# Patient Record
Sex: Male | Born: 1990
Health system: Southern US, Community
[De-identification: ages and names within clinical notes are randomized; demographics above are authoritative.]

## PROBLEM LIST (undated history)

## (undated) ENCOUNTER — Emergency Department (HOSPITAL_COMMUNITY): Admission: EM | Payer: Medicare Other | Source: Home / Self Care

## (undated) ENCOUNTER — Ambulatory Visit (HOSPITAL_COMMUNITY): Discharge status: Absent without leave | Payer: Medicare Other

## (undated) ENCOUNTER — Emergency Department (HOSPITAL_COMMUNITY): Payer: Medicare Other | Source: Home / Self Care

## (undated) DIAGNOSIS — I2699 Other pulmonary embolism without acute cor pulmonale: Secondary | ICD-10-CM

## (undated) DIAGNOSIS — G35 Multiple sclerosis: Secondary | ICD-10-CM

## (undated) DIAGNOSIS — F909 Attention-deficit hyperactivity disorder, unspecified type: Secondary | ICD-10-CM

## (undated) DIAGNOSIS — G8929 Other chronic pain: Secondary | ICD-10-CM

## (undated) DIAGNOSIS — M542 Cervicalgia: Secondary | ICD-10-CM

## (undated) DIAGNOSIS — I639 Cerebral infarction, unspecified: Secondary | ICD-10-CM

## (undated) DIAGNOSIS — M549 Dorsalgia, unspecified: Secondary | ICD-10-CM

## (undated) DIAGNOSIS — K5909 Other constipation: Secondary | ICD-10-CM

## (undated) DIAGNOSIS — F319 Bipolar disorder, unspecified: Secondary | ICD-10-CM

## (undated) DIAGNOSIS — F209 Schizophrenia, unspecified: Secondary | ICD-10-CM

## (undated) DIAGNOSIS — I10 Essential (primary) hypertension: Secondary | ICD-10-CM

## (undated) DIAGNOSIS — Z91199 Patient's noncompliance with other medical treatment and regimen due to unspecified reason: Secondary | ICD-10-CM

## (undated) DIAGNOSIS — E669 Obesity, unspecified: Secondary | ICD-10-CM

## (undated) DIAGNOSIS — Z9119 Patient's noncompliance with other medical treatment and regimen: Secondary | ICD-10-CM

## (undated) HISTORY — DX: Multiple sclerosis: G35

## (undated) HISTORY — PX: NO PAST SURGERIES: SHX2092

## (undated) HISTORY — DX: Obesity, unspecified: E66.9

## (undated) HISTORY — PX: OTHER SURGICAL HISTORY: SHX169

---
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
## 2011-01-28 ENCOUNTER — Inpatient Hospital Stay (HOSPITAL_COMMUNITY)
Admission: EM | Admit: 2011-01-28 | Discharge: 2011-02-01 | DRG: 071 | Disposition: A | Discharge status: Home / Self Care | Payer: Medicaid Other | Attending: Internal Medicine | Admitting: Internal Medicine

## 2011-01-28 ENCOUNTER — Emergency Department (HOSPITAL_COMMUNITY): Payer: Medicaid Other

## 2011-01-28 DIAGNOSIS — F39 Unspecified mood [affective] disorder: Secondary | ICD-10-CM | POA: Diagnosis present

## 2011-01-28 DIAGNOSIS — F1210 Cannabis abuse, uncomplicated: Secondary | ICD-10-CM | POA: Diagnosis present

## 2011-01-28 DIAGNOSIS — R930 Abnormal findings on diagnostic imaging of skull and head, not elsewhere classified: Secondary | ICD-10-CM | POA: Diagnosis present

## 2011-01-28 DIAGNOSIS — M6282 Rhabdomyolysis: Secondary | ICD-10-CM | POA: Diagnosis present

## 2011-01-28 DIAGNOSIS — F29 Unspecified psychosis not due to a substance or known physiological condition: Secondary | ICD-10-CM | POA: Diagnosis present

## 2011-01-28 DIAGNOSIS — R82998 Other abnormal findings in urine: Secondary | ICD-10-CM | POA: Diagnosis present

## 2011-01-28 DIAGNOSIS — G9340 Encephalopathy, unspecified: Principal | ICD-10-CM | POA: Diagnosis present

## 2011-01-28 DIAGNOSIS — F988 Other specified behavioral and emotional disorders with onset usually occurring in childhood and adolescence: Secondary | ICD-10-CM | POA: Diagnosis present

## 2011-01-28 DIAGNOSIS — E860 Dehydration: Secondary | ICD-10-CM | POA: Diagnosis present

## 2011-01-28 DIAGNOSIS — F319 Bipolar disorder, unspecified: Secondary | ICD-10-CM | POA: Diagnosis present

## 2011-01-28 DIAGNOSIS — E876 Hypokalemia: Secondary | ICD-10-CM | POA: Diagnosis present

## 2011-01-28 LAB — URINALYSIS, ROUTINE W REFLEX MICROSCOPIC
Glucose, UA: NEGATIVE mg/dL
Ketones, ur: 15 mg/dL — AB
Nitrite: NEGATIVE
Protein, ur: 100 mg/dL — AB
Specific Gravity, Urine: 1.028 (ref 1.005–1.030)
Urobilinogen, UA: 1 mg/dL (ref 0.0–1.0)
pH: 6 (ref 5.0–8.0)

## 2011-01-28 LAB — HEMOGLOBIN A1C
Hgb A1c MFr Bld: 5.9 % — ABNORMAL HIGH (ref <5.7)
Mean Plasma Glucose: 123 mg/dL — ABNORMAL HIGH (ref <117)

## 2011-01-28 LAB — RAPID URINE DRUG SCREEN, HOSP PERFORMED
Amphetamines: NOT DETECTED
Barbiturates: NOT DETECTED
Benzodiazepines: NOT DETECTED
Cocaine: NOT DETECTED
Opiates: NOT DETECTED
Tetrahydrocannabinol: POSITIVE — AB

## 2011-01-28 LAB — DIFFERENTIAL
Basophils Absolute: 0 10*3/uL (ref 0.0–0.1)
Basophils Relative: 0 % (ref 0–1)
Eosinophils Absolute: 0 10*3/uL (ref 0.0–0.7)
Eosinophils Relative: 0 % (ref 0–5)
Lymphocytes Relative: 12 % (ref 12–46)
Lymphs Abs: 1.4 10*3/uL (ref 0.7–4.0)
Monocytes Absolute: 0.9 10*3/uL (ref 0.1–1.0)
Monocytes Relative: 7 % (ref 3–12)
Neutro Abs: 10.1 10*3/uL — ABNORMAL HIGH (ref 1.7–7.7)
Neutrophils Relative %: 81 % — ABNORMAL HIGH (ref 43–77)

## 2011-01-28 LAB — COMPREHENSIVE METABOLIC PANEL
ALT: 44 U/L (ref 0–53)
AST: 23 U/L (ref 0–37)
Albumin: 4.7 g/dL (ref 3.5–5.2)
Alkaline Phosphatase: 125 U/L — ABNORMAL HIGH (ref 39–117)
BUN: 9 mg/dL (ref 6–23)
CO2: 22 mEq/L (ref 19–32)
Calcium: 10.4 mg/dL (ref 8.4–10.5)
Chloride: 102 mEq/L (ref 96–112)
Creatinine, Ser: 0.88 mg/dL (ref 0.50–1.35)
GFR calc Af Amer: >60 mL/min (ref >60)
GFR calc non Af Amer: >60 mL/min (ref >60)
Glucose, Bld: 114 mg/dL — ABNORMAL HIGH (ref 70–99)
Potassium: 3.1 mEq/L — ABNORMAL LOW (ref 3.5–5.1)
Sodium: 139 mEq/L (ref 135–145)
Total Bilirubin: 0.9 mg/dL (ref 0.3–1.2)
Total Protein: 8.4 g/dL — ABNORMAL HIGH (ref 6.0–8.3)

## 2011-01-28 LAB — URINE MICROSCOPIC-ADD ON

## 2011-01-28 LAB — CBC
HCT: 41.7 % (ref 39.0–52.0)
Hemoglobin: 13.4 g/dL (ref 13.0–17.0)
MCH: 25.1 pg — ABNORMAL LOW (ref 26.0–34.0)
MCHC: 32.1 g/dL (ref 30.0–36.0)
MCV: 78.2 fL (ref 78.0–100.0)
Platelets: 224 10*3/uL (ref 150–400)
RBC: 5.33 MIL/uL (ref 4.22–5.81)
RDW: 14 % (ref 11.5–15.5)
WBC: 12.4 10*3/uL — ABNORMAL HIGH (ref 4.0–10.5)

## 2011-01-28 LAB — PROTIME-INR
INR: 1.16 (ref 0.00–1.49)
Prothrombin Time: 15 seconds (ref 11.6–15.2)

## 2011-01-28 LAB — APTT: aPTT: 29 seconds (ref 24–37)

## 2011-01-28 LAB — ETHANOL: Alcohol, Ethyl (B): <11 mg/dL (ref 0–11)

## 2011-01-28 LAB — MRSA PCR SCREENING: MRSA by PCR: NEGATIVE

## 2011-01-28 LAB — SALICYLATE LEVEL: Salicylate Lvl: <2 mg/dL — ABNORMAL LOW (ref 2.8–20.0)

## 2011-01-28 LAB — CK: Total CK: 1120 U/L — ABNORMAL HIGH (ref 7–232)

## 2011-01-28 LAB — ACETAMINOPHEN LEVEL: Acetaminophen (Tylenol), Serum: <15 ug/mL (ref 10–30)

## 2011-01-29 ENCOUNTER — Inpatient Hospital Stay (HOSPITAL_COMMUNITY): Payer: Medicaid Other

## 2011-01-29 DIAGNOSIS — G459 Transient cerebral ischemic attack, unspecified: Secondary | ICD-10-CM

## 2011-01-29 DIAGNOSIS — R000 Tachycardia, unspecified: Secondary | ICD-10-CM

## 2011-01-29 DIAGNOSIS — R4182 Altered mental status, unspecified: Secondary | ICD-10-CM

## 2011-01-29 LAB — COMPREHENSIVE METABOLIC PANEL
ALT: 39 U/L (ref 0–53)
AST: 23 U/L (ref 0–37)
Albumin: 3.7 g/dL (ref 3.5–5.2)
Alkaline Phosphatase: 109 U/L (ref 39–117)
BUN: 7 mg/dL (ref 6–23)
CO2: 24 mEq/L (ref 19–32)
Calcium: 9.4 mg/dL (ref 8.4–10.5)
Chloride: 100 mEq/L (ref 96–112)
Creatinine, Ser: 0.71 mg/dL (ref 0.50–1.35)
GFR calc Af Amer: >60 mL/min (ref >60)
GFR calc non Af Amer: >60 mL/min (ref >60)
Glucose, Bld: 93 mg/dL (ref 70–99)
Potassium: 3 mEq/L — ABNORMAL LOW (ref 3.5–5.1)
Sodium: 136 mEq/L (ref 135–145)
Total Bilirubin: 1.4 mg/dL — ABNORMAL HIGH (ref 0.3–1.2)
Total Protein: 7.4 g/dL (ref 6.0–8.3)

## 2011-01-29 LAB — CBC
HCT: 40.2 % (ref 39.0–52.0)
Hemoglobin: 12.8 g/dL — ABNORMAL LOW (ref 13.0–17.0)
MCH: 25 pg — ABNORMAL LOW (ref 26.0–34.0)
MCHC: 31.8 g/dL (ref 30.0–36.0)
MCV: 78.5 fL (ref 78.0–100.0)
Platelets: 195 10*3/uL (ref 150–400)
RBC: 5.12 MIL/uL (ref 4.22–5.81)
RDW: 14 % (ref 11.5–15.5)
WBC: 8.7 10*3/uL (ref 4.0–10.5)

## 2011-01-29 LAB — LIPID PANEL
Cholesterol: 113 mg/dL (ref 0–169)
HDL: 33 mg/dL — ABNORMAL LOW (ref >34)
LDL Cholesterol: 70 mg/dL (ref 0–109)
Total CHOL/HDL Ratio: 3.4 RATIO
Triglycerides: 52 mg/dL (ref <150)
VLDL: 10 mg/dL (ref 0–40)
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
## 2011-01-30 LAB — BASIC METABOLIC PANEL
BUN: 8 mg/dL (ref 6–23)
CO2: 26 mEq/L (ref 19–32)
Calcium: 9.1 mg/dL (ref 8.4–10.5)
Chloride: 106 mEq/L (ref 96–112)
Creatinine, Ser: 0.86 mg/dL (ref 0.50–1.35)
GFR calc Af Amer: >60 mL/min (ref >60)
GFR calc non Af Amer: >60 mL/min (ref >60)
Glucose, Bld: 93 mg/dL (ref 70–99)
Potassium: 3.4 mEq/L — ABNORMAL LOW (ref 3.5–5.1)
Sodium: 142 mEq/L (ref 135–145)

## 2011-01-30 LAB — CBC
HCT: 40.9 % (ref 39.0–52.0)
Hemoglobin: 12.6 g/dL — ABNORMAL LOW (ref 13.0–17.0)
MCH: 24.5 pg — ABNORMAL LOW (ref 26.0–34.0)
MCHC: 30.8 g/dL (ref 30.0–36.0)
MCV: 79.4 fL (ref 78.0–100.0)
Platelets: 198 10*3/uL (ref 150–400)
RBC: 5.15 MIL/uL (ref 4.22–5.81)
RDW: 14.2 % (ref 11.5–15.5)
WBC: 6.2 10*3/uL (ref 4.0–10.5)

## 2011-01-30 LAB — URINE CULTURE
Colony Count: 15000
Culture  Setup Time: 201207251054
Special Requests: NEGATIVE

## 2011-01-30 LAB — MAGNESIUM: Magnesium: 1.9 mg/dL (ref 1.5–2.5)
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
## 2011-02-01 DIAGNOSIS — F39 Unspecified mood [affective] disorder: Secondary | ICD-10-CM

## 2011-02-02 NOTE — Discharge Summary (Signed)
NAMEJORDIN, [name]              ACCOUNT NO.:  [id number]  MEDICAL RECORD NO.:  [id number]  LOCATION:  1514                         FACILITY:  [facility]  PHYSICIAN:  Baltazar Najjar, MD     DATE OF BIRTH:  February 19, 1991  DATE OF ADMISSION:  01/28/2011 DATE OF DISCHARGE:                              DISCHARGE SUMMARY   FINAL DISCHARGE DIAGNOSES: 1. 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 2. Mood disorder/encephalopathy. 3. History of bipolar disorder. 4. Pyuria with polymicrobial growth in urine cultures, asymptomatic. 5. Morbid obesity. 6. Abnormal CT findings, stroke was ruled out by MRI.  CONSULTANTS: 1. Neurology service, neuro hospital service. 2. Eulogio Ditch, MD from Psychiatry.  RADIOLOGY AND IMAGING STUDIES: 1. Head CT January 28, 2011 showed small low-attenuation focal     subcortically in both parietal regions, worrisome for possible     subcortical infarct. 2. MRI of brain showed diffusion weight imaging, demonstrated no     evidence for acute or subacute infarction.  Examination     discontinued prior to completion due to patient intolerance.  BRIEF ADMITTING HISTORY:  Please refer to H&P for more details.  In summary, [name] is an 20 year old morbidly obese African- American man who was brought into the ER after he was found wandering on the streets and confused and with altered mental status.  The patient was also paranoid and stated that somebody put something in his drink. The patient was also noted to have been smoking marijuana, another recreational drug which was nonspecified.  Please refer to H&P for more details.  HOSPITAL COURSE:  In the emergency room, the patient was very combative. He was requiring mechanical restraints.  Initial workup in the ED including CT scan of the head as above showed findings suggestive of possible subcortical infarct.  The patient was initially admitted to ICU and stabilized for his  agitation.  He was seen by neurology service as well as psychiatric service.  He was initially placed on aspirin and workup initiated to rule out stroke that was ruled out by MRI as above. The patient also on exam did not have any neurological findings to suggest stroke.  His urinalysis showed moderate leukocytes and 11 to 20 wbc's.  Urine culture was sent that showed polymicrobial organisms.  The patient, after stabilizing and less agitated, reported no symptoms to suggest UTI.  The patient was initially covered empirically with Rocephin.  However, that will be discontinued today.  Next, the patient was seen by Dr. Rogers Blocker who put him on Seroquel and the patient is currently being transferred to inpatient psych facility for further psychiatric management.  Next, hypokalemia, the patient's potassium is being repleted.  His potassium level today has improved to 3.4.  I will give him an extra dose of potassium with potassium chloride and before discharge, his magnesium level is within normal range.  DISCHARGE MEDICATIONS: 1. Haloperidol 5 mg tablets, 10 mg p.o. q.6h. p.r.n. 2. Seroquel 100 mg p.o. q.h.s. and 100 mg p.o. twice daily with meals.  DISCHARGE INSTRUCTIONS:  The patient will be discharged to inpatient psych facility.  The patient to follow with his PCP upon discharge from there.  DISCHARGE CONDITION:  Stable medically.          ______________________________ Baltazar Najjar, MD     SA/MEDQ  D:  01/30/2011  T:  01/30/2011  Job:  [id number]  Electronically Signed by Hannah Beat MD on 02/02/2011 01:21:41 PM

## 2011-02-03 NOTE — Discharge Summary (Signed)
  NAMEGREOGORY, CORNETTE              ACCOUNT NO.:  [id number]  MEDICAL RECORD NO.:  [id number]  LOCATION:  1514                         FACILITY:  [facility]  PHYSICIAN:  Baltazar Najjar, MD     DATE OF BIRTH:  1991-07-03  DATE OF ADMISSION:  01/28/2011 DATE OF DISCHARGE:                              DISCHARGE SUMMARY   ADDENDUM.  HOSPITAL COURSE:  The patient remained stable and calm , psychiatric service was reconsulted for the reevaluation.  The patient was seen by psychiatrist on-call today who recommended no acute psych hospitalization and to discontinue one-to-one observation and stated that the patient does not need to be involuntary committed and he is stable for discharge with followup as an outpatient. This plan was discussed with his mother and Child psychotherapist . The patient will be discharged today.   Voice message left for the mother for her to call me back to update her with the above.  As per psych note, they already discussed the plan with his mother.          ______________________________ Baltazar Najjar, MD     SA/MEDQ  D:  02/01/2011  T:  02/01/2011  Job:  [id number]  Electronically Signed by Hannah Beat MD on 02/03/2011 03:20:43 PM

## 2011-07-02 ENCOUNTER — Observation Stay (HOSPITAL_COMMUNITY)
Admission: EM | Admit: 2011-07-02 | Discharge: 2011-07-04 | Disposition: A | Discharge status: Home / Self Care | Payer: Medicaid Other | Attending: Internal Medicine | Admitting: Internal Medicine

## 2011-07-02 ENCOUNTER — Encounter: Payer: Self-pay | Admitting: Emergency Medicine

## 2011-07-02 ENCOUNTER — Emergency Department (HOSPITAL_COMMUNITY): Payer: Medicaid Other

## 2011-07-02 DIAGNOSIS — R279 Unspecified lack of coordination: Principal | ICD-10-CM | POA: Insufficient documentation

## 2011-07-02 DIAGNOSIS — R531 Weakness: Secondary | ICD-10-CM

## 2011-07-02 DIAGNOSIS — E669 Obesity, unspecified: Secondary | ICD-10-CM | POA: Insufficient documentation

## 2011-07-02 DIAGNOSIS — R270 Ataxia, unspecified: Secondary | ICD-10-CM

## 2011-07-02 DIAGNOSIS — R269 Unspecified abnormalities of gait and mobility: Secondary | ICD-10-CM

## 2011-07-02 DIAGNOSIS — F259 Schizoaffective disorder, unspecified: Secondary | ICD-10-CM | POA: Diagnosis present

## 2011-07-02 DIAGNOSIS — I10 Essential (primary) hypertension: Secondary | ICD-10-CM

## 2011-07-02 DIAGNOSIS — F209 Schizophrenia, unspecified: Secondary | ICD-10-CM | POA: Insufficient documentation

## 2011-07-02 DIAGNOSIS — R42 Dizziness and giddiness: Secondary | ICD-10-CM | POA: Insufficient documentation

## 2011-07-02 DIAGNOSIS — F909 Attention-deficit hyperactivity disorder, unspecified type: Secondary | ICD-10-CM | POA: Insufficient documentation

## 2011-07-02 DIAGNOSIS — F319 Bipolar disorder, unspecified: Secondary | ICD-10-CM | POA: Insufficient documentation

## 2011-07-02 HISTORY — DX: Bipolar disorder, unspecified: F31.9

## 2011-07-02 HISTORY — DX: Attention-deficit hyperactivity disorder, unspecified type: F90.9

## 2011-07-02 HISTORY — DX: Essential (primary) hypertension: I10

## 2011-07-02 HISTORY — DX: Schizophrenia, unspecified: F20.9

## 2011-07-02 LAB — URINALYSIS, ROUTINE W REFLEX MICROSCOPIC
Bilirubin Urine: NEGATIVE
Glucose, UA: NEGATIVE mg/dL
Hgb urine dipstick: NEGATIVE
Ketones, ur: NEGATIVE mg/dL
Nitrite: NEGATIVE
Protein, ur: NEGATIVE mg/dL
Specific Gravity, Urine: 1.027 (ref 1.005–1.030)
Urobilinogen, UA: 1 mg/dL (ref 0.0–1.0)
pH: 6.5 (ref 5.0–8.0)

## 2011-07-02 LAB — CBC
HCT: 42.4 % (ref 39.0–52.0)
Hemoglobin: 13.8 g/dL (ref 13.0–17.0)
MCH: 25.9 pg — ABNORMAL LOW (ref 26.0–34.0)
MCHC: 32.5 g/dL (ref 30.0–36.0)
MCV: 79.7 fL (ref 78.0–100.0)
Platelets: 203 10*3/uL (ref 150–400)
RBC: 5.32 MIL/uL (ref 4.22–5.81)
RDW: 13.2 % (ref 11.5–15.5)
WBC: 6.2 10*3/uL (ref 4.0–10.5)

## 2011-07-02 LAB — BASIC METABOLIC PANEL
BUN: 11 mg/dL (ref 6–23)
CO2: 27 mEq/L (ref 19–32)
Calcium: 9.8 mg/dL (ref 8.4–10.5)
Chloride: 102 mEq/L (ref 96–112)
Creatinine, Ser: 0.93 mg/dL (ref 0.50–1.35)
GFR calc Af Amer: >90 mL/min (ref >90)
GFR calc non Af Amer: >90 mL/min (ref >90)
Glucose, Bld: 85 mg/dL (ref 70–99)
Potassium: 3.7 mEq/L (ref 3.5–5.1)
Sodium: 139 mEq/L (ref 135–145)

## 2011-07-02 LAB — URINE MICROSCOPIC-ADD ON

## 2011-07-02 NOTE — ED Notes (Signed)
Pt ambulated and experienced weakness in L leg to the point where we needed to give him a seat.  Pt rested for 3 minutes and was able to ambulate again.

## 2011-07-02 NOTE — ED Notes (Signed)
Mother's # Brandon [name]) number [phone] H [phone] Dossie Arbour (grandmother/caregiver).

## 2011-07-02 NOTE — ED Notes (Signed)
Per ems- Pt coming from home since Saturday he has been feeling dizzy and today has been falling.

## 2011-07-02 NOTE — ED Notes (Signed)
Phlebotomy at bedside.  Notified pt we will ambulate once blood draw is finished.  Pt compliant and has no complaints at this time

## 2011-07-02 NOTE — ED Notes (Signed)
Dr Le at bedside.  

## 2011-07-02 NOTE — ED Provider Notes (Signed)
History     CSN: [id number]  Arrival date & time 07/02/11  1648   First MD Initiated Contact with Patient 07/02/11 1750      Chief Complaint  Patient presents with  . Dizziness    (Consider location/radiation/quality/duration/timing/severity/associated sxs/prior treatment) Patient is a 20 y.o. male presenting with weakness. The history is provided by the patient.  Weakness The primary symptoms include dizziness and focal weakness. Primary symptoms do not include headaches, syncope, paresthesias, loss of sensation, fever or nausea. The symptoms began 3 to 5 days ago.  Dizziness also occurs with weakness. Dizziness does not occur with nausea.   Additional symptoms include weakness.  Per family, pt with progressive weakness over last 4 days, today fell several times. Per family pt has hx of stroke, per previous chart review, unclear if Stroke has ever been confirmed, MRI was unremarkable. Pt denies any complaints other then, states, when he stands up, "my legs just collapse." Denies fever, chills, URI symptoms, back pain, abdominal pain.   Past Medical History  Diagnosis Date  . ADHD (attention deficit hyperactivity disorder)   . Hypertension   . Schizophrenia   . Bipolar 1 disorder     No past surgical history on file.  No family history on file.  History  Substance Use Topics  . Smoking status: Not on file  . Smokeless tobacco: Not on file  . Alcohol Use:       Review of Systems  Constitutional: Positive for activity change. Negative for fever and chills.  HENT: Negative for neck pain.   Eyes: Negative.   Respiratory: Negative.   Cardiovascular: Negative.  Negative for syncope.  Gastrointestinal: Negative for nausea.  Genitourinary: Negative.   Musculoskeletal: Negative.   Neurological: Positive for dizziness, focal weakness, weakness and light-headedness. Negative for headaches and paresthesias.  Psychiatric/Behavioral: Positive for confusion and decreased  concentration.    Allergies  Review of patient's allergies indicates no known allergies.  Home Medications   Current Outpatient Rx  Name Route Sig Dispense Refill  . HALOPERIDOL 5 MG PO TABS Oral Take 5 mg by mouth 2 (two) times daily.      . QUETIAPINE FUMARATE 300 MG PO TB24 Oral Take 600 mg by mouth at bedtime.        BP 143/101  Pulse 85  Temp(Src) 97.9 F (36.6 C) (Oral)  Resp 16  SpO2 100%  Physical Exam  Nursing note and vitals reviewed. Constitutional: He is oriented to person, place, and time. He appears well-developed and well-nourished. No distress.  HENT:  Head: Normocephalic and atraumatic.  Eyes: Pupils are equal, round, and reactive to light.  Neck: Neck supple.  Cardiovascular: Normal rate and regular rhythm.   Pulmonary/Chest: Effort normal and breath sounds normal. No respiratory distress.  Abdominal: Soft. Bowel sounds are normal. He exhibits no distension.  Musculoskeletal: Normal range of motion.  Neurological: He is alert and oriented to person, place, and time.       Gait is shuffled, 5/5 and equal bilateral LE strength. Poor coordination, pt unable to do finger to nose test. No pronator drift.   Skin: Skin is warm and dry. No erythema.  Psychiatric: He has a normal mood and affect.    ED Course  Procedures (including critical care time)  Labs Reviewed  CBC - Abnormal; Notable for the following:    MCH 25.9 (*)    All other components within normal limits  BASIC METABOLIC PANEL  URINALYSIS, ROUTINE W REFLEX MICROSCOPIC   Ct  Head Wo Contrast  07/02/2011  *RADIOLOGY REPORT*  Clinical Data: Dizziness.  Recurrent falls.  CT HEAD WITHOUT CONTRAST  Technique:  Contiguous axial images were obtained from the base of the skull through the vertex without contrast.  Comparison: 01/28/2011  Findings: There is no evidence of intracranial hemorrhage, brain edema or other signs of acute infarction.  There is no evidence of intracranial mass lesion or mass  effect.  No abnormal extra-axial fluid collections are identified.  No evidence of hydrocephalus.  Mild cerebral atrophy and chronic small vessel disease again noted.  No evidence of skull fracture or other significant bone abnormality.  IMPRESSION:  1.  No acute intracranial abnormality. 2.  Stable mild cerebral atrophy and chronic small vessel disease.  Original Report Authenticated By: Danae Orleans, M.D.     No diagnosis found.  Pt ambulated in hallway, very unsteady gait. Family uncomfortable taking him home. Spoke with [facility], will admit for further evaluation and possible palcement.   MDM          Lottie Mussel, PA 07/03/11 [phone]

## 2011-07-03 ENCOUNTER — Inpatient Hospital Stay (HOSPITAL_COMMUNITY): Payer: Medicaid Other

## 2011-07-03 ENCOUNTER — Encounter (HOSPITAL_COMMUNITY): Payer: Self-pay

## 2011-07-03 DIAGNOSIS — R269 Unspecified abnormalities of gait and mobility: Secondary | ICD-10-CM

## 2011-07-03 LAB — BASIC METABOLIC PANEL
BUN: 11 mg/dL (ref 6–23)
CO2: 26 mEq/L (ref 19–32)
Calcium: 9.7 mg/dL (ref 8.4–10.5)
Chloride: 101 mEq/L (ref 96–112)
Creatinine, Ser: 0.86 mg/dL (ref 0.50–1.35)
GFR calc Af Amer: >90 mL/min (ref >90)
GFR calc non Af Amer: >90 mL/min (ref >90)
Glucose, Bld: 147 mg/dL — ABNORMAL HIGH (ref 70–99)
Potassium: 3.6 mEq/L (ref 3.5–5.1)
Sodium: 139 mEq/L (ref 135–145)

## 2011-07-03 LAB — CBC
HCT: 42.3 % (ref 39.0–52.0)
HCT: 41.1 % (ref 39.0–52.0)
Hemoglobin: 14.2 g/dL (ref 13.0–17.0)
Hemoglobin: 13.2 g/dL (ref 13.0–17.0)
MCH: 26.7 pg (ref 26.0–34.0)
MCH: 25.7 pg — ABNORMAL LOW (ref 26.0–34.0)
MCHC: 33.6 g/dL (ref 30.0–36.0)
MCHC: 32.1 g/dL (ref 30.0–36.0)
MCV: 79.7 fL (ref 78.0–100.0)
MCV: 80.1 fL (ref 78.0–100.0)
Platelets: 215 10*3/uL (ref 150–400)
Platelets: 206 10*3/uL (ref 150–400)
RBC: 5.31 MIL/uL (ref 4.22–5.81)
RBC: 5.13 MIL/uL (ref 4.22–5.81)
RDW: 13.1 % (ref 11.5–15.5)
RDW: 13.3 % (ref 11.5–15.5)
WBC: 6.4 10*3/uL (ref 4.0–10.5)
WBC: 6.1 10*3/uL (ref 4.0–10.5)

## 2011-07-03 LAB — RAPID URINE DRUG SCREEN, HOSP PERFORMED
Amphetamines: NOT DETECTED
Barbiturates: NOT DETECTED
Benzodiazepines: NOT DETECTED
Cocaine: NOT DETECTED
Opiates: NOT DETECTED
Tetrahydrocannabinol: NOT DETECTED

## 2011-07-03 LAB — CREATININE, SERUM
Creatinine, Ser: 0.97 mg/dL (ref 0.50–1.35)
GFR calc Af Amer: >90 mL/min (ref >90)
GFR calc non Af Amer: >90 mL/min (ref >90)

## 2011-07-03 LAB — VITAMIN B12: Vitamin B-12: 696 pg/mL (ref 211–911)

## 2011-07-03 LAB — ETHANOL: Alcohol, Ethyl (B): <11 mg/dL (ref 0–11)

## 2011-07-03 LAB — RPR: RPR Ser Ql: NONREACTIVE

## 2011-07-03 LAB — HEMOGLOBIN A1C
Hgb A1c MFr Bld: 5.7 % — ABNORMAL HIGH (ref <5.7)
Mean Plasma Glucose: 117 mg/dL — ABNORMAL HIGH (ref <117)

## 2011-07-03 LAB — TSH: TSH: 1.854 u[IU]/mL (ref 0.350–4.500)

## 2011-07-03 MED ORDER — QUETIAPINE FUMARATE ER 300 MG PO TB24
600.0000 mg | ORAL_TABLET | Freq: Every day | ORAL | Status: DC
  Administered 2011-07-03: 600 mg via ORAL
  Filled 2011-07-03 (×2): qty 2

## 2011-07-03 MED ORDER — ENOXAPARIN SODIUM 40 MG/0.4ML ~~LOC~~ SOLN
40.0000 mg | SUBCUTANEOUS | Status: DC
  Administered 2011-07-03 – 2011-07-04 (×2): 40 mg via SUBCUTANEOUS
  Filled 2011-07-03 (×2): qty 0.4

## 2011-07-03 MED ORDER — HALOPERIDOL 5 MG PO TABS
5.0000 mg | ORAL_TABLET | Freq: Two times a day (BID) | ORAL | Status: DC
  Administered 2011-07-03 – 2011-07-04 (×4): 5 mg via ORAL
  Filled 2011-07-03 (×5): qty 1

## 2011-07-03 NOTE — ED Notes (Signed)
Pt found up and walking to bathroom.  Returned to bed and given urinal.

## 2011-07-03 NOTE — Progress Notes (Addendum)
Physical Therapy Evaluation Patient Details Name: Brandon [name] MRN: [id number] DOB: 02/02/1991 Today's Date: 07/03/2011  Problem List:  Patient Active Problem List  Diagnoses  . OBESITY  . ADHD  . Ataxia    Past Medical History:  Past Medical History  Diagnosis Date  . ADHD (attention deficit hyperactivity disorder)   . Hypertension   . Schizophrenia   . Bipolar 1 disorder    Past Surgical History: No past surgical history on file.  PT Assessment/Plan/Recommendation PT Assessment Clinical Impression Statement: Pt is a 20 y/o male admitted with ataxic gait along with the below PT problem list.  Pt would benefit from acute PT to maximize independence and facilitate d/c home with outpatient PT to follow up with pt. PT Recommendation/Assessment: Patient will need skilled PT in the acute care venue PT Problem List: Decreased activity tolerance;Decreased balance;Decreased mobility;Decreased cognition;Decreased safety awareness Barriers to Discharge: None PT Therapy Diagnosis : Difficulty walking PT Plan PT Frequency: Min 3X/week PT Treatment/Interventions: DME instruction;Gait training;Functional mobility training;Therapeutic activities;Balance training;Patient/family education;Stair training PT Recommendation Follow Up Recommendations: Outpatient PT ([facility] if not able to have transportation to Outpatient PT) Equipment Recommended: Rolling walker with 5" wheels PT Goals  Acute Rehab PT Goals PT Goal Formulation: With patient Time For Goal Achievement: 7 days Pt will go Supine/Side to Sit: with modified independence PT Goal: Supine/Side to Sit - Progress: Not met Pt will go Sit to Supine/Side: with modified independence PT Goal: Sit to Supine/Side - Progress: Not met Pt will go Sit to Stand: with modified independence PT Goal: Sit to Stand - Progress: Not met Pt will go Stand to Sit: with modified independence PT Goal: Stand to Sit - Progress: Not met Pt will Ambulate:  >150 feet;with modified independence;with least restrictive assistive device PT Goal: Ambulate - Progress: Not met Pt will Go Up / Down Stairs: 3-5 stairs;with modified independence;with least restrictive assistive device PT Goal: Up/Down Stairs - Progress: Not met  PT Evaluation Precautions/Restrictions  Precautions Precautions: Fall Required Braces or Orthoses: No Restrictions Weight Bearing Restrictions: No Prior Functioning  Home Living Lives With: Family (Mom and brother.  ) Brandon [name] Help From: Family Type of Home: House Home Layout: One level Home Access: Stairs to enter Entrance Stairs-Rails:  (Unsure) Entrance Stairs-Number of Steps:  (Unsure) Home Adaptive Equipment: None Prior Function Level of Independence: Independent with gait;Independent with transfers;Needs assistance with ADLs;Needs assistance with homemaking Able to Take Stairs?: Yes Driving: No Cognition Cognition Arousal/Alertness: Awake/alert Overall Cognitive Status: Impaired Orientation Level: Oriented X4 Cognition - Other Comments: Pt slow to process information with difficulty problem solving. Sensation/Coordination Sensation Light Touch: Appears Intact Stereognosis: Not tested Hot/Cold: Not tested Proprioception: Not tested Coordination Gross Motor Movements are Fluid and Coordinated: Yes Fine Motor Movements are Fluid and Coordinated: Not tested Heel Shin Test: No ataxia noted with testing right or left. Extremity Assessment RUE Assessment RUE Assessment: Within Functional Limits LUE Assessment LUE Assessment: Within Functional Limits RLE Assessment RLE Assessment: Within Functional Limits LLE Assessment LLE Assessment: Within Functional Limits Pain 0/10 with treatment. Mobility (including Balance) Bed Mobility Bed Mobility: Yes Supine to Sit: 5: Supervision;HOB flat Supine to Sit Details (indicate cue type and reason): Verbal cues for sequence.  Several trials secondary to pt would  not staying sitting EOB for a prolonged amount of time. Sitting - Scoot to Edge of Bed: 5: Supervision Sitting - Scoot to [place] of Bed Details (indicate cue type and reason): Verbal cues to keep trunk anterior over BOS with pt tending  to lean posterior. Sit to Supine - Right: 5: Supervision;HOB flat Sit to Supine - Right Details (indicate cue type and reason): Verbal cues for safety.  Attempting to remain sitting EOB, but pt continually laying back down even with cues not to lay down. Transfers Transfers: Yes Sit to Stand: 1: +2 Total assist;Patient percentage (comment);From bed ((pt=80%); 2 trials.) Sit to Stand Details (indicate cue type and reason): Assist for balance and safety.  Pt with impulsive behavior limiting safety.  Cues for hand placement and safety with standing. Stand to Sit: 1: +2 Total assist;Patient percentage (comment);To bed;To chair/3-in-1;With upper extremity assist ((pt=80%); 2 trials.) Stand to Sit Details: Assist to slow descent to chair with cues for hand placement and safety.  Impulsive behavior causes pt to sit without warning. Ambulation/Gait Ambulation/Gait: Yes Ambulation/Gait Assistance: 1: +2 Total assist;Patient percentage (comment) ((pt=85%)) Ambulation/Gait Assistance Details (indicate cue type and reason): Assist for safety secondary to impulsive behavior.  Required chair follow due to decreased safety.  Assist also for slight imbalance with gait. Ambulation Distance (Feet): 120 Feet Assistive device: Rolling walker Gait Pattern: Step-through pattern;Decreased step length - right;Decreased step length - left (Increased bilateral lateral excursion with gait.) Stairs: No Wheelchair Mobility Wheelchair Mobility: No  Posture/Postural Control Posture/Postural Control: No significant limitations Balance Balance Assessed: No End of Session PT - End of Session Equipment Utilized During Treatment: Gait belt Activity Tolerance: Patient tolerated treatment  well Patient left: in chair;with call bell in reach Nurse Communication: Mobility status for transfers;Mobility status for ambulation General Behavior During Session: Brandon [name] for tasks performed Cognition: Impaired Cognitive Impairment: Pt with impulsive behavior causing decreased safety and balance.  Brandon [name] M 07/03/2011, 1:31 PM  07/03/2011 Cephus Shelling, PT, DPT [phone]

## 2011-07-03 NOTE — Consult Note (Signed)
[facility] CONSULT NOTE     Reason for Consult: ataxia and AMS CC: ataxia and AMS   HPI:    Brandon [name] is an 20 y.o. male  has a past medical history of ADHD (attention deficit hyperactivity disorder); Hypertension; Schizophrenia; and Bipolar 1 disorder. Patient is minimally talkative but able to tell me noted at 1pm Saturday he felt his legs were giving out on him and felt unsteady.  He was brought to the hospital. CT head is negative.  Patient denies any numbness, tingling, recent falls or neck injury,  He denies any speech issues.    Past Medical History  Diagnosis Date  . ADHD (attention deficit hyperactivity disorder)   . Hypertension   . Schizophrenia   . Bipolar 1 disorder     No past surgical history on file.  No family history on file.  Social History:  reports that he has never smoked. He does not have any smokeless tobacco history on file. His alcohol and drug histories not on file.  No Known Allergies  Medications:    Prior to Admission:  Prescriptions prior to admission  Medication Sig Dispense Refill  . haloperidol (HALDOL) 5 MG tablet Take 5 mg by mouth 2 (two) times daily.        . QUEtiapine (SEROQUEL XR) 300 MG 24 hr tablet Take 600 mg by mouth at bedtime.         Scheduled:   . enoxaparin  40 mg Subcutaneous Q24H  . haloperidol  5 mg Oral BID  . QUEtiapine  600 mg Oral QHS    Review of Systems - General ROS: negative for - chills, fatigue, fever or hot flashes Hematological and Lymphatic ROS: negative for - bruising, fatigue, jaundice or pallor Endocrine ROS: negative for - hair pattern changes, hot flashes, mood swings or skin changes Respiratory ROS: negative for - cough, hemoptysis, orthopnea or wheezing Cardiovascular ROS: negative for - dyspnea on exertion, orthopnea, palpitations or shortness of breath Gastrointestinal ROS: negative for - abdominal pain, appetite loss, blood in stools, diarrhea or  hematemesis Musculoskeletal ROS: negative for - joint pain, joint stiffness, joint swelling or muscle pain Neurological ROS: Please see above Dermatological ROS: negative for dry skin, pruritus and rash   Blood pressure 119/73, pulse 69, temperature 97.8 F (36.6 C), temperature source Oral, resp. rate 20, height 6\' 3"  (1.905 m), weight 160.2 kg (353 lb 2.8 oz), SpO2 98.00%.   Neurologic Examination:   Mental Status: Alert, oriented, thought content appropriate.  Speech fluent without evidence of aphasia. Able to follow 3 step commands without difficulty. Cranial Nerves: II-Visual fields grossly intact. III/IV/VI-Extraocular movements intact.  Pupils reactive bilaterally. V/VII-Smile symmetric VIII-grossly intact IX/X-normal gag XI-bilateral shoulder shrug XII-midline tongue extension Motor: 5/5 bilaterally with normal tone and bulk Sensory: Pinprick and light touch intact throughout, bilaterally, NO sensory level on exam. Deep Tendon Reflexes: 2+ and symmetric throughout Plantars: Downgoing bilaterally Cerebellar: Normal finger-to-nose, normal rapid alternating movements and normal heel-to-shin test.  Normal gait and station.     Lab Results  Component Value Date/Time   CHOL 113 01/29/2011  3:10 AM    Results for orders placed during the hospital encounter of 07/02/11 (from the past 48 hour(s))  CBC     Status: Abnormal   Collection Time   07/02/11  8:03 PM      Component Value Range  Comment   WBC 6.2  4.0 - 10.5 (K/uL)    RBC 5.32  4.22 - 5.81 (MIL/uL)    Hemoglobin 13.8  13.0 - 17.0 (g/dL)    HCT 16.1  09.6 - 04.5 (%)    MCV 79.7  78.0 - 100.0 (fL)    MCH 25.9 (*) 26.0 - 34.0 (pg)    MCHC 32.5  30.0 - 36.0 (g/dL)    RDW 40.9  81.1 - 91.4 (%)    Platelets 203  150 - 400 (K/uL)   BASIC METABOLIC PANEL     Status: Normal   Collection Time   07/02/11  8:03 PM      Component Value Range Comment   Sodium 139  135 - 145 (mEq/L)    Potassium 3.7  3.5 - 5.1 (mEq/L)     Chloride 102  96 - 112 (mEq/L)    CO2 27  19 - 32 (mEq/L)    Glucose, Bld 85  70 - 99 (mg/dL)    BUN 11  6 - 23 (mg/dL)    Creatinine, Ser 7.82  0.50 - 1.35 (mg/dL)    Calcium 9.8  8.4 - 10.5 (mg/dL)    GFR calc non Af Amer >90  >90 (mL/min)    GFR calc Af Amer >90  >90 (mL/min)   URINALYSIS, ROUTINE W REFLEX MICROSCOPIC     Status: Abnormal   Collection Time   07/02/11  8:13 PM      Component Value Range Comment   Color, Urine YELLOW  YELLOW     APPearance CLEAR  CLEAR     Specific Gravity, Urine 1.027  1.005 - 1.030     pH 6.5  5.0 - 8.0     Glucose, UA NEGATIVE  NEGATIVE (mg/dL)    Hgb urine dipstick NEGATIVE  NEGATIVE     Bilirubin Urine NEGATIVE  NEGATIVE     Ketones, ur NEGATIVE  NEGATIVE (mg/dL)    Protein, ur NEGATIVE  NEGATIVE (mg/dL)    Urobilinogen, UA 1.0  0.0 - 1.0 (mg/dL)    Nitrite NEGATIVE  NEGATIVE     Leukocytes, UA TRACE (*) NEGATIVE    URINE MICROSCOPIC-ADD ON     Status: Normal   Collection Time   07/02/11  8:13 PM      Component Value Range Comment   Squamous Epithelial / LPF RARE  RARE     WBC, UA 3-6  <3 (WBC/hpf)   CBC     Status: Normal   Collection Time   07/03/11  2:27 AM      Component Value Range Comment   WBC 6.4  4.0 - 10.5 (K/uL)    RBC 5.31  4.22 - 5.81 (MIL/uL)    Hemoglobin 14.2  13.0 - 17.0 (g/dL)    HCT 95.6  21.3 - 08.6 (%)    MCV 79.7  78.0 - 100.0 (fL)    MCH 26.7  26.0 - 34.0 (pg)    MCHC 33.6  30.0 - 36.0 (g/dL)    RDW 57.8  46.9 - 62.9 (%)    Platelets 215  150 - 400 (K/uL)   CREATININE, SERUM     Status: Normal   Collection Time   07/03/11  2:27 AM      Component Value Range Comment   Creatinine, Ser 0.97  0.50 - 1.35 (mg/dL)    GFR calc non Af Amer >90  >90 (mL/min)    GFR calc Af Amer >90  >90 (mL/min)   CBC  Status: Abnormal   Collection Time   07/03/11  6:00 AM      Component Value Range Comment   WBC 6.1  4.0 - 10.5 (K/uL)    RBC 5.13  4.22 - 5.81 (MIL/uL)    Hemoglobin 13.2  13.0 - 17.0 (g/dL)    HCT 16.1   09.6 - 04.5 (%)    MCV 80.1  78.0 - 100.0 (fL)    MCH 25.7 (*) 26.0 - 34.0 (pg)    MCHC 32.1  30.0 - 36.0 (g/dL)    RDW 40.9  81.1 - 91.4 (%)    Platelets 206  150 - 400 (K/uL)   BASIC METABOLIC PANEL     Status: Abnormal   Collection Time   07/03/11  6:00 AM      Component Value Range Comment   Sodium 139  135 - 145 (mEq/L)    Potassium 3.6  3.5 - 5.1 (mEq/L)    Chloride 101  96 - 112 (mEq/L)    CO2 26  19 - 32 (mEq/L)    Glucose, Bld 147 (*) 70 - 99 (mg/dL)    BUN 11  6 - 23 (mg/dL)    Creatinine, Ser 7.82  0.50 - 1.35 (mg/dL)    Calcium 9.7  8.4 - 10.5 (mg/dL)    GFR calc non Af Amer >90  >90 (mL/min)    GFR calc Af Amer >90  >90 (mL/min)     Ct Head Wo Contrast  07/02/2011  *RADIOLOGY REPORT*  Clinical Data: Dizziness.  Recurrent falls.  CT HEAD WITHOUT CONTRAST  Technique:  Contiguous axial images were obtained from the base of the skull through the vertex without contrast.  Comparison: 01/28/2011  Findings: There is no evidence of intracranial hemorrhage, brain edema or other signs of acute infarction.  There is no evidence of intracranial mass lesion or mass effect.  No abnormal extra-axial fluid collections are identified.  No evidence of hydrocephalus.  Mild cerebral atrophy and chronic small vessel disease again noted.  No evidence of skull fracture or other significant bone abnormality.  IMPRESSION:  1.  No acute intracranial abnormality. 2.  Stable mild cerebral atrophy and chronic small vessel disease.  Original Report Authenticated By: Danae Orleans, M.D.     Assessment/Plan:   20 YO male with onset of bilateral leg weakness and sensation of unsteady gait since Saturday.  On exam he shows no weakness, no focality and able to ambulate with assistance well.  Most likely physicogenic however agree with obtaining MRI of head.    (1) Recommend he continue PT for gait (2) Recommend he be one person assist when out of bed (3) He is to heavy for MRI table, Recommend he have CT  neck and lumbar spine   Felicie Morn PA-C [facility] [phone]  07/03/2011, 9:50 AM
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
## 2011-07-03 NOTE — H&P (Signed)
PCP:   No primary provider on file.   Chief Complaint: Unsteadiness.   HPI: Brandon [name] is an 20 y.o. male with history of schizophrenia, bipolar disorder, hypertension, ADD, brought in by his grandparents complaining that he has been unsteady in his gait. He was recently admitted for the same, and reportedly had a workup including an MRI of his brain, which was only partially completed showing no abnormality. Family reports that he has  inappropriate emotion, always laughing and thinking everything is comical. I noted that he has been on Seroquel at 300 mg per day along with Haldol 5 mg each bedtime.  Workup in the emergency room included a CT of his head which was unremarkable. Because of his ataxia, and inability to try to ambulate, hospitalist was asked to admit the patient since family cannot care for him adequately.  Rewiew of Systems:  The patient denies anorexia, fever, weight loss,, vision loss, decreased hearing, hoarseness, chest pain, syncope, dyspnea on exertion, peripheral edema, balance deficits, hemoptysis, abdominal pain, melena, hematochezia, severe indigestion/heartburn, hematuria, incontinence, genital sores, muscle weakness, suspicious skin lesions, transient blindness, depression, unusual weight change, abnormal bleeding, enlarged lymph nodes, angioedema, and breast masses.   Past Medical History  Diagnosis Date  . ADHD (attention deficit hyperactivity disorder)   . Hypertension   . Schizophrenia   . Bipolar 1 disorder     No past surgical history on file.  Medications:  HOME MEDS: Prior to Admission medications   Medication Sig Start Date End Date Taking? Authorizing Provider  haloperidol (HALDOL) 5 MG tablet Take 5 mg by mouth 2 (two) times daily.     Yes Historical Provider, MD  QUEtiapine (SEROQUEL XR) 300 MG 24 hr tablet Take 600 mg by mouth at bedtime.     Yes Historical Provider, MD     Allergies:  No Known Allergies  Social History:   does not  have a smoking history on file. He does not have any smokeless tobacco history on file. His alcohol and drug histories not on file.  Family History: No family history on file.   Physical Exam: Filed Vitals:   07/02/11 1901 07/02/11 2000 07/02/11 2153 07/03/11 0047  BP: 143/101 148/119 139/86   Pulse: 85 75 72   Temp:    97.5 F (36.4 C)  TempSrc:    Oral  Resp: 16  18   SpO2: 100% 100% 100%    Blood pressure 139/86, pulse 72, temperature 97.5 F (36.4 C), temperature source Oral, resp. rate 18, SpO2 100.00%.  GEN:  Pleasant  person lying in the stretcher in no acute distress; cooperative with exam.   PSYCH:  alert and oriented x4; does not appear anxious does not appear depressed; but with clear inappropriate affect. He thinks everything is comical, and laugh at inappropriate things HEENT: Mucous membranes pink and anicteric; PERRLA; EOM intact; no cervical lymphadenopathy nor thyromegaly or carotid bruit; no JVD; Breasts:: Not examined CHEST WALL: No tenderness CHEST: Normal respiration, clear to auscultation bilaterally HEART: Regular rate and rhythm; no murmurs rubs or gallops BACK: No kyphosis or scoliosis; no CVA tenderness ABDOMEN: Obese, soft non-tender; no masses, no organomegaly, normal abdominal bowel sounds; no pannus; no intertriginous candida. Rectal Exam: Not done EXTREMITIES: No bone or joint deformity; age-appropriate arthropathy of the hands and knees; no edema; no ulcerations. Genitalia: not examined PULSES: 2+ and symmetric SKIN: Normal hydration no rash or ulceration CNS: Cranial nerves 2-12 grossly intact no focal neurologic deficit. He does have some gait  shuffling, and ataxia.   Labs & Imaging Results for orders placed during the hospital encounter of 07/02/11 (from the past 48 hour(s))  CBC     Status: Abnormal   Collection Time   07/02/11  8:03 PM      Component Value Range Comment   WBC 6.2  4.0 - 10.5 (K/uL)    RBC 5.32  4.22 - 5.81 (MIL/uL)     Hemoglobin 13.8  13.0 - 17.0 (g/dL)    HCT 16.1  09.6 - 04.5 (%)    MCV 79.7  78.0 - 100.0 (fL)    MCH 25.9 (*) 26.0 - 34.0 (pg)    MCHC 32.5  30.0 - 36.0 (g/dL)    RDW 40.9  81.1 - 91.4 (%)    Platelets 203  150 - 400 (K/uL)   BASIC METABOLIC PANEL     Status: Normal   Collection Time   07/02/11  8:03 PM      Component Value Range Comment   Sodium 139  135 - 145 (mEq/L)    Potassium 3.7  3.5 - 5.1 (mEq/L)    Chloride 102  96 - 112 (mEq/L)    CO2 27  19 - 32 (mEq/L)    Glucose, Bld 85  70 - 99 (mg/dL)    BUN 11  6 - 23 (mg/dL)    Creatinine, Ser 7.82  0.50 - 1.35 (mg/dL)    Calcium 9.8  8.4 - 10.5 (mg/dL)    GFR calc non Af Amer >90  >90 (mL/min)    GFR calc Af Amer >90  >90 (mL/min)   URINALYSIS, ROUTINE W REFLEX MICROSCOPIC     Status: Abnormal   Collection Time   07/02/11  8:13 PM      Component Value Range Comment   Color, Urine YELLOW  YELLOW     APPearance CLEAR  CLEAR     Specific Gravity, Urine 1.027  1.005 - 1.030     pH 6.5  5.0 - 8.0     Glucose, UA NEGATIVE  NEGATIVE (mg/dL)    Hgb urine dipstick NEGATIVE  NEGATIVE     Bilirubin Urine NEGATIVE  NEGATIVE     Ketones, ur NEGATIVE  NEGATIVE (mg/dL)    Protein, ur NEGATIVE  NEGATIVE (mg/dL)    Urobilinogen, UA 1.0  0.0 - 1.0 (mg/dL)    Nitrite NEGATIVE  NEGATIVE     Leukocytes, UA TRACE (*) NEGATIVE    URINE MICROSCOPIC-ADD ON     Status: Normal   Collection Time   07/02/11  8:13 PM      Component Value Range Comment   Squamous Epithelial / LPF RARE  RARE     WBC, UA 3-6  <3 (WBC/hpf)    Ct Head Wo Contrast  07/02/2011  *RADIOLOGY REPORT*  Clinical Data: Dizziness.  Recurrent falls.  CT HEAD WITHOUT CONTRAST  Technique:  Contiguous axial images were obtained from the base of the skull through the vertex without contrast.  Comparison: 01/28/2011  Findings: There is no evidence of intracranial hemorrhage, brain edema or other signs of acute infarction.  There is no evidence of intracranial mass lesion or mass  effect.  No abnormal extra-axial fluid collections are identified.  No evidence of hydrocephalus.  Mild cerebral atrophy and chronic small vessel disease again noted.  No evidence of skull fracture or other significant bone abnormality.  IMPRESSION:  1.  No acute intracranial abnormality. 2.  Stable mild cerebral atrophy and chronic small vessel disease.  Original  Report Authenticated By: Danae Orleans, M.D.      Assessment Present on Admission:  .Ataxia Schizophrenia ADD  PLAN:  I believe Mr. Supinski has psychiatric disorder that inhibits him to do his daily activities. 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  It is possible that he is experiencing anti-dopaminergic effect of his antipsychotics. I have elected to continue him on the same medications at the same dose for now, but a psychiatric re-evaluation to determine his pharmacological treatment may be helpful. Since the family cannot take care of him, I will admit him to the hospital with a goal of seeking short-term rehabilitation for him. I discussed this with his mother and his grandparents as well. Consider neurological consult for his ataxia. He is stable, full code, and will be admitted to triad hospitalist service.  Other plans as per orders.    Fayette Hamada 07/03/2011, 1:21 AM

## 2011-07-03 NOTE — Progress Notes (Signed)
Subjective: Denies any complaints, still complaining of gait weakness  Objective: Vital signs in last 24 hours: Filed Vitals:   07/03/11 0200 07/03/11 0500 07/03/11 0600 07/03/11 1045  BP: 129/78  119/73 141/99  Pulse: 90  69 91  Temp: 97.5 F (36.4 C)  97.8 F (36.6 C) 98.3 F (36.8 C)  TempSrc: Oral  Oral Oral  Resp: 19  20 22   Height: 6\' 3"  (1.905 m)     Weight: 159.9 kg (352 lb 8.3 oz) 160.2 kg (353 lb 2.8 oz)    SpO2: 97%  98% 99%    Intake/Output Summary (Last 24 hours) at 07/03/11 1228 Last data filed at 07/03/11 0300  Gross per 24 hour  Intake      0 ml  Output    500 ml  Net   -500 ml    Weight change:   General: Oriented to to self and place , in no acute distress. HEENT: No bruits, no goiter. Heart: Regular rate and rhythm, without murmurs, rubs, gallops. Lungs: Clear to auscultation bilaterally. Abdomen: Soft, nontender, nondistended, positive bowel sounds. Extremities: No clubbing cyanosis or edema with positive pedal pulses. Neuro: Grossly intact, nonfocal.    Lab Results: Results for orders placed during the hospital encounter of 07/02/11 (from the past 24 hour(s))  CBC     Status: Abnormal   Collection Time   07/02/11  8:03 PM      Component Value Range   WBC 6.2  4.0 - 10.5 (K/uL)   RBC 5.32  4.22 - 5.81 (MIL/uL)   Hemoglobin 13.8  13.0 - 17.0 (g/dL)   HCT 40.9  81.1 - 91.4 (%)   MCV 79.7  78.0 - 100.0 (fL)   MCH 25.9 (*) 26.0 - 34.0 (pg)   MCHC 32.5  30.0 - 36.0 (g/dL)   RDW 78.2  95.6 - 21.3 (%)   Platelets 203  150 - 400 (K/uL)  BASIC METABOLIC PANEL     Status: Normal   Collection Time   07/02/11  8:03 PM      Component Value Range   Sodium 139  135 - 145 (mEq/L)   Potassium 3.7  3.5 - 5.1 (mEq/L)   Chloride 102  96 - 112 (mEq/L)   CO2 27  19 - 32 (mEq/L)   Glucose, Bld 85  70 - 99 (mg/dL)   BUN 11  6 - 23 (mg/dL)   Creatinine, Ser 0.86  0.50 - 1.35 (mg/dL)   Calcium 9.8  8.4 - 57.8 (mg/dL)   GFR calc non Af Amer >90  >90  (mL/min)   GFR calc Af Amer >90  >90 (mL/min)  URINALYSIS, ROUTINE W REFLEX MICROSCOPIC     Status: Abnormal   Collection Time   07/02/11  8:13 PM      Component Value Range   Color, Urine YELLOW  YELLOW    APPearance CLEAR  CLEAR    Specific Gravity, Urine 1.027  1.005 - 1.030    pH 6.5  5.0 - 8.0    Glucose, UA NEGATIVE  NEGATIVE (mg/dL)   Hgb urine dipstick NEGATIVE  NEGATIVE    Bilirubin Urine NEGATIVE  NEGATIVE    Ketones, ur NEGATIVE  NEGATIVE (mg/dL)   Protein, ur NEGATIVE  NEGATIVE (mg/dL)   Urobilinogen, UA 1.0  0.0 - 1.0 (mg/dL)   Nitrite NEGATIVE  NEGATIVE    Leukocytes, UA TRACE (*) NEGATIVE   URINE MICROSCOPIC-ADD ON     Status: Normal   Collection Time  07/02/11  8:13 PM      Component Value Range   Squamous Epithelial / LPF RARE  RARE    WBC, UA 3-6  <3 (WBC/hpf)  CBC     Status: Normal   Collection Time   07/03/11  2:27 AM      Component Value Range   WBC 6.4  4.0 - 10.5 (K/uL)   RBC 5.31  4.22 - 5.81 (MIL/uL)   Hemoglobin 14.2  13.0 - 17.0 (g/dL)   HCT 09.8  11.9 - 14.7 (%)   MCV 79.7  78.0 - 100.0 (fL)   MCH 26.7  26.0 - 34.0 (pg)   MCHC 33.6  30.0 - 36.0 (g/dL)   RDW 82.9  56.2 - 13.0 (%)   Platelets 215  150 - 400 (K/uL)  CREATININE, SERUM     Status: Normal   Collection Time   07/03/11  2:27 AM      Component Value Range   Creatinine, Ser 0.97  0.50 - 1.35 (mg/dL)   GFR calc non Af Amer >90  >90 (mL/min)   GFR calc Af Amer >90  >90 (mL/min)  CBC     Status: Abnormal   Collection Time   07/03/11  6:00 AM      Component Value Range   WBC 6.1  4.0 - 10.5 (K/uL)   RBC 5.13  4.22 - 5.81 (MIL/uL)   Hemoglobin 13.2  13.0 - 17.0 (g/dL)   HCT 86.5  78.4 - 69.6 (%)   MCV 80.1  78.0 - 100.0 (fL)   MCH 25.7 (*) 26.0 - 34.0 (pg)   MCHC 32.1  30.0 - 36.0 (g/dL)   RDW 29.5  28.4 - 13.2 (%)   Platelets 206  150 - 400 (K/uL)  BASIC METABOLIC PANEL     Status: Abnormal   Collection Time   07/03/11  6:00 AM      Component Value Range   Sodium 139  135 -  145 (mEq/L)   Potassium 3.6  3.5 - 5.1 (mEq/L)   Chloride 101  96 - 112 (mEq/L)   CO2 26  19 - 32 (mEq/L)   Glucose, Bld 147 (*) 70 - 99 (mg/dL)   BUN 11  6 - 23 (mg/dL)   Creatinine, Ser 4.40  0.50 - 1.35 (mg/dL)   Calcium 9.7  8.4 - 10.2 (mg/dL)   GFR calc non Af Amer >90  >90 (mL/min)   GFR calc Af Amer >90  >90 (mL/min)  URINE RAPID DRUG SCREEN (HOSP PERFORMED)     Status: Normal   Collection Time   07/03/11 10:50 AM      Component Value Range   Opiates NONE DETECTED  NONE DETECTED    Cocaine NONE DETECTED  NONE DETECTED    Benzodiazepines NONE DETECTED  NONE DETECTED    Amphetamines NONE DETECTED  NONE DETECTED    Tetrahydrocannabinol NONE DETECTED  NONE DETECTED    Barbiturates NONE DETECTED  NONE DETECTED      Micro: No results found for this or any previous visit (from the past 240 hour(s)).  Studies/Results: Ct Head Wo Contrast  07/02/2011  *RADIOLOGY REPORT*  Clinical Data: Dizziness.  Recurrent falls.  CT HEAD WITHOUT CONTRAST  Technique:  Contiguous axial images were obtained from the base of the skull through the vertex without contrast.  Comparison: 01/28/2011  Findings: There is no evidence of intracranial hemorrhage, brain edema or other signs of acute infarction.  There is no evidence of intracranial mass lesion  or mass effect.  No abnormal extra-axial fluid collections are identified.  No evidence of hydrocephalus.  Mild cerebral atrophy and chronic small vessel disease again noted.  No evidence of skull fracture or other significant bone abnormality.  IMPRESSION:  1.  No acute intracranial abnormality. 2.  Stable mild cerebral atrophy and chronic small vessel disease.  Original Report Authenticated By: Danae Orleans, M.D.    Medications:  Scheduled Meds:   . enoxaparin  40 mg Subcutaneous Q24H  . haloperidol  5 mg Oral BID  . QUEtiapine  600 mg Oral QHS   Continuous Infusions:  PRN Meds:.   Assessment: Ataxia  Schizophrenia  ADD   Plan:  We will  obtain a neurology consultation, check a TSH, vitamin B 12, an RPR. We will do an MRI of the brain. An inpatient psychiatric consultation has been obtained to review his psychiatric medications. Continue PT OT evaluation.  LOS: 1 day   [facility] 07/03/2011, 12:28 PM

## 2011-07-03 NOTE — Consult Note (Signed)
Patient Identification:  [name] Date of Evaluation:  07/03/2011   History of Present Illness:  NATION CRADLE is an 20 y.o. male with history of schizophrenia, bipolar disorder, hypertension, ADD, brought in by his grandparents complaining that he has been unsteady in his gait. He was recently admitted for the same, and reportedly had a workup including an MRI of his brain, which was only partially completed showing no abnormality. Family reports that he has inappropriate emotion, always laughing and thinking everything is comical. workup in the emergency room included a CT of his head which was unremarkable.  Patient has a long history off ADHD since the age of 5 as per patient and after a few years he was diagnosed with a bipolar disorder. Patient denied any admission to the state hospital but was admitted number of times in acute crisis stabilization hospital. Most recently at [facility] in September 2012. He is following at [facility]. Patient denies any history of suicide attempt in the past denies history of illicit drug abuse or alcohol abuse. He reported sleep and appetite fair told me he is on Seroquel 600 mg at bedtime and Haldol 5 mg twice a day and both medications are helping him he denies hearing voices. Patient's mood is euthymic but his affect is blunted. Patient was not forthcoming in providing information giving concrete answers. He is alert awake oriented x3 attention and concentration poor abstraction ability fair memory fair to poor insight and judgment fair.  As per collateral information  Mom reports she has noticed patient has been more paranoid the last few weeks with regards to hearing voices. Patient will ask mom if she hears all the voices and mom redirects patient and explains there are no voices. Mom reports patient has been upset with the dogs talking about him. Patient has a dog and so does the next door neighbor. Patient becomes upset and agitated because he  thinks the dogs are laughing and calling out his name. Again mom re-directs. Reports no new stressors or problems. Reports mood has been stable for patient and no manic episodes or depression. Also reports no SI. Mom reports poor sleep, but appeitite is fair. Main concern is the falling and weakness and wanting neurology involved to rule out and neurological problems. Mom is also wanting a PTOT consult and discussion of home health aide and medical equipment.   Past Medical History:     Past Medical History  Diagnosis Date  . ADHD (attention deficit hyperactivity disorder)   . Hypertension   . Schizophrenia   . Bipolar 1 disorder       No past surgical history on file.  Filed Vitals:   07/03/11 1437  BP: 157/96  Pulse: 73  Temp: 98.2 F (36.8 C)  Resp: 22    Lab Results:   BMET    Component Value Date/Time   NA 139 07/03/2011 0600   K 3.6 07/03/2011 0600   CL 101 07/03/2011 0600   CO2 26 07/03/2011 0600   GLUCOSE 147* 07/03/2011 0600   BUN 11 07/03/2011 0600   CREATININE 0.86 07/03/2011 0600   CALCIUM 9.7 07/03/2011 0600   GFRNONAA >90 07/03/2011 0600   GFRAA >90 07/03/2011 0600    Allergies: No Known Allergies  Current Medications:  Prior to Admission medications   Medication Sig Start Date End Date Taking? Authorizing Provider  haloperidol (HALDOL) 5 MG tablet Take 5 mg by mouth 2 (two) times daily.     Yes Historical Provider, MD  QUEtiapine (SEROQUEL XR) 300 MG 24 hr tablet Take 600 mg by mouth at bedtime.     Yes Historical Provider, MD    Social History:    reports that he has never smoked. He does not have any smokeless tobacco history on file. His alcohol and drug histories not on file.   Family History:    No family history on file.   DIAGNOSIS:   AXIS I  schizoaffective disorder   AXIS II  Deffered  AXIS III See medical notes.  AXIS IV  chronic mental health issues   AXIS V 45     Recommendations:  Patient can be continued on Seroquel  and Haldol. CSW to get lost discharge summary from old [facility]. Will followup as needed    Eulogio Ditch, MD

## 2011-07-04 DIAGNOSIS — F259 Schizoaffective disorder, unspecified: Secondary | ICD-10-CM | POA: Diagnosis present

## 2011-07-04 DIAGNOSIS — I10 Essential (primary) hypertension: Secondary | ICD-10-CM | POA: Diagnosis present

## 2011-07-04 MED ORDER — WHITE PETROLATUM GEL
Status: AC
  Filled 2011-07-04: qty 5

## 2011-07-04 MED ORDER — LISINOPRIL 10 MG PO TABS
10.0000 mg | ORAL_TABLET | Freq: Every day | ORAL | Status: DC
  Filled 2011-07-04 (×2): qty 1

## 2011-07-04 MED ORDER — LISINOPRIL 10 MG PO TABS: 10.0000 mg | ORAL_TABLET | Freq: Every day | ORAL | Status: DC

## 2011-07-04 NOTE — Progress Notes (Addendum)
[facility] PROGRESS NOTE    SUBJECTIVE   States he feels stronger today, was able to walk without assistance 50 yards yesterday.   OBJECTIVE   Vital signs in last 24 hours: Temp:  [97.9 F (36.6 C)-98.3 F (36.8 C)] 97.9 F (36.6 C) (12/28 0511) Pulse Rate:  [73-102] 102  (12/28 0511) Resp:  [20-22] 22  (12/28 0511) BP: (126-157)/(82-99) 126/82 mmHg (12/28 0511) SpO2:  [94 %-100 %] 94 % (12/28 0511) Weight:  [160.8 kg (354 lb 8 oz)] 354 lb 8 oz (160.8 kg) (12/28 0511)  Intake/Output from previous day:   Intake/Output this shift:   Nutritional status: Cardiac  Past Medical History  Diagnosis Date  . ADHD (attention deficit hyperactivity disorder)   . Hypertension   . Schizophrenia   . Bipolar 1 disorder     Neurologic Exam:   Mental Status: Alert, oriented, thought content appropriate.  Speech fluent without evidence of aphasia. Able to follow 3 step commands without difficulty. Cranial Nerves: II-Visual fields grossly intact. III/IV/VI-Extraocular movements intact.  Pupils reactive bilaterally. V/VII-Smile symmetric VIII-grossly intact IX/X-normal gag XI-bilateral shoulder shrug XII-midline tongue extension Motor: 5/5 bilaterally with normal tone and bulk Sensory: Pinprick and light touch intact throughout, bilaterally Deep Tendon Reflexes: 2+ and symmetric throughout Plantars: Downgoing bilaterally Cerebellar: Normal finger-to-nose, normal rapid alternating movements and normal heel-to-shin test.  Normal gait with use of walker (patient places very little weight on walker when walking and supports his weight without difficulty).    Lab Results: Lab Results  Component Value Date/Time   CHOL 113 01/29/2011  3:10 AM   Lipid Panel No results found for this basename: CHOL,TRIG,HDL,CHOLHDL,VLDL,LDLCALC in the last 72 hours  Studies/Results: Dg Chest 2 View  07/03/2011  *RADIOLOGY REPORT*  Clinical Data:  Mass, pneumonia.  CHEST - 2 VIEW  Comparison: None.  Findings: Image quality is degraded by body habitus.  Trachea is midline.  Heart size is grossly within normal limits.  Lungs are low in volume but grossly clear.  No definite pleural fluid.  IMPRESSION:  1.  Image quality is degraded by body habitus. 2.  No definite acute findings.  Original Report Authenticated By: Reyes Ivan, M.D.   Ct Head Wo Contrast  07/02/2011  *RADIOLOGY REPORT*  Clinical Data: Dizziness.  Recurrent falls.  CT HEAD WITHOUT CONTRAST  Technique:  Contiguous axial images were obtained from the base of the skull through the vertex without contrast.  Comparison: 01/28/2011  Findings: There is no evidence of intracranial hemorrhage, brain edema or other signs of acute infarction.  There is no evidence of intracranial mass lesion or mass effect.  No abnormal extra-axial fluid collections are identified.  No evidence of hydrocephalus.  Mild cerebral atrophy and chronic small vessel disease again noted.  No evidence of skull fracture or other significant bone abnormality.    IMPRESSION:  1.  No acute intracranial abnormality. 2.  Stable mild cerebral atrophy and chronic small vessel disease.  Original Report Authenticated By: Danae Orleans, M.D.   Ct Cervical Spine Wo Contrast  07/03/2011  *RADIOLOGY REPORT*  Clinical Data: Unsteadiness.  Learning disability.  Fall.  CT CERVICAL SPINE WITHOUT CONTRAST  Technique:  Multidetector CT imaging of the cervical spine was performed. Multiplanar CT image reconstructions were also generated.  Comparison: Cervical spine radiographs dated 05/07/2010  Findings: Technical factors related to patient body habitus reduce diagnostic sensitivity and specificity.  Prominent adenoidal tissue noted, with mild swelling of the palatine tonsils and with swelling of the tonsillar pillars.  The piriform sinuses are moderately effaced.  Scattered cervical lymph nodes are likely reactive.  Streak artifact and  poor signal to noise ratio noted from the C4 level inferiorly, and this significantly reduces sensitivity for fracture in the lower cervical spine.  This is partially due to obesity and partially due to the shoulders being in a hunched position during imaging; I spoke with the technologist by telephone and he indicated that the patient was not amenable to other positions.  No definite prevertebral soft tissue swelling noted.  The epiglottis and aryepiglottic folds are of normal caliber.    IMPRESSION:  1.  Although I do not observe a fracture or acute subluxation, sensitivity is reduced due to indistinctness of the C5-T1 vertebral bodies related to streak artifact from the patient's obesity and shoulder positioning.  If there is a high degree of clinical concern of occult injury, supplemental imaging with conventional radiographs of the cervical spine would be recommended. 2.  Prominent tonsillar and adenoidal tissues.  Original Report Authenticated By: Dellia Cloud, M.D.   Ct Lumbar Spine Wo Contrast  07/03/2011  *RADIOLOGY REPORT*  Clinical Data: Unsteady gait  CT LUMBAR SPINE WITHOUT CONTRAST  Technique:  Multidetector CT imaging of the lumbar spine was performed without intravenous contrast administration. Multiplanar CT image reconstructions were also generated.  Comparison: None.  Findings: Alignment is normal.  There is no evidence of acquired degenerative disease.  No evidence of fracture or other focal lesion.  No suspicion of stenosis.    IMPRESSION: Negative CT scan of the lumbar spine without contrast.  Original Report Authenticated By: Thomasenia Sales, M.D.    ONG-2.9 [id number] RPR (-) Hba1c-5.7  Medications:     Scheduled:   . enoxaparin  40 mg Subcutaneous Q24H  . haloperidol  5 mg Oral BID  . QUEtiapine  600 mg Oral QHS    Assessment/Plan:    Patient Active Hospital Problem List: Ataxia (07/02/2011)   Assessment: 20 YO male with onset of bilateral weakness in  setting of significant psychiatric PMHx.  Neuro exam is nonfocal, CT head/ C-spine/lumbar spine all show no significant abnormality.  DTR present throughout. TSH, RPR, B12 all WNL. At this time do not see any neurological etiology for weakness and ataxia.     Recommend:  Continue PT/OT daily and continue to have patient see psychiatry while in hospital.  May benefit from out patient MRI (in open scanner) if weakness continues after discharge.   Neuro S/O  I have discussed patient with Dr. Lyman Speller and he agrees with the above mentioned.     Felicie Morn PA-C [facility] [phone]  07/04/2011, 8:24 AM

## 2011-07-04 NOTE — Progress Notes (Signed)
   CARE MANAGEMENT NOTE 07/04/2011  Patient:  Brandon [name], Brandon [name]   Account Number:  [id number]  Date Initiated:  07/03/2011  Documentation initiated by:  MAYO,HENRIETTA  Subjective/Objective Assessment:   20 yr-old male adm with ataxia; lives with mother and grandmother who assist with IADLs.     Action/Plan:   Anticipated DC Date:  07/07/2011   Anticipated DC Plan:  HOME/SELF CARE  In-house referral  Clinical Social Worker      DC Associate Professor  CM consult  Outpatient Services - Pt will follow up      Choice offered to / List presented to:     DME arranged  WALK[facility] - [facility]      DME agency  [facility]        Status of service:  Completed, signed off Medicare Important Message given?   (If response is "NO", the following Medicare IM given date fields will be blank) Date Medicare IM given:   Date Additional Medicare IM given:    Discharge Disposition:  HOME/SELF CARE  Per UR Regulation:    Comments:  PCP:  Dr. Drue Second @ [facility]  Contact:  Josefine Class, Mother  [phone]  07/04/11 1300 Henrietta Mayo RN MSN CCM PT recommends OP PT and rolling walker.  Provided pt with list of OP therapy centers and instructions re process.  TC to mother, with pt's permission, and VM message left. 1400 TC from mother who requests rollator vs walker, also requests assistance with enrolling pt in PCS through Medicaid.  Order for wide rollator faxed to [facility], left application for PCS, instructions re process in pt's room.  Explained OP therapy process, also left list of therapy centers and script for mother.  07/03/11 1150 Henrietta Mayo RN MSN CCM Per MD, family having difficulty caring for pt due to ataxia and size.  Await PT eval.

## 2011-07-04 NOTE — Discharge Summary (Signed)
Brandon [name] MRN: [id number] DOB/AGE: 24-Jun-1991 20 y.o.  Admit date: 07/02/2011 Discharge date: 07/04/2011  Primary Care Physician: [facility]   Discharge Diagnoses:   Patient Active Problem List  Diagnoses  . OBESITY  . ADHD  . Ataxia    DISCHARGE MEDICATION: Current Discharge Medication List    START taking these medications   Details  lisinopril (PRINIVIL,ZESTRIL) 10 MG tablet Take 1 tablet (10 mg total) by mouth daily. Qty: 30 tablet, Refills: 0      CONTINUE these medications which have NOT CHANGED   Details  haloperidol (HALDOL) 5 MG tablet Take 5 mg by mouth 2 (two) times daily.      QUEtiapine (SEROQUEL XR) 300 MG 24 hr tablet Take 600 mg by mouth at bedtime.             Consults: Treatment Team:  Houston Siren   SIGNIFICANT DIAGNOSTIC STUDIES:  Dg Chest 2 View  07/03/2011  *RADIOLOGY REPORT*  Clinical Data: Mass, pneumonia.  CHEST - 2 VIEW  Comparison: None.  Findings: Image quality is degraded by body habitus.  Trachea is midline.  Heart size is grossly within normal limits.  Lungs are low in volume but grossly clear.  No definite pleural fluid.  IMPRESSION:  1.  Image quality is degraded by body habitus. 2.  No definite acute findings.  Original Report Authenticated By: Reyes Ivan, M.D.   Ct Head Wo Contrast  07/02/2011  *RADIOLOGY REPORT*  Clinical Data: Dizziness.  Recurrent falls.  CT HEAD WITHOUT CONTRAST  Technique:  Contiguous axial images were obtained from the base of the skull through the vertex without contrast.  Comparison: 01/28/2011  Findings: There is no evidence of intracranial hemorrhage, brain edema or other signs of acute infarction.  There is no evidence of intracranial mass lesion or mass effect.  No abnormal extra-axial fluid collections are identified.  No evidence of hydrocephalus.  Mild cerebral atrophy and chronic small vessel disease again noted.  No evidence of skull fracture or other significant bone abnormality.   IMPRESSION:  1.  No acute intracranial abnormality. 2.  Stable mild cerebral atrophy and chronic small vessel disease.  Original Report Authenticated By: Danae Orleans, M.D.   Ct Cervical Spine Wo Contrast  07/03/2011  *RADIOLOGY REPORT*  Clinical Data: Unsteadiness.  Learning disability.  Fall.  CT CERVICAL SPINE WITHOUT CONTRAST  Technique:  Multidetector CT imaging of the cervical spine was performed. Multiplanar CT image reconstructions were also generated.  Comparison: Cervical spine radiographs dated 05/07/2010  Findings: Technical factors related to patient body habitus reduce diagnostic sensitivity and specificity.  Prominent adenoidal tissue noted, with mild swelling of the palatine tonsils and with swelling of the tonsillar pillars.  The piriform sinuses are moderately effaced.  Scattered cervical lymph nodes are likely reactive.  Streak artifact and poor signal to noise ratio noted from the C4 level inferiorly, and this significantly reduces sensitivity for fracture in the lower cervical spine.  This is partially due to obesity and partially due to the shoulders being in a hunched position during imaging; I spoke with the technologist by telephone and he indicated that the patient was not amenable to other positions.  No definite prevertebral soft tissue swelling noted.  The epiglottis and aryepiglottic folds are of normal caliber.  IMPRESSION:  1.  Although I do not observe a fracture or acute subluxation, sensitivity is reduced due to indistinctness of the C5-T1 vertebral bodies related to streak artifact from the patient's obesity and shoulder  positioning.  If there is a high degree of clinical concern of occult injury, supplemental imaging with conventional radiographs of the cervical spine would be recommended. 2.  Prominent tonsillar and adenoidal tissues.  Original Report Authenticated By: Dellia Cloud, M.D.   Ct Lumbar Spine Wo Contrast  07/03/2011  *RADIOLOGY REPORT*  Clinical  Data: Unsteady gait  CT LUMBAR SPINE WITHOUT CONTRAST  Technique:  Multidetector CT imaging of the lumbar spine was performed without intravenous contrast administration. Multiplanar CT image reconstructions were also generated.  Comparison: None.  Findings: Alignment is normal.  There is no evidence of acquired degenerative disease.  No evidence of fracture or other focal lesion.  No suspicion of stenosis.  IMPRESSION: Negative CT scan of the lumbar spine without contrast.  Original Report Authenticated By: Thomasenia Sales, M.D.            No results found for this or any previous visit (from the past 240 hour(s)).  BRIEF ADMITTING H & P: Brandon [name] is an 20 y.o. male with history of schizophrenia, bipolar disorder, hypertension, ADD, brought in by his grandparents complaining that he has been unsteady in his gait. He was recently admitted for the same, and reportedly had a workup including an MRI of his brain, which was only partially completed showing no abnormality. Family reports that he has inappropriate emotion, always laughing and thinking everything is comical. I noted that he has been on Seroquel at 300 mg per day along with Haldol 5 mg each bedtime. Workup in the emergency room included a CT of his head which was unremarkable. Because of his ataxia, and inability to try to ambulate, hospitalist was asked to admit the patient since family cannot care for him adequately.       Hospital Course:  Present on Admission:  .Ataxia: The patient was brought in with complaint of ataxia and weakness. However observation reveals the patient is at his baseline. The patient was seen by physical therapy who felt that the patient might benefit from some outpatient rehabilitation to improve his functional ability however there is no indication for any inpatient rehabilitation as had been hoped by the family. The patient had a CT of his head a performed on admission which showed no acute  intracranial abnormality and stable mild cerebral atrophy and chronic small vessel disease. The patient had no focal deficits warranting any further radiologic studies. The patient also had a CT of the lumbar spine which was negative. Patient has been given a prescription for outpatient physical therapy is to followup with arranging an outpatient physical therapy with Mr. Ida Rogue has been provided by case management. The patient also followup with his primary care physician in one week.  Hypertension: The patient's blood pressures are elevated. Per the patient's mother's report the patient had been on lisinopril and her reason that she cannot remember the patient was discontinued. The patient's blood pressures in the hospital heparin and the systolic of 160s and diastolic of 90s. The patient's been restarted on lisinopril 10 mg by mouth daily to continue as an outpatient. He is to followup with health administration further titration of his lisinopril.  Schizoaffective disorder: The patient was seen by Dr. Rogers Blocker in consult and recommendations were to continue his Seroquel and Haldol.  ADHD: Noted  Disposition and Follow-up:  The patient is to followup with his primary care physician at health services are week. The patient is also been given a prescription for outpatient physical therapy and a followup.  Discharge Orders    Future Orders Please Complete By Expires   Diet - low sodium heart healthy      Increase activity slowly         DISCHARGE EXAM:  General: Oriented to to self and place . Well-nourished well-developed and in no acute distress.  Blood pressure 126/82, pulse 102, temperature 97.9 F (36.6 C), temperature source Oral, resp. rate 22, height 6\' 3"  (1.905 m), weight 160.8 kg (354 lb 8 oz), SpO2 94.00%. HEENT: [place]/AT, PERRLA, EOMI.  Heart: Regular rate and rhythm, without murmurs, rubs, gallops.  Lungs: Clear to auscultation bilaterally. No wheezing or rhonchi  noted Abdomen: Soft, nontender, nondistended, positive bowel sounds, no masses no hepatosplenomegaly.  Extremities: No clubbing cyanosis or edema with positive pedal pulses.  Neuro: Cranial nerves II through XII grossly intact. No focal neurological deficits noted.    Basename 07/03/11 0600 07/03/11 0227 07/02/11 2003  NA 139 -- 139  K 3.6 -- 3.7  CL 101 -- 102  CO2 26 -- 27  GLUCOSE 147* -- 85  BUN 11 -- 11  CREATININE 0.86 0.97 --  CALCIUM 9.7 -- 9.8  MG -- -- --  PHOS -- -- --   No results found for this basename: AST:2,ALT:2,ALKPHOS:2,BILITOT:2,PROT:2,ALBUMIN:2 in the last 72 hours No results found for this basename: LIPASE:2,AMYLASE:2 in the last 72 hours  Basename 07/03/11 0600 07/03/11 0227  WBC 6.1 6.4  NEUTROABS -- --  HGB 13.2 14.2  HCT 41.1 42.3  MCV 80.1 79.7  PLT 206 215    Signed: Daisi Kentner A. 07/04/2011, 5:34 PM

## 2011-07-04 NOTE — Progress Notes (Addendum)
Occupational Therapy Evaluation Patient Details Name: Brandon [name] MRN: [id number] DOB: 08-26-1990 Today's Date: 07/04/2011  Problem List:  Patient Active Problem List  Diagnoses  . OBESITY  . ADHD  . Ataxia    Past Medical History:  Past Medical History  Diagnosis Date  . ADHD (attention deficit hyperactivity disorder)   . Hypertension   . Schizophrenia   . Bipolar 1 disorder    Past Surgical History: No past surgical history on file.  OT Assessment/Plan/Recommendation OT Assessment Clinical Impression Statement: Pt with ? Neuro changes or ? origin decreased ability to perform ADLs as did PTA requiring skilled intervention to cont to work on ADL OT Recommendation/Assessment: Patient will need skilled OT in the acute care venue OT Problem List: Decreased range of motion;Impaired balance (sitting and/or standing);Decreased cognition;Decreased safety awareness;Decreased activity tolerance;Decreased coordination;Decreased knowledge of use of DME or AE OT Therapy Diagnosis : Generalized weakness OT Plan OT Frequency: Min 2X/week OT Treatment/Interventions: Self-care/ADL training;DME and/or AE instruction;Patient/family education;Cognitive remediation/compensation;Balance training;Therapeutic exercise;Neuromuscular education;Therapeutic activities OT Recommendation Follow Up Recommendations: Home health OT;24 hour supervision/assistance Equipment Recommended: Tub/shower bench Individuals Consulted Consulted and Agree with Results and Recommendations: Patient OT Goals Acute Rehab OT Goals OT Goal Formulation: With patient Time For Goal Achievement: 5 days ADL Goals Pt Will Perform Upper Body Bathing: with supervision Pt Will Perform Lower Body Bathing: with min assist Pt Will Perform Upper Body Dressing: with supervision;Sit to stand from chair Pt Will Perform Lower Body Dressing: with min assist;Sit to stand from chair Pt Will Transfer to Toilet: with supervision;Stand  pivot transfer Pt Will Perform Toileting - Clothing Manipulation: with min assist Pt Will Perform Toileting - Hygiene: with min assist Pt Will Perform Tub/Shower Transfer: with min assist;Transfer tub bench  OT Evaluation Precautions/Restrictions  Precautions Precautions: Fall Required Braces or Orthoses: No Restrictions Weight Bearing Restrictions: No Prior Functioning Home Living Lives With: Family Receives Help From: Family Type of Home: House Home Layout: One level Bathroom Shower/Tub: Engineer, manufacturing systems: Standard Bathroom Accessibility: Yes How Accessible: Accessible via walker Home Adaptive Equipment: None   ADL ADL Grooming: Simulated;Wash/dry hands;Wash/dry face;Brushing hair;Minimal assistance Where Assessed - Grooming: Sitting, bed;Unsupported Upper Body Bathing: Simulated;Moderate assistance Where Assessed - Upper Body Bathing: Sitting, bed;Unsupported Lower Body Bathing: Simulated;+1 Total assistance Lower Body Bathing Details (indicate cue type and reason): difficulty with weight shift Where Assessed - Lower Body Bathing: Sitting, bed;Unsupported Upper Body Dressing: Moderate assistance;Simulated Lower Body Dressing: Simulated;+1 Total assistance (donning and doffing underwear and socks) Lower Body Dressing Details (indicate cue type and reason): difficulty with sitting balance and weight shifts  Where Assessed - Lower Body Dressing: Sitting, bed;Unsupported Toilet Transfer: Simulated;Moderate assistance Toilet Transfer Method: Stand pivot (with RW) Toileting - Clothing Manipulation: Moderate assistance Toileting - Hygiene: Simulated;Moderate assistance Vision/Perception  Vision - History Baseline Vision: Wears glasses all the time Patient Visual Report: No change from baseline Vision - Assessment Eye Alignment: Within Functional Limits Cognition Cognition Arousal/Alertness: Awake/alert Overall Cognitive Status: No family/caregiver present  to determine baseline cognitive functioning Attention: Impaired Current Attention Level: Selective Orientation Level: Oriented X4 Following Commands: Follows one step commands consistently;Follows multi-step commands inconsistently Safety/Judgement: Decreased awareness of safety precautions;Decreased safety judgement for tasks assessed Decreased Safety/Judgement: Impulsive Awareness of Deficits: Decreased awareness of deficits Cognition - Other Comments: pt slow to process information and difficulty problem solving Sensation/Coordination Sensation Light Touch: Appears Intact Stereognosis: Appears Intact Hot/Cold: Appears Intact Proprioception: Not tested Coordination Gross Motor Movements are Fluid and Coordinated: Yes Fine Motor Movements are  Fluid and Coordinated: No Finger Nose Finger Test: slow pace/speed, slightly uncoordinated  Extremity Assessment RUE Assessment RUE Assessment: Within Functional Limits LUE Assessment LUE Assessment: Within Functional Limits Mobility  Bed Mobility Supine to Sit: 4: Min assist Sitting - Scoot to Edge of Bed: 4: Min assist Transfers Sit to Stand: 3: Mod assist Stand to Sit: 3: Mod assist Stand to Sit Details: uncontrolled decent, impulsive throughout mobility and functional tasks End of Session OT - End of Session Activity Tolerance: Patient limited by fatigue Patient left: in bed;with bed alarm set;with call bell in reach General Behavior During Session: Flat affect Cognition: Impaired   Brandon [name] 07/04/2011, 2:50 PM

## 2011-07-06 NOTE — ED Provider Notes (Signed)
Medical screening examination/treatment/procedure(s) were performed by non-physician practitioner and as supervising physician I was immediately available for consultation/collaboration.  Hurman Horn, MD 07/06/11 2133

## 2011-07-11 ENCOUNTER — Ambulatory Visit: Payer: Medicaid Other | Attending: Internal Medicine | Admitting: Rehabilitative and Restorative Service Providers"

## 2011-07-11 DIAGNOSIS — R269 Unspecified abnormalities of gait and mobility: Secondary | ICD-10-CM | POA: Insufficient documentation

## 2011-07-11 DIAGNOSIS — IMO0001 Reserved for inherently not codable concepts without codable children: Secondary | ICD-10-CM | POA: Insufficient documentation

## 2011-07-14 ENCOUNTER — Ambulatory Visit: Payer: Medicaid Other | Admitting: Rehabilitative and Restorative Service Providers"

## 2011-07-18 ENCOUNTER — Ambulatory Visit: Payer: Medicaid Other | Admitting: Rehabilitative and Restorative Service Providers"

## 2011-07-23 ENCOUNTER — Ambulatory Visit: Payer: Medicaid Other | Admitting: Rehabilitative and Restorative Service Providers"

## 2011-07-25 ENCOUNTER — Ambulatory Visit: Payer: Medicaid Other | Admitting: Rehabilitative and Restorative Service Providers"

## 2011-09-11 ENCOUNTER — Emergency Department (HOSPITAL_COMMUNITY)
Admission: EM | Admit: 2011-09-11 | Discharge: 2011-09-14 | Disposition: A | Discharge status: Home / Self Care | Payer: Medicaid Other | Attending: Emergency Medicine | Admitting: Emergency Medicine

## 2011-09-11 ENCOUNTER — Encounter (HOSPITAL_COMMUNITY): Payer: Self-pay | Admitting: Emergency Medicine

## 2011-09-11 DIAGNOSIS — I10 Essential (primary) hypertension: Secondary | ICD-10-CM | POA: Insufficient documentation

## 2011-09-11 DIAGNOSIS — F209 Schizophrenia, unspecified: Secondary | ICD-10-CM | POA: Insufficient documentation

## 2011-09-11 DIAGNOSIS — F603 Borderline personality disorder: Secondary | ICD-10-CM | POA: Insufficient documentation

## 2011-09-11 DIAGNOSIS — R4689 Other symptoms and signs involving appearance and behavior: Secondary | ICD-10-CM

## 2011-09-11 DIAGNOSIS — Z79899 Other long term (current) drug therapy: Secondary | ICD-10-CM | POA: Insufficient documentation

## 2011-09-11 LAB — CBC
HCT: 42 % (ref 39.0–52.0)
Hemoglobin: 13.9 g/dL (ref 13.0–17.0)
MCH: 26.1 pg (ref 26.0–34.0)
MCHC: 33.1 g/dL (ref 30.0–36.0)
MCV: 78.9 fL (ref 78.0–100.0)
Platelets: 246 10*3/uL (ref 150–400)
RBC: 5.32 MIL/uL (ref 4.22–5.81)
RDW: 13.5 % (ref 11.5–15.5)
WBC: 7.8 10*3/uL (ref 4.0–10.5)

## 2011-09-11 LAB — URINALYSIS, ROUTINE W REFLEX MICROSCOPIC
Bilirubin Urine: NEGATIVE
Glucose, UA: NEGATIVE mg/dL
Hgb urine dipstick: NEGATIVE
Leukocytes, UA: NEGATIVE
Nitrite: NEGATIVE
Protein, ur: 30 mg/dL — AB
Specific Gravity, Urine: 1.031 — ABNORMAL HIGH (ref 1.005–1.030)
Urobilinogen, UA: 0.2 mg/dL (ref 0.0–1.0)
pH: 6 (ref 5.0–8.0)

## 2011-09-11 LAB — COMPREHENSIVE METABOLIC PANEL
ALT: 52 U/L (ref 0–53)
AST: 23 U/L (ref 0–37)
Albumin: 4.2 g/dL (ref 3.5–5.2)
Alkaline Phosphatase: 137 U/L — ABNORMAL HIGH (ref 39–117)
BUN: 14 mg/dL (ref 6–23)
CO2: 27 mEq/L (ref 19–32)
Calcium: 10.1 mg/dL (ref 8.4–10.5)
Chloride: 101 mEq/L (ref 96–112)
Creatinine, Ser: 0.9 mg/dL (ref 0.50–1.35)
GFR calc Af Amer: >90 mL/min (ref >90)
GFR calc non Af Amer: >90 mL/min (ref >90)
Glucose, Bld: 100 mg/dL — ABNORMAL HIGH (ref 70–99)
Potassium: 4 mEq/L (ref 3.5–5.1)
Sodium: 138 mEq/L (ref 135–145)
Total Bilirubin: 0.3 mg/dL (ref 0.3–1.2)
Total Protein: 8.3 g/dL (ref 6.0–8.3)

## 2011-09-11 LAB — URINE MICROSCOPIC-ADD ON

## 2011-09-11 LAB — RAPID URINE DRUG SCREEN, HOSP PERFORMED
Amphetamines: NOT DETECTED
Barbiturates: NOT DETECTED
Benzodiazepines: NOT DETECTED
Cocaine: NOT DETECTED
Opiates: NOT DETECTED
Tetrahydrocannabinol: NOT DETECTED

## 2011-09-11 LAB — ETHANOL: Alcohol, Ethyl (B): <11 mg/dL (ref 0–11)

## 2011-09-11 MED ORDER — HALOPERIDOL 5 MG PO TABS
5.0000 mg | ORAL_TABLET | Freq: Every day | ORAL | Status: DC
  Administered 2011-09-11 – 2011-09-14 (×4): 5 mg via ORAL
  Filled 2011-09-11 (×4): qty 1

## 2011-09-11 MED ORDER — ALUM & MAG HYDROXIDE-SIMETH 200-200-20 MG/5ML PO SUSP: 30.0000 mL | ORAL | Status: DC | PRN

## 2011-09-11 MED ORDER — ACETAMINOPHEN 325 MG PO TABS: 650.0000 mg | ORAL_TABLET | ORAL | Status: DC | PRN

## 2011-09-11 MED ORDER — NICOTINE 21 MG/24HR TD PT24
21.0000 mg | MEDICATED_PATCH | Freq: Every day | TRANSDERMAL | Status: DC
  Administered 2011-09-12: 21 mg via TRANSDERMAL
  Filled 2011-09-11 (×2): qty 1

## 2011-09-11 MED ORDER — IBUPROFEN 600 MG PO TABS: 600.0000 mg | ORAL_TABLET | Freq: Three times a day (TID) | ORAL | Status: DC | PRN

## 2011-09-11 MED ORDER — LISINOPRIL 10 MG PO TABS
10.0000 mg | ORAL_TABLET | Freq: Every day | ORAL | Status: DC
  Administered 2011-09-11 – 2011-09-14 (×4): 10 mg via ORAL
  Filled 2011-09-11 (×7): qty 1

## 2011-09-11 MED ORDER — ONDANSETRON HCL 4 MG PO TABS: 4.0000 mg | ORAL_TABLET | Freq: Three times a day (TID) | ORAL | Status: DC | PRN

## 2011-09-11 MED ORDER — ZOLPIDEM TARTRATE 5 MG PO TABS: 5.0000 mg | ORAL_TABLET | Freq: Every evening | ORAL | Status: DC | PRN

## 2011-09-11 MED ORDER — QUETIAPINE FUMARATE ER 300 MG PO TB24
600.0000 mg | ORAL_TABLET | Freq: Every day | ORAL | Status: DC
  Administered 2011-09-11 – 2011-09-13 (×3): 600 mg via ORAL
  Filled 2011-09-11 (×7): qty 2

## 2011-09-11 NOTE — ED Notes (Signed)
Patient placed in paper scrubs. Security checked patient and belongings. Belongings placed in cabinet 1 in triage.

## 2011-09-11 NOTE — ED Notes (Signed)
Per IVC papers, reports pt has had an argument with his mother and became aggressive towards her.  Pt's mother took IVC papers out on pt.  She felt threatened.  Reports states that pt is having auditory hallucinations, which pt denies.  Pt reports he asked his mom for money and when his mother said no, "i got angry."  Pt is calm and cooperative at this time.

## 2011-09-11 NOTE — ED Provider Notes (Signed)
History     CSN: [id number]  Arrival date & time 09/11/11  1737   First MD Initiated Contact with Patient 09/11/11 1837      Chief Complaint  Patient presents with  . Medical Clearance    (Consider location/radiation/quality/duration/timing/severity/associated sxs/prior treatment) HPI  Pt presents to the ED by [facility] with IVC papers for being aggressive towards his mother. The patient states that he asked her for money to cut his hair and she said no. When she said no he got very angry. Pt denies having hallucinations, problems with anger and depression. He denies HI, SI or wanting to hurt his mother. He states that he would never touch his mother but he just angry angry and "she just took it like that, where she got scared".  Upon my examination the patient has calmed down and his pulse is 98.  Past Medical History  Diagnosis Date  . ADHD (attention deficit hyperactivity disorder)   . Hypertension   . Schizophrenia   . Bipolar 1 disorder     History reviewed. No pertinent past surgical history.  No family history on file.  History  Substance Use Topics  . Smoking status: Never Smoker   . Smokeless tobacco: Not on file  . Alcohol Use: No      Review of Systems  All other systems reviewed and are negative.    Allergies  Review of patient's allergies indicates no known allergies.  Home Medications   Current Outpatient Rx  Name Route Sig Dispense Refill  . HALOPERIDOL 5 MG PO TABS Oral Take 5 mg by mouth daily.     Marland Kitchen LISINOPRIL 10 MG PO TABS Oral Take 1 tablet (10 mg total) by mouth daily. 30 tablet 0  . QUETIAPINE FUMARATE ER 300 MG PO TB24 Oral Take 600 mg by mouth at bedtime.       BP 158/84  Pulse 143  Temp(Src) 98.8 F (37.1 C) (Oral)  Resp 22  SpO2 96%  Physical Exam  Nursing note and vitals reviewed. Constitutional: He appears well-developed and well-nourished. No distress.  HENT:  Head: Normocephalic and atraumatic.  Eyes: Pupils are equal, round,  and reactive to light.  Neck: Normal range of motion. Neck supple.  Cardiovascular: Normal rate and regular rhythm.   Pulmonary/Chest: Effort normal.  Abdominal: Soft.  Neurological: He is alert.  Skin: Skin is warm and dry. He is not diaphoretic.  Psychiatric: He has a normal mood and affect. His speech is normal and behavior is normal. Thought content normal.    ED Course  Procedures (including critical care time)  Labs Reviewed  COMPREHENSIVE METABOLIC PANEL - Abnormal; Notable for the following:    Glucose, Bld 100 (*)    Alkaline Phosphatase 137 (*)    All other components within normal limits  URINE RAPID DRUG SCREEN (HOSP PERFORMED)  CBC  ETHANOL  URINALYSIS, ROUTINE W REFLEX MICROSCOPIC   No results found.   1. Aggressive behavior       MDM  ACT consulted and has agreed to see patient.        Dorthula Matas, PA 09/11/11 1913  Dorthula Matas, PA 09/11/11 1943

## 2011-09-11 NOTE — ED Notes (Signed)
Patient reports the reason for his visit tonight is that he asked his mother for money for a hair cut today and she told him no and "I got angry" when asked if he threatened his mother pt reports "no I would never threaten my mom" pt denies SI/HI/VH pt does admit to auditory hallucinations reports "that's why I take medications" "it works for the voices" Pt under IVC petition by mother reports that pt is hostile aggressive and assaultive towards mother pt threatened to get a gun and kill everyone in the home mother is paralyzed and fearful of the pt. Pt reports hearing voices. Pt is self mutilating by digging into his arms pt is a danger to himself and others. Pt calm and cooperative. Flat affect. Pt awaiting to see ACT Counselor. Will continue to monitor for safety.

## 2011-09-11 NOTE — ED Notes (Signed)
Pt belongings locked in room

## 2011-09-12 NOTE — ED Provider Notes (Signed)
Medical screening examination/treatment/procedure(s) were performed by non-physician practitioner and as supervising physician I was immediately available for consultation/collaboration.   Ashland Wiseman, MD 09/12/11 0955 

## 2011-09-12 NOTE — BH Assessment (Signed)
Assessment Note   Brandon [name] is a 21 y.o. male who presents with aggressive and assaultive behavior towards mother and family members.  Pt is involuntarily committed.  Pt denies any aggressive behavior towards family.  Pt states he asked mother for money for hair cut and she wouldn't give him money because she had already provided money for him on 09/10/11.  Pt says he became angry and told his mother he was going to leave "f--king" home, states mom may have been scared.  Pt reports that he does have AH and takes medications--"it helps with the voices.  The petition filed by grandmother states that pt threatened to get a gun and kill everyone in the home.  Pt.'s mom is paralyzed and afraid of the pt.  Pt does hear voices and the voices says--"I'm going to get you".  Pt also self mutilates by "digging into his arm".  Pt has past hx with [facility] and was d/c'd 03/2011.  Pt is cooperative, calm and has ben disruptive since being in the ed.  Pt will telepsych to determine final disposition.  Pt has flat, apathetic affect when being addressed and states--"I don't think I need to go the hospital".   Axis I: Schizoaffective Disorder Axis II: Deferred Axis III:  Past Medical History  Diagnosis Date  . ADHD (attention deficit hyperactivity disorder)   . Hypertension   . Schizophrenia   . Bipolar 1 disorder    Axis IV: other psychosocial or environmental problems, problems related to social environment and problems with primary support group Axis V: 31-40 impairment in reality testing  Past Medical History:  Past Medical History  Diagnosis Date  . ADHD (attention deficit hyperactivity disorder)   . Hypertension   . Schizophrenia   . Bipolar 1 disorder     History reviewed. No pertinent past surgical history.  Family History: No family history on file.  Social History:  reports that he has never smoked. He does not have any smokeless tobacco history on file. He reports that he does not  drink alcohol or use illicit drugs.  Additional Social History:  Alcohol / Drug Use Pain Medications: None  Prescriptions: None  Over the Counter: None  History of alcohol / drug use?: No history of alcohol / drug abuse Longest period of sobriety (when/how long): None  Allergies: No Known Allergies  Home Medications:  Medications Prior to Admission  Medication Dose Route Frequency Provider Last Rate Last Dose  . acetaminophen (TYLENOL) tablet 650 mg  650 mg Oral Q4H PRN Dorthula Matas, PA      . alum & mag hydroxide-simeth (MAALOX/MYLANTA) 200-200-20 MG/5ML suspension 30 mL  30 mL Oral PRN Dorthula Matas, PA      . haloperidol (HALDOL) tablet 5 mg  5 mg Oral Daily Dorthula Matas, PA   5 mg at 09/11/11 2043  . ibuprofen (ADVIL,MOTRIN) tablet 600 mg  600 mg Oral Q8H PRN Dorthula Matas, PA      . lisinopril (PRINIVIL,ZESTRIL) tablet 10 mg  10 mg Oral Daily Dorthula Matas, PA   10 mg at 09/11/11 2053  . nicotine (NICODERM CQ - dosed in mg/24 hours) patch 21 mg  21 mg Transdermal Daily Dorthula Matas, PA      . ondansetron (ZOFRAN) tablet 4 mg  4 mg Oral Q8H PRN Dorthula Matas, PA      . QUEtiapine (SEROQUEL XR) 24 hr tablet 600 mg  600 mg Oral QHS Tiffany G  Neva Seat, PA   600 mg at 09/11/11 2200  . zolpidem (AMBIEN) tablet 5 mg  5 mg Oral QHS PRN Dorthula Matas, PA       Medications Prior to Admission  Medication Sig Dispense Refill  . haloperidol (HALDOL) 5 MG tablet Take 5 mg by mouth daily.       Marland Kitchen lisinopril (PRINIVIL,ZESTRIL) 10 MG tablet Take 1 tablet (10 mg total) by mouth daily.  30 tablet  0  . QUEtiapine (SEROQUEL XR) 300 MG 24 hr tablet Take 600 mg by mouth at bedtime.         OB/GYN Status:  No LMP for male patient.  General Assessment Data Location of Assessment: [facility] ED Living Arrangements: Family members Can pt return to current living arrangement?: Yes Admission Status: Involuntary Is patient capable of signing voluntary admission?: No Transfer from:  Acute Hospital Referral Source: MD  Education Status Is patient currently in school?: No Current Grade: None  Highest grade of school patient has completed: Unk  Name of school: [employer]  Contact person: None   Risk to self Suicidal Ideation: No Suicidal Intent: No Is patient at risk for suicide?: No Suicidal Plan?: No Access to Means: No What has been your use of drugs/alcohol within the last 12 months?: Pt denies  Previous Attempts/Gestures: Yes How many times?: 1  (Gesture ) Other Self Harm Risks: None  Triggers for Past Attempts: Family contact Intentional Self Injurious Behavior: Cutting (Petition states pt has "digging" into his arms ) Comment - Self Injurious Behavior: Petition states pt has been "digging into his arms  Family Suicide History: Yes (Father--Unk issue ) Recent stressful life event(s): Conflict (Comment) (Argument with mother 09/11/11) Persecutory voices/beliefs?: No Depression: No Substance abuse history and/or treatment for substance abuse?: No Suicide prevention information given to non-admitted patients: Not applicable  Risk to Others Homicidal Ideation: No-Not Currently/Within Last 6 Months Thoughts of Harm to Others: No-Not Currently Present/Within Last 6 Months Comment - Thoughts of Harm to Others: Pt denies  Current Homicidal Intent: No-Not Currently/Within Last 6 Months Current Homicidal Plan: No-Not Currently/Within Last 6 Months Access to Homicidal Means: No Identified Victim: None History of harm to others?: Yes Assessment of Violence: In past 6-12 months Violent Behavior Description: Aggressive behavior towards mother  Does patient have access to weapons?: No Criminal Charges Pending?: No Does patient have a court date: No  Psychosis Hallucinations: Auditory Delusions: None noted  Mental Status Report Appear/Hygiene: Disheveled Eye Contact: Good Motor Activity: Unremarkable Speech: Logical/coherent Level of Consciousness: Alert Mood:  Apathetic Affect: Apathetic Anxiety Level: None Thought Processes: Relevant Judgement: Impaired Orientation: Person;Place;Time;Situation Obsessive Compulsive Thoughts/Behaviors: None  Cognitive Functioning Concentration: Normal Memory: Recent Intact;Remote Intact IQ: Average Insight: Poor Impulse Control: Poor Appetite: Good Weight Loss: 0  Weight Gain: 0  Sleep: No Change Total Hours of Sleep: 8  Vegetative Symptoms: None  Prior Inpatient Therapy Prior Inpatient Therapy: Yes Prior Therapy Dates: 2012 Prior Therapy Facilty/Provider(s): [facility]  Reason for Treatment: Mood D/O   Prior Outpatient Therapy Prior Outpatient Therapy: Yes Prior Therapy Dates: Current  Prior Therapy Facilty/Provider(s): [facility]  Reason for Treatment: Med Mgt; Therapy   ADL Screening (condition at time of admission) Patient's cognitive ability adequate to safely complete daily activities?: Yes Patient able to express need for assistance with ADLs?: Yes Independently performs ADLs?: Yes Weakness of Legs: None Weakness of Arms/Hands: None       Abuse/Neglect Assessment (Assessment to be complete while patient is alone) Physical Abuse: Denies Verbal  Abuse: Denies Sexual Abuse: Denies Exploitation of patient/patient's resources: Denies Self-Neglect: Denies Values / Beliefs Cultural Requests During Hospitalization: None Spiritual Requests During Hospitalization: None Consults Spiritual Care Consult Needed: No Social Work Consult Needed: No Merchant navy officer (For Healthcare) Advance Directive: Patient does not have advance directive;Patient would not like information Pre-existing out of facility DNR order (yellow form or pink MOST form): No Nutrition Screen Diet: Regular  Additional Information 1:1 In Past 12 Months?: No CIRT Risk: No Elopement Risk: No Does patient have medical clearance?: Yes     Disposition:  Disposition Disposition of Patient: Inpatient treatment  program;Referred to Patient referred to: Other (Comment) [facility] for review )  On Site Evaluation by:   Reviewed with Physician:     Murrell Redden 09/12/2011 5:51 AM

## 2011-09-12 NOTE — ED Notes (Signed)
Pt  Mother called wants it be known that she is his POA, no papers are on file.

## 2011-09-13 ENCOUNTER — Encounter (HOSPITAL_COMMUNITY): Payer: Self-pay | Admitting: *Deleted

## 2011-09-13 NOTE — BH Assessment (Signed)
Assessment Note   Brandon [name] is an 21 y.o. male who initially presented to [facility] Emergency Department with the chief complaint of AH and aggressive behavior towards mother and family members. Per previous assessment by ACT counselor, pt became aggressive and upset once his mother refused to provide him with money for a hair cut. Patient became verbally abusive towards his mother, stating that he was going to leave the "fucking" house while raising his voice in a threatening manner. During reassessment today patient verbalized to writer that he has never raised his voice at his mother before "I was really upset. I didn't want to hurt her. I just want to go home." When asked about his previous statements of having auditory hallucinations, pt replied "I'm not hearing any voices. Not right now." Pt has a noted history of inpatient treatment at [facility] for similar mood disturbances. Patient reported that he has been taking his psychiatric medications as prescribed although his presentation in regards to his recent behavior demonstrates otherwise. Patient denies having homicidal thoughts towards others at this time. Telepsych consult has been completed which recommends inpatient treatment under IVC; however pt may benefit from another evaluation due to recent change in psychiatric symptoms.   Axis I: Schizoaffective Disorder Axis II: Deferred Axis III:  Past Medical History  Diagnosis Date  . ADHD (attention deficit hyperactivity disorder)   . Hypertension   . Schizophrenia   . Bipolar 1 disorder    Axis IV: other psychosocial or environmental problems Axis V: 31-40 impairment in reality testing  Past Medical History:  Past Medical History  Diagnosis Date  . ADHD (attention deficit hyperactivity disorder)   . Hypertension   . Schizophrenia   . Bipolar 1 disorder     History reviewed. No pertinent past surgical history.  Family History: No family history on file.  Social  History:  reports that he has never smoked. He does not have any smokeless tobacco history on file. He reports that he does not drink alcohol or use illicit drugs.  Additional Social History:  Alcohol / Drug Use Pain Medications: None  Prescriptions: None  Over the Counter: None  History of alcohol / drug use?: No history of alcohol / drug abuse Longest period of sobriety (when/how long): None  Allergies: No Known Allergies  Home Medications:  Medications Prior to Admission  Medication Dose Route Frequency Provider Last Rate Last Dose  . acetaminophen (TYLENOL) tablet 650 mg  650 mg Oral Q4H PRN Dorthula Matas, PA      . alum & mag hydroxide-simeth (MAALOX/MYLANTA) 200-200-20 MG/5ML suspension 30 mL  30 mL Oral PRN Dorthula Matas, PA      . haloperidol (HALDOL) tablet 5 mg  5 mg Oral Daily Dorthula Matas, PA   5 mg at 09/13/11 1009  . ibuprofen (ADVIL,MOTRIN) tablet 600 mg  600 mg Oral Q8H PRN Dorthula Matas, PA      . lisinopril (PRINIVIL,ZESTRIL) tablet 10 mg  10 mg Oral Daily Dorthula Matas, PA   10 mg at 09/13/11 1010  . nicotine (NICODERM CQ - dosed in mg/24 hours) patch 21 mg  21 mg Transdermal Daily Dorthula Matas, PA   21 mg at 09/12/11 1024  . ondansetron (ZOFRAN) tablet 4 mg  4 mg Oral Q8H PRN Dorthula Matas, PA      . QUEtiapine (SEROQUEL XR) 24 hr tablet 600 mg  600 mg Oral QHS Dorthula Matas, PA   600 mg  at 09/12/11 2138  . zolpidem (AMBIEN) tablet 5 mg  5 mg Oral QHS PRN Dorthula Matas, PA       Medications Prior to Admission  Medication Sig Dispense Refill  . haloperidol (HALDOL) 5 MG tablet Take 5 mg by mouth daily.       Marland Kitchen lisinopril (PRINIVIL,ZESTRIL) 10 MG tablet Take 1 tablet (10 mg total) by mouth daily.  30 tablet  0  . QUEtiapine (SEROQUEL XR) 300 MG 24 hr tablet Take 600 mg by mouth at bedtime.         OB/GYN Status:  No LMP for male patient.  General Assessment Data Location of Assessment: [facility] ED Living Arrangements: Parent;Family  members Can pt return to current living arrangement?: Yes Admission Status: Involuntary Is patient capable of signing voluntary admission?: No Transfer from: Acute Hospital Referral Source: Self/Family/Friend  Education Status Is patient currently in school?: No Current Grade: None  Highest grade of school patient has completed: Unk  Name of school: [employer]  Contact person: None   Risk to self Suicidal Ideation: No Suicidal Intent: No Is patient at risk for suicide?: No Suicidal Plan?: No Access to Means: No What has been your use of drugs/alcohol within the last 12 months?: Pt denies SA use Previous Attempts/Gestures: Yes How many times?: 1  (Gesture) Other Self Harm Risks: None reported Triggers for Past Attempts: Family contact Intentional Self Injurious Behavior: Cutting (Per IVC) Comment - Self Injurious Behavior: Stated in IVC Family Suicide History: Yes (Father) Recent stressful life event(s): Conflict (Comment) (Dispute with mother ) Persecutory voices/beliefs?: No Depression: No Substance abuse history and/or treatment for substance abuse?: No Suicide prevention information given to non-admitted patients: Not applicable  Risk to Others Homicidal Ideation: No-Not Currently/Within Last 6 Months Thoughts of Harm to Others: No-Not Currently Present/Within Last 6 Months Comment - Thoughts of Harm to Others: Patient denies thoughts to harm others Current Homicidal Intent: No-Not Currently/Within Last 6 Months Current Homicidal Plan: No-Not Currently/Within Last 6 Months Access to Homicidal Means: No Identified Victim: None reported by pt History of harm to others?: Yes Assessment of Violence: In past 6-12 months Violent Behavior Description: Pt has been aggressive with mother in past Does patient have access to weapons?: No Criminal Charges Pending?: No Does patient have a court date: No  Psychosis Hallucinations: Auditory Delusions: None noted  Mental Status  Report Appear/Hygiene: Disheveled Eye Contact: Good Motor Activity: Unremarkable Speech: Logical/coherent Level of Consciousness: Alert Mood: Apathetic Affect: Apathetic;Blunted Anxiety Level: None Thought Processes: Relevant Judgement: Impaired Orientation: Person;Place;Time;Situation Obsessive Compulsive Thoughts/Behaviors: None  Cognitive Functioning Concentration: Normal Memory: Recent Intact;Remote Intact IQ: Average Insight: Poor Impulse Control: Poor Appetite: Good Weight Loss: 0  Weight Gain: 0  Sleep: No Change Total Hours of Sleep: 9  Vegetative Symptoms: None  Prior Inpatient Therapy Prior Inpatient Therapy: Yes Prior Therapy Dates: 2012 Prior Therapy Facilty/Provider(s): [facility] Reason for Treatment: Mood Disorder  Prior Outpatient Therapy Prior Outpatient Therapy: Yes Prior Therapy Dates: Current Prior Therapy Facilty/Provider(s): [facility]  Reason for Treatment: Psych, Med Mgmt  ADL Screening (condition at time of admission) Patient's cognitive ability adequate to safely complete daily activities?: Yes Patient able to express need for assistance with ADLs?: Yes Independently performs ADLs?: Yes Weakness of Legs: None Weakness of Arms/Hands: None  Home Assistive Devices/Equipment Home Assistive Devices/Equipment: None  Therapy Consults (therapy consults require a physician order) PT Evaluation Needed: No OT Evalulation Needed: No Abuse/Neglect Assessment (Assessment to be complete while patient is alone) Physical Abuse:  Denies Verbal Abuse: Denies Sexual Abuse: Denies Exploitation of patient/patient's resources: Denies Self-Neglect: Denies Values / Beliefs Cultural Requests During Hospitalization: None Spiritual Requests During Hospitalization: None Consults Spiritual Care Consult Needed: No Social Work Consult Needed: No Merchant navy officer (For Healthcare) Advance Directive: Patient does not have advance directive;Patient would not  like information Pre-existing out of facility DNR order (yellow form or pink MOST form): No Nutrition Screen Diet: Regular  Additional Information 1:1 In Past 12 Months?: No CIRT Risk: No Elopement Risk: No Does patient have medical clearance?: Yes     Disposition: Inpatient treatment recommended by telepsych.  Disposition Disposition of Patient: Inpatient treatment program Type of inpatient treatment program: Adult Patient referred to: Other (Comment) [facility] for review )  On Site Evaluation by:   Reviewed with Physician:     Janann Colonel C 09/13/2011 4:51 PM

## 2011-09-13 NOTE — ED Notes (Signed)
Tele pysch intitaed

## 2011-09-13 NOTE — ED Provider Notes (Signed)
Pt alert, content. Nad. telepsych rec inpt rx. Act team working on placement.   Suzi Roots, MD 09/13/11 1027

## 2011-09-14 NOTE — BH Assessment (Signed)
1st opinion completed by EDP and filed in pt's chart.

## 2011-09-14 NOTE — BH Assessment (Signed)
Assessment Note   Brandon [name] is an 21 y.o. male who presented to the Emergency Department due to experiencing auditory hallucinations and aggressive behavior towards his mother. During reassessment, pt was calm and oriented x4. Pt reported to writer "I'm not having thoughts to hurt anyone. I'm much better now. I was just upset then. " On initial visit pt reportedly became verbally abusive towards his mother and raised his voice at her, projecting a threatening environment due to his size. Patient expressed to writer that he is not having suicidal ideations nor auditory/visual hallucinations as well. Patient was observed to be engaging during reassessment and in a jovial mood. Pt continuously stated "I just miss my family a lot. I'm ready to go home and be with my mom." Writer recommends patient to be evaluated by Specialist On Call due to change in presentation and stabilization.   Axis I: Schizoaffective Disorder Axis II: Deferred Axis III:  Past Medical History  Diagnosis Date  . ADHD (attention deficit hyperactivity disorder)   . Hypertension   . Schizophrenia   . Bipolar 1 disorder    Axis IV: other psychosocial or environmental problems Axis V: 41-50 serious symptoms  Past Medical History:  Past Medical History  Diagnosis Date  . ADHD (attention deficit hyperactivity disorder)   . Hypertension   . Schizophrenia   . Bipolar 1 disorder     History reviewed. No pertinent past surgical history.  Family History: No family history on file.  Social History:  reports that he has never smoked. He does not have any smokeless tobacco history on file. He reports that he does not drink alcohol or use illicit drugs.  Additional Social History:  Alcohol / Drug Use Pain Medications: None Prescriptions: None Over the Counter: None History of alcohol / drug use?: No history of alcohol / drug abuse Longest period of sobriety (when/how long): N/A Allergies: No Known Allergies  Home  Medications:  Medications Prior to Admission  Medication Dose Route Frequency Provider Last Rate Last Dose  . acetaminophen (TYLENOL) tablet 650 mg  650 mg Oral Q4H PRN Dorthula Matas, PA      . alum & mag hydroxide-simeth (MAALOX/MYLANTA) 200-200-20 MG/5ML suspension 30 mL  30 mL Oral PRN Dorthula Matas, PA      . haloperidol (HALDOL) tablet 5 mg  5 mg Oral Daily Dorthula Matas, PA   5 mg at 09/14/11 0945  . ibuprofen (ADVIL,MOTRIN) tablet 600 mg  600 mg Oral Q8H PRN Dorthula Matas, PA      . lisinopril (PRINIVIL,ZESTRIL) tablet 10 mg  10 mg Oral Daily Dorthula Matas, PA   10 mg at 09/14/11 1122  . nicotine (NICODERM CQ - dosed in mg/24 hours) patch 21 mg  21 mg Transdermal Daily Dorthula Matas, PA   21 mg at 09/12/11 1024  . ondansetron (ZOFRAN) tablet 4 mg  4 mg Oral Q8H PRN Dorthula Matas, PA      . QUEtiapine (SEROQUEL XR) 24 hr tablet 600 mg  600 mg Oral QHS Dorthula Matas, PA   600 mg at 09/13/11 2209  . zolpidem (AMBIEN) tablet 5 mg  5 mg Oral QHS PRN Dorthula Matas, PA       Medications Prior to Admission  Medication Sig Dispense Refill  . haloperidol (HALDOL) 5 MG tablet Take 5 mg by mouth daily.       Marland Kitchen lisinopril (PRINIVIL,ZESTRIL) 10 MG tablet Take 1 tablet (10 mg total) by  mouth daily.  30 tablet  0  . QUEtiapine (SEROQUEL XR) 300 MG 24 hr tablet Take 600 mg by mouth at bedtime.         OB/GYN Status:  No LMP for male patient.  General Assessment Data Location of Assessment: [facility] Living Arrangements: Parent;Family members Can pt return to current living arrangement?: Yes Admission Status: Involuntary Is patient capable of signing voluntary admission?: No Transfer from: Acute Hospital Referral Source: Self/Family/Friend  Education Status Is patient currently in school?: No Current Grade: None  Highest grade of school patient has completed: Unk  Name of school: [employer]  Contact person: None   Risk to self Suicidal Ideation: No Suicidal Intent: No Is  patient at risk for suicide?: No Suicidal Plan?: No Access to Means: No What has been your use of drugs/alcohol within the last 12 months?: Pt denies Previous Attempts/Gestures: Yes How many times?: 1  Other Self Harm Risks: None reported Triggers for Past Attempts: Family contact Intentional Self Injurious Behavior: Cutting (Per IVC) Comment - Self Injurious Behavior: Behavior reported on IVC Family Suicide History: Yes Recent stressful life event(s): Conflict (Comment) (Family stressors with mother) Persecutory voices/beliefs?: No Depression: No Substance abuse history and/or treatment for substance abuse?: No Suicide prevention information given to non-admitted patients: Not applicable  Risk to Others Homicidal Ideation: No-Not Currently/Within Last 6 Months Thoughts of Harm to Others: No Comment - Thoughts of Harm to Others: Pt denies thoughts of harm  Current Homicidal Intent: No Current Homicidal Plan: No Access to Homicidal Means: No Identified Victim: None reported History of harm to others?: Yes Assessment of Violence: In past 6-12 months Violent Behavior Description: Pt was aggressive towards his mother  Does patient have access to weapons?: No Criminal Charges Pending?: No Does patient have a court date: No  Psychosis Hallucinations: None noted (Pt denies AH ) Delusions: None noted  Mental Status Report Appear/Hygiene: Other (Comment) (Appropriate) Eye Contact: Good Motor Activity: Freedom of movement Speech: Logical/coherent Level of Consciousness: Alert Mood: Other (Comment) (Hopeful) Affect: Appropriate to circumstance;Anxious Anxiety Level: None Thought Processes: Coherent;Relevant Judgement: Impaired Orientation: Person;Place;Time;Situation Obsessive Compulsive Thoughts/Behaviors: None  Cognitive Functioning Concentration: Normal Memory: Remote Intact;Recent Intact IQ: Average Insight: Good Impulse Control: Fair Appetite: Good Weight Loss: 0    Weight Gain: 0  Sleep: No Change Total Hours of Sleep: 8  Vegetative Symptoms: None  Prior Inpatient Therapy Prior Inpatient Therapy: Yes Prior Therapy Dates: 2012 Prior Therapy Facilty/Provider(s): [facility] Reason for Treatment: Mood Disturbance  Prior Outpatient Therapy Prior Outpatient Therapy: Yes Prior Therapy Dates: Present Prior Therapy Facilty/Provider(s): [facility]  Reason for Treatment: Psych, Med mgmt  ADL Screening (condition at time of admission) Patient's cognitive ability adequate to safely complete daily activities?: Yes Patient able to express need for assistance with ADLs?: Yes Independently performs ADLs?: Yes Weakness of Legs: None Weakness of Arms/Hands: None  Home Assistive Devices/Equipment Home Assistive Devices/Equipment: None  Therapy Consults (therapy consults require a physician order) PT Evaluation Needed: No OT Evalulation Needed: No Abuse/Neglect Assessment (Assessment to be complete while patient is alone) Physical Abuse: Denies Verbal Abuse: Denies Sexual Abuse: Denies Exploitation of patient/patient's resources: Denies Self-Neglect: Denies Values / Beliefs Cultural Requests During Hospitalization: None Spiritual Requests During Hospitalization: None Consults Spiritual Care Consult Needed: No Social Work Consult Needed: No Merchant navy officer (For Healthcare) Advance Directive: Patient does not have advance directive;Patient would not like information Pre-existing out of facility DNR order (yellow form or pink MOST form): No Nutrition Screen Diet: Regular  Additional Information 1:1 In Past 12 Months?: No CIRT Risk: No Elopement Risk: No Does patient have medical clearance?: Yes     Disposition: Recommendation for new telepsych consult due to pt exhibiting more stabilized behavior.  Disposition Disposition of Patient: Referred to Type of inpatient treatment program: Adult Patient referred to: Other (Comment) [facility] for  review )  On Site Evaluation by:   Reviewed with Physician:     Janann Colonel C 09/14/2011 4:54 PM

## 2011-09-14 NOTE — ED Provider Notes (Signed)
8:19 PM  The Telepsych evaluation has been completed. In their note they reported that he regrets the events which led to his involuntary commitment. He is compliant with his medications. No evidence of delusional thought process. Patient denied any plan or intent to harm himself or others. According to the psychiatrist. He did not meet IVC criteria. They recommended. Reversal of commitment, community mental health. Recommendations to continue Haldol 5 mg twice a day and Seroquel 600 mg each bedtime.  Plan: Will discuss with patient's family and likely discharge him home.   After a period of observation the patient's family was contacted, and they're willing to take him back home. He'll be discharged accordingly.  Brandon [name] A. Patrica Duel, MD 09/14/11 2148

## 2011-10-18 ENCOUNTER — Emergency Department (HOSPITAL_COMMUNITY)
Admission: EM | Admit: 2011-10-18 | Discharge: 2011-10-19 | Disposition: A | Discharge status: Home / Self Care | Payer: Medicaid Other | Attending: Emergency Medicine | Admitting: Emergency Medicine

## 2011-10-18 ENCOUNTER — Encounter (HOSPITAL_COMMUNITY): Payer: Self-pay | Admitting: Emergency Medicine

## 2011-10-18 DIAGNOSIS — R000 Tachycardia, unspecified: Secondary | ICD-10-CM | POA: Insufficient documentation

## 2011-10-18 DIAGNOSIS — I10 Essential (primary) hypertension: Secondary | ICD-10-CM | POA: Insufficient documentation

## 2011-10-18 DIAGNOSIS — R4689 Other symptoms and signs involving appearance and behavior: Secondary | ICD-10-CM

## 2011-10-18 DIAGNOSIS — F603 Borderline personality disorder: Secondary | ICD-10-CM | POA: Insufficient documentation

## 2011-10-18 LAB — COMPREHENSIVE METABOLIC PANEL WITH GFR
ALT: 38 U/L (ref 0–53)
AST: 21 U/L (ref 0–37)
Albumin: 4.1 g/dL (ref 3.5–5.2)
Alkaline Phosphatase: 133 U/L — ABNORMAL HIGH (ref 39–117)
BUN: 12 mg/dL (ref 6–23)
CO2: 25 meq/L (ref 19–32)
Calcium: 9.5 mg/dL (ref 8.4–10.5)
Chloride: 100 meq/L (ref 96–112)
Creatinine, Ser: 0.8 mg/dL (ref 0.50–1.35)
GFR calc Af Amer: >90 mL/min (ref >90)
GFR calc non Af Amer: >90 mL/min (ref >90)
Glucose, Bld: 151 mg/dL — ABNORMAL HIGH (ref 70–99)
Potassium: 3.3 meq/L — ABNORMAL LOW (ref 3.5–5.1)
Sodium: 137 meq/L (ref 135–145)
Total Bilirubin: 0.3 mg/dL (ref 0.3–1.2)
Total Protein: 7.9 g/dL (ref 6.0–8.3)

## 2011-10-18 LAB — CBC
HCT: 38.8 % — ABNORMAL LOW (ref 39.0–52.0)
Hemoglobin: 12.3 g/dL — ABNORMAL LOW (ref 13.0–17.0)
MCH: 25.2 pg — ABNORMAL LOW (ref 26.0–34.0)
MCHC: 31.7 g/dL (ref 30.0–36.0)
MCV: 79.5 fL (ref 78.0–100.0)
Platelets: 248 K/uL (ref 150–400)
RBC: 4.88 MIL/uL (ref 4.22–5.81)
RDW: 13.7 % (ref 11.5–15.5)
WBC: 7.5 K/uL (ref 4.0–10.5)

## 2011-10-18 LAB — DIFFERENTIAL
Basophils Absolute: 0 K/uL (ref 0.0–0.1)
Basophils Relative: 0 % (ref 0–1)
Eosinophils Absolute: 0.1 K/uL (ref 0.0–0.7)
Eosinophils Relative: 1 % (ref 0–5)
Lymphocytes Relative: 27 % (ref 12–46)
Lymphs Abs: 2 K/uL (ref 0.7–4.0)
Monocytes Absolute: 0.4 K/uL (ref 0.1–1.0)
Monocytes Relative: 5 % (ref 3–12)
Neutro Abs: 5 K/uL (ref 1.7–7.7)
Neutrophils Relative %: 66 % (ref 43–77)

## 2011-10-18 LAB — RAPID URINE DRUG SCREEN, HOSP PERFORMED
Amphetamines: NOT DETECTED
Barbiturates: NOT DETECTED
Benzodiazepines: NOT DETECTED
Cocaine: NOT DETECTED
Opiates: NOT DETECTED
Tetrahydrocannabinol: POSITIVE — AB

## 2011-10-18 LAB — ETHANOL: Alcohol, Ethyl (B): <11 mg/dL (ref 0–11)

## 2011-10-18 MED ORDER — HALOPERIDOL 5 MG PO TABS
5.0000 mg | ORAL_TABLET | Freq: Every day | ORAL | Status: DC
  Administered 2011-10-18: 5 mg via ORAL
  Filled 2011-10-18: qty 1

## 2011-10-18 MED ORDER — LORAZEPAM 1 MG PO TABS: 1.0000 mg | ORAL_TABLET | Freq: Three times a day (TID) | ORAL | Status: DC | PRN

## 2011-10-18 MED ORDER — LISINOPRIL 10 MG PO TABS
10.0000 mg | ORAL_TABLET | Freq: Once | ORAL | Status: AC
  Administered 2011-10-18: 10 mg via ORAL
  Filled 2011-10-18 (×2): qty 1

## 2011-10-18 MED ORDER — ALUM & MAG HYDROXIDE-SIMETH 200-200-20 MG/5ML PO SUSP: 30.0000 mL | ORAL | Status: DC | PRN

## 2011-10-18 MED ORDER — POTASSIUM CHLORIDE 20 MEQ/15ML (10%) PO LIQD
20.0000 meq | Freq: Once | ORAL | Status: AC
  Administered 2011-10-18: 20 meq via ORAL
  Filled 2011-10-18: qty 15

## 2011-10-18 MED ORDER — ZOLPIDEM TARTRATE 5 MG PO TABS: 5.0000 mg | ORAL_TABLET | Freq: Every evening | ORAL | Status: DC | PRN

## 2011-10-18 MED ORDER — IBUPROFEN 200 MG PO TABS: 600.0000 mg | ORAL_TABLET | Freq: Three times a day (TID) | ORAL | Status: DC | PRN

## 2011-10-18 MED ORDER — NICOTINE 21 MG/24HR TD PT24: 21.0000 mg | MEDICATED_PATCH | Freq: Every day | TRANSDERMAL | Status: DC

## 2011-10-18 MED ORDER — ONDANSETRON HCL 4 MG PO TABS: 4.0000 mg | ORAL_TABLET | Freq: Three times a day (TID) | ORAL | Status: DC | PRN

## 2011-10-18 MED ORDER — ACETAMINOPHEN 325 MG PO TABS: 650.0000 mg | ORAL_TABLET | ORAL | Status: DC | PRN
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
## 2011-10-18 NOTE — ED Notes (Signed)
Pt is not suicidal.  Anxiety meds given.
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
## 2011-10-18 NOTE — ED Notes (Signed)
Physiological scientist of need for sitter.  Pt is IVC.  Pt is agreeable and aware of need to stay.  Per Consulting civil engineer and [employer] no sitter available.  [employer] can be released.  Charge RN ok to release [employer].

## 2011-10-18 NOTE — ED Notes (Signed)
Per [employer] IVC papers enroute, pt denies SI,HI. [employer] states mother worried to due pt using K2
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
## 2011-10-19 NOTE — ED Notes (Signed)
Patient is resting comfortably. Sitter at bedside.  

## 2011-10-19 NOTE — ED Notes (Signed)
Faxed and called for telepsych

## 2011-10-19 NOTE — ED Notes (Signed)
Sitter at bedside. Environment secured. 

## 2011-10-19 NOTE — ED Notes (Signed)
Patient is resting comfortably. 

## 2011-10-19 NOTE — Discharge Instructions (Signed)
Aggression  Physically aggressive behavior is common among small children. When frustrated or angry, toddlers may act out. Often, they will push, bite, or hit. Most children show less physical aggression as they grow up. Their language and interpersonal skills improve, too. But continued aggressive behavior is a sign of a problem. This behavior can lead to aggression and delinquency in adolescence and adulthood.  Aggressive behavior can be psychological or physical. Forms of psychological aggression include threatening or bullying others. Forms of physical aggression include:   Pushing.   Hitting.   Slapping.   Kicking.   Stabbing.   Shooting.   Raping.  PREVENTION   Encouraging the following behaviors can help manage aggression:   Respecting others and valuing differences.   Participating in school and community functions, including sports, music, after-school programs, community groups, and volunteer work.   Talking with an adult when they are sad, depressed, fearful, anxious, or angry. Discussions with a parent or other family member, counselor, teacher, or coach can help.   Avoiding alcohol and drug use.   Dealing with disagreements without aggression, such as conflict resolution. To learn this, children need parents and caregivers to model respectful communication and problem solving.   Limiting exposure to aggression and violence, such as video games that are not age appropriate, violence in the media, or domestic violence.  Document Released: 04/20/2007 Document Revised: 06/12/2011 Document Reviewed: 08/29/2010  ExitCare Patient Information 2012 [employer].

## 2011-10-19 NOTE — ED Notes (Signed)
Report received from Night RN. First contact with patient. Pt is sleeping upon entering room, woke up upon introduction. Pt is oriented, denied SI/HI, or any hallucination. Pt IVC, sts wanting to go home.

## 2011-10-19 NOTE — ED Provider Notes (Signed)
10:20 AM Filed Vitals:   10/19/11 0610  BP: 143/83  Pulse: 93  Temp: 98.4 F (36.9 C)  Resp: 20   . [place] site psychiatrist rescinded IVC and felt that patient was safe to be discharged home. I reassessed the patient personally and he had no suicidal or homicidal ideation. He had no hallucinations. He was hemodynamically stable and oriented. Patient Brandon [name] discharged home in good condition.  Diagnosis: Behavior problem.  Cyndra Numbers, MD 10/19/11 1128

## 2011-10-19 NOTE — BHH Counselor (Signed)
Dr. Alto Denver evaluated pt after Tele Psych recommended discharge.  She contacted ACT and ACT provided follow up info to pt.  Pt will return to current provider at [facility] but also provided info on [facility] and the [facility].  Pt confirmed with ACT he was not a danger to self and others and he wished no harm to his mother which was the initial report upon arrival.  Pt contracted for safety.  Pt denied AVH current symptoms.

## 2011-10-31 ENCOUNTER — Encounter (HOSPITAL_COMMUNITY): Payer: Self-pay | Admitting: Emergency Medicine

## 2011-10-31 ENCOUNTER — Emergency Department (HOSPITAL_COMMUNITY)
Admission: EM | Admit: 2011-10-31 | Discharge: 2011-10-31 | Disposition: A | Discharge status: Home / Self Care | Payer: Medicaid Other | Attending: Emergency Medicine | Admitting: Emergency Medicine

## 2011-10-31 DIAGNOSIS — F909 Attention-deficit hyperactivity disorder, unspecified type: Secondary | ICD-10-CM | POA: Insufficient documentation

## 2011-10-31 DIAGNOSIS — F17200 Nicotine dependence, unspecified, uncomplicated: Secondary | ICD-10-CM | POA: Insufficient documentation

## 2011-10-31 DIAGNOSIS — F209 Schizophrenia, unspecified: Secondary | ICD-10-CM | POA: Insufficient documentation

## 2011-10-31 DIAGNOSIS — L298 Other pruritus: Secondary | ICD-10-CM | POA: Insufficient documentation

## 2011-10-31 DIAGNOSIS — M79609 Pain in unspecified limb: Secondary | ICD-10-CM | POA: Insufficient documentation

## 2011-10-31 DIAGNOSIS — I10 Essential (primary) hypertension: Secondary | ICD-10-CM | POA: Insufficient documentation

## 2011-10-31 DIAGNOSIS — L509 Urticaria, unspecified: Secondary | ICD-10-CM | POA: Insufficient documentation

## 2011-10-31 DIAGNOSIS — L2989 Other pruritus: Secondary | ICD-10-CM | POA: Insufficient documentation

## 2011-10-31 DIAGNOSIS — F319 Bipolar disorder, unspecified: Secondary | ICD-10-CM | POA: Insufficient documentation

## 2011-10-31 MED ORDER — DIPHENHYDRAMINE HCL 25 MG PO TABS: 25.0000 mg | ORAL_TABLET | Freq: Three times a day (TID) | ORAL | Status: DC | PRN

## 2011-10-31 NOTE — ED Notes (Signed)
Pt presented to the ER with c/o left arm pain and itching, states sx started about 50 min ago. Pt denies any similar sx in the past. Pt able to move upper extremities, CMS intact, pulses strong and eqaul bilaterally.

## 2011-10-31 NOTE — ED Notes (Addendum)
Pt is observed pulling/twisting at fingers and scratching at both arms.

## 2011-10-31 NOTE — Discharge Instructions (Signed)
Hives Hives (urticaria) are itchy, red, swollen patches on the skin. They may change size, shape, and location quickly and repeatedly. Hives that occur deeper in the skin can cause swelling of the hands, feet, and face. Hives may be an allergic reaction to something you ate, touched, or put on your skin. Hives can also be a reaction to cold, heat, viral infections, medication, insect bites, or emotional stress. Often the cause is hard to find. Hives can come and go for several days to several weeks. Hives are not contagious. HOME CARE INSTRUCTIONS   If the cause of the hives is known, avoid exposure to that source.   To relieve itching and rash:   Apply cold compresses to the skin or take cool water baths. Do not take hot baths or showers because the warmth will make the itching worse.   The best medicine for hives is an antihistamine. An antihistamine will not cure hives, but it will reduce their severity. You can use an antihistamine available over the counter. This medicine may make you sleepy. You should not drive while using this medicine.   Take or give an antihistamine as directed by your pharmacist or caregiver.   Other medications may be prescribed for itching. Use these as directed.   You should wear loose fitting clothing, including undergarments, as skin irritations may make hives worse.   Follow-up as directed by your caregiver.  SEEK MEDICAL CARE IF:   You still have considerable itching after taking the medication (prescribed or purchased over the counter).   Joint swelling or pain occurs.   You are not improving or are getting worse.  SEEK IMMEDIATE MEDICAL CARE IF:   You have a fever.   You notice any swelling of the lips, tongue, face or neck.   You have shortness of breath, difficulty breathing or swallowing, or tightness in the throat or chest.   Belly (abdominal) pain develops.   You are feeling very sick.   Your hives continue to spread.  These may be the  first signs of a life-threatening allergic reaction. THIS IS AN EMERGENCY. Call 911 for medical help. Document Released: 07/31/2004 Document Revised: 06/12/2011 Document Reviewed: 07/18/2008 Exit[employer] Patient Information 2012 [employer]. 

## 2011-11-01 NOTE — ED Provider Notes (Signed)
History     CSN: [id number]  Arrival date & time 10/31/11  0008   First MD Initiated Contact with Patient 10/31/11 [phone]      Chief Complaint  Patient presents with  . Arm Pain  . Pruritis    (Consider location/radiation/quality/duration/timing/severity/associated sxs/prior treatment) HPI 21 year old male presents to emergency department complaining of diffuse itchy rash to bilateral arms. Patient reports symptoms started about an hour before arrival to the emergency department. Here it denies any travel, no new soaps detergents, no new foods or other contact allergens. No prior history of same. patient has not taken any medication prior to arrival. Past Medical History  Diagnosis Date  . ADHD (attention deficit hyperactivity disorder)   . Hypertension   . Schizophrenia   . Bipolar 1 disorder     History reviewed. No pertinent past surgical history.  History reviewed. No pertinent family history.  History  Substance Use Topics  . Smoking status: Current Everyday Smoker    Types: Cigarettes  . Smokeless tobacco: Not on file  . Alcohol Use: No     Pt denies      Review of Systems  Unable to perform ROS: Psychiatric disorder    Allergies  Review of patient's allergies indicates no known allergies.  Home Medications   Current Outpatient Rx  Name Route Sig Dispense Refill  . ARIPIPRAZOLE 5 MG PO TABS Oral Take 5 mg by mouth daily.    . ASPIRIN 325 MG PO TABS Oral Take 325 mg by mouth daily.    Marland Kitchen LISINOPRIL 10 MG PO TABS Oral Take 1 tablet (10 mg total) by mouth daily. 30 tablet 0  . TRAZODONE HCL 150 MG PO TABS Oral Take 150 mg by mouth at bedtime.    Marland Kitchen DIPHENHYDRAMINE HCL 25 MG PO TABS Oral Take 1 tablet (25 mg total) by mouth every 8 (eight) hours as needed for itching. 20 tablet 0    BP 141/83  Pulse 86  Temp(Src) 97.5 F (36.4 C) (Oral)  Resp 20  SpO2 100%  Physical Exam  Nursing note and vitals reviewed. Constitutional: He appears well-developed and  well-nourished. No distress.  HENT:  Head: Normocephalic and atraumatic.  Cardiovascular: Normal rate, regular rhythm, normal heart sounds and intact distal pulses.  Exam reveals no gallop and no friction rub.   No murmur heard. Pulmonary/Chest: Effort normal and breath sounds normal. No respiratory distress. He has no wheezes. He has no rales. He exhibits no tenderness.  Musculoskeletal: Normal range of motion. He exhibits no edema and no tenderness.  Skin: Rash (diffuse urticaria to arms) noted.    ED Course  Procedures (including critical care time)  Labs Reviewed - No data to display No results found.   1. Urticaria       MDM  21 year old male with mild urticaria that is resolving in the emergency department without intervention. We'll place on Benadryl for any further itching.       Olivia Mackie, MD 11/01/11 0630

## 2012-08-14 ENCOUNTER — Emergency Department (HOSPITAL_COMMUNITY)
Admission: EM | Admit: 2012-08-14 | Discharge: 2012-08-15 | Disposition: A | Discharge status: Home / Self Care | Payer: Medicaid Other | Attending: Emergency Medicine | Admitting: Emergency Medicine

## 2012-08-14 ENCOUNTER — Encounter (HOSPITAL_COMMUNITY): Payer: Self-pay | Admitting: Adult Health

## 2012-08-14 DIAGNOSIS — F319 Bipolar disorder, unspecified: Secondary | ICD-10-CM | POA: Insufficient documentation

## 2012-08-14 DIAGNOSIS — R443 Hallucinations, unspecified: Secondary | ICD-10-CM | POA: Insufficient documentation

## 2012-08-14 DIAGNOSIS — F1210 Cannabis abuse, uncomplicated: Secondary | ICD-10-CM | POA: Insufficient documentation

## 2012-08-14 DIAGNOSIS — Z8659 Personal history of other mental and behavioral disorders: Secondary | ICD-10-CM | POA: Insufficient documentation

## 2012-08-14 DIAGNOSIS — Z79899 Other long term (current) drug therapy: Secondary | ICD-10-CM | POA: Insufficient documentation

## 2012-08-14 DIAGNOSIS — F209 Schizophrenia, unspecified: Secondary | ICD-10-CM | POA: Insufficient documentation

## 2012-08-14 DIAGNOSIS — F17200 Nicotine dependence, unspecified, uncomplicated: Secondary | ICD-10-CM | POA: Insufficient documentation

## 2012-08-14 DIAGNOSIS — I10 Essential (primary) hypertension: Secondary | ICD-10-CM | POA: Insufficient documentation

## 2012-08-14 DIAGNOSIS — H5316 Psychophysical visual disturbances: Secondary | ICD-10-CM | POA: Insufficient documentation

## 2012-08-14 LAB — COMPREHENSIVE METABOLIC PANEL
ALT: 29 U/L (ref 0–53)
AST: 16 U/L (ref 0–37)
Albumin: 4 g/dL (ref 3.5–5.2)
Alkaline Phosphatase: 86 U/L (ref 39–117)
BUN: 11 mg/dL (ref 6–23)
CO2: 28 mEq/L (ref 19–32)
Calcium: 9.3 mg/dL (ref 8.4–10.5)
Chloride: 103 mEq/L (ref 96–112)
Creatinine, Ser: 1.07 mg/dL (ref 0.50–1.35)
GFR calc Af Amer: >90 mL/min (ref >90)
GFR calc non Af Amer: >90 mL/min (ref >90)
Glucose, Bld: 106 mg/dL — ABNORMAL HIGH (ref 70–99)
Potassium: 3.6 mEq/L (ref 3.5–5.1)
Sodium: 140 mEq/L (ref 135–145)
Total Bilirubin: 0.2 mg/dL — ABNORMAL LOW (ref 0.3–1.2)
Total Protein: 7.3 g/dL (ref 6.0–8.3)

## 2012-08-14 LAB — CBC
HCT: 42 % (ref 39.0–52.0)
Hemoglobin: 13.7 g/dL (ref 13.0–17.0)
MCH: 26.4 pg (ref 26.0–34.0)
MCHC: 32.6 g/dL (ref 30.0–36.0)
MCV: 81.1 fL (ref 78.0–100.0)
Platelets: 172 10*3/uL (ref 150–400)
RBC: 5.18 MIL/uL (ref 4.22–5.81)
RDW: 14.4 % (ref 11.5–15.5)
WBC: 9.9 10*3/uL (ref 4.0–10.5)

## 2012-08-14 LAB — RAPID URINE DRUG SCREEN, HOSP PERFORMED
Amphetamines: NOT DETECTED
Barbiturates: NOT DETECTED
Benzodiazepines: NOT DETECTED
Cocaine: NOT DETECTED
Opiates: NOT DETECTED
Tetrahydrocannabinol: POSITIVE — AB

## 2012-08-14 LAB — ACETAMINOPHEN LEVEL: Acetaminophen (Tylenol), Serum: <15 ug/mL (ref 10–30)

## 2012-08-14 LAB — ETHANOL: Alcohol, Ethyl (B): <11 mg/dL (ref 0–11)

## 2012-08-14 LAB — SALICYLATE LEVEL: Salicylate Lvl: <2 mg/dL — ABNORMAL LOW (ref 2.8–20.0)

## 2012-08-14 MED ORDER — IBUPROFEN 400 MG PO TABS: 600.0000 mg | ORAL_TABLET | Freq: Three times a day (TID) | ORAL | Status: DC | PRN

## 2012-08-14 MED ORDER — TRAZODONE HCL 50 MG PO TABS: 150.0000 mg | ORAL_TABLET | Freq: Every day | ORAL | Status: DC

## 2012-08-14 MED ORDER — ACETAMINOPHEN 325 MG PO TABS: 650.0000 mg | ORAL_TABLET | ORAL | Status: DC | PRN

## 2012-08-14 MED ORDER — ALUM & MAG HYDROXIDE-SIMETH 200-200-20 MG/5ML PO SUSP: 30.0000 mL | ORAL | Status: DC | PRN

## 2012-08-14 MED ORDER — ZOLPIDEM TARTRATE 5 MG PO TABS: 5.0000 mg | ORAL_TABLET | Freq: Every evening | ORAL | Status: DC | PRN

## 2012-08-14 MED ORDER — LORAZEPAM 1 MG PO TABS: 1.0000 mg | ORAL_TABLET | Freq: Three times a day (TID) | ORAL | Status: DC | PRN

## 2012-08-14 MED ORDER — ASPIRIN 325 MG PO TABS: 325.0000 mg | ORAL_TABLET | Freq: Every day | ORAL | Status: DC

## 2012-08-14 MED ORDER — ONDANSETRON HCL 8 MG PO TABS: 4.0000 mg | ORAL_TABLET | Freq: Three times a day (TID) | ORAL | Status: DC | PRN

## 2012-08-14 MED ORDER — NICOTINE 21 MG/24HR TD PT24: 21.0000 mg | MEDICATED_PATCH | Freq: Every day | TRANSDERMAL | Status: DC

## 2012-08-14 MED ORDER — ARIPIPRAZOLE 5 MG PO TABS: 5.0000 mg | ORAL_TABLET | Freq: Every day | ORAL | Status: DC

## 2012-08-14 MED ORDER — DIPHENHYDRAMINE HCL 25 MG PO CAPS: 25.0000 mg | ORAL_CAPSULE | Freq: Three times a day (TID) | ORAL | Status: DC | PRN

## 2012-08-14 NOTE — ED Notes (Signed)
Pt reports he has been hearing voice but that is normal for him but today he was "shaking like he had pneumonia and thought he was going to die"??, pt denies it was an anxiety attack. Pt denies SI/HI, pt denies having a plan to kill self. Pt sts he has multiple psychiatric disorders and all of these things are normal for him. Pt has red eyes, pt calm and cooperative but gets up and wanders, needs redirection then he is fine. Pt has delayed responses.   Pt's father reports he is supposed to have one of his medications dosages go up next week and thinks that will fix the problem.

## 2012-08-14 NOTE — ED Notes (Signed)
While attempting to take pts vitals in triage pt stood up and tried to walk away.  Pt eyes are red and has a blank stare with no response.  Father pushed pt back in chair and stated to pt. "sit down man".  [employer] was called.

## 2012-08-14 NOTE — ED Notes (Signed)
Presenets with hallucinations auditory and visual, Schitzophrenia episode for at tleast 2 nights. Admits to smoking marijuana,  Pt is unable to answer questions, stares when asked a question. Flat affect.

## 2012-08-14 NOTE — ED Notes (Signed)
ACT team with patient at this time.

## 2012-08-14 NOTE — ED Provider Notes (Signed)
History     CSN: [id number]  Arrival date & time 08/14/12  2034   None     Chief Complaint  Patient presents with  . Medical Clearance    (Consider location/radiation/quality/duration/timing/severity/associated sxs/prior treatment) HPI  Brandon [name] is a 22 y.o. male with past medical history significant for schizophrenia and bipolar 1 disorder brought in by parents who believes he is having a schizophrenia episode for the last 2 days. He states that he is having auditory and visual hallucinations and disorganized behavior. Patient admits to smoking marijuana..Content disorganized intermittently communicative, level V caveat secondary to mental disorder.   Past Medical History  Diagnosis Date  . ADHD (attention deficit hyperactivity disorder)   . Hypertension   . Schizophrenia   . Bipolar 1 disorder     History reviewed. No pertinent past surgical history.  History reviewed. No pertinent family history.  History  Substance Use Topics  . Smoking status: Current Every Day Smoker    Types: Cigarettes  . Smokeless tobacco: Not on file  . Alcohol Use: No     Comment: Pt denies      Review of Systems  Unable to perform ROS: Psychiatric disorder    Allergies  Review of patient's allergies indicates no known allergies.  Home Medications   Current Outpatient Rx  Name  Route  Sig  Dispense  Refill  . ARIPiprazole (ABILIFY) 5 MG tablet   Oral   Take 5 mg by mouth daily.         Marland Kitchen aspirin 325 MG tablet   Oral   Take 325 mg by mouth daily.         . traZODone (DESYREL) 150 MG tablet   Oral   Take 150 mg by mouth at bedtime.         Marland Kitchen EXPIRED: diphenhydrAMINE (BENADRYL) 25 MG tablet   Oral   Take 1 tablet (25 mg total) by mouth every 8 (eight) hours as needed for itching.   20 tablet   0     BP 169/82  Pulse 92  Temp(Src) 98.8 F (37.1 C) (Oral)  Resp 18  SpO2 98%  Physical Exam  Nursing note and vitals reviewed. Constitutional: He is  oriented to person, place, and time. He appears well-developed and well-nourished. No distress.  HENT:  Head: Normocephalic and atraumatic.  Right Ear: External ear normal.  Left Ear: External ear normal.  Mouth/Throat: Oropharynx is clear and moist.  Eyes: EOM are normal. Pupils are equal, round, and reactive to light.  Bilateral bulbar conjunctiva are injected  Neck: Normal range of motion.  Cardiovascular: Normal rate, regular rhythm, normal heart sounds and intact distal pulses.   Pulmonary/Chest: Effort normal and breath sounds normal. No stridor. No respiratory distress. He has no wheezes. He has no rales. He exhibits no tenderness.  Abdominal: Soft. Bowel sounds are normal. He exhibits no distension and no mass. There is no tenderness. There is no rebound and no guarding.  Musculoskeletal: Normal range of motion.  Neurological: He is alert and oriented to person, place, and time.  Psychiatric: His affect is blunt. He is not slowed and not withdrawn. Cognition and memory are impaired. He is communicative. He is attentive.    ED Course  Procedures (including critical care time)  Labs Reviewed  COMPREHENSIVE METABOLIC PANEL - Abnormal; Notable for the following:    Glucose, Bld 106 (*)    Total Bilirubin 0.2 (*)    All other components within normal limits  SALICYLATE LEVEL - Abnormal; Notable for the following:    Salicylate Lvl <2.0 (*)    All other components within normal limits  URINE RAPID DRUG SCREEN (HOSP PERFORMED) - Abnormal; Notable for the following:    Tetrahydrocannabinol POSITIVE (*)    All other components within normal limits  ACETAMINOPHEN LEVEL  CBC  ETHANOL   No results found.   1. Marijuana abuse       MDM   Patient withdrawn, disorganized thoughts, nonresponsive, hallucinating having a psychotic episode.  Blood work is unremarkable, urine drug screen is positive for THC is anticipated, patient is medically cleared for psychiatric evaluation.   Holding orders placed, tele psych and act team consulted     Wynetta Emery, PA-C 08/15/12 1702

## 2012-08-15 NOTE — BH Assessment (Signed)
Assessment Note   Brandon [name] is an 22 y.o. male.  Patient was brought in by father to [facility].  Father reports that patient has been responding to internal stimuli.  Father said patient has been hearing voices and that they are command hallucinations.  Patient says that he can hear voices but they are not telling him to do things.  Patient denies visual hallucinations.  According to father patient will wake in the night and yell that people are coming to get him.  He will go through panic attacks where he thinks someone is going to do him some harm  Patient is unclear about whether he does this.  Patient gives poor eye contact and appears to have a blunted affect.  Patient moves slowly and takes a couple of times to be redirected to pay attention.  Patient does deny SI and HI.  Patient has medication monitoring through [facility] and has an appointment before the end of February.  Patient does keep appointments according to father.  This clinician talked about criteria for inpatient care.  Patient is not currently meeting this criteria and Dr. Ranae Palms is in agreement.  Patient felt, as did father, that he would be safe going back home.  Patient & father were given referral information for Mobile Crisis Management should the service be needed.  Patient is to be discharged home and to follow up with [facility] regarding on-going medicine needs. Axis I: Major Depression, Recurrent severe Axis II: Deferred Axis III:  Past Medical History  Diagnosis Date  . ADHD (attention deficit hyperactivity disorder)   . Hypertension   . Schizophrenia   . Bipolar 1 disorder    Axis IV: problems related to legal system/crime Axis V: 51-60 moderate symptoms  Past Medical History:  Past Medical History  Diagnosis Date  . ADHD (attention deficit hyperactivity disorder)   . Hypertension   . Schizophrenia   . Bipolar 1 disorder     History reviewed. No pertinent past surgical history.  Family History: History  reviewed. No pertinent family history.  Social History:  reports that he has been smoking Cigarettes.  He has been smoking about 0.00 packs per day. He does not have any smokeless tobacco history on file. He reports that he does not drink alcohol or use illicit drugs.  Additional Social History:  Alcohol / Drug Use Pain Medications: See mediication reconcilliation Prescriptions: Father did not have the list with him.  Says that patient has an psychiatry appt before end of week. Over the Counter: Unknown History of alcohol / drug use?: Yes Substance #1 Name of Substance 1: Marijuana 1 - Age of First Use: Teens 1 - Amount (size/oz): One blunt 1 - Frequency: 3-4 times per week 1 - Duration: On-going  1 - Last Use / Amount: Today, 02/08  CIWA: CIWA-Ar BP: 135/76 mmHg Pulse Rate: 74 COWS:    Allergies: No Known Allergies  Home Medications:  (Not in a hospital admission)  OB/GYN Status:  No LMP for male patient.  General Assessment Data Location of Assessment: [facility] ED ACT Assessment: Yes Living Arrangements: Parent (Parents and brother) Can pt return to current living arrangement?: Yes Admission Status: Voluntary Is patient capable of signing voluntary admission?: Yes Transfer from: Acute Hospital Referral Source: Self/Family/Friend     Risk to self Suicidal Ideation: No Suicidal Intent: No Is patient at risk for suicide?: No Suicidal Plan?: No Access to Means: No What has been your use of drugs/alcohol within the last 12 months?: Regular  use of THC Previous Attempts/Gestures: No How many times?: 0 Other Self Harm Risks: SA issues Triggers for Past Attempts: None known Intentional Self Injurious Behavior: None Family Suicide History: No Recent stressful life event(s):  (Pt denies any current stressors) Persecutory voices/beliefs?: Yes Depression: Yes Depression Symptoms: Despondent;Loss of interest in usual pleasures;Feeling worthless/self pity Substance abuse  history and/or treatment for substance abuse?: No Suicide prevention information given to non-admitted patients: Not applicable  Risk to Others Homicidal Ideation: No Thoughts of Harm to Others: No Current Homicidal Intent: No Current Homicidal Plan: No Access to Homicidal Means: No Identified Victim: No one History of harm to others?: No Assessment of Violence: None Noted Violent Behavior Description: None reported Does patient have access to weapons?: No Criminal Charges Pending?: Yes Describe Pending Criminal Charges: B&E and possess of stolen goods Does patient have a court date: Yes Court Date: 08/18/12  Psychosis Hallucinations: Auditory Delusions: Persecutory  Mental Status Report Appear/Hygiene:  (Casual) Eye Contact: Poor Motor Activity: Freedom of movement (Slow movements, restless at times) Speech: Soft;Slow;Slurred Level of Consciousness: Quiet/awake;Drowsy Mood: Depressed Affect: Blunted Anxiety Level: Severe Thought Processes: Relevant Judgement: Impaired Orientation: Person;Place Obsessive Compulsive Thoughts/Behaviors: None  Cognitive Functioning Concentration: Decreased Memory: Recent Impaired;Remote Intact IQ: Average Insight: Poor Impulse Control: Poor Appetite: Good Weight Loss: 0 Weight Gain: 0 Sleep: Decreased Total Hours of Sleep:  (<6H/D) Vegetative Symptoms: None  ADLScreening [facility] Assessment Services) Patient's cognitive ability adequate to safely complete daily activities?: Yes Patient able to express need for assistance with ADLs?: Yes Independently performs ADLs?: Yes (appropriate for developmental age)  Abuse/Neglect [facility]) Physical Abuse: Denies Verbal Abuse: Denies Sexual Abuse: Denies  Prior Inpatient Therapy Prior Inpatient Therapy: Yes Prior Therapy Dates: Parents was unsure Prior Therapy Facilty/Provider(s): [facility] Reason for Treatment: psychocis  Prior Outpatient Therapy Prior Outpatient Therapy: Yes Prior Therapy  Dates: Current Prior Therapy Facilty/Provider(s): [facility] Reason for Treatment: Med management  ADL Screening (condition at time of admission) Patient's cognitive ability adequate to safely complete daily activities?: Yes Patient able to express need for assistance with ADLs?: Yes Independently performs ADLs?: Yes (appropriate for developmental age) Weakness of Legs: None Weakness of Arms/Hands: None  Home Assistive Devices/Equipment Home Assistive Devices/Equipment: None    Abuse/Neglect Assessment (Assessment to be complete while patient is alone) Physical Abuse: Denies Verbal Abuse: Denies Sexual Abuse: Denies Exploitation of patient/patient's resources: Denies Self-Neglect: Denies Values / Beliefs Cultural Requests During Hospitalization: None Spiritual Requests During Hospitalization: None   Advance Directives (For Healthcare) Advance Directive: Patient does not have advance directive;Patient would not like information    Additional Information 1:1 In Past 12 Months?: No CIRT Risk: No Elopement Risk: No Does patient have medical clearance?: Yes     Disposition:  Disposition Disposition of Patient: Referred to Patient referred to: Other (Comment) Vesta Mixer is current provider)  On Site Evaluation by:   Reviewed with Physician:  Dr. Arty Baumgartner, Berna Spare Ray 08/15/2012 1:49 AM

## 2012-08-15 NOTE — ED Notes (Signed)
ACT Team staff in to see pt and case reviewed with EDP and d/c orders rec'd from EDP

## 2012-08-16 NOTE — ED Provider Notes (Signed)
Medical screening examination/treatment/procedure(s) were performed by non-physician practitioner and as supervising physician I was immediately available for consultation/collaboration.   Iliya Spivack, MD 08/16/12 1536 

## 2012-09-02 ENCOUNTER — Encounter (HOSPITAL_COMMUNITY): Payer: Self-pay

## 2012-09-02 ENCOUNTER — Emergency Department (HOSPITAL_COMMUNITY)
Admission: EM | Admit: 2012-09-02 | Discharge: 2012-09-02 | Disposition: A | Discharge status: Home / Self Care | Payer: Medicaid Other | Attending: Emergency Medicine | Admitting: Emergency Medicine

## 2012-09-02 DIAGNOSIS — Z79899 Other long term (current) drug therapy: Secondary | ICD-10-CM | POA: Insufficient documentation

## 2012-09-02 DIAGNOSIS — I10 Essential (primary) hypertension: Secondary | ICD-10-CM | POA: Insufficient documentation

## 2012-09-02 DIAGNOSIS — F1210 Cannabis abuse, uncomplicated: Secondary | ICD-10-CM

## 2012-09-02 DIAGNOSIS — F17200 Nicotine dependence, unspecified, uncomplicated: Secondary | ICD-10-CM | POA: Insufficient documentation

## 2012-09-02 DIAGNOSIS — R440 Auditory hallucinations: Secondary | ICD-10-CM

## 2012-09-02 DIAGNOSIS — F319 Bipolar disorder, unspecified: Secondary | ICD-10-CM | POA: Insufficient documentation

## 2012-09-02 DIAGNOSIS — Z7982 Long term (current) use of aspirin: Secondary | ICD-10-CM | POA: Insufficient documentation

## 2012-09-02 DIAGNOSIS — F911 Conduct disorder, childhood-onset type: Secondary | ICD-10-CM | POA: Insufficient documentation

## 2012-09-02 DIAGNOSIS — F29 Unspecified psychosis not due to a substance or known physiological condition: Secondary | ICD-10-CM

## 2012-09-02 DIAGNOSIS — Z8659 Personal history of other mental and behavioral disorders: Secondary | ICD-10-CM | POA: Insufficient documentation

## 2012-09-02 DIAGNOSIS — F209 Schizophrenia, unspecified: Secondary | ICD-10-CM | POA: Insufficient documentation

## 2012-09-02 DIAGNOSIS — R443 Hallucinations, unspecified: Secondary | ICD-10-CM | POA: Insufficient documentation

## 2012-09-02 LAB — RAPID URINE DRUG SCREEN, HOSP PERFORMED
Amphetamines: NOT DETECTED
Barbiturates: NOT DETECTED
Benzodiazepines: NOT DETECTED
Cocaine: NOT DETECTED
Opiates: NOT DETECTED
Tetrahydrocannabinol: POSITIVE — AB

## 2012-09-02 LAB — CBC
HCT: 42.3 % (ref 39.0–52.0)
Hemoglobin: 13.9 g/dL (ref 13.0–17.0)
MCH: 26.4 pg (ref 26.0–34.0)
MCHC: 32.9 g/dL (ref 30.0–36.0)
MCV: 80.3 fL (ref 78.0–100.0)
Platelets: 192 10*3/uL (ref 150–400)
RBC: 5.27 MIL/uL (ref 4.22–5.81)
RDW: 14 % (ref 11.5–15.5)
WBC: 9.5 10*3/uL (ref 4.0–10.5)

## 2012-09-02 LAB — COMPREHENSIVE METABOLIC PANEL
ALT: 27 U/L (ref 0–53)
AST: 16 U/L (ref 0–37)
Albumin: 3.7 g/dL (ref 3.5–5.2)
Alkaline Phosphatase: 82 U/L (ref 39–117)
BUN: 13 mg/dL (ref 6–23)
CO2: 25 mEq/L (ref 19–32)
Calcium: 9 mg/dL (ref 8.4–10.5)
Chloride: 105 mEq/L (ref 96–112)
Creatinine, Ser: 0.92 mg/dL (ref 0.50–1.35)
GFR calc Af Amer: >90 mL/min (ref >90)
GFR calc non Af Amer: >90 mL/min (ref >90)
Glucose, Bld: 91 mg/dL (ref 70–99)
Potassium: 3.7 mEq/L (ref 3.5–5.1)
Sodium: 139 mEq/L (ref 135–145)
Total Bilirubin: 0.3 mg/dL (ref 0.3–1.2)
Total Protein: 7.1 g/dL (ref 6.0–8.3)

## 2012-09-02 LAB — ETHANOL: Alcohol, Ethyl (B): <11 mg/dL (ref 0–11)

## 2012-09-02 LAB — ACETAMINOPHEN LEVEL: Acetaminophen (Tylenol), Serum: <15 ug/mL (ref 10–30)

## 2012-09-02 LAB — SALICYLATE LEVEL: Salicylate Lvl: <2 mg/dL — ABNORMAL LOW (ref 2.8–20.0)

## 2012-09-02 MED ORDER — TRAZODONE HCL 50 MG PO TABS: 150.0000 mg | ORAL_TABLET | Freq: Every day | ORAL | Status: DC

## 2012-09-02 MED ORDER — ALUM & MAG HYDROXIDE-SIMETH 200-200-20 MG/5ML PO SUSP: 30.0000 mL | ORAL | Status: DC | PRN

## 2012-09-02 MED ORDER — ACETAMINOPHEN 325 MG PO TABS: 650.0000 mg | ORAL_TABLET | ORAL | Status: DC | PRN

## 2012-09-02 MED ORDER — ONDANSETRON HCL 4 MG PO TABS: 4.0000 mg | ORAL_TABLET | Freq: Three times a day (TID) | ORAL | Status: DC | PRN

## 2012-09-02 MED ORDER — ZIPRASIDONE MESYLATE 20 MG IM SOLR: 20.0000 mg | Freq: Once | INTRAMUSCULAR | Status: DC | PRN

## 2012-09-02 MED ORDER — LORAZEPAM 1 MG PO TABS
1.0000 mg | ORAL_TABLET | Freq: Three times a day (TID) | ORAL | Status: DC | PRN
  Administered 2012-09-02: 1 mg via ORAL
  Filled 2012-09-02: qty 1

## 2012-09-02 MED ORDER — ARIPIPRAZOLE 5 MG PO TABS: 5.0000 mg | ORAL_TABLET | Freq: Every day | ORAL | Status: DC

## 2012-09-02 MED ORDER — ZIPRASIDONE MESYLATE 20 MG IM SOLR: 20.0000 mg | Freq: Once | INTRAMUSCULAR | Status: DC

## 2012-09-02 MED ORDER — TRAZODONE HCL 150 MG PO TABS: 150.0000 mg | ORAL_TABLET | Freq: Every day | ORAL | Status: DC

## 2012-09-02 MED ORDER — IBUPROFEN 600 MG PO TABS: 600.0000 mg | ORAL_TABLET | Freq: Three times a day (TID) | ORAL | Status: DC | PRN

## 2012-09-02 MED ORDER — NICOTINE 21 MG/24HR TD PT24: 21.0000 mg | MEDICATED_PATCH | Freq: Every day | TRANSDERMAL | Status: DC

## 2012-09-02 MED ORDER — LORAZEPAM 2 MG/ML IJ SOLN
2.0000 mg | Freq: Once | INTRAMUSCULAR | Status: AC
  Administered 2012-09-02: 2 mg via INTRAMUSCULAR
  Filled 2012-09-02: qty 1

## 2012-09-02 MED ORDER — ZOLPIDEM TARTRATE 5 MG PO TABS: 5.0000 mg | ORAL_TABLET | Freq: Every evening | ORAL | Status: DC | PRN

## 2012-09-02 MED ORDER — ZIPRASIDONE MESYLATE 20 MG IM SOLR
INTRAMUSCULAR | Status: AC
  Administered 2012-09-02: 20 mg via INTRAMUSCULAR
  Filled 2012-09-02: qty 20

## 2012-09-02 NOTE — ED Notes (Signed)
Pt arrives with [employer].  States called out to his residence by his parents.  [employer] states that pt is aggressive and violent-stating he has been yelling.  Pt stating he is hearing voices to kill himself.  Pt is currently here voluntary.

## 2012-09-02 NOTE — BH Assessment (Signed)
Assessment Note   Brandon [name] is an 22 y.o. male. Patient with history of schizophrenia and Bipolar presents to [facility] via [facility]. Patient is here for a psychiatric evaluation. [employer] were called out to patients home as he  was reportedly aggressive and violent in the home. Patient denies stating that he has never been violent at home or toward anyone in the home. He denies HI.  Patient denies SI. However, admits to self mutilation. He has several old scars from cutting which started as a young child. He last cut himself months ago.    Patient admits that he has daily auditory hallucinations onset was early childhood. He reports the voices are not command but tell him demeaning comments. Examples include: "You are worthless, pitiful, stupid.Marland KitchenMarland KitchenYou are nothing". Although patient denies during the assessment that his voices are not command EDP notes indicate otherwise. ED notes state that voices are command type hallucinations telling patient to harm himself. Patient says that the when the voices start he goes to sleep and when he wakes up they are gone. Patient is clearly responding to internal stimuli, and has a difficult time answering my questions. He is displaying thought blocking. He is also easily distracted during the assessment and when asked a question he seems despondent often stating, "Can you repeat that question again". Patient is also flirtatious with this writer during the assessment staring inappropriately and making sexual-like facial expressions.   Patient denies alcohol use. Patients UDS is positive for THC, however; he denies use and asked if this information could be removed from his chart. Patient says, "I'm trying to get a check and I can't have that information in my chart".   Axis I: Schizophrenia, Bipolar I Disorder, and ADHD Axis II: Deferred Axis III:  Past Medical History  Diagnosis Date  . ADHD (attention deficit hyperactivity disorder)   . Hypertension   . Schizophrenia    . Bipolar 1 disorder    Axis IV: problems related to social environment Axis V: 31-40 impairment in reality testing  Past Medical History:  Past Medical History  Diagnosis Date  . ADHD (attention deficit hyperactivity disorder)   . Hypertension   . Schizophrenia   . Bipolar 1 disorder     History reviewed. No pertinent past surgical history.  Family History: History reviewed. No pertinent family history.  Social History:  reports that he has been smoking Cigarettes.  He has been smoking about 0.00 packs per day. He does not have any smokeless tobacco history on file. He reports that he does not drink alcohol or use illicit drugs.  Additional Social History:  Alcohol / Drug Use Pain Medications: SEE MAR Prescriptions: SEE MAR Over the Counter: SEE MAR History of alcohol / drug use?: Yes Longest period of sobriety (when/how long): None Reported Substance #1 Name of Substance 1: Patients UDS is positive for THC, however; he denies use. Sts, "Can you erase that because I'm trying to get my social security check". 1 - Age of First Use: unk 1 - Amount (size/oz): unk 1 - Frequency: unk 1 - Duration: unk 1 - Last Use / Amount: unk  CIWA: CIWA-Ar BP: 135/78 mmHg Pulse Rate: 67 COWS:    Allergies: No Known Allergies  Home Medications:  (Not in a hospital admission)  OB/GYN Status:  No LMP for male patient.  General Assessment Data Location of Assessment: [facility] ACT Assessment: Yes Living Arrangements: Parent;Other relatives;Other (Comment) (Mother, Father, and Kateri Mc) Can pt return to current living arrangement?:  Yes Admission Status: Voluntary Is patient capable of signing voluntary admission?: Yes Transfer from: Acute Hospital Referral Source: Self/Family/Friend     Risk to self Suicidal Ideation: No Suicidal Intent: No Is patient at risk for suicide?: No Suicidal Plan?: No Access to Means: No Specify Access to Suicidal Means:  (n/a) What has been your use of  drugs/alcohol within the last 12 months?:  (UDS positive for THC; pt denies use) Previous Attempts/Gestures: No How many times?:  (0) Other Self Harm Risks:  (cutting) Triggers for Past Attempts: Other (Comment) (no previous suicide attempts and/or gestures) Intentional Self Injurious Behavior: None Family Suicide History: No Recent stressful life event(s): Other (Comment);Job Loss;Financial Problems ("I'm trying to get a disability check") Persecutory voices/beliefs?: Yes Depression: No Substance abuse history and/or treatment for substance abuse?: Yes Suicide prevention information given to non-admitted patients: Not applicable  Risk to Others Homicidal Ideation: No Thoughts of Harm to Others: No Current Homicidal Intent: No Current Homicidal Plan: No Access to Homicidal Means: No Identified Victim:  (n/a) History of harm to others?: Yes Assessment of Violence: None Noted Violent Behavior Description:  (Patient has remained calm and cooperative in the ED) Does patient have access to weapons?: No Criminal Charges Pending?: Yes Describe Pending Criminal Charges:  (Breaking and Entering; Possession of Stolen Goods) Does patient have a court date: Yes Court Date:  (September 18, 2012)  Psychosis Hallucinations: Auditory Delusions: Unspecified (Voices tell me, "Your nothing..never going to be anytihng")  Mental Status Report Appear/Hygiene: Other (Comment) (casual) Eye Contact: Poor Motor Activity: Freedom of movement Speech: Soft;Slow;Slurred Level of Consciousness: Quiet/awake;Drowsy Mood: Depressed Affect: Blunted Anxiety Level: Severe Thought Processes: Relevant Judgement: Impaired Orientation: Person;Place Obsessive Compulsive Thoughts/Behaviors: None  Cognitive Functioning Concentration: Decreased Memory: Recent Intact;Remote Intact IQ: Average Insight: Poor Impulse Control: Poor Appetite: Good Weight Loss:  (0) Weight Gain:  (0) Sleep: Decreased Total Hours of  Sleep:  (2-4 hours per night) Vegetative Symptoms: None  ADLScreening [facility] Assessment Services) Patient's cognitive ability adequate to safely complete daily activities?: Yes Patient able to express need for assistance with ADLs?: Yes Independently performs ADLs?: Yes (appropriate for developmental age)  Abuse/Neglect [facility]) Physical Abuse: Denies Verbal Abuse: Denies Sexual Abuse: Denies  Prior Inpatient Therapy Prior Inpatient Therapy: Yes Prior Therapy Dates: Parents was unsure Prior Therapy Facilty/Provider(s): [facility] Reason for Treatment: psychocis  Prior Outpatient Therapy Prior Outpatient Therapy: Yes Prior Therapy Dates: Current Prior Therapy Facilty/Provider(s): [facility] [employer] [facility]) Reason for Treatment: Med management  ADL Screening (condition at time of admission) Patient's cognitive ability adequate to safely complete daily activities?: Yes Patient able to express need for assistance with ADLs?: Yes Independently performs ADLs?: Yes (appropriate for developmental age) Weakness of Legs: None Weakness of Arms/Hands: None  Home Assistive Devices/Equipment Home Assistive Devices/Equipment: None    Abuse/Neglect Assessment (Assessment to be complete while patient is alone) Physical Abuse: Denies Verbal Abuse: Denies Sexual Abuse: Denies Exploitation of patient/patient's resources: Denies Self-Neglect: Denies Values / Beliefs Cultural Requests During Hospitalization: None Spiritual Requests During Hospitalization: None   Advance Directives (For Healthcare) Advance Directive: Patient does not have advance directive Nutrition Screen- [facility] Patient's home diet: Regular  Additional Information 1:1 In Past 12 Months?: No CIRT Risk: No Elopement Risk: No Does patient have medical clearance?: Yes     Disposition:  Disposition Initial Assessment Completed: Yes Disposition of Patient: Other dispositions (Disposition  pending telepsych or consult with pyshiatry)  On Site Evaluation by:   Reviewed with Physician:  Melynda Ripple [facility] 09/02/2012 12:03 PM

## 2012-09-02 NOTE — ED Notes (Addendum)
Dr jonnalagadda into see 

## 2012-09-02 NOTE — ED Notes (Signed)
Pt changed into blue scrubs.  Wanded by security.  Three bags of belongings, clothes, wanded by security.  [employer] at bedside.

## 2012-09-02 NOTE — ED Provider Notes (Signed)
History     CSN: [id number]  Arrival date & time 09/02/12  0137   First MD Initiated Contact with Patient 09/02/12 0151      Chief Complaint  Patient presents with  . Medical Clearance    (Consider location/radiation/quality/duration/timing/severity/associated sxs/prior treatment) HPI 22 year old male presents to emergency department via [employer] with need for psychiatric evaluation. Patient has history of schizophrenia and bipolar. Per [employer], patient has been aggressive and violent home. Patient reports he is having auditory hallucinations. He reports the voices are telling him to do "bad stuff". He reports they're telling him to hurt himself. Patient is clearly responding to internal stimuli, and has a difficult time answering my questions. Patient is threatening at times. He denies missing any doses of his medications.  Past Medical History  Diagnosis Date  . ADHD (attention deficit hyperactivity disorder)   . Hypertension   . Schizophrenia   . Bipolar 1 disorder     History reviewed. No pertinent past surgical history.  History reviewed. No pertinent family history.  History  Substance Use Topics  . Smoking status: Current Every Day Smoker    Types: Cigarettes  . Smokeless tobacco: Not on file  . Alcohol Use: No     Comment: Pt denies      Review of Systems  Unable to perform ROS: Psychiatric disorder    Allergies  Review of patient's allergies indicates no known allergies.  Home Medications   Current Outpatient Rx  Name  Route  Sig  Dispense  Refill  . ARIPiprazole (ABILIFY) 5 MG tablet   Oral   Take 5 mg by mouth daily.         Marland Kitchen aspirin 325 MG tablet   Oral   Take 325 mg by mouth daily.         Marland Kitchen EXPIRED: diphenhydrAMINE (BENADRYL) 25 MG tablet   Oral   Take 1 tablet (25 mg total) by mouth every 8 (eight) hours as needed for itching.   20 tablet   0   . traZODone (DESYREL) 150 MG tablet   Oral   Take 150 mg by mouth at bedtime.            BP 160/87  Pulse 69  Temp(Src) 97.9 F (36.6 C) (Oral)  Resp 19  SpO2 100%  Physical Exam  Nursing note and vitals reviewed. HENT:  Head: Normocephalic and atraumatic.  Nose: Nose normal.  Mouth/Throat: Oropharynx is clear and moist.  Eyes: Conjunctivae and EOM are normal. Pupils are equal, round, and reactive to light.  Neck: Normal range of motion. Neck supple. No JVD present. No tracheal deviation present. No thyromegaly present.  Cardiovascular: Normal rate, regular rhythm, normal heart sounds and intact distal pulses.  Exam reveals no gallop and no friction rub.   No murmur heard. Pulmonary/Chest: Effort normal and breath sounds normal. No stridor. No respiratory distress. He has no wheezes. He has no rales. He exhibits no tenderness.  Abdominal: Soft. Bowel sounds are normal. He exhibits no distension and no mass. There is no tenderness. There is no rebound and no guarding.  Musculoskeletal: Normal range of motion. He exhibits no edema and no tenderness.  Lymphadenopathy:    He has no cervical adenopathy.  Neurological: He is alert. He displays normal reflexes. He exhibits normal muscle tone. Coordination normal.  Skin: Skin is warm and dry. No rash noted. No erythema. No pallor.  Psychiatric:  Patient responding to internal stimuli, with auditory hallucinations. He appears to be acutely psychotic.  He is deemed to be a threat to himself and others    ED Course  Procedures (including critical care time)  Labs Reviewed  SALICYLATE LEVEL - Abnormal; Notable for the following:    Salicylate Lvl <2.0 (*)    All other components within normal limits  URINE RAPID DRUG SCREEN (HOSP PERFORMED) - Abnormal; Notable for the following:    Tetrahydrocannabinol POSITIVE (*)    All other components within normal limits  ACETAMINOPHEN LEVEL  CBC  COMPREHENSIVE METABOLIC PANEL  ETHANOL   No results found.   1. Auditory hallucination   2. Psychosis       MDM  22 year old  male with psychosis. Patient with auditory hallucinations, acutely psychotic. He has received Geodon and Ativan to help control his symptoms. He is to be placed in psychiatric unit. He will need admission to psychiatric facility. Discuss with Aurther Loft from [facility] team for help with placement.        Olivia Mackie, MD 09/02/12 [phone]

## 2012-09-02 NOTE — ED Notes (Signed)
Patient given belongings from locker. Patient denies SI/HI, denies AVH. Patient states "my medicine is working and thanks." Patient transported home by  Family.

## 2012-09-02 NOTE — Consult Note (Signed)
Reason for Consult: Auditory hallucinations and history of aggressive behavior at home Referring Physician: Dr. Henriette Combs Brandon [name] is an 22 y.o. male.  HPI: Patient was seen and chart reviewed. Patient reported he has been suffering with the psychosis, especially  Auditory hallucinations since he ran out of his medication 2 days ago. Patient requested medication until followup appointment with his primary provider at [facility] behavior health. Patient denies suicidal or homicidal ideations patient has received a prescription for his home medications at the time of discharge. Patient is following up with outpatient psychiatric services. Patient denies suicidal ideation, intention or plans. He has no evidence of psychotic symptoms. Patient using that screen was positive for tetrahydrocannabinol.  MSE: Patient was overweight calm and quite cooperative. Patient has fine mood with appropriate affect is normal rate rhythm and volume of speech his thought process linear and goal-directed he denies recent onset ideation has no evidence of psychotic symptoms.  Past Medical History  Diagnosis Date  . ADHD (attention deficit hyperactivity disorder)   . Hypertension   . Schizophrenia   . Bipolar 1 disorder     History reviewed. No pertinent past surgical history.  History reviewed. No pertinent family history.  Social History:  reports that he has been smoking Cigarettes.  He has been smoking about 0.00 packs per day. He does not have any smokeless tobacco history on file. He reports that he does not drink alcohol or use illicit drugs.  Allergies: No Known Allergies  Medications: I have reviewed the patient's current medications.  Results for orders placed during the hospital encounter of 09/02/12 (from the past 48 hour(s))  URINE RAPID DRUG SCREEN (HOSP PERFORMED)     Status: Abnormal   Collection Time    09/02/12  2:04 AM      Result Value Range   Opiates NONE DETECTED  NONE DETECTED    Cocaine NONE DETECTED  NONE DETECTED   Benzodiazepines NONE DETECTED  NONE DETECTED   Amphetamines NONE DETECTED  NONE DETECTED   Tetrahydrocannabinol POSITIVE (*) NONE DETECTED   Barbiturates NONE DETECTED  NONE DETECTED   Comment:            DRUG SCREEN FOR MEDICAL PURPOSES     ONLY.  IF CONFIRMATION IS NEEDED     FOR ANY PURPOSE, NOTIFY LAB     WITHIN 5 DAYS.                LOWEST DETECTABLE LIMITS     FOR URINE DRUG SCREEN     Drug Class       Cutoff (ng/mL)     Amphetamine      1000     Barbiturate      200     Benzodiazepine   200     Tricyclics       300     Opiates          300     Cocaine          300     THC              50  ACETAMINOPHEN LEVEL     Status: None   Collection Time    09/02/12  2:08 AM      Result Value Range   Acetaminophen (Tylenol), Serum <15.0  10 - 30 ug/mL   Comment:            THERAPEUTIC CONCENTRATIONS VARY     SIGNIFICANTLY. A RANGE OF  10-30     ug/mL MAY BE AN EFFECTIVE     CONCENTRATION FOR MANY PATIENTS.     HOWEVER, SOME ARE BEST TREATED     AT CONCENTRATIONS OUTSIDE THIS     RANGE.     ACETAMINOPHEN CONCENTRATIONS     >150 ug/mL AT 4 HOURS AFTER     INGESTION AND >50 ug/mL AT 12     HOURS AFTER INGESTION ARE     OFTEN ASSOCIATED WITH TOXIC     REACTIONS.  CBC     Status: None   Collection Time    09/02/12  2:08 AM      Result Value Range   WBC 9.5  4.0 - 10.5 K/uL   RBC 5.27  4.22 - 5.81 MIL/uL   Hemoglobin 13.9  13.0 - 17.0 g/dL   HCT 14.7  82.9 - 56.2 %   MCV 80.3  78.0 - 100.0 fL   MCH 26.4  26.0 - 34.0 pg   MCHC 32.9  30.0 - 36.0 g/dL   RDW 13.0  86.5 - 78.4 %   Platelets 192  150 - 400 K/uL  COMPREHENSIVE METABOLIC PANEL     Status: None   Collection Time    09/02/12  2:08 AM      Result Value Range   Sodium 139  135 - 145 mEq/L   Potassium 3.7  3.5 - 5.1 mEq/L   Chloride 105  96 - 112 mEq/L   CO2 25  19 - 32 mEq/L   Glucose, Bld 91  70 - 99 mg/dL   BUN 13  6 - 23 mg/dL   Creatinine, Ser 6.96  0.50 - 1.35 mg/dL    Calcium 9.0  8.4 - 29.5 mg/dL   Total Protein 7.1  6.0 - 8.3 g/dL   Albumin 3.7  3.5 - 5.2 g/dL   AST 16  0 - 37 U/L   ALT 27  0 - 53 U/L   Alkaline Phosphatase 82  39 - 117 U/L   Total Bilirubin 0.3  0.3 - 1.2 mg/dL   GFR calc non Af Amer >90  >90 mL/min   GFR calc Af Amer >90  >90 mL/min   Comment:            The eGFR has been calculated     using the CKD EPI equation.     This calculation has not been     validated in all clinical     situations.     eGFR's persistently     <90 mL/min signify     possible Chronic Kidney Disease.  ETHANOL     Status: None   Collection Time    09/02/12  2:08 AM      Result Value Range   Alcohol, Ethyl (B) <11  0 - 11 mg/dL   Comment:            LOWEST DETECTABLE LIMIT FOR     SERUM ALCOHOL IS 11 mg/dL     FOR MEDICAL PURPOSES ONLY  SALICYLATE LEVEL     Status: Abnormal   Collection Time    09/02/12  2:08 AM      Result Value Range   Salicylate Lvl <2.0 (*) 2.8 - 20.0 mg/dL    No results found.  Positive for aggressive behavior, bad mood, behavior problems, illegal drug usage, mood swings and sleep disturbance Blood pressure 140/88, pulse 76, temperature 97.6 F (36.4 C), temperature source Oral, resp. rate 18, SpO2 100.00%.  Assessment/Plan: Cannabis abuse  Psychosis not otherwise specified Rule out schizoaffective disorder  Recommendation: Patient does not meet criteria for acute psychiatric hospitalization. Patient will be referred with outpatient psychiatry services with a 15 day supply of home medications Abilify and trazodone.  Hopelynn Gartland,JANARDHAHA R. 09/02/2012, 5:24 PM

## 2012-09-02 NOTE — ED Provider Notes (Signed)
  08:15- he is too sleepy to awaken. He received high-dose Geodon, and Ativan 2:30 AM.  16:10- he has been able to talk to a ACT, but was evasive and inappropriate. At this point, we are waiting for a psychiatrist to see the patient; to help with diagnosis, medication adjustment and suggest a disposition.    Brandon Melter, MD 09/02/12 [phone]

## 2012-09-02 NOTE — [facility] Suicide Risk Assessment (Signed)
Suicide Risk Assessment  Discharge Assessment     Demographic Factors:  Male, Adolescent or young adult and Low socioeconomic status  Mental Status Per Nursing Assessment::   On Admission:     Current Mental Status by Physician: NA  Loss Factors: Financial problems/change in socioeconomic status  Historical Factors: NA  Risk Reduction Factors:   Sense of responsibility to family, Religious beliefs about death, Living with another person, especially a relative, Positive social support and Positive therapeutic relationship  Continued Clinical Symptoms:  Schizophrenia:   Paranoid or undifferentiated type Previous Psychiatric Diagnoses and Treatments Medical Diagnoses and Treatments/Surgeries  Cognitive Features That Contribute To Risk:  Polarized thinking    Suicide Risk:  Minimal: No identifiable suicidal ideation.  Patients presenting with no risk factors but with morbid ruminations; may be classified as minimal risk based on the severity of the depressive symptoms  Discharge Diagnoses:   AXIS I:  Schizoaffective Disorder AXIS II:  Deferred AXIS III:   Past Medical History  Diagnosis Date  . ADHD (attention deficit hyperactivity disorder)   . Hypertension   . Schizophrenia   . Bipolar 1 disorder    AXIS IV:  economic problems, educational problems, other psychosocial or environmental problems and problems related to social environment AXIS V:  41-50 serious symptoms  Plan Of Care/Follow-up recommendations:  Activity:  as tolerated Diet:  regular  Is patient on multiple antipsychotic therapies at discharge:  No   Has Patient had three or more failed trials of antipsychotic monotherapy by history:  No  Recommended Plan for Multiple Antipsychotic Therapies: Not applicable  Brandon [name],JANARDHAHA R. 09/02/2012, 5:36 PM

## 2012-09-06 ENCOUNTER — Encounter (HOSPITAL_COMMUNITY): Payer: Self-pay | Admitting: Emergency Medicine

## 2012-09-06 ENCOUNTER — Emergency Department (HOSPITAL_COMMUNITY)
Admission: EM | Admit: 2012-09-06 | Discharge: 2012-09-06 | Disposition: A | Discharge status: Home / Self Care | Payer: Medicaid Other | Attending: Emergency Medicine | Admitting: Emergency Medicine

## 2012-09-06 DIAGNOSIS — I10 Essential (primary) hypertension: Secondary | ICD-10-CM | POA: Insufficient documentation

## 2012-09-06 DIAGNOSIS — F319 Bipolar disorder, unspecified: Secondary | ICD-10-CM | POA: Insufficient documentation

## 2012-09-06 DIAGNOSIS — F17200 Nicotine dependence, unspecified, uncomplicated: Secondary | ICD-10-CM | POA: Insufficient documentation

## 2012-09-06 DIAGNOSIS — F259 Schizoaffective disorder, unspecified: Secondary | ICD-10-CM | POA: Insufficient documentation

## 2012-09-06 DIAGNOSIS — Z79899 Other long term (current) drug therapy: Secondary | ICD-10-CM | POA: Insufficient documentation

## 2012-09-06 DIAGNOSIS — Z8659 Personal history of other mental and behavioral disorders: Secondary | ICD-10-CM | POA: Insufficient documentation

## 2012-09-06 NOTE — ED Provider Notes (Signed)
History     CSN: [id number]  Arrival date & time 09/06/12  0052   First MD Initiated Contact with Patient 09/06/12 0056      Chief Complaint  Patient presents with  . Hallucinations    (Consider location/radiation/quality/duration/timing/severity/associated sxs/prior treatment) HPI.... patient with known psychiatric problems presents with auditory hallucinations. He was recently evaluated by her behavioral health team and sent home. He has been hitting himself in the head but not tonight.  No suicidal or homicidal ideation. He has behavioral health followup. Nothing makes symptoms better or worse. Severity is mild.  Past Medical History  Diagnosis Date  . ADHD (attention deficit hyperactivity disorder)   . Hypertension   . Schizophrenia   . Bipolar 1 disorder     History reviewed. No pertinent past surgical history.  No family history on file.  History  Substance Use Topics  . Smoking status: Current Every Day Smoker    Types: Cigarettes  . Smokeless tobacco: Not on file  . Alcohol Use: No     Comment: Pt denies      Review of Systems  All other systems reviewed and are negative.    Allergies  Review of patient's allergies indicates no known allergies.  Home Medications   Current Outpatient Rx  Name  Route  Sig  Dispense  Refill  . ARIPiprazole (ABILIFY) 5 MG tablet   Oral   Take 1 tablet (5 mg total) by mouth daily.   15 tablet   0   . doxepin (SINEQUAN) 10 MG capsule   Oral   Take 10 mg by mouth at bedtime.         . traZODone (DESYREL) 150 MG tablet   Oral   Take 1 tablet (150 mg total) by mouth at bedtime.   15 tablet   0     BP 136/76  Pulse 62  Temp(Src) 98 F (36.7 C) (Oral)  SpO2 100%  Physical Exam  Nursing note and vitals reviewed. Constitutional: He is oriented to person, place, and time. He appears well-developed and well-nourished.  HENT:  Head: Normocephalic and atraumatic.  Eyes: Conjunctivae and EOM are normal. Pupils  are equal, round, and reactive to light.  Neck: Normal range of motion. Neck supple.  Cardiovascular: Normal rate, regular rhythm and normal heart sounds.   Pulmonary/Chest: Effort normal and breath sounds normal.  Abdominal: Soft. Bowel sounds are normal.  Musculoskeletal: Normal range of motion.  Neurological: He is alert and oriented to person, place, and time.  Skin: Skin is warm and dry.  Psychiatric:  Flat affect, does not appear psychotic    ED Course  Procedures (including critical care time)  Labs Reviewed - No data to display No results found.   1. Schizoaffective disorder       MDM  Behavioral health consult obtained. Patient can do outpatient therapy. no suicidal or homicidal        Donnetta Hutching, MD 09/06/12 [phone]

## 2012-09-06 NOTE — BH Assessment (Signed)
Assessment Note   Brandon [name] is an 22 y.o. male. Pt presents voluntarily to [facility] via EMS. He says that he called EMS b/c he is tired of hearing voices. Pt displaying thought blocking. He endorses AH but denies "the voices" tell him to harm himself. He says voices tell him he is fat. Per chart review, Pt was d/c from [facility] 09/02/12 on same day w/ 15 day supply of Abilify and Trazodone. Pt reports he is taking meds as prescribed.  Pt denies substance use. Pt has hx of schizophrenia and bipolar d/o. Pt describes mood as "so so". Affect is blunted. Pt is cooperative and pleasant. Pt denies SI and HI. Pt appears to be responding to internal stimuli and asks writer to repeat each question. He denies hearing difficulties. Pt lives with his parents. Patient doesn't meet inpatient criteria. He will be d/c and encouraged to keep his upcoming appointment at [facility].  Axis I: Schizophrenia, Paranoid Type Axis II: Deferred Axis III:  Past Medical History  Diagnosis Date  . ADHD (attention deficit hyperactivity disorder)   . Hypertension   . Schizophrenia   . Bipolar 1 disorder    Axis IV: other psychosocial or environmental problems and problems related to social environment Axis V: 51-60 moderate symptoms  Past Medical History:  Past Medical History  Diagnosis Date  . ADHD (attention deficit hyperactivity disorder)   . Hypertension   . Schizophrenia   . Bipolar 1 disorder     History reviewed. No pertinent past surgical history.  Family History: No family history on file.  Social History:  reports that he has been smoking Cigarettes.  He has been smoking about 0.00 packs per day. He does not have any smokeless tobacco history on file. He reports that he does not drink alcohol or use illicit drugs.  Additional Social History:  Alcohol / Drug Use Pain Medications: none - pt denies abuse Prescriptions: pt states compliant w/ meds Over the Counter: none History of alcohol / drug use?:  (per  chart review, pt has hx of thc use but pt denies use)  CIWA: CIWA-Ar BP: 136/76 mmHg Pulse Rate: 62 COWS:    Allergies: No Known Allergies  Home Medications:  (Not in a hospital admission)  OB/GYN Status:  No LMP for male patient.  General Assessment Data Location of Assessment: [facility] Living Arrangements: Parent Can pt return to current living arrangement?: Yes Admission Status: Voluntary Is patient capable of signing voluntary admission?: Yes Transfer from: Home Referral Source: Self/Family/Friend  Education Status Is patient currently in school?: No Current Grade: na Highest grade of school patient has completed: 12 Name of school: [employer] in [place]  Risk to self Suicidal Ideation: No Suicidal Intent: No Is patient at risk for suicide?: No Suicidal Plan?: No Access to Means: No Specify Access to Suicidal Means: none What has been your use of drugs/alcohol within the last 12 months?: pt denies use - no labs taken Previous Attempts/Gestures: No How many times?: 0 Other Self Harm Risks: none Triggers for Past Attempts:  (none) Intentional Self Injurious Behavior: None Family Suicide History: No Recent stressful life event(s): Other (Comment) (pt denies stressors) Persecutory voices/beliefs?: Yes Depression: No Substance abuse history and/or treatment for substance abuse?: No Suicide prevention information given to non-admitted patients: Not applicable  Risk to Others Homicidal Ideation: No Thoughts of Harm to Others: No Current Homicidal Intent: No Current Homicidal Plan: No Access to Homicidal Means: No Identified Victim: none History of harm to others?:  No Assessment of Violence: None Noted Violent Behavior Description: pt calm and cooperative Does patient have access to weapons?: No Criminal Charges Pending?: Yes Describe Pending Criminal Charges: breaking & entering, posession of stolen goods Does patient have a court date: Yes Court Date:  09/17/12  Psychosis Hallucinations: Auditory Delusions: None noted  Mental Status Report Appear/Hygiene: Other (Comment);Body odor (appropriate clothing) Eye Contact: Good Motor Activity: Freedom of movement Speech: Soft;Slow Level of Consciousness: Quiet/awake Mood: Other (Comment) (euthymic) Affect: Blunted Anxiety Level: None Thought Processes: Coherent;Relevant Judgement: Impaired Orientation: Person;Place Obsessive Compulsive Thoughts/Behaviors: None  Cognitive Functioning Concentration: Normal Memory: Recent Intact;Remote Intact IQ: Average Insight: Poor Impulse Control: Poor Appetite: Fair Weight Loss: 0 Weight Gain: 0 Sleep: No Change Total Hours of Sleep:  (pt unable to give amount of hrs slept) Vegetative Symptoms: None  ADLScreening [facility]) Patient's cognitive ability adequate to safely complete daily activities?: Yes Patient able to express need for assistance with ADLs?: Yes Independently performs ADLs?: Yes (appropriate for developmental age)  Abuse/Neglect [facility]) Physical Abuse: Denies Verbal Abuse: Denies Sexual Abuse: Denies  Prior Inpatient Therapy Prior Inpatient Therapy: Yes Prior Therapy Dates: unsure of dates Prior Therapy Facilty/Provider(s): [facility] Reason for Treatment: psychosis  Prior Outpatient Therapy Prior Outpatient Therapy: Yes Prior Therapy Dates: currently Prior Therapy Facilty/Provider(s): [facility] Reason for Treatment: Med management  ADL Screening (condition at time of admission) Patient's cognitive ability adequate to safely complete daily activities?: Yes Patient able to express need for assistance with ADLs?: Yes Independently performs ADLs?: Yes (appropriate for developmental age) Weakness of Legs: None Weakness of Arms/Hands: None       Abuse/Neglect Assessment (Assessment to be complete while patient is alone) Physical Abuse: Denies Verbal Abuse: Denies Sexual Abuse:  Denies Exploitation of patient/patient's resources: Denies Self-Neglect: Denies Values / Beliefs Cultural Requests During Hospitalization: None Spiritual Requests During Hospitalization: None   Advance Directives (For Healthcare) Advance Directive: Patient does not have advance directive;Patient would not like information    Additional Information 1:1 In Past 12 Months?: No CIRT Risk: No Elopement Risk: No Does patient have medical clearance?: Yes     Disposition:  Disposition Initial Assessment Completed: Yes Disposition of Patient: Other dispositions Other disposition(s): To current provider Vesta Mixer)  On Site Evaluation by:   Reviewed with Physician:     Donnamarie Rossetti P 09/06/2012 3:13 AM

## 2012-09-06 NOTE — ED Notes (Signed)
Bed:WA17<BR> Expected date:<BR> Expected time:<BR> Means of arrival:<BR> Comments:<BR> EMS

## 2012-09-06 NOTE — ED Notes (Signed)
Per EMS, pt has been repeated been hitting himself in the head and been hearing auditory hallucinations. Pt was seen here at [facility] 09/02/12 and was IVC. Pt stated the voices are telling him he is fat. Denies SI/HI.

## 2012-09-27 ENCOUNTER — Encounter (HOSPITAL_COMMUNITY): Payer: Self-pay

## 2012-09-27 ENCOUNTER — Emergency Department (HOSPITAL_COMMUNITY)
Admission: EM | Admit: 2012-09-27 | Discharge: 2012-09-27 | Disposition: A | Discharge status: Home / Self Care | Payer: Medicaid Other | Attending: Emergency Medicine | Admitting: Emergency Medicine

## 2012-09-27 DIAGNOSIS — F319 Bipolar disorder, unspecified: Secondary | ICD-10-CM | POA: Insufficient documentation

## 2012-09-27 DIAGNOSIS — F209 Schizophrenia, unspecified: Secondary | ICD-10-CM | POA: Insufficient documentation

## 2012-09-27 DIAGNOSIS — J3489 Other specified disorders of nose and nasal sinuses: Secondary | ICD-10-CM | POA: Insufficient documentation

## 2012-09-27 DIAGNOSIS — Z79899 Other long term (current) drug therapy: Secondary | ICD-10-CM | POA: Insufficient documentation

## 2012-09-27 DIAGNOSIS — I10 Essential (primary) hypertension: Secondary | ICD-10-CM | POA: Insufficient documentation

## 2012-09-27 DIAGNOSIS — K5900 Constipation, unspecified: Secondary | ICD-10-CM | POA: Insufficient documentation

## 2012-09-27 DIAGNOSIS — F909 Attention-deficit hyperactivity disorder, unspecified type: Secondary | ICD-10-CM | POA: Insufficient documentation

## 2012-09-27 MED ORDER — POLYETHYLENE GLYCOL 3350 17 GM/SCOOP PO POWD: 17.0000 g | Freq: Every day | ORAL | Status: DC

## 2012-09-27 NOTE — ED Notes (Addendum)
Patient states his last BM was yesterday and normal. Patient denies taking any laxatives or stool softeners. Patient states he was having hallucinations while on the ambulance, Patient would not elaborate. Patient also states that he hears voices and the voices ar telling him that he is crazy. Patient states that he is not here for psychiatric reasons.

## 2012-09-27 NOTE — ED Notes (Signed)
Per EMS- patient c/o constipation and when questioned, patient had inappropriate laughter. Patient hallucinating while en route to the ED.

## 2012-09-27 NOTE — ED Provider Notes (Signed)
History     CSN: [id number]  Arrival date & time 09/27/12  1753   First MD Initiated Contact with Patient 09/27/12 2010      Chief Complaint  Patient presents with  . Constipation   HPI  History provided by the patient. Patient is 22 year old male with known psychiatric disorders of schizophrenia and bipolar disorder who presents with complaints of constipation. Patient states that he has felt constipated since yesterday. He does report taking any over-the-counter liquid laxative but is unsure of the name. He states this may have helped this he has since had 2 bowel movements today and feels improved. He states stool was hard but denies any rectal pain with bowel movement. Denies any rectal bleeding. He denies any abdominal pains. No nausea or vomiting. Normal appetite during the day. Patient denies any other complaints to me. He denies feeling increased depression and he SI or HI. He currently lives with his mother and grandmother and feels safe at home. Symptoms are mild. No other aggravating or alleviating factors. No other associated symptoms.    Past Medical History  Diagnosis Date  . ADHD (attention deficit hyperactivity disorder)   . Hypertension   . Schizophrenia   . Bipolar 1 disorder     History reviewed. No pertinent past surgical history.  Family History  Problem Relation Age of Onset  . Diabetes Mother     History  Substance Use Topics  . Smoking status: Current Every Day Smoker -- 0.25 packs/day    Types: Cigarettes  . Smokeless tobacco: Never Used  . Alcohol Use: No     Comment: Pt denies      Review of Systems  Constitutional: Negative for fever, chills and appetite change.  HENT: Positive for rhinorrhea. Negative for congestion.   Respiratory: Negative for cough.   Cardiovascular: Negative for chest pain.  Gastrointestinal: Positive for constipation. Negative for nausea, vomiting, abdominal pain, diarrhea and blood in stool.  Genitourinary: Negative  for dysuria and flank pain.  All other systems reviewed and are negative.    Allergies  Review of patient's allergies indicates no known allergies.  Home Medications   Current Outpatient Rx  Name  Route  Sig  Dispense  Refill  . ARIPiprazole (ABILIFY) 9.75 MG/1.3ML injection   Intramuscular   Inject 9.75 mg into the muscle every 30 (thirty) days.         Marland Kitchen doxepin (SINEQUAN) 10 MG capsule   Oral   Take 10 mg by mouth at bedtime.         . traZODone (DESYREL) 150 MG tablet   Oral   Take 1 tablet (150 mg total) by mouth at bedtime.   15 tablet   0   . polyethylene glycol powder (GLYCOLAX/MIRALAX) powder   Oral   Take 17 g by mouth daily.   255 g   0     BP 116/90  Pulse 69  Temp(Src) 97.6 F (36.4 C) (Oral)  Resp 16  SpO2 100%  Physical Exam  Nursing note and vitals reviewed. Constitutional: He is oriented to person, place, and time. He appears well-developed and well-nourished. No distress.  HENT:  Head: Normocephalic and atraumatic.  Cardiovascular: Normal rate and regular rhythm.   Pulmonary/Chest: Effort normal and breath sounds normal. No respiratory distress.  Abdominal: Soft. Bowel sounds are normal. He exhibits no distension. There is no tenderness. There is no rebound and no guarding.  Musculoskeletal: Normal range of motion.  Neurological: He is alert and oriented to  person, place, and time.  Skin: Skin is warm.  Psychiatric: His behavior is normal. His mood appears not anxious. He expresses no homicidal and no suicidal ideation.    ED Course  Procedures      1. Constipation       MDM  8:15 PM patient seen and evaluated. Patient sitting calmly and comfortably in the bed in no apparent distress. Abdomen is soft with no pain and no peritoneal signs.  Patient only presenting complaint is constipation however he reports a bowel movement several hours ago with improvement of symptoms. Denies any other complaints. Denies any depression.  Denies any SI or HI. Patient lives with family and states he feels safe at home. At this time we'll discharge with recommendations for stool softener.        Angus Seller, PA-C 09/27/12 2042

## 2012-09-28 NOTE — ED Provider Notes (Signed)
Medical screening examination/treatment/procedure(s) were performed by non-physician practitioner and as supervising physician I was immediately available for consultation/collaboration.  Dlisa Barnwell T Doyt Castellana, MD 09/28/12 1544 

## 2012-11-23 ENCOUNTER — Encounter: Payer: Self-pay | Admitting: Neurology

## 2012-11-23 ENCOUNTER — Ambulatory Visit (INDEPENDENT_AMBULATORY_CARE_PROVIDER_SITE_OTHER): Payer: Medicaid Other | Admitting: Neurology

## 2012-11-23 VITALS — BP 125/76 | HR 76 | Ht 71.5 in | Wt 322.0 lb

## 2012-11-23 DIAGNOSIS — R269 Unspecified abnormalities of gait and mobility: Secondary | ICD-10-CM

## 2012-11-23 DIAGNOSIS — R930 Abnormal findings on diagnostic imaging of skull and head, not elsewhere classified: Secondary | ICD-10-CM

## 2012-11-23 DIAGNOSIS — R9082 White matter disease, unspecified: Secondary | ICD-10-CM | POA: Insufficient documentation

## 2012-11-23 DIAGNOSIS — R2681 Unsteadiness on feet: Secondary | ICD-10-CM | POA: Insufficient documentation

## 2012-11-23 NOTE — Progress Notes (Signed)
Reason for visit: Abnormal MRI brain  Brandon [name] is a 22 y.o. male  History of present illness:  Brandon [name] is a 22 year old right-handed black male with a history of schizoaffective disorder. The patient has had episodes in the past of alteration in his ability to ambulate. The patient was in the hospital in 2010, and he was noted to have an abnormal MRI the brain at that time. The patient has recently been in a psychiatric hospital earlier this year in April 2014 in [place], [place]. The patient once again underwent MRI evaluation of the brain showing multiple white matter abnormalities involving the supra-and infratentorial aspects of the brain. This is felt potentially to be consistent with multiple sclerosis. MRA of the head was otherwise normal. The patient has had an alteration in walking once again. The patient has had some problems with headaches, and he denies any numbness of the extremities. The patient continues to have balance issues, but he does not use a cane. The patient has had enuresis. The patient reports some blurring of vision. The patient is sent to this office for further evaluation of the MRI abnormalities.  Past Medical History  Diagnosis Date  . ADHD (attention deficit hyperactivity disorder)   . Hypertension   . Schizophrenia   . Bipolar 1 disorder   . 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 Obesity     History reviewed. No pertinent past surgical history.  Family History  Problem Relation Age of Onset  . Diabetes Mother   . ADD / ADHD Brother     Social history:  reports that he has been smoking Cigarettes.  He has been smoking about 0.25 packs per day. He has never used smokeless tobacco. He reports that he uses illicit drugs (Marijuana). He reports that he does not drink alcohol.  Medications:  Current Outpatient Prescriptions on File Prior to Visit  Medication Sig Dispense Refill  . ARIPiprazole (ABILIFY) 9.75 MG/1.3ML injection Inject  9.75 mg into the muscle every 30 (thirty) days.       No current facility-administered medications on file prior to visit.    Allergies: No Known Allergies  ROS:  Out of a complete 14 system review of symptoms, the patient complains only of the following symptoms, and all other reviewed systems are negative.  Weight gain Blurred vision Snoring Increased thirst Memory loss, confusion, headache, slurred speech Depression, anxiety, insomnia, hallucinations  Blood pressure 125/76, pulse 76, height 5' 11.5" (1.816 m), weight 322 lb (146.058 kg).  Physical Exam  General: The patient is alert and cooperative at the time of the examination. The patient has a flat affect, markedly obese.  Head: Pupils are equal, round, and reactive to light. Discs are flat bilaterally.  Neck: The neck is supple, no carotid bruits are noted.  Respiratory: The respiratory examination is clear.  Cardiovascular: The cardiovascular examination reveals a regular rate and rhythm, no obvious murmurs or rubs are noted.  Skin: Extremities are without significant edema.  Neurologic Exam  Mental status:  Cranial nerves: Facial symmetry is present. There is good sensation of the face to pinprick and soft touch bilaterally. The strength of the facial muscles and the muscles to head turning and shoulder shrug are normal bilaterally. Speech is dysarthric. Extraocular movements are full. Visual fields are full.  Motor: The motor testing reveals 5 over 5 strength of all 4 extremities. Good symmetric motor tone is noted throughout.  Sensory: Sensory testing is intact to pinprick, soft  touch, vibration sensation, and position sense on all 4 extremities, with the exception that position sense is slightly decreased in the feet.. No evidence of extinction is noted.  Coordination: Cerebellar testing reveals good finger-nose-finger and heel-to-shin bilaterally.  Gait and station: Gait is normal. Tandem gait is slightly  unsteady. Romberg is negative. No drift is seen.  Reflexes: Deep tendon reflexes are symmetric, but are slightly depressed bilaterally. Toes are downgoing bilaterally.   Assessment/Plan:  1. Abnormal MRI the brain, possible multiple sclerosis  2. Schizoaffective disorder  3. Hypertension  4. Obesity  The patient has developed abnormalities of the brain that may represent multiple sclerosis. The patient will be set up for MRI evaluation of the cervical spine and brain. If spinal cord lesions are noted, the possibility of multiple sclerosis is much greater. The patient will be sent for blood work looking for other etiologies of brain abnormalities such as hypercoagulable states or vasculitis. An HIV panel has already been sent, and this is negative. The patient followup in 3 months. A lumbar puncture may be required in the future depending upon the results of the above.  Brandon Palau MD 11/23/2012 1:34 PM  [facility] [address] [place], [place] [address]  Phone [phone] Fax [phone]

## 2012-11-23 NOTE — Addendum Note (Signed)
Addended by: Stephanie Acre on: 11/23/2012 02:11 PM   Modules accepted: Orders

## 2012-11-25 LAB — PAN-ANCA
ANCA Proteinase 3: <3.5 U/mL (ref 0.0–3.5)
Atypical pANCA: <1:20 {titer}
C-ANCA: <1:20 {titer}
Myeloperoxidase Ab: <9 U/mL (ref 0.0–9.0)
P-ANCA: <1:20 {titer}

## 2012-11-25 LAB — RPR: RPR: NONREACTIVE

## 2012-11-26 LAB — CARDIOLIPIN ANTIBODY
Anticardiolipin IgA: <9 APL U/mL (ref 0–11)
Anticardiolipin IgG: <9 GPL U/mL (ref 0–14)
Anticardiolipin IgM: <9 MPL U/mL (ref 0–12)

## 2012-11-26 LAB — LUPUS ANTICOAGULANT
Dilute Viper Venom Time: 45.3 s (ref 0.0–55.1)
PTT Lupus Anticoagulant: 35.1 s (ref 0.0–50.0)
Thrombin Time: 17.3 s (ref 0.0–20.0)
dPT Confirm Ratio: 1.05 Ratio (ref 0.00–1.20)
dPT: 40.8 s (ref 0.0–55.0)

## 2012-11-26 LAB — PROTEIN S PANEL
Protein S Activity: 99 % (ref 60–145)
Protein S Ag, Free: 110 % (ref 56–124)
Protein S Ag, Total: 97 % (ref 58–150)

## 2012-11-26 LAB — ANGIOTENSIN CONVERTING ENZYME: Angio Convert Enzyme: 44 U/L (ref 14–82)

## 2012-11-26 LAB — FACTOR 5 LEIDEN

## 2012-11-26 LAB — PROTEIN C ACTIVITY: Protein C Activity: 119 % (ref 74–151)

## 2012-11-26 LAB — NMO IGG AUTOANTIBODIES: NMO-IgG: <1.6 U/mL (ref 0.0–3.0)

## 2012-11-26 LAB — LYME, TOTAL AB TEST/REFLEX: Lyme Ab: <0.91 index (ref 0.00–0.90)

## 2012-11-26 LAB — SEDIMENTATION RATE: Sed Rate: 14 mm/hr (ref 0–15)

## 2012-11-26 LAB — ANA W/REFLEX: Anti Nuclear Antibody(ANA): NEGATIVE

## 2012-11-26 LAB — RHEUMATOID FACTOR: Rhuematoid fact SerPl-aCnc: <7 IU/mL (ref 0.0–13.9)

## 2012-12-14 ENCOUNTER — Telehealth: Payer: Self-pay | Admitting: Neurology

## 2012-12-14 NOTE — Telephone Encounter (Signed)
[name] tells me Dr. Anne Hahn had given the patient some type of anti-anxiety medication before his MRI.  Mom tells me she dropped his medication on the bus and couldn't find it.  They are asking for more.  Please call Mom at [phone].

## 2012-12-15 ENCOUNTER — Telehealth: Payer: Self-pay | Admitting: Neurology

## 2012-12-15 NOTE — Telephone Encounter (Signed)
I called the mother I left a message. Okay for them to come by the office and get another pack of Xanax prior to the MRI.

## 2012-12-15 NOTE — Telephone Encounter (Signed)
Spoke to patient. He dropped his Xanax on the bus and is requesting more before MRI. Advised would inform Dr. Anne Hahn.

## 2012-12-16 NOTE — Telephone Encounter (Signed)
Dispensed Alprazolam 0.5mg  Tabs #3 Lot [id number] Exp 11/2013.

## 2012-12-17 ENCOUNTER — Telehealth: Payer: Self-pay | Admitting: Neurology

## 2012-12-17 ENCOUNTER — Inpatient Hospital Stay: Admission: RE | Admit: 2012-12-17 | Payer: Medicaid Other | Source: Ambulatory Visit

## 2012-12-17 ENCOUNTER — Other Ambulatory Visit: Payer: Medicaid Other

## 2012-12-17 NOTE — Telephone Encounter (Signed)
Per previous phone note:  York Spaniel, MD at 12/15/2012 5:39 PM   Status: Signed            I called the mother I left a message. Okay for them to come by the office and get another pack of Xanax prior to the MRI.  I called back.  Spoke with mom.  She claims she did not get a call from anyone here, and therefore cancelled the appt for MRI.  She said she always has her phone with her and she has it set up so she can hear the message when it's being left.  States at that time she will choose wether or not to pick up the phone and speak to whomever is calling.  She said she was told to call us to schedule another MRI.  She would like to get the Rx called in to the pharmacy rather than picking it up, but does not want it prescribed until the week of the rescheduled appt because she is afraid it may get misplaced.  Told her I will forward a message to [facility] to see if she can call her back regarding reschedule.

## 2012-12-17 NOTE — Telephone Encounter (Signed)
Patient's Mom is calling to tell us the sedative for her son was not confirmed for the pharmacy.  She told me she spoke with Weston Brass, at their preferred pharmacy and was told the med needs to be confirmed.  Mom's phone number is:  [phone].  MRI's were scheduled for today but had to be cancelled because the patient did not receive his sedative.

## 2013-01-10 ENCOUNTER — Telehealth: Payer: Self-pay | Admitting: Neurology

## 2013-01-10 MED ORDER — ALPRAZOLAM 0.5 MG PO TABS: ORAL_TABLET | ORAL | Status: DC

## 2013-01-10 NOTE — Telephone Encounter (Signed)
I will call in a prescription for Xanax. This was called into  the pharmacy

## 2013-01-10 NOTE — Telephone Encounter (Signed)
Mother would like Xanax called into the pharmacy rather than coming here to pick up meds.  Previous phone note says it's okay to give meds to patient.  Since they do not want to pick up packet here, will need a printed Rx to fax to the pharmacy.  Thank you.

## 2013-01-14 ENCOUNTER — Ambulatory Visit
Admission: RE | Admit: 2013-01-14 | Discharge: 2013-01-14 | Disposition: A | Discharge status: Home / Self Care | Payer: Medicaid Other | Source: Ambulatory Visit | Attending: Neurology | Admitting: Neurology

## 2013-01-14 ENCOUNTER — Telehealth: Payer: Self-pay | Admitting: *Deleted

## 2013-01-14 DIAGNOSIS — R269 Unspecified abnormalities of gait and mobility: Secondary | ICD-10-CM

## 2013-01-14 DIAGNOSIS — R9082 White matter disease, unspecified: Secondary | ICD-10-CM

## 2013-01-14 NOTE — Telephone Encounter (Signed)
Brandon [name] called from [facility], [phone], MRI tech, stating pt could not be still for MRI.  (felt like he was too medicated).  He would fall asleep and then wake up in scanner and move (forgetting about what was going on).  Having to be instructed multiple times.  Hard time being still for IV placement.  Mother with pt.   Did not able to get MRI done.

## 2013-01-14 NOTE — Telephone Encounter (Signed)
The patient has a schizoaffective disorder, and he may not be a will to cooperate well for the MRI. I will try to get this done in the hospital, we will give IV Ativan prior to the MRI.

## 2013-01-14 NOTE — Telephone Encounter (Signed)
Mother calling pt needing to go to [facility] hospital for imaging.  I called and LMVM that received message from [facility] about not being able to do scan today and her request for it to be done at hospital .

## 2013-01-25 ENCOUNTER — Telehealth: Payer: Self-pay | Admitting: Neurology

## 2013-01-25 NOTE — Telephone Encounter (Signed)
I called and spoke with patient's mother and she wants to know if the physician can use the MRI & MRA results from [facility] to start her son on some type of medication because he is fallen a lot.  [name] also stated that her son has had a lumbar puncture also at [facility].

## 2013-01-25 NOTE — Telephone Encounter (Signed)
I called the mother. The patient is unable to get MRI evaluation easily. I will go ahead and start Tecfidera, as the MRI the brain done previously was fairly abnormal. The blood work was unremarkable. Tecfidera is relatively safe. The patient has had a CBC and a conference of metabolic profile that were unremarkable within the last 4 months. The mother is to come in to sign the consent form.

## 2013-02-09 ENCOUNTER — Telehealth: Payer: Self-pay | Admitting: Neurology

## 2013-02-10 NOTE — Telephone Encounter (Signed)
Formes were refaxed with POA.

## 2013-03-02 ENCOUNTER — Telehealth: Payer: Self-pay | Admitting: Neurology

## 2013-03-04 NOTE — Telephone Encounter (Signed)
Forms were received and faxed back on 08/27.  I spoke with Brandon [name].  She is aware we are pending insurance response.

## 2013-05-19 ENCOUNTER — Telehealth: Payer: Self-pay | Admitting: Neurology

## 2013-05-19 NOTE — Telephone Encounter (Signed)
LM for patient to schedule VER per Dr. Anne Hahn.

## 2013-05-20 ENCOUNTER — Ambulatory Visit: Payer: Medicaid Other | Admitting: Neurology

## 2013-05-20 ENCOUNTER — Encounter: Payer: Self-pay | Admitting: Neurology

## 2013-05-20 ENCOUNTER — Ambulatory Visit (INDEPENDENT_AMBULATORY_CARE_PROVIDER_SITE_OTHER): Payer: Medicaid Other | Admitting: Neurology

## 2013-05-20 ENCOUNTER — Encounter (INDEPENDENT_AMBULATORY_CARE_PROVIDER_SITE_OTHER): Payer: Self-pay

## 2013-05-20 VITALS — BP 120/98 | HR 62 | Wt 356.0 lb

## 2013-05-20 DIAGNOSIS — R269 Unspecified abnormalities of gait and mobility: Secondary | ICD-10-CM

## 2013-05-20 DIAGNOSIS — G35 Multiple sclerosis: Secondary | ICD-10-CM

## 2013-05-20 HISTORY — DX: Multiple sclerosis: G35

## 2013-05-20 NOTE — Patient Instructions (Signed)
Multiple Sclerosis Multiple sclerosis (MS) is a disease of the central nervous system. Its cause is unknown. It is more common in the northern states than in the southern states. There is a higher incidence of MS in women. There is a wide variation in the symptoms (problems) of MS. This is because of the many different ways it affects the central nervous system. It often comes on in episodes or attacks. These attacks may last weeks to months. There may be long periods of nearly no problems between attacks. The main symptoms include visual problems (associated with eye pain), numbness, weakness, and paralysis in extremities (arms/hands and legs/feet). There may also be tremors and problems with balance and walking. The age when MS starts is variable. Advances in medicine continue to improve the treatment of this illness. There is no known cure for MS but there are medications that help. MS is not an inherited illness, although your risk of getting this disease is higher if you have a relative with MS. The best radiologic (x-ray) study for MS is an MRI (magnetic resonance imaging). There are medications available to decrease the number and frequency of attacks. SYMPTOMS  The symptoms of MS are caused by loss of insulation (myelin) of the nerves of the brain. When this happens, brain signals do not get transmitted properly or may not get transmitted at all. Some of the problems caused by this include:   Numbness.  Weakness.  Paralysis in extremities.  Visual problems, eye pain.  Balance problems.  Tremors. DIAGNOSIS  Your caregiver can do studies on you to make this diagnosis. This may include specialized X-rays and spinal fluid studies. HOME CARE INSTRUCTIONS   Take medications as directed by your caregiver. Baclofen is a drug commonly used to reduce muscle spasticity. Steroids are often used for short term relief.  Exercise as directed.  Use physical and occupational therapy as directed by  your caregiver. Careful attention to this medical care can help avoid depression.  See your caregiver if you begin to have problems with depression. This is a common problem in MS. Patients often continue to work many years after the diagnosis of MS. Document Released: 06/20/2000 Document Revised: 09/15/2011 Document Reviewed: 01/27/2007 [employer] Patient Information 2014 [employer].  

## 2013-05-20 NOTE — Progress Notes (Signed)
Reason for visit: Multiple sclerosis  Brandon [name] is an 22 y.o. male  History of present illness:  Brandon [name] is a 22 year old right-handed black male with a history of a schizoaffective disorder. The patient has evidence of an abnormal MRI of the brain, and he is felt to have multiple sclerosis. The patient could not tolerate a repeat MRI of the brain or cervical spinal cord. The patient was set up for a visual evoked response test, but he never had this study done. The patient been placed on Tecfidera, and he is tolerating this relatively well. The patient will have occasional flushing episodes, and some occasional diarrhea. The patient reports no significant changes in his balance, bowel or bladder function, visual acuity, or any new numbness or weakness of the face, arms, or legs. The patient returns for an evaluation. The patient indicates that he just had routine blood work done through his primary care physician.  Past Medical History  Diagnosis Date  . ADHD (attention deficit hyperactivity disorder)   . Hypertension   . Schizophrenia   . Bipolar 1 disorder   . 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 Obesity   . Multiple sclerosis 05/20/2013    History reviewed. No pertinent past surgical history.  Family History  Problem Relation Age of Onset  . Diabetes Mother   . ADD / ADHD Brother     Social history:  reports that he has been smoking Cigarettes.  He has been smoking about 0.25 packs per day. He has never used smokeless tobacco. He reports that he uses illicit drugs (Marijuana). He reports that he does not drink alcohol.   No Known Allergies  Medications:  Current Outpatient Prescriptions on File Prior to Visit  Medication Sig Dispense Refill  . amLODipine (NORVASC) 5 MG tablet Take 5 mg by mouth 2 (two) times daily.      . benztropine (COGENTIN) 1 MG tablet Take 1 mg by mouth at bedtime.        No current facility-administered medications on  file prior to visit.    ROS:  Out of a complete 14 system review of symptoms, the patient complains only of the following symptoms, and all other reviewed systems are negative.  Fevers, weight gain, fatigue Swelling in the legs  Blurred vision, loss of vision Snoring Constipation Increased thirst Joint pain, joint swelling, achy muscles Memory loss, confusion Depression, insomnia, hallucinations, racing thoughts, restless legs  Blood pressure 120/98, pulse 62, weight 356 lb (161.481 kg).  Physical Exam  General: The patient is alert and cooperative at the time of the examination. The patient is markedly obese.  Skin: 1+ edema ankles is noted bilaterally.   Neurologic Exam  Mental status: The patient is oriented x 3.  Cranial nerves: Facial symmetry is present. Speech is normal, no aphasia or dysarthria is noted. Extraocular movements are full. Visual fields are full.  Motor: The patient has good strength in all 4 extremities.  Sensory examination: Soft touch sensation on the face, arms, and legs he is symmetric.  Coordination: The patient has good finger-nose-finger and heel-to-shin bilaterally.  Gait and station: The patient has a normal gait. Tandem gait is slightly unsteady. Romberg is negative. No drift is seen.  Reflexes: Deep tendon reflexes are symmetric.   Assessment/Plan:  1. Multiple sclerosis  2. Obesity  3. Schizoaffective disorder  The patient is on Tecfidera currently, tolerating medication well. The patient has had blood work done recently. The patient will  followup through this office in 6 months. The visual evoked response test will be reordered.  Marlan Palau MD 05/20/2020 8:13 PM  [facility] [address] [place], [place] [address]  Phone [phone] Fax [phone]

## 2013-06-06 ENCOUNTER — Ambulatory Visit (INDEPENDENT_AMBULATORY_CARE_PROVIDER_SITE_OTHER): Payer: Medicaid Other

## 2013-06-06 ENCOUNTER — Telehealth: Payer: Self-pay | Admitting: Neurology

## 2013-06-06 DIAGNOSIS — G35 Multiple sclerosis: Secondary | ICD-10-CM

## 2013-06-06 DIAGNOSIS — R269 Unspecified abnormalities of gait and mobility: Secondary | ICD-10-CM

## 2013-06-06 DIAGNOSIS — R9082 White matter disease, unspecified: Secondary | ICD-10-CM

## 2013-06-06 DIAGNOSIS — R930 Abnormal findings on diagnostic imaging of skull and head, not elsewhere classified: Secondary | ICD-10-CM

## 2013-06-06 NOTE — Telephone Encounter (Signed)
I called the patient. The visual evoked response test was normal. 

## 2013-06-06 NOTE — Procedures (Signed)
    History:   Brandon [name] is a 22 year old gentleman with a history of an abnormal MRI the brain, felt to have multiple sclerosis. The patient is being evaluated for optic nerve dysfunction.  Description: The visual evoked response test was performed today using 32 x 32 check sizes. The absolute latencies for the N1 and the P100 wave forms were within normal limits bilaterally. The amplitudes for the P100 wave forms were also within normal limits bilaterally. The visual acuity was not documented.  Impression:  The visual evoked response test above was within normal limits bilaterally. No evidence of conduction slowing was seen within the anterior visual pathways on either side on today's evaluation.

## 2013-11-18 ENCOUNTER — Encounter (INDEPENDENT_AMBULATORY_CARE_PROVIDER_SITE_OTHER): Payer: Self-pay

## 2013-11-18 ENCOUNTER — Encounter: Payer: Self-pay | Admitting: Nurse Practitioner

## 2013-11-18 ENCOUNTER — Encounter (INDEPENDENT_AMBULATORY_CARE_PROVIDER_SITE_OTHER): Payer: Medicaid Other | Admitting: Nurse Practitioner

## 2013-11-18 NOTE — Progress Notes (Signed)
This encounter was created in error - please disregard.

## 2013-12-12 ENCOUNTER — Encounter: Payer: Self-pay | Admitting: Adult Health

## 2013-12-12 ENCOUNTER — Ambulatory Visit: Payer: Self-pay | Admitting: Adult Health

## 2013-12-12 ENCOUNTER — Ambulatory Visit (INDEPENDENT_AMBULATORY_CARE_PROVIDER_SITE_OTHER): Payer: Medicaid Other | Admitting: Adult Health

## 2013-12-12 VITALS — BP 147/97 | HR 82 | Ht 72.0 in | Wt 390.0 lb

## 2013-12-12 DIAGNOSIS — G35 Multiple sclerosis: Secondary | ICD-10-CM

## 2013-12-12 NOTE — Patient Instructions (Signed)
Multiple Sclerosis  Multiple sclerosis (MS) is a disease of the central nervous system. It leads to loss of the insulating covering of the nerves (myelin sheath) of your brain. When this happens, brain signals do not get transmitted properly or may not get transmitted at all. The symptoms of MS occur in episodes or attacks. These attacks may last weeks to months. There may be long periods of nearly no problems between attacks. The age of onset of MS varies.   CAUSES  The cause of MS is unknown. However, it is more common in the northern [place] than in the southern [place].  RISK FACTORS  There is a higher incidence of MS in women than in men. MS is not an inherited illness, although your risk of MS is higher if you have a relative with MS.  SIGNS AND SYMPTOMS   The symptoms of MS occur in episodes or attacks. These attacks may last weeks to months. There may be long periods of almost no symptoms between attacks.  The symptoms of MS vary. This is because of the many different ways it affects the central nervous system. The main symptoms of MS include:   Vision problems and eye pain.   Numbness.   Weakness.   Paralysis in your arms, hands, feet, and legs (extremities).   Balance problems.   Tremors.  DIAGNOSIS   Your health care provider can diagnose MS with the help of imaging exams and lab tests. These may include specialized X-ray exams and spinal fluid tests. The best imaging exam to confirm a diagnosis of MS is MRI.  TREATMENT   There is no known cure for MS, but there are medicines that can decrease the number and frequency of attacks. Steroids are often used for short-term relief. Physical and occupational therapy may also help.  HOME CARE INSTRUCTIONS    Take medicines as directed by your health care provider.   Exercise as directed by your health care provider.  SEEK MEDICAL CARE IF:  You begin to feel depressed.  SEEK IMMEDIATE MEDICAL CARE IF:   You develop paralysis.   You develop  problems with bladder, bowel, or sexual function.   You develop mental changes, such as forgetfulness or mood swings.   You have a seizure.  Document Released: 06/20/2000 Document Revised: 04/13/2013 Document Reviewed: 02/28/2013  [employer] Patient Information 2014 [employer].

## 2013-12-12 NOTE — Progress Notes (Signed)
I have read the note, and I agree with the clinical assessment and plan.  Charles K Willis   

## 2013-12-12 NOTE — Progress Notes (Signed)
PATIENT: Brandon [name] DOB: 1991-06-04  REASON FOR VISIT: follow up HISTORY FROM: patient  HISTORY OF PRESENT ILLNESS: Brandon [name] is a 23 year old right-handed black male with a history of a schizoaffective disorder and possible multiple sclerosis. Patient had an abnormal MRI that was consistent with multiple sclerosis. Patient was unable to tolerate further MRIs. A visual evoked response test was normal. The patient is currently taking Tecfidera and is tolerating it well. Patient denies fatigue, numbness or weakness. Denies trouble with ambulating. Patient states he does have some problems with his balance but denies falls. Denies trouble with the bowels and bladder. Denies any changes with his vision. Patient was recently diagnosed with pseudobulbar affect and was placed on nuedexta. Patient and his mom states that this medication has helped. Patient has an appointment in [place] next week to speak with a nutritionist regarding his weight in the hopes of preventing diabetes.   REVIEW OF SYSTEMS: Full 14 system review of systems performed and notable only for:  Constitutional: N/A  Eyes: N/A Ear/Nose/Throat: N/A  Skin: N/A  Cardiovascular: N/A  Respiratory: N/A  Gastrointestinal: N/A  Genitourinary: N/A Hematology/Lymphatic: N/A  Endocrine: N/A Musculoskeletal:N/A  Allergy/Immunology: N/A  Neurological: N/A Psychiatric: N/A Sleep: N/A   ALLERGIES: No Known Allergies  HOME MEDICATIONS: Outpatient Prescriptions Prior to Visit  Medication Sig Dispense Refill  . amLODipine (NORVASC) 5 MG tablet Take 5 mg by mouth 2 (two) times daily.      . benztropine (COGENTIN) 1 MG tablet Take 1 mg by mouth at bedtime.       Marland Kitchen buPROPion (WELLBUTRIN XL) 150 MG 24 hr tablet Take 300 mg by mouth daily.       . cloNIDine HCl (KAPVAY) 0.1 MG TB12 ER tablet Take 0.1 mg by mouth at bedtime.      Marland Kitchen Dextromethorphan-Quinidine (NUEDEXTA) 20-10 MG CAPS Take 10 mg by mouth 2 (two) times daily.       . Dimethyl Fumarate (TECFIDERA) 240 MG CPDR Take 240 mg by mouth 2 (two) times daily.      Marland Kitchen lithium carbonate (ESKALITH) 450 MG CR tablet Take 450 mg by mouth 2 (two) times daily.      Marland Kitchen OLANZapine Pamoate (ZYPREXA RELPREVV) 300 MG SUSR Inject 300 mg into the muscle every 14 (fourteen) days.      Marland Kitchen perphenazine (TRILAFON) 4 MG tablet Take 4 mg by mouth 2 (two) times daily.      . QUEtiapine (SEROQUEL XR) 400 MG 24 hr tablet Take 400 mg by mouth at bedtime.       No facility-administered medications prior to visit.    PAST MEDICAL HISTORY: Past Medical History  Diagnosis Date  . ADHD (attention deficit hyperactivity disorder)   . Hypertension   . Schizophrenia   . Bipolar 1 disorder   . 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 Obesity   . Multiple sclerosis 05/20/2013    PAST SURGICAL HISTORY: No past surgical history on file.  FAMILY HISTORY: Family History  Problem Relation Age of Onset  . Diabetes Mother   . ADD / ADHD Brother     SOCIAL HISTORY: History   Social History  . Marital Status: Single    Spouse Name: N/A    Number of Children: N/A  . Years of Education: N/A   Occupational History  . Not on file.   Social History Main Topics  . Smoking status: Current Every Day Smoker -- 0.25 packs/day  Types: Cigarettes  . Smokeless tobacco: Never Used  . Alcohol Use: No     Comment: Pt denies  . Drug Use: Yes    Special: Marijuana     Comment: Pt denies  . Sexual Activity: Not on file   Other Topics Concern  . Not on file   Social History Narrative  . No narrative on file      PHYSICAL EXAM  Filed Vitals:   12/12/13 1556  BP: 147/97  Pulse: 82  Height: 6' (1.829 m)  Weight: 390 lb (176.903 kg)   Body mass index is 52.88 kg/(m^2).  Generalized: Well developed, in no acute distress   Neurological examination  Mentation: Alert oriented to time, place, history taking. Follows all commands speech and language fluent Cranial  nerve II-XII:  Extraocular movements were full, visual field were full on confrontational test. Motor: The motor testing reveals 5 over 5 strength of all 4 extremities. Good symmetric motor tone is noted throughout.  Sensory: Sensory testing is intact to soft touch on all 4 extremities. No evidence of extinction is noted.  Coordination: Cerebellar testing reveals good finger-nose-finger and heel-to-shin bilaterally.  Gait and station: Gait is normal. Tandem gait is normal. Romberg is negative. No drift is seen.  Reflexes: Deep tendon reflexes are symmetric and normal bilaterally.    DIAGNOSTIC DATA (LABS, IMAGING, TESTING) - I reviewed patient records, labs, notes, testing and imaging myself where available.  Lab Results  Component Value Date   WBC 9.5 09/02/2012   HGB 13.9 09/02/2012   HCT 42.3 09/02/2012   MCV 80.3 09/02/2012   PLT 192 09/02/2012      Component Value Date/Time   NA 139 09/02/2012 0208   K 3.7 09/02/2012 0208   CL 105 09/02/2012 0208   CO2 25 09/02/2012 0208   GLUCOSE 91 09/02/2012 0208   BUN 13 09/02/2012 0208   CREATININE 0.92 09/02/2012 0208   CALCIUM 9.0 09/02/2012 0208   PROT 7.1 09/02/2012 0208   ALBUMIN 3.7 09/02/2012 0208   AST 16 09/02/2012 0208   ALT 27 09/02/2012 0208   ALKPHOS 82 09/02/2012 0208   BILITOT 0.3 09/02/2012 0208   GFRNONAA >90 09/02/2012 0208   GFRAA >90 09/02/2012 0208   Lab Results  Component Value Date   CHOL 113 01/29/2011   HDL 33* 01/29/2011   LDLCALC 70 01/29/2011   TRIG 52 01/29/2011   CHOLHDL 3.4 01/29/2011   Lab Results  Component Value Date   HGBA1C 5.7* 07/03/2011   Lab Results  Component Value Date   VITAMINB12 696 07/03/2011   Lab Results  Component Value Date   TSH 1.854 07/03/2011      ASSESSMENT AND PLAN 23 y.o. year old male  has a past medical history of ADHD (attention deficit hyperactivity disorder); Hypertension; Schizophrenia; Bipolar 1 disorder; White matter abnormality on MRI of brain (11/23/2012); Obesity; and  Multiple sclerosis (05/20/2013). here with:   1. Multiple sclerosis   Patient has remained stable. Continues to take Tecfidera and is tolerating it well.  Patient has an appointment this month with a nutritionist to help him manage his weight.  Recently had blood work at his PCP office. He will get them to fax over results.  Patient should follow-up in 6 months or sooner if needed   Butch PennyMegan Daniella Dewberry, MSN, NP-C 12/12/2013, 3:57 PM [facility] [address], [address] [place], [place] [address] [phone]  Note: This document was prepared with digital dictation and possible smart phrase technology.  Any transcriptional errors that result from this process are unintentional.

## 2014-01-10 ENCOUNTER — Other Ambulatory Visit: Payer: Self-pay

## 2014-01-10 MED ORDER — DIMETHYL FUMARATE 240 MG PO CPDR: 240.0000 mg | DELAYED_RELEASE_CAPSULE | Freq: Two times a day (BID) | ORAL | Status: DC

## 2014-01-18 ENCOUNTER — Inpatient Hospital Stay (HOSPITAL_COMMUNITY): Payer: Medicaid Other

## 2014-01-18 ENCOUNTER — Inpatient Hospital Stay (HOSPITAL_COMMUNITY)
Admission: EM | Admit: 2014-01-18 | Discharge: 2014-01-23 | DRG: 059 | Disposition: A | Discharge status: Home Health | Payer: Medicaid Other | Attending: Internal Medicine | Admitting: Internal Medicine

## 2014-01-18 ENCOUNTER — Emergency Department (HOSPITAL_COMMUNITY): Payer: Medicaid Other

## 2014-01-18 ENCOUNTER — Telehealth: Payer: Self-pay | Admitting: Adult Health

## 2014-01-18 DIAGNOSIS — F319 Bipolar disorder, unspecified: Secondary | ICD-10-CM | POA: Diagnosis present

## 2014-01-18 DIAGNOSIS — D72829 Elevated white blood cell count, unspecified: Secondary | ICD-10-CM | POA: Diagnosis present

## 2014-01-18 DIAGNOSIS — R9082 White matter disease, unspecified: Secondary | ICD-10-CM

## 2014-01-18 DIAGNOSIS — F1210 Cannabis abuse, uncomplicated: Secondary | ICD-10-CM | POA: Diagnosis present

## 2014-01-18 DIAGNOSIS — R29898 Other symptoms and signs involving the musculoskeletal system: Secondary | ICD-10-CM | POA: Diagnosis not present

## 2014-01-18 DIAGNOSIS — Z79899 Other long term (current) drug therapy: Secondary | ICD-10-CM | POA: Diagnosis not present

## 2014-01-18 DIAGNOSIS — F17200 Nicotine dependence, unspecified, uncomplicated: Secondary | ICD-10-CM | POA: Diagnosis present

## 2014-01-18 DIAGNOSIS — R471 Dysarthria and anarthria: Secondary | ICD-10-CM | POA: Diagnosis present

## 2014-01-18 DIAGNOSIS — Z833 Family history of diabetes mellitus: Secondary | ICD-10-CM | POA: Diagnosis not present

## 2014-01-18 DIAGNOSIS — Z9181 History of falling: Secondary | ICD-10-CM | POA: Diagnosis not present

## 2014-01-18 DIAGNOSIS — E669 Obesity, unspecified: Secondary | ICD-10-CM

## 2014-01-18 DIAGNOSIS — Z6841 Body Mass Index (BMI) 40.0 and over, adult: Secondary | ICD-10-CM | POA: Diagnosis not present

## 2014-01-18 DIAGNOSIS — R1310 Dysphagia, unspecified: Secondary | ICD-10-CM | POA: Diagnosis present

## 2014-01-18 DIAGNOSIS — R29810 Facial weakness: Secondary | ICD-10-CM | POA: Diagnosis present

## 2014-01-18 DIAGNOSIS — F909 Attention-deficit hyperactivity disorder, unspecified type: Secondary | ICD-10-CM | POA: Diagnosis present

## 2014-01-18 DIAGNOSIS — F259 Schizoaffective disorder, unspecified: Secondary | ICD-10-CM | POA: Diagnosis present

## 2014-01-18 DIAGNOSIS — G8190 Hemiplegia, unspecified affecting unspecified side: Secondary | ICD-10-CM | POA: Diagnosis present

## 2014-01-18 DIAGNOSIS — M6281 Muscle weakness (generalized): Secondary | ICD-10-CM

## 2014-01-18 DIAGNOSIS — Z818 Family history of other mental and behavioral disorders: Secondary | ICD-10-CM | POA: Diagnosis not present

## 2014-01-18 DIAGNOSIS — R209 Unspecified disturbances of skin sensation: Secondary | ICD-10-CM | POA: Diagnosis present

## 2014-01-18 DIAGNOSIS — G35 Multiple sclerosis: Principal | ICD-10-CM | POA: Diagnosis present

## 2014-01-18 DIAGNOSIS — I10 Essential (primary) hypertension: Secondary | ICD-10-CM | POA: Diagnosis present

## 2014-01-18 DIAGNOSIS — T380X5A Adverse effect of glucocorticoids and synthetic analogues, initial encounter: Secondary | ICD-10-CM | POA: Diagnosis present

## 2014-01-18 DIAGNOSIS — R531 Weakness: Secondary | ICD-10-CM | POA: Diagnosis present

## 2014-01-18 DIAGNOSIS — W19XXXA Unspecified fall, initial encounter: Secondary | ICD-10-CM

## 2014-01-18 DIAGNOSIS — R2681 Unsteadiness on feet: Secondary | ICD-10-CM | POA: Diagnosis present

## 2014-01-18 DIAGNOSIS — R270 Ataxia, unspecified: Secondary | ICD-10-CM

## 2014-01-18 DIAGNOSIS — R269 Unspecified abnormalities of gait and mobility: Secondary | ICD-10-CM | POA: Diagnosis present

## 2014-01-18 LAB — URINALYSIS, ROUTINE W REFLEX MICROSCOPIC
Bilirubin Urine: NEGATIVE
Glucose, UA: NEGATIVE mg/dL
Hgb urine dipstick: NEGATIVE
Ketones, ur: NEGATIVE mg/dL
Leukocytes, UA: NEGATIVE
Nitrite: NEGATIVE
Protein, ur: NEGATIVE mg/dL
Specific Gravity, Urine: 1.02 (ref 1.005–1.030)
Urobilinogen, UA: 1 mg/dL (ref 0.0–1.0)
pH: 6 (ref 5.0–8.0)

## 2014-01-18 LAB — COMPREHENSIVE METABOLIC PANEL
ALT: 36 U/L (ref 0–53)
AST: 18 U/L (ref 0–37)
Albumin: 4 g/dL (ref 3.5–5.2)
Alkaline Phosphatase: 96 U/L (ref 39–117)
Anion gap: 12 (ref 5–15)
BUN: 12 mg/dL (ref 6–23)
CO2: 27 mEq/L (ref 19–32)
Calcium: 9.4 mg/dL (ref 8.4–10.5)
Chloride: 103 mEq/L (ref 96–112)
Creatinine, Ser: 0.88 mg/dL (ref 0.50–1.35)
GFR calc Af Amer: >90 mL/min (ref >90)
GFR calc non Af Amer: >90 mL/min (ref >90)
Glucose, Bld: 87 mg/dL (ref 70–99)
Potassium: 4.2 mEq/L (ref 3.7–5.3)
Sodium: 142 mEq/L (ref 137–147)
Total Bilirubin: 0.4 mg/dL (ref 0.3–1.2)
Total Protein: 7.5 g/dL (ref 6.0–8.3)

## 2014-01-18 LAB — CBC WITH DIFFERENTIAL/PLATELET
Basophils Absolute: 0 10*3/uL (ref 0.0–0.1)
Basophils Relative: 0 % (ref 0–1)
Eosinophils Absolute: 0.3 10*3/uL (ref 0.0–0.7)
Eosinophils Relative: 4 % (ref 0–5)
HCT: 42.2 % (ref 39.0–52.0)
Hemoglobin: 13.5 g/dL (ref 13.0–17.0)
Lymphocytes Relative: 25 % (ref 12–46)
Lymphs Abs: 1.7 10*3/uL (ref 0.7–4.0)
MCH: 25.4 pg — ABNORMAL LOW (ref 26.0–34.0)
MCHC: 32 g/dL (ref 30.0–36.0)
MCV: 79.3 fL (ref 78.0–100.0)
Monocytes Absolute: 0.4 10*3/uL (ref 0.1–1.0)
Monocytes Relative: 6 % (ref 3–12)
Neutro Abs: 4.4 10*3/uL (ref 1.7–7.7)
Neutrophils Relative %: 65 % (ref 43–77)
Platelets: 194 10*3/uL (ref 150–400)
RBC: 5.32 MIL/uL (ref 4.22–5.81)
RDW: 14.5 % (ref 11.5–15.5)
WBC: 6.9 10*3/uL (ref 4.0–10.5)

## 2014-01-18 LAB — LITHIUM LEVEL: Lithium Lvl: 0.41 mEq/L — ABNORMAL LOW (ref 0.80–1.40)

## 2014-01-18 LAB — TSH: TSH: 0.848 u[IU]/mL (ref 0.350–4.500)

## 2014-01-18 MED ORDER — PERPHENAZINE 4 MG PO TABS
4.0000 mg | ORAL_TABLET | Freq: Two times a day (BID) | ORAL | Status: DC
  Administered 2014-01-18 – 2014-01-23 (×10): 4 mg via ORAL
  Filled 2014-01-18 (×11): qty 1

## 2014-01-18 MED ORDER — ENOXAPARIN SODIUM 100 MG/ML ~~LOC~~ SOLN
90.0000 mg | SUBCUTANEOUS | Status: DC
  Administered 2014-01-18 – 2014-01-22 (×5): 90 mg via SUBCUTANEOUS
  Filled 2014-01-18 (×5): qty 1

## 2014-01-18 MED ORDER — ACETAMINOPHEN 650 MG RE SUPP: 650.0000 mg | Freq: Four times a day (QID) | RECTAL | Status: DC | PRN

## 2014-01-18 MED ORDER — QUETIAPINE FUMARATE ER 400 MG PO TB24
400.0000 mg | ORAL_TABLET | Freq: Every day | ORAL | Status: DC
  Administered 2014-01-18 – 2014-01-22 (×5): 400 mg via ORAL
  Filled 2014-01-18 (×6): qty 1

## 2014-01-18 MED ORDER — DEXTROMETHORPHAN-QUINIDINE 20-10 MG PO CAPS
1.0000 | ORAL_CAPSULE | Freq: Two times a day (BID) | ORAL | Status: DC
  Administered 2014-01-19 – 2014-01-20 (×2): 1 via ORAL
  Filled 2014-01-18 (×5): qty 1

## 2014-01-18 MED ORDER — ACETAMINOPHEN 325 MG PO TABS: 650.0000 mg | ORAL_TABLET | Freq: Four times a day (QID) | ORAL | Status: DC | PRN

## 2014-01-18 MED ORDER — SODIUM CHLORIDE 0.9 % IV SOLN
1000.0000 mg | Freq: Every day | INTRAVENOUS | Status: AC
  Administered 2014-01-18 – 2014-01-20 (×3): 1000 mg via INTRAVENOUS
  Filled 2014-01-18 (×4): qty 8

## 2014-01-18 MED ORDER — LITHIUM CARBONATE ER 450 MG PO TBCR
450.0000 mg | EXTENDED_RELEASE_TABLET | Freq: Two times a day (BID) | ORAL | Status: DC
  Administered 2014-01-18 – 2014-01-23 (×10): 450 mg via ORAL
  Filled 2014-01-18 (×11): qty 1

## 2014-01-18 MED ORDER — BUPROPION HCL ER (XL) 150 MG PO TB24
300.0000 mg | ORAL_TABLET | Freq: Every day | ORAL | Status: DC
  Administered 2014-01-19 – 2014-01-23 (×5): 300 mg via ORAL
  Filled 2014-01-18 (×4): qty 2
  Filled 2014-01-18: qty 1
  Filled 2014-01-18: qty 2

## 2014-01-18 MED ORDER — ENOXAPARIN SODIUM 40 MG/0.4ML ~~LOC~~ SOLN: 40.0000 mg | SUBCUTANEOUS | Status: DC

## 2014-01-18 MED ORDER — OLANZAPINE PAMOATE 300 MG IM SUSR: 300.0000 mg | INTRAMUSCULAR | Status: DC

## 2014-01-18 MED ORDER — CLONIDINE HCL ER 0.1 MG PO TB12
0.1000 mg | ORAL_TABLET | Freq: Every day | ORAL | Status: DC
  Administered 2014-01-18 – 2014-01-22 (×5): 0.1 mg via ORAL
  Filled 2014-01-18 (×6): qty 1

## 2014-01-18 MED ORDER — BENZTROPINE MESYLATE 1 MG PO TABS
1.0000 mg | ORAL_TABLET | Freq: Every day | ORAL | Status: DC
  Administered 2014-01-18 – 2014-01-22 (×5): 1 mg via ORAL
  Filled 2014-01-18 (×6): qty 1

## 2014-01-18 MED ORDER — ONDANSETRON HCL 4 MG/2ML IJ SOLN: 4.0000 mg | Freq: Three times a day (TID) | INTRAMUSCULAR | Status: AC | PRN

## 2014-01-18 MED ORDER — DIMETHYL FUMARATE 240 MG PO CPDR
240.0000 mg | DELAYED_RELEASE_CAPSULE | Freq: Two times a day (BID) | ORAL | Status: DC
  Administered 2014-01-19 – 2014-01-23 (×8): 240 mg via ORAL
  Filled 2014-01-18 (×2): qty 1

## 2014-01-18 NOTE — Progress Notes (Signed)
ANTICOAGULATION CONSULT NOTE - Initial Consult  Pharmacy Consult for Lovenox Indication: VTE prophylaxis  No Known Allergies  Patient Measurements: Height: 6\' 1"  (185.4 cm) Weight: 390 lb 14 oz (177.3 kg) IBW/kg (Calculated) : 79.9  Vital Signs: Temp: 97.9 F (36.6 C) (07/15 1921) Temp src: Oral (07/15 1921) BP: 164/88 mmHg (07/15 1921) Pulse Rate: 77 (07/15 1921)  Labs:  Recent Labs  01/18/14 1025  HGB 13.5  HCT 42.2  PLT 194  CREATININE 0.88    Estimated Creatinine Clearance: 223.3 ml/min (by C-G formula based on Cr of 0.88).   Medical History: Past Medical History  Diagnosis Date  . ADHD (attention deficit hyperactivity disorder)   . Hypertension   . Schizophrenia   . Bipolar 1 disorder   . 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 Obesity   . Multiple sclerosis 05/20/2013    Medications:  See med rec  Assessment: Patient is a 75 you male with MS. Presented to ED s/p fall. Head CT negative for bleed. CBC and SCr WNL. No bleeding documented. With BMI >30, will adjust VTE prophylaxis lovenox dose to 0.5 mg/kg/day.  Goal of Therapy:  Anti-Xa level 0.3-0.6 units/ml 4hrs after LMWH dose given Monitor platelets by anticoagulation protocol: Yes   Plan:  Lovenox 90 mg (~0.5 mg/kg/day) Butternut qd  CBC q72h  Jenette Rayson P 01/18/2014,7:23 PM

## 2014-01-18 NOTE — ED Notes (Signed)
Pt returned from MRI, tech sts she was unable to finish the scan because the pt was unable to stay still and kept saying " I need to use the restroom". Pt offered urinal and bedpan but pt continued to refuse.

## 2014-01-18 NOTE — ED Provider Notes (Signed)
CSN: [id number]     Arrival date & time 01/18/14  8280 History   First MD Initiated Contact with Patient 01/18/14 [phone]     Chief Complaint  Patient presents with  . Fall  . Multiple Sclerosis     (Consider location/radiation/quality/duration/timing/severity/associated sxs/prior Treatment) HPI Brandon [name] is a 23 y.o. male, with history of possible MS, schizophrenia, bipolar disorder, morbid obesity, presents to emergency department with complaint of left sided weakness. Patient states he was walking yesterday when his left leg gave out and he fell down to the ground. He was unable to get up on his own because of "left side not working." Family helped him up and put him to bed and he has not gotten up until this morning. He states when he got up this morning and tried to walk he fell down again. He states his left side is still weak. This happened twice today. Family states similar episode happened 2 years ago and he was diagnosed with MS flare at that time. Patient is followed by Dr. Anne Hahn, based on chart review the diagnosis of MS is not definitive. Patient at this time denies any pain, denies any injuries during the fall, he denies any visual changes. Patient continues to have left-sided weakness. No other complaints   Past Medical History  Diagnosis Date  . ADHD (attention deficit hyperactivity disorder)   . Hypertension   . Schizophrenia   . Bipolar 1 disorder   . 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 Obesity   . Multiple sclerosis 05/20/2013   Past Surgical History  Procedure Laterality Date  . None     Family History  Problem Relation Age of Onset  . Diabetes Mother   . ADD / ADHD Brother    History  Substance Use Topics  . Smoking status: Current Every Day Smoker -- 0.25 packs/day    Types: Cigarettes  . Smokeless tobacco: Never Used  . Alcohol Use: No     Comment: Pt denies    Review of Systems  Constitutional: Negative for fever, chills and  fatigue.  Eyes: Negative for pain and visual disturbance.  Respiratory: Negative for cough, chest tightness and shortness of breath.   Cardiovascular: Negative for chest pain, palpitations and leg swelling.  Gastrointestinal: Negative for nausea, vomiting, abdominal pain, diarrhea and abdominal distention.  Genitourinary: Negative for dysuria, urgency, frequency and hematuria.  Musculoskeletal: Positive for gait problem. Negative for arthralgias, back pain, myalgias, neck pain and neck stiffness.  Skin: Negative for rash.  Neurological: Positive for weakness and numbness. Negative for dizziness, light-headedness and headaches.  All other systems reviewed and are negative.     Allergies  Review of patient's allergies indicates no known allergies.  Home Medications   Prior to Admission medications   Medication Sig Start Date End Date Taking? Authorizing Provider  amLODipine (NORVASC) 5 MG tablet Take 5 mg by mouth 2 (two) times daily.   Yes Historical Provider, MD  benztropine (COGENTIN) 1 MG tablet Take 1 mg by mouth at bedtime.    Yes Historical Provider, MD  buPROPion (WELLBUTRIN XL) 150 MG 24 hr tablet Take 300 mg by mouth daily.    Yes Historical Provider, MD  cloNIDine HCl (KAPVAY) 0.1 MG TB12 ER tablet Take 0.1 mg by mouth at bedtime.   Yes Historical Provider, MD  Dextromethorphan-Quinidine (NUEDEXTA) 20-10 MG CAPS Take 1 capsule by mouth 2 (two) times daily.    Yes Historical Provider, MD  Dimethyl Fumarate 240  MG CPDR Take 240 mg by mouth 2 (two) times daily. 01/10/14  Yes York Spaniel, MD  lithium carbonate (ESKALITH) 450 MG CR tablet Take 450 mg by mouth 2 (two) times daily.   Yes Historical Provider, MD  OLANZapine Pamoate (ZYPREXA RELPREVV) 300 MG SUSR Inject 300 mg into the muscle every 14 (fourteen) days.   Yes Historical Provider, MD  perphenazine (TRILAFON) 4 MG tablet Take 4 mg by mouth 2 (two) times daily.   Yes Historical Provider, MD  QUEtiapine (SEROQUEL XR) 400  MG 24 hr tablet Take 400 mg by mouth at bedtime.   Yes Historical Provider, MD   BP 124/104  Temp(Src) 97.9 F (36.6 C) (Oral)  Resp 12  SpO2 96% Physical Exam  Nursing note and vitals reviewed. Constitutional: He is oriented to person, place, and time. He appears well-developed and well-nourished. No distress.  Morbidly obese  HENT:  Head: Normocephalic and atraumatic.  Eyes: Conjunctivae and EOM are normal. Pupils are equal, round, and reactive to light.  Neck: Normal range of motion. Neck supple.  Cardiovascular: Normal rate, regular rhythm and normal heart sounds.   Pulmonary/Chest: Effort normal. No respiratory distress. He has no wheezes. He has no rales.  Abdominal: Soft. Bowel sounds are normal. He exhibits no distension. There is no tenderness. There is no rebound.  Musculoskeletal: He exhibits no edema.  Neurological: He is alert and oriented to person, place, and time.  Left facial drooping, includes forehead and eye lid/eye brow. Left pronator drift. Left grip weakness compared to right, 4/5. Left leg weakness compared to right 4/5 with hip flexion, foot planter and dorsiflexion. Sensation intact over the face bilaterally, over left upper and lower extremities.   Skin: Skin is warm and dry.    ED Course  Procedures (including critical care time) Labs Review Labs Reviewed  CBC WITH DIFFERENTIAL - Abnormal; Notable for the following:    MCH 25.4 (*)    All other components within normal limits  LITHIUM LEVEL - Abnormal; Notable for the following:    Lithium Lvl 0.41 (*)    All other components within normal limits  COMPREHENSIVE METABOLIC PANEL  URINALYSIS, ROUTINE W REFLEX MICROSCOPIC    Imaging Review Ct Head Wo Contrast  01/18/2014   CLINICAL DATA:  Multiple falls. Non ambulatory with left frontal headaches. History of multiple sclerosis.  EXAM: CT HEAD WITHOUT CONTRAST  TECHNIQUE: Contiguous axial images were obtained from the base of the skull through the vertex  without intravenous contrast.  COMPARISON:  Head CT 07/02/2011.  FINDINGS: The patient's head is tilted on the CT gantry.  There is no evidence of acute intracranial hemorrhage, mass lesion, brain edema or extra-axial fluid collection. The ventricles and subarachnoid spaces are prominent for age but stable. There is no CT evidence of acute cortical infarction. There is stable mild nonspecific periventricular and subcortical white matter disease.  The visualized paranasal sinuses, mastoid air cells and middle ears are clear. The calvarium is intact.  IMPRESSION: Stable atrophy and periventricular/subcortical white matter disease. No acute intracranial findings identified.   Electronically Signed   By: Roxy Horseman M.D.   On: 01/18/2014 11:32     EKG Interpretation   Date/Time:  Wednesday January 18 2014 10:01:48 EDT Ventricular Rate:  62 PR Interval:  128 QRS Duration: 97 QT Interval:  402 QTC Calculation: 408 R Axis:   34 Text Interpretation:  Sinus rhythm `no acute change compared w ecg 01/29/11  Confirmed by Denton Lank  MD, Caryn Bee ([id number])  on 01/18/2014 10:25:47 AM      MDM   Final diagnoses:  Left-sided weakness  Falls, initial encounter    Patient with history of MS here with left-sided weakness that started yesterday. On exam patient does have labs facial droop, left arm and leg weakness. Sensation is intact. Concerning for MS exacerbation. CT head ordered, labs ordered.   CT is negative for acute changes. Labs unremarkable, urine negative. I discussed with Dr. Cyril Mourningamillo with neurology, hewill come and see patient. Asked for medicine to admit. I I did tell have not started him on any medications and asked if he would want me to start IV steroids, he did state that he will see the patient and order them.   I spoke with [facility] they will admit patient.   Filed Vitals:   01/18/14 1324 01/18/14 1415 01/18/14 1430 01/18/14 1445  BP: 127/76  119/84 101/71  Pulse: 57 61 94   Temp:       TempSrc:      Resp: 17 14    SpO2: 100% 95% 100%      Kadar Chance A Burtis Imhoff, PA-C 01/18/14 1625

## 2014-01-18 NOTE — ED Notes (Signed)
MRI called. Patient noncompliant with scans at this time. Will continue to try

## 2014-01-18 NOTE — H&P (Signed)
[facility] History and Physical  Brandon [name] YQM:[id number] DOB: 24-Feb-1991 DOA: 01/18/2014  Referring physician: er PCP: Jackie PlumSEI-BONSU,GEORGE, MD   Chief Complaint: falls  HPI: Brandon [name] is a 23 y.o. male  With suspected MS. Per family at bedside and mother on the phone, he comes in with increasing left leg weakness and multiple falls.  Patient usually uses a walker but it has been broken.   Mother states that her son has fallen several times last night and today. Family originally was able to get him up and back to bed but the last time he fell they were unable to get him up. They called for an ambulance. The patient fell again after EMS got him up so they took him to the emergency room No fever, no chills, no SOB, no CP   He follows with Dr. Anne HahnWillis (neurology) last seen on 12/12/2013.    In the ER, his head CT was unremarkable.  Patient was seen by neurology and MRI was ordered along with steroids for possible MS exacerbation.  Labs were remarkably normal   Review of Systems:  All systems reviewed, negative unless stated above   Past Medical History  Diagnosis Date  . ADHD (attention deficit hyperactivity disorder)   . Hypertension   . Schizophrenia   . Bipolar 1 disorder   . 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 Obesity   . Multiple sclerosis 05/20/2013   Past Surgical History  Procedure Laterality Date  . None     Social History:  reports that he has been smoking Cigarettes.  He has been smoking about 0.25 packs per day. He has never used smokeless tobacco. He reports that he uses illicit drugs (Marijuana). He reports that he does not drink alcohol.  No Known Allergies  Family History  Problem Relation Age of Onset  . Diabetes Mother   . ADD / ADHD Brother      Prior to Admission medications   Medication Sig Start Date End Date Taking? Authorizing Provider  amLODipine (NORVASC) 5 MG tablet Take 5 mg by mouth 2 (two) times daily.   Yes  Historical Provider, MD  benztropine (COGENTIN) 1 MG tablet Take 1 mg by mouth at bedtime.    Yes Historical Provider, MD  buPROPion (WELLBUTRIN XL) 150 MG 24 hr tablet Take 300 mg by mouth daily.    Yes Historical Provider, MD  cloNIDine HCl (KAPVAY) 0.1 MG TB12 ER tablet Take 0.1 mg by mouth at bedtime.   Yes Historical Provider, MD  Dextromethorphan-Quinidine (NUEDEXTA) 20-10 MG CAPS Take 1 capsule by mouth 2 (two) times daily.    Yes Historical Provider, MD  Dimethyl Fumarate 240 MG CPDR Take 240 mg by mouth 2 (two) times daily. 01/10/14  Yes York Spanielharles K Willis, MD  lithium carbonate (ESKALITH) 450 MG CR tablet Take 450 mg by mouth 2 (two) times daily.   Yes Historical Provider, MD  OLANZapine Pamoate (ZYPREXA RELPREVV) 300 MG SUSR Inject 300 mg into the muscle every 14 (fourteen) days.   Yes Historical Provider, MD  perphenazine (TRILAFON) 4 MG tablet Take 4 mg by mouth 2 (two) times daily.   Yes Historical Provider, MD  QUEtiapine (SEROQUEL XR) 400 MG 24 hr tablet Take 400 mg by mouth at bedtime.   Yes Historical Provider, MD   Physical Exam: Filed Vitals:   01/18/14 1200  BP: 111/98  Pulse: 55  Temp:   Resp: 13    BP 111/98  Pulse 55  Temp(Src) 97.9 F (36.6 C) (Oral)  Resp 13  SpO2 100%  General:  Appears calm and comfortable Eyes: PERRL, normal lids, irises & conjunctiva ENT: grossly normal hearing, lips & tongue Neck: no LAD, masses or thyromegaly Cardiovascular: RRR, no m/r/g. No LE edema. Telemetry: SR, no arrhythmias  Respiratory: CTA bilaterally, no w/r/r. Normal respiratory effort. Abdomen: soft, ntnd Skin: no rash or induration seen on limited exam Musculoskeletal: left leg not able to be lifted as high as right Psychiatric: slow to respond Neurologic: grossly non-focal.          Labs on Admission:  Basic Metabolic Panel:  Recent Labs Lab 01/18/14 1025  NA 142  K 4.2  CL 103  CO2 27  GLUCOSE 87  BUN 12  CREATININE 0.88  CALCIUM 9.4   Liver Function  Tests:  Recent Labs Lab 01/18/14 1025  AST 18  ALT 36  ALKPHOS 96  BILITOT 0.4  PROT 7.5  ALBUMIN 4.0   No results found for this basename: LIPASE, AMYLASE,  in the last 168 hours No results found for this basename: AMMONIA,  in the last 168 hours CBC:  Recent Labs Lab 01/18/14 1025  WBC 6.9  NEUTROABS 4.4  HGB 13.5  HCT 42.2  MCV 79.3  PLT 194   Cardiac Enzymes: No results found for this basename: CKTOTAL, CKMB, CKMBINDEX, TROPONINI,  in the last 168 hours  BNP (last 3 results) No results found for this basename: PROBNP,  in the last 8760 hours CBG: No results found for this basename: GLUCAP,  in the last 168 hours  Radiological Exams on Admission: Ct Head Wo Contrast  01/18/2014   CLINICAL DATA:  Multiple falls. Non ambulatory with left frontal headaches. History of multiple sclerosis.  EXAM: CT HEAD WITHOUT CONTRAST  TECHNIQUE: Contiguous axial images were obtained from the base of the skull through the vertex without intravenous contrast.  COMPARISON:  Head CT 07/02/2011.  FINDINGS: The patient's head is tilted on the CT gantry.  There is no evidence of acute intracranial hemorrhage, mass lesion, brain edema or extra-axial fluid collection. The ventricles and subarachnoid spaces are prominent for age but stable. There is no CT evidence of acute cortical infarction. There is stable mild nonspecific periventricular and subcortical white matter disease.  The visualized paranasal sinuses, mastoid air cells and middle ears are clear. The calvarium is intact.  IMPRESSION: Stable atrophy and periventricular/subcortical white matter disease. No acute intracranial findings identified.   Electronically Signed   By: Roxy Horseman M.D.   On: 01/18/2014 11:32    EKG: Independently reviewed. Sinus QTc ok  Assessment/Plan Active Problems:   OBESITY   ADHD   Schizoaffective disorder   Abnormality of gait   Left-sided weakness   Left sided weakness with h/o MS- IV steroids, MRI,  PT eval- needs new walker as his is broken  ADHD/Schizo/psudobular affect- cont home meds  Obesity- encourage weight loss    neurology  Code Status: full Family Communication: mother on phone Disposition Plan: admit  Time spent: 65 min  Marlin Canary [facility] Pager [phone] **Disclaimer: This note may have been dictated with voice recognition software. Similar sounding words can inadvertently be transcribed and this note may contain transcription errors which may not have been corrected upon publication of note.**

## 2014-01-18 NOTE — ED Notes (Signed)
Service response called to send meal to new room.

## 2014-01-18 NOTE — Telephone Encounter (Signed)
[name] called and stated patient has started falling last evening and unable to walk this am.  [facility]  Please call and advise.

## 2014-01-18 NOTE — Telephone Encounter (Signed)
Please advise previous note. Thanks  °

## 2014-01-18 NOTE — ED Notes (Signed)
Pt stated that he is ready to go back to MRI. MRI contacted.

## 2014-01-18 NOTE — ED Notes (Signed)
Per EMS: patient is larger male (390 lbs) from home, history of MS for 3 years, fell yesterday in the tub with reported left sided weakness/difficulty walking and was helped by his father, normally able to walk around at home without any assistance or devices. Today fell again to his knees in bathroom and when EMS arrived, patient stood again with assistance and attempted to urinate in toilet, in doing so he fell again.  Patient states he hit his head on toilet, although it was not mentioned by EMS.  Denies pain, alert and oriented, asking for cookies  History of HTN, schizophrenia, and questionable diabetes.

## 2014-01-18 NOTE — Progress Notes (Signed)
Received report from ED RN. RN notified that pt is coming to 4N02 instead of 4N12.

## 2014-01-18 NOTE — ED Notes (Signed)
Dr. Cyril Mourningamillo- neurology at bedside.

## 2014-01-18 NOTE — ED Notes (Signed)
Hospitalist at bedside 

## 2014-01-18 NOTE — ED Notes (Signed)
Flow called about bed assignment. States patient is fourth on the list to get a bed on [address] [place]

## 2014-01-18 NOTE — ED Notes (Signed)
Meal ordered

## 2014-01-18 NOTE — Consult Note (Addendum)
Referring Physician: ED    Chief Complaint: left hemiparesis, left sided numbness, left face weakness.  HPI:                                                                                                                                         Brandon [name] is an 23 y.o. male, right handed, with a past medical history that is relevant for HTN, obesity, schizoaffective disorder, and abnormal MRI brain felt to be consistent with MS, brought in by family for further evaluation of the above stated symptoms. Patient seems to be a poor historian and thus I contacted family over the phone who indicated that on 6/30 they started noticing " some changes " characterized by a tendency to lean to the left, imbalance, and droopiness of the left side of the face. These changes had gotten progressively worse to the point that he can not ambulate safely and had sustained couple of falls since yesterday. He has been doing well on Tecfidera,which he seem to tolerate nicely. No recent fever or infection and he denies HA, vertigo, double vision, vision loss, confusion, difficulty swallowing, slurred speech, or language impairment. Brandon [name] was last seen by his neurologist Dr. Jannifer Franklin on 12/12/13. Recent visual evoked potential was unrevealing. Unenhanced CT brain today showed no acute abnormality. Date last known well: 01/03/14 Time last known well: uncertain tPA Given: no, late presentation   Past Medical History  Diagnosis Date  . ADHD (attention deficit hyperactivity disorder)   . Hypertension   . Schizophrenia   . Bipolar 1 disorder   . 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 Obesity   . Multiple sclerosis 05/20/2013    Past Surgical History  Procedure Laterality Date  . None      Family History  Problem Relation Age of Onset  . Diabetes Mother   . ADD / ADHD Brother    Social History:  reports that he has been smoking Cigarettes.  He has been smoking about 0.25 packs per day.  He has never used smokeless tobacco. He reports that he uses illicit drugs (Marijuana). He reports that he does not drink alcohol.  Allergies: No Known Allergies  Medications:  Scheduled:  ROS:                                                                                                                                       History obtained from the patient and family  General ROS: negative for - chills, fatigue, fever, night sweats, or weight loss Psychological ROS: negative for - memory difficulties, mood swings or suicidal ideation Ophthalmic ROS: negative for - blurry vision, double vision, eye pain or loss of vision ENT ROS: negative for - epistaxis, nasal discharge, oral lesions, sore throat, tinnitus or vertigo Allergy and Immunology ROS: negative for - hives or itchy/watery eyes Hematological and Lymphatic ROS: negative for - bleeding problems, bruising or swollen lymph nodes Endocrine ROS: negative for - galactorrhea, hair pattern changes, polydipsia/polyuria or temperature intolerance Respiratory ROS: negative for - cough, hemoptysis, shortness of breath or wheezing Cardiovascular ROS: negative for - chest pain, dyspnea on exertion, edema or irregular heartbeat Gastrointestinal ROS: negative for - abdominal pain, diarrhea, hematemesis, nausea/vomiting or stool incontinence Genito-Urinary ROS: negative for - dysuria, hematuria, incontinence or urinary frequency/urgency Musculoskeletal ROS: negative for - joint swelling Neurological ROS: as noted in HPI Dermatological ROS: negative for rash and skin lesion changes  Physical exam: pleasant male in no apparent distress.Blood pressure 111/98, pulse 55, temperature 97.9 F (36.6 C), temperature source Oral, resp. rate 13, SpO2 100.00%.  Head: normocephalic. Neck: supple, no bruits, no JVD. Cardiac: no  murmurs. Lungs: clear. Abdomen: soft, no tender, no mass. Extremities: no edema. Neurologic Examination:                                                                                                      General: Mental Status: Alert, oriented, thought content appropriate.  Speech fluent without evidence of aphasia.  Able to follow 3 step commands without difficulty. Cranial Nerves: II: Discs flat bilaterally; Visual fields grossly normal, pupils equal, round, reactive to light and accommodation III,IV, VI: ptosis not present, extra-ocular motions intact bilaterally V,VII: smile symmetric, facial light touch sensation normal bilaterally VIII: hearing normal bilaterally IX,X: gag reflex present XI: bilateral shoulder shrug XII: midline tongue extension without atrophy or fasciculations Motor: Significant for left hemiparesis. Tone diminished in the left side. Sensory: Pinprick and light touch intact throughout, bilaterally Deep Tendon Reflexes:  1 all over Plantars: Right: downgoing   Left: downgoing Cerebellar: normal finger-to-nose and  normal heel-to-shin test in the right. Can not perform in the left side due to weakness. Gait: unable to  test.  Results for orders placed during the hospital encounter of 01/18/14 (from the past 48 hour(s))  URINALYSIS, ROUTINE W REFLEX MICROSCOPIC     Status: None   Collection Time    01/18/14 10:24 AM      Result Value Ref Range   Color, Urine YELLOW  YELLOW   APPearance CLEAR  CLEAR   Specific Gravity, Urine 1.020  1.005 - 1.030   pH 6.0  5.0 - 8.0   Glucose, UA NEGATIVE  NEGATIVE mg/dL   Hgb urine dipstick NEGATIVE  NEGATIVE   Bilirubin Urine NEGATIVE  NEGATIVE   Ketones, ur NEGATIVE  NEGATIVE mg/dL   Protein, ur NEGATIVE  NEGATIVE mg/dL   Urobilinogen, UA 1.0  0.0 - 1.0 mg/dL   Nitrite NEGATIVE  NEGATIVE   Leukocytes, UA NEGATIVE  NEGATIVE   Comment: MICROSCOPIC NOT DONE ON URINES WITH NEGATIVE PROTEIN, BLOOD, LEUKOCYTES, NITRITE,  OR GLUCOSE <1000 mg/dL.  CBC WITH DIFFERENTIAL     Status: Abnormal   Collection Time    01/18/14 10:25 AM      Result Value Ref Range   WBC 6.9  4.0 - 10.5 K/uL   RBC 5.32  4.22 - 5.81 MIL/uL   Hemoglobin 13.5  13.0 - 17.0 g/dL   HCT 42.2  39.0 - 52.0 %   MCV 79.3  78.0 - 100.0 fL   MCH 25.4 (*) 26.0 - 34.0 pg   MCHC 32.0  30.0 - 36.0 g/dL   RDW 14.5  11.5 - 15.5 %   Platelets 194  150 - 400 K/uL   Neutrophils Relative % 65  43 - 77 %   Neutro Abs 4.4  1.7 - 7.7 K/uL   Lymphocytes Relative 25  12 - 46 %   Lymphs Abs 1.7  0.7 - 4.0 K/uL   Monocytes Relative 6  3 - 12 %   Monocytes Absolute 0.4  0.1 - 1.0 K/uL   Eosinophils Relative 4  0 - 5 %   Eosinophils Absolute 0.3  0.0 - 0.7 K/uL   Basophils Relative 0  0 - 1 %   Basophils Absolute 0.0  0.0 - 0.1 K/uL  COMPREHENSIVE METABOLIC PANEL     Status: None   Collection Time    01/18/14 10:25 AM      Result Value Ref Range   Sodium 142  137 - 147 mEq/L   Potassium 4.2  3.7 - 5.3 mEq/L   Chloride 103  96 - 112 mEq/L   CO2 27  19 - 32 mEq/L   Glucose, Bld 87  70 - 99 mg/dL   BUN 12  6 - 23 mg/dL   Creatinine, Ser 0.88  0.50 - 1.35 mg/dL   Calcium 9.4  8.4 - 10.5 mg/dL   Total Protein 7.5  6.0 - 8.3 g/dL   Albumin 4.0  3.5 - 5.2 g/dL   AST 18  0 - 37 U/L   ALT 36  0 - 53 U/L   Alkaline Phosphatase 96  39 - 117 U/L   Total Bilirubin 0.4  0.3 - 1.2 mg/dL   GFR calc non Af Amer >90  >90 mL/min   GFR calc Af Amer >90  >90 mL/min   Comment: (NOTE)     The eGFR has been calculated using the CKD EPI equation.     This calculation has not been validated in all clinical situations.     eGFR's persistently <90 mL/min signify possible Chronic Kidney  Disease.   Anion gap 12  5 - 15  LITHIUM LEVEL     Status: Abnormal   Collection Time    01/18/14 10:25 AM      Result Value Ref Range   Lithium Lvl 0.41 (*) 0.80 - 1.40 mEq/L   Ct Head Wo Contrast  01/18/2014   CLINICAL DATA:  Multiple falls. Non ambulatory with left frontal  headaches. History of multiple sclerosis.  EXAM: CT HEAD WITHOUT CONTRAST  TECHNIQUE: Contiguous axial images were obtained from the base of the skull through the vertex without intravenous contrast.  COMPARISON:  Head CT 07/02/2011.  FINDINGS: The patient's head is tilted on the CT gantry.  There is no evidence of acute intracranial hemorrhage, mass lesion, brain edema or extra-axial fluid collection. The ventricles and subarachnoid spaces are prominent for age but stable. There is no CT evidence of acute cortical infarction. There is stable mild nonspecific periventricular and subcortical white matter disease.  The visualized paranasal sinuses, mastoid air cells and middle ears are clear. The calvarium is intact.  IMPRESSION: Stable atrophy and periventricular/subcortical white matter disease. No acute intracranial findings identified.   Electronically Signed   By: Camie Patience M.D.   On: 01/18/2014 11:32    Assessment: 23 y.o. male who carries a diagnosis of RR-MS since 2012, comes in with left hemiparesis, left sided numbness and left face weakness since approximately 01/03/14. Has been doing well on Dimethyl fumarate (Tecfidera) until this presentation. Serologies including WBC and lymphocyte count are unremarkable, no recent fever or infection. Most likely MS relapse, but other etiologies including stroke must be excluded.  Dimethyl fumarate associated PML with and without associated severe lymphopenia have been recently described and therefore must be ruled out.  Recommend: 1) MRI brain and cervical spine with and without contrast (will attempt with IV sedation, although he is 380 pounds and had difficulty tolerating MRI before). 2) IV solumedrol 1 gram daily x 3 days. 3) Continue Tecfidera. 4) PT Will follow up.    Dorian Pod, MD [facility] [phone]  01/18/2014, 12:09 PM

## 2014-01-18 NOTE — Telephone Encounter (Signed)
I called the mother. She states that her son has fallen several times last night and today. Family originally was able to get him up and back to bed but the last time he fell they were unable to get him up. They called for an ambulance. The patient fell again after EMS got him up so they took him to the emergency room. This could be an exacerbation of his MS. At his last office visit the patient had no difficulty with ambulation. He is currently taking tecfidera. [name]

## 2014-01-18 NOTE — ED Notes (Signed)
Pt's mother called while he was in MRI. Will informed pt when he gets back.

## 2014-01-18 NOTE — ED Notes (Signed)
Neurologist at bedside. 

## 2014-01-19 ENCOUNTER — Ambulatory Visit: Payer: Medicaid Other | Admitting: Dietician

## 2014-01-19 ENCOUNTER — Encounter (HOSPITAL_COMMUNITY): Payer: Self-pay | Admitting: *Deleted

## 2014-01-19 DIAGNOSIS — G35 Multiple sclerosis: Principal | ICD-10-CM

## 2014-01-19 DIAGNOSIS — F259 Schizoaffective disorder, unspecified: Secondary | ICD-10-CM

## 2014-01-19 LAB — CBC
HCT: 43.1 % (ref 39.0–52.0)
Hemoglobin: 14 g/dL (ref 13.0–17.0)
MCH: 25.5 pg — ABNORMAL LOW (ref 26.0–34.0)
MCHC: 32.5 g/dL (ref 30.0–36.0)
MCV: 78.4 fL (ref 78.0–100.0)
Platelets: 237 10*3/uL (ref 150–400)
RBC: 5.5 MIL/uL (ref 4.22–5.81)
RDW: 14.1 % (ref 11.5–15.5)
WBC: 15 10*3/uL — ABNORMAL HIGH (ref 4.0–10.5)

## 2014-01-19 LAB — BASIC METABOLIC PANEL
Anion gap: 19 — ABNORMAL HIGH (ref 5–15)
BUN: 14 mg/dL (ref 6–23)
CO2: 18 mEq/L — ABNORMAL LOW (ref 19–32)
Calcium: 9.7 mg/dL (ref 8.4–10.5)
Chloride: 100 mEq/L (ref 96–112)
Creatinine, Ser: 0.78 mg/dL (ref 0.50–1.35)
GFR calc Af Amer: >90 mL/min (ref >90)
GFR calc non Af Amer: >90 mL/min (ref >90)
Glucose, Bld: 237 mg/dL — ABNORMAL HIGH (ref 70–99)
Potassium: 4.3 mEq/L (ref 3.7–5.3)
Sodium: 137 mEq/L (ref 137–147)

## 2014-01-19 NOTE — Progress Notes (Signed)
UR complete.  Ahonesty Woodfin RN, MSN 

## 2014-01-19 NOTE — Progress Notes (Signed)
ANTICOAGULATION CONSULT NOTE - Initial Consult  Pharmacy Consult for Lovenox Indication: VTE prophylaxis  No Known Allergies  Patient Measurements: Height: 6\' 1"  (185.4 cm) Weight: 390 lb 14 oz (177.3 kg) IBW/kg (Calculated) : 79.9  Vital Signs: Temp: 98.2 F (36.8 C) (07/16 0513) Temp src: Oral (07/16 0513) BP: 120/70 mmHg (07/16 0513) Pulse Rate: 93 (07/16 0513)  Labs:  Recent Labs  01/18/14 1025 01/19/14 0452  HGB 13.5 14.0  HCT 42.2 43.1  PLT 194 237  CREATININE 0.88 0.78    Estimated Creatinine Clearance: 245.6 ml/min (by C-G formula based on Cr of 0.78).   Medical History: Past Medical History  Diagnosis Date  . ADHD (attention deficit hyperactivity disorder)   . Hypertension   . Schizophrenia   . Bipolar 1 disorder   . 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 Obesity   . Multiple sclerosis 05/20/2013    Medications:  See med rec  Assessment: Patient is a 23 y/o male with MS presented to ED s/p fall. Head CT negative for bleed. H/H/PLTC and SCr WNL. No bleeding documented. VTE prophylaxis Lovenox dose adjusted for BMI >30. No s/sx of bleeding reported.    Goal of Therapy:  Anti-Xa level 0.3-0.6 units/ml 4hrs after LMWH dose given Monitor platelets by anticoagulation protocol: Yes   Plan:  - Continue Lovenox 90 mg (~0.5 mg/kg/day) [place] qd  - CBC q72h - Pharmacy signing off because dose adjustment is likely unnecessary given patient's age and stable status. Will follow peripherally.  Thank you for allowing pharmacy to be part of this patients care team  Kyarra Vancamp M. Tatsuya Okray, Pharm.D Clinical Pharmacy Resident Pager: [phone] 01/19/2014 .10:02 AM

## 2014-01-19 NOTE — Progress Notes (Signed)
[facility]                                                                              Patient Demographics  Brandon [name], is a 23 y.o. male, DOB - Sep 06, 1990, ZOX:[id number]  Admit date - 01/18/2014   Admitting Physician Joseph Art, DO  Outpatient Primary MD for the patient is OSEI-BONSU,GEORGE, MD  LOS - 1   Chief Complaint  Patient presents with  . Fall  . Multiple Sclerosis      HPI 01/18/2014 Brandon [name] is a 23 y.o. Male with suspected MS. Per family at bedside and mother on the phone, he came in with increasing left leg weakness and multiple falls. Patient usually uses a walker but it has been broken. Mother stated that her son has fallen several times.  Family originally was able to get him up and back to bed, but the last time he fell they were unable to get him up. They called for an ambulance. The patient fell again after EMS got him up so they took him to the emergency room.  Upon admission, patient denied fever, chills, shortness of breath, chest pain.  He follows with Dr. Anne Hahn (neurology) and was last seen on 12/12/2013. In the ER, his head CT was unremarkable. Patient was seen by neurology and MRI was ordered along with steroids for possible MS exacerbation. Labs were remarkably normal   Assessment & Plan   Left-sided weakness/possible multiple sclerosis exacerbation -Continue Tecfidera, possibly noncompliant.  He was on Tysabri.   -MRI: Probable hyperacute right frontoparietal demyelination a pattern suggesting tumefactive multiple sclerosis -Neurology consulted, following, and appreciated- recommended Solumedrol 1g for 3 days, Day 2 -PT consulted for evaluation and treatment  Obesity, BMI 51.7 -Encourage weight loss, diet modifications -Patient should discuss this with his primary care physician  Leukocytosis -Likely secondary to steroids -UA negative for infection, patient does not have cough or shortness of breath, and is afebrile  Bipolar  disorder, schizophrenia, ADHD -Continue Cogentin, Wellbutrin, lithium, loxapine, Seroquel, Trilafon, Dimethyl fumarate  Code Status: Full  Family Communication: None at bedside  Disposition Plan: Admitted.  Likely discharge 01/20/2014  Time Spent in minutes   30 minutes  Procedures  None  Consults   Neurology  DVT Prophylaxis  Heparin   Lab Results  Component Value Date   PLT 237 01/19/2014    Medications  Scheduled Meds: . benztropine  1 mg Oral QHS  . buPROPion  300 mg Oral Daily  . cloNIDine HCl  0.1 mg Oral QHS  . Dextromethorphan-Quinidine  1 capsule Oral BID  . Dimethyl Fumarate  240 mg Oral BID  . enoxaparin (LOVENOX) injection  90 mg Subcutaneous Q24H  . lithium carbonate  450 mg Oral BID  . methylPREDNISolone (SOLU-MEDROL) injection  1,000 mg Intravenous Daily  . OLANZapine Pamoate  300 mg Intramuscular Q14 Days  . perphenazine  4 mg Oral BID  . QUEtiapine  400 mg Oral QHS   Continuous Infusions:  PRN Meds:.acetaminophen, acetaminophen  Antibiotics    Anti-infectives   None      Subjective:   Brandon [name] seen and examined today.  Patient continues to complain of weakness, but states  he feels improved since coming to the hospital.  He denies chest pain, dizziness, headache, shortness of breath, abdominal pain.  Objective:   Filed Vitals:   01/18/14 1921 01/19/14 0008 01/19/14 0157 01/19/14 0513  BP: 164/88 142/84 141/72 120/70  Pulse: 77 86 91 93  Temp: 97.9 F (36.6 C) 97.7 F (36.5 C) 98 F (36.7 C) 98.2 F (36.8 C)  TempSrc: Oral Oral Oral Oral  Resp: 16 16 18 19   Height:      Weight:      SpO2: 99% 98% 100% 94%    Wt Readings from Last 3 Encounters:  01/18/14 177.3 kg (390 lb 14 oz)  12/12/13 176.903 kg (390 lb)  11/18/13 176.449 kg (389 lb)     Intake/Output Summary (Last 24 hours) at 01/19/14 1102 Last data filed at 01/19/14 0319  Gross per 24 hour  Intake      0 ml  Output    375 ml  Net   -375 ml     Exam  General: Well developed, well nourished, NAD, appears stated age  HEENT: NCAT, mucous membranes moist.   Neck: Supple, no JVD, no masses  Cardiovascular: S1 S2 auscultated, no rubs, murmurs or gallops. Regular rate and rhythm.  Respiratory: Clear to auscultation bilaterally with equal chest rise  Abdomen: Soft, Obese, nontender, nondistended, + bowel sounds  Extremities: warm dry without cyanosis clubbing or edema  Neuro: AAOx3, cranial nerves grossly intact. Strength equal and bilateral in the upper/lower ext.   Skin: Without rashes exudates or nodules  Data Review   Micro Results No results found for this or any previous visit (from the past 240 hour(s)).  Radiology Reports Ct Head Wo Contrast  01/18/2014   CLINICAL DATA:  Multiple falls. Non ambulatory with left frontal headaches. History of multiple sclerosis.  EXAM: CT HEAD WITHOUT CONTRAST  TECHNIQUE: Contiguous axial images were obtained from the base of the skull through the vertex without intravenous contrast.  COMPARISON:  Head CT 07/02/2011.  FINDINGS: The patient's head is tilted on the CT gantry.  There is no evidence of acute intracranial hemorrhage, mass lesion, brain edema or extra-axial fluid collection. The ventricles and subarachnoid spaces are prominent for age but stable. There is no CT evidence of acute cortical infarction. There is stable mild nonspecific periventricular and subcortical white matter disease.  The visualized paranasal sinuses, mastoid air cells and middle ears are clear. The calvarium is intact.  IMPRESSION: Stable atrophy and periventricular/subcortical white matter disease. No acute intracranial findings identified.   Electronically Signed   By: Roxy Horseman M.D.   On: 01/18/2014 11:32   Mr Laqueta Jean ZO Contrast  01/19/2014   CLINICAL DATA:  Multiple scleroses versus PML.  EXAM: MRI HEAD WITHOUT AND WITH CONTRAST  MRI CERVICAL SPINE WITHOUT AND WITH CONTRAST  TECHNIQUE: Multiplanar,  multiecho pulse sequences of the brain and surrounding structures, and cervical spine, to include the craniocervical junction and cervicothoracic junction, were obtained without and with intravenous contrast.  CONTRAST:  20 cc MultiHance  COMPARISON:  CT of the head January 18, 2014  FINDINGS: MRI HEAD FINDINGS  Large body habitus results in decreased overall signal to noise ratio.  Patchy reduced diffusion with corresponding low ADC values in the right posterior frontal and anterior parietal white matter. Confluent reduced diffusion with T2 shine through in right frontal, left temporal periventricular white matter. Lesion show corresponding bright T2 signal. There are additional far greater than 10 supratentorial and at least 1 at left  inferior cerebellar white matter lesion, somewhat confluent in the supratentorial brain, many of which radiate from the periventricular margin. Mild enhancement associated the right frontal subacute changes. Lesion show low T1 signal suggesting black holes of demyelination. No midline shift or mass effect. No susceptibility artifact to suggest hemorrhage.  Moderate ventriculomegaly, likely on the basis of global parenchymal brain volume loss as there is commensurate enlargement of cerebral sulci and cerebellar folia. No abnormal extra-axial fluid collections. Normal major intracranial vascular flow voids seen at the skull base. No abnormal leptomeningeal nor extra-axial masses.  Ocular globes and orbital contents are nonsuspicious though not tailored for evaluation and, optic neuritis could not be excluded on the basis of this examination. No paranasal sinus air-fluid levels. Mastoid air cells appear well-aerated. No abnormal sellar expansion. No cerebellar tonsillar ectopia. No suspicious calvarial bone marrow signal.  MRI CERVICAL SPINE FINDINGS  Large body habitus resultant noisy image quality, in addition, this is a mild to moderately motion degraded examination. Multiple sequences  re-attempted without image quality improvement.  Cervical vertebral bodies and posterior elements are intact and aligned with straightened cervical lordosis. Intervertebral discs demonstrate normal morphology and signal characteristics. No abnormal osseous or intradiscal enhancement.  Cervical spinal cord appears normal morphology and signal characteristics, motion degrades sensitivity for subtle parenchymal signal abnormality. No abnormal cord, leptomeningeal nor epidural enhancement. Included prevertebral and paraspinal soft tissues are nonsuspicious.  No significant disc bulge, canal stenosis or neural foraminal narrowing at any cervical level.  IMPRESSION: MRI of the brain: Probable hyperacute right frontoparietal demyelination a pattern suggesting tumefactive multiple sclerosis, in a background of acute to subacute supra and infratentorial demyelination. Minimal enhancement associated with the subacute changes in the right frontal lobe. Moderate parenchymal brain volume loss for age.  No definite findings of PML though, recommend correlation with immune status and history of Tsyabri medication.  MRI of the cervical spine: Habitus limited examination. No convincing evidence of demyelination within the cervical spinal cord though habitus and motion degrades sensitivity.  Straightened cervical lordosis without acute fracture, malalignment or neural compressive changes.   Electronically Signed   By: Awilda Metroourtnay  Bloomer   On: 01/19/2014 01:57   Mr Cervical Spine W Wo Contrast  01/19/2014   CLINICAL DATA:  Multiple scleroses versus PML.  EXAM: MRI HEAD WITHOUT AND WITH CONTRAST  MRI CERVICAL SPINE WITHOUT AND WITH CONTRAST  TECHNIQUE: Multiplanar, multiecho pulse sequences of the brain and surrounding structures, and cervical spine, to include the craniocervical junction and cervicothoracic junction, were obtained without and with intravenous contrast.  CONTRAST:  20 cc MultiHance  COMPARISON:  CT of the head January 18, 2014  FINDINGS: MRI HEAD FINDINGS  Large body habitus results in decreased overall signal to noise ratio.  Patchy reduced diffusion with corresponding low ADC values in the right posterior frontal and anterior parietal white matter. Confluent reduced diffusion with T2 shine through in right frontal, left temporal periventricular white matter. Lesion show corresponding bright T2 signal. There are additional far greater than 10 supratentorial and at least 1 at left inferior cerebellar white matter lesion, somewhat confluent in the supratentorial brain, many of which radiate from the periventricular margin. Mild enhancement associated the right frontal subacute changes. Lesion show low T1 signal suggesting black holes of demyelination. No midline shift or mass effect. No susceptibility artifact to suggest hemorrhage.  Moderate ventriculomegaly, likely on the basis of global parenchymal brain volume loss as there is commensurate enlargement of cerebral sulci and cerebellar folia. No abnormal extra-axial fluid  collections. Normal major intracranial vascular flow voids seen at the skull base. No abnormal leptomeningeal nor extra-axial masses.  Ocular globes and orbital contents are nonsuspicious though not tailored for evaluation and, optic neuritis could not be excluded on the basis of this examination. No paranasal sinus air-fluid levels. Mastoid air cells appear well-aerated. No abnormal sellar expansion. No cerebellar tonsillar ectopia. No suspicious calvarial bone marrow signal.  MRI CERVICAL SPINE FINDINGS  Large body habitus resultant noisy image quality, in addition, this is a mild to moderately motion degraded examination. Multiple sequences re-attempted without image quality improvement.  Cervical vertebral bodies and posterior elements are intact and aligned with straightened cervical lordosis. Intervertebral discs demonstrate normal morphology and signal characteristics. No abnormal osseous or intradiscal  enhancement.  Cervical spinal cord appears normal morphology and signal characteristics, motion degrades sensitivity for subtle parenchymal signal abnormality. No abnormal cord, leptomeningeal nor epidural enhancement. Included prevertebral and paraspinal soft tissues are nonsuspicious.  No significant disc bulge, canal stenosis or neural foraminal narrowing at any cervical level.  IMPRESSION: MRI of the brain: Probable hyperacute right frontoparietal demyelination a pattern suggesting tumefactive multiple sclerosis, in a background of acute to subacute supra and infratentorial demyelination. Minimal enhancement associated with the subacute changes in the right frontal lobe. Moderate parenchymal brain volume loss for age.  No definite findings of PML though, recommend correlation with immune status and history of Tsyabri medication.  MRI of the cervical spine: Habitus limited examination. No convincing evidence of demyelination within the cervical spinal cord though habitus and motion degrades sensitivity.  Straightened cervical lordosis without acute fracture, malalignment or neural compressive changes.   Electronically Signed   By: Awilda Metro   On: 01/19/2014 01:57    CBC  Recent Labs Lab 01/18/14 1025 01/19/14 0452  WBC 6.9 15.0*  HGB 13.5 14.0  HCT 42.2 43.1  PLT 194 237  MCV 79.3 78.4  MCH 25.4* 25.5*  MCHC 32.0 32.5  RDW 14.5 14.1  LYMPHSABS 1.7  --   MONOABS 0.4  --   EOSABS 0.3  --   BASOSABS 0.0  --     Chemistries   Recent Labs Lab 01/18/14 1025 01/19/14 0452  NA 142 137  K 4.2 4.3  CL 103 100  CO2 27 18*  GLUCOSE 87 237*  BUN 12 14  CREATININE 0.88 0.78  CALCIUM 9.4 9.7  AST 18  --   ALT 36  --   ALKPHOS 96  --   BILITOT 0.4  --    ------------------------------------------------------------------------------------------------------------------ estimated creatinine clearance is 245.6 ml/min (by C-G formula based on Cr of  0.78). ------------------------------------------------------------------------------------------------------------------ No results found for this basename: HGBA1C,  in the last 72 hours ------------------------------------------------------------------------------------------------------------------ No results found for this basename: CHOL, HDL, LDLCALC, TRIG, CHOLHDL, LDLDIRECT,  in the last 72 hours ------------------------------------------------------------------------------------------------------------------  Recent Labs  01/18/14 2015  TSH 0.848   ------------------------------------------------------------------------------------------------------------------ No results found for this basename: VITAMINB12, FOLATE, FERRITIN, TIBC, IRON, RETICCTPCT,  in the last 72 hours  Coagulation profile No results found for this basename: INR, PROTIME,  in the last 168 hours  No results found for this basename: DDIMER,  in the last 72 hours  Cardiac Enzymes No results found for this basename: CK, CKMB, TROPONINI, MYOGLOBIN,  in the last 168 hours ------------------------------------------------------------------------------------------------------------------ No components found with this basename: POCBNP,     Khylee Algeo D.O. on 01/19/2014 at 11:02 AM  Between 7am to 7pm - Pager - [phone]  After 7pm go to [email or web address] - password [facility]  And look for the night coverage person covering for me after hours  [facility] Office  [phone]

## 2014-01-19 NOTE — Progress Notes (Signed)
Rehab Admissions Coordinator Note:  Patient was screened by Clois Dupes for appropriateness for an Inpatient Acute Rehab Consult per PT recommendation.   At this time, we are recommending Inpatient Rehab consult. I will contact Dr. Catha Gosselin.  Clois Dupes 01/19/2014, 4:52 PM  I can be reached at [phone].

## 2014-01-19 NOTE — Evaluation (Signed)
Physical Therapy Evaluation Patient Details Name: Brandon [name] MRN: [id number] DOB: Jul 30, 1990 Today's Date: 01/19/2014   History of Present Illness  23 y.o. male admitted to [facility] on 01/18/14 with left sided weakness and multiple falls at home.  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 Pt with significant PMHx of ADHD, HTN, schizophrenia, bipolar disorder, obesity, and MS (looks like he was being followed by Dr. Anne Hahn as an OP).    Clinical Impression  Pt with significant cognitive and physical deficits.  Presents with left sided weakness.  Pt was unable to stand with two person assist today, and had significant difficulty with bed mobility and rolling.  He is very impaired and very young.  Pt would benefit from CIR level therapies before d/c.   PT to follow acutely for deficits listed below.       Follow Up Recommendations CIR    Equipment Recommendations  Wheelchair (measurements PT);Wheelchair cushion (measurements PT);Hospital bed;Other (comment) (hoyer lift)    Recommendations for Other Services Speech consult;OT consult     Precautions / Restrictions Precautions Precautions: Fall Precaution Comments: pt presenting with left hemipresis, h/o multiple falls at home PTA      Mobility  Bed Mobility Overal bed mobility: +2 for physical assistance;Needs Assistance Bed Mobility: Supine to Sit;Sit to Supine     Supine to sit: +2 for physical assistance;Mod assist Sit to supine: +2 for physical assistance;Max assist   General bed mobility comments: Two person mod assist to get to the right side of the bed.  Pt using momentum to throw his weak side over the side of the bed and was able to with assistance to get bil legs all the way over and some assistance of his trunk come to sitting easier than anticipated.  Mod assis to support trunk and legs during transition overall.  There was less help from pt and more falling back with transition back  to bed, so max assist of both trunk and bil legs to get back to supine from sitting.   Transfers Overall transfer level: Needs assistance Equipment used: None Transfers: Sit to/from Stand Sit to Stand: +2 physical assistance;Total assist         General transfer comment: Pt was unable with hand on bed rail and elevated bed to come to standing.  He was unable with two person assist and left knee blocked to even clear or lift his buttocks off of the bed.  He tried x2, but was not strong enough.   Ambulation/Gait             General Gait Details: Unable at this time.          Balance Overall balance assessment: Needs assistance Sitting-balance support: Feet supported;Single extremity supported Sitting balance-Leahy Scale: Poor Sitting balance - Comments: Min assist EOB.  Pt with posterior lean at times Postural control: Posterior lean Standing balance support: Single extremity supported Standing balance-Leahy Scale: Zero Standing balance comment: Unable to stand even with two person assist.                              Pertinent Vitals/Pain See vitals flow sheet.     Home Living Family/patient expects to be discharged to:: Private residence Living Arrangements: Parent (and brother (17 years), mother is present in an electric WC) Available Help at Discharge: Available 24 hours/day Type of Home: House Home Access: Stairs to enter Entrance Stairs-Rails:  None Entrance Stairs-Number of Steps: 1 Home Layout: One level Home Equipment: Walker - 2 wheels Aeronautical engineer(Rollator)      Prior Function Level of Independence: Independent with assistive device(s)         Comments: Used rollator before     Hand Dominance   Dominant Hand: Right    Extremity/Trunk Assessment   Upper Extremity Assessment: Defer to OT evaluation           Lower Extremity Assessment: RLE deficits/detail;LLE deficits/detail RLE Deficits / Details: WNL LLE Deficits / Details: Left leg  difficult to assess either due to poor processing, poor command following and/or weakness in this leg.  Pt demonstrated some active quad if therapist initiated the movement to lift his leg.  Functionally he move his knee into flexion in the bed in sidelying (gravity eliminated position). I never saw any active movement of his left foot.    Cervical / Trunk Assessment: Normal  Communication   Communication: No difficulties  Cognition Arousal/Alertness: Lethargic Behavior During Therapy: Flat affect (Very lethargic) Overall Cognitive Status: Impaired/Different from baseline Area of Impairment: Attention;Following commands;Awareness;Problem solving   Current Attention Level: Focused Memory: Decreased short-term memory Following Commands: Follows one step commands inconsistently;Follows one step commands with increased time   Awareness: Intellectual Problem Solving: Slow processing;Decreased initiation;Difficulty sequencing;Requires tactile cues;Requires verbal cues General Comments: Pt with interesting cognitive presentation today.  He seems to have decreased attention, at time responding right away to questions, at other times seeming to lose his focus on what was aske or said vs slow processing.               Assessment/Plan    PT Assessment Patient needs continued PT services  PT Diagnosis Difficulty walking;Abnormality of gait;Generalized weakness;Hemiplegia non-dominant side;Altered mental status   PT Problem List Decreased strength;Decreased activity tolerance;Decreased balance;Decreased cognition;Decreased coordination;Decreased mobility;Decreased knowledge of use of DME;Decreased safety awareness;Decreased knowledge of precautions;Obesity;Impaired sensation  PT Treatment Interventions DME instruction;Gait training;Stair training;Functional mobility training;Therapeutic activities;Therapeutic exercise;Balance training;Neuromuscular re-education;Cognitive remediation;Patient/family  education   PT Goals (Current goals can be found in the Care Plan section) Acute Rehab PT Goals Patient Stated Goal: none stated PT Goal Formulation: Patient unable to participate in goal setting Time For Goal Achievement: 02/02/14 Potential to Achieve Goals: Fair    Frequency Min 3X/week   Barriers to discharge Decreased caregiver support Mother is in an Art gallery managerelectric scooter       End of Session Equipment Utilized During Treatment: Gait belt Activity Tolerance: Patient limited by fatigue Patient left: in bed;with call bell/phone within reach;with bed alarm set Nurse Communication: Mobility status         Time: 1123-1219 PT Time Calculation (min): 56 min   Charges:   PT Evaluation $Initial PT Evaluation Tier I: 1 Procedure PT Treatments $Therapeutic Activity: 38-52 mins        Kamaree Berkel B. Kirstein Baxley, PT, DPT [phone]    01/19/2014, 4:46 PM

## 2014-01-19 NOTE — Evaluation (Signed)
Clinical/Bedside Swallow Evaluation Patient Details  Name: Brandon LawrenceRaymond S Mandarino MRN: [id number] Date of Birth: 1991/02/25  Today's Date: 01/19/2014 Time: 1333-1400 SLP Time Calculation (min): 27 min  Past Medical History:  Past Medical History  Diagnosis Date  . ADHD (attention deficit hyperactivity disorder)   . Hypertension   . Schizophrenia   . Bipolar 1 disorder   . 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 Obesity   . Multiple sclerosis 05/20/2013   Past Surgical History:  Past Surgical History  Procedure Laterality Date  . None     HPI:  23 y/o male with PMH of RR-MS since 2012, comes in with left hemiparesis, left sided numbness and left face weakness since approximately 01/03/14. 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    Assessment / Plan / Recommendation Clinical Impression  Pt has left-sided labial and lingual weakness with reduced sensation resulting left-sided anterior loss and buccal pocketing with regular textures. SLP provided Min cues for self-monitoring and management. Recommend to initiate Dys 3 textures and thin liquids with intermittent supervision and set-up assist as needed. SLP to follow for tolerance and readiness for advancement; MD may wish to consider SLP cognitive-linguistic evaluation to assess speech changes as well.    Aspiration Risk  Mild    Diet Recommendation Dysphagia 3 (Mechanical Soft);Thin liquid   Liquid Administration via: Cup;Straw Medication Administration: Whole meds with puree Supervision: Patient able to self feed;Intermittent supervision to cue for compensatory strategies Compensations: Slow rate;Small sips/bites;Check for pocketing;Check for anterior loss Postural Changes and/or Swallow Maneuvers: Seated upright 90  degrees;Upright 30-60 min after meal    Other  Recommendations Oral Care Recommendations: Oral care BID   Follow Up Recommendations  Outpatient SLP    Frequency and Duration min 2x/week  1 week   Pertinent Vitals/Pain n/a    SLP Swallow Goals     Swallow Study Prior Functional Status       General Date of Onset: 01/18/14 HPI: 23 y/o male with PMH of RR-MS since 2012, comes in with left hemiparesis, left sided numbness and left face weakness since approximately 01/03/14. 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  Type of Study: Bedside swallow evaluation Previous Swallow Assessment: none in chart Diet Prior to this Study: NPO Temperature Spikes Noted: No Respiratory Status: Room air History of Recent Intubation: No Behavior/Cognition: Alert;Cooperative;Other (comment) (slow to respond) Oral Cavity - Dentition: Adequate natural dentition Self-Feeding Abilities: Able to feed self Patient Positioning: Upright in bed Baseline Vocal Quality: Low vocal intensity Volitional Cough: Strong Volitional Swallow: Able to elicit    Oral/Motor/Sensory Function Overall Oral Motor/Sensory Function: Impaired Labial ROM: Reduced left Labial Symmetry: Abnormal symmetry left Labial Strength: Reduced Labial Sensation: Reduced Lingual ROM: Reduced left Lingual Symmetry: Abnormal symmetry left (deviates to left) Lingual Strength: Reduced Lingual Sensation: Reduced Facial ROM: Reduced left Facial Symmetry: Left droop Facial Strength: Reduced Velum:  (unable to view) Mandible: Within Functional Limits   Ice Chips Ice chips: Not tested   Thin Liquid Thin Liquid: Within functional limits Presentation: Self Fed;Straw    Nectar Thick Nectar Thick Liquid: Not tested   Honey Thick Honey Thick Liquid:  Not  tested   Puree Puree: Within functional limits Presentation: Self Fed;Spoon   Solid   GO    Solid: Impaired Presentation: Self Fed Oral Phase Impairments: Reduced lingual movement/coordination;Impaired anterior to posterior transit Oral Phase Functional Implications: Left lateral sulci pocketing        Maxcine Ham, M.A. CCC-SLP [phone]  Maxcine Ham 01/19/2014,3:22 PM

## 2014-01-19 NOTE — Progress Notes (Signed)
Subjective: No overnight events. Complains of no new symptoms, has persistent left side hemiparesis. His mother at this bedside reports that his speech has been slightly slurred since his presentation but his mentation is at his baseline.   Objective: Current vital signs: BP 120/70  Pulse 93  Temp(Src) 98.2 F (36.8 C) (Oral)  Resp 19  Ht 6\' 1"  (1.854 m)  Wt 390 lb 14 oz (177.3 kg)  BMI 51.58 kg/m2  SpO2 94% Vital signs in last 24 hours: Temp:  [97.7 F (36.5 C)-98.2 F (36.8 C)] 98.2 F (36.8 C) (07/16 0513) Pulse Rate:  [55-94] 93 (07/16 0513) Resp:  [13-19] 19 (07/16 0513) BP: (101-164)/(70-99) 120/70 mmHg (07/16 0513) SpO2:  [94 %-100 %] 94 % (07/16 0513) Weight:  [390 lb 14 oz (177.3 kg)] 390 lb 14 oz (177.3 kg) (07/15 1834)  Intake/Output from previous day: 07/15 0701 - 07/16 0700 In: -  Out: 375 [Urine:375] Intake/Output this shift: Vitals reviewed. General: resting in bed, in NAD. Morbidly obese HEENT: PERRL, EOMI, no scleral icterus. Left facial droopiness  Cardiac: RRR, no rubs, murmurs or gallops Pulm: clear to auscultation bilaterally, no wheezes, rales, or rhonchi Abd: Obese, soft, nontender, nondistended, BS present Ext: warm and well perfused, no pedal edema. SCDs in place bilaterally  Neurologic Examination:  General: Pleasant. Somnolent but easily arousable Mental Status:  Alert, oriented, thought content appropriate. Speech fluent without evidence of aphasia. Mild dysarthria. Able to follow 3 step commands without difficulty.  Cranial Nerves:  II: Discs flat bilaterally; Visual fields grossly normal, pupils equal, round, reactive to light and accommodation  III,IV, VI: ptosis not present, extra-ocular motions intact bilaterally  V,VII: smile asymmetric with left facial droopiness, facial light touch sensation normal bilaterally  VIII: hearing normal bilaterally  IX,X: gag reflex present  XI: weak left shoulder shrug, normal right shoulder shrug XII:  midline tongue extension without atrophy or fasciculations  Motor:  Significant for left hemiparesis.  Tone diminished in the left side.  Sensory: Pinprick and light touch intact throughout, bilaterally  Deep Tendon Reflexes:  1 all over  Plantars:  Right: downgoing Left: downgoing  Cerebellar:  normal finger-to-nose and normal heel-to-shin test in the right. Can not perform in the left side due to weakness.  Gait: unable to test.    Lab Results: Basic Metabolic Panel:  Recent Labs Lab 01/18/14 1025 01/19/14 0452  NA 142 137  K 4.2 4.3  CL 103 100  CO2 27 18*  GLUCOSE 87 237*  BUN 12 14  CREATININE 0.88 0.78  CALCIUM 9.4 9.7    Liver Function Tests:  Recent Labs Lab 01/18/14 1025  AST 18  ALT 36  ALKPHOS 96  BILITOT 0.4  PROT 7.5  ALBUMIN 4.0   CBC:  Recent Labs Lab 01/18/14 1025 01/19/14 0452  WBC 6.9 15.0*  NEUTROABS 4.4  --   HGB 13.5 14.0  HCT 42.2 43.1  MCV 79.3 78.4  PLT 194 237     Microbiology: Results for orders placed during the hospital encounter of 01/28/11  URINE CULTURE     Status: None   Collection Time    01/28/11  8:43 AM      Result Value Ref Range Status   Specimen Description URINE, RANDOM   Final   Special Requests NONE IMMUNE:NORM UT SYMPT:NEG   Final   Culture  Setup Time [id number]   Final   Colony Count 15,000 COLONIES/ML   Final   Culture     Final  Value: Multiple bacterial morphotypes present, none predominant. Suggest appropriate recollection if clinically indicated.   Report Status 01/30/2011 FINAL   Final  MRSA PCR SCREENING     Status: None   Collection Time    01/28/11  7:24 PM      Result Value Ref Range Status   MRSA by PCR NEGATIVE  NEGATIVE Final   Comment:            The GeneXpert MRSA Assay (FDA     approved for NASAL specimens     only), is one component of a     comprehensive MRSA colonization     surveillance program. It is not     intended to diagnose MRSA     infection nor to guide or      monitor treatment for     MRSA infections.    Imaging: Ct Head Wo Contrast  01/18/2014   CLINICAL DATA:  Multiple falls. Non ambulatory with left frontal headaches. History of multiple sclerosis.  EXAM: CT HEAD WITHOUT CONTRAST  TECHNIQUE: Contiguous axial images were obtained from the base of the skull through the vertex without intravenous contrast.  COMPARISON:  Head CT 07/02/2011.  FINDINGS: The patient's head is tilted on the CT gantry.  There is no evidence of acute intracranial hemorrhage, mass lesion, brain edema or extra-axial fluid collection. The ventricles and subarachnoid spaces are prominent for age but stable. There is no CT evidence of acute cortical infarction. There is stable mild nonspecific periventricular and subcortical white matter disease.  The visualized paranasal sinuses, mastoid air cells and middle ears are clear. The calvarium is intact.  IMPRESSION: Stable atrophy and periventricular/subcortical white matter disease. No acute intracranial findings identified.   Electronically Signed   By: Roxy Horseman M.D.   On: 01/18/2014 11:32   Mr Laqueta Jean WU Contrast  01/19/2014   CLINICAL DATA:  Multiple scleroses versus PML.  EXAM: MRI HEAD WITHOUT AND WITH CONTRAST  MRI CERVICAL SPINE WITHOUT AND WITH CONTRAST  TECHNIQUE: Multiplanar, multiecho pulse sequences of the brain and surrounding structures, and cervical spine, to include the craniocervical junction and cervicothoracic junction, were obtained without and with intravenous contrast.  CONTRAST:  20 cc MultiHance  COMPARISON:  CT of the head January 18, 2014  FINDINGS: MRI HEAD FINDINGS  Large body habitus results in decreased overall signal to noise ratio.  Patchy reduced diffusion with corresponding low ADC values in the right posterior frontal and anterior parietal white matter. Confluent reduced diffusion with T2 shine through in right frontal, left temporal periventricular white matter. Lesion show corresponding bright T2 signal.  There are additional far greater than 10 supratentorial and at least 1 at left inferior cerebellar white matter lesion, somewhat confluent in the supratentorial brain, many of which radiate from the periventricular margin. Mild enhancement associated the right frontal subacute changes. Lesion show low T1 signal suggesting black holes of demyelination. No midline shift or mass effect. No susceptibility artifact to suggest hemorrhage.  Moderate ventriculomegaly, likely on the basis of global parenchymal brain volume loss as there is commensurate enlargement of cerebral sulci and cerebellar folia. No abnormal extra-axial fluid collections. Normal major intracranial vascular flow voids seen at the skull base. No abnormal leptomeningeal nor extra-axial masses.  Ocular globes and orbital contents are nonsuspicious though not tailored for evaluation and, optic neuritis could not be excluded on the basis of this examination. No paranasal sinus air-fluid levels. Mastoid air cells appear well-aerated. No abnormal sellar expansion. No cerebellar tonsillar  ectopia. No suspicious calvarial bone marrow signal.  MRI CERVICAL SPINE FINDINGS  Large body habitus resultant noisy image quality, in addition, this is a mild to moderately motion degraded examination. Multiple sequences re-attempted without image quality improvement.  Cervical vertebral bodies and posterior elements are intact and aligned with straightened cervical lordosis. Intervertebral discs demonstrate normal morphology and signal characteristics. No abnormal osseous or intradiscal enhancement.  Cervical spinal cord appears normal morphology and signal characteristics, motion degrades sensitivity for subtle parenchymal signal abnormality. No abnormal cord, leptomeningeal nor epidural enhancement. Included prevertebral and paraspinal soft tissues are nonsuspicious.  No significant disc bulge, canal stenosis or neural foraminal narrowing at any cervical level.   IMPRESSION: MRI of the brain: Probable hyperacute right frontoparietal demyelination a pattern suggesting tumefactive multiple sclerosis, in a background of acute to subacute supra and infratentorial demyelination. Minimal enhancement associated with the subacute changes in the right frontal lobe. Moderate parenchymal brain volume loss for age.  No definite findings of PML though, recommend correlation with immune status and history of Tsyabri medication.  MRI of the cervical spine: Habitus limited examination. No convincing evidence of demyelination within the cervical spinal cord though habitus and motion degrades sensitivity.  Straightened cervical lordosis without acute fracture, malalignment or neural compressive changes.   Electronically Signed   By: Awilda Metro   On: 01/19/2014 01:57   Mr Cervical Spine W Wo Contrast  01/19/2014   CLINICAL DATA:  Multiple scleroses versus PML.  EXAM: MRI HEAD WITHOUT AND WITH CONTRAST  MRI CERVICAL SPINE WITHOUT AND WITH CONTRAST  TECHNIQUE: Multiplanar, multiecho pulse sequences of the brain and surrounding structures, and cervical spine, to include the craniocervical junction and cervicothoracic junction, were obtained without and with intravenous contrast.  CONTRAST:  20 cc MultiHance  COMPARISON:  CT of the head January 18, 2014  FINDINGS: MRI HEAD FINDINGS  Large body habitus results in decreased overall signal to noise ratio.  Patchy reduced diffusion with corresponding low ADC values in the right posterior frontal and anterior parietal white matter. Confluent reduced diffusion with T2 shine through in right frontal, left temporal periventricular white matter. Lesion show corresponding bright T2 signal. There are additional far greater than 10 supratentorial and at least 1 at left inferior cerebellar white matter lesion, somewhat confluent in the supratentorial brain, many of which radiate from the periventricular margin. Mild enhancement associated the right  frontal subacute changes. Lesion show low T1 signal suggesting black holes of demyelination. No midline shift or mass effect. No susceptibility artifact to suggest hemorrhage.  Moderate ventriculomegaly, likely on the basis of global parenchymal brain volume loss as there is commensurate enlargement of cerebral sulci and cerebellar folia. No abnormal extra-axial fluid collections. Normal major intracranial vascular flow voids seen at the skull base. No abnormal leptomeningeal nor extra-axial masses.  Ocular globes and orbital contents are nonsuspicious though not tailored for evaluation and, optic neuritis could not be excluded on the basis of this examination. No paranasal sinus air-fluid levels. Mastoid air cells appear well-aerated. No abnormal sellar expansion. No cerebellar tonsillar ectopia. No suspicious calvarial bone marrow signal.  MRI CERVICAL SPINE FINDINGS  Large body habitus resultant noisy image quality, in addition, this is a mild to moderately motion degraded examination. Multiple sequences re-attempted without image quality improvement.  Cervical vertebral bodies and posterior elements are intact and aligned with straightened cervical lordosis. Intervertebral discs demonstrate normal morphology and signal characteristics. No abnormal osseous or intradiscal enhancement.  Cervical spinal cord appears normal morphology and signal characteristics,  motion degrades sensitivity for subtle parenchymal signal abnormality. No abnormal cord, leptomeningeal nor epidural enhancement. Included prevertebral and paraspinal soft tissues are nonsuspicious.  No significant disc bulge, canal stenosis or neural foraminal narrowing at any cervical level.  IMPRESSION: MRI of the brain: Probable hyperacute right frontoparietal demyelination a pattern suggesting tumefactive multiple sclerosis, in a background of acute to subacute supra and infratentorial demyelination. Minimal enhancement associated with the subacute  changes in the right frontal lobe. Moderate parenchymal brain volume loss for age.  No definite findings of PML though, recommend correlation with immune status and history of Tsyabri medication.  MRI of the cervical spine: Habitus limited examination. No convincing evidence of demyelination within the cervical spinal cord though habitus and motion degrades sensitivity.  Straightened cervical lordosis without acute fracture, malalignment or neural compressive changes.   Electronically Signed   By: Awilda Metro   On: 01/19/2014 01:57    Medications: I have reviewed the patient's current medications.  Assessment/Plan: 30 year man with PMH of RR-MS since 2012, comes in with left hemiparesis, left sided numbness and left face weakness since approximately 01/03/14. Has been doing well on Dimethyl fumarate (Tecfidera) until this presentation.  Serologies including WBC and lymphocyte count initially unremarkable with no recent fever or infection. He has a leucocytosis today which is likely secondary to steroid therapy. Remains afebrile with no cough/diarrhea/urinary symptoms.  Most likely MS relapse. Dimethyl fumarate associated PML with and without associated severe lymphopenia have been recently described and therefore must be ruled out.  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   Recommend:  1)  IV solumedrol 1 gram daily 3-5 days depending on symptom improvement.  2) Continue Tecfidera.  3) PT  4) Speech eval, NPO for now Will continue following. Dr. Leroy Kennedy to see  Leonia Reeves, MD PGY-3 IMTS Working with [facility] [phone]  01/19/2014, 11:53 AM

## 2014-01-19 NOTE — Progress Notes (Signed)
Noted that lab blood glucose was 237 mg/dl after taking steroids.  No history of DM noted.  Recommend checking CBGs TID & HS to see how blood sugars range.  May need to start Novolog MODERATE correction scale TID & HS if CBGs greater than 180 mg/dl.  Will follow while in hospital.  Smith Mince RN BSN CDE

## 2014-01-20 DIAGNOSIS — R269 Unspecified abnormalities of gait and mobility: Secondary | ICD-10-CM

## 2014-01-20 DIAGNOSIS — G35 Multiple sclerosis: Secondary | ICD-10-CM

## 2014-01-20 LAB — HEMOGLOBIN A1C
Hgb A1c MFr Bld: 6.1 % — ABNORMAL HIGH (ref <5.7)
Mean Plasma Glucose: 128 mg/dL — ABNORMAL HIGH (ref <117)

## 2014-01-20 LAB — CBC
HCT: 44.1 % (ref 39.0–52.0)
Hemoglobin: 13.8 g/dL (ref 13.0–17.0)
MCH: 25.5 pg — ABNORMAL LOW (ref 26.0–34.0)
MCHC: 31.3 g/dL (ref 30.0–36.0)
MCV: 81.5 fL (ref 78.0–100.0)
Platelets: 199 10*3/uL (ref 150–400)
RBC: 5.41 MIL/uL (ref 4.22–5.81)
RDW: 14.8 % (ref 11.5–15.5)
WBC: 16.5 10*3/uL — ABNORMAL HIGH (ref 4.0–10.5)

## 2014-01-20 LAB — GLUCOSE, CAPILLARY
Glucose-Capillary: 174 mg/dL — ABNORMAL HIGH (ref 70–99)
Glucose-Capillary: 253 mg/dL — ABNORMAL HIGH (ref 70–99)
Glucose-Capillary: 278 mg/dL — ABNORMAL HIGH (ref 70–99)
Glucose-Capillary: 266 mg/dL — ABNORMAL HIGH (ref 70–99)

## 2014-01-20 MED ORDER — INSULIN ASPART 100 UNIT/ML ~~LOC~~ SOLN
0.0000 [IU] | Freq: Three times a day (TID) | SUBCUTANEOUS | Status: DC
  Administered 2014-01-20 (×2): 8 [IU] via SUBCUTANEOUS
  Administered 2014-01-20: 3 [IU] via SUBCUTANEOUS
  Administered 2014-01-21: 11 [IU] via SUBCUTANEOUS
  Administered 2014-01-21 – 2014-01-22 (×3): 5 [IU] via SUBCUTANEOUS
  Administered 2014-01-22: 8 [IU] via SUBCUTANEOUS
  Administered 2014-01-22: 5 [IU] via SUBCUTANEOUS
  Administered 2014-01-23 (×2): 3 [IU] via SUBCUTANEOUS
  Administered 2014-01-23: 8 [IU] via SUBCUTANEOUS

## 2014-01-20 MED ORDER — DEXTROMETHORPHAN-QUINIDINE 20-10 MG PO CAPS
1.0000 | ORAL_CAPSULE | Freq: Two times a day (BID) | ORAL | Status: DC
  Administered 2014-01-20 – 2014-01-22 (×4): 1 via ORAL
  Administered 2014-01-22 – 2014-01-23 (×2): via ORAL
  Filled 2014-01-20: qty 1

## 2014-01-20 MED ORDER — FLEET ENEMA 7-19 GM/118ML RE ENEM
1.0000 | ENEMA | Freq: Once | RECTAL | Status: AC
  Administered 2014-01-20: 1 via RECTAL

## 2014-01-20 NOTE — Progress Notes (Signed)
[facility]                                                                              Patient Demographics  Brandon [name], is a 23 y.o. male, DOB - 22-Nov-1990, RUE:[id number]  Admit date - 01/18/2014   Admitting Physician Joseph Art, DO  Outpatient Primary MD for the patient is OSEI-BONSU,GEORGE, MD  LOS - 2   Chief Complaint  Patient presents with  . Fall  . Multiple Sclerosis      HPI 01/18/2014 Brandon [name] is a 23 y.o. Male with suspected MS. Per family at bedside and mother on the phone, he came in with increasing left leg weakness and multiple falls. Patient usually uses a walker but it has been broken. Mother stated that her son has fallen several times.  Family originally was able to get him up and back to bed, but the last time he fell they were unable to get him up. They called for an ambulance. The patient fell again after EMS got him up so they took him to the emergency room.  Upon admission, patient denied fever, chills, shortness of breath, chest pain.  He follows with Dr. Anne Hahn (neurology) and was last seen on 12/12/2013. In the ER, his head CT was unremarkable. Patient was seen by neurology and MRI was ordered along with steroids for possible MS exacerbation. Labs were remarkably normal   Assessment & Plan   Left-sided weakness/possible multiple sclerosis exacerbation -Continue Tecfidera, possibly noncompliant.  He was on Tysabri.   -MRI: Probable hyperacute right frontoparietal demyelination a pattern suggesting tumefactive multiple sclerosis -Neurology consulted, following, and appreciated- recommended Solumedrol 1g for 5 days, Day 3 -PT consulted and recommended CIR.  Also recommended wheelchair/cushion and hospital bed and hoyer lift.  -OT and speech consulted -CIR consulted  Obesity, BMI 51.7 -Encourage weight loss, diet modifications -Patient should discuss this with his primary care physician  Leukocytosis -Likely secondary to  steroids -UA negative for infection, patient does not have cough or shortness of breath, and is afebrile  Bipolar disorder, schizophrenia, ADHD -Continue Cogentin, Wellbutrin, lithium, loxapine, Seroquel, Trilafon, Dimethyl fumarate  Code Status: Full  Family Communication: None at bedside  Disposition Plan: Admitted.  Giving an additional 2 doses of solumedrol, ?discharge on 01/23/2014. CIR consult pending.  Time Spent in minutes   25 minutes  Procedures  None  Consults   Neurology CIR   DVT Prophylaxis  Heparin   Lab Results  Component Value Date   PLT 199 01/20/2014    Medications  Scheduled Meds: . benztropine  1 mg Oral QHS  . buPROPion  300 mg Oral Daily  . cloNIDine HCl  0.1 mg Oral QHS  . Dextromethorphan-Quinidine  1 capsule Oral BID  . Dimethyl Fumarate  240 mg Oral BID  . enoxaparin (LOVENOX) injection  90 mg Subcutaneous Q24H  . insulin aspart  0-15 Units Subcutaneous TID WC  . lithium carbonate  450 mg Oral BID  . methylPREDNISolone (SOLU-MEDROL) injection  1,000 mg Intravenous Daily  . OLANZapine Pamoate  300 mg Intramuscular Q14 Days  . perphenazine  4 mg Oral BID  . QUEtiapine  400 mg Oral QHS  Continuous Infusions:  PRN Meds:.acetaminophen, acetaminophen  Antibiotics    Anti-infectives   None      Subjective:   Brandon [name] seen and examined today.  Patient very sleepy this morning and difficult to arouse.  He mumbles.  He continues to have weakness, however cannot explain or elaborate.   Objective:   Filed Vitals:   01/19/14 2121 01/20/14 0249 01/20/14 0550 01/20/14 0934  BP: 154/96 140/88 153/86 133/70  Pulse: 76 62 68 77  Temp: 97.9 F (36.6 C) 97.3 F (36.3 C) 98.2 F (36.8 C) 97.5 F (36.4 C)  TempSrc: Oral Oral Oral Oral  Resp: 18 16 18 18   Height:      Weight:      SpO2: 100% 97% 100% 100%    Wt Readings from Last 3 Encounters:  01/18/14 177.3 kg (390 lb 14 oz)  12/12/13 176.903 kg (390 lb)  11/18/13 176.449 kg  (389 lb)     Intake/Output Summary (Last 24 hours) at 01/20/14 0949 Last data filed at 01/20/14 0500  Gross per 24 hour  Intake      0 ml  Output    850 ml  Net   -850 ml    Exam  General: Well developed, well nourished, NAD  HEENT: NCAT, mucous membranes moist.   Neck: Supple, no JVD, no masses  Cardiovascular: S1 S2 auscultated, no rubs, murmurs or gallops. Regular rate and rhythm.  Respiratory: Clear to auscultation bilaterally with equal chest rise  Abdomen: Soft, Obese, nontender, nondistended, + bowel sounds  Extremities: warm dry without cyanosis clubbing or edema  Neuro: Awake, not really alert this morning. Very sleepy.   Skin: Without rashes exudates or nodules  Data Review   Micro Results No results found for this or any previous visit (from the past 240 hour(s)).  Radiology Reports Ct Head Wo Contrast  01/18/2014   CLINICAL DATA:  Multiple falls. Non ambulatory with left frontal headaches. History of multiple sclerosis.  EXAM: CT HEAD WITHOUT CONTRAST  TECHNIQUE: Contiguous axial images were obtained from the base of the skull through the vertex without intravenous contrast.  COMPARISON:  Head CT 07/02/2011.  FINDINGS: The patient's head is tilted on the CT gantry.  There is no evidence of acute intracranial hemorrhage, mass lesion, brain edema or extra-axial fluid collection. The ventricles and subarachnoid spaces are prominent for age but stable. There is no CT evidence of acute cortical infarction. There is stable mild nonspecific periventricular and subcortical white matter disease.  The visualized paranasal sinuses, mastoid air cells and middle ears are clear. The calvarium is intact.  IMPRESSION: Stable atrophy and periventricular/subcortical white matter disease. No acute intracranial findings identified.   Electronically Signed   By: Roxy HorsemanBill  Veazey M.D.   On: 01/18/2014 11:32   Mr Laqueta JeanBrain W ZOWo Contrast  01/19/2014   CLINICAL DATA:  Multiple scleroses versus  PML.  EXAM: MRI HEAD WITHOUT AND WITH CONTRAST  MRI CERVICAL SPINE WITHOUT AND WITH CONTRAST  TECHNIQUE: Multiplanar, multiecho pulse sequences of the brain and surrounding structures, and cervical spine, to include the craniocervical junction and cervicothoracic junction, were obtained without and with intravenous contrast.  CONTRAST:  20 cc MultiHance  COMPARISON:  CT of the head January 18, 2014  FINDINGS: MRI HEAD FINDINGS  Large body habitus results in decreased overall signal to noise ratio.  Patchy reduced diffusion with corresponding low ADC values in the right posterior frontal and anterior parietal white matter. Confluent reduced diffusion with T2 shine through in right  frontal, left temporal periventricular white matter. Lesion show corresponding bright T2 signal. There are additional far greater than 10 supratentorial and at least 1 at left inferior cerebellar white matter lesion, somewhat confluent in the supratentorial brain, many of which radiate from the periventricular margin. Mild enhancement associated the right frontal subacute changes. Lesion show low T1 signal suggesting black holes of demyelination. No midline shift or mass effect. No susceptibility artifact to suggest hemorrhage.  Moderate ventriculomegaly, likely on the basis of global parenchymal brain volume loss as there is commensurate enlargement of cerebral sulci and cerebellar folia. No abnormal extra-axial fluid collections. Normal major intracranial vascular flow voids seen at the skull base. No abnormal leptomeningeal nor extra-axial masses.  Ocular globes and orbital contents are nonsuspicious though not tailored for evaluation and, optic neuritis could not be excluded on the basis of this examination. No paranasal sinus air-fluid levels. Mastoid air cells appear well-aerated. No abnormal sellar expansion. No cerebellar tonsillar ectopia. No suspicious calvarial bone marrow signal.  MRI CERVICAL SPINE FINDINGS  Large body habitus  resultant noisy image quality, in addition, this is a mild to moderately motion degraded examination. Multiple sequences re-attempted without image quality improvement.  Cervical vertebral bodies and posterior elements are intact and aligned with straightened cervical lordosis. Intervertebral discs demonstrate normal morphology and signal characteristics. No abnormal osseous or intradiscal enhancement.  Cervical spinal cord appears normal morphology and signal characteristics, motion degrades sensitivity for subtle parenchymal signal abnormality. No abnormal cord, leptomeningeal nor epidural enhancement. Included prevertebral and paraspinal soft tissues are nonsuspicious.  No significant disc bulge, canal stenosis or neural foraminal narrowing at any cervical level.  IMPRESSION: MRI of the brain: Probable hyperacute right frontoparietal demyelination a pattern suggesting tumefactive multiple sclerosis, in a background of acute to subacute supra and infratentorial demyelination. Minimal enhancement associated with the subacute changes in the right frontal lobe. Moderate parenchymal brain volume loss for age.  No definite findings of PML though, recommend correlation with immune status and history of Tsyabri medication.  MRI of the cervical spine: Habitus limited examination. No convincing evidence of demyelination within the cervical spinal cord though habitus and motion degrades sensitivity.  Straightened cervical lordosis without acute fracture, malalignment or neural compressive changes.   Electronically Signed   By: Awilda Metro   On: 01/19/2014 01:57   Mr Cervical Spine W Wo Contrast  01/19/2014   CLINICAL DATA:  Multiple scleroses versus PML.  EXAM: MRI HEAD WITHOUT AND WITH CONTRAST  MRI CERVICAL SPINE WITHOUT AND WITH CONTRAST  TECHNIQUE: Multiplanar, multiecho pulse sequences of the brain and surrounding structures, and cervical spine, to include the craniocervical junction and cervicothoracic  junction, were obtained without and with intravenous contrast.  CONTRAST:  20 cc MultiHance  COMPARISON:  CT of the head January 18, 2014  FINDINGS: MRI HEAD FINDINGS  Large body habitus results in decreased overall signal to noise ratio.  Patchy reduced diffusion with corresponding low ADC values in the right posterior frontal and anterior parietal white matter. Confluent reduced diffusion with T2 shine through in right frontal, left temporal periventricular white matter. Lesion show corresponding bright T2 signal. There are additional far greater than 10 supratentorial and at least 1 at left inferior cerebellar white matter lesion, somewhat confluent in the supratentorial brain, many of which radiate from the periventricular margin. Mild enhancement associated the right frontal subacute changes. Lesion show low T1 signal suggesting black holes of demyelination. No midline shift or mass effect. No susceptibility artifact to suggest hemorrhage.  Moderate  ventriculomegaly, likely on the basis of global parenchymal brain volume loss as there is commensurate enlargement of cerebral sulci and cerebellar folia. No abnormal extra-axial fluid collections. Normal major intracranial vascular flow voids seen at the skull base. No abnormal leptomeningeal nor extra-axial masses.  Ocular globes and orbital contents are nonsuspicious though not tailored for evaluation and, optic neuritis could not be excluded on the basis of this examination. No paranasal sinus air-fluid levels. Mastoid air cells appear well-aerated. No abnormal sellar expansion. No cerebellar tonsillar ectopia. No suspicious calvarial bone marrow signal.  MRI CERVICAL SPINE FINDINGS  Large body habitus resultant noisy image quality, in addition, this is a mild to moderately motion degraded examination. Multiple sequences re-attempted without image quality improvement.  Cervical vertebral bodies and posterior elements are intact and aligned with straightened  cervical lordosis. Intervertebral discs demonstrate normal morphology and signal characteristics. No abnormal osseous or intradiscal enhancement.  Cervical spinal cord appears normal morphology and signal characteristics, motion degrades sensitivity for subtle parenchymal signal abnormality. No abnormal cord, leptomeningeal nor epidural enhancement. Included prevertebral and paraspinal soft tissues are nonsuspicious.  No significant disc bulge, canal stenosis or neural foraminal narrowing at any cervical level.  IMPRESSION: MRI of the brain: Probable hyperacute right frontoparietal demyelination a pattern suggesting tumefactive multiple sclerosis, in a background of acute to subacute supra and infratentorial demyelination. Minimal enhancement associated with the subacute changes in the right frontal lobe. Moderate parenchymal brain volume loss for age.  No definite findings of PML though, recommend correlation with immune status and history of Tsyabri medication.  MRI of the cervical spine: Habitus limited examination. No convincing evidence of demyelination within the cervical spinal cord though habitus and motion degrades sensitivity.  Straightened cervical lordosis without acute fracture, malalignment or neural compressive changes.   Electronically Signed   By: Awilda Metro   On: 01/19/2014 01:57    CBC  Recent Labs Lab 01/18/14 1025 01/19/14 0452 01/20/14 0615  WBC 6.9 15.0* 16.5*  HGB 13.5 14.0 13.8  HCT 42.2 43.1 44.1  PLT 194 237 199  MCV 79.3 78.4 81.5  MCH 25.4* 25.5* 25.5*  MCHC 32.0 32.5 31.3  RDW 14.5 14.1 14.8  LYMPHSABS 1.7  --   --   MONOABS 0.4  --   --   EOSABS 0.3  --   --   BASOSABS 0.0  --   --     Chemistries   Recent Labs Lab 01/18/14 1025 01/19/14 0452  NA 142 137  K 4.2 4.3  CL 103 100  CO2 27 18*  GLUCOSE 87 237*  BUN 12 14  CREATININE 0.88 0.78  CALCIUM 9.4 9.7  AST 18  --   ALT 36  --   ALKPHOS 96  --   BILITOT 0.4  --     ------------------------------------------------------------------------------------------------------------------ estimated creatinine clearance is 245.6 ml/min (by C-G formula based on Cr of 0.78). ------------------------------------------------------------------------------------------------------------------ No results found for this basename: HGBA1C,  in the last 72 hours ------------------------------------------------------------------------------------------------------------------ No results found for this basename: CHOL, HDL, LDLCALC, TRIG, CHOLHDL, LDLDIRECT,  in the last 72 hours ------------------------------------------------------------------------------------------------------------------  Recent Labs  01/18/14 2015  TSH 0.848   ------------------------------------------------------------------------------------------------------------------ No results found for this basename: VITAMINB12, FOLATE, FERRITIN, TIBC, IRON, RETICCTPCT,  in the last 72 hours  Coagulation profile No results found for this basename: INR, PROTIME,  in the last 168 hours  No results found for this basename: DDIMER,  in the last 72 hours  Cardiac Enzymes No results found for this basename:  CK, CKMB, TROPONINI, MYOGLOBIN,  in the last 168 hours ------------------------------------------------------------------------------------------------------------------ No components found with this basename: POCBNP,     Lamin Chandley D.O. on 01/20/2014 at 9:49 AM  Between 7am to 7pm - Pager - [phone]  After 7pm go to [email or web address] - password [email or web address]  And look for the night coverage person covering for me after hours  [facility] Office  [phone]

## 2014-01-20 NOTE — Consult Note (Signed)
Physical Medicine and Rehabilitation Consult  Reason for Consult: MS exacerbation with left sided weakness and falls Referring Physician: Dr. Catha Gosselin   HPI: Brandon [name] is a 23 y.o. male with history of morbid obesity, Schizophrenia, MS, who was admitted on 01/18/14 with falls and inability to walk. Family reported problems with gait disorder with left lean, left sided weakness and left facial droop for past couple of weeks as well as walker that was broken. Neurology consulted and recommended IV solumedrol for likely MS exacerbation as well as work up to rule out other causes. MRI brain with hyperacute right frontoparietal  demyelination a pattern suggesting tumefactive multiple sclerosis, in a background of acute to subacute supra and infratentorial demyelination as well as moderate parenchymal brain volume loss for age. PT evaluation initiated and patient limited by Left sided weakness, lethargy as well as cognitive deficits. MD, PT recommending CIR    Review of Systems  Unable to perform ROS: other    Past Medical History  Diagnosis Date  . ADHD (attention deficit hyperactivity disorder)   . Hypertension   . Schizophrenia   . Bipolar 1 disorder   . 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 Obesity   . Multiple sclerosis 05/20/2013   Past Surgical History  Procedure Laterality Date  . None     Family History  Problem Relation Age of Onset  . Diabetes Mother   . ADD / ADHD Brother    Social History:   Lives with mother (uses electric WC  and 25 year old brother). Per  reports that he has been smoking Cigarettes.  He has been smoking about 0.25 packs per day. He has never used smokeless tobacco. Per reports that he uses illicit drugs (Marijuana). Per reports that he does not drink alcohol.  Allergies: No Known Allergies  Medications Prior to Admission  Medication Sig Dispense Refill  . amLODipine (NORVASC) 5 MG tablet Take 5 mg by mouth 2 (two) times  daily.      . benztropine (COGENTIN) 1 MG tablet Take 1 mg by mouth at bedtime.       Marland Kitchen buPROPion (WELLBUTRIN XL) 150 MG 24 hr tablet Take 300 mg by mouth daily.       . cloNIDine HCl (KAPVAY) 0.1 MG TB12 ER tablet Take 0.1 mg by mouth at bedtime.      Marland Kitchen Dextromethorphan-Quinidine (NUEDEXTA) 20-10 MG CAPS Take 1 capsule by mouth 2 (two) times daily. 8am and 8pm      . Dimethyl Fumarate 240 MG CPDR Take 240 mg by mouth 2 (two) times daily. 8am and 8pm      . lithium carbonate (ESKALITH) 450 MG CR tablet Take 450 mg by mouth 2 (two) times daily.      Marland Kitchen OLANZapine Pamoate (ZYPREXA RELPREVV) 300 MG SUSR Inject 300 mg into the muscle every 14 (fourteen) days.      Marland Kitchen perphenazine (TRILAFON) 4 MG tablet Take 4 mg by mouth 2 (two) times daily.      . QUEtiapine (SEROQUEL XR) 400 MG 24 hr tablet Take 400 mg by mouth at bedtime.        Home: Home Living Family/patient expects to be discharged to:: Private residence Living Arrangements: Parent (and brother (17 years), mother is present in an Mining engineer WC) Available Help at Discharge: Available 24 hours/day Type of Home: House Home Access: Stairs to enter [employer] of Steps: 1 Entrance Stairs-Rails: None Home Layout: One level Home  Equipment: Dan HumphreysWalker - 2 wheels (Rollator)  Functional History: Prior Function Level of Independence: Independent with assistive device(s) Comments: Used rollator before Functional Status:  Mobility: Bed Mobility Overal bed mobility: +2 for physical assistance;Needs Assistance Bed Mobility: Supine to Sit;Sit to Supine Supine to sit: +2 for physical assistance;Mod assist Sit to supine: +2 for physical assistance;Max assist General bed mobility comments: Two person mod assist to get to the right side of the bed.  Pt using momentum to throw his weak side over the side of the bed and was able to with assistance to get bil legs all the way over and some assistance of his trunk come to sitting easier than  anticipated.  Mod assis to support trunk and legs during transition overall.  There was less help from pt and more falling back with transition back to bed, so max assist of both trunk and bil legs to get back to supine from sitting.  Transfers Overall transfer level: Needs assistance Equipment used: None Transfers: Sit to/from Stand Sit to Stand: +2 physical assistance;Total assist General transfer comment: Pt was unable with hand on bed rail and elevated bed to come to standing.  He was unable with two person assist and left knee blocked to even clear or lift his buttocks off of the bed.  He tried x2, but was not strong enough.  Ambulation/Gait General Gait Details: Unable at this time.     ADL:    Cognition: Cognition Overall Cognitive Status: Impaired/Different from baseline Orientation Level: Oriented to person Cognition Arousal/Alertness: Lethargic Behavior During Therapy: Flat affect (Very lethargic) Overall Cognitive Status: Impaired/Different from baseline Area of Impairment: Attention;Following commands;Awareness;Problem solving Current Attention Level: Focused Memory: Decreased short-term memory Following Commands: Follows one step commands inconsistently;Follows one step commands with increased time Awareness: Intellectual Problem Solving: Slow processing;Decreased initiation;Difficulty sequencing;Requires tactile cues;Requires verbal cues General Comments: Pt with interesting cognitive presentation today.  He seems to have decreased attention, at time responding right away to questions, at other times seeming to lose his focus on what was aske or said vs slow processing.    Blood pressure 153/86, pulse 68, temperature 98.2 F (36.8 C), temperature source Oral, resp. rate 18, height 6\' 1"  (1.854 m), weight 177.3 kg (390 lb 14 oz), SpO2 100.00%. Physical Exam  Nursing note and vitals reviewed. Constitutional: Vital signs are normal. He appears well-developed.  Morbidly  obese male. Sleeping soundly and barely aroused with sternal rubs.   HENT:  Head: Normocephalic and atraumatic.  Cardiovascular: Normal rate and regular rhythm.   No murmur heard. Respiratory: Effort normal and breath sounds normal. No respiratory distress. He has no wheezes.  GI: Soft. Bowel sounds are normal. He exhibits no distension. There is no tenderness.  Musculoskeletal: He exhibits no edema.  Neurological:  Barely opened eyes to sternal rubs. Was able to state name, place and situation as "weakenss". Mumbling with slurred speech. Left sided weakness noted but exam limited by lethargy. Was attempting to feed himself using right hand. Does initiate movement more with the right side. Was able to move both the left arm and leg slightly for me today. Pt brightened up a bit the more we talked but never was completely engaged.   Skin: Skin is warm and dry.    Results for orders placed during the hospital encounter of 01/18/14 (from the past 24 hour(s))  CBC     Status: Abnormal   Collection Time    01/20/14  6:15 AM      Result  Value Ref Range   WBC 16.5 (*) 4.0 - 10.5 K/uL   RBC 5.41  4.22 - 5.81 MIL/uL   Hemoglobin 13.8  13.0 - 17.0 g/dL   HCT 81.1  91.4 - 78.2 %   MCV 81.5  78.0 - 100.0 fL   MCH 25.5 (*) 26.0 - 34.0 pg   MCHC 31.3  30.0 - 36.0 g/dL   RDW 95.6  21.3 - 08.6 %   Platelets 199  150 - 400 K/uL  GLUCOSE, CAPILLARY     Status: Abnormal   Collection Time    01/20/14  7:44 AM      Result Value Ref Range   Glucose-Capillary 174 (*) 70 - 99 mg/dL   Ct Head Wo Contrast  01/18/2014   CLINICAL DATA:  Multiple falls. Non ambulatory with left frontal headaches. History of multiple sclerosis.  EXAM: CT HEAD WITHOUT CONTRAST  TECHNIQUE: Contiguous axial images were obtained from the base of the skull through the vertex without intravenous contrast.  COMPARISON:  Head CT 07/02/2011.  FINDINGS: The patient's head is tilted on the CT gantry.  There is no evidence of acute  intracranial hemorrhage, mass lesion, brain edema or extra-axial fluid collection. The ventricles and subarachnoid spaces are prominent for age but stable. There is no CT evidence of acute cortical infarction. There is stable mild nonspecific periventricular and subcortical white matter disease.  The visualized paranasal sinuses, mastoid air cells and middle ears are clear. The calvarium is intact.  IMPRESSION: Stable atrophy and periventricular/subcortical white matter disease. No acute intracranial findings identified.   Electronically Signed   By: Roxy Horseman M.D.   On: 01/18/2014 11:32   Mr Laqueta Jean VH Contrast  01/19/2014   CLINICAL DATA:  Multiple scleroses versus PML.  EXAM: MRI HEAD WITHOUT AND WITH CONTRAST  MRI CERVICAL SPINE WITHOUT AND WITH CONTRAST  TECHNIQUE: Multiplanar, multiecho pulse sequences of the brain and surrounding structures, and cervical spine, to include the craniocervical junction and cervicothoracic junction, were obtained without and with intravenous contrast.  CONTRAST:  20 cc MultiHance  COMPARISON:  CT of the head January 18, 2014  FINDINGS: MRI HEAD FINDINGS  Large body habitus results in decreased overall signal to noise ratio.  Patchy reduced diffusion with corresponding low ADC values in the right posterior frontal and anterior parietal white matter. Confluent reduced diffusion with T2 shine through in right frontal, left temporal periventricular white matter. Lesion show corresponding bright T2 signal. There are additional far greater than 10 supratentorial and at least 1 at left inferior cerebellar white matter lesion, somewhat confluent in the supratentorial brain, many of which radiate from the periventricular margin. Mild enhancement associated the right frontal subacute changes. Lesion show low T1 signal suggesting black holes of demyelination. No midline shift or mass effect. No susceptibility artifact to suggest hemorrhage.  Moderate ventriculomegaly, likely on the basis  of global parenchymal brain volume loss as there is commensurate enlargement of cerebral sulci and cerebellar folia. No abnormal extra-axial fluid collections. Normal major intracranial vascular flow voids seen at the skull base. No abnormal leptomeningeal nor extra-axial masses.  Ocular globes and orbital contents are nonsuspicious though not tailored for evaluation and, optic neuritis could not be excluded on the basis of this examination. No paranasal sinus air-fluid levels. Mastoid air cells appear well-aerated. No abnormal sellar expansion. No cerebellar tonsillar ectopia. No suspicious calvarial bone marrow signal.  MRI CERVICAL SPINE FINDINGS  Large body habitus resultant noisy image quality, in addition, this is a  mild to moderately motion degraded examination. Multiple sequences re-attempted without image quality improvement.  Cervical vertebral bodies and posterior elements are intact and aligned with straightened cervical lordosis. Intervertebral discs demonstrate normal morphology and signal characteristics. No abnormal osseous or intradiscal enhancement.  Cervical spinal cord appears normal morphology and signal characteristics, motion degrades sensitivity for subtle parenchymal signal abnormality. No abnormal cord, leptomeningeal nor epidural enhancement. Included prevertebral and paraspinal soft tissues are nonsuspicious.  No significant disc bulge, canal stenosis or neural foraminal narrowing at any cervical level.  IMPRESSION: MRI of the brain: Probable hyperacute right frontoparietal demyelination a pattern suggesting tumefactive multiple sclerosis, in a background of acute to subacute supra and infratentorial demyelination. Minimal enhancement associated with the subacute changes in the right frontal lobe. Moderate parenchymal brain volume loss for age.  No definite findings of PML though, recommend correlation with immune status and history of Tsyabri medication.  MRI of the cervical spine:  Habitus limited examination. No convincing evidence of demyelination within the cervical spinal cord though habitus and motion degrades sensitivity.  Straightened cervical lordosis without acute fracture, malalignment or neural compressive changes.   Electronically Signed   By: Awilda Metro   On: 01/19/2014 01:57   Mr Cervical Spine W Wo Contrast  01/19/2014   CLINICAL DATA:  Multiple scleroses versus PML.  EXAM: MRI HEAD WITHOUT AND WITH CONTRAST  MRI CERVICAL SPINE WITHOUT AND WITH CONTRAST  TECHNIQUE: Multiplanar, multiecho pulse sequences of the brain and surrounding structures, and cervical spine, to include the craniocervical junction and cervicothoracic junction, were obtained without and with intravenous contrast.  CONTRAST:  20 cc MultiHance  COMPARISON:  CT of the head January 18, 2014  FINDINGS: MRI HEAD FINDINGS  Large body habitus results in decreased overall signal to noise ratio.  Patchy reduced diffusion with corresponding low ADC values in the right posterior frontal and anterior parietal white matter. Confluent reduced diffusion with T2 shine through in right frontal, left temporal periventricular white matter. Lesion show corresponding bright T2 signal. There are additional far greater than 10 supratentorial and at least 1 at left inferior cerebellar white matter lesion, somewhat confluent in the supratentorial brain, many of which radiate from the periventricular margin. Mild enhancement associated the right frontal subacute changes. Lesion show low T1 signal suggesting black holes of demyelination. No midline shift or mass effect. No susceptibility artifact to suggest hemorrhage.  Moderate ventriculomegaly, likely on the basis of global parenchymal brain volume loss as there is commensurate enlargement of cerebral sulci and cerebellar folia. No abnormal extra-axial fluid collections. Normal major intracranial vascular flow voids seen at the skull base. No abnormal leptomeningeal nor  extra-axial masses.  Ocular globes and orbital contents are nonsuspicious though not tailored for evaluation and, optic neuritis could not be excluded on the basis of this examination. No paranasal sinus air-fluid levels. Mastoid air cells appear well-aerated. No abnormal sellar expansion. No cerebellar tonsillar ectopia. No suspicious calvarial bone marrow signal.  MRI CERVICAL SPINE FINDINGS  Large body habitus resultant noisy image quality, in addition, this is a mild to moderately motion degraded examination. Multiple sequences re-attempted without image quality improvement.  Cervical vertebral bodies and posterior elements are intact and aligned with straightened cervical lordosis. Intervertebral discs demonstrate normal morphology and signal characteristics. No abnormal osseous or intradiscal enhancement.  Cervical spinal cord appears normal morphology and signal characteristics, motion degrades sensitivity for subtle parenchymal signal abnormality. No abnormal cord, leptomeningeal nor epidural enhancement. Included prevertebral and paraspinal soft tissues are nonsuspicious.  No  significant disc bulge, canal stenosis or neural foraminal narrowing at any cervical level.  IMPRESSION: MRI of the brain: Probable hyperacute right frontoparietal demyelination a pattern suggesting tumefactive multiple sclerosis, in a background of acute to subacute supra and infratentorial demyelination. Minimal enhancement associated with the subacute changes in the right frontal lobe. Moderate parenchymal brain volume loss for age.  No definite findings of PML though, recommend correlation with immune status and history of Tsyabri medication.  MRI of the cervical spine: Habitus limited examination. No convincing evidence of demyelination within the cervical spinal cord though habitus and motion degrades sensitivity.  Straightened cervical lordosis without acute fracture, malalignment or neural compressive changes.   Electronically  Signed   By: Awilda Metro   On: 01/19/2014 01:57    Assessment/Plan: Diagnosis: MS exacerbation 1. Does the need for close, 24 hr/day medical supervision in concert with the patient's rehab needs make it unreasonable for this patient to be served in a less intensive setting? Yes and Potentially 2. Co-Morbidities requiring supervision/potential complications: morbid obesity, schizoaffective disorder, htn 3. Due to bladder management, bowel management, safety, skin/wound care, disease management, medication administration, pain management and patient education, does the patient require 24 hr/day rehab nursing? Yes 4. Does the patient require coordinated care of a physician, rehab nurse, PT (1-2 hrs/day, 5 days/week), OT (1-2 hrs/day, 5 days/week) and SLP (1-2 hrs/day, 5 days/week) to address physical and functional deficits in the context of the above medical diagnosis(es)? Yes Addressing deficits in the following areas: balance, endurance, locomotion, strength, transferring, bowel/bladder control, bathing, dressing, feeding, grooming, toileting, cognition, speech, language and psychosocial support 5. Can the patient actively participate in an intensive therapy program of at least 3 hrs of therapy per day at least 5 days per week? Yes and Potentially 6. The potential for patient to make measurable gains while on inpatient rehab is good 7. Anticipated functional outcomes upon discharge from inpatient rehab are supervision and min assist  with PT, supervision and min assist with OT, supervision and min assist with SLP. 8. Estimated rehab length of stay to reach the above functional goals is: 20-25 days 9. Does the patient have adequate social supports to accommodate these discharge functional goals? Potentially 10. Anticipated D/C setting: Home 11. Anticipated post D/C treatments: HH therapy and Outpatient therapy 12. Overall Rehab/Functional Prognosis: good  RECOMMENDATIONS: This patient's  condition is appropriate for continued rehabilitative care in the following setting: CIR Patient has agreed to participate in recommended program. Potentially Note that insurance prior authorization may be required for reimbursement for recommended care.  Comment: Rehab Admissions Coordinator to follow up. Await better activity tolerance.  Thanks,  Ranelle Oyster, MD, [place] [facility]     01/20/2014

## 2014-01-20 NOTE — Progress Notes (Signed)
Subjective: No events overnight. States that he is "feeling better". He reports being stronger in his left leg and left arm.   Objective: Current vital signs: BP 133/70  Pulse 77  Temp(Src) 97.5 F (36.4 C) (Oral)  Resp 18  Ht 6\' 1"  (1.854 m)  Wt 390 lb 14 oz (177.3 kg)  BMI 51.58 kg/m2  SpO2 100% Vital signs in last 24 hours: Temp:  [97.3 F (36.3 C)-98.3 F (36.8 C)] 97.5 F (36.4 C) (07/17 0934) Pulse Rate:  [62-77] 77 (07/17 0934) Resp:  [16-18] 18 (07/17 0934) BP: (133-154)/(65-96) 133/70 mmHg (07/17 0934) SpO2:  [95 %-100 %] 100 % (07/17 0934)  Intake/Output from previous day: 07/16 0701 - 07/17 0700 In: -  Out: 850 [Urine:850] Intake/Output this shift:   Nutritional status: Dysphagia  Neurologic Exam: General: Pleasant. Sitting up in bed, finishing up his breakfast. No as somnolent as yesterday.   Mental Status:  Alert, oriented, thought content appropriate. Speech fluent without evidence of aphasia. Mild dysarthria. Able to follow 3 step commands without difficulty.  Cranial Nerves:  II: Discs flat bilaterally; Visual fields grossly normal, pupils equal, round, reactive to light and accommodation  III,IV, VI: ptosis not present, extra-ocular motions intact bilaterally  V,VII: smile asymmetric with left facial droopiness, facial light touch sensation normal bilaterally  VIII: hearing normal bilaterally  IX,X: gag reflex present  XI: weak left shoulder shrug, normal right shoulder shrug  XII: midline tongue extension without atrophy or fasciculations  Motor:  Significant for left hemiparesis that is improving with UE strength 2/5 today (was 0/5 yesterday), LE 3/5 today (2/5 yesterday) Tone diminished in the left side.  Sensory: Pinprick and light touch intact throughout, bilaterally  Deep Tendon Reflexes:  1 all over  Plantars:  Right: downgoing Left: downgoing  Cerebellar:  normal finger-to-nose and normal heel-to-shin test in the right. Can not perform in  the left side due to weakness.  Gait: unable to test.   Lab Results: Basic Metabolic Panel:  Recent Labs Lab 01/18/14 1025 01/19/14 0452  NA 142 137  K 4.2 4.3  CL 103 100  CO2 27 18*  GLUCOSE 87 237*  BUN 12 14  CREATININE 0.88 0.78  CALCIUM 9.4 9.7    Liver Function Tests:  Recent Labs Lab 01/18/14 1025  AST 18  ALT 36  ALKPHOS 96  BILITOT 0.4  PROT 7.5  ALBUMIN 4.0    CBC:  Recent Labs Lab 01/18/14 1025 01/19/14 0452 01/20/14 0615  WBC 6.9 15.0* 16.5*  NEUTROABS 4.4  --   --   HGB 13.5 14.0 13.8  HCT 42.2 43.1 44.1  MCV 79.3 78.4 81.5  PLT 194 237 199   CBG:  Recent Labs Lab 01/20/14 0744  GLUCAP 174*    Microbiology: Results for orders placed during the hospital encounter of 01/28/11  URINE CULTURE     Status: None   Collection Time    01/28/11  8:43 AM      Result Value Ref Range Status   Specimen Description URINE, RANDOM   Final   Special Requests NONE IMMUNE:NORM UT SYMPT:NEG   Final   Culture  Setup Time [id number]   Final   Colony Count 15,000 COLONIES/ML   Final   Culture     Final   Value: Multiple bacterial morphotypes present, none predominant. Suggest appropriate recollection if clinically indicated.   Report Status 01/30/2011 FINAL   Final  MRSA PCR SCREENING     Status: None   Collection Time  01/28/11  7:24 PM      Result Value Ref Range Status   MRSA by PCR NEGATIVE  NEGATIVE Final   Comment:            The GeneXpert MRSA Assay (FDA     approved for NASAL specimens     only), is one component of a     comprehensive MRSA colonization     surveillance program. It is not     intended to diagnose MRSA     infection nor to guide or     monitor treatment for     MRSA infections.    Coagulation Studies: No results found for this basename: LABPROT, INR,  in the last 72 hours  Imaging: Ct Head Wo Contrast  01/18/2014   CLINICAL DATA:  Multiple falls. Non ambulatory with left frontal headaches. History of multiple  sclerosis.  EXAM: CT HEAD WITHOUT CONTRAST  TECHNIQUE: Contiguous axial images were obtained from the base of the skull through the vertex without intravenous contrast.  COMPARISON:  Head CT 07/02/2011.  FINDINGS: The patient's head is tilted on the CT gantry.  There is no evidence of acute intracranial hemorrhage, mass lesion, brain edema or extra-axial fluid collection. The ventricles and subarachnoid spaces are prominent for age but stable. There is no CT evidence of acute cortical infarction. There is stable mild nonspecific periventricular and subcortical white matter disease.  The visualized paranasal sinuses, mastoid air cells and middle ears are clear. The calvarium is intact.  IMPRESSION: Stable atrophy and periventricular/subcortical white matter disease. No acute intracranial findings identified.   Electronically Signed   By: Roxy Horseman M.D.   On: 01/18/2014 11:32   Mr Laqueta Jean ZO Contrast  01/19/2014   CLINICAL DATA:  Multiple scleroses versus PML.  EXAM: MRI HEAD WITHOUT AND WITH CONTRAST  MRI CERVICAL SPINE WITHOUT AND WITH CONTRAST  TECHNIQUE: Multiplanar, multiecho pulse sequences of the brain and surrounding structures, and cervical spine, to include the craniocervical junction and cervicothoracic junction, were obtained without and with intravenous contrast.  CONTRAST:  20 cc MultiHance  COMPARISON:  CT of the head January 18, 2014  FINDINGS: MRI HEAD FINDINGS  Large body habitus results in decreased overall signal to noise ratio.  Patchy reduced diffusion with corresponding low ADC values in the right posterior frontal and anterior parietal white matter. Confluent reduced diffusion with T2 shine through in right frontal, left temporal periventricular white matter. Lesion show corresponding bright T2 signal. There are additional far greater than 10 supratentorial and at least 1 at left inferior cerebellar white matter lesion, somewhat confluent in the supratentorial brain, many of which radiate  from the periventricular margin. Mild enhancement associated the right frontal subacute changes. Lesion show low T1 signal suggesting black holes of demyelination. No midline shift or mass effect. No susceptibility artifact to suggest hemorrhage.  Moderate ventriculomegaly, likely on the basis of global parenchymal brain volume loss as there is commensurate enlargement of cerebral sulci and cerebellar folia. No abnormal extra-axial fluid collections. Normal major intracranial vascular flow voids seen at the skull base. No abnormal leptomeningeal nor extra-axial masses.  Ocular globes and orbital contents are nonsuspicious though not tailored for evaluation and, optic neuritis could not be excluded on the basis of this examination. No paranasal sinus air-fluid levels. Mastoid air cells appear well-aerated. No abnormal sellar expansion. No cerebellar tonsillar ectopia. No suspicious calvarial bone marrow signal.  MRI CERVICAL SPINE FINDINGS  Large body habitus resultant noisy image quality, in addition,  this is a mild to moderately motion degraded examination. Multiple sequences re-attempted without image quality improvement.  Cervical vertebral bodies and posterior elements are intact and aligned with straightened cervical lordosis. Intervertebral discs demonstrate normal morphology and signal characteristics. No abnormal osseous or intradiscal enhancement.  Cervical spinal cord appears normal morphology and signal characteristics, motion degrades sensitivity for subtle parenchymal signal abnormality. No abnormal cord, leptomeningeal nor epidural enhancement. Included prevertebral and paraspinal soft tissues are nonsuspicious.  No significant disc bulge, canal stenosis or neural foraminal narrowing at any cervical level.  IMPRESSION: MRI of the brain: Probable hyperacute right frontoparietal demyelination a pattern suggesting tumefactive multiple sclerosis, in a background of acute to subacute supra and  infratentorial demyelination. Minimal enhancement associated with the subacute changes in the right frontal lobe. Moderate parenchymal brain volume loss for age.  No definite findings of PML though, recommend correlation with immune status and history of Tsyabri medication.  MRI of the cervical spine: Habitus limited examination. No convincing evidence of demyelination within the cervical spinal cord though habitus and motion degrades sensitivity.  Straightened cervical lordosis without acute fracture, malalignment or neural compressive changes.   Electronically Signed   By: Awilda Metroourtnay  Bloomer   On: 01/19/2014 01:57   Mr Cervical Spine W Wo Contrast  01/19/2014   CLINICAL DATA:  Multiple scleroses versus PML.  EXAM: MRI HEAD WITHOUT AND WITH CONTRAST  MRI CERVICAL SPINE WITHOUT AND WITH CONTRAST  TECHNIQUE: Multiplanar, multiecho pulse sequences of the brain and surrounding structures, and cervical spine, to include the craniocervical junction and cervicothoracic junction, were obtained without and with intravenous contrast.  CONTRAST:  20 cc MultiHance  COMPARISON:  CT of the head January 18, 2014  FINDINGS: MRI HEAD FINDINGS  Large body habitus results in decreased overall signal to noise ratio.  Patchy reduced diffusion with corresponding low ADC values in the right posterior frontal and anterior parietal white matter. Confluent reduced diffusion with T2 shine through in right frontal, left temporal periventricular white matter. Lesion show corresponding bright T2 signal. There are additional far greater than 10 supratentorial and at least 1 at left inferior cerebellar white matter lesion, somewhat confluent in the supratentorial brain, many of which radiate from the periventricular margin. Mild enhancement associated the right frontal subacute changes. Lesion show low T1 signal suggesting black holes of demyelination. No midline shift or mass effect. No susceptibility artifact to suggest hemorrhage.  Moderate  ventriculomegaly, likely on the basis of global parenchymal brain volume loss as there is commensurate enlargement of cerebral sulci and cerebellar folia. No abnormal extra-axial fluid collections. Normal major intracranial vascular flow voids seen at the skull base. No abnormal leptomeningeal nor extra-axial masses.  Ocular globes and orbital contents are nonsuspicious though not tailored for evaluation and, optic neuritis could not be excluded on the basis of this examination. No paranasal sinus air-fluid levels. Mastoid air cells appear well-aerated. No abnormal sellar expansion. No cerebellar tonsillar ectopia. No suspicious calvarial bone marrow signal.  MRI CERVICAL SPINE FINDINGS  Large body habitus resultant noisy image quality, in addition, this is a mild to moderately motion degraded examination. Multiple sequences re-attempted without image quality improvement.  Cervical vertebral bodies and posterior elements are intact and aligned with straightened cervical lordosis. Intervertebral discs demonstrate normal morphology and signal characteristics. No abnormal osseous or intradiscal enhancement.  Cervical spinal cord appears normal morphology and signal characteristics, motion degrades sensitivity for subtle parenchymal signal abnormality. No abnormal cord, leptomeningeal nor epidural enhancement. Included prevertebral and paraspinal soft tissues are  nonsuspicious.  No significant disc bulge, canal stenosis or neural foraminal narrowing at any cervical level.  IMPRESSION: MRI of the brain: Probable hyperacute right frontoparietal demyelination a pattern suggesting tumefactive multiple sclerosis, in a background of acute to subacute supra and infratentorial demyelination. Minimal enhancement associated with the subacute changes in the right frontal lobe. Moderate parenchymal brain volume loss for age.  No definite findings of PML though, recommend correlation with immune status and history of Tsyabri  medication.  MRI of the cervical spine: Habitus limited examination. No convincing evidence of demyelination within the cervical spinal cord though habitus and motion degrades sensitivity.  Straightened cervical lordosis without acute fracture, malalignment or neural compressive changes.   Electronically Signed   By: Awilda Metro   On: 01/19/2014 01:57    Medications: I have reviewed the patient's current medications.  Assessment/Plan: 28 year man with PMH of RR-MS since 2012, comes in with left hemiparesis, left sided numbness and left face weakness since approximately 01/03/14. Has been doing well on Dimethyl fumarate (Tecfidera) until this presentation. Serologies including WBC and lymphocyte count initially unremarkable with no recent fever or infection. He has a leucocytosis that is trending up which is likely secondary to steroid therapy. Remains afebrile with no cough/diarrhea/urinary symptoms.  Most likely MS relapse. Dimethyl fumarate associated PML with and without associated severe lymphopenia have been recently described but this has been ruled out per MRI. 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   Recommend:  1) IV solumedrol 1 gram daily  5 days (day 3/5).   2) Continue Tecfidera.  3) PT  4) Speech Therapist for cognitive-linguistic evaluation  Will continue following  Leonia Reeves, MD PGY-3 IMTS  Working with [facility]  [phone]  01/20/2014, 10:45 AM  Patient seen and discussed with medicine resident Dr. Garald Braver and I agree with above assessment and plan.  Wyatt Portela, MD [facility]

## 2014-01-20 NOTE — Progress Notes (Signed)
Rehab admissions - I met with pt in follow up to rehab MD consult earlier today. OT was in room with pt and pt was very lethargic.   Pt's parents then came to pt's room and I explained the possibility of inpatient rehab. They are interested in pursuing this for pt. I explained that rehab MD consult is awaiting better activity tolerance and that pt's participation with therapies will be very important.  Further questions were answered and informational brochures were given. I will follow up with pt's case on Monday to see how he is progressing with therapies.  We will consider possible inpatient admit pending his increased activity tolerance, medical clearance and our bed availability.  Please call me with any questions. Thanks.  Nanetta Batty, PT Rehabilitation Admissions Coordinator [phone]

## 2014-01-20 NOTE — Evaluation (Signed)
Speech Language Pathology Evaluation Patient Details Name: Brandon [name] MRN: [id number] DOB: 07-19-1990 Today's Date: 01/20/2014 Time: [id number] SLP Time Calculation (min): 23 min  Problem List:  Patient Active Problem List   Diagnosis Date Noted  . Left-sided weakness 01/18/2014  . Multiple sclerosis 05/20/2013  . 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 Abnormality of gait 11/23/2012  . HTN (hypertension) 07/04/2011  . Schizoaffective disorder 07/04/2011  . 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 OBESITY 01/14/2010  . ADHD 01/14/2010   Past Medical History:  Past Medical History  Diagnosis Date  . ADHD (attention deficit hyperactivity disorder)   . Hypertension   . Schizophrenia   . Bipolar 1 disorder   . 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 Obesity   . Multiple sclerosis 05/20/2013   Past Surgical History:  Past Surgical History  Procedure Laterality Date  . None     HPI:  23 y/o male with PMH of RR-MS since 2012, comes in with left hemiparesis, left sided numbness and left face weakness since approximately 01/03/14. 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    Assessment / Plan / Recommendation Clinical Impression  Pt exhibits a moderate dysarthria characterized by reduced breath support, low vocal intensity, and imprecise articulation. Pt increased intelligibility at the phrase-sentence level with Mod cues for utilization of strategies. He also demonstrates impairments with recall of new information, emergent and anticipatory awareness, and basic problem solving, with overall slowed processing and decreased mental flexibility. Pt's baseline level of function is unknown, and he was lethargic throughout evaluation which likely also impacts function. Pt will  benefit from skilled SLP services to maximize functional communication and cognitive function.    SLP Assessment  Patient needs continued Speech Lanaguage Pathology Services    Follow Up Recommendations  Inpatient Rehab;24 hour supervision/assistance    Frequency and Duration min 2x/week  2 weeks   Pertinent Vitals/Pain n/a   SLP Goals  SLP Goals Potential to Achieve Goals: Good Potential Considerations: Ability to learn/carryover information;Previous level of function  SLP Evaluation Prior Functioning  Cognitive/Linguistic Baseline: Information not available   Cognition  Overall Cognitive Status: No family/caregiver present to determine baseline cognitive functioning Arousal/Alertness: Lethargic Orientation Level: Oriented X4 Attention: Sustained Sustained Attention: Impaired Sustained Attention Impairment: Verbal basic;Functional basic Memory: Impaired Memory Impairment: Retrieval deficit;Decreased recall of new information;Decreased short term memory Decreased Short Term Memory: Verbal basic Awareness: Impaired Awareness Impairment: Emergent impairment;Anticipatory impairment Problem Solving: Impaired Problem Solving Impairment: Functional basic;Verbal basic Safety/Judgment: Impaired    Comprehension  Auditory Comprehension Overall Auditory Comprehension: Appears within functional limits for tasks assessed Visual Recognition/Discrimination Discrimination: Not tested Reading Comprehension Reading Status: Not tested    Expression Expression Primary Mode of Expression: Verbal Verbal Expression Overall Verbal Expression: Appears within functional limits for tasks assessed Written Expression Dominant Hand: Right Written Expression: Not tested   Oral / Motor Oral Motor/Sensory Function Overall Oral Motor/Sensory Function: Impaired Labial ROM: Reduced left Labial Symmetry: Abnormal symmetry left Labial Strength: Reduced Labial Sensation: Reduced Lingual ROM:  Reduced left Lingual Symmetry: Abnormal symmetry left Lingual Strength: Reduced Lingual Sensation: Reduced Facial ROM: Reduced left Facial Symmetry: Left droop Facial Strength: Reduced Mandible: Within Functional Limits Motor Speech Overall Motor Speech: Impaired Respiration: Impaired Level of Impairment: Sentence Phonation: Low vocal intensity Resonance: Within functional limits Articulation:  Impaired Level of Impairment: Sentence Intelligibility: Intelligibility reduced Sentence: 50-74% accurate Conversation: 50-74% accurate Motor Planning: Witnin functional limits Motor Speech Errors: Not applicable Effective Techniques: Pause;Over-articulate;Increased vocal intensity   GO      Brandon [name], M.A. CCC-SLP [phone]  Brandon [name] 01/20/2014, 11:31 AM

## 2014-01-20 NOTE — Progress Notes (Signed)
Speech Language Pathology Treatment: Dysphagia  Patient Details Name: Brandon [name] MRN: [id number] DOB: 05-08-1991 Today's Date: 01/20/2014 Time: [id number] SLP Time Calculation (min): 10 min  Assessment / Plan / Recommendation Clinical Impression  Pt consumed thin liquids via straw with Max verbal cues to slow rate of intake. Pt consumed Dys 1 textures, declining more advanced textures because he preferred to eat his ice cream. Of note, the remainder of pt's breakfast tray consisting of Dys 3 textures had been cleared, and pt denied any difficulty with intake. Please refer to cognitive-linguistic evaluation for further details.   HPI HPI: 23 y/o male with PMH of RR-MS since 2012, comes in with left hemiparesis, left sided numbness and left face weakness since approximately 01/03/14. 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    Pertinent Vitals n/a  SLP Plan  Continue with current plan of care    Recommendations Diet recommendations: Dysphagia 3 (mechanical soft);Thin liquid Liquids provided via: Cup;Straw Medication Administration: Whole meds with puree Supervision: Patient able to self feed;Intermittent supervision to cue for compensatory strategies Compensations: Slow rate;Small sips/bites;Check for pocketing;Check for anterior loss Postural Changes and/or Swallow Maneuvers: Seated upright 90 degrees;Upright 30-60 min after meal              General recommendations: Rehab consult Oral Care Recommendations: Oral care BID Follow up Recommendations: Inpatient Rehab Plan: Continue with current plan of care    GO      Brandon [name], M.A. CCC-SLP [phone]  Brandon Hamaiewonsky, Brandon [name] 01/20/2014, 11:21 AM

## 2014-01-20 NOTE — Progress Notes (Signed)
Occupational Therapy Evaluation Patient Details Name: Brandon LawrenceRaymond S [name] MRN: [id number] DOB: [birth year] Today's Date: 01/20/2014    History of Present Illness 23 y.o. male admitted to [facility] on 01/18/14 with left sided weakness and multiple falls at home.  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 Pt with significant PMHx of ADHD, HTN, schizophrenia, bipolar disorder, obesity, and MS (looks like he was being followed by Dr. Anne HahnWillis as an OP).     Clinical Impression   PTA pt lived at home with his family and was independent with use of assistive devices (rollator). Pt severely limited by lethargy today, however was able to rouse pt and develop rapport to do bedside evaluation. Due to lethargy, it was not safe to get pt OOB at this time. Pt requires VC's to remain alert and attentive to task. Pt would benefit from skilled OT and CIR to increase independence prior to d/c as pt is able to tolerate more therapy.     Follow Up Recommendations  CIR;Supervision/Assistance - 24 hour    Equipment Recommendations  Other (comment) (Defer to next venue)       Precautions / Restrictions Precautions Precautions: Fall Precaution Comments: pt presenting with left hemipresis, h/o multiple falls at home PTA Restrictions Weight Bearing Restrictions: No      Mobility Bed Mobility Overal bed mobility: Needs Assistance;+2 for physical assistance Bed Mobility: Rolling Rolling: Mod assist;+2 for physical assistance         General bed mobility comments: Pt able to roll to his Lt side well but required assist and VC's to stay on his side for duration of enema.   Transfers                 General transfer comment: Pt unable to attempt transfers due to lethargy this date. May need to consider attempting Huntley DecSara Plus or other lift assist equipment to promote WB through legs.         ADL Overall ADL's : Needs assistance/impaired Eating/Feeding: Minimal  assistance;Sitting Eating/Feeding Details (indicate cue type and reason): hand over hand (A) to hold container of ice cream with LUE while he used spoon with RUE.  Grooming: Therapist, nutritionalWash/dry face;Set up;Sitting   Upper Body Bathing: Moderate assistance;Sitting   Lower Body Bathing: Total assistance;Bed level   Upper Body Dressing : Maximal assistance;Sitting   Lower Body Dressing: Total assistance;Bed level   Toilet Transfer: Total assistance   Toileting- Clothing Manipulation and Hygiene: Total assistance   Tub/ Shower Transfer: Total assistance   Functional mobility during ADLs:  (Pt not able to ambulate at this time) General ADL Comments: Pt is lethargic and has difficulty remaining awake throughout OT session. RN reports that pt has been this way all day. Pt minimally responsive at beginning of session but as he gradually woke up and developed rapport with OT, he became more conversant. Pt was unable to describe the typical progression of his MS exacerbations. He performed rolling in bed with assist for RN to apply enema.      Vision  Pt reports no change from baseline.  Pt wears glasses. In his room he has Rx sunglasses and reports he has regular glasses at home.              Additional Comments: Vision to be further assessed   Perception Perception Perception Tested?: No   Praxis Praxis Praxis tested?: Within functional limits    Pertinent Vitals/Pain NAD; pt likely too lethargic to answer  Hand Dominance Right   Extremity/Trunk Assessment Upper Extremity Assessment Upper Extremity Assessment: LUE deficits/detail;Generalized weakness LUE Deficits / Details: Pt shows trace movement throughout LUE and reports he can move it "a little bit."  LUE Coordination: decreased fine motor;decreased gross motor   Lower Extremity Assessment Lower Extremity Assessment: Defer to PT evaluation       Communication Communication Communication: Expressive difficulties (slurred  speech; possibly due to lethargy)   Cognition Arousal/Alertness: Lethargic Behavior During Therapy: WFL for tasks assessed/performed Overall Cognitive Status: No family/caregiver present to determine baseline cognitive functioning Area of Impairment: Attention;Following commands;Awareness;Problem solving   Current Attention Level: Focused Memory: Decreased short-term memory Following Commands: Follows one step commands inconsistently;Follows one step commands with increased time   Awareness: Intellectual Problem Solving: Slow processing;Decreased initiation;Difficulty sequencing;Requires tactile cues;Requires verbal cues General Comments: Pt with slow processing. Difficult to determine cognitive impairment vs. lethargy vs. behavioral. Pt initially slow to answer yes/no and would wave his Rt hand to mean "sort of." As pt became more alert and warmed up to therapist, he became more conversant however speech was slurred and slow, difficult to understand at times.       Exercises Exercises: General Upper Extremity     01/20/14 1600  General Exercises - Upper Extremity  Shoulder Flexion PROM;Left;10 reps;Supine  Shoulder ABduction PROM;Left;10 reps;Supine  Elbow Flexion PROM;Left;10 reps;Supine  Elbow Extension PROM;Left;10 reps;Supine  Wrist Flexion PROM;Left;10 reps;Supine  Wrist Extension PROM;Left;10 reps;Supine  Digit Composite Flexion AAROM;Left;10 reps;Supine  Composite Extension AAROM;Left;10 reps;Supine           Home Living Family/patient expects to be discharged to:: Private residence Living Arrangements: Parent (and brother (17y.o.); mother is in power w/c) Available Help at Discharge: Available 24 hours/day;Family Type of Home: House Home Access: Stairs to enter Secretary/administrator of Steps: 1 Entrance Stairs-Rails: None Home Layout: One level     Bathroom Shower/Tub: Chief Strategy Officer: Standard Bathroom Accessibility: Yes How Accessible:  Accessible via walker Home Equipment: Walker - 2 wheels (rollator)          Prior Functioning/Environment Level of Independence: Independent with assistive device(s)        Comments: Used rollator before    OT Diagnosis: Generalized weakness;Cognitive deficits;Acute pain;Paresis   OT Problem List: Decreased strength;Decreased range of motion;Decreased activity tolerance;Impaired balance (sitting and/or standing);Decreased cognition;Decreased safety awareness;Decreased knowledge of use of DME or AE;Decreased knowledge of precautions;Impaired sensation;Impaired UE functional use;Pain   OT Treatment/Interventions: Self-care/ADL training;Therapeutic exercise;Energy conservation;DME and/or AE instruction;Therapeutic activities;Patient/family education;Cognitive remediation/compensation;Balance training    OT Goals(Current goals can be found in the care plan section) Acute Rehab OT Goals Patient Stated Goal: to finish school  OT Goal Formulation: With patient Time For Goal Achievement: 02/03/14 Potential to Achieve Goals: Good ADL Goals Pt Will Perform Eating: with modified independence;sitting (EOB) Pt Will Perform Grooming: with modified independence;sitting (EOB) Pt Will Transfer to Toilet: with min assist;stand pivot transfer;bedside commode  OT Frequency: Min 2X/week   Barriers to D/C:   Pt requires higher level of care at present than family can provide.               End of Session Nurse Communication: Mobility status  Activity Tolerance: Patient limited by lethargy Patient left: in bed;with call bell/phone within reach;with bed alarm set   Time: 1424-1505 OT Time Calculation (min): 41 min Charges:  OT General Charges $OT Visit: 1 Procedure OT Evaluation $Initial OT Evaluation Tier I: 1 Procedure OT Treatments $Self Care/Home Management : 23-37  mins  Rae Lips [phone] 01/20/2014, 4:57 PM

## 2014-01-21 DIAGNOSIS — R279 Unspecified lack of coordination: Secondary | ICD-10-CM

## 2014-01-21 LAB — GLUCOSE, CAPILLARY
Glucose-Capillary: 329 mg/dL — ABNORMAL HIGH (ref 70–99)
Glucose-Capillary: 205 mg/dL — ABNORMAL HIGH (ref 70–99)
Glucose-Capillary: 237 mg/dL — ABNORMAL HIGH (ref 70–99)
Glucose-Capillary: 281 mg/dL — ABNORMAL HIGH (ref 70–99)

## 2014-01-21 MED ORDER — SODIUM CHLORIDE 0.9 % IV SOLN
1000.0000 mg | Freq: Every day | INTRAVENOUS | Status: AC
  Administered 2014-01-21 – 2014-01-22 (×2): 1000 mg via INTRAVENOUS
  Filled 2014-01-21 (×3): qty 8

## 2014-01-21 NOTE — Progress Notes (Addendum)
[facility]                                                                              Patient Demographics  Brandon [name], is a 23 y.o. male, DOB - [birth year], ZOX:[id number]  Admit date - 01/18/2014   Admitting Physician Joseph Art, DO  Outpatient Primary MD for the patient is OSEI-BONSU,GEORGE, MD  LOS - 3   Chief Complaint  Patient presents with  . Fall  . Multiple Sclerosis      HPI 01/18/2014 Brandon [name] is a 23 y.o. Male with suspected MS. Per family at bedside and mother on the phone, he came in with increasing left leg weakness and multiple falls. Patient usually uses a walker but it has been broken. Mother stated that her son has fallen several times.  Family originally was able to get him up and back to bed, but the last time he fell they were unable to get him up. They called for an ambulance. The patient fell again after EMS got him up so they took him to the emergency room.  Upon admission, patient denied fever, chills, shortness of breath, chest pain.  He follows with Dr. Anne Hahn (neurology) and was last seen on 12/12/2013. In the ER, his head CT was unremarkable. Patient was seen by neurology and MRI was ordered along with steroids for possible MS exacerbation. Labs were remarkably normal  Interim history Patient has been receiving solumedrol Ig IV daily, will receive 5 doses, to end on 01/22/2014.  PT recommend CIR. CIR will evaluate patient on Monday, January 23, 2014.  Patient does not wish to attend rehab at a SNF.  Assessment & Plan   Left-sided weakness/possible multiple sclerosis exacerbation -Continue Tecfidera, possibly noncompliant.  He was on Tysabri.   -MRI: Probable hyperacute right frontoparietal demyelination a pattern suggesting tumefactive multiple sclerosis -Neurology consulted, following, and appreciated- recommended Solumedrol 1g for 5 days, Day 4 -PT consulted and recommended CIR.  Also recommended wheelchair/cushion and hospital bed and  hoyer lift.  -OT and speech consulted -CIR consulted, pending evaluation.   Obesity, BMI 51.7 -Encourage weight loss, diet modifications -Patient should discuss this with his primary care physician  Leukocytosis -Likely secondary to steroids -UA negative for infection, patient does not have cough or shortness of breath, and is afebrile  Bipolar disorder, schizophrenia, ADHD -Continue Cogentin, Wellbutrin, lithium, loxapine, Seroquel, Trilafon, Dimethyl fumarate  Hypertension -Continue clonidine.  Code Status: Full  Family Communication: None at bedside  Disposition Plan: Admitted.  Giving an additional 2 doses of solumedrol, ?discharge on 01/23/2014. CIR consult pending.  Time Spent in minutes   25 minutes  Procedures  None  Consults   Neurology CIR   DVT Prophylaxis  Heparin   Lab Results  Component Value Date   PLT 199 01/20/2014    Medications  Scheduled Meds: . benztropine  1 mg Oral QHS  . buPROPion  300 mg Oral Daily  . cloNIDine HCl  0.1 mg Oral QHS  . Dextromethorphan-Quinidine  1 capsule Oral BID  . Dimethyl Fumarate  240 mg Oral BID  . enoxaparin (LOVENOX) injection  90 mg Subcutaneous Q24H  . insulin aspart  0-15 Units Subcutaneous  TID WC  . lithium carbonate  450 mg Oral BID  . methylPREDNISolone (SOLU-MEDROL) injection  1,000 mg Intravenous Daily  . OLANZapine Pamoate  300 mg Intramuscular Q14 Days  . perphenazine  4 mg Oral BID  . QUEtiapine  400 mg Oral QHS   Continuous Infusions:  PRN Meds:.acetaminophen, acetaminophen  Antibiotics    Anti-infectives   None      Subjective:   Brandon [name] seen and examined today.  Patient more arousable and talkative today.  He states he is feeling better and denies any weakness at this time.  He denies dizziness, headache, chest pain, shortness of breath, abdominal pain.    Objective:   Filed Vitals:   01/20/14 1903 01/20/14 2111 01/21/14 0142 01/21/14 0542  BP: 159/84 150/80 172/87 147/84    Pulse: 72 65 74 87  Temp: 98.3 F (36.8 C) 98.2 F (36.8 C) 97.2 F (36.2 C) 98 F (36.7 C)  TempSrc: Oral Oral Axillary Axillary  Resp: 18 18 18 18   Height:      Weight:      SpO2: 93% 98% 99% 96%    Wt Readings from Last 3 Encounters:  01/18/14 177.3 kg (390 lb 14 oz)  12/12/13 176.903 kg (390 lb)  11/18/13 176.449 kg (389 lb)    No intake or output data in the 24 hours ending 01/21/14 0950  Exam  General: Well developed, well nourished, NAD  HEENT: NCAT, mucous membranes moist.   Cardiovascular: S1 S2 auscultated, regular rate and rhythm.  Respiratory: Clear to auscultation bilaterally with equal chest rise  Abdomen: Soft, Obese, nontender, nondistended, + bowel sounds  Extremities: warm dry without cyanosis clubbing or edema  Neuro: AAO x 3, no focal deficits  Skin: Without rashes exudates or nodules  Data Review   Micro Results No results found for this or any previous visit (from the past 240 hour(s)).  Radiology Reports Ct Head Wo Contrast  01/18/2014   CLINICAL DATA:  Multiple falls. Non ambulatory with left frontal headaches. History of multiple sclerosis.  EXAM: CT HEAD WITHOUT CONTRAST  TECHNIQUE: Contiguous axial images were obtained from the base of the skull through the vertex without intravenous contrast.  COMPARISON:  Head CT 07/02/2011.  FINDINGS: The patient's head is tilted on the CT gantry.  There is no evidence of acute intracranial hemorrhage, mass lesion, brain edema or extra-axial fluid collection. The ventricles and subarachnoid spaces are prominent for age but stable. There is no CT evidence of acute cortical infarction. There is stable mild nonspecific periventricular and subcortical white matter disease.  The visualized paranasal sinuses, mastoid air cells and middle ears are clear. The calvarium is intact.  IMPRESSION: Stable atrophy and periventricular/subcortical white matter disease. No acute intracranial findings identified.    Electronically Signed   By: Roxy Horseman M.D.   On: 01/18/2014 11:32   Mr Laqueta Jean WR Contrast  01/19/2014   CLINICAL DATA:  Multiple scleroses versus PML.  EXAM: MRI HEAD WITHOUT AND WITH CONTRAST  MRI CERVICAL SPINE WITHOUT AND WITH CONTRAST  TECHNIQUE: Multiplanar, multiecho pulse sequences of the brain and surrounding structures, and cervical spine, to include the craniocervical junction and cervicothoracic junction, were obtained without and with intravenous contrast.  CONTRAST:  20 cc MultiHance  COMPARISON:  CT of the head January 18, 2014  FINDINGS: MRI HEAD FINDINGS  Large body habitus results in decreased overall signal to noise ratio.  Patchy reduced diffusion with corresponding low ADC values in the right posterior frontal and  anterior parietal white matter. Confluent reduced diffusion with T2 shine through in right frontal, left temporal periventricular white matter. Lesion show corresponding bright T2 signal. There are additional far greater than 10 supratentorial and at least 1 at left inferior cerebellar white matter lesion, somewhat confluent in the supratentorial brain, many of which radiate from the periventricular margin. Mild enhancement associated the right frontal subacute changes. Lesion show low T1 signal suggesting black holes of demyelination. No midline shift or mass effect. No susceptibility artifact to suggest hemorrhage.  Moderate ventriculomegaly, likely on the basis of global parenchymal brain volume loss as there is commensurate enlargement of cerebral sulci and cerebellar folia. No abnormal extra-axial fluid collections. Normal major intracranial vascular flow voids seen at the skull base. No abnormal leptomeningeal nor extra-axial masses.  Ocular globes and orbital contents are nonsuspicious though not tailored for evaluation and, optic neuritis could not be excluded on the basis of this examination. No paranasal sinus air-fluid levels. Mastoid air cells appear well-aerated. No  abnormal sellar expansion. No cerebellar tonsillar ectopia. No suspicious calvarial bone marrow signal.  MRI CERVICAL SPINE FINDINGS  Large body habitus resultant noisy image quality, in addition, this is a mild to moderately motion degraded examination. Multiple sequences re-attempted without image quality improvement.  Cervical vertebral bodies and posterior elements are intact and aligned with straightened cervical lordosis. Intervertebral discs demonstrate normal morphology and signal characteristics. No abnormal osseous or intradiscal enhancement.  Cervical spinal cord appears normal morphology and signal characteristics, motion degrades sensitivity for subtle parenchymal signal abnormality. No abnormal cord, leptomeningeal nor epidural enhancement. Included prevertebral and paraspinal soft tissues are nonsuspicious.  No significant disc bulge, canal stenosis or neural foraminal narrowing at any cervical level.  IMPRESSION: MRI of the brain: Probable hyperacute right frontoparietal demyelination a pattern suggesting tumefactive multiple sclerosis, in a background of acute to subacute supra and infratentorial demyelination. Minimal enhancement associated with the subacute changes in the right frontal lobe. Moderate parenchymal brain volume loss for age.  No definite findings of PML though, recommend correlation with immune status and history of Tsyabri medication.  MRI of the cervical spine: Habitus limited examination. No convincing evidence of demyelination within the cervical spinal cord though habitus and motion degrades sensitivity.  Straightened cervical lordosis without acute fracture, malalignment or neural compressive changes.   Electronically Signed   By: Awilda Metro   On: 01/19/2014 01:57   Mr Cervical Spine W Wo Contrast  01/19/2014   CLINICAL DATA:  Multiple scleroses versus PML.  EXAM: MRI HEAD WITHOUT AND WITH CONTRAST  MRI CERVICAL SPINE WITHOUT AND WITH CONTRAST  TECHNIQUE: Multiplanar,  multiecho pulse sequences of the brain and surrounding structures, and cervical spine, to include the craniocervical junction and cervicothoracic junction, were obtained without and with intravenous contrast.  CONTRAST:  20 cc MultiHance  COMPARISON:  CT of the head January 18, 2014  FINDINGS: MRI HEAD FINDINGS  Large body habitus results in decreased overall signal to noise ratio.  Patchy reduced diffusion with corresponding low ADC values in the right posterior frontal and anterior parietal white matter. Confluent reduced diffusion with T2 shine through in right frontal, left temporal periventricular white matter. Lesion show corresponding bright T2 signal. There are additional far greater than 10 supratentorial and at least 1 at left inferior cerebellar white matter lesion, somewhat confluent in the supratentorial brain, many of which radiate from the periventricular margin. Mild enhancement associated the right frontal subacute changes. Lesion show low T1 signal suggesting black holes of demyelination. No  midline shift or mass effect. No susceptibility artifact to suggest hemorrhage.  Moderate ventriculomegaly, likely on the basis of global parenchymal brain volume loss as there is commensurate enlargement of cerebral sulci and cerebellar folia. No abnormal extra-axial fluid collections. Normal major intracranial vascular flow voids seen at the skull base. No abnormal leptomeningeal nor extra-axial masses.  Ocular globes and orbital contents are nonsuspicious though not tailored for evaluation and, optic neuritis could not be excluded on the basis of this examination. No paranasal sinus air-fluid levels. Mastoid air cells appear well-aerated. No abnormal sellar expansion. No cerebellar tonsillar ectopia. No suspicious calvarial bone marrow signal.  MRI CERVICAL SPINE FINDINGS  Large body habitus resultant noisy image quality, in addition, this is a mild to moderately motion degraded examination. Multiple sequences  re-attempted without image quality improvement.  Cervical vertebral bodies and posterior elements are intact and aligned with straightened cervical lordosis. Intervertebral discs demonstrate normal morphology and signal characteristics. No abnormal osseous or intradiscal enhancement.  Cervical spinal cord appears normal morphology and signal characteristics, motion degrades sensitivity for subtle parenchymal signal abnormality. No abnormal cord, leptomeningeal nor epidural enhancement. Included prevertebral and paraspinal soft tissues are nonsuspicious.  No significant disc bulge, canal stenosis or neural foraminal narrowing at any cervical level.  IMPRESSION: MRI of the brain: Probable hyperacute right frontoparietal demyelination a pattern suggesting tumefactive multiple sclerosis, in a background of acute to subacute supra and infratentorial demyelination. Minimal enhancement associated with the subacute changes in the right frontal lobe. Moderate parenchymal brain volume loss for age.  No definite findings of PML though, recommend correlation with immune status and history of Tsyabri medication.  MRI of the cervical spine: Habitus limited examination. No convincing evidence of demyelination within the cervical spinal cord though habitus and motion degrades sensitivity.  Straightened cervical lordosis without acute fracture, malalignment or neural compressive changes.   Electronically Signed   By: Awilda Metroourtnay  Bloomer   On: 01/19/2014 01:57    CBC  Recent Labs Lab 01/18/14 1025 01/19/14 0452 01/20/14 0615  WBC 6.9 15.0* 16.5*  HGB 13.5 14.0 13.8  HCT 42.2 43.1 44.1  PLT 194 237 199  MCV 79.3 78.4 81.5  MCH 25.4* 25.5* 25.5*  MCHC 32.0 32.5 31.3  RDW 14.5 14.1 14.8  LYMPHSABS 1.7  --   --   MONOABS 0.4  --   --   EOSABS 0.3  --   --   BASOSABS 0.0  --   --     Chemistries   Recent Labs Lab 01/18/14 1025 01/19/14 0452  NA 142 137  K 4.2 4.3  CL 103 100  CO2 27 18*  GLUCOSE 87 237*    BUN 12 14  CREATININE 0.88 0.78  CALCIUM 9.4 9.7  AST 18  --   ALT 36  --   ALKPHOS 96  --   BILITOT 0.4  --    ------------------------------------------------------------------------------------------------------------------ estimated creatinine clearance is 245.6 ml/min (by C-G formula based on Cr of 0.78). ------------------------------------------------------------------------------------------------------------------  Recent Labs  01/20/14 0615  HGBA1C 6.1*   ------------------------------------------------------------------------------------------------------------------ No results found for this basename: CHOL, HDL, LDLCALC, TRIG, CHOLHDL, LDLDIRECT,  in the last 72 hours ------------------------------------------------------------------------------------------------------------------  Recent Labs  01/18/14 2015  TSH 0.848   ------------------------------------------------------------------------------------------------------------------ No results found for this basename: VITAMINB12, FOLATE, FERRITIN, TIBC, IRON, RETICCTPCT,  in the last 72 hours  Coagulation profile No results found for this basename: INR, PROTIME,  in the last 168 hours  No results found for this basename: DDIMER,  in the  last 72 hours  Cardiac Enzymes No results found for this basename: CK, CKMB, TROPONINI, MYOGLOBIN,  in the last 168 hours ------------------------------------------------------------------------------------------------------------------ No components found with this basename: POCBNP,     Brieann Osinski D.O. on 01/21/2014 at 9:50 AM  Between 7am to 7pm - Pager - [phone]  After 7pm go to [email or web address] - password [email or web address]  And look for the night coverage person covering for me after hours  [facility] Office  [phone]

## 2014-01-22 LAB — CBC
HCT: 41.8 % (ref 39.0–52.0)
Hemoglobin: 13.7 g/dL (ref 13.0–17.0)
MCH: 26.2 pg (ref 26.0–34.0)
MCHC: 32.8 g/dL (ref 30.0–36.0)
MCV: 79.9 fL (ref 78.0–100.0)
Platelets: 223 10*3/uL (ref 150–400)
RBC: 5.23 MIL/uL (ref 4.22–5.81)
RDW: 14.8 % (ref 11.5–15.5)
WBC: 17 10*3/uL — ABNORMAL HIGH (ref 4.0–10.5)

## 2014-01-22 LAB — GLUCOSE, CAPILLARY
Glucose-Capillary: 240 mg/dL — ABNORMAL HIGH (ref 70–99)
Glucose-Capillary: 221 mg/dL — ABNORMAL HIGH (ref 70–99)
Glucose-Capillary: 286 mg/dL — ABNORMAL HIGH (ref 70–99)
Glucose-Capillary: 405 mg/dL — ABNORMAL HIGH (ref 70–99)

## 2014-01-22 NOTE — Progress Notes (Signed)
Clinical Social Worker (CSW) attempted to meet with patient however he was sleeping. CSW contacted patient's mother Johny BlamerRunnett Ziemann to discuss D/C plan. Mother reported that she wants patient to go to CIR or she will take him home. Mother refused SNF and did not give CSW permission to send out referral. CSW will continue to follow and assist as needed.   Jetta LoutBailey Morgan, LCSWA Weekend CSW [phone]

## 2014-01-22 NOTE — Progress Notes (Signed)
NEURO HOSPITALIST PROGRESS NOTE   SUBJECTIVE:                                                                                                                        States that he is still weak in the left side but somewhat better and family concurs. Completed 5 days IV solumedrol. PT help much appreciated.  OBJECTIVE:                                                                                                                           Vital signs in last 24 hours: Temp:  [97.9 F (36.6 C)-98.4 F (36.9 C)] 98.1 F (36.7 C) (07/19 1026) Pulse Rate:  [63-89] 63 (07/19 1026) Resp:  [18-20] 18 (07/19 1026) BP: (146-160)/(2-87) 152/80 mmHg (07/19 1026) SpO2:  [92 %-98 %] 98 % (07/19 1026)  Intake/Output from previous day:   Intake/Output this shift:   Nutritional status: Dysphagia  Past Medical History  Diagnosis Date  . ADHD (attention deficit hyperactivity disorder)   . Hypertension   . Schizophrenia   . Bipolar 1 disorder   . 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 Obesity   . Multiple sclerosis 05/20/2013    Neurologic Exam:  Mental Status:  Alert, oriented, thought content appropriate. Speech fluent without evidence of aphasia. Mild dysarthria. Able to follow 3 step commands without difficulty.  Cranial Nerves:  II: Discs flat bilaterally; Visual fields grossly normal, pupils equal, round, reactive to light and accommodation  III,IV, VI: ptosis not present, extra-ocular motions intact bilaterally  V,VII: smile asymmetric with left facial droopiness, facial light touch sensation normal bilaterally  VIII: hearing normal bilaterally  IX,X: gag reflex present  XI: weak left shoulder shrug, normal right shoulder shrug  XII: midline tongue extension without atrophy or fasciculations  Motor:  Significant for left hemiparesis that is improving with UE strength 2/5 today (was 0/5 yesterday), LE 3/5 today (2/5 yesterday)   Tone diminished in the left side.  Sensory: Pinprick and light touch intact throughout, bilaterally  Deep Tendon Reflexes:  Mildly brisk all over  Plantars:  Right: downgoing Left: downgoing  Cerebellar:  normal finger-to-nose and normal heel-to-shin test in the right. Can not perform in the  left side due to weakness.  Gait: unable to test.   Lab Results: Lab Results  Component Value Date/Time   CHOL 113 01/29/2011  3:10 AM   Lipid Panel No results found for this basename: CHOL, TRIG, HDL, CHOLHDL, VLDL, LDLCALC,  in the last 72 hours  Studies/Results: No results found.  MEDICATIONS                                                                                                                        Scheduled: . benztropine  1 mg Oral QHS  . buPROPion  300 mg Oral Daily  . cloNIDine HCl  0.1 mg Oral QHS  . Dextromethorphan-Quinidine  1 capsule Oral BID  . Dimethyl Fumarate  240 mg Oral BID  . enoxaparin (LOVENOX) injection  90 mg Subcutaneous Q24H  . insulin aspart  0-15 Units Subcutaneous TID WC  . lithium carbonate  450 mg Oral BID  . OLANZapine Pamoate  300 mg Intramuscular Q14 Days  . perphenazine  4 mg Oral BID  . QUEtiapine  400 mg Oral QHS    ASSESSMENT/PLAN:                                                                                                            [age] year man with PMH of RR-MS since 2012, comes in with left hemiparesis, left sided numbness and left face weakness since approximately 01/03/14. Has been doing well on Dimethyl fumarate (Tecfidera) until this presentation. 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 No demyelinating lesion visualized on MRI cervical spine. Patient will benefit from CIR. Needs to follow up with his neurologist who will determine what the next step should be in terms of management of tumefactive MS. Neurology will sign off.  Brandon Portelasvaldo Prinston Kynard, MD [facility] [phone]  01/22/2014, 2:48  PM

## 2014-01-22 NOTE — Progress Notes (Signed)
[facility]                                                                              Patient Demographics  Brandon [name], is a 23 y.o. male, DOB - [birth year], ZOX:[id number]  Admit date - 01/18/2014   Admitting Physician Joseph Art, DO  Outpatient Primary MD for the patient is OSEI-BONSU,GEORGE, MD  LOS - 4   Chief Complaint  Patient presents with  . Fall  . Multiple Sclerosis      HPI 01/18/2014 Brandon [name] is a 23 y.o. Male with suspected MS. Per family at bedside and mother on the phone, he came in with increasing left leg weakness and multiple falls. Patient usually uses a walker but it has been broken. Mother stated that her son has fallen several times.  Family originally was able to get him up and back to bed, but the last time he fell they were unable to get him up. They called for an ambulance. The patient fell again after EMS got him up so they took him to the emergency room.  Upon admission, patient denied fever, chills, shortness of breath, chest pain.  He follows with Dr. Anne Hahn (neurology) and was last seen on 12/12/2013. In the ER, his head CT was unremarkable. Patient was seen by neurology and MRI was ordered along with steroids for possible MS exacerbation. Labs were remarkably normal  Interim history Patient has been receiving solumedrol Ig IV daily, will receive 5 doses, to end on 01/22/2014.  PT recommend CIR. CIR will evaluate patient on Monday, January 23, 2014.  Patient does not wish to attend rehab at a SNF.  Assessment & Plan   Left-sided weakness/possible multiple sclerosis exacerbation -Continue Tecfidera, possibly noncompliant.  He was on Tysabri.   -MRI: Probable hyperacute right frontoparietal demyelination a pattern suggesting tumefactive multiple sclerosis -Neurology consulted, following, and appreciated- recommended Solumedrol 1g for 5 days, Day 5 -PT consulted and recommended CIR.  Also recommended wheelchair/cushion and hospital bed and  hoyer lift.  -OT and speech consulted -CIR consulted, pending evaluation.   Obesity, BMI 51.7 -Encourage weight loss, diet modifications -Patient should discuss this with his primary care physician  Leukocytosis -Likely secondary to steroids -UA negative for infection, patient does not have cough or shortness of breath, and is afebrile  Bipolar disorder, schizophrenia, ADHD -Continue Cogentin, Wellbutrin, lithium, loxapine, Seroquel, Trilafon, Dimethyl fumarate  Hypertension -Continue clonidine.  Code Status: Full  Family Communication: None at bedside  Disposition Plan: Admitted.  Discharge on 01/23/2014. CIR consult pending.  Time Spent in minutes   25 minutes  Procedures  None  Consults   Neurology CIR   DVT Prophylaxis  Heparin   Lab Results  Component Value Date   PLT 223 01/22/2014    Medications  Scheduled Meds: . benztropine  1 mg Oral QHS  . buPROPion  300 mg Oral Daily  . cloNIDine HCl  0.1 mg Oral QHS  . Dextromethorphan-Quinidine  1 capsule Oral BID  . Dimethyl Fumarate  240 mg Oral BID  . enoxaparin (LOVENOX) injection  90 mg Subcutaneous Q24H  . insulin aspart  0-15 Units Subcutaneous TID WC  . lithium carbonate  450 mg Oral BID  . methylPREDNISolone (SOLU-MEDROL) injection  1,000 mg Intravenous Daily  . OLANZapine Pamoate  300 mg Intramuscular Q14 Days  . perphenazine  4 mg Oral BID  . QUEtiapine  400 mg Oral QHS   Continuous Infusions:  PRN Meds:.acetaminophen, acetaminophen  Antibiotics    Anti-infectives   None      Subjective:   Brandon [name] seen and examined today.  Patient currently not complaining of pain.  States he is feeling ok.  He is very sleepy.  Denies shortness of breath, chest pain.  Still has weakness in the left upper extremity.   Objective:   Filed Vitals:   01/21/14 1827 01/21/14 2104 01/22/14 0135 01/22/14 0633  BP: 146/86 157/2 160/87 156/75  Pulse: 78 81 89 75  Temp: 98.4 F (36.9 C) 98.2 F (36.8 C)  98.1 F (36.7 C) 97.9 F (36.6 C)  TempSrc: Oral Oral Oral Oral  Resp: 18 20 20 18   Height:      Weight:      SpO2: 98% 92% 98% 98%    Wt Readings from Last 3 Encounters:  01/18/14 177.3 kg (390 lb 14 oz)  12/12/13 176.903 kg (390 lb)  11/18/13 176.449 kg (389 lb)    No intake or output data in the 24 hours ending 01/22/14 0954  Exam  General: Well developed, well nourished, NAD  HEENT: NCAT, mucous membranes moist.   Cardiovascular: S1 S2 auscultated, regular rate and rhythm.  Respiratory: Clear to auscultation bilaterally with equal chest rise  Abdomen: Soft, Obese, nontender, nondistended, + bowel sounds  Extremities: warm dry without cyanosis clubbing or edema  Neuro: AAO x 3, no new focal deficits  Skin: Without rashes exudates or nodules  Data Review   Micro Results No results found for this or any previous visit (from the past 240 hour(s)).  Radiology Reports Ct Head Wo Contrast  01/18/2014   CLINICAL DATA:  Multiple falls. Non ambulatory with left frontal headaches. History of multiple sclerosis.  EXAM: CT HEAD WITHOUT CONTRAST  TECHNIQUE: Contiguous axial images were obtained from the base of the skull through the vertex without intravenous contrast.  COMPARISON:  Head CT 07/02/2011.  FINDINGS: The patient's head is tilted on the CT gantry.  There is no evidence of acute intracranial hemorrhage, mass lesion, brain edema or extra-axial fluid collection. The ventricles and subarachnoid spaces are prominent for age but stable. There is no CT evidence of acute cortical infarction. There is stable mild nonspecific periventricular and subcortical white matter disease.  The visualized paranasal sinuses, mastoid air cells and middle ears are clear. The calvarium is intact.  IMPRESSION: Stable atrophy and periventricular/subcortical white matter disease. No acute intracranial findings identified.   Electronically Signed   By: Roxy Horseman M.D.   On: 01/18/2014 11:32   Mr  Laqueta Jean ZO Contrast  01/19/2014   CLINICAL DATA:  Multiple scleroses versus PML.  EXAM: MRI HEAD WITHOUT AND WITH CONTRAST  MRI CERVICAL SPINE WITHOUT AND WITH CONTRAST  TECHNIQUE: Multiplanar, multiecho pulse sequences of the brain and surrounding structures, and cervical spine, to include the craniocervical junction and cervicothoracic junction, were obtained without and with intravenous contrast.  CONTRAST:  20 cc MultiHance  COMPARISON:  CT of the head January 18, 2014  FINDINGS: MRI HEAD FINDINGS  Large body habitus results in decreased overall signal to noise ratio.  Patchy reduced diffusion with corresponding low ADC values in the right posterior frontal and anterior parietal white matter. Confluent reduced diffusion  with T2 shine through in right frontal, left temporal periventricular white matter. Lesion show corresponding bright T2 signal. There are additional far greater than 10 supratentorial and at least 1 at left inferior cerebellar white matter lesion, somewhat confluent in the supratentorial brain, many of which radiate from the periventricular margin. Mild enhancement associated the right frontal subacute changes. Lesion show low T1 signal suggesting black holes of demyelination. No midline shift or mass effect. No susceptibility artifact to suggest hemorrhage.  Moderate ventriculomegaly, likely on the basis of global parenchymal brain volume loss as there is commensurate enlargement of cerebral sulci and cerebellar folia. No abnormal extra-axial fluid collections. Normal major intracranial vascular flow voids seen at the skull base. No abnormal leptomeningeal nor extra-axial masses.  Ocular globes and orbital contents are nonsuspicious though not tailored for evaluation and, optic neuritis could not be excluded on the basis of this examination. No paranasal sinus air-fluid levels. Mastoid air cells appear well-aerated. No abnormal sellar expansion. No cerebellar tonsillar ectopia. No suspicious  calvarial bone marrow signal.  MRI CERVICAL SPINE FINDINGS  Large body habitus resultant noisy image quality, in addition, this is a mild to moderately motion degraded examination. Multiple sequences re-attempted without image quality improvement.  Cervical vertebral bodies and posterior elements are intact and aligned with straightened cervical lordosis. Intervertebral discs demonstrate normal morphology and signal characteristics. No abnormal osseous or intradiscal enhancement.  Cervical spinal cord appears normal morphology and signal characteristics, motion degrades sensitivity for subtle parenchymal signal abnormality. No abnormal cord, leptomeningeal nor epidural enhancement. Included prevertebral and paraspinal soft tissues are nonsuspicious.  No significant disc bulge, canal stenosis or neural foraminal narrowing at any cervical level.  IMPRESSION: MRI of the brain: Probable hyperacute right frontoparietal demyelination a pattern suggesting tumefactive multiple sclerosis, in a background of acute to subacute supra and infratentorial demyelination. Minimal enhancement associated with the subacute changes in the right frontal lobe. Moderate parenchymal brain volume loss for age.  No definite findings of PML though, recommend correlation with immune status and history of Tsyabri medication.  MRI of the cervical spine: Habitus limited examination. No convincing evidence of demyelination within the cervical spinal cord though habitus and motion degrades sensitivity.  Straightened cervical lordosis without acute fracture, malalignment or neural compressive changes.   Electronically Signed   By: Awilda Metroourtnay  Bloomer   On: 01/19/2014 01:57   Mr Cervical Spine W Wo Contrast  01/19/2014   CLINICAL DATA:  Multiple scleroses versus PML.  EXAM: MRI HEAD WITHOUT AND WITH CONTRAST  MRI CERVICAL SPINE WITHOUT AND WITH CONTRAST  TECHNIQUE: Multiplanar, multiecho pulse sequences of the brain and surrounding structures, and  cervical spine, to include the craniocervical junction and cervicothoracic junction, were obtained without and with intravenous contrast.  CONTRAST:  20 cc MultiHance  COMPARISON:  CT of the head January 18, 2014  FINDINGS: MRI HEAD FINDINGS  Large body habitus results in decreased overall signal to noise ratio.  Patchy reduced diffusion with corresponding low ADC values in the right posterior frontal and anterior parietal white matter. Confluent reduced diffusion with T2 shine through in right frontal, left temporal periventricular white matter. Lesion show corresponding bright T2 signal. There are additional far greater than 10 supratentorial and at least 1 at left inferior cerebellar white matter lesion, somewhat confluent in the supratentorial brain, many of which radiate from the periventricular margin. Mild enhancement associated the right frontal subacute changes. Lesion show low T1 signal suggesting black holes of demyelination. No midline shift or mass effect. No susceptibility  artifact to suggest hemorrhage.  Moderate ventriculomegaly, likely on the basis of global parenchymal brain volume loss as there is commensurate enlargement of cerebral sulci and cerebellar folia. No abnormal extra-axial fluid collections. Normal major intracranial vascular flow voids seen at the skull base. No abnormal leptomeningeal nor extra-axial masses.  Ocular globes and orbital contents are nonsuspicious though not tailored for evaluation and, optic neuritis could not be excluded on the basis of this examination. No paranasal sinus air-fluid levels. Mastoid air cells appear well-aerated. No abnormal sellar expansion. No cerebellar tonsillar ectopia. No suspicious calvarial bone marrow signal.  MRI CERVICAL SPINE FINDINGS  Large body habitus resultant noisy image quality, in addition, this is a mild to moderately motion degraded examination. Multiple sequences re-attempted without image quality improvement.  Cervical vertebral  bodies and posterior elements are intact and aligned with straightened cervical lordosis. Intervertebral discs demonstrate normal morphology and signal characteristics. No abnormal osseous or intradiscal enhancement.  Cervical spinal cord appears normal morphology and signal characteristics, motion degrades sensitivity for subtle parenchymal signal abnormality. No abnormal cord, leptomeningeal nor epidural enhancement. Included prevertebral and paraspinal soft tissues are nonsuspicious.  No significant disc bulge, canal stenosis or neural foraminal narrowing at any cervical level.  IMPRESSION: MRI of the brain: Probable hyperacute right frontoparietal demyelination a pattern suggesting tumefactive multiple sclerosis, in a background of acute to subacute supra and infratentorial demyelination. Minimal enhancement associated with the subacute changes in the right frontal lobe. Moderate parenchymal brain volume loss for age.  No definite findings of PML though, recommend correlation with immune status and history of Tsyabri medication.  MRI of the cervical spine: Habitus limited examination. No convincing evidence of demyelination within the cervical spinal cord though habitus and motion degrades sensitivity.  Straightened cervical lordosis without acute fracture, malalignment or neural compressive changes.   Electronically Signed   By: Awilda Metro   On: 01/19/2014 01:57    CBC  Recent Labs Lab 01/18/14 1025 01/19/14 0452 01/20/14 0615 01/22/14 0316  WBC 6.9 15.0* 16.5* 17.0*  HGB 13.5 14.0 13.8 13.7  HCT 42.2 43.1 44.1 41.8  PLT 194 237 199 223  MCV 79.3 78.4 81.5 79.9  MCH 25.4* 25.5* 25.5* 26.2  MCHC 32.0 32.5 31.3 32.8  RDW 14.5 14.1 14.8 14.8  LYMPHSABS 1.7  --   --   --   MONOABS 0.4  --   --   --   EOSABS 0.3  --   --   --   BASOSABS 0.0  --   --   --     Chemistries   Recent Labs Lab 01/18/14 1025 01/19/14 0452  NA 142 137  K 4.2 4.3  CL 103 100  CO2 27 18*  GLUCOSE 87  237*  BUN 12 14  CREATININE 0.88 0.78  CALCIUM 9.4 9.7  AST 18  --   ALT 36  --   ALKPHOS 96  --   BILITOT 0.4  --    ------------------------------------------------------------------------------------------------------------------ estimated creatinine clearance is 245.6 ml/min (by C-G formula based on Cr of 0.78). ------------------------------------------------------------------------------------------------------------------  Recent Labs  01/20/14 0615  HGBA1C 6.1*   ------------------------------------------------------------------------------------------------------------------ No results found for this basename: CHOL, HDL, LDLCALC, TRIG, CHOLHDL, LDLDIRECT,  in the last 72 hours ------------------------------------------------------------------------------------------------------------------ No results found for this basename: TSH, T4TOTAL, FREET3, T3FREE, THYROIDAB,  in the last 72 hours ------------------------------------------------------------------------------------------------------------------ No results found for this basename: VITAMINB12, FOLATE, FERRITIN, TIBC, IRON, RETICCTPCT,  in the last 72 hours  Coagulation profile No results found for this  basename: INR, PROTIME,  in the last 168 hours  No results found for this basename: DDIMER,  in the last 72 hours  Cardiac Enzymes No results found for this basename: CK, CKMB, TROPONINI, MYOGLOBIN,  in the last 168 hours ------------------------------------------------------------------------------------------------------------------ No components found with this basename: POCBNP,     Toby Breithaupt D.O. on 01/22/2014 at 9:54 AM  Between 7am to 7pm - Pager - [phone]  After 7pm go to [email or web address] - password [email or web address]  And look for the night coverage person covering for me after hours  [facility] Office  [phone]

## 2014-01-23 ENCOUNTER — Telehealth: Payer: Self-pay | Admitting: Neurology

## 2014-01-23 LAB — GLUCOSE, CAPILLARY
Glucose-Capillary: 219 mg/dL — ABNORMAL HIGH (ref 70–99)
Glucose-Capillary: 268 mg/dL — ABNORMAL HIGH (ref 70–99)
Glucose-Capillary: 165 mg/dL — ABNORMAL HIGH (ref 70–99)

## 2014-01-23 NOTE — Progress Notes (Signed)
UR complete.  Diem Dicocco RN, MSN 

## 2014-01-23 NOTE — Progress Notes (Signed)
CARE MANAGEMENT NOTE 01/23/2014  Patient:  Brandon [name], Brandon [name]   Account Number:  [id number]  Date Initiated:  01/19/2014  Documentation initiated by:  Lorne Skeens  Subjective/Objective Assessment:   Patient admitted with MS exacerbation . Receiving IV Solumedrol this admit.  Lives at home with his mother.     Action/Plan:   Will follow for discharge needs pending PT eval and physician orders.   Anticipated DC Date:  01/21/2014   Anticipated DC Plan:  HOME/SELF CARE         Choice offered to / List presented to:     DME arranged  OTHER - SEE COMMENT      DME agency  [facility].        Status of service:  In process, will continue to follow Medicare Important Message given?   (If response is "NO", the following Medicare IM given date fields will be blank) Date Medicare IM given:   Medicare IM given by:   Date Additional Medicare IM given:   Additional Medicare IM given by:    Discharge Disposition:  [place]  Per UR Regulation:  Reviewed for med. necessity/level of care/duration of stay  If discussed at [place] of Stay Meetings, dates discussed:    Comments:  01/23/14 [place], MSN, CM- Received a call from Dr Ree Kida stating that patient was denied by CIR and is currently refusing SNF placement for rehab. Patient is medically cleared for discharge home today.  CM met with patient, who was vague with information but states that his plan is to go home.  Patient states that he has plenty of help from his younger brother, but states that his mother is not physically able to assist. With patient's permission, CM called patient's mother [place] Alkire to verify that plan is for discharge home.  CM placed order for hoyer lift, wheelchair, hospital bed and bedside commode.  [facility] liason was notified of orders and plan to discharge home today.  Mother's contact number was attached to the hospital bed order for delivery arrangements.   CSW was notified of need for transportation home.  Mary with [facility] was notified of orders for HHPT and is willing to take the patient. Joelene Millin with [facility] [employer] [facility]) met with patient and spoke with patient's mother via phone to discuss how patient will be followed by [facility] Case Management services after discharge.  Private duty agency list and [facility] contact information was provided to patient to pass along to his mother for discharge.

## 2014-01-23 NOTE — Progress Notes (Signed)
  Pt to be d/c today to HOME [place]DRESS: [address] [place] [id number]  Pt and family agreeable. Plan transfer via EMS.    Leron Croakassandra Elis Sauber [facility]  [facility]  [address];  [phone] Phone: [phone]

## 2014-01-23 NOTE — Progress Notes (Signed)
Physical Therapy Treatment Patient Details Name: Agnes LawrenceRaymond S Calligan MRN: [id number] DOB: 1990-12-31 Today's Date: 01/23/2014    History of Present Illness 23 y.o. male [facility] with left sided weakness and multiple falls at home.  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 Pt with significant PMHx of ADHD, HTN, schizophrenia, bipolar disorder, obesity, and MS (looks like he was being followed by Dr. Anne HahnWillis as an OP).      PT Comments    Pt pleasant and awake on arrival. On arrival pt with glasses only on right eye with them crooked across face and pt unaware. When cued to correct glasses onto ears pt required cues x 3 with increased time to correctly position glasses. Pt with inattention and decreased awareness of Left side as well as no active motion of LLE today with total assist to position limb at all times. Did not attempt standing today secondary to poor sitting balance, body habitus and inattention. Pt with maximove to chair and nurse tech present and educated end of session. Pt tolerating increased activity and would benefit from further therapy given pt size and significant Left sided hemiplegia. Will follow.   Follow Up Recommendations  CIR;Supervision/Assistance - 24 hour     Equipment Recommendations  Wheelchair (measurements PT);Wheelchair cushion (measurements PT);Hospital bed;Other (comment)    Recommendations for Other Services       Precautions / Restrictions Precautions Precautions: Fall Precaution Comments: pt presenting with left hemiplegia, h/o multiple falls at home PTA, inattention    Mobility  Bed Mobility Overal bed mobility: Needs Assistance;+2 for physical assistance Bed Mobility: Rolling;Sidelying to Sit;Sit to Sidelying Rolling: +2 for physical assistance;Mod assist;Max assist (mod roll left, max right) Sidelying to sit: Max assist;+2 for physical assistance   Sit to supine: +2 for physical  assistance;Max assist   General bed mobility comments: max cues for sequence with use of hooking legs to bring LLE off bed. pt with decreased attention and awareness with assist of 2 people for all transitions. Sat EOB then returned to supine to position lift pad for transfer to chair  Transfers                 General transfer comment: bed to chair transfer performed via maxi move with 2 person assist and max cueing for hand placement, pt attempting to reach with RUE for parts of lift and not attending to LUE when lift began to pinch hand   Ambulation/Gait                 Stairs            Wheelchair Mobility    Modified Rankin (Stroke Patients Only) Modified Rankin (Stroke Patients Only) Pre-Morbid Rankin Score: Slight disability Modified Rankin: Severe disability     Balance Overall balance assessment: Needs assistance;History of Falls Sitting-balance support: Feet supported;Single extremity supported Sitting balance-Leahy Scale: Poor Sitting balance - Comments: EOB grossly 9 min with pt with tendency for left posterior lean particularly when RUE on rail, with RUE in lap able to not push to left but keeps head in left lateral flexion with left rotation toward floor. With max assist to position head and trunk in midline pt unable to maintain even seconds without assist and could not state his body/spatial relationship Postural control: Posterior lean;Left lateral lean  Cognition Arousal/Alertness: Awake/alert Behavior During Therapy: Flat affect Overall Cognitive Status: No family/caregiver present to determine baseline cognitive functioning Area of Impairment: Attention;Following commands;Awareness;Problem solving   Current Attention Level: Focused Memory: Decreased short-term memory Following Commands: Follows one step commands inconsistently;Follows one step commands with increased time     Problem Solving: Slow  processing;Decreased initiation;Difficulty sequencing;Requires tactile cues;Requires verbal cues General Comments: slow processing and when asking about body/space relationship pt just stated his "location" as "in the hospital" no matter how question phrased    Exercises      General Comments        Pertinent Vitals/Pain No pain    Home Living                      Prior Function            PT Goals (current goals can now be found in the care plan section) Progress towards PT goals: Progressing toward goals (slowly')    Frequency       PT Plan Current plan remains appropriate    Co-evaluation             End of Session Equipment Utilized During Treatment: Gait belt Activity Tolerance: Patient tolerated treatment well Patient left: in chair;with call bell/phone within reach;with chair alarm set;with nursing/sitter in room     Time: 1610-9604 PT Time Calculation (min): 32 min  Charges:  $Therapeutic Activity: 8-22 mins $Neuromuscular Re-education: 8-22 mins                    G Codes:      Delorse Lek 30-Jan-2014, 11:05 AM Delaney Meigs, PT [phone]

## 2014-01-23 NOTE — Progress Notes (Signed)
Speech Language Pathology Treatment: Dysphagia;Cognitive-Linquistic  Patient Details Name: Brandon [name] MRN: [id number] DOB: 1990/11/27 Today's Date: 01/23/2014 Time: 0950-1005 SLP Time Calculation (min): 15 min  Assessment / Plan / Recommendation Clinical Impression  Skilled treatment today focused on swallowing and cognition/ dysarthria. Pt demonstrated no s/s of aspiration with thin liquids or regular foods; however he did have some anterior loss on the left side- continued reduced L side labial strength/ movement but appears that sensation is intact. Max verbal cues provided to encourage small bites/ sips. Spoke with RN who reported he had one choking incident over the weekend on a piece of meat. Given these findings he may be at risk of aspiration during meals especially given current cognitive status with slowed processing and reduced sustained attention. Recommend continuing dysphagia 3 diet and providing full supervision during meals to provide cues for small bites/ sips and to aide in feeding if needed. Cognitive/ dysarthria treatment- provided compensatory strategy training for dysarthria. During this he demonstrated reduced sustained attention to conversation, at times not responding; repetition of prompts and shortening length of verbal prompts were helpful in aiding his understanding. He was able to repeat sentences with max cues from therapist to demonstrate speech intelligibility strategies- slowed speech, speak louder, over-enunciate. Baseline cognition still unclear. ST will continue to follow for swallow, speech, and cognitive tx.       HPI HPI: 23 y/o male with PMH of RR-MS since 2012, comes in with left hemiparesis, left sided numbness and left face weakness since approximately 01/03/14. MRI brain and cervical spine with and without contrast with no signs of PML but concerning for probable hyperacute right frontoparietal demyelination a pattern suggesting tumefactive multiple  sclerosis which is a very aggressive form of MS.    Pertinent Vitals n/a  SLP Plan  Continue with current plan of care    Recommendations Diet recommendations: Dysphagia 3 (mechanical soft);Thin liquid Liquids provided via: Cup;Straw Medication Administration: Whole meds with puree Supervision: Patient able to self feed;Full supervision/cueing for compensatory strategies Compensations: Slow rate;Small sips/bites;Check for pocketing;Check for anterior loss Postural Changes and/or Swallow Maneuvers: Seated upright 90 degrees;Upright 30-60 min after meal              Oral Care Recommendations: Oral care BID Follow up Recommendations: Inpatient Rehab;24 hour supervision/assistance Plan: Continue with current plan of care    GO     Metro Kung, MA, CCC-SLP 01/23/2014, 10:20 AM

## 2014-01-23 NOTE — Telephone Encounter (Signed)
Events noted, I agree that the patient is be seen through this office as soon as possible following the hospitalization. He clearly is not responding adequately to Tecfidera, likely need to move on to Tysabri if possible.

## 2014-01-23 NOTE — Telephone Encounter (Signed)
[name] stated that patient  is still at [facility] 4th floor rm 2 and not able to walk,has lost complete control of left side, slurring and possible dysplasia. The hospitalist  said that patietn  has advanced stages of MS and the Tecfidera is not working, will be needing injections(not sure of name per mother) and will need to start right awayThey did r/o stroke.

## 2014-01-23 NOTE — Progress Notes (Signed)
Occupational Therapy Treatment Patient Details Name: Brandon [name] MRN: [id number] DOB: [birth year] Today's Date: 01/23/2014    History of present illness 23 y.o. male admitted to [facility] on 01/18/14 with left sided weakness and multiple falls at home.  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 Pt with significant PMHx of ADHD, HTN, schizophrenia, bipolar disorder, obesity, and MS (looks like he was being followed by Dr. Anne Hahn as an OP).     OT comments  Pt not progressing toward goals. Do not think pt is agreeable to go to SNF, so plan is home. Recommending HHOT upon d/c.   Follow Up Recommendations  SNF;Supervision/Assistance - 24 hour    Equipment Recommendations  Hospital bed;Wheelchair (measurements OT)    Recommendations for Other Services      Precautions / Restrictions Precautions Precautions: Fall Precaution Comments: pt presenting with left hemiplegia, h/o multiple falls at home PTA, inattention Restrictions Weight Bearing Restrictions: No       Mobility Bed Mobility Overal bed mobility: Needs Assistance;+2 for physical assistance Bed Mobility: Rolling;Sidelying to Sit;Sit to Supine Rolling: Min assist;+2 for physical assistance;Mod assist Sidelying to sit: Max assist;+2 for physical assistance   Sit to supine: +2 for physical assistance;Max assist   General bed mobility comments: More assistance for rolling at beginning of session. Min A for rolling with only one person towards end of session. Assistance with left leg to return to supine position. Assistance with hooking legs and for trunk when trying to go from supine/sidelying to sitting position.  Transfers                 General transfer comment: not assessed    Balance Overall balance assessment: Needs assistance Sitting-balance support:  (LUE in pt's lap) Sitting balance-Leahy Scale: Poor Sitting balance - Comments: Pt sat EOB to perform ADLs. Pt varying  with needs of assist for balance from Min guard-Max A. Pt requiring more assistance for balance when participating in task. Postural control: Left lateral lean                         ADL Overall ADL's : Needs assistance/impaired     Grooming: Wash/dry face;Oral care;Brushing hair;Sitting;Maximal assistance (heavy assist for balance EOB)   Upper Body Bathing: Sitting;Maximal assistance (washed armpits)-cues/assistance to move gown to wash under arms and assist for balance.                             General ADL Comments: Pt sat EOB and performed ADLs. Pt with Min guard-Max A for balance sitting EOB-pt with much better balance when not doing task.      Vision                     Perception     Praxis      Cognition   Behavior During Therapy: Flat affect Overall Cognitive Status: No family/caregiver present to determine baseline cognitive functioning Area of Impairment: Attention;Problem solving;Safety/judgement   Current Attention Level: Focused Memory: Decreased short-term memory  Following Commands: Follows one step commands inconsistently;Follows one step commands with increased time Safety/Judgement: Decreased awareness of safety   Problem Solving: Slow processing     Extremity/Trunk Assessment               Exercises     Shoulder Instructions       General Comments  Pertinent Vitals/ Pain       No apparent distress.  Home Living                                          Prior Functioning/Environment              Frequency Min 2X/week     Progress Toward Goals  OT Goals(current goals can now be found in the care plan section)  Progress towards OT goals: Not progressing toward goals - comment  Acute Rehab OT Goals Patient Stated Goal: not stated OT Goal Formulation: With patient Time For Goal Achievement: 02/03/14 Potential to Achieve Goals: Good  Plan Discharge plan needs to be  updated    Co-evaluation                 End of Session     Activity Tolerance Patient tolerated treatment well   Patient Left in bed;with call bell/phone within reach;with bed alarm set   Nurse Communication          Time: 1354-1410 OT Time Calculation (min): 16 min  Charges: OT General Charges $OT Visit: 1 Procedure OT Treatments $Self Care/Home Management : 8-22 mins  Earlie RavelingStraub, Lanetra Hartley L  OTR/L [phone]  01/23/2014, 2:26 PM

## 2014-01-23 NOTE — Progress Notes (Signed)
Pt picked up for discharge by [facility]. No distress noted. Family has belongings with them, along with discharge instructions and medication. Herma Ard RN BSN

## 2014-01-23 NOTE — Progress Notes (Signed)
ED CM received a call from [facility] on [address] concerning patient delayed discharge. Awaiting on hospital bed to be delivered to home for [facility] transport. [facility] contacted, delivery time is 6-10pm as per [place] [phone] ext [phone] at [facility]. Provided the nurses station phone number to notify once bed is delivered.

## 2014-01-23 NOTE — Discharge Summary (Signed)
Physician Discharge Summary  Brandon [name] ZOX:[id number] DOB: May 24, 1991 DOA: 01/18/2014  PCP: Jackie Plum, MD  Admit date: 01/18/2014 Discharge date: 01/23/2014  Time spent: 45 minutes  Recommendations for Outpatient Follow-up:  Patient will be discharged home with home health. Patient as well as his mother did not wish to go to a nursing facility for rehabilitation. They were strongly advised to go to nursing home facility versus home with home health. He will need to follow up with his neurologist within one to 2 weeks of discharge. Patient should also see his primary care physician within one week of discharge. He should continue his medications as prescribed.  Discharge Diagnoses:  Side weakness/possible multiple sclerosis exacerbation Obesity Leukocytosis Bipolar disorder/schizophrenia/ADHD Hypertension  Discharge Condition: Stable  Diet recommendation: Dysphagia 3/ heart healthy diet   Filed Weights   01/18/14 1834  Weight: 177.3 kg (390 lb 14 oz)    History of present illness:  01/18/2014  Brandon [name] is a 23 y.o. Male with suspected MS. Per family at bedside and mother on the phone, he came in with increasing left leg weakness and multiple falls. Patient usually uses a walker but it has been broken. Mother stated that her son has fallen several times. Family originally was able to get him up and back to bed, but the last time he fell they were unable to get him up. They called for an ambulance. The patient fell again after EMS got him up so they took him to the emergency room. Upon admission, patient denied fever, chills, shortness of breath, chest pain.  He follows with Dr. Anne Hahn (neurology) and was last seen on 12/12/2013. In the ER, his head CT was unremarkable. Patient was seen by neurology and MRI was ordered along with steroids for possible MS exacerbation. Labs were remarkably normal  Hospital Course:  Left-sided weakness/possible multiple sclerosis  exacerbation  -Continue Tecfidera, possibly noncompliant. He was on Tysabri.  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 -Neurology consulted, following, and appreciated- recommended Solumedrol 1g for 5 days -PT consulted and recommended CIR. Also recommended wheelchair/cushion and hospital bed and hoyer lift.   -OT and speech consulted  -CIR consulted and recommended SNF.  Patient and mother do not want SNF placement.   -Will discharge patient home with home health.  Obesity, BMI 51.7  -Encourage weight loss, diet modifications  -Patient should discuss this with his primary care physician   Leukocytosis  -Likely secondary to steroids  -UA negative for infection, patient does not have cough or shortness of breath, and is afebrile   Bipolar disorder, schizophrenia, ADHD  -Continue Cogentin, Wellbutrin, lithium, loxapine, Seroquel, Trilafon, Dimethyl fumarate   Hypertension  -Continue clonidine.   Procedures: None  Consultations: Neurology CIR  Discharge Exam: Filed Vitals:   01/23/14 1320  BP: 132/78  Pulse: 81  Temp: 98.5 F (36.9 C)  Resp: 19   Exam  General: Well developed, well nourished, NAD  HEENT: NCAT, mucous membranes moist.  Cardiovascular: S1 S2 auscultated, regular rate and rhythm.  Respiratory: Clear to auscultation bilaterally with equal chest rise  Abdomen: Soft, Obese, nontender, nondistended, + bowel sounds  Extremities: warm dry without cyanosis clubbing or edema  Neuro: AAO x 3, no new focal deficits, patient is slow to respond, but responds appropriately.   Skin: Without rashes exudates or nodules  Discharge Instructions     Medication List         amLODipine 5 MG tablet  Commonly known as:  NORVASC  Take 5 mg by mouth 2 (two) times daily.     benztropine 1 MG tablet  Commonly known as:  COGENTIN  Take 1 mg by mouth at bedtime.     buPROPion 150 MG 24 hr tablet    Commonly known as:  WELLBUTRIN XL  Take 300 mg by mouth daily.     cloNIDine HCl 0.1 MG Tb12 ER tablet  Commonly known as:  KAPVAY  Take 0.1 mg by mouth at bedtime.     Dimethyl Fumarate 240 MG Cpdr  Take 240 mg by mouth 2 (two) times daily. 8am and 8pm     lithium carbonate 450 MG CR tablet  Commonly known as:  ESKALITH  Take 450 mg by mouth 2 (two) times daily.     NUEDEXTA 20-10 MG Caps  Generic drug:  Dextromethorphan-Quinidine  Take 1 capsule by mouth 2 (two) times daily. 8am and 8pm     perphenazine 4 MG tablet  Commonly known as:  TRILAFON  Take 4 mg by mouth 2 (two) times daily.     SEROQUEL XR 400 MG 24 hr tablet  Generic drug:  QUEtiapine  Take 400 mg by mouth at bedtime.     ZYPREXA RELPREVV 300 MG Susr  Generic drug:  OLANZapine Pamoate  Inject 300 mg into the muscle every 14 (fourteen) days.       No Known Allergies Follow-up Information   Follow up with OSEI-BONSU,GEORGE, MD. Schedule an appointment as soon as possible for a visit in 1 week. [facility] followup)    Specialty:  Internal Medicine   Contact information:   [address] [place] [address] [phone]       Follow up with [facility]. Schedule an appointment as soon as possible for a visit in 1 month. (Multiple Sclerosis)    Specialty:  Neurology   Contact information:   [address] [place] [address] [phone]       The results of significant diagnostics from this hospitalization (including imaging, microbiology, ancillary and laboratory) are listed below for reference.    Significant Diagnostic Studies: Ct Head Wo Contrast  01/18/2014   CLINICAL DATA:  Multiple falls. Non ambulatory with left frontal headaches. History of multiple sclerosis.  EXAM: CT HEAD WITHOUT CONTRAST  TECHNIQUE: Contiguous axial images were obtained from the base of the skull through the vertex without intravenous contrast.  COMPARISON:  Head CT  07/02/2011.  FINDINGS: The patient's head is tilted on the CT gantry.  There is no evidence of acute intracranial hemorrhage, mass lesion, brain edema or extra-axial fluid collection. The ventricles and subarachnoid spaces are prominent for age but stable. There is no CT evidence of acute cortical infarction. There is stable mild nonspecific periventricular and subcortical white matter disease.  The visualized paranasal sinuses, mastoid air cells and middle ears are clear. The calvarium is intact.  IMPRESSION: Stable atrophy and periventricular/subcortical white matter disease. No acute intracranial findings identified.   Electronically Signed   By: Roxy Horseman M.D.   On: 01/18/2014 11:32   Mr Laqueta Jean IO Contrast  01/19/2014   CLINICAL DATA:  Multiple scleroses versus PML.  EXAM: MRI HEAD WITHOUT AND WITH CONTRAST  MRI CERVICAL SPINE WITHOUT AND WITH CONTRAST  TECHNIQUE: Multiplanar, multiecho pulse sequences of the brain and surrounding structures, and cervical spine, to include the craniocervical junction and cervicothoracic junction, were obtained without and with intravenous contrast.  CONTRAST:  20 cc MultiHance  COMPARISON:  CT of the head January 18, 2014  FINDINGS: MRI HEAD FINDINGS  Large body habitus results in decreased overall signal to noise ratio.  Patchy reduced diffusion with corresponding low ADC values in the right posterior frontal and anterior parietal white matter. Confluent reduced diffusion with T2 shine through in right frontal, left temporal periventricular white matter. Lesion show corresponding bright T2 signal. There are additional far greater than 10 supratentorial and at least 1 at left inferior cerebellar white matter lesion, somewhat confluent in the supratentorial brain, many of which radiate from the periventricular margin. Mild enhancement associated the right frontal subacute changes. Lesion show low T1 signal suggesting black holes of demyelination. No midline shift or mass  effect. No susceptibility artifact to suggest hemorrhage.  Moderate ventriculomegaly, likely on the basis of global parenchymal brain volume loss as there is commensurate enlargement of cerebral sulci and cerebellar folia. No abnormal extra-axial fluid collections. Normal major intracranial vascular flow voids seen at the skull base. No abnormal leptomeningeal nor extra-axial masses.  Ocular globes and orbital contents are nonsuspicious though not tailored for evaluation and, optic neuritis could not be excluded on the basis of this examination. No paranasal sinus air-fluid levels. Mastoid air cells appear well-aerated. No abnormal sellar expansion. No cerebellar tonsillar ectopia. No suspicious calvarial bone marrow signal.  MRI CERVICAL SPINE FINDINGS  Large body habitus resultant noisy image quality, in addition, this is a mild to moderately motion degraded examination. Multiple sequences re-attempted without image quality improvement.  Cervical vertebral bodies and posterior elements are intact and aligned with straightened cervical lordosis. Intervertebral discs demonstrate normal morphology and signal characteristics. No abnormal osseous or intradiscal enhancement.  Cervical spinal cord appears normal morphology and signal characteristics, motion degrades sensitivity for subtle parenchymal signal abnormality. No abnormal cord, leptomeningeal nor epidural enhancement. Included prevertebral and paraspinal soft tissues are nonsuspicious.  No significant disc bulge, canal stenosis or neural foraminal narrowing at any cervical level.  IMPRESSION: MRI of the brain: Probable hyperacute right frontoparietal demyelination a pattern suggesting tumefactive multiple sclerosis, in a background of acute to subacute supra and infratentorial demyelination. Minimal enhancement associated with the subacute changes in the right frontal lobe. Moderate parenchymal brain volume loss for age.  No definite findings of PML though,  recommend correlation with immune status and history of Tsyabri medication.  MRI of the cervical spine: Habitus limited examination. No convincing evidence of demyelination within the cervical spinal cord though habitus and motion degrades sensitivity.  Straightened cervical lordosis without acute fracture, malalignment or neural compressive changes.   Electronically Signed   By: Awilda Metro   On: 01/19/2014 01:57   Mr Cervical Spine W Wo Contrast  01/19/2014   CLINICAL DATA:  Multiple scleroses versus PML.  EXAM: MRI HEAD WITHOUT AND WITH CONTRAST  MRI CERVICAL SPINE WITHOUT AND WITH CONTRAST  TECHNIQUE: Multiplanar, multiecho pulse sequences of the brain and surrounding structures, and cervical spine, to include the craniocervical junction and cervicothoracic junction, were obtained without and with intravenous contrast.  CONTRAST:  20 cc MultiHance  COMPARISON:  CT of the head January 18, 2014  FINDINGS: MRI HEAD FINDINGS  Large body habitus results in decreased overall signal to noise ratio.  Patchy reduced diffusion with corresponding low ADC values in the right posterior frontal and anterior parietal white matter. Confluent reduced diffusion with T2 shine through in right frontal, left temporal periventricular white matter. Lesion show corresponding bright T2 signal. There are additional far greater than 10 supratentorial and at least 1 at  left inferior cerebellar white matter lesion, somewhat confluent in the supratentorial brain, many of which radiate from the periventricular margin. Mild enhancement associated the right frontal subacute changes. Lesion show low T1 signal suggesting black holes of demyelination. No midline shift or mass effect. No susceptibility artifact to suggest hemorrhage.  Moderate ventriculomegaly, likely on the basis of global parenchymal brain volume loss as there is commensurate enlargement of cerebral sulci and cerebellar folia. No abnormal extra-axial fluid collections.  Normal major intracranial vascular flow voids seen at the skull base. No abnormal leptomeningeal nor extra-axial masses.  Ocular globes and orbital contents are nonsuspicious though not tailored for evaluation and, optic neuritis could not be excluded on the basis of this examination. No paranasal sinus air-fluid levels. Mastoid air cells appear well-aerated. No abnormal sellar expansion. No cerebellar tonsillar ectopia. No suspicious calvarial bone marrow signal.  MRI CERVICAL SPINE FINDINGS  Large body habitus resultant noisy image quality, in addition, this is a mild to moderately motion degraded examination. Multiple sequences re-attempted without image quality improvement.  Cervical vertebral bodies and posterior elements are intact and aligned with straightened cervical lordosis. Intervertebral discs demonstrate normal morphology and signal characteristics. No abnormal osseous or intradiscal enhancement.  Cervical spinal cord appears normal morphology and signal characteristics, motion degrades sensitivity for subtle parenchymal signal abnormality. No abnormal cord, leptomeningeal nor epidural enhancement. Included prevertebral and paraspinal soft tissues are nonsuspicious.  No significant disc bulge, canal stenosis or neural foraminal narrowing at any cervical level.  IMPRESSION: MRI of the brain: Probable hyperacute right frontoparietal demyelination a pattern suggesting tumefactive multiple sclerosis, in a background of acute to subacute supra and infratentorial demyelination. Minimal enhancement associated with the subacute changes in the right frontal lobe. Moderate parenchymal brain volume loss for age.  No definite findings of PML though, recommend correlation with immune status and history of Tsyabri medication.  MRI of the cervical spine: Habitus limited examination. No convincing evidence of demyelination within the cervical spinal cord though habitus and motion degrades sensitivity.  Straightened  cervical lordosis without acute fracture, malalignment or neural compressive changes.   Electronically Signed   By: Awilda Metroourtnay  Bloomer   On: 01/19/2014 01:57    Microbiology: No results found for this or any previous visit (from the past 240 hour(s)).   Labs: Basic Metabolic Panel:  Recent Labs Lab 01/18/14 1025 01/19/14 0452  NA 142 137  K 4.2 4.3  CL 103 100  CO2 27 18*  GLUCOSE 87 237*  BUN 12 14  CREATININE 0.88 0.78  CALCIUM 9.4 9.7   Liver Function Tests:  Recent Labs Lab 01/18/14 1025  AST 18  ALT 36  ALKPHOS 96  BILITOT 0.4  PROT 7.5  ALBUMIN 4.0   No results found for this basename: LIPASE, AMYLASE,  in the last 168 hours No results found for this basename: AMMONIA,  in the last 168 hours CBC:  Recent Labs Lab 01/18/14 1025 01/19/14 0452 01/20/14 0615 01/22/14 0316  WBC 6.9 15.0* 16.5* 17.0*  NEUTROABS 4.4  --   --   --   HGB 13.5 14.0 13.8 13.7  HCT 42.2 43.1 44.1 41.8  MCV 79.3 78.4 81.5 79.9  PLT 194 237 199 223   Cardiac Enzymes: No results found for this basename: CKTOTAL, CKMB, CKMBINDEX, TROPONINI,  in the last 168 hours BNP: BNP (last 3 results) No results found for this basename: PROBNP,  in the last 8760 hours CBG:  Recent Labs Lab 01/22/14 1141 01/22/14 1656 01/22/14 2159 01/23/14 [id number]  01/23/14 1158  GLUCAP 221* 286* 405* 219* 268*    Signed:  Edsel Petrin  [facility] 01/23/2014, 2:12 PM

## 2014-01-23 NOTE — Progress Notes (Signed)
Rehab admissions -  We have been following pt's case and I spoke with both PT and OT earlier this am. Pt was able to get out of bed dependently with hoyer lift this am with PT and per RN, was requesting to return to bed after sitting up for 10 minutes. See PT note for further details.  I met with pt later in am who was still sitting up in chair and he was not able to wake up to my interactions.   I then spoke with rehab team who stated that pt is not demonstrating adequate activity tolerance for 3 hours of intensive acute rehab. I spoke with Dr. Ree Kida about pt's case and our recommendation for skilled nursing facility as pt needs heavy assistance with all mobility and self care tasks (with cognitive issues as well).  I called pt's mother to share our recommendation for SNF in light of pt's poor activity tolerance and mother stated they will bring the pt home. I told pt's mother that social worker would now follow up with pt/family for discharge needs.  I then met with pt at 1320 who was now alert in the chair. I explained that our rehab team feels he doesn't meet the criteria for acute inpatient rehab based on his current activity tolerance. He is agreeable to returning home with family support.  I updated PT, OT and RN as well. I will now sign off pt's case.  Please call me with any questions. Thanks.  Nanetta Batty, PT Rehabilitation Admissions Coordinator [phone]

## 2014-01-23 NOTE — Progress Notes (Signed)
Bed delivered to the home; ambulance transport called for discharge to home tonight.

## 2014-01-23 NOTE — Progress Notes (Signed)
CSW assited with EMS transportation arrangements per Pt/ Family request.  No further CSW needs identified.   Naythan Douthit LCSWA  [facility]  4N 1-16;  6N1-16 Phone: [phone]    

## 2014-01-23 NOTE — Telephone Encounter (Signed)
Patient will need to schedule an appointment with [place] as soon as he is out of the hospital. Thanks

## 2014-01-23 NOTE — Discharge Instructions (Signed)

## 2014-01-23 NOTE — Progress Notes (Signed)
Ambulance transport arrived; would not take patient unless hospital bed delivered to patient's home; after hours casemanagement contacted; [facility] will have the equip, bed delivered tonight between 6-9pm to the home;then ambulance dispatch can be called to transport patient for discharge later this evening.

## 2014-01-23 NOTE — Telephone Encounter (Signed)
[name] calling to state that patient is currently in hospital since he lost control of his left side, upon speaking with neurosurgeon patient's mother states that Tecfidera isn't helping and that injections may be needed, but a follow up appointment must be scheduled first. Please return call and advise.

## 2014-01-23 NOTE — Telephone Encounter (Signed)
Informed mother per Ms Dutch Quint note below and she verbalized understanding

## 2014-01-24 NOTE — Telephone Encounter (Signed)
  I called the patient, talked with the mother. The patient has been in the hospital with a severe MS exacerbation while on Tecfidera. The patient has prominent left hemiparesis, now unable to walk. We will get him in the office for revisit, and try to convert him to Tysabri at that point. I'll get a revisit.  MRI brain 01/19/2014:  IMPRESSION:  MRI of the brain: Probable hyperacute right frontoparietal  demyelination a pattern suggesting tumefactive multiple sclerosis,  in a background of acute to subacute supra and infratentorial  demyelination. Minimal enhancement associated with the subacute  changes in the right frontal lobe. Moderate parenchymal brain volume  loss for age.  No definite findings of PML though, recommend correlation with  immune status and history of Tsyabri medication.

## 2014-01-24 NOTE — Telephone Encounter (Signed)
[name] called stating the Mcdaniel was discharged on yesterday (01-23-2014) but was unable to walk, so EMS bought the patient home.  [name] stated since Coel is unable to walk, he is unable to come into office. Patient wants to know if Denise could receive Tysabri at home.

## 2014-01-24 NOTE — Telephone Encounter (Signed)
Spoke to the mother at length and tried to explain that tysabri cannot be administered at home.  Also tried to relay the process involved in getting approval for the drug, she wanted know why the hospital didn't administer it.   The patient is not able to ambulate at this point, but has been given steroids for exacerbation. She also said patient had imaging done at hospital.   I relayed that the doctor would call to explain in more detail about changing to Tysabri from [place].

## 2014-01-25 NOTE — ED Provider Notes (Signed)
Medical screening examination/treatment/procedure(s) were conducted as a shared visit with non-physician practitioner(s) and myself.  I personally evaluated the patient during the encounter.   EKG Interpretation   Date/Time:  Wednesday January 18 2014 10:01:48 EDT Ventricular Rate:  62 PR Interval:  128 QRS Duration: 97 QT Interval:  402 QTC Calculation: 408 R Axis:   34 Text Interpretation:  Sinus rhythm `no acute change compared w ecg 01/29/11  Confirmed by Denton Lank  MD, Caryn Bee ([id number]) on 01/18/2014 10:25:47 AM      Pt with hx psychiatric illness, and ?hx MS, presents w increased left sided weakness, and inability to walk in past 24 hrs. No neck/back pain. No fevers. Chest cta. abd soft, obese nt. Spine nt. Mild left sided weakness.   Suzi Roots, MD 01/25/14 [phone]

## 2014-02-01 ENCOUNTER — Encounter: Payer: Self-pay | Admitting: Neurology

## 2014-02-01 ENCOUNTER — Ambulatory Visit (INDEPENDENT_AMBULATORY_CARE_PROVIDER_SITE_OTHER): Payer: Medicaid Other | Admitting: Neurology

## 2014-02-01 VITALS — BP 118/80 | HR 84

## 2014-02-01 DIAGNOSIS — G35 Multiple sclerosis: Secondary | ICD-10-CM

## 2014-02-01 DIAGNOSIS — R269 Unspecified abnormalities of gait and mobility: Secondary | ICD-10-CM

## 2014-02-01 DIAGNOSIS — Z5181 Encounter for therapeutic drug level monitoring: Secondary | ICD-10-CM

## 2014-02-01 NOTE — Patient Instructions (Signed)

## 2014-02-01 NOTE — Progress Notes (Signed)
Reason for visit: Multiple sclerosis, mobility assessment  Brandon [name] is an 23 y.o. male  History of present illness:  Brandon [name] is a 23 year old right-handed black male who comes to this office today for a mobility assessment for a power wheelchair. This patient had been treated with Tecfidera, and he was tolerating medication well. The patient was last seen on 12/12/2013, and he was doing well at that time. He was admitted to the hospital on the 15th of July 2015 with severe weakness involving a left hemiparesis. The family began noting that he was leaning to the left on or around 01/03/2014, but the deficit significantly worsened around the time of admission. He has undergone MRI evaluation of the brain and cervical spinal cord which showed changes consistent with multiple sclerosis. The patient has Medicaid, and an extended care facility was not covered. The patient returned home, but he only has 3 visits of physical and occupational therapy allow. He has a Marketing executive, and a hospital bed with a trapeze bar. The patient has a standard wheelchair that is too wide to get out of his room. The patient is unable to use the wheelchair secondary to severe weakness of the left side, almost plegic on the arm and leg. The patient reports no numbness, visual changes, or cognitive changes following the recent MS attack. The patient is completely nonambulatory, and again he requires a lift to get him out of bed into a chair. The patient remains on Tecfidera. Today, he is using his mother's motorized wheelchair, and he has the cognitive ability and physical ability to use the chair safely. His mother had to rent a wheelchair today so that she too can come into the office.  Past Medical History  Diagnosis Date  . ADHD (attention deficit hyperactivity disorder)   . Hypertension   . Schizophrenia   . Bipolar 1 disorder   . 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 Obesity   . Multiple  sclerosis 05/20/2013    Past Surgical History  Procedure Laterality Date  . None      Family History  Problem Relation Age of Onset  . Diabetes Mother   . ADD / ADHD Brother     Social history:  reports that he has been smoking Cigarettes.  He has been smoking about 0.25 packs per day. He has never used smokeless tobacco. He reports that he uses illicit drugs (Marijuana). He reports that he does not drink alcohol.   No Known Allergies  Medications:  Current Outpatient Prescriptions on File Prior to Visit  Medication Sig Dispense Refill  . amLODipine (NORVASC) 5 MG tablet Take 5 mg by mouth 2 (two) times daily.      . benztropine (COGENTIN) 1 MG tablet Take 1 mg by mouth at bedtime.       Marland Kitchen buPROPion (WELLBUTRIN XL) 150 MG 24 hr tablet Take 300 mg by mouth daily.       . cloNIDine HCl (KAPVAY) 0.1 MG TB12 ER tablet Take 0.1 mg by mouth at bedtime.      Marland Kitchen Dextromethorphan-Quinidine (NUEDEXTA) 20-10 MG CAPS Take 1 capsule by mouth 2 (two) times daily. 8am and 8pm      . Dimethyl Fumarate 240 MG CPDR Take 240 mg by mouth 2 (two) times daily. 8am and 8pm      . lithium carbonate (ESKALITH) 450 MG CR tablet Take 450 mg by mouth 2 (two) times daily.      Marland Kitchen  OLANZapine Pamoate (ZYPREXA RELPREVV) 300 MG SUSR Inject 300 mg into the muscle every 14 (fourteen) days.      Marland Kitchen. perphenazine (TRILAFON) 4 MG tablet Take 4 mg by mouth 2 (two) times daily.       No current facility-administered medications on file prior to visit.    ROS:  Out of a complete 14 system review of symptoms, the patient complains only of the following symptoms, and all other reviewed systems are negative.  Activity change, fatigue Eye discharge Choking Constipation Difficulty urinating, incontinence of bladder Speech difficulty, facial drooping Depression  Blood pressure 118/80, pulse 84, weight 0 lb (0 kg).  Physical Exam  General: The patient is alert and cooperative at the time of the examination. This  patient is markedly obese.  Skin: No significant peripheral edema is noted.   Neurologic Exam  Mental status: The patient is oriented x 3.  Cranial nerves: Facial symmetry is not present. The patient has an asymmetric smile, decreased left nasolabial fold. Speech is normal, no aphasia or dysarthria is noted. Extraocular movements are full. Visual fields are full. Pupils are equal, round, and reactive to light. Discs are flat bilaterally.  Motor: The patient has good strength in the right extremities. The left arm is new plegic, the patient may have slight ability to grip with the left hand, no voluntary movement proximally and arm. The left leg can be extended slightly at the knees, the patient is unable to lift the leg against gravity. Severe distal weakness is seen.  Sensory examination: Soft touch sensation is symmetric on the face, arms, or legs.  Coordination: The patient has good finger-nose-finger and heel-to-shin on the right, unable to perform on the left..  Gait and station: The patient could not be ambulated, wheelchair-bound.  Reflexes: Deep tendon reflexes are symmetric, depressed throughout.   MRI brain and cervical spine 01/19/14:  IMPRESSION:  MRI of the brain: Probable hyperacute right frontoparietal  demyelination a pattern suggesting tumefactive multiple sclerosis,  in a background of acute to subacute supra and infratentorial  demyelination. Minimal enhancement associated with the subacute  changes in the right frontal lobe. Moderate parenchymal brain volume  loss for age.  No definite findings of PML though, recommend correlation with  immune status and history of Tsyabri medication.  MRI of the cervical spine: Habitus limited examination. No  convincing evidence of demyelination within the cervical spinal cord  though habitus and motion degrades sensitivity.  Straightened cervical lordosis without acute fracture, malalignment  or neural compressive  changes.    Assessment/Plan:  One. Multiple sclerosis  2. Gait disorder  The patient has had a recent sudden and significant change in his underlying functional ability. He has a severe left hemiparesis with near plegia on the left arm and leg. He is nonambulatory, and he will require a power wheelchair for mobilization. The patient has the cognitive abilities and his physical abilities to use the chair in a safe and effective manner. He is unable to use a manual wheelchair secondary to a near plegic left upper extremity and left lower extremity. Significant improvement in this left-sided weakness is not expected in the future. The patient is quite obese and quite large, and mobilization is going to be virtually impossible without a motorized wheelchair. The patient will return to this office in about 3-4 months. He will have blood work done today to evaluate for the JC virus antibody. Plans will be made for Tysabri therapy. The patient will go off the Tecfidera.  Marlan Palau MD 02/01/2014 6:54 PM  [facility] [address] [place], [place] [address]  Phone [phone] Fax [phone]

## 2014-02-07 ENCOUNTER — Encounter: Payer: Self-pay | Admitting: Neurology

## 2014-02-07 ENCOUNTER — Telehealth: Payer: Self-pay | Admitting: Neurology

## 2014-02-07 NOTE — Telephone Encounter (Signed)
[name] calling to state that patient has a court appearance coming up and his MS has flared up and he cannot walk, patient's mother wants a letter excusing him from this court appearance due to his condition. Please return call and advise.

## 2014-02-07 NOTE — Telephone Encounter (Signed)
[name] calling to state that patient has a court appearance coming up and his MS has flared up and he cannot walk, patient's mother wants a letter excusing him from this court appearance due to his condition. Please return call and advise.  °

## 2014-02-07 NOTE — Telephone Encounter (Signed)
I will write a letter.

## 2014-02-09 ENCOUNTER — Telehealth: Payer: Self-pay | Admitting: Neurology

## 2014-02-09 NOTE — Telephone Encounter (Signed)
I called back.  Spoke with Marisue IvanLiz.  They wanted to verify the patient will be discontinuing Tecfidera.  Per last OV note, this med will be discontinued.

## 2014-02-09 NOTE — Telephone Encounter (Signed)
Bonita Quin with [facility] @ [phone], questioning if Almyra Brace has been discontinued due to MS progression.  Please call and select RN option and advise.Marland KitchenMarland KitchenThanks

## 2014-02-20 ENCOUNTER — Telehealth: Payer: Self-pay | Admitting: Neurology

## 2014-02-20 NOTE — Telephone Encounter (Signed)
[name] calling with questions about patient's medication, an IV drip, please return call and advise.

## 2014-02-21 NOTE — Telephone Encounter (Signed)
Left message for patient's mother to return my call with further clarification on her son's IV medication.

## 2014-02-23 ENCOUNTER — Telehealth: Payer: Self-pay | Admitting: Neurology

## 2014-02-23 ENCOUNTER — Encounter: Payer: Self-pay | Admitting: Neurology

## 2014-02-23 NOTE — Telephone Encounter (Signed)
JCV antibody titer was negative, okay to proceed with Tysabri.

## 2014-02-23 NOTE — Telephone Encounter (Signed)
I did speak to mother who is POA.  She will call and speak to [facility] re: location for pts inufsions (tysabri).  Brandon [name] will require transportation from EMS transport, since Brandon [name] does not have wheelchair.  Brandon [name] is in the process for eval for power WC via [facility]/evaluation in 04/2014.  Also the letter written by Dr. Anne Hahn, was not needed for jury duty but pt needed for delaying court proceedings due to (trespassing).  Due in court 03/08/14.  Due to condition, not able to attend, (mother asked date not be placed).

## 2014-02-23 NOTE — Telephone Encounter (Signed)
I will write a letter for this purpose. 

## 2014-02-23 NOTE — Telephone Encounter (Signed)
I called and LMVM for home and cell to return call for results (JCV) negative.  Also pt needs to call

## 2014-02-24 ENCOUNTER — Telehealth: Payer: Self-pay | Admitting: *Deleted

## 2014-02-24 NOTE — Telephone Encounter (Signed)
Mother called back and pt has Medicare [id number] since 01/2013.   This is her SS# with C1 on end.

## 2014-02-24 NOTE — Telephone Encounter (Signed)
I spoke with mother.   She was on the phone for Medicare , trying to get the medicare #.  She will call me back.   I gave her the MS Society  #, (they may have foundation to help assist pts).

## 2014-02-24 NOTE — Telephone Encounter (Signed)
I called and LMVM for pt or mother to call me back about medicaid.  (needing pcp/ on medicaid card )[facility] has not seen pt since 11-2013 and NPI was not given to me).

## 2014-02-24 NOTE — Telephone Encounter (Signed)
[name] calling back to state that she spoke with [employer] yesterday and she is ready to move forward with scheduling patient's Tysabri, prefers Wednesdays or Fridays. Please return her call at [phone].

## 2014-02-27 NOTE — Telephone Encounter (Signed)
I called and spoke to [facility] at [facility] [phone],  NPI [id number].  I called and spoke to Angie at [facility] to place pt on cancellation list.  Pt weight could be barrier (hoyer lift limits).  LMVM for angie at [employer] [phone]  concerning insurance (of both medicaid and medicare).  I called and LMVM for Zenia Resides, [facility] about wheelchair sooner eval at other location that can accomodate weight.  [phone]

## 2014-02-27 NOTE — Telephone Encounter (Signed)
Mother called and stated Andrey Campanile, RN could call [facility] and speak with Office Manager De [place] Sink to get NPI #.

## 2014-02-28 ENCOUNTER — Other Ambulatory Visit: Payer: Self-pay | Admitting: Neurology

## 2014-02-28 DIAGNOSIS — G35 Multiple sclerosis: Secondary | ICD-10-CM

## 2014-02-28 NOTE — Telephone Encounter (Signed)
Zenia Resides with [facility] calling back.  I asked about getting pt in for earlier appt relating to wheelchair eval.  Pt is now scheduled with [facility] 04-27-14 at neuro rehab.  She will try to see about this, since pt now has medicare and medicaid.   This may help in getting this done sooner.  (HP Reg).  She will call back.

## 2014-02-28 NOTE — Telephone Encounter (Signed)
I will put orders in for Tysabri.

## 2014-02-28 NOTE — Telephone Encounter (Signed)
I spoke to Angie and gave her the new information on pts insurance Medicare/Medicaid.    Need orders for tysabri.

## 2014-03-01 ENCOUNTER — Telehealth: Payer: Self-pay | Admitting: *Deleted

## 2014-03-01 NOTE — Telephone Encounter (Signed)
Relayed that spoke with mother yesterday about moving up appt for power wheelchair (could get in first week of October at [facility].  Mother would keep appt with [facility] for 04-27-14 and if cancellation to call.  Pt does have hoyer lift at home.

## 2014-03-02 ENCOUNTER — Encounter: Payer: Medicaid Other | Admitting: Dietician

## 2014-03-02 NOTE — Telephone Encounter (Signed)
I called and made appt for pt for his first Tysabri infusion.  03-24-14 at 1200.  They will call and speak to mother as it gets closer.  (? of Hoyer lift pad used in transport).   LM with father of pt, to have mother call me back.

## 2014-03-03 ENCOUNTER — Telehealth: Payer: Self-pay | Admitting: Neurology

## 2014-03-03 NOTE — Telephone Encounter (Signed)
I called Myrene Buddy concerning this patient, I have given verbal orders for physical therapy and for a Presenter, broadcasting.

## 2014-03-03 NOTE — Telephone Encounter (Signed)
Spoke to [place] at [facility] and she relayed that the patient now has medicare, and not just medicaid.  He will now qualify for more PT, she wanted to know if Dr. Anne Hahn will put in an order, or give a verbal order for more PT and also social work, to help him with community resources.  Her number is [phone] and fax [phone].

## 2014-03-03 NOTE — Telephone Encounter (Signed)
I spoke to mother of pt.  Relayed appt  03-24-14 1200 at [facility], I gave her their number.  She will speak to them re: hoyer lift when pt comes to appt.  She will speak with admitting relating to pts insurance/MCR/Medicaid.  She verbalized understanding.   She did get letter from Dr. Anne Hahn about court.

## 2014-03-06 ENCOUNTER — Telehealth: Payer: Self-pay | Admitting: Neurology

## 2014-03-06 NOTE — Telephone Encounter (Signed)
I called Brandon [name], gave verbal orders for physical, occupational, and speech therapy.

## 2014-03-06 NOTE — Telephone Encounter (Signed)
Mark Bias with [employer] @ [phone], requesting verbal order for PT for bed mobility transfer, home exercise, pt and family education.  2 week 2, 3 week 2, starting 8/29.  Also verbal orders for OT and Speech Therapy to eval and treat.  Please call Lesli Albee @ [phone] with orders.

## 2014-03-10 ENCOUNTER — Telehealth: Payer: Self-pay | Admitting: Neurology

## 2014-03-10 NOTE — Telephone Encounter (Signed)
Faxed medication issue communication/order to [facility] on 03/10/14.

## 2014-03-14 ENCOUNTER — Telehealth: Payer: Self-pay | Admitting: Neurology

## 2014-03-14 NOTE — Telephone Encounter (Signed)
Mark PT with [facility] @ [phone], requesting order faxed to obtain longer length hospital bed to [facility] fax # [phone].

## 2014-03-14 NOTE — Telephone Encounter (Signed)
Brandon [name] calling for a prescription for a longer hospital bed for the patient, please advise.

## 2014-03-14 NOTE — Telephone Encounter (Signed)
I will write a prescription for the extended length hospital bed.

## 2014-03-15 ENCOUNTER — Telehealth: Payer: Self-pay | Admitting: Neurology

## 2014-03-15 NOTE — Telephone Encounter (Signed)
I spoke to [place] at [facility].  Asking about date of pt scheduled for tysabri.  Told her 03-24-14 at 1200.  She verbalized understanding.

## 2014-03-15 NOTE — Telephone Encounter (Signed)
LMVM for Angie with [employer]. That I returned call.

## 2014-03-15 NOTE — Telephone Encounter (Signed)
April with [employer] @ [phone] calling Andrey Campanile, RN regarding Tysabri.  Please return call.

## 2014-03-21 ENCOUNTER — Telehealth: Payer: Self-pay | Admitting: Neurology

## 2014-03-21 DIAGNOSIS — R269 Unspecified abnormalities of gait and mobility: Secondary | ICD-10-CM

## 2014-03-21 DIAGNOSIS — G35 Multiple sclerosis: Secondary | ICD-10-CM

## 2014-03-21 NOTE — Telephone Encounter (Signed)
Mark Physical Therapist,  with [facility] @ [phone], requesting order for Bariatric Rolling Walker.  Patient stood yesterday for about 2 minutes, showing improvement.

## 2014-03-21 NOTE — Telephone Encounter (Signed)
I will write a prescription. It will be faxed to [phone].

## 2014-03-21 NOTE — Addendum Note (Signed)
Addended by: Stephanie Acre on: 03/21/2014 05:18 PM   Modules accepted: Orders

## 2014-03-24 ENCOUNTER — Encounter (HOSPITAL_COMMUNITY)
Admission: RE | Admit: 2014-03-24 | Discharge: 2014-03-24 | Disposition: A | Discharge status: Home / Self Care | Payer: Medicare Other | Source: Ambulatory Visit | Attending: Neurology | Admitting: Neurology

## 2014-03-24 ENCOUNTER — Encounter (HOSPITAL_COMMUNITY): Payer: Self-pay

## 2014-03-24 VITALS — BP 123/67 | HR 57 | Temp 97.6°F | Resp 18 | Ht 73.0 in | Wt 291.0 lb

## 2014-03-24 DIAGNOSIS — G35 Multiple sclerosis: Secondary | ICD-10-CM | POA: Diagnosis present

## 2014-03-24 MED ORDER — SODIUM CHLORIDE 0.9 % IV SOLN
INTRAVENOUS | Status: DC
  Administered 2014-03-24: 13:00:00 via INTRAVENOUS

## 2014-03-24 MED ORDER — LORATADINE 10 MG PO TABS
10.0000 mg | ORAL_TABLET | ORAL | Status: DC
  Administered 2014-03-24: 10 mg via ORAL
  Filled 2014-03-24: qty 1

## 2014-03-24 MED ORDER — SODIUM CHLORIDE 0.9 % IV SOLN
300.0000 mg | INTRAVENOUS | Status: DC
  Administered 2014-03-24: 300 mg via INTRAVENOUS
  Filled 2014-03-24: qty 15

## 2014-03-24 MED ORDER — ACETAMINOPHEN 325 MG PO TABS
650.0000 mg | ORAL_TABLET | ORAL | Status: DC
  Administered 2014-03-24: 650 mg via ORAL
  Filled 2014-03-24: qty 2

## 2014-03-24 NOTE — Progress Notes (Signed)
Pt. Tolerated 1st Tysabri Infusion, no problems noted, VSS, pt. Stayed 1 hour post- infusion, pt. To follow-up with doctor with any problems.

## 2014-03-24 NOTE — Discharge Instructions (Signed)
Natalizumab injection °What is this medicine? °NATALIZUMAB (na ta LIZ you mab) is used to treat relapsing multiple sclerosis. This drug is not a cure. It is also used to treat Crohn's disease. °This medicine may be used for other purposes; ask your health care provider or pharmacist if you have questions. °COMMON BRAND NAME(S): Tysabri °What should I tell my health care provider before I take this medicine? °They need to know if you have any of these conditions: °-immune system problems °-progressive multifocal leukoencephalopathy (PML) °-an unusual or allergic reaction to natalizumab, other medicines, foods, dyes, or preservatives °-pregnant or trying to get pregnant °-breast-feeding °How should I use this medicine? °This medicine is for infusion into a vein. It is given by a health care professional in a hospital or clinic setting. °A special MedGuide will be given to you by the pharmacist with each prescription and refill. Be sure to read this information carefully each time. °Talk to your pediatrician regarding the use of this medicine in children. This medicine is not approved for use in children. °Overdosage: If you think you have taken too much of this medicine contact a poison control center or emergency room at once. °NOTE: This medicine is only for you. Do not share this medicine with others. °What if I miss a dose? °It is important not to miss your dose. Call your doctor or health care professional if you are unable to keep an appointment. °What may interact with this medicine? °-azathioprine °-cyclosporine °-interferon °-6-mercaptopurine °-methotrexate °-steroid medicines like prednisone or cortisone °-TNF-alpha inhibitors like adalimumab, etanercept, and infliximab °-vaccines °This list may not describe all possible interactions. Give your health care provider a list of all the medicines, herbs, non-prescription drugs, or dietary supplements you use. Also tell them if you smoke, drink alcohol, or use  illegal drugs. Some items may interact with your medicine. °What should I watch for while using this medicine? °Your condition will be monitored carefully while you are receiving this medicine. Visit your doctor for regular check ups. Tell your doctor or healthcare professional if your symptoms do not start to get better or if they get worse. °Stay away from people who are sick. Call your doctor or health care professional for advice if you get a fever, chills or sore throat, or other symptoms of a cold or flu. Do not treat yourself. °In some patients, this medicine may cause a serious brain infection that may cause death. If you have any problems seeing, thinking, speaking, walking, or standing, tell your doctor right away. If you cannot reach your doctor, get urgent medical care. °What side effects may I notice from receiving this medicine? °Side effects that you should report to your doctor or health care professional as soon as possible: °-allergic reactions like skin rash, itching or hives, swelling of the face, lips, or tongue °-breathing problems °-changes in vision °-chest pain °-dark urine °-depression, feelings of sadness °-dizziness °-general ill feeling or flu-like symptoms °-irregular, missed, or painful menstrual periods °-light-colored stools °-loss of appetite, nausea °-muscle weakness °-problems with balance, talking, or walking °-right upper belly pain °-unusually weak or tired °-yellowing of the eyes or skin °Side effects that usually do not require medical attention (report to your doctor or health care professional if they continue or are bothersome): °-aches, pains °-headache °-stomach upset °-tiredness °This list may not describe all possible side effects. Call your doctor for medical advice about side effects. You may report side effects to FDA at 1-800-FDA-1088. °Where should I keep   my medicine? °This drug is given in a hospital or clinic and will not be stored at home. °NOTE: This sheet is  a summary. It may not cover all possible information. If you have questions about this medicine, talk to your doctor, pharmacist, or health care provider. °© 2015, Elsevier/Gold Standard. (2008-08-12 13:33:21) ° °

## 2014-03-29 ENCOUNTER — Telehealth: Payer: Self-pay | Admitting: Neurology

## 2014-03-29 DIAGNOSIS — R269 Unspecified abnormalities of gait and mobility: Secondary | ICD-10-CM

## 2014-03-29 DIAGNOSIS — G35 Multiple sclerosis: Secondary | ICD-10-CM

## 2014-03-29 NOTE — Telephone Encounter (Signed)
Ron Parker, Occupational therapist with [facility] @ [phone] requesting verbal orders for OT.  3 weeks 2, 2 weeks 2, 1 week 1 and PT 3 week 4, 1 week 1.  Patient has had fantastic gains.

## 2014-03-29 NOTE — Telephone Encounter (Signed)
Brandon [name] from [facility] calling to state that they need additional verbage on the order for patient's rolling walker, please return call and advise.

## 2014-03-29 NOTE — Telephone Encounter (Signed)
I called Brandon [name], office is closed, could not talk with Joyce Gross, asked that she call back tomorrow and let me know exactly what verbage she needs to go on the order.

## 2014-03-29 NOTE — Telephone Encounter (Signed)
See phone note: I tried to call but Joyce Gross with [facility] was in a meeting.

## 2014-03-30 NOTE — Telephone Encounter (Signed)
I will rewrite the prescription for the walker, and fax it to the number listed.

## 2014-03-30 NOTE — Telephone Encounter (Signed)
See phone note

## 2014-03-30 NOTE — Telephone Encounter (Signed)
Brandon [name] from [facility] calling again to state that the order needs to say bariatric walker "with wheels" or else the company won't send it with wheels. It can be faxed to [phone] and if you need to contact Brandon [name] you can call [phone].

## 2014-03-30 NOTE — Telephone Encounter (Signed)
I called the therapist, Rosanne Ashing. The patient is making good gains with physical and outpatient therapy, okay to continue.

## 2014-04-05 ENCOUNTER — Telehealth: Payer: Self-pay | Admitting: Neurology

## 2014-04-05 DIAGNOSIS — G8194 Hemiplegia, unspecified affecting left nondominant side: Secondary | ICD-10-CM

## 2014-04-05 DIAGNOSIS — G35 Multiple sclerosis: Secondary | ICD-10-CM

## 2014-04-05 NOTE — Telephone Encounter (Signed)
SEE PHONE NOTE

## 2014-04-05 NOTE — Telephone Encounter (Signed)
I called Rosanne Ashing the therapist, he has requested half bed rails for the bed at home. The patient is progressing nicely, he has ambulated today.

## 2014-04-05 NOTE — Telephone Encounter (Signed)
Rosanne Ashing from [facility] calling to request that an order be sent to [facility] for patient's half rails for the hospital bed, please return call and advise.

## 2014-04-21 ENCOUNTER — Encounter (HOSPITAL_COMMUNITY): Payer: Self-pay

## 2014-04-21 ENCOUNTER — Encounter (HOSPITAL_COMMUNITY)
Admission: RE | Admit: 2014-04-21 | Discharge: 2014-04-21 | Disposition: A | Discharge status: Home / Self Care | Payer: Medicare Other | Source: Ambulatory Visit | Attending: Neurology | Admitting: Neurology

## 2014-04-21 VITALS — BP 136/88 | HR 66 | Temp 97.3°F | Resp 20

## 2014-04-21 DIAGNOSIS — G35 Multiple sclerosis: Secondary | ICD-10-CM | POA: Diagnosis not present

## 2014-04-21 MED ORDER — LORATADINE 10 MG PO TABS
10.0000 mg | ORAL_TABLET | ORAL | Status: DC
  Administered 2014-04-21: 10 mg via ORAL
  Filled 2014-04-21: qty 1

## 2014-04-21 MED ORDER — SODIUM CHLORIDE 0.9 % IV SOLN
300.0000 mg | INTRAVENOUS | Status: DC
  Administered 2014-04-21: 300 mg via INTRAVENOUS
  Filled 2014-04-21: qty 15

## 2014-04-21 MED ORDER — SODIUM CHLORIDE 0.9 % IV SOLN
INTRAVENOUS | Status: DC
  Administered 2014-04-21: 13:00:00 via INTRAVENOUS

## 2014-04-21 MED ORDER — ACETAMINOPHEN 325 MG PO TABS
650.0000 mg | ORAL_TABLET | ORAL | Status: DC
  Administered 2014-04-21: 650 mg via ORAL
  Filled 2014-04-21: qty 2

## 2014-04-21 NOTE — Progress Notes (Signed)
2nd Tysabri infusion is complete.  Pt stayed his 1 hour post infusion observation.  Pt tolerated well.

## 2014-04-25 ENCOUNTER — Telehealth: Payer: Self-pay | Admitting: Neurology

## 2014-04-25 ENCOUNTER — Encounter (HOSPITAL_COMMUNITY): Payer: Medicare Other

## 2014-04-25 NOTE — Telephone Encounter (Signed)
I called patient. The patient is apparently walking fairly well, may not need the wheelchair assessment. I do need to see him back in office at some point.

## 2014-04-25 NOTE — Telephone Encounter (Signed)
Brandon ParkerJim Bell, Physical Therapist with [facility] @ [phone], calling to inform Dr. Anne HahnWillis patient will be discharged next week.  Patient has made fantastic progress and may benefit from out patient therapy in future.

## 2014-04-25 NOTE — Telephone Encounter (Signed)
Dr. Anne Hahn the patient has an appointment with [facility] neuro rehab for wheelchair assessment on 04-28-15 at 0930.  Do you need to see him in an office visit before or after that appointment?

## 2014-04-25 NOTE — Telephone Encounter (Signed)
[name] calling to ask whether patient still needs to come for the wheelchair evaluation on 10/22 since she states that patient is no longer non-ambulatory, please return call and advise.

## 2014-04-25 NOTE — Telephone Encounter (Signed)
I called the patient, we will assess the patient on October 22, if he is doing very well, we may not need to pursue the wheelchair, but I need to see him at any rate. They are to keep the appointment.

## 2014-04-25 NOTE — Telephone Encounter (Signed)
Noted  

## 2014-04-27 ENCOUNTER — Ambulatory Visit: Payer: Medicaid Other | Admitting: Physical Therapy

## 2014-05-22 ENCOUNTER — Encounter (HOSPITAL_COMMUNITY): Admission: RE | Admit: 2014-05-22 | Payer: Medicare Other | Source: Ambulatory Visit

## 2014-05-29 ENCOUNTER — Encounter (HOSPITAL_COMMUNITY): Payer: Medicare Other

## 2014-05-30 ENCOUNTER — Encounter (HOSPITAL_COMMUNITY): Payer: Medicare Other

## 2014-05-31 ENCOUNTER — Encounter (HOSPITAL_COMMUNITY)
Admission: RE | Admit: 2014-05-31 | Discharge: 2014-05-31 | Disposition: A | Discharge status: Home / Self Care | Payer: Medicare Other | Source: Ambulatory Visit | Attending: Neurology | Admitting: Neurology

## 2014-05-31 ENCOUNTER — Encounter (HOSPITAL_COMMUNITY): Payer: Self-pay

## 2014-05-31 VITALS — BP 129/95 | HR 65 | Temp 98.0°F | Resp 18 | Ht 73.0 in | Wt 291.0 lb

## 2014-05-31 DIAGNOSIS — G35 Multiple sclerosis: Secondary | ICD-10-CM | POA: Diagnosis not present

## 2014-05-31 MED ORDER — LORATADINE 10 MG PO TABS
10.0000 mg | ORAL_TABLET | ORAL | Status: AC
  Administered 2014-05-31: 10 mg via ORAL
  Filled 2014-05-31: qty 1

## 2014-05-31 MED ORDER — SODIUM CHLORIDE 0.9 % IV SOLN
INTRAVENOUS | Status: AC
  Administered 2014-05-31: 250 mL via INTRAVENOUS

## 2014-05-31 MED ORDER — SODIUM CHLORIDE 0.9 % IV SOLN
300.0000 mg | INTRAVENOUS | Status: DC
  Administered 2014-05-31: 300 mg via INTRAVENOUS
  Filled 2014-05-31: qty 15

## 2014-05-31 MED ORDER — ACETAMINOPHEN 325 MG PO TABS
650.0000 mg | ORAL_TABLET | ORAL | Status: AC
  Administered 2014-05-31: 650 mg via ORAL
  Filled 2014-05-31: qty 2

## 2014-05-31 NOTE — Progress Notes (Signed)
Uneventful infusion of TYSABRI today. Pt arrived ambulatory accompanied by Mom. Pt discharged accompanied by Mom (in her motor wheelchair) Pt ambulated to hallway to go to [address] entrance to get transportation home

## 2014-06-14 ENCOUNTER — Ambulatory Visit (INDEPENDENT_AMBULATORY_CARE_PROVIDER_SITE_OTHER): Payer: Medicare Other | Admitting: Adult Health

## 2014-06-14 ENCOUNTER — Encounter: Payer: Self-pay | Admitting: Adult Health

## 2014-06-14 VITALS — BP 135/76 | HR 83 | Temp 98.0°F | Ht 73.0 in | Wt 337.0 lb

## 2014-06-14 DIAGNOSIS — R350 Frequency of micturition: Secondary | ICD-10-CM

## 2014-06-14 DIAGNOSIS — Z5181 Encounter for therapeutic drug level monitoring: Secondary | ICD-10-CM

## 2014-06-14 DIAGNOSIS — G35 Multiple sclerosis: Secondary | ICD-10-CM

## 2014-06-14 NOTE — Patient Instructions (Addendum)
Continue Tysabri infusions.  I will check blood work today. I will call you with the results.  If your symptoms worsen or you develop new symptoms please let us know.    Multiple Sclerosis Multiple sclerosis (MS) is a disease of the central nervous system. It leads to the loss of the insulating covering of the nerves (myelin sheath) of your brain. When this happens, brain signals do not get sent properly or may not get sent at all. The age of onset of MS varies.  CAUSES The cause of MS is unknown. However, it is more common in the [place] than in the [place]. RISK FACTORS There is a higher number of women with MS than men. MS is not an illness that is passed down to you from your family members (inherited). However, your risk of MS is higher if you have a relative with MS. SIGNS AND SYMPTOMS  The symptoms of MS occur in episodes or attacks. These attacks may last weeks to months. There may be long periods of almost no symptoms between attacks. The symptoms of MS vary. This is because of the many different ways it affects the central nervous system. The main symptoms of MS include:  Vision problems and eye pain.  Numbness.  Weakness.  Inability to move your arms, hands, feet, or legs (paralysis).  Balance problems.  Tremors. DIAGNOSIS  Your health care provider can diagnose MS with the help of imaging exams and lab tests. These may include specialized X-ray exams and spinal fluid tests. The best imaging exam to confirm a diagnosis of MS is an MRI. TREATMENT  There is no known cure for MS, but there are medicines that can decrease the number and frequency of attacks. Steroids are often used for short-term relief. Physical and occupational therapy may also help. There are also many new alternative or complementary treatments available to help control the symptoms of MS. Ask your health care provider if any of these other options are right for you. HOME CARE  INSTRUCTIONS   Take medicines as directed by your health care provider.  Exercise as directed by your health care provider. SEEK MEDICAL CARE IF: You begin to feel depressed. SEEK IMMEDIATE MEDICAL CARE IF:  You develop paralysis.  You have problems with bladder, bowel, or sexual function.  You develop mental changes, such as forgetfulness or mood swings.  You have a period of uncontrolled movements (seizure). Document Released: 06/20/2000 Document Revised: 06/28/2013 Document Reviewed: 02/28/2013 [facility] Patient Information 2015 [place], [place]. This information is not intended to replace advice given to you by your health care provider. Make sure you discuss any questions you have with your health care provider.

## 2014-06-14 NOTE — Progress Notes (Signed)
I have read the note, and I agree with the clinical assessment and plan.  Brandon [name]   

## 2014-06-14 NOTE — Progress Notes (Signed)
PATIENT: Brandon LawrenceRaymond S [name] DOB: 1990-07-14  REASON FOR VISIT: follow up HISTORY FROM: patient  HISTORY OF PRESENT ILLNESS: Brandon [name] is a 23 year old male with a history of multiple sclerosis. He returns today for follow-up. The patient is currently taking tysabri and tolerating it well. The patient completed physical therapy with good benefit. He is currently ambulating independently. The patient did have a fall on Monday but states it was because he was trying to run. Denies any new numbness or weakness. No changes in the bowels or bladder. His mom however states that he tends to where the accident. She is unsure if this is due to his behavior problems (schizophrenia). She has been trying to encourage the patient to go to the bathroom as soon as he has the urge. She stated the patient awake during the night but she is unsure if due to him being "lazy" and not getting up. He will return to the adult depends at night occasionally. Denies any changes with his vision. His last operative treatment was November 24.  HISTORY 02/01/14 Anne Hahn(WILLIS): Brandon [name] is a 23 year old right-handed black male who comes to this office today for a mobility assessment for a power wheelchair. This patient had been treated with Tecfidera, and he was tolerating medication well. The patient was last seen on 12/12/2013, and he was doing well at that time. He was admitted to the hospital on the 15th of July 2015 with severe weakness involving a left hemiparesis. The family began noting that he was leaning to the left on or around 01/03/2014, but the deficit significantly worsened around the time of admission. He has undergone MRI evaluation of the brain and cervical spinal cord which showed changes consistent with multiple sclerosis. The patient has Medicaid, and an extended care facility was not covered. The patient returned home, but he only has 3 visits of physical and occupational therapy allow. He has a [employer] lift, and a  hospital bed with a trapeze bar. The patient has a standard wheelchair that is too wide to get out of his room. The patient is unable to use the wheelchair secondary to severe weakness of the left side, almost plegic on the arm and leg. The patient reports no numbness, visual changes, or cognitive changes following the recent MS attack. The patient is completely nonambulatory, and again he requires a lift to get him out of bed into a chair. The patient remains on Tecfidera. Today, he is using his mother's motorized wheelchair, and he has the cognitive ability and physical ability to use the chair safely. His mother had to rent a wheelchair today so that she too can come into the office.  REVIEW OF SYSTEMS: Out of a complete 14 system review of symptoms, the patient complains only of the following symptoms, and all other reviewed systems are negative.  Fatigue Eye redness Excessive eating Constipation Insomnia, frequent waking, snoring, sleep talking Joint pain, walking difficulty Agitation, behavior problems, depression, nervous/anxious, hallucinations, hyperactive  ALLERGIES: No Known Allergies  HOME MEDICATIONS: Outpatient Prescriptions Prior to Visit  Medication Sig Dispense Refill  . amLODipine (NORVASC) 5 MG tablet Take 5 mg by mouth 2 (two) times daily.    . benztropine (COGENTIN) 1 MG tablet Take 1 mg by mouth at bedtime.     Marland Kitchen. buPROPion (WELLBUTRIN XL) 150 MG 24 hr tablet Take 300 mg by mouth daily.     . cloNIDine HCl (KAPVAY) 0.1 MG TB12 ER tablet Take 0.1 mg by mouth at bedtime.    .Marland Kitchen  Dextromethorphan-Quinidine (NUEDEXTA) 20-10 MG CAPS Take 1 capsule by mouth 2 (two) times daily. 8am and 8pm    . Dimethyl Fumarate 240 MG CPDR Take 240 mg by mouth 2 (two) times daily. 8am and 8pm    . lithium carbonate (ESKALITH) 450 MG CR tablet Take 450 mg by mouth 2 (two) times daily.    Marland Kitchen perphenazine (TRILAFON) 4 MG tablet Take 4 mg by mouth 2 (two) times daily.    . traZODone (DESYREL) 100 MG  tablet Take 100 mg by mouth at bedtime.     No facility-administered medications prior to visit.    PAST MEDICAL HISTORY: Past Medical History  Diagnosis Date  . ADHD (attention deficit hyperactivity disorder)   . Hypertension   . Schizophrenia   . Bipolar 1 disorder   . 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 Obesity   . Multiple sclerosis 05/20/2013    PAST SURGICAL HISTORY: Past Surgical History  Procedure Laterality Date  . None      FAMILY HISTORY: Family History  Problem Relation Age of Onset  . Diabetes Mother   . ADD / ADHD Brother     SOCIAL HISTORY: History   Social History  . Marital Status: Single    Spouse Name: N/A    Number of Children: 0  . Years of Education: 11th   Occupational History  .      Disbaled   Social History Main Topics  . Smoking status: Current Every Day Smoker -- 0.25 packs/day    Types: Cigarettes  . Smokeless tobacco: Never Used  . Alcohol Use: No     Comment: Pt denies  . Drug Use: Yes    Special: Marijuana     Comment: Pt denies  . Sexual Activity: Not on file   Other Topics Concern  . Not on file   Social History Narrative   Patient lives at home with his mother.   Disabled.   Education 11 th grade .   Right handed.   Caffeine - one cup daily soda.      PHYSICAL EXAM  Filed Vitals:   06/14/14 1000  BP: 135/76  Pulse: 83  Temp: 98 F (36.7 C)  TempSrc: Oral  Height: 6\' 1"  (1.854 m)  Weight: 337 lb (152.862 kg)   Body mass index is 44.47 kg/(m^2).  Generalized: Well developed, in no acute distress   Neurological examination  Mentation: Alert oriented to time, place, history taking. Follows all commands speech and language fluent Cranial nerve II-XII: Pupils were equal round reactive to light. Extraocular movements were full, visual field were full on confrontational test. Facial sensation and strength were normal. Uvula tongue midline. Head turning and shoulder shrug  were normal and  symmetric. Motor: The motor testing reveals 5 over 5 strength of all 4 extremities. Good symmetric motor tone is noted throughout.  Sensory: Sensory testing is intact to soft touch on all 4 extremities. No evidence of extinction is noted.  Coordination: Cerebellar testing reveals good finger-nose-finger and heel-to-shin bilaterally.  Gait and station: Gait is normal.  Romberg is negative. No drift is seen.  Reflexes: Deep tendon reflexes are symmetric and normal bilaterally.    DIAGNOSTIC DATA (LABS, IMAGING, TESTING) - I reviewed patient records, labs, notes, testing and imaging myself where available.  Lab Results  Component Value Date   WBC 17.0* 01/22/2014   HGB 13.7 01/22/2014   HCT 41.8 01/22/2014   MCV 79.9 01/22/2014   PLT 223 01/22/2014  Component Value Date/Time   NA 137 01/19/2014 0452   K 4.3 01/19/2014 0452   CL 100 01/19/2014 0452   CO2 18* 01/19/2014 0452   GLUCOSE 237* 01/19/2014 0452   BUN 14 01/19/2014 0452   CREATININE 0.78 01/19/2014 0452   CALCIUM 9.7 01/19/2014 0452   PROT 7.5 01/18/2014 1025   ALBUMIN 4.0 01/18/2014 1025   AST 18 01/18/2014 1025   ALT 36 01/18/2014 1025   ALKPHOS 96 01/18/2014 1025   BILITOT 0.4 01/18/2014 1025   GFRNONAA >90 01/19/2014 0452   GFRAA >90 01/19/2014 0452   Lab Results  Component Value Date   CHOL 113 01/29/2011   HDL 33* 01/29/2011   LDLCALC 70 01/29/2011   TRIG 52 01/29/2011   CHOLHDL 3.4 01/29/2011   Lab Results  Component Value Date   HGBA1C 6.1* 01/20/2014   Lab Results  Component Value Date   VITAMINB12 696 07/03/2011   Lab Results  Component Value Date   TSH 0.848 01/18/2014      ASSESSMENT AND PLAN 23 y.o. year old male  has a past medical history of ADHD (attention deficit hyperactivity disorder); Hypertension; Schizophrenia; Bipolar 1 disorder; White matter abnormality on MRI of brain (11/23/2012); Obesity; and Multiple sclerosis (05/20/2013). here with:  1. Multiple sclerosis  Overall  patient is doing very well. He will continue taking the Tysabri infusions. I will check blood work today. I have encouraged the patient to try to go to the bathroom every 2-3 hours to urinate even if he doesn't have the urge. I also encouraged the patient to use the adult depends at night if he is not going to get up when he has to go the bathroom. In the future if urinary frequency continues to be an issue, we can try medication. I have advised the patient that if his symptoms worsen or he develops new symptoms he should let us know. Otherwise he will follow up in 3 months.  Butch Penny, MSN, NP-C 06/14/2014, 10:20 AM [facility] [address] [place], [place] [address] [phone]  Note: This document was prepared with digital dictation and possible smart phrase technology. Any transcriptional errors that result from this process are unintentional.

## 2014-06-15 LAB — CBC WITH DIFFERENTIAL
Basophils Absolute: 0.1 10*3/uL (ref 0.0–0.2)
Basos: 1 %
Eos: 3 %
Eosinophils Absolute: 0.3 10*3/uL (ref 0.0–0.4)
HCT: 40.9 % (ref 37.5–51.0)
Hemoglobin: 12.7 g/dL (ref 12.6–17.7)
Immature Grans (Abs): 0 10*3/uL (ref 0.0–0.1)
Immature Granulocytes: 0 %
Lymphocytes Absolute: 3.3 10*3/uL — ABNORMAL HIGH (ref 0.7–3.1)
Lymphs: 31 %
MCH: 25.3 pg — ABNORMAL LOW (ref 26.6–33.0)
MCHC: 31.1 g/dL — ABNORMAL LOW (ref 31.5–35.7)
MCV: 82 fL (ref 79–97)
Monocytes Absolute: 0.8 10*3/uL (ref 0.1–0.9)
Monocytes: 8 %
Neutrophils Absolute: 6 10*3/uL (ref 1.4–7.0)
Neutrophils Relative %: 57 %
Platelets: 221 10*3/uL (ref 150–379)
RBC: 5.01 x10E6/uL (ref 4.14–5.80)
RDW: 16 % — ABNORMAL HIGH (ref 12.3–15.4)
WBC: 10.7 10*3/uL (ref 3.4–10.8)

## 2014-06-15 LAB — COMPREHENSIVE METABOLIC PANEL
ALT: 22 IU/L (ref 0–44)
AST: 13 IU/L (ref 0–40)
Albumin/Globulin Ratio: 2.1 (ref 1.1–2.5)
Albumin: 4.4 g/dL (ref 3.5–5.5)
Alkaline Phosphatase: 100 IU/L (ref 39–117)
BUN/Creatinine Ratio: 8 (ref 8–19)
BUN: 7 mg/dL (ref 6–20)
CO2: 25 mmol/L (ref 18–29)
Calcium: 9.3 mg/dL (ref 8.7–10.2)
Chloride: 103 mmol/L (ref 97–108)
Creatinine, Ser: 0.91 mg/dL (ref 0.76–1.27)
GFR calc Af Amer: 138 mL/min/{1.73_m2} (ref >59)
GFR calc non Af Amer: 119 mL/min/{1.73_m2} (ref >59)
Globulin, Total: 2.1 g/dL (ref 1.5–4.5)
Glucose: 75 mg/dL (ref 65–99)
Potassium: 4.1 mmol/L (ref 3.5–5.2)
Sodium: 142 mmol/L (ref 134–144)
Total Bilirubin: 0.4 mg/dL (ref 0.0–1.2)
Total Protein: 6.5 g/dL (ref 6.0–8.5)

## 2014-06-20 ENCOUNTER — Other Ambulatory Visit: Payer: Self-pay | Admitting: Neurology

## 2014-06-20 DIAGNOSIS — G35 Multiple sclerosis: Secondary | ICD-10-CM

## 2014-06-21 ENCOUNTER — Encounter (HOSPITAL_COMMUNITY): Payer: Medicare Other

## 2014-06-22 NOTE — Progress Notes (Signed)
Called and LM of [place] labs and negative JC antibody.

## 2014-06-28 ENCOUNTER — Encounter (HOSPITAL_COMMUNITY): Payer: Medicare Other

## 2014-06-28 ENCOUNTER — Other Ambulatory Visit (HOSPITAL_COMMUNITY): Payer: Self-pay | Admitting: Neurology

## 2014-06-28 ENCOUNTER — Encounter (HOSPITAL_COMMUNITY): Admission: RE | Admit: 2014-06-28 | Payer: Medicare Other | Source: Ambulatory Visit

## 2014-07-19 ENCOUNTER — Encounter (HOSPITAL_COMMUNITY): Payer: Medicare Other

## 2014-07-19 ENCOUNTER — Emergency Department (HOSPITAL_COMMUNITY)
Admission: EM | Admit: 2014-07-19 | Discharge: 2014-07-20 | Disposition: A | Discharge status: Home / Self Care | Payer: Medicare Other | Attending: Emergency Medicine | Admitting: Emergency Medicine

## 2014-07-19 ENCOUNTER — Encounter (HOSPITAL_COMMUNITY): Payer: Self-pay | Admitting: Emergency Medicine

## 2014-07-19 DIAGNOSIS — F419 Anxiety disorder, unspecified: Secondary | ICD-10-CM | POA: Insufficient documentation

## 2014-07-19 DIAGNOSIS — F209 Schizophrenia, unspecified: Secondary | ICD-10-CM | POA: Diagnosis not present

## 2014-07-19 DIAGNOSIS — R45850 Homicidal ideations: Secondary | ICD-10-CM

## 2014-07-19 DIAGNOSIS — E669 Obesity, unspecified: Secondary | ICD-10-CM | POA: Insufficient documentation

## 2014-07-19 DIAGNOSIS — F319 Bipolar disorder, unspecified: Secondary | ICD-10-CM | POA: Insufficient documentation

## 2014-07-19 DIAGNOSIS — I10 Essential (primary) hypertension: Secondary | ICD-10-CM | POA: Diagnosis not present

## 2014-07-19 DIAGNOSIS — Z8669 Personal history of other diseases of the nervous system and sense organs: Secondary | ICD-10-CM | POA: Insufficient documentation

## 2014-07-19 DIAGNOSIS — Z720 Tobacco use: Secondary | ICD-10-CM | POA: Insufficient documentation

## 2014-07-19 DIAGNOSIS — R440 Auditory hallucinations: Secondary | ICD-10-CM

## 2014-07-19 DIAGNOSIS — Z79899 Other long term (current) drug therapy: Secondary | ICD-10-CM | POA: Insufficient documentation

## 2014-07-19 LAB — COMPREHENSIVE METABOLIC PANEL
ALT: 22 U/L (ref 0–53)
AST: 16 U/L (ref 0–37)
Albumin: 4.2 g/dL (ref 3.5–5.2)
Alkaline Phosphatase: 90 U/L (ref 39–117)
Anion gap: 6 (ref 5–15)
BUN: 15 mg/dL (ref 6–23)
CO2: 27 mmol/L (ref 19–32)
Calcium: 9.1 mg/dL (ref 8.4–10.5)
Chloride: 104 mEq/L (ref 96–112)
Creatinine, Ser: 0.9 mg/dL (ref 0.50–1.35)
GFR calc Af Amer: >90 mL/min (ref >90)
GFR calc non Af Amer: >90 mL/min (ref >90)
Glucose, Bld: 84 mg/dL (ref 70–99)
Potassium: 3.6 mmol/L (ref 3.5–5.1)
Sodium: 137 mmol/L (ref 135–145)
Total Bilirubin: 0.4 mg/dL (ref 0.3–1.2)
Total Protein: 7.1 g/dL (ref 6.0–8.3)

## 2014-07-19 LAB — CBC
HCT: 40.3 % (ref 39.0–52.0)
Hemoglobin: 12.7 g/dL — ABNORMAL LOW (ref 13.0–17.0)
MCH: 25.7 pg — ABNORMAL LOW (ref 26.0–34.0)
MCHC: 31.5 g/dL (ref 30.0–36.0)
MCV: 81.4 fL (ref 78.0–100.0)
Platelets: 257 10*3/uL (ref 150–400)
RBC: 4.95 MIL/uL (ref 4.22–5.81)
RDW: 16.1 % — ABNORMAL HIGH (ref 11.5–15.5)
WBC: 12.4 10*3/uL — ABNORMAL HIGH (ref 4.0–10.5)

## 2014-07-19 MED ORDER — ZOLPIDEM TARTRATE 5 MG PO TABS: 5.0000 mg | ORAL_TABLET | Freq: Every evening | ORAL | Status: DC | PRN

## 2014-07-19 MED ORDER — CLONIDINE HCL 0.1 MG PO TABS: 0.1000 mg | ORAL_TABLET | Freq: Two times a day (BID) | ORAL | Status: DC

## 2014-07-19 MED ORDER — ACETAMINOPHEN 325 MG PO TABS: 650.0000 mg | ORAL_TABLET | ORAL | Status: DC | PRN

## 2014-07-19 MED ORDER — ONDANSETRON HCL 4 MG PO TABS: 4.0000 mg | ORAL_TABLET | Freq: Three times a day (TID) | ORAL | Status: DC | PRN

## 2014-07-19 MED ORDER — LORAZEPAM 1 MG PO TABS: 1.0000 mg | ORAL_TABLET | Freq: Three times a day (TID) | ORAL | Status: DC | PRN

## 2014-07-19 MED ORDER — TRAZODONE HCL 100 MG PO TABS
100.0000 mg | ORAL_TABLET | Freq: Every day | ORAL | Status: DC
  Administered 2014-07-20: 100 mg via ORAL
  Filled 2014-07-19: qty 1

## 2014-07-19 MED ORDER — LITHIUM CARBONATE ER 450 MG PO TBCR
450.0000 mg | EXTENDED_RELEASE_TABLET | Freq: Two times a day (BID) | ORAL | Status: DC
  Administered 2014-07-20 (×2): 450 mg via ORAL
  Filled 2014-07-19 (×3): qty 1

## 2014-07-19 MED ORDER — CLONIDINE HCL 0.1 MG PO TABS
0.1000 mg | ORAL_TABLET | Freq: Every day | ORAL | Status: DC
  Administered 2014-07-20: 0.1 mg via ORAL
  Filled 2014-07-19: qty 1

## 2014-07-19 MED ORDER — ALUM & MAG HYDROXIDE-SIMETH 200-200-20 MG/5ML PO SUSP: 30.0000 mL | ORAL | Status: DC | PRN

## 2014-07-19 MED ORDER — BUPROPION HCL ER (XL) 300 MG PO TB24
300.0000 mg | ORAL_TABLET | Freq: Every day | ORAL | Status: DC
  Administered 2014-07-20: 300 mg via ORAL
  Filled 2014-07-19: qty 1

## 2014-07-19 MED ORDER — IBUPROFEN 200 MG PO TABS: 600.0000 mg | ORAL_TABLET | Freq: Three times a day (TID) | ORAL | Status: DC | PRN
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
## 2014-07-19 NOTE — ED Notes (Signed)
Pt belonging sheet filled out 

## 2014-07-19 NOTE — ED Notes (Signed)
I tried to collect labs and was unsuccessful.
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
## 2014-07-20 DIAGNOSIS — F209 Schizophrenia, unspecified: Secondary | ICD-10-CM | POA: Diagnosis not present

## 2014-07-20 DIAGNOSIS — R440 Auditory hallucinations: Secondary | ICD-10-CM | POA: Insufficient documentation

## 2014-07-20 DIAGNOSIS — R45850 Homicidal ideations: Secondary | ICD-10-CM | POA: Insufficient documentation

## 2014-07-20 DIAGNOSIS — F259 Schizoaffective disorder, unspecified: Secondary | ICD-10-CM | POA: Insufficient documentation

## 2014-07-20 LAB — SALICYLATE LEVEL: Salicylate Lvl: <4 mg/dL (ref 2.8–20.0)

## 2014-07-20 LAB — ACETAMINOPHEN LEVEL: Acetaminophen (Tylenol), Serum: <10 ug/mL — ABNORMAL LOW (ref 10–30)

## 2014-07-20 LAB — ETHANOL: Alcohol, Ethyl (B): <5 mg/dL (ref 0–9)

## 2014-07-20 NOTE — [facility] Suicide Risk Assessment (Cosign Needed)
Suicide Risk Assessment  Discharge Assessment     Demographic Factors:  Male and Low socioeconomic status  Total Time spent with patient: 30 minutes  Psychiatric Specialty Exam:     Blood pressure 108/63, pulse 72, temperature 97.8 F (36.6 C), temperature source Oral, resp. rate 20, SpO2 97 %.There is no weight on file to calculate BMI.  General Appearance: Casual  Eye Contact::  Good  Speech:  Clear and Coherent and Normal Rate  Volume:  Normal  Mood:  Euthymic  Affect:  Congruent  Thought Process:  Coherent, Goal Directed and Intact  Orientation:  Full (Time, Place, and Person)  Thought Content:  NA  Suicidal Thoughts:  No  Homicidal Thoughts:  No  Memory:  Immediate;   Good Recent;   Good Remote;   Good  Judgement:  Good  Insight:  Good  Psychomotor Activity:  Normal  Concentration:  Good  Recall:  NA  Fund of Knowledge:Good  Language: Good  Akathisia:  NA  Handed:  Right  AIMS (if indicated):     Assets:  Desire for Improvement  Sleep:       Musculoskeletal: Strength & Muscle Tone: within normal limits Gait & Station: normal Patient leans: N/A   Mental Status Per Nursing Assessment::   On Admission:     Current Mental Status by Physician: NA  Loss Factors: NA  Historical Factors: NA  Risk Reduction Factors:   Religious beliefs about death, Living with another person, especially a relative and Positive social support  Continued Clinical Symptoms:  Depression:   Anhedonia Schizophrenia:   Depressive state  Cognitive Features That Contribute To Risk:  Polarized thinking    Suicide Risk:  Minimal: No identifiable suicidal ideation.  Patients presenting with no risk factors but with morbid ruminations; may be classified as minimal risk based on the severity of the depressive symptoms  Discharge Diagnoses: Schizoaffective disorder Past Medical History  Diagnosis Date  . ADHD (attention deficit hyperactivity disorder)   . Hypertension   .  Schizophrenia   . Bipolar 1 disorder   . 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 Obesity   . Multiple sclerosis 05/20/2013     Plan Of Care/Follow-up recommendations:  Activity:  AS tolerated Diet:  Regular  Is patient on multiple antipsychotic therapies at discharge:  No   Has Patient had three or more failed trials of antipsychotic monotherapy by history:  No  Recommended Plan for Multiple Antipsychotic Therapies: NA    Dahlia Byes, C   PMHNP-BC 07/20/2014, 4:42 PM

## 2014-07-20 NOTE — ED Notes (Signed)
Up to the bathroom 

## 2014-07-20 NOTE — ED Notes (Signed)
Patient arrives to unit via ambulatory with a steady gait. Patient states " I hear voices telling me to knock people out and to hurt other people".  Patient calm and cooperative at this time. Patient admits to HI, AH and denies SI and VH. Patient given a meal and soda. Encouragement and support provided and safety maintain.

## 2014-07-20 NOTE — ED Notes (Signed)
Josephine NP into see 

## 2014-07-20 NOTE — ED Notes (Signed)
TTS into see 

## 2014-07-20 NOTE — ED Notes (Signed)
Pt ambulatory to dc window, belongings returned after leaving the unit.  Dc instructions reviewed w/ pt's aunt w/ pt's permission.

## 2014-07-20 NOTE — ED Notes (Signed)
In the bathroom
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
## 2014-07-20 NOTE — ED Notes (Addendum)
Written dc instructions reviewed w/ patient.  Pt encouraged to take his medications as directed and follow up w/ his psychiatrist.  Pt encouraged to return/seek treatment for return of suicidal or homicidal thoughts or concerns.  Pt verbalized understanding.

## 2014-07-20 NOTE — BHH Counselor (Signed)
Per Donell Sievert, PA, pt meets inpt criteria. No beds available at [facility] TTS will seek placement.    Cyndie Mull, [facility] Triage Specialist
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
## 2014-07-20 NOTE — ED Notes (Signed)
Dr Mervyn Skeeters and Julieanne Cotton np into see

## 2014-07-20 NOTE — BH Assessment (Signed)
Dr. Jannifer Franklin and Julieanne Cotton, NP recommend discharge home after receiving collateral information from parents. Writer contacted patient's mother Dillen, [place] [phone] and left a voicemail.

## 2014-07-20 NOTE — ED Notes (Signed)
Up on the phone 
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
## 2014-07-26 ENCOUNTER — Encounter (HOSPITAL_COMMUNITY): Payer: Medicare Other

## 2014-07-27 ENCOUNTER — Encounter (HOSPITAL_COMMUNITY): Payer: Self-pay

## 2014-07-27 ENCOUNTER — Encounter (HOSPITAL_COMMUNITY)
Admission: RE | Admit: 2014-07-27 | Discharge: 2014-07-27 | Disposition: A | Discharge status: Home / Self Care | Payer: Medicare Other | Source: Ambulatory Visit | Attending: Neurology | Admitting: Neurology

## 2014-07-27 VITALS — BP 149/98 | HR 81 | Temp 97.9°F | Resp 18

## 2014-07-27 DIAGNOSIS — G35 Multiple sclerosis: Secondary | ICD-10-CM

## 2014-07-27 MED ORDER — ACETAMINOPHEN 325 MG PO TABS
650.0000 mg | ORAL_TABLET | ORAL | Status: DC
  Administered 2014-07-27: 650 mg via ORAL
  Filled 2014-07-27: qty 2

## 2014-07-27 MED ORDER — SODIUM CHLORIDE 0.9 % IV SOLN
300.0000 mg | INTRAVENOUS | Status: DC
  Administered 2014-07-27: 300 mg via INTRAVENOUS
  Filled 2014-07-27: qty 15

## 2014-07-27 MED ORDER — SODIUM CHLORIDE 0.9 % IV SOLN
INTRAVENOUS | Status: DC
  Administered 2014-07-27: 09:00:00 via INTRAVENOUS

## 2014-07-27 MED ORDER — LORATADINE 10 MG PO TABS
10.0000 mg | ORAL_TABLET | ORAL | Status: DC
  Administered 2014-07-27: 10 mg via ORAL
  Filled 2014-07-27: qty 1

## 2014-07-27 NOTE — Progress Notes (Signed)
Pt's mother came by desk and states: "I'm gong to get the car, Erioluwa is in there pulling that IV out because he is tired of waiting on you".  Informed the pt's mother it wasn't time for pt to leave and that is why I wasn't in there.  Had told pt he could leave at 1100 and I would take his IV out then.  Pt was completing the recommended 1 hour post infusion observation.  Arrived in room to find pt had already pulled the IV site out and he was bleeding from the IV insertion site.  Applied gauze and pressure to site and the bleeding stopped quickly.  Applied pressure dressing to site.  Reminded pt he should not have took the IV out like that.  Pt did not say anything.  Pt left ambulatory to lobby area.  Pt completed his 1 hour post infusion observation.

## 2014-08-04 ENCOUNTER — Emergency Department (HOSPITAL_COMMUNITY)
Admission: EM | Admit: 2014-08-04 | Discharge: 2014-08-05 | Disposition: A | Discharge status: Other Acute Inpatient Hospital | Payer: Medicare Other | Attending: Emergency Medicine | Admitting: Emergency Medicine

## 2014-08-04 DIAGNOSIS — F258 Other schizoaffective disorders: Secondary | ICD-10-CM

## 2014-08-04 DIAGNOSIS — F2089 Other schizophrenia: Secondary | ICD-10-CM | POA: Insufficient documentation

## 2014-08-04 DIAGNOSIS — I10 Essential (primary) hypertension: Secondary | ICD-10-CM | POA: Diagnosis not present

## 2014-08-04 DIAGNOSIS — R443 Hallucinations, unspecified: Secondary | ICD-10-CM | POA: Diagnosis present

## 2014-08-04 DIAGNOSIS — F259 Schizoaffective disorder, unspecified: Secondary | ICD-10-CM

## 2014-08-04 LAB — COMPREHENSIVE METABOLIC PANEL
ALT: 25 U/L (ref 0–53)
AST: 17 U/L (ref 0–37)
Albumin: 4.1 g/dL (ref 3.5–5.2)
Alkaline Phosphatase: 82 U/L (ref 39–117)
Anion gap: 6 (ref 5–15)
BUN: 9 mg/dL (ref 6–23)
CO2: 29 mmol/L (ref 19–32)
Calcium: 9.4 mg/dL (ref 8.4–10.5)
Chloride: 104 mmol/L (ref 96–112)
Creatinine, Ser: 0.97 mg/dL (ref 0.50–1.35)
GFR calc Af Amer: >90 mL/min (ref >90)
GFR calc non Af Amer: >90 mL/min (ref >90)
Glucose, Bld: 94 mg/dL (ref 70–99)
Potassium: 3.3 mmol/L — ABNORMAL LOW (ref 3.5–5.1)
Sodium: 139 mmol/L (ref 135–145)
Total Bilirubin: 0.6 mg/dL (ref 0.3–1.2)
Total Protein: 7 g/dL (ref 6.0–8.3)

## 2014-08-04 LAB — CBC
HCT: 39.4 % (ref 39.0–52.0)
Hemoglobin: 12.6 g/dL — ABNORMAL LOW (ref 13.0–17.0)
MCH: 25 pg — ABNORMAL LOW (ref 26.0–34.0)
MCHC: 32 g/dL (ref 30.0–36.0)
MCV: 78 fL (ref 78.0–100.0)
Platelets: 245 10*3/uL (ref 150–400)
RBC: 5.05 MIL/uL (ref 4.22–5.81)
RDW: 15.2 % (ref 11.5–15.5)
WBC: 11.6 10*3/uL — ABNORMAL HIGH (ref 4.0–10.5)

## 2014-08-04 LAB — ETHANOL: Alcohol, Ethyl (B): <5 mg/dL (ref 0–9)

## 2014-08-04 LAB — SALICYLATE LEVEL: Salicylate Lvl: <4 mg/dL (ref 2.8–20.0)

## 2014-08-04 LAB — ACETAMINOPHEN LEVEL: Acetaminophen (Tylenol), Serum: <10 ug/mL — ABNORMAL LOW (ref 10–30)

## 2014-08-04 MED ORDER — ACETAMINOPHEN 325 MG PO TABS
650.0000 mg | ORAL_TABLET | ORAL | Status: DC | PRN
  Administered 2014-08-05: 650 mg via ORAL
  Filled 2014-08-04: qty 2

## 2014-08-04 MED ORDER — ONDANSETRON HCL 4 MG PO TABS: 4.0000 mg | ORAL_TABLET | Freq: Three times a day (TID) | ORAL | Status: DC | PRN

## 2014-08-04 NOTE — BH Assessment (Signed)
Notified of assessment. Spoke with Brandon Norman, NP who Pt has history of schizophrenia and is off medications. He believes he is God. Family petitioned for IVC. Tele-assessment will be initiated.  Harlin Rain Ria Comment, [facility] Triage Specialist [phone]

## 2014-08-04 NOTE — Progress Notes (Signed)
CSW faxed patient referral to: [facility]  Will continue to seek placement.  Melbourne Abts, LCSWA Disposition staff 08/04/2014 11:25 PM

## 2014-08-04 NOTE — ED Provider Notes (Signed)
CSN: [id number]     Arrival date & time 08/04/14  1757 History   First MD Initiated Contact with Patient 08/04/14 1816     Chief Complaint  Patient presents with  . Paranoid     (Consider location/radiation/quality/duration/timing/severity/associated sxs/prior Treatment) HPI Comments: Patient presents with [facility] PD with involuntary commitment paperwork obtained today.  Patient has a history of schizophrenia.  Has not been taking his medication.  He thinks he is "god" and that no one can kill him.  He is frequently belligerent, strikes at the walls at home, and is threatening towards his family members.  Patient is a 24 y.o. male presenting with mental health disorder. The history is provided by the police. No language interpreter was used.  Mental Health Problem Presenting symptoms: paranoid behavior   Patient accompanied by:  Law enforcement Degree of incapacity (severity):  Moderate Chronicity:  Recurrent Context: noncompliance   Treatment compliance:  Unable to specify Risk factors: hx of mental illness     Past Medical History  Diagnosis Date  . ADHD (attention deficit hyperactivity disorder)   . Hypertension   . Schizophrenia   . Bipolar 1 disorder   . 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 Obesity   . Multiple sclerosis 05/20/2013   Past Surgical History  Procedure Laterality Date  . None     Family History  Problem Relation Age of Onset  . Diabetes Mother   . ADD / ADHD Brother    History  Substance Use Topics  . Smoking status: Current Every Day Smoker -- 0.25 packs/day    Types: Cigarettes  . Smokeless tobacco: Never Used  . Alcohol Use: Yes     Comment: sometimes    Review of Systems  Psychiatric/Behavioral: Positive for behavioral problems, dysphoric mood and paranoia.  All other systems reviewed and are negative.     Allergies  Review of patient's allergies indicates no known allergies.  Home Medications   Prior to  Admission medications   Medication Sig Start Date End Date Taking? Authorizing Provider  buPROPion (WELLBUTRIN XL) 150 MG 24 hr tablet Take 300 mg by mouth daily.     Historical Provider, MD  cloNIDine (CATAPRES) 0.1 MG tablet  06/09/14   Historical Provider, MD  lithium carbonate (ESKALITH) 450 MG CR tablet Take 450 mg by mouth 2 (two) times daily.    Historical Provider, MD  traZODone (DESYREL) 100 MG tablet Take 100 mg by mouth at bedtime.    Historical Provider, MD   BP 136/77 mmHg  Pulse 95  Temp(Src) 97.9 F (36.6 C) (Oral)  Resp 16  SpO2 100% Physical Exam  Constitutional: He is oriented to person, place, and time. He appears well-developed and well-nourished.  HENT:  Head: Normocephalic and atraumatic.  Eyes: EOM are normal. Pupils are equal, round, and reactive to light.  Neck: Neck supple.  Cardiovascular: Normal rate and regular rhythm.   Pulmonary/Chest: Effort normal and breath sounds normal.  Abdominal: Soft.  Musculoskeletal: He exhibits no edema or tenderness.  Lymphadenopathy:    He has no cervical adenopathy.  Neurological: He is alert and oriented to person, place, and time.  Skin: Skin is warm.  Psychiatric: His affect is angry. He expresses impulsivity. He is inattentive.  Nursing note and vitals reviewed.   ED Course  Procedures (including critical care time) Labs Review Labs Reviewed  ACETAMINOPHEN LEVEL  CBC  COMPREHENSIVE METABOLIC PANEL  ETHANOL  SALICYLATE LEVEL  URINE RAPID DRUG SCREEN (HOSP PERFORMED)  Imaging Review No results found.   EKG Interpretation None      MDM   Final diagnoses:  None    Schizophrenia. Paranoia.   Patient brought in to ED by [facility] PD with paperwork from magistrate for involuntary commitment.  Medically screened and cleared. Patient has been evaluated by TTS. Patient awaiting inpatient placement. Patient placed in psych holding with orders.    Jimmye Norman, NP 08/05/14 0001  Richardean Canal,  MD 08/05/14 2218

## 2014-08-04 NOTE — BH Assessment (Signed)
Assessment complete. Binnie Rail, [facility], confirms adult unit is currently at capacity. Gave clinical report to Janann August, NP who said Pt meets criteria for inpatient psychiatric treatment. TTS will contact other facilities for placement. Notified Dr. Thurnell Lose and Wille Celeste, RN of recommendation.  Harlin Rain Ria Comment, [facility] Triage Specialist [phone]

## 2014-08-04 NOTE — ED Notes (Signed)
Pt in from home via [employer] with IVC papers upon arrival to ED, per [employer] officers Benotti & King-Hyatt, the pts  Mother went to have IVC papers taken out today d/t pt not taking schizophrenic meds, reports drinking 40 oz of ETOH daily with marijuana use x 1 mth ago, per IVC papers the pt can become belligerent & hit walls & calls himself God, pt calm & cooperative, pt has poor hygeine & is accompanied by [employer] upon arrival to ED, pt aware of IVC policy at this ED
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
## 2014-08-05 DIAGNOSIS — R443 Hallucinations, unspecified: Secondary | ICD-10-CM | POA: Diagnosis present

## 2014-08-05 DIAGNOSIS — F2089 Other schizophrenia: Secondary | ICD-10-CM | POA: Diagnosis not present

## 2014-08-05 LAB — RAPID URINE DRUG SCREEN, HOSP PERFORMED
Amphetamines: NOT DETECTED
Barbiturates: NOT DETECTED
Benzodiazepines: NOT DETECTED
Cocaine: NOT DETECTED
Opiates: NOT DETECTED
Tetrahydrocannabinol: POSITIVE — AB

## 2014-08-05 MED ORDER — LISINOPRIL 10 MG PO TABS
10.0000 mg | ORAL_TABLET | Freq: Every day | ORAL | Status: DC
  Administered 2014-08-05: 10 mg via ORAL
  Filled 2014-08-05 (×3): qty 1

## 2014-08-05 MED ORDER — LITHIUM CARBONATE ER 450 MG PO TBCR
450.0000 mg | EXTENDED_RELEASE_TABLET | Freq: Two times a day (BID) | ORAL | Status: DC
  Administered 2014-08-05: 450 mg via ORAL
  Filled 2014-08-05 (×2): qty 1

## 2014-08-05 MED ORDER — BUPROPION HCL ER (XL) 300 MG PO TB24
300.0000 mg | ORAL_TABLET | Freq: Every day | ORAL | Status: DC
  Administered 2014-08-05: 300 mg via ORAL
  Filled 2014-08-05: qty 1

## 2014-08-05 MED ORDER — TRAZODONE HCL 100 MG PO TABS
100.0000 mg | ORAL_TABLET | Freq: Every day | ORAL | Status: DC
  Administered 2014-08-05: 100 mg via ORAL
  Filled 2014-08-05: qty 1

## 2014-08-05 MED ORDER — POTASSIUM CHLORIDE CRYS ER 20 MEQ PO TBCR
40.0000 meq | EXTENDED_RELEASE_TABLET | Freq: Once | ORAL | Status: AC
  Administered 2014-08-05: 40 meq via ORAL
  Filled 2014-08-05: qty 2

## 2014-08-05 NOTE — BH Assessment (Signed)
BHH Assessment Progress Note  Received call from [facility] from [place] stating pt declined there due to a hx of aggression at 1327.  Casimer Lanius, [facility]

## 2014-08-05 NOTE — ED Notes (Signed)
PATIENT STATES HE [facility]ES TO [facility]AL [facility]ALTH CARE. STATES THAT THE ZYPREXA WORKED Mining engineer FOR HIM THAN THE ABILIFY DID. STATES THE DOCTOR THERE STOPPED HIS INJECTIONS AND PUT HIM ON PILLS. STATES HE DID TAKE THE PILLS

## 2014-08-05 NOTE — ED Notes (Signed)
Pt up to the desk to notify his mother of transfer

## 2014-08-05 NOTE — ED Notes (Signed)
Pt ambulatory to [facility] w/ sheriff.  Belongings/EMTELA/IVC papers/MAR report/face sheet/transfer report/origional guardianship papers sent w/ sheriff.

## 2014-08-05 NOTE — ED Notes (Signed)
Pt is aware that he will be transfering to [facility] today

## 2014-08-05 NOTE — ED Provider Notes (Addendum)
Patient awake, alert, calm, conversing w sitter.   Filed Vitals:   08/05/14 0609  BP: 134/80  Pulse: 58  Temp: 97.5 F (36.4 C)  Resp: 20   Will restart pts home meds.   Psych team working on placement - disposition remains pending.     Suzi Roots, MD 08/05/14 [phone]  Psych team requests transfer to [facility] for their assessment and placement. Nursing has discussed w charge rn at [facility] and [facility] staff who indicate they can accept pt.  I spoke w Dr Ethelda Chick who accepts pt to go to [facility].  Pt remains alert, conversant, content, vitals normal, and appears stable for transfer.     Suzi Roots, MD 08/05/14 [phone]

## 2014-08-05 NOTE — BH Assessment (Addendum)
BHH Assessment Progress Note   Update:  The following facilities were contacted in an attempt to place the pt by this clinician:  Referral Faxed for [facility] [place]-beds per [facility] per Anne [facility] [place]-No answer at [phone], but referral faxed for possible wait list [facility]-fax referral per [facility] referrals per [facility] [place]-beds per [facility]-possible bed per Roger Kill stated to fax referral [place]-No answer at [phone], but referral faxed for [facility] [place]-beds per [facility]-beds per [facility] [place]-beds per Davonna Belling [place]-beds per Arline [facility] [place]-beds per [facility]-beds per Selena Batten  At [facility], per [place] at [facility] per Zella Ball at [address] Christell Constant per Diane at [phone] [facility] per [place] at [employer] [facility] per [place] at [employer] Leonette Monarch per [place] at [phone] Turner Daniels at 1120  No Answer/Left Message [facility]   TTS will continue to seek placement for the pt.  Casimer Lanius, MS, [facility] Licensed Professional Counselor Therapeutic Triage Specialist [facility] Phone: [phone] Fax: [phone]

## 2014-08-05 NOTE — Progress Notes (Signed)
LCSW spoke with patient's mother Ellery Plunk at [phone] to inform her that the patient would be transferring to [facility]. LCSW verified the patient's number and provided her with the telephone number and address to [facility]. LCSW informed the patient that his mother would like for him to contact her before leaving.

## 2014-08-05 NOTE — Discharge Instructions (Signed)
Transfer to [facility].
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
## 2014-08-05 NOTE — ED Notes (Signed)
[facility] here to transport pt

## 2014-08-05 NOTE — Progress Notes (Signed)
CSW talked to the patient's mother, who has a petition for adjudication of incompetence signed on 08/02/14 by the clerk of courts in [place].  [name] did not understand the IVC rules, these were explained. CSW discussed that the patient was deemed in need of psych inpatient treatment and would not leave the ED until an appropriate facility had been found and then the patient would be transported there.  [name] stated that patient was doing well on his 70 day anti-psychotic injection till July 2015 when he relapsed on MS.  After this they took him off the injection due to him having to come to the clinic and wait three hours before leaving and this was not possible his mother explained.  Patient was then changed to oral medications, that were not effective for patient and the mother was dubious about the patient actually taking them as prescribed.  [name] would like him to restarted on his injectable and will try to call with the name of the medication.  [name] also stated that his ACTT team was [facility], but "they lost their license or something."  Patient has been transferred to [facility] and he has his intake scheduled for 2 Feb 16.  [name] would prefer that patient be hospitalized in [place] or [place].  CSW let the patient's mother know we would attempt this, but the patient would have to go to the first available hospital placement and staff would keep her informed about his care.  [facility] CSW [phone]

## 2014-08-05 NOTE — Progress Notes (Signed)
CSW following up with TTS to assist with disposition.  Referrals faxed to the following that have available beds:  Sharyne Richters per [facility] per [facility] per [place]  Facilities that were at capacity:  Quenton Fetter per [facility] per [facility] per [facility] per [employer]

## 2014-08-05 NOTE — ED Notes (Signed)
Sheriff is here to transport pt, belongings and meds sent w/ pt.

## 2014-08-05 NOTE — ED Notes (Signed)
[employer] CALLED TO TRANSPORT 

## 2014-08-05 NOTE — ED Notes (Signed)
Message left for sheriff to transport 

## 2014-08-05 NOTE — ED Notes (Signed)
Pt ambulatory to room 38 from [facility] ED , transported by [facility] under IVC.  Aram Beecham at [facility] reports that the patient has been accepted for admission to Dr Ellin Mayhew and can transport.  Room to be assigned on arrival.

## 2014-08-16 ENCOUNTER — Encounter (HOSPITAL_COMMUNITY): Payer: Medicare Other

## 2014-08-24 ENCOUNTER — Encounter (HOSPITAL_COMMUNITY): Admission: RE | Admit: 2014-08-24 | Payer: Medicare Other | Source: Ambulatory Visit

## 2014-09-08 ENCOUNTER — Telehealth: Payer: Self-pay | Admitting: *Deleted

## 2014-09-08 ENCOUNTER — Telehealth: Payer: Self-pay | Admitting: Neurology

## 2014-09-08 NOTE — Telephone Encounter (Signed)
Spoke to mom .   Brandon [name] feels a little clumsier in left arm.   Missed 2/18 infusion    -- genally ok to be a couple weeks late now and then. We can try to call and get him in sooner but may not be able to get in before 3/17 for infusion.  He has appt with Aundra Millet on Wednesday.   Advised to call back Monday if he is doing worse

## 2014-09-08 NOTE — Telephone Encounter (Signed)
Aundra Millet will speak with Dr. Epimenio Foot about this/fim

## 2014-09-08 NOTE — Telephone Encounter (Signed)
Spoke to Winnett I will Send this to work in Dr. Epimenio Foot and I will let RN Faith know message was sent to see what we can do for patient.

## 2014-09-08 NOTE — Telephone Encounter (Signed)
Megan Please advise me on this please.

## 2014-09-08 NOTE — Telephone Encounter (Signed)
Called Dee at [facility] at [phone] and spoke to Ms. Francena Hanly. Dee Was at lunch. Patient has already been rescheduled for 09-21-14 arrive at nine am . Patient and his mother are aware. Per Ms. Francena Hanly schedule is full and to call back on Monday to see if any CX. Per Ms. Francena Hanly states [name] has called there x2 to get him a sooner apt. Already.

## 2014-09-08 NOTE — Telephone Encounter (Signed)
I called the patient and spoke to his mother. She states that he missed is Tysabri dose on feb 18th.  She states that she has been trying to reschedule this infusion however there has been no openings. She states that the patient is now drawing up the the left hand and dragging the left leg. She feels that he is having a relapse. I have advised the mother that if she feels that he is having a relapse she will have to take him to the emergency room since our office is now closed. She verbalized understanding. I will have my assistant get in touch with [facility] with the infusion center to see why the to tysabri has not been rescheduled.

## 2014-09-11 ENCOUNTER — Telehealth: Payer: Self-pay | Admitting: Neurology

## 2014-09-11 NOTE — Telephone Encounter (Signed)
Brandon [name] from [employer] is calling stating the form for the touch program needs to be signed and completed the one on file expires on 09/22/14.  Please fax to [phone] if you have questions please call Brandon [name] @ [phone] ext *[phone] for dept and ext. [phone].

## 2014-09-11 NOTE — Telephone Encounter (Signed)
Spoke to Morea at [employer] and relayed that we didn't get the reauthorization form.  She will fax it over today and it is due back on 09-21-14.

## 2014-09-11 NOTE — Telephone Encounter (Signed)
I will see the patient in the office this week. 

## 2014-09-13 ENCOUNTER — Encounter: Payer: Self-pay | Admitting: Adult Health

## 2014-09-13 ENCOUNTER — Ambulatory Visit (INDEPENDENT_AMBULATORY_CARE_PROVIDER_SITE_OTHER): Payer: Medicare Other | Admitting: Adult Health

## 2014-09-13 VITALS — BP 140/77 | HR 62 | Ht 73.0 in | Wt 324.0 lb

## 2014-09-13 DIAGNOSIS — G35 Multiple sclerosis: Secondary | ICD-10-CM

## 2014-09-13 DIAGNOSIS — Z5181 Encounter for therapeutic drug level monitoring: Secondary | ICD-10-CM | POA: Diagnosis not present

## 2014-09-13 DIAGNOSIS — F259 Schizoaffective disorder, unspecified: Secondary | ICD-10-CM | POA: Diagnosis not present

## 2014-09-13 NOTE — Progress Notes (Signed)
PATIENT: Brandon [name] DOB: 29-Mar-1991  REASON FOR VISIT: follow up- multiple Sclerosis  HISTORY FROM: patient  HISTORY OF PRESENT ILLNESS:Brandon [name] is a 24 year old male with a history of multiple sclerosis. He returns today for follow-up. The patient is currently taking tysabri and tolerating it well. The patient missed his February dose due to a court date. His mom felt that he was having a relapse this past Friday. States that he was drawing up the left arm and dragging the left leg. On Friday it was recommended that she take him to the emergency room for IV steroids if she felt that he was having a relapse. Mom states today that she did not take him to the emergency room she decide to just watch him over the weekend. She has not noticed any worsening of his symptoms. The patient reports that he has had no changes in his bowels or bladder. He does tend have urgency at bedtime. The mom contributes this to him drinking fluids all throughout the night. Patient states that this does not happen much during the day. He denies any new numbness or weakness. No changes in his vision. He does have a appointment with his ophthalmologist next month. According to the mom the patient has had some falls. She is unsure what they were related to. The patient does smoke marijuana and drinks beer. His next Tysabri treatment is March 17. The patient is still getting in trouble with the police. Apparently he keeps trespassing on the same property. He has a another court date in March. advised the mom that it is imperative that he not missed his next to Tysabri treatment.  Brandon [name] is a 24 year old male withhistory of multiple sclerosis. He returns today for follow-up. The patient is currently taking tysabri and tolerating it well. The patient completed physical therapy with good benefit. He is currently ambulating independently. The patient did have a fall on Monday but states it was because he was trying to run.  Denies any new numbness or weakness. No changes in the bowels or bladder. His mom however states that he tends to where the accident. She is unsure if this is due to his behavior problems (schizophrenia). She has been trying to encourage the patient to go to the bathroom as soon as he has the urge. She stated the patient awake during the night but she is unsure if due to him being "lazy" and not getting up. He will return to the adult depends at night occasionally. Denies any changes with his vision. His last operative treatment was November 24.  HISTORY 02/01/14 Anne Hahn): Brandon [name] is a 24 year old right-handed black male who comes to this office today for a mobility assessment for a power wheelchair. This patient had been treated with Tecfidera, and he was tolerating medication well. The patient was last seen on 12/12/2013, and he was doing well at that time. He was admitted to the hospital on the 15th of July 2015 with severe weakness involving a left hemiparesis. The family began noting that he was leaning to the left on or around 01/03/2014, but the deficit significantly worsened around the time of admission. He has undergone MRI evaluation of the brain and cervical spinal cord which showed changes consistent with multiple sclerosis. The patient has Medicaid, and an extended care facility was not covered. The patient returned home, but he only has 3 visits of physical and occupational therapy allow. He has a Marketing executive, and a hospital bed with a  trapeze bar. The patient has a standard wheelchair that is too wide to get out of his room. The patient is unable to use the wheelchair secondary to severe weakness of the left side, almost plegic on the arm and leg. The patient reports no numbness, visual changes, or cognitive changes following the recent MS attack. The patient is completely nonambulatory, and again he requires a lift to get him out of bed into a chair. The patient remains on Tecfidera. Today, he is  using his mother's motorized wheelchair, and he has the cognitive ability and physical ability to use the chair safely. His mother had to rent a wheelchair today so that she too can come into the office.  REVIEW OF SYSTEMS: Out of a complete 14 system review of symptoms, the patient complains only of the following symptoms, and all other reviewed systems are negative.  Blurred vision, constipation, agitation, snoring, sleep talking, insomnia  ALLERGIES: No Known Allergies  HOME MEDICATIONS: Outpatient Prescriptions Prior to Visit  Medication Sig Dispense Refill  . buPROPion (WELLBUTRIN XL) 150 MG 24 hr tablet Take 300 mg by mouth daily.     Marland Kitchen lisinopril (PRINIVIL,ZESTRIL) 10 MG tablet Take 10 mg by mouth daily.    Marland Kitchen lithium carbonate (ESKALITH) 450 MG CR tablet Take 450 mg by mouth 2 (two) times daily.    . traZODone (DESYREL) 100 MG tablet Take 100 mg by mouth at bedtime.     No facility-administered medications prior to visit.    PAST MEDICAL HISTORY: Past Medical History  Diagnosis Date  . ADHD (attention deficit hyperactivity disorder)   . Hypertension   . Schizophrenia   . Bipolar 1 disorder   . 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 Obesity   . Multiple sclerosis 05/20/2013    PAST SURGICAL HISTORY: Past Surgical History  Procedure Laterality Date  . None      FAMILY HISTORY: Family History  Problem Relation Age of Onset  . Diabetes Mother   . ADD / ADHD Brother     SOCIAL HISTORY: History   Social History  . Marital Status: Single    Spouse Name: N/A  . Number of Children: 0  . Years of Education: 11th   Occupational History  .      Disbaled   Social History Main Topics  . Smoking status: Current Every Day Smoker -- 0.25 packs/day    Types: Cigarettes  . Smokeless tobacco: Never Used  . Alcohol Use: 0.0 oz/week    0 Standard drinks or equivalent per week     Comment: sometimes  . Drug Use: Yes    Special: Marijuana     Comment:  Pt denies  . Sexual Activity: Not on file   Other Topics Concern  . Not on file   Social History Narrative   Patient lives at home with his mother.   Disabled.   Education 11 th grade .   Right handed.   Caffeine - one cup daily soda.      PHYSICAL EXAM  Filed Vitals:   09/13/14 1131  BP: 140/77  Pulse: 62  Height:  (1.854 m)  Weight: 324 lb (146.965 kg)   Body mass index is 42.76 kg/(m^2).  Generalized: Well developed, in no acute distress   Neurological examination  Mentation: Alert oriented to time, place, history taking. Follows all commands speech and language fluent Cranial nerve II-XII: Pupils were equal round reactive to light. Extraocular movements were full, visual field  were full on confrontational test. Facial sensation and strength were normal. Uvula tongue midline. Head turning and shoulder shrug  were normal and symmetric. Motor: The motor testing reveals 5 over 5 strength of all 4 extremities. Good symmetric motor tone is noted throughout.  Sensory: Sensory testing is intact to soft touch on all 4 extremities. No evidence of extinction is noted.  Coordination: Cerebellar testing reveals good finger-nose-finger and heel-to-shin bilaterally.  Gait and station: Gait is normal. Tandem gait is normal. Romberg is negative. No drift is seen.  Reflexes: Deep tendon reflexes are symmetric and normal bilaterally.   DIAGNOSTIC DATA (LABS, IMAGING, TESTING) - I reviewed patient records, labs, notes, testing and imaging myself where available.  Lab Results  Component Value Date   WBC 11.6* 08/04/2014   HGB 12.6* 08/04/2014   HCT 39.4 08/04/2014   MCV 78.0 08/04/2014   PLT 245 08/04/2014      Component Value Date/Time   NA 139 08/04/2014 1837   NA 142 06/14/2014 1057   K 3.3* 08/04/2014 1837   CL 104 08/04/2014 1837   CO2 29 08/04/2014 1837   GLUCOSE 94 08/04/2014 1837   GLUCOSE 75 06/14/2014 1057   BUN 9 08/04/2014 1837   BUN 7 06/14/2014 1057    CREATININE 0.97 08/04/2014 1837   CALCIUM 9.4 08/04/2014 1837   PROT 7.0 08/04/2014 1837   PROT 6.5 06/14/2014 1057   ALBUMIN 4.1 08/04/2014 1837   AST 17 08/04/2014 1837   ALT 25 08/04/2014 1837   ALKPHOS 82 08/04/2014 1837   BILITOT 0.6 08/04/2014 1837   GFRNONAA >90 08/04/2014 1837   GFRAA >90 08/04/2014 1837   Lab Results  Component Value Date   CHOL 113 01/29/2011   HDL 33* 01/29/2011   LDLCALC 70 01/29/2011   TRIG 52 01/29/2011   CHOLHDL 3.4 01/29/2011   Lab Results  Component Value Date   HGBA1C 6.1* 01/20/2014    Lab Results  Component Value Date   TSH 0.848 01/18/2014      ASSESSMENT AND PLAN 24 y.o. year old male  has a past medical history of ADHD (attention deficit hyperactivity disorder); Hypertension; Schizophrenia; Bipolar 1 disorder; White matter abnormality on MRI of brain (11/23/2012); Obesity; and Multiple sclerosis (05/20/2013). here with:  1. Multiple sclerosis 2. Bipolar/schizoaffective disorder  The patient's exam today was unremarkable. There was no evidence of the left hand drawing up or the left foot dragging when he was ambulating. I advised the patient and his mom is imperative that he did not miss his next dose of tysabri. They verbalized understanding. I will check blood work today. He will have a repeat MRI at the next visit. I have also advised the patient that he should avoid marijuana and alcohol as he also has a diagnosis of bipolar and schizoaffective disorder. He continues to follow with a psychiatrist. He will follow-up in 3 months with Dr. Anne Hahn or sooner if needed.  I spent 25 minutes with this patient 50% of this time was spent counseling the patient on the signs and symptoms of multiple sclerosis as well as the treatment of Tysabri.   Butch Penny, MSN, NP-C 09/13/2014, 11:45 AM [facility] [address], [address] [place], [place] [address] [phone]  Note: This document was prepared with digital dictation  and possible smart phrase technology. Any transcriptional errors that result from this process are unintentional.

## 2014-09-13 NOTE — Progress Notes (Signed)
I have read the note, and I agree with the clinical assessment and plan.  Brandon [name]   

## 2014-09-13 NOTE — Patient Instructions (Signed)
Do not miss Tysabri treatment this month I will check blood work today.  Recheck MRI in one year.

## 2014-09-14 ENCOUNTER — Telehealth: Payer: Self-pay | Admitting: Adult Health

## 2014-09-14 ENCOUNTER — Other Ambulatory Visit: Payer: Self-pay | Admitting: Neurology

## 2014-09-14 DIAGNOSIS — G35 Multiple sclerosis: Secondary | ICD-10-CM

## 2014-09-14 DIAGNOSIS — G35D Multiple sclerosis, unspecified: Secondary | ICD-10-CM

## 2014-09-14 LAB — CBC WITH DIFFERENTIAL/PLATELET
Basophils Absolute: 0 10*3/uL (ref 0.0–0.2)
Basos: 1 %
Eos: 4 %
Eosinophils Absolute: 0.3 10*3/uL (ref 0.0–0.4)
HCT: 43.6 % (ref 37.5–51.0)
Hemoglobin: 13.6 g/dL (ref 12.6–17.7)
Immature Grans (Abs): 0 10*3/uL (ref 0.0–0.1)
Immature Granulocytes: 0 %
Lymphocytes Absolute: 3.1 10*3/uL (ref 0.7–3.1)
Lymphs: 36 %
MCH: 25 pg — ABNORMAL LOW (ref 26.6–33.0)
MCHC: 31.2 g/dL — ABNORMAL LOW (ref 31.5–35.7)
MCV: 80 fL (ref 79–97)
Monocytes Absolute: 0.5 10*3/uL (ref 0.1–0.9)
Monocytes: 6 %
Neutrophils Absolute: 4.6 10*3/uL (ref 1.4–7.0)
Neutrophils Relative %: 53 %
Platelets: 213 10*3/uL (ref 150–379)
RBC: 5.45 x10E6/uL (ref 4.14–5.80)
RDW: 15.2 % (ref 12.3–15.4)
WBC: 8.6 10*3/uL (ref 3.4–10.8)

## 2014-09-14 LAB — COMPREHENSIVE METABOLIC PANEL
ALT: 21 IU/L (ref 0–44)
AST: 13 IU/L (ref 0–40)
Albumin/Globulin Ratio: 1.8 (ref 1.1–2.5)
Albumin: 4.4 g/dL (ref 3.5–5.5)
Alkaline Phosphatase: 91 IU/L (ref 39–117)
BUN/Creatinine Ratio: 14 (ref 8–19)
BUN: 13 mg/dL (ref 6–20)
Bilirubin Total: 0.4 mg/dL (ref 0.0–1.2)
CO2: 24 mmol/L (ref 18–29)
Calcium: 9.4 mg/dL (ref 8.7–10.2)
Chloride: 101 mmol/L (ref 97–108)
Creatinine, Ser: 0.9 mg/dL (ref 0.76–1.27)
GFR calc Af Amer: 140 mL/min/{1.73_m2} (ref >59)
GFR calc non Af Amer: 121 mL/min/{1.73_m2} (ref >59)
Globulin, Total: 2.4 g/dL (ref 1.5–4.5)
Glucose: 84 mg/dL (ref 65–99)
Potassium: 4.3 mmol/L (ref 3.5–5.2)
Sodium: 139 mmol/L (ref 134–144)
Total Protein: 6.8 g/dL (ref 6.0–8.5)

## 2014-09-14 NOTE — Telephone Encounter (Signed)
error 

## 2014-09-14 NOTE — Progress Notes (Signed)
Quick Note:  Called and left message relayed labs were ok waiting on JC antivirus to come back. ______

## 2014-09-18 ENCOUNTER — Encounter (HOSPITAL_COMMUNITY): Payer: Self-pay | Admitting: Emergency Medicine

## 2014-09-18 ENCOUNTER — Emergency Department (HOSPITAL_COMMUNITY): Payer: Medicare Other

## 2014-09-18 ENCOUNTER — Emergency Department (HOSPITAL_COMMUNITY)
Admission: EM | Admit: 2014-09-18 | Discharge: 2014-09-18 | Disposition: A | Discharge status: Home / Self Care | Payer: Medicare Other | Attending: Emergency Medicine | Admitting: Emergency Medicine

## 2014-09-18 DIAGNOSIS — R52 Pain, unspecified: Secondary | ICD-10-CM

## 2014-09-18 DIAGNOSIS — E669 Obesity, unspecified: Secondary | ICD-10-CM | POA: Diagnosis not present

## 2014-09-18 DIAGNOSIS — F319 Bipolar disorder, unspecified: Secondary | ICD-10-CM | POA: Insufficient documentation

## 2014-09-18 DIAGNOSIS — M25572 Pain in left ankle and joints of left foot: Secondary | ICD-10-CM | POA: Diagnosis present

## 2014-09-18 DIAGNOSIS — I10 Essential (primary) hypertension: Secondary | ICD-10-CM | POA: Diagnosis not present

## 2014-09-18 DIAGNOSIS — Y9289 Other specified places as the place of occurrence of the external cause: Secondary | ICD-10-CM | POA: Diagnosis not present

## 2014-09-18 DIAGNOSIS — X58XXXA Exposure to other specified factors, initial encounter: Secondary | ICD-10-CM | POA: Diagnosis not present

## 2014-09-18 DIAGNOSIS — M25552 Pain in left hip: Secondary | ICD-10-CM | POA: Diagnosis not present

## 2014-09-18 DIAGNOSIS — Y998 Other external cause status: Secondary | ICD-10-CM | POA: Insufficient documentation

## 2014-09-18 DIAGNOSIS — Z720 Tobacco use: Secondary | ICD-10-CM | POA: Diagnosis not present

## 2014-09-18 DIAGNOSIS — Z8669 Personal history of other diseases of the nervous system and sense organs: Secondary | ICD-10-CM | POA: Diagnosis not present

## 2014-09-18 DIAGNOSIS — Z79899 Other long term (current) drug therapy: Secondary | ICD-10-CM | POA: Insufficient documentation

## 2014-09-18 DIAGNOSIS — Y9301 Activity, walking, marching and hiking: Secondary | ICD-10-CM | POA: Diagnosis not present

## 2014-09-18 DIAGNOSIS — S93402A Sprain of unspecified ligament of left ankle, initial encounter: Secondary | ICD-10-CM | POA: Insufficient documentation

## 2014-09-18 MED ORDER — IBUPROFEN 200 MG PO TABS
400.0000 mg | ORAL_TABLET | Freq: Once | ORAL | Status: AC
  Administered 2014-09-18: 400 mg via ORAL
  Filled 2014-09-18: qty 2

## 2014-09-18 MED ORDER — CYCLOBENZAPRINE HCL 10 MG PO TABS
10.0000 mg | ORAL_TABLET | Freq: Once | ORAL | Status: AC
  Administered 2014-09-18: 10 mg via ORAL
  Filled 2014-09-18: qty 1

## 2014-09-18 MED ORDER — CYCLOBENZAPRINE HCL 10 MG PO TABS: 10.0000 mg | ORAL_TABLET | Freq: Two times a day (BID) | ORAL | Status: DC | PRN

## 2014-09-18 NOTE — Discharge Instructions (Signed)
For pain control you may take up to 800mg of ibuprofen (that is usually 4 over the counter pills)  3 times a day (take with food) and acetaminophen 975mg (this is 3 over the counter pills) four times a day. Do not drink alcohol or combine with other medications that have acetaminophen as an ingredient (Read the labels!).  ° ° For breakthrough pain you may take Flexeril. Do not drink alcohol, drive or operate heavy machinery when taking Flexeril. ° °Please follow with your primary care doctor in the next 2 days for a check-up. They must obtain records for further management.  ° °Do not hesitate to return to the Emergency Department for any new, worsening or concerning symptoms.  ° ° °

## 2014-09-18 NOTE — ED Provider Notes (Signed)
CSN: [id number]     Arrival date & time 09/18/14  2101 History   First MD Initiated Contact with Patient 09/18/14 2114     Chief Complaint  Patient presents with  . Ankle Pain  . Hip Pain     (Consider location/radiation/quality/duration/timing/severity/associated sxs/prior Treatment) HPI  ZYHEIR EASTER is a 24 y.o. male complaining of left ankle and left hip pain status post twisting ankle while walking earlier in the day. Rates his pain at 10 out of 10, states is exacerbated by movement and palpation. Described as sharp. Patient is ambulatory. He has no prior history of trauma or surgeries to the affected joint. No pain medication taken prior to arrival.  Past Medical History  Diagnosis Date  . ADHD (attention deficit hyperactivity disorder)   . Hypertension   . Schizophrenia   . Bipolar 1 disorder   . 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 Obesity   . Multiple sclerosis 05/20/2013   Past Surgical History  Procedure Laterality Date  . None     Family History  Problem Relation Age of Onset  . Diabetes Mother   . ADD / ADHD Brother    History  Substance Use Topics  . Smoking status: Current Every Day Smoker -- 0.25 packs/day    Types: Cigarettes  . Smokeless tobacco: Never Used  . Alcohol Use: 0.0 oz/week    0 Standard drinks or equivalent per week     Comment: sometimes    Review of Systems  10 systems reviewed and found to be negative, except as noted in the HPI.   Allergies  Review of patient's allergies indicates no known allergies.  Home Medications   Prior to Admission medications   Medication Sig Start Date End Date Taking? Authorizing Provider  amLODipine (NORVASC) 10 MG tablet Take 10 mg by mouth daily. 09/04/14  Yes Historical Provider, MD  buPROPion (WELLBUTRIN XL) 150 MG 24 hr tablet Take 300 mg by mouth daily.    Yes Historical Provider, MD  cloNIDine (CATAPRES) 0.1 MG tablet Take 0.1 mg by mouth daily. 09/04/14  Yes Historical  Provider, MD  fluPHENAZine (PROLIXIN) 10 MG tablet Take 10 mg by mouth daily.  08/17/14  Yes Historical Provider, MD  lisinopril (PRINIVIL,ZESTRIL) 10 MG tablet Take 10 mg by mouth daily.   Yes Historical Provider, MD  lithium carbonate (ESKALITH) 450 MG CR tablet Take 450 mg by mouth 2 (two) times daily.   Yes Historical Provider, MD  traZODone (DESYREL) 100 MG tablet Take 100 mg by mouth at bedtime.   Yes Historical Provider, MD  cyclobenzaprine (FLEXERIL) 10 MG tablet Take 1 tablet (10 mg total) by mouth 2 (two) times daily as needed for muscle spasms. 09/18/14   Sherece Gambrill, PA-C   BP 108/84 mmHg  Pulse 77  Temp(Src) 98.7 F (37.1 C) (Oral)  Resp 16  SpO2 95% Physical Exam  Constitutional: He is oriented to person, place, and time. He appears well-developed and well-nourished. No distress.  Obese  HENT:  Head: Normocephalic.  Eyes: Conjunctivae and EOM are normal.  Cardiovascular: Normal rate.   Pulmonary/Chest: Effort normal. No stridor.  Musculoskeletal: Normal range of motion.  Mildly diffusely tender to palpation along the left foot and ankle. No focal tenderness along the malleoli. He is distally neurovascularly intact, excellent range of motion to left hip. Ambulates with a coordinated in nonantalgic gait.  Neurological: He is alert and oriented to person, place, and time.  Psychiatric: He has a normal  mood and affect.  Nursing note and vitals reviewed.   ED Course  Procedures (including critical care time) Labs Review Labs Reviewed - No data to display  Imaging Review Dg Ankle Complete Left  09/18/2014   CLINICAL DATA:  Acute onset left ankle pain while walking this evening. Initial encounter.  EXAM: LEFT ANKLE COMPLETE - 3+ VIEW  COMPARISON:  None.  FINDINGS: No acute bony or joint abnormality is identified. Tiny bone island in the distal tibia is incidentally noted. Soft tissues are unremarkable.  IMPRESSION: Negative exam   Electronically Signed   By: Drusilla Kanner M.D.   On: 09/18/2014 21:47   Dg Hip Unilat With Pelvis 2-3 Views Left  09/18/2014   CLINICAL DATA:  Posterior left hip pain while walking  EXAM: LEFT HIP (WITH PELVIS) 2-3 VIEWS  COMPARISON:  None.   Electronically Signed   By: Ellery Plunk M.D.   On: 09/18/2014 21:46     EKG Interpretation None      MDM   Final diagnoses:  Pain  Ankle sprain, left, initial encounter    Filed Vitals:   09/18/14 2104 09/18/14 2109  BP:  108/84  Pulse:  77  Temp:  98.7 F (37.1 C)  TempSrc:  Oral  Resp:  16  SpO2: 98% 95%    Medications  ibuprofen (ADVIL,MOTRIN) tablet 400 mg (400 mg Oral Given 09/18/14 2139)  cyclobenzaprine (FLEXERIL) tablet 10 mg (10 mg Oral Given 09/18/14 2140)    [name] is a pleasant 24 y.o. male presenting with left ankle and hip pain after rolling the ankle just prior to arrival. Patient is ambulatory without issue. Neurovascularly intact with mild tenderness to palpation. X-rays negative. Will treat with rest, ice, compression elevation, NSAIDS. Patient given crutches and an orthopedic follow-up.    Evaluation does not show pathology that would require ongoing emergent intervention or inpatient treatment. Pt is hemodynamically stable and mentating appropriately. Discussed findings and plan with patient/guardian, who agrees with care plan. All questions answered. Return precautions discussed and outpatient follow up given.   Discharge Medication List as of 09/18/2014  9:59 PM    START taking these medications   Details  cyclobenzaprine (FLEXERIL) 10 MG tablet Take 1 tablet (10 mg total) by mouth 2 (two) times daily as needed for muscle spasms., Starting 09/18/2014, Until Discontinued, Print             Wynetta Emery, PA-C 09/18/14 9604  Eber Hong, MD 09/19/14 1145

## 2014-09-18 NOTE — ED Notes (Signed)
Per EMS, Pt was walking in the grass and twisted his L ankle in a hole. Pt has some abrasion to L ankle area. Pt also c/o L hip pain. Pt ambulatory on scene, ambulatory in ED. No obvious deformity. C/o 10/10 pain in hip and 6/10 pain in ankle. A&Ox4.

## 2014-09-21 ENCOUNTER — Encounter (HOSPITAL_COMMUNITY)
Admission: RE | Admit: 2014-09-21 | Discharge: 2014-09-21 | Disposition: A | Discharge status: Home / Self Care | Payer: Medicare Other | Source: Ambulatory Visit | Attending: Neurology | Admitting: Neurology

## 2014-09-21 VITALS — BP 146/94 | HR 76 | Temp 97.6°F | Resp 20 | Ht 73.0 in | Wt 324.0 lb

## 2014-09-21 DIAGNOSIS — G35 Multiple sclerosis: Secondary | ICD-10-CM | POA: Insufficient documentation

## 2014-09-21 MED ORDER — ACETAMINOPHEN 325 MG PO TABS
650.0000 mg | ORAL_TABLET | ORAL | Status: DC
  Administered 2014-09-21: 650 mg via ORAL
  Filled 2014-09-21: qty 2

## 2014-09-21 MED ORDER — SODIUM CHLORIDE 0.9 % IV SOLN
300.0000 mg | INTRAVENOUS | Status: DC
  Administered 2014-09-21: 300 mg via INTRAVENOUS
  Filled 2014-09-21: qty 15

## 2014-09-21 MED ORDER — SODIUM CHLORIDE 0.9 % IV SOLN
250.0000 mL | INTRAVENOUS | Status: DC
  Administered 2014-09-21: 250 mL via INTRAVENOUS

## 2014-09-21 MED ORDER — LORATADINE 10 MG PO TABS
10.0000 mg | ORAL_TABLET | ORAL | Status: DC
  Administered 2014-09-21: 10 mg via ORAL
  Filled 2014-09-21: qty 1

## 2014-09-21 NOTE — Progress Notes (Signed)
Uneventful infusion of Tysabri. Pt slept soundly during infusion and for the recommended 1 hour post infusion observation. Mom in room with patient. Pt ambulated to BR unassisted and Mom stated" after discharge she wanted to take him to get a sandwich downstairs". Pt was discharged ambulatory accompanied by [facility]

## 2014-09-25 ENCOUNTER — Telehealth: Payer: Self-pay | Admitting: Adult Health

## 2014-09-25 NOTE — Progress Notes (Signed)
Quick Note:  Called and relayed on message JC anti body was negative. ______

## 2014-09-25 NOTE — Telephone Encounter (Signed)
JC antibody negative. Please call the patient.

## 2014-09-25 NOTE — Telephone Encounter (Signed)
Called patient and relayed on message JC antibody was negative.

## 2014-10-19 ENCOUNTER — Encounter (HOSPITAL_COMMUNITY): Payer: Self-pay

## 2014-10-19 ENCOUNTER — Encounter (HOSPITAL_COMMUNITY)
Admission: RE | Admit: 2014-10-19 | Discharge: 2014-10-19 | Disposition: A | Discharge status: Home / Self Care | Payer: Medicare Other | Source: Ambulatory Visit | Attending: Neurology | Admitting: Neurology

## 2014-10-19 VITALS — BP 156/83 | HR 64 | Temp 97.8°F | Resp 18 | Ht 73.0 in | Wt 324.0 lb

## 2014-10-19 DIAGNOSIS — G35 Multiple sclerosis: Secondary | ICD-10-CM | POA: Diagnosis present

## 2014-10-19 MED ORDER — SODIUM CHLORIDE 0.9 % IV SOLN
250.0000 mL | INTRAVENOUS | Status: DC
  Administered 2014-10-19: 250 mL via INTRAVENOUS

## 2014-10-19 MED ORDER — LORATADINE 10 MG PO TABS
10.0000 mg | ORAL_TABLET | ORAL | Status: DC
  Administered 2014-10-19: 10 mg via ORAL
  Filled 2014-10-19: qty 1

## 2014-10-19 MED ORDER — SODIUM CHLORIDE 0.9 % IV SOLN
300.0000 mg | INTRAVENOUS | Status: DC
  Administered 2014-10-19: 300 mg via INTRAVENOUS
  Filled 2014-10-19: qty 15

## 2014-10-19 MED ORDER — ACETAMINOPHEN 325 MG PO TABS
650.0000 mg | ORAL_TABLET | ORAL | Status: DC
  Administered 2014-10-19: 650 mg via ORAL
  Filled 2014-10-19: qty 2

## 2014-10-19 NOTE — Progress Notes (Signed)
Patient's iv started with NS. Taped down very well as patient has history of removing ivs in past.  Premeds given. RN went to hang Tysabri and noticed area wet around iv. Unwrapped tape and iv catheter resting on top of the skin, intact. Patient stated "I didn't take it out". Mother at bedside. Short Stay RN attempted one other time and patient moving arm so unable to start. IV team consult made. Patient and mom made aware of importance of leaving iv in to be able to receive medicine.

## 2014-10-19 NOTE — Progress Notes (Signed)
Patient completed his Tysabri treatment. Flushed with NS per protocol. Patient and mother stated they had to leave after out of the hour observation period as their transportation would be arriving. Patient and mother made aware to call Dr. Anne Hahn if any problems or questions. Verbalized understanding. Left at 1155am.

## 2014-10-19 NOTE — Discharge Instructions (Signed)
IF YOU ARE GOING TO BE 15 OR MINUTES LATE FOR YOUR APPOINTMENT, PLEASE CALL [phone] TO MAKE OTHER ARRANGEMENTS FOR YOUR TREATMENT  IF YOU ARRIVE EARLY FOR YOUR SCHEDULED APPOINTMENT , YOU MAY HAVE TO WAIT UNTIL YOUR SCHEDULED TIME.IF YOU ARE GOING TO BE 15 OR MINUTES LATE FOR YOUR APPOINTMENT, PLEASE CALL [phone] TO MAKE OTHER ARRANGEMENTS FOR YOUR TREATMENT  IF YOU ARRIVE EARLY FOR YOUR SCHEDULED APPOINTMENT , YOU MAY HAVE TO WAIT UNTIL YOUR SCHEDULED TIME    Tysabri  Natalizumab injection What is this medicine? NATALIZUMAB (na ta LIZ you mab) is used to treat relapsing multiple sclerosis. This drug is not a cure. It is also used to treat Crohn's disease. This medicine may be used for other purposes; ask your health care provider or pharmacist if you have questions. COMMON BRAND NAME(S): Tysabri What should I tell my health care provider before I take this medicine? They need to know if you have any of these conditions: -immune system problems -progressive multifocal leukoencephalopathy (PML) -an unusual or allergic reaction to natalizumab, other medicines, foods, dyes, or preservatives -pregnant or trying to get pregnant -breast-feeding How should I use this medicine? This medicine is for infusion into a vein. It is given by a health care professional in a hospital or clinic setting. A special MedGuide will be given to you by the pharmacist with each prescription and refill. Be sure to read this information carefully each time. Talk to your pediatrician regarding the use of this medicine in children. This medicine is not approved for use in children. Overdosage: If you think you have taken too much of this medicine contact a poison control center or emergency room at once. NOTE: This medicine is only for you. Do not share this medicine with others. What if I miss a dose? It is important not to miss your dose. Call your doctor or health care professional if you are unable to keep an  appointment. What may interact with this medicine? -azathioprine -cyclosporine -interferon -6-mercaptopurine -methotrexate -steroid medicines like prednisone or cortisone -TNF-alpha inhibitors like adalimumab, etanercept, and infliximab -vaccines This list may not describe all possible interactions. Give your health care provider a list of all the medicines, herbs, non-prescription drugs, or dietary supplements you use. Also tell them if you smoke, drink alcohol, or use illegal drugs. Some items may interact with your medicine. What should I watch for while using this medicine? Your condition will be monitored carefully while you are receiving this medicine. Visit your doctor for regular check ups. Tell your doctor or healthcare professional if your symptoms do not start to get better or if they get worse. Stay away from people who are sick. Call your doctor or health care professional for advice if you get a fever, chills or sore throat, or other symptoms of a cold or flu. Do not treat yourself. In some patients, this medicine may cause a serious brain infection that may cause death. If you have any problems seeing, thinking, speaking, walking, or standing, tell your doctor right away. If you cannot reach your doctor, get urgent medical care. What side effects may I notice from receiving this medicine? Side effects that you should report to your doctor or health care professional as soon as possible: -allergic reactions like skin rash, itching or hives, swelling of the face, lips, or tongue -breathing problems -changes in vision -chest pain -dark urine -depression, feelings of sadness -dizziness -general ill feeling or flu-like symptoms -irregular, missed, or painful menstrual periods -  light-colored stools -loss of appetite, nausea -muscle weakness -problems with balance, talking, or walking -right upper belly pain -unusually weak or tired -yellowing of the eyes or skin Side effects  that usually do not require medical attention (report to your doctor or health care professional if they continue or are bothersome): -aches, pains -headache -stomach upset -tiredness This list may not describe all possible side effects. Call your doctor for medical advice about side effects. You may report side effects to FDA at 1-800-FDA-1088. Where should I keep my medicine? This drug is given in a hospital or clinic and will not be stored at home. NOTE: This sheet is a summary. It may not cover all possible information. If you have questions about this medicine, talk to your doctor, pharmacist, or health care provider. . 2015, [employer]vier/Gold Standard. (2008-08-12 13:33:21)

## 2014-10-29 ENCOUNTER — Emergency Department (HOSPITAL_COMMUNITY)
Admission: EM | Admit: 2014-10-29 | Discharge: 2014-10-29 | Disposition: A | Discharge status: Home / Self Care | Payer: Medicare Other | Attending: Emergency Medicine | Admitting: Emergency Medicine

## 2014-10-29 ENCOUNTER — Encounter (HOSPITAL_COMMUNITY): Payer: Self-pay | Admitting: Emergency Medicine

## 2014-10-29 ENCOUNTER — Emergency Department (HOSPITAL_COMMUNITY): Payer: Medicare Other

## 2014-10-29 DIAGNOSIS — Z79899 Other long term (current) drug therapy: Secondary | ICD-10-CM | POA: Diagnosis not present

## 2014-10-29 DIAGNOSIS — W2209XA Striking against other stationary object, initial encounter: Secondary | ICD-10-CM | POA: Diagnosis not present

## 2014-10-29 DIAGNOSIS — S24109A Unspecified injury at unspecified level of thoracic spinal cord, initial encounter: Secondary | ICD-10-CM | POA: Insufficient documentation

## 2014-10-29 DIAGNOSIS — F209 Schizophrenia, unspecified: Secondary | ICD-10-CM | POA: Diagnosis not present

## 2014-10-29 DIAGNOSIS — F319 Bipolar disorder, unspecified: Secondary | ICD-10-CM | POA: Diagnosis not present

## 2014-10-29 DIAGNOSIS — Y999 Unspecified external cause status: Secondary | ICD-10-CM | POA: Insufficient documentation

## 2014-10-29 DIAGNOSIS — S79912A Unspecified injury of left hip, initial encounter: Secondary | ICD-10-CM | POA: Insufficient documentation

## 2014-10-29 DIAGNOSIS — Z720 Tobacco use: Secondary | ICD-10-CM | POA: Insufficient documentation

## 2014-10-29 DIAGNOSIS — Y929 Unspecified place or not applicable: Secondary | ICD-10-CM | POA: Diagnosis not present

## 2014-10-29 DIAGNOSIS — F909 Attention-deficit hyperactivity disorder, unspecified type: Secondary | ICD-10-CM | POA: Diagnosis not present

## 2014-10-29 DIAGNOSIS — Y9389 Activity, other specified: Secondary | ICD-10-CM | POA: Diagnosis not present

## 2014-10-29 DIAGNOSIS — W19XXXA Unspecified fall, initial encounter: Secondary | ICD-10-CM

## 2014-10-29 DIAGNOSIS — E669 Obesity, unspecified: Secondary | ICD-10-CM | POA: Diagnosis not present

## 2014-10-29 DIAGNOSIS — Z8669 Personal history of other diseases of the nervous system and sense organs: Secondary | ICD-10-CM | POA: Insufficient documentation

## 2014-10-29 DIAGNOSIS — I10 Essential (primary) hypertension: Secondary | ICD-10-CM | POA: Insufficient documentation

## 2014-10-29 MED ORDER — IBUPROFEN 800 MG PO TABS
800.0000 mg | ORAL_TABLET | Freq: Once | ORAL | Status: AC
  Administered 2014-10-29: 800 mg via ORAL
  Filled 2014-10-29: qty 1

## 2014-10-29 NOTE — Discharge Instructions (Signed)
Hip Pain Brandon [name], Continue to take motrin or tylenol as needed for pain.  See a primary physician within 3 days for close follow up.  If symptoms worsen, come back to the ED immediately.  Thank you. Your hip is the joint between your upper legs and your lower pelvis. The bones, cartilage, tendons, and muscles of your hip joint perform a lot of work each day supporting your body weight and allowing you to move around. Hip pain can range from a minor ache to severe pain in one or both of your hips. Pain may be felt on the inside of the hip joint near the groin, or the outside near the buttocks and upper thigh. You may have swelling or stiffness as well.  HOME CARE INSTRUCTIONS   Take medicines only as directed by your health care provider.  Apply ice to the injured area:  Put ice in a plastic bag.  Place a towel between your skin and the bag.  Leave the ice on for 15-20 minutes at a time, 3-4 times a day.  Keep your leg raised (elevated) when possible to lessen swelling.  Avoid activities that cause pain.  Follow specific exercises as directed by your health care provider.  Sleep with a pillow between your legs on your most comfortable side.  Record how often you have hip pain, the location of the pain, and what it feels like. SEEK MEDICAL CARE IF:   You are unable to put weight on your leg.  Your hip is red or swollen or very tender to touch.  Your pain or swelling continues or worsens after 1 week.  You have increasing difficulty walking.  You have a fever. SEEK IMMEDIATE MEDICAL CARE IF:   You have fallen.  You have a sudden increase in pain and swelling in your hip. MAKE SURE YOU:   Understand these instructions.  Will watch your condition.  Will get help right away if you are not doing well or get worse. Document Released: 12/11/2009 Document Revised: 11/07/2013 Document Reviewed: 02/17/2013 [facility] Patient Information 2015 [place], [place]. This information is  not intended to replace advice given to you by your health care provider. Make sure you discuss any questions you have with your health care provider. Back Injury Prevention The following tips can help you to prevent a back injury. PHYSICAL FITNESS  Exercise often. Try to develop strong stomach (abdominal) muscles.  Do aerobic exercises often. This includes walking, jogging, biking, swimming.  Do exercises that help with balance and strength often. This includes tai chi and yoga.  Stretch before and after you exercise.  Keep a healthy weight. DIET   Ask your doctor how much calcium and vitamin D you need every day.  Include calcium in your diet. Foods high in calcium include dairy products; green, leafy vegetables; and products with calcium added (fortified).  Include vitamin D in your diet. Foods high in vitamin D include milk and products with vitamin D added.  Think about taking a multivitamin or other nutritional products called " supplements."  Stop smoking if you smoke. POSTURE   Sit and stand up straight. Avoid leaning forward or hunching over.  Choose chairs that support your lower back.  If you work at a desk:  Sit close to your work so you do not lean over.  Keep your chin tucked in.  Keep your neck drawn back.  Keep your elbows bent at a right angle. Your arms should look like the letter "L."  Sit high and close to the steering wheel when you drive. Add low back support to your car seat if needed.  Avoid sitting or standing in one position for too long. Get up and move around every hour. Take breaks if you are driving for a long time.  Sleep on your side with your knees slightly bent. You can also sleep on your back with a pillow under your knees. Do not sleep on your stomach. LIFTING, TWISTING, AND REACHING  Avoid heavy lifting, especially lifting over and over again. If you must do heavy lifting:  Stretch before lifting.  Work slowly.  Rest between  lifts.  Use carts and dollies to move objects when possible.  Make several small trips instead of carrying 1 heavy load.  Ask for help when you need it.  Ask for help when moving big, awkward objects.  Follow these steps when lifting:  Stand with your feet shoulder-width apart.  Get as close to the object as you can. Do not pick up heavy objects that are far from your body.  Use handles or lifting straps when possible.  Bend at your knees. Squat down, but keep your heels off the floor.  Keep your shoulders back, your chin tucked in, and your back straight.  Lift the object slowly. Tighten the muscles in your legs, stomach, and butt. Keep the object as close to the center of your body as possible.  Reverse these directions when you put a load down.  Do not:  Lift the object above your waist.  Twist at the waist while lifting or carrying a load. Move your feet if you need to turn, not your waist.  Bend over without bending at your knees.  Avoid reaching over your head, across a table, or for an object on a high surface. OTHER TIPS  Avoid wet floors and keep sidewalks clear of ice.  Do not sleep on a mattress that is too soft or too hard.  Keep items that you use often within easy reach.  Put heavier objects on shelves at waist level. Put lighter objects on lower or higher shelves.  Find ways to lessen your stress. You can try exercise, massage, or relaxation.  Get help for depression or anxiety if needed. GET HELP IF:  You injure your back.  You have questions about diet, exercise, or other ways to prevent back injuries. MAKE SURE YOU:  Understand these instructions.  Will watch your condition.  Will get help right away if you are not doing well or get worse. Document Released: 12/10/2007 Document Revised: 09/15/2011 Document Reviewed: 08/04/2011 [facility] Patient Information 2015 [place], [place]. This information is not intended to replace advice given to  you by your health care provider. Make sure you discuss any questions you have with your health care provider.
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
## 2014-10-29 NOTE — ED Notes (Addendum)
Pt arrives via EMS with complaints of back pain ongoing for four years. Pt very poor historian. States he's "never felt pain like this before but I've had it for four years." Sharp, throbbing pain in lower left back. Admits to multiple blunts today, speech mildly slurred. Ambulatory with a steady gait, denies loss of control of B&B.

## 2014-10-29 NOTE — ED Notes (Signed)
Pt falling asleep during triage and talking to self.

## 2014-10-29 NOTE — ED Notes (Signed)
Pt verbalized understanding of d/c instructions and has no further questions.  

## 2014-11-16 ENCOUNTER — Encounter (HOSPITAL_COMMUNITY)
Admission: RE | Admit: 2014-11-16 | Discharge: 2014-11-16 | Disposition: A | Discharge status: Home / Self Care | Payer: Medicare Other | Source: Ambulatory Visit | Attending: Neurology | Admitting: Neurology

## 2014-11-16 ENCOUNTER — Encounter (HOSPITAL_COMMUNITY): Payer: Self-pay

## 2014-11-16 VITALS — BP 129/104 | HR 65 | Temp 98.3°F | Resp 18 | Ht 73.0 in | Wt 297.0 lb

## 2014-11-16 DIAGNOSIS — G35 Multiple sclerosis: Secondary | ICD-10-CM | POA: Insufficient documentation

## 2014-11-16 MED ORDER — LORATADINE 10 MG PO TABS
10.0000 mg | ORAL_TABLET | ORAL | Status: AC
  Administered 2014-11-16: 10 mg via ORAL
  Filled 2014-11-16: qty 1

## 2014-11-16 MED ORDER — SODIUM CHLORIDE 0.9 % IV SOLN
250.0000 mL | INTRAVENOUS | Status: DC
  Administered 2014-11-16: 250 mL via INTRAVENOUS

## 2014-11-16 MED ORDER — SODIUM CHLORIDE 0.9 % IV SOLN
300.0000 mg | INTRAVENOUS | Status: DC
  Administered 2014-11-16: 300 mg via INTRAVENOUS
  Filled 2014-11-16: qty 15

## 2014-11-16 MED ORDER — ACETAMINOPHEN 325 MG PO TABS
650.0000 mg | ORAL_TABLET | ORAL | Status: AC
  Administered 2014-11-16: 650 mg via ORAL
  Filled 2014-11-16: qty 2

## 2014-12-08 ENCOUNTER — Emergency Department (HOSPITAL_COMMUNITY)
Admission: EM | Admit: 2014-12-08 | Discharge: 2014-12-11 | Disposition: A | Discharge status: Home / Self Care | Payer: Medicare Other | Attending: Emergency Medicine | Admitting: Emergency Medicine

## 2014-12-08 ENCOUNTER — Encounter (HOSPITAL_COMMUNITY): Payer: Self-pay | Admitting: Emergency Medicine

## 2014-12-08 DIAGNOSIS — Z8669 Personal history of other diseases of the nervous system and sense organs: Secondary | ICD-10-CM | POA: Diagnosis not present

## 2014-12-08 DIAGNOSIS — Z79899 Other long term (current) drug therapy: Secondary | ICD-10-CM | POA: Insufficient documentation

## 2014-12-08 DIAGNOSIS — R45850 Homicidal ideations: Secondary | ICD-10-CM

## 2014-12-08 DIAGNOSIS — F29 Unspecified psychosis not due to a substance or known physiological condition: Secondary | ICD-10-CM

## 2014-12-08 DIAGNOSIS — F1210 Cannabis abuse, uncomplicated: Secondary | ICD-10-CM | POA: Insufficient documentation

## 2014-12-08 DIAGNOSIS — R45851 Suicidal ideations: Secondary | ICD-10-CM | POA: Diagnosis not present

## 2014-12-08 DIAGNOSIS — I10 Essential (primary) hypertension: Secondary | ICD-10-CM | POA: Diagnosis not present

## 2014-12-08 DIAGNOSIS — F259 Schizoaffective disorder, unspecified: Secondary | ICD-10-CM | POA: Diagnosis not present

## 2014-12-08 DIAGNOSIS — Z046 Encounter for general psychiatric examination, requested by authority: Secondary | ICD-10-CM | POA: Diagnosis present

## 2014-12-08 DIAGNOSIS — Z720 Tobacco use: Secondary | ICD-10-CM | POA: Insufficient documentation

## 2014-12-08 NOTE — ED Notes (Signed)
Bed: WTR9 Expected date:  Expected time:  Means of arrival:  Comments: 
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
## 2014-12-09 DIAGNOSIS — F259 Schizoaffective disorder, unspecified: Secondary | ICD-10-CM

## 2014-12-09 DIAGNOSIS — F29 Unspecified psychosis not due to a substance or known physiological condition: Secondary | ICD-10-CM | POA: Diagnosis not present

## 2014-12-09 DIAGNOSIS — R45851 Suicidal ideations: Secondary | ICD-10-CM | POA: Diagnosis not present

## 2014-12-09 LAB — COMPREHENSIVE METABOLIC PANEL
ALT: 29 U/L (ref 17–63)
AST: 17 U/L (ref 15–41)
Albumin: 4.1 g/dL (ref 3.5–5.0)
Alkaline Phosphatase: 78 U/L (ref 38–126)
Anion gap: 9 (ref 5–15)
BUN: 8 mg/dL (ref 6–20)
CO2: 25 mmol/L (ref 22–32)
Calcium: 9.4 mg/dL (ref 8.9–10.3)
Chloride: 107 mmol/L (ref 101–111)
Creatinine, Ser: 0.89 mg/dL (ref 0.61–1.24)
GFR calc Af Amer: >60 mL/min (ref >60)
GFR calc non Af Amer: >60 mL/min (ref >60)
Glucose, Bld: 118 mg/dL — ABNORMAL HIGH (ref 65–99)
Potassium: 3 mmol/L — ABNORMAL LOW (ref 3.5–5.1)
Sodium: 141 mmol/L (ref 135–145)
Total Bilirubin: 0.9 mg/dL (ref 0.3–1.2)
Total Protein: 7 g/dL (ref 6.5–8.1)

## 2014-12-09 LAB — CBC
HCT: 40.2 % (ref 39.0–52.0)
Hemoglobin: 12.9 g/dL — ABNORMAL LOW (ref 13.0–17.0)
MCH: 26.1 pg (ref 26.0–34.0)
MCHC: 32.1 g/dL (ref 30.0–36.0)
MCV: 81.2 fL (ref 78.0–100.0)
Platelets: 194 10*3/uL (ref 150–400)
RBC: 4.95 MIL/uL (ref 4.22–5.81)
RDW: 15.8 % — ABNORMAL HIGH (ref 11.5–15.5)
WBC: 10.9 10*3/uL — ABNORMAL HIGH (ref 4.0–10.5)

## 2014-12-09 LAB — ACETAMINOPHEN LEVEL: Acetaminophen (Tylenol), Serum: <10 ug/mL — ABNORMAL LOW (ref 10–30)

## 2014-12-09 LAB — RAPID URINE DRUG SCREEN, HOSP PERFORMED
Amphetamines: NOT DETECTED
Barbiturates: NOT DETECTED
Benzodiazepines: NOT DETECTED
Cocaine: NOT DETECTED
Opiates: NOT DETECTED
Tetrahydrocannabinol: POSITIVE — AB

## 2014-12-09 LAB — SALICYLATE LEVEL: Salicylate Lvl: <4 mg/dL (ref 2.8–30.0)

## 2014-12-09 LAB — ETHANOL: Alcohol, Ethyl (B): <5 mg/dL (ref <5)

## 2014-12-09 MED ORDER — BENZTROPINE MESYLATE 1 MG PO TABS
0.5000 mg | ORAL_TABLET | Freq: Two times a day (BID) | ORAL | Status: DC
  Administered 2014-12-09 – 2014-12-11 (×5): 0.5 mg via ORAL
  Filled 2014-12-09 (×5): qty 1

## 2014-12-09 MED ORDER — ONDANSETRON HCL 4 MG PO TABS: 4.0000 mg | ORAL_TABLET | Freq: Three times a day (TID) | ORAL | Status: DC | PRN

## 2014-12-09 MED ORDER — TRAZODONE HCL 100 MG PO TABS
100.0000 mg | ORAL_TABLET | Freq: Every evening | ORAL | Status: DC | PRN
  Administered 2014-12-10: 100 mg via ORAL
  Filled 2014-12-09: qty 1

## 2014-12-09 MED ORDER — ACETAMINOPHEN 325 MG PO TABS: 650.0000 mg | ORAL_TABLET | ORAL | Status: DC | PRN

## 2014-12-09 MED ORDER — LORAZEPAM 1 MG PO TABS
1.0000 mg | ORAL_TABLET | Freq: Four times a day (QID) | ORAL | Status: DC | PRN
  Administered 2014-12-09 – 2014-12-10 (×3): 1 mg via ORAL
  Filled 2014-12-09 (×3): qty 1

## 2014-12-09 MED ORDER — LITHIUM CARBONATE ER 450 MG PO TBCR
450.0000 mg | EXTENDED_RELEASE_TABLET | Freq: Two times a day (BID) | ORAL | Status: DC
  Administered 2014-12-09 – 2014-12-11 (×5): 450 mg via ORAL
  Filled 2014-12-09 (×6): qty 1

## 2014-12-09 MED ORDER — POTASSIUM CHLORIDE CRYS ER 20 MEQ PO TBCR
40.0000 meq | EXTENDED_RELEASE_TABLET | Freq: Once | ORAL | Status: AC
  Administered 2014-12-09: 40 meq via ORAL
  Filled 2014-12-09: qty 2

## 2014-12-09 MED ORDER — ARIPIPRAZOLE 5 MG PO TABS
5.0000 mg | ORAL_TABLET | Freq: Two times a day (BID) | ORAL | Status: DC
  Administered 2014-12-09 – 2014-12-11 (×4): 5 mg via ORAL
  Filled 2014-12-09 (×6): qty 1

## 2014-12-09 MED ORDER — DIPHENHYDRAMINE HCL 50 MG/ML IJ SOLN
50.0000 mg | Freq: Once | INTRAMUSCULAR | Status: AC
  Administered 2014-12-09: 50 mg via INTRAMUSCULAR
  Filled 2014-12-09: qty 1

## 2014-12-09 MED ORDER — LORAZEPAM 2 MG/ML IJ SOLN
2.0000 mg | Freq: Once | INTRAMUSCULAR | Status: AC
  Administered 2014-12-09: 2 mg via INTRAMUSCULAR
  Filled 2014-12-09: qty 1

## 2014-12-09 MED ORDER — POTASSIUM CHLORIDE CRYS ER 10 MEQ PO TBCR
10.0000 meq | EXTENDED_RELEASE_TABLET | Freq: Every day | ORAL | Status: DC
  Administered 2014-12-09 – 2014-12-11 (×3): 10 meq via ORAL
  Filled 2014-12-09 (×3): qty 1

## 2014-12-09 MED ORDER — IBUPROFEN 200 MG PO TABS: 600.0000 mg | ORAL_TABLET | Freq: Three times a day (TID) | ORAL | Status: DC | PRN

## 2014-12-09 MED ORDER — ALUM & MAG HYDROXIDE-SIMETH 200-200-20 MG/5ML PO SUSP
30.0000 mL | ORAL | Status: DC | PRN
Start: 2014-12-09 — End: 2014-12-11

## 2014-12-09 MED ORDER — ZIPRASIDONE MESYLATE 20 MG IM SOLR
20.0000 mg | Freq: Once | INTRAMUSCULAR | Status: AC
  Administered 2014-12-09: 20 mg via INTRAMUSCULAR
  Filled 2014-12-09: qty 20

## 2014-12-09 NOTE — Progress Notes (Signed)
Disposition CSW completed referrals to the following inpatient psych placements:  [facility]   CSW will continue to assist with patient's disposition needs.  Seward Spe[facility] Disposition CSW [phone]

## 2014-12-09 NOTE — ED Notes (Signed)
Report received from Lizbeth Bark RN. Pt. Sleeping, respirations regular. Will continue to monitor for safety via security cameras and Q 15 minute checks.

## 2014-12-09 NOTE — ED Notes (Signed)
Pt. Noted sleeping in room. No complaints or concerns voiced. No distress or abnormal behavior noted. Will continue to monitor with security cameras. Q 15 minute rounds continue. 

## 2014-12-09 NOTE — ED Notes (Signed)
Pt. Noted in room. No complaints or concerns voiced. No distress or abnormal behavior noted. Will continue to monitor with security cameras. Q 15 minute rounds continue. 

## 2014-12-09 NOTE — ED Notes (Signed)
Pt. Awakened for his medications. Pts. Bed changed and assisted to change his scrubs after washing areas exposed to urine.

## 2014-12-09 NOTE — ED Notes (Signed)
Pt up in room, agreed to take ativan, pt pacing, staring at the ceiling, and declines avh, pt also asking  For a shot.  Catha Nottingham DNP updated.

## 2014-12-09 NOTE — ED Notes (Signed)
Pt up in the hall talking loudly, unable to understand what he is saying, redirected back to his room.

## 2014-12-09 NOTE — ED Notes (Signed)
Pt up in hall walked to door.  Pt redirected back to  His room w/o difficulty.  Pt appears more anxious/apprehensive and agreed to take PO ativan.

## 2014-12-09 NOTE — ED Notes (Signed)
Pt not able to urinate at this time
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
## 2014-12-09 NOTE — BH Assessment (Signed)
Assessment completed. Consulted Ijeoma Nwaeze, NP who recommended inpatient treatment. Dr. Otter has been informed of the recommendation.  

## 2014-12-09 NOTE — ED Notes (Signed)
Pt. Up to shower after urinating in his pants. Pt. Declined medications for anxiety and insomnia.
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
## 2014-12-09 NOTE — ED Notes (Signed)
Pt. Ran out of room and toward double doors. Pt. Forcefully pushed on door and then sat on the floor next to door and leaned against wall. Pt. Stated he had a"bad dream" and "got scared". Pt. Denies injury.
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
## 2014-12-09 NOTE — ED Notes (Signed)
After taking the ativan, pt came out of the room running and charging at the doors. Security/[employer] present.  Additional meds as ordered.

## 2014-12-09 NOTE — ED Notes (Signed)
Up to the bathroom 

## 2014-12-09 NOTE — ED Notes (Signed)
Pt. To [facility] from ED ambulatory without difficulty, to room 35 . Report from [facility]. Pt. Is alert, warm and dry in no distress. Pt. Denies SI, HI, and AVH. Pt. Calm and cooperative. Pt. Made aware of security cameras and Q15 minute rounds. Pt. Encouraged to let Nursing staff know of any concerns or needs.

## 2014-12-10 DIAGNOSIS — R45851 Suicidal ideations: Secondary | ICD-10-CM | POA: Diagnosis not present

## 2014-12-10 DIAGNOSIS — F259 Schizoaffective disorder, unspecified: Secondary | ICD-10-CM | POA: Diagnosis not present

## 2014-12-10 DIAGNOSIS — F29 Unspecified psychosis not due to a substance or known physiological condition: Secondary | ICD-10-CM | POA: Diagnosis not present

## 2014-12-10 MED ORDER — NICOTINE 7 MG/24HR TD PT24
7.0000 mg | MEDICATED_PATCH | Freq: Every day | TRANSDERMAL | Status: DC
  Administered 2014-12-10 – 2014-12-11 (×2): 7 mg via TRANSDERMAL
  Filled 2014-12-10 (×2): qty 1

## 2014-12-10 NOTE — ED Notes (Signed)
Pt. Noted sleeping in room. No complaints or concerns voiced. No distress or abnormal behavior noted. Will continue to monitor with security cameras. Q 15 minute rounds continue. 

## 2014-12-10 NOTE — ED Notes (Signed)
Pt  Up in room smiling, pleasant.  Pt report that he slept well last night and is feeling much better.

## 2014-12-10 NOTE — ED Notes (Signed)
Up to the bathroom 

## 2014-12-10 NOTE — ED Notes (Signed)
Pt. Noted in room occasionally talking to unseen individuals and yelling out. No complaints or concerns voiced. No acute distress noted. Will continue to monitor with security cameras. Q 15 minute rounds continue.

## 2014-12-10 NOTE — ED Notes (Signed)
Pt up in hall, standing clentching his fists appears apprehensive, redirected, medicated

## 2014-12-10 NOTE — ED Notes (Signed)
Brandon [name] at [facility] notified that the pt will not transport until approx 0800AM

## 2014-12-10 NOTE — ED Notes (Signed)
Sheriff called and will not transport untill the AM-approx 0800

## 2014-12-10 NOTE — ED Notes (Signed)
In room eating snack

## 2014-12-10 NOTE — ED Notes (Signed)
Report received from Janie Rambo RN. Pt. Alert in no distress denies SI, HI, AVH and pain.  Pt. Instructed to come to me with problems or concerns.Will continue to monitor for safety via security cameras and Q 15 minute checks. 

## 2014-12-10 NOTE — Progress Notes (Addendum)
12:06pm. CSW faxed paperwork to [facility], so that they can consider him for inpatient.  York Spaniel LCSWA Clinical Social Worker [facility] phone: [phone]   5:28PM. CSW confirmed from [facility] at [place] that they have all the information needed to consider patient for admission, pending Attending Psych approval.  Seward Speck [facility] Disposition CSW [phone]

## 2014-12-10 NOTE — ED Notes (Signed)
Dr A and Jamison DNP into see 

## 2014-12-10 NOTE — BHH Counselor (Signed)
LCSW notified the patients guardian Frizell,[name] at  [phone] to inform her that the patient has been accepted to [facility]) and would be transported by [facility]. LCSW provided the patients guardian with the phone number and address to the hospital for her convenience.   Per RN, patient will be transported until tomorrow and arrive around lunch time. LCSW contacted the patients guardian to relay this information at 6:31PM.

## 2014-12-10 NOTE — BHH Counselor (Signed)
Patient has been accepted to [facility] per [facility].  Dr. Dortha Schwalbe attending.     Contact [phone] for Nurse report at about 6:00PM.

## 2014-12-10 NOTE — ED Notes (Signed)
Pt. Pacing around unit trying the mag lock doors intermittently. Pt. Walking onto opposite side of unit. Pt. Redirectable to his side.

## 2014-12-10 NOTE — Consult Note (Signed)
[name] Consult   Reason for Consult:  Psychosis Referring Physician:  EDP Patient Identification: Brandon [name] MRN:  [id number] Principal Diagnosis: Schizoaffective disorder, unspecified type Diagnosis:   Patient Active Problem List   Diagnosis Date Noted  . Hallucinations [R44.3] 08/05/2014    Priority: High  . Homicidal ideation [R45.850]     Priority: High  . Schizoaffective disorder, unspecified type [F25.9]     Priority: High  . Auditory hallucination [R44.0]   . Left-sided weakness [M62.89] 01/18/2014  . Multiple sclerosis [G35] 05/20/2013  . White matter abnormality on MRI of brain [R93.0] 11/23/2012  . Abnormality of gait [R26.9] 11/23/2012  . HTN (hypertension) [I10] 07/04/2011  . Ataxia [R27.0] 07/02/2011  . OBESITY [E66.9] 01/14/2010  . ADHD [F90.9] 01/14/2010    Total Time spent with patient: 30 minutes  Subjective:   Brandon [name] is a 24 y.o. male patient admitted with psychosis.  HPI:  The patient reports feeling better today and getting a good night's sleep.  He states he was cured of his "schizophrenia" but does admit to depression and anxiety.  Brandon [name] denies hallucinations but obviously responding to internal stimuli.  He was talking to people that were not there on assessment and abruptly asked this provider to "Please don't spit in my face."  After assessment, his anxiety and restlessness increased and PRN medications needed.  He is agreeable to medications and cooperative. HPI Elements:   Location:  generalized. Quality:  acute. Severity:  severe. Timing:  constant. Duration:  5 days. Context:  stressors.  Past Medical History:  Past Medical History  Diagnosis Date  . ADHD (attention deficit hyperactivity disorder)   . Hypertension   . Schizophrenia   . Bipolar 1 disorder   . 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 Obesity   . Multiple sclerosis 05/20/2013    Past Surgical History  Procedure  Laterality Date  . None     Family History:  Family History  Problem Relation Age of Onset  . Diabetes Mother   . ADD / ADHD Brother    Social History:  History  Alcohol Use  . 0.0 oz/week  . 0 Standard drinks or equivalent per week    Comment: sometimes     History  Drug Use  . Yes  . Special: Marijuana    Comment: Pt denies    History   Social History  . Marital Status: Single    Spouse Name: N/A  . Number of Children: 0  . Years of Education: 11th   Occupational History  .      Disbaled   Social History Main Topics  . Smoking status: Current Every Day Smoker -- 0.25 packs/day    Types: Cigarettes  . Smokeless tobacco: Never Used  . Alcohol Use: 0.0 oz/week    0 Standard drinks or equivalent per week     Comment: sometimes  . Drug Use: Yes    Special: Marijuana     Comment: Pt denies  . Sexual Activity: Not on file   Other Topics Concern  . None   Social History Narrative   Patient lives at home with his mother.   Disabled.   Education 11 th grade .   Right handed.   Caffeine - one cup daily soda.   Additional Social History:    History of alcohol / drug use?: Yes Name of Substance 1: Alcohol  1 - Age of First Use: 19  1 - Amount (  size/oz): 40 oz 1 - Frequency: unknown  1 - Duration: unknown  1 - Last Use / Amount: 12-08-14 Name of Substance 2: THC  2 - Age of First Use: 18 2 - Amount (size/oz): unknown  2 - Frequency: unknown  2 - Duration: unknown  2 - Last Use / Amount: unknown                  Allergies:  No Known Allergies  Labs:  Results for orders placed or performed during the hospital encounter of 12/08/14 (from the past 48 hour(s))  Urine Drug Screen     Status: Abnormal   Collection Time: 12/08/14 11:56 PM  Result Value Ref Range   Opiates NONE DETECTED NONE DETECTED   Cocaine NONE DETECTED NONE DETECTED   Benzodiazepines NONE DETECTED NONE DETECTED   Amphetamines NONE DETECTED NONE DETECTED   Tetrahydrocannabinol  POSITIVE (A) NONE DETECTED   Barbiturates NONE DETECTED NONE DETECTED    Comment:        DRUG SCREEN FOR MEDICAL PURPOSES ONLY.  IF CONFIRMATION IS NEEDED FOR ANY PURPOSE, NOTIFY LAB WITHIN 5 DAYS.        LOWEST DETECTABLE LIMITS FOR URINE DRUG SCREEN Drug Class       Cutoff (ng/mL) Amphetamine      1000 Barbiturate      200 Benzodiazepine   494 Tricyclics       496 Opiates          300 Cocaine          300 THC              50   Acetaminophen level     Status: Abnormal   Collection Time: 12/09/14 12:03 AM  Result Value Ref Range   Acetaminophen (Tylenol), Serum <10 (L) 10 - 30 ug/mL    Comment:        THERAPEUTIC CONCENTRATIONS VARY SIGNIFICANTLY. A RANGE OF 10-30 ug/mL MAY BE AN EFFECTIVE CONCENTRATION FOR MANY PATIENTS. HOWEVER, SOME ARE BEST TREATED AT CONCENTRATIONS OUTSIDE THIS RANGE. ACETAMINOPHEN CONCENTRATIONS >150 ug/mL AT 4 HOURS AFTER INGESTION AND >50 ug/mL AT 12 HOURS AFTER INGESTION ARE OFTEN ASSOCIATED WITH TOXIC REACTIONS.   CBC     Status: Abnormal   Collection Time: 12/09/14 12:03 AM  Result Value Ref Range   WBC 10.9 (H) 4.0 - 10.5 K/uL   RBC 4.95 4.22 - 5.81 MIL/uL   Hemoglobin 12.9 (L) 13.0 - 17.0 g/dL   HCT 40.2 39.0 - 52.0 %   MCV 81.2 78.0 - 100.0 fL   MCH 26.1 26.0 - 34.0 pg   MCHC 32.1 30.0 - 36.0 g/dL   RDW 15.8 (H) 11.5 - 15.5 %   Platelets 194 150 - 400 K/uL  Comprehensive metabolic panel     Status: Abnormal   Collection Time: 12/09/14 12:03 AM  Result Value Ref Range   Sodium 141 135 - 145 mmol/L   Potassium 3.0 (L) 3.5 - 5.1 mmol/L   Chloride 107 101 - 111 mmol/L   CO2 25 22 - 32 mmol/L   Glucose, Bld 118 (H) 65 - 99 mg/dL   BUN 8 6 - 20 mg/dL   Creatinine, Ser 0.89 0.61 - 1.24 mg/dL   Calcium 9.4 8.9 - 10.3 mg/dL   Total Protein 7.0 6.5 - 8.1 g/dL   Albumin 4.1 3.5 - 5.0 g/dL   AST 17 15 - 41 U/L   ALT 29 17 - 63 U/L   Alkaline Phosphatase 78 38 -  126 U/L   Total Bilirubin 0.9 0.3 - 1.2 mg/dL   GFR calc non Af Amer  >60 >60 mL/min   GFR calc Af Amer >60 >60 mL/min    Comment: (NOTE) The eGFR has been calculated using the CKD EPI equation. This calculation has not been validated in all clinical situations. eGFR's persistently <60 mL/min signify possible Chronic Kidney Disease.    Anion gap 9 5 - 15  Ethanol (ETOH)     Status: None   Collection Time: 12/09/14 12:03 AM  Result Value Ref Range   Alcohol, Ethyl (B) <5 <5 mg/dL    Comment:        LOWEST DETECTABLE LIMIT FOR SERUM ALCOHOL IS 11 mg/dL FOR MEDICAL PURPOSES ONLY   Salicylate level     Status: None   Collection Time: 12/09/14 12:03 AM  Result Value Ref Range   Salicylate Lvl <8.2 2.8 - 30.0 mg/dL    Vitals: Blood pressure 141/62, pulse 97, temperature 97.8 F (36.6 C), temperature source Oral, resp. rate 16, SpO2 100 %.  Risk to Self: Suicidal Ideation: No Suicidal Intent: No Is patient at risk for suicide?: No Suicidal Plan?: No Access to Means: No What has been your use of drugs/alcohol within the last 12 months?: Pt reported that he smokes THC and drinks alcohol.  How many times?: 0 Other Self Harm Risks: Walking into traffic Triggers for Past Attempts: None known Intentional Self Injurious Behavior: None Risk to Others: Homicidal Ideation: No Thoughts of Harm to Others: No Current Homicidal Intent: No Current Homicidal Plan: No Access to Homicidal Means: No Identified Victim: NA History of harm to others?: No Assessment of Violence: None Noted Violent Behavior Description: No violent behaviors observed at this time.  Does patient have access to weapons?: No Criminal Charges Pending?: No Does patient have a court date: No Prior Inpatient Therapy: Prior Inpatient Therapy: Yes Prior Therapy Dates: Unknown  Prior Therapy Facilty/Provider(s): Unknown  Reason for Treatment: Schizoaffective  Prior Outpatient Therapy: Prior Outpatient Therapy: Yes Prior Therapy Dates: Current  Prior Therapy Facilty/Provider(s):  [facility] Reason for Treatment: Schizoaffective  Does patient have an ACCT team?: Unknown Does patient have Intensive In-House Services?  : No Does patient have [facility] services? : No Does patient have P4[facility] services?: No  Current Facility-Administered Medications  Medication Dose Route Frequency Provider Last Rate Last Dose  . acetaminophen (TYLENOL) tablet 650 mg  650 mg Oral Q4H PRN Linton Flemings, MD      . alum & mag hydroxide-simeth (MAALOX/MYLANTA) 200-200-20 MG/5ML suspension 30 mL  30 mL Oral PRN Linton Flemings, MD      . ARIPiprazole (ABILIFY) tablet 5 mg  5 mg Oral BID PC Laszlo Ellerby   5 mg at 12/10/14 0941  . benztropine (COGENTIN) tablet 0.5 mg  0.5 mg Oral BID Adaiah Jaskot   0.5 mg at 12/10/14 0940  . ibuprofen (ADVIL,MOTRIN) tablet 600 mg  600 mg Oral Q8H PRN Linton Flemings, MD      . lithium carbonate (ESKALITH) CR tablet 450 mg  450 mg Oral Q12H Shelsy Seng   450 mg at 12/10/14 0941  . LORazepam (ATIVAN) tablet 1 mg  1 mg Oral Q6H PRN Patrecia Pour, NP   1 mg at 12/10/14 1121  . nicotine (NICODERM CQ - dosed in mg/24 hr) patch 7 mg  7 mg Transdermal Daily Patrecia Pour, NP   7 mg at 12/10/14 1222  . ondansetron (ZOFRAN) tablet 4 mg  4 mg Oral Q8H  PRN Linton Flemings, MD      . potassium chloride (K-DUR,KLOR-CON) CR tablet 10 mEq  10 mEq Oral Daily Patrecia Pour, NP   10 mEq at 12/10/14 0941  . traZODone (DESYREL) tablet 100 mg  100 mg Oral QHS PRN Rayen Palen       Current Outpatient Prescriptions  Medication Sig Dispense Refill  . amLODipine (NORVASC) 10 MG tablet Take 10 mg by mouth daily.     . ARIPiprazole 400 MG SUSR Inject 400 mg into the muscle every 28 (twenty-eight) days.    . benztropine (COGENTIN) 1 MG tablet Take 1 mg by mouth.    Marland Kitchen buPROPion (WELLBUTRIN XL) 150 MG 24 hr tablet Take 300 mg by mouth.    . cloNIDine (CATAPRES) 0.1 MG tablet Take 0.1 mg by mouth daily.    Marland Kitchen lithium carbonate (ESKALITH) 450 MG CR tablet Take 900 mg by mouth daily.     . traZODone (DESYREL) 100 MG tablet Take 100 mg by mouth at bedtime.     . cyclobenzaprine (FLEXERIL) 10 MG tablet Take 1 tablet (10 mg total) by mouth 2 (two) times daily as needed for muscle spasms. (Patient not taking: Reported on 10/29/2014) 7 tablet 0    Musculoskeletal: Strength & Muscle Tone: within normal limits Gait & Station: normal Patient leans: N/A  Psychiatric Specialty Exam: Physical Exam  Review of Systems  Constitutional: Negative.   HENT: Negative.   Eyes: Negative.   Respiratory: Negative.   Cardiovascular: Negative.   Gastrointestinal: Negative.   Genitourinary: Negative.   Musculoskeletal: Negative.   Skin: Negative.   Neurological: Negative.   Endo/Heme/Allergies: Negative.   Psychiatric/Behavioral: Positive for hallucinations. The patient is nervous/anxious.     Blood pressure 141/62, pulse 97, temperature 97.8 F (36.6 C), temperature source Oral, resp. rate 16, SpO2 100 %.There is no weight on file to calculate BMI.  General Appearance: Disheveled  Eye Sport and exercise psychologist::  Fair  Speech:  Normal Rate  Volume:  Normal  Mood:  Depressed  Affect:  Flat  Thought Process:  thought blocking  Orientation:  Full (Time, Place, and Person)  Thought Content:  Delusions, auditory and visual hallucinations  Suicidal Thoughts:  Yes.  with intent/plan  Homicidal Thoughts:  No  Memory:  Immediate;   Fair Recent;   Fair Remote;   Fair  Judgement:  Impaired  Insight:  Fair  Psychomotor Activity:  Decreased  Concentration:  Fair  Recall:  [employer] of Knowledge:Fair  Language: Fair  Akathisia:  No  Handed:  Right  AIMS (if indicated):     Assets:  Housing Leisure Time Physical Health Resilience Social Support  ADL's:  Intact  Cognition: WNL  Sleep:      Medical Decision Making: Review of Psycho-Social Stressors (1), Review or order clinical lab tests (1) and Review of Medication Regimen & Side Effects (2)  Treatment Plan Summary: Daily contact with  patient to assess and evaluate symptoms and progress in treatment, Medication management and Plan admit to inpatient psychiatry for stabilization  Plan:  Recommend psychiatric Inpatient admission when medically cleared. Disposition: Johny Sax, Reubens 12/10/2014 12:56 PM Patient seen face-to-face for psychiatric evaluation, chart reviewed and case discussed with the physician extender and developed treatment plan. Reviewed the information documented and agree with the treatment plan. Corena Pilgrim, MD

## 2014-12-10 NOTE — ED Notes (Signed)
Pt. Noted in room on stretcher. No complaints or concerns voiced. No distress or abnormal behavior noted. Will continue to monitor with security cameras. Q 15 minute rounds continue.

## 2014-12-10 NOTE — ED Notes (Signed)
Pt up in the hall walking fast appeared to be going to charge the door.  Pt able to be redirected and stopped.

## 2014-12-10 NOTE — ED Notes (Signed)
Pt. Ran to mag lock doors and tried to open same. Pt. Stated he was ready to "go". Pt. Redirected to his room with assurances that he is safe and that this nurse will be here all night if he has concerns.

## 2014-12-10 NOTE — ED Notes (Signed)
In the shower.  Pt incont. Of urine lenins changed

## 2014-12-10 NOTE — ED Notes (Signed)
In the bathroom

## 2014-12-11 DIAGNOSIS — F29 Unspecified psychosis not due to a substance or known physiological condition: Secondary | ICD-10-CM | POA: Diagnosis not present

## 2014-12-11 NOTE — ED Notes (Signed)
Pt. Noted sleeping in room. No complaints or concerns voiced. No distress or abnormal behavior noted. Will continue to monitor with security cameras. Q 15 minute rounds continue. 

## 2014-12-11 NOTE — [facility] Counselor (Signed)
Writer contacted patient's mother Sandeep, [place] [phone] or [phone] to inform her of patient's disposition.

## 2014-12-11 NOTE — ED Notes (Signed)
Report given to [facility] at [facility] scheduled meds early for transfer of pt.  Pt. Noted responding to internal stimuli .  Also pt began to expose himself.  Pt. Easily redirected and took medication.

## 2014-12-11 NOTE — ED Notes (Signed)
Difficult to assess pt this AM.  Pt states he is tired and not talkative.  No obvious AVH observed.  No bizarre behavior noted at this time

## 2014-12-14 ENCOUNTER — Encounter (HOSPITAL_COMMUNITY): Admission: RE | Admit: 2014-12-14 | Payer: Medicare Other | Source: Ambulatory Visit

## 2014-12-14 NOTE — Telephone Encounter (Signed)
Error

## 2014-12-19 ENCOUNTER — Ambulatory Visit: Payer: Medicare Other | Admitting: Neurology

## 2014-12-27 ENCOUNTER — Telehealth: Payer: Self-pay | Admitting: Neurology

## 2014-12-27 NOTE — Telephone Encounter (Signed)
I called Dr. Clydell Hakim. The patient has been admitted in [place], [place]. He has had an exacerbation of his schizophrenia. I do not believe this is related directly to the MS, but he has missed his Overdose on June 9, and He Is Not Expected to Be Discharged from the [facility] Anytime Soon. I Indicated That If He Misses a Second Dose, He Could Be at Significant Risk for an Exacerbation from His Multiple Sclerosis. As soon as he is discharged, he should get his Tysabri dose as it is possible. Tysabri is not covered as an inpatient.

## 2015-01-11 ENCOUNTER — Encounter (HOSPITAL_COMMUNITY): Payer: Self-pay

## 2015-01-11 ENCOUNTER — Encounter (HOSPITAL_COMMUNITY)
Admission: RE | Admit: 2015-01-11 | Discharge: 2015-01-11 | Disposition: A | Discharge status: Home / Self Care | Payer: Medicare Other | Source: Ambulatory Visit | Attending: Neurology | Admitting: Neurology

## 2015-01-11 VITALS — BP 129/76 | HR 75 | Temp 97.4°F | Resp 18

## 2015-01-11 DIAGNOSIS — G35 Multiple sclerosis: Secondary | ICD-10-CM | POA: Diagnosis not present

## 2015-01-11 MED ORDER — ACETAMINOPHEN 325 MG PO TABS
650.0000 mg | ORAL_TABLET | ORAL | Status: AC
  Administered 2015-01-11: 650 mg via ORAL
  Filled 2015-01-11: qty 2

## 2015-01-11 MED ORDER — SODIUM CHLORIDE 0.9 % IV SOLN
250.0000 mL | INTRAVENOUS | Status: DC
  Administered 2015-01-11: 250 mL via INTRAVENOUS

## 2015-01-11 MED ORDER — LORATADINE 10 MG PO TABS
10.0000 mg | ORAL_TABLET | ORAL | Status: AC
  Administered 2015-01-11: 10 mg via ORAL
  Filled 2015-01-11: qty 1

## 2015-01-11 MED ORDER — SODIUM CHLORIDE 0.9 % IV SOLN
300.0000 mg | INTRAVENOUS | Status: DC
  Administered 2015-01-11: 300 mg via INTRAVENOUS
  Filled 2015-01-11: qty 15

## 2015-01-11 NOTE — Progress Notes (Signed)
Pt received his Tysabri infusion today 01/11/15.  Iv team had to start his iv today. Mother states he was d/c from [facility] on January 04, 2015.  Pt talked to voices today thoughout his infusion.  Pt also soiled himself with urine and stool during his infusion.  Mother was at bedside.  It was unclear if pt had incontinence or he just didn't make it to the restroom in time.  Pt had a restroom in his room and pt usually just takes his iv pump with him to the restroom at previous infusions.  We gave pt some disposable scrubs to change into because he soiled his clothing.  After the restroom episode, pt was very sleepy and hard to arouse.  Mother states he didn't go to bed until 2am today because he was up talking to the voices.  Mother states he is probably still exhausted from being up late and the episode of stooling and urinating on himself and getting cleaned up just took a lot of energy from him.  At D/c mother was able to wake him up enough and pt came running down the hall to leave.  We calmed pt and got a wheelchair for him and took him out side to meet his transportation.

## 2015-01-18 ENCOUNTER — Emergency Department (HOSPITAL_COMMUNITY)
Admission: EM | Admit: 2015-01-18 | Discharge: 2015-01-18 | Discharge status: Against Medical Advice | Payer: Medicare Other | Attending: Emergency Medicine | Admitting: Emergency Medicine

## 2015-01-18 ENCOUNTER — Encounter (HOSPITAL_COMMUNITY): Payer: Self-pay

## 2015-01-18 DIAGNOSIS — R369 Urethral discharge, unspecified: Secondary | ICD-10-CM | POA: Diagnosis present

## 2015-01-18 DIAGNOSIS — I10 Essential (primary) hypertension: Secondary | ICD-10-CM | POA: Insufficient documentation

## 2015-01-18 DIAGNOSIS — Z720 Tobacco use: Secondary | ICD-10-CM | POA: Diagnosis not present

## 2015-01-18 DIAGNOSIS — E669 Obesity, unspecified: Secondary | ICD-10-CM | POA: Diagnosis not present

## 2015-01-18 NOTE — ED Notes (Addendum)
Pt presents with 2 week h/o "boil" to penis, reports clear drainage from penis that just began;  Pt denies any dysuria.  Admits to unprotected sex but unsure if partner is symptomatic.pt denies any abdominal pain, denies any testicular pain or swelling.

## 2015-01-18 NOTE — ED Notes (Addendum)
Pt called to go back to fast track and unable to find pt.

## 2015-01-18 NOTE — ED Notes (Signed)
Called pt multiple times with no answer.  

## 2015-01-31 ENCOUNTER — Ambulatory Visit (INDEPENDENT_AMBULATORY_CARE_PROVIDER_SITE_OTHER): Payer: Medicare Other | Admitting: Neurology

## 2015-01-31 ENCOUNTER — Encounter: Payer: Self-pay | Admitting: Neurology

## 2015-01-31 VITALS — BP 112/72 | HR 56 | Ht 73.0 in | Wt 295.8 lb

## 2015-01-31 DIAGNOSIS — R930 Abnormal findings on diagnostic imaging of skull and head, not elsewhere classified: Secondary | ICD-10-CM

## 2015-01-31 DIAGNOSIS — Z5181 Encounter for therapeutic drug level monitoring: Secondary | ICD-10-CM | POA: Diagnosis not present

## 2015-01-31 DIAGNOSIS — G35 Multiple sclerosis: Secondary | ICD-10-CM

## 2015-01-31 DIAGNOSIS — R9082 White matter disease, unspecified: Secondary | ICD-10-CM

## 2015-01-31 DIAGNOSIS — R269 Unspecified abnormalities of gait and mobility: Secondary | ICD-10-CM

## 2015-01-31 NOTE — Progress Notes (Addendum)
Reason for visit: Multiple sclerosis  Brandon [name] is an 24 y.o. male  History of present illness:  [name] is a 24 year old right-handed black male with a history of schizophrenia. He lives at home with his parents. He has severe aggressive multiple sclerosis, he has been treated with Tysabri. At one point, the patient was not ambulatory, with a severe left hemiparesis. He made a remarkable recovery with physical therapy. Unfortunately, he has been medically noncompliant. He got his Tysabri in November 2015, he missed doses in December, January, February, and March. He got his Tysabri in April 2016, but he missed doses in May and June 2016. He did get a dose of Tysabri on 01/11/2015. Shortly after this infusion, the patient began having some difficulty with standing up, he has had some alteration in his balance, slight worsening of weakness on the left side. He has chronic issues with dysarthria, this has not changed. He denies any dysphagia. He has had some nocturia over the last 3-4 weeks. He denies any falls. He has had an admission on 12/09/2014 to [facility]. The patient has had some legal issues, requiring court dates. His psychiatric illness has not been stable. He returns to the office today for an evaluation. JC viral antibody titers previously were negative. The last screen was on 09/13/2014.  Past Medical History  Diagnosis Date  . ADHD (attention deficit hyperactivity disorder)   . Hypertension   . Schizophrenia   . Bipolar 1 disorder   . 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 Obesity   . Multiple sclerosis 05/20/2013    Past Surgical History  Procedure Laterality Date  . None      Family History  Problem Relation Age of Onset  . Diabetes Mother   . ADD / ADHD Brother     Social history:  reports that he has been smoking Cigarettes.  He has been smoking about 0.25 packs per day. He has never used smokeless tobacco. He reports that he does  not drink alcohol or use illicit drugs.   No Known Allergies  Medications:  Prior to Admission medications   Medication Sig Start Date End Date Taking? Authorizing Provider  amLODipine (NORVASC) 10 MG tablet Take 10 mg by mouth daily.  11/30/14  Yes Historical Provider, MD  ARIPiprazole 400 MG SUSR Inject 400 mg into the muscle every 28 (twenty-eight) days.   Yes Historical Provider, MD  benztropine (COGENTIN) 1 MG tablet Take 1 mg by mouth. 08/17/14  Yes Historical Provider, MD  DOCUSATE SODIUM PO Take 100 mg by mouth.   Yes Historical Provider, MD  fluPHENAZine (PROLIXIN) 10 MG tablet Take 10 mg by mouth at bedtime.   Yes Historical Provider, MD  folic acid (FOLVITE) 1 MG tablet Take 1 mg by mouth daily.   Yes Historical Provider, MD  lithium 300 MG tablet Take 300 mg by mouth at bedtime. Take 2 capsules   Yes Historical Provider, MD  metFORMIN (GLUCOPHAGE) 500 MG tablet Take by mouth 2 (two) times daily with a meal.   Yes Historical Provider, MD  traZODone (DESYREL) 100 MG tablet Take 150 mg by mouth at bedtime.  12/05/14  Yes Historical Provider, MD  Vitamin D, Ergocalciferol, (DRISDOL) 50000 UNITS CAPS capsule Take 50,000 Units by mouth every 7 (seven) days.   Yes Historical Provider, MD    ROS:  Out of a complete 14 system review of symptoms, the patient complains only of the following symptoms, and all  other reviewed systems are negative.  Drooling Excessive eating Constipation Insomnia, frequent waking, snoring, sleep talking Incontinence of the bladder, frequency of urination Joint pain, joint swelling, back pain, walking difficulty Speech difficulty Agitation, behavior problems, self injury  Blood pressure 112/72, pulse 56, height 6\' 1"  (1.854 m), weight 295 lb 12.8 oz (134.174 kg).  Physical Exam  General: The patient is alert and cooperative at the time of the examination. The patient is markedly obese.  Skin: No significant peripheral edema is noted.   Neurologic  Exam  Mental status: The patient is alert, will cooperate with the examination. The patient appears to be cognitively slow.   Cranial nerves: Facial symmetry is present. Speech is dysarthric. Extraocular movements are full. Visual fields are full. Pupils are equal, round, and reactive to light. Discs are flat bilaterally.  Motor: The patient has good strength in all 4 extremities.  Sensory examination: Soft touch sensation is symmetric on the face, arms, and legs.  Coordination: The patient has good finger-nose-finger and heel-to-shin bilaterally.  Gait and station: The patient has a slightly wide-based gait. The patient is able to perform tandem gait. Romberg is negative.  Reflexes: Deep tendon reflexes are symmetric.   Assessment/Plan:  1. Multiple sclerosis  2. Schizophrenia  3. Medical noncompliance  The patient is not compliant with his Tysabri treatments. The patient lives with his parents, I have indicated that it is very important for him to continue to get his Tysabri treatments on a monthly basis, and that the patient has a very aggressive form of multiple sclerosis that may be fatal without adequate treatment. The father at that point got up and left the room. The patient himself is not competent to maintain a proper schedule to get his Tysabri treatments. The parents are responsible for his medical care. The patient will have blood work done today to include a JC virus antibody level. The patient will have MRI evaluation of the brain with and without gadolinium enhancement given the new symptoms. He will follow-up in 4 months.  Marlan Palau MD 02/01/2015 7:54 AM  [facility] [address] [place], [place] [address]  Phone [phone] Fax [phone]

## 2015-01-31 NOTE — Patient Instructions (Signed)

## 2015-02-01 ENCOUNTER — Telehealth: Payer: Self-pay | Admitting: *Deleted

## 2015-02-01 LAB — CBC WITH DIFFERENTIAL/PLATELET
Basophils Absolute: 0 10*3/uL (ref 0.0–0.2)
Basos: 0 %
EOS (ABSOLUTE): 0.4 10*3/uL (ref 0.0–0.4)
Eos: 5 %
Hematocrit: 43.1 % (ref 37.5–51.0)
Hemoglobin: 13.2 g/dL (ref 12.6–17.7)
Immature Grans (Abs): 0 10*3/uL (ref 0.0–0.1)
Immature Granulocytes: 0 %
Lymphocytes Absolute: 3.1 10*3/uL (ref 0.7–3.1)
Lymphs: 36 %
MCH: 25.1 pg — ABNORMAL LOW (ref 26.6–33.0)
MCHC: 30.6 g/dL — ABNORMAL LOW (ref 31.5–35.7)
MCV: 82 fL (ref 79–97)
Monocytes Absolute: 0.5 10*3/uL (ref 0.1–0.9)
Monocytes: 6 %
Neutrophils Absolute: 4.6 10*3/uL (ref 1.4–7.0)
Neutrophils: 53 %
Platelets: 180 10*3/uL (ref 150–379)
RBC: 5.26 x10E6/uL (ref 4.14–5.80)
RDW: 15 % (ref 12.3–15.4)
WBC: 8.6 10*3/uL (ref 3.4–10.8)

## 2015-02-01 LAB — COMPREHENSIVE METABOLIC PANEL
ALT: 21 IU/L (ref 0–44)
AST: 11 IU/L (ref 0–40)
Albumin/Globulin Ratio: 2.2 (ref 1.1–2.5)
Albumin: 4.4 g/dL (ref 3.5–5.5)
Alkaline Phosphatase: 76 IU/L (ref 39–117)
BUN/Creatinine Ratio: 10 (ref 8–19)
BUN: 10 mg/dL (ref 6–20)
Bilirubin Total: 0.4 mg/dL (ref 0.0–1.2)
CO2: 22 mmol/L (ref 18–29)
Calcium: 9.2 mg/dL (ref 8.7–10.2)
Chloride: 100 mmol/L (ref 97–108)
Creatinine, Ser: 0.97 mg/dL (ref 0.76–1.27)
GFR calc Af Amer: 128 mL/min/{1.73_m2} (ref >59)
GFR calc non Af Amer: 110 mL/min/{1.73_m2} (ref >59)
Globulin, Total: 2 g/dL (ref 1.5–4.5)
Glucose: 76 mg/dL (ref 65–99)
Potassium: 4.2 mmol/L (ref 3.5–5.2)
Sodium: 140 mmol/L (ref 134–144)
Total Protein: 6.4 g/dL (ref 6.0–8.5)

## 2015-02-01 NOTE — Telephone Encounter (Signed)
Mother of pt, stated that pt is scheduled for MRI of brain 02-14-15 at [facility].  Pt needs xanax for MRI.  If ok, will call her to pick up.

## 2015-02-01 NOTE — Progress Notes (Signed)
Quick Note:  I called and gave results to mother that lab work unremarkable. JCV result still pending. She verbalized understanding. ______

## 2015-02-01 NOTE — Telephone Encounter (Signed)
Okay for patient have alprazolam for the MRI.

## 2015-02-02 ENCOUNTER — Other Ambulatory Visit: Payer: Self-pay | Admitting: Neurology

## 2015-02-02 DIAGNOSIS — G35 Multiple sclerosis: Secondary | ICD-10-CM

## 2015-02-05 ENCOUNTER — Telehealth: Payer: Self-pay | Admitting: Neurology

## 2015-02-05 NOTE — Telephone Encounter (Signed)
I called and spoke to mother of pt.   Relayed that a xanax packet will be placed up at check in for pick up (with instructions on how to take).  She verbalized understanding.

## 2015-02-05 NOTE — Telephone Encounter (Signed)
JC virus antibody titer was negative.

## 2015-02-08 ENCOUNTER — Encounter (HOSPITAL_COMMUNITY): Payer: Self-pay

## 2015-02-08 ENCOUNTER — Encounter (HOSPITAL_COMMUNITY)
Admission: RE | Admit: 2015-02-08 | Discharge: 2015-02-08 | Disposition: A | Discharge status: Home / Self Care | Payer: Medicare Other | Source: Ambulatory Visit | Attending: Neurology | Admitting: Neurology

## 2015-02-08 VITALS — BP 123/72 | HR 68 | Temp 97.8°F | Resp 18 | Ht 73.0 in | Wt 293.0 lb

## 2015-02-08 DIAGNOSIS — G35 Multiple sclerosis: Secondary | ICD-10-CM | POA: Diagnosis not present

## 2015-02-08 MED ORDER — SODIUM CHLORIDE 0.9 % IV SOLN
INTRAVENOUS | Status: DC
  Administered 2015-02-08: 250 mL via INTRAVENOUS

## 2015-02-08 MED ORDER — LORATADINE 10 MG PO TABS
10.0000 mg | ORAL_TABLET | ORAL | Status: DC
  Administered 2015-02-08: 10 mg via ORAL
  Filled 2015-02-08: qty 1

## 2015-02-08 MED ORDER — NATALIZUMAB 300 MG/15ML IV CONC
300.0000 mg | INTRAVENOUS | Status: DC
  Administered 2015-02-08: 300 mg via INTRAVENOUS
  Filled 2015-02-08: qty 15

## 2015-02-08 MED ORDER — ACETAMINOPHEN 325 MG PO TABS
650.0000 mg | ORAL_TABLET | ORAL | Status: DC
  Administered 2015-02-08: 650 mg via ORAL
  Filled 2015-02-08: qty 2

## 2015-02-08 NOTE — Progress Notes (Signed)
Pt arrived today for his Tysabri, accompanied by Mom. Pt was ambulatory but slight dragging of left leg. Pt was pleasant and cooperative. Slept for most of his infusionand,observation period .When awakened for VS would answer Mom's questions but also mumbling ,and talking and laughing on occasion then back to sleep. Uneventful infusion of TYSABRI. Mom states he took his AM medication just before arrival to Short Stay and he was more sedated at the end of the infusion. Pt was discharged via w/c accompanied by staff and Mom to go to main entrance exit.She had called transportation and she and Whalen wanted to sit on bench to await transportation home

## 2015-02-13 ENCOUNTER — Telehealth: Payer: Self-pay | Admitting: Neurology

## 2015-02-13 DIAGNOSIS — G35 Multiple sclerosis: Secondary | ICD-10-CM

## 2015-02-13 NOTE — Telephone Encounter (Signed)
I called the mother. The patient is having gradually increasing weakness on the left side. This started 2 weeks ago. I will get home health physical therapy out of the house, get him started on a three-day course of Solu-Medrol. MRI the brain will be done tomorrow. The mother says that he is only missed 2 doses of Tysabri over the last 6 months, not as much as I had thought originally.

## 2015-02-13 NOTE — Telephone Encounter (Signed)
I called the patient's mother. She stated that starting yesterday, the patient has began having weakness on the left side again. He had severe hemiparesis on the left side, but worked with PT and had improved. The patient's mother states that he is having trouble standing again. He also c/o pain in his back. He has fallen several times while trying to get dressed. He is scheduled for a MRI of the brain tomorrow. His mother would like to know if we could also get a MRI of his back.

## 2015-02-13 NOTE — Telephone Encounter (Signed)
Mom would like to speak with nurse regarding patient's back. He is having a lot of trouble with his back. He's having trouble standing. He's having shooting pain in his back. He fell a couple of times yesterday and a couple of times today.

## 2015-02-14 ENCOUNTER — Other Ambulatory Visit: Payer: Self-pay | Admitting: Neurology

## 2015-02-14 ENCOUNTER — Ambulatory Visit
Admission: RE | Admit: 2015-02-14 | Discharge: 2015-02-14 | Disposition: A | Discharge status: Home / Self Care | Payer: Medicare Other | Source: Ambulatory Visit | Attending: Neurology | Admitting: Neurology

## 2015-02-14 DIAGNOSIS — R9082 White matter disease, unspecified: Secondary | ICD-10-CM

## 2015-02-14 DIAGNOSIS — Z5181 Encounter for therapeutic drug level monitoring: Secondary | ICD-10-CM

## 2015-02-14 DIAGNOSIS — G35 Multiple sclerosis: Secondary | ICD-10-CM

## 2015-02-14 DIAGNOSIS — R269 Unspecified abnormalities of gait and mobility: Secondary | ICD-10-CM

## 2015-02-15 ENCOUNTER — Telehealth: Payer: Self-pay | Admitting: Neurology

## 2015-02-15 NOTE — Telephone Encounter (Signed)
I called the mother. The MRI was not done with contrast, they could not get IV access. No definite new lesions noted, nothing that looks like PML. They have not heard from [facility] nursing for the PT and steroids. I will need to find out what is going on with this.   MRI brain results 02/15/15:  IMPRESSION:  Abnormal MRI brain (without) demonstrating: 1. Moderate right greater than left subcortical and periventricular and peri-callosal chronic demyelinating plaques. 2. No acute findings. 3. Compared to MRI on 01/18/14, the acute right frontal lesions now appear chronic. No definite new lesions noted.

## 2015-02-16 NOTE — Telephone Encounter (Signed)
I called Brandon [name] with [facility]. She stated that she would work on sending PT and nursing to the patient's home and should have someone there tomorrow. I called the patient's mother and explained this to her.

## 2015-02-19 ENCOUNTER — Other Ambulatory Visit: Payer: Self-pay | Admitting: Neurology

## 2015-02-19 DIAGNOSIS — G35 Multiple sclerosis: Secondary | ICD-10-CM

## 2015-02-19 NOTE — Telephone Encounter (Signed)
Dr. Anne Hahn approved the solumedrol IM. I called Mary back. Per Corrie Dandy, they need a new order for this. Dr. Anne Hahn will place the order.

## 2015-02-19 NOTE — Telephone Encounter (Signed)
I called Brandon [name]. The home health nurse was not able to start an IV on the patient. Per the patient's mother, it is very difficult to start an IV on him due to his thick skin. The home health nurse would like to know if there is another way to give the patient his steroid, such as IM. I told Brandon [name] I would check with Dr. Anne Hahn and let her know.

## 2015-02-19 NOTE — Telephone Encounter (Signed)
Phone [phone] [facility]

## 2015-02-19 NOTE — Telephone Encounter (Signed)
Brandon [name] with [facility] is calling back, would like to change Solumedrol to IM

## 2015-02-20 NOTE — Telephone Encounter (Signed)
Mom is calling back. States no one has called her regarding order for steroids IM vs IV. Please call mom Runette Hutcherson [phone].

## 2015-02-20 NOTE — Telephone Encounter (Signed)
I called the patient's mother. I explained that Dr. Anne Hahn placed the order late in the day yesterday for IM solumedrol. She should hear from the home health company soon to schedule another visit. She verbalized understanding.

## 2015-02-20 NOTE — Telephone Encounter (Signed)
Order has been placed.

## 2015-02-21 ENCOUNTER — Telehealth: Payer: Self-pay | Admitting: Neurology

## 2015-02-21 ENCOUNTER — Other Ambulatory Visit: Payer: Self-pay | Admitting: Neurology

## 2015-02-21 DIAGNOSIS — R269 Unspecified abnormalities of gait and mobility: Secondary | ICD-10-CM

## 2015-02-21 DIAGNOSIS — G35 Multiple sclerosis: Secondary | ICD-10-CM

## 2015-02-21 NOTE — Telephone Encounter (Signed)
Mom is calling regarding steroid injections. The agency was supposed to come out and do it but no one has called or shown up.

## 2015-02-21 NOTE — Telephone Encounter (Signed)
The order for physical therapy has been placed.

## 2015-02-21 NOTE — Telephone Encounter (Signed)
I called Corrie Dandy to follow up on the order and make sure they had everything they needed to be able to start the IM injections for solumedrol. I also called the patient's mother and explained that Corrie Dandy has all the information she needs and will follow up with the home health nurse who was coming to the home to make sure she puts him back on her schedule.

## 2015-02-21 NOTE — Telephone Encounter (Signed)
Brandon [name] [facility] needs an order for home health pt for strength and balance training, Once a week for 1 week,  Twice a week 4 weeks. [phone] Brandon [name].

## 2015-02-22 NOTE — Telephone Encounter (Signed)
Noma/ Pharmacist with [facility] [employer] [phone] called regarding Rx for Solumedrol 500 mg. She would like to know how it's to be given ie: 500mg  all at once or in divided does ie: 2-250mg  or 3-166mg 

## 2015-02-22 NOTE — Telephone Encounter (Signed)
I called N[place]. Dr. Anne Hahn would like for the 500 mg of Solumedrol to all be given at once. She stated they have to divide it into 2 doses of 250 mg each because it would be too much volume to inject at one time. I told her I would let Dr. Anne Hahn know.

## 2015-02-23 ENCOUNTER — Telehealth: Payer: Self-pay | Admitting: Neurology

## 2015-02-23 DIAGNOSIS — R269 Unspecified abnormalities of gait and mobility: Secondary | ICD-10-CM

## 2015-02-23 DIAGNOSIS — G35 Multiple sclerosis: Secondary | ICD-10-CM

## 2015-02-23 NOTE — Telephone Encounter (Signed)
Pt's mother called and would like to know if an order for a shower bench was put in. She has not heard from anyone regarding it and it has been over a week since it was talked about. Would also like to know if a bench to sit on can be added to his walker that he ( the pt) has now. Please call and advise [phone]

## 2015-02-23 NOTE — Telephone Encounter (Signed)
I called Mary with [facility]. She is not aware of needing an order for a shower chair. She is going to reach out to the PT working with the patient to find out if Dr. Anne Hahn needs to write an order. I have called the patient's mother and relayed this.

## 2015-02-27 NOTE — Telephone Encounter (Signed)
Brandon [name] with [facility] is requesting order for Tub transfer bench and rolllator walker. She can be reached at [phone] and fax# [phone]

## 2015-02-27 NOTE — Telephone Encounter (Signed)
I will write the prescriptions.

## 2015-02-27 NOTE — Telephone Encounter (Signed)
Ginger with [facility] is calling and wants to add a hospital bed and a 3 in one commode to requested order.  Thanks!

## 2015-03-05 NOTE — Telephone Encounter (Signed)
I called, and I talked with the mother. The patient is on a multitude of psychoactive medications, they have talked with the psychiatrist, they have taken him off of the lithium, Cogentin, and Prolixin, plans are to switching to Abilify. He needs a hospital bed, they do not plan on finding the bed, and only ranting. I will [facility] to get [facility] to send a bed out to him.

## 2015-03-05 NOTE — Telephone Encounter (Signed)
I tried to call the patient's mother. Unable to reach her or leave a voicemail. The order for the hospital bed and 3 in 1 commode has been placed. I called Mary with [facility] and she is checking on the status of these orders.

## 2015-03-05 NOTE — Addendum Note (Signed)
Addended by: Stephanie Acre on: 03/05/2015 02:07 PM   Modules accepted: Orders, Medications

## 2015-03-05 NOTE — Telephone Encounter (Signed)
I tried to call the mother. Left a message, I will call back later.

## 2015-03-05 NOTE — Telephone Encounter (Signed)
Mom called checking the status of hospital bed. Mom is also concerned about the way he is behaving with medication lithium 300 MG tablet (drooling, very lethargic, slurring speech, Mom can't understand him, off balance-fell on [place] this morning. Mom didn't know he had left the house until contacted by [place] [employer] (patient wasn't using his walker) Mom has pulled him off of this medication. Not resting well. Hasn't gotten sleep in 2 nights, would like to know if they could switch out traZODone (DESYREL) 100 MG tablet. Dr. Ocie Doyne at [facility] stopped fluPHENAZine (PROLIXIN) 10 MG tablet on 8/26, will be starting Abilify (will start with pills then go to injections). Mom feels like he is on too much medication.

## 2015-03-06 ENCOUNTER — Emergency Department (HOSPITAL_COMMUNITY): Payer: Medicare Other

## 2015-03-06 ENCOUNTER — Encounter (HOSPITAL_COMMUNITY): Payer: Self-pay | Admitting: *Deleted

## 2015-03-06 ENCOUNTER — Emergency Department (HOSPITAL_COMMUNITY)
Admission: EM | Admit: 2015-03-06 | Discharge: 2015-03-06 | Disposition: A | Discharge status: Home / Self Care | Payer: Medicare Other | Attending: Emergency Medicine | Admitting: Emergency Medicine

## 2015-03-06 ENCOUNTER — Telehealth: Payer: Self-pay | Admitting: Neurology

## 2015-03-06 DIAGNOSIS — Z8659 Personal history of other mental and behavioral disorders: Secondary | ICD-10-CM | POA: Diagnosis not present

## 2015-03-06 DIAGNOSIS — S199XXA Unspecified injury of neck, initial encounter: Secondary | ICD-10-CM | POA: Diagnosis present

## 2015-03-06 DIAGNOSIS — G35 Multiple sclerosis: Secondary | ICD-10-CM

## 2015-03-06 DIAGNOSIS — S161XXA Strain of muscle, fascia and tendon at neck level, initial encounter: Secondary | ICD-10-CM | POA: Diagnosis not present

## 2015-03-06 DIAGNOSIS — W19XXXA Unspecified fall, initial encounter: Secondary | ICD-10-CM

## 2015-03-06 DIAGNOSIS — Y9389 Activity, other specified: Secondary | ICD-10-CM | POA: Diagnosis not present

## 2015-03-06 DIAGNOSIS — W1839XA Other fall on same level, initial encounter: Secondary | ICD-10-CM | POA: Insufficient documentation

## 2015-03-06 DIAGNOSIS — S0990XA Unspecified injury of head, initial encounter: Secondary | ICD-10-CM | POA: Insufficient documentation

## 2015-03-06 DIAGNOSIS — Z720 Tobacco use: Secondary | ICD-10-CM | POA: Diagnosis not present

## 2015-03-06 DIAGNOSIS — Z79899 Other long term (current) drug therapy: Secondary | ICD-10-CM | POA: Diagnosis not present

## 2015-03-06 DIAGNOSIS — I10 Essential (primary) hypertension: Secondary | ICD-10-CM | POA: Diagnosis not present

## 2015-03-06 DIAGNOSIS — Y998 Other external cause status: Secondary | ICD-10-CM | POA: Insufficient documentation

## 2015-03-06 DIAGNOSIS — Y9289 Other specified places as the place of occurrence of the external cause: Secondary | ICD-10-CM | POA: Diagnosis not present

## 2015-03-06 DIAGNOSIS — E669 Obesity, unspecified: Secondary | ICD-10-CM | POA: Insufficient documentation

## 2015-03-06 DIAGNOSIS — R269 Unspecified abnormalities of gait and mobility: Secondary | ICD-10-CM

## 2015-03-06 DIAGNOSIS — G8194 Hemiplegia, unspecified affecting left nondominant side: Secondary | ICD-10-CM

## 2015-03-06 LAB — URINALYSIS, ROUTINE W REFLEX MICROSCOPIC
Glucose, UA: NEGATIVE mg/dL
Hgb urine dipstick: NEGATIVE
Ketones, ur: NEGATIVE mg/dL
Leukocytes, UA: NEGATIVE
Nitrite: NEGATIVE
Protein, ur: NEGATIVE mg/dL
Specific Gravity, Urine: 1.029 (ref 1.005–1.030)
Urobilinogen, UA: 4 mg/dL — ABNORMAL HIGH (ref 0.0–1.0)
pH: 6 (ref 5.0–8.0)

## 2015-03-06 LAB — COMPREHENSIVE METABOLIC PANEL
ALT: 26 U/L (ref 17–63)
AST: 15 U/L (ref 15–41)
Albumin: 4.1 g/dL (ref 3.5–5.0)
Alkaline Phosphatase: 65 U/L (ref 38–126)
Anion gap: 7 (ref 5–15)
BUN: 11 mg/dL (ref 6–20)
CO2: 27 mmol/L (ref 22–32)
Calcium: 9.4 mg/dL (ref 8.9–10.3)
Chloride: 107 mmol/L (ref 101–111)
Creatinine, Ser: 1.11 mg/dL (ref 0.61–1.24)
GFR calc Af Amer: >60 mL/min (ref >60)
GFR calc non Af Amer: >60 mL/min (ref >60)
Glucose, Bld: 93 mg/dL (ref 65–99)
Potassium: 3.4 mmol/L — ABNORMAL LOW (ref 3.5–5.1)
Sodium: 141 mmol/L (ref 135–145)
Total Bilirubin: 0.5 mg/dL (ref 0.3–1.2)
Total Protein: 6.9 g/dL (ref 6.5–8.1)

## 2015-03-06 LAB — CBC WITH DIFFERENTIAL/PLATELET
Basophils Absolute: 0 10*3/uL (ref 0.0–0.1)
Basophils Relative: 0 % (ref 0–1)
Eosinophils Absolute: 0.1 10*3/uL (ref 0.0–0.7)
Eosinophils Relative: 1 % (ref 0–5)
HCT: 39.4 % (ref 39.0–52.0)
Hemoglobin: 12.6 g/dL — ABNORMAL LOW (ref 13.0–17.0)
Lymphocytes Relative: 30 % (ref 12–46)
Lymphs Abs: 3.1 10*3/uL (ref 0.7–4.0)
MCH: 25.6 pg — ABNORMAL LOW (ref 26.0–34.0)
MCHC: 32 g/dL (ref 30.0–36.0)
MCV: 79.9 fL (ref 78.0–100.0)
Monocytes Absolute: 1 10*3/uL (ref 0.1–1.0)
Monocytes Relative: 9 % (ref 3–12)
Neutro Abs: 6.1 10*3/uL (ref 1.7–7.7)
Neutrophils Relative %: 60 % (ref 43–77)
Platelets: 152 10*3/uL (ref 150–400)
RBC: 4.93 MIL/uL (ref 4.22–5.81)
RDW: 14.9 % (ref 11.5–15.5)
WBC: 10.2 10*3/uL (ref 4.0–10.5)

## 2015-03-06 MED ORDER — SODIUM CHLORIDE 0.9 % IV BOLUS (SEPSIS)
1000.0000 mL | Freq: Once | INTRAVENOUS | Status: AC
  Administered 2015-03-06: 1000 mL via INTRAVENOUS

## 2015-03-06 NOTE — ED Notes (Signed)
Pt reports striking head during fall. He c/o pain in head,neck and left side near ribs.

## 2015-03-06 NOTE — ED Notes (Signed)
Dr. Criss Alvine made aware of pt's ability to walk with a walker.

## 2015-03-06 NOTE — ED Notes (Addendum)
Per patients mother Jamerius Reavis [phone]) he recently received solumedrol several injections d/t MS exacerbation. Pt has been participating with HHPT. Pt has hx of "behavioral issues". Pt recently diagnosed with possible "dysphagia"Mother stated that she called because she did not want him to get more doses of the steroid.

## 2015-03-06 NOTE — ED Notes (Signed)
Pt walked to the doorway and back without assistance. Pt was steady on his feet.

## 2015-03-06 NOTE — Telephone Encounter (Signed)
I will order a hospital bed for the home environment. The patient has multiple sclerosis, with a gait disorder, left hemiparesis. He requires a hospital bed for proper positioning, ease with bed to chair transfers, increased safety. I will order a semi-electric hospital bed.

## 2015-03-06 NOTE — ED Notes (Signed)
pts mother contacted, father umair rosiles will be coming

## 2015-03-06 NOTE — ED Notes (Signed)
Bed: GE36 Expected date: 03/06/15 Expected time:  Means of arrival:  Comments: EMS/ 33yr fall

## 2015-03-06 NOTE — Discharge Instructions (Signed)
Cervical Sprain °A cervical sprain is an injury in the neck in which the strong, fibrous tissues (ligaments) that connect your neck bones stretch or tear. Cervical sprains can range from mild to severe. Severe cervical sprains can cause the neck vertebrae to be unstable. This can lead to damage of the spinal cord and can result in serious nervous system problems. The amount of time it takes for a cervical sprain to get better depends on the cause and extent of the injury. Most cervical sprains heal in 1 to 3 weeks. °CAUSES  °Severe cervical sprains may be caused by:  °· Contact sport injuries (such as from football, rugby, wrestling, hockey, auto racing, gymnastics, diving, martial arts, or boxing).   °· Motor vehicle collisions.   °· Whiplash injuries. This is an injury from a sudden forward and backward whipping movement of the head and neck.  °· Falls.   °Mild cervical sprains may be caused by:  °· Being in an awkward position, such as while cradling a telephone between your ear and shoulder.   °· Sitting in a chair that does not offer proper support.   °· Working at a poorly designed computer station.   °· Looking up or down for long periods of time.   °SYMPTOMS  °· Pain, soreness, stiffness, or a burning sensation in the front, back, or sides of the neck. This discomfort may develop immediately after the injury or slowly, 24 hours or more after the injury.   °· Pain or tenderness directly in the middle of the back of the neck.   °· Shoulder or upper back pain.   °· Limited ability to move the neck.   °· Headache.   °· Dizziness.   °· Weakness, numbness, or tingling in the hands or arms.   °· Muscle spasms.   °· Difficulty swallowing or chewing.   °· Tenderness and swelling of the neck.   °DIAGNOSIS  °Most of the time your health care provider can diagnose a cervical sprain by taking your history and doing a physical exam. Your health care provider will ask about previous neck injuries and any known neck  problems, such as arthritis in the neck. X-rays may be taken to find out if there are any other problems, such as with the bones of the neck. Other tests, such as a CT scan or MRI, may also be needed.  °TREATMENT  °Treatment depends on the severity of the cervical sprain. Mild sprains can be treated with rest, keeping the neck in place (immobilization), and pain medicines. Severe cervical sprains are immediately immobilized. Further treatment is done to help with pain, muscle spasms, and other symptoms and may include: °· Medicines, such as pain relievers, numbing medicines, or muscle relaxants.   °· Physical therapy. This may involve stretching exercises, strengthening exercises, and posture training. Exercises and improved posture can help stabilize the neck, strengthen muscles, and help stop symptoms from returning.   °HOME CARE INSTRUCTIONS  °· Put ice on the injured area.   °¨ Put ice in a plastic bag.   °¨ Place a towel between your skin and the bag.   °¨ Leave the ice on for 15-20 minutes, 3-4 times a day.   °· If your injury was severe, you may have been given a cervical collar to wear. A cervical collar is a two-piece collar designed to keep your neck from moving while it heals. °¨ Do not remove the collar unless instructed by your health care provider. °¨ If you have long hair, keep it outside of the collar. °¨ Ask your health care provider before making any adjustments to your collar. Minor   adjustments may be required over time to improve comfort and reduce pressure on your chin or on the back of your head.  Ifyou are allowed to remove the collar for cleaning or bathing, follow your health care provider's instructions on how to do so safely.  Keep your collar clean by wiping it with mild soap and water and drying it completely. If the collar you have been given includes removable pads, remove them every 1-2 days and hand wash them with soap and water. Allow them to air dry. They should be completely  dry before you wear them in the collar.  If you are allowed to remove the collar for cleaning and bathing, wash and dry the skin of your neck. Check your skin for irritation or sores. If you see any, tell your health care provider.  Do not drive while wearing the collar.   Only take over-the-counter or prescription medicines for pain, discomfort, or fever as directed by your health care provider.   Keep all follow-up appointments as directed by your health care provider.   Keep all physical therapy appointments as directed by your health care provider.   Make any needed adjustments to your workstation to promote good posture.   Avoid positions and activities that make your symptoms worse.   Warm up and stretch before being active to help prevent problems.  SEEK MEDICAL CARE IF:   Your pain is not controlled with medicine.   You are unable to decrease your pain medicine over time as planned.   Your activity level is not improving as expected.  SEEK IMMEDIATE MEDICAL CARE IF:   You develop any bleeding.  You develop stomach upset.  You have signs of an allergic reaction to your medicine.   Your symptoms get worse.   You develop new, unexplained symptoms.   You have numbness, tingling, weakness, or paralysis in any part of your body.  MAKE SURE YOU:   Understand these instructions.  Will watch your condition.  Will get help right away if you are not doing well or get worse. Document Released: 04/20/2007 Document Revised: 06/28/2013 Document Reviewed: 12/29/2012 [facility] Patient Information 2015 [place], [place]. This information is not intended to replace advice given to you by your health care provider. Make sure you discuss any questions you have with your health care provider.    Multiple Sclerosis Multiple sclerosis (MS) is a disease of the central nervous system. It leads to the loss of the insulating covering of the nerves (myelin sheath) of your  brain. When this happens, brain signals do not get sent properly or may not get sent at all. The age of onset of MS varies.  CAUSES The cause of MS is unknown. However, it is more common in the [place] than in the [place]. RISK FACTORS There is a higher number of women with MS than men. MS is not an illness that is passed down to you from your family members (inherited). However, your risk of MS is higher if you have a relative with MS. SIGNS AND SYMPTOMS  The symptoms of MS occur in episodes or attacks. These attacks may last weeks to months. There may be long periods of almost no symptoms between attacks. The symptoms of MS vary. This is because of the many different ways it affects the central nervous system. The main symptoms of MS include:  Vision problems and eye pain.  Numbness.  Weakness.  Inability to move your arms, hands, feet, or  legs (paralysis).  Balance problems.  Tremors. DIAGNOSIS  Your health care provider can diagnose MS with the help of imaging exams and lab tests. These may include specialized X-ray exams and spinal fluid tests. The best imaging exam to confirm a diagnosis of MS is an MRI. TREATMENT  There is no known cure for MS, but there are medicines that can decrease the number and frequency of attacks. Steroids are often used for short-term relief. Physical and occupational therapy may also help. There are also many new alternative or complementary treatments available to help control the symptoms of MS. Ask your health care provider if any of these other options are right for you. HOME CARE INSTRUCTIONS   Take medicines as directed by your health care provider.  Exercise as directed by your health care provider. SEEK MEDICAL CARE IF: You begin to feel depressed. SEEK IMMEDIATE MEDICAL CARE IF:  You develop paralysis.  You have problems with bladder, bowel, or sexual function.  You develop mental changes, such as  forgetfulness or mood swings.  You have a period of uncontrolled movements (seizure). Document Released: 06/20/2000 Document Revised: 06/28/2013 Document Reviewed: 02/28/2013 [facility] Patient Information 2015 [place], [place]. This information is not intended to replace advice given to you by your health care provider. Make sure you discuss any questions you have with your health care provider.

## 2015-03-06 NOTE — ED Notes (Signed)
EMS contacted by a Gas station attendant. Pt was found behind [employer] station, Pt reports falling over plastic crates. Pt is ambulatory but when he becomes tired he will fall over. Pt has hx of MS and is A&Ox4. Pt is able to answer questions appropriately but is slow to respond.

## 2015-03-06 NOTE — ED Notes (Signed)
Patient transported to CT 

## 2015-03-06 NOTE — ED Provider Notes (Signed)
CSN: [id number]     Arrival date & time 03/06/15  2016 History   First MD Initiated Contact with Patient 03/06/15 2044     Chief Complaint  Patient presents with  . Fall     (Consider location/radiation/quality/duration/timing/severity/associated sxs/prior Treatment) HPI  24 year old male with a history of multiple sclerosis presents with a fall at a gas station. He states that he was out smoking and fell over a crate. The patient has chronic left lower extremity weakness and states that his leg gave out on him. This is not an atypical occurrence. States he hit the back of his head and his neck in the middle and left side are hurting. Feels diffusely weak more than typical and has felt this way for the past 2 weeks. Typically ambulates with a walker. No abdominal pain, chest pain, shortness of breath, or back pain. No urinary symptoms. No fevers.  Past Medical History  Diagnosis Date  . ADHD (attention deficit hyperactivity disorder)   . Hypertension   . Schizophrenia   . Bipolar 1 disorder   . 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 Obesity   . Multiple sclerosis 05/20/2013   Past Surgical History  Procedure Laterality Date  . None     Family History  Problem Relation Age of Onset  . Diabetes Mother   . ADD / ADHD Brother    Social History  Substance Use Topics  . Smoking status: Current Every Day Smoker -- 0.25 packs/day    Types: Cigarettes  . Smokeless tobacco: Never Used  . Alcohol Use: No     Comment: sometimes    Review of Systems  Constitutional: Negative for fever.  Respiratory: Negative for shortness of breath.   Cardiovascular: Negative for chest pain.  Gastrointestinal: Negative for vomiting, abdominal pain and diarrhea.  Genitourinary: Negative for dysuria.  Musculoskeletal: Positive for neck pain.  Neurological: Positive for weakness and headaches.  All other systems reviewed and are negative.     Allergies  Review of patient's  allergies indicates no known allergies.  Home Medications   Prior to Admission medications   Medication Sig Start Date End Date Taking? Authorizing Provider  amLODipine (NORVASC) 10 MG tablet Take 10 mg by mouth daily.  11/30/14  Yes Historical Provider, MD  ARIPiprazole 400 MG SUSR Inject 400 mg into the muscle every 14 (fourteen) days.    Yes Historical Provider, MD  folic acid (FOLVITE) 1 MG tablet Take 1 mg by mouth daily.   Yes Historical Provider, MD  traZODone (DESYREL) 100 MG tablet Take 200 mg by mouth at bedtime.  12/05/14  Yes Historical Provider, MD  benztropine (COGENTIN) 1 MG tablet Take 1 mg by mouth 2 (two) times daily. 02/26/15   Historical Provider, MD  DOCUSATE SODIUM PO Take 100 mg by mouth.    Historical Provider, MD  metFORMIN (GLUCOPHAGE) 500 MG tablet Take by mouth 2 (two) times daily with a meal.    Historical Provider, MD  Vitamin D, Ergocalciferol, (DRISDOL) 50000 UNITS CAPS capsule Take 50,000 Units by mouth every 7 (seven) days.    Historical Provider, MD   BP 150/71 mmHg  Pulse 62  Temp(Src) 98 F (36.7 C)  Resp 18  SpO2 100% Physical Exam  Constitutional: He is oriented to person, place, and time. He appears well-developed and well-nourished.  HENT:  Head: Normocephalic and atraumatic.  Right Ear: External ear normal.  Left Ear: External ear normal.  Nose: Nose normal.  Eyes: EOM are  normal. Pupils are equal, round, and reactive to light. Right eye exhibits no discharge. Left eye exhibits no discharge.  Neck: Neck supple.  Cardiovascular: Normal rate, regular rhythm, normal heart sounds and intact distal pulses.   Pulmonary/Chest: Effort normal and breath sounds normal.  Abdominal: Soft. He exhibits no distension. There is no tenderness.  Musculoskeletal: He exhibits no edema.  Neurological: He is alert and oriented to person, place, and time.  CN 2-12 grossly intact. 5/5 strength in all 4 extremities but LLE seems a little weaker than right but  technically 5/5  Skin: Skin is warm and dry.  Nursing note and vitals reviewed.   ED Course  Procedures (including critical care time) Labs Review Labs Reviewed  COMPREHENSIVE METABOLIC PANEL - Abnormal; Notable for the following:    Potassium 3.4 (*)    All other components within normal limits  CBC WITH DIFFERENTIAL/PLATELET - Abnormal; Notable for the following:    Hemoglobin 12.6 (*)    MCH 25.6 (*)    All other components within normal limits  URINALYSIS, ROUTINE W REFLEX MICROSCOPIC (NOT AT [facility]) - Abnormal; Notable for the following:    Color, Urine AMBER (*)    Bilirubin Urine MODERATE (*)    Urobilinogen, UA 4.0 (*)    All other components within normal limits    Imaging Review Ct Head Wo Contrast  03/06/2015   CLINICAL DATA:  Fall, head trauma  EXAM: CT HEAD WITHOUT CONTRAST  CT CERVICAL SPINE WITHOUT CONTRAST  TECHNIQUE: Multidetector CT imaging of the head and cervical spine was performed following the standard protocol without intravenous contrast. Multiplanar CT image reconstructions of the cervical spine were also generated.  COMPARISON:  Head CT 01/18/2014, cervical spine CT 07/03/2011  FINDINGS: CT HEAD FINDINGS  Cavum vergae incidentally noted. No acute hemorrhage, infarct, or mass lesion is identified. Periventricular white matter hypodensity reidentified. Nasal bone deformity reidentified. No acute skull fracture. Paranasal sinuses are clear.  CT CERVICAL SPINE FINDINGS  Extensive streak artifact/noise from patient body habitus. Loss of the normal cervical lordosis may be positional. C1 through the cervicothoracic junction is visualized in its entirety. No precervical soft tissue widening. No fracture or dislocation.  IMPRESSION: No acute intracranial finding.  Chronic findings as above.  No acute cervical spine fracture.   Electronically Signed   By: Christiana Pellant M.D.   On: 03/06/2015 22:56   Ct Cervical Spine Wo Contrast  03/06/2015   CLINICAL DATA:  Fall, head  trauma  EXAM: CT HEAD WITHOUT CONTRAST  CT CERVICAL SPINE WITHOUT CONTRAST  TECHNIQUE: Multidetector CT imaging of the head and cervical spine was performed following the standard protocol without intravenous contrast. Multiplanar CT image reconstructions of the cervical spine were also generated.  COMPARISON:  Head CT 01/18/2014, cervical spine CT 07/03/2011  FINDINGS: CT HEAD FINDINGS  Cavum vergae incidentally noted. No acute hemorrhage, infarct, or mass lesion is identified. Periventricular white matter hypodensity reidentified. Nasal bone deformity reidentified. No acute skull fracture. Paranasal sinuses are clear.  CT CERVICAL SPINE FINDINGS  Extensive streak artifact/noise from patient body habitus. Loss of the normal cervical lordosis may be positional. C1 through the cervicothoracic junction is visualized in its entirety. No precervical soft tissue widening. No fracture or dislocation.  IMPRESSION: No acute intracranial finding.  Chronic findings as above.  No acute cervical spine fracture.   Electronically Signed   By: Christiana Pellant M.D.   On: 03/06/2015 22:56   I have personally reviewed and evaluated these images and lab  results as part of my medical decision-making.   EKG Interpretation None      MDM   Final diagnoses:  Fall, initial encounter  Cervical strain, acute, initial encounter  Multiple sclerosis    Patient with a fall and subsequent head injury. No laceration or significant trauma noted. CT head and C-spine are both negative. I do not feel further testing is indicated at this time. Patient does have mild left lower extremity weakness that does not appear new based on chart review and patient report. Patient is able to get up and ambulate with a walker and ambulate quite well. I doubt this is an MS flare that would require steroids. Stable for discharge home to follow-up with PCP and neurologist.    Pricilla Loveless, MD 03/06/15 [phone]

## 2015-03-06 NOTE — Telephone Encounter (Signed)
I called Brandon [name] with [facility]. She has been in contact with [facility]. I advised her that Dr. Anne Hahn had placed the orders for the hospital bed. She is following up with [facility] to see that the hospital bed is sent to the home today or tomorrow.

## 2015-03-08 ENCOUNTER — Encounter (HOSPITAL_COMMUNITY)
Admission: RE | Admit: 2015-03-08 | Discharge: 2015-03-08 | Disposition: A | Discharge status: Home / Self Care | Payer: Medicare Other | Source: Ambulatory Visit | Attending: Neurology | Admitting: Neurology

## 2015-03-08 VITALS — BP 123/53 | HR 57 | Temp 97.8°F | Resp 18 | Ht 73.0 in | Wt 293.0 lb

## 2015-03-08 DIAGNOSIS — G35 Multiple sclerosis: Secondary | ICD-10-CM | POA: Diagnosis not present

## 2015-03-08 MED ORDER — LORATADINE 10 MG PO TABS
10.0000 mg | ORAL_TABLET | ORAL | Status: DC
  Administered 2015-03-08: 10 mg via ORAL
  Filled 2015-03-08: qty 1

## 2015-03-08 MED ORDER — SODIUM CHLORIDE 0.9 % IV SOLN
INTRAVENOUS | Status: AC
  Administered 2015-03-08: 09:00:00 via INTRAVENOUS

## 2015-03-08 MED ORDER — ACETAMINOPHEN 325 MG PO TABS
650.0000 mg | ORAL_TABLET | ORAL | Status: DC
  Administered 2015-03-08: 650 mg via ORAL
  Filled 2015-03-08: qty 2

## 2015-03-08 MED ORDER — SODIUM CHLORIDE 0.9 % IV SOLN
300.0000 mg | INTRAVENOUS | Status: DC
  Administered 2015-03-08: 300 mg via INTRAVENOUS
  Filled 2015-03-08: qty 15

## 2015-03-08 NOTE — Telephone Encounter (Signed)
I spoke to [place]. [facility] is set to deliver the hospital bed today between 2 and 6 PM.

## 2015-03-12 ENCOUNTER — Emergency Department (HOSPITAL_COMMUNITY): Payer: Medicare Other

## 2015-03-12 ENCOUNTER — Emergency Department (HOSPITAL_COMMUNITY)
Admission: EM | Admit: 2015-03-12 | Discharge: 2015-03-13 | Disposition: A | Discharge status: Home / Self Care | Payer: Medicare Other | Attending: Emergency Medicine | Admitting: Emergency Medicine

## 2015-03-12 ENCOUNTER — Encounter (HOSPITAL_COMMUNITY): Payer: Self-pay

## 2015-03-12 DIAGNOSIS — Y9302 Activity, running: Secondary | ICD-10-CM | POA: Insufficient documentation

## 2015-03-12 DIAGNOSIS — Y999 Unspecified external cause status: Secondary | ICD-10-CM | POA: Diagnosis not present

## 2015-03-12 DIAGNOSIS — W19XXXA Unspecified fall, initial encounter: Secondary | ICD-10-CM

## 2015-03-12 DIAGNOSIS — S199XXA Unspecified injury of neck, initial encounter: Secondary | ICD-10-CM | POA: Insufficient documentation

## 2015-03-12 DIAGNOSIS — Y929 Unspecified place or not applicable: Secondary | ICD-10-CM | POA: Diagnosis not present

## 2015-03-12 DIAGNOSIS — E669 Obesity, unspecified: Secondary | ICD-10-CM | POA: Insufficient documentation

## 2015-03-12 DIAGNOSIS — G8929 Other chronic pain: Secondary | ICD-10-CM | POA: Diagnosis not present

## 2015-03-12 DIAGNOSIS — I10 Essential (primary) hypertension: Secondary | ICD-10-CM | POA: Insufficient documentation

## 2015-03-12 DIAGNOSIS — R4182 Altered mental status, unspecified: Secondary | ICD-10-CM

## 2015-03-12 DIAGNOSIS — Z720 Tobacco use: Secondary | ICD-10-CM | POA: Insufficient documentation

## 2015-03-12 DIAGNOSIS — D649 Anemia, unspecified: Secondary | ICD-10-CM | POA: Insufficient documentation

## 2015-03-12 DIAGNOSIS — W01198A Fall on same level from slipping, tripping and stumbling with subsequent striking against other object, initial encounter: Secondary | ICD-10-CM | POA: Diagnosis not present

## 2015-03-12 DIAGNOSIS — Z79899 Other long term (current) drug therapy: Secondary | ICD-10-CM | POA: Diagnosis not present

## 2015-03-12 LAB — COMPREHENSIVE METABOLIC PANEL
ALT: 26 U/L (ref 17–63)
AST: 15 U/L (ref 15–41)
Albumin: 3.5 g/dL (ref 3.5–5.0)
Alkaline Phosphatase: 59 U/L (ref 38–126)
Anion gap: 9 (ref 5–15)
BUN: 10 mg/dL (ref 6–20)
CO2: 25 mmol/L (ref 22–32)
Calcium: 8.9 mg/dL (ref 8.9–10.3)
Chloride: 104 mmol/L (ref 101–111)
Creatinine, Ser: 1.1 mg/dL (ref 0.61–1.24)
GFR calc Af Amer: >60 mL/min (ref >60)
GFR calc non Af Amer: >60 mL/min (ref >60)
Glucose, Bld: 103 mg/dL — ABNORMAL HIGH (ref 65–99)
Potassium: 3.1 mmol/L — ABNORMAL LOW (ref 3.5–5.1)
Sodium: 138 mmol/L (ref 135–145)
Total Bilirubin: 0.4 mg/dL (ref 0.3–1.2)
Total Protein: 5.8 g/dL — ABNORMAL LOW (ref 6.5–8.1)

## 2015-03-12 LAB — CBC WITH DIFFERENTIAL/PLATELET
Basophils Absolute: 0 10*3/uL (ref 0.0–0.1)
Basophils Relative: 1 % (ref 0–1)
Eosinophils Absolute: 0.2 10*3/uL (ref 0.0–0.7)
Eosinophils Relative: 3 % (ref 0–5)
HCT: 38.8 % — ABNORMAL LOW (ref 39.0–52.0)
Hemoglobin: 12.2 g/dL — ABNORMAL LOW (ref 13.0–17.0)
Lymphocytes Relative: 39 % (ref 12–46)
Lymphs Abs: 2.9 10*3/uL (ref 0.7–4.0)
MCH: 25.1 pg — ABNORMAL LOW (ref 26.0–34.0)
MCHC: 31.4 g/dL (ref 30.0–36.0)
MCV: 79.7 fL (ref 78.0–100.0)
Monocytes Absolute: 0.5 10*3/uL (ref 0.1–1.0)
Monocytes Relative: 7 % (ref 3–12)
Neutro Abs: 3.7 10*3/uL (ref 1.7–7.7)
Neutrophils Relative %: 50 % (ref 43–77)
Platelets: 133 10*3/uL — ABNORMAL LOW (ref 150–400)
RBC: 4.87 MIL/uL (ref 4.22–5.81)
RDW: 15 % (ref 11.5–15.5)
WBC: 7.4 10*3/uL (ref 4.0–10.5)

## 2015-03-12 LAB — ETHANOL: Alcohol, Ethyl (B): <5 mg/dL (ref <5)

## 2015-03-12 NOTE — ED Notes (Signed)
C-collar placed by Amy, EMT

## 2015-03-12 NOTE — ED Provider Notes (Signed)
CSN: [id number]     Arrival date & time 03/12/15  2153 History   First MD Initiated Contact with Patient 03/12/15 2213     Chief Complaint  Patient presents with  . Fall     HPI   Brandon [name] is a 24 y.o. male with a PMH of HTN, MS, obesity, bipolar, schizophrenia who presents to the ED s/p fall. He reports he was running outside of his house when he fell, and states he hit his head on a car. Also reports using ecstasy today. Patient is a poor historian, however reports neck pain in the ED. Denies chest pain, shortness of breath, abdominal pain, extremity pain, numbness, paresthesia.    Past Medical History  Diagnosis Date  . ADHD (attention deficit hyperactivity disorder)   . Hypertension   . Schizophrenia   . Bipolar 1 disorder   . 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 Obesity   . Multiple sclerosis 05/20/2013   Past Surgical History  Procedure Laterality Date  . None     Family History  Problem Relation Age of Onset  . Diabetes Mother   . ADD / ADHD Brother    Social History  Substance Use Topics  . Smoking status: Current Every Day Smoker -- 0.25 packs/day    Types: Cigarettes  . Smokeless tobacco: Never Used  . Alcohol Use: No     Comment: sometimes     Review of Systems  Constitutional: Negative for fever, chills, activity change, appetite change and fatigue.  Eyes: Negative for visual disturbance.  Respiratory: Negative for shortness of breath.   Cardiovascular: Negative for chest pain.  Gastrointestinal: Negative for nausea, vomiting, abdominal pain, diarrhea and constipation.  Musculoskeletal: Positive for neck pain. Negative for myalgias, back pain, arthralgias, gait problem and neck stiffness.  Skin: Negative for color change, pallor, rash and wound.  Neurological: Negative for dizziness, weakness, light-headedness, numbness and headaches.  All other systems reviewed and are negative.     Allergies  Review of patient's allergies  indicates no known allergies.  Home Medications   Prior to Admission medications   Medication Sig Start Date End Date Taking? Authorizing Provider  amLODipine (NORVASC) 10 MG tablet Take 10 mg by mouth daily.  11/30/14   Historical Provider, MD  ARIPiprazole 400 MG SUSR Inject 400 mg into the muscle every 14 (fourteen) days.     Historical Provider, MD  benztropine (COGENTIN) 1 MG tablet Take 1 mg by mouth 2 (two) times daily. 02/26/15   Historical Provider, MD  DOCUSATE SODIUM PO Take 100 mg by mouth.    Historical Provider, MD  folic acid (FOLVITE) 1 MG tablet Take 1 mg by mouth daily.    Historical Provider, MD  Melatonin 5 MG TABS Take 1 tablet by mouth at bedtime.    Historical Provider, MD  metFORMIN (GLUCOPHAGE) 500 MG tablet Take by mouth 2 (two) times daily with a meal.    Historical Provider, MD  traZODone (DESYREL) 100 MG tablet Take 200 mg by mouth at bedtime.  12/05/14   Historical Provider, MD  Vitamin D, Ergocalciferol, (DRISDOL) 50000 UNITS CAPS capsule Take 50,000 Units by mouth every 7 (seven) days.    Historical Provider, MD    BP 111/90 mmHg  Pulse 57  Resp 11  SpO2 97% Physical Exam  Constitutional: He is oriented to person, place, and time. No distress.  Obese male, appears drowsy.  HENT:  Head: Normocephalic and atraumatic.  Right Ear: External  ear normal.  Left Ear: External ear normal.  Nose: Nose normal.  Mouth/Throat: Uvula is midline, oropharynx is clear and moist and mucous membranes are normal.  Eyes: Conjunctivae, EOM and lids are normal. Pupils are equal, round, and reactive to light. Right eye exhibits no discharge. Left eye exhibits no discharge. No scleral icterus.  Neck: Normal range of motion. Neck supple.  Cardiovascular: Normal rate, regular rhythm, normal heart sounds, intact distal pulses and normal pulses.   Pulmonary/Chest: Effort normal and breath sounds normal. No respiratory distress. He has no wheezes. He has no rales. He exhibits no  tenderness.  Abdominal: Soft. Normal appearance and bowel sounds are normal. He exhibits no distension and no mass. There is no tenderness. There is no rigidity, no rebound and no guarding.  Musculoskeletal: Normal range of motion. He exhibits tenderness. He exhibits no edema.  TTP of cervical spine at midline. No step-off or deformity.  Neurological: He is oriented to person, place, and time. He has normal strength. No cranial nerve deficit or sensory deficit. GCS eye subscore is 4. GCS verbal subscore is 5. GCS motor subscore is 6.  Appears drowsy, falls asleep during conversation.  Skin: Skin is warm, dry and intact. No rash noted. He is not diaphoretic. No erythema. No pallor.  Psychiatric: He has a normal mood and affect. His speech is normal and behavior is normal. Judgment and thought content normal.  Nursing note and vitals reviewed.   ED Course  Procedures (including critical care time)  Labs Review Labs Reviewed  CBC WITH DIFFERENTIAL/PLATELET - Abnormal; Notable for the following:    Hemoglobin 12.2 (*)    HCT 38.8 (*)    MCH 25.1 (*)    Platelets 133 (*)    All other components within normal limits  COMPREHENSIVE METABOLIC PANEL - Abnormal; Notable for the following:    Potassium 3.1 (*)    Glucose, Bld 103 (*)    Total Protein 5.8 (*)    All other components within normal limits  ETHANOL  URINALYSIS, ROUTINE W REFLEX MICROSCOPIC (NOT AT [facility])  URINE RAPID DRUG SCREEN, HOSP PERFORMED  CBG MONITORING, ED    Imaging Review Ct Head Wo Contrast  03/13/2015   CLINICAL DATA:  24 year old male with altered mental status.  Stop  EXAM: CT HEAD WITHOUT CONTRAST  CT CERVICAL SPINE WITHOUT CONTRAST  TECHNIQUE: Multidetector CT imaging of the head and cervical spine was performed following the standard protocol without intravenous contrast. Multiplanar CT image reconstructions of the cervical spine were also generated.  COMPARISON:  CT dated 03/06/2015  FINDINGS: CT HEAD FINDINGS   Stable mild diffuse brain atrophy and white matter hypodensities again noted which are advanced for the patient's age. A cavum vergae is incidentally seen. There is no intracranial hemorrhage. No mass effect or midline shift identified.  The visualized paranasal sinuses and mastoid air cells are well aerated. The calvarium is intact.  CT CERVICAL SPINE FINDINGS  There is no acute fracture or subluxation of the cervical spine.The intervertebral disc spaces are preserved.The odontoid and spinous processes are intact.There is normal anatomic alignment of the C1-C2 lateral masses. The visualized soft tissues appear unremarkable.  IMPRESSION: No acute intracranial pathology. Stable mild atrophy and chronic white matter disease advanced for the patient's age.  No acute cervical spine pathology.   Electronically Signed   By: Elgie Collard M.D.   On: 03/13/2015 01:28   Ct Cervical Spine Wo Contrast  03/13/2015   CLINICAL DATA:  24 year old male with  altered mental status.  Stop  EXAM: CT HEAD WITHOUT CONTRAST  CT CERVICAL SPINE WITHOUT CONTRAST  TECHNIQUE: Multidetector CT imaging of the head and cervical spine was performed following the standard protocol without intravenous contrast. Multiplanar CT image reconstructions of the cervical spine were also generated.  COMPARISON:  CT dated 03/06/2015  FINDINGS: CT HEAD FINDINGS  Stable mild diffuse brain atrophy and white matter hypodensities again noted which are advanced for the patient's age. A cavum vergae is incidentally seen. There is no intracranial hemorrhage. No mass effect or midline shift identified.  The visualized paranasal sinuses and mastoid air cells are well aerated. The calvarium is intact.  CT CERVICAL SPINE FINDINGS  There is no acute fracture or subluxation of the cervical spine.The intervertebral disc spaces are preserved.The odontoid and spinous processes are intact.There is normal anatomic alignment of the C1-C2 lateral masses. The visualized soft  tissues appear unremarkable.  IMPRESSION: No acute intracranial pathology. Stable mild atrophy and chronic white matter disease advanced for the patient's age.  No acute cervical spine pathology.   Electronically Signed   By: Elgie Collard M.D.   On: 03/13/2015 01:28     I have personally reviewed and evaluated these images and lab results as part of my medical decision-making.   EKG Interpretation   Date/Time:  Monday March 12 2015 21:58:05 EDT Ventricular Rate:  65 PR Interval:  124 QRS Duration: 102 QT Interval:  419 QTC Calculation: 436 R Axis:   53 Text Interpretation:  Sinus arrhythmia ST elev, probable normal early  repol pattern No significant change since last tracing Confirmed by WARD,   DO, KRISTEN ([id number]) on 03/13/2015 1:48:53 AM      MDM   Final diagnoses:  Altered mental status    24 year old male presents s/p fall. Reports using ecstasy today. Patient is a poor historian, though reports neck pain in the ED. Denies chest pain, shortness of breath, abdominal pain, extremity pain, numbness, paresthesia.  C-collar placed. Will obtain head CT and c-spine imaging.  Patient is afebrile. Mild TTP of midline cervical spine. No step-off or deformity. Normal neuro exam with no focal neurological deficit. Strength and sensation intact. Patient is drowsy, though oriented x 4.  CBC with mild anemia, appears stable. Potassium 3.1 on CMP, repleted in the ED. Ethanol negative. UA, UDS pending. CT head negative for acute intracranial pathology. CT c-spine negative for acute fracture or subluxation of the cervical spine. C-collar removed.   Patient signed out to [place], PA-C. Plan is for patient to sober up in ED prior to discharge.   BP 122/79 mmHg  Pulse 52  Resp 11  SpO2 100%       Mady Gemma, PA-C 03/13/15 6045  Rolland Porter, MD 03/20/15 1919

## 2015-03-12 NOTE — ED Notes (Signed)
PER EMS: pt admits to taking ecstasy today and ran over to neighbors to call EMS. Pt leaned on side of a car, then kind of slid down to the ground unassisted but was witnessed by bystanders. No head injury. Pt arrives to ED, drowsy and lethargic. Pt able to follow commands. BP-110/70, HR-74, O2-99% RA. CBG-86.

## 2015-03-13 ENCOUNTER — Encounter (HOSPITAL_COMMUNITY): Payer: Self-pay | Admitting: Radiology

## 2015-03-13 DIAGNOSIS — S199XXA Unspecified injury of neck, initial encounter: Secondary | ICD-10-CM | POA: Diagnosis not present

## 2015-03-13 LAB — URINALYSIS, ROUTINE W REFLEX MICROSCOPIC
Glucose, UA: NEGATIVE mg/dL
Hgb urine dipstick: NEGATIVE
Ketones, ur: NEGATIVE mg/dL
Leukocytes, UA: NEGATIVE
Nitrite: NEGATIVE
Protein, ur: NEGATIVE mg/dL
Specific Gravity, Urine: 1.023 (ref 1.005–1.030)
Urobilinogen, UA: 1 mg/dL (ref 0.0–1.0)
pH: 6 (ref 5.0–8.0)

## 2015-03-13 LAB — RAPID URINE DRUG SCREEN, HOSP PERFORMED
Amphetamines: NOT DETECTED
Barbiturates: NOT DETECTED
Benzodiazepines: NOT DETECTED
Cocaine: NOT DETECTED
Opiates: NOT DETECTED
Tetrahydrocannabinol: NOT DETECTED

## 2015-03-13 LAB — CBG MONITORING, ED: Glucose-Capillary: 76 mg/dL (ref 65–99)

## 2015-03-13 MED ORDER — POTASSIUM CHLORIDE 20 MEQ/15ML (10%) PO SOLN
40.0000 meq | Freq: Once | ORAL | Status: AC
  Administered 2015-03-13: 40 meq via ORAL
  Filled 2015-03-13: qty 30

## 2015-03-13 MED ORDER — POTASSIUM CHLORIDE CRYS ER 20 MEQ PO TBCR
40.0000 meq | EXTENDED_RELEASE_TABLET | Freq: Once | ORAL | Status: DC
  Filled 2015-03-13: qty 2

## 2015-03-13 NOTE — Discharge Instructions (Signed)
Please follow up with your primary care physician in 1-2 days. If you do not have one please call the [facility] and wellness Center number listed above. Please read all discharge instructions and return precautions.   Ecstasy Abuse Ecstasy has been around for more than 80 years but recently achieved popularity as a "recreational drug". Ecstasy is taken by oral ingestion. Although teen use of most drugs has declined in the past 10 years, the use of ecstasy or "e" has gone up. The use of ecstasy affects the brain by increasing the release of serotonin in the brain. This is the "feel good" neurotransmitter between nerves. Studies done on people who have used ecstasy show poorer performance on memory tests, and computer images show that there are fewer serotonin receptors in ecstasy users, which may have irreversible consequences. Much of the ecstasy on the market is impure. A number of people taking this medication die of adulterants (contaminants) purposely added which raise the body temperature to a fatal level (hyperthermia). The use of ecstasy is also followed by a "crash". The crash is associated with depressed feelings, which cause a craving for the drug to regain the high or feeling of normalcy. WHEN IS DRUG USE A PROBLEM? Anytime drug use is interfering with normal living activities it has become abuse. This includes problems with family and friends. Psychological dependence has developed when your mind tells you that the drug is needed. Street drugs are filled with impurities which can cause serious problems or even death. If you have signs of chemical dependency or addiction, you should seek help immediately. There are many resources available to help combat alcoholism and chemical dependency. SOME OF THE SIGNS OF CHEMICAL DEPENDENCY ARE LISTED BELOW:  You have been told by friends or family that drugs have become a problem.  Changes in mood or behavior when using drugs.  Having blackouts (not  remembering what you do while using).  Lying about use or amounts of drugs (chemicals) used.  You feel that you need the drug to "get going" or function normally.  You suffer in work performance or school because of drug use.  Using drugs makes you ill but you continue to use anyway.  You feel you need drugs to relate to people or feel comfortable in social situations.  You use drugs to escape from problems. If you answered "yes" to any of the above signs of chemical dependency it indicates you have a problem. The longer the use of drugs continues, the greater the problems will become. If there is a family history of drug or alcohol abuse it is even more important not to experiment with drugs. Drug dependence definitely follows patterns of inheritance. HOW TO STAY DRUG FREE ONCE YOU HAVE QUIT USING  Develop healthy activities and form friends who do not use drugs.  Stay away from the drug scene.  Continue to utilize any and all outside resources to help you avoid continued or future drug [place] and dependency.  Develop a strong support system of friends or family who will help you avoid future drug use. Document Released: 06/20/2000 Document Revised: 09/15/2011 Document Reviewed: 09/09/2013 [facility] Patient Information 2015 [place], [place]. This information is not intended to replace advice given to you by your health care provider. Make sure you discuss any questions you have with your health care provider.

## 2015-03-13 NOTE — ED Provider Notes (Signed)
Patient care acquired from Glean Hess, PA-C pending re-evaluation.  Results for orders placed or performed during the hospital encounter of 03/12/15  CBC with Differential  Result Value Ref Range   WBC 7.4 4.0 - 10.5 K/uL   RBC 4.87 4.22 - 5.81 MIL/uL   Hemoglobin 12.2 (L) 13.0 - 17.0 g/dL   HCT 16.1 (L) 09.6 - 04.5 %   MCV 79.7 78.0 - 100.0 fL   MCH 25.1 (L) 26.0 - 34.0 pg   MCHC 31.4 30.0 - 36.0 g/dL   RDW 40.9 81.1 - 91.4 %   Platelets 133 (L) 150 - 400 K/uL   Neutrophils Relative % 50 43 - 77 %   Neutro Abs 3.7 1.7 - 7.7 K/uL   Lymphocytes Relative 39 12 - 46 %   Lymphs Abs 2.9 0.7 - 4.0 K/uL   Monocytes Relative 7 3 - 12 %   Monocytes Absolute 0.5 0.1 - 1.0 K/uL   Eosinophils Relative 3 0 - 5 %   Eosinophils Absolute 0.2 0.0 - 0.7 K/uL   Basophils Relative 1 0 - 1 %   Basophils Absolute 0.0 0.0 - 0.1 K/uL  Urinalysis, Routine w reflex microscopic (not at [facility])  Result Value Ref Range   Color, Urine YELLOW YELLOW   APPearance CLEAR CLEAR   Specific Gravity, Urine 1.023 1.005 - 1.030   pH 6.0 5.0 - 8.0   Glucose, UA NEGATIVE NEGATIVE mg/dL   Hgb urine dipstick NEGATIVE NEGATIVE   Bilirubin Urine SMALL (A) NEGATIVE   Ketones, ur NEGATIVE NEGATIVE mg/dL   Protein, ur NEGATIVE NEGATIVE mg/dL   Urobilinogen, UA 1.0 0.0 - 1.0 mg/dL   Nitrite NEGATIVE NEGATIVE   Leukocytes, UA NEGATIVE NEGATIVE  Urine rapid drug screen (hosp performed)  Result Value Ref Range   Opiates NONE DETECTED NONE DETECTED   Cocaine NONE DETECTED NONE DETECTED   Benzodiazepines NONE DETECTED NONE DETECTED   Amphetamines NONE DETECTED NONE DETECTED   Tetrahydrocannabinol NONE DETECTED NONE DETECTED   Barbiturates NONE DETECTED NONE DETECTED  Ethanol  Result Value Ref Range   Alcohol, Ethyl (B) <5 <5 mg/dL  Comprehensive metabolic panel  Result Value Ref Range   Sodium 138 135 - 145 mmol/L   Potassium 3.1 (L) 3.5 - 5.1 mmol/L   Chloride 104 101 - 111 mmol/L   CO2 25 22 - 32 mmol/L    Glucose, Bld 103 (H) 65 - 99 mg/dL   BUN 10 6 - 20 mg/dL   Creatinine, Ser 7.82 0.61 - 1.24 mg/dL   Calcium 8.9 8.9 - 95.6 mg/dL   Total Protein 5.8 (L) 6.5 - 8.1 g/dL   Albumin 3.5 3.5 - 5.0 g/dL   AST 15 15 - 41 U/L   ALT 26 17 - 63 U/L   Alkaline Phosphatase 59 38 - 126 U/L   Total Bilirubin 0.4 0.3 - 1.2 mg/dL   GFR calc non Af Amer >60 >60 mL/min   GFR calc Af Amer >60 >60 mL/min   Anion gap 9 5 - 15  POC CBG, ED  Result Value Ref Range   Glucose-Capillary 76 65 - 99 mg/dL   Ct Head Wo Contrast  03/13/2015   CLINICAL DATA:  24 year old male with altered mental status.  Stop  EXAM: CT HEAD WITHOUT CONTRAST  CT CERVICAL SPINE WITHOUT CONTRAST  TECHNIQUE: Multidetector CT imaging of the head and cervical spine was performed following the standard protocol without intravenous contrast. Multiplanar CT image reconstructions of the cervical spine were also  generated.  COMPARISON:  CT dated 03/06/2015  FINDINGS: CT HEAD FINDINGS  Stable mild diffuse brain atrophy and white matter hypodensities again noted which are advanced for the patient's age. A cavum vergae is incidentally seen. There is no intracranial hemorrhage. No mass effect or midline shift identified.  The visualized paranasal sinuses and mastoid air cells are well aerated. The calvarium is intact.  CT CERVICAL SPINE FINDINGS  There is no acute fracture or subluxation of the cervical spine.The intervertebral disc spaces are preserved.The odontoid and spinous processes are intact.There is normal anatomic alignment of the C1-C2 lateral masses. The visualized soft tissues appear unremarkable.  IMPRESSION: No acute intracranial pathology. Stable mild atrophy and chronic white matter disease advanced for the patient's age.  No acute cervical spine pathology.   Electronically Signed   By: Elgie Collard M.D.   On: 03/13/2015 01:28   Ct Head Wo Contrast  03/06/2015   CLINICAL DATA:  Fall, head trauma  EXAM: CT HEAD WITHOUT CONTRAST  CT  CERVICAL SPINE WITHOUT CONTRAST  TECHNIQUE: Multidetector CT imaging of the head and cervical spine was performed following the standard protocol without intravenous contrast. Multiplanar CT image reconstructions of the cervical spine were also generated.  COMPARISON:  Head CT 01/18/2014, cervical spine CT 07/03/2011  FINDINGS: CT HEAD FINDINGS  Cavum vergae incidentally noted. No acute hemorrhage, infarct, or mass lesion is identified. Periventricular white matter hypodensity reidentified. Nasal bone deformity reidentified. No acute skull fracture. Paranasal sinuses are clear.  CT CERVICAL SPINE FINDINGS  Extensive streak artifact/noise from patient body habitus. Loss of the normal cervical lordosis may be positional. C1 through the cervicothoracic junction is visualized in its entirety. No precervical soft tissue widening. No fracture or dislocation.  IMPRESSION: No acute intracranial finding.  Chronic findings as above.  No acute cervical spine fracture.   Electronically Signed   By: Christiana Pellant M.D.   On: 03/06/2015 22:56   Ct Cervical Spine Wo Contrast  03/13/2015   CLINICAL DATA:  24 year old male with altered mental status.  Stop  EXAM: CT HEAD WITHOUT CONTRAST  CT CERVICAL SPINE WITHOUT CONTRAST  TECHNIQUE: Multidetector CT imaging of the head and cervical spine was performed following the standard protocol without intravenous contrast. Multiplanar CT image reconstructions of the cervical spine were also generated.  COMPARISON:  CT dated 03/06/2015  FINDINGS: CT HEAD FINDINGS  Stable mild diffuse brain atrophy and white matter hypodensities again noted which are advanced for the patient's age. A cavum vergae is incidentally seen. There is no intracranial hemorrhage. No mass effect or midline shift identified.  The visualized paranasal sinuses and mastoid air cells are well aerated. The calvarium is intact.  CT CERVICAL SPINE FINDINGS  There is no acute fracture or subluxation of the cervical spine.The  intervertebral disc spaces are preserved.The odontoid and spinous processes are intact.There is normal anatomic alignment of the C1-C2 lateral masses. The visualized soft tissues appear unremarkable.  IMPRESSION: No acute intracranial pathology. Stable mild atrophy and chronic white matter disease advanced for the patient's age.  No acute cervical spine pathology.   Electronically Signed   By: Elgie Collard M.D.   On: 03/13/2015 01:28   Ct Cervical Spine Wo Contrast  03/06/2015   CLINICAL DATA:  Fall, head trauma  EXAM: CT HEAD WITHOUT CONTRAST  CT CERVICAL SPINE WITHOUT CONTRAST  TECHNIQUE: Multidetector CT imaging of the head and cervical spine was performed following the standard protocol without intravenous contrast. Multiplanar CT image reconstructions of the cervical spine  were also generated.  COMPARISON:  Head CT 01/18/2014, cervical spine CT 07/03/2011  FINDINGS: CT HEAD FINDINGS  Cavum vergae incidentally noted. No acute hemorrhage, infarct, or mass lesion is identified. Periventricular white matter hypodensity reidentified. Nasal bone deformity reidentified. No acute skull fracture. Paranasal sinuses are clear.  CT CERVICAL SPINE FINDINGS  Extensive streak artifact/noise from patient body habitus. Loss of the normal cervical lordosis may be positional. C1 through the cervicothoracic junction is visualized in its entirety. No precervical soft tissue widening. No fracture or dislocation.  IMPRESSION: No acute intracranial finding.  Chronic findings as above.  No acute cervical spine fracture.   Electronically Signed   By: Christiana Pellant M.D.   On: 03/06/2015 22:56   Mr Brain Wo Contrast  02/15/2015   [facility]  NEUROIMAGING REPORT   STUDY DATE: 02/14/15 PATIENT NAME: Brandon [name] DOB: May 03, 1991 MRN: [id number]  ORDERING CLINICIAN: York Spaniel, MD  CLINICAL HISTORY: 24 year old male with multiple sclerosis on tysabri.  EXAM: MRI brain (without)  TECHNIQUE: MRI of the  brain with and without contrast was obtained  utilizing 5 mm axial slices with T1, T2, T2 flair, SWI and diffusion  weighted views.  T1 sagittal, T2 coronal and postcontrast views in the  axial and coronal plane were obtained. CONTRAST: no (IV access could not be obtained) IMAGING SITE: [facility] [address] (1.5 Tesla MRI)    FINDINGS:  Patient motion artifact noted.  No abnormal lesions are seen on diffusion-weighted views to suggest acute  ischemia. The cortical sulci, fissures and cisterns are normal in size and  appearance. Lateral, third and fourth ventricle are normal in size and  appearance. No extra-axial fluid collections are seen. No evidence of mass  effect or midline shift.    Moderate right greater than left subcortical and periventricular and  peri-callosal T2 hyperintensities.  On sagittal views the posterior fossa, pituitary gland and corpus callosum  are notable for moderate to severe atrophy of corpus callosum. No evidence  of intracranial hemorrhage on SWI views. The orbits and their contents,  paranasal sinuses and calvarium are unremarkable.  Intracranial flow voids  are present.    02/15/2015   Abnormal MRI brain (without) demonstrating: 1. Moderate right greater than left subcortical and periventricular and  peri-callosal chronic demyelinating plaques. 2. No acute findings. 3. Compared to MRI on 01/18/14, the acute right frontal lesions now appear  chronic. No definite new lesions noted.    INTERPRETING PHYSICIAN:  Suanne Marker, MD Certified in Neurology, Neurophysiology and Neuroimaging  [facility] [address] [place], [place] [address] [phone]     Patient monitored in ED until 6AM. Patient able to ambulate with assistance (normally requires walker at home). Patient conversing appropriately. Will discharge home, patient also has a safe ride home. Return precautions discussed. Patient is agreeable to plan. Patient is stable at  time of discharge.  1. Fall, initial encounter   2. Altered mental status      Francee Piccolo, PA-C 03/13/15 1942  Layla Maw Ward, DO 03/13/15 2308

## 2015-03-13 NOTE — ED Notes (Signed)
Upon entering the room to do a round the pt was observed with the torso  at the foot of the bed and feet touching the floor. Pt repositioned back in bed with assistance from other staff.

## 2015-03-13 NOTE — ED Notes (Signed)
Pt CBG is 76. Nurse notified

## 2015-03-15 ENCOUNTER — Emergency Department (HOSPITAL_COMMUNITY)
Admission: EM | Admit: 2015-03-15 | Discharge: 2015-03-15 | Discharge status: Against Medical Advice | Payer: Medicare Other | Attending: Emergency Medicine | Admitting: Emergency Medicine

## 2015-03-15 ENCOUNTER — Encounter (HOSPITAL_COMMUNITY): Payer: Self-pay

## 2015-03-15 DIAGNOSIS — Z720 Tobacco use: Secondary | ICD-10-CM | POA: Diagnosis not present

## 2015-03-15 DIAGNOSIS — F319 Bipolar disorder, unspecified: Secondary | ICD-10-CM | POA: Diagnosis not present

## 2015-03-15 DIAGNOSIS — I10 Essential (primary) hypertension: Secondary | ICD-10-CM | POA: Insufficient documentation

## 2015-03-15 DIAGNOSIS — G8929 Other chronic pain: Secondary | ICD-10-CM

## 2015-03-15 DIAGNOSIS — E669 Obesity, unspecified: Secondary | ICD-10-CM | POA: Insufficient documentation

## 2015-03-15 DIAGNOSIS — Z79899 Other long term (current) drug therapy: Secondary | ICD-10-CM | POA: Diagnosis not present

## 2015-03-15 DIAGNOSIS — F209 Schizophrenia, unspecified: Secondary | ICD-10-CM | POA: Diagnosis not present

## 2015-03-15 DIAGNOSIS — M545 Low back pain: Secondary | ICD-10-CM | POA: Diagnosis present

## 2015-03-15 DIAGNOSIS — M542 Cervicalgia: Secondary | ICD-10-CM | POA: Insufficient documentation

## 2015-03-15 HISTORY — DX: Other chronic pain: M54.9

## 2015-03-15 HISTORY — DX: Patient's noncompliance with other medical treatment and regimen due to unspecified reason: Z91.199

## 2015-03-15 HISTORY — DX: Cervicalgia: M54.2

## 2015-03-15 HISTORY — DX: Patient's noncompliance with other medical treatment and regimen: Z91.19

## 2015-03-15 HISTORY — DX: Other chronic pain: G89.29

## 2015-03-15 LAB — CBC WITH DIFFERENTIAL/PLATELET
Basophils Absolute: 0 10*3/uL (ref 0.0–0.1)
Basophils Relative: 1 % (ref 0–1)
Eosinophils Absolute: 0.3 10*3/uL (ref 0.0–0.7)
Eosinophils Relative: 3 % (ref 0–5)
HCT: 41.8 % (ref 39.0–52.0)
Hemoglobin: 13.3 g/dL (ref 13.0–17.0)
Lymphocytes Relative: 43 % (ref 12–46)
Lymphs Abs: 3.4 10*3/uL (ref 0.7–4.0)
MCH: 25.8 pg — ABNORMAL LOW (ref 26.0–34.0)
MCHC: 31.8 g/dL (ref 30.0–36.0)
MCV: 81 fL (ref 78.0–100.0)
Monocytes Absolute: 0.5 10*3/uL (ref 0.1–1.0)
Monocytes Relative: 6 % (ref 3–12)
Neutro Abs: 3.7 10*3/uL (ref 1.7–7.7)
Neutrophils Relative %: 47 % (ref 43–77)
Platelets: 161 10*3/uL (ref 150–400)
RBC: 5.16 MIL/uL (ref 4.22–5.81)
RDW: 15.4 % (ref 11.5–15.5)
WBC: 7.9 10*3/uL (ref 4.0–10.5)

## 2015-03-15 LAB — COMPREHENSIVE METABOLIC PANEL
ALT: 29 U/L (ref 17–63)
AST: 18 U/L (ref 15–41)
Albumin: 4.5 g/dL (ref 3.5–5.0)
Alkaline Phosphatase: 69 U/L (ref 38–126)
Anion gap: 7 (ref 5–15)
BUN: 11 mg/dL (ref 6–20)
CO2: 27 mmol/L (ref 22–32)
Calcium: 9.4 mg/dL (ref 8.9–10.3)
Chloride: 108 mmol/L (ref 101–111)
Creatinine, Ser: 1.12 mg/dL (ref 0.61–1.24)
GFR calc Af Amer: >60 mL/min (ref >60)
GFR calc non Af Amer: >60 mL/min (ref >60)
Glucose, Bld: 86 mg/dL (ref 65–99)
Potassium: 3.7 mmol/L (ref 3.5–5.1)
Sodium: 142 mmol/L (ref 135–145)
Total Bilirubin: 0.5 mg/dL (ref 0.3–1.2)
Total Protein: 7.4 g/dL (ref 6.5–8.1)

## 2015-03-15 LAB — ETHANOL: Alcohol, Ethyl (B): <5 mg/dL (ref <5)

## 2015-03-15 NOTE — ED Notes (Signed)
Bib ems c/o chronic back pain. Pt does not answer questions readily, PD called ems.

## 2015-03-15 NOTE — ED Notes (Signed)
Delay in lab draw, pt not in room 

## 2015-03-15 NOTE — ED Provider Notes (Signed)
CSN: [id number]     Arrival date & time 03/15/15  1544 History   First MD Initiated Contact with Patient 03/15/15 1626     Chief Complaint  Patient presents with  . Back Pain      HPI Pt was seen at 1645. Per pt, c/o gradual onset and persistence of constant acute flair of his chronic neck and low back "pain" for the past weeks.  Denies any change in his usual chronic pain pattern.  Pain worsens with palpation of the area and body position changes. Denies incont/retention of bowel or bladder, no saddle anesthesia, no focal motor weakness, no tingling/numbness in extremities, no fevers, no new injury, no abd pain.  The symptoms have been associated with no other complaints. The patient has a significant history of similar symptoms previously, recently being evaluated for this complaint and multiple prior evals for same.    Past Medical History  Diagnosis Date  . ADHD (attention deficit hyperactivity disorder)   . Hypertension   . Schizophrenia   . Bipolar 1 disorder   . 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 Obesity   . Multiple sclerosis 05/20/2013    left sided weakness, dysarthria  . Chronic back pain   . Non-compliance   . Chronic neck pain    Past Surgical History  Procedure Laterality Date  . None     Family History  Problem Relation Age of Onset  . Diabetes Mother   . ADD / ADHD Brother    Social History  Substance Use Topics  . Smoking status: Current Every Day Smoker -- 0.25 packs/day    Types: Cigarettes  . Smokeless tobacco: Never Used  . Alcohol Use: No     Comment: sometimes    Review of Systems ROS: Statement: All systems negative except as marked or noted in the HPI; Constitutional: Negative for fever and chills. ; ; Eyes: Negative for eye pain, redness and discharge. ; ; ENMT: Negative for ear pain, hoarseness, nasal congestion, sinus pressure and sore throat. ; ; Cardiovascular: Negative for chest pain, palpitations, diaphoresis, dyspnea and  peripheral edema. ; ; Respiratory: Negative for cough, wheezing and stridor. ; ; Gastrointestinal: Negative for nausea, vomiting, diarrhea, abdominal pain, blood in stool, hematemesis, jaundice and rectal bleeding. . ; ; Genitourinary: Negative for dysuria, flank pain and hematuria. ; ; Musculoskeletal: +back pain and neck pain. Negative for swelling and trauma.; ; Skin: Negative for pruritus, rash, abrasions, blisters, bruising and skin lesion.; ; Neuro: Negative for headache, lightheadedness and neck stiffness. Negative for weakness, altered level of consciousness , altered mental status, extremity weakness, paresthesias, involuntary movement, seizure and syncope.      Allergies  Review of patient's allergies indicates no known allergies.  Home Medications   Prior to Admission medications   Medication Sig Start Date End Date Taking? Authorizing Provider  amLODipine (NORVASC) 10 MG tablet Take 10 mg by mouth daily.  11/30/14  Yes Historical Provider, MD  ARIPiprazole 400 MG SUSR Inject 400 mg into the muscle every 28 (twenty-eight) days.    Yes Historical Provider, MD  DOCUSATE SODIUM PO Take 100 mg by mouth at bedtime.    Yes Historical Provider, MD  Melatonin 5 MG TABS Take 2 tablets by mouth at bedtime.    Yes Historical Provider, MD  traZODone (DESYREL) 100 MG tablet Take 200 mg by mouth at bedtime.  12/05/14  Yes Historical Provider, MD  Vitamin D, Ergocalciferol, (DRISDOL) 50000 UNITS CAPS capsule Take 50,000  Units by mouth every 7 (seven) days.    Historical Provider, MD   BP 142/62 mmHg  Pulse 67  Temp(Src) 98.6 F (37 C) (Oral)  Resp 20  Wt 293 lb (132.904 kg)  SpO2 100% Physical Exam  1650: Physical examination:  Nursing notes reviewed; Vital signs and O2 SAT reviewed;  Constitutional: Well developed, Well nourished, Well hydrated, In no acute distress; Head:  Normocephalic, atraumatic; Eyes: EOMI, PERRL, No scleral icterus; ENMT: Mouth and pharynx normal, Mucous membranes moist;  Neck: Supple, Full range of motion, No lymphadenopathy; Cardiovascular: Regular rate and rhythm, No gallop; Respiratory: Breath sounds clear & equal bilaterally, No wheezes.  Speaking full sentences with ease, Normal respiratory effort/excursion; Chest: Nontender, Movement normal; Abdomen: Soft, Nontender, Nondistended, Normal bowel sounds; Genitourinary: No CVA tenderness; Spine:  No midline CS, TS, LS tenderness. +mild TTP bilat cervical and lumbar paraspinal muscles.;; Extremities: Pulses normal, No tenderness, No edema, No calf edema or asymmetry.; Neuro: Awake, eyes open, slowed speech. Will fall asleep while talking.  Major CN grossly intact. Left sided weakness per hx, otherwise no new gross focal motor deficits.; Skin: Color normal, Warm, Dry.   ED Course  Procedures (including critical care time) Labs Review   Imaging Review  I have personally reviewed and evaluated these images and lab results as part of my medical decision-making.   EKG Interpretation None      MDM  MDM Reviewed: previous chart, nursing note and vitals Reviewed previous: labs     1655:  Pt c/o chronic pain, pt not readily answering questions and will fall asleep while talking. Pt was here 3 days ago for same, admitted to taking ecstacy at that time. Will not admit to any drug usage to my questioning. Will not dose narcotics or meds which may further exacerbate AMS. Workup ordered.   1735:  Pt found in bathroom masturbating. Now states he wants to leave, his ride is here. Pt left the ED stable.   Samuel Jester, DO 03/18/15 1057

## 2015-03-15 NOTE — ED Notes (Signed)
Pt was ask to give a urine sample twice but when staff checked on him in the bathroom he was masturbating Rn notified

## 2015-03-20 ENCOUNTER — Telehealth: Payer: Self-pay | Admitting: Neurology

## 2015-03-20 NOTE — Telephone Encounter (Signed)
Regina from [employer] called about pt Tysabri. She needs the Transport planner. Just needs to validate status of the form . Rene Kocher is re-faxing form. [phone], ext [phone]

## 2015-03-22 NOTE — Telephone Encounter (Signed)
I called Brandon [name]. I have not received the reauthorization form and wanted to ask her to resend it. She stated that she received it from Dr. Anne Hahn yesterday and will send the renewal to me for my records. She does not need anything else from our office at this time.

## 2015-03-23 ENCOUNTER — Emergency Department (HOSPITAL_COMMUNITY)
Admission: EM | Admit: 2015-03-23 | Discharge: 2015-03-23 | Disposition: A | Discharge status: Home / Self Care | Payer: Medicare Other | Attending: Emergency Medicine | Admitting: Emergency Medicine

## 2015-03-23 ENCOUNTER — Emergency Department (HOSPITAL_COMMUNITY): Payer: Medicare Other

## 2015-03-23 ENCOUNTER — Encounter (HOSPITAL_COMMUNITY): Payer: Self-pay

## 2015-03-23 DIAGNOSIS — G8929 Other chronic pain: Secondary | ICD-10-CM | POA: Insufficient documentation

## 2015-03-23 DIAGNOSIS — Z720 Tobacco use: Secondary | ICD-10-CM | POA: Insufficient documentation

## 2015-03-23 DIAGNOSIS — F209 Schizophrenia, unspecified: Secondary | ICD-10-CM | POA: Diagnosis not present

## 2015-03-23 DIAGNOSIS — I10 Essential (primary) hypertension: Secondary | ICD-10-CM | POA: Insufficient documentation

## 2015-03-23 DIAGNOSIS — T40905A Adverse effect of unspecified psychodysleptics [hallucinogens], initial encounter: Secondary | ICD-10-CM | POA: Diagnosis present

## 2015-03-23 DIAGNOSIS — G35 Multiple sclerosis: Secondary | ICD-10-CM

## 2015-03-23 DIAGNOSIS — Z9119 Patient's noncompliance with other medical treatment and regimen: Secondary | ICD-10-CM | POA: Insufficient documentation

## 2015-03-23 DIAGNOSIS — F319 Bipolar disorder, unspecified: Secondary | ICD-10-CM | POA: Diagnosis not present

## 2015-03-23 DIAGNOSIS — E669 Obesity, unspecified: Secondary | ICD-10-CM | POA: Diagnosis not present

## 2015-03-23 DIAGNOSIS — F1910 Other psychoactive substance abuse, uncomplicated: Secondary | ICD-10-CM

## 2015-03-23 DIAGNOSIS — Z79899 Other long term (current) drug therapy: Secondary | ICD-10-CM | POA: Insufficient documentation

## 2015-03-23 DIAGNOSIS — R4182 Altered mental status, unspecified: Secondary | ICD-10-CM

## 2015-03-23 LAB — RAPID URINE DRUG SCREEN, HOSP PERFORMED
Amphetamines: NOT DETECTED
Barbiturates: NOT DETECTED
Benzodiazepines: NOT DETECTED
Cocaine: NOT DETECTED
Opiates: NOT DETECTED
Tetrahydrocannabinol: NOT DETECTED

## 2015-03-23 LAB — COMPREHENSIVE METABOLIC PANEL
ALT: 23 U/L (ref 17–63)
AST: 14 U/L — ABNORMAL LOW (ref 15–41)
Albumin: 3.9 g/dL (ref 3.5–5.0)
Alkaline Phosphatase: 61 U/L (ref 38–126)
Anion gap: 9 (ref 5–15)
BUN: 12 mg/dL (ref 6–20)
CO2: 24 mmol/L (ref 22–32)
Calcium: 9.3 mg/dL (ref 8.9–10.3)
Chloride: 106 mmol/L (ref 101–111)
Creatinine, Ser: 1.09 mg/dL (ref 0.61–1.24)
GFR calc Af Amer: >60 mL/min (ref >60)
GFR calc non Af Amer: >60 mL/min (ref >60)
Glucose, Bld: 91 mg/dL (ref 65–99)
Potassium: 3.5 mmol/L (ref 3.5–5.1)
Sodium: 139 mmol/L (ref 135–145)
Total Bilirubin: 0.6 mg/dL (ref 0.3–1.2)
Total Protein: 6.3 g/dL — ABNORMAL LOW (ref 6.5–8.1)

## 2015-03-23 LAB — CBC WITH DIFFERENTIAL/PLATELET
Basophils Absolute: 0.1 10*3/uL (ref 0.0–0.1)
Basophils Relative: 1 %
Eosinophils Absolute: 0.3 10*3/uL (ref 0.0–0.7)
Eosinophils Relative: 4 %
HCT: 39.9 % (ref 39.0–52.0)
Hemoglobin: 13 g/dL (ref 13.0–17.0)
Lymphocytes Relative: 36 %
Lymphs Abs: 2.4 10*3/uL (ref 0.7–4.0)
MCH: 25.9 pg — ABNORMAL LOW (ref 26.0–34.0)
MCHC: 32.6 g/dL (ref 30.0–36.0)
MCV: 79.5 fL (ref 78.0–100.0)
Monocytes Absolute: 0.5 10*3/uL (ref 0.1–1.0)
Monocytes Relative: 8 %
Neutro Abs: 3.3 10*3/uL (ref 1.7–7.7)
Neutrophils Relative %: 51 %
Platelets: 178 10*3/uL (ref 150–400)
RBC: 5.02 MIL/uL (ref 4.22–5.81)
RDW: 15.6 % — ABNORMAL HIGH (ref 11.5–15.5)
WBC: 6.6 10*3/uL (ref 4.0–10.5)

## 2015-03-23 LAB — SALICYLATE LEVEL: Salicylate Lvl: <4 mg/dL (ref 2.8–30.0)

## 2015-03-23 LAB — ACETAMINOPHEN LEVEL: Acetaminophen (Tylenol), Serum: <10 ug/mL — ABNORMAL LOW (ref 10–30)

## 2015-03-23 LAB — ETHANOL: Alcohol, Ethyl (B): <5 mg/dL (ref <5)

## 2015-03-23 MED ORDER — ZIPRASIDONE MESYLATE 20 MG IM SOLR
15.0000 mg | Freq: Once | INTRAMUSCULAR | Status: AC
  Administered 2015-03-23: 15 mg via INTRAMUSCULAR
  Filled 2015-03-23: qty 20

## 2015-03-23 MED ORDER — LORAZEPAM 2 MG/ML IJ SOLN
2.0000 mg | Freq: Once | INTRAMUSCULAR | Status: AC
  Administered 2015-03-23: 2 mg via INTRAVENOUS
  Filled 2015-03-23: qty 1

## 2015-03-23 MED ORDER — STERILE WATER FOR INJECTION IJ SOLN
INTRAMUSCULAR | Status: AC
  Filled 2015-03-23: qty 10

## 2015-03-23 MED ORDER — GADOBENATE DIMEGLUMINE 529 MG/ML IV SOLN
20.0000 mL | Freq: Once | INTRAVENOUS | Status: AC | PRN
  Administered 2015-03-23: 20 mL via INTRAVENOUS

## 2015-03-23 NOTE — ED Notes (Signed)
Pt states that he took ecstasy of an unknown amount, he only smiles when he ask him if he has drank.

## 2015-03-23 NOTE — ED Notes (Signed)
Reported bp to Dr. Ranae Palms, [phone]. md acknowledgs, no new orders. Patient attempting to void for urine collection.

## 2015-03-23 NOTE — ED Notes (Signed)
Pt comes from [facility] lot [facility] [employer] via [facility], pt was falling over and unable to stand, questionable overdose, pt overdosed last week on ecstasy.

## 2015-03-23 NOTE — ED Notes (Signed)
Called to get update of MRI for pt, estimated time 45 mins. Notified EDP.

## 2015-03-23 NOTE — ED Provider Notes (Signed)
Patient was accepted at signout from Dr. Hipolito Bayley. He had been seen earlier by Dr. Ranae Palms with a suspected ecstasy overdose. He had been found having significantly bizarre behavior. During the course of Dr. Denny Levy supervision the patient, neurology was consults it and an MRI was ordered to assess for changes or worsening of underlying MS. Plan was for review of MRI results and if negative for acute changes and the patient back to normal mental status, he would be appropriate for discharge.  At this point the MRI does not show any acute findings related to the patient's MS. The patient is now awake and requesting food. He has also been masturbating in the room. His mental status is now awake and alert. He answers questions appropriately for me. At this time he is stable for discharge. Patient has comorbid problems of multiple sclerosis, schizophrenia and drug abuse.  Arby Barrette, MD 03/23/15 [phone]

## 2015-03-23 NOTE — Discharge Instructions (Signed)
Chemical Dependency Chemical dependency is an addiction to drugs or alcohol. It is characterized by the repeated behavior of seeking out and using drugs and alcohol despite harmful consequences to the health and safety of ones self and others.  RISK FACTORS There are certain situations or behaviors that increase a person's risk for chemical dependency. These include:  A family history of chemical dependency.  A history of mental health issues, including depression and anxiety.  A home environment where drugs and alcohol are easily available to you.  Drug or alcohol use at a young age. SYMPTOMS  The following symptoms can indicate chemical dependency:  Inability to limit the use of drugs or alcohol.  Nausea, sweating, shakiness, and anxiety that occurs when alcohol or drugs are not being used.  An increase in amount of drugs or alcohol that is necessary to get drunk or high. People who experience these symptoms can assess their use of drugs and alcohol by asking themselves the following questions:  Have you been told by friends or family that they are worried about your use of alcohol or drugs?  Do friends and family ever tell you about things you did while drinking alcohol or using drugs that you do not remember?  Do you lie about using alcohol or drugs or about the amounts you use?  Do you have difficulty completing daily tasks unless you use alcohol or drugs?  Is the level of your work or school performance lower because of your drug or alcohol use?  Do you get sick from using drugs or alcohol but keep using anyway?  Do you feel uncomfortable in social situations unless you use alcohol or drugs?  Do you use drugs or alcohol to help forget problems? An answer of yes to any of these questions may indicate chemical dependency. Professional evaluation is suggested. Document Released: 06/17/2001 Document Revised: 09/15/2011 Document Reviewed: 08/29/2010 [facility] Patient  Information 2015 [place], [place]. This information is not intended to replace advice given to you by your health care provider. Make sure you discuss any questions you have with your health care provider. Schizophrenia Schizophrenia is a mental illness. It may cause disturbed or disorganized thinking, speech, or behavior. People with schizophrenia have problems functioning in one or more areas of life: work, school, home, or relationships. People with schizophrenia are at increased risk for suicide, certain chronic physical illnesses, and unhealthy behaviors, such as smoking and drug use. People who have family members with schizophrenia are at higher risk of developing the illness. Schizophrenia affects men and women equally but usually appears at an earlier age (teenage or early adult years) in men.  SYMPTOMS The earliest symptoms are often subtle (prodrome) and may go unnoticed until the illness becomes more severe (first-break psychosis). Symptoms of schizophrenia may be continuous or may come and go in severity. Episodes often are triggered by major life events, such as family stress, college, [employer], marriage, pregnancy or child birth, divorce, or loss of a loved one. People with schizophrenia may see, hear, or feel things that do not exist (hallucinations). They may have false beliefs in spite of obvious proof to the contrary (delusions). Sometimes speech is incoherent or behavior is odd or withdrawn.  DIAGNOSIS Schizophrenia is diagnosed through an assessment by your caregiver. Your caregiver will ask questions about your thoughts, behavior, mood, and ability to function in daily life. Your caregiver may ask questions about your medical history and use of alcohol or drugs, including prescription medication. Your caregiver may also  order blood tests and imaging exams. Certain medical conditions and substances can cause symptoms that resemble schizophrenia. Your caregiver may refer you to a  mental health specialist for evaluation. There are three major criterion for a diagnosis of schizophrenia:  Two or more of the following five symptoms are present for a month or longer:  Delusions. Often the delusions are that you are being attacked, harassed, cheated, persecuted or conspired against (persecutory delusions).  Hallucinations.   Disorganized speech that does not make sense to others.  Grossly 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confused or unfocused) behavior or extremely overactive or underactive motor activity (catatonia).  Negative symptoms such as bland or blunted emotions (flat affect), loss of will power (avolition), and withdrawal from social contacts (social isolation).  Level of functioning in one or more major areas of life (work, school, relationships, or self-care) is markedly below the level of functioning before the onset of illness.   There are continuous signs of illness (either mild symptoms or decreased level of functioning) for at least 6 months or longer. TREATMENT  Schizophrenia is a long-term illness. It is best controlled with continuous treatment rather than treatment only when symptoms occur. The following treatments are used to manage schizophrenia:  Medication--Medication is the most effective and important form of treatment for schizophrenia. Antipsychotic medications are usually prescribed to help manage schizophrenia. Other types of medication may be added to relieve any symptoms that may occur despite the use of antipsychotic medications.  Counseling or talk therapy--Individual, group, or family counseling may be helpful in providing education, support, and guidance. Many people with schizophrenia also benefit from social skills and job skills (vocational) training. A combination of medication and counseling is best for managing the disorder over time. A procedure in which electricity is applied to the brain through the scalp (electroconvulsive therapy) may be used to  treat catatonic schizophrenia or schizophrenia in people who cannot take or do not respond to medication and counseling. Document Released: 06/20/2000 Document Revised: 02/23/2013 Document Reviewed: 09/15/2012 [facility] Patient Information 2015 [place], [place]. This information is not intended to replace advice given to you by your health care provider. Make sure you discuss any questions you have with your health care provider. Multiple Sclerosis Multiple sclerosis (MS) is a disease of the central nervous system. It leads to the loss of the insulating covering of the nerves (myelin sheath) of your brain. When this happens, brain signals do not get sent properly or may not get sent at all. The age of onset of MS varies.  CAUSES The cause of MS is unknown. However, it is more common in the [place] than in the [place]. RISK FACTORS There is a higher number of women with MS than men. MS is not an illness that is passed down to you from your family members (inherited). However, your risk of MS is higher if you have a relative with MS. SIGNS AND SYMPTOMS  The symptoms of MS occur in episodes or attacks. These attacks may last weeks to months. There may be long periods of almost no symptoms between attacks. The symptoms of MS vary. This is because of the many different ways it affects the central nervous system. The main symptoms of MS include:  Vision problems and eye pain.  Numbness.  Weakness.  Inability to move your arms, hands, feet, or legs (paralysis).  Balance problems.  Tremors. DIAGNOSIS  Your health care provider can diagnose MS with the help of imaging exams and lab tests. These may  include specialized X-ray exams and spinal fluid tests. The best imaging exam to confirm a diagnosis of MS is an MRI. TREATMENT  There is no known cure for MS, but there are medicines that can decrease the number and frequency of attacks. Steroids are often used for short-term  relief. Physical and occupational therapy may also help. There are also many new alternative or complementary treatments available to help control the symptoms of MS. Ask your health care provider if any of these other options are right for you. HOME CARE INSTRUCTIONS   Take medicines as directed by your health care provider.  Exercise as directed by your health care provider. SEEK MEDICAL CARE IF: You begin to feel depressed. SEEK IMMEDIATE MEDICAL CARE IF:  You develop paralysis.  You have problems with bladder, bowel, or sexual function.  You develop mental changes, such as forgetfulness or mood swings.  You have a period of uncontrolled movements (seizure). Document Released: 06/20/2000 Document Revised: 06/28/2013 Document Reviewed: 02/28/2013 [facility] Patient Information 2015 [place], [place]. This information is not intended to replace advice given to you by your health care provider. Make sure you discuss any questions you have with your health care provider.

## 2015-03-23 NOTE — ED Provider Notes (Signed)
CSN: [id number]     Arrival date & time 03/23/15  0434 History   First MD Initiated Contact with Patient 03/23/15 [phone]     Chief Complaint  Patient presents with  . Drug Overdose     (Consider location/radiation/quality/duration/timing/severity/associated sxs/prior Treatment) HPI Low 5 caveat applies. Presents by EMS for suspected drug intoxication. Patient reportedly using ecstasy last week. Having difficulty ambulating in the parking lot of a convenient store. EMS called. Vital signs stable in route. Past Medical History  Diagnosis Date  . ADHD (attention deficit hyperactivity disorder)   . Hypertension   . Schizophrenia   . Bipolar 1 disorder   . 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 Obesity   . Multiple sclerosis 05/20/2013    left sided weakness, dysarthria  . Chronic back pain   . Non-compliance   . Chronic neck pain    Past Surgical History  Procedure Laterality Date  . None     Family History  Problem Relation Age of Onset  . Diabetes Mother   . ADD / ADHD Brother    Social History  Substance Use Topics  . Smoking status: Current Every Day Smoker -- 0.25 packs/day    Types: Cigarettes  . Smokeless tobacco: Never Used  . Alcohol Use: No     Comment: sometimes    Review of Systems  Unable to perform ROS     Allergies  Review of patient's allergies indicates no known allergies.  Home Medications   Prior to Admission medications   Medication Sig Start Date End Date Taking? Authorizing Meleny Tregoning  amLODipine (NORVASC) 10 MG tablet Take 10 mg by mouth daily.  11/30/14   Historical Johney Perotti, MD  ARIPiprazole 400 MG SUSR Inject 400 mg into the muscle every 28 (twenty-eight) days.     Historical Annalie Wenner, MD  DOCUSATE SODIUM PO Take 100 mg by mouth at bedtime.     Historical Sharicka Pogorzelski, MD  Melatonin 5 MG TABS Take 2 tablets by mouth at bedtime.     Historical Jameia Makris, MD  traZODone (DESYREL) 100 MG tablet Take 200 mg by mouth at bedtime.   12/05/14   Historical Bawi Lakins, MD  Vitamin D, Ergocalciferol, (DRISDOL) 50000 UNITS CAPS capsule Take 50,000 Units by mouth every 7 (seven) days.    Historical Rhianna Raulerson, MD   BP 126/57 mmHg  Pulse 55  Temp(Src) 98.4 F (36.9 C) (Oral)  Resp 18  Ht 5\' 10"  (1.778 m)  Wt 293 lb (132.904 kg)  BMI 42.04 kg/m2  SpO2 100% Physical Exam  Constitutional: He appears well-developed and well-nourished. No distress.  HENT:  Head: Normocephalic and atraumatic.  Mouth/Throat: Oropharynx is clear and moist.  Eyes: EOM are normal. Pupils are equal, round, and reactive to light.  Neck: Normal range of motion. Neck supple.  Cardiovascular: Normal rate and regular rhythm.   Pulmonary/Chest: Effort normal and breath sounds normal. No respiratory distress. He has no wheezes. He has no rales. He exhibits no tenderness.  Abdominal: Soft. Bowel sounds are normal.  Musculoskeletal: Normal range of motion. He exhibits no edema or tenderness.  Neurological: He is alert.  Patient following simple commands. Moving all extremities.  Skin: Skin is warm and dry. No rash noted. No erythema.  Psychiatric:  Patient is awake and smiling.  Nursing note and vitals reviewed.   ED Course  Procedures (including critical care time) Labs Review Labs Reviewed  CBC WITH DIFFERENTIAL/PLATELET - Abnormal; Notable for the following:    MCH 25.9 (*)  RDW 15.6 (*)    All other components within normal limits  COMPREHENSIVE METABOLIC PANEL - Abnormal; Notable for the following:    Total Protein 6.3 (*)    AST 14 (*)    All other components within normal limits  ACETAMINOPHEN LEVEL - Abnormal; Notable for the following:    Acetaminophen (Tylenol), Serum <10 (*)    All other components within normal limits  ETHANOL  URINE RAPID DRUG SCREEN, HOSP PERFORMED  SALICYLATE LEVEL    Imaging Review Mr Lodema Pilot Contrast  03/23/2015   CLINICAL DATA:  Multiple sclerosis and psychiatric/drug history found wandering and  confused.  EXAM: MRI HEAD WITHOUT AND WITH CONTRAST  TECHNIQUE: Multiplanar, multiecho pulse sequences of the brain and surrounding structures were obtained without and with intravenous contrast.  CONTRAST:  20mL MULTIHANCE GADOBENATE DIMEGLUMINE 529 MG/ML IV SOLN  COMPARISON:  02/14/2015  FINDINGS: Calvarium and upper cervical spine: No focal marrow signal abnormality.  Marked enlargement of the adenoid tonsil which is chronic and with multiple retention cysts, most consistent with adenoidal hypertrophy.  Orbits: No significant findings.  Sinuses and Mastoids: Clear.  Brain: Typical multiple sclerosis pattern with infratentorial, periventricular, callosal septal, juxtacortical white matter plaques and corpus callosum atrophy. When allowing for motion on the comparison study there has been no demonstrable change. No indication of active demyelination. No acute infarct, hemorrhage, hydrocephalus, mass lesion, or major vessel occlusion. MS treatment is uncertain, no evidence of superimposed opportunistic infection.  IMPRESSION: 1. No acute finding. 2. Multiple sclerosis with no evidence of active demyelination.   Electronically Signed   By: Marnee Spring M.D.   On: 03/23/2015 16:23   I have personally reviewed and evaluated these images and lab results as part of my medical decision-making.   EKG Interpretation None      MDM   Final diagnoses:  Altered mental status, unspecified altered mental status type  Drug abuse  Schizophrenia, unspecified type  Multiple sclerosis   Signed out to oncoming emergency physician pending sobriety.     Loren Racer, MD 03/25/15 [phone]

## 2015-03-23 NOTE — ED Provider Notes (Signed)
Patient received from Dr. Ranae Palms and check out this a.m. at 07 15. He reports that patient was thought to have overdosed likely on ecstasy. He has been resting in the room with occasional outbursts. He was found in a parking lot and was behaving in the bizarre manner. Patient has recently been seen here for similar symptoms at that time it was reported that he had used ecstasy.  Patient is staring intermittently and occasionally yells out and appears to be somewhat aggressive. Nursing reported that he was masturbating in his room.   Review of his records reveal that he has multiple sclerosis and reportedly uses a hospital bed at home. He reports that he lives with his mother and brother. We will attempt to contact family. Plan Ativan and Geodon and will continue to collect urine.  Patient's urine drug screen today and on 9/5 when he was last seen for similar behavior are negative. Review of his records from before reveals that he has severe progressive multiple sclerosis. However, despite noncompliance with medical regimen, this has apparently greatly improved.  Given patient's history of schizophrenia, drug use, and multiple sclerosis, plan neuro consult. If neurology feels that he is stable and this is not an acute exacerbation of his multiple sclerosis, patient will be transferred to psych ED for further evaluation.  1:58 PM   Discussed with Dr. Roseanne Reno on call for neurology. He advises MRI and this is negative for patient to be treated for psychiatric abnormality. Patient sleeping opens eyes responding as he has previously. Awaiting MRI.  2:59 PM    3:42 PM Patient signed out to Dr. Clarice Pole.  Margarita Grizzle, MD 03/23/15 [phone]

## 2015-03-23 NOTE — ED Notes (Signed)
Pt. Laughing uncontrollably for no reason and no one in the room. When entering the room to ask pt questions, pt doesn't respond and just stares in a gaze with a smile.

## 2015-03-23 NOTE — ED Notes (Signed)
NAD at this time. Pt is stable and going home.  

## 2015-03-23 NOTE — ED Notes (Signed)
Patient was given a [place] sandwich and sprite.

## 2015-03-23 NOTE — ED Notes (Signed)
Pt returned from MRI. No pain, and went back to sleep

## 2015-03-23 NOTE — ED Notes (Signed)
Ordered pt carb modified tray for pt.

## 2015-03-23 NOTE — ED Notes (Signed)
Spoke with pt. Mother on phone. Mother states she will come to hospital. Pt. Mother states pt. Has outbursts at baseline and has hx of behavioral problems. Pt. Mother denies aggression at home. Mother denies pt. Hx of substance abuse problems, but states pt. Does get out of home frequently. Pt. Uses walker and has PT in the home for MS.

## 2015-03-23 NOTE — ED Notes (Signed)
RN and NT to pt. Room. RN using therapeutic communication with pt when he became violent and began swinging his arms at [employer]. [employer] officer to bedside. Pt. Calm and laying in bed at present. MD at bedside to evaluate and speak with pt.

## 2015-03-23 NOTE — ED Notes (Signed)
As I was making rounds patient had woke up from sleeping trying to pull off Blood pressure Cuff off, he had stated he had to use bathroom. I ask him if he could walk he stated yes. So I assisted him, as we was walking out room patient started walking fast the other way, like he was confused, so I called him and told him we had to come the other way. So once we got in bathroom, the patient was very confused like he didn't know what to do so I kept talking to him and had to tell him to sit on toilet he then turned around and sit down and security was standing outside door with me. When it was time for him to get off toilet I had to keep asking him to get up but he never did until Nurse Brett Canales assisted him. Brett Canales and I walked Beside Patient to his room while Security was walking with [place].

## 2015-03-26 ENCOUNTER — Encounter (HOSPITAL_COMMUNITY): Payer: Self-pay

## 2015-03-26 ENCOUNTER — Emergency Department (HOSPITAL_COMMUNITY)
Admission: EM | Admit: 2015-03-26 | Discharge: 2015-03-26 | Discharge status: Against Medical Advice | Payer: Medicare Other | Attending: Emergency Medicine | Admitting: Emergency Medicine

## 2015-03-26 DIAGNOSIS — G8929 Other chronic pain: Secondary | ICD-10-CM | POA: Insufficient documentation

## 2015-03-26 DIAGNOSIS — I10 Essential (primary) hypertension: Secondary | ICD-10-CM | POA: Diagnosis not present

## 2015-03-26 DIAGNOSIS — E669 Obesity, unspecified: Secondary | ICD-10-CM | POA: Diagnosis not present

## 2015-03-26 DIAGNOSIS — Z720 Tobacco use: Secondary | ICD-10-CM | POA: Diagnosis not present

## 2015-03-26 DIAGNOSIS — Z046 Encounter for general psychiatric examination, requested by authority: Secondary | ICD-10-CM | POA: Diagnosis not present

## 2015-03-26 NOTE — ED Notes (Signed)
[employer] to take pt home. Pt never verbalized SI or HI, [employer] simply states pts mother wanted pt here, however pt states he is ready to go home.

## 2015-03-26 NOTE — ED Notes (Signed)
Pt here voluntarily with [employer], they were called by pts mom to come get him and bring him here, pt isn't saying much about what he needs.

## 2015-03-27 ENCOUNTER — Emergency Department (HOSPITAL_COMMUNITY)
Admission: EM | Admit: 2015-03-27 | Discharge: 2015-04-02 | Disposition: A | Discharge status: Home / Self Care | Payer: Medicare Other | Attending: Emergency Medicine | Admitting: Emergency Medicine

## 2015-03-27 ENCOUNTER — Telehealth: Payer: Self-pay | Admitting: Neurology

## 2015-03-27 ENCOUNTER — Encounter (HOSPITAL_COMMUNITY): Payer: Self-pay | Admitting: Emergency Medicine

## 2015-03-27 DIAGNOSIS — F259 Schizoaffective disorder, unspecified: Secondary | ICD-10-CM | POA: Diagnosis present

## 2015-03-27 DIAGNOSIS — F1310 Sedative, hypnotic or anxiolytic abuse, uncomplicated: Secondary | ICD-10-CM | POA: Insufficient documentation

## 2015-03-27 DIAGNOSIS — G8929 Other chronic pain: Secondary | ICD-10-CM | POA: Insufficient documentation

## 2015-03-27 DIAGNOSIS — G35 Multiple sclerosis: Secondary | ICD-10-CM | POA: Diagnosis present

## 2015-03-27 DIAGNOSIS — R45850 Homicidal ideations: Secondary | ICD-10-CM

## 2015-03-27 DIAGNOSIS — Z9119 Patient's noncompliance with other medical treatment and regimen: Secondary | ICD-10-CM | POA: Diagnosis not present

## 2015-03-27 DIAGNOSIS — R443 Hallucinations, unspecified: Secondary | ICD-10-CM | POA: Diagnosis present

## 2015-03-27 DIAGNOSIS — Z720 Tobacco use: Secondary | ICD-10-CM | POA: Insufficient documentation

## 2015-03-27 DIAGNOSIS — I10 Essential (primary) hypertension: Secondary | ICD-10-CM | POA: Insufficient documentation

## 2015-03-27 DIAGNOSIS — E669 Obesity, unspecified: Secondary | ICD-10-CM | POA: Diagnosis not present

## 2015-03-27 LAB — CBC
HCT: 42.7 % (ref 39.0–52.0)
Hemoglobin: 13.2 g/dL (ref 13.0–17.0)
MCH: 25.2 pg — ABNORMAL LOW (ref 26.0–34.0)
MCHC: 30.9 g/dL (ref 30.0–36.0)
MCV: 81.5 fL (ref 78.0–100.0)
Platelets: 186 10*3/uL (ref 150–400)
RBC: 5.24 MIL/uL (ref 4.22–5.81)
RDW: 15.3 % (ref 11.5–15.5)
WBC: 7 10*3/uL (ref 4.0–10.5)

## 2015-03-27 LAB — COMPREHENSIVE METABOLIC PANEL
ALT: 32 U/L (ref 17–63)
AST: 17 U/L (ref 15–41)
Albumin: 4.5 g/dL (ref 3.5–5.0)
Alkaline Phosphatase: 73 U/L (ref 38–126)
Anion gap: 8 (ref 5–15)
BUN: 18 mg/dL (ref 6–20)
CO2: 28 mmol/L (ref 22–32)
Calcium: 9.3 mg/dL (ref 8.9–10.3)
Chloride: 104 mmol/L (ref 101–111)
Creatinine, Ser: 1.25 mg/dL — ABNORMAL HIGH (ref 0.61–1.24)
GFR calc Af Amer: >60 mL/min (ref >60)
GFR calc non Af Amer: >60 mL/min (ref >60)
Glucose, Bld: 84 mg/dL (ref 65–99)
Potassium: 4.6 mmol/L (ref 3.5–5.1)
Sodium: 140 mmol/L (ref 135–145)
Total Bilirubin: 0.6 mg/dL (ref 0.3–1.2)
Total Protein: 7.4 g/dL (ref 6.5–8.1)

## 2015-03-27 LAB — SALICYLATE LEVEL: Salicylate Lvl: <4 mg/dL (ref 2.8–30.0)

## 2015-03-27 LAB — ACETAMINOPHEN LEVEL: Acetaminophen (Tylenol), Serum: <10 ug/mL — ABNORMAL LOW (ref 10–30)

## 2015-03-27 LAB — ETHANOL: Alcohol, Ethyl (B): <5 mg/dL (ref <5)

## 2015-03-27 MED ORDER — IBUPROFEN 200 MG PO TABS: 600.0000 mg | ORAL_TABLET | Freq: Three times a day (TID) | ORAL | Status: DC | PRN

## 2015-03-27 MED ORDER — TRAZODONE HCL 100 MG PO TABS
200.0000 mg | ORAL_TABLET | Freq: Every day | ORAL | Status: DC
  Administered 2015-03-27: 200 mg via ORAL
  Filled 2015-03-27: qty 2

## 2015-03-27 MED ORDER — LORAZEPAM 2 MG/ML IJ SOLN
2.0000 mg | Freq: Once | INTRAMUSCULAR | Status: AC
Start: 2015-03-27 — End: 2015-03-27
  Administered 2015-03-27: 2 mg via INTRAMUSCULAR
  Filled 2015-03-27: qty 1

## 2015-03-27 MED ORDER — ZIPRASIDONE MESYLATE 20 MG IM SOLR
20.0000 mg | Freq: Once | INTRAMUSCULAR | Status: AC
  Administered 2015-03-27: 20 mg via INTRAMUSCULAR
  Filled 2015-03-27: qty 20

## 2015-03-27 MED ORDER — DIPHENHYDRAMINE HCL 50 MG/ML IJ SOLN
50.0000 mg | Freq: Once | INTRAMUSCULAR | Status: AC
Start: 2015-03-27 — End: 2015-03-27
  Administered 2015-03-27: 50 mg via INTRAMUSCULAR
  Filled 2015-03-27: qty 1

## 2015-03-27 MED ORDER — ACETAMINOPHEN 325 MG PO TABS: 650.0000 mg | ORAL_TABLET | ORAL | Status: DC | PRN

## 2015-03-27 MED ORDER — LORAZEPAM 1 MG PO TABS: 1.0000 mg | ORAL_TABLET | Freq: Three times a day (TID) | ORAL | Status: DC | PRN

## 2015-03-27 NOTE — ED Provider Notes (Signed)
[id number]     Arrival date & time 03/27/15  1910 History   First MD Initiated Contact with Patient 03/27/15 2006     Chief Complaint  Patient presents with  . IVC   . Hallucinations     (Consider location/radiation/quality/duration/timing/severity/associated sxs/prior Treatment) The history is provided by the patient.  Brandon [name] is a 24 y.o. male DHD, schizophrenia, bipolar, multiple sclerosis here presenting with agitation, suicidal ideation and hallucinations. Patient has not been taking his medications because "I am no longer schizophrenic". Patient has been hearing voices telling him to kill his mother. He has been agitated at home and has been throwing the TV. IVC by police. Patient also has some vague suicidal ideations as well. Of note patient was recently in the ER for possible ectasy overdose. He has she had an MRI that showed that there is no active demyelination.     Past Medical History  Diagnosis Date  . ADHD (attention deficit hyperactivity disorder)   . Hypertension   . Schizophrenia   . Bipolar 1 disorder   . 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 Obesity   . Multiple sclerosis 05/20/2013    left sided weakness, dysarthria  . Chronic back pain   . Non-compliance   . Chronic neck pain    Past Surgical History  Procedure Laterality Date  . None     Family History  Problem Relation Age of Onset  . Diabetes Mother   . ADD / ADHD Brother    Social History  Substance Use Topics  . Smoking status: Current Every Day Smoker -- 0.25 packs/day    Types: Cigarettes  . Smokeless tobacco: Never Used  . Alcohol Use: No     Comment: sometimes    Review of Systems  Psychiatric/Behavioral: Positive for hallucinations, behavioral problems and agitation.  All other systems reviewed and are negative.     Allergies  Review of patient's allergies indicates no known allergies.  Home Medications   Prior to Admission medications    Medication Sig Start Date End Date Taking? Authorizing Provider  ARIPiprazole 400 MG SUSR Inject 1 each into the muscle every 28 (twenty-eight) days.   Yes Historical Provider, MD  Melatonin 5 MG TABS Take 2 tablets by mouth at bedtime.    Yes Historical Provider, MD  traZODone (DESYREL) 100 MG tablet Take 200 mg by mouth at bedtime.   Yes Historical Provider, MD  Vitamin D, Ergocalciferol, (DRISDOL) 50000 UNITS CAPS capsule Take 50,000 Units by mouth every 7 (seven) days.   Yes Historical Provider, MD   BP 137/67 mmHg  Pulse 87  Temp(Src) 97.9 F (36.6 C) (Oral)  Resp 20  SpO2 96% Physical Exam  Constitutional: He is oriented to person, place, and time.  Refused to answer questions   HENT:  Head: Normocephalic.  Eyes: Conjunctivae are normal. Pupils are equal, round, and reactive to light.  Neck: Normal range of motion.  Cardiovascular: Normal rate, regular rhythm and normal heart sounds.   Pulmonary/Chest: Effort normal and breath sounds normal. No respiratory distress. He has no wheezes. He has no rales.  Abdominal: Soft. Bowel sounds are normal. He exhibits no distension. There is no tenderness.  Musculoskeletal: Normal range of motion.  Neurological: He is alert and oriented to person, place, and time.  Skin: Skin is warm and dry.  Psychiatric:  Depressed, agitated   Nursing note and vitals reviewed.   ED Course  Procedures (including critical care time) Labs  Review Labs Reviewed  COMPREHENSIVE METABOLIC PANEL - Abnormal; Notable for the following:    Creatinine, Ser 1.25 (*)    All other components within normal limits  ACETAMINOPHEN LEVEL - Abnormal; Notable for the following:    Acetaminophen (Tylenol), Serum <10 (*)    All other components within normal limits  CBC - Abnormal; Notable for the following:    MCH 25.2 (*)    All other components within normal limits  ETHANOL  SALICYLATE LEVEL  URINE RAPID DRUG SCREEN, HOSP PERFORMED    Imaging Review No  results found. I have personally reviewed and evaluated these images and lab results as part of my medical decision-making.   EKG Interpretation None      MDM   Final diagnoses:  None    Brandon [name] is a 24 y.o. male here with suicidal ideation, hallucinations. Likely psychotic. Labs at baseline. Medically cleared. Will consult TTS.    Richardean Canal, MD 03/27/15 [id number]

## 2015-03-27 NOTE — ED Notes (Signed)
Patient arrive to unit via ambulatory with a steady gait. NAD.

## 2015-03-27 NOTE — ED Notes (Signed)
IM injection given without resistance. Respirations equal and unlabored, skin warm and dry. NAD. Q 15 min safety checks remain in place.

## 2015-03-27 NOTE — ED Notes (Signed)
Pt will not answer questions for this writer saying, "Ain't much going on today. I would never hurt my mother." Goes from hysterically laughing to having a flat affect. A&Ox4. Pt is poor historian.

## 2015-03-27 NOTE — Telephone Encounter (Signed)
I called Kendra. Dr. Anne Hahn approved PT twice a week for 1 week and 3 times a week for 3 weeks. He also approved a Child psychotherapist consult. Enrique Sack stated the social worker would take care of the [facility] and send it over to be signed. I advised that if they need anything from our office or need [place] to fill out the FL2 to please let us know.

## 2015-03-27 NOTE — Telephone Encounter (Signed)
Kendra/[facility] [phone] called to request verbal orders for Home Health PT twice a week x 1 week, 3 times a week x 3 weeks. Enrique Sack states patient keeps falling. Also a verbal order for Child psychotherapist consult. [name] would like to see about placing patient in assisted living. It's too much for her to handle. Would like FL2 for this. If verbal order for Social Worker then Enrique Sack thinks the Child psychotherapist may be able to take care of FL2 if Dr. Anne Hahn is in agreement with this.

## 2015-03-27 NOTE — ED Notes (Signed)
Patient denies SI, HI and AVH at this time. Patient reports that he don't know why his mother had him brought back here. IVC paper states "He is danger to harm himself and or others, he is schizophrenia, hearing voices telling him to harm Shannan Harper) his mother. He is punching holes in the wall, throwing things, 2TV sets. He is not taking his meds. He has been committed in the past". Patient oriented to unit. Patient voices no complaints or concerns at this time. Encouragement and support provided and safety maintain. Q 15 min safety checks in place.

## 2015-03-27 NOTE — ED Notes (Signed)
Pt brought in by police-has IVC papers d/t the following reasons: "He is a danger to harm himself or others. He is schizophrenic, hearing voices telling him to harm (kill) his mother. He is punching holes in the wall, throwing things (has thrown 2 TV sets). He is not taking his medications. He has been committed in the past." [employer] at bedside. Pt has a very labile affect. No other c/c.

## 2015-03-27 NOTE — ED Notes (Signed)
Patient came out of restroom begin cursing and walked to double doors and started punching doors. Patient is unable to be re-directed at this time. Security, GP, [place], [facility] and [place], [place] informed of patient's behavior. New orders received and read back and verified. Encouragement and support provided and safety maintain. Q 15 min safety checks remain in place.

## 2015-03-28 DIAGNOSIS — F259 Schizoaffective disorder, unspecified: Secondary | ICD-10-CM | POA: Diagnosis not present

## 2015-03-28 MED ORDER — ARIPIPRAZOLE ER 400 MG IM SUSR
1.0000 | INTRAMUSCULAR | Status: DC
  Filled 2015-03-28 (×2): qty 400

## 2015-03-28 MED ORDER — LORAZEPAM 1 MG PO TABS
2.0000 mg | ORAL_TABLET | Freq: Once | ORAL | Status: AC
  Administered 2015-03-28: 2 mg via ORAL
  Filled 2015-03-28: qty 2

## 2015-03-28 MED ORDER — CHLORPROMAZINE HCL 25 MG/ML IJ SOLN
50.0000 mg | Freq: Once | INTRAMUSCULAR | Status: AC
  Administered 2015-03-28: 50 mg via INTRAMUSCULAR
  Filled 2015-03-28: qty 2

## 2015-03-28 MED ORDER — TRAZODONE HCL 100 MG PO TABS
200.0000 mg | ORAL_TABLET | Freq: Every evening | ORAL | Status: DC | PRN
  Administered 2015-03-28 – 2015-03-29 (×2): 200 mg via ORAL
  Filled 2015-03-28 (×2): qty 2

## 2015-03-28 MED ORDER — MAGNESIUM HYDROXIDE 400 MG/5ML PO SUSP: 30.0000 mL | Freq: Every day | ORAL | Status: DC | PRN

## 2015-03-28 MED ORDER — MELATONIN 5 MG PO TABS: 2.0000 | ORAL_TABLET | Freq: Every day | ORAL | Status: DC

## 2015-03-28 MED ORDER — DIPHENHYDRAMINE HCL 50 MG/ML IJ SOLN
50.0000 mg | Freq: Once | INTRAMUSCULAR | Status: AC
  Administered 2015-03-28: 50 mg via INTRAMUSCULAR
  Filled 2015-03-28: qty 1

## 2015-03-28 MED ORDER — VITAMIN D (ERGOCALCIFEROL) 1.25 MG (50000 UNIT) PO CAPS
50000.0000 [IU] | ORAL_CAPSULE | ORAL | Status: DC
  Administered 2015-03-28: 50000 [IU] via ORAL
  Filled 2015-03-28: qty 1

## 2015-03-28 MED ORDER — QUETIAPINE FUMARATE 100 MG PO TABS
100.0000 mg | ORAL_TABLET | Freq: Every day | ORAL | Status: DC
  Administered 2015-03-28 – 2015-03-29 (×2): 100 mg via ORAL
  Filled 2015-03-28 (×2): qty 1

## 2015-03-28 MED ORDER — OXCARBAZEPINE 300 MG PO TABS
600.0000 mg | ORAL_TABLET | Freq: Two times a day (BID) | ORAL | Status: DC
  Administered 2015-03-28 – 2015-04-02 (×9): 600 mg via ORAL
  Filled 2015-03-28 (×13): qty 2

## 2015-03-28 MED ORDER — ARIPIPRAZOLE ER 400 MG IM SUSR: 1.0000 | INTRAMUSCULAR | Status: DC

## 2015-03-28 NOTE — ED Notes (Signed)
Patient ate all of breakfast

## 2015-03-28 NOTE — [facility] Counselor (Signed)
Per Clint Bolder, [facility] and [place], [place], pt became agitated earlier and began to try to destroy property in the SAP[place].  As a result he was given a "cocktail" to help him calm down and sleep.  Assessment not possible at this time. Called and talked to Dr. Devoria Albe.  She agreed to remove TTS Consult until pt is awake and can be assessed.  Beryle Flock, MS, CRC, [facility] Triage Specialist [facility]

## 2015-03-28 NOTE — Progress Notes (Signed)
Patient referred for inpatient treatment at: [facility] - per Misty Stanley, 1 male bed, fax referral. [facility] - per Rodney Booze, either male or male bed open, fax it. [facility] - per intake, fax referral for review. [facility] - per Misty Stanley, fax referral for review.  At capacity: [facility] - per Parks Neptune  [facility]   [place], [place] Disposition staff 03/28/2015 9:55 PM

## 2015-03-28 NOTE — ED Notes (Addendum)
Pt is a 1:1 for safety.He is unsteady on his feet . He is easily directable. Pt continues put his hands in his pants and has to be instructed that that is not appropriate behavior. Pt does appear very lethargic. 11am-pt laughs inapropriately. He requested string cheese and gatorade. 11:30a-Pt is taking a shower. He continues to be incontinent of urine and smiles when he voids on the floor. Pt at times can be aggressive and jumps up stating,"I want to go home."pt stands at the mirror and pretends like he is boxing . Pt tried to run off the unit at 12:30p and security was made aware.pt was brought back onto the unit after being offered cheese sticks and sprite. 2:45p-Pt became agitated and stated he was going to leave. NP made aware and pt has had an additional 2 mg of ativan for extreme agitation. Pt is in his bed with both siderails up. Pt remains a 1:1 for safety. 6p-Pt had a large well formed BM . Presently he is back in the bed talking to himself. Pt did tell the writer that,'I only have ADHD and bipolar. I do not have schizophrenia anymore." Pt did have a short phone conversation with his mother.

## 2015-03-28 NOTE — ED Notes (Signed)
Patient had another episode urine incontinence while awoke. Complete linen change and scrub with assistance form Clinical research associate.

## 2015-03-28 NOTE — ED Notes (Signed)
Pt transferred to TCU room 36 with ED charge nurse and [facility] Tresa Endo, RN) approval due to urinary incontinence X 3 as reported from previous shift as well as unsteady gait and confusion. Pt observed leaning against walls in hall, hanging on door knobs when ambulating. However, no fall event noted thus far this shift. Pt is alert but confused, requires verbal redirections at intervals. Denies SI, HI, AVh and pain when assessed. Support, availability and encouragement offered to this pt.  Q 15 minutes checks maintained for safety.

## 2015-03-28 NOTE — Consult Note (Signed)
[facility] Face-to-Face Psychiatry Consult   Reason for Consult:  Schizoaffective disorder, unspecified type.,  Referring Physician:  EDP Patient Identification: Brandon [name] MRN:  [id number] Principal Diagnosis: Schizoaffective disorder, unspecified type Diagnosis:   Patient Active Problem List   Diagnosis Date Noted  . Schizoaffective disorder, unspecified type [F25.9]     Priority: High  . Hallucinations [R44.3] 08/05/2014  . Auditory hallucination [R44.0]   . Homicidal ideation [R45.850]   . Left-sided weakness [M62.89] 01/18/2014  . Multiple sclerosis [G35] 05/20/2013  . White matter abnormality on MRI of brain [R93.0] 11/23/2012  . Abnormality of gait [R26.9] 11/23/2012  . HTN (hypertension) [I10] 07/04/2011  . Ataxia [R27.0] 07/02/2011  . OBESITY [E66.9] 01/14/2010  . ADHD [F90.9] 01/14/2010    Total Time spent with patient: 1 hour  Subjective:   Brandon [name] is a 24 y.o. male patient admitted with Schizoaffective disorder, unspecified type.  HPI:  [name], 24 years old was evaluated for agitation, suicide ideation and hallucinations.    Patient stopped taking his Argenta medication stating that he no longer suffers from Schizophrenia or Schizoaffective disorder.  He informed his family members that he is hearing voices telling him to kill his mother.  Patient has been agitated  at home throwing the TV around.  Today patient admitted to using Ectasy and was brought to the ER for Ectasy  OD.  He did not disclose frequency and quantity.  He received his Abilify injection last week.  Patient lives with his family members who are supportive of him.   He was laughing inappropriately during the assessment and stated"  I am no longer Schizophrenic or Schizoaffective, it comes and goes and at this time I do not have it"   He reported poor sleep and stated that Trazodone is not enough for sleep and requested Seroquel.   He has been agitated and have been wanting to leave.  He was given  emergency medication per protocol this afternoon.  Marland Kitchen  He denies SI/HI today, he has been accepted for admission and we will be seeking placement at any facility with available inpatient  Psychiatric unit.   HPI Elements:   Location:  Schizoaffective disorder, unspecified type. Quality:  severe. Severity:  severe. Timing:  Acute. Duration:  Chronic mental illness. Context:  IVC for suicde ideation, Agitation and hallucinations..  Past Medical History:  Past Medical History  Diagnosis Date  . ADHD (attention deficit hyperactivity disorder)   . Hypertension   . Schizophrenia   . Bipolar 1 disorder   . 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 Obesity   . Multiple sclerosis 05/20/2013    left sided weakness, dysarthria  . Chronic back pain   . Non-compliance   . Chronic neck pain     Past Surgical History  Procedure Laterality Date  . None     Family History:  Family History  Problem Relation Age of Onset  . Diabetes Mother   . ADD / ADHD Brother    Social History:  History  Alcohol Use No    Comment: sometimes     History  Drug Use  . Yes  . Special: Marijuana    Comment: Pt denies    Social History   Social History  . Marital Status: Single    Spouse Name: N/A  . Number of Children: 0  . Years of Education: 11th   Occupational History  .      Disbaled   Social History  Main Topics  . Smoking status: Current Every Day Smoker -- 0.25 packs/day    Types: Cigarettes  . Smokeless tobacco: Never Used  . Alcohol Use: No     Comment: sometimes  . Drug Use: Yes    Special: Marijuana     Comment: Pt denies  . Sexual Activity: Not Asked   Other Topics Concern  . None   Social History Narrative   Patient lives at home with his mother.   Disabled.   Education 11 th grade .   Right handed.   Caffeine - one cup daily soda.   Additional Social History:    Pain Medications: See MAR Prescriptions: See MAR Over the Counter: See MAR History of  alcohol / drug use?: No history of alcohol / drug abuse                     Allergies:  No Known Allergies  Labs:  Results for orders placed or performed during the hospital encounter of 03/27/15 (from the past 48 hour(s))  Comprehensive metabolic panel     Status: Abnormal   Collection Time: 03/27/15  8:32 PM  Result Value Ref Range   Sodium 140 135 - 145 mmol/L   Potassium 4.6 3.5 - 5.1 mmol/L   Chloride 104 101 - 111 mmol/L   CO2 28 22 - 32 mmol/L   Glucose, Bld 84 65 - 99 mg/dL   BUN 18 6 - 20 mg/dL   Creatinine, Ser 1.25 (H) 0.61 - 1.24 mg/dL   Calcium 9.3 8.9 - 10.3 mg/dL   Total Protein 7.4 6.5 - 8.1 g/dL   Albumin 4.5 3.5 - 5.0 g/dL   AST 17 15 - 41 U/L   ALT 32 17 - 63 U/L   Alkaline Phosphatase 73 38 - 126 U/L   Total Bilirubin 0.6 0.3 - 1.2 mg/dL   GFR calc non Af Amer >60 >60 mL/min   GFR calc Af Amer >60 >60 mL/min    Comment: (NOTE) The eGFR has been calculated using the CKD EPI equation. This calculation has not been validated in all clinical situations. eGFR's persistently <60 mL/min signify possible Chronic Kidney Disease.    Anion gap 8 5 - 15  CBC     Status: Abnormal   Collection Time: 03/27/15  8:32 PM  Result Value Ref Range   WBC 7.0 4.0 - 10.5 K/uL   RBC 5.24 4.22 - 5.81 MIL/uL   Hemoglobin 13.2 13.0 - 17.0 g/dL   HCT 42.7 39.0 - 52.0 %   MCV 81.5 78.0 - 100.0 fL   MCH 25.2 (L) 26.0 - 34.0 pg   MCHC 30.9 30.0 - 36.0 g/dL   RDW 15.3 11.5 - 15.5 %   Platelets 186 150 - 400 K/uL  Ethanol (ETOH)     Status: None   Collection Time: 03/27/15  8:33 PM  Result Value Ref Range   Alcohol, Ethyl (B) <5 <5 mg/dL    Comment:        LOWEST DETECTABLE LIMIT FOR SERUM ALCOHOL IS 5 mg/dL FOR MEDICAL PURPOSES ONLY   Salicylate level     Status: None   Collection Time: 03/27/15  8:33 PM  Result Value Ref Range   Salicylate Lvl <9.5 2.8 - 30.0 mg/dL  Acetaminophen level     Status: Abnormal   Collection Time: 03/27/15  8:33 PM  Result Value Ref  Range   Acetaminophen (Tylenol), Serum <10 (L) 10 - 30 ug/mL  Comment:        THERAPEUTIC CONCENTRATIONS VARY SIGNIFICANTLY. A RANGE OF 10-30 ug/mL MAY BE AN EFFECTIVE CONCENTRATION FOR MANY PATIENTS. HOWEVER, SOME ARE BEST TREATED AT CONCENTRATIONS OUTSIDE THIS RANGE. ACETAMINOPHEN CONCENTRATIONS >150 ug/mL AT 4 HOURS AFTER INGESTION AND >50 ug/mL AT 12 HOURS AFTER INGESTION ARE OFTEN ASSOCIATED WITH TOXIC REACTIONS.     Vitals: Blood pressure 110/88, pulse 68, temperature 98 F (36.7 C), temperature source Oral, resp. rate 18, SpO2 100 %.  Risk to Self: Suicidal Ideation: Yes-Currently Present Suicidal Intent: Yes-Currently Present Is patient at risk for suicide?: No Suicidal Plan?: No Access to Means: No What has been your use of drugs/alcohol within the last 12 months?: Unknown How many times?: 2 Other Self Harm Risks: Fall Risk Triggers for Past Attempts: Family contact Intentional Self Injurious Behavior: None Risk to Others: Homicidal Ideation: Yes-Currently Present Thoughts of Harm to Others: Yes-Currently Present Comment - Thoughts of Harm to Others: Hearing voices to harm his mother Current Homicidal Intent: Yes-Currently Present Current Homicidal Plan: Yes-Currently Present Describe Current Homicidal Plan: Patient would not respond to assessment questions due to being psychotic. Access to Homicidal Means: Yes Describe Access to Homicidal Means: Patient was throwing a T.V. Identified Victim: Mother History of harm to others?: No Assessment of Violence: On admission Violent Behavior Description: Throwing property at his residence. Does patient have access to weapons?: No Criminal Charges Pending?: No Does patient have a court date: No Prior Inpatient Therapy: Prior Inpatient Therapy: Yes Prior Therapy Dates: Unknown Prior Therapy Facilty/Provider(s): Unknown Reason for Treatment: Psychosis Prior Outpatient Therapy: Prior Outpatient Therapy: No Prior  Therapy Dates: Unknown Prior Therapy Facilty/Provider(s): Unknown Reason for Treatment: MH issues Does patient have an ACCT team?: No Does patient have Intensive In-House Services?  : No Does patient have [facility] services? : No Does patient have P4CC services?: No  Current Facility-Administered Medications  Medication Dose Route Frequency Provider Last Rate Last Dose  . acetaminophen (TYLENOL) tablet 650 mg  650 mg Oral Q4H PRN Wandra Arthurs, MD      . Derrill Memo ON 03/29/2015] ARIPiprazole SUSR 400 mg  1 each Intramuscular Q28 days Mojeed Akintayo      . ibuprofen (ADVIL,MOTRIN) tablet 600 mg  600 mg Oral Q8H PRN Wandra Arthurs, MD      . magnesium hydroxide (MILK OF MAGNESIA) suspension 30 mL  30 mL Oral Daily PRN Mojeed Akintayo      . Oxcarbazepine (TRILEPTAL) tablet 600 mg  600 mg Oral BID Mojeed Akintayo   600 mg at 03/28/15 1311  . QUEtiapine (SEROQUEL) tablet 100 mg  100 mg Oral QHS Mojeed Akintayo      . traZODone (DESYREL) tablet 200 mg  200 mg Oral QHS PRN Mojeed Akintayo      . Vitamin D (Ergocalciferol) (DRISDOL) capsule 50,000 Units  50,000 Units Oral Q7 days Mojeed Akintayo   50,000 Units at 03/28/15 1311   Current Outpatient Prescriptions  Medication Sig Dispense Refill  . ARIPiprazole 400 MG SUSR Inject 1 each into the muscle every 28 (twenty-eight) days.    . Melatonin 5 MG TABS Take 2 tablets by mouth at bedtime.     . traZODone (DESYREL) 100 MG tablet Take 200 mg by mouth at bedtime.    . Vitamin D, Ergocalciferol, (DRISDOL) 50000 UNITS CAPS capsule Take 50,000 Units by mouth every 7 (seven) days.      Musculoskeletal: Strength & Muscle Tone: within normal limits Gait & Station: normal Patient leans: N/A  Psychiatric  Specialty Exam: Physical Exam  Review of Systems  Constitutional: Negative.   HENT: Negative.   Eyes: Negative.   Respiratory: Negative.   Cardiovascular: Negative.   Gastrointestinal: Negative.   Genitourinary: Negative.   Musculoskeletal: Negative.    Skin: Negative.   Neurological: Negative.     Blood pressure 110/88, pulse 68, temperature 98 F (36.7 C), temperature source Oral, resp. rate 18, SpO2 100 %.There is no weight on file to calculate BMI.  General Appearance: Casual  Eye Contact::  Good  Speech:  Clear and Coherent and MINIMAL, Inappropriate laughter  Volume:  Normal  Mood:  Irritable  Affect:  Congruent  Thought Process:  Disorganized, Loose and Tangential  Orientation:  Full (Time, Place, and Person)  Thought Content:  Hallucinations: Auditory  Suicidal Thoughts:  No  Homicidal Thoughts:  No  Memory:  Immediate;   Fair Recent;   Fair Remote;   Fair  Judgement:  Poor  Insight:  Shallow  Psychomotor Activity:  Psychomotor Retardation  Concentration:  Poor  Recall:  NA  Fund of Knowledge:Poor  Language: Poor  Akathisia:  NA  Handed:  Right  AIMS (if indicated):     Assets:  Desire for Improvement  ADL's:  Intact  Cognition: WNL  Sleep:      Medical Decision Making: Established Problem, Worsening (2), Review of Medication Regimen & Side Effects (2) and Review of New Medication or Change in Dosage (2)  Treatment Plan Summary: Daily contact with patient to assess and evaluate symptoms and progress in treatment and Medication management  Plan:  Resume home medications,   Disposition:  Admit and seek placement  Delfin Gant   PMHNP-BC 03/28/2015 4:46 PM Patient seen face-to-face for psychiatric evaluation, chart reviewed and case discussed with the physician extender and developed treatment plan. Reviewed the information documented and agree with the treatment plan. Corena Pilgrim, MD
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
## 2015-03-28 NOTE — ED Notes (Signed)
Patient awoke. Patient incontinent of urine. Patient had complete linen change and scrub changed. Encouragement and support provided and safety maintain. Q 15 min safety checks remain in place.

## 2015-03-29 DIAGNOSIS — F259 Schizoaffective disorder, unspecified: Secondary | ICD-10-CM | POA: Diagnosis not present

## 2015-03-29 LAB — RAPID URINE DRUG SCREEN, HOSP PERFORMED
Amphetamines: NOT DETECTED
Barbiturates: NOT DETECTED
Benzodiazepines: POSITIVE — AB
Cocaine: NOT DETECTED
Opiates: NOT DETECTED
Tetrahydrocannabinol: NOT DETECTED

## 2015-03-29 MED ORDER — DIPHENHYDRAMINE HCL 50 MG/ML IJ SOLN
50.0000 mg | Freq: Once | INTRAMUSCULAR | Status: AC
  Administered 2015-03-29: 50 mg via INTRAMUSCULAR
  Filled 2015-03-29: qty 1

## 2015-03-29 MED ORDER — LORAZEPAM 1 MG PO TABS
ORAL_TABLET | ORAL | Status: AC
Start: 2015-03-29 — End: 2015-03-29
  Administered 2015-03-29: 12:00:00
  Filled 2015-03-29: qty 2

## 2015-03-29 MED ORDER — LORAZEPAM 1 MG PO TABS
2.0000 mg | ORAL_TABLET | Freq: Once | ORAL | Status: AC
  Administered 2015-03-29: 2 mg via ORAL
  Filled 2015-03-29: qty 2

## 2015-03-29 MED ORDER — BENZTROPINE MESYLATE 1 MG PO TABS
1.0000 mg | ORAL_TABLET | Freq: Every day | ORAL | Status: DC
  Administered 2015-03-29 – 2015-03-31 (×3): 1 mg via ORAL
  Filled 2015-03-29 (×3): qty 1

## 2015-03-29 MED ORDER — ZIPRASIDONE MESYLATE 20 MG IM SOLR
20.0000 mg | Freq: Once | INTRAMUSCULAR | Status: DC
  Filled 2015-03-29: qty 20

## 2015-03-29 MED ORDER — HYDROXYZINE HCL 25 MG PO TABS
25.0000 mg | ORAL_TABLET | Freq: Two times a day (BID) | ORAL | Status: DC
  Administered 2015-03-29: 25 mg via ORAL
  Filled 2015-03-29: qty 1

## 2015-03-29 MED ORDER — LORAZEPAM 2 MG/ML IJ SOLN
2.0000 mg | Freq: Once | INTRAMUSCULAR | Status: AC
  Filled 2015-03-29: qty 1

## 2015-03-29 MED ORDER — PALIPERIDONE ER 6 MG PO TB24
6.0000 mg | ORAL_TABLET | Freq: Every day | ORAL | Status: DC
  Administered 2015-03-29 – 2015-04-02 (×5): 6 mg via ORAL
  Filled 2015-03-29 (×5): qty 1

## 2015-03-29 NOTE — ED Notes (Signed)
Pt requesting new pair of paper scrubs. RN noticed bed sheet was wet. Pt asked why he did not get up to use the restroom and pt stated, "its because I'm incontinent." Pt made aware that behavior was unacceptable and that he was not incontinent as evidenced by him getting up to urinate 3 time already during this shift. Pt agreed. Linens changed and pt changed into new scrubs. Pt also instructed to use urinal to obtain urine specimen at as soon as possible.

## 2015-03-29 NOTE — ED Notes (Signed)
Geodon, Benadryl and Ativan held due to pt quiet and in bed after getting the medication prepared. Will hold till pt needs meds.

## 2015-03-29 NOTE — ED Notes (Signed)
Pt is awake and alert, responding to internal stimuli, pt is having outburst to the internal voices. Sitter at bedside. Will continue to monitor for safety. tlewis RN

## 2015-03-29 NOTE — BHH Counselor (Addendum)
Pt was asked questions about his current state. Pt could not answer questions. Pt was responding to internal stimuli. Pt was cursing and arguing to himself.  Per Dr. Jannifer Franklin and Julieanne Cotton, NP Pt still meets inpatient criteria. TTS continues to look for placement.  Wolfgang Phoenix, [facility] Triage Specialist

## 2015-03-29 NOTE — Progress Notes (Signed)
CSW referred patient to the following hospitals in order to try and obtain inpatient placement:  [facility] [address] Esmond Camper [phone] ED CSW 03/29/2015 8:02 PM

## 2015-03-29 NOTE — Progress Notes (Signed)
Pt noted to be on one knee after getting out of a chair sitting in front of nursing station desk earlier in am. Pt was asked by security to get back in his chair and pt noted to remain steady in knee position until assisted back in chair. Pt later noted walking from rm 26 area toward nursing station Pt redirected back to his room by security and nursing.

## 2015-03-29 NOTE — ED Notes (Signed)
Pt requesting to use the phone. Pt made aware phone calls can be made after 9am. Pt standing at the door saying. "I wanna go home."

## 2015-03-30 DIAGNOSIS — F259 Schizoaffective disorder, unspecified: Secondary | ICD-10-CM | POA: Diagnosis not present

## 2015-03-30 MED ORDER — DIPHENHYDRAMINE HCL 50 MG/ML IJ SOLN
50.0000 mg | Freq: Once | INTRAMUSCULAR | Status: DC
  Filled 2015-03-30: qty 1

## 2015-03-30 MED ORDER — LORAZEPAM 2 MG/ML IJ SOLN
2.0000 mg | Freq: Once | INTRAMUSCULAR | Status: DC
  Filled 2015-03-30: qty 1

## 2015-03-30 MED ORDER — HYDROXYZINE HCL 25 MG PO TABS
25.0000 mg | ORAL_TABLET | Freq: Three times a day (TID) | ORAL | Status: DC | PRN
  Administered 2015-03-31: 25 mg via ORAL
  Filled 2015-03-30: qty 1

## 2015-03-30 MED ORDER — STERILE WATER FOR INJECTION IJ SOLN
INTRAMUSCULAR | Status: AC
  Filled 2015-03-30: qty 10

## 2015-03-30 MED ORDER — NICOTINE 21 MG/24HR TD PT24
21.0000 mg | MEDICATED_PATCH | Freq: Every day | TRANSDERMAL | Status: DC
  Administered 2015-04-01 – 2015-04-02 (×2): 21 mg via TRANSDERMAL
  Filled 2015-03-30 (×2): qty 1

## 2015-03-30 MED ORDER — NICOTINE 21 MG/24HR TD PT24
MEDICATED_PATCH | TRANSDERMAL | Status: AC
  Filled 2015-03-30: qty 1

## 2015-03-30 MED ORDER — ZIPRASIDONE MESYLATE 20 MG IM SOLR
INTRAMUSCULAR | Status: AC
  Filled 2015-03-30: qty 20

## 2015-03-30 MED ORDER — TRAZODONE HCL 50 MG PO TABS
50.0000 mg | ORAL_TABLET | Freq: Every evening | ORAL | Status: DC | PRN
  Administered 2015-03-31 – 2015-04-01 (×2): 50 mg via ORAL
  Filled 2015-03-30 (×3): qty 1

## 2015-03-30 MED ORDER — ZIPRASIDONE MESYLATE 20 MG IM SOLR: 20.0000 mg | Freq: Once | INTRAMUSCULAR | Status: DC

## 2015-03-30 MED ORDER — QUETIAPINE FUMARATE 50 MG PO TABS
50.0000 mg | ORAL_TABLET | Freq: Every day | ORAL | Status: DC
  Administered 2015-03-31 – 2015-04-01 (×2): 50 mg via ORAL
  Filled 2015-03-30 (×3): qty 1

## 2015-03-30 NOTE — BH Assessment (Addendum)
BHH Assessment Progress Note  Pt seen for reassessment this day.  Pt was lying in bed, mumbling.  Pt did state that he was in the ED for "acting out."  Clinician was unable to understand pt as he was mumbling.  Pt cooperative and pleasant.  Pt pending several inpatient facilities.  Casimer Lanius, MS, [facility] Therapeutic Triage Specialist [facility]

## 2015-03-30 NOTE — ED Notes (Signed)
Pt is awake and alert, with random loud outburst. Assisted pt the restroom. Safety monitored and maintained. tlewis
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
## 2015-03-31 DIAGNOSIS — F259 Schizoaffective disorder, unspecified: Secondary | ICD-10-CM | POA: Diagnosis not present

## 2015-03-31 MED ORDER — BENZTROPINE MESYLATE 1 MG PO TABS
1.0000 mg | ORAL_TABLET | Freq: Two times a day (BID) | ORAL | Status: DC
  Administered 2015-03-31 – 2015-04-02 (×4): 1 mg via ORAL
  Filled 2015-03-31 (×4): qty 1

## 2015-03-31 MED ORDER — LORAZEPAM 1 MG PO TABS
2.0000 mg | ORAL_TABLET | Freq: Once | ORAL | Status: AC
  Administered 2015-03-31: 2 mg via ORAL
  Filled 2015-03-31: qty 2
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
## 2015-03-31 NOTE — ED Notes (Signed)
Pt threw his tray onto the floor and when reported asked why he did it he said that the woman voice told him to do so. He kept repeatedly saying that he apologize and that he hates doing stuff like this.

## 2015-03-31 NOTE — ED Notes (Signed)
Pt seen by CSW and psych, NP and Dr-team.

## 2015-03-31 NOTE — ED Notes (Signed)
Pt is awake, sitting up in bed.  Rn observed pt singing, mumbling, and randomly shouting "ill break your got d*mn neck for real". No one is in the room with pt.  Sitter sitting outside of door.  Pt not physically aggressive and appears to be talking to himself at this time.

## 2015-03-31 NOTE — ED Notes (Signed)
Patient is resting comfortably, awake, watching television

## 2015-03-31 NOTE — Progress Notes (Signed)
Re-assessment Note:  This Clinical research associate made multiple attempts to wake the patient with little success.  Patient was able to wake enough to mumble some words.  According to the nursing staff the patient had been sleeping excessively to point the staff needing to complete is ADLs and rotate him while sleeping.    Nursing staff later called saying the patient finally woke now alert and oriented x4.  Consulted with Dr. Jannifer Franklin it is still recommended to refer for inpatient treatment and medication adjustments.     Maryelizabeth Rowan, MSW, [facility] Triage Specialist [phone]

## 2015-03-31 NOTE — Progress Notes (Signed)
12:36pm. CSW faxed [facility] application Shelly Coss auth #: [id number] thru 04/06/15) and completed demographics interview with intake. Awaiting approval to waiting list. CSW to continue to follow.  York Spaniel [facility] Clinical Social Worker [facility] phone: [phone]

## 2015-03-31 NOTE — ED Notes (Signed)
Placed patient bed alarm on the bed and pulled curtian back to eliminate view for nurse and sitter. Pt was asked multiple times to stop masturbating.

## 2015-03-31 NOTE — ED Notes (Signed)
Called pharmacy to fix mar.

## 2015-03-31 NOTE — ED Notes (Signed)
Pt alert and oriented x4.  Denies SI/HI, auditory or visual hallucinations.  Movement is slow, but pt is eating breakfast at this time. Responding to commands and answering questions appropriately. RN observed pt touching genitals, redirected pt to clean hands with sanitary wipes and eat breakfast.

## 2015-04-01 DIAGNOSIS — F259 Schizoaffective disorder, unspecified: Secondary | ICD-10-CM | POA: Diagnosis not present

## 2015-04-01 MED ORDER — ZIPRASIDONE MESYLATE 20 MG IM SOLR
20.0000 mg | Freq: Once | INTRAMUSCULAR | Status: AC
  Administered 2015-04-01: 20 mg via INTRAMUSCULAR
  Filled 2015-04-01: qty 20

## 2015-04-01 NOTE — ED Notes (Signed)
Tammy Sours, [facility] counselor at bedside.

## 2015-04-01 NOTE — ED Notes (Signed)
Patient is resting comfortably. 

## 2015-04-01 NOTE — ED Notes (Signed)
Awake. Verbally responsive. Watching TV and interacting with staff. Resp even and unlabored. ABC's intact. Cooperative calm behavior. Pt does have outburst of talking/laughing. NAD noted. Sitter at bedside.

## 2015-04-01 NOTE — ED Notes (Signed)
Pt awake. Verbally responsive. Resp even and unlabored. No audible adventitious breath sounds noted. ABC's intact. Pt having multiple episodes of self masturbating with strong encouragement to cover himself up. Pt eaten lunch.

## 2015-04-01 NOTE — ED Notes (Signed)
Pt was attempting to leave dept when tech was able to easily re-direct him back to his room. Pt cooperative and listens to staff but pt continues to have audible hallucinations. Mother at bedside.

## 2015-04-01 NOTE — Progress Notes (Signed)
10:09am. Per Willa Rough at [facility], pt not yet on waitlist--medical team is reviewing pt due to pt's medical needs. Pt does not meet priority criteria at this time.  York Spaniel [facility] Clinical Social Worker [facility] phone: [phone]

## 2015-04-01 NOTE — ED Notes (Signed)
Awake. Verbally responsive. Resp even and unlabored. ABC's intact. Pt continues to yell out at times. Pt speaks to other people but denies audible/visual hallucinations. Sitter at bedside.

## 2015-04-01 NOTE — BH Assessment (Signed)
[facility] Assessment Progress Note  Clinician met with patient 1:1 to complete reassessment. Patient was observed to be lying down in bed and provided limited eye contact. Patient states that he was admitted due to anger issues and punching holes in the wall because he was upset. Patient reports that currently he does not want to hurt himself or others, stating "my mama gave me life. She had me when she was in a wheelchair. I would never hurt her". Patient denies hearing voices today but does state that they come and go. Patient states that he is unable to control his anger at times but is hoping to "do better". Insight remains limited at this time as patient minimizes his behaviors that led to his ED admission.     Boyce Medici. MSW, LCSW Therapeutic Triage Services-Triage Specialist   Phone: [phone]

## 2015-04-01 NOTE — ED Notes (Signed)
Pt saw PT and immediately jumped from bed and started talking to self. Tech at bedside and pt calmed down.

## 2015-04-01 NOTE — ED Notes (Signed)
Awake. Verbally responsive. Resp even and unlabored. ABC's intact. No behavior problems noted. Watching TV. NAD noted. Father at bedside.

## 2015-04-01 NOTE — ED Notes (Signed)
Resting quietly with eye closed. Easily arousable. Verbally responsive. Resp even and unlabored. ABC's intact. No behavior problems noted. NAD noted. Sitter at bedside.  

## 2015-04-01 NOTE — Evaluation (Signed)
Physical Therapy Evaluation Patient Details Name: Brandon [name] MRN: [id number] DOB: 23-May-1991 Today's Date: 04/01/2015   History of Present Illness  Pt admitted with schizoaffective disorder - holding for longer term placement and adjustment of meds; Hx of MS  Clinical Impression  Pt admitted as above and presenting as mobilizing at Mod I to min guard assist for safety.  Will follow up with more formal testing of pt balance.    Follow Up Recommendations  (Plans for placement for adjustment of meds)    Equipment Recommendations  None recommended by PT    Recommendations for Other Services       Precautions / Restrictions Precautions Precautions: Fall Restrictions Weight Bearing Restrictions: No      Mobility  Bed Mobility Overal bed mobility: Modified Independent             General bed mobility comments: Pt in/out bed unassisted  Transfers Overall transfer level: Modified independent               General transfer comment: Pt sit<>stand unassisted  Ambulation/Gait Ambulation/Gait assistance: Min guard Ambulation Distance (Feet): 300 Feet Assistive device: Rolling walker (2 wheeled);None Gait Pattern/deviations: Step-through pattern;Shuffle;Wide base of support Gait velocity: mod pace   General Gait Details: Pt ambulated 150' with RW and min guard.  Pt ambulated additional 150' sans AD with slightly wider BOS noted and min guard.  Pt with no loss of balance including stepping over call light stretched between chair and bed  Stairs            Wheelchair Mobility    Modified Rankin (Stroke Patients Only)       Balance Overall balance assessment: Needs assistance Sitting-balance support: Feet supported Sitting balance-Leahy Scale: Normal     Standing balance support: No upper extremity supported Standing balance-Leahy Scale: Good                               Pertinent Vitals/Pain Pain Assessment: No/denies pain     Home Living   Living Arrangements: Parent               Additional Comments: Per chart, pt for longer term placement for adjustment of meds    Prior Function           Comments: unclear from pt - states he used a crutch or a walker but did not state why     Hand Dominance        Extremity/Trunk Assessment   Upper Extremity Assessment: Overall WFL for tasks assessed           Lower Extremity Assessment: Overall WFL for tasks assessed      Cervical / Trunk Assessment: Normal  Communication   Communication: No difficulties  Cognition Arousal/Alertness: Awake/alert Behavior During Therapy: Flat affect;Impulsive Overall Cognitive Status: History of cognitive impairments - at baseline                      General Comments      Exercises        Assessment/Plan    PT Assessment    PT Diagnosis Difficulty walking   PT Problem List    PT Treatment Interventions     PT Goals (Current goals can be found in the Care Plan section) Acute Rehab PT Goals Patient Stated Goal: No goals expressed by pt PT Goal Formulation: Patient unable to participate in goal setting Time For Goal  Achievement: 04/11/15 Potential to Achieve Goals: Good    Frequency     Barriers to discharge        Co-evaluation               End of Session   Activity Tolerance: Patient tolerated treatment well Patient left: in bed;with call bell/phone within reach Nurse Communication: Mobility status    Functional Assessment Tool Used: Clinical judgement Functional Limitation: Mobility: Walking and moving around Mobility: Walking and Moving Around Current Status (W1191): At least 1 percent but less than 20 percent impaired, limited or restricted Mobility: Walking and Moving Around Goal Status [phone]): At least 1 percent but less than 20 percent impaired, limited or restricted    Time: 1355-1411 PT Time Calculation (min) (ACUTE ONLY): 16 min   Charges:   PT  Evaluation $Initial PT Evaluation Tier I: 1 Procedure     PT G Codes:   PT G-Codes **NOT FOR INPATIENT CLASS** Functional Assessment Tool Used: Clinical judgement Functional Limitation: Mobility: Walking and moving around Mobility: Walking and Moving Around Current Status (F6213): At least 1 percent but less than 20 percent impaired, limited or restricted Mobility: Walking and Moving Around Goal Status [phone]): At least 1 percent but less than 20 percent impaired, limited or restricted    Brandon [name],Brandon [name] 04/01/2015, 3:55 PM

## 2015-04-01 NOTE — ED Notes (Signed)
PT at bedside for evaluation with recommendation that pt may ambulate with x1 assist and no need for walker/cane devices at this time.

## 2015-04-01 NOTE — ED Notes (Addendum)
Pt is awake and alert, responding to internal stimuli. Pt hearing voices reports they're yelling and screaming at me. Pt is not redirectable. Pt is threw his bedside table and punched the wall. Pt is agaitated and aggressive. Safety monitored and maintained. Tlewis RN

## 2015-04-01 NOTE — ED Notes (Signed)
Awake. Verbally responsive. Resp even and unlabored. ABC's intact. Pt attempting to self ambulate with unsteady gait. Pt encourage to ask for assistance to prevent falls. Verbalized understanding. Pt continues to talk to self. Sitter at bedside.

## 2015-04-01 NOTE — ED Notes (Signed)
Awake. Verbally responsive. Resp even and unlabored. ABC's intact. Pt yells out at times. Pt requesting "blue pill" for sexual activity. NAD noted. Sitter at bedside.

## 2015-04-01 NOTE — ED Notes (Signed)
Awake. Verbally responsive. Watching TV. Continues to have episodes of yelling out. Resp even and unlabored. ABC's intact. Pleasant and cooperative behavior. NAD noted. Sitter at bedside.

## 2015-04-02 DIAGNOSIS — F259 Schizoaffective disorder, unspecified: Secondary | ICD-10-CM | POA: Diagnosis not present

## 2015-04-02 MED ORDER — TRAZODONE HCL 50 MG PO TABS: 50.0000 mg | ORAL_TABLET | Freq: Every evening | ORAL | Status: DC | PRN

## 2015-04-02 MED ORDER — BENZTROPINE MESYLATE 1 MG PO TABS: 1.0000 mg | ORAL_TABLET | Freq: Two times a day (BID) | ORAL | Status: DC

## 2015-04-02 MED ORDER — HYDROXYZINE HCL 25 MG PO TABS: 25.0000 mg | ORAL_TABLET | Freq: Three times a day (TID) | ORAL | Status: DC | PRN

## 2015-04-02 MED ORDER — OXCARBAZEPINE 600 MG PO TABS: 600.0000 mg | ORAL_TABLET | Freq: Two times a day (BID) | ORAL | Status: DC

## 2015-04-02 MED ORDER — PALIPERIDONE ER 6 MG PO TB24
6.0000 mg | ORAL_TABLET | Freq: Every day | ORAL | Status: DC
Start: 2015-04-02 — End: 2015-05-03

## 2015-04-02 MED ORDER — QUETIAPINE FUMARATE 50 MG PO TABS: 50.0000 mg | ORAL_TABLET | Freq: Every day | ORAL | Status: DC

## 2015-04-02 NOTE — [facility] Suicide Risk Assessment (Signed)
Suicide Risk Assessment  Discharge Assessment   [facility] Discharge Suicide Risk Assessment   Demographic Factors:  Male  Total Time spent with patient: 30 minutes  Musculoskeletal: Strength & Muscle Tone: decreased Gait & Station: unsteady Patient leans: N/A  Psychiatric Specialty Exam: Physical Exam  Review of Systems  Constitutional: Negative.   HENT: Negative.   Eyes: Negative.   Respiratory: Negative.   Cardiovascular: Negative.   Gastrointestinal: Negative.   Genitourinary: Negative.   Musculoskeletal: Negative.   Skin: Negative.   Neurological: Negative.   Endo/Heme/Allergies: Negative.   Psychiatric/Behavioral:       Negative    Blood pressure 102/82, pulse 85, temperature 98.2 F (36.8 C), temperature source Oral, resp. rate 20, SpO2 100 %.There is no weight on file to calculate BMI.  General Appearance: Casual  Eye Contact::  Good  Speech:  Normal Rate  Volume:  Normal  Mood:  Euthymic  Affect:  Congruent  Thought Process:  Coherent  Orientation:  Full (Time, Place, and Person)  Thought Content:  WDL  Suicidal Thoughts:  No  Homicidal Thoughts:  No  Memory:  Immediate;   Good Recent;   Good Remote;   Good  Judgement:  Fair  Insight:  Fair  Psychomotor Activity:  Normal  Concentration:  Good  Recall:  Good  Fund of Knowledge:Good  Language: Good  Akathisia:  No  Handed:  Right  AIMS (if indicated):     Assets:  Housing Leisure Time Resilience Social Support  ADL's:  Intact  Cognition: WNL  Sleep:      Has this patient used any form of tobacco in the last 30 days? (Cigarettes, Smokeless Tobacco, Cigars, and/or Pipes) Yes, A prescription for an FDA-approved tobacco cessation medication was offered at discharge and the patient refused  Mental Status Per Nursing Assessment::   On Admission:   Hallucinations and suicidal ideations  Current Mental Status by Physician: NA  Loss Factors: NA  Historical Factors: NA  Risk Reduction Factors:    Sense of responsibility to family, Living with another person, especially a relative, Positive social support and Positive therapeutic relationship  Continued Clinical Symptoms:  None   Cognitive Features That Contribute To Risk:  None    Suicide Risk:  Minimal: No identifiable suicidal ideation.  Patients presenting with no risk factors but with morbid ruminations; may be classified as minimal risk based on the severity of the depressive symptoms  Principal Problem: Schizoaffective disorder, unspecified type Discharge Diagnoses:  Patient Active Problem List   Diagnosis Date Noted  . Hallucinations [R44.3] 08/05/2014    Priority: High  . Homicidal ideation [R45.850]     Priority: High  . Schizoaffective disorder, unspecified type [F25.9]     Priority: High  . Multiple sclerosis [G35] 05/20/2013    Priority: High  . Auditory hallucination [R44.0]   . Left-sided weakness [M62.89] 01/18/2014  . White matter abnormality on MRI of brain [R93.0] 11/23/2012  . Abnormality of gait [R26.9] 11/23/2012  . HTN (hypertension) [I10] 07/04/2011  . Ataxia [R27.0] 07/02/2011  . OBESITY [E66.9] 01/14/2010  . ADHD [F90.9] 01/14/2010      Plan Of Care/Follow-up recommendations:  Activity:  as tolerated Diet:  heart healthy diet  Is patient on multiple antipsychotic therapies at discharge:  No   Has Patient had three or more failed trials of antipsychotic monotherapy by history:  No  Recommended Plan for Multiple Antipsychotic Therapies: NA    LORD, JAMISON, PMH-NP 04/02/2015, 12:15 PM
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
## 2015-04-02 NOTE — Discharge Instructions (Signed)
Schizoaffective Disorder Schizoaffective disorder (ScAD) is a mental illness. It causes symptoms that are a mixture of schizophrenia (a psychotic disorder) and an affective (mood) disorder. The schizophrenic symptoms may include delusions, hallucinations, or odd behavior. The mood symptoms may be similar to major depression or bipolar disorder. ScAD may interfere with personal relationships or normal daily activities. People with ScAD are at increased risk for job loss, social isolation,physical health problems, anxiety and substance use disorders, and suicide. ScAD usually occurs in cycles. Periods of severe symptoms are followed by periods of less severe symptoms or improvement. The illness affects men and women equally but usually appears at an earlier age (teenage or early adult years) in men. People who have family members with schizophrenia, bipolar disorder, or ScAD are at higher risk of developing ScAD. SYMPTOMS  At any one time, people with ScAD may have psychotic symptoms only or both psychotic and mood symptoms. The psychotic symptoms include one or more of the following:  Hearing, seeing, or feeling things that are not there (hallucinations).   Having fixed, false beliefs (delusions). The delusions usually are of being attacked, harassed, cheated, persecuted, or conspired against (paranoid delusions).  Speaking in a way that makes no sense to others (disorganized speech). The psychotic symptoms of ScAD may also include confusing or odd behavior or any of the negative symptoms of schizophrenia. These include loss of motivation for normal daily activities, such as bathing or grooming, withdrawal from other people, and lack of emotions.  The mood symptoms of ScAD occur more often than not. They resemble major depressive disorder or bipolar mania. Symptoms of major depression include depressed mood and four or more of the following:  Loss of interest in usually pleasurable activities  (anhedonia).  Sleeping more or less than normal.  Feeling worthless or excessively guilty.  Lack of energy or motivation.  Trouble concentrating.  Eating more or less than usual.  Thinking a lot about death or suicide. Symptoms of bipolar mania include abnormally elevated or irritable mood and increased energy or activity, plus three or more of the following:   More confidence than normal or feeling that you are able to do anything (grandiosity).  Feeling rested with less sleep than normal.   Being easily distracted.   Talking more than usual or feeling pressured to keep talking.   Feeling that your thoughts are racing.  Engaging in high-risk activities such as buying sprees or foolish business decisions. DIAGNOSIS  ScAD is diagnosed through an assessment by your health care provider. Your health care provider will observe and ask questions about your thoughts, behavior, mood, and ability to function in daily life. Your health care provider may also ask questions about your medical history and use of drugs, including prescription medicines. Your health care provider may also order blood tests and imaging exams. Certain medical conditions and substances can cause symptoms that resemble ScAD. Your health care provider may refer you to a mental health specialist for evaluation.  ScAD is divided into two types. The depressive type is diagnosed if your mood symptoms are limited to major depression. The bipolar type is diagnosed if your mood symptoms are manic or a mixture of manic and depressive symptoms TREATMENT  ScAD is usually a lifelong illness. Long-term treatment is necessary. The following treatments are available:  Medicine. Different types of medicine are used to treat ScAD. The exact combination depends on the type and severity of your symptoms. Antipsychotic medicine is used to control psychotic symptomssuch as delusions, paranoia,   and hallucinations. Mood stabilizers can  even the highs and lows of bipolar manic mood swings. Antidepressant medicines are used to treat major depressive symptoms.  Counseling or talk therapy. Individual, group, or family counseling may be helpful in providing education, support, and guidance. Many people with ScAD also benefit from social skills and job skills (vocational) training. A combination of medicine and counseling is usually best for managing the disorder over time. A procedure in which electricity is applied to the brain through the scalp (electroconvulsive therapy) may be used to treat people with severe manic symptoms that do not respond to medicine and counseling. HOME CARE INSTRUCTIONS   Take all your medicine as prescribed.  Check with your health care provider before starting new prescription or over-the-counter medicines.  Keep all follow up appointments with your health care provider. SEEK MEDICAL CARE IF:   If you are not able to take your medicines as prescribed.  If your symptoms get worse. SEEK IMMEDIATE MEDICAL CARE IF:   You have serious thoughts about hurting yourself or others. Document Released: 11/03/2006 Document Revised: 11/07/2013 Document Reviewed: 02/04/2013 [employer] Patient Information 2015 [employer]. This information is not intended to replace advice given to you by your health care provider. Make sure you discuss any questions you have with your health care provider.  

## 2015-04-03 LAB — HEMOGLOBIN A1C
Hgb A1c MFr Bld: 5.9 % — ABNORMAL HIGH (ref 4.8–5.6)
Mean Plasma Glucose: 123 mg/dL

## 2015-04-03 LAB — VITAMIN D 25 HYDROXY (VIT D DEFICIENCY, FRACTURES): Vit D, 25-Hydroxy: 50 ng/mL (ref 30.0–100.0)

## 2015-04-07 LAB — VITAMIN D 1,25 DIHYDROXY
Vitamin D 1, 25 (OH)2 Total: 18 pg/mL
Vitamin D2 1, 25 (OH)2: 14 pg/mL
Vitamin D3 1, 25 (OH)2: <10 pg/mL

## 2015-04-09 ENCOUNTER — Encounter (HOSPITAL_COMMUNITY): Payer: Self-pay

## 2015-04-09 ENCOUNTER — Encounter (HOSPITAL_COMMUNITY)
Admission: RE | Admit: 2015-04-09 | Discharge: 2015-04-09 | Disposition: A | Discharge status: Home / Self Care | Payer: Medicare Other | Source: Ambulatory Visit | Attending: Neurology | Admitting: Neurology

## 2015-04-09 VITALS — BP 111/76 | HR 65 | Temp 97.6°F | Resp 18 | Ht 70.0 in | Wt 293.0 lb

## 2015-04-09 DIAGNOSIS — G35 Multiple sclerosis: Secondary | ICD-10-CM | POA: Insufficient documentation

## 2015-04-09 MED ORDER — SODIUM CHLORIDE 0.9 % IV SOLN
300.0000 mg | INTRAVENOUS | Status: DC
  Administered 2015-04-09: 300 mg via INTRAVENOUS
  Filled 2015-04-09: qty 15

## 2015-04-09 MED ORDER — LORATADINE 10 MG PO TABS
10.0000 mg | ORAL_TABLET | ORAL | Status: AC
  Administered 2015-04-09: 10 mg via ORAL
  Filled 2015-04-09: qty 1

## 2015-04-09 MED ORDER — SODIUM CHLORIDE 0.9 % IV SOLN
INTRAVENOUS | Status: DC
  Administered 2015-04-09: 09:00:00 via INTRAVENOUS

## 2015-04-09 MED ORDER — ACETAMINOPHEN 325 MG PO TABS
650.0000 mg | ORAL_TABLET | ORAL | Status: AC
  Administered 2015-04-09: 650 mg via ORAL
  Filled 2015-04-09: qty 2

## 2015-04-19 ENCOUNTER — Encounter (HOSPITAL_COMMUNITY): Payer: Self-pay | Admitting: Emergency Medicine

## 2015-04-19 ENCOUNTER — Emergency Department (HOSPITAL_COMMUNITY)
Admission: EM | Admit: 2015-04-19 | Discharge: 2015-04-19 | Disposition: A | Discharge status: Home / Self Care | Payer: Medicare Other | Attending: Emergency Medicine | Admitting: Emergency Medicine

## 2015-04-19 DIAGNOSIS — F319 Bipolar disorder, unspecified: Secondary | ICD-10-CM | POA: Insufficient documentation

## 2015-04-19 DIAGNOSIS — F259 Schizoaffective disorder, unspecified: Secondary | ICD-10-CM | POA: Diagnosis not present

## 2015-04-19 DIAGNOSIS — I10 Essential (primary) hypertension: Secondary | ICD-10-CM | POA: Diagnosis not present

## 2015-04-19 DIAGNOSIS — Z79899 Other long term (current) drug therapy: Secondary | ICD-10-CM | POA: Insufficient documentation

## 2015-04-19 DIAGNOSIS — G8929 Other chronic pain: Secondary | ICD-10-CM | POA: Insufficient documentation

## 2015-04-19 DIAGNOSIS — Z720 Tobacco use: Secondary | ICD-10-CM | POA: Insufficient documentation

## 2015-04-19 DIAGNOSIS — F911 Conduct disorder, childhood-onset type: Secondary | ICD-10-CM | POA: Diagnosis present

## 2015-04-19 DIAGNOSIS — E669 Obesity, unspecified: Secondary | ICD-10-CM | POA: Insufficient documentation

## 2015-04-19 NOTE — ED Notes (Signed)
AVS explained in detail. No other c/c. Ambulatory. With police-to return to private residence.

## 2015-04-19 NOTE — Discharge Instructions (Signed)
Schizoaffective Disorder Schizoaffective disorder (ScAD) is a mental illness. It causes symptoms that are a mixture of schizophrenia (a psychotic disorder) and an affective (mood) disorder. The schizophrenic symptoms may include delusions, hallucinations, or odd behavior. The mood symptoms may be similar to major depression or bipolar disorder. ScAD may interfere with personal relationships or normal daily activities. People with ScAD are at increased risk for job loss, social isolation,physical health problems, anxiety and substance use disorders, and suicide. ScAD usually occurs in cycles. Periods of severe symptoms are followed by periods of less severe symptoms or improvement. The illness affects men and women equally but usually appears at an earlier age (teenage or early adult years) in men. People who have family members with schizophrenia, bipolar disorder, or ScAD are at higher risk of developing ScAD. SYMPTOMS  At any one time, people with ScAD may have psychotic symptoms only or both psychotic and mood symptoms. The psychotic symptoms include one or more of the following:  Hearing, seeing, or feeling things that are not there (hallucinations).   Having fixed, false beliefs (delusions). The delusions usually are of being attacked, harassed, cheated, persecuted, or conspired against (paranoid delusions).  Speaking in a way that makes no sense to others (disorganized speech). The psychotic symptoms of ScAD may also include confusing or odd behavior or any of the negative symptoms of schizophrenia. These include loss of motivation for normal daily activities, such as bathing or grooming, withdrawal from other people, and lack of emotions.  The mood symptoms of ScAD occur more often than not. They resemble major depressive disorder or bipolar mania. Symptoms of major depression include depressed mood and four or more of the following:  Loss of interest in usually pleasurable activities  (anhedonia).  Sleeping more or less than normal.  Feeling worthless or excessively guilty.  Lack of energy or motivation.  Trouble concentrating.  Eating more or less than usual.  Thinking a lot about death or suicide. Symptoms of bipolar mania include abnormally elevated or irritable mood and increased energy or activity, plus three or more of the following:   More confidence than normal or feeling that you are able to do anything (grandiosity).  Feeling rested with less sleep than normal.   Being easily distracted.   Talking more than usual or feeling pressured to keep talking.   Feeling that your thoughts are racing.  Engaging in high-risk activities such as buying sprees or foolish business decisions. DIAGNOSIS  ScAD is diagnosed through an assessment by your health care provider. Your health care provider will observe and ask questions about your thoughts, behavior, mood, and ability to function in daily life. Your health care provider may also ask questions about your medical history and use of drugs, including prescription medicines. Your health care provider may also order blood tests and imaging exams. Certain medical conditions and substances can cause symptoms that resemble ScAD. Your health care provider may refer you to a mental health specialist for evaluation.  ScAD is divided into two types. The depressive type is diagnosed if your mood symptoms are limited to major depression. The bipolar type is diagnosed if your mood symptoms are manic or a mixture of manic and depressive symptoms TREATMENT  ScAD is usually a lifelong illness. Long-term treatment is necessary. The following treatments are available:  Medicine. Different types of medicine are used to treat ScAD. The exact combination depends on the type and severity of your symptoms. Antipsychotic medicine is used to control psychotic symptomssuch as delusions, paranoia,  and hallucinations. Mood stabilizers can  even the highs and lows of bipolar manic mood swings. Antidepressant medicines are used to treat major depressive symptoms.  Counseling or talk therapy. Individual, group, or family counseling may be helpful in providing education, support, and guidance. Many people with ScAD also benefit from social skills and job skills (vocational) training. A combination of medicine and counseling is usually best for managing the disorder over time. A procedure in which electricity is applied to the brain through the scalp (electroconvulsive therapy) may be used to treat people with severe manic symptoms that do not respond to medicine and counseling. HOME CARE INSTRUCTIONS   Take all your medicine as prescribed.  Check with your health care provider before starting new prescription or over-the-counter medicines.  Keep all follow up appointments with your health care provider. SEEK MEDICAL CARE IF:   If you are not able to take your medicines as prescribed.  If your symptoms get worse. SEEK IMMEDIATE MEDICAL CARE IF:   You have serious thoughts about hurting yourself or others.   This information is not intended to replace advice given to you by your health care provider. Make sure you discuss any questions you have with your health care provider.   Document Released: 11/03/2006 Document Revised: 07/14/2014 Document Reviewed: 02/04/2013 Elsevier Interactive Patient Education [employer] Please make sure that you attend her group sessions if you're again feeling frustrated, please do not punch a wall or window.  Try counting to Tanner taking some deep breaths

## 2015-04-19 NOTE — ED Notes (Signed)
Pt brought in by [employer] voluntarily. Pt not talking to staff. Per [employer] parents called them to bring pt to ED due to behaviors of punching holes in walls and windows.

## 2015-04-19 NOTE — ED Provider Notes (Addendum)
CSN: [id number]     Arrival date & time 04/19/15  2206 History  By signing my name below, I, Phillis Haggis, attest that this documentation has been prepared under the direction and in the presence of Earley Favor, NP-C. Electronically Signed: Phillis Haggis, ED Scribe. 04/19/2015. 10:54 PM.  No chief complaint on file.  HPI Comments: Increased aggitation   The history is provided by the patient. No language interpreter was used.   HPI Comments: Brandon [name] is a 24 y.o. male with a hx of ADHD, HTN, schizophrenia, and Bipolar 1 disorder brought in by [facility] who presents to the Emergency Department complaining of aggressive behavior. Pt states that he got frustrated earlier today and hit a wall. He denies hurting himself or hurting anyone else in his frustration. Per triage note, [employer] reports that parents called them due to the pt's behaviors of punching holes in walls and windows. Pt states that he has been taking his medication as he is directed. Pt states that he went to group therapy yesterday and will be going again tomorrow. Pt responds to questions after repeated questioning.   Past Medical History  Diagnosis Date  . ADHD (attention deficit hyperactivity disorder)   . Hypertension   . Schizophrenia (HCC)   . Bipolar 1 disorder (HCC)   . 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 Obesity   . Multiple sclerosis (HCC) 05/20/2013    left sided weakness, dysarthria  . Chronic back pain   . Non-compliance   . Chronic neck pain    Past Surgical History  Procedure Laterality Date  . None     Family History  Problem Relation Age of Onset  . Diabetes Mother   . ADD / ADHD Brother    Social History  Substance Use Topics  . Smoking status: Current Every Day Smoker -- 0.25 packs/day    Types: Cigarettes  . Smokeless tobacco: Never Used  . Alcohol Use: No     Comment: sometimes    Review of Systems  Psychiatric/Behavioral: Positive for agitation.  All other systems  reviewed and are negative.     Allergies  Review of patient's allergies indicates no known allergies.  Home Medications   Prior to Admission medications   Medication Sig Start Date End Date Taking? Authorizing Provider  benztropine (COGENTIN) 1 MG tablet Take 1 tablet (1 mg total) by mouth 2 (two) times daily. 04/02/15   Charm Rings, NP  hydrOXYzine (ATARAX/VISTARIL) 25 MG tablet Take 1 tablet (25 mg total) by mouth 3 (three) times daily as needed for anxiety. 04/02/15   Charm Rings, NP  Melatonin 5 MG TABS Take 2 tablets by mouth at bedtime.     Historical Provider, MD  Oxcarbazepine (TRILEPTAL) 600 MG tablet Take 1 tablet (600 mg total) by mouth 2 (two) times daily. 04/02/15   Charm Rings, NP  paliperidone (INVEGA) 6 MG 24 hr tablet Take 1 tablet (6 mg total) by mouth daily. 04/02/15   Charm Rings, NP  QUEtiapine (SEROQUEL) 50 MG tablet Take 1 tablet (50 mg total) by mouth at bedtime. 04/02/15   Charm Rings, NP  traZODone (DESYREL) 50 MG tablet Take 1 tablet (50 mg total) by mouth at bedtime as needed for sleep. 04/02/15   Charm Rings, NP  Vitamin D, Ergocalciferol, (DRISDOL) 50000 UNITS CAPS capsule Take 50,000 Units by mouth every 7 (seven) days.    Historical Provider, MD   BP 126/75 mmHg  Pulse  72  Temp(Src) 98.2 F (36.8 C) (Oral)  Resp 15  SpO2 100% Physical Exam  Constitutional: He appears well-developed and well-nourished.  Eyes: Pupils are equal, round, and reactive to light.  Neck: Normal range of motion.  Cardiovascular: Normal rate and regular rhythm.   Pulmonary/Chest: Effort normal.  Musculoskeletal: Normal range of motion.  Neurological: He is alert.  Skin: Skin is warm.  Psychiatric: He has a normal mood and affect.  Nursing note and vitals reviewed.   ED Course  Procedures (including critical care time) DIAGNOSTIC STUDIES: Oxygen Saturation is 100% on RA, normal by my interpretation.    COORDINATION OF CARE: 10:51 PM-Discussed treatment  plan with pt at bedside and pt agreed to plan.    Labs Review Labs Reviewed - No data to display  Imaging Review No results found. I have personally reviewed and evaluated these images and lab results as part of my medical decision-making.   EKG Interpretation None     Patient is, and cooperative.  Answers questions appropriately, although he is slightly slow in his response.  He is not aggressive.  He is not suicidal or homicidal. [employer] contacted mother who does not want to take out IVC papers.  He will be returned to his home MDM   Final diagnoses:  Schizoaffective disorder, unspecified type (HCC)    I personally performed the services described in this documentation, which was scribed in my presence. The recorded information has been reviewed and is accurate.   Earley Favor, NP 04/19/15 1610  Laurence Spates, MD 04/20/15 1621  Earley Favor, NP 05/26/15 [id number]  Laurence Spates, MD 05/28/15 [phone]

## 2015-04-20 ENCOUNTER — Encounter (HOSPITAL_COMMUNITY): Payer: Self-pay

## 2015-04-20 ENCOUNTER — Emergency Department (HOSPITAL_COMMUNITY)
Admission: EM | Admit: 2015-04-20 | Discharge: 2015-05-03 | Disposition: A | Discharge status: Home / Self Care | Payer: Medicare Other | Attending: Emergency Medicine | Admitting: Emergency Medicine

## 2015-04-20 DIAGNOSIS — I10 Essential (primary) hypertension: Secondary | ICD-10-CM | POA: Insufficient documentation

## 2015-04-20 DIAGNOSIS — F209 Schizophrenia, unspecified: Secondary | ICD-10-CM | POA: Insufficient documentation

## 2015-04-20 DIAGNOSIS — Z79899 Other long term (current) drug therapy: Secondary | ICD-10-CM | POA: Diagnosis not present

## 2015-04-20 DIAGNOSIS — F319 Bipolar disorder, unspecified: Secondary | ICD-10-CM | POA: Insufficient documentation

## 2015-04-20 DIAGNOSIS — G8929 Other chronic pain: Secondary | ICD-10-CM | POA: Diagnosis not present

## 2015-04-20 DIAGNOSIS — Z9119 Patient's noncompliance with other medical treatment and regimen: Secondary | ICD-10-CM | POA: Insufficient documentation

## 2015-04-20 DIAGNOSIS — F259 Schizoaffective disorder, unspecified: Secondary | ICD-10-CM | POA: Diagnosis not present

## 2015-04-20 DIAGNOSIS — F911 Conduct disorder, childhood-onset type: Secondary | ICD-10-CM | POA: Diagnosis present

## 2015-04-20 DIAGNOSIS — Z8669 Personal history of other diseases of the nervous system and sense organs: Secondary | ICD-10-CM | POA: Insufficient documentation

## 2015-04-20 DIAGNOSIS — E669 Obesity, unspecified: Secondary | ICD-10-CM | POA: Diagnosis not present

## 2015-04-20 DIAGNOSIS — Z720 Tobacco use: Secondary | ICD-10-CM | POA: Diagnosis not present

## 2015-04-20 LAB — CBC WITH DIFFERENTIAL/PLATELET
Basophils Absolute: 0.1 10*3/uL (ref 0.0–0.1)
Basophils Relative: 1 %
Eosinophils Absolute: 0.4 10*3/uL (ref 0.0–0.7)
Eosinophils Relative: 6 %
HCT: 40 % (ref 39.0–52.0)
Hemoglobin: 12.4 g/dL — ABNORMAL LOW (ref 13.0–17.0)
Lymphocytes Relative: 36 %
Lymphs Abs: 2.1 10*3/uL (ref 0.7–4.0)
MCH: 25.2 pg — ABNORMAL LOW (ref 26.0–34.0)
MCHC: 31 g/dL (ref 30.0–36.0)
MCV: 81.1 fL (ref 78.0–100.0)
Monocytes Absolute: 0.5 10*3/uL (ref 0.1–1.0)
Monocytes Relative: 8 %
Neutro Abs: 2.9 10*3/uL (ref 1.7–7.7)
Neutrophils Relative %: 49 %
Platelets: 163 10*3/uL (ref 150–400)
RBC: 4.93 MIL/uL (ref 4.22–5.81)
RDW: 15.2 % (ref 11.5–15.5)
WBC: 5.9 10*3/uL (ref 4.0–10.5)

## 2015-04-20 LAB — COMPREHENSIVE METABOLIC PANEL
ALT: 33 U/L (ref 17–63)
AST: 17 U/L (ref 15–41)
Albumin: 4 g/dL (ref 3.5–5.0)
Alkaline Phosphatase: 87 U/L (ref 38–126)
Anion gap: 9 (ref 5–15)
BUN: 15 mg/dL (ref 6–20)
CO2: 25 mmol/L (ref 22–32)
Calcium: 9.1 mg/dL (ref 8.9–10.3)
Chloride: 107 mmol/L (ref 101–111)
Creatinine, Ser: 0.88 mg/dL (ref 0.61–1.24)
GFR calc Af Amer: >60 mL/min (ref >60)
GFR calc non Af Amer: >60 mL/min (ref >60)
Glucose, Bld: 88 mg/dL (ref 65–99)
Potassium: 4 mmol/L (ref 3.5–5.1)
Sodium: 141 mmol/L (ref 135–145)
Total Bilirubin: 0.5 mg/dL (ref 0.3–1.2)
Total Protein: 6.6 g/dL (ref 6.5–8.1)

## 2015-04-20 LAB — ETHANOL: Alcohol, Ethyl (B): <5 mg/dL (ref <5)

## 2015-04-20 LAB — LITHIUM LEVEL: Lithium Lvl: <0.06 mmol/L — ABNORMAL LOW (ref 0.60–1.20)

## 2015-04-20 MED ORDER — TRAZODONE HCL 100 MG PO TABS: 200.0000 mg | ORAL_TABLET | Freq: Every day | ORAL | Status: DC

## 2015-04-20 MED ORDER — QUETIAPINE FUMARATE 50 MG PO TABS
50.0000 mg | ORAL_TABLET | Freq: Every day | ORAL | Status: DC
  Administered 2015-04-20 – 2015-04-21 (×2): 50 mg via ORAL
  Filled 2015-04-20 (×2): qty 1

## 2015-04-20 MED ORDER — BENZTROPINE MESYLATE 1 MG PO TABS
1.0000 mg | ORAL_TABLET | Freq: Two times a day (BID) | ORAL | Status: DC
  Administered 2015-04-20 – 2015-05-03 (×27): 1 mg via ORAL
  Filled 2015-04-20 (×29): qty 1

## 2015-04-20 MED ORDER — PALIPERIDONE ER 6 MG PO TB24
6.0000 mg | ORAL_TABLET | Freq: Every day | ORAL | Status: DC
  Administered 2015-04-20 – 2015-04-26 (×7): 6 mg via ORAL
  Filled 2015-04-20 (×8): qty 1

## 2015-04-20 MED ORDER — AMLODIPINE BESYLATE 10 MG PO TABS
10.0000 mg | ORAL_TABLET | Freq: Every day | ORAL | Status: DC
  Administered 2015-04-20 – 2015-05-03 (×13): 10 mg via ORAL
  Filled 2015-04-20 (×15): qty 1

## 2015-04-20 MED ORDER — HYDROXYZINE HCL 25 MG PO TABS
25.0000 mg | ORAL_TABLET | Freq: Three times a day (TID) | ORAL | Status: DC | PRN
  Administered 2015-04-20 – 2015-05-02 (×9): 25 mg via ORAL
  Filled 2015-04-20 (×11): qty 1

## 2015-04-20 MED ORDER — LORAZEPAM 2 MG/ML IJ SOLN
1.0000 mg | Freq: Once | INTRAMUSCULAR | Status: AC
  Administered 2015-04-20: 1 mg via INTRAMUSCULAR
  Filled 2015-04-20: qty 1

## 2015-04-20 MED ORDER — ZIPRASIDONE MESYLATE 20 MG IM SOLR
20.0000 mg | Freq: Once | INTRAMUSCULAR | Status: AC
  Administered 2015-04-20: 20 mg via INTRAMUSCULAR
  Filled 2015-04-20: qty 20

## 2015-04-20 MED ORDER — OXCARBAZEPINE 300 MG PO TABS
600.0000 mg | ORAL_TABLET | Freq: Two times a day (BID) | ORAL | Status: DC
  Administered 2015-04-20 – 2015-05-03 (×27): 600 mg via ORAL
  Filled 2015-04-20 (×31): qty 2

## 2015-04-20 MED ORDER — NICOTINE 21 MG/24HR TD PT24
21.0000 mg | MEDICATED_PATCH | Freq: Once | TRANSDERMAL | Status: AC
  Administered 2015-04-20: 21 mg via TRANSDERMAL
  Filled 2015-04-20: qty 1

## 2015-04-20 MED ORDER — TRAZODONE HCL 50 MG PO TABS
50.0000 mg | ORAL_TABLET | Freq: Every day | ORAL | Status: DC
  Administered 2015-04-20 – 2015-04-21 (×2): 50 mg via ORAL
  Filled 2015-04-20 (×2): qty 1

## 2015-04-20 NOTE — ED Notes (Signed)
TRANSFERRED WITH Chrissie Noa NT AND SECURITY

## 2015-04-20 NOTE — BH Assessment (Signed)
Writer informed TTS (Regina) of the consult.  

## 2015-04-20 NOTE — ED Notes (Signed)
[employer] SEARCH SELF BEFORE ARRIVAL-PT CLEARED FOR SAFETY

## 2015-04-20 NOTE — ED Notes (Signed)
Delay in lab draw, pt not cooperative at this time. [facility] and security in room.

## 2015-04-20 NOTE — ED Notes (Addendum)
Pt present with IVC papers placed by Father. Respondent has not taken medications. Aggressive behavior (great harm) towards mother and father. Pt resides at home. Pt also has delusions of having spiritual powers. Pt has been found naked with inappropriate touching of himself in public. Pt denies SI at present. Pt presents with [employer] with metal cuffs placed by [employer]. At present metal cuffs appear without causing any restriction.

## 2015-04-20 NOTE — Progress Notes (Signed)
Pt presented to [facility] with flat affect, soft and slow speech with depressed mood. Appears guarded but cooperative with initial assessment. Denied SI, HI, AVH and pain when assessed. Per report, pt was brought in by [employer] in handcuffs after his father called as he was physically aggressive at home. Pt was reportedly kicking and punching holes in walls at home with verbal threats to snap mother's neck, naked in public and masturbating. Pt does have a h/o Schizophrenia. VSS. All medications administered as per MD's orders and pt has been compliant thus far. Verbal redirections provided at intervals for sexual inappropriateness (masturbating).  Safety maintained on Q 15 minutes checks as ordered without outburst or self injurious behavior to report at this time.

## 2015-04-20 NOTE — ED Notes (Signed)
MD EDP POLLINA PRESENT AT BS

## 2015-04-20 NOTE — ED Notes (Signed)
Bed: WU98 Expected date:  Expected time:  Means of arrival:  Comments: Pt w/ [employer]

## 2015-04-20 NOTE — ED Notes (Signed)
REMOVAL OF METAL CUFF BY [employer]. PT COOPERATIVE WITH CARE. PT RESTING WITH EYES CLOSED

## 2015-04-20 NOTE — ED Provider Notes (Signed)
CSN: [id number]     Arrival date & time 04/20/15  1117 History   First MD Initiated Contact with Patient 04/20/15 1114     Chief Complaint  Patient presents with  . Aggressive Behavior  . Medical Clearance  . Homicidal     (Consider location/radiation/quality/duration/timing/severity/associated sxs/prior Treatment) HPI Comments: Patient brought to the emergency department in custody of police. Patient is brought from home where he has allegedly been aggressive. He lives with his parents. He has a history of schizophrenia. He has reportedly been kicking holes in the walls at home and aggressive towards his family. He reportedly feels that he has "powers", is a giant and is going to snap his mother's neck. He is agitated, aggressive at arrival, yelling and will not answer questions appropriately. Level V Caveat due to pyschiatric disorder.   Past Medical History  Diagnosis Date  . ADHD (attention deficit hyperactivity disorder)   . Hypertension   . Schizophrenia (HCC)   . Bipolar 1 disorder (HCC)   . 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 Obesity   . Multiple sclerosis (HCC) 05/20/2013    left sided weakness, dysarthria  . Chronic back pain   . Non-compliance   . Chronic neck pain    Past Surgical History  Procedure Laterality Date  . None     Family History  Problem Relation Age of Onset  . Diabetes Mother   . ADD / ADHD Brother    Social History  Substance Use Topics  . Smoking status: Current Every Day Smoker -- 0.25 packs/day    Types: Cigarettes  . Smokeless tobacco: Never Used  . Alcohol Use: No     Comment: sometimes    Review of Systems  Unable to perform ROS: Psychiatric disorder      Allergies  Review of patient's allergies indicates no known allergies.  Home Medications   Prior to Admission medications   Medication Sig Start Date End Date Taking? Authorizing Provider  benztropine (COGENTIN) 1 MG tablet Take 1 tablet (1 mg total) by  mouth 2 (two) times daily. 04/02/15   Charm Rings, NP  hydrOXYzine (ATARAX/VISTARIL) 25 MG tablet Take 1 tablet (25 mg total) by mouth 3 (three) times daily as needed for anxiety. 04/02/15   Charm Rings, NP  Melatonin 5 MG TABS Take 2 tablets by mouth at bedtime.     Historical Provider, MD  Oxcarbazepine (TRILEPTAL) 600 MG tablet Take 1 tablet (600 mg total) by mouth 2 (two) times daily. 04/02/15   Charm Rings, NP  paliperidone (INVEGA) 6 MG 24 hr tablet Take 1 tablet (6 mg total) by mouth daily. 04/02/15   Charm Rings, NP  QUEtiapine (SEROQUEL) 50 MG tablet Take 1 tablet (50 mg total) by mouth at bedtime. 04/02/15   Charm Rings, NP  traZODone (DESYREL) 50 MG tablet Take 1 tablet (50 mg total) by mouth at bedtime as needed for sleep. 04/02/15   Charm Rings, NP  Vitamin D, Ergocalciferol, (DRISDOL) 50000 UNITS CAPS capsule Take 50,000 Units by mouth every 7 (seven) days.    Historical Provider, MD   BP 152/70 mmHg  Pulse 70  Temp(Src) 98.5 F (36.9 C) (Oral)  Resp 18  SpO2 96% Physical Exam  Constitutional: He is oriented to person, place, and time. He appears well-developed and well-nourished. No distress.  HENT:  Head: Normocephalic and atraumatic.  Right Ear: Hearing normal.  Left Ear: Hearing normal.  Nose: Nose normal.  Mouth/Throat: Oropharynx is clear and moist and mucous membranes are normal.  Eyes: Conjunctivae and EOM are normal. Pupils are equal, round, and reactive to light.  Neck: Normal range of motion. Neck supple.  Cardiovascular: Regular rhythm, S1 normal and S2 normal.  Exam reveals no gallop and no friction rub.   No murmur heard. Pulmonary/Chest: Effort normal and breath sounds normal. No respiratory distress. He exhibits no tenderness.  Abdominal: Soft. Normal appearance and bowel sounds are normal. There is no hepatosplenomegaly. There is no tenderness. There is no rebound, no guarding, no tenderness at McBurney's point and negative Murphy's sign. No  hernia.  Musculoskeletal: Normal range of motion.  Neurological: He is alert and oriented to person, place, and time. He has normal strength. No cranial nerve deficit or sensory deficit. Coordination normal. GCS eye subscore is 4. GCS verbal subscore is 5. GCS motor subscore is 6.  Skin: Skin is warm, dry and intact. No rash noted. No cyanosis.  Psychiatric: His affect is angry and labile. His speech is rapid and/or pressured. He is agitated, aggressive and combative. Thought content is paranoid and delusional.  Nursing note and vitals reviewed.   ED Course  Procedures (including critical care time) Labs Review Labs Reviewed  CBC WITH DIFFERENTIAL/PLATELET - Abnormal; Notable for the following:    Hemoglobin 12.4 (*)    MCH 25.2 (*)    All other components within normal limits  COMPREHENSIVE METABOLIC PANEL  ETHANOL  URINE RAPID DRUG SCREEN, HOSP PERFORMED    Imaging Review No results found. I have personally reviewed and evaluated these images and lab results as part of my medical decision-making.   EKG Interpretation None      MDM   Final diagnoses:  None   schizophrenia  Patient with previous diagnosis of schizophrenia presents to the ER severely agitated, aggressive and combative. He required Geodon and Ativan at arrival because of his combative behavior which was requiring police officers to hold him down. Lab work unremarkable, patient medically clear for psychiatric evaluation. Currently, however, he has sedated after medication, will need to wait until he is more alert before consultation can be performed.    [name], MD 04/20/15 1324

## 2015-04-20 NOTE — BH Assessment (Addendum)
Assessment Note  Brandon [name] is an 24 y.o. male who was transported to [facility] by [employer] after his father took out IVC papers on him.   Patient has a history of psychosis and inappropriate sexual behaviors.  He was last at [facility] in September with active psychosis and voices telling him to kill his mother.  He had been violent in the home and was IVC at that time.    Per IVC paperwork patient "does not take his medication every day.. continually knocks holes in the walls of the residence and breaks other items..walks outside naked, exposing himself...has also walked in front of his mother while allegedly playing with himself.  He also has daily hallucinations...believes that he has spiritual power. He also thinks that he is a strong giant that can snap his mother's neck and anyone else.Marland Kitchenalso smokes marijuana and uses other drugs. Appears to be a danger to himself and other."  According to patient medical record when he was brought to the ED by [employer] he was agitated and aggressive and was given Geodon.  When this examiner met with patient he was not able to answer many questions and would trail off while speaking. Some of the information provided in this assessment was taken from patient's previous assessment in September.   Consulted with NP Manus Gunning who recommended in patient treatment  Diagnosis: 295.90 Schizophrenia                     Deferred                     See below                     Problems with primary support group, educational problems, occupational problems,                     21-30 Behavior is considerable influenced by delusions or hallucinations   Past Medical History:  Past Medical History  Diagnosis Date  . ADHD (attention deficit hyperactivity disorder)   . Hypertension   . Schizophrenia ([place])   . Bipolar 1 disorder ([place])   . 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 Obesity   . Multiple sclerosis ([place]) 05/20/2013    left sided weakness, dysarthria  .  Chronic back pain   . Non-compliance   . Chronic neck pain     Past Surgical History  Procedure Laterality Date  . None      Family History:  Family History  Problem Relation Age of Onset  . Diabetes Mother   . ADD / ADHD Brother     Social History:  reports that he has been smoking Cigarettes.  He has been smoking about 0.25 packs per day. He has never used smokeless tobacco. He reports that he uses illicit drugs (Marijuana). He reports that he does not drink alcohol.  Additional Social History:  Alcohol / Drug Use Pain Medications:  (see medical record) Prescriptions:  (see medical record) Over the Counter:  (see medical record) History of alcohol / drug use?: No history of alcohol / drug abuse  CIWA: CIWA-Ar BP: 131/81 mmHg Pulse Rate: 64 COWS:    Allergies: No Known Allergies  Home Medications:  (Not in a hospital admission)  OB/GYN Status:  No LMP for male patient.  General Assessment Data Location of Assessment: [facility] TTS Assessment: In system Is this a Tele or Face-to-Face Assessment?: Face-to-Face Is this  an Initial Assessment or a Re-assessment for this encounter?: Initial Assessment Marital status: Single Maiden name:  (n/a) Is patient pregnant?: No Pregnancy Status: No Living Arrangements: Parent Can pt return to current living arrangement?: Yes Admission Status: Involuntary Is patient capable of signing voluntary admission?: No Referral Source: Self/Family/Friend Insurance type:  Passenger transport manager)  Medical Screening Exam ([place]) Medical Exam completed: Yes  Crisis Care Plan Living Arrangements: Parent Name of Psychiatrist:  (unknown) Name of Therapist:  (unknown)  Education Status Is patient currently in school?: No Current Grade:  (n/a) Highest grade of school patient has completed: unknown (n/a) Name of school:  (unknown) Contact person:  (n/a)  Risk to self with the past 6 months Suicidal Ideation:  (states no) Has patient been  a risk to self within the past 6 months prior to admission? : Yes Suicidal Intent:  (unknown) Has patient had any suicidal intent within the past 6 months prior to admission? : No Is patient at risk for suicide?: No, but patient needs Medical Clearance Suicidal Plan?: No Has patient had any suicidal plan within the past 6 months prior to admission? : No Access to Means: No What has been your use of drugs/alcohol within the last 12 months?:  (unknown) Previous Attempts/Gestures: Yes How many times?:  (2) Other Self Harm Risks:  (unknown) Triggers for Past Attempts: Unknown Intentional Self Injurious Behavior: None Family Suicide History: No Recent stressful life event(s): Conflict (Comment) Persecutory voices/beliefs?: No Depression: No Depression Symptoms: Despondent Substance abuse history and/or treatment for substance abuse?: No Suicide prevention information given to non-admitted patients: Not applicable  Risk to Others within the past 6 months Homicidal Ideation: Yes-Currently Present Does patient have any lifetime risk of violence toward others beyond the six months prior to admission? : Yes (comment) Thoughts of Harm to Others: Yes-Currently Present Comment - Thoughts of Harm to Others:  (stated no) Current Homicidal Intent: Yes-Currently Present Current Homicidal Plan: Yes-Currently Present Describe Current Homicidal Plan:  (wants to snap his mothers neck) Access to Homicidal Means: Yes Describe Access to Homicidal Means:  (himself) Identified Victim:  (mother) History of harm to others?: No Assessment of Violence: On admission Violent Behavior Description:  (aggressive at arrival and yelling) Does patient have access to weapons?: No Criminal Charges Pending?:  (unknown) Does patient have a court date:  (unknown) Is patient on probation?:  (unknown)  Psychosis Hallucinations: None noted Delusions: None noted  Mental Status Report Appearance/Hygiene: In scrubs Eye  Contact: Poor Motor Activity: Psychomotor retardation Speech: Incoherent Level of Consciousness: Drowsy Mood: Empty Affect: Flat Anxiety Level: Minimal Thought Processes: Irrelevant Judgement: Impaired Orientation: Person, Place, Time, Situation Obsessive Compulsive Thoughts/Behaviors: None  Cognitive Functioning Concentration: Decreased Memory: Recent Impaired, Remote Impaired IQ: Below Average Level of Function:  (below average) Insight: Poor Impulse Control: Poor Appetite: Good Weight Loss:  (o0) Weight Gain:  (0) Sleep: Unable to Assess Total Hours of Sleep:  (unable to assess) Vegetative Symptoms: Unable to Assess  ADLScreening [facility] Assessment Services) Patient's cognitive ability adequate to safely complete daily activities?: No Patient able to express need for assistance with ADLs?: Yes Independently performs ADLs?: Yes (appropriate for developmental age)  Prior Inpatient Therapy Prior Inpatient Therapy: Yes Prior Therapy Dates:  (September 2016 June 2016 January 2016) Prior Therapy Facilty/Provider(s):  [facility]) Reason for Treatment:  (Psychosis)  Prior Outpatient Therapy Prior Outpatient Therapy: No Prior Therapy Dates:  (n/a) Prior Therapy Facilty/Provider(s):  (n/a) Reason for Treatment:  (n/a) Does patient have an ACCT team?: Yes  Does patient have Intensive In-House Services?  : No Does patient have [facility] services? : No Does patient have P4CC services?: No  ADL Screening (condition at time of admission) Patient's cognitive ability adequate to safely complete daily activities?: No Is the patient deaf or have difficulty hearing?: No Does the patient have difficulty seeing, even when wearing glasses/contacts?: No Does the patient have difficulty concentrating, remembering, or making decisions?: Yes Patient able to express need for assistance with ADLs?: Yes Does the patient have difficulty dressing or bathing?: No Independently performs ADLs?: Yes  (appropriate for developmental age) Does the patient have difficulty walking or climbing stairs?: No Weakness of Legs: None Weakness of Arms/Hands: None  Home Assistive Devices/Equipment Home Assistive Devices/Equipment: None  Therapy Consults (therapy consults require a physician order) PT Evaluation Needed: No OT Evalulation Needed: No SLP Evaluation Needed: No Abuse/Neglect Assessment (Assessment to be complete while patient is alone) Physical Abuse: Denies Verbal Abuse: Denies Sexual Abuse: Denies Exploitation of patient/patient's resources: Denies Self-Neglect: Denies Values / Beliefs Cultural Requests During Hospitalization: None Spiritual Requests During Hospitalization: None Consults Spiritual Care Consult Needed: No Social Work Consult Needed: No Regulatory affairs officer (For Healthcare) Does patient have an advance directive?: No Would patient like information on creating an advanced directive?: No - patient declined information    Additional Information 1:1 In Past 12 Months?:  (unknown) CIRT Risk: Yes Elopement Risk: Yes Does patient have medical clearance?: Yes     Disposition:  Disposition Initial Assessment Completed for this Encounter: Yes Disposition of Patient: Inpatient treatment program Type of inpatient treatment program: Adult  On Site Evaluation by:   Reviewed with Physician:    Carlean Jews 04/20/2015 4:01 PM

## 2015-04-20 NOTE — ED Notes (Signed)
Pt accepted medication with needing aggressive assistance. Pt calm and cooperative with medication delivery. [employer] present and metal cuffs remain to ensure safety however pt verbalized agreement. Pt continues to remains passive

## 2015-04-20 NOTE — ED Notes (Signed)
Patient and room 41 got in a verbal altercation. Patient anxious and agitated at this time. Patient redirected to his room will medicate patient with bedtime medication and prn medication for anxiety. Safety maintain and encouragement and support provided. Q 15 min safety check remain in place.

## 2015-04-20 NOTE — Progress Notes (Signed)
Patient was referred at:  [facility] - per intake, fax referral for review. [facility] - per Julio Alm, fax referral, beds open. [facility] - per [place], 1 bed, fax referral. [facility] - per Wandalee Ferdinand, accepting referrals. [facility] - per intake, fax referral for the waitlist. [facility] - left voicemail. [facility] - per [place], 1 male geriatric bed, fax referral for the waitlist. [facility] - per intake, fax referral.  At capacity: [facility] - per [employer]. [facility] - per [facility] - per Graciela Husbands, LCSWA Disposition staff 04/20/2015 7:52 PM

## 2015-04-20 NOTE — ED Notes (Signed)
Pt aggressive and acting out towards staff and [employer]. EDP Pollina witnessing pt's behavior. Medications ordered for pt.

## 2015-04-21 DIAGNOSIS — F201 Disorganized schizophrenia: Secondary | ICD-10-CM | POA: Insufficient documentation

## 2015-04-21 DIAGNOSIS — F209 Schizophrenia, unspecified: Secondary | ICD-10-CM | POA: Insufficient documentation

## 2015-04-21 DIAGNOSIS — F259 Schizoaffective disorder, unspecified: Secondary | ICD-10-CM | POA: Diagnosis not present

## 2015-04-21 LAB — RAPID URINE DRUG SCREEN, HOSP PERFORMED
Amphetamines: NOT DETECTED
Barbiturates: NOT DETECTED
Benzodiazepines: NOT DETECTED
Cocaine: NOT DETECTED
Opiates: NOT DETECTED
Tetrahydrocannabinol: NOT DETECTED

## 2015-04-21 MED ORDER — LORAZEPAM 1 MG PO TABS: 2.0000 mg | ORAL_TABLET | Freq: Once | ORAL | Status: AC

## 2015-04-21 MED ORDER — LORAZEPAM 2 MG/ML IJ SOLN
2.0000 mg | Freq: Once | INTRAMUSCULAR | Status: AC
  Administered 2015-04-21: 2 mg via INTRAMUSCULAR
  Filled 2015-04-21: qty 1

## 2015-04-21 MED ORDER — HALOPERIDOL LACTATE 5 MG/ML IJ SOLN
5.0000 mg | Freq: Four times a day (QID) | INTRAMUSCULAR | Status: DC | PRN
  Administered 2015-04-21: 5 mg via INTRAMUSCULAR
  Filled 2015-04-21: qty 1
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
## 2015-04-21 NOTE — Progress Notes (Signed)
10:49am. Dr. Jama Flavors upholds pt's IVC. CSW faxed and filed paperwork for [employer]  [facility] Clinical Social Worker [facility] phone: [phone]
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
## 2015-04-21 NOTE — ED Notes (Signed)
Patient denies SI, HI and AVH at this time. Plan of care discussed with patient. Patient voices no complaints or concerns at this time. Q 15 min safety checks remain in place.

## 2015-04-21 NOTE — ED Notes (Signed)
Patient appears flat. States depression is 8/10. Reports breaking up with a girlfriend a year ago. Denies any changes or issues with appetite or sleep habits. Denies SI, HI, reports seeing "an angel". Reports pain left hip.   Encouragement offered. Given heating pad, snack. Reminded of fall safety precautions.  Q 15 safety checks continue.

## 2015-04-22 DIAGNOSIS — F209 Schizophrenia, unspecified: Secondary | ICD-10-CM | POA: Diagnosis not present

## 2015-04-22 MED ORDER — TRAZODONE HCL 100 MG PO TABS
100.0000 mg | ORAL_TABLET | Freq: Every day | ORAL | Status: DC
  Administered 2015-04-22 – 2015-04-27 (×6): 100 mg via ORAL
  Filled 2015-04-22 (×6): qty 1

## 2015-04-22 MED ORDER — LORAZEPAM 1 MG PO TABS
2.0000 mg | ORAL_TABLET | Freq: Three times a day (TID) | ORAL | Status: DC | PRN
  Administered 2015-04-22 – 2015-05-02 (×15): 2 mg via ORAL
  Filled 2015-04-22 (×15): qty 2

## 2015-04-22 MED ORDER — NICOTINE POLACRILEX 2 MG MT GUM
2.0000 mg | CHEWING_GUM | OROMUCOSAL | Status: DC | PRN
  Administered 2015-04-22 (×2): 2 mg via ORAL
  Filled 2015-04-22 (×2): qty 1

## 2015-04-22 NOTE — ED Notes (Signed)
Pt AAO x 3, no distress noted, masturbating at present, refusing to stop.  Door closed, pt covered. Monitoring for safety, Q 15 min checks in effect.

## 2015-04-22 NOTE — Progress Notes (Signed)
1:36pm. Per Junious Dresser, pt accepted to [facility] waitlist.  York Spaniel [facility] Clinical Social Worker [facility] phone: [phone]

## 2015-04-22 NOTE — Progress Notes (Signed)
12:18pm. [facility] application and demographics interview completed. Auth number: [id number]. Awaiting approval to waitlist. CSW to continue to follow.  York Spaniel [facility] Clinical Social Worker [facility] phone: [phone]

## 2015-04-23 DIAGNOSIS — F209 Schizophrenia, unspecified: Secondary | ICD-10-CM | POA: Diagnosis not present

## 2015-04-23 MED ORDER — ZIPRASIDONE MESYLATE 20 MG IM SOLR
INTRAMUSCULAR | Status: AC
  Administered 2015-04-23: 20 mg
  Filled 2015-04-23: qty 20

## 2015-04-23 MED ORDER — ZIPRASIDONE MESYLATE 20 MG IM SOLR: 20.0000 mg | Freq: Once | INTRAMUSCULAR | Status: AC

## 2015-04-23 MED ORDER — LORAZEPAM 2 MG/ML IJ SOLN
2.0000 mg | Freq: Once | INTRAMUSCULAR | Status: AC
  Administered 2015-04-23: 2 mg via INTRAMUSCULAR
  Filled 2015-04-23: qty 1

## 2015-04-23 MED ORDER — STERILE WATER FOR INJECTION IJ SOLN
INTRAMUSCULAR | Status: AC
  Administered 2015-04-23: 10 mL
  Filled 2015-04-23: qty 10

## 2015-04-23 NOTE — ED Notes (Addendum)
Patient has been up and pacing the halls much of the day.  He has been loud and frequently seen responding to internal stimuli.  He has also been hitting on walls and windows.  He has been masturbating in his room and in the dayroom.  He has been asked to please not put his hands into his pants while in the dayroom.  Patient was unable to calm down on his own and his behaviors continued to escalate  Spoke with Celso Amy, DNP.  Orders received for ziprasidone 20 and lorazepam 2 mg IM.  Both medications were given and patient is currently resting quietly.

## 2015-04-23 NOTE — Progress Notes (Signed)
ED CM noted pt running from near room 38 into [facility] double doors near [place] nursing station.  Pt redirected by [facility] staff [employer] present

## 2015-04-23 NOTE — ED Notes (Signed)
Pt incontinent of urine x 1, comfort measures given.    

## 2015-04-23 NOTE — BH Assessment (Signed)
Reassessment 04/23/2015:  Clinician met with patient 1:1 to complete reassessment. Patient was observed to be lying down in bed and provided limited eye contact. Patient states that he was admitted due to anger issues and punching holes in the wall because he was upset. Patient reports that currently he does not want to hurt himself or others. Patient denies hearing voices today but does state that they come and go. Patient states that he is unable to control his anger at times but is hoping to "do better". Insight remains limited at this time as patient minimizes his behaviors that led to his ED admission.   Writer discussed the above information with Dr. [name] and Waylan Boga, DNP. The psychiatry team continues to recommend inpatient placement. Patient also on the [facility] wait list.

## 2015-04-23 NOTE — BH Assessment (Signed)
[facility] Assessment Progress Note  At 09:20 Sonya at [facility] reports that this pt remains on their wait list.  Adis Sturgill, MA Triage Specialist [phone]     

## 2015-04-23 NOTE — ED Notes (Signed)
Patient observed sitting in dayroom watching television. He is calm and cooperative. He denies SI Hi and AVH. He expressed no concerns at this time. When asked why he was here he stated that " the police came to my door and brought me here" Patient is currently watching television and is comfortable. Q 15 minute checks maintained and in progress and monitoring continues.

## 2015-04-24 DIAGNOSIS — F259 Schizoaffective disorder, unspecified: Secondary | ICD-10-CM | POA: Diagnosis not present

## 2015-04-24 DIAGNOSIS — F209 Schizophrenia, unspecified: Secondary | ICD-10-CM | POA: Diagnosis not present

## 2015-04-24 MED ORDER — HALOPERIDOL 5 MG PO TABS
5.0000 mg | ORAL_TABLET | Freq: Three times a day (TID) | ORAL | Status: DC | PRN
  Administered 2015-04-24 – 2015-05-02 (×9): 5 mg via ORAL
  Filled 2015-04-24 (×9): qty 1

## 2015-04-24 NOTE — Progress Notes (Signed)
Pt alert, oriented to self and place. Denied SI, HI, AVH and pain when assessed. Pt observed actively responding to internal stimuli--inappropriate laughter and talking to self while looking at walls as if talking with someone; in his room and in halls. Spontaneous running and yelling at intervals. All scheduled medications administered as per MD's order including PRN Haldol 5 mg PO and PRN Ativan 2 mg PO for agitation with minimal effect. Requires frequent verbal redirections to stop masturbating in hall and to wear scrub pants appropriately. Pt encouraged to do his ADLs but to no avail. Pt tolerates meals and fluids well. Gait is slow and unsteady at times but without falls event so far this shift. Q 15 minutes checks maintained for safety.

## 2015-04-24 NOTE — Consult Note (Signed)
  Psychiatric Specialty Exam: Physical Exam  ROS  Blood pressure 133/76, pulse 70, temperature 98.1 F (36.7 C), temperature source Oral, resp. rate 18, SpO2 99 %.There is no weight on file to calculate BMI. General Appearance: Casual  Eye Contact:: Good  Speech: Normal Rate  Volume: Normal  Mood: Irritable  Affect: Congruent, Labile and punched wall today, yelled out once for no reason.  Thought Process: Disorganized and Tangential  Orientation: Full (Time, Place, and Person)  Thought Content: WDL  Suicidal Thoughts: No  Homicidal Thoughts: No  Memory: Immediate; Fair Recent; Fair Remote; Fair  Judgement: Poor  Insight: Shallow  Psychomotor Activity: Restlessness  Concentration: Poor  Recall: NA  Fund of Knowledge:Poor  Language: Fair  Akathisia: No  Handed: Right  AIMS (if indicated):    Assets: Desire for Improvement  ADL's: Intact  Cognition: WNL         Patient was seen in his room masturbating.  He is constantly doing so even in the hallway.  He is allowing his pants fall off him and needed directing to pull pants up.  Patient is responding to internal stimuli talking and laughing.  Patient is not taking care of his ADL but is eating and drinking.  He is not speaking to staff unless he need food.  He paces from his room to the hall way and back to his room.  We will continue to seek placement at any facility with available beds.  Patient is in the waiting list for [facility] admission.  Schizoaffective disorder, unspecified type (HCC)   Plan: [facility] Placement  Dahlia Byes   PMHNP-BC Patient seen face-to-face for psychiatric evaluation, chart reviewed and case discussed with the physician extender and developed treatment plan. Reviewed the information documented and agree with the treatment plan. Thedore Mins, MD

## 2015-04-24 NOTE — BH Assessment (Signed)
[facility] Assessment Progress Note  At 08:29 Robinette at [facility] reports that this pt remains on their wait list.  Doylene Canning, MA Triage Specialist [phone]

## 2015-04-24 NOTE — ED Notes (Signed)
Patient observed in room on stretcher. He was talking to himself when approached. He states he was not talking to anyone when asked who he was talking with. Patient denies AVH; however he has been observed to be preoccupied internally. He sometimes laughs and curses to himself as well. Patient denies SI and HI. He was offered to take a shower due to poor hygiene but declined stated he had taken one already will offer patient the opportunity again to shower at a later time. Patient is currently resting in room on stretcher with eyes opened. Q 15 minute checks in progress and maintained and monitoring continues.

## 2015-04-25 DIAGNOSIS — F209 Schizophrenia, unspecified: Secondary | ICD-10-CM | POA: Diagnosis not present

## 2015-04-25 MED ORDER — MAGNESIUM HYDROXIDE 400 MG/5ML PO SUSP
30.0000 mL | Freq: Every day | ORAL | Status: DC | PRN
  Filled 2015-04-25: qty 30

## 2015-04-25 MED ORDER — DOCUSATE SODIUM 100 MG PO CAPS
100.0000 mg | ORAL_CAPSULE | Freq: Every day | ORAL | Status: DC
  Administered 2015-04-25 – 2015-05-03 (×9): 100 mg via ORAL
  Filled 2015-04-25 (×11): qty 1
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
## 2015-04-25 NOTE — BH Assessment (Signed)
[facility] Assessment Progress Note  At 09:21 Robinette at [facility] reports that this pt remains on their wait list.  Doylene Canning, MA Triage Specialist [phone]

## 2015-04-26 DIAGNOSIS — F209 Schizophrenia, unspecified: Secondary | ICD-10-CM | POA: Diagnosis not present

## 2015-04-26 MED ORDER — PALIPERIDONE ER 6 MG PO TB24
9.0000 mg | ORAL_TABLET | Freq: Every day | ORAL | Status: DC
  Administered 2015-04-27 – 2015-05-03 (×7): 9 mg via ORAL
  Filled 2015-04-26 (×8): qty 1

## 2015-04-26 MED ORDER — ZIPRASIDONE MESYLATE 20 MG IM SOLR
20.0000 mg | Freq: Once | INTRAMUSCULAR | Status: AC
  Administered 2015-04-26: 20 mg via INTRAMUSCULAR
  Filled 2015-04-26: qty 20

## 2015-04-26 MED ORDER — DIPHENHYDRAMINE HCL 50 MG/ML IJ SOLN
50.0000 mg | Freq: Once | INTRAMUSCULAR | Status: AC
  Administered 2015-04-26: 50 mg via INTRAMUSCULAR
  Filled 2015-04-26: qty 1

## 2015-04-26 MED ORDER — LORAZEPAM 2 MG/ML IJ SOLN
2.0000 mg | Freq: Once | INTRAMUSCULAR | Status: AC
  Administered 2015-04-26: 2 mg via INTRAMUSCULAR
  Filled 2015-04-26: qty 1

## 2015-04-26 MED ORDER — DIPHENHYDRAMINE HCL 50 MG/ML IJ SOLN
50.0000 mg | Freq: Once | INTRAMUSCULAR | Status: AC
  Administered 2015-04-26: 50 mg via INTRAVENOUS
  Filled 2015-04-26: qty 1

## 2015-04-26 NOTE — BH Assessment (Signed)
Patient was reassessed by TTS.   Patient met with this Probation officer in the hallway. Patient states that "this is a great facility but i want to get out of here." patient denies SI and HI at this time. Patient states that he does not "hear voices in my head, I just think that people on the TV are talking to me." Patient states "it's tv's everywhere" and states that when he is home he has went outside several times because he thought that the person on the television was speaking to him. Patient states that he has realized that the television was not talking to him once he was outside. Patient states that he is on "Haldol and I sleep good." Patient states that he is eating his meals as well. Patient states that he does not have any questions or concerns at this time.   Patient continues to meet inpatient criteria per Dr. [name]. Patient remains on the [facility] waiting list.

## 2015-04-26 NOTE — ED Notes (Signed)
Patient denies SI, HI and AVH at this time. Plan of care discussed with patient. Patient voices no complaints or concerns at this time. Encouragement and support provided and safety maintain. Q 15 min safety checks remain in place.  

## 2015-04-26 NOTE — ED Notes (Signed)
Patient has slept most of the day.  He has been incontinent of urine twice and needed assistance to shower after the first incident.  He was calm and cooperative on awakening.  He was given a snack and a drink and is currently in the dayroom with peers.

## 2015-04-26 NOTE — BH Assessment (Signed)
[facility] Assessment Progress Note  At 08:44 Sonya at [facility] reports that pt remains on their wait list.  Doylene Canning, MA Triage Specialist [phone]

## 2015-04-26 NOTE — ED Notes (Signed)
Patient has been pacing, banging into and punching doors, punching windows and making threatening gestures.  He has been actively responding to internal stimuli by laughing inappropriately and talking to himself.  He has been argumentative with staff.  I had two conversations with the patient about his behavior and informed him that his negative behavior would have consequences if he did not get them under control.  He continued yelling and banging on windows and walls.  I spoke with Dr. Jannifer Franklin and received orders for medications.  Security was called and injections were given as ordered.  Patient was resistant at first, but did finally comply.  He is currently resting quietly on his bed.

## 2015-04-27 DIAGNOSIS — F209 Schizophrenia, unspecified: Secondary | ICD-10-CM | POA: Diagnosis not present

## 2015-04-27 MED ORDER — NICOTINE 14 MG/24HR TD PT24
14.0000 mg | MEDICATED_PATCH | Freq: Once | TRANSDERMAL | Status: AC
  Administered 2015-04-27: 14 mg via TRANSDERMAL
  Filled 2015-04-27 (×2): qty 1

## 2015-04-27 NOTE — BH Assessment (Signed)
[facility] Assessment Progress Note  At 09:35 Junious Dresser at [facility] reports that pt remains on their wait list.  Doylene Canning, MA Triage Specialist [phone]

## 2015-04-27 NOTE — ED Notes (Signed)
Patient is denying SI/HI, A/V hallucinations, however appears to be responding to internal stimuli at times and has started banging his head with his fists. Patient redirected to his room, encouraged to calm himself down, and took prn medication.  Virdie Penning, Wyman Songster, RN

## 2015-04-27 NOTE — ED Notes (Signed)
Pt. Noted in room. No complaints or concerns voiced. No acute distress noted. Will continue to monitor with security cameras. Q 15 minute rounds continue. 

## 2015-04-27 NOTE — ED Notes (Signed)
Patient moved to room 36 due to conflicts with another patient on the hall.  Yena Tisby, Wyman Songster, RN

## 2015-04-27 NOTE — ED Notes (Signed)
Pt. Noted in day room. No complaints or concerns voiced. No distress or abnormal behavior noted. Will continue to monitor with security cameras. Q 15 minute rounds continue. 

## 2015-04-27 NOTE — ED Notes (Signed)
Pt. Noted in day room. No complaints or concerns voiced. No acute distress noted. Will continue to monitor with security cameras. Q 15 minute rounds continue.

## 2015-04-27 NOTE — ED Notes (Signed)
Patient was found in the dayroom with his hands down his pants, was asked 3 times to remove his hands from his pants in the past. Patient was told he needed to stay out of the dayroom from now on. Patient stated he was adjusting himself but was agreeable to stay out of the dayroom.  Ailani Governale, Wyman Songster, RN

## 2015-04-27 NOTE — [facility] Counselor (Signed)
Pt calm and cooperative in his room watching TV. He states "it's boring in here". He denies SI, HI, A/VH at this time. No complaints and states he does not need anything from staff. Pt oriented and alert. Counselor offered support.   [name], M.S., LPCA, [place], [facility] Licensed Professional Counselor Associate  Triage Specialist  [facility]  Therapeutic Triage Services Phone: [phone] Fax: [phone]

## 2015-04-27 NOTE — ED Notes (Signed)
Pt. C/o anxiety and insomnia. 

## 2015-04-27 NOTE — BH Assessment (Signed)
[facility] Assessment Progress Note  Thedore Mins, MD has renewed IVC for this pt today.  Documents have been faxed to the [facility] [place], and at [employer] confirmed receipt.  Doylene Canning, MA Triage Specialist [phone]

## 2015-04-27 NOTE — ED Notes (Signed)
Report received from [facility]. Pt. Alert in no acute distress denies SI, HI, AVH and pain.  Pt. Instructed to come to me with problems or concerns.Will continue to monitor for safety via security cameras and Q 15 minute checks.

## 2015-04-28 DIAGNOSIS — F209 Schizophrenia, unspecified: Secondary | ICD-10-CM | POA: Diagnosis not present

## 2015-04-28 LAB — BASIC METABOLIC PANEL
Anion gap: 5 (ref 5–15)
BUN: 20 mg/dL (ref 6–20)
CO2: 27 mmol/L (ref 22–32)
Calcium: 9.2 mg/dL (ref 8.9–10.3)
Chloride: 104 mmol/L (ref 101–111)
Creatinine, Ser: 1.02 mg/dL (ref 0.61–1.24)
GFR calc Af Amer: >60 mL/min (ref >60)
GFR calc non Af Amer: >60 mL/min (ref >60)
Glucose, Bld: 99 mg/dL (ref 65–99)
Potassium: 4 mmol/L (ref 3.5–5.1)
Sodium: 136 mmol/L (ref 135–145)

## 2015-04-28 MED ORDER — NICOTINE 14 MG/24HR TD PT24
14.0000 mg | MEDICATED_PATCH | Freq: Every day | TRANSDERMAL | Status: DC
  Administered 2015-04-28: 14 mg via TRANSDERMAL

## 2015-04-28 MED ORDER — TRAZODONE HCL 50 MG PO TABS
50.0000 mg | ORAL_TABLET | Freq: Every day | ORAL | Status: DC
  Administered 2015-04-28 – 2015-04-29 (×2): 50 mg via ORAL
  Filled 2015-04-28 (×2): qty 1

## 2015-04-28 NOTE — ED Notes (Signed)
Pt. Noted sleeping in room. No complaints or concerns voiced. No distress or abnormal behavior noted. Will continue to monitor with security cameras. Q 15 minute rounds continue. 

## 2015-04-28 NOTE — ED Notes (Signed)
Pt. Noted in hall. No complaints or concerns voiced. No acute distress noted. Will continue to monitor with security cameras. Q 15 minute rounds continue. 

## 2015-04-28 NOTE — ED Notes (Addendum)
Pt sitting in day room intermittantly yelling and talking to unknown person.  Pt instructed not to yell.  Pt verbalized understanding

## 2015-04-28 NOTE — Progress Notes (Signed)
3:37pm. Per Robinette, pt remains on [facility] waitlist.  York Spaniel [facility] Clinical Social Worker [facility] phone: [phone]

## 2015-04-28 NOTE — ED Notes (Signed)
Snack of graham crackers, cheese and beverage given.

## 2015-04-28 NOTE — ED Notes (Signed)
Pt. Noted in room. No complaints or concerns voiced. No distress or abnormal behavior noted. Will continue to monitor with security cameras. Q 15 minute rounds continue. 

## 2015-04-28 NOTE — ED Notes (Signed)
Pt more animated,talking/laughing more and louder, banged on exit doors, redirectable but becoming more agitated.

## 2015-04-28 NOTE — ED Notes (Signed)
Report received from Janie Rambo RN. Pt. Alert and oriented in no distress denies SI, HI, AVH and pain.  Pt. Instructed to come to me with problems or concerns.Will continue to monitor for safety via security cameras and Q 15 minute checks. 

## 2015-04-28 NOTE — ED Notes (Signed)
Patient observed staggering at advanced pace to restroom. Had episode of urinary incontinence. Patient appeared very weak and drowsy.Patient had trouble moving to and sitting on shower seat.  Assisted patient in removing soiled clothing.   Provided with shower supplies. Encouraged to shower and call for assistance.

## 2015-04-28 NOTE — ED Notes (Signed)
Lab into draw, pt tolerated well 

## 2015-04-28 NOTE — ED Notes (Signed)
Pt ate 100% of his lunch

## 2015-04-28 NOTE — ED Notes (Signed)
Pt. Had difficulty making it to the restroom before he experienced urinary incontinence. Pt. To shower with assistance. Pt. Stated he "wished he could just die".

## 2015-04-28 NOTE — BH Assessment (Signed)
Patient was reassessed by TTS in the day room. Patient denies SI/HI and AVH. Patient states "whatever medicine y'all put me on stopped all of that, I'm fine now." Patient was laughing throughout the assessment and appeared to be responding to internal stimuli. Patient was informed that his mother called earlier and informed that he could use the phone if he would like to call her back. Patient was informed that if he could not remember the phone number he could ask the nurse for the phone number.  Patient meets inpatient criteria per Dr. Jannifer Franklin. Patient has been referred to [facility].

## 2015-04-28 NOTE — ED Notes (Signed)
Sitting quielty in [employer]

## 2015-04-28 NOTE — ED Notes (Signed)
Sitting quietly in dayroom watching tv

## 2015-04-28 NOTE — ED Notes (Addendum)
Dr Donnald Garre here to eval

## 2015-04-28 NOTE — ED Notes (Signed)
.  .  .        xx

## 2015-04-28 NOTE — ED Notes (Signed)
PT up to the bathroom, pt incont of urine, linens/scrubs changed.

## 2015-04-28 NOTE — ED Notes (Signed)
Pt. Having difficulty dressing himself after shower. Weakness of Left arm and leg noted. Assisted patient to dress and ambulate back to his room. Pt. Instructed to call for assistance as needed.

## 2015-04-28 NOTE — ED Notes (Signed)
Pt's mother called concerned that she had not been contacted by anyone.  She reports that she is the patients court appointed guardian and that the papers were brought in during his last admission.  CSW spoke with her.

## 2015-04-28 NOTE — ED Provider Notes (Addendum)
I was asked by nursing to evaluate the patient for increasing left sided weakness and urinary incontinence. They felt that the patient was having difficulty with ADLs such as dressing and self bathing. This reportedly has been developing for the past 2-1/2 days. He has not had vital sign instability.  I reviewed the patient's medical chart and identify that he has had a diagnosis of MS for several years. The predominant symptom is left-sided weakness and dysfunction with falling. MRI had been repeated last month and did not show apparent active demyelinating disease. The patient reports he gets a monthly injection for his MS and he already had that this month.  The patient was hospitalized in a highly agitated and aggressive state requiring significant sedation in antipsychotic medications. When I went back to evaluate the patient he was up walking back and forth in the hallways. He appears medically sedated but greeted me in the hall and answered my questions appropriately. He does not note that he is specifically more weak than he has been in the past. He performed bilateral grip strength for me to which I could only note minimal decrease on the left from the right. He also did upper extremity push and pull with which he was very strong. He shifted weight from the right to the left leg in a standing position for me. He denied significant sensory difference in light touch from left to right. He performed cranial nerve exam without apparent anomaly. The patient does appear to be a fall risk. As he stands and walks, he has a somewhat shuffling gait. He performed a stretching maneuver as I was talking to him leaning back somewhat. Upon arrival of the meal tray, he walked off to his room to get breakfast.  It appears likely that the unsteady gait and difficulty with self bathing and dressing is likely the additive effects of antipsychotics with the patient's underlying MS. Psychiatry is  to be rounding on the  patient today. It would be appropriate for them to reevaluate the patient's antipsychotics to determine if he could be transitioned to a lower level of sedation. I will consult neurology to discuss the case and see if they have suggestions for medical management. At this time my impression is that the patient is not having an acute MS exacerbation above baseline but that this is the cumulative effects of antipsychotics sedation with underlying MS. Will check a basic chemistry panel just for monitoring electrolytes. Patient otherwise does not appear to be acutely ill.  Arby Barrette, MD 04/28/15 [phone]  I had discussed the case with Dr. Lu Duffel. At this time he does not advise for repeat MRI or high-dose steroids.  Arby Barrette, MD 04/28/15 0900

## 2015-04-28 NOTE — ED Notes (Signed)
After being made aware of the changes in pt, writer asked Dr Lowella Bandy to see pt medically for evaluation. Also, due to weakness and incontinence of urine pt will be moved to TCU shortly with a sitter until stable again. Pt does have history of MS, symptoms may be an exacerbation. Janie RN updated.

## 2015-04-28 NOTE — ED Notes (Signed)
Resting quietly in room.

## 2015-04-28 NOTE — ED Notes (Signed)
Pt ambulatory to room 37 w/o difficulty. On arrival to room pt angry about transfer and flipped his bedside table.  Pt righted table w/o prompting and apologized.

## 2015-04-28 NOTE — ED Notes (Signed)
AC and NP made aware of pts. Condition.

## 2015-04-28 NOTE — ED Notes (Signed)
Sitting in room watching tv talking to self
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
## 2015-04-29 DIAGNOSIS — F259 Schizoaffective disorder, unspecified: Secondary | ICD-10-CM | POA: Diagnosis not present

## 2015-04-29 DIAGNOSIS — F209 Schizophrenia, unspecified: Secondary | ICD-10-CM | POA: Diagnosis not present

## 2015-04-29 MED ORDER — OLANZAPINE 10 MG PO TBDP
10.0000 mg | ORAL_TABLET | Freq: Once | ORAL | Status: AC
  Administered 2015-04-29: 10 mg via ORAL
  Filled 2015-04-29: qty 1

## 2015-04-29 NOTE — ED Notes (Signed)
Pt. Noted sleeping in room. No complaints or concerns voiced. No distress or abnormal behavior noted. Will continue to monitor with security cameras. Q 15 minute rounds continue. 

## 2015-04-29 NOTE — ED Notes (Signed)
Pt eating supper and came out of his room and charged the exit doors.  Pt then walked to the hall yelling about "that bitc...Marland Kitchen"

## 2015-04-29 NOTE — ED Notes (Signed)
Patient is in the day room watching TV
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
## 2015-04-29 NOTE — ED Notes (Addendum)
Patient was given a dose of p.r.n. Vistaril after displaying increasing agitation on the unit.  Patient was cooperative with medication administration.

## 2015-04-29 NOTE — ED Notes (Signed)
Report received from [facility]. Pt. Alert in no acute distress denies SI, HI, AVH and pain.  Pt. Instructed to come to me with problems or concerns.Will continue to monitor for safety via security cameras and Q 15 minute checks.

## 2015-04-29 NOTE — ED Notes (Signed)
Pt. Beating repeatedly on nurses station wall/window. Redirection attempted with moderate success by [employer] and [employer].

## 2015-04-29 NOTE — ED Notes (Signed)
Dr a and jamison DNP into see 

## 2015-04-29 NOTE — ED Notes (Signed)
Pt. Noted in room. No complaints or concerns voiced. No distress or abnormal behavior noted. Will continue to monitor with security cameras. Q 15 minute rounds continue. 

## 2015-04-29 NOTE — ED Notes (Addendum)
Patient became irritable, pacing and punching walls.  Distraction and redirection were attempted by staff.  Patient was cooperative with medication administration.

## 2015-04-29 NOTE — ED Notes (Signed)
On the phone 

## 2015-04-29 NOTE — ED Notes (Signed)
In the bathroom

## 2015-04-29 NOTE — ED Notes (Signed)
Note-patch removal was from 04/28/15-not 04/29/15

## 2015-04-29 NOTE — ED Notes (Addendum)
Pt up to the bathroom and reports he "had a accident."  Urine noted on bathroom floor. Pt back to his room to change scrubs.

## 2015-04-29 NOTE — ED Notes (Signed)
Pt up in the hall, more animated/agitated talking louder.  Pt cursing and hit the glass w/ his fist talking about "the bit....".  Pt redirectable

## 2015-04-30 ENCOUNTER — Emergency Department (HOSPITAL_COMMUNITY): Payer: Medicare Other

## 2015-04-30 DIAGNOSIS — F209 Schizophrenia, unspecified: Secondary | ICD-10-CM | POA: Diagnosis not present

## 2015-04-30 MED ORDER — PROPRANOLOL HCL 10 MG PO TABS
10.0000 mg | ORAL_TABLET | Freq: Two times a day (BID) | ORAL | Status: DC
  Administered 2015-04-30 – 2015-05-03 (×7): 10 mg via ORAL
  Filled 2015-04-30 (×10): qty 1

## 2015-04-30 NOTE — ED Notes (Addendum)
Patient becoming increasingly agitated.  Patient pushed tray table across room to hit the wall and was yelling at mirror.  Patient lightly punched mirror several times.  No injuries noted to patient's hands.  Security was called and patient was given prn dose of Ativan and Haldol.  Patient sitting on side of bed clenching fists.  Security and [employer] at bedside.

## 2015-04-30 NOTE — ED Notes (Signed)
   04/30/15 0224  What Happened  Was fall witnessed? Yes  Who witnessed fall? (unwitnessed)  Patients activity before fall other (comment) (sleeping)  Point of contact (floor)  Was patient injured? (left lower leg)  Follow Up  MD notified (Dr. Wilkie Aye)  Time MD notified [phone])  Time family notified (Pt. declined.)  Additional tests (xray)  Simple treatment (exam/ROM)  Adult Fall Risk Assessment  Risk Factor Category (scoring not indicated) Fall has occurred during this admission (document High fall risk);High fall risk per protocol (document High fall risk)  Age 24 (74)  Fall History: Fall within 6 months prior to admission 0 (UNK)  Elimination; Bowel and/or Urine Incontinence 2  Elimination; Bowel and/or Urine Urgency/Frequency 0  Medications: includes PCA/Opiates, Anti-convulsants, Anti-hypertensives, Diuretics, Hypnotics, Laxatives, Sedatives, and Psychotropics 5  Patient Care Equipment 0  Mobility-Assistance 2  Mobility-Gait 2  Mobility-Sensory Deficit 0  Cognition-Awareness 0  Cognition-Impulsiveness 2  Cognition-Limitations 4  Total Score 17  Patient's Fall Risk High Fall Risk (>13 points)  Adult Fall Risk Interventions  Required Bundle Interventions *See Row Information* High fall risk - low, moderate, and high requirements implemented  Additional Interventions Room near nurses station;24 hour supervision/sitter

## 2015-04-30 NOTE — ED Notes (Signed)
Pt. Noted sleeping in room. No complaints or concerns voiced. No distress or abnormal behavior noted. Will continue to monitor with security cameras. Q 15 minute rounds continue. 

## 2015-04-30 NOTE — ED Notes (Addendum)
Bedside report received from previous RN, Juliette Alcide. Pt is sleeping.

## 2015-04-30 NOTE — Progress Notes (Signed)
CSW spoke with patient's mom/ [phone] who states that she went to court house to obtain copies of her guardian ship papers. Mom states that patient's dad Kregg Fallert. Should bring copies of the official guardianship paperwork today.  Trish Mage [phone] ED CSW 04/30/2015 1:00 PM

## 2015-04-30 NOTE — ED Notes (Signed)
Pt occasionally yells out obscenities and has to be redirected to calm down and remain in room.

## 2015-04-30 NOTE — Progress Notes (Signed)
Pt arrived on this unit after 3am and has been asleep since other than when we have awakened him for VS and post fall check.  No visible area of injury noted on pt leg.  Brandon [name] P

## 2015-04-30 NOTE — ED Notes (Signed)
Pt appears to be resting on stretcher, respirations even and unlabored, skin warm and dry, denies further needs, will continue to monitor.

## 2015-04-30 NOTE — BH Assessment (Signed)
[facility] Assessment Progress Note  At 08:30 Robinette at [facility] reports that pt remains on their wait list.  Doylene Canning, MA Triage Specialist [phone]

## 2015-04-30 NOTE — ED Provider Notes (Signed)
2:35 AM Received a call from nursing. Per nursing, patient fell out of bed Low.  Described as a low-impact fall. Patient was not observed to hit his head. Did not lose consciousness. Is complaining of leg pain. No obvious deformities per nursing.  Report generated. Plain films ordered.   6:16 AM Plain films negative.  Shon Baton, MD 04/30/15 [phone]

## 2015-04-30 NOTE — [facility] Counselor (Signed)
Reassessment note:  Pt was calm and cooperative with Clinical research associate with no complaints or distress noted. He denies SI, HI or A/V hallucinations this morning. He states that everything has been "going well" this weekend and he is feeling better. Eye contact was good and speech was coherent. No issues at this time.   [name], M.S., LPCA, [place], [facility] Licensed Professional Counselor Associate  Triage Specialist  [facility]  Therapeutic Triage Services Phone: [phone] Fax: [phone]

## 2015-05-01 DIAGNOSIS — F209 Schizophrenia, unspecified: Secondary | ICD-10-CM | POA: Diagnosis not present

## 2015-05-01 NOTE — ED Notes (Signed)
Patient rolled in between bed and chair while sleeping.  Patient did not not fall - patient slid down very slowly and blankets were bunched under him on floor.  Patient stood up and was repositioned in bed without difficulty.

## 2015-05-01 NOTE — ED Notes (Signed)
Patient sleeping, respirations even and non labored, skin warm and dry.   

## 2015-05-01 NOTE — BH Assessment (Signed)
[facility] Assessment Progress Note  At 08:48 Nina at [facility] reports that pt remains on their wait list.  Ramin Zoll, MA Triage Specialist [phone]     

## 2015-05-01 NOTE — Progress Notes (Signed)
Pt transfered from main ED to [facility] earlier this shift post recent fall and urinary incontinence. Pt presents with animated affect but labile mood. Observed banging on walls with closed fists in his room and in hallway at intervals during shift. Verbal redirections was ineffective at the time. PRN Haldol 5 mg PO and PRN Ativan 2mg  PO both administered at 1432 for agitation as ordered. Pt calm with intermittent pacing in halls, inappropriatate laughter and without further banging. Pt has been compliant with ordered medications. Vitals WNL.  Pt has been continent of urine thus far. Gait is unsteady at times but he ambulates independently without fall event thus far this shift. Q 15 minutes checks maintained for safety.

## 2015-05-01 NOTE — Progress Notes (Signed)
Grandmother of patient provided documentation on behalf of Renae/mom verifying that she is the guardian of patient.  Trish Mage [phone] ED CSW 05/01/2015 12:20 PM

## 2015-05-01 NOTE — Progress Notes (Signed)
Mom reached out to CSW and states that patient has a trespassing charge against him. Mom states that lawyer asked if patient could have a forensic test done.   CSW explained to mom that the emergency room is considered outpatient and that she would have to arrange that separately.   Trish Mage [phone] ED CSW 05/01/2015 12:22 PM

## 2015-05-01 NOTE — BH Assessment (Signed)
Reassessment 05/01/2015:  Patient continues to remain at [facility] for placement at [facility]. Patient denies SI, HI, and AVH's. Patient seems distracted during the assessment. Patient had to be redirected with answering questions. Patient was however calm and cooperative. Patient reports sleeping and eating well. He asked this Clinical research associate when he would be able to return home. Writer explained to patient that he is currently awaiting placement and would be evaluated daily for level of care. Patient was agreeable to this plan and had no further questions.   Per Dr. Jannifer Franklin and May, NP patient to remain in the ED for placement.

## 2015-05-02 DIAGNOSIS — F209 Schizophrenia, unspecified: Secondary | ICD-10-CM | POA: Diagnosis not present

## 2015-05-02 DIAGNOSIS — F259 Schizoaffective disorder, unspecified: Secondary | ICD-10-CM | POA: Diagnosis not present
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
## 2015-05-02 NOTE — Progress Notes (Signed)
Pt A & O to self and place at this time. Cooperative with care. Pt observed responding to internal stimuli at intervals during shift aeb intermittent laughter, talking to self spontaneous running and shaking bed rails. Verbal redirections ineffective as pt's behavior continued to escalate with intentional lying on floor and masturbating with intermittent agitation when redirections attempted. PRN Haldol 5 mg PO and PRN Ativan 2 mg PO administered as ordered for agitation and was effective. Pt showered, changed scrubs and bed linens with staff assistance post incontinent episode X 1 this AM. Writer attempted to transfer pt back to TCU due to unsteady gait and incontinent episode earlier this shift but to no avail related to staff to pt ratio in TCU at the time. Pt remains medication compliant. Continue to require frequent redirections throughout this shift without falls. Safety maintained on Q 15 minutes checks as ordered.

## 2015-05-02 NOTE — ED Notes (Signed)
Patient in room resting at the beginning of the shift. Mood and affects flat and depressed. He endorsed a goo day. Patient thought process remains disorganized. He is labile, bizarre and has poor judgement. Staff patient patient snacks. Q 15 minute checks continues for safety.

## 2015-05-02 NOTE — Progress Notes (Signed)
CSW was notified by TTS disposition that it is a possibility that patient will stabilize tonight and will be discharged tomorrow. CSW reached out to mom and informed her that it is likely patient will be discharged home tomorrow.  Trish Mage [phone] ED CSW 05/02/2015 4:35 PM

## 2015-05-02 NOTE — ED Notes (Signed)
Patient woke up within the past hour and requested for a pitcher of water. He was unable to sleep after that. He appeared rested and was pacing the hallway aimlessly. Staff asked patient if he needed anything. Patient stated he wants his medication to him to sleep and rest. He reported that he was feeling anxious and restless. PRN Ativan given as ordered for anxiety.

## 2015-05-02 NOTE — BH Assessment (Signed)
[facility] Assessment Progress Note  At 09:20 Sonya at [facility] reports that pt remains on their wait list.  Doylene Canning, MA Triage Specialist [phone]

## 2015-05-03 DIAGNOSIS — F209 Schizophrenia, unspecified: Secondary | ICD-10-CM | POA: Diagnosis not present

## 2015-05-03 DIAGNOSIS — F259 Schizoaffective disorder, unspecified: Secondary | ICD-10-CM | POA: Diagnosis not present

## 2015-05-03 MED ORDER — PALIPERIDONE ER 9 MG PO TB24: 9.0000 mg | ORAL_TABLET | Freq: Every day | ORAL | Status: DC

## 2015-05-03 MED ORDER — HALOPERIDOL 5 MG PO TABS: 5.0000 mg | ORAL_TABLET | Freq: Three times a day (TID) | ORAL | Status: DC | PRN

## 2015-05-03 MED ORDER — QUETIAPINE FUMARATE 50 MG PO TABS: 50.0000 mg | ORAL_TABLET | Freq: Every day | ORAL | Status: DC

## 2015-05-03 MED ORDER — PROPRANOLOL HCL 10 MG PO TABS: 10.0000 mg | ORAL_TABLET | Freq: Two times a day (BID) | ORAL | Status: DC

## 2015-05-03 NOTE — Telephone Encounter (Signed)
Error

## 2015-05-03 NOTE — BH Assessment (Signed)
[facility] Assessment Progress Note  At 09:48 Robinette at [facility] reports that pt remains on their wait list.  Doylene Canning, MA Triage Specialist [phone]

## 2015-05-03 NOTE — ED Notes (Signed)
Patient discharged to home.  Denies thoughts of harm to self or others.  Does exhibit evidence of responding to internal stimuli.  Behavior has been appropriate today.  Left the unit ambulatory.  Escorted to the front lobby where his aunt was waiting to pick him up.
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
## 2015-05-03 NOTE — ED Notes (Signed)
Patient am b/p lying was low. Writer gave patient a cup of Gatorade. Will report to incoming RN.

## 2015-05-03 NOTE — [facility] Suicide Risk Assessment (Addendum)
Suicide Risk Assessment  Discharge Assessment   [facility] Discharge Suicide Risk Assessment   Demographic Factors:  Male and Adolescent or young adult  Total Time spent with patient: 30 minutes  Musculoskeletal: Strength & Muscle Tone: within normal limits Gait & Station: normal Patient leans: N/A  Psychiatric Specialty Exam: Review of Systems  Constitutional: Negative.   HENT: Negative.   Eyes: Negative.   Respiratory: Negative.   Cardiovascular: Negative.   Gastrointestinal: Negative.   Genitourinary: Negative.   Musculoskeletal: Negative.   Skin: Negative.   Neurological: Negative.   Endo/Heme/Allergies: Negative.   Psychiatric/Behavioral:       Negative    Blood pressure 95/52, pulse 76, temperature 98 F (36.7 C), temperature source Oral, resp. rate 16, SpO2 99 %.There is no weight on file to calculate BMI.  General Appearance: Casual  Eye Contact::  Good  Speech:  Normal Rate  Volume:  Normal  Mood:  Euthymic  Affect:  Congruent  Thought Process:  Coherent  Orientation:  Full (Time, Place, and Person)  Thought Content:  WDL  Suicidal Thoughts:  No  Homicidal Thoughts:  No  Memory:  Immediate;   Good Recent;   Fair Remote;   Fair  Judgement:  Fair  Insight:  Fair  Psychomotor Activity:  Normal  Concentration:  Good  Recall:  [employer] of Knowledge:Fair  Language: Fair  Akathisia:  No  Handed:  Right  AIMS (if indicated):     Assets:  Housing Leisure Time Physical Health Resilience Social Support  ADL's:  Intact  Cognition: Impaired,  Mild  Sleep:          Has this patient used any form of tobacco in the last 30 days? (Cigarettes, Smokeless Tobacco, Cigars, and/or Pipes) No  Mental Status Per Nursing Assessment::   On Admission:   Psychosis  Current Mental Status by Physician: NA  Loss Factors: NA  Historical Factors: Impulsivity  Risk Reduction Factors:   Sense of responsibility to family, Living with another person, especially a  relative, Positive social support and Positive therapeutic relationship  Continued Clinical Symptoms:  None   Cognitive Features That Contribute To Risk:  None    Suicide Risk:  Minimal: No identifiable suicidal ideation.  Patients presenting with no risk factors but with morbid ruminations; may be classified as minimal risk based on the severity of the depressive symptoms  Principal Problem: Schizoaffective disorder, unspecified type [facility]) Discharge Diagnoses:  Patient Active Problem List   Diagnosis Date Noted  . Hallucinations [R44.3] 08/05/2014    Priority: High  . Homicidal ideation [R45.850]     Priority: High  . Schizoaffective disorder, unspecified type (HCC) [F25.9]     Priority: High  . Multiple sclerosis (HCC) [G35] 05/20/2013    Priority: High  . Auditory hallucination [R44.0]   . Left-sided weakness [M62.89] 01/18/2014  . White matter abnormality on MRI of brain [R93.0] 11/23/2012  . Abnormality of gait [R26.9] 11/23/2012  . HTN (hypertension) [I10] 07/04/2011  . Ataxia [R27.0] 07/02/2011  . OBESITY [E66.9] 01/14/2010  . ADHD [F90.9] 01/14/2010      Plan Of Care/Follow-up recommendations:  Activity:  as tolerated  Diet:  heart healthy diet  Is patient on multiple antipsychotic therapies at discharge:  Yes   Has Patient had three or more failed trials of antipsychotic monotherapy by history:  Yes; haldol,   Recommended Plan for Multiple Antipsychotic Therapies:  Continue medications as prescribed to maintain mood stabilization, follow-up with his out-patient provider after discharge  Nanine Means, PMH-NP 05/03/2015, 10:55 AM

## 2015-05-03 NOTE — ED Notes (Signed)
Patient resting quietly with eyes closed. Respirations even and unlabored. No distress noted. Q 15 minute checks continues as ordered for safety.

## 2015-05-09 ENCOUNTER — Other Ambulatory Visit: Payer: Self-pay | Admitting: Neurology

## 2015-05-09 DIAGNOSIS — G35 Multiple sclerosis: Secondary | ICD-10-CM

## 2015-05-09 MED ORDER — ACETAMINOPHEN 325 MG PO TABS: 650.0000 mg | ORAL_TABLET | ORAL | Status: AC

## 2015-05-09 MED ORDER — SODIUM CHLORIDE 0.9 % IV SOLN: 500.0000 mg | INTRAVENOUS | Status: AC

## 2015-05-09 MED ORDER — SODIUM CHLORIDE 0.9 % IV SOLN: 300.0000 mg | INTRAVENOUS | Status: DC

## 2015-05-09 MED ORDER — EPINEPHRINE 0.3 MG/0.3ML IJ SOAJ: 0.3000 mg | INTRAMUSCULAR | Status: DC

## 2015-05-09 MED ORDER — LORATADINE 10 MG PO TABS: 10.0000 mg | ORAL_TABLET | ORAL | Status: AC

## 2015-05-11 ENCOUNTER — Encounter (HOSPITAL_COMMUNITY)
Admission: RE | Admit: 2015-05-11 | Discharge: 2015-05-11 | Disposition: A | Discharge status: Home / Self Care | Payer: Medicare Other | Source: Ambulatory Visit | Attending: Neurology | Admitting: Neurology

## 2015-05-11 ENCOUNTER — Telehealth: Payer: Self-pay | Admitting: Neurology

## 2015-05-11 VITALS — BP 120/78 | HR 59 | Temp 97.7°F | Resp 16

## 2015-05-11 DIAGNOSIS — G35 Multiple sclerosis: Secondary | ICD-10-CM

## 2015-05-11 MED ORDER — SODIUM CHLORIDE 0.9 % IV SOLN
INTRAVENOUS | Status: DC
  Administered 2015-05-11: 13:00:00 via INTRAVENOUS

## 2015-05-11 MED ORDER — LORATADINE 10 MG PO TABS
10.0000 mg | ORAL_TABLET | ORAL | Status: DC
  Administered 2015-05-11: 10 mg via ORAL
  Filled 2015-05-11: qty 1

## 2015-05-11 MED ORDER — ACETAMINOPHEN 325 MG PO TABS
650.0000 mg | ORAL_TABLET | ORAL | Status: DC
  Administered 2015-05-11: 650 mg via ORAL
  Filled 2015-05-11: qty 2

## 2015-05-11 MED ORDER — SODIUM CHLORIDE 0.9 % IV SOLN
500.0000 mg | INTRAVENOUS | Status: DC
  Filled 2015-05-11: qty 4

## 2015-05-11 MED ORDER — SODIUM CHLORIDE 0.9 % IV SOLN
300.0000 mg | INTRAVENOUS | Status: DC
  Administered 2015-05-11: 300 mg via INTRAVENOUS
  Filled 2015-05-11: qty 15

## 2015-05-11 NOTE — Progress Notes (Signed)
Pt observed 1 hour post transfusion, no adverse reaction. Transportation called at 1445.

## 2015-05-11 NOTE — Telephone Encounter (Signed)
The patient comes in today for Tysabri treatments, the patient does report some mild changes in balance, there has been no significant alterations in function, the patient just had MRI evaluation 6 weeks ago. Okay to give Tysabri.

## 2015-05-11 NOTE — Discharge Instructions (Signed)

## 2015-05-11 NOTE — Progress Notes (Signed)
Pt in 1318 with his mother awaiting infusion and starts using profanity and yelling. Reenetered room and assisted Pt back to chair and applied coban to IV in left arm. He had pulled at IV tubing and removed part of tape. Emotional support provided.

## 2015-05-15 ENCOUNTER — Encounter (HOSPITAL_COMMUNITY): Payer: Self-pay | Admitting: Emergency Medicine

## 2015-05-15 ENCOUNTER — Emergency Department (HOSPITAL_COMMUNITY)
Admission: EM | Admit: 2015-05-15 | Discharge: 2015-05-16 | Disposition: A | Discharge status: Home / Self Care | Payer: Medicare Other | Attending: Emergency Medicine | Admitting: Emergency Medicine

## 2015-05-15 DIAGNOSIS — F319 Bipolar disorder, unspecified: Secondary | ICD-10-CM | POA: Insufficient documentation

## 2015-05-15 DIAGNOSIS — T1491 Suicide attempt: Secondary | ICD-10-CM | POA: Diagnosis present

## 2015-05-15 DIAGNOSIS — F209 Schizophrenia, unspecified: Secondary | ICD-10-CM | POA: Insufficient documentation

## 2015-05-15 DIAGNOSIS — Z720 Tobacco use: Secondary | ICD-10-CM | POA: Diagnosis not present

## 2015-05-15 DIAGNOSIS — F250 Schizoaffective disorder, bipolar type: Secondary | ICD-10-CM | POA: Insufficient documentation

## 2015-05-15 DIAGNOSIS — Z9119 Patient's noncompliance with other medical treatment and regimen: Secondary | ICD-10-CM | POA: Insufficient documentation

## 2015-05-15 DIAGNOSIS — Z79899 Other long term (current) drug therapy: Secondary | ICD-10-CM | POA: Diagnosis not present

## 2015-05-15 DIAGNOSIS — G8929 Other chronic pain: Secondary | ICD-10-CM | POA: Diagnosis not present

## 2015-05-15 DIAGNOSIS — I10 Essential (primary) hypertension: Secondary | ICD-10-CM | POA: Insufficient documentation

## 2015-05-15 DIAGNOSIS — F911 Conduct disorder, childhood-onset type: Secondary | ICD-10-CM | POA: Diagnosis not present

## 2015-05-15 DIAGNOSIS — F259 Schizoaffective disorder, unspecified: Secondary | ICD-10-CM

## 2015-05-15 DIAGNOSIS — E669 Obesity, unspecified: Secondary | ICD-10-CM | POA: Diagnosis not present

## 2015-05-15 DIAGNOSIS — G35 Multiple sclerosis: Secondary | ICD-10-CM

## 2015-05-15 DIAGNOSIS — R4689 Other symptoms and signs involving appearance and behavior: Secondary | ICD-10-CM

## 2015-05-15 LAB — CBC WITH DIFFERENTIAL/PLATELET
Basophils Absolute: 0 10*3/uL (ref 0.0–0.1)
Basophils Relative: 0 %
Eosinophils Absolute: 0.2 10*3/uL (ref 0.0–0.7)
Eosinophils Relative: 2 %
HCT: 42.5 % (ref 39.0–52.0)
Hemoglobin: 13.3 g/dL (ref 13.0–17.0)
Lymphocytes Relative: 44 %
Lymphs Abs: 3 10*3/uL (ref 0.7–4.0)
MCH: 25.1 pg — ABNORMAL LOW (ref 26.0–34.0)
MCHC: 31.3 g/dL (ref 30.0–36.0)
MCV: 80.2 fL (ref 78.0–100.0)
Monocytes Absolute: 0.5 10*3/uL (ref 0.1–1.0)
Monocytes Relative: 7 %
Neutro Abs: 3.1 10*3/uL (ref 1.7–7.7)
Neutrophils Relative %: 47 %
Platelets: 162 10*3/uL (ref 150–400)
RBC: 5.3 MIL/uL (ref 4.22–5.81)
RDW: 14.7 % (ref 11.5–15.5)
WBC: 6.8 10*3/uL (ref 4.0–10.5)

## 2015-05-15 LAB — COMPREHENSIVE METABOLIC PANEL
ALT: 42 U/L (ref 17–63)
AST: 21 U/L (ref 15–41)
Albumin: 4.3 g/dL (ref 3.5–5.0)
Alkaline Phosphatase: 102 U/L (ref 38–126)
Anion gap: 7 (ref 5–15)
BUN: 17 mg/dL (ref 6–20)
CO2: 26 mmol/L (ref 22–32)
Calcium: 9.6 mg/dL (ref 8.9–10.3)
Chloride: 107 mmol/L (ref 101–111)
Creatinine, Ser: 0.81 mg/dL (ref 0.61–1.24)
GFR calc Af Amer: >60 mL/min (ref >60)
GFR calc non Af Amer: >60 mL/min (ref >60)
Glucose, Bld: 101 mg/dL — ABNORMAL HIGH (ref 65–99)
Potassium: 3.6 mmol/L (ref 3.5–5.1)
Sodium: 140 mmol/L (ref 135–145)
Total Bilirubin: 0.7 mg/dL (ref 0.3–1.2)
Total Protein: 6.9 g/dL (ref 6.5–8.1)

## 2015-05-15 LAB — ETHANOL: Alcohol, Ethyl (B): <5 mg/dL (ref <5)

## 2015-05-15 MED ORDER — LORAZEPAM 1 MG PO TABS
1.0000 mg | ORAL_TABLET | Freq: Three times a day (TID) | ORAL | Status: DC | PRN
  Administered 2015-05-16: 1 mg via ORAL
  Filled 2015-05-15: qty 1

## 2015-05-15 MED ORDER — BENZTROPINE MESYLATE 1 MG PO TABS
1.0000 mg | ORAL_TABLET | Freq: Two times a day (BID) | ORAL | Status: DC
  Administered 2015-05-15 – 2015-05-16 (×3): 1 mg via ORAL
  Filled 2015-05-15 (×3): qty 1

## 2015-05-15 MED ORDER — ONDANSETRON HCL 4 MG PO TABS: 4.0000 mg | ORAL_TABLET | Freq: Three times a day (TID) | ORAL | Status: DC | PRN

## 2015-05-15 MED ORDER — ZIPRASIDONE MESYLATE 20 MG IM SOLR: 20.0000 mg | INTRAMUSCULAR | Status: DC | PRN

## 2015-05-15 MED ORDER — ZOLPIDEM TARTRATE 5 MG PO TABS: 5.0000 mg | ORAL_TABLET | Freq: Every evening | ORAL | Status: DC | PRN

## 2015-05-15 MED ORDER — IBUPROFEN 200 MG PO TABS: 600.0000 mg | ORAL_TABLET | Freq: Three times a day (TID) | ORAL | Status: DC | PRN

## 2015-05-15 MED ORDER — TRAZODONE HCL 50 MG PO TABS: 50.0000 mg | ORAL_TABLET | Freq: Every evening | ORAL | Status: DC | PRN

## 2015-05-15 MED ORDER — LORATADINE 10 MG PO TABS
10.0000 mg | ORAL_TABLET | ORAL | Status: DC
  Administered 2015-05-15: 10 mg via ORAL
  Filled 2015-05-15: qty 1

## 2015-05-15 MED ORDER — OXCARBAZEPINE 300 MG PO TABS
600.0000 mg | ORAL_TABLET | Freq: Two times a day (BID) | ORAL | Status: DC
  Administered 2015-05-15 – 2015-05-16 (×3): 600 mg via ORAL
  Filled 2015-05-15 (×5): qty 2

## 2015-05-15 MED ORDER — PALIPERIDONE PALMITATE 234 MG/1.5ML IM SUSP
234.0000 mg | Freq: Once | INTRAMUSCULAR | Status: DC
Start: 2015-05-15 — End: 2015-05-15

## 2015-05-15 MED ORDER — AMLODIPINE BESYLATE 10 MG PO TABS
10.0000 mg | ORAL_TABLET | Freq: Every day | ORAL | Status: DC
  Administered 2015-05-15 – 2015-05-16 (×2): 10 mg via ORAL
  Filled 2015-05-15 (×2): qty 1

## 2015-05-15 MED ORDER — ACETAMINOPHEN 325 MG PO TABS: 650.0000 mg | ORAL_TABLET | ORAL | Status: DC | PRN

## 2015-05-15 MED ORDER — PALIPERIDONE ER 6 MG PO TB24: 9.0000 mg | ORAL_TABLET | Freq: Every day | ORAL | Status: DC

## 2015-05-15 MED ORDER — PROPRANOLOL HCL 10 MG PO TABS
10.0000 mg | ORAL_TABLET | Freq: Two times a day (BID) | ORAL | Status: DC
  Administered 2015-05-15 – 2015-05-16 (×3): 10 mg via ORAL
  Filled 2015-05-15 (×5): qty 1

## 2015-05-15 NOTE — ED Notes (Signed)
Patient refusing to feed himself. Asking sitter to fed him. Patient then fell back into bed and stated that he's too weak to sit up. RN and staff go patient to stand and sit back in bed without staff assistance. Patient now not responding to questions, staring off in space.
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
## 2015-05-15 NOTE — ED Notes (Signed)
Pt has in belonging bag:  Red plaid pj, red plaid t-shirt, red shoes,

## 2015-05-15 NOTE — ED Notes (Signed)
Per [employer], patient for SI.  Patient is from home.
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
## 2015-05-15 NOTE — BH Assessment (Signed)
[facility] Assessment Progress Note  Pt referred to [facility] [address], [place], and [place].  Decision pending as of this writing.  Doylene Canning, MA Triage Specialist [phone]

## 2015-05-15 NOTE — ED Notes (Signed)
Notified by Guadalupe Dawn, that the pt's mother called and was concerned about the pt not having a bowel movement. Pt's mother requesting for the pt to have a suppository.
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
## 2015-05-15 NOTE — ED Notes (Signed)
Patient ambulatory with 1 assist

## 2015-05-15 NOTE — ED Notes (Signed)
Sitter at bedside.

## 2015-05-16 DIAGNOSIS — F259 Schizoaffective disorder, unspecified: Secondary | ICD-10-CM | POA: Diagnosis not present

## 2015-05-16 DIAGNOSIS — F911 Conduct disorder, childhood-onset type: Secondary | ICD-10-CM | POA: Diagnosis not present

## 2015-05-16 NOTE — Progress Notes (Signed)
Mom reached out to CSW and states that patient's dad will be to [place] to pick up the patient shortly.   CSW made NP aware. CSW made nurse aware.  Trish Mage [phone] ED CSW 05/16/2015 6:09 PM

## 2015-05-16 NOTE — BHH Suicide Risk Assessment (Cosign Needed)
Suicide Risk Assessment  Discharge Assessment   [facility] Discharge Suicide Risk Assessment   Demographic Factors:  Male and Unemployed  Total Time spent with patient: 20 minutes  Musculoskeletal: Strength & Muscle Tone: within normal limits Gait & Station: normal Patient leans: N/A  Psychiatric Specialty Exam:     Blood pressure 139/89, pulse 99, temperature 98.7 F (37.1 C), temperature source Oral, resp. rate 20, SpO2 99 %.There is no weight on file to calculate BMI.  General Appearance: Fairly Groomed  Patent attorney:: good  Speech: Slow  Volume: normal  Mood: Depressed  Affect: Congruent  Thought Process: Linea  Orientation: Full (Time, Place, and Person)  Thought Content: WDL  Suicidal Thoughts: No  Homicidal Thoughts: No  Memory: Immediate; Fair Recent; Fair Remote; Fair  Judgement: fair  Insight: Lacking and Shallow  Psychomotor Activity: normal  Concentration: fair  Recall: Poor  Fund of Knowledge:Poor  Language: Fair  Akathisia: No  Handed: Right  AIMS (if indicated):    Assets: Desire for Improvement Housing Resilience  ADL's: Intact  Cognition: WNL        Has this patient used any form of tobacco in the last 30 days? (Cigarettes, Smokeless Tobacco, Cigars, and/or Pipes) No  Mental Status Per Nursing Assessment::   On Admission:     Current Mental Status by Physician: NA  Loss Factors: NA  Historical Factors: Impulsivity  Risk Reduction Factors:   Living with another person, especially a relative and Positive social support  Continued Clinical Symptoms:  Previous Psychiatric Diagnoses and Treatments  Cognitive Features That Contribute To Risk:  Polarized thinking    Suicide Risk:  Minimal: No identifiable suicidal ideation.  Patients presenting with no risk factors but with morbid ruminations; may be classified as minimal risk based on the severity of the depressive symptoms  Principal  Problem: Schizoaffective disorder, unspecified type [facility]) Discharge Diagnoses:  Patient Active Problem List   Diagnosis Date Noted  . Schizoaffective disorder, unspecified type (HCC) [F25.9]     Priority: High  . Schizoaffective disorder (HCC) [F25.9]   . Hallucinations [R44.3] 08/05/2014  . Auditory hallucination [R44.0]   . Homicidal ideation [R45.850]   . Left-sided weakness [M62.89] 01/18/2014  . Multiple sclerosis (HCC) [G35] 05/20/2013  . White matter abnormality on MRI of brain [R93.0] 11/23/2012  . Abnormality of gait [R26.9] 11/23/2012  . HTN (hypertension) [I10] 07/04/2011  . Ataxia [R27.0] 07/02/2011  . OBESITY [E66.9] 01/14/2010  . ADHD [F90.9] 01/14/2010      Plan Of Care/Follow-up recommendations:  Activity:  AS TOLERATED  Is patient on multiple antipsychotic therapies at discharge:  Yes,   Do you recommend tapering to monotherapy for antipsychotics?  No   Has Patient had three or more failed trials of antipsychotic monotherapy by history:  No  Recommended Plan for Multiple Antipsychotic Therapies: NA    Dahlia Byes C    PMHNP-BC 05/16/2015, 6:48 PM

## 2015-05-16 NOTE — ED Notes (Signed)
Patient sleeping, respirations even and non labored, skin warm and dry.

## 2015-05-16 NOTE — Consult Note (Signed)
  Psychiatric Specialty Exam: Physical Exam  ROS  Blood pressure 139/89, pulse 99, temperature 98.7 F (37.1 C), temperature source Oral, resp. rate 20, SpO2 99 %.There is no weight on file to calculate BMI.   General Appearance: Fairly Groomed  Patent attorney:: good  Speech: Slow  Volume: normal  Mood: Depressed  Affect: Congruent  Thought Process: Linea  Orientation: Full (Time, Place, and Person)  Thought Content: WDL  Suicidal Thoughts: No  Homicidal Thoughts: No  Memory: Immediate; Fair Recent; Fair Remote; Fair  Judgement: fair  Insight: Lacking and Shallow  Psychomotor Activity: normal  Concentration: fair  Recall: Poor  Fund of Knowledge:Poor  Language: Fair  Akathisia: No  Handed: Right  AIMS (if indicated):    Assets: Desire for Improvement Housing Resilience  ADL's: Intact  Cognition: WNL       Patient was calm and stayed in his room most of the time.  He is eating and drinking without problems. He has been calm and cooperative.  No agitation or irritability noted.  Patient however is slow in answering questions.  Patient denies SI/HI/AVH.  Patient has been compliant with medications while here.  Patient is cleared from Psychiatry at this time.  He is discharged home to follow up with his outpatient provider.  Patient is discharged.  Schizoaffective disorder, unspecified type [facility])   Plan: Discharged home,   Dahlia Byes   PMHNP-BC Patient seen face-to-face for psychiatric evaluation, chart reviewed and case discussed with the physician extender and developed treatment plan. Reviewed the information documented and agree with the treatment plan. Thedore Mins, MD

## 2015-05-16 NOTE — ED Notes (Signed)
ACTT team member in and requests verbal update on disposition.  [facility] number given and instructed on daily contact to CSW/TTS.Marland Kitchen

## 2015-05-16 NOTE — BH Assessment (Addendum)
Patient was reassessed by TTS.   Patient denies SI/HI an d AVH but appears to be responding to internal stimuli. Patient states that his ACT Team representative just left and "they want to put me in a group home." Patient states that he "want to be with family." Patient asked "what Iif I do go home?" I referred him back to his ACTT and asked if his mother would be okay if he went back home and he shrugged his shoulders. Patient states that he prefers to be at home with family.   Patient was sleeping when this writer arrived and woke up when his name was called. He sat upright in the chair and answered questions appropriately. Patient denies any questions or concerns at this time. Patient does not meet inpatient criteria per psychiatry. CSW will contact patients mother to see if patient can return home and if so, patient can be discharged tomorrow 05/16/2015.  Davina Poke, LCSW Therapeutic Triage Specialist [facility] 05/16/2015 10:18 AM

## 2015-05-16 NOTE — Progress Notes (Signed)
CSW provided mobile crisis, group home, and Outpatient psych resources for patient. CSW pul all information in the chart to leave with the patient upon discharge. CSW made nurse aware.  Trish Mage [phone] ED CSW 05/16/2015 6:26 PM

## 2015-05-16 NOTE — ED Notes (Addendum)
Pt requested to use the phone. He came to the nurses station and he was dribbling a little. MD made aware. Pt is calm for now. Pt has been wondering in the hallway . Pt was given supper at 6:15pm. He broke his glasses and asked the nurse to try to fix them. Glasses were fixed and pt appeared appreciative. Pt informed he will be going back home. He does contract for safety. At times he does appear to be responding to internal stimuli. He smiles inappropriatley .6:40p-Pts father came to pick the pt up. He was given all his belongings back

## 2015-05-16 NOTE — Progress Notes (Signed)
CSW reached out to patient's mom to inform her that the patient is ready for discharge. However, she did not answer the phone.   CSW reached out to patient's grandmother who states patient's mo may be at an appointment. She stats she will try to  Get in touch with mom and tell her to give CSW a call back.  Trish Mage [phone] ED CSW 05/16/2015 5:29 PM

## 2015-05-16 NOTE — Progress Notes (Signed)
CSW spoke with mom. She states that she is interested in group home placement for patient and that she would like patient to stay in [facility]. CSW informed mom that [facility] is not an appropriate setting for the patient to stay and thrive. CSW informed mom that she will provide resource information about group homes and outpatient therapy.  Mom informed CSW to call patient's grandmother to ask her to come pick up patient. CSW called grandmother. However, she stated she just took a shower and does not want to come back out to the house. She states that she will call patient's mom again to arrange another way.  Trish Mage [phone] ED CSW 05/16/2015 6:07 PM

## 2015-05-24 ENCOUNTER — Telehealth: Payer: Self-pay | Admitting: Neurology

## 2015-05-24 NOTE — Telephone Encounter (Signed)
I called the patient's mother. Appointment scheduled 11/21.

## 2015-05-24 NOTE — Telephone Encounter (Signed)
I called the patient's mother. The patient had been somewhat off balance 05/11/15 when he went for his Tysabri treatment, but his mother states his balance is much worse now. She states that yesterday she noticed him leaning backwards and this morning he fell backwards.

## 2015-05-24 NOTE — Telephone Encounter (Signed)
I called the mother. The patient began having some changes in walking yesterday, tendency to lean backwards. The patient has a chronic gait issue, he also has been placed on Haldol within the last month, and he is on Trileptal. I will need to get him into the office and see what is going on with his walking. He last got Tysabri on November 7. He has had a recent MRI the brain done in September 2016.

## 2015-05-24 NOTE — Telephone Encounter (Signed)
[name] is calling. She states the patient is tilting back more, walking backwards and falling backwards. Please call to discuss. Thank you.

## 2015-05-28 ENCOUNTER — Ambulatory Visit (INDEPENDENT_AMBULATORY_CARE_PROVIDER_SITE_OTHER): Payer: Medicare Other | Admitting: Neurology

## 2015-05-28 ENCOUNTER — Encounter: Payer: Self-pay | Admitting: Neurology

## 2015-05-28 VITALS — BP 131/81 | HR 61 | Ht 73.0 in | Wt 299.6 lb

## 2015-05-28 DIAGNOSIS — G35 Multiple sclerosis: Secondary | ICD-10-CM

## 2015-05-28 DIAGNOSIS — Z5181 Encounter for therapeutic drug level monitoring: Secondary | ICD-10-CM

## 2015-05-28 DIAGNOSIS — R269 Unspecified abnormalities of gait and mobility: Secondary | ICD-10-CM

## 2015-05-28 DIAGNOSIS — F259 Schizoaffective disorder, unspecified: Secondary | ICD-10-CM

## 2015-05-28 NOTE — Patient Instructions (Signed)
Fall Prevention in the Home  Falls can cause injuries and can affect people from all age groups. There are many simple things that you can do to make your home safe and to help prevent falls. WHAT CAN I DO ON THE OUTSIDE OF MY HOME?  Regularly repair the edges of walkways and driveways and fix any cracks.  Remove high doorway thresholds.  Trim any shrubbery on the main path into your home.  Use bright outdoor lighting.  Clear walkways of debris and clutter, including tools and rocks.  Regularly check that handrails are securely fastened and in good repair. Both sides of any steps should have handrails.  Install guardrails along the edges of any raised decks or porches.  Have leaves, snow, and ice cleared regularly.  Use sand or salt on walkways during winter months.  In the garage, clean up any spills right away, including grease or oil spills. WHAT CAN I DO IN THE BATHROOM?  Use night lights.  Install grab bars by the toilet and in the tub and shower. Do not use towel bars as grab bars.  Use non-skid mats or decals on the floor of the tub or shower.  If you need to sit down while you are in the shower, use a plastic, non-slip stool..  Keep the floor dry. Immediately clean up any water that spills on the floor.  Remove soap buildup in the tub or shower on a regular basis.  Attach bath mats securely with double-sided non-slip rug tape.  Remove throw rugs and other tripping hazards from the floor. WHAT CAN I DO IN THE BEDROOM?  Use night lights.  Make sure that a bedside light is easy to reach.  Do not use oversized bedding that drapes onto the floor.  Have a firm chair that has side arms to use for getting dressed.  Remove throw rugs and other tripping hazards from the floor. WHAT CAN I DO IN THE KITCHEN?   Clean up any spills right away.  Avoid walking on wet floors.  Place frequently used items in easy-to-reach places.  If you need to reach for something  above you, use a sturdy step stool that has a grab bar.  Keep electrical cables out of the way.  Do not use floor polish or wax that makes floors slippery. If you have to use wax, make sure that it is non-skid floor wax.  Remove throw rugs and other tripping hazards from the floor. WHAT CAN I DO IN THE STAIRWAYS?  Do not leave any items on the stairs.  Make sure that there are handrails on both sides of the stairs. Fix handrails that are broken or loose. Make sure that handrails are as long as the stairways.  Check any carpeting to make sure that it is firmly attached to the stairs. Fix any carpet that is loose or worn.  Avoid having throw rugs at the top or bottom of stairways, or secure the rugs with carpet tape to prevent them from moving.  Make sure that you have a light switch at the top of the stairs and the bottom of the stairs. If you do not have them, have them installed. WHAT ARE SOME OTHER FALL PREVENTION TIPS?  Wear closed-toe shoes that fit well and support your feet. Wear shoes that have rubber soles or low heels.  When you use a stepladder, make sure that it is completely opened and that the sides are firmly locked. Have someone hold the ladder while you   are using it. Do not climb a closed stepladder.  Add color or contrast paint or tape to grab bars and handrails in your home. Place contrasting color strips on the first and last steps.  Use mobility aids as needed, such as canes, walkers, scooters, and crutches.  Turn on lights if it is dark. Replace any light bulbs that burn out.  Set up furniture so that there are clear paths. Keep the furniture in the same spot.  Fix any uneven floor surfaces.  Choose a carpet design that does not hide the edge of steps of a stairway.  Be aware of any and all pets.  Review your medicines with your healthcare provider. Some medicines can cause dizziness or changes in blood pressure, which increase your risk of falling. Talk  with your health care provider about other ways that you can decrease your risk of falls. This may include working with a physical therapist or trainer to improve your strength, balance, and endurance.   This information is not intended to replace advice given to you by your health care provider. Make sure you discuss any questions you have with your health care provider.   Document Released: 06/13/2002 Document Revised: 11/07/2014 Document Reviewed: 07/28/2014 Elsevier Interactive Patient Education 2016 [employer]  

## 2015-05-28 NOTE — Progress Notes (Addendum)
Reason for visit: Multiple sclerosis  Brandon [name] is an 24 y.o. male  History of present illness:  Brandon [name] is a 24 year old right-handed black male with a history of multiple sclerosis and schizoaffective disorder. The patient is on Haldol, and recently was placed on Trileptal. The patient apparently had some change in his ability to walk last week. The patient himself comes to the office by himself, he denies any significant change in walking, but he is a poor historian. He denies any new numbness or weakness of extremities, he does report some numbness in the legs at times. He denies any recent falls. He is not having any new changes with vision, bowel bladder control problems, or confusion. The patient is on Tysabri, he is tolerating the medication well.  Past Medical History  Diagnosis Date  . ADHD (attention deficit hyperactivity disorder)   . Hypertension   . Schizophrenia (HCC)   . Bipolar 1 disorder (HCC)   . 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 Obesity   . Multiple sclerosis (HCC) 05/20/2013    left sided weakness, dysarthria  . Chronic back pain   . Non-compliance   . Chronic neck pain     Past Surgical History  Procedure Laterality Date  . None      Family History  Problem Relation Age of Onset  . Diabetes Mother   . ADD / ADHD Brother     Social history:  reports that he has been smoking Cigarettes.  He has been smoking about 0.25 packs per day. He has never used smokeless tobacco. He reports that he does not drink alcohol or use illicit drugs.   No Known Allergies  Medications:  Prior to Admission medications   Medication Sig Start Date End Date Taking? Authorizing Provider  amLODipine (NORVASC) 10 MG tablet Take 10 mg by mouth daily.   Yes Historical Provider, MD  benztropine (COGENTIN) 1 MG tablet Take 1 tablet (1 mg total) by mouth 2 (two) times daily. 04/02/15  Yes Charm Rings, NP  docusate sodium (COLACE) 100 MG capsule  Take 100 mg by mouth at bedtime.   Yes Historical Provider, MD  haloperidol (HALDOL) 5 MG tablet Take 1 tablet (5 mg total) by mouth every 8 (eight) hours as needed for agitation (Please give with PRN Ativan 2 mg PO  for agitation). 05/03/15  Yes Charm Rings, NP  hydrOXYzine (ATARAX/VISTARIL) 25 MG tablet Take 1 tablet (25 mg total) by mouth 3 (three) times daily as needed for anxiety. 04/02/15  Yes Charm Rings, NP  INVEGA SUSTENNA 156 MG/ML SUSP injection Inject 156 mg as directed every 30 (thirty) days. 04/25/15  Yes Historical Provider, MD  Melatonin 5 MG TABS Take 10 mg by mouth at bedtime as needed (sleep).    Yes Historical Provider, MD  Oxcarbazepine (TRILEPTAL) 600 MG tablet Take 1 tablet (600 mg total) by mouth 2 (two) times daily. 04/02/15  Yes Charm Rings, NP  propranolol (INDERAL) 10 MG tablet Take 1 tablet (10 mg total) by mouth 2 (two) times daily. 05/03/15  Yes Charm Rings, NP  Vitamin D, Ergocalciferol, (DRISDOL) 50000 UNITS CAPS capsule Take 50,000 Units by mouth every 7 (seven) days.   Yes Historical Provider, MD  traZODone (DESYREL) 100 MG tablet Take 200 mg by mouth at bedtime as needed. 05/14/15   Historical Provider, MD    ROS:  Out of a complete 14 system review of symptoms, the patient complains  only of the following symptoms, and all other reviewed systems are negative.  Drooling Constipation Insomnia, frequent waking, snoring, sleep talking Incontinence of the bladder, frequency of urination, testicular pain Back pain, walking difficulty Speech difficulty Behavior problem, confusion, depression, anxiety, hallucinations, self injury  Blood pressure 131/81, pulse 61, height $RemoveBe 54 m), weight 299 lb 9.6 oz (135.898 kg).  Physical Exam  General: The patient is alert and cooperative at the time of the examination. The patient is markedly obese. He has a flat affect.  Skin: No significant peripheral edema is noted.   Neurologic Exam  Mental status:  The patient is alert and oriented x 3 at the time of the examination. The patient has apparent normal recent and remote memory, with an apparently normal attention span and concentration ability.   Cranial nerves: Facial symmetry is present. Speech is normal, no aphasia or dysarthria is noted. Extraocular movements are full. Visual fields are full. Some masking of the face is seen.  Motor: The patient has good strength in all 4 extremities.  Sensory examination: Soft touch sensation is symmetric on the face, arms, and legs.  Coordination: The patient has good finger-nose-finger and heel-to-shin bilaterally.  Gait and station: The patient has a circumduction gait with the left leg. Tandem gait is slightly unsteady. Romberg is negative. No drift is seen.  Reflexes: Deep tendon reflexes are symmetric.   Assessment/Plan:  1. Multiple sclerosis  2. Schizoaffective disorder  3. Gait disturbance  The patient recently has been placed on Haldol and Trileptal. The patient may be developing some degree of secondary parkinsonism. Trileptal may also result in some gait instability. We will check blood work today. The patient appears to be walking at or near his baseline. He does have a tendency to lean backwards when he is sitting, this may be related to the use of Haldol. He will follow-up in 4 months. The patient will continue Tysabri for now.  Marlan Palau MD 05/28/2015 7:51 PM  [facility] [address] [place], [place] [address]  Phone [phone] Fax [phone]

## 2015-05-29 ENCOUNTER — Telehealth: Payer: Self-pay

## 2015-05-29 LAB — COMPREHENSIVE METABOLIC PANEL
ALT: 32 IU/L (ref 0–44)
AST: 16 IU/L (ref 0–40)
Albumin/Globulin Ratio: 2.2 (ref 1.1–2.5)
Albumin: 4.6 g/dL (ref 3.5–5.5)
Alkaline Phosphatase: 113 IU/L (ref 39–117)
BUN/Creatinine Ratio: 14 (ref 8–19)
BUN: 14 mg/dL (ref 6–20)
Bilirubin Total: 0.5 mg/dL (ref 0.0–1.2)
CO2: 26 mmol/L (ref 18–29)
Calcium: 9.3 mg/dL (ref 8.7–10.2)
Chloride: 101 mmol/L (ref 97–106)
Creatinine, Ser: 0.97 mg/dL (ref 0.76–1.27)
GFR calc Af Amer: 127 mL/min/{1.73_m2} (ref >59)
GFR calc non Af Amer: 110 mL/min/{1.73_m2} (ref >59)
Globulin, Total: 2.1 g/dL (ref 1.5–4.5)
Glucose: 77 mg/dL (ref 65–99)
Potassium: 4.6 mmol/L (ref 3.5–5.2)
Sodium: 142 mmol/L (ref 136–144)
Total Protein: 6.7 g/dL (ref 6.0–8.5)

## 2015-05-29 LAB — 10-HYDROXYCARBAZEPINE: Oxcarbazepine SerPl-Mcnc: 16 ug/mL (ref 10–35)

## 2015-05-29 NOTE — Telephone Encounter (Signed)
-----   Message from York Spaniel, MD sent at 05/29/2015  1:43 PM EST -----  The blood work results are unremarkable. Please call the patient.  ----- Message -----    From: [facility] Lab Results In Interface    Sent: 05/29/2015   7:44 AM      To: York Spaniel, MD

## 2015-05-29 NOTE — Telephone Encounter (Signed)
Called patient.  No answer.

## 2015-05-30 ENCOUNTER — Telehealth: Payer: Self-pay

## 2015-05-30 NOTE — Telephone Encounter (Signed)
I spoke to patient's mother and relayed results.

## 2015-05-30 NOTE — Telephone Encounter (Signed)
-----   Message from Charles K Willis, MD sent at 05/29/2015  1:43 PM EST -----  The blood work results are unremarkable. Please call the patient.  ----- Message -----    From: [facility] Lab Results In Interface    Sent: 05/29/2015   7:44 AM      To: Charles K Willis, MD    

## 2015-05-30 NOTE — Telephone Encounter (Signed)
Please see phone note from 05/30/15.

## 2015-06-12 ENCOUNTER — Encounter (HOSPITAL_COMMUNITY): Payer: Medicare Other

## 2015-06-13 ENCOUNTER — Encounter (HOSPITAL_COMMUNITY): Payer: Medicare Other

## 2015-06-13 ENCOUNTER — Ambulatory Visit: Payer: Medicare Other | Admitting: Neurology

## 2015-06-14 ENCOUNTER — Encounter (HOSPITAL_COMMUNITY): Payer: Self-pay | Admitting: Emergency Medicine

## 2015-06-14 ENCOUNTER — Emergency Department (HOSPITAL_COMMUNITY)
Admission: EM | Admit: 2015-06-14 | Discharge: 2015-06-14 | Disposition: A | Discharge status: Home / Self Care | Payer: Medicare Other | Attending: Physician Assistant | Admitting: Physician Assistant

## 2015-06-14 DIAGNOSIS — F17210 Nicotine dependence, cigarettes, uncomplicated: Secondary | ICD-10-CM | POA: Diagnosis not present

## 2015-06-14 DIAGNOSIS — E669 Obesity, unspecified: Secondary | ICD-10-CM | POA: Insufficient documentation

## 2015-06-14 DIAGNOSIS — Z9119 Patient's noncompliance with other medical treatment and regimen: Secondary | ICD-10-CM | POA: Insufficient documentation

## 2015-06-14 DIAGNOSIS — Z79899 Other long term (current) drug therapy: Secondary | ICD-10-CM | POA: Diagnosis not present

## 2015-06-14 DIAGNOSIS — Y92410 Unspecified street and highway as the place of occurrence of the external cause: Secondary | ICD-10-CM | POA: Insufficient documentation

## 2015-06-14 DIAGNOSIS — Y9389 Activity, other specified: Secondary | ICD-10-CM | POA: Insufficient documentation

## 2015-06-14 DIAGNOSIS — F319 Bipolar disorder, unspecified: Secondary | ICD-10-CM | POA: Diagnosis not present

## 2015-06-14 DIAGNOSIS — G8929 Other chronic pain: Secondary | ICD-10-CM | POA: Diagnosis not present

## 2015-06-14 DIAGNOSIS — I10 Essential (primary) hypertension: Secondary | ICD-10-CM | POA: Diagnosis not present

## 2015-06-14 DIAGNOSIS — W1839XA Other fall on same level, initial encounter: Secondary | ICD-10-CM | POA: Insufficient documentation

## 2015-06-14 DIAGNOSIS — Y998 Other external cause status: Secondary | ICD-10-CM | POA: Diagnosis not present

## 2015-06-14 DIAGNOSIS — W19XXXA Unspecified fall, initial encounter: Secondary | ICD-10-CM

## 2015-06-14 DIAGNOSIS — Z043 Encounter for examination and observation following other accident: Secondary | ICD-10-CM | POA: Diagnosis present
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
## 2015-06-14 NOTE — Discharge Instructions (Signed)
Return here as needed.  Follow-up with your primary care doctor °
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
## 2015-06-18 ENCOUNTER — Encounter (HOSPITAL_COMMUNITY): Payer: Self-pay

## 2015-06-18 ENCOUNTER — Encounter (HOSPITAL_COMMUNITY)
Admission: RE | Admit: 2015-06-18 | Discharge: 2015-06-18 | Disposition: A | Discharge status: Home / Self Care | Payer: Medicare Other | Source: Ambulatory Visit | Attending: Neurology | Admitting: Neurology

## 2015-06-18 VITALS — BP 130/70 | HR 64 | Temp 98.3°F | Resp 20 | Ht 73.0 in | Wt 292.4 lb

## 2015-06-18 DIAGNOSIS — G35 Multiple sclerosis: Secondary | ICD-10-CM | POA: Insufficient documentation

## 2015-06-18 MED ORDER — ACETAMINOPHEN 325 MG PO TABS
650.0000 mg | ORAL_TABLET | ORAL | Status: DC
  Administered 2015-06-18: 650 mg via ORAL
  Filled 2015-06-18: qty 2

## 2015-06-18 MED ORDER — LORATADINE 10 MG PO TABS
10.0000 mg | ORAL_TABLET | ORAL | Status: DC
  Administered 2015-06-18: 10 mg via ORAL
  Filled 2015-06-18: qty 1

## 2015-06-18 MED ORDER — SODIUM CHLORIDE 0.9 % IV SOLN
INTRAVENOUS | Status: AC
  Administered 2015-06-18: 250 mL via INTRAVENOUS

## 2015-06-18 MED ORDER — SODIUM CHLORIDE 0.9 % IV SOLN
500.0000 mg | INTRAVENOUS | Status: DC
  Filled 2015-06-18: qty 4

## 2015-06-18 MED ORDER — SODIUM CHLORIDE 0.9 % IV SOLN
300.0000 mg | INTRAVENOUS | Status: DC
  Administered 2015-06-18: 300 mg via INTRAVENOUS
  Filled 2015-06-18: qty 15
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
## 2015-06-18 NOTE — Progress Notes (Signed)
Uneventful Tysabri infusion.  Pt stayed 55 minutes post infusion.  Pt and his mother have to catch the [employer] transportation and they will arrive at 1600.  Pt d/c ambulatory with his mother.

## 2015-06-26 ENCOUNTER — Telehealth: Payer: Self-pay | Admitting: Neurology

## 2015-06-26 DIAGNOSIS — R269 Unspecified abnormalities of gait and mobility: Secondary | ICD-10-CM

## 2015-06-26 DIAGNOSIS — G35 Multiple sclerosis: Secondary | ICD-10-CM

## 2015-06-26 NOTE — Telephone Encounter (Signed)
I called the patient's mother. She wanted to let Dr. Anne Hahn know that the patient fell on their front porch today. No one saw the fall. EMS had to come to the home to help the patient up. The patient is in the bed now. The patient's mother stated that he has been drooling and having slurred speech for over a week. She states this has happened before and it is typically an issue with his psychiatry medications. She has been trying to get in touch with Jacki Cones from the psychiatry office and has not been successful in getting a response. She would like to know if Dr. Anne Hahn can order home PT for the patient as he is weak again. It is getting to the point that the patient's mother and father are having a hard time caring for him the way they used to be able to.

## 2015-06-26 NOTE — Telephone Encounter (Signed)
I called the mother. The patient remains on Haldol, this has resulted in some changes in walking and balance, increased drooling. This may be tapered off in the future. I will get physical therapy out to the house for gait training.

## 2015-06-26 NOTE — Telephone Encounter (Signed)
Pt's mother called and says her son ( pt) is in the bed now. She is wondering what her next steps should be. She says that he is slurring and drooling.She has been trying to get him off of some his psychological medications. Mother wants to know if in home health pt can be brought in. Please call and advise

## 2015-06-26 NOTE — Telephone Encounter (Signed)
Mom Runette called to advise patient fell, EMS is on the way, wants to know if Dr. Anne Hahn wants to consider another plan of treatment for patient.

## 2015-07-11 ENCOUNTER — Encounter (HOSPITAL_COMMUNITY): Payer: Medicare Other

## 2015-07-16 ENCOUNTER — Encounter (HOSPITAL_COMMUNITY)
Admission: RE | Admit: 2015-07-16 | Discharge: 2015-07-16 | Disposition: A | Discharge status: Home / Self Care | Payer: Medicare Other | Source: Ambulatory Visit | Attending: Neurology | Admitting: Neurology

## 2015-07-16 VITALS — BP 149/92 | HR 61 | Temp 97.7°F | Resp 20 | Ht 73.0 in

## 2015-07-16 DIAGNOSIS — G35 Multiple sclerosis: Secondary | ICD-10-CM | POA: Insufficient documentation

## 2015-07-16 MED ORDER — LORATADINE 10 MG PO TABS
10.0000 mg | ORAL_TABLET | ORAL | Status: AC
  Administered 2015-07-16: 10 mg via ORAL
  Filled 2015-07-16: qty 1

## 2015-07-16 MED ORDER — SODIUM CHLORIDE 0.9 % IV SOLN
INTRAVENOUS | Status: AC
  Administered 2015-07-16: 13:00:00 via INTRAVENOUS

## 2015-07-16 MED ORDER — SODIUM CHLORIDE 0.9 % IV SOLN
300.0000 mg | INTRAVENOUS | Status: DC
  Administered 2015-07-16: 300 mg via INTRAVENOUS
  Filled 2015-07-16: qty 15

## 2015-07-16 MED ORDER — ACETAMINOPHEN 325 MG PO TABS
650.0000 mg | ORAL_TABLET | ORAL | Status: AC
  Administered 2015-07-16: 650 mg via ORAL
  Filled 2015-07-16: qty 2

## 2015-07-16 NOTE — Progress Notes (Signed)
Uneventful infusion of TYSABRI. Pt was more communicative with Dad and Friend and at the end of the infusion followed my instructions to get into the w/c, Pt was discharged via w/c accompanied staff  to meet Dad and Friend at car

## 2015-07-16 NOTE — Progress Notes (Signed)
Pt arrived today for his Tysabri ambulatory accompanied by Dad and Friend. Pt is recliner but will not answer questions unless Dad is asking him to. Pt looking around and speaking in a whisper and occasionally smiling. Iv started without problem and patient was given his usual Sprite/cheese and crackers. He took his po premeds with some coaxing.Pt is watching TV then appears to drop off to sleep.

## 2015-08-09 ENCOUNTER — Other Ambulatory Visit: Payer: Self-pay | Admitting: Neurology

## 2015-08-09 DIAGNOSIS — G35 Multiple sclerosis: Secondary | ICD-10-CM

## 2015-08-13 ENCOUNTER — Encounter (HOSPITAL_COMMUNITY): Payer: Medicare Other

## 2015-08-15 ENCOUNTER — Encounter (HOSPITAL_COMMUNITY)
Admission: RE | Admit: 2015-08-15 | Discharge: 2015-08-15 | Disposition: A | Discharge status: Home / Self Care | Payer: Medicare Other | Source: Ambulatory Visit | Attending: Neurology | Admitting: Neurology

## 2015-08-15 ENCOUNTER — Encounter (HOSPITAL_COMMUNITY): Payer: Self-pay

## 2015-08-15 VITALS — BP 137/82 | HR 76 | Temp 97.6°F | Resp 18 | Ht 73.0 in | Wt 300.5 lb

## 2015-08-15 DIAGNOSIS — G35 Multiple sclerosis: Secondary | ICD-10-CM

## 2015-08-15 MED ORDER — NATALIZUMAB 300 MG/15ML IV CONC
300.0000 mg | INTRAVENOUS | Status: DC
  Administered 2015-08-15: 300 mg via INTRAVENOUS
  Filled 2015-08-15: qty 15

## 2015-08-15 MED ORDER — SODIUM CHLORIDE 0.9 % IV SOLN
INTRAVENOUS | Status: DC
Start: 2015-08-15 — End: 2015-08-16
  Administered 2015-08-15: 13:00:00 via INTRAVENOUS

## 2015-08-15 MED ORDER — LORATADINE 10 MG PO TABS
10.0000 mg | ORAL_TABLET | ORAL | Status: DC
  Administered 2015-08-15: 10 mg via ORAL
  Filled 2015-08-15: qty 1

## 2015-08-15 MED ORDER — ACETAMINOPHEN 325 MG PO TABS
650.0000 mg | ORAL_TABLET | ORAL | Status: DC
  Administered 2015-08-15: 650 mg via ORAL
  Filled 2015-08-15: qty 2

## 2015-08-15 NOTE — Discharge Instructions (Signed)

## 2015-08-15 NOTE — Progress Notes (Signed)
Post-infusion Tysabri, pt. Stayed 50 minutes with father, pt. Yelling out during infusion, talking under his breath, father at side most of infusion, pt. Just smiles at nurse, father answers most of questions. Pt. Ambulated to car with father.

## 2015-08-16 ENCOUNTER — Encounter (HOSPITAL_COMMUNITY): Payer: Self-pay | Admitting: *Deleted

## 2015-08-16 ENCOUNTER — Emergency Department (HOSPITAL_COMMUNITY)
Admission: EM | Admit: 2015-08-16 | Discharge: 2015-08-16 | Discharge status: Against Medical Advice | Payer: Medicare Other | Attending: Emergency Medicine | Admitting: Emergency Medicine

## 2015-08-16 DIAGNOSIS — Z9119 Patient's noncompliance with other medical treatment and regimen: Secondary | ICD-10-CM | POA: Diagnosis not present

## 2015-08-16 DIAGNOSIS — E669 Obesity, unspecified: Secondary | ICD-10-CM | POA: Insufficient documentation

## 2015-08-16 DIAGNOSIS — F17210 Nicotine dependence, cigarettes, uncomplicated: Secondary | ICD-10-CM | POA: Insufficient documentation

## 2015-08-16 DIAGNOSIS — I10 Essential (primary) hypertension: Secondary | ICD-10-CM | POA: Diagnosis not present

## 2015-08-16 DIAGNOSIS — Z79899 Other long term (current) drug therapy: Secondary | ICD-10-CM | POA: Insufficient documentation

## 2015-08-16 DIAGNOSIS — G8929 Other chronic pain: Secondary | ICD-10-CM | POA: Diagnosis not present

## 2015-08-16 DIAGNOSIS — R471 Dysarthria and anarthria: Secondary | ICD-10-CM | POA: Diagnosis not present

## 2015-08-16 DIAGNOSIS — F319 Bipolar disorder, unspecified: Secondary | ICD-10-CM | POA: Diagnosis not present

## 2015-08-16 DIAGNOSIS — R4182 Altered mental status, unspecified: Secondary | ICD-10-CM | POA: Diagnosis not present

## 2015-08-16 LAB — CBC
HCT: 38.7 % — ABNORMAL LOW (ref 39.0–52.0)
Hemoglobin: 11.9 g/dL — ABNORMAL LOW (ref 13.0–17.0)
MCH: 25.1 pg — ABNORMAL LOW (ref 26.0–34.0)
MCHC: 30.7 g/dL (ref 30.0–36.0)
MCV: 81.5 fL (ref 78.0–100.0)
Platelets: 134 10*3/uL — ABNORMAL LOW (ref 150–400)
RBC: 4.75 MIL/uL (ref 4.22–5.81)
RDW: 15.5 % (ref 11.5–15.5)
WBC: 6.5 10*3/uL (ref 4.0–10.5)

## 2015-08-16 LAB — COMPREHENSIVE METABOLIC PANEL
ALT: 42 U/L (ref 17–63)
AST: 18 U/L (ref 15–41)
Albumin: 4.2 g/dL (ref 3.5–5.0)
Alkaline Phosphatase: 84 U/L (ref 38–126)
Anion gap: 8 (ref 5–15)
BUN: 16 mg/dL (ref 6–20)
CO2: 25 mmol/L (ref 22–32)
Calcium: 9.2 mg/dL (ref 8.9–10.3)
Chloride: 107 mmol/L (ref 101–111)
Creatinine, Ser: 0.83 mg/dL (ref 0.61–1.24)
GFR calc Af Amer: >60 mL/min (ref >60)
GFR calc non Af Amer: >60 mL/min (ref >60)
Glucose, Bld: 84 mg/dL (ref 65–99)
Potassium: 4 mmol/L (ref 3.5–5.1)
Sodium: 140 mmol/L (ref 135–145)
Total Bilirubin: 0.7 mg/dL (ref 0.3–1.2)
Total Protein: 6.9 g/dL (ref 6.5–8.1)

## 2015-08-16 LAB — ACETAMINOPHEN LEVEL: Acetaminophen (Tylenol), Serum: <10 ug/mL — ABNORMAL LOW (ref 10–30)

## 2015-08-16 LAB — ETHANOL: Alcohol, Ethyl (B): <5 mg/dL (ref <5)

## 2015-08-16 LAB — SALICYLATE LEVEL: Salicylate Lvl: <4 mg/dL (ref 2.8–30.0)
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
## 2015-08-16 NOTE — ED Notes (Addendum)
Patient not found in room or lobby.  Monitor laying on table.
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
## 2015-08-21 ENCOUNTER — Encounter (HOSPITAL_COMMUNITY): Payer: Self-pay | Admitting: Family Medicine

## 2015-08-21 ENCOUNTER — Emergency Department (HOSPITAL_COMMUNITY)
Admission: EM | Admit: 2015-08-21 | Discharge: 2015-08-22 | Disposition: A | Discharge status: Home / Self Care | Payer: Medicare Other | Attending: Emergency Medicine | Admitting: Emergency Medicine

## 2015-08-21 DIAGNOSIS — F17210 Nicotine dependence, cigarettes, uncomplicated: Secondary | ICD-10-CM | POA: Diagnosis not present

## 2015-08-21 DIAGNOSIS — I10 Essential (primary) hypertension: Secondary | ICD-10-CM | POA: Diagnosis not present

## 2015-08-21 DIAGNOSIS — F319 Bipolar disorder, unspecified: Secondary | ICD-10-CM | POA: Insufficient documentation

## 2015-08-21 DIAGNOSIS — Z79899 Other long term (current) drug therapy: Secondary | ICD-10-CM | POA: Insufficient documentation

## 2015-08-21 DIAGNOSIS — F250 Schizoaffective disorder, bipolar type: Secondary | ICD-10-CM | POA: Diagnosis present

## 2015-08-21 DIAGNOSIS — F911 Conduct disorder, childhood-onset type: Secondary | ICD-10-CM | POA: Diagnosis present

## 2015-08-21 DIAGNOSIS — F209 Schizophrenia, unspecified: Secondary | ICD-10-CM | POA: Diagnosis not present

## 2015-08-21 DIAGNOSIS — F919 Conduct disorder, unspecified: Secondary | ICD-10-CM | POA: Insufficient documentation

## 2015-08-21 DIAGNOSIS — F259 Schizoaffective disorder, unspecified: Secondary | ICD-10-CM | POA: Diagnosis not present

## 2015-08-21 DIAGNOSIS — E669 Obesity, unspecified: Secondary | ICD-10-CM | POA: Insufficient documentation

## 2015-08-21 DIAGNOSIS — G8929 Other chronic pain: Secondary | ICD-10-CM | POA: Insufficient documentation

## 2015-08-21 DIAGNOSIS — F1210 Cannabis abuse, uncomplicated: Secondary | ICD-10-CM | POA: Diagnosis not present

## 2015-08-21 DIAGNOSIS — Z9119 Patient's noncompliance with other medical treatment and regimen: Secondary | ICD-10-CM | POA: Diagnosis not present

## 2015-08-21 DIAGNOSIS — IMO0002 Reserved for concepts with insufficient information to code with codable children: Secondary | ICD-10-CM

## 2015-08-21 LAB — CBC
HCT: 38.8 % — ABNORMAL LOW (ref 39.0–52.0)
Hemoglobin: 11.9 g/dL — ABNORMAL LOW (ref 13.0–17.0)
MCH: 25.2 pg — ABNORMAL LOW (ref 26.0–34.0)
MCHC: 30.7 g/dL (ref 30.0–36.0)
MCV: 82 fL (ref 78.0–100.0)
Platelets: 144 10*3/uL — ABNORMAL LOW (ref 150–400)
RBC: 4.73 MIL/uL (ref 4.22–5.81)
RDW: 15.4 % (ref 11.5–15.5)
WBC: 6.4 10*3/uL (ref 4.0–10.5)

## 2015-08-21 LAB — COMPREHENSIVE METABOLIC PANEL
ALT: 37 U/L (ref 17–63)
AST: 19 U/L (ref 15–41)
Albumin: 4.3 g/dL (ref 3.5–5.0)
Alkaline Phosphatase: 88 U/L (ref 38–126)
Anion gap: 10 (ref 5–15)
BUN: 21 mg/dL — ABNORMAL HIGH (ref 6–20)
CO2: 25 mmol/L (ref 22–32)
Calcium: 9.4 mg/dL (ref 8.9–10.3)
Chloride: 107 mmol/L (ref 101–111)
Creatinine, Ser: 0.92 mg/dL (ref 0.61–1.24)
GFR calc Af Amer: >60 mL/min (ref >60)
GFR calc non Af Amer: >60 mL/min (ref >60)
Glucose, Bld: 106 mg/dL — ABNORMAL HIGH (ref 65–99)
Potassium: 3.4 mmol/L — ABNORMAL LOW (ref 3.5–5.1)
Sodium: 142 mmol/L (ref 135–145)
Total Bilirubin: 0.7 mg/dL (ref 0.3–1.2)
Total Protein: 7.3 g/dL (ref 6.5–8.1)

## 2015-08-21 LAB — ETHANOL: Alcohol, Ethyl (B): <5 mg/dL (ref <5)

## 2015-08-21 LAB — ACETAMINOPHEN LEVEL: Acetaminophen (Tylenol), Serum: <10 ug/mL — ABNORMAL LOW (ref 10–30)

## 2015-08-21 LAB — SALICYLATE LEVEL: Salicylate Lvl: <4 mg/dL (ref 2.8–30.0)

## 2015-08-21 NOTE — ED Notes (Signed)
Pt has in belonging bag:  Red t-shrt, black t-shirt, grey shoes, black pants, light blue socks.

## 2015-08-21 NOTE — ED Notes (Signed)
Patient has attempted to urinate x 2 but unable to urinate.
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
## 2015-08-21 NOTE — ED Notes (Signed)
Mother wants to be called before patient is discharged so he is not discharged and wandering the streets. Name and number in demographics.

## 2015-08-22 ENCOUNTER — Encounter (HOSPITAL_COMMUNITY): Payer: Self-pay | Admitting: Emergency Medicine

## 2015-08-22 ENCOUNTER — Emergency Department (EMERGENCY_DEPARTMENT_HOSPITAL)
Admission: EM | Admit: 2015-08-22 | Discharge: 2015-08-25 | Disposition: A | Discharge status: Home / Self Care | Payer: Medicare Other | Source: Home / Self Care | Attending: Physician Assistant | Admitting: Physician Assistant

## 2015-08-22 DIAGNOSIS — F919 Conduct disorder, unspecified: Secondary | ICD-10-CM | POA: Diagnosis not present

## 2015-08-22 DIAGNOSIS — E669 Obesity, unspecified: Secondary | ICD-10-CM | POA: Insufficient documentation

## 2015-08-22 DIAGNOSIS — I10 Essential (primary) hypertension: Secondary | ICD-10-CM | POA: Insufficient documentation

## 2015-08-22 DIAGNOSIS — F259 Schizoaffective disorder, unspecified: Secondary | ICD-10-CM

## 2015-08-22 DIAGNOSIS — Z79899 Other long term (current) drug therapy: Secondary | ICD-10-CM | POA: Insufficient documentation

## 2015-08-22 DIAGNOSIS — Z9119 Patient's noncompliance with other medical treatment and regimen: Secondary | ICD-10-CM | POA: Insufficient documentation

## 2015-08-22 DIAGNOSIS — F319 Bipolar disorder, unspecified: Secondary | ICD-10-CM | POA: Insufficient documentation

## 2015-08-22 DIAGNOSIS — F17210 Nicotine dependence, cigarettes, uncomplicated: Secondary | ICD-10-CM | POA: Insufficient documentation

## 2015-08-22 LAB — COMPREHENSIVE METABOLIC PANEL
ALT: 34 U/L (ref 17–63)
AST: 18 U/L (ref 15–41)
Albumin: 4.4 g/dL (ref 3.5–5.0)
Alkaline Phosphatase: 90 U/L (ref 38–126)
Anion gap: 8 (ref 5–15)
BUN: 18 mg/dL (ref 6–20)
CO2: 28 mmol/L (ref 22–32)
Calcium: 9.2 mg/dL (ref 8.9–10.3)
Chloride: 104 mmol/L (ref 101–111)
Creatinine, Ser: 1 mg/dL (ref 0.61–1.24)
GFR calc Af Amer: >60 mL/min (ref >60)
GFR calc non Af Amer: >60 mL/min (ref >60)
Glucose, Bld: 95 mg/dL (ref 65–99)
Potassium: 3.8 mmol/L (ref 3.5–5.1)
Sodium: 140 mmol/L (ref 135–145)
Total Bilirubin: 0.5 mg/dL (ref 0.3–1.2)
Total Protein: 7 g/dL (ref 6.5–8.1)

## 2015-08-22 LAB — CBC WITH DIFFERENTIAL/PLATELET
Basophils Absolute: 0 10*3/uL (ref 0.0–0.1)
Basophils Relative: 1 %
Eosinophils Absolute: 0.4 10*3/uL (ref 0.0–0.7)
Eosinophils Relative: 5 %
HCT: 39.4 % (ref 39.0–52.0)
Hemoglobin: 12.7 g/dL — ABNORMAL LOW (ref 13.0–17.0)
Lymphocytes Relative: 41 %
Lymphs Abs: 3.1 10*3/uL (ref 0.7–4.0)
MCH: 26 pg (ref 26.0–34.0)
MCHC: 32.2 g/dL (ref 30.0–36.0)
MCV: 80.7 fL (ref 78.0–100.0)
Monocytes Absolute: 0.6 10*3/uL (ref 0.1–1.0)
Monocytes Relative: 8 %
Neutro Abs: 3.4 10*3/uL (ref 1.7–7.7)
Neutrophils Relative %: 45 %
Platelets: 148 10*3/uL — ABNORMAL LOW (ref 150–400)
RBC: 4.88 MIL/uL (ref 4.22–5.81)
RDW: 15 % (ref 11.5–15.5)
WBC: 7.5 10*3/uL (ref 4.0–10.5)

## 2015-08-22 LAB — ACETAMINOPHEN LEVEL: Acetaminophen (Tylenol), Serum: <10 ug/mL — ABNORMAL LOW (ref 10–30)

## 2015-08-22 LAB — ETHANOL: Alcohol, Ethyl (B): <5 mg/dL (ref <5)

## 2015-08-22 LAB — RAPID URINE DRUG SCREEN, HOSP PERFORMED
Amphetamines: NOT DETECTED
Barbiturates: NOT DETECTED
Benzodiazepines: NOT DETECTED
Cocaine: NOT DETECTED
Opiates: NOT DETECTED
Tetrahydrocannabinol: POSITIVE — AB

## 2015-08-22 LAB — SALICYLATE LEVEL: Salicylate Lvl: <4 mg/dL (ref 2.8–30.0)

## 2015-08-22 MED ORDER — ACETAMINOPHEN 325 MG PO TABS
650.0000 mg | ORAL_TABLET | ORAL | Status: DC | PRN
  Administered 2015-08-22: 650 mg via ORAL
  Filled 2015-08-22: qty 2

## 2015-08-22 MED ORDER — MELATONIN 5 MG PO TABS: 10.0000 mg | ORAL_TABLET | Freq: Every day | ORAL | Status: DC

## 2015-08-22 MED ORDER — IBUPROFEN 200 MG PO TABS: 600.0000 mg | ORAL_TABLET | Freq: Three times a day (TID) | ORAL | Status: DC | PRN

## 2015-08-22 MED ORDER — ZOLPIDEM TARTRATE 5 MG PO TABS: 5.0000 mg | ORAL_TABLET | Freq: Every evening | ORAL | Status: DC | PRN

## 2015-08-22 MED ORDER — DOCUSATE SODIUM 100 MG PO CAPS
100.0000 mg | ORAL_CAPSULE | Freq: Two times a day (BID) | ORAL | Status: DC
  Administered 2015-08-22 (×2): 100 mg via ORAL
  Filled 2015-08-22 (×2): qty 1

## 2015-08-22 MED ORDER — BENZTROPINE MESYLATE 1 MG PO TABS
1.0000 mg | ORAL_TABLET | Freq: Two times a day (BID) | ORAL | Status: DC
  Administered 2015-08-22 (×2): 1 mg via ORAL
  Filled 2015-08-22 (×2): qty 1

## 2015-08-22 MED ORDER — AMLODIPINE BESYLATE 10 MG PO TABS
10.0000 mg | ORAL_TABLET | Freq: Every day | ORAL | Status: DC
  Administered 2015-08-22: 10 mg via ORAL
  Filled 2015-08-22: qty 1

## 2015-08-22 MED ORDER — VITAMIN D (ERGOCALCIFEROL) 1.25 MG (50000 UNIT) PO CAPS: 50000.0000 [IU] | ORAL_CAPSULE | ORAL | Status: DC

## 2015-08-22 MED ORDER — LORAZEPAM 1 MG PO TABS
1.0000 mg | ORAL_TABLET | Freq: Three times a day (TID) | ORAL | Status: DC | PRN
Start: 2015-08-22 — End: 2015-08-25
  Administered 2015-08-22 – 2015-08-24 (×4): 1 mg via ORAL
  Filled 2015-08-22 (×4): qty 1

## 2015-08-22 MED ORDER — HALOPERIDOL 5 MG PO TABS: 5.0000 mg | ORAL_TABLET | Freq: Three times a day (TID) | ORAL | Status: DC | PRN

## 2015-08-22 MED ORDER — TRAZODONE HCL 100 MG PO TABS
200.0000 mg | ORAL_TABLET | Freq: Every day | ORAL | Status: DC
  Administered 2015-08-22: 200 mg via ORAL
  Filled 2015-08-22: qty 2

## 2015-08-22 MED ORDER — IBUPROFEN 200 MG PO TABS
600.0000 mg | ORAL_TABLET | Freq: Three times a day (TID) | ORAL | Status: DC | PRN
  Filled 2015-08-22: qty 3

## 2015-08-22 MED ORDER — ACETAMINOPHEN 325 MG PO TABS: 650.0000 mg | ORAL_TABLET | ORAL | Status: DC | PRN

## 2015-08-22 MED ORDER — LORAZEPAM 1 MG PO TABS: 1.0000 mg | ORAL_TABLET | Freq: Three times a day (TID) | ORAL | Status: DC | PRN

## 2015-08-22 MED ORDER — OLANZAPINE 10 MG PO TBDP
10.0000 mg | ORAL_TABLET | Freq: Every day | ORAL | Status: DC
  Administered 2015-08-22: 10 mg via ORAL
  Filled 2015-08-22: qty 1

## 2015-08-22 MED ORDER — OXCARBAZEPINE 300 MG PO TABS
600.0000 mg | ORAL_TABLET | Freq: Two times a day (BID) | ORAL | Status: DC
  Administered 2015-08-22 (×2): 600 mg via ORAL
  Filled 2015-08-22 (×2): qty 2

## 2015-08-22 MED ORDER — ONDANSETRON HCL 4 MG PO TABS: 4.0000 mg | ORAL_TABLET | Freq: Three times a day (TID) | ORAL | Status: DC | PRN

## 2015-08-22 MED ORDER — HYDROXYZINE HCL 25 MG PO TABS: 25.0000 mg | ORAL_TABLET | Freq: Three times a day (TID) | ORAL | Status: DC | PRN

## 2015-08-22 MED ORDER — PSYLLIUM 95 % PO PACK
1.0000 | PACK | Freq: Every day | ORAL | Status: DC
  Administered 2015-08-22: 1 via ORAL
  Filled 2015-08-22: qty 1

## 2015-08-22 MED ORDER — ALUM & MAG HYDROXIDE-SIMETH 200-200-20 MG/5ML PO SUSP: 30.0000 mL | ORAL | Status: DC | PRN

## 2015-08-22 MED ORDER — PROPRANOLOL HCL 10 MG PO TABS
10.0000 mg | ORAL_TABLET | Freq: Two times a day (BID) | ORAL | Status: DC
  Administered 2015-08-22 (×2): 10 mg via ORAL
  Filled 2015-08-22 (×3): qty 1

## 2015-08-22 NOTE — Progress Notes (Signed)
PHARMACIST - PHYSICIAN ORDER COMMUNICATION  CONCERNING: P&T Medication Policy on Herbal Medications  DESCRIPTION:  This patient's order for:  Melatonin  has been noted.  This product(s) is classified as an "herbal" or natural product. Due to a lack of definitive safety studies or FDA approval, nonstandard manufacturing practices, plus the potential risk of unknown drug-drug interactions while on inpatient medications, the Pharmacy and Therapeutics Committee does not permit the use of "herbal" or natural products of this type within [facility].   ACTION TAKEN: The pharmacy department is unable to verify this order at this time and your patient has been informed of this safety policy. Please reevaluate patient's clinical condition at discharge and address if the herbal or natural product(s) should be resumed at that time.   Thanks Lorenza Evangelist 08/22/2015 12:29 AM

## 2015-08-22 NOTE — ED Notes (Signed)
Ray is in bed sleeping.  Family member called while Clinical research associate was on break.  Instructed to call back but did not.

## 2015-08-22 NOTE — ED Notes (Signed)
Pt comes to Ed , with behavioral issues, and hearing voices, and has a hx of mental disorders. No pain, pt  May have not been taking his medications properly. Vs on arrival CbG 143, bp 134/84, pulse 80, rr 18.
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
## 2015-08-22 NOTE — ED Notes (Signed)
Pt discharged home per MD order. Discharge summary reviewed with pt. Pt denies SI/HI. Denies AVH, denies physical pain at discharge. Pt forwards little with this nurse. Pt signed for personal belongings. ED lobby called and informed nurse Pt family member in ED lobby for discharge. Pt ambulatory off of unit with nursing staff to lobby.
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
## 2015-08-22 NOTE — ED Notes (Addendum)
Pt Brandon [name] admitted to room 43 at 00:50.  Pt responds to questions if redirected several times while calling his name.  Pt states he screwed up bad today but will not elaborate on circumstances.  Pt will stare at this writer and not respond.  Patient unable to verbalize why he is here other than "screwing up bad".  Pt is able to state that he knows he is in the hospital.  Pt hungry and wants to watch TV.  Turning lights on causes loud negative response from patient.  Report from "Abby" states patient is ADHD.   Pt given sandwich, crackers and juice and is in bed watching TV.  Meds. given per order.  Pt encouraged to verbalize and pain, SI, HI or AV/H.
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
## 2015-08-22 NOTE — ED Notes (Signed)
Mrs. Banik (Pts Mom) [phone] - asking for an update.

## 2015-08-23 DIAGNOSIS — F259 Schizoaffective disorder, unspecified: Secondary | ICD-10-CM | POA: Diagnosis not present

## 2015-08-23 DIAGNOSIS — F919 Conduct disorder, unspecified: Secondary | ICD-10-CM | POA: Diagnosis not present

## 2015-08-23 MED ORDER — PSYLLIUM 95 % PO PACK
1.0000 | PACK | Freq: Every day | ORAL | Status: DC
  Administered 2015-08-23 – 2015-08-25 (×3): 1 via ORAL
  Filled 2015-08-23 (×3): qty 1

## 2015-08-23 MED ORDER — PROPRANOLOL HCL 10 MG PO TABS
10.0000 mg | ORAL_TABLET | Freq: Two times a day (BID) | ORAL | Status: DC
  Administered 2015-08-23 – 2015-08-25 (×4): 10 mg via ORAL
  Filled 2015-08-23 (×7): qty 1

## 2015-08-23 MED ORDER — OLANZAPINE 10 MG PO TBDP
10.0000 mg | ORAL_TABLET | Freq: Every day | ORAL | Status: DC
  Administered 2015-08-23 – 2015-08-24 (×2): 10 mg via ORAL
  Filled 2015-08-23 (×2): qty 1

## 2015-08-23 MED ORDER — DOCUSATE SODIUM 100 MG PO CAPS
100.0000 mg | ORAL_CAPSULE | Freq: Two times a day (BID) | ORAL | Status: DC
  Administered 2015-08-23 – 2015-08-25 (×5): 100 mg via ORAL
  Filled 2015-08-23 (×5): qty 1

## 2015-08-23 MED ORDER — AMLODIPINE BESYLATE 10 MG PO TABS
10.0000 mg | ORAL_TABLET | Freq: Every day | ORAL | Status: DC
  Administered 2015-08-23 – 2015-08-25 (×3): 10 mg via ORAL
  Filled 2015-08-23 (×3): qty 1

## 2015-08-23 MED ORDER — TRAZODONE HCL 100 MG PO TABS
200.0000 mg | ORAL_TABLET | Freq: Every day | ORAL | Status: DC
  Administered 2015-08-23 – 2015-08-24 (×2): 200 mg via ORAL
  Filled 2015-08-23 (×2): qty 2

## 2015-08-23 MED ORDER — BENZTROPINE MESYLATE 1 MG PO TABS
1.0000 mg | ORAL_TABLET | Freq: Two times a day (BID) | ORAL | Status: DC
  Administered 2015-08-23 – 2015-08-25 (×5): 1 mg via ORAL
  Filled 2015-08-23 (×5): qty 1

## 2015-08-23 MED ORDER — MELATONIN 5 MG PO TABS: 10.0000 mg | ORAL_TABLET | Freq: Every day | ORAL | Status: DC

## 2015-08-23 NOTE — ED Notes (Addendum)
Pt is asleep on his right side and does not appear to be in any distress. 8am - Phoned pharmacy for a med reconcilation. Report to [place]. (12:45pm)

## 2015-08-23 NOTE — BH Assessment (Signed)
[facility] Assessment Progress Note  The following facilities have been contacted to seek placement for this pt, with results as noted:   Beds available, information sent, decision pending:   [place]  [facility] [place]   At capacity:   [place]  Carin [place], [place]  Triage Specialist  [phone]

## 2015-08-23 NOTE — BH Assessment (Signed)
[facility] Assessment Progress Note  At 14:48 this Clinical research associate called Lysle Rubens with the [facility], pt's outpatient provider, to notify him that pt is in ED and that placement is being sought for him.  Doylene Canning, MA Triage Specialist [phone]

## 2015-08-23 NOTE — ED Notes (Signed)
Placed pt belongings in locker 33.

## 2015-08-23 NOTE — Consult Note (Signed)
[facility] [place] [facility] Consult   Reason for Consult:  Agitation, Anger  Referring Physician:  EDP Patient Identification: Brandon [name] MRN:  [id number] Principal Diagnosis: Schizoaffective disorder, unspecified type [facility]) Diagnosis:   Patient Active Problem List   Diagnosis Date Noted  . Schizoaffective disorder, unspecified type ([place]) [F25.9]     Priority: High  . Schizoaffective disorder ([place]) [F25.9]   . Hallucinations [R44.3] 08/05/2014  . Auditory hallucination [R44.0]   . Homicidal ideation [R45.850]   . Left-sided weakness [M62.89] 01/18/2014  . Multiple sclerosis ([place]) [G35] 05/20/2013  . White matter abnormality on MRI of brain [R93.0] 11/23/2012  . Abnormality of gait [R26.9] 11/23/2012  . HTN (hypertension) [I10] 07/04/2011  . Ataxia [R27.0] 07/02/2011  . OBESITY [E66.9] 01/14/2010  . ADHD [F90.9] 01/14/2010    Total Time spent with patient: 45 minutes  Subjective:   Brandon [name] is a 25 y.o. male patient admitted with  Agitation, Anger  HPI:  [name], 24 years old was evaluated for agitation and anger.  Patient was discharged from here, er yesterday for behavior issues.  Patient stays with his mother and  has an ACT team.  Patient states his brother annoys him and that he became aggressive towards him.  Patient stated that he is not able to stay with his brother in the same house.  Patient admits to taking his medications and states his mother offers them to him.  He denies SI/HI/AVH. Collateral from his mother, MS [name] is that patient is not happy his brother came to the house.  He was doing well until his brother came in.  Mother states she is now working with his ACT team staff to seek assisted living facility placement for him.  We have accepted patient for admission and we will be seeking placement at any facility with available bed.  Past Psychiatric History: Schizoaffective disorder,   Risk to Self: Suicidal Ideation: No-Not Currently/Within Last  6 Months Suicidal Intent: No-Not Currently/Within Last 6 Months Is patient at risk for suicide?: Yes Suicidal Plan?: No-Not Currently/Within Last 6 Months Access to Means: No What has been your use of drugs/alcohol within the last 12 months?: Marijuana How many times?: 0 Other Self Harm Risks: UTA Triggers for Past Attempts: None known Intentional Self Injurious Behavior: None Risk to Others: Homicidal Ideation: No-Not Currently/Within Last 6 Months Thoughts of Harm to Others: No-Not Currently Present/Within Last 6 Months Current Homicidal Intent: No Current Homicidal Plan: No Access to Homicidal Means: No Identified Victim: No one History of harm to others?:  (Unknown) Assessment of Violence: None Noted Violent Behavior Description: Unknown Does patient have access to weapons?: No Criminal Charges Pending?: No Does patient have a court date: No Prior Inpatient Therapy: Prior Inpatient Therapy: Yes Prior Therapy Dates: Unknown Prior Therapy Facilty/Provider(s): Unknown Reason for Treatment: Psychosis Prior Outpatient Therapy: Prior Outpatient Therapy: Yes Prior Therapy Dates: Ongoing Prior Therapy Facilty[facility]Provider[facility]): [facility] [phone] Reason for Treatment: MH issues Does patient have an ACCT team?: Yes Does patient have Intensive In-House Services?  : No Does patient have [facility] services? : No Does patient have P4CC services?: No  Past Medical History:  Past Medical History  Diagnosis Date  . ADHD (attention deficit hyperactivity disorder)   . Hypertension   . Schizophrenia ([place])   . Bipolar 1 disorder ([place])   . 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 Obesity   . Multiple sclerosis ([place]) 05/20/2013    left sided weakness, dysarthria  .  Chronic back pain   . Non-compliance   . Chronic neck pain     Past Surgical History  Procedure Laterality Date  . None     Family History:  Family History  Problem Relation Age of Onset   . Diabetes Mother   . ADD / ADHD Brother    Family Psychiatric  History:  Denies. Social History:  History  Alcohol Use  . 0.0 oz/week  . 0 Standard drinks or equivalent per week    Comment: "A little bit"      History  Drug Use  . Yes  . Special: Marijuana    Comment: Last used: unknown     Social History   Social History  . Marital Status: Single    Spouse Name: N/A  . Number of Children: 0  . Years of Education: 11th   Occupational History  .      Disbaled   Social History Main Topics  . Smoking status: Current Every Day Smoker -- 0.25 packs/day    Types: Cigarettes  . Smokeless tobacco: Never Used  . Alcohol Use: 0.0 oz/week    0 Standard drinks or equivalent per week     Comment: "A little bit"   . Drug Use: Yes    Special: Marijuana     Comment: Last used: unknown   . Sexual Activity: Not Asked   Other Topics Concern  . None   Social History Narrative   Patient lives at home with his mother.   Disabled.   Education 11 th grade .   Right handed.   Caffeine - one cup daily soda.   Additional Social History:    Allergies:  No Known Allergies  Labs:  Results for orders placed or performed during the hospital encounter of 08/22/15 (from the past 48 hour(s))  CBC with Differential/Platelet     Status: Abnormal   Collection Time: 08/22/15 10:35 PM  Result Value Ref Range   WBC 7.5 4.0 - 10.5 K/uL   RBC 4.88 4.22 - 5.81 MIL/uL   Hemoglobin 12.7 (L) 13.0 - 17.0 g/dL   HCT 39.4 39.0 - 52.0 %   MCV 80.7 78.0 - 100.0 fL   MCH 26.0 26.0 - 34.0 pg   MCHC 32.2 30.0 - 36.0 g/dL   RDW 15.0 11.5 - 15.5 %   Platelets 148 (L) 150 - 400 K/uL   Neutrophils Relative % 45 %   Neutro Abs 3.4 1.7 - 7.7 K/uL   Lymphocytes Relative 41 %   Lymphs Abs 3.1 0.7 - 4.0 K/uL   Monocytes Relative 8 %   Monocytes Absolute 0.6 0.1 - 1.0 K/uL   Eosinophils Relative 5 %   Eosinophils Absolute 0.4 0.0 - 0.7 K/uL   Basophils Relative 1 %   Basophils Absolute 0.0 0.0 - 0.1  K/uL  Comprehensive metabolic panel     Status: None   Collection Time: 08/22/15 10:35 PM  Result Value Ref Range   Sodium 140 135 - 145 mmol/L   Potassium 3.8 3.5 - 5.1 mmol/L   Chloride 104 101 - 111 mmol/L   CO2 28 22 - 32 mmol/L   Glucose, Bld 95 65 - 99 mg/dL   BUN 18 6 - 20 mg/dL   Creatinine, Ser 1.00 0.61 - 1.24 mg/dL   Calcium 9.2 8.9 - 10.3 mg/dL   Total Protein 7.0 6.5 - 8.1 g/dL   Albumin 4.4 3.5 - 5.0 g/dL   AST 18 15 - 41 U/L  ALT 34 17 - 63 U/L   Alkaline Phosphatase 90 38 - 126 U/L   Total Bilirubin 0.5 0.3 - 1.2 mg/dL   GFR calc non Af Amer >60 >60 mL/min   GFR calc Af Amer >60 >60 mL/min    Comment: (NOTE) The eGFR has been calculated using the CKD EPI equation. This calculation has not been validated in all clinical situations. eGFR's persistently <60 mL/min signify possible Chronic Kidney Disease.    Anion gap 8 5 - 15  Acetaminophen level     Status: Abnormal   Collection Time: 08/22/15 10:35 PM  Result Value Ref Range   Acetaminophen (Tylenol), Serum <10 (L) 10 - 30 ug/mL    Comment:        THERAPEUTIC CONCENTRATIONS VARY SIGNIFICANTLY. A RANGE OF 10-30 ug/mL MAY BE AN EFFECTIVE CONCENTRATION FOR MANY PATIENTS. HOWEVER, SOME ARE BEST TREATED AT CONCENTRATIONS OUTSIDE THIS RANGE. ACETAMINOPHEN CONCENTRATIONS >150 ug/mL AT 4 HOURS AFTER INGESTION AND >50 ug/mL AT 12 HOURS AFTER INGESTION ARE OFTEN ASSOCIATED WITH TOXIC REACTIONS.   Ethanol     Status: None   Collection Time: 08/22/15 10:35 PM  Result Value Ref Range   Alcohol, Ethyl (B) <5 <5 mg/dL    Comment:        LOWEST DETECTABLE LIMIT FOR SERUM ALCOHOL IS 5 mg/dL FOR MEDICAL PURPOSES ONLY   Salicylate level     Status: None   Collection Time: 08/22/15 10:35 PM  Result Value Ref Range   Salicylate Lvl <6.5 2.8 - 30.0 mg/dL    Current Facility-Administered Medications  Medication Dose Route Frequency Provider Last Rate Last Dose  . acetaminophen (TYLENOL) tablet 650 mg  650 mg  Oral Q4H PRN Courteney Lyn Mackuen, MD   650 mg at 08/22/15 2359  . amLODipine (NORVASC) tablet 10 mg  10 mg Oral Daily Davonna Belling, MD   10 mg at 08/23/15 1135  . benztropine (COGENTIN) tablet 1 mg  1 mg Oral BID Davonna Belling, MD   1 mg at 08/23/15 1136  . docusate sodium (COLACE) capsule 100 mg  100 mg Oral BID Davonna Belling, MD   100 mg at 08/23/15 1136  . ibuprofen (ADVIL,MOTRIN) tablet 600 mg  600 mg Oral Q8H PRN Courteney Lyn Mackuen, MD      . LORazepam (ATIVAN) tablet 1 mg  1 mg Oral Q8H PRN Courteney Lyn Mackuen, MD   1 mg at 08/22/15 2359  . natalizumab (TYSABRI) 300 mg in sodium chloride 0.9 % 100 mL IVPB  300 mg Intravenous Q28 days Kathrynn Ducking, MD      . OLANZapine zydis (ZYPREXA) disintegrating tablet 10 mg  10 mg Oral QHS Davonna Belling, MD      . propranolol (INDERAL) tablet 10 mg  10 mg Oral BID Davonna Belling, MD   10 mg at 08/23/15 1241  . psyllium (HYDROCIL/METAMUCIL) packet 1 packet  1 packet Oral Daily Davonna Belling, MD   1 packet at 08/23/15 1241  . traZODone (DESYREL) tablet 200 mg  200 mg Oral QHS Davonna Belling, MD       Current Outpatient Prescriptions  Medication Sig Dispense Refill  . amLODipine (NORVASC) 10 MG tablet Take 10 mg by mouth daily.    . benztropine (COGENTIN) 1 MG tablet Take 1 tablet (1 mg total) by mouth 2 (two) times daily. 60 tablet 0  . docusate sodium (COLACE) 100 MG capsule Take 100 mg by mouth 2 (two) times daily.     Lorayne Bender SUSTENNA 234  MG/1.5ML SUSP injection Inject 234 mg as directed every 21 ( twenty-one) days.    . Melatonin 5 MG TABS Take 10 mg by mouth at bedtime.     Marland Kitchen OLANZapine zydis (ZYPREXA) 10 MG disintegrating tablet Take 10 mg by mouth at bedtime.    . propranolol (INDERAL) 10 MG tablet Take 1 tablet (10 mg total) by mouth 2 (two) times daily. 60 tablet 0  . psyllium (METAMUCIL) 58.6 % packet Take 1 packet by mouth daily.    . traZODone (DESYREL) 100 MG tablet Take 200 mg by mouth at bedtime.     .  Vitamin D, Ergocalciferol, (DRISDOL) 50000 UNITS CAPS capsule Take 50,000 Units by mouth every 7 (seven) days.      Musculoskeletal: Strength & Muscle Tone: within normal limits Gait & Station: normal Patient leans: N/A  Psychiatric Specialty Exam: Review of Systems  Constitutional: Negative.   HENT: Negative.   Eyes: Negative.   Respiratory: Negative.   Cardiovascular: Negative.   Gastrointestinal: Negative.   Genitourinary: Negative.   Musculoskeletal: Negative.   Skin: Negative.   Neurological: Negative.   Endo/Heme/Allergies: Negative.   see PMH,  As documented.  Blood pressure 106/72, pulse 72, temperature 97.6 F (36.4 C), temperature source Axillary, resp. rate 18, SpO2 99 %.There is no weight on file to calculate BMI.  General Appearance: Casual and Disheveled  Eye Contact::  Fair  Speech:  Clear and Coherent and Slow  Volume:  Normal  Mood:  Angry, Anxious and Depressed  Affect:  Congruent  Thought Process:  Coherent  Orientation:  Full (Time, Place, and Person)  Thought Content:  WDL  Suicidal Thoughts:  No  Homicidal Thoughts:  No  Memory:  Immediate;   Fair Recent;   Fair Remote;   Fair  Judgement:  Poor  Insight:  Shallow  Psychomotor Activity:  Psychomotor Retardation  Concentration:  Fair  Recall:  [place]: Fair  Akathisia:  NA  Handed:  Right  AIMS (if indicated):     Assets:  Desire for Improvement Housing  ADL's:  Impaired  Cognition: WNL  Sleep:      Treatment Plan Summary: Daily contact with patient to assess and evaluate symptoms and progress in treatment and Medication management  Disposition: Accepted for admission and we will be seeking placement at any facility with available bed.  We have resumed his home medications.  Delfin Gant, NP   PMHNP-BC 08/23/2015 2:13 PM Patient seen face-to-face for psychiatric evaluation, chart reviewed and case discussed with the physician extender and developed  treatment plan. Reviewed the information documented and agree with the treatment plan. Corena Pilgrim, MD

## 2015-08-23 NOTE — BH Assessment (Signed)
Tele Assessment Note   Brandon [name] is an 25 y.o. male.  -Clinician reviewed note by Dr. Corlis Leak.  Patient was brought in by police after a family disturbance. Patient was seen less than 24 hours ago by TTS.  Patient had been told to contact ACTT team and returned home.    Patient was very sleepy at time of assessment.  He was unable to give much information.  He did say he had no HI, SI.  Patient is unclear about psychosis.     [name] is his guardian of person.  Her number is [phone] and was not able to be contacted earlier.  Patient does have a history of trying to leave the home.  He has MS and uses a walker.  [name] has worried in the past (see assessment done on 05-15-15) that patient would leave the house and fall and injure himself.  Paitent has received [facility]T team services from [facility] [phone].  He has had  Them for the last year or more.    -Clinician discussed patient care with Donell Sievert, PA who recommended that he be seen again by psychiatry in the AM.    Diagnosis: Schizophrenia; THC use d/o   Past Medical History:  Past Medical History  Diagnosis Date  . ADHD (attention deficit hyperactivity disorder)   . Hypertension   . Schizophrenia ([facility])   . Bipolar 1 disorder ([facility])   . 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 Obesity   . Multiple sclerosis ([facility]) 05/20/2013    left sided weakness, dysarthria  . Chronic back pain   . Non-compliance   . Chronic neck pain     Past Surgical History  Procedure Laterality Date  . None      Family History:  Family History  Problem Relation Age of Onset  . Diabetes Mother   . ADD / ADHD Brother     Social History:  reports that he has been smoking Cigarettes.  He has been smoking about 0.25 packs per day. He has never used smokeless tobacco. He reports that he drinks alcohol. He reports that he uses illicit drugs (Marijuana).  Additional Social History:   Alcohol / Drug Use Pain Medications: See PTA medication list Prescriptions: See PTA medication list Over the Counter: See PTA medication list History of alcohol / drug use?: Yes Substance #1 Name of Substance 1: Marijuana 1 - Age of First Use: Teens 1 - Amount (size/oz): Varies 1 - Frequency: Daily 1 - Duration: on-going 1 - Last Use / Amount: 08/21/15  CIWA: CIWA-Ar BP: 128/82 mmHg Pulse Rate: 72 COWS:    PATIENT STRENGTHS: (choose at least two) Capable of independent living Communication skills Supportive family/friends  Allergies: No Known Allergies  Home Medications:  (Not in a hospital admission)  OB/GYN Status:  No LMP for male patient.  General Assessment Data Location of Assessment: [facility] TTS Assessment: In system Is this a Tele or Face-to-Face Assessment?: Face-to-Face Is this an Initial Assessment or a Re-assessment for this encounter?: Initial Assessment Marital status: Single Is patient pregnant?: No Pregnancy Status: No Living Arrangements: Parent Can pt return to current living arrangement?: Yes Admission Status: Involuntary Is patient capable of signing voluntary admission?: No Referral Source: Self/Family/Friend Insurance type: [facility] [place]     Crisis Care Plan Living Arrangements: Parent Name of Psychiatrist: UTA Name of Therapist: UTA  Education Status Is patient currently in school?: No  Risk to self with the past 6  months Suicidal Ideation: No-Not Currently/Within Last 6 Months Has patient been a risk to self within the past 6 months prior to admission? : Yes Suicidal Intent: No-Not Currently/Within Last 6 Months Has patient had any suicidal intent within the past 6 months prior to admission? : Yes Is patient at risk for suicide?: Yes Suicidal Plan?: No-Not Currently/Within Last 6 Months Has patient had any suicidal plan within the past 6 months prior to admission? : Yes Access to Means: No What has been your use of drugs/alcohol within the  last 12 months?: Marijuana Previous Attempts/Gestures: No How many times?: 0 Other Self Harm Risks: UTA Triggers for Past Attempts: None known Intentional Self Injurious Behavior: None Family Suicide History: No Recent stressful life event(s): Conflict (Comment) (Arguing with family.) Persecutory voices/beliefs?: Yes Depression: Yes Depression Symptoms: Despondent, Fatigue, Feeling worthless/self pity, Feeling angry/irritable Substance abuse history and/or treatment for substance abuse?: Yes Suicide prevention information given to non-admitted patients: Not applicable  Risk to Others within the past 6 months Homicidal Ideation: No-Not Currently/Within Last 6 Months Does patient have any lifetime risk of violence toward others beyond the six months prior to admission? : Unknown Thoughts of Harm to Others: No-Not Currently Present/Within Last 6 Months Current Homicidal Intent: No Current Homicidal Plan: No Access to Homicidal Means: No Identified Victim: No one History of harm to others?:  (Unknown) Assessment of Violence: None Noted Violent Behavior Description: Unknown Does patient have access to weapons?: No Criminal Charges Pending?: No Does patient have a court date: No Is patient on probation?: No  Psychosis Hallucinations: Auditory (Unable to assess) Delusions: None noted  Mental Status Report Appearance/Hygiene: Unremarkable, In scrubs Eye Contact: Poor Motor Activity: Freedom of movement, Unremarkable Speech: Soft, Slow Level of Consciousness: Drowsy Mood: Depressed, Empty, Sad Affect: Blunted, Depressed Anxiety Level:  (UTA) Thought Processes: Unable to Assess Judgement: Unable to Assess Orientation: Unable to assess Obsessive Compulsive Thoughts/Behaviors: None  Cognitive Functioning Concentration: Poor Memory: Recent Impaired, Remote Impaired IQ: Average Insight: Poor Impulse Control: Poor Appetite: Good Weight Loss: 0 Weight Gain: 0 Sleep: No  Change Total Hours of Sleep:  (<5H/D) Vegetative Symptoms: Unable to Assess  ADLScreening [facility] Assessment Services) Patient's cognitive ability adequate to safely complete daily activities?: Yes Patient able to express need for assistance with ADLs?: Yes Independently performs ADLs?: Yes (appropriate for developmental age)  Prior Inpatient Therapy Prior Inpatient Therapy: Yes Prior Therapy Dates: Unknown Prior Therapy Facilty/Provider(s): Unknown Reason for Treatment: Psychosis  Prior Outpatient Therapy Prior Outpatient Therapy: Yes Prior Therapy Dates: Ongoing Prior Therapy Facilty/Provider(s): [facility] [phone] Reason for Treatment: MH issues Does patient have an ACCT team?: Yes Does patient have Intensive In-House Services?  : No Does patient have [facility] services? : No Does patient have P4CC services?: No  ADL Screening (condition at time of admission) Patient's cognitive ability adequate to safely complete daily activities?: Yes Is the patient deaf or have difficulty hearing?: No Does the patient have difficulty seeing, even when wearing glasses/contacts?: No Does the patient have difficulty concentrating, remembering, or making decisions?: Yes Patient able to express need for assistance with ADLs?: Yes Does the patient have difficulty dressing or bathing?: No Independently performs ADLs?: Yes (appropriate for developmental age) Communication: Independent Dressing (OT): Independent Is this a change from baseline?: Pre-admission baseline Grooming: Independent Is this a change from baseline?: Pre-admission baseline Feeding: Independent Is this a change from baseline?: Pre-admission baseline Bathing: Independent Is this a change from baseline?: Pre-admission baseline Toileting: Independent Is this a change from  baseline?: Pre-admission baseline In/Out Bed: Independent Is this a change from baseline?: Pre-admission baseline Walks in Home:  Independent Is this a change from baseline?: Pre-admission baseline Does the patient have difficulty walking or climbing stairs?: No Weakness of Legs: None Weakness of Arms/Hands: None       Abuse/Neglect Assessment (Assessment to be complete while patient is alone) Physical Abuse: Denies Verbal Abuse: Denies Sexual Abuse: Denies Exploitation of patient/patient's resources: Denies Self-Neglect: Denies     Merchant navy officer (For Healthcare) Does patient have an advance directive?: No Would patient like information on creating an advanced directive?: No - patient declined information    Additional Information 1:1 In Past 12 Months?: No CIRT Risk: No Elopement Risk: No Does patient have medical clearance?: Yes     Disposition:  Disposition Initial Assessment Completed for this Encounter: Yes Disposition of Patient: Inpatient treatment program Type of inpatient treatment program: Adult (Pt to be reviewed by PA)  Beatriz Stallion Ray 08/23/2015 1:50 AM

## 2015-08-23 NOTE — ED Notes (Signed)
Pt belongings moved from locker 33 to locker 31

## 2015-08-24 DIAGNOSIS — F919 Conduct disorder, unspecified: Secondary | ICD-10-CM | POA: Diagnosis not present

## 2015-08-24 NOTE — ED Notes (Signed)
Patient masturbating. Refused to stop.

## 2015-08-24 NOTE — Progress Notes (Signed)
CSW faxed patient to the following facilities in order to try and obtain bed placement:  [facility]  [place], [place] [phone] ED CSW 08/24/2015 10:11 PM

## 2015-08-24 NOTE — Progress Notes (Signed)
Entered in d/c instructions  Devra Dopp This is your assigned Medicaid [place] access doctor If you prefer another contact DSS [phone] DSS assigned your doctor *You may receive a bill if you go to any family Dr not assigned to you [address] [place] [address] [phone] Medicaid [place] Access Covered Patient [place]: [phone] [address] [place], [place] [address] [email or web address] Use this website to assist with understanding your coverage & to renew application As a Medicaid client you MUST contact DSS/SSI each time you change address, move to another [place] county or another state to keep your address updated  [place] Medicaid Transportation to Dr appts if you are have full Medicaid: [phone], [phone]

## 2015-08-24 NOTE — BH Assessment (Signed)
Patient was reassessed by TTS.   Patient states that he has been in several arguments with his brother and states that he does not initiate the arguments. Patient denies SI/HI and AVH. Patient appears to be responding to internal stimuli. Patient states that he has an active ACT Team at this time and last saw them on Wednesday.   Dr. Jannifer Franklin continues to recommend inpatient treatment at this time.   Davina Poke, LCSW Therapeutic Triage Specialist [facility] 08/24/2015 4:41 PM

## 2015-08-24 NOTE — ED Notes (Signed)
Patient cleaning his ears with sharpened end of broken fork. Patient gave up willingly. Given a washcloth to clean his ears.

## 2015-08-24 NOTE — BH Assessment (Signed)
[facility] Assessment Progress Note  The following facilities have been contacted to seek placement for this pt, with results as noted:  Beds available, information sent, decision pending:  [facility] (for med psych unit)   On wait list:  [facility]   At capacity:  [facility], [place] Triage Specialist [phone]

## 2015-08-25 DIAGNOSIS — F919 Conduct disorder, unspecified: Secondary | ICD-10-CM | POA: Diagnosis not present

## 2015-08-25 DIAGNOSIS — F259 Schizoaffective disorder, unspecified: Secondary | ICD-10-CM | POA: Diagnosis not present

## 2015-08-25 NOTE — BH Assessment (Signed)
Dr. Elsie Saas and Julieanne Cotton, NP recommend discharge home. Guardian and ACTT ([facility]) made aware. Patient's father will pick patient up around 1pm.

## 2015-08-25 NOTE — Consult Note (Signed)
Psychiatric Specialty Exam: Physical Exam  ROS  Blood pressure 148/76, pulse 67, temperature 98.3 F (36.8 C), temperature source Oral, resp. rate 13, SpO2 98 %.There is no weight on file to calculate BMI.  General Appearance: Casual  Eye Contact::  Good  Speech:  Clear and Coherent and Normal Rate  Volume:  Normal  Mood:  Euthymic  Affect:  Congruent  Thought Process:  Coherent, Goal Directed and Intact  Orientation:  Full (Time, Place, and Person)  Thought Content:  WDL  Suicidal Thoughts:  No  Homicidal Thoughts:  No  Memory:  Immediate;   Good Recent;   Good Remote;   Good  Judgement:  Good  Insight:  Good  Psychomotor Activity:  Psychomotor Retardation  Concentration:  Good  Recall:  Fair  Fund of Knowledge:  Fair  Language:  Good  Akathisia:  No  Handed:  Right  AIMS (if indicated):     Assets:  Desire for Improvement  ADL's:  Intact  Cognition:  WNL  Sleep:      Patient was seen this morning awake alert and oriented x 3.  Patient denies SI/HI/AVH stating I see the doctors and Nurses.  Patient states that he will avoid his brother who is a trigger for him.  Patient states he is compliant with his medications which his mother gives to him.  Patient has been calm and cooperative since his arrival.  He has not exhibited any aggression or agitation.  His ACT team plans to see him as soon as possible.  Patient is discharged.  Schizoaffective disorder, unspecified type [facility])   Plan: Discharge home  Brandon [name]   PMHNP-BC.  Patient seen for face-to-face psychiatry evaluation, case discussed with the treatment team and formulated treatment plan. Reviewed the information documented and agree with the treatment plan.   Brandon [name],Brandon R. 08/25/2015 2:17 PM

## 2015-08-25 NOTE — ED Notes (Signed)
[facility] representative called to say that their physician (Dr. Lovey Newcomer) has declined admission because of primary behavioral issues.  Will contact [facility].

## 2015-08-25 NOTE — BHH Suicide Risk Assessment (Cosign Needed)
Suicide Risk Assessment  Discharge Assessment   [facility] Discharge Suicide Risk Assessment   Principal Problem: Schizoaffective disorder, unspecified type [facility]) Discharge Diagnoses:  Patient Active Problem List   Diagnosis Date Noted  . Schizoaffective disorder, unspecified type (HCC) [F25.9]     Priority: High  . Schizoaffective disorder (HCC) [F25.9]   . Hallucinations [R44.3] 08/05/2014  . Auditory hallucination [R44.0]   . Homicidal ideation [R45.850]   . Left-sided weakness [M62.89] 01/18/2014  . Multiple sclerosis (HCC) [G35] 05/20/2013  . White matter abnormality on MRI of brain [R93.0] 11/23/2012  . Abnormality of gait [R26.9] 11/23/2012  . HTN (hypertension) [I10] 07/04/2011  . Ataxia [R27.0] 07/02/2011  . OBESITY [E66.9] 01/14/2010  . ADHD [F90.9] 01/14/2010    Total Time spent with patient: 20 minutes  Musculoskeletal: Strength & Muscle Tone: within normal limits Gait & Station: normal Patient leans: N/A  Psychiatric Specialty Exam:   Blood pressure 148/76, pulse 67, temperature 98.3 F (36.8 C), temperature source Oral, resp. rate 13, SpO2 98 %.There is no weight on file to calculate BMI.  General Appearance: Casual  Eye Contact:: Good  Speech: Clear and Coherent and Normal Rate  Volume: Normal  Mood: Euthymic  Affect: Congruent  Thought Process: Coherent, Goal Directed and Intact  Orientation: Full (Time, Place, and Person)  Thought Content: WDL  Suicidal Thoughts: No  Homicidal Thoughts: No  Memory: Immediate; Good Recent; Good Remote; Good  Judgement: Good  Insight: Good  Psychomotor Activity: Psychomotor Retardation  Concentration: Good  Recall: Fair  Fund of Knowledge: Fair  Language: Good  Akathisia: No  Handed: Right  AIMS (if indicated):    Assets: Desire for Improvement  ADL's: Intact  Cognition: WNL         Mental Status Per Nursing Assessment::   On Admission:     Demographic  Factors:  Male, Adolescent or young adult, Low socioeconomic status and Unemployed  Loss Factors: NA  Historical Factors: NA  Risk Reduction Factors:   Living with another person, especially a relative  Continued Clinical Symptoms:  Alcohol/Substance Abuse/Dependencies More than one psychiatric diagnosis Previous Psychiatric Diagnoses and Treatments  Cognitive Features That Contribute To Risk:  Polarized thinking    Suicide Risk:  Minimal: No identifiable suicidal ideation.  Patients presenting with no risk factors but with morbid ruminations; may be classified as minimal risk based on the severity of the depressive symptoms  Follow-up Information    Follow up with Devra Dopp, MD.   Specialty:  Family Medicine   Why:  This is your assigned Medicaid [place] access doctor If you prefer another contact DSS [phone] DSS assigned your doctor *You may receive a bill if you go to any family Dr not assigned to you   Contact information:   [address] [place] [address] [phone]       Follow up with Medicaid [place] Access Covered Patient .   [employer] [address] [phone] [place], [place] [address] [email or web address] Use this website to assist with understanding your coverage & to renew application   Contact information:   As a Medicaid client you MUST contact DSS/SSI each time you change address, move to another [place] county or another state to keep your address updated  [place] Medicaid Transportation to Dr appts if you are have full Medicaid: [phone], [phone]      Plan Of Care/Follow-up recommendations:  Activity:  as tolerated Diet:  Regular  Earney Navy, NP  PMHNP-BC 08/25/2015, 12:16 PM

## 2015-08-25 NOTE — ED Notes (Addendum)
Pt is cooperative./ He denies Si and HI and contracts for safety. Pt took a shower as he did have a body odor. pts father will pick him up at 1pm, today. Pt does appear glad to be going home. He is pleasant and does not appear agitated at all. Pt ate 100 % of his lunch. Pt is waiting for his dad to pick him up. 1:25pm. Pt does laugh inapproprialety as if he responding to some internal stimuli but he denies this. Social  aware the pt has not left yet. (1;30p)Mom phoned to state they are on their way. Pt is dancing and appears excited he is leaving soon.

## 2015-08-25 NOTE — ED Notes (Addendum)
Pt asleep. No distress noted.  Will continue to monitor.

## 2015-09-12 ENCOUNTER — Encounter (HOSPITAL_COMMUNITY)
Admission: RE | Admit: 2015-09-12 | Discharge: 2015-09-12 | Disposition: A | Discharge status: Home / Self Care | Payer: Medicare Other | Source: Ambulatory Visit | Attending: Neurology | Admitting: Neurology

## 2015-09-12 ENCOUNTER — Encounter (HOSPITAL_COMMUNITY): Payer: Self-pay

## 2015-09-12 VITALS — BP 145/95 | HR 85 | Temp 97.9°F | Resp 18 | Ht 73.0 in | Wt 307.5 lb

## 2015-09-12 DIAGNOSIS — G35 Multiple sclerosis: Secondary | ICD-10-CM | POA: Insufficient documentation

## 2015-09-12 MED ORDER — SODIUM CHLORIDE 0.9 % IV SOLN
300.0000 mg | INTRAVENOUS | Status: DC
  Administered 2015-09-12: 300 mg via INTRAVENOUS
  Filled 2015-09-12: qty 15

## 2015-09-12 MED ORDER — SODIUM CHLORIDE 0.9 % IV SOLN
INTRAVENOUS | Status: DC
Start: 2015-09-12 — End: 2015-09-13
  Administered 2015-09-12: 13:00:00 via INTRAVENOUS

## 2015-09-12 MED ORDER — LORATADINE 10 MG PO TABS
10.0000 mg | ORAL_TABLET | ORAL | Status: DC
  Administered 2015-09-12: 10 mg via ORAL
  Filled 2015-09-12: qty 1

## 2015-09-12 MED ORDER — ACETAMINOPHEN 325 MG PO TABS
650.0000 mg | ORAL_TABLET | ORAL | Status: DC
  Administered 2015-09-12: 650 mg via ORAL
  Filled 2015-09-12: qty 2

## 2015-09-12 NOTE — Discharge Instructions (Signed)

## 2015-09-12 NOTE — Progress Notes (Signed)
Pt. Arrived to floor with father, pt. Gave RN name and DOB, pt's father answered rest of questions for infusion. During procedure pt. Talking with himself and smiles occasionally at nurse. Pt. Stayed 15 minutes infusion, pt.'s father instructed to follow-up with doctor with any problems.

## 2015-09-27 ENCOUNTER — Encounter: Payer: Self-pay | Admitting: Adult Health

## 2015-09-27 ENCOUNTER — Telehealth: Payer: Self-pay | Admitting: Adult Health

## 2015-09-27 ENCOUNTER — Ambulatory Visit (INDEPENDENT_AMBULATORY_CARE_PROVIDER_SITE_OTHER): Payer: Medicare Other | Admitting: Adult Health

## 2015-09-27 VITALS — BP 140/76 | HR 75 | Ht 73.0 in | Wt 300.0 lb

## 2015-09-27 DIAGNOSIS — R269 Unspecified abnormalities of gait and mobility: Secondary | ICD-10-CM | POA: Diagnosis not present

## 2015-09-27 DIAGNOSIS — G35 Multiple sclerosis: Secondary | ICD-10-CM | POA: Diagnosis not present

## 2015-09-27 DIAGNOSIS — Z5181 Encounter for therapeutic drug level monitoring: Secondary | ICD-10-CM

## 2015-09-27 NOTE — Patient Instructions (Signed)
Continue Tysabri If your symptoms worsen or you develop new symptoms please let us know.

## 2015-09-27 NOTE — Progress Notes (Signed)
I have read the note, and I agree with the clinical assessment and plan.  WILLIS,CHARLES KEITH   

## 2015-09-27 NOTE — Telephone Encounter (Signed)
I called the patient and spoke to his mother. He needs to have a JCV antibody checked. He can come in and have  lab work at his convenience within the next 1-2 weeks. Mother voiced understanding. She states she will bring him in next week.

## 2015-09-27 NOTE — Progress Notes (Signed)
PATIENT: Brandon [name] DOB: Jan 23, 1991  REASON FOR VISIT: follow up- multiple sclerosis HISTORY FROM: patient  HISTORY OF PRESENT ILLNESS: Brandon [name] is a 25 year old male with a history of multiple sclerosis and schizoaffective disorder. He returns today for follow-up. The patient was recently in the hospital for behavior issues related to schizoaffective disorder. His medication was adjusted. He was taken off of Haldol and started on Zyprexa. His mom states that the drooling has since resolved and his balance has improved. She has noticed that occasionally he will limp on that left leg. He continues to have issues with urinary urgency. He will wet the bed at night. The patient has to take stool softeners for constipation. He denies any changes in his vision. He recently saw Dr. Dione Booze and reported that his exam was normal. Mom states that he has gained some weight. Patient denies any new numbness or weakness. He returns today for an evaluation.  HISTORY 05/28/2015 Brandon [name]): Brandon [name] is a 25 year old right-handed black male with a history of multiple sclerosis and schizoaffective disorder. The patient is on Haldol, and recently was placed on Trileptal. The patient apparently had some change in his ability to walk last week. The patient himself comes to the office by himself, he denies any significant change in walking, but he is a poor historian. He denies any new numbness or weakness of extremities, he does report some numbness in the legs at times. He denies any recent falls. He is not having any new changes with vision, bowel bladder control problems, or confusion. The patient is on Tysabri, he is tolerating the medication well.  REVIEW OF SYSTEMS: Out of a complete 14 system review of symptoms, the patient complains only of the following symptoms, and all other reviewed systems are negative.  Fatigue, hearing loss, ear pain, leg swelling, constipation, rectal pain, excessive eating,  restless leg, insomnia, frequent waking, snoring, sleep talking, neck stiffness, walking difficulty, back pain, joint pain, urgency, frequency of urination, weakness, agitation, behavior problem, depression, hallucinations  ALLERGIES: No Known Allergies  HOME MEDICATIONS: Outpatient Prescriptions Prior to Visit  Medication Sig Dispense Refill  . amLODipine (NORVASC) 10 MG tablet Take 10 mg by mouth daily.    . benztropine (COGENTIN) 1 MG tablet Take 1 tablet (1 mg total) by mouth 2 (two) times daily. 60 tablet 0  . docusate sodium (COLACE) 100 MG capsule Take 100 mg by mouth 2 (two) times daily.     Hinda Glatter SUSTENNA 234 MG/1.5ML SUSP injection Inject 234 mg as directed every 21 ( twenty-one) days.    . Melatonin 5 MG TABS Take 10 mg by mouth at bedtime.     Marland Kitchen OLANZapine zydis (ZYPREXA) 10 MG disintegrating tablet Take 10 mg by mouth at bedtime.    . propranolol (INDERAL) 10 MG tablet Take 1 tablet (10 mg total) by mouth 2 (two) times daily. 60 tablet 0  . psyllium (METAMUCIL) 58.6 % packet Take 1 packet by mouth daily.    . traZODone (DESYREL) 100 MG tablet Take 200 mg by mouth at bedtime.     . Vitamin D, Ergocalciferol, (DRISDOL) 50000 UNITS CAPS capsule Take 50,000 Units by mouth every 7 (seven) days.     Facility-Administered Medications Prior to Visit  Medication Dose Route Frequency Provider Last Rate Last Dose  . natalizumab (TYSABRI) 300 mg in sodium chloride 0.9 % 100 mL IVPB  300 mg Intravenous Q28 days York Spaniel, MD        PAST  MEDICAL HISTORY: Past Medical History  Diagnosis Date  . ADHD (attention deficit hyperactivity disorder)   . Hypertension   . Schizophrenia (HCC)   . Bipolar 1 disorder (HCC)   . 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 Obesity   . Multiple sclerosis (HCC) 05/20/2013    left sided weakness, dysarthria  . Chronic back pain   . Non-compliance   . Chronic neck pain     PAST SURGICAL HISTORY: Past Surgical History    Procedure Laterality Date  . None      FAMILY HISTORY: Family History  Problem Relation Age of Onset  . Diabetes Mother   . ADD / ADHD Brother     SOCIAL HISTORY: Social History   Social History  . Marital Status: Single    Spouse Name: N/A  . Number of Children: 0  . Years of Education: 11th   Occupational History  .      Disbaled   Social History Main Topics  . Smoking status: Current Every Day Smoker -- 0.25 packs/day    Types: Cigarettes  . Smokeless tobacco: Never Used  . Alcohol Use: 0.0 oz/week    0 Standard drinks or equivalent per week     Comment: "A little bit"   . Drug Use: Yes    Special: Marijuana     Comment: Last used: unknown   . Sexual Activity: Not on file   Other Topics Concern  . Not on file   Social History Narrative   Patient lives at home with his mother.   Disabled.   Education 11 th grade .   Right handed.   Caffeine - one cup daily soda.      PHYSICAL EXAM  Filed Vitals:   09/27/15 1126  BP: 140/76  Pulse: 75  Height:  (1.854 m)  Weight: 300 lb (136.079 kg)   Body mass index is 39.59 kg/(m^2).  Generalized: Well developed, in no acute distress   Neurological examination  Mentation: Alert oriented to time, place, history taking. Follows all commands speech and language fluent. Flat affect Cranial nerve II-XII: Pupils were equal round reactive to light. Extraocular movements were full, visual field were full on confrontational test. Facial sensation and strength were normal. Uvula tongue midline. Head turning and shoulder shrug  were normal and symmetric. Motor: The motor testing reveals 5 over 5 strength of all 4 extremities. Good symmetric motor tone is noted throughout.  Sensory: Sensory testing is intact to soft touch on all 4 extremities. No evidence of extinction is noted.  Coordination: Cerebellar testing reveals good finger-nose-finger and heel-to-shin bilaterally.  Gait and station: The patient has a  circumduction type gait on the left leg. Tandem gait is slightly unsteady. Romberg is negative. Reflexes: Deep tendon reflexes are symmetric and normal bilaterally.   DIAGNOSTIC DATA (LABS, IMAGING, TESTING) - I reviewed patient records, labs, notes, testing and imaging myself where available.  Lab Results  Component Value Date   WBC 7.5 08/22/2015   HGB 12.7* 08/22/2015   HCT 39.4 08/22/2015   MCV 80.7 08/22/2015   PLT 148* 08/22/2015      Component Value Date/Time   NA 140 08/22/2015 2235   NA 142 05/28/2015 1331   K 3.8 08/22/2015 2235   CL 104 08/22/2015 2235   CO2 28 08/22/2015 2235   GLUCOSE 95 08/22/2015 2235   GLUCOSE 77 05/28/2015 1331   BUN 18 08/22/2015 2235   BUN 14 05/28/2015 1331   CREATININE  1.00 08/22/2015 2235   CALCIUM 9.2 08/22/2015 2235   PROT 7.0 08/22/2015 2235   PROT 6.7 05/28/2015 1331   ALBUMIN 4.4 08/22/2015 2235   ALBUMIN 4.6 05/28/2015 1331   AST 18 08/22/2015 2235   ALT 34 08/22/2015 2235   ALKPHOS 90 08/22/2015 2235   BILITOT 0.5 08/22/2015 2235   BILITOT 0.5 05/28/2015 1331   GFRNONAA >60 08/22/2015 2235   GFRAA >60 08/22/2015 2235   Lab Results  Component Value Date   CHOL 113 01/29/2011   HDL 33* 01/29/2011   LDLCALC 70 01/29/2011   TRIG 52 01/29/2011   CHOLHDL 3.4 01/29/2011       ASSESSMENT AND PLAN 25 y.o. year old male  has a past medical history of ADHD (attention deficit hyperactivity disorder); Hypertension; Schizophrenia (HCC); Bipolar 1 disorder (HCC); White matter abnormality on MRI of brain (11/23/2012); Obesity; Multiple sclerosis (HCC) (05/20/2013); Chronic back pain; Non-compliance; and Chronic neck pain. here with:  1. Multiple sclerosis 2. Abnormality of gait 3. Schizoaffective disorder  Overall the patient has remained stable. The patient had recent blood work in the hospital-it was unremarkable. We do need to check a JCV. He will continue on tysabri. If his symptoms worsen or he develops any new symptoms he  should let us know. He will follow-up in 3 months or sooner if needed.  Butch Penny, MSN, NP-C 09/27/2015, 11:42 AM [facility] [address], [address] [place], [place] [address] [phone]

## 2015-10-10 ENCOUNTER — Encounter (HOSPITAL_COMMUNITY)
Admission: RE | Admit: 2015-10-10 | Discharge: 2015-10-10 | Disposition: A | Discharge status: Home / Self Care | Payer: Medicare Other | Source: Ambulatory Visit | Attending: Neurology | Admitting: Neurology

## 2015-10-10 ENCOUNTER — Encounter (HOSPITAL_COMMUNITY): Payer: Self-pay

## 2015-10-10 VITALS — BP 144/99 | HR 102 | Temp 98.4°F | Resp 20 | Ht 73.0 in | Wt 312.2 lb

## 2015-10-10 DIAGNOSIS — G35 Multiple sclerosis: Secondary | ICD-10-CM | POA: Insufficient documentation

## 2015-10-10 MED ORDER — SODIUM CHLORIDE 0.9 % IV SOLN
INTRAVENOUS | Status: DC
Start: 2015-10-10 — End: 2015-10-19
  Administered 2015-10-10: 13:00:00 via INTRAVENOUS

## 2015-10-10 MED ORDER — ACETAMINOPHEN 325 MG PO TABS
650.0000 mg | ORAL_TABLET | ORAL | Status: AC
  Administered 2015-10-10: 650 mg via ORAL
  Filled 2015-10-10: qty 2

## 2015-10-10 MED ORDER — LORATADINE 10 MG PO TABS
10.0000 mg | ORAL_TABLET | ORAL | Status: AC
  Administered 2015-10-10: 10 mg via ORAL
  Filled 2015-10-10: qty 1

## 2015-10-10 MED ORDER — SODIUM CHLORIDE 0.9 % IV SOLN
300.0000 mg | INTRAVENOUS | Status: DC
  Administered 2015-10-10: 300 mg via INTRAVENOUS
  Filled 2015-10-10: qty 15

## 2015-10-10 NOTE — Progress Notes (Signed)
Post- infusion Tysabri, Pt. Talking to himself during infusion, laughing. Pt.'s  Father did not stay in the room, pt's father instructed that he must stay in the room with pt., pt's father just laughed and stated" he is ok".

## 2015-10-10 NOTE — Discharge Instructions (Signed)

## 2015-10-12 ENCOUNTER — Other Ambulatory Visit (INDEPENDENT_AMBULATORY_CARE_PROVIDER_SITE_OTHER): Payer: Self-pay

## 2015-10-12 DIAGNOSIS — Z0289 Encounter for other administrative examinations: Secondary | ICD-10-CM

## 2015-10-12 DIAGNOSIS — Z5181 Encounter for therapeutic drug level monitoring: Secondary | ICD-10-CM

## 2015-10-17 ENCOUNTER — Telehealth: Payer: Self-pay | Admitting: *Deleted

## 2015-10-17 NOTE — Telephone Encounter (Signed)
Received JCV test results fron [facility].  10-13-15  Index value 0.06 and Negative JCV antibody.  Copy to initial.

## 2015-10-17 NOTE — Telephone Encounter (Signed)
Spoke to pt and relayed the JCV results negative.  Verbalized understanding.

## 2015-10-17 NOTE — Telephone Encounter (Signed)
JCV is negative. Please call patient.

## 2015-10-21 ENCOUNTER — Other Ambulatory Visit: Payer: Self-pay

## 2015-10-21 ENCOUNTER — Emergency Department (HOSPITAL_COMMUNITY)
Admission: EM | Admit: 2015-10-21 | Discharge: 2015-10-21 | Disposition: A | Discharge status: Home / Self Care | Payer: Medicare Other | Attending: Emergency Medicine | Admitting: Emergency Medicine

## 2015-10-21 ENCOUNTER — Encounter (HOSPITAL_COMMUNITY): Payer: Self-pay | Admitting: Emergency Medicine

## 2015-10-21 DIAGNOSIS — Z79899 Other long term (current) drug therapy: Secondary | ICD-10-CM | POA: Diagnosis not present

## 2015-10-21 DIAGNOSIS — F319 Bipolar disorder, unspecified: Secondary | ICD-10-CM | POA: Insufficient documentation

## 2015-10-21 DIAGNOSIS — R0789 Other chest pain: Secondary | ICD-10-CM | POA: Diagnosis not present

## 2015-10-21 DIAGNOSIS — R079 Chest pain, unspecified: Secondary | ICD-10-CM | POA: Diagnosis present

## 2015-10-21 DIAGNOSIS — I10 Essential (primary) hypertension: Secondary | ICD-10-CM | POA: Insufficient documentation

## 2015-10-21 DIAGNOSIS — G8929 Other chronic pain: Secondary | ICD-10-CM | POA: Diagnosis not present

## 2015-10-21 DIAGNOSIS — Z9119 Patient's noncompliance with other medical treatment and regimen: Secondary | ICD-10-CM | POA: Insufficient documentation

## 2015-10-21 DIAGNOSIS — F17210 Nicotine dependence, cigarettes, uncomplicated: Secondary | ICD-10-CM | POA: Insufficient documentation

## 2015-10-21 NOTE — ED Provider Notes (Signed)
CSN: [id number]     Arrival date & time 10/21/15  1143 History   First MD Initiated Contact with Patient 10/21/15 1215     Chief Complaint  Patient presents with  . Chest Pain  . Back Pain     (Consider location/radiation/quality/duration/timing/severity/associated sxs/prior Treatment) HPI   25 year old male schizoaffective disorder presents today stating that he has had chest pain for the past 2 months. He states it has been constant in nature. It does worsen with certain movements. He denies exertional pain, dyspnea, fever, cough. Pain has continued today and he decided to come to the ED for this. He does state that he has taken ibuprofen and Tylenol with some intermittent relief.  Past Medical History  Diagnosis Date  . ADHD (attention deficit hyperactivity disorder)   . Hypertension   . Schizophrenia (HCC)   . Bipolar 1 disorder (HCC)   . 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 Obesity   . Multiple sclerosis (HCC) 05/20/2013    left sided weakness, dysarthria  . Chronic back pain   . Non-compliance   . Chronic neck pain    Past Surgical History  Procedure Laterality Date  . None     Family History  Problem Relation Age of Onset  . Diabetes Mother   . ADD / ADHD Brother    Social History  Substance Use Topics  . Smoking status: Current Every Day Smoker -- 0.25 packs/day    Types: Cigarettes  . Smokeless tobacco: Never Used  . Alcohol Use: 0.0 oz/week    0 Standard drinks or equivalent per week     Comment: "A little bit"     Review of Systems  All other systems reviewed and are negative.     Allergies  Review of patient's allergies indicates no known allergies.  Home Medications   Prior to Admission medications   Medication Sig Start Date End Date Taking? Authorizing Provider  amLODipine (NORVASC) 10 MG tablet Take 10 mg by mouth daily.    Historical Provider, MD  benztropine (COGENTIN) 1 MG tablet Take 1 tablet (1 mg total) by mouth 2  (two) times daily. 04/02/15   Charm Rings, NP  cloZAPine (CLOZARIL) 100 MG tablet Take 100 mg by mouth 2 (two) times daily.    Historical Provider, MD  docusate sodium (COLACE) 100 MG capsule Take 100 mg by mouth 2 (two) times daily.     Historical Provider, MD  INVEGA SUSTENNA 234 MG/1.5ML SUSP injection Inject 234 mg as directed every 21 ( twenty-one) days. 05/16/15   Historical Provider, MD  Melatonin 5 MG TABS Take 10 mg by mouth at bedtime.     Historical Provider, MD  OLANZapine zydis (ZYPREXA) 10 MG disintegrating tablet Take 10 mg by mouth at bedtime.    Historical Provider, MD  propranolol (INDERAL) 10 MG tablet Take 1 tablet (10 mg total) by mouth 2 (two) times daily. 05/03/15   Charm Rings, NP  psyllium (METAMUCIL) 58.6 % packet Take 1 packet by mouth daily.    Historical Provider, MD  traZODone (DESYREL) 100 MG tablet Take 200 mg by mouth at bedtime.  05/14/15   Historical Provider, MD  Vitamin D, Ergocalciferol, (DRISDOL) 50000 UNITS CAPS capsule Take 50,000 Units by mouth every 7 (seven) days.    Historical Provider, MD   BP 135/87 mmHg  Pulse 93  Temp(Src) 98.1 F (36.7 C) (Oral)  Resp 18  Ht 6' (1.829 m)  Wt 136.079 kg  BMI  40.68 kg/m2  SpO2 100% Physical Exam  Constitutional: He is oriented to person, place, and time. He appears well-developed. No distress.  Morbidly obese  HENT:  Head: Normocephalic and atraumatic.  Right Ear: External ear normal.  Left Ear: External ear normal.  Nose: Nose normal.  Mouth/Throat: Oropharynx is clear and moist.  Eyes: Pupils are equal, round, and reactive to light.  Neck: Normal range of motion. Neck supple.  Cardiovascular: Normal rate and regular rhythm.   Pulmonary/Chest: Effort normal and breath sounds normal. He exhibits tenderness.    Abdominal: Soft. Bowel sounds are normal.  Musculoskeletal: Normal range of motion.  Neurological: He is alert and oriented to person, place, and time.  Skin: Skin is warm and dry.    Psychiatric: He has a normal mood and affect. His behavior is normal.  Nursing note and vitals reviewed.   ED Course  Procedures (including critical care time) Labs Review Labs Reviewed - No data to display  Imaging Review No results found. I have personally reviewed and evaluated these images and lab results as part of my medical decision-making.   EKG Interpretation   Date/Time:  Sunday October 21 2015 11:52:31 EDT Ventricular Rate:  94 PR Interval:  134 QRS Duration: 88 QT Interval:  342 QTC Calculation: 427 R Axis:   6 Text Interpretation:  Normal sinus rhythm Normal ECG Confirmed by Cici Rodriges MD,  Duwayne Heck [phone]) on 10/21/2015 12:25:59 PM      MDM   Final diagnoses:  Chest wall pain    25 year old male presents today with ongoing chest pain consistent with chest wall pain on exam. EKG is normal. Have a low index of suspicion for cardiac or pulmonary source of pain. Patient advised regarding return precautions and voices understanding. He is also counseled regarding smoking cessation.    Margarita Grizzle, MD 10/23/15 [phone]

## 2015-10-21 NOTE — Discharge Instructions (Signed)

## 2015-10-21 NOTE — ED Notes (Signed)
Pt c/o constant chest and back pain x 2 months. Pt states the pain is in the center of his chest and is tender to palpation. Pt also states the pain is both upper and lower back. Pt reports it could be caused by the way he sleeps.

## 2015-11-01 ENCOUNTER — Telehealth: Payer: Self-pay | Admitting: Neurology

## 2015-11-01 NOTE — Telephone Encounter (Signed)
Physician order form completed, awaiting signature.

## 2015-11-01 NOTE — Telephone Encounter (Signed)
[facility] [phone] called back, order received without being dated or timed.

## 2015-11-01 NOTE — Telephone Encounter (Signed)
[facility] [phone] called to request orders for Tysabri. Patient is scheduled for Wednesday May 3rd.

## 2015-11-01 NOTE — Telephone Encounter (Signed)
Order form dated, signed and re-faxed.

## 2015-11-01 NOTE — Telephone Encounter (Signed)
Orders faxed to [facility] [phone]

## 2015-11-07 ENCOUNTER — Other Ambulatory Visit (HOSPITAL_COMMUNITY): Payer: Self-pay | Admitting: Neurology

## 2015-11-07 ENCOUNTER — Encounter (HOSPITAL_COMMUNITY): Admission: RE | Admit: 2015-11-07 | Payer: Medicare Other | Source: Ambulatory Visit

## 2015-11-12 ENCOUNTER — Telehealth: Payer: Self-pay | Admitting: *Deleted

## 2015-11-12 NOTE — Telephone Encounter (Addendum)
Returned call and spoke to pt's mother. She reports that pt is having an exacerbation w/ decreased mobility on the left side. Pt was originally scheduled for Tysabri infusion at [facility] on 11/07/15. However, documentation reports that pt no-showed his appt. Mother said that he didn't no-show his appt but that it was moved to the end of the month and he needs sooner infusion. Called [facility] in attempt to schedule earlier appt and scheduler [facility]) is gone for the day. Will try to reach her again in the morning. Notified pt's mother via TC. Understanding and appreciation voiced.

## 2015-11-12 NOTE — Telephone Encounter (Signed)
Pt appt push out to May the 31th need this appt asap the first week of this month. Please call to advised [phone]

## 2015-11-13 ENCOUNTER — Telehealth: Payer: Self-pay | Admitting: Neurology

## 2015-11-13 NOTE — Telephone Encounter (Signed)
Called and spoke w/ [place] @ [facility] re: Tysabri infusion. Says that schedule is completely full through the end of this month. Pt/mother may call to check on cancellations. Will also need to check on transportation ahead of time, if needed.  Spoke to pt's mother who normally transports pt to infusion site. She agreed to call over the next couple of weeks to check on cancellations for sooner appt. Also would like to look into having infusions done @ [facility]. Reports that [facility] site was not covered by pt's insurance in the past. Will research and call her back.

## 2015-11-13 NOTE — Telephone Encounter (Signed)
Brandon [name]/[facility] ACT Team [phone] called, patient was started on CLOZARIL approximately (1) month ago, requiring weekly labs, she will send copy of MAR, there is a delay in treatment with TYSABRI, wanted to make our office aware of MS exacerbation symptoms.

## 2015-11-13 NOTE — Telephone Encounter (Signed)
Talked to [employer] in [place]. She tried to run infusion through [employer] again as he has Medicaid and Medicare. Medicare will cover 80%. Recommended that she apply for grant through [employer] to help w/ co-pays and non-covered infusion costs. This was initiated by [place]. Pt/ mom encouraged to speak w/ [employer] rep when they call. Verbalized understanding and appreciation for call.

## 2015-11-13 NOTE — Telephone Encounter (Signed)
Should initiate Tysabri as soon as possible, a significant delay in treatment may put this patient at high risk for recurrence of MS symptoms.

## 2015-11-15 ENCOUNTER — Encounter: Payer: Self-pay | Admitting: *Deleted

## 2015-11-20 ENCOUNTER — Telehealth: Payer: Self-pay | Admitting: Adult Health

## 2015-11-20 NOTE — Telephone Encounter (Signed)
I received the patient's lab work. 3 BC 7.1, RBC 5.17, hemoglobin 13.2, hematocrit 41.7\  Glucose 101, BUN 16, creatinine 1.01 sodium 141, potassium 4.5

## 2015-12-05 ENCOUNTER — Telehealth: Payer: Self-pay | Admitting: Neurology

## 2015-12-05 ENCOUNTER — Encounter (HOSPITAL_COMMUNITY)
Admission: RE | Admit: 2015-12-05 | Discharge: 2015-12-05 | Disposition: A | Discharge status: Home / Self Care | Payer: Medicare Other | Source: Ambulatory Visit | Attending: Neurology | Admitting: Neurology

## 2015-12-05 DIAGNOSIS — G35 Multiple sclerosis: Secondary | ICD-10-CM | POA: Insufficient documentation

## 2015-12-05 MED ORDER — SODIUM CHLORIDE 0.9 % IV SOLN
INTRAVENOUS | Status: DC
  Administered 2015-12-05: 10:00:00 via INTRAVENOUS

## 2015-12-05 MED ORDER — LORATADINE 10 MG PO TABS
10.0000 mg | ORAL_TABLET | ORAL | Status: DC
  Administered 2015-12-05: 10 mg via ORAL
  Filled 2015-12-05: qty 1

## 2015-12-05 MED ORDER — ACETAMINOPHEN 500 MG PO TABS
1000.0000 mg | ORAL_TABLET | ORAL | Status: DC
  Administered 2015-12-05: 1000 mg via ORAL
  Filled 2015-12-05: qty 2

## 2015-12-05 MED ORDER — SODIUM CHLORIDE 0.9 % IV SOLN
300.0000 mg | INTRAVENOUS | Status: DC
  Administered 2015-12-05: 300 mg via INTRAVENOUS
  Filled 2015-12-05: qty 15

## 2015-12-05 NOTE — Telephone Encounter (Signed)
I got a call from [facility] outpatient. The patient is therefore Brandon [name]. He has complained of chest pain, it appears that he had very visit to the emergency room in mid April 2017 with a two-month history of chest pain, evaluation was unremarkable, it was not felt to be cardiac in nature. Okay to get Brandon [name].  The patient has some ongoing issues with the left side weakness, dragging the left leg. The patient is due for a repeat MRI the brain the summer. He will be seen in September.

## 2015-12-05 NOTE — Telephone Encounter (Signed)
Message For: O/C                  Taken 31-MAY-17 at  9:02AM by DPT ------------------------------------------------------------  Caller  [facility]    CID  [id number]   Patient  Brandon [name]        Pt's Dr  Anne Hahn        Area Code  336  Phone#  [phone] *  DOB  [birth year]      RE  PT HAVING CHEST DISCOMFORT/REQ A C/B FROM THE     DOCTOR                                                Disp:Y/N  Y  If Y = C/B If No Response In  ============================================================  Physician was skyped to pick up call. He accepted and is speaking with the nurse.

## 2015-12-05 NOTE — Telephone Encounter (Signed)
See telephone note by Dr. Anne Hahn.

## 2015-12-05 NOTE — Progress Notes (Signed)
Pt presented preinfusion with chest discomfort 2/10.  Discomfort resolved after Pt in room for 10 min and waiting to eat and be infused pending consult with MD. Dr Anne Hahn notified of:   chest discomfort, left leg and arm weakness, and father's unwillingness to remain in room with patient while he is being treated. Spoke with his father and asked him to stay, then he left room / floor several times for more than 15 minutes each time. Sat with Pt during second half of infusion to insure that he left IV intact since parents were not present. Pt tolerating infusion.

## 2015-12-09 ENCOUNTER — Encounter (HOSPITAL_COMMUNITY): Payer: Self-pay

## 2015-12-09 ENCOUNTER — Emergency Department (HOSPITAL_COMMUNITY): Payer: Medicare Other

## 2015-12-09 ENCOUNTER — Emergency Department (HOSPITAL_COMMUNITY)
Admission: EM | Admit: 2015-12-09 | Discharge: 2015-12-09 | Disposition: A | Discharge status: Home / Self Care | Payer: Medicare Other | Attending: Emergency Medicine | Admitting: Emergency Medicine

## 2015-12-09 DIAGNOSIS — I10 Essential (primary) hypertension: Secondary | ICD-10-CM | POA: Insufficient documentation

## 2015-12-09 DIAGNOSIS — Z79899 Other long term (current) drug therapy: Secondary | ICD-10-CM | POA: Insufficient documentation

## 2015-12-09 DIAGNOSIS — R002 Palpitations: Secondary | ICD-10-CM | POA: Insufficient documentation

## 2015-12-09 DIAGNOSIS — F17210 Nicotine dependence, cigarettes, uncomplicated: Secondary | ICD-10-CM | POA: Insufficient documentation

## 2015-12-09 LAB — BASIC METABOLIC PANEL
Anion gap: 5 (ref 5–15)
BUN: 11 mg/dL (ref 6–20)
CO2: 27 mmol/L (ref 22–32)
Calcium: 9.6 mg/dL (ref 8.9–10.3)
Chloride: 107 mmol/L (ref 101–111)
Creatinine, Ser: 1.12 mg/dL (ref 0.61–1.24)
GFR calc Af Amer: >60 mL/min (ref >60)
GFR calc non Af Amer: >60 mL/min (ref >60)
Glucose, Bld: 90 mg/dL (ref 65–99)
Potassium: 4.2 mmol/L (ref 3.5–5.1)
Sodium: 139 mmol/L (ref 135–145)

## 2015-12-09 LAB — CBC
HCT: 43.6 % (ref 39.0–52.0)
Hemoglobin: 13.4 g/dL (ref 13.0–17.0)
MCH: 25 pg — ABNORMAL LOW (ref 26.0–34.0)
MCHC: 30.7 g/dL (ref 30.0–36.0)
MCV: 81.3 fL (ref 78.0–100.0)
Platelets: 171 10*3/uL (ref 150–400)
RBC: 5.36 MIL/uL (ref 4.22–5.81)
RDW: 14 % (ref 11.5–15.5)
WBC: 9 10*3/uL (ref 4.0–10.5)

## 2015-12-09 LAB — I-STAT TROPONIN, ED: Troponin i, poc: 0 ng/mL (ref 0.00–0.08)

## 2015-12-09 NOTE — ED Notes (Signed)
Pt returns from xray and states that he feels hius heart fluttering. Monitor indicates rate of 72 with NSR. Pt states chest pain is 10/10. Resp even and non labored with NAD.

## 2015-12-09 NOTE — ED Provider Notes (Signed)
CSN: [id number]     Arrival date & time 12/09/15  1043 History   First MD Initiated Contact with Patient 12/09/15 1104     Chief Complaint  Patient presents with  . Palpitations     (Consider location/radiation/quality/duration/timing/severity/associated sxs/prior Treatment) HPI Brandon [name] is a 25 y.o. male with a history of MS, seborrheic, bipolar 1, here for evaluation of palpitations. Patient reports he has felt his heart "fluttering" over the past one month. He also reports associated Sharp central chest discomfort that is also been constant over the past one month. He denies any fevers, chills, shortness of breath, cough, leg swelling. Nothing seems to make the problem better or worse. The discomfort is not exertional. He has not followed up with his PCP for the problem. Denies cigarette smoking. No other modifying factors or medical complaints  Past Medical History  Diagnosis Date  . ADHD (attention deficit hyperactivity disorder)   . Hypertension   . Schizophrenia (HCC)   . Bipolar 1 disorder (HCC)   . 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 Obesity   . Multiple sclerosis (HCC) 05/20/2013    left sided weakness, dysarthria  . Chronic back pain   . Non-compliance   . Chronic neck pain    Past Surgical History  Procedure Laterality Date  . None     Family History  Problem Relation Age of Onset  . Diabetes Mother   . ADD / ADHD Brother    Social History  Substance Use Topics  . Smoking status: Current Every Day Smoker -- 0.25 packs/day    Types: Cigarettes  . Smokeless tobacco: Never Used  . Alcohol Use: 0.0 oz/week    0 Standard drinks or equivalent per week     Comment: "A little bit"     Review of Systems A 10 point review of systems was completed and was negative except for pertinent positives and negatives as mentioned in the history of present illness     Allergies  Review of patient's allergies indicates no known allergies.  Home  Medications   Prior to Admission medications   Medication Sig Start Date End Date Taking? Authorizing Provider  amLODipine (NORVASC) 10 MG tablet Take 10 mg by mouth daily.   Yes Historical Provider, MD  benztropine (COGENTIN) 1 MG tablet Take 1 tablet (1 mg total) by mouth 2 (two) times daily. 04/02/15  Yes Charm Rings, NP  cloZAPine (CLOZARIL) 100 MG tablet Take 100 mg by mouth. Taking 1 tablet in AM and 1 1/2 tablet po QHS.   Yes Historical Provider, MD  docusate sodium (COLACE) 100 MG capsule Take 100 mg by mouth 2 (two) times daily.    Yes Historical Provider, MD  INVEGA SUSTENNA 234 MG/1.5ML SUSP injection Inject 234 mg as directed every 21 ( twenty-one) days. 05/16/15  Yes Historical Provider, MD  Melatonin 5 MG TABS Take 10 mg by mouth at bedtime.    Yes Historical Provider, MD  OLANZapine zydis (ZYPREXA) 10 MG disintegrating tablet Take 10 mg by mouth at bedtime.   Yes Historical Provider, MD  propranolol (INDERAL) 10 MG tablet Take 1 tablet (10 mg total) by mouth 2 (two) times daily. 05/03/15  Yes Charm Rings, NP  traZODone (DESYREL) 100 MG tablet Take 200 mg by mouth at bedtime.  05/14/15  Yes Historical Provider, MD   BP 157/107 mmHg  Pulse 72  Temp(Src) 98.7 F (37.1 C) (Oral)  Resp 18  Ht  (1.854  m)  Wt 136.079 kg  BMI 39.59 kg/m2  SpO2 100% Physical Exam  Constitutional: He is oriented to person, place, and time. He appears well-developed and well-nourished.  Obese African-American male  HENT:  Head: Normocephalic and atraumatic.  Mouth/Throat: Oropharynx is clear and moist.  Eyes: Conjunctivae are normal. Pupils are equal, round, and reactive to light. Right eye exhibits no discharge. Left eye exhibits no discharge. No scleral icterus.  Neck: Neck supple.  Cardiovascular: Normal rate, regular rhythm and normal heart sounds.   Pulmonary/Chest: Effort normal and breath sounds normal. No respiratory distress. He has no wheezes. He has no rales.  Abdominal: Soft.  There is no tenderness.  Musculoskeletal: He exhibits no tenderness.  Neurological: He is alert and oriented to person, place, and time.  Cranial Nerves II-XII grossly intact  Skin: Skin is warm and dry. No rash noted.  Psychiatric: He has a normal mood and affect.  Nursing note and vitals reviewed.   ED Course  Procedures (including critical care time) Labs Review Labs Reviewed  CBC - Abnormal; Notable for the following:    MCH 25.0 (*)    All other components within normal limits  BASIC METABOLIC PANEL  I-STAT TROPOININ, ED    Imaging Review Dg Chest 2 View  12/09/2015  CLINICAL DATA:  One month of heart fluttering, bilateral pain across the chest for 2 days. EXAM: CHEST  2 VIEW COMPARISON:  07/03/2011 FINDINGS: Heart is borderline in size. Lungs are clear. No effusions. No acute bony abnormality. IMPRESSION: Borderline heart size.  No active disease. Electronically Signed   By: Charlett Nose M.D.   On: 12/09/2015 11:58   I have personally reviewed and evaluated these images and lab results as part of my medical decision-making.   EKG Interpretation None     ED ECG REPORT   Date: 12/09/2015  Rate: 95  Rhythm: normal sinus rhythm  QRS Axis: normal  Intervals: normal  ST/T Wave abnormalities: normal  Conduction Disutrbances:none  Narrative Interpretation:   Old EKG Reviewed: unchanged  I have personally reviewed the EKG tracing and agree with the computerized printout as noted.  Filed Vitals:   12/09/15 1209 12/09/15 1215 12/09/15 1230 12/09/15 1300  BP: 123/72 132/89 123/95 157/107  Pulse: 79 82 77 72  Temp:      TempSrc:      Resp: 16  18 18   Height:      Weight:      SpO2: 100% 98% 100% 100%      MDM  Patient presents with palpitations and nonspecific chest pain ongoing and constant over the past one month. On arrival, he appears very well, nontoxic, hemodynamically stable and afebrile. Symptoms not concerning for emergent cardiac or pulmonary etiology.  Heart score 1, PERC negative. Screening labs are unremarkable, EKG is reassuring, troponin negative, chest x-ray shows no acute cardiopulmonary pathology. Discussed follow-up with PCP next week for reevaluation. Discussed strict return precautions. Verbalizes understanding and agrees to this plan as well as subsequent discharge. Overall, he appears well, nontoxic and appropriate for outpatient follow-up. Final diagnoses:  Palpitations       Joycie Peek, PA-C 12/09/15 1327  Arby Barrette, MD 12/23/15 2332

## 2015-12-09 NOTE — ED Notes (Signed)
Pt given meal and tolerates well.

## 2015-12-09 NOTE — ED Notes (Signed)
Pt states he understands instructions. Home stable with father.

## 2015-12-09 NOTE — ED Notes (Signed)
Patient here with 1 month of heart fluttering. Denies any associated symptoms. States he has had same before and has MS. No shortness of breath complains of chest tightness.

## 2015-12-09 NOTE — Discharge Instructions (Signed)
There does not appear to be an emergent cause for your symptoms at this time. Your exam, labs, EKG and chest x-ray are all reassuring. Please follow-up with your doctor for further evaluation and management of your symptoms. Return to ED for new or worsening symptoms as we discussed.  Palpitations A palpitation is the feeling that your heartbeat is irregular or is faster than normal. It may feel like your heart is fluttering or skipping a beat. Palpitations are usually not a serious problem. However, in some cases, you may need further medical evaluation. CAUSES  Palpitations can be caused by:  Smoking.  Caffeine or other stimulants, such as diet pills or energy drinks.  Alcohol.  Stress and anxiety.  Strenuous physical activity.  Fatigue.  Certain medicines.  Heart disease, especially if you have a history of irregular heart rhythms (arrhythmias), such as atrial fibrillation, atrial flutter, or supraventricular tachycardia.  An improperly working pacemaker or defibrillator. DIAGNOSIS  To find the cause of your palpitations, your health care provider will take your medical history and perform a physical exam. Your health care provider may also have you take a test called an ambulatory electrocardiogram (ECG). An ECG records your heartbeat patterns over a 24-hour period. You may also have other tests, such as:  Transthoracic echocardiogram (TTE). During echocardiography, sound waves are used to evaluate how blood flows through your heart.  Transesophageal echocardiogram (TEE).  Cardiac monitoring. This allows your health care provider to monitor your heart rate and rhythm in real time.  Holter monitor. This is a portable device that records your heartbeat and can help diagnose heart arrhythmias. It allows your health care provider to track your heart activity for several days, if needed.  Stress tests by exercise or by giving medicine that makes the heart beat faster. TREATMENT    Treatment of palpitations depends on the cause of your symptoms and can vary greatly. Most cases of palpitations do not require any treatment other than time, relaxation, and monitoring your symptoms. Other causes, such as atrial fibrillation, atrial flutter, or supraventricular tachycardia, usually require further treatment. HOME CARE INSTRUCTIONS   Avoid:  Caffeinated coffee, tea, soft drinks, diet pills, and energy drinks.  Chocolate.  Alcohol.  Stop smoking if you smoke.  Reduce your stress and anxiety. Things that can help you relax include:  A method of controlling things in your body, such as your heartbeats, with your mind (biofeedback).  Yoga.  Meditation.  Physical activity such as swimming, jogging, or walking.  Get plenty of rest and sleep. SEEK MEDICAL CARE IF:   You continue to have a fast or irregular heartbeat beyond 24 hours.  Your palpitations occur more often. SEEK IMMEDIATE MEDICAL CARE IF:  You have chest pain or shortness of breath.  You have a severe headache.  You feel dizzy or you faint. MAKE SURE YOU:  Understand these instructions.  Will watch your condition.  Will get help right away if you are not doing well or get worse.   This information is not intended to replace advice given to you by your health care provider. Make sure you discuss any questions you have with your health care provider.   Document Released: 06/20/2000 Document Revised: 06/28/2013 Document Reviewed: 08/22/2011 Elsevier Interactive Patient Education [employer]

## 2015-12-09 NOTE — ED Notes (Signed)
Patient transported to X-ray 

## 2016-01-03 ENCOUNTER — Encounter (HOSPITAL_COMMUNITY): Payer: Self-pay | Admitting: Emergency Medicine

## 2016-01-03 ENCOUNTER — Emergency Department (HOSPITAL_COMMUNITY)
Admission: EM | Admit: 2016-01-03 | Discharge: 2016-01-04 | Disposition: A | Discharge status: Home / Self Care | Payer: Medicare Other | Attending: Emergency Medicine | Admitting: Emergency Medicine

## 2016-01-03 ENCOUNTER — Emergency Department (HOSPITAL_COMMUNITY): Payer: Medicare Other

## 2016-01-03 DIAGNOSIS — F17210 Nicotine dependence, cigarettes, uncomplicated: Secondary | ICD-10-CM | POA: Diagnosis not present

## 2016-01-03 DIAGNOSIS — Z5321 Procedure and treatment not carried out due to patient leaving prior to being seen by health care provider: Secondary | ICD-10-CM | POA: Diagnosis not present

## 2016-01-03 DIAGNOSIS — I10 Essential (primary) hypertension: Secondary | ICD-10-CM | POA: Diagnosis not present

## 2016-01-03 DIAGNOSIS — R079 Chest pain, unspecified: Secondary | ICD-10-CM | POA: Insufficient documentation

## 2016-01-03 LAB — BASIC METABOLIC PANEL
Anion gap: 5 (ref 5–15)
BUN: 9 mg/dL (ref 6–20)
CO2: 29 mmol/L (ref 22–32)
Calcium: 9.4 mg/dL (ref 8.9–10.3)
Chloride: 108 mmol/L (ref 101–111)
Creatinine, Ser: 1.02 mg/dL (ref 0.61–1.24)
GFR calc Af Amer: >60 mL/min (ref >60)
GFR calc non Af Amer: >60 mL/min (ref >60)
Glucose, Bld: 104 mg/dL — ABNORMAL HIGH (ref 65–99)
Potassium: 4.1 mmol/L (ref 3.5–5.1)
Sodium: 142 mmol/L (ref 135–145)

## 2016-01-03 LAB — CBC
HCT: 41.6 % (ref 39.0–52.0)
Hemoglobin: 13 g/dL (ref 13.0–17.0)
MCH: 25.4 pg — ABNORMAL LOW (ref 26.0–34.0)
MCHC: 31.3 g/dL (ref 30.0–36.0)
MCV: 81.4 fL (ref 78.0–100.0)
Platelets: 150 10*3/uL (ref 150–400)
RBC: 5.11 MIL/uL (ref 4.22–5.81)
RDW: 14.1 % (ref 11.5–15.5)
WBC: 5.9 10*3/uL (ref 4.0–10.5)

## 2016-01-03 LAB — I-STAT TROPONIN, ED: Troponin i, poc: 0 ng/mL (ref 0.00–0.08)

## 2016-01-03 NOTE — ED Notes (Signed)
Pt sts left sided CP x months worse with palpation and movement

## 2016-01-04 ENCOUNTER — Encounter (HOSPITAL_COMMUNITY): Admission: RE | Admit: 2016-01-04 | Payer: Medicare Other | Source: Ambulatory Visit

## 2016-01-04 ENCOUNTER — Telehealth: Payer: Self-pay

## 2016-01-04 NOTE — Telephone Encounter (Addendum)
On Call Doc received phone mssg from pt's mother stating that [facility] turned him away from the facility today and he was not able to get his Tysabri infusion. Called [facility] and spoke to [place] who reported that they received Notice of Patient Discontinuation of Tysabri Therapy saying that patient is currently not authorized to receive Tysabri at their facility. Called [facility] @ [phone] and was told that pt was approved for therapy through 03/24/16. However, Intra[facility] nurse had pulled patient to [facility] facility on 12/10/15. [facility] rep said that [facility] could pull pt back if needing to administer medication today. Called Dee back @ [facility] but she said that they would be unable to re-schedule pt's infusion today and that they do not have access to pull pt back to their facility. Discussed w/ both Dr. Frances Furbish (on-call) and Dr. Anne Hahn, received ok to wait until Monday to re-schedule infusion. Returned call to pt's mother and let her know what happened and she was very understanding. Said that she never was contacted by [employer] to discuss possibiltiy of grant to help w/ co-pays and non-covered infusion costs. Pt/mom would like to have infusions done at [facility] if possible. Will discuss scheduling w/ infusion RN on Monday.

## 2016-01-07 NOTE — Telephone Encounter (Signed)
Spoke to Doran in [place]. She called and had facility transferred from [facility] back to [facility] site. Talked to [employer] about possible financial assistance and was told that transportation programs were available. However, pt would have to use [facility] as they accept both Medicare and Medicaid. Called Dee @ [facility] and she reported that 1st availabe appt was at 8 am the week after next. If patient needs later appt time d/t use of public transportation, 02/29/16 @ 11 am is available. Notified pt's mother that she would need to call [employer] [phone]) to give permission to transfer facility back to [facility] Called Dee back and appt scheduled Wed 01/23/16 @ 8 a.m. Pt/family made aware of appt day/time via TC.

## 2016-01-11 ENCOUNTER — Telehealth: Payer: Self-pay | Admitting: *Deleted

## 2016-01-11 NOTE — Telephone Encounter (Signed)
I LMVM for pt that am calling for information relating to SCAT form that we received.

## 2016-01-14 NOTE — Telephone Encounter (Signed)
LMVM for pt that trying to finish SCAT form.  Needed to ask him some questions.  Please return call.

## 2016-01-18 NOTE — Telephone Encounter (Signed)
Spoke with mother of pt since pt has not called back about the SCAT application.   I completed and sent to MM/NP for review and signature.  Once signed can be faxed to SC[facility].

## 2016-01-21 NOTE — Telephone Encounter (Signed)
Signed and given to Sandy RN 

## 2016-01-22 ENCOUNTER — Telehealth: Payer: Self-pay | Admitting: *Deleted

## 2016-01-22 NOTE — Telephone Encounter (Signed)
To MR. 

## 2016-01-22 NOTE — Telephone Encounter (Signed)
Pt scat application faxed on 01/22/2016 to [phone]

## 2016-01-23 ENCOUNTER — Encounter (HOSPITAL_COMMUNITY)
Admission: RE | Admit: 2016-01-23 | Discharge: 2016-01-23 | Disposition: A | Discharge status: Home / Self Care | Payer: Medicare Other | Source: Ambulatory Visit | Attending: Neurology | Admitting: Neurology

## 2016-01-23 ENCOUNTER — Encounter (HOSPITAL_COMMUNITY): Payer: Self-pay

## 2016-01-23 DIAGNOSIS — G35 Multiple sclerosis: Secondary | ICD-10-CM | POA: Diagnosis present

## 2016-01-23 MED ORDER — ACETAMINOPHEN 500 MG PO TABS
1000.0000 mg | ORAL_TABLET | ORAL | Status: DC
  Administered 2016-01-23: 1000 mg via ORAL
  Filled 2016-01-23: qty 2

## 2016-01-23 MED ORDER — SODIUM CHLORIDE 0.9 % IV SOLN
INTRAVENOUS | Status: DC
  Administered 2016-01-23: 09:00:00 via INTRAVENOUS

## 2016-01-23 MED ORDER — SODIUM CHLORIDE 0.9 % IV SOLN
300.0000 mg | INTRAVENOUS | Status: DC
  Administered 2016-01-23: 300 mg via INTRAVENOUS
  Filled 2016-01-23: qty 15

## 2016-01-23 MED ORDER — LORATADINE 10 MG PO TABS
10.0000 mg | ORAL_TABLET | ORAL | Status: DC
  Administered 2016-01-23: 10 mg via ORAL
  Filled 2016-01-23: qty 1

## 2016-01-23 NOTE — Progress Notes (Signed)
Uneventful Tysabri infusion.  Pt calm and quiet today.  Answered questions appropriately.  Pt stayed 15 min.post infusion for flush and then left ambulatory with father.  Pt knows to call MD with questions and concerns.

## 2016-01-29 ENCOUNTER — Encounter (HOSPITAL_COMMUNITY): Payer: Medicare Other

## 2016-02-06 ENCOUNTER — Encounter (HOSPITAL_COMMUNITY): Payer: Medicare Other

## 2016-02-15 ENCOUNTER — Telehealth: Payer: Self-pay | Admitting: Neurology

## 2016-02-15 NOTE — Telephone Encounter (Signed)
Dee @ [facility] is calling to get an order for the patient to have Tysabri early next week.

## 2016-02-15 NOTE — Telephone Encounter (Signed)
Order form completed, signed and faxed to [facility].

## 2016-02-20 ENCOUNTER — Encounter (HOSPITAL_COMMUNITY): Payer: Self-pay

## 2016-02-20 ENCOUNTER — Encounter (HOSPITAL_COMMUNITY)
Admission: RE | Admit: 2016-02-20 | Discharge: 2016-02-20 | Disposition: A | Discharge status: Home / Self Care | Payer: Medicare Other | Source: Ambulatory Visit | Attending: Neurology | Admitting: Neurology

## 2016-02-20 DIAGNOSIS — G35 Multiple sclerosis: Secondary | ICD-10-CM | POA: Insufficient documentation

## 2016-02-20 MED ORDER — SODIUM CHLORIDE 0.9 % IV SOLN
300.0000 mg | INTRAVENOUS | Status: DC
  Administered 2016-02-20: 300 mg via INTRAVENOUS
  Filled 2016-02-20: qty 15

## 2016-02-20 MED ORDER — SODIUM CHLORIDE 0.9 % IV SOLN
INTRAVENOUS | Status: DC
  Administered 2016-02-20: 08:00:00 via INTRAVENOUS

## 2016-02-20 NOTE — Discharge Instructions (Signed)

## 2016-03-05 ENCOUNTER — Encounter (HOSPITAL_COMMUNITY): Payer: Medicare Other

## 2016-03-25 ENCOUNTER — Encounter (HOSPITAL_COMMUNITY): Payer: Self-pay

## 2016-03-25 ENCOUNTER — Encounter (HOSPITAL_COMMUNITY)
Admission: RE | Admit: 2016-03-25 | Discharge: 2016-03-25 | Disposition: A | Discharge status: Home / Self Care | Payer: Medicare Other | Source: Ambulatory Visit | Attending: Neurology | Admitting: Neurology

## 2016-03-25 DIAGNOSIS — G35 Multiple sclerosis: Secondary | ICD-10-CM | POA: Diagnosis not present

## 2016-03-25 MED ORDER — SODIUM CHLORIDE 0.9 % IV SOLN
INTRAVENOUS | Status: DC
  Administered 2016-03-25: 11:00:00 via INTRAVENOUS

## 2016-03-25 MED ORDER — SODIUM CHLORIDE 0.9 % IV SOLN
300.0000 mg | INTRAVENOUS | Status: DC
  Administered 2016-03-25: 300 mg via INTRAVENOUS
  Filled 2016-03-25: qty 15

## 2016-03-25 NOTE — Discharge Instructions (Signed)

## 2016-03-28 ENCOUNTER — Encounter: Payer: Self-pay | Admitting: Neurology

## 2016-03-28 ENCOUNTER — Ambulatory Visit (INDEPENDENT_AMBULATORY_CARE_PROVIDER_SITE_OTHER): Payer: Medicare Other | Admitting: Neurology

## 2016-03-28 VITALS — BP 118/82 | HR 90 | Ht 73.0 in | Wt 322.0 lb

## 2016-03-28 DIAGNOSIS — F259 Schizoaffective disorder, unspecified: Secondary | ICD-10-CM

## 2016-03-28 DIAGNOSIS — G35 Multiple sclerosis: Secondary | ICD-10-CM | POA: Diagnosis not present

## 2016-03-28 DIAGNOSIS — R269 Unspecified abnormalities of gait and mobility: Secondary | ICD-10-CM

## 2016-03-28 DIAGNOSIS — Z5181 Encounter for therapeutic drug level monitoring: Secondary | ICD-10-CM

## 2016-03-28 MED ORDER — ALPRAZOLAM 0.5 MG PO TABS: ORAL_TABLET | ORAL | 0 refills | Status: DC

## 2016-03-28 NOTE — Progress Notes (Signed)
Reason for visit: Multiple sclerosis  Brandon [name] is an 25 y.o. male  History of present illness:  Brandon [name] is a 25 year old right-handed black male with a history of a schizoaffective disorder and bipolar disorder on antipsychotic medications. The patient has multiple sclerosis, he has had a severe hemiparesis in the past that gradually improved. The patient gets Tysabri taking this IV preparation once a month. If he does not get the medication on schedule, he will develop some increased weakness on the left arm and leg, this improves after the Tysabri treatment. The patient has not had any new symptoms of numbness, weakness, balance changes, vision changes or swallowing problems. The patient is drooling some because of the medication effects. The patient returns for an evaluation.  Past Medical History:  Diagnosis Date  . ADHD (attention deficit hyperactivity disorder)   . Bipolar 1 disorder (HCC)   . Chronic back pain   . Chronic neck pain   . Hypertension   . Multiple sclerosis (HCC) 05/20/2013   left sided weakness, dysarthria  . Non-compliance   . Obesity   . Schizophrenia (HCC)   . White matter abnormality on MRI of brain 11/23/2012    Past Surgical History:  Procedure Laterality Date  . None      Family History  Problem Relation Age of Onset  . Diabetes Mother   . ADD / ADHD Brother     Social history:  reports that he has been smoking Cigarettes.  He has been smoking about 0.25 packs per day. He has never used smokeless tobacco. He reports that he drinks alcohol. He reports that he uses drugs, including Marijuana.   No Known Allergies  Medications:  Prior to Admission medications   Medication Sig Start Date End Date Taking? Authorizing Provider  amLODipine (NORVASC) 10 MG tablet Take 10 mg by mouth daily.   Yes Historical Provider, MD  benztropine (COGENTIN) 1 MG tablet Take 1 tablet (1 mg total) by mouth 2 (two) times daily. 04/02/15  Yes Charm RingsJamison Y Lord,  NP  cloZAPine (CLOZARIL) 100 MG tablet Take 100 mg by mouth. Taking 1 tablet in AM and 1 1/2 tablet po QHS.   Yes Historical Provider, MD  docusate sodium (COLACE) 100 MG capsule Take 100 mg by mouth 2 (two) times daily.    Yes Historical Provider, MD  INVEGA SUSTENNA 234 MG/1.5ML SUSP injection Inject 234 mg as directed every 21 ( twenty-one) days. 05/16/15  Yes Historical Provider, MD  Melatonin 5 MG TABS Take 10 mg by mouth at bedtime.    Yes Historical Provider, MD  OLANZapine zydis (ZYPREXA) 10 MG disintegrating tablet Take 10 mg by mouth at bedtime.   Yes Historical Provider, MD  propranolol (INDERAL) 10 MG tablet Take 1 tablet (10 mg total) by mouth 2 (two) times daily. 05/03/15  Yes Charm RingsJamison Y Lord, NP  traZODone (DESYREL) 100 MG tablet Take 200 mg by mouth at bedtime as needed for sleep.  05/14/15  Yes Historical Provider, MD    ROS:  Out of a complete 14 system review of symptoms, the patient complains only of the following symptoms, and all other reviewed systems are negative.  Drooling Blurred vision Excessive eating Constipation, rectal pain, incontinence of bowels Difficulty urinating Muscle cramps, walking difficulty Hallucinations  Blood pressure 118/82, pulse 90, height 6\' 1"  (1.854 m), weight (!) 322 lb (146.1 kg).  Physical Exam  General: The patient is alert and cooperative at the time of the examination. The patient  is markedly obese.  Skin: No significant peripheral edema is noted.   Neurologic Exam  Mental status: The patient is alert and oriented x 3 at the time of the examination. The patient has apparent normal recent and remote memory, with an apparently normal attention span and concentration ability.   Cranial nerves: Facial symmetry is present. Speech is normal, no aphasia or dysarthria is noted. Extraocular movements are full. Visual fields are full. Pupils are equal, round, and reactive to light. Discs are flat bilaterally.  Motor: The patient has  good strength in all 4 extremities, with exception of slight weakness with the left deltoid and left hip flexion.  Sensory examination: Soft touch sensation is symmetric on the face, arms, and legs.  Coordination: The patient has good finger-nose-finger and heel-to-shin bilaterally.  Gait and station: The patient has a slightly wide-based, unsteady gait. Tandem gait is unsteady. Romberg is negative.  Reflexes: Deep tendon reflexes are symmetric.   Assessment/Plan:  1. Multiple sclerosis  2. Gait disorder  3. Mild left hemiparesis  The patient is on Tysabri, he will have blood work today, he will have a repeat a MRI of the brain. A prescription was given for alprazolam for the MRI scan. He will follow-up in 6 months, sooner if needed. He will continue the Tysabri.  Marlan Palau MD 03/28/2016 9:10 AM  [facility] [address] [place], [place] [address]  Phone [phone] Fax [phone]

## 2016-03-29 LAB — COMPREHENSIVE METABOLIC PANEL
ALT: 45 IU/L — ABNORMAL HIGH (ref 0–44)
AST: 13 IU/L (ref 0–40)
Albumin/Globulin Ratio: 1.5 (ref 1.2–2.2)
Albumin: 4.2 g/dL (ref 3.5–5.5)
Alkaline Phosphatase: 116 IU/L (ref 39–117)
BUN/Creatinine Ratio: 12 (ref 9–20)
BUN: 11 mg/dL (ref 6–20)
Bilirubin Total: 0.3 mg/dL (ref 0.0–1.2)
CO2: 23 mmol/L (ref 18–29)
Calcium: 9.3 mg/dL (ref 8.7–10.2)
Chloride: 103 mmol/L (ref 96–106)
Creatinine, Ser: 0.91 mg/dL (ref 0.76–1.27)
GFR calc Af Amer: 136 mL/min/{1.73_m2} (ref >59)
GFR calc non Af Amer: 118 mL/min/{1.73_m2} (ref >59)
Globulin, Total: 2.8 g/dL (ref 1.5–4.5)
Glucose: 98 mg/dL (ref 65–99)
Potassium: 4.3 mmol/L (ref 3.5–5.2)
Sodium: 143 mmol/L (ref 134–144)
Total Protein: 7 g/dL (ref 6.0–8.5)

## 2016-03-29 LAB — CBC WITH DIFFERENTIAL/PLATELET
Basophils Absolute: 0.1 10*3/uL (ref 0.0–0.2)
Basos: 1 %
EOS (ABSOLUTE): 0.4 10*3/uL (ref 0.0–0.4)
Eos: 5 %
Hematocrit: 41.3 % (ref 37.5–51.0)
Hemoglobin: 13.1 g/dL (ref 12.6–17.7)
Immature Grans (Abs): 0.1 10*3/uL (ref 0.0–0.1)
Immature Granulocytes: 1 %
Lymphocytes Absolute: 3.4 10*3/uL — ABNORMAL HIGH (ref 0.7–3.1)
Lymphs: 45 %
MCH: 25.3 pg — ABNORMAL LOW (ref 26.6–33.0)
MCHC: 31.7 g/dL (ref 31.5–35.7)
MCV: 80 fL (ref 79–97)
Monocytes Absolute: 0.5 10*3/uL (ref 0.1–0.9)
Monocytes: 7 %
Neutrophils Absolute: 3 10*3/uL (ref 1.4–7.0)
Neutrophils: 41 %
Platelets: 191 10*3/uL (ref 150–379)
RBC: 5.17 x10E6/uL (ref 4.14–5.80)
RDW: 15.2 % (ref 12.3–15.4)
WBC: 7.3 10*3/uL (ref 3.4–10.8)

## 2016-03-31 ENCOUNTER — Telehealth: Payer: Self-pay

## 2016-03-31 NOTE — Telephone Encounter (Signed)
Called pt w/ unremarkable lab results other than minimally elevated ALT. May call back w/ additional questions/concerns.

## 2016-03-31 NOTE — Telephone Encounter (Signed)
-----   Message from York Spaniel, MD sent at 03/30/2016  4:38 PM EDT ----- Blood work is unremarkable, minimal non-significant ALT elevation. Please call the patient. ----- Message ----- From: Nell Range Lab Results In Sent: 03/29/2016   5:40 AM To: York Spaniel, MD

## 2016-04-03 ENCOUNTER — Telehealth: Payer: Self-pay | Admitting: Neurology

## 2016-04-03 NOTE — Telephone Encounter (Signed)
Mother called to advise, SCAT form was never received by [facility]. Please re-fax to [facility] [phone].

## 2016-04-07 ENCOUNTER — Telehealth: Payer: Self-pay | Admitting: Neurology

## 2016-04-07 NOTE — Telephone Encounter (Signed)
Form was not received by our office. However, letter was written stating that [facility] and transported to/from by [facility]. May call if additional info is required.

## 2016-04-07 NOTE — Telephone Encounter (Signed)
The JC virus antibody level is negative.

## 2016-04-19 ENCOUNTER — Emergency Department (HOSPITAL_COMMUNITY): Payer: Medicare Other

## 2016-04-19 ENCOUNTER — Emergency Department (HOSPITAL_COMMUNITY)
Admission: EM | Admit: 2016-04-19 | Discharge: 2016-04-20 | Disposition: A | Discharge status: Home / Self Care | Payer: Medicare Other | Attending: Emergency Medicine | Admitting: Emergency Medicine

## 2016-04-19 ENCOUNTER — Encounter (HOSPITAL_COMMUNITY): Payer: Self-pay | Admitting: Emergency Medicine

## 2016-04-19 DIAGNOSIS — Z5321 Procedure and treatment not carried out due to patient leaving prior to being seen by health care provider: Secondary | ICD-10-CM | POA: Insufficient documentation

## 2016-04-19 DIAGNOSIS — M25552 Pain in left hip: Secondary | ICD-10-CM | POA: Diagnosis not present

## 2016-04-19 NOTE — ED Notes (Signed)
Unable to locate pt in lobby x 1

## 2016-04-19 NOTE — ED Triage Notes (Signed)
Pt. slipped and fell today , denies LOC / ambulatory , reports pain at left hip and left calf.

## 2016-04-20 NOTE — ED Notes (Signed)
Pt called with no answer x2.  

## 2016-04-20 NOTE — ED Notes (Signed)
No answer when called 

## 2016-04-22 ENCOUNTER — Encounter (HOSPITAL_COMMUNITY)
Admission: RE | Admit: 2016-04-22 | Discharge: 2016-04-22 | Disposition: A | Discharge status: Home / Self Care | Payer: Medicare Other | Source: Ambulatory Visit | Attending: Neurology | Admitting: Neurology

## 2016-04-22 ENCOUNTER — Telehealth: Payer: Self-pay | Admitting: Neurology

## 2016-04-22 DIAGNOSIS — G35 Multiple sclerosis: Secondary | ICD-10-CM | POA: Insufficient documentation

## 2016-04-22 MED ORDER — SODIUM CHLORIDE 0.9 % IV SOLN
INTRAVENOUS | Status: DC
  Administered 2016-04-22: 10:00:00 via INTRAVENOUS

## 2016-04-22 MED ORDER — SODIUM CHLORIDE 0.9 % IV SOLN
300.0000 mg | INTRAVENOUS | Status: DC
  Administered 2016-04-22: 300 mg via INTRAVENOUS
  Filled 2016-04-22: qty 15

## 2016-04-22 NOTE — Progress Notes (Signed)
Uneventful infusion of TYSABRI . Pt calm today and answered questions appropriately. Slept during infusion. Stayed on 15 minutes post infusion and left ambulatory with Mom.

## 2016-04-22 NOTE — Telephone Encounter (Signed)
Mom called regarding scheduling of MRI/CAT appointment. Phone number given to [facility].

## 2016-04-25 NOTE — Telephone Encounter (Signed)
Noted  

## 2016-04-29 NOTE — Telephone Encounter (Signed)
Patient has MR Brain w/wo contrast scheduled at [facility] on 05/12/16.

## 2016-05-06 ENCOUNTER — Other Ambulatory Visit: Payer: Self-pay

## 2016-05-12 ENCOUNTER — Telehealth: Payer: Self-pay | Admitting: Neurology

## 2016-05-12 ENCOUNTER — Ambulatory Visit
Admission: RE | Admit: 2016-05-12 | Discharge: 2016-05-12 | Disposition: A | Discharge status: Home / Self Care | Payer: Medicare Other | Source: Ambulatory Visit | Attending: Neurology | Admitting: Neurology

## 2016-05-12 DIAGNOSIS — G35 Multiple sclerosis: Secondary | ICD-10-CM | POA: Diagnosis not present

## 2016-05-12 MED ORDER — GADOBENATE DIMEGLUMINE 529 MG/ML IV SOLN
20.0000 mL | Freq: Once | INTRAVENOUS | Status: AC | PRN
  Administered 2016-05-12: 20 mL via INTRAVENOUS

## 2016-05-12 NOTE — Telephone Encounter (Signed)
  I called the mother. MRI the brain appears to be stable from 1 year ago. Continue on with Tysabri treatments.  MRI brain 05/12/16:  IMPRESSION:  This MRI of the brain with and without contrast shows the following: 1.   Multiple supratentorial and a couple infratentorial T2/FLAIR hyperintense foci consistent with chronic demyelinating plaque associated with multiple sclerosis. None of the foci appears to be acute.  2.   When compared to the prior MRI dated 03/23/2015, there is no interval change. 3.   There is mild cortical atrophy. 4.    There are no acute findings. There is a normal enhancement pattern.

## 2016-05-14 ENCOUNTER — Other Ambulatory Visit: Payer: Self-pay | Admitting: Neurology

## 2016-05-14 DIAGNOSIS — G35 Multiple sclerosis: Secondary | ICD-10-CM

## 2016-05-21 ENCOUNTER — Encounter (HOSPITAL_COMMUNITY): Payer: Medicare Other

## 2016-05-23 ENCOUNTER — Encounter (HOSPITAL_COMMUNITY): Payer: Self-pay

## 2016-05-23 ENCOUNTER — Encounter (HOSPITAL_COMMUNITY)
Admission: RE | Admit: 2016-05-23 | Discharge: 2016-05-23 | Disposition: A | Discharge status: Home / Self Care | Payer: Medicare Other | Source: Ambulatory Visit | Attending: Neurology | Admitting: Neurology

## 2016-05-23 DIAGNOSIS — G35 Multiple sclerosis: Secondary | ICD-10-CM | POA: Insufficient documentation

## 2016-05-23 MED ORDER — SODIUM CHLORIDE 0.9 % IV SOLN
Freq: Once | INTRAVENOUS | Status: AC
  Administered 2016-05-23: 11:00:00 via INTRAVENOUS

## 2016-05-23 MED ORDER — SODIUM CHLORIDE 0.9 % IV SOLN
500.0000 mg | Freq: Once | INTRAVENOUS | Status: DC | PRN
  Filled 2016-05-23: qty 4

## 2016-05-23 MED ORDER — SODIUM CHLORIDE 0.9 % IV SOLN
300.0000 mg | INTRAVENOUS | Status: DC
  Administered 2016-05-23: 300 mg via INTRAVENOUS
  Filled 2016-05-23: qty 15

## 2016-05-23 MED ORDER — ACETAMINOPHEN 325 MG PO TABS
650.0000 mg | ORAL_TABLET | ORAL | Status: DC
  Administered 2016-05-23: 650 mg via ORAL
  Filled 2016-05-23: qty 2

## 2016-05-23 MED ORDER — LORATADINE 10 MG PO TABS
10.0000 mg | ORAL_TABLET | ORAL | Status: DC
  Administered 2016-05-23: 10 mg via ORAL
  Filled 2016-05-23: qty 1

## 2016-05-23 MED ORDER — EPINEPHRINE 0.3 MG/0.3ML IJ SOAJ: 0.3000 mg | Freq: Once | INTRAMUSCULAR | Status: DC | PRN

## 2016-05-23 NOTE — Progress Notes (Signed)
Post-infusion Tysabri, pt. Stayed 15 minutes, pt. To follow-up with doctor with any problems. 

## 2016-06-20 ENCOUNTER — Encounter (HOSPITAL_COMMUNITY): Payer: Medicare Other

## 2016-06-20 ENCOUNTER — Encounter (HOSPITAL_COMMUNITY)
Admission: RE | Admit: 2016-06-20 | Discharge: 2016-06-20 | Disposition: A | Discharge status: Home / Self Care | Payer: Medicare Other | Source: Ambulatory Visit | Attending: Neurology | Admitting: Neurology

## 2016-06-20 ENCOUNTER — Encounter (HOSPITAL_COMMUNITY): Payer: Self-pay

## 2016-06-20 DIAGNOSIS — G35 Multiple sclerosis: Secondary | ICD-10-CM | POA: Insufficient documentation

## 2016-06-20 MED ORDER — SODIUM CHLORIDE 0.9 % IV SOLN
Freq: Once | INTRAVENOUS | Status: AC
  Administered 2016-06-20: 11:00:00 via INTRAVENOUS

## 2016-06-20 MED ORDER — ACETAMINOPHEN 325 MG PO TABS
650.0000 mg | ORAL_TABLET | ORAL | Status: DC
  Administered 2016-06-20: 650 mg via ORAL
  Filled 2016-06-20: qty 2

## 2016-06-20 MED ORDER — LORATADINE 10 MG PO TABS
10.0000 mg | ORAL_TABLET | ORAL | Status: DC
  Administered 2016-06-20: 10 mg via ORAL
  Filled 2016-06-20: qty 1

## 2016-06-20 MED ORDER — SODIUM CHLORIDE 0.9 % IV SOLN
300.0000 mg | INTRAVENOUS | Status: DC
  Administered 2016-06-20: 300 mg via INTRAVENOUS
  Filled 2016-06-20: qty 15

## 2016-06-20 NOTE — Progress Notes (Signed)
Pt. Stayed 20 minutes post infusion Tysabri. Pt. To follow-up with doctor with any problems.

## 2016-07-18 ENCOUNTER — Encounter (HOSPITAL_COMMUNITY)
Admission: RE | Admit: 2016-07-18 | Discharge: 2016-07-18 | Disposition: A | Discharge status: Home / Self Care | Payer: Medicare Other | Source: Ambulatory Visit | Attending: Neurology | Admitting: Neurology

## 2016-07-18 ENCOUNTER — Other Ambulatory Visit: Payer: Self-pay | Admitting: Neurology

## 2016-07-18 DIAGNOSIS — G35 Multiple sclerosis: Secondary | ICD-10-CM | POA: Diagnosis present

## 2016-07-18 MED ORDER — SODIUM CHLORIDE 0.9 % IV SOLN
300.0000 mg | INTRAVENOUS | Status: DC
  Administered 2016-07-18: 300 mg via INTRAVENOUS
  Filled 2016-07-18: qty 15

## 2016-07-18 MED ORDER — ACETAMINOPHEN 325 MG PO TABS
650.0000 mg | ORAL_TABLET | ORAL | Status: AC
  Administered 2016-07-18: 650 mg via ORAL
  Filled 2016-07-18: qty 2

## 2016-07-18 MED ORDER — SODIUM CHLORIDE 0.9 % IV SOLN
Freq: Once | INTRAVENOUS | Status: AC
  Administered 2016-07-18: 11:00:00 via INTRAVENOUS

## 2016-07-18 MED ORDER — LORATADINE 10 MG PO TABS
10.0000 mg | ORAL_TABLET | ORAL | Status: AC
  Administered 2016-07-18: 10 mg via ORAL
  Filled 2016-07-18: qty 1

## 2016-07-18 NOTE — Progress Notes (Signed)
Uneventful infusion of Tysabri. Pt ambulated into short stay for his infusion but immediately fell asleep and difficult to arouse. Mom states he took all his morning medications just before arrival. Pt and Mom stayed only 20 minutes of the suggested TOUCH 1 hour post observation and will contact Dr for any questions or concerns. Pt was able to pivot to w/c but still very drowsy and was discharged to lobby accompanied by staff and Mom to get on [facility] for transport home.

## 2016-07-21 ENCOUNTER — Telehealth: Payer: Self-pay

## 2016-07-21 NOTE — Telephone Encounter (Signed)
-----   Message from Katherene Ponto, [place] sent at 07/18/2016 12:37 PM EST ----- Regarding: Request for New Tysabri Orders Contact: [phone] Good afternoon. This message is being sent to request new Tysabri orders for Brandon [name] next infusion please. Thank you in advance.  [employer]

## 2016-07-21 NOTE — Telephone Encounter (Signed)
Faxed as requested

## 2016-08-15 ENCOUNTER — Encounter (HOSPITAL_COMMUNITY)
Admission: RE | Admit: 2016-08-15 | Discharge: 2016-08-15 | Disposition: A | Discharge status: Home / Self Care | Payer: Medicare Other | Source: Ambulatory Visit | Attending: Neurology | Admitting: Neurology

## 2016-08-15 ENCOUNTER — Encounter (HOSPITAL_COMMUNITY): Payer: Self-pay

## 2016-08-15 DIAGNOSIS — G35 Multiple sclerosis: Secondary | ICD-10-CM | POA: Insufficient documentation

## 2016-08-15 MED ORDER — SODIUM CHLORIDE 0.9 % IV SOLN
300.0000 mg | INTRAVENOUS | Status: DC
  Administered 2016-08-15: 300 mg via INTRAVENOUS
  Filled 2016-08-15: qty 15

## 2016-08-15 MED ORDER — ACETAMINOPHEN 325 MG PO TABS
650.0000 mg | ORAL_TABLET | ORAL | Status: DC
  Administered 2016-08-15: 650 mg via ORAL
  Filled 2016-08-15: qty 2

## 2016-08-15 MED ORDER — LORATADINE 10 MG PO TABS
10.0000 mg | ORAL_TABLET | ORAL | Status: DC
  Administered 2016-08-15: 10 mg via ORAL
  Filled 2016-08-15: qty 1

## 2016-08-15 MED ORDER — SODIUM CHLORIDE 0.9 % IV SOLN
Freq: Once | INTRAVENOUS | Status: AC
  Administered 2016-08-15: 11:00:00 via INTRAVENOUS

## 2016-09-01 ENCOUNTER — Other Ambulatory Visit: Payer: Self-pay | Admitting: Neurology

## 2016-09-01 ENCOUNTER — Encounter: Payer: Self-pay | Admitting: *Deleted

## 2016-09-01 DIAGNOSIS — G35D Multiple sclerosis, unspecified: Secondary | ICD-10-CM

## 2016-09-01 DIAGNOSIS — G35 Multiple sclerosis: Secondary | ICD-10-CM

## 2016-09-01 NOTE — Progress Notes (Signed)
Faxed completed/signed [facility] paperwork re rx tysabri. Fax: [phone]. Received confirmation.

## 2016-09-01 NOTE — Progress Notes (Signed)
Received fax notification that pt approved via touch prescribing program from 09/24/2016-03/25/2017.  Patient enrollement number: [id number]. Site auth number: [id number].  Pt is to receive infusions from [facility].

## 2016-09-12 ENCOUNTER — Encounter (HOSPITAL_COMMUNITY): Payer: Self-pay

## 2016-09-12 ENCOUNTER — Encounter (HOSPITAL_COMMUNITY)
Admission: RE | Admit: 2016-09-12 | Discharge: 2016-09-12 | Disposition: A | Discharge status: Home / Self Care | Payer: Medicare Other | Source: Ambulatory Visit | Attending: Neurology | Admitting: Neurology

## 2016-09-12 DIAGNOSIS — G35 Multiple sclerosis: Secondary | ICD-10-CM | POA: Diagnosis not present

## 2016-09-12 MED ORDER — SODIUM CHLORIDE 0.9 % IV SOLN
300.0000 mg | INTRAVENOUS | Status: DC
  Administered 2016-09-12: 300 mg via INTRAVENOUS
  Filled 2016-09-12: qty 15

## 2016-09-12 MED ORDER — ACETAMINOPHEN 325 MG PO TABS
650.0000 mg | ORAL_TABLET | ORAL | Status: DC
Start: 2016-09-12 — End: 2016-09-13
  Administered 2016-09-12: 650 mg via ORAL
  Filled 2016-09-12: qty 2

## 2016-09-12 MED ORDER — LORATADINE 10 MG PO TABS
10.0000 mg | ORAL_TABLET | ORAL | Status: DC
  Administered 2016-09-12: 10 mg via ORAL
  Filled 2016-09-12: qty 1

## 2016-09-12 MED ORDER — SODIUM CHLORIDE 0.9 % IV SOLN
Freq: Once | INTRAVENOUS | Status: AC
  Administered 2016-09-12: 12:00:00 via INTRAVENOUS

## 2016-09-29 ENCOUNTER — Encounter: Payer: Self-pay | Admitting: Adult Health

## 2016-09-29 ENCOUNTER — Ambulatory Visit (INDEPENDENT_AMBULATORY_CARE_PROVIDER_SITE_OTHER): Payer: Medicare Other | Admitting: Adult Health

## 2016-09-29 VITALS — BP 124/81 | HR 90 | Ht 73.0 in | Wt 344.4 lb

## 2016-09-29 DIAGNOSIS — R269 Unspecified abnormalities of gait and mobility: Secondary | ICD-10-CM | POA: Diagnosis not present

## 2016-09-29 DIAGNOSIS — Z5181 Encounter for therapeutic drug level monitoring: Secondary | ICD-10-CM | POA: Diagnosis not present

## 2016-09-29 DIAGNOSIS — G35 Multiple sclerosis: Secondary | ICD-10-CM | POA: Diagnosis not present

## 2016-09-29 NOTE — Progress Notes (Signed)
I have read the note, and I agree with the clinical assessment and plan.  Brandon [name],Brandon [name]   

## 2016-09-29 NOTE — Progress Notes (Signed)
PATIENT: Brandon [name] DOB: 1990-07-15  REASON FOR VISIT: follow up multiple sclerosis, abnormality of gait HISTORY FROM: patient and mom  HISTORY OF PRESENT ILLNESS: Brandon [name] is a 26 year old male with a history of schizoaffective disorder, bipolar disorder and multiple sclerosis. He returns today for follow-up. The patient denies any new numbness or weakness. He does occasionally have urinary incontinence mainly due to urgency. This typically happens at bedtime. Denies any changes with his vision. He recently received new glasses. The patient's mother reports that his balance has been off for the last 2 days. She states that she has noticed him dragging the left leg more often. Denies any falls. She states that his balance and gait seems to worsen when he gets close to his next tysabri infusion. The patient had a repeat MRI in November that did not show any new changes. He returns today for an evaluation.   HISTORY 03/28/16: Brandon [name] is a 26 year old right-handed black male with a history of a schizoaffective disorder and bipolar disorder on antipsychotic medications. The patient has multiple sclerosis, he has had a severe hemiparesis in the past that gradually improved. The patient gets Tysabri taking this IV preparation once a month. If he does not get the medication on schedule, he will develop some increased weakness on the left arm and leg, this improves after the Tysabri treatment. The patient has not had any new symptoms of numbness, weakness, balance changes, vision changes or swallowing problems. The patient is drooling some because of the medication effects. The patient returns for an evaluation.   REVIEW OF SYSTEMS: Out of a complete 14 system review of symptoms, the patient complains only of the following symptoms, and all other reviewed systems are negative.  Drooling, cough, choking, insomnia, frequent waking, snoring, sleep talking, constipation, excessive eating, frequency  of urination, urgency, walking difficulty, decreased concentration, hallucinations, memory loss  ALLERGIES: No Known Allergies  HOME MEDICATIONS: Outpatient Medications Prior to Visit  Medication Sig Dispense Refill  . ALPRAZolam (XANAX) 0.5 MG tablet Take 2 tablets approximately 45 minutes prior to the MRI study, take a third tablet if needed. 3 tablet 0  . amLODipine (NORVASC) 10 MG tablet Take 10 mg by mouth daily.    . benztropine (COGENTIN) 1 MG tablet Take 1 tablet (1 mg total) by mouth 2 (two) times daily. 60 tablet 0  . cloZAPine (CLOZARIL) 100 MG tablet Take 100 mg by mouth. Currently taking 1/2 tablet at night    . docusate sodium (COLACE) 100 MG capsule Take 100 mg by mouth 2 (two) times daily.     Hinda Glatter SUSTENNA 234 MG/1.5ML SUSP injection Inject 234 mg as directed every 21 ( twenty-one) days.    . Melatonin 5 MG TABS Take 10 mg by mouth at bedtime.     Marland Kitchen OLANZapine zydis (ZYPREXA) 10 MG disintegrating tablet Take 10 mg by mouth at bedtime.    . paliperidone (INVEGA) 6 MG 24 hr tablet Take 6 mg by mouth daily.    . propranolol (INDERAL) 10 MG tablet Take 1 tablet (10 mg total) by mouth 2 (two) times daily. 60 tablet 0  . traZODone (DESYREL) 100 MG tablet Take 200 mg by mouth at bedtime as needed for sleep.      Facility-Administered Medications Prior to Visit  Medication Dose Route Frequency Provider Last Rate Last Dose  . natalizumab (TYSABRI) 300 mg in sodium chloride 0.9 % 100 mL IVPB  300 mg Intravenous Q28 days York Spaniel,  MD        PAST MEDICAL HISTORY: Past Medical History:  Diagnosis Date  . ADHD (attention deficit hyperactivity disorder)   . Bipolar 1 disorder (HCC)   . Chronic back pain   . Chronic neck pain   . Hypertension   . Multiple sclerosis (HCC) 05/20/2013   left sided weakness, dysarthria  . Non-compliance   . Obesity   . Schizophrenia (HCC)   . White matter abnormality on MRI of brain 11/23/2012    PAST SURGICAL HISTORY: Past Surgical  History:  Procedure Laterality Date  . None      FAMILY HISTORY: Family History  Problem Relation Age of Onset  . Diabetes Mother   . ADD / ADHD Brother     SOCIAL HISTORY: Social History   Social History  . Marital status: Single    Spouse name: N/A  . Number of children: 0  . Years of education: 11th   Occupational History  .  [employer]    Disbaled   Social History Main Topics  . Smoking status: Current Every Day Smoker    Packs/day: 0.25    Types: Cigarettes  . Smokeless tobacco: Never Used  . Alcohol use 0.0 oz/week     Comment: "A little bit"   . Drug use: Yes    Types: Marijuana     Comment: Last used: unknown   . Sexual activity: Not on file   Other Topics Concern  . Not on file   Social History Narrative   Patient lives at home with his mother.   Disabled.   Education 11 th grade .   Right handed.   Caffeine - one cup daily soda.      PHYSICAL EXAM  Vitals:   09/29/16 1249  BP: 124/81  Pulse: 90  Weight: (!) 344 lb 6.4 oz (156.2 kg)  Height: 6\' 1"  (1.854 m)   Body mass index is 45.44 kg/m.  Generalized: Well developed, in no acute distress   Neurological examination  Mentation: Alert oriented to time, place, history taking. Follows all commands speech and language fluent Cranial nerve II-XII: Pupils were equal round reactive to light. Extraocular movements were full, visual field were full on confrontational test. Facial sensation and strength were normal. Uvula tongue midline. Head turning and shoulder shrug  were normal and symmetric. Motor: The motor testing reveals 5 over 5 strength of all 4 extremities. Good symmetric motor tone is noted throughout.  Sensory: Sensory testing is intact to soft touch on all 4 extremities. No evidence of extinction is noted.  Coordination: Cerebellar testing reveals good finger-nose-finger and heel-to-shin bilaterally.  Gait and station: Patient does require assistance with standing. His gait is  wide-based. After he is walking for approximately 10-15 seconds he begins to drag his left leg. Often catching his toe on the floor. Reflexes: Deep tendon reflexes are symmetric and normal bilaterally.   DIAGNOSTIC DATA (LABS, IMAGING, TESTING) - I reviewed patient records, labs, notes, testing and imaging myself where available.  Lab Results  Component Value Date   WBC 7.3 03/28/2016   HGB 13.0 01/03/2016   HCT 41.3 03/28/2016   MCV 80 03/28/2016   PLT 191 03/28/2016      Component Value Date/Time   NA 143 03/28/2016 0943   K 4.3 03/28/2016 0943   CL 103 03/28/2016 0943   CO2 23 03/28/2016 0943   GLUCOSE 98 03/28/2016 0943   GLUCOSE 104 (H) 01/03/2016 1354   BUN 11 03/28/2016 0943  CREATININE 0.91 03/28/2016 0943   CALCIUM 9.3 03/28/2016 0943   PROT 7.0 03/28/2016 0943   ALBUMIN 4.2 03/28/2016 0943   AST 13 03/28/2016 0943   ALT 45 (H) 03/28/2016 0943   ALKPHOS 116 03/28/2016 0943   BILITOT 0.3 03/28/2016 0943   GFRNONAA 118 03/28/2016 0943   GFRAA 136 03/28/2016 0943   Lab Results  Component Value Date   CHOL 113 01/29/2011   HDL 33 (L) 01/29/2011   LDLCALC 70 01/29/2011   TRIG 52 01/29/2011   CHOLHDL 3.4 01/29/2011   Lab Results  Component Value Date   HGBA1C 5.9 (H) 04/02/2015   Lab Results  Component Value Date   VITAMINB12 696 07/03/2011   Lab Results  Component Value Date   TSH 0.848 01/18/2014      ASSESSMENT AND PLAN 25 y.o. year old male  has a past medical history of ADHD (attention deficit hyperactivity disorder); Bipolar 1 disorder (HCC); Chronic back pain; Chronic neck pain; Hypertension; Multiple sclerosis (HCC) (05/20/2013); Non-compliance; Obesity; Schizophrenia (HCC); and White matter abnormality on MRI of brain (11/23/2012). here with:  1. Multiple sclerosis 2. Abnormality of gait  The patient will continue on Tysabri. I will check blood work today. I have referred the patient for home health physical therapy due to abnormality of gait  and balance. The patient may benefit from an AFO brace on the left. We will consider repeating MRI of brain at the next visit. Advised that if his symptoms worsen or he develops new symptoms he should let us know. He will follow-up in 6 months or sooner if needed.     Butch Penny, MSN, NP-C 09/29/2016, 1:00 PM [facility] [address], [address] [place], [place] [address] [phone]

## 2016-09-29 NOTE — Patient Instructions (Signed)
Continue Tysabri Blood work today If your symptoms worsen or you develop new symptoms please let us know.   

## 2016-09-30 LAB — CBC WITH DIFFERENTIAL/PLATELET
Basophils Absolute: 0 10*3/uL (ref 0.0–0.2)
Basos: 0 %
EOS (ABSOLUTE): 0.3 10*3/uL (ref 0.0–0.4)
Eos: 6 %
Hematocrit: 41.6 % (ref 37.5–51.0)
Hemoglobin: 13.1 g/dL (ref 13.0–17.7)
Immature Grans (Abs): 0 10*3/uL (ref 0.0–0.1)
Immature Granulocytes: 0 %
Lymphocytes Absolute: 3.3 10*3/uL — ABNORMAL HIGH (ref 0.7–3.1)
Lymphs: 53 %
MCH: 24.9 pg — ABNORMAL LOW (ref 26.6–33.0)
MCHC: 31.5 g/dL (ref 31.5–35.7)
MCV: 79 fL (ref 79–97)
Monocytes Absolute: 0.3 10*3/uL (ref 0.1–0.9)
Monocytes: 5 %
Neutrophils Absolute: 2.2 10*3/uL (ref 1.4–7.0)
Neutrophils: 36 %
Platelets: 195 10*3/uL (ref 150–379)
RBC: 5.26 x10E6/uL (ref 4.14–5.80)
RDW: 15.4 % (ref 12.3–15.4)
WBC: 6.2 10*3/uL (ref 3.4–10.8)

## 2016-09-30 LAB — COMPREHENSIVE METABOLIC PANEL
ALT: 43 IU/L (ref 0–44)
AST: 18 IU/L (ref 0–40)
Albumin/Globulin Ratio: 1.7 (ref 1.2–2.2)
Albumin: 4.6 g/dL (ref 3.5–5.5)
Alkaline Phosphatase: 122 IU/L — ABNORMAL HIGH (ref 39–117)
BUN/Creatinine Ratio: 14 (ref 9–20)
BUN: 15 mg/dL (ref 6–20)
Bilirubin Total: 0.3 mg/dL (ref 0.0–1.2)
CO2: 23 mmol/L (ref 18–29)
Calcium: 9.3 mg/dL (ref 8.7–10.2)
Chloride: 104 mmol/L (ref 96–106)
Creatinine, Ser: 1.1 mg/dL (ref 0.76–1.27)
GFR calc Af Amer: 108 mL/min/{1.73_m2} (ref >59)
GFR calc non Af Amer: 93 mL/min/{1.73_m2} (ref >59)
Globulin, Total: 2.7 g/dL (ref 1.5–4.5)
Glucose: 88 mg/dL (ref 65–99)
Potassium: 4.6 mmol/L (ref 3.5–5.2)
Sodium: 144 mmol/L (ref 134–144)
Total Protein: 7.3 g/dL (ref 6.0–8.5)

## 2016-10-02 ENCOUNTER — Telehealth: Payer: Self-pay | Admitting: Neurology

## 2016-10-02 ENCOUNTER — Telehealth: Payer: Self-pay | Admitting: *Deleted

## 2016-10-02 NOTE — Telephone Encounter (Signed)
Called and gave VO for PT and OT as requested. She verbalized understanding. Nothing further needed at this time.

## 2016-10-02 NOTE — Telephone Encounter (Signed)
Betsy/[facility] [phone] request verbal order for PT: 1 x 1, 2 x 5 , 1 x 1. Also needs verbal order for OT

## 2016-10-02 NOTE — Telephone Encounter (Signed)
LMVM for mother or pt to return call for lab results.  (no DPR) not seen in media for FYI.

## 2016-10-02 NOTE — Telephone Encounter (Signed)
-----   Message from Butch Penny, NP sent at 10/01/2016  7:58 AM EDT ----- Lab work is relatively unremarkable. We are still waiting on JCV. Please call patient with results

## 2016-10-03 NOTE — Telephone Encounter (Signed)
LMVM for mother (VM) that pts labs unremarkable.  JCV pending.  She is to call back if questions.

## 2016-10-06 ENCOUNTER — Telehealth: Payer: Self-pay | Admitting: *Deleted

## 2016-10-06 NOTE — Telephone Encounter (Signed)
Received fax that JCV negative Index 0.07.

## 2016-10-06 NOTE — Telephone Encounter (Signed)
I called occupational therapist, gave verbal orders for occupational therapy.

## 2016-10-06 NOTE — Telephone Encounter (Signed)
Ok for times listed below?

## 2016-10-06 NOTE — Telephone Encounter (Signed)
Will with [facility] is calling to  get a verbal order for OT 1 time the first week and 2 times for 3 weeks.

## 2016-10-07 NOTE — Telephone Encounter (Signed)
Noted.  Please advise patient.

## 2016-10-08 NOTE — Telephone Encounter (Signed)
Spoke to pts mother and let her know that the JCV antibody test was negative.  (good).  She verbalized understanding.

## 2016-10-14 ENCOUNTER — Encounter: Payer: Self-pay | Admitting: *Deleted

## 2016-10-14 NOTE — Progress Notes (Signed)
Faxed signed orders by CW,MD back to [facility]. [id number], [id number], 1W1 OT effective 10/05/16 2RJ1 Fax: [phone]. Received confirmation.

## 2016-10-15 ENCOUNTER — Encounter (HOSPITAL_COMMUNITY): Payer: Self-pay

## 2016-10-15 ENCOUNTER — Encounter (HOSPITAL_COMMUNITY)
Admission: RE | Admit: 2016-10-15 | Discharge: 2016-10-15 | Disposition: A | Discharge status: Home / Self Care | Payer: Medicare Other | Source: Ambulatory Visit | Attending: Neurology | Admitting: Neurology

## 2016-10-15 DIAGNOSIS — G35 Multiple sclerosis: Secondary | ICD-10-CM | POA: Diagnosis not present

## 2016-10-15 MED ORDER — SODIUM CHLORIDE 0.9 % IV SOLN
Freq: Once | INTRAVENOUS | Status: AC
  Administered 2016-10-15: 13:00:00 via INTRAVENOUS

## 2016-10-15 MED ORDER — SODIUM CHLORIDE 0.9 % IV SOLN
300.0000 mg | INTRAVENOUS | Status: DC
  Administered 2016-10-15: 300 mg via INTRAVENOUS
  Filled 2016-10-15: qty 15

## 2016-10-15 MED ORDER — ACETAMINOPHEN 325 MG PO TABS
650.0000 mg | ORAL_TABLET | ORAL | Status: DC
  Administered 2016-10-15: 650 mg via ORAL
  Filled 2016-10-15: qty 2

## 2016-10-15 MED ORDER — LORATADINE 10 MG PO TABS
10.0000 mg | ORAL_TABLET | ORAL | Status: DC
  Administered 2016-10-15: 10 mg via ORAL
  Filled 2016-10-15: qty 1

## 2016-10-27 ENCOUNTER — Telehealth: Payer: Self-pay | Admitting: Neurology

## 2016-10-27 NOTE — Telephone Encounter (Signed)
Called and gave VO for speech evaluation. Betsy verbalized understanding and appreciation for call. Nothing further needed at this time.

## 2016-10-27 NOTE — Telephone Encounter (Signed)
Betsy/[facility] [phone] request VO speech therapy eval.

## 2016-11-05 ENCOUNTER — Telehealth: Payer: Self-pay | Admitting: *Deleted

## 2016-11-05 NOTE — Telephone Encounter (Signed)
Faxed signed orders by CW,MD back to [facility] re: speech therapy evaluation.  PT [id number], [id number] ST 4VQ2 Fax: [phone]. Received confirmation.

## 2016-11-07 ENCOUNTER — Telehealth: Payer: Self-pay | Admitting: *Deleted

## 2016-11-07 NOTE — Telephone Encounter (Signed)
Faxed signed orders by CW,MD to [facility] for speech therapy eval to address swallowing. Fax: [phone]. Received confirmation.

## 2016-11-12 ENCOUNTER — Encounter (HOSPITAL_COMMUNITY)
Admission: RE | Admit: 2016-11-12 | Discharge: 2016-11-12 | Disposition: A | Discharge status: Home / Self Care | Payer: Medicare Other | Source: Ambulatory Visit | Attending: Neurology | Admitting: Neurology

## 2016-11-12 ENCOUNTER — Encounter (HOSPITAL_COMMUNITY): Payer: Self-pay

## 2016-11-12 DIAGNOSIS — G35 Multiple sclerosis: Secondary | ICD-10-CM | POA: Diagnosis present

## 2016-11-12 MED ORDER — ACETAMINOPHEN 325 MG PO TABS
650.0000 mg | ORAL_TABLET | ORAL | Status: AC
  Administered 2016-11-12: 650 mg via ORAL
  Filled 2016-11-12: qty 2

## 2016-11-12 MED ORDER — SODIUM CHLORIDE 0.9 % IV SOLN
Freq: Once | INTRAVENOUS | Status: AC
  Administered 2016-11-12: 12:00:00 via INTRAVENOUS

## 2016-11-12 MED ORDER — LORATADINE 10 MG PO TABS
10.0000 mg | ORAL_TABLET | ORAL | Status: AC
  Administered 2016-11-12: 10 mg via ORAL
  Filled 2016-11-12: qty 1

## 2016-11-12 MED ORDER — SODIUM CHLORIDE 0.9 % IV SOLN
300.0000 mg | INTRAVENOUS | Status: DC
  Administered 2016-11-12: 300 mg via INTRAVENOUS
  Filled 2016-11-12: qty 15

## 2016-11-12 NOTE — Discharge Instructions (Signed)

## 2016-11-12 NOTE — Telephone Encounter (Signed)
Sandy with [facility] called stating patient was unable to do services today due to having an out patient procedure.  FYI

## 2016-11-12 NOTE — Telephone Encounter (Signed)
Noted, thank you  Dr. Willis- FYI 

## 2016-12-04 ENCOUNTER — Other Ambulatory Visit: Payer: Self-pay | Admitting: Neurology

## 2016-12-04 DIAGNOSIS — G35 Multiple sclerosis: Secondary | ICD-10-CM

## 2016-12-12 ENCOUNTER — Encounter (HOSPITAL_COMMUNITY): Payer: Self-pay

## 2016-12-12 ENCOUNTER — Encounter (HOSPITAL_COMMUNITY)
Admission: RE | Admit: 2016-12-12 | Discharge: 2016-12-12 | Disposition: A | Discharge status: Home / Self Care | Payer: Medicare Other | Source: Ambulatory Visit | Attending: Neurology | Admitting: Neurology

## 2016-12-12 DIAGNOSIS — G35 Multiple sclerosis: Secondary | ICD-10-CM | POA: Diagnosis not present

## 2016-12-12 MED ORDER — SODIUM CHLORIDE 0.9 % IV SOLN
INTRAVENOUS | Status: DC
  Administered 2016-12-12: 12:00:00 via INTRAVENOUS

## 2016-12-12 MED ORDER — ACETAMINOPHEN 325 MG PO TABS
650.0000 mg | ORAL_TABLET | ORAL | Status: DC
  Administered 2016-12-12: 650 mg via ORAL
  Filled 2016-12-12: qty 2

## 2016-12-12 MED ORDER — LORATADINE 10 MG PO TABS
10.0000 mg | ORAL_TABLET | ORAL | Status: DC
  Administered 2016-12-12: 10 mg via ORAL
  Filled 2016-12-12: qty 1

## 2016-12-12 MED ORDER — SODIUM CHLORIDE 0.9 % IV SOLN
300.0000 mg | INTRAVENOUS | Status: DC
  Administered 2016-12-12: 300 mg via INTRAVENOUS
  Filled 2016-12-12: qty 15

## 2016-12-12 NOTE — Progress Notes (Signed)
Pt had uneventful Tysabri infusion today. Only stayed 10 minutes post infusion (did have required NS flush after Tysabri completed) out of recommended hour observation. [name] said they had to leave at that time. Due to patient's family vacation schedule his next appointment will be five days later than the typical 28 day schedule.

## 2017-01-09 ENCOUNTER — Encounter (HOSPITAL_COMMUNITY): Payer: Medicare Other

## 2017-01-14 ENCOUNTER — Encounter (HOSPITAL_COMMUNITY): Admission: RE | Admit: 2017-01-14 | Payer: Medicare Other | Source: Ambulatory Visit

## 2017-02-06 ENCOUNTER — Encounter (HOSPITAL_COMMUNITY): Payer: Medicare Other

## 2017-02-10 ENCOUNTER — Encounter (HOSPITAL_COMMUNITY)
Admission: RE | Admit: 2017-02-10 | Discharge: 2017-02-10 | Disposition: A | Discharge status: Home / Self Care | Payer: Medicare Other | Source: Ambulatory Visit | Attending: Neurology | Admitting: Neurology

## 2017-02-10 ENCOUNTER — Encounter (HOSPITAL_COMMUNITY): Payer: Self-pay

## 2017-02-10 DIAGNOSIS — G35 Multiple sclerosis: Secondary | ICD-10-CM | POA: Insufficient documentation

## 2017-02-10 MED ORDER — LORATADINE 10 MG PO TABS
10.0000 mg | ORAL_TABLET | ORAL | Status: DC
  Administered 2017-02-10: 10 mg via ORAL
  Filled 2017-02-10: qty 1

## 2017-02-10 MED ORDER — SODIUM CHLORIDE 0.9 % IV SOLN
INTRAVENOUS | Status: DC
  Administered 2017-02-10: 13:00:00 via INTRAVENOUS

## 2017-02-10 MED ORDER — ACETAMINOPHEN 325 MG PO TABS
650.0000 mg | ORAL_TABLET | ORAL | Status: DC
  Administered 2017-02-10: 650 mg via ORAL
  Filled 2017-02-10: qty 2

## 2017-02-10 MED ORDER — SODIUM CHLORIDE 0.9 % IV SOLN
300.0000 mg | INTRAVENOUS | Status: DC
  Administered 2017-02-10: 300 mg via INTRAVENOUS
  Filled 2017-02-10: qty 15

## 2017-02-10 NOTE — Discharge Instructions (Signed)

## 2017-02-13 ENCOUNTER — Encounter (HOSPITAL_COMMUNITY): Admission: RE | Admit: 2017-02-13 | Payer: Medicare Other | Source: Ambulatory Visit

## 2017-03-10 ENCOUNTER — Telehealth: Payer: Self-pay | Admitting: *Deleted

## 2017-03-10 NOTE — Telephone Encounter (Signed)
Faxed completed/signed Tysabri pt status report and reauth questionnaire to [facility] at [phone].

## 2017-03-12 ENCOUNTER — Other Ambulatory Visit: Payer: Self-pay | Admitting: Neurology

## 2017-03-12 DIAGNOSIS — G35 Multiple sclerosis: Secondary | ICD-10-CM

## 2017-03-13 ENCOUNTER — Encounter (HOSPITAL_COMMUNITY): Payer: Medicare Other

## 2017-03-18 ENCOUNTER — Encounter (HOSPITAL_COMMUNITY)
Admission: RE | Admit: 2017-03-18 | Discharge: 2017-03-18 | Disposition: A | Discharge status: Home / Self Care | Payer: Medicare Other | Source: Ambulatory Visit | Attending: Neurology | Admitting: Neurology

## 2017-03-18 DIAGNOSIS — G35 Multiple sclerosis: Secondary | ICD-10-CM | POA: Insufficient documentation

## 2017-03-29 ENCOUNTER — Encounter (HOSPITAL_COMMUNITY): Payer: Self-pay

## 2017-03-29 DIAGNOSIS — I10 Essential (primary) hypertension: Secondary | ICD-10-CM | POA: Diagnosis not present

## 2017-03-29 DIAGNOSIS — F909 Attention-deficit hyperactivity disorder, unspecified type: Secondary | ICD-10-CM | POA: Insufficient documentation

## 2017-03-29 DIAGNOSIS — Z79899 Other long term (current) drug therapy: Secondary | ICD-10-CM | POA: Diagnosis not present

## 2017-03-29 DIAGNOSIS — G4489 Other headache syndrome: Secondary | ICD-10-CM | POA: Insufficient documentation

## 2017-03-29 DIAGNOSIS — F17210 Nicotine dependence, cigarettes, uncomplicated: Secondary | ICD-10-CM | POA: Diagnosis not present

## 2017-03-29 DIAGNOSIS — R072 Precordial pain: Secondary | ICD-10-CM | POA: Insufficient documentation

## 2017-03-29 DIAGNOSIS — R51 Headache: Secondary | ICD-10-CM | POA: Diagnosis present

## 2017-03-29 NOTE — ED Triage Notes (Signed)
To triage via EMS.  Onset 2 hours PTA headache.  Pt thinks headache started after walking long way.  Pt took Advil.

## 2017-03-30 ENCOUNTER — Emergency Department (HOSPITAL_COMMUNITY)
Admission: EM | Admit: 2017-03-30 | Discharge: 2017-03-30 | Disposition: A | Discharge status: Home / Self Care | Payer: Medicare Other | Attending: Emergency Medicine | Admitting: Emergency Medicine

## 2017-03-30 ENCOUNTER — Emergency Department (HOSPITAL_COMMUNITY): Payer: Medicare Other

## 2017-03-30 DIAGNOSIS — G4489 Other headache syndrome: Secondary | ICD-10-CM | POA: Diagnosis not present

## 2017-03-30 DIAGNOSIS — R072 Precordial pain: Secondary | ICD-10-CM

## 2017-03-30 MED ORDER — IBUPROFEN 800 MG PO TABS
800.0000 mg | ORAL_TABLET | Freq: Once | ORAL | Status: AC
  Administered 2017-03-30: 800 mg via ORAL
  Filled 2017-03-30: qty 1

## 2017-03-30 MED ORDER — HYDROCODONE-ACETAMINOPHEN 5-325 MG PO TABS
1.0000 | ORAL_TABLET | Freq: Once | ORAL | Status: AC
  Administered 2017-03-30: 1 via ORAL
  Filled 2017-03-30: qty 1

## 2017-03-30 NOTE — ED Notes (Signed)
Pt ambulated to BR with steady gait. Pt denies pain at this time. Pt provided sandwich and beverage

## 2017-03-30 NOTE — Discharge Instructions (Signed)
You are having a headache. No specific cause was found today for your headache. It may have been a migraine or other cause of headache. Stress, anxiety, fatigue, and depression are common triggers for headaches. Your headache today does not appear to be life-threatening or require hospitalization, but often the exact cause of headaches is not determined in the emergency department. Therefore, follow-up with your doctor is very important to find out what may have caused your headache, and whether or not you need any further diagnostic testing or treatment. Sometimes headaches can appear benign (not harmful), but then more serious symptoms can develop which should prompt an immediate re-evaluation by your doctor or the emergency department.  SEEK MEDICAL ATTENTION IF:  You develop possible problems with medications prescribed.  The medications don't resolve your headache, if it recurs , or if you have multiple episodes of vomiting or can't take fluids. You have a change from the usual headache.  RETURN IMMEDIATELY IF you develop a sudden, severe headache or confusion, become poorly responsive or faint, develop a fever above 100.57F or problem breathing, have a change in speech, vision, swallowing, or understanding, or develop new weakness, numbness, tingling, incoordination, or have a seizure.   Your caregiver has diagnosed you as having chest pain that is not specific for one problem, but does not require admission.  Chest pain comes from many different causes.  SEEK IMMEDIATE MEDICAL ATTENTION IF: You have severe chest pain, especially if the pain is crushing or pressure-like and spreads to the arms, back, neck, or jaw, or if you have sweating, nausea (feeling sick to your stomach), or shortness of breath. THIS IS AN EMERGENCY. Don't wait to see if the pain will go away. Get medical help at once. Call 911 or 0 (operator). DO NOT drive yourself to the hospital.  Your chest pain gets worse and does not go  away with rest.  You have an attack of chest pain lasting longer than usual, despite rest and treatment with the medications your caregiver has prescribed.  You wake from sleep with chest pain or shortness of breath.  You feel dizzy or faint.  You have chest pain not typical of your usual pain for which you originally saw your caregiver.

## 2017-03-30 NOTE — ED Notes (Signed)
Repeat EKG done, given to Dr. Wickline 

## 2017-03-30 NOTE — ED Provider Notes (Signed)
[facility]-EMERGENCY DEPT Provider Note   CSN: [id number] Arrival date & time: 03/29/17  2318     History   Chief Complaint Chief Complaint  Patient presents with  . Headache    HPI Brandon [name] is a 26 y.o. male.  The history is provided by the patient.  Headache   This is a new problem. The current episode started 3 to 5 hours ago. The problem occurs constantly. The problem has not changed since onset.Associated with: watching TV. The pain is located in the left unilateral region. The pain is moderate. The pain does not radiate. Pertinent negatives include no fever, no syncope, no shortness of breath and no vomiting. He has tried nothing for the symptoms.  Chest Pain   This is a new problem. The current episode started 1 to 2 hours ago. The problem occurs constantly. The problem has not changed since onset.The pain is mild. Associated symptoms include headaches. Pertinent negatives include no fever, no shortness of breath, no syncope, no vomiting and no weakness. He has tried nothing for the symptoms. Risk factors include male gender and obesity.  His past medical history is significant for hypertension.  Pertinent negatives for past medical history include no CAD and no PE.  Patient presents with HA and Chest pain He reports onset of HA about 4 hrs PTA while watching TV Left sided HA, gradual in onset Denies trauma No focal weakness No visual changes No tick bites/rashes  He also reports onset of chest pain soon after the HA It is mild, no associated SOB He reports "moving around" in bed worsens the pain   Past Medical History:  Diagnosis Date  . ADHD (attention deficit hyperactivity disorder)   . Bipolar 1 disorder (HCC)   . Chronic back pain   . Chronic neck pain   . Hypertension   . Multiple sclerosis (HCC) 05/20/2013   left sided weakness, dysarthria  . Non-compliance   . Obesity   . Schizophrenia (HCC)   . White matter abnormality on MRI of brain 11/23/2012     Patient Active Problem List   Diagnosis Date Noted  . Schizoaffective disorder (HCC)   . Hallucinations 08/05/2014  . Auditory hallucination   . Homicidal ideation   . Schizoaffective disorder, unspecified type (HCC)   . Left-sided weakness 01/18/2014  . Multiple sclerosis (HCC) 05/20/2013  . 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 Abnormality of gait 11/23/2012  . HTN (hypertension) 07/04/2011  . 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 OBESITY 01/14/2010  . ADHD 01/14/2010    Past Surgical History:  Procedure Laterality Date  . None         Home Medications    Prior to Admission medications   Medication Sig Start Date End Date Taking? Authorizing Provider  ALPRAZolam Prudy Feeler) 0.5 MG tablet Take 2 tablets approximately 45 minutes prior to the MRI study, take a third tablet if needed. 03/28/16   York Spaniel, MD  amLODipine (NORVASC) 10 MG tablet Take 10 mg by mouth daily.    [provider]  benztropine (COGENTIN) 1 MG tablet Take 1 tablet (1 mg total) by mouth 2 (two) times daily. Patient taking differently: Take 1 mg by mouth daily.  04/02/15   Charm Rings, NP  cloZAPine (CLOZARIL) 100 MG tablet Take 100 mg by mouth. 1 tablet in the morning, Currently taking 1 & 1/2 tablet at night    [provider]  docusate sodium (COLACE) 100 MG capsule Take 100 mg  by mouth 2 (two) times daily.     [provider]  INVEGA SUSTENNA 234 MG/1.5ML SUSP injection Inject 234 mg as directed every 21 ( twenty-one) days. 05/16/15   [provider]  Melatonin 5 MG TABS Take 10 mg by mouth at bedtime.     [provider]  OLANZapine zydis (ZYPREXA) 10 MG disintegrating tablet Take 10 mg by mouth at bedtime.    [provider]  paliperidone (INVEGA) 6 MG 24 hr tablet Take 6 mg by mouth daily.    [provider]  propranolol (INDERAL) 10 MG tablet Take 1 tablet (10 mg total) by mouth 2 (two) times daily. 05/03/15   Charm Rings,  NP  traZODone (DESYREL) 100 MG tablet Take 200 mg by mouth at bedtime as needed for sleep.  05/14/15   [provider]    Family History Family History  Problem Relation Age of Onset  . Diabetes Mother   . ADD / ADHD Brother     Social History Social History  Substance Use Topics  . Smoking status: Current Every Day Smoker    Packs/day: 0.25    Types: Cigarettes  . Smokeless tobacco: Never Used  . Alcohol use 0.0 oz/week     Comment: "A little bit"      Allergies   Patient has no known allergies.   Review of Systems Review of Systems  Constitutional: Negative for fever.  Respiratory: Negative for shortness of breath.   Cardiovascular: Positive for chest pain. Negative for syncope.  Gastrointestinal: Negative for vomiting.  Neurological: Positive for headaches. Negative for weakness.  All other systems reviewed and are negative.    Physical Exam Updated Vital Signs BP (!) 158/96   Pulse 92   Temp 97.9 F (36.6 C) (Oral)   Resp 20   SpO2 98%   Physical Exam CONSTITUTIONAL: Well developed/well nourished, listening to music HEAD: Normocephalic/atraumatic EYES: EOMI/PERRL, no nystagmus, no ptosis ENMT: Mucous membranes moist NECK: supple no meningeal signs, no bruits SPINE/BACK:entire spine nontender CV: S1/S2 noted, no murmurs/rubs/gallops noted LUNGS: Lungs are clear to auscultation bilaterally, no apparent distress ABDOMEN: soft, nontender, no rebound or guarding GU:no cva tenderness NEURO:Awake/alert, face symmetric, no arm or leg drift is noted Equal 5/5 strength with shoulder abduction, elbow flex/extension, wrist flex/extension in upper extremities and equal hand grips bilaterally Equal 5/5 strength with hip flexion,knee flex/extension, foot dorsi/plantar flexion Cranial nerves 3/4/5/6/01/12/09/11/12 tested and intact Gait normal without ataxia No past pointing Sensation to light touch intact in all extremities EXTREMITIES: pulses normal, full  ROM SKIN: warm, color normal PSYCH: flat affect  ED Treatments / Results  Labs (all labs ordered are listed, but only abnormal results are displayed) Labs Reviewed - No data to display  EKG  EKG Interpretation  Date/Time:  Monday March 30 2017 05:02:45 EDT Ventricular Rate:  71 PR Interval:    QRS Duration: 100 QT Interval:  373 QTC Calculation: 406 R Axis:   23 Text Interpretation:  Sinus rhythm Low voltage, precordial leads Confirmed by Zadie Rhine ([id number]) on 03/30/2017 5:32:09 AM       Radiology Dg Chest 2 View  Result Date: 03/30/2017 CLINICAL DATA:  Left-sided chest pain tonight. EXAM: CHEST  2 VIEW COMPARISON:  Radiographs 01/03/2016 FINDINGS: The cardiomediastinal contours are normal. The lungs are clear. Pulmonary vasculature is normal. No consolidation, pleural effusion, or pneumothorax. No acute osseous abnormalities are seen. IMPRESSION: No acute abnormality. Electronically Signed   By: Rubye Oaks M.D.   On:  03/30/2017 04:57    Procedures Procedures (including critical care time)  Medications Ordered in ED Medications  ibuprofen (ADVIL,MOTRIN) tablet 800 mg (800 mg Oral Given 03/30/17 0435)  HYDROcodone-acetaminophen (NORCO/VICODIN) 5-325 MG per tablet 1 tablet (1 tablet Oral Given 03/30/17 0435)     Initial Impression / Assessment and Plan / ED Course  I have reviewed the triage vital signs and the nursing notes.  Pertinent imaging results that were available during my care of the patient were reviewed by me and considered in my medical decision making (see chart for details).     4:25 AM Pt well appearing Listening to music, no distress Will treat HA with meds As for CP - will check CXR/EKG He has h/o MS but "reports is going away" No known h/o CAD/PE or stroke Pt with h/o schizophrenia, but this appears stable    Pt stable Resting comfortably He had been asking for sandwiches from nursing I doubt acute neurologic emergency at this  time  Final Clinical Impressions(s) / ED Diagnoses   Final diagnoses:  Other headache syndrome  Precordial pain    New Prescriptions New Prescriptions   No medications on file     Zadie Rhine, MD 03/30/17 [phone]

## 2017-03-30 NOTE — ED Notes (Signed)
Pt to xray via stretcher

## 2017-04-03 ENCOUNTER — Ambulatory Visit (INDEPENDENT_AMBULATORY_CARE_PROVIDER_SITE_OTHER): Payer: Medicare Other | Admitting: Neurology

## 2017-04-03 ENCOUNTER — Encounter: Payer: Self-pay | Admitting: Neurology

## 2017-04-03 VITALS — BP 138/89 | HR 75 | Wt 360.5 lb

## 2017-04-03 DIAGNOSIS — Z5181 Encounter for therapeutic drug level monitoring: Secondary | ICD-10-CM

## 2017-04-03 DIAGNOSIS — G35 Multiple sclerosis: Secondary | ICD-10-CM

## 2017-04-03 MED ORDER — ALPRAZOLAM 0.5 MG PO TABS: ORAL_TABLET | ORAL | 0 refills | Status: DC

## 2017-04-03 NOTE — Patient Instructions (Signed)
   We will repeat MRI of the brain.

## 2017-04-03 NOTE — Progress Notes (Signed)
Reason for visit: Multiple sclerosis  Brandon [name] is an 26 y.o. male  History of present illness:  [name] is a 26 year old right-handed white male with a history of multiple sclerosis. The patient has schizophrenia, he is on antipsychotic medications including Clozaril. The patient missed his Tysabri dose on 03/17/2017 as he refused to come in to get the infusion. The patient has had some slight increase in dragging of the left leg with walking. Otherwise, he has not had any significant change in his functional level. He denies any falls, he denies any vision changes or difficulty controlling the bowels or the bladder. He does have a left hemiparesis that is chronic. He does not use a cane or a walker for ambulation. He finds that he is sleeping well. He returns for an evaluation.   Past Medical History:  Diagnosis Date  . ADHD (attention deficit hyperactivity disorder)   . Bipolar 1 disorder (HCC)   . Chronic back pain   . Chronic neck pain   . Hypertension   . Multiple sclerosis (HCC) 05/20/2013   left sided weakness, dysarthria  . Non-compliance   . Obesity   . Schizophrenia (HCC)   . White matter abnormality on MRI of brain 11/23/2012    Past Surgical History:  Procedure Laterality Date  . None      Family History  Problem Relation Age of Onset  . Diabetes Mother   . ADD / ADHD Brother     Social history:  reports that he has been smoking Cigarettes.  He has been smoking about 0.25 packs per day. He has never used smokeless tobacco. He reports that he drinks alcohol. He reports that he uses drugs, including Marijuana.   No Known Allergies  Medications:  Prior to Admission medications   Medication Sig Start Date End Date Taking? Authorizing Provider  ALPRAZolam Prudy Feeler) 0.5 MG tablet Take 2 tablets approximately 45 minutes prior to the MRI study, take a third tablet if needed. 03/28/16  Yes York Spaniel, MD  amLODipine (NORVASC) 10 MG tablet Take 10 mg by  mouth daily.   Yes [provider]  benztropine (COGENTIN) 1 MG tablet Take 1 tablet (1 mg total) by mouth 2 (two) times daily. Patient taking differently: Take 1 mg by mouth daily.  04/02/15  Yes Charm Rings, NP  cloZAPine (CLOZARIL) 100 MG tablet Take 100 mg by mouth. 1 tablet in the morning, Currently taking 1 & 1/2 tablet at night   Yes [provider]  docusate sodium (COLACE) 100 MG capsule Take 100 mg by mouth 2 (two) times daily.    Yes [provider]  INVEGA SUSTENNA 234 MG/1.5ML SUSP injection Inject 234 mg as directed every 21 ( twenty-one) days. 05/16/15  Yes [provider]  Melatonin 5 MG TABS Take 10 mg by mouth at bedtime.    Yes [provider]  OLANZapine zydis (ZYPREXA) 10 MG disintegrating tablet Take 10 mg by mouth at bedtime.   Yes [provider]  paliperidone (INVEGA) 6 MG 24 hr tablet Take 6 mg by mouth daily.   Yes [provider]  propranolol (INDERAL) 10 MG tablet Take 1 tablet (10 mg total) by mouth 2 (two) times daily. 05/03/15  Yes Charm Rings, NP  traZODone (DESYREL) 100 MG tablet Take 200 mg by mouth at bedtime as needed for sleep.  05/14/15  Yes [provider]    ROS:  Out of a complete 14 system review  of symptoms, the patient complains only of the following symptoms, and all other reviewed systems are negative.  Increased appetite Snoring, acting out dreams Behavior problem, hallucinations  Blood pressure 138/89, pulse 75, weight (!) 360 lb 8 oz (163.5 kg).  Physical Exam  General: The patient is alert and cooperative at the time of the examination. The patient is markedly obese.  Skin: No significant peripheral edema is noted.   Neurologic Exam  Mental status: The patient is alert and oriented x 3 at the time of the examination. The patient has apparent normal recent and remote memory, with an apparently normal attention span and concentration ability.   Cranial  nerves: Facial symmetry is present. Speech is normal, no aphasia or dysarthria is noted. Extraocular movements are full. Visual fields are full. Pupils are equal, round, and reactive to light. Discs are flat bilaterally.  Motor: The patient has good strength in the right arm and leg. On the left side there is 4/5 strength with the left arm, the patient has significant weakness with hip flexion on the left with 3/5 strength and has a left footdrop.  Sensory examination: Soft touch sensation is symmetric on the face, arms, and legs.  Coordination: The patient has good finger-nose-finger and heel-to-shin bilaterally.  Gait and station: The patient has a wide-based, unsteady gait. The patient has a circumduction type gait with the left leg. Tandem gait was not attempted. Romberg is negative.  Reflexes: Deep tendon reflexes are symmetric.   MRI brain 05/12/16:  IMPRESSION:  This MRI of the brain with and without contrast shows the following: 1.   Multiple supratentorial and a couple infratentorial T2/FLAIR hyperintense foci consistent with chronic demyelinating plaque associated with multiple sclerosis. None of the foci appears to be acute.  2.   When compared to the prior MRI dated 03/23/2015, there is no interval change. 3.   There is mild cortical atrophy. 4.    There are no acute findings. There is a normal enhancement pattern.  * MRI scan images were reviewed online. I agree with the written report.    Assessment/Plan:  1. Multiple sclerosis  2. Gait disorder  3. Schizophrenia  I indicated that the patient should not miss doses of Tysabri as he will be at risk for a significant MS exacerbation. The patient will undergo blood work today, he will get MRI evaluation of the brain. The patient gets weekly CBC evaluations as he is on Clozaril. He will follow-up in 6 months. A prescription was given for alprazolam for the MRI evaluation.   Marlan Palau MD 04/03/2017 10:31  AM  [facility] [address] [place], [place] [address]  Phone [phone] Fax [phone]

## 2017-04-04 LAB — CBC WITH DIFFERENTIAL/PLATELET
Basophils Absolute: 0 10*3/uL (ref 0.0–0.2)
Basos: 0 %
EOS (ABSOLUTE): 0.5 10*3/uL — ABNORMAL HIGH (ref 0.0–0.4)
Eos: 7 %
Hematocrit: 39.2 % (ref 37.5–51.0)
Hemoglobin: 12.5 g/dL — ABNORMAL LOW (ref 13.0–17.7)
Immature Grans (Abs): 0 10*3/uL (ref 0.0–0.1)
Immature Granulocytes: 0 %
Lymphocytes Absolute: 2.1 10*3/uL (ref 0.7–3.1)
Lymphs: 28 %
MCH: 25 pg — ABNORMAL LOW (ref 26.6–33.0)
MCHC: 31.9 g/dL (ref 31.5–35.7)
MCV: 78 fL — ABNORMAL LOW (ref 79–97)
Monocytes Absolute: 0.6 10*3/uL (ref 0.1–0.9)
Monocytes: 7 %
Neutrophils Absolute: 4.3 10*3/uL (ref 1.4–7.0)
Neutrophils: 58 %
Platelets: 189 10*3/uL (ref 150–379)
RBC: 5.01 x10E6/uL (ref 4.14–5.80)
RDW: 15.5 % — ABNORMAL HIGH (ref 12.3–15.4)
WBC: 7.4 10*3/uL (ref 3.4–10.8)

## 2017-04-04 LAB — COMPREHENSIVE METABOLIC PANEL
ALT: 33 IU/L (ref 0–44)
AST: 17 IU/L (ref 0–40)
Albumin/Globulin Ratio: 1.6 (ref 1.2–2.2)
Albumin: 4.3 g/dL (ref 3.5–5.5)
Alkaline Phosphatase: 109 IU/L (ref 39–117)
BUN/Creatinine Ratio: 13 (ref 9–20)
BUN: 13 mg/dL (ref 6–20)
Bilirubin Total: <0.2 mg/dL (ref 0.0–1.2)
CO2: 22 mmol/L (ref 20–29)
Calcium: 9.2 mg/dL (ref 8.7–10.2)
Chloride: 103 mmol/L (ref 96–106)
Creatinine, Ser: 0.97 mg/dL (ref 0.76–1.27)
GFR calc Af Amer: 125 mL/min/{1.73_m2} (ref >59)
GFR calc non Af Amer: 108 mL/min/{1.73_m2} (ref >59)
Globulin, Total: 2.7 g/dL (ref 1.5–4.5)
Glucose: 92 mg/dL (ref 65–99)
Potassium: 4.6 mmol/L (ref 3.5–5.2)
Sodium: 140 mmol/L (ref 134–144)
Total Protein: 7 g/dL (ref 6.0–8.5)

## 2017-04-09 ENCOUNTER — Telehealth: Payer: Self-pay | Admitting: Neurology

## 2017-04-09 NOTE — Telephone Encounter (Signed)
JC viral antibody was negative, index of 0.07

## 2017-04-10 ENCOUNTER — Encounter (HOSPITAL_COMMUNITY): Payer: Medicare Other

## 2017-04-17 ENCOUNTER — Encounter (HOSPITAL_COMMUNITY)
Admission: RE | Admit: 2017-04-17 | Discharge: 2017-04-17 | Disposition: A | Discharge status: Home / Self Care | Payer: Medicare Other | Source: Ambulatory Visit | Attending: Neurology | Admitting: Neurology

## 2017-04-17 ENCOUNTER — Encounter (HOSPITAL_COMMUNITY): Payer: Self-pay

## 2017-04-17 DIAGNOSIS — G35 Multiple sclerosis: Secondary | ICD-10-CM | POA: Diagnosis not present

## 2017-04-17 MED ORDER — SODIUM CHLORIDE 0.9 % IV SOLN
300.0000 mg | INTRAVENOUS | Status: DC
  Administered 2017-04-17: 300 mg via INTRAVENOUS
  Filled 2017-04-17: qty 15

## 2017-04-17 MED ORDER — ACETAMINOPHEN 325 MG PO TABS
650.0000 mg | ORAL_TABLET | ORAL | Status: DC
  Administered 2017-04-17: 650 mg via ORAL
  Filled 2017-04-17: qty 2

## 2017-04-17 MED ORDER — LORATADINE 10 MG PO TABS
10.0000 mg | ORAL_TABLET | ORAL | Status: DC
Start: 2017-04-17 — End: 2017-04-18
  Administered 2017-04-17: 10 mg via ORAL
  Filled 2017-04-17: qty 1

## 2017-04-17 MED ORDER — SODIUM CHLORIDE 0.9 % IV SOLN
INTRAVENOUS | Status: DC
Start: 2017-04-17 — End: 2017-04-18
  Administered 2017-04-17: 12:00:00 via INTRAVENOUS

## 2017-04-17 NOTE — Progress Notes (Signed)
Patient here for Tysabri infusion. Tolerated well. Only stayed for 15 minutes of NS flush post infusion out of recommended time. Mom stated they had to catch the bus and needed to go. BP high today (166/100) and patient's mother stated he was refusing to take his bp meds at home. Aware of next appointment.

## 2017-04-24 ENCOUNTER — Other Ambulatory Visit: Payer: Self-pay

## 2017-05-11 ENCOUNTER — Telehealth: Payer: Self-pay | Admitting: Neurology

## 2017-05-11 ENCOUNTER — Ambulatory Visit
Admission: RE | Admit: 2017-05-11 | Discharge: 2017-05-11 | Disposition: A | Discharge status: Home / Self Care | Payer: Medicare Other | Source: Ambulatory Visit | Attending: Neurology | Admitting: Neurology

## 2017-05-11 DIAGNOSIS — G35 Multiple sclerosis: Secondary | ICD-10-CM

## 2017-05-11 MED ORDER — GADOBENATE DIMEGLUMINE 529 MG/ML IV SOLN
20.0000 mL | Freq: Once | INTRAVENOUS | Status: AC | PRN
  Administered 2017-05-11: 20 mL via INTRAVENOUS

## 2017-05-11 NOTE — Telephone Encounter (Signed)
I called the patient.  I left a message for the mother.  The MRI of the brain does show some disease activity, changes from November 2017.  Patient on no missed a dose in September, he sometimes refused to get the medication.  If he has missed several doses throughout the year, this may explain some of the disease activity.  If not, we may need to consider other therapy such as Lemtrada or Ocrevus.  The mother is to contact our office.   MRI brain 05/11/17:  IMPRESSION:  This MRI of the brain with and without contrast shows the following: 1.     Multiple T2/FLAIR hyperintense foci in the upper spinal cord, cerebellum, brainstem and in the periventricular, juxtacortical and deep white matter of both hemispheres in a pattern and configuration consistent with demyelinating plaques associated with multiple sclerosis. Some foci appear to be acute to subacute with enhancement and/or hyperintensity on diffusion-weighted images.  2.    Compared to the MRI dated 05/12/2016, some of the foci appeared to have developed during the interim including a large left parietal plaque.

## 2017-05-15 ENCOUNTER — Encounter (HOSPITAL_COMMUNITY): Payer: Self-pay

## 2017-05-15 ENCOUNTER — Encounter (HOSPITAL_COMMUNITY)
Admission: RE | Admit: 2017-05-15 | Discharge: 2017-05-15 | Disposition: A | Discharge status: Home / Self Care | Payer: Medicare Other | Source: Ambulatory Visit | Attending: Neurology | Admitting: Neurology

## 2017-05-15 ENCOUNTER — Telehealth: Payer: Self-pay | Admitting: Neurology

## 2017-05-15 DIAGNOSIS — G35 Multiple sclerosis: Secondary | ICD-10-CM | POA: Insufficient documentation

## 2017-05-15 MED ORDER — SODIUM CHLORIDE 0.9 % IV SOLN
INTRAVENOUS | Status: DC
  Administered 2017-05-15: 12:00:00 via INTRAVENOUS

## 2017-05-15 MED ORDER — SODIUM CHLORIDE 0.9 % IV SOLN
300.0000 mg | INTRAVENOUS | Status: DC
  Administered 2017-05-15: 300 mg via INTRAVENOUS
  Filled 2017-05-15: qty 15

## 2017-05-15 MED ORDER — LORATADINE 10 MG PO TABS
10.0000 mg | ORAL_TABLET | ORAL | Status: DC
  Administered 2017-05-15: 10 mg via ORAL
  Filled 2017-05-15: qty 1

## 2017-05-15 MED ORDER — ACETAMINOPHEN 325 MG PO TABS
650.0000 mg | ORAL_TABLET | ORAL | Status: DC
  Administered 2017-05-15: 650 mg via ORAL
  Filled 2017-05-15: qty 2

## 2017-05-15 NOTE — Discharge Instructions (Signed)
Natalizumab injection/Tysabri Infusion  °What is this medicine? °NATALIZUMAB (na ta LIZ you mab) is used to treat relapsing multiple sclerosis. This drug is not a cure. It is also used to treat Crohn's disease. °This medicine may be used for other purposes; ask your health care provider or pharmacist if you have questions. °COMMON BRAND NAME(S): Tysabri °What should I tell my health care provider before I take this medicine? °They need to know if you have any of these conditions: °-immune system problems °-progressive multifocal leukoencephalopathy (PML) °-an unusual or allergic reaction to natalizumab, other medicines, foods, dyes, or preservatives °-pregnant or trying to get pregnant °-breast-feeding °How should I use this medicine? °This medicine is for infusion into a vein. It is given by a health care professional in a hospital or clinic setting. °A special MedGuide will be given to you by the pharmacist with each prescription and refill. Be sure to read this information carefully each time. °Talk to your pediatrician regarding the use of this medicine in children. This medicine is not approved for use in children. °Overdosage: If you think you have taken too much of this medicine contact a poison control center or emergency room at once. °NOTE: This medicine is only for you. Do not share this medicine with others. °What if I miss a dose? °It is important not to miss your dose. Call your doctor or health care professional if you are unable to keep an appointment. °What may interact with this medicine? °-azathioprine °-cyclosporine °-interferon °-6-mercaptopurine °-methotrexate °-steroid medicines like prednisone or cortisone °-TNF-alpha inhibitors like adalimumab, etanercept, and infliximab °-vaccines °This list may not describe all possible interactions. Give your health care provider a list of all the medicines, herbs, non-prescription drugs, or dietary supplements you use. Also tell them if you smoke, drink  alcohol, or use illegal drugs. Some items may interact with your medicine. °What should I watch for while using this medicine? °Your condition will be monitored carefully while you are receiving this medicine. Visit your doctor for regular check ups. Tell your doctor or healthcare professional if your symptoms do not start to get better or if they get worse. °Stay away from people who are sick. Call your doctor or health care professional for advice if you get a fever, chills or sore throat, or other symptoms of a cold or flu. Do not treat yourself. °In some patients, this medicine may cause a serious brain infection that may cause death. If you have any problems seeing, thinking, speaking, walking, or standing, tell your doctor right away. If you cannot reach your doctor, get urgent medical care. °What side effects may I notice from receiving this medicine? °Side effects that you should report to your doctor or health care professional as soon as possible: °-allergic reactions like skin rash, itching or hives, swelling of the face, lips, or tongue °-breathing problems °-changes in vision °-chest pain °-dark urine °-depression, feelings of sadness °-dizziness °-general ill feeling or flu-like symptoms °-irregular, missed, or painful menstrual periods °-light-colored stools °-loss of appetite, nausea °-muscle weakness °-problems with balance, talking, or walking °-right upper belly pain °-unusually weak or tired °-yellowing of the eyes or skin °Side effects that usually do not require medical attention (report to your doctor or health care professional if they continue or are bothersome): °-aches, pains °-headache °-stomach upset °-tiredness °This list may not describe all possible side effects. Call your doctor for medical advice about side effects. You may report side effects to FDA at 1-800-FDA-1088. °Where should   I keep my medicine? °This drug is given in a hospital or clinic and will not be stored at  home. °NOTE: This sheet is a summary. It may not cover all possible information. If you have questions about this medicine, talk to your doctor, pharmacist, or health care provider. °© 2018 Elsevier/Gold Standard (2008-08-12 13:33:21) ° °

## 2017-05-15 NOTE — Telephone Encounter (Signed)
I called the mother again, left a message again, she is to call our office to review his treatment with Tysabri.  Need to figure out how many doses of Tysabri and he has missed.  If he has been fairly compliant, we may need to consider another medication such as Ocrevus for treatment for MS.

## 2017-05-15 NOTE — Progress Notes (Addendum)
Uneventful Tysabri infusion. Pt stayed approx 10 mins of the TOUCH recommended 1 hour post infusion observation time.  Pt and his mother aware to contact MD of any difficulties.

## 2017-05-22 ENCOUNTER — Telehealth: Payer: Self-pay | Admitting: Neurology

## 2017-05-22 NOTE — Telephone Encounter (Signed)
I called the mother, finally got to talk to her.  The MRI of the brain shows progression of the multiple sclerosis on Tysabri.  Patient apparently missed 2 recent doses of Tysabri back to back, he did not get Tysabri in August because he was in jail, he did Tysabri in September of her feet when for the infusion.  The patient went from July until October without any medication.  It may be reasonable to consider a switch to Ocrevus as the frequency of the infusions will be much less, twice a year.  I will get a repeat set him.

## 2017-05-22 NOTE — Telephone Encounter (Signed)
Called and scheduled appt for 06/05/17 at 1130am, check in 1100am.

## 2017-06-05 ENCOUNTER — Ambulatory Visit (INDEPENDENT_AMBULATORY_CARE_PROVIDER_SITE_OTHER): Payer: Medicare Other | Admitting: Neurology

## 2017-06-05 ENCOUNTER — Encounter: Payer: Self-pay | Admitting: Neurology

## 2017-06-05 VITALS — BP 154/84 | HR 94 | Ht 73.0 in | Wt 332.0 lb

## 2017-06-05 DIAGNOSIS — R269 Unspecified abnormalities of gait and mobility: Secondary | ICD-10-CM | POA: Diagnosis not present

## 2017-06-05 DIAGNOSIS — Z5181 Encounter for therapeutic drug level monitoring: Secondary | ICD-10-CM

## 2017-06-05 DIAGNOSIS — G35 Multiple sclerosis: Secondary | ICD-10-CM

## 2017-06-05 NOTE — Patient Instructions (Signed)
   We will stop the Tysabri and start Ocrevus for treatment of the MS.

## 2017-06-05 NOTE — Progress Notes (Signed)
Reason for visit: Multiple sclerosis  Brandon [name] is an 26 y.o. male  History of present illness:  Brandon [name] is a 26 year old right-handed white male with a history of multiple sclerosis.  The patient has schizophrenia and he has an associated gait disorder with the MS.  The patient will fall on occasion.  He has weakness primarily the left leg.  The patient has been on Tysabri, but he has missed at least 2 doses recently in August and September, he missed 1 dose because he was in jail and the other dose because he refused to come in to get the infusion.  The patient recently had MRI evaluation of the brain that shows progression of his multiple sclerosis with a fairly large new left parietal lesion.  The patient comes in as a work in today to discuss changing therapies for multiple sclerosis.  The reports no numbness of the extremities, he denies issues controlling the bowels of the bladder.  He has not had any new vision changes.  Past Medical History:  Diagnosis Date  . ADHD (attention deficit hyperactivity disorder)   . Bipolar 1 disorder (HCC)   . Chronic back pain   . Chronic neck pain   . Hypertension   . Multiple sclerosis (HCC) 05/20/2013   left sided weakness, dysarthria  . Non-compliance   . Obesity   . Schizophrenia (HCC)   . White matter abnormality on MRI of brain 11/23/2012    Past Surgical History:  Procedure Laterality Date  . None      Family History  Problem Relation Age of Onset  . Diabetes Mother   . ADD / ADHD Brother     Social history:  reports that he has been smoking cigarettes.  He has been smoking about 0.25 packs per day. he has never used smokeless tobacco. He reports that he drinks alcohol. He reports that he uses drugs. Drug: Marijuana.   No Known Allergies  Medications:  Prior to Admission medications   Medication Sig Start Date End Date Taking? Authorizing Provider  amLODipine (NORVASC) 10 MG tablet Take 10 mg by mouth daily.   Yes  [provider]  benztropine (COGENTIN) 1 MG tablet Take 1 tablet (1 mg total) by mouth 2 (two) times daily. Patient taking differently: Take 1 mg by mouth daily.  04/02/15  Yes Charm Rings, NP  cloZAPine (CLOZARIL) 100 MG tablet Take 100 mg by mouth. 1 tablet in the morning, Currently taking 1 & 1/2 tablet at night   Yes [provider]  docusate sodium (COLACE) 100 MG capsule Take 100 mg by mouth 2 (two) times daily.    Yes [provider]  INVEGA SUSTENNA 234 MG/1.5ML SUSP injection Inject 234 mg into the muscle every 30 (thirty) days.  05/15/17  Yes [provider]  Melatonin 5 MG TABS Take 10 mg by mouth at bedtime.    Yes [provider]  OLANZapine zydis (ZYPREXA) 10 MG disintegrating tablet Take 10 mg by mouth at bedtime.   Yes [provider]  propranolol (INDERAL) 10 MG tablet Take 1 tablet (10 mg total) by mouth 2 (two) times daily. 05/03/15  Yes Charm Rings, NP  traZODone (DESYREL) 100 MG tablet Take 200 mg by mouth at bedtime as needed for sleep.  05/14/15  Yes [provider]    ROS:  Out of a complete 14 system review of symptoms, the patient complains only of the following symptoms, and all other reviewed  systems are negative.  Gait disorder, falls  Blood pressure (!) 154/84, pulse 94, height 6\' 1"  (1.854 m), weight (!) 332 lb (150.6 kg).  Physical Exam  General: The patient is alert and cooperative at the time of the examination.  The patient is markedly obese.  Skin: 2-3+ edema below the knees is seen bilaterally.   Neurologic Exam  Mental status: The patient is alert and oriented x 3 at the time of the examination. The patient has apparent normal recent and remote memory, with an apparently normal attention span and concentration ability.   Cranial nerves: Facial symmetry is present. Speech is slurred, not aphasic. Extraocular movements are full. Visual fields are full.  Pupils are equal, round, and  reactive to light.  Discs are flat bilaterally.  Optic atrophy is seen bilaterally.  Motor: The patient has good strength in all 4 extremities, with exception of some slight weakness with hip flexion on the left.  Sensory examination: Soft touch sensation is symmetric on the face, arms, and legs.  Coordination: The patient has good finger-nose-finger and heel-to-shin bilaterally.  Gait and station: The patient has a slightly wide-based gait, the patient is able to walk independently.  Tandem gait was not attempted.  Romberg is negative.  Reflexes: Deep tendon reflexes are symmetric.   MRI brain 05/11/17:  IMPRESSION:  This MRI of the brain with and without contrast shows the following: 1.     Multiple T2/FLAIR hyperintense foci in the upper spinal cord, cerebellum, brainstem and in the periventricular, juxtacortical and deep white matter of both hemispheres in a pattern and configuration consistent with demyelinating plaques associated with multiple sclerosis. Some foci appear to be acute to subacute with enhancement and/or hyperintensity on diffusion-weighted images.  2.    Compared to the MRI dated 05/12/2016, some of the foci appeared to have developed during the interim including a large left parietal plaque.  * MRI scan images were reviewed online. I agree with the written report.    Assessment/Plan:  1.  Multiple sclerosis  2.  Gait disorder  3.  History of schizophrenia  The patient has been missing doses of the Tysabri.  To help compliance, we will convert to  Ocrevus.  The patient has evidence of progression of the multiple sclerosis by MRI.  The patient will have blood work done today, we will try to switch him over to Ocrevus, he will stop Tysabri therapy.  Brandon [name]. Keith Willis MD 06/05/2017 11:53 AM  [facility] [address] [place], [place] [id number]  Phone [phone] Fax [phone]

## 2017-06-06 LAB — CBC WITH DIFFERENTIAL/PLATELET
Basophils Absolute: 0 10*3/uL (ref 0.0–0.2)
Basos: 0 %
EOS (ABSOLUTE): 0.3 10*3/uL (ref 0.0–0.4)
Eos: 4 %
Hematocrit: 39.4 % (ref 37.5–51.0)
Hemoglobin: 12.5 g/dL — ABNORMAL LOW (ref 13.0–17.7)
Immature Grans (Abs): 0 10*3/uL (ref 0.0–0.1)
Immature Granulocytes: 0 %
Lymphocytes Absolute: 3.5 10*3/uL — ABNORMAL HIGH (ref 0.7–3.1)
Lymphs: 39 %
MCH: 25.4 pg — ABNORMAL LOW (ref 26.6–33.0)
MCHC: 31.7 g/dL (ref 31.5–35.7)
MCV: 80 fL (ref 79–97)
Monocytes Absolute: 0.7 10*3/uL (ref 0.1–0.9)
Monocytes: 8 %
Neutrophils Absolute: 4.4 10*3/uL (ref 1.4–7.0)
Neutrophils: 49 %
Platelets: 223 10*3/uL (ref 150–379)
RBC: 4.93 x10E6/uL (ref 4.14–5.80)
RDW: 15.4 % (ref 12.3–15.4)
WBC: 9 10*3/uL (ref 3.4–10.8)

## 2017-06-06 LAB — COMPREHENSIVE METABOLIC PANEL
ALT: 39 IU/L (ref 0–44)
AST: 21 IU/L (ref 0–40)
Albumin/Globulin Ratio: 1.7 (ref 1.2–2.2)
Albumin: 4.3 g/dL (ref 3.5–5.5)
Alkaline Phosphatase: 93 IU/L (ref 39–117)
BUN/Creatinine Ratio: 8 — ABNORMAL LOW (ref 9–20)
BUN: 9 mg/dL (ref 6–20)
Bilirubin Total: 0.4 mg/dL (ref 0.0–1.2)
CO2: 25 mmol/L (ref 20–29)
Calcium: 9.9 mg/dL (ref 8.7–10.2)
Chloride: 103 mmol/L (ref 96–106)
Creatinine, Ser: 1.07 mg/dL (ref 0.76–1.27)
GFR calc Af Amer: 111 mL/min/{1.73_m2} (ref >59)
GFR calc non Af Amer: 96 mL/min/{1.73_m2} (ref >59)
Globulin, Total: 2.6 g/dL (ref 1.5–4.5)
Glucose: 100 mg/dL — ABNORMAL HIGH (ref 65–99)
Potassium: 4.2 mmol/L (ref 3.5–5.2)
Sodium: 141 mmol/L (ref 134–144)
Total Protein: 6.9 g/dL (ref 6.0–8.5)

## 2017-06-06 LAB — HEPATITIS B CORE ANTIBODY, TOTAL: Hep B Core Total Ab: NEGATIVE

## 2017-06-06 LAB — HEPATITIS C ANTIBODY: Hep C Virus Ab: <0.1 s/co ratio (ref 0.0–0.9)

## 2017-06-06 LAB — VARICELLA ZOSTER ANTIBODY, IGG: Varicella zoster IgG: 1672 index (ref >165)

## 2017-06-08 ENCOUNTER — Telehealth: Payer: Self-pay | Admitting: *Deleted

## 2017-06-08 NOTE — Telephone Encounter (Signed)
Faxed completed/signed ocrevus start form to [place] [facility] at [phone]. Received fax confirmation.

## 2017-06-08 NOTE — Telephone Encounter (Signed)
Received fax notification from [place] [facility] that they received ocrevus start form. They are processing for patient.

## 2017-06-12 ENCOUNTER — Encounter (HOSPITAL_COMMUNITY): Payer: Medicare Other

## 2017-06-19 ENCOUNTER — Telehealth: Payer: Self-pay | Admitting: Neurology

## 2017-06-19 NOTE — Telephone Encounter (Signed)
Pt mother is asking for a call to know if the new medication has been approved through [employer] and if so has an appointment already been scheduled for pt next month.

## 2017-07-01 NOTE — Telephone Encounter (Signed)
Gave pt start form to [facility]. They are going to look into this and resend for pt.

## 2017-07-01 NOTE — Telephone Encounter (Signed)
Called and LVM for pt to call office back. Spoke with Inetta Fermoina.  If he calls, please let him know we are working on Therapist, occupationalinsurance approval. [facility] (Tina/Mindy/Tasha) will call him as soon as we hear something from his insurance.

## 2017-07-01 NOTE — Telephone Encounter (Signed)
Pt mother called(no DPR found) she put pt on and he explained that he is looking for an update on when he will start Ocrevus, he is asking for a call with an update

## 2017-07-02 ENCOUNTER — Telehealth: Payer: Self-pay | Admitting: Neurology

## 2017-07-02 MED ORDER — PREDNISONE 10 MG PO TABS: ORAL_TABLET | ORAL | 0 refills | Status: DC

## 2017-07-02 NOTE — Telephone Encounter (Signed)
I called the mother.  The patient has had trouble walking over the last 10 days or 2 weeks.  He is having difficulty with the left leg.  Will call in a prescription for prednisone, we will try to get him on Ocrevus in the near future.  He has had progression of disease on Tysabri.

## 2017-07-02 NOTE — Telephone Encounter (Signed)
Lori/[facility] [phone] called said the pt is having a flare up, the left foot is dragging more over the past 2 weeks. Please call pt's mother to advise.

## 2017-07-02 NOTE — Telephone Encounter (Signed)
Pt called back Lori/[facility] and was advised of the message below. He was appreciative  [employer]

## 2017-07-13 ENCOUNTER — Telehealth: Payer: Self-pay | Admitting: Neurology

## 2017-07-13 NOTE — Telephone Encounter (Signed)
I called back and spoke with Darl Pikes. I made her aware that patient has discontinued Tysabri.

## 2017-07-13 NOTE — Telephone Encounter (Signed)
Brandon [name] with [employer] is calling to find out if the patient has discontinued taking Tysabri.

## 2017-07-14 ENCOUNTER — Telehealth: Payer: Self-pay | Admitting: *Deleted

## 2017-07-14 ENCOUNTER — Encounter (HOSPITAL_COMMUNITY): Payer: Medicare Other

## 2017-07-14 NOTE — Telephone Encounter (Signed)
Faxed completed/signed Tysabri discontinuation questionnaire back to [place] [facility] at [phone]. Received fax confirmation.  Also received notification from [facility] that [facility] notified that pt no longer authorized to receive tysabri infusions.

## 2017-07-16 ENCOUNTER — Telehealth: Payer: Self-pay | Admitting: Neurology

## 2017-07-16 NOTE — Telephone Encounter (Signed)
Dr. Anne Hahn- how do you want to proceed? No DPR on file for patient. Cannot authorize for them to speak with mother

## 2017-07-16 NOTE — Telephone Encounter (Signed)
I called the representative for Ocrevus.  They will need to call the mother regarding treatment management.  The mother is the power of attorney for the patient.  Patient is mentally incompetent, he has a history of schizophrenia.  I called and left a message.  If they need to contact me I gave them my cell phone number.

## 2017-07-16 NOTE — Telephone Encounter (Signed)
Pt mother has called stating that she has spoken with Glorianne Manchester and they need a call from RN or Dr Anne Hahn advising them that she(being the mother) it is okay for them to speak with her.  Pt mother is stating the call needs to go directly to ReAnn @ [phone] xt [phone].  Pt mother states this needs to be resolved as quickly as possible, this has to do with pt's medication being paid for.  Pt has lalready missed his December shot.  Please call Ocrevus and pt mother

## 2017-07-24 ENCOUNTER — Emergency Department (HOSPITAL_COMMUNITY)
Admission: EM | Admit: 2017-07-24 | Discharge: 2017-07-27 | Disposition: A | Discharge status: Home / Self Care | Payer: Medicare Other | Attending: Emergency Medicine | Admitting: Emergency Medicine

## 2017-07-24 ENCOUNTER — Encounter (HOSPITAL_COMMUNITY): Payer: Self-pay | Admitting: Nurse Practitioner

## 2017-07-24 DIAGNOSIS — F319 Bipolar disorder, unspecified: Secondary | ICD-10-CM | POA: Diagnosis not present

## 2017-07-24 DIAGNOSIS — R4689 Other symptoms and signs involving appearance and behavior: Secondary | ICD-10-CM | POA: Diagnosis present

## 2017-07-24 DIAGNOSIS — Z818 Family history of other mental and behavioral disorders: Secondary | ICD-10-CM | POA: Diagnosis not present

## 2017-07-24 DIAGNOSIS — I10 Essential (primary) hypertension: Secondary | ICD-10-CM | POA: Diagnosis not present

## 2017-07-24 DIAGNOSIS — F1290 Cannabis use, unspecified, uncomplicated: Secondary | ICD-10-CM | POA: Diagnosis not present

## 2017-07-24 DIAGNOSIS — G35 Multiple sclerosis: Secondary | ICD-10-CM | POA: Diagnosis not present

## 2017-07-24 DIAGNOSIS — F17210 Nicotine dependence, cigarettes, uncomplicated: Secondary | ICD-10-CM | POA: Insufficient documentation

## 2017-07-24 DIAGNOSIS — F209 Schizophrenia, unspecified: Secondary | ICD-10-CM | POA: Diagnosis not present

## 2017-07-24 DIAGNOSIS — F419 Anxiety disorder, unspecified: Secondary | ICD-10-CM | POA: Diagnosis not present

## 2017-07-24 DIAGNOSIS — F201 Disorganized schizophrenia: Secondary | ICD-10-CM | POA: Diagnosis present

## 2017-07-24 DIAGNOSIS — F200 Paranoid schizophrenia: Secondary | ICD-10-CM | POA: Diagnosis not present

## 2017-07-24 LAB — CBC WITH DIFFERENTIAL/PLATELET
Basophils Absolute: 0 10*3/uL (ref 0.0–0.1)
Basophils Relative: 0 %
Eosinophils Absolute: 0.5 10*3/uL (ref 0.0–0.7)
Eosinophils Relative: 6 %
HCT: 40.1 % (ref 39.0–52.0)
Hemoglobin: 12.5 g/dL — ABNORMAL LOW (ref 13.0–17.0)
Lymphocytes Relative: 29 %
Lymphs Abs: 2.3 10*3/uL (ref 0.7–4.0)
MCH: 25.2 pg — ABNORMAL LOW (ref 26.0–34.0)
MCHC: 31.2 g/dL (ref 30.0–36.0)
MCV: 80.7 fL (ref 78.0–100.0)
Monocytes Absolute: 1.1 10*3/uL — ABNORMAL HIGH (ref 0.1–1.0)
Monocytes Relative: 13 %
Neutro Abs: 4.1 10*3/uL (ref 1.7–7.7)
Neutrophils Relative %: 52 %
Platelets: 189 10*3/uL (ref 150–400)
RBC: 4.97 MIL/uL (ref 4.22–5.81)
RDW: 15.9 % — ABNORMAL HIGH (ref 11.5–15.5)
WBC: 8 10*3/uL (ref 4.0–10.5)

## 2017-07-24 LAB — URINALYSIS, ROUTINE W REFLEX MICROSCOPIC
Bilirubin Urine: NEGATIVE
Glucose, UA: NEGATIVE mg/dL
Hgb urine dipstick: NEGATIVE
Ketones, ur: NEGATIVE mg/dL
Leukocytes, UA: NEGATIVE
Nitrite: NEGATIVE
Protein, ur: NEGATIVE mg/dL
Specific Gravity, Urine: 1.021 (ref 1.005–1.030)
pH: 7 (ref 5.0–8.0)

## 2017-07-24 LAB — RAPID URINE DRUG SCREEN, HOSP PERFORMED
Amphetamines: NOT DETECTED
Barbiturates: NOT DETECTED
Benzodiazepines: NOT DETECTED
Cocaine: NOT DETECTED
Opiates: NOT DETECTED
Tetrahydrocannabinol: POSITIVE — AB

## 2017-07-24 LAB — COMPREHENSIVE METABOLIC PANEL
ALT: 20 U/L (ref 17–63)
AST: 15 U/L (ref 15–41)
Albumin: 3.9 g/dL (ref 3.5–5.0)
Alkaline Phosphatase: 73 U/L (ref 38–126)
Anion gap: 6 (ref 5–15)
BUN: 13 mg/dL (ref 6–20)
CO2: 29 mmol/L (ref 22–32)
Calcium: 9.1 mg/dL (ref 8.9–10.3)
Chloride: 103 mmol/L (ref 101–111)
Creatinine, Ser: 0.93 mg/dL (ref 0.61–1.24)
GFR calc Af Amer: >60 mL/min (ref >60)
GFR calc non Af Amer: >60 mL/min (ref >60)
Glucose, Bld: 88 mg/dL (ref 65–99)
Potassium: 3.8 mmol/L (ref 3.5–5.1)
Sodium: 138 mmol/L (ref 135–145)
Total Bilirubin: 0.3 mg/dL (ref 0.3–1.2)
Total Protein: 7.1 g/dL (ref 6.5–8.1)

## 2017-07-24 LAB — ETHANOL: Alcohol, Ethyl (B): <10 mg/dL (ref <10)

## 2017-07-24 LAB — ACETAMINOPHEN LEVEL: Acetaminophen (Tylenol), Serum: <10 ug/mL — ABNORMAL LOW (ref 10–30)

## 2017-07-24 LAB — VALPROIC ACID LEVEL: Valproic Acid Lvl: 31 ug/mL — ABNORMAL LOW (ref 50.0–100.0)

## 2017-07-24 MED ORDER — STERILE WATER FOR INJECTION IJ SOLN
INTRAMUSCULAR | Status: AC
  Administered 2017-07-24: 10 mL
  Filled 2017-07-24: qty 10

## 2017-07-24 MED ORDER — ACETAMINOPHEN 325 MG PO TABS
650.0000 mg | ORAL_TABLET | ORAL | Status: DC | PRN
  Administered 2017-07-26: 650 mg via ORAL
  Filled 2017-07-24: qty 2

## 2017-07-24 MED ORDER — CLOZAPINE 25 MG PO TABS
25.0000 mg | ORAL_TABLET | Freq: Every day | ORAL | Status: DC
  Administered 2017-07-25 – 2017-07-27 (×3): 25 mg via ORAL
  Filled 2017-07-24 (×4): qty 1

## 2017-07-24 MED ORDER — BENZTROPINE MESYLATE 1 MG PO TABS
1.0000 mg | ORAL_TABLET | Freq: Every day | ORAL | Status: DC
  Administered 2017-07-24 – 2017-07-27 (×4): 1 mg via ORAL
  Filled 2017-07-24 (×4): qty 1

## 2017-07-24 MED ORDER — AMLODIPINE BESYLATE 5 MG PO TABS
10.0000 mg | ORAL_TABLET | Freq: Every day | ORAL | Status: DC
  Administered 2017-07-24 – 2017-07-27 (×4): 10 mg via ORAL
  Filled 2017-07-24 (×4): qty 2

## 2017-07-24 MED ORDER — PROPRANOLOL HCL 10 MG PO TABS
10.0000 mg | ORAL_TABLET | Freq: Two times a day (BID) | ORAL | Status: DC
  Administered 2017-07-24 – 2017-07-27 (×6): 10 mg via ORAL
  Filled 2017-07-24 (×6): qty 1

## 2017-07-24 MED ORDER — ZIPRASIDONE MESYLATE 20 MG IM SOLR
20.0000 mg | Freq: Once | INTRAMUSCULAR | Status: AC
  Administered 2017-07-24: 20 mg via INTRAMUSCULAR
  Filled 2017-07-24: qty 20

## 2017-07-24 MED ORDER — DIVALPROEX SODIUM 500 MG PO DR TAB: 500.0000 mg | DELAYED_RELEASE_TABLET | Freq: Two times a day (BID) | ORAL | Status: DC

## 2017-07-24 MED ORDER — TRAZODONE HCL 100 MG PO TABS
200.0000 mg | ORAL_TABLET | Freq: Every evening | ORAL | Status: DC | PRN
  Administered 2017-07-24 – 2017-07-26 (×3): 200 mg via ORAL
  Filled 2017-07-24 (×3): qty 2

## 2017-07-24 MED ORDER — CLOZAPINE 25 MG PO TABS
37.5000 mg | ORAL_TABLET | Freq: Every day | ORAL | Status: DC
  Administered 2017-07-24 – 2017-07-26 (×3): 37.5 mg via ORAL
  Filled 2017-07-24 (×4): qty 2

## 2017-07-24 MED ORDER — CLOZAPINE 25 MG PO TABS
25.0000 mg | ORAL_TABLET | Freq: Two times a day (BID) | ORAL | Status: DC
Start: 2017-07-24 — End: 2017-07-24
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
## 2017-07-24 NOTE — BHH Counselor (Signed)
Brandon Conn, NP recommends inpatient treatment. At this time there are not any beds for the pt at [facility].  The pt will be referred out.

## 2017-07-24 NOTE — ED Notes (Signed)
SBAR Report received from previous nurse. Pt received calm and visible on unit. Pt denies current SI/ HI, depression, anxiety, or pain at this time, and appears otherwise stable and free of distress. Pt endorses that he sees shadows and they talk to him but would not elaborate further at this time, Pt reminded of camera surveillance, q 15 min rounds, and rules of the milieu. Will continue to assess.

## 2017-07-24 NOTE — ED Notes (Signed)
Bed: DYJ09 Expected date:  Expected time:  Means of arrival:  Comments: [place]
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
## 2017-07-25 DIAGNOSIS — Z818 Family history of other mental and behavioral disorders: Secondary | ICD-10-CM | POA: Diagnosis not present

## 2017-07-25 DIAGNOSIS — F209 Schizophrenia, unspecified: Secondary | ICD-10-CM | POA: Diagnosis not present

## 2017-07-25 DIAGNOSIS — G4700 Insomnia, unspecified: Secondary | ICD-10-CM

## 2017-07-25 DIAGNOSIS — F17210 Nicotine dependence, cigarettes, uncomplicated: Secondary | ICD-10-CM | POA: Diagnosis not present

## 2017-07-25 DIAGNOSIS — F200 Paranoid schizophrenia: Secondary | ICD-10-CM | POA: Diagnosis not present

## 2017-07-25 DIAGNOSIS — F1290 Cannabis use, unspecified, uncomplicated: Secondary | ICD-10-CM

## 2017-07-25 MED ORDER — DIVALPROEX SODIUM 500 MG PO DR TAB
500.0000 mg | DELAYED_RELEASE_TABLET | Freq: Two times a day (BID) | ORAL | Status: DC
  Administered 2017-07-25 – 2017-07-27 (×5): 500 mg via ORAL
  Filled 2017-07-25 (×5): qty 1

## 2017-07-25 MED ORDER — HYDROXYZINE HCL 25 MG PO TABS
25.0000 mg | ORAL_TABLET | Freq: Two times a day (BID) | ORAL | Status: DC
  Administered 2017-07-25 – 2017-07-27 (×5): 25 mg via ORAL
  Filled 2017-07-25 (×5): qty 1
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
## 2017-07-25 NOTE — ED Notes (Signed)
Pt complaint with medication regimen. Pt not endorsing SI/HI. Pt appears to be responding to internal stimuli at times. Pt restless and anxious at times. Encouragement and support provided. Special checks q 15 mins in place for safety, Video monitoring in place. Will continue to monitor.

## 2017-07-25 NOTE — Progress Notes (Signed)
EDP signed 1st exam. CSW placed a copy in the pt's chart and a copy in the IVC logbook.  Please reconsult if future social work needs arise.  CSW signing off, as social work intervention is no longer needed.  Brandon [name]. Mahlon Gabrielle, LCSW, LCAS, CSI Clinical Social Worker Ph: [phone]

## 2017-07-25 NOTE — ED Notes (Signed)
Pt taking a shower 
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
## 2017-07-25 NOTE — ED Notes (Addendum)
SBAR Report received from previous nurse. Pt received calm and visible on unit. Pt denies current SI/ HI,  depression, anxiety, or pain at this time, and appears otherwise stable and free of distress. Pt continues to endorse seeing shadows that talk to him. Pt reminded of camera surveillance, q 15 min rounds, and rules of the milieu. Will continue to assess.
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
## 2017-07-26 DIAGNOSIS — Z653 Problems related to other legal circumstances: Secondary | ICD-10-CM | POA: Diagnosis not present

## 2017-07-26 DIAGNOSIS — F209 Schizophrenia, unspecified: Secondary | ICD-10-CM | POA: Diagnosis not present

## 2017-07-26 DIAGNOSIS — Z818 Family history of other mental and behavioral disorders: Secondary | ICD-10-CM | POA: Diagnosis not present

## 2017-07-26 DIAGNOSIS — F17210 Nicotine dependence, cigarettes, uncomplicated: Secondary | ICD-10-CM | POA: Diagnosis not present

## 2017-07-26 DIAGNOSIS — F419 Anxiety disorder, unspecified: Secondary | ICD-10-CM | POA: Diagnosis not present

## 2017-07-26 DIAGNOSIS — F200 Paranoid schizophrenia: Secondary | ICD-10-CM | POA: Diagnosis not present

## 2017-07-26 DIAGNOSIS — F1290 Cannabis use, unspecified, uncomplicated: Secondary | ICD-10-CM | POA: Diagnosis not present

## 2017-07-26 MED ORDER — DOCUSATE SODIUM 100 MG PO CAPS
100.0000 mg | ORAL_CAPSULE | Freq: Once | ORAL | Status: AC
  Administered 2017-07-26: 100 mg via ORAL
  Filled 2017-07-26: qty 1
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
## 2017-07-26 NOTE — ED Notes (Signed)
Pt compliant with medication regimen. Pt disorganized on approach. Noted talking to self and laughing inappropriately at times. Pt appears to be responding to internal stimuli. Pt not endorsing SI/HI. Encouragement and support provided. Special checks q 15 mins in place for safety, Video monitoring in place. Will continue to monitor.

## 2017-07-27 DIAGNOSIS — F1290 Cannabis use, unspecified, uncomplicated: Secondary | ICD-10-CM | POA: Diagnosis not present

## 2017-07-27 DIAGNOSIS — R441 Visual hallucinations: Secondary | ICD-10-CM | POA: Diagnosis not present

## 2017-07-27 DIAGNOSIS — R440 Auditory hallucinations: Secondary | ICD-10-CM | POA: Diagnosis not present

## 2017-07-27 DIAGNOSIS — F200 Paranoid schizophrenia: Secondary | ICD-10-CM | POA: Diagnosis not present

## 2017-07-27 DIAGNOSIS — Z818 Family history of other mental and behavioral disorders: Secondary | ICD-10-CM | POA: Diagnosis not present

## 2017-07-27 DIAGNOSIS — G35 Multiple sclerosis: Secondary | ICD-10-CM | POA: Diagnosis not present

## 2017-07-27 DIAGNOSIS — F17210 Nicotine dependence, cigarettes, uncomplicated: Secondary | ICD-10-CM | POA: Diagnosis not present

## 2017-07-27 NOTE — Consult Note (Signed)
[facility] Face-to-Face Psychiatry Consult   Reason for Consult:  Agressive behavior Referring Physician:  EDP Patient Identification: Brandon [name] MRN:  [id number] Principal Diagnosis: Paranoid schizophrenia [facility]) Diagnosis:   Patient Active Problem List   Diagnosis Date Noted  . Paranoid schizophrenia ([facility]) [F20.0]   . Hallucinations [R44.3] 08/05/2014  . Auditory hallucination [R44.0]   . Left-sided weakness [R53.1] 01/18/2014  . Multiple sclerosis ([facility]) [G35] 05/20/2013  . White matter abnormality on MRI of brain [R90.82] 11/23/2012  . Abnormality of gait [R26.9] 11/23/2012  . HTN (hypertension) [I10] 07/04/2011  . Ataxia [R27.0] 07/02/2011  . OBESITY [E66.9] 01/14/2010  . ADHD [F90.9] 01/14/2010    Total Time spent with patient: 45 minutes  Subjective:   Brandon [name] is a 27 y.o. male patient admitted with agitation and aggressive behavior due to auditory and visual hallucinations.  HPI:  Pt was seen and chart reviewed with treatment team and Dr Sharma Covert. Pt stated he lives with his mother. Pt admits to smoking marijuana and stated it is the only thing that calms him down. Pt's UDS positive for THC, BAL negative. Pt stated he sees shadows and hear voices but they are good voices and he is not scared by them.  Pt denies suicidal/homicidal ideation but does appear to be responding to internal stimuli. Pt has a [facility] team. Pt is a poor historian and did not appear to know why he is at the hospital. Pt stated he has been hospitalized before but does not know when. Pt appears to have limited intelligence and poor recall. Pt has a history of Multiple Sclerosis and has been receiving Tysabri injections. Pt was unsure when his next injection is. Pt is psychiatrically clear for discharge and [facility] will contacted.    Past Psychiatric History: As above  Risk to Self: None Risk to Others: None Prior Inpatient Therapy: Prior Inpatient Therapy: Yes Prior Therapy Dates: unable to  assess Prior Therapy Facilty/Provider(s): [facility] and unable to assess others Reason for Treatment: psychosis Prior Outpatient Therapy: Prior Outpatient Therapy: Yes Prior Therapy Dates: current Prior Therapy Facilty/Provider(s): [facility] Reason for Treatment: psychosis Does patient have an ACCT team?: Yes Does patient have Intensive In-House Services?  : No Does patient have [facility] services? : No Does patient have P[facility] services?: No  Past Medical History:  Past Medical History:  Diagnosis Date  . ADHD (attention deficit hyperactivity disorder)   . Bipolar 1 disorder ([facility])   . Chronic back pain   . Chronic neck pain   . Hypertension   . Multiple sclerosis ([facility]) 05/20/2013   left sided weakness, dysarthria  . Non-compliance   . Obesity   . Schizophrenia ([facility])   . White matter abnormality on MRI of brain 11/23/2012    Past Surgical History:  Procedure Laterality Date  . None     Family History:  Family History  Problem Relation Age of Onset  . Diabetes Mother   . ADD / ADHD Brother    Family Psychiatric  History: Unknown Social History:  Social History   Substance and Sexual Activity  Alcohol Use Yes  . Alcohol/week: 0.0 oz   Comment: "A little bit"      Social History   Substance and Sexual Activity  Drug Use Yes  . Types: Marijuana   Comment: Last used: unknown     Social History   Socioeconomic History  . Marital status: Single    Spouse name: None  . Number of children: 0  . Years of  education: 11th  . Highest education level: None  Social Needs  . Financial resource strain: None  . Food insecurity - worry: None  . Food insecurity - inability: None  . Transportation needs - medical: None  . Transportation needs - non-medical: None  Occupational History    Employer: Teacher, [employer] as a foreign language    Comment: Disbaled  Tobacco Use  . Smoking status: Current Every Day Smoker    Packs/day: 0.25    Types: Cigarettes  . Smokeless tobacco: Never Used  . Tobacco  comment: 4 cigarettes a day  Substance and Sexual Activity  . Alcohol use: Yes    Alcohol/week: 0.0 oz    Comment: "A little bit"   . Drug use: Yes    Types: Marijuana    Comment: Last used: unknown   . Sexual activity: None  Other Topics Concern  . None  Social History Narrative   Patient lives at home with his mother.   Disabled.   Education 11 th grade .   Right handed.   Caffeine - one cup daily soda.   Additional Social History: N/A    Allergies:  No Known Allergies  Labs: No results found for this or any previous visit (from the past 48 hour(s)).  Current Facility-Administered Medications  Medication Dose Route Frequency Provider Last Rate Last Dose  . acetaminophen (TYLENOL) tablet 650 mg  650 mg Oral Q4H PRN Arby Barrette, MD   650 mg at 07/26/17 0951  . amLODipine (NORVASC) tablet 10 mg  10 mg Oral Daily Arby Barrette, MD   10 mg at 07/27/17 1047  . benztropine (COGENTIN) tablet 1 mg  1 mg Oral Daily Arby Barrette, MD   1 mg at 07/27/17 1047  . cloZAPine (CLOZARIL) tablet 25 mg  25 mg Oral Daily Arby Barrette, MD   25 mg at 07/27/17 1048   And  . cloZAPine (CLOZARIL) tablet 37.5 mg  37.5 mg Oral QHS Arby Barrette, MD   37.5 mg at 07/26/17 2107  . divalproex (DEPAKOTE) DR tablet 500 mg  500 mg Oral Q12H Charm Rings, NP   500 mg at 07/27/17 1047  . hydrOXYzine (ATARAX/VISTARIL) tablet 25 mg  25 mg Oral BID Akintayo, Mojeed, MD   25 mg at 07/27/17 1048  . natalizumab (TYSABRI) 300 mg in sodium chloride 0.9 % 100 mL IVPB  300 mg Intravenous Q28 days York Spaniel, MD      . propranolol (INDERAL) tablet 10 mg  10 mg Oral BID Arby Barrette, MD   10 mg at 07/27/17 1048  . traZODone (DESYREL) tablet 200 mg  200 mg Oral QHS PRN Arby Barrette, MD   200 mg at 07/26/17 2107   Current Outpatient Medications  Medication Sig Dispense Refill  . amLODipine (NORVASC) 10 MG tablet Take 10 mg by mouth daily.    . benztropine (COGENTIN) 1 MG tablet Take 1 tablet (1  mg total) by mouth 2 (two) times daily. (Patient taking differently: Take 1 mg by mouth daily. ) 60 tablet 0  . cloZAPine (CLOZARIL) 25 MG tablet Take 25-37.5 mg by mouth 2 (two) times daily. 25 mg every morning, 37.5 mg every night    . divalproex (DEPAKOTE) 500 MG DR tablet Take 500 mg by mouth 2 (two) times daily.    Hinda Glatter SUSTENNA 234 MG/1.5ML SUSP injection Inject 234 mg into the muscle every 30 (thirty) days.     . Melatonin 5 MG TABS Take 10 mg by mouth at bedtime.     Marland Kitchen  propranolol (INDERAL) 10 MG tablet Take 1 tablet (10 mg total) by mouth 2 (two) times daily. 60 tablet 0  . traZODone (DESYREL) 100 MG tablet Take 200 mg by mouth at bedtime as needed for sleep.     . predniSONE (DELTASONE) 10 MG tablet Begin taking 6 tablets daily, taper by one tablet every other day until off the medication. (Patient not taking: Reported on 07/24/2017) 42 tablet 0    Musculoskeletal: Strength & Muscle Tone: within normal limits Gait & Station: normal Patient leans: N/A  Psychiatric Specialty Exam: Physical Exam  Nursing note and vitals reviewed. Constitutional: He appears well-developed and well-nourished.  HENT:  Head: Normocephalic and atraumatic.  Neck: Normal range of motion.  Respiratory: Effort normal.  Musculoskeletal: Normal range of motion.  Neurological: He is alert.  Psychiatric: Thought content normal. His speech is delayed. He is slowed. Cognition and memory are impaired. He expresses impulsivity. He exhibits a depressed mood.    Review of Systems  Psychiatric/Behavioral: Positive for depression, hallucinations and substance abuse. Negative for memory loss and suicidal ideas. The patient is not nervous/anxious and does not have insomnia.   All other systems reviewed and are negative.   Blood pressure 135/87, pulse 87, temperature 98.6 F (37 C), resp. rate 20, SpO2 100 %.There is no height or weight on file to calculate BMI.  General Appearance: Disheveled  Eye Contact:  Fair   Speech:  Slow  Volume:  Decreased  Mood:  Depressed  Affect:  Congruent and Depressed  Thought Process:  Coherent  Orientation:  Full (Time, Place, and Person)  Thought Content:  Hallucinations: Auditory Visual  Suicidal Thoughts:  No  Homicidal Thoughts:  No  Memory:  Immediate;   Good Recent;   Good Remote;   Fair  Judgement:  Impaired  Insight:  Lacking  Psychomotor Activity:  Decreased  Concentration:  Concentration: Fair and Attention Span: Fair  Recall:  Poor  Fund of Knowledge:  Fair  Language:  Good  Akathisia:  No  Handed:  Right  AIMS (if indicated):    N/A  Assets:  Architect Housing  ADL's:  Intact  Cognition:  WNL  Sleep:   Poor     Treatment Plan Summary: Plan Paranoid schizophrenia ([facility])  Discharge Home Follow up with [facility] ACTT team Take all medications as prescribed.  Avoid the use of alcohol and illicit drugs  Disposition: No evidence of imminent risk to self or others at present.   Patient does not meet criteria for psychiatric inpatient admission. Supportive therapy provided about ongoing stressors.  Laveda Abbe, NP 07/27/2017 11:27 AM   Patient seen face-to-face for psychiatric evaluation, chart reviewed and case discussed with the physician extender and developed treatment plan. Reviewed the information documented and agree with the treatment plan.  Juanetta Beets, DO

## 2017-07-27 NOTE — [facility] Suicide Risk Assessment (Signed)
Suicide Risk Assessment  Discharge Assessment   [facility] Discharge Suicide Risk Assessment   Principal Problem: Paranoid schizophrenia [facility]) Discharge Diagnoses:  Patient Active Problem List   Diagnosis Date Noted  . Paranoid schizophrenia (HCC) [F20.0]   . Hallucinations [R44.3] 08/05/2014  . Auditory hallucination [R44.0]   . Left-sided weakness [R53.1] 01/18/2014  . Multiple sclerosis (HCC) [G35] 05/20/2013  . White matter abnormality on MRI of brain [R90.82] 11/23/2012  . Abnormality of gait [R26.9] 11/23/2012  . HTN (hypertension) [I10] 07/04/2011  . Ataxia [R27.0] 07/02/2011  . OBESITY [E66.9] 01/14/2010  . ADHD [F90.9] 01/14/2010    Total Time spent with patient: 45 minutes  Musculoskeletal: Strength & Muscle Tone: within normal limits Gait & Station: normal Patient leans: N/A  Psychiatric Specialty Exam: Physical Exam  Constitutional: He appears well-developed and well-nourished.  Respiratory: Effort normal.  Musculoskeletal: Normal range of motion.  Neurological: He is alert.  Psychiatric: Thought content normal. His speech is delayed. He is slowed. Cognition and memory are impaired. He expresses impulsivity. He exhibits a depressed mood.   Review of Systems  Psychiatric/Behavioral: Positive for depression, hallucinations and substance abuse. Negative for memory loss and suicidal ideas. The patient is not nervous/anxious and does not have insomnia.   All other systems reviewed and are negative.  Blood pressure 135/87, pulse 87, temperature 98.6 F (37 C), resp. rate 20, SpO2 100 %.There is no height or weight on file to calculate BMI. General Appearance: Disheveled Eye Contact:  Fair Speech:  Slow Volume:  Decreased Mood:  Depressed Affect:  Congruent and Depressed Thought Process:  Coherent Orientation:  Full (Time, Place, and Person) Thought Content:  Hallucinations: Auditory Visual Suicidal Thoughts:  No Homicidal Thoughts:  No Memory:  Immediate;    Good Recent;   Good Remote;   Fair Judgement:  Impaired Insight:  Lacking Psychomotor Activity:  Decreased Concentration:  Concentration: Fair and Attention Span: Fair Recall:  Poor Fund of Knowledge:  Fair Language:  Good Akathisia:  No Handed:  Right AIMS (if indicated):    Assets:  Architect Housing ADL's:  Intact Cognition:  WNL Sleep:   Poor   Mental Status Per Nursing Assessment::   On Admission:   aggressive behavior according to IVC paperwork  Demographic Factors:  Male, Adolescent or young adult, Low socioeconomic status and Unemployed  Loss Factors: Financial problems/change in socioeconomic status  Historical Factors: Impulsivity  Risk Reduction Factors:   Sense of responsibility to family and Living with another person, especially a relative  Continued Clinical Symptoms:  Alcohol/Substance Abuse/Dependencies Schizophrenia:   Paranoid or undifferentiated type Previous Psychiatric Diagnoses and Treatments Medical Diagnoses and Treatments/Surgeries  Cognitive Features That Contribute To Risk:  Closed-mindedness    Suicide Risk:  Minimal: No identifiable suicidal ideation.  Patients presenting with no risk factors but with morbid ruminations; may be classified as minimal risk based on the severity of the depressive symptoms    Plan Of Care/Follow-up recommendations:  Activity:  as tolerated Diet:  Heart Healthy  Laveda Abbe, NP 07/27/2017, 11:42 AM

## 2017-07-27 NOTE — BH Assessment (Signed)
[facility] Assessment Progress Note  Per Juanetta Beets, DO, this pt does not require psychiatric hospitalization at this time.  Pt presents under IVC initiated by [facility] staff, which Dr Sharma Covert has rescinded.  Pt is to be discharged from [facility] with recommendation to continue treatment with the [facility].  This has been included in pt's discharge instructions.  Pt's nurse, Aram Beecham, has been notified.  Doylene Canning, MA Triage Specialist [phone]

## 2017-07-27 NOTE — Discharge Instructions (Signed)
[facility] °     [facility], [address] °     [address] °     [place], [place]  [address] °     [phone] °     Crisis number: [phone] °

## 2017-07-27 NOTE — ED Notes (Signed)
Pt urinated in the bed 

## 2017-07-30 ENCOUNTER — Emergency Department (HOSPITAL_COMMUNITY): Payer: Medicare Other

## 2017-07-30 ENCOUNTER — Inpatient Hospital Stay (HOSPITAL_COMMUNITY)
Admission: EM | Admit: 2017-07-30 | Discharge: 2017-08-05 | DRG: 058 | Disposition: A | Discharge status: Home Health | Payer: Medicare Other | Attending: Internal Medicine | Admitting: Internal Medicine

## 2017-07-30 ENCOUNTER — Other Ambulatory Visit: Payer: Self-pay

## 2017-07-30 ENCOUNTER — Encounter (HOSPITAL_COMMUNITY): Payer: Self-pay

## 2017-07-30 DIAGNOSIS — Z560 Unemployment, unspecified: Secondary | ICD-10-CM | POA: Diagnosis not present

## 2017-07-30 DIAGNOSIS — G9340 Encephalopathy, unspecified: Secondary | ICD-10-CM | POA: Diagnosis not present

## 2017-07-30 DIAGNOSIS — R401 Stupor: Secondary | ICD-10-CM | POA: Diagnosis not present

## 2017-07-30 DIAGNOSIS — G92 Toxic encephalopathy: Secondary | ICD-10-CM | POA: Diagnosis present

## 2017-07-30 DIAGNOSIS — G4700 Insomnia, unspecified: Secondary | ICD-10-CM | POA: Diagnosis not present

## 2017-07-30 DIAGNOSIS — Z79899 Other long term (current) drug therapy: Secondary | ICD-10-CM

## 2017-07-30 DIAGNOSIS — E6609 Other obesity due to excess calories: Secondary | ICD-10-CM

## 2017-07-30 DIAGNOSIS — Z818 Family history of other mental and behavioral disorders: Secondary | ICD-10-CM | POA: Diagnosis not present

## 2017-07-30 DIAGNOSIS — Z6841 Body Mass Index (BMI) 40.0 and over, adult: Secondary | ICD-10-CM

## 2017-07-30 DIAGNOSIS — F909 Attention-deficit hyperactivity disorder, unspecified type: Secondary | ICD-10-CM | POA: Diagnosis present

## 2017-07-30 DIAGNOSIS — E669 Obesity, unspecified: Secondary | ICD-10-CM | POA: Diagnosis not present

## 2017-07-30 DIAGNOSIS — R739 Hyperglycemia, unspecified: Secondary | ICD-10-CM | POA: Diagnosis not present

## 2017-07-30 DIAGNOSIS — E119 Type 2 diabetes mellitus without complications: Secondary | ICD-10-CM | POA: Diagnosis not present

## 2017-07-30 DIAGNOSIS — F17210 Nicotine dependence, cigarettes, uncomplicated: Secondary | ICD-10-CM | POA: Diagnosis present

## 2017-07-30 DIAGNOSIS — R109 Unspecified abdominal pain: Secondary | ICD-10-CM | POA: Diagnosis not present

## 2017-07-30 DIAGNOSIS — F1290 Cannabis use, unspecified, uncomplicated: Secondary | ICD-10-CM | POA: Diagnosis not present

## 2017-07-30 DIAGNOSIS — I10 Essential (primary) hypertension: Secondary | ICD-10-CM | POA: Diagnosis present

## 2017-07-30 DIAGNOSIS — F201 Disorganized schizophrenia: Secondary | ICD-10-CM | POA: Diagnosis present

## 2017-07-30 DIAGNOSIS — F209 Schizophrenia, unspecified: Secondary | ICD-10-CM | POA: Diagnosis present

## 2017-07-30 DIAGNOSIS — F200 Paranoid schizophrenia: Secondary | ICD-10-CM | POA: Diagnosis present

## 2017-07-30 DIAGNOSIS — G35 Multiple sclerosis: Secondary | ICD-10-CM | POA: Diagnosis present

## 2017-07-30 DIAGNOSIS — F319 Bipolar disorder, unspecified: Secondary | ICD-10-CM | POA: Diagnosis present

## 2017-07-30 DIAGNOSIS — E66813 Obesity, class 3: Secondary | ICD-10-CM | POA: Diagnosis present

## 2017-07-30 DIAGNOSIS — R262 Difficulty in walking, not elsewhere classified: Secondary | ICD-10-CM | POA: Diagnosis not present

## 2017-07-30 DIAGNOSIS — I050 Rheumatic mitral stenosis: Secondary | ICD-10-CM | POA: Diagnosis not present

## 2017-07-30 DIAGNOSIS — R29898 Other symptoms and signs involving the musculoskeletal system: Secondary | ICD-10-CM | POA: Diagnosis not present

## 2017-07-30 LAB — I-STAT ARTERIAL BLOOD GAS, ED
Acid-Base Excess: 1 mmol/L (ref 0.0–2.0)
Bicarbonate: 25.8 mmol/L (ref 20.0–28.0)
O2 Saturation: 94 %
TCO2: 27 mmol/L (ref 22–32)
pCO2 arterial: 41.2 mmHg (ref 32.0–48.0)
pH, Arterial: 7.404 (ref 7.350–7.450)
pO2, Arterial: 73 mmHg — ABNORMAL LOW (ref 83.0–108.0)

## 2017-07-30 LAB — URINALYSIS, COMPLETE (UACMP) WITH MICROSCOPIC
Bacteria, UA: NONE SEEN
Bilirubin Urine: NEGATIVE
Glucose, UA: NEGATIVE mg/dL
Hgb urine dipstick: NEGATIVE
Ketones, ur: NEGATIVE mg/dL
Leukocytes, UA: NEGATIVE
Nitrite: NEGATIVE
Protein, ur: NEGATIVE mg/dL
RBC / HPF: NONE SEEN RBC/hpf (ref 0–5)
Specific Gravity, Urine: 1.009 (ref 1.005–1.030)
Squamous Epithelial / LPF: NONE SEEN
pH: 7 (ref 5.0–8.0)

## 2017-07-30 LAB — COMPREHENSIVE METABOLIC PANEL
ALT: 34 U/L (ref 17–63)
AST: 17 U/L (ref 15–41)
Albumin: 3.7 g/dL (ref 3.5–5.0)
Alkaline Phosphatase: 69 U/L (ref 38–126)
Anion gap: 11 (ref 5–15)
BUN: 17 mg/dL (ref 6–20)
CO2: 27 mmol/L (ref 22–32)
Calcium: 9.3 mg/dL (ref 8.9–10.3)
Chloride: 101 mmol/L (ref 101–111)
Creatinine, Ser: 1.2 mg/dL (ref 0.61–1.24)
GFR calc Af Amer: >60 mL/min (ref >60)
GFR calc non Af Amer: >60 mL/min (ref >60)
Glucose, Bld: 103 mg/dL — ABNORMAL HIGH (ref 65–99)
Potassium: 4.1 mmol/L (ref 3.5–5.1)
Sodium: 139 mmol/L (ref 135–145)
Total Bilirubin: 0.5 mg/dL (ref 0.3–1.2)
Total Protein: 6.5 g/dL (ref 6.5–8.1)

## 2017-07-30 LAB — CBC WITH DIFFERENTIAL/PLATELET
Basophils Absolute: 0 10*3/uL (ref 0.0–0.1)
Basophils Relative: 0 %
Eosinophils Absolute: 0.4 10*3/uL (ref 0.0–0.7)
Eosinophils Relative: 5 %
HCT: 42 % (ref 39.0–52.0)
Hemoglobin: 13.2 g/dL (ref 13.0–17.0)
Lymphocytes Relative: 35 %
Lymphs Abs: 2.9 10*3/uL (ref 0.7–4.0)
MCH: 25.2 pg — ABNORMAL LOW (ref 26.0–34.0)
MCHC: 31.4 g/dL (ref 30.0–36.0)
MCV: 80.3 fL (ref 78.0–100.0)
Monocytes Absolute: 0.7 10*3/uL (ref 0.1–1.0)
Monocytes Relative: 8 %
Neutro Abs: 4.2 10*3/uL (ref 1.7–7.7)
Neutrophils Relative %: 52 %
Platelets: 214 10*3/uL (ref 150–400)
RBC: 5.23 MIL/uL (ref 4.22–5.81)
RDW: 15.7 % — ABNORMAL HIGH (ref 11.5–15.5)
WBC: 8.3 10*3/uL (ref 4.0–10.5)

## 2017-07-30 LAB — CK: Total CK: 155 U/L (ref 49–397)

## 2017-07-30 LAB — VALPROIC ACID LEVEL: Valproic Acid Lvl: <10 ug/mL — ABNORMAL LOW (ref 50.0–100.0)

## 2017-07-30 LAB — HIV ANTIBODY (ROUTINE TESTING W REFLEX): HIV Screen 4th Generation wRfx: NONREACTIVE

## 2017-07-30 MED ORDER — ONDANSETRON HCL 4 MG PO TABS: 4.0000 mg | ORAL_TABLET | Freq: Four times a day (QID) | ORAL | Status: DC | PRN

## 2017-07-30 MED ORDER — NICOTINE 7 MG/24HR TD PT24
7.0000 mg | MEDICATED_PATCH | Freq: Every day | TRANSDERMAL | Status: DC
  Administered 2017-07-30 – 2017-08-05 (×7): 7 mg via TRANSDERMAL
  Filled 2017-07-30 (×8): qty 1

## 2017-07-30 MED ORDER — HYDRALAZINE HCL 20 MG/ML IJ SOLN: 10.0000 mg | INTRAMUSCULAR | Status: DC | PRN

## 2017-07-30 MED ORDER — ONDANSETRON HCL 4 MG/2ML IJ SOLN: 4.0000 mg | Freq: Four times a day (QID) | INTRAMUSCULAR | Status: DC | PRN

## 2017-07-30 MED ORDER — VALPROATE SODIUM 500 MG/5ML IV SOLN
1000.0000 mg | Freq: Once | INTRAVENOUS | Status: AC
  Administered 2017-07-30: 1000 mg via INTRAVENOUS
  Filled 2017-07-30: qty 10

## 2017-07-30 MED ORDER — ACETAMINOPHEN 650 MG RE SUPP: 650.0000 mg | Freq: Four times a day (QID) | RECTAL | Status: DC | PRN

## 2017-07-30 MED ORDER — BENZTROPINE MESYLATE 1 MG PO TABS
1.0000 mg | ORAL_TABLET | Freq: Two times a day (BID) | ORAL | Status: DC
  Administered 2017-07-30 – 2017-08-05 (×13): 1 mg via ORAL
  Filled 2017-07-30 (×13): qty 1

## 2017-07-30 MED ORDER — GADOBENATE DIMEGLUMINE 529 MG/ML IV SOLN
20.0000 mL | Freq: Once | INTRAVENOUS | Status: AC | PRN
  Administered 2017-07-30: 20 mL via INTRAVENOUS

## 2017-07-30 MED ORDER — VALPROATE SODIUM 500 MG/5ML IV SOLN
500.0000 mg | Freq: Two times a day (BID) | INTRAVENOUS | Status: DC
  Administered 2017-07-30 – 2017-08-03 (×8): 500 mg via INTRAVENOUS
  Filled 2017-07-30 (×8): qty 5

## 2017-07-30 MED ORDER — LACTATED RINGERS IV SOLN: INTRAVENOUS | Status: DC

## 2017-07-30 MED ORDER — SODIUM CHLORIDE 0.9 % IV SOLN
500.0000 mg | Freq: Once | INTRAVENOUS | Status: AC
  Administered 2017-07-30: 500 mg via INTRAVENOUS
  Filled 2017-07-30: qty 4

## 2017-07-30 MED ORDER — CLOZAPINE 25 MG PO TABS
25.0000 mg | ORAL_TABLET | Freq: Two times a day (BID) | ORAL | Status: DC
  Administered 2017-07-30 – 2017-08-01 (×5): 25 mg via ORAL
  Filled 2017-07-30 (×5): qty 1

## 2017-07-30 MED ORDER — PROPRANOLOL HCL 10 MG PO TABS
10.0000 mg | ORAL_TABLET | Freq: Two times a day (BID) | ORAL | Status: DC
  Administered 2017-07-30 – 2017-08-05 (×13): 10 mg via ORAL
  Filled 2017-07-30 (×14): qty 1

## 2017-07-30 MED ORDER — AMLODIPINE BESYLATE 10 MG PO TABS
10.0000 mg | ORAL_TABLET | Freq: Every day | ORAL | Status: DC
  Administered 2017-07-30 – 2017-08-05 (×7): 10 mg via ORAL
  Filled 2017-07-30 (×7): qty 1

## 2017-07-30 MED ORDER — SODIUM CHLORIDE 0.9 % IV SOLN
INTRAVENOUS | Status: DC
  Administered 2017-07-30: 06:00:00 via INTRAVENOUS

## 2017-07-30 MED ORDER — ACETAMINOPHEN 325 MG PO TABS: 650.0000 mg | ORAL_TABLET | Freq: Four times a day (QID) | ORAL | Status: DC | PRN

## 2017-07-30 MED ORDER — ENOXAPARIN SODIUM 40 MG/0.4ML ~~LOC~~ SOLN
40.0000 mg | SUBCUTANEOUS | Status: DC
  Administered 2017-07-30 – 2017-08-04 (×6): 40 mg via SUBCUTANEOUS
  Filled 2017-07-30 (×6): qty 0.4

## 2017-07-30 MED ORDER — SODIUM CHLORIDE 0.9 % IV SOLN
500.0000 mg | Freq: Two times a day (BID) | INTRAVENOUS | Status: DC
  Administered 2017-07-30 – 2017-08-05 (×12): 500 mg via INTRAVENOUS
  Filled 2017-07-30 (×13): qty 4

## 2017-07-30 NOTE — ED Triage Notes (Signed)
Patient arrives via EMS with neuro deficits due to flare up of MS according to patient's family; Pt is from home with family who all are not able to assist patient ; Pt fell today and was lying on the floor for at least an hour prior to EMS arrival; EMS states patient has garbaled speech with intermit left sided flaccidness but A&Ox 4; EMS states Mother states patient's last MS flare up left him paralyzed for 6 months; Pt is currently non-ambulatory on arrival-Monique,RN

## 2017-07-30 NOTE — ED Notes (Signed)
Report given to 5W RN-Melanie

## 2017-07-30 NOTE — Progress Notes (Signed)
   Follow Up Note  HPI: 27 year old male with past medical history of schizophrenia, bipolar disorder and multiple sclerosis admitted for acute encephalopathy as well as weakness felt to be secondary to MS exacerbation area confusion felt to be secondary to extra trazodone patient had taken for sleep. Received 1 dose of IV steroids in the emergency room. Pt admitted earlier this morning.  Seen in the emergency room. He is much more awake and alert now and is feeling a lot stronger following IV steroids. Vital signs stable  Exam: CV: Regular rate and rhythm, S1-S2 Lungs: Clear to auscultation bilaterally Abd: Soft, nontender, nondistended, positive bowel sounds Ext: No clubbing or cyanosis or edema  Present on Admission: . Multiple sclerosis exacerbation (HCC): Continue IV steroids for total of 5 days. Patient already doing much better. Await follow-up eval by PT, neurology following . Paranoid schizophrenia (HCC) and bipolar disorder: Procedure patient had reportedly been having auditory hallucinations. He tells me those have stopped. Tells me he still sees occasionally ghosts. We'll discuss with psychiatry . HTN (hypertension) . Acute encephalopathy: Looks of resolved. Secondary to trazodone   Disposition: Downgrade status to MedSurg bed. Allow food.

## 2017-07-30 NOTE — Consult Note (Signed)
Neurology Consultation Reason for Consult: MS flare Referring Physician: Freida Busman  CC: "I need my injection"  History is obtained from: Patient  HPI: Brandon [name] is a 27 y.o. male with a history of schizophrenia and multiple sclerosis who presents with worsening balance for a couple of days as well as difficulty speaking.  When I asked him what he means by "I need my injection" he states that he sometimes hears   things.  He is not very cooperative with further history taking than this.  He does not answer me when I asked him if he has been feeling more unsteady than typical.   ROS:  Unable to obtain due to cooperation  Past Medical History:  Diagnosis Date  . ADHD (attention deficit hyperactivity disorder)   . Bipolar 1 disorder (HCC)   . Chronic back pain   . Chronic neck pain   . Hypertension   . Multiple sclerosis (HCC) 05/20/2013   left sided weakness, dysarthria  . Non-compliance   . Obesity   . Schizophrenia (HCC)   . White matter abnormality on MRI of brain 11/23/2012     Family History  Problem Relation Age of Onset  . Diabetes Mother   . ADD / ADHD Brother      Social History:  reports that he has been smoking cigarettes.  He has been smoking about 0.25 packs per day. he has never used smokeless tobacco. He reports that he drinks alcohol. He reports that he uses drugs. Drug: Marijuana.   Exam: Current vital signs: BP 138/81 (BP Location: Right Arm)   Pulse 65   Temp 97.6 F (36.4 C) (Oral)   Resp 18   SpO2 100%  Vital signs in last 24 hours: Temp:  [97.4 F (36.3 C)-97.6 F (36.4 C)] 97.6 F (36.4 C) (01/24 0535) Pulse Rate:  [65-75] 65 (01/24 0535) Resp:  [0-18] 18 (01/24 0535) BP: (129-158)/(80-126) 138/81 (01/24 0535) SpO2:  [100 %] 100 % (01/24 0535)   Physical Exam  Constitutional: Appears well-developed and well-nourished.  Psych: Affect appropriate to situation Eyes: No scleral injection HENT: No OP obstrucion Head: Normocephalic.   Cardiovascular: Normal rate and regular rhythm.  Respiratory: Effort normal, non-labored breathing GI: Soft.  No distension. There is no tenderness.  Skin: WDI  Neuro: Mental Status: Patient is drowsy but arousable, he is oriented to person place, month and year.  He is not very participatory with exam due to drowsiness. Cranial Nerves: II: Visual Fields are full. Pupils are equal, round, and reactive to light.   III,IV, VI: He has an exotropia.  Actively resists formal testing. V: Facial sensation is symmetric to temperature VII: Facial movement is symmetric.  VIII: hearing is intact to voice X: Uvula elevates symmetrically XI: Shoulder shrug is symmetric. XII: tongue is midline without atrophy or fasciculations.  Motor: Tone is normal. Bulk is normal. 5/5 strength was present in all four extremities.  Sensory: Sensation is symmetric to light touch and temperature in the arms and legs. Cerebellar: Finger-nose-finger is slower on the right with some ataxia.   I have reviewed labs in epic and the results pertinent to this consultation are: CMP-unremarkable VPA level-undetectable  I have reviewed the images obtained: Brain-2 areas consistent with active demyelination with enhancement and restricted diffusion  Impression: 27 year old male with active demyelination by imaging in the setting of worsening gait and decreased speech.  I suspect his drowsiness is due to a combination of time of night as well as the extra  trazodone that he took.  He is being admitted for therapy and IV Solu-Medrol.  Recommendations: 1) Solu-Medrol 500 mg twice daily x 5 days 2) physical therapy 3) if the patient really is having more hallucinations than typical, may need to consult psychiatry as well.  Ritta Slot, MD [facility] [phone]  If 7pm- 7am, please page neurology on call as listed in AMION.

## 2017-07-30 NOTE — H&P (Addendum)
History and Physical    Brandon [name]:[id number] DOB: March 26, 1991 DOA: 07/30/2017  PCP: Devra Dopp, MD  Patient coming from: Home  I have personally briefly reviewed patient's old medical records in [facility]  Chief Complaint: Weakness  HPI: Brandon [name] is a 27 y.o. male with medical history significant of MS, schizophrenia.  [name] provides history due to AMS of patient.  Mother (Brandon [name]) reports that when she woke up this AM he had increased balance issues. Pt however was able to hold conversation. Around 10 pm she noted that patient was having increased difficulty to move around, and at 11 pm patient couldn't get up at all and was urinating on himself.  Pt has hx of schizophrenia and takes trazodone, he was given 3 trazadone tablets prior to he went to sleep.  (Normally supposed to take 2 tablets apparently).  Family reports that patient continues to have auditory hallucinations due to schizophrenia.  Pt's neurologist had stopped tysabari q monthly 3 months ago as it was not working and they are considering starting patient on new meds. Pt's last flair up that required admission was in 2015.   ED Course: In the ED patient not providing meaningful history.  Very sleepy right now, wakes up and talks to noxious stimuli.  Falls back asleep easily.  MAE.    Review of Systems: As per HPI otherwise 10 point review of systems negative.   Past Medical History:  Diagnosis Date  . ADHD (attention deficit hyperactivity disorder)   . Bipolar 1 disorder (HCC)   . Chronic back pain   . Chronic neck pain   . Hypertension   . Multiple sclerosis (HCC) 05/20/2013   left sided weakness, dysarthria  . Non-compliance   . Obesity   . Schizophrenia (HCC)   . White matter abnormality on MRI of brain 11/23/2012    Past Surgical History:  Procedure Laterality Date  . None       reports that he has been smoking cigarettes.  He has been smoking about  0.25 packs per day. he has never used smokeless tobacco. He reports that he drinks alcohol. He reports that he uses drugs. Drug: Marijuana.  No Known Allergies  Family History  Problem Relation Age of Onset  . Diabetes Mother   . ADD / ADHD Brother      Prior to Admission medications   Medication Sig Start Date End Date Taking? Authorizing Provider  amLODipine (NORVASC) 10 MG tablet Take 10 mg by mouth daily.    [provider]  benztropine (COGENTIN) 1 MG tablet Take 1 tablet (1 mg total) by mouth 2 (two) times daily. Patient taking differently: Take 1 mg by mouth daily.  04/02/15   Charm Rings, NP  cloZAPine (CLOZARIL) 25 MG tablet Take 25-37.5 mg by mouth 2 (two) times daily. 25 mg every morning, 37.5 mg every night    [provider]  divalproex (DEPAKOTE) 500 MG DR tablet Take 500 mg by mouth 2 (two) times daily.    [provider]  INVEGA SUSTENNA 234 MG/1.5ML SUSP injection Inject 234 mg into the muscle every 30 (thirty) days.  05/15/17   [provider]  Melatonin 5 MG TABS Take 10 mg by mouth at bedtime.     [provider]  predniSONE (DELTASONE) 10 MG tablet Begin taking 6 tablets daily, taper by one tablet every other day until off the medication. Patient not taking: Reported on 07/24/2017 07/02/17  York Spaniel, MD  propranolol (INDERAL) 10 MG tablet Take 1 tablet (10 mg total) by mouth 2 (two) times daily. 05/03/15   Charm Rings, NP  traZODone (DESYREL) 100 MG tablet Take 200 mg by mouth at bedtime as needed for sleep.  05/14/15   [provider]    Physical Exam: Vitals:   07/30/17 0057 07/30/17 0117  BP: (!) 158/126 129/80  Pulse: 75 70  Resp: 10 (!) 0  Temp: (!) 97.4 F (36.3 C)   TempSrc: Oral   SpO2: 100% 100%    Constitutional: Lethargic, wakes up and mumbles to stimuli. Eyes: PERRL, lids and conjunctivae normal ENMT: Mucous membranes are moist. Posterior pharynx clear of any exudate or  lesions.Normal dentition.  Neck: normal, supple, no masses, no thyromegaly Respiratory: clear to auscultation bilaterally, no wheezing, no crackles. Normal respiratory effort. No accessory muscle use.  Cardiovascular: Regular rate and rhythm, no murmurs / rubs / gallops. No extremity edema. 2+ pedal pulses. No carotid bruits.  Abdomen: no tenderness, no masses palpated. No hepatosplenomegaly. Bowel sounds positive.  Musculoskeletal: no clubbing / cyanosis. No joint deformity upper and lower extremities. Good ROM, no contractures. Normal muscle tone.  Skin: no rashes, lesions, ulcers. No induration Neurologic: MAE Psychiatric: Mumbling when he wakes up.  Not able to get much coherent out of patient.   Labs on Admission: I have personally reviewed following labs and imaging studies  CBC: Recent Labs  Lab 07/24/17 1801 07/30/17 0142  WBC 8.0 8.3  NEUTROABS 4.1 4.2  HGB 12.5* 13.2  HCT 40.1 42.0  MCV 80.7 80.3  PLT 189 214   Basic Metabolic Panel: Recent Labs  Lab 07/24/17 1801 07/30/17 0142  NA 138 139  K 3.8 4.1  CL 103 101  CO2 29 27  GLUCOSE 88 103*  BUN 13 17  CREATININE 0.93 1.20  CALCIUM 9.1 9.3   GFR: CrCl cannot be calculated (Unknown ideal weight.). Liver Function Tests: Recent Labs  Lab 07/24/17 1801 07/30/17 0142  AST 15 17  ALT 20 34  ALKPHOS 73 69  BILITOT 0.3 0.5  PROT 7.1 6.5  ALBUMIN 3.9 3.7   No results for input(s): LIPASE, AMYLASE in the last 168 hours. No results for input(s): AMMONIA in the last 168 hours. Coagulation Profile: No results for input(s): INR, PROTIME in the last 168 hours. Cardiac Enzymes: Recent Labs  Lab 07/30/17 0142  CKTOTAL 155   BNP (last 3 results) No results for input(s): PROBNP in the last 8760 hours. HbA1C: No results for input(s): HGBA1C in the last 72 hours. CBG: No results for input(s): GLUCAP in the last 168 hours. Lipid Profile: No results for input(s): CHOL, HDL, LDLCALC, TRIG, CHOLHDL, LDLDIRECT in  the last 72 hours. Thyroid Function Tests: No results for input(s): TSH, T4TOTAL, FREET4, T3FREE, THYROIDAB in the last 72 hours. Anemia Panel: No results for input(s): VITAMINB12, FOLATE, FERRITIN, TIBC, IRON, RETICCTPCT in the last 72 hours. Urine analysis:    Component Value Date/Time   COLORURINE YELLOW 07/24/2017 1801   APPEARANCEUR CLEAR 07/24/2017 1801   LABSPEC 1.021 07/24/2017 1801   PHURINE 7.0 07/24/2017 1801   GLUCOSEU NEGATIVE 07/24/2017 1801   HGBUR NEGATIVE 07/24/2017 1801   HGBUR negative 01/14/2010 1451   BILIRUBINUR NEGATIVE 07/24/2017 1801   KETONESUR NEGATIVE 07/24/2017 1801   PROTEINUR NEGATIVE 07/24/2017 1801   UROBILINOGEN 1.0 03/13/2015 0223   NITRITE NEGATIVE 07/24/2017 1801   LEUKOCYTESUR NEGATIVE 07/24/2017 1801    Radiological Exams on Admission: Mr Brain  W And Wo Contrast  Result Date: 07/30/2017 CLINICAL DATA:  Initial evaluation for possible multiple sclerosis flare. EXAM: MRI HEAD WITHOUT AND WITH CONTRAST TECHNIQUE: Multiplanar, multiecho pulse sequences of the brain and surrounding structures were obtained without and with intravenous contrast. CONTRAST:  20mL MULTIHANCE GADOBENATE DIMEGLUMINE 529 MG/ML IV SOLN COMPARISON:  Prior MRI from 05/11/2017. FINDINGS: Brain: Advanced cerebral atrophy for age, similar relative to previous exam. Extensive patchy and confluent T2/FLAIR signal seen throughout the periventricular, deep, and subcortical white matter both cerebral hemispheres, consistent with known history of multiple sclerosis. Overall, degree of white matter disease is stable the slightly progressed relative to previous. There are a few scattered patchy areas of restricted diffusion involving the periventricular white matter, most notable at the right aspect of the splenium, extending into the adjacent right occipital white matter (series 4, image 27). Additional mild restricted diffusion about a lesion at the superior left thalamus/periventricular  white matter (series 4, image 32). Faint enhancement seen about these lesions as well (series 15, image 28). Findings consistent with active demyelination. No other evidence for active demyelination. No evidence for acute infarct. Gray-white matter differentiation otherwise maintained. No mass lesion, mass effect, or midline shift. No hydrocephalus. No extra-axial fluid collection. No other abnormal enhancement. Pituitary gland suprasellar region normal. Midline structures intact. Vascular: Major intracranial vascular flow voids are maintained. Skull and upper cervical spine: Craniocervical junction within normal limits. Upper cervical spine normal. Bone marrow signal intensity within normal limits. No scalp soft tissue abnormality. Sinuses/Orbits: Globes and orbital soft tissues within normal limits. Moderate to advanced mucosal thickening throughout the ethmoidal air cells and maxillary sinuses, likely allergic/inflammatory in nature. No air-fluid level to suggest acute sinusitis. No mastoid effusion. Inner ear structures normal. Other: None. IMPRESSION: 1. Extensive cerebral white matter changes as above, consistent with history of demyelinating disease/multiple sclerosis. Restricted diffusion about a few lesions at the right splenium/occipital lobe and left thalamus/periventricular white matter, consistent with active demyelination. 2. Moderate allergic/inflammatory paranasal sinus disease. Electronically Signed   By: Rise Mu M.D.   On: 07/30/2017 03:40    EKG: Independently reviewed.  Assessment/Plan Principal Problem:   Multiple sclerosis exacerbation (HCC) Active Problems:   HTN (hypertension)   Paranoid schizophrenia (HCC)   Acute encephalopathy    1. MS flare - 1. Neurology consulted 1. Solumedrol 500mg  Q12H IV 2. Acute encephalopathy - 1. Heavy dose of trazodone vs MS flare 2. ABG pending to r/o hypercapnic respiratory failure 3. Not awake enough at the moment to try POs,  especially with the MRI lesions, though he does wake up enough to talk some with painful stimuli. 1. May improve as morning progresses as the trazodone wears off. 4. Will put on NS 150 cc/hr to prevent dehydration for now 5. SLP eval in AM 3. Paranoid schizophrenia - 1. Convert depacon to IV for now.  Looks like he already got 1gm load in ED. 2. SLP eval pending to see if he can take the rest of his PO meds 4. HTN - 1. Putting on PRN hydralazine if needed for BPs due to NPO status.  DVT prophylaxis: Lovenox Code Status: Full Family Communication: No family in room Disposition Plan: TBD Consults called: Neurology Admission status: Admit to inpatient - inpatient status for non-ambulatory patient due to severe MS flare, failed outpatient tysabari, currently NPO status due to needing swallow eval, etc.   Yovany Clock M. DO [facility] Pager [phone]  If 7AM-7PM, please contact day team taking care of patient [email or web address]  Password [email or web address]  07/30/2017, 5:23 AM

## 2017-07-30 NOTE — ED Provider Notes (Signed)
[name] Provider Note   CSN: [id number] Arrival date & time: 07/30/17  0024     History   Chief Complaint Chief Complaint  Patient presents with  . Multiple Sclerosis  . Fall    HPI [name] is a 27 y.o. male.  HPI  27 year old male comes in with chief complaint of fall / balance issues / altered mental status.  Pt lives with his family. LEVEL 5 CAVEAT FOR ALTERED MENTAL STATUS.  Mother ([name]) reports that when she woke up this AM he had increased balance issues. Pt however was able to hold conversation. Around 10 pm she noted that patient was having increased difficulty to move around, and at 11 pm patient couldn't get up at all and was urinating on himself.  Pt has hx of psych hx and takes trazadone - and he was given 3 trazadone tablets prior to he went to sleep. Family reports that patient continues to have verbal hallucinations due to schizophrenia.  Pt's neurologist had stopped tysabari q monthly 3 months ago as it was not working and they are considering starting patient on new meds. Pt's last flair up that required admission was in 2015.   Past Medical History:  Diagnosis Date  . ADHD (attention deficit hyperactivity disorder)   . Bipolar 1 disorder (HCC)   . Chronic back pain   . Chronic neck pain   . Hypertension   . Multiple sclerosis (HCC) 05/20/2013   left sided weakness, dysarthria  . Non-compliance   . Obesity   . Schizophrenia (HCC)   . 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 Paranoid schizophrenia (HCC)   . Hallucinations 08/05/2014  . Auditory hallucination   . Left-sided weakness 01/18/2014  . Multiple sclerosis (HCC) 05/20/2013  . 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 Abnormality of gait 11/23/2012  . HTN (hypertension) 07/04/2011  . 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 OBESITY 01/14/2010  . ADHD  01/14/2010    Past Surgical History:  Procedure Laterality Date  . None         Home Medications    Prior to Admission medications   Medication Sig Start Date End Date Taking? Authorizing Provider  amLODipine (NORVASC) 10 MG tablet Take 10 mg by mouth daily.    [provider]  benztropine (COGENTIN) 1 MG tablet Take 1 tablet (1 mg total) by mouth 2 (two) times daily. Patient taking differently: Take 1 mg by mouth daily.  04/02/15   Charm Rings, NP  cloZAPine (CLOZARIL) 25 MG tablet Take 25-37.5 mg by mouth 2 (two) times daily. 25 mg every morning, 37.5 mg every night    [provider]  divalproex (DEPAKOTE) 500 MG DR tablet Take 500 mg by mouth 2 (two) times daily.    [provider]  INVEGA SUSTENNA 234 MG/1.5ML SUSP injection Inject 234 mg into the muscle every 30 (thirty) days.  05/15/17   [provider]  Melatonin 5 MG TABS Take 10 mg by mouth at bedtime.     [provider]  predniSONE (DELTASONE) 10 MG tablet Begin taking 6 tablets daily, taper by one tablet every other day until off the medication. Patient not taking: Reported on 07/24/2017 07/02/17   York Spaniel, MD  propranolol (INDERAL) 10 MG tablet Take 1 tablet (10 mg total) by mouth 2 (two) times daily. 05/03/15  Charm Rings, NP  traZODone (DESYREL) 100 MG tablet Take 200 mg by mouth at bedtime as needed for sleep.  05/14/15   [provider]    Family History Family History  Problem Relation Age of Onset  . Diabetes Mother   . ADD / ADHD Brother     Social History Social History   Tobacco Use  . Smoking status: Current Every Day Smoker    Packs/day: 0.25    Types: Cigarettes  . Smokeless tobacco: Never Used  . Tobacco comment: 4 cigarettes a day  Substance Use Topics  . Alcohol use: Yes    Alcohol/week: 0.0 oz    Comment: "A little bit"   . Drug use: Yes    Types: Marijuana    Comment: Last used: unknown      Allergies   Patient  has no known allergies.   Review of Systems Review of Systems  Unable to perform ROS: Mental status change     Physical Exam Updated Vital Signs BP 129/80   Pulse 70   Temp (!) 97.4 F (36.3 C) (Oral)   Resp (!) 0   SpO2 100%   Physical Exam  Constitutional: He appears well-developed.  HENT:  Head: Atraumatic.  Eyes: Pupils are equal, round, and reactive to light.  Pupils are 1 mm and equal  Nursing note and vitals reviewed.    ED Treatments / Results  Labs (all labs ordered are listed, but only abnormal results are displayed) Labs Reviewed  COMPREHENSIVE METABOLIC PANEL - Abnormal; Notable for the following components:      Result Value   Glucose, Bld 103 (*)    All other components within normal limits  CBC WITH DIFFERENTIAL/PLATELET - Abnormal; Notable for the following components:   MCH 25.2 (*)    RDW 15.7 (*)    All other components within normal limits  VALPROIC ACID LEVEL - Abnormal; Notable for the following components:   Valproic Acid Lvl <10 (*)    All other components within normal limits  CK  URINALYSIS, COMPLETE (UACMP) WITH MICROSCOPIC    EKG  EKG Interpretation None       Radiology Mr Laqueta Jean And Wo Contrast  Result Date: 07/30/2017 CLINICAL DATA:  Initial evaluation for possible multiple sclerosis flare. EXAM: MRI HEAD WITHOUT AND WITH CONTRAST TECHNIQUE: Multiplanar, multiecho pulse sequences of the brain and surrounding structures were obtained without and with intravenous contrast. CONTRAST:  20mL MULTIHANCE GADOBENATE DIMEGLUMINE 529 MG/ML IV SOLN COMPARISON:  Prior MRI from 05/11/2017. FINDINGS: Brain: Advanced cerebral atrophy for age, similar relative to previous exam. Extensive patchy and confluent T2/FLAIR signal seen throughout the periventricular, deep, and subcortical white matter both cerebral hemispheres, consistent with known history of multiple sclerosis. Overall, degree of white matter disease is stable the slightly progressed  relative to previous. There are a few scattered patchy areas of restricted diffusion involving the periventricular white matter, most notable at the right aspect of the splenium, extending into the adjacent right occipital white matter (series 4, image 27). Additional mild restricted diffusion about a lesion at the superior left thalamus/periventricular white matter (series 4, image 32). Faint enhancement seen about these lesions as well (series 15, image 28). Findings consistent with active demyelination. No other evidence for active demyelination. No evidence for acute infarct. Gray-white matter differentiation otherwise maintained. No mass lesion, mass effect, or midline shift. No hydrocephalus. No extra-axial fluid collection. No other abnormal enhancement. Pituitary gland suprasellar region normal. Midline structures intact. Vascular: Major  intracranial vascular flow voids are maintained. Skull and upper cervical spine: Craniocervical junction within normal limits. Upper cervical spine normal. Bone marrow signal intensity within normal limits. No scalp soft tissue abnormality. Sinuses/Orbits: Globes and orbital soft tissues within normal limits. Moderate to advanced mucosal thickening throughout the ethmoidal air cells and maxillary sinuses, likely allergic/inflammatory in nature. No air-fluid level to suggest acute sinusitis. No mastoid effusion. Inner ear structures normal. Other: None. IMPRESSION: 1. Extensive cerebral white matter changes as above, consistent with history of demyelinating disease/multiple sclerosis. Restricted diffusion about a few lesions at the right splenium/occipital lobe and left thalamus/periventricular white matter, consistent with active demyelination. 2. Moderate allergic/inflammatory paranasal sinus disease. Electronically Signed   By: Rise Mu M.D.   On: 07/30/2017 03:40    Procedures Procedures (including critical care time)  CRITICAL CARE Performed by: Anthonny Schiller   Total critical care time: 43 minutes for MS flareup requiring IV steroids, and severe mental status change.  Critical care time was exclusive of separately billable procedures and treating other patients.  Critical care was necessary to treat or prevent imminent or life-threatening deterioration.  Critical care was time spent personally by me on the following activities: development of treatment plan with patient and/or surrogate as well as nursing, discussions with consultants, evaluation of patient's response to treatment, examination of patient, obtaining history from patient or surrogate, ordering and performing treatments and interventions, ordering and review of laboratory studies, ordering and review of radiographic studies, pulse oximetry and re-evaluation of patient's condition.   Medications Ordered in ED Medications  lactated ringers infusion (not administered)  methylPREDNISolone sodium succinate (SOLU-MEDROL) 500 mg in sodium chloride 0.9 % 50 mL IVPB (not administered)  gadobenate dimeglumine (MULTIHANCE) injection 20 mL (20 mLs Intravenous Contrast Given 07/30/17 0316)     Initial Impression / Assessment and Plan / ED Course  I have reviewed the triage vital signs and the nursing notes.  Pertinent labs & imaging results that were available during my care of the patient were reviewed by me and considered in my medical decision making (see chart for details).  Clinical Course as of Jul 30 449  Thu Jul 30, 2017  1610 MRI shows active demyelination.  Spoke with Dr. Onalee Hua, recommends 500 twice daily IV Solu-Medrol.  [facility] to [facility] to [name].  Repeat reassessment does not show any significant change in mental status.  Depakote level is low, therefore seizure is also possible.  We will give IV Depakote right now. MR Brain W and Wo Contrast [AN]    Clinical Course User Index [AN] Derwood Kaplan, MD    27 year old with history of  schizophrenia, MS comes in via EMS with chief complaint of altered mental status and fall. Patient is noted to be moving all 4 extremities, otherwise he is not providing any meaningful history.  I spoke with the mother who reports that patient started having some ataxia earlier today followed by progressive worsening weakness, to the point where patient was unable to walk.  Patient was given trazodone prior to ED arrival, and he is noted to be very sleepy right now.  Suspicion is high for MS flareup, medication side effect, status epilepticus.  Brain bleed is low in the differential diagnosis.  MRI ordered.  Final Clinical Impressions(s) / ED Diagnoses   Final diagnoses:  Multiple sclerosis [name])  Stupor    ED Discharge Orders    None       Derwood Kaplan, MD 07/30/17 [phone]

## 2017-07-31 DIAGNOSIS — I050 Rheumatic mitral stenosis: Secondary | ICD-10-CM

## 2017-07-31 DIAGNOSIS — G4700 Insomnia, unspecified: Secondary | ICD-10-CM

## 2017-07-31 DIAGNOSIS — Z818 Family history of other mental and behavioral disorders: Secondary | ICD-10-CM

## 2017-07-31 DIAGNOSIS — F17210 Nicotine dependence, cigarettes, uncomplicated: Secondary | ICD-10-CM

## 2017-07-31 DIAGNOSIS — F1290 Cannabis use, unspecified, uncomplicated: Secondary | ICD-10-CM

## 2017-07-31 DIAGNOSIS — R109 Unspecified abdominal pain: Secondary | ICD-10-CM

## 2017-07-31 DIAGNOSIS — Z560 Unemployment, unspecified: Secondary | ICD-10-CM

## 2017-07-31 DIAGNOSIS — R739 Hyperglycemia, unspecified: Secondary | ICD-10-CM | POA: Diagnosis not present

## 2017-07-31 LAB — BASIC METABOLIC PANEL
Anion gap: 12 (ref 5–15)
BUN: 14 mg/dL (ref 6–20)
CO2: 23 mmol/L (ref 22–32)
Calcium: 9 mg/dL (ref 8.9–10.3)
Chloride: 102 mmol/L (ref 101–111)
Creatinine, Ser: 0.89 mg/dL (ref 0.61–1.24)
GFR calc Af Amer: >60 mL/min (ref >60)
GFR calc non Af Amer: >60 mL/min (ref >60)
Glucose, Bld: 188 mg/dL — ABNORMAL HIGH (ref 65–99)
Potassium: 4.5 mmol/L (ref 3.5–5.1)
Sodium: 137 mmol/L (ref 135–145)

## 2017-07-31 LAB — GLUCOSE, CAPILLARY
Glucose-Capillary: 290 mg/dL — ABNORMAL HIGH (ref 65–99)
Glucose-Capillary: 370 mg/dL — ABNORMAL HIGH (ref 65–99)

## 2017-07-31 LAB — HEMOGLOBIN A1C
Hgb A1c MFr Bld: 5.9 % — ABNORMAL HIGH (ref 4.8–5.6)
Mean Plasma Glucose: 122.63 mg/dL

## 2017-07-31 MED ORDER — INSULIN ASPART 100 UNIT/ML ~~LOC~~ SOLN
6.0000 [IU] | Freq: Once | SUBCUTANEOUS | Status: AC
  Administered 2017-08-01: 6 [IU] via SUBCUTANEOUS

## 2017-07-31 MED ORDER — TRAZODONE HCL 100 MG PO TABS
300.0000 mg | ORAL_TABLET | Freq: Every evening | ORAL | Status: DC | PRN
  Administered 2017-08-01 – 2017-08-04 (×3): 300 mg via ORAL
  Filled 2017-07-31 (×3): qty 3

## 2017-07-31 MED ORDER — INSULIN ASPART 100 UNIT/ML ~~LOC~~ SOLN
0.0000 [IU] | Freq: Three times a day (TID) | SUBCUTANEOUS | Status: DC
  Administered 2017-07-31: 5 [IU] via SUBCUTANEOUS
  Administered 2017-07-31: 9 [IU] via SUBCUTANEOUS
  Administered 2017-08-01: 2 [IU] via SUBCUTANEOUS
  Administered 2017-08-01 (×2): 3 [IU] via SUBCUTANEOUS
  Administered 2017-08-02: 5 [IU] via SUBCUTANEOUS
  Administered 2017-08-02: 7 [IU] via SUBCUTANEOUS
  Administered 2017-08-02 – 2017-08-03 (×2): 3 [IU] via SUBCUTANEOUS
  Administered 2017-08-03: 5 [IU] via SUBCUTANEOUS
  Administered 2017-08-03: 2 [IU] via SUBCUTANEOUS
  Administered 2017-08-04: 7 [IU] via SUBCUTANEOUS
  Administered 2017-08-04: 2 [IU] via SUBCUTANEOUS
  Administered 2017-08-04: 5 [IU] via SUBCUTANEOUS
  Administered 2017-08-05: 2 [IU] via SUBCUTANEOUS
  Administered 2017-08-05: 5 [IU] via SUBCUTANEOUS

## 2017-07-31 NOTE — Consult Note (Addendum)
[facility] Consult   Reason for Consult:  Brandon [name]  Referring Physician:  Dr. [place] Pink Patient Identification: Brandon [name] MRN:  [id number] Principal Diagnosis: Paranoid schizophrenia [facility]) Diagnosis:   Patient Active Problem List   Diagnosis Date Noted  . Hyperglycemia [R73.9] 07/31/2017  . Multiple sclerosis exacerbation ([place]) [G35] 07/30/2017  . Acute encephalopathy [G93.40] 07/30/2017  . Paranoid schizophrenia ([place]) [F20.0]   . Hallucinations [R44.3] 08/05/2014  . Auditory hallucination [R44.0]   . Left-sided weakness [R53.1] 01/18/2014  . Multiple sclerosis ([place]) [G35] 05/20/2013  . White matter abnormality on MRI of brain [R90.82] 11/23/2012  . Abnormality of gait [R26.9] 11/23/2012  . HTN (hypertension) [I10] 07/04/2011  . Ataxia [R27.0] 07/02/2011  . OBESITY [E66.9] 01/14/2010  . ADHD [F90.9] 01/14/2010    Total Time spent with patient: 1 hour  Subjective:   Brandon [name] is a 27 y.o. male patient admitted with acute encephalopathy as well as weakness in the setting of MS exacerbation.  HPI:   Per chart review, patient has a history of MS and was admitted for an exacerbation. He is receiving IV steroids. He has a history of paranoid schizophrenia. He continues to have AH. He was last seen in the [facility] on 1/21 for AH and shadows. He reported that these voices were egosyntonic at this time. He was psychiatrically cleared and discharged home. He is currently prescribed Cogentin 1 mg BID, Clozaril 25 mg BID and Trazodone 300 mg qhs PRN. Encephalopathy on admission was attributed to taking an extra tablet of Trazodone due to inability to sleep. UDS positive for THC.   On interview, Brandon [name] seeing shadows right before he sleeps at night. He also [name] hearing voices that tell him to do "positive" things. He [name] ignoring them at times. He denies feeling distressed by them. He would like for his Clozaril to be increased though. He [name]  compliance with his medications and denies side effects. He denies SI or HI. He denies problems with sleep or appetite.   Past Psychiatric History: ADHD. He denies a history of suicide attempts.   Risk to Self: Is patient at risk for suicide?: No Risk to Others:  None. Denies HI.  Prior Inpatient Therapy:  He was hospitalized at [facility] years ago for psychosis.  Prior Outpatient Therapy:  He is followed at [facility] and has a PSI team.   Past Medical History:  Past Medical History:  Diagnosis Date  . ADHD (attention deficit hyperactivity disorder)   . Bipolar 1 disorder ([place])   . Chronic back pain   . Chronic neck pain   . Hypertension   . Multiple sclerosis ([place]) 05/20/2013   left sided weakness, dysarthria  . Non-compliance   . Obesity   . Schizophrenia ([place])   . White matter abnormality on MRI of brain 11/23/2012    Past Surgical History:  Procedure Laterality Date  . None     Family History:  Family History  Problem Relation Age of Onset  . Diabetes Mother   . ADD / ADHD Brother    Family Psychiatric  History: Fraternal grandfather-schizophrenia and brother-ADHD.   Social History:  Social History   Substance and Sexual Activity  Alcohol Use Yes  . Alcohol/week: 0.0 oz   Comment: "A little bit"      Social History   Substance and Sexual Activity  Drug Use Yes  . Types: Marijuana   Comment: Last used: unknown     Social History   Socioeconomic  History  . Marital status: Single    Spouse name: None  . Number of children: 0  . Years of education: 11th  . Highest education level: None  Social Needs  . Financial resource strain: None  . Food insecurity - worry: None  . Food insecurity - inability: None  . Transportation needs - medical: None  . Transportation needs - non-medical: None  Occupational History    Employer: Chief of Staff    Comment: Disbaled  Tobacco Use  . Smoking status: Current Every Day Smoker    Packs/day: 0.25    Types:  Cigarettes  . Smokeless tobacco: Never Used  . Tobacco comment: 4 cigarettes a day  Substance and Sexual Activity  . Alcohol use: Yes    Alcohol/week: 0.0 oz    Comment: "A little bit"   . Drug use: Yes    Types: Marijuana    Comment: Last used: unknown   . Sexual activity: None  Other Topics Concern  . None  Social History Narrative   Patient lives at home with his mother.   Disabled.   Education 11 th grade .   Right handed.   Caffeine - one cup daily soda.   Additional Social History: He lives at home with his mother. He receives SSDI for medical conditions. He is unemployed. He [name] occasionally smokes his uncle's "medical marijuana." He [name] social alcohol use.     Allergies:  No Known Allergies  Labs:  Results for orders placed or performed during the hospital encounter of 07/30/17 (from the past 48 hour(s))  Comprehensive metabolic panel     Status: Abnormal   Collection Time: 07/30/17  1:42 AM  Result Value Ref Range   Sodium 139 135 - 145 mmol/L   Potassium 4.1 3.5 - 5.1 mmol/L   Chloride 101 101 - 111 mmol/L   CO2 27 22 - 32 mmol/L   Glucose, Bld 103 (H) 65 - 99 mg/dL   BUN 17 6 - 20 mg/dL   Creatinine, Ser 1.20 0.61 - 1.24 mg/dL   Calcium 9.3 8.9 - 10.3 mg/dL   Total Protein 6.5 6.5 - 8.1 g/dL   Albumin 3.7 3.5 - 5.0 g/dL   AST 17 15 - 41 U/L   ALT 34 17 - 63 U/L   Alkaline Phosphatase 69 38 - 126 U/L   Total Bilirubin 0.5 0.3 - 1.2 mg/dL   GFR calc non Af Amer >60 >60 mL/min   GFR calc Af Amer >60 >60 mL/min    Comment: (NOTE) The eGFR has been calculated using the CKD EPI equation. This calculation has not been validated in all clinical situations. eGFR's persistently <60 mL/min signify possible Chronic Kidney Disease.    Anion gap 11 5 - 15  CBC WITH DIFFERENTIAL     Status: Abnormal   Collection Time: 07/30/17  1:42 AM  Result Value Ref Range   WBC 8.3 4.0 - 10.5 K/uL   RBC 5.23 4.22 - 5.81 MIL/uL   Hemoglobin 13.2 13.0 - 17.0 g/dL   HCT  42.0 39.0 - 52.0 %   MCV 80.3 78.0 - 100.0 fL   MCH 25.2 (L) 26.0 - 34.0 pg   MCHC 31.4 30.0 - 36.0 g/dL   RDW 15.7 (H) 11.5 - 15.5 %   Platelets 214 150 - 400 K/uL   Neutrophils Relative % 52 %   Neutro Abs 4.2 1.7 - 7.7 K/uL   Lymphocytes Relative 35 %   Lymphs Abs 2.9 0.7 -  4.0 K/uL   Monocytes Relative 8 %   Monocytes Absolute 0.7 0.1 - 1.0 K/uL   Eosinophils Relative 5 %   Eosinophils Absolute 0.4 0.0 - 0.7 K/uL   Basophils Relative 0 %   Basophils Absolute 0.0 0.0 - 0.1 K/uL  Valproic acid level     Status: Abnormal   Collection Time: 07/30/17  1:42 AM  Result Value Ref Range   Valproic Acid Lvl <10 (L) 50.0 - 100.0 ug/mL    Comment: RESULTS CONFIRMED BY MANUAL DILUTION  CK     Status: None   Collection Time: 07/30/17  1:42 AM  Result Value Ref Range   Total CK 155 49 - 397 U/L  Urinalysis, Complete w Microscopic     Status: Abnormal   Collection Time: 07/30/17  5:30 AM  Result Value Ref Range   Color, Urine STRAW (A) YELLOW   APPearance CLEAR CLEAR   Specific Gravity, Urine 1.009 1.005 - 1.030   pH 7.0 5.0 - 8.0   Glucose, UA NEGATIVE NEGATIVE mg/dL   Hgb urine dipstick NEGATIVE NEGATIVE   Bilirubin Urine NEGATIVE NEGATIVE   Ketones, ur NEGATIVE NEGATIVE mg/dL   Protein, ur NEGATIVE NEGATIVE mg/dL   Nitrite NEGATIVE NEGATIVE   Leukocytes, UA NEGATIVE NEGATIVE   RBC / HPF NONE SEEN 0 - 5 RBC/hpf   WBC, UA 0-5 0 - 5 WBC/hpf   Bacteria, UA NONE SEEN NONE SEEN   Squamous Epithelial / LPF NONE SEEN NONE SEEN  HIV antibody (Routine Testing)     Status: None   Collection Time: 07/30/17  5:50 AM  Result Value Ref Range   HIV Screen 4th Generation wRfx Non Reactive Non Reactive    Comment: (NOTE) Performed At: [facility] [place], [place] [id number] Rush Farmer MD [id number]   I-Stat arterial blood gas, ED     Status: Abnormal   Collection Time: 07/30/17  8:03 AM  Result Value Ref Range   pH, Arterial 7.404 7.350 - 7.450   pCO2  arterial 41.2 32.0 - 48.0 mmHg   pO2, Arterial 73.0 (L) 83.0 - 108.0 mmHg   Bicarbonate 25.8 20.0 - 28.0 mmol/L   TCO2 27 22 - 32 mmol/L   O2 Saturation 94.0 %   Acid-Base Excess 1.0 0.0 - 2.0 mmol/L   Patient temperature HIDE    Collection site RADIAL, ALLEN'S TEST ACCEPTABLE    Drawn by RT    Sample type ARTERIAL   Basic metabolic panel     Status: Abnormal   Collection Time: 07/31/17  4:31 AM  Result Value Ref Range   Sodium 137 135 - 145 mmol/L   Potassium 4.5 3.5 - 5.1 mmol/L   Chloride 102 101 - 111 mmol/L   CO2 23 22 - 32 mmol/L   Glucose, Bld 188 (H) 65 - 99 mg/dL   BUN 14 6 - 20 mg/dL   Creatinine, Ser 0.89 0.61 - 1.24 mg/dL   Calcium 9.0 8.9 - 10.3 mg/dL   GFR calc non Af Amer >60 >60 mL/min   GFR calc Af Amer >60 >60 mL/min    Comment: (NOTE) The eGFR has been calculated using the CKD EPI equation. This calculation has not been validated in all clinical situations. eGFR's persistently <60 mL/min signify possible Chronic Kidney Disease.    Anion gap 12 5 - 15  Hemoglobin A1c     Status: Abnormal   Collection Time: 07/31/17  9:00 AM  Result Value Ref Range   Hgb A1c  MFr Bld 5.9 (H) 4.8 - 5.6 %    Comment: (NOTE) Pre diabetes:          5.7%-6.4% Diabetes:              >6.4% Glycemic control for   <7.0% adults with diabetes    Mean Plasma Glucose 122.63 mg/dL  Glucose, capillary     Status: Abnormal   Collection Time: 07/31/17 12:21 PM  Result Value Ref Range   Glucose-Capillary 290 (H) 65 - 99 mg/dL    Current Facility-Administered Medications  Medication Dose Route Frequency Provider Last Rate Last Dose  . acetaminophen (TYLENOL) tablet 650 mg  650 mg Oral Q6H PRN Etta Quill, DO       Or  . acetaminophen (TYLENOL) suppository 650 mg  650 mg Rectal Q6H PRN Etta Quill, DO      . amLODipine (NORVASC) tablet 10 mg  10 mg Oral Daily Annita Brod, MD   10 mg at 07/31/17 0930  . benztropine (COGENTIN) tablet 1 mg  1 mg Oral BID Annita Brod, MD   1 mg at 07/31/17 0930  . cloZAPine (CLOZARIL) tablet 25 mg  25 mg Oral BID Annita Brod, MD   25 mg at 07/31/17 0930  . enoxaparin (LOVENOX) injection 40 mg  40 mg Subcutaneous Q24H Jennette Kettle M, DO   40 mg at 07/30/17 1736  . hydrALAZINE (APRESOLINE) injection 10 mg  10 mg Intravenous Q4H PRN Etta Quill, DO      . insulin aspart (novoLOG) injection 0-9 Units  0-9 Units Subcutaneous TID WC Annita Brod, MD   5 Units at 07/31/17 1238  . methylPREDNISolone sodium succinate (SOLU-MEDROL) 500 mg in sodium chloride 0.9 % 50 mL IVPB  500 mg Intravenous Q12H Etta Quill, DO   Stopped at 07/31/17 [phone]  . nicotine (NICODERM CQ - dosed in mg/24 hr) patch 7 mg  7 mg Transdermal Daily Annita Brod, MD   7 mg at 07/31/17 0930  . ondansetron (ZOFRAN) tablet 4 mg  4 mg Oral Q6H PRN Etta Quill, DO       Or  . ondansetron [facility]) injection 4 mg  4 mg Intravenous Q6H PRN Etta Quill, DO      . propranolol (INDERAL) tablet 10 mg  10 mg Oral BID Annita Brod, MD   10 mg at 07/31/17 0930  . traZODone (DESYREL) tablet 300 mg  300 mg Oral QHS PRN Annita Brod, MD      . valproate (DEPACON) 500 mg in dextrose 5 % 50 mL IVPB  500 mg Intravenous Q12H Etta Quill, DO   Stopped at 07/31/17 0728    Musculoskeletal: Strength & Muscle Tone: within normal limits Gait & Station: UTA since patient was sitting in a chair.  Patient leans: N/A  Psychiatric Specialty Exam: Physical Exam  Nursing note and vitals reviewed. Constitutional: He is oriented to person, place, and time. He appears well-developed and well-nourished.  HENT:  Head: Normocephalic and atraumatic.  Neck: Normal range of motion.  Respiratory: Effort normal.  Musculoskeletal: Normal range of motion.  Neurological: He is alert and oriented to person, place, and time.  Skin: No rash noted.  Psychiatric: His behavior is normal. Judgment normal. His affect is blunt. His speech is slurred.  Thought content is delusional. Cognition and memory are normal.    Review of Systems  Constitutional: Negative for chills and fever.  Gastrointestinal: Positive for abdominal  pain. Negative for constipation, diarrhea, nausea and vomiting.  Psychiatric/Behavioral: Positive for hallucinations (AVH) and substance abuse. Negative for depression and suicidal ideas. The patient is not nervous/anxious and does not have insomnia.   All other systems reviewed and are negative.   Blood pressure (!) 147/112, pulse 83, temperature (!) 97.5 F (36.4 C), temperature source Oral, resp. rate 20, height _0  (1.854 m), weight (!) 142.1 kg (313 lb 4.4 oz), SpO2 100 %.Body mass index is 41.33 kg/m.  General Appearance: Fairly Groomed, overweight, African American male, wearing a hospital gown with food in his beard. NAD.   Eye Contact:  Good  Speech:  Normal Rate and Slurred  Volume:  Normal  Mood:  "Okay"  Affect:  Blunt  Thought Process:  Linear  Orientation:  Full (Time, Place, and Person)  Thought Content:  Delusions and Hallucinations: Auditory Visual  Suicidal Thoughts:  No  Homicidal Thoughts:  No  Memory:  Immediate;   Good Recent;   Good Remote;   Good  Judgement:  Fair  Insight:  Fair  Psychomotor Activity:  Normal  Concentration:  Concentration: Good and Attention Span: Good  Recall:  Good  Fund of Knowledge:  Fair  Language:  Fair  Akathisia:  No  Handed:  Right  AIMS (if indicated):   N/A  Assets:  Housing Social Support  ADL's:  Intact  Cognition:  WNL  Sleep:   Fair   Assessment:  HASKELL RIHN is a 27 y.o. male who was admitted with acute encephalopathy as well as weakness in the setting of MS exacerbation. He [name] AH that are egosyntonic as well as VH of shadows although he is agreeable to increasing Clozaril. He denies SI or HI. He does not warrant inpatient psychiatric hospitalization at this time.    Treatment Plan Summary: -Continue home Cogentin 1 mg BID and  Trazodone 300 qhs PRN.  -Increase home Clozaril 25 mg BID to 25 mg q am and 50 mg qhs.  -Patient should follow up with his outpatient mental health provider upon discharge. -Patient is psychiatrically cleared. Psychiatry will sign off on patient at this time. Please consult psychiatry again as needed.   Disposition: No evidence of imminent risk to self or others at present.   Patient does not meet criteria for psychiatric inpatient admission.  Faythe Dingwall, DO 07/31/2017 1:02 PM

## 2017-07-31 NOTE — Progress Notes (Signed)
Subjective: No complaints at this time  Exam: Vitals:   07/30/17 2105 07/31/17 0623  BP: (!) 153/82 134/60  Pulse: 78 83  Resp: (!) 21 20  Temp: (!) 97.3 F (36.3 C) (!) 97.5 F (36.4 C)  SpO2: 97% 95%    Physical Exam   HEENT-  Normocephalic, no lesions, without obvious abnormality.  Normal external eye and conjunctiva.   Cardiovascular- S1-S2 audible, pulses palpable throughout   Lungs-no rhonchi or wheezing noted, no excessive working breathing.  Saturations within normal limits Abdomen- All 4 quadrants palpated and nontender Extremities- Warm, dry and intact Musculoskeletal-no joint tenderness, deformity or swelling Skin-warm and dry, no hyperpigmentation, vitiligo, or suspicious lesions    Neuro:  Mental Status: Alert, oriented, thought content appropriate.  Speech dysarthric without evidence of aphasia.  Able to follow 3 step commands without difficulty. Cranial Nerves: II: Discs flat bilaterally; Visual fields grossly normal,  III,IV, VI: ptosis not present, extra-ocular motions intact bilaterally pupils equal, round, reactive to light and accommodation V,VII: smile symmetric, facial light touch sensation normal bilaterally VIII: hearing normal bilaterally IX,X: uvula rises symmetrically XI: bilateral shoulder shrug XII: midline tongue extension Motor: Right : Upper extremity   5/5    Left:     Upper extremity   5/5  Lower extremity   5/5     Lower extremity   5/5 Tone and bulk:normal tone throughout; no atrophy noted Sensory: Pinprick and light touch intact throughout, bilaterally Deep Tendon Reflexes: Depressed throughout Plantars: Right: downgoing   Left: downgoing      Medications:  Scheduled: . amLODipine  10 mg Oral Daily  . benztropine  1 mg Oral BID  . cloZAPine  25 mg Oral BID  . enoxaparin (LOVENOX) injection  40 mg Subcutaneous Q24H  . insulin aspart  0-9 Units Subcutaneous TID WC  . nicotine  7 mg Transdermal Daily  . propranolol  10 mg  Oral BID   Continuous: . methylPREDNISolone (SOLU-MEDROL) injection Stopped (07/31/17 1610)  . valproate sodium Stopped (07/31/17 0728)   RUE:[id number] **OR** acetaminophen, hydrALAZINE, ondansetron **OR** ondansetron (ZOFRAN) IV  Pertinent Labs/Diagnostics:   Mr Laqueta Jean And Wo Contrast  Result Date: 07/30/2017 CLINICAL DATA:  Initial evaluation for possible multiple sclerosis flare. EXAM: MRI HEAD WITHOUT AND WITH CONTRAST TECHNIQUE: Multiplanar, multiecho pulse sequences of the brain and surrounding structures were obtained without and with intravenous contrast. CONTRAST:  20mL MULTIHANCE GADOBENATE DIMEGLUMINE 529 MG/ML IV SOLN COMPARISON:  Prior MRI from 05/11/2017. FINDINGS: Brain: Advanced cerebral atrophy for age, similar relative to previous exam. Extensive patchy and confluent T2/FLAIR signal seen throughout the periventricular, deep, and subcortical white matter both cerebral hemispheres, consistent with known history of multiple sclerosis. Overall, degree of white matter disease is stable the slightly progressed relative to previous. There are a few scattered patchy areas of restricted diffusion involving the periventricular white matter, most notable at the right aspect of the splenium, extending into the adjacent right occipital white matter (series 4, image 27). Additional mild restricted diffusion about a lesion at the superior left thalamus/periventricular white matter (series 4, image 32). Faint enhancement seen about these lesions as well (series 15, image 28). Findings consistent with active demyelination. No other evidence for active demyelination. No evidence for acute infarct. Gray-white matter differentiation otherwise maintained. No mass lesion, mass effect, or midline shift. No hydrocephalus. No extra-axial fluid collection. No other abnormal enhancement. Pituitary gland suprasellar region normal. Midline structures intact. Vascular: Major intracranial vascular flow voids  are maintained. Skull and upper  cervical spine: Craniocervical junction within normal limits. Upper cervical spine normal. Bone marrow signal intensity within normal limits. No scalp soft tissue abnormality. Sinuses/Orbits: Globes and orbital soft tissues within normal limits. Moderate to advanced mucosal thickening throughout the ethmoidal air cells and maxillary sinuses, likely allergic/inflammatory in nature. No air-fluid level to suggest acute sinusitis. No mastoid effusion. Inner ear structures normal. Other: None. IMPRESSION: 1. Extensive cerebral white matter changes as above, consistent with history of demyelinating disease/multiple sclerosis. Restricted diffusion about a few lesions at the right splenium/occipital lobe and left thalamus/periventricular white matter, consistent with active demyelination. 2. Moderate allergic/inflammatory paranasal sinus disease. Electronically Signed   By: Rise Mu M.D.   On: 07/30/2017 03:40   Felicie Morn PA-C [facility] [phone]  A: 27 year old male with active demyelination noted by imaging on MRI with symptoms of worsening speech and worsening gait.  On exam today patient had full strength in all extremities but still some dysarthria.  IMP: MS Exacerbation  Recommendations: -Continue Solu-Medrol 500mg  BID x5 days total.  Today is day 2 -PT/OT -Follow-up with Dr. Anne Hahn at [facility] neurology, who according to the patient already has plans to start him on disease modifying medication Ocrevus, which will be done as an outpatient. -No hallucinations reported today.  If continues to have hallucinations, consider psychiatry consult.  Neurology service will be available as needed.  Please call with questions.  07/31/2017, 9:15 AM  Attending Neurohospitalist Addendum Patient seen and examined with APP/Resident. Agree with the history and physical as documented above. Agree with the plan as documented, which I helped  formulate. I have independently reviewed the chart, obtained history, review of systems and examined the patient.I have personally reviewed pertinent head/neck/spine imaging (CT/MRI). Please feel free to call with any questions. --- Milon Dikes, MD [facility] Pager: [phone]  If 7pm to 7am, please call on call as listed on AMION.

## 2017-07-31 NOTE — Evaluation (Signed)
Clinical/Bedside Swallow Evaluation Patient Details  Name: Brandon [name] MRN: [id number] Date of Birth: Sep 16, 1990  Today's Date: 07/31/2017 Time: SLP Start Time (ACUTE ONLY): 1009 SLP Stop Time (ACUTE ONLY): 1025 SLP Time Calculation (min) (ACUTE ONLY): 16 min  Past Medical History:  Past Medical History:  Diagnosis Date  . ADHD (attention deficit hyperactivity disorder)   . Bipolar 1 disorder (HCC)   . Chronic back pain   . Chronic neck pain   . Hypertension   . Multiple sclerosis (HCC) 05/20/2013   left sided weakness, dysarthria  . Non-compliance   . Obesity   . Schizophrenia (HCC)   . White matter abnormality on MRI of brain 11/23/2012   Past Surgical History:  Past Surgical History:  Procedure Laterality Date  . None     HPI:  Brandon [name] a 26 y.o.malewith medical history significant ofMS, schizophrenia. [name] provides history due to AMS of patient.  Mother (Ms. Brandon [name]) reports that when she woke up this AM he had increased balance issues. Pt however was able to hold conversation. Around 10 pm she noted that patient was having increased difficulty to move around, and at 11 pm patient couldn't get up at all and was urinating on himself.  Pt has hx ofschizophreniaand takestrazodone,he was given 3 trazadone tablets prior to he went to sleep.(Normally supposed to take 2 tablets apparently).Family reports that patient continues to have auditoryhallucinations due to schizophrenia.  MRI of the head is showing extensive cerebral white matter changes c/w known MS.  Chest xray showing clear lungs.     Assessment / Plan / Recommendation Clinical Impression  Clinical swallowing evaluation was completed in setting of MS flare.  The patient reported no trouble swallowing at home prior to admission nor any respiratory issues.  Oral mechanism exam was completed and remarkable for decreased jaw opening on the left side with good strength, mildly decreased  lingual strength and decreased movement of the left side of his face only above the eyebrow.  The patient was given thin liquids via spoon, cup and straw, pureed material and dry solids.  He presented with a functional oral and pharyngeal swallow.  Swallow trigger appeared to be timely and hyo-laryngeal excursion was appreciated to palpation.  Mastication of dry solids appeared to be functional.  No overt s/s of aspiration was seen.  In addition, the patient passed the [facility] Swallowing Protocol suggesting the low likelihood of aspiration.  Recommend a regular diet with thin liquids.  The patient was educated about the possibility of swallowing issues in the setting of progressive MS.  ST follow up is not indicated at this time.      Aspiration Risk    Mild   Diet Recommendation   Regular with thin liquids  Medication Administration: Whole meds with liquid    Other  Recommendations Oral Care Recommendations: Oral care BID   Follow up Recommendations None        Swallow Study   General Date of Onset: 07/30/17 HPI: Brandon [name] a 26 y.o.malewith medical history significant ofMS, schizophrenia. [name] provides history due to AMS of patient.  Mother (Ms. Brandon [name]) reports that when she woke up this AM he had increased balance issues. Pt however was able to hold conversation. Around 10 pm she noted that patient was having increased difficulty to move around, and at 11 pm patient couldn't get up at all and was urinating on himself.  Pt has hx ofschizophreniaand takestrazodone,he was given 3 trazadone  tablets prior to he went to sleep.(Normally supposed to take 2 tablets apparently).Family reports that patient continues to have auditoryhallucinations due to schizophrenia.  MRI of the head is showing extensive cerebral white matter changes c/w known MS.  Chest xray showing clear lungs.   Type of Study: Bedside Swallow Evaluation Previous Swallow Assessment: None noted as  MCH. Diet Prior to this Study: Regular;Thin liquids Temperature Spikes Noted: No Respiratory Status: Room air History of Recent Intubation: No Behavior/Cognition: Alert;Cooperative;Pleasant mood Oral Cavity Assessment: Within Functional Limits Oral Care Completed by SLP: No Oral Cavity - Dentition: Adequate natural dentition Vision: Functional for self-feeding Self-Feeding Abilities: Able to feed self Patient Positioning: Upright in bed Baseline Vocal Quality: Normal Volitional Cough: Strong Volitional Swallow: Able to elicit    Oral/Motor/Sensory Function Overall Oral Motor/Sensory Function: Mild impairment Facial ROM: Within Functional Limits Facial Symmetry: Abnormal symmetry left Facial Strength: Within Functional Limits Lingual ROM: Within Functional Limits Lingual Symmetry: Within Functional Limits Lingual Strength: Reduced Mandible: Within Functional Limits   Ice Chips Ice chips: Not tested   Thin Liquid Thin Liquid: Within functional limits Presentation: Cup;Spoon;Straw;Self Fed    Nectar Thick Nectar Thick Liquid: Not tested   Honey Thick Honey Thick Liquid: Not tested   Puree Puree: Within functional limits Presentation: Spoon   Solid   GO   Solid: Within functional limits Presentation: Self Fed        Brandon Aguas, MA, CCC-SLP Acute Rehab SLP [phone] Fleet Contras 07/31/2017,10:32 AM

## 2017-07-31 NOTE — Progress Notes (Addendum)
PROGRESS NOTE  Brandon [name] WRU:[id number] DOB: 1991-06-24 DOA: 07/30/2017 PCP: Devra Dopp, MD  HPI/Recap of past 45 hours: 27 year old male with past medical history of schizophrenia, bipolar disorder and multiple sclerosis admitted for acute encephalopathy as well as weakness felt to be secondary to MS exacerbation area confusion felt to be secondary to extra trazodone patient had taken for sleep. Started on IV steroids and admitted to the hospitalist service.  Encephalopathy resolved soon after admission, felt to be secondary to excessive trazodone.   No events overnight.patient sleepy this morning, no complaints  Assessment/Plan: Principal Problem:   Multiple sclerosis exacerbation (HCC): Continue IV steroids, day 2/5. Patient continues to do well. PT will follow. Neurology following. Seen by speech therapy and cleared swallow eval Active Problems:   OBESITY: Patient meets criteria with BMI greater than 30   HTN (hypertension)   Paranoid schizophrenia (HCC)/History of bipolar disorder: Continues to have auditory hallucinations. Recently discharged from behavioral health. Psychiatry has been consulted   Acute encephalopathy: Resolved. Secondary toextra trazodone dose he took. He'll trazodone altogether the patient had restless night last night and slightly sleepy this morning. We'll restart trazodone Hyperglycemia: CBGs at 180. A1c however 5.9. This is secondary steroids only, no actual diabetes. Cover with sliding scale.   Code Status: full code   Family Communication: mom at the bedside   Disposition Plan: Home on Monday    Consultants:  Psychiatry   Neuro   Procedures:  None   Antimicrobials:   none   DVT prophylaxis:  Lovenox   Objective: Vitals:   07/30/17 1351 07/30/17 2105 07/31/17 0623 07/31/17 0929  BP: 136/76 (!) 153/82 134/60 (!) 147/112  Pulse: 91 78 83   Resp:  (!) 21 20 20   Temp: 98 F (36.7 C) (!) 97.3 F (36.3 C) (!) 97.5 F (36.4 C)  (!) 97.5 F (36.4 C)  TempSrc: Oral Oral Oral Oral  SpO2: 100% 97% 95% 100%  Weight: (!) 142.1 kg (313 lb 4.4 oz)     Height: 6\' 1"  (1.854 m)       Intake/Output Summary (Last 24 hours) at 07/31/2017 1230 Last data filed at 07/31/2017 1100 Gross per 24 hour  Intake 4683.08 ml  Output 2855 ml  Net 1828.08 ml   Filed Weights   07/30/17 1351  Weight: (!) 142.1 kg (313 lb 4.4 oz)    Exam:   General:  Somnolent, able to be awoken, no acute distress   Cardiovascular: Regular rate and rhythm, S1-S2   Respiratory: clear to auscultation bilaterally   Abdomen: Soft, obese, nontender, positive bowel sounds   Musculoskeletal: No clubbing or cyanosis, trace pitting edema   Skin: No skin breaks, tears or lesions  Psychiatry: Appropriate, does have occasional auditory hallucinations    Data Reviewed: CBC: Recent Labs  Lab 07/24/17 1801 07/30/17 0142  WBC 8.0 8.3  NEUTROABS 4.1 4.2  HGB 12.5* 13.2  HCT 40.1 42.0  MCV 80.7 80.3  PLT 189 214   Basic Metabolic Panel: Recent Labs  Lab 07/24/17 1801 07/30/17 0142 07/31/17 0431  NA 138 139 137  K 3.8 4.1 4.5  CL 103 101 102  CO2 29 27 23   GLUCOSE 88 103* 188*  BUN 13 17 14   CREATININE 0.93 1.20 0.89  CALCIUM 9.1 9.3 9.0   GFR: Estimated Creatinine Clearance: 188.1 mL/min (by C-G formula based on SCr of 0.89 mg/dL). Liver Function Tests: Recent Labs  Lab 07/24/17 1801 07/30/17 0142  AST 15 17  ALT 20 34  ALKPHOS 73 69  BILITOT 0.3 0.5  PROT 7.1 6.5  ALBUMIN 3.9 3.7   No results for input(s): LIPASE, AMYLASE in the last 168 hours. No results for input(s): AMMONIA in the last 168 hours. Coagulation Profile: No results for input(s): INR, PROTIME in the last 168 hours. Cardiac Enzymes: Recent Labs  Lab 07/30/17 0142  CKTOTAL 155   BNP (last 3 results) No results for input(s): PROBNP in the last 8760 hours. HbA1C: Recent Labs    07/31/17 0900  HGBA1C 5.9*   CBG: Recent Labs  Lab 07/31/17 1221    GLUCAP 290*   Lipid Profile: No results for input(s): CHOL, HDL, LDLCALC, TRIG, CHOLHDL, LDLDIRECT in the last 72 hours. Thyroid Function Tests: No results for input(s): TSH, T4TOTAL, FREET4, T3FREE, THYROIDAB in the last 72 hours. Anemia Panel: No results for input(s): VITAMINB12, FOLATE, FERRITIN, TIBC, IRON, RETICCTPCT in the last 72 hours. Urine analysis:    Component Value Date/Time   COLORURINE STRAW (A) 07/30/2017 0530   APPEARANCEUR CLEAR 07/30/2017 0530   LABSPEC 1.009 07/30/2017 0530   PHURINE 7.0 07/30/2017 0530   GLUCOSEU NEGATIVE 07/30/2017 0530   HGBUR NEGATIVE 07/30/2017 0530   HGBUR negative 01/14/2010 1451   BILIRUBINUR NEGATIVE 07/30/2017 0530   KETONESUR NEGATIVE 07/30/2017 0530   PROTEINUR NEGATIVE 07/30/2017 0530   UROBILINOGEN 1.0 03/13/2015 0223   NITRITE NEGATIVE 07/30/2017 0530   LEUKOCYTESUR NEGATIVE 07/30/2017 0530   Sepsis Labs: @LABRCNTIP (procalcitonin:4,lacticidven:4)  )No results found for this or any previous visit (from the past 240 hour(s)).    Studies: No results found.  Scheduled Meds: . amLODipine  10 mg Oral Daily  . benztropine  1 mg Oral BID  . cloZAPine  25 mg Oral BID  . enoxaparin (LOVENOX) injection  40 mg Subcutaneous Q24H  . insulin aspart  0-9 Units Subcutaneous TID WC  . nicotine  7 mg Transdermal Daily  . propranolol  10 mg Oral BID    Continuous Infusions: . methylPREDNISolone (SOLU-MEDROL) injection Stopped (07/31/17 8115)  . valproate sodium Stopped (07/31/17 0728)     LOS: 1 day     Hollice Espy, MD [facility]  To reach me or the doctor on call, go to: [email or web address] Password [facility]  07/31/2017, 12:30 PM

## 2017-08-01 DIAGNOSIS — R739 Hyperglycemia, unspecified: Secondary | ICD-10-CM

## 2017-08-01 DIAGNOSIS — R401 Stupor: Secondary | ICD-10-CM

## 2017-08-01 LAB — GLUCOSE, CAPILLARY
Glucose-Capillary: 350 mg/dL — ABNORMAL HIGH (ref 65–99)
Glucose-Capillary: 197 mg/dL — ABNORMAL HIGH (ref 65–99)
Glucose-Capillary: 247 mg/dL — ABNORMAL HIGH (ref 65–99)
Glucose-Capillary: 223 mg/dL — ABNORMAL HIGH (ref 65–99)
Glucose-Capillary: 332 mg/dL — ABNORMAL HIGH (ref 65–99)

## 2017-08-01 MED ORDER — BISACODYL 10 MG RE SUPP
10.0000 mg | Freq: Once | RECTAL | Status: AC
  Administered 2017-08-01: 10 mg via RECTAL
  Filled 2017-08-01 (×2): qty 1

## 2017-08-01 MED ORDER — CLOZAPINE 25 MG PO TABS
25.0000 mg | ORAL_TABLET | Freq: Every day | ORAL | Status: DC
Start: 2017-08-02 — End: 2017-08-05
  Administered 2017-08-02 – 2017-08-05 (×4): 25 mg via ORAL
  Filled 2017-08-01 (×4): qty 1

## 2017-08-01 MED ORDER — CLOZAPINE 25 MG PO TABS
50.0000 mg | ORAL_TABLET | Freq: Every day | ORAL | Status: DC
  Administered 2017-08-01 – 2017-08-04 (×4): 50 mg via ORAL
  Filled 2017-08-01 (×4): qty 2

## 2017-08-01 MED ORDER — INSULIN ASPART 100 UNIT/ML ~~LOC~~ SOLN
5.0000 [IU] | Freq: Once | SUBCUTANEOUS | Status: AC
  Administered 2017-08-01: 5 [IU] via SUBCUTANEOUS

## 2017-08-01 NOTE — Plan of Care (Addendum)
  Progressing Elimination: Will not experience complications related to bowel motility 08/01/2017 0503 - Progressing by Burtis Junes, RN Complains of constipation after attempting to have bm. New orders for suppository will administer in am.  Patient wants to wait until later for suppository due to being sleepy

## 2017-08-01 NOTE — Progress Notes (Signed)
PT Cancellation Note  Patient Details Name: Brandon [name] MRN: [id number] DOB: January 14, 1991   Cancelled Treatment:    Reason Eval/Treat Not Completed: Other (comment).  Is waiting for a bath and his meal, then will consider doing therapy.  Per pt's mother, he is not expecting to leave until Monday.  Will try again tomorrow.   Ivar Drape 08/01/2017, 1:43 PM   [name], PT MS Acute Rehab Dept. Number: [facility] [id number] and [facility] [phone]

## 2017-08-01 NOTE — Progress Notes (Signed)
PROGRESS NOTE  Brandon [name] QMG:[id number] DOB: [birth year] DOA: 07/30/2017 PCP: Devra Dopp, MD  HPI/Recap of past 45 hours: 27 year old male with past medical history of schizophrenia, bipolar disorder and multiple sclerosis admitted for acute encephalopathy as well as weakness felt to be secondary to MS exacerbation area confusion felt to be secondary to extra trazodone patient had taken for sleep. Started on IV steroids and admitted to the hospitalist service.  Encephalopathy resolved soon after admission, felt to be secondary to excessive trazodone.  08/01/17: pt seen and examined with no family members at his bedside. He states he feels better although he still has some generalized weakness. He is alert and oriented x 3.   Assessment/Plan: Principal Problem:   Paranoid schizophrenia (HCC) Active Problems:   OBESITY   HTN (hypertension)   Multiple sclerosis exacerbation (HCC)   Acute encephalopathy   Hyperglycemia  Multiple sclerosis exacerbation (HCC):  -Continue IV steroids, day 3/5. -Neurology following.  -speech therapy and cleared swallow eval   HTN (hypertension) -BP is stable   Paranoid schizophrenia (HCC)/History of bipolar disorder:  -improving -Recently discharged from behavioral health -Psychiatry following   Acute encephalopathy: Resolved.  -suspect 2/2 to extra trazodone dose  Hyperglycemia s/s to steroid use: -A1C5.9 -ISS -hypoglycemia protocol  Obesity -weight loss in the outpatient setting when more stable   Code Status: Full  Family Communication: no family member at bedside  Disposition Plan: will stay another midnight to continue.    Consultants:  neurology  Procedures:  none  Antimicrobials:  none  DVT prophylaxis:  sq lovenox   Objective: Vitals:   07/31/17 1348 07/31/17 2141 08/01/17 0601 08/01/17 0603  BP: 131/89 (!) 153/64 136/64   Pulse: 90 94 64   Resp: 19 12 12    Temp: 98.9 F (37.2 C) 98.2 F (36.8 C)   (!) 97.5 F (36.4 C)  TempSrc: Oral Oral  Oral  SpO2: 98% 94% 97%   Weight:      Height:        Intake/Output Summary (Last 24 hours) at 08/01/2017 0908 Last data filed at 08/01/2017 8916 Gross per 24 hour  Intake 1489 ml  Output 1745 ml  Net -256 ml   Filed Weights   07/30/17 1351  Weight: (!) 142.1 kg (313 lb 4.4 oz)    Exam:   General:  27 yo obese AAM A&O x3 in NAD  Cardiovascular: RRR no rubs or gallops  Respiratory: mild rales at bases. No wheezes  Abdomen: obese NT nD NBS x4   Musculoskeletal: non focal   Skin: no open lesions  Psychiatry: Mood appropriate for condition and setting   Data Reviewed: CBC: Recent Labs  Lab 07/30/17 0142  WBC 8.3  NEUTROABS 4.2  HGB 13.2  HCT 42.0  MCV 80.3  PLT 214   Basic Metabolic Panel: Recent Labs  Lab 07/30/17 0142 07/31/17 0431  NA 139 137  K 4.1 4.5  CL 101 102  CO2 27 23  GLUCOSE 103* 188*  BUN 17 14  CREATININE 1.20 0.89  CALCIUM 9.3 9.0   GFR: Estimated Creatinine Clearance: 188.1 mL/min (by C-G formula based on SCr of 0.89 mg/dL). Liver Function Tests: Recent Labs  Lab 07/30/17 0142  AST 17  ALT 34  ALKPHOS 69  BILITOT 0.5  PROT 6.5  ALBUMIN 3.7   No results for input(s): LIPASE, AMYLASE in the last 168 hours. No results for input(s): AMMONIA in the last 168 hours. Coagulation Profile: No results for input(s): INR, PROTIME  in the last 168 hours. Cardiac Enzymes: Recent Labs  Lab 07/30/17 0142  CKTOTAL 155   BNP (last 3 results) No results for input(s): PROBNP in the last 8760 hours. HbA1C: Recent Labs    07/31/17 0900  HGBA1C 5.9*   CBG: Recent Labs  Lab 07/31/17 1221 07/31/17 1610 07/31/17 2137 08/01/17 0819  GLUCAP 290* 370* 350* 197*   Lipid Profile: No results for input(s): CHOL, HDL, LDLCALC, TRIG, CHOLHDL, LDLDIRECT in the last 72 hours. Thyroid Function Tests: No results for input(s): TSH, T4TOTAL, FREET4, T3FREE, THYROIDAB in the last 72 hours. Anemia  Panel: No results for input(s): VITAMINB12, FOLATE, FERRITIN, TIBC, IRON, RETICCTPCT in the last 72 hours. Urine analysis:    Component Value Date/Time   COLORURINE STRAW (A) 07/30/2017 0530   APPEARANCEUR CLEAR 07/30/2017 0530   LABSPEC 1.009 07/30/2017 0530   PHURINE 7.0 07/30/2017 0530   GLUCOSEU NEGATIVE 07/30/2017 0530   HGBUR NEGATIVE 07/30/2017 0530   HGBUR negative 01/14/2010 1451   BILIRUBINUR NEGATIVE 07/30/2017 0530   KETONESUR NEGATIVE 07/30/2017 0530   PROTEINUR NEGATIVE 07/30/2017 0530   UROBILINOGEN 1.0 03/13/2015 0223   NITRITE NEGATIVE 07/30/2017 0530   LEUKOCYTESUR NEGATIVE 07/30/2017 0530   Sepsis Labs: @LABRCNTIP (procalcitonin:4,lacticidven:4)  )No results found for this or any previous visit (from the past 240 hour(s)).    Studies: No results found.  Scheduled Meds: . amLODipine  10 mg Oral Daily  . benztropine  1 mg Oral BID  . bisacodyl  10 mg Rectal Once  . cloZAPine  25 mg Oral BID  . enoxaparin (LOVENOX) injection  40 mg Subcutaneous Q24H  . insulin aspart  0-9 Units Subcutaneous TID WC  . nicotine  7 mg Transdermal Daily  . propranolol  10 mg Oral BID    Continuous Infusions: . methylPREDNISolone (SOLU-MEDROL) injection 500 mg (08/01/17 0536)  . valproate sodium 500 mg (08/01/17 0536)     LOS: 2 days     Darlin Drop, MD [facility] Pager [phone]  If 7PM-7AM, please contact night-coverage [email or web address] Password [email or web address] 08/01/2017, 9:08 AM

## 2017-08-02 DIAGNOSIS — R29898 Other symptoms and signs involving the musculoskeletal system: Secondary | ICD-10-CM

## 2017-08-02 LAB — GLUCOSE, CAPILLARY
Glucose-Capillary: 221 mg/dL — ABNORMAL HIGH (ref 65–99)
Glucose-Capillary: 315 mg/dL — ABNORMAL HIGH (ref 65–99)
Glucose-Capillary: 251 mg/dL — ABNORMAL HIGH (ref 65–99)
Glucose-Capillary: 292 mg/dL — ABNORMAL HIGH (ref 65–99)

## 2017-08-02 NOTE — Progress Notes (Signed)
PROGRESS NOTE  Brandon [name] ZOX:[id number] DOB: Dec 14, 1990 DOA: 07/30/2017 PCP: Devra Dopp, MD  HPI/Recap of past 33 hours: 27 year old male with past medical history of schizophrenia, bipolar disorder and multiple sclerosis admitted for acute encephalopathy as well as weakness felt to be secondary to MS exacerbation area confusion felt to be secondary to extra trazodone patient had taken for sleep. Started on IV steroids and admitted to the hospitalist service.  Encephalopathy resolved soon after admission, felt to be secondary to excessive trazodone.  08/02/17: pt seen and examined with no family members at his bedside. Alert and oriented x 3. Still complaints of weakness in his left leg. Lives at home with his mother who is wheelchair bound and father who recently had surgery. Patient unsafe for discharge due to persistent weakness. Will continue PT. Case worker consulted for possible short term rehab.  Assessment/Plan: Principal Problem:   Paranoid schizophrenia (HCC) Active Problems:   OBESITY   HTN (hypertension)   Multiple sclerosis exacerbation (HCC)   Acute encephalopathy   Hyperglycemia  Multiple sclerosis exacerbation (HCC):  -Continue IV steroids, day 3/5. -Neurology following.  -speech therapy and cleared swallow eval -persistent left leg weakness -continue IV steroids day#4/5 -case worker for possible short term rehab   HTN (hypertension) -BP is stable   Paranoid schizophrenia (HCC)/History of bipolar disorder:  -improving -Recently discharged from behavioral health -continue psych meds as recommended by psychiatry   Acute encephalopathy: Resolved.  -suspect 2/2 to extra trazodone overuse  Hyperglycemia s/s to steroid use: -A1C 5.9 -ISS -hypoglycemia protocol  Obesity -weight loss in the outpatient setting when more stable   Code Status: Full  Family Communication: no family member at bedside  Disposition Plan: will stay another midnight to  continue.    Consultants:  neurology  Procedures:  none  Antimicrobials:  none  DVT prophylaxis:  sq lovenox   Objective: Vitals:   08/01/17 2204 08/01/17 2204 08/02/17 0606 08/02/17 0852  BP: 127/69 127/69 132/73 138/83  Pulse: 65 65 62 69  Resp: 18 18 17    Temp: 97.6 F (36.4 C) 97.6 F (36.4 C) (!) 97.4 F (36.3 C)   TempSrc: Oral     SpO2: 97% 97% 99%   Weight:      Height:        Intake/Output Summary (Last 24 hours) at 08/02/2017 1540 Last data filed at 08/02/2017 0943 Gross per 24 hour  Intake 650 ml  Output 850 ml  Net -200 ml   Filed Weights   07/30/17 1351  Weight: (!) 142.1 kg (313 lb 4.4 oz)    Exam: 08/02/17: seen and examined at his bedside.   General:  27 yo obese AAM A&O x3 in NAD  Cardiovascular: RRR no rubs or gallops  Respiratory: mild rales at bases. No wheezes  Abdomen: obese NT nD NBS x4   Musculoskeletal: left leg weakness 3/5 vs 4/5 on the right  Skin: no open lesions  Psychiatry: Mood appropriate for condition and setting   Data Reviewed: CBC: Recent Labs  Lab 07/30/17 0142  WBC 8.3  NEUTROABS 4.2  HGB 13.2  HCT 42.0  MCV 80.3  PLT 214   Basic Metabolic Panel: Recent Labs  Lab 07/30/17 0142 07/31/17 0431  NA 139 137  K 4.1 4.5  CL 101 102  CO2 27 23  GLUCOSE 103* 188*  BUN 17 14  CREATININE 1.20 0.89  CALCIUM 9.3 9.0   GFR: Estimated Creatinine Clearance: 188.1 mL/min (by C-G formula based on SCr  of 0.89 mg/dL). Liver Function Tests: Recent Labs  Lab 07/30/17 0142  AST 17  ALT 34  ALKPHOS 69  BILITOT 0.5  PROT 6.5  ALBUMIN 3.7   No results for input(s): LIPASE, AMYLASE in the last 168 hours. No results for input(s): AMMONIA in the last 168 hours. Coagulation Profile: No results for input(s): INR, PROTIME in the last 168 hours. Cardiac Enzymes: Recent Labs  Lab 07/30/17 0142  CKTOTAL 155   BNP (last 3 results) No results for input(s): PROBNP in the last 8760 hours. HbA1C: Recent  Labs    07/31/17 0900  HGBA1C 5.9*   CBG: Recent Labs  Lab 08/01/17 1150 08/01/17 1718 08/01/17 2206 08/02/17 0801 08/02/17 1214  GLUCAP 247* 223* 332* 221* 315*   Lipid Profile: No results for input(s): CHOL, HDL, LDLCALC, TRIG, CHOLHDL, LDLDIRECT in the last 72 hours. Thyroid Function Tests: No results for input(s): TSH, T4TOTAL, FREET4, T3FREE, THYROIDAB in the last 72 hours. Anemia Panel: No results for input(s): VITAMINB12, FOLATE, FERRITIN, TIBC, IRON, RETICCTPCT in the last 72 hours. Urine analysis:    Component Value Date/Time   COLORURINE STRAW (A) 07/30/2017 0530   APPEARANCEUR CLEAR 07/30/2017 0530   LABSPEC 1.009 07/30/2017 0530   PHURINE 7.0 07/30/2017 0530   GLUCOSEU NEGATIVE 07/30/2017 0530   HGBUR NEGATIVE 07/30/2017 0530   HGBUR negative 01/14/2010 1451   BILIRUBINUR NEGATIVE 07/30/2017 0530   KETONESUR NEGATIVE 07/30/2017 0530   PROTEINUR NEGATIVE 07/30/2017 0530   UROBILINOGEN 1.0 03/13/2015 0223   NITRITE NEGATIVE 07/30/2017 0530   LEUKOCYTESUR NEGATIVE 07/30/2017 0530   Sepsis Labs: @LABRCNTIP (procalcitonin:4,lacticidven:4)  )No results found for this or any previous visit (from the past 240 hour(s)).    Studies: No results found.  Scheduled Meds: . amLODipine  10 mg Oral Daily  . benztropine  1 mg Oral BID  . cloZAPine  25 mg Oral Daily  . cloZAPine  50 mg Oral QHS  . enoxaparin (LOVENOX) injection  40 mg Subcutaneous Q24H  . insulin aspart  0-9 Units Subcutaneous TID WC  . nicotine  7 mg Transdermal Daily  . propranolol  10 mg Oral BID    Continuous Infusions: . methylPREDNISolone (SOLU-MEDROL) injection Stopped (08/02/17 0559)  . valproate sodium Stopped (08/02/17 0839)     LOS: 3 days     Brandon Drop, MD [facility] Pager [phone]  If 7PM-7AM, please contact night-coverage [email or web address] Password [facility] 08/02/2017, 3:40 PM

## 2017-08-02 NOTE — Evaluation (Signed)
Physical Therapy Evaluation Patient Details Name: Brandon [name] MRN: [id number] DOB: 09-Oct-1990 Today's Date: 08/02/2017   History of Present Illness  27 year old male with past medical history of schizophrenia, bipolar disorder and multiple sclerosis admitted for acute encephalopathy as well as weakness felt to be secondary to MS exacerbation area confusion felt to be secondary to extra trazodone patient had taken for sleep. Started on IV steroids and admitted to the hospitalist service.  Encephalopathy resolved soon after admission, felt to be secondary to excessive trazodone  Clinical Impression   Pt admitted with above diagnosis. Pt currently with functional limitations due to the deficits listed below (see PT Problem List). Presents with variable effort during PT evaluation, at times unsuccessful standing attempt with +2 total assist, and then min assist to stand with the Corene Cornea (a novel piece of equipment to pt); I'm not sure I have a full and reliable picture of Brandon [name] home situation and available assist as he is an unreliable historian; would like more reliable info re PLOF and available home assist; Still, at this point, home with familiar environment, caregivers and routine may be the most therapeutic situation for Brandon [name]; Pt will benefit from skilled PT to increase their independence and safety with mobility to allow discharge to the venue listed below.    Discussed above with Dr. Margo Aye     Follow Up Recommendations Supervision/Assistance - 24 hour;Home health PT;(HHAide and HHSW follow-up); Outpatient PT Recommend Outpt PT after HH course    Equipment Recommendations  Rolling walker with 5" wheels;3in1 (PT);Other (comment)(Bariatric)    Recommendations for Other Services OT consult     Precautions / Restrictions Precautions Precautions: Fall      Mobility  Bed Mobility Overal bed mobility: Needs Assistance Bed Mobility: Supine to Sit     Supine to sit:  Min guard;HOB elevated(Used rails)     General bed mobility comments: Dependent on UEs and used bed rail to pull self around  Transfers Overall transfer level: Needs assistance Equipment used: Rolling walker (2 wheeled) Transfers: Sit to/from Stand Sit to Stand: +2 safety/equipment;+2 physical assistance;Total assist;Min assist         General transfer comment: 2 person Total assist to stand from bed, and with poor bil LE effort from pt, and inability to stand; shifted task to using Corene Cornea, and pt stood with 2 person Min assist, with pt pulling up on bar; then practiced standing from and sitting to chair, 5 reps with minguard assist; Noting better effort/participation with novel equipment  Ambulation/Gait Ambulation/Gait assistance: Min guard Ambulation Distance (Feet): (Marched in place for approx 3 minutes) Assistive device: (With bil UE support in Brandon [name])       General Gait Details: Heavy use of UE support on stedy; no buckling noted in stance R and L  Stairs            Wheelchair Mobility    Modified Rankin (Stroke Patients Only)       Balance Overall balance assessment: Needs assistance             Standing balance comment: Dependent on EU suport with upright activity otday                             Pertinent Vitals/Pain Pain Assessment: No/denies pain    Home Living Family/patient expects to be discharged to:: Private residence Living Arrangements: Parent;Other relatives Available Help at Discharge: Available PRN/intermittently;Other (Comment)(Pt did not indicate  24 hour assist is available to him)           Home Equipment: Other (comment)(borrowing rollator for family member) Additional Comments: Unable to obtain more information from patient    Prior Function Level of Independence: Needs assistance   Gait / Transfers Assistance Needed: no device on "good days"; Rollator or cane on "bad days"; he tells me bad days are rare,  and he usually doesn't need an assistive device  ADL's / Homemaking Assistance Needed: tells me a family member cooks, not him  Comments: Will need to verify information re: home situation and available assistance     Hand Dominance        Extremity/Trunk Assessment   Upper Extremity Assessment Upper Extremity Assessment: Overall WFL for tasks assessed(for simple mobility)    Lower Extremity Assessment Lower Extremity Assessment: Generalized weakness(symmetrical)       Communication   Communication: No difficulties  Cognition Arousal/Alertness: Awake/alert Behavior During Therapy: WFL for tasks assessed/performed Overall Cognitive Status: Within Functional Limits for tasks assessed(for simple mobility tasks)                                 General Comments: Noted one comment during session that seemed aimed at no one in the room; but pleasant with PT and rehab tech("OK, bro, I'll cut his head off")      General Comments      Exercises     Assessment/Plan    PT Assessment Patient needs continued PT services  PT Problem List Decreased strength;Decreased activity tolerance;Decreased balance;Decreased mobility;Decreased coordination;Decreased cognition;Decreased knowledge of use of DME;Decreased safety awareness;Decreased knowledge of precautions;Obesity       PT Treatment Interventions DME instruction;Gait training;Stair training;Functional mobility training;Therapeutic activities;Therapeutic exercise;Balance training;Neuromuscular re-education;Cognitive remediation;Patient/family education    PT Goals (Current goals can be found in the Care Plan section)  Acute Rehab PT Goals Patient Stated Goal: did not state PT Goal Formulation: With patient Time For Goal Achievement: 08/16/17 Potential to Achieve Goals: Good    Frequency Min 3X/week   Barriers to discharge Other (comment) I do not have a complete picture of how much assist is available to Mr.  Gertie [name], or how he typically functions at home    Co-evaluation               AM-PAC PT "6 Clicks" Daily Activity  Outcome Measure Difficulty turning over in bed (including adjusting bedclothes, sheets and blankets)?: A Little Difficulty moving from lying on back to sitting on the side of the bed? : A Little Difficulty sitting down on and standing up from a chair with arms (e.g., wheelchair, bedside commode, etc,.)?: A Lot Help needed moving to and from a bed to chair (including a wheelchair)?: A Lot Help needed walking in hospital room?: A Lot Help needed climbing 3-5 steps with a railing? : Total 6 Click Score: 13    End of Session Equipment Utilized During Treatment: Gait belt Activity Tolerance: Patient tolerated treatment well Patient left: in chair;with call bell/phone within reach;with chair alarm set Nurse Communication: Mobility status PT Visit Diagnosis: Unsteadiness on feet (R26.81);Other abnormalities of gait and mobility (R26.89);Muscle weakness (generalized) (M62.81);Repeated falls (R29.6)    Time: 1610-9604 PT Time Calculation (min) (ACUTE ONLY): 30 min   Charges:   PT Evaluation $PT Eval Moderate Complexity: 1 Mod PT Treatments $Therapeutic Activity: 8-22 mins   PT G Codes:        [employer]  Marylu Lund, PT  Acute Rehabilitation Services Pager [phone] Office [phone]   Levi Aland 08/02/2017, 12:36 PM

## 2017-08-02 NOTE — Progress Notes (Addendum)
Subjective:  Complains of being tired after working with physical therapy with improvement weakness bilateral lower extremities. Exam: Vitals:   08/02/17 0606 08/02/17 0852  BP: 132/73 138/83  Pulse: 62 69  Resp: 17   Temp: (!) 97.4 F (36.3 C)   SpO2: 99%     Physical Exam   HEENT-  Normocephalic, no lesions, without obvious abnormality.  Normal external eye and conjunctiva.  Mask like face Cardiovascular- S1-S2 audible, pulses palpable throughout   Lungs-no rhonchi or wheezing noted, no excessive working breathing.  Saturations within normal limits Abdomen- All 4 quadrants palpated and nontender Extremities- Warm, dry and intact Musculoskeletal-no joint tenderness, deformity or swelling   Neuro:  Mental Status: Alert, oriented, thought content appropriate.  Slowed speech dysarthric without evidence of aphasia.  Able to follow 3 step commands without difficulty. Cranial Nerves: II: Discs flat bilaterally; Visual fields grossly normal,  III,IV, VI: exotropia, extra-ocular motions intact bilaterally pupils equal, round, reactive to light and accommodation V,VII: smile symmetric, facial light touch sensation normal bilaterally VIII: hearing normal bilaterally IX,X: uvula rises symmetrically XI: bilateral shoulder shrug XII: midline tongue extension Motor: Effort dependent weakness in bilateral lower extremities Right : Upper extremity   5/5    Left:     Upper extremity   5/5  Lower extremity   5/5     Lower extremity   5/5 Tone and bulk:normal tone throughout; no atrophy noted Sensory: symmetric in BLE Deep Tendon Reflexes: Depressed throughout Plantars: Right: downgoing   Left: downgoing   Medications:  Scheduled: . amLODipine  10 mg Oral Daily  . benztropine  1 mg Oral BID  . cloZAPine  25 mg Oral Daily  . cloZAPine  50 mg Oral QHS  . enoxaparin (LOVENOX) injection  40 mg Subcutaneous Q24H  . insulin aspart  0-9 Units Subcutaneous TID WC  . nicotine  7 mg  Transdermal Daily  . propranolol  10 mg Oral BID   Continuous: . methylPREDNISolone (SOLU-MEDROL) injection Stopped (08/02/17 0559)  . valproate sodium Stopped (08/02/17 0839)   JYN:[id number] **OR** acetaminophen, hydrALAZINE, ondansetron **OR** ondansetron (ZOFRAN) IV, traZODone  Pertinent Labs/Diagnostics:   No results found. Felicie Morn PA-C [facility] [phone]  A: 27 year old male with active demyelination noted by imaging on MRI with symptoms of worsening speech and worsening gait.  On exam today patient had full strength in all extremities but still some dysarthria.  IMP: 1. Multiple Sclerosis Exacerbation: States left leg weakness improving, continues to have leg buckling due to weakness with ambulation 2. Paranoid Schizophrenia with Visual and Auditory Hallucinations - PT talking to someone in the room, and keeps saying " stop trying to mess  With my legs" to someone in the room.  Psych evaluated him and  Recommended Clozaril and Cogentin 3. Acute Encephalopathy 4. HTN Recommendations: -Continue Solu-Medrol 500mg  BID x5 days total.  Today is day 4/5 -PT/OT -Follow-up with Dr. Anne Hahn at [facility] neurology, who according to the patient already has plans to start him on disease modifying medication Ocrevus, which will be done as an outpatient.   Neurology service will sign off and will be available as needed.  Please call with questions.  Vivia Ewing DNP Neuro-hospitalist Team [phone]  08/02/2017, 12:14 PM   Attending Neurohospitalist Addendum Patient seen and examined with APP/Resident. Agree with the history and physical as documented above. Agree with the plan as documented, which I helped formulate. I have independently reviewed the chart, obtained history, review of systems and examined the patient.I have  personally reviewed pertinent head/neck/spine imaging (CT/MRI). Please feel free to call with any questions. --- Milon Dikes,  MD [facility] Pager: [phone]  If 7pm to 7am, please call on call as listed on AMION.

## 2017-08-03 DIAGNOSIS — R262 Difficulty in walking, not elsewhere classified: Secondary | ICD-10-CM

## 2017-08-03 LAB — COMPREHENSIVE METABOLIC PANEL
ALT: 37 U/L (ref 17–63)
AST: 14 U/L — ABNORMAL LOW (ref 15–41)
Albumin: 2.9 g/dL — ABNORMAL LOW (ref 3.5–5.0)
Alkaline Phosphatase: 66 U/L (ref 38–126)
Anion gap: 12 (ref 5–15)
BUN: 23 mg/dL — ABNORMAL HIGH (ref 6–20)
CO2: 23 mmol/L (ref 22–32)
Calcium: 8.6 mg/dL — ABNORMAL LOW (ref 8.9–10.3)
Chloride: 101 mmol/L (ref 101–111)
Creatinine, Ser: 0.99 mg/dL (ref 0.61–1.24)
GFR calc Af Amer: >60 mL/min (ref >60)
GFR calc non Af Amer: >60 mL/min (ref >60)
Glucose, Bld: 229 mg/dL — ABNORMAL HIGH (ref 65–99)
Potassium: 4.1 mmol/L (ref 3.5–5.1)
Sodium: 136 mmol/L (ref 135–145)
Total Bilirubin: 0.6 mg/dL (ref 0.3–1.2)
Total Protein: 5.7 g/dL — ABNORMAL LOW (ref 6.5–8.1)

## 2017-08-03 LAB — GLUCOSE, CAPILLARY
Glucose-Capillary: 181 mg/dL — ABNORMAL HIGH (ref 65–99)
Glucose-Capillary: 269 mg/dL — ABNORMAL HIGH (ref 65–99)
Glucose-Capillary: 240 mg/dL — ABNORMAL HIGH (ref 65–99)
Glucose-Capillary: 288 mg/dL — ABNORMAL HIGH (ref 65–99)

## 2017-08-03 LAB — CBC
HCT: 40.4 % (ref 39.0–52.0)
Hemoglobin: 13.2 g/dL (ref 13.0–17.0)
MCH: 25.8 pg — ABNORMAL LOW (ref 26.0–34.0)
MCHC: 32.7 g/dL (ref 30.0–36.0)
MCV: 79.1 fL (ref 78.0–100.0)
Platelets: 193 10*3/uL (ref 150–400)
RBC: 5.11 MIL/uL (ref 4.22–5.81)
RDW: 15.8 % — ABNORMAL HIGH (ref 11.5–15.5)
WBC: 16.5 10*3/uL — ABNORMAL HIGH (ref 4.0–10.5)

## 2017-08-03 MED ORDER — POLYETHYLENE GLYCOL 3350 17 G PO PACK
17.0000 g | PACK | Freq: Every day | ORAL | Status: DC
  Administered 2017-08-03 – 2017-08-05 (×3): 17 g via ORAL
  Filled 2017-08-03 (×2): qty 1

## 2017-08-03 MED ORDER — DIVALPROEX SODIUM 500 MG PO DR TAB: 500.0000 mg | DELAYED_RELEASE_TABLET | Freq: Two times a day (BID) | ORAL | Status: DC

## 2017-08-03 MED ORDER — FLEET ENEMA 7-19 GM/118ML RE ENEM
1.0000 | ENEMA | Freq: Every day | RECTAL | Status: DC | PRN
  Administered 2017-08-03: 1 via RECTAL
  Filled 2017-08-03: qty 1

## 2017-08-03 NOTE — Telephone Encounter (Signed)
Pt mother has called stating that pt has been in hospital since last Tues and should be released to go home tomorrow.  Pt has been on a 5 day high steroid(but may still relapse) and Dr Margo Aye has now stated nothing more they can do but set him up for OT and PT.  Pt mother states she made Dr Margo Aye aware still waiting on approval for  Ocrevus .  Pt mother is asking to be called re: getting pt in soon with Dr Anne Hahn, pt is on wait list.

## 2017-08-03 NOTE — Progress Notes (Signed)
Results for Brandon [name], Brandon [name] (MRN [id number]) as of 08/03/2017 12:53  Ref. Range 08/02/2017 12:14 08/02/2017 17:22 08/02/2017 21:56 08/03/2017 07:46 08/03/2017 11:57  Glucose-Capillary Latest Ref Range: 65 - 99 mg/dL 409 (H) 811 (H) 914 (H) 181 (H) 269 (H)  Noted that blood sugars have been greater than 180 mg/dl.. Recommend increasing Novolog correction scale to MODERATE TID if blood sugars continue to be elevated and while on steroids.    Smith Mince RN BSN CDE Diabetes Coordinator Pager: [phone]  8am-5pm

## 2017-08-03 NOTE — Telephone Encounter (Signed)
I called the mother.  The patient was just in the hospital with an MS exacerbation, new lesions on MRI.  He has been off of the Tysabri to long, we need to switch him to the Ocrevus as soon as possible, apparently this medication has not been fully approved yet.  I will try to find out what is going on, the estimated time that will take to start this medication.  The patient desperately needs to get on this medication as soon as possible.

## 2017-08-03 NOTE — Progress Notes (Signed)
Physical Therapy Treatment Patient Details Name: Brandon [name] MRN: [id number] DOB: [birth year] Today's Date: 08/03/2017    History of Present Illness 27 year old male with past medical history of schizophrenia, bipolar disorder and multiple sclerosis admitted for acute encephalopathy as well as weakness felt to be secondary to MS exacerbation area confusion felt to be secondary to extra trazodone patient had taken for sleep. Started on IV steroids and admitted to the hospitalist service.  Encephalopathy resolved soon after admission, felt to be secondary to excessive trazodone    PT Comments    Patient received in bed, reporting he is sleepy but willing to participate with skilled PT services today; he continues to be able to perform bed mobility with min guard and was able to perform partial sit to stand with MaxAx2 today however knees remained slightly bend and patient had to sit back down on bed quickly. Attempted to use stedy lift but patient did not demonstrate good trunk control following sit to stand transfer and states "I am still sleepy...Marland KitchenMarland KitchenI've told you guys what's wrong with me", requiring Min-ModA at edge of bed today, so stedy transfer aborted. At this point patient then started waving arms and legs and laid down backwards on bed, requiring totalA x2 to get him to safe position and repositioned in the bed. He was left in bed with alarm activated, all needs otherwise met.    Follow Up Recommendations  Supervision/Assistance - 24 hour;Home health PT;Outpatient PT     Equipment Recommendations  Rolling walker with 5" wheels;3in1 (PT);Other (comment)    Recommendations for Other Services OT consult     Precautions / Restrictions Precautions Precautions: Fall Restrictions Weight Bearing Restrictions: No    Mobility  Bed Mobility Overal bed mobility: Needs Assistance Bed Mobility: Supine to Sit     Supine to sit: Min guard     General bed mobility comments: reliant on  UEs/used bed rail to pull self around   Transfers Overall transfer level: Needs assistance Equipment used: Rolling walker (2 wheeled) Transfers: Sit to/from Stand Sit to Stand: +2 physical assistance;+2 safety/equipment;Max assist         General transfer comment: 2 person MaxA to partially stand with walker, patient very weak and sat back down on bed; attempted using steady but patient with poor trunk stability, patient reports he's very sleepy and seems "out of it", did not attempt moblility with Denna Haggard. Patient continued with trunk instability then began shaking arms and laid flat back on bed, required TotalA x2 for repositioning in bed   Ambulation/Gait             General Gait Details: did not attempt due to safety concerns    Stairs            Wheelchair Mobility    Modified Rankin (Stroke Patients Only)       Balance Overall balance assessment: Needs assistance Sitting-balance support: Bilateral upper extremity supported;Feet supported Sitting balance-Leahy Scale: Poor Sitting balance - Comments: poor trunk stability noted today, dependent on UE support      Standing balance-Leahy Scale: Poor Standing balance comment: dependent on UE support on walker                             Cognition Arousal/Alertness: Awake/alert Behavior During Therapy: WFL for tasks assessed/performed Overall Cognitive Status: Within Functional Limits for tasks assessed  Exercises      General Comments        Pertinent Vitals/Pain Pain Assessment: No/denies pain    Home Living                      Prior Function            PT Goals (current goals can now be found in the care plan section) Acute Rehab PT Goals Patient Stated Goal: did not state PT Goal Formulation: With patient Time For Goal Achievement: 08/16/17 Potential to Achieve Goals: Good Progress towards PT goals: Progressing  toward goals    Frequency    Min 3X/week      PT Plan Current plan remains appropriate    Co-evaluation              AM-PAC PT "6 Clicks" Daily Activity  Outcome Measure  Difficulty turning over in bed (including adjusting bedclothes, sheets and blankets)?: Unable Difficulty moving from lying on back to sitting on the side of the bed? : Unable Difficulty sitting down on and standing up from a chair with arms (e.g., wheelchair, bedside commode, etc,.)?: Unable Help needed moving to and from a bed to chair (including a wheelchair)?: A Lot Help needed walking in hospital room?: A Lot Help needed climbing 3-5 steps with a railing? : Total 6 Click Score: 8    End of Session Equipment Utilized During Treatment: Gait belt Activity Tolerance: Patient limited by fatigue Patient left: in bed;with call bell/phone within reach;with bed alarm set   PT Visit Diagnosis: Unsteadiness on feet (R26.81);Other abnormalities of gait and mobility (R26.89);Muscle weakness (generalized) (M62.81);Repeated falls (R29.6)     Time: 1350-1405 PT Time Calculation (min) (ACUTE ONLY): 15 min  Charges:  $Therapeutic Activity: 8-22 mins                    G Codes:       Deniece Ree PT, DPT, CBIS  Supplemental Physical Therapist [place]   Pager [phone]

## 2017-08-03 NOTE — Telephone Encounter (Signed)
Pt mother has called back and apologized for missing the call of Dr Anne Hahn, she is asking for a call when Dr Anne Hahn becomes available and has assured she will have the phone with her

## 2017-08-03 NOTE — Telephone Encounter (Signed)
I called the mother, left a message, I will call back.  The patient was just in the hospital with an MS flare, unable to walk, altered mental status.  MRI does show active MS lesions.  The patient is still not on Ocrevus.

## 2017-08-03 NOTE — Progress Notes (Signed)
PROGRESS NOTE  BOW ADER XAJ:[id number] DOB: 1991/06/29 DOA: 07/30/2017 PCP: Devra Dopp, MD  HPI/Recap of past 44 hours: 27 year old male with past medical history of schizophrenia, bipolar disorder and multiple sclerosis admitted for acute encephalopathy as well as weakness felt to be secondary to MS exacerbation area confusion felt to be secondary to extra trazodone patient had taken for sleep. Started on IV steroids and admitted to the hospitalist service.  Encephalopathy resolved soon after admission, felt to be secondary to excessive trazodone.  08/02/17: pt seen and examined with no family members at his bedside. Alert and oriented x 3. Still complaints of weakness in his left leg. Lives at home with his mother who is wheelchair bound and father who recently had surgery. Patient unsafe for discharge due to persistent weakness. Will continue PT. Case worker consulted for possible short term rehab.  08/03/17: seen and examined with his mother at his bedside. Still some generalized weakness. Concern that he might fall at home and not get any help getting back up. Mother asking for equipments to accommodate at home. Case manager consulted to assist.  Assessment/Plan: Principal Problem:   Paranoid schizophrenia (HCC) Active Problems:   OBESITY   HTN (hypertension)   Multiple sclerosis exacerbation (HCC)   Acute encephalopathy   Hyperglycemia  Multiple sclerosis exacerbation (HCC):  -Continue IV steroids, day 3/5. -Neurology following.  -speech therapy and cleared swallow eval -persistent left leg weakness -continue IV steroids day#5/5 -case worker for possible short term rehab -case manager to assist with requested equipments by patient's mother   HTN (hypertension) -BP is stable   Paranoid schizophrenia (HCC)/History of bipolar disorder:  -improving -Recently discharged from behavioral health -continue psych meds as recommended by psychiatry   Acute encephalopathy:  Resolved.  -suspect 2/2 to extra trazodone overuse  Hyperglycemia s/s to steroid use: -A1C 5.9 -ISS -hypoglycemia protocol  Obesity -weight loss in the outpatient setting when more stable  Ambulatory dysfunction -PT following -will need to continue PT in outpatient setting -Fall precaution   Code Status: Full  Family Communication: mother, all questions answered to her satisfaction  Disposition Plan: will stay another midnight to continue current management.    Consultants:  neurology  Procedures:  none  Antimicrobials:  none  DVT prophylaxis:  sq lovenox   Objective: Vitals:   08/02/17 2201 08/02/17 2201 08/03/17 0512 08/03/17 1348  BP: (!) 149/69 (!) 146/69 (!) 181/127 (!) 164/85  Pulse: 60 60 77 61  Resp: 18 18 18    Temp: 97.9 F (36.6 C) 97.9 F (36.6 C) 98 F (36.7 C) 98.1 F (36.7 C)  TempSrc: Oral  Oral Oral  SpO2: 91% 91% 96% 100%  Weight:      Height:        Intake/Output Summary (Last 24 hours) at 08/03/2017 1629 Last data filed at 08/03/2017 1319 Gross per 24 hour  Intake 697 ml  Output 900 ml  Net -203 ml   Filed Weights   07/30/17 1351  Weight: (!) 142.1 kg (313 lb 4.4 oz)    Exam: 08/03/17: seen and examined at his bedside.   General:  27 yo obese AAM A&O x3 in NAD  Cardiovascular: RRR no rubs or gallops  Respiratory: mild rales at bases. No wheezes  Abdomen: obese NT nD NBS x4   Musculoskeletal: left leg weakness 3/5 vs 4/5 on the right  Skin: no open lesions  Psychiatry: Mood appropriate for condition and setting   Data Reviewed: CBC: Recent Labs  Lab 07/30/17  0142 08/03/17 0144  WBC 8.3 16.5*  NEUTROABS 4.2  --   HGB 13.2 13.2  HCT 42.0 40.4  MCV 80.3 79.1  PLT 214 193   Basic Metabolic Panel: Recent Labs  Lab 07/30/17 0142 07/31/17 0431 08/03/17 0144  NA 139 137 136  K 4.1 4.5 4.1  CL 101 102 101  CO2 27 23 23   GLUCOSE 103* 188* 229*  BUN 17 14 23*  CREATININE 1.20 0.89 0.99  CALCIUM 9.3  9.0 8.6*   GFR: Estimated Creatinine Clearance: 169.1 mL/min (by C-G formula based on SCr of 0.99 mg/dL). Liver Function Tests: Recent Labs  Lab 07/30/17 0142 08/03/17 0144  AST 17 14*  ALT 34 37  ALKPHOS 69 66  BILITOT 0.5 0.6  PROT 6.5 5.7*  ALBUMIN 3.7 2.9*   No results for input(s): LIPASE, AMYLASE in the last 168 hours. No results for input(s): AMMONIA in the last 168 hours. Coagulation Profile: No results for input(s): INR, PROTIME in the last 168 hours. Cardiac Enzymes: Recent Labs  Lab 07/30/17 0142  CKTOTAL 155   BNP (last 3 results) No results for input(s): PROBNP in the last 8760 hours. HbA1C: No results for input(s): HGBA1C in the last 72 hours. CBG: Recent Labs  Lab 08/02/17 1214 08/02/17 1722 08/02/17 2156 08/03/17 0746 08/03/17 1157  GLUCAP 315* 251* 292* 181* 269*   Lipid Profile: No results for input(s): CHOL, HDL, LDLCALC, TRIG, CHOLHDL, LDLDIRECT in the last 72 hours. Thyroid Function Tests: No results for input(s): TSH, T4TOTAL, FREET4, T3FREE, THYROIDAB in the last 72 hours. Anemia Panel: No results for input(s): VITAMINB12, FOLATE, FERRITIN, TIBC, IRON, RETICCTPCT in the last 72 hours. Urine analysis:    Component Value Date/Time   COLORURINE STRAW (A) 07/30/2017 0530   APPEARANCEUR CLEAR 07/30/2017 0530   LABSPEC 1.009 07/30/2017 0530   PHURINE 7.0 07/30/2017 0530   GLUCOSEU NEGATIVE 07/30/2017 0530   HGBUR NEGATIVE 07/30/2017 0530   HGBUR negative 01/14/2010 1451   BILIRUBINUR NEGATIVE 07/30/2017 0530   KETONESUR NEGATIVE 07/30/2017 0530   PROTEINUR NEGATIVE 07/30/2017 0530   UROBILINOGEN 1.0 03/13/2015 0223   NITRITE NEGATIVE 07/30/2017 0530   LEUKOCYTESUR NEGATIVE 07/30/2017 0530   Sepsis Labs: @LABRCNTIP (procalcitonin:4,lacticidven:4)  )No results found for this or any previous visit (from the past 240 hour(s)).    Studies: No results found.  Scheduled Meds: . amLODipine  10 mg Oral Daily  . benztropine  1 mg Oral  BID  . cloZAPine  25 mg Oral Daily  . cloZAPine  50 mg Oral QHS  . enoxaparin (LOVENOX) injection  40 mg Subcutaneous Q24H  . insulin aspart  0-9 Units Subcutaneous TID WC  . nicotine  7 mg Transdermal Daily  . polyethylene glycol  17 g Oral Daily  . propranolol  10 mg Oral BID    Continuous Infusions: . methylPREDNISolone (SOLU-MEDROL) injection 500 mg (08/03/17 0606)     LOS: 4 days     Darlin Drop, MD [facility] Pager [phone]  If 7PM-7AM, please contact night-coverage [email or web address] Password [facility] 08/03/2017, 4:29 PM

## 2017-08-03 NOTE — Progress Notes (Signed)
Unsure if pt has had bm. Pt not reliable.  Nothing charted for bm. pts mom requesting laxative be given.  Send note to md.

## 2017-08-04 DIAGNOSIS — E119 Type 2 diabetes mellitus without complications: Secondary | ICD-10-CM

## 2017-08-04 DIAGNOSIS — E669 Obesity, unspecified: Secondary | ICD-10-CM

## 2017-08-04 LAB — GLUCOSE, CAPILLARY
Glucose-Capillary: 198 mg/dL — ABNORMAL HIGH (ref 65–99)
Glucose-Capillary: 270 mg/dL — ABNORMAL HIGH (ref 65–99)
Glucose-Capillary: 309 mg/dL — ABNORMAL HIGH (ref 65–99)
Glucose-Capillary: 200 mg/dL — ABNORMAL HIGH (ref 65–99)

## 2017-08-04 NOTE — Progress Notes (Signed)
    Durable Medical Equipment  (From admission, onward)        Start     Ordered   08/04/17 1402  For home use only DME Wheelchair electric  Once     08/04/17 1401   08/04/17 1400  For home use only DME Hospital bed  Once    Question Answer Comment  The above medical condition requires: Patient requires the ability to reposition frequently   Head must be elevated greater than: 30 degrees   Bed type Semi-electric   Trapeze Bar Yes      08/04/17 1400   08/03/17 1852  For home use only DME Specialty bed  Once    Comments:  Pt also requests bsc, rolling walker, trapeze bar..etc   08/03/17 1610

## 2017-08-04 NOTE — Care Management Note (Signed)
Case Management Note  Patient Details  Name: Brandon [name] MRN: [id number] Date of Birth: Dec 01, 1990  Subjective/Objective:    Presents with AMS, hx of  MS, schizophrenia. Resides with with mom and uncle.             Johny Blamer (Mother) Regory Cluney [facility] [phone]       PCP: Devra Dopp  Action/Plan:  Transition to home with home health services to follow. Mom states pt will have 24/7 supervision once d/c with she and pt's uncle. Pt will need non emergency ambulance service called for transportation to home once equipment has been delivered to home by [facility].    Expected Discharge Date:  08/04/17               Expected Discharge Plan:  Home w Home Health Services  In-House Referral:     Discharge planning Services  CM Consult  Post Acute Care Choice:  Home Health, Durable Medical Equipment Choice offered to:  Parent  DME Arranged:  3-N-1, Trapeze, Wheelchair manual, Specialty bed DME Agency:   [facility]  HH Arranged:  PT, OT, Social Work, Nurse's Aide HH Agency:  [facility]  Status of Service:  Completed, signed off  If discussed at [employer], dates discussed:    Additional Comments:  Epifanio Lesches, RN 08/04/2017, 5:18 PM

## 2017-08-04 NOTE — Discharge Summary (Signed)
Discharge Summary  Brandon [name] ZOX:[id number] DOB: Jul 26, 1990  PCP: Devra Dopp, MD  Admit date: 07/30/2017 Discharge date: 08/04/2017  Time spent: 25 minutes  Recommendations for Outpatient Follow-up:  1. Follow up with your neurologist post hospitalization 2. Follow up with your PCP 3. Take your medications as prescribed 4. Fall precaution 5. Continue Home Health PT  Discharge Diagnoses:  Active Hospital Problems   Diagnosis Date Noted  . Paranoid schizophrenia (HCC)   . Hyperglycemia 07/31/2017  . Multiple sclerosis exacerbation (HCC) 07/30/2017  . Acute encephalopathy 07/30/2017  . HTN (hypertension) 07/04/2011  . OBESITY 01/14/2010    Resolved Hospital Problems  No resolved problems to display.    Discharge Condition: stable  Diet recommendation: Resume previous diet   Vitals:   08/04/17 1013 08/04/17 1500  BP: (!) 143/87 140/85  Pulse: 61 60  Resp:  20  Temp:  98.5 F (36.9 C)  SpO2:  98%    History of present illness:  27 year old male with past medical history of schizophrenia, bipolar disorder and multiple sclerosis admitted for acute encephalopathy as well as weakness felt to be secondary to MS exacerbation area confusion felt to be secondary to extra trazodone patient had taken for sleep.Started on IV steroids and admitted to the hospitalist service. Encephalopathy resolved soon after admission, felt to be secondary to excessive trazodone.  08/02/17: pt seen and examined with no family members at his bedside. Alert and oriented x 3. Still complaints of weakness in his left leg. Lives at home with his mother who is wheelchair bound and father who recently had surgery. Patient unsafe for discharge due to persistent weakness. Will continue PT. Case worker consulted for possible short term rehab.  08/03/17: seen and examined with his mother at his bedside. Still some generalized weakness. Concern that he might fall at home and not get any help  getting back up. Mother asking for equipments to accommodate at home. Case manager consulted to assist.  08/04/17: No complaints.  On the day of discharge the patient was hemodynamically stable. He has ambulatory dysfunction related to his MS. Patient can't use crutch, cane, or walker to resolve his ambulatory dysfunction when ambulating long distances and will require a wheelchair for his own safety.    Hospital Course:  Principal Problem:   Paranoid schizophrenia (HCC) Active Problems:   OBESITY   HTN (hypertension)   Multiple sclerosis exacerbation (HCC)   Acute encephalopathy   Hyperglycemia   Multiple sclerosis exacerbation (HCC):  -Continue IV steroids, day 3/5. -Neurology following.  -speech therapy and cleared swallow eval -persistent left leg weakness -continue IV steroids day#5/5; completed course.  HTN (hypertension) -BP is stable  Paranoid schizophrenia (HCC)/History of bipolar disorder:  -improving -Recently discharged from behavioral health -continue psych meds as recommended by psychiatry  Acute encephalopathy: Resolved.  -suspect 2/2 to extra trazodone overuse  Hyperglycemia s/s to steroid use: -A1C 5.9 -ISS -hypoglycemia protocol  Obesity -weight loss in the outpatient setting when more stable  Ambulatory dysfunction -PT following -will need to continue PT in outpatient setting -Fall precaution -Patient can't use crutch, cane, or walker to resolve his ambulatory dysfunction when ambulating long distances and will require a wheelchair for his own safety.     Procedures:  None  Consultations:  Neurology   Discharge Exam: BP 140/85 (BP Location: Right Wrist)   Pulse 60   Temp 98.5 F (36.9 C) (Oral)   Resp 20   Ht 6\' 1"  (1.854 m)   Wt (!) 142.1 kg (  313 lb 4.4 oz)   SpO2 98%   BMI 41.33 kg/m   General: 27 yo obese AAM NAD A&O x3  Cardiovascular: RRR no rubs or gallops  Respiratory: CTA no wheezes or rales   Discharge  Instructions You were cared for by a hospitalist during your hospital stay. If you have any questions about your discharge medications or the care you received while you were in the hospital after you are discharged, you can call the unit and asked to speak with the hospitalist on call if the hospitalist that took care of you is not available. Once you are discharged, your primary care physician will handle any further medical issues. Please note that NO REFILLS for any discharge medications will be authorized once you are discharged, as it is imperative that you return to your primary care physician (or establish a relationship with a primary care physician if you do not have one) for your aftercare needs so that they can reassess your need for medications and monitor your lab values.   Allergies as of 08/04/2017   No Known Allergies     Medication List    TAKE these medications   amLODipine 10 MG tablet Commonly known as:  NORVASC Take 10 mg by mouth daily.   benztropine 1 MG tablet Commonly known as:  COGENTIN Take 1 tablet (1 mg total) by mouth 2 (two) times daily. What changed:  when to take this   cloZAPine 25 MG tablet Commonly known as:  CLOZARIL Take 25 mg by mouth 2 (two) times daily.   INVEGA SUSTENNA 234 MG/1.5ML Susp injection Generic drug:  paliperidone Inject 234 mg into the muscle every 30 (thirty) days.   Melatonin 5 MG Tabs Take 10 mg by mouth at bedtime.   predniSONE 10 MG tablet Commonly known as:  DELTASONE Begin taking 6 tablets daily, taper by one tablet every other day until off the medication.   propranolol 10 MG tablet Commonly known as:  INDERAL Take 1 tablet (10 mg total) by mouth 2 (two) times daily.   traZODone 100 MG tablet Commonly known as:  DESYREL Take 300 mg by mouth at bedtime as needed for sleep.            Durable Medical Equipment  (From admission, onward)        Start     Ordered   08/04/17 1409  For home use only DME  standard manual wheelchair with seat cushion  Once    Comments:  Patient suffers from MS which impairs his ability to perform daily activities like in the home.  A wheelchair will not resolve  issue with performing activities of daily living. A wheelchair will allow patient to safely perform daily activities. Patient can safely propel the wheelchair in the home or has a caregiver who can provide assistance.  Accessories: elevating leg rests (ELRs), wheel locks, extensions and anti-tippers.   08/04/17 1409   08/04/17 1400  For home use only DME Hospital bed  Once    Question Answer Comment  The above medical condition requires: Patient requires the ability to reposition frequently   Head must be elevated greater than: 30 degrees   Bed type Semi-electric   Trapeze Bar Yes      08/04/17 1400   08/03/17 1852  For home use only DME Specialty bed  Once    Comments:  Pt also requests bsc, rolling walker, trapeze bar..etc   08/03/17 1852     No Known Allergies Follow-up  Information    Devra Dopp, MD Follow up.   Specialty:  Family Medicine Contact information: [address] [place] [address] [phone]        York Spaniel, MD Follow up.   Specialty:  Neurology Contact information: [address] [place] [address] [phone]            The results of significant diagnostics from this hospitalization (including imaging, microbiology, ancillary and laboratory) are listed below for reference.    Significant Diagnostic Studies: Mr Laqueta Jean And Wo Contrast  Result Date: 07/30/2017 CLINICAL DATA:  Initial evaluation for possible multiple sclerosis flare. EXAM: MRI HEAD WITHOUT AND WITH CONTRAST TECHNIQUE: Multiplanar, multiecho pulse sequences of the brain and surrounding structures were obtained without and with intravenous contrast. CONTRAST:  20mL MULTIHANCE GADOBENATE DIMEGLUMINE 529 MG/ML IV SOLN COMPARISON:  Prior MRI from  05/11/2017. FINDINGS: Brain: Advanced cerebral atrophy for age, similar relative to previous exam. Extensive patchy and confluent T2/FLAIR signal seen throughout the periventricular, deep, and subcortical white matter both cerebral hemispheres, consistent with known history of multiple sclerosis. Overall, degree of white matter disease is stable the slightly progressed relative to previous. There are a few scattered patchy areas of restricted diffusion involving the periventricular white matter, most notable at the right aspect of the splenium, extending into the adjacent right occipital white matter (series 4, image 27). Additional mild restricted diffusion about a lesion at the superior left thalamus/periventricular white matter (series 4, image 32). Faint enhancement seen about these lesions as well (series 15, image 28). Findings consistent with active demyelination. No other evidence for active demyelination. No evidence for acute infarct. Gray-white matter differentiation otherwise maintained. No mass lesion, mass effect, or midline shift. No hydrocephalus. No extra-axial fluid collection. No other abnormal enhancement. Pituitary gland suprasellar region normal. Midline structures intact. Vascular: Major intracranial vascular flow voids are maintained. Skull and upper cervical spine: Craniocervical junction within normal limits. Upper cervical spine normal. Bone marrow signal intensity within normal limits. No scalp soft tissue abnormality. Sinuses/Orbits: Globes and orbital soft tissues within normal limits. Moderate to advanced mucosal thickening throughout the ethmoidal air cells and maxillary sinuses, likely allergic/inflammatory in nature. No air-fluid level to suggest acute sinusitis. No mastoid effusion. Inner ear structures normal. Other: None. IMPRESSION: 1. Extensive cerebral white matter changes as above, consistent with history of demyelinating disease/multiple sclerosis. Restricted diffusion about  a few lesions at the right splenium/occipital lobe and left thalamus/periventricular white matter, consistent with active demyelination. 2. Moderate allergic/inflammatory paranasal sinus disease. Electronically Signed   By: Rise Mu M.D.   On: 07/30/2017 03:40    Microbiology: No results found for this or any previous visit (from the past 240 hour(s)).   Labs: Basic Metabolic Panel: Recent Labs  Lab 07/30/17 0142 07/31/17 0431 08/03/17 0144  NA 139 137 136  K 4.1 4.5 4.1  CL 101 102 101  CO2 27 23 23   GLUCOSE 103* 188* 229*  BUN 17 14 23*  CREATININE 1.20 0.89 0.99  CALCIUM 9.3 9.0 8.6*   Liver Function Tests: Recent Labs  Lab 07/30/17 0142 08/03/17 0144  AST 17 14*  ALT 34 37  ALKPHOS 69 66  BILITOT 0.5 0.6  PROT 6.5 5.7*  ALBUMIN 3.7 2.9*   No results for input(s): LIPASE, AMYLASE in the last 168 hours. No results for input(s): AMMONIA in the last 168 hours. CBC: Recent Labs  Lab 07/30/17 0142 08/03/17 0144  WBC 8.3 16.5*  NEUTROABS 4.2  --   HGB 13.2 13.2  HCT 42.0 40.4  MCV 80.3 79.1  PLT 214 193   Cardiac Enzymes: Recent Labs  Lab 07/30/17 0142  CKTOTAL 155   BNP: BNP (last 3 results) No results for input(s): BNP in the last 8760 hours.  ProBNP (last 3 results) No results for input(s): PROBNP in the last 8760 hours.  CBG: Recent Labs  Lab 08/03/17 1157 08/03/17 1658 08/03/17 2151 08/04/17 0803 08/04/17 1227  GLUCAP 269* 240* 288* 198* 270*       Signed:  Darlin Drop, MD [facility] 08/04/2017, 3:55 PM

## 2017-08-04 NOTE — Telephone Encounter (Signed)
Information provided to [facility] with a request to follow up with Dr. Anne Hahn on patient's Ocrevus start status.

## 2017-08-04 NOTE — Progress Notes (Signed)
    Durable Medical Equipment  (From admission, onward)        Start     Ordered   08/04/17 1409  For home use only DME standard manual wheelchair with seat cushion  Once    Comments:  Patient suffers from MS which impairs his ability to perform daily activities like in the home.  A wheelchair will not resolve  issue with performing activities of daily living. A wheelchair will allow patient to safely perform daily activities. Patient can safely propel the wheelchair in the home or has a caregiver who can provide assistance.  Accessories: elevating leg rests (ELRs), wheel locks, extensions and anti-tippers.   08/04/17 1409   08/04/17 1400  For home use only DME Hospital bed  Once    Question Answer Comment  The above medical condition requires: Patient requires the ability to reposition frequently   Head must be elevated greater than: 30 degrees   Bed type Semi-electric   Trapeze Bar Yes      08/04/17 1400   08/03/17 1852  For home use only DME Specialty bed  Once    Comments:  Pt also requests bsc, rolling walker, trapeze bar..etc   08/03/17 3016

## 2017-08-04 NOTE — Progress Notes (Deleted)
[employer] Patient Connection Application  Form for Lovenox completed and faxed to [phone]. CM spoke with customer service @ [phone] and learned  processing time is within 2-3 business days and once application is approved shipping is with 3-5 business days. If pt is denied they will make contact with pt and also notify [facility]/ Dr. Charles Pickens through fax, [phone]. Kimberlin Scheel RN,BSN,CM            

## 2017-08-05 DIAGNOSIS — G35 Multiple sclerosis: Principal | ICD-10-CM

## 2017-08-05 DIAGNOSIS — F200 Paranoid schizophrenia: Secondary | ICD-10-CM

## 2017-08-05 LAB — GLUCOSE, CAPILLARY
Glucose-Capillary: 187 mg/dL — ABNORMAL HIGH (ref 65–99)
Glucose-Capillary: 289 mg/dL — ABNORMAL HIGH (ref 65–99)

## 2017-08-05 NOTE — Progress Notes (Signed)
Patient ID: Brandon [name], male   DOB: 03-23-1991, 27 y.o.   MRN: [id number] Patient was supposed to be discharged on 08/04/2017 but hospital bed was not able to be delivered.  Patient will be discharged today once the arrangements are made.  Patient seen and examined at bedside.  Please refer to the full discharge summary done by Dr. Margo Aye on 08/04/2017 for full details.  Outpatient follow-up with neurology and psychiatry.

## 2017-08-05 NOTE — Progress Notes (Signed)
Physical Therapy Treatment Patient Details Name: Brandon [name] MRN: [id number] DOB: 08/09/1990 Today's Date: 08/05/2017    History of Present Illness 27 year old male with past medical history of schizophrenia, bipolar disorder and multiple sclerosis admitted for acute encephalopathy as well as weakness felt to be secondary to MS exacerbation area confusion felt to be secondary to extra trazodone patient had taken for sleep. Started on IV steroids and admitted to the hospitalist service.  Encephalopathy resolved soon after admission, felt to be secondary to excessive trazodone    PT Comments    Patient received in bed, pleasant and willing to work with skilled rehabilitation services this morning. He is able to complete bed mobility with min guard but does require ModA with +2 assist for sit to stand for safety; he was able to take 3-4 steps over to chair with RW but was unsteady and unsafe, attempting premature sit and requiring PT intervention to maintain safety. Patient much more pleasant and able to participate in demands of skilled PT session in the morning as compared to the afternoon, reports he does better in the mornings at home as well. He was left up in the chair with all needs met, chair alarm activated and CNA educated on safety with transfer back to bed.     Follow Up Recommendations  Supervision/Assistance - 24 hour;Home health PT;Outpatient PT     Equipment Recommendations  Rolling walker with 5" wheels;3in1 (PT);Other (comment)    Recommendations for Other Services OT consult     Precautions / Restrictions Precautions Precautions: Fall Restrictions Weight Bearing Restrictions: No    Mobility  Bed Mobility Overal bed mobility: Needs Assistance Bed Mobility: Supine to Sit     Supine to sit: Min guard     General bed mobility comments: no physical assist  Transfers Overall transfer level: Needs assistance Equipment used: Rolling walker (2  wheeled) Transfers: Sit to/from Stand Sit to Stand: Mod assist;+2 safety/equipment         General transfer comment: Pt stood from slightly raised bed, pulled up on walker, assist to rise and steady, attempts to sit prematurely with poor control of descent  Ambulation/Gait Ambulation/Gait assistance: Min guard   Assistive device: Rolling walker (2 wheeled)       General Gait Details: took 3-4 steps over to chair with RW, unsteady and poor safety awareness noted    Stairs            Wheelchair Mobility    Modified Rankin (Stroke Patients Only)       Balance Overall balance assessment: Needs assistance Sitting-balance support: Bilateral upper extremity supported;Feet supported Sitting balance-Leahy Scale: Fair Sitting balance - Comments: poor trunk stability noted today, dependent on UE support      Standing balance-Leahy Scale: Poor Standing balance comment: dependent on UE support on walker                             Cognition Arousal/Alertness: Awake/alert Behavior During Therapy: WFL for tasks assessed/performed Overall Cognitive Status: No family/caregiver present to determine baseline cognitive functioning                                 General Comments: pleasant and conversing appropriately with therapists      Exercises      General Comments        Pertinent Vitals/Pain Pain Assessment: Faces Pain Score: 0-No  pain Faces Pain Scale: No hurt Pain Intervention(s): Limited activity within patient's tolerance;Monitored during session    [address] expects to be discharged to:: Private residence Living Arrangements: Parent;Other relatives(uncle) Available Help at Discharge: Family         Home Equipment: Gilford Rile - 4 wheels(borrowed from a family member) Additional Comments: Unable to obtain more information from patient    Prior Function Level of Independence: Needs assistance  Gait / Transfers  Assistance Needed: no device on "good days"; Rollator or cane on "bad days"; he tells me bad days are rare, and he usually doesn't need an assistive device ADL's / Homemaking Assistance Needed: tells me a family member cooks, not him     PT Goals (current goals can now be found in the care plan section) Acute Rehab PT Goals Patient Stated Goal: to go home PT Goal Formulation: With patient Time For Goal Achievement: 08/16/17 Potential to Achieve Goals: Good Progress towards PT goals: Progressing toward goals    Frequency    Min 3X/week      PT Plan Current plan remains appropriate    Co-evaluation PT/OT/SLP Co-Evaluation/Treatment: Yes Reason for Co-Treatment: For patient/therapist safety;Necessary to address cognition/behavior during functional activity PT goals addressed during session: Mobility/safety with mobility;Proper use of DME;Balance OT goals addressed during session: ADL's and self-care      AM-PAC PT "6 Clicks" Daily Activity  Outcome Measure  Difficulty turning over in bed (including adjusting bedclothes, sheets and blankets)?: Unable Difficulty moving from lying on back to sitting on the side of the bed? : Unable Difficulty sitting down on and standing up from a chair with arms (e.g., wheelchair, bedside commode, etc,.)?: Unable Help needed moving to and from a bed to chair (including a wheelchair)?: A Little Help needed walking in hospital room?: A Little Help needed climbing 3-5 steps with a railing? : A Lot 6 Click Score: 11    End of Session Equipment Utilized During Treatment: Gait belt Activity Tolerance: Patient tolerated treatment well Patient left: in chair;with call bell/phone within reach;with chair alarm set   PT Visit Diagnosis: Unsteadiness on feet (R26.81);Other abnormalities of gait and mobility (R26.89);Muscle weakness (generalized) (M62.81);Repeated falls (R29.6)     Time: 9038-3338 PT Time Calculation (min) (ACUTE ONLY): 20  min  Charges:  $Therapeutic Activity: 8-22 mins                    G Codes:       Deniece Ree PT, DPT, CBIS  Supplemental Physical Therapist [place]   Pager [phone]

## 2017-08-05 NOTE — Evaluation (Signed)
Occupational Therapy Evaluation Patient Details Name: Brandon [name] MRN: [id number] DOB: Dec 22, 1990 Today's Date: 08/05/2017    History of Present Illness 27 year old male with past medical history of schizophrenia, bipolar disorder and multiple sclerosis admitted for acute encephalopathy as well as weakness felt to be secondary to MS exacerbation area confusion felt to be secondary to extra trazodone patient had taken for sleep. Started on IV steroids and admitted to the hospitalist service.  Encephalopathy resolved soon after admission, felt to be secondary to excessive trazodone   Clinical Impression   Pt lives with his family and will have 24 hour care at home. Pt demonstrating difficulty with sit to stand, needing moderate assistance to rise and steady. Pt requires set up to moderate assistance for ADL. He presents with mild fine motor incoordination. Pt pleasant and cooperative, no family available to determine baseline cognition. Will follow acutely. Recommending home with HHOT.    Follow Up Recommendations  Home health OT;Supervision/Assistance - 24 hour    Equipment Recommendations  3 in 1 bedside commode    Recommendations for Other Services       Precautions / Restrictions Precautions Precautions: Fall Restrictions Weight Bearing Restrictions: No      Mobility Bed Mobility Overal bed mobility: Needs Assistance Bed Mobility: Supine to Sit     Supine to sit: Min guard     General bed mobility comments: no physical assist  Transfers Overall transfer level: Needs assistance Equipment used: Rolling walker (2 wheeled) Transfers: Sit to/from Stand Sit to Stand: Mod assist;+2 safety/equipment         General transfer comment: Pt stood from slightly raised bed, pulled up on walker, assist to rise and steady, attempts to sit prematurely with poor control of descent    Balance Overall balance assessment: Needs assistance   Sitting balance-Leahy Scale: Fair       Standing balance-Leahy Scale: Poor Standing balance comment: dependent on UE support on walker                            ADL either performed or assessed with clinical judgement   ADL Overall ADL's : Needs assistance/impaired Eating/Feeding: Set up;Sitting   Grooming: Wash/dry hands;Wash/dry face;Sitting;Set up   Upper Body Bathing: Minimal assistance;Sitting   Lower Body Bathing: Sit to/from stand;Moderate assistance   Upper Body Dressing : Sitting;Set up   Lower Body Dressing: Sit to/from stand;Moderate assistance   Toilet Transfer: Minimal assistance;Stand-pivot;RW Toilet Transfer Details (indicate cue type and reason): simulated to chair Toileting- Clothing Manipulation and Hygiene: Moderate assistance;Sit to/from stand               Vision Baseline Vision/History: Wears glasses Wears Glasses: At all times Patient Visual Report: No change from baseline       Perception     Praxis      Pertinent Vitals/Pain Pain Assessment: No/denies pain     Hand Dominance Right   Extremity/Trunk Assessment Upper Extremity Assessment Upper Extremity Assessment: RUE deficits/detail;LUE deficits/detail RUE Coordination: decreased fine motor LUE Coordination: decreased fine motor   Lower Extremity Assessment Lower Extremity Assessment: Defer to PT evaluation   Cervical / Trunk Assessment Cervical / Trunk Assessment: Normal   Communication Communication Communication: Expressive difficulties(at times, difficult to understand)   Cognition Arousal/Alertness: Awake/alert Behavior During Therapy: WFL for tasks assessed/performed Overall Cognitive Status: No family/caregiver present to determine baseline cognitive functioning  General Comments: pleasant and conversing appropriately with therapists   General Comments       Exercises     Shoulder Instructions      Home Living Family/patient expects to be  discharged to:: Private residence Living Arrangements: Parent;Other relatives(uncle) Available Help at Discharge: Family                         Home Equipment: Dan Humphreys - 4 wheels(borrowed from a family member)   Additional Comments: Unable to obtain more information from patient      Prior Functioning/Environment Level of Independence: Needs assistance  Gait / Transfers Assistance Needed: no device on "good days"; Rollator or cane on "bad days"; he tells me bad days are rare, and he usually doesn't need an assistive device ADL's / Homemaking Assistance Needed: tells me a family member cooks, not him            OT Problem List: Decreased strength;Decreased activity tolerance;Impaired balance (sitting and/or standing);Decreased coordination;Decreased cognition;Decreased safety awareness;Decreased knowledge of use of DME or AE;Obesity;Impaired UE functional use      OT Treatment/Interventions: Self-care/ADL training;DME and/or AE instruction;Patient/family education;Balance training;Therapeutic activities    OT Goals(Current goals can be found in the care plan section) Acute Rehab OT Goals Patient Stated Goal: to go home OT Goal Formulation: With patient Time For Goal Achievement: 08/19/17 Potential to Achieve Goals: Good ADL Goals Pt Will Perform Grooming: with supervision;standing Pt Will Perform Lower Body Bathing: with supervision;sit to/from stand Pt Will Perform Lower Body Dressing: with supervision;sit to/from stand Pt Will Transfer to Toilet: with supervision;ambulating;bedside commode(over toilet) Pt Will Perform Toileting - Clothing Manipulation and hygiene: with supervision;sit to/from stand  OT Frequency: Min 2X/week   Barriers to D/C:            Co-evaluation PT/OT/SLP Co-Evaluation/Treatment: Yes Reason for Co-Treatment: For patient/therapist safety;Necessary to address cognition/behavior during functional activity   OT goals addressed during  session: ADL's and self-care      AM-PAC PT "6 Clicks" Daily Activity     Outcome Measure Help from another person eating meals?: A Little Help from another person taking care of personal grooming?: A Little Help from another person toileting, which includes using toliet, bedpan, or urinal?: A Lot Help from another person bathing (including washing, rinsing, drying)?: A Lot Help from another person to put on and taking off regular upper body clothing?: A Little Help from another person to put on and taking off regular lower body clothing?: A Lot 6 Click Score: 15   End of Session Equipment Utilized During Treatment: Gait belt;Rolling walker Nurse Communication: Mobility status  Activity Tolerance: Patient tolerated treatment well Patient left: in chair;with call bell/phone within reach;with chair alarm set  OT Visit Diagnosis: Unsteadiness on feet (R26.81);Cognitive communication deficit (R41.841)                Time: 1610-9604 OT Time Calculation (min): 20 min Charges:  OT General Charges $OT Visit: 1 Visit OT Evaluation $OT Eval Moderate Complexity: 1 Mod G-Codes:     Evern Bio 08/05/2017, 9:20 AM  08/05/2017 Martie Round, OTR/L Pager: [phone]

## 2017-08-05 NOTE — Evaluation (Signed)
Speech Language Pathology Evaluation Patient Details Name: Brandon [name] MRN: [id number] DOB: Aug 04, 1990 Today's Date: 08/05/2017 Time: 7356-7014 SLP Time Calculation (min) (ACUTE ONLY): 18 min  Problem List:  Patient Active Problem List   Diagnosis Date Noted  . Hyperglycemia 07/31/2017  . Multiple sclerosis exacerbation (HCC) 07/30/2017  . Acute encephalopathy 07/30/2017  . Paranoid schizophrenia (HCC)   . Hallucinations 08/05/2014  . Auditory hallucination   . Left-sided weakness 01/18/2014  . Multiple sclerosis (HCC) 05/20/2013  . 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 Abnormality of gait 11/23/2012  . HTN (hypertension) 07/04/2011  . 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 OBESITY 01/14/2010  . ADHD 01/14/2010   Past Medical History:  Past Medical History:  Diagnosis Date  . ADHD (attention deficit hyperactivity disorder)   . Bipolar 1 disorder (HCC)   . Chronic back pain   . Chronic neck pain   . Hypertension   . Multiple sclerosis (HCC) 05/20/2013   left sided weakness, dysarthria  . Non-compliance   . Obesity   . Schizophrenia (HCC)   . White matter abnormality on MRI of brain 11/23/2012   Past Surgical History:  Past Surgical History:  Procedure Laterality Date  . None     HPI:  Brandon [name] a 27 y.o.malewith medical history significant ofMS, schizophrenia. [name] provides history due to AMS of patient.  Mother (Brandon [name]) reports that when she woke up this AM he had increased balance issues. Pt however was able to hold conversation. Around 10 pm she noted that patient was having increased difficulty to move around, and at 11 pm patient couldn't get up at all and was urinating on himself.  Pt has hx ofschizophreniaand takestrazodone,he was given 3 trazadone tablets prior to he went to sleep.(Normally supposed to take 2 tablets apparently).Family reports that patient continues to have auditoryhallucinations due to  schizophrenia.  MRI of the head is showing extensive cerebral white matter changes c/w known MS.  Chest xray showing clear lungs.     Assessment / Plan / Recommendation Clinical Impression  Pt's cognitive linguistic abilities appear within functional limits and appears to be at baseline per chart review. Pt able to recall immediate information from hospital stay. Pt's encephalopathy appears to have cleared at this time and no further skilled ST is indicated. ST to sign off.     SLP Assessment  SLP Recommendation/Assessment: All further Speech Lanaguage Pathology  needs can be addressed in the next venue of care SLP Visit Diagnosis: Cognitive communication deficit (R41.841)    Follow Up Recommendations  None    Frequency and Duration           SLP Evaluation Cognition  Overall Cognitive Status: No family/caregiver present to determine baseline cognitive functioning Arousal/Alertness: Awake/alert Orientation Level: Oriented X4       Comprehension  Auditory Comprehension Overall Auditory Comprehension: Appears within functional limits for tasks assessed    Expression Expression Primary Mode of Expression: Verbal Verbal Expression Overall Verbal Expression: Appears within functional limits for tasks assessed Written Expression Dominant Hand: Right Written Expression: Within Functional Limits   Oral / Motor  Oral Motor/Sensory Function Overall Oral Motor/Sensory Function: Mild impairment Motor Speech Overall Motor Speech: Appears within functional limits for tasks assessed Respiration: Within functional limits   GO                    Revanth Neidig 08/05/2017, 11:14 AM

## 2017-08-05 NOTE — Care Management Important Message (Signed)
Important Message  Patient Details  Name: Brandon [name] MRN: [id number] Date of Birth: 1991-02-20   Medicare Important Message Given:  Yes    Dorena Bodo 08/05/2017, 9:56 AM

## 2017-08-06 NOTE — Telephone Encounter (Signed)
I called the mother.  The prednisone should be tapered getting physical and occupational therapy into the house.

## 2017-08-06 NOTE — Telephone Encounter (Signed)
Pts mother is wanting to know if Dr Anne Hahn is planning on keeping the pt on a steroid until ocrevus is approved so he can stay mobile. Please call to advise  Pharm:[facility]

## 2017-08-07 ENCOUNTER — Telehealth: Payer: Self-pay | Admitting: Neurology

## 2017-08-07 NOTE — Telephone Encounter (Signed)
[facility] has informed me that they are unable to get his Ocrevus approved, unable to get patient assistance.  The patient has Medicaid only, not Medicare.  We will need to make a referral to [facility] outpatient infusion center.

## 2017-08-10 NOTE — Telephone Encounter (Signed)
Submitted PA Ocrevus to [employer] via fax at [phone]. Received fax confirmation. Marked PA as urgent. Waiting on determination.

## 2017-08-11 ENCOUNTER — Other Ambulatory Visit: Payer: Self-pay

## 2017-08-11 ENCOUNTER — Encounter (HOSPITAL_COMMUNITY): Payer: Self-pay | Admitting: Nurse Practitioner

## 2017-08-11 ENCOUNTER — Inpatient Hospital Stay (HOSPITAL_COMMUNITY)
Admission: EM | Admit: 2017-08-11 | Discharge: 2017-08-13 | DRG: 176 | Disposition: A | Discharge status: Home Health | Payer: Medicare Other | Attending: Internal Medicine | Admitting: Internal Medicine

## 2017-08-11 ENCOUNTER — Emergency Department (HOSPITAL_COMMUNITY): Payer: Medicare Other

## 2017-08-11 DIAGNOSIS — I10 Essential (primary) hypertension: Secondary | ICD-10-CM | POA: Diagnosis present

## 2017-08-11 DIAGNOSIS — R071 Chest pain on breathing: Secondary | ICD-10-CM | POA: Diagnosis not present

## 2017-08-11 DIAGNOSIS — R000 Tachycardia, unspecified: Secondary | ICD-10-CM | POA: Diagnosis present

## 2017-08-11 DIAGNOSIS — F319 Bipolar disorder, unspecified: Secondary | ICD-10-CM | POA: Diagnosis present

## 2017-08-11 DIAGNOSIS — I2699 Other pulmonary embolism without acute cor pulmonale: Secondary | ICD-10-CM | POA: Diagnosis present

## 2017-08-11 DIAGNOSIS — R440 Auditory hallucinations: Secondary | ICD-10-CM | POA: Diagnosis present

## 2017-08-11 DIAGNOSIS — Z79899 Other long term (current) drug therapy: Secondary | ICD-10-CM

## 2017-08-11 DIAGNOSIS — R2681 Unsteadiness on feet: Secondary | ICD-10-CM

## 2017-08-11 DIAGNOSIS — F201 Disorganized schizophrenia: Secondary | ICD-10-CM | POA: Diagnosis present

## 2017-08-11 DIAGNOSIS — F209 Schizophrenia, unspecified: Secondary | ICD-10-CM | POA: Diagnosis present

## 2017-08-11 DIAGNOSIS — Z9119 Patient's noncompliance with other medical treatment and regimen: Secondary | ICD-10-CM

## 2017-08-11 DIAGNOSIS — Z6841 Body Mass Index (BMI) 40.0 and over, adult: Secondary | ICD-10-CM

## 2017-08-11 DIAGNOSIS — F17210 Nicotine dependence, cigarettes, uncomplicated: Secondary | ICD-10-CM | POA: Diagnosis present

## 2017-08-11 DIAGNOSIS — F200 Paranoid schizophrenia: Secondary | ICD-10-CM | POA: Diagnosis present

## 2017-08-11 DIAGNOSIS — G8929 Other chronic pain: Secondary | ICD-10-CM | POA: Diagnosis present

## 2017-08-11 DIAGNOSIS — G35 Multiple sclerosis: Secondary | ICD-10-CM | POA: Diagnosis present

## 2017-08-11 DIAGNOSIS — R270 Ataxia, unspecified: Secondary | ICD-10-CM | POA: Diagnosis present

## 2017-08-11 DIAGNOSIS — F909 Attention-deficit hyperactivity disorder, unspecified type: Secondary | ICD-10-CM | POA: Diagnosis present

## 2017-08-11 DIAGNOSIS — R269 Unspecified abnormalities of gait and mobility: Secondary | ICD-10-CM

## 2017-08-11 LAB — CBC
HCT: 40.6 % (ref 39.0–52.0)
Hemoglobin: 13.5 g/dL (ref 13.0–17.0)
MCH: 25.5 pg — ABNORMAL LOW (ref 26.0–34.0)
MCHC: 33.3 g/dL (ref 30.0–36.0)
MCV: 76.7 fL — ABNORMAL LOW (ref 78.0–100.0)
Platelets: 162 10*3/uL (ref 150–400)
RBC: 5.29 MIL/uL (ref 4.22–5.81)
RDW: 15 % (ref 11.5–15.5)
WBC: 18.6 10*3/uL — ABNORMAL HIGH (ref 4.0–10.5)

## 2017-08-11 LAB — COMPREHENSIVE METABOLIC PANEL
ALT: 27 U/L (ref 17–63)
AST: 15 U/L (ref 15–41)
Albumin: 3.5 g/dL (ref 3.5–5.0)
Alkaline Phosphatase: 69 U/L (ref 38–126)
Anion gap: 8 (ref 5–15)
BUN: 19 mg/dL (ref 6–20)
CO2: 26 mmol/L (ref 22–32)
Calcium: 8.9 mg/dL (ref 8.9–10.3)
Chloride: 100 mmol/L — ABNORMAL LOW (ref 101–111)
Creatinine, Ser: 1.05 mg/dL (ref 0.61–1.24)
GFR calc Af Amer: >60 mL/min (ref >60)
GFR calc non Af Amer: >60 mL/min (ref >60)
Glucose, Bld: 112 mg/dL — ABNORMAL HIGH (ref 65–99)
Potassium: 3.9 mmol/L (ref 3.5–5.1)
Sodium: 134 mmol/L — ABNORMAL LOW (ref 135–145)
Total Bilirubin: 0.7 mg/dL (ref 0.3–1.2)
Total Protein: 7.5 g/dL (ref 6.5–8.1)

## 2017-08-11 LAB — PROTIME-INR
INR: 1.02
Prothrombin Time: 13.3 seconds (ref 11.4–15.2)

## 2017-08-11 LAB — I-STAT TROPONIN, ED: Troponin i, poc: 0 ng/mL (ref 0.00–0.08)

## 2017-08-11 LAB — BRAIN NATRIURETIC PEPTIDE: B Natriuretic Peptide: 13.7 pg/mL (ref 0.0–100.0)

## 2017-08-11 LAB — APTT: aPTT: 30 seconds (ref 24–36)

## 2017-08-11 LAB — D-DIMER, QUANTITATIVE (NOT AT ARMC): D-Dimer, Quant: 3.4 ug/mL-FEU — ABNORMAL HIGH (ref 0.00–0.50)

## 2017-08-11 MED ORDER — SODIUM CHLORIDE 0.9 % IJ SOLN
INTRAMUSCULAR | Status: AC
  Filled 2017-08-11: qty 50

## 2017-08-11 MED ORDER — HEPARIN BOLUS VIA INFUSION
5000.0000 [IU] | Freq: Once | INTRAVENOUS | Status: AC
  Administered 2017-08-11: 5000 [IU] via INTRAVENOUS
  Filled 2017-08-11: qty 5000

## 2017-08-11 MED ORDER — HEPARIN (PORCINE) IN NACL 100-0.45 UNIT/ML-% IJ SOLN
1800.0000 [IU]/h | INTRAMUSCULAR | Status: DC
  Administered 2017-08-11 – 2017-08-12 (×2): 1800 [IU]/h via INTRAVENOUS
  Filled 2017-08-11 (×3): qty 250

## 2017-08-11 MED ORDER — IOPAMIDOL (ISOVUE-370) INJECTION 76%
INTRAVENOUS | Status: AC
  Administered 2017-08-11: 100 mL via INTRAVENOUS
  Filled 2017-08-11: qty 100

## 2017-08-11 NOTE — Progress Notes (Signed)
ANTICOAGULATION CONSULT NOTE - Initial Consult  Pharmacy Consult for IV Heparin Indication: pulmonary embolus  No Known Allergies  Patient Measurements: Height: 6\' 1"  (185.4 cm) Weight: (!) 313 lb (142 kg) IBW/kg (Calculated) : 79.9 Heparin Dosing Weight: 99 kg  Vital Signs: Temp: 98.7 F (37.1 C) (02/05 1643) Temp Source: Oral (02/05 1643) BP: 162/128 (02/05 2242) Pulse Rate: 116 (02/05 2242)  Labs: Recent Labs    08/11/17 1859  HGB 13.5  HCT 40.6  PLT 162  CREATININE 1.05    Estimated Creatinine Clearance: 159.3 mL/min (by C-G formula based on SCr of 1.05 mg/dL).   Medical History: Past Medical History:  Diagnosis Date  . ADHD (attention deficit hyperactivity disorder)   . Bipolar 1 disorder (HCC)   . Chronic back pain   . Chronic neck pain   . Hypertension   . Multiple sclerosis (HCC) 05/20/2013   left sided weakness, dysarthria  . Non-compliance   . Obesity   . Schizophrenia (HCC)   . 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 heparin  5,000 Units Intravenous Once  . sodium chloride       Infusions:  . heparin      Assessment: 26 yoM c/o CP with left PE. IV heparin for PE.  Goal of Therapy:  Heparin level 0.3-0.7 units/ml Monitor platelets by anticoagulation protocol: Yes   Plan:  Baseline aptt and PT/INR STAT Heparin 5000 unit bolus x1 now  Start heparin drip at 1800 units/hr Daily CBC/HL check 1st HL in 6 hours  Lorenza Evangelist 08/11/2017,10:45 PM

## 2017-08-11 NOTE — Telephone Encounter (Signed)
I called to check on status of PA. Spoke with Kenard Gower w/ [employer]. Interaction ID for call: [id number]. He advised it was denied d/t patient having active coverage through Medicare Part D since 2015 ([employer]). Pt will need to contact case worker about this.

## 2017-08-11 NOTE — ED Provider Notes (Signed)
[facility]-EMERGENCY DEPT Provider Note   CSN: [id number] Arrival date & time: 08/11/17  1630     History   Chief Complaint No chief complaint on file.   HPI Brandon [name] is a 27 y.o. male with a h/o of MS, HTN, chronic neck and back pain, ADHD, schizophrenia, and Bipolar 1 Disorder who presents to the emergency department with a chief complaint of chest pain breath that began yesterday.  The patient reports the pain is constant, and improved from an 8 to a 3. Pain is worse when he takes a deep breath.   The patient's mother reports that the patient was discharged from the hospital on 08/04/17 flare and laid in bed at home until today.  She states that he was able to walk some earlier today.  She reports that he began complaining of chest pain yesterday.  He has hallucinations at baseline, and she reports that she overheard the patient talking to himself and yelling at someone and saying "they must be mad at me because they are hurting my heart."  She reports that she called EMS earlier today after she noted the patient to be dyspneic and gasping for breath.  She denies a recent cough or fever.  No history of PE or DVT.  Level V caveat secondary psychiatric disorder.   The history is provided by the patient, the EMS personnel and a parent. No language interpreter was used.    Past Medical History:  Diagnosis Date  . ADHD (attention deficit hyperactivity disorder)   . Bipolar 1 disorder (HCC)   . Chronic back pain   . Chronic neck pain   . Hypertension   . Multiple sclerosis (HCC) 05/20/2013   left sided weakness, dysarthria  . Non-compliance   . Obesity   . Schizophrenia (HCC)   . 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 Pulmonary embolism (HCC) 08/12/2017  . Hyperglycemia 07/31/2017  . Multiple sclerosis exacerbation (HCC) 07/30/2017  . Acute encephalopathy 07/30/2017  . Paranoid  schizophrenia (HCC)   . Hallucinations 08/05/2014  . Auditory hallucination   . Left-sided weakness 01/18/2014  . Multiple sclerosis (HCC) 05/20/2013  . 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 Abnormality of gait 11/23/2012  . HTN (hypertension) 07/04/2011  . 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 OBESITY 01/14/2010  . ADHD 01/14/2010    Past Surgical History:  Procedure Laterality Date  . None         Home Medications    Prior to Admission medications   Medication Sig Start Date End Date Taking? Authorizing Provider  amLODipine (NORVASC) 10 MG tablet Take 10 mg by mouth daily.   Yes [provider]  benztropine (COGENTIN) 1 MG tablet Take 1 tablet (1 mg total) by mouth 2 (two) times daily. Patient taking differently: Take 1 mg by mouth daily.  04/02/15  Yes Charm Rings, NP  cloZAPine (CLOZARIL) 25 MG tablet Take 25 mg by mouth 2 (two) times daily.    Yes [provider]  INVEGA SUSTENNA 234 MG/1.5ML SUSP injection Inject 234 mg into the muscle every 30 (thirty) days.  05/15/17  Yes [provider]  Melatonin 5 MG TABS Take 10 mg by mouth at bedtime.    Yes [provider]  propranolol (INDERAL) 10 MG tablet Take 1 tablet (10 mg total) by mouth 2 (two) times daily. 05/03/15  Yes Charm Rings,  NP  traZODone (DESYREL) 100 MG tablet Take 200 mg by mouth at bedtime as needed for sleep.  05/14/15  Yes [provider]  predniSONE (DELTASONE) 10 MG tablet Begin taking 6 tablets daily, taper by one tablet every other day until off the medication. Patient not taking: Reported on 08/11/2017 07/02/17   York Spaniel, MD    Family History Family History  Problem Relation Age of Onset  . Diabetes Mother   . ADD / ADHD Brother     Social History Social History   Tobacco Use  . Smoking status: Current Every Day Smoker    Packs/day: 0.25    Types: Cigarettes  . Smokeless tobacco: Never Used  . Tobacco comment: 4 cigarettes a day    Substance Use Topics  . Alcohol use: Yes    Alcohol/week: 0.0 oz    Comment: "A little bit"   . Drug use: Yes    Types: Marijuana    Comment: Last used: unknown      Allergies   Patient has no known allergies.   Review of Systems Review of Systems  Unable to perform ROS: Psychiatric disorder  Constitutional: Negative for fever.  Respiratory: Positive for shortness of breath. Negative for cough.   Cardiovascular: Positive for chest pain.  Gastrointestinal: Negative for diarrhea and vomiting.   Physical Exam Updated Vital Signs BP (!) 171/106   Pulse 88   Temp 98.7 F (37.1 C) (Oral)   Resp 19   Ht 6\' 1"  (1.854 m)   Wt (!) 142 kg (313 lb)   SpO2 95%   BMI 41.30 kg/m   Physical Exam  Constitutional: He appears well-developed.  HENT:  Head: Normocephalic.  Eyes: Conjunctivae are normal.  Neck: Normal range of motion. Neck supple. No JVD present.  Cardiovascular: Normal rate, regular rhythm, normal heart sounds and intact distal pulses. Exam reveals no gallop and no friction rub.  No murmur heard. Pulmonary/Chest: Effort normal and breath sounds normal. No stridor. No respiratory distress. He has no wheezes. He has no rales. He exhibits no tenderness.  Abdominal: Soft. Bowel sounds are normal. He exhibits no distension and no mass. There is no tenderness. There is no rebound and no guarding. No hernia.  Musculoskeletal: Normal range of motion. He exhibits no edema, tenderness or deformity.  1+ pitting edema to the bilateral lower extremities.  DP and PT pulses are 2+ and symmetric.  Neurological: He is alert.  Skin: Skin is warm and dry.  Psychiatric: His affect is blunt. He expresses inappropriate judgment. He is inattentive.  Nursing note and vitals reviewed.   ED Treatments / Results  Labs (all labs ordered are listed, but only abnormal results are displayed) Labs Reviewed  D-DIMER, QUANTITATIVE (NOT AT [facility]) - Abnormal; Notable for the following components:       Result Value   D-Dimer, Quant 3.40 (*)    All other components within normal limits  CBC - Abnormal; Notable for the following components:   WBC 18.6 (*)    MCV 76.7 (*)    MCH 25.5 (*)    All other components within normal limits  COMPREHENSIVE METABOLIC PANEL - Abnormal; Notable for the following components:   Sodium 134 (*)    Chloride 100 (*)    Glucose, Bld 112 (*)    All other components within normal limits  BRAIN NATRIURETIC PEPTIDE  APTT  PROTIME-INR  HEPARIN LEVEL (UNFRACTIONATED)  CBC  URINALYSIS, ROUTINE W REFLEX MICROSCOPIC  I-STAT TROPONIN, ED  EKG  EKG Interpretation None       Radiology Dg Chest 2 View  Result Date: 08/11/2017 CLINICAL DATA:  Shortness of breath and chest pain. EXAM: CHEST  2 VIEW COMPARISON:  03/30/2017 and prior radiograph FINDINGS: This is a low volume film with bilateral mid-lower lung opacities. Cardiomediastinal silhouette is unremarkable. No pleural effusion or pneumothorax. No acute abnormalities identified. IMPRESSION: Low volume film with bilateral mid-lower lung opacities - favor atelectasis but airspace disease/pneumonia not excluded. Electronically Signed   By: Harmon Pier M.D.   On: 08/11/2017 18:09   Ct Angio Chest Pe W And/or Wo Contrast  Result Date: 08/11/2017 CLINICAL DATA:  Chest pain. EXAM: CT ANGIOGRAPHY CHEST WITH CONTRAST TECHNIQUE: Multidetector CT imaging of the chest was performed using the standard protocol during bolus administration of intravenous contrast. Multiplanar CT image reconstructions and MIPs were obtained to evaluate the vascular anatomy. CONTRAST:  ISOVUE-370 IOPAMIDOL (ISOVUE-370) INJECTION 76% COMPARISON:  Radiographs of same day. FINDINGS: Cardiovascular: Filling defects are noted in the upper lobe branches of the left pulmonary artery consistent with pulmonary embolus. Evaluation of the lower lobe branches is limited due to respiratory motion artifact. Normal cardiac size. No pericardial  effusion is noted. Mediastinum/Nodes: No enlarged mediastinal, hilar, or axillary lymph nodes. Thyroid gland, trachea, and esophagus demonstrate no significant findings. Lungs/Pleura: No pneumothorax or pleural effusion is noted. Mild bibasilar subsegmental atelectasis is noted. Upper Abdomen: No acute abnormality. Musculoskeletal: No chest wall abnormality. No acute or significant osseous findings. Review of the MIP images confirms the above findings. IMPRESSION: Filling defect is seen in upper lobe branches of left pulmonary artery consistent with pulmonary embolus. Critical Value/emergent results were called by telephone at the time of interpretation on 08/11/2017 at 9:52 pm to Dr. Pedro Earls [facility] , who verbally acknowledged these results. Mild bibasilar subsegmental atelectasis is noted. Electronically Signed   By: Lupita Raider, M.D.   On: 08/11/2017 21:53    Procedures .Critical Care Performed by: Barkley Boards, PA-C Authorized by: Barkley Boards, PA-C   Critical care provider statement:    Critical care time (minutes):  45   Critical care time was exclusive of:  Separately billable procedures and treating other patients   Critical care was necessary to treat or prevent imminent or life-threatening deterioration of the following conditions:  Respiratory failure   Critical care was time spent personally by me on the following activities:  Ordering and performing treatments and interventions, ordering and review of laboratory studies, ordering and review of radiographic studies, pulse oximetry, re-evaluation of patient's condition, review of old charts, obtaining history from patient or surrogate and examination of patient   I assumed direction of critical care for this patient from another provider in my specialty: no     (including critical care time)  Medications Ordered in ED Medications  sodium chloride 0.9 % injection (not administered)  heparin ADULT infusion 100 units/mL (25000  units/268mL sodium chloride 0.45%) (1,800 Units/hr Intravenous New Bag/Given 08/11/17 2305)  iopamidol (ISOVUE-370) 76 % injection (100 mLs Intravenous Contrast Given 08/11/17 2117)  heparin bolus via infusion 5,000 Units (5,000 Units Intravenous Bolus from Bag 08/11/17 2306)     Initial Impression / Assessment and Plan / ED Course  I have reviewed the triage vital signs and the nursing notes.  Pertinent labs & imaging results that were available during my care of the patient were reviewed by me and considered in my medical decision making (see chart for details).  27 year old male with a h/o of MS, HTN, chronic neck and back pain, ADHD, schizophrenia, and Bipolar 1 Disorder presenting with chest pain and dyspnea since yesterday.  He was discharged on 08/04/2017 and is essentially been bedbound since discharge.  Patient was seen and evaluated by Dr. Clarice Pole, attending physician.  On physical exam, lungs are clear to auscultation bilaterally.  No reproducible chest pain with palpation of the anterior chest wall.  Leukocytosis 18.6.  Troponin is negative.  BNP is 13.7.  EKG with sinus rhythm, but Q waves and inverted T waves are noted in lead III.  No S waves in lead I.  D-dimer 3.40 CT angio with a filling defect in the upper lobe branches of the left pulmonary artery consistent with pulmonary embolism.  No right heart strain noted.  Heparin initiated per pharmacy.  Contacted the patient's legal guardian, his mother, after arrival to the emergency department for clarification about history of present illness and spoke with her a second time after the results from the CT angioma received.  Dr. Mariea Clonts from the hospitalist team will admit. The patient appears reasonably stabilized for admission considering the current resources, flow, and capabilities available in the ED at this time, and I doubt any other [facility] requiring further screening and/or treatment in the ED prior to admission.  Final Clinical  Impressions(s) / ED Diagnoses   Final diagnoses:  Other acute pulmonary embolism without acute [facility])    ED Discharge Orders    None       Barkley Boards, PA-C 08/12/17 1610    Arby Barrette, MD 08/16/17 [phone]

## 2017-08-11 NOTE — Telephone Encounter (Signed)
Spoke with intrafusion here at [facility]. They contacted patient and spoke with pt family. They are going to contact their social worker to get a copy of pharmacy benefit card and get that sent to our office. They will re-submit patient for Ocrevus approval to be infused here at [facility] once they receive this information.

## 2017-08-11 NOTE — ED Provider Notes (Signed)
Medical screening examination/treatment/procedure(s) were conducted as a shared visit with non-physician practitioner(s) and myself.  I personally evaluated the patient during the encounter.   EKG Interpretation Sinus rhythm 103 PR 122 QRS 86 Axis XVIII.  Sinus tachycardia otherwise normal.    Has complex medical history including MS, hypertension, ADHD and schizophrenia.  Patient reports chest pain.  He also reports he feels short of breath.  Patient is alert and nontoxic.  He does not have respiratory distress at rest.  Lungs are clear.  Heart is regular borderline tachycardia.  No peripheral edema or calf tenderness.  I agree with plan and management.   Arby Barrette, MD 08/11/17 2321

## 2017-08-11 NOTE — ED Notes (Signed)
Bed: WA25 Expected date:  Expected time:  Means of arrival:  Comments: Hold for TCU 27 

## 2017-08-11 NOTE — ED Triage Notes (Signed)
`  Patient brought in by EMS for chest pain for 2 days only when he breaths deep. Patient is having hallucinations which is normal for him. Mother has guardian over him and states he is at baseline.Denies fever or cough.

## 2017-08-11 NOTE — ED Notes (Signed)
Bed: WA27 Expected date:  Expected time:  Means of arrival:  Comments: EMS-behavioral

## 2017-08-12 ENCOUNTER — Inpatient Hospital Stay (HOSPITAL_COMMUNITY): Payer: Medicare Other

## 2017-08-12 DIAGNOSIS — I2699 Other pulmonary embolism without acute cor pulmonale: Secondary | ICD-10-CM

## 2017-08-12 DIAGNOSIS — R440 Auditory hallucinations: Secondary | ICD-10-CM

## 2017-08-12 DIAGNOSIS — R270 Ataxia, unspecified: Secondary | ICD-10-CM | POA: Diagnosis present

## 2017-08-12 DIAGNOSIS — R269 Unspecified abnormalities of gait and mobility: Secondary | ICD-10-CM | POA: Diagnosis not present

## 2017-08-12 DIAGNOSIS — F200 Paranoid schizophrenia: Secondary | ICD-10-CM

## 2017-08-12 DIAGNOSIS — I10 Essential (primary) hypertension: Secondary | ICD-10-CM | POA: Diagnosis present

## 2017-08-12 DIAGNOSIS — Z86711 Personal history of pulmonary embolism: Secondary | ICD-10-CM | POA: Insufficient documentation

## 2017-08-12 DIAGNOSIS — F17210 Nicotine dependence, cigarettes, uncomplicated: Secondary | ICD-10-CM | POA: Diagnosis present

## 2017-08-12 DIAGNOSIS — F1290 Cannabis use, unspecified, uncomplicated: Secondary | ICD-10-CM | POA: Diagnosis not present

## 2017-08-12 DIAGNOSIS — Z79899 Other long term (current) drug therapy: Secondary | ICD-10-CM | POA: Diagnosis not present

## 2017-08-12 DIAGNOSIS — F319 Bipolar disorder, unspecified: Secondary | ICD-10-CM | POA: Diagnosis present

## 2017-08-12 DIAGNOSIS — R000 Tachycardia, unspecified: Secondary | ICD-10-CM | POA: Diagnosis present

## 2017-08-12 DIAGNOSIS — Z6841 Body Mass Index (BMI) 40.0 and over, adult: Secondary | ICD-10-CM | POA: Diagnosis not present

## 2017-08-12 DIAGNOSIS — F909 Attention-deficit hyperactivity disorder, unspecified type: Secondary | ICD-10-CM | POA: Diagnosis present

## 2017-08-12 DIAGNOSIS — R071 Chest pain on breathing: Secondary | ICD-10-CM | POA: Diagnosis present

## 2017-08-12 DIAGNOSIS — Z818 Family history of other mental and behavioral disorders: Secondary | ICD-10-CM | POA: Diagnosis not present

## 2017-08-12 DIAGNOSIS — G35 Multiple sclerosis: Secondary | ICD-10-CM

## 2017-08-12 DIAGNOSIS — Z9119 Patient's noncompliance with other medical treatment and regimen: Secondary | ICD-10-CM | POA: Diagnosis not present

## 2017-08-12 DIAGNOSIS — G8929 Other chronic pain: Secondary | ICD-10-CM | POA: Diagnosis present

## 2017-08-12 LAB — DIFFERENTIAL
Basophils Absolute: 0 10*3/uL (ref 0.0–0.1)
Basophils Relative: 0 %
Eosinophils Absolute: 0.3 10*3/uL (ref 0.0–0.7)
Eosinophils Relative: 2 %
Lymphocytes Relative: 17 %
Lymphs Abs: 2.6 10*3/uL (ref 0.7–4.0)
Monocytes Absolute: 1.3 10*3/uL — ABNORMAL HIGH (ref 0.1–1.0)
Monocytes Relative: 9 %
Neutro Abs: 10.8 10*3/uL — ABNORMAL HIGH (ref 1.7–7.7)
Neutrophils Relative %: 72 %

## 2017-08-12 LAB — URINALYSIS, ROUTINE W REFLEX MICROSCOPIC
Bilirubin Urine: NEGATIVE
Glucose, UA: NEGATIVE mg/dL
Hgb urine dipstick: NEGATIVE
Ketones, ur: NEGATIVE mg/dL
Leukocytes, UA: NEGATIVE
Nitrite: NEGATIVE
Protein, ur: NEGATIVE mg/dL
Specific Gravity, Urine: 1.035 — ABNORMAL HIGH (ref 1.005–1.030)
pH: 6 (ref 5.0–8.0)

## 2017-08-12 LAB — CBC
HCT: 39.2 % (ref 39.0–52.0)
Hemoglobin: 13.1 g/dL (ref 13.0–17.0)
MCH: 25.4 pg — ABNORMAL LOW (ref 26.0–34.0)
MCHC: 33.4 g/dL (ref 30.0–36.0)
MCV: 76 fL — ABNORMAL LOW (ref 78.0–100.0)
Platelets: 157 10*3/uL (ref 150–400)
RBC: 5.16 MIL/uL (ref 4.22–5.81)
RDW: 15 % (ref 11.5–15.5)
WBC: 15 10*3/uL — ABNORMAL HIGH (ref 4.0–10.5)

## 2017-08-12 MED ORDER — HEPARIN BOLUS VIA INFUSION
3000.0000 [IU] | Freq: Once | INTRAVENOUS | Status: AC
  Administered 2017-08-12: 3000 [IU] via INTRAVENOUS
  Filled 2017-08-12: qty 3000

## 2017-08-12 MED ORDER — APIXABAN 5 MG PO TABS
10.0000 mg | ORAL_TABLET | Freq: Two times a day (BID) | ORAL | Status: DC
  Administered 2017-08-12 – 2017-08-13 (×3): 10 mg via ORAL
  Filled 2017-08-12 (×3): qty 2

## 2017-08-12 MED ORDER — TRAZODONE HCL 100 MG PO TABS
200.0000 mg | ORAL_TABLET | Freq: Every evening | ORAL | Status: DC | PRN
  Administered 2017-08-12: 200 mg via ORAL
  Filled 2017-08-12: qty 2

## 2017-08-12 MED ORDER — POLYETHYLENE GLYCOL 3350 17 G PO PACK
17.0000 g | PACK | Freq: Every day | ORAL | Status: DC
  Administered 2017-08-13: 17 g via ORAL
  Filled 2017-08-12: qty 1

## 2017-08-12 MED ORDER — CLOZAPINE 25 MG PO TABS
25.0000 mg | ORAL_TABLET | Freq: Two times a day (BID) | ORAL | Status: DC
  Administered 2017-08-12 – 2017-08-13 (×3): 25 mg via ORAL
  Filled 2017-08-12 (×3): qty 1

## 2017-08-12 MED ORDER — INFLUENZA VAC SPLIT QUAD 0.5 ML IM SUSY: 0.5000 mL | PREFILLED_SYRINGE | INTRAMUSCULAR | Status: DC | PRN

## 2017-08-12 MED ORDER — BENZTROPINE MESYLATE 1 MG PO TABS
1.0000 mg | ORAL_TABLET | Freq: Every day | ORAL | Status: DC
  Administered 2017-08-12 – 2017-08-13 (×2): 1 mg via ORAL
  Filled 2017-08-12 (×2): qty 1

## 2017-08-12 MED ORDER — PROPRANOLOL HCL 20 MG PO TABS
10.0000 mg | ORAL_TABLET | Freq: Two times a day (BID) | ORAL | Status: DC
  Administered 2017-08-12 – 2017-08-13 (×3): 10 mg via ORAL
  Filled 2017-08-12 (×3): qty 1

## 2017-08-12 MED ORDER — APIXABAN 5 MG PO TABS: 5.0000 mg | ORAL_TABLET | Freq: Two times a day (BID) | ORAL | Status: DC

## 2017-08-12 MED ORDER — AMLODIPINE BESYLATE 10 MG PO TABS
10.0000 mg | ORAL_TABLET | Freq: Every day | ORAL | Status: DC
  Administered 2017-08-12 – 2017-08-13 (×2): 10 mg via ORAL
  Filled 2017-08-12 (×3): qty 1

## 2017-08-12 NOTE — Progress Notes (Addendum)
Patient has been talking to individuals not in the room through out the day. Occasionally he yells as if he is mad at someone but it is not directed at staff. This evening this RN witness patient having conversation when no one was in room. When I entered the room he started hitting himself on the left side of his head, multiple times. I do not see any injury at this time.

## 2017-08-12 NOTE — H&P (Signed)
History and Physical    Brandon [name] GNF:[id number] DOB: 1991/06/07 DOA: 08/11/2017  PCP: Devra Dopp, MD   Patient coming from: Home   Chief Complaint: Chest pain  HPI: Brandon [name] is a 27 y.o. male with medical history significant for Multiple sclerosis, hx of Ataxia, schizophrenia with hallucinations, HTN, ADHD,.  Family not present at bedside but patient is able to give me  Some history . Pt presented to the ED today from home Via EMS with complaints of chest pain, that started yesterday starting in onset with associated shortness of breath in his right upper chest, reports increased pain with deep breathing and movement.  Chest pain worsened today, per chart patient's mother called EMS when she noticed patient was dyspneic and gasping for breath. Patient denies fever or chills , no cough, denies dysuria. Patient was admitted 1/24- 1/29-managed for metabolic encephalopathy thought to be due to extra trazodone use, and weakness 2/2 multiple sclerosis exacerbation. He was discharged home on steroid taper. Since discharge from hospital patient reports he has been lying in bed most of the day and has hardly not walked.   ED Course: Mild tachycardia up to 116, blood pressure reassuring systolic 130s-150s, O2 sats greater than 97% on room air.  D-dimer was elevated at 3.4, globin at baseline 13.5,  Elevated WBC 18.6.  Troponin BNP unremarkable, EKG nonacute. CT chest-upper lobe branches of left pulmonary artery filling defect.  Started on heparin per pharmacy consult in the ED. Hospitalist was called to admit for pulmonary embolism.  Review of Systems: As per HPI otherwise 10 point review of systems negative.   Past Medical History:  Diagnosis Date  . ADHD (attention deficit hyperactivity disorder)   . Bipolar 1 disorder (HCC)   . Chronic back pain   . Chronic neck pain   . Hypertension   . Multiple sclerosis (HCC) 05/20/2013   left sided weakness, dysarthria  . Non-compliance     . Obesity   . Schizophrenia (HCC)   . White matter abnormality on MRI of brain 11/23/2012    Past Surgical History:  Procedure Laterality Date  . None      reports that he has been smoking cigarettes.  He has been smoking about 0.25 packs per day. he has never used smokeless tobacco. He reports that he drinks alcohol. He reports that he uses drugs. Drug: Marijuana.  No Known Allergies  Family History  Problem Relation Age of Onset  . Diabetes Mother   . ADD / ADHD Brother     Prior to Admission medications   Medication Sig Start Date End Date Taking? Authorizing Provider  amLODipine (NORVASC) 10 MG tablet Take 10 mg by mouth daily.   Yes [provider]  benztropine (COGENTIN) 1 MG tablet Take 1 tablet (1 mg total) by mouth 2 (two) times daily. Patient taking differently: Take 1 mg by mouth daily.  04/02/15  Yes Charm Rings, NP  cloZAPine (CLOZARIL) 25 MG tablet Take 25 mg by mouth 2 (two) times daily.    Yes [provider]  INVEGA SUSTENNA 234 MG/1.5ML SUSP injection Inject 234 mg into the muscle every 30 (thirty) days.  05/15/17  Yes [provider]  Melatonin 5 MG TABS Take 10 mg by mouth at bedtime.    Yes [provider]  propranolol (INDERAL) 10 MG tablet Take 1 tablet (10 mg total) by mouth 2 (two) times daily. 05/03/15  Yes Charm Rings, NP  traZODone (DESYREL) 100 MG tablet  Take 200 mg by mouth at bedtime as needed for sleep.  05/14/15  Yes [provider]  predniSONE (DELTASONE) 10 MG tablet Begin taking 6 tablets daily, taper by one tablet every other day until off the medication. Patient not taking: Reported on 08/11/2017 07/02/17   York Spaniel, MD    Physical Exam: Vitals:   08/11/17 1645 08/11/17 2014 08/11/17 2242 08/11/17 2253  BP:  (!) 133/104 (!) 162/128 (!) 157/101  Pulse:  (!) 108 (!) 116 (!) 105  Resp:  18 18   Temp:      TempSrc:      SpO2:  100% 90% 100%  Weight: (!) 142 kg (313 lb)     Height: 6'  1" (1.854 m)       Constitutional: NAD, calm, comfortable Vitals:   08/11/17 1645 08/11/17 2014 08/11/17 2242 08/11/17 2253  BP:  (!) 133/104 (!) 162/128 (!) 157/101  Pulse:  (!) 108 (!) 116 (!) 105  Resp:  18 18   Temp:      TempSrc:      SpO2:  100% 90% 100%  Weight: (!) 142 kg (313 lb)     Height: 6\' 1"  (1.854 m)      Eyes: PERRL, lids and conjunctivae normal ENMT: Mucous membranes are moist.  Neck: normal, supple, no masses, no thyromegaly Respiratory: clear to auscultation bilaterally, no wheezing, no crackles. Normal respiratory effort. No accessory muscle use.  Cardiovascular: Regular rate and rhythm, no murmurs / rubs / gallops. No extremity edema. 2+ pedal pulses. Abdomen: no tenderness, no masses palpated. No hepatosplenomegaly. Bowel sounds positive.  Musculoskeletal: no clubbing / cyanosis. No joint deformity upper and lower extremities. Good ROM, no contractures. Normal muscle tone. No redness or tenderness. Skin: no rashes, lesions, ulcers. No induration Neurologic: CN 2-12 grossly intact. . Strength 5/5 in all 4.  rapid alternating movements - poor co-ordination bilateral upper extremities. Psychiatric: Normal judgment and insight. Alert and oriented x 3. Normal mood.   Labs on Admission: I have personally reviewed following labs and imaging studies  CBC: Recent Labs  Lab 08/11/17 1859  WBC 18.6*  HGB 13.5  HCT 40.6  MCV 76.7*  PLT 162   Basic Metabolic Panel: Recent Labs  Lab 08/11/17 1859  NA 134*  K 3.9  CL 100*  CO2 26  GLUCOSE 112*  BUN 19  CREATININE 1.05  CALCIUM 8.9   Liver Function Tests: Recent Labs  Lab 08/11/17 1859  AST 15  ALT 27  ALKPHOS 69  BILITOT 0.7  PROT 7.5  ALBUMIN 3.5   Coagulation Profile: Recent Labs  Lab 08/11/17 2253  INR 1.02   CBG: Recent Labs  Lab 08/05/17 0746 08/05/17 1242  GLUCAP 187* 289*   Urine analysis:    Component Value Date/Time   COLORURINE STRAW (A) 07/30/2017 0530   APPEARANCEUR  CLEAR 07/30/2017 0530   LABSPEC 1.009 07/30/2017 0530   PHURINE 7.0 07/30/2017 0530   GLUCOSEU NEGATIVE 07/30/2017 0530   HGBUR NEGATIVE 07/30/2017 0530   HGBUR negative 01/14/2010 1451   BILIRUBINUR NEGATIVE 07/30/2017 0530   KETONESUR NEGATIVE 07/30/2017 0530   PROTEINUR NEGATIVE 07/30/2017 0530   UROBILINOGEN 1.0 03/13/2015 0223   NITRITE NEGATIVE 07/30/2017 0530   LEUKOCYTESUR NEGATIVE 07/30/2017 0530    Radiological Exams on Admission: Dg Chest 2 View  Result Date: 08/11/2017 CLINICAL DATA:  Shortness of breath and chest pain. EXAM: CHEST  2 VIEW COMPARISON:  03/30/2017 and prior radiograph FINDINGS: This is a low volume  film with bilateral mid-lower lung opacities. Cardiomediastinal silhouette is unremarkable. No pleural effusion or pneumothorax. No acute abnormalities identified. IMPRESSION: Low volume film with bilateral mid-lower lung opacities - favor atelectasis but airspace disease/pneumonia not excluded. Electronically Signed   By: Harmon Pier M.D.   On: 08/11/2017 18:09   Ct Angio Chest Pe W And/or Wo Contrast  Result Date: 08/11/2017 CLINICAL DATA:  Chest pain. EXAM: CT ANGIOGRAPHY CHEST WITH CONTRAST TECHNIQUE: Multidetector CT imaging of the chest was performed using the standard protocol during bolus administration of intravenous contrast. Multiplanar CT image reconstructions and MIPs were obtained to evaluate the vascular anatomy. CONTRAST:  ISOVUE-370 IOPAMIDOL (ISOVUE-370) INJECTION 76% COMPARISON:  Radiographs of same day. FINDINGS: Cardiovascular: Filling defects are noted in the upper lobe branches of the left pulmonary artery consistent with pulmonary embolus. Evaluation of the lower lobe branches is limited due to respiratory motion artifact. Normal cardiac size. No pericardial effusion is noted. Mediastinum/Nodes: No enlarged mediastinal, hilar, or axillary lymph nodes. Thyroid gland, trachea, and esophagus demonstrate no significant findings. Lungs/Pleura: No  pneumothorax or pleural effusion is noted. Mild bibasilar subsegmental atelectasis is noted. Upper Abdomen: No acute abnormality. Musculoskeletal: No chest wall abnormality. No acute or significant osseous findings. Review of the MIP images confirms the above findings. IMPRESSION: Filling defect is seen in upper lobe branches of left pulmonary artery consistent with pulmonary embolus. Critical Value/emergent results were called by telephone at the time of interpretation on 08/11/2017 at 9:52 pm to Dr. Pedro Earls [facility] , who verbally acknowledged these results. Mild bibasilar subsegmental atelectasis is noted. Electronically Signed   By: Lupita Raider, M.D.   On: 08/11/2017 21:53    EKG: Independently reviewed. Sinus tach.  Assessment/Plan Principal Problem:   Pulmonary embolism (HCC) Active Problems:   Ataxia   HTN (hypertension)   Abnormality of gait   Multiple sclerosis (HCC)   Auditory hallucination   Paranoid schizophrenia (HCC)   Pulmonary Embolism- Chest pain, SOB.  Would consider this provoked due to immobility but might be a persistent risk factor.  Nonambulatory over the past 2 weeks. Ct Chest-filling defects upper lobe branches of left pulmonary artery. Heparin started in ED. Hemodynamically stable at this time, BNP, i-STAT drawn in ED troponin reassuringly unremarkable. - Heparin per pharmacy - Can switch to NOACs A.m - Bilat lower extremity dopplers  Hx of Ataxia, abnormality of gait, MS-patient reports he has not walked in 2 weeks, since hospital admission.  Recent admission for weakness, thought to be secondary to multiple sclerosis suggested by MRI. 5/5 strength all extremities, but with poor coordination. -PT evaluation  HTN- Bp elevated.  - Resume home Norvasc 10 mg a.m. with stable blood pressure  Schizophrenia-baseline patient has hallucinations -Continue home clozapine, trazodone QHS PRN  DVT prophylaxis: heparin Code Status:  Full  Family Communication: None at  bedside  Disposition Plan: 2-3 days Consults called:  None  Admission status:  Inpt, tele    Onnie Boer MD [facility] Pager [phone]  If 6PM-2AM, please contact night-coverage [email or web address] Password [email or web address]  08/12/2017, 12:55 AM

## 2017-08-12 NOTE — Progress Notes (Signed)
PT Cancellation Note  Patient Details Name: Brandon [name] MRN: [id number] DOB: 1990/10/01   Cancelled Treatment:    Reason Eval/Treat Not Completed: Patient not medically ready.+ for PE, started hep, to R/O DVT per RN, check back another time when cleared for mobility.    Rada Hay 08/12/2017, 8:51 AM Blanchard Kelch PT [phone]

## 2017-08-12 NOTE — Care Management Note (Signed)
Case Management Note  Patient Details  Name: Brandon [name] MRN: [id number] Date of Birth: 1991-06-28  Subjective/Objective:                  Pulmonary emboli and sepsis  Action/Plan: Date: August 12, 2017 Marcelle Smiling, BSN, [place], [place] [phone] Chart and notes review for patient progress and needs. Will follow for case management and discharge needs. No cm or discharge needs present at time of this review. Next review date: [id number]  Expected Discharge Date:                  Expected Discharge Plan:  Home w Home Health Services  In-House Referral:     Discharge planning Services  CM Consult  Post Acute Care Choice:  Resumption of Svcs/PTA Provider Choice offered to:     DME Arranged:    DME Agency:     HH Arranged:  PT, OT, Social Work, Nurse's Aide HH Agency:  [facility]  Status of Service:  In process, will continue to follow  If discussed at Long Length of Stay Meetings, dates discussed:    Additional Comments:  Golda Acre, RN 08/12/2017, 8:51 AM

## 2017-08-12 NOTE — Progress Notes (Signed)
ANTICOAGULATION CONSULT NOTE - Follow Up  Pharmacy Consult for IV Heparin - change to apixaban 2/6 Indication: pulmonary embolus  No Known Allergies  Patient Measurements: Height: 6\' 1"  (185.4 cm) Weight: (!) 313 lb (142 kg) IBW/kg (Calculated) : 79.9 Heparin Dosing Weight: 99 kg  Vital Signs: Temp: 97.5 F (36.4 C) (02/06 0739) Temp Source: Oral (02/06 0739) BP: 126/93 (02/06 0739) Pulse Rate: 99 (02/06 0739)  Labs: Recent Labs    08/11/17 1859 08/11/17 2253  HGB 13.5  --   HCT 40.6  --   PLT 162  --   APTT  --  30  LABPROT  --  13.3  INR  --  1.02  CREATININE 1.05  --     Estimated Creatinine Clearance: 159.3 mL/min (by C-G formula based on SCr of 1.05 mg/dL).   Medical History: Past Medical History:  Diagnosis Date  . ADHD (attention deficit hyperactivity disorder)   . Bipolar 1 disorder (HCC)   . Chronic back pain   . Chronic neck pain   . Hypertension   . Multiple sclerosis (HCC) 05/20/2013   left sided weakness, dysarthria  . Non-compliance   . Obesity   . Schizophrenia (HCC)   . 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 amLODipine  10 mg Oral Daily  . benztropine  1 mg Oral Daily  . cloZAPine  25 mg Oral BID  . propranolol  10 mg Oral BID   Infusions:  . heparin 1,800 Units/hr (08/12/17 1036)    Assessment: 26 yoM c/o CP with left PE. IV heparin for PE. To transition to apixaban today  Goal of Therapy:  Monitor CBC, renal function and for any bleeding Monitor platelets by anticoagulation protocol: Yes   Plan:  1) Discontinue IV heparin now 2) Start apixaban 10mg  BID x 1 week then transition to 5mg  BID thereafter 3) Monitor for bleeding 4) Monitor CBC and renal function 5) Will educate patient prior to discharge   Hessie Knows, PharmD, BCPS Pager [phone] 08/12/2017 10:39 AM

## 2017-08-12 NOTE — Progress Notes (Signed)
LE venous duplex prelim: negative for DVT. Kelleen Stolze Eunice, RDMS, RVT  

## 2017-08-12 NOTE — Progress Notes (Addendum)
Progress Note    Brandon [name]  ZOX:[id number] DOB: [birth year]  DOA: 08/11/2017 PCP: Devra Dopp, MD    Brief Narrative:   Chief complaint: Follow-up shortness of breath secondary to acute pulmonary embolism  Medical records reviewed and are as summarized below:  Brandon [name] is an 27 y.o. male with a PMH of multiple sclerosis, schizophrenia, hypertension, ADHD, recent hospitalization 07/30/17-08/04/17 for treatment of metabolic encephalopathy thought to be from trazodone use and ataxia who was admitted 08/11/17 for evaluation of chest pain associated with shortness of breath.  On initial evaluation in the ED, he was noted to be tachycardic with a normal oxygen saturation.  D-dimer was elevated at 3.4.  WBC elevated at 18.6.  Troponin, BNP, and EKG unremarkable.  CT of the chest did show a left pulmonary artery filling defect consistent with acute pulmonary embolism.  Assessment/Plan:   Principal Problem:   Pulmonary embolism (HCC) The patient was started on IV heparin once the diagnosis of pulmonary embolism was established.  Trigger appears to be immobility as he has been noted to be nonambulatory over the past 2 weeks.  No evidence of hemodynamic instability.  Lower extremity Dopplers negative. Transition to Eliquis. I personally reviewed the CT of the chest: fairly large left sided PE noted in the main pulmonary artery.  Active Problems:   Ataxia/abnormality of gait/multiple sclerosis PT evaluation requested.    HTN (hypertension) Norvasc and propranalol held on admission.  Will resume.    Auditory hallucination/Paranoid schizophrenia (HCC) Continue Clozaril and Cogentin.  Morbid obesity Body mass index is 41.3 kg/m.   Family Communication/Anticipated D/C date and plan/Code Status   DVT prophylaxis: Heparin ordered. Code Status: Full Code.  Family Communication: Mother updated. Disposition Plan: Home vs rehab depending on PT evaluation.    Medical  Consultants:    None.   Anti-Infectives:    None  Subjective:   Bernhardt reports that his shortness of breath is improving. Has some pleuritic type chest pain. Behavior is a bit inappropriate--ie laughing inappropriately.  Objective:    Vitals:   08/12/17 0300 08/12/17 0515 08/12/17 0625 08/12/17 0739  BP: (!) 137/98 (!) 173/113 (!) 155/98 (!) 126/93  Pulse: 97 97 (!) 112 99  Resp: 18 17 20    Temp:    (!) 97.5 F (36.4 C)  TempSrc:    Oral  SpO2: 97% 98% 96% 98%  Weight:      Height:       No intake or output data in the 24 hours ending 08/12/17 0815 Filed Weights   08/11/17 1645  Weight: (!) 142 kg (313 lb)    Exam: General: Obese male in no acute distress. Cardiovascular: Heart sounds show a regular rate, and rhythm. No gallops or rubs. No murmurs. No JVD. Lungs: Clear to auscultation bilaterally with good air movement. No rales, rhonchi or wheezes. Abdomen: Soft, nontender, nondistended with normal active bowel sounds. No masses. No hepatosplenomegaly. Neurological: Alert and oriented 3. Moves all extremities 4 with equal strength. Cranial nerves II through XII grossly intact. Skin: Warm and dry. No rashes or lesions. Extremities: No clubbing or cyanosis. No edema. Pedal pulses 2+. Psychiatric: Mood normal and affect a bit inappropriate. Insight and judgment are poor.   Data Reviewed:   I have personally reviewed following labs and imaging studies:  Labs: Labs show the following:   Basic Metabolic Panel: Recent Labs  Lab 08/11/17 1859  NA 134*  K 3.9  CL 100*  CO2 26  GLUCOSE 112*  BUN 19  CREATININE 1.05  CALCIUM 8.9   GFR Estimated Creatinine Clearance: 159.3 mL/min (by C-G formula based on SCr of 1.05 mg/dL). Liver Function Tests: Recent Labs  Lab 08/11/17 1859  AST 15  ALT 27  ALKPHOS 69  BILITOT 0.7  PROT 7.5  ALBUMIN 3.5   Coagulation profile Recent Labs  Lab 08/11/17 2253  INR 1.02    CBC: Recent Labs  Lab  08/11/17 1859  WBC 18.6*  HGB 13.5  HCT 40.6  MCV 76.7*  PLT 162   CBG: Recent Labs  Lab 08/05/17 1242  GLUCAP 289*   D-Dimer: Recent Labs    08/11/17 1859  DDIMER 3.40*    Procedures and diagnostic studies:  Dg Chest 2 View  Result Date: 08/11/2017 CLINICAL DATA:  Shortness of breath and chest pain. EXAM: CHEST  2 VIEW COMPARISON:  03/30/2017 and prior radiograph FINDINGS: This is a low volume film with bilateral mid-lower lung opacities. Cardiomediastinal silhouette is unremarkable. No pleural effusion or pneumothorax. No acute abnormalities identified. IMPRESSION: Low volume film with bilateral mid-lower lung opacities - favor atelectasis but airspace disease/pneumonia not excluded. Electronically Signed   By: Harmon Pier M.D.   On: 08/11/2017 18:09   Ct Angio Chest Pe W And/or Wo Contrast  Result Date: 08/11/2017 CLINICAL DATA:  Chest pain. EXAM: CT ANGIOGRAPHY CHEST WITH CONTRAST TECHNIQUE: Multidetector CT imaging of the chest was performed using the standard protocol during bolus administration of intravenous contrast. Multiplanar CT image reconstructions and MIPs were obtained to evaluate the vascular anatomy. CONTRAST:  ISOVUE-370 IOPAMIDOL (ISOVUE-370) INJECTION 76% COMPARISON:  Radiographs of same day. FINDINGS: Cardiovascular: Filling defects are noted in the upper lobe branches of the left pulmonary artery consistent with pulmonary embolus. Evaluation of the lower lobe branches is limited due to respiratory motion artifact. Normal cardiac size. No pericardial effusion is noted. Mediastinum/Nodes: No enlarged mediastinal, hilar, or axillary lymph nodes. Thyroid gland, trachea, and esophagus demonstrate no significant findings. Lungs/Pleura: No pneumothorax or pleural effusion is noted. Mild bibasilar subsegmental atelectasis is noted. Upper Abdomen: No acute abnormality. Musculoskeletal: No chest wall abnormality. No acute or significant osseous findings. Review of the  MIP images confirms the above findings. IMPRESSION: Filling defect is seen in upper lobe branches of left pulmonary artery consistent with pulmonary embolus. Critical Value/emergent results were called by telephone at the time of interpretation on 08/11/2017 at 9:52 pm to Dr. Pedro Earls [facility] , who verbally acknowledged these results. Mild bibasilar subsegmental atelectasis is noted. Electronically Signed   By: Lupita Raider, M.D.   On: 08/11/2017 21:53    Medications:   . cloZAPine  25 mg Oral BID  . sodium chloride       Continuous Infusions: . heparin 1,800 Units/hr (08/11/17 2305)     LOS: 0 days    Time in: 10:30 Time out: 11:00  I spent an additional 30 minutes coordinating the care of this patient with > 50% of the time in face to face contact, providing detailed information and teaching regarding how immobility has put him at risk for blood clots. He has some deficits related to his underlying psychiatric disease that pose challenging for teaching of his disease process. I also called the patient's mother and explained the plan of care to her.  Trula Ore Geofrey Silliman  [facility] Pager [phone]. If unable to reach me by pager, please call my cell phone at [phone].  *Please refer to [email or web address], password [email or web address] to get updated  schedule on who will round on this patient, as hospitalists switch teams weekly. If 7PM-7AM, please contact night-coverage at [email or web address], password [email or web address] for any overnight needs.  08/12/2017, 8:15 AM

## 2017-08-13 DIAGNOSIS — F17210 Nicotine dependence, cigarettes, uncomplicated: Secondary | ICD-10-CM

## 2017-08-13 DIAGNOSIS — Z818 Family history of other mental and behavioral disorders: Secondary | ICD-10-CM

## 2017-08-13 DIAGNOSIS — F1290 Cannabis use, unspecified, uncomplicated: Secondary | ICD-10-CM

## 2017-08-13 LAB — CBC
HCT: 39.5 % (ref 39.0–52.0)
Hemoglobin: 13 g/dL (ref 13.0–17.0)
MCH: 25.1 pg — ABNORMAL LOW (ref 26.0–34.0)
MCHC: 32.9 g/dL (ref 30.0–36.0)
MCV: 76.4 fL — ABNORMAL LOW (ref 78.0–100.0)
Platelets: 180 10*3/uL (ref 150–400)
RBC: 5.17 MIL/uL (ref 4.22–5.81)
RDW: 15 % (ref 11.5–15.5)
WBC: 14.2 10*3/uL — ABNORMAL HIGH (ref 4.0–10.5)

## 2017-08-13 MED ORDER — CLOZAPINE 25 MG PO TABS
25.0000 mg | ORAL_TABLET | Freq: Once | ORAL | Status: AC
  Administered 2017-08-13: 25 mg via ORAL
  Filled 2017-08-13: qty 1

## 2017-08-13 MED ORDER — ELIQUIS 5 MG VTE STARTER PACK: 10.0000 mg | ORAL_TABLET | Freq: Two times a day (BID) | ORAL | 0 refills | Status: DC

## 2017-08-13 MED ORDER — HALOPERIDOL LACTATE 5 MG/ML IJ SOLN
5.0000 mg | Freq: Once | INTRAMUSCULAR | Status: AC
  Administered 2017-08-13: 5 mg via INTRAVENOUS
  Filled 2017-08-13: qty 1

## 2017-08-13 MED ORDER — CLOZAPINE 50 MG PO TABS: 25.0000 mg | ORAL_TABLET | Freq: Two times a day (BID) | ORAL | 2 refills | Status: DC

## 2017-08-13 NOTE — Discharge Instructions (Signed)
Pulmonary Embolism A pulmonary embolism (PE) is a sudden blockage or decrease of blood flow in one lung or both lungs. Most blockages come from a blood clot that forms in a lower leg, thigh, or arm vein (deep vein thrombosis, DVT) and travels to the lungs. A clot is blood that has thickened into a gel or solid. PE is a dangerous and life-threatening condition that needs to be treated right away. What are the causes? This condition is usually caused by a blood clot that forms in a vein and moves to the lungs. In rare cases, it may be caused by air, fat, part of a tumor, or other tissue that moves through the veins and into the lungs. What increases the risk? The following factors may make you more likely to develop this condition:  Having DVT or a history of DVT.  Being older than age 60.  Personal or family history of blood clots or blood clotting disease.  Major or lengthy surgery.  Orthopedic surgery, especially hip or knee replacement.  Traumatic injury, such as breaking a hip or leg.  Spinal cord injury.  Stroke.  Taking medicines that contain estrogen. These include birth control pills and hormone replacement therapy.  Long-term (chronic) lung or heart disease.  Cancer and chemotherapy.  Having a central venous catheter.  Pregnancy and the period after delivery.  What are the signs or symptoms? Symptoms of this condition usually start suddenly and include:  Shortness of breath while active or at rest.  Coughing or coughing up blood or blood-tinged mucus.  Chest pain that is often worse with deep breaths.  Rapid or irregular heartbeat.  Feeling light-headed or dizzy.  Fainting.  Feeling anxious.  Sweating.  Pain and swelling in a leg. This is a symptom of DVT, which can lead to PE.  How is this diagnosed? This condition may be diagnosed based on:  Your medical history.  A physical exam.  Blood tests to check blood oxygen level and how well your blood  clots, and a D-dimer blood test, which checks your blood for a substance that is released when a blood clot breaks apart.  CT pulmonary angiogram. This test checks blood flow in and around your lungs.  Ventilation-perfusion scan, also called a lung VQ scan. This test measures air flow and blood flow to the lungs.  Ultrasound of the legs to look for blood clots.  How is this treated? Treatment for this conditions depends on many factors, such as the cause of your PE, your risk for bleeding or developing more clots, and other medical conditions you have. Treatment aims to remove, dissolve, or stop blood clots from forming or growing larger. Treatment may include:  Blood thinning medicines (anticoagulants) to stop clots from forming or growing. These medicines may be given as a pill, as an injection, or through an IV tube (infusion).  Medicines that dissolve clots (thrombolytics).  A procedure in which a flexible tube is used to remove a blood clot (embolectomy) or deliver medicine to destroy it (catheter-directed thrombolysis).  A procedure in which a filter is inserted into a large vein that carries blood to the heart (inferior vena cava). This filter (vena cava filter) catches blood clots before they reach the lungs.  Surgery to remove the clot (surgical embolectomy). This is rare.  You may need a combination of immediate, long-term (up to 3 months after diagnosis), and extended (more than 3 months after diagnosis) treatments. Your treatment may continue for several months (maintenance therapy).   You and your health care provider will work together to choose the treatment program that is best for you. Follow these instructions at home: If you are taking an anticoagulant medicine:  Take the medicine every day at the same time each day.  Understand what foods and drugs interact with your medicine.  Understand the side effects of this medicine, including excessive bruising or bleeding. Ask  your health care provider or pharmacist about other side effects. General instructions  Take over-the-counter and prescription medicines only as told by your health care provider.  Anticoagulant medicines may cause side effects, including easy bruising and difficulty stopping bleeding. If you are prescribed an anticoagulant: ? Hold pressure over cuts for longer than usual. ? Tell your dentist and other health care providers that you are taking anticoagulants before you have any procedure that may cause bleeding. ? Avoid contact sports. ? Be extra careful when handling sharp objects. ? Use a soft toothbrush. Floss with waxed dental floss. ? Shave with an electric razor.  Wear a medical alert bracelet or carry a medical alert card that says you have had a PE.  Ask your health care provider when you may return to your normal activities.  Talk with your health care provider about any travel plans. It is important to make sure that you are still able to take your medicine while on trips.  Keep all follow-up visits as told by your health care provider. This is important. How is this prevented? Take these actions to lower your risk of developing another PE:  Exercise regularly. Take frequent walks. For at least 30 minutes every day, engage in: ? Activity that involves moving your arms and legs. ? Activity that encourages good blood flow through your body by increasing your heart rate.  While traveling, drink plenty of water and avoid drinking alcohol. Ask your health care provider if you should wear below-the-knee compression stockings.  Avoid sitting or lying in bed for long periods of time without moving your legs. Exercise your arms and legs every hour during long-distance travel (over 4 hours).  If you are hospitalized or have surgery, ask your health care provider about your risks and what treatments can help prevent blood clots.  Maintain a healthy weight. Ask your health care  provider what weight is healthy for you.  If you are a woman who is over age 35, avoid unnecessary use of medicines that contain estrogen, including birth control pills.  Do not use any products that contain nicotine or tobacco, such as cigarettes and e-cigarettes. This is especially important if you take estrogen medicines. If you need help quitting, ask your health care provider.  See your health care provider for regular checkups. This may include blood tests and ultrasound testing on your legs to check for new blood clots.  Contact a health care provider if:  You missed a dose of your blood thinner medicine. Get help right away if:  You have new or increased pain, swelling, warmth, or redness in an arm or leg.  You have numbness or tingling in an arm or leg.  You have shortness of breath while active or at rest.  You have chest pain.  You have a rapid or irregular heartbeat.  You feel light-headed or dizzy.  You cough up blood.  You have blood in your vomit, stool, or urine.  You have a fever.  You have abdomen (abdominal) pain.  You have a severe fall or head injury.  You have a   severe headache.  You have vision changes.  You cannot move your arms or legs.  You are confused or have memory loss.  You are bleeding for 10 minutes or more, even with strong pressure on the wound. These symptoms may represent a serious problem that is an emergency. Do not wait to see if the symptoms will go away. Get medical help right away. Call your local emergency services (911 in the [place] Do not drive yourself to the hospital. Summary  A pulmonary embolism (PE) is a sudden blockage or decrease of blood flow in one lung or both lungs. PE is a dangerous and life-threatening condition that needs to be treated right away.  Having deep vein thrombosis (DVT) or a history of DVT is the most common risk factor for PE.  Treatments for this condition usually include medicines to thin  your blood (anticoagulants) or medicines to break apart blood clots (thrombolytics).  If you are prescribed blood thinners, it is important to take the medicine every single day at the same time each day.  If you have signs of PE or DVT, call your local emergency services (911 in the [place] This information is not intended to replace advice given to you by your health care provider. Make sure you discuss any questions you have with your health care provider. Document Released: 06/20/2000 Document Revised: 07/26/2016 Document Reviewed: 07/26/2016 [employer] Interactive Patient Education  2018 [employer]  

## 2017-08-13 NOTE — Consult Note (Signed)
[place] Psychiatry Consult   Reason for Consult:  Hallucinations Referring Physician:  Dr. Rockne Menghini Patient Identification: Brandon [name] MRN:  [id number] Principal Diagnosis: Paranoid schizophrenia [facility]) Diagnosis:   Patient Active Problem List   Diagnosis Date Noted  . Pulmonary embolism ([place]) [I26.99] 08/12/2017  . Hyperglycemia [R73.9] 07/31/2017  . Multiple sclerosis exacerbation ([place]) [G35] 07/30/2017  . Acute encephalopathy [G93.40] 07/30/2017  . Paranoid schizophrenia ([place]) [F20.0]   . Hallucinations [R44.3] 08/05/2014  . Auditory hallucination [R44.0]   . Left-sided weakness [R53.1] 01/18/2014  . Multiple sclerosis ([place]) [G35] 05/20/2013  . White matter abnormality on MRI of brain [R90.82] 11/23/2012  . Abnormality of gait [R26.9] 11/23/2012  . HTN (hypertension) [I10] 07/04/2011  . Ataxia [R27.0] 07/02/2011  . Obesity, Class III, BMI 40-49.9 (morbid obesity) ([place]) [E66.01] 01/14/2010  . ADHD [F90.9] 01/14/2010    Total Time spent with patient: 1 hour  Subjective:   Brandon [name] is a 27 y.o. male patient admitted with left PE.  HPI:   Per chart review, patient was admitted for treatment of left PE. He was treated with IV Heparin and was transitioned to Apixaban. He is prescribed Clozaril 25 mg BID and Cogentin 1 mg qhs. He also receives a monthly Invega injection (234 mg). Primary team reports hallucinations since admission and medical records indicates that he has talking to individuals not in the room. He was yelling yesterday and hit himself on the side of his head yesterday but did not sustain any injuries. He received Haldol 5 mg today for agitatio which appeared to have good effect.   On interview, Brandon [name] reports that he is hearing voices that tell him to do "something wrong." He reports that the voices tell him to hit himself in the head. They do not tell him to harm others. He also reports seeing shadows at night. He denies SI. He denies problems  with sleep or appetite. The patient's mother was at bedside. She reports that he was recently incarcerated and his Clozaril was not provided while in jail. He was restarted on Clozaril two weeks ago. His ACT team is titrating it back to his home dose of 150 mg daily. He last received his monthly Invega injection on 1/22. His mother reports that she feels like he is safe to return home today and she plans to have his ACT team follow up with him today. She appears to be very knowledgeable about his mental health.   Of note, he was last seen by the psychiatry consult service on 1/25. He reported hallucinations at this time. He reported that the voices told him to do "positive things." He was psychiatrically cleared and it was recommend to increase Clozaril from 50 mg daily to 75 mg daily.    Past Psychiatric History: ADHD  Risk to Self: Is patient at risk for suicide?: No Risk to Others:  None. Denies HI. Prior Inpatient Therapy:   He was hospitalized at [facility] 5 years ago for psychosis.  Prior Outpatient Therapy:   He is followed at [facility] and has a PSI team.   Past Medical History:  Past Medical History:  Diagnosis Date  . ADHD (attention deficit hyperactivity disorder)   . Bipolar 1 disorder ([place])   . Chronic back pain   . Chronic neck pain   . Hypertension   . Multiple sclerosis ([place]) 05/20/2013   left sided weakness, dysarthria  . Non-compliance   . Obesity   . Schizophrenia ([place])   . White  matter abnormality on MRI of brain 11/23/2012    Past Surgical History:  Procedure Laterality Date  . None     Family History:  Family History  Problem Relation Age of Onset  . Diabetes Mother   . ADD / ADHD Brother    Family Psychiatric  History: Father-schizoaffective disorder Social History:  Social History   Substance and Sexual Activity  Alcohol Use Yes  . Alcohol/week: 0.0 oz   Comment: "A little bit"      Social History   Substance and Sexual Activity  Drug Use Yes   . Types: Marijuana   Comment: Last used: unknown     Social History   Socioeconomic History  . Marital status: Single    Spouse name: None  . Number of children: 0  . Years of education: 11th  . Highest education level: None  Social Needs  . Financial resource strain: None  . Food insecurity - worry: None  . Food insecurity - inability: None  . Transportation needs - medical: None  . Transportation needs - non-medical: None  Occupational History    Employer: Chief of Staff    Comment: Disbaled  Tobacco Use  . Smoking status: Current Every Day Smoker    Packs/day: 0.25    Types: Cigarettes  . Smokeless tobacco: Never Used  . Tobacco comment: 4 cigarettes a day  Substance and Sexual Activity  . Alcohol use: Yes    Alcohol/week: 0.0 oz    Comment: "A little bit"   . Drug use: Yes    Types: Marijuana    Comment: Last used: unknown   . Sexual activity: None  Other Topics Concern  . None  Social History Narrative   Patient lives at home with his mother.   Disabled.   Education 11 th grade .   Right handed.   Caffeine - one cup daily soda.   Additional Social History: He lives at home with his mother. He denies illicit substance or alcohol  Use. He occasionally smokes cigarettes.     Allergies:  No Known Allergies  Labs:  Results for orders placed or performed during the hospital encounter of 08/11/17 (from the past 48 hour(s))  D-dimer, quantitative (not at [facility])     Status: Abnormal   Collection Time: 08/11/17  6:59 PM  Result Value Ref Range   D-Dimer, Quant 3.40 (H) 0.00 - 0.50 ug/mL-FEU    Comment: (NOTE) At the manufacturer cut-off of 0.50 ug/mL FEU, this assay has been documented to exclude PE with a sensitivity and negative predictive value of 97 to 99%.  At this time, this assay has not been approved by the FDA to exclude DVT/VTE. Results should be correlated with clinical presentation. Performed at [facility], [place] [address], [place], [place] [address]   CBC     Status: Abnormal   Collection Time: 08/11/17  6:59 PM  Result Value Ref Range   WBC 18.6 (H) 4.0 - 10.5 K/uL   RBC 5.29 4.22 - 5.81 MIL/uL   Hemoglobin 13.5 13.0 - 17.0 g/dL   HCT 40.6 39.0 - 52.0 %   MCV 76.7 (L) 78.0 - 100.0 fL   MCH 25.5 (L) 26.0 - 34.0 pg   MCHC 33.3 30.0 - 36.0 g/dL   RDW 15.0 11.5 - 15.5 %   Platelets 162 150 - 400 K/uL    Comment: Performed at [facility], [place] [address], [place], [place] [address]  Comprehensive metabolic panel  Status: Abnormal   Collection Time: 08/11/17  6:59 PM  Result Value Ref Range   Sodium 134 (L) 135 - 145 mmol/L   Potassium 3.9 3.5 - 5.1 mmol/L   Chloride 100 (L) 101 - 111 mmol/L   CO2 26 22 - 32 mmol/L   Glucose, Bld 112 (H) 65 - 99 mg/dL   BUN 19 6 - 20 mg/dL   Creatinine, Ser 1.05 0.61 - 1.24 mg/dL   Calcium 8.9 8.9 - 10.3 mg/dL   Total Protein 7.5 6.5 - 8.1 g/dL   Albumin 3.5 3.5 - 5.0 g/dL   AST 15 15 - 41 U/L   ALT 27 17 - 63 U/L   Alkaline Phosphatase 69 38 - 126 U/L   Total Bilirubin 0.7 0.3 - 1.2 mg/dL   GFR calc non Af Amer >60 >60 mL/min   GFR calc Af Amer >60 >60 mL/min    Comment: (NOTE) The eGFR has been calculated using the CKD EPI equation. This calculation has not been validated in all clinical situations. eGFR's persistently <60 mL/min signify possible Chronic Kidney Disease.    Anion gap 8 5 - 15    Comment: Performed at [facility], [place] [address], [place], [place] [address]  Brain natriuretic peptide     Status: None   Collection Time: 08/11/17  6:59 PM  Result Value Ref Range   B Natriuretic Peptide 13.7 0.0 - 100.0 pg/mL    Comment: Performed at [facility], [place] [address], [place], [place] [address]  I-Stat Troponin, ED (not at [facility])     Status: None   Collection Time: 08/11/17  7:00 PM  Result Value Ref Range   Troponin i, poc 0.00 0.00 - 0.08 ng/mL   Comment 3            Comment: Due to the  release kinetics of cTnI, a negative result within the first hours of the onset of symptoms does not rule out myocardial infarction with certainty. If myocardial infarction is still suspected, repeat the test at appropriate intervals.   APTT     Status: None   Collection Time: 08/11/17 10:53 PM  Result Value Ref Range   aPTT 30 24 - 36 seconds    Comment: Performed at [facility], [place] [address], [place], [place] [address]  Protime-INR     Status: None   Collection Time: 08/11/17 10:53 PM  Result Value Ref Range   Prothrombin Time 13.3 11.4 - 15.2 seconds   INR 1.02     Comment: Performed at [facility], [place] [address], [place], [place] [address]  Urinalysis, Routine w reflex microscopic     Status: Abnormal   Collection Time: 08/12/17  6:54 AM  Result Value Ref Range   Color, Urine YELLOW YELLOW   APPearance CLEAR CLEAR   Specific Gravity, Urine 1.035 (H) 1.005 - 1.030   pH 6.0 5.0 - 8.0   Glucose, UA NEGATIVE NEGATIVE mg/dL   Hgb urine dipstick NEGATIVE NEGATIVE   Bilirubin Urine NEGATIVE NEGATIVE   Ketones, ur NEGATIVE NEGATIVE mg/dL   Protein, ur NEGATIVE NEGATIVE mg/dL   Nitrite NEGATIVE NEGATIVE   Leukocytes, UA NEGATIVE NEGATIVE    Comment: Performed at [place] [address], [place],  [address]  CBC     Status: Abnormal   Collection Time: 08/12/17 12:29 PM  Result Value Ref Range   WBC 15.0 (H) 4.0 - 10.5 K/uL   RBC  5.16 4.22 - 5.81 MIL/uL   Hemoglobin 13.1 13.0 - 17.0 g/dL   HCT 39.2 39.0 - 52.0 %   MCV 76.0 (L) 78.0 - 100.0 fL   MCH 25.4 (L) 26.0 - 34.0 pg   MCHC 33.4 30.0 - 36.0 g/dL   RDW 15.0 11.5 - 15.5 %   Platelets 157 150 - 400 K/uL    Comment: Performed at [facility], [place] [address] [place], [place] [address]  Differential     Status: Abnormal   Collection Time: 08/12/17 12:29 PM  Result Value Ref Range   Neutrophils Relative % 72 %   Neutro Abs 10.8 (H)  1.7 - 7.7 K/uL   Lymphocytes Relative 17 %   Lymphs Abs 2.6 0.7 - 4.0 K/uL   Monocytes Relative 9 %   Monocytes Absolute 1.3 (H) 0.1 - 1.0 K/uL   Eosinophils Relative 2 %   Eosinophils Absolute 0.3 0.0 - 0.7 K/uL   Basophils Relative 0 %   Basophils Absolute 0.0 0.0 - 0.1 K/uL    Comment: Performed at [facility], [place] [address] [place], [place] [address]  CBC     Status: Abnormal   Collection Time: 08/13/17  6:30 AM  Result Value Ref Range   WBC 14.2 (H) 4.0 - 10.5 K/uL   RBC 5.17 4.22 - 5.81 MIL/uL   Hemoglobin 13.0 13.0 - 17.0 g/dL   HCT 39.5 39.0 - 52.0 %   MCV 76.4 (L) 78.0 - 100.0 fL   MCH 25.1 (L) 26.0 - 34.0 pg   MCHC 32.9 30.0 - 36.0 g/dL   RDW 15.0 11.5 - 15.5 %   Platelets 180 150 - 400 K/uL    Comment: Performed at [facility], [place] [address] [place], [place] [address]    Current Facility-Administered Medications  Medication Dose Route Frequency Provider Last Rate Last Dose  . amLODipine (NORVASC) tablet 10 mg  10 mg Oral Daily Rama, Venetia Maxon, MD   10 mg at 08/13/17 0931  . apixaban (ELIQUIS) tablet 10 mg  10 mg Oral BID Adrian Saran, RPH   10 mg at 08/13/17 9562   Followed by  . [START ON 08/19/2017] apixaban (ELIQUIS) tablet 5 mg  5 mg Oral BID Arlyn Dunning M, RPH      . benztropine (COGENTIN) tablet 1 mg  1 mg Oral Daily Rama, Venetia Maxon, MD   1 mg at 08/13/17 0931  . cloZAPine (CLOZARIL) tablet 25 mg  25 mg Oral BID Emokpae, Ejiroghene E, MD   25 mg at 08/13/17 0931  . Influenza vac split quadrivalent PF (FLUARIX) injection 0.5 mL  0.5 mL Intramuscular Prior to discharge Rama, Venetia Maxon, MD      . polyethylene glycol (MIRALAX / GLYCOLAX) packet 17 g  17 g Oral Daily Schorr, Rhetta Mura, NP   17 g at 08/13/17 0931  . propranolol (INDERAL) tablet 10 mg  10 mg Oral BID Rama, Venetia Maxon, MD   10 mg at 08/13/17 0931  . traZODone (DESYREL) tablet 200 mg  200 mg Oral QHS PRN Emokpae, Ejiroghene E, MD   200 mg at 08/12/17  2106    Musculoskeletal: Strength & Muscle Tone: within normal limits Gait & Station: UTA since patient was sitting in bed. Patient leans: N/A  Psychiatric Specialty Exam: Physical Exam  Nursing note and vitals reviewed. Constitutional: He is oriented to person, place, and time. He appears well-developed and well-nourished.  HENT:  Head: Normocephalic and  atraumatic.  Neck: Normal range of motion.  Musculoskeletal: Normal range of motion.  Neurological: He is alert and oriented to person, place, and time.  Skin: No rash noted.  Psychiatric: He has a normal mood and affect. His speech is normal. Judgment and thought content normal. He is actively hallucinating. Cognition and memory are normal.    Review of Systems  Cardiovascular: Negative for chest pain.  Gastrointestinal: Positive for constipation. Negative for abdominal pain, diarrhea, nausea and vomiting.  Psychiatric/Behavioral: Positive for hallucinations. Negative for depression, substance abuse and suicidal ideas. The patient is not nervous/anxious and does not have insomnia.     Blood pressure (!) 154/87, pulse 95, temperature 98.3 F (36.8 C), temperature source Oral, resp. rate 18, height 6' 1"  (1.854 m), weight (!) 142 kg (313 lb), SpO2 96 %.Body mass index is 41.3 kg/m.  General Appearance: Fairly Groomed, young, obese, African American male, wearing paper hospital scrubs with corrective lenses and sitting up in bed prior to eating lunch. NAD.   Eye Contact:  Good  Speech:  Clear and Coherent and Normal Rate  Volume:  Normal  Mood:  Euthymic  Affect:  Appropriate and Congruent  Thought Process:  Goal Directed and Linear  Orientation:  Full (Time, Place, and Person)  Thought Content:  Logical  Suicidal Thoughts:  No  Homicidal Thoughts:  No  Memory:  Immediate;   Fair Recent;   Fair Remote;   Fair  Judgement:  Fair  Insight:  Fair  Psychomotor Activity:  Normal  Concentration:  Concentration: Fair and Attention  Span: Fair. He was distracted once due to responding to internal stimuli (talking to himself).   Recall:  Smiley Houseman of Knowledge:  Fair  Language:  Fair  Akathisia:  No  Handed:  Right  AIMS (if indicated):   N/A  Assets:  Communication Skills Housing Social Support  ADL's:  Intact  Cognition:  WNL  Sleep:   Okay   Assessment:  Brandon [name] is a 27 y.o. male who was admitted with left PE. Psychiatry was consulted for psychosis. Patient reports CAH that tell him to hit himself in the head. He denies SI, HI or CAH to harm others. It appears that his Clozaril was not continued while he was incarcerated so his ACT team is currently titrating the dose. It was restarted 2 weeks ago. Mother and patient are agreeable to increasing the dose further today. Mother reports that she feels he is safe for discharge today and the ACT team will follow up with him following discharge from the hospital.   Treatment Plan Summary: -Increase Clozaril 25 mg BID to 50 mg BID for psychosis.  -Continue Cogentin 1 mg qhs and monthly Invega injection 234 mg (last received on 1/22).  -Patient will follow up with his ACT team upon discharge. -Patient is psychiatrically cleared. Psychiatry will sign off patient at this time. Please consult psychiatry again as needed.   Disposition: No evidence of imminent risk to self or others at present.   Patient does not meet criteria for psychiatric inpatient admission.  Faythe Dingwall, DO 08/13/2017 12:15 PM

## 2017-08-13 NOTE — Discharge Summary (Addendum)
Physician Discharge Summary  Brandon [name] ZOX:[id number] DOB: [birth year] DOA: 08/11/2017  PCP: Devra Dopp, MD  Admit date: 08/11/2017 Discharge date: 08/13/2017  Admitted From: Home Discharge disposition: Home   Recommendations for Outpatient Follow-Up:   1. Recommend outpatient F/U with PCP in 2 weeks.  2. Clozaril increased to 50 mg BID s/p psychiatric evaluation per recommendations.   Discharge Diagnosis:   Principal Problem:   Pulmonary embolism (HCC) Active Problems:   Obesity, Class III, BMI 40-49.9 (morbid obesity) (HCC)   Ataxia   HTN (hypertension)   Abnormality of gait   Multiple sclerosis (HCC)   Auditory hallucination   Paranoid schizophrenia (HCC)  Discharge Condition: Improved.  Diet recommendation: Regular.   History of Present Illness:   Brandon [name] is an 27 y.o. male with a PMH of multiple sclerosis, schizophrenia, hypertension, ADHD, recent hospitalization 07/30/17-08/04/17 for treatment of metabolic encephalopathy thought to be from trazodone use and ataxia who was admitted 08/11/17 for evaluation of chest pain associated with shortness of breath.  On initial evaluation in the ED, he was noted to be tachycardic with a normal oxygen saturation.  D-dimer was elevated at 3.4.  WBC elevated at 18.6.  Troponin, BNP, and EKG unremarkable.  CT of the chest did show a left pulmonary artery filling defect consistent with acute pulmonary embolism.  Hospital Course by Problem:   Principal Problem:   Pulmonary embolism (HCC) The patient was started on IV heparin once the diagnosis of pulmonary embolism was established.  Trigger appears to be immobility as he has been noted to be nonambulatory over the past 2 weeks.  No evidence of hemodynamic instability.  Lower extremity Dopplers negative. Transitioned to Eliquis 08/12/17. Encouraged to remain as mobile as possible.  Active Problems:   Ataxia/abnormality of gait/multiple sclerosis PT evaluation requested.  May need HH PT once evaluation completed (will be set up if indicated). Continue MS medications.    HTN (hypertension) Norvasc and propranalol held on admission.  Will resume.    Auditory hallucination/Paranoid schizophrenia [facility]) Evaluated by psychiatrist after the patient developed hallucinations and agitation. Recommended increasing Clozaril to 50 mg BID. Recommend close outpatient F/U with psychiatrist.  Morbid obesity Body mass index is 41.3 kg/m.   Medical Consultants:    Psychiatry   Discharge Exam:   Vitals:   08/12/17 2115 08/13/17 0551  BP: (!) 152/98 (!) 154/87  Pulse: (!) 108 95  Resp: 18 18  Temp: 97.8 F (36.6 C) 98.3 F (36.8 C)  SpO2: 100% 96%   Vitals:   08/12/17 0625 08/12/17 0739 08/12/17 2115 08/13/17 0551  BP: (!) 155/98 (!) 126/93 (!) 152/98 (!) 154/87  Pulse: (!) 112 99 (!) 108 95  Resp: 20  18 18   Temp:  (!) 97.5 F (36.4 C) 97.8 F (36.6 C) 98.3 F (36.8 C)  TempSrc:  Oral Oral Oral  SpO2: 96% 98% 100% 96%  Weight:      Height:        General exam: Appears calm and comfortable. Sitting up in chair. Respiratory system: Clear to auscultation. Respiratory effort normal. Cardiovascular system: S1 & S2 heard, RRR. No JVD,  rubs, gallops or clicks. No murmurs. Gastrointestinal system: Abdomen is nondistended, soft and nontender. No organomegaly or masses felt. Normal bowel sounds heard. Central nervous system: Alert and oriented. No focal neurological deficits. Extremities: No clubbing,  or cyanosis. No edema. Skin: No rashes, lesions or ulcers. Psychiatry: Judgement and insight appear impaired. Mood & affect blunted.   The results  of significant diagnostics from this hospitalization (including imaging, microbiology, ancillary and laboratory) are listed below for reference.     Procedures and Diagnostic Studies:   Dg Chest 2 View  Result Date: 08/11/2017 CLINICAL DATA:  Shortness of breath and chest pain. EXAM: CHEST  2 VIEW  COMPARISON:  03/30/2017 and prior radiograph FINDINGS: This is a low volume film with bilateral mid-lower lung opacities. Cardiomediastinal silhouette is unremarkable. No pleural effusion or pneumothorax. No acute abnormalities identified. IMPRESSION: Low volume film with bilateral mid-lower lung opacities - favor atelectasis but airspace disease/pneumonia not excluded. Electronically Signed   By: Harmon Pier M.D.   On: 08/11/2017 18:09   Ct Angio Chest Pe W And/or Wo Contrast  Result Date: 08/11/2017 CLINICAL DATA:  Chest pain. EXAM: CT ANGIOGRAPHY CHEST WITH CONTRAST TECHNIQUE: Multidetector CT imaging of the chest was performed using the standard protocol during bolus administration of intravenous contrast. Multiplanar CT image reconstructions and MIPs were obtained to evaluate the vascular anatomy. CONTRAST:  ISOVUE-370 IOPAMIDOL (ISOVUE-370) INJECTION 76% COMPARISON:  Radiographs of same day. FINDINGS: Cardiovascular: Filling defects are noted in the upper lobe branches of the left pulmonary artery consistent with pulmonary embolus. Evaluation of the lower lobe branches is limited due to respiratory motion artifact. Normal cardiac size. No pericardial effusion is noted. Mediastinum/Nodes: No enlarged mediastinal, hilar, or axillary lymph nodes. Thyroid gland, trachea, and esophagus demonstrate no significant findings. Lungs/Pleura: No pneumothorax or pleural effusion is noted. Mild bibasilar subsegmental atelectasis is noted. Upper Abdomen: No acute abnormality. Musculoskeletal: No chest wall abnormality. No acute or significant osseous findings. Review of the MIP images confirms the above findings. IMPRESSION: Filling defect is seen in upper lobe branches of left pulmonary artery consistent with pulmonary embolus. Critical Value/emergent results were called by telephone at the time of interpretation on 08/11/2017 at 9:52 pm to Dr. Pedro Earls [facility] , who verbally acknowledged these results. Mild bibasilar  subsegmental atelectasis is noted. Electronically Signed   By: Lupita Raider, M.D.   On: 08/11/2017 21:53   Vas [place] Lower Extremity Venous (dvt)  Result Date: 08/12/2017  Lower Venous Study Risk Factors: Confirmed PE. Examination Guidelines: A complete evaluation includes B-mode imaging, spectral doppler, color doppler, and power doppler as needed of all accessible portions of each vessel. Bilateral testing is considered an integral part of a complete examination. Limited examinations for reoccurring indications may be performed as noted. The reflux portion of the exam is performed with the patient in reverse Trendelenburg.  Right Venous Findings: +---------+---------------+---------+-----------+----------+-------+          CompressibilityPhasicitySpontaneityPropertiesSummary +---------+---------------+---------+-----------+----------+-------+ CFV      Full           Yes      Yes                          +---------+---------------+---------+-----------+----------+-------+ FV Prox  Full                                                 +---------+---------------+---------+-----------+----------+-------+ FV Mid   Full                                                 +---------+---------------+---------+-----------+----------+-------+ FV  DistalFull                                                 +---------+---------------+---------+-----------+----------+-------+ PFV      Full                                                 +---------+---------------+---------+-----------+----------+-------+ POP      Full           Yes      Yes                          +---------+---------------+---------+-----------+----------+-------+ PTV      Full                                                 +---------+---------------+---------+-----------+----------+-------+ PERO     Full                                                  +---------+---------------+---------+-----------+----------+-------+  Left Venous Findings: +---------+---------------+---------+-----------+----------+-------+          CompressibilityPhasicitySpontaneityPropertiesSummary +---------+---------------+---------+-----------+----------+-------+ CFV      Full           Yes      Yes                          +---------+---------------+---------+-----------+----------+-------+ FV Prox  Full                                                 +---------+---------------+---------+-----------+----------+-------+ FV Mid   Full                                                 +---------+---------------+---------+-----------+----------+-------+ FV DistalFull                                                 +---------+---------------+---------+-----------+----------+-------+ PFV      Full                                                 +---------+---------------+---------+-----------+----------+-------+ POP      Full           Yes      Yes                          +---------+---------------+---------+-----------+----------+-------+ PTV  Full                                                 +---------+---------------+---------+-----------+----------+-------+ PERO     Full                                                 +---------+---------------+---------+-----------+----------+-------+    Final Interpretation: Right: There is no evidence of deep vein thrombosis in the lower extremity. No cystic structure found in the popliteal fossa. Left: There is no evidence of deep vein thrombosis in the lower extremity. No cystic structure found in the popliteal fossa.  *See table(s) above for measurements and observations. Electronically signed by Fabienne Bruns on 08/12/2017 at 4:11:46 PM.   Final     Labs:   Basic Metabolic Panel: Recent Labs  Lab 08/11/17 1859  NA 134*  K 3.9  CL 100*  CO2 26  GLUCOSE 112*  BUN 19    CREATININE 1.05  CALCIUM 8.9   GFR Estimated Creatinine Clearance: 159.3 mL/min (by C-G formula based on SCr of 1.05 mg/dL). Liver Function Tests: Recent Labs  Lab 08/11/17 1859  AST 15  ALT 27  ALKPHOS 69  BILITOT 0.7  PROT 7.5  ALBUMIN 3.5   Coagulation profile Recent Labs  Lab 08/11/17 2253  INR 1.02    CBC: Recent Labs  Lab 08/11/17 1859 08/12/17 1229 08/13/17 0630  WBC 18.6* 15.0* 14.2*  NEUTROABS  --  10.8*  --   HGB 13.5 13.1 13.0  HCT 40.6 39.2 39.5  MCV 76.7* 76.0* 76.4*  PLT 162 157 180   D-Dimer Recent Labs    08/11/17 1859  DDIMER 3.40*    Discharge Instructions:   Discharge Instructions    Call MD for:  difficulty breathing, headache or visual disturbances   Complete by:  As directed    Call MD for:  extreme fatigue   Complete by:  As directed    Call MD for:  persistant dizziness or light-headedness   Complete by:  As directed    Diet general   Complete by:  As directed    Discharge instructions   Complete by:  As directed    Take Eliquis 10 mg twice a day for one week then decrease to 5 mg twice a day thereafter.   Increase activity slowly   Complete by:  As directed      Allergies as of 08/13/2017   No Known Allergies     Medication List    STOP taking these medications   predniSONE 10 MG tablet Commonly known as:  DELTASONE     TAKE these medications   amLODipine 10 MG tablet Commonly known as:  NORVASC Take 10 mg by mouth daily.   benztropine 1 MG tablet Commonly known as:  COGENTIN Take 1 tablet (1 mg total) by mouth 2 (two) times daily. What changed:  when to take this   cloZAPine 25 MG tablet Commonly known as:  CLOZARIL Take 25 mg by mouth 2 (two) times daily.   ELIQUIS STARTER PACK 5 MG Tabs Take 10 mg by mouth 2 (two) times daily.   INVEGA SUSTENNA 234 MG/1.5ML Susp injection Generic drug:  paliperidone Inject 234 mg into the  muscle every 30 (thirty) days.   Melatonin 5 MG Tabs Take 10 mg by mouth at  bedtime.   propranolol 10 MG tablet Commonly known as:  INDERAL Take 1 tablet (10 mg total) by mouth 2 (two) times daily.   traZODone 100 MG tablet Commonly known as:  DESYREL Take 200 mg by mouth at bedtime as needed for sleep.      Follow-up Information    Devra Dopp, MD. Schedule an appointment as soon as possible for a visit in 2 week(s).   Specialty:  Family Medicine Why:  Hospital follow up of pulmonary embolus. Contact information: [address] [place] [address] [phone]            Time coordinating discharge: 35 min. Mother updated by telephone.  SignedTrula Ore Sylvanna Burggraf  Pager [phone] [facility] 08/13/2017, 9:29 AM

## 2017-08-13 NOTE — Evaluation (Signed)
Physical Therapy Evaluation Patient Details Name: Brandon [name] MRN: [id number] DOB: September 18, 1990 Today's Date: 08/13/2017   History of Present Illness  27 yo male admitted with PE. Hx of MS, schizophrenia, bipolar d/o, obesity, non compliance  Clinical Impression  On eval, pt was Min guard assist for mobility. He walked ~150 feet with a RW. No c/o dizziness or dyspnea with activity. Recommend HHPT f/u at discharge. Discussed d/c plan-pt will return home with family assisting as needed. Pt's mother arrived towards end of session-she confirmed plan for home and for pt to resume HHPT.     Follow Up Recommendations Home health PT;Supervision/Assistance - 24 hour    Equipment Recommendations  None recommended by PT    Recommendations for Other Services       Precautions / Restrictions Precautions Precautions: Fall Restrictions Weight Bearing Restrictions: No      Mobility  Bed Mobility Overal bed mobility: Needs Assistance Bed Mobility: Supine to Sit     Supine to sit: Supervision     General bed mobility comments: for safety  Transfers Overall transfer level: Needs assistance Equipment used: Rolling walker (2 wheeled) Transfers: Sit to/from [employer] Sit to Stand: Min guard Stand pivot transfers: Min guard       General transfer comment: close guard for safety. stand pivot, bed to recliner.   Ambulation/Gait Ambulation/Gait assistance: Min guard Ambulation Distance (Feet): 150 Feet Assistive device: Rolling walker (2 wheeled) Gait Pattern/deviations: Step-through pattern     General Gait Details: close guard for safety. cues for safety.   Stairs            Wheelchair Mobility    Modified Rankin (Stroke Patients Only)       Balance             Standing balance-Leahy Scale: Fair                               Pertinent Vitals/Pain Pain Assessment: No/denies pain    Home Living Family/patient expects to be  discharged to:: Private residence Living Arrangements: Parent Available Help at Discharge: Family Type of Home: House Home Access: Ramped entrance     Home Layout: One level Home Equipment: Environmental consultant - 4 wheels      Prior Function     Gait / Transfers Assistance Needed: no device on "good days"; Rollator or cane on "bad days"; he tells me bad days are rare, and he usually doesn't need an assistive device  ADL's / Homemaking Assistance Needed: tells me a family member cooks, not him        Higher education careers adviser        Extremity/Trunk Assessment   Upper Extremity Assessment Upper Extremity Assessment: Overall WFL for tasks assessed    Lower Extremity Assessment Lower Extremity Assessment: Generalized weakness    Cervical / Trunk Assessment Cervical / Trunk Assessment: Normal  Communication   Communication: Expressive difficulties  Cognition Arousal/Alertness: Awake/alert Behavior During Therapy: WFL for tasks assessed/performed Overall Cognitive Status: History of cognitive impairments - at baseline                                        General Comments      Exercises     Assessment/Plan    PT Assessment Patient needs continued PT services  PT Problem List Decreased balance;Decreased mobility;Decreased strength;Decreased  cognition       PT Treatment Interventions DME instruction;Gait training;Functional mobility training;Therapeutic activities;Balance training;Patient/family education;Therapeutic exercise    PT Goals (Current goals can be found in the Care Plan section)  Acute Rehab PT Goals Patient Stated Goal: to go home PT Goal Formulation: With patient/family Time For Goal Achievement: 08/27/17 Potential to Achieve Goals: Good    Frequency Min 3X/week   Barriers to discharge        Co-evaluation               AM-PAC PT "6 Clicks" Daily Activity  Outcome Measure Difficulty turning over in bed (including adjusting bedclothes,  sheets and blankets)?: None Difficulty moving from lying on back to sitting on the side of the bed? : None Difficulty sitting down on and standing up from a chair with arms (e.g., wheelchair, bedside commode, etc,.)?: A Little Help needed moving to and from a bed to chair (including a wheelchair)?: A Little Help needed walking in hospital room?: A Little Help needed climbing 3-5 steps with a railing? : A Little 6 Click Score: 20    End of Session Equipment Utilized During Treatment: Gait belt Activity Tolerance: Patient tolerated treatment well Patient left: in chair;with call bell/phone within reach;with chair alarm set;with family/visitor present   PT Visit Diagnosis: Muscle weakness (generalized) (M62.81);Difficulty in walking, not elsewhere classified (R26.2)    Time: 1001-1020 PT Time Calculation (min) (ACUTE ONLY): 19 min   Charges:   PT Evaluation $PT Eval Moderate Complexity: 1 Mod     PT G Codes:         Rebeca Alert, MPT Pager: [phone]

## 2017-08-26 NOTE — Telephone Encounter (Signed)
Error

## 2017-09-01 ENCOUNTER — Telehealth: Payer: Self-pay | Admitting: *Deleted

## 2017-09-01 NOTE — Telephone Encounter (Signed)
LVM requesting a call back. Need patient's updated insurance information.  Pt cannot have infusion at our office d/t having both Medicare and Medicaid. We will have to get approval of ocrevus through Medicare part D and then get him scheduled at the hospital for infusions.  We need his prescriptions ID#, (rxBIN, PCN, and group if there is one) and phone number on card.

## 2017-09-02 ENCOUNTER — Other Ambulatory Visit: Payer: Self-pay | Admitting: *Deleted

## 2017-09-02 MED ORDER — SODIUM CHLORIDE 0.9 % IV SOLN: INTRAVENOUS | 1 refills | Status: DC

## 2017-09-02 NOTE — Telephone Encounter (Signed)
[name] called back. She does not have patient's insurance cards but she does have information written down somewhere. She will call back when she finds insurance information.

## 2017-09-02 NOTE — Telephone Encounter (Signed)
Initiated PA Ocrevus on covermymeds. Key: [id number] - PA Case ID: [id number]. In process of completing.

## 2017-09-02 NOTE — Telephone Encounter (Signed)
FYI Pt mother has called back to provide the requested information from RN Suan Halter Script Member [place]:[id number] Group#[id number] RX BIN# [id number] MHD:[id number]

## 2017-09-02 NOTE — Telephone Encounter (Signed)
Submitted PA. Waiting on determination.   "Your information has been submitted to [employer]. [employer] will review the request and will issue a decision, typically within 1-3 days from your submission. If [employer] has not responded in 1-3 days or if you have any questions about your ePA request, please contact [employer] at [phone]." 

## 2017-09-02 NOTE — Telephone Encounter (Signed)
Received fax notification from [employer] that PA approved effective 07/07/2017-09/02/2017 as non-formulary exception through his medicare part D plan.  Faxed notice of approval to [employer] at [phone]. Received fax confirmation.  Faxed start form to [facility] with approval letter at [phone]. Received fax confirmation.   Faxed start form, rx, insurance information, approval letter to [employer] short stay at [phone]. Received fax confirmation. Asked them to call patient to get him scheduled.

## 2017-09-03 ENCOUNTER — Telehealth: Payer: Self-pay | Admitting: *Deleted

## 2017-09-03 DIAGNOSIS — G35 Multiple sclerosis: Secondary | ICD-10-CM

## 2017-09-03 NOTE — Telephone Encounter (Signed)
Pts mother called requesting a call back from RN to know if shes heard anything about fixing the infusion schedule

## 2017-09-03 NOTE — Telephone Encounter (Signed)
Called back. Advised we are waiting to hear back from [facility] short stay. Might not be until next week to get him r/s. I will call her with any updates. She verbalized understanding.

## 2017-09-03 NOTE — Telephone Encounter (Addendum)
I received a staff message from [place] asking our office to call pt to notify him of appt made by their office which included the following "Day 1- 09/22/17 at 0700, Day 15- 10/06/17 at 0800, 48-month appt- 04/09/18 at 0800".   Called mother to advise of appt times for pt at [facility] short stay that "[facility] Laine, NT scheduled for pt. Phone for short stay: [phone].  Mother verbalized dissatisfaction stating "Geraldine Contras knows I cannot take him on a Tuesday or Thursday. I have told her this. She also knows I cannot bring him that early. He takes medication for mood disorder. I have to bring him in around 11:30am-12:00pm". I have already gone over all of this with her".  I advised I will have to speak with them to see what we can do about his appt times. We will call back to advise. She verbalized understanding.

## 2017-09-08 MED FILL — OCREVUS 300 MG/10ML SOLN: 300 | 15 days supply | Qty: 20 | Fill #0

## 2017-09-22 ENCOUNTER — Encounter (HOSPITAL_COMMUNITY): Payer: Medicare Other

## 2017-09-22 ENCOUNTER — Emergency Department (HOSPITAL_COMMUNITY): Payer: Medicare Other

## 2017-09-22 ENCOUNTER — Emergency Department (HOSPITAL_COMMUNITY)
Admission: EM | Admit: 2017-09-22 | Discharge: 2017-09-22 | Disposition: A | Discharge status: Home / Self Care | Payer: Medicare Other | Attending: Emergency Medicine | Admitting: Emergency Medicine

## 2017-09-22 DIAGNOSIS — Z7901 Long term (current) use of anticoagulants: Secondary | ICD-10-CM | POA: Diagnosis not present

## 2017-09-22 DIAGNOSIS — W1830XA Fall on same level, unspecified, initial encounter: Secondary | ICD-10-CM | POA: Diagnosis not present

## 2017-09-22 DIAGNOSIS — Z86711 Personal history of pulmonary embolism: Secondary | ICD-10-CM | POA: Insufficient documentation

## 2017-09-22 DIAGNOSIS — Z79899 Other long term (current) drug therapy: Secondary | ICD-10-CM | POA: Diagnosis not present

## 2017-09-22 DIAGNOSIS — Z043 Encounter for examination and observation following other accident: Secondary | ICD-10-CM | POA: Insufficient documentation

## 2017-09-22 DIAGNOSIS — F17210 Nicotine dependence, cigarettes, uncomplicated: Secondary | ICD-10-CM | POA: Diagnosis not present

## 2017-09-22 DIAGNOSIS — F250 Schizoaffective disorder, bipolar type: Secondary | ICD-10-CM | POA: Insufficient documentation

## 2017-09-22 DIAGNOSIS — I10 Essential (primary) hypertension: Secondary | ICD-10-CM | POA: Diagnosis not present

## 2017-09-22 DIAGNOSIS — R52 Pain, unspecified: Secondary | ICD-10-CM

## 2017-09-22 DIAGNOSIS — G35 Multiple sclerosis: Secondary | ICD-10-CM | POA: Diagnosis not present

## 2017-09-22 LAB — URINALYSIS, ROUTINE W REFLEX MICROSCOPIC
Bacteria, UA: NONE SEEN
Bilirubin Urine: NEGATIVE
Glucose, UA: NEGATIVE mg/dL
Ketones, ur: NEGATIVE mg/dL
Leukocytes, UA: NEGATIVE
Nitrite: NEGATIVE
Protein, ur: NEGATIVE mg/dL
Specific Gravity, Urine: 1.025 (ref 1.005–1.030)
Squamous Epithelial / LPF: NONE SEEN
pH: 5 (ref 5.0–8.0)

## 2017-09-22 LAB — COMPREHENSIVE METABOLIC PANEL
ALT: 29 U/L (ref 17–63)
AST: 15 U/L (ref 15–41)
Albumin: 3.9 g/dL (ref 3.5–5.0)
Alkaline Phosphatase: 80 U/L (ref 38–126)
Anion gap: 10 (ref 5–15)
BUN: 15 mg/dL (ref 6–20)
CO2: 27 mmol/L (ref 22–32)
Calcium: 9.5 mg/dL (ref 8.9–10.3)
Chloride: 105 mmol/L (ref 101–111)
Creatinine, Ser: 0.98 mg/dL (ref 0.61–1.24)
GFR calc Af Amer: >60 mL/min (ref >60)
GFR calc non Af Amer: >60 mL/min (ref >60)
Glucose, Bld: 100 mg/dL — ABNORMAL HIGH (ref 65–99)
Potassium: 3.9 mmol/L (ref 3.5–5.1)
Sodium: 142 mmol/L (ref 135–145)
Total Bilirubin: 0.3 mg/dL (ref 0.3–1.2)
Total Protein: 7.4 g/dL (ref 6.5–8.1)

## 2017-09-22 LAB — CBC WITH DIFFERENTIAL/PLATELET
Basophils Absolute: 0 10*3/uL (ref 0.0–0.1)
Basophils Relative: 1 %
Eosinophils Absolute: 0.4 10*3/uL (ref 0.0–0.7)
Eosinophils Relative: 6 %
HCT: 41.2 % (ref 39.0–52.0)
Hemoglobin: 12.9 g/dL — ABNORMAL LOW (ref 13.0–17.0)
Lymphocytes Relative: 29 %
Lymphs Abs: 1.8 10*3/uL (ref 0.7–4.0)
MCH: 25.1 pg — ABNORMAL LOW (ref 26.0–34.0)
MCHC: 31.3 g/dL (ref 30.0–36.0)
MCV: 80.3 fL (ref 78.0–100.0)
Monocytes Absolute: 0.7 10*3/uL (ref 0.1–1.0)
Monocytes Relative: 11 %
Neutro Abs: 3.4 10*3/uL (ref 1.7–7.7)
Neutrophils Relative %: 53 %
Platelets: 187 10*3/uL (ref 150–400)
RBC: 5.13 MIL/uL (ref 4.22–5.81)
RDW: 15 % (ref 11.5–15.5)
WBC: 6.2 10*3/uL (ref 4.0–10.5)

## 2017-09-22 LAB — CBG MONITORING, ED: Glucose-Capillary: 93 mg/dL (ref 65–99)

## 2017-09-22 NOTE — ED Notes (Signed)
[facility] has been contacted regarding pt transport.

## 2017-09-22 NOTE — ED Provider Notes (Signed)
[facility]-EMERGENCY DEPT Provider Note   CSN: [id number] Arrival date & time: 09/22/17  0732     History   Chief Complaint Chief Complaint  Patient presents with  . Fall    HPI Brandon [name] is a 27 y.o. male.  27 year old male presents after fall at home.  Has a history of multiple sclerosis and was being transferred from a chair when he fell down and struck his head.  No loss of consciousness.  Does have a recent history of pulmonary embolism and takes Eliquis.  Patient is somewhat sleepy at this time and states is from his medications as well as not getting much rest last night.  Denies any visual changes or headache or vomiting.  Has some scapular pain.  Denies any weakness in his arms or legs.  No recent fever or chills.      Past Medical History:  Diagnosis Date  . ADHD (attention deficit hyperactivity disorder)   . Bipolar 1 disorder (HCC)   . Chronic back pain   . Chronic neck pain   . Hypertension   . Multiple sclerosis (HCC) 05/20/2013   left sided weakness, dysarthria  . Non-compliance   . Obesity   . Schizophrenia (HCC)   . 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 Pulmonary embolism (HCC) 08/12/2017  . Hyperglycemia 07/31/2017  . Multiple sclerosis exacerbation (HCC) 07/30/2017  . Acute encephalopathy 07/30/2017  . Paranoid schizophrenia (HCC)   . Hallucinations 08/05/2014  . Auditory hallucination   . Left-sided weakness 01/18/2014  . Multiple sclerosis (HCC) 05/20/2013  . 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 Abnormality of gait 11/23/2012  . HTN (hypertension) 07/04/2011  . 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 Obesity, Class III, BMI 40-49.9 (morbid obesity) (HCC) 01/14/2010  . ADHD 01/14/2010    Past Surgical History:  Procedure Laterality Date  . None         Home Medications    Prior to Admission medications   Medication Sig Start Date  End Date Taking? Authorizing Provider  amLODipine (NORVASC) 10 MG tablet Take 10 mg by mouth daily.    [provider]  apixaban (ELIQUIS STARTER PACK) 5 MG TABS Take 10 mg by mouth 2 (two) times daily. 08/13/17   Rama, Maryruth Bun, MD  benztropine (COGENTIN) 1 MG tablet Take 1 tablet (1 mg total) by mouth 2 (two) times daily. Patient taking differently: Take 1 mg by mouth daily.  04/02/15   Charm Rings, NP  cloZAPine (CLOZARIL) 50 MG tablet Take 0.5 tablets (25 mg total) by mouth 2 (two) times daily. 08/13/17   Rama, Maryruth Bun, MD  INVEGA SUSTENNA 234 MG/1.5ML SUSP injection Inject 234 mg into the muscle every 30 (thirty) days.  05/15/17   [provider]  Melatonin 5 MG TABS Take 10 mg by mouth at bedtime.     [provider]  ocrelizumab in sodium chloride 0.9 % 500 mL 300mg  IV on day one, repeat 300mg  IV on day 15. Then 600mg  IV every 6 months after that 09/02/17   York Spaniel, MD  propranolol (INDERAL) 10 MG tablet Take 1 tablet (10 mg total) by mouth 2 (two) times daily. 05/03/15   Charm Rings, NP  traZODone (DESYREL) 100 MG tablet Take 200 mg by mouth at bedtime as needed for sleep.  05/14/15   [provider]    Family History  Family History  Problem Relation Age of Onset  . Diabetes Mother   . ADD / ADHD Brother     Social History Social History   Tobacco Use  . Smoking status: Current Every Day Smoker    Packs/day: 0.25    Types: Cigarettes  . Smokeless tobacco: Never Used  . Tobacco comment: 4 cigarettes a day  Substance Use Topics  . Alcohol use: Yes    Alcohol/week: 0.0 oz    Comment: "A little bit"   . Drug use: Yes    Types: Marijuana    Comment: Last used: unknown      Allergies   Patient has no known allergies.   Review of Systems Review of Systems  All other systems reviewed and are negative.    Physical Exam Updated Vital Signs BP (!) 140/94   Pulse 74   Temp 97.8 F (36.6 C) (Oral)   Resp 15   SpO2  100%   Physical Exam  Constitutional: He is oriented to person, place, and time. He appears well-developed and well-nourished. He appears lethargic.  Non-toxic appearance. No distress.  HENT:  Head: Normocephalic and atraumatic.  Eyes: Conjunctivae, EOM and lids are normal. Pupils are equal, round, and reactive to light.  Neck: Normal range of motion. Neck supple. No tracheal deviation present. No thyroid mass present.  Cardiovascular: Normal rate, regular rhythm and normal heart sounds. Exam reveals no gallop.  No murmur heard. Pulmonary/Chest: Effort normal and breath sounds normal. No stridor. No respiratory distress. He has no decreased breath sounds. He has no wheezes. He has no rhonchi. He has no rales.  Abdominal: Soft. Normal appearance and bowel sounds are normal. He exhibits no distension. There is no tenderness. There is no rebound and no CVA tenderness.  Musculoskeletal: Normal range of motion. He exhibits no edema or tenderness.  Neurological: He is oriented to person, place, and time. He appears lethargic. No cranial nerve deficit or sensory deficit. GCS eye subscore is 4. GCS verbal subscore is 5. GCS motor subscore is 6.  2/5 strength le bilateral  Skin: Skin is warm and dry. No abrasion and no rash noted.  Psychiatric: His affect is blunt. His speech is slurred. He is slowed.  Nursing note and vitals reviewed.    ED Treatments / Results  Labs (all labs ordered are listed, but only abnormal results are displayed) Labs Reviewed  CBC WITH DIFFERENTIAL/PLATELET  COMPREHENSIVE METABOLIC PANEL  URINALYSIS, ROUTINE W REFLEX MICROSCOPIC    EKG  EKG Interpretation None       Radiology No results found.  Procedures Procedures (including critical care time)  Medications Ordered in ED Medications - No data to display   Initial Impression / Assessment and Plan / ED Course  I have reviewed the triage vital signs and the nursing notes.  Pertinent labs & imaging  results that were available during my care of the patient were reviewed by me and considered in my medical decision making (see chart for details).     Patient's workup here is without acute findings.  Spoke with case management and they will restart patient's home health needs.  Was able to eat a meal here and is stable for discharge  Final Clinical Impressions(s) / ED Diagnoses   Final diagnoses:  None    ED Discharge Orders    None       Lorre Nick, MD 09/22/17 1253

## 2017-09-22 NOTE — Telephone Encounter (Signed)
Pt's son had infusion appt today at 7:30 at [facility]/short stay. She said the pt took his bath this morning and afterwards he was unable to walk. She called and spoke with Lindsey/[facility] short stay and she advised the pt be taken by ambulance to ED to find out why he is not walking.  The patient is currently at ED at [facility], she will call to r/s the ocrevus infusion once she knows what is said at ED. She is waiting for a phone call.  FYI

## 2017-09-22 NOTE — Care Management Note (Signed)
Case Management Note  Patient Details  Name: Brandon [name] MRN: [id number] Date of Birth: Aug 24, 1990  CM contacted for [facility] services.  Noted pt was recently active with [facility].  Contacted [name] who advised pt's services ended on 09/16/2017.  Spoke with Dr. Freida Busman, noted orders were in place.  Contacted [name] via phone to discuss HH.  She requested [facility] start services again and that she has a SW to assist her with other questions she currently has.  Discussed Medicaid PCS which she was aware of and needed help from the SW with the paperwork.  She had no further questions for CM.  CM spoke with [name] with [facility] to advise of new orders.  Pt will transport home via [facility]. Updated [name], RN. No further CM needs noted at this time.  Expected Discharge Date:    09/22/2017              Expected Discharge Plan:  Home w Home Health Services  Discharge planning Services  CM Consult  Post Acute Care Choice:  Home Health Choice offered to:  Patient, Parent  HH Arranged:  RN, PT, OT, Social Work [facility] Agency:   [facility]  Status of Service:  Completed, signed off  [name], [name], RN 09/22/2017, 1:02 PM

## 2017-09-22 NOTE — Telephone Encounter (Signed)
Apparently got up to go to the hospital and get his first dose of Ocrevus today.  The patient was able to walk when he got up, he took a bath, and then later was unable to stand up.  EMS was called.  There was some concerns that he felt warm, I am not sure how hot the bath water was, or whether he is running a fever from some other source.  He has gone to the [facility] emergency room for an evaluation which is appropriate.  The dosing of Ocrevus will need to be rescheduled.

## 2017-09-22 NOTE — ED Triage Notes (Signed)
Transported by [facility] from home--experienced a fall after attempting to transfer from couch to wheelchair. Pt denies any injury, expected to seek treatment for MS today at this facility, but needs an evaluation prior to receiving treatment per staff.

## 2017-09-22 NOTE — ED Notes (Signed)
Bed: KK44 Expected date:  Expected time:  Means of arrival:  Comments: 27 yo Brandon [name], decreased mobility

## 2017-09-28 NOTE — Telephone Encounter (Signed)
I called the mother.  The patient is still fairly weak after going to the emergency room.  He is falling quite a bit.  We will try to get home health nursing out there to give him a several day course  (3) of methylprednisolone.  It is possible he may require a nonemergent ambulance transport to the hospital if he is unable to walk for his next infusion on 06 October 2017.

## 2017-09-28 NOTE — Telephone Encounter (Signed)
Pt mother(on DPR) has called stating that that she would like for Dr Anne Hahn to submit order for EMS services for pt's upcoming Infusion in the event that he is took weak to walk before and after infusion.  She is asking for this so that pt does not miss upcoming appointment. Pt mother is asking for a call back re: this request

## 2017-09-28 NOTE — Addendum Note (Signed)
Addended by: York Spaniel on: 09/28/2017 04:35 PM   Modules accepted: Orders

## 2017-09-28 NOTE — Telephone Encounter (Signed)
I called mother and advised her to call [facility] to inquire about possible transportation and cost. Gave her phone: Ginette Otto [phone]. She will call back if she has further questions/concerns.

## 2017-10-01 ENCOUNTER — Telehealth: Payer: Self-pay | Admitting: *Deleted

## 2017-10-01 NOTE — Telephone Encounter (Signed)
Faxed signed orders back to [facility] re: Solumedrol 500mg  q25hr via gravity x3days. Fax: [phone]. Received fax confirmation.

## 2017-10-05 NOTE — Telephone Encounter (Signed)
Called [name] and spoke with [place] who transferred call to [place]. I advised we are going to try to contact mother to verify if pt will be going to appt. She wants to know if mother will be present because they need to have pt sign consent form for ocrevus. She states she will look at note in the morning to verify if pt coming or not.

## 2017-10-05 NOTE — Telephone Encounter (Signed)
Tried calling Brandon [name] back, went to VM, unable to leave message, VM not set up

## 2017-10-05 NOTE — Telephone Encounter (Signed)
Kim with [facility] is calling. Since patient is taking prednisone will he be at appointment for Ocrevus infusion tomorrow at 8am. If he is coming he needs his mother to sign consent form.and needs to stay with him as in the past. She said she normally does not call the patient.

## 2017-10-05 NOTE — Telephone Encounter (Signed)
Dr. Anne Hahn- I tried contacting mother, LVM

## 2017-10-05 NOTE — Telephone Encounter (Signed)
Tried calling Rhunette(mother on DPR) and LVM for her to call back to verify if she is going to be at appt for Ocrevus infusion tomorrow morning at [facility]. Asked her to call back and let me know.

## 2017-10-05 NOTE — Telephone Encounter (Signed)
I called the mother.  The patient had a good improvement in strength after a single dose of the Solu-Medrol.  I will go ahead and get the Ocrevus infusion tomorrow.  The patient will be able to get to the infusion site.

## 2017-10-06 ENCOUNTER — Ambulatory Visit (HOSPITAL_COMMUNITY)
Admission: RE | Admit: 2017-10-06 | Discharge: 2017-10-06 | Disposition: A | Discharge status: Home / Self Care | Payer: Medicare Other | Source: Ambulatory Visit | Attending: Neurology | Admitting: Neurology

## 2017-10-06 ENCOUNTER — Encounter (HOSPITAL_COMMUNITY): Payer: Self-pay

## 2017-10-06 DIAGNOSIS — G35 Multiple sclerosis: Secondary | ICD-10-CM | POA: Insufficient documentation

## 2017-10-06 DIAGNOSIS — Z789 Other specified health status: Secondary | ICD-10-CM | POA: Insufficient documentation

## 2017-10-06 MED ORDER — SODIUM CHLORIDE 0.9 % IV SOLN
INTRAVENOUS | Status: DC
  Administered 2017-10-06: 09:00:00 via INTRAVENOUS

## 2017-10-06 MED ORDER — SODIUM CHLORIDE 0.9 % IV SOLN
300.0000 mg | INTRAVENOUS | Status: DC
  Administered 2017-10-06: 300 mg via INTRAVENOUS
  Filled 2017-10-06: qty 10

## 2017-10-06 MED ORDER — SODIUM CHLORIDE 0.9 % IV SOLN
500.0000 mg | INTRAVENOUS | Status: DC
  Administered 2017-10-06: 500 mg via INTRAVENOUS
  Filled 2017-10-06: qty 4

## 2017-10-06 MED ORDER — DIPHENHYDRAMINE HCL 25 MG PO CAPS
50.0000 mg | ORAL_CAPSULE | ORAL | Status: DC
  Administered 2017-10-06: 50 mg via ORAL
  Filled 2017-10-06: qty 2

## 2017-10-06 NOTE — Discharge Instructions (Signed)
Ocrelizumab injection / Ocrevus °What is this medicine? °OCRELIZUMAB (ok re LIZ ue mab) treats multiple sclerosis. It helps to decrease the number of multiple sclerosis relapses. It is not a cure. °This medicine may be used for other purposes; ask your health care provider or pharmacist if you have questions. °COMMON BRAND NAME(S): OCREVUS °What should I tell my health care provider before I take this medicine? °They need to know if you have any of these conditions: °-cancer °-hepatitis B infection °-other infection (especially a virus infection such as chickenpox, cold sores, or herpes) °-an unusual or allergic reaction to ocrelizumab, other medicines, foods, dyes or preservatives °-pregnant or trying to get pregnant °-breast-feeding °How should I use this medicine? °This medicine is for infusion into a vein. It is given by a health care professional in a hospital or clinic setting. °Talk to your pediatrician regarding the use of this medicine in children. Special care may be needed. °Overdosage: If you think you have taken too much of this medicine contact a poison control center or emergency room at once. °NOTE: This medicine is only for you. Do not share this medicine with others. °What if I miss a dose? °Keep appointments for follow-up doses as directed. It is important not to miss your dose. Call your doctor or health care professional if you are unable to keep an appointment. °What may interact with this medicine? °-alemtuzumab °-daclizumab °-dimethyl fumarate °-fingolimod °-glatiramer °-interferon beta °-live virus vaccines °-mitoxantrone °-natalizumab °-peginterferon beta °-rituximab °-steroid medicines like prednisone or cortisone °-teriflunomide °This list may not describe all possible interactions. Give your health care provider a list of all the medicines, herbs, non-prescription drugs, or dietary supplements you use. Also tell them if you smoke, drink alcohol, or use illegal drugs. Some items may  interact with your medicine. °What should I watch for while using this medicine? °Tell your doctor or healthcare professional if your symptoms do not start to get better or if they get worse. °This medicine can cause serious allergic reactions. To reduce your risk you may need to take medicine before treatment with this medicine. Take your medicine as directed. °Women should inform their doctor if they wish to become pregnant or think they might be pregnant. There is a potential for serious side effects to an unborn child. Talk to your health care professional or pharmacist for more information. Male patients should use effective birth control methods while receiving this medicine and for 6 months after the last dose. °Call your doctor or health care professional for advice if you get a fever, chills or sore throat, or other symptoms of a cold or flu. Do not treat yourself. This drug decreases your body's ability to fight infections. Try to avoid being around people who are sick. °If you have a hepatitis B infection or a history of a hepatitis B infection, talk to your doctor. The symptoms of hepatitis B may get worse if you take this medicine. °In some patients, this medicine may cause a serious brain infection that may cause death. If you have any problems seeing, thinking, speaking, walking, or standing, tell your doctor right away. If you cannot reach your doctor, urgently seek other source of medical care. °This medicine can decrease the response to a vaccine. If you need to get vaccinated, tell your healthcare professional if you have received this medicine. Extra booster doses may be needed. Talk to your doctor to see if a different vaccination schedule is needed. °Talk to your doctor about your risk   of cancer. You may be more at risk for certain types of cancers if you take this medicine. What side effects may I notice from receiving this medicine? Side effects that you should report to your doctor or  health care professional as soon as possible: -allergic reactions like skin rash, itching or hives, swelling of the face, lips, or tongue -breathing problems -facial flushing -fast, irregular heartbeat -lump or soreness in the breast -signs and symptoms of herpes such as cold sore, shingles, or genital sores -signs and symptoms of infection like fever or chills, cough, sore throat, pain or trouble passing urine -signs and symptoms of low blood pressure like dizziness; feeling faint or lightheaded, falls; unusually weak or tired -signs and symptoms of progressive multifocal leukoencephalopathy (PML) like changes in vision; clumsiness; confusion; personality changes; weakness on one side of the body -swelling of the ankles, feet, hands Side effects that usually do not require medical attention (report these to your doctor or health care professional if they continue or are bothersome): -back pain -depressed mood -diarrhea -pain, redness, or irritation at site where injected This list may not describe all possible side effects. Call your doctor for medical advice about side effects. You may report side effects to FDA at 1-800-FDA-1088. Where should I keep my medicine? This drug is given in a hospital or clinic and will not be stored at home. NOTE: This sheet is a summary. It may not cover all possible information. If you have questions about this medicine, talk to your doctor, pharmacist, or health care provider.  2018 Elsevier/Gold Standard (2015-10-09 09:40:25)

## 2017-10-08 ENCOUNTER — Encounter: Payer: Self-pay | Admitting: Neurology

## 2017-10-08 ENCOUNTER — Telehealth: Payer: Self-pay | Admitting: Neurology

## 2017-10-08 ENCOUNTER — Ambulatory Visit: Payer: Medicare Other | Admitting: Neurology

## 2017-10-08 ENCOUNTER — Telehealth: Payer: Self-pay | Admitting: *Deleted

## 2017-10-08 NOTE — Telephone Encounter (Signed)
This patient cancelled the same day of a RV appointment. 

## 2017-10-08 NOTE — Telephone Encounter (Signed)
Called mother back. Inquired about why pt needed to cx appt today at 2pm with Dr. Anne Hahn. She states she forgot to scheduled transportation and unable to come. She advised she r/s f/u for 12/29/17. I advised he was last seen 06/05/18 and needed to f/u sooner. R/s appt for 11/13/17 at 12pm, check in 1130am.   She requested appt on either Monday, Wednesday, Friday in the afternoon.   I checked with Dr. Anne Hahn and ok for pt to f/u 11/13/17 instead.

## 2017-10-20 ENCOUNTER — Ambulatory Visit (HOSPITAL_COMMUNITY)
Admission: RE | Admit: 2017-10-20 | Discharge: 2017-10-20 | Disposition: A | Discharge status: Home / Self Care | Payer: Medicare Other | Source: Ambulatory Visit | Attending: Neurology | Admitting: Neurology

## 2017-10-20 ENCOUNTER — Encounter (HOSPITAL_COMMUNITY): Payer: Self-pay

## 2017-10-20 DIAGNOSIS — G35 Multiple sclerosis: Secondary | ICD-10-CM | POA: Diagnosis present

## 2017-10-20 MED ORDER — DIPHENHYDRAMINE HCL 25 MG PO CAPS
50.0000 mg | ORAL_CAPSULE | ORAL | Status: AC
  Administered 2017-10-20: 50 mg via ORAL
  Filled 2017-10-20: qty 2

## 2017-10-20 MED ORDER — SODIUM CHLORIDE 0.9 % IV SOLN
INTRAVENOUS | Status: AC
  Administered 2017-10-20: 08:00:00 via INTRAVENOUS

## 2017-10-20 MED ORDER — SODIUM CHLORIDE 0.9 % IV SOLN
300.0000 mg | INTRAVENOUS | Status: AC
  Administered 2017-10-20: 300 mg via INTRAVENOUS
  Filled 2017-10-20: qty 10

## 2017-10-20 MED ORDER — SODIUM CHLORIDE 0.9 % IV SOLN
500.0000 mg | INTRAVENOUS | Status: AC
  Administered 2017-10-20: 500 mg via INTRAVENOUS
  Filled 2017-10-20: qty 4

## 2017-10-20 NOTE — Progress Notes (Signed)
Pt tolerated infusion without symptoms, however, was much drowsier this visit compared to last.  Slept through most of visit and was incontinent of urine. Changed pants to paper scrubs before leaving.  Had more drooling this visit. His mother states he had more saliva production after last infusion of Ocrevus.

## 2017-10-20 NOTE — Discharge Instructions (Signed)
Ocrelizumab injection /ocrevus What is this medicine? OCRELIZUMAB (ok re LIZ ue mab) treats multiple sclerosis. It helps to decrease the number of multiple sclerosis relapses. It is not a cure. This medicine may be used for other purposes; ask your health care provider or pharmacist if you have questions. COMMON BRAND NAME(S): OCREVUS What should I tell my health care provider before I take this medicine? They need to know if you have any of these conditions: -cancer -hepatitis B infection -other infection (especially a virus infection such as chickenpox, cold sores, or herpes) -an unusual or allergic reaction to ocrelizumab, other medicines, foods, dyes or preservatives -pregnant or trying to get pregnant -breast-feeding How should I use this medicine? This medicine is for infusion into a vein. It is given by a health care professional in a hospital or clinic setting. Talk to your pediatrician regarding the use of this medicine in children. Special care may be needed. Overdosage: If you think you have taken too much of this medicine contact a poison control center or emergency room at once. NOTE: This medicine is only for you. Do not share this medicine with others. What if I miss a dose? Keep appointments for follow-up doses as directed. It is important not to miss your dose. Call your doctor or health care professional if you are unable to keep an appointment. What may interact with this medicine? -alemtuzumab -daclizumab -dimethyl fumarate -fingolimod -glatiramer -interferon beta -live virus vaccines -mitoxantrone -natalizumab -peginterferon beta -rituximab -steroid medicines like prednisone or cortisone -teriflunomide This list may not describe all possible interactions. Give your health care provider a list of all the medicines, herbs, non-prescription drugs, or dietary supplements you use. Also tell them if you smoke, drink alcohol, or use illegal drugs. Some items may  interact with your medicine. What should I watch for while using this medicine? Tell your doctor or healthcare professional if your symptoms do not start to get better or if they get worse. This medicine can cause serious allergic reactions. To reduce your risk you may need to take medicine before treatment with this medicine. Take your medicine as directed. Women should inform their doctor if they wish to become pregnant or think they might be pregnant. There is a potential for serious side effects to an unborn child. Talk to your health care professional or pharmacist for more information. Male patients should use effective birth control methods while receiving this medicine and for 6 months after the last dose. Call your doctor or health care professional for advice if you get a fever, chills or sore throat, or other symptoms of a cold or flu. Do not treat yourself. This drug decreases your body's ability to fight infections. Try to avoid being around people who are sick. If you have a hepatitis B infection or a history of a hepatitis B infection, talk to your doctor. The symptoms of hepatitis B may get worse if you take this medicine. In some patients, this medicine may cause a serious brain infection that may cause death. If you have any problems seeing, thinking, speaking, walking, or standing, tell your doctor right away. If you cannot reach your doctor, urgently seek other source of medical care. This medicine can decrease the response to a vaccine. If you need to get vaccinated, tell your healthcare professional if you have received this medicine. Extra booster doses may be needed. Talk to your doctor to see if a different vaccination schedule is needed. Talk to your doctor about your risk of  cancer. You may be more at risk for certain types of cancers if you take this medicine. °What side effects may I notice from receiving this medicine? °Side effects that you should report to your doctor or  health care professional as soon as possible: °-allergic reactions like skin rash, itching or hives, swelling of the face, lips, or tongue °-breathing problems °-facial flushing °-fast, irregular heartbeat °-lump or soreness in the breast °-signs and symptoms of herpes such as cold sore, shingles, or genital sores °-signs and symptoms of infection like fever or chills, cough, sore throat, pain or trouble passing urine °-signs and symptoms of low blood pressure like dizziness; feeling faint or lightheaded, falls; unusually weak or tired °-signs and symptoms of progressive multifocal leukoencephalopathy (PML) like changes in vision; clumsiness; confusion; personality changes; weakness on one side of the body °-swelling of the ankles, feet, hands °Side effects that usually do not require medical attention (report these to your doctor or health care professional if they continue or are bothersome): °-back pain °-depressed mood °-diarrhea °-pain, redness, or irritation at site where injected °This list may not describe all possible side effects. Call your doctor for medical advice about side effects. You may report side effects to FDA at 1-800-FDA-1088. °Where should I keep my medicine? °This drug is given in a hospital or clinic and will not be stored at home. °NOTE: This sheet is a summary. It may not cover all possible information. If you have questions about this medicine, talk to your doctor, pharmacist, or health care provider. °© 2018 Elsevier/Gold Standard (2015-10-09 09:40:25) ° °

## 2017-10-22 ENCOUNTER — Other Ambulatory Visit: Payer: Self-pay

## 2017-10-22 ENCOUNTER — Emergency Department (HOSPITAL_COMMUNITY): Payer: Medicare Other

## 2017-10-22 ENCOUNTER — Emergency Department (HOSPITAL_COMMUNITY)
Admission: EM | Admit: 2017-10-22 | Discharge: 2017-10-23 | Disposition: A | Discharge status: Home / Self Care | Payer: Medicare Other | Attending: Emergency Medicine | Admitting: Emergency Medicine

## 2017-10-22 ENCOUNTER — Encounter (HOSPITAL_COMMUNITY): Payer: Self-pay

## 2017-10-22 DIAGNOSIS — I10 Essential (primary) hypertension: Secondary | ICD-10-CM | POA: Insufficient documentation

## 2017-10-22 DIAGNOSIS — G35 Multiple sclerosis: Secondary | ICD-10-CM | POA: Diagnosis not present

## 2017-10-22 DIAGNOSIS — Y999 Unspecified external cause status: Secondary | ICD-10-CM | POA: Insufficient documentation

## 2017-10-22 DIAGNOSIS — F17210 Nicotine dependence, cigarettes, uncomplicated: Secondary | ICD-10-CM | POA: Diagnosis not present

## 2017-10-22 DIAGNOSIS — W010XXA Fall on same level from slipping, tripping and stumbling without subsequent striking against object, initial encounter: Secondary | ICD-10-CM | POA: Diagnosis not present

## 2017-10-22 DIAGNOSIS — Y929 Unspecified place or not applicable: Secondary | ICD-10-CM | POA: Insufficient documentation

## 2017-10-22 DIAGNOSIS — S199XXA Unspecified injury of neck, initial encounter: Secondary | ICD-10-CM | POA: Diagnosis not present

## 2017-10-22 DIAGNOSIS — Y9301 Activity, walking, marching and hiking: Secondary | ICD-10-CM | POA: Diagnosis not present

## 2017-10-22 DIAGNOSIS — T07XXXA Unspecified multiple injuries, initial encounter: Secondary | ICD-10-CM

## 2017-10-22 NOTE — Progress Notes (Signed)
Orthopedic Tech Progress Note Patient Details:  Brandon [name] December 22, 1990 [id number]  Ortho Devices Type of Ortho Device: Ace wrap, Arm sling, Post (long arm) splint Ortho Device/Splint Location: RUE Ortho Device/Splint Interventions: Ordered, Application   Post Interventions Patient Tolerated: Well Instructions Provided: Care of device   [name] 10/22/2017, 9:29 PM

## 2017-10-22 NOTE — ED Triage Notes (Signed)
Pt had a fall today when trying to ambulate to RR. Pt has MS and typically walks with a walker. C/o bilateral pain in feet and c/o neck pain. Pt placed in [facility] by EMS. Mom reports to EMS that pt has chronic pain r/t MS.

## 2017-10-22 NOTE — Progress Notes (Signed)
Orthopedic Tech Progress Note Patient Details:  Brandon [name] Dec 05, 1990 [id number] Charted on wrong patient. Patient ID: Brandon [name], male   DOB: 05-Jun-1991, 27 y.o.   MRN: [id number]   [name] 10/22/2017, 9:30 PM

## 2017-10-22 NOTE — ED Provider Notes (Signed)
[facility] EMERGENCY DEPARTMENT Provider Note   CSN: [id number] Arrival date & time: 10/22/17  1841     History   Chief Complaint Chief Complaint  Patient presents with  . Fall    HPI Brandon [name] is a 27 y.o. male.  HPI   27 year old male presenting after fall.  He has a past history of multiple sclerosis and typically walks with a walker.  Prior to arrival he was trying to ambulate to the restroom when he lost his balance and fell.  He did strike his head.  No loss of consciousness.  Does complain of some pain in his left lateral neck and his bilateral feet.  He is on Eliquis for a past history of pulmonary embolism.  Past Medical History:  Diagnosis Date  . ADHD (attention deficit hyperactivity disorder)   . Bipolar 1 disorder (HCC)   . Chronic back pain   . Chronic neck pain   . Hypertension   . Multiple sclerosis (HCC) 05/20/2013   left sided weakness, dysarthria  . Non-compliance   . Obesity   . Schizophrenia (HCC)   . 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 Pulmonary embolism (HCC) 08/12/2017  . Hyperglycemia 07/31/2017  . Multiple sclerosis exacerbation (HCC) 07/30/2017  . Acute encephalopathy 07/30/2017  . Paranoid schizophrenia (HCC)   . Hallucinations 08/05/2014  . Auditory hallucination   . Left-sided weakness 01/18/2014  . Multiple sclerosis (HCC) 05/20/2013  . 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 Abnormality of gait 11/23/2012  . HTN (hypertension) 07/04/2011  . 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 Obesity, Class III, BMI 40-49.9 (morbid obesity) (HCC) 01/14/2010  . ADHD 01/14/2010    Past Surgical History:  Procedure Laterality Date  . None          Home Medications    Prior to Admission medications   Medication Sig Start Date End Date Taking? Authorizing Provider  amLODipine (NORVASC) 10 MG tablet Take 10 mg by mouth daily.    [provider]  apixaban (ELIQUIS STARTER PACK) 5 MG TABS Take 10 mg by mouth 2 (two) times daily. 08/13/17   Rama, Maryruth Bun, MD  benztropine (COGENTIN) 1 MG tablet Take 1 tablet (1 mg total) by mouth 2 (two) times daily. Patient taking differently: Take 1 mg by mouth daily.  04/02/15   Charm Rings, NP  cloZAPine (CLOZARIL) 50 MG tablet Take 0.5 tablets (25 mg total) by mouth 2 (two) times daily. 08/13/17   Rama, Maryruth Bun, MD  INVEGA SUSTENNA 234 MG/1.5ML SUSP injection Inject 234 mg into the muscle every 30 (thirty) days.  05/15/17   [provider]  Melatonin 5 MG TABS Take 10 mg by mouth at bedtime.     [provider]  ocrelizumab in sodium chloride 0.9 % 500 mL 300mg  IV on day one, repeat 300mg  IV on day 15. Then 600mg  IV every 6 months after that 09/02/17   York Spaniel, MD  propranolol (INDERAL) 10 MG tablet Take 1 tablet (10 mg total) by mouth 2 (two) times daily. 05/03/15   Charm Rings, NP  traZODone (DESYREL) 100 MG tablet Take 200 mg by mouth at bedtime as needed for sleep.  05/14/15   [provider]    Family History Family History  Problem Relation Age of Onset  . Diabetes Mother   . ADD / ADHD Brother  Social History Social History   Tobacco Use  . Smoking status: Current Every Day Smoker    Packs/day: 0.25    Types: Cigarettes  . Smokeless tobacco: Never Used  . Tobacco comment: 4 cigarettes a day  Substance Use Topics  . Alcohol use: Yes    Alcohol/week: 0.0 oz    Comment: "A little bit"   . Drug use: Yes    Types: Marijuana    Comment: Last used: unknown      Allergies   Patient has no known allergies.   Review of Systems Review of Systems  All systems reviewed and negative, other than as noted in HPI.   Physical Exam Updated Vital Signs BP 107/65   Pulse 72   Temp 99 F (37.2 C) (Oral)   Resp 14   Ht 6\' 1"  (1.854 m)   Wt (!) 147 kg (324 lb)   SpO2 98%   BMI 42.75 kg/m   Physical Exam    Constitutional: He appears well-developed and well-nourished. No distress.  HENT:  Head: Normocephalic and atraumatic.  Eyes: Conjunctivae are normal. Right eye exhibits no discharge. Left eye exhibits no discharge.  Neck: Neck supple.  Cardiovascular: Normal rate, regular rhythm and normal heart sounds. Exam reveals no gallop and no friction rub.  No murmur heard. Pulmonary/Chest: Effort normal and breath sounds normal. No respiratory distress.  Abdominal: Soft. He exhibits no distension. There is no tenderness.  Musculoskeletal: He exhibits no edema or tenderness.  No discrete bony tenderness to the extremities.  No midline spinal tenderness.  No swelling or deformity noted.  Neurological:  Somewhat drowsy, but opens eyes to voice.  His speech is slow, but understandable.  He answered questions appropriately.  Globally weak.  Strength is 3 out of 5 bilateral upper extremities, 2-3 out of 5 lower extremities.  Skin: Skin is warm and dry.  Psychiatric: He has a normal mood and affect. His behavior is normal. Thought content normal.  Nursing note and vitals reviewed.    ED Treatments / Results  Labs (all labs ordered are listed, but only abnormal results are displayed) Labs Reviewed - No data to display  EKG None  Radiology Ct Head Wo Contrast  Result Date: 10/22/2017 CLINICAL DATA:  Fall today, history of MS. Bilateral lower extremity pain and neck pain. EXAM: CT HEAD WITHOUT CONTRAST CT MAXILLOFACIAL WITHOUT CONTRAST CT CERVICAL SPINE WITHOUT CONTRAST TECHNIQUE: Multidetector CT imaging of the head, cervical spine, and maxillofacial structures were performed using the standard protocol without intravenous contrast. Multiplanar CT image reconstructions of the cervical spine and maxillofacial structures were also generated. COMPARISON:  Head CT and cervical spine CT dated 09/22/2017. FINDINGS: CT HEAD FINDINGS Brain: Ventricles are stable in size and configuration. Confluent  hypodensities in the bilateral periventricular and subcortical white matter regions, compatible with the given history of MS, stable appearance. No mass, hemorrhage, edema or other evidence of acute parenchymal abnormality. No extra-axial hemorrhage. Vascular: No hyperdense vessel or unexpected calcification. Skull: Normal. Negative for fracture or focal lesion. Other: None. CT MAXILLOFACIAL FINDINGS Osseous: Lower frontal bones are intact. Stable old slightly displaced deformity of the LEFT nasal bone. Osseous structures about the orbits are intact and normally aligned bilaterally. Bilateral zygomatic arches and pterygoid plates are intact. Walls of the maxillary sinuses are intact and normally aligned bilaterally. No mandible fracture or displacement seen. Orbits: Negative. No traumatic or inflammatory finding. Sinuses: Clear. Soft tissues: No circumscribed soft tissue hematoma. CT CERVICAL SPINE FINDINGS Alignment: Normal. Skull base  and vertebrae: No fracture line or displaced fracture fragment. Facet joints appear intact and normally aligned throughout. Soft tissues and spinal canal: No prevertebral fluid or swelling. No visible canal hematoma. Disc levels: Disc spaces are well maintained throughout. No significant central canal stenosis appreciated at any level. Upper chest: Negative. Other: None. IMPRESSION: 1. No acute intracranial abnormality. No intracranial hemorrhage or edema. No skull fracture. Hypodense areas within the periventricular white matter bilaterally, compatible with the given history of MS, stable compared to previous exams. 2. No acute facial bone fracture or displacement. 3. No fracture or acute subluxation within the cervical spine. Electronically Signed   By: Bary Richard M.D.   On: 10/22/2017 20:37   Ct Cervical Spine Wo Contrast  Result Date: 10/22/2017 CLINICAL DATA:  Fall today, history of MS. Bilateral lower extremity pain and neck pain. EXAM: CT HEAD WITHOUT CONTRAST CT  MAXILLOFACIAL WITHOUT CONTRAST CT CERVICAL SPINE WITHOUT CONTRAST TECHNIQUE: Multidetector CT imaging of the head, cervical spine, and maxillofacial structures were performed using the standard protocol without intravenous contrast. Multiplanar CT image reconstructions of the cervical spine and maxillofacial structures were also generated. COMPARISON:  Head CT and cervical spine CT dated 09/22/2017. FINDINGS: CT HEAD FINDINGS Brain: Ventricles are stable in size and configuration. Confluent hypodensities in the bilateral periventricular and subcortical white matter regions, compatible with the given history of MS, stable appearance. No mass, hemorrhage, edema or other evidence of acute parenchymal abnormality. No extra-axial hemorrhage. Vascular: No hyperdense vessel or unexpected calcification. Skull: Normal. Negative for fracture or focal lesion. Other: None. CT MAXILLOFACIAL FINDINGS Osseous: Lower frontal bones are intact. Stable old slightly displaced deformity of the LEFT nasal bone. Osseous structures about the orbits are intact and normally aligned bilaterally. Bilateral zygomatic arches and pterygoid plates are intact. Walls of the maxillary sinuses are intact and normally aligned bilaterally. No mandible fracture or displacement seen. Orbits: Negative. No traumatic or inflammatory finding. Sinuses: Clear. Soft tissues: No circumscribed soft tissue hematoma. CT CERVICAL SPINE FINDINGS Alignment: Normal. Skull base and vertebrae: No fracture line or displaced fracture fragment. Facet joints appear intact and normally aligned throughout. Soft tissues and spinal canal: No prevertebral fluid or swelling. No visible canal hematoma. Disc levels: Disc spaces are well maintained throughout. No significant central canal stenosis appreciated at any level. Upper chest: Negative. Other: None. IMPRESSION: 1. No acute intracranial abnormality. No intracranial hemorrhage or edema. No skull fracture. Hypodense areas within  the periventricular white matter bilaterally, compatible with the given history of MS, stable compared to previous exams. 2. No acute facial bone fracture or displacement. 3. No fracture or acute subluxation within the cervical spine. Electronically Signed   By: Bary Richard M.D.   On: 10/22/2017 20:37   Dg Foot Complete Left  Result Date: 10/22/2017 CLINICAL DATA:  Fall today, pain. EXAM: LEFT FOOT - COMPLETE 3+ VIEW COMPARISON:  None. FINDINGS: There is no evidence of fracture or dislocation. There is no evidence of arthropathy or other focal bone abnormality. Soft tissues are unremarkable. IMPRESSION: Negative. Electronically Signed   By: Bary Richard M.D.   On: 10/22/2017 20:54   Dg Foot Complete Right  Result Date: 10/22/2017 CLINICAL DATA:  Fall today, pain. EXAM: RIGHT FOOT COMPLETE - 3+ VIEW COMPARISON:  None. FINDINGS: There is no evidence of fracture or dislocation. There is no evidence of arthropathy or other focal bone abnormality. Soft tissues are unremarkable. IMPRESSION: Negative. Electronically Signed   By: Bary Richard M.D.   On: 10/22/2017 20:54  Ct Maxillofacial Wo Contrast  Result Date: 10/22/2017 CLINICAL DATA:  Fall today, history of MS. Bilateral lower extremity pain and neck pain. EXAM: CT HEAD WITHOUT CONTRAST CT MAXILLOFACIAL WITHOUT CONTRAST CT CERVICAL SPINE WITHOUT CONTRAST TECHNIQUE: Multidetector CT imaging of the head, cervical spine, and maxillofacial structures were performed using the standard protocol without intravenous contrast. Multiplanar CT image reconstructions of the cervical spine and maxillofacial structures were also generated. COMPARISON:  Head CT and cervical spine CT dated 09/22/2017. FINDINGS: CT HEAD FINDINGS Brain: Ventricles are stable in size and configuration. Confluent hypodensities in the bilateral periventricular and subcortical white matter regions, compatible with the given history of MS, stable appearance. No mass, hemorrhage, edema or  other evidence of acute parenchymal abnormality. No extra-axial hemorrhage. Vascular: No hyperdense vessel or unexpected calcification. Skull: Normal. Negative for fracture or focal lesion. Other: None. CT MAXILLOFACIAL FINDINGS Osseous: Lower frontal bones are intact. Stable old slightly displaced deformity of the LEFT nasal bone. Osseous structures about the orbits are intact and normally aligned bilaterally. Bilateral zygomatic arches and pterygoid plates are intact. Walls of the maxillary sinuses are intact and normally aligned bilaterally. No mandible fracture or displacement seen. Orbits: Negative. No traumatic or inflammatory finding. Sinuses: Clear. Soft tissues: No circumscribed soft tissue hematoma. CT CERVICAL SPINE FINDINGS Alignment: Normal. Skull base and vertebrae: No fracture line or displaced fracture fragment. Facet joints appear intact and normally aligned throughout. Soft tissues and spinal canal: No prevertebral fluid or swelling. No visible canal hematoma. Disc levels: Disc spaces are well maintained throughout. No significant central canal stenosis appreciated at any level. Upper chest: Negative. Other: None. IMPRESSION: 1. No acute intracranial abnormality. No intracranial hemorrhage or edema. No skull fracture. Hypodense areas within the periventricular white matter bilaterally, compatible with the given history of MS, stable compared to previous exams. 2. No acute facial bone fracture or displacement. 3. No fracture or acute subluxation within the cervical spine. Electronically Signed   By: Bary Richard M.D.   On: 10/22/2017 20:37    Procedures Procedures (including critical care time)  Medications Ordered in ED Medications - No data to display   Initial Impression / Assessment and Plan / ED Course  I have reviewed the triage vital signs and the nursing notes.  Pertinent labs & imaging results that were available during my care of the patient were reviewed by me and considered  in my medical decision making (see chart for details).     27 year old male with neck and bilateral lower extremity pain after a fall.  History of MS with generalized weakness.  Increasing difficulty taking care of himself at home.  He may potentially need placement but there is no indication for acute admission at this time.  Final Clinical Impressions(s) / ED Diagnoses   Final diagnoses:  Multiple contusions    ED Discharge Orders    None       Raeford Razor, MD 10/24/17 1034

## 2017-10-23 NOTE — ED Notes (Signed)
E-Signature not available. Patient/Caregiver verbalizes understanding of discharge instructions, no further questions at this time.

## 2017-10-26 ENCOUNTER — Telehealth: Payer: Self-pay | Admitting: Neurology

## 2017-10-26 DIAGNOSIS — G35 Multiple sclerosis: Secondary | ICD-10-CM

## 2017-10-26 NOTE — Telephone Encounter (Addendum)
Pt's mother called she states the pt has 2 infusions of ocrevus and is not getting any better. Sts he is not walking, when should it get better. She has some questions about the dosing of the ocrevus. She is very concerned. She also said if he does not improve she will not be able to care of him at home. He may need to placed in assisted living or rehab.  Please call to advise.

## 2017-10-26 NOTE — Telephone Encounter (Signed)
I called the mother.  The patient has completed the first 2 doses of Ocrevus, she is wondering why he is not getting better with his walking.  I explained to her that the medication is to prevent new deficits, this does not improve old deficits.  The patient may require home health physical and occupational therapy.  They have used advanced home care previously but apparently the patient had exposed himself to 1 of the nurses, they will refuse to come back.

## 2017-10-27 ENCOUNTER — Inpatient Hospital Stay (HOSPITAL_COMMUNITY)
Admission: EM | Admit: 2017-10-27 | Discharge: 2017-11-02 | DRG: 059 | Disposition: A | Discharge status: Skilled Nursing Facility | Payer: Medicare Other | Attending: Internal Medicine | Admitting: Internal Medicine

## 2017-10-27 ENCOUNTER — Encounter (HOSPITAL_COMMUNITY): Payer: Self-pay

## 2017-10-27 DIAGNOSIS — G4700 Insomnia, unspecified: Secondary | ICD-10-CM | POA: Diagnosis present

## 2017-10-27 DIAGNOSIS — K5900 Constipation, unspecified: Secondary | ICD-10-CM | POA: Diagnosis not present

## 2017-10-27 DIAGNOSIS — R269 Unspecified abnormalities of gait and mobility: Secondary | ICD-10-CM

## 2017-10-27 DIAGNOSIS — F909 Attention-deficit hyperactivity disorder, unspecified type: Secondary | ICD-10-CM | POA: Diagnosis present

## 2017-10-27 DIAGNOSIS — R471 Dysarthria and anarthria: Secondary | ICD-10-CM

## 2017-10-27 DIAGNOSIS — F17210 Nicotine dependence, cigarettes, uncomplicated: Secondary | ICD-10-CM | POA: Diagnosis present

## 2017-10-27 DIAGNOSIS — F209 Schizophrenia, unspecified: Secondary | ICD-10-CM

## 2017-10-27 DIAGNOSIS — R440 Auditory hallucinations: Secondary | ICD-10-CM | POA: Diagnosis present

## 2017-10-27 DIAGNOSIS — G9340 Encephalopathy, unspecified: Secondary | ICD-10-CM | POA: Diagnosis present

## 2017-10-27 DIAGNOSIS — Z79899 Other long term (current) drug therapy: Secondary | ICD-10-CM

## 2017-10-27 DIAGNOSIS — D72829 Elevated white blood cell count, unspecified: Secondary | ICD-10-CM | POA: Diagnosis present

## 2017-10-27 DIAGNOSIS — F200 Paranoid schizophrenia: Secondary | ICD-10-CM | POA: Diagnosis present

## 2017-10-27 DIAGNOSIS — R2681 Unsteadiness on feet: Secondary | ICD-10-CM

## 2017-10-27 DIAGNOSIS — T380X5A Adverse effect of glucocorticoids and synthetic analogues, initial encounter: Secondary | ICD-10-CM | POA: Diagnosis present

## 2017-10-27 DIAGNOSIS — G35 Multiple sclerosis: Secondary | ICD-10-CM | POA: Diagnosis present

## 2017-10-27 DIAGNOSIS — R270 Ataxia, unspecified: Secondary | ICD-10-CM

## 2017-10-27 DIAGNOSIS — F319 Bipolar disorder, unspecified: Secondary | ICD-10-CM | POA: Diagnosis present

## 2017-10-27 DIAGNOSIS — Z9114 Patient's other noncompliance with medication regimen: Secondary | ICD-10-CM

## 2017-10-27 DIAGNOSIS — Z9119 Patient's noncompliance with other medical treatment and regimen: Secondary | ICD-10-CM

## 2017-10-27 DIAGNOSIS — I10 Essential (primary) hypertension: Secondary | ICD-10-CM | POA: Diagnosis present

## 2017-10-27 DIAGNOSIS — R05 Cough: Secondary | ICD-10-CM

## 2017-10-27 DIAGNOSIS — Z86711 Personal history of pulmonary embolism: Secondary | ICD-10-CM

## 2017-10-27 DIAGNOSIS — F201 Disorganized schizophrenia: Secondary | ICD-10-CM

## 2017-10-27 DIAGNOSIS — R059 Cough, unspecified: Secondary | ICD-10-CM

## 2017-10-27 DIAGNOSIS — Z7901 Long term (current) use of anticoagulants: Secondary | ICD-10-CM

## 2017-10-27 DIAGNOSIS — Z818 Family history of other mental and behavioral disorders: Secondary | ICD-10-CM

## 2017-10-27 DIAGNOSIS — Z6841 Body Mass Index (BMI) 40.0 and over, adult: Secondary | ICD-10-CM

## 2017-10-27 DIAGNOSIS — R531 Weakness: Secondary | ICD-10-CM

## 2017-10-27 NOTE — ED Triage Notes (Signed)
Pt coming from home due to pt family stating pt is more lethargic. Pt does have history of MS.

## 2017-10-27 NOTE — ED Provider Notes (Signed)
TIME SEEN: 11:43 PM  CHIEF COMPLAINT: Weakness, altered mental status  HPI: Patient is a 27 year old male with history of hypertension, multiple sclerosis with medical noncompliance, PE on Eliquis, schizophrenia who presents to the emergency department with generalized weakness.  When I evaluate the patient there is no one at the bedside.  He is unable to provide me with much history.  When I asked the patient what is wrong he states "I am down".  He is unable to tell me when he started feeling poorly.  Unable to answer many questions or follow commands.  ROS: Level 5 caveat secondary to altered mental status  PAST MEDICAL HISTORY/PAST SURGICAL HISTORY:  Past Medical History:  Diagnosis Date  . ADHD (attention deficit hyperactivity disorder)   . Bipolar 1 disorder (HCC)   . Chronic back pain   . Chronic neck pain   . Hypertension   . Multiple sclerosis (HCC) 05/20/2013   left sided weakness, dysarthria  . Non-compliance   . Obesity   . Schizophrenia (HCC)   . White matter abnormality on MRI of brain 11/23/2012    MEDICATIONS:  Prior to Admission medications   Medication Sig Start Date End Date Taking? Authorizing Provider  amLODipine (NORVASC) 10 MG tablet Take 10 mg by mouth daily.    [provider]  apixaban (ELIQUIS STARTER PACK) 5 MG TABS Take 10 mg by mouth 2 (two) times daily. 08/13/17   Rama, Maryruth Bun, MD  benztropine (COGENTIN) 1 MG tablet Take 1 tablet (1 mg total) by mouth 2 (two) times daily. Patient taking differently: Take 1 mg by mouth daily.  04/02/15   Charm Rings, NP  cloZAPine (CLOZARIL) 50 MG tablet Take 0.5 tablets (25 mg total) by mouth 2 (two) times daily. 08/13/17   Rama, Maryruth Bun, MD  INVEGA SUSTENNA 234 MG/1.5ML SUSP injection Inject 234 mg into the muscle every 30 (thirty) days.  05/15/17   [provider]  Melatonin 5 MG TABS Take 10 mg by mouth at bedtime.     [provider]  ocrelizumab in sodium chloride 0.9 % 500 mL  300mg  IV on day one, repeat 300mg  IV on day 15. Then 600mg  IV every 6 months after that 09/02/17   York Spaniel, MD  propranolol (INDERAL) 10 MG tablet Take 1 tablet (10 mg total) by mouth 2 (two) times daily. 05/03/15   Charm Rings, NP  traZODone (DESYREL) 100 MG tablet Take 200 mg by mouth at bedtime as needed for sleep.  05/14/15   [provider]    ALLERGIES:  No Known Allergies  SOCIAL HISTORY:  Social History   Tobacco Use  . Smoking status: Current Every Day Smoker    Packs/day: 0.25    Types: Cigarettes  . Smokeless tobacco: Never Used  . Tobacco comment: 4 cigarettes a day  Substance Use Topics  . Alcohol use: Yes    Alcohol/week: 0.0 oz    Comment: "A little bit"     FAMILY HISTORY: Family History  Problem Relation Age of Onset  . Diabetes Mother   . ADD / ADHD Brother     EXAM: BP (!) 134/98 (BP Location: Right Arm)   Pulse (!) 108   Temp 98.3 F (36.8 C) (Oral)   Resp 16   Ht 6\' 1"  (1.854 m)   Wt (!) 147 kg (324 lb)   SpO2 96%   BMI 42.75 kg/m  CONSTITUTIONAL: Alert and smiling and laughing at times.  Will answer some  questions but is very difficult to understand secondary to dysarthria.  Does not follow commands.  Is able to look around the room. HEAD: Normocephalic, atraumatic EYES: Conjunctivae clear, pupils appear equal, EOMI ENT: normal nose; moist mucous membranes NECK: Supple, no meningismus, no nuchal rigidity, no LAD  CARD: RRR; S1 and S2 appreciated; no murmurs, no clicks, no rubs, no gallops RESP: Normal chest excursion without splinting or tachypnea; breath sounds clear and equal bilaterally; no wheezes, no rhonchi, no rales, no hypoxia or respiratory distress, speaking full sentences ABD/GI: Normal bowel sounds; non-distended; soft, non-tender, no rebound, no guarding, no peritoneal signs, no hepatosplenomegaly BACK:  The back appears normal and is non-tender to palpation, there is no CVA tenderness EXT: Normal ROM in all  joints; non-tender to palpation; no edema; normal capillary refill; no cyanosis, no calf tenderness or swelling    SKIN: Normal color for age and race; warm; no rash NEURO: When I lift his extremities off the bed he is unable to hold them up and drops them immediately.  He does have some dysarthria and seems to have some expressive aphasia as well.  No obvious facial droop.  Has a hard time following commands. PSYCH: Laughing and smiling intermittently.  MEDICAL DECISION MAKING: Patient here with generalized weakness.  Unclear when he was last seen normal.  Family not currently at bedside.  Concern for possible MS flare.  Also concern for possible infection such as UTI.  He does appear to have recently been placed on Eliquis for PE.  He is hemodynamically stable with no signs of head trauma but will start with head CT, obtain labs and urine and then ultimately an MRI of the brain with and without contrast.  I suspect he will need admission.  ED PROGRESS: Patient's father is now at the bedside along with his uncle.  They report that the patient has not been able to walk since last being seen in the emergency department on April 18.  Patient is receiving infusions of Ocrevus and family reports he is not getting better.  Family reports that when he is doing well he is able to ambulate on his own.  They feel they cannot care for him at home and he needs placement.  They deny any recent head injury or trauma.  No recent fevers, cough, vomiting or diarrhea.  Blood sugar here is normal.  6:25 AM  Pt's labs, urine unremarkable other than urine showing small ketones.  He has received IV fluids.  CT of the head showed no intracranial hemorrhage.  MRI of his brain shows severe chronic demyelination with several hyper acute and acute demyelinating plaques.  This may be the cause of his change in mental status and weakness.  Will discuss with neuro hospitalist for consultation and give IV Solu-Medrol 1 g.  Will discuss  with medicine for admission.   6:45 AM  D/w Dr. Otelia Limes with neurology.  He agrees with medical admission.  Daytime neurology team will consult on patient.  Will discuss with medicine for admission.  Patient is now talking more to nursing staff and moving his extremities slightly more.  I still feel he will need admission.  Family is concerned that they are unable to care for him at home.  He may need placement after this hospitalization.   7:53 AM Discussed patient's case with hospitalist, Toya Smothers NP.  I have recommended admission and patient (and family if present) agree with this plan. Admitting physician will place admission orders.  I reviewed all nursing notes, vitals, pertinent previous records, EKGs, lab and urine results, imaging (as available).    EKG Interpretation  Date/Time:  Tuesday October 27 2017 22:45:00 EDT Ventricular Rate:  96 PR Interval:    QRS Duration: 96 QT Interval:  351 QTC Calculation: 444 R Axis:   28 Text Interpretation:  Sinus rhythm ST elev, probable normal early repol pattern No significant change since last tracing Confirmed by Ward, Baxter Hire [phone]) on 10/28/2017 12:26:04 AM           Ward, Layla Maw, DO 10/28/17 9811

## 2017-10-28 ENCOUNTER — Emergency Department (HOSPITAL_COMMUNITY): Payer: Medicare Other

## 2017-10-28 ENCOUNTER — Encounter (HOSPITAL_COMMUNITY): Payer: Self-pay | Admitting: Internal Medicine

## 2017-10-28 ENCOUNTER — Telehealth: Payer: Self-pay | Admitting: Neurology

## 2017-10-28 DIAGNOSIS — G35 Multiple sclerosis: Secondary | ICD-10-CM | POA: Diagnosis present

## 2017-10-28 DIAGNOSIS — F319 Bipolar disorder, unspecified: Secondary | ICD-10-CM | POA: Diagnosis present

## 2017-10-28 DIAGNOSIS — Z6841 Body Mass Index (BMI) 40.0 and over, adult: Secondary | ICD-10-CM | POA: Diagnosis not present

## 2017-10-28 DIAGNOSIS — Z818 Family history of other mental and behavioral disorders: Secondary | ICD-10-CM | POA: Diagnosis not present

## 2017-10-28 DIAGNOSIS — K5900 Constipation, unspecified: Secondary | ICD-10-CM | POA: Diagnosis not present

## 2017-10-28 DIAGNOSIS — G4700 Insomnia, unspecified: Secondary | ICD-10-CM | POA: Diagnosis present

## 2017-10-28 DIAGNOSIS — F17210 Nicotine dependence, cigarettes, uncomplicated: Secondary | ICD-10-CM | POA: Diagnosis present

## 2017-10-28 DIAGNOSIS — Z9119 Patient's noncompliance with other medical treatment and regimen: Secondary | ICD-10-CM | POA: Diagnosis not present

## 2017-10-28 DIAGNOSIS — F200 Paranoid schizophrenia: Secondary | ICD-10-CM | POA: Diagnosis present

## 2017-10-28 DIAGNOSIS — R531 Weakness: Secondary | ICD-10-CM | POA: Diagnosis not present

## 2017-10-28 DIAGNOSIS — Z86711 Personal history of pulmonary embolism: Secondary | ICD-10-CM | POA: Diagnosis not present

## 2017-10-28 DIAGNOSIS — F1290 Cannabis use, unspecified, uncomplicated: Secondary | ICD-10-CM | POA: Diagnosis not present

## 2017-10-28 DIAGNOSIS — R4182 Altered mental status, unspecified: Secondary | ICD-10-CM | POA: Diagnosis not present

## 2017-10-28 DIAGNOSIS — I10 Essential (primary) hypertension: Secondary | ICD-10-CM | POA: Diagnosis present

## 2017-10-28 DIAGNOSIS — R269 Unspecified abnormalities of gait and mobility: Secondary | ICD-10-CM | POA: Diagnosis not present

## 2017-10-28 DIAGNOSIS — Z9114 Patient's other noncompliance with medication regimen: Secondary | ICD-10-CM | POA: Diagnosis not present

## 2017-10-28 DIAGNOSIS — F909 Attention-deficit hyperactivity disorder, unspecified type: Secondary | ICD-10-CM | POA: Diagnosis present

## 2017-10-28 DIAGNOSIS — Z7901 Long term (current) use of anticoagulants: Secondary | ICD-10-CM | POA: Diagnosis not present

## 2017-10-28 DIAGNOSIS — G9340 Encephalopathy, unspecified: Secondary | ICD-10-CM | POA: Diagnosis present

## 2017-10-28 DIAGNOSIS — T380X5A Adverse effect of glucocorticoids and synthetic analogues, initial encounter: Secondary | ICD-10-CM | POA: Diagnosis present

## 2017-10-28 DIAGNOSIS — R270 Ataxia, unspecified: Secondary | ICD-10-CM | POA: Diagnosis not present

## 2017-10-28 DIAGNOSIS — Z79899 Other long term (current) drug therapy: Secondary | ICD-10-CM | POA: Diagnosis not present

## 2017-10-28 DIAGNOSIS — F201 Disorganized schizophrenia: Secondary | ICD-10-CM | POA: Diagnosis not present

## 2017-10-28 DIAGNOSIS — R440 Auditory hallucinations: Secondary | ICD-10-CM | POA: Diagnosis not present

## 2017-10-28 DIAGNOSIS — D72829 Elevated white blood cell count, unspecified: Secondary | ICD-10-CM | POA: Diagnosis present

## 2017-10-28 DIAGNOSIS — F209 Schizophrenia, unspecified: Secondary | ICD-10-CM | POA: Diagnosis not present

## 2017-10-28 LAB — URINALYSIS, ROUTINE W REFLEX MICROSCOPIC
Bilirubin Urine: NEGATIVE
Glucose, UA: NEGATIVE mg/dL
Hgb urine dipstick: NEGATIVE
Ketones, ur: 20 mg/dL — AB
Leukocytes, UA: NEGATIVE
Nitrite: NEGATIVE
Protein, ur: NEGATIVE mg/dL
Specific Gravity, Urine: 1.021 (ref 1.005–1.030)
pH: 6 (ref 5.0–8.0)

## 2017-10-28 LAB — CBC WITH DIFFERENTIAL/PLATELET
Basophils Absolute: 0 10*3/uL (ref 0.0–0.1)
Basophils Relative: 0 %
Eosinophils Absolute: 0.1 10*3/uL (ref 0.0–0.7)
Eosinophils Relative: 2 %
HCT: 41.8 % (ref 39.0–52.0)
Hemoglobin: 13.3 g/dL (ref 13.0–17.0)
Lymphocytes Relative: 22 %
Lymphs Abs: 1.6 10*3/uL (ref 0.7–4.0)
MCH: 25.1 pg — ABNORMAL LOW (ref 26.0–34.0)
MCHC: 31.8 g/dL (ref 30.0–36.0)
MCV: 78.9 fL (ref 78.0–100.0)
Monocytes Absolute: 0.8 10*3/uL (ref 0.1–1.0)
Monocytes Relative: 10 %
Neutro Abs: 4.9 10*3/uL (ref 1.7–7.7)
Neutrophils Relative %: 66 %
Platelets: 217 10*3/uL (ref 150–400)
RBC: 5.3 MIL/uL (ref 4.22–5.81)
RDW: 14.8 % (ref 11.5–15.5)
WBC: 7.4 10*3/uL (ref 4.0–10.5)

## 2017-10-28 LAB — COMPREHENSIVE METABOLIC PANEL
ALT: 27 U/L (ref 17–63)
AST: 16 U/L (ref 15–41)
Albumin: 3.8 g/dL (ref 3.5–5.0)
Alkaline Phosphatase: 66 U/L (ref 38–126)
Anion gap: 12 (ref 5–15)
BUN: 10 mg/dL (ref 6–20)
CO2: 23 mmol/L (ref 22–32)
Calcium: 9.1 mg/dL (ref 8.9–10.3)
Chloride: 100 mmol/L — ABNORMAL LOW (ref 101–111)
Creatinine, Ser: 1.22 mg/dL (ref 0.61–1.24)
GFR calc Af Amer: >60 mL/min (ref >60)
GFR calc non Af Amer: >60 mL/min (ref >60)
Glucose, Bld: 91 mg/dL (ref 65–99)
Potassium: 3.7 mmol/L (ref 3.5–5.1)
Sodium: 135 mmol/L (ref 135–145)
Total Bilirubin: 1 mg/dL (ref 0.3–1.2)
Total Protein: 6.7 g/dL (ref 6.5–8.1)

## 2017-10-28 LAB — RAPID URINE DRUG SCREEN, HOSP PERFORMED
Amphetamines: NOT DETECTED
Barbiturates: NOT DETECTED
Benzodiazepines: NOT DETECTED
Cocaine: NOT DETECTED
Opiates: NOT DETECTED
Tetrahydrocannabinol: NOT DETECTED

## 2017-10-28 LAB — CBG MONITORING, ED: Glucose-Capillary: 86 mg/dL (ref 65–99)

## 2017-10-28 LAB — ETHANOL: Alcohol, Ethyl (B): <10 mg/dL (ref <10)

## 2017-10-28 MED ORDER — ACETAMINOPHEN 325 MG PO TABS
650.0000 mg | ORAL_TABLET | Freq: Four times a day (QID) | ORAL | Status: DC | PRN
  Administered 2017-10-29: 650 mg via ORAL
  Filled 2017-10-28: qty 2

## 2017-10-28 MED ORDER — PROPRANOLOL HCL 10 MG PO TABS
10.0000 mg | ORAL_TABLET | Freq: Two times a day (BID) | ORAL | Status: DC
  Administered 2017-10-28 – 2017-11-02 (×10): 10 mg via ORAL
  Filled 2017-10-28 (×14): qty 1

## 2017-10-28 MED ORDER — TRAZODONE HCL 100 MG PO TABS
200.0000 mg | ORAL_TABLET | Freq: Every evening | ORAL | Status: DC | PRN
  Administered 2017-10-29: 200 mg via ORAL
  Filled 2017-10-28: qty 2

## 2017-10-28 MED ORDER — APIXABAN 5 MG PO TABS
5.0000 mg | ORAL_TABLET | Freq: Two times a day (BID) | ORAL | Status: DC
  Administered 2017-10-28 – 2017-11-02 (×10): 5 mg via ORAL
  Filled 2017-10-28 (×10): qty 1

## 2017-10-28 MED ORDER — CLOZAPINE 100 MG PO TABS
150.0000 mg | ORAL_TABLET | Freq: Every day | ORAL | Status: DC
  Filled 2017-10-28 (×3): qty 2

## 2017-10-28 MED ORDER — ONDANSETRON HCL 4 MG/2ML IJ SOLN: 4.0000 mg | Freq: Four times a day (QID) | INTRAMUSCULAR | Status: DC | PRN

## 2017-10-28 MED ORDER — SENNOSIDES-DOCUSATE SODIUM 8.6-50 MG PO TABS: 1.0000 | ORAL_TABLET | Freq: Every evening | ORAL | Status: DC | PRN

## 2017-10-28 MED ORDER — ACETAMINOPHEN 650 MG RE SUPP: 650.0000 mg | Freq: Four times a day (QID) | RECTAL | Status: DC | PRN

## 2017-10-28 MED ORDER — METHYLPREDNISOLONE SODIUM SUCC 1000 MG IJ SOLR
1000.0000 mg | Freq: Once | INTRAMUSCULAR | Status: AC
  Administered 2017-10-28: 1000 mg via INTRAVENOUS
  Filled 2017-10-28: qty 8

## 2017-10-28 MED ORDER — ELIQUIS 5 MG VTE STARTER PACK: 10.0000 mg | ORAL_TABLET | Freq: Two times a day (BID) | ORAL | Status: DC

## 2017-10-28 MED ORDER — SODIUM CHLORIDE 0.9 % IV SOLN
INTRAVENOUS | Status: AC
  Administered 2017-10-28 – 2017-10-29 (×2): via INTRAVENOUS

## 2017-10-28 MED ORDER — SODIUM CHLORIDE 0.9 % IV BOLUS
1000.0000 mL | Freq: Once | INTRAVENOUS | Status: AC
  Administered 2017-10-28: 1000 mL via INTRAVENOUS

## 2017-10-28 MED ORDER — BENZTROPINE MESYLATE 1 MG PO TABS: 1.0000 mg | ORAL_TABLET | Freq: Every day | ORAL | Status: DC

## 2017-10-28 MED ORDER — GADOBENATE DIMEGLUMINE 529 MG/ML IV SOLN
20.0000 mL | Freq: Once | INTRAVENOUS | Status: AC | PRN
  Administered 2017-10-28: 20 mL via INTRAVENOUS

## 2017-10-28 MED ORDER — SODIUM CHLORIDE 0.9 % IV SOLN
1000.0000 mg | Freq: Every day | INTRAVENOUS | Status: AC
  Administered 2017-10-28 – 2017-10-29 (×2): 1000 mg via INTRAVENOUS
  Filled 2017-10-28 (×2): qty 8

## 2017-10-28 MED ORDER — ONDANSETRON HCL 4 MG PO TABS: 4.0000 mg | ORAL_TABLET | Freq: Four times a day (QID) | ORAL | Status: DC | PRN

## 2017-10-28 MED ORDER — AMLODIPINE BESYLATE 10 MG PO TABS
10.0000 mg | ORAL_TABLET | Freq: Every day | ORAL | Status: DC
  Administered 2017-10-28 – 2017-11-02 (×6): 10 mg via ORAL
  Filled 2017-10-28: qty 1
  Filled 2017-10-28: qty 2
  Filled 2017-10-28: qty 1
  Filled 2017-10-28: qty 2
  Filled 2017-10-28 (×3): qty 1

## 2017-10-28 NOTE — ED Notes (Signed)
Accepting nurse to call back for report 

## 2017-10-28 NOTE — Telephone Encounter (Signed)
Patient mother calling stating the patient is at the ED waiting on a bed and they have started steroid drips. She would like a call back to discuss.

## 2017-10-28 NOTE — Progress Notes (Addendum)
Patient arrived to room, oriented to room/unit.  Vitals stable. Bath given. Left message with mother to update on patient location. Unable to complete admission at this time. Continue to monitor patient.

## 2017-10-28 NOTE — ED Notes (Signed)
Updated family on plan of care.

## 2017-10-28 NOTE — Consult Note (Signed)
NEURO HOSPITALIST CONSULT NOTE   Requestig physician: Dr. Ophelia Charter   Reason for Consult Generalized weakness due to diagnosed MS   History obtained from:  Patient  Chart    HPI:                                                                                                                                          Brandon [name] is an 27 y.o. male with a history of HTN, Obesity, MS with non-compliance diagnosed in 2015,  Currently prescribed Ocrevus for MS, Schizophrenia, and PE being treated with Eliquis who presents to the Emergency department with weakness, and altered mental status. Unknown when he was last seen normal.  When I examined the patient he was very lethargic, but did respond to most questions and commands if given time. Patient stated " he felt very weak in his legs when he tried to walk so he just sat down". Denies falling or hitting his head.  Head CT w/o contrast showed:(10/28/17) 1. No acute intracranial abnormalities. 2. Chronic atrophy and white matter changes compatible with history of multiple sclerosis. No significant change since previous study.  MRI of brain with and without contrast showed:1. Motion degraded examination. Severe chronic demyelination with a few hyperacute and acute demyelinating plaques. 2. Moderate parenchymal brain volume loss. . U/A + for ketones, BG=86, CBC w/ diff, CMP- normal, urine toxicology screen negative, Etoh screen also was negative.    Patient was last seen in the Emergency department s/p fall on 10/22/17. He did hit his head no loss of consciousness was discharged to home. CT W/O contrast at that time showed no hemorrhage or edema. CT spine showed no fractures.   Past Medical History:  Diagnosis Date  . ADHD (attention deficit hyperactivity disorder)   . Bipolar 1 disorder (HCC)   . Chronic back pain   . Chronic neck pain   . Hypertension   . Multiple sclerosis (HCC) 05/20/2013   left sided weakness, dysarthria   . Non-compliance   . Obesity   . Schizophrenia (HCC)   . White matter abnormality on MRI of brain 11/23/2012    Past Surgical History:  Procedure Laterality Date  . None      Family History  Problem Relation Age of Onset  . Diabetes Mother   . ADD / ADHD Brother                 Social History:  reports that he has been smoking cigarettes.  He has been smoking about 0.25 packs per day. He has never used smokeless tobacco. He reports that he drinks alcohol. He reports that he has current or past drug history. Drug: Marijuana.  No Known Allergies  MEDICATIONS:  Current Facility-Administered Medications  Medication Dose Route Frequency Provider Last Rate Last Dose  . 0.9 %  sodium chloride infusion   Intravenous Continuous Black, Lesle Chris, NP      . acetaminophen (TYLENOL) tablet 650 mg  650 mg Oral Q6H PRN Gwenyth Bender, NP       Or  . acetaminophen (TYLENOL) suppository 650 mg  650 mg Rectal Q6H PRN Black, Lesle Chris, NP      . amLODipine (NORVASC) tablet 10 mg  10 mg Oral Daily Black, Karen M, NP      . benztropine (COGENTIN) tablet 1 mg  1 mg Oral Daily Black, Karen M, NP      . cloZAPine (CLOZARIL) tablet 25 mg  25 mg Oral BID Black, Karen M, NP      . ondansetron [facility]) tablet 4 mg  4 mg Oral Q6H PRN Gwenyth Bender, NP       Or  . ondansetron [facility]) injection 4 mg  4 mg Intravenous Q6H PRN Black, Karen M, NP      . propranolol (INDERAL) tablet 10 mg  10 mg Oral BID Black, Karen M, NP      . senna-docusate (Senokot-S) tablet 1 tablet  1 tablet Oral QHS PRN Gwenyth Bender, NP      . traZODone (DESYREL) tablet 200 mg  200 mg Oral QHS PRN Gwenyth Bender, NP       Current Outpatient Medications  Medication Sig Dispense Refill  . amLODipine (NORVASC) 10 MG tablet Take 10 mg by mouth daily.    Marland Kitchen apixaban (ELIQUIS STARTER PACK) 5 MG TABS Take 10 mg by mouth 2 (two)  times daily. 30 tablet 0  . benztropine (COGENTIN) 1 MG tablet Take 1 tablet (1 mg total) by mouth 2 (two) times daily. (Patient taking differently: Take 1 mg by mouth daily. ) 60 tablet 0  . cloZAPine (CLOZARIL) 50 MG tablet Take 0.5 tablets (25 mg total) by mouth 2 (two) times daily. 60 tablet 2  . INVEGA SUSTENNA 234 MG/1.5ML SUSP injection Inject 234 mg into the muscle every 30 (thirty) days.     . Melatonin 5 MG TABS Take 10 mg by mouth at bedtime.     Marland Kitchen ocrelizumab in sodium chloride 0.9 % 500 mL 300mg  IV on day one, repeat 300mg  IV on day 15. Then 600mg  IV every 6 months after that 2 each 1  . propranolol (INDERAL) 10 MG tablet Take 1 tablet (10 mg total) by mouth 2 (two) times daily. 60 tablet 0  . traZODone (DESYREL) 100 MG tablet Take 200 mg by mouth at bedtime as needed for sleep.        ROS:                                                                                                                                       History obtained mostly  from chart unable to obtain much history from patient given drowsiness vs mental status.   Blood pressure 108/90, pulse 88, temperature 98.3 F (36.8 C), temperature source Oral, resp. rate 14, height 6\' 1"  (1.854 m), weight (!) 147 kg (324 lb), SpO2 98 %.   General Examination:                                                                                                       Physical Exam  HEENT-  Normocephalic, no lesions, without obvious abnormality.  Normal external eye and conjunctiva.   Cardiovascular- S1-S2 audible, pulses palpable throughout   Lungs-no rhonchi or wheezing noted, no excessive working breathing.  Saturations within normal limits Abdomen- All 4 quadrants palpated and nontender Extremities- Warm, dry and intact Musculoskeletal-no joint tenderness, deformity or swelling Skin-warm and dry, no hyperpigmentation, vitiligo, or suspicious lesions  Neurological Examination Mental Status: drowsy, oriented to  person, place, and time.  Speaking in a low-tone  Able to follow 3 step commands if given time. Cranial Nerves: II:  Visual fields grossly normal,  III,IV, VI: ptosis not present, extra-ocular motions intact bilaterally pupils equal, round, reactive to light and accommodation V,VII: smile asymmetric, facial light touch sensation normal bilaterally VIII: hearing normal bilaterally IX,X: uvula rises symmetrically XI: bilateral shoulder shrug XII: midline tongue extension Motor: Right : Upper extremity   4/5                                   Left:     Upper extremity   4/5  Lower extremity  2/5 proximal  4/5 distal ( with poor effort)    Lower extremity  2/5 proximal 4/5 distal ( poor effort) Tone and bulk:normal tone throughout; no atrophy noted Sensory:  light touch intact throughout, bilaterally Deep Tendon Reflexes: 2+ and symmetric throughout Plantars: Right: downgoing   Left: downgoing Cerebellar: Unable to assess due to drowsiness Gait: unable to assess   Lab Results: Basic Metabolic Panel: Recent Labs  Lab 10/28/17 0030  NA 135  K 3.7  CL 100*  CO2 23  GLUCOSE 91  BUN 10  CREATININE 1.22  CALCIUM 9.1    CBC: Recent Labs  Lab 10/28/17 0030  WBC 7.4  NEUTROABS 4.9  HGB 13.3  HCT 41.8  MCV 78.9  PLT 217    Cardiac Enzymes: No results for input(s): CKTOTAL, CKMB, CKMBINDEX, TROPONINI in the last 168 hours.  Lipid Panel: No results for input(s): CHOL, TRIG, HDL, CHOLHDL, VLDL, LDLCALC in the last 168 hours.  Imaging: Ct Head Wo Contrast  Result Date: 10/28/2017 CLINICAL DATA:  Altered level of consciousness. Patient is more lethargic. History of multiple sclerosis. EXAM: CT HEAD WITHOUT CONTRAST TECHNIQUE: Contiguous axial images were obtained from the base of the skull through the vertex without intravenous contrast. COMPARISON:  10/22/2017 FINDINGS: Brain: Mild diffuse cerebral atrophy is prominent for patient's age. Mild ventricular dilatation consistent  with central atrophy. Patchy low-attenuation changes throughout the deep white matter are nonspecific but compatible  with history of multiple sclerosis. Appearances are similar to previous study. No mass-effect or midline shift. No abnormal extra-axial fluid collections. Gray-white matter junctions are distinct. Basal cisterns are not effaced. No acute intracranial hemorrhage. Vascular: No hyperdense vessel or unexpected calcification. Skull: Normal. Negative for fracture or focal lesion. Sinuses/Orbits: No acute finding. Other: None. IMPRESSION: 1. No acute intracranial abnormalities. 2. Chronic atrophy and white matter changes compatible with history of multiple sclerosis. No significant change since previous study. Electronically Signed   By: Burman Nieves M.D.   On: 10/28/2017 01:28   Mr Brain W And Wo Contrast  Result Date: 10/28/2017 CLINICAL DATA:  Larey Seat. On Eliquis for pulmonary embolism. History of multiple sclerosis. EXAM: MRI HEAD WITHOUT AND WITH CONTRAST TECHNIQUE: Multiplanar, multiecho pulse sequences of the brain and surrounding structures were obtained without and with intravenous contrast. CONTRAST:  20mL MULTIHANCE GADOBENATE DIMEGLUMINE 529 MG/ML IV SOLN COMPARISON:  CT HEAD October 28, 2017 and MRI of the head July 30, 2017 FINDINGS: Multiple sequences are moderately motion degraded. INTRACRANIAL CONTENTS: Confluent supratentorial white matter FLAIR T2 hyperintensities in periventricular, deep and juxta cortical distribution relatively unchanged. Lesions demonstrate low T1 signal seen with black holes of demyelination. Some lesions demonstrate faint reduced diffusion. Lesions are confluent in RIGHT frontal white matter. Subcentimeter enhancement RIGHT parietal white matter. Moderate parenchymal brain volume loss. Cavum septum pellucidum et vergae. No midline shift, mass effect or masses. No abnormal extra-axial fluid collections. VASCULAR: Normal major intracranial vascular flow voids  present at skull base. SKULL AND UPPER CERVICAL SPINE: No abnormal sellar expansion. No suspicious calvarial bone marrow signal. Craniocervical junction maintained. SINUSES/ORBITS: The mastoid air-cells and included paranasal sinuses are well-aerated.The included ocular globes and orbital contents are non-suspicious. OTHER: None. IMPRESSION: 1. Motion degraded examination. Severe chronic demyelination with a few hyperacute and acute demyelinating plaques. 2. Moderate parenchymal brain volume loss. Electronically Signed   By: Awilda Metro M.D.   On: 10/28/2017 06:02    Assessment and plan per attending neurologist  Valentina Lucks- NP-C [facility] [phone]  10/28/2017, 8:49 AM   Assessment/Plan: 27 y.o. male with a history of HTN, Obesity, MS with non-compliance diagnosed in 2015,  Currently prescribed Ocrevus for MS, Schizophrenia, and PE being treated with Eliquis. MRI brian shows severe chronic MS lesions with few small acute demyelinating lesions. On exam, patient appears apthatic.   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 I have reviewed the contents of history and physical exam as documented by PA/ARNP/Resident and agree with above documentation.  I have discussed and formulated the above plan as documented. Edits to the note have been made as needed.    Georgiana Spinner Mitzi Lilja MD [facility] [phone]   If 7pm to 7am, please call on call as listed on AMION.

## 2017-10-28 NOTE — H&P (Signed)
History and Physical    Brandon [name]:[id number] DOB: Nov 11, 1990 DOA: 10/27/2017  PCP: Devra Dopp, MD Patient coming from: home  Chief Complaint: home  HPI: Brandon [name] is a 27 y.o. male with medical history significant for hypertension, MS, noncompliance, schizophrenia, obesity, PEbeing treated with Eliquis presents to the emergency department with chief complaint persistent/worsening weakness and inability to bear weight and confusion.  Information is obtained from the chart and the patient noting that information from patient may be unreliable secondary to acute illness. Continue to chart patient was sent to the emergency department via EMS from home by his family with the chief complaint of patient is more lethargic. Chart review indicates he was recently started on Ocrevus. Mother has called the office concerned about no improvement in his condition. Chart indicates patient mother notes patient not able to stand she is concerned about being able to take care of him. Patient denies chest pain palpitation shortness of breath. Able to state his name and where he is    ED Course:  Emergency department he's afebrile hemodynamically stable and not hypoxic. Initially was mildly tachycardic he is given IV fluids  Review of Systems: As per HPI otherwise all other systems reviewed and are negative.   Ambulatory Status: t baseline patient able to ambulate with ataxia  Past Medical History:  Diagnosis Date  . ADHD (attention deficit hyperactivity disorder)   . Bipolar 1 disorder (HCC)   . Chronic back pain   . Chronic neck pain   . Hypertension   . Multiple sclerosis (HCC) 05/20/2013   left sided weakness, dysarthria  . Non-compliance   . Obesity   . Schizophrenia (HCC)   . White matter abnormality on MRI of brain 11/23/2012    Past Surgical History:  Procedure Laterality Date  . None      Social History   Socioeconomic History  . Marital status: Single    Spouse  name: Not on file  . Number of children: 0  . Years of education: 11th  . Highest education level: Not on file  Occupational History    Employer: [employer]    Comment: Disbaled  Social Needs  . Financial resource strain: Not on file  . Food insecurity:    Worry: Not on file    Inability: Not on file  . Transportation needs:    Medical: Not on file    Non-medical: Not on file  Tobacco Use  . Smoking status: Current Every Day Smoker    Packs/day: 0.25    Types: Cigarettes  . Smokeless tobacco: Never Used  . Tobacco comment: 4 cigarettes a day  Substance and Sexual Activity  . Alcohol use: Yes    Alcohol/week: 0.0 oz    Comment: "A little bit"   . Drug use: Yes    Types: Marijuana    Comment: Last used: unknown   . Sexual activity: Not on file  Lifestyle  . Physical activity:    Days per week: Not on file    Minutes per session: Not on file  . Stress: Not on file  Relationships  . Social connections:    Talks on phone: Not on file    Gets together: Not on file    Attends religious service: Not on file    Active member of club or organization: Not on file    Attends meetings of clubs or organizations: Not on file    Relationship status: Not on file  . Intimate  partner violence:    Fear of current or ex partner: Not on file    Emotionally abused: Not on file    Physically abused: Not on file    Forced sexual activity: Not on file  Other Topics Concern  . Not on file  Social History Narrative   Patient lives at home with his mother.   Disabled.   Education 11 th grade .   Right handed.   Caffeine - one cup daily soda.    No Known Allergies  Family History  Problem Relation Age of Onset  . Diabetes Mother   . ADD / ADHD Brother     Prior to Admission medications   Medication Sig Start Date End Date Taking? Authorizing Provider  amLODipine (NORVASC) 10 MG tablet Take 10 mg by mouth daily.   Yes [provider]  apixaban (ELIQUIS STARTER PACK)  5 MG TABS Take 10 mg by mouth 2 (two) times daily. 08/13/17  Yes Rama, Maryruth Bun, MD  cloZAPine (CLOZARIL) 50 MG tablet Take 0.5 tablets (25 mg total) by mouth 2 (two) times daily. Patient taking differently: Take 150 mg by mouth at bedtime.  08/13/17  Yes Rama, Maryruth Bun, MD  INVEGA SUSTENNA 234 MG/1.5ML SUSP injection Inject 234 mg into the muscle every 30 (thirty) days.  05/15/17  Yes [provider]  Melatonin 5 MG TABS Take 5 mg by mouth at bedtime.    Yes [provider]  ocrelizumab in sodium chloride 0.9 % 500 mL 300mg  IV on day one, repeat 300mg  IV on day 15. Then 600mg  IV every 6 months after that 09/02/17  Yes York Spaniel, MD  propranolol (INDERAL) 10 MG tablet Take 1 tablet (10 mg total) by mouth 2 (two) times daily. 05/03/15  Yes Lord, Herminio Heads, NP  TRAVATAN Z 0.004 % SOLN ophthalmic solution Place 1 drop into both eyes at bedtime. 10/19/17  Yes [provider]  traZODone (DESYREL) 100 MG tablet Take 200 mg by mouth at bedtime.  05/14/15  Yes [provider]    Physical Exam: Vitals:   10/28/17 0830 10/28/17 0915 10/28/17 0945 10/28/17 1045  BP: 108/90 121/69 104/78 107/62  Pulse: 88 83 88 81  Resp: 14 13 15 18   Temp:      TempSrc:      SpO2: 98% 98% 99% 98%  Weight:      Height:         General:  Appears calm and comfortable in no acute distress Eyes:  PERRL, EOMI, normal lids, iris ENT:  grossly normal hearing, lips & tongue, mucous membranes of his mouth are pink somewhat dry Neck:  no LAD, masses or thyromegaly Cardiovascular:  RRR, sounds are distant no m/r/g. No LE edema.  Respiratory:  CTA bilaterally, no w/r/r. Normal respiratory effort. Abdomen:  soft, bese positive bowel sounds albeit somewhat sluggish no guarding or rebounding Skin:  no rash or induration seen on limited exam Musculoskeletal:  Joints without swelling/erythema Psychiatric:  grossly normal mood and affect, speech fluent and appropriate, AOx3 Neurologic:   E is awake but lethargic speech is slowbut clear able to answer simple questions given enough time. Bilateral grip 4 out of 5. Creased lower extremity strength bilaterally  Labs on Admission: I have personally reviewed following labs and imaging studies  CBC: Recent Labs  Lab 10/28/17 0030  WBC 7.4  NEUTROABS 4.9  HGB 13.3  HCT 41.8  MCV 78.9  PLT 217   Basic Metabolic Panel: Recent Labs  Lab 10/28/17 0030  NA 135  K 3.7  CL 100*  CO2 23  GLUCOSE 91  BUN 10  CREATININE 1.22  CALCIUM 9.1   GFR: Estimated Creatinine Clearance: 139.7 mL/min (by C-G formula based on SCr of 1.22 mg/dL). Liver Function Tests: Recent Labs  Lab 10/28/17 0030  AST 16  ALT 27  ALKPHOS 66  BILITOT 1.0  PROT 6.7  ALBUMIN 3.8   No results for input(s): LIPASE, AMYLASE in the last 168 hours. No results for input(s): AMMONIA in the last 168 hours. Coagulation Profile: No results for input(s): INR, PROTIME in the last 168 hours. Cardiac Enzymes: No results for input(s): CKTOTAL, CKMB, CKMBINDEX, TROPONINI in the last 168 hours. BNP (last 3 results) No results for input(s): PROBNP in the last 8760 hours. HbA1C: No results for input(s): HGBA1C in the last 72 hours. CBG: Recent Labs  Lab 10/28/17 0035  GLUCAP 86   Lipid Profile: No results for input(s): CHOL, HDL, LDLCALC, TRIG, CHOLHDL, LDLDIRECT in the last 72 hours. Thyroid Function Tests: No results for input(s): TSH, T4TOTAL, FREET4, T3FREE, THYROIDAB in the last 72 hours. Anemia Panel: No results for input(s): VITAMINB12, FOLATE, FERRITIN, TIBC, IRON, RETICCTPCT in the last 72 hours. Urine analysis:    Component Value Date/Time   COLORURINE YELLOW 10/28/2017 0355   APPEARANCEUR CLEAR 10/28/2017 0355   LABSPEC 1.021 10/28/2017 0355   PHURINE 6.0 10/28/2017 0355   GLUCOSEU NEGATIVE 10/28/2017 0355   HGBUR NEGATIVE 10/28/2017 0355   HGBUR negative 01/14/2010 1451   BILIRUBINUR NEGATIVE 10/28/2017 0355   KETONESUR 20 (A)  10/28/2017 0355   PROTEINUR NEGATIVE 10/28/2017 0355   UROBILINOGEN 1.0 03/13/2015 0223   NITRITE NEGATIVE 10/28/2017 0355   LEUKOCYTESUR NEGATIVE 10/28/2017 0355    Creatinine Clearance: Estimated Creatinine Clearance: 139.7 mL/min (by C-G formula based on SCr of 1.22 mg/dL).  Sepsis Labs: @LABRCNTIP (procalcitonin:4,lacticidven:4) )No results found for this or any previous visit (from the past 240 hour(s)).   Radiological Exams on Admission: Ct Head Wo Contrast  Result Date: 10/28/2017 CLINICAL DATA:  Altered level of consciousness. Patient is more lethargic. History of multiple sclerosis. EXAM: CT HEAD WITHOUT CONTRAST TECHNIQUE: Contiguous axial images were obtained from the base of the skull through the vertex without intravenous contrast. COMPARISON:  10/22/2017 FINDINGS: Brain: Mild diffuse cerebral atrophy is prominent for patient's age. Mild ventricular dilatation consistent with central atrophy. Patchy low-attenuation changes throughout the deep white matter are nonspecific but compatible with history of multiple sclerosis. Appearances are similar to previous study. No mass-effect or midline shift. No abnormal extra-axial fluid collections. Gray-white matter junctions are distinct. Basal cisterns are not effaced. No acute intracranial hemorrhage. Vascular: No hyperdense vessel or unexpected calcification. Skull: Normal. Negative for fracture or focal lesion. Sinuses/Orbits: No acute finding. Other: None. IMPRESSION: 1. No acute intracranial abnormalities. 2. Chronic atrophy and white matter changes compatible with history of multiple sclerosis. No significant change since previous study. Electronically Signed   By: Burman Nieves M.D.   On: 10/28/2017 01:28   Mr Brain W And Wo Contrast  Result Date: 10/28/2017 CLINICAL DATA:  Larey Seat. On Eliquis for pulmonary embolism. History of multiple sclerosis. EXAM: MRI HEAD WITHOUT AND WITH CONTRAST TECHNIQUE: Multiplanar, multiecho pulse  sequences of the brain and surrounding structures were obtained without and with intravenous contrast. CONTRAST:  20mL MULTIHANCE GADOBENATE DIMEGLUMINE 529 MG/ML IV SOLN COMPARISON:  CT HEAD October 28, 2017 and MRI of the head July 30, 2017 FINDINGS: Multiple sequences are moderately motion degraded. INTRACRANIAL CONTENTS:  Confluent supratentorial white matter FLAIR T2 hyperintensities in periventricular, deep and juxta cortical distribution relatively unchanged. Lesions demonstrate low T1 signal seen with Nylan Nevel holes of demyelination. Some lesions demonstrate faint reduced diffusion. Lesions are confluent in RIGHT frontal white matter. Subcentimeter enhancement RIGHT parietal white matter. Moderate parenchymal brain volume loss. Cavum septum pellucidum et vergae. No midline shift, mass effect or masses. No abnormal extra-axial fluid collections. VASCULAR: Normal major intracranial vascular flow voids present at skull base. SKULL AND UPPER CERVICAL SPINE: No abnormal sellar expansion. No suspicious calvarial bone marrow signal. Craniocervical junction maintained. SINUSES/ORBITS: The mastoid air-cells and included paranasal sinuses are well-aerated.The included ocular globes and orbital contents are non-suspicious. OTHER: None. IMPRESSION: 1. Motion degraded examination. Severe chronic demyelination with a few hyperacute and acute demyelinating plaques. 2. Moderate parenchymal brain volume loss. Electronically Signed   By: Awilda Metro M.D.   On: 10/28/2017 06:02    EKG: Sinus rhythm ST elev, probable normal early repol pattern No significant change since last tracing  Assessment/Plan Principal Problem:   Acute encephalopathy Active Problems:   Obesity, Class III, BMI 40-49.9 (morbid obesity) (HCC)   Ataxia   HTN (hypertension)   Abnormality of gait   Multiple sclerosis (HCC)   Auditory hallucination   #1. Acute encephalopathy/ataxia/worsening generalized weakness CT head showed no  intracranial hemorrhage. MRI of his brain shows severe chronic demyelination with several hyperacute an acute demyelinating plaques. Metabolic derangements. No signs of infectious process. Neurology consulted and recommended IV Solu-Medrol and medical admission.. -admit -Frequent neuro checks -Defer management to neurology -appreciate neurology's assistant  #2.PE. Recent hospitalization for same. Currently he is not hypoxic no increased work of breathing. Home medications include Eliquis -continue home meds  3.Hypertension.fair control in the emergency department -Continue home meds  #4.schizophrenia//auditory hallucinations. Appears stable at baseline. Of note patient evaluated by psychiatry during his hospitalization 2 months ago. At that time Clazaril increased -Continue home meds  #5. Morbid obesity BMI 41 -nutritional consult    DVT prophylaxis: eliquis  Code Status: full  Family Communication: none present  Disposition Plan: .may need placement  Consults called: [facility] social word  Admission status: inpatient    Gwenyth Bender MD [facility]  If 7PM-7AM, please contact night-coverage [email or web address] Password [facility]  10/28/2017, 11:03 AM

## 2017-10-28 NOTE — ED Notes (Signed)
Dahlia Client, RN accepts report.

## 2017-10-28 NOTE — Telephone Encounter (Signed)
I called the patient.  The patient has been admitted to the hospital, he is getting IV steroids, MRI of the brain shows subacute and hyper acute MS lesions.  He has started the Ocrevus, but the delay in getting started on this medication has been costly.  The patient did not have accurate insurance information and this significantly delayed initiation of this therapy.  The patient likely will require a rehab facility come out of the hospital.

## 2017-10-29 ENCOUNTER — Telehealth: Payer: Self-pay | Admitting: Neurology

## 2017-10-29 ENCOUNTER — Inpatient Hospital Stay (HOSPITAL_COMMUNITY): Payer: Medicare Other

## 2017-10-29 DIAGNOSIS — F209 Schizophrenia, unspecified: Secondary | ICD-10-CM

## 2017-10-29 DIAGNOSIS — G4700 Insomnia, unspecified: Secondary | ICD-10-CM

## 2017-10-29 DIAGNOSIS — Z818 Family history of other mental and behavioral disorders: Secondary | ICD-10-CM

## 2017-10-29 DIAGNOSIS — F1290 Cannabis use, unspecified, uncomplicated: Secondary | ICD-10-CM

## 2017-10-29 DIAGNOSIS — F17210 Nicotine dependence, cigarettes, uncomplicated: Secondary | ICD-10-CM

## 2017-10-29 DIAGNOSIS — R4182 Altered mental status, unspecified: Secondary | ICD-10-CM

## 2017-10-29 LAB — GLUCOSE, CAPILLARY: Glucose-Capillary: 135 mg/dL — ABNORMAL HIGH (ref 65–99)

## 2017-10-29 MED ORDER — SODIUM CHLORIDE 0.9 % IV BOLUS
500.0000 mL | Freq: Once | INTRAVENOUS | Status: AC
  Administered 2017-10-29: 500 mL via INTRAVENOUS

## 2017-10-29 MED ORDER — POLYETHYLENE GLYCOL 3350 17 G PO PACK
17.0000 g | PACK | Freq: Every day | ORAL | Status: DC | PRN
  Filled 2017-10-29: qty 1

## 2017-10-29 NOTE — Progress Notes (Signed)
Called for AMS in the setting of low BP, which improved with IVF and trendelenburg positioning.   Exam: Seen in CT. His exam reveals eyes open, awake, with prominent abulia, increased latency of responses to noxious stimuli. Not responding verbally except with perseverative muttering after sternal rub. Will fixate eyes on examiner. Moves all 4 extremities equally to noxious.   CT head shows no acute change from prior.   A/R: AMS in a patient with progressive MS and schizophrenia 1. CT head is stable. 2. No focal findings on exam to suggest stroke 3. Findings on exam are most consistent with a cognitive fluctuation from his baseline of a less abulic state with comprehensible verbal output, as documented in prior exams. Prior exams have also fluctuated.  4. Continue to observe. Maintain BP.   Electronically signed: Dr. Caryl Pina

## 2017-10-29 NOTE — Progress Notes (Signed)
PROGRESS NOTE    Brandon [name]  ZOX:[id number] DOB: 06-16-1991 DOA: 10/27/2017 PCP: Devra Dopp, MD   Brief Narrative:  27 year old male with past medical history of multiple sclerosis, schizophrenia, chronic pain, ADHD, bipolar, essential hypertension into the hospital for change in mental status and weakness.  His CT of the head was negative for any acute changes with the MRI showed severe chronic demyelination with few hyperacute and acute demyelination plaques therefore he was started on steroids for acute MS flare and admitted.   Assessment & Plan:   Principal Problem:   Acute encephalopathy Active Problems:   Obesity, Class III, BMI 40-49.9 (morbid obesity) (HCC)   Ataxia   HTN (hypertension)   Abnormality of gait   Multiple sclerosis (HCC)   Auditory hallucination  Acute multiple sclerosis exacerbation History of multiple sclerosis but medically noncompliant - at this point patient is supposed be on outpatient Ocrevus but he has decided to remain noncompliant with this. -Currently on Solu-Medrol 1 g daily, day 2 - Continue to provide supportive care.  Supplemental oxygen as needed -Eventually will need PT/OT - Some underlying component of psychiatric disease is hindering  long-term understanding of his disease.  Would benefit from psych evaluation once his MS flare is low better. -UA is negative, UDS was negative, alcohol level is negative  History of pulmonary embolism -She is on Eliquis  Essential hypertension -Continue home medications- Norvasc 10mg  daily, Inderal   Schizophrenia with auditory hallucination -Continue his home meds- Clozapine  Morbid obesity with BMI greater than 41 -Nutrition consulted  DVT prophylaxis: on ELiquis Code Status: Full  Family Communication:  None at bedside  Disposition Plan: TBD  Consultants:   Neurology  Subjective: Apparently overnight patient had another episode of change in mental status and relatively low blood  pressure.  Patient was seen by neurology again and repeat CT head was negative for any acute changes.  At that point it was determined to just watch him. When I saw the patient this morning he was easily arousable and following basic commands.  He only reported of feeling generalized weakness.  Review of Systems Otherwise negative except as per HPI, including: HEENT/EYES = negative for pain, redness, loss of vision, double vision, blurred vision, loss of hearing, sore throat, hoarseness, dysphagia Cardiovascular= negative for chest pain, palpitation, murmurs, lower extremity swelling Respiratory/lungs= negative for shortness of breath, cough, hemoptysis, wheezing, mucus production Gastrointestinal= negative for nausea, vomiting,, abdominal pain, melena, hematemesis Genitourinary= negative for Dysuria, Hematuria, Change in Urinary Frequency MSK = Negative for arthralgia, myalgias, Back Pain, Joint swelling  Neurology= Negative for headache, seizures, numbness, tingling  Psychiatry= Negative for anxiety, depression, suicidal and homocidal ideation Allergy/Immunology= Medication/Food allergy as listed  Skin= Negative for Rash, lesions, ulcers, itching   Objective: Vitals:   10/29/17 0325 10/29/17 0429 10/29/17 0838 10/29/17 1225  BP: 130/80     Pulse: 65     Resp: 19     Temp:  97.7 F (36.5 C) 98.5 F (36.9 C) 98.2 F (36.8 C)  TempSrc:  Axillary Oral Oral  SpO2: 99%     Weight:      Height:        Intake/Output Summary (Last 24 hours) at 10/29/2017 1233 Last data filed at 10/29/2017 1225 Gross per 24 hour  Intake 2129.33 ml  Output 1625 ml  Net 504.33 ml   Filed Weights   10/27/17 2324  Weight: (!) 147 kg (324 lb)    Examination:  Constitutional: NAD, calm, comfortable, diaphoretic  Eyes: PERRL, lids and conjunctivae normal ENMT: Mucous membranes are moist. Posterior pharynx clear of any exudate or lesions.Normal dentition.  Neck: normal, supple, no masses, no  thyromegaly Respiratory: clear to auscultation bilaterally, no wheezing, no crackles. Normal respiratory effort. No accessory muscle use.  Cardiovascular: Regular rate and rhythm, no murmurs / rubs / gallops. No extremity edema. 2+ pedal pulses. No carotid bruits.  Abdomen: no tenderness, no masses palpated. No hepatosplenomegaly. Bowel sounds positive.  Musculoskeletal: no clubbing / cyanosis. No joint deformity upper and lower extremities. Good ROM, no contractures. Normal muscle tone.  Skin: no rashes, lesions, ulcers. No induration Neurologic: Unable to fully assess but grossly moving all the extremities and following basic commands Psychiatric: Flat affect     Data Reviewed:   CBC: Recent Labs  Lab 10/28/17 0030  WBC 7.4  NEUTROABS 4.9  HGB 13.3  HCT 41.8  MCV 78.9  PLT 217   Basic Metabolic Panel: Recent Labs  Lab 10/28/17 0030  NA 135  K 3.7  CL 100*  CO2 23  GLUCOSE 91  BUN 10  CREATININE 1.22  CALCIUM 9.1   GFR: Estimated Creatinine Clearance: 139.7 mL/min (by C-G formula based on SCr of 1.22 mg/dL). Liver Function Tests: Recent Labs  Lab 10/28/17 0030  AST 16  ALT 27  ALKPHOS 66  BILITOT 1.0  PROT 6.7  ALBUMIN 3.8   No results for input(s): LIPASE, AMYLASE in the last 168 hours. No results for input(s): AMMONIA in the last 168 hours. Coagulation Profile: No results for input(s): INR, PROTIME in the last 168 hours. Cardiac Enzymes: No results for input(s): CKTOTAL, CKMB, CKMBINDEX, TROPONINI in the last 168 hours. BNP (last 3 results) No results for input(s): PROBNP in the last 8760 hours. HbA1C: No results for input(s): HGBA1C in the last 72 hours. CBG: Recent Labs  Lab 10/28/17 0035 10/29/17 0237  GLUCAP 86 135*   Lipid Profile: No results for input(s): CHOL, HDL, LDLCALC, TRIG, CHOLHDL, LDLDIRECT in the last 72 hours. Thyroid Function Tests: No results for input(s): TSH, T4TOTAL, FREET4, T3FREE, THYROIDAB in the last 72 hours. Anemia  Panel: No results for input(s): VITAMINB12, FOLATE, FERRITIN, TIBC, IRON, RETICCTPCT in the last 72 hours. Sepsis Labs: No results for input(s): PROCALCITON, LATICACIDVEN in the last 168 hours.  No results found for this or any previous visit (from the past 240 hour(s)).       Radiology Studies: Ct Head Wo Contrast  Result Date: 10/29/2017 CLINICAL DATA:  LEFT-sided weakness, altered mental status. History of multiple sclerosis, hypertension. EXAM: CT HEAD WITHOUT CONTRAST TECHNIQUE: Contiguous axial images were obtained from the base of the skull through the vertex without intravenous contrast. COMPARISON:  MRI of the head October 28, 2016 FINDINGS: BRAIN: No intraparenchymal hemorrhage, mass effect nor midline shift. Moderate parenchymal brain volume loss. No hydrocephalus. Patchy white matter hypodensities. Cavum septum pellucidum et vergae. No acute large vascular territory infarct. No abnormal extra-axial fluid collections. VASCULAR: Unremarkable. SKULL/SOFT TISSUES: No skull fracture. Old LEFT nasal bone fracture. No significant soft tissue swelling. ORBITS/SINUSES: The included ocular globes and orbital contents are normal.The mastoid aircells and included paranasal sinuses are well-aerated. OTHER: None. IMPRESSION: 1. No acute intracranial process. 2. Stable moderate parenchymal brain volume loss and severe white matter changes previously characterized as demyelination. Electronically Signed   By: Awilda Metro M.D.   On: 10/29/2017 03:47   Ct Head Wo Contrast  Result Date: 10/28/2017 CLINICAL DATA:  Altered level of consciousness. Patient is more lethargic. History  of multiple sclerosis. EXAM: CT HEAD WITHOUT CONTRAST TECHNIQUE: Contiguous axial images were obtained from the base of the skull through the vertex without intravenous contrast. COMPARISON:  10/22/2017 FINDINGS: Brain: Mild diffuse cerebral atrophy is prominent for patient's age. Mild ventricular dilatation consistent with  central atrophy. Patchy low-attenuation changes throughout the deep white matter are nonspecific but compatible with history of multiple sclerosis. Appearances are similar to previous study. No mass-effect or midline shift. No abnormal extra-axial fluid collections. Gray-white matter junctions are distinct. Basal cisterns are not effaced. No acute intracranial hemorrhage. Vascular: No hyperdense vessel or unexpected calcification. Skull: Normal. Negative for fracture or focal lesion. Sinuses/Orbits: No acute finding. Other: None. IMPRESSION: 1. No acute intracranial abnormalities. 2. Chronic atrophy and white matter changes compatible with history of multiple sclerosis. No significant change since previous study. Electronically Signed   By: Burman Nieves M.D.   On: 10/28/2017 01:28   Mr Brain W And Wo Contrast  Result Date: 10/28/2017 CLINICAL DATA:  Larey Seat. On Eliquis for pulmonary embolism. History of multiple sclerosis. EXAM: MRI HEAD WITHOUT AND WITH CONTRAST TECHNIQUE: Multiplanar, multiecho pulse sequences of the brain and surrounding structures were obtained without and with intravenous contrast. CONTRAST:  20mL MULTIHANCE GADOBENATE DIMEGLUMINE 529 MG/ML IV SOLN COMPARISON:  CT HEAD October 28, 2017 and MRI of the head July 30, 2017 FINDINGS: Multiple sequences are moderately motion degraded. INTRACRANIAL CONTENTS: Confluent supratentorial white matter FLAIR T2 hyperintensities in periventricular, deep and juxta cortical distribution relatively unchanged. Lesions demonstrate low T1 signal seen with black holes of demyelination. Some lesions demonstrate faint reduced diffusion. Lesions are confluent in RIGHT frontal white matter. Subcentimeter enhancement RIGHT parietal white matter. Moderate parenchymal brain volume loss. Cavum septum pellucidum et vergae. No midline shift, mass effect or masses. No abnormal extra-axial fluid collections. VASCULAR: Normal major intracranial vascular flow voids present  at skull base. SKULL AND UPPER CERVICAL SPINE: No abnormal sellar expansion. No suspicious calvarial bone marrow signal. Craniocervical junction maintained. SINUSES/ORBITS: The mastoid air-cells and included paranasal sinuses are well-aerated.The included ocular globes and orbital contents are non-suspicious. OTHER: None. IMPRESSION: 1. Motion degraded examination. Severe chronic demyelination with a few hyperacute and acute demyelinating plaques. 2. Moderate parenchymal brain volume loss. Electronically Signed   By: Awilda Metro M.D.   On: 10/28/2017 06:02        Scheduled Meds: . amLODipine  10 mg Oral Daily  . apixaban  5 mg Oral BID  . cloZAPine  150 mg Oral QHS  . propranolol  10 mg Oral BID   Continuous Infusions: . sodium chloride 50 mL/hr at 10/29/17 0600  . methylPREDNISolone (SOLU-MEDROL) injection Stopped (10/28/17 1544)     LOS: 1 day    Time spent: 32 mins    Brandon Desanto Joline Maxcy, MD [facility] Pager [phone]   If 7PM-7AM, please contact night-coverage [email or web address] Password [facility] 10/29/2017, 12:33 PM

## 2017-10-29 NOTE — Progress Notes (Signed)
Propanolol obtained from pharmacy. When going to administer it was noted that pt bp was 100/51 at 0000. Bp rechecked and cuff noted to be placed irregularly so it was removed and replaced then new bp obtained at 0215  It was 70/47. PT was not able to be roused. He would open eyes but not respond to verbal stimulus. Bps continued to stay in this low range; 0216 70/47, 0217 84/50, 0220 81/48. Provider on call paged through amion. Provider contacted this nurse at 0230 and advised to give ns bolus. PT also placed in trendelenburg at this time.PT did not respond to sternal rub x3 by several nurses. Rapid response also called at this time. Provider on site came to observe pt however after Rapid RN assessed pt she advised to call neuro md on call and this doctor advised for Stat CT. PT taken to CT and images obtained. Provider advised there were no changes from previous CT and that he would not make any changes at this time and to continue to observe pt. [employer] 10/29/2017

## 2017-10-29 NOTE — Progress Notes (Signed)
Reason for consult:   Subjective: Rapid response called over night for AMS, CT head performed which showed no acute findings. Dr.Lindzen assessed patient, and exam consistent with waxing and waning of his abulic state.    He is awake today, answering questions. Very slow to respond. Food tray in front of him, but has not had anything to eat.   ROS: negative except above   Examination  Vital signs in last 24 hours: Temp:  [97.7 F (36.5 C)-98.5 F (36.9 C)] 98.2 F (36.8 C) (04/25 1225) Pulse Rate:  [64-100] 85 (04/25 1454) Resp:  [7-19] 14 (04/25 1225) BP: (70-136)/(47-104) 118/61 (04/25 1454) SpO2:  [96 %-100 %] 96 % (04/25 0838)  General: Not in distress, cooperative CVS: pulse-normal rate and rhythm RS: breathing comfortably Extremities: normal   Neuro: MS: Alert, oriented, follows commands today CN: pupils equal and reactive,  EOMI, face symmetric, tongue midline, normal sensation over face, Motor: 4/5 strength in both UE after encouragement, can raise legs antigravity after repeated encouragement Reflexes: 2+ bilaterally over patella, biceps, plantars: flexor Coordination: normal Gait: not tested  Basic Metabolic Panel: Recent Labs  Lab 10/28/17 0030  NA 135  K 3.7  CL 100*  CO2 23  GLUCOSE 91  BUN 10  CREATININE 1.22  CALCIUM 9.1    CBC: Recent Labs  Lab 10/28/17 0030  WBC 7.4  NEUTROABS 4.9  HGB 13.3  HCT 41.8  MCV 78.9  PLT 217     Coagulation Studies: No results for input(s): LABPROT, INR in the last 72 hours.  Imaging        Reviewed:     ASSESSMENT AND PLAN 27 y.o. male with a history of HTN, Obesity, severe MS, schizophrenia presents with weakness and altered mental status.  MRI showed small acute demyelinating lesions in addition to his chronic MS lesions. However, this does not explain patient's current abulic state. While he does have some weakness due to MS, it is highly effort dependent. Depression is often associated  with MS and suspect he has a major depressive episode. .   I strongly feel he will benefit from inpatient psych consultation to help address this acute depressive episode and abulic state.   Impressions Major Depressive Episode Abulia MS exacerbation   Recommendations F/U Pyschiatry recommendations  PT/OT Methylprednisone 1gx 3 days    Brandon [name] [facility] Pager Number [phone] For questions after 7pm please refer to AMION to reach the Neurologist on call

## 2017-10-29 NOTE — Progress Notes (Signed)
Nutrition Brief Note  RD consulted for diet education.  Wt Readings from Last 15 Encounters:  10/27/17 (!) 324 lb (147 kg)  10/22/17 (!) 324 lb (147 kg)  10/06/17 (!) 324 lb (147 kg)  08/11/17 (!) 313 lb (142 kg)  07/30/17 (!) 313 lb 4.4 oz (142.1 kg)  06/05/17 (!) 332 lb (150.6 kg)  05/15/17 (!) 344 lb 9.6 oz (156.3 kg)  04/03/17 (!) 360 lb 8 oz (163.5 kg)  02/10/17 (!) 351 lb (159.2 kg)  11/12/16 (!) 349 lb (158.3 kg)  10/15/16 (!) 339 lb (153.8 kg)  09/29/16 (!) 344 lb 6.4 oz (156.2 kg)  09/12/16 (!) 342 lb (155.1 kg)  08/15/16 (!) 343 lb 3.2 oz (155.7 kg)  06/20/16 (!) 344 lb 8 oz (156.3 kg)    Body mass index is 42.75 kg/m. Patient meets criteria for morbid obeisty based on current BMI.   Current diet order is regular diet, patient is consuming approximately 75-100% of meals at this time. Labs and medications reviewed. RD consulted for diet education regarding morbid obesity. Pt with acute encephalopathy, cognitive delay. Pt unable to respond to RD assessment/questions. No family at bedside. Pt not appropriate for diet education at this time. No nutrition interventions warranted at this time. If nutrition issues arise, please consult RD.   Roslyn Smiling, MS, RD, LDN Pager # [phone] After hours/ weekend pager # [phone]

## 2017-10-29 NOTE — Consult Note (Addendum)
[facility] Consult   Reason for Consult:  Apathy  Referring Physician:  Dr. Reesa Chew Patient Identification: [name] MRN:  [id number] Principal Diagnosis: Schizophrenia [name]) Diagnosis:   Patient Active Problem List   Diagnosis Date Noted  . Pulmonary embolism ([place]) [I26.99] 08/12/2017  . Hyperglycemia [R73.9] 07/31/2017  . Multiple sclerosis exacerbation ([place]) [G35] 07/30/2017  . Acute encephalopathy [G93.40] 07/30/2017  . Paranoid schizophrenia ([place]) [F20.0]   . Hallucinations [R44.3] 08/05/2014  . Auditory hallucination [R44.0]   . Left-sided weakness [R53.1] 01/18/2014  . Multiple sclerosis ([place]) [G35] 05/20/2013  . White matter abnormality on MRI of brain [R90.82] 11/23/2012  . Abnormality of gait [R26.9] 11/23/2012  . HTN (hypertension) [I10] 07/04/2011  . Ataxia [R27.0] 07/02/2011  . Obesity, Class III, BMI 40-49.9 (morbid obesity) ([place]) [E66.01] 01/14/2010  . ADHD [F90.9] 01/14/2010    Total Time spent with patient: 1 hour  Subjective:   LAWYER WASHABAUGH is a 27 y.o. male patient admitted with weakness and AMS.  HPI:   Per chart review, patient was admitted with weakness and AMS. He was found to have multiple sclerosis exacerbation with chronic demyelination on MRI and hyperacute/acute plaques. He is receiving IV steroids. His mental status has continued to deteriorate since admission and he was noted to be hypotensive to 70/47. He improved with fluid resuscitation. Home medications include Clozaril 150 mg qhs for schizophrenia. UDS was negative on admission. BAL was negative on admission.   Of note, patient was last seen by the psychiatry consult service on 2/7 for hallucinations. He endorsed hearing voices that tell him to hit himself in the head and seeing shadows at night. He was taking Clozaril 25 mg BID which was in the process of being titrated by his ACT team to 150 mg qhs. His medication was not provided while in jail. He also receives a  monthly Invega injection. His mother reported that he was safe to return home and would work closely with his ACT team.   On interview, [name] reports that he is doing okay.  He denies SI.  He reports "happy thoughts."  He also reports "happy" voices when asked about AH.  He has a history of chronic AH at baseline.  He denies AVH.  He is able to recall what he had for breakfast.  He reports that his appetite has been good.  He denies problems with sleep.  He asks questions about his medical condition.  He wonders if his MS will go away.  His mother was present at bedside with his permission.  She reports that she self-discontinued Clozaril 3 weeks ago due to sialorrhea.  She reports that his ACT team was supposed to follow up about adjusting his medications but they never did.  He is still receiving his monthly Invega injection.  He last received his injection 2 weeks ago.  He is prescribed Trazodone 100 mg qhs and Melatonin for sleep.  She reports that he has not been violent and the voices have not been causing him to hit himself in the head which usually happens when they become severe.  She reports intermittently using Clozaril when the voices become distressing.  She reports that he is near his baseline although he did appear to be lethargic a couple of days ago.  Past Psychiatric History: ADHD, schizophrenia and bipolar disorder.  Risk to Self: Is patient at risk for suicide?: No Risk to Others:  None. Denies HI.  Prior Inpatient Therapy:  He was hospitalized at  [facility] 5 years ago for psychosis.  Prior Outpatient Therapy:  He is followed at [name] and has a PSI team.   Past Medical History:  Past Medical History:  Diagnosis Date  . ADHD (attention deficit hyperactivity disorder)   . Bipolar 1 disorder ([place])   . Chronic back pain   . Chronic neck pain   . Hypertension   . Multiple sclerosis ([place]) 05/20/2013   left sided weakness, dysarthria  . Non-compliance   . Obesity   .  Schizophrenia [name])     Past Surgical History:  Procedure Laterality Date  . None     Family History:  Family History  Problem Relation Age of Onset  . Diabetes Mother   . ADD / ADHD Brother    Family Psychiatric  History: Brother-ADHD Social History:  Social History   Substance and Sexual Activity  Alcohol Use Yes  . Alcohol/week: 0.0 oz   Comment: "A little bit"      Social History   Substance and Sexual Activity  Drug Use Yes  . Types: Marijuana   Comment: Last used: unknown     Social History   Socioeconomic History  . Marital status: Single    Spouse name: Not on file  . Number of children: 0  . Years of education: 11th  . Highest education level: Not on file  Occupational History    Employer: [name] [employer]    Comment: Disbaled  Social Needs  . Financial resource strain: Not on file  . Food insecurity:    Worry: Not on file    Inability: Not on file  . Transportation needs:    Medical: Not on file    Non-medical: Not on file  Tobacco Use  . Smoking status: Current Every Day Smoker    Packs/day: 0.25    Types: Cigarettes  . Smokeless tobacco: Never Used  . Tobacco comment: 4 cigarettes a day  Substance and Sexual Activity  . Alcohol use: Yes    Alcohol/week: 0.0 oz    Comment: "A little bit"   . Drug use: Yes    Types: Marijuana    Comment: Last used: unknown   . Sexual activity: Not on file  Lifestyle  . Physical activity:    Days per week: Not on file    Minutes per session: Not on file  . Stress: Not on file  Relationships  . Social connections:    Talks on phone: Not on file    Gets together: Not on file    Attends religious service: Not on file    Active member of club or organization: Not on file    Attends meetings of clubs or organizations: Not on file    Relationship status: Not on file  Other Topics Concern  . Not on file  Social History Narrative   Patient lives at home with his mother.   Disabled.   Education 11 th grade  .   Right handed.   Caffeine - one cup daily soda.   Additional Social History: He lives at home with his mother.  He denies illicit substance or alcohol use.    Allergies:  No Known Allergies  Labs:  Results for orders placed or performed during the [name] encounter of 10/27/17 (from the past 48 hour(s))  CBC with Differential     Status: Abnormal   Collection Time: 10/28/17 12:30 AM  Result Value Ref Range   WBC 7.4 4.0 - 10.5 K/uL   RBC  5.30 4.22 - 5.81 MIL/uL   Hemoglobin 13.3 13.0 - 17.0 g/dL   HCT 41.8 39.0 - 52.0 %   MCV 78.9 78.0 - 100.0 fL   MCH 25.1 (L) 26.0 - 34.0 pg   MCHC 31.8 30.0 - 36.0 g/dL   RDW 14.8 11.5 - 15.5 %   Platelets 217 150 - 400 K/uL   Neutrophils Relative % 66 %   Neutro Abs 4.9 1.7 - 7.7 K/uL   Lymphocytes Relative 22 %   Lymphs Abs 1.6 0.7 - 4.0 K/uL   Monocytes Relative 10 %   Monocytes Absolute 0.8 0.1 - 1.0 K/uL   Eosinophils Relative 2 %   Eosinophils Absolute 0.1 0.0 - 0.7 K/uL   Basophils Relative 0 %   Basophils Absolute 0.0 0.0 - 0.1 K/uL    Comment: Performed at [facility] [address], [place], [place] [address]  Comprehensive metabolic panel     Status: Abnormal   Collection Time: 10/28/17 12:30 AM  Result Value Ref Range   Sodium 135 135 - 145 mmol/L   Potassium 3.7 3.5 - 5.1 mmol/L   Chloride 100 (L) 101 - 111 mmol/L   CO2 23 22 - 32 mmol/L   Glucose, Bld 91 65 - 99 mg/dL   BUN 10 6 - 20 mg/dL   Creatinine, Ser 1.22 0.61 - 1.24 mg/dL   Calcium 9.1 8.9 - 10.3 mg/dL   Total Protein 6.7 6.5 - 8.1 g/dL   Albumin 3.8 3.5 - 5.0 g/dL   AST 16 15 - 41 U/L   ALT 27 17 - 63 U/L   Alkaline Phosphatase 66 38 - 126 U/L   Total Bilirubin 1.0 0.3 - 1.2 mg/dL   GFR calc non Af Amer >60 >60 mL/min   GFR calc Af Amer >60 >60 mL/min    Comment: (NOTE) The eGFR has been calculated using the CKD EPI equation. This calculation has not been validated in all clinical situations. eGFR's persistently <60 mL/min signify possible  Chronic Kidney Disease.    Anion gap 12 5 - 15    Comment: Performed at [facility] [address] [place], [place] [address]  Ethanol     Status: None   Collection Time: 10/28/17 12:30 AM  Result Value Ref Range   Alcohol, Ethyl (B) <10 <10 mg/dL    Comment:        LOWEST DETECTABLE LIMIT FOR SERUM ALCOHOL IS 10 mg/dL FOR MEDICAL PURPOSES ONLY Performed at [name], [employer] [employer] [address], [place], [place] [address]   CBG monitoring, ED     Status: None   Collection Time: 10/28/17 12:35 AM  Result Value Ref Range   Glucose-Capillary 86 65 - 99 mg/dL  Urinalysis, Routine w reflex microscopic     Status: Abnormal   Collection Time: 10/28/17  3:55 AM  Result Value Ref Range   Color, Urine YELLOW YELLOW   APPearance CLEAR CLEAR   Specific Gravity, Urine 1.021 1.005 - 1.030   pH 6.0 5.0 - 8.0   Glucose, UA NEGATIVE NEGATIVE mg/dL   Hgb urine dipstick NEGATIVE NEGATIVE   Bilirubin Urine NEGATIVE NEGATIVE   Ketones, ur 20 (A) NEGATIVE mg/dL   Protein, ur NEGATIVE NEGATIVE mg/dL   Nitrite NEGATIVE NEGATIVE   Leukocytes, UA NEGATIVE NEGATIVE    Comment: Performed at [facility] [address] [place], [place] [address]  Rapid urine drug screen ([name] performed)     Status: None  Collection Time: 10/28/17  3:55 AM  Result Value Ref Range   Opiates NONE DETECTED NONE DETECTED   Cocaine NONE DETECTED NONE DETECTED   Benzodiazepines NONE DETECTED NONE DETECTED   Amphetamines NONE DETECTED NONE DETECTED   Tetrahydrocannabinol NONE DETECTED NONE DETECTED   Barbiturates NONE DETECTED NONE DETECTED    Comment: (NOTE) DRUG SCREEN FOR MEDICAL PURPOSES ONLY.  IF CONFIRMATION IS NEEDED FOR ANY PURPOSE, NOTIFY LAB WITHIN 5 DAYS. LOWEST DETECTABLE LIMITS FOR URINE DRUG SCREEN Drug Class                     Cutoff (ng/mL) Amphetamine and metabolites    1000 Barbiturate and metabolites    200 Benzodiazepine                 502 Tricyclics and metabolites      300 Opiates and metabolites        300 Cocaine and metabolites        300 THC                            50 Performed at [name], [place] [address] [place], [place] [address]   Glucose, capillary     Status: Abnormal   Collection Time: 10/29/17  2:37 AM  Result Value Ref Range   Glucose-Capillary 135 (H) 65 - 99 mg/dL    Current Facility-Administered Medications  Medication Dose Route Frequency Provider Last Rate Last Dose  . 0.9 %  sodium chloride infusion   Intravenous Continuous Radene Gunning, NP 50 mL/hr at 10/29/17 0600    . acetaminophen (TYLENOL) tablet 650 mg  650 mg Oral Q6H PRN Radene Gunning, NP       Or  . acetaminophen (TYLENOL) suppository 650 mg  650 mg Rectal Q6H PRN Radene Gunning, NP      . amLODipine (NORVASC) tablet 10 mg  10 mg Oral Daily Radene Gunning, NP   10 mg at 10/28/17 1115  . apixaban (ELIQUIS) tablet 5 mg  5 mg Oral BID Gardiner Barefoot, NP   5 mg at 10/28/17 2313  . cloZAPine (CLOZARIL) tablet 150 mg  150 mg Oral QHS Black, Lezlie Octave, NP      . methylPREDNISolone sodium succinate (SOLU-MEDROL) 1,000 mg in sodium chloride 0.9 % 50 mL IVPB  1,000 mg Intravenous Daily Karmen Bongo, MD   Stopped at 10/28/17 1544  . ondansetron (ZOFRAN) tablet 4 mg  4 mg Oral Q6H PRN Radene Gunning, NP       Or  . ondansetron [name]) injection 4 mg  4 mg Intravenous Q6H PRN Radene Gunning, NP      . propranolol (INDERAL) tablet 10 mg  10 mg Oral BID Radene Gunning, NP   10 mg at 10/28/17 1318  . senna-docusate (Senokot-S) tablet 1 tablet  1 tablet Oral QHS PRN Radene Gunning, NP      . traZODone (DESYREL) tablet 200 mg  200 mg Oral QHS PRN Black, Lezlie Octave, NP        Musculoskeletal: Strength & Muscle Tone: within normal limits Gait & Station: UTA since patient was sitting in a chair.  Patient leans: N/A  Psychiatric Specialty Exam: Physical Exam  Nursing note and vitals reviewed. Constitutional: He appears well-developed and well-nourished.  HENT:   Head: Normocephalic and atraumatic.  Neck: Normal range of motion.  Respiratory: Effort normal.  Musculoskeletal: Normal  range of motion.  Neurological: He is alert.  Skin: No erythema.  Psychiatric: He has a normal mood and affect. Judgment and thought content normal. His speech is slurred. He is slowed. Cognition and memory are impaired.    Review of Systems  Psychiatric/Behavioral: Positive for hallucinations (chronic AH). Negative for suicidal ideas. The patient does not have insomnia.   All other systems reviewed and are negative.   Blood pressure 130/80, pulse 65, temperature 98.5 F (36.9 C), temperature source Oral, resp. rate 19, height 6' 1"  (1.854 m), weight (!) 147 kg (324 lb), SpO2 99 %.Body mass index is 42.75 kg/m.  General Appearance: Fairly Groomed, young, African American male who is sitting in a chair with his legs elevated. NAD.  Eye Contact:  Good  Speech:  Slow and Slurred  Volume:  Decreased  Mood:  "Okay"  Affect:  Constricted  Thought Process:  Goal Directed, Linear and Descriptions of Associations: Intact  Orientation:  Other:  Oriented to person and place.  Thought Content:  Logical  Suicidal Thoughts:  No  Homicidal Thoughts:  No  Memory:  Immediate;   Fair Recent;   Fair Remote;   Fair  Judgement:  Impaired  Insight:  Shallow  Psychomotor Activity:  Normal  Concentration:  Concentration: Fair and Attention Span: Fair  Recall:  [employer] of Knowledge:  Poor  Language:  Fair  Akathisia:  No  Handed:  Right  AIMS (if indicated):   N/A  Assets:  Housing Social Support  ADL's:  Intact  Cognition:  Impaired due to mental illness.  Sleep:   Okay   Assessment:  MANDEEP [name] is a 28 y.o. male who was admitted with weakness and AMS in the setting of multiple sclerosis exacerbation. Patient appears to be at his baseline compared to the last time he was seen by psychiatry in February. His mother agrees that he is near his baseline as well although  he was lethargic a couple days ago which is likely secondary to medical illness. He denies SI, HI or VH. He endorses chronic AH that are not distressing and are not command in nature. He is no longer receiving Clozaril due to side effects. His mother plans to work with his ACT team for further medication management. He does not warrant inpatient psychiatric hospitalization at this time.   Treatment Plan Summary: -Continue home medications: Monthly Invega 234 mg injection (due in 2 weeks), Trazodone 100 mg qhs for insomnia and OTC Melatonin for insomnia.  -Patient will follow up with his ACT team for further medication management.  -Patient is psychiatrically cleared. Psychiatry will sign off on patient at this time. Please consult psychiatry again as needed.   Disposition: No evidence of imminent risk to self or others at present.   Patient does not meet criteria for psychiatric inpatient admission.  Faythe Dingwall, DO 10/29/2017 11:38 AM

## 2017-10-29 NOTE — Progress Notes (Signed)
Pt with acute change in mental status. Very lethargic, non verbal, not following commands which is different that last time RN checked on pt. BP soft which is new. Bolus ordered. Pt placed in trendelenburg and BP up to 119. BS 137. NP called colleague at [facility] to go and see the pt.  KJKG, NP [facility]

## 2017-10-29 NOTE — Telephone Encounter (Signed)
Maurine Minister PT called to inform Dr. Anne Hahn that the pt is current in the hospital as of 4/23. Also Maurine Minister states that he hasn't been able to see the pt yet for physical therapy. For any questions Maurine Minister can be reached at [phone]

## 2017-10-29 NOTE — Significant Event (Signed)
Rapid Response Event Note  Overview: Neurologic - AMS/Decreased LOC  Initial Focused Assessment: Called by RN about patient's decrease in LOC, per RN, patient was more awake earlier, able to have a conversation, take pills, and was responsive (delayed but responeded). Per RN, baseline patient has left sided weakness but upon assessment now, patient has right sided weakness and patient was very hard to arouse and keep awake.  RN informed me that, patient's SBP was in the 80s, fluid bolus was ordered prior to my arrival. Blood sugar was checked and it was greater than 100. When I arrived, patient was somnolent, after a few minutes and multiple attempts, patient did respond noxious stimuli, localized to pain in all extremities except that RUE. + Gag, + CR, and + cough, pupils brisk and reactive. [facility] NP was already notified. Neuro MD on call was paged as well, per RN, LSN at 2230. BP improved after fluids, VSS. Patient was admitted for MS exacerbation, recently had a fall, and was placed on Eliquis for a PE. Patient also has a history of schizophrenia.  Interventions: -- STAT CT HEAD - unchanged from yesterday's exam.  Plan of Care: -- Per Neuro MD, monitor patient for now -- Instructed RN to do neuro checks and keep HOB > 30 (aspiration precautions)  Event Summary:   at    Call Time 243 Arrival Time 248 End Time 0400  [name] R

## 2017-10-29 NOTE — Evaluation (Signed)
Physical Therapy Evaluation Patient Details Name: [name] MRN: [id number] DOB: [birth year] Today's Date: 10/29/2017   History of Present Illness  27 year old male with past medical history of multiple sclerosis, schizophrenia, chronic pain, ADHD, bipolar, essential hypertension into the hospital for change in mental status and weakness.  His CT of the head was negative for any acute changes with the MRI showed severe chronic demyelination with few hyperacute and acute demyelination plaques therefore he was started on steroids for acute MS flare   Clinical Impression  Pt with flat affect responding to some questions and stating he thought it is April 6. Pt with delayed response to questions and commands with generalized weakness, impaired balance, decreased ability with transfers with assist for all mobility who will benefit from acute therapy to maximize mobility, safety, function and independence to decrease burden of care. Per mom family cannot provide 24hr assist and pt has had rapid decline over the last month from walking to only transferring to [facility] with assist for ADLs. Recommend OOB daily with bari steady and will continue to follow. Pt with condom cath off on arrival, pt soaked in urine and unaware with assist for linen change.      Follow Up Recommendations SNF;Supervision/Assistance - 24 hour    Equipment Recommendations  None recommended by PT    Recommendations for Other Services       Precautions / Restrictions Precautions Precautions: Fall      Mobility  Bed Mobility Overal bed mobility: Needs Assistance Bed Mobility: Supine to Sit     Supine to sit: Mod assist;+2 for physical assistance     General bed mobility comments: assist to bring legs fully to EOB and elevate trunk, increased time  Transfers Overall transfer level: Needs assistance   Transfers: Sit to/from Stand Sit to Stand: Mod assist;+2 physical assistance         General transfer comment:  pt initially unable to stand from surface but over 3 trials stood x 2. Used bari stedy to stand and pivot to recliner with min +2 assist   Ambulation/Gait             General Gait Details: unable   Stairs            Wheelchair Mobility    Modified Rankin (Stroke Patients Only)       Balance Overall balance assessment: Needs assistance;History of Falls   Sitting balance-Leahy Scale: Poor Sitting balance - Comments: right anterior lean with variance to posterior lean with feet off floor     Standing balance-Leahy Scale: Poor Standing balance comment: reliant on external support; maintains standing short period of time; difficulty achieveing full upright standing.                             Pertinent Vitals/Pain Pain Assessment: No/denies pain Faces Pain Scale: No hurt    Home Living Family/patient expects to be discharged to:: Skilled nursing facility Living Arrangements: Parent Available Help at Discharge: Family;Available PRN/intermittently Type of Home: House Home Access: Ramped entrance     Home Layout: One level Home Equipment: Walker - 4 wheels;Wheelchair - manual;Hospital bed;Tub bench Additional Comments: mom, brother and uncle    Prior Function Level of Independence: Needs assistance   Gait / Transfers Assistance Needed: stand and transfer to [facility], frequent falls and has not been walking for about a month  ADL's / Homemaking Assistance Needed: he does not cook, assist for bathing and  dressing  Comments: mom present after session to provide home setup and function     Hand Dominance   Dominant Hand: Right    Extremity/Trunk Assessment   Upper Extremity Assessment Upper Extremity Assessment: Defer to OT evaluation    Lower Extremity Assessment Lower Extremity Assessment: Generalized weakness    Cervical / Trunk Assessment Cervical / Trunk Assessment: Other exceptions Cervical / Trunk Exceptions: posterior bias; post pelvic  tilt  Communication   Communication: Expressive difficulties(soft spoken and not always intelligible)  Cognition Arousal/Alertness: Lethargic Behavior During Therapy: Flat affect Overall Cognitive Status: Impaired/Different from baseline Area of Impairment: Orientation;Attention;Following commands;Safety/judgement;Awareness;Problem solving                 Orientation Level: Disoriented to;Time;Situation Current Attention Level: Sustained   Following Commands: Follows one step commands inconsistently;Follows one step commands with increased time Safety/Judgement: Decreased awareness of safety;Decreased awareness of deficits Awareness: Intellectual Problem Solving: Slow processing;Decreased initiation;Difficulty sequencing;Requires verbal cues;Requires tactile cues General Comments: pt with slow responses to questions and commands inconsistently, pt speech is soft spoken and difficult to understand at time. Per mom cognition not baseline and that he has been hearing voices      General Comments      Exercises     Assessment/Plan    PT Assessment Patient needs continued PT services  PT Problem List Decreased strength;Decreased mobility;Decreased safety awareness;Decreased range of motion;Decreased coordination;Decreased cognition;Decreased activity tolerance;Decreased balance       PT Treatment Interventions Gait training;Therapeutic exercise;Patient/family education;Functional mobility training;Balance training;DME instruction;Therapeutic activities;Cognitive remediation    PT Goals (Current goals can be found in the Care Plan section)  Acute Rehab PT Goals Patient Stated Goal: return to walking PT Goal Formulation: With family Time For Goal Achievement: 11/12/17 Potential to Achieve Goals: Fair    Frequency Min 3X/week   Barriers to discharge Decreased caregiver support      Co-evaluation PT/OT/SLP Co-Evaluation/Treatment: Yes Reason for Co-Treatment: Complexity  of the patient's impairments (multi-system involvement);For patient/therapist safety;Necessary to address cognition/behavior during functional activity PT goals addressed during session: Mobility/safety with mobility;Balance OT goals addressed during session: ADL's and self-care       AM-PAC PT "6 Clicks" Daily Activity  Outcome Measure Difficulty turning over in bed (including adjusting bedclothes, sheets and blankets)?: A Little Difficulty moving from lying on back to sitting on the side of the bed? : A Lot Difficulty sitting down on and standing up from a chair with arms (e.g., wheelchair, bedside commode, etc,.)?: Unable Help needed moving to and from a bed to chair (including a wheelchair)?: A Lot Help needed walking in hospital room?: Total Help needed climbing 3-5 steps with a railing? : Total 6 Click Score: 10    End of Session Equipment Utilized During Treatment: Gait belt Activity Tolerance: Patient tolerated treatment well Patient left: in chair;with call bell/phone within reach;with chair alarm set Nurse Communication: Mobility status;Need for lift equipment PT Visit Diagnosis: Other abnormalities of gait and mobility (R26.89);Muscle weakness (generalized) (M62.81);History of falling (Z91.81)    Time: 0867-6195 PT Time Calculation (min) (ACUTE ONLY): 28 min   Charges:   PT Evaluation $PT Eval Moderate Complexity: 1 Mod     PT G Codes:        Delaney Meigs, PT [phone]   Aadvik Roker B Hodaya Curto 10/29/2017, 3:10 PM

## 2017-10-29 NOTE — Progress Notes (Signed)
Occupational Therapy Evaluation Patient Details Name: FRANCHOT POLLITT MRN: [id number] DOB: March 07, 1991 Today's Date: 10/29/2017    History of Present Illness 27 year old male with past medical history of multiple sclerosis, schizophrenia, chronic pain, ADHD, bipolar, essential hypertension into the hospital for change in mental status and weakness.  His CT of the head was negative for any acute changes with the MRI showed severe chronic demyelination with few hyperacute and acute demyelination plaques therefore he was started on steroids for acute MS flare    Clinical Impression   PTA, pt lived at home with family and was able to transfer to his w/c and help with his ADL tasks. Mom states he has had a significant functional decline in the past month and has had multiple falls requiring call to EMS to help get pt off floor. Pt has required greater assistance with ADL tasks as well. At this time, pt required Max A +2 with mobility and was unable to take pivotal steps to chair, requiring use of Stedy. Pt max to total A with all ADL tasks. At this time, recommend rehab at SNF to maximize functional level of independence and facilitate safe DC home. Mom in agreement with rehab at SNF. Will follow acutely to address established goals.     Follow Up Recommendations  SNF;Supervision/Assistance - 24 hour    Equipment Recommendations  None recommended by OT    Recommendations for Other Services       Precautions / Restrictions Precautions Precautions: Fall      Mobility Bed Mobility Overal bed mobility: Needs Assistance Bed Mobility: Supine to Sit     Supine to sit: Max assist;+2 for physical assistance        Transfers Overall transfer level: Needs assistance   Transfers: Sit to/from Stand Sit to Stand: +2 physical assistance;Max assist         General transfer comment: unable to take pivotal steps to chair; used Stedy for safety    Balance Overall balance assessment: Needs  assistance;History of Falls   Sitting balance-Leahy Scale: Poor Sitting balance - Comments: posterio lean; R lean' anterior lean - depending on pt position; affected by attentional deficits     Standing balance-Leahy Scale: Poor Standing balance comment: reliant on external support; maintains standing short period of time; difficulty achieveing full upright standing.                           ADL either performed or assessed with clinical judgement   ADL Overall ADL's : Needs assistance/impaired Eating/Feeding: Moderate assistance Eating/Feeding Details (indicate cue type and reason): requires spoon to be loaded then pt able to bring food to mouth Grooming: Maximal assistance;Sitting   Upper Body Bathing: Maximal assistance;Sitting   Lower Body Bathing: Total assistance;Sit to/from stand   Upper Body Dressing : Total assistance   Lower Body Dressing: Total assistance   Toilet Transfer: Maximal assistance;+2 for physical assistance   Toileting- Clothing Manipulation and Hygiene: Total assistance Toileting - Clothing Manipulation Details (indicate cue type and reason): pt incontinenet; this is baseline     Functional mobility during ADLs: Maximal assistance;+2 for physical assistance General ADL Comments: poor attention affecting participation with ADL     Vision Baseline Vision/History: Wears glasses Wears Glasses: At all times Additional Comments: Mom states pt has "pressure" in his R eye and is "losing his sight"     Perception     Praxis      Pertinent Vitals/Pain Pain Assessment:  Faces Faces Pain Scale: No hurt     Hand Dominance Right   Extremity/Trunk Assessment Upper Extremity Assessment Upper Extremity Assessment: Generalized weakness(inconsistent use of BUE)  Pt able to use BUE spontaneously (I.e. To assist with feeding self). Pt with minimal movement of BUE on command. Will further assess   Lower Extremity Assessment Lower Extremity  Assessment: Defer to PT evaluation   Cervical / Trunk Assessment Cervical / Trunk Assessment: Other exceptions Cervical / Trunk Exceptions: posterior bias; post pelvic tilt   Communication Communication Communication: Expressive difficulties(soft spoken and not always intelligible)   Cognition Arousal/Alertness: Lethargic Behavior During Therapy: Flat affect Overall Cognitive Status: Impaired/Different from baseline Area of Impairment: Orientation;Attention;Following commands;Safety/judgement;Awareness;Problem solving                 Orientation Level: Disoriented to;Time;Situation Current Attention Level: Sustained   Following Commands: Follows one step commands inconsistently Safety/Judgement: Decreased awareness of safety;Decreased awareness of deficits Awareness: Intellectual Problem Solving: Slow processing;Decreased initiation;Difficulty sequencing;Requires verbal cues;Requires tactile cues General Comments: Mom staes Davidson is quiet but that his cognition is not at basleine. She states "his schitzophrenia may also be a part of thi". States Zyad often talks to the voices he hears and that this has been worse over the last month   General Comments       Exercises     Shoulder Instructions      Home Living Family/patient expects to be discharged to:: Skilled nursing facility Living Arrangements: Parent Available Help at Discharge: Family;Available PRN/intermittently Type of Home: House Home Access: Ramped entrance     Home Layout: One level     Bathroom Shower/Tub: Chief Strategy Officer: Standard     Home Equipment: Environmental consultant - 4 wheels;Wheelchair - manual;Hospital bed;Tub bench   Additional Comments: mom, brother and uncle      Prior Functioning/Environment Level of Independence: Needs assistance  Gait / Transfers Assistance Needed: stand and transfer to [facility], frequent falls and has not been walking for about a month ADL's / Homemaking  Assistance Needed: he does not cook, assist for bathing and dressing   Comments: mom present after session to provide home setup and function        OT Problem List: Decreased strength;Decreased range of motion;Decreased activity tolerance;Impaired balance (sitting and/or standing);Impaired vision/perception;Decreased coordination;Decreased cognition;Decreased safety awareness;Decreased knowledge of use of DME or AE;Decreased knowledge of precautions;Obesity;Impaired tone;Impaired UE functional use      OT Treatment/Interventions: Self-care/ADL training;Therapeutic exercise;Neuromuscular education;DME and/or AE instruction;Therapeutic activities;Cognitive remediation/compensation;Visual/perceptual remediation/compensation;Patient/family education;Balance training    OT Goals(Current goals can be found in the care plan section) Acute Rehab OT Goals Patient Stated Goal: return to walking OT Goal Formulation: With patient/family Time For Goal Achievement: 11/12/17 Potential to Achieve Goals: Good  OT Frequency: Min 2X/week   Barriers to D/C:            Co-evaluation PT/OT/SLP Co-Evaluation/Treatment: Yes Reason for Co-Treatment: Complexity of the patient's impairments (multi-system involvement);Necessary to address cognition/behavior during functional activity;To address functional/ADL transfers   OT goals addressed during session: ADL's and self-care      AM-PAC PT "6 Clicks" Daily Activity     Outcome Measure Help from another person eating meals?: A Lot Help from another person taking care of personal grooming?: A Lot Help from another person toileting, which includes using toliet, bedpan, or urinal?: Total Help from another person bathing (including washing, rinsing, drying)?: A Lot Help from another person to put on and taking off regular upper body clothing?: A  Lot Help from another person to put on and taking off regular lower body clothing?: Total 6 Click Score: 10   End  of Session Equipment Utilized During Treatment: Gait belt;Other (comment)(Stedy) Nurse Communication: Mobility status;Need for lift equipment  Activity Tolerance: Patient tolerated treatment well Patient left: in chair;with call bell/phone within reach;with chair alarm set;with family/visitor present;with nursing/sitter in room  OT Visit Diagnosis: Other abnormalities of gait and mobility (R26.89);Repeated falls (R29.6);Muscle weakness (generalized) (M62.81);Low vision, both eyes (H54.2);Other symptoms and signs involving cognitive function                Time: 1310-1338 OT Time Calculation (min): 28 min Charges:  OT General Charges $OT Visit: 1 Visit OT Evaluation $OT Eval Moderate Complexity: 1 Mod G-Codes:     [facility], OT/L  [phone] 10/29/2017  Jamauri Kruzel,H[place] 10/29/2017, 2:27 PM

## 2017-10-30 DIAGNOSIS — F201 Disorganized schizophrenia: Secondary | ICD-10-CM

## 2017-10-30 DIAGNOSIS — R269 Unspecified abnormalities of gait and mobility: Secondary | ICD-10-CM

## 2017-10-30 DIAGNOSIS — R270 Ataxia, unspecified: Secondary | ICD-10-CM

## 2017-10-30 MED ORDER — POLYETHYLENE GLYCOL 3350 17 G PO PACK
17.0000 g | PACK | Freq: Two times a day (BID) | ORAL | Status: DC
  Administered 2017-10-30 – 2017-11-02 (×6): 17 g via ORAL
  Filled 2017-10-30 (×6): qty 1

## 2017-10-30 MED ORDER — SODIUM CHLORIDE 0.9 % IV SOLN
1000.0000 mg | Freq: Once | INTRAVENOUS | Status: AC
  Administered 2017-10-30: 1000 mg via INTRAVENOUS
  Filled 2017-10-30: qty 8

## 2017-10-30 MED ORDER — BISACODYL 10 MG RE SUPP
10.0000 mg | Freq: Once | RECTAL | Status: AC
  Administered 2017-10-30: 10 mg via RECTAL
  Filled 2017-10-30: qty 1

## 2017-10-30 NOTE — NC FL2 (Signed)
MEDICAID FL2 LEVEL OF CARE SCREENING TOOL     IDENTIFICATION  Patient Name: Brandon [name] Birthdate: 1991/03/13 Sex: male Admission Date (Current Location): 10/27/2017  [place] and [place] Number:  Haynes Bast [id number] M Facility and Address:  [facility] [place], [address], [place], [place] [address]      Provider Number: [id number]  Attending Physician Name and Address:  Richarda Overlie, MD  Relative Name and Phone Number:  Kazim Corrales; [phone]    Current Level of Care: Hospital Recommended Level of Care: Skilled Nursing Facility Prior Approval Number:    Date Approved/Denied:   PASRR Number: pending  Discharge Plan: SNF    Current Diagnoses: Patient Active Problem List   Diagnosis Date Noted  . Pulmonary embolism (HCC) 08/12/2017  . Hyperglycemia 07/31/2017  . Multiple sclerosis exacerbation (HCC) 07/30/2017  . Acute encephalopathy 07/30/2017  . Schizophrenia (HCC)   . Hallucinations 08/05/2014  . Auditory hallucination   . Left-sided weakness 01/18/2014  . Multiple sclerosis (HCC) 05/20/2013  . 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 Abnormality of gait 11/23/2012  . HTN (hypertension) 07/04/2011  . 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 Obesity, Class III, BMI 40-49.9 (morbid obesity) (HCC) 01/14/2010  . ADHD 01/14/2010    Orientation RESPIRATION BLADDER Height & Weight     Self, Place  Normal Incontinent, External catheter Weight: (!) 324 lb (147 kg) Height:  6\' 1"  (185.4 cm)  BEHAVIORAL SYMPTOMS/MOOD NEUROLOGICAL BOWEL NUTRITION STATUS      Continent Diet(see discharge summary)  AMBULATORY STATUS COMMUNICATION OF NEEDS Skin   Extensive Assist Verbally Normal                       Personal Care Assistance Level of Assistance  Bathing, Feeding, Dressing Bathing Assistance: Maximum assistance Feeding assistance: Limited assistance Dressing Assistance: Maximum assistance     Functional Limitations Info   Sight, Hearing, Speech Sight Info: Adequate Hearing Info: Adequate Speech Info: Impaired    SPECIAL CARE FACTORS FREQUENCY  PT (By licensed PT), OT (By licensed OT)     PT Frequency: 5x week OT Frequency: 5x week            Contractures Contractures Info: Present(hands have slight contractures)    Additional Factors Info  Code Status, Allergies, Psychotropic Code Status Info: Full Code Allergies Info: No Known Allergies Psychotropic Info: propranolol (INDERAL) tablet 10 mg po 2x daily         Current Medications (10/30/2017):  This is the current hospital active medication list Current Facility-Administered Medications  Medication Dose Route Frequency Provider Last Rate Last Dose  . acetaminophen (TYLENOL) tablet 650 mg  650 mg Oral Q6H PRN Gwenyth Bender, NP   650 mg at 10/29/17 1301   Or  . acetaminophen (TYLENOL) suppository 650 mg  650 mg Rectal Q6H PRN Gwenyth Bender, NP      . amLODipine (NORVASC) tablet 10 mg  10 mg Oral Daily Gwenyth Bender, NP   10 mg at 10/30/17 0905  . apixaban (ELIQUIS) tablet 5 mg  5 mg Oral BID Leda Gauze, NP   5 mg at 10/30/17 0905  . bisacodyl (DULCOLAX) suppository 10 mg  10 mg Rectal Once Richarda Overlie, MD      . ondansetron (ZOFRAN) tablet 4 mg  4 mg Oral Q6H PRN Gwenyth Bender, NP       Or  . ondansetron [facility]) injection 4 mg  4 mg Intravenous Q6H  PRN Gwenyth Bender, NP      . polyethylene glycol (MIRALAX / GLYCOLAX) packet 17 g  17 g Oral Daily PRN Bodenheimer, Charles A, NP      . polyethylene glycol (MIRALAX / GLYCOLAX) packet 17 g  17 g Oral BID Richarda Overlie, MD      . propranolol (INDERAL) tablet 10 mg  10 mg Oral BID Gwenyth Bender, NP   10 mg at 10/30/17 0905  . senna-docusate (Senokot-S) tablet 1 tablet  1 tablet Oral QHS PRN Gwenyth Bender, NP      . traZODone (DESYREL) tablet 200 mg  200 mg Oral QHS PRN Gwenyth Bender, NP   200 mg at 10/29/17 2353     Discharge Medications: Please see discharge summary for a  list of discharge medications.  Relevant Imaging Results:  Relevant Lab Results:   Additional Information SS# [id number]  Doy Hutching, [place]

## 2017-10-30 NOTE — Social Work (Addendum)
Home Phone [phone] Brandon [name]. Is also able to be given information at home per pt mother.  See full assessment note for more details.  Doy Hutching, LCSWA [facility] Clinical Social Work [phone]

## 2017-10-30 NOTE — Progress Notes (Signed)
Tried to call report to 3W three times in the past hour and the RN has been to busy to take it.  Will try again in 10 minutes.

## 2017-10-30 NOTE — Progress Notes (Signed)
Bedside report performed. Agree with prior assessment as documented.

## 2017-10-30 NOTE — Plan of Care (Signed)

## 2017-10-30 NOTE — Progress Notes (Addendum)
PROGRESS NOTE    Brandon [name]  ZOX:[id number] DOB: Apr 09, 1991 DOA: 10/27/2017 PCP: Devra Dopp, MD   Brief Narrative:  27 year old male with past medical history of multiple sclerosis, schizophrenia, chronic pain, ADHD, bipolar, essential hypertension into the hospital for change in mental status and weakness.  His CT of the head was negative for any acute changes with the MRI showed severe chronic demyelination with few hyperacute and acute demyelination plaques therefore he was started on steroids for acute MS flare and admitted. Patient being followed by neurology. Neurology recommended psychiatric consultation for patient's acute depressive episodes and abulic state.   Assessment & Plan:   Principal Problem:   Schizophrenia (HCC) Active Problems:   Obesity, Class III, BMI 40-49.9 (morbid obesity) (HCC)   Ataxia   HTN (hypertension)   Abnormality of gait   Multiple sclerosis (HCC)   Auditory hallucination   Acute encephalopathy   Acute multiple sclerosis exacerbation History of multiple sclerosis but medically noncompliant - at this point patient is supposed be on outpatient Ocrevus but he has decided to remain noncompliant with this. -Currently on Solu-Medrol 1 g daily, day 3,which is his last dose per neuro -Continue to provide supportive care.  - PT OT recommendation is to place patient in SNF, social worker consult placed - Some underlying component of psychiatric disease is hindering  long-term understanding of his disease. Psychiatry consulted , appears to be at his baseline compared to the last time he was seen by psychiatry in February, follow up with his ACT team for further medication management -UA is negative, UDS was negative, alcohol level is negative   History of pulmonary embolism - he is on Eliquis  Essential hypertension -Continue home medications- Norvasc 10mg  daily, Inderal   Schizophrenia with auditory hallucination -Continue his home meds-  Clozapine  Morbid obesity with BMI greater than 41 -Nutrition consulted  DVT prophylaxis: on ELiquis Code Status: Full  Family Communication:  None at bedside  Disposition Plan: needs SNF when bed available, needs [facility] due to psyche hx   Consultants:   Neurology  Psychiatry  Subjective: Constipated, mother at bedside requesting suppository, Otherwise just weak       Objective: Vitals:   10/29/17 2143 10/29/17 2300 10/30/17 0300 10/30/17 0841  BP: 111/69 (!) 146/80 125/69 106/79  Pulse: 98 84 90 82  Resp:  16    Temp:  98 F (36.7 C) 98.9 F (37.2 C) (!) 97.5 F (36.4 C)  TempSrc:  Oral Oral Oral  SpO2:  100% 97% 95%  Weight:      Height:        Intake/Output Summary (Last 24 hours) at 10/30/2017 1030 Last data filed at 10/30/2017 0915 Gross per 24 hour  Intake 2580 ml  Output 1050 ml  Net 1530 ml   Filed Weights   10/27/17 2324  Weight: (!) 147 kg (324 lb)    Examination:  Constitutional: NAD, calm, comfortable, diaphoretic Eyes: PERRL, lids and conjunctivae normal ENMT: Mucous membranes are moist. Posterior pharynx clear of any exudate or lesions.Normal dentition.  Neck: normal, supple, no masses, no thyromegaly Respiratory: clear to auscultation bilaterally, no wheezing, no crackles. Normal respiratory effort. No accessory muscle use.  Cardiovascular: Regular rate and rhythm, no murmurs / rubs / gallops. No extremity edema. 2+ pedal pulses. No carotid bruits.  Abdomen: no tenderness, no masses palpated. No hepatosplenomegaly. Bowel sounds positive.  Musculoskeletal: no clubbing / cyanosis. No joint deformity upper and lower extremities. Good ROM, no contractures. Normal muscle tone.  Skin: no rashes, lesions, ulcers. No induration Neurologic: Unable to fully assess but grossly moving all the extremities and following basic commands Psychiatric: Flat affect     Data Reviewed:   CBC: Recent Labs  Lab 10/28/17 0030  WBC 7.4  NEUTROABS 4.9    HGB 13.3  HCT 41.8  MCV 78.9  PLT 217   Basic Metabolic Panel: Recent Labs  Lab 10/28/17 0030  NA 135  K 3.7  CL 100*  CO2 23  GLUCOSE 91  BUN 10  CREATININE 1.22  CALCIUM 9.1   GFR: Estimated Creatinine Clearance: 139.7 mL/min (by C-G formula based on SCr of 1.22 mg/dL). Liver Function Tests: Recent Labs  Lab 10/28/17 0030  AST 16  ALT 27  ALKPHOS 66  BILITOT 1.0  PROT 6.7  ALBUMIN 3.8   No results for input(s): LIPASE, AMYLASE in the last 168 hours. No results for input(s): AMMONIA in the last 168 hours. Coagulation Profile: No results for input(s): INR, PROTIME in the last 168 hours. Cardiac Enzymes: No results for input(s): CKTOTAL, CKMB, CKMBINDEX, TROPONINI in the last 168 hours. BNP (last 3 results) No results for input(s): PROBNP in the last 8760 hours. HbA1C: No results for input(s): HGBA1C in the last 72 hours. CBG: Recent Labs  Lab 10/28/17 0035 10/29/17 0237  GLUCAP 86 135*   Lipid Profile: No results for input(s): CHOL, HDL, LDLCALC, TRIG, CHOLHDL, LDLDIRECT in the last 72 hours. Thyroid Function Tests: No results for input(s): TSH, T4TOTAL, FREET4, T3FREE, THYROIDAB in the last 72 hours. Anemia Panel: No results for input(s): VITAMINB12, FOLATE, FERRITIN, TIBC, IRON, RETICCTPCT in the last 72 hours. Sepsis Labs: No results for input(s): PROCALCITON, LATICACIDVEN in the last 168 hours.  No results found for this or any previous visit (from the past 240 hour(s)).       Radiology Studies: Ct Head Wo Contrast  Result Date: 10/29/2017 CLINICAL DATA:  LEFT-sided weakness, altered mental status. History of multiple sclerosis, hypertension. EXAM: CT HEAD WITHOUT CONTRAST TECHNIQUE: Contiguous axial images were obtained from the base of the skull through the vertex without intravenous contrast. COMPARISON:  MRI of the head October 28, 2016 FINDINGS: BRAIN: No intraparenchymal hemorrhage, mass effect nor midline shift. Moderate parenchymal brain  volume loss. No hydrocephalus. Patchy white matter hypodensities. Cavum septum pellucidum et vergae. No acute large vascular territory infarct. No abnormal extra-axial fluid collections. VASCULAR: Unremarkable. SKULL/SOFT TISSUES: No skull fracture. Old LEFT nasal bone fracture. No significant soft tissue swelling. ORBITS/SINUSES: The included ocular globes and orbital contents are normal.The mastoid aircells and included paranasal sinuses are well-aerated. OTHER: None. IMPRESSION: 1. No acute intracranial process. 2. Stable moderate parenchymal brain volume loss and severe white matter changes previously characterized as demyelination. Electronically Signed   By: Awilda Metro M.D.   On: 10/29/2017 03:47        Scheduled Meds: . amLODipine  10 mg Oral Daily  . apixaban  5 mg Oral BID  . propranolol  10 mg Oral BID   Continuous Infusions:    LOS: 2 days    Time spent: 32 mins    Richarda Overlie, MD [facility] Pager [phone]   If 7PM-7AM, please contact night-coverage [email or web address] Password [email or web address] 10/30/2017, 10:30 AM

## 2017-10-30 NOTE — Clinical Social Work Note (Signed)
Clinical Social Work Assessment  Patient Details  Name: Brandon [name] MRN: [id number] Date of Birth: 1990/12/06  Date of referral:  10/30/17               Reason for consult:  Facility Placement, Discharge Planning                Permission sought to share information with:  Facility Sport and exercise psychologist, Family Supports Permission granted to share information::  Yes, Verbal Permission Granted  Name::     Ecologist::  SNFs  Relationship::  mother  Contact Information:  [phone]  Housing/Transportation Living arrangements for the past 2 months:  Apartment Source of Information:  Parent Patient Interpreter Needed:  None Criminal Activity/Legal Involvement Pertinent to Current Situation/Hospitalization:  No - Comment as needed Significant Relationships:  Warehouse manager, Parents, Other Family Members Lives with:  Parents, Relatives Do you feel safe going back to the place where you live?  Yes Need for family participation in patient care:  Yes (Comment)(pt needs help with IADLs and ADLs)  Care giving concerns:  Pt has MS and is noncompliant, lives at home with mother who is also in wheelchair. She feels unable to currently meet his needs.    Social Worker assessment / plan:  CSW met with pt and pt mother at bedside, pt did not participate much during assessment. Pt lives with pt mother and occasionally father at home, pt mother in wheelchair and relies on [facility] transport, she states she has some paralysis. Pt mother states she was helping him at home but since he has become recently "deconditioned" that she needs help taking care of him and would like him to have more intensive rehab services than he can get at home.   Pt mother states she needs help and would like any SNF offers, aware of delay due to [facility] and that pt options may be limited due to care needs.   Employment status:  Disabled (Comment on whether or not currently receiving  Disability) Insurance information:  Medicare, Medicaid In [place] PT Recommendations:  [place], 24 Hour Supervision Information / Referral to community resources:  [place]  Patient/Family's Response to care: Pt mother understands CSW role, aware of SNF and in agreement, unable to assess pt reaction.   Patient/Family's Understanding of and Emotional Response to Diagnosis, Current Treatment, and Prognosis: Pt mother states understanding of diagnosis, current treament, and prognosis. States high expectations that pt will walk again and return home. Was pleasant and chatty with this Probation officer.   Emotional Assessment Appearance:  Appears stated age Attitude/Demeanor/Rapport:  Guarded, Unable to Assess Affect (typically observed):  Quiet, Guarded, Unable to Assess Orientation:  Oriented to Place, Oriented to Self Alcohol / Substance use:  Not Applicable Psych involvement (Current and /or in the community):  Outpatient Provider([facility])  Discharge Needs  Concerns to be addressed:  Compliance Issues Concerns, Discharge Planning Concerns, Care Coordination Readmission within the last 30 days:  Yes Current discharge risk:  Physical Impairment, Cognitively Impaired, Dependent with Mobility Barriers to Discharge:  Continued Medical Work up, Programmer, applications ([place])   Alexander Mt, [place] 10/30/2017, 1:52 PM

## 2017-10-30 NOTE — Care Management Note (Signed)
Case Management Note  Patient Details  Name: Brandon [name] MRN: [id number] Date of Birth: 07-24-1990  Subjective/Objective:  27 year old male with past medical history of multiple sclerosis, schizophrenia, chronic pain, ADHD, bipolar, essential hypertension into the hospital for change in mental status and weakness.  PTA, pt resided at home with mother and grandmother; needs assistance with all ADLS.                    Action/Plan: PT/OT recommending SNF.  CSW consulted to facilitate possible dc to SNF pending medical stability. Will follow progress.   Expected Discharge Date:                  Expected Discharge Plan:  Skilled Nursing Facility  In-House Referral:  Clinical Social Work  Discharge planning Services  CM Consult  Post Acute Care Choice:    Choice offered to:     DME Arranged:    DME Agency:     HH Arranged:    HH Agency:     Status of Service:  In process, will continue to follow  If discussed at Long Length of Stay Meetings, dates discussed:    Additional Comments:  Quintella Baton, RN, BSN  Trauma/Neuro ICU Case Manager [phone]

## 2017-10-31 ENCOUNTER — Inpatient Hospital Stay (HOSPITAL_COMMUNITY): Payer: Medicare Other

## 2017-10-31 ENCOUNTER — Other Ambulatory Visit: Payer: Self-pay

## 2017-10-31 DIAGNOSIS — G35 Multiple sclerosis: Principal | ICD-10-CM

## 2017-10-31 DIAGNOSIS — R531 Weakness: Secondary | ICD-10-CM

## 2017-10-31 DIAGNOSIS — R440 Auditory hallucinations: Secondary | ICD-10-CM

## 2017-10-31 LAB — CBC
HCT: 37.6 % — ABNORMAL LOW (ref 39.0–52.0)
Hemoglobin: 12.1 g/dL — ABNORMAL LOW (ref 13.0–17.0)
MCH: 25.4 pg — ABNORMAL LOW (ref 26.0–34.0)
MCHC: 32.2 g/dL (ref 30.0–36.0)
MCV: 78.8 fL (ref 78.0–100.0)
Platelets: 216 10*3/uL (ref 150–400)
RBC: 4.77 MIL/uL (ref 4.22–5.81)
RDW: 15 % (ref 11.5–15.5)
WBC: 12.9 10*3/uL — ABNORMAL HIGH (ref 4.0–10.5)

## 2017-10-31 LAB — URINALYSIS, ROUTINE W REFLEX MICROSCOPIC
Bilirubin Urine: NEGATIVE
Glucose, UA: NEGATIVE mg/dL
Hgb urine dipstick: NEGATIVE
Ketones, ur: NEGATIVE mg/dL
Leukocytes, UA: NEGATIVE
Nitrite: NEGATIVE
Protein, ur: NEGATIVE mg/dL
Specific Gravity, Urine: 1.023 (ref 1.005–1.030)
pH: 7 (ref 5.0–8.0)

## 2017-10-31 LAB — COMPREHENSIVE METABOLIC PANEL
ALT: 26 U/L (ref 17–63)
AST: 13 U/L — ABNORMAL LOW (ref 15–41)
Albumin: 3.1 g/dL — ABNORMAL LOW (ref 3.5–5.0)
Alkaline Phosphatase: 56 U/L (ref 38–126)
Anion gap: 11 (ref 5–15)
BUN: 16 mg/dL (ref 6–20)
CO2: 25 mmol/L (ref 22–32)
Calcium: 9.1 mg/dL (ref 8.9–10.3)
Chloride: 101 mmol/L (ref 101–111)
Creatinine, Ser: 0.86 mg/dL (ref 0.61–1.24)
GFR calc Af Amer: >60 mL/min (ref >60)
GFR calc non Af Amer: >60 mL/min (ref >60)
Glucose, Bld: 189 mg/dL — ABNORMAL HIGH (ref 65–99)
Potassium: 4.1 mmol/L (ref 3.5–5.1)
Sodium: 137 mmol/L (ref 135–145)
Total Bilirubin: 0.6 mg/dL (ref 0.3–1.2)
Total Protein: 5.8 g/dL — ABNORMAL LOW (ref 6.5–8.1)

## 2017-10-31 MED ORDER — TRAZODONE HCL 100 MG PO TABS
100.0000 mg | ORAL_TABLET | Freq: Every evening | ORAL | Status: DC | PRN
  Filled 2017-10-31: qty 1

## 2017-10-31 MED ORDER — CLOZAPINE 25 MG PO TABS: 150.0000 mg | ORAL_TABLET | Freq: Every day | ORAL | Status: DC

## 2017-10-31 MED ORDER — MAGNESIUM CITRATE PO SOLN
1.0000 | Freq: Once | ORAL | Status: AC
  Administered 2017-10-31: 1 via ORAL
  Filled 2017-10-31: qty 296

## 2017-10-31 NOTE — Clinical Social Work Note (Signed)
Pt PASRR is pending and will not be received until Monday. Pt will not be able to go to facility without PASRR.  [place], [place] [phone]

## 2017-10-31 NOTE — Discharge Instructions (Addendum)
Follow with Primary MD Devra Dopp, MD in 7 days   Get CBC, CMP, 2 view Chest X ray checked  by  SNF MD in 5-7 days    Activity: As tolerated with Full fall precautions use walker/cane & assistance as needed  Disposition Home    Diet  Heart Healthy with feeding assistance and aspiration precautions.  For Heart failure patients - Check your Weight same time everyday, if you gain over 2 pounds, or you develop in leg swelling, experience more shortness of breath or chest pain, call your Primary MD immediately. Follow Cardiac Low Salt Diet and 1.5 lit/day fluid restriction.  Special Instructions: If you have smoked or chewed Tobacco  in the last 2 yrs please stop smoking, stop any regular Alcohol  and or any Recreational drug use.  On your next visit with your primary care physician please Get Medicines reviewed and adjusted.  Please request your Prim.MD to go over all Hospital Tests and Procedure/Radiological results at the follow up, please get all Hospital records sent to your Prim MD by signing hospital release before you go home.  If you experience worsening of your admission symptoms, develop shortness of breath, life threatening emergency, suicidal or homicidal thoughts you must seek medical attention immediately by calling 911 or calling your MD immediately  if symptoms less severe.  You Must read complete instructions/literature along with all the possible adverse reactions/side effects for all the Medicines you take and that have been prescribed to you. Take any new Medicines after you have completely understood and accpet all the possible adverse reactions/side effects.      Information on my medicine - ELIQUIS (apixaban)  Why was Eliquis prescribed for you? Eliquis was prescribed for you to reduce the risk of forming blood clots with a past medical history of clots  What do You need to know about Eliquis ? Take your Eliquis TWICE DAILY - one tablet in the morning and  one tablet in the evening with or without food.  It would be best to take the doses about the same time each day.  If you have difficulty swallowing the tablet whole please discuss with your pharmacist how to take the medication safely.  Take Eliquis exactly as prescribed by your doctor and DO NOT stop taking Eliquis without talking to the doctor who prescribed the medication.  Stopping may increase your risk of developing a new clot or stroke.  Refill your prescription before you run out.  After discharge, you should have regular check-up appointments with your healthcare provider that is prescribing your Eliquis.  In the future your dose may need to be changed if your kidney function or weight changes by a significant amount or as you get older.  What do you do if you miss a dose? If you miss a dose, take it as soon as you remember on the same day and resume taking twice daily.  Do not take more than one dose of ELIQUIS at the same time.  Important Safety Information A possible side effect of Eliquis is bleeding. You should call your healthcare provider right away if you experience any of the following: ? Bleeding from an injury or your nose that does not stop. ? Unusual colored urine (red or dark brown) or unusual colored stools (red or black). ? Unusual bruising for unknown reasons. ? A serious fall or if you hit your head (even if there is no bleeding).  Some medicines may interact with Eliquis and might increase  your risk of bleeding or clotting while on Eliquis. To help avoid this, consult your healthcare provider or pharmacist prior to using any new prescription or non-prescription medications, including herbals, vitamins, non-steroidal anti-inflammatory drugs (NSAIDs) and supplements.  This website has more information on Eliquis (apixaban): [email or web address].com.cy.

## 2017-10-31 NOTE — Progress Notes (Signed)
Paged Dr. Thedore Mins to clarify restarting Clozapine. Based on note from Psychiatry on 4/25, it appears that patient self-discontinued clozapine 3 weeks ago. In the plan of that note, clozapine was also NOT one of the medications mentioned to be continued while inpatient.

## 2017-10-31 NOTE — Progress Notes (Signed)
PROGRESS NOTE    Brandon [name]  ZOX:[id number] DOB: 08-05-1990 DOA: 10/27/2017 PCP: Devra Dopp, MD   Brief Narrative:  27 year old male with past medical history of multiple sclerosis, schizophrenia, chronic pain, ADHD, bipolar, essential hypertension into the hospital for change in mental status and weakness.  His CT of the head was negative for any acute changes with the MRI showed severe chronic demyelination with few hyperacute and acute demyelination plaques therefore he was started on steroids for acute MS flare and admitted. Patient being followed by neurology. Neurology recommended psychiatric consultation for patient's acute depressive episodes and abulic state.  Subjective: Patient in bed, appears comfortable, denies any headache, no fever, no chest pain or pressure, no shortness of breath , no abdominal pain. No focal weakness.   Assessment & Plan:   Acute multiple sclerosis exacerbation -  History of multiple sclerosis but medically noncompliant, consult, seen by neurology had any has finished his IV Solu-Medrol treated treatment course, PT OT on report, stable for SNF discharge once bed is arranged.  Also seen by psych and cleared for discharge.  Affect remains flat, denies any suicidal or homicidal ideations.  Outpatient follows with Dr. Anne Hahn.  History of pulmonary embolism  - he is on Eliquis.  Essential hypertension  -Continue home medications- Norvasc 10mg  daily, Inderal.  Schizophrenia with auditory hallucination - -Continue his home meds- Clozapine, as he appears slightly tired would decrease trazodone dose and monitor.  Morbid obesity with BMI greater than 41  -Nutrition consulted, with PCP for weight loss.  Mild nonspecific leukocytosis.  Could be due to recent steroid exposure, will check chest x-ray and UA to rule out any infectious source, afebrile for now.     DVT prophylaxis: on Eliquis Code Status: Full  Family Communication:  None at bedside    Disposition Plan: needs SNF when bed available, needs [facility] due to psyche hx   Consultants:   Neurology  Psychiatry  Objective: Vitals:   10/30/17 2002 10/31/17 0030 10/31/17 0435 10/31/17 1000  BP: (!) 159/94 117/69 118/65 124/75  Pulse: 81 (!) 59 (!) 58 68  Resp: (!) 22 20 (!) 22   Temp: 98.6 F (37 C) 97.8 F (36.6 C) 98.9 F (37.2 C)   TempSrc: Oral Oral Oral   SpO2: 96% 95% 95%   Weight:      Height:        Intake/Output Summary (Last 24 hours) at 10/31/2017 1301 Last data filed at 10/30/2017 2100 Gross per 24 hour  Intake 480 ml  Output 700 ml  Net -220 ml   Filed Weights   10/27/17 2324  Weight: (!) 147 kg (324 lb)    Examination:  Awake Alert, Oriented X 3, No new F.N deficits, Falt affect [place].AT,PERRAL Supple Neck,No JVD, No cervical lymphadenopathy appriciated.  Symmetrical Chest wall movement, Good air movement bilaterally, CTAB RRR,No Gallops, Rubs or new Murmurs, No Parasternal Heave +ve B.Sounds, Abd Soft, No tenderness, No organomegaly appriciated, No rebound - guarding or rigidity. No Cyanosis, Clubbing or edema, No new Rash or bruise   Data Reviewed:   CBC: Recent Labs  Lab 10/28/17 0030 10/31/17 0507  WBC 7.4 12.9*  NEUTROABS 4.9  --   HGB 13.3 12.1*  HCT 41.8 37.6*  MCV 78.9 78.8  PLT 217 216   Basic Metabolic Panel: Recent Labs  Lab 10/28/17 0030 10/31/17 0507  NA 135 137  K 3.7 4.1  CL 100* 101  CO2 23 25  GLUCOSE 91 189*  BUN 10  16  CREATININE 1.22 0.86  CALCIUM 9.1 9.1   GFR: Estimated Creatinine Clearance: 198.2 mL/min (by C-G formula based on SCr of 0.86 mg/dL). Liver Function Tests: Recent Labs  Lab 10/28/17 0030 10/31/17 0507  AST 16 13*  ALT 27 26  ALKPHOS 66 56  BILITOT 1.0 0.6  PROT 6.7 5.8*  ALBUMIN 3.8 3.1*   No results for input(s): LIPASE, AMYLASE in the last 168 hours. No results for input(s): AMMONIA in the last 168 hours. Coagulation Profile: No results for input(s): INR, PROTIME in the  last 168 hours. Cardiac Enzymes: No results for input(s): CKTOTAL, CKMB, CKMBINDEX, TROPONINI in the last 168 hours. BNP (last 3 results) No results for input(s): PROBNP in the last 8760 hours. HbA1C: No results for input(s): HGBA1C in the last 72 hours. CBG: Recent Labs  Lab 10/28/17 0035 10/29/17 0237  GLUCAP 86 135*   Lipid Profile: No results for input(s): CHOL, HDL, LDLCALC, TRIG, CHOLHDL, LDLDIRECT in the last 72 hours. Thyroid Function Tests: No results for input(s): TSH, T4TOTAL, FREET4, T3FREE, THYROIDAB in the last 72 hours. Anemia Panel: No results for input(s): VITAMINB12, FOLATE, FERRITIN, TIBC, IRON, RETICCTPCT in the last 72 hours. Sepsis Labs: No results for input(s): PROCALCITON, LATICACIDVEN in the last 168 hours.  No results found for this or any previous visit (from the past 240 hour(s)).       Radiology Studies: No results found.   Scheduled Meds: . amLODipine  10 mg Oral Daily  . apixaban  5 mg Oral BID  . polyethylene glycol  17 g Oral BID  . propranolol  10 mg Oral BID   Continuous Infusions:    LOS: 3 days    Time spent: 32 mins  Signature  Susa Raring M.D on 10/31/2017 at 1:01 PM  Between 7am to 7pm - Pager - [phone] ( page via [email or web address], text pages only, please mention full 10 digit call back number).  After 7pm go to [email or web address] - password [email or web address]

## 2017-11-01 ENCOUNTER — Encounter (HOSPITAL_COMMUNITY): Payer: Self-pay | Admitting: General Practice

## 2017-11-01 DIAGNOSIS — F200 Paranoid schizophrenia: Secondary | ICD-10-CM

## 2017-11-01 LAB — CBC
HCT: 39.1 % (ref 39.0–52.0)
Hemoglobin: 12.3 g/dL — ABNORMAL LOW (ref 13.0–17.0)
MCH: 25.1 pg — ABNORMAL LOW (ref 26.0–34.0)
MCHC: 31.5 g/dL (ref 30.0–36.0)
MCV: 79.6 fL (ref 78.0–100.0)
Platelets: 202 10*3/uL (ref 150–400)
RBC: 4.91 MIL/uL (ref 4.22–5.81)
RDW: 15.2 % (ref 11.5–15.5)
WBC: 8.9 10*3/uL (ref 4.0–10.5)

## 2017-11-01 NOTE — Progress Notes (Signed)
Reason for consult: MS progression/exacerbation   Subjective: Patient's affect is improved, having breakfast by himself. His motor strength has also improved.    ROS: negative except above  Examination  Vital signs in last 24 hours: Temp:  [98.7 F (37.1 C)-99 F (37.2 C)] 98.9 F (37.2 C) (04/28 0920) Pulse Rate:  [63-74] 74 (04/28 0920) Resp:  [16-20] 20 (04/28 0920) BP: (103-122)/(54-68) 122/68 (04/28 0920) SpO2:  [96 %-98 %] 98 % (04/28 0920)  General: Not in distress, cooperative CVS: pulse-normal rate and rhythm RS: breathing comfortably Extremities: normal   Neuro: MS: Alert, oriented, follows commands, slow speech CN: pupils equal and reactive,  EOMI, face symmetric, tongue midline, normal sensation over face, Motor: 4/5 strength in both UE, 3/5 strength in both lower extremities Reflexes: 2+ bilaterally over patella, biceps, plantars: flexor Coordination: normal Gait: not tested  Basic Metabolic Panel: Recent Labs  Lab 10/28/17 0030 10/31/17 0507  NA 135 137  K 3.7 4.1  CL 100* 101  CO2 23 25  GLUCOSE 91 189*  BUN 10 16  CREATININE 1.22 0.86  CALCIUM 9.1 9.1    CBC: Recent Labs  Lab 10/28/17 0030 10/31/17 0507 11/01/17 0816  WBC 7.4 12.9* 8.9  NEUTROABS 4.9  --   --   HGB 13.3 12.1* 12.3*  HCT 41.8 37.6* 39.1  MCV 78.9 78.8 79.6  PLT 217 216 202     Coagulation Studies: No results for input(s): LABPROT, INR in the last 72 hours.  Imaging Reviewed:      ASSESSMENT AND PLAN 26 y.o.malewith a history of HTN, Obesity, severe MS, schizophrenia presents with weakness and altered mental status.  MRI showed small acute demyelinating lesions in addition to his chronic MS lesions. However, this does not explain patient's current abulic state. While he does have some weakness due to MS, it is highly effort dependent. Depression is often associated with MS and suspect he has a major depressive episode which explains his abulic state. He has  improved since he has been in the hospital.    Impressions Major Depressive Episode MS exacerbation   Recommendations PT/OT F/u with psychiatry outpatient  Completed 3 days of methylprednisone F/U with his outpatient neurologist  Likely will be admitted to SNF vs rehab     Kaley Jutras [facility] Pager Number [phone] For questions after 7pm please refer to AMION to reach the Neurologist on call

## 2017-11-01 NOTE — Clinical Social Work Note (Signed)
PASRR still pending. NO bed offers at this time.   [place], [place] [phone]

## 2017-11-01 NOTE — Progress Notes (Signed)
PROGRESS NOTE    Brandon OBRYANT  [name]:[id number] DOB: 1991-01-09 DOA: 10/27/2017 PCP: Devra Dopp, MD   Brief Narrative:  27 year old male with past medical history of multiple sclerosis, schizophrenia, chronic pain, ADHD, bipolar, essential hypertension into the hospital for change in mental status and weakness.  His CT of the head was negative for any acute changes with the MRI showed severe chronic demyelination with few hyperacute and acute demyelination plaques therefore he was started on steroids for acute MS flare and admitted. Patient being followed by neurology. Neurology recommended psychiatric consultation for patient's acute depressive episodes and abulic state.  Subjective:  Patient in bed, appears comfortable, denies any headache, no fever, no chest pain or pressure, no shortness of breath , no abdominal pain. No focal weakness.  Assessment & Plan:   Acute multiple sclerosis exacerbation -  History of multiple sclerosis but medically noncompliant, consult, seen by neurology had any has finished his IV Solu-Medrol treated treatment course, PT OT on report, stable for SNF discharge once bed is arranged.  Also seen by psych and cleared for discharge.  Affect remains flat, denies any suicidal or homicidal ideations.  Outpatient follows with Dr. Anne Hahn.  History of pulmonary embolism  - he is on Eliquis.  Essential hypertension  -Continue home medications- Norvasc 10mg  daily, Inderal.  Schizophrenia with auditory hallucination - per psych continue to hold clozapine due to side effects and patient had actually stopped this medication few days prior to admission, as he appears slightly tired would decrease trazodone dose and monitor.  Morbid obesity with BMI greater than 41  - Nutrition consulted, with PCP for weight loss.  Mild nonspecific leukocytosis.  Was reactive due to recent steroid use stable chest x-ray and UA, resolved.     DVT prophylaxis: on Eliquis Code Status:  Full  Family Communication:  None at bedside  Disposition Plan: needs SNF when bed available, needs [facility] due to psyche hx   Consultants:   Neurology  Psychiatry  Objective: Vitals:   10/31/17 1950 11/01/17 0003 11/01/17 0357 11/01/17 0920  BP: 118/66 (!) 103/54 115/64 122/68  Pulse: 63 72 66 74  Resp: 16 20 20 20   Temp:  98.7 F (37.1 C) 99 F (37.2 C) 98.9 F (37.2 C)  TempSrc:  Oral Oral Oral  SpO2: 96% 96% 96% 98%  Weight:      Height:        Intake/Output Summary (Last 24 hours) at 11/01/2017 1110 Last data filed at 11/01/2017 0946 Gross per 24 hour  Intake 240 ml  Output 450 ml  Net -210 ml   Filed Weights   10/27/17 2324  Weight: (!) 147 kg (324 lb)    Examination:  Awake Alert, Oriented X 3, No new F.N deficits, and symmetrical and 4/5 in all 4 extremities, flat affect [place].AT,PERRAL Supple Neck,No JVD, No cervical lymphadenopathy appriciated.  Symmetrical Chest wall movement, Good air movement bilaterally, CTAB RRR,No Gallops, Rubs or new Murmurs, No Parasternal Heave +ve B.Sounds, Abd Soft, No tenderness, No organomegaly appriciated, No rebound - guarding or rigidity. No Cyanosis, Clubbing or edema, No new Rash or bruise  Data Reviewed:   CBC: Recent Labs  Lab 10/28/17 0030 10/31/17 0507 11/01/17 0816  WBC 7.4 12.9* 8.9  NEUTROABS 4.9  --   --   HGB 13.3 12.1* 12.3*  HCT 41.8 37.6* 39.1  MCV 78.9 78.8 79.6  PLT 217 216 202   Basic Metabolic Panel: Recent Labs  Lab 10/28/17 0030 10/31/17 0507  NA  135 137  K 3.7 4.1  CL 100* 101  CO2 23 25  GLUCOSE 91 189*  BUN 10 16  CREATININE 1.22 0.86  CALCIUM 9.1 9.1   GFR: Estimated Creatinine Clearance: 198.2 mL/min (by C-G formula based on SCr of 0.86 mg/dL). Liver Function Tests: Recent Labs  Lab 10/28/17 0030 10/31/17 0507  AST 16 13*  ALT 27 26  ALKPHOS 66 56  BILITOT 1.0 0.6  PROT 6.7 5.8*  ALBUMIN 3.8 3.1*   No results for input(s): LIPASE, AMYLASE in the last 168 hours. No  results for input(s): AMMONIA in the last 168 hours. Coagulation Profile: No results for input(s): INR, PROTIME in the last 168 hours. Cardiac Enzymes: No results for input(s): CKTOTAL, CKMB, CKMBINDEX, TROPONINI in the last 168 hours. BNP (last 3 results) No results for input(s): PROBNP in the last 8760 hours. HbA1C: No results for input(s): HGBA1C in the last 72 hours. CBG: Recent Labs  Lab 10/28/17 0035 10/29/17 0237  GLUCAP 86 135*   Lipid Profile: No results for input(s): CHOL, HDL, LDLCALC, TRIG, CHOLHDL, LDLDIRECT in the last 72 hours. Thyroid Function Tests: No results for input(s): TSH, T4TOTAL, FREET4, T3FREE, THYROIDAB in the last 72 hours. Anemia Panel: No results for input(s): VITAMINB12, FOLATE, FERRITIN, TIBC, IRON, RETICCTPCT in the last 72 hours. Sepsis Labs: No results for input(s): PROCALCITON, LATICACIDVEN in the last 168 hours.  No results found for this or any previous visit (from the past 240 hour(s)).       Radiology Studies: Dg Chest 2 View  Result Date: 10/31/2017 CLINICAL DATA:  Cough and lethargy EXAM: CHEST - 2 VIEW COMPARISON:  08/11/2017 CXR FINDINGS: Mild cardiac enlargement in part due to the AP projection. No aortic aneurysm. Minimal atelectasis at the left lung base. No acute osseous abnormality. IMPRESSION: No active cardiopulmonary disease. Electronically Signed   By: Tollie Eth M.D.   On: 10/31/2017 18:00     Scheduled Meds: . amLODipine  10 mg Oral Daily  . apixaban  5 mg Oral BID  . polyethylene glycol  17 g Oral BID  . propranolol  10 mg Oral BID   Continuous Infusions:    LOS: 4 days    Time spent: 32 mins  Signature  Susa Raring M.D on 11/01/2017 at 11:10 AM  Between 7am to 7pm - Pager - [phone] ( page via [email or web address], text pages only, please mention full 10 digit call back number).  After 7pm go to [email or web address] - password [facility]

## 2017-11-02 DIAGNOSIS — R531 Weakness: Secondary | ICD-10-CM

## 2017-11-02 LAB — URINE CULTURE

## 2017-11-02 MED ORDER — POLYETHYLENE GLYCOL 3350 17 G PO PACK: 17.0000 g | PACK | Freq: Two times a day (BID) | ORAL | 0 refills | Status: DC

## 2017-11-02 MED ORDER — ELIQUIS 5 MG VTE STARTER PACK: 5.0000 mg | ORAL_TABLET | Freq: Two times a day (BID) | ORAL | Status: DC

## 2017-11-02 MED ORDER — ACETAMINOPHEN 325 MG PO TABS: 650.0000 mg | ORAL_TABLET | Freq: Four times a day (QID) | ORAL | Status: DC | PRN

## 2017-11-02 NOTE — Care Management Important Message (Signed)
Important Message  Patient Details  Name: Brandon [name] MRN: [id number] Date of Birth: Dec 22, 1990   Medicare Important Message Given:  Yes    Dorena Bodo 11/02/2017, 2:40 PM

## 2017-11-02 NOTE — Clinical Social Work Placement (Addendum)
Nurse to call report to [phone], Room 115  Transport set for 5:30 PM    CLINICAL SOCIAL WORK PLACEMENT  NOTE  Date:  11/02/2017  Patient Details  Name: Brandon [name] MRN: [id number] Date of Birth: 25-Dec-1990  Clinical Social Work is seeking post-discharge placement for this patient at the Skilled  Nursing Facility level of care (*CSW will initial, date and re-position this form in  chart as items are completed):  Yes   Patient/family provided with [facility] Clinical Social Work Department's list of facilities offering this level of care within the geographic area requested by the patient (or if unable, by the patient's family).  Yes   Patient/family informed of their freedom to choose among providers that offer the needed level of care, that participate in Medicare, Medicaid or managed care program needed by the patient, have an available bed and are willing to accept the patient.  Yes   Patient/family informed of [facility]'s ownership interest in [facility] and [facility], as well as of the fact that they are under no obligation to receive care at these facilities.  PASRR submitted to EDS on       PASRR number received on 11/02/17     Existing PASRR number confirmed on       FL2 transmitted to all facilities in geographic area requested by pt/family on       FL2 transmitted to all facilities within larger geographic area on       Patient informed that his/her managed care company has contracts with or will negotiate with certain facilities, including the following:        Yes   Patient/family informed of bed offers received.  Patient chooses bed at [facility]     Physician recommends and patient chooses bed at      Patient to be transferred to [facility] on 11/02/17.  Patient to be transferred to facility by [facility]     Patient family notified on 11/02/17 of transfer.  Name of family member notified:  [place]     PHYSICIAN        Additional Comment:    _______________________________________________ Baldemar Lenis, LCSW 11/02/2017, 4:00 PM

## 2017-11-02 NOTE — Progress Notes (Signed)
Physical Therapy Treatment Patient Details Name: Brandon [name] MRN: [id number] DOB: 07/03/1991 Today's Date: 11/02/2017    History of Present Illness 27 year old male with past medical history of multiple sclerosis, schizophrenia, chronic pain, ADHD, bipolar, essential hypertension into the hospital for change in mental status and weakness.  His CT of the head was negative for any acute changes with the MRI showed severe chronic demyelination with few hyperacute and acute demyelination plaques therefore he was started on steroids for acute MS flare     PT Comments    Patient received in bed with mother present and assisting in encouraging patient to participate in session today. Of note, he gives verbal responses to PT less than 30% of the time and has very flat affect, also some inappropriate responses such as laughing when therapist assists in adjusting him in bed. He requires ModAx2 for supine to sit, however was able to complete functional transfers today with Min-ModAx2 with RW. Unable to have bowel movement on commode, and was returned to bed with Min guard for transfer from commode back to bed with RW. He was left in bed with all needs met, alarm activated this afternoon, mother aware of limitations of PT session today.    Follow Up Recommendations  SNF;Supervision/Assistance - 24 hour     Equipment Recommendations  None recommended by PT    Recommendations for Other Services       Precautions / Restrictions Precautions Precautions: Fall Restrictions Weight Bearing Restrictions: No    Mobility  Bed Mobility Overal bed mobility: Needs Assistance Bed Mobility: Supine to Sit;Sit to Supine     Supine to sit: Mod assist;+2 for physical assistance Sit to supine: Supervision   General bed mobility comments: ModAx2 for mobility to EOB, Mod-Max cues for safety as patient has very poor safety awareness, however he is able to quickly return himself to bed from sitting on the side  with S and assist for line mangement   Transfers Overall transfer level: Needs assistance Equipment used: Rolling walker (2 wheeled) Transfers: Sit to/from [facility] Sit to Stand: Mod assist;+2 physical assistance;From elevated surface Stand pivot transfers: Min assist;+2 physical assistance       General transfer comment: ModAx2 for sit to stand from bed and elevated surface, MinAx2 for safety during pivot transfer to [facility] Min guard x2 for stand from bedside commode/pivot back to bed.   Ambulation/Gait             General Gait Details: pivotal steps during transfers    Stairs             Wheelchair Mobility    Modified Rankin (Stroke Patients Only)       Balance Overall balance assessment: Needs assistance;History of Falls   Sitting balance-Leahy Scale: Fair Sitting balance - Comments: poor safety awareness     Standing balance-Leahy Scale: Fair Standing balance comment: reliant on external support, poor safety awareness                             Cognition Arousal/Alertness: Lethargic Behavior During Therapy: Flat affect Overall Cognitive Status: Impaired/Different from baseline Area of Impairment: Orientation;Attention;Following commands;Safety/judgement;Awareness;Problem solving                 Orientation Level: Disoriented to;Time;Situation Current Attention Level: Sustained   Following Commands: Follows one step commands inconsistently;Follows one step commands with increased time Safety/Judgement: Decreased awareness of safety;Decreased awareness of deficits Awareness: Intellectual Problem  Solving: Slow processing;Decreased initiation;Difficulty sequencing;Requires verbal cues;Requires tactile cues General Comments: patient with verbal responses to questions less than 30% of the time, slow responses and often patient just lays in bed and looks at PT when questions are asked       Exercises      General  Comments        Pertinent Vitals/Pain Pain Assessment: Faces Pain Score: 0-No pain Faces Pain Scale: No hurt Pain Intervention(s): Limited activity within patient's tolerance;Monitored during session    Home Living                      Prior Function            PT Goals (current goals can now be found in the care plan section) Acute Rehab PT Goals Patient Stated Goal: return to walking PT Goal Formulation: With family Time For Goal Achievement: 11/12/17 Potential to Achieve Goals: Fair Progress towards PT goals: Progressing toward goals    Frequency    Min 3X/week      PT Plan Current plan remains appropriate    Co-evaluation              AM-PAC PT "6 Clicks" Daily Activity  Outcome Measure  Difficulty turning over in bed (including adjusting bedclothes, sheets and blankets)?: A Little Difficulty moving from lying on back to sitting on the side of the bed? : A Lot Difficulty sitting down on and standing up from a chair with arms (e.g., wheelchair, bedside commode, etc,.)?: A Little Help needed moving to and from a bed to chair (including a wheelchair)?: A Little Help needed walking in hospital room?: A Lot Help needed climbing 3-5 steps with a railing? : Total 6 Click Score: 14    End of Session Equipment Utilized During Treatment: Gait belt Activity Tolerance: Patient tolerated treatment well Patient left: in bed;with call bell/phone within reach;with bed alarm set;with family/visitor present Nurse Communication: Mobility status PT Visit Diagnosis: Other abnormalities of gait and mobility (R26.89);Muscle weakness (generalized) (M62.81);History of falling (Z91.81)     Time: 1410-1436 PT Time Calculation (min) (ACUTE ONLY): 26 min  Charges:  $Therapeutic Activity: 23-37 mins                    G Codes:       Deniece Ree PT, DPT, CBIS  Supplemental Physical Therapist [place]   Pager [phone]

## 2017-11-02 NOTE — Discharge Summary (Signed)
Brandon [name] NWG:[id number] DOB: 08-Feb-1991 DOA: 10/27/2017  PCP: Devra Dopp, MD  Admit date: 10/27/2017  Discharge date: 11/02/2017  Admitted From: Home   Disposition:  SNF   Recommendations for Outpatient Follow-up:   Follow up with PCP in 1-2 weeks  PCP Please obtain BMP/CBC, 2 view CXR in 1week,  (see Discharge instructions)   PCP Please follow up on the following pending results: Needs follow-up with neurology and psychiatry within a week for MS and schizophrenia.   Home Health: None  Equipment/Devices: None  Consultations: Neuro,Psych Discharge Condition: Fair   CODE STATUS: Full   Diet Recommendation:  Heart Healthy    Chief Complaint  Patient presents with  . Weakness     Brief history of present illness from the day of admission and additional interim summary    27 year old male with past medical history of multiple sclerosis, schizophrenia, chronic pain, ADHD, bipolar, essential hypertension into the hospital for change in mental status and weakness.  His CT of the head was negative for any acute changes with the MRI showed severe chronic demyelination with few hyperacute and acute demyelination plaques therefore he was started on steroids for acute MS flare and admitted. Patient being followed by neurology. Neurology recommended psychiatric consultation for patient's acute depressive episodes and abulic state.                                                                 Hospital Course    Acute multiple sclerosis exacerbation -  History of multiple sclerosis but medically noncompliant, consult, seen by neurology had any has finished his IV Solu-Medrol treated treatment course, PT OT on report, stable for SNF discharge once bed is arranged.  Also seen by psych and cleared for discharge.   Affect remains flat, denies any suicidal or homicidal ideations.  Outpatient follows with Dr. Anne Hahn.  History of pulmonary embolism  - he is on Eliquis she will be continued.  Essential hypertension  -Continue home medications- Norvasc 10mg  daily, Inderal.  Schizophrenia with auditory hallucination - per psych continue to hold clozapine due to side effects and patient had actually stopped this medication few days prior to admission, extremely drowsy with trazodone hence that was stopped with good effect, he is more awake alert, currently no acute issues however will strongly encourage getting psych input within a week as definitely schizophrenia and possibly underlying depression are playing a role in his symptoms as well.  Morbid obesity with BMI greater than 41  - Nutrition consulted here, with PCP for weight loss.  Mild nonspecific leukocytosis.  Was reactive due to recent steroid use stable chest x-ray and UA, resolved.     Discharge diagnosis     Principal Problem:   Schizophrenia (HCC) Active Problems:   Obesity, Class III, BMI 40-49.9 (morbid  obesity) (HCC)   Ataxia   HTN (hypertension)   Abnormality of gait   Multiple sclerosis (HCC)   Auditory hallucination   Acute encephalopathy   Generalized weakness    Discharge instructions    Discharge Instructions    Diet - low sodium heart healthy   Complete by:  As directed    Discharge instructions   Complete by:  As directed    Follow with Primary MD Devra Dopp, MD in 7 days   Get CBC, CMP, 2 view Chest X ray checked  by  SNF MD in 5-7 days    Activity: As tolerated with Full fall precautions use walker/cane & assistance as needed  Disposition Home    Diet  Heart Healthy with feeding assistance and aspiration precautions.  For Heart failure patients - Check your Weight same time everyday, if you gain over 2 pounds, or you develop in leg swelling, experience more shortness of breath or chest pain, call  your Primary MD immediately. Follow Cardiac Low Salt Diet and 1.5 lit/day fluid restriction.  Special Instructions: If you have smoked or chewed Tobacco  in the last 2 yrs please stop smoking, stop any regular Alcohol  and or any Recreational drug use.  On your next visit with your primary care physician please Get Medicines reviewed and adjusted.  Please request your Prim.MD to go over all Hospital Tests and Procedure/Radiological results at the follow up, please get all Hospital records sent to your Prim MD by signing hospital release before you go home.  If you experience worsening of your admission symptoms, develop shortness of breath, life threatening emergency, suicidal or homicidal thoughts you must seek medical attention immediately by calling 911 or calling your MD immediately  if symptoms less severe.  You Must read complete instructions/literature along with all the possible adverse reactions/side effects for all the Medicines you take and that have been prescribed to you. Take any new Medicines after you have completely understood and accpet all the possible adverse reactions/side effects.   Increase activity slowly   Complete by:  As directed       Discharge Medications   Allergies as of 11/02/2017   No Known Allergies     Medication List    STOP taking these medications   clozapine 50 MG tablet Commonly known as:  CLOZARIL   Melatonin 5 MG Tabs   traZODone 100 MG tablet Commonly known as:  DESYREL     TAKE these medications   acetaminophen 325 MG tablet Commonly known as:  TYLENOL Take 2 tablets (650 mg total) by mouth every 6 (six) hours as needed for mild pain or moderate pain (or Fever >/= 101).   amLODipine 10 MG tablet Commonly known as:  NORVASC Take 10 mg by mouth daily.   ELIQUIS STARTER PACK 5 MG Tabs Take 5 mg by mouth 2 (two) times daily. What changed:  how much to take   INVEGA SUSTENNA 234 MG/1.5ML Susp injection Generic drug:   paliperidone Inject 234 mg into the muscle every 30 (thirty) days.   ocrelizumab in sodium chloride 0.9 % 500 mL 300mg  IV on day one, repeat 300mg  IV on day 15. Then 600mg  IV every 6 months after that   polyethylene glycol packet Commonly known as:  MIRALAX / GLYCOLAX Take 17 g by mouth 2 (two) times daily.   propranolol 10 MG tablet Commonly known as:  INDERAL Take 1 tablet (10 mg total) by mouth 2 (two) times daily.   TRAVATAN  Z 0.004 % Soln ophthalmic solution Generic drug:  Travoprost (BAK Free) Place 1 drop into both eyes at bedtime.       Follow-up Information    Devra Dopp, MD. Schedule an appointment as soon as possible for a visit in 1 week(s).   Specialty:  Family Medicine Why:  Also follow with your psychiatrist within 7 to 10 days for schizophrenia Contact information: [address] [place] [address] [phone]        York Spaniel, MD. Schedule an appointment as soon as possible for a visit in 1 week(s).   Specialty:  Neurology Contact information: [address] [place] [address] [phone]           Major procedures and Radiology Reports - PLEASE review detailed and final reports thoroughly  -        Dg Chest 2 View  Result Date: 10/31/2017 CLINICAL DATA:  Cough and lethargy EXAM: CHEST - 2 VIEW COMPARISON:  08/11/2017 CXR FINDINGS: Mild cardiac enlargement in part due to the AP projection. No aortic aneurysm. Minimal atelectasis at the left lung base. No acute osseous abnormality. IMPRESSION: No active cardiopulmonary disease. Electronically Signed   By: Tollie Eth M.D.   On: 10/31/2017 18:00   Ct Head Wo Contrast  Result Date: 10/29/2017 CLINICAL DATA:  LEFT-sided weakness, altered mental status. History of multiple sclerosis, hypertension. EXAM: CT HEAD WITHOUT CONTRAST TECHNIQUE: Contiguous axial images were obtained from the base of the skull through the vertex without intravenous  contrast. COMPARISON:  MRI of the head October 28, 2016 FINDINGS: BRAIN: No intraparenchymal hemorrhage, mass effect nor midline shift. Moderate parenchymal brain volume loss. No hydrocephalus. Patchy white matter hypodensities. Cavum septum pellucidum et vergae. No acute large vascular territory infarct. No abnormal extra-axial fluid collections. VASCULAR: Unremarkable. SKULL/SOFT TISSUES: No skull fracture. Old LEFT nasal bone fracture. No significant soft tissue swelling. ORBITS/SINUSES: The included ocular globes and orbital contents are normal.The mastoid aircells and included paranasal sinuses are well-aerated. OTHER: None. IMPRESSION: 1. No acute intracranial process. 2. Stable moderate parenchymal brain volume loss and severe white matter changes previously characterized as demyelination. Electronically Signed   By: Awilda Metro M.D.   On: 10/29/2017 03:47   Ct Head Wo Contrast  Result Date: 10/28/2017 CLINICAL DATA:  Altered level of consciousness. Patient is more lethargic. History of multiple sclerosis. EXAM: CT HEAD WITHOUT CONTRAST TECHNIQUE: Contiguous axial images were obtained from the base of the skull through the vertex without intravenous contrast. COMPARISON:  10/22/2017 FINDINGS: Brain: Mild diffuse cerebral atrophy is prominent for patient's age. Mild ventricular dilatation consistent with central atrophy. Patchy low-attenuation changes throughout the deep white matter are nonspecific but compatible with history of multiple sclerosis. Appearances are similar to previous study. No mass-effect or midline shift. No abnormal extra-axial fluid collections. Gray-white matter junctions are distinct. Basal cisterns are not effaced. No acute intracranial hemorrhage. Vascular: No hyperdense vessel or unexpected calcification. Skull: Normal. Negative for fracture or focal lesion. Sinuses/Orbits: No acute finding. Other: None. IMPRESSION: 1. No acute intracranial abnormalities. 2. Chronic atrophy  and white matter changes compatible with history of multiple sclerosis. No significant change since previous study. Electronically Signed   By: Burman Nieves M.D.   On: 10/28/2017 01:28   Ct Head Wo Contrast  Result Date: 10/22/2017 CLINICAL DATA:  Fall today, history of MS. Bilateral lower extremity pain and neck pain. EXAM: CT HEAD WITHOUT CONTRAST CT MAXILLOFACIAL WITHOUT CONTRAST CT CERVICAL SPINE WITHOUT  CONTRAST TECHNIQUE: Multidetector CT imaging of the head, cervical spine, and maxillofacial structures were performed using the standard protocol without intravenous contrast. Multiplanar CT image reconstructions of the cervical spine and maxillofacial structures were also generated. COMPARISON:  Head CT and cervical spine CT dated 09/22/2017. FINDINGS: CT HEAD FINDINGS Brain: Ventricles are stable in size and configuration. Confluent hypodensities in the bilateral periventricular and subcortical white matter regions, compatible with the given history of MS, stable appearance. No mass, hemorrhage, edema or other evidence of acute parenchymal abnormality. No extra-axial hemorrhage. Vascular: No hyperdense vessel or unexpected calcification. Skull: Normal. Negative for fracture or focal lesion. Other: None. CT MAXILLOFACIAL FINDINGS Osseous: Lower frontal bones are intact. Stable old slightly displaced deformity of the LEFT nasal bone. Osseous structures about the orbits are intact and normally aligned bilaterally. Bilateral zygomatic arches and pterygoid plates are intact. Walls of the maxillary sinuses are intact and normally aligned bilaterally. No mandible fracture or displacement seen. Orbits: Negative. No traumatic or inflammatory finding. Sinuses: Clear. Soft tissues: No circumscribed soft tissue hematoma. CT CERVICAL SPINE FINDINGS Alignment: Normal. Skull base and vertebrae: No fracture line or displaced fracture fragment. Facet joints appear intact and normally aligned throughout. Soft tissues  and spinal canal: No prevertebral fluid or swelling. No visible canal hematoma. Disc levels: Disc spaces are well maintained throughout. No significant central canal stenosis appreciated at any level. Upper chest: Negative. Other: None. IMPRESSION: 1. No acute intracranial abnormality. No intracranial hemorrhage or edema. No skull fracture. Hypodense areas within the periventricular white matter bilaterally, compatible with the given history of MS, stable compared to previous exams. 2. No acute facial bone fracture or displacement. 3. No fracture or acute subluxation within the cervical spine. Electronically Signed   By: Bary Richard M.D.   On: 10/22/2017 20:37   Ct Cervical Spine Wo Contrast  Result Date: 10/22/2017 CLINICAL DATA:  Fall today, history of MS. Bilateral lower extremity pain and neck pain. EXAM: CT HEAD WITHOUT CONTRAST CT MAXILLOFACIAL WITHOUT CONTRAST CT CERVICAL SPINE WITHOUT CONTRAST TECHNIQUE: Multidetector CT imaging of the head, cervical spine, and maxillofacial structures were performed using the standard protocol without intravenous contrast. Multiplanar CT image reconstructions of the cervical spine and maxillofacial structures were also generated. COMPARISON:  Head CT and cervical spine CT dated 09/22/2017. FINDINGS: CT HEAD FINDINGS Brain: Ventricles are stable in size and configuration. Confluent hypodensities in the bilateral periventricular and subcortical white matter regions, compatible with the given history of MS, stable appearance. No mass, hemorrhage, edema or other evidence of acute parenchymal abnormality. No extra-axial hemorrhage. Vascular: No hyperdense vessel or unexpected calcification. Skull: Normal. Negative for fracture or focal lesion. Other: None. CT MAXILLOFACIAL FINDINGS Osseous: Lower frontal bones are intact. Stable old slightly displaced deformity of the LEFT nasal bone. Osseous structures about the orbits are intact and normally aligned bilaterally.  Bilateral zygomatic arches and pterygoid plates are intact. Walls of the maxillary sinuses are intact and normally aligned bilaterally. No mandible fracture or displacement seen. Orbits: Negative. No traumatic or inflammatory finding. Sinuses: Clear. Soft tissues: No circumscribed soft tissue hematoma. CT CERVICAL SPINE FINDINGS Alignment: Normal. Skull base and vertebrae: No fracture line or displaced fracture fragment. Facet joints appear intact and normally aligned throughout. Soft tissues and spinal canal: No prevertebral fluid or swelling. No visible canal hematoma. Disc levels: Disc spaces are well maintained throughout. No significant central canal stenosis appreciated at any level. Upper chest: Negative. Other: None. IMPRESSION: 1. No acute intracranial abnormality. No intracranial hemorrhage or edema.  No skull fracture. Hypodense areas within the periventricular white matter bilaterally, compatible with the given history of MS, stable compared to previous exams. 2. No acute facial bone fracture or displacement. 3. No fracture or acute subluxation within the cervical spine. Electronically Signed   By: Bary Richard M.D.   On: 10/22/2017 20:37   Mr Brain W And Wo Contrast  Result Date: 10/28/2017 CLINICAL DATA:  Larey Seat. On Eliquis for pulmonary embolism. History of multiple sclerosis. EXAM: MRI HEAD WITHOUT AND WITH CONTRAST TECHNIQUE: Multiplanar, multiecho pulse sequences of the brain and surrounding structures were obtained without and with intravenous contrast. CONTRAST:  20mL MULTIHANCE GADOBENATE DIMEGLUMINE 529 MG/ML IV SOLN COMPARISON:  CT HEAD October 28, 2017 and MRI of the head July 30, 2017 FINDINGS: Multiple sequences are moderately motion degraded. INTRACRANIAL CONTENTS: Confluent supratentorial white matter FLAIR T2 hyperintensities in periventricular, deep and juxta cortical distribution relatively unchanged. Lesions demonstrate low T1 signal seen with black holes of demyelination. Some  lesions demonstrate faint reduced diffusion. Lesions are confluent in RIGHT frontal white matter. Subcentimeter enhancement RIGHT parietal white matter. Moderate parenchymal brain volume loss. Cavum septum pellucidum et vergae. No midline shift, mass effect or masses. No abnormal extra-axial fluid collections. VASCULAR: Normal major intracranial vascular flow voids present at skull base. SKULL AND UPPER CERVICAL SPINE: No abnormal sellar expansion. No suspicious calvarial bone marrow signal. Craniocervical junction maintained. SINUSES/ORBITS: The mastoid air-cells and included paranasal sinuses are well-aerated.The included ocular globes and orbital contents are non-suspicious. OTHER: None. IMPRESSION: 1. Motion degraded examination. Severe chronic demyelination with a few hyperacute and acute demyelinating plaques. 2. Moderate parenchymal brain volume loss. Electronically Signed   By: Awilda Metro M.D.   On: 10/28/2017 06:02   Dg Foot Complete Left  Result Date: 10/22/2017 CLINICAL DATA:  Fall today, pain. EXAM: LEFT FOOT - COMPLETE 3+ VIEW COMPARISON:  None. FINDINGS: There is no evidence of fracture or dislocation. There is no evidence of arthropathy or other focal bone abnormality. Soft tissues are unremarkable. IMPRESSION: Negative. Electronically Signed   By: Bary Richard M.D.   On: 10/22/2017 20:54   Dg Foot Complete Right  Result Date: 10/22/2017 CLINICAL DATA:  Fall today, pain. EXAM: RIGHT FOOT COMPLETE - 3+ VIEW COMPARISON:  None. FINDINGS: There is no evidence of fracture or dislocation. There is no evidence of arthropathy or other focal bone abnormality. Soft tissues are unremarkable. IMPRESSION: Negative. Electronically Signed   By: Bary Richard M.D.   On: 10/22/2017 20:54   Ct Maxillofacial Wo Contrast  Result Date: 10/22/2017 CLINICAL DATA:  Fall today, history of MS. Bilateral lower extremity pain and neck pain. EXAM: CT HEAD WITHOUT CONTRAST CT MAXILLOFACIAL WITHOUT CONTRAST CT  CERVICAL SPINE WITHOUT CONTRAST TECHNIQUE: Multidetector CT imaging of the head, cervical spine, and maxillofacial structures were performed using the standard protocol without intravenous contrast. Multiplanar CT image reconstructions of the cervical spine and maxillofacial structures were also generated. COMPARISON:  Head CT and cervical spine CT dated 09/22/2017. FINDINGS: CT HEAD FINDINGS Brain: Ventricles are stable in size and configuration. Confluent hypodensities in the bilateral periventricular and subcortical white matter regions, compatible with the given history of MS, stable appearance. No mass, hemorrhage, edema or other evidence of acute parenchymal abnormality. No extra-axial hemorrhage. Vascular: No hyperdense vessel or unexpected calcification. Skull: Normal. Negative for fracture or focal lesion. Other: None. CT MAXILLOFACIAL FINDINGS Osseous: Lower frontal bones are intact. Stable old slightly displaced deformity of the LEFT nasal bone. Osseous structures about the orbits are intact  and normally aligned bilaterally. Bilateral zygomatic arches and pterygoid plates are intact. Walls of the maxillary sinuses are intact and normally aligned bilaterally. No mandible fracture or displacement seen. Orbits: Negative. No traumatic or inflammatory finding. Sinuses: Clear. Soft tissues: No circumscribed soft tissue hematoma. CT CERVICAL SPINE FINDINGS Alignment: Normal. Skull base and vertebrae: No fracture line or displaced fracture fragment. Facet joints appear intact and normally aligned throughout. Soft tissues and spinal canal: No prevertebral fluid or swelling. No visible canal hematoma. Disc levels: Disc spaces are well maintained throughout. No significant central canal stenosis appreciated at any level. Upper chest: Negative. Other: None. IMPRESSION: 1. No acute intracranial abnormality. No intracranial hemorrhage or edema. No skull fracture. Hypodense areas within the periventricular white matter  bilaterally, compatible with the given history of MS, stable compared to previous exams. 2. No acute facial bone fracture or displacement. 3. No fracture or acute subluxation within the cervical spine. Electronically Signed   By: Bary Richard M.D.   On: 10/22/2017 20:37    Micro Results     Recent Results (from the past 240 hour(s))  Culture, Urine     Status: Abnormal   Collection Time: 10/31/17  1:03 PM  Result Value Ref Range Status   Specimen Description URINE, CLEAN CATCH  Final   Special Requests   Final    NONE Performed at [facility], [address] [place], [place] [address]    Culture MULTIPLE SPECIES PRESENT, SUGGEST RECOLLECTION (A)  Final   Report Status 11/02/2017 FINAL  Final    Today   Subjective    Brandon [name] today has no headache,no chest abdominal pain,no new weakness tingling or numbness, feels much better     Objective   Blood pressure 131/70, pulse 69, temperature 98.4 F (36.9 C), temperature source Oral, resp. rate 20, height 6\' 1"  (1.854 m), weight (!) 147 kg (324 lb), SpO2 100 %.   Intake/Output Summary (Last 24 hours) at 11/02/2017 0951 Last data filed at 11/02/2017 0422 Gross per 24 hour  Intake -  Output 1400 ml  Net -1400 ml    Exam Awake Alert, Oriented x 3, No new F.N deficits, strength is symmetrical and 4/5 in all 4 extremities, flat affect [place].AT,PERRAL Supple Neck,No JVD, No cervical lymphadenopathy appriciated.  Symmetrical Chest wall movement, Good air movement bilaterally, CTAB RRR,No Gallops,Rubs or new Murmurs, No Parasternal Heave +ve B.Sounds, Abd Soft, Non tender, No organomegaly appriciated, No rebound -guarding or rigidity. No Cyanosis, Clubbing or edema, No new Rash or bruise   Data Review   CBC w Diff:  Lab Results  Component Value Date   WBC 8.9 11/01/2017   HGB 12.3 (L) 11/01/2017   HGB 12.5 (L) 06/05/2017   HCT 39.1 11/01/2017   HCT 39.4 06/05/2017   PLT 202 11/01/2017   PLT 223 06/05/2017    LYMPHOPCT 22 10/28/2017   MONOPCT 10 10/28/2017   EOSPCT 2 10/28/2017   BASOPCT 0 10/28/2017    CMP:  Lab Results  Component Value Date   NA 137 10/31/2017   NA 141 06/05/2017   K 4.1 10/31/2017   CL 101 10/31/2017   CO2 25 10/31/2017   BUN 16 10/31/2017   BUN 9 06/05/2017   CREATININE 0.86 10/31/2017   PROT 5.8 (L) 10/31/2017   PROT 6.9 06/05/2017   ALBUMIN 3.1 (L) 10/31/2017   ALBUMIN 4.3 06/05/2017   BILITOT 0.6 10/31/2017   BILITOT 0.4 06/05/2017   ALKPHOS 56 10/31/2017   AST 13 (  L) 10/31/2017   ALT 26 10/31/2017  .   Total Time in preparing paper work, data evaluation and todays exam - 35 minutes  Susa Raring M.D on 11/02/2017 at 9:51 AM  [facility]   Office  [phone]

## 2017-11-07 ENCOUNTER — Other Ambulatory Visit: Payer: Self-pay

## 2017-11-07 ENCOUNTER — Encounter (HOSPITAL_COMMUNITY): Payer: Self-pay

## 2017-11-07 ENCOUNTER — Emergency Department (HOSPITAL_COMMUNITY)
Admission: EM | Admit: 2017-11-07 | Discharge: 2017-11-08 | Disposition: A | Discharge status: Skilled Nursing Facility | Payer: Medicare Other | Attending: Emergency Medicine | Admitting: Emergency Medicine

## 2017-11-07 DIAGNOSIS — Z79899 Other long term (current) drug therapy: Secondary | ICD-10-CM | POA: Diagnosis not present

## 2017-11-07 DIAGNOSIS — F22 Delusional disorders: Secondary | ICD-10-CM | POA: Diagnosis present

## 2017-11-07 DIAGNOSIS — I10 Essential (primary) hypertension: Secondary | ICD-10-CM | POA: Insufficient documentation

## 2017-11-07 DIAGNOSIS — M549 Dorsalgia, unspecified: Secondary | ICD-10-CM | POA: Diagnosis not present

## 2017-11-07 DIAGNOSIS — Z818 Family history of other mental and behavioral disorders: Secondary | ICD-10-CM | POA: Diagnosis not present

## 2017-11-07 DIAGNOSIS — F201 Disorganized schizophrenia: Secondary | ICD-10-CM | POA: Diagnosis present

## 2017-11-07 DIAGNOSIS — F209 Schizophrenia, unspecified: Secondary | ICD-10-CM | POA: Diagnosis present

## 2017-11-07 DIAGNOSIS — G35 Multiple sclerosis: Secondary | ICD-10-CM | POA: Diagnosis present

## 2017-11-07 DIAGNOSIS — R443 Hallucinations, unspecified: Secondary | ICD-10-CM | POA: Diagnosis present

## 2017-11-07 DIAGNOSIS — R440 Auditory hallucinations: Secondary | ICD-10-CM

## 2017-11-07 DIAGNOSIS — F17210 Nicotine dependence, cigarettes, uncomplicated: Secondary | ICD-10-CM | POA: Insufficient documentation

## 2017-11-07 DIAGNOSIS — F1290 Cannabis use, unspecified, uncomplicated: Secondary | ICD-10-CM | POA: Diagnosis not present

## 2017-11-07 DIAGNOSIS — F203 Undifferentiated schizophrenia: Secondary | ICD-10-CM | POA: Diagnosis not present

## 2017-11-07 DIAGNOSIS — Z046 Encounter for general psychiatric examination, requested by authority: Secondary | ICD-10-CM | POA: Insufficient documentation

## 2017-11-07 LAB — CBC WITH DIFFERENTIAL/PLATELET
Basophils Absolute: 0 10*3/uL (ref 0.0–0.1)
Basophils Relative: 0 %
Eosinophils Absolute: 0.1 10*3/uL (ref 0.0–0.7)
Eosinophils Relative: 1 %
HCT: 37.3 % — ABNORMAL LOW (ref 39.0–52.0)
Hemoglobin: 11.8 g/dL — ABNORMAL LOW (ref 13.0–17.0)
Lymphocytes Relative: 23 %
Lymphs Abs: 1.6 10*3/uL (ref 0.7–4.0)
MCH: 25.5 pg — ABNORMAL LOW (ref 26.0–34.0)
MCHC: 31.6 g/dL (ref 30.0–36.0)
MCV: 80.7 fL (ref 78.0–100.0)
Monocytes Absolute: 0.8 10*3/uL (ref 0.1–1.0)
Monocytes Relative: 12 %
Neutro Abs: 4.5 10*3/uL (ref 1.7–7.7)
Neutrophils Relative %: 64 %
Platelets: 193 10*3/uL (ref 150–400)
RBC: 4.62 MIL/uL (ref 4.22–5.81)
RDW: 15.2 % (ref 11.5–15.5)
WBC: 7 10*3/uL (ref 4.0–10.5)

## 2017-11-07 LAB — COMPREHENSIVE METABOLIC PANEL
ALT: 48 U/L (ref 17–63)
AST: 14 U/L — ABNORMAL LOW (ref 15–41)
Albumin: 3.6 g/dL (ref 3.5–5.0)
Alkaline Phosphatase: 70 U/L (ref 38–126)
Anion gap: 8 (ref 5–15)
BUN: 13 mg/dL (ref 6–20)
CO2: 25 mmol/L (ref 22–32)
Calcium: 8.9 mg/dL (ref 8.9–10.3)
Chloride: 106 mmol/L (ref 101–111)
Creatinine, Ser: 0.88 mg/dL (ref 0.61–1.24)
GFR calc Af Amer: >60 mL/min (ref >60)
GFR calc non Af Amer: >60 mL/min (ref >60)
Glucose, Bld: 112 mg/dL — ABNORMAL HIGH (ref 65–99)
Potassium: 3.9 mmol/L (ref 3.5–5.1)
Sodium: 139 mmol/L (ref 135–145)
Total Bilirubin: 0.5 mg/dL (ref 0.3–1.2)
Total Protein: 6.5 g/dL (ref 6.5–8.1)

## 2017-11-07 LAB — ETHANOL: Alcohol, Ethyl (B): <10 mg/dL (ref <10)

## 2017-11-07 MED ORDER — APIXABAN 5 MG PO TABS
5.0000 mg | ORAL_TABLET | Freq: Two times a day (BID) | ORAL | Status: DC
Start: 2017-11-07 — End: 2017-11-08
  Administered 2017-11-07 – 2017-11-08 (×2): 5 mg via ORAL
  Filled 2017-11-07 (×3): qty 1

## 2017-11-07 MED ORDER — AMLODIPINE BESYLATE 5 MG PO TABS
10.0000 mg | ORAL_TABLET | Freq: Every day | ORAL | Status: DC
  Administered 2017-11-07 – 2017-11-08 (×2): 10 mg via ORAL
  Filled 2017-11-07 (×2): qty 2

## 2017-11-07 MED ORDER — POLYETHYLENE GLYCOL 3350 17 G PO PACK
17.0000 g | PACK | Freq: Two times a day (BID) | ORAL | Status: DC
  Administered 2017-11-07 – 2017-11-08 (×2): 17 g via ORAL
  Filled 2017-11-07 (×2): qty 1

## 2017-11-07 MED ORDER — ACETAMINOPHEN 325 MG PO TABS: 650.0000 mg | ORAL_TABLET | ORAL | Status: DC | PRN

## 2017-11-07 MED ORDER — LATANOPROST 0.005 % OP SOLN
1.0000 [drp] | Freq: Every day | OPHTHALMIC | Status: DC
  Administered 2017-11-07: 1 [drp] via OPHTHALMIC
  Filled 2017-11-07 (×2): qty 2.5

## 2017-11-07 MED ORDER — ONDANSETRON HCL 4 MG PO TABS: 4.0000 mg | ORAL_TABLET | Freq: Three times a day (TID) | ORAL | Status: DC | PRN

## 2017-11-07 MED ORDER — ALUM & MAG HYDROXIDE-SIMETH 200-200-20 MG/5ML PO SUSP: 30.0000 mL | Freq: Four times a day (QID) | ORAL | Status: DC | PRN

## 2017-11-07 MED ORDER — PROPRANOLOL HCL 10 MG PO TABS
10.0000 mg | ORAL_TABLET | Freq: Two times a day (BID) | ORAL | Status: DC
  Administered 2017-11-08: 10 mg via ORAL
  Filled 2017-11-07 (×2): qty 1

## 2017-11-07 NOTE — ED Notes (Signed)
Pt ambulated to the restroom without assistance

## 2017-11-07 NOTE — ED Notes (Signed)
EDPA Provider at bedside. 

## 2017-11-07 NOTE — ED Notes (Signed)
Bed: [facility] Expected date:  Expected time:  Means of arrival:  Comments: 

## 2017-11-07 NOTE — ED Notes (Signed)
Pt ambulatory w/o difficulty from hall c to room 34

## 2017-11-07 NOTE — [facility] Assessment (Signed)
[facility] Assessment Progress Note This writer attempted a second time to contact patient's mother [facility]son [facility] [phone] to verify she was patient's guardian. Mother was unable to be contacted.

## 2017-11-07 NOTE — ED Provider Notes (Signed)
[facility]-EMERGENCY DEPT Provider Note   CSN: [id number] Arrival date & time: 11/07/17  1208     History   Chief Complaint Chief Complaint  Patient presents with  . Paranoid    HPI Brandon [name] is a 27 y.o. male.  HPI Brandon [name] is a 27 y.o. male with history of multiple sclerosis, hypertension, chronic pain, schizophrenia, bipolar disorder, presents to emergency department from [facility] health care with concern for hallucinations and paranoid behavior.  Patient was recently admitted for acute exacerbation of his MS.  He finished steroids.  He was discharged to skilled nursing facility.  According to the staff, he has been paranoid and hearing voices.  He is not on any psychiatric medications.  It looks like during his hospital stay they stopped his clozapine due to side effects and he was discharged with no medications for his schizophrenia.  Patient denies paranoid behavior but states he is hearing voices.  He is unable to tell me what voices are saying.  He is also complaining of chronic back pain.  Past Medical History:  Diagnosis Date  . ADHD (attention deficit hyperactivity disorder)   . Bipolar 1 disorder (HCC)   . Chronic back pain   . Chronic neck pain   . Hypertension   . Multiple sclerosis (HCC) 05/20/2013   left sided weakness, dysarthria  . Non-compliance   . Obesity   . Schizophrenia [facility])     Patient Active Problem List   Diagnosis Date Noted  . Generalized weakness   . Pulmonary embolism (HCC) 08/12/2017  . Hyperglycemia 07/31/2017  . Multiple sclerosis exacerbation (HCC) 07/30/2017  . Acute encephalopathy 07/30/2017  . Schizophrenia (HCC)   . Hallucinations 08/05/2014  . Auditory hallucination   . Left-sided weakness 01/18/2014  . Multiple sclerosis (HCC) 05/20/2013  . 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 Abnormality of gait 11/23/2012  . HTN (hypertension) 07/04/2011  . 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 Obesity,  Class III, BMI 40-49.9 (morbid obesity) (HCC) 01/14/2010  . ADHD 01/14/2010    Past Surgical History:  Procedure Laterality Date  . None          Home Medications    Prior to Admission medications   Medication Sig Start Date End Date Taking? Authorizing Provider  acetaminophen (TYLENOL) 325 MG tablet Take 2 tablets (650 mg total) by mouth every 6 (six) hours as needed for mild pain or moderate pain (or Fever >/= 101). 11/02/17   Leroy Sea, MD  amLODipine (NORVASC) 10 MG tablet Take 10 mg by mouth daily.    [provider]  ELIQUIS STARTER PACK (ELIQUIS STARTER PACK) 5 MG TABS Take 5 mg by mouth 2 (two) times daily. 11/02/17   Leroy Sea, MD  INVEGA SUSTENNA 234 MG/1.5ML SUSP injection Inject 234 mg into the muscle every 30 (thirty) days.  05/15/17   [provider]  ocrelizumab in sodium chloride 0.9 % 500 mL 300mg  IV on day one, repeat 300mg  IV on day 15. Then 600mg  IV every 6 months after that 09/02/17   York Spaniel, MD  polyethylene glycol [facility] / Ethelene Hal) packet Take 17 g by mouth 2 (two) times daily. 11/02/17   Leroy Sea, MD  propranolol (INDERAL) 10 MG tablet Take 1 tablet (10 mg total) by mouth 2 (two) times daily. 05/03/15   Charm Rings, NP  TRAVATAN Z 0.004 % SOLN ophthalmic solution Place 1 drop into both eyes at bedtime. 10/19/17  [provider]    Family History Family History  Problem Relation Age of Onset  . Diabetes Mother   . ADD / ADHD Brother     Social History Social History   Tobacco Use  . Smoking status: Current Every Day Smoker    Packs/day: 0.25    Types: Cigarettes  . Smokeless tobacco: Never Used  . Tobacco comment: 4 cigarettes a day  Substance Use Topics  . Alcohol use: Yes    Alcohol/week: 0.0 oz    Comment: "A little bit"   . Drug use: Yes    Types: Marijuana    Comment: Last used: unknown      Allergies   Patient has no known allergies.   Review of Systems Review of  Systems  Constitutional: Negative for chills and fever.  Respiratory: Negative for cough, chest tightness and shortness of breath.   Cardiovascular: Negative for chest pain, palpitations and leg swelling.  Gastrointestinal: Negative for abdominal distention, abdominal pain, diarrhea, nausea and vomiting.  Genitourinary: Negative for dysuria, frequency, hematuria and urgency.  Musculoskeletal: Positive for arthralgias, back pain and myalgias. Negative for neck pain and neck stiffness.  Skin: Negative for rash.  Allergic/Immunologic: Negative for immunocompromised state.  Neurological: Positive for weakness. Negative for dizziness, light-headedness and headaches.  Psychiatric/Behavioral: Positive for behavioral problems, decreased concentration and hallucinations.  All other systems reviewed and are negative.    Physical Exam Updated Vital Signs There were no vitals taken for this visit.  Physical Exam  Constitutional: He is oriented to person, place, and time. He appears well-developed and well-nourished. No distress.  HENT:  Head: Normocephalic and atraumatic.  Eyes: Conjunctivae are normal.  Neck: Neck supple.  Cardiovascular: Normal rate, regular rhythm and normal heart sounds.  Pulmonary/Chest: Effort normal. No respiratory distress. He has no wheezes. He has no rales.  Abdominal: Soft. Bowel sounds are normal. He exhibits no distension. There is no tenderness. There is no rebound.  Musculoskeletal: He exhibits no edema.  Neurological: He is alert and oriented to person, place, and time.  5 out of 5 and equal bilateral grip strength.  Decreased strength in bilateral lower extremities.  Patient can barely lift his legs off of the bed.  Decreased sensation in all dermatomes of lower extremities bilaterally.  Dorsal pedal pulses intact.  Poor coordination.  Unable to do finger-to-nose.  Speech is slow  Skin: Skin is warm and dry.  Psychiatric: His speech is delayed. He is slowed.  Thought content is paranoid.  Nursing note and vitals reviewed.    ED Treatments / Results  Labs (all labs ordered are listed, but only abnormal results are displayed) Labs Reviewed  CBC WITH DIFFERENTIAL/PLATELET - Abnormal; Notable for the following components:      Result Value   Hemoglobin 11.8 (*)    HCT 37.3 (*)    MCH 25.5 (*)    All other components within normal limits  COMPREHENSIVE METABOLIC PANEL - Abnormal; Notable for the following components:   Glucose, Bld 112 (*)    AST 14 (*)    All other components within normal limits  ETHANOL  RAPID URINE DRUG SCREEN, HOSP PERFORMED    EKG None  Radiology No results found.  Procedures Procedures (including critical care time)  Medications Ordered in ED Medications - No data to display   Initial Impression / Assessment and Plan / ED Course  I have reviewed the triage vital signs and the nursing notes.  Pertinent labs & imaging results that  were available during my care of the patient were reviewed by me and considered in my medical decision making (see chart for details).     Patient with recent admission for MS, where his clozapine was stopped.  He is here because he is hallucinating and paranoid.  Will get medical clearance labs and get TTS involved. Maybe to get recommendation to restart psychiatric medications.    3:43 PM Medically cleared. Pending TTS consult.    Vitals:   11/07/17 1259 11/07/17 1300  BP:  (!) 146/96  Pulse:  81  Resp:  20  Temp:  98.2 F (36.8 C)  TempSrc:  Oral  SpO2:  99%  Weight: (!) 147 kg (324 lb)   Height: 6\' 1"  (1.854 m)      Final Clinical Impressions(s) / ED Diagnoses   Final diagnoses:  Auditory hallucinations  Paranoia [facility])    ED Discharge Orders    None       Jaynie Crumble, PA-C 11/07/17 1543    Maia Plan, MD 11/07/17 1820

## 2017-11-07 NOTE — ED Notes (Signed)
Pt reports that he does hear voices and see things, but is not able to verbalize what they are.  Pt with blank stare and scattered responses to questions, appears to be responding to internal stimulus.  Pt oriented to unit.  Pt with strong urine odor, encouraged to shower.

## 2017-11-07 NOTE — ED Notes (Signed)
Dr Juleen China updated with pt's mother concerns reguarding medications-hold inderol until pt is evaluated in the AM

## 2017-11-07 NOTE — ED Notes (Signed)
Bed: [facility] Expected date:  Expected time:  Means of arrival:  Comments: Margo Aye c

## 2017-11-07 NOTE — ED Triage Notes (Signed)
Pt arrives via EMS from [facility]. MD at facility asked that pt be evaluated for increasing paranoia and auditory hallucinations. Pt has a hx of Schizophrenia. Per EMS: Facility reports that the pt is not prescribed any medication for psych issues and needs to be prescribed psych meds. Pt has hx of MS as well.

## 2017-11-07 NOTE — ED Notes (Signed)
Patient alert and oriented. Patient has a flat affect. He engages in minimal conversation with this Clinical research associate. Patient denies SI and HI. Patient voices A/V hallucinations.  Patient provided support and encouragement. Patient encouraged to to do ADL care. Q 15 minute checks in progress and patient remains safe on unit. Monitoring continues.

## 2017-11-07 NOTE — ED Notes (Addendum)
SECURITY ASSISTED PT TO SAPPU WITHOUT EVENT. BELONGING BAG X 1 WITH PT

## 2017-11-07 NOTE — ED Notes (Signed)
Up to the bathroom, ambulatory w/o difficulty 

## 2017-11-07 NOTE — ED Notes (Signed)
Patients belongings include  1 Pair of black pants  1 Blue short sleeve shirt  In one patient belongings bag and it is now secured in locker #34

## 2017-11-07 NOTE — ED Notes (Signed)
PT EATING AT BEDSIDE WITHOUT COMPLAINT. THIS WRITER REQUESTING PT TO REMAIN WITHOUT EATING UNTIL BLOOD DRAWN. HE VERBALIZED UNDERSTANDING
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
## 2017-11-07 NOTE — BH Assessment (Signed)
[facility] Assessment Progress Note  Case was staffed with lord DNP who recommended patient be monitored and observed for safety. Patient will also be evaluated for medication management to assist with stabilization. Patient will be seen by psychiatry in the a.m.

## 2017-11-07 NOTE — ED Notes (Addendum)
Patients mother [name] Daubenspeck-guardian-phone # [phone]. She reports that he is at [facility]/17 for rehab/PT, he normally lives with her and family.  Mother does not want him put on Clozaril or propanalol  Due to the side affects that he has from them (drooling and behavioral changes).  She will send a copy of the guardianship with his father tonight when he comes.

## 2017-11-08 DIAGNOSIS — F17210 Nicotine dependence, cigarettes, uncomplicated: Secondary | ICD-10-CM | POA: Diagnosis not present

## 2017-11-08 DIAGNOSIS — Z818 Family history of other mental and behavioral disorders: Secondary | ICD-10-CM | POA: Diagnosis not present

## 2017-11-08 DIAGNOSIS — F1290 Cannabis use, unspecified, uncomplicated: Secondary | ICD-10-CM

## 2017-11-08 DIAGNOSIS — F419 Anxiety disorder, unspecified: Secondary | ICD-10-CM

## 2017-11-08 DIAGNOSIS — G4700 Insomnia, unspecified: Secondary | ICD-10-CM

## 2017-11-08 DIAGNOSIS — F203 Undifferentiated schizophrenia: Secondary | ICD-10-CM | POA: Diagnosis not present

## 2017-11-08 DIAGNOSIS — R4587 Impulsiveness: Secondary | ICD-10-CM

## 2017-11-08 DIAGNOSIS — R450 Nervousness: Secondary | ICD-10-CM | POA: Diagnosis not present

## 2017-11-08 LAB — RAPID URINE DRUG SCREEN, HOSP PERFORMED
Amphetamines: NOT DETECTED
Barbiturates: NOT DETECTED
Benzodiazepines: NOT DETECTED
Cocaine: NOT DETECTED
Opiates: NOT DETECTED
Tetrahydrocannabinol: NOT DETECTED

## 2017-11-08 MED ORDER — OXCARBAZEPINE 300 MG PO TABS: 300.0000 mg | ORAL_TABLET | Freq: Two times a day (BID) | ORAL | 0 refills | Status: DC

## 2017-11-08 MED ORDER — TRAZODONE HCL 100 MG PO TABS: 100.0000 mg | ORAL_TABLET | Freq: Every evening | ORAL | Status: DC | PRN

## 2017-11-08 MED ORDER — OXCARBAZEPINE 300 MG PO TABS
300.0000 mg | ORAL_TABLET | Freq: Two times a day (BID) | ORAL | Status: DC
Start: 2017-11-08 — End: 2017-11-08
  Administered 2017-11-08: 300 mg via ORAL
  Filled 2017-11-08: qty 1

## 2017-11-08 MED ORDER — PALIPERIDONE ER 6 MG PO TB24: 6.0000 mg | ORAL_TABLET | Freq: Every day | ORAL | 0 refills | Status: DC

## 2017-11-08 MED ORDER — TRAZODONE HCL 100 MG PO TABS: 100.0000 mg | ORAL_TABLET | Freq: Every evening | ORAL | 0 refills | Status: DC | PRN

## 2017-11-08 MED ORDER — PALIPERIDONE ER 6 MG PO TB24
6.0000 mg | ORAL_TABLET | Freq: Every day | ORAL | Status: DC
Start: 2017-11-08 — End: 2017-11-08
  Administered 2017-11-08: 6 mg via ORAL
  Filled 2017-11-08: qty 1

## 2017-11-08 NOTE — ED Notes (Signed)
Patient has urinated in bed x 3 tonight. Bed cleaned and clean linen applied. Patient encouraged to perform hygiene but refused. Patient given clean scrubs and hygiene products, and assisted to bathroom; however did not perform hygiene or put on clean scrubs he refused. Will talk with patient again.
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
## 2017-11-08 NOTE — ED Notes (Signed)
Pt has been made aware of need to provide urine specimen.  

## 2017-11-08 NOTE — Progress Notes (Signed)
Per psychiatrist Akintayo and DNP Lord, patient psychiatrically cleared and ready for discharge. DNP reported that she spoke with patient's mother and the plan is for patient to discharge back to SNF.  Patient presented to [facility] from [facility]. Per chart review, patient was dc to [facility] SNF on 11/02/17 for ST rehab.  CSW contacted [facility] SNF admissions staff [name] multiple times, no answer. CSW left voicemail requesting return phone call. CSW contacted [facility] and spoke with staff , staff reported that patient no longer has a bed available. CSW informed staff that patient came to the ED from their facility and is ready to discharge. Staff reported that they are full and unable to accept patient. Staff provided CSW with admissions staff member [name] phone number.  CSW contacted [facility] SNF admissions staff member [name], no answer. CSW left voicemail requesting return phone call.   CSW contacted patient's mother Darra Lis Roesel [phone]) and provided update. [name] reported that patient was supposed to return to SNF Room 224B. [name] reported that she spoke with admissions earlier today and that patient was supposed to return. [name] reported that if patient can't discharge back to SNF that he can return home and hospital sets up transportation. [name] reported that she was going to follow up with SNF. CSW agreed to call SNF back to see if patient could possibly dc back.  CSW contacted [facility] and spoke with staff member Shanda Bumps. Staff reported that once patient has been gone for 24 hours they are officially discharges. CSW informed staff that patient's Room is 224B and inquired if patient's bed was full. Staff reported that she was unable to confirm, CSW requested to speak with someone else. Staff put staff member Jerry Caras on the phone, CSW asked staff to see if patient's bed 224B was full.  Staff agreed to check and placed CSW on hold. Staff member Shanda Bumps returned to the phone and reported that she spoke with administrator and that they have to accept patient back. Staff reported that patient's bed is full and they have to find out which bed they can accept patient back to. Staff agreed to contact CSW to provide update on patient's ability to return.   CSW awaiting return phone call.   Celso Sickle, [place] Clinical Social Worker [facility] Cell#: [phone]

## 2017-11-08 NOTE — ED Notes (Addendum)
Pt had urinated in the bed. He was urged to go to the bathroom to urinate, whereupon he urinated in the floor approximately one foot from the toilet bowl. Pt's bed was cleaned and he was assisted in changing his sheets.

## 2017-11-08 NOTE — ED Notes (Signed)
Copy of Guardian papers placed in patient chart.

## 2017-11-08 NOTE — ED Notes (Signed)
Lunch tray at bedside. ?

## 2017-11-08 NOTE — ED Notes (Signed)
Pt was discharged per order in care of [facility] back to his facility. He was in no acute distress at discharge. All belongings were given to transport service, as were prescriptions, MAR, and AVS.

## 2017-11-08 NOTE — Progress Notes (Signed)
CSW received return call from [facility] staff member Abundio Miu. Staff reported that patient could be sent back. Staff reported that patient did not need a new FL2. Staff reported that patient could arrive anytime.   CSW updated patient's RN.  CSW contacted [facility].   Patient to discharge back to [facility] via [facility]. CSW signing off, no other needs identified at this time.  Celso Sickle, [place] Clinical Social Worker [facility] Cell#: [phone]

## 2017-11-08 NOTE — ED Notes (Signed)
Pt denies SI and HI. He says he last experienced AVH "the other day" / "Tuesday." He was cooperative with med administration. He asked for something to help him sleep and was reminded of the time of day. Pt was reminded of need to provide urine specimen as well as location of bathroom and  encouraged to toilet/shower. Pt indicated he wanted to rest. Will continue to monitor for needs/safety.

## 2017-11-08 NOTE — [facility] Suicide Risk Assessment (Signed)
Suicide Risk Assessment  Discharge Assessment   [facility] Discharge Suicide Risk Assessment   Principal Problem: Schizophrenia [facility]) Discharge Diagnoses:  Patient Active Problem List   Diagnosis Date Noted  . Schizophrenia ([facility]) [F20.9]     Priority: High  . Hallucinations [R44.3] 08/05/2014    Priority: High  . Multiple sclerosis ([facility]) [G35] 05/20/2013    Priority: High  . Generalized weakness [R53.1]   . Pulmonary embolism ([facility]) [I26.99] 08/12/2017  . Hyperglycemia [R73.9] 07/31/2017  . Multiple sclerosis exacerbation ([facility]) [G35] 07/30/2017  . Acute encephalopathy [G93.40] 07/30/2017  . Auditory hallucination [R44.0]   . Left-sided weakness [R53.1] 01/18/2014  . White matter abnormality on MRI of brain [R90.82] 11/23/2012  . Abnormality of gait [R26.9] 11/23/2012  . HTN (hypertension) [I10] 07/04/2011  . Ataxia [R27.0] 07/02/2011  . Obesity, Class III, BMI 40-49.9 (morbid obesity) (HCC) [E66.01] 01/14/2010  . ADHD [F90.9] 01/14/2010    Total Time spent with patient: 45 minutes  Musculoskeletal: Strength & Muscle Tone: within normal limits Gait & Station: normal Patient leans: N/A  Psychiatric Specialty Exam: Physical Exam  Constitutional: He is oriented to person, place, and time. He appears well-developed and well-nourished.  HENT:  Head: Normocephalic.  Neck: Normal range of motion.  Respiratory: Effort normal.  Musculoskeletal: Normal range of motion.  Neurological: He is alert and oriented to person, place, and time.  Psychiatric: His speech is normal and behavior is normal. Thought content normal. His mood appears anxious. His affect is blunt. Cognition and memory are normal. He expresses impulsivity.    Review of Systems  Psychiatric/Behavioral: The patient is nervous/anxious.   All other systems reviewed and are negative.   Blood pressure 129/77, pulse 61, temperature 98.3 F (36.8 C), temperature source Oral, resp. rate 16, height 6\' 1"  (1.854 m), weight (!)  147 kg (324 lb), SpO2 98 %.Body mass index is 42.75 kg/m.  General Appearance: Casual  Eye Contact:  Good  Speech:  Normal Rate  Volume:  Normal  Mood:  Anxious, mild  Affect:  Congruent  Thought Process:  Coherent and Descriptions of Associations: Intact  Orientation:  Full (Time, Place, and Person)  Thought Content:  WDL and Logical  Suicidal Thoughts:  No  Homicidal Thoughts:  No  Memory:  Immediate;   Good Recent;   Good Remote;   Good  Judgement:  Fair  Insight:  Fair  Psychomotor Activity:  Normal  Concentration:  Concentration: Good and Attention Span: Good  Recall:  Good  Fund of Knowledge:  Fair  Language:  Good  Akathisia:  No  Handed:  Right  AIMS (if indicated):     Assets:  Housing Leisure Time Physical Health Resilience Social Support  ADL's:  Intact  Cognition:  Impaired,  Mild  Sleep:      Mental Status Per Nursing Assessment::   On Admission:   psychosis  Demographic Factors:  Male and Adolescent or young adult  Loss Factors: NA  Historical Factors: Impulsivity  Risk Reduction Factors:   Sense of responsibility to family, Living with another person, especially a relative, Positive social support and Positive therapeutic relationship  Continued Clinical Symptoms:  Anxiety, mild  Cognitive Features That Contribute To Risk:  None    Suicide Risk:  Minimal: No identifiable suicidal ideation.  Patients presenting with no risk factors but with morbid ruminations; may be classified as minimal risk based on the severity of the depressive symptoms    Plan Of Care/Follow-up recommendations:  Activity:  as tolerated Diet:  heart  healthy diet  Rontae Inglett, NP 11/08/2017, 1:30 PM

## 2017-11-13 ENCOUNTER — Encounter: Payer: Self-pay | Admitting: Neurology

## 2017-11-13 ENCOUNTER — Ambulatory Visit (INDEPENDENT_AMBULATORY_CARE_PROVIDER_SITE_OTHER): Payer: Medicare Other | Admitting: Neurology

## 2017-11-13 VITALS — BP 143/79 | HR 63 | Wt 306.5 lb

## 2017-11-13 DIAGNOSIS — R269 Unspecified abnormalities of gait and mobility: Secondary | ICD-10-CM | POA: Diagnosis not present

## 2017-11-13 DIAGNOSIS — G35 Multiple sclerosis: Secondary | ICD-10-CM | POA: Diagnosis not present

## 2017-11-13 NOTE — Progress Notes (Signed)
Reason for visit: Multiple sclerosis  Brandon [name] is an 27 y.o. male  History of present illness:  Brandon [name] is a 27 year old right-handed black male with a history of multiple sclerosis.  The patient has schizophrenia, he had been on Clozaril but this resulted in too much sedation and he was switched off to lithium and Invega.  The patient has had multiple hospital visits on 19 March, 18 April, 23 April, and 4 May.  The patient has had significant MS exacerbations after he stopped his Tysabri, there was a delay in getting him started on Ocrevus as he did not have the proper insurance information.  The patient eventually started Ocrevus in February 2019.  MRI of the brain done in January 2019 showed extensive white matter changes with evidence of a new lesion in the right splenium/occipital lobe and left thalamus.  The patient also had another MRI of the brain in April 2019 that also showed a few hyper acute and acute demyelinating plaques.  The patient has been in the rehab facility, just recently was discharged.  He has regained his ability to ambulate fairly well.  He remains somewhat sedated on medications.  The patient returns for an evaluation.  The patient himself reports no focal numbness or weakness or vision changes at this time.  He does have high intraocular pressures, he is being treated for glaucoma.  Past Medical History:  Diagnosis Date  . ADHD (attention deficit hyperactivity disorder)   . Bipolar 1 disorder (HCC)   . Chronic back pain   . Chronic neck pain   . Hypertension   . Multiple sclerosis (HCC) 05/20/2013   left sided weakness, dysarthria  . Non-compliance   . Obesity   . Schizophrenia [facility])     Past Surgical History:  Procedure Laterality Date  . None      Family History  Problem Relation Age of Onset  . Diabetes Mother   . ADD / ADHD Brother     Social history:  reports that he has been smoking cigarettes.  He has been smoking about 0.25 packs  per day. He has never used smokeless tobacco. He reports that he drinks alcohol. He reports that he has current or past drug history. Drug: Marijuana.   No Known Allergies  Medications:  Prior to Admission medications   Medication Sig Start Date End Date Taking? Authorizing Provider  acetaminophen (TYLENOL) 325 MG tablet Take 2 tablets (650 mg total) by mouth every 6 (six) hours as needed for mild pain or moderate pain (or Fever >/= 101). 11/02/17   Leroy Sea, MD  amLODipine (NORVASC) 10 MG tablet Take 10 mg by mouth daily.    [provider]  ELIQUIS STARTER PACK (ELIQUIS STARTER PACK) 5 MG TABS Take 5 mg by mouth 2 (two) times daily. 11/02/17   Leroy Sea, MD  INVEGA SUSTENNA 234 MG/1.5ML SUSP injection Inject 234 mg into the muscle every 30 (thirty) days.  05/15/17   [provider]  ocrelizumab in sodium chloride 0.9 % 500 mL 300mg  IV on day one, repeat 300mg  IV on day 15. Then 600mg  IV every 6 months after that 09/02/17   York Spaniel, MD  Oxcarbazepine (TRILEPTAL) 300 MG tablet Take 1 tablet (300 mg total) by mouth 2 (two) times daily. 11/08/17   Charm Rings, NP  paliperidone (INVEGA) 6 MG 24 hr tablet Take 1 tablet (6 mg total) by mouth daily. 11/09/17   Charm Rings, NP  polyethylene glycol (MIRALAX / GLYCOLAX) packet Take 17 g by mouth 2 (two) times daily. 11/02/17   Leroy Sea, MD  propranolol (INDERAL) 10 MG tablet Take 1 tablet (10 mg total) by mouth 2 (two) times daily. Patient not taking: Reported on 11/13/2017 05/03/15   Charm Rings, NP  TRAVATAN Z 0.004 % SOLN ophthalmic solution Place 1 drop into both eyes at bedtime. 10/19/17   [provider]  traZODone (DESYREL) 100 MG tablet Take 1 tablet (100 mg total) by mouth at bedtime as needed (agitation). 11/08/17   Charm Rings, NP    ROS:  Out of a complete 14 system review of symptoms, the patient complains only of the following symptoms, and all other reviewed systems are  negative.  Drooling Frequency of urination Back pain Snoring Itching Agitation, decreased concentration, hallucinations  Blood pressure (!) 143/79, pulse 63, weight (!) 306 lb 8 oz (139 kg).  Physical Exam  General: The patient is alert and cooperative at the time of the examination.  The patient is markedly obese.  Skin: No significant peripheral edema is noted.   Neurologic Exam  Mental status: The patient is alert and oriented x 3 at the time of the examination. The patient has apparent normal recent and remote memory, with an apparently normal attention span and concentration ability.   Cranial nerves: Facial symmetry is present. Speech is normal, no aphasia or dysarthria is noted. Extraocular movements are full. Visual fields are full. Pupils are equal, round, and reactive to light.  Discs are flat bilaterally.  Motor: The patient has good strength in all 4 extremities.  Sensory examination: Soft touch sensation is symmetric on the face, arms, and legs.  Coordination: The patient has good finger-nose-finger and heel-to-shin bilaterally.  Gait and station: The patient has a slightly wide-based gait, but the patient is able to ambulate without assistance.  Romberg is negative.  Reflexes: Deep tendon reflexes are symmetric.   MRI brain 10/28/17:  IMPRESSION: 1. Motion degraded examination. Severe chronic demyelination with a few hyperacute and acute demyelinating plaques. 2. Moderate parenchymal brain volume loss  * MRI scan images were reviewed online. I agree with the written report.    Assessment/Plan:  1.  Multiple sclerosis  2.  Schizophrenia  3.  Glaucoma  The patient has gotten his first 2 treatments of Ocrevus, his next therapy is in October.  This should help his compliance as it requires only 2 injections a year.  The patient has had a recent blood work that is included a comprehensive metabolic profile and CBC.  No blood work will be done today.  He  will follow-up in 6 months.   Marlan Palau MD 11/13/2017 11:52 AM  [facility] [address] [place], [place] [address]  Phone [phone] Fax [phone]

## 2017-12-03 ENCOUNTER — Encounter (HOSPITAL_COMMUNITY): Payer: Self-pay | Admitting: Nurse Practitioner

## 2017-12-03 ENCOUNTER — Telehealth: Payer: Self-pay | Admitting: Neurology

## 2017-12-03 DIAGNOSIS — J069 Acute upper respiratory infection, unspecified: Secondary | ICD-10-CM | POA: Insufficient documentation

## 2017-12-03 DIAGNOSIS — Z7901 Long term (current) use of anticoagulants: Secondary | ICD-10-CM | POA: Diagnosis not present

## 2017-12-03 DIAGNOSIS — Z79899 Other long term (current) drug therapy: Secondary | ICD-10-CM | POA: Diagnosis not present

## 2017-12-03 DIAGNOSIS — I10 Essential (primary) hypertension: Secondary | ICD-10-CM | POA: Diagnosis not present

## 2017-12-03 DIAGNOSIS — R0981 Nasal congestion: Secondary | ICD-10-CM | POA: Insufficient documentation

## 2017-12-03 DIAGNOSIS — R6883 Chills (without fever): Secondary | ICD-10-CM | POA: Diagnosis not present

## 2017-12-03 DIAGNOSIS — R05 Cough: Secondary | ICD-10-CM | POA: Insufficient documentation

## 2017-12-03 DIAGNOSIS — F209 Schizophrenia, unspecified: Secondary | ICD-10-CM

## 2017-12-03 DIAGNOSIS — F1210 Cannabis abuse, uncomplicated: Secondary | ICD-10-CM | POA: Insufficient documentation

## 2017-12-03 DIAGNOSIS — F17210 Nicotine dependence, cigarettes, uncomplicated: Secondary | ICD-10-CM | POA: Insufficient documentation

## 2017-12-03 LAB — BASIC METABOLIC PANEL
Anion gap: 7 (ref 5–15)
BUN: 11 mg/dL (ref 6–20)
CO2: 28 mmol/L (ref 22–32)
Calcium: 9.4 mg/dL (ref 8.9–10.3)
Chloride: 105 mmol/L (ref 101–111)
Creatinine, Ser: 1.11 mg/dL (ref 0.61–1.24)
GFR calc Af Amer: >60 mL/min (ref >60)
GFR calc non Af Amer: >60 mL/min (ref >60)
Glucose, Bld: 97 mg/dL (ref 65–99)
Potassium: 3.3 mmol/L — ABNORMAL LOW (ref 3.5–5.1)
Sodium: 140 mmol/L (ref 135–145)

## 2017-12-03 LAB — URINALYSIS, ROUTINE W REFLEX MICROSCOPIC
Bilirubin Urine: NEGATIVE
Glucose, UA: NEGATIVE mg/dL
Hgb urine dipstick: NEGATIVE
Ketones, ur: NEGATIVE mg/dL
Leukocytes, UA: NEGATIVE
Nitrite: NEGATIVE
Protein, ur: NEGATIVE mg/dL
Specific Gravity, Urine: 1.011 (ref 1.005–1.030)
pH: 7 (ref 5.0–8.0)

## 2017-12-03 LAB — CBC
HCT: 39.4 % (ref 39.0–52.0)
Hemoglobin: 12.3 g/dL — ABNORMAL LOW (ref 13.0–17.0)
MCH: 25.2 pg — ABNORMAL LOW (ref 26.0–34.0)
MCHC: 31.2 g/dL (ref 30.0–36.0)
MCV: 80.7 fL (ref 78.0–100.0)
Platelets: 211 10*3/uL (ref 150–400)
RBC: 4.88 MIL/uL (ref 4.22–5.81)
RDW: 15.4 % (ref 11.5–15.5)
WBC: 7.1 10*3/uL (ref 4.0–10.5)

## 2017-12-03 NOTE — ED Triage Notes (Signed)
Pt brought in by EMS from home with c/o common cold symptoms for about a week  Pt states he has had chills and generalized weakness  Pt has MS  Pt states his speech is more slurred than normal but pt has no neuro deficits noted

## 2017-12-03 NOTE — Telephone Encounter (Signed)
I called and left a message, I will call back later. 

## 2017-12-03 NOTE — Telephone Encounter (Addendum)
Pts mother requesting a referral for a psychiatrist for the pt to be evaluated. Pts mother requesting a call to touch base on the pts prior appts.

## 2017-12-03 NOTE — Telephone Encounter (Signed)
I called the mother.  The patient apparently has never really had any care through a psychiatrist.  He has significant issues with schizophrenia, he does need to be followed by someone, I will try to get a referral set up.

## 2017-12-04 ENCOUNTER — Emergency Department (HOSPITAL_COMMUNITY)
Admission: EM | Admit: 2017-12-04 | Discharge: 2017-12-04 | Disposition: A | Discharge status: Home / Self Care | Payer: Medicare Other | Attending: Emergency Medicine | Admitting: Emergency Medicine

## 2017-12-04 ENCOUNTER — Emergency Department (HOSPITAL_COMMUNITY): Payer: Medicare Other

## 2017-12-04 DIAGNOSIS — J069 Acute upper respiratory infection, unspecified: Secondary | ICD-10-CM

## 2017-12-04 DIAGNOSIS — R6883 Chills (without fever): Secondary | ICD-10-CM

## 2017-12-04 NOTE — ED Provider Notes (Signed)
[facility]-EMERGENCY DEPT Provider Note   CSN: [id number] Arrival date & time: 12/03/17  2020     History   Chief Complaint Chief Complaint  Patient presents with  . URI  . Chills    HPI Brandon [name] is a 27 y.o. male.   27 year old male with a history of ADHD, bipolar 1 disorder, hypertension, schizophrenia, multiple sclerosis presents to the emergency department for evaluation of chills.  Part of the history was confirmed with mother over the phone.  She states that the patient has been experiencing issues with balance secondary to an MS flare.  He is currently on prednisone for this.  He has had upper respiratory symptoms including cough and congestion for approximately 1 week.  The patient began to complain of subjective chills today.  No temperature was taken prior to arrival to assess for fever.  He has not had any vomiting or diarrhea.  No known sick contacts.  Patient, personally, expresses concern for pneumonia.     Past Medical History:  Diagnosis Date  . ADHD (attention deficit hyperactivity disorder)   . Bipolar 1 disorder (HCC)   . Chronic back pain   . Chronic neck pain   . Hypertension   . Multiple sclerosis (HCC) 05/20/2013   left sided weakness, dysarthria  . Non-compliance   . Obesity   . Schizophrenia [name]     Patient Active Problem List   Diagnosis Date Noted  . Generalized weakness   . Pulmonary embolism (HCC) 08/12/2017  . Hyperglycemia 07/31/2017  . Multiple sclerosis exacerbation (HCC) 07/30/2017  . Acute encephalopathy 07/30/2017  . Schizophrenia (HCC)   . Hallucinations 08/05/2014  . Auditory hallucination   . Left-sided weakness 01/18/2014  . Multiple sclerosis (HCC) 05/20/2013  . 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 Abnormality of gait 11/23/2012  . HTN (hypertension) 07/04/2011  . 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 Obesity, Class III, BMI 40-49.9 (morbid obesity) (HCC) 01/14/2010  . ADHD 01/14/2010     Past Surgical History:  Procedure Laterality Date  . None          Home Medications    Prior to Admission medications   Medication Sig Start Date End Date Taking? Authorizing Provider  acetaminophen (TYLENOL) 325 MG tablet Take 2 tablets (650 mg total) by mouth every 6 (six) hours as needed for mild pain or moderate pain (or Fever >/= 101). 11/02/17  Yes Leroy Sea, MD  amLODipine (NORVASC) 10 MG tablet Take 10 mg by mouth daily.   Yes [provider]  ELIQUIS STARTER PACK (ELIQUIS STARTER PACK) 5 MG TABS Take 5 mg by mouth 2 (two) times daily. 11/02/17  Yes Leroy Sea, MD  INVEGA SUSTENNA 234 MG/1.5ML SUSP injection Inject 234 mg into the muscle every 30 (thirty) days.  05/15/17  Yes [provider]  lithium carbonate (LITHOBID) 300 MG CR tablet Take 300 mg by mouth 2 (two) times daily. 11/11/17  Yes [provider]  ocrelizumab in sodium chloride 0.9 % 500 mL 300mg  IV on day one, repeat 300mg  IV on day 15. Then 600mg  IV every 6 months after that 09/02/17  Yes York Spaniel, MD  Oxcarbazepine (TRILEPTAL) 300 MG tablet Take 1 tablet (300 mg total) by mouth 2 (two) times daily. 11/08/17  Yes Charm Rings, NP  paliperidone (INVEGA) 6 MG 24 hr tablet Take 1 tablet (6 mg total) by mouth daily. 11/09/17  Yes Charm Rings, NP  polyethylene glycol [facility] /  GLYCOLAX) packet Take 17 g by mouth 2 (two) times daily. 11/02/17  Yes Leroy Sea, MD  TRAVATAN Z 0.004 % SOLN ophthalmic solution Place 1 drop into both eyes at bedtime. 10/19/17  Yes [provider]  traZODone (DESYREL) 100 MG tablet Take 1 tablet (100 mg total) by mouth at bedtime as needed (agitation). 11/08/17  Yes Charm Rings, NP  propranolol (INDERAL) 10 MG tablet Take 1 tablet (10 mg total) by mouth 2 (two) times daily. Patient not taking: Reported on 11/13/2017 05/03/15   Charm Rings, NP    Family History Family History  Problem Relation Age of Onset  . Diabetes  Mother   . ADD / ADHD Brother     Social History Social History   Tobacco Use  . Smoking status: Current Every Day Smoker    Packs/day: 0.25    Types: Cigarettes  . Smokeless tobacco: Never Used  . Tobacco comment: 4 cigarettes a day  Substance Use Topics  . Alcohol use: Yes    Alcohol/week: 0.0 oz    Comment: "A little bit"   . Drug use: Yes    Types: Marijuana    Comment: Last used: unknown      Allergies   Patient has no known allergies.   Review of Systems Review of Systems Ten systems reviewed and are negative for acute change, except as noted in the HPI.    Physical Exam Updated Vital Signs BP (!) 140/108 (BP Location: Right Arm)   Pulse 80   Temp 98.8 F (37.1 C) (Oral)   Resp 18   SpO2 99%   Physical Exam  Constitutional: He is oriented to person, place, and time. He appears well-developed and well-nourished. No distress.  Nontoxic appearing; obese  HENT:  Head: Normocephalic and atraumatic.  Tolerating secretions without difficulty.  Eyes: Conjunctivae and EOM are normal. No scleral icterus.  Neck: Normal range of motion.  Cardiovascular: Normal rate, regular rhythm and intact distal pulses.  Pulmonary/Chest: Effort normal. No stridor. No respiratory distress. He has no wheezes. He has no rales.  Lungs CTAB  Musculoskeletal: Normal range of motion.  Neurological: He is alert and oriented to person, place, and time.  Skin: Skin is warm and dry. No rash noted. He is not diaphoretic. No erythema. No pallor.  Psychiatric: He has a normal mood and affect. His behavior is normal.  Nursing note and vitals reviewed.    ED Treatments / Results  Labs (all labs ordered are listed, but only abnormal results are displayed) Labs Reviewed  BASIC METABOLIC PANEL - Abnormal; Notable for the following components:      Result Value   Potassium 3.3 (*)    All other components within normal limits  CBC - Abnormal; Notable for the following components:    Hemoglobin 12.3 (*)    MCH 25.2 (*)    All other components within normal limits  URINALYSIS, ROUTINE W REFLEX MICROSCOPIC    EKG None  Radiology Dg Chest 2 View  Result Date: 12/04/2017 CLINICAL DATA:  27 y/o M; 1 week of upper respiratory infection symptoms. EXAM: CHEST - 2 VIEW COMPARISON:  10/31/2017 chest radiograph FINDINGS: Stable heart size and mediastinal contours are within normal limits. Both lungs are clear. The visualized skeletal structures are unremarkable. IMPRESSION: No acute pulmonary process identified. Electronically Signed   By: Mitzi Hansen M.D.   On: 12/04/2017 06:26    Procedures Procedures (including critical care time)  Medications Ordered in ED Medications -  No data to display   Initial Impression / Assessment and Plan / ED Course  I have reviewed the triage vital signs and the nursing notes.  Pertinent labs & imaging results that were available during my care of the patient were reviewed by me and considered in my medical decision making (see chart for details).     Pt CXR negative for acute infiltrate. Patient's symptoms are consistent with URI, likely viral etiology. Discussed that antibiotics are not indicated for viral infections. Pt will be discharged with symptomatic treatment.  He verbalizes understanding and is agreeable with plan. Plan was also discussed with mother over the phone prior to discharge. Return precautions discussed and provided. Patient discharged in stable condition with no unaddressed concerns; discharged in care of brother.   Final Clinical Impressions(s) / ED Diagnoses   Final diagnoses:  Chills (without fever)  Upper respiratory tract infection, unspecified type    ED Discharge Orders    None       Antony Madura, PA-C 12/12/17 1610    Paula Libra, MD 12/17/17 2236

## 2017-12-04 NOTE — Discharge Instructions (Signed)
Your work-up today did not show a pneumonia.  We recommend that you continue with over-the-counter medications for management of likely viral illness.  Follow-up with your primary care doctor to ensure resolution of symptoms.  You may return if symptoms persist or worsen.

## 2017-12-14 NOTE — Telephone Encounter (Signed)
[name] is calling back regarding a referral that was to be sent to a psychiatrist.

## 2017-12-15 NOTE — Telephone Encounter (Signed)
Called and Patient's to patient's mother called and gave her the telephone number if she has any problems she will call me back.

## 2017-12-28 ENCOUNTER — Telehealth: Payer: Self-pay | Admitting: Neurology

## 2017-12-28 DIAGNOSIS — G35 Multiple sclerosis: Secondary | ICD-10-CM

## 2017-12-28 MED ORDER — PREDNISONE 10 MG PO TABS: ORAL_TABLET | ORAL | 0 refills | Status: DC

## 2017-12-28 NOTE — Telephone Encounter (Signed)
Dr. Anne Hahn, is this something that we can order for patient?

## 2017-12-28 NOTE — Telephone Encounter (Addendum)
Pts mother([name]) called stating around 5:30am Saturday morning the pt fell, and had to get EMT to help pt up. Pts mother requesting a call would like to discuss sending in PT orders to hep the pt with his strength. Stating that as of right now the pt has been struggling to sit up and once pt starts to stand he legs shake causing him to fall. Please call to advise.

## 2017-12-28 NOTE — Addendum Note (Signed)
Addended by: York Spaniel on: 12/28/2017 04:51 PM   Modules accepted: Orders

## 2017-12-28 NOTE — Telephone Encounter (Signed)
I called the patient.  I talk with the mother.  The patient recently has had difficulty with walking.  She does not believe she can get him out of bed to get him here to get blood work done, there is a Engineer, civil (consulting) that comes out and monitors the lithium levels and they can draw blood and send it to [place].  We will get a comprehensive metabolic profile, CBC, lithium level, Trileptal level, and if possible a urinalysis.  I will send in a prednisone Dosepak now, if he does not improve soon we may have to convert to methylprednisolone IV.

## 2017-12-28 NOTE — Telephone Encounter (Signed)
I called the mother, left a message, I will call back later.  The patient may need to come in for blood work and urinalysis, check a Trileptal level and lithium level to make sure that there are no issues with medications that are causing the walking problem.

## 2017-12-29 ENCOUNTER — Ambulatory Visit: Payer: Medicare Other | Admitting: Neurology

## 2018-01-01 NOTE — Addendum Note (Signed)
Addended by: Geronimo Running A on: 01/01/2018 01:53 PM   Modules accepted: Orders

## 2018-01-01 NOTE — Telephone Encounter (Signed)
I will go ahead and set up another order for physical and occupational therapy out to the house.

## 2018-01-01 NOTE — Addendum Note (Signed)
Addended by: York Spaniel on: 01/01/2018 01:45 PM   Modules accepted: Orders

## 2018-01-01 NOTE — Telephone Encounter (Signed)
Received VO from Dr. Anne Hahn for labs to also be drawn with [facility]'s help. Order placed.

## 2018-01-01 NOTE — Telephone Encounter (Signed)
Pt mother [name] requesting a call, stating she hasn't heard anything from [place]. Stating she would like the pt to have more PT. Please call to advise

## 2018-01-01 NOTE — Telephone Encounter (Signed)
Called [place] about  lab orders . Dr. Anne Hahn you will need to do a new referral for PT / lab draw  Patient's session ended 12/10/2017 . I will get labs faxed to [employer].   I spoke with [facility].  [phone].

## 2018-01-01 NOTE — Telephone Encounter (Signed)
Brandon [name], please contact [facility] and confirm that they are able to draw pt's labs as ordered by Dr. Anne Hahn on 12/28/17. "We will get a comprehensive metabolic profile, CBC, lithium level, Trileptal level, and if possible a urinalysis." These results will need to be faxed to our office for Dr. Anne Hahn to review.

## 2018-01-04 NOTE — Telephone Encounter (Signed)
Sent orders to [facility] order / Labs Telephone [phone] - fax [phone].

## 2018-01-06 ENCOUNTER — Telehealth: Payer: Self-pay | Admitting: Neurology

## 2018-01-06 NOTE — Telephone Encounter (Signed)
I called Lawson Fiscal, RN back with [employer]. Advised we received lab results from psychotherapuetic services. Labs done included: CBC,CMP and lithium level.  Pt still needs trileptal level and UA. They will try to complete this on Friday 01/08/18. Mother reports that pt is a hard stick.

## 2018-01-06 NOTE — Telephone Encounter (Signed)
Lawson Fiscal, RN with [facility] called back. She states mother will not be there Friday for son to get UA and lab draw. She wants to be there for tests. Declining for them to come Friday. She wants to know when Dr. Anne Hahn would like for them to go for tests. Advised I will speak with him and call back to let her know. She verbalized understanding.

## 2018-01-06 NOTE — Telephone Encounter (Addendum)
Received call from Joannie Springs RN w/[facility]. She is at the patient's home and asking what Dr Anne Hahn needs done. This RN read the referrals from 01/01/18. Lawson Fiscal stated that the Act team took labs on 01/01/18, no UA done. She then stated the ACT nurse spoke to Dr Anne Hahn. This RN asked if results were faxed to Dr Anne Hahn. Lawson Fiscal does not know.  This RN advised that Lawson Fiscal needs to speak with Dr Clarisa Kindred RN, Kara Mead to clarify what may need to be done. Lori verbalized understanding.

## 2018-01-06 NOTE — Telephone Encounter (Signed)
I called the patient's mother.  The blood work that was done on 27 June showed a high lithium level of 1.7.  The lithium apparently has been discontinued.  His white blood count is elevated at 12.0, hemoglobin of 13.0, hematocrit 40.0, MCV of 80, platelets of 203.  Glucose is 118, BUN 12, creatinine of 0.01, sodium 138, potassium 4.3, chloride 103, CO2 21, calcium 9.5, total protein 6.0, albumin 4.4, liver profile was unremarkable.  The lithium toxicity has likely been a part of why he has not been able to walk well, he is now on prednisone, this is also helping, he is starting to take a step or 2 but his balance remains quite unstable.  The patient will be getting physical therapy and home environment.  The urinalysis will hopefully be done in the near future.

## 2018-01-06 NOTE — Telephone Encounter (Addendum)
Called and spoke with Joannie Springs, RN with [facility]. Advised pt needed the following labs drawn: CMP,CBC, lithium level and trileptal level. Also needed UA. She states mother told her pt had labs completed with ACT nurse via psychotherapeutic services. Phone: [phone]. Fax: [phone].   She will schedule visit for 7/5 to come back to do UA. She wanted to know if she could authorize a couple more nursing visits to monitor pt. I placed her on hold to speak with Dr. Anne Hahn. He gave VO for a couple more nursing visits. He also states he spoke with nurse via psychiatry and believes they only checked lithium level. I relayed this to Lawson Fiscal, Charity fundraiser. She asked I call her back if they only did lithium level and they will add other lab orders to be drawn.

## 2018-01-06 NOTE — Telephone Encounter (Signed)
I called Lawson Fiscal back and advised per CW,MD to get lab work and UA on 01/11/18. She verbalized understanding.

## 2018-01-11 ENCOUNTER — Telehealth: Payer: Self-pay | Admitting: *Deleted

## 2018-01-11 ENCOUNTER — Telehealth: Payer: Self-pay | Admitting: Neurology

## 2018-01-11 NOTE — Telephone Encounter (Signed)
Spoke w/ Dr. Terrace Arabia. Dr. Anne Hahn out of office. She would like them to re-try getting trileptal level and UA this week.

## 2018-01-11 NOTE — Telephone Encounter (Signed)
Called Jacki Cones back. LVM letting her know per Dr. Terrace Arabia that she would like them to re-try getting labs/UA this week. Dr Anne Hahn wanted these.

## 2018-01-11 NOTE — Telephone Encounter (Signed)
Vernona Rieger with [facility] calling to advise she could not get a urine specimen or blood draw on the patient. Does she need to try again this week? Please call and discuss. She can be reached at [phone]. I advised Dr. Anne Hahn is out of the office.

## 2018-01-11 NOTE — Telephone Encounter (Signed)
Faxed completed/signed Tysabri discontinuation questionnaire to  [facility]. Fax:  [phone]. Received fax confirmation.

## 2018-01-18 ENCOUNTER — Encounter: Payer: Self-pay | Admitting: Neurology

## 2018-01-20 ENCOUNTER — Telehealth: Payer: Self-pay | Admitting: *Deleted

## 2018-01-20 NOTE — Telephone Encounter (Signed)
Called mother. Relayed that UA normal per Dr. Marjory Lies. Trileptal level came back as none detected. Mother does not recall getting a prescription for Trileptal. I checked Epic and appears rx was printed on 11/08/17 by Nanine Means, NP. She does not recall this. He is not taking this medication. Advised I will speak with Dr. Marjory Lies and call back to advise.

## 2018-01-22 NOTE — Telephone Encounter (Signed)
They should follow up with psychiatry re: trileptal level and medication. -VRP

## 2018-01-25 ENCOUNTER — Other Ambulatory Visit: Payer: Self-pay

## 2018-01-25 ENCOUNTER — Encounter (HOSPITAL_COMMUNITY): Payer: Self-pay | Admitting: Emergency Medicine

## 2018-01-25 ENCOUNTER — Emergency Department (HOSPITAL_COMMUNITY): Payer: Medicare Other

## 2018-01-25 ENCOUNTER — Emergency Department (HOSPITAL_COMMUNITY)
Admission: EM | Admit: 2018-01-25 | Discharge: 2018-01-26 | Disposition: A | Discharge status: Home / Self Care | Payer: Medicare Other | Attending: Emergency Medicine | Admitting: Emergency Medicine

## 2018-01-25 DIAGNOSIS — G4489 Other headache syndrome: Secondary | ICD-10-CM | POA: Diagnosis not present

## 2018-01-25 DIAGNOSIS — R51 Headache: Secondary | ICD-10-CM | POA: Diagnosis present

## 2018-01-25 DIAGNOSIS — G35D Multiple sclerosis, unspecified: Secondary | ICD-10-CM

## 2018-01-25 DIAGNOSIS — F209 Schizophrenia, unspecified: Secondary | ICD-10-CM | POA: Insufficient documentation

## 2018-01-25 DIAGNOSIS — Z7901 Long term (current) use of anticoagulants: Secondary | ICD-10-CM | POA: Diagnosis not present

## 2018-01-25 DIAGNOSIS — F319 Bipolar disorder, unspecified: Secondary | ICD-10-CM | POA: Diagnosis not present

## 2018-01-25 DIAGNOSIS — Z79899 Other long term (current) drug therapy: Secondary | ICD-10-CM | POA: Insufficient documentation

## 2018-01-25 DIAGNOSIS — F17210 Nicotine dependence, cigarettes, uncomplicated: Secondary | ICD-10-CM | POA: Insufficient documentation

## 2018-01-25 DIAGNOSIS — Z8673 Personal history of transient ischemic attack (TIA), and cerebral infarction without residual deficits: Secondary | ICD-10-CM | POA: Insufficient documentation

## 2018-01-25 DIAGNOSIS — G35 Multiple sclerosis: Secondary | ICD-10-CM | POA: Insufficient documentation

## 2018-01-25 DIAGNOSIS — I10 Essential (primary) hypertension: Secondary | ICD-10-CM | POA: Insufficient documentation

## 2018-01-25 DIAGNOSIS — R42 Dizziness and giddiness: Secondary | ICD-10-CM

## 2018-01-25 DIAGNOSIS — R519 Headache, unspecified: Secondary | ICD-10-CM

## 2018-01-25 DIAGNOSIS — Z8669 Personal history of other diseases of the nervous system and sense organs: Secondary | ICD-10-CM

## 2018-01-25 DIAGNOSIS — F909 Attention-deficit hyperactivity disorder, unspecified type: Secondary | ICD-10-CM | POA: Diagnosis not present

## 2018-01-25 HISTORY — DX: Cerebral infarction, unspecified: I63.9

## 2018-01-25 LAB — URINALYSIS, COMPLETE (UACMP) WITH MICROSCOPIC
Bilirubin Urine: NEGATIVE
Glucose, UA: NEGATIVE mg/dL
Hgb urine dipstick: NEGATIVE
Ketones, ur: NEGATIVE mg/dL
Leukocytes, UA: NEGATIVE
Nitrite: NEGATIVE
Protein, ur: NEGATIVE mg/dL
Specific Gravity, Urine: 1.018 (ref 1.005–1.030)
pH: 6 (ref 5.0–8.0)

## 2018-01-25 LAB — COMPREHENSIVE METABOLIC PANEL
ALT: 26 U/L (ref 0–44)
AST: 14 U/L — ABNORMAL LOW (ref 15–41)
Albumin: 4.1 g/dL (ref 3.5–5.0)
Alkaline Phosphatase: 59 U/L (ref 38–126)
Anion gap: 9 (ref 5–15)
BUN: 18 mg/dL (ref 6–20)
CO2: 26 mmol/L (ref 22–32)
Calcium: 9.4 mg/dL (ref 8.9–10.3)
Chloride: 107 mmol/L (ref 98–111)
Creatinine, Ser: 1.2 mg/dL (ref 0.61–1.24)
GFR calc Af Amer: >60 mL/min (ref >60)
GFR calc non Af Amer: >60 mL/min (ref >60)
Glucose, Bld: 92 mg/dL (ref 70–99)
Potassium: 4.1 mmol/L (ref 3.5–5.1)
Sodium: 142 mmol/L (ref 135–145)
Total Bilirubin: 0.7 mg/dL (ref 0.3–1.2)
Total Protein: 6.6 g/dL (ref 6.5–8.1)

## 2018-01-25 LAB — CBC
HCT: 43.8 % (ref 39.0–52.0)
Hemoglobin: 13.3 g/dL (ref 13.0–17.0)
MCH: 25.4 pg — ABNORMAL LOW (ref 26.0–34.0)
MCHC: 30.4 g/dL (ref 30.0–36.0)
MCV: 83.7 fL (ref 78.0–100.0)
Platelets: 160 10*3/uL (ref 150–400)
RBC: 5.23 MIL/uL (ref 4.22–5.81)
RDW: 14.5 % (ref 11.5–15.5)
WBC: 5.1 10*3/uL (ref 4.0–10.5)

## 2018-01-25 LAB — RAPID URINE DRUG SCREEN, HOSP PERFORMED
Amphetamines: NOT DETECTED
Barbiturates: NOT DETECTED
Benzodiazepines: NOT DETECTED
Cocaine: NOT DETECTED
Opiates: NOT DETECTED
Tetrahydrocannabinol: NOT DETECTED

## 2018-01-25 LAB — APTT: aPTT: 29 seconds (ref 24–36)

## 2018-01-25 LAB — I-STAT CHEM 8, ED
BUN: 22 mg/dL — ABNORMAL HIGH (ref 6–20)
Calcium, Ion: 1.2 mmol/L (ref 1.15–1.40)
Chloride: 105 mmol/L (ref 98–111)
Creatinine, Ser: 1.1 mg/dL (ref 0.61–1.24)
Glucose, Bld: 89 mg/dL (ref 70–99)
HCT: 43 % (ref 39.0–52.0)
Hemoglobin: 14.6 g/dL (ref 13.0–17.0)
Potassium: 4.1 mmol/L (ref 3.5–5.1)
Sodium: 141 mmol/L (ref 135–145)
TCO2: 26 mmol/L (ref 22–32)

## 2018-01-25 LAB — DIFFERENTIAL
Abs Immature Granulocytes: 0 10*3/uL (ref 0.0–0.1)
Basophils Absolute: 0 10*3/uL (ref 0.0–0.1)
Basophils Relative: 0 %
Eosinophils Absolute: 0 10*3/uL (ref 0.0–0.7)
Eosinophils Relative: 1 %
Immature Granulocytes: 0 %
Lymphocytes Relative: 28 %
Lymphs Abs: 1.4 10*3/uL (ref 0.7–4.0)
Monocytes Absolute: 0.4 10*3/uL (ref 0.1–1.0)
Monocytes Relative: 7 %
Neutro Abs: 3.3 10*3/uL (ref 1.7–7.7)
Neutrophils Relative %: 64 %

## 2018-01-25 LAB — AMMONIA: Ammonia: 36 umol/L — ABNORMAL HIGH (ref 9–35)

## 2018-01-25 LAB — CBG MONITORING, ED: Glucose-Capillary: 86 mg/dL (ref 70–99)

## 2018-01-25 LAB — I-STAT TROPONIN, ED: Troponin i, poc: 0 ng/mL (ref 0.00–0.08)

## 2018-01-25 LAB — ETHANOL: Alcohol, Ethyl (B): <10 mg/dL (ref <10)

## 2018-01-25 LAB — PROTIME-INR
INR: 1.23
Prothrombin Time: 15.4 seconds — ABNORMAL HIGH (ref 11.4–15.2)

## 2018-01-25 LAB — LITHIUM LEVEL: Lithium Lvl: <0.06 mmol/L — ABNORMAL LOW (ref 0.60–1.20)

## 2018-01-25 MED ORDER — DIPHENHYDRAMINE HCL 50 MG/ML IJ SOLN
12.5000 mg | Freq: Once | INTRAMUSCULAR | Status: AC
  Administered 2018-01-25: 12.5 mg via INTRAVENOUS
  Filled 2018-01-25: qty 1

## 2018-01-25 MED ORDER — ONDANSETRON HCL 4 MG/2ML IJ SOLN
4.0000 mg | Freq: Once | INTRAMUSCULAR | Status: AC
  Administered 2018-01-25: 4 mg via INTRAVENOUS
  Filled 2018-01-25 (×2): qty 2

## 2018-01-25 MED ORDER — SODIUM CHLORIDE 0.9 % IV BOLUS
1000.0000 mL | Freq: Once | INTRAVENOUS | Status: AC
  Administered 2018-01-25: 1000 mL via INTRAVENOUS

## 2018-01-25 MED ORDER — ACETAMINOPHEN 500 MG PO TABS
1000.0000 mg | ORAL_TABLET | Freq: Once | ORAL | Status: AC
  Administered 2018-01-25: 1000 mg via ORAL
  Filled 2018-01-25 (×2): qty 2

## 2018-01-25 NOTE — ED Notes (Signed)
Per PA, NPO at this time until further eval

## 2018-01-25 NOTE — ED Notes (Signed)
Patient transported to MRI 

## 2018-01-25 NOTE — ED Notes (Signed)
Patient transported to CT 

## 2018-01-25 NOTE — Telephone Encounter (Signed)
Called, LVM for mother to call office. 

## 2018-01-25 NOTE — ED Provider Notes (Signed)
[facility] EMERGENCY DEPARTMENT Provider Note   CSN: [id number] Arrival date & time: 01/25/18  1631     History   Chief Complaint Chief Complaint  Patient presents with  . Headache    HPI Brandon [name] is a 27 y.o. 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 LEVEL 5 CAVEAT DUE TO POOR HISTORIAN VS AMS, very difficult to get much of a story from him about what his presenting complaints are.  He states that he has had black and white spots in his vision and lightheadedness for 2 weeks as well as a migraine headache which he describes as 9/10 constant sharp pain in both temples and along the crown of his head, radiating to his neck, which worsens with activity, and with no treatments tried prior to arrival.  He states that today he had one episode of nonbloody nonbilious emesis with associated nausea.  He also mentions that he is had generalized weakness for "a while".  He follows with Dr. Anne Hahn for neurology.  He is not sure what he is on for MS or when the last time he had medications for MS.  Chart review reveals that he was previously on Tysabri and then had significant MS exacerbation symptoms when he came off of it, he was then on Ocrevus starting in Feb 2019 (per notes from Dr. Anne Hahn on 11/13/17, his next treatment would be in October).  It's unclear if he's supposed to be on anything currently, or if he's taking anything since the pt can't remember.  He denies fevers, chills, URI symptoms/rhinorrhea/congestion, CP, SOB, abd pain, hematemesis, diarrhea, constipation, melena, hematochezia, hematuria, dysuria, numbness, tingling, focal unilateral weakness, diplopia, blurred vision, or any other complaints at  this time however his ROS is limited due to his confusion/difficult historian state.   The history is provided by medical records and the patient. The history is limited by the absence of a caregiver. No language interpreter was used.  Headache   Associated symptoms include nausea. Pertinent negatives include no fever, no shortness of breath and no vomiting.    Past Medical History:  Diagnosis Date  . ADHD (attention deficit hyperactivity disorder)   . Bipolar 1 disorder (HCC)   . Chronic back pain   . Chronic neck pain   . Hypertension   . Multiple sclerosis (HCC) 05/20/2013   left sided weakness, dysarthria  . Non-compliance   . Obesity   . Schizophrenia (HCC)   . Stroke [facility])    left sided deficits    Patient Active Problem List   Diagnosis Date Noted  . Generalized weakness   . Pulmonary embolism (HCC) 08/12/2017  . Hyperglycemia 07/31/2017  . Multiple sclerosis exacerbation (HCC) 07/30/2017  . Acute encephalopathy 07/30/2017  . Schizophrenia (HCC)   . Hallucinations 08/05/2014  . Auditory hallucination   . Left-sided weakness 01/18/2014  . Multiple sclerosis (HCC) 05/20/2013  . 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 Abnormality of gait 11/23/2012  . HTN (hypertension) 07/04/2011  . 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 Obesity, Class III, BMI 40-49.9 (morbid obesity) (HCC) 01/14/2010  . ADHD 01/14/2010    Past Surgical History:  Procedure Laterality Date  . None  Home Medications    Prior to Admission medications   Medication Sig Start Date End Date Taking? Authorizing Provider  acetaminophen (TYLENOL) 325 MG tablet Take 2 tablets (650 mg total) by mouth every 6 (six) hours as needed for mild pain or moderate pain (or Fever >/= 101). 11/02/17   Leroy Sea, MD  amLODipine (NORVASC) 10 MG tablet Take 10 mg by mouth daily.    [provider]  ELIQUIS STARTER PACK (ELIQUIS STARTER PACK) 5 MG TABS Take 5 mg by mouth 2 (two) times daily.  11/02/17   Leroy Sea, MD  INVEGA SUSTENNA 234 MG/1.5ML SUSP injection Inject 234 mg into the muscle every 30 (thirty) days.  05/15/17   [provider]  lithium carbonate (LITHOBID) 300 MG CR tablet Take 300 mg by mouth 2 (two) times daily. 11/11/17   [provider]  ocrelizumab in sodium chloride 0.9 % 500 mL 300mg  IV on day one, repeat 300mg  IV on day 15. Then 600mg  IV every 6 months after that 09/02/17   York Spaniel, MD  Oxcarbazepine (TRILEPTAL) 300 MG tablet Take 1 tablet (300 mg total) by mouth 2 (two) times daily. 11/08/17   Charm Rings, NP  paliperidone (INVEGA) 6 MG 24 hr tablet Take 1 tablet (6 mg total) by mouth daily. 11/09/17   Charm Rings, NP  polyethylene glycol (MIRALAX / Ethelene Hal) packet Take 17 g by mouth 2 (two) times daily. 11/02/17   Leroy Sea, MD  predniSONE (DELTASONE) 10 MG tablet Begin taking 6 tablets daily, taper by one tablet every other day until off the medication. 12/28/17   York Spaniel, MD  propranolol (INDERAL) 10 MG tablet Take 1 tablet (10 mg total) by mouth 2 (two) times daily. Patient not taking: Reported on 11/13/2017 05/03/15   Charm Rings, NP  TRAVATAN Z 0.004 % SOLN ophthalmic solution Place 1 drop into both eyes at bedtime. 10/19/17   [provider]  traZODone (DESYREL) 100 MG tablet Take 1 tablet (100 mg total) by mouth at bedtime as needed (agitation). Patient taking differently: Take 150 mg by mouth at bedtime as needed (agitation).  11/08/17   Charm Rings, NP    Family History Family History  Problem Relation Age of Onset  . Diabetes Mother   . ADD / ADHD Brother     Social History Social History   Tobacco Use  . Smoking status: Current Every Day Smoker    Packs/day: 0.25    Types: Cigarettes  . Smokeless tobacco: Never Used  . Tobacco comment: 4 cigarettes a day  Substance Use Topics  . Alcohol use: Yes    Alcohol/week: 0.0 oz    Comment: "A little bit"   . Drug use: Yes     Types: Marijuana    Comment: Last used: unknown      Allergies   Patient has no known allergies.   Review of Systems Review of Systems  Constitutional: Negative for chills and fever.  HENT: Negative for congestion and rhinorrhea.   Eyes: Positive for visual disturbance.  Respiratory: Negative for shortness of breath.   Cardiovascular: Negative for chest pain.  Gastrointestinal: Positive for nausea. Negative for abdominal pain, blood in stool, constipation, diarrhea and vomiting.  Genitourinary: Negative for dysuria and hematuria.  Musculoskeletal: Positive for neck pain.  Allergic/Immunologic: Negative for immunocompromised state.  Neurological: Positive for weakness (generalized weakness for "a while"), light-headedness and headaches. Negative for numbness.  Hematological: Bruises/bleeds easily (on eliquis).  LEVEL 5 CAVEAT DUE TO POOR HISTORIAN VS AMS   Physical Exam Updated Vital Signs BP 109/84 (BP Location: Right Arm)   Pulse 98   Temp 98.4 F (36.9 C) (Oral)   Resp 18   Ht 5\' 11"  (1.803 m)   Wt 127.5 kg (281 lb)   SpO2 100%   BMI 39.19 kg/m   Physical Exam  Constitutional: Vital signs are normal. He appears well-developed and well-nourished.  Non-toxic appearance. No distress.  Afebrile, nontoxic, NAD, dysarthric speech, very difficult to understand due to somewhat garbled speech, slow to respond. Appears to get distracted, laughs and smiles intermittently during exam  HENT:  Head: Normocephalic and atraumatic.  Nose: Nose normal.  Mouth/Throat: Oropharynx is clear and moist and mucous membranes are normal.  No facial droop or asymmetry Nose clear  Eyes: Pupils are equal, round, and reactive to light. Conjunctivae and EOM are normal. Right eye exhibits no discharge. Left eye exhibits no discharge.  PERRL, EOMI, no nystagmus although he doesn't follow an object for very long when performing eye movements so it's hard to tell if he's having any nystagmus  Neck:  Normal range of motion. Neck supple. Muscular tenderness present. No spinous process tenderness present. No neck rigidity. Normal range of motion present.  FROM intact without spinous process TTP, no bony stepoffs or deformities, with diffuse b/l paraspinous muscle TTP without muscle spasms. No rigidity or meningeal signs. No bruising or swelling.   Cardiovascular: Normal rate, regular rhythm, normal heart sounds and intact distal pulses. Exam reveals no gallop and no friction rub.  No murmur heard. Pulmonary/Chest: Effort normal and breath sounds normal. No respiratory distress. He has no decreased breath sounds. He has no wheezes. He has no rhonchi. He has no rales.  Abdominal: Soft. Normal appearance and bowel sounds are normal. He exhibits no distension. There is no tenderness. There is no rigidity, no rebound, no guarding, no CVA tenderness, no tenderness at McBurney's point and negative Murphy's sign.  Musculoskeletal: Normal range of motion.  MAE x4 Strength symmetric bilaterally without appreciable weakness however exam limited due to pt's difficulty following commands for very long; sensation grossly intact in all extremities Distal pulses intact Gait not assessed due to concern for safety  Neurological: He is alert. He has normal strength. No cranial nerve deficit or sensory deficit. Coordination normal. GCS eye subscore is 4. GCS verbal subscore is 4. GCS motor subscore is 5.  CN 2-12 grossly intact although exam limited slightly due to pt's difficulty following commands for very long Speech dysarthric and somewhat garbled/slow to respond A&O to person and place but not really to time (knows president, but doesn't know month/year) GCS 13 due to somewhat confused verbal response and difficulty following commands for very long Sensation and strength grossly intact as mentioned above Gait not assessed Coordination with finger-to-nose WNL Neg pronator drift   Skin: Skin is warm, dry and  intact. No rash noted.  Psychiatric: His affect is inappropriate.  Laughs and smiles intermittently, somewhat inappropriately.   Nursing note and vitals reviewed.  ORTHOSTATICS: Orthostatic VS for the past 24 hrs:  BP- Lying Pulse- Lying BP- Sitting Pulse- Sitting BP- Standing at 0 minutes Pulse- Standing at 0 minutes  01/25/18 2042 118/66 57 124/73 80 114/71 87     ED Treatments / Results  Labs (all labs ordered are listed, but only abnormal results are displayed) Labs Reviewed  PROTIME-INR - Abnormal; Notable for the following components:      Result Value  Prothrombin Time 15.4 (*)    All other components within normal limits  CBC - Abnormal; Notable for the following components:   MCH 25.4 (*)    All other components within normal limits  COMPREHENSIVE METABOLIC PANEL - Abnormal; Notable for the following components:   AST 14 (*)    All other components within normal limits  URINALYSIS, COMPLETE (UACMP) WITH MICROSCOPIC - Abnormal; Notable for the following components:   Bacteria, UA RARE (*)    All other components within normal limits  AMMONIA - Abnormal; Notable for the following components:   Ammonia 36 (*)    All other components within normal limits  LITHIUM LEVEL - Abnormal; Notable for the following components:   Lithium Lvl <0.06 (*)    All other components within normal limits  I-STAT CHEM 8, ED - Abnormal; Notable for the following components:   BUN 22 (*)    All other components within normal limits  APTT  DIFFERENTIAL  RAPID URINE DRUG SCREEN, HOSP PERFORMED  ETHANOL  I-STAT TROPONIN, ED  CBG MONITORING, ED    EKG EKG Interpretation  Date/Time:  Monday January 25 2018 16:51:28 EDT Ventricular Rate:  64 PR Interval:  114 QRS Duration: 92 QT Interval:  382 QTC Calculation: 394 R Axis:   13 Text Interpretation:  Normal sinus rhythm Normal ECG Confirmed by Jacalyn Lefevre [phone]) on 01/25/2018 7:12:37 PM   Radiology Ct Head Wo Contrast  Result  Date: 01/25/2018 CLINICAL DATA:  Headache.  History of multiple sclerosis. EXAM: CT HEAD WITHOUT CONTRAST TECHNIQUE: Contiguous axial images were obtained from the base of the skull through the vertex without intravenous contrast. COMPARISON:  10/29/2017 FINDINGS: Brain: Low-density areas throughout the deep white matter are stable. Mild cerebral volume loss. No acute intracranial abnormality. Specifically, no hemorrhage, hydrocephalus, mass lesion, acute infarction, or significant intracranial injury. Vascular: No hyperdense vessel or unexpected calcification. Skull: No acute calvarial abnormality. Sinuses/Orbits: Visualized paranasal sinuses and mastoids clear. Orbital soft tissues unremarkable. Other: None IMPRESSION: Stable low-density areas throughout the deep white matter compatible with demyelinating process/multiple sclerosis. No acute intracranial abnormality. Electronically Signed   By: Charlett Nose M.D.   On: 01/25/2018 19:10    Procedures Procedures (including critical care time)  Medications Ordered in ED Medications  diphenhydrAMINE (BENADRYL) injection 12.5 mg (has no administration in time range)  sodium chloride 0.9 % bolus 1,000 mL (1,000 mLs Intravenous New Bag/Given 01/25/18 2005)  ondansetron (ZOFRAN) injection 4 mg (4 mg Intravenous Given 01/25/18 2006)  acetaminophen (TYLENOL) tablet 1,000 mg (1,000 mg Oral Given 01/25/18 1856)     Initial Impression / Assessment and Plan / ED Course  I have reviewed the triage vital signs and the nursing notes.  Pertinent labs & imaging results that were available during my care of the patient were reviewed by me and considered in my medical decision making (see chart for details).     27 y.o. male here with HA, lightheadedness, vision changes x2 wks; pt very difficult to understand, has dysarthria and somewhat garbled speech, difficulty historian. Also reports n/v this morning. On exam, he has dysarthric speech, very slow to respond, seems  distracted at times, laughs and smiles intermittently throughout exam, no abdominal tenderness, diffuse paracervical muscle TTP but no midline spinal TTP, all extremities with symmetric strength, NVI with soft compartments, oriented to person and place but not really to time (thinks it's Aug 2017 but knows President), otherwise no focal neuro deficits although did not ambulate the pt due to  concern for safety. Work up thus far reveals: CBG 86, INR WNL, APTT WNL, CBC w/diff WNL, CMP WNL, trop neg, EKG unremarkable and nonischemic. Awaiting head CT. Pt had similar presentation in 10/2017, was admitted for MS exacerbation and schizophrenia. Hard to differentiate what exactly might be going on, will get orthostatics, EtOH level, Ammonia, lithium level, UDS, U/A, and await head CT. Will get MRI brain. Will give tylenol (per pt request) for headache, as well as fluids and zofran. Will hold off on neurology consult for now, until we have rest of labs/imaging results. Discussed case with my attending Dr. Particia Nearing who agrees with plan.   9:46 PM Orthostatics marginally positive (HR increases by 30bpm from laying to standing), fluids running. EtOH level undetectable. Ammonia level marginally elevated at 36. Lithium level low (possible that pt isn't on this anymore). UDS pending. U/A slightly contaminated but without evidence of UTI. Head CT with stable low-density areas throughout deep white matter compatible with demyelinating process/multiple sclerosis, no acute intracranial abnormality. Brain MRI still pending. Pt states his headache continues despite tylenol; want to avoid NSAIDs since he's on blood thinner, want to avoid narcotics in order to avoid making him more confused/altered/sleepy. Will try small dose of benadryl to help with headache. Will continue to monitor and reassess shortly.   10:13 PM UDS negative. Brain MRI pending. Patient care to be resumed by Sharilyn Sites, PA-C at shift change sign-out. Patient  history has been discussed with midlevel resuming care. Plan is to await brain MRI, if no acute findings then he could reasonably be discharged home if improved vs admission for AMS/confusion if still having confusion; however if acute findings, consider consulting neurology and potentially admitting patient for MS exacerbation. Please see their notes for further documentation of pending results and dispo/care. Pt stable at sign-out and updated on transfer of care.    Final Clinical Impressions(s) / ED Diagnoses   Final diagnoses:  Headache disorder  Lightheadedness  History of multiple sclerosis    ED Discharge Orders    [address] [place], [place] 01/25/18 2213    Jacalyn Lefevre, MD 01/26/18 [phone]

## 2018-01-25 NOTE — ED Provider Notes (Signed)
Assumed care from [address] at shift change. See prior notes for full H&P.  Briefly, 27 y.o. M with hx of MS here with 2 weeks lightheadedness, headache, black and white spots in the eyes, new vomiting today.   History of dysarthria and left sided weakness.  AAOx2, confusion about time but unsure if this is baseline.  Is anticoagulated chronically due to history of PE.  Screening labs and head CT overall reassuring.  History somewhat limited here today, however similar presentation in April 2019, admitted for exacerbation of MS at that time.  Plan:  MRI w/ and w/out pending.  Will follow-up on results, monitor improvement.  Results for orders placed or performed during the hospital encounter of 01/25/18  Protime-INR  Result Value Ref Range   Prothrombin Time 15.4 (H) 11.4 - 15.2 seconds   INR 1.23   APTT  Result Value Ref Range   aPTT 29 24 - 36 seconds  CBC  Result Value Ref Range   WBC 5.1 4.0 - 10.5 K/uL   RBC 5.23 4.22 - 5.81 MIL/uL   Hemoglobin 13.3 13.0 - 17.0 g/dL   HCT 16.1 09.6 - 04.5 %   MCV 83.7 78.0 - 100.0 fL   MCH 25.4 (L) 26.0 - 34.0 pg   MCHC 30.4 30.0 - 36.0 g/dL   RDW 40.9 81.1 - 91.4 %   Platelets 160 150 - 400 K/uL  Differential  Result Value Ref Range   Neutrophils Relative % 64 %   Neutro Abs 3.3 1.7 - 7.7 K/uL   Lymphocytes Relative 28 %   Lymphs Abs 1.4 0.7 - 4.0 K/uL   Monocytes Relative 7 %   Monocytes Absolute 0.4 0.1 - 1.0 K/uL   Eosinophils Relative 1 %   Eosinophils Absolute 0.0 0.0 - 0.7 K/uL   Basophils Relative 0 %   Basophils Absolute 0.0 0.0 - 0.1 K/uL   Immature Granulocytes 0 %   Abs Immature Granulocytes 0.0 0.0 - 0.1 K/uL  Comprehensive metabolic panel  Result Value Ref Range   Sodium 142 135 - 145 mmol/L   Potassium 4.1 3.5 - 5.1 mmol/L   Chloride 107 98 - 111 mmol/L   CO2 26 22 - 32 mmol/L   Glucose, Bld 92 70 - 99 mg/dL   BUN 18 6 - 20 mg/dL   Creatinine, Ser 7.82 0.61 - 1.24 mg/dL   Calcium 9.4 8.9 - 95.6 mg/dL   Total Protein  6.6 6.5 - 8.1 g/dL   Albumin 4.1 3.5 - 5.0 g/dL   AST 14 (L) 15 - 41 U/L   ALT 26 0 - 44 U/L   Alkaline Phosphatase 59 38 - 126 U/L   Total Bilirubin 0.7 0.3 - 1.2 mg/dL   GFR calc non Af Amer >60 >60 mL/min   GFR calc Af Amer >60 >60 mL/min   Anion gap 9 5 - 15  Urinalysis, Complete w Microscopic  Result Value Ref Range   Color, Urine YELLOW YELLOW   APPearance CLEAR CLEAR   Specific Gravity, Urine 1.018 1.005 - 1.030   pH 6.0 5.0 - 8.0   Glucose, UA NEGATIVE NEGATIVE mg/dL   Hgb urine dipstick NEGATIVE NEGATIVE   Bilirubin Urine NEGATIVE NEGATIVE   Ketones, ur NEGATIVE NEGATIVE mg/dL   Protein, ur NEGATIVE NEGATIVE mg/dL   Nitrite NEGATIVE NEGATIVE   Leukocytes, UA NEGATIVE NEGATIVE   RBC / HPF 0-5 0 - 5 RBC/hpf   WBC, UA 0-5 0 - 5 WBC/hpf   Bacteria, UA  RARE (A) NONE SEEN   Squamous Epithelial / LPF 0-5 0 - 5   Mucus PRESENT   Ammonia  Result Value Ref Range   Ammonia 36 (H) 9 - 35 umol/L  Urine rapid drug screen (hosp performed)  Result Value Ref Range   Opiates NONE DETECTED NONE DETECTED   Cocaine NONE DETECTED NONE DETECTED   Benzodiazepines NONE DETECTED NONE DETECTED   Amphetamines NONE DETECTED NONE DETECTED   Tetrahydrocannabinol NONE DETECTED NONE DETECTED   Barbiturates NONE DETECTED NONE DETECTED  Ethanol  Result Value Ref Range   Alcohol, Ethyl (B) <10 <10 mg/dL  Lithium level  Result Value Ref Range   Lithium Lvl <0.06 (L) 0.60 - 1.20 mmol/L  I-stat troponin, ED  Result Value Ref Range   Troponin i, poc 0.00 0.00 - 0.08 ng/mL   Comment 3          CBG monitoring, ED  Result Value Ref Range   Glucose-Capillary 86 70 - 99 mg/dL  I-Stat Chem 8, ED  Result Value Ref Range   Sodium 141 135 - 145 mmol/L   Potassium 4.1 3.5 - 5.1 mmol/L   Chloride 105 98 - 111 mmol/L   BUN 22 (H) 6 - 20 mg/dL   Creatinine, Ser 4.09 0.61 - 1.24 mg/dL   Glucose, Bld 89 70 - 99 mg/dL   Calcium, Ion 8.11 1.15 - 1.40 mmol/L   TCO2 26 22 - 32 mmol/L   Hemoglobin 14.6  13.0 - 17.0 g/dL   HCT 91.4 78.2 - 95.6 %   Ct Head Wo Contrast  Result Date: 01/25/2018 CLINICAL DATA:  Headache.  History of multiple sclerosis. EXAM: CT HEAD WITHOUT CONTRAST TECHNIQUE: Contiguous axial images were obtained from the base of the skull through the vertex without intravenous contrast. COMPARISON:  10/29/2017 FINDINGS: Brain: Low-density areas throughout the deep white matter are stable. Mild cerebral volume loss. No acute intracranial abnormality. Specifically, no hemorrhage, hydrocephalus, mass lesion, acute infarction, or significant intracranial injury. Vascular: No hyperdense vessel or unexpected calcification. Skull: No acute calvarial abnormality. Sinuses/Orbits: Visualized paranasal sinuses and mastoids clear. Orbital soft tissues unremarkable. Other: None IMPRESSION: Stable low-density areas throughout the deep white matter compatible with demyelinating process/multiple sclerosis. No acute intracranial abnormality. Electronically Signed   By: Charlett Nose M.D.   On: 01/25/2018 19:10   Mr Laqueta Jean And Wo Contrast  Result Date: 01/26/2018 CLINICAL DATA:  Initial evaluation for acute altered mental status, headache. History of multiple sclerosis. EXAM: MRI HEAD WITHOUT AND WITH CONTRAST TECHNIQUE: Multiplanar, multiecho pulse sequences of the brain and surrounding structures were obtained without and with intravenous contrast. CONTRAST:  20mL MULTIHANCE GADOBENATE DIMEGLUMINE 529 MG/ML IV SOLN COMPARISON:  Prior CT from earlier the same day as well as previous MRI from 10/28/2017. FINDINGS: Brain: Moderately advanced cerebral atrophy for age. Extensive T2/FLAIR hyperintensity involving the periventricular, deep, and juxta cortical white matter both cerebral hemispheres, consistent with history of advanced demyelinating disease. Associated T1 hypointensity, consistent with chronic demyelination. Overall, appearance is relatively similar as compared to most recent MRI from 10/28/2017  without evidence for significant disease progression. No abnormal restricted diffusion or enhancement to suggest active demyelination. No evidence for acute infarct. Gray-white matter differentiation maintained. No areas of remote cortical infarction. No foci of susceptibility artifact to suggest acute or chronic intracranial hemorrhage. No mass lesion, midline shift or mass effect. No hydrocephalus. No extra-axial fluid collection. No abnormal enhancement. Pituitary gland normal. Vascular: Major intracranial vascular flow voids maintained. Skull and upper  cervical spine: Craniocervical junction normal. Upper cervical spine within normal limits. Bone marrow signal intensity normal. No scalp soft tissue abnormality. Sinuses/Orbits: Globes and orbital soft tissues demonstrate no acute finding. Mild mucosal thickening within the left maxillary sinus. Paranasal sinuses are otherwise clear. No mastoid effusion. Inner ear structures normal. Other: None. IMPRESSION: 1. Changes related to severe chronic demyelinating disease, overall similar relative to most recent MRI from 10/28/2017. No evidence for active demyelination on today's exam. 2. Moderately advanced parenchymal volume loss. Electronically Signed   By: Rise Mu M.D.   On: 01/26/2018 00:51    MRI today without any significant changes since April 2019.  Patient states he is feeling better, he is requesting to eat. May have a mild exacerbation of MS, will start prednisone therapy.   I have called and spoken with his mother, [name], who is his legal guardian- she is aware of MRI results, initiation of prednisone therapy, and need for close follow-up with OP neurology.  Patient is well established with Dr. Anne Hahn at [facility] neurology.  [name] herself is wheelchair-bound, states usually they put him into a taxi and she will pay for this when he arrives at home.  She is comfortable with [place] discharging him home in this manner.  2:19 AM Taxi  company not comfortable taking patient home, apparently there was an issue with payment last time this was arranged.  He was given taxi voucher from ED to ensure safe ride home.   Garlon Hatchet, PA-C 01/26/18 0220    Wynetta Fines, MD 01/27/18 [phone]

## 2018-01-25 NOTE — ED Notes (Signed)
Brandon [name] pt mother would like an update [phone]

## 2018-01-25 NOTE — ED Triage Notes (Addendum)
Per EMS, pt reports headache that started two weeks ago and got worse today. Pt stated this morning he felt like he was going to faint. EMS VSS. Hx of stroke, left sided weakness baseline. Hx of HTN and pt is compliant with medications but unsure if he took it this morning. Pt A&Ox4, but seems to be confused/slow to respond with dysarthria. Pt reports he takes Eliquis.

## 2018-01-25 NOTE — ED Provider Notes (Signed)
Patient placed in Quick Look pathway, seen and evaluated   Chief Complaint: headache  HPI:   Pt thinks he has had a migraine for 2 weeks.  Pt reports he is on eliquis after having a stroke.   ROS: weakness left arm.   Physical Exam:   Gen: No distress  Neuro: Awake and Alert  Skin: Warm    Focused Exam: down turn mouth,  Weakness left arm  (?chronic)    Initiation of care has begun. The patient has been counseled on the process, plan, and necessity for staying for the completion/evaluation, and the remainder of the medical screening examination   Osie Cheeks 01/25/18 1651    Derwood Kaplan, MD 01/27/18 [phone]

## 2018-01-25 NOTE — ED Notes (Signed)
MD to do ultrasound IV

## 2018-01-26 ENCOUNTER — Telehealth: Payer: Self-pay | Admitting: Neurology

## 2018-01-26 DIAGNOSIS — G4489 Other headache syndrome: Secondary | ICD-10-CM | POA: Diagnosis not present

## 2018-01-26 MED ORDER — PREDNISONE 20 MG PO TABS
60.0000 mg | ORAL_TABLET | Freq: Once | ORAL | Status: AC
  Administered 2018-01-26: 60 mg via ORAL
  Filled 2018-01-26: qty 3

## 2018-01-26 MED ORDER — GADOBENATE DIMEGLUMINE 529 MG/ML IV SOLN
20.0000 mL | Freq: Once | INTRAVENOUS | Status: AC | PRN
  Administered 2018-01-25: 20 mL via INTRAVENOUS

## 2018-01-26 MED ORDER — PREDNISONE 20 MG PO TABS: ORAL_TABLET | ORAL | 0 refills | Status: DC

## 2018-01-26 MED ORDER — IBUPROFEN 800 MG PO TABS
800.0000 mg | ORAL_TABLET | Freq: Once | ORAL | Status: AC
  Administered 2018-01-26: 800 mg via ORAL
  Filled 2018-01-26: qty 1

## 2018-01-26 NOTE — Telephone Encounter (Signed)
Spoke to Dr. Anne Hahn. No appt available the next two weeks. He stated mother requested appt next week because he could not come in this week. Dr. Anne Hahn ok to schedule him 02/19/18 at 12pm and call if any one cx before then.  I called and relayed this to mother. She was agreeable to date/time and knows I will call if any cx.

## 2018-01-26 NOTE — Discharge Instructions (Signed)
Take the prescribed medication as directed. Follow-up with Dr. Anne Hahn-- call his office in the morning. Return to the ED for new or worsening symptoms.

## 2018-01-26 NOTE — Telephone Encounter (Signed)
Dr. Willis- FYI so you are aware.  

## 2018-01-26 NOTE — ED Notes (Signed)
Patient verbalizes understanding of medications and discharge instructions. No further questions at this time. VSS and ambulatory at discharge.   ED provider spoke with patient's mom who requests cab for patient and will be waiting at home. Provider went over discharge instructions with mom (patient's legal guardian) as well.

## 2018-01-26 NOTE — Telephone Encounter (Signed)
Tresa Endo PT with [facility] called to inform Dr. Anne Hahn that she saw the pt yesterday.Pt had been experiencing blurred vision and feeling fatigue. Pt went to ED per request. For any questions Tresa Endo can be reached at [phone]

## 2018-01-26 NOTE — ED Notes (Signed)
plz call Mom @ [phone]

## 2018-01-26 NOTE — Telephone Encounter (Signed)
I called and spoke with mother. Relayed message per Dr. Marjory Lies. She verbalized understanding and will follow up with psychiatry.

## 2018-01-26 NOTE — Telephone Encounter (Signed)
I called the mother.  The patient began complaining of headache yesterday, he had spots in front of his eyes, he went to the emergency room and had a CT scan of the brain that was unremarkable.  This may have represented a migraine headache.  The patient is sleeping currently, she does not know whether he has a headache today.  He is getting physical therapy at home, he is walking some with a walker.  I will try to get a revisit set up for him.

## 2018-02-17 ENCOUNTER — Ambulatory Visit (HOSPITAL_COMMUNITY): Payer: Self-pay | Admitting: Psychiatry

## 2018-02-19 ENCOUNTER — Ambulatory Visit (INDEPENDENT_AMBULATORY_CARE_PROVIDER_SITE_OTHER): Payer: Medicare Other | Admitting: Neurology

## 2018-02-19 ENCOUNTER — Encounter: Payer: Self-pay | Admitting: Neurology

## 2018-02-19 VITALS — BP 137/91 | HR 71 | Ht 74.0 in | Wt 301.0 lb

## 2018-02-19 DIAGNOSIS — F203 Undifferentiated schizophrenia: Secondary | ICD-10-CM | POA: Diagnosis not present

## 2018-02-19 DIAGNOSIS — G35 Multiple sclerosis: Secondary | ICD-10-CM | POA: Diagnosis not present

## 2018-02-19 DIAGNOSIS — R269 Unspecified abnormalities of gait and mobility: Secondary | ICD-10-CM | POA: Diagnosis not present

## 2018-02-19 MED ORDER — DIVALPROEX SODIUM 500 MG PO DR TAB: 500.0000 mg | DELAYED_RELEASE_TABLET | Freq: Two times a day (BID) | ORAL | 3 refills | Status: DC

## 2018-02-19 NOTE — Progress Notes (Signed)
Reason for visit: Multiple sclerosis  Brandon [name] is an 27 y.o. male  History of present illness:  Brandon [name] is a 27 year old right-handed black male with a history of schizophrenia and multiple sclerosis.  The patient has recently been to the emergency room on 25 January 2018 with a headache, MRI of the brain was done at that time and showed no significant progression from a prior study done in April 2019.  The patient has a history of glaucoma, he has eyedrops for this.  He does have headaches that occur with some frequency, it is difficult to get a good history from this patient.  The patient went to the emergency room in July because of headache.  The patient has had good improvement in his strength and sensation, he is able to ambulate without falling, he has complained of some visual blurring.  He will be seeing his eye doctor in the near future.  He is out of physical therapy.  He is having some troubles with chronic insomnia, he takes melatonin for this.  He reports some constipation, he takes stool softeners, he sometimes have difficulty voiding the bladder.  He is on Ocrevus, the next infusion is due in October.  The patient returns for an evaluation.   Past Medical History:  Diagnosis Date  . ADHD (attention deficit hyperactivity disorder)   . Bipolar 1 disorder (HCC)   . Chronic back pain   . Chronic neck pain   . Hypertension   . Multiple sclerosis (HCC) 05/20/2013   left sided weakness, dysarthria  . Non-compliance   . Obesity   . Schizophrenia (HCC)   . Stroke [facility])    left sided deficits    Past Surgical History:  Procedure Laterality Date  . None      Family History  Problem Relation Age of Onset  . Diabetes Mother   . ADD / ADHD Brother     Social history:  reports that he has been smoking cigarettes. He has been smoking about 0.25 packs per day. He has never used smokeless tobacco. He reports that he drinks alcohol. He reports that he has current or past  drug history. Drug: Marijuana.   No Known Allergies  Medications:  Prior to Admission medications   Medication Sig Start Date End Date Taking? Authorizing Provider  acetaminophen (TYLENOL) 325 MG tablet Take 2 tablets (650 mg total) by mouth every 6 (six) hours as needed for mild pain or moderate pain (or Fever >/= 101). 11/02/17  Yes Leroy Sea, MD  amLODipine (NORVASC) 10 MG tablet Take 10 mg by mouth daily.   Yes [provider]  ELIQUIS STARTER PACK (ELIQUIS STARTER PACK) 5 MG TABS Take 5 mg by mouth 2 (two) times daily. 11/02/17  Yes Leroy Sea, MD  Melatonin 5 MG TABS Take 5-10 mg by mouth at bedtime as needed (sleep).   Yes [provider]  ocrelizumab in sodium chloride 0.9 % 500 mL 300mg  IV on day one, repeat 300mg  IV on day 15. Then 600mg  IV every 6 months after that 09/02/17  Yes York Spaniel, MD  Oxcarbazepine (TRILEPTAL) 300 MG tablet Take 1 tablet (300 mg total) by mouth 2 (two) times daily. 11/08/17  Yes Lord, Herminio Heads, NP  polyethylene glycol (MIRALAX / GLYCOLAX) packet Take 17 g by mouth 2 (two) times daily. 11/02/17  Yes Leroy Sea, MD  propranolol (INDERAL) 10 MG tablet Take 1 tablet (10 mg total) by mouth 2 (  two) times daily. 05/03/15  Yes Lord, Herminio Heads, NP  TRAVATAN Z 0.004 % SOLN ophthalmic solution Place 1 drop into both eyes at bedtime. 10/19/17  Yes [provider]  traZODone (DESYREL) 100 MG tablet Take 1 tablet (100 mg total) by mouth at bedtime as needed (agitation). Patient taking differently: Take 150-300 mg by mouth at bedtime as needed for sleep.  11/08/17  Yes Charm Rings, NP    ROS:  Out of a complete 14 system review of symptoms, the patient complains only of the following symptoms, and all other reviewed systems are negative.  Visual blurring Headache  Blood pressure (!) 137/91, pulse 71, height 6\' 2"  (1.88 m), weight (!) 301 lb (136.5 kg).  Physical Exam  General: The patient is alert and  cooperative at the time of the examination.  The patient is markedly obese.  Skin: No significant peripheral edema is noted.   Neurologic Exam  Mental status: The patient is alert and cooperative at the time of examination.   Cranial nerves: Facial symmetry is present. Speech is somewhat slurred. Extraocular movements are full. Visual fields are full.  Pupils are equal, round, and reactive to light.  Discs are flat bilaterally.  Motor: The patient has good strength in all 4 extremities.  Sensory examination: Soft touch sensation is symmetric on the face, arms, and legs.  Coordination: The patient has good finger-nose-finger and heel-to-shin bilaterally.  Gait and station: The patient has a slightly wide-based gait, the patient can walk independently. Romberg is negative. No drift is seen.  Reflexes: Deep tendon reflexes are symmetric.   MRI brain 01/26/18:  IMPRESSION: 1. Changes related to severe chronic demyelinating disease, overall similar relative to most recent MRI from 10/28/2017. No evidence for active demyelination on today's exam. 2. Moderately advanced parenchymal volume loss.  * MRI scan images were reviewed online. I agree with the written report.    Assessment/Plan:  1.  Multiple sclerosis  2.  Schizophrenia  3.  Headache  The patient is having some headache events several times a week, his mother has migraine headaches as well.  The patient will be taken off of Trileptal and placed on Depakote as a mood stabilizer and for treatment of the migraine headache.  The patient is functioning fairly well physically.  He will continue on the Ocrevus.  He will follow-up in 6 months.  The patient has recently had blood work done in the emergency room in July.  Marlan Palau MD 02/19/2018 11:24 AM  [facility] [address] [place], [place] [address]  Phone [phone] Fax [phone]

## 2018-02-19 NOTE — Patient Instructions (Signed)
Stop the Trileptal and start Depakote 500 mg twice a day.  Depakote (valproic acid) is a seizure medication that also has an FDA approval for migraine headache. The most common potential side effects of this medication include weight gain, tremor, or possible stomach upset. This medication can potentially cause liver problems. If confusion is noted on this medication, contact our office immediately.

## 2018-04-09 ENCOUNTER — Ambulatory Visit (HOSPITAL_COMMUNITY): Payer: Self-pay

## 2018-04-12 MED FILL — OCREVUS 300 MG/10ML SOLN: 300 | 15 days supply | Qty: 20 | Fill #1

## 2018-04-16 ENCOUNTER — Other Ambulatory Visit: Payer: Self-pay | Admitting: Neurology

## 2018-04-16 ENCOUNTER — Telehealth: Payer: Self-pay | Admitting: Neurology

## 2018-04-16 DIAGNOSIS — G35 Multiple sclerosis: Secondary | ICD-10-CM

## 2018-04-16 NOTE — Telephone Encounter (Signed)
I called the mother.  Mother is concerned the patient spent only about 5 hours for the Ocrevus infusion, 8 hours as recommended.  It is possible that the infusion suite is not observing the full amount of time following injection, I do not believe that the Ocrevus is being injected too quickly.

## 2018-04-16 NOTE — Telephone Encounter (Signed)
Pts mother([name]) requesting a call stating that the [facility] infusion RNs rush the medication and appt, and the pt is never there for the full 8 hours. Stating the last time the pt went he had foam coming out of his mouth. Requesting a call to discuss.

## 2018-04-19 ENCOUNTER — Other Ambulatory Visit (HOSPITAL_COMMUNITY): Payer: Self-pay | Admitting: General Practice

## 2018-04-23 ENCOUNTER — Other Ambulatory Visit: Payer: Self-pay | Admitting: Neurology

## 2018-04-23 ENCOUNTER — Ambulatory Visit (HOSPITAL_COMMUNITY)
Admission: RE | Admit: 2018-04-23 | Discharge: 2018-04-23 | Disposition: A | Discharge status: Home / Self Care | Payer: Medicare Other | Source: Ambulatory Visit | Attending: Neurology | Admitting: Neurology

## 2018-04-23 ENCOUNTER — Encounter (HOSPITAL_COMMUNITY): Payer: Self-pay

## 2018-04-23 DIAGNOSIS — G35 Multiple sclerosis: Secondary | ICD-10-CM | POA: Insufficient documentation

## 2018-04-23 MED ORDER — SODIUM CHLORIDE 0.9 % IV SOLN
Freq: Once | INTRAVENOUS | Status: AC
  Administered 2018-04-23: 08:00:00 via INTRAVENOUS

## 2018-04-23 MED ORDER — SODIUM CHLORIDE 0.9 % IV SOLN
12.5000 mg | Freq: Once | INTRAVENOUS | Status: DC
  Filled 2018-04-23: qty 0.25

## 2018-04-23 MED ORDER — OCRELIZUMAB 300 MG/10ML IV SOLN
300.0000 mg | Freq: Once | INTRAVENOUS | Status: DC
  Filled 2018-04-23: qty 10

## 2018-04-23 MED ORDER — METHYLPREDNISOLONE SODIUM SUCC 125 MG IJ SOLR: 100.0000 mg | Freq: Once | INTRAMUSCULAR | Status: DC

## 2018-04-23 MED ORDER — SODIUM CHLORIDE 0.9 % IV SOLN
600.0000 mg | Freq: Once | INTRAVENOUS | Status: AC
  Administered 2018-04-23: 600 mg via INTRAVENOUS
  Filled 2018-04-23: qty 20

## 2018-04-23 MED ORDER — DIPHENHYDRAMINE HCL 25 MG PO CAPS
50.0000 mg | ORAL_CAPSULE | Freq: Once | ORAL | Status: AC
  Administered 2018-04-23: 50 mg via ORAL
  Filled 2018-04-23: qty 2

## 2018-04-23 MED ORDER — METHYLPREDNISOLONE SODIUM SUCC 125 MG IJ SOLR
125.0000 mg | Freq: Once | INTRAMUSCULAR | Status: AC
  Administered 2018-04-23: 125 mg via INTRAVENOUS
  Filled 2018-04-23: qty 2

## 2018-04-23 NOTE — Progress Notes (Signed)
Pt Ocrevus infusion tolerated well.  Infusion took 4 hours, and was infused per the recommended protocol increasing the rate every 30 minutes starting at 40 ml/hr and ending at 200 ml/hr until its completion.  Pt was advised to stay the recommended one hour observation post infusion but pt's mother states the [facility] Zena[facility] Niece is scheduled to pick them up between 1430-1500 and she doesn't want to miss it.  Pt was d/c from department to lobby at 1425 via wheelchair.  Pt stayed 45 minutes post infusion.  Pt's mother was given next appointment for April 2020 for pt.

## 2018-05-17 ENCOUNTER — Ambulatory Visit: Payer: Medicare Other | Admitting: Neurology

## 2018-05-21 ENCOUNTER — Ambulatory Visit (HOSPITAL_COMMUNITY)
Admission: RE | Admit: 2018-05-21 | Discharge: 2018-05-21 | Disposition: A | Discharge status: Home / Self Care | Payer: Medicare Other | Attending: Psychiatry | Admitting: Psychiatry

## 2018-05-21 ENCOUNTER — Ambulatory Visit (INDEPENDENT_AMBULATORY_CARE_PROVIDER_SITE_OTHER): Payer: Medicare Other | Admitting: Psychology

## 2018-05-21 DIAGNOSIS — F79 Unspecified intellectual disabilities: Secondary | ICD-10-CM

## 2018-05-21 DIAGNOSIS — F209 Schizophrenia, unspecified: Secondary | ICD-10-CM

## 2018-05-21 DIAGNOSIS — F329 Major depressive disorder, single episode, unspecified: Secondary | ICD-10-CM | POA: Insufficient documentation

## 2018-05-21 DIAGNOSIS — Z1330 Encounter for screening examination for mental health and behavioral disorders, unspecified: Secondary | ICD-10-CM | POA: Diagnosis present

## 2018-05-21 NOTE — H&P (Signed)
Behavioral Health Medical Screening Exam  Brandon [name] is an 27 y.o. male.  Total Time spent with patient: 20 minutes  Psychiatric Specialty Exam: Physical Exam  Nursing note and vitals reviewed. Constitutional: He appears well-developed and well-nourished.  Cardiovascular: Normal rate.  Respiratory: Effort normal.  Musculoskeletal: Normal range of motion.  Neurological: He is alert.  Skin: Skin is warm.    Review of Systems  Constitutional: Negative.   HENT: Negative.   Eyes: Negative.   Respiratory: Negative.   Cardiovascular: Negative.   Gastrointestinal: Negative.   Genitourinary: Negative.   Musculoskeletal: Negative.   Skin: Negative.   Neurological: Negative.   Endo/Heme/Allergies: Negative.   Psychiatric/Behavioral: Positive for depression and hallucinations. Negative for substance abuse and suicidal ideas. The patient is not nervous/anxious.     Blood pressure 119/71, pulse 79, temperature 98.1 F (36.7 C), resp. rate 16, SpO2 100 %.There is no height or weight on file to calculate BMI.  General Appearance: Casual and Disheveled  Eye Contact:  Minimal  Speech:  Slow  Volume:  Decreased  Mood:  Depressed and Irritable  Affect:  Flat  Thought Process:  Linear and Descriptions of Associations: Intact  Orientation:  Full (Time, Place, and Person)  Thought Content:  Hallucinations: Auditory  Suicidal Thoughts:  No  Homicidal Thoughts:  No  Memory:  Immediate;   Fair Recent;   Fair Remote;   Fair  Judgement:  Impaired  Insight:  Lacking  Psychomotor Activity:  Decreased  Concentration: Concentration: Fair and Attention Span: Poor  Recall:  Poor  Fund of Knowledge:Poor  Language: Poor  Akathisia:  No  Handed:  Right  AIMS (if indicated):     Assets:  Financial Resources/Insurance Housing Social Support Transportation  Sleep:       Musculoskeletal: Strength & Muscle Tone: within normal limits Gait & Station: unsteady Patient leans: N/A  Blood  pressure 119/71, pulse 79, temperature 98.1 F (36.7 C), resp. rate 16, SpO2 100 %.  Recommendations:  Based on my evaluation the patient does not appear to have an emergency medical condition.  Gerlene Burdock Lilianna Case, FNP 05/21/2018, 3:12 PM

## 2018-05-21 NOTE — BH Assessment (Signed)
Assessment Note  Brandon [name] is a single 27 y.o. male who presents to [facility] for a walk-in assessment accompanied by his mother. Pt's mother, Pesach Frisch, is pt's legal guardian. Pt & mother were at [facility] for psych testing for over 5 hours today. When waiting for ride pt became agitated- yelling and hitting his head.  staff encouraged mother to walk pt to [facility] for assessment. Pt presents sleepy and sluggish. He denies SI and HI. Pt reports auditory hallucinations of voices. Pt reports voices are usually nice, but can get mad at him when he doesn't do what they say. Mother reports pt has ACCT team assistance, [facility] & pt has had recent medication changes. Pt has hx of schizoaffective disorder. Pt has limited insight and judgment. Pt has current legal charges for trespassing and panhandling. ? Pt's OP history includes [facility]. IP history includes [facility] . Last admission was at [facility] 12/2017. ? MSE: Pt is casually dressed but disheveled due to clothing falling off of him. Pt is drowsy (mother reports due to getting up at 5 am), oriented x4 with slurred speech and psychomotor retardation. Eye contact is fair. Pt's mood is pleasant but preoccupied and affect is blunted. Affect is congruent with mood. Thought process is incoherent at times, relevant at others. Pt occasionally responds to internal stimuli  By laughing or mumbling. Pt was cooperative throughout assessment.   Disposition: Reola Calkins, NP recommends pt follow up with his current ACCT team. Mother advised to call crisis line if necessary. Diagnosis: F25.1 Depressed type  Past Medical History:  Past Medical History:  Diagnosis Date  . ADHD (attention deficit hyperactivity disorder)   . Bipolar 1 disorder (HCC)   . Chronic back pain   . Chronic neck pain   . Hypertension   . Multiple sclerosis (HCC) 05/20/2013   left sided weakness, dysarthria  . Non-compliance   . Obesity   . Schizophrenia (HCC)   . Stroke [facility])    left sided  deficits    Past Surgical History:  Procedure Laterality Date  . None      Family History:  Family History  Problem Relation Age of Onset  . Diabetes Mother   . ADD / ADHD Brother     Social History:  reports that he has been smoking cigarettes. He has been smoking about 0.25 packs per day. He has never used smokeless tobacco. He reports that he drinks alcohol. He reports that he has current or past drug history. Drug: Marijuana.  Additional Social History:  Alcohol / Drug Use Pain Medications: See MAR Prescriptions: See MAR Over the Counter: See MAR History of alcohol / drug use?: Yes Longest period of sobriety (when/how long): unknown  CIWA: CIWA-Ar BP: 119/71 Pulse Rate: 79 COWS:    Allergies: No Known Allergies  Home Medications:  (Not in a hospital admission)  OB/GYN Status:  No LMP for male patient.  General Assessment Data [facility] of Assessment[facility] TTS Assessment: In system Is this a Tele or Face-to-Face Assessment?: Face-to-Face Is this an Initial Assessment or a Re-assessment for this encounter?: Initial Assessment Patient Accompanied by:: Parent Language Other than English: No Living Arrangements: Other (Comment)(home) What gender do you identify as?: Male Marital status: Single Living Arrangements: Parent Can pt return to current living arrangement?: Yes Admission Status: Voluntary Is patient capable of signing voluntary admission?: No Referral Source: MD Insurance type: medicaid     Crisis Care Plan Living Arrangements: Parent Legal Guardian: Mother Name of Psychiatrist:  [facility] Name of Therapist: [facility]- [facility]  Education Status Is patient currently in school?: No Is the patient employed, unemployed or receiving disability?: Receiving disability income  Risk to self with the past 6 months Suicidal Ideation: No Has patient been a risk to self within the past 6 months prior to admission? : No Suicidal Intent: No Has  patient had any suicidal intent within the past 6 months prior to admission? : No Is patient at risk for suicide?: No Suicidal Plan?: No Has patient had any suicidal plan within the past 6 months prior to admission? : No What has been your use of drugs/alcohol within the last 12 months?: etoh 2x weekly Previous Attempts/Gestures: No Other Self Harm Risks: hits head with fist to stop voices Triggers for Past Attempts: Other (Comment)(voices) Intentional Self Injurious Behavior: None Family Suicide History: No Persecutory voices/beliefs?: Yes Depression: No Depression Symptoms: Feeling angry/irritable Substance abuse history and/or treatment for substance abuse?: Yes Suicide prevention information given to non-admitted patients: Not applicable  Risk to Others within the past 6 months Homicidal Ideation: No Does patient have any lifetime risk of violence toward others beyond the six months prior to admission? : No Thoughts of Harm to Others: No Current Homicidal Intent: No Current Homicidal Plan: No Access to Homicidal Means: No History of harm to others?: No Assessment of Violence: None Noted Does patient have access to weapons?: No Criminal Charges Pending?: Yes Describe Pending Criminal Charges: tresspassing Does patient have a court date: Yes Court Date: 06/08/18 Is patient on probation?: Unknown  Psychosis Hallucinations: Auditory Delusions: Persecutory  Mental Status Report Appearance/Hygiene: Bizarre, Disheveled Eye Contact: Poor Motor Activity: Psychomotor retardation Speech: Incoherent, Logical/coherent, Soft, Slurred Level of Consciousness: Drowsy Mood: Preoccupied, Pleasant Affect: Blunted Anxiety Level: Minimal Thought Processes: Thought Blocking, Irrelevant, Relevant Judgement: Partial Orientation: Person, Place, Situation Obsessive Compulsive Thoughts/Behaviors: None  Cognitive Functioning Concentration: Decreased Memory: Unable to Assess Is patient  IDD: (UTA) Insight: Poor Impulse Control: Poor Appetite: Good Have you had any weight changes? : No Change Sleep: No Change Total Hours of Sleep: 5 Vegetative Symptoms: None  ADLScreening [facility] Assessment Services) Patient's cognitive ability adequate to safely complete daily activities?: Yes Patient able to express need for assistance with ADLs?: Yes Independently performs ADLs?: No  Prior Inpatient Therapy Prior Inpatient Therapy: Yes Prior Therapy Dates: multiple Prior Therapy Facilty/Provider(s): [facility] Reason for Treatment: voices  Prior Outpatient Therapy Prior Outpatient Therapy: Yes Prior Therapy Dates: ongoing Prior Therapy Facilty/Provider(s): [facility] Reason for Treatment: schizoaffective do Does patient have an ACCT team?: Yes Does patient have Intensive In-House Services?  : No Does patient have [facility] services? : No Does patient have P4CC services?: No  ADL Screening (condition at time of admission) Patient's cognitive ability adequate to safely complete daily activities?: Yes Is the patient deaf or have difficulty hearing?: No Does the patient have difficulty seeing, even when wearing glasses/contacts?: No Does the patient have difficulty concentrating, remembering, or making decisions?: Yes Patient able to express need for assistance with ADLs?: Yes Does the patient have difficulty dressing or bathing?: No Independently performs ADLs?: No  Home Assistive Devices/Equipment Home Assistive Devices/Equipment: None  Therapy Consults (therapy consults require a physician order) PT Evaluation Needed: No OT Evalulation Needed: No SLP Evaluation Needed: No Abuse/Neglect Assessment (Assessment to be complete while patient is alone) Abuse/Neglect Assessment Can Be Completed: Yes Physical Abuse: Denies Verbal Abuse: Denies Sexual Abuse: Denies Exploitation of patient/patient's resources: Denies Self-Neglect: Denies Values / Beliefs Cultural Requests During  Hospitalization:  None Spiritual Requests During Hospitalization: None Consults Spiritual Care Consult Needed: No Social Work Consult Needed: No Merchant navy officer (For Healthcare) Does Patient Have a Medical Advance Directive?: No Would patient like information on creating a medical advance directive?: No - Patient declined          Disposition: Reola Calkins, NP recommends pt follow up with his current ACCT team.    On Site Evaluation by:   Reviewed with Physician:    Clearnce Sorrel 05/21/2018 3:31 PM

## 2018-06-08 ENCOUNTER — Other Ambulatory Visit: Payer: Self-pay | Admitting: Neurology

## 2018-06-18 ENCOUNTER — Telehealth: Payer: Self-pay | Admitting: Neurology

## 2018-06-18 NOTE — Telephone Encounter (Signed)
I got a call today from Forbes Cellar, the patient apparently was referred for evaluation for ADD.  It is not clear who made the referral, I do not believe this referral came through this office.  Apparently the mother may have made the referral trying to get more services for the patient.

## 2018-06-25 ENCOUNTER — Ambulatory Visit (INDEPENDENT_AMBULATORY_CARE_PROVIDER_SITE_OTHER): Payer: Medicare Other | Admitting: Psychology

## 2018-06-25 DIAGNOSIS — F79 Unspecified intellectual disabilities: Secondary | ICD-10-CM

## 2018-06-25 DIAGNOSIS — F209 Schizophrenia, unspecified: Secondary | ICD-10-CM

## 2018-07-20 ENCOUNTER — Ambulatory Visit (INDEPENDENT_AMBULATORY_CARE_PROVIDER_SITE_OTHER): Payer: Medicare Other | Admitting: Psychology

## 2018-07-20 DIAGNOSIS — F72 Severe intellectual disabilities: Secondary | ICD-10-CM | POA: Diagnosis not present

## 2018-07-20 DIAGNOSIS — F209 Schizophrenia, unspecified: Secondary | ICD-10-CM | POA: Diagnosis not present

## 2018-08-20 ENCOUNTER — Other Ambulatory Visit: Payer: Self-pay | Admitting: Neurology

## 2018-09-02 ENCOUNTER — Ambulatory Visit (INDEPENDENT_AMBULATORY_CARE_PROVIDER_SITE_OTHER): Payer: Medicare Other | Admitting: Neurology

## 2018-09-02 ENCOUNTER — Encounter: Payer: Self-pay | Admitting: Neurology

## 2018-09-02 VITALS — BP 112/82 | HR 76 | Ht 74.0 in | Wt 295.0 lb

## 2018-09-02 DIAGNOSIS — G35 Multiple sclerosis: Secondary | ICD-10-CM | POA: Diagnosis not present

## 2018-09-02 DIAGNOSIS — Z5181 Encounter for therapeutic drug level monitoring: Secondary | ICD-10-CM

## 2018-09-02 DIAGNOSIS — G35D Multiple sclerosis, unspecified: Secondary | ICD-10-CM

## 2018-09-02 DIAGNOSIS — R269 Unspecified abnormalities of gait and mobility: Secondary | ICD-10-CM | POA: Diagnosis not present

## 2018-09-02 MED ORDER — TOPIRAMATE 25 MG PO TABS: ORAL_TABLET | ORAL | 3 refills | Status: DC

## 2018-09-02 NOTE — Progress Notes (Signed)
Reason for visit: Multiple sclerosis, schizophrenia  Brandon [name] is an 28 y.o. male  History of present illness:  Brandon [name] is a 28 year old right-handed white male with a history of relapsing remitting multiple sclerosis and a history of schizophrenia.  The patient comes in today with his mother.  The patient is on Ocrevus, he is tolerating the medication well.  He has not had any worsening problems with speech, vision, numbness or weakness of the extremities, or any change in balance.  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  The patient is not doing well with his schizophrenia according to the mother.  The patient continues to have frequent headache, the mother also has a history of migraine.  He was placed on Depakote but this resulted in sedation and drooling.  The patient has stopped the medication.  He remains on Eliquis secondary to a pulmonary embolism that occurred approximately a year ago.  The patient comes to this office for an evaluation.  Past Medical History:  Diagnosis Date  . ADHD (attention deficit hyperactivity disorder)   . Bipolar 1 disorder (HCC)   . Chronic back pain   . Chronic neck pain   . Hypertension   . Multiple sclerosis (HCC) 05/20/2013   left sided weakness, dysarthria  . Non-compliance   . Obesity   . Schizophrenia (HCC)   . Stroke [facility])    left sided deficits    Past Surgical History:  Procedure Laterality Date  . None      Family History  Problem Relation Age of Onset  . Diabetes Mother   . ADD / ADHD Brother     Social history:  reports that he has been smoking cigarettes. He has been smoking about 0.25 packs per day. He has never used smokeless tobacco. He reports current alcohol use. He reports current drug use. Drug: Marijuana.   No Known Allergies  Medications:  Prior to Admission medications   Medication Sig Start Date End Date Taking? Authorizing Provider    acetaminophen (TYLENOL) 325 MG tablet Take 2 tablets (650 mg total) by mouth every 6 (six) hours as needed for mild pain or moderate pain (or Fever >/= 101). 11/02/17  Yes Leroy Sea, MD  amLODipine (NORVASC) 10 MG tablet Take 10 mg by mouth daily.   Yes [provider]  cariprazine (VRAYLAR) capsule Take 3 mg by mouth at bedtime.   Yes [provider]  ELIQUIS STARTER PACK (ELIQUIS STARTER PACK) 5 MG TABS Take 5 mg by mouth 2 (two) times daily. 11/02/17  Yes Leroy Sea, MD  Melatonin 5 MG TABS Take 5-10 mg by mouth at bedtime as needed (sleep).   Yes [provider]  ocrelizumab in sodium chloride 0.9 % 500 mL 300mg  IV on day one, repeat 300mg  IV on day 15. Then 600mg  IV every 6 months after that 09/02/17  Yes York Spaniel, MD  polyethylene glycol [facility] / GLYCOLAX) packet Take 17 g by mouth 2 (two) times daily. 11/02/17  Yes Leroy Sea, MD  propranolol (INDERAL) 10 MG tablet Take 1 tablet (10 mg total) by mouth 2 (two) times daily. 05/03/15  Yes Lord, Herminio Heads, NP  TRAVATAN Z 0.004 % SOLN ophthalmic solution Place 1 drop into both eyes at bedtime. 10/19/17  Yes [provider]  traZODone (DESYREL) 100 MG tablet Take 1 tablet (100 mg total) by mouth at bedtime as  needed (agitation). Patient taking differently: Take 150-300 mg by mouth at bedtime as needed for sleep.  11/08/17  Yes Charm Rings, NP  divalproex (DEPAKOTE) 500 MG DR tablet TAKE ONE TABLET BY MOUTH TWICE A DAY Patient not taking: Reported on 09/02/2018 06/08/18   York Spaniel, MD    ROS:  Out of a complete 14 system review of symptoms, the patient complains only of the following symptoms, and all other reviewed systems are negative.  Decreased activity Double vision Cough, wheezing Constipation Frequent waking, snoring, sleep talking Back pain Dizziness, headache Agitation, behavior problem, confusion, hallucinations  Blood pressure 112/82, pulse 76, height 6'  2" (1.88 m), weight 295 lb (133.8 kg).  Physical Exam  General: The patient is alert and cooperative at the time of the examination.  The patient is markedly obese.  The patient has a very flat affect.  Skin: No significant peripheral edema is noted.   Neurologic Exam  Mental status: The patient is alert and oriented x 3 at the time of the examination. The patient has apparent normal recent and remote memory, with an apparently normal attention span and concentration ability.   Cranial nerves: Facial symmetry is present. Speech is normal, no aphasia or dysarthria is noted. Extraocular movements are full. Visual fields are full.  Pupils are equal, round, and reactive to light.  Discs are flat bilaterally.  Motor: The patient has good strength in all 4 extremities.  Sensory examination: Soft touch sensation is symmetric on the face, arms, and legs.  Coordination: The patient has good finger-nose-finger and heel-to-shin bilaterally.  Gait and station: The patient has a slightly wide-based gait, decreased arm swing is seen bilaterally.  The patient can walk independently.  Reflexes: Deep tendon reflexes are symmetric.   Assessment/Plan:  1.  Multiple sclerosis  2.  Gait disturbance  3.  Migraine headache  4.  Schizophrenia  The patient will be placed on low-dose Topamax, working up to 50 mg at night.  He did not tolerate the Depakote previously.  The patient will continue the Ocrevus, we will check blood work today.  He will follow-up in 6 months.  We will consider MRI of the brain at that time.  Marlan Palau MD 09/02/2018 10:07 AM  [facility] [address] [place], [place] [address]  Phone [phone] Fax [phone]

## 2018-09-03 LAB — CBC WITH DIFFERENTIAL/PLATELET
Basophils Absolute: 0 10*3/uL (ref 0.0–0.2)
Basos: 1 %
EOS (ABSOLUTE): 0.2 10*3/uL (ref 0.0–0.4)
Eos: 3 %
Hematocrit: 39.9 % (ref 37.5–51.0)
Hemoglobin: 13.3 g/dL (ref 13.0–17.7)
Immature Grans (Abs): 0 10*3/uL (ref 0.0–0.1)
Immature Granulocytes: 0 %
Lymphocytes Absolute: 1.3 10*3/uL (ref 0.7–3.1)
Lymphs: 27 %
MCH: 26.9 pg (ref 26.6–33.0)
MCHC: 33.3 g/dL (ref 31.5–35.7)
MCV: 81 fL (ref 79–97)
Monocytes Absolute: 0.4 10*3/uL (ref 0.1–0.9)
Monocytes: 8 %
Neutrophils Absolute: 2.8 10*3/uL (ref 1.4–7.0)
Neutrophils: 61 %
Platelets: 164 10*3/uL (ref 150–450)
RBC: 4.94 x10E6/uL (ref 4.14–5.80)
RDW: 13.4 % (ref 11.6–15.4)
WBC: 4.6 10*3/uL (ref 3.4–10.8)

## 2018-09-03 LAB — COMPREHENSIVE METABOLIC PANEL
ALT: 30 IU/L (ref 0–44)
AST: 12 IU/L (ref 0–40)
Albumin/Globulin Ratio: 2.1 (ref 1.2–2.2)
Albumin: 4.6 g/dL (ref 4.1–5.2)
Alkaline Phosphatase: 94 IU/L (ref 39–117)
BUN/Creatinine Ratio: 13 (ref 9–20)
BUN: 13 mg/dL (ref 6–20)
Bilirubin Total: 0.4 mg/dL (ref 0.0–1.2)
CO2: 21 mmol/L (ref 20–29)
Calcium: 9.4 mg/dL (ref 8.7–10.2)
Chloride: 104 mmol/L (ref 96–106)
Creatinine, Ser: 0.98 mg/dL (ref 0.76–1.27)
GFR calc Af Amer: 123 mL/min/{1.73_m2} (ref >59)
GFR calc non Af Amer: 106 mL/min/{1.73_m2} (ref >59)
Globulin, Total: 2.2 g/dL (ref 1.5–4.5)
Glucose: 89 mg/dL (ref 65–99)
Potassium: 3.9 mmol/L (ref 3.5–5.2)
Sodium: 142 mmol/L (ref 134–144)
Total Protein: 6.8 g/dL (ref 6.0–8.5)

## 2018-10-06 ENCOUNTER — Telehealth: Payer: Self-pay | Admitting: Neurology

## 2018-10-06 NOTE — Telephone Encounter (Signed)
Pt's mother Runett said his appt for infusion is set up for 4/21 at short stay. States he is getting weaker, yesterday he went down but was able to hold onto the door. She is wanting to know if there is something that could be prescibed to help between now and his infusion. Please call to advise

## 2018-10-06 NOTE — Telephone Encounter (Signed)
I called again, left a message again, I will try to call back later.

## 2018-10-06 NOTE — Telephone Encounter (Signed)
I called the mother, I left a message, I will call back later.

## 2018-10-07 NOTE — Telephone Encounter (Signed)
I called the mother a third time, left a message, I have asked her to give [place] a number where she can be reached if she still requires assistance.

## 2018-10-18 ENCOUNTER — Other Ambulatory Visit: Payer: Self-pay | Admitting: Neurology

## 2018-10-20 ENCOUNTER — Other Ambulatory Visit: Payer: Self-pay | Admitting: Neurology

## 2018-10-20 ENCOUNTER — Other Ambulatory Visit (HOSPITAL_COMMUNITY): Payer: Self-pay | Admitting: General Practice

## 2018-10-20 DIAGNOSIS — G35 Multiple sclerosis: Secondary | ICD-10-CM

## 2018-10-20 MED ORDER — SODIUM CHLORIDE 0.9 % IV SOLN: 600.0000 mg | INTRAVENOUS | Status: DC

## 2018-10-20 MED ORDER — SODIUM CHLORIDE 0.9 % IV SOLN: 600.0000 mg | Freq: Once | INTRAVENOUS | Status: DC

## 2018-10-20 MED ORDER — SODIUM CHLORIDE 0.9 % IV SOLN: 100.0000 mg | Freq: Once | INTRAVENOUS | Status: DC

## 2018-10-20 MED ORDER — ACETAMINOPHEN 325 MG PO TABS: 650.0000 mg | ORAL_TABLET | Freq: Once | ORAL | Status: DC

## 2018-10-20 MED ORDER — DIPHENHYDRAMINE HCL 50 MG/ML IJ SOLN: 25.0000 mg | Freq: Once | INTRAMUSCULAR | Status: DC

## 2018-10-21 ENCOUNTER — Other Ambulatory Visit: Payer: Self-pay | Admitting: Neurology

## 2018-10-21 DIAGNOSIS — G35 Multiple sclerosis: Secondary | ICD-10-CM

## 2018-10-22 ENCOUNTER — Other Ambulatory Visit (HOSPITAL_COMMUNITY): Payer: Self-pay | Admitting: General Practice

## 2018-10-25 ENCOUNTER — Telehealth: Payer: Self-pay

## 2018-10-25 NOTE — Telephone Encounter (Signed)
PA has been submitted for Ocrevus via cover my meds.  Key: ([id number])

## 2018-10-26 ENCOUNTER — Other Ambulatory Visit: Payer: Self-pay

## 2018-10-26 ENCOUNTER — Encounter (HOSPITAL_COMMUNITY): Payer: Self-pay

## 2018-10-26 ENCOUNTER — Ambulatory Visit (HOSPITAL_COMMUNITY)
Admission: RE | Admit: 2018-10-26 | Discharge: 2018-10-26 | Disposition: A | Discharge status: Home / Self Care | Payer: Medicare Other | Source: Ambulatory Visit | Attending: Neurology | Admitting: Neurology

## 2018-10-26 ENCOUNTER — Ambulatory Visit (HOSPITAL_COMMUNITY): Payer: Medicare Other

## 2018-10-26 DIAGNOSIS — G35 Multiple sclerosis: Secondary | ICD-10-CM

## 2018-10-26 MED ORDER — SODIUM CHLORIDE 0.9 % IV SOLN
Freq: Once | INTRAVENOUS | Status: AC
  Administered 2018-10-26: 09:00:00 via INTRAVENOUS

## 2018-10-26 MED ORDER — SODIUM CHLORIDE 0.9 % IV SOLN
600.0000 mg | Freq: Once | INTRAVENOUS | Status: AC
  Administered 2018-10-26: 09:00:00 600 mg via INTRAVENOUS
  Filled 2018-10-26: qty 20

## 2018-10-26 MED ORDER — DIPHENHYDRAMINE HCL 50 MG/ML IJ SOLN
25.0000 mg | Freq: Once | INTRAMUSCULAR | Status: AC
  Administered 2018-10-26: 09:00:00 25 mg via INTRAVENOUS
  Filled 2018-10-26: qty 1

## 2018-10-26 MED ORDER — METHYLPREDNISOLONE SODIUM SUCC 125 MG IJ SOLR
100.0000 mg | Freq: Once | INTRAMUSCULAR | Status: AC
  Administered 2018-10-26: 100 mg via INTRAVENOUS
  Filled 2018-10-26: qty 2

## 2018-10-26 MED ORDER — ACETAMINOPHEN 325 MG PO TABS
650.0000 mg | ORAL_TABLET | Freq: Once | ORAL | Status: AC
  Administered 2018-10-26: 650 mg via ORAL
  Filled 2018-10-26: qty 2

## 2018-10-26 MED FILL — OCREVUS 300 MG/10ML SOLN: 300 | 30 days supply | Qty: 20 | Fill #0

## 2018-10-26 NOTE — Telephone Encounter (Signed)
PA approval received. Approval effective 07/27/18-10/25/19.

## 2018-10-26 NOTE — Progress Notes (Signed)
Ocrevus infusion was completed.  Pt tolerated well.  Pt stayed for the 1 hr post infusion observation.  No reaction was noted.  Pt used the urinal several times while here and was given several snacks to eat.  Pt watched TV and slept on and off.  He was observed talking to voices several times (hx.of schizophrenia) while hear.  Pt was taken out via wheelchair when he was done.  His mother was waiting in the lobby.  Left the pt with his mother.  Gave the pt's next appointment for October to his mother.

## 2018-10-26 NOTE — Progress Notes (Addendum)
Spoke with pt's mother, Runett, by phone and informed her pt was doing well and he would be ready for d/c around 1410 today.  She voiced understanding.

## 2018-12-08 ENCOUNTER — Other Ambulatory Visit: Payer: Self-pay | Admitting: Neurology

## 2019-03-02 ENCOUNTER — Telehealth: Payer: Self-pay | Admitting: Neurology

## 2019-03-02 NOTE — Telephone Encounter (Signed)
I called this patient and spoke with his mother and got consent for pt to participate in [place] virtual visit for his 8/31 appt.

## 2019-03-07 ENCOUNTER — Telehealth: Payer: Medicare Other | Admitting: Neurology

## 2019-03-15 ENCOUNTER — Other Ambulatory Visit: Payer: Self-pay | Admitting: Neurology

## 2019-03-27 NOTE — Progress Notes (Deleted)
Virtual Visit via Video Note  I connected with Brandon [name] on 03/27/19 at  1:15 PM EDT by a video enabled telemedicine application and verified that I am speaking with the correct person using two identifiers.  Location: Patient: *** Provider: ***   I discussed the limitations of evaluation and management by telemedicine and the availability of in person appointments. The patient expressed understanding and agreed to proceed.  History of Present Illness: 09/02/2018 Dr. Jannifer Franklin: Brandon [name] is a 28 year old right-handed white male with a history of relapsing remitting multiple sclerosis and a history of schizophrenia.  The patient comes in today with his mother.  The patient is on Ocrevus, he is tolerating the medication well.  He has not had any worsening problems with speech, vision, numbness or weakness of the extremities, or any change in balance.  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  The patient is not doing well with his schizophrenia according to the mother.  The patient continues to have frequent headache, the mother also has a history of migraine.  He was placed on Depakote but this resulted in sedation and drooling.  The patient has stopped the medication.  He remains on Eliquis secondary to a pulmonary embolism that occurred approximately a year ago.  The patient comes to this office for an evaluation.   Observations/Objective:   Assessment and Plan:   Follow Up Instructions:    I discussed the assessment and treatment plan with the patient. The patient was provided an opportunity to ask questions and all were answered. The patient agreed with the plan and demonstrated an understanding of the instructions.   The patient was advised to call back or seek an in-person evaluation if the symptoms worsen or if the condition fails to improve as anticipated.  I provided *** minutes of non-face-to-face time during this  encounter.   Suzzanne Cloud, NP

## 2019-03-28 ENCOUNTER — Telehealth: Payer: Self-pay | Admitting: Neurology

## 2019-03-28 NOTE — Telephone Encounter (Signed)
This patient did not log on for VV today, may have had technology difficulties. Can the patient be rescheduled, could be VV anytime if they prefer.

## 2019-03-28 NOTE — Telephone Encounter (Signed)
I called and spoke to  Mother remade appt with SS/NP 04-18-19 at 1415.  She was given instructions, mychart support #. To call and make sure set up. She verbalized understanding.

## 2019-03-28 NOTE — Telephone Encounter (Signed)
Patients mother has requested a call from a nurse. Please call and advise.

## 2019-03-28 NOTE — Telephone Encounter (Signed)
Patients mother called and stated no one ever called for their 1:15 apt today and she never received the email with the link, she is needing help getting on for apt. I reached out to [place] who stated she resent the link.

## 2019-04-15 ENCOUNTER — Other Ambulatory Visit: Payer: Self-pay | Admitting: Neurology

## 2019-04-15 DIAGNOSIS — G35 Multiple sclerosis: Secondary | ICD-10-CM

## 2019-04-17 NOTE — Progress Notes (Signed)
Virtual Visit via Video Note  I connected with Brandon [name] on 04/18/19 at  2:15 PM EDT by a video enabled telemedicine application and verified that I am speaking with the correct person using two identifiers.  Location: Patient: At his home Provider: In the office   I discussed the limitations of evaluation and management by telemedicine and the availability of in person appointments. The patient expressed understanding and agreed to proceed.  History of Present Illness: 04/18/2019 SS: Brandon [name] is a 28 year old male with history of relapsing remitting multiple sclerosis and history of schizophrenia. He presents today for follow-up via virtual visit accompanied by his mother.  He remains on Ocrevus, his next infusion is due the 23rd of this month.  He indicates he likes Ocrevus and feels that it is working well.  He denies any new issues with his vision, numbness or weakness in his extremities, bowels or bladder, or balance.  He continues to feel off balance at times.  He feels like his left foot gets hung.  Whenever he ambulates, he may hold onto the walls.  He does use a walker or wheelchair.  His mother indicates he had a fall in the bathroom 2 months ago, and she had to call EMS to pick him up.  He was not injured.  He remains on Eliquis for history of pulmonary embolism.  He indicates his headaches have been doing quite well while taking Topamax.  He has also been started on metformin.  He has close follow-up with his psychiatrist and primary care doctor.  09/02/2018 Dr. Jannifer Franklin: Brandon [name] is a 28 year old right-handed white male with a history of relapsing remitting multiple sclerosis and a history of schizophrenia.  The patient comes in today with his mother.  The patient is on Ocrevus, he is tolerating the medication well.  He has not had any worsening problems with speech, vision, numbness or weakness of the extremities, or any change in balance.  He does have a chronic gait  disorder, he has not had any recent falls, he will stumble on occasion.  The patient is not doing well with his schizophrenia according to the mother.  The patient continues to have frequent headache, the mother also has a history of migraine.  He was placed on Depakote but this resulted in sedation and drooling.  The patient has stopped the medication.  He remains on Eliquis secondary to a pulmonary embolism that occurred approximately a year ago.  The patient comes to this office for an evaluation.   Observations/Objective: Is alert, answers questions, history is provided by his mother, facial symmetry noted, speech is slurred, symmetric shoulder shrug, no arm drift, reports weakness to left arm and leg.  Able to touch finger-to-nose repeatedly bilaterally.  Gait appears intact, he was able to ambulate in the room without holding onto walls or assistive device  Assessment and Plan: 1.  Relapsing remitting multiple sclerosis 2.  Migraine headache  He has continued to do well since last seen.  He is scheduled for an Ocrevus infusion at [facility] on October 23.  I will order MRI of the brain, routine lab work while taking Ocrevus (he will come to the office to have lab work done in the next few weeks).  He will require sedation for MRI of the brain, I will place the order once the scan is scheduled (he has been ordered Xanax in the past). His headaches have done well while taking Topamax.  He will continue  taking Topamax 50 mg at bedtime.  I will also place an order for a left AFO brace.  His mother indicates this was to be done at last visit, his left foot gets hung, causing him to stumble (will place in the mail).  He will follow-up in 6 months or sooner if needed.   Follow Up Instructions: 10/19/2019 2:45   I discussed the assessment and treatment plan with the patient. The patient was provided an opportunity to ask questions and all were answered. The patient agreed with the plan and demonstrated  an understanding of the instructions.   The patient was advised to call back or seek an in-person evaluation if the symptoms worsen or if the condition fails to improve as anticipated.  I provided 25 minutes of non-face-to-face time during this encounter.  Otila Kluver, DNP  [facility] [address] [place], [place] [address] [phone]

## 2019-04-18 ENCOUNTER — Telehealth (INDEPENDENT_AMBULATORY_CARE_PROVIDER_SITE_OTHER): Payer: Medicare Other | Admitting: Neurology

## 2019-04-18 ENCOUNTER — Encounter: Payer: Self-pay | Admitting: Neurology

## 2019-04-18 ENCOUNTER — Telehealth: Payer: Self-pay | Admitting: Neurology

## 2019-04-18 DIAGNOSIS — G43909 Migraine, unspecified, not intractable, without status migrainosus: Secondary | ICD-10-CM | POA: Diagnosis not present

## 2019-04-18 DIAGNOSIS — G35 Multiple sclerosis: Secondary | ICD-10-CM

## 2019-04-18 MED ORDER — TOPIRAMATE 50 MG PO TABS: ORAL_TABLET | ORAL | 1 refills | Status: DC

## 2019-04-18 MED FILL — TOPIRAMATE 50 MG TABLET: 50 | 90 days supply | Qty: 90 | Fill #0

## 2019-04-18 NOTE — Telephone Encounter (Signed)
Medicare/medicaid order sent to [facility]. No auth they will reach out to the patient to schedule.  °

## 2019-04-18 NOTE — Progress Notes (Signed)
I have read the note, and I agree with the clinical assessment and plan.  Charles K Willis   

## 2019-04-19 ENCOUNTER — Other Ambulatory Visit (HOSPITAL_COMMUNITY): Payer: Self-pay | Admitting: General Practice

## 2019-04-20 ENCOUNTER — Other Ambulatory Visit (HOSPITAL_COMMUNITY): Payer: Self-pay | Admitting: General Practice

## 2019-04-20 MED FILL — OCREVUS 300 MG/10ML SOLN: 300 | 30 days supply | Qty: 20 | Fill #1

## 2019-04-29 ENCOUNTER — Other Ambulatory Visit: Payer: Self-pay

## 2019-04-29 ENCOUNTER — Ambulatory Visit (HOSPITAL_COMMUNITY)
Admission: RE | Admit: 2019-04-29 | Discharge: 2019-04-29 | Disposition: A | Discharge status: Home / Self Care | Payer: Medicare Other | Source: Ambulatory Visit | Attending: Neurology | Admitting: Neurology

## 2019-04-29 DIAGNOSIS — G35 Multiple sclerosis: Secondary | ICD-10-CM | POA: Diagnosis present

## 2019-04-29 MED ORDER — SODIUM CHLORIDE 0.9 % IV SOLN
Freq: Once | INTRAVENOUS | Status: AC
  Administered 2019-04-29: 08:00:00 via INTRAVENOUS

## 2019-04-29 MED ORDER — ACETAMINOPHEN 325 MG PO TABS
650.0000 mg | ORAL_TABLET | Freq: Once | ORAL | Status: AC
  Administered 2019-04-29: 650 mg via ORAL
  Filled 2019-04-29: qty 2

## 2019-04-29 MED ORDER — METHYLPREDNISOLONE SODIUM SUCC 125 MG IJ SOLR
100.0000 mg | Freq: Once | INTRAMUSCULAR | Status: AC
  Administered 2019-04-29: 100 mg via INTRAVENOUS
  Filled 2019-04-29: qty 2

## 2019-04-29 MED ORDER — DIPHENHYDRAMINE HCL 50 MG/ML IJ SOLN
25.0000 mg | Freq: Once | INTRAMUSCULAR | Status: AC
  Administered 2019-04-29: 25 mg via INTRAVENOUS
  Filled 2019-04-29: qty 1

## 2019-04-29 MED ORDER — SODIUM CHLORIDE 0.9 % IV SOLN
600.0000 mg | Freq: Once | INTRAVENOUS | Status: AC
  Administered 2019-04-29: 600 mg via INTRAVENOUS
  Filled 2019-04-29: qty 20

## 2019-04-29 NOTE — Progress Notes (Signed)
Upon arrival Pt very cooperative and pleasant.  After premeds Pt became mildly aggitated and started hitting his head but settled quickly with his mother's instruction.  At 1230 became more agitated and aggressive getting out of bed and yelling obscenities. Pt settled after several minutes of mother talking to him; however, less cooperative throughout the afternoon. Tolerated infusion without further issues.  Attempt to reassign Pt to [facility] for next 6 month infusion of Ocrevus due October 29 2019. Left message on their answering service and called Dr Tobey Grim office (closed for Fri afternoon) to notify.

## 2019-05-03 NOTE — Progress Notes (Signed)
Called and spoke with [employer].  Informed her of Ramesh's next appointment at the [facility], November 01, 798.  Gave her the address and phone number of the facility.  She voiced understanding. Called her on 05/03/19, at 1030.

## 2019-05-06 ENCOUNTER — Other Ambulatory Visit: Payer: Self-pay | Admitting: Neurology

## 2019-05-10 ENCOUNTER — Other Ambulatory Visit: Payer: Medicare Other

## 2019-05-10 ENCOUNTER — Other Ambulatory Visit: Payer: Self-pay | Admitting: Neurology

## 2019-05-10 ENCOUNTER — Ambulatory Visit
Admission: RE | Admit: 2019-05-10 | Discharge: 2019-05-10 | Disposition: A | Discharge status: Home / Self Care | Payer: Medicare Other | Source: Ambulatory Visit | Attending: Neurology | Admitting: Neurology

## 2019-05-10 ENCOUNTER — Other Ambulatory Visit: Payer: Self-pay

## 2019-05-10 DIAGNOSIS — G35 Multiple sclerosis: Secondary | ICD-10-CM

## 2019-05-13 DIAGNOSIS — Z7901 Long term (current) use of anticoagulants: Secondary | ICD-10-CM | POA: Insufficient documentation

## 2019-05-17 ENCOUNTER — Telehealth: Payer: Self-pay | Admitting: *Deleted

## 2019-05-17 NOTE — Telephone Encounter (Signed)
LVM requesting call back for MRI results. 

## 2019-05-18 ENCOUNTER — Other Ambulatory Visit: Payer: Self-pay | Admitting: Neurology

## 2019-05-19 ENCOUNTER — Telehealth: Payer: Self-pay | Admitting: *Deleted

## 2019-05-19 NOTE — Telephone Encounter (Signed)
-----   Message from Suzzanne Cloud, NP sent at 05/15/2019  3:36 PM EST ----- Please call the patient. MRI is stable from prior in July 2019, no acute findings.   IMPRESSION:   MRI brian (without) demonstrating: - Moderate-severe periventricular and subcortical chronic demyelinating disease. Stable compared to 01/25/18.  - No acute findings.

## 2019-05-30 NOTE — Telephone Encounter (Signed)
Pt mother was given MRI results

## 2019-05-30 NOTE — Telephone Encounter (Signed)
Noted  

## 2019-06-14 MED FILL — LATANOPROST 0.005% OPTH SOL: 0.005 | 75 days supply | Qty: 8 | Fill #0

## 2019-06-21 ENCOUNTER — Other Ambulatory Visit (HOSPITAL_COMMUNITY)
Admission: RE | Admit: 2019-06-21 | Discharge: 2019-06-21 | Disposition: A | Discharge status: Home / Self Care | Payer: Medicare Other | Source: Ambulatory Visit | Attending: Oral Surgery | Admitting: Oral Surgery

## 2019-06-21 DIAGNOSIS — Z20828 Contact with and (suspected) exposure to other viral communicable diseases: Secondary | ICD-10-CM | POA: Insufficient documentation

## 2019-06-21 DIAGNOSIS — Z01812 Encounter for preprocedural laboratory examination: Secondary | ICD-10-CM | POA: Diagnosis present

## 2019-06-22 LAB — NOVEL CORONAVIRUS, NAA (HOSP ORDER, SEND-OUT TO REF LAB; TAT 18-24 HRS): SARS-CoV-2, NAA: NOT DETECTED

## 2019-06-23 ENCOUNTER — Other Ambulatory Visit: Payer: Self-pay

## 2019-06-23 ENCOUNTER — Encounter (HOSPITAL_COMMUNITY): Payer: Self-pay | Admitting: Oral Surgery

## 2019-06-23 MED ORDER — DEXTROSE 5 % IV SOLN
3.0000 g | INTRAVENOUS | Status: AC
  Administered 2019-06-24: 3 g via INTRAVENOUS
  Filled 2019-06-23: qty 3

## 2019-06-23 NOTE — Progress Notes (Signed)
Spoke with pt's mother (DPR on file). Pt has hx of HTN, MS, Schizophrenia and Bipolar disorder. Pt's mother will need to be with pt during pre-op because of his cognitive issues. Pt is on Metformin, but mother states it's not for Diabetes, it's due to the medications that he takes can increase his blood sugar.   Pt is on Eliquis, due to hx of a PE. Pt's last dose was 06/21/19 per his mother.  Pt's Covid test done on 06/21/19 and it's negative. Pt's mother states pt has been in quarantine since.   [place] [place] office (pt's PCP) for recent EKG, none available.

## 2019-06-24 ENCOUNTER — Ambulatory Visit (HOSPITAL_COMMUNITY): Payer: Medicare Other | Admitting: Vascular Surgery

## 2019-06-24 ENCOUNTER — Encounter (HOSPITAL_COMMUNITY): Payer: Self-pay | Admitting: Oral Surgery

## 2019-06-24 ENCOUNTER — Ambulatory Visit (HOSPITAL_COMMUNITY)
Admission: RE | Admit: 2019-06-24 | Discharge: 2019-06-24 | Disposition: A | Discharge status: Home / Self Care | Payer: Medicare Other | Attending: Oral Surgery | Admitting: Oral Surgery

## 2019-06-24 ENCOUNTER — Encounter (HOSPITAL_COMMUNITY)
Admission: RE | Disposition: A | Discharge status: Home / Self Care | Payer: Self-pay | Source: Home / Self Care | Attending: Oral Surgery

## 2019-06-24 ENCOUNTER — Other Ambulatory Visit: Payer: Self-pay

## 2019-06-24 DIAGNOSIS — Z6838 Body mass index (BMI) 38.0-38.9, adult: Secondary | ICD-10-CM | POA: Insufficient documentation

## 2019-06-24 DIAGNOSIS — K029 Dental caries, unspecified: Secondary | ICD-10-CM | POA: Diagnosis present

## 2019-06-24 DIAGNOSIS — Z8673 Personal history of transient ischemic attack (TIA), and cerebral infarction without residual deficits: Secondary | ICD-10-CM | POA: Insufficient documentation

## 2019-06-24 DIAGNOSIS — E669 Obesity, unspecified: Secondary | ICD-10-CM | POA: Insufficient documentation

## 2019-06-24 DIAGNOSIS — F17200 Nicotine dependence, unspecified, uncomplicated: Secondary | ICD-10-CM | POA: Diagnosis not present

## 2019-06-24 DIAGNOSIS — Z86711 Personal history of pulmonary embolism: Secondary | ICD-10-CM | POA: Insufficient documentation

## 2019-06-24 DIAGNOSIS — G35 Multiple sclerosis: Secondary | ICD-10-CM | POA: Insufficient documentation

## 2019-06-24 DIAGNOSIS — I10 Essential (primary) hypertension: Secondary | ICD-10-CM | POA: Insufficient documentation

## 2019-06-24 HISTORY — DX: Other pulmonary embolism without acute cor pulmonale: I26.99

## 2019-06-24 HISTORY — PX: TOOTH EXTRACTION: SHX859

## 2019-06-24 HISTORY — DX: Other constipation: K59.09

## 2019-06-24 LAB — BASIC METABOLIC PANEL
Anion gap: 12 (ref 5–15)
BUN: 11 mg/dL (ref 6–20)
CO2: 20 mmol/L — ABNORMAL LOW (ref 22–32)
Calcium: 9.3 mg/dL (ref 8.9–10.3)
Chloride: 109 mmol/L (ref 98–111)
Creatinine, Ser: 1.22 mg/dL (ref 0.61–1.24)
GFR calc Af Amer: >60 mL/min (ref >60)
GFR calc non Af Amer: >60 mL/min (ref >60)
Glucose, Bld: 89 mg/dL (ref 70–99)
Potassium: 4 mmol/L (ref 3.5–5.1)
Sodium: 141 mmol/L (ref 135–145)

## 2019-06-24 LAB — CBC
HCT: 44.3 % (ref 39.0–52.0)
Hemoglobin: 13.7 g/dL (ref 13.0–17.0)
MCH: 26.3 pg (ref 26.0–34.0)
MCHC: 30.9 g/dL (ref 30.0–36.0)
MCV: 85 fL (ref 80.0–100.0)
Platelets: 148 10*3/uL — ABNORMAL LOW (ref 150–400)
RBC: 5.21 MIL/uL (ref 4.22–5.81)
RDW: 13.4 % (ref 11.5–15.5)
WBC: 5.2 10*3/uL (ref 4.0–10.5)
nRBC: 0 % (ref 0.0–0.2)

## 2019-06-24 LAB — PROTIME-INR
INR: 1 (ref 0.8–1.2)
Prothrombin Time: 13.3 seconds (ref 11.4–15.2)

## 2019-06-24 SURGERY — DENTAL RESTORATION/EXTRACTIONS
Anesthesia: General | Site: Mouth

## 2019-06-24 MED ORDER — LIDOCAINE-EPINEPHRINE 2 %-1:100000 IJ SOLN
INTRAMUSCULAR | Status: AC
  Filled 2019-06-24: qty 1

## 2019-06-24 MED ORDER — MIDAZOLAM HCL 2 MG/2ML IJ SOLN
INTRAMUSCULAR | Status: AC
  Filled 2019-06-24: qty 2

## 2019-06-24 MED ORDER — FENTANYL CITRATE (PF) 250 MCG/5ML IJ SOLN
INTRAMUSCULAR | Status: AC
  Filled 2019-06-24: qty 5

## 2019-06-24 MED ORDER — STERILE WATER FOR IRRIGATION IR SOLN
Status: DC | PRN
  Administered 2019-06-24: 1000 mL

## 2019-06-24 MED ORDER — PROPOFOL 10 MG/ML IV BOLUS
INTRAVENOUS | Status: DC | PRN
  Administered 2019-06-24: 200 mg via INTRAVENOUS

## 2019-06-24 MED ORDER — LIDOCAINE-EPINEPHRINE 2 %-1:100000 IJ SOLN
INTRAMUSCULAR | Status: DC | PRN
  Administered 2019-06-24: 16 mL

## 2019-06-24 MED ORDER — AMOXICILLIN 500 MG PO CAPS: 500.0000 mg | ORAL_CAPSULE | Freq: Three times a day (TID) | ORAL | 0 refills | Status: DC

## 2019-06-24 MED ORDER — 0.9 % SODIUM CHLORIDE (POUR BTL) OPTIME
TOPICAL | Status: DC | PRN
  Administered 2019-06-24: 1000 mL

## 2019-06-24 MED ORDER — ROCURONIUM BROMIDE 100 MG/10ML IV SOLN
INTRAVENOUS | Status: DC | PRN
  Administered 2019-06-24: 30 mg via INTRAVENOUS

## 2019-06-24 MED ORDER — OXYCODONE-ACETAMINOPHEN 5-325 MG PO TABS
ORAL_TABLET | ORAL | Status: AC
  Filled 2019-06-24: qty 1

## 2019-06-24 MED ORDER — SUGAMMADEX SODIUM 500 MG/5ML IV SOLN
INTRAVENOUS | Status: AC
  Filled 2019-06-24: qty 5

## 2019-06-24 MED ORDER — OXYMETAZOLINE HCL 0.05 % NA SOLN
NASAL | Status: AC
  Filled 2019-06-24: qty 90

## 2019-06-24 MED ORDER — FENTANYL CITRATE (PF) 100 MCG/2ML IJ SOLN: 25.0000 ug | INTRAMUSCULAR | Status: DC | PRN

## 2019-06-24 MED ORDER — SUGAMMADEX SODIUM 200 MG/2ML IV SOLN
INTRAVENOUS | Status: DC | PRN
  Administered 2019-06-24: 300 mg via INTRAVENOUS

## 2019-06-24 MED ORDER — ONDANSETRON HCL 4 MG/2ML IJ SOLN
INTRAMUSCULAR | Status: DC | PRN
  Administered 2019-06-24: 4 mg via INTRAVENOUS

## 2019-06-24 MED ORDER — OXYCODONE-ACETAMINOPHEN 5-325 MG PO TABS
1.0000 | ORAL_TABLET | ORAL | Status: DC | PRN
  Administered 2019-06-24: 12:00:00 1 via ORAL

## 2019-06-24 MED ORDER — PHENYLEPHRINE 40 MCG/ML (10ML) SYRINGE FOR IV PUSH (FOR BLOOD PRESSURE SUPPORT)
PREFILLED_SYRINGE | INTRAVENOUS | Status: DC | PRN
  Administered 2019-06-24: 80 ug via INTRAVENOUS

## 2019-06-24 MED ORDER — SODIUM CHLORIDE 0.9 % IV SOLN
INTRAVENOUS | Status: AC | PRN
  Administered 2019-06-24: 1000 mL

## 2019-06-24 MED ORDER — LIDOCAINE 2% (20 MG/ML) 5 ML SYRINGE
INTRAMUSCULAR | Status: DC | PRN
  Administered 2019-06-24: 100 mg via INTRAVENOUS

## 2019-06-24 MED ORDER — FENTANYL CITRATE (PF) 250 MCG/5ML IJ SOLN
INTRAMUSCULAR | Status: DC | PRN
  Administered 2019-06-24: 75 ug via INTRAVENOUS

## 2019-06-24 MED ORDER — SUCCINYLCHOLINE CHLORIDE 20 MG/ML IJ SOLN
INTRAMUSCULAR | Status: DC | PRN
  Administered 2019-06-24: 200 mg via INTRAVENOUS
  Administered 2019-06-24: 40 mg via INTRAVENOUS

## 2019-06-24 MED ORDER — OXYCODONE-ACETAMINOPHEN 5-325 MG PO TABS: 1.0000 | ORAL_TABLET | ORAL | 0 refills | Status: DC | PRN

## 2019-06-24 MED ORDER — MIDAZOLAM HCL 5 MG/5ML IJ SOLN
INTRAMUSCULAR | Status: DC | PRN
  Administered 2019-06-24: 1 mg via INTRAVENOUS

## 2019-06-24 MED ORDER — DEXAMETHASONE SODIUM PHOSPHATE 10 MG/ML IJ SOLN
INTRAMUSCULAR | Status: DC | PRN
  Administered 2019-06-24: 10 mg via INTRAVENOUS

## 2019-06-24 MED ORDER — OXYMETAZOLINE HCL 0.05 % NA SOLN
NASAL | Status: DC | PRN
  Administered 2019-06-24: 2 via NASAL

## 2019-06-24 MED ORDER — LACTATED RINGERS IV SOLN: INTRAVENOUS | Status: DC

## 2019-06-24 SURGICAL SUPPLY — 31 items
BLADE SURG 15 STRL LF DISP TIS (BLADE) ×1 IMPLANT
BLADE SURG 15 STRL SS (BLADE) ×1
BUR CROSS CUT FISSURE 1.6 (BURR) ×2 IMPLANT
BUR EGG ELITE 4.0 (BURR) ×2 IMPLANT
CANISTER SUCT 3000ML PPV (MISCELLANEOUS) ×2 IMPLANT
COVER SURGICAL LIGHT HANDLE (MISCELLANEOUS) ×2 IMPLANT
DRAPE U-SHAPE 76X120 STRL (DRAPES) ×2 IMPLANT
GAUZE PACKING FOLDED 2  STR (GAUZE/BANDAGES/DRESSINGS) ×1
GAUZE PACKING FOLDED 2 STR (GAUZE/BANDAGES/DRESSINGS) ×1 IMPLANT
GLOVE BIO SURGEON STRL SZ 6.5 (GLOVE) ×1 IMPLANT
GLOVE BIO SURGEON STRL SZ7.5 (GLOVE) ×2 IMPLANT
GLOVE BIOGEL PI IND STRL 6.5 (GLOVE) IMPLANT
GLOVE BIOGEL PI INDICATOR 6.5 (GLOVE) ×1
GOWN STRL REUS W/ TWL LRG LVL3 (GOWN DISPOSABLE) ×1 IMPLANT
GOWN STRL REUS W/ TWL XL LVL3 (GOWN DISPOSABLE) ×1 IMPLANT
GOWN STRL REUS W/TWL LRG LVL3 (GOWN DISPOSABLE) ×1
GOWN STRL REUS W/TWL XL LVL3 (GOWN DISPOSABLE) ×1
IV NS 1000ML (IV SOLUTION) ×1
IV NS 1000ML BAXH (IV SOLUTION) ×1 IMPLANT
KIT BASIN OR (CUSTOM PROCEDURE TRAY) ×2 IMPLANT
KIT TURNOVER KIT B (KITS) ×2 IMPLANT
NDL HYPO 25GX1X1/2 BEV (NEEDLE) ×2 IMPLANT
NEEDLE HYPO 25GX1X1/2 BEV (NEEDLE) ×4 IMPLANT
NS IRRIG 1000ML POUR BTL (IV SOLUTION) ×2 IMPLANT
PAD ARMBOARD 7.5X6 YLW CONV (MISCELLANEOUS) ×3 IMPLANT
SLEEVE IRRIGATION ELITE 7 (MISCELLANEOUS) ×2 IMPLANT
SUT CHROMIC 3 0 PS 2 (SUTURE) ×2 IMPLANT
SYR CONTROL 10ML LL (SYRINGE) ×2 IMPLANT
TRAY ENT MC OR (CUSTOM PROCEDURE TRAY) ×2 IMPLANT
TUBING IRRIGATION (MISCELLANEOUS) ×2 IMPLANT
YANKAUER SUCT BULB TIP NO VENT (SUCTIONS) ×2 IMPLANT

## 2019-06-24 NOTE — H&P (Signed)
HISTORY AND PHYSICAL  Brandon [name] is a 28 y.o. male patient with CC: painful teeth  No diagnosis found.  Past Medical History:  Diagnosis Date  . ADHD (attention deficit hyperactivity disorder)   . Bipolar 1 disorder (Brandon [name])   . Chronic back pain   . Chronic constipation   . Chronic neck pain   . Hypertension   . Multiple sclerosis (Brandon [name]) 05/20/2013   left sided weakness, dysarthria  . Non-compliance   . Obesity   . Pulmonary embolism (Brandon [name])   . Schizophrenia (Brandon [name])   . Stroke Brandon [name])    left sided deficits - pt's mother denies this    Current Facility-Administered Medications  Medication Dose Route Frequency Provider Last Rate Last Admin  . ceFAZolin (ANCEF) 3 g in dextrose 5 % 50 mL IVPB  3 g Intravenous To [facility]-Surg Brandon [name], Brandon [name], DDS      . lactated ringers infusion   Intravenous Continuous Belinda Block, MD       No Known Allergies Active Problems:   * No active hospital problems. *  Vitals: Blood pressure 128/77, pulse 60, temperature 98.5 F (36.9 C), temperature source Oral, resp. rate 18, height 6\' 1"  (1.854 m), weight 131.5 kg, SpO2 98 %. Lab results:No results found for this or any previous visit (from the past 14 hour(s)). Radiology Results: No results found. General appearance: alert, cooperative, no distress and moderately obese Head: Normocephalic, without obvious abnormality, atraumatic Eyes: negative Nose: Nares normal. Septum midline. Mucosa normal. No drainage or sinus tenderness. Throat: Dental caries teeth #1, 16, 17, 19, 32. No purulence, fluctuance, edema. Pharynx clear. Neck: no adenopathy and supple, symmetrical, trachea midline Resp: clear to auscultation bilaterally Cardio: regular rate and rhythm, S1, S2 normal, no murmur, click, rub or gallop  Assessment:28 Y M HTN, MS, ADHD, Schizophrenia, bipolar, Obesity, PE, CVA with symptomatic carious teeth.  Plan: Multiple dental extractions with GA. Day surgery.   Brandon [name] 06/24/2019

## 2019-06-24 NOTE — Op Note (Signed)
NAME: NEKO, BOYAJIAN MEDICAL RECORD [id number] ACCOUNT [id number] DATE OF BIRTH:24-Jul-1990 FACILITY: [facility] LOCATION: [facility] PHYSICIAN:Jerian Morais M. Takumi Din, DDS  OPERATIVE REPORT  DATE OF PROCEDURE:  06/24/2019  PREOPERATIVE DIAGNOSIS:  Nonrestorable teeth numbers 1, 16, 17, 19, 32 secondary to dental caries.  POSTOPERATIVE DIAGNOSIS:  Nonrestorable teeth numbers 1, 16, 17, 19, 32 secondary to dental caries plus tooth 1 not present.    PROCEDURE:  Extraction teeth 16, 17, 19, 32.  SURGEON:  Diona Browner, DDS  ANESTHESIA:  General, Dr. Carlota Raspberry attending, oral intubation.  DESCRIPTION OF PROCEDURE:  The patient was taken to the operating room and placed on the table in supine position.  General anesthesia was administered intravenously.  Nasal intubation was attempted x2, but was unsuccessful and oral tube was then placed  with no difficulty.  The tube was taped.  The eyes were protected.  The patient was draped for surgery.  A timeout was performed.  The posterior pharynx was suctioned and a throat pack was placed, 2% lidocaine with 1:100,000 epinephrine was infiltrated  in an inferior alveolar block on the right and left sides and in the area of tooth #16 both buccally and palatally.  A mirror was used to examine the tooth 1 area.  There had been a small fragment of tooth left at the initial exam approximately 1 month  ago, but that piece was no longer present so no surgery was performed in the 1 site.  Then, a bite block was placed on the right side of the mouth.  A 15 blade was used to make an incision around teeth 16, 17, and 19.  The teeth were elevated with a 301  elevator and removed from the mouth with the dental forceps.  The sockets were curetted, irrigated, and closed with 3-0 chromic.  Then, the endotracheal tube was repositioned to the left side of the mouth and stabilized and then the bite block was  repositioned and a 15 blade was used to make an incision around tooth 32.   The tooth was elevated with a 301 elevator and removed with the 151 forceps; however, both medial and distal root fractured at the root tip area upon removal with a forceps, so  the [employer] handpiece was used to remove bone from around this tooth until the root tip pick and root tip suction could be used to remove the root tips.  Then, the sockets were curetted, irrigated, and closed with 3-0 chromic.  The oral cavity was then  irrigated and suctioned and a throat pack was removed.  The patient was left in care of anesthesia for extubation with plans for discharge home after recovery room.  ESTIMATED BLOOD LOSS:  Minimal.  COMPLICATIONS:  None.  SPECIMENS:  None.  TN/NUANCE  D:06/24/2019 T:06/24/2019 JOB:[id number]

## 2019-06-24 NOTE — Anesthesia Preprocedure Evaluation (Addendum)
Anesthesia Evaluation  Patient identified by MRN, date of birth, ID band Patient awake    Reviewed: Allergy & Precautions, NPO status , Patient's Chart, lab work & pertinent test results  Airway Mallampati: II  TM Distance: >3 FB     Dental   Pulmonary Current Smoker and Patient abstained from smoking.,    breath sounds clear to auscultation       Cardiovascular hypertension,  Rhythm:Regular Rate:Normal     Neuro/Psych    GI/Hepatic negative GI ROS, Neg liver ROS,   Endo/Other  negative endocrine ROS  Renal/GU negative Renal ROS     Musculoskeletal   Abdominal   Peds  Hematology   Anesthesia Other Findings   Reproductive/Obstetrics                            Anesthesia Physical Anesthesia Plan  ASA: II  Anesthesia Plan: General   Post-op Pain Management:    Induction: Intravenous  PONV Risk Score and Plan: 2 and Ondansetron and Midazolam  Airway Management Planned: Video Laryngoscope Planned and Nasal ETT  Additional Equipment:   Intra-op Plan:   Post-operative Plan:   Informed Consent: I have reviewed the patients History and Physical, chart, labs and discussed the procedure including the risks, benefits and alternatives for the proposed anesthesia with the patient or authorized representative who has indicated his/her understanding and acceptance.     Dental advisory given  Plan Discussed with: CRNA and Anesthesiologist  Anesthesia Plan Comments:         Anesthesia Quick Evaluation

## 2019-06-24 NOTE — Op Note (Signed)
06/24/2019  10:50 AM  PATIENT:  Brandon [name]  28 y.o. male  PRE-OPERATIVE DIAGNOSIS:  NON RESTORABLE TEETH # 1, 16, 17, 19, 32  POST-OPERATIVE DIAGNOSIS:  SAME + no tooth #1  PROCEDURE:  Procedure(s): /EXTRACTION OF TEETH NUMBER  SIXTEEN, SEVENTEEN, NINETEEN, THIRTY-TWO  SURGEON:  Surgeon(s): Diona Browner, DDS  ANESTHESIA:   local and general  EBL:  minimal  DRAINS: none   SPECIMEN:  No Specimen  COUNTS:  YES  PLAN OF CARE: Discharge to home after PACU  PATIENT DISPOSITION:  PACU - hemodynamically stable.   PROCEDURE DETAILS: Dictation # [id number]  Gae Bon, DMD 06/24/2019 10:50 AM

## 2019-06-24 NOTE — Anesthesia Postprocedure Evaluation (Signed)
Anesthesia Post Note  Patient: Brandon [name]  Procedure(s) Performed: DENTAL RESTORATION/EXTRACTION OF TEETH NUMBER ONE, SIXTEEN, SEVENTEEN, NINETEEN, THIRTY-TWO (N/A Mouth)     Patient location during evaluation: PACU Anesthesia Type: General Level of consciousness: awake Pain management: pain level controlled Respiratory status: spontaneous breathing Cardiovascular status: stable Postop Assessment: no apparent nausea or vomiting Anesthetic complications: no    Last Vitals:  Vitals:   06/24/19 1150 06/24/19 1200  BP:  (!) 138/98  Pulse: 66 66  Resp: 17 17  Temp:  (!) 36.3 C  SpO2: 98% 99%    Last Pain:  Vitals:   06/24/19 1200  TempSrc:   PainSc: 0-No pain                 Madisson Kulaga

## 2019-06-24 NOTE — Anesthesia Procedure Notes (Signed)
Procedure Name: Intubation Date/Time: 06/24/2019 10:12 AM Performed by: Janene Harvey, CRNA Pre-anesthesia Checklist: Patient identified, Emergency Drugs available, Suction available and Patient being monitored Patient Re-evaluated:Patient Re-evaluated prior to induction Oxygen Delivery Method: Circle system utilized Preoxygenation: Pre-oxygenation with 100% oxygen Induction Type: IV induction Ventilation: Mask ventilation without difficulty Laryngoscope Size: Glidescope and 4 Grade View: Grade I Tube type: Oral Tube size: 7.0 mm Number of attempts: 1 Airway Equipment and Method: Stylet and Oral airway Placement Confirmation: ETT inserted through vocal cords under direct vision,  positive ETCO2 and breath sounds checked- equal and bilateral Secured at: 23 cm Tube secured with: Tape Dental Injury: Teeth and Oropharynx as per pre-operative assessment  Comments: Afrin given in both nostrils in preop. IV induction, nasal airway (70F) easily placed in right nare. 7.5 nasal ett passed in nare easily, DL with glidescope, grade I view, glidescope screen went black/out. Nasal ett removed and nasal airway easily replaced. Pt masked with sevo, spo2 >95%. New glidescope to OR. 7.5 nasal ett easily passed in right nare, posterior orapharynx now bloody, still grade I view of glottic opening. Unable to pass ett with assistance of magills. Elected to use oral ett, dentist present. 7.0 ett easily passed with glidescope 4.

## 2019-06-24 NOTE — Transfer of Care (Signed)
Immediate Anesthesia Transfer of Care Note  Patient: Brandon [name]  Procedure(s) Performed: DENTAL RESTORATION/EXTRACTION OF TEETH NUMBER ONE, SIXTEEN, SEVENTEEN, NINETEEN, THIRTY-TWO (N/A Mouth)  Patient Location: PACU  Anesthesia Type:General  Level of Consciousness: awake  Airway & Oxygen Therapy: Patient Spontanous Breathing and Patient connected to face mask oxygen  Post-op Assessment: Report given to RN and Post -op Vital signs reviewed and stable  Post vital signs: Reviewed  Last Vitals:  Vitals Value Taken Time  BP 145/101 06/24/19 1115  Temp    Pulse 78 06/24/19 1117  Resp 12 06/24/19 1117  SpO2 100 % 06/24/19 1117  Vitals shown include unvalidated device data.  Last Pain:  Vitals:   06/24/19 0724  TempSrc:   PainSc: 0-No pain      Patients Stated Pain Goal: 7 (67/20/94 7096)  Complications: No apparent anesthesia complications

## 2019-07-29 ENCOUNTER — Other Ambulatory Visit: Payer: Self-pay | Admitting: Neurology

## 2019-08-22 MED FILL — LATANOPROST 0.005% OPTH SOL: 0.005 | 75 days supply | Qty: 8 | Fill #1

## 2019-08-30 DIAGNOSIS — Z0279 Encounter for issue of other medical certificate: Secondary | ICD-10-CM

## 2019-09-27 ENCOUNTER — Other Ambulatory Visit: Payer: Self-pay | Admitting: Neurology

## 2019-10-17 ENCOUNTER — Other Ambulatory Visit: Payer: Self-pay

## 2019-10-17 ENCOUNTER — Other Ambulatory Visit: Payer: Self-pay | Admitting: Neurology

## 2019-10-17 MED ORDER — OCREVUS 300 MG/10ML IV SOLN: INTRAVENOUS | 1 refills | Status: DC

## 2019-10-17 MED FILL — OCREVUS 300 MG/10ML SOLN: 300 | 180 days supply | Qty: 20 | Fill #0

## 2019-10-19 ENCOUNTER — Ambulatory Visit: Payer: Medicare Other | Admitting: Neurology

## 2019-10-31 ENCOUNTER — Telehealth: Payer: Self-pay | Admitting: Neurology

## 2019-10-31 ENCOUNTER — Other Ambulatory Visit: Payer: Self-pay | Admitting: Neurology

## 2019-10-31 DIAGNOSIS — G35 Multiple sclerosis: Secondary | ICD-10-CM

## 2019-10-31 MED FILL — LATANOPROST 0.005% OPTH SOL: 0.005 | 75 days supply | Qty: 8 | Fill #2

## 2019-10-31 NOTE — Telephone Encounter (Signed)
I called Brandon [name] at the patient care center. She stated pt will be coming tomorrow for the first time for ocrevus. She just needs ocrevus orders and the pre medication to be put in the computer. I stated message will be sent to Dr. Anne Hahn to put in computer.

## 2019-10-31 NOTE — Telephone Encounter (Signed)
I will place the orders for Ocrevus.

## 2019-10-31 NOTE — Telephone Encounter (Signed)
RN Morrie Sheldon requesting a call back [phone]) to discuss orders for pts infusion tomorrow morning

## 2019-11-01 ENCOUNTER — Ambulatory Visit (HOSPITAL_COMMUNITY)
Admission: RE | Admit: 2019-11-01 | Discharge: 2019-11-01 | Disposition: A | Discharge status: Home / Self Care | Payer: Medicare Other | Source: Ambulatory Visit | Attending: Internal Medicine | Admitting: Internal Medicine

## 2019-11-01 ENCOUNTER — Other Ambulatory Visit: Payer: Self-pay

## 2019-11-01 DIAGNOSIS — G35 Multiple sclerosis: Secondary | ICD-10-CM | POA: Insufficient documentation

## 2019-11-01 MED ORDER — SODIUM CHLORIDE 0.9 % IV SOLN
INTRAVENOUS | Status: DC | PRN
  Administered 2019-11-01: 250 mL via INTRAVENOUS

## 2019-11-01 MED ORDER — DIPHENHYDRAMINE HCL 50 MG/ML IJ SOLN
25.0000 mg | INTRAMUSCULAR | Status: DC
  Administered 2019-11-01: 25 mg via INTRAVENOUS
  Filled 2019-11-01: qty 1

## 2019-11-01 MED ORDER — METHYLPREDNISOLONE SODIUM SUCC 125 MG IJ SOLR
100.0000 mg | INTRAMUSCULAR | Status: DC
  Administered 2019-11-01: 100 mg via INTRAVENOUS
  Filled 2019-11-01: qty 2

## 2019-11-01 MED ORDER — ACETAMINOPHEN 325 MG PO TABS
650.0000 mg | ORAL_TABLET | ORAL | Status: DC
  Administered 2019-11-01: 650 mg via ORAL
  Filled 2019-11-01 (×2): qty 2

## 2019-11-01 MED ORDER — SODIUM CHLORIDE 0.9 % IV SOLN
600.0000 mg | INTRAVENOUS | Status: DC
  Administered 2019-11-01: 600 mg via INTRAVENOUS
  Filled 2019-11-01: qty 20

## 2019-11-01 NOTE — Discharge Instructions (Signed)
Ocrelizumab injection °What is this medicine? °OCRELIZUMAB (ok re LIZ ue mab) treats multiple sclerosis. It helps to decrease the number of multiple sclerosis relapses. It is not a cure. °This medicine may be used for other purposes; ask your health care provider or pharmacist if you have questions. °COMMON BRAND NAME(S): OCREVUS °What should I tell my health care provider before I take this medicine? °They need to know if you have any of these conditions: °· cancer °· hepatitis B infection °· other infection (especially a virus infection such as chickenpox, cold sores, or herpes) °· an unusual or allergic reaction to ocrelizumab, other medicines, foods, dyes or preservatives °· pregnant or trying to get pregnant °· breast-feeding °How should I use this medicine? °This medicine is for infusion into a vein. It is given by a health care professional in a hospital or clinic setting. °A special MedGuide will be given to you before each treatment. Be sure to read this information carefully each time. °Talk to your pediatrician regarding the use of this medicine in children. Special care may be needed. °Overdosage: If you think you have taken too much of this medicine contact a poison control center or emergency room at once. °NOTE: This medicine is only for you. Do not share this medicine with others. °What if I miss a dose? °Keep appointments for follow-up doses as directed. It is important not to miss your dose. Call your doctor or health care professional if you are unable to keep an appointment. °What may interact with this medicine? °· alemtuzumab °· daclizumab °· dimethyl fumarate °· fingolimod °· glatiramer °· interferon beta °· live virus vaccines °· mitoxantrone °· natalizumab °· peginterferon beta °· rituximab °· steroid medicines like prednisone or cortisone °· teriflunomide °This list may not describe all possible interactions. Give your health care provider a list of all the medicines, herbs,  non-prescription drugs, or dietary supplements you use. Also tell them if you smoke, drink alcohol, or use illegal drugs. Some items may interact with your medicine. °What should I watch for while using this medicine? °Tell your doctor or healthcare professional if your symptoms do not start to get better or if they get worse. °This medicine can cause serious allergic reactions. To reduce your risk you may need to take medicine before treatment with this medicine. Take your medicine as directed. °Women should inform their doctor if they wish to become pregnant or think they might be pregnant. There is a potential for serious side effects to an unborn child. Talk to your health care professional or pharmacist for more information. Male patients should use effective birth control methods while receiving this medicine and for 6 months after the last dose. °Call your doctor or health care professional for advice if you get a fever, chills or sore throat, or other symptoms of a cold or flu. Do not treat yourself. This drug decreases your body's ability to fight infections. Try to avoid being around people who are sick. °If you have a hepatitis B infection or a history of a hepatitis B infection, talk to your doctor. The symptoms of hepatitis B may get worse if you take this medicine. °In some patients, this medicine may cause a serious brain infection that may cause death. If you have any problems seeing, thinking, speaking, walking, or standing, tell your doctor right away. If you cannot reach your doctor, urgently seek other source of medical care. °This medicine can decrease the response to a vaccine. If you need to get   vaccinated, tell your healthcare professional if you have received this medicine. Extra booster doses may be needed. Talk to your doctor to see if a different vaccination schedule is needed. °Talk to your doctor about your risk of cancer. You may be more at risk for certain types of cancers if you  take this medicine. °What side effects may I notice from receiving this medicine? °Side effects that you should report to your doctor or health care professional as soon as possible: °· allergic reactions like skin rash, itching or hives, swelling of the face, lips, or tongue °· breathing problems °· facial flushing °· fast, irregular heartbeat °· lump or soreness in the breast °· signs and symptoms of herpes such as cold sore, shingles, or genital sores °· signs and symptoms of infection like fever or chills, cough, sore throat, pain or trouble passing urine °· signs and symptoms of low blood pressure like dizziness; feeling faint or lightheaded, falls; unusually weak or tired °· signs and symptoms of progressive multifocal leukoencephalopathy (PML) like changes in vision; clumsiness; confusion; personality changes; weakness on one side of the body °· swelling of the ankles, feet, hands °Side effects that usually do not require medical attention (report these to your doctor or health care professional if they continue or are bothersome): °· back pain °· depressed mood °· diarrhea °· pain, redness, or irritation at site where injected °This list may not describe all possible side effects. Call your doctor for medical advice about side effects. You may report side effects to FDA at 1-800-FDA-1088. °Where should I keep my medicine? °This drug is given in a hospital or clinic and will not be stored at home. °NOTE: This sheet is a summary. It may not cover all possible information. If you have questions about this medicine, talk to your doctor, pharmacist, or health care provider. °© 2020 [employer] (2018-06-28 07:41:53) ° °

## 2019-11-01 NOTE — Progress Notes (Signed)
PATIENT CARE CENTER NOTE  Diagnosis: Multiple Sclerosis    Provider: Stephanie Acre, MD   Procedure: Ocrevus 600 mg IV    Note: Patient received Ocrevus via PIV. Pre-medications given per order and infusion titrated per protocol. Patient tolerated infusion well. Near the end of infusion patient became agitated, with yelling and pacing. [name] in the room with patient and was able to calm patient down. Per mother, patient hears voices due to schizophrenia and gets agitated at times. Patient completed infusion. Vital signs stable. Discharge instructions given. Patient alert, oriented and ambulatory at discharge.

## 2019-11-03 ENCOUNTER — Encounter: Payer: Self-pay | Admitting: Neurology

## 2019-11-03 ENCOUNTER — Other Ambulatory Visit: Payer: Self-pay

## 2019-11-03 ENCOUNTER — Ambulatory Visit (INDEPENDENT_AMBULATORY_CARE_PROVIDER_SITE_OTHER): Payer: Medicare Other | Admitting: Neurology

## 2019-11-03 VITALS — Temp 97.1°F | Ht 74.0 in | Wt 335.6 lb

## 2019-11-03 DIAGNOSIS — G35 Multiple sclerosis: Secondary | ICD-10-CM | POA: Diagnosis not present

## 2019-11-03 DIAGNOSIS — G43909 Migraine, unspecified, not intractable, without status migrainosus: Secondary | ICD-10-CM

## 2019-11-03 DIAGNOSIS — R269 Unspecified abnormalities of gait and mobility: Secondary | ICD-10-CM | POA: Diagnosis not present

## 2019-11-03 NOTE — Progress Notes (Signed)
PATIENT: Brandon [name] DOB: 10-10-1990  REASON FOR VISIT: follow up HISTORY FROM: patient  HISTORY OF PRESENT ILLNESS: Today 11/03/19  Brandon [name] is a 29 year old male with history of relapsing remitting multiple sclerosis and history of schizophrenia.  He remains on Ocrevus. He is on Topamax for headaches, but no longer complains of these.  MRI of the brain in November 2020 was stable when compared to prior in July 2019. He likes Ocrevus, feels it is working well.  Denies any recent falls.  He went to see his eye doctor, was told he has cataracts.  He denies any numbness or weakness in his arms or legs, changes to the bowels or bladder, or changes to the walking.  He never got the left AFO brace, when he walks sometimes feels that his left leg is hung.  He is not very active, but his mom encourages him to walk laps around the house 25 in the morning, and again in the evening.  He does have some constipation, takes a stool softener.  He remains on Eliquis for history of PE.  He received his Ocrevus infusion 2 days ago.  He presents today accompanied by his mother.  HISTORY  04/18/2019 SS: Brandon [name] is a 29 year old male with history of relapsing remitting multiple sclerosis and history of schizophrenia. He presents today for follow-up via virtual visit accompanied by his mother.  He remains on Ocrevus, his next infusion is due the 23rd of this month.  He indicates he likes Ocrevus and feels that it is working well.  He denies any new issues with his vision, numbness or weakness in his extremities, bowels or bladder, or balance.  He continues to feel off balance at times.  He feels like his left foot gets hung.  Whenever he ambulates, he may hold onto the walls.  He does use a walker or wheelchair.  His mother indicates he had a fall in the bathroom 2 months ago, and she had to call EMS to pick him up.  He was not injured.  He remains on Eliquis for history of pulmonary embolism.  He indicates  his headaches have been doing quite well while taking Topamax.  He has also been started on metformin.  He has close follow-up with his psychiatrist and primary care doctor.  REVIEW OF SYSTEMS: Out of a complete 14 system review of symptoms, the patient complains only of the following symptoms, and all other reviewed systems are negative.  Walking difficulty  ALLERGIES: No Known Allergies  HOME MEDICATIONS: Outpatient Medications Prior to Visit  Medication Sig Dispense Refill  . amLODipine (NORVASC) 10 MG tablet Take 10 mg by mouth daily.    Marland Kitchen amoxicillin (AMOXIL) 500 MG capsule Take 1 capsule (500 mg total) by mouth 3 (three) times daily. 21 capsule 0  . apixaban (ELIQUIS) 5 MG TABS tablet Take 5 mg by mouth 2 (two) times daily.    . benztropine (COGENTIN) 1 MG tablet Take 1 mg by mouth at bedtime.    . Calcium Citrate-Vitamin D (CALCIUM + D PO) Take 1 tablet by mouth daily.    Marland Kitchen docusate sodium (COLACE) 100 MG capsule Take 200 mg by mouth at bedtime.    . ferrous gluconate (IRON 27) 240 (27 FE) MG tablet Take 240 mg by mouth daily.    Marland Kitchen ibuprofen (ADVIL) 200 MG tablet Take 400 mg by mouth every 6 (six) hours as needed for moderate pain.    Marland Kitchen latanoprost (XALATAN) 0.005 %  ophthalmic solution Place 1 drop into both eyes at bedtime.    . Melatonin 5 MG TABS Take 10 mg by mouth at bedtime.     . metFORMIN (GLUCOPHAGE-XR) 500 MG 24 hr tablet Take 500 mg by mouth daily with breakfast.    . ocrelizumab (OCREVUS) 300 MG/10ML injection ADMINISTER 300MG  IV ON DAY 1, REPEAT 300MG  IV ON DAY 15, THEN 600MG  IV EVERY 6 MONTHS AFTER THAT 20 mL 1  . ocrelizumab 600 mg in sodium chloride 0.9 % 500 mL Inject 600 mg into the vein every 6 (six) months.    OLANZapine (ZYPREXA) 15 MG tablet Take 15 mg by mouth at bedtime.    oxyCODONE-acetaminophen (PERCOCET) 5-325 MG tablet Take 1 tablet by mouth every 4 (four) hours as needed. 20 tablet 0  . paliperidone (INVEGA) 6 MG 24 hr tablet Take 6 mg by mouth  daily.    . propranolol (INDERAL) 10 MG tablet Take 1 tablet (10 mg total) by mouth 2 (two) times daily. 60 tablet 0  . senna (SENOKOT) 8.6 MG tablet Take 4 tablets by mouth at bedtime.     . traZODone (DESYREL) 150 MG tablet Take 300 mg by mouth at bedtime.    . topiramate (TOPAMAX) 25 MG tablet TAKE TWO TABLETS BY MOUTH NIGHTLY 60 tablet 3   Facility-Administered Medications Prior to Visit  Medication Dose Route Frequency Provider Last Rate Last Admin  . diphenhydrAMINE (BENADRYL) injection 25 mg  25 mg Intravenous Once , MD      . methylPREDNISolone sodium succinate (SOLU-MEDROL) 100 mg in sodium chloride 0.9 % 50 mL IVPB  100 mg Intravenous Once Marland Kitchen, MD      . ocrelizumab (OCREVUS) 600 mg in sodium chloride 0.9 % 250 mL  600 mg Intravenous Once Marland Kitchen, MD        PAST MEDICAL HISTORY: Past Medical History:  Diagnosis Date  . ADHD (attention deficit hyperactivity disorder)   . Bipolar 1 disorder (HCC)   . Chronic back pain   . Chronic constipation   . Chronic neck pain   . Hypertension   . Multiple sclerosis (HCC) 05/20/2013   left sided weakness, dysarthria  . Non-compliance   . Obesity   . Pulmonary embolism (HCC)   . Schizophrenia (HCC)   . Stroke [facility])    left sided deficits - pt's mother denies this    PAST SURGICAL HISTORY: Past Surgical History:  Procedure Laterality Date  . NO PAST SURGERIES    . None    . TOOTH EXTRACTION N/A 06/24/2019   Procedure: DENTAL RESTORATION/EXTRACTION OF TEETH NUMBER ONE, SIXTEEN, SEVENTEEN, NINETEEN, THIRTY-TWO;  Surgeon: 05/22/2013, DDS;  Location: [facility] OR;  Service: Oral Surgery;  Laterality: N/A;    FAMILY HISTORY: Family History  Problem Relation Age of Onset  . Diabetes Mother   . ADD / ADHD Brother     SOCIAL HISTORY: Social History   Socioeconomic History  . Marital status: Single    Spouse name: Not on file  . Number of children: 0  . Years of education: 11th  . Highest  education level: Not on file  Occupational History    Employer: 06/26[employer]    Comment: Disbaled  Tobacco Use  . Smoking status: Current Every Day Smoker    Packs/day: 0.25    Types: Cigarettes  . Smokeless tobacco: Never Used  . Tobacco comment: 3 cigarettes a day  Substance and Sexual Activity  .  Alcohol use: Not Currently    Alcohol/week: 0.0 standard drinks    Comment: "A little bit"   . Drug use: Not Currently    Types: Marijuana    Comment: Last used: unknown   . Sexual activity: Not on file  Other Topics Concern  . Not on file  Social History Narrative   Patient lives at home with his mother.   Disabled.   Education 11 th grade .   Right handed.   Caffeine - one cup daily soda.   Social Determinants of Health   Financial Resource Strain:   . Difficulty of Paying Living Expenses:   Food Insecurity:   . Worried About Programme researcher, broadcasting/film/video in the Last Year:   . Barista in the Last Year:   Transportation Needs:   . Freight forwarder (Medical):   Marland Kitchen Lack of Transportation (Non-Medical):   Physical Activity:   . Days of Exercise per Week:   . Minutes of Exercise per Session:   Stress:   . Feeling of Stress :   Social Connections:   . Frequency of Communication with Friends and Family:   . Frequency of Social Gatherings with Friends and Family:   . Attends Religious Services:   . Active Member of Clubs or Organizations:   . Attends Banker Meetings:   Marland Kitchen Marital Status:   Intimate Partner Violence:   . Fear of Current or Ex-Partner:   . Emotionally Abused:   Marland Kitchen Physically Abused:   . Sexually Abused:    PHYSICAL EXAM  Vitals:   11/03/19 1434  Temp: (!) 97.1 F (36.2 C)  Weight: (!) 335 lb 9.6 oz (152.2 kg)  Height: 6\' 2"  (1.88 m)   Body mass index is 43.09 kg/m.  Generalized: Well developed, in no acute distress   Neurological examination  Mentation: Alert oriented to time, place, most history is provided by his mother.  Follows all commands speech and language fluent, patient appears somewhat disinterested in visit, flat affect Cranial nerve II-XII: Pupils were equal round reactive to light. Extraocular movements were full, visual field were full on confrontational test. Facial sensation and strength were normal. Head turning and shoulder shrug were normal and symmetric. Motor: The motor testing reveals 5 over 5 strength of all 4 extremities. Good symmetric motor tone is noted throughout.  Sensory: Sensory testing is intact to soft touch on all 4 extremities. No evidence of extinction is noted.  Coordination: Cerebellar testing reveals good finger-nose-finger and heel-to-shin bilaterally.  Gait and station: With gait, slightly wide-based, decreased arm swing bilaterally, no assistive device, slight drag of left foot Reflexes: Deep tendon reflexes are symmetric but increased bilaterally  DIAGNOSTIC DATA (LABS, IMAGING, TESTING) - I reviewed patient records, labs, notes, testing and imaging myself where available.  Lab Results  Component Value Date   WBC 5.2 06/24/2019   HGB 13.7 06/24/2019   HCT 44.3 06/24/2019   MCV 85.0 06/24/2019   PLT 148 (L) 06/24/2019      Component Value Date/Time   NA 141 06/24/2019 0739   NA 142 09/02/2018 1029   K 4.0 06/24/2019 0739   CL 109 06/24/2019 0739   CO2 20 (L) 06/24/2019 0739   GLUCOSE 89 06/24/2019 0739   BUN 11 06/24/2019 0739   BUN 13 09/02/2018 1029   CREATININE 1.22 06/24/2019 0739   CALCIUM 9.3 06/24/2019 0739   PROT 6.8 09/02/2018 1029   ALBUMIN 4.6 09/02/2018 1029   AST 12 09/02/2018  1029   ALT 30 09/02/2018 1029   ALKPHOS 94 09/02/2018 1029   BILITOT 0.4 09/02/2018 1029   GFRNONAA >60 06/24/2019 0739   GFRAA >60 06/24/2019 0739   Lab Results  Component Value Date   CHOL 113 01/29/2011   HDL 33 (L) 01/29/2011   LDLCALC 70 01/29/2011   TRIG 52 01/29/2011   CHOLHDL 3.4 01/29/2011   Lab Results  Component Value Date   HGBA1C 5.9 (H)  07/31/2017   Lab Results  Component Value Date   VITAMINB12 696 07/03/2011   Lab Results  Component Value Date   TSH 0.848 01/18/2014      ASSESSMENT AND PLAN 29 y.o. year old male  has a past medical history of ADHD (attention deficit hyperactivity disorder), Bipolar 1 disorder (HCC), Chronic back pain, Chronic constipation, Chronic neck pain, Hypertension, Multiple sclerosis (HCC) (05/20/2013), Non-compliance, Obesity, Pulmonary embolism (HCC), Schizophrenia (HCC), and Stroke (HCC). here with:  1.  Relapsing remitting multiple sclerosis 2.  Gait disturbance 3.  Migraine headache 4.  Schizophrenia  He has done well on Ocrevus.  I will check blood work today.  We will discontinue Topamax, as he no longer complains of headache. I did give him a prescription for a left AFO brace, as he feels his left foot sometimes gets hung with walking.  He will follow-up in 6 months or sooner if needed.  I spent 30 minutes of face-to-face and non-face-to-face time with patient.  This included previsit chart review, lab review, study review, order entry, electronic health record documentation, patient education.  Margie Ege, AGNP-C, DNP 11/03/2019, 3:25 PM [facility] [address] [place], [place] [address] [phone]

## 2019-11-03 NOTE — Progress Notes (Signed)
I have read the note, and I agree with the clinical assessment and plan.  Chalice Philbert K Allegra Cerniglia   

## 2019-11-03 NOTE — Patient Instructions (Signed)
It was great to see you today  Continue the Ocrevus Stop the Topamax Check blood work today  See you back in 6 months

## 2019-11-04 LAB — COMPREHENSIVE METABOLIC PANEL
ALT: 37 IU/L (ref 0–44)
AST: 15 IU/L (ref 0–40)
Albumin/Globulin Ratio: 2.1 (ref 1.2–2.2)
Albumin: 4.2 g/dL (ref 4.1–5.2)
Alkaline Phosphatase: 93 IU/L (ref 39–117)
BUN/Creatinine Ratio: 15 (ref 9–20)
BUN: 18 mg/dL (ref 6–20)
Bilirubin Total: <0.2 mg/dL (ref 0.0–1.2)
CO2: 24 mmol/L (ref 20–29)
Calcium: 9.3 mg/dL (ref 8.7–10.2)
Chloride: 107 mmol/L — ABNORMAL HIGH (ref 96–106)
Creatinine, Ser: 1.22 mg/dL (ref 0.76–1.27)
GFR calc Af Amer: 93 mL/min/{1.73_m2} (ref >59)
GFR calc non Af Amer: 81 mL/min/{1.73_m2} (ref >59)
Globulin, Total: 2 g/dL (ref 1.5–4.5)
Glucose: 75 mg/dL (ref 65–99)
Potassium: 4.4 mmol/L (ref 3.5–5.2)
Sodium: 147 mmol/L — ABNORMAL HIGH (ref 134–144)
Total Protein: 6.2 g/dL (ref 6.0–8.5)

## 2019-11-04 LAB — CBC WITH DIFFERENTIAL/PLATELET
Basophils Absolute: 0 10*3/uL (ref 0.0–0.2)
Basos: 1 %
EOS (ABSOLUTE): 0.2 10*3/uL (ref 0.0–0.4)
Eos: 3 %
Hematocrit: 39.4 % (ref 37.5–51.0)
Hemoglobin: 12.7 g/dL — ABNORMAL LOW (ref 13.0–17.7)
Immature Grans (Abs): 0.1 10*3/uL (ref 0.0–0.1)
Immature Granulocytes: 1 %
Lymphocytes Absolute: 1.9 10*3/uL (ref 0.7–3.1)
Lymphs: 27 %
MCH: 26.7 pg (ref 26.6–33.0)
MCHC: 32.2 g/dL (ref 31.5–35.7)
MCV: 83 fL (ref 79–97)
Monocytes Absolute: 0.6 10*3/uL (ref 0.1–0.9)
Monocytes: 8 %
Neutrophils Absolute: 4.3 10*3/uL (ref 1.4–7.0)
Neutrophils: 60 %
Platelets: 195 10*3/uL (ref 150–450)
RBC: 4.76 x10E6/uL (ref 4.14–5.80)
RDW: 13.2 % (ref 11.6–15.4)
WBC: 7 10*3/uL (ref 3.4–10.8)

## 2019-11-04 LAB — IGG, IGA, IGM
IgA/Immunoglobulin A, Serum: 90 mg/dL (ref 90–386)
IgG (Immunoglobin G), Serum: 834 mg/dL (ref 603–1613)
IgM (Immunoglobulin M), Srm: 14 mg/dL — ABNORMAL LOW (ref 20–172)

## 2019-11-07 DIAGNOSIS — T50905A Adverse effect of unspecified drugs, medicaments and biological substances, initial encounter: Secondary | ICD-10-CM | POA: Insufficient documentation

## 2019-11-09 ENCOUNTER — Telehealth: Payer: Self-pay | Admitting: Neurology

## 2019-11-09 NOTE — Telephone Encounter (Signed)
I LMVM for mother of pt to return call relating to lab results.

## 2019-11-09 NOTE — Telephone Encounter (Signed)
Please call the patient and his mother.  IgG was 834, IgA 90, IgM 14 (low). Based on the low IgM, would recommend adjusting Ocrevus to every 8 months instead of every 6 months. When these levels get too low, he is at increase risk of serious infection. Did run this by Dr. Anne Hahn, who agrees. Otherwise, labs show slightly high sodium level was 147, make sure drinking enough water.

## 2019-11-10 MED ORDER — SODIUM CHLORIDE 0.9 % IV SOLN: INTRAVENOUS | 2 refills | Status: DC

## 2019-11-10 NOTE — Telephone Encounter (Signed)
I called mother of pt relayed the lab results.  Encourage hydration for elev sodium level. IgM low , recommend ocrevus infusion from 6 months to 8 months.  (next dose scheduled at pt care center 05-02-20.  They will be given new order and then should receive call about changing appt.  She verbalized understanding. [phone], [phone]

## 2019-11-10 NOTE — Addendum Note (Signed)
Addended by: Hermenia Fiscal S on: 11/10/2019 11:29 AM   Modules accepted: Orders

## 2019-11-15 ENCOUNTER — Other Ambulatory Visit: Payer: Self-pay | Admitting: Neurology

## 2019-11-15 DIAGNOSIS — G35 Multiple sclerosis: Secondary | ICD-10-CM

## 2019-11-15 NOTE — Telephone Encounter (Signed)
I will change the orders.

## 2019-11-15 NOTE — Telephone Encounter (Signed)
Please send new ocrevus order to pt care center [facility]) that interval changed from 6 months to 8 months.  Thanks.

## 2019-11-27 IMAGING — CT CT CERVICAL SPINE W/O CM
4 of 7 series · 14 of 33 positions shown, 15 images · non-contrast
Comparison: Head CT and cervical spine CT March 12, 2015; brain
MRI July 30, 2017

CLINICAL DATA: Pain following fall.  Multiple sclerosis.

EXAM:
CT HEAD WITHOUT CONTRAST
CT CERVICAL SPINE WITHOUT CONTRAST
TECHNIQUE: Multidetector CT imaging of the head and cervical spine was
performed following the standard protocol without intravenous
contrast. Multiplanar CT image reconstructions of the cervical spine
were also generated.

[Series 4: coronal soft tissue · coronal · 0.36mm/px · 2 of 74 slices shown]
[im 25/74  bone]
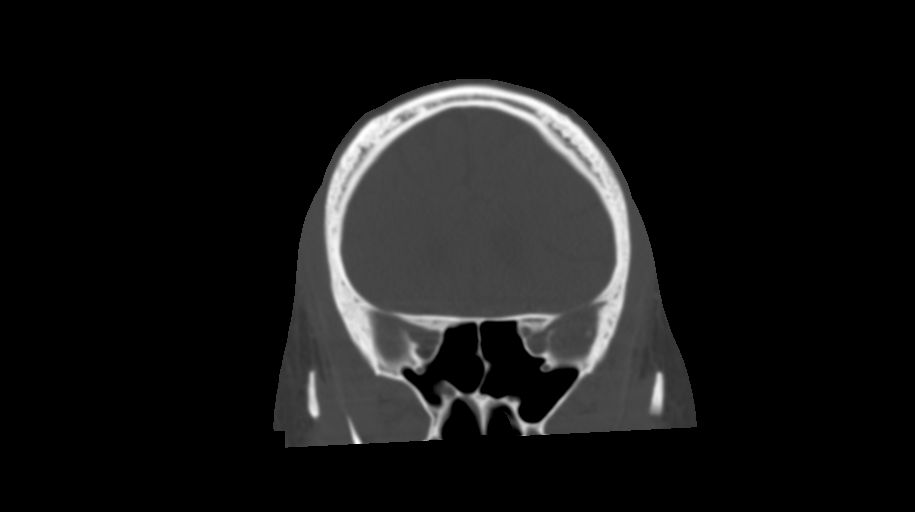
[im 49/74  bone]
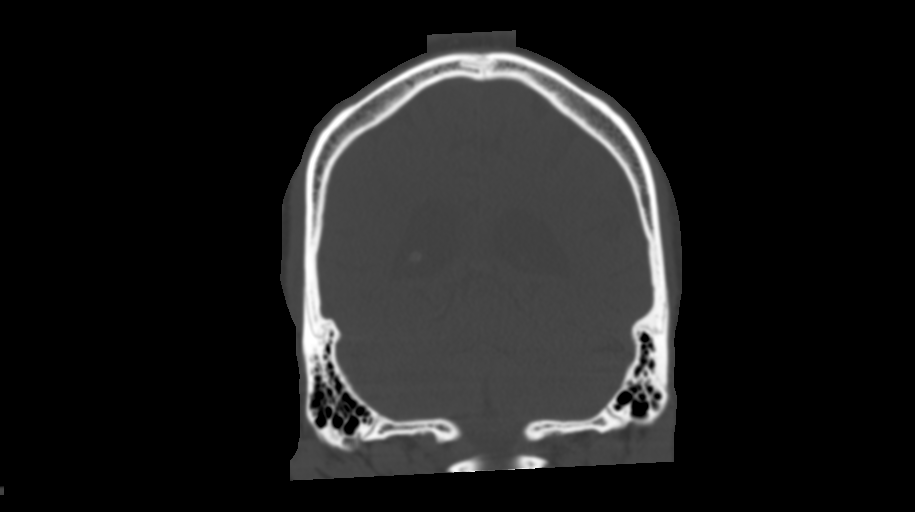

[Series 8: c spine soft · axial · 0.33mm/px · z∈[-289,-181]mm · 4 of 90 slices shown]
[im 18/90  soft-tissue]
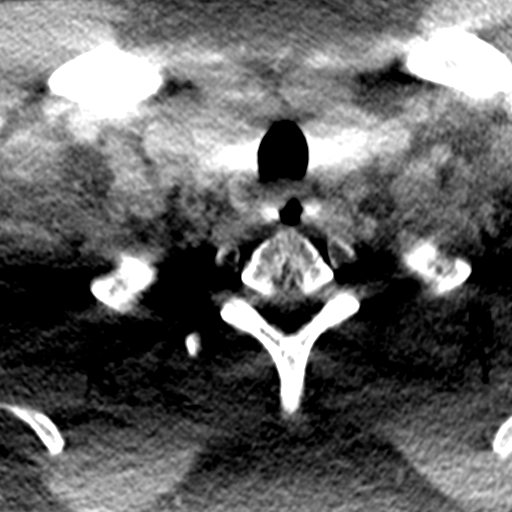
[im 36/90  soft-tissue]
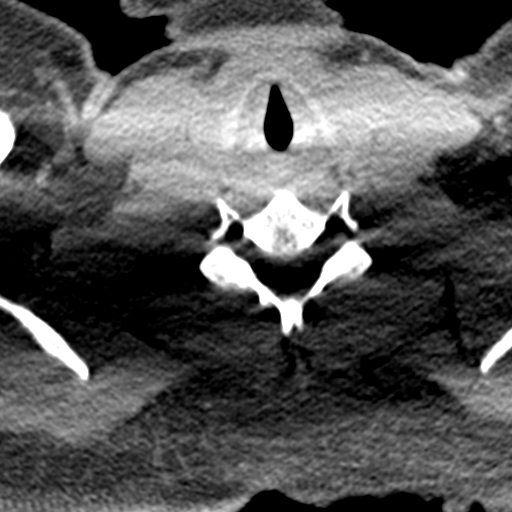
[im 54/90  soft-tissue]
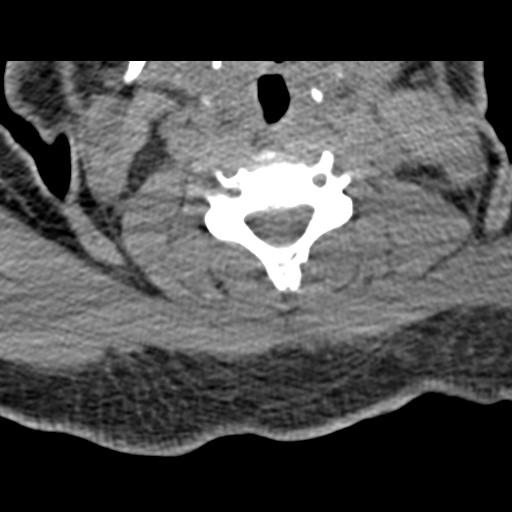
[im 72/90  soft-tissue]
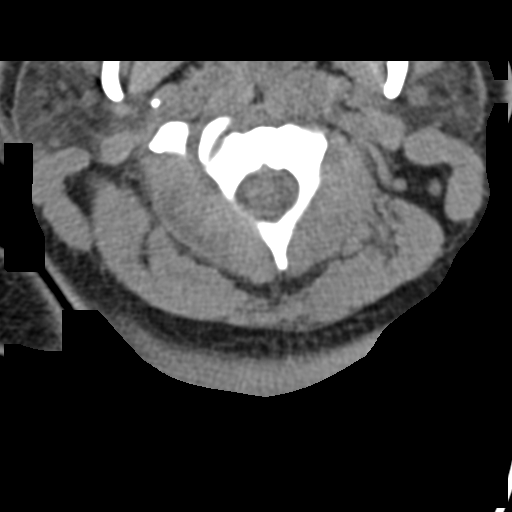

[Series 9: orthogonal bone · axial · 0.34mm/px · z∈[-313,-196]mm · 4 of 103 slices shown, 5 images]
[im 21/103  soft-tissue]
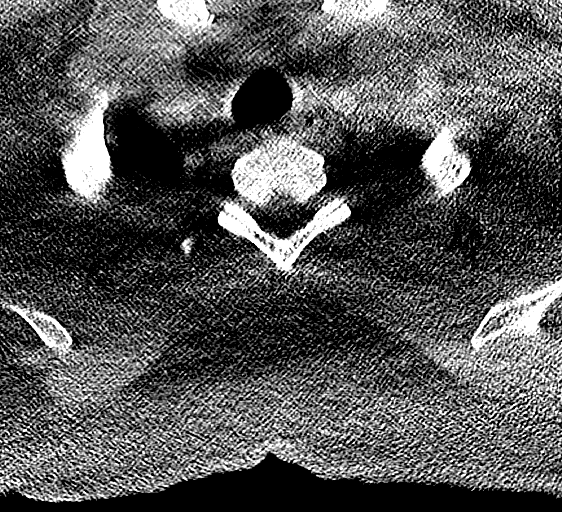
[im 21/103  bone]
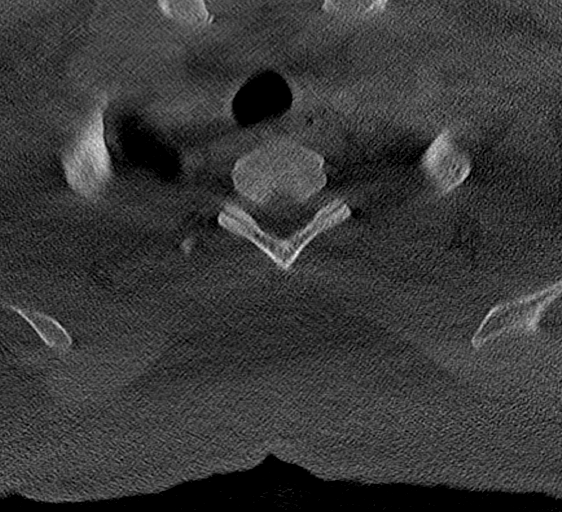
[im 41/103  bone]
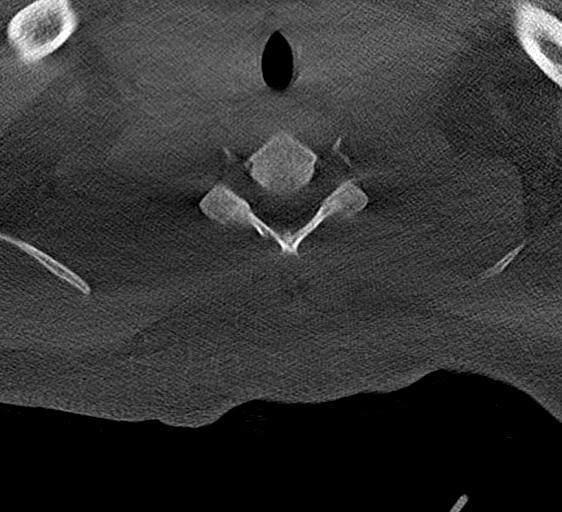
[im 62/103  bone]
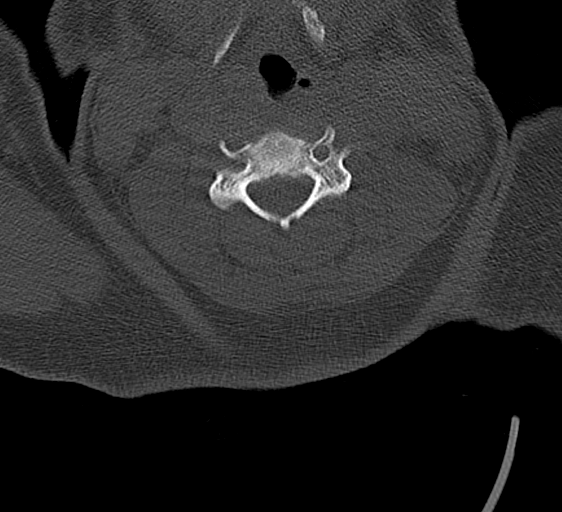
[im 82/103  bone]
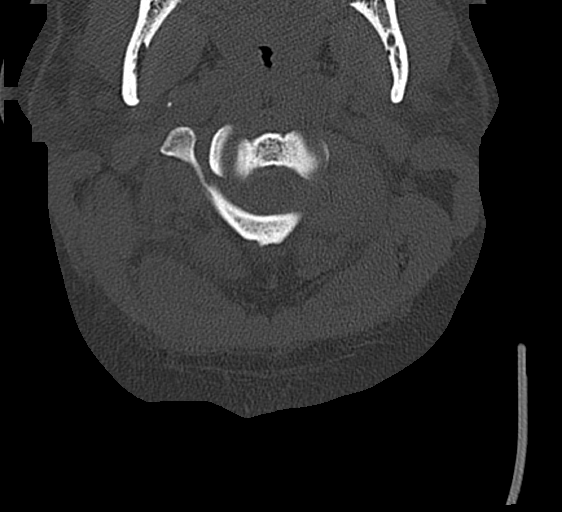

[Series 11: sagittal bone · sagittal · 0.35mm/px · 4 of 55 slices shown]
[im 11/55  bone]
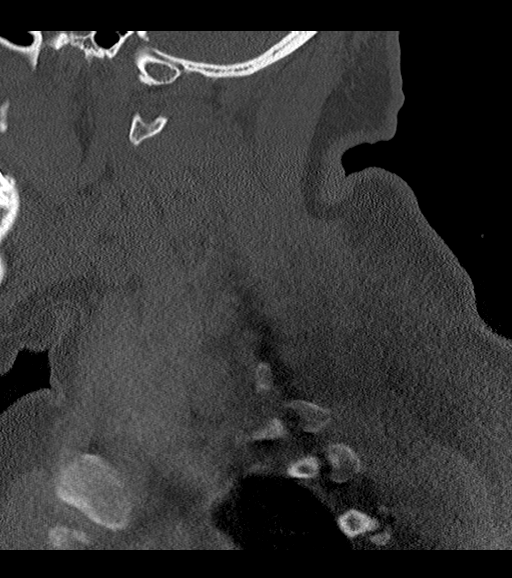
[im 22/55  bone]
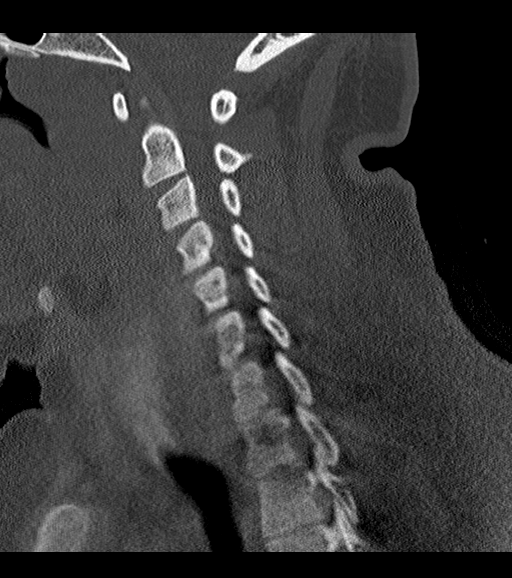
[im 33/55  bone]
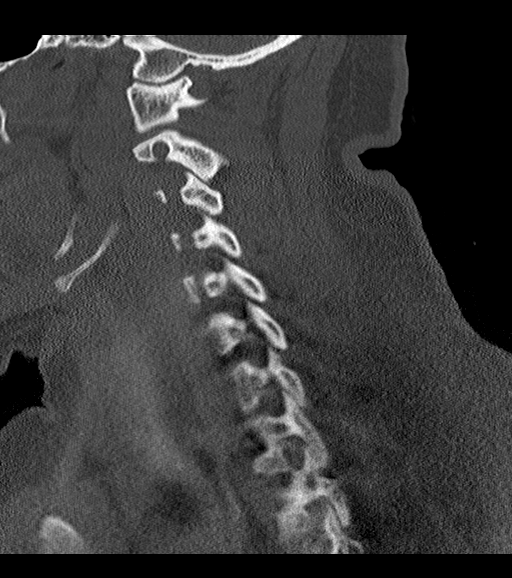
[im 44/55  bone]
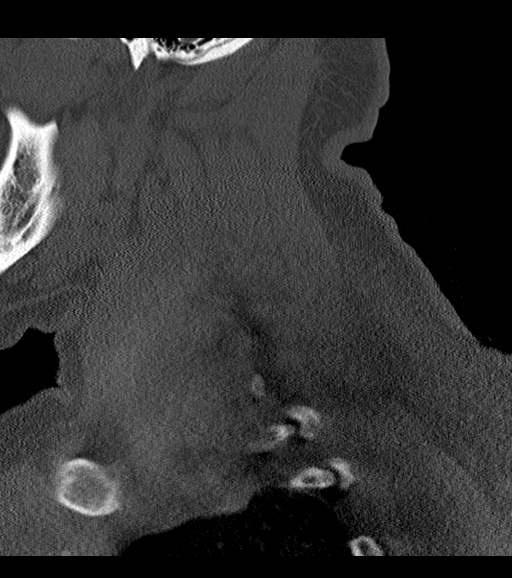

[14 of 33 positions shown; findings below may reference images not displayed]

FINDINGS: CT HEAD FINDINGS

Brain: There is stable diffuse atrophy for age. There is no mass,
hemorrhage, extra-axial fluid collection, or midline shift. There is
widespread decreased attenuation in the centra semiovale with
multiple areas of decreased attenuation oriented perpendicular to
the lateral ventricles consistent with the known multiple sclerosis.
The overall distribution of demyelinating plaque appears stable
compared to prior studies. No acute infarct or new gray-white
compartment is evident on this study.

No hyperdense vessel.  No evident vascular calcification.

Skull: Bony calvarium appears intact.

Sinuses/Orbits: There is slight mucosal thickening in several
ethmoid air cells. There is mild mucosal thickening in the posterior
left maxillary antrum. Other visualized paranasal sinuses are clear.
Orbits appear symmetric bilaterally.

Other: Mastoid air cells are clear.

CT CERVICAL SPINE FINDINGS

Alignment: There is no spondylolisthesis.

Skull base and vertebrae: Skull base and craniocervical junction
regions appear normal. No fracture evident. No blastic or lytic bone
lesions.

Soft tissues and spinal canal: Prevertebral soft tissues and
predental space regions are normal. No paraspinous lesions are
evident. No cord or canal hematoma.

Disc levels: Disk spaces appear normal. No nerve root edema or
effacement. No disc extrusion or stenosis.

Upper chest: Visualized upper lung regions are clear.

Other: None
IMPRESSION: Head CT: Widespread demyelination in the supratentorial white matter
consistent with known multiple sclerosis. No new lesion evident. No
mass or hemorrhage. Mild mucosal thickening in several paranasal
sinuses noted.

CT cervical spine: No fracture or spondylolisthesis. No evident
arthropathy.

## 2020-01-03 ENCOUNTER — Ambulatory Visit (HOSPITAL_COMMUNITY)
Admission: EM | Admit: 2020-01-03 | Discharge: 2020-01-03 | Disposition: A | Discharge status: Home / Self Care | Payer: Medicare Other | Attending: Psychiatry | Admitting: Psychiatry

## 2020-01-03 ENCOUNTER — Other Ambulatory Visit: Payer: Self-pay

## 2020-01-03 DIAGNOSIS — F209 Schizophrenia, unspecified: Secondary | ICD-10-CM | POA: Insufficient documentation

## 2020-01-03 DIAGNOSIS — E669 Obesity, unspecified: Secondary | ICD-10-CM | POA: Diagnosis not present

## 2020-01-03 DIAGNOSIS — G35 Multiple sclerosis: Secondary | ICD-10-CM | POA: Diagnosis not present

## 2020-01-03 DIAGNOSIS — F419 Anxiety disorder, unspecified: Secondary | ICD-10-CM | POA: Insufficient documentation

## 2020-01-03 DIAGNOSIS — F201 Disorganized schizophrenia: Secondary | ICD-10-CM

## 2020-01-03 MED ORDER — PALIPERIDONE ER 6 MG PO TB24: 6.0000 mg | ORAL_TABLET | Freq: Every day | ORAL | 0 refills | Status: DC

## 2020-01-03 NOTE — ED Provider Notes (Signed)
Behavioral Health Medical Screening Exam  Brandon [name] is a 29 y.o. male is 29 year old with MS  And schizophrenia who presents with GPD. Patient denies any thoughts of hurting self , others, denies any hallucinations or paranoia. Mom feels patient is having hallucinations but feels patient is safe at home if restated on Invega and has outpatient follow up. Patient willing to restart medication and come back if there are any safety issuesd Total Time spent with patient: 30 minutes  Psychiatric Specialty Exam  Presentation  General Appearance:Disheveled  Eye Contact:Fair  Speech:Normal Rate  Speech Volume:Decreased  Handedness:No data recorded  Mood and Affect  Mood:Anxious  Affect:Blunt;Congruent   Thought Process  Thought Processes:Coherent  Descriptions of Associations:Intact  Orientation:Full (Time, Place and Person)  Thought Content:Logical;Other (comment) (seems concrete)  Hallucinations:None  Ideas of Reference:No data recorded Suicidal Thoughts:No  Homicidal Thoughts:No   Sensorium  Memory:Immediate Fair;Recent Fair  Judgment:Poor  Insight:Poor   Executive Functions  Concentration:Fair  Attention Span:Fair  Recall:Fair  Fund of Knowledge:Poor  Language:Fair   Psychomotor Activity  Psychomotor Activity:Mannerisms;Restlessness   Assets  Assets:Housing;Social Support   Sleep  Sleep:Fair  Number of hours: No data recorded  Physical Exam: Physical Exam Constitutional:      Appearance: He is obese.  HENT:     Head: Normocephalic and atraumatic.     Nose: Nose normal.     Mouth/Throat:     Mouth: Mucous membranes are moist.     Pharynx: Oropharynx is clear.  Eyes:     Extraocular Movements: Extraocular movements intact.     Conjunctiva/sclera: Conjunctivae normal.     Pupils: Pupils are equal, round, and reactive to light.  Cardiovascular:     Rate and Rhythm: Normal rate and regular rhythm.     Pulses: Normal pulses.      Heart sounds: Normal heart sounds.  Abdominal:     Palpations: Abdomen is soft.  Musculoskeletal:        General: Normal range of motion.     Cervical back: Neck supple.  Skin:    General: Skin is warm.  Neurological:     General: No focal deficit present.     Mental Status: He is alert and oriented to person, place, and time.  Psychiatric:        Mood and Affect: Mood normal.        Behavior: Behavior normal.        Thought Content: Thought content normal.    Review of Systems  Constitutional: Negative.  Negative for fever and malaise/fatigue.  HENT: Negative.  Negative for congestion, hearing loss and sore throat.   Eyes: Negative.  Negative for blurred vision, double vision, discharge and redness.  Respiratory: Negative.  Negative for cough, shortness of breath and wheezing.   Cardiovascular: Negative.  Negative for chest pain and palpitations.  Gastrointestinal: Negative.  Negative for abdominal pain, heartburn and vomiting.  Genitourinary: Negative.   Musculoskeletal: Negative.  Negative for falls, joint pain and myalgias.  Skin: Negative.  Negative for rash.  Neurological: Negative.  Negative for dizziness, seizures, loss of consciousness, weakness and headaches.  Endo/Heme/Allergies: Negative.  Negative for environmental allergies.  Psychiatric/Behavioral: Negative for depression, hallucinations, substance abuse and suicidal ideas. The patient is not nervous/anxious and does not have insomnia.    Blood pressure 127/89, pulse 80, temperature 98.8 F (37.1 C), temperature source Oral, resp. rate 18, SpO2 99 %. There is no height or weight on file to calculate BMI.  Musculoskeletal: Strength &  Muscle Tone: within normal limits Gait & Station: normal Patient leans: N/A   Recommendations:  Based on my evaluation the patient does not appear to have an emergency medical condition.Patient restarted on Invega and to follow up with outpatient on [address]  Nelly Rout,  MD 01/03/2020, 4:09 PM

## 2020-01-03 NOTE — ED Notes (Signed)
Pt was given his AVS, his questions were answered and he received his personal belongings. He was escorted to the front desk to wait for his mother.

## 2020-01-03 NOTE — ED Notes (Signed)
In triage patient was responding to AVH in a seemingly agitated manner. Pt was physically lunging his torso towards [facility] while delivering an aggressive message.

## 2020-01-03 NOTE — BH Assessment (Signed)
Comprehensive Clinical Assessment (CCA) Note  01/03/2020 Brandon [name] [id number]   Patient is a 29 year old male presenting voluntarily to [facility] for assessment via [facility]. He is calm and cooperative during assessment. Patient reports today he was in the kitchen cutting a steak and his mom thought he was trying to stab himself. Patient denies SI/HI/AVH. He reports he was in a psychiatric hospital as a child because "people thought I was crazy." Patient states he used to be seen by [facility] but recently graduated from their program and does not have a new provider. He reports the only medication he is taking now is Trazedone but that he used to be on medication for "the voices." Patient denies any substance use. Patient gives verbal consent for TTS to contact his mother/ legal guardian, Idus Rathke.   Per collateral [phone]: Patient hearing voices x 2 weeks. She states that patient reported to her that he was hearing voices telling him his heart was artificial so he took a knife and put it to his chest like he was going to stab himself. Last week patient stated his brain was artifical and started to bang it. Patient graduated from [facility] 1 month ago and has not had any follow up care. He is seen by [facility] for his MS.  He was previously on [place], which worked well for him. She states at one point he was on a medication that required a lot of blood work and made him lethargic but could not recall the name. She states she wants his medications reviewed and recommendations made. She states patient has never been suicidal or homicidal in the past.  Per Dr. Lucianne Muss this patient does not meet in patient criteria and is psych cleared. Patient has a medication management appointment with [facility] on 7/6. This counselor also discussed open access on Friday from 1:00-5:00 PM with patient and his mother.  Visit Diagnosis:   Schizophrenia   CCA Screening, Triage and Referral  (STR)  Patient Reported Information How did you hear about [place]? Family/Friend  Referral name: Catarino Vold  Referral phone number: [phone]   Whom do you see for routine medical problems? Primary Care  Practice/Facility Name: [facility]  Practice/Facility Phone Number: No data recorded Name of Contact: No data recorded Contact Number: No data recorded Contact Fax Number: No data recorded Prescriber Name: No data recorded Prescriber Address (if known): No data recorded  What Is the Reason for Your Visit/Call Today? AH  How Long Has This Been Causing You Problems? 1 wk - 1 month  What Do You Feel Would Help You the Most Today? Medication   Have You Recently Been in Any Inpatient Treatment (Hospital/Detox/Crisis Center/28-Day Program)? No  Name/Location of Program/Hospital:No data recorded How Long Were You There? No data recorded When Were You Discharged? No data recorded  Have You Ever Received Services From [facility] Before? Yes  Who Do You See at [facility]? [facility]   Have You Recently Had Any Thoughts About Hurting Yourself? Yes  Are You Planning to Commit Suicide/Harm Yourself At This time? No   Have you Recently Had Thoughts About Hurting Someone [name]? No  Explanation: No data recorded  Have You Used Any Alcohol or Drugs in the Past 24 Hours? No  How Long Ago Did You Use Drugs or Alcohol? No data recorded What Did You Use and How Much? No data recorded  Do You Currently Have a Therapist/Psychiatrist? No  Name of Therapist/Psychiatrist:  No data recorded  Have You Been Recently Discharged From Any Office Practice or Programs? Yes  Explanation of Discharge From Practice/Program: Graduated [facility]     CCA Screening Triage Referral Assessment Type of Contact: Face-to-Face  Is this Initial or Reassessment? No data recorded Date Telepsych consult ordered in [facility]:  No data recorded Time Telepsych consult  ordered in [facility]:  No data recorded  Patient Reported Information Reviewed? Yes  Patient Left Without Being Seen? No data recorded Reason for Not Completing Assessment: No data recorded  Collateral Involvement: mother/ legal guardian   Does Patient Have a Court Appointed Legal Guardian? No data recorded Name and Contact of Legal Guardian: No data recorded If Minor and Not Living with Parent(s), Who has Custody? No data recorded Is CPS involved or ever been involved? Never  Is APS involved or ever been involved? Never   Patient Determined To Be At Risk for Harm To Self or Others Based on Review of Patient Reported Information or Presenting Complaint? No  Method: No data recorded Availability of Means: No data recorded Intent: No data recorded Notification Required: No data recorded Additional Information for Danger to Others Potential: No data recorded Additional Comments for Danger to Others Potential: No data recorded Are There Guns or Other Weapons in Your Home? No data recorded Types of Guns/Weapons: No data recorded Are These Weapons Safely Secured?                            No data recorded Who Could Verify You Are Able To Have These Secured: No data recorded Do You Have any Outstanding Charges, Pending Court Dates, Parole/Probation? No data recorded Contacted To Inform of Risk of Harm To Self or Others: No data recorded  Location of Assessment: [facility] Assessment Services   Does Patient Present under Involuntary Commitment? No  IVC Papers Initial File Date: No data recorded  [place] of Residence: [place]   Patient Currently Receiving the Following Services: Not Receiving Services   Determination of Need: No data recorded  Options For Referral: Medication Management     CCA Biopsychosocial  Intake/Chief Complaint:  CCA Intake With Chief Complaint CCA Part Two Date: 01/03/20 Chief Complaint/Presenting Problem: AH Patient's Currently Reported  Symptoms/Problems: NA Individual's Strengths: NA Individual's Preferences: NA Individual's Abilities: NA Type of Services Patient Feels Are Needed: NA Initial Clinical Notes/Concerns: NA  Mental Health Symptoms Depression:  Depression: None  Mania:  Mania: None  Anxiety:   Anxiety: None  Psychosis:  Psychosis: Affective flattening/alogia/avolition, Hallucinations, Duration of symptoms less than six months  Trauma:  Trauma: None  Obsessions:  Obsessions: None  Compulsions:  Compulsions: None  Inattention:  Inattention: None  Hyperactivity/Impulsivity:  Hyperactivity/Impulsivity: N/A  Oppositional/Defiant Behaviors:  Oppositional/Defiant Behaviors: None  Emotional Irregularity:  Emotional Irregularity: None  Other Mood/Personality Symptoms:      Mental Status Exam Appearance and self-care  Stature:  Stature: Average  Weight:  Weight: Overweight  Clothing:  Clothing: Casual  Grooming:  Grooming: Well-groomed  Cosmetic use:  Cosmetic Use: None  Posture/gait:  Posture/Gait: Slumped  Motor activity:  Motor Activity: Slowed  Sensorium  Attention:  Attention: Normal  Concentration:  Concentration: Normal  Orientation:  Orientation: X5  Recall/memory:  Recall/Memory: Normal  Affect and Mood  Affect:  Affect: Flat  Mood:  Mood: Dysphoric  Relating  Eye contact:  Eye Contact: Normal  Facial expression:  Facial Expression:  (Flat)  Attitude toward examiner:  Attitude Toward  Examiner: Cooperative  Thought and Language  Speech flow: Speech Flow: Slow  Thought content:  Thought Content: Appropriate to Mood and Circumstances  Preoccupation:  Preoccupations: None  Hallucinations:  Hallucinations: Auditory  Organization:     Company secretary of Knowledge:  Fund of Knowledge: Average  Intelligence:  Intelligence: Needs investigation  Abstraction:  Abstraction: Abstract  Judgement:  Judgement: Impaired  Reality Testing:  Reality Testing: Variable  Insight:  Insight: Lacking   Decision Making:  Decision Making: Normal  Social Functioning  Social Maturity:  Social Maturity: Responsible  Social Judgement:  Social Judgement: Naive  Stress  Stressors:  Stressors: Transitions  Coping Ability:  Coping Ability: Normal  Skill Deficits:  Skill Deficits: None  Supports:  Supports: Family     Religion: Religion/Spirituality How Might This Affect Treatment?: not assessed  Leisure/Recreation: Leisure / Recreation Do You Have Hobbies?: Yes Leisure and Hobbies: Geologist, engineering  Exercise/Diet: Exercise/Diet Do You Exercise?: No Have You Gained or Lost A Significant Amount of Weight in the Past Six Months?: No Do You Follow a Special Diet?: No Do You Have Any Trouble Sleeping?: No   CCA Employment/Education  Employment/Work Situation: Employment / Work Situation Employment situation: On disability Why is patient on disability: MS, Schizophrenia How long has patient been on disability: UTA What is the longest time patient has a held a job?: NA Where was the patient employed at that time?: NA Has patient ever been in the [employer]?: No  Education: Education Is Patient Currently Attending School?: No Last Grade Completed: 12 Name of High School: NA Did Garment/textile technologist From [employer]?: Yes Did Theme park manager?:  (not assessed) Did You Attend Graduate School?:  (not assessed) Did You Have An Individualized Education Program (IIEP):  (not assessed) Did You Have Any Difficulty At School?:  (not assessed) Patient's Education Has Been Impacted by Current Illness:  (not assessed)   CCA Family/Childhood History  Family and Relationship History: Family history Marital status: Single Are you sexually active?:  (not assessed) What is your sexual orientation?: not assessed Has your sexual activity been affected by drugs, alcohol, medication, or emotional stress?: not assessed Does patient have children?: No  Childhood History:  Childhood History By whom  was/is the patient raised?: Mother Additional childhood history information: NA Description of patient's relationship with caregiver when they were a child: NA Patient's description of current relationship with people who raised him/her: Mother is legal guardian How were you disciplined when you got in trouble as a child/adolescent?: NA Does patient have siblings?: No Did patient suffer any verbal/emotional/physical/sexual abuse as a child?: No Did patient suffer from severe childhood neglect?: No Has patient ever been sexually abused/assaulted/raped as an adolescent or adult?: No Was the patient ever a victim of a crime or a disaster?: No Witnessed domestic violence?: No Has patient been affected by domestic violence as an adult?: No  Child/Adolescent Assessment:     CCA Substance Use  Alcohol/Drug Use: Alcohol / Drug Use Pain Medications: see MAR Prescriptions: see MAR Over the Counter: see MAR History of alcohol / drug use?: No history of alcohol / drug abuse                         ASAM's:  Six Dimensions of Multidimensional Assessment  Dimension 1:  Acute Intoxication and/or Withdrawal Potential:      Dimension 2:  Biomedical Conditions and Complications:      Dimension 3:  Emotional, Behavioral, or Cognitive  Conditions and Complications:     Dimension 4:  Readiness to Change:     Dimension 5:  Relapse, Continued use, or Continued Problem Potential:     Dimension 6:  Recovery/Living Environment:     ASAM Severity Score:    ASAM Recommended Level of Treatment:     Substance use Disorder (SUD)    Recommendations for Services/Supports/Treatments:    DSM5 Diagnoses: Patient Active Problem List   Diagnosis Date Noted  . Migraine headache 04/18/2019  . Generalized weakness   . Pulmonary embolism (HCC) 08/12/2017  . Hyperglycemia 07/31/2017  . Multiple sclerosis exacerbation (HCC) 07/30/2017  . Acute encephalopathy 07/30/2017  . Schizophrenia (HCC)   .  Hallucinations 08/05/2014  . Auditory hallucination   . Left-sided weakness 01/18/2014  . Multiple sclerosis (HCC) 05/20/2013  . 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 Abnormality of gait 11/23/2012  . HTN (hypertension) 07/04/2011  . 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 Obesity, Class III, BMI 40-49.9 (morbid obesity) (HCC) 01/14/2010  . ADHD 01/14/2010    Patient Centered Plan: Patient is on the following Treatment Plan(s):     Referrals to Alternative Service(s): Referred to Alternative Service(s):   Place:   Date:   Time:    Referred to Alternative Service(s):   Place:   Date:   Time:    Referred to Alternative Service(s):   Place:   Date:   Time:    Referred to Alternative Service(s):   Place:   Date:   Time:     Celedonio Miyamoto

## 2020-01-03 NOTE — ED Notes (Signed)
Patient belongings in locker 27 

## 2020-01-10 ENCOUNTER — Other Ambulatory Visit: Payer: Self-pay

## 2020-01-10 ENCOUNTER — Telehealth (HOSPITAL_COMMUNITY): Payer: Medicare Other | Admitting: Psychiatric/Mental Health

## 2020-01-17 ENCOUNTER — Other Ambulatory Visit (HOSPITAL_COMMUNITY): Payer: Self-pay | Admitting: Psychiatry

## 2020-01-17 ENCOUNTER — Other Ambulatory Visit: Payer: Self-pay | Admitting: Neurology

## 2020-01-25 MED FILL — LATANOPROST 0.005% OPTH SOL: 0.005 | 75 days supply | Qty: 8 | Fill #3

## 2020-01-26 ENCOUNTER — Other Ambulatory Visit: Payer: Self-pay | Admitting: Neurology

## 2020-01-27 ENCOUNTER — Telehealth (INDEPENDENT_AMBULATORY_CARE_PROVIDER_SITE_OTHER): Payer: Medicare Other | Admitting: Psychiatry

## 2020-01-27 ENCOUNTER — Other Ambulatory Visit: Payer: Self-pay

## 2020-01-27 ENCOUNTER — Encounter (HOSPITAL_COMMUNITY): Payer: Self-pay | Admitting: Psychiatry

## 2020-01-27 ENCOUNTER — Telehealth (HOSPITAL_COMMUNITY): Payer: Self-pay | Admitting: *Deleted

## 2020-01-27 DIAGNOSIS — F79 Unspecified intellectual disabilities: Secondary | ICD-10-CM

## 2020-01-27 DIAGNOSIS — F201 Disorganized schizophrenia: Secondary | ICD-10-CM | POA: Diagnosis not present

## 2020-01-27 MED ORDER — PALIPERIDONE ER 6 MG PO TB24: 6.0000 mg | ORAL_TABLET | Freq: Every day | ORAL | 1 refills | Status: DC

## 2020-01-27 MED ORDER — TRAZODONE HCL 150 MG PO TABS: ORAL_TABLET | ORAL | 1 refills | Status: DC

## 2020-01-27 MED ORDER — PALIPERIDONE ER 3 MG PO TB24: 3.0000 mg | ORAL_TABLET | Freq: Every day | ORAL | 1 refills | Status: DC

## 2020-01-27 NOTE — Telephone Encounter (Signed)
Mom calling on behalf of patient requesting Trazadone and Hinda Glatter as he is nearly out of medications and his next appt with Toy Cookey NP is on 8/23. He was most recently seen by Dr Lucianne Muss. Will make Dr Lucianne Muss aware of request. Would like RXs called into [facility].

## 2020-01-27 NOTE — Progress Notes (Signed)
Psychiatric Initial Adult Assessment  Virtual Visit via Video Note  I connected with Brandon [name] on 01/27/20 at 10:30 AM EDT by a video enabled telemedicine application and verified that I am speaking with the correct person using two identifiers.  Location: Patient: Home Provider: Clinic   I discussed the limitations of evaluation and management by telemedicine and the availability of in person appointments. The patient expressed understanding and agreed to proceed.  I provided 25 minutes of non-face-to-face time during this encounter. Extensive amount of time spent reviewing the EMR.     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 Unspecified intellectual disabilities  F79     History of Present Illness: 29 year old male contacted today for initial psychiatric evaluation for medication management.   Patient was seen at [facility] after visit on 01/03/20 when he presented with GPD reporting auditory hallucinations for 2 weeks.  The patient has a history of disorganized schizophrenia and is currently prescribed Invega 6 mg at bedtime, Trazodone 300 mg at bedtime, and over-the-counter Melatonin 5 mg at bedtime.  He was discharged after being started back on his medications.  Today, pt was seen with his mother. Patient reports that he is feeling well on his medications, and Invega is helpful in reducing his paranoia and auditory hallucinations.  Patient also endorses adequate sleep with the use of his Trazodone and Melatonin medications.    Patient's mom reports that he is doing well overall but reports that he tends to have increased anxiety and reports auditory  hallucinations during the morning time. She stated that in the past he was taking Olanzapine in the morning, however, that made him to eat excessively causing significant amount of weight gain. Mom and patient are agreeable to add dose of Invega 3 mg in the morning to current medication regimen.  Patient and mother are agreeable to continue all other medications at this time.  [name] also made provider aware that she recently registered the patient to start attending a day program in the future through [facility].  Patient and mom deny any additional concerns at this time.   Mom informed that pt did poorly in school and had an IEP from a young age. Mom is not certain of his exact IQ score but recalls it was lower than expected.   Past Psychiatric History: Schizophrenia  Past Medical History: His medical history is significant for relapsing remitting multiple Sclerosis  Previous Psychotropic Medications: Yes   Substance Abuse History in the last 12 months:  No.  Consequences of Substance Abuse: NA  Past Medical History:  Past Medical History:  Diagnosis Date  . ADHD (attention deficit hyperactivity disorder)   . Bipolar 1 disorder (HCC)   . Chronic back pain   . Chronic constipation   . Chronic neck pain   . Hypertension   . Multiple sclerosis (HCC) 05/20/2013   left sided weakness, dysarthria  . Non-compliance   . Obesity   . Pulmonary embolism (HCC)   . Schizophrenia (HCC)   . Stroke [facility])    left sided deficits - pt's mother denies this  Past Surgical History:  Procedure Laterality Date  . NO PAST SURGERIES    . None    . TOOTH EXTRACTION N/A 06/24/2019   Procedure: DENTAL RESTORATION/EXTRACTION OF TEETH NUMBER ONE, SIXTEEN, SEVENTEEN, NINETEEN, THIRTY-TWO;  Surgeon: Ocie Doyne, DDS;  Location: [facility] OR;  Service: Oral Surgery;  Laterality: N/A;    Family Psychiatric History: Brother- ADHD  Family History:  Family History  Problem Relation Age of  Onset  . Diabetes Mother   . ADD / ADHD Brother     Social History:   Social History   Socioeconomic History  . Marital status: Single    Spouse name: Not on file  . Number of children: 0  . Years of education: 11th  . Highest education level: Not on file  Occupational History    Employer: [employer]    Comment: Disbaled  Tobacco Use  . Smoking status: Current Every Day Smoker    Packs/day: 0.25    Types: Cigarettes  . Smokeless tobacco: Never Used  . Tobacco comment: 3 cigarettes a day  Vaping Use  . Vaping Use: Never used  Substance and Sexual Activity  . Alcohol use: Not Currently    Alcohol/week: 0.0 standard drinks    Comment: "A little bit"   . Drug use: Not Currently    Types: Marijuana    Comment: Last used: unknown   . Sexual activity: Not on file  Other Topics Concern  . Not on file  Social History Narrative   Patient lives at home with his mother.   Disabled.   Education 11 th grade .   Right handed.   Caffeine - one cup daily soda.   Social Determinants of Health   Financial Resource Strain:   . Difficulty of Paying Living Expenses:   Food Insecurity:   . Worried About Programme researcher, broadcasting/film/video in the Last Year:   . Barista in the Last Year:   Transportation Needs:   . Freight forwarder (Medical):   Marland Kitchen Lack of Transportation (Non-Medical):   Physical Activity:   . Days of Exercise per Week:   . Minutes of Exercise per Session:   Stress:   . Feeling of Stress :   Social Connections:   . Frequency of Communication with Friends and Family:   . Frequency of Social Gatherings with Friends and Family:   . Attends Religious Services:   . Active Member of Clubs or Organizations:   . Attends Banker Meetings:   Marland Kitchen Marital Status:     Additional Social History: Lives with Mother  Allergies:  No Known Allergies  Metabolic Disorder Labs: Lab Results  Component Value Date   HGBA1C 5.9 (H) 07/31/2017   MPG 122.63  07/31/2017   MPG 123 04/02/2015   No results found for: PROLACTIN Lab Results  Component Value Date   CHOL 113 01/29/2011   TRIG 52 01/29/2011   HDL 33 (L) 01/29/2011   CHOLHDL 3.4 01/29/2011   VLDL 10 01/29/2011   LDLCALC 70 01/29/2011   Lab Results  Component Value Date   TSH 0.848 01/18/2014    Therapeutic Level Labs: Lab Results  Component Value Date   LITHIUM <0.06 (L) 01/25/2018   No results found for: CBMZ Lab Results  Component Value Date   VALPROATE <10 (L) 07/30/2017    Current Medications: Current Outpatient Medications  Medication Sig Dispense Refill  . amLODipine (NORVASC) 10 MG tablet Take 10 mg by mouth daily.    Marland Kitchen  amoxicillin (AMOXIL) 500 MG capsule Take 1 capsule (500 mg total) by mouth 3 (three) times daily. 21 capsule 0  . apixaban (ELIQUIS) 5 MG TABS tablet Take 5 mg by mouth 2 (two) times daily.    . benztropine (COGENTIN) 1 MG tablet Take 1 mg by mouth at bedtime.    . Calcium Citrate-Vitamin D (CALCIUM + D PO) Take 1 tablet by mouth daily.    Marland Kitchen docusate sodium (COLACE) 100 MG capsule Take 200 mg by mouth at bedtime.    . ferrous gluconate (IRON 27) 240 (27 FE) MG tablet Take 240 mg by mouth daily.    Marland Kitchen ibuprofen (ADVIL) 200 MG tablet Take 400 mg by mouth every 6 (six) hours as needed for moderate pain.    Marland Kitchen latanoprost (XALATAN) 0.005 % ophthalmic solution Place 1 drop into both eyes at bedtime.    . Melatonin 5 MG TABS Take 10 mg by mouth at bedtime.     . metFORMIN (GLUCOPHAGE-XR) 500 MG 24 hr tablet Take 500 mg by mouth daily with breakfast.    . ocrelizumab (OCREVUS) 300 MG/10ML injection ADMINISTER 300MG  IV ON DAY 1, REPEAT 300MG  IV ON DAY 15, THEN 600MG  IV EVERY 6 MONTHS AFTER THAT 20 mL 1  . ocrelizumab in sodium chloride 0.9 % 500 mL CHANGE ::  Ocrevus 600mg  IV EVERY 8 (EIGHT) months. 1 each 2  . oxyCODONE-acetaminophen (PERCOCET) 5-325 MG tablet Take 1 tablet by mouth every 4 (four) hours as needed. 20 tablet 0  . paliperidone (INVEGA) 3  MG 24 hr tablet Take 1 tablet (3 mg total) by mouth daily. 30 tablet 1  . paliperidone (INVEGA) 6 MG 24 hr tablet Take 1 tablet (6 mg total) by mouth daily. 30 tablet 0  . paliperidone (INVEGA) 6 MG 24 hr tablet Take 1 tablet (6 mg total) by mouth at bedtime. 30 tablet 1  . traZODone (DESYREL) 150 MG tablet Take 2 tablets at bedtime 60 tablet 1   Current Facility-Administered Medications  Medication Dose Route Frequency Provider Last Rate Last Admin  . diphenhydrAMINE (BENADRYL) injection 25 mg  25 mg Intravenous Once , MD      . methylPREDNISolone sodium succinate (SOLU-MEDROL) 100 mg in sodium chloride 0.9 % 50 mL IVPB  100 mg Intravenous Once , MD      . ocrelizumab (OCREVUS) 600 mg in sodium chloride 0.9 % 250 mL  600 mg Intravenous Once [phone], MD         Patient leans:  Psychiatric Specialty Exam: Review of Systems  There were no vitals taken for this visit.There is no height or weight on file to calculate BMI.  General Appearance: Disheveled, pt was noted to be obese with some concern for gynecomastia.  Eye Contact:  Fair  Speech:  Slow and Monotonous  Volume:  Decreased  Mood:  Euthymic  Affect:  Restricted  Thought Process:  Goal Directed and Descriptions of Associations: Intact  Orientation:  Full (Time, Place, and Person)  Thought Content:  Hallucinations: Auditory  Suicidal Thoughts:  No  Homicidal Thoughts:  No  Memory:  Immediate;   Good Recent;   Good  Judgement:  Fair  Insight:  Fair  Psychomotor Activity:  Decreased  Concentration:  Concentration: Fair  Recall:  Fair  Fund of Knowledge:Fair  Language: Fair  Akathisia:  Negative  Handed:  Right  AIMS (if indicated):  not done  Assets:  Communication Skills Desire for Improvement Financial Resources/Insurance Housing Social  Support  ADL's:  Intact  Cognition: Impaired,  Mild  Sleep:  Fair    Assessment and Plan: Patient reports that his Invega medication has  helped to reduce his paranoia and auditory hallucinations.  However, his mother mentioned that he experiences increased anxiety and reports auditory hallucinations more during the morning time.  Patient and mother are agreeable to add Invega 3 mg in the morning and continue taking Invega 6 mg at bedtime to optimize efficacy of the medication as well as reduce his daytime anxiety and auditory hallucinations.  Patient and mother are agreeable to continue all other medications as prescribed.  1. Disorganized schizophrenia (HCC)  - Continue traZODone (DESYREL) 150 MG tablet; Take 2 tablets at bedtime  Dispense: 60 tablet; Refill: 1 - Add paliperidone (INVEGA) 3 MG 24 hr tablet; Take 1 tablet (3 mg total) by mouth daily.  Dispense: 30 tablet; Refill: 1 -  Continue paliperidone (INVEGA) 6 MG 24 hr tablet; Take 1 tablet (6 mg total) by mouth at bedtime.  Dispense: 30 tablet; Refill: 1 - Continue OTC Melatonin with Trazodone as mom believes the combination helps him sleep better.  2. Unspecified intellectual disabilities   Pt is seeing a neurologist at [facility] for management of MS. Mom has signed him up for day program at [facility], he is undergoing initial assessment process. F/up in 2 months.   Zena Amos, MD 7/23/202110:57 AM

## 2020-02-27 ENCOUNTER — Ambulatory Visit (HOSPITAL_COMMUNITY): Payer: Medicare Other | Admitting: Psychiatry

## 2020-03-20 ENCOUNTER — Other Ambulatory Visit (HOSPITAL_COMMUNITY): Payer: Self-pay | Admitting: Psychiatry

## 2020-03-20 DIAGNOSIS — F201 Disorganized schizophrenia: Secondary | ICD-10-CM

## 2020-03-22 ENCOUNTER — Other Ambulatory Visit: Payer: Self-pay

## 2020-03-22 ENCOUNTER — Telehealth (INDEPENDENT_AMBULATORY_CARE_PROVIDER_SITE_OTHER): Payer: Medicare Other | Admitting: Psychiatry

## 2020-03-22 ENCOUNTER — Encounter (HOSPITAL_COMMUNITY): Payer: Self-pay | Admitting: Psychiatry

## 2020-03-22 DIAGNOSIS — F201 Disorganized schizophrenia: Secondary | ICD-10-CM

## 2020-03-22 DIAGNOSIS — F79 Unspecified intellectual disabilities: Secondary | ICD-10-CM | POA: Diagnosis not present

## 2020-03-22 MED ORDER — ARIPIPRAZOLE 5 MG PO TABS: 5.0000 mg | ORAL_TABLET | Freq: Every day | ORAL | 0 refills | Status: DC

## 2020-03-22 MED ORDER — DOXEPIN HCL 50 MG PO CAPS: ORAL_CAPSULE | ORAL | 0 refills | Status: DC

## 2020-03-22 NOTE — Progress Notes (Signed)
BH MD/PA/NP OP Progress Note  Virtual Visit via Telephone Note  I connected with Brandon [name] on 03/22/20 at  2:40 PM EDT by telephone and verified that I am speaking with the correct person using two identifiers.  Location: Patient: home Provider: Clinic   I discussed the limitations, risks, security and privacy concerns of performing an evaluation and management service by telephone and the availability of in person appointments. I also discussed with the patient that there may be a patient responsible charge related to this service. The patient expressed understanding and agreed to proceed.   I provided 15 minutes of non-face-to-face time during this encounter.    03/22/2020 2:55 PM Brandon [name]  MRN:  [id number]  Chief Complaint:  As per mom, " he is not doing well." As per patient," I am doing all right."  HPI: Mom reported patient is not doing well.  She stated that he does not sleep at all during the night and often seen talking to himself.  She stated that he hears voices most of the times and as result of this he gets frustrated.  At times he hits his head against the wall.  She stated that he has tried sumatriptan medication in the past and nothing has really helped him much. She stated that his other provider thinks that he should return to her medications. Writer asked mother if patient is ever taken doxepin to help with sleep to which the mother replied she does not think so. Regarding hallucinations, he has taken Abilify in the past but it was discontinued for him no reasons to be mother.  Patient stated that he gets upset because he keeps hearing the voices.  He stated that he tries to stop them and when they do not stop he sometimes hits his head and lashes out. He denies any suicidal or homicidal ideations.  Mom stated that she was going to switch his medications so that he can get some relief as she feels really bad for him.   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 ADHD (attention deficit hyperactivity disorder)   . Bipolar 1 disorder (HCC)   . Chronic back pain   . Chronic constipation   . Chronic neck pain   . Hypertension   . Multiple sclerosis (HCC) 05/20/2013   left sided weakness, dysarthria  . Non-compliance   . Obesity   . Pulmonary embolism (HCC)   . Schizophrenia (HCC)   . Stroke [facility])    left sided deficits - pt's mother denies this    Past Surgical History:  Procedure Laterality Date  . NO PAST SURGERIES    . None    . TOOTH EXTRACTION N/A 06/24/2019   Procedure: DENTAL RESTORATION/EXTRACTION OF TEETH NUMBER ONE, SIXTEEN, SEVENTEEN, NINETEEN, THIRTY-TWO;  Surgeon: Ocie Doyne, DDS;  Location: [facility] OR;  Service: Oral Surgery;  Laterality: N/A;    Family Psychiatric History: Brother- ADHD  Family History:  Family History  Problem Relation Age of Onset  . Diabetes Mother   . ADD / ADHD Brother     Social History:  Social History   Socioeconomic History  . Marital status: Single    Spouse name: Not on file  . Number of children: 0  . Years  of education: 11th  . Highest education level: Not on file  Occupational History    Employer: [employer]    Comment: Disbaled  Tobacco Use  . Smoking status: Current Every Day Smoker    Packs/day: 0.25    Types: Cigarettes  . Smokeless tobacco: Never Used  . Tobacco comment: 3 cigarettes a day  Vaping Use  . Vaping Use: Never used  Substance and Sexual Activity  . Alcohol use: Not Currently    Alcohol/week: 0.0 standard drinks    Comment: "A little bit"   . Drug use: Not Currently    Types: Marijuana    Comment: Last used: unknown   . Sexual activity: Not on file  Other Topics Concern  . Not on file  Social History Narrative    Patient lives at home with his mother.   Disabled.   Education 11 th grade .   Right handed.   Caffeine - one cup daily soda.   Social Determinants of Health   Financial Resource Strain:   . Difficulty of Paying Living Expenses: Not on file  Food Insecurity:   . Worried About Programme researcher, broadcasting/film/video in the Last Year: Not on file  . Ran Out of Food in the Last Year: Not on file  Transportation Needs:   . Lack of Transportation (Medical): Not on file  . Lack of Transportation (Non-Medical): Not on file  Physical Activity:   . Days of Exercise per Week: Not on file  . Minutes of Exercise per Session: Not on file  Stress:   . Feeling of Stress : Not on file  Social Connections:   . Frequency of Communication with Friends and Family: Not on file  . Frequency of Social Gatherings with Friends and Family: Not on file  . Attends Religious Services: Not on file  . Active Member of Clubs or Organizations: Not on file  . Attends Banker Meetings: Not on file  . Marital Status: Not on file    Allergies: No Known Allergies  Metabolic Disorder Labs: Lab Results  Component Value Date   HGBA1C 5.9 (H) 07/31/2017   MPG 122.63 07/31/2017   MPG 123 04/02/2015   No results found for: PROLACTIN Lab Results  Component Value Date   CHOL 113 01/29/2011   TRIG 52 01/29/2011   HDL 33 (L) 01/29/2011   CHOLHDL 3.4 01/29/2011   VLDL 10 01/29/2011   LDLCALC 70 01/29/2011   Lab Results  Component Value Date   TSH 0.848 01/18/2014   TSH 1.854 07/03/2011    Therapeutic Level Labs: Lab Results  Component Value Date   LITHIUM <0.06 (L) 01/25/2018   LITHIUM <0.06 (L) 04/20/2015   Lab Results  Component Value Date   VALPROATE <10 (L) 07/30/2017   VALPROATE 31 (L) 07/24/2017   No components found for:  CBMZ  Current Medications: Current Outpatient Medications  Medication Sig Dispense Refill  . amLODipine (NORVASC) 10 MG tablet Take 10 mg by mouth daily.    Marland Kitchen amoxicillin  (AMOXIL) 500 MG capsule Take 1 capsule (500 mg total) by mouth 3 (three) times daily. 21 capsule 0  . apixaban (ELIQUIS) 5 MG TABS tablet Take 5 mg by mouth 2 (two) times daily.    . benztropine (COGENTIN) 1 MG tablet Take 1 mg by mouth at bedtime.    . Calcium Citrate-Vitamin D (CALCIUM + D PO) Take 1 tablet by mouth daily.    Marland Kitchen docusate sodium (COLACE) 100 MG capsule  Take 200 mg by mouth at bedtime.    . ferrous gluconate (IRON 27) 240 (27 FE) MG tablet Take 240 mg by mouth daily.    Marland Kitchen ibuprofen (ADVIL) 200 MG tablet Take 400 mg by mouth every 6 (six) hours as needed for moderate pain.    Marland Kitchen latanoprost (XALATAN) 0.005 % ophthalmic solution Place 1 drop into both eyes at bedtime.    . Melatonin 5 MG TABS Take 10 mg by mouth at bedtime.     . metFORMIN (GLUCOPHAGE-XR) 500 MG 24 hr tablet Take 500 mg by mouth daily with breakfast.    . ocrelizumab (OCREVUS) 300 MG/10ML injection ADMINISTER 300MG  IV ON DAY 1, REPEAT 300MG  IV ON DAY 15, THEN 600MG  IV EVERY 6 MONTHS AFTER THAT 20 mL 1  . ocrelizumab in sodium chloride 0.9 % 500 mL CHANGE ::  Ocrevus 600mg  IV EVERY 8 (EIGHT) months. 1 each 2  . oxyCODONE-acetaminophen (PERCOCET) 5-325 MG tablet Take 1 tablet by mouth every 4 (four) hours as needed. 20 tablet 0  . paliperidone (INVEGA) 3 MG 24 hr tablet TAKE ONE TABLET BY MOUTH DAILY 30 tablet 0  . paliperidone (INVEGA) 6 MG 24 hr tablet Take 1 tablet (6 mg total) by mouth daily. 30 tablet 0  . paliperidone (INVEGA) 6 MG 24 hr tablet TAKE ONE TABLET BY MOUTH AT BEDTIME 30 tablet 0  . traZODone (DESYREL) 150 MG tablet Take 2 tablets at bedtime 60 tablet 1   Current Facility-Administered Medications  Medication Dose Route Frequency Provider Last Rate Last Admin  . diphenhydrAMINE (BENADRYL) injection 25 mg  25 mg Intravenous Once , MD      . methylPREDNISolone sodium succinate (SOLU-MEDROL) 100 mg in sodium chloride 0.9 % 50 mL IVPB  100 mg Intravenous Once , MD      .  ocrelizumab (OCREVUS) 600 mg in sodium chloride 0.9 % 250 mL  600 mg Intravenous Once [phone], MD          Psychiatric Specialty Exam: Review of Systems  There were no vitals taken for this visit.There is no height or weight on file to calculate BMI.  General Appearance: unable to assess due to phone visit   Eye Contact:  unable to assess due to phone visit  Speech:  Slow and monotonus  Volume:  Decreased  Mood:  Euthymic  Affect:  Restricted  Thought Process:  Goal Directed and Descriptions of Associations: Intact  Orientation:  Full (Time, Place, and Person)  Thought Content: Hallucinations: Auditory   Suicidal Thoughts:  No  Homicidal Thoughts:  No  Memory:  Immediate;   Good Recent;   Good  Judgement:  Fair  Insight:  Fair  Psychomotor Activity:  Decreased  Concentration:  Concentration: Good and Attention Span: Fair  Recall:  of Knowledge: Poor  Language: Good  Akathisia:  Negative  Handed:  Right  AIMS (if indicated): not done  Assets:  Communication Skills Desire for Improvement Financial Resources/Insurance Housing Social Support  ADL's:  Intact  Cognition: WNL  Sleep:  Poor     Assessment and Plan: Patient and his mother reported that he has ongoing difficulties with sleep and he also continues to have auditory hallucinations.  They were agreeable to discontinuing Invega and trying Abilify to target his psychotic symptoms.  They were also agreeable to discontinuing trazodone and switching to doxepin to help with sleep. Potential side effects of medication and risks vs benefits of treatment vs  non-treatment were explained and discussed. All questions were answered.  1. Disorganized schizophrenia (HCC)  - ARIPiprazole (ABILIFY) 5 MG tablet; Take 1 tablet (5 mg total) by mouth daily.  Dispense: 30 tablet; Refill: 0 - doxepin (SINEQUAN) 50 MG capsule; Take one to two capsules at bedtime as needed for sleep  Dispense: 60 capsule; Refill: 0  2.  Unspecified intellectual disabilities    F/up in 4 weeks for close monitoring.   Zena Amos, MD 03/22/2020, 2:55 PM

## 2020-04-02 ENCOUNTER — Other Ambulatory Visit (HOSPITAL_COMMUNITY): Payer: Self-pay | Admitting: Psychiatry

## 2020-04-02 DIAGNOSIS — F201 Disorganized schizophrenia: Secondary | ICD-10-CM

## 2020-04-03 ENCOUNTER — Encounter (HOSPITAL_COMMUNITY): Payer: Self-pay

## 2020-04-03 ENCOUNTER — Ambulatory Visit (HOSPITAL_COMMUNITY)
Admission: EM | Admit: 2020-04-03 | Discharge: 2020-04-04 | Disposition: A | Discharge status: Other Acute Inpatient Hospital | Payer: Medicare Other | Attending: Physician Assistant | Admitting: Physician Assistant

## 2020-04-03 ENCOUNTER — Other Ambulatory Visit: Payer: Self-pay

## 2020-04-03 DIAGNOSIS — R531 Weakness: Secondary | ICD-10-CM | POA: Diagnosis not present

## 2020-04-03 DIAGNOSIS — F909 Attention-deficit hyperactivity disorder, unspecified type: Secondary | ICD-10-CM | POA: Diagnosis not present

## 2020-04-03 DIAGNOSIS — Z8673 Personal history of transient ischemic attack (TIA), and cerebral infarction without residual deficits: Secondary | ICD-10-CM | POA: Insufficient documentation

## 2020-04-03 DIAGNOSIS — Z7984 Long term (current) use of oral hypoglycemic drugs: Secondary | ICD-10-CM | POA: Diagnosis not present

## 2020-04-03 DIAGNOSIS — Z20822 Contact with and (suspected) exposure to covid-19: Secondary | ICD-10-CM | POA: Insufficient documentation

## 2020-04-03 DIAGNOSIS — Z79899 Other long term (current) drug therapy: Secondary | ICD-10-CM | POA: Insufficient documentation

## 2020-04-03 DIAGNOSIS — R471 Dysarthria and anarthria: Secondary | ICD-10-CM | POA: Insufficient documentation

## 2020-04-03 DIAGNOSIS — F201 Disorganized schizophrenia: Secondary | ICD-10-CM | POA: Diagnosis present

## 2020-04-03 DIAGNOSIS — R454 Irritability and anger: Secondary | ICD-10-CM | POA: Insufficient documentation

## 2020-04-03 DIAGNOSIS — F319 Bipolar disorder, unspecified: Secondary | ICD-10-CM | POA: Insufficient documentation

## 2020-04-03 DIAGNOSIS — F17210 Nicotine dependence, cigarettes, uncomplicated: Secondary | ICD-10-CM | POA: Insufficient documentation

## 2020-04-03 DIAGNOSIS — D649 Anemia, unspecified: Secondary | ICD-10-CM | POA: Diagnosis not present

## 2020-04-03 DIAGNOSIS — Z7901 Long term (current) use of anticoagulants: Secondary | ICD-10-CM | POA: Insufficient documentation

## 2020-04-03 DIAGNOSIS — I2699 Other pulmonary embolism without acute cor pulmonale: Secondary | ICD-10-CM | POA: Diagnosis not present

## 2020-04-03 DIAGNOSIS — I10 Essential (primary) hypertension: Secondary | ICD-10-CM | POA: Insufficient documentation

## 2020-04-03 DIAGNOSIS — E118 Type 2 diabetes mellitus with unspecified complications: Secondary | ICD-10-CM | POA: Insufficient documentation

## 2020-04-03 DIAGNOSIS — Z760 Encounter for issue of repeat prescription: Secondary | ICD-10-CM | POA: Diagnosis not present

## 2020-04-03 DIAGNOSIS — G35 Multiple sclerosis: Secondary | ICD-10-CM | POA: Insufficient documentation

## 2020-04-03 LAB — POCT URINE DRUG SCREEN - MANUAL ENTRY (I-SCREEN)
POC Amphetamine UR: NOT DETECTED
POC Buprenorphine (BUP): NOT DETECTED
POC Cocaine UR: NOT DETECTED
POC Marijuana UR: NOT DETECTED
POC Methadone UR: NOT DETECTED
POC Methamphetamine UR: NOT DETECTED
POC Morphine: NOT DETECTED
POC Oxazepam (BZO): NOT DETECTED
POC Oxycodone UR: NOT DETECTED
POC Secobarbital (BAR): NOT DETECTED

## 2020-04-03 LAB — POC SARS CORONAVIRUS 2 AG -  ED: SARS Coronavirus 2 Ag: NEGATIVE

## 2020-04-03 MED ORDER — ACETAMINOPHEN 325 MG PO TABS: 650.0000 mg | ORAL_TABLET | Freq: Four times a day (QID) | ORAL | Status: DC | PRN

## 2020-04-03 MED ORDER — ARIPIPRAZOLE 5 MG PO TABS
5.0000 mg | ORAL_TABLET | Freq: Once | ORAL | Status: AC
  Administered 2020-04-04: 5 mg via ORAL
  Filled 2020-04-03: qty 1

## 2020-04-03 MED ORDER — ARIPIPRAZOLE 10 MG PO TABS
10.0000 mg | ORAL_TABLET | Freq: Every day | ORAL | Status: DC
  Administered 2020-04-04: 10 mg via ORAL
  Filled 2020-04-03: qty 1

## 2020-04-03 MED ORDER — DOXEPIN HCL 25 MG PO CAPS
50.0000 mg | ORAL_CAPSULE | Freq: Every evening | ORAL | Status: DC | PRN
  Administered 2020-04-04: 50 mg via ORAL
  Filled 2020-04-03: qty 2

## 2020-04-03 MED ORDER — MAGNESIUM HYDROXIDE 400 MG/5ML PO SUSP: 30.0000 mL | Freq: Every day | ORAL | Status: DC | PRN

## 2020-04-03 MED ORDER — ALUM & MAG HYDROXIDE-SIMETH 200-200-20 MG/5ML PO SUSP: 30.0000 mL | ORAL | Status: DC | PRN

## 2020-04-03 NOTE — ED Triage Notes (Signed)
Pt arrives via [employer]. Pt unable to answer question of why are you here except with nonsensical statements.

## 2020-04-03 NOTE — ED Notes (Signed)
Pt given sandwich, chips and coke.

## 2020-04-03 NOTE — BH Assessment (Signed)
Comprehensive Clinical Assessment (CCA) Note  04/03/2020 Brandon [name] [id number]   Brandon [name] reports to [facility] via [employer] to get refills of medication. Pt denies any SI, HI, or AVH.  Pts mother reported that pt has been out of his medication for a while and has been hitting himself; pt was brought in by [employer].  Pt is compliant and not displaying any violent tendencies.  Pt reports that he is eating well and sleeping erratically--wants his medication to help him sleep.  Mother confirms that pt had medications changed a week ago and still has not stabilized "the meds are not working at all--he his still hearing things and seeing things and not sleeping".    Pt wandering around [facility]--had to be redirected [name] to room several times.  Brandon Pedro, LCSW Outpatient Therapist/Triage Specialist   Disposition: Per Brandon Back, PA  Overnight observation and restart medication  Visit Diagnosis:   Disorganized schizophrenia   CCA Screening, Triage and Referral (STR)  Patient Reported Information How did you hear about [place]? Legal System  Referral name: [facility]  Referral phone number: [phone]   Whom do you see for routine medical problems? Primary Care  Practice/Facility Name: [facility]  Practice/Facility Phone Number: No data recorded Name of Contact: No data recorded Contact Number: No data recorded Contact Fax Number: No data recorded Prescriber Name: No data recorded Prescriber Address (if known): No data recorded  What Is the Reason for Your Visit/Call Today? refills of medication  How Long Has This Been Causing You Problems? 1 wk - 1 month  What Do You Feel Would Help You the Most Today? Assessment Only;Medication   Have You Recently Been in Any Inpatient Treatment (Hospital/Detox/Crisis Center/28-Day Program)? No  Name/Location of Program/Hospital:No data recorded How Long Were You There? No data recorded When Were You Discharged? No data  recorded  Have You Ever Received Services From [facility] Before? Yes  Who Do You See at [facility]? [facility]   Have You Recently Had Any Thoughts About Hurting Yourself? No  Are You Planning to Commit Suicide/Harm Yourself At This time? No   Have you Recently Had Thoughts About Hurting Someone Brandon [name]? No  Explanation: No data recorded  Have You Used Any Alcohol or Drugs in the Past 24 Hours? No  How Long Ago Did You Use Drugs or Alcohol? No data recorded What Did You Use and How Much? No data recorded  Do You Currently Have a Therapist/Psychiatrist? Yes  Name of Therapist/Psychiatrist: Dr. Evelene [name]   Have You Been Recently Discharged From Any Office Practice or Programs? No  Explanation of Discharge From Practice/Program: Graduated [employer] [facility]     CCA Screening Triage Referral Assessment Type of Contact: Face-to-Face  Is this Initial or Reassessment? No data recorded Date Telepsych consult ordered in [facility]:  No data recorded Time Telepsych consult ordered in [facility]:  No data recorded  Patient Reported Information Reviewed? Yes  Patient Left Without Being Seen? No data recorded Reason for Not Completing Assessment: No data recorded  Collateral Involvement: Mother   Does Patient Have a Court Appointed Legal Guardian? No data recorded Name and Contact of Legal Guardian: No data recorded If Minor and Not Living with Parent(s), Who has Custody? No data recorded Is CPS involved or ever been involved? Never  Is APS involved or ever been involved? Never   Patient Determined To Be At Risk for Harm To Self or Others Based on Review of Patient Reported Information or Presenting  Complaint? No  Method: No data recorded Availability of Means: No data recorded Intent: No data recorded Notification Required: No data recorded Additional Information for Danger to Others Potential: No data recorded Additional Comments for Danger to Others Potential: No data  recorded Are There Guns or Other Weapons in Your Home? No data recorded Types of Guns/Weapons: No data recorded Are These Weapons Safely Secured?                            No data recorded Who Could Verify You Are Able To Have These Secured: No data recorded Do You Have any Outstanding Charges, Pending Court Dates, Parole/Probation? No data recorded Contacted To Inform of Risk of Harm To Self or Others: No data recorded  Location of Assessment: [facility] Assessment Services   Does Patient Present under Involuntary Commitment? No  IVC Papers Initial File Date: No data recorded  [place] of Residence: [place]   Patient Currently Receiving the Following Services: Medication Management   Determination of Need: No data recorded  Options For Referral: Medication Management     CCA Biopsychosocial  Intake/Chief Complaint:   psychosis/medication refill request  Mental Health Symptoms Depression:     Mania:     Anxiety:      Psychosis:  Psychosis: Affective flattening/alogia/avolition, Grossly disorganized or catatonic behavior, Grossly disorganized speech, Duration of symptoms greater than six months  Trauma:     Obsessions:     Compulsions:     Inattention:     Hyperactivity/Impulsivity:     Oppositional/Defiant Behaviors:     Emotional Irregularity:     Other Mood/Personality Symptoms:      Mental Status Exam Appearance and self-care  Stature:  Stature: Average  Weight:  Weight: Overweight  Clothing:  Clothing: Neat/clean  Grooming:  Grooming: Normal  Cosmetic use:  Cosmetic Use: None  Posture/gait:  Posture/Gait: Tense  Motor activity:  Motor Activity: Agitated, Restless  Sensorium  Attention:  Attention: Confused  Concentration:  Concentration: Scattered  Orientation:  Orientation: Person, Place  Recall/memory:  Recall/Memory:  (uta)  Affect and Mood  Affect:  Affect: Labile  Mood:  Mood: Anxious  Relating  Eye contact:  Eye Contact: Fleeting  Facial  expression:  Facial Expression: Tense  Attitude toward examiner:  Attitude Toward Examiner: Cooperative  Thought and Language  Speech flow: Speech Flow: Garbled, Soft  Thought content:  Thought Content: Suspicious  Preoccupation:  Preoccupations: None  Hallucinations:  Hallucinations: Auditory, Visual  Organization:     Company secretary of Knowledge:  Fund of Knowledge:  Industrial/product designer)  Intelligence:  Intelligence: Needs investigation  Abstraction:  Abstraction:  Industrial/product designer)  Judgement:  Judgement: Impaired  Reality Testing:  Reality Testing: Unaware  Insight:  Insight: Lacking  Decision Making:  Decision Making: Confused, Only simple  Social Functioning  Social Maturity:  Social Maturity: Impulsive  Social Judgement:  Social Judgement: Naive  Stress  Stressors:     Coping Ability:  Coping Ability: Normal  Skill Deficits:  Skill Deficits: Self-control, Responsibility  Supports:  Supports: Family    Leisure/Recreation: Leisure / Recreation Do You Have Hobbies?: Yes  Exercise/Diet: Exercise/Diet Do You Exercise?: No Have You Gained or Lost A Significant Amount of Weight in the Past Six Months?: No Do You Follow a Special Diet?: No Do You Have Any Trouble Sleeping?: No   CCA Employment/Education  Employment/Work Situation: Employment / Work Situation Employment situation: On disability What is the longest time patient  has a held a job?: NA Where was the patient employed at that time?: NA Has patient ever been in the [employer]?: No  Education: Education Last Grade Completed: 12 Name of High School: NA Did Garment/textile technologist From [employer]?: Yes Did Theme park manager?:  (not assessed) Did You Attend Graduate School?:  (not assessed) Did You Have An Individualized Education Program (IIEP):  (not assessed) Did You Have Any Difficulty At School?:  (not assessed)   CCA Family/Childhood History  Family and Relationship History: Family history Are you sexually active?:  (not  assessed) What is your sexual orientation?: not assessed Has your sexual activity been affected by drugs, alcohol, medication, or emotional stress?: not assessed Does patient have children?: No  Childhood History:  Childhood History By whom was/is the patient raised?: Mother Additional childhood history information: NA Description of patient's relationship with caregiver when they were a child: NA How were you disciplined when you got in trouble as a child/adolescent?: NA Did patient suffer any verbal/emotional/physical/sexual abuse as a child?: No Has patient ever been sexually abused/assaulted/raped as an adolescent or adult?: No Witnessed domestic violence?: No Has patient been affected by domestic violence as an adult?: No   CCA Substance Use  Alcohol/Drug Use: Alcohol / Drug Use History of alcohol / drug use?: No history of alcohol / drug abuse    DSM5 Diagnoses: Patient Active Problem List   Diagnosis Date Noted  . Unspecified intellectual disabilities 01/27/2020  . Migraine headache 04/18/2019  . Generalized weakness   . Pulmonary embolism (HCC) 08/12/2017  . Hyperglycemia 07/31/2017  . Multiple sclerosis exacerbation (HCC) 07/30/2017  . Acute encephalopathy 07/30/2017  . Disorganized schizophrenia (HCC)   . Hallucinations 08/05/2014  . Auditory hallucination   . Left-sided weakness 01/18/2014  . Multiple sclerosis (HCC) 05/20/2013  . 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 Abnormality of gait 11/23/2012  . HTN (hypertension) 07/04/2011  . 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 Obesity, Class III, BMI 40-49.9 (morbid obesity) (HCC) 01/14/2010  . ADHD 01/14/2010    Referrals to Alternative Service(s):  Per Brandon Back, PA: overnight observation and restart medication  Tyhesha Dutson R Camelia Stelzner

## 2020-04-03 NOTE — ED Notes (Signed)
Patient belongings are stored in locker #31 

## 2020-04-04 ENCOUNTER — Emergency Department (HOSPITAL_COMMUNITY)
Admission: EM | Admit: 2020-04-04 | Discharge: 2020-04-05 | Disposition: A | Discharge status: Other Acute Inpatient Hospital | Payer: Medicare Other | Attending: Emergency Medicine | Admitting: Emergency Medicine

## 2020-04-04 ENCOUNTER — Encounter (HOSPITAL_COMMUNITY): Payer: Self-pay | Admitting: Emergency Medicine

## 2020-04-04 ENCOUNTER — Other Ambulatory Visit: Payer: Self-pay

## 2020-04-04 DIAGNOSIS — F200 Paranoid schizophrenia: Secondary | ICD-10-CM | POA: Insufficient documentation

## 2020-04-04 DIAGNOSIS — Z7984 Long term (current) use of oral hypoglycemic drugs: Secondary | ICD-10-CM | POA: Insufficient documentation

## 2020-04-04 DIAGNOSIS — F29 Unspecified psychosis not due to a substance or known physiological condition: Secondary | ICD-10-CM | POA: Diagnosis not present

## 2020-04-04 DIAGNOSIS — I10 Essential (primary) hypertension: Secondary | ICD-10-CM | POA: Insufficient documentation

## 2020-04-04 DIAGNOSIS — Z79899 Other long term (current) drug therapy: Secondary | ICD-10-CM | POA: Insufficient documentation

## 2020-04-04 DIAGNOSIS — Z7901 Long term (current) use of anticoagulants: Secondary | ICD-10-CM | POA: Insufficient documentation

## 2020-04-04 DIAGNOSIS — F17210 Nicotine dependence, cigarettes, uncomplicated: Secondary | ICD-10-CM | POA: Diagnosis not present

## 2020-04-04 LAB — CBC WITH DIFFERENTIAL/PLATELET
Abs Immature Granulocytes: 0.03 10*3/uL (ref 0.00–0.07)
Basophils Absolute: 0 10*3/uL (ref 0.0–0.1)
Basophils Relative: 0 %
Eosinophils Absolute: 0.3 10*3/uL (ref 0.0–0.5)
Eosinophils Relative: 3 %
HCT: 41.6 % (ref 39.0–52.0)
Hemoglobin: 13 g/dL (ref 13.0–17.0)
Immature Granulocytes: 0 %
Lymphocytes Relative: 28 %
Lymphs Abs: 2.1 10*3/uL (ref 0.7–4.0)
MCH: 26.1 pg (ref 26.0–34.0)
MCHC: 31.3 g/dL (ref 30.0–36.0)
MCV: 83.4 fL (ref 80.0–100.0)
Monocytes Absolute: 0.8 10*3/uL (ref 0.1–1.0)
Monocytes Relative: 10 %
Neutro Abs: 4.5 10*3/uL (ref 1.7–7.7)
Neutrophils Relative %: 59 %
Platelets: 173 10*3/uL (ref 150–400)
RBC: 4.99 MIL/uL (ref 4.22–5.81)
RDW: 14 % (ref 11.5–15.5)
WBC: 7.7 10*3/uL (ref 4.0–10.5)
nRBC: 0 % (ref 0.0–0.2)

## 2020-04-04 LAB — COMPREHENSIVE METABOLIC PANEL
ALT: 31 U/L (ref 0–44)
AST: 13 U/L — ABNORMAL LOW (ref 15–41)
Albumin: 4.1 g/dL (ref 3.5–5.0)
Alkaline Phosphatase: 79 U/L (ref 38–126)
Anion gap: 12 (ref 5–15)
BUN: 17 mg/dL (ref 6–20)
CO2: 26 mmol/L (ref 22–32)
Calcium: 9.4 mg/dL (ref 8.9–10.3)
Chloride: 103 mmol/L (ref 98–111)
Creatinine, Ser: 0.92 mg/dL (ref 0.61–1.24)
GFR calc Af Amer: >60 mL/min (ref >60)
GFR calc non Af Amer: >60 mL/min (ref >60)
Glucose, Bld: 111 mg/dL — ABNORMAL HIGH (ref 70–99)
Potassium: 4.1 mmol/L (ref 3.5–5.1)
Sodium: 141 mmol/L (ref 135–145)
Total Bilirubin: 0.6 mg/dL (ref 0.3–1.2)
Total Protein: 7.1 g/dL (ref 6.5–8.1)

## 2020-04-04 LAB — RAPID URINE DRUG SCREEN, HOSP PERFORMED
Amphetamines: NOT DETECTED
Barbiturates: NOT DETECTED
Benzodiazepines: NOT DETECTED
Cocaine: NOT DETECTED
Opiates: NOT DETECTED
Tetrahydrocannabinol: NOT DETECTED

## 2020-04-04 LAB — ETHANOL: Alcohol, Ethyl (B): <10 mg/dL (ref <10)

## 2020-04-04 LAB — RESPIRATORY PANEL BY RT PCR (FLU A&B, COVID)
Influenza A by PCR: NEGATIVE
Influenza B by PCR: NEGATIVE
SARS Coronavirus 2 by RT PCR: NEGATIVE

## 2020-04-04 MED ORDER — HALOPERIDOL LACTATE 5 MG/ML IJ SOLN
INTRAMUSCULAR | Status: AC
  Administered 2020-04-04: 10 mg via INTRAMUSCULAR
  Filled 2020-04-04: qty 2

## 2020-04-04 MED ORDER — AMLODIPINE BESYLATE 10 MG PO TABS
10.0000 mg | ORAL_TABLET | Freq: Every day | ORAL | Status: DC
  Administered 2020-04-04: 10 mg via ORAL
  Filled 2020-04-04: qty 1

## 2020-04-04 MED ORDER — HYDROXYZINE HCL 25 MG PO TABS: 25.0000 mg | ORAL_TABLET | Freq: Three times a day (TID) | ORAL | Status: DC | PRN

## 2020-04-04 MED ORDER — DOCUSATE SODIUM 100 MG PO CAPS: 400.0000 mg | ORAL_CAPSULE | Freq: Every day | ORAL | Status: DC

## 2020-04-04 MED ORDER — HALOPERIDOL LACTATE 5 MG/ML IJ SOLN: 10.0000 mg | Freq: Once | INTRAMUSCULAR | Status: AC

## 2020-04-04 MED ORDER — QUETIAPINE FUMARATE 100 MG PO TABS
100.0000 mg | ORAL_TABLET | Freq: Once | ORAL | Status: AC
  Administered 2020-04-04: 100 mg via ORAL
  Filled 2020-04-04: qty 1

## 2020-04-04 MED ORDER — ZIPRASIDONE MESYLATE 20 MG IM SOLR
20.0000 mg | INTRAMUSCULAR | Status: DC | PRN
  Filled 2020-04-04: qty 20

## 2020-04-04 MED ORDER — LATANOPROST 0.005 % OP SOLN
1.0000 [drp] | Freq: Every day | OPHTHALMIC | Status: DC
  Filled 2020-04-04: qty 2.5

## 2020-04-04 MED ORDER — LORAZEPAM 2 MG/ML IJ SOLN: 2.0000 mg | Freq: Once | INTRAMUSCULAR | Status: AC

## 2020-04-04 MED ORDER — APIXABAN 5 MG PO TABS
5.0000 mg | ORAL_TABLET | Freq: Two times a day (BID) | ORAL | Status: DC
  Administered 2020-04-04: 5 mg via ORAL
  Filled 2020-04-04: qty 1

## 2020-04-04 MED ORDER — ZIPRASIDONE MESYLATE 20 MG IM SOLR: 20.0000 mg | INTRAMUSCULAR | Status: DC | PRN

## 2020-04-04 MED ORDER — LORAZEPAM 1 MG PO TABS: 1.0000 mg | ORAL_TABLET | ORAL | 0 refills | Status: DC | PRN

## 2020-04-04 MED ORDER — DIPHENHYDRAMINE HCL 50 MG/ML IJ SOLN: 50.0000 mg | Freq: Once | INTRAMUSCULAR | Status: AC

## 2020-04-04 MED ORDER — ADULT MULTIVITAMIN W/MINERALS CH
1.0000 | ORAL_TABLET | Freq: Every day | ORAL | Status: DC
  Administered 2020-04-04: 1 via ORAL
  Filled 2020-04-04: qty 1

## 2020-04-04 MED ORDER — DIPHENHYDRAMINE HCL 50 MG/ML IJ SOLN
INTRAMUSCULAR | Status: AC
  Administered 2020-04-04: 50 mg via INTRAMUSCULAR
  Filled 2020-04-04: qty 1

## 2020-04-04 MED ORDER — CALCIUM CARBONATE-VITAMIN D 500-200 MG-UNIT PO TABS
1.0000 | ORAL_TABLET | Freq: Every day | ORAL | Status: DC
  Administered 2020-04-04: 1 via ORAL
  Filled 2020-04-04: qty 1

## 2020-04-04 MED ORDER — LORAZEPAM 2 MG/ML IJ SOLN
INTRAMUSCULAR | Status: AC
  Administered 2020-04-04: 2 mg via INTRAMUSCULAR
  Filled 2020-04-04: qty 1

## 2020-04-04 MED ORDER — QUETIAPINE FUMARATE 200 MG PO TABS: 400.0000 mg | ORAL_TABLET | Freq: Every day | ORAL | Status: DC

## 2020-04-04 MED ORDER — FERROUS GLUCONATE 324 (38 FE) MG PO TABS
324.0000 mg | ORAL_TABLET | Freq: Every day | ORAL | Status: DC
  Administered 2020-04-04: 324 mg via ORAL
  Filled 2020-04-04 (×2): qty 1

## 2020-04-04 MED ORDER — OLANZAPINE 10 MG PO TBDP
10.0000 mg | ORAL_TABLET | Freq: Three times a day (TID) | ORAL | Status: DC | PRN
  Administered 2020-04-04: 10 mg via ORAL
  Filled 2020-04-04: qty 1

## 2020-04-04 MED ORDER — LORAZEPAM 1 MG PO TABS: 1.0000 mg | ORAL_TABLET | ORAL | Status: DC | PRN

## 2020-04-04 MED ORDER — HYDROXYZINE HCL 25 MG PO TABS: 25.0000 mg | ORAL_TABLET | Freq: Three times a day (TID) | ORAL | 0 refills | Status: DC | PRN

## 2020-04-04 MED ORDER — QUETIAPINE FUMARATE 50 MG PO TABS: 50.0000 mg | ORAL_TABLET | Freq: Two times a day (BID) | ORAL | Status: DC | PRN

## 2020-04-04 MED ORDER — METFORMIN HCL ER 500 MG PO TB24: 500.0000 mg | ORAL_TABLET | Freq: Every day | ORAL | Status: DC

## 2020-04-04 MED ORDER — QUETIAPINE FUMARATE 400 MG PO TABS: 400.0000 mg | ORAL_TABLET | Freq: Every day | ORAL | Status: DC

## 2020-04-04 MED ORDER — OLANZAPINE 10 MG PO TBDP: 10.0000 mg | ORAL_TABLET | Freq: Three times a day (TID) | ORAL | Status: DC | PRN

## 2020-04-04 NOTE — ED Notes (Signed)
Patient is so loud and pacing in the Flex area

## 2020-04-04 NOTE — ED Notes (Signed)
Patient continues to need redirecting, Patient continues to have outburst.

## 2020-04-04 NOTE — Progress Notes (Addendum)
Patient became aggressive toward property and another patient, demanding to leave. Patient remains psychotic. IM medications ordered. IVC initiated. Mother called and updated.

## 2020-04-04 NOTE — Discharge Instructions (Addendum)
Patient under IVC. Transfer to [facility] ED.  Accepted to [facility] 04/05/20 for inpatient treatment.

## 2020-04-04 NOTE — ED Notes (Signed)
Patient intensifies screaming, laughing out loud. Other patients are awake.Brandon [name] is transferred from adult bed #03 to Flex bed #02 to be alone in the Flex room.

## 2020-04-04 NOTE — ED Notes (Addendum)
Marciano Sequin NP made aware of patient blood pressure was 147/116-76-20. Patient administered Norvasc as ordered and vital rechecked and blood presure  125/100-80-20. No new orders received. Monitoring of patient continues.

## 2020-04-04 NOTE — ED Notes (Signed)
While appearing to be sleeping pt with loud outbursts of laughing, talking & cursing. Pt moved to flex area to reduce disturbance to other patients. Pt calm & cooperative with move, lay down and again appeared to sleep with continued outbursts. NAD observed.

## 2020-04-04 NOTE — ED Notes (Signed)
Report given to Darl Pikes, RN at [facility] prior to [facility] transporting pt to facility due to being IVC'ed. [employer] escorted to pt [facility] without difficulty. Safety maintained

## 2020-04-04 NOTE — ED Notes (Signed)
Patient is talking to himself and laughter out very loud.

## 2020-04-04 NOTE — ED Notes (Signed)
PT HAS 2 PATIENT BELONGING BAGS LOCATED IN LOCKER 31

## 2020-04-04 NOTE — BH Assessment (Addendum)
Per Delaney Meigs, patient accepted to [facility]main campus[facility] for acceptance 04/05/2020 (anytime after 8am). Accepting psychiatrist is Dr. Estill Cotta. Nurse report [phone].Olin Hauser, RN and Sharion Dove, RN updated.

## 2020-04-04 NOTE — ED Notes (Signed)
Pt up to desk asking to use bathroom, upon direction RN observed urine puddle on floor. Pt provided clean pants & MHTs assisted in cleaning up.

## 2020-04-04 NOTE — ED Notes (Signed)
Attempted to obtain EKG pt remains obtunded from previous medication he received at [facility] unable to obtain EKG

## 2020-04-04 NOTE — ED Notes (Signed)
Patient became aggressive, difficulty redirecting.  Patient exhibited aggression towards staff. Patient received IM, Patient calm meal given.

## 2020-04-04 NOTE — ED Notes (Signed)
Patient is manic having outburst. Patient is redirectable. Patient continues to be aggressive towards self.

## 2020-04-04 NOTE — ED Provider Notes (Signed)
[facility]-EMERGENCY DEPT Provider Note   CSN: [id number] Arrival date & time: 04/04/20  1750     History Chief Complaint  Patient presents with  . Medical Clearance    Brandon [name] is a 29 y.o. male.  HPI      29 year old male with history of bipolar, ADHD, MS, pulmonary embolus, schizophrenia and, CVA who presents from [facility] under IVC when he presented with psychosis and became agitated.  According to patient's mother, he had been an active state of psychosis for the past 2 to 3 weeks.  His medications have not changed a week prior but the current management was not very effective.  Per mother, he had been having many thoughts and voices that were intensifying.  Mother had stated that he is talking to himself and screaming at people were not present.  She also reported that he repeatedly hit his hand on his head to rid himself of the voices.  He had initially presented voluntarily to be hot, however became aggressive towards property and another patient and was demanding to leave.  He remained psychotic, was placed under IVC.  He was given Haldol, Benadryl and Ativan at behavioral health urgent care prior to being transferred to the emergency department.  Due to receiving these medications, I am unable to obtain any history from the patient.  He is sleeping and sedated.  There are no reports of medical concerns from notes from behavioral health urgent care.  History was obtained from records on file an IVC placed.    Past Medical History:  Diagnosis Date  . ADHD (attention deficit hyperactivity disorder)   . Bipolar 1 disorder (HCC)   . Chronic back pain   . Chronic constipation   . Chronic neck pain   . Hypertension   . Multiple sclerosis (HCC) 05/20/2013   left sided weakness, dysarthria  . Non-compliance   . Obesity   . Pulmonary embolism (HCC)   . Schizophrenia (HCC)   . Stroke [facility])    left sided deficits - pt's mother denies this    Patient  Active Problem List   Diagnosis Date Noted  . Unspecified intellectual disabilities 01/27/2020  . Migraine headache 04/18/2019  . Generalized weakness   . Pulmonary embolism (HCC) 08/12/2017  . Hyperglycemia 07/31/2017  . Multiple sclerosis exacerbation (HCC) 07/30/2017  . Acute encephalopathy 07/30/2017  . Disorganized schizophrenia (HCC)   . Hallucinations 08/05/2014  . Auditory hallucination   . Left-sided weakness 01/18/2014  . Multiple sclerosis (HCC) 05/20/2013  . 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 Abnormality of gait 11/23/2012  . HTN (hypertension) 07/04/2011  . 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 Obesity, Class III, BMI 40-49.9 (morbid obesity) (HCC) 01/14/2010  . ADHD 01/14/2010    Past Surgical History:  Procedure Laterality Date  . NO PAST SURGERIES    . None    . TOOTH EXTRACTION N/A 06/24/2019   Procedure: DENTAL RESTORATION/EXTRACTION OF TEETH NUMBER ONE, SIXTEEN, SEVENTEEN, NINETEEN, THIRTY-TWO;  Surgeon: Ocie Doyne, DDS;  Location: [facility] OR;  Service: Oral Surgery;  Laterality: N/A;       Family History  Problem Relation Age of Onset  . Diabetes Mother   . ADD / ADHD Brother     Social History   Tobacco Use  . Smoking status: Current Every Day Smoker    Packs/day: 0.25    Types: Cigarettes  . Smokeless tobacco: Never Used  . Tobacco comment: 3 cigarettes a day  Vaping Use  .  Vaping Use: Never used  Substance Use Topics  . Alcohol use: Not Currently    Alcohol/week: 0.0 standard drinks    Comment: "A little bit"   . Drug use: Not Currently    Types: Marijuana    Comment: Last used: unknown     Home Medications Prior to Admission medications   Medication Sig Start Date End Date Taking? Authorizing Provider  amLODipine (NORVASC) 10 MG tablet Take 10 mg by mouth daily.    [provider]  apixaban (ELIQUIS) 5 MG TABS tablet Take 5 mg by mouth 2 (two) times daily.     [provider]  Calcium Citrate-Vitamin D (CALCIUM +  D PO) Take 1 tablet by mouth daily.    [provider]  docusate sodium (COLACE) 100 MG capsule Take 400 mg by mouth at bedtime.     [provider]  doxepin (SINEQUAN) 50 MG capsule Take one to two capsules at bedtime as needed for sleep Patient taking differently: Take 50-100 mg by mouth at bedtime. Take one to two capsules at bedtime as needed for sleep 03/22/20   Zena Amos, MD  ferrous gluconate (IRON 27) 240 (27 FE) MG tablet Take 240 mg by mouth daily.    [provider]  hydrOXYzine (ATARAX/VISTARIL) 25 MG tablet Take 1 tablet (25 mg total) by mouth 3 (three) times daily as needed for anxiety. 04/04/20   Aldean Baker, NP  latanoprost (XALATAN) 0.005 % ophthalmic solution Place 1 drop into both eyes at bedtime.    [provider]  LORazepam (ATIVAN) 1 MG tablet Take 1 tablet (1 mg total) by mouth as needed for anxiety (severe agitation). 04/04/20   Aldean Baker, NP  metFORMIN (GLUCOPHAGE-XR) 500 MG 24 hr tablet Take 500 mg by mouth daily with breakfast.    [provider]  Multiple Vitamin (MULTIVITAMIN WITH MINERALS) TABS tablet Take 1 tablet by mouth daily.    [provider]  ocrelizumab in sodium chloride 0.9 % 500 mL CHANGE ::  Ocrevus 600mg  IV EVERY 8 (EIGHT) months. 11/10/19   01/10/20, NP  OLANZapine zydis (ZYPREXA) 10 MG disintegrating tablet Take 1 tablet (10 mg total) by mouth every 8 (eight) hours as needed (agitation). 04/04/20   04/06/20, NP  QUEtiapine (SEROQUEL) 400 MG tablet Take 1 tablet (400 mg total) by mouth at bedtime. 04/04/20   04/06/20, NP  ziprasidone (GEODON) injection Inject 20 mg into the muscle as needed for agitation. 04/04/20   04/06/20, NP  propranolol (INDERAL) 10 MG tablet Take 1 tablet (10 mg total) by mouth 2 (two) times daily. 05/03/15 01/03/20  01/05/20, NP    Allergies    Patient has no known allergies.  Review of Systems   Review of Systems  Unable to perform ROS:  Mental status change    Physical Exam Updated Vital Signs BP (!) 100/42 (BP Location: Left Arm)   Pulse 81   Temp 98.2 F (36.8 C) (Oral)   Resp 20   SpO2 100%   Physical Exam Constitutional:      General: He is not in acute distress.    Appearance: He is obese. He is not ill-appearing, toxic-appearing or diaphoretic.     Comments: Sleeping comfortably  HENT:     Head: Normocephalic and atraumatic.  Cardiovascular:     Rate and Rhythm: Normal rate.     Pulses: Normal pulses.  Pulmonary:     Effort: Pulmonary  effort is normal. No respiratory distress.     Breath sounds: Normal breath sounds. No stridor. No wheezing, rhonchi or rales.  Chest:     Chest wall: No tenderness.  Musculoskeletal:        General: No deformity.  Skin:    General: Skin is warm and dry.     ED Results / Procedures / Treatments   Labs (all labs ordered are listed, but only abnormal results are displayed) Labs Reviewed  COMPREHENSIVE METABOLIC PANEL - Abnormal; Notable for the following components:      Result Value   Glucose, Bld 111 (*)    AST 13 (*)    All other components within normal limits  ETHANOL  RAPID URINE DRUG SCREEN, HOSP PERFORMED  CBC WITH DIFFERENTIAL/PLATELET    EKG EKG Interpretation  Date/Time:  Wednesday April 04 2020 22:28:43 EDT Ventricular Rate:  77 PR Interval:  126 QRS Duration: 90 QT Interval:  366 QTC Calculation: 414 R Axis:   34 Text Interpretation: Normal sinus rhythm Normal ECG No significant change since last tracing Confirmed by Alvira Monday ([id number]) on 04/04/2020 11:34:36 PM   Radiology No results found.  Procedures Procedures (including critical care time)  Medications Ordered in ED Medications - No data to display  ED Course  I have reviewed the triage vital signs and the nursing notes.  Pertinent labs & imaging results that were available during my care of the patient were reviewed by me and considered in my medical decision making  (see chart for details).    MDM Rules/Calculators/A&P                          29 year old male with history of bipolar, ADHD, MS, pulmonary embolus, schizophrenia and, CVA who presents from [facility] under IVC when he presented with psychosis and became agitated.  History is limited on his arrival to the emergency department as he had received Haldol, Ativan, and Benadryl prior to arrival.  Reviewed records from behavioral health urgent care.  There are no reports of medical concern as he presented with psychosis and then became agitated and aggressive.   CMP, CBC shows no acute concerns.  When he wakes after receiving the sedating medications he may be medically cleared. He has been accepted to [facility]. He has a COVID test from yesterday that is negative.       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

## 2020-04-04 NOTE — ED Notes (Signed)
Patient mom called and requested patient left ear be looked at by doctor. [employer] notified of mom request.

## 2020-04-04 NOTE — BH Assessment (Addendum)
Per Marciano Sequin, NP, patient meets inpatient criteria. Will need inpatient placement. No [facility] appropriate [facility] beds available. Disposition Counselor referred patient to the following facilities for bed placement.  [facility]  [facility]  [facility]  [facility]  [facility]  [facility]  [facility]  [facility]  [facility]  [facility]

## 2020-04-04 NOTE — ED Notes (Signed)
Patient redirected to sit and try and watch television. Patient is calm at this time and watching television.

## 2020-04-04 NOTE — ED Notes (Signed)
Pt has assigned bed per note entered by Melynda Ripple 04/04/20. Pt can be transported to [facility], Sept 30, 2021 after 8am. Dr. Liana Gerold is accepting pt. Report can be called to [phone]. Pt has legal guardian (mother) Richerd Grime [phone].

## 2020-04-04 NOTE — ED Provider Notes (Addendum)
Behavioral Health Progress Note  Date and Time: 04/04/2020 8:57 AM Name: Brandon [name] MRN:  [id number]  Subjective:  Brandon [name] is a 29 year old male with history of MS, IDD, and schizophrenia who presented voluntarily as a walk-in last night due to auditory hallucinations. On assessment this morning, patient appears internally preoccupied, guarded, paranoid, and is a limited historian. He provides minimal answers on assessment. He is oriented x3. He states that he presented to the [facility] last night because he was "tired of hearing the voices." He denies SI/HI. He has not slept and had several outbursts with yelling, responding to internal stimuli while in observation.  Collateral information from legal guardian/mother Brandon [name] [phone]: Brandon [name] reports the patient has been hearing voices for years but that Brandon [name] appear to have intensified over the last two weeks. He requested to go to the hospital last night. She states he is so internally preoccupied that she is unable to have a conversation with him. He has not been sleeping at night and has been pacing through their apartment instead. Brandon [name] reports she has been staying up all night to make sure he does not leave the apartment since the patient has been restless and not sleeping. She states patient has punched himself in the face and given himself a nosebleed related to frustration with AH. She denies any history of aggressive behaviors toward others.  Per chart review, patient seen by Brandon [name] two weeks ago. Invega and trazodone were discontinued at that time due to reports of continued AH and insomnia. Abilify and doxepin were started. Brandon [name] confirms that she gives patients his medications daily as prescribed. Brandon [name] states patient has tried multiple antipsychotics but believes he did well when prescribed Seroquel years ago. Per chart review he was on Zyprexa in the spring but discontinued due to weight gain. He is on Ocrevus  for MS every 8 months, last received 10/2019. Brandon [name] reports patient compliant with other home medications, which I have restarted this morning.    Diagnosis:  Final diagnoses:  Disorganized schizophrenia ([place])    Total Time spent with patient: 45 minutes  Past Psychiatric History: History of schizophrenia. Past Medical History:  Past Medical History:  Diagnosis Date  . ADHD (attention deficit hyperactivity disorder)   . Bipolar 1 disorder ([place])   . Chronic back pain   . Chronic constipation   . Chronic neck pain   . Hypertension   . Multiple sclerosis ([place]) 05/20/2013   left sided weakness, dysarthria  . Non-compliance   . Obesity   . Pulmonary embolism ([place])   . Schizophrenia ([place])   . Stroke [facility])    left sided deficits - pt's mother denies this    Past Surgical History:  Procedure Laterality Date  . NO PAST SURGERIES    . None    . TOOTH EXTRACTION N/A 06/24/2019   Procedure: DENTAL RESTORATION/EXTRACTION OF TEETH NUMBER ONE, SIXTEEN, SEVENTEEN, NINETEEN, THIRTY-TWO;  Surgeon: Diona Browner, DDS;  Location: [facility];  Service: Oral Surgery;  Laterality: N/A;   Family History:  Family History  Problem Relation Age of Onset  . Diabetes Mother   . ADD / ADHD Brother    Family Psychiatric  History: Brother has ADHD Social History:  Social History   Substance and Sexual Activity  Alcohol Use Not Currently  . Alcohol/week: 0.0 standard drinks   Comment: "A little bit"      Social History   Substance and Sexual Activity  Drug Use  Not Currently  . Types: Marijuana   Comment: Last used: unknown     Social History   Socioeconomic History  . Marital status: Single    Spouse name: Not on file  . Number of children: 0  . Years of education: 11th  . Highest education level: Not on file  Occupational History    Employer: [employer]    Comment: Disbaled  Tobacco Use  . Smoking status: Current Every Day Smoker    Packs/day: 0.25    Types: Cigarettes  .  Smokeless tobacco: Never Used  . Tobacco comment: 3 cigarettes a day  Vaping Use  . Vaping Use: Never used  Substance and Sexual Activity  . Alcohol use: Not Currently    Alcohol/week: 0.0 standard drinks    Comment: "A little bit"   . Drug use: Not Currently    Types: Marijuana    Comment: Last used: unknown   . Sexual activity: Not on file  Other Topics Concern  . Not on file  Social History Narrative   Patient lives at home with his mother.   Disabled.   Education 11 th grade .   Right handed.   Caffeine - one cup daily soda.   Social Determinants of Health   Financial Resource Strain:   . Difficulty of Paying Living Expenses: Not on file  Food Insecurity:   . Worried About Charity fundraiser in the Last Year: Not on file  . Ran Out of Food in the Last Year: Not on file  Transportation Needs:   . Lack of Transportation (Medical): Not on file  . Lack of Transportation (Non-Medical): Not on file  Physical Activity:   . Days of Exercise per Week: Not on file  . Minutes of Exercise per Session: Not on file  Stress:   . Feeling of Stress : Not on file  Social Connections:   . Frequency of Communication with Friends and Family: Not on file  . Frequency of Social Gatherings with Friends and Family: Not on file  . Attends Religious Services: Not on file  . Active Member of Clubs or Organizations: Not on file  . Attends Archivist Meetings: Not on file  . Marital Status: Not on file   SDOH:  SDOH Screenings   Alcohol Screen:   . Last Alcohol Screening Score (AUDIT): Not on file  Depression (PHQ2-9):   . PHQ-2 Score: Not on file  Financial Resource Strain:   . Difficulty of Paying Living Expenses: Not on file  Food Insecurity:   . Worried About Charity fundraiser in the Last Year: Not on file  . Ran Out of Food in the Last Year: Not on file  Housing:   . Last Housing Risk Score: Not on file  Physical Activity:   . Days of Exercise per Week: Not on file   . Minutes of Exercise per Session: Not on file  Social Connections:   . Frequency of Communication with Friends and Family: Not on file  . Frequency of Social Gatherings with Friends and Family: Not on file  . Attends Religious Services: Not on file  . Active Member of Clubs or Organizations: Not on file  . Attends Archivist Meetings: Not on file  . Marital Status: Not on file  Stress:   . Feeling of Stress : Not on file  Tobacco Use: High Risk  . Smoking Tobacco Use: Current Every Day Smoker  . Smokeless Tobacco Use:  Never Used  Transportation Needs:   . Film/video editor (Medical): Not on file  . Lack of Transportation (Non-Medical): Not on file   Additional Social History:    History of alcohol / drug use?: No history of alcohol / drug abuse                    Sleep: Poor  Appetite:  Good  Current Medications:  Current Facility-Administered Medications  Medication Dose Route Frequency Provider Last Rate Last Admin  . acetaminophen (TYLENOL) tablet 650 mg  650 mg Oral Q6H PRN Nwoko, Uchenna E, PA      . alum & mag hydroxide-simeth (MAALOX/MYLANTA) 200-200-20 MG/5ML suspension 30 mL  30 mL Oral Q4H PRN Nwoko, Uchenna E, PA      . diphenhydrAMINE (BENADRYL) injection 25 mg  25 mg Intravenous Once Kathrynn Ducking, MD      . doxepin (SINEQUAN) capsule 50 mg  50 mg Oral QHS PRN Nwoko, Uchenna E, PA   50 mg at 04/04/20 0038  . hydrOXYzine (ATARAX/VISTARIL) tablet 25 mg  25 mg Oral TID PRN Connye Burkitt, NP      . magnesium hydroxide (MILK OF MAGNESIA) suspension 30 mL  30 mL Oral Daily PRN Nwoko, Uchenna E, PA      . methylPREDNISolone sodium succinate (SOLU-MEDROL) 100 mg in sodium chloride 0.9 % 50 mL IVPB  100 mg Intravenous Once Kathrynn Ducking, MD      . ocrelizumab (OCREVUS) 600 mg in sodium chloride 0.9 % 250 mL  600 mg Intravenous Once Kathrynn Ducking, MD      . QUEtiapine (SEROQUEL) tablet 400 mg  400 mg Oral QHS Connye Burkitt, NP      .  QUEtiapine (SEROQUEL) tablet 50 mg  50 mg Oral BID PRN Connye Burkitt, NP       Current Outpatient Medications  Medication Sig Dispense Refill  . metFORMIN (GLUCOPHAGE-XR) 500 MG 24 hr tablet Take 1 tablet by mouth daily with breakfast.    . amLODipine (NORVASC) 10 MG tablet Take 10 mg by mouth daily.    Marland Kitchen amoxicillin (AMOXIL) 500 MG capsule Take 1 capsule (500 mg total) by mouth 3 (three) times daily. 21 capsule 0  . apixaban (ELIQUIS) 5 MG TABS tablet Take 5 mg by mouth 2 (two) times daily.    . ARIPiprazole (ABILIFY) 5 MG tablet Take 1 tablet (5 mg total) by mouth daily. 30 tablet 0  . benztropine (COGENTIN) 1 MG tablet Take 1 mg by mouth at bedtime.    . Calcium Citrate-Vitamin D (CALCIUM + D PO) Take 1 tablet by mouth daily.    Marland Kitchen docusate sodium (COLACE) 100 MG capsule Take 200 mg by mouth at bedtime.    Marland Kitchen doxepin (SINEQUAN) 50 MG capsule Take one to two capsules at bedtime as needed for sleep 60 capsule 0  . ferrous gluconate (IRON 27) 240 (27 FE) MG tablet Take 240 mg by mouth daily.    Marland Kitchen ibuprofen (ADVIL) 200 MG tablet Take 400 mg by mouth every 6 (six) hours as needed for moderate pain.    Marland Kitchen latanoprost (XALATAN) 0.005 % ophthalmic solution Place 1 drop into both eyes at bedtime.    . Melatonin 5 MG TABS Take 10 mg by mouth at bedtime.     . metFORMIN (GLUCOPHAGE-XR) 500 MG 24 hr tablet Take 500 mg by mouth daily with breakfast.    . ocrelizumab (OCREVUS) 300 MG/10ML injection ADMINISTER 300MG IV  ON DAY 1, REPEAT 300MG IV ON DAY 15, THEN 600MG IV EVERY 6 MONTHS AFTER THAT 20 mL 1  . ocrelizumab in sodium chloride 0.9 % 500 mL CHANGE ::  Ocrevus 666m IV EVERY 8 (EIGHT) months. 1 each 2  . oxyCODONE-acetaminophen (PERCOCET) 5-325 MG tablet Take 1 tablet by mouth every 4 (four) hours as needed. 20 tablet 0  . paliperidone (INVEGA) 3 MG 24 hr tablet TAKE ONE TABLET BY MOUTH DAILY 30 tablet 0  . paliperidone (INVEGA) 6 MG 24 hr tablet Take 6 mg by mouth at bedtime.    . topiramate  (TOPAMAX) 25 MG tablet Take 25 mg by mouth 2 (two) times daily.    . traZODone (DESYREL) 150 MG tablet Take 150-300 mg by mouth at bedtime as needed.      Labs  Lab Results:  Admission on 04/03/2020  Component Date Value Ref Range Status  . SARS Coronavirus 2 by RT PCR 04/03/2020 NEGATIVE  NEGATIVE Final   Comment: (NOTE) SARS-CoV-2 target nucleic acids are NOT DETECTED.  The SARS-CoV-2 RNA is generally detectable in upper respiratoy specimens during the acute phase of infection. The lowest concentration of SARS-CoV-2 viral copies this assay can detect is 131 copies/mL. A negative result does not preclude SARS-Cov-2 infection and should not be used as the sole basis for treatment or other patient management decisions. A negative result may occur with  improper specimen collection/handling, submission of specimen other than nasopharyngeal swab, presence of viral mutation(s) within the areas targeted by this assay, and inadequate number of viral copies (<131 copies/mL). A negative result must be combined with clinical observations, patient history, and epidemiological information. The expected result is Negative.  Fact Sheet for Patients:  hPinkCheek.be Fact Sheet for Healthcare Providers:  hGravelBags.it This test is no                          t yet approved or cleared by the [place] and  has been authorized for detection and/or diagnosis of SARS-CoV-2 by FDA under an Emergency Use Authorization (EUA). This EUA will remain  in effect (meaning this test can be used) for the duration of the COVID-19 declaration under Section 564(b)(1) of the Act, 21 U.S.C. section 360bbb-3(b)(1), unless the authorization is terminated or revoked sooner.    . Influenza A by PCR 04/03/2020 NEGATIVE  NEGATIVE Final  . Influenza B by PCR 04/03/2020 NEGATIVE  NEGATIVE Final   Comment: (NOTE) The Xpert Xpress SARS-CoV-2/FLU/RSV  assay is intended as an aid in  the diagnosis of influenza from Nasopharyngeal swab specimens and  should not be used as a sole basis for treatment. Nasal washings and  aspirates are unacceptable for Xpert Xpress SARS-CoV-2/FLU/RSV  testing.  Fact Sheet for Patients: hPinkCheek.be Fact Sheet for Healthcare Providers: hGravelBags.it This test is not yet approved or cleared by the [place] and  has been authorized for detection and/or diagnosis of SARS-CoV-2 by  FDA under an Emergency Use Authorization (EUA). This EUA will remain  in effect (meaning this test can be used) for the duration of the  Covid-19 declaration under Section 564(b)(1) of the Act, 21  U.S.C. section 360bbb-3(b)(1), unless the authorization is  terminated or revoked. Performed at [facility] [id number], [place] [address]  . POC Amphetamine UR 04/03/2020 None Detected  None Detected Final  . POC Secobarbital (BAR) 04/03/2020 None Detected  None Detected Final  .  POC Buprenorphine (BUP) 04/03/2020 None Detected  None Detected Final  . POC Oxazepam (BZO) 04/03/2020 None Detected  None Detected Final  . POC Cocaine UR 04/03/2020 None Detected  None Detected Final  . POC Methamphetamine UR 04/03/2020 None Detected  None Detected Final  . POC Morphine 04/03/2020 None Detected  None Detected Final  . POC Oxycodone UR 04/03/2020 None Detected  None Detected Final  . POC Methadone UR 04/03/2020 None Detected  None Detected Final  . POC Marijuana UR 04/03/2020 None Detected  None Detected Final  . SARS Coronavirus 2 Ag 04/03/2020 Negative  Negative Preliminary  Office Visit on 11/03/2019  Component Date Value Ref Range Status  . WBC 11/03/2019 7.0  3.4 - 10.8 x10E3/uL Final  . RBC 11/03/2019 4.76  4.14 - 5.80 x10E6/uL Final  . Hemoglobin 11/03/2019 12.7* 13.0 - 17.7 g/dL Final  . Hematocrit 11/03/2019 39.4  37.5 - 51.0 % Final  .  MCV 11/03/2019 83  79 - 97 fL Final  . MCH 11/03/2019 26.7  26.6 - 33.0 pg Final  . MCHC 11/03/2019 32.2  31 - 35 g/dL Final  . RDW 11/03/2019 13.2  11.6 - 15.4 % Final  . Platelets 11/03/2019 195  150 - 450 x10E3/uL Final  . Neutrophils 11/03/2019 60  Not Estab. % Final  . Lymphs 11/03/2019 27  Not Estab. % Final  . Monocytes 11/03/2019 8  Not Estab. % Final  . Eos 11/03/2019 3  Not Estab. % Final  . Basos 11/03/2019 1  Not Estab. % Final  . Neutrophils Absolute 11/03/2019 4.3  1 - 7 x10E3/uL Final  . Lymphocytes Absolute 11/03/2019 1.9  0 - 3 x10E3/uL Final  . Monocytes Absolute 11/03/2019 0.6  0 - 0 x10E3/uL Final  . EOS (ABSOLUTE) 11/03/2019 0.2  0.0 - 0.4 x10E3/uL Final  . Basophils Absolute 11/03/2019 0.0  0 - 0 x10E3/uL Final  . Immature Granulocytes 11/03/2019 1  Not Estab. % Final  . Immature Grans (Abs) 11/03/2019 0.1  0.0 - 0.1 x10E3/uL Final  . Glucose 11/03/2019 75  65 - 99 mg/dL Final  . BUN 11/03/2019 18  6 - 20 mg/dL Final  . Creatinine, Ser 11/03/2019 1.22  0.76 - 1.27 mg/dL Final  . GFR calc non Af Amer 11/03/2019 81  >59 mL/min/1.73 Final  . GFR calc Af Amer 11/03/2019 93  >59 mL/min/1.73 Final   Comment: **[facility] currently reports eGFR in compliance with the current**   recommendations of the [employer]. [facility] will   update reporting as new guidelines are published from the NKF-ASN   Task force.   . BUN/Creatinine Ratio 11/03/2019 15  9 - 20 Final  . Sodium 11/03/2019 147* 134 - 144 mmol/L Final  . Potassium 11/03/2019 4.4  3.5 - 5.2 mmol/L Final  . Chloride 11/03/2019 107* 96 - 106 mmol/L Final  . CO2 11/03/2019 24  20 - 29 mmol/L Final  . Calcium 11/03/2019 9.3  8.7 - 10.2 mg/dL Final  . Total Protein 11/03/2019 6.2  6.0 - 8.5 g/dL Final  . Albumin 11/03/2019 4.2  4.1 - 5.2 g/dL Final  . Globulin, Total 11/03/2019 2.0  1.5 - 4.5 g/dL Final  . Albumin/Globulin Ratio 11/03/2019 2.1  1.2 - 2.2 Final  . Bilirubin Total 11/03/2019 <0.2  0.0 -  1.2 mg/dL Final  . Alkaline Phosphatase 11/03/2019 93  39 - 117 IU/L Final  . AST 11/03/2019 15  0 - 40 IU/L Final  . ALT 11/03/2019 37  0 -  44 IU/L Final  . IgG (Immunoglobin G), Serum 11/03/2019 834  603 - 1,613 mg/dL Final  . IgA/Immunoglobulin A, Serum 11/03/2019 90  90 - 386 mg/dL Final  . IgM (Immunoglobulin M), Srm 11/03/2019 14* 20 - 172 mg/dL Final   Result confirmed on concentration.    Blood Alcohol level:  Lab Results  Component Value Date   ETH <10 01/25/2018   ETH <10 01/74/9449    Metabolic Disorder Labs: Lab Results  Component Value Date   HGBA1C 5.9 (H) 07/31/2017   MPG 122.63 07/31/2017   MPG 123 04/02/2015   No results found for: PROLACTIN Lab Results  Component Value Date   CHOL 113 01/29/2011   TRIG 52 01/29/2011   HDL 33 (L) 01/29/2011   CHOLHDL 3.4 01/29/2011   VLDL 10 01/29/2011   LDLCALC 70 01/29/2011    Therapeutic Lab Levels: Lab Results  Component Value Date   LITHIUM <0.06 (L) 01/25/2018   LITHIUM <0.06 (L) 04/20/2015   Lab Results  Component Value Date   VALPROATE <10 (L) 07/30/2017   VALPROATE 31 (L) 07/24/2017   No components found for:  CBMZ  Physical Findings     Musculoskeletal  Strength & Muscle Tone: within normal limits Gait & Station: normal Patient leans: N/A  Psychiatric Specialty Exam  Presentation  General Appearance: Fairly Groomed  Eye Contact:Minimal  Speech:Slow  Speech Volume:Decreased  Handedness:Right   Mood and Affect  Mood:Irritable  Affect:Flat   Thought Process  Thought Processes:Coherent  Descriptions of Associations:Intact  Orientation:Full (Time, Place and Person)  Thought Content:Logical  Hallucinations:Hallucinations: Auditory Description of Auditory Hallucinations: voices  Ideas of Reference:None  Suicidal Thoughts:Suicidal Thoughts: No  Homicidal Thoughts:Homicidal Thoughts: No   Sensorium  Memory:Immediate Fair;Recent Fair;Remote  Fair  Judgment:Fair  Insight:Fair   Executive Functions  Concentration:Fair  Attention Span:Fair  [place]   Psychomotor Activity  Psychomotor Activity:Psychomotor Activity: Restlessness   Assets  Assets:Housing;Social Support   Sleep  Sleep:Sleep: Poor   Physical Exam  Physical Exam Vitals and nursing note reviewed.  Constitutional:      Appearance: He is well-developed.  Cardiovascular:     Rate and Rhythm: Normal rate.  Pulmonary:     Effort: Pulmonary effort is normal.  Neurological:     Mental Status: He is alert and oriented to person, place, and time.    Review of Systems  Constitutional: Negative.   Respiratory: Negative for cough and shortness of breath.   Psychiatric/Behavioral: Negative for depression and suicidal ideas.   Blood pressure 106/90, pulse 88, temperature 97.7 F (36.5 C), temperature source Tympanic, resp. rate 18, height _0  (1.854 m), weight (!) 402 lb (182.3 kg), SpO2 100 %. Body mass index is 53.04 kg/m.  Treatment Plan Summary: Daily contact with patient to assess and evaluate symptoms and progress in treatment and Medication management   Inpatient hospitalization. Per [facility], no appropriate beds at [facility]. CSW to fax out to area hospitals.  Discontinue Abilify. Patient was given Abilify 15 mg this morning by night shift provider.  One-time dose Seroquel 200 mg this morning.  Start Seroquel 400 mg PO QHS for psychosis/sleep.  Start agitation protocol PRN agitation  Restart home medications: Norvasc 10 mg PO daily for HTN Eliquis 5 mg PO BID for hx PE Calcium-vitamin D PO daily for supplementation Colace 400 mg PO QHS for constipation Ferrous gluconate 400 mg PO daily for anemia Metformin 500 mg PO daily for diabetes Xalatan 1 gtt both eyes QHS  for glaucoma   Connye Burkitt, NP 04/04/2020 8:57 AM

## 2020-04-04 NOTE — ED Triage Notes (Signed)
[employer], Behavioral Health Team called to [facility], he was a patient there and required IVC. He had been agitated while there and responding so the initiated IVC and had him brought here.  Prior to arrival he was medicated at the [facility] with Haldol 10 mg, benadryl 50 mg and Ativan 2 mg IM and on admission to the Acute Unit pt is calm and asleep. He was changed out. Wanded by security.

## 2020-04-04 NOTE — ED Notes (Signed)
Patient is alert and verbal. Patient is noted talking with himself. Patient denies SI/HI this morning. Patient states he does has auditory hallucinations. Patient is cooperative and redirectable. Patient does have outburst at times. Monitoring continues.

## 2020-04-04 NOTE — ED Provider Notes (Signed)
Behavioral Health Admission H&P [facility] & OBS)  Date: 04/04/20 Patient Name: Brandon [name] MRN: [id number] Chief Complaint:  Chief Complaint  Patient presents with  . Medication Refill      Diagnoses:  Final diagnoses:  Disorganized schizophrenia ([place])    HPI: Brandon [name] is a 29 year old, male with a past medical history significant for multiple sclerosis and schizophrenia, who voluntarily presents with GPD to [facility]. During encounter, information provided by the patient was contradictory and his mother was contacted to provide more information. When asked how he got here, patient stated that he brought himself here. When asked where he lives, patient stated that he is currently living with his cousin and that his cousin knows he is here. Patient states that he came to [facility] for a medication refill because he ran out of his current medication. Patient was able to name the medications he needed and explained that we could check his chart to determine what medications to dispense out to him. Patient was asked if he had a ride and the patient stated he had a ride as long as we called them. When asked who was picking him up, the patient said it was his"other father." Patient was asked to elaborate but they could not.  Patient was once again asked who picked him up and he replied that he was at home when a officer picked him up and dropped him off. Patient states that he called the officer to pick him up. Patient denies suicide and homicidal ideation. Patient further denies auditory and visual hallucinations. Patient denies depression and anxiety. Patient endorses appetite, having at least 2 meals a day. Patient states he sleeps all right and gets 9 hours of sleep a night. Patient later retracted his former statement and replied that he does not sleep well.  [name] was immediately contacted after the encounter for more clarification. According to the mother, the  patient has been an active state of psychosis for the past 2 - 3 weeks. Mother states that the patient's medications had been changed a week prior but the current regimen is not very effective. Per mother, patient has been having many thoughts and the voices are intensifying. Mother states there are instances where the patient is talking to himself as well as screaming at people who are not present. The mother notes that the patient has repeatedly hit his hand on his head in attempt to rid himself of the voices. The mother states she would feel comfortable having her son stay overnight so that he achieve stabilization.  PHQ 2-9:     ED from 04/03/2020 in [place] No Risk       Total Time spent with patient: 30 minutes  Musculoskeletal  Strength & Muscle Tone: within normal limits Gait & Station: normal Patient leans: N/A  Psychiatric Specialty Exam  Presentation General Appearance: Appropriate for Environment;Fairly Groomed  Eye Contact:Fair  Speech:Normal Rate;Slurred  Speech Volume:Normal  Handedness:Right   Mood and Affect  Mood:Anxious  Affect:Congruent;Blunt   Thought Process  Thought Processes:Coherent  Descriptions of Associations:Intact  Orientation:Partial  Thought Content:Logical  Hallucinations:Hallucinations: None (Patient states he has not been experiencing any hallucinations, but his mother states that he has been frequently experiencing auditory hallucinations.)  Ideas of Reference:None  Suicidal Thoughts:Suicidal Thoughts: No  Homicidal Thoughts:Homicidal Thoughts: No   Sensorium  Memory:Immediate Fair;Recent Fair  Judgment:Fair  Insight:Poor   Executive Functions  Concentration:Fair  Attention Span:Fair  Recall:Poor  Fund of Knowledge:Poor  Language:Fair   Psychomotor Activity  Psychomotor Activity:Psychomotor Activity: Restlessness   Assets  Assets:Housing;Social  Support   Sleep  Sleep:Sleep: Poor (Per mother, patient hasn't had much rest and has been in an active state of psychosis)   Physical Exam Constitutional:      Appearance: Normal appearance.  HENT:     Head: Normocephalic and atraumatic.     Nose: Nose normal.  Eyes:     Extraocular Movements: Extraocular movements intact.     Pupils: Pupils are equal, round, and reactive to light.  Cardiovascular:     Rate and Rhythm: Normal rate and regular rhythm.  Pulmonary:     Effort: Pulmonary effort is normal.     Breath sounds: Normal breath sounds.  Musculoskeletal:        General: Normal range of motion.     Cervical back: Normal range of motion and neck supple.  Skin:    General: Skin is warm and dry.  Neurological:     General: No focal deficit present.  Psychiatric:        Mood and Affect: Mood normal.        Thought Content: Thought content normal.    Review of Systems  Constitutional: Negative.   HENT: Negative.   Eyes: Negative.   Respiratory: Negative.   Cardiovascular: Negative.   Gastrointestinal: Negative.   Musculoskeletal: Negative.   Skin: Negative.   Neurological: Negative.   Endo/Heme/Allergies: Negative.   Psychiatric/Behavioral: Negative for depression, hallucinations (Patient states that he is not currently experiencing any halluciantions) and suicidal ideas. The patient is not nervous/anxious.     Blood pressure 106/90, pulse 88, temperature 97.7 F (36.5 C), temperature source Tympanic, resp. rate 18, height 6' 1"  (1.854 m), weight (!) 402 lb (182.3 kg), SpO2 100 %. Body mass index is 53.04 kg/m.  Past Psychiatric History: Schizophrenia   Is the patient at risk to self? Yes  Has the patient been a risk to self in the past 6 months? No .    Has the patient been a risk to self within the distant past? No   Is the patient a risk to others? No   Has the patient been a risk to others in the past 6 months? No   Has the patient been a risk to others  within the distant past? No   Past Medical History:  Past Medical History:  Diagnosis Date  . ADHD (attention deficit hyperactivity disorder)   . Bipolar 1 disorder ([place])   . Chronic back pain   . Chronic constipation   . Chronic neck pain   . Hypertension   . Multiple sclerosis ([place]) 05/20/2013   left sided weakness, dysarthria  . Non-compliance   . Obesity   . Pulmonary embolism ([place])   . Schizophrenia ([place])   . Stroke [facility])    left sided deficits - pt's mother denies this    Past Surgical History:  Procedure Laterality Date  . NO PAST SURGERIES    . None    . TOOTH EXTRACTION N/A 06/24/2019   Procedure: DENTAL RESTORATION/EXTRACTION OF TEETH NUMBER ONE, SIXTEEN, SEVENTEEN, NINETEEN, THIRTY-TWO;  Surgeon: Diona Browner, DDS;  Location: [facility];  Service: Oral Surgery;  Laterality: N/A;    Family History:  Family History  Problem Relation Age of Onset  . Diabetes Mother   . ADD / ADHD Brother     Social History:  Social History   Socioeconomic History  . Marital status:  Single    Spouse name: Not on file  . Number of children: 0  . Years of education: 11th  . Highest education level: Not on file  Occupational History    Employer: [employer]    Comment: Disbaled  Tobacco Use  . Smoking status: Current Every Day Smoker    Packs/day: 0.25    Types: Cigarettes  . Smokeless tobacco: Never Used  . Tobacco comment: 3 cigarettes a day  Vaping Use  . Vaping Use: Never used  Substance and Sexual Activity  . Alcohol use: Not Currently    Alcohol/week: 0.0 standard drinks    Comment: "A little bit"   . Drug use: Not Currently    Types: Marijuana    Comment: Last used: unknown   . Sexual activity: Not on file  Other Topics Concern  . Not on file  Social History Narrative   Patient lives at home with his mother.   Disabled.   Education 11 th grade .   Right handed.   Caffeine - one cup daily soda.   Social Determinants of Health   Financial Resource  Strain:   . Difficulty of Paying Living Expenses: Not on file  Food Insecurity:   . Worried About Charity fundraiser in the Last Year: Not on file  . Ran Out of Food in the Last Year: Not on file  Transportation Needs:   . Lack of Transportation (Medical): Not on file  . Lack of Transportation (Non-Medical): Not on file  Physical Activity:   . Days of Exercise per Week: Not on file  . Minutes of Exercise per Session: Not on file  Stress:   . Feeling of Stress : Not on file  Social Connections:   . Frequency of Communication with Friends and Family: Not on file  . Frequency of Social Gatherings with Friends and Family: Not on file  . Attends Religious Services: Not on file  . Active Member of Clubs or Organizations: Not on file  . Attends Archivist Meetings: Not on file  . Marital Status: Not on file  Intimate Partner Violence:   . Fear of Current or Ex-Partner: Not on file  . Emotionally Abused: Not on file  . Physically Abused: Not on file  . Sexually Abused: Not on file    SDOH:  SDOH Screenings   Alcohol Screen:   . Last Alcohol Screening Score (AUDIT): Not on file  Depression (PHQ2-9):   . PHQ-2 Score: Not on file  Financial Resource Strain:   . Difficulty of Paying Living Expenses: Not on file  Food Insecurity:   . Worried About Charity fundraiser in the Last Year: Not on file  . Ran Out of Food in the Last Year: Not on file  Housing:   . Last Housing Risk Score: Not on file  Physical Activity:   . Days of Exercise per Week: Not on file  . Minutes of Exercise per Session: Not on file  Social Connections:   . Frequency of Communication with Friends and Family: Not on file  . Frequency of Social Gatherings with Friends and Family: Not on file  . Attends Religious Services: Not on file  . Active Member of Clubs or Organizations: Not on file  . Attends Archivist Meetings: Not on file  . Marital Status: Not on file  Stress:   . Feeling of  Stress : Not on file  Tobacco Use: High Risk  . Smoking  Tobacco Use: Current Every Day Smoker  . Smokeless Tobacco Use: Never Used  Transportation Needs:   . Film/video editor (Medical): Not on file  . Lack of Transportation (Non-Medical): Not on file    Last Labs:  Admission on 04/03/2020  Component Date Value Ref Range Status  . POC Amphetamine UR 04/03/2020 None Detected  None Detected Final  . POC Secobarbital (BAR) 04/03/2020 None Detected  None Detected Final  . POC Buprenorphine (BUP) 04/03/2020 None Detected  None Detected Final  . POC Oxazepam (BZO) 04/03/2020 None Detected  None Detected Final  . POC Cocaine UR 04/03/2020 None Detected  None Detected Final  . POC Methamphetamine UR 04/03/2020 None Detected  None Detected Final  . POC Morphine 04/03/2020 None Detected  None Detected Final  . POC Oxycodone UR 04/03/2020 None Detected  None Detected Final  . POC Methadone UR 04/03/2020 None Detected  None Detected Final  . POC Marijuana UR 04/03/2020 None Detected  None Detected Final  . SARS Coronavirus 2 Ag 04/03/2020 Negative  Negative Preliminary  Office Visit on 11/03/2019  Component Date Value Ref Range Status  . WBC 11/03/2019 7.0  3.4 - 10.8 x10E3/uL Final  . RBC 11/03/2019 4.76  4.14 - 5.80 x10E6/uL Final  . Hemoglobin 11/03/2019 12.7* 13.0 - 17.7 g/dL Final  . Hematocrit 11/03/2019 39.4  37.5 - 51.0 % Final  . MCV 11/03/2019 83  79 - 97 fL Final  . MCH 11/03/2019 26.7  26.6 - 33.0 pg Final  . MCHC 11/03/2019 32.2  31 - 35 g/dL Final  . RDW 11/03/2019 13.2  11.6 - 15.4 % Final  . Platelets 11/03/2019 195  150 - 450 x10E3/uL Final  . Neutrophils 11/03/2019 60  Not Estab. % Final  . Lymphs 11/03/2019 27  Not Estab. % Final  . Monocytes 11/03/2019 8  Not Estab. % Final  . Eos 11/03/2019 3  Not Estab. % Final  . Basos 11/03/2019 1  Not Estab. % Final  . Neutrophils Absolute 11/03/2019 4.3  1 - 7 x10E3/uL Final  . Lymphocytes Absolute 11/03/2019 1.9  0 - 3  x10E3/uL Final  . Monocytes Absolute 11/03/2019 0.6  0 - 0 x10E3/uL Final  . EOS (ABSOLUTE) 11/03/2019 0.2  0.0 - 0.4 x10E3/uL Final  . Basophils Absolute 11/03/2019 0.0  0 - 0 x10E3/uL Final  . Immature Granulocytes 11/03/2019 1  Not Estab. % Final  . Immature Grans (Abs) 11/03/2019 0.1  0.0 - 0.1 x10E3/uL Final  . Glucose 11/03/2019 75  65 - 99 mg/dL Final  . BUN 11/03/2019 18  6 - 20 mg/dL Final  . Creatinine, Ser 11/03/2019 1.22  0.76 - 1.27 mg/dL Final  . GFR calc non Af Amer 11/03/2019 81  >59 mL/min/1.73 Final  . GFR calc Af Amer 11/03/2019 93  >59 mL/min/1.73 Final   Comment: **[facility] currently reports eGFR in compliance with the current**   recommendations of the [employer]. [facility] will   update reporting as new guidelines are published from the NKF-ASN   Task force.   . BUN/Creatinine Ratio 11/03/2019 15  9 - 20 Final  . Sodium 11/03/2019 147* 134 - 144 mmol/L Final  . Potassium 11/03/2019 4.4  3.5 - 5.2 mmol/L Final  . Chloride 11/03/2019 107* 96 - 106 mmol/L Final  . CO2 11/03/2019 24  20 - 29 mmol/L Final  . Calcium 11/03/2019 9.3  8.7 - 10.2 mg/dL Final  . Total Protein 11/03/2019 6.2  6.0 - 8.5 g/dL Final  .  Albumin 11/03/2019 4.2  4.1 - 5.2 g/dL Final  . Globulin, Total 11/03/2019 2.0  1.5 - 4.5 g/dL Final  . Albumin/Globulin Ratio 11/03/2019 2.1  1.2 - 2.2 Final  . Bilirubin Total 11/03/2019 <0.2  0.0 - 1.2 mg/dL Final  . Alkaline Phosphatase 11/03/2019 93  39 - 117 IU/L Final  . AST 11/03/2019 15  0 - 40 IU/L Final  . ALT 11/03/2019 37  0 - 44 IU/L Final  . IgG (Immunoglobin G), Serum 11/03/2019 834  603 - 1,613 mg/dL Final  . IgA/Immunoglobulin A, Serum 11/03/2019 90  90 - 386 mg/dL Final  . IgM (Immunoglobulin M), Srm 11/03/2019 14* 20 - 172 mg/dL Final   Result confirmed on concentration.    Allergies: Patient has no known allergies.  PTA Medications: (Not in a hospital admission)   Medical Decision Making  Based on my evaluation,  patient is to be admitted to [facility] for continuous assessment. Admission labs were ordered and admitted. The following drug orders were made for the patient upon admission:  Aripiprazole 5 mg, PO oncer Aripiprazole 10 mg, PO 1 x daily Doxepin 50 mg, at bedtime PRN  Recommendations  Based on my evaluation the patient does not appear to have an emergency medical condition.  Malachy Mood, PA 04/04/20  2:51 AM

## 2020-04-05 DIAGNOSIS — F200 Paranoid schizophrenia: Secondary | ICD-10-CM | POA: Diagnosis not present

## 2020-04-05 LAB — CBG MONITORING, ED: Glucose-Capillary: 224 mg/dL — ABNORMAL HIGH (ref 70–99)

## 2020-04-05 MED ORDER — QUETIAPINE FUMARATE 300 MG PO TABS: 400.0000 mg | ORAL_TABLET | Freq: Every day | ORAL | Status: DC

## 2020-04-05 MED ORDER — LORAZEPAM 1 MG PO TABS: 1.0000 mg | ORAL_TABLET | ORAL | Status: DC | PRN

## 2020-04-05 MED ORDER — ADULT MULTIVITAMIN W/MINERALS CH
1.0000 | ORAL_TABLET | Freq: Every day | ORAL | Status: DC
  Administered 2020-04-05: 1 via ORAL
  Filled 2020-04-05: qty 1

## 2020-04-05 MED ORDER — APIXABAN 5 MG PO TABS
5.0000 mg | ORAL_TABLET | Freq: Two times a day (BID) | ORAL | Status: DC
  Filled 2020-04-05: qty 1

## 2020-04-05 MED ORDER — LATANOPROST 0.005 % OP SOLN
1.0000 [drp] | Freq: Every day | OPHTHALMIC | Status: DC
  Filled 2020-04-05: qty 2.5

## 2020-04-05 MED ORDER — FERROUS GLUCONATE 324 (38 FE) MG PO TABS
324.0000 mg | ORAL_TABLET | Freq: Every day | ORAL | Status: DC
  Filled 2020-04-05: qty 1

## 2020-04-05 MED ORDER — METFORMIN HCL ER 500 MG PO TB24
500.0000 mg | ORAL_TABLET | Freq: Every day | ORAL | Status: DC
  Filled 2020-04-05: qty 1

## 2020-04-05 MED ORDER — OLANZAPINE 10 MG PO TBDP: 10.0000 mg | ORAL_TABLET | Freq: Three times a day (TID) | ORAL | Status: DC | PRN

## 2020-04-05 MED ORDER — HYDROXYZINE HCL 25 MG PO TABS
25.0000 mg | ORAL_TABLET | Freq: Three times a day (TID) | ORAL | Status: DC | PRN
  Administered 2020-04-05: 25 mg via ORAL
  Filled 2020-04-05: qty 1

## 2020-04-05 MED ORDER — DOCUSATE SODIUM 100 MG PO CAPS: 400.0000 mg | ORAL_CAPSULE | Freq: Every day | ORAL | Status: DC

## 2020-04-05 MED ORDER — AMLODIPINE BESYLATE 5 MG PO TABS
10.0000 mg | ORAL_TABLET | Freq: Every day | ORAL | Status: DC
  Administered 2020-04-05: 10 mg via ORAL
  Filled 2020-04-05: qty 2

## 2020-04-05 MED ORDER — DOXEPIN HCL 50 MG PO CAPS: 50.0000 mg | ORAL_CAPSULE | Freq: Every day | ORAL | Status: DC

## 2020-04-05 NOTE — Progress Notes (Signed)
04/05/2020  Per [facility] there is no nurse on staff to take report and to call back in 15 minutes. Will continue to call.

## 2020-04-05 NOTE — Progress Notes (Signed)
04/05/2020  0913 Sheriff here to transport patient.

## 2020-04-05 NOTE — Progress Notes (Signed)
04/05/2020  0743  Called Sheriff to set up transport to [facility] [phone]. Sheriff will call back once they are on their way.

## 2020-04-05 NOTE — Progress Notes (Signed)
04/05/2020  0915  Called Report to [facility] [phone]. Report given to [place]. Patient guardian information given to [place].

## 2020-04-05 NOTE — Progress Notes (Signed)
04/05/2020  0746  [facility] to give report. Spoke with Pax and was told to call back after 8am.

## 2020-04-11 ENCOUNTER — Telehealth (HOSPITAL_COMMUNITY): Payer: Self-pay | Admitting: General Practice

## 2020-04-11 ENCOUNTER — Telehealth: Payer: Self-pay | Admitting: *Deleted

## 2020-04-11 NOTE — Telephone Encounter (Signed)
Brandon [name] called, wanting to find out if there is a new Dr's medication order for this patient. RN checked, there is none in Epic right now. Informed [name], verbalized understanding.

## 2020-04-11 NOTE — Telephone Encounter (Signed)
Received note form [facility] that they received from taylor about pt.  Pt receives Ocrevus every 6 months, due 8 months (December) originally scheduled 05-02-20 (which would have been 6 months).  This was changed. I spoke to pts mother.  Pt is in hospital right now for mental health issue.  Questioning the ocrevus appt.  I said I will call [facility] [phone] and check with them.  I called and spoke with [facility] at [facility] changed appt to 07-02-20 at 0900 for his 8 month Ocrevus infusion.  I spoke to [place] at [facility] infusion and he will check if there are orders for pt in system for this upcoming infusion.  He will call me back.

## 2020-04-12 NOTE — Telephone Encounter (Signed)
I called mother and relayed that new 8 month ocrevus infusion date at 07-02-20 at 0900, gave her [facility] #, [phone] also has appt with SS/NP 05-01-20 at 1315 with pt. She verbalized understanding.

## 2020-04-12 NOTE — Telephone Encounter (Signed)
Sarah NP informed need new orders in Epic for ocrevus.

## 2020-04-13 ENCOUNTER — Other Ambulatory Visit: Payer: Self-pay | Admitting: Neurology

## 2020-04-13 DIAGNOSIS — G35 Multiple sclerosis: Secondary | ICD-10-CM

## 2020-04-13 NOTE — Telephone Encounter (Signed)
Orders for Ocrevus will be placed.

## 2020-04-16 ENCOUNTER — Telehealth (HOSPITAL_COMMUNITY): Payer: Medicare Other | Admitting: Psychiatry

## 2020-04-19 ENCOUNTER — Ambulatory Visit (HOSPITAL_COMMUNITY)
Admission: EM | Admit: 2020-04-19 | Discharge: 2020-04-19 | Disposition: A | Discharge status: Home / Self Care | Payer: Medicare Other | Attending: Psychiatry | Admitting: Psychiatry

## 2020-04-19 DIAGNOSIS — F919 Conduct disorder, unspecified: Secondary | ICD-10-CM | POA: Diagnosis not present

## 2020-04-19 DIAGNOSIS — F79 Unspecified intellectual disabilities: Secondary | ICD-10-CM

## 2020-04-19 DIAGNOSIS — F201 Disorganized schizophrenia: Secondary | ICD-10-CM | POA: Diagnosis not present

## 2020-04-19 DIAGNOSIS — R451 Restlessness and agitation: Secondary | ICD-10-CM | POA: Diagnosis present

## 2020-04-19 NOTE — BH Assessment (Signed)
Comprehensive Clinical Assessment (CCA) Note  04/19/2020 Brandon [name] [id number]   Patient is a 29 year old male presenting voluntarily to [facility] for assessment from his day program, [facility]. Patient is calm and cooperative upon assessment. He states "They said I had an outburst but I did not." Patient states that he got mad at himself for forgetting his diabetes medications yesterday. He reports that he took all of his medications today. He denies SI/HI/AVH. Patient denies any substance use or criminal charges. Patient gives verbal consent for TTS to speak with his mother, Brier Reid at [phone].  Per collateral: Patient discharged from [facility] on Tuesday. She states medication changes were made but he has not had a change in behavior. She reports patient hits himself in the head due to [facility]. He is making threats to shoot people (does not have access to a gun). He becomes aggressive as evidenced by destroying property. He has not expressed SI/HI. He currently has medication management through [facility].  Per Reola Calkins, PMHNP this patient does not meet in patient care criteria. Patient to be transported home via [employer]. Mother/legal guardian made aware.  Visit Diagnosis:   Schizoaffective, Bipolar Type    ICD-10-CM   1. Behavior disturbance  F91.9   2. Unspecified intellectual disabilities  F79       CCA Screening, Triage and Referral (STR)  Patient did not complete STR.  CCA Biopsychosocial  Intake/Chief Complaint:  CCA Intake With Chief Complaint CCA Part Two Date: 04/19/20 CCA Part Two Time: 1318 Chief Complaint/Presenting Problem: NA Patient's Currently Reported Symptoms/Problems: NA Individual's Strengths: NA Individual's Preferences: NA Individual's Abilities: NA Type of Services Patient Feels Are Needed: NA Initial Clinical Notes/Concerns: NA  Mental Health Symptoms Depression:  Depression: None  Mania:  Mania: Recklessness  Anxiety:   Anxiety:  None  Psychosis:  Psychosis: Affective flattening/alogia/avolition, Grossly disorganized or catatonic behavior, Grossly disorganized speech, Duration of symptoms greater than six months  Trauma:  Trauma: None  Obsessions:  Obsessions: None  Compulsions:  Compulsions: None  Inattention:  Inattention: None  Hyperactivity/Impulsivity:  Hyperactivity/Impulsivity: N/A  Oppositional/Defiant Behaviors:  Oppositional/Defiant Behaviors: N/A  Emotional Irregularity:  Emotional Irregularity: N/A  Other Mood/Personality Symptoms:      Mental Status Exam Appearance and self-care  Stature:  Stature: Average  Weight:  Weight: Overweight  Clothing:  Clothing: Neat/clean  Grooming:  Grooming: Normal  Cosmetic use:  Cosmetic Use: None  Posture/gait:  Posture/Gait: Tense  Motor activity:  Motor Activity: Agitated, Restless  Sensorium  Attention:  Attention: Confused  Concentration:  Concentration: Scattered  Orientation:  Orientation: Person, Place  Recall/memory:  Recall/Memory: Normal (uta)  Affect and Mood  Affect:  Affect: Flat  Mood:  Mood: Anxious  Relating  Eye contact:  Eye Contact: Fleeting  Facial expression:  Facial Expression: Tense  Attitude toward examiner:  Attitude Toward Examiner: Cooperative  Thought and Language  Speech flow: Speech Flow: Garbled, Soft  Thought content:  Thought Content: Suspicious  Preoccupation:  Preoccupations: None  Hallucinations:  Hallucinations: Auditory, Visual  Organization:     Company secretary of Knowledge:  Fund of Knowledge: Fair Industrial/product designer)  Intelligence:  Intelligence: Needs investigation  Abstraction:  Abstraction: Normal Rich Reining)  Judgement:  Judgement: Impaired  Reality Testing:  Reality Testing: Unaware  Insight:  Insight: Lacking  Decision Making:  Decision Making: Confused, Only simple  Social Functioning  Social Maturity:  Social Maturity: Impulsive  Social Judgement:  Social Judgement: Naive  Stress  Stressors:  Coping  Ability:  Coping Ability: Normal  Skill Deficits:  Skill Deficits: Self-control, Responsibility  Supports:  Supports: Family     Religion: Religion/Spirituality Are You A Religious Person?: No  Leisure/Recreation: Leisure / Recreation Do You Have Hobbies?: Yes  Exercise/Diet: Exercise/Diet Do You Exercise?: No Have You Gained or Lost A Significant Amount of Weight in the Past Six Months?: No Do You Follow a Special Diet?: No Do You Have Any Trouble Sleeping?: No   CCA Employment/Education  Employment/Work Situation: Employment / Work Situation Employment situation: On disability What is the longest time patient has a held a job?: NA Where was the patient employed at that time?: NA Has patient ever been in the [employer]?: No  Education: Education Last Grade Completed: 12 Name of [employer] School: NA Did Garment/textile technologist From [employer]?: Yes Did Theme park manager?: No (not assessed) Did You Attend Graduate School?: No (not assessed) Did You Have An Individualized Education Program (IIEP): No (not assessed) Did You Have Any Difficulty At School?: No (not assessed) Patient's Education Has Been Impacted by Current Illness: No   CCA Family/Childhood History  Family and Relationship History: Family history Marital status: Single Are you sexually active?:  (not assessed) What is your sexual orientation?: not assessed Has your sexual activity been affected by drugs, alcohol, medication, or emotional stress?: not assessed Does patient have children?: No  Childhood History:  Childhood History By whom was/is the patient raised?: Mother Additional childhood history information: NA Description of patient's relationship with caregiver when they were a child: NA How were you disciplined when you got in trouble as a child/adolescent?: NA Does patient have siblings?: No Did patient suffer any verbal/emotional/physical/sexual abuse as a child?: No Has patient ever been sexually  abused/assaulted/raped as an adolescent or adult?: No Was the patient ever a victim of a crime or a disaster?: No Witnessed domestic violence?: No Has patient been affected by domestic violence as an adult?: No  Child/Adolescent Assessment:     CCA Substance Use  Alcohol/Drug Use: Alcohol / Drug Use Pain Medications: see MAR Prescriptions: see MAR Over the Counter: see MAR History of alcohol / drug use?: No history of alcohol / drug abuse                         ASAM's:  Six Dimensions of Multidimensional Assessment  Dimension 1:  Acute Intoxication and/or Withdrawal Potential:      Dimension 2:  Biomedical Conditions and Complications:      Dimension 3:  Emotional, Behavioral, or Cognitive Conditions and Complications:     Dimension 4:  Readiness to Change:     Dimension 5:  Relapse, Continued use, or Continued Problem Potential:     Dimension 6:  Recovery/Living Environment:     ASAM Severity Score:    ASAM Recommended Level of Treatment:     Substance use Disorder (SUD)    Recommendations for Services/Supports/Treatments:    DSM5 Diagnoses: Patient Active Problem List   Diagnosis Date Noted  . Unspecified intellectual disabilities 01/27/2020  . Migraine headache 04/18/2019  . Generalized weakness   . Pulmonary embolism (HCC) 08/12/2017  . Hyperglycemia 07/31/2017  . Multiple sclerosis exacerbation (HCC) 07/30/2017  . Acute encephalopathy 07/30/2017  . Disorganized schizophrenia (HCC)   . Hallucinations 08/05/2014  . Auditory hallucination   . Left-sided weakness 01/18/2014  . Multiple sclerosis (HCC) 05/20/2013  . 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 Abnormality of gait 11/23/2012  .  HTN (hypertension) 07/04/2011  . 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 Obesity, Class III, BMI 40-49.9 (morbid obesity) (HCC) 01/14/2010  . ADHD 01/14/2010    Patient Centered Plan:  Patient is on the following Treatment Plan(s):     Referrals to Alternative  Service(s): Referred to Alternative Service(s):   Place:   Date:   Time:    Referred to Alternative Service(s):   Place:   Date:   Time:    Referred to Alternative Service(s):   Place:   Date:   Time:    Referred to Alternative Service(s):   Place:   Date:   Time:     Celedonio Miyamoto

## 2020-04-19 NOTE — ED Provider Notes (Signed)
Behavioral Health Urgent Care Medical Screening Exam  Patient Name: Brandon [name] MRN: [id number] Date of Evaluation: 04/19/20 Chief Complaint:   Diagnosis:  Final diagnoses:  None    History of Present illness: Brandon [name] is a 29 y.o. male.  Patient presents to the [facility] as a walk-in voluntarily.  It was reported that he was brought in by police.  Patient is a little difficult to follow in his story line.  However he states that he got a little upset today and hit the wall underneath staircase.  Trying to distinguish exactly where the patient was that was difficult to follow.  He does deny having any suicidal or homicidal ideations and denies any hallucinations.  Patient is calm and cooperative while he is here in the assessment room and has shown no aggressive or threatening behaviors.  Patient does not appear to be responding to any internal or external stimuli.  Patient does have a legal guardian and TTS will contact him and is reported to be here the patient's mother.  TTS report contacting patient's legal guardian, his mother, and she reports that the patient was recently discharged from [facility] and she does not think that he is at his baseline.  She reports that over the last month the patient has had to go back to the hospital and he has been to [facility] health to be assessed a couple of different times.  She reports the patient becoming agitated easily and that [facility] and was hitting the wall and so they sent him to the [facility] for an evaluation.  Patient is unable to tell us exactly what got him upset today.  She reports that she is concerned about his behavior that was reported.  She is informed that the patient has been here for over an hour and has been calm, cooperative, and pleasant.  She is also informed that patient has denied any suicidal homicidal ideations and denies any hallucinations.  Based on chart review the  patient had graduated from his ACT with [facility] about a month ago.  Based off presentation of what is been reported it may be best for the mother to follow back up with [facility].  TTS will contact patient's mother to inform him that we will be transporting the patient home via safe transport and to follow-up with his outpatient providers and continue his current home medications.  Psychiatric Specialty Exam  Presentation  General Appearance:Appropriate for Environment;Casual;Fairly Groomed  Eye Contact:Fair  Speech:Slow  Speech Volume:Decreased  Handedness:Right   Mood and Affect  Mood:Euthymic  Affect:Appropriate;Congruent   Thought Process  Thought Processes:Coherent  Descriptions of Associations:Intact  Orientation:Full (Time, Place and Person)  Thought Content:WDL  Hallucinations:None voices  Ideas of Reference:None  Suicidal Thoughts:No  Homicidal Thoughts:No   Sensorium  Memory:Immediate Fair;Recent Fair;Remote Fair  Judgment:Fair  Insight:Fair   Executive Functions  Concentration:Fair  Attention Span:Fair  Recall:Fair  Fund of Knowledge:Fair  Language:Fair   Psychomotor Activity  Psychomotor Activity:Normal   Assets  Assets:Communication Skills;Desire for Improvement;Financial Resources/Insurance;Housing;Social Support   Sleep  Sleep:Fair  Number of hours: No data recorded  Physical Exam: Physical Exam Vitals and nursing note reviewed.  Constitutional:      Appearance: He is well-developed.  HENT:     Head: Normocephalic.  Eyes:     Pupils: Pupils are equal, round, and reactive to light.  Cardiovascular:     Rate and Rhythm: Normal rate.  Pulmonary:     Effort: Pulmonary effort is normal.  Musculoskeletal:        General: Normal range of motion.  Neurological:     Mental Status: He is alert and oriented to person, place, and time.    Review of Systems  Constitutional: Negative.   HENT: Negative.   Eyes: Negative.    Respiratory: Negative.   Cardiovascular: Negative.   Gastrointestinal: Negative.   Genitourinary: Negative.   Musculoskeletal: Negative.   Skin: Negative.   Neurological: Negative.   Endo/Heme/Allergies: Negative.   Psychiatric/Behavioral: Negative.    Blood pressure (!) 101/55, pulse 99, temperature 98.1 F (36.7 C), temperature source Oral, resp. rate 18, SpO2 97 %. There is no height or weight on file to calculate BMI.  Musculoskeletal: Strength & Muscle Tone: within normal limits Gait & Station: normal Patient leans: N/A   [facility] MSE Discharge Disposition for Follow up and Recommendations: Based on my evaluation the patient does not appear to have an emergency medical condition and can be discharged with resources and follow up care in outpatient services for Medication Management and Individual Therapy   Maryfrances Bunnell, FNP 04/19/2020, 12:57 PM

## 2020-04-19 NOTE — Discharge Instructions (Signed)

## 2020-04-19 NOTE — Progress Notes (Signed)
[place] received his AVS, his questions were answered and he was escorted to the lobby to wait for [employer].

## 2020-04-23 ENCOUNTER — Telehealth (HOSPITAL_COMMUNITY): Payer: Self-pay | Admitting: *Deleted

## 2020-04-23 NOTE — Telephone Encounter (Signed)
Call from patients mom stating her son is not sleeping at night. States he has not been sleeping and it hasnt gotten better with med changes and his recent hospitalization. States he can be heard in his room talking and laughing to himself. She believes the "voices" are keeping him up. He only sleeps about 2 hours and is always going. She would like to get this message to Dr Evelene Croon for some assistance for her sons sleep and his ongoing psychosis.

## 2020-04-24 ENCOUNTER — Telehealth (HOSPITAL_COMMUNITY): Payer: Self-pay | Admitting: *Deleted

## 2020-04-24 NOTE — Telephone Encounter (Signed)
Advice mom of walk-in hours for him to be seen sooner for optimal med management. Or coordinate with front desk to move his appt to a sooner date.

## 2020-04-24 NOTE — Telephone Encounter (Signed)
Called mom this am to clarify with her patients current med regimen. She reported since being discharged from [facility] about 3 weeks ago he is taking: Quetiapine 400 mg and 100 mg along with Doxepine 25 mg all at HS. Also, he recently has been [facility] to a CST program which he will be attending 5 days a week for 5 hours a day. Will give this info to Dr Evelene Croon for consideration and advice.

## 2020-04-24 NOTE — Telephone Encounter (Signed)
Is he taking Seroquel 400 mg at bedtime and also Olanzapine 10 mg at bedtime regularly as prescribed during his recent hospitalization ? Please verify this mom. As that is a decent dose of 2 quite sedating antipsychotics and is usually quite effective in helping pts sleep well.

## 2020-04-24 NOTE — Telephone Encounter (Signed)
I have spoken with mom three times today and last call offered her the walk in times but as she has to take Medicaid transport and she herself is in a wheelchair her flexibility is limited and said she needs a specific time to tell the [employer] bus and a pick up time otherwise she and her son  will have to wait here potentially for a long time. Also, due to her sons poor sleep he doesn't start the day very early. Gave her the times and she chose wed between 8-10 am but due to the nature of walk ins cant promise time.

## 2020-04-25 ENCOUNTER — Telehealth (HOSPITAL_COMMUNITY): Payer: Self-pay | Admitting: *Deleted

## 2020-04-25 NOTE — Telephone Encounter (Signed)
Mom called back and left a message for writer she arranged a ride for herself and patient via [employer] bus for Thurs at 9 am which is designated time with Link Snuffer PA not Dr Evelene Croon who has seen him in the past.

## 2020-04-26 ENCOUNTER — Other Ambulatory Visit: Payer: Self-pay

## 2020-04-26 ENCOUNTER — Telehealth (HOSPITAL_COMMUNITY): Payer: Self-pay | Admitting: *Deleted

## 2020-04-26 ENCOUNTER — Encounter (HOSPITAL_COMMUNITY): Payer: Self-pay | Admitting: Psychiatry

## 2020-04-26 ENCOUNTER — Ambulatory Visit (INDEPENDENT_AMBULATORY_CARE_PROVIDER_SITE_OTHER): Payer: Medicare Other | Admitting: Psychiatry

## 2020-04-26 VITALS — BP 154/138 | HR 106 | Wt >= 6400 oz

## 2020-04-26 DIAGNOSIS — F79 Unspecified intellectual disabilities: Secondary | ICD-10-CM | POA: Diagnosis not present

## 2020-04-26 DIAGNOSIS — F201 Disorganized schizophrenia: Secondary | ICD-10-CM

## 2020-04-26 MED ORDER — TEMAZEPAM 30 MG PO CAPS: 30.0000 mg | ORAL_CAPSULE | Freq: Every evening | ORAL | 0 refills | Status: DC | PRN

## 2020-04-26 MED ORDER — BREXPIPRAZOLE 1 MG PO TABS: ORAL_TABLET | ORAL | 0 refills | Status: DC

## 2020-04-26 MED ORDER — MELATONIN 10 MG PO TABS: 10.0000 mg | ORAL_TABLET | Freq: Every evening | ORAL | 0 refills | Status: DC

## 2020-04-26 NOTE — Telephone Encounter (Signed)
Well, I want him to try the sample first and based on how he does will send Rx. He has enough samplea until his next visit.

## 2020-04-26 NOTE — Telephone Encounter (Signed)
Brandon [name] ** [name] (Key: [id number]) - [id number]   INFO SUBMITTED BY JESSICA NORTON ,CMA @ [employer].  INFORMED BY [facility] [place] [facility] PATIENT HAS MEDICAID & MEDICARE PART D.  PLEASE SUBMIT rX FOR  3 MG REXULTI TO  Rx (ALREADY NOTIFIED)

## 2020-04-26 NOTE — Progress Notes (Signed)
BH MD/PA/NP OP Progress Note    04/26/2020 9:14 AM Brandon [name]  MRN:  [id number]  Chief Complaint:  Chief Complaint    Walk-In     As per mom, " He is not sleeping." As per patient," The voices are there."  HPI: Patient was last seen by the writer on September 16 and at that time he was switched to Abilify as he was complaining of ongoing hallucinations which were not improving.  He was also started on doxepin to address poor sleep as he was not sleeping much at night. However on September 28 mom had to bring him to [facility] for an urgent evaluation as he continued to have hallucinations and poor sleep despite the med changes.  He was evaluated and overnight assessment.  On September 29 he was IVC due to continued psychotic symptoms and was transferred to [facility] for further stabilization.  He stayed there for about a week and was discharged on Seroquel 400 mg at bedtime and doxepin.  Mom brought back to [facility] on October 14 as he continued to have hallucinations and was still not sleeping.  Apparently they added olanzapine to his dose of Seroquel. Mom contacted the clinic couple of days ago to report that she has not noticed any improvement in his hallucinations and also he is not sleeping at all during the night. Writer had advised the nurse to advise the mom to bring him as a walk-in today for an urgent evaluation by the Clinical research associate.  Patient presented with his mother as a walk-in today. Mom reported that he continuously hears several voices and also is not sleeping at all during the night.  He has been taking Seroquel 400 mg at bedtime along with doxepin she is not sure of the exact dose of doxepin. During the session patient was noted to be internally preoccupied and was looking around the room and mumbling to himself.  He was noted to have earbuds in his ears which were not connected to an electronic device.  When asked why he had those earbuds was he did not answer however his  mother reported that sometimes he wears them to cancel the voices but they do not help much.  Sometimes he uses earplugs to hear music which seems to help better. Mom clarified that the patient has always been connected with ACT team and is [facility] from 9-12.  The caseworker there believes that he needs higher level of care and they are referring him to [facility]. Margarita was unable to elaborate more details about the voices he was hearing however he did state that there are so many of them. He denies any suicidal or homicidal ideations. Mom stated that when the voices get overwhelming he starts punching his head to get those voices out of his head.  She stated that he has never hurt anyone else other than himself to get those voices out of his head.  Regarding sleep, writer recommended that he try temazepam at 30 mg dose can also help with the anxiety.  She was also advised that she can also give it to him along with melatonin for optimal sleep. Regarding ongoing hallucinations, patient has history of MS.  Mother was asked if there is a possibility that his MS could be exacerbating his hallucinations to which she replied that he was diagnosed with schizophrenia first followed by MS.  He is followed at [facility] for  MS.  Past meds tried-risperidone, Invega, Abilify, olanzapine, doxepin, trazodone.  Writer recommended trial of Rexulti to target his psychotic symptoms.  Writer provided the mother with samples with plan to titrate the dose from 1 mg to 2 mg and then to 3 mg over the course of 2 weeks.  He is returning back to see the Clinical research associate on November 8.  Mother and patient were agreeable to this recommendation.  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 Unspecified intellectual disabilities  F79     Past Psychiatric History: Schizophrenia, ID and currently being admitted  Past Medical  History:  Past Medical History:  Diagnosis Date  . ADHD (attention deficit hyperactivity disorder)   . Bipolar 1 disorder (HCC)   . Chronic back pain   . Chronic constipation   . Chronic neck pain   . Hypertension   . Multiple sclerosis (HCC) 05/20/2013   left sided weakness, dysarthria  . Non-compliance   . Obesity   . Pulmonary embolism (HCC)   . Schizophrenia (HCC)   . Stroke [facility])    left sided deficits - pt's mother denies this    Past Surgical History:  Procedure Laterality Date  . NO PAST SURGERIES    . None    . TOOTH EXTRACTION N/A 06/24/2019   Procedure: DENTAL RESTORATION/EXTRACTION OF TEETH NUMBER ONE, SIXTEEN, SEVENTEEN, NINETEEN, THIRTY-TWO;  Surgeon: Ocie Doyne, DDS;  Location: [facility] OR;  Service: Oral Surgery;  Laterality: N/A;    Family Psychiatric History: Brother- ADHD  Family History:  Family History  Problem Relation Age of Onset  . Diabetes Mother   . ADD / ADHD Brother     Social History:  Social History   Socioeconomic History  . Marital status: Single    Spouse name: Not on file  . Number of children: 0  . Years of education: 11th  . Highest education level: Not on file  Occupational History    Employer: [employer]    Comment: Disbaled  Tobacco Use  . Smoking status: Current Every Day Smoker    Packs/day: 0.25    Types: Cigarettes  . Smokeless tobacco: Never Used  . Tobacco comment: 3 cigarettes a day  Vaping Use  . Vaping Use: Never used  Substance and Sexual Activity  . Alcohol use: Not Currently    Alcohol/week: 0.0 standard drinks    Comment: "A little bit"   . Drug use: Not Currently    Types: Marijuana    Comment: Last used: unknown   . Sexual activity: Not on file  Other Topics Concern  . Not on file  Social History Narrative   Patient lives at home with his mother.   Disabled.   Education 11 th grade .   Right handed.   Caffeine - one cup daily soda.   Social Determinants of Health   Financial Resource  Strain:   . Difficulty of Paying Living Expenses: Not on file  Food Insecurity:   . Worried About Programme researcher, broadcasting/film/video in the Last Year: Not on file  . Ran Out of Food in the Last Year: Not on file  Transportation Needs:   . Lack of Transportation (Medical): Not on file  . Lack of Transportation (Non-Medical): Not on file  Physical Activity:   . Days of Exercise per Week: Not on file  . Minutes of Exercise per Session: Not on file  Stress:   . Feeling of Stress :  Not on file  Social Connections:   . Frequency of Communication with Friends and Family: Not on file  . Frequency of Social Gatherings with Friends and Family: Not on file  . Attends Religious Services: Not on file  . Active Member of Clubs or Organizations: Not on file  . Attends Banker Meetings: Not on file  . Marital Status: Not on file    Allergies: No Known Allergies  Metabolic Disorder Labs: Lab Results  Component Value Date   HGBA1C 5.9 (H) 07/31/2017   MPG 122.63 07/31/2017   MPG 123 04/02/2015   No results found for: PROLACTIN Lab Results  Component Value Date   CHOL 113 01/29/2011   TRIG 52 01/29/2011   HDL 33 (L) 01/29/2011   CHOLHDL 3.4 01/29/2011   VLDL 10 01/29/2011   LDLCALC 70 01/29/2011   Lab Results  Component Value Date   TSH 0.848 01/18/2014   TSH 1.854 07/03/2011    Therapeutic Level Labs: Lab Results  Component Value Date   LITHIUM <0.06 (L) 01/25/2018   LITHIUM <0.06 (L) 04/20/2015   Lab Results  Component Value Date   VALPROATE <10 (L) 07/30/2017   VALPROATE 31 (L) 07/24/2017   No components found for:  CBMZ  Current Medications: Current Outpatient Medications  Medication Sig Dispense Refill  . amLODipine (NORVASC) 10 MG tablet Take 10 mg by mouth daily.    Marland Kitchen apixaban (ELIQUIS) 5 MG TABS tablet Take 5 mg by mouth 2 (two) times daily.     . Calcium Citrate-Vitamin D (CALCIUM + D PO) Take 1 tablet by mouth daily.    Marland Kitchen docusate sodium (COLACE) 100 MG capsule  Take 400 mg by mouth at bedtime.     . ferrous gluconate (IRON 27) 240 (27 FE) MG tablet Take 240 mg by mouth daily.    . hydrOXYzine (ATARAX/VISTARIL) 25 MG tablet Take 1 tablet (25 mg total) by mouth 3 (three) times daily as needed for anxiety. 30 tablet 0  . latanoprost (XALATAN) 0.005 % ophthalmic solution Place 1 drop into both eyes at bedtime.    Marland Kitchen LORazepam (ATIVAN) 1 MG tablet Take 1 tablet (1 mg total) by mouth as needed for anxiety (severe agitation). 30 tablet 0  . metFORMIN (GLUCOPHAGE-XR) 500 MG 24 hr tablet Take 500 mg by mouth daily with breakfast.    . Multiple Vitamin (MULTIVITAMIN WITH MINERALS) TABS tablet Take 1 tablet by mouth daily.    Marland Kitchen ocrelizumab in sodium chloride 0.9 % 500 mL CHANGE ::  Ocrevus 600mg  IV EVERY 8 (EIGHT) months. 1 each 2   No current facility-administered medications for this visit.      Psychiatric Specialty Exam: Review of Systems  Blood pressure (!) 154/138, pulse (!) 106, weight (!) 408 lb (185.1 kg).Body mass index is 53.83 kg/m.  General Appearance: Fairly groomed  Eye Contact:  Fair  Speech:  Slow and monotonus  Volume:  Decreased  Mood:  Anxious  Affect:  Restricted  Thought Process:  Goal Directed and Descriptions of Associations: Intact, Responding to internal stimuli, mumbling to self intermittently  Orientation:  Full (Time, Place, and Person)  Thought Content: Illogical, Hallucinations: Auditory and Paranoid Ideation   Suicidal Thoughts:  No  Homicidal Thoughts:  No  Memory:  Immediate;   Good Recent;   Good  Judgement:  Fair  Insight:  Fair  Psychomotor Activity:  Decreased  Concentration:  Concentration: Good and Attention Span: Fair  Recall:  of Knowledge: Poor  Language: Good  Akathisia:  Negative  Handed:  Right  AIMS (if indicated): not done  Assets:  Communication Skills Desire for Improvement Financial Resources/Insurance Housing Social Support  ADL's:  Intact  Cognition: WNL  Sleep:  Poor      Assessment and Plan: Patient seen as walk-in along with his mother for ongoing auditory hallucinations and insomnia.  Mother has not found Seroquel or doxepin to be effective for either of these symptoms.  They were agreeable to a trial of Rexulti samples of which were provided.  We will also prescribe temazepam for insomnia. Potential side effects of medication and risks vs benefits of treatment vs non-treatment were explained and discussed. All questions were answered.  1. Disorganized schizophrenia (HCC)  - Discontinue Seroquel and Doxepin due to lack of efficacy. - Start brexpiprazole (REXULTI) 1 MG TABS tablet; Start with 1 mg daily for 3 days then titrate to 2 mg daily for 7 days then take 3 mg daily  Dispense: 30 tablet; Refill: 0 - temazepam (RESTORIL) 30 MG capsule; Take 1 capsule (30 mg total) by mouth at bedtime as needed for sleep.  Dispense: 30 capsule; Refill: 0 - Melatonin 10 MG TABS; Take 10 mg by mouth at bedtime.  Dispense: 30 tablet; Refill: 0  2. Unspecified intellectual disabilities   F/up in 3 weeks for close monitoring.   Zena Amos, MD 04/26/2020, 9:14 AM

## 2020-04-27 ENCOUNTER — Telehealth (HOSPITAL_COMMUNITY): Payer: Self-pay | Admitting: *Deleted

## 2020-04-27 MED FILL — LATANOPROST 0.005% OPTH SOL: 0.005 | 75 days supply | Qty: 8 | Fill #0

## 2020-04-27 NOTE — Telephone Encounter (Signed)
VM from mom saying one of his medicines wasn't covered by [employer]. Chart indicates Rexaltti but he has samples of it for now. Called [employer] and reported it was the Temazepam that it wouldn't cover. I have not received a prior auth for that med. Pharm sent it to me now and writer completed it and faxed it back. Notified mom of current status. Dr Evelene Croon is not here at this hour to write a new RX. Writer called [employer] again to check on the price if it was purchased out of pocket and not run thru [place] and it would be 30. Called mom to tell her the options but would wait till 5 pm to see if it went thru Medicaid and she was going to call pharm to see if she could purchase a partial Rx to get thru weekend if it doesn't get approved today by 5.

## 2020-04-30 ENCOUNTER — Other Ambulatory Visit: Payer: Self-pay

## 2020-04-30 ENCOUNTER — Encounter (HOSPITAL_COMMUNITY): Payer: Self-pay | Admitting: Emergency Medicine

## 2020-04-30 ENCOUNTER — Ambulatory Visit (HOSPITAL_COMMUNITY)
Admission: EM | Admit: 2020-04-30 | Discharge: 2020-04-30 | Disposition: A | Discharge status: Home / Self Care | Payer: Medicare Other | Attending: Behavioral Health | Admitting: Behavioral Health

## 2020-04-30 DIAGNOSIS — Z86711 Personal history of pulmonary embolism: Secondary | ICD-10-CM | POA: Insufficient documentation

## 2020-04-30 DIAGNOSIS — F17210 Nicotine dependence, cigarettes, uncomplicated: Secondary | ICD-10-CM | POA: Diagnosis not present

## 2020-04-30 DIAGNOSIS — Z8673 Personal history of transient ischemic attack (TIA), and cerebral infarction without residual deficits: Secondary | ICD-10-CM | POA: Insufficient documentation

## 2020-04-30 DIAGNOSIS — G35 Multiple sclerosis: Secondary | ICD-10-CM | POA: Insufficient documentation

## 2020-04-30 DIAGNOSIS — Z760 Encounter for issue of repeat prescription: Secondary | ICD-10-CM | POA: Insufficient documentation

## 2020-04-30 DIAGNOSIS — F201 Disorganized schizophrenia: Secondary | ICD-10-CM | POA: Diagnosis present

## 2020-04-30 DIAGNOSIS — F79 Unspecified intellectual disabilities: Secondary | ICD-10-CM | POA: Diagnosis not present

## 2020-04-30 DIAGNOSIS — Z79899 Other long term (current) drug therapy: Secondary | ICD-10-CM | POA: Insufficient documentation

## 2020-04-30 DIAGNOSIS — Z7984 Long term (current) use of oral hypoglycemic drugs: Secondary | ICD-10-CM | POA: Diagnosis not present

## 2020-04-30 DIAGNOSIS — I10 Essential (primary) hypertension: Secondary | ICD-10-CM | POA: Diagnosis not present

## 2020-04-30 DIAGNOSIS — Z20822 Contact with and (suspected) exposure to covid-19: Secondary | ICD-10-CM | POA: Insufficient documentation

## 2020-04-30 DIAGNOSIS — Z7901 Long term (current) use of anticoagulants: Secondary | ICD-10-CM | POA: Insufficient documentation

## 2020-04-30 LAB — POCT URINE DRUG SCREEN - MANUAL ENTRY (I-SCREEN)
POC Amphetamine UR: NOT DETECTED
POC Buprenorphine (BUP): NOT DETECTED
POC Cocaine UR: NOT DETECTED
POC Marijuana UR: NOT DETECTED
POC Methadone UR: NOT DETECTED
POC Methamphetamine UR: NOT DETECTED
POC Morphine: NOT DETECTED
POC Oxazepam (BZO): NOT DETECTED
POC Oxycodone UR: NOT DETECTED
POC Secobarbital (BAR): NOT DETECTED

## 2020-04-30 LAB — RESPIRATORY PANEL BY RT PCR (FLU A&B, COVID)
Influenza A by PCR: NEGATIVE
Influenza B by PCR: NEGATIVE
SARS Coronavirus 2 by RT PCR: NEGATIVE

## 2020-04-30 LAB — COMPREHENSIVE METABOLIC PANEL
ALT: 37 U/L (ref 0–44)
AST: 16 U/L (ref 15–41)
Albumin: 3.9 g/dL (ref 3.5–5.0)
Alkaline Phosphatase: 69 U/L (ref 38–126)
Anion gap: 10 (ref 5–15)
BUN: 15 mg/dL (ref 6–20)
CO2: 23 mmol/L (ref 22–32)
Calcium: 9.5 mg/dL (ref 8.9–10.3)
Chloride: 106 mmol/L (ref 98–111)
Creatinine, Ser: 0.92 mg/dL (ref 0.61–1.24)
GFR, Estimated: >60 mL/min (ref >60)
Glucose, Bld: 88 mg/dL (ref 70–99)
Potassium: 3.6 mmol/L (ref 3.5–5.1)
Sodium: 139 mmol/L (ref 135–145)
Total Bilirubin: 0.7 mg/dL (ref 0.3–1.2)
Total Protein: 6.4 g/dL — ABNORMAL LOW (ref 6.5–8.1)

## 2020-04-30 LAB — HEMOGLOBIN A1C
Hgb A1c MFr Bld: 5.9 % — ABNORMAL HIGH (ref 4.8–5.6)
Mean Plasma Glucose: 123 mg/dL

## 2020-04-30 LAB — POC SARS CORONAVIRUS 2 AG -  ED: SARS Coronavirus 2 Ag: NEGATIVE

## 2020-04-30 LAB — CBC WITH DIFFERENTIAL/PLATELET
Abs Immature Granulocytes: 0.04 10*3/uL (ref 0.00–0.07)
Basophils Absolute: 0 10*3/uL (ref 0.0–0.1)
Basophils Relative: 1 %
Eosinophils Absolute: 0.3 10*3/uL (ref 0.0–0.5)
Eosinophils Relative: 5 %
HCT: 39 % (ref 39.0–52.0)
Hemoglobin: 11.9 g/dL — ABNORMAL LOW (ref 13.0–17.0)
Immature Granulocytes: 1 %
Lymphocytes Relative: 28 %
Lymphs Abs: 2 10*3/uL (ref 0.7–4.0)
MCH: 25.4 pg — ABNORMAL LOW (ref 26.0–34.0)
MCHC: 30.5 g/dL (ref 30.0–36.0)
MCV: 83.2 fL (ref 80.0–100.0)
Monocytes Absolute: 0.6 10*3/uL (ref 0.1–1.0)
Monocytes Relative: 8 %
Neutro Abs: 4.2 10*3/uL (ref 1.7–7.7)
Neutrophils Relative %: 57 %
Platelets: 201 10*3/uL (ref 150–400)
RBC: 4.69 MIL/uL (ref 4.22–5.81)
RDW: 14.3 % (ref 11.5–15.5)
WBC: 7.2 10*3/uL (ref 4.0–10.5)
nRBC: 0 % (ref 0.0–0.2)

## 2020-04-30 LAB — LIPID PANEL
Cholesterol: 141 mg/dL (ref 0–200)
HDL: 37 mg/dL — ABNORMAL LOW (ref >40)
LDL Cholesterol: 77 mg/dL (ref 0–99)
Total CHOL/HDL Ratio: 3.8 RATIO
Triglycerides: 134 mg/dL (ref <150)
VLDL: 27 mg/dL (ref 0–40)

## 2020-04-30 LAB — MAGNESIUM: Magnesium: 1.5 mg/dL — ABNORMAL LOW (ref 1.7–2.4)

## 2020-04-30 LAB — TSH: TSH: 4.628 u[IU]/mL — ABNORMAL HIGH (ref 0.350–4.500)

## 2020-04-30 LAB — ETHANOL: Alcohol, Ethyl (B): <10 mg/dL (ref <10)

## 2020-04-30 LAB — GLUCOSE, CAPILLARY: Glucose-Capillary: 89 mg/dL (ref 70–99)

## 2020-04-30 LAB — POC SARS CORONAVIRUS 2 AG: SARS Coronavirus 2 Ag: NEGATIVE

## 2020-04-30 MED ORDER — METFORMIN HCL ER 500 MG PO TB24
500.0000 mg | ORAL_TABLET | Freq: Every day | ORAL | Status: DC
  Administered 2020-04-30: 500 mg via ORAL
  Filled 2020-04-30: qty 1

## 2020-04-30 MED ORDER — MAGNESIUM HYDROXIDE 400 MG/5ML PO SUSP: 30.0000 mL | Freq: Every day | ORAL | Status: DC | PRN

## 2020-04-30 MED ORDER — HYDROXYZINE HCL 25 MG PO TABS: 25.0000 mg | ORAL_TABLET | Freq: Three times a day (TID) | ORAL | Status: DC | PRN

## 2020-04-30 MED ORDER — ACETAMINOPHEN 325 MG PO TABS: 650.0000 mg | ORAL_TABLET | Freq: Four times a day (QID) | ORAL | Status: DC | PRN

## 2020-04-30 MED ORDER — BREXPIPRAZOLE 2 MG PO TABS
2.0000 mg | ORAL_TABLET | Freq: Every day | ORAL | Status: DC
  Administered 2020-04-30: 2 mg via ORAL
  Filled 2020-04-30: qty 1

## 2020-04-30 MED ORDER — AMLODIPINE BESYLATE 10 MG PO TABS
10.0000 mg | ORAL_TABLET | Freq: Every day | ORAL | Status: DC
  Administered 2020-04-30: 10 mg via ORAL
  Filled 2020-04-30: qty 1

## 2020-04-30 MED ORDER — TEMAZEPAM 30 MG PO CAPS: 30.0000 mg | ORAL_CAPSULE | Freq: Every evening | ORAL | 0 refills | Status: DC | PRN

## 2020-04-30 MED ORDER — BREXPIPRAZOLE 1 MG PO TABS
1.0000 mg | ORAL_TABLET | Freq: Every day | ORAL | Status: DC
  Filled 2020-04-30: qty 1

## 2020-04-30 MED ORDER — LORAZEPAM 1 MG PO TABS
1.0000 mg | ORAL_TABLET | Freq: Once | ORAL | Status: AC
  Administered 2020-04-30: 1 mg via ORAL
  Filled 2020-04-30: qty 1

## 2020-04-30 MED ORDER — ALUM & MAG HYDROXIDE-SIMETH 200-200-20 MG/5ML PO SUSP: 30.0000 mL | ORAL | Status: DC | PRN

## 2020-04-30 MED ORDER — TEMAZEPAM 30 MG PO CAPS
30.0000 mg | ORAL_CAPSULE | Freq: Every evening | ORAL | Status: DC | PRN
  Administered 2020-04-30: 30 mg via ORAL
  Filled 2020-04-30: qty 1

## 2020-04-30 MED ORDER — MELATONIN 5 MG PO TABS
10.0000 mg | ORAL_TABLET | Freq: Once | ORAL | Status: AC
  Administered 2020-04-30: 10 mg via ORAL
  Filled 2020-04-30: qty 2

## 2020-04-30 NOTE — ED Provider Notes (Addendum)
Behavioral Health Admission [facility])  Date: 04/30/20 Patient Name: Brandon [name] MRN: [id number] Chief Complaint:  Chief Complaint  Patient presents with  . Hallucinations      Diagnoses: Disorganized schizophrenia   HPI: Brandon [name] is a 29 year old male with a history of schizophrenia who voluntarily presents with [facility] to [facility]. During the patient assessment, he was responding to internal stimuli. Every question that was asked to him, he would turn his head and speak to the walls. He was then asked if there were someone behind him that he was seeing? He voiced "no." He continued to do that until the assessment was over. There were periods that he was left alone, and he was having an entire conversation with himself. He began to get loud. He presented to be in an argument with someone. The patient started yelling, and he had to be re-directed by staff. The patient continues to provide contradictory information. TTS Counselor Ms. Cannon Kettle, made attempts to contact the patient's mom with his permission, but Ms. Stoverwas unsuccessful. The patient was asked how he got to the hospital? The patient stated that he had brought himself here. The patient says he came to [facility] for a medication refill because he ran out of his current medication. He also voiced that his prescriptions are not working well for him.   The patient denies suicide and homicidal ideation. The patient endorses his appetite is good, having at least 2 meals a day. The patient states he sleeps suitable, but mom collaboration the patient has not for several days. The patient later retracted his former statement and replied that he did not sleep well.  PHQ 2-9:     ED from 04/03/2020 in [place] No Risk       Total Time spent with patient: 20 minutes  Musculoskeletal  Strength & Muscle Tone: within normal limits Gait & Station: normal Patient leans:  N/A  Psychiatric Specialty Exam  Presentation General Appearance: Appropriate for Environment;Casual;Fairly Groomed  Eye Contact:Fair  Speech:Slow  Speech Volume:Decreased  Handedness:Right   Mood and Affect  Mood:Euphoric  Affect:Inappropriate   Thought Process  Thought Processes:Disorganized  Descriptions of Associations:Loose  Orientation:Partial  Thought Content:Obsessions;Delusions;Scattered  Hallucinations:Hallucinations: Auditory Description of Auditory Hallucinations: Voices  Ideas of Reference:Delusions  Suicidal Thoughts:Suicidal Thoughts: No  Homicidal Thoughts:Homicidal Thoughts: No   Sensorium  Memory:Immediate Fair;Recent Fair;Remote Fair  Judgment:Fair  Insight:Poor   Executive Functions  Concentration:Fair  Attention Span:Fair  [place]   Psychomotor Activity  Psychomotor Activity:Psychomotor Activity: Decreased   Assets  Assets:Communication Skills;Desire for Improvement;Financial Resources/Insurance;Social Support   Sleep  Sleep:Sleep: Fair   Physical Exam ROS  Blood pressure (!) 139/95, pulse 85, temperature 97.8 F (36.6 C), resp. rate 18, SpO2 97 %. There is no height or weight on file to calculate BMI.  Past Psychiatric History:   Is the patient at risk to self? No  Has the patient been a risk to self in the past 6 months? No .    Has the patient been a risk to self within the distant past? No   Is the patient a risk to others? No   Has the patient been a risk to others in the past 6 months? No   Has the patient been a risk to others within the distant past? No   Past Medical History:  Past Medical History:  Diagnosis Date  . ADHD (attention deficit hyperactivity  disorder)   . Bipolar 1 disorder ([place])   . Chronic back pain   . Chronic constipation   . Chronic neck pain   . Hypertension   . Multiple sclerosis ([place]) 05/20/2013   left sided weakness, dysarthria   . Non-compliance   . Obesity   . Pulmonary embolism ([place])   . Schizophrenia ([place])   . Stroke [facility])    left sided deficits - pt's mother denies this    Past Surgical History:  Procedure Laterality Date  . NO PAST SURGERIES    . None    . TOOTH EXTRACTION N/A 06/24/2019   Procedure: DENTAL RESTORATION/EXTRACTION OF TEETH NUMBER ONE, SIXTEEN, SEVENTEEN, NINETEEN, THIRTY-TWO;  Surgeon: Diona Browner, DDS;  Location: [place];  Service: Oral Surgery;  Laterality: N/A;    Family History:  Family History  Problem Relation Age of Onset  . Diabetes Mother   . ADD / ADHD Brother     Social History:  Social History   Socioeconomic History  . Marital status: Single    Spouse name: Not on file  . Number of children: 0  . Years of education: 11th  . Highest education level: Not on file  Occupational History    Employer: [employer]    Comment: Disbaled  Tobacco Use  . Smoking status: Current Every Day Smoker    Packs/day: 0.25    Types: Cigarettes  . Smokeless tobacco: Never Used  . Tobacco comment: 3 cigarettes a day  Vaping Use  . Vaping Use: Never used  Substance and Sexual Activity  . Alcohol use: Not Currently    Alcohol/week: 0.0 standard drinks    Comment: "A little bit"   . Drug use: Not Currently    Types: Marijuana    Comment: Last used: unknown   . Sexual activity: Not on file  Other Topics Concern  . Not on file  Social History Narrative   Patient lives at home with his mother.   Disabled.   Education 11 th grade .   Right handed.   Caffeine - one cup daily soda.   Social Determinants of Health   Financial Resource Strain:   . Difficulty of Paying Living Expenses: Not on file  Food Insecurity:   . Worried About Charity fundraiser in the Last Year: Not on file  . Ran Out of Food in the Last Year: Not on file  Transportation Needs:   . Lack of Transportation (Medical): Not on file  . Lack of Transportation (Non-Medical): Not on file  Physical  Activity:   . Days of Exercise per Week: Not on file  . Minutes of Exercise per Session: Not on file  Stress:   . Feeling of Stress : Not on file  Social Connections:   . Frequency of Communication with Friends and Family: Not on file  . Frequency of Social Gatherings with Friends and Family: Not on file  . Attends Religious Services: Not on file  . Active Member of Clubs or Organizations: Not on file  . Attends Archivist Meetings: Not on file  . Marital Status: Not on file  Intimate Partner Violence:   . Fear of Current or Ex-Partner: Not on file  . Emotionally Abused: Not on file  . Physically Abused: Not on file  . Sexually Abused: Not on file    SDOH:  SDOH Screenings   Alcohol Screen:   . Last Alcohol Screening Score (AUDIT): Not on file  Depression (PHQ2-9):   .  PHQ-2 Score: Not on file  Financial Resource Strain:   . Difficulty of Paying Living Expenses: Not on file  Food Insecurity:   . Worried About Charity fundraiser in the Last Year: Not on file  . Ran Out of Food in the Last Year: Not on file  Housing:   . Last Housing Risk Score: Not on file  Physical Activity:   . Days of Exercise per Week: Not on file  . Minutes of Exercise per Session: Not on file  Social Connections:   . Frequency of Communication with Friends and Family: Not on file  . Frequency of Social Gatherings with Friends and Family: Not on file  . Attends Religious Services: Not on file  . Active Member of Clubs or Organizations: Not on file  . Attends Archivist Meetings: Not on file  . Marital Status: Not on file  Stress:   . Feeling of Stress : Not on file  Tobacco Use: High Risk  . Smoking Tobacco Use: Current Every Day Smoker  . Smokeless Tobacco Use: Never Used  Transportation Needs:   . Film/video editor (Medical): Not on file  . Lack of Transportation (Non-Medical): Not on file    Last Labs:  Admission on 04/04/2020, Discharged on 04/05/2020  Component  Date Value Ref Range Status  . Sodium 04/04/2020 141  135 - 145 mmol/L Final  . Potassium 04/04/2020 4.1  3.5 - 5.1 mmol/L Final  . Chloride 04/04/2020 103  98 - 111 mmol/L Final  . CO2 04/04/2020 26  22 - 32 mmol/L Final  . Glucose, Bld 04/04/2020 111* 70 - 99 mg/dL Final   Glucose reference range applies only to samples taken after fasting for at least 8 hours.  . BUN 04/04/2020 17  6 - 20 mg/dL Final  . Creatinine, Ser 04/04/2020 0.92  0.61 - 1.24 mg/dL Final  . Calcium 04/04/2020 9.4  8.9 - 10.3 mg/dL Final  . Total Protein 04/04/2020 7.1  6.5 - 8.1 g/dL Final  . Albumin 04/04/2020 4.1  3.5 - 5.0 g/dL Final  . AST 04/04/2020 13* 15 - 41 U/L Final  . ALT 04/04/2020 31  0 - 44 U/L Final  . Alkaline Phosphatase 04/04/2020 79  38 - 126 U/L Final  . Total Bilirubin 04/04/2020 0.6  0.3 - 1.2 mg/dL Final  . GFR calc non Af Amer 04/04/2020 >60  >60 mL/min Final  . GFR calc Af Amer 04/04/2020 >60  >60 mL/min Final  . Anion gap 04/04/2020 12  5 - 15 Final   Performed at [facility], [place] [address], [place], [place] [address]  . Alcohol, Ethyl (B) 04/04/2020 <10  <10 mg/dL Final   Comment: (NOTE) Lowest detectable limit for serum alcohol is 10 mg/dL.  For medical purposes only. Performed at [facility], [place] [address] [place], [place] [address]   . Opiates 04/04/2020 NONE DETECTED  NONE DETECTED Final  . Cocaine 04/04/2020 NONE DETECTED  NONE DETECTED Final  . Benzodiazepines 04/04/2020 NONE DETECTED  NONE DETECTED Final  . Amphetamines 04/04/2020 NONE DETECTED  NONE DETECTED Final  . Tetrahydrocannabinol 04/04/2020 NONE DETECTED  NONE DETECTED Final  . Barbiturates 04/04/2020 NONE DETECTED  NONE DETECTED Final   Comment: (NOTE) DRUG SCREEN FOR MEDICAL PURPOSES ONLY.  IF CONFIRMATION IS NEEDED FOR ANY PURPOSE, NOTIFY LAB WITHIN 5 DAYS.  LOWEST DETECTABLE LIMITS FOR URINE DRUG SCREEN Drug Class  Cutoff (ng/mL) Amphetamine  and metabolites    1000 Barbiturate and metabolites    200 Benzodiazepine                 161 Tricyclics and metabolites     300 Opiates and metabolites        300 Cocaine and metabolites        300 THC                            50 Performed at [facility], [place] [address] [place], [place] [address]   . WBC 04/04/2020 7.7  4.0 - 10.5 K/uL Final  . RBC 04/04/2020 4.99  4.22 - 5.81 MIL/uL Final  . Hemoglobin 04/04/2020 13.0  13.0 - 17.0 g/dL Final  . HCT 04/04/2020 41.6  39 - 52 % Final  . MCV 04/04/2020 83.4  80.0 - 100.0 fL Final  . MCH 04/04/2020 26.1  26.0 - 34.0 pg Final  . MCHC 04/04/2020 31.3  30.0 - 36.0 g/dL Final  . RDW 04/04/2020 14.0  11.5 - 15.5 % Final  . Platelets 04/04/2020 173  150 - 400 K/uL Final  . nRBC 04/04/2020 0.0  0.0 - 0.2 % Final  . Neutrophils Relative % 04/04/2020 59  % Final  . Neutro Abs 04/04/2020 4.5  1.7 - 7.7 K/uL Final  . Lymphocytes Relative 04/04/2020 28  % Final  . Lymphs Abs 04/04/2020 2.1  0.7 - 4.0 K/uL Final  . Monocytes Relative 04/04/2020 10  % Final  . Monocytes Absolute 04/04/2020 0.8  0.1 - 1.0 K/uL Final  . Eosinophils Relative 04/04/2020 3  % Final  . Eosinophils Absolute 04/04/2020 0.3  0.0 - 0.5 K/uL Final  . Basophils Relative 04/04/2020 0  % Final  . Basophils Absolute 04/04/2020 0.0  0.0 - 0.1 K/uL Final  . Immature Granulocytes 04/04/2020 0  % Final  . Abs Immature Granulocytes 04/04/2020 0.03  0.00 - 0.07 K/uL Final   Performed at [facility], [place] [address], [place], [place] [address]  . Glucose-Capillary 04/05/2020 224* 70 - 99 mg/dL Final   Glucose reference range applies only to samples taken after fasting for at least 8 hours.  Admission on 04/03/2020, Discharged on 04/04/2020  Component Date Value Ref Range Status  . SARS Coronavirus 2 by RT PCR 04/03/2020 NEGATIVE  NEGATIVE Final   Comment: (NOTE) SARS-CoV-2 target nucleic acids are NOT DETECTED.  The SARS-CoV-2 RNA is  generally detectable in upper respiratoy specimens during the acute phase of infection. The lowest concentration of SARS-CoV-2 viral copies this assay can detect is 131 copies/mL. A negative result does not preclude SARS-Cov-2 infection and should not be used as the sole basis for treatment or other patient management decisions. A negative result may occur with  improper specimen collection/handling, submission of specimen other than nasopharyngeal swab, presence of viral mutation(s) within the areas targeted by this assay, and inadequate number of viral copies (<131 copies/mL). A negative result must be combined with clinical observations, patient history, and epidemiological information. The expected result is Negative.  Fact Sheet for Patients:  PinkCheek.be  Fact Sheet for Healthcare Providers:  GravelBags.it  This test is no                          t yet approved or cleared by the [place] FDA and  has been authorized for detection and/or  diagnosis of SARS-CoV-2 by FDA under an Emergency Use Authorization (EUA). This EUA will remain  in effect (meaning this test can be used) for the duration of the COVID-19 declaration under Section 564(b)(1) of the Act, 21 U.S.C. section 360bbb-3(b)(1), unless the authorization is terminated or revoked sooner.    . Influenza A by PCR 04/03/2020 NEGATIVE  NEGATIVE Final  . Influenza B by PCR 04/03/2020 NEGATIVE  NEGATIVE Final   Comment: (NOTE) The Xpert Xpress SARS-CoV-2/FLU/RSV assay is intended as an aid in  the diagnosis of influenza from Nasopharyngeal swab specimens and  should not be used as a sole basis for treatment. Nasal washings and  aspirates are unacceptable for Xpert Xpress SARS-CoV-2/FLU/RSV  testing.  Fact Sheet for Patients: PinkCheek.be  Fact Sheet for Healthcare Providers: GravelBags.it  This test  is not yet approved or cleared by the [place] FDA and  has been authorized for detection and/or diagnosis of SARS-CoV-2 by  FDA under an Emergency Use Authorization (EUA). This EUA will remain  in effect (meaning this test can be used) for the duration of the  Covid-19 declaration under Section 564(b)(1) of the Act, 21  U.S.C. section 360bbb-3(b)(1), unless the authorization is  terminated or revoked. Performed at [facility], [place] [address] [place], [place] [address]   . POC Amphetamine UR 04/03/2020 None Detected  None Detected Final  . POC Secobarbital (BAR) 04/03/2020 None Detected  None Detected Final  . POC Buprenorphine (BUP) 04/03/2020 None Detected  None Detected Final  . POC Oxazepam (BZO) 04/03/2020 None Detected  None Detected Final  . POC Cocaine UR 04/03/2020 None Detected  None Detected Final  . POC Methamphetamine UR 04/03/2020 None Detected  None Detected Final  . POC Morphine 04/03/2020 None Detected  None Detected Final  . POC Oxycodone UR 04/03/2020 None Detected  None Detected Final  . POC Methadone UR 04/03/2020 None Detected  None Detected Final  . POC Marijuana UR 04/03/2020 None Detected  None Detected Final  . SARS Coronavirus 2 Ag 04/03/2020 Negative  Negative Preliminary  Office Visit on 11/03/2019  Component Date Value Ref Range Status  . WBC 11/03/2019 7.0  3.4 - 10.8 x10E3/uL Final  . RBC 11/03/2019 4.76  4.14 - 5.80 x10E6/uL Final  . Hemoglobin 11/03/2019 12.7* 13.0 - 17.7 g/dL Final  . Hematocrit 11/03/2019 39.4  37.5 - 51.0 % Final  . MCV 11/03/2019 83  79 - 97 fL Final  . MCH 11/03/2019 26.7  26.6 - 33.0 pg Final  . MCHC 11/03/2019 32.2  31 - 35 g/dL Final  . RDW 11/03/2019 13.2  11.6 - 15.4 % Final  . Platelets 11/03/2019 195  150 - 450 x10E3/uL Final  . Neutrophils 11/03/2019 60  Not Estab. % Final  . Lymphs 11/03/2019 27  Not Estab. % Final  . Monocytes 11/03/2019 8  Not Estab. % Final  . Eos 11/03/2019 3  Not Estab. % Final  .  Basos 11/03/2019 1  Not Estab. % Final  . Neutrophils Absolute 11/03/2019 4.3  1.40 - 7.00 x10E3/uL Final  . Lymphocytes Absolute 11/03/2019 1.9  0 - 3 x10E3/uL Final  . Monocytes Absolute 11/03/2019 0.6  0 - 0 x10E3/uL Final  . EOS (ABSOLUTE) 11/03/2019 0.2  0.0 - 0.4 x10E3/uL Final  . Basophils Absolute 11/03/2019 0.0  0 - 0 x10E3/uL Final  . Immature Granulocytes 11/03/2019 1  Not Estab. % Final  . Immature Grans (Abs) 11/03/2019 0.1  0.0 - 0.1 x10E3/uL Final  .  Glucose 11/03/2019 75  65 - 99 mg/dL Final  . BUN 11/03/2019 18  6 - 20 mg/dL Final  . Creatinine, Ser 11/03/2019 1.22  0.76 - 1.27 mg/dL Final  . GFR calc non Af Amer 11/03/2019 81  >59 mL/min/1.73 Final  . GFR calc Af Amer 11/03/2019 93  >59 mL/min/1.73 Final   Comment: **[facility] currently reports eGFR in compliance with the current**   recommendations of the [employer]. [facility] will   update reporting as new guidelines are published from the NKF-ASN   Task force.   . BUN/Creatinine Ratio 11/03/2019 15  9 - 20 Final  . Sodium 11/03/2019 147* 134 - 144 mmol/L Final  . Potassium 11/03/2019 4.4  3.5 - 5.2 mmol/L Final  . Chloride 11/03/2019 107* 96 - 106 mmol/L Final  . CO2 11/03/2019 24  20 - 29 mmol/L Final  . Calcium 11/03/2019 9.3  8.7 - 10.2 mg/dL Final  . Total Protein 11/03/2019 6.2  6.0 - 8.5 g/dL Final  . Albumin 11/03/2019 4.2  4.1 - 5.2 g/dL Final  . Globulin, Total 11/03/2019 2.0  1.5 - 4.5 g/dL Final  . Albumin/Globulin Ratio 11/03/2019 2.1  1.2 - 2.2 Final  . Bilirubin Total 11/03/2019 <0.2  0.0 - 1.2 mg/dL Final  . Alkaline Phosphatase 11/03/2019 93  39 - 117 IU/L Final  . AST 11/03/2019 15  0 - 40 IU/L Final  . ALT 11/03/2019 37  0 - 44 IU/L Final  . IgG (Immunoglobin G), Serum 11/03/2019 834  603 - 1,613 mg/dL Final  . IgA/Immunoglobulin A, Serum 11/03/2019 90  90 - 386 mg/dL Final  . IgM (Immunoglobulin M), Srm 11/03/2019 14* 20 - 172 mg/dL Final   Result confirmed on concentration.     Allergies: Patient has no known allergies.  PTA Medications: (Not in a hospital admission)   Medical Decision Making   Recommendations  Based on my evaluation the patient does not appear to have an emergency medical condition.  Caroline Sauger, NP 04/30/20  1:51 AM

## 2020-04-30 NOTE — ED Provider Notes (Signed)
[facility]/OBS ASAP Discharge Summary  Date and Time: 04/30/2020 8:41 AM  Name: Brandon [name]  MRN:  [id number]   Discharge Diagnoses:  Final diagnoses:  Disorganized schizophrenia (HCC)  Unspecified intellectual disabilities    Subjective: Patient reports today that he is feeling better.  He states that he needed to get some rest and he was actually able to get some rest last night.  Today the patient is able to tell me that he was started on new medications last week after seeing Dr. Evelene Croon and one of the medications was not able to be filled at the pharmacy.  Patient reports that he was taking Rexulti and he had only taken 1 pill yesterday which was his third day and it was supposed to be increased today.  Patient denies any suicidal homicidal ideations and denies any hallucinations.  Patient admits to having hallucinations yesterday but states that now that he was able to get some sleep he was feeling much better today. Contacted patient's mother, legal guardian, to confirm story and to establish safety plan.  She confirms that the patient was prescribed Restoril and Rexulti and she still has samples of Rexulti but the pharmacy stated that they were unable to fill her Restoril to give the patient.  She states that they told her that the insurance would not cover it and then at that time she cannot afford to pay for it.  She states that she has to use a pharmacy that can deliver.  After lengthy discussion about medications, the RN had contacted [place] pharmacy and found out that the price was greatly reduced without insurance and they also do deliveries.  [name] was provided with the phone number for [facility] pharmacy so that she could pay for the patient's Restoril as well as have them delivered.  She reports that she feels that the patient had not slept since Friday because he had not had his sleep medication and that was what was making him worse.  She states that if he was able to sleep last night  and is sounding much better today than she has no concerns with him transporting home.  She requests that we have him transported directly home since she has his legal guardian that she cannot drive.  She was informed that the patient will be transported by safe transport.  Stay Summary: Patient is a 29 year old male with a history of schizophrenia who presented voluntarily to the [facility] via Patent examiner.  Patient had presented responding to internal stimuli and appeared to be slightly paranoid and it was reported that the patient has been sleeping well.  Patient was admitted to the continuous observation unit and had his home medications restarted.  Patient was able to sleep for several hours and when awakened patient was denying any suicidal or homicidal ideations and had denied any hallucinations.  Patient was very logical and knowledgeable about his medications as well as the events that led up to him not sleeping for 2 or 3 days.  After discussing with outpatient RN, patient's mother, and patient, and pharmacy it was decided to send a new prescription for the Restoril 30 mg p.o. nightly to [place] pharmacy as this was a more affordable choice for the patient and the patient's mother.  The patient continued to have his Rexulti at home and today he was increased to 2 mg p.o. daily.  Patient was instructed to continue with the tapering plan based on Dr. Carie Caddy note.  [name] was informed  of the same.  At this time the patient does not meet inpatient psychiatric treatment criteria and had continued to deny any suicidal or homicidal ideations and denied any hallucinations.  Patient was discharged home and provided transportation via safe transport.  Medications were E prescribed to pharmacy of choice.  Total Time spent with patient: 30 minutes  Past Psychiatric History: Bipolar 1 disorder, schizophrenia, numerous inpatient visits and ED visits.  Current with [facility] Past Medical  History:  Past Medical History:  Diagnosis Date  . ADHD (attention deficit hyperactivity disorder)   . Bipolar 1 disorder (HCC)   . Chronic back pain   . Chronic constipation   . Chronic neck pain   . Hypertension   . Multiple sclerosis (HCC) 05/20/2013   left sided weakness, dysarthria  . Non-compliance   . Obesity   . Pulmonary embolism (HCC)   . Schizophrenia (HCC)   . Stroke [facility])    left sided deficits - pt's mother denies this    Past Surgical History:  Procedure Laterality Date  . NO PAST SURGERIES    . None    . TOOTH EXTRACTION N/A 06/24/2019   Procedure: DENTAL RESTORATION/EXTRACTION OF TEETH NUMBER ONE, SIXTEEN, SEVENTEEN, NINETEEN, THIRTY-TWO;  Surgeon: Ocie Doyne, DDS;  Location: [facility] OR;  Service: Oral Surgery;  Laterality: N/A;   Family History:  Family History  Problem Relation Age of Onset  . Diabetes Mother   . ADD / ADHD Brother    Family Psychiatric History: Brother-ADHD Social History:  Social History   Substance and Sexual Activity  Alcohol Use Not Currently  . Alcohol/week: 0.0 standard drinks   Comment: "A little bit"      Social History   Substance and Sexual Activity  Drug Use Not Currently  . Types: Marijuana   Comment: Last used: unknown     Social History   Socioeconomic History  . Marital status: Single    Spouse name: Not on file  . Number of children: 0  . Years of education: 11th  . Highest education level: Not on file  Occupational History    Employer: [employer]    Comment: Disbaled  Tobacco Use  . Smoking status: Current Every Day Smoker    Packs/day: 0.25    Types: Cigarettes  . Smokeless tobacco: Never Used  . Tobacco comment: 3 cigarettes a day  Vaping Use  . Vaping Use: Never used  Substance and Sexual Activity  . Alcohol use: Not Currently    Alcohol/week: 0.0 standard drinks    Comment: "A little bit"   . Drug use: Not Currently    Types: Marijuana    Comment: Last used: unknown   . Sexual  activity: Not on file  Other Topics Concern  . Not on file  Social History Narrative   Patient lives at home with his mother.   Disabled.   Education 11 th grade .   Right handed.   Caffeine - one cup daily soda.   Social Determinants of Health   Financial Resource Strain:   . Difficulty of Paying Living Expenses: Not on file  Food Insecurity:   . Worried About Programme researcher, broadcasting/film/video in the Last Year: Not on file  . Ran Out of Food in the Last Year: Not on file  Transportation Needs:   . Lack of Transportation (Medical): Not on file  . Lack of Transportation (Non-Medical): Not on file  Physical Activity:   . Days of Exercise  per Week: Not on file  . Minutes of Exercise per Session: Not on file  Stress:   . Feeling of Stress : Not on file  Social Connections:   . Frequency of Communication with Friends and Family: Not on file  . Frequency of Social Gatherings with Friends and Family: Not on file  . Attends Religious Services: Not on file  . Active Member of Clubs or Organizations: Not on file  . Attends Banker Meetings: Not on file  . Marital Status: Not on file   SDOH:  SDOH Screenings   Alcohol Screen:   . Last Alcohol Screening Score (AUDIT): Not on file  Depression (PHQ2-9):   . PHQ-2 Score: Not on file  Financial Resource Strain:   . Difficulty of Paying Living Expenses: Not on file  Food Insecurity:   . Worried About Programme researcher, broadcasting/film/video in the Last Year: Not on file  . Ran Out of Food in the Last Year: Not on file  Housing:   . Last Housing Risk Score: Not on file  Physical Activity:   . Days of Exercise per Week: Not on file  . Minutes of Exercise per Session: Not on file  Social Connections:   . Frequency of Communication with Friends and Family: Not on file  . Frequency of Social Gatherings with Friends and Family: Not on file  . Attends Religious Services: Not on file  . Active Member of Clubs or Organizations: Not on file  . Attends Tax inspector Meetings: Not on file  . Marital Status: Not on file  Stress:   . Feeling of Stress : Not on file  Tobacco Use: High Risk  . Smoking Tobacco Use: Current Every Day Smoker  . Smokeless Tobacco Use: Never Used  Transportation Needs:   . Freight forwarder (Medical): Not on file  . Lack of Transportation (Non-Medical): Not on file    Has this patient used any form of tobacco in the last 30 days? (Cigarettes, Smokeless Tobacco, Cigars, and/or Pipes) A prescription for an FDA-approved tobacco cessation medication was offered at discharge and the patient refused  Current Medications:  Current Facility-Administered Medications  Medication Dose Route Frequency Provider Last Rate Last Admin  . acetaminophen (TYLENOL) tablet 650 mg  650 mg Oral Q6H PRN Gillermo Murdoch, NP      . alum & mag hydroxide-simeth (MAALOX/MYLANTA) 200-200-20 MG/5ML suspension 30 mL  30 mL Oral Q4H PRN Gillermo Murdoch, NP      . amLODipine (NORVASC) tablet 10 mg  10 mg Oral Daily Gillermo Murdoch, NP      . brexpiprazole (REXULTI) tablet 2 mg  2 mg Oral Daily Avital Dancy, Gerlene Burdock, FNP      . hydrOXYzine (ATARAX/VISTARIL) tablet 25 mg  25 mg Oral TID PRN Gillermo Murdoch, NP      . magnesium hydroxide (MILK OF MAGNESIA) suspension 30 mL  30 mL Oral Daily PRN Gillermo Murdoch, NP      . metFORMIN (GLUCOPHAGE-XR) 24 hr tablet 500 mg  500 mg Oral Q breakfast Gillermo Murdoch, NP      . temazepam (RESTORIL) capsule 30 mg  30 mg Oral QHS PRN Gillermo Murdoch, NP   30 mg at 04/30/20 0244   Current Outpatient Medications  Medication Sig Dispense Refill  . amLODipine (NORVASC) 10 MG tablet Take 10 mg by mouth daily.    Marland Kitchen apixaban (ELIQUIS) 5 MG TABS tablet Take 5 mg by mouth 2 (two) times daily.     Marland Kitchen  brexpiprazole (REXULTI) 1 MG TABS tablet Start with 1 mg daily for 3 days then titrate to 2 mg daily for 7 days then take 3 mg daily 30 tablet 0  . Calcium Citrate-Vitamin D (CALCIUM + D PO)  Take 1 tablet by mouth daily.    Marland Kitchen docusate sodium (COLACE) 100 MG capsule Take 400 mg by mouth at bedtime.     . ferrous gluconate (IRON 27) 240 (27 FE) MG tablet Take 240 mg by mouth daily.    . hydrOXYzine (ATARAX/VISTARIL) 25 MG tablet Take 1 tablet (25 mg total) by mouth 3 (three) times daily as needed for anxiety. 30 tablet 0  . latanoprost (XALATAN) 0.005 % ophthalmic solution Place 1 drop into both eyes at bedtime.    . Melatonin 10 MG TABS Take 10 mg by mouth at bedtime. 30 tablet 0  . metFORMIN (GLUCOPHAGE-XR) 500 MG 24 hr tablet Take 500 mg by mouth daily with breakfast.    . Multiple Vitamin (MULTIVITAMIN WITH MINERALS) TABS tablet Take 1 tablet by mouth daily.    Marland Kitchen ocrelizumab in sodium chloride 0.9 % 500 mL CHANGE ::  Ocrevus  IV EVERY 8 (EIGHT) months. 1 each 2  . temazepam (RESTORIL) 30 MG capsule Take 1 capsule (30 mg total) by mouth at bedtime as needed for sleep. 30 capsule 0    PTA Medications: (Not in a hospital admission)   Musculoskeletal  Strength & Muscle Tone: within normal limits Gait & Station: normal Patient leans: N/A  Psychiatric Specialty Exam  Presentation  General Appearance: Appropriate for Environment;Casual  Eye Contact:Good  Speech:Clear and Coherent;Slow  Speech Volume:Normal  Handedness:Right   Mood and Affect  Mood:Euthymic  Affect:Appropriate;Congruent   Thought Process  Thought Processes:Coherent  Descriptions of Associations:Intact  Orientation:Full (Time, Place and Person)  Thought Content:WDL  Hallucinations:Hallucinations: None Description of Auditory Hallucinations: Voices  Ideas of Reference:None  Suicidal Thoughts:Suicidal Thoughts: No  Homicidal Thoughts:Homicidal Thoughts: No   Sensorium  Memory:Immediate Good;Recent Good;Remote Good  Judgment:Fair  Insight:Fair   Executive Functions  Concentration:Good  Attention Span:Good  Recall:Good  Fund of  Knowledge:Good  Language:Good   Psychomotor Activity  Psychomotor Activity:Psychomotor Activity: Normal   Assets  Assets:Communication Skills;Desire for Improvement;Financial Resources/Insurance;Housing;Social Support;Transportation   Sleep  Sleep:Sleep: Good   Physical Exam  Physical Exam Vitals and nursing note reviewed.  Constitutional:      Appearance: He is well-developed.  HENT:     Head: Normocephalic.  Eyes:     Pupils: Pupils are equal, round, and reactive to light.  Cardiovascular:     Rate and Rhythm: Normal rate.  Pulmonary:     Effort: Pulmonary effort is normal.  Musculoskeletal:        General: Normal range of motion.  Neurological:     Mental Status: He is alert and oriented to person, place, and time.    Review of Systems  Constitutional: Negative.   HENT: Negative.   Eyes: Negative.   Respiratory: Negative.   Cardiovascular: Negative.   Gastrointestinal: Negative.   Genitourinary: Negative.   Musculoskeletal: Negative.   Skin: Negative.   Neurological: Negative.   Endo/Heme/Allergies: Negative.   Psychiatric/Behavioral: Negative.    Blood pressure 122/89, pulse 86, temperature 97.7 F (36.5 C), temperature source Temporal, resp. rate 18, SpO2 98 %. There is no height or weight on file to calculate BMI.  Demographic Factors:  Male  Loss Factors: NA  Historical Factors: NA  Risk Reduction Factors:   Sense of responsibility to family, Living with another  person, especially a relative, Positive social support, Positive therapeutic relationship and Positive coping skills or problem solving skills  Continued Clinical Symptoms:  Previous Psychiatric Diagnoses and Treatments  Cognitive Features That Contribute To Risk:  None    Suicide Risk:  Minimal: No identifiable suicidal ideation.  Patients presenting with no risk factors but with morbid ruminations; may be classified as minimal risk based on the severity of the depressive  symptoms  Plan Of Care/Follow-up recommendations:  Continue activity as tolerated. Continue diet as recommended by your PCP. Ensure to keep all appointments with outpatient providers.   Disposition: Discharge home to mother  Maryfrances Bunnell, FNP 04/30/2020, 8:41 AM

## 2020-04-30 NOTE — ED Notes (Signed)
Pt A&O x 4, presents with auditory hallucinations and visual hallucinations.  Denies SI or HI.  Pt calm & cooperative.  Skin search completed, monitoring for safety.  No acute distress noted.

## 2020-04-30 NOTE — Discharge Instructions (Signed)

## 2020-04-30 NOTE — ED Notes (Signed)
Patient given breakfast; cereal, muffin

## 2020-04-30 NOTE — ED Notes (Signed)
Pt discharged home after instructions discussed. Pt stated understanding. Safe transport contacted. Personal belongings removed from locker #3 and given to Pt to review the bag prior to being escorted to [facility]. Safe transport driver given personal belongings bag and Pt provided care to get into the backseat of the car per staff. Pt mother called per request when Pt left the facility at 0945. PO meds admin prior to leaving. Safety maintained.

## 2020-04-30 NOTE — BH Assessment (Addendum)
Comprehensive Clinical Assessment (CCA) Note  04/30/2020 Brandon [name] [id number]  Brandon [name] is a 29 year old male who presents voluntary and unaccompanied to [facility]. Pt was a poor historian during the assessment. Clinician asked the pt, "what brought you to the hospital?" Pt reported, "hearing voices and seeing things." Pt reported, seeing people who are ancient, when the sun shifts. Pt reported, seeing the events of Passover in the bible. Pt reported, hearing his name called and hearing someone telling him to hurt himself/others but he does not listen. Pt reported, "I want to start listening to them." Pt reported, he does not remember if he has access to weapons. Pt denies, SI, HI, AVH, self-injurious behaviors.   Pt's mother/legal guardian Brandon [name] [place], [phone]), expressed the pt has not slept in a few days. Per mother, she and the pt met with Dr. Toy Care this past Friday,the pt was prescribed medications for sleep. Per mother, the sleep medication is not covered by Medicaid; so she call the pharmacy and left a message for Dr. Toy Care to prescribed another medication for sleep. Pt's mother reported, the pt has been taking 10 mg of Melatonin but its not working. Per mother, the pt told her he needs assistance because he was going to open up his brain to get the voices out. Pt's mother reported, she observed the pt tap on his head but she showed him her head, that their heads are the same and he can not open up his brain. Per mother, the pt has been talking to himself all day, hitting walls, bricks but not himself. Per mother, the pt would tell the voices, "get out of my head, leave me alone." Pt's mother reported, the wanted help so she called 49. Pt mother reported, the pt does not have access to weapons, she has the knives put away and when she cooks she keeps them with her. Pt's mother reported, she does not feel the pt will be safe coming back home tonight.   Pt reported, smoking three  cigarettes daily. Pt had a previous inpatient admission at [facility] on 04/05/2020.  Pt presents quiet, awake with soft speech. Pt's mood was anxious. Pt's affect was flat. Pt's thought content was suspicious. Pt was oriented x2. Pt's insight is lacking. Pt's judgement is impaired. Pt reported, if discharged from [facility] he can contract for safety.   Disposition: Brandon [name], PMHNP recommends pt to be admitted to [facility] Continuing Observation Unit.   Diagnosis: Disorganized schizophrenia ([place])   Visit Diagnosis:   No diagnosis found.    CCA Screening, Triage and Referral (STR)  Patient Reported Information How did you hear about [place]? Other (Comment) ([employer].)  Referral name: [facility] Police Department  Referral phone number: 911   Whom do you see for routine medical problems? Primary Care  Practice/Facility Name: [facility]  Practice/Facility Phone Number: No data recorded Name of Contact: No data recorded Contact Number: No data recorded Contact Fax Number: No data recorded Prescriber Name: No data recorded Prescriber Address (if known): No data recorded  What Is the Reason for Your Visit/Call Today? refills of medication  How Long Has This Been Causing You Problems? 1 wk - 1 month  What Do You Feel Would Help You the Most Today? Assessment Only;Medication   Have You Recently Been in Any Inpatient Treatment (Hospital/Detox/Crisis Center/28-Day Program)? No  Name/Location of Program/Hospital:No data recorded How Long Were You There? No data recorded When Were You Discharged? No data recorded  Have You  Ever Received Services From [facility] Before? Yes  Who Do You See at [facility]? Pt was seen at [facility] on 04/19/2020.   Have You Recently Had Any Thoughts About Hurting Yourself? No (Pt denies.)  Are You Planning to Commit Suicide/Harm Yourself At This time? No (Pt denies.)   Have you Recently Had Thoughts About [place]? No (Pt denies.)  Explanation: No data recorded  Have You Used Any Alcohol or Drugs in the Past 24 Hours? No (Pt reported, smoking three cigarettes, daily.)  How Long Ago Did You Use Drugs or Alcohol? No data recorded What Did You Use and How Much? No data recorded  Do You Currently Have a Therapist/Psychiatrist? Yes  Name of Therapist/Psychiatrist: Dr. Toy Care for medicaition management.   Have You Been Recently Discharged From Any Office Practice or Programs? No  Explanation of Discharge From Practice/Program: Graduated [facility]     CCA Screening Triage Referral Assessment Type of Contact: Face-to-Face  Is this Initial or Reassessment? No data recorded Date Telepsych consult ordered in [facility]:  No data recorded Time Telepsych consult ordered in [facility]:  No data recorded  Patient Reported Information Reviewed? Yes  Patient Left Without Being Seen? No data recorded Reason for Not Completing Assessment: No data recorded  Collateral Involvement: Brandon [name], mother/legal guardian, [phone].   Does Patient Have a Stage manager Guardian? No data recorded Name and Contact of Legal Guardian: No data recorded If Minor and Not Living with Parent(s), Who has Custody? No data recorded Is CPS involved or ever been involved? Never  Is APS involved or ever been involved? Never   Patient Determined To Be At Risk for Harm To Self or Others Based on Review of Patient Reported Information or Presenting Complaint? No  Method: No data recorded Availability of Means: No data recorded Intent: No data recorded Notification Required: No data recorded Additional Information for Danger to Others Potential: No data recorded Additional Comments for Danger to Others Potential: No data recorded Are There Guns or Other Weapons in Your Home? No data recorded Types of Guns/Weapons: No data recorded Are These Weapons Safely Secured?                            No data recorded Who  Could Verify You Are Able To Have These Secured: No data recorded Do You Have any Outstanding Charges, Pending Court Dates, Parole/Probation? No data recorded Contacted To Inform of Risk of Harm To Self or Others: No data recorded  Location of Assessment: [facility] Services   Does Patient Present under Involuntary Commitment? No  IVC Papers Initial File Date: No data recorded  [place] of Residence: [place]   Patient Currently Receiving the Following Services: Medication Management   Determination of Need: Urgent (48 hours)   Options For Referral: Other: Comment ([facility] Continuing Observation [facility].)    CCA Biopsychosocial  Intake/Chief Complaint:  CCA Intake With Chief Complaint CCA Part Two Date: 04/30/20 CCA Part Two Time: 0203 Chief Complaint/Presenting Problem: AVH, responding to internal stimuli and/or delusional thought content. Patient's Currently Reported Symptoms/Problems: AVH, responding to internal stimuli and/or delusional thought content. Individual's Strengths: Support system. Individual's Preferences: NA Individual's Abilities: NA Type of Services Patient Feels Are Needed: Sleep medicaition. Initial Clinical Notes/Concerns: Pt seen Dr. Toy Care on 04/26/2020.  Mental Health Symptoms Depression:  Depression: None  Mania:  Mania: Recklessness  Anxiety:   Anxiety: None  Psychosis:  Psychosis: Grossly disorganized  or catatonic behavior, Grossly disorganized speech, Duration of symptoms greater than six months, Hallucinations  Trauma:  Trauma: None  Obsessions:  Obsessions: None  Compulsions:  Compulsions: None  Inattention:  Inattention: None  Hyperactivity/Impulsivity:  Hyperactivity/Impulsivity: N/A  Oppositional/Defiant Behaviors:  Oppositional/Defiant Behaviors: N/A  Emotional Irregularity:  Emotional Irregularity: N/A  Other Mood/Personality Symptoms:      Mental Status Exam Appearance and self-care  Stature:  Stature: Average  Weight:   Weight: Overweight  Clothing:  Clothing: Neat/clean  Grooming:  Grooming: Normal  Cosmetic use:  Cosmetic Use: None  Posture/gait:  Posture/Gait: Tense  Motor activity:  Motor Activity: Restless  Sensorium  Attention:  Attention: Confused  Concentration:  Concentration: Scattered  Orientation:  Orientation: Person, Place  Recall/memory:  Recall/Memory: Defective in Immediate  Affect and Mood  Affect:  Affect: Flat  Mood:  Mood: Anxious  Relating  Eye contact:  Eye Contact: Fleeting  Facial expression:  Facial Expression: Tense  Attitude toward examiner:  Attitude Toward Examiner: Cooperative  Thought and Language  Speech flow: Speech Flow: Soft  Thought content:  Thought Content: Suspicious  Preoccupation:  Preoccupations: None  Hallucinations:  Hallucinations: Auditory, Visual  Organization:     Transport planner of Knowledge:  Fund of Knowledge: Fair Special educational needs teacher)  Intelligence:  Intelligence: Needs investigation  Abstraction:  Abstraction: Normal Pincus Badder)  Judgement:  Judgement: Impaired  Reality Testing:  Reality Testing: Unaware  Insight:  Insight: Lacking  Decision Making:  Decision Making: Confused, Only simple  Social Functioning  Social Maturity:  Social Maturity: Impulsive  Social Judgement:  Social Judgement: Naive  Stress  Stressors:  Stressors: Other (Comment) (AVH, not getting sleep.)  Coping Ability:  Coping Ability: Normal  Skill Deficits:  Skill Deficits: Self-control, Responsibility  Supports:  Supports: Family     Religion: Religion/Spirituality Are You A Religious Person?: Yes (Pt reported, "I believe in Christ.")  Leisure/Recreation: Leisure / Recreation Do You Have Hobbies?: Yes Leisure and Hobbies: Pt reported, "I like to write."  Exercise/Diet: Exercise/Diet Do You Exercise?: Yes What Type of Exercise Do You Do?: Run/Walk How Many Times a Week Do You Exercise?: Daily Have You Gained or Lost A Significant Amount of Weight in the Past Six  Months?: No Do You Follow a Special Diet?: Yes Type of Diet: Per pt, "vegan." Do You Have Any Trouble Sleeping?: Yes Explanation of Sleeping Difficulties: Per mother, the pt has not been sleeping.   CCA Employment/Education  Employment/Work Situation: Employment / Work Situation Employment situation: On disability What is the longest time patient has a held a job?: NA Where was the patient employed at that time?: NA Has patient ever been in the [employer]?: No  Education: Education Is Patient Currently Attending School?: No Last Grade Completed: 10 Name of [place]: [employer] Did You Graduate From [employer]?: Yes Did You Attend College?: No (not assessed) Did You Attend Graduate School?: No (not assessed) Did You Have An Individualized Education Program (IIEP): Yes (Per mother, the pt was in a smal class in school.) Did You Have Any Difficulty At [facility]?: No (not assessed)   CCA Family/Childhood History  Family and Relationship History: Family history Marital status: Single Are you sexually active?:  (not assessed) What is your sexual orientation?: not assessed Has your sexual activity been affected by drugs, alcohol, medication, or emotional stress?: not assessed Does patient have children?: No (Pt reports havng childrne and not wanting to talk about them because it upsets him however, pt's mother denies the  pt has children.)  Childhood History:  Childhood History By whom was/is the patient raised?: Mother Additional childhood history information: NA Description of patient's relationship with caregiver when they were a child: NA Patient's description of current relationship with people who raised him/her: NA How were you disciplined when you got in trouble as a child/adolescent?: NA Does patient have siblings?: No Did patient suffer any verbal/emotional/physical/sexual abuse as a child?: No Has patient ever been sexually abused/assaulted/raped as an  adolescent or adult?: No Was the patient ever a victim of a crime or a disaster?: No Witnessed domestic violence?: No  Child/Adolescent Assessment:     CCA Substance Use  Alcohol/Drug Use: Alcohol / Drug Use Pain Medications: See MAR Prescriptions: See MAR Over the Counter: See MAR History of alcohol / drug use?: No history of alcohol / drug abuse Longest period of sobriety (when/how long): unknown Negative Consequences of Use:  (Denies) Withdrawal Symptoms:  (Denies)    ASAM's:  Six Dimensions of Multidimensional Assessment  Dimension 1:  Acute Intoxication and/or Withdrawal Potential:      Dimension 2:  Biomedical Conditions and Complications:      Dimension 3:  Emotional, Behavioral, or Cognitive Conditions and Complications:     Dimension 4:  Readiness to Change:     Dimension 5:  Relapse, Continued use, or Continued Problem Potential:     Dimension 6:  Recovery/Living Environment:     ASAM Severity Score:    ASAM Recommended Level of Treatment:     Substance use Disorder (SUD)    Recommendations for Services/Supports/Treatments: Recommendations for Services/Supports/Treatments Recommendations For Services/Supports/Treatments: Other (Comment) ([facility] Continuing Observation.)  DSM5 Diagnoses: Patient Active Problem List   Diagnosis Date Noted  . Unspecified intellectual disabilities 01/27/2020  . Migraine headache 04/18/2019  . Generalized weakness   . Pulmonary embolism (HCC) 08/12/2017  . Hyperglycemia 07/31/2017  . Multiple sclerosis exacerbation (HCC) 07/30/2017  . Acute encephalopathy 07/30/2017  . Disorganized schizophrenia (HCC)   . Hallucinations 08/05/2014  . Auditory hallucination   . Left-sided weakness 01/18/2014  . Multiple sclerosis (HCC) 05/20/2013  . 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 Abnormality of gait 11/23/2012  . HTN (hypertension) 07/04/2011  . 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 Obesity, Class III, BMI 40-49.9 (morbid  obesity) (HCC) 01/14/2010  . ADHD 01/14/2010    Referrals to Alternative Service(s): Referred to Alternative Service(s):   Place:   Date:   Time:    Referred to Alternative Service(s):   Place:   Date:   Time:    Referred to Alternative Service(s):   Place:   Date:   Time:    Referred to Alternative Service(s):   Place:   Date:   Time:     Redmond Pulling  Comprehensive Clinical Assessment (CCA) Screening, Triage and Referral Note  04/30/2020 Brandon [name] [id number]  Visit Diagnosis: No diagnosis found.  Patient Reported Information How did you hear about [place]? Other (Comment) ([employer].)   Referral name: [facility] Police Department   Referral phone number: 911  Whom do you see for routine medical problems? Primary Care   Practice/Facility Name: [facility]   Practice/Facility Phone Number: No data recorded  Name of Contact: No data recorded  Contact Number: No data recorded  Contact Fax Number: No data recorded  Prescriber Name: No data recorded  Prescriber Address (if known): No data recorded What Is the Reason for Your Visit/Call Today? refills of medication  How Long Has This Been Causing You Problems?  1 wk - 1 month  Have You Recently Been in Any Inpatient Treatment (Hospital/Detox/Crisis Center/28-Day Program)? No   Name/Location of Program/Hospital:No data recorded  How Long Were You There? No data recorded  When Were You Discharged? No data recorded Have You Ever Received Services From [facility] Before? Yes   Who Do You See at [facility]? Pt was seen at [facility] on 04/19/2020.  Have You Recently Had Any Thoughts About Hurting Yourself? No (Pt denies.)   Are You Planning to Commit Suicide/Harm Yourself At This time?  No (Pt denies.)  Have you Recently Had Thoughts About [place]? No (Pt denies.)   Explanation: No data recorded Have You Used Any Alcohol or Drugs in the Past 24 Hours? No (Pt reported, smoking three  cigarettes, daily.)   How Long Ago Did You Use Drugs or Alcohol?  No data recorded  What Did You Use and How Much? No data recorded What Do You Feel Would Help You the Most Today? Assessment Only;Medication  Do You Currently Have a Therapist/Psychiatrist? Yes   Name of Therapist/Psychiatrist: Dr. Toy Care for medicaition management.   Have You Been Recently Discharged From Any Office Practice or Programs? No   Explanation of Discharge From Practice/Program:  Graduated [facility] ACTT     CCA Screening Triage Referral Assessment Type of Contact: Face-to-Face   Is this Initial or Reassessment? No data recorded  Date Telepsych consult ordered in [facility]:  No data recorded  Time Telepsych consult ordered in [facility]:  No data recorded Patient Reported Information Reviewed? Yes   Patient Left Without Being Seen? No data recorded  Reason for Not Completing Assessment: No data recorded Collateral Involvement: Jailan Trimm, mother/legal guardian, [phone].  Does Patient Have a Stage manager Guardian? No data recorded  Name and Contact of Legal Guardian:  No data recorded If Minor and Not Living with Parent(s), Who has Custody? No data recorded Is CPS involved or ever been involved? Never  Is APS involved or ever been involved? Never  Patient Determined To Be At Risk for Harm To Self or Others Based on Review of Patient Reported Information or Presenting Complaint? No   Method: No data recorded  Availability of Means: No data recorded  Intent: No data recorded  Notification Required: No data recorded  Additional Information for Danger to Others Potential:  No data recorded  Additional Comments for Danger to Others Potential:  No data recorded  Are There Guns or Other Weapons in Your Home?  No data recorded   Types of Guns/Weapons: No data recorded   Are These Weapons Safely Secured?                              No data recorded   Who Could Verify You Are Able To Have These Secured:     No data recorded Do You Have any Outstanding Charges, Pending Court Dates, Parole/Probation? No data recorded Contacted To Inform of Risk of Harm To Self or Others: No data recorded Location of Assessment: [facility] Assessment Services  Does Patient Present under Involuntary Commitment? No   IVC Papers Initial File Date: No data recorded  [place] of Residence: [place]  Patient Currently Receiving the Following Services: Medication Management   Determination of Need: Urgent (48 hours)   Options For Referral: Other: Comment [facility] Continuing Observation Unit.)   Vertell Novak, [facility]    Vertell Novak, MS, [facility], [facility] Triage Specialist  [phone]      

## 2020-04-30 NOTE — BH Assessment (Signed)
Clinician called pt's legal guardian/mother [facility] [place], [phone]) to gather additional information. Clinician left a HIPPA compliant voice message with call back information.     Redmond Pulling, MS, [facility], [facility] Triage Specialist [phone].

## 2020-04-30 NOTE — ED Triage Notes (Signed)
Presents with complaint of auditory hallucinations. Denies SI.

## 2020-04-30 NOTE — Progress Notes (Signed)
Received Brandon [name] this morning sitting on his chair bed, he denied all of the symptoms and verbalized he feels safe to return home. He requested assistance to obtain a job, school and live on his own. He was medicated per order.

## 2020-05-01 ENCOUNTER — Ambulatory Visit (INDEPENDENT_AMBULATORY_CARE_PROVIDER_SITE_OTHER): Payer: Medicare Other | Admitting: Neurology

## 2020-05-01 ENCOUNTER — Encounter: Payer: Self-pay | Admitting: Neurology

## 2020-05-01 VITALS — BP 159/104 | HR 79 | Ht 72.0 in | Wt >= 6400 oz

## 2020-05-01 DIAGNOSIS — F201 Disorganized schizophrenia: Secondary | ICD-10-CM | POA: Diagnosis not present

## 2020-05-01 DIAGNOSIS — G35 Multiple sclerosis: Secondary | ICD-10-CM

## 2020-05-01 LAB — PROLACTIN: Prolactin: 13.6 ng/mL (ref 4.0–15.2)

## 2020-05-01 NOTE — Progress Notes (Signed)
PATIENT: Brandon [name] DOB: [birth year]  REASON FOR VISIT: follow up HISTORY FROM: patient  HISTORY OF PRESENT ILLNESS: Today 05/01/20 Brandon [name] is a 29 year old male with history of relapsing remitting multiple sclerosis and schizophrenia. He remains on Ocrevus, every 8 months versus 6 due to low IgM, appointment December 22. Is on Eliquis for history of PE. He has had several ER and [facility] visits for his schizophrenia recently. Was admitted to inpatient psychiatry unit for 2 weeks at [facility]. Medications have recently been adjusted, is on Rexulti, Restoril. He has not been sleeping, has been trying to leave the house at night, is not violent. He lives with his mother, who is in a wheelchair. Feels MS has been overall stable, denies any numbness or weakness to the arms or legs, no falls. He never got his AFO brace, sometimes the left leg gets hung. He is hearing voices and talking to the voices during today's visit. Presents today for evaluation accompanied by his mother.  HISTORY 11/03/2019 SS: Brandon [name] is a 29 year old male with history of relapsing remitting multiple sclerosis and history of schizophrenia.  He remains on Ocrevus. He is on Topamax for headaches, but no longer complains of these.  MRI of the brain in November 2020 was stable when compared to prior in July 2019. He likes Ocrevus, feels it is working well.  Denies any recent falls.  He went to see his eye doctor, was told he has cataracts.  He denies any numbness or weakness in his arms or legs, changes to the bowels or bladder, or changes to the walking.  He never got the left AFO brace, when he walks sometimes feels that his left leg is hung.  He is not very active, but his mom encourages him to walk laps around the house 25 in the morning, and again in the evening.  He does have some constipation, takes a stool softener.  He remains on Eliquis for history of PE.  He received his Ocrevus infusion 2 days ago.  He presents today  accompanied by his mother.   REVIEW OF SYSTEMS: Out of a complete 14 system review of symptoms, the patient complains only of the following symptoms, and all other reviewed systems are negative.  Hearing voices  ALLERGIES: No Known Allergies  HOME MEDICATIONS: Outpatient Medications Prior to Visit  Medication Sig Dispense Refill  . amLODipine (NORVASC) 10 MG tablet Take 10 mg by mouth daily.    Marland Kitchen apixaban (ELIQUIS) 5 MG TABS tablet Take 5 mg by mouth 2 (two) times daily.     . brexpiprazole (REXULTI) 1 MG TABS tablet Start with 1 mg daily for 3 days then titrate to 2 mg daily for 7 days then take 3 mg daily 30 tablet 0  . Calcium Citrate-Vitamin D (CALCIUM + D PO) Take 1 tablet by mouth daily.    Marland Kitchen docusate sodium (COLACE) 100 MG capsule Take 400 mg by mouth at bedtime.     . ferrous gluconate (IRON 27) 240 (27 FE) MG tablet Take 240 mg by mouth daily.    . Melatonin 10 MG TABS Take 10 mg by mouth at bedtime. 30 tablet 0  . metFORMIN (GLUCOPHAGE-XR) 500 MG 24 hr tablet Take 500 mg by mouth daily with breakfast.    . Multiple Vitamin (MULTIVITAMIN WITH MINERALS) TABS tablet Take 1 tablet by mouth daily.    Marland Kitchen ocrelizumab in sodium chloride 0.9 % 500 mL CHANGE ::  Ocrevus 600mg  IV EVERY 8 (  EIGHT) months. 1 each 2  . hydrOXYzine (ATARAX/VISTARIL) 25 MG tablet Take 1 tablet (25 mg total) by mouth 3 (three) times daily as needed for anxiety. 30 tablet 0  . latanoprost (XALATAN) 0.005 % ophthalmic solution Place 1 drop into both eyes at bedtime.    . temazepam (RESTORIL) 30 MG capsule Take 1 capsule (30 mg total) by mouth at bedtime as needed for sleep. 30 capsule 0   No facility-administered medications prior to visit.    PAST MEDICAL HISTORY: Past Medical History:  Diagnosis Date  . ADHD (attention deficit hyperactivity disorder)   . Bipolar 1 disorder (HCC)   . Chronic back pain   . Chronic constipation   . Chronic neck pain   . Hypertension   . Multiple sclerosis (HCC) 05/20/2013    left sided weakness, dysarthria  . Non-compliance   . Obesity   . Pulmonary embolism (HCC)   . Schizophrenia (HCC)   . Stroke [facility])    left sided deficits - pt's mother denies this    PAST SURGICAL HISTORY: Past Surgical History:  Procedure Laterality Date  . NO PAST SURGERIES    . None    . TOOTH EXTRACTION N/A 06/24/2019   Procedure: DENTAL RESTORATION/EXTRACTION OF TEETH NUMBER ONE, SIXTEEN, SEVENTEEN, NINETEEN, THIRTY-TWO;  Surgeon: Ocie Doyne, DDS;  Location: [facility] OR;  Service: Oral Surgery;  Laterality: N/A;    FAMILY HISTORY: Family History  Problem Relation Age of Onset  . Diabetes Mother   . ADD / ADHD Brother     SOCIAL HISTORY: Social History   Socioeconomic History  . Marital status: Single    Spouse name: Not on file  . Number of children: 0  . Years of education: 11th  . Highest education level: Not on file  Occupational History    Employer: [employer]    Comment: Disbaled  Tobacco Use  . Smoking status: Current Every Day Smoker    Packs/day: 0.25    Types: Cigarettes  . Smokeless tobacco: Never Used  . Tobacco comment: 3 cigarettes a day  Vaping Use  . Vaping Use: Never used  Substance and Sexual Activity  . Alcohol use: Not Currently    Alcohol/week: 0.0 standard drinks    Comment: "A little bit"   . Drug use: Not Currently    Types: Marijuana    Comment: Last used: unknown   . Sexual activity: Not on file  Other Topics Concern  . Not on file  Social History Narrative   Patient lives at home with his mother.   Disabled.   Education 11 th grade .   Right handed.   Caffeine - one cup daily soda.   Social Determinants of Health   Financial Resource Strain:   . Difficulty of Paying Living Expenses: Not on file  Food Insecurity:   . Worried About Programme researcher, broadcasting/film/video in the Last Year: Not on file  . Ran Out of Food in the Last Year: Not on file  Transportation Needs:   . Lack of Transportation (Medical): Not on file  . Lack of  Transportation (Non-Medical): Not on file  Physical Activity:   . Days of Exercise per Week: Not on file  . Minutes of Exercise per Session: Not on file  Stress:   . Feeling of Stress : Not on file  Social Connections:   . Frequency of Communication with Friends and Family: Not on file  . Frequency of Social Gatherings with Friends and Family: Not  on file  . Attends Religious Services: Not on file  . Active Member of Clubs or Organizations: Not on file  . Attends Banker Meetings: Not on file  . Marital Status: Not on file  Intimate Partner Violence:   . Fear of Current or Ex-Partner: Not on file  . Emotionally Abused: Not on file  . Physically Abused: Not on file  . Sexually Abused: Not on file   PHYSICAL EXAM  Vitals:   05/01/20 1242  BP: (!) 159/104  Pulse: 79  Weight: (!) 408 lb (185.1 kg)  Height: 6' (1.829 m)   Body mass index is 55.33 kg/m.  Generalized: Well developed, in no acute distress   Neurological examination  Mentation: Alert oriented to time, place, history is mostly provided his mother. Follows all commands, speech is somewhat muffled, but appears interested Cranial nerve II-XII: Pupils were equal round reactive to light. Extraocular movements were full, visual field were full on confrontational test. Facial sensation and strength were normal. Head turning and shoulder shrug were normal and symmetric. Motor: The motor testing reveals 5 over 5 strength of all 4 extremities. Good symmetric motor tone is noted throughout.  Sensory: Sensory testing is intact to soft touch on all 4 extremities. No evidence of extinction is noted.  Coordination: Cerebellar testing reveals good finger-nose-finger and heel-to-shin bilaterally.  Gait and station: Gait is wide-based, steady, unsteady but having to hold his pants.  Reflexes: Deep tendon reflexes are symmetric but increased throughout  DIAGNOSTIC DATA (LABS, IMAGING, TESTING) - I reviewed patient records,  labs, notes, testing and imaging myself where available.  Lab Results  Component Value Date   WBC 7.2 04/30/2020   HGB 11.9 (L) 04/30/2020   HCT 39.0 04/30/2020   MCV 83.2 04/30/2020   PLT 201 04/30/2020      Component Value Date/Time   NA 139 04/30/2020 0216   NA 147 (H) 11/03/2019 1534   K 3.6 04/30/2020 0216   CL 106 04/30/2020 0216   CO2 23 04/30/2020 0216   GLUCOSE 88 04/30/2020 0216   BUN 15 04/30/2020 0216   BUN 18 11/03/2019 1534   CREATININE 0.92 04/30/2020 0216   CALCIUM 9.5 04/30/2020 0216   PROT 6.4 (L) 04/30/2020 0216   PROT 6.2 11/03/2019 1534   ALBUMIN 3.9 04/30/2020 0216   ALBUMIN 4.2 11/03/2019 1534   AST 16 04/30/2020 0216   ALT 37 04/30/2020 0216   ALKPHOS 69 04/30/2020 0216   BILITOT 0.7 04/30/2020 0216   BILITOT <0.2 11/03/2019 1534   GFRNONAA >60 04/30/2020 0216   GFRAA >60 04/04/2020 2158   Lab Results  Component Value Date   CHOL 141 04/30/2020   HDL 37 (L) 04/30/2020   LDLCALC 77 04/30/2020   TRIG 134 04/30/2020   CHOLHDL 3.8 04/30/2020   Lab Results  Component Value Date   HGBA1C 5.9 (H) 04/30/2020   Lab Results  Component Value Date   VITAMINB12 696 07/03/2011   Lab Results  Component Value Date   TSH 4.628 (H) 04/30/2020   ASSESSMENT AND PLAN 29 y.o. year old male  has a past medical history of ADHD (attention deficit hyperactivity disorder), Bipolar 1 disorder (HCC), Chronic back pain, Chronic constipation, Chronic neck pain, Hypertension, Multiple sclerosis (HCC) (05/20/2013), Non-compliance, Obesity, Pulmonary embolism (HCC), Schizophrenia (HCC), and Stroke (HCC). here with:  1. Relapsing remitting multiple sclerosis 2. Gait disturbance 3. Migraine headache-reportedly resolved 4. Schizophrenia -Continue close follow-up with psychiatry for schizophrenia exacerbation -MS seems overall stable -Continue Ocrevus,  on every 33-month schedule due to low IgM, due Dec 22 -Consider check MRI of the brain at next visit (last in Nov  2020, stable) -Recent CBC, CMP showed no significant abnormalities -Follow-up in 6 months or sooner if needed  I spent 30 minutes of face-to-face and non-face-to-face time with patient.  This included previsit chart review, lab review, study review, order entry, electronic health record documentation, patient education.  Margie Ege, AGNP-C, DNP 05/01/2020, 1:39 PM [facility] [address], [address] [place], [place] [address] [phone]

## 2020-05-01 NOTE — Progress Notes (Signed)
I have read the note, and I agree with the clinical assessment and plan.  Caid Radin K Faheem Ziemann   

## 2020-05-01 NOTE — Patient Instructions (Signed)
Keep Ocrevus appointment in December  Consider checking MRI at next visit Continue seeing your psychiatrist  See you back in 6 months

## 2020-05-02 ENCOUNTER — Ambulatory Visit (HOSPITAL_COMMUNITY): Payer: Medicare Other

## 2020-05-03 ENCOUNTER — Other Ambulatory Visit: Payer: Self-pay

## 2020-05-03 ENCOUNTER — Ambulatory Visit (HOSPITAL_COMMUNITY)
Admission: EM | Admit: 2020-05-03 | Discharge: 2020-05-04 | Disposition: A | Discharge status: Home / Self Care | Payer: Medicare Other | Attending: Psychiatry | Admitting: Psychiatry

## 2020-05-03 ENCOUNTER — Encounter (HOSPITAL_COMMUNITY): Payer: Self-pay | Admitting: Emergency Medicine

## 2020-05-03 DIAGNOSIS — F909 Attention-deficit hyperactivity disorder, unspecified type: Secondary | ICD-10-CM | POA: Diagnosis not present

## 2020-05-03 DIAGNOSIS — F201 Disorganized schizophrenia: Secondary | ICD-10-CM | POA: Diagnosis not present

## 2020-05-03 DIAGNOSIS — Z20822 Contact with and (suspected) exposure to covid-19: Secondary | ICD-10-CM | POA: Insufficient documentation

## 2020-05-03 DIAGNOSIS — F79 Unspecified intellectual disabilities: Secondary | ICD-10-CM | POA: Insufficient documentation

## 2020-05-03 DIAGNOSIS — F319 Bipolar disorder, unspecified: Secondary | ICD-10-CM | POA: Diagnosis not present

## 2020-05-03 DIAGNOSIS — Z79899 Other long term (current) drug therapy: Secondary | ICD-10-CM | POA: Insufficient documentation

## 2020-05-03 DIAGNOSIS — F250 Schizoaffective disorder, bipolar type: Secondary | ICD-10-CM

## 2020-05-03 DIAGNOSIS — F209 Schizophrenia, unspecified: Secondary | ICD-10-CM | POA: Diagnosis not present

## 2020-05-03 DIAGNOSIS — F17210 Nicotine dependence, cigarettes, uncomplicated: Secondary | ICD-10-CM | POA: Insufficient documentation

## 2020-05-03 LAB — RESPIRATORY PANEL BY RT PCR (FLU A&B, COVID)
Influenza A by PCR: NEGATIVE
Influenza B by PCR: NEGATIVE
SARS Coronavirus 2 by RT PCR: NEGATIVE

## 2020-05-03 LAB — POC SARS CORONAVIRUS 2 AG -  ED: SARS Coronavirus 2 Ag: NEGATIVE

## 2020-05-03 LAB — POCT URINE DRUG SCREEN - MANUAL ENTRY (I-SCREEN)
POC Amphetamine UR: NOT DETECTED
POC Buprenorphine (BUP): NOT DETECTED
POC Cocaine UR: NOT DETECTED
POC Marijuana UR: NOT DETECTED
POC Methadone UR: NOT DETECTED
POC Methamphetamine UR: NOT DETECTED
POC Morphine: NOT DETECTED
POC Oxazepam (BZO): POSITIVE — AB
POC Oxycodone UR: NOT DETECTED
POC Secobarbital (BAR): NOT DETECTED

## 2020-05-03 MED ORDER — ACETAMINOPHEN 325 MG PO TABS: 650.0000 mg | ORAL_TABLET | Freq: Four times a day (QID) | ORAL | Status: DC | PRN

## 2020-05-03 MED ORDER — METFORMIN HCL ER 500 MG PO TB24
500.0000 mg | ORAL_TABLET | Freq: Every day | ORAL | Status: DC
  Administered 2020-05-04: 500 mg via ORAL
  Filled 2020-05-03: qty 1

## 2020-05-03 MED ORDER — MAGNESIUM HYDROXIDE 400 MG/5ML PO SUSP: 30.0000 mL | Freq: Every day | ORAL | Status: DC | PRN

## 2020-05-03 MED ORDER — APIXABAN 5 MG PO TABS
5.0000 mg | ORAL_TABLET | Freq: Two times a day (BID) | ORAL | Status: DC
  Administered 2020-05-03 – 2020-05-04 (×3): 5 mg via ORAL
  Filled 2020-05-03 (×3): qty 1

## 2020-05-03 MED ORDER — AMLODIPINE BESYLATE 10 MG PO TABS
10.0000 mg | ORAL_TABLET | Freq: Every day | ORAL | Status: DC
  Administered 2020-05-04: 10 mg via ORAL
  Filled 2020-05-03: qty 1

## 2020-05-03 MED ORDER — DIPHENHYDRAMINE HCL 50 MG/ML IJ SOLN
50.0000 mg | Freq: Once | INTRAMUSCULAR | Status: AC
  Administered 2020-05-03: 50 mg via INTRAMUSCULAR
  Filled 2020-05-03: qty 1

## 2020-05-03 MED ORDER — LORAZEPAM 2 MG/ML IJ SOLN: 2.0000 mg | Freq: Once | INTRAMUSCULAR | Status: DC

## 2020-05-03 MED ORDER — HALOPERIDOL LACTATE 5 MG/ML IJ SOLN
10.0000 mg | Freq: Once | INTRAMUSCULAR | Status: AC
  Administered 2020-05-03: 10 mg via INTRAMUSCULAR
  Filled 2020-05-03: qty 2

## 2020-05-03 MED ORDER — LORAZEPAM 2 MG/ML IJ SOLN
2.0000 mg | Freq: Once | INTRAMUSCULAR | Status: AC
  Administered 2020-05-03: 2 mg via INTRAMUSCULAR
  Filled 2020-05-03: qty 1

## 2020-05-03 MED ORDER — ALUM & MAG HYDROXIDE-SIMETH 200-200-20 MG/5ML PO SUSP: 30.0000 mL | ORAL | Status: DC | PRN

## 2020-05-03 MED ORDER — TEMAZEPAM 30 MG PO CAPS
30.0000 mg | ORAL_CAPSULE | Freq: Every day | ORAL | Status: DC
  Administered 2020-05-03: 30 mg via ORAL
  Filled 2020-05-03: qty 1

## 2020-05-03 NOTE — ED Notes (Addendum)
Patient is having auditory hallucination. Patient refuse vitals.

## 2020-05-03 NOTE — ED Provider Notes (Signed)
Behavioral Health Admission H&P [facility] & OBS)  Date: 05/03/20 Patient Name: Brandon [name] MRN: [id number] Chief Complaint:  Chief Complaint  Patient presents with  . Schizophrenia   Chief Complaint/Presenting Problem: AVH, responding to internal stimuli and/or delusional thought content. Patient's mood is labile.  Diagnoses:  Final diagnoses:  None    HPI: Patient is a 29 year old male that presented to the [facility] via law enforcement voluntarily.  Patient has a known history of schizophrenia.  Patient is well-known to this provider.  Patient does have a documented history of unspecified intellectual disabilities and schizophrenia along with medical diagnoses of high blood pressure and DVTs.  Patient presents with law enforcement actively hallucinating and talking to the voices in his head.  Patient is agitated and becomes easily angry.  Patient has moments of yelling out and cursing.  His speech and thought process is very disorganized and illogical.  Patient is agreeable to medications and I have ordered Haldol 10 mg IM once, Ativan 2 mg IM once, and Benadryl 50 mg IM once.  At this time patient meets inpatient criteria and will requests social work to have patient faxed out. Contacted patient's mother also patient legal guardian.  She reports this morning that the patient came into her room after he woke up and was actively hallucinating and cursing and yelling.  She states that she had to tell him to get out of her room.  She states that he has had a lot of agitation today and she states that just started today that she has noticed.  She states that she has been compliant with his medications and that she has noticed that he has been decompensating some over the past few days but has not been this bad yet.  She states that she feels that he is at a point that he needs to be admitted to the hospital again.  She requests updates when patient will be admitted to the hospital.  [place] 2-9:     ED  from 04/03/2020 in [place] No Risk       Total Time spent with patient: 30 minutes  Musculoskeletal  Strength & Muscle Tone: within normal limits Gait & Station: normal Patient leans: N/A  Psychiatric Specialty Exam  Presentation General Appearance: Disheveled;Bizarre  Eye Contact:Fleeting  Speech:Clear and Coherent  Speech Volume:Increased  Handedness:Right   Mood and Affect  Mood:Irritable;Angry  Affect:Congruent   Thought Process  Thought Processes:Disorganized;Irrevelant  Descriptions of Associations:Circumstantial  Orientation:Partial  Thought Content:Illogical;Paranoid Ideation;Rumination;Tangential;Scattered  Hallucinations:Hallucinations: Auditory  Ideas of Reference:Paranoia  Suicidal Thoughts:Suicidal Thoughts: No  Homicidal Thoughts:Homicidal Thoughts: No   Sensorium  Memory:Immediate Poor;Recent Poor;Remote Poor  Judgment:Impaired  Insight:Lacking   Executive Functions  Concentration:Poor  Attention Span:Poor  Recall:Good  Fund of Knowledge:Poor  Language:Fair   Psychomotor Activity  Psychomotor Activity:Psychomotor Activity: Increased   Assets  Assets:Financial Resources/Insurance;Housing;Resilience;Social Support   Sleep  Sleep:Sleep: Poor   Physical Exam ROS  There were no vitals taken for this visit. There is no height or weight on file to calculate BMI.  Past Psychiatric History: Paranoid schizophrenia, numerous hospitalizations   Is the patient at risk to self? Yes  Has the patient been a risk to self in the past 6 months? Yes .    Has the patient been a risk to self within the distant past? Yes   Is the patient a risk to others? Yes   Has the patient been a risk to others in  the past 6 months? No   Has the patient been a risk to others within the distant past? Yes   Past Medical History:  Past Medical History:  Diagnosis Date  . ADHD (attention  deficit hyperactivity disorder)   . Bipolar 1 disorder ([place])   . Chronic back pain   . Chronic constipation   . Chronic neck pain   . Hypertension   . Multiple sclerosis ([place]) 05/20/2013   left sided weakness, dysarthria  . Non-compliance   . Obesity   . Pulmonary embolism ([place])   . Schizophrenia ([place])   . Stroke [facility])    left sided deficits - pt's mother denies this    Past Surgical History:  Procedure Laterality Date  . NO PAST SURGERIES    . None    . TOOTH EXTRACTION N/A 06/24/2019   Procedure: DENTAL RESTORATION/EXTRACTION OF TEETH NUMBER ONE, SIXTEEN, SEVENTEEN, NINETEEN, THIRTY-TWO;  Surgeon: Diona Browner, DDS;  Location: [facility];  Service: Oral Surgery;  Laterality: N/A;    Family History:  Family History  Problem Relation Age of Onset  . Diabetes Mother   . ADD / ADHD Brother     Social History:  Social History   Socioeconomic History  . Marital status: Single    Spouse name: Not on file  . Number of children: 0  . Years of education: 11th  . Highest education level: Not on file  Occupational History    Employer: [employer]    Comment: Disbaled  Tobacco Use  . Smoking status: Current Every Day Smoker    Packs/day: 0.25    Types: Cigarettes  . Smokeless tobacco: Never Used  . Tobacco comment: 3 cigarettes a day  Vaping Use  . Vaping Use: Never used  Substance and Sexual Activity  . Alcohol use: Not Currently    Alcohol/week: 0.0 standard drinks    Comment: "A little bit"   . Drug use: Not Currently    Types: Marijuana    Comment: Last used: unknown   . Sexual activity: Not on file  Other Topics Concern  . Not on file  Social History Narrative   Patient lives at home with his mother.   Disabled.   Education 11 th grade .   Right handed.   Caffeine - one cup daily soda.   Social Determinants of Health   Financial Resource Strain:   . Difficulty of Paying Living Expenses: Not on file  Food Insecurity:   . Worried About Sales executive in the Last Year: Not on file  . Ran Out of Food in the Last Year: Not on file  Transportation Needs:   . Lack of Transportation (Medical): Not on file  . Lack of Transportation (Non-Medical): Not on file  Physical Activity:   . Days of Exercise per Week: Not on file  . Minutes of Exercise per Session: Not on file  Stress:   . Feeling of Stress : Not on file  Social Connections:   . Frequency of Communication with Friends and Family: Not on file  . Frequency of Social Gatherings with Friends and Family: Not on file  . Attends Religious Services: Not on file  . Active Member of Clubs or Organizations: Not on file  . Attends Archivist Meetings: Not on file  . Marital Status: Not on file  Intimate Partner Violence:   . Fear of Current or Ex-Partner: Not on file  . Emotionally Abused: Not on file  .  Physically Abused: Not on file  . Sexually Abused: Not on file    SDOH:  SDOH Screenings   Alcohol Screen:   . Last Alcohol Screening Score (AUDIT): Not on file  Depression (PHQ2-9):   . PHQ-2 Score: Not on file  Financial Resource Strain:   . Difficulty of Paying Living Expenses: Not on file  Food Insecurity:   . Worried About Charity fundraiser in the Last Year: Not on file  . Ran Out of Food in the Last Year: Not on file  Housing:   . Last Housing Risk Score: Not on file  Physical Activity:   . Days of Exercise per Week: Not on file  . Minutes of Exercise per Session: Not on file  Social Connections:   . Frequency of Communication with Friends and Family: Not on file  . Frequency of Social Gatherings with Friends and Family: Not on file  . Attends Religious Services: Not on file  . Active Member of Clubs or Organizations: Not on file  . Attends Archivist Meetings: Not on file  . Marital Status: Not on file  Stress:   . Feeling of Stress : Not on file  Tobacco Use: High Risk  . Smoking Tobacco Use: Current Every Day Smoker  . Smokeless Tobacco  Use: Never Used  Transportation Needs:   . Film/video editor (Medical): Not on file  . Lack of Transportation (Non-Medical): Not on file    Last Labs:  Admission on 04/30/2020, Discharged on 04/30/2020  Component Date Value Ref Range Status  . SARS Coronavirus 2 by RT PCR 04/30/2020 NEGATIVE  NEGATIVE Final   Comment: (NOTE) SARS-CoV-2 target nucleic acids are NOT DETECTED.  The SARS-CoV-2 RNA is generally detectable in upper respiratoy specimens during the acute phase of infection. The lowest concentration of SARS-CoV-2 viral copies this assay can detect is 131 copies/mL. A negative result does not preclude SARS-Cov-2 infection and should not be used as the sole basis for treatment or other patient management decisions. A negative result may occur with  improper specimen collection/handling, submission of specimen other than nasopharyngeal swab, presence of viral mutation(s) within the areas targeted by this assay, and inadequate number of viral copies (<131 copies/mL). A negative result must be combined with clinical observations, patient history, and epidemiological information. The expected result is Negative.  Fact Sheet for Patients:  PinkCheek.be  Fact Sheet for Healthcare Providers:  GravelBags.it  This test is no                          t yet approved or cleared by the [place] FDA and  has been authorized for detection and/or diagnosis of SARS-CoV-2 by FDA under an Emergency Use Authorization (EUA). This EUA will remain  in effect (meaning this test can be used) for the duration of the COVID-19 declaration under Section 564(b)(1) of the Act, 21 U.S.C. section 360bbb-3(b)(1), unless the authorization is terminated or revoked sooner.    . Influenza A by PCR 04/30/2020 NEGATIVE  NEGATIVE Final  . Influenza B by PCR 04/30/2020 NEGATIVE  NEGATIVE Final   Comment: (NOTE) The Xpert Xpress  SARS-CoV-2/FLU/RSV assay is intended as an aid in  the diagnosis of influenza from Nasopharyngeal swab specimens and  should not be used as a sole basis for treatment. Nasal washings and  aspirates are unacceptable for Xpert Xpress SARS-CoV-2/FLU/RSV  testing.  Fact Sheet for Patients: PinkCheek.be  Fact Sheet for Healthcare Providers:  GravelBags.it  This test is not yet approved or cleared by the [place] and  has been authorized for detection and/or diagnosis of SARS-CoV-2 by  FDA under an Emergency Use Authorization (EUA). This EUA will remain  in effect (meaning this test can be used) for the duration of the  Covid-19 declaration under Section 564(b)(1) of the Act, 21  U.S.C. section 360bbb-3(b)(1), unless the authorization is  terminated or revoked. Performed at [facility], [place] [address] [place], [place] [address]   . SARS Coronavirus 2 Ag 04/30/2020 Negative  Negative Preliminary  . WBC 04/30/2020 7.2  4.0 - 10.5 K/uL Final  . RBC 04/30/2020 4.69  4.22 - 5.81 MIL/uL Final  . Hemoglobin 04/30/2020 11.9* 13.0 - 17.0 g/dL Final  . HCT 04/30/2020 39.0  39 - 52 % Final  . MCV 04/30/2020 83.2  80.0 - 100.0 fL Final  . MCH 04/30/2020 25.4* 26.0 - 34.0 pg Final  . MCHC 04/30/2020 30.5  30.0 - 36.0 g/dL Final  . RDW 04/30/2020 14.3  11.5 - 15.5 % Final  . Platelets 04/30/2020 201  150 - 400 K/uL Final  . nRBC 04/30/2020 0.0  0.0 - 0.2 % Final  . Neutrophils Relative % 04/30/2020 57  % Final  . Neutro Abs 04/30/2020 4.2  1.7 - 7.7 K/uL Final  . Lymphocytes Relative 04/30/2020 28  % Final  . Lymphs Abs 04/30/2020 2.0  0.7 - 4.0 K/uL Final  . Monocytes Relative 04/30/2020 8  % Final  . Monocytes Absolute 04/30/2020 0.6  0.1 - 1.0 K/uL Final  . Eosinophils Relative 04/30/2020 5  % Final  . Eosinophils Absolute 04/30/2020 0.3  0.0 - 0.5 K/uL Final  . Basophils Relative 04/30/2020 1  % Final  . Basophils  Absolute 04/30/2020 0.0  0.0 - 0.1 K/uL Final  . Immature Granulocytes 04/30/2020 1  % Final  . Abs Immature Granulocytes 04/30/2020 0.04  0.00 - 0.07 K/uL Final   Performed at [facility], [place] [address], [place], [place] [address]  . Sodium 04/30/2020 139  135 - 145 mmol/L Final  . Potassium 04/30/2020 3.6  3.5 - 5.1 mmol/L Final  . Chloride 04/30/2020 106  98 - 111 mmol/L Final  . CO2 04/30/2020 23  22 - 32 mmol/L Final  . Glucose, Bld 04/30/2020 88  70 - 99 mg/dL Final   Glucose reference range applies only to samples taken after fasting for at least 8 hours.  . BUN 04/30/2020 15  6 - 20 mg/dL Final  . Creatinine, Ser 04/30/2020 0.92  0.61 - 1.24 mg/dL Final  . Calcium 04/30/2020 9.5  8.9 - 10.3 mg/dL Final  . Total Protein 04/30/2020 6.4* 6.5 - 8.1 g/dL Final  . Albumin 04/30/2020 3.9  3.5 - 5.0 g/dL Final  . AST 04/30/2020 16  15 - 41 U/L Final  . ALT 04/30/2020 37  0 - 44 U/L Final  . Alkaline Phosphatase 04/30/2020 69  38 - 126 U/L Final  . Total Bilirubin 04/30/2020 0.7  0.3 - 1.2 mg/dL Final  . GFR, Estimated 04/30/2020 >60  >60 mL/min Final   Comment: (NOTE) Calculated using the CKD-EPI Creatinine Equation (2021)   . Anion gap 04/30/2020 10  5 - 15 Final   Performed at [facility], [place] [address] [place],  [address]  . Hgb A1c MFr Bld 04/30/2020 5.9* 4.8 - 5.6 % Final   Comment: (NOTE)  Prediabetes: 5.7 - 6.4         Diabetes: >6.4         Glycemic control for adults with diabetes: <7.0   . Mean Plasma Glucose 04/30/2020 123  mg/dL Final   Comment: (NOTE) Performed At: [facility] [place], [place] [id number] Rush Farmer MD [id number]   . Magnesium 04/30/2020 1.5* 1.7 - 2.4 mg/dL Final   Performed at [facility], [place] [address], [place], [place] [address]  . Alcohol, Ethyl (B) 04/30/2020 <10  <10 mg/dL Final   Comment: (NOTE) Lowest detectable limit for serum alcohol is 10 mg/dL.  For medical  purposes only. Performed at [facility], [place] [address], [place], [place] [address]   . Cholesterol 04/30/2020 141  0 - 200 mg/dL Final  . Triglycerides 04/30/2020 134  <150 mg/dL Final  . HDL 04/30/2020 37* >40 mg/dL Final  . Total CHOL/HDL Ratio 04/30/2020 3.8  RATIO Final  . VLDL 04/30/2020 27  0 - 40 mg/dL Final  . LDL Cholesterol 04/30/2020 77  0 - 99 mg/dL Final   Comment:        Total Cholesterol/HDL:CHD Risk Coronary Heart Disease Risk Table                     Men   Women  1/2 Average Risk   3.4   3.3  Average Risk       5.0   4.4  2 X Average Risk   9.6   7.1  3 X Average Risk  23.4   11.0        Use the calculated Patient Ratio above and the CHD Risk Table to determine the patient's CHD Risk.        ATP III CLASSIFICATION (LDL):  <100     mg/dL   Optimal  100-129  mg/dL   Near or Above                    Optimal  130-159  mg/dL   Borderline  160-189  mg/dL   High  >190     mg/dL   Very High Performed at [place] [address], [place], [place] [address]   . TSH 04/30/2020 4.628* 0.350 - 4.500 uIU/mL Final   Comment: Performed by a 3rd Generation assay with a functional sensitivity of <=0.01 uIU/mL. Performed at [facility], [place] [address] [place], [place] [address]   . Prolactin 04/30/2020 13.6  4.0 - 15.2 ng/mL Final   Comment: (NOTE) Performed At: [facility] [place], [place] [id number] Rush Farmer MD [id number]   . POC Amphetamine UR 04/30/2020 None Detected  None Detected Preliminary  . POC Secobarbital (BAR) 04/30/2020 None Detected  None Detected Preliminary  . POC Buprenorphine (BUP) 04/30/2020 None Detected  None Detected Preliminary  . POC Oxazepam (BZO) 04/30/2020 None Detected  None Detected Preliminary  . POC Cocaine UR 04/30/2020 None Detected  None Detected Preliminary  . POC Methamphetamine UR 04/30/2020 None Detected  None Detected Preliminary  . POC Morphine 04/30/2020 None Detected   None Detected Preliminary  . POC Oxycodone UR 04/30/2020 None Detected  None Detected Preliminary  . POC Methadone UR 04/30/2020 None Detected  None Detected Preliminary  . POC Marijuana UR 04/30/2020 None Detected  None Detected Preliminary  . SARS Coronavirus 2 Ag 04/30/2020 NEGATIVE  NEGATIVE Final   Comment: (NOTE) SARS-CoV-2 antigen NOT DETECTED.  Negative results are presumptive.  Negative results do not preclude SARS-CoV-2 infection and should not be used as the sole basis for treatment or other patient management decisions, including infection  control decisions, particularly in the presence of clinical signs and  symptoms consistent with COVID-19, or in those who have been in contact with the virus.  Negative results must be combined with clinical observations, patient history, and epidemiological information. The expected result is Negative.  Fact Sheet for Patients: PodPark.tn  Fact Sheet for Healthcare Providers: GiftContent.is   This test is not yet approved or cleared by the [place] FDA and  has been authorized for detection and/or diagnosis of SARS-CoV-2 by FDA under an Emergency Use Authorization (EUA).  This EUA will remain in effect (meaning this test can be used) for the duration of  the C                          OVID-19 declaration under Section 564(b)(1) of the Act, 21 U.S.C. section 360bbb-3(b)(1), unless the authorization is terminated or revoked sooner.    . Glucose-Capillary 04/30/2020 89  70 - 99 mg/dL Final   Glucose reference range applies only to samples taken after fasting for at least 8 hours.  Admission on 04/04/2020, Discharged on 04/05/2020  Component Date Value Ref Range Status  . Sodium 04/04/2020 141  135 - 145 mmol/L Final  . Potassium 04/04/2020 4.1  3.5 - 5.1 mmol/L Final  . Chloride 04/04/2020 103  98 - 111 mmol/L Final  . CO2 04/04/2020 26  22 - 32 mmol/L Final  .  Glucose, Bld 04/04/2020 111* 70 - 99 mg/dL Final   Glucose reference range applies only to samples taken after fasting for at least 8 hours.  . BUN 04/04/2020 17  6 - 20 mg/dL Final  . Creatinine, Ser 04/04/2020 0.92  0.61 - 1.24 mg/dL Final  . Calcium 04/04/2020 9.4  8.9 - 10.3 mg/dL Final  . Total Protein 04/04/2020 7.1  6.5 - 8.1 g/dL Final  . Albumin 04/04/2020 4.1  3.5 - 5.0 g/dL Final  . AST 04/04/2020 13* 15 - 41 U/L Final  . ALT 04/04/2020 31  0 - 44 U/L Final  . Alkaline Phosphatase 04/04/2020 79  38 - 126 U/L Final  . Total Bilirubin 04/04/2020 0.6  0.3 - 1.2 mg/dL Final  . GFR calc non Af Amer 04/04/2020 >60  >60 mL/min Final  . GFR calc Af Amer 04/04/2020 >60  >60 mL/min Final  . Anion gap 04/04/2020 12  5 - 15 Final   Performed at [facility], [place] [address] [place], [place] [address]  . Alcohol, Ethyl (B) 04/04/2020 <10  <10 mg/dL Final   Comment: (NOTE) Lowest detectable limit for serum alcohol is 10 mg/dL.  For medical purposes only. Performed at [facility], [place] [address], [place], [place] [address]   . Opiates 04/04/2020 NONE DETECTED  NONE DETECTED Final  . Cocaine 04/04/2020 NONE DETECTED  NONE DETECTED Final  . Benzodiazepines 04/04/2020 NONE DETECTED  NONE DETECTED Final  . Amphetamines 04/04/2020 NONE DETECTED  NONE DETECTED Final  . Tetrahydrocannabinol 04/04/2020 NONE DETECTED  NONE DETECTED Final  . Barbiturates 04/04/2020 NONE DETECTED  NONE DETECTED Final   Comment: (NOTE) DRUG SCREEN FOR MEDICAL PURPOSES ONLY.  IF CONFIRMATION IS NEEDED FOR ANY PURPOSE, NOTIFY LAB WITHIN 5 DAYS.  LOWEST DETECTABLE LIMITS FOR URINE DRUG SCREEN Drug Class  Cutoff (ng/mL) Amphetamine and metabolites    1000 Barbiturate and metabolites    200 Benzodiazepine                 161 Tricyclics and metabolites     300 Opiates and metabolites        300 Cocaine and metabolites        300 THC                             50 Performed at [facility], [place] [address] [place], [place] [address]   . WBC 04/04/2020 7.7  4.0 - 10.5 K/uL Final  . RBC 04/04/2020 4.99  4.22 - 5.81 MIL/uL Final  . Hemoglobin 04/04/2020 13.0  13.0 - 17.0 g/dL Final  . HCT 04/04/2020 41.6  39 - 52 % Final  . MCV 04/04/2020 83.4  80.0 - 100.0 fL Final  . MCH 04/04/2020 26.1  26.0 - 34.0 pg Final  . MCHC 04/04/2020 31.3  30.0 - 36.0 g/dL Final  . RDW 04/04/2020 14.0  11.5 - 15.5 % Final  . Platelets 04/04/2020 173  150 - 400 K/uL Final  . nRBC 04/04/2020 0.0  0.0 - 0.2 % Final  . Neutrophils Relative % 04/04/2020 59  % Final  . Neutro Abs 04/04/2020 4.5  1.7 - 7.7 K/uL Final  . Lymphocytes Relative 04/04/2020 28  % Final  . Lymphs Abs 04/04/2020 2.1  0.7 - 4.0 K/uL Final  . Monocytes Relative 04/04/2020 10  % Final  . Monocytes Absolute 04/04/2020 0.8  0.1 - 1.0 K/uL Final  . Eosinophils Relative 04/04/2020 3  % Final  . Eosinophils Absolute 04/04/2020 0.3  0.0 - 0.5 K/uL Final  . Basophils Relative 04/04/2020 0  % Final  . Basophils Absolute 04/04/2020 0.0  0.0 - 0.1 K/uL Final  . Immature Granulocytes 04/04/2020 0  % Final  . Abs Immature Granulocytes 04/04/2020 0.03  0.00 - 0.07 K/uL Final   Performed at [facility], [place] [name], [place], [place] [address]  . Glucose-Capillary 04/05/2020 224* 70 - 99 mg/dL Final   Glucose reference range applies only to samples taken after fasting for at least 8 hours.  Admission on 04/03/2020, Discharged on 04/04/2020  Component Date Value Ref Range Status  . SARS Coronavirus 2 by RT PCR 04/03/2020 NEGATIVE  NEGATIVE Final   Comment: (NOTE) SARS-CoV-2 target nucleic acids are NOT DETECTED.  The SARS-CoV-2 RNA is generally detectable in upper respiratoy specimens during the acute phase of infection. The lowest concentration of SARS-CoV-2 viral copies this assay can detect is 131 copies/mL. A negative result does not preclude  SARS-Cov-2 infection and should not be used as the sole basis for treatment or other patient management decisions. A negative result may occur with  improper specimen collection/handling, submission of specimen other than nasopharyngeal swab, presence of viral mutation(s) within the areas targeted by this assay, and inadequate number of viral copies (<131 copies/mL). A negative result must be combined with clinical observations, patient history, and epidemiological information. The expected result is Negative.  Fact Sheet for Patients:  PinkCheek.be  Fact Sheet for Healthcare Providers:  GravelBags.it  This test is no                          t yet approved or cleared by the [place] FDA and  has been authorized for detection and/or  diagnosis of SARS-CoV-2 by FDA under an Emergency Use Authorization (EUA). This EUA will remain  in effect (meaning this test can be used) for the duration of the COVID-19 declaration under Section 564(b)(1) of the Act, 21 U.S.C. section 360bbb-3(b)(1), unless the authorization is terminated or revoked sooner.    . Influenza A by PCR 04/03/2020 NEGATIVE  NEGATIVE Final  . Influenza B by PCR 04/03/2020 NEGATIVE  NEGATIVE Final   Comment: (NOTE) The Xpert Xpress SARS-CoV-2/FLU/RSV assay is intended as an aid in  the diagnosis of influenza from Nasopharyngeal swab specimens and  should not be used as a sole basis for treatment. Nasal washings and  aspirates are unacceptable for Xpert Xpress SARS-CoV-2/FLU/RSV  testing.  Fact Sheet for Patients: PinkCheek.be  Fact Sheet for Healthcare Providers: GravelBags.it  This test is not yet approved or cleared by the [place] FDA and  has been authorized for detection and/or diagnosis of SARS-CoV-2 by  FDA under an Emergency Use Authorization (EUA). This EUA will remain  in effect  (meaning this test can be used) for the duration of the  Covid-19 declaration under Section 564(b)(1) of the Act, 21  U.S.C. section 360bbb-3(b)(1), unless the authorization is  terminated or revoked. Performed at [facility], [place] [address], [place], [place] [address]   . POC Amphetamine UR 04/03/2020 None Detected  None Detected Final  . POC Secobarbital (BAR) 04/03/2020 None Detected  None Detected Final  . POC Buprenorphine (BUP) 04/03/2020 None Detected  None Detected Final  . POC Oxazepam (BZO) 04/03/2020 None Detected  None Detected Final  . POC Cocaine UR 04/03/2020 None Detected  None Detected Final  . POC Methamphetamine UR 04/03/2020 None Detected  None Detected Final  . POC Morphine 04/03/2020 None Detected  None Detected Final  . POC Oxycodone UR 04/03/2020 None Detected  None Detected Final  . POC Methadone UR 04/03/2020 None Detected  None Detected Final  . POC Marijuana UR 04/03/2020 None Detected  None Detected Final  . SARS Coronavirus 2 Ag 04/03/2020 Negative  Negative Preliminary  Office Visit on 11/03/2019  Component Date Value Ref Range Status  . WBC 11/03/2019 7.0  3.4 - 10.8 x10E3/uL Final  . RBC 11/03/2019 4.76  4.14 - 5.80 x10E6/uL Final  . Hemoglobin 11/03/2019 12.7* 13.0 - 17.7 g/dL Final  . Hematocrit 11/03/2019 39.4  37.5 - 51.0 % Final  . MCV 11/03/2019 83  79 - 97 fL Final  . MCH 11/03/2019 26.7  26.6 - 33.0 pg Final  . MCHC 11/03/2019 32.2  31 - 35 g/dL Final  . RDW 11/03/2019 13.2  11.6 - 15.4 % Final  . Platelets 11/03/2019 195  150 - 450 x10E3/uL Final  . Neutrophils 11/03/2019 60  Not Estab. % Final  . Lymphs 11/03/2019 27  Not Estab. % Final  . Monocytes 11/03/2019 8  Not Estab. % Final  . Eos 11/03/2019 3  Not Estab. % Final  . Basos 11/03/2019 1  Not Estab. % Final  . Neutrophils Absolute 11/03/2019 4.3  1.40 - 7.00 x10E3/uL Final  . Lymphocytes Absolute 11/03/2019 1.9  0 - 3 x10E3/uL Final  . Monocytes Absolute 11/03/2019 0.6  0 - 0  x10E3/uL Final  . EOS (ABSOLUTE) 11/03/2019 0.2  0.0 - 0.4 x10E3/uL Final  . Basophils Absolute 11/03/2019 0.0  0 - 0 x10E3/uL Final  . Immature Granulocytes 11/03/2019 1  Not Estab. % Final  . Immature Grans (Abs) 11/03/2019 0.1  0.0 - 0.1 x10E3/uL Final  .  Glucose 11/03/2019 75  65 - 99 mg/dL Final  . BUN 11/03/2019 18  6 - 20 mg/dL Final  . Creatinine, Ser 11/03/2019 1.22  0.76 - 1.27 mg/dL Final  . GFR calc non Af Amer 11/03/2019 81  >59 mL/min/1.73 Final  . GFR calc Af Amer 11/03/2019 93  >59 mL/min/1.73 Final   Comment: **[facility] currently reports eGFR in compliance with the current**   recommendations of the [employer]. [facility] will   update reporting as new guidelines are published from the NKF-ASN   Task force.   . BUN/Creatinine Ratio 11/03/2019 15  9 - 20 Final  . Sodium 11/03/2019 147* 134 - 144 mmol/L Final  . Potassium 11/03/2019 4.4  3.5 - 5.2 mmol/L Final  . Chloride 11/03/2019 107* 96 - 106 mmol/L Final  . CO2 11/03/2019 24  20 - 29 mmol/L Final  . Calcium 11/03/2019 9.3  8.7 - 10.2 mg/dL Final  . Total Protein 11/03/2019 6.2  6.0 - 8.5 g/dL Final  . Albumin 11/03/2019 4.2  4.1 - 5.2 g/dL Final  . Globulin, Total 11/03/2019 2.0  1.5 - 4.5 g/dL Final  . Albumin/Globulin Ratio 11/03/2019 2.1  1.2 - 2.2 Final  . Bilirubin Total 11/03/2019 <0.2  0.0 - 1.2 mg/dL Final  . Alkaline Phosphatase 11/03/2019 93  39 - 117 IU/L Final  . AST 11/03/2019 15  0 - 40 IU/L Final  . ALT 11/03/2019 37  0 - 44 IU/L Final  . IgG (Immunoglobin G), Serum 11/03/2019 834  603 - 1,613 mg/dL Final  . IgA/Immunoglobulin A, Serum 11/03/2019 90  90 - 386 mg/dL Final  . IgM (Immunoglobulin M), Srm 11/03/2019 14* 20 - 172 mg/dL Final   Result confirmed on concentration.    Allergies: Patient has no known allergies.  PTA Medications: (Not in a hospital admission)   Medical Decision Making  Labs and EKG done on 04/30/20 Will need new covid swab - ordered Restart home  medications except for Rexulti    Recommendations  Based on my evaluation the patient does not appear to have an emergency medical condition.  [place], FNP 05/03/20  3:16 PM

## 2020-05-03 NOTE — ED Notes (Signed)
Pt screaming out, responding to internal stimuli.  Pt disturbing the millieu.  NP Nira Conn notified.  Pending meds.

## 2020-05-03 NOTE — BH Assessment (Signed)
[facility] Comprehensive Clinical Assessment  05/03/2020 Brandon [name] [id number]   Patient presents to the [facility] today with his mother acutely psychotic and disorganized.  He is loud and his mood is labile.  He has been carrying on conversations with people who are not there and screaming and posturing like he sees tham and going to try to hurt them.  At one point, patient turned towards the male tech and was posturing towards her like he was going to attack her.  Staff was able to calm him down for a brief minute, but then he amphed back up.  Due to his volatile behaviors, his psychosis and mood lability, patient had to be chemically sedated to keep him from harming himself or others.  Patient was just seen in at the [facility] three days ago.  04/30/2020 Note by Brandon [name]: Brandon [name] is a 29 year old male who presents voluntary and unaccompanied to [facility]. Pt was a poor historian during the assessment. Clinician asked the pt, "what brought you to the hospital?" Pt reported, "hearing voices and seeing things." Pt reported, seeing people who are ancient, when the sun shifts. Pt reported, seeing the events of Passover in the bible. Pt reported, hearing his name called and hearing someone telling him to hurt himself/others but he does not listen. Pt reported, "I want to start listening to them." Pt reported, he does not remember if he has access to weapons. Pt denies, SI, HI, AVH, self-injurious behaviors.   Pt's mother/legal guardian Brandon [name], [phone]), expressed the pt has not slept in a few days. Per mother, she and the pt met with Brandon [name] this past Friday,the pt was prescribed medications for sleep. Per mother, the sleep medication is not covered by Medicaid; so she call the pharmacy and left a message for Brandon [name] to prescribed another medication for sleep. Pt's mother reported, the pt has been taking 10 mg of Melatonin but its not working. Per mother, the pt told her he needs  assistance because he was going to open up his brain to get the voices out. Pt's mother reported, she observed the pt tap on his head but she showed him her head, that their heads are the same and he can not open up his brain. Per mother, the pt has been talking to himself all day, hitting walls, bricks but not himself. Per mother, the pt would tell the voices, "get out of my head, leave me alone." Pt's mother reported, the wanted help so she called 56. Pt mother reported, the pt does not have access to weapons, she has the knives put away and when she cooks she keeps them with her. Pt's mother reported, she does not feel the pt will be safe coming back home tonight.   Pt reported, smoking three cigarettes daily. Pt had a previous inpatient admission at [facility] on 04/05/2020.  Pt presents quiet, awake with soft speech. Pt's mood was anxious. Pt's affect was flat. Pt's thought content was suspicious. Pt was oriented x2. Pt's insight is lacking. Pt's judgement is impaired. Pt reported, if discharged from [facility] he can contract for safety.   Note per Brandon [name] dated 04/30/20: HPI: Brandon [name] is a 29 year old male with a history of schizophrenia who voluntarily presents with [facility] to [facility]. During the patient assessment, he was responding to internal stimuli. Every question that was asked to him, he would turn his head and speak to the walls. He was then asked if  there were someone behind him that he was seeing? He voiced "no." He continued to do that until the assessment was over. There were periods that he was left alone, and he was having an entire conversation with himself. He began to get loud. He presented to be in an argument with someone. The patient started yelling, and he had to be re-directed by staff. The patient continues to provide contradictory information. TTS Counselor Brandon [name], made attempts to contact the patient's mom with his permission, but Ms. Stoverwas  unsuccessful. The patient was asked how he got to the hospital? The patient stated that he had brought himself here. The patient says he came to [facility] for a medication refill because he ran out of his current medication. He also voiced that his prescriptions are not working well for him.   The patient denies suicide and homicidal ideation. The patient endorses his appetite is good, having at least 2 meals a day. The patient states he sleeps suitable, but mom collaboration the patient has not for several days. The patient later retracted his former statement and replied that he did not sleep well.   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 Unspecified intellectual disabilities  F79       CCA Screening, Triage and Referral (STR)  Patient Reported Information How did you hear about [place]? Family/Friend  Referral name: [facility] [employer]  Referral phone number: [phone]   Whom do you see for routine medical problems? Primary [name]  Practice/Facility Name: [facility]  Practice/Facility Phone Number: No data recorded Name of Contact: No data recorded Contact Number: No data recorded Contact Fax Number: No data recorded Prescriber Name: No data recorded Prescriber Address (if known): No data recorded  What Is the Reason for Your Visit/Call Today? Mother brought patient to the [facility] due to him being actively psychotic and labile.  How Long Has This Been Causing You Problems? > than 6 months (patient has a long history of mental illness)  What Do You Feel Would Help You the Most Today? Assessment Only;Medication   Have You Recently Been in Any Inpatient Treatment (Hospital/Detox/Crisis Center/28-Day Program)? No  Name/Location of Program/Hospital:No data recorded How Long Were You There? No data recorded When Were You Discharged? No data recorded  Have You Ever Received Services From [facility] Before? Yes  Who Do  You See at [facility]? Pt was seen at [facility] on 04/19/2020 and 05/01/2020   Have You Recently Had Any Thoughts About Hurting Yourself? No  Are You Planning to Commit Suicide/Harm Yourself At This time? No   Have you Recently Had Thoughts About [place]? No  Explanation: No data recorded  Have You Used Any Alcohol or Drugs in the Past 24 Hours? No  How Long Ago Did You Use Drugs or Alcohol? No data recorded What Did You Use and How Much? No data recorded  Do You Currently Have a Therapist/Psychiatrist? Yes  Name of Therapist/Psychiatrist: Dr Toy [name] for medication management   Have You Been Recently Discharged From Any Office Practice or Programs? No  Explanation of Discharge From Practice/Program: Graduated [facility] [facility]     CCA Screening Triage Referral Assessment Type of Contact: Face-to-Face  Is this Initial or Reassessment? No data recorded Date Telepsych consult ordered in [facility]:  No data recorded Time Telepsych consult ordered in [facility]:  No data recorded  Patient Reported Information Reviewed? Yes  Patient Left Without  Being Seen? No data recorded Reason for Not Completing Assessment: No data recorded  Collateral Involvement: Brandon [name], mother/legal guardian, [phone].   Does Patient Have a Stage manager Guardian? No data recorded Name and Contact of Legal Guardian: No data recorded If Minor and Not Living with Parent(s), Who has Custody? No data recorded Is CPS involved or ever been involved? Never  Is APS involved or ever been involved? Never   Patient Determined To Be At Risk for Harm To Self or Others Based on Review of Patient Reported Information or Presenting Complaint? No  Method: No data recorded Availability of Means: No data recorded Intent: No data recorded Notification Required: No data recorded Additional Information for Danger to Others Potential: No data recorded Additional Comments for Danger to Others Potential: No  data recorded Are There Guns or Other Weapons in Your Home? No data recorded Types of Guns/Weapons: No data recorded Are These Weapons Safely Secured?                            No data recorded Who Could Verify You Are Able To Have These Secured: No data recorded Do You Have any Outstanding Charges, Pending Court Dates, Parole/Probation? No data recorded Contacted To Inform of Risk of Harm To Self or Others: No data recorded  Location of Assessment: [facility] Services   Does Patient Present under Involuntary Commitment? No  IVC Papers Initial File Date: No data recorded  [place] of Residence: [place]   Patient Currently Receiving the Following Services: Medication Management   Determination of Need: Emergent (2 hours)   Options For Referral: Inpatient Hospitalization     CCA Biopsychosocial  Intake/Chief Complaint:  CCA Intake With Chief Complaint CCA Part Two Date: 05/03/20 CCA Part Two Time: 1506 Chief Complaint/Presenting Problem: AVH, responding to internal stimuli and/or delusional thought content. Patient's mood is labile. Patient's Currently Reported Symptoms/Problems: AVH, responding to internal stimuli and/or delusional thought content. Individual's Strengths: Support system. Individual's Preferences: Patient has no preferences that require accommodation Individual's Abilities: UTA to assess, patient is psychotic and uncooperative Type of Services Patient Feels Are Needed: UTA to assess, patient is psychotic and uncooperative Initial Clinical Notes/Concerns: Pt seen Brandon [name] on 04/26/2020.  Mental Health Symptoms Depression:  Depression: None  Mania:  Mania: Recklessness  Anxiety:   Anxiety: None  Psychosis:  Psychosis: Grossly disorganized or catatonic behavior, Grossly disorganized speech, Duration of symptoms greater than six months, Hallucinations  Trauma:  Trauma: None  Obsessions:  Obsessions: None  Compulsions:  Compulsions: None   Inattention:  Inattention: None  Hyperactivity/Impulsivity:  Hyperactivity/Impulsivity: N/A  Oppositional/Defiant Behaviors:  Oppositional/Defiant Behaviors: N/A  Emotional Irregularity:  Emotional Irregularity: Mood lability, Potentially harmful impulsivity  Other Mood/Personality Symptoms:      Mental Status Exam Appearance and self-[name]  Stature:  Stature: Tall  Weight:  Weight: Overweight  Clothing:  Clothing: Neat/clean  Grooming:  Grooming: Normal  Cosmetic use:  Cosmetic Use: None  Posture/gait:  Posture/Gait: Tense  Motor activity:  Motor Activity: Restless  Sensorium  Attention:  Attention: Confused  Concentration:  Concentration: Scattered  Orientation:  Orientation: Person, Place  Recall/memory:  Recall/Memory: Defective in Immediate  Affect and Mood  Affect:  Affect: Flat  Mood:  Mood: Anxious  Relating  Eye contact:  Eye Contact: Fleeting  Facial expression:  Facial Expression: Angry  Attitude toward examiner:  Attitude Toward Examiner: Guarded  Thought and Language  Speech flow: Speech Flow: Loud,  Pressured  Thought content:  Thought Content: Suspicious  Preoccupation:  Preoccupations: None  Hallucinations:  Hallucinations: Auditory, Visual  Organization:     Transport planner of Knowledge:  Fund of Knowledge: Fair  Intelligence:  Intelligence: Average  Abstraction:  Abstraction: Normal  Judgement:  Judgement: Impaired  Reality Testing:  Reality Testing: Unaware  Insight:  Insight: Lacking  Decision Making:  Decision Making: Confused, Only simple  Social Functioning  Social Maturity:  Social Maturity: Impulsive  Social Judgement:  Social Judgement: Naive  Stress  Stressors:  Stressors: Other (Comment)  Coping Ability:  Coping Ability: Normal  Skill Deficits:  Skill Deficits: Self-control, Responsibility  Supports:  Supports: Family     Religion: Religion/Spirituality Are You A Religious Person?: Yes How Might This Affect Treatment?: not  assessed  Leisure/Recreation: Leisure / Recreation Do You Have Hobbies?: Yes Leisure and Hobbies: Pt reported, "I like to write."  Exercise/Diet: Exercise/Diet Do You Exercise?: Yes What Type of Exercise Do You Do?: Run/Walk How Many Times a Week Do You Exercise?: Daily Have You Gained or Lost A Significant Amount of Weight in the Past Six Months?: No Do You Follow a Special Diet?: Yes Type of Diet: Per pt, "vegan." Do You Have Any Trouble Sleeping?: Yes Explanation of Sleeping Difficulties: Per mother, the pt has not been sleeping.   CCA Employment/Education  Employment/Work Situation: Employment / Work Situation Employment situation: On disability Why is patient on disability: MS, Schizophrenia How long has patient been on disability: UTA Patient's job has been impacted by current illness: No What is the longest time patient has a held a job?: NA Where was the patient employed at that time?: NA Has patient ever been in the [employer]?: No  Education: Education Name of [place]: [employer] Did You Graduate From [employer]?: No Did You Attend College?: No Did [place]?: No Did You Have An Individualized Education Program (IIEP): Yes Did You Have Any Difficulty At School?: No Patient's Education Has Been Impacted by Current Illness: No   CCA Family/Childhood History  Family and Relationship History: Family history Marital status: Single Are you sexually active?: No What is your sexual orientation?: not assessed Has your sexual activity been affected by drugs, alcohol, medication, or emotional stress?: not assessed Does patient have children?: No  Childhood History:  Childhood History By whom was/is the patient raised?: Mother Additional childhood history information: NA Description of patient's relationship with caregiver when they were a child: Patient was close to his mother growing up Patient's description of current relationship  with people who raised him/her: Patient has a good relationship with his mother How were you disciplined when you got in trouble as a child/adolescent?: Patient states that he was disciplined appropriately as a child Does patient have siblings?: No Did patient suffer any verbal/emotional/physical/sexual abuse as a child?: No Did patient suffer from severe childhood neglect?: No Has patient ever been sexually abused/assaulted/raped as an adolescent or adult?: No Was the patient ever a victim of a crime or a disaster?: No Witnessed domestic violence?: No Has patient been affected by domestic violence as an adult?: No  Child/Adolescent Assessment:     CCA Substance Use  Alcohol/Drug Use: Alcohol / Drug Use Pain Medications: See MAR Prescriptions: See MAR Over the Counter: See MAR History of alcohol / drug use?: No history of alcohol / drug abuse Longest period of sobriety (when/how long): unknown  ASAM's:  Six Dimensions of Multidimensional Assessment  Dimension 1:  Acute Intoxication and/or Withdrawal Potential:      Dimension 2:  Biomedical Conditions and Complications:      Dimension 3:  Emotional, Behavioral, or Cognitive Conditions and Complications:     Dimension 4:  Readiness to Change:     Dimension 5:  Relapse, Continued use, or Continued Problem Potential:     Dimension 6:  Recovery/Living Environment:     ASAM Severity Score:    ASAM Recommended Level of Treatment:     Substance use Disorder (SUD)    Recommendations for Services/Supports/Treatments:    DSM5 Diagnoses: Patient Active Problem List   Diagnosis Date Noted  . Unspecified intellectual disabilities 01/27/2020  . Migraine headache 04/18/2019  . Generalized weakness   . Pulmonary embolism ([place]) 08/12/2017  . Hyperglycemia 07/31/2017  . Multiple sclerosis exacerbation ([place]) 07/30/2017  . Acute encephalopathy 07/30/2017  . Schizoaffective disorder, bipolar type  ([place])   . Disorganized schizophrenia ([place])   . Hallucinations 08/05/2014  . Auditory hallucination   . Left-sided weakness 01/18/2014  . Multiple sclerosis ([place]) 05/20/2013  . 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 Abnormality of gait 11/23/2012  . HTN (hypertension) 07/04/2011  . 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 Obesity, Class III, BMI 40-49.9 (morbid obesity) ([place]) 01/14/2010  . ADHD 01/14/2010    Disposition:  Per Marvia Pickles, NP, Inpatient Treatment is recommended   Referrals to Alternative Service(s): Referred to Alternative Service(s):   Place:   Date:   Time:    Referred to Alternative Service(s):   Place:   Date:   Time:    Referred to Alternative Service(s):   Place:   Date:   Time:    Referred to Alternative Service(s):   Place:   Date:   Time:     Judeth Porch Safina Huard

## 2020-05-03 NOTE — ED Triage Notes (Signed)
Patient is having auditory hallucinations. Patient denies SI/HI. Patient unable to state when he last took his medications.

## 2020-05-03 NOTE — ED Notes (Signed)
Pt A&O x 4, resting at present, responding to internal stimuli, laughing and talking inappropriately.  No distress noted, pt calm & cooperative.  Monitoring for safety.

## 2020-05-03 NOTE — ED Notes (Signed)
Locker 11.

## 2020-05-03 NOTE — ED Notes (Signed)
Pt calmer at present, no distress noted, sitting up at bedside nodding at present. Refuses to lay down.  Monitoring for safety.

## 2020-05-03 NOTE — ED Notes (Signed)
Meal given

## 2020-05-04 DIAGNOSIS — F209 Schizophrenia, unspecified: Secondary | ICD-10-CM | POA: Diagnosis not present

## 2020-05-04 MED ORDER — LORAZEPAM 2 MG/ML IJ SOLN: 2.0000 mg | Freq: Four times a day (QID) | INTRAMUSCULAR | Status: DC | PRN

## 2020-05-04 MED ORDER — HALOPERIDOL 10 MG PO TABS: 10.0000 mg | ORAL_TABLET | Freq: Every day | ORAL | 0 refills | Status: DC

## 2020-05-04 MED ORDER — DIPHENHYDRAMINE HCL 50 MG/ML IJ SOLN: 50.0000 mg | Freq: Four times a day (QID) | INTRAMUSCULAR | Status: DC | PRN

## 2020-05-04 MED ORDER — HALOPERIDOL LACTATE 5 MG/ML IJ SOLN: 10.0000 mg | Freq: Four times a day (QID) | INTRAMUSCULAR | Status: DC | PRN

## 2020-05-04 MED ORDER — HALOPERIDOL 5 MG PO TABS: 5.0000 mg | ORAL_TABLET | Freq: Every morning | ORAL | 0 refills | Status: DC

## 2020-05-04 MED ORDER — OLANZAPINE 10 MG PO TABS: 10.0000 mg | ORAL_TABLET | Freq: Two times a day (BID) | ORAL | Status: DC

## 2020-05-04 MED ORDER — TEMAZEPAM 30 MG PO CAPS: 30.0000 mg | ORAL_CAPSULE | Freq: Every day | ORAL | 0 refills | Status: DC

## 2020-05-04 MED ORDER — HALOPERIDOL 5 MG PO TABS
5.0000 mg | ORAL_TABLET | Freq: Every morning | ORAL | Status: DC
  Administered 2020-05-04: 5 mg via ORAL
  Filled 2020-05-04: qty 1

## 2020-05-04 MED ORDER — HALOPERIDOL 5 MG PO TABS: 10.0000 mg | ORAL_TABLET | Freq: Every day | ORAL | Status: DC

## 2020-05-04 NOTE — ED Notes (Signed)
Called pt mother informing her pt just left facility on way to home. Pt's mother verbalized appreciation.

## 2020-05-04 NOTE — ED Notes (Signed)
Pt educated on avs. Pt verbalized understanding. Escorted to retrieve belongings. Ambulated per self. Escorted out back [place] to safe transport for transportation. No new issues noted. Stable at time of d/c

## 2020-05-04 NOTE — Discharge Instructions (Addendum)

## 2020-05-04 NOTE — ED Notes (Signed)
Pt sleeping in no acute distress. Safety maintained. 

## 2020-05-04 NOTE — ED Notes (Signed)
Patient given medication, prepped for discharge.

## 2020-05-04 NOTE — ED Notes (Signed)
Patient restless this hour

## 2020-05-04 NOTE — ED Provider Notes (Signed)
[facility]/O[facility] ASAP Discharge Summary  Date and Time: 05/04/2020 8:32 AM  Name: Brandon [name]  MRN:  [id number]   Discharge Diagnoses:  Final diagnoses:  Disorganized schizophrenia (HCC)  Unspecified intellectual disabilities    Subjective: Patient reports today that he is feeling much better after getting the medication and sleeping a lot last night patient continues to report that he does not really know what made him feel that way he states that he realizes that his hallucinations have gotten worse and he was at.  Patient was asked if Haldol has worked for him in the past and he stated yes as well as patient states that he is willing to switch back to Haldol the attempt to remain stable.  He states that he plans to follow-up with Dr. Evelene [name] patient treatment.  Patient is unsure if his next appointment.  Patient denies any suicidal or homicidal ideations and denies any hallucinations. [name], legal guardian, was contacted for safety planning collateral information Brandon [name] is doing much better today and that he is on Rexulti the Rexulti and start him on Adderall 5 mg p.o. every morning and 10 mg p.o. nightly.  She is informed that he has a follow-up with Dr. Evelene [name] on 05/14/2020 at 3:40 PM.  She is informed of how much she is improved since yesterday and that he is denying having any she reports that if the patient is seeing much more calmer and appropriate that she has no concerns with him  Stay Summary: Patient is a 29 year old male known history of schizophrenia and unspecified intellectual disabilities.  Patient is well-known to this facility and presented yesterday actively hallucinating and agitated.  Patient was yelling out and cursing.  The patient has been prescribed Rexulti being up to 3 mg p.o. daily which per the mom's report the patient has achieved that dosing.  It appears that the patient has been gradually decreasing over the last week or so her room this morning yelling and cursing  and having active follows to the police station.  Patient received Haldol 10 mg IM, Benadryl 50 mg IM Ativan 2 mg IM x1 time dose and then received another dose of Haldol and Benadryl last night.  Mood the patient had presented much more stable with logical thought in conversation.  Patient denied having any hallucinations and denied any suicidal or homicidal ideations.  Patient agreed to continue the Haldol to maintain stability and that will follow-up with Dr. Evelene [name].  Did speak with Dr. Evelene [name] yesterday with patient presented and she reports that she is considering placing the patient on Clozaril.  Patient was discharged home with prescriptions of Haldol 5 mg p.o. every morning and 10 mg nightly, instructed to continue current home medications except for Rexulti and to discontinue with information for his next follow-up form on 11 8.  Total Time spent with patient: 30 minutes  Past Psychiatric History: ADHD, bipolar 1, schizophrenia, numerous ED visits as well as numerous inpatient hospitalizations Past Medical History:  Past Medical History:  Diagnosis Date  . ADHD (attention deficit hyperactivity disorder)   . Bipolar 1 disorder (HCC)   . Chronic back pain   . Chronic constipation   . Chronic neck pain   . Hypertension   . Multiple sclerosis (HCC) 05/20/2013   left sided weakness, dysarthria  . Non-compliance   . Obesity   . Pulmonary embolism (HCC)   . Schizophrenia (HCC)   . Stroke [facility])    left sided deficits - pt's mother denies this  Past Surgical History:  Procedure Laterality Date  . NO PAST SURGERIES    . None    . TOOTH EXTRACTION N/A 06/24/2019   Procedure: DENTAL RESTORATION/EXTRACTION OF TEETH NUMBER ONE, SIXTEEN, SEVENTEEN, NINETEEN, THIRTY-TWO;  Surgeon: Brandon [name], DDS;  Location: [facility] OR;  Service: Oral Surgery;  Laterality: N/A;   Family History:  Family History  Problem Relation Age of Onset  . Diabetes Mother   . ADD / ADHD Brother    Family Psychiatric  History: Brother ADHD Social History:  Social History   Substance and Sexual Activity  Alcohol Use Not Currently  . Alcohol/week: 0.0 standard drinks   Comment: "A little bit"      Social History   Substance and Sexual Activity  Drug Use Not Currently  . Types: Marijuana   Comment: Last used: unknown     Social History   Socioeconomic History  . Marital status: Single    Spouse name: Not on file  . Number of children: 0  . Years of education: 11th  . Highest education level: Not on file  Occupational History    Employer: [employer]    Comment: Disbaled  Tobacco Use  . Smoking status: Current Every Day Smoker    Packs/day: 0.25    Types: Cigarettes  . Smokeless tobacco: Never Used  . Tobacco comment: 3 cigarettes a day  Vaping Use  . Vaping Use: Never used  Substance and Sexual Activity  . Alcohol use: Not Currently    Alcohol/week: 0.0 standard drinks    Comment: "A little bit"   . Drug use: Not Currently    Types: Marijuana    Comment: Last used: unknown   . Sexual activity: Not on file  Other Topics Concern  . Not on file  Social History Narrative   Patient lives at home with his mother.   Disabled.   Education 11 th grade .   Right handed.   Caffeine - one cup daily soda.   Social Determinants of Health   Financial Resource Strain:   . Difficulty of Paying Living Expenses: Not on file  Food Insecurity:   . Worried About Programme researcher, broadcasting/film/video in the Last Year: Not on file  . Ran Out of Food in the Last Year: Not on file  Transportation Needs:   . Lack of Transportation (Medical): Not on file  . Lack of Transportation (Non-Medical): Not on file  Physical Activity:   . Days of Exercise per Week: Not on file  . Minutes of Exercise per Session: Not on file  Stress:   . Feeling of Stress : Not on file  Social Connections:   . Frequency of Communication with Friends and Family: Not on file  . Frequency of Social Gatherings with Friends and Family:  Not on file  . Attends Religious Services: Not on file  . Active Member of Clubs or Organizations: Not on file  . Attends Banker Meetings: Not on file  . Marital Status: Not on file   SDOH:  SDOH Screenings   Alcohol Screen:   . Last Alcohol Screening Score (AUDIT): Not on file  Depression (PHQ2-9):   . PHQ-2 Score: Not on file  Financial Resource Strain:   . Difficulty of Paying Living Expenses: Not on file  Food Insecurity:   . Worried About Programme researcher, broadcasting/film/video in the Last Year: Not on file  . Ran Out of Food in the Last Year: Not on file  Housing:   .  Last Housing Risk Score: Not on file  Physical Activity:   . Days of Exercise per Week: Not on file  . Minutes of Exercise per Session: Not on file  Social Connections:   . Frequency of Communication with Friends and Family: Not on file  . Frequency of Social Gatherings with Friends and Family: Not on file  . Attends Religious Services: Not on file  . Active Member of Clubs or Organizations: Not on file  . Attends Banker Meetings: Not on file  . Marital Status: Not on file  Stress:   . Feeling of Stress : Not on file  Tobacco Use: High Risk  . Smoking Tobacco Use: Current Every Day Smoker  . Smokeless Tobacco Use: Never Used  Transportation Needs:   . Freight forwarder (Medical): Not on file  . Lack of Transportation (Non-Medical): Not on file    Has this patient used any form of tobacco in the last 30 days? (Cigarettes, Smokeless Tobacco, Cigars, and/or Pipes) Prescription not provided because: Does not smoke  Current Medications:  Current Facility-Administered Medications  Medication Dose Route Frequency Provider Last Rate Last Admin  . acetaminophen (TYLENOL) tablet 650 mg  650 mg Oral Q6H PRN Bassheva Flury, Gerlene Burdock, FNP      . alum & mag hydroxide-simeth (MAALOX/MYLANTA) 200-200-20 MG/5ML suspension 30 mL  30 mL Oral Q4H PRN Lilou Kneip, Feliz Beam B, FNP      . amLODipine (NORVASC) tablet 10 mg  10  mg Oral Daily Zahi Plaskett, Gerlene Burdock, FNP      . apixaban (ELIQUIS) tablet 5 mg  5 mg Oral BID Allenmichael Mcpartlin, Gerlene Burdock, FNP   5 mg at 05/03/20 2113  . diphenhydrAMINE (BENADRYL) injection 50 mg  50 mg Intramuscular Q6H PRN Denetria Luevanos, Feliz Beam B, FNP      . haloperidol (HALDOL) tablet 10 mg  10 mg Oral QHS Sophia Cubero B, FNP      . haloperidol (HALDOL) tablet 5 mg  5 mg Oral q morning - 10a Nimisha Rathel, Feliz Beam B, FNP      . haloperidol lactate (HALDOL) injection 10 mg  10 mg Intramuscular Q6H PRN Tavon Magnussen, Gerlene Burdock, FNP      . LORazepam (ATIVAN) injection 2 mg  2 mg Intramuscular Q6H PRN Aaditya Letizia, Feliz Beam B, FNP      . magnesium hydroxide (MILK OF MAGNESIA) suspension 30 mL  30 mL Oral Daily PRN Ruthanne Mcneish, Gerlene Burdock, FNP      . metFORMIN (GLUCOPHAGE-XR) 24 hr tablet 500 mg  500 mg Oral Q breakfast Elfrida Pixley, Feliz Beam B, FNP      . temazepam (RESTORIL) capsule 30 mg  30 mg Oral QHS Lunden Mcleish, Gerlene Burdock, FNP   30 mg at 05/03/20 2112   Current Outpatient Medications  Medication Sig Dispense Refill  . amLODipine (NORVASC) 10 MG tablet Take 10 mg by mouth daily.    Marland Kitchen apixaban (ELIQUIS) 5 MG TABS tablet Take 5 mg by mouth 2 (two) times daily.     . Calcium Citrate-Vitamin D (CALCIUM + D PO) Take 1 tablet by mouth daily.    Marland Kitchen docusate sodium (COLACE) 100 MG capsule Take 400 mg by mouth at bedtime.     . ferrous gluconate (IRON 27) 240 (27 FE) MG tablet Take 240 mg by mouth daily.    . haloperidol (HALDOL) 10 MG tablet Take 1 tablet (10 mg total) by mouth at bedtime. 30 tablet 0  . haloperidol (HALDOL) 5 MG tablet Take 1 tablet (5 mg total) by mouth every  morning. 30 tablet 0  . Melatonin 10 MG TABS Take 10 mg by mouth at bedtime. 30 tablet 0  . metFORMIN (GLUCOPHAGE-XR) 500 MG 24 hr tablet Take 500 mg by mouth daily with breakfast.    . Multiple Vitamin (MULTIVITAMIN WITH MINERALS) TABS tablet Take 1 tablet by mouth daily.    Marland Kitchen ocrelizumab in sodium chloride 0.9 % 500 mL CHANGE ::  Ocrevus  IV EVERY 8 (EIGHT) months. 1 each 2  . temazepam  (RESTORIL) 30 MG capsule Take 1 capsule (30 mg total) by mouth at bedtime. 30 capsule 0    PTA Medications: (Not in a hospital admission)   Musculoskeletal  Strength & Muscle Tone: within normal limits Gait & Station: normal Patient leans: N/A  Psychiatric Specialty Exam  Presentation  General Appearance: Appropriate for Environment;Casual  Eye Contact:Good  Speech:Clear and Coherent;Normal Rate  Speech Volume:Normal  Handedness:Right   Mood and Affect  Mood:Euthymic  Affect:Appropriate;Congruent   Thought Process  Thought Processes:Coherent  Descriptions of Associations:Intact  Orientation:Full (Time, Place and Person)  Thought Content:WDL  Hallucinations:Hallucinations: None  Ideas of Reference:None  Suicidal Thoughts:Suicidal Thoughts: No  Homicidal Thoughts:Homicidal Thoughts: No   Sensorium  Memory:Immediate Good;Recent Good;Remote Good  Judgment:Fair  Insight:Good   Executive Functions  Concentration:Good  Attention Span:Good  Recall:Good  Fund of Knowledge:Fair  Language:Good   Psychomotor Activity  Psychomotor Activity:Psychomotor Activity: Normal   Assets  Assets:Communication Skills;Desire for Improvement;Financial Resources/Insurance;Housing;Social Support;Transportation   Sleep  Sleep:Sleep: Good   Physical Exam  Physical Exam Vitals and nursing note reviewed.  Constitutional:      Appearance: He is well-developed.  HENT:     Head: Normocephalic.  Eyes:     Pupils: Pupils are equal, round, and reactive to light.  Cardiovascular:     Rate and Rhythm: Normal rate.  Pulmonary:     Effort: Pulmonary effort is normal.  Musculoskeletal:        General: Normal range of motion.  Neurological:     Mental Status: He is alert and oriented to person, place, and time.    Review of Systems  Constitutional: Negative.   HENT: Negative.   Eyes: Negative.   Respiratory: Negative.   Cardiovascular: Negative.    Gastrointestinal: Negative.   Genitourinary: Negative.   Musculoskeletal: Negative.   Skin: Negative.   Neurological: Negative.   Endo/Heme/Allergies: Negative.   Psychiatric/Behavioral: Negative.    Blood pressure 135/71, pulse 80, temperature 98.3 F (36.8 C), temperature source Oral, resp. rate 18, SpO2 100 %. There is no height or weight on file to calculate BMI.  Demographic Factors:  Male  Loss Factors: NA  Historical Factors: Impulsivity  Risk Reduction Factors:   Sense of responsibility to family, Living with another person, especially a relative, Positive social support and Positive therapeutic relationship  Continued Clinical Symptoms:  Previous Psychiatric Diagnoses and Treatments  Cognitive Features That Contribute To Risk:  None    Suicide Risk:  Minimal: No identifiable suicidal ideation.  Patients presenting with no risk factors but with morbid ruminations; may be classified as minimal risk based on the severity of the depressive symptoms  Plan Of Care/Follow-up recommendations:  Continue activity as tolerated. Continue diet as recommended by your PCP. Ensure to keep all appointments with outpatient providers.  Disposition: Discharge home via safe transport to mother, legal guardian  Maryfrances Bunnell, FNP 05/04/2020, 8:32 AM

## 2020-05-04 NOTE — ED Notes (Signed)
Pt sleeping at present, no distress noted, monitoring for safety. 

## 2020-05-07 ENCOUNTER — Other Ambulatory Visit: Payer: Self-pay

## 2020-05-07 ENCOUNTER — Encounter (HOSPITAL_COMMUNITY): Payer: Self-pay

## 2020-05-07 DIAGNOSIS — F419 Anxiety disorder, unspecified: Secondary | ICD-10-CM | POA: Insufficient documentation

## 2020-05-07 DIAGNOSIS — F201 Disorganized schizophrenia: Secondary | ICD-10-CM | POA: Insufficient documentation

## 2020-05-07 DIAGNOSIS — R450 Nervousness: Secondary | ICD-10-CM | POA: Insufficient documentation

## 2020-05-07 NOTE — Progress Notes (Signed)
Received Brandon [name] at the [facility] with [employer], he came into the facility related to positive voices when he was trying to go to sleep. He stated compliance with his medication.

## 2020-05-08 ENCOUNTER — Other Ambulatory Visit: Payer: Self-pay

## 2020-05-08 ENCOUNTER — Ambulatory Visit (HOSPITAL_COMMUNITY)
Admission: EM | Admit: 2020-05-08 | Discharge: 2020-05-08 | Disposition: A | Discharge status: Home / Self Care | Payer: Medicare Other | Attending: Psychiatry | Admitting: Psychiatry

## 2020-05-08 ENCOUNTER — Encounter (HOSPITAL_COMMUNITY): Payer: Self-pay

## 2020-05-08 ENCOUNTER — Emergency Department (HOSPITAL_COMMUNITY)
Admission: EM | Admit: 2020-05-08 | Discharge: 2020-05-09 | Disposition: A | Discharge status: Home / Self Care | Payer: Medicare Other | Attending: Emergency Medicine | Admitting: Emergency Medicine

## 2020-05-08 DIAGNOSIS — R440 Auditory hallucinations: Secondary | ICD-10-CM | POA: Diagnosis present

## 2020-05-08 DIAGNOSIS — F201 Disorganized schizophrenia: Secondary | ICD-10-CM

## 2020-05-08 DIAGNOSIS — F17210 Nicotine dependence, cigarettes, uncomplicated: Secondary | ICD-10-CM | POA: Diagnosis not present

## 2020-05-08 DIAGNOSIS — F250 Schizoaffective disorder, bipolar type: Secondary | ICD-10-CM | POA: Diagnosis not present

## 2020-05-08 DIAGNOSIS — I10 Essential (primary) hypertension: Secondary | ICD-10-CM | POA: Insufficient documentation

## 2020-05-08 DIAGNOSIS — R451 Restlessness and agitation: Secondary | ICD-10-CM | POA: Diagnosis not present

## 2020-05-08 DIAGNOSIS — Z79899 Other long term (current) drug therapy: Secondary | ICD-10-CM | POA: Diagnosis not present

## 2020-05-08 DIAGNOSIS — Z20822 Contact with and (suspected) exposure to covid-19: Secondary | ICD-10-CM | POA: Diagnosis not present

## 2020-05-08 DIAGNOSIS — Z7901 Long term (current) use of anticoagulants: Secondary | ICD-10-CM | POA: Diagnosis not present

## 2020-05-08 LAB — CBC WITH DIFFERENTIAL/PLATELET
Abs Immature Granulocytes: 0.05 10*3/uL (ref 0.00–0.07)
Basophils Absolute: 0.1 10*3/uL (ref 0.0–0.1)
Basophils Relative: 1 %
Eosinophils Absolute: 0.6 10*3/uL — ABNORMAL HIGH (ref 0.0–0.5)
Eosinophils Relative: 7 %
HCT: 41.2 % (ref 39.0–52.0)
Hemoglobin: 12.6 g/dL — ABNORMAL LOW (ref 13.0–17.0)
Immature Granulocytes: 1 %
Lymphocytes Relative: 31 %
Lymphs Abs: 2.4 10*3/uL (ref 0.7–4.0)
MCH: 26 pg (ref 26.0–34.0)
MCHC: 30.6 g/dL (ref 30.0–36.0)
MCV: 84.9 fL (ref 80.0–100.0)
Monocytes Absolute: 0.7 10*3/uL (ref 0.1–1.0)
Monocytes Relative: 10 %
Neutro Abs: 4 10*3/uL (ref 1.7–7.7)
Neutrophils Relative %: 50 %
Platelets: 180 10*3/uL (ref 150–400)
RBC: 4.85 MIL/uL (ref 4.22–5.81)
RDW: 14.6 % (ref 11.5–15.5)
WBC: 7.8 10*3/uL (ref 4.0–10.5)
nRBC: 0 % (ref 0.0–0.2)

## 2020-05-08 LAB — SALICYLATE LEVEL: Salicylate Lvl: <7 mg/dL — ABNORMAL LOW (ref 7.0–30.0)

## 2020-05-08 LAB — ETHANOL: Alcohol, Ethyl (B): <10 mg/dL (ref <10)

## 2020-05-08 LAB — ACETAMINOPHEN LEVEL: Acetaminophen (Tylenol), Serum: <10 ug/mL — ABNORMAL LOW (ref 10–30)

## 2020-05-08 MED ORDER — LORAZEPAM 1 MG PO TABS
2.0000 mg | ORAL_TABLET | Freq: Once | ORAL | Status: AC
  Administered 2020-05-08: 2 mg via ORAL
  Filled 2020-05-08: qty 2

## 2020-05-08 MED ORDER — STERILE WATER FOR INJECTION IJ SOLN
INTRAMUSCULAR | Status: AC
  Administered 2020-05-08: 2.1 mL
  Filled 2020-05-08: qty 10

## 2020-05-08 MED ORDER — ZIPRASIDONE MESYLATE 20 MG IM SOLR
20.0000 mg | Freq: Once | INTRAMUSCULAR | Status: DC
  Filled 2020-05-08: qty 20

## 2020-05-08 MED ORDER — ZIPRASIDONE MESYLATE 20 MG IM SOLR
20.0000 mg | Freq: Once | INTRAMUSCULAR | Status: AC
  Administered 2020-05-08: 20 mg via INTRAMUSCULAR
  Filled 2020-05-08: qty 20

## 2020-05-08 NOTE — BH Assessment (Signed)
Comprehensive Clinical Assessment (CCA) Note  05/08/2020 ALANSON HAUSMANN [id number]  Chief Complaint:  Chief Complaint  Patient presents with  . Hallucinations    verbal   Visit Diagnosis: F20.9, Schizophrenia   CCA Screening, Triage and Referral (STR) Antione Obar is a 29 year old patient who was brought to the [facility]) after he and his grandmother got into an argument regarding him leaving the home after dark because she had concerns that it was dangerous. Pt states that he left the home and walked to the [facility], where he sat down on one of the couches and fell asleep. Clinician and the NP attempted to see him and pt was groggy and stated he wanted to continue to sleep, so they left him to do so until, several hours later, he was ready to be seen.  Pt shared he was experiencing AH but that, otherwise, he was feeling much better than he was when he came into the [facility] earlier in the night. Pt denied SI, HI, VH, NSSIB, access to guns/weapons, engagement with the legal system, or SA. Pt expressed a desire to return to his grandmother's home and requested a ride. Clinician made several phone calls and was able to obtain transportation for pt, which he accepted. While pt waited for transportation he accepted the snacks (sandwiches, cookies, and milk) offered.  Pt's protective factors are no SI, no HI, and no VH. He states he has both his grandmother's home and his mother's home that he can stay at.  Pt's legal guardian, his mother, was unable to be reached by phone during this visit.  Pt is oriented x5. His recent/remote memory is fair. Pt was, overall, cooperative throughout the assessment process. Pt's insight, judgement, and impulse control is fair at this time.   Recommendations for Services/Supports/Treatments: Lindon Romp, NP, reviewed pt's chart and information and met with pt and determined pt can be psych cleared. Pt was reminded of his upcoming appointment  with Dr. Toy Care on Monday, May 14, 2020, which pt expressed an understanding of. Pt expressed understanding that he can return to the [facility] at any time if he has concerns or feels his symptoms worsening. Pt shared no concerns at this time.   Patient Reported Information How did you hear about [place]? Self  Referral name: Self  Referral phone number: 911   Whom do you see for routine medical problems? Primary Care  Practice/Facility Name: [facility]  Practice/Facility Phone Number: No data recorded Name of Contact: No data recorded Contact Number: No data recorded Contact Fax Number: No data recorded Prescriber Name: No data recorded Prescriber Address (if known): No data recorded  What Is the Reason for Your Visit/Call Today? Pt states he was tired of being inside at home and wanted to go outside; he states his grandmother didn't want him to go outside b/c it was dangerous and they got into an argument. Due to this, pt left the home and walked to the [facility].  How Long Has This Been Causing You Problems? > than 6 months (patient has a long history of mental illness)  What Do You Feel Would Help You the Most Today? Assessment Only   Have You Recently Been in Any Inpatient Treatment (Hospital/Detox/Crisis Center/28-Day Program)? No  Name/Location of Program/Hospital:No data recorded How Long Were You There? No data recorded When Were You Discharged? No data recorded  Have You Ever Received Services From [facility] Before? Yes  Who Do You See at [facility]? Pt cannot remember  his prescriber's name   Have You Recently Had Any Thoughts About Hurting Yourself? No  Are You Planning to Commit Suicide/Harm Yourself At This time? No   Have you Recently Had Thoughts About [place]? No  Explanation: No data recorded  Have You Used Any Alcohol or Drugs in the Past 24 Hours? No  How Long Ago Did You Use Drugs or Alcohol? No data recorded What Did You Use and How Much? No data  recorded  Do You Currently Have a Therapist/Psychiatrist? No  Name of Therapist/Psychiatrist: Pt cannot remember his prescriber's name   Have You Been Recently Discharged From Any Office Practice or Programs? No  Explanation of Discharge From Practice/Program: Graduated [facility]     CCA Screening Triage Referral Assessment Type of Contact: Face-to-Face  Is this Initial or Reassessment? No data recorded Date Telepsych consult ordered in [facility]:  No data recorded Time Telepsych consult ordered in [facility]:  No data recorded  Patient Reported Information Reviewed? Yes  Patient Left Without Being Seen? No data recorded Reason for Not Completing Assessment: No data recorded  Collateral Involvement: Pt declined to provide verbal consent for clinician to contact any friends/family for collateral information.   Does Patient Have a Stage manager Guardian? No data recorded Name and Contact of Legal Guardian: No data recorded If Minor and Not Living with Parent(s), Who has Custody? N/A  Is CPS involved or ever been involved? Never  Is APS involved or ever been involved? Never   Patient Determined To Be At Risk for Harm To Self or Others Based on Review of Patient Reported Information or Presenting Complaint? No  Method: No data recorded Availability of Means: No data recorded Intent: No data recorded Notification Required: No data recorded Additional Information for Danger to Others Potential: No data recorded Additional Comments for Danger to Others Potential: No data recorded Are There Guns or Other Weapons in Your Home? No data recorded Types of Guns/Weapons: No data recorded Are These Weapons Safely Secured?                            No data recorded Who Could Verify You Are Able To Have These Secured: No data recorded Do You Have any Outstanding Charges, Pending Court Dates, Parole/Probation? No data recorded Contacted To Inform of Risk of Harm To Self or Others: No data  recorded  Location of Assessment: [facility]   Does Patient Present under Involuntary Commitment? No  IVC Papers Initial File Date: No data recorded  [place] of Residence: [place]   Patient Currently Receiving the Following Services: Medication Management   Determination of Need: Routine (7 days)   Options For Referral: Medication Management     CCA Biopsychosocial  Intake/Chief Complaint:  Pt states he was tired of being inside at home and wanted to go outside; he states his grandmother didn't want him to go outside b/c it was dangerous and they got into an argument. Due to this, pt left the home and walked to the [facility].   Patient Reported Schizophrenia/Schizoaffective Diagnosis in Past: Yes   Mental Health Symptoms Depression:  Fatigue   Duration of Depressive symptoms: Less than two weeks   Mania:  None   Anxiety:   None   Psychosis:  Hallucinations   Duration of Psychotic symptoms: Greater than six months   Trauma:  None   Obsessions:  None   Compulsions:  None   Inattention:  None   Hyperactivity/Impulsivity:  Fidgets with hands/feet   Oppositional/Defiant Behaviors:  None   Emotional Irregularity:  Intense/inappropriate anger;Mood lability;Potentially harmful impulsivity   Other Mood/Personality Symptoms:  N/A    Mental Status Exam Appearance and self-care  Stature:  Tall   Weight:  Average weight   Clothing:  Age-appropriate   Grooming:  Normal   Cosmetic use:  None   Posture/gait:  Normal   Motor activity:  Not Remarkable   Sensorium  Attention:  Confused;Distractible   Concentration:  Scattered   Orientation:  Person;Place   Recall/memory:  Defective in Immediate   Affect and Mood  Affect:  Blunted   Mood:  Irritable   Relating  Eye contact:  Fleeting   Facial expression:  Angry   Attitude toward examiner:  Guarded   Thought and Language  Speech flow: Garbled   Thought content:  Appropriate to  Mood and Circumstances   Preoccupation:  None   Hallucinations:  Auditory   Organization:  No data recorded  [employer] of Knowledge:  Poor   Intelligence:  Needs investigation   Abstraction:  Abstract   Judgement:  Impaired   Reality Testing:  Distorted   Insight:  Gaps   Decision Making:  Impulsive   Social Functioning  Social Maturity:  Impulsive   Social Judgement:  Heedless   Stress  Stressors:  Housing   Coping Ability:  Deficient supports   Skill Deficits:  Activities of daily living;Decision making;Self-control   Supports:  Family;Support needed      Religion: Religion/Spirituality Are You A Religious Person?:  (N/A) How Might This Affect Treatment?: N/A  Leisure/Recreation: Leisure / Recreation Do You Have Hobbies?:  (N/A) Leisure and Hobbies: N/A  Exercise/Diet: Exercise/Diet Do You Exercise?:  (N/A) Have You Gained or Lost A Significant Amount of Weight in the Past Six Months?:  (N/A) Do You Follow a Special Diet?:  (N/A) Do You Have Any Trouble Sleeping?:  (N/A)   CCA Employment/Education  Employment/Work Situation: Employment / Work Situation Employment situation: On disability Patient's job has been impacted by current illness:  (N/A) What is the longest time patient has a held a job?: N/A Where was the patient employed at that time?: N/A Has patient ever been in the [employer]?: No  Education: Education Is Patient Currently Attending School?:  (N/A) Last Grade Completed:  (N/A) Name of High School: N/A Did Teacher, adult education From [employer]?:  (N/A) Did You Attend College?:  (N/A) Did You Attend Graduate School?:  (N/A) Did You Have Any Special Interests In School?: N/A Did You Have An Individualized Education Program (IIEP):  (N/A) Did You Have Any Difficulty At School?:  (N/A) Patient's Education Has Been Impacted by Current Illness:  (N/A)   CCA Family/Childhood History  Family and Relationship  History: Family history Marital status: Single Are you sexually active?:  (N/A) What is your sexual orientation?: N/A Has your sexual activity been affected by drugs, alcohol, medication, or emotional stress?: N/A Does patient have children?:  (N/A)  Childhood History:  Childhood History By whom was/is the patient raised?:  (N/A) Additional childhood history information: N/A Description of patient's relationship with caregiver when they were a child: N/A Patient's description of current relationship with people who raised him/her: N/A How were you disciplined when you got in trouble as a child/adolescent?: N/A Does patient have siblings?:  (N/A) Did patient suffer any verbal/emotional/physical/sexual abuse as a child?:  (N/A) Did patient suffer from severe childhood neglect?:  (N/A) Has  patient ever been sexually abused/assaulted/raped as an adolescent or adult?:  (N/A) Was the patient ever a victim of a crime or a disaster?:  (N/A) Witnessed domestic violence?:  (N/A) Has patient been affected by domestic violence as an adult?:  (N/A)  Child/Adolescent Assessment:     CCA Substance Use  Alcohol/Drug Use: Alcohol / Drug Use Pain Medications: Please see MAR Prescriptions: Please see MAR Over the Counter: Please see MAR History of alcohol / drug use?: No history of alcohol / drug abuse Longest period of sobriety (when/how long): N/A                         ASAM's:  Six Dimensions of Multidimensional Assessment  Dimension 1:  Acute Intoxication and/or Withdrawal Potential:      Dimension 2:  Biomedical Conditions and Complications:      Dimension 3:  Emotional, Behavioral, or Cognitive Conditions and Complications:     Dimension 4:  Readiness to Change:     Dimension 5:  Relapse, Continued use, or Continued Problem Potential:     Dimension 6:  Recovery/Living Environment:     ASAM Severity Score:    ASAM Recommended Level of Treatment:     Substance use  Disorder (SUD)    Recommendations for Services/Supports/Treatments: Lindon Romp, NP, reviewed pt's chart and information and met with pt and determined pt can be psych cleared. Pt was reminded of his upcoming appointment with Dr. Toy Care on Monday, May 14, 2020, which pt expressed an understanding of. Pt expressed understanding that he can return to the [facility] at any time if he has concerns or feels his symptoms worsening. Pt shared no concerns at this time.   DSM5 Diagnoses: Patient Active Problem List   Diagnosis Date Noted  . Unspecified intellectual disabilities 01/27/2020  . Migraine headache 04/18/2019  . Generalized weakness   . Pulmonary embolism ([place]) 08/12/2017  . Hyperglycemia 07/31/2017  . Multiple sclerosis exacerbation ([place]) 07/30/2017  . Acute encephalopathy 07/30/2017  . Schizoaffective disorder, bipolar type ([place])   . Disorganized schizophrenia ([place])   . Hallucinations 08/05/2014  . Auditory hallucination   . Left-sided weakness 01/18/2014  . Multiple sclerosis ([place]) 05/20/2013  . 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 Abnormality of gait 11/23/2012  . HTN (hypertension) 07/04/2011  . 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 Obesity, Class III, BMI 40-49.9 (morbid obesity) ([place]) 01/14/2010  . ADHD 01/14/2010    Patient Centered Plan: Patient is on the following Treatment Plan(s):  Anxiety and Impulse Control   Referrals to Alternative Service(s): Referred to Alternative Service(s):   Place:   Date:   Time:    Referred to Alternative Service(s):   Place:   Date:   Time:    Referred to Alternative Service(s):   Place:   Date:   Time:    Referred to Alternative Service(s):   Place:   Date:   Time:     Dannielle Burn, LMFT

## 2020-05-08 NOTE — Progress Notes (Signed)
Patient lying down across chairs in lobby, resting with eyes closed. Respirations even and unlabored. No acute distress noted. Called patient's name multiple times. He awakens briefly. States that he just wants to sleep.

## 2020-05-08 NOTE — BH Assessment (Signed)
Clinician and NP enter the lobby where pt is sleeping on one of the couches with a pillow and blanket in an attempt to encourage him to come to an assessment room so we can complete pt's CCA. NP and clinician call pt's name multiple times with no response; clinician calls pt louder, to which pt jerks awake and states he is tired and just wants to sleep. Clinician requests pt let us know when he is ready to complete his assessment; pt immediately falls back asleep.

## 2020-05-08 NOTE — ED Notes (Signed)
Pt was aggressive talking out loud and hitting the bathroom door and trash can.

## 2020-05-08 NOTE — ED Triage Notes (Signed)
Pt arrives with [employer] with IVC papers petitioned by aunt. Papers sate pt has schizophrenia and has been hitting self in head and having visual hallucinations. Pt denies si/hi.

## 2020-05-08 NOTE — ED Provider Notes (Signed)
Behavioral Health Urgent Care Medical Screening Exam  Patient Name: Brandon [name] MRN: [id number] Date of Evaluation: 05/08/20 Chief Complaint:   Diagnosis:  Final diagnoses:  Disorganized schizophrenia (HCC)    History of Present illness: Brandon [name] is a 29 y.o. male who presents to [facility] voluntarily. On arrival, patient fell asleep in the lobby. He was monitored by staff. Around 5 am he woke up and requested to see provider. On evaluation, patient states that he was "tired of sitting around the house and wanted to get out." States that his mother asked him not to leave because it is dangerous outside at night. He states that he got mad and left and walked to [facility]. States that he lives nearby. patient is alert and oriented x 4, pleasant, and cooperative. Speech is clear and coherent. Mood is depressed and affect is congruent with mood. Thought process is coherent and thought content is logical. Denies current auditory and visual hallucinations. No indication that patient is responding to internal stimuli. No evidence of delusional thought content. Denies suicidal ideations. Denies homicidal ideations. Patient is requesting discharge. He states that he has contacted his mother who has agreed to pick him up once it is daylight outside. Patient was offered snacks and drinks while he waits. Patient reminded of his appointment with Dr. Evelene Croon on 05/14/2020.   Psychiatric Specialty Exam  Presentation  General Appearance:Appropriate for Environment;Casual  Eye Contact:Good  Speech:Clear and Coherent;Normal Rate  Speech Volume:Normal  Handedness:Right   Mood and Affect  Mood:Euthymic  Affect:Appropriate;Congruent   Thought Process  Thought Processes:Coherent  Descriptions of Associations:Intact  Orientation:Full (Time, Place and Person)  Thought Content:WDL  Hallucinations:None Voices  Ideas of Reference:None  Suicidal Thoughts:No  Homicidal Thoughts:No   Sensorium   Memory:Immediate Good;Recent Good;Remote Good  Judgment:Fair  Insight:Fair   Executive Functions  Concentration:Good  Attention Span:Good  Recall:Good  Fund of Knowledge:Fair  Language:Good   Psychomotor Activity  Psychomotor Activity:Normal   Assets  Assets:Communication Skills;Desire for Improvement;Financial Resources/Insurance;Housing;Transportation;Social Support   Sleep  Sleep:Good  Number of hours: No data recorded  Physical Exam: Physical Exam Constitutional:      General: He is not in acute distress.    Appearance: He is not ill-appearing, toxic-appearing or diaphoretic.  HENT:     Head: Normocephalic.     Right Ear: External ear normal.     Left Ear: External ear normal.  Eyes:     Pupils: Pupils are equal, round, and reactive to light.  Cardiovascular:     Rate and Rhythm: Normal rate.  Pulmonary:     Effort: Pulmonary effort is normal. No respiratory distress.  Musculoskeletal:        General: Normal range of motion.  Skin:    General: Skin is warm and dry.  Neurological:     Mental Status: He is alert and oriented to person, place, and time.  Psychiatric:        Mood and Affect: Mood is depressed.        Speech: Speech normal.        Behavior: Behavior is cooperative.        Thought Content: Thought content is not paranoid or delusional. Thought content does not include homicidal or suicidal ideation. Thought content does not include suicidal plan.    Review of Systems  Constitutional: Negative for chills, diaphoresis, fever, malaise/fatigue and weight loss.  HENT: Negative for congestion.   Respiratory: Negative for cough and shortness of breath.   Cardiovascular: Negative for chest pain  and palpitations.  Gastrointestinal: Negative for diarrhea, nausea and vomiting.  Neurological: Negative for dizziness and seizures.  Psychiatric/Behavioral: Positive for depression and hallucinations. Negative for memory loss, substance abuse and  suicidal ideas. The patient is nervous/anxious. The patient does not have insomnia.   All other systems reviewed and are negative.  Blood pressure (!) 146/94, temperature (!) 97.3 F (36.3 C), temperature source Tympanic, resp. rate 20, height 6\' 1"  (1.854 m), weight (!) 305 lb (138.3 kg), SpO2 100 %. Body mass index is 40.24 kg/m.  Musculoskeletal: Strength & Muscle Tone: within normal limits Gait & Station: normal Patient leans: N/A   [facility] MSE Discharge Disposition for Follow up and Recommendations: Based on my evaluation the patient does not appear to have an emergency medical condition and can be discharged with resources and follow up care in outpatient services for Medication Management and Individual Therapy   Disposition: No evidence of imminent risk to self or others at present.   Patient does not meet criteria for psychiatric inpatient admission. Supportive therapy provided about ongoing stressors. Discussed crisis plan, support from social network, calling 911, coming to the Emergency Department, and calling Suicide Hotline.    , NP 05/08/2020, 5:35 AM

## 2020-05-08 NOTE — Discharge Instructions (Signed)
°  Discharge recommendations:  Patient is to take medications as prescribed. Please see information for follow-up appointment with psychiatry and therapy. Please follow up with your primary care provider for all medical related needs.  Therapy: We recommend that patient participate in individual therapy to address mental health concerns. Medications: The patient/guardian is to contact a medical professional and/or outpatient provider to address any new side effects that develop. Patient/guardian should update outpatient providers of any new medications and/or medication changes.  Atypical antipsychotics: If you are prescribed an atypical antipsychotic, it is recommended that your height, weight, BMI, blood pressure, fasting lipid panel, and fasting blood sugar be monitored by your outpatient providers. Safety:  The patient should abstain from use of illicit substances/drugs and abuse of any medications. If symptoms worsen or do not continue to improve or if the patient becomes actively suicidal or homicidal then it is recommended that the patient return to the closet hospital emergency department, the [facility], or call 911 for further evaluation and treatment. National Suicide Prevention Lifeline 1-800-SUICIDE or 934 860 4525.

## 2020-05-08 NOTE — ED Notes (Signed)
Pt has one pt bag in ;ocker 31 which has his cloths and shoe in it.

## 2020-05-08 NOTE — ED Provider Notes (Signed)
[facility]-EMERGENCY DEPT Provider Note   CSN: [id number] Arrival date & time: 05/08/20  2200     History No chief complaint on file.   Brandon [name] is a 29 y.o. male with a h/o of ADHD, Bipolar 1 disorder, schizophrenia, HTN, obesity, intellectual disabilities, MS, and chronic pain who presents to the Emergency Department under IVC.  The patient was IVC'ed by his aunt earlier tonight. Family noticed he was hitting himself in the head repeatedly with his hands balled up in fists. He also was noticed to have both auditory and visual hallucinations.  He denies SI, HI, and auditory visual hallucinations to me.  In the ER, patient is initially cooperative, but states he wants to go home. "I don't have any mental problems anymore after they did the surgery."   He has no other complaints at this time, including chest pain, back pain, SOB, N/V/D, fever, or chills.    The history is provided by the patient and medical records. No language interpreter was used.       Past Medical History:  Diagnosis Date  . ADHD (attention deficit hyperactivity disorder)   . Bipolar 1 disorder (HCC)   . Chronic back pain   . Chronic constipation   . Chronic neck pain   . Hypertension   . Multiple sclerosis (HCC) 05/20/2013   left sided weakness, dysarthria  . Non-compliance   . Obesity   . Pulmonary embolism (HCC)   . Schizophrenia (HCC)   . Stroke [facility])    left sided deficits - pt's mother denies this    Patient Active Problem List   Diagnosis Date Noted  . Unspecified intellectual disabilities 01/27/2020  . Migraine headache 04/18/2019  . Generalized weakness   . Pulmonary embolism (HCC) 08/12/2017  . Hyperglycemia 07/31/2017  . Multiple sclerosis exacerbation (HCC) 07/30/2017  . Acute encephalopathy 07/30/2017  . Schizoaffective disorder, bipolar type (HCC)   . Disorganized schizophrenia (HCC)   . Hallucinations 08/05/2014  . Auditory hallucination   .  Left-sided weakness 01/18/2014  . Multiple sclerosis (HCC) 05/20/2013  . 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 Abnormality of gait 11/23/2012  . HTN (hypertension) 07/04/2011  . 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 Obesity, Class III, BMI 40-49.9 (morbid obesity) (HCC) 01/14/2010  . ADHD 01/14/2010    Past Surgical History:  Procedure Laterality Date  . NO PAST SURGERIES    . None    . TOOTH EXTRACTION N/A 06/24/2019   Procedure: DENTAL RESTORATION/EXTRACTION OF TEETH NUMBER ONE, SIXTEEN, SEVENTEEN, NINETEEN, THIRTY-TWO;  Surgeon: Ocie Doyne, DDS;  Location: [facility] OR;  Service: Oral Surgery;  Laterality: N/A;       Family History  Problem Relation Age of Onset  . Diabetes Mother   . ADD / ADHD Brother     Social History   Tobacco Use  . Smoking status: Current Every Day Smoker    Packs/day: 0.25    Types: Cigarettes  . Smokeless tobacco: Never Used  . Tobacco comment: 2 cigarettes a day  Vaping Use  . Vaping Use: Never used  Substance Use Topics  . Alcohol use: Not Currently    Alcohol/week: 0.0 standard drinks    Comment: "A little bit"   . Drug use: Not Currently    Types: Marijuana    Comment: Last used: unknown     Home Medications Prior to Admission medications   Medication Sig Start Date End Date Taking? Authorizing Provider  amLODipine (NORVASC) 10 MG tablet Take  10 mg by mouth daily.   Yes [provider]  apixaban (ELIQUIS) 5 MG TABS tablet Take 5 mg by mouth 2 (two) times daily.    Yes [provider]  Calcium Citrate-Vitamin D (CALCIUM + D PO) Take 1 tablet by mouth daily.   Yes [provider]  docusate sodium (COLACE) 100 MG capsule Take 400 mg by mouth at bedtime.    Yes [provider]  ferrous gluconate (IRON 27) 240 (27 FE) MG tablet Take 240 mg by mouth daily.   Yes [provider]  haloperidol (HALDOL) 10 MG tablet Take 1 tablet (10 mg total) by mouth at bedtime. 05/04/20  Yes Money, Gerlene Burdock, FNP    haloperidol (HALDOL) 5 MG tablet Take 1 tablet (5 mg total) by mouth every morning. 05/04/20  Yes Money, Gerlene Burdock, FNP  latanoprost (XALATAN) 0.005 % ophthalmic solution Place 1 drop into both eyes at bedtime.   Yes [provider]  Melatonin 10 MG TABS Take 10 mg by mouth at bedtime. 04/26/20  Yes Zena Amos, MD  metFORMIN (GLUCOPHAGE-XR) 500 MG 24 hr tablet Take 500 mg by mouth daily with breakfast.   Yes [provider]  Multiple Vitamin (MULTIVITAMIN WITH MINERALS) TABS tablet Take 1 tablet by mouth daily.   Yes [provider]  ocrelizumab in sodium chloride 0.9 % 500 mL CHANGE ::  Ocrevus 600mg  IV EVERY 8 (EIGHT) months. 11/10/19  Yes 01/10/20, NP  temazepam (RESTORIL) 30 MG capsule Take 1 capsule (30 mg total) by mouth at bedtime. 05/04/20  Yes Money, 05/06/20, FNP  propranolol (INDERAL) 10 MG tablet Take 1 tablet (10 mg total) by mouth 2 (two) times daily. 05/03/15 01/03/20  01/05/20, NP    Allergies    Patient has no known allergies.  Review of Systems   Review of Systems  Constitutional: Negative for appetite change, diaphoresis and fever.  Respiratory: Negative for shortness of breath.   Cardiovascular: Negative for chest pain.  Gastrointestinal: Negative for abdominal pain, diarrhea, nausea and vomiting.  Genitourinary: Negative for dysuria.  Musculoskeletal: Negative for back pain.  Skin: Negative for rash.  Allergic/Immunologic: Negative for immunocompromised state.  Neurological: Negative for seizures, syncope, weakness, numbness and headaches.  Psychiatric/Behavioral: Positive for agitation, behavioral problems and hallucinations. Negative for confusion and self-injury.    Physical Exam Updated Vital Signs BP (!) 145/88 (BP Location: Right Wrist)   Pulse 79   Temp 98.1 F (36.7 C) (Oral)   Resp 18   Ht 6\' 1"  (1.854 m)   Wt 136.1 kg   SpO2 97%   BMI 39.58 kg/m   Physical Exam Vitals and nursing note reviewed.   Constitutional:      Appearance: He is well-developed. He is not ill-appearing or toxic-appearing.  HENT:     Head: Normocephalic.  Eyes:     Conjunctiva/sclera: Conjunctivae normal.  Cardiovascular:     Rate and Rhythm: Normal rate and regular rhythm.     Pulses: Normal pulses.     Heart sounds: Normal heart sounds. No murmur heard.  No friction rub. No gallop.   Pulmonary:     Effort: Pulmonary effort is normal. No respiratory distress.     Breath sounds: No stridor. No wheezing, rhonchi or rales.  Chest:     Chest wall: No tenderness.  Abdominal:     General: There is no distension.     Palpations: Abdomen is soft. There is no mass.  Tenderness: There is no abdominal tenderness. There is no right CVA tenderness, left CVA tenderness, guarding or rebound.     Hernia: No hernia is present.  Musculoskeletal:     Cervical back: Neck supple.     Right lower leg: No edema.     Left lower leg: No edema.  Skin:    General: Skin is warm and dry.  Neurological:     Mental Status: He is alert.  Psychiatric:        Attention and Perception: He perceives auditory hallucinations.        Mood and Affect: Affect is angry and inappropriate.        Speech: Speech is rapid and pressured and delayed.        Behavior: Behavior is agitated.        Thought Content: Thought content does not include homicidal or suicidal ideation. Thought content does not include homicidal or suicidal plan.     Comments: He appears to actively be responding to external stimuli.     ED Results / Procedures / Treatments   Labs (all labs ordered are listed, but only abnormal results are displayed) Labs Reviewed  COMPREHENSIVE METABOLIC PANEL - Abnormal; Notable for the following components:      Result Value   Glucose, Bld 103 (*)    All other components within normal limits  CBC WITH DIFFERENTIAL/PLATELET - Abnormal; Notable for the following components:   Hemoglobin 12.6 (*)    Eosinophils Absolute 0.6  (*)    All other components within normal limits  ACETAMINOPHEN LEVEL - Abnormal; Notable for the following components:   Acetaminophen (Tylenol), Serum <10 (*)    All other components within normal limits  SALICYLATE LEVEL - Abnormal; Notable for the following components:   Salicylate Lvl <7.0 (*)    All other components within normal limits  RESPIRATORY PANEL BY RT PCR (FLU A&B, COVID)  ETHANOL  RAPID URINE DRUG SCREEN, HOSP PERFORMED    EKG None  Radiology No results found.  Procedures .Critical Care Performed by: Barkley Boards, PA-C Authorized by: Barkley Boards, PA-C   Critical care provider statement:    Critical care time (minutes):  50   Critical care time was exclusive of:  Separately billable procedures and treating other patients and teaching time   Critical care was necessary to treat or prevent imminent or life-threatening deterioration of the following conditions: psychosis.   Critical care was time spent personally by me on the following activities:  Ordering and performing treatments and interventions, ordering and review of laboratory studies, ordering and review of radiographic studies, pulse oximetry, re-evaluation of patient's condition, review of old charts, obtaining history from patient or surrogate, examination of patient, evaluation of patient's response to treatment, discussions with consultants and development of treatment plan with patient or surrogate   I assumed direction of critical care for this patient from another provider in my specialty: no     (including critical care time)  Medications Ordered in ED Medications  amLODipine (NORVASC) tablet 10 mg (has no administration in time range)  apixaban (ELIQUIS) tablet 5 mg (has no administration in time range)  temazepam (RESTORIL) capsule 30 mg (has no administration in time range)  metFORMIN (GLUCOPHAGE-XR) 24 hr tablet 500 mg (has no administration in time range)  latanoprost (XALATAN) 0.005 %  ophthalmic solution 1 drop (has no administration in time range)  haloperidol (HALDOL) tablet 5 mg (has no administration in time range)  haloperidol (HALDOL) tablet 10  mg (has no administration in time range)  docusate sodium (COLACE) capsule 400 mg (has no administration in time range)  ferrous gluconate (FERGON) tablet 324 mg (has no administration in time range)  sterile water (preservative free) injection (2.1 mLs  Given 05/08/20 2342)  LORazepam (ATIVAN) tablet 2 mg (2 mg Oral Given 05/08/20 2327)  ziprasidone (GEODON) injection 20 mg (20 mg Intramuscular Given 05/08/20 2336)    ED Course  I have reviewed the triage vital signs and the nursing notes.  Pertinent labs & imaging results that were available during my care of the patient were reviewed by me and considered in my medical decision making (see chart for details).  Clinical Course as of May 09 544  Tue May 08, 2020  2300 While awaiting Geodon orders, staff was able to get patient moved back to TCU area and dressed out. He was cooperative in allowing me to assess an abrasion on his finger from where he was hitting the wall in the triage room. He is asking for a snack and Sprite. Will hold Geodon at this time since patient appears to respond to verbal redirection. Oral Ativan given.   [MM]  2301 Yelling heard down the hallway, after investigating the disturbance, found the patient screaming and yelling at staff. Staff attempted verbal redirection unsuccessfully. I attempted to redirect the patient's behavior by offering a sandwich and a beverage. This was successful for a couple of minutes; however, the patient then began screaming, yelling, and cursing at staff. I attempted verbal redirection again unsuccessfully with no change in the patient's behavior. At this time, I am concerned for staff safety given the patient's behavior. Geodon has been ordered. Will administer and reassess.    [MM]  2334 Called back to TCU by staff. Patient  was standing in the doorway cursing at yelling at staff. After unsuccessfully attempting oral medication and redirection with out improvement in patient's agitation, Geodon has been administered for staff safety.    [MM]  2337 While awaiting Geodon administration, patient is eating his sandwich and responding to internal stimuli. He suddenly starts yelling "Fuck Biden Fuck Biden" and slamming his fists on the tray table. Security and police at bedside.   [MM]  Wed May 09, 2020  0030 Patient recheck.  He is sleeping and resting comfortably with equal, even respirations.   [MM]    Clinical Course User Index [MM] Shantese Raven, Coral Else, PA-C   MDM Rules/Calculators/A&P                          29 year old male with a h/o of ADHD, Bipolar 1 disorder, schizophrenia, HTN, obesity, intellectual disabilities, MS, and chronic pain who presents the emergency department with police under IVC.  A first examination was completed by Dr. Read Drivers, attending physician.  Initially, patient was calm and cooperative, but then became unable to be verbally redirected. He began hitting himself in the head and the wall and sustained an abrasion to his finger, which was treated with a bandage.  Agitation improved with Geodon. His labs are reassuring and at his baseline.  He is medically cleared at this time.  Pt medically cleared at this time. Psych hold orders and home med orders placed. TTS consult pending; please see psych team notes for further documentation of care/dispo. Pt stable at time of med clearance.     Final Clinical Impression(s) / ED Diagnoses Final diagnoses:  Auditory hallucinations  Agitation  Disorganized schizophrenia (HCC)  Rx / DC Orders ED Discharge Orders    None       Barkley Boards, PA-C 05/09/20 0545    Molpus, Jonny Ruiz, MD 05/09/20 [phone]

## 2020-05-09 DIAGNOSIS — F201 Disorganized schizophrenia: Secondary | ICD-10-CM | POA: Diagnosis not present

## 2020-05-09 DIAGNOSIS — F250 Schizoaffective disorder, bipolar type: Secondary | ICD-10-CM

## 2020-05-09 DIAGNOSIS — R440 Auditory hallucinations: Secondary | ICD-10-CM | POA: Diagnosis not present

## 2020-05-09 LAB — COMPREHENSIVE METABOLIC PANEL
ALT: 42 U/L (ref 0–44)
AST: 20 U/L (ref 15–41)
Albumin: 4.3 g/dL (ref 3.5–5.0)
Alkaline Phosphatase: 78 U/L (ref 38–126)
Anion gap: 8 (ref 5–15)
BUN: 16 mg/dL (ref 6–20)
CO2: 25 mmol/L (ref 22–32)
Calcium: 9.1 mg/dL (ref 8.9–10.3)
Chloride: 105 mmol/L (ref 98–111)
Creatinine, Ser: 0.99 mg/dL (ref 0.61–1.24)
GFR, Estimated: >60 mL/min (ref >60)
Glucose, Bld: 103 mg/dL — ABNORMAL HIGH (ref 70–99)
Potassium: 3.9 mmol/L (ref 3.5–5.1)
Sodium: 138 mmol/L (ref 135–145)
Total Bilirubin: 0.3 mg/dL (ref 0.3–1.2)
Total Protein: 7 g/dL (ref 6.5–8.1)

## 2020-05-09 LAB — RAPID URINE DRUG SCREEN, HOSP PERFORMED
Amphetamines: NOT DETECTED
Barbiturates: NOT DETECTED
Benzodiazepines: POSITIVE — AB
Cocaine: NOT DETECTED
Opiates: NOT DETECTED
Tetrahydrocannabinol: NOT DETECTED

## 2020-05-09 LAB — RESPIRATORY PANEL BY RT PCR (FLU A&B, COVID)
Influenza A by PCR: NEGATIVE
Influenza B by PCR: NEGATIVE
SARS Coronavirus 2 by RT PCR: NEGATIVE

## 2020-05-09 MED ORDER — FERROUS GLUCONATE 324 (38 FE) MG PO TABS
324.0000 mg | ORAL_TABLET | Freq: Every day | ORAL | Status: DC
  Administered 2020-05-09: 324 mg via ORAL
  Filled 2020-05-09: qty 1

## 2020-05-09 MED ORDER — ZIPRASIDONE MESYLATE 20 MG IM SOLR: 20.0000 mg | Freq: Once | INTRAMUSCULAR | Status: DC

## 2020-05-09 MED ORDER — LATANOPROST 0.005 % OP SOLN
1.0000 [drp] | Freq: Every day | OPHTHALMIC | Status: DC
  Filled 2020-05-09: qty 2.5

## 2020-05-09 MED ORDER — AMLODIPINE BESYLATE 5 MG PO TABS
10.0000 mg | ORAL_TABLET | Freq: Every day | ORAL | Status: DC
  Administered 2020-05-09: 10 mg via ORAL
  Filled 2020-05-09: qty 2

## 2020-05-09 MED ORDER — HALOPERIDOL 5 MG PO TABS: 10.0000 mg | ORAL_TABLET | Freq: Every day | ORAL | Status: DC

## 2020-05-09 MED ORDER — HALOPERIDOL DECANOATE 100 MG/ML IM SOLN
100.0000 mg | Freq: Once | INTRAMUSCULAR | Status: AC
  Administered 2020-05-09: 100 mg via INTRAMUSCULAR
  Filled 2020-05-09: qty 1

## 2020-05-09 MED ORDER — TEMAZEPAM 15 MG PO CAPS: 30.0000 mg | ORAL_CAPSULE | Freq: Every day | ORAL | Status: DC

## 2020-05-09 MED ORDER — APIXABAN 5 MG PO TABS
5.0000 mg | ORAL_TABLET | Freq: Two times a day (BID) | ORAL | Status: DC
  Administered 2020-05-09: 5 mg via ORAL
  Filled 2020-05-09: qty 1

## 2020-05-09 MED ORDER — METFORMIN HCL ER 500 MG PO TB24
500.0000 mg | ORAL_TABLET | Freq: Every day | ORAL | Status: DC
  Administered 2020-05-09: 500 mg via ORAL
  Filled 2020-05-09: qty 1

## 2020-05-09 MED ORDER — DOCUSATE SODIUM 100 MG PO CAPS: 400.0000 mg | ORAL_CAPSULE | Freq: Every day | ORAL | Status: DC

## 2020-05-09 MED ORDER — HALOPERIDOL 5 MG PO TABS
5.0000 mg | ORAL_TABLET | Freq: Every morning | ORAL | Status: DC
  Administered 2020-05-09: 5 mg via ORAL
  Filled 2020-05-09: qty 1

## 2020-05-09 NOTE — ED Provider Notes (Signed)
Psychiatry has cleared for discharge.  He is not suicidal or psychotic at this time.   Pricilla Loveless, MD 05/09/20 1244

## 2020-05-09 NOTE — BH Assessment (Signed)
[facility] Assessment Progress Note  Per Shuvon Rankin, NP, this pt does not require psychiatric hospitalization at this time.  Pt presents under IVC initiated by pt's aunt and upheld by EDP Meriel Flavors, MD, which has been rescinded by Nelly Rout, MD.  Pt is psychiatrically cleared.  Per Shuvon, before discharge pt is to receive a Haldol Decanoate injection.  Pt is to keep his next scheduled appointment with Zena Amos, MD at [facility] on Monday, 05/14/2020 at 15:40.  This has been included in pt's discharge instructions.  At 11:30 this Clinical research associate called pt's mother/legal guardian, Markus Casten to notify her of disposition.  She agrees to plan.  She reports that she will not be able to pick pt [place] at [facility] and that he is not capable of navigating the local bus system.  He will therefore need transportation.  Shuvon has been asked to order a social work consult to facilitate transportation.  EDP Pricilla Loveless, MD and pt's nurse, Waynetta Sandy, have been notified.  Doylene Canning, MA Triage Specialist [phone]

## 2020-05-09 NOTE — ED Notes (Signed)
Pt DC off unit to home per provider. Pt alert, calm, cooperative, no s/s of distress.  DC information and belongings given to pt. Pt ambulatory off unit, escorted by NT. Pt transported by [employer].

## 2020-05-09 NOTE — Discharge Instructions (Signed)
For your behavioral health needs, you are advised to continue treatment with [facility].  Your next appointment with Zena Amos, MD is scheduled for Monday, May 14, 2020 at 3:40 pm:       [facility]      [address]      [place], [place] [address]      [phone]

## 2020-05-09 NOTE — ED Notes (Signed)
Pt belongings returned to pt. Pt dressed out in his clothes. Pt resting waiting to be dc.

## 2020-05-09 NOTE — Consult Note (Signed)
Telepsych Consultation   Reason for Consult:  IVC, auditory hallucinations Referring Physician:  Barkley Boards, PA-C Location of Patient: [facility] ED Location of Provider: Other: [facility]  Patient Identification: SHENANDOAH VANDERGRIFF MRN:  [id number] Principal Diagnosis: Schizoaffective disorder, bipolar type (HCC) Diagnosis:  Principal Problem:   Schizoaffective disorder, bipolar type (HCC) Active Problems:   Auditory hallucination   Total Time spent with patient: 30 minutes  Subjective:   CADDEN ELIZONDO is a 29 y.o. male admitted [facility] ED under IVC by mother with complaints of auditory hallucinations, hitting himself in head, and not taking his medications.    HPI:  [name], 29 y.o., male with a history of Disorganized schizophrenia, ADHD was seen via tele psych by this provider, consulted with Dr. Bronwen Betters; and primary outpatient psychiatrist Dr. Quintella Baton.  Patients chart also reviewed on 05/09/20.  Patient has had several ED, Urgent care visits in the last several weeks, with similar presentation on each visits.  Patient has had several medications changes.  Spoke with Evelene Croon and agreed that Haldol Decanoate would be a good mediation for patient.  Not sure if patient is taking medications at home as they are prescribed. Also states that patient has a follow up with her next week 11/8/021.  On evaluation MARX DOIG reports he was brought to the hospital by the police "Cause I had a outburst.  Because I was talking to myself out loud."  Patient reports that he always hears voices and the voices never go away.  Patient states that "I hear voices telling me to come over.  They are real people but I can't see the faces."  Patient states that the voices doesn't bother him unless he talks back to them and that is when the voices get loud.  Patient encouraged to ignore the voices if he could and not to talk back to get the voices upset, Patient just laughed.  Patient states that he is compliant with  his medications at home and that he has outpatient psychiatric services.  Patient asked how did he get back and forth to his appointments "I use the [employer]."  Discussed starting a long acting injectable (LAI) with patient and states that he is willing to LAI.  Patient also informed that when he is mad he will hit himself in the head or bang his head on wall.   During evaluation AERION BAGDASARIAN is sitting up in bed.  He is alert/oriented x 3; calm/cooperative throughout assessment; and mood is congruent with affect and patient has had no behavior outburst while in the ED; stating that he is ready to go home.  He does not appear to be responding to internal/external stimuli or delusional thoughts; although he is endorsing auditory hallucinations.  Patient denies suicidal/self-harm/homicidal ideation, and paranoia.  Patient answered question appropriately.  Patient agreed to taking Haldol Decanoate.     Past Psychiatric History: ADHD, Schizophrenia, Bipolar disorder  Risk to Self:  No Risk to Others:  Np Prior Inpatient Therapy:  Yes Prior Outpatient Therapy:  Yes  Past Medical History:  Past Medical History:  Diagnosis Date  . ADHD (attention deficit hyperactivity disorder)   . Bipolar 1 disorder (HCC)   . Chronic back pain   . Chronic constipation   . Chronic neck pain   . Hypertension   . Multiple sclerosis (HCC) 05/20/2013   left sided weakness, dysarthria  . Non-compliance   . Obesity   . Pulmonary embolism (HCC)   .  Schizophrenia (HCC)   . Stroke [facility])    left sided deficits - pt's mother denies this    Past Surgical History:  Procedure Laterality Date  . NO PAST SURGERIES    . None    . TOOTH EXTRACTION N/A 06/24/2019   Procedure: DENTAL RESTORATION/EXTRACTION OF TEETH NUMBER ONE, SIXTEEN, SEVENTEEN, NINETEEN, THIRTY-TWO;  Surgeon: Ocie Doyne, DDS;  Location: [facility] OR;  Service: Oral Surgery;  Laterality: N/A;   Family History:  Family History  Problem Relation Age of  Onset  . Diabetes Mother   . ADD / ADHD Brother    Family Psychiatric  History: See above, patient unaware Social History:  Social History   Substance and Sexual Activity  Alcohol Use Not Currently  . Alcohol/week: 0.0 standard drinks   Comment: "A little bit"      Social History   Substance and Sexual Activity  Drug Use Not Currently  . Types: Marijuana   Comment: Last used: unknown     Social History   Socioeconomic History  . Marital status: Single    Spouse name: Not on file  . Number of children: 0  . Years of education: 11th  . Highest education level: Not on file  Occupational History  . 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 Smoking status: Current Every Day Smoker    Packs/day: 0.25    Types: Cigarettes  . Smokeless tobacco: Never Used  . Tobacco comment: 2 cigarettes a day  Vaping Use  . Vaping Use: Never used  Substance and Sexual Activity  . Alcohol use: Not Currently    Alcohol/week: 0.0 standard drinks    Comment: "A little bit"   . Drug use: Not Currently    Types: Marijuana    Comment: Last used: unknown   . Sexual activity: Not on file  Other Topics Concern  . Not on file  Social History Narrative   Patient lives at home with his mother.   Disabled.   Education 11 th grade .   Right handed.   Caffeine - one cup daily soda.   Social Determinants of Health   Financial Resource Strain:   . Difficulty of Paying Living Expenses: Not on file  Food Insecurity:   . Worried About Programme researcher, broadcasting/film/video in the Last Year: Not on file  . Ran Out of Food in the Last Year: Not on file  Transportation Needs:   . Lack of Transportation (Medical): Not on file  . Lack of Transportation (Non-Medical): Not on file  Physical Activity:   . Days of Exercise per Week: Not on file  . Minutes of Exercise per Session: Not on file  Stress:   . Feeling of Stress : Not on file  Social Connections:   . Frequency  of Communication with Friends and Family: Not on file  . Frequency of Social Gatherings with Friends and Family: Not on file  . Attends Religious Services: Not on file  . Active Member of Clubs or Organizations: Not on file  . Attends Banker Meetings: Not on file  . Marital Status: Not on file   Additional Social History:    Allergies:  No Known Allergies  Labs:  Results for orders placed or performed during the hospital encounter of 05/08/20 (from the past 48 hour(s))  Comprehensive metabolic panel     Status: Abnormal   Collection Time: 05/08/20 10:56 PM  Result Value  Ref Range   Sodium 138 135 - 145 mmol/L   Potassium 3.9 3.5 - 5.1 mmol/L   Chloride 105 98 - 111 mmol/L   CO2 25 22 - 32 mmol/L   Glucose, Bld 103 (H) 70 - 99 mg/dL    Comment: Glucose reference range applies only to samples taken after fasting for at least 8 hours.   BUN 16 6 - 20 mg/dL   Creatinine, Ser 1.61 0.61 - 1.24 mg/dL   Calcium 9.1 8.9 - 09.6 mg/dL   Total Protein 7.0 6.5 - 8.1 g/dL   Albumin 4.3 3.5 - 5.0 g/dL   AST 20 15 - 41 U/L   ALT 42 0 - 44 U/L   Alkaline Phosphatase 78 38 - 126 U/L   Total Bilirubin 0.3 0.3 - 1.2 mg/dL   GFR, Estimated >04 >54 mL/min    Comment: (NOTE) Calculated using the CKD-EPI Creatinine Equation (2021)    Anion gap 8 5 - 15    Comment: Performed at [facility], [address], [place], [place] [address]  Ethanol     Status: None   Collection Time: 05/08/20 10:56 PM  Result Value Ref Range   Alcohol, Ethyl (B) <10 <10 mg/dL    Comment: (NOTE) Lowest detectable limit for serum alcohol is 10 mg/dL.  For medical purposes only. Performed at [facility], [address], [place], [place] [address]   CBC with Diff     Status: Abnormal   Collection Time: 05/08/20 10:56 PM  Result Value Ref Range   WBC 7.8 4.0 - 10.5 K/uL   RBC 4.85 4.22 - 5.81 MIL/uL   Hemoglobin 12.6 (L) 13.0 - 17.0 g/dL   HCT 29.5 39 - 52 %    MCV 84.9 80.0 - 100.0 fL   MCH 26.0 26.0 - 34.0 pg   MCHC 30.6 30.0 - 36.0 g/dL   RDW 62.1 30.8 - 65.7 %   Platelets 180 150 - 400 K/uL   nRBC 0.0 0.0 - 0.2 %   Neutrophils Relative % 50 %   Neutro Abs 4.0 1.7 - 7.7 K/uL   Lymphocytes Relative 31 %   Lymphs Abs 2.4 0.7 - 4.0 K/uL   Monocytes Relative 10 %   Monocytes Absolute 0.7 0.1 - 1.0 K/uL   Eosinophils Relative 7 %   Eosinophils Absolute 0.6 (H) 0.0 - 0.5 K/uL   Basophils Relative 1 %   Basophils Absolute 0.1 0.0 - 0.1 K/uL   Immature Granulocytes 1 %   Abs Immature Granulocytes 0.05 0.00 - 0.07 K/uL    Comment: Performed at [facility], [address] [place], [place] [address]  Acetaminophen level     Status: Abnormal   Collection Time: 05/08/20 10:56 PM  Result Value Ref Range   Acetaminophen (Tylenol), Serum <10 (L) 10 - 30 ug/mL    Comment: (NOTE) Therapeutic concentrations vary significantly. A range of 10-30 ug/mL  may be an effective concentration for many patients. However, some  are best treated at concentrations outside of this range. Acetaminophen concentrations >150 ug/mL at 4 hours after ingestion  and >50 ug/mL at 12 hours after ingestion are often associated with  toxic reactions.  Performed at [facility], [address] [place], [place] [address]   Salicylate level     Status: Abnormal   Collection Time: 05/08/20 10:56 PM  Result Value Ref Range   Salicylate Lvl <7.0 (L) 7.0 - 30.0 mg/dL  Comment: Performed at [facility], [address], [place], [place] [address]  Urine rapid drug screen (hosp performed)     Status: Abnormal   Collection Time: 05/09/20  6:03 AM  Result Value Ref Range   Opiates NONE DETECTED NONE DETECTED   Cocaine NONE DETECTED NONE DETECTED   Benzodiazepines POSITIVE (A) NONE DETECTED   Amphetamines NONE DETECTED NONE DETECTED   Tetrahydrocannabinol NONE DETECTED NONE DETECTED   Barbiturates NONE DETECTED NONE  DETECTED    Comment: (NOTE) DRUG SCREEN FOR MEDICAL PURPOSES ONLY.  IF CONFIRMATION IS NEEDED FOR ANY PURPOSE, NOTIFY LAB WITHIN 5 DAYS.  LOWEST DETECTABLE LIMITS FOR URINE DRUG SCREEN Drug Class                     Cutoff (ng/mL) Amphetamine and metabolites    1000 Barbiturate and metabolites    200 Benzodiazepine                 200 Tricyclics and metabolites     300 Opiates and metabolites        300 Cocaine and metabolites        300 THC                            50 Performed at [facility], [address], [place], [place] [address]   Respiratory Panel by RT PCR (Flu A&B, Covid) - Nasopharyngeal Swab     Status: None   Collection Time: 05/09/20  6:28 AM   Specimen: Nasopharyngeal Swab  Result Value Ref Range   SARS Coronavirus 2 by RT PCR NEGATIVE NEGATIVE    Comment: (NOTE) SARS-CoV-2 target nucleic acids are NOT DETECTED.  The SARS-CoV-2 RNA is generally detectable in upper respiratoy specimens during the acute phase of infection. The lowest concentration of SARS-CoV-2 viral copies this assay can detect is 131 copies/mL. A negative result does not preclude SARS-Cov-2 infection and should not be used as the sole basis for treatment or other patient management decisions. A negative result may occur with  improper specimen collection/handling, submission of specimen other than nasopharyngeal swab, presence of viral mutation(s) within the areas targeted by this assay, and inadequate number of viral copies (<131 copies/mL). A negative result must be combined with clinical observations, patient history, and epidemiological information. The expected result is Negative.  Fact Sheet for Patients:  https://www.moore.com/  Fact Sheet for Healthcare Providers:  https://www.young.biz/  This test is no t yet approved or cleared by the [place] FDA and  has been authorized for detection and/or diagnosis of  SARS-CoV-2 by FDA under an Emergency Use Authorization (EUA). This EUA will remain  in effect (meaning this test can be used) for the duration of the COVID-19 declaration under Section 564(b)(1) of the Act, 21 U.S.C. section 360bbb-3(b)(1), unless the authorization is terminated or revoked sooner.     Influenza A by PCR NEGATIVE NEGATIVE   Influenza B by PCR NEGATIVE NEGATIVE    Comment: (NOTE) The Xpert Xpress SARS-CoV-2/FLU/RSV assay is intended as an aid in  the diagnosis of influenza from Nasopharyngeal swab specimens and  should not be used as a sole basis for treatment. Nasal washings and  aspirates are unacceptable for Xpert Xpress SARS-CoV-2/FLU/RSV  testing.  Fact Sheet for Patients: https://www.moore.com/  Fact Sheet for Healthcare Providers: https://www.young.biz/  This test is not yet approved or cleared by the [place] and  has been authorized  for detection and/or diagnosis of SARS-CoV-2 by  FDA under an Emergency Use Authorization (EUA). This EUA will remain  in effect (meaning this test can be used) for the duration of the  Covid-19 declaration under Section 564(b)(1) of the Act, 21  U.S.C. section 360bbb-3(b)(1), unless the authorization is  terminated or revoked. Performed at [facility], [address] [place], [place] [address]     Medications:  Current Facility-Administered Medications  Medication Dose Route Frequency Provider Last Rate Last Admin  . amLODipine (NORVASC) tablet 10 mg  10 mg Oral Daily McDonald, Mia A, PA-C   10 mg at 05/09/20 0935  . apixaban (ELIQUIS) tablet 5 mg  5 mg Oral BID McDonald, Mia A, PA-C   5 mg at 05/09/20 0935  . docusate sodium (COLACE) capsule 400 mg  400 mg Oral QHS McDonald, Mia A, PA-C      . ferrous gluconate (FERGON) tablet 324 mg  324 mg Oral Daily McDonald, Mia A, PA-C   324 mg at 05/09/20 0936  . haloperidol (HALDOL) tablet 10 mg  10 mg Oral QHS  McDonald, Mia A, PA-C      . haloperidol (HALDOL) tablet 5 mg  5 mg Oral q morning - 10a McDonald, Mia A, PA-C   5 mg at 05/09/20 0935  . haloperidol decanoate (HALDOL DECANOATE) 100 MG/ML injection 100 mg  100 mg Intramuscular Once Betsie Peckman B, NP      . latanoprost (XALATAN) 0.005 % ophthalmic solution 1 drop  1 drop Both Eyes QHS McDonald, Mia A, PA-C      . metFORMIN (GLUCOPHAGE-XR) 24 hr tablet 500 mg  500 mg Oral Q breakfast McDonald, Mia A, PA-C   500 mg at 05/09/20 0935  . temazepam (RESTORIL) capsule 30 mg  30 mg Oral QHS McDonald, Mia A, PA-C       Current Outpatient Medications  Medication Sig Dispense Refill  . amLODipine (NORVASC) 10 MG tablet Take 10 mg by mouth daily.    Marland Kitchen apixaban (ELIQUIS) 5 MG TABS tablet Take 5 mg by mouth 2 (two) times daily.     . Calcium Citrate-Vitamin D (CALCIUM + D PO) Take 1 tablet by mouth daily.    Marland Kitchen docusate sodium (COLACE) 100 MG capsule Take 400 mg by mouth at bedtime.     . ferrous gluconate (IRON 27) 240 (27 FE) MG tablet Take 240 mg by mouth daily.    . haloperidol (HALDOL) 10 MG tablet Take 1 tablet (10 mg total) by mouth at bedtime. 30 tablet 0  . haloperidol (HALDOL) 5 MG tablet Take 1 tablet (5 mg total) by mouth every morning. 30 tablet 0  . latanoprost (XALATAN) 0.005 % ophthalmic solution Place 1 drop into both eyes at bedtime.    . Melatonin 10 MG TABS Take 10 mg by mouth at bedtime. 30 tablet 0  . metFORMIN (GLUCOPHAGE-XR) 500 MG 24 hr tablet Take 500 mg by mouth daily with breakfast.    . Multiple Vitamin (MULTIVITAMIN WITH MINERALS) TABS tablet Take 1 tablet by mouth daily.    Marland Kitchen ocrelizumab in sodium chloride 0.9 % 500 mL CHANGE ::  Ocrevus 600mg  IV EVERY 8 (EIGHT) months. 1 each 2  . temazepam (RESTORIL) 30 MG capsule Take 1 capsule (30 mg total) by mouth at bedtime. 30 capsule 0    Musculoskeletal: Strength & Muscle Tone: within normal limits Gait & Station: normal Patient leans: N/A  Psychiatric Specialty  Exam: Physical Exam Constitutional:  General: He is not in acute distress.    Appearance: He is obese. He is not ill-appearing.  Pulmonary:     Effort: Pulmonary effort is normal.  Musculoskeletal:        General: Normal range of motion.     Cervical back: Normal range of motion.  Neurological:     Mental Status: He is alert and oriented to person, place, and time.  Psychiatric:        Attention and Perception: Attention normal. He perceives auditory hallucinations. He does not perceive visual hallucinations.        Mood and Affect: Mood and affect normal.        Speech: Speech normal.        Behavior: Behavior normal. Behavior is cooperative.        Thought Content: Thought content is not paranoid or delusional. Thought content does not include homicidal or suicidal ideation.        Cognition and Memory: Memory normal.        Judgment: Judgment is impulsive.     Review of Systems  Neurological:       Multiple sclerosis  Psychiatric/Behavioral: Positive for hallucinations (Auditory). Negative for agitation. Self-injury: Bang head when mad  Sleep disturbance: Fair. Suicidal ideas: Denies. The patient is not nervous/anxious.        Patient states that he always hear voices they never go away.  States that currently voices are whispers.  Patient asked what makes the voices loud "When I talk crap to them"  All other systems reviewed and are negative.   Blood pressure (!) 140/95, pulse 82, temperature 97.6 F (36.4 C), temperature source Oral, resp. rate 19, height 6\' 1"  (1.854 m), weight 136.1 kg, SpO2 100 %.Body mass index is 39.58 kg/m.  General Appearance: Casual  Eye Contact:  Good  Speech:  Clear and Coherent and Normal Rate  Volume:  Normal  Mood:  "Good"  Affect:  Appropriate and Congruent  Thought Process:  Coherent, Goal Directed and Descriptions of Associations: Tangential  Orientation:  Full (Time, Place, and Person)  Thought Content:  WDL and Hallucinations:  Auditory  Suicidal Thoughts:  No  Homicidal Thoughts:  No  Memory:  Immediate;   Good Recent;   Good  Judgement:  Intact  Insight:  Present  Psychomotor Activity:  Normal  Concentration:  Concentration: Fair and Attention Span: Fair  Recall:  Good  Fund of Knowledge:  Fair  Language:  Fair  Akathisia:  No  Handed:  Right  AIMS (if indicated):     Assets:  Communication Skills Desire for Improvement Housing Resilience Social Support  ADL's:  Intact  Cognition:  WNL  Sleep:        Treatment Plan Summary: Plan Psychiatrically clear to follow up with outpatient provider  Dr. Evelene Croon Recommends continue current psychotropic medications add Haldol Decanoate and she will follow up with patient in one week on 11/8/202.  Ordered Haldol Decanoate 100 mg IM Q 30 days Continue Haldol 10 mg Q hs, 5 mg Q morning Restoril 30 mg Q hs  Doylene Canning spoke to patients mother and she agrees with plan and okay for patient to come home.  States that patient unable to navigate bus and will need another form of transportation home.      Discharge Instructions     For your behavioral health needs, you are advised to continue treatment with [facility].  Your next appointment with Zena Amos, MD is scheduled for  Monday, May 14, 2020 at 3:40 pm:       [facility]      [address]      [place], [place] [address]      [phone]     Disposition:  Psychiatrically cleared No evidence of imminent risk to self or others at present.   Patient does not meet criteria for psychiatric inpatient admission. Supportive therapy provided about ongoing stressors. Discussed crisis plan, support from social network, calling 911, coming to the Emergency Department, and calling Suicide Hotline.  This service was provided via telemedicine using a 2-way, interactive audio and video technology.  Names of all persons participating in this telemedicine service and  their role in this encounter. Name: Assunta Found Role: NP  Name: Dr. Bronwen Betters Role: Psychiatrist  Name: Dr. Quintella Baton Role: Primary psychiatrist  Name: [name] Role: Patient    Assunta Found, NP 05/09/2020 11:08 AM

## 2020-05-09 NOTE — ED Notes (Addendum)
Pt meal tray arrived. Pt sleeping at this time.

## 2020-05-09 NOTE — TOC Initial Note (Addendum)
Transition of Care [facility]) - Initial/Assessment Note    Patient Details  Name: MERCY MALENA MRN: [id number] Date of Birth: 12-18-1990  Transition [facility]) CM/SW Contact:    Elliot Cousin, RN Phone Number: [phone] 05/09/2020, 12:35 PM  Clinical Narrative:                  Per TTS counselor, mother is in agreement to accept pt back to the home. Mother is requesting transportation to their home for pt. TOC CM had pt review and sign transportation waiver. Scanned to [employer]. Copy provider to pt. Pt was able to provide address. ED RN will call transport when pt is ready for dc. Pt is scheduled to go back to his mother's home.    Barriers to Discharge: No Barriers Identified   Patient Goals and CMS Choice        Expected Discharge Plan and Services                                                Prior Living Arrangements/Services                       Activities of Daily Living      Permission Sought/Granted                  Emotional Assessment              Admission diagnosis:  IVC Patient Active Problem List   Diagnosis Date Noted  . Unspecified intellectual disabilities 01/27/2020  . Migraine headache 04/18/2019  . Generalized weakness   . Pulmonary embolism (HCC) 08/12/2017  . Hyperglycemia 07/31/2017  . Multiple sclerosis exacerbation (HCC) 07/30/2017  . Acute encephalopathy 07/30/2017  . Schizoaffective disorder, bipolar type (HCC)   . Disorganized schizophrenia (HCC)   . Hallucinations 08/05/2014  . Auditory hallucination   . Left-sided weakness 01/18/2014  . Multiple sclerosis (HCC) 05/20/2013  . 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 Abnormality of gait 11/23/2012  . HTN (hypertension) 07/04/2011  . 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 Obesity, Class III, BMI 40-49.9 (morbid obesity) (HCC) 01/14/2010  . ADHD 01/14/2010   PCP:  Maudie Flakes, FNP Pharmacy:   [facility] -  [place], [place] - [address] [facility] [address] [place] [address] Phone: [phone] Fax: [phone]  [id number] - [place], [place] - [address] [place] [address] Phone: [phone] Fax: [phone]     Social Determinants of Health (SDOH) Interventions    Readmission Risk Interventions No flowsheet data found.

## 2020-05-09 NOTE — ED Notes (Signed)
Sam with TTS called and stated pt would not wake up to participate in session. Please contact Sam if patient wakes up [phone]

## 2020-05-09 NOTE — BH Assessment (Signed)
Contacted pt's nurse in an attempt to complete pt's BH Assessment/CCA. Pt had been given Geodon and, despite pt's nurse's best efforts, pt was unable to be woken up. Clinician provided her phone number and requested she be contacted if pt wakes up and his assessment can be completed; pt's nurse agreed to do so.

## 2020-05-09 NOTE — ED Notes (Signed)
Pt finished breakfast tray 100%. Pt walked to restroom when finished to "stretch legs." Pt noticeably soiled when pt stood. Pt given new purple scrubs and a complete linen change was performed. Pt now laying back in bed.

## 2020-05-14 ENCOUNTER — Telehealth (INDEPENDENT_AMBULATORY_CARE_PROVIDER_SITE_OTHER): Payer: Medicare Other | Admitting: Psychiatry

## 2020-05-14 ENCOUNTER — Encounter (HOSPITAL_COMMUNITY): Payer: Self-pay | Admitting: Psychiatry

## 2020-05-14 ENCOUNTER — Other Ambulatory Visit: Payer: Self-pay

## 2020-05-14 DIAGNOSIS — F201 Disorganized schizophrenia: Secondary | ICD-10-CM | POA: Diagnosis not present

## 2020-05-14 DIAGNOSIS — F79 Unspecified intellectual disabilities: Secondary | ICD-10-CM

## 2020-05-14 MED ORDER — ASENAPINE MALEATE 10 MG SL SUBL: 10.0000 mg | SUBLINGUAL_TABLET | Freq: Two times a day (BID) | SUBLINGUAL | 1 refills | Status: DC

## 2020-05-14 NOTE — Progress Notes (Addendum)
BH MD/PA/NP OP Progress Note  Virtual Visit via Telephone Note  I connected with Brandon [name] on 05/14/20 at  3:40 PM EST by telephone and verified that I am speaking with the correct person using two identifiers.  Location: Patient: home Provider: Clinic   I discussed the limitations, risks, security and privacy concerns of performing an evaluation and management service by telephone and the availability of in person appointments. I also discussed with the patient that there may be a patient responsible charge related to this service. The patient expressed understanding and agreed to proceed.   I provided 17 minutes of non-face-to-face time during this encounter.    05/14/2020 3:56 PM Brandon [name]  MRN:  [id number]  Chief Complaint: As per mom, " He still not sleeping."   HPI: Mom informed that patient is still not sleeping and still has auditory hallucinations.  Patient was seen by the writer on October 25 as a walk-in for ongoing ulcerations and poor sleep.  Writer provided the mother with samples for Rexulti however a few days after that patient had a severe aggressive outburst at home and he was brought in at [facility] crisis center October 28.  He was prescribed oral Haldol which seemed to help his admission and the hallucinations.  He was cleared for discharge. However he returned back to the crisis center on November 2 after another outburst. He had to be transferred to [facility] long ED on [employer] petition.  There he received IM Haldol decanoate 100 mg in consultation with psychiatry.  He was cleared for discharge back to his mother.  Today, mother reported that he is pretty much the same.  She stated that she feels that the dose of medicines that they gave him in the hospital was not enough.  She stated that due to his large size he should receive a higher dose of all the medicines.  She informed that she has been giving him the Haldol medicine  however she is not sure if that helps him much with the voices. She stated that he is still walking back and forth and goes to the front door repeatedly.  He hardly sleeps.  She stated that he got very agitated yesterday and broke the middle of the back. She believes that the department 4 days ago the erectile because of his aggressive behaviors as already presented complaining repeatedly. She stated that she has been giving him 3 tablets of the temazepam 30 mg to help him sleep.  Writer informed her that was a very high dose and she should avoid doing that.  Writer recommended a trial of Saphris.  Writer explained to mother that it is an atypical antipsychotic that will help with hallucinations and also due to its sedating properties will help him sleep.  Writer recommended starting off with 10 mg at bedtime and if that is not adequate she can give him 2 tablets together at bedtime to see if that would help him sleep. Mother stated that she still would like for him to receive the IM Haldol Decanoate injection however requested a higher dose which the Clinical research associate agrees with.  Writer recommended that she brings him for his next visit to the clinic in early December so that he can receive his next dose of IM Haldol Decanoate.  Brandon [name] spoke briefly with the Clinical research associate however did not offer much information.   Past meds tried-risperidone, Invega, Abilify, olanzapine, doxepin, trazodone.   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 ADHD (attention deficit hyperactivity disorder)   . Bipolar 1 disorder (HCC)   . Chronic back pain   . Chronic constipation   . Chronic neck pain   . Hypertension   . Multiple sclerosis (HCC) 05/20/2013   left sided  weakness, dysarthria  . Non-compliance   . Obesity   . Pulmonary embolism (HCC)   . Schizophrenia (HCC)   . Stroke [facility])    left sided deficits - pt's mother denies this    Past Surgical History:  Procedure Laterality Date  . NO PAST SURGERIES    . None    . TOOTH EXTRACTION N/A 06/24/2019   Procedure: DENTAL RESTORATION/EXTRACTION OF TEETH NUMBER ONE, SIXTEEN, SEVENTEEN, NINETEEN, THIRTY-TWO;  Surgeon: Ocie Doyne, DDS;  Location: [facility] OR;  Service: Oral Surgery;  Laterality: N/A;    Family Psychiatric History: Brother- ADHD  Family History:  Family History  Problem Relation Age of Onset  . Diabetes Mother   . ADD / ADHD Brother     Social History:  Social History   Socioeconomic History  . Marital status: Single    Spouse name: Not on file  . Number of children: 0  . Years of education: 11th  . Highest education level: Not on file  Occupational History  . 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 Smoking status: Current Every Day Smoker    Packs/day: 0.25    Types: Cigarettes  . Smokeless tobacco: Never Used  . Tobacco comment: 2 cigarettes a day  Vaping Use  . Vaping Use: Never used  Substance and Sexual Activity  . Alcohol use: Not Currently    Alcohol/week: 0.0 standard drinks    Comment: "A little bit"   . Drug use: Not Currently    Types: Marijuana    Comment: Last used: unknown   . Sexual activity: Not on file  Other Topics Concern  . Not on file  Social History Narrative   Patient lives at home with his mother.   Disabled.   Education 11 th grade .   Right handed.   Caffeine - one cup daily soda.   Social Determinants of Health   Financial Resource Strain:   . Difficulty of Paying Living Expenses: Not on file  Food Insecurity:   . Worried About Programme researcher, broadcasting/film/video in the Last Year: Not on file  . Ran Out of Food in the Last Year: Not on file  Transportation Needs:   . Lack of Transportation  (Medical): Not on file  . Lack of Transportation (Non-Medical): Not on file  Physical Activity:   . Days of Exercise per Week: Not on file  . Minutes of Exercise per Session: Not on file  Stress:   . Feeling of Stress : Not on file  Social Connections:   . Frequency of Communication with Friends and Family: Not on file  . Frequency of Social Gatherings with Friends and Family: Not on file  . Attends Religious Services: Not on file  . Active Member of Clubs or Organizations: Not on file  . Attends Banker Meetings: Not on file  . Marital Status: Not on file    Allergies:  No Known Allergies  Metabolic Disorder Labs: Lab Results  Component Value Date   HGBA1C 5.9 (H) 04/30/2020   MPG 123 04/30/2020   MPG 122.63 07/31/2017   Lab Results  Component Value Date   PROLACTIN 13.6 04/30/2020   Lab Results  Component Value Date   CHOL 141 04/30/2020   TRIG 134 04/30/2020   HDL 37 (L) 04/30/2020   CHOLHDL 3.8 04/30/2020   VLDL 27 04/30/2020   LDLCALC 77 04/30/2020   LDLCALC 70 01/29/2011   Lab Results  Component Value Date   TSH 4.628 (H) 04/30/2020   TSH 0.848 01/18/2014    Therapeutic Level Labs: Lab Results  Component Value Date   LITHIUM <0.06 (L) 01/25/2018   LITHIUM <0.06 (L) 04/20/2015   Lab Results  Component Value Date   VALPROATE <10 (L) 07/30/2017   VALPROATE 31 (L) 07/24/2017   No components found for:  CBMZ  Current Medications: Current Outpatient Medications  Medication Sig Dispense Refill  . amLODipine (NORVASC) 10 MG tablet Take 10 mg by mouth daily.    Marland Kitchen apixaban (ELIQUIS) 5 MG TABS tablet Take 5 mg by mouth 2 (two) times daily.     . Asenapine Maleate 10 MG SUBL Place 1 tablet (10 mg total) under the tongue in the morning and at bedtime. 60 tablet 1  . Calcium Citrate-Vitamin D (CALCIUM + D PO) Take 1 tablet by mouth daily.    Marland Kitchen docusate sodium (COLACE) 100 MG capsule Take 400 mg by mouth at bedtime.     . ferrous gluconate (IRON  27) 240 (27 FE) MG tablet Take 240 mg by mouth daily.    . haloperidol (HALDOL) 10 MG tablet Take 1 tablet (10 mg total) by mouth at bedtime. 30 tablet 0  . haloperidol (HALDOL) 5 MG tablet Take 1 tablet (5 mg total) by mouth every morning. 30 tablet 0  . latanoprost (XALATAN) 0.005 % ophthalmic solution Place 1 drop into both eyes at bedtime.    . Melatonin 10 MG TABS Take 10 mg by mouth at bedtime. 30 tablet 0  . metFORMIN (GLUCOPHAGE-XR) 500 MG 24 hr tablet Take 500 mg by mouth daily with breakfast.    . Multiple Vitamin (MULTIVITAMIN WITH MINERALS) TABS tablet Take 1 tablet by mouth daily.    Marland Kitchen ocrelizumab in sodium chloride 0.9 % 500 mL CHANGE ::  Ocrevus 600mg  IV EVERY 8 (EIGHT) months. 1 each 2  . temazepam (RESTORIL) 30 MG capsule Take 1 capsule (30 mg total) by mouth at bedtime. 30 capsule 0   No current facility-administered medications for this visit.      Psychiatric Specialty Exam: Review of Systems  There were no vitals taken for this visit.There is no height or weight on file to calculate BMI.  General Appearance: Fairly groomed  Eye Contact:  Fair  Speech:  Slow and monotonus  Volume:  Decreased  Mood:  Anxious  Affect:  Restricted  Thought Process:  Goal Directed and Descriptions of Associations: Intact, Responding to internal stimuli, mumbling to self intermittently  Orientation:  Full (Time, Place, and Person)  Thought Content: Illogical, Hallucinations: Auditory and Paranoid Ideation   Suicidal Thoughts:  No  Homicidal Thoughts:  No  Memory:  Immediate;   Good Recent;   Good  Judgement:  Fair  Insight:  Fair  Psychomotor Activity:  Decreased  Concentration:  Concentration: Good and Attention Span: Fair  Recall:  of Knowledge: Poor  Language: Good  Akathisia:  Negative  Handed:  Right  AIMS (if indicated): not done  Assets:  Communication Skills Desire for Improvement Financial Resources/Insurance Housing Social Support  ADL's:  Intact   Cognition: WNL  Sleep:  Poor     Assessment and Plan: Patient remains with hallucinations and agitation.  He has been seen several times in the last 2 weeks as a walk-in in the clinic and as well as as a patient and crisis at the crisis center.  Mother does not believe Haldol Decanoate is helping much however she has been giving him oral Haldol as prescribed.  She is agreeable to the recommendation of adding Saphris to augment the dose of antipsychotic Haldol. Potential side effects of medication and risks vs benefits of treatment vs non-treatment were explained and discussed. All questions were answered.   1. Disorganized schizophrenia (HCC)  - Start/Add Asenapine Maleate 10 MG SUBL; Place 1 tablet (10 mg total) under the tongue in the morning and at bedtime.  Dispense: 60 tablet; Refill: 1 - Continue PO Haldol 5 mg qam, 10 mg HS. - Received IM haldol Decanoate 100 mg on 05/09/20. - Plan is to increase the dose of IM Haldol decanoate to 200 mg at the time of his next due dose in early December.  2. Unspecified intellectual disabilities   F/up in 3 weeks. F/up scheduled for December 1 at 9:30 am.   Zena Amos, MD 05/14/2020, 3:56 PM   ADDENDUM:  Mother called next morning to inform that the pharmacy delivered [facility] prescription however they did not bring the prescription for Haldol which was called in by NP Feliz Beam on October 29.  Writer went ahead and sent in a prescription for Haldol 10 mg twice daily in addition to Cogentin 1 mg twice daily so the patient is medicines along with the Saphris that was just started yesterday.  Zena Amos, MD 05/15/2020 9:30 AM

## 2020-05-15 ENCOUNTER — Telehealth (HOSPITAL_COMMUNITY): Payer: Self-pay | Admitting: *Deleted

## 2020-05-15 MED ORDER — HALOPERIDOL 10 MG PO TABS
10.0000 mg | ORAL_TABLET | Freq: Two times a day (BID) | ORAL | 0 refills | Status: DC
Start: 2020-05-15 — End: 2020-06-04

## 2020-05-15 MED ORDER — BENZTROPINE MESYLATE 1 MG PO TABS: 1.0000 mg | ORAL_TABLET | Freq: Two times a day (BID) | ORAL | 1 refills | Status: DC

## 2020-05-15 NOTE — Telephone Encounter (Signed)
Okay, I am sending new Rx for Haldol 10 mg BID with Cogentin 1mg  BID for EPS prophylaxis to his pharmacy.

## 2020-05-15 NOTE — Telephone Encounter (Signed)
Received notice from [employer] that pt needs to return their calls for consent for ocrevus. Pt should call [phone] ext [phone]. I called pt, relayed this to his mother, legal guardian. She verbalized understanding.

## 2020-05-15 NOTE — Addendum Note (Signed)
Addended by: Zena Amos on: 05/15/2020 09:23 AM   Modules accepted: Orders

## 2020-05-15 NOTE — Telephone Encounter (Signed)
Call from patients mom that all of his medicine isnt available for him at [facility]. Called and spoke with the pharmacist who explained patient is mailed med packs and they used 10 days out of the Rx written by [employer] and there fore dont have enough of the Haldol to complete this months packs and are requesting a new rx for the two strengths of Haldol and the remaining 20 pills from the Rx written by [employer] will be discontinued. Will bring this to Dr Magdalen Spatz attention.

## 2020-05-29 ENCOUNTER — Other Ambulatory Visit (HOSPITAL_COMMUNITY): Payer: Self-pay | Admitting: Psychiatry

## 2020-06-06 ENCOUNTER — Ambulatory Visit (INDEPENDENT_AMBULATORY_CARE_PROVIDER_SITE_OTHER): Payer: Medicare Other | Admitting: *Deleted

## 2020-06-06 ENCOUNTER — Encounter (HOSPITAL_COMMUNITY): Payer: Self-pay | Admitting: Psychiatry

## 2020-06-06 ENCOUNTER — Other Ambulatory Visit: Payer: Self-pay

## 2020-06-06 ENCOUNTER — Ambulatory Visit (INDEPENDENT_AMBULATORY_CARE_PROVIDER_SITE_OTHER): Payer: Medicare Other | Admitting: Psychiatry

## 2020-06-06 ENCOUNTER — Encounter (HOSPITAL_COMMUNITY): Payer: Self-pay

## 2020-06-06 VITALS — BP 122/88 | HR 77 | Ht 73.0 in

## 2020-06-06 DIAGNOSIS — F201 Disorganized schizophrenia: Secondary | ICD-10-CM

## 2020-06-06 DIAGNOSIS — F79 Unspecified intellectual disabilities: Secondary | ICD-10-CM | POA: Diagnosis not present

## 2020-06-06 MED ORDER — HALOPERIDOL DECANOATE 100 MG/ML IM SOLN: 200.0000 mg | INTRAMUSCULAR | 6 refills | Status: DC

## 2020-06-06 MED ORDER — ASENAPINE MALEATE 10 MG SL SUBL: 10.0000 mg | SUBLINGUAL_TABLET | Freq: Three times a day (TID) | SUBLINGUAL | 1 refills | Status: DC

## 2020-06-06 MED ORDER — HALOPERIDOL 10 MG PO TABS: 10.0000 mg | ORAL_TABLET | Freq: Two times a day (BID) | ORAL | 1 refills | Status: DC

## 2020-06-06 MED ORDER — HALOPERIDOL DECANOATE 100 MG/ML IM SOLN
200.0000 mg | INTRAMUSCULAR | Status: DC
  Administered 2020-06-06 – 2020-09-06 (×3): 200 mg via INTRAMUSCULAR

## 2020-06-06 MED ORDER — BENZTROPINE MESYLATE 1 MG PO TABS: 1.0000 mg | ORAL_TABLET | Freq: Two times a day (BID) | ORAL | 1 refills | Status: DC

## 2020-06-06 NOTE — Progress Notes (Signed)
In for scheduled appt with Dr Evelene Croon for a med check and Mom stated she wanted him to get a shot and Dr Evelene Croon evaluated him for it and ordered Haldol D 200 mg IM now. Injection give in R deltoid without issue.He is mostly non verbal, laughs and has poor eye contact. He is a large man who has difficulty walking. He is heavier than our scale will weigh. He is pleasant with staff. Mom bribed him to come today by giving him $3.00. He took the shot without difficulty. He is to return in one month for his next injection and to bring his medicine the next visit as he has medicaid.

## 2020-06-06 NOTE — Progress Notes (Signed)
BH MD/PA/NP OP Progress Note   06/06/2020 9:55 AM Brandon [name]  MRN:  [id number]  Chief Complaint: " I am ok."   HPI: Patient seen with his mother.  Patient was noted to be internally preoccupied and mumbling to himself during the session.  His mother informed that adding Saphris has helped slightly.  She stated that he does fairly well in the mornings and during the groups that he attends.  The group therapist have not conducted the mother over the last few weeks ever since he was started on Saphris.  Prior to starting Saphris, the therapist had contacted the mother to inform her that patient was hitting himself in his head repeatedly and they had to redirect him several times. She stated that that has not happened ever since he started taking Saphris. Mom stated that he does really well when he comes home however around 6 PM it seems like the medicines wear off and he starts getting agitated.  He starts destroying things around that time and banging his head because he cannot control the voices.  Mom reported that she has started giving him his medications Saphris and Haldol at 6 PM.  She stated that it takes a while for the medicines to work and he is able to get some hours of sleep now.  However he is not sleeping throughout the night.  Sometimes he will stay up till 2 AM using his electronic devices.  [name] was asked specifically how he felt with the medications, he replied he still hearing voices.  When asked if he feels any better he replied not really. He stated that his stomach is going.  Mom elaborated that he feels that his stomach is being taken away and this is like a delusion for him.  Writer pointed out this is a nihilistic delusion seen in schizophrenia. Writer noted that as per EMR he was tried on clozapine back in 2019 during inpatient stay.  Writer asked the mother how he did back then and she reported that he still had voices and some paranoid delusions.  He was getting his  weekly blood draws for CBC done however the medicine did not seem to be any more effective than other options and therefore he was taken off of it.  Writer asked mother if she thinks adding a dose of Saphris in the afternoon would help to which she replied she thinks that would be a good idea. Writer also recommended going up on the dose of the Haldol Decanoate injection, he received 100 mg dose on November 3.  Mother agreed that he does need a higher dose of all the medicines due to his BMI.  Past meds tried- Risperidone, Invega, Abilify, Olanzapine, Clozapine (in 2019), Doxepin, Trazodone.   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 ADHD (attention deficit hyperactivity disorder)   . Bipolar 1 disorder (HCC)   . Chronic back pain   . Chronic constipation   . Chronic neck pain   . Hypertension   . Multiple sclerosis (HCC) 05/20/2013   left sided weakness, dysarthria  . Non-compliance   . Obesity   . Pulmonary embolism (HCC)   . Schizophrenia (HCC)   . Stroke [facility])    left sided deficits - pt's mother denies  this    Past Surgical History:  Procedure Laterality Date  . NO PAST SURGERIES    . None    . TOOTH EXTRACTION N/A 06/24/2019   Procedure: DENTAL RESTORATION/EXTRACTION OF TEETH NUMBER ONE, SIXTEEN, SEVENTEEN, NINETEEN, THIRTY-TWO;  Surgeon: Ocie Doyne, DDS;  Location: [facility] OR;  Service: Oral Surgery;  Laterality: N/A;    Family Psychiatric History: Brother- ADHD  Family History:  Family History  Problem Relation Age of Onset  . Diabetes Mother   . ADD / ADHD Brother     Social History:  Social History   Socioeconomic History  . Marital status: Single    Spouse name: Not on file  . Number of children: 0  . Years of education: 11th  .  Highest education level: Not on file  Occupational History  . 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 Smoking status: Current Every Day Smoker    Packs/day: 0.25    Types: Cigarettes  . Smokeless tobacco: Never Used  . Tobacco comment: 2 cigarettes a day  Vaping Use  . Vaping Use: Never used  Substance and Sexual Activity  . Alcohol use: Not Currently    Alcohol/week: 0.0 standard drinks    Comment: "A little bit"   . Drug use: Not Currently    Types: Marijuana    Comment: Last used: unknown   . Sexual activity: Not on file  Other Topics Concern  . Not on file  Social History Narrative   Patient lives at home with his mother.   Disabled.   Education 11 th grade .   Right handed.   Caffeine - one cup daily soda.   Social Determinants of Health   Financial Resource Strain:   . Difficulty of Paying Living Expenses: Not on file  Food Insecurity:   . Worried About Programme researcher, broadcasting/film/video in the Last Year: Not on file  . Ran Out of Food in the Last Year: Not on file  Transportation Needs:   . Lack of Transportation (Medical): Not on file  . Lack of Transportation (Non-Medical): Not on file  Physical Activity:   . Days of Exercise per Week: Not on file  . Minutes of Exercise per Session: Not on file  Stress:   . Feeling of Stress : Not on file  Social Connections:   . Frequency of Communication with Friends and Family: Not on file  . Frequency of Social Gatherings with Friends and Family: Not on file  . Attends Religious Services: Not on file  . Active Member of Clubs or Organizations: Not on file  . Attends Banker Meetings: Not on file  . Marital Status: Not on file    Allergies: No Known Allergies  Metabolic Disorder Labs: Lab Results  Component Value Date   HGBA1C 5.9 (H) 04/30/2020   MPG 123 04/30/2020   MPG 122.63 07/31/2017   Lab Results  Component Value Date   PROLACTIN 13.6 04/30/2020   Lab  Results  Component Value Date   CHOL 141 04/30/2020   TRIG 134 04/30/2020   HDL 37 (L) 04/30/2020   CHOLHDL 3.8 04/30/2020   VLDL 27 04/30/2020   LDLCALC 77 04/30/2020   LDLCALC 70 01/29/2011   Lab Results  Component Value Date   TSH 4.628 (H) 04/30/2020   TSH 0.848 01/18/2014    Therapeutic Level Labs: Lab Results  Component Value Date   LITHIUM <0.06 (L) 01/25/2018  LITHIUM <0.06 (L) 04/20/2015   Lab Results  Component Value Date   VALPROATE <10 (L) 07/30/2017   VALPROATE 31 (L) 07/24/2017   No components found for:  CBMZ  Current Medications: Current Outpatient Medications  Medication Sig Dispense Refill  . amLODipine (NORVASC) 10 MG tablet Take 10 mg by mouth daily.    Marland Kitchen apixaban (ELIQUIS) 5 MG TABS tablet Take 5 mg by mouth 2 (two) times daily.     . Asenapine Maleate 10 MG SUBL Place 1 tablet (10 mg total) under the tongue in the morning and at bedtime. 60 tablet 1  . benztropine (COGENTIN) 1 MG tablet Take 1 tablet (1 mg total) by mouth 2 (two) times daily. 60 tablet 1  . Calcium Citrate-Vitamin D (CALCIUM + D PO) Take 1 tablet by mouth daily.    Marland Kitchen docusate sodium (COLACE) 100 MG capsule Take 400 mg by mouth at bedtime.     . ferrous gluconate (IRON 27) 240 (27 FE) MG tablet Take 240 mg by mouth daily.    . haloperidol (HALDOL) 10 MG tablet TAKE ONE TABLET BY MOUTH TWICE A DAY 60 tablet 0  . latanoprost (XALATAN) 0.005 % ophthalmic solution Place 1 drop into both eyes at bedtime.    . Melatonin 10 MG TABS Take 10 mg by mouth at bedtime. 30 tablet 0  . metFORMIN (GLUCOPHAGE-XR) 500 MG 24 hr tablet Take 500 mg by mouth daily with breakfast.    . Multiple Vitamin (MULTIVITAMIN WITH MINERALS) TABS tablet Take 1 tablet by mouth daily.    Marland Kitchen ocrelizumab in sodium chloride 0.9 % 500 mL CHANGE ::  Ocrevus 600mg  IV EVERY 8 (EIGHT) months. 1 each 2  . temazepam (RESTORIL) 30 MG capsule Take 1 capsule (30 mg total) by mouth at bedtime. 30 capsule 0   No current  facility-administered medications for this visit.      Psychiatric Specialty Exam: Review of Systems  There were no vitals taken for this visit.There is no height or weight on file to calculate BMI.  General Appearance: Fairly groomed  Eye Contact:  Fair  Speech:  Slow and monotonus  Volume:  Decreased  Mood:  Anxious  Affect:  Restricted  Thought Process:  Goal Directed and Descriptions of Associations: Intact, Responding to internal stimuli, mumbling to self intermittently  Orientation:  Full (Time, Place, and Person)  Thought Content: Illogical, Hallucinations: Auditory and Paranoid Ideation , nihilistic delusion  Suicidal Thoughts:  No  Homicidal Thoughts:  No  Memory:  Immediate;   Good Recent;   Good  Judgement:  Fair  Insight:  Fair  Psychomotor Activity:  Normal  Concentration:  Concentration: Good and Attention Span: Fair  Recall:  of Knowledge: Poor  Language: Good  Akathisia:  Negative  Handed:  Right  AIMS (if indicated): 0  Assets:  Communication Skills Desire for Improvement Financial Resources/Insurance Housing Social Support  ADL's:  Intact  Cognition: WNL  Sleep:  Fair     Assessment and Plan: Mom reported that she has noticed slight improvement in his agitation after starting the Saphris but has not noticed any significant improvement in his hallucinations.  She is agreeable to increase the dose of Saphris for optimal effects.  Patient agreed with this as well.  Pt and Mom also agreed with recommendation of going up on the dose of Haldol Decanoate monthly injection dose.  Mom stated that she would like for him to continue taking oral Haldol along with the monthly IM  Haldol Decanoate injection for now.  He has been attending groups.   1. Disorganized schizophrenia (HCC)  - haloperidol decanoate (HALDOL DECANOATE) 100 MG/ML injection 200 mg.  Dose administered today. - haloperidol decanoate (HALDOL DECANOATE) 100 MG/ML injection; Inject 2 mLs  (200 mg total) into the muscle every 30 (thirty) days.  Dispense: 2 mL; Refill: 6 - haloperidol (HALDOL) 10 MG tablet; Take 1 tablet (10 mg total) by mouth 2 (two) times daily.  Dispense: 60 tablet; Refill: 1 - Asenapine Maleate 10 MG SUBL; Place 1 tablet (10 mg total) under the tongue in the morning, at noon, and at bedtime.  Dispense: 90 tablet; Refill: 1 - benztropine (COGENTIN) 1 MG tablet; Take 1 tablet (1 mg total) by mouth 2 (two) times daily.  Dispense: 60 tablet; Refill: 1  2. Unspecified intellectual disabilities   Return back to clinic in 30 days for next dose of injection.  Follow-up with writer in 6 weeks. Continue group therapy.  Zena Amos, MD 06/06/2020, 9:55 AM

## 2020-06-13 ENCOUNTER — Telehealth (HOSPITAL_COMMUNITY): Payer: Self-pay | Admitting: *Deleted

## 2020-06-13 NOTE — Telephone Encounter (Signed)
Prior auth submitted to Medicare and it was approved for his Saphris, 90 pills for 30 days. It has been approved from 03/14/20-07/06/20. Notified his pharmacy.

## 2020-06-21 ENCOUNTER — Emergency Department (HOSPITAL_COMMUNITY)
Admission: EM | Admit: 2020-06-21 | Discharge: 2020-06-21 | Disposition: A | Discharge status: Home / Self Care | Payer: Medicare Other | Attending: Emergency Medicine | Admitting: Emergency Medicine

## 2020-06-21 ENCOUNTER — Encounter (HOSPITAL_COMMUNITY): Payer: Self-pay

## 2020-06-21 ENCOUNTER — Other Ambulatory Visit: Payer: Self-pay

## 2020-06-21 DIAGNOSIS — I10 Essential (primary) hypertension: Secondary | ICD-10-CM | POA: Insufficient documentation

## 2020-06-21 DIAGNOSIS — F17210 Nicotine dependence, cigarettes, uncomplicated: Secondary | ICD-10-CM | POA: Diagnosis not present

## 2020-06-21 DIAGNOSIS — Z79899 Other long term (current) drug therapy: Secondary | ICD-10-CM | POA: Insufficient documentation

## 2020-06-21 DIAGNOSIS — M25552 Pain in left hip: Secondary | ICD-10-CM | POA: Diagnosis not present

## 2020-06-21 MED ORDER — ACETAMINOPHEN 500 MG PO TABS: 500.0000 mg | ORAL_TABLET | Freq: Four times a day (QID) | ORAL | 0 refills | Status: DC | PRN

## 2020-06-21 MED ORDER — ACETAMINOPHEN 500 MG PO TABS
1000.0000 mg | ORAL_TABLET | Freq: Once | ORAL | Status: AC
  Administered 2020-06-21: 1000 mg via ORAL
  Filled 2020-06-21: qty 2

## 2020-06-21 NOTE — ED Provider Notes (Signed)
[facility]-EMERGENCY DEPT Provider Note   CSN: [id number] Arrival date & time: 06/21/20  1613     History Chief Complaint  Patient presents with  . Back Pain    Brandon [name] is a 29 y.o. male.  The history is provided by the patient. No language interpreter was used.  Back Pain Associated symptoms: no fever and no numbness      29 year old male significant history of bipolar, ADHD, chronic back pain, MS, schizophrenia, brought here via EMS for evaluation of back pain.  Patient report a few hours ago he was walking, smoking a cigarette, when he accidentally dropped a cigarette, he leaned down to pick up a cigarette and when he stood up he felt pain in his left hip.  He described pain as hurting, moderate in severity which has since improved.  He was able to ambulate afterward but did call EMS to bring him here.  He states he has had this pain on a recurrent basis.  He denies any bowel bladder incontinence or saddle anesthesia no fever or chills no abdominal pain no trouble urinating.  No specific treatment tried.  Request food to eat.  Past Medical History:  Diagnosis Date  . ADHD (attention deficit hyperactivity disorder)   . Bipolar 1 disorder (HCC)   . Chronic back pain   . Chronic constipation   . Chronic neck pain   . Hypertension   . Multiple sclerosis (HCC) 05/20/2013   left sided weakness, dysarthria  . Non-compliance   . Obesity   . Pulmonary embolism (HCC)   . Schizophrenia (HCC)   . Stroke [facility])    left sided deficits - pt's mother denies this    Patient Active Problem List   Diagnosis Date Noted  . Unspecified intellectual disabilities 01/27/2020  . Migraine headache 04/18/2019  . Generalized weakness   . Pulmonary embolism (HCC) 08/12/2017  . Hyperglycemia 07/31/2017  . Multiple sclerosis exacerbation (HCC) 07/30/2017  . Acute encephalopathy 07/30/2017  . Schizoaffective disorder, bipolar type (HCC)   . Disorganized  schizophrenia (HCC)   . Hallucinations 08/05/2014  . Auditory hallucination   . Left-sided weakness 01/18/2014  . Multiple sclerosis (HCC) 05/20/2013  . 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 Abnormality of gait 11/23/2012  . HTN (hypertension) 07/04/2011  . 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 Obesity, Class III, BMI 40-49.9 (morbid obesity) (HCC) 01/14/2010  . ADHD 01/14/2010    Past Surgical History:  Procedure Laterality Date  . NO PAST SURGERIES    . None    . TOOTH EXTRACTION N/A 06/24/2019   Procedure: DENTAL RESTORATION/EXTRACTION OF TEETH NUMBER ONE, SIXTEEN, SEVENTEEN, NINETEEN, THIRTY-TWO;  Surgeon: Ocie Doyne, DDS;  Location: [facility] OR;  Service: Oral Surgery;  Laterality: N/A;       Family History  Problem Relation Age of Onset  . Diabetes Mother   . ADD / ADHD Brother     Social History   Tobacco Use  . Smoking status: Current Every Day Smoker    Packs/day: 0.25    Types: Cigarettes  . Smokeless tobacco: Never Used  . Tobacco comment: 2 cigarettes a day  Vaping Use  . Vaping Use: Never used  Substance Use Topics  . Alcohol use: Not Currently    Alcohol/week: 0.0 standard drinks    Comment: "A little bit"   . Drug use: Not Currently    Types: Marijuana    Comment: Last used: unknown     Home Medications Prior to  Admission medications   Medication Sig Start Date End Date Taking? Authorizing Provider  amLODipine (NORVASC) 10 MG tablet Take 10 mg by mouth daily.    [provider]  apixaban (ELIQUIS) 5 MG TABS tablet Take 5 mg by mouth 2 (two) times daily.     [provider]  Asenapine Maleate 10 MG SUBL Place 1 tablet (10 mg total) under the tongue in the morning, at noon, and at bedtime. 06/06/20   Zena Amos, MD  benztropine (COGENTIN) 1 MG tablet Take 1 tablet (1 mg total) by mouth 2 (two) times daily. 06/06/20   Zena Amos, MD  Calcium Citrate-Vitamin D (CALCIUM + D PO) Take 1 tablet by mouth daily.    [provider]  docusate sodium (COLACE) 100 MG capsule Take 400 mg by mouth at bedtime.     [provider]  ferrous gluconate (IRON 27) 240 (27 FE) MG tablet Take 240 mg by mouth daily.    [provider]  haloperidol (HALDOL) 10 MG tablet Take 1 tablet (10 mg total) by mouth 2 (two) times daily. 06/06/20   Zena Amos, MD  haloperidol decanoate (HALDOL DECANOATE) 100 MG/ML injection Inject 2 mLs (200 mg total) into the muscle every 30 (thirty) days. 06/06/20   Zena Amos, MD  latanoprost (XALATAN) 0.005 % ophthalmic solution Place 1 drop into both eyes at bedtime.    [provider]  Melatonin 10 MG TABS Take 10 mg by mouth at bedtime. 04/26/20   Zena Amos, MD  metFORMIN (GLUCOPHAGE-XR) 500 MG 24 hr tablet Take 500 mg by mouth daily with breakfast.    [provider]  Multiple Vitamin (MULTIVITAMIN WITH MINERALS) TABS tablet Take 1 tablet by mouth daily.    [provider]  ocrelizumab in sodium chloride 0.9 % 500 mL CHANGE ::  Ocrevus 600mg  IV EVERY 8 (EIGHT) months. 11/10/19   01/10/20, NP  temazepam (RESTORIL) 30 MG capsule Take 1 capsule (30 mg total) by mouth at bedtime. 05/04/20   Money, 05/06/20, FNP  propranolol (INDERAL) 10 MG tablet Take 1 tablet (10 mg total) by mouth 2 (two) times daily. 05/03/15 01/03/20  01/05/20, NP    Allergies    Patient has no known allergies.  Review of Systems   Review of Systems  Constitutional: Negative for fever.  Musculoskeletal: Positive for back pain.  Neurological: Negative for numbness.    Physical Exam Updated Vital Signs BP (!) 166/129 (BP Location: Left Arm)   Pulse 70   Temp 98.3 F (36.8 C) (Oral)   Resp 16   SpO2 100%   Physical Exam Vitals and nursing note reviewed.  Constitutional:      General: He is not in acute distress.    Appearance: He is well-developed and well-nourished. He is obese.     Comments: Obese male sitting in the chair eating food, laughing and appears to  be in no acute discomfort  HENT:     Head: Atraumatic.  Eyes:     Conjunctiva/sclera: Conjunctivae normal.  Abdominal:     Palpations: Abdomen is soft.     Tenderness: There is no abdominal tenderness.  Musculoskeletal:        General: Tenderness (Mild tenderness to left lateral hip with normal hip flexion extension.  Able to ambulate.  No significant midline spine tenderness) present.     Cervical back: Neck supple.  Skin:    Findings: No rash.  Neurological:     Mental  Status: He is alert. Mental status is at baseline.  Psychiatric:        Mood and Affect: Mood and affect and mood normal.     ED Results / Procedures / Treatments   Labs (all labs ordered are listed, but only abnormal results are displayed) Labs Reviewed - No data to display  EKG None  Radiology No results found.  Procedures Procedures (including critical care time)  Medications Ordered in ED Medications  acetaminophen (TYLENOL) tablet 1,000 mg (has no administration in time range)    ED Course  I have reviewed the triage vital signs and the nursing notes.  Pertinent labs & imaging results that were available during my care of the patient were reviewed by me and considered in my medical decision making (see chart for details).    MDM Rules/Calculators/A&P                          BP (!) 166/129 (BP Location: Left Arm)   Pulse 70   Temp 98.3 F (36.8 C) (Oral)   Resp 16   SpO2 100%   Final Clinical Impression(s) / ED Diagnoses Final diagnoses:  Left hip pain    Rx / DC Orders ED Discharge Orders         Ordered    acetaminophen (TYLENOL) 500 MG tablet  Every 6 hours PRN        06/21/20 1856         6:51 PM Patient with history of chronic back pain presenting complaining of pain to his left hip when he stooped down to pick up his cigarettes.  Pain is nonradiating, no concerning feature, mildly reproduce left lateral hip pain with normal ambulation.  No significant midline spine  tenderness.  Likely MSK.  Tylenol given.  Patient otherwise stable for discharge.  I did reach out to patient's legal guardian his mom will come to pick patient up.   Fayrene Helper, PA-C 06/21/20 1857    Mancel Bale, MD 06/22/20 [phone]

## 2020-06-21 NOTE — Progress Notes (Signed)
CSW received a call from triage RN to seek transport for the pt.  CSW called pt's mother who confirmed pt was safe to  And was agreeable to pt going home in an [place] via safe transport.  RN escorted pt to the [facility] and safely inside, per triage RN.  CSW will continue to follow for D/C needs.  Dorothe Pea. Lucas Winograd  MSW, LCSW, LCAS, CCS Transitions of Care Clinical Social Worker Care Coordination Department Ph: [phone]

## 2020-06-21 NOTE — ED Triage Notes (Signed)
Pt arrived via EMS, c/o lower back pain x30 mins. Denies any other issues at this time.

## 2020-06-21 NOTE — Discharge Instructions (Signed)
Your hip pain is likely due to muscle strain.  Take Tylenol as needed.  Follow-up with your doctor for further care.

## 2020-06-21 NOTE — ED Notes (Addendum)
Spoke to pt's guardian (mother, Runette) regarding transport home. She states they have been looking for him for about 4 hours. She does not drive and will call cab/[employer] to come pick him up. Provided hospital address.

## 2020-06-21 NOTE — ED Notes (Signed)
Pt exited room x3 and started to wander hall. Redirected to room and provided with sandwich and soda upon pt request.

## 2020-06-25 ENCOUNTER — Other Ambulatory Visit (HOSPITAL_COMMUNITY): Payer: Self-pay | Admitting: Psychiatry

## 2020-06-25 DIAGNOSIS — F201 Disorganized schizophrenia: Secondary | ICD-10-CM

## 2020-06-27 ENCOUNTER — Other Ambulatory Visit: Payer: Self-pay

## 2020-06-27 ENCOUNTER — Non-Acute Institutional Stay (HOSPITAL_COMMUNITY)
Admission: RE | Admit: 2020-06-27 | Discharge: 2020-06-27 | Disposition: A | Discharge status: Home / Self Care | Payer: Medicare Other | Source: Ambulatory Visit | Attending: Internal Medicine | Admitting: Internal Medicine

## 2020-06-27 DIAGNOSIS — G35 Multiple sclerosis: Secondary | ICD-10-CM | POA: Diagnosis present

## 2020-06-27 MED ORDER — SODIUM CHLORIDE 0.9 % IV SOLN
600.0000 mg | Freq: Once | INTRAVENOUS | Status: AC
  Administered 2020-06-27: 600 mg via INTRAVENOUS
  Filled 2020-06-27: qty 20

## 2020-06-27 MED ORDER — DIPHENHYDRAMINE HCL 50 MG/ML IJ SOLN
25.0000 mg | Freq: Once | INTRAMUSCULAR | Status: AC
  Administered 2020-06-27: 25 mg via INTRAVENOUS
  Filled 2020-06-27: qty 1

## 2020-06-27 MED ORDER — SODIUM CHLORIDE 0.9 % IV SOLN
INTRAVENOUS | Status: DC | PRN
  Administered 2020-06-27: 250 mL via INTRAVENOUS

## 2020-06-27 MED ORDER — ACETAMINOPHEN 325 MG PO TABS
650.0000 mg | ORAL_TABLET | Freq: Once | ORAL | Status: AC
  Administered 2020-06-27: 650 mg via ORAL
  Filled 2020-06-27: qty 2

## 2020-06-27 MED ORDER — METHYLPREDNISOLONE SODIUM SUCC 125 MG IJ SOLR
100.0000 mg | Freq: Once | INTRAMUSCULAR | Status: AC
  Administered 2020-06-27: 100 mg via INTRAVENOUS
  Filled 2020-06-27: qty 2

## 2020-06-27 NOTE — Progress Notes (Signed)
PATIENT CARE CENTER NOTE  Diagnosis: Multiple Sclerosis    Provider: Stephanie Acre, MD   Procedure: Ocrevus 600 mg IV    Note: Patient received Ocrevus via PIV. Pre-medications given per order and infusion titrated per protocol. Patient tolerated infusion well.  Mother at bedside throughout infusion. BP elevated throughout infusion which is patient's baseline. Mother will continue to monitor patient's BP and consult PCP. Discharge instructions given. Patient alert, oriented and ambulatory at discharge.

## 2020-06-27 NOTE — Discharge Instructions (Signed)
Ocrelizumab injection °What is this medicine? °OCRELIZUMAB (ok re LIZ ue mab) treats multiple sclerosis. It helps to decrease the number of multiple sclerosis relapses. It is not a cure. °This medicine may be used for other purposes; ask your health care provider or pharmacist if you have questions. °COMMON BRAND NAME(S): OCREVUS °What should I tell my health care provider before I take this medicine? °They need to know if you have any of these conditions: °· cancer °· hepatitis B infection °· other infection (especially a virus infection such as chickenpox, cold sores, or herpes) °· an unusual or allergic reaction to ocrelizumab, other medicines, foods, dyes or preservatives °· pregnant or trying to get pregnant °· breast-feeding °How should I use this medicine? °This medicine is for infusion into a vein. It is given by a health care professional in a hospital or clinic setting. °A special MedGuide will be given to you before each treatment. Be sure to read this information carefully each time. °Talk to your pediatrician regarding the use of this medicine in children. Special care may be needed. °Overdosage: If you think you have taken too much of this medicine contact a poison control center or emergency room at once. °NOTE: This medicine is only for you. Do not share this medicine with others. °What if I miss a dose? °Keep appointments for follow-up doses as directed. It is important not to miss your dose. Call your doctor or health care professional if you are unable to keep an appointment. °What may interact with this medicine? °· alemtuzumab °· daclizumab °· dimethyl fumarate °· fingolimod °· glatiramer °· interferon beta °· live virus vaccines °· mitoxantrone °· natalizumab °· peginterferon beta °· rituximab °· steroid medicines like prednisone or cortisone °· teriflunomide °This list may not describe all possible interactions. Give your health care provider a list of all the medicines, herbs,  non-prescription drugs, or dietary supplements you use. Also tell them if you smoke, drink alcohol, or use illegal drugs. Some items may interact with your medicine. °What should I watch for while using this medicine? °Tell your doctor or healthcare professional if your symptoms do not start to get better or if they get worse. °This medicine can cause serious allergic reactions. To reduce your risk you may need to take medicine before treatment with this medicine. Take your medicine as directed. °Women should inform their doctor if they wish to become pregnant or think they might be pregnant. There is a potential for serious side effects to an unborn child. Talk to your health care professional or pharmacist for more information. Male patients should use effective birth control methods while receiving this medicine and for 6 months after the last dose. °Call your doctor or health care professional for advice if you get a fever, chills or sore throat, or other symptoms of a cold or flu. Do not treat yourself. This drug decreases your body's ability to fight infections. Try to avoid being around people who are sick. °If you have a hepatitis B infection or a history of a hepatitis B infection, talk to your doctor. The symptoms of hepatitis B may get worse if you take this medicine. °In some patients, this medicine may cause a serious brain infection that may cause death. If you have any problems seeing, thinking, speaking, walking, or standing, tell your doctor right away. If you cannot reach your doctor, urgently seek other source of medical care. °This medicine can decrease the response to a vaccine. If you need to get   vaccinated, tell your healthcare professional if you have received this medicine. Extra booster doses may be needed. Talk to your doctor to see if a different vaccination schedule is needed. °Talk to your doctor about your risk of cancer. You may be more at risk for certain types of cancers if you  take this medicine. °What side effects may I notice from receiving this medicine? °Side effects that you should report to your doctor or health care professional as soon as possible: °· allergic reactions like skin rash, itching or hives, swelling of the face, lips, or tongue °· breathing problems °· facial flushing °· fast, irregular heartbeat °· lump or soreness in the breast °· signs and symptoms of herpes such as cold sore, shingles, or genital sores °· signs and symptoms of infection like fever or chills, cough, sore throat, pain or trouble passing urine °· signs and symptoms of low blood pressure like dizziness; feeling faint or lightheaded, falls; unusually weak or tired °· signs and symptoms of progressive multifocal leukoencephalopathy (PML) like changes in vision; clumsiness; confusion; personality changes; weakness on one side of the body °· swelling of the ankles, feet, hands °Side effects that usually do not require medical attention (report these to your doctor or health care professional if they continue or are bothersome): °· back pain °· depressed mood °· diarrhea °· pain, redness, or irritation at site where injected °This list may not describe all possible side effects. Call your doctor for medical advice about side effects. You may report side effects to FDA at 1-800-FDA-1088. °Where should I keep my medicine? °This drug is given in a hospital or clinic and will not be stored at home. °NOTE: This sheet is a summary. It may not cover all possible information. If you have questions about this medicine, talk to your doctor, pharmacist, or health care provider. °© 2020 [employer] (2018-06-28 07:41:53) ° °

## 2020-07-02 ENCOUNTER — Encounter (HOSPITAL_COMMUNITY): Payer: Medicare Other

## 2020-07-04 ENCOUNTER — Other Ambulatory Visit: Payer: Self-pay

## 2020-07-04 ENCOUNTER — Encounter (HOSPITAL_COMMUNITY): Payer: Self-pay

## 2020-07-04 ENCOUNTER — Ambulatory Visit (INDEPENDENT_AMBULATORY_CARE_PROVIDER_SITE_OTHER): Payer: Medicare Other | Admitting: *Deleted

## 2020-07-04 ENCOUNTER — Telehealth: Payer: Self-pay | Admitting: *Deleted

## 2020-07-04 VITALS — BP 116/80 | HR 83 | Ht 73.0 in

## 2020-07-04 DIAGNOSIS — F201 Disorganized schizophrenia: Secondary | ICD-10-CM | POA: Diagnosis not present

## 2020-07-04 NOTE — Telephone Encounter (Signed)
Received approval for ocrevus ID [id number] approved from 04-05-20 thru 07-04-2021. [employer], faxed to [facility] [phone] with confirmation received.

## 2020-07-04 NOTE — Progress Notes (Signed)
PATIENT ARRIVED FOR INJECTION WITH GRANDMA THIS MORNING.  PATIENT VERY PLEASANT &  A LITTLE TALKATIVE TODAY AFTER GRANDMA  SHARED HIS NICKNAME WITH [place]. PATIENT TOLERATED INJECTION WELL IN RIGHT-ARM.

## 2020-07-04 NOTE — Telephone Encounter (Addendum)
Received fax from [facility] about needing OCREVUS PA for pt.  Last received 06-27-20 at [facility].  Initiated renewal ocrevus 300mg /9ml solution.  KEY [id number] Determin Pt to receive 600mg  IV every 8 months.

## 2020-07-09 MED FILL — LATANOPROST 0.005% EYE DRP: 0.005 | 75 days supply | Qty: 8 | Fill #1

## 2020-07-10 ENCOUNTER — Telehealth (HOSPITAL_COMMUNITY): Payer: Self-pay | Admitting: *Deleted

## 2020-07-10 NOTE — Telephone Encounter (Signed)
Patients mom left a VM stating he needed medications at [facility] but the information wasn't clear on what she needed and when. I called the pharmacy and this clinic needs to take no action right now, he has refills on the medicines he gets thru this clinic but insurance wont let her fill them for 4 more days.

## 2020-07-10 NOTE — Telephone Encounter (Signed)
Opened a second time in error, no further documentation needed.

## 2020-07-16 ENCOUNTER — Emergency Department (HOSPITAL_COMMUNITY): Payer: Medicare Other

## 2020-07-16 ENCOUNTER — Encounter (HOSPITAL_COMMUNITY): Payer: Self-pay | Admitting: Emergency Medicine

## 2020-07-16 ENCOUNTER — Telehealth (HOSPITAL_COMMUNITY): Payer: Self-pay | Admitting: *Deleted

## 2020-07-16 ENCOUNTER — Inpatient Hospital Stay (HOSPITAL_COMMUNITY)
Admission: EM | Admit: 2020-07-16 | Discharge: 2020-07-20 | DRG: 178 | Disposition: A | Discharge status: Home / Self Care | Payer: Medicare Other | Attending: Internal Medicine | Admitting: Internal Medicine

## 2020-07-16 DIAGNOSIS — F17210 Nicotine dependence, cigarettes, uncomplicated: Secondary | ICD-10-CM | POA: Diagnosis present

## 2020-07-16 DIAGNOSIS — Z993 Dependence on wheelchair: Secondary | ICD-10-CM

## 2020-07-16 DIAGNOSIS — Z86711 Personal history of pulmonary embolism: Secondary | ICD-10-CM

## 2020-07-16 DIAGNOSIS — F201 Disorganized schizophrenia: Secondary | ICD-10-CM

## 2020-07-16 DIAGNOSIS — Z8673 Personal history of transient ischemic attack (TIA), and cerebral infarction without residual deficits: Secondary | ICD-10-CM

## 2020-07-16 DIAGNOSIS — U071 COVID-19: Secondary | ICD-10-CM | POA: Diagnosis not present

## 2020-07-16 DIAGNOSIS — D849 Immunodeficiency, unspecified: Secondary | ICD-10-CM | POA: Diagnosis present

## 2020-07-16 DIAGNOSIS — G35 Multiple sclerosis: Secondary | ICD-10-CM | POA: Diagnosis present

## 2020-07-16 DIAGNOSIS — Z7984 Long term (current) use of oral hypoglycemic drugs: Secondary | ICD-10-CM

## 2020-07-16 DIAGNOSIS — M5134 Other intervertebral disc degeneration, thoracic region: Secondary | ICD-10-CM | POA: Diagnosis present

## 2020-07-16 DIAGNOSIS — Z6841 Body Mass Index (BMI) 40.0 and over, adult: Secondary | ICD-10-CM

## 2020-07-16 DIAGNOSIS — M4804 Spinal stenosis, thoracic region: Secondary | ICD-10-CM | POA: Diagnosis present

## 2020-07-16 DIAGNOSIS — F79 Unspecified intellectual disabilities: Secondary | ICD-10-CM

## 2020-07-16 DIAGNOSIS — I10 Essential (primary) hypertension: Secondary | ICD-10-CM | POA: Diagnosis present

## 2020-07-16 DIAGNOSIS — Z7901 Long term (current) use of anticoagulants: Secondary | ICD-10-CM

## 2020-07-16 DIAGNOSIS — R531 Weakness: Secondary | ICD-10-CM

## 2020-07-16 DIAGNOSIS — F909 Attention-deficit hyperactivity disorder, unspecified type: Secondary | ICD-10-CM | POA: Diagnosis present

## 2020-07-16 DIAGNOSIS — F319 Bipolar disorder, unspecified: Secondary | ICD-10-CM | POA: Diagnosis present

## 2020-07-16 DIAGNOSIS — Z79899 Other long term (current) drug therapy: Secondary | ICD-10-CM

## 2020-07-16 LAB — CBC WITH DIFFERENTIAL/PLATELET
Abs Immature Granulocytes: 0.02 10*3/uL (ref 0.00–0.07)
Basophils Absolute: 0 10*3/uL (ref 0.0–0.1)
Basophils Relative: 0 %
Eosinophils Absolute: 0 10*3/uL (ref 0.0–0.5)
Eosinophils Relative: 0 %
HCT: 44.4 % (ref 39.0–52.0)
Hemoglobin: 14.3 g/dL (ref 13.0–17.0)
Immature Granulocytes: 0 %
Lymphocytes Relative: 5 %
Lymphs Abs: 0.4 10*3/uL — ABNORMAL LOW (ref 0.7–4.0)
MCH: 26.1 pg (ref 26.0–34.0)
MCHC: 32.2 g/dL (ref 30.0–36.0)
MCV: 81.2 fL (ref 80.0–100.0)
Monocytes Absolute: 1 10*3/uL (ref 0.1–1.0)
Monocytes Relative: 14 %
Neutro Abs: 5.9 10*3/uL (ref 1.7–7.7)
Neutrophils Relative %: 81 %
Platelets: 197 10*3/uL (ref 150–400)
RBC: 5.47 MIL/uL (ref 4.22–5.81)
RDW: 14 % (ref 11.5–15.5)
WBC: 7.3 10*3/uL (ref 4.0–10.5)
nRBC: 0 % (ref 0.0–0.2)

## 2020-07-16 LAB — COMPREHENSIVE METABOLIC PANEL
ALT: 71 U/L — ABNORMAL HIGH (ref 0–44)
AST: 117 U/L — ABNORMAL HIGH (ref 15–41)
Albumin: 4.2 g/dL (ref 3.5–5.0)
Alkaline Phosphatase: 97 U/L (ref 38–126)
Anion gap: 13 (ref 5–15)
BUN: 12 mg/dL (ref 6–20)
CO2: 23 mmol/L (ref 22–32)
Calcium: 9.9 mg/dL (ref 8.9–10.3)
Chloride: 104 mmol/L (ref 98–111)
Creatinine, Ser: 0.97 mg/dL (ref 0.61–1.24)
GFR, Estimated: >60 mL/min (ref >60)
Glucose, Bld: 89 mg/dL (ref 70–99)
Potassium: 3.9 mmol/L (ref 3.5–5.1)
Sodium: 140 mmol/L (ref 135–145)
Total Bilirubin: 0.6 mg/dL (ref 0.3–1.2)
Total Protein: 7 g/dL (ref 6.5–8.1)

## 2020-07-16 LAB — RESP PANEL BY RT-PCR (FLU A&B, COVID) ARPGX2
Influenza A by PCR: NEGATIVE
Influenza B by PCR: NEGATIVE
SARS Coronavirus 2 by RT PCR: POSITIVE — AB

## 2020-07-16 LAB — URINALYSIS, COMPLETE (UACMP) WITH MICROSCOPIC
Bacteria, UA: NONE SEEN
Bilirubin Urine: NEGATIVE
Glucose, UA: NEGATIVE mg/dL
Hgb urine dipstick: NEGATIVE
Ketones, ur: 5 mg/dL — AB
Leukocytes,Ua: NEGATIVE
Nitrite: NEGATIVE
Protein, ur: 30 mg/dL — AB
Specific Gravity, Urine: 1.03 (ref 1.005–1.030)
pH: 5 (ref 5.0–8.0)

## 2020-07-16 LAB — APTT: aPTT: 29 seconds (ref 24–36)

## 2020-07-16 LAB — PROTIME-INR
INR: 1.2 (ref 0.8–1.2)
Prothrombin Time: 14.3 seconds (ref 11.4–15.2)

## 2020-07-16 LAB — LACTIC ACID, PLASMA: Lactic Acid, Venous: 1.4 mmol/L (ref 0.5–1.9)

## 2020-07-16 MED ORDER — ACETAMINOPHEN 650 MG RE SUPP
650.0000 mg | Freq: Once | RECTAL | Status: AC
  Administered 2020-07-16: 650 mg via RECTAL
  Filled 2020-07-16: qty 1

## 2020-07-16 MED ORDER — SODIUM CHLORIDE 0.9 % IV BOLUS
1000.0000 mL | Freq: Once | INTRAVENOUS | Status: AC
  Administered 2020-07-16: 1000 mL via INTRAVENOUS

## 2020-07-16 MED ORDER — BENZTROPINE MESYLATE 1 MG PO TABS: 1.0000 mg | ORAL_TABLET | Freq: Two times a day (BID) | ORAL | 1 refills | Status: DC

## 2020-07-16 MED ORDER — HALOPERIDOL 10 MG PO TABS: 10.0000 mg | ORAL_TABLET | Freq: Two times a day (BID) | ORAL | 1 refills | Status: DC

## 2020-07-16 MED ORDER — ASENAPINE MALEATE 10 MG SL SUBL: 10.0000 mg | SUBLINGUAL_TABLET | Freq: Three times a day (TID) | SUBLINGUAL | 1 refills | Status: DC

## 2020-07-16 MED ORDER — HALOPERIDOL DECANOATE 100 MG/ML IM SOLN: 200.0000 mg | INTRAMUSCULAR | 6 refills | Status: DC

## 2020-07-16 NOTE — Telephone Encounter (Signed)
I went ahead and sent prescriptions for all his medications.

## 2020-07-16 NOTE — ED Notes (Signed)
Attempted to ambulate pt without success. Pt c/o generalized weakness.

## 2020-07-16 NOTE — Addendum Note (Signed)
Addended by: Zena Amos on: 07/16/2020 09:31 AM   Modules accepted: Orders

## 2020-07-16 NOTE — ED Notes (Signed)
Pt alert, slow to respond but answering questions appropriately at this time.

## 2020-07-16 NOTE — ED Provider Notes (Incomplete)
Patient signed out at end of shift by Farrel Gordon, PAC, pending admission consideration for generalized weakness, COVID+, h/o MS, h/o schizophrenia.  PResented with low grade fever, tachycardia, fell in the shower and unable to ambulate.

## 2020-07-16 NOTE — ED Notes (Signed)
Attempted IV x2 without success  

## 2020-07-16 NOTE — ED Provider Notes (Signed)
[facility]   CSN: [id number] Arrival date & time: 07/16/20  [id number]     History No chief complaint on file.   Brandon [name] is a 30 y.o. male with pertinent past medical history of ADHD, bipolar 1, multiple sclerosis, pulmonary embolism on Eliquis that presents to the emergency department today for fall.  Patient was found in his shower by his mom, hit his head with no obvious injury.  Patient will answer yes or no, however is primarily nonverbal.  Patient level 5 caveat due to this.  Was able to speak to mom on the phone who states that he did fall 3 days ago, EMS was called out to the house however did need to go to the emergency department, states that for the past 3 days he has been feeling weak.  States that today he fell in the shower, mom was able to find him no loss of consciousness or injury however mom called EMS because he was too weak to get up by himself.  This does not normally happen.  She states that when this does occur patient is normally having an MS flare.  Mom states that he has been compliant with all of his medications.  Denies any fevers or chills.  Denies any vomiting, diarrhea, cough, sick contacts.  Has been vaccinated against COVID.  In regards to MS, he has been receiving infusions, 1 every 8 months, she states that his last 1 was in October.  Marland Kitchen.  HPI     Past Medical History:  Diagnosis Date  . ADHD (attention deficit hyperactivity disorder)   . Bipolar 1 disorder (HCC)   . Chronic back pain   . Chronic constipation   . Chronic neck pain   . Hypertension   . Multiple sclerosis (HCC) 05/20/2013   left sided weakness, dysarthria  . Non-compliance   . Obesity   . Pulmonary embolism (HCC)   . Schizophrenia (HCC)   . Stroke [facility](HCC)    left sided deficits - pt's mother denies this    Patient Active Problem List   Diagnosis Date Noted  . Unspecified intellectual disabilities 01/27/2020  . Migraine  headache 04/18/2019  . Generalized weakness   . Pulmonary embolism (HCC) 08/12/2017  . Hyperglycemia 07/31/2017  . Multiple sclerosis exacerbation (HCC) 07/30/2017  . Acute encephalopathy 07/30/2017  . Schizoaffective disorder, bipolar type (HCC)   . Disorganized schizophrenia (HCC)   . Hallucinations 08/05/2014  . Auditory hallucination   . Left-sided weakness 01/18/2014  . Multiple sclerosis (HCC) 05/20/2013  . 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 Abnormality of gait 11/23/2012  . HTN (hypertension) 07/04/2011  . 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 Obesity, Class III, BMI 40-49.9 (morbid obesity) (HCC) 01/14/2010  . ADHD 01/14/2010    Past Surgical History:  Procedure Laterality Date  . NO PAST SURGERIES    . None    . TOOTH EXTRACTION N/A 06/24/2019   Procedure: DENTAL RESTORATION/EXTRACTION OF TEETH NUMBER ONE, SIXTEEN, SEVENTEEN, NINETEEN, THIRTY-TWO;  Surgeon: Ocie DoyneJensen, Scott, DDS;  Location: [facility] OR;  Service: Oral Surgery;  Laterality: N/A;       Family History  Problem Relation Age of Onset  . Diabetes Mother   . ADD / ADHD Brother     Social History   Tobacco Use  . Smoking status: Current Every Day Smoker    Packs/day: 0.25    Types: Cigarettes  . Smokeless tobacco: Never Used  . Tobacco comment: 2  cigarettes a day  Vaping Use  . Vaping Use: Never used  Substance Use Topics  . Alcohol use: Not Currently    Alcohol/week: 0.0 standard drinks    Comment: "A little bit"   . Drug use: Not Currently    Types: Marijuana    Comment: Last used: unknown     Home Medications Prior to Admission medications   Medication Sig Start Date End Date Taking? Authorizing Provider  acetaminophen (TYLENOL) 500 MG tablet Take 1 tablet (500 mg total) by mouth every 6 (six) hours as needed. 06/21/20   Fayrene Helper, PA-C  amLODipine (NORVASC) 10 MG tablet Take 10 mg by mouth daily.    [provider]  apixaban (ELIQUIS) 5 MG TABS tablet Take 5 mg by mouth 2 (two)  times daily.     [provider]  Asenapine Maleate 10 MG SUBL Place 1 tablet (10 mg total) under the tongue in the morning, at noon, and at bedtime. 07/16/20   Zena Amos, MD  benztropine (COGENTIN) 1 MG tablet Take 1 tablet (1 mg total) by mouth 2 (two) times daily. 07/16/20   Zena Amos, MD  Calcium Citrate-Vitamin D (CALCIUM + D PO) Take 1 tablet by mouth daily.    [provider]  docusate sodium (COLACE) 100 MG capsule Take 400 mg by mouth at bedtime.     [provider]  ferrous gluconate (FERGON) 240 (27 FE) MG tablet Take 240 mg by mouth daily.    [provider]  haloperidol (HALDOL) 10 MG tablet Take 1 tablet (10 mg total) by mouth 2 (two) times daily. 07/16/20   Zena Amos, MD  haloperidol decanoate (HALDOL DECANOATE) 100 MG/ML injection Inject 2 mLs (200 mg total) into the muscle every 30 (thirty) days. 07/16/20   Zena Amos, MD  latanoprost (XALATAN) 0.005 % ophthalmic solution Place 1 drop into both eyes at bedtime.    [provider]  Melatonin 10 MG CAPS TAKE ONE TABLET BY MOUTH AT BEDTIME 06/25/20   Zena Amos, MD  metFORMIN (GLUCOPHAGE-XR) 500 MG 24 hr tablet Take 500 mg by mouth daily with breakfast.    [provider]  Multiple Vitamin (MULTIVITAMIN WITH MINERALS) TABS tablet Take 1 tablet by mouth daily.    [provider]  ocrelizumab in sodium chloride 0.9 % 500 mL CHANGE ::  Ocrevus 600mg  IV EVERY 8 (EIGHT) months. 11/10/19   01/10/20, NP  temazepam (RESTORIL) 30 MG capsule Take 1 capsule (30 mg total) by mouth at bedtime. 05/04/20   Money, 05/06/20, FNP  propranolol (INDERAL) 10 MG tablet Take 1 tablet (10 mg total) by mouth 2 (two) times daily. 05/03/15 01/03/20  01/05/20, NP    Allergies    Patient has no known allergies.  Review of Systems   Review of Systems  Constitutional: Negative for chills, diaphoresis, fatigue and fever.  HENT: Negative for congestion, sore throat and trouble  swallowing.   Eyes: Negative for pain and visual disturbance.  Respiratory: Negative for cough, shortness of breath and wheezing.   Cardiovascular: Negative for chest pain, palpitations and leg swelling.  Gastrointestinal: Negative for abdominal distention, abdominal pain, diarrhea, nausea and vomiting.  Genitourinary: Negative for difficulty urinating.  Musculoskeletal: Negative for back pain, neck pain and neck stiffness.  Skin: Negative for pallor.  Neurological: Positive for weakness. Negative for dizziness, speech difficulty and headaches.  Psychiatric/Behavioral: Negative for confusion.    Physical Exam Updated Vital Signs BP 121/86   Pulse  91   Temp 98.9 F (37.2 C) (Oral)   Resp (!) 24   SpO2 97%   Physical Exam Constitutional:      General: He is not in acute distress.    Appearance: Normal appearance. He is diaphoretic. He is not ill-appearing or toxic-appearing.     Comments: No acute distress, patient is slightly diaphoretic.  HENT:     Head: Normocephalic and atraumatic.     Mouth/Throat:     Mouth: Mucous membranes are moist.     Pharynx: Oropharynx is clear.  Eyes:     General: No scleral icterus.    Extraocular Movements: Extraocular movements intact.     Pupils: Pupils are equal, round, and reactive to light.  Cardiovascular:     Rate and Rhythm: Normal rate and regular rhythm.     Pulses: Normal pulses.     Heart sounds: Normal heart sounds.  Pulmonary:     Effort: Pulmonary effort is normal. No respiratory distress.     Breath sounds: Normal breath sounds. No stridor. No wheezing, rhonchi or rales.  Chest:     Chest wall: No tenderness.  Abdominal:     General: Abdomen is flat. There is no distension.     Palpations: Abdomen is soft.     Tenderness: There is no abdominal tenderness. There is no guarding or rebound.  Musculoskeletal:        General: No swelling or tenderness. Normal range of motion.     Cervical back: Normal range of motion and neck  supple. No rigidity.     Right lower leg: No edema.     Left lower leg: No edema.  Skin:    General: Skin is warm.     Capillary Refill: Capillary refill takes less than 2 seconds.     Coloration: Skin is not pale.  Neurological:     General: No focal deficit present.     Mental Status: He is alert and oriented to person, place, and time.     Comments: Alert.  Speech is somewhat delayed, will say yes or no oral answers questions in very short phrases, patient is very quiet and flat.  No facial droop. CNIII-XII grossly intact. Bilateral upper and lower extremities' sensation grossly intact. 5/5 symmetric strength with grip strength and with plantar and dorsi flexion bilaterally. Negative pronator drift.   Psychiatric:        Mood and Affect: Mood normal.        Behavior: Behavior normal.     ED Results / Procedures / Treatments   Labs (all labs ordered are listed, but only abnormal results are displayed) Labs Reviewed  RESP PANEL BY RT-PCR (FLU A&B, COVID) ARPGX2 - Abnormal; Notable for the following components:      Result Value   SARS Coronavirus 2 by RT PCR POSITIVE (*)    All other components within normal limits  COMPREHENSIVE METABOLIC PANEL - Abnormal; Notable for the following components:   AST 117 (*)    ALT 71 (*)    All other components within normal limits  CBC WITH DIFFERENTIAL/PLATELET - Abnormal; Notable for the following components:   Lymphs Abs 0.4 (*)    All other components within normal limits  URINALYSIS, COMPLETE (UACMP) WITH MICROSCOPIC - Abnormal; Notable for the following components:   Color, Urine AMBER (*)    Ketones, ur 5 (*)    Protein, ur 30 (*)    All other components within normal limits  URINE CULTURE  CULTURE,  BLOOD (ROUTINE X 2)  CULTURE, BLOOD (ROUTINE X 2)  LACTIC ACID, PLASMA  PROTIME-INR  APTT  CBG MONITORING, ED    EKG EKG Interpretation  Date/Time:  Monday July 16 2020 19:29:13 EST Ventricular Rate:  93 PR Interval:    QRS  Duration: 88 QT Interval:  332 QTC Calculation: 413 R Axis:   14 Text Interpretation: Sinus rhythm no acute st/ts No significant change since prior 10/21 Confirmed by Meridee Score [phone]) on 07/16/2020 7:31:37 PM   Radiology CT Head Wo Contrast  Result Date: 07/16/2020 CLINICAL DATA:  Altered mental status and neck injury. EXAM: CT HEAD WITHOUT CONTRAST CT CERVICAL SPINE WITHOUT CONTRAST TECHNIQUE: Multidetector CT imaging of the head and cervical spine was performed following the standard protocol without intravenous contrast. Multiplanar CT image reconstructions of the cervical spine were also generated. COMPARISON:  January 25, 2018.  September 22, 2017. FINDINGS: CT HEAD FINDINGS Brain: Stable periventricular white matter low densities are noted consistent with history of multiple sclerosis. No mass effect or midline shift is noted. Ventricular size is within normal limits. There is no evidence of mass lesion, hemorrhage or acute infarction. Vascular: No hyperdense vessel or unexpected calcification. Skull: Normal. Negative for fracture or focal lesion. Sinuses/Orbits: No acute finding. Other: None. CT CERVICAL SPINE FINDINGS Alignment: Normal. Skull base and vertebrae: No acute fracture. No primary bone lesion or focal pathologic process. Soft tissues and spinal canal: No prevertebral fluid or swelling. No visible canal hematoma. Disc levels:  Normal. Upper chest: Negative. Other: None. IMPRESSION: 1. Stable periventricular white matter low densities are noted consistent with history of multiple sclerosis. No acute intracranial abnormality seen. 2. Normal cervical spine. Electronically Signed   By: Lupita Raider M.D.   On: 07/16/2020 10:13   CT Cervical Spine Wo Contrast  Result Date: 07/16/2020 CLINICAL DATA:  Altered mental status and neck injury. EXAM: CT HEAD WITHOUT CONTRAST CT CERVICAL SPINE WITHOUT CONTRAST TECHNIQUE: Multidetector CT imaging of the head and cervical spine was performed  following the standard protocol without intravenous contrast. Multiplanar CT image reconstructions of the cervical spine were also generated. COMPARISON:  January 25, 2018.  September 22, 2017. FINDINGS: CT HEAD FINDINGS Brain: Stable periventricular white matter low densities are noted consistent with history of multiple sclerosis. No mass effect or midline shift is noted. Ventricular size is within normal limits. There is no evidence of mass lesion, hemorrhage or acute infarction. Vascular: No hyperdense vessel or unexpected calcification. Skull: Normal. Negative for fracture or focal lesion. Sinuses/Orbits: No acute finding. Other: None. CT CERVICAL SPINE FINDINGS Alignment: Normal. Skull base and vertebrae: No acute fracture. No primary bone lesion or focal pathologic process. Soft tissues and spinal canal: No prevertebral fluid or swelling. No visible canal hematoma. Disc levels:  Normal. Upper chest: Negative. Other: None. IMPRESSION: 1. Stable periventricular white matter low densities are noted consistent with history of multiple sclerosis. No acute intracranial abnormality seen. 2. Normal cervical spine. Electronically Signed   By: Lupita Raider M.D.   On: 07/16/2020 10:13   DG Chest Port 1 View  Result Date: 07/16/2020 CLINICAL DATA:  30 year old male with sepsis. EXAM: PORTABLE CHEST 1 VIEW COMPARISON:  Chest radiograph dated 12/04/2017 FINDINGS: There is shallow inspiration. Minimal bibasilar atelectasis. No focal consolidation, pleural effusion, pneumothorax. The cardiac silhouette is within limits. No acute osseous pathology. IMPRESSION: Shallow inspiration with minimal bibasilar atelectasis. Electronically Signed   By: Elgie Collard M.D.   On: 07/16/2020 18:58    Procedures Procedures (  including critical care time)  Medications Ordered in ED Medications  acetaminophen (TYLENOL) suppository 650 mg (650 mg Rectal Given 07/16/20 1911)  sodium chloride 0.9 % bolus 1,000 mL (0 mLs Intravenous  Stopped 07/16/20 2128)    ED Course  I have reviewed the triage vital signs and the nursing notes.  Pertinent labs & imaging results that were available during my care of the patient were reviewed by me and considered in my medical decision making (see chart for details).  Clinical Course as of 07/16/20 2355  Mon Jul 16, 2020  [id number] 30 year old male history of MS here from home after being found down in the shower. He is on blood thinners. He is febrile here along with tachycardic. Getting septic work-up. Probable admission. [MB]    Clinical Course User Index [MB] Terrilee Files, MD   MDM Rules/Calculators/A&P                         Brandon [name] is a 30 y.o. male with pertinent past medical history of ADHD, bipolar 1, multiple sclerosis, pulmonary embolism on Eliquis that presents to the emergency department today for fall.  Patient does not appear to be in acute distress, slightly diaphoretic on exam.  Rectal temperature 1.7, tachycardic to 120s with some tachypnea noted.  Will obtain septic work-up at this time.  In regards to neuro exam, is at baseline.  Spoke to mom about patient's delayed speech, she states that this is normal for him.  Nonfocal neuro exam, CT head and neck without any acute abnormality.  Work-up today to include no leukocytosis, CMP with slightly elevated LFTs.  COVID-positive.  This could be explaining patient's weakness.  Patient was not weak on my exam, will have nursing staff walk patient, if patient can walk then patient can go home.  Did discuss this with mom who agrees.  Nursing attempted trying to walk patient, however patient was unable to stand up from bed due to weakness.  Awaiting urine.  Mom states patient is normally able to walk at home and bathe himself.  I personally tried to walk patient as well, patient was not able to walk.  P I think patient would benefit from admission at this time.  No hypoxia or respiratory distress.  Fever has come down,  nontachycardic anymore.  Pt care was handed off to S. Upstill PA-C at 1200.  Complete history and physical and current plan have been communicated.  Please refer to their note for the remainder of ED care and ultimate disposition.  I discussed this case with my attending physician who cosigned this note including patient's presenting symptoms, physical exam, and planned diagnostics and interventions. Attending physician stated agreement with plan or made changes to plan which were implemented.   Attending physician assessed patient at bedside.  Final Clinical Impression(s) / ED Diagnoses Final diagnoses:  COVID    Rx / DC Orders ED Discharge Orders    None       Farrel Gordon, PA-C 07/16/20 2356    Terrilee Files, MD 07/17/20 1020

## 2020-07-16 NOTE — ED Provider Notes (Signed)
Patient signed out at end of shift by Farrel Gordon, PAC, pending admission consideration for generalized weakness, COVID+, h/o MS, h/o schizophrenia.  Presented with low grade fever, tachycardia, fell in the shower and unable to ambulate since.   Labs are reassuring. Mildly elevated liver enzymes. No evidence suggesting acute bacterial infection. CXR negative. Head and neck CT's performed and are negative for acute findings.  Attempt to ambulate the patient are unsuccessful due to generalized weakness. The patient does not hold himself in a sitting position in the bed, nor does he attempt to stand.   Differential includes MS exacerbation vs COVID infection vs psychogenic.   Discussed with hospitalist who is concerned regarding MS exacerbation. Neurology was consulted and recommends MRI brain, c-spine and t-spine to evaluate for acute MS flare.   MRI studies do not show any active demyelinization, MS stable from prior studies.   The patient is evaluated by Dr. Julian Reil, hospitalist, who agrees to admit for further testing to determine cause of weakness given multifactorial medical and psychiatric history. Appreciate his help with the patient's care.    Elpidio Anis, PA-C 07/17/20 0962    Dione Booze, MD 07/17/20 [id number]

## 2020-07-16 NOTE — ED Triage Notes (Signed)
Pt here found in hid shower by his mom is on blood thinners pt states that he hit his head no obvious injury ,

## 2020-07-16 NOTE — Discharge Instructions (Addendum)
Information on my medicine - ELIQUIS (apixaban)  Why was Eliquis prescribed for you? Eliquis was prescribed to treat blood clots that may have been found in the veins of your legs (deep vein thrombosis) or in your lungs in the past (pulmonary embolism) and to reduce the risk of them occurring again.  What do You need to know about Eliquis ? Your current dose is ONE 5 mg tablet taken TWICE daily.  Eliquis may be taken with or without food.   Try to take the dose about the same time in the morning and in the evening. If you have difficulty swallowing the tablet whole please discuss with your pharmacist how to take the medication safely.  Take Eliquis exactly as prescribed and DO NOT stop taking Eliquis without talking to the doctor who prescribed the medication.  Stopping may increase your risk of developing a new blood clot.  Refill your prescription before you run out.  After discharge, you should have regular check-up appointments with your healthcare provider that is prescribing your Eliquis.    What do you do if you miss a dose? If a dose of ELIQUIS is not taken at the scheduled time, take it as soon as possible on the same day and twice-daily administration should be resumed. The dose should not be doubled to make up for a missed dose.  Important Safety Information A possible side effect of Eliquis is bleeding. You should call your healthcare provider right away if you experience any of the following: ? Bleeding from an injury or your nose that does not stop. ? Unusual colored urine (red or dark brown) or unusual colored stools (red or black). ? Unusual bruising for unknown reasons. ? A serious fall or if you hit your head (even if there is no bleeding).  Some medicines may interact with Eliquis and might increase your risk of bleeding or clotting while on Eliquis. To help avoid this, consult your healthcare provider or pharmacist prior to using any new prescription or  non-prescription medications, including herbals, vitamins, non-steroidal anti-inflammatory drugs (NSAIDs) and supplements.  This website has more information on Eliquis (apixaban): http://www.eliquis.com/eliquis/home

## 2020-07-16 NOTE — Telephone Encounter (Signed)
VM from patients mom requesting rx be called in for both IM and PO Haldol. Reviewed record and has Haldol IM available. I reviewed with pharmacy his po dose and they reported he is on both 10 mg and 5 mg. I dont see 5 mg on his medication list. Will consult with Dr Evelene Croon re his Haldol po meds doses and need for it to be called in to [facility].

## 2020-07-17 ENCOUNTER — Emergency Department (HOSPITAL_COMMUNITY): Payer: Medicare Other

## 2020-07-17 DIAGNOSIS — R531 Weakness: Secondary | ICD-10-CM | POA: Diagnosis not present

## 2020-07-17 DIAGNOSIS — U071 COVID-19: Secondary | ICD-10-CM | POA: Diagnosis present

## 2020-07-17 DIAGNOSIS — I10 Essential (primary) hypertension: Secondary | ICD-10-CM | POA: Diagnosis not present

## 2020-07-17 DIAGNOSIS — F201 Disorganized schizophrenia: Secondary | ICD-10-CM | POA: Diagnosis not present

## 2020-07-17 DIAGNOSIS — G35 Multiple sclerosis: Secondary | ICD-10-CM

## 2020-07-17 DIAGNOSIS — F79 Unspecified intellectual disabilities: Secondary | ICD-10-CM

## 2020-07-17 LAB — CBC WITH DIFFERENTIAL/PLATELET
Abs Immature Granulocytes: 0.02 10*3/uL (ref 0.00–0.07)
Basophils Absolute: 0 10*3/uL (ref 0.0–0.1)
Basophils Relative: 0 %
Eosinophils Absolute: 0 10*3/uL (ref 0.0–0.5)
Eosinophils Relative: 0 %
HCT: 47.8 % (ref 39.0–52.0)
Hemoglobin: 14.4 g/dL (ref 13.0–17.0)
Immature Granulocytes: 0 %
Lymphocytes Relative: 18 %
Lymphs Abs: 0.8 10*3/uL (ref 0.7–4.0)
MCH: 24.8 pg — ABNORMAL LOW (ref 26.0–34.0)
MCHC: 30.1 g/dL (ref 30.0–36.0)
MCV: 82.4 fL (ref 80.0–100.0)
Monocytes Absolute: 1 10*3/uL (ref 0.1–1.0)
Monocytes Relative: 22 %
Neutro Abs: 2.7 10*3/uL (ref 1.7–7.7)
Neutrophils Relative %: 60 %
Platelets: 184 10*3/uL (ref 150–400)
RBC: 5.8 MIL/uL (ref 4.22–5.81)
RDW: 14.2 % (ref 11.5–15.5)
WBC: 4.6 10*3/uL (ref 4.0–10.5)
nRBC: 0 % (ref 0.0–0.2)

## 2020-07-17 LAB — HIV ANTIBODY (ROUTINE TESTING W REFLEX): HIV Screen 4th Generation wRfx: NONREACTIVE

## 2020-07-17 LAB — COMPREHENSIVE METABOLIC PANEL
ALT: 90 U/L — ABNORMAL HIGH (ref 0–44)
AST: 168 U/L — ABNORMAL HIGH (ref 15–41)
Albumin: 4.1 g/dL (ref 3.5–5.0)
Alkaline Phosphatase: 92 U/L (ref 38–126)
Anion gap: 13 (ref 5–15)
BUN: 14 mg/dL (ref 6–20)
CO2: 23 mmol/L (ref 22–32)
Calcium: 9.4 mg/dL (ref 8.9–10.3)
Chloride: 103 mmol/L (ref 98–111)
Creatinine, Ser: 1.08 mg/dL (ref 0.61–1.24)
GFR, Estimated: >60 mL/min (ref >60)
Glucose, Bld: 79 mg/dL (ref 70–99)
Potassium: 3.8 mmol/L (ref 3.5–5.1)
Sodium: 139 mmol/L (ref 135–145)
Total Bilirubin: 0.4 mg/dL (ref 0.3–1.2)
Total Protein: 6.9 g/dL (ref 6.5–8.1)

## 2020-07-17 LAB — CBG MONITORING, ED
Glucose-Capillary: 79 mg/dL (ref 70–99)
Glucose-Capillary: 76 mg/dL (ref 70–99)
Glucose-Capillary: 95 mg/dL (ref 70–99)

## 2020-07-17 LAB — D-DIMER, QUANTITATIVE: D-Dimer, Quant: <0.27 ug/mL-FEU (ref 0.00–0.50)

## 2020-07-17 LAB — PROCALCITONIN: Procalcitonin: 0.21 ng/mL

## 2020-07-17 LAB — C-REACTIVE PROTEIN: CRP: 4.7 mg/dL — ABNORMAL HIGH (ref <1.0)

## 2020-07-17 MED ORDER — LATANOPROST 0.005 % OP SOLN
1.0000 [drp] | Freq: Every day | OPHTHALMIC | Status: DC
  Administered 2020-07-18 – 2020-07-19 (×2): 1 [drp] via OPHTHALMIC
  Filled 2020-07-17: qty 2.5

## 2020-07-17 MED ORDER — GADOBUTROL 1 MMOL/ML IV SOLN
10.0000 mL | Freq: Once | INTRAVENOUS | Status: AC | PRN
  Administered 2020-07-17: 10 mL via INTRAVENOUS

## 2020-07-17 MED ORDER — SODIUM CHLORIDE 0.9 % IV SOLN: 100.0000 mg | Freq: Once | INTRAVENOUS | Status: DC

## 2020-07-17 MED ORDER — ASENAPINE MALEATE 5 MG SL SUBL
10.0000 mg | SUBLINGUAL_TABLET | Freq: Two times a day (BID) | SUBLINGUAL | Status: DC
  Administered 2020-07-17 – 2020-07-19 (×5): 10 mg via SUBLINGUAL
  Filled 2020-07-17 (×6): qty 2

## 2020-07-17 MED ORDER — ACETAMINOPHEN 325 MG PO TABS
650.0000 mg | ORAL_TABLET | Freq: Four times a day (QID) | ORAL | Status: DC | PRN
  Administered 2020-07-17: 650 mg via ORAL
  Filled 2020-07-17: qty 2

## 2020-07-17 MED ORDER — METOPROLOL TARTRATE 5 MG/5ML IV SOLN
2.5000 mg | Freq: Once | INTRAVENOUS | Status: AC | PRN
  Administered 2020-07-18: 2.5 mg via INTRAVENOUS
  Filled 2020-07-17: qty 5

## 2020-07-17 MED ORDER — SODIUM CHLORIDE 0.9 % IV BOLUS
1000.0000 mL | Freq: Once | INTRAVENOUS | Status: AC
  Administered 2020-07-17: 1000 mL via INTRAVENOUS

## 2020-07-17 MED ORDER — HALOPERIDOL 5 MG PO TABS
10.0000 mg | ORAL_TABLET | Freq: Two times a day (BID) | ORAL | Status: DC
  Administered 2020-07-17 – 2020-07-20 (×7): 10 mg via ORAL
  Filled 2020-07-17 (×8): qty 2

## 2020-07-17 MED ORDER — SODIUM CHLORIDE 0.9 % IV SOLN
100.0000 mg | Freq: Every day | INTRAVENOUS | Status: DC
  Administered 2020-07-18 – 2020-07-20 (×3): 100 mg via INTRAVENOUS
  Filled 2020-07-17 (×3): qty 20

## 2020-07-17 MED ORDER — ONDANSETRON HCL 4 MG PO TABS: 4.0000 mg | ORAL_TABLET | Freq: Four times a day (QID) | ORAL | Status: DC | PRN

## 2020-07-17 MED ORDER — SODIUM CHLORIDE 0.9 % IV SOLN
200.0000 mg | Freq: Once | INTRAVENOUS | Status: AC
  Administered 2020-07-17: 200 mg via INTRAVENOUS
  Filled 2020-07-17: qty 40

## 2020-07-17 MED ORDER — HYDRALAZINE HCL 25 MG PO TABS
25.0000 mg | ORAL_TABLET | Freq: Four times a day (QID) | ORAL | Status: DC | PRN
  Administered 2020-07-17 (×2): 25 mg via ORAL
  Filled 2020-07-17 (×2): qty 1

## 2020-07-17 MED ORDER — APIXABAN 5 MG PO TABS
5.0000 mg | ORAL_TABLET | Freq: Two times a day (BID) | ORAL | Status: DC
  Administered 2020-07-17 – 2020-07-20 (×7): 5 mg via ORAL
  Filled 2020-07-17 (×7): qty 1

## 2020-07-17 MED ORDER — AMLODIPINE BESYLATE 10 MG PO TABS
10.0000 mg | ORAL_TABLET | Freq: Every day | ORAL | Status: DC
  Administered 2020-07-17 – 2020-07-20 (×4): 10 mg via ORAL
  Filled 2020-07-17: qty 1
  Filled 2020-07-17: qty 2
  Filled 2020-07-17: qty 1
  Filled 2020-07-17: qty 2

## 2020-07-17 MED ORDER — BENZTROPINE MESYLATE 1 MG PO TABS
1.0000 mg | ORAL_TABLET | Freq: Two times a day (BID) | ORAL | Status: DC
  Administered 2020-07-17 – 2020-07-20 (×7): 1 mg via ORAL
  Filled 2020-07-17 (×7): qty 1

## 2020-07-17 MED ORDER — MELATONIN 5 MG PO TABS
10.0000 mg | ORAL_TABLET | Freq: Every day | ORAL | Status: DC
  Administered 2020-07-17 – 2020-07-19 (×3): 10 mg via ORAL
  Filled 2020-07-17 (×4): qty 2

## 2020-07-17 MED ORDER — ONDANSETRON HCL 4 MG/2ML IJ SOLN: 4.0000 mg | Freq: Four times a day (QID) | INTRAMUSCULAR | Status: DC | PRN

## 2020-07-17 NOTE — ED Notes (Signed)
Lunch Tray Ordered @ 1107. 

## 2020-07-17 NOTE — Hospital Course (Addendum)
30 year old man PMH multiple sclerosis on infusion of medication every 8 months, last dose in October, pulmonary embolism presented after a fall at home after which she could not get up secondary to generalized weakness.  Initial concern for MS flare per family.  4 days prior was first anniversary of his father's death.  Admitted for generalized weakness in the context of COVID-19 infection.   A & P  Generalized weakness in the context of multiple sclerosis, COVID infection. --Exam nonfocal.  MRI brain cervical thoracic spine no evidence of acute demyelination. 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 Doubt significance will d/w neurosurgery for any recommendations. --PT eval  Multiple sclerosis on ocrelizumab every 8 months, last October --Appears stable.  COVID-19 infection without respiratory compromise, risk factors including obesity, multiple sclerosis on active immunosuppression with ocrelizumab -- Fortunately appears well, no hypoxia or shortness of breath, chest x-ray clear --Remdesivir.  Steroids not indicated at this time.  CXR on admit: no acute disease Fever: 1/10 Oxygen requirement: RA Antibiotics: none Remdesivir: 1/11 > Steroids: not indicated Actemra/baricitinib: not indicated Vitamin C and Zinc Proning: if can tolerate   Inflammatory markers: CRP: 4.7 >  D-dimer: <.27 >  Procalcitonin: .21   PMH pulmonary embolism --Continue apixaban  ADHD, bipolar 1 disorder, schizophrenia --Appears stable.  Continue Asenapine, Cogentin, Haldol,

## 2020-07-17 NOTE — Progress Notes (Addendum)
PROGRESS NOTE  Brandon [name] BBC:[id number] DOB: 1990-09-25 DOA: 07/16/2020 PCP: Maudie Flakes, FNP  Brief History   30 year old man PMH multiple sclerosis on infusion of medication every 8 months last in October, pulmonary embolism presented after a fall at home after which she could not get up secondary to generalized weakness.  Initial concern for MS flare per family.  4 days prior was first anniversary of his father's death.  Admitted for generalized weakness in the context of COVID-19 infection.   A & P  Generalized weakness in the context of multiple sclerosis, COVID infection. --Exam nonfocal.  MRI brain cervical thoracic spine no evidence of acute demyelination. 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 Doubt significance will d/w neurosurgery for any recommendations. --PT eval  Multiple sclerosis on ocrelizumab every 8 months, last October --Appears stable.  COVID-19 infection without respiratory compromise, risk factors including obesity, multiple sclerosis on active immunosuppression with ocrelizumab -- Fortunately appears well, no hypoxia or shortness of breath, chest x-ray clear --Remdesivir.  Steroids not indicated at this time.  . CXR on admit: no acute disease . Fever: 1/10 . Oxygen requirement: RA . Antibiotics: none . Remdesivir: 1/11 > . Steroids: not indicated . Actemra/baricitinib: not indicated . Vitamin C and Zinc . Proning: if can tolerate   Inflammatory markers: . CRP: 4.7 >  . D-dimer: <.27 >  . Procalcitonin: .21   PMH pulmonary embolism --Continue apixaban  ADHD, bipolar 1 disorder, schizophrenia --Appears stable.  Continue Asenapine, Cogentin, Haldol,   Disposition Plan:  Discussion: PT  eval, continue tx as above, if stable, can likely return home 1/12.  Status is: Observation  The patient remains OBS appropriate and will d/c before 2 midnights.  Dispo: The patient is from: Home              Anticipated d/c is to: Home              Anticipated d/c date is: 1 day              Patient currently is not medically stable to d/c.  DVT prophylaxis:  apixaban (ELIQUIS) tablet 5 mg    Code Status: Full Code Family Communication:   Brendia Sacks, MD  [facility] Direct contact: see [email or web address] (further directions at bottom of note if needed) 7PM-7AM contact night coverage as at bottom of note 07/17/2020, 12:38 PM  LOS: 0 days   Interval History/Subjective  CC: weakness  Feels ok, breathing ok, no complaints other than neck pain from "crook in neck"  Objective   Vitals:  Vitals:   07/17/20 0908 07/17/20 1040  BP: (!) 132/104 103/85  Pulse: 87 92  Resp: 16 15  Temp: 98.7 F (37.1 C)   SpO2: 93% 95%    Exam:  Constitutional:   . Appears calm and comfortable ENMT:  . grossly normal hearing  Respiratory:  . CTA bilaterally, no w/r/r.  . Respiratory effort normal.  Cardiovascular:  . RRR, no m/r/g . No LE extremity edema   Abdomen:  . soft Musculoskeletal:  . RUE, LUE, RLE, LLE   . Able to move all extremities. Strength BLE appears grossly unremarkable Psychiatric:  . Mental status o Mood to difficult to gauge, affect odd  I have personally reviewed the following:   Today's Data  .  CBG stable . Transaminases slightly higher today but under 200 . CBC unremarkable  Scheduled Meds: . apixaban  5 mg Oral BID  . haloperidol decanoate  200 mg Intramuscular Q30 days   Continuous Infusions: . [START ON 07/18/2020] remdesivir 100 mg in NS 100 mL      Principal Problem:   Generalized weakness Active Problems:   HTN (hypertension)   Multiple sclerosis (HCC)   Disorganized schizophrenia (HCC)   Unspecified intellectual disabilities    COVID-19 virus infection   COVID-19   LOS: 0 days   How to contact the [facility] Attending or Consulting provider 7A - 7P or covering provider during after hours 7P -7A, for this patient?  1. Check the care team in [facility] and look for a) attending/consulting TR[facility] provider listed and b) the [facility] team listed 2. Log into [email or web address] and use [place]'s universal password to access. If you do not have the password, please contact the hospital operator. 3. Locate the [facility] provider you are looking for under [facility] and page to a number that you can be directly reached. 4. If you still have difficulty reaching the provider, please page the [facility] (Director on Call) for the Hospitalists listed on amion for assistance.    Prolonged services approx 10:30-11:15

## 2020-07-17 NOTE — ED Notes (Signed)
This RN notified MD Pt HR sinus tach and if any intervention can be done, Per MD will order fluids

## 2020-07-17 NOTE — ED Notes (Signed)
F/U Breakfast Tray Ordered @ 0956-per Candise Bowens, RN called By Marylene Land

## 2020-07-17 NOTE — Consult Note (Signed)
   Providing Compassionate, Quality Care - Together  Neurosurgery Consult  Referring physician: Dr. Irene Limbo Reason for referral: Thoracic stenosis  Chief Complaint: Fall  History of Present Illness: This is a 30 year old male with a history of multiple sclerosis, pulmonary embolism and had a fall at home and due to generalized weakness could not get up.  Emergency medical services were called and he was 30 year old male with to the emergency department.  There was concern for MS flare as he has generalized weakness when he typically has a flare.  Work-up was performed for his MS flare which revealed thoracic stenosis from degenerative disc disease at T7-8 with mild stenosis.  At this time the patient is a poor historian and is not very compliant with questioning.  He is lying in the emergency department with minimal effort following commands.  He is awake.    Medications: I have reviewed the patient's current medications. Allergies: No Known Allergies  History reviewed. No pertinent family history. Social History:  has no history on file for tobacco use, alcohol use, and drug use.  ROS: 14 point review of systems was obtained which all pertinent positives and negatives listed in HPI above  Physical Exam:  Vital signs in last 24 hours: Temp:  [98 F (36.7 C)-98.3 F (36.8 C)] 98 F (36.7 C) (07/25 1814) Pulse Rate:  [58-128] 65 (07/26 0746) Resp:  [11-18] 14 (07/26 0217) BP: (138-182)/(65-125) 153/88 (07/26 0700) SpO2:  [91 %-98 %] 96 % (07/26 0746)  PE: Awake alert, minimally communicative Tracking around the room PERRLA EOMI Face symmetric Bilateral upper extremities 3/5 Bilateral lower extremity proximal 2/5, dorsiflexion/plantarflexion 4/5  Imaging: MRI of the thoracic spine shows mild stenosis at T7-8 from degenerative disease with mild mass-effect upon the spinal cord with no significant cord compression or stenosis. MRI of the brain and cervical spine revealed  chronic MS changes.  Impression/Assessment:  30 year old male with  1. MS 2.  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

## 2020-07-17 NOTE — ED Notes (Signed)
Patient transported to MRI 

## 2020-07-17 NOTE — ED Notes (Signed)
Dinner tray Ordered @ 1715 

## 2020-07-17 NOTE — Progress Notes (Signed)
PROGRESS NOTE  Brandon [name] ZOX:[id number] DOB: [birth year] DOA: 07/16/2020 PCP: Maudie Flakes, FNP  Brief History   30 year old man PMH multiple sclerosis on infusion of medication every 8 months last in October, pulmonary embolism presented after a fall at home after which she could not get up secondary to generalized weakness.  Initial concern for MS flare per family.  4 days prior was first anniversary of his father's death.  Admitted for generalized weakness in the context of COVID-19 infection.   A & P  Generalized weakness in the context of multiple sclerosis, COVID infection. --Exam nonfocal.  MRI brain cervical thoracic spine no evidence of acute demyelination. 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 Doubt significance will d/w neurosurgery for any recommendations. --PT eval  Multiple sclerosis on ocrelizumab every 8 months, last October --Appears stable.  COVID-19 infection without respiratory compromise, risk factors including obesity, multiple sclerosis on active immunosuppression with ocrelizumab -- Fortunately appears well, no hypoxia or shortness of breath, chest x-ray clear --Remdesivir.  Steroids not indicated at this time.  . CXR on admit: no acute disease . Fever: 1/10 . Oxygen requirement: RA . Antibiotics: none . Remdesivir: 1/11 > . Steroids: not indicated . Actemra/baricitinib: not indicated . Vitamin C and Zinc . Proning: if can tolerate   Inflammatory markers: . CRP: 4.7 >  . D-dimer: <.27 >  . Procalcitonin: .21   PMH pulmonary embolism --Continue apixaban  ADHD, bipolar 1 disorder, schizophrenia --Appears stable.  Continue Asenapine, Cogentin, Haldol,   Disposition Plan:  Discussion: PT  eval, continue tx as above, if stable, can likely return home 1/12.  Status is: Observation  The patient remains OBS appropriate and will d/c before 2 midnights.  Dispo: The patient is from: Home              Anticipated d/c is to: Home              Anticipated d/c date is: 1 day              Patient currently is not medically stable to d/c.  DVT prophylaxis:  apixaban (ELIQUIS) tablet 5 mg    Code Status: Full Code Family Communication: updated mother by phone  Brendia Sacks, MD  [facility] Direct contact: see [email or web address] (further directions at bottom of note if needed) 7PM-7AM contact night coverage as at bottom of note 07/17/2020, 8:03 PM  LOS: 0 days   Interval History/Subjective  CC: weakness  Feels ok, breathing ok, no complaints other than neck pain from "crook in neck"  Objective   Vitals:  Vitals:   07/17/20 1600 07/17/20 1914  BP: (!) 152/106 (!) 169/117  Pulse: (!) 104 (!) 121  Resp: 16 16  Temp:    SpO2: 100% 98%    Exam:  Constitutional:   . Appears calm and comfortable ENMT:  . grossly normal hearing  Respiratory:  . CTA bilaterally, no w/r/r.  . Respiratory effort normal.  Cardiovascular:  . RRR, no m/r/g . No LE extremity edema   Abdomen:  . soft Musculoskeletal:  . RUE, LUE, RLE, LLE   . Able to move all extremities. Strength BLE appears grossly unremarkable Psychiatric:  . Mental status o Mood to difficult to gauge, affect odd  I have personally reviewed the following:  Today's Data  . CBG stable . Transaminases slightly higher today but under 200 . CBC unremarkable  Scheduled Meds: . amLODipine  10 mg Oral Daily  . apixaban  5 mg Oral BID  . asenapine  10 mg Sublingual BID  . benztropine  1 mg Oral BID  . haloperidol  10 mg Oral BID  . haloperidol decanoate  200 mg Intramuscular Q30 days  . latanoprost  1 drop Both Eyes QHS  . melatonin  10 mg Oral QHS   Continuous Infusions: . [START ON 07/18/2020] remdesivir 100  mg in NS 100 mL      Principal Problem:   Generalized weakness Active Problems:   HTN (hypertension)   Multiple sclerosis (HCC)   Disorganized schizophrenia (HCC)   Unspecified intellectual disabilities   COVID-19 virus infection   COVID-19   LOS: 0 days   How to contact the [facility] Attending or Consulting provider 7A - 7P or covering provider during after hours 7P -7A, for this patient?  1. Check the care team in [facility] and look for a) attending/consulting TRH provider listed and b) the [facility] team listed 2. Log into [email or web address] and use [place]'s universal password to access. If you do not have the password, please contact the hospital operator. 3. Locate the [facility] provider you are looking for under [facility] and page to a number that you can be directly reached. 4. If you still have difficulty reaching the provider, please page the [facility] (Director on Call) for the Hospitalists listed on amion for assistance.    Prolonged services approx 10:30-11:15

## 2020-07-17 NOTE — H&P (Signed)
History and Physical    Brandon LawrenceRaymond S Alia WUJ:[id number] DOB: 12/26/1990 DOA: 07/16/2020  PCP: Maudie FlakesAnderson, Shane D, FNP  Patient coming from: Home  I have personally briefly reviewed patient's [facility]  Chief Complaint: Generalized weakness  HPI: Brandon [name] is a 30 y.o. male with medical history significant of MS, schizophrenia, BPD, PE on eliquis.  Pt presents to ED after a fall.  Pt found in shower by his mom.  Pt also had fall 3 days ago.  For the past 3 days pt has been generally weak per his mother.  Today he fell in shower.  Mom was able to find him.  No LOC, no injury however pt too weak to get up by himself.  This does not normally happen however when this does occur pt is normally having an MS flare.  Pt has been compliant with all his meds.  Mother denies any fever or chills, no vomiting, diarrhea, cough, sick contacts.  Mother is legal guardian.  Pt receives infusions for MS every 8 months, last in Oct.  Mom is wheelchair bound, unable to lift patient (who is 136kg).  4 days ago was actually the first anniversary of his fathers death.   ED Course: Pt with fever 101.7.  COVID positive.  WBC nl.  LFTs slightly elevated.  Pt unable to ambulate.  MRI w/wo of Brain and C spine: severe MS disease, but no active demyelinating lesions and minimal progression since 2019.   Review of Systems: As per HPI, otherwise all review of systems negative.  Past Medical History:  Diagnosis Date  . ADHD (attention deficit hyperactivity disorder)   . Bipolar 1 disorder (HCC)   . Chronic back pain   . Chronic constipation   . Chronic neck pain   . Hypertension   . Multiple sclerosis (HCC) 05/20/2013   left sided weakness, dysarthria  . Non-compliance   . Obesity   . Pulmonary embolism (HCC)   . Schizophrenia (HCC)   . Stroke [facility](HCC)    left sided deficits - pt's mother denies this    Past Surgical History:  Procedure Laterality Date  . NO  PAST SURGERIES    . None    . TOOTH EXTRACTION N/A 06/24/2019   Procedure: DENTAL RESTORATION/EXTRACTION OF TEETH NUMBER ONE, SIXTEEN, SEVENTEEN, NINETEEN, THIRTY-TWO;  Surgeon: Ocie DoyneJensen, Scott, DDS;  Location: [facility] OR;  Service: Oral Surgery;  Laterality: N/A;     reports that he has been smoking cigarettes. He has been smoking about 0.25 packs per day. He has never used smokeless tobacco. He reports previous alcohol use. He reports previous drug use. Drug: Marijuana.  No Known Allergies  Family History  Problem Relation Age of Onset  . Diabetes Mother   . ADD / ADHD Brother      Prior to Admission medications   Medication Sig Start Date End Date Taking? Authorizing Provider  acetaminophen (TYLENOL) 500 MG tablet Take 1 tablet (500 mg total) by mouth every 6 (six) hours as needed. 06/21/20   Fayrene Helperran, Bowie, PA-C  amLODipine (NORVASC) 10 MG tablet Take 10 mg by mouth daily.    [provider]  apixaban (ELIQUIS) 5 MG TABS tablet Take 5 mg by mouth 2 (two) times daily.     [provider]  Asenapine Maleate 10 MG SUBL Place 1 tablet (10 mg total) under the tongue in the morning, at noon, and at bedtime. 07/16/20   Zena AmosKaur, Mandeep, MD  benztropine (COGENTIN) 1 MG  tablet Take 1 tablet (1 mg total) by mouth 2 (two) times daily. 07/16/20   Zena Amos, MD  Calcium Citrate-Vitamin D (CALCIUM + D PO) Take 1 tablet by mouth daily.    [provider]  docusate sodium (COLACE) 100 MG capsule Take 400 mg by mouth at bedtime.     [provider]  ferrous gluconate (FERGON) 240 (27 FE) MG tablet Take 240 mg by mouth daily.    [provider]  haloperidol (HALDOL) 10 MG tablet Take 1 tablet (10 mg total) by mouth 2 (two) times daily. 07/16/20   Zena Amos, MD  haloperidol decanoate (HALDOL DECANOATE) 100 MG/ML injection Inject 2 mLs (200 mg total) into the muscle every 30 (thirty) days. 07/16/20   Zena Amos, MD  latanoprost (XALATAN) 0.005 % ophthalmic solution  Place 1 drop into both eyes at bedtime.    [provider]  Melatonin 10 MG CAPS TAKE ONE TABLET BY MOUTH AT BEDTIME 06/25/20   Zena Amos, MD  metFORMIN (GLUCOPHAGE-XR) 500 MG 24 hr tablet Take 500 mg by mouth daily with breakfast.    [provider]  Multiple Vitamin (MULTIVITAMIN WITH MINERALS) TABS tablet Take 1 tablet by mouth daily.    [provider]  ocrelizumab in sodium chloride 0.9 % 500 mL CHANGE ::  Ocrevus 600mg  IV EVERY 8 (EIGHT) months. 11/10/19   01/10/20, NP  temazepam (RESTORIL) 30 MG capsule Take 1 capsule (30 mg total) by mouth at bedtime. 05/04/20   Money, 05/06/20, FNP  propranolol (INDERAL) 10 MG tablet Take 1 tablet (10 mg total) by mouth 2 (two) times daily. 05/03/15 01/03/20  01/05/20, NP    Physical Exam: Vitals:   07/17/20 0045 07/17/20 0115 07/17/20 0200 07/17/20 0507  BP: (!) 146/105 (!) 143/103 (!) 157/107 (!) 145/88  Pulse: (!) 104 100 100 95  Resp: 14 17 (!) 26 18  Temp:    99.9 F (37.7 C)  TempSrc:    Temporal  SpO2: 99% 97% 97% 93%    Constitutional: NAD, calm, comfortable Eyes: PERRL, lids and conjunctivae normal ENMT: Mucous membranes are moist. Posterior pharynx clear of any exudate or lesions.Normal dentition.  Neck: normal, supple, no masses, no thyromegaly Respiratory: clear to auscultation bilaterally, no wheezing, no crackles. Normal respiratory effort. No accessory muscle use.  Cardiovascular: Regular rate and rhythm, no murmurs / rubs / gallops. No extremity edema. 2+ pedal pulses. No carotid bruits.  Abdomen: no tenderness, no masses palpated. No hepatosplenomegaly. Bowel sounds positive.  Musculoskeletal: no clubbing / cyanosis. No joint deformity upper and lower extremities. Good ROM, no contractures. Normal muscle tone.  Skin: no rashes, lesions, ulcers. No induration Neurologic: Alert, Delayed speech, MAE, 5/5 symmetric grip strength BUE.  Plantar and dorsi flexion BLE. Psychiatric: Flat affect.   Short phrases.  Labs on Admission: I have personally reviewed following labs and imaging studies  CBC: Recent Labs  Lab 07/16/20 1916  WBC 7.3  NEUTROABS 5.9  HGB 14.3  HCT 44.4  MCV 81.2  PLT 197   Basic Metabolic Panel: Recent Labs  Lab 07/16/20 1916  NA 140  K 3.9  CL 104  CO2 23  GLUCOSE 89  BUN 12  CREATININE 0.97  CALCIUM 9.9   GFR: CrCl cannot be calculated (Unknown ideal weight.). Liver Function Tests: Recent Labs  Lab 07/16/20 1916  AST 117*  ALT 71*  ALKPHOS 97  BILITOT 0.6  PROT 7.0  ALBUMIN 4.2   No results for  input(s): LIPASE, AMYLASE in the last 168 hours. No results for input(s): AMMONIA in the last 168 hours. Coagulation Profile: Recent Labs  Lab 07/16/20 1913  INR 1.2   Cardiac Enzymes: No results for input(s): CKTOTAL, CKMB, CKMBINDEX, TROPONINI in the last 168 hours. BNP (last 3 results) No results for input(s): PROBNP in the last 8760 hours. HbA1C: No results for input(s): HGBA1C in the last 72 hours. CBG: No results for input(s): GLUCAP in the last 168 hours. Lipid Profile: No results for input(s): CHOL, HDL, LDLCALC, TRIG, CHOLHDL, LDLDIRECT in the last 72 hours. Thyroid Function Tests: No results for input(s): TSH, T4TOTAL, FREET4, T3FREE, THYROIDAB in the last 72 hours. Anemia Panel: No results for input(s): VITAMINB12, FOLATE, FERRITIN, TIBC, IRON, RETICCTPCT in the last 72 hours. Urine analysis:    Component Value Date/Time   COLORURINE AMBER (A) 07/16/2020 2322   APPEARANCEUR CLEAR 07/16/2020 2322   LABSPEC 1.030 07/16/2020 2322   PHURINE 5.0 07/16/2020 2322   GLUCOSEU NEGATIVE 07/16/2020 2322   HGBUR NEGATIVE 07/16/2020 2322   HGBUR negative 01/14/2010 1451   BILIRUBINUR NEGATIVE 07/16/2020 2322   KETONESUR 5 (A) 07/16/2020 2322   PROTEINUR 30 (A) 07/16/2020 2322   UROBILINOGEN 1.0 03/13/2015 0223   NITRITE NEGATIVE 07/16/2020 2322   LEUKOCYTESUR NEGATIVE 07/16/2020 2322    Radiological Exams on  Admission: CT Head Wo Contrast  Result Date: 07/16/2020 CLINICAL DATA:  Altered mental status and neck injury. EXAM: CT HEAD WITHOUT CONTRAST CT CERVICAL SPINE WITHOUT CONTRAST TECHNIQUE: Multidetector CT imaging of the head and cervical spine was performed following the standard protocol without intravenous contrast. Multiplanar CT image reconstructions of the cervical spine were also generated. COMPARISON:  January 25, 2018.  September 22, 2017. FINDINGS: CT HEAD FINDINGS Brain: Stable periventricular white matter low densities are noted consistent with history of multiple sclerosis. No mass effect or midline shift is noted. Ventricular size is within normal limits. There is no evidence of mass lesion, hemorrhage or acute infarction. Vascular: No hyperdense vessel or unexpected calcification. Skull: Normal. Negative for fracture or focal lesion. Sinuses/Orbits: No acute finding. Other: None. CT CERVICAL SPINE FINDINGS Alignment: Normal. Skull base and vertebrae: No acute fracture. No primary bone lesion or focal pathologic process. Soft tissues and spinal canal: No prevertebral fluid or swelling. No visible canal hematoma. Disc levels:  Normal. Upper chest: Negative. Other: None. IMPRESSION: 1. Stable periventricular white matter low densities are noted consistent with history of multiple sclerosis. No acute intracranial abnormality seen. 2. Normal cervical spine. Electronically Signed   By: Lupita Raider M.D.   On: 07/16/2020 10:13   CT Cervical Spine Wo Contrast  Result Date: 07/16/2020 CLINICAL DATA:  Altered mental status and neck injury. EXAM: CT HEAD WITHOUT CONTRAST CT CERVICAL SPINE WITHOUT CONTRAST TECHNIQUE: Multidetector CT imaging of the head and cervical spine was performed following the standard protocol without intravenous contrast. Multiplanar CT image reconstructions of the cervical spine were also generated. COMPARISON:  January 25, 2018.  September 22, 2017. FINDINGS: CT HEAD FINDINGS Brain: Stable  periventricular white matter low densities are noted consistent with history of multiple sclerosis. No mass effect or midline shift is noted. Ventricular size is within normal limits. There is no evidence of mass lesion, hemorrhage or acute infarction. Vascular: No hyperdense vessel or unexpected calcification. Skull: Normal. Negative for fracture or focal lesion. Sinuses/Orbits: No acute finding. Other: None. CT CERVICAL SPINE FINDINGS Alignment: Normal. Skull base and vertebrae: No acute fracture. No primary bone lesion or focal pathologic  process. Soft tissues and spinal canal: No prevertebral fluid or swelling. No visible canal hematoma. Disc levels:  Normal. Upper chest: Negative. Other: None. IMPRESSION: 1. Stable periventricular white matter low densities are noted consistent with history of multiple sclerosis. No acute intracranial abnormality seen. 2. Normal cervical spine. Electronically Signed   By: Lupita Raider M.D.   On: 07/16/2020 10:13   MR Brain W and Wo Contrast  Result Date: 07/17/2020 CLINICAL DATA:  30 year old male with history of multiple sclerosis. Altered mental status. Positive COVID-19. EXAM: MRI HEAD WITHOUT AND WITH CONTRAST TECHNIQUE: Multiplanar, multiecho pulse sequences of the brain and surrounding structures were obtained without and with intravenous contrast. CONTRAST:  10mL GADAVIST GADOBUTROL 1 MMOL/ML IV SOLN COMPARISON:  Truncated brain MRI 06/06/2019. Brain MRI without and with contrast 01/26/2018 and earlier. FINDINGS: Brain: No restricted diffusion to suggest acute infarction. No midline shift, mass effect, evidence of mass lesion, extra-axial collection or acute intracranial hemorrhage. Cervicomedullary junction and pituitary are within normal limits. Extensive, confluent chronic cerebral white matter T2 and FLAIR hyperintensity and associated white matter volume loss with mild ex vacuo enlargement of the ventricles, more pronounced in the right hemisphere. Severe  chronic volume loss of the corpus callosum. The cortex appears generally spared as before. Chronic involvement of the bilateral deep white matter capsules, more apparent on the left (series 10, image 14). Pronounced involvement of the posterior right cerebral peduncle is more conspicuous on FLAIR since 2019 (series 11, image 11). Less discrete involvement of the brainstem elsewhere appears stable. And comparatively mild involvement of the central left cerebellum is stable. No enhancing or diffusion restricted lesions identified. No chronic cerebral blood products. No definite dural thickening or abnormal enhancement otherwise. Vascular: Major intracranial vascular flow voids are stable since 2019. The major dural venous sinuses are enhancing and appear to be patent. Skull and upper cervical spine: Dedicated cervical spine imaging reported separately today. Visualized bone marrow signal is within normal limits. Sinuses/Orbits: Grossly stable orbits. Paranasal sinuses and mastoids are stable and well pneumatized. Other: Visible internal auditory structures appear normal. Scalp and face soft tissues appear negative. IMPRESSION: 1. Very severe chronic demyelinating disease with pronounced white matter volume loss appears only mildly progressed since a 2019 MRI (right cerebral peduncle). No active demyelination identified. 2. No new intracranial abnormality identified. 3. Cervical spine reported separately today. Electronically Signed   By: Odessa Fleming M.D.   On: 07/17/2020 04:32   MR Cervical Spine W or Wo Contrast  Result Date: 07/17/2020 CLINICAL DATA:  30 year old male with history of multiple sclerosis. Altered mental status. Positive COVID-19. EXAM: MRI CERVICAL SPINE WITHOUT AND WITH CONTRAST TECHNIQUE: Multiplanar and multiecho pulse sequences of the cervical spine, to include the craniocervical junction and cervicothoracic junction, were obtained without and with intravenous contrast. CONTRAST:  10mL GADAVIST  GADOBUTROL 1 MMOL/ML IV SOLN in conjunction with contrast enhanced imaging of the brain reported separately. COMPARISON:  Brain MRI today reported separately. Most recent cervical spine MRI is 01/18/2014. Cervical spine CT 07/16/2020 and earlier. FINDINGS: Alignment: Chronic straightening of cervical lordosis. No spondylolisthesis. Vertebrae: No marrow edema or evidence of acute osseous abnormality. Visualized bone marrow signal is within normal limits. Cord: Confluent roughly 3 cm long segment of abnormal spinal cord STIR and mild T2 hyperintensity at the C4 and C5 vertebral levels (series 11, image 8) was apparent in 2015 although appears mildly larger since that time. Axial images suggest fairly holo-cord involvement (series 13, image 17). No associated cord expansion. No  associated enhancement. Elsewhere fairly subtle cervical spinal cord heterogeneity, such as in the left posterior hemi cord at the C1-C2 level on series 12, image 3 which appears larger since 2015. No cervical cord enhancement.  No dural thickening or enhancement. Posterior Fossa, vertebral arteries, paraspinal tissues: Cervicomedullary junction appears to remain within normal limits. Brain reported separately today. Preserved major vascular flow voids in the neck. Partially retropharyngeal course of the right carotid. Otherwise negative visible neck soft tissues. Disc levels: No significant cervical spine degeneration. No spinal or neural foraminal stenosis. IMPRESSION: 1. Chronic cervical spinal cord demyelination, with a dominant C4 and C5 level cord lesion. Mild progression since a 2015 MRI with no active demyelination identified. 2. No superimposed cervical spine degeneration. Electronically Signed   By: Odessa Fleming M.D.   On: 07/17/2020 04:39   MR THORACIC SPINE W WO CONTRAST  Result Date: 07/17/2020 CLINICAL DATA:  30 year old male with history of multiple sclerosis. Altered mental status. Positive COVID-19. EXAM: MRI THORACIC WITHOUT AND  WITH CONTRAST TECHNIQUE: Multiplanar and multiecho pulse sequences of the thoracic spine were obtained without and with intravenous contrast. CONTRAST:  6mL GADAVIST GADOBUTROL 1 MMOL/ML IV SOLN in conjunction with contrast enhanced imaging of the brain and cervical spine reported separately. COMPARISON:  Brain and cervical spine MRI today reported separately. No prior thoracic MRI. FINDINGS: Limited cervical spine imaging:  Reported separately today. Thoracic spine segmentation:  Appears to be normal. Alignment: Preserved thoracic kyphosis. No significant scoliosis. No spondylolisthesis. Vertebrae: No marrow edema or evidence of acute osseous abnormality. Visualized bone marrow signal is within normal limits. Cord: Spinal cord detail on sagittal images limited by large body habitus. There is no convincing thoracic cord volume loss. The conus medullaris appears grossly normal at T12-L1. No definite thoracic cord demyelination. No abnormal intradural enhancement or dural thickening; axial postcontrast images are degraded by motion. Paraspinal and other soft tissues: Small or trace bilateral layering pleural effusions (series 23, image 15). Otherwise grossly negative lungs, mediastinum. Negative visible abdominal viscera. Thoracic paraspinal soft tissues are within normal limits. Disc levels: No significant degeneration T1-T2 through T5-T6. T6-T7: Mild facet hypertrophy without stenosis. T7-T8: Disc space loss with small central and cephalad disc extrusion best seen on series 19, image 10. Superimposed mild epidural lipomatosis. Overall mild spinal stenosis with mild spinal cord mass effect. No associated cord signal abnormality here. No foraminal involvement or stenosis. Degenerative facet hypertrophy T8-T9 through T11-T12 with up to mild associated bilateral neural foraminal stenosis. T12-L1 is negative. IMPRESSION: 1. No thoracic spinal cord demyelination is identified. 2. Isolated thoracic disc degeneration at  T7-T8 where a cephalad disc extrusion results in mild degenerative spinal stenosis and mild spinal cord mass effect in conjunction with epidural lipomatosis. No associated cord signal abnormality. 3. Lower thoracic facet degenerative hypertrophy with up to mild lower thoracic neural foraminal stenosis. 4. Trace layering pleural effusions. Electronically Signed   By: Odessa Fleming M.D.   On: 07/17/2020 04:48   DG Chest Port 1 View  Result Date: 07/16/2020 CLINICAL DATA:  30 year old male with sepsis. EXAM: PORTABLE CHEST 1 VIEW COMPARISON:  Chest radiograph dated 12/04/2017 FINDINGS: There is shallow inspiration. Minimal bibasilar atelectasis. No focal consolidation, pleural effusion, pneumothorax. The cardiac silhouette is within limits. No acute osseous pathology. IMPRESSION: Shallow inspiration with minimal bibasilar atelectasis. Electronically Signed   By: Elgie Collard M.D.   On: 07/16/2020 18:58    EKG: Independently reviewed.  Assessment/Plan Principal Problem:   Generalized weakness Active Problems:   HTN (hypertension)  Multiple sclerosis (HCC)   Disorganized schizophrenia (HCC)   Unspecified intellectual disabilities   COVID-19 virus infection   COVID-19    1. Generalized weakness - 1. Multifactorial 2. Suspect recrudescence of neurologic MS symptoms due to acute illness (COVID-19). 1. No active demyelinating MS lesions in brain nor C spine identified on todays MRI. 3. Also suspect some degree of psychogenic overlay / depression.  4 days ago was first anniversary of fathers death. 4. Plan to treat COVID-19 5. May also benefit from psych consult. 2. COVID-19 1. Pt with risk factors including obesity, MS, and immunosuppression with ocrelizumab (especially the last). 2. COVID pathway 3. No O2 requirement currently 4. Start remdesivir per pharm 5. Daily labs 3. MS - 1. Stable, no active lesions on todays MRI 2. ocrelizumab every 8 months, last was in Oct 4. Schizophrenia  - 1. Cont home psych meds when med rec completed 5. BPD - 1. Suspect pt currently depressed given flat affect, fact that 4 days ago is first anniversary of fathers death doesn't help. 2.  May want to get psych consult 6. HTN - 1. Cont home BP meds when med rec complete 7. H/o PE - 1. Cont eliquis  DVT prophylaxis: eliquis Code Status: Full Family Communication: No family in room Disposition Plan: TBD, home vs rehab Consults called: None Admission status: Place in 69   Kamiryn Bezanson M. DO [facility]  How to contact the [facility] Attending or Consulting provider 7A - 7P or covering provider during after hours 7P -7A, for this patient?  1. Check the care team in [facility] and look for a) attending/consulting [facility] provider listed and b) the [facility] team listed 2. Log into [email or web address]  Amion Physician Scheduling and messaging for groups and whole hospitals  On call and physician scheduling software for group practices, residents, hospitalists and other medical providers for call, clinic, rotation and shift schedules. On[employer] is a hospital-wide system for scheduling doctors and paging doctors on call. EasyPlot is for scientific plotting and data analysis.  [email or web address]  and use [place]'s universal password to access. If you do not have the password, please contact the hospital operator.  3. Locate the [facility] provider you are looking for under [facility] and page to a number that you can be directly reached. 4. If you still have difficulty reaching the provider, please page the [facility] (Director on Call) for the Hospitalists listed on amion for assistance.  07/17/2020, 6:04 AM

## 2020-07-18 ENCOUNTER — Telehealth (HOSPITAL_COMMUNITY): Payer: Medicare Other | Admitting: Psychiatry

## 2020-07-18 ENCOUNTER — Other Ambulatory Visit: Payer: Self-pay

## 2020-07-18 DIAGNOSIS — R531 Weakness: Secondary | ICD-10-CM | POA: Diagnosis not present

## 2020-07-18 DIAGNOSIS — I10 Essential (primary) hypertension: Secondary | ICD-10-CM | POA: Diagnosis present

## 2020-07-18 DIAGNOSIS — Z86711 Personal history of pulmonary embolism: Secondary | ICD-10-CM | POA: Diagnosis not present

## 2020-07-18 DIAGNOSIS — F17210 Nicotine dependence, cigarettes, uncomplicated: Secondary | ICD-10-CM | POA: Diagnosis present

## 2020-07-18 DIAGNOSIS — F201 Disorganized schizophrenia: Secondary | ICD-10-CM | POA: Diagnosis present

## 2020-07-18 DIAGNOSIS — M4804 Spinal stenosis, thoracic region: Secondary | ICD-10-CM | POA: Diagnosis present

## 2020-07-18 DIAGNOSIS — Z7901 Long term (current) use of anticoagulants: Secondary | ICD-10-CM | POA: Diagnosis not present

## 2020-07-18 DIAGNOSIS — Z6841 Body Mass Index (BMI) 40.0 and over, adult: Secondary | ICD-10-CM | POA: Diagnosis not present

## 2020-07-18 DIAGNOSIS — F319 Bipolar disorder, unspecified: Secondary | ICD-10-CM | POA: Diagnosis present

## 2020-07-18 DIAGNOSIS — F909 Attention-deficit hyperactivity disorder, unspecified type: Secondary | ICD-10-CM | POA: Diagnosis present

## 2020-07-18 DIAGNOSIS — F79 Unspecified intellectual disabilities: Secondary | ICD-10-CM | POA: Diagnosis present

## 2020-07-18 DIAGNOSIS — Z7984 Long term (current) use of oral hypoglycemic drugs: Secondary | ICD-10-CM | POA: Diagnosis not present

## 2020-07-18 DIAGNOSIS — M5134 Other intervertebral disc degeneration, thoracic region: Secondary | ICD-10-CM | POA: Diagnosis present

## 2020-07-18 DIAGNOSIS — Z993 Dependence on wheelchair: Secondary | ICD-10-CM | POA: Diagnosis not present

## 2020-07-18 DIAGNOSIS — Z79899 Other long term (current) drug therapy: Secondary | ICD-10-CM | POA: Diagnosis not present

## 2020-07-18 DIAGNOSIS — D849 Immunodeficiency, unspecified: Secondary | ICD-10-CM | POA: Diagnosis present

## 2020-07-18 DIAGNOSIS — G35 Multiple sclerosis: Secondary | ICD-10-CM | POA: Diagnosis present

## 2020-07-18 DIAGNOSIS — U071 COVID-19: Secondary | ICD-10-CM | POA: Diagnosis present

## 2020-07-18 DIAGNOSIS — Z8673 Personal history of transient ischemic attack (TIA), and cerebral infarction without residual deficits: Secondary | ICD-10-CM | POA: Diagnosis not present

## 2020-07-18 LAB — CBC WITH DIFFERENTIAL/PLATELET
Abs Immature Granulocytes: 0.03 10*3/uL (ref 0.00–0.07)
Basophils Absolute: 0 10*3/uL (ref 0.0–0.1)
Basophils Relative: 0 %
Eosinophils Absolute: 0 10*3/uL (ref 0.0–0.5)
Eosinophils Relative: 0 %
HCT: 47.2 % (ref 39.0–52.0)
Hemoglobin: 14.3 g/dL (ref 13.0–17.0)
Immature Granulocytes: 0 %
Lymphocytes Relative: 16 %
Lymphs Abs: 1.1 10*3/uL (ref 0.7–4.0)
MCH: 25.6 pg — ABNORMAL LOW (ref 26.0–34.0)
MCHC: 30.3 g/dL (ref 30.0–36.0)
MCV: 84.6 fL (ref 80.0–100.0)
Monocytes Absolute: 0.8 10*3/uL (ref 0.1–1.0)
Monocytes Relative: 10 %
Neutro Abs: 5.3 10*3/uL (ref 1.7–7.7)
Neutrophils Relative %: 74 %
Platelets: 187 10*3/uL (ref 150–400)
RBC: 5.58 MIL/uL (ref 4.22–5.81)
RDW: 14.3 % (ref 11.5–15.5)
WBC: 7.3 10*3/uL (ref 4.0–10.5)
nRBC: 0 % (ref 0.0–0.2)

## 2020-07-18 LAB — URINE CULTURE: Culture: NO GROWTH

## 2020-07-18 LAB — COMPREHENSIVE METABOLIC PANEL
ALT: 87 U/L — ABNORMAL HIGH (ref 0–44)
AST: 121 U/L — ABNORMAL HIGH (ref 15–41)
Albumin: 3.6 g/dL (ref 3.5–5.0)
Alkaline Phosphatase: 85 U/L (ref 38–126)
Anion gap: 12 (ref 5–15)
BUN: 9 mg/dL (ref 6–20)
CO2: 21 mmol/L — ABNORMAL LOW (ref 22–32)
Calcium: 9 mg/dL (ref 8.9–10.3)
Chloride: 105 mmol/L (ref 98–111)
Creatinine, Ser: 0.93 mg/dL (ref 0.61–1.24)
GFR, Estimated: >60 mL/min (ref >60)
Glucose, Bld: 90 mg/dL (ref 70–99)
Potassium: 3.6 mmol/L (ref 3.5–5.1)
Sodium: 138 mmol/L (ref 135–145)
Total Bilirubin: 0.6 mg/dL (ref 0.3–1.2)
Total Protein: 6.4 g/dL — ABNORMAL LOW (ref 6.5–8.1)

## 2020-07-18 LAB — TSH: TSH: 2.636 u[IU]/mL (ref 0.350–4.500)

## 2020-07-18 LAB — C-REACTIVE PROTEIN: CRP: 4.8 mg/dL — ABNORMAL HIGH (ref <1.0)

## 2020-07-18 LAB — D-DIMER, QUANTITATIVE: D-Dimer, Quant: <0.27 ug/mL-FEU (ref 0.00–0.50)

## 2020-07-18 MED ORDER — METHYLPREDNISOLONE SODIUM SUCC 40 MG IJ SOLR
40.0000 mg | Freq: Two times a day (BID) | INTRAMUSCULAR | Status: DC
  Administered 2020-07-19 – 2020-07-20 (×3): 40 mg via INTRAVENOUS
  Filled 2020-07-18 (×3): qty 1

## 2020-07-18 MED ORDER — METHYLPREDNISOLONE SODIUM SUCC 125 MG IJ SOLR
125.0000 mg | Freq: Once | INTRAMUSCULAR | Status: AC
  Administered 2020-07-18: 125 mg via INTRAVENOUS
  Filled 2020-07-18: qty 2

## 2020-07-18 NOTE — ED Notes (Signed)
MS   Breakfast ordered  

## 2020-07-18 NOTE — ED Notes (Signed)
Called pt's mother and updated her on pts care. Pt's mother is concerned that he is having an MS flare up. In the past she has had PT/OT and in home NT come help. Pt's mother is his caregiver, but she is wheelchair bound. She has no other help for him. When he has the MS flare ups, he cannot walk. It appears he may be having one. Will notify MD to see if we can arrange for home care.

## 2020-07-18 NOTE — ED Notes (Signed)
Attempted to get pt up to walk to assess room air sats while ambulating. Pt states he is still very tired and unable to help this RN get up without a lot of assistance. Will try again after pt has time to eat breakfast. Room air sats at rest 100%.

## 2020-07-18 NOTE — ED Notes (Signed)
This RN notified MD Pt HR remains elevated and need further interventions per MD will place orders for IV metoprolol

## 2020-07-18 NOTE — Progress Notes (Signed)
   07/18/20 1732  Vitals  Temp 99.4 F (37.4 C)  Temp Source Oral  BP (!) 145/106  MAP (mmHg) 116  BP Location Right Arm  BP Method Automatic  Patient Position (if appropriate) Lying  Pulse Rate 90  Pulse Rate Source Monitor  ECG Heart Rate 91  Resp 18  MEWS COLOR  MEWS Score Color Green  Oxygen Therapy  SpO2 96 %  O2 Device Room Air  Patient Activity (if Appropriate) In bed  Pain Assessment  Pain Scale 0-10  Pain Score 0  Height and Weight  Height 6\' 1"  (1.854 m)  Weight (!) 146.6 kg  Type of Scale Used Bed  Type of Weight Actual  BSA (Calculated - sq m) 2.75 sq meters  BMI (Calculated) 42.65  Weight in (lb) to have BMI = 25 189.1   Admitted from the ED. Oriented to room. Updated patient's mom on plan of care.

## 2020-07-18 NOTE — Progress Notes (Signed)
PROGRESS NOTE  Brandon [name] QPY:[id number] DOB: 09-07-1990 DOA: 07/16/2020 PCP: Maudie Flakes, FNP  Brief History   30 year old man PMH multiple sclerosis on infusion of medication every 8 months last in October, pulmonary embolism presented after a fall at home after which she could not get up secondary to generalized weakness.  Initial concern for MS flare per family.  4 days prior was first anniversary of his father's death.  Admitted for generalized weakness in the context of COVID-19 infection.   A & P   Generalized weakness in the context of multiple sclerosis, COVID infection. --Exam nonfocal.  MRI brain cervical thoracic spine no evidence of acute demyelination. 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 Doubt significance will d/w neurosurgery for any recommendations. --PT eval pending --Initiate steroids to cover both covid infection and possible MS flair as below  Multiple sclerosis on ocrelizumab every 8 months, last October, questionably in acute flare --Continue steroids as above  COVID-19 infection without respiratory compromise, risk factors including obesity, multiple sclerosis on active immunosuppression with ocrelizumab -- Fortunately appears well, no hypoxia or shortness of breath, chest x-ray clear --Remdesivir.  Steroids not indicated at this time.  . CXR on admit: no acute disease . Fever: 1/10 . Oxygen requirement: RA . Antibiotics: none . Remdesivir: 1/11 > . Steroids: initiated primarily for MS as above . Actemra/baricitinib: not indicated . Vitamin C and Zinc . Proning: if can tolerate Recent Labs    07/17/20 0819 07/18/20 0302  DDIMER <0.27 <0.27  CRP 4.7* 4.8*     PMH pulmonary  embolism --Continue apixaban  ADHD, bipolar 1 disorder, schizophrenia --Appears stable.  Continue Asenapine, Cogentin, Haldol,   Disposition Plan:  Discussion: PT eval, continue tx as above, if stable, can likely return home 1/12.  Status is: Observation  The patient remains OBS appropriate and will d/c before 2 midnights.  Dispo: The patient is from: Home              Anticipated d/c is to: Home              Anticipated d/c date is: 1 day              Patient currently is not medically stable to d/c.  DVT prophylaxis:  apixaban (ELIQUIS) tablet 5 mg    Code Status: Full Code Family Communication: Patient on phone with mother  Carma Leaven DO  [facility] Direct contact: Secure chat/epic messenger  7PM-7AM contact night coverage through Amion 07/18/2020, 5:09 PM  LOS: 0 days   Interval History/Subjective  CC: weakness  Minimally verbal today - poorly interactive, ROS unable to be determined. No acute issues/events overnight.  Objective   Vitals:  Vitals:   07/18/20 1300 07/18/20 1634  BP: (!) 132/110 (!) 141/106  Pulse: (!) 122 (!) 113  Resp: 17 18  Temp: 98.9 F (37.2 C) 99 F (37.2 C)  SpO2: 99% 98%    Exam:  Constitutional:   . Appears calm and comfortable ENMT:  . grossly normal hearing  Respiratory:  . CTA bilaterally, no w/r/r.  . Respiratory effort normal.  Cardiovascular:  . RRR, no m/r/g . No LE extremity edema   Abdomen:  . soft Musculoskeletal:  . RUE, LUE, RLE, LLE   . Able to move all extremities. Strength  BLE appears grossly unremarkable Psychiatric:  . Mental status o Mood to difficult to gauge, somewhat depressed, poorly interactive/engaging  I have personally reviewed the following:   Today's Data  . CBG stable . Transaminases slightly higher today but under 200 . CBC unremarkable  Scheduled Meds: . amLODipine  10 mg Oral Daily  . apixaban  5 mg Oral BID  . asenapine  10 mg Sublingual BID  . benztropine  1 mg  Oral BID  . haloperidol  10 mg Oral BID  . latanoprost  1 drop Both Eyes QHS  . melatonin  10 mg Oral QHS  . [START ON 07/19/2020] methylPREDNISolone (SOLU-MEDROL) injection  40 mg Intravenous Q12H   Continuous Infusions: . remdesivir 100 mg in NS 100 mL Stopped (07/18/20 1127)    Principal Problem:   Generalized weakness Active Problems:   HTN (hypertension)   Multiple sclerosis (HCC)   Disorganized schizophrenia (HCC)   Unspecified intellectual disabilities   COVID-19 virus infection   COVID-19   LOS: 0 days

## 2020-07-18 NOTE — ED Notes (Signed)
This RN clean and change pt gown, linen and place male purwick on pt

## 2020-07-19 DIAGNOSIS — R531 Weakness: Secondary | ICD-10-CM | POA: Diagnosis not present

## 2020-07-19 LAB — CBC WITH DIFFERENTIAL/PLATELET
Abs Immature Granulocytes: 0.03 10*3/uL (ref 0.00–0.07)
Basophils Absolute: 0 10*3/uL (ref 0.0–0.1)
Basophils Relative: 0 %
Eosinophils Absolute: 0 10*3/uL (ref 0.0–0.5)
Eosinophils Relative: 0 %
HCT: 42.9 % (ref 39.0–52.0)
Hemoglobin: 14.2 g/dL (ref 13.0–17.0)
Immature Granulocytes: 0 %
Lymphocytes Relative: 6 %
Lymphs Abs: 0.6 10*3/uL — ABNORMAL LOW (ref 0.7–4.0)
MCH: 26.3 pg (ref 26.0–34.0)
MCHC: 33.1 g/dL (ref 30.0–36.0)
MCV: 79.6 fL — ABNORMAL LOW (ref 80.0–100.0)
Monocytes Absolute: 0.6 10*3/uL (ref 0.1–1.0)
Monocytes Relative: 6 %
Neutro Abs: 7.6 10*3/uL (ref 1.7–7.7)
Neutrophils Relative %: 88 %
Platelets: 187 10*3/uL (ref 150–400)
RBC: 5.39 MIL/uL (ref 4.22–5.81)
RDW: 14.1 % (ref 11.5–15.5)
WBC: 8.8 10*3/uL (ref 4.0–10.5)
nRBC: 0 % (ref 0.0–0.2)

## 2020-07-19 LAB — COMPREHENSIVE METABOLIC PANEL
ALT: 79 U/L — ABNORMAL HIGH (ref 0–44)
AST: 77 U/L — ABNORMAL HIGH (ref 15–41)
Albumin: 3.6 g/dL (ref 3.5–5.0)
Alkaline Phosphatase: 81 U/L (ref 38–126)
Anion gap: 11 (ref 5–15)
BUN: 12 mg/dL (ref 6–20)
CO2: 23 mmol/L (ref 22–32)
Calcium: 9 mg/dL (ref 8.9–10.3)
Chloride: 103 mmol/L (ref 98–111)
Creatinine, Ser: 1.07 mg/dL (ref 0.61–1.24)
GFR, Estimated: >60 mL/min (ref >60)
Glucose, Bld: 144 mg/dL — ABNORMAL HIGH (ref 70–99)
Potassium: 3.6 mmol/L (ref 3.5–5.1)
Sodium: 137 mmol/L (ref 135–145)
Total Bilirubin: 0.4 mg/dL (ref 0.3–1.2)
Total Protein: 6.6 g/dL (ref 6.5–8.1)

## 2020-07-19 LAB — D-DIMER, QUANTITATIVE: D-Dimer, Quant: <0.27 ug/mL-FEU (ref 0.00–0.50)

## 2020-07-19 LAB — C-REACTIVE PROTEIN: CRP: 9.3 mg/dL — ABNORMAL HIGH (ref <1.0)

## 2020-07-19 MED ORDER — ASENAPINE MALEATE 5 MG SL SUBL
10.0000 mg | SUBLINGUAL_TABLET | Freq: Three times a day (TID) | SUBLINGUAL | Status: DC
  Administered 2020-07-19 – 2020-07-20 (×3): 10 mg via SUBLINGUAL
  Filled 2020-07-19 (×7): qty 2

## 2020-07-19 NOTE — Evaluation (Signed)
Physical Therapy Evaluation Patient Details Name: Brandon [name] MRN: [id number] DOB: 1991-05-18 Today's Date: 07/19/2020   History of Present Illness  30 y.o. male admitted on 07/16/20 for fall and weakness.  Found to be COVID 19 + without respiratory compromise.  Significant PMH of MS with ocrelizumab infusions every 8 months, schizophrenia, PE, obesity, HTN, bipolar, ADHD.  Clinical Impression  Pt is weak, slow to process and high fall risk.  I was able to get him standing EOB with mod assist overall with RW. He normally walks and bathes and dresses himself with the supervision of his mom at home.  He is significantly different now.  We stood EOB multiple times with support from PT and RW.  He sat unexpectedly several times from standing back onto the bed, so PT did not feel safe attempting gait even a short distance around the room.  I spoke with his mom on the phone who has cared for him with the assist of [facility] services in the past when he has been weak.  She is ok taking him home as long as transport, equipment and HH services are available.  PT to follow acutely for deficits listed below.      Follow Up Recommendations Home health PT;Supervision/Assistance - 24 hour    Equipment Recommendations  Rolling walker with 5" wheels;3in1 (PT);Wheelchair (measurements PT);Wheelchair cushion (measurements PT);Other (comment) (20x20 WC, wide 3-in-1 and wide RW)    Recommendations for Other Services OT consult     Precautions / Restrictions Precautions Precautions: Fall Precaution Comments: 2 recent falls at home, one outside and one in the bathroom.      Mobility  Bed Mobility Overal bed mobility: Needs Assistance Bed Mobility: Supine to Sit;Sit to Supine     Supine to sit: Mod assist;HOB elevated Sit to supine: Mod assist;HOB elevated   General bed mobility comments: Mod assist to pull up to sitting from supine, difficulty initiating movement without assist and multimodal cues. Assist  to lift both legs back into bed, again difficulty initiating movement to command without significant extra time and cues.    Transfers Overall transfer level: Needs assistance Equipment used: Rolling walker (2 wheeled) Transfers: Sit to/from Stand Sit to Stand: Mod assist;From elevated surface         General transfer comment: Mod assist to stand using momentum initially with R leg blocked, but ultimately not needed.  Multiple attempts each stand before success.  Assist needed to power up and stabilize the RW.  Ambulation/Gait Ambulation/Gait assistance: Mod assist   Assistive device: Rolling walker (2 wheeled) Gait Pattern/deviations: Step-to pattern     General Gait Details: pt preformed pre gait in standing stepping and marching in place, several instances of crashing back down to the bed, making this therapist not feel safe walking away from the support surface.  He was able to side step x 2 bouts, but continued to unexpectedly crash with uncontrolled descent down to the bed.  Stairs            Wheelchair Mobility    Modified Rankin (Stroke Patients Only)       Balance Overall balance assessment: Needs assistance Sitting-balance support: Feet supported;Bilateral upper extremity supported Sitting balance-Leahy Scale: Poor Sitting balance - Comments: when pt removes his hands he slowly drifts backwards.   Standing balance support: Bilateral upper extremity supported Standing balance-Leahy Scale: Poor Standing balance comment: bil UE supported on RW and support from PT  Pertinent Vitals/Pain Pain Assessment: Faces Pain Score: 0-No pain    Home Living Family/patient expects to be discharged to:: Private residence Living Arrangements: Parent Available Help at Discharge: Family;Available 24 hours/day Type of Home: Apartment Home Access: Level entry     Home Layout: One level Home Equipment: None      Prior Function  Level of Independence: Needs assistance   Gait / Transfers Assistance Needed: Pt walked without AD PTA, goes to a day program every morning for 4 hours (they pick him up)  ADL's / Homemaking Assistance Needed: Per mom, he normally bathes and dresses himself.        Hand Dominance        Extremity/Trunk Assessment   Upper Extremity Assessment Upper Extremity Assessment: Defer to OT evaluation    Lower Extremity Assessment Lower Extremity Assessment: RLE deficits/detail;LLE deficits/detail RLE Deficits / Details: right leg seems mildly weaker at the hip compared to L leg with seated MMT 3+/5 LLE Deficits / Details: seated MMT 4/5 throughout.    Cervical / Trunk Assessment Cervical / Trunk Assessment: Normal  Communication      Cognition Arousal/Alertness: Awake/alert Behavior During Therapy: WFL for tasks assessed/performed Overall Cognitive Status: Impaired/Different from baseline Area of Impairment: Orientation;Attention;Memory;Following commands;Safety/judgement;Awareness;Problem solving                 Orientation Level: Disoriented to;Time;Situation Current Attention Level: Sustained   Following Commands: Follows one step commands inconsistently;Follows one step commands with increased time Safety/Judgement: Decreased awareness of safety;Decreased awareness of deficits Awareness: Intellectual Problem Solving: Slow processing;Decreased initiation;Difficulty sequencing;Requires verbal cues;Requires tactile cues General Comments: Pt is very slow to process and initiate movement, needs repetition and extra processing time.  Could not recall the day or time of day (kept asking "when is it"      General Comments General comments (skin integrity, edema, etc.): VSS on RA throughout session.    Exercises     Assessment/Plan    PT Assessment Patient needs continued PT services  PT Problem List Decreased strength;Decreased activity tolerance;Decreased  balance;Decreased mobility;Decreased coordination;Decreased cognition;Decreased knowledge of use of DME;Decreased safety awareness;Decreased knowledge of precautions;Obesity       PT Treatment Interventions DME instruction;Gait training;Functional mobility training;Therapeutic activities;Therapeutic exercise;Balance training;Cognitive remediation;Patient/family education;Neuromuscular re-education;Wheelchair mobility training    PT Goals (Current goals can be found in the Care Plan section)  Acute Rehab PT Goals Patient Stated Goal: none stated, mom wants him stronger PT Goal Formulation: With patient/family Time For Goal Achievement: 08/02/20 Potential to Achieve Goals: Good    Frequency Min 3X/week   Barriers to discharge        Co-evaluation               AM-PAC PT "6 Clicks" Mobility  Outcome Measure Help needed turning from your back to your side while in a flat bed without using bedrails?: A Lot Help needed moving from lying on your back to sitting on the side of a flat bed without using bedrails?: A Lot Help needed moving to and from a bed to a chair (including a wheelchair)?: A Lot Help needed standing up from a chair using your arms (e.g., wheelchair or bedside chair)?: A Lot Help needed to walk in hospital room?: A Lot Help needed climbing 3-5 steps with a railing? : Total 6 Click Score: 11    End of Session   Activity Tolerance: Patient tolerated treatment well Patient left: in bed;with call bell/phone within reach;with bed alarm set Nurse Communication: Mobility status PT  Visit Diagnosis: Muscle weakness (generalized) (M62.81);Difficulty in walking, not elsewhere classified (R26.2)    Time: 3532-9924 PT Time Calculation (min) (ACUTE ONLY): 39 min   Charges:   PT Evaluation $PT Eval Moderate Complexity: 1 Mod PT Treatments $Therapeutic Activity: 23-37 mins       Corinna Capra, PT, DPT  Acute Rehabilitation [phone] pager [phone]) [phone]  office

## 2020-07-19 NOTE — TOC Initial Note (Signed)
Transition of Care [facility]) - Initial/Assessment Note    Patient Details  Name: Brandon [name] MRN: [id number] Date of Birth: [birth year]  [facility]) CM/SW Contact:    Lockie Pares, RN Phone Number: 07/19/2020, 7:45 AM  Clinical Narrative:                 30 year old with history of MS clots (PE) admitted with a fall, weakness. Mother thought perhaps it was a MS flare. He is on steriods. Has had [facility] in the past for Home health. PT consult pending will look to them for recommendations. CM with follow for needs  Expected Discharge Plan: Home w Home Health Services Barriers to Discharge: Continued Medical Work up   Patient Goals and CMS Choice        Expected Discharge Plan and Services Expected Discharge Plan: Home w Home Health Services   Discharge Planning Services: CM Consult   Living arrangements for the past 2 months: Apartment                                      Prior Living Arrangements/Services Living arrangements for the past 2 months: Apartment Lives with:: Parents Patient language and need for interpreter reviewed:: Yes        Need for Family Participation in Patient Care: Yes (Comment) Care giver support system in place?: Yes (comment)   Criminal Activity/Legal Involvement Pertinent to Current Situation/Hospitalization: No - Comment as needed  Activities of Daily Living Home Assistive Devices/Equipment: Other (Comment) (UTA) ADL Screening (condition at time of admission) Patient's cognitive ability adequate to safely complete daily activities?: No Is the patient deaf or have difficulty hearing?: No Does the patient have difficulty seeing, even when wearing glasses/contacts?: No Does the patient have difficulty concentrating, remembering, or making decisions?: Yes Patient able to express need for assistance with ADLs?: Yes Does the patient have difficulty dressing or bathing?: Yes Independently performs ADLs?:  No Communication: Needs assistance Is this a change from baseline?: Change from baseline, expected to last >3 days Dressing (OT): Needs assistance Is this a change from baseline?: Change from baseline, expected to last >3 days Grooming: Needs assistance Is this a change from baseline?: Change from baseline, expected to last >3 days Feeding: Needs assistance Is this a change from baseline?: Change from baseline, expected to last >3 days Bathing: Dependent Is this a change from baseline?: Change from baseline, expected to last >3 days Toileting: Needs assistance Is this a change from baseline?: Change from baseline, expected to last >3days In/Out Bed: Dependent Is this a change from baseline?: Change from baseline, expected to last >3 days Walks in Home: Dependent Is this a change from baseline?: Change from baseline, expected to last >3 days Does the patient have difficulty walking or climbing stairs?: Yes Weakness of Legs: Both Weakness of Arms/Hands: Both  Permission Sought/Granted                  Emotional Assessment       Orientation: : Oriented to Self,Oriented to Place,Oriented to  Time,Oriented to Situation Alcohol / Substance Use: Not Applicable Psych Involvement: No (comment)  Admission diagnosis:  COVID [U07.1] COVID-19 [U07.1] COVID-19 virus infection [U07.1] Patient Active Problem List   Diagnosis Date Noted  . COVID-19 virus infection 07/17/2020  . COVID-19 07/17/2020  . Unspecified intellectual disabilities 01/27/2020  . Migraine headache 04/18/2019  . Generalized  weakness   . Pulmonary embolism (HCC) 08/12/2017  . Hyperglycemia 07/31/2017  . Multiple sclerosis exacerbation (HCC) 07/30/2017  . Acute encephalopathy 07/30/2017  . Schizoaffective disorder, bipolar type (HCC)   . Disorganized schizophrenia (HCC)   . Hallucinations 08/05/2014  . Auditory hallucination   . Left-sided weakness 01/18/2014  . Multiple sclerosis (HCC) 05/20/2013  . White  matter abnormality on MRI of brain 11/23/2012  . Abnormality of gait 11/23/2012  . HTN (hypertension) 07/04/2011  . 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 Obesity, Class III, BMI 40-49.9 (morbid obesity) (HCC) 01/14/2010  . ADHD 01/14/2010   PCP:  Maudie Flakes, FNP Pharmacy:   [facility] - [place], [place] - [address] [facility] [address] [place] [address] Phone: [phone] Fax: [phone]  [facility] - [place], [place] - [address] [place] [address] Phone: [phone] Fax: [phone]     Social Determinants of Health (SDOH) Interventions    Readmission Risk Interventions No flowsheet data found.

## 2020-07-19 NOTE — TOC Transition Note (Signed)
Transition of Care [facility]) - CM/SW Discharge Note   Patient Details  Name: Brandon [name] MRN: [id number] Date of Birth: 06-23-1991  Transition of Care [facility]) CM/SW Contact:  Lockie Pares, RN Phone Number: 07/19/2020, 4:08 PM   Clinical Narrative:    Spoke to patients mother. All DME ordered, she chose [facility] ( he has had them prior). [facility] for acceptance. He will DC by [facility] tomorrow once equipment gets there. She stated it is better to go to the back of the apartments ( turn right on [address] and go past trash receptabcles) it is easier for a stretcher to get in this way. Discussed wheelchair width, called DME back to order a 18 inch VS 20 as she is stating the door is narrow.      Barriers to Discharge: Continued Medical Work up   Patient Goals and CMS Choice        Discharge Placement                       Discharge Plan and Services   Discharge Planning Services: CM Consult            DME Arranged: High strength lightweight manual wheelchair with seat cushion DME Agency: [employer]   Time DME Agency Contacted: [phone] Representative spoke with at DME Agency: nielle will be delivered to house [facility] Arranged: RN,PT,OT,Nurse's Aide,Social Work [employer] Agency: [facility] Date [facility] Agency Contacted: 07/19/20 Time HH Agency Contacted: [phone] Representative spoke with at [facility] Agency: Lorenza Chick  Social Determinants of Health (SDOH) Interventions     Readmission Risk Interventions No flowsheet data found.

## 2020-07-19 NOTE — TOC Progression Note (Signed)
Transition of Care [facility]) - Progression Note    Patient Details  Name: Brandon [name] MRN: [id number] Date of Birth: 25-Dec-1990  [facility]) CM/SW Contact  Lockie Pares, RN Phone Number: 07/19/2020, 3:48 PM  Clinical Narrative:     Ordered DME and called [employer]. All DME will be delivered to Home.Possible discharge tomorrow, will need PTAR  Expected Discharge Plan: Home w Home Health Services Barriers to Discharge: Continued Medical Work up  Expected Discharge Plan and Services Expected Discharge Plan: Home w Home Health Services   Discharge Planning Services: CM Consult   Living arrangements for the past 2 months: Apartment                 DME Arranged: Dan Humphreys wide,3-N-1 DME Agency: [employer]   Time DME Agency Contacted: [id number] Representative spoke with at DME Agency: nielle will be delivered to house             Social Determinants of Health (SDOH) Interventions    Readmission Risk Interventions No flowsheet data found.

## 2020-07-19 NOTE — Progress Notes (Signed)
PROGRESS NOTE  Brandon [name] JJO:[id number] DOB: [birth year] DOA: 07/16/2020 PCP: Maudie Flakes, FNP  Brief History   30 year old man PMH multiple sclerosis on infusion of medication every 8 months last in October, pulmonary embolism presented after a fall at home after which she could not get up secondary to generalized weakness.  Initial concern for MS flare per family.  4 days prior was first anniversary of his father's death.  Admitted for generalized weakness in the context of COVID-19 infection.   A & P   Generalized weakness in the context of multiple sclerosis, COVID infection. -- Exam nonfocal.  MRI brain cervical thoracic spine no evidence of acute demyelination. 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 Doubt significance will d/w neurosurgery for any recommendations. -- PT eval recommending home health and ongoing PT -- Initiate steroids to cover both covid infection and possible MS flair (although less likely)  Multiple sclerosis on ocrelizumab every 8 months, last October, questionably in acute flare -- Continue steroids - prolonged taper at discharge  COVID-19 infection without respiratory compromise, risk factors including obesity, multiple sclerosis on active immunosuppression with ocrelizumab -- Fortunately appears well, no hypoxia or shortness of breath, chest x-ray clear --Remdesivir.  Steroids for MS as above  . CXR on admit: no acute disease . Fever: 1/10 . Oxygen requirement: RA . Antibiotics: none . Remdesivir: 1/11 > . Steroids: initiated primarily for MS as above . Actemra/baricitinib: not indicated . Vitamin C and Zinc . Proning: if can tolerate Recent Labs    07/17/20 0819 07/18/20 0302  07/19/20 0159  DDIMER <0.27 <0.27 <0.27  CRP 4.7* 4.8* 9.3*     PMH pulmonary embolism --Continue apixaban  ADHD, bipolar 1 disorder, schizophrenia --Appears stable.  Continue Asenapine, Cogentin, Haldol,   Disposition Plan:  Discussion: PT eval, continue tx as above, if stable, can likely return home 1/12.  Status is: Observation  The patient remains OBS appropriate and will d/c before 2 midnights.  Dispo: The patient is from: Home              Anticipated d/c is to: Home              Anticipated d/c date is: 24h              Patient currently is medically stable to d/c.  DVT prophylaxis:  apixaban (ELIQUIS) tablet 5 mg    Code Status: Full Code Family Communication: Mother updated previously - patient to update when she calls  Carma Leaven DO  [facility] Direct contact: Secure chat/epic messenger  7PM-7AM contact night coverage through Amion 07/19/2020, 8:05 AM  LOS: 1 day   Interval History/Subjective  CC: weakness  No acute issues/events overnight - still flat affect - poorly interactive but denies shortness of breath, headache, fevers, or chills.  Objective   Vitals:  Vitals:   07/18/20 2354 07/19/20 0321  BP: (!) 146/58 (!) 150/103  Pulse: 94 86  Resp: 20 20  Temp: 99.6 F (37.6 C) 99.4 F (37.4 C)  SpO2: 100% 94%    Exam:  Constitutional:   . Appears calm and comfortable ENMT:  . grossly normal hearing  Respiratory:  . CTA bilaterally, no w/r/r.  . Respiratory effort normal.  Cardiovascular:  . RRR, no m/r/g . No LE  extremity edema   Abdomen:  . soft Musculoskeletal:  . RUE, LUE, RLE, LLE   . Able to move all extremities. Strength BLE appears grossly unremarkable Psychiatric:  . Mental status o Flat affect, depressed mood  Scheduled Meds: . amLODipine  10 mg Oral Daily  . apixaban  5 mg Oral BID  . asenapine  10 mg Sublingual BID  . benztropine  1 mg Oral BID  . haloperidol  10 mg Oral BID  . latanoprost  1 drop Both  Eyes QHS  . melatonin  10 mg Oral QHS  . methylPREDNISolone (SOLU-MEDROL) injection  40 mg Intravenous Q12H   Continuous Infusions: . remdesivir 100 mg in NS 100 mL Stopped (07/18/20 1127)    Principal Problem:   Generalized weakness Active Problems:   HTN (hypertension)   Multiple sclerosis (HCC)   Disorganized schizophrenia (HCC)   Unspecified intellectual disabilities   COVID-19 virus infection   COVID-19   LOS: 1 day

## 2020-07-19 NOTE — Plan of Care (Signed)

## 2020-07-20 DIAGNOSIS — R531 Weakness: Secondary | ICD-10-CM | POA: Diagnosis not present

## 2020-07-20 LAB — CBC WITH DIFFERENTIAL/PLATELET
Abs Immature Granulocytes: 0.07 10*3/uL (ref 0.00–0.07)
Basophils Absolute: 0 10*3/uL (ref 0.0–0.1)
Basophils Relative: 0 %
Eosinophils Absolute: 0 10*3/uL (ref 0.0–0.5)
Eosinophils Relative: 0 %
HCT: 45.4 % (ref 39.0–52.0)
Hemoglobin: 13.9 g/dL (ref 13.0–17.0)
Immature Granulocytes: 0 %
Lymphocytes Relative: 3 %
Lymphs Abs: 0.5 10*3/uL — ABNORMAL LOW (ref 0.7–4.0)
MCH: 25.2 pg — ABNORMAL LOW (ref 26.0–34.0)
MCHC: 30.6 g/dL (ref 30.0–36.0)
MCV: 82.4 fL (ref 80.0–100.0)
Monocytes Absolute: 0.6 10*3/uL (ref 0.1–1.0)
Monocytes Relative: 3 %
Neutro Abs: 15.9 10*3/uL — ABNORMAL HIGH (ref 1.7–7.7)
Neutrophils Relative %: 94 %
Platelets: 189 10*3/uL (ref 150–400)
RBC: 5.51 MIL/uL (ref 4.22–5.81)
RDW: 13.8 % (ref 11.5–15.5)
WBC: 17.1 10*3/uL — ABNORMAL HIGH (ref 4.0–10.5)
nRBC: 0 % (ref 0.0–0.2)

## 2020-07-20 LAB — COMPREHENSIVE METABOLIC PANEL
ALT: 73 U/L — ABNORMAL HIGH (ref 0–44)
AST: 66 U/L — ABNORMAL HIGH (ref 15–41)
Albumin: 3.5 g/dL (ref 3.5–5.0)
Alkaline Phosphatase: 82 U/L (ref 38–126)
Anion gap: 13 (ref 5–15)
BUN: 19 mg/dL (ref 6–20)
CO2: 20 mmol/L — ABNORMAL LOW (ref 22–32)
Calcium: 8.8 mg/dL — ABNORMAL LOW (ref 8.9–10.3)
Chloride: 102 mmol/L (ref 98–111)
Creatinine, Ser: 0.89 mg/dL (ref 0.61–1.24)
GFR, Estimated: >60 mL/min (ref >60)
Glucose, Bld: 184 mg/dL — ABNORMAL HIGH (ref 70–99)
Potassium: 4.4 mmol/L (ref 3.5–5.1)
Sodium: 135 mmol/L (ref 135–145)
Total Bilirubin: 0.6 mg/dL (ref 0.3–1.2)
Total Protein: 6.7 g/dL (ref 6.5–8.1)

## 2020-07-20 LAB — C-REACTIVE PROTEIN: CRP: 6.9 mg/dL — ABNORMAL HIGH (ref <1.0)

## 2020-07-20 LAB — D-DIMER, QUANTITATIVE: D-Dimer, Quant: 0.3 ug/mL-FEU (ref 0.00–0.50)

## 2020-07-20 MED ORDER — PREDNISONE 10 MG PO TABS: ORAL_TABLET | ORAL | 0 refills | Status: AC

## 2020-07-20 NOTE — Evaluation (Signed)
Occupational Therapy Evaluation Patient Details Name: Brandon [name] MRN: [id number] DOB: [birth year] Today's Date: 07/20/2020    History of Present Illness 30 y.o. male admitted on 07/16/20 for fall and weakness.  Found to be COVID 19 + without respiratory compromise.  Significant PMH of MS with ocrelizumab infusions every 8 months, schizophrenia, PE, obesity, HTN, bipolar, ADHD.   Clinical Impression   Pt admitted with the above, and demonstrates the below listed deficits which include impaired cognition (unsure of baseline), impaired balance, generalized weakness, decreased activity tolerance.  He requires set up assist - max A for ADLs, and mod A (+2 for safety) with functional transfers.  He looses his balance posteriorly.  See below for vitals - he did have a bit of a drop of BP when standing.  He has periods of zoning out - unsure if this could be due to orthostasis, or due to psychiatric issues.  Per chart, he lives at home with his mother, and was able to perform ADLs mod I.  Recommend 24 hour assist at discharge, and HHOT.  W/C and [facility] transport already ordered.  Pt for discharge home today.  OT will sign off at this time.     Follow Up Recommendations  Home health OT;Supervision/Assistance - 24 hour    Equipment Recommendations  None recommended by OT    Recommendations for Other Services       Precautions / Restrictions Precautions Precautions: Fall Precaution Comments: 2 recent falls at home, one outside and one in the bathroom.      Mobility Bed Mobility Overal bed mobility: Needs Assistance Bed Mobility: Supine to Sit;Sit to Supine     Supine to sit: Mod assist;HOB elevated     General bed mobility comments: Pt requires cues to initiate task and assist to lift trunk.  He was able to scoot hips to EOB with min guard assist    Transfers Overall transfer level: Needs assistance Equipment used: Rolling walker (2 wheeled) Transfers: Sit to/from [employer] Sit to Stand: Mod assist;+2 safety/equipment;+2 physical assistance Stand pivot transfers: Mod assist;+2 safety/equipment       General transfer comment: assist to power up into standing, and assist for balance and safety as pt tends to zone out and leans posteriorly and to the left    Balance Overall balance assessment: Needs assistance Sitting-balance support: Feet supported;Bilateral upper extremity supported Sitting balance-Leahy Scale: Poor Sitting balance - Comments: pt able to maintain static sitting with min gaurd assist, but looses his balance when unsupported   Standing balance support: Bilateral upper extremity supported Standing balance-Leahy Scale: Poor Standing balance comment: bil UE supported on RW and support from PT                           ADL either performed or assessed with clinical judgement   ADL Overall ADL's : Needs assistance/impaired Eating/Feeding: Modified independent Eating/Feeding Details (indicate cue type and reason): pt occasionally spilling items.  Requires cues to move container closer to his mouth to avoid spilling Grooming: Wash/dry hands;Wash/dry face;Supervision/safety;Sitting   Upper Body Bathing: Supervision/ safety;Sitting   Lower Body Bathing: Moderate assistance;Sit to/from stand   Upper Body Dressing : Minimal assistance;Sitting   Lower Body Dressing: Maximal assistance;Sit to/from stand Lower Body Dressing Details (indicate cue type and reason): Pt able to doff Rt sock, but requires max A to don as he looses his balance posteriorly .  Unable to doff Lt sock Toilet Transfer:  Moderate assistance;+2 for safety/equipment;Stand-pivot;BSC;RW Toilet Transfer Details (indicate cue type and reason): pt with intermittent LOB requiring mod A. Toileting- Clothing Manipulation and Hygiene: Maximal assistance;Sit to/from stand       Functional mobility during ADLs: Moderate assistance;+2 for safety/equipment;Rolling  walker       Vision Patient Visual Report: No change from baseline       Perception     Praxis      Pertinent Vitals/Pain Pain Assessment: No/denies pain     Hand Dominance Right   Extremity/Trunk Assessment     Lower Extremity Assessment RLE Deficits / Details: right leg seems mildly weaker at the hip compared to L leg with seated MMT 3+/5 LLE Deficits / Details: seated MMT 4/5 throughout.   Cervical / Trunk Assessment Cervical / Trunk Assessment: Normal   Communication Communication Communication: Expressive difficulties (at times low volume, slurred, adn difficult to understand)   Cognition Arousal/Alertness: Awake/alert Behavior During Therapy: WFL for tasks assessed/performed;Flat affect Overall Cognitive Status: No family/caregiver present to determine baseline cognitive functioning Area of Impairment: Orientation;Attention;Memory;Following commands;Safety/judgement;Awareness;Problem solving                 Orientation Level: Disoriented to;Time;Situation Current Attention Level: Sustained     Safety/Judgement: Decreased awareness of safety;Decreased awareness of deficits Awareness: Intellectual Problem Solving: Slow processing;Decreased initiation;Difficulty sequencing;Requires verbal cues;Requires tactile cues General Comments: Pt is slow to initiate movement.  he is jo   General Comments  Sp02 95% on RA, HR 84.  BP 143/98 at rest seated EOB; 135/79 standing (pt with periods of zoning out unsure if psych based or orthostasis; 165/68 with prolonged standing    Exercises     Shoulder Instructions      Home Living Family/patient expects to be discharged to:: Private residence Living Arrangements: Parent Available Help at Discharge: Family;Available 24 hours/day Type of Home: Apartment Home Access: Level entry     Home Layout: One level     Bathroom Shower/Tub: Chief Strategy Officer: Handicapped height     Home Equipment:  None          Prior Functioning/Environment Level of Independence: Needs assistance  Gait / Transfers Assistance Needed: Pt walked without AD PTA, goes to a day program every morning for 4 hours (they pick him up) ADL's / Homemaking Assistance Needed: Per mom, he normally bathes and dresses himself.            OT Problem List: Decreased strength;Decreased activity tolerance;Impaired balance (sitting and/or standing);Decreased cognition;Decreased safety awareness;Decreased knowledge of use of DME or AE;Obesity      OT Treatment/Interventions: Self-care/ADL training;DME and/or AE instruction;Therapeutic activities;Cognitive remediation/compensation;Patient/family education;Balance training;Therapeutic exercise    OT Goals(Current goals can be found in the care plan section) Acute Rehab OT Goals Patient Stated Goal: none stated, mom wants him stronger OT Goal Formulation: With patient Time For Goal Achievement: 08/03/20 Potential to Achieve Goals: Good  OT Frequency: Min 2X/week   Barriers to D/C:            Co-evaluation              AM-PAC OT "6 Clicks" Daily Activity     Outcome Measure Help from another person eating meals?: None Help from another person taking care of personal grooming?: A Little Help from another person toileting, which includes using toliet, bedpan, or urinal?: A Lot Help from another person bathing (including washing, rinsing, drying)?: A Lot Help from another person to put on and taking off regular upper  body clothing?: A Little Help from another person to put on and taking off regular lower body clothing?: A Lot 6 Click Score: 16   End of Session Equipment Utilized During Treatment: Gait belt;Rolling walker Nurse Communication: Mobility status  Activity Tolerance: Patient tolerated treatment well Patient left: in chair;with call bell/phone within reach;with chair alarm set (Music playing (R&B))  OT Visit Diagnosis:  (pt for discharge  today)                Time: 0160-1093 OT Time Calculation (min): 48 min Charges:  OT General Charges $OT Visit: 1 Visit OT Evaluation $OT Eval Moderate Complexity: 1 Mod OT Treatments $Self Care/Home Management : 8-22 mins  Eber Jones., OTR/L Acute Rehabilitation Services Pager [phone] Office [phone]   Jeani Hawking M 07/20/2020, 11:57 AM

## 2020-07-20 NOTE — Plan of Care (Signed)

## 2020-07-20 NOTE — Care Management (Signed)
[facility] called equipment at home per mom

## 2020-07-20 NOTE — Discharge Summary (Signed)
Physician Discharge Summary  Brandon [name] WUJ:[id number] DOB: [birth year] DOA: 07/16/2020  PCP: Maudie Flakes, FNP  Admit date: 07/16/2020 Discharge date: 07/20/2020  Admitted From: Home Disposition:  Home  Recommendations for Outpatient Follow-up:  1. Follow up with PCP in 1-2 weeks 2. Please obtain BMP/CBC in one week 3. Please follow up with neurology as scheduled  Home Health:Yes  Equipment/Devices:Wheelchair  Discharge Condition:Stable  CODE STATUS:Full  Diet recommendation:  Diabetic diet  Brief/Interim Summary: 30 year old man PMH multiple sclerosis on infusion of medication every 8 months last in October, pulmonary embolism presented after a fall at home after which she could not get up secondary to generalized weakness.  Initial concern for MS flare per family.  4 days prior was first anniversary of his father's death.  Admitted for generalized weakness in the context of COVID-19 infection.  Patient admitted as above with what appears to be acute generalized weakness and fatigue.  Patient noted to be COVID-positive at intake.  Patient was without hypoxia, his main symptoms were considered to be fatigue and weakness, given his known history of multiple sclerosis patient was assumed to have MS flare, neurology was consulted, imaging unremarkable and other work-up essentially negative for any focal deficits.  Unclear if patient's weakness was in the setting of acute viral illness or subacute MS.  Patient remains poorly interactive, weak but able to ambulate with physical therapy, as such patient will be discharged home on steroids with close follow-up by neurology as scheduled.  Patient will also be discharged home with home health and physical therapy as his mother, who the patient lives with, is wheelchair-bound and unable to the assist the patient in any meaningful way.  At this time patient has been educated on proper quarantining and is otherwise stable and agreeable for  discharge home.   Discharge Diagnoses:  Principal Problem:   Generalized weakness Active Problems:   HTN (hypertension)   Multiple sclerosis (HCC)   Disorganized schizophrenia (HCC)   Unspecified intellectual disabilities   COVID-19 virus infection   COVID-19    Discharge Instructions  Discharge Instructions    Call MD for:  difficulty breathing, headache or visual disturbances   Complete by: As directed    Diet - low sodium heart healthy   Complete by: As directed    Discharge instructions   Complete by: As directed    ?   Person Under Monitoring Name: Brandon [name]  Location: [address] [place] [address]   Infection Prevention Recommendations for Individuals Confirmed to have, or Being Evaluated for, 2019 Novel Coronavirus (COVID-19) Infection Who Receive Care at Home  Individuals who are confirmed to have, or are being evaluated for, COVID-19 should follow the prevention steps below until a healthcare provider or local or state health department says they can return to normal activities.  Stay home except to get medical care You should restrict activities outside your home, except for getting medical care. Do not go to work, school, or public areas, and do not use public transportation or taxis.  Call ahead before visiting your doctor Before your medical appointment, call the healthcare provider and tell them that you have, or are being evaluated for, COVID-19 infection. This will help the healthcare provider's office take steps to keep other people from getting infected. Ask your healthcare provider to call the local or state health department.  Monitor your symptoms Seek prompt medical attention if your illness is worsening (e.g., difficulty breathing). Before going to your  medical appointment, call the healthcare provider and tell them that you have, or are being evaluated for, COVID-19 infection. Ask your healthcare provider to call the  local or state health department.  Wear a facemask You should wear a facemask that covers your nose and mouth when you are in the same room with other people and when you visit a healthcare provider. People who live with or visit you should also wear a facemask while they are in the same room with you.  Separate yourself from other people in your home As much as possible, you should stay in a different room from other people in your home. Also, you should use a separate bathroom, if available.  Avoid sharing household items You should not share dishes, drinking glasses, cups, eating utensils, towels, bedding, or other items with other people in your home. After using these items, you should wash them thoroughly with soap and water.  Cover your coughs and sneezes Cover your mouth and nose with a tissue when you cough or sneeze, or you can cough or sneeze into your sleeve. Throw used tissues in a lined trash can, and immediately wash your hands with soap and water for at least 20 seconds or use an alcohol-based hand rub.  Wash your [employer] your hands often and thoroughly with soap and water for at least 20 seconds. You can use an alcohol-based hand sanitizer if soap and water are not available and if your hands are not visibly dirty. Avoid touching your eyes, nose, and mouth with unwashed hands.   Prevention Steps for Caregivers and Household Members of Individuals Confirmed to have, or Being Evaluated for, COVID-19 Infection Being Cared for in the Home  If you live with, or provide care at home for, a person confirmed to have, or being evaluated for, COVID-19 infection please follow these guidelines to prevent infection:  Follow healthcare provider's instructions Make sure that you understand and can help the patient follow any healthcare provider instructions for all care.  Provide for the patient's basic needs You should help the patient with basic needs in the home and  provide support for getting groceries, prescriptions, and other personal needs.  Monitor the patient's symptoms If they are getting sicker, call his or her medical provider and tell them that the patient has, or is being evaluated for, COVID-19 infection. This will help the healthcare provider's office take steps to keep other people from getting infected. Ask the healthcare provider to call the local or state health department.  Limit the number of people who have contact with the patient If possible, have only one caregiver for the patient. Other household members should stay in another home or place of residence. If this is not possible, they should stay in another room, or be separated from the patient as much as possible. Use a separate bathroom, if available. Restrict visitors who do not have an essential need to be in the home.  Keep older adults, very young children, and other sick people away from the patient Keep older adults, very young children, and those who have compromised immune systems or chronic health conditions away from the patient. This includes people with chronic heart, lung, or kidney conditions, diabetes, and cancer.  Ensure good ventilation Make sure that shared spaces in the home have good air flow, such as from an air conditioner or an opened window, weather permitting.  Wash your hands often Wash your hands often and thoroughly with soap and water for at  least 20 seconds. You can use an alcohol based hand sanitizer if soap and water are not available and if your hands are not visibly dirty. Avoid touching your eyes, nose, and mouth with unwashed hands. Use disposable paper towels to dry your hands. If not available, use dedicated cloth towels and replace them when they become wet.  Wear a facemask and gloves Wear a disposable facemask at all times in the room and gloves when you touch or have contact with the patient's blood, body fluids, and/or secretions or  excretions, such as sweat, saliva, sputum, nasal mucus, vomit, urine, or feces.  Ensure the mask fits over your nose and mouth tightly, and do not touch it during use. Throw out disposable facemasks and gloves after using them. Do not reuse. Wash your hands immediately after removing your facemask and gloves. If your personal clothing becomes contaminated, carefully remove clothing and launder. Wash your hands after handling contaminated clothing. Place all used disposable facemasks, gloves, and other waste in a lined container before disposing them with other household waste. Remove gloves and wash your hands immediately after handling these items.  Do not share dishes, glasses, or other household items with the patient Avoid sharing household items. You should not share dishes, drinking glasses, cups, eating utensils, towels, bedding, or other items with a patient who is confirmed to have, or being evaluated for, COVID-19 infection. After the person uses these items, you should wash them thoroughly with soap and water.  Wash laundry thoroughly Immediately remove and wash clothes or bedding that have blood, body fluids, and/or secretions or excretions, such as sweat, saliva, sputum, nasal mucus, vomit, urine, or feces, on them. Wear gloves when handling laundry from the patient. Read and follow directions on labels of laundry or clothing items and detergent. In general, wash and dry with the warmest temperatures recommended on the label.  Clean all areas the individual has used often Clean all touchable surfaces, such as counters, tabletops, doorknobs, bathroom fixtures, toilets, phones, keyboards, tablets, and bedside tables, every day. Also, clean any surfaces that may have blood, body fluids, and/or secretions or excretions on them. Wear gloves when cleaning surfaces the patient has come in contact with. Use a diluted bleach solution (e.g., dilute bleach with 1 part bleach and 10 parts water)  or a household disinfectant with a label that says EPA-registered for coronaviruses. To make a bleach solution at home, add 1 tablespoon of bleach to 1 quart (4 cups) of water. For a larger supply, add  cup of bleach to 1 gallon (16 cups) of water. Read labels of cleaning products and follow recommendations provided on product labels. Labels contain instructions for safe and effective use of the cleaning product including precautions you should take when applying the product, such as wearing gloves or eye protection and making sure you have good ventilation during use of the product. Remove gloves and wash hands immediately after cleaning.  Monitor yourself for signs and symptoms of illness Caregivers and household members are considered close contacts, should monitor their health, and will be asked to limit movement outside of the home to the extent possible. Follow the monitoring steps for close contacts listed on the symptom monitoring form.   ? If you have additional questions, contact your local health department or call the epidemiologist on call at [phone] (available 24/7). ? This guidance is subject to change. For the most up-to-date guidance from [facility], please refer to their website: [email or web address]hu   Increase activity slowly  Complete by: As directed      Allergies as of 07/20/2020   No Known Allergies     Medication List    TAKE these medications   acetaminophen 500 MG tablet Commonly known as: TYLENOL Take 1 tablet (500 mg total) by mouth every 6 (six) hours as needed. What changed: reasons to take this   amLODipine 10 MG tablet Commonly known as: NORVASC Take 10 mg by mouth daily.   Asenapine Maleate 10 MG Subl Place 1 tablet (10 mg total) under the tongue in the morning, at noon, and at bedtime.   benztropine 1 MG tablet Commonly known as: COGENTIN Take 1 tablet (1 mg total) by mouth 2 (two) times  daily.   docusate sodium 100 MG capsule Commonly known as: COLACE Take 400 mg by mouth at bedtime.   Eliquis 5 MG Tabs tablet Generic drug: apixaban Take 5 mg by mouth 2 (two) times daily.   haloperidol 10 MG tablet Commonly known as: HALDOL Take 1 tablet (10 mg total) by mouth 2 (two) times daily.   haloperidol decanoate 100 MG/ML injection Commonly known as: HALDOL DECANOATE Inject 2 mLs (200 mg total) into the muscle every 30 (thirty) days.   latanoprost 0.005 % ophthalmic solution Commonly known as: XALATAN Place 1 drop into both eyes at bedtime.   Melatonin 10 MG Caps TAKE ONE TABLET BY MOUTH AT BEDTIME What changed: how much to take   metFORMIN 500 MG 24 hr tablet Commonly known as: GLUCOPHAGE-XR Take 500 mg by mouth daily with breakfast.   multivitamin with minerals Tabs tablet Take 1 tablet by mouth daily.   ocrelizumab in sodium chloride 0.9 % 500 mL CHANGE ::  Ocrevus 600mg  IV EVERY 8 (EIGHT) months.   predniSONE 10 MG tablet Commonly known as: DELTASONE Take 4 tablets (40 mg total) by mouth daily for 3 days, THEN 3 tablets (30 mg total) daily for 3 days, THEN 2 tablets (20 mg total) daily for 3 days, THEN 1 tablet (10 mg total) daily for 3 days. Start taking on: July 20, 2020            Durable Medical Equipment  (From admission, onward)         Start     Ordered   07/19/20 1545  For home use only DME high strength lightweight manual wheelchair with seat cushion  Once       Comments: Patient suffers from MS, weakness which impairs their ability to perform daily activities like ADLs in the home.  A Walker will not resolve  issue with performing activities of daily living. A wheelchair will allow patient to safely perform daily activities.Length of need lfetime due to progressive chronic disease, MS Accessories: elevating leg rests (ELRs), wheel locks, extensions and anti-tippers.   07/19/20 1546   07/19/20 1459  For home use only DME 3 n 1  Once        Comments: N eeds Wide [facility]   07/19/20 1459   07/19/20 1458  For home use only DME Walker wide  Once       Question:  Patient needs a walker to treat with the following condition  Answer:  Weakness   07/19/20 1459          No Known Allergies  Consultations:  Neurology   Procedures/Studies: CT Head Wo Contrast  Result Date: 07/16/2020 CLINICAL DATA:  Altered mental status and neck injury. EXAM: CT HEAD WITHOUT CONTRAST CT CERVICAL SPINE WITHOUT CONTRAST TECHNIQUE: Multidetector CT  imaging of the head and cervical spine was performed following the standard protocol without intravenous contrast. Multiplanar CT image reconstructions of the cervical spine were also generated. COMPARISON:  January 25, 2018.  September 22, 2017. FINDINGS: CT HEAD FINDINGS Brain: Stable periventricular white matter low densities are noted consistent with history of multiple sclerosis. No mass effect or midline shift is noted. Ventricular size is within normal limits. There is no evidence of mass lesion, hemorrhage or acute infarction. Vascular: No hyperdense vessel or unexpected calcification. Skull: Normal. Negative for fracture or focal lesion. Sinuses/Orbits: No acute finding. Other: None. CT CERVICAL SPINE FINDINGS Alignment: Normal. Skull base and vertebrae: No acute fracture. No primary bone lesion or focal pathologic process. Soft tissues and spinal canal: No prevertebral fluid or swelling. No visible canal hematoma. Disc levels:  Normal. Upper chest: Negative. Other: None. IMPRESSION: 1. Stable periventricular white matter low densities are noted consistent with history of multiple sclerosis. No acute intracranial abnormality seen. 2. Normal cervical spine. Electronically Signed   By: Lupita Raider M.D.   On: 07/16/2020 10:13   CT Cervical Spine Wo Contrast  Result Date: 07/16/2020 CLINICAL DATA:  Altered mental status and neck injury. EXAM: CT HEAD WITHOUT CONTRAST CT CERVICAL SPINE WITHOUT CONTRAST TECHNIQUE:  Multidetector CT imaging of the head and cervical spine was performed following the standard protocol without intravenous contrast. Multiplanar CT image reconstructions of the cervical spine were also generated. COMPARISON:  January 25, 2018.  September 22, 2017. FINDINGS: CT HEAD FINDINGS Brain: Stable periventricular white matter low densities are noted consistent with history of multiple sclerosis. No mass effect or midline shift is noted. Ventricular size is within normal limits. There is no evidence of mass lesion, hemorrhage or acute infarction. Vascular: No hyperdense vessel or unexpected calcification. Skull: Normal. Negative for fracture or focal lesion. Sinuses/Orbits: No acute finding. Other: None. CT CERVICAL SPINE FINDINGS Alignment: Normal. Skull base and vertebrae: No acute fracture. No primary bone lesion or focal pathologic process. Soft tissues and spinal canal: No prevertebral fluid or swelling. No visible canal hematoma. Disc levels:  Normal. Upper chest: Negative. Other: None. IMPRESSION: 1. Stable periventricular white matter low densities are noted consistent with history of multiple sclerosis. No acute intracranial abnormality seen. 2. Normal cervical spine. Electronically Signed   By: Lupita Raider M.D.   On: 07/16/2020 10:13   MR Brain W and Wo Contrast  Result Date: 07/17/2020 CLINICAL DATA:  30 year old male with history of multiple sclerosis. Altered mental status. Positive COVID-19. EXAM: MRI HEAD WITHOUT AND WITH CONTRAST TECHNIQUE: Multiplanar, multiecho pulse sequences of the brain and surrounding structures were obtained without and with intravenous contrast. CONTRAST:  10mL GADAVIST GADOBUTROL 1 MMOL/ML IV SOLN COMPARISON:  Truncated brain MRI 06/06/2019. Brain MRI without and with contrast 01/26/2018 and earlier. FINDINGS: Brain: No restricted diffusion to suggest acute infarction. No midline shift, mass effect, evidence of mass lesion, extra-axial collection or acute intracranial  hemorrhage. Cervicomedullary junction and pituitary are within normal limits. Extensive, confluent chronic cerebral white matter T2 and FLAIR hyperintensity and associated white matter volume loss with mild ex vacuo enlargement of the ventricles, more pronounced in the right hemisphere. Severe chronic volume loss of the corpus callosum. The cortex appears generally spared as before. Chronic involvement of the bilateral deep white matter capsules, more apparent on the left (series 10, image 14). Pronounced involvement of the posterior right cerebral peduncle is more conspicuous on FLAIR since 2019 (series 11, image 11). Less discrete involvement of the  brainstem elsewhere appears stable. And comparatively mild involvement of the central left cerebellum is stable. No enhancing or diffusion restricted lesions identified. No chronic cerebral blood products. No definite dural thickening or abnormal enhancement otherwise. Vascular: Major intracranial vascular flow voids are stable since 2019. The major dural venous sinuses are enhancing and appear to be patent. Skull and upper cervical spine: Dedicated cervical spine imaging reported separately today. Visualized bone marrow signal is within normal limits. Sinuses/Orbits: Grossly stable orbits. Paranasal sinuses and mastoids are stable and well pneumatized. Other: Visible internal auditory structures appear normal. Scalp and face soft tissues appear negative. IMPRESSION: 1. Very severe chronic demyelinating disease with pronounced white matter volume loss appears only mildly progressed since a 2019 MRI (right cerebral peduncle). No active demyelination identified. 2. No new intracranial abnormality identified. 3. Cervical spine reported separately today. Electronically Signed   By: Odessa Fleming M.D.   On: 07/17/2020 04:32   MR Cervical Spine W or Wo Contrast  Result Date: 07/17/2020 CLINICAL DATA:  30 year old male with history of multiple sclerosis. Altered mental status.  Positive COVID-19. EXAM: MRI CERVICAL SPINE WITHOUT AND WITH CONTRAST TECHNIQUE: Multiplanar and multiecho pulse sequences of the cervical spine, to include the craniocervical junction and cervicothoracic junction, were obtained without and with intravenous contrast. CONTRAST:  10mL GADAVIST GADOBUTROL 1 MMOL/ML IV SOLN in conjunction with contrast enhanced imaging of the brain reported separately. COMPARISON:  Brain MRI today reported separately. Most recent cervical spine MRI is 01/18/2014. Cervical spine CT 07/16/2020 and earlier. FINDINGS: Alignment: Chronic straightening of cervical lordosis. No spondylolisthesis. Vertebrae: No marrow edema or evidence of acute osseous abnormality. Visualized bone marrow signal is within normal limits. Cord: Confluent roughly 3 cm long segment of abnormal spinal cord STIR and mild T2 hyperintensity at the C4 and C5 vertebral levels (series 11, image 8) was apparent in 2015 although appears mildly larger since that time. Axial images suggest fairly holo-cord involvement (series 13, image 17). No associated cord expansion. No associated enhancement. Elsewhere fairly subtle cervical spinal cord heterogeneity, such as in the left posterior hemi cord at the C1-C2 level on series 12, image 3 which appears larger since 2015. No cervical cord enhancement.  No dural thickening or enhancement. Posterior Fossa, vertebral arteries, paraspinal tissues: Cervicomedullary junction appears to remain within normal limits. Brain reported separately today. Preserved major vascular flow voids in the neck. Partially retropharyngeal course of the right carotid. Otherwise negative visible neck soft tissues. Disc levels: No significant cervical spine degeneration. No spinal or neural foraminal stenosis. IMPRESSION: 1. Chronic cervical spinal cord demyelination, with a dominant C4 and C5 level cord lesion. Mild progression since a 2015 MRI with no active demyelination identified. 2. No superimposed  cervical spine degeneration. Electronically Signed   By: Odessa Fleming M.D.   On: 07/17/2020 04:39   MR THORACIC SPINE W WO CONTRAST  Result Date: 07/17/2020 CLINICAL DATA:  30 year old male with history of multiple sclerosis. Altered mental status. Positive COVID-19. EXAM: MRI THORACIC WITHOUT AND WITH CONTRAST TECHNIQUE: Multiplanar and multiecho pulse sequences of the thoracic spine were obtained without and with intravenous contrast. CONTRAST:  51mL GADAVIST GADOBUTROL 1 MMOL/ML IV SOLN in conjunction with contrast enhanced imaging of the brain and cervical spine reported separately. COMPARISON:  Brain and cervical spine MRI today reported separately. No prior thoracic MRI. FINDINGS: Limited cervical spine imaging:  Reported separately today. Thoracic spine segmentation:  Appears to be normal. Alignment: Preserved thoracic kyphosis. No significant scoliosis. No spondylolisthesis. Vertebrae: No marrow edema or  evidence of acute osseous abnormality. Visualized bone marrow signal is within normal limits. Cord: Spinal cord detail on sagittal images limited by large body habitus. There is no convincing thoracic cord volume loss. The conus medullaris appears grossly normal at T12-L1. No definite thoracic cord demyelination. No abnormal intradural enhancement or dural thickening; axial postcontrast images are degraded by motion. Paraspinal and other soft tissues: Small or trace bilateral layering pleural effusions (series 23, image 15). Otherwise grossly negative lungs, mediastinum. Negative visible abdominal viscera. Thoracic paraspinal soft tissues are within normal limits. Disc levels: No significant degeneration T1-T2 through T5-T6. T6-T7: Mild facet hypertrophy without stenosis. T7-T8: Disc space loss with small central and cephalad disc extrusion best seen on series 19, image 10. Superimposed mild epidural lipomatosis. Overall mild spinal stenosis with mild spinal cord mass effect. No associated cord signal  abnormality here. No foraminal involvement or stenosis. Degenerative facet hypertrophy T8-T9 through T11-T12 with up to mild associated bilateral neural foraminal stenosis. T12-L1 is negative. IMPRESSION: 1. No thoracic spinal cord demyelination is identified. 2. Isolated thoracic disc degeneration at T7-T8 where a cephalad disc extrusion results in mild degenerative spinal stenosis and mild spinal cord mass effect in conjunction with epidural lipomatosis. No associated cord signal abnormality. 3. Lower thoracic facet degenerative hypertrophy with up to mild lower thoracic neural foraminal stenosis. 4. Trace layering pleural effusions. Electronically Signed   By: Odessa Fleming M.D.   On: 07/17/2020 04:48   DG Chest Port 1 View  Result Date: 07/16/2020 CLINICAL DATA:  30 year old male with sepsis. EXAM: PORTABLE CHEST 1 VIEW COMPARISON:  Chest radiograph dated 12/04/2017 FINDINGS: There is shallow inspiration. Minimal bibasilar atelectasis. No focal consolidation, pleural effusion, pneumothorax. The cardiac silhouette is within limits. No acute osseous pathology. IMPRESSION: Shallow inspiration with minimal bibasilar atelectasis. Electronically Signed   By: Elgie Collard M.D.   On: 07/16/2020 18:58      Subjective: No acute issues or events overnight still complaining of fatigue and weakness generally but without focal deficits.  Denies nausea vomiting diarrhea constipation headache fevers or chills.   Discharge Exam: Vitals:   07/19/20 2031 07/20/20 0400  BP: 129/80 (!) 143/98  Pulse: 86 75  Resp: 17 20  Temp: 98 F (36.7 C) 97.9 F (36.6 C)  SpO2: 95% 92%   Vitals:   07/19/20 1506 07/19/20 1510 07/19/20 2031 07/20/20 0400  BP:  (!) 164/95 129/80 (!) 143/98  Pulse: 60 97 86 75  Resp:  18 17 20   Temp:  98.8 F (37.1 C) 98 F (36.7 C) 97.9 F (36.6 C)  TempSrc:  Oral Oral Axillary  SpO2: 99% (!) 79% 95% 92%  Weight:      Height:        General: Pt is alert, awake, not in acute  distress Cardiovascular: RRR, S1/S2 +, no rubs, no gallops Respiratory: CTA bilaterally, no wheezing, no rhonchi Abdominal: Soft, NT, ND, bowel sounds + Extremities: no edema, no cyanosis    The results of significant diagnostics from this hospitalization (including imaging, microbiology, ancillary and laboratory) are listed below for reference.     Microbiology: Recent Results (from the past 240 hour(s))  Resp Panel by RT-PCR (Flu A&B, Covid) Nasopharyngeal Swab     Status: Abnormal   Collection Time: 07/16/20  7:10 PM   Specimen: Nasopharyngeal Swab; Nasopharyngeal(NP) swabs in vial transport medium  Result Value Ref Range Status   SARS Coronavirus 2 by RT PCR POSITIVE (A) NEGATIVE Final    Comment: RESULT CALLED TO, READ BACK  BY AND VERIFIED WITH: RNCHARLIE PATEL AT 2210 BY MESSAN ON 07/16/2020 (NOTE) SARS-CoV-2 target nucleic acids are DETECTED.  The SARS-CoV-2 RNA is generally detectable in upper respiratory specimens during the acute phase of infection. Positive results are indicative of the presence of the identified virus, but do not rule out bacterial infection or co-infection with other pathogens not detected by the test. Clinical correlation with patient history and other diagnostic information is necessary to determine patient infection status. The expected result is Negative.  Fact Sheet for Patients: BloggerCourse.com  Fact Sheet for Healthcare Providers: SeriousBroker.it  This test is not yet approved or cleared by the [place] FDA and  has been authorized for detection and/or diagnosis of SARS-CoV-2 by FDA under an Emergency Use Authorization (EUA).  This EUA will remain in effect (meaning thi s test can be used) for the duration of  the COVID-19 declaration under Section 564(b)(1) of the Act, 21 U.S.C. section 360bbb-3(b)(1), unless the authorization is terminated or revoked sooner.     Influenza A by  PCR NEGATIVE NEGATIVE Final   Influenza B by PCR NEGATIVE NEGATIVE Final    Comment: (NOTE) The Xpert Xpress SARS-CoV-2/FLU/RSV plus assay is intended as an aid in the diagnosis of influenza from Nasopharyngeal swab specimens and should not be used as a sole basis for treatment. Nasal washings and aspirates are unacceptable for Xpert Xpress SARS-CoV-2/FLU/RSV testing.  Fact Sheet for Patients: BloggerCourse.com  Fact Sheet for Healthcare Providers: SeriousBroker.it  This test is not yet approved or cleared by the [place] FDA and has been authorized for detection and/or diagnosis of SARS-CoV-2 by FDA under an Emergency Use Authorization (EUA). This EUA will remain in effect (meaning this test can be used) for the duration of the COVID-19 declaration under Section 564(b)(1) of the Act, 21 U.S.C. section 360bbb-3(b)(1), unless the authorization is terminated or revoked.  Performed at [facility], [address] [place], [place] [address]   Blood Culture (routine x 2)     Status: None (Preliminary result)   Collection Time: 07/16/20  7:14 PM   Specimen: BLOOD  Result Value Ref Range Status   Specimen Description BLOOD SITE NOT SPECIFIED  Final   Special Requests   Final    BOTTLES DRAWN AEROBIC AND ANAEROBIC Blood Culture results may not be optimal due to an inadequate volume of blood received in culture bottles   Culture   Final    NO GROWTH 4 DAYS Performed at [facility], [address] [place], [place] [address]    Report Status PENDING  Incomplete  Blood Culture (routine x 2)     Status: None (Preliminary result)   Collection Time: 07/16/20  7:15 PM   Specimen: BLOOD  Result Value Ref Range Status   Specimen Description BLOOD SITE NOT SPECIFIED  Final   Special Requests   Final    BOTTLES DRAWN AEROBIC AND ANAEROBIC Blood Culture results may not be optimal due to an inadequate volume of blood received  in culture bottles   Culture   Final    NO GROWTH 4 DAYS Performed at [facility], [address] [place], [place] [address]    Report Status PENDING  Incomplete  Urine culture     Status: None   Collection Time: 07/16/20 11:08 PM   Specimen: In/Out Cath Urine  Result Value Ref Range Status   Specimen Description IN/OUT CATH URINE  Final   Special Requests NONE  Final  Culture   Final    NO GROWTH Performed at [facility], [address] [place], [place] [address]    Report Status 07/18/2020 FINAL  Final     Labs: BNP (last 3 results) No results for input(s): BNP in the last 8760 hours. Basic Metabolic Panel: Recent Labs  Lab 07/16/20 1916 07/17/20 0819 07/18/20 0302 07/19/20 0159 07/20/20 0139  NA 140 139 138 137 135  K 3.9 3.8 3.6 3.6 4.4  CL 104 103 105 103 102  CO2 23 23 21* 23 20*  GLUCOSE 89 79 90 144* 184*  BUN 12 14 9 12 19   CREATININE 0.97 1.08 0.93 1.07 0.89  CALCIUM 9.9 9.4 9.0 9.0 8.8*   Liver Function Tests: Recent Labs  Lab 07/16/20 1916 07/17/20 0819 07/18/20 0302 07/19/20 0159 07/20/20 0139  AST 117* 168* 121* 77* 66*  ALT 71* 90* 87* 79* 73*  ALKPHOS 97 92 85 81 82  BILITOT 0.6 0.4 0.6 0.4 0.6  PROT 7.0 6.9 6.4* 6.6 6.7  ALBUMIN 4.2 4.1 3.6 3.6 3.5   No results for input(s): LIPASE, AMYLASE in the last 168 hours. No results for input(s): AMMONIA in the last 168 hours. CBC: Recent Labs  Lab 07/16/20 1916 07/17/20 0819 07/18/20 0302 07/19/20 0159 07/20/20 0139  WBC 7.3 4.6 7.3 8.8 17.1*  NEUTROABS 5.9 2.7 5.3 7.6 15.9*  HGB 14.3 14.4 14.3 14.2 13.9  HCT 44.4 47.8 47.2 42.9 45.4  MCV 81.2 82.4 84.6 79.6* 82.4  PLT 197 184 187 187 189   Cardiac Enzymes: No results for input(s): CKTOTAL, CKMB, CKMBINDEX, TROPONINI in the last 168 hours. BNP: Invalid input(s): POCBNP CBG: Recent Labs  Lab 07/17/20 0725 07/17/20 0801 07/17/20 0922  GLUCAP 79 76 95   D-Dimer Recent Labs    07/19/20 0159 07/20/20 0139   DDIMER <0.27 0.30   Hgb A1c No results for input(s): HGBA1C in the last 72 hours. Lipid Profile No results for input(s): CHOL, HDL, LDLCALC, TRIG, CHOLHDL, LDLDIRECT in the last 72 hours. Thyroid function studies Recent Labs    07/18/20 0302  TSH 2.636   Anemia work up No results for input(s): VITAMINB12, FOLATE, FERRITIN, TIBC, IRON, RETICCTPCT in the last 72 hours. Urinalysis    Component Value Date/Time   COLORURINE AMBER (A) 07/16/2020 2322   APPEARANCEUR CLEAR 07/16/2020 2322   LABSPEC 1.030 07/16/2020 2322   PHURINE 5.0 07/16/2020 2322   GLUCOSEU NEGATIVE 07/16/2020 2322   HGBUR NEGATIVE 07/16/2020 2322   HGBUR negative 01/14/2010 1451   BILIRUBINUR NEGATIVE 07/16/2020 2322   KETONESUR 5 (A) 07/16/2020 2322   PROTEINUR 30 (A) 07/16/2020 2322   UROBILINOGEN 1.0 03/13/2015 0223   NITRITE NEGATIVE 07/16/2020 2322   LEUKOCYTESUR NEGATIVE 07/16/2020 2322   Sepsis Labs Invalid input(s): PROCALCITONIN,  WBC,  LACTICIDVEN Microbiology Recent Results (from the past 240 hour(s))  Resp Panel by RT-PCR (Flu A&B, Covid) Nasopharyngeal Swab     Status: Abnormal   Collection Time: 07/16/20  7:10 PM   Specimen: Nasopharyngeal Swab; Nasopharyngeal(NP) swabs in vial transport medium  Result Value Ref Range Status   SARS Coronavirus 2 by RT PCR POSITIVE (A) NEGATIVE Final    Comment: RESULT CALLED TO, READ BACK BY AND VERIFIED WITH: RNCHARLIE PATEL AT 2210 BY MESSAN ON 07/16/2020 (NOTE) SARS-CoV-2 target nucleic acids are DETECTED.  The SARS-CoV-2 RNA is generally detectable in upper respiratory specimens during the acute phase of infection. Positive results are indicative of the presence of the identified virus, but do  not rule out bacterial infection or co-infection with other pathogens not detected by the test. Clinical correlation with patient history and other diagnostic information is necessary to determine patient infection status. The expected result is  Negative.  Fact Sheet for Patients: BloggerCourse.comhttps://www.fda.gov/media/152166/download  Fact Sheet for Healthcare Providers: SeriousBroker.ithttps://www.fda.gov/media/152162/download  This test is not yet approved or cleared by the [place] FDA and  has been authorized for detection and/or diagnosis of SARS-CoV-2 by FDA under an Emergency Use Authorization (EUA).  This EUA will remain in effect (meaning thi s test can be used) for the duration of  the COVID-19 declaration under Section 564(b)(1) of the Act, 21 U.S.C. section 360bbb-3(b)(1), unless the authorization is terminated or revoked sooner.     Influenza A by PCR NEGATIVE NEGATIVE Final   Influenza B by PCR NEGATIVE NEGATIVE Final    Comment: (NOTE) The Xpert Xpress SARS-CoV-2/FLU/RSV plus assay is intended as an aid in the diagnosis of influenza from Nasopharyngeal swab specimens and should not be used as a sole basis for treatment. Nasal washings and aspirates are unacceptable for Xpert Xpress SARS-CoV-2/FLU/RSV testing.  Fact Sheet for Patients: BloggerCourse.comhttps://www.fda.gov/media/152166/download  Fact Sheet for Healthcare Providers: SeriousBroker.ithttps://www.fda.gov/media/152162/download  This test is not yet approved or cleared by the [place] FDA and has been authorized for detection and/or diagnosis of SARS-CoV-2 by FDA under an Emergency Use Authorization (EUA). This EUA will remain in effect (meaning this test can be used) for the duration of the COVID-19 declaration under Section 564(b)(1) of the Act, 21 U.S.C. section 360bbb-3(b)(1), unless the authorization is terminated or revoked.  Performed at [facility], [address] [place], [place] [address]   Blood Culture (routine x 2)     Status: None (Preliminary result)   Collection Time: 07/16/20  7:14 PM   Specimen: BLOOD  Result Value Ref Range Status   Specimen Description BLOOD SITE NOT SPECIFIED  Final   Special Requests   Final    BOTTLES DRAWN AEROBIC AND ANAEROBIC Blood  Culture results may not be optimal due to an inadequate volume of blood received in culture bottles   Culture   Final    NO GROWTH 4 DAYS Performed at [facility], [address] [place], [place] [address]    Report Status PENDING  Incomplete  Blood Culture (routine x 2)     Status: None (Preliminary result)   Collection Time: 07/16/20  7:15 PM   Specimen: BLOOD  Result Value Ref Range Status   Specimen Description BLOOD SITE NOT SPECIFIED  Final   Special Requests   Final    BOTTLES DRAWN AEROBIC AND ANAEROBIC Blood Culture results may not be optimal due to an inadequate volume of blood received in culture bottles   Culture   Final    NO GROWTH 4 DAYS Performed at [facility], [address] [place], [place] [address]    Report Status PENDING  Incomplete  Urine culture     Status: None   Collection Time: 07/16/20 11:08 PM   Specimen: In/Out Cath Urine  Result Value Ref Range Status   Specimen Description IN/OUT CATH URINE  Final   Special Requests NONE  Final   Culture   Final    NO GROWTH Performed at [facility], [address] [place], [place] [address]    Report Status 07/18/2020 FINAL  Final     Time coordinating discharge: Over 30 minutes  SIGNED:   Azucena FallenWilliam C Idabell Picking, DO  [facility] 07/20/2020, 7:28 AM Pager   If 7PM-7AM, please contact night-coverage [email or web address]

## 2020-07-20 NOTE — Progress Notes (Signed)
Physical Therapy Treatment Patient Details Name: Brandon [name] MRN: [id number] DOB: Mar 30, 1991 Today's Date: 07/20/2020    History of Present Illness Pt is a 30 y.o. male admitted on 07/16/20 for fall and weakness.  Found to be COVID 19 + without respiratory compromise.  Significant PMH of MS with ocrelizumab infusions every 8 months, schizophrenia, PE, obesity, HTN, bipolar, ADHD.   PT Comments    Pt progressing with mobility. Pt currently requires modA (+2 safety) to stand and take steps with RW. Pt limited by generalized weakness and impaired balance strategies/postural reactions, requiring assist to prevent multiple LOB with activity. Pt with staring spells/zoning out periodically; difficult to determine if due to orthostasis, or more related to prior psychiatric issues. Pt's mother plans to have him return home with 24/7 assist. See below for DME recommendations; would benefit from [place] transport home. If pt to remain admitted, will continue to follow acutely.   Follow Up Recommendations  Home health PT;Supervision/Assistance - 24 hour     Equipment Recommendations   20x20 wheelchair; bariatric-sized rolling walker; bariatric-sized 3in1    Recommendations for Other Services       Precautions / Restrictions Precautions Precautions: Fall;Other (comment) Precaution Comments: 2 recent falls at home, one outside and one in the bathroom; staring spells Restrictions Weight Bearing Restrictions: No    Mobility  Bed Mobility Overal bed mobility: Needs Assistance Bed Mobility: Supine to Sit     Supine to sit: Mod assist;HOB elevated     General bed mobility comments: Pt requires cues to initiate task and assist to lift trunk.  He was able to scoot hips to EOB with min guard assist; pt with fall back into laying across bed requiring modA for UE support to lift trunk  Transfers Overall transfer level: Needs assistance Equipment used: Rolling walker (2 wheeled) Transfers: Sit  to/from Stand Sit to Stand: Mod assist;+2 safety/equipment;+2 physical assistance Stand pivot transfers: Mod assist;+2 safety/equipment       General transfer comment: assist to power up into standing, and assist for balance and safety as pt tends to zone out and leans posteriorly and to the left; multiple sit<>stands from EOB and recliner to RW; varying assist levels; poor eccentric control  Ambulation/Gait Ambulation/Gait assistance: Mod assist;+2 safety/equipment Gait Distance (Feet): 2 Feet Assistive device: Rolling walker (2 wheeled) Gait Pattern/deviations: Step-to pattern;Wide base of support;Trunk flexed;Leaning posteriorly Gait velocity: Decreased   General Gait Details: Performed various bouts of pre-gait marching, and steps to recliner with RW and modA for stability; pt with inconsistent posterior lean with LOB back to sitting requiring assist for safe lowering to bed/chair   Stairs             Wheelchair Mobility    Modified Rankin (Stroke Patients Only)       Balance Overall balance assessment: Needs assistance Sitting-balance support: Feet supported;Bilateral upper extremity supported Sitting balance-Leahy Scale: Fair Sitting balance - Comments: pt able to maintain static sitting with min guard assist; LOB posteriorly when attempting to cross leg to don sock   Standing balance support: Bilateral upper extremity supported Standing balance-Leahy Scale: Poor Standing balance comment: Reliant on UE support and external assist                            Cognition Arousal/Alertness: Awake/alert Behavior During Therapy: WFL for tasks assessed/performed;Flat affect Overall Cognitive Status: No family/caregiver present to determine baseline cognitive functioning Area of Impairment: Orientation;Attention;Following commands;Safety/judgement;Awareness;Problem solving  Orientation Level: Disoriented to;Time;Situation Current  Attention Level: Sustained   Following Commands: Follows one step commands inconsistently;Follows one step commands with increased time Safety/Judgement: Decreased awareness of safety;Decreased awareness of deficits Awareness: Intellectual Problem Solving: Slow processing;Decreased initiation;Difficulty sequencing;Requires verbal cues;Requires tactile cues General Comments: Pt is slow to initiate movement. Will joke and laugh appropriately; also with staring spells and some laughing out of context      Exercises      General Comments General comments (skin integrity, edema, etc.): Sp02 95% on RA, HR 84. BP 143/98 at rest, BP 135/79 standing (pt with periods of zoning out unsure if psych based or orthostasis, BP 165/68 with prolonged standing      Pertinent Vitals/Pain Pain Assessment: No/denies pain    Home Living Family/patient expects to be discharged to:: Private residence Living Arrangements: Parent Available Help at Discharge: Family;Available 24 hours/day Type of Home: Apartment Home Access: Level entry   Home Layout: One level Home Equipment: None      Prior Function Level of Independence: Needs assistance  Gait / Transfers Assistance Needed: Pt walked without AD PTA, goes to a day program every morning for 4 hours (they pick him up) ADL's / Homemaking Assistance Needed: Per mom, he normally bathes and dresses himself.     PT Goals (current goals can now be found in the care plan section) Acute Rehab PT Goals Patient Stated Goal: none stated, mom wants him stronger Progress towards PT goals: Progressing toward goals    Frequency    Min 3X/week      PT Plan Current plan remains appropriate    Co-evaluation              AM-PAC PT "6 Clicks" Mobility   Outcome Measure  Help needed turning from your back to your side while in a flat bed without using bedrails?: A Lot Help needed moving from lying on your back to sitting on the side of a flat bed without  using bedrails?: A Lot Help needed moving to and from a bed to a chair (including a wheelchair)?: A Lot Help needed standing up from a chair using your arms (e.g., wheelchair or bedside chair)?: A Lot Help needed to walk in hospital room?: A Lot Help needed climbing 3-5 steps with a railing? : Total 6 Click Score: 11    End of Session Equipment Utilized During Treatment: Gait belt Activity Tolerance: Patient tolerated treatment well Patient left: in chair;with call bell/phone within reach;with chair alarm set Nurse Communication: Mobility status PT Visit Diagnosis: Muscle weakness (generalized) (M62.81);Difficulty in walking, not elsewhere classified (R26.2)     Time: 6301-6010 PT Time Calculation (min) (ACUTE ONLY): 48 min  Charges:  $Therapeutic Activity: 8-22 mins                    Ina Homes, PT, DPT Acute Rehabilitation Services  Pager [phone] Office [phone]  Malachy Chamber 07/20/2020, 1:05 PM

## 2020-07-21 LAB — CULTURE, BLOOD (ROUTINE X 2)
Culture: NO GROWTH
Culture: NO GROWTH

## 2020-08-08 ENCOUNTER — Ambulatory Visit (HOSPITAL_COMMUNITY): Payer: Medicare Other

## 2020-08-09 ENCOUNTER — Other Ambulatory Visit: Payer: Self-pay

## 2020-08-09 ENCOUNTER — Encounter (HOSPITAL_COMMUNITY): Payer: Self-pay

## 2020-08-09 ENCOUNTER — Ambulatory Visit (HOSPITAL_COMMUNITY): Payer: Medicare Other | Admitting: *Deleted

## 2020-08-09 VITALS — BP 104/80 | HR 86 | Ht 73.0 in | Wt 324.0 lb

## 2020-08-09 DIAGNOSIS — F201 Disorganized schizophrenia: Secondary | ICD-10-CM

## 2020-08-09 NOTE — Progress Notes (Signed)
In accompanied by a male cousin. He is nicely dressed and pleasant. Writer commented to him chart indicates he is late for his shot and has been in the hospital since we last saw him, he states he had COVID but doesn't now and feels much better. He got 200 mg in his L deltoid.He states he is still hearing voices but not as often, "they are better". Laughed twice while waiting on his medicine to be given. He is not distressed and offers no complaints. To return in one month for his next injection.

## 2020-08-16 ENCOUNTER — Other Ambulatory Visit: Payer: Self-pay

## 2020-08-16 ENCOUNTER — Encounter (HOSPITAL_COMMUNITY): Payer: Self-pay | Admitting: Psychiatry

## 2020-08-16 ENCOUNTER — Telehealth (INDEPENDENT_AMBULATORY_CARE_PROVIDER_SITE_OTHER): Payer: Medicare Other | Admitting: Psychiatry

## 2020-08-16 DIAGNOSIS — F201 Disorganized schizophrenia: Secondary | ICD-10-CM

## 2020-08-16 MED ORDER — BENZTROPINE MESYLATE 1 MG PO TABS: 1.0000 mg | ORAL_TABLET | Freq: Two times a day (BID) | ORAL | 1 refills | Status: DC

## 2020-08-16 MED ORDER — MELATONIN 10 MG PO CAPS: 10.0000 mg | ORAL_CAPSULE | Freq: Every day | ORAL | 1 refills | Status: DC

## 2020-08-16 MED ORDER — ASENAPINE MALEATE 10 MG SL SUBL
10.0000 mg | SUBLINGUAL_TABLET | Freq: Three times a day (TID) | SUBLINGUAL | 1 refills | Status: DC
Start: 2020-08-16 — End: 2020-09-24

## 2020-08-16 MED ORDER — HALOPERIDOL 10 MG PO TABS: 10.0000 mg | ORAL_TABLET | Freq: Two times a day (BID) | ORAL | 1 refills | Status: DC

## 2020-08-16 NOTE — Progress Notes (Signed)
BH MD/PA/NP OP Progress Note  Virtual Visit via Video Note  I connected with Brandon [name] on 08/16/20 at  3:40 PM EST by a video enabled telemedicine application and verified that I am speaking with the correct person using two identifiers.  Location: Patient: Home Provider: Clinic   I discussed the limitations of evaluation and management by telemedicine and the availability of in person appointments. The patient expressed understanding and agreed to proceed.  I provided 16 minutes of non-face-to-face time during this encounter.      08/16/2020 3:41 PM HABIB KISE  MRN:  [id number]  Chief Complaint: As per mom, " He is doing better now."   HPI: Patient seen with his mother.  Mom reported that patient was not doing too well towards the end of January however once he got his monthly injection dose of Haldol decanoate 200 mg on February 3 she noticed an improvement in his hallucinations and paranoia.  She stated that since then he has been calmer and is doing better now.  She stated that he seems to have returned back to his baseline. Writer informed the mother that Clinical research associate is aware that patient was sick with COVID-19 infection and was hospitalized.  She informed that patient also had a flareup of multiple sclerosis and was not able to walk for like 5 days.  She stated that he is doing much better physically and mentally now. Patient was noted to be making the bed and mother called him to talk to the Clinical research associate. Patient stated that he feels good.  Regarding hallucinations and paranoia, he stated that the voices are controlled now.  He is able to sleep at night. He denied any suicidal or homicidal ideations. He denied any significant concerns or issues today.  Writer reminded the mother about his next Haldol Decanoate injection due date on March 3.  Writer informed that Clinical research associate can see both patient and mother together via virtual visit in 6 weeks from now and mother was agreeable to  that.  Past meds tried- Risperidone, Invega, Abilify, Olanzapine, Clozapine (in 2019), Doxepin, Trazodone.   Visit Diagnosis:  No diagnosis found.  Past Psychiatric History: Schizophrenia, ID and currently being admitted  Past Medical History:  Past Medical History:  Diagnosis Date  . ADHD (attention deficit hyperactivity disorder)   . Bipolar 1 disorder (HCC)   . Chronic back pain   . Chronic constipation   . Chronic neck pain   . Hypertension   . Multiple sclerosis (HCC) 05/20/2013   left sided weakness, dysarthria  . Non-compliance   . Obesity   . Pulmonary embolism (HCC)   . Schizophrenia (HCC)   . Stroke [facility])    left sided deficits - pt's mother denies this    Past Surgical History:  Procedure Laterality Date  . NO PAST SURGERIES    . None    . TOOTH EXTRACTION N/A 06/24/2019   Procedure: DENTAL RESTORATION/EXTRACTION OF TEETH NUMBER ONE, SIXTEEN, SEVENTEEN, NINETEEN, THIRTY-TWO;  Surgeon: Ocie Doyne, DDS;  Location: [facility] OR;  Service: Oral Surgery;  Laterality: N/A;    Family Psychiatric History: Brother- ADHD  Family History:  Family History  Problem Relation Age of Onset  . Diabetes Mother   . ADD / ADHD Brother     Social History:  Social History   Socioeconomic History  . Marital status: Single    Spouse name: Not on file  . Number of children: 0  . Years of education: 11th  . Highest education  level: Not on file  Occupational History  . 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 Smoking status: Current Every Day Smoker    Packs/day: 0.25    Types: Cigarettes  . Smokeless tobacco: Never Used  . Tobacco comment: 2 cigarettes a day  Vaping Use  . Vaping Use: Never used  Substance and Sexual Activity  . Alcohol use: Not Currently    Alcohol/week: 0.0 standard drinks    Comment: "A little bit"   . Drug use: Not Currently    Types: Marijuana    Comment: Last used: unknown   . Sexual activity:  Not on file  Other Topics Concern  . Not on file  Social History Narrative   Patient lives at home with his mother.   Disabled.   Education 11 th grade .   Right handed.   Caffeine - one cup daily soda.   Social Determinants of Health   Financial Resource Strain: Not on file  Food Insecurity: Not on file  Transportation Needs: Not on file  Physical Activity: Not on file  Stress: Not on file  Social Connections: Not on file    Allergies: No Known Allergies  Metabolic Disorder Labs: Lab Results  Component Value Date   HGBA1C 5.9 (H) 04/30/2020   MPG 123 04/30/2020   MPG 122.63 07/31/2017   Lab Results  Component Value Date   PROLACTIN 13.6 04/30/2020   Lab Results  Component Value Date   CHOL 141 04/30/2020   TRIG 134 04/30/2020   HDL 37 (L) 04/30/2020   CHOLHDL 3.8 04/30/2020   VLDL 27 04/30/2020   LDLCALC 77 04/30/2020   LDLCALC 70 01/29/2011   Lab Results  Component Value Date   TSH 2.636 07/18/2020   TSH 4.628 (H) 04/30/2020    Therapeutic Level Labs: Lab Results  Component Value Date   LITHIUM <0.06 (L) 01/25/2018   LITHIUM <0.06 (L) 04/20/2015   Lab Results  Component Value Date   VALPROATE <10 (L) 07/30/2017   VALPROATE 31 (L) 07/24/2017   No components found for:  CBMZ  Current Medications: Current Outpatient Medications  Medication Sig Dispense Refill  . acetaminophen (TYLENOL) 500 MG tablet Take 1 tablet (500 mg total) by mouth every 6 (six) hours as needed. (Patient taking differently: Take 500 mg by mouth every 6 (six) hours as needed for moderate pain.) 30 tablet 0  . amLODipine (NORVASC) 10 MG tablet Take 10 mg by mouth daily.    Marland Kitchen apixaban (ELIQUIS) 5 MG TABS tablet Take 5 mg by mouth 2 (two) times daily.     . Asenapine Maleate 10 MG SUBL Place 1 tablet (10 mg total) under the tongue in the morning, at noon, and at bedtime. 90 tablet 1  . benztropine (COGENTIN) 1 MG tablet Take 1 tablet (1 mg total) by mouth 2 (two) times daily. 60  tablet 1  . docusate sodium (COLACE) 100 MG capsule Take 400 mg by mouth at bedtime.     . haloperidol (HALDOL) 10 MG tablet Take 1 tablet (10 mg total) by mouth 2 (two) times daily. 60 tablet 1  . haloperidol decanoate (HALDOL DECANOATE) 100 MG/ML injection Inject 2 mLs (200 mg total) into the muscle every 30 (thirty) days. 2 mL 6  . latanoprost (XALATAN) 0.005 % ophthalmic solution Place 1 drop into both eyes at bedtime.    . Melatonin 10 MG CAPS TAKE ONE TABLET BY MOUTH AT  BEDTIME (Patient taking differently: Take 10 mg by mouth at bedtime.) 30 capsule 1  . metFORMIN (GLUCOPHAGE-XR) 500 MG 24 hr tablet Take 500 mg by mouth daily with breakfast.    . Multiple Vitamin (MULTIVITAMIN WITH MINERALS) TABS tablet Take 1 tablet by mouth daily.    Marland Kitchen ocrelizumab in sodium chloride 0.9 % 500 mL CHANGE ::  Ocrevus 600mg  IV EVERY 8 (EIGHT) months. 1 each 2   Current Facility-Administered Medications  Medication Dose Route Frequency Provider Last Rate Last Admin  . haloperidol decanoate (HALDOL DECANOATE) 100 MG/ML injection 200 mg  200 mg Intramuscular Q30 days , MD   200 mg at 08/09/20 1145      Psychiatric Specialty Exam: Review of Systems  There were no vitals taken for this visit.There is no height or weight on file to calculate BMI.  General Appearance: Fairly groomed  Eye Contact:  Fair  Speech:  Slow and monotonus  Volume:  Normal  Mood:  Anxious  Affect:  Restricted  Thought Process:  Goal Directed and Descriptions of Associations: Intact, internally preoccupied  Orientation:  Full (Time, Place, and Person)  Thought Content: Illogical, Hallucinations: Auditory and Paranoid Ideation - at baseline  Suicidal Thoughts:  No  Homicidal Thoughts:  No  Memory:  Immediate;   Good Recent;   Good  Judgement:  Fair  Insight:  Fair  Psychomotor Activity:  Normal  Concentration:  Concentration: Good and Attention Span: Fair  Recall:  10/07/20 of Knowledge: Poor  Language: Good   Akathisia:  Negative  Handed:  Right  AIMS (if indicated): 0  Assets:  Communication Skills Desire for Improvement Financial Resources/Insurance Housing Social Support  ADL's:  Intact  Cognition: WNL  Sleep:  Fair     Assessment and Plan: Mom and patient feel that he is returned back to his baseline level of functioning after he got his IM Haldol Decanoate 200 mg dose early February.  They denied any other concerns at this time.  Recommended to continue the same regimen for now.   1. Disorganized schizophrenia (HCC)  - haloperidol decanoate (HALDOL DECANOATE) 100 MG/ML injection; Inject 2 mLs (200 mg total) into the muscle every 30 (thirty) days.  Dispense: 2 mL; Refill: 6 - haloperidol (HALDOL) 10 MG tablet; Take 1 tablet (10 mg total) by mouth 2 (two) times daily.  Dispense: 60 tablet; Refill: 1 - Asenapine Maleate 10 MG SUBL; Place 1 tablet (10 mg total) under the tongue in the morning, at noon, and at bedtime.  Dispense: 90 tablet; Refill: 1 - benztropine (COGENTIN) 1 MG tablet; Take 1 tablet (1 mg total) by mouth 2 (two) times daily.  Dispense: 60 tablet; Refill: 1  2. Unspecified intellectual disabilities   Return back to clinic March 3rd for next dose of injection.   Follow-up with writer in 6 weeks. Continue group therapy.  04-13-1986, MD 08/16/2020, 3:41 PM

## 2020-08-30 ENCOUNTER — Telehealth: Payer: Self-pay | Admitting: Neurology

## 2020-08-30 NOTE — Telephone Encounter (Signed)
Spoke to mother and patient. Patient in group therapy daily every week for the last 4 months. This last week he has not been participating as much in group. Apparently he is having headaches. He has had migraines in the past per mother and was given some medication she was not sure of the name. I relayed Dr. Anne Hahn and Maralyn Sago not hear today. Will be back on Monday will be glad to send her a message  regarding this and will follow up on Monday. He may take Tylenol or Motrin as needed. If needed see pcp as well for acute treatment.

## 2020-08-30 NOTE — Telephone Encounter (Signed)
Pt's mother,Runette W. Derald Macleod called, last week he started having headaches. Can physician prescribe him something for headaches. Would like a call from the nurse.

## 2020-09-03 MED ORDER — TOPIRAMATE 25 MG PO TABS
25.0000 mg | ORAL_TABLET | Freq: Two times a day (BID) | ORAL | 0 refills | Status: DC
Start: 2020-09-03 — End: 2020-09-19

## 2020-09-03 NOTE — Telephone Encounter (Signed)
Left message for mother of patient to return call.  This is regarding his headaches.  Per Maralyn Sago he was on Topamax previously for his headaches and she related that we can restart this, working up to where he would take 50 mg twice a day.  I will just wait for a return call from her.

## 2020-09-03 NOTE — Telephone Encounter (Signed)
Spoke to mother relayed message she was ok to start this.  [employer]

## 2020-09-03 NOTE — Telephone Encounter (Signed)
He was previously on Topamax for headaches, we can restart this working up to 50 mg twice daily.

## 2020-09-03 NOTE — Telephone Encounter (Signed)
Pt's mother returned phone call. Would like a call back.

## 2020-09-06 ENCOUNTER — Encounter (HOSPITAL_COMMUNITY): Payer: Self-pay

## 2020-09-06 ENCOUNTER — Other Ambulatory Visit: Payer: Self-pay

## 2020-09-06 ENCOUNTER — Telehealth (HOSPITAL_COMMUNITY): Payer: Self-pay | Admitting: *Deleted

## 2020-09-06 ENCOUNTER — Ambulatory Visit (HOSPITAL_COMMUNITY): Payer: Medicare Other | Admitting: *Deleted

## 2020-09-06 VITALS — BP 129/87 | HR 87 | Ht 73.0 in | Wt 322.0 lb

## 2020-09-06 DIAGNOSIS — F201 Disorganized schizophrenia: Secondary | ICD-10-CM

## 2020-09-06 MED ORDER — TRAZODONE HCL 100 MG PO TABS: ORAL_TABLET | ORAL | 1 refills | Status: DC

## 2020-09-06 NOTE — Telephone Encounter (Signed)
Called patients mom to explain the addition of the Trazodone for him to improve his sleep, which she reported was a concern.. She expressed her understanding;

## 2020-09-06 NOTE — Addendum Note (Signed)
Addended by: Zena Amos on: 09/06/2020 11:58 AM   Modules accepted: Orders

## 2020-09-06 NOTE — Telephone Encounter (Signed)
I am sending a new patient for trazodone 100 mg.  I know he has taken it in the past however advise the mother to try 1 tablet at bedtime and if one is not effective he can take 2 tablets.

## 2020-09-06 NOTE — Progress Notes (Signed)
In for monthly Haldol D injection. Received it today in his R Deltoid. He is in good spirits today, pleasant and denies any problems. He has lost 2 lbs since last month. Mom was called by grandma to find out if she has any concerns and she said he is not sleeping well and would like the dr to consider another pill at night to help him sleep. He does at times talk to himself, when asked he said he is talking to "no one" To return in one month for his next injection.

## 2020-09-06 NOTE — Telephone Encounter (Signed)
Grandma brought patient in for his monthly injection appt. Called mom while here to see if she has any concerns, as he lives with his mom. His mom said she would like to see him get another pill at night to help him sleep. Reviewed record and sleeping pill I see is Melatonin 10 mg. Will make concern know to Dr Evelene Croon to see if she would like to make any changes.

## 2020-09-13 ENCOUNTER — Emergency Department (HOSPITAL_COMMUNITY): Payer: Medicare Other

## 2020-09-13 ENCOUNTER — Inpatient Hospital Stay (HOSPITAL_COMMUNITY)
Admission: EM | Admit: 2020-09-13 | Discharge: 2020-09-17 | DRG: 071 | Disposition: A | Discharge status: Home / Self Care | Payer: Medicare Other | Attending: Internal Medicine | Admitting: Internal Medicine

## 2020-09-13 ENCOUNTER — Observation Stay (HOSPITAL_COMMUNITY): Payer: Medicare Other

## 2020-09-13 ENCOUNTER — Encounter (HOSPITAL_COMMUNITY): Payer: Self-pay

## 2020-09-13 DIAGNOSIS — Z6841 Body Mass Index (BMI) 40.0 and over, adult: Secondary | ICD-10-CM

## 2020-09-13 DIAGNOSIS — R443 Hallucinations, unspecified: Secondary | ICD-10-CM | POA: Diagnosis present

## 2020-09-13 DIAGNOSIS — F17210 Nicotine dependence, cigarettes, uncomplicated: Secondary | ICD-10-CM | POA: Diagnosis present

## 2020-09-13 DIAGNOSIS — G35 Multiple sclerosis: Secondary | ICD-10-CM | POA: Diagnosis present

## 2020-09-13 DIAGNOSIS — F250 Schizoaffective disorder, bipolar type: Secondary | ICD-10-CM | POA: Diagnosis present

## 2020-09-13 DIAGNOSIS — I10 Essential (primary) hypertension: Secondary | ICD-10-CM | POA: Diagnosis present

## 2020-09-13 DIAGNOSIS — R739 Hyperglycemia, unspecified: Secondary | ICD-10-CM

## 2020-09-13 DIAGNOSIS — R471 Dysarthria and anarthria: Secondary | ICD-10-CM | POA: Diagnosis present

## 2020-09-13 DIAGNOSIS — F909 Attention-deficit hyperactivity disorder, unspecified type: Secondary | ICD-10-CM | POA: Diagnosis present

## 2020-09-13 DIAGNOSIS — F201 Disorganized schizophrenia: Secondary | ICD-10-CM | POA: Diagnosis present

## 2020-09-13 DIAGNOSIS — F209 Schizophrenia, unspecified: Secondary | ICD-10-CM

## 2020-09-13 DIAGNOSIS — M21372 Foot drop, left foot: Secondary | ICD-10-CM | POA: Diagnosis present

## 2020-09-13 DIAGNOSIS — I2699 Other pulmonary embolism without acute cor pulmonale: Secondary | ICD-10-CM | POA: Diagnosis present

## 2020-09-13 DIAGNOSIS — R519 Headache, unspecified: Secondary | ICD-10-CM | POA: Diagnosis present

## 2020-09-13 DIAGNOSIS — W19XXXA Unspecified fall, initial encounter: Secondary | ICD-10-CM | POA: Diagnosis present

## 2020-09-13 DIAGNOSIS — Z818 Family history of other mental and behavioral disorders: Secondary | ICD-10-CM

## 2020-09-13 DIAGNOSIS — R262 Difficulty in walking, not elsewhere classified: Secondary | ICD-10-CM

## 2020-09-13 DIAGNOSIS — R531 Weakness: Secondary | ICD-10-CM

## 2020-09-13 DIAGNOSIS — G9341 Metabolic encephalopathy: Secondary | ICD-10-CM | POA: Diagnosis not present

## 2020-09-13 DIAGNOSIS — Z7984 Long term (current) use of oral hypoglycemic drugs: Secondary | ICD-10-CM

## 2020-09-13 DIAGNOSIS — N3001 Acute cystitis with hematuria: Secondary | ICD-10-CM | POA: Diagnosis present

## 2020-09-13 DIAGNOSIS — Z79891 Long term (current) use of opiate analgesic: Secondary | ICD-10-CM

## 2020-09-13 DIAGNOSIS — R296 Repeated falls: Secondary | ICD-10-CM | POA: Diagnosis present

## 2020-09-13 DIAGNOSIS — K5909 Other constipation: Secondary | ICD-10-CM | POA: Diagnosis present

## 2020-09-13 DIAGNOSIS — Z7901 Long term (current) use of anticoagulants: Secondary | ICD-10-CM

## 2020-09-13 DIAGNOSIS — Z9181 History of falling: Secondary | ICD-10-CM

## 2020-09-13 DIAGNOSIS — G8929 Other chronic pain: Secondary | ICD-10-CM | POA: Diagnosis present

## 2020-09-13 DIAGNOSIS — Z86711 Personal history of pulmonary embolism: Secondary | ICD-10-CM

## 2020-09-13 DIAGNOSIS — F79 Unspecified intellectual disabilities: Secondary | ICD-10-CM

## 2020-09-13 DIAGNOSIS — Z79899 Other long term (current) drug therapy: Secondary | ICD-10-CM

## 2020-09-13 DIAGNOSIS — I69354 Hemiplegia and hemiparesis following cerebral infarction affecting left non-dominant side: Secondary | ICD-10-CM

## 2020-09-13 LAB — COMPREHENSIVE METABOLIC PANEL
ALT: 33 U/L (ref 0–44)
AST: 16 U/L (ref 15–41)
Albumin: 4.8 g/dL (ref 3.5–5.0)
Alkaline Phosphatase: 84 U/L (ref 38–126)
Anion gap: 8 (ref 5–15)
BUN: 16 mg/dL (ref 6–20)
CO2: 23 mmol/L (ref 22–32)
Calcium: 9.2 mg/dL (ref 8.9–10.3)
Chloride: 107 mmol/L (ref 98–111)
Creatinine, Ser: 1.07 mg/dL (ref 0.61–1.24)
GFR, Estimated: >60 mL/min (ref >60)
Glucose, Bld: 97 mg/dL (ref 70–99)
Potassium: 4.3 mmol/L (ref 3.5–5.1)
Sodium: 138 mmol/L (ref 135–145)
Total Bilirubin: 0.7 mg/dL (ref 0.3–1.2)
Total Protein: 7.4 g/dL (ref 6.5–8.1)

## 2020-09-13 LAB — CBC WITH DIFFERENTIAL/PLATELET
Abs Immature Granulocytes: 0.02 10*3/uL (ref 0.00–0.07)
Basophils Absolute: 0 10*3/uL (ref 0.0–0.1)
Basophils Relative: 1 %
Eosinophils Absolute: 0.2 10*3/uL (ref 0.0–0.5)
Eosinophils Relative: 3 %
HCT: 42.5 % (ref 39.0–52.0)
Hemoglobin: 13.1 g/dL (ref 13.0–17.0)
Immature Granulocytes: 0 %
Lymphocytes Relative: 23 %
Lymphs Abs: 1.3 10*3/uL (ref 0.7–4.0)
MCH: 25.7 pg — ABNORMAL LOW (ref 26.0–34.0)
MCHC: 30.8 g/dL (ref 30.0–36.0)
MCV: 83.3 fL (ref 80.0–100.0)
Monocytes Absolute: 0.5 10*3/uL (ref 0.1–1.0)
Monocytes Relative: 9 %
Neutro Abs: 3.7 10*3/uL (ref 1.7–7.7)
Neutrophils Relative %: 64 %
Platelets: 189 10*3/uL (ref 150–400)
RBC: 5.1 MIL/uL (ref 4.22–5.81)
RDW: 14.5 % (ref 11.5–15.5)
WBC: 5.8 10*3/uL (ref 4.0–10.5)
nRBC: 0 % (ref 0.0–0.2)

## 2020-09-13 LAB — SALICYLATE LEVEL: Salicylate Lvl: <7 mg/dL — ABNORMAL LOW (ref 7.0–30.0)

## 2020-09-13 LAB — RAPID URINE DRUG SCREEN, HOSP PERFORMED
Amphetamines: NOT DETECTED
Barbiturates: NOT DETECTED
Benzodiazepines: NOT DETECTED
Cocaine: NOT DETECTED
Opiates: NOT DETECTED
Tetrahydrocannabinol: NOT DETECTED

## 2020-09-13 LAB — URINALYSIS, ROUTINE W REFLEX MICROSCOPIC
Bilirubin Urine: NEGATIVE
Glucose, UA: NEGATIVE mg/dL
Hgb urine dipstick: NEGATIVE
Ketones, ur: NEGATIVE mg/dL
Nitrite: NEGATIVE
Protein, ur: NEGATIVE mg/dL
Specific Gravity, Urine: 1.017 (ref 1.005–1.030)
WBC, UA: >50 WBC/hpf — ABNORMAL HIGH (ref 0–5)
pH: 7 (ref 5.0–8.0)

## 2020-09-13 LAB — ACETAMINOPHEN LEVEL: Acetaminophen (Tylenol), Serum: <10 ug/mL — ABNORMAL LOW (ref 10–30)

## 2020-09-13 LAB — ETHANOL: Alcohol, Ethyl (B): <10 mg/dL (ref <10)

## 2020-09-13 MED ORDER — HALOPERIDOL LACTATE 5 MG/ML IJ SOLN
5.0000 mg | Freq: Once | INTRAMUSCULAR | Status: AC
  Administered 2020-09-13: 5 mg via INTRAVENOUS
  Filled 2020-09-13: qty 1

## 2020-09-13 MED ORDER — MIDAZOLAM HCL 2 MG/2ML IJ SOLN: 2.0000 mg | Freq: Once | INTRAMUSCULAR | Status: DC

## 2020-09-13 MED ORDER — MELATONIN 5 MG PO TABS
10.0000 mg | ORAL_TABLET | Freq: Every day | ORAL | Status: DC
  Administered 2020-09-13 – 2020-09-16 (×4): 10 mg via ORAL
  Filled 2020-09-13 (×4): qty 2

## 2020-09-13 MED ORDER — LORAZEPAM 2 MG/ML IJ SOLN
1.0000 mg | Freq: Once | INTRAMUSCULAR | Status: AC
  Administered 2020-09-13: 1 mg via INTRAVENOUS
  Filled 2020-09-13: qty 1

## 2020-09-13 MED ORDER — ACETAMINOPHEN 650 MG RE SUPP: 650.0000 mg | Freq: Four times a day (QID) | RECTAL | Status: DC | PRN

## 2020-09-13 MED ORDER — TOPIRAMATE 25 MG PO TABS
25.0000 mg | ORAL_TABLET | Freq: Two times a day (BID) | ORAL | Status: DC
  Administered 2020-09-13 – 2020-09-17 (×8): 25 mg via ORAL
  Filled 2020-09-13 (×8): qty 1

## 2020-09-13 MED ORDER — SODIUM CHLORIDE 0.9 % IV SOLN
1.0000 g | Freq: Once | INTRAVENOUS | Status: AC
  Administered 2020-09-13: 1 g via INTRAVENOUS
  Filled 2020-09-13: qty 10

## 2020-09-13 MED ORDER — ASENAPINE MALEATE 5 MG SL SUBL
10.0000 mg | SUBLINGUAL_TABLET | Freq: Three times a day (TID) | SUBLINGUAL | Status: DC
  Administered 2020-09-13 – 2020-09-17 (×11): 10 mg via SUBLINGUAL
  Filled 2020-09-13 (×13): qty 2

## 2020-09-13 MED ORDER — ACETAMINOPHEN 325 MG PO TABS
650.0000 mg | ORAL_TABLET | Freq: Four times a day (QID) | ORAL | Status: DC | PRN
  Administered 2020-09-15: 650 mg via ORAL
  Filled 2020-09-13: qty 2

## 2020-09-13 MED ORDER — SODIUM CHLORIDE 0.9% FLUSH: 3.0000 mL | Freq: Two times a day (BID) | INTRAVENOUS | Status: DC

## 2020-09-13 MED ORDER — SODIUM CHLORIDE 0.9 % IV BOLUS
1000.0000 mL | Freq: Once | INTRAVENOUS | Status: AC
  Administered 2020-09-13: 1000 mL via INTRAVENOUS

## 2020-09-13 MED ORDER — BENZTROPINE MESYLATE 0.5 MG PO TABS
1.0000 mg | ORAL_TABLET | Freq: Two times a day (BID) | ORAL | Status: DC
  Administered 2020-09-13 – 2020-09-17 (×8): 1 mg via ORAL
  Filled 2020-09-13: qty 1
  Filled 2020-09-13: qty 2
  Filled 2020-09-13: qty 1
  Filled 2020-09-13 (×5): qty 2

## 2020-09-13 MED ORDER — AMLODIPINE BESYLATE 10 MG PO TABS
10.0000 mg | ORAL_TABLET | Freq: Every day | ORAL | Status: DC
  Administered 2020-09-14 – 2020-09-17 (×4): 10 mg via ORAL
  Filled 2020-09-13: qty 2
  Filled 2020-09-13 (×3): qty 1

## 2020-09-13 MED ORDER — TRAZODONE HCL 100 MG PO TABS
100.0000 mg | ORAL_TABLET | Freq: Every evening | ORAL | Status: DC | PRN
  Filled 2020-09-13: qty 1

## 2020-09-13 MED ORDER — POLYETHYLENE GLYCOL 3350 17 G PO PACK
17.0000 g | PACK | Freq: Every day | ORAL | Status: DC | PRN
  Filled 2020-09-13: qty 1

## 2020-09-13 MED ORDER — HALOPERIDOL 5 MG PO TABS
10.0000 mg | ORAL_TABLET | Freq: Two times a day (BID) | ORAL | Status: DC
  Administered 2020-09-13 – 2020-09-17 (×8): 10 mg via ORAL
  Filled 2020-09-13 (×8): qty 2

## 2020-09-13 MED ORDER — SODIUM CHLORIDE 0.9 % IV SOLN: 1.0000 g | Freq: Once | INTRAVENOUS | Status: DC

## 2020-09-13 MED ORDER — APIXABAN 5 MG PO TABS
5.0000 mg | ORAL_TABLET | Freq: Two times a day (BID) | ORAL | Status: DC
  Administered 2020-09-13 – 2020-09-17 (×8): 5 mg via ORAL
  Filled 2020-09-13 (×8): qty 1

## 2020-09-13 MED ORDER — METFORMIN HCL ER 500 MG PO TB24
500.0000 mg | ORAL_TABLET | Freq: Every day | ORAL | Status: DC
  Administered 2020-09-14 – 2020-09-17 (×4): 500 mg via ORAL
  Filled 2020-09-13 (×4): qty 1

## 2020-09-13 MED ORDER — CEFDINIR 300 MG PO CAPS
300.0000 mg | ORAL_CAPSULE | Freq: Two times a day (BID) | ORAL | Status: DC
  Administered 2020-09-14 – 2020-09-16 (×5): 300 mg via ORAL
  Filled 2020-09-13 (×5): qty 1

## 2020-09-13 NOTE — H&P (Signed)
History and Physical   [name] RCV:[id number] DOB: [birth year] DOA: 09/13/2020  PCP: Brandon Roup D, FNP   Patient coming from: Home  Chief Complaint: Falls  HPI: Brandon [name] is a 30 y.o. male with medical history significant of gait abnormality, ADHD, disorganized schizophrenia, intellectual disability, hypertension, prior stroke with left-sided weakness, multiple sclerosis, obesity, history of PE who presents after having multiple falls and hallucinations at home.  History has been obtained with assistance as his mother due to his psychiatric history and intellectual disability.  She is his legal guardian.  He has reportedly had 3 falls over the past week and had to call EMS for assistance with getting back up.  He fell on Monday and Tuesday and today as well.  She states that when he falls he supports himself to sit down and does not have a traumatic fall.  He stays down until he feels he is trying up to get back up.  He has had similar episodes in the past where he had them every 4 to 5 months last year.  His mother states that the frequency has increased.  She is also noted urinary incontinence that occurred yesterday and Wednesday of this week.  With he has had a total of 7 episodes. She also reports that he has had some episodes of spasms in his legs and in his left arm and when he is having the spasms he has told his mother that he has heard voices in his head telling that he is paralyzed and disabled.  She also reports some concern for his headaches as he has hit himself in the head in order to get the voices to stop in the past.  Due to headaches he had been restarted on Topamax by his neurologist.   He Denies fevers, chills, chest pain, shortness breath, abdominal pain, constipation, diarrhea, nausea, vomiting.  ED Course: Vital signs in the ED have been stable.  Lab work-up showed CBC within normal limits, CBC within normal notes.  Negative ethanol level, negative Tylenol  level, negative aspirin level, UDS pending.  Respiratory panel flu COVID not obtained because he had recent positive in the last 90 days.  Urinalysis showed leukocytes white count and bacteria with urine culture pending.  Imaging results showed negative right ankle exam, negative right foot x-ray, negative left hip x-ray, negative chest x-ray, CT head with no acute changes and stable chronic changes.  MR brain with stable chronic changes and no acute changes noted, MR spine with stable chronic changes no acute changes noted.  However both MRIs were of low quality due to movement.  Attempted to get thoracic spine but he did not tolerate.  Neurology was consulted in the ED who recommend getting the thoracic imaging and have low suspicion for MS flare however cannot rule it out without additional imaging which she has not been able to tolerate.  State to reconsult them if imaging comes back showing new lesions.  Also recommend to consult psych as this may be a psych related issue given his history and hearing voices this consult has been placed as well but psych his not yet seen the patient.  Patient received Ativan Haldol and ceftriaxone in the ED in addition to 1 L bolus.  Review of Systems: As per HPI otherwise all other systems reviewed and are negative.  Past Medical History:  Diagnosis Date  . ADHD (attention deficit hyperactivity disorder)   . Bipolar 1 disorder (HCC)   . Chronic back  pain   . Chronic constipation   . Chronic neck pain   . Hypertension   . Multiple sclerosis (HCC) 05/20/2013   left sided weakness, dysarthria  . Non-compliance   . Obesity   . Pulmonary embolism (HCC)   . Schizophrenia (HCC)   . Stroke [facility])    left sided deficits - pt's mother denies this    Past Surgical History:  Procedure Laterality Date  . NO PAST SURGERIES    . None    . TOOTH EXTRACTION N/A 06/24/2019   Procedure: DENTAL RESTORATION/EXTRACTION OF TEETH NUMBER ONE, SIXTEEN, SEVENTEEN, NINETEEN,  THIRTY-TWO;  Surgeon: Brandon [name];  Location: [facility] OR;  Service: Oral Surgery;  Laterality: N/A;    Social History  reports that he has been smoking cigarettes. He has been smoking about 0.25 packs per day. He has never used smokeless tobacco. He reports previous alcohol use. He reports previous drug use. Drug: Marijuana.  No Known Allergies  Family History  Problem Relation Age of Onset  . Diabetes Mother   . ADD / ADHD Brother   Reviewed on admission  Prior to Admission medications   Medication Sig Start Date End Date Taking? Authorizing Provider  acetaminophen (TYLENOL) 500 MG tablet Take 1 tablet (500 mg total) by mouth every 6 (six) hours as needed. Patient taking differently: Take 500 mg by mouth every 6 (six) hours as needed for moderate pain. 06/21/20   Brandon [name]  amLODipine (NORVASC) 10 MG tablet Take 10 mg by mouth daily.    [provider]  apixaban (ELIQUIS) 5 MG TABS tablet Take 5 mg by mouth 2 (two) times daily.     [provider]  Brandon [name] 10 MG SUBL Place 1 tablet (10 mg total) under the tongue in the morning, at noon, and at bedtime. 08/16/20   Brandon Amos, MD  benztropine (COGENTIN) 1 MG tablet Take 1 tablet (1 mg total) by mouth 2 (two) times daily. 08/16/20   Brandon Amos, MD  docusate sodium (COLACE) 100 MG capsule Take 400 mg by mouth at bedtime.     [provider]  haloperidol (HALDOL) 10 MG tablet Take 1 tablet (10 mg total) by mouth 2 (two) times daily. 08/16/20   Brandon Amos, MD  haloperidol decanoate (HALDOL DECANOATE) 100 MG/ML injection Inject 2 mLs (200 mg total) into the muscle every 30 (thirty) days. 07/16/20   Brandon Amos, MD  latanoprost (XALATAN) 0.005 % ophthalmic solution Place 1 drop into both eyes at bedtime.    [provider]  Melatonin 10 MG CAPS Take 10 mg by mouth at bedtime. 08/16/20   Brandon Amos, MD  metFORMIN (GLUCOPHAGE-XR) 500 MG 24 hr tablet Take 500 mg by mouth daily with  breakfast.    [provider]  Multiple Vitamin (MULTIVITAMIN WITH MINERALS) TABS tablet Take 1 tablet by mouth daily.    [provider]  ocrelizumab in sodium chloride 0.9 % 500 mL CHANGE ::  Ocrevus 600mg  IV EVERY 8 (EIGHT) months. 11/10/19   01/10/20, NP  topiramate (TOPAMAX) 25 MG tablet Take 1 tablet (25 mg total) by mouth 2 (two) times daily. 09/03/20   09/05/20, NP  traZODone (DESYREL) 100 MG tablet Take 1 to 2 tablets at bedtime as needed for sleep. 09/06/20   11/06/20, MD  propranolol (INDERAL) 10 MG tablet Take 1 tablet (10 mg total) by mouth 2 (two) times daily. 05/03/15 01/03/20  01/05/20, NP    Physical  Exam: Vitals:   09/13/20 1415 09/13/20 1705 09/13/20 1708 09/13/20 1712  BP: (!) 142/88 139/85 (!) 150/115 (!) 127/58  Pulse: 80 71 79 95  Resp: 18     Temp:      TempSrc:      SpO2: 99% 100% 100% 98%   Physical Exam Constitutional:      General: He is not in acute distress.    Appearance: Normal appearance. He is obese.     Comments: Baseline slurred speech.  Obese male appears older than stated age.  HENT:     Head: Normocephalic and atraumatic.     Mouth/Throat:     Mouth: Mucous membranes are moist.     Pharynx: Oropharynx is clear.  Eyes:     Extraocular Movements: Extraocular movements intact.     Pupils: Pupils are equal, round, and reactive to light.  Cardiovascular:     Rate and Rhythm: Normal rate and regular rhythm.     Pulses: Normal pulses.     Heart sounds: Normal heart sounds.  Pulmonary:     Effort: Pulmonary effort is normal. No respiratory distress.     Breath sounds: Normal breath sounds.  Abdominal:     General: Bowel sounds are normal. There is no distension.     Palpations: Abdomen is soft.     Tenderness: There is abdominal tenderness (Suprapubic).  Musculoskeletal:        General: No swelling or deformity.  Skin:    General: Skin is warm and dry.  Neurological:     General: No focal deficit  present.     Mental Status: Mental status is at baseline.    Labs on Admission: I have personally reviewed following labs and imaging studies  CBC: Recent Labs  Lab 09/13/20 1023  WBC 5.8  NEUTROABS 3.7  HGB 13.1  HCT 42.5  MCV 83.3  PLT 189    Basic Metabolic Panel: Recent Labs  Lab 09/13/20 1023  NA 138  K 4.3  CL 107  CO2 23  GLUCOSE 97  BUN 16  CREATININE 1.07  CALCIUM 9.2    GFR: Estimated Creatinine Clearance: 154.7 mL/min (by C-G formula based on SCr of 1.07 mg/dL).  Liver Function Tests: Recent Labs  Lab 09/13/20 1023  AST 16  ALT 33  ALKPHOS 84  BILITOT 0.7  PROT 7.4  ALBUMIN 4.8    Urine analysis:    Component Value Date/Time   COLORURINE YELLOW 09/13/2020 1656   APPEARANCEUR HAZY (A) 09/13/2020 1656   LABSPEC 1.017 09/13/2020 1656   PHURINE 7.0 09/13/2020 1656   GLUCOSEU NEGATIVE 09/13/2020 1656   HGBUR NEGATIVE 09/13/2020 1656   HGBUR negative 01/14/2010 1451   BILIRUBINUR NEGATIVE 09/13/2020 1656   KETONESUR NEGATIVE 09/13/2020 1656   PROTEINUR NEGATIVE 09/13/2020 1656   UROBILINOGEN 1.0 03/13/2015 0223   NITRITE NEGATIVE 09/13/2020 1656   LEUKOCYTESUR LARGE (A) 09/13/2020 1656    Radiological Exams on Admission: DG Ankle Complete Right  Result Date: 09/13/2020 CLINICAL DATA:  30 year old male with pain.  No known injury. EXAM: RIGHT ANKLE - COMPLETE 3+ VIEW COMPARISON:  None. FINDINGS: Bone mineralization is within normal limits. There is no evidence of fracture, dislocation, or joint effusion. There is no evidence of arthropathy or other focal bone abnormality. Soft tissues are unremarkable. IMPRESSION: Negative. Electronically Signed   By: Odessa Fleming M.[name].   On: 09/13/2020 10:19   CT Head Wo Contrast  Result Date: 09/13/2020 CLINICAL DATA:  30 year old male status post fall 2  days ago. Altered mental status. History of multiple sclerosis. EXAM: CT HEAD WITHOUT CONTRAST TECHNIQUE: Contiguous axial images were obtained from the base of  the skull through the vertex without intravenous contrast. COMPARISON:  Brain MRI 07/17/2020 the, head CT 07/16/2020. FINDINGS: Brain: Stable cerebral volume, ex vacuo ventricular enlargement. Patchy and confluent white matter hypodensity greater in the right hemisphere appears stable. No superimposed midline shift, ventriculomegaly, mass effect, evidence of mass lesion, intracranial hemorrhage or evidence of cortically based acute infarction. Vascular: No suspicious intracranial vascular hyperdensity. Skull: No acute osseous abnormality identified. Sinuses/Orbits: Sinuses, tympanic cavities and mastoids are hyperplastic and clear. Other: Similar leftward gaze to the prior CT. No acute orbit or scalp soft tissue finding. IMPRESSION: 1. No acute intracranial abnormality or acute traumatic injury identified. 2. Stable advanced white matter disease and associated cerebral volume loss Electronically Signed   By: Odessa Fleming M.[name].   On: 09/13/2020 10:25   MR BRAIN WO CONTRAST  Result Date: 09/13/2020 CLINICAL DATA:  Multiple sclerosis EXAM: MRI HEAD WITHOUT CONTRAST TECHNIQUE: Multiplanar, multiecho pulse sequences of the brain and surrounding structures were obtained without intravenous contrast. COMPARISON:  None. FINDINGS: Motion artifact is present. Brain: Patchy and confluent areas of T2 hyperintensity are again identified in the supratentorial periventricular greater than subcortical white matter. Burden does not appear to changed. Involvement of the posterior fossa is less apparent on this study likely on a technical basis. Prominence of the ventricles and sulci reflects stable parenchymal volume loss. Unchanged thinning of the corpus callosum. There is no acute infarction or intracranial hemorrhage. There is no intracranial mass, mass effect, or edema. There is no hydrocephalus or extra-axial fluid collection. Vascular: Major vessel flow voids at the skull base are preserved. Skull and upper cervical spine: Normal  marrow signal is preserved. Sinuses/Orbits: Paranasal sinuses are aerated. Orbits are unremarkable. Other: Sella is unremarkable.  Mastoid air cells are clear. IMPRESSION: No apparent change in burden of chronic demyelination. Stable parenchymal volume loss. Electronically Signed   By: Guadlupe Spanish M.[name].   On: 09/13/2020 14:13   MR CERVICAL SPINE WO CONTRAST  Result Date: 09/13/2020 CLINICAL DATA:  Multiple sclerosis EXAM: MRI CERVICAL SPINE WITHOUT CONTRAST TECHNIQUE: Multiplanar, multisequence MR imaging of the cervical spine was performed. No intravenous contrast was administered. COMPARISON:  07/17/2020 FINDINGS: Motion degraded. Alignment: Unchanged. Vertebrae: Vertebral body heights are stable. No apparent marrow edema. Cord: Poor evaluation of the cord due to artifact. T2 hyperintensity appears to be present at the C4 and C5 levels as before. No definite new abnormality. Posterior Fossa, vertebral arteries, paraspinal tissues: Brain reported separately. Otherwise no abnormality within above limitation. Disc levels: No degenerative stenosis identified. IMPRESSION: Significantly motion degraded with poor evaluation of the cord. Chronic demyelination at C4 and C5 is again identified. No definite new abnormality. Electronically Signed   By: Guadlupe Spanish M.[name].   On: 09/13/2020 14:19   DG Chest Port 1 View  Result Date: 09/13/2020 CLINICAL DATA:  Rule out infection EXAM: PORTABLE CHEST 1 VIEW COMPARISON:  07/16/2020 FINDINGS: The heart size and mediastinal contours are within normal limits. Both lungs are clear. The visualized skeletal structures are unremarkable. IMPRESSION: No active disease. Electronically Signed   By: Marlan Palau M.[name].   On: 09/13/2020 11:09   DG Foot Complete Right  Result Date: 09/13/2020 CLINICAL DATA:  30 year old male with pain. No known injury. EXAM: RIGHT FOOT COMPLETE - 3+ VIEW COMPARISON:  Right ankle series today. FINDINGS: Bone mineralization is within normal limits.  There  is no evidence of fracture or dislocation. There is no evidence of arthropathy or other focal bone abnormality. Soft tissues are unremarkable. IMPRESSION: Negative. Electronically Signed   By: Odessa FlemingH  Hall M.[name].   On: 09/13/2020 10:20   DG Hip Unilat With Pelvis 2-3 Views Left  Result Date: 09/13/2020 CLINICAL DATA:  30 year old male with pain. No known injury. EXAM: DG HIP (WITH OR WITHOUT PELVIS) 2-3V LEFT COMPARISON:  Left hip series 04/19/2016. FINDINGS: Bone mineralization is within normal limits. Hip joint spaces appear stable and preserved. There is no evidence of hip fracture or dislocation. There is no evidence of arthropathy or other focal bone abnormality. SI joints appear normal. Stable, negative lower abdominal and pelvic visceral contours. IMPRESSION: Negative. Electronically Signed   By: Odessa FlemingH  Hall M.[name].   On: 09/13/2020 10:21   EKG: Not yet obtained  Assessment/Plan Principal Problem:   Falls Active Problems:   HTN (hypertension)   Multiple sclerosis (HCC)   Left-sided weakness   Hallucinations   Disorganized schizophrenia (HCC)   Schizoaffective disorder, bipolar type (HCC)   Pulmonary embolism (HCC)   Unspecified intellectual disabilities  Falls History of multiple sclerosis Disorganized schizophrenia Schizoaffective disorder bipolar type Intellectual disability Left-sided foot drop, chronic > Patient with more frequent "falls" which are more consistent with assisted falls where he opens up to the ground and gets up when he feels like he has the strength again. > Most likely etiology is psychogenic given his psych history and hearing voices and having spasms with reportedly hearing voices telling him that he is paralyzed at that time per his mother. > However does have a history of MS and neurology was consulted and cannot rule out based on the imaging done so far due to low quality imaging.  And he has been unable to tolerate additional MRI. - Appreciate neurology  recommendations, they are not following and states to reconsult if MRI is able to be formed and showed new lesions - Appreciate psychiatry recommendations, they have not had a chance to consult on the patient yet. - MRI thoracic spine when able, due to poor tolerance of exams already done and low quality of these exams may need to wait until the morning until there are more options for higher levels of sedation if required. - MRI quality of head and C-spine was low and may end up needing repeat to these under sedation as well - Continue home Ocrevus - Continue home Haldol, Brandon - Continue home trazodone and melatonin - Continue home Cogentin  UTI > Patient with leukocytes, white blood cells, bacteria on UA.  No leukocytosis, but mother does report new symptoms of urinary incontinence. > Received ceftriaxone in ED - Given no systemic symptoms at this time we will plan to switch to p.o. Cefdinir - follow-up culture results.  Headaches - Continue topamax  Hypertension - Continue home   History of PE - Continue home Eliquis   DVT prophylaxis: Eliquis  Code Status:   Full  Family Communication:  Brandon [name] with mother by phone and updated her. Disposition Plan:   Patient is from:  Home  Anticipated DC to:  Home  Anticipated DC date:  1 to 2 days  Anticipated DC barriers: None  Consults called:  Neurology consulted in the ED are not following and stay to reconsult if lesions are noted on MRI.  Psychiatry consulted in ED have not consulted yet.  Admission status:  Observation, telemetry   Severity of Illness: The appropriate patient status for this patient  is OBSERVATION. Observation status is judged to be reasonable and necessary in order to provide the required intensity of service to ensure the patient's safety. The patient's presenting symptoms, physical exam findings, and initial radiographic and laboratory data in the context of their medical condition is felt to place them at  decreased risk for further clinical deterioration. Furthermore, it is anticipated that the patient will be medically stable for discharge from the hospital within 2 midnights of admission. The following factors support the patient status of observation.   " The patient's presenting symptoms include falls, hallucinations. " The physical exam findings include suprapubic tenderness. " The initial radiographic and laboratory data are stable other than urinalysis with leukocytes white cells and bacteria.   Synetta Fail MD [facility]  How to contact the [facility] Attending or Consulting provider 7A - 7P or covering provider during after hours 7P -7A, for this patient?   1. Check the care team in [facility] and look for a) attending/consulting [facility] provider listed and b) the [facility] team listed 2. Log into [email or web address] and use [facility]'s universal password to access. If you do not have the password, please contact the hospital operator. 3. Locate the [facility] provider you are looking for under [facility] and page to a number that you can be directly reached. 4. If you still have difficulty reaching the provider, please page the [facility] (Director on Call) for the Hospitalists listed on amion for assistance.  09/13/2020, 8:58 PM

## 2020-09-13 NOTE — ED Notes (Signed)
MRI spoke with patient's mother, legal guardian, and she informed them that pt will need medication to help him relax for MRI

## 2020-09-13 NOTE — ED Provider Notes (Signed)
Pt needs MRI of thoracic spine.  Neuro evaluate pt and doesn't think this is an MS flare but can't r/o without result of imaging.  Neuro Recommend psych to evaluate pt to assess if this is psych med induce.    Will consult medicine for admission.  Neuro think pt need repeat MRI.  Pt can't walk.  Has UTI, treat with Rocephin currently.    Unable to ambulate x 1 week.  May need PT/OT.   8:37 PM  Appreciate consultation with [facility] hospitalist who agrees to admit patient for further management of his condition.  He will likely benefit from MRI of the thoracic spine as well as PT and OT along with psychiatry recommendation.  BP (!) 127/58 (BP Location: Right Arm)    Pulse 95    Temp 98.3 F (36.8 C) (Oral)    Resp 18    SpO2 98%   Results for orders placed or performed during the hospital encounter of 09/13/20  Comprehensive metabolic panel  Result Value Ref Range   Sodium 138 135 - 145 mmol/L   Potassium 4.3 3.5 - 5.1 mmol/L   Chloride 107 98 - 111 mmol/L   CO2 23 22 - 32 mmol/L   Glucose, Bld 97 70 - 99 mg/dL   BUN 16 6 - 20 mg/dL   Creatinine, Ser 0.09 0.61 - 1.24 mg/dL   Calcium 9.2 8.9 - 38.1 mg/dL   Total Protein 7.4 6.5 - 8.1 g/dL   Albumin 4.8 3.5 - 5.0 g/dL   AST 16 15 - 41 U/L   ALT 33 0 - 44 U/L   Alkaline Phosphatase 84 38 - 126 U/L   Total Bilirubin 0.7 0.3 - 1.2 mg/dL   GFR, Estimated >82 >99 mL/min   Anion gap 8 5 - 15  Ethanol  Result Value Ref Range   Alcohol, Ethyl (B) <10 <10 mg/dL  Urine rapid drug screen (hosp performed)  Result Value Ref Range   Opiates NONE DETECTED NONE DETECTED   Cocaine NONE DETECTED NONE DETECTED   Benzodiazepines NONE DETECTED NONE DETECTED   Amphetamines NONE DETECTED NONE DETECTED   Tetrahydrocannabinol NONE DETECTED NONE DETECTED   Barbiturates NONE DETECTED NONE DETECTED  CBC with Diff  Result Value Ref Range   WBC 5.8 4.0 - 10.5 K/uL   RBC 5.10 4.22 - 5.81 MIL/uL   Hemoglobin 13.1 13.0 - 17.0 g/dL   HCT 37.1 69.6 - 78.9 %    MCV 83.3 80.0 - 100.0 fL   MCH 25.7 (L) 26.0 - 34.0 pg   MCHC 30.8 30.0 - 36.0 g/dL   RDW 38.1 01.7 - 51.0 %   Platelets 189 150 - 400 K/uL   nRBC 0.0 0.0 - 0.2 %   Neutrophils Relative % 64 %   Neutro Abs 3.7 1.7 - 7.7 K/uL   Lymphocytes Relative 23 %   Lymphs Abs 1.3 0.7 - 4.0 K/uL   Monocytes Relative 9 %   Monocytes Absolute 0.5 0.1 - 1.0 K/uL   Eosinophils Relative 3 %   Eosinophils Absolute 0.2 0.0 - 0.5 K/uL   Basophils Relative 1 %   Basophils Absolute 0.0 0.0 - 0.1 K/uL   Immature Granulocytes 0 %   Abs Immature Granulocytes 0.02 0.00 - 0.07 K/uL  Acetaminophen level  Result Value Ref Range   Acetaminophen (Tylenol), Serum <10 (L) 10 - 30 ug/mL  Salicylate level  Result Value Ref Range   Salicylate Lvl <7.0 (L) 7.0 - 30.0 mg/dL  Urinalysis, Routine  w reflex microscopic  Result Value Ref Range   Color, Urine YELLOW YELLOW   APPearance HAZY (A) CLEAR   Specific Gravity, Urine 1.017 1.005 - 1.030   pH 7.0 5.0 - 8.0   Glucose, UA NEGATIVE NEGATIVE mg/dL   Hgb urine dipstick NEGATIVE NEGATIVE   Bilirubin Urine NEGATIVE NEGATIVE   Ketones, ur NEGATIVE NEGATIVE mg/dL   Protein, ur NEGATIVE NEGATIVE mg/dL   Nitrite NEGATIVE NEGATIVE   Leukocytes,Ua LARGE (A) NEGATIVE   RBC / HPF 6-10 0 - 5 RBC/hpf   WBC, UA >50 (H) 0 - 5 WBC/hpf   Bacteria, UA MANY (A) NONE SEEN   DG Ankle Complete Right  Result Date: 09/13/2020 CLINICAL DATA:  30 year old male with pain.  No known injury. EXAM: RIGHT ANKLE - COMPLETE 3+ VIEW COMPARISON:  None. FINDINGS: Bone mineralization is within normal limits. There is no evidence of fracture, dislocation, or joint effusion. There is no evidence of arthropathy or other focal bone abnormality. Soft tissues are unremarkable. IMPRESSION: Negative. Electronically Signed   By: Odessa Fleming M.D.   On: 09/13/2020 10:19   CT Head Wo Contrast  Result Date: 09/13/2020 CLINICAL DATA:  30 year old male status post fall 2 days ago. Altered mental status. History of  multiple sclerosis. EXAM: CT HEAD WITHOUT CONTRAST TECHNIQUE: Contiguous axial images were obtained from the base of the skull through the vertex without intravenous contrast. COMPARISON:  Brain MRI 07/17/2020 the, head CT 07/16/2020. FINDINGS: Brain: Stable cerebral volume, ex vacuo ventricular enlargement. Patchy and confluent white matter hypodensity greater in the right hemisphere appears stable. No superimposed midline shift, ventriculomegaly, mass effect, evidence of mass lesion, intracranial hemorrhage or evidence of cortically based acute infarction. Vascular: No suspicious intracranial vascular hyperdensity. Skull: No acute osseous abnormality identified. Sinuses/Orbits: Sinuses, tympanic cavities and mastoids are hyperplastic and clear. Other: Similar leftward gaze to the prior CT. No acute orbit or scalp soft tissue finding. IMPRESSION: 1. No acute intracranial abnormality or acute traumatic injury identified. 2. Stable advanced white matter disease and associated cerebral volume loss Electronically Signed   By: Odessa Fleming M.D.   On: 09/13/2020 10:25   MR BRAIN WO CONTRAST  Result Date: 09/13/2020 CLINICAL DATA:  Multiple sclerosis EXAM: MRI HEAD WITHOUT CONTRAST TECHNIQUE: Multiplanar, multiecho pulse sequences of the brain and surrounding structures were obtained without intravenous contrast. COMPARISON:  None. FINDINGS: Motion artifact is present. Brain: Patchy and confluent areas of T2 hyperintensity are again identified in the supratentorial periventricular greater than subcortical white matter. Burden does not appear to changed. Involvement of the posterior fossa is less apparent on this study likely on a technical basis. Prominence of the ventricles and sulci reflects stable parenchymal volume loss. Unchanged thinning of the corpus callosum. There is no acute infarction or intracranial hemorrhage. There is no intracranial mass, mass effect, or edema. There is no hydrocephalus or extra-axial fluid  collection. Vascular: Major vessel flow voids at the skull base are preserved. Skull and upper cervical spine: Normal marrow signal is preserved. Sinuses/Orbits: Paranasal sinuses are aerated. Orbits are unremarkable. Other: Sella is unremarkable.  Mastoid air cells are clear. IMPRESSION: No apparent change in burden of chronic demyelination. Stable parenchymal volume loss. Electronically Signed   By: Guadlupe Spanish M.D.   On: 09/13/2020 14:13   MR CERVICAL SPINE WO CONTRAST  Result Date: 09/13/2020 CLINICAL DATA:  Multiple sclerosis EXAM: MRI CERVICAL SPINE WITHOUT CONTRAST TECHNIQUE: Multiplanar, multisequence MR imaging of the cervical spine was performed. No intravenous contrast was administered. COMPARISON:  07/17/2020 FINDINGS: Motion degraded. Alignment: Unchanged. Vertebrae: Vertebral body heights are stable. No apparent marrow edema. Cord: Poor evaluation of the cord due to artifact. T2 hyperintensity appears to be present at the C4 and C5 levels as before. No definite new abnormality. Posterior Fossa, vertebral arteries, paraspinal tissues: Brain reported separately. Otherwise no abnormality within above limitation. Disc levels: No degenerative stenosis identified. IMPRESSION: Significantly motion degraded with poor evaluation of the cord. Chronic demyelination at C4 and C5 is again identified. No definite new abnormality. Electronically Signed   By: Guadlupe Spanish M.D.   On: 09/13/2020 14:19   DG Chest Port 1 View  Result Date: 09/13/2020 CLINICAL DATA:  Rule out infection EXAM: PORTABLE CHEST 1 VIEW COMPARISON:  07/16/2020 FINDINGS: The heart size and mediastinal contours are within normal limits. Both lungs are clear. The visualized skeletal structures are unremarkable. IMPRESSION: No active disease. Electronically Signed   By: Marlan Palau M.D.   On: 09/13/2020 11:09   DG Foot Complete Right  Result Date: 09/13/2020 CLINICAL DATA:  30 year old male with pain. No known injury. EXAM: RIGHT  FOOT COMPLETE - 3+ VIEW COMPARISON:  Right ankle series today. FINDINGS: Bone mineralization is within normal limits. There is no evidence of fracture or dislocation. There is no evidence of arthropathy or other focal bone abnormality. Soft tissues are unremarkable. IMPRESSION: Negative. Electronically Signed   By: Odessa Fleming M.D.   On: 09/13/2020 10:20   DG Hip Unilat With Pelvis 2-3 Views Left  Result Date: 09/13/2020 CLINICAL DATA:  30 year old male with pain. No known injury. EXAM: DG HIP (WITH OR WITHOUT PELVIS) 2-3V LEFT COMPARISON:  Left hip series 04/19/2016. FINDINGS: Bone mineralization is within normal limits. Hip joint spaces appear stable and preserved. There is no evidence of hip fracture or dislocation. There is no evidence of arthropathy or other focal bone abnormality. SI joints appear normal. Stable, negative lower abdominal and pelvic visceral contours. IMPRESSION: Negative. Electronically Signed   By: Odessa Fleming M.D.   On: 09/13/2020 10:21      Fayrene Helper, PA-C 09/13/20 [id number]    Milagros Loll, MD 09/14/20 [phone]

## 2020-09-13 NOTE — ED Notes (Signed)
TTS in progress 

## 2020-09-13 NOTE — ED Notes (Signed)
MD at bedside.  Patient laughing uncontrollably with slurred speech.  Laughter not appropriate for context of conversation.

## 2020-09-13 NOTE — ED Notes (Signed)
Pt ambulated by EMT DJ.  Able to stand, but difficulty walking.

## 2020-09-13 NOTE — ED Notes (Signed)
Pt transported to MRI 

## 2020-09-13 NOTE — BH Assessment (Signed)
Comprehensive Clinical Assessment (CCA) Note  09/13/2020 Brandon [name] [id number]  Disposition: Cecilio Asper, NP recommends inpatient treatment when pt is medically cleared (pt going on medical floor.) Disposition discussed with Brandon Helper, PA-C and Brandon Pacific, RN. PA to consult psychiatry to assess pt's medications.    Flowsheet Row ED from 09/13/2020 in [facility] from 05/03/2020 in [facility] ED from 04/03/2020 in [facility]  C-SSRS RISK CATEGORY No Risk No Risk No Risk     No suicide risk.   The patient demonstrates the following risk factors for suicide: Chronic risk factors for suicide include: psychiatric disorder of disorganized schizophrenia. Acute risk factors for suicide include: family or marital conflict. Protective factors for this patient include: positive social support. Considering these factors, the overall suicide risk at this point appears to be low. Patient is not appropriate for outpatient follow up.  Brandon [name] is a 30 year old male who presents voluntary and unaccompanied to [facility]. Clinician asked the pt, "what brought you to the hospital?" Clinician observed the pt laughing hysterically. Clinician asked the pt again. Pt calmed down and replied, "because I'm falling for six weeks." Pt reported, he was trying to change a trash bag, he fell and hit his head on his mother's wheelchair. Pt reported, hearing voices, pt did not disclose what his voices say. Pt reported, he's licensed to carry a weapon but he does not have a weapon. Pt denies, SI, HI and self-injurious behaviors.   Pt denies substance use. Pt's UDS is pending. Pt is linked to Dr. Evelene Croon for medication management at [facility].   Pt presents alert sitting in hospital bed under covers with soft and loud speech. Pt's eye contact was fleeting. Pt's thought content was circumstantial. Pt's mood was euthymic. Pt's affect was  full range. Pt's insight is lacking. Pt's judgement is impaired.   Clinician called pt's mother/legal guardian Brandon [name], [phone]) to obtain additional information. Pt's mother reports, pt has been falling since January 2022, she's called the non emergency line for paramedics to come out to check on the pt. Per mother, when the pt fell and hit his head there was some swelling. Pt's mother reported, the pt told the PA the voices are telling him to hurt himself. Pt's mother reported, the hits (with a fist) his head twice a week due to hearing multiple voices.   Diagnosis: Disorganized schizophrenia [facility])  Chief Complaint:  Chief Complaint  Patient presents with  . Foot Pain   Visit Diagnosis:   CCA Screening, Triage and Referral (STR)  Patient Reported Information How did you hear about [place]? Family/Friend  Referral name: Brandon [name]  Referral phone number: [phone]   Whom do you see for routine medical problems? -- ([facility])  Practice/Facility Name: [facility]  Practice/Facility Phone Number: No data recorded Name of Contact: No data recorded Contact Number: No data recorded Contact Fax Number: No data recorded Prescriber Name: No data recorded Prescriber Address (if known): No data recorded  What Is the Reason for Your Visit/Call Today? Pt frequently falling AVH.  How Long Has This Been Causing You Problems? -- (UTA)  What Do You Feel Would Help You the Most Today? Assessment Only   Have You Recently Been in Any Inpatient Treatment (Hospital/Detox/Crisis Center/28-Day Program)? No  Name/Location of Program/Hospital:No data recorded How Long Were You There? No data recorded When Were You Discharged? No data recorded  Have You Ever Received Services From [facility]  Before? Yes  Who Do You See at [facility]? Pt seen is psychiatrist (Dr. Evelene Croon) in 08/2020.   Have You Recently Had Any Thoughts About Hurting Yourself? No  Are You Planning to Commit Suicide/Harm  Yourself At This time? No   Have you Recently Had Thoughts About Hurting Someone [name]? No  Explanation: No data recorded  Have You Used Any Alcohol or Drugs in the Past 24 Hours? No  How Long Ago Did You Use Drugs or Alcohol? No data recorded What Did You Use and How Much? No data recorded  Do You Currently Have a Therapist/Psychiatrist? Yes  Name of Therapist/Psychiatrist: Dr. Evelene Croon at [facility].   Have You Been Recently Discharged From Any Office Practice or Programs? No  Explanation of Discharge From Practice/Program: Graduated [facility] [facility]     CCA Screening Triage Referral Assessment Type of Contact: Tele-Assessment  Is this Initial or Reassessment? Initial Assessment  Date Telepsych consult ordered in [facility]:  09/13/2020  Time Telepsych consult ordered in [facility]:  1326   Patient Reported Information Reviewed? Yes  Patient Left Without Being Seen? No data recorded Reason for Not Completing Assessment: No data recorded  Collateral Involvement: Clinic[facility] communicated with pt's mother/legal guardian Brandon [name], [phone])   Does Patient Have a Court Appointed Legal Guardian? No data recorded Name and Contact of Legal Guardian: No data recorded If Minor and Not Living with Parent(s), Who has Custody? N/A  Is CPS involved or ever been involved? -- (UTA)  Is APS involved or ever been involved? -- (UTA)   Patient Determined To Be At Risk for Harm To Self or Others Based on Review of Patient Reported Information or Presenting Complaint? No  Method: No data recorded Availability of Means: No data recorded Intent: No data recorded Notification Required: No data recorded Additional Information for Danger to Others Potential: No data recorded Additional Comments for Danger to Others Potential: No data recorded Are There Guns or Other Weapons in Your Home? No data recorded Types of Guns/Weapons: No data recorded Are These Weapons Safely Secured?                             No data recorded Who Could Verify You Are Able To Have These Secured: No data recorded Do You Have any Outstanding Charges, Pending Court Dates, Parole/Probation? No data recorded Contacted To Inform of Risk of Harm To Self or Others: -- (N/A)   Location of Assessment: [facility] ED   Does Patient Present under Involuntary Commitment? No  IVC Papers Initial File Date: No data recorded  [place] of Residence: [place]   Patient Currently Receiving the Following Services: Medication Management   Determination of Need: Emergent (2 hours)   Options For Referral: Medication Management; Inpatient Hospitalization; Outpatient Therapy; [facility] Urgent Care     CCA Biopsychosocial Intake/Chief Complaint:  Per EDP/PA note: "is a 30 y.o. male with PMHx HTN, Bipolar disorder, Schizophrenia, Multiple sclerosis, CVA who presents to the ED via EMS for foot pain/behavioral issue. Per EMS - mom reports pt has been hearing voices telling him to hurt himself. Pt complaining of right foot and left hip pain. His mom reported to EMS that the slurred speech he has is baseline for him. When speaking to patient, he says that he is not hearing voices and has no SI/HI. Additional information obtained by mother Brandon [name] via phone - reports that over the past several days pt has been falling quite frequently s/2 voices  telling him that he cannot walk. She is unaware of voices telling pt to hurt himself. She states that the paramedics have come out to the home several times to check on patient however he continues to fall. She states that whenever the voices stop pt will get up and walk without difficulty. She does mention that he fell 2 days ago and hit his head on her wheelchair and had some bleeding from a wound; EMS came and evaluated pt however he was not transferred to the ED. Pt is on Eliquis for PE. Mom denies any worsening speech changes, confusion, worsening unilateral weakness (pt has left sided deficit s/2 MS), or any other  new/concerning symptoms. She wanted a "professional" to tell patient that he can actually walk and that the voices are incorrect."  Current Symptoms/Problems: AVH, frequently falling at home since January [id number].   Patient Reported Schizophrenia/Schizoaffective Diagnosis in Past: Yes   Strengths: Not assessed.  Preferences: Not assessed.  Abilities: Not assessed.   Type of Services Patient Feels are Needed: Not assessed.   Initial Clinical Notes/Concerns: N/A   Mental Health Symptoms Depression:  Difficulty Concentrating; Tearfulness; Sleep (too much or little); Increase/decrease in appetite   Duration of Depressive symptoms: Less than two weeks   Mania:  Racing thoughts   Anxiety:   None   Psychosis:  Hallucinations   Duration of Psychotic symptoms: Greater than six months   Trauma:  None   Obsessions:  None   Compulsions:  None   Inattention:  None   Hyperactivity/Impulsivity:  N/A   Oppositional/Defiant Behaviors:  Argumentative   Emotional Irregularity:  Intense/inappropriate anger; Mood lability; Potentially harmful impulsivity   Other Mood/Personality Symptoms:  N/A    Mental Status Exam Appearance and self-care  Stature:  Tall   Weight:  Average weight   Clothing:  -- (Pt in the bed under covers.)   Grooming:  Normal   Cosmetic use:  None   Posture/gait:  Normal   Motor activity:  Not Remarkable   Sensorium  Attention:  Confused; Distractible   Concentration:  Scattered   Orientation:  Person   Recall/memory:  Defective in Immediate; Defective in Short-term   Affect and Mood  Affect:  Full Range   Mood:  Irritable   Relating  Eye contact:  Fleeting   Facial expression:  Responsive   Attitude toward examiner:  Guarded   Thought and Language  Speech flow: Garbled   Thought content:  Appropriate to Mood and Circumstances   Preoccupation:  None   Hallucinations:  Auditory   Organization:  No data recorded  Dyne[employer]utive  Functions  Fund of Knowledge:  Poor   Intelligence:  Needs investigation   Abstraction:  Abstract   Judgement:  Impaired   Reality Testing:  Distorted   Insight:  Gaps   Decision Making:  Impulsive   Social Functioning  Social Maturity:  Impulsive   Social Judgement:  Heedless   Stress  Stressors:  -- Industrial/product designer(UTA)   Coping Ability:  Exhausted   Skill Deficits:  Activities of daily living; Decision making; Self-control   Supports:  Family     Religion: Religion/Spirituality Are You A Religious Person?: Yes What is Your Religious Affiliation?: ChiropodistChristian  Leisure/Recreation: Leisure / Recreation Do You Have Hobbies?:  (UTA)  Exercise/Diet: Exercise/Diet Do You Exercise?:  (UTA) Have You Gained or Lost A Significant Amount of Weight in the Past Six Months?:  (UTA) Do You Follow a Special Diet?:  (UTA) Do You Have Any Trouble  Sleeping?: Yes Explanation of Sleeping Difficulties: Pt reported, "I don't get sleep."   CCA Employment/Education Employment/Work Situation: Employment / Work Situation Employment situation: On disability Why is patient on disability: Not assessed. How long has patient been on disability: Not assessed. Patient's job has been impacted by current illness:  (Not assessed.) What is the longest time patient has a held a job?: Not assessed. Where was the patient employed at that time?: Not assessed. Has patient ever been in the [employer]?: No (Pt reported, he was in the [place] branch of the [employer]. 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 Per mother, the pt is not in school.) Last Grade Completed:  (N/A) Name of High School: N/A Did You Graduate From [employer]?: Yes Did You Attend College?:  (UTA) Did You Attend Graduate School?:  (UTA) Did You Have Any Special Interests In  School?: UTA   CCA Family/Childhood History Family and Relationship History: Family history Marital status: Single Are you sexually active?:  (Not assessed.) What is your sexual orientation?: Not assessed. Has your sexual activity been affected by drugs, alcohol, medication, or emotional stress?: Not assessed. Does patient have children?:  (Pt reported, having too many children. Per mother, the pt has not children.)  Childhood History:  Childhood History By whom was/is the patient raised?:  (Not assessed.) Additional childhood history information: Not assessed. Description of patient's relationship with caregiver when they were a child: Not assessed. Patient's description of current relationship with people who raised him/her: Not assessed. How were you disciplined when you got in trouble as a child/adolescent?: Not assessed. Does patient have siblings?:  (UTA) Did patient suffer any verbal/emotional/physical/sexual abuse as a child?:  (UTA) Did patient suffer from severe childhood neglect?:  (UTA) Has patient ever been sexually abused/assaulted/raped as an adolescent or adult?:  (UTA) Was the patient ever a victim of a crime or a disaster?:  (UTA) Witnessed domestic violence?:  (UTA)  Child/Adolescent Assessment:     CCA Substance Use Alcohol/Drug Use: Alcohol / Drug Use Pain Medications: See MAR Prescriptions: See MAR Over the Counter: See MAR History of alcohol / drug use?: No history of alcohol / drug abuse (Pt denies.)    ASAM's:  Six Dimensions of Multidimensional Assessment  Dimension 1:  Acute Intoxication and/or Withdrawal Potential:      Dimension 2:  Biomedical Conditions and Complications:      Dimension 3:  Emotional, Behavioral, or Cognitive Conditions and Complications:     Dimension 4:  Readiness to Change:     Dimension 5:  Relapse, Continued use, or Continued Problem Potential:     Dimension 6:  Recovery/Living Environment:     ASAM Severity Score:     ASAM Recommended Level of Treatment:     Substance use Disorder (SUD)    Recommendations for Services/Supports/Treatments: Recommendations for Services/Supports/Treatments Recommendations For Services/Supports/Treatments: Inpatient Hospitalization  DSM5 Diagnoses: Patient Active Problem List   Diagnosis Date Noted  . Falls 09/13/2020  . COVID-19 virus infection 07/17/2020  . COVID-19 07/17/2020  . Unspecified intellectual disabilities 01/27/2020  . Migraine headache 04/18/2019  . Generalized weakness   . Pulmonary embolism (HCC) 08/12/2017  . Hyperglycemia 07/31/2017  . Multiple sclerosis exacerbation (HCC) 07/30/2017  . Acute encephalopathy 07/30/2017  . Schizoaffective disorder, bipolar type (HCC)   . Disorganized schizophrenia (HCC)   . Hallucinations 08/05/2014  . Auditory  hallucination   . Left-sided weakness 01/18/2014  . Multiple sclerosis (HCC) 05/20/2013  . 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 Abnormality of gait 11/23/2012  . HTN (hypertension) 07/04/2011  . 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 Obesity, Class III, BMI 40-49.9 (morbid obesity) (HCC) 01/14/2010  . ADHD 01/14/2010    Referrals to Alternative Service(s): Referred to Alternative Service(s):   Place:   Date:   Time:    Referred to Alternative Service(s):   Place:   Date:   Time:    Referred to Alternative Service(s):   Place:   Date:   Time:    Referred to Alternative Service(s):   Place:   Date:   Time:     Redmond Pulling, [facility]  Comprehensive Clinical Assessment (CCA) Screening, Triage and Referral Note  09/13/2020 AYANSH FEUTZ [id number]  Chief Complaint:  Chief Complaint  Patient presents with  . Foot Pain   Visit Diagnosis:   Patient Reported Information How did you hear about [place]? Family/Friend   Referral name: Brandon [name]   Referral phone number: [phone]  Whom do you see for routine medical problems? -- (UTA)   Practice/Facility Name: [facility]   Practice/Facility  Phone Number: No data recorded  Name of Contact: No data recorded  Contact Number: No data recorded  Contact Fax Number: No data recorded  Prescriber Name: No data recorded  Prescriber Address (if known): No data recorded What Is the Reason for Your Visit/Call Today? Pt frequently falling AVH.  How Long Has This Been Causing You Problems? -- (UTA)  Have You Recently Been in Any Inpatient Treatment (Hospital/Detox/Crisis Center/28-Day Program)? No   Name/Location of Program/Hospital:No data recorded  How Long Were You There? No data recorded  When Were You Discharged? No data recorded Have You Ever Received Services From [facility] Before? Yes   Who Do You See at [facility]? Pt seen is psychiatrist (Dr. Evelene Croon) in 08/2020.  Have You Recently Had Any Thoughts About Hurting Yourself? No   Are You Planning to Commit Suicide/Harm Yourself At This time?  No  Have you Recently Had Thoughts About Hurting Someone [name]? No   Explanation: No data recorded Have You Used Any Alcohol or Drugs in the Past 24 Hours? No   How Long Ago Did You Use Drugs or Alcohol?  No data recorded  What Did You Use and How Much? No data recorded What Do You Feel Would Help You the Most Today? Assessment Only  Do You Currently Have a Therapist/Psychiatrist? Yes   Name of Therapist/Psychiatrist: Dr. Evelene Croon at [facility].   Have You Been Recently Discharged From Any Office Practice or Programs? No   Explanation of Discharge From Practice/Program:  Graduated [facility]     CCA Screening Triage Referral Assessment Type of Contact: Tele-Assessment   Is this Initial or Reassessment? Initial Assessment   Date Telepsych consult ordered in [facility]:  09/13/2020   Time Telepsych consult ordered in [facility]:  1326  Patient Reported Information Reviewed? Yes   Patient Left Without Being Seen? No data recorded  Reason for Not Completing Assessment: No data recorded Collateral Involvement: Clinic[facility] communicated with pt's  mother/legal guardian Brandon [name], [phone])  Does Patient Have a Court Appointed Legal Guardian? No data recorded  Name and Contact of Legal Guardian:  No data recorded If Minor and Not Living with Parent(s), Who has Custody? N/A  Is CPS involved or ever been involved? -- (UTA)  Is APS involved or ever been involved? -- (  UTA)  Patient Determined To Be At Risk for Harm To Self or Others Based on Review of Patient Reported Information or Presenting Complaint? No   Method: No data recorded  Availability of Means: No data recorded  Intent: No data recorded  Notification Required: No data recorded  Additional Information for Danger to Others Potential:  No data recorded  Additional Comments for Danger to Others Potential:  No data recorded  Are There Guns or Other Weapons in Your Home?  No data recorded   Types of Guns/Weapons: No data recorded   Are These Weapons Safely Secured?                              No data recorded   Who Could Verify You Are Able To Have These Secured:    No data recorded Do You Have any Outstanding Charges, Pending Court Dates, Parole/Probation? No data recorded Contacted To Inform of Risk of Harm To Self or Others: -- (N/A)  [facility] of [facility]: [facility] ED  Does Patient Present under Involuntary Commitment? No   IVC Papers Initial File Date: No data recorded  [place] of Residence: [place]  Patient Currently Receiving the Following Services: Medication Management   Determination of Need: Emergent (2 hours)   Options For Referral: Medication Management; Inpatient Hospitalization; Outpatient Therapy; [facility]   Redmond Pulling, [facility]     Redmond Pulling, MS, [facility], [facility] Triage Specialist [phone]

## 2020-09-13 NOTE — Progress Notes (Signed)
Pt came to MRI, Ativan was given prior to scan. Once head without was obtained scan was stopped and reach out to PA and RN to requested more meds. Haldol was given and c-spine without was then obtained. Pt unable to hold still. Best obtainable images. Unable to continue due to motion degraded images. If more imaging is warranted pt may need conscience sedation to be about to get diagnostic imaging.

## 2020-09-13 NOTE — Consult Note (Addendum)
NEUROHOSPITALISTS-CONSULTATION NOTE    Requesting Physician: Dr. Myrtis Ser Admit date: 09/13/20 Chief Complaint: Difficulty walking  History obtained from:  Patient and Chart      HPI   Brandon DIGUGLIELMO is a 30 year old male w/pmh of HTN, Bipolar disorder, Schizophrenia, relapsing remitting multiple sclerosis on Ocrevus, CVA who presents with concern for MS flare due to an increase in falls over the past few days.The patients mother was called to assist with the history taking due to the patient being a poor historian.   She states that this past week he had three falls for which she had to call EMS for assistance. He fell on Monday, Tuesday and today. Of note when she states "falls" she clarifies that these are assisted falls in which the patient is able to support himself to sit down. He will stay down until he feels he is strong enough to get back up.   He has had assisted falls previously; last year per mother he had assisted falls every 4-5 months. She feels like the frequency of his assisted falls has now increased and notes assisted falls in January, February and now this month.   A new symptom that she also notes is urinary incontinence that occurred yesterday and Wednesday of this week. Per mom he had 4 episodes of urinary incontinence yesterday and 3 episodes on Wednesday.   She also notes that he has been "shaking" when asked to describe the shaking in more detail she then states he has been having spasms to both of his legs and left arm. When the spasms occur the patient has told his mom that he hears voices in his head telling him that he is paralyzed and disabled.   Last seen in MS clinic on 05/01/20 by Margie Ege, NP- MS deemed to be overall stable at the time. Recently hospitalized for possible MS flare in January of this year- imaging at the time was unremarkable for new demyelinating lesions.    Past Medical History    Past Medical History:  Diagnosis Date  .  ADHD (attention deficit hyperactivity disorder)   . Bipolar 1 disorder (HCC)   . Chronic back pain   . Chronic constipation   . Chronic neck pain   . Hypertension   . Multiple sclerosis (HCC) 05/20/2013   left sided weakness, dysarthria  . Non-compliance   . Obesity   . Pulmonary embolism (HCC)   . Schizophrenia (HCC)   . Stroke [facility])    left sided deficits - pt's mother denies this     Past Surgical History   Past Surgical History:  Procedure Laterality Date  . NO PAST SURGERIES    . None    . TOOTH EXTRACTION N/A 06/24/2019   Procedure: DENTAL RESTORATION/EXTRACTION OF TEETH NUMBER ONE, SIXTEEN, SEVENTEEN, NINETEEN, THIRTY-TWO;  Surgeon: Ocie Doyne, DDS;  Location: [facility] OR;  Service: Oral Surgery;  Laterality: N/A;     Family History   Family History  Problem Relation Age of Onset  . Diabetes Mother   . ADD / ADHD Brother     Social History  Social History:  reports that he has been smoking cigarettes. He has been smoking about 0.25 packs per day. He has never used smokeless tobacco. He reports previous alcohol use. He reports previous drug use. Drug: Marijuana.   Allergies  Allergies: No Known Allergies   Medications Prior to Admission    I have reviewed the patient's current medications.   ROS  14 point  review of systems obtained and is negative aside from HPI    Physical Examination   Constitutional: Appears well-developed and well-nourished, obese Psych: Affect and appropriate.  At times looks around blankly.  At times looks suspicious.  At times laughs uncontrollably.  Intermittently cooperative. Eyes: No scleral injection HENT: No OP obstruction.  Poor oral hygiene. MSK: no joint deformities.  Cardiovascular: Perfusing extremities well with a regular rate on palpation of pulses Respiratory: Effort normal, non-labored breathing GI: Soft.  No distension. There is no tenderness.  Skin: Warm dry and intact visible skin   Neurological  Examination Mental Status: AAOx4, follows commands to show two fingers gives thumbs up.  Affect: Mostly pleasant however blank staring and giggling while examined  Speech: Fluent with repetition and naming intact Cranial Nerves: EOMI, VFF, Decreased right nasolabial fold, Tongue midline  Motor: Normal bulk and mildly spastic tone with some paratonia as well. Strength 5/5 throughout   Sensory: Intact to light touch and vibration throughout  Deep Tendon Reflexes: 3+ symmetric throughout, toes down going bilaterally  Cerebellar: Intact FNF  Gait: Stood patient at bedside however asked him to sit back down due to him becoming dizzy upon standing  On attending evaluation, he was able to rise somewhat with astasia abasia (supporting himself in a semicrouched position).  Took short shuffling steps.   Pertinent Imaging/Labwork    09/13/20 MR Cervical Spine WO Contrast Significantly motion degraded with poor evaluation of the cord. Chronic demyelination at C4 and C5 is again identified. No definite new abnormality.  09/13/20 MR Brain WO Contrast  No apparent change in burden of chronic demyelination. Stableparenchymal volume loss.  UA notable for large leukocytes, negative nitrites, many bacteria, with culture pending   Assessment and Plan   Brandon [name] is a 30 year old male w/pmh of HTN, Bipolar disorder, Schizophrenia, relapsing remitting multiple sclerosis on Ocrevus,CVA who presents with increased frequency of assisted falls in the last week, concerning for a possible MS flare.   It is reassuring that his current physical examination(please view above) is consistent with his examination when he was last seen in MS clinic on May 01 2020. His physical examination is also notable for inappropriate affect- laughing during the examination and blank staring. His mother reports that he has been hearing voices telling him he is disabled/paralyzed- concerning for underlying behavioral component of  presenting symptoms.  Suspect he may have a pseudoflare in the setting of UTI.  Unfortunately difficult to rule out multiple sclerosis flare in the setting of his behavioral challenges at this time.  Urinary incontinence could be due to neurogenic bladder in the setting of MS versus UTI noted this admission. MRI C Spine and Brain were obtained however without contrast due to patient inability to sit still- unable to evaluate for new demyelinating lesions at this time given lack of contrast   Recommendations  - MRI brain, C and T-spine w and w/o attempted on neurology recommendation, unfortunately non-diagnostic give patient's psychiatric status - Consult inpatient psychiatry for recommendations/optimization of psych regimen for bipolar/schizophrenia disorders  - Continue to monitor fever curve, infectious work-up including UA and chest x-ray completed on neurology recommendations - If patient continues to have difficulty ambulating after behavioral issues are addressed would then reorder MRI Brain and C Spine W WO Contrast. Consult neurology if there are new demyelinating lesions for further recommendations.   Neurology to sign off, thank you for consulting. Please reach out to neurology via Amion with any questions   Stark Jock,  NP  [facility]  Patient discussed with attending physician Dr. Iver Nestle   Attending Neurologist's note:  I personally saw this patient, gathering history, performing a full neurologic examination, reviewing relevant labs, personally reviewing relevant imaging including MRI brain and C-spine (motion limited), and formulated the assessment and plan, adding the note above for completeness and clarity to accurately reflect my thoughts  Brooke Dare MD-PhD [facility] [phone] Available 7 AM to 7 PM, outside these hours please contact Neurologist on call listed on AMION

## 2020-09-13 NOTE — ED Provider Notes (Signed)
[facility]-EMERGENCY DEPT Provider Note   CSN: [id number] Arrival date & time: 09/13/20  4098     History Chief Complaint  Patient presents with  . Foot Pain    Brandon [name] is a 30 y.o. male with PMHx HTN, Bipolar disorder, Schizophrenia, Multiple sclerosis, CVA who presents to the ED via EMS for foot pain/behavioral issue.   Per EMS - mom reports pt has been hearing voices telling him to hurt himself. Pt complaining of right foot and left hip pain. His mom reported to EMS that the slurred speech he has is baseline for him. When speaking to patient, he says that he is not hearing voices and has no SI/HI.   Additional information obtained by mother [name] via phone - reports that over the past several days pt has been falling quite frequently s/2 voices telling him that he cannot walk. She is unaware of voices telling pt to hurt himself. She states that the paramedics have come out to the home several times to check on patient however he continues to fall. She states that whenever the voices stop pt will get up and walk without difficulty. She does mention that he fell 2 days ago and hit his head on her wheelchair and had some bleeding from a wound; EMS came and evaluated pt however he was not transferred to the ED. Pt is on Eliquis for PE. Mom denies any worsening speech changes, confusion, worsening unilateral weakness (pt has left sided deficit s/2 MS), or any other new/concerning symptoms. She wanted a "professional" to tell patient that he can actually walk and that the voices are incorrect.   The history is provided by the patient, the EMS personnel, a parent and medical records.       Past Medical History:  Diagnosis Date  . ADHD (attention deficit hyperactivity disorder)   . Bipolar 1 disorder (HCC)   . Chronic back pain   . Chronic constipation   . Chronic neck pain   . Hypertension   . Multiple sclerosis (HCC) 05/20/2013   left sided weakness,  dysarthria  . Non-compliance   . Obesity   . Pulmonary embolism (HCC)   . Schizophrenia (HCC)   . Stroke [facility])    left sided deficits - pt's mother denies this    Patient Active Problem List   Diagnosis Date Noted  . COVID-19 virus infection 07/17/2020  . COVID-19 07/17/2020  . Unspecified intellectual disabilities 01/27/2020  . Migraine headache 04/18/2019  . Generalized weakness   . Pulmonary embolism (HCC) 08/12/2017  . Hyperglycemia 07/31/2017  . Multiple sclerosis exacerbation (HCC) 07/30/2017  . Acute encephalopathy 07/30/2017  . Schizoaffective disorder, bipolar type (HCC)   . Disorganized schizophrenia (HCC)   . Hallucinations 08/05/2014  . Auditory hallucination   . Left-sided weakness 01/18/2014  . Multiple sclerosis (HCC) 05/20/2013  . 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 Abnormality of gait 11/23/2012  . HTN (hypertension) 07/04/2011  . 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 Obesity, Class III, BMI 40-49.9 (morbid obesity) (HCC) 01/14/2010  . ADHD 01/14/2010    Past Surgical History:  Procedure Laterality Date  . NO PAST SURGERIES    . None    . TOOTH EXTRACTION N/A 06/24/2019   Procedure: DENTAL RESTORATION/EXTRACTION OF TEETH NUMBER ONE, SIXTEEN, SEVENTEEN, NINETEEN, THIRTY-TWO;  Surgeon: Ocie Doyne, DDS;  Location: [facility] OR;  Service: Oral Surgery;  Laterality: N/A;       Family History  Problem Relation Age of Onset  .  Diabetes Mother   . ADD / ADHD Brother     Social History   Tobacco Use  . Smoking status: Current Every Day Smoker    Packs/day: 0.25    Types: Cigarettes  . Smokeless tobacco: Never Used  . Tobacco comment: 2 cigarettes a day  Vaping Use  . Vaping Use: Never used  Substance Use Topics  . Alcohol use: Not Currently    Alcohol/week: 0.0 standard drinks    Comment: "A little bit"   . Drug use: Not Currently    Types: Marijuana    Comment: Last used: unknown     Home Medications Prior to Admission medications    Medication Sig Start Date End Date Taking? Authorizing Provider  acetaminophen (TYLENOL) 500 MG tablet Take 1 tablet (500 mg total) by mouth every 6 (six) hours as needed. Patient taking differently: Take 500 mg by mouth every 6 (six) hours as needed for moderate pain. 06/21/20   Fayrene Helper, PA-C  amLODipine (NORVASC) 10 MG tablet Take 10 mg by mouth daily.    [provider]  apixaban (ELIQUIS) 5 MG TABS tablet Take 5 mg by mouth 2 (two) times daily.     [provider]  Asenapine Maleate 10 MG SUBL Place 1 tablet (10 mg total) under the tongue in the morning, at noon, and at bedtime. 08/16/20   Zena Amos, MD  benztropine (COGENTIN) 1 MG tablet Take 1 tablet (1 mg total) by mouth 2 (two) times daily. 08/16/20   Zena Amos, MD  docusate sodium (COLACE) 100 MG capsule Take 400 mg by mouth at bedtime.     [provider]  haloperidol (HALDOL) 10 MG tablet Take 1 tablet (10 mg total) by mouth 2 (two) times daily. 08/16/20   Zena Amos, MD  haloperidol decanoate (HALDOL DECANOATE) 100 MG/ML injection Inject 2 mLs (200 mg total) into the muscle every 30 (thirty) days. 07/16/20   Zena Amos, MD  latanoprost (XALATAN) 0.005 % ophthalmic solution Place 1 drop into both eyes at bedtime.    [provider]  Melatonin 10 MG CAPS Take 10 mg by mouth at bedtime. 08/16/20   Zena Amos, MD  metFORMIN (GLUCOPHAGE-XR) 500 MG 24 hr tablet Take 500 mg by mouth daily with breakfast.    [provider]  Multiple Vitamin (MULTIVITAMIN WITH MINERALS) TABS tablet Take 1 tablet by mouth daily.    [provider]  ocrelizumab in sodium chloride 0.9 % 500 mL CHANGE ::  Ocrevus 600mg  IV EVERY 8 (EIGHT) months. 11/10/19   01/10/20, NP  topiramate (TOPAMAX) 25 MG tablet Take 1 tablet (25 mg total) by mouth 2 (two) times daily. 09/03/20   09/05/20, NP  traZODone (DESYREL) 100 MG tablet Take 1 to 2 tablets at bedtime as needed for sleep. 09/06/20   11/06/20, MD  propranolol (INDERAL) 10 MG tablet Take 1 tablet (10 mg total) by mouth 2 (two) times daily. 05/03/15 01/03/20  01/05/20, NP    Allergies    Patient has no known allergies.  Review of Systems   Review of Systems  Unable to perform ROS: Psychiatric disorder  Musculoskeletal: Positive for arthralgias.  Psychiatric/Behavioral: Positive for hallucinations.    Physical Exam Updated Vital Signs BP (!) 178/115 (BP Location: Left Arm)   Pulse 84   Temp 98.3 F (36.8 C) (Oral)   Resp 18   SpO2 100%   Physical Exam Vitals and nursing note reviewed.  Constitutional:  Appearance: He is obese. He is not ill-appearing or diaphoretic.  HENT:     Head: Normocephalic.     Comments: No wound appreciated Eyes:     Extraocular Movements: Extraocular movements intact.     Conjunctiva/sclera: Conjunctivae normal.     Pupils: Pupils are equal, round, and reactive to light.  Cardiovascular:     Rate and Rhythm: Normal rate and regular rhythm.     Pulses: Normal pulses.  Pulmonary:     Effort: Pulmonary effort is normal.     Breath sounds: Normal breath sounds. No wheezing, rhonchi or rales.  Abdominal:     Palpations: Abdomen is soft.     Tenderness: There is no abdominal tenderness. There is no guarding or rebound.  Musculoskeletal:     Cervical back: Normal range of motion and neck supple. No tenderness.     Comments: No overlying skin changes or swelling noted to R ankle/foot on inspection. + TTP to the lateral malleolus and dorsal aspect of the foot along the lateral aspect. ROM intact to ankle. Cap refill < 2 seconds. 2+ DP pulse.   + Mild TTP to the left hip. No deformity appreciated. ROM intact. Strength and sensation intact to LLE. 2+ DP pulse.   Skin:    General: Skin is warm and dry.  Neurological:     Mental Status: He is alert.     Comments: Alert and oriented to self, place, time and event.   Dysarthric speech (at baseline)   Strength 5/5 in  upper/lower extremities  Sensation intact in upper/lower extremities  Patellar reflexes 2+  Normal finger-to-nose and feet tapping.  CN I not tested  CN II grossly intact visual fields bilaterally. Did not visualize posterior eye.   CN III, IV, VI PERRLA and EOMs intact bilaterally  CN V Intact sensation to sharp and light touch to the face  CN VII facial movements symmetric  CN VIII not tested  CN IX, X no uvula deviation, symmetric rise of soft palate  CN XI 5/5 SCM and trapezius strength bilaterally  CN XII Midline tongue protrusion, symmetric L/R movements   Psychiatric:        Attention and Perception: He is inattentive.        Mood and Affect: Affect is flat.        Speech: Speech is delayed.        Behavior: Behavior is cooperative.     ED Results / Procedures / Treatments   Labs (all labs ordered are listed, but only abnormal results are displayed) Labs Reviewed  CBC WITH DIFFERENTIAL/PLATELET - Abnormal; Notable for the following components:      Result Value   MCH 25.7 (*)    All other components within normal limits  ACETAMINOPHEN LEVEL - Abnormal; Notable for the following components:   Acetaminophen (Tylenol), Serum <10 (*)    All other components within normal limits  SALICYLATE LEVEL - Abnormal; Notable for the following components:   Salicylate Lvl <7.0 (*)    All other components within normal limits  URINALYSIS, ROUTINE W REFLEX MICROSCOPIC - Abnormal; Notable for the following components:   APPearance HAZY (*)    Leukocytes,Ua LARGE (*)    WBC, UA >50 (*)    Bacteria, UA MANY (*)    All other components within normal limits  URINE CULTURE  COMPREHENSIVE METABOLIC PANEL  ETHANOL  RAPID URINE DRUG SCREEN, HOSP PERFORMED    EKG None  Radiology DG Ankle Complete Right  Result Date:  09/13/2020 CLINICAL DATA:  30 year old male with pain.  No known injury. EXAM: RIGHT ANKLE - COMPLETE 3+ VIEW COMPARISON:  None. FINDINGS: Bone mineralization is within  normal limits. There is no evidence of fracture, dislocation, or joint effusion. There is no evidence of arthropathy or other focal bone abnormality. Soft tissues are unremarkable. IMPRESSION: Negative. Electronically Signed   By: Odessa Fleming M.D.   On: 09/13/2020 10:19   CT Head Wo Contrast  Result Date: 09/13/2020 CLINICAL DATA:  30 year old male status post fall 2 days ago. Altered mental status. History of multiple sclerosis. EXAM: CT HEAD WITHOUT CONTRAST TECHNIQUE: Contiguous axial images were obtained from the base of the skull through the vertex without intravenous contrast. COMPARISON:  Brain MRI 07/17/2020 the, head CT 07/16/2020. FINDINGS: Brain: Stable cerebral volume, ex vacuo ventricular enlargement. Patchy and confluent white matter hypodensity greater in the right hemisphere appears stable. No superimposed midline shift, ventriculomegaly, mass effect, evidence of mass lesion, intracranial hemorrhage or evidence of cortically based acute infarction. Vascular: No suspicious intracranial vascular hyperdensity. Skull: No acute osseous abnormality identified. Sinuses/Orbits: Sinuses, tympanic cavities and mastoids are hyperplastic and clear. Other: Similar leftward gaze to the prior CT. No acute orbit or scalp soft tissue finding. IMPRESSION: 1. No acute intracranial abnormality or acute traumatic injury identified. 2. Stable advanced white matter disease and associated cerebral volume loss Electronically Signed   By: Odessa Fleming M.D.   On: 09/13/2020 10:25   MR BRAIN WO CONTRAST  Result Date: 09/13/2020 CLINICAL DATA:  Multiple sclerosis EXAM: MRI HEAD WITHOUT CONTRAST TECHNIQUE: Multiplanar, multiecho pulse sequences of the brain and surrounding structures were obtained without intravenous contrast. COMPARISON:  None. FINDINGS: Motion artifact is present. Brain: Patchy and confluent areas of T2 hyperintensity are again identified in the supratentorial periventricular greater than subcortical white  matter. Burden does not appear to changed. Involvement of the posterior fossa is less apparent on this study likely on a technical basis. Prominence of the ventricles and sulci reflects stable parenchymal volume loss. Unchanged thinning of the corpus callosum. There is no acute infarction or intracranial hemorrhage. There is no intracranial mass, mass effect, or edema. There is no hydrocephalus or extra-axial fluid collection. Vascular: Major vessel flow voids at the skull base are preserved. Skull and upper cervical spine: Normal marrow signal is preserved. Sinuses/Orbits: Paranasal sinuses are aerated. Orbits are unremarkable. Other: Sella is unremarkable.  Mastoid air cells are clear. IMPRESSION: No apparent change in burden of chronic demyelination. Stable parenchymal volume loss. Electronically Signed   By: Guadlupe Spanish M.D.   On: 09/13/2020 14:13   MR CERVICAL SPINE WO CONTRAST  Result Date: 09/13/2020 CLINICAL DATA:  Multiple sclerosis EXAM: MRI CERVICAL SPINE WITHOUT CONTRAST TECHNIQUE: Multiplanar, multisequence MR imaging of the cervical spine was performed. No intravenous contrast was administered. COMPARISON:  07/17/2020 FINDINGS: Motion degraded. Alignment: Unchanged. Vertebrae: Vertebral body heights are stable. No apparent marrow edema. Cord: Poor evaluation of the cord due to artifact. T2 hyperintensity appears to be present at the C4 and C5 levels as before. No definite new abnormality. Posterior Fossa, vertebral arteries, paraspinal tissues: Brain reported separately. Otherwise no abnormality within above limitation. Disc levels: No degenerative stenosis identified. IMPRESSION: Significantly motion degraded with poor evaluation of the cord. Chronic demyelination at C4 and C5 is again identified. No definite new abnormality. Electronically Signed   By: Guadlupe Spanish M.D.   On: 09/13/2020 14:19   DG Chest Port 1 View  Result Date: 09/13/2020 CLINICAL DATA:  Rule out infection  EXAM:  PORTABLE CHEST 1 VIEW COMPARISON:  07/16/2020 FINDINGS: The heart size and mediastinal contours are within normal limits. Both lungs are clear. The visualized skeletal structures are unremarkable. IMPRESSION: No active disease. Electronically Signed   By: Marlan Palauharles  Clark M.D.   On: 09/13/2020 11:09   DG Foot Complete Right  Result Date: 09/13/2020 CLINICAL DATA:  30 year old male with pain. No known injury. EXAM: RIGHT FOOT COMPLETE - 3+ VIEW COMPARISON:  Right ankle series today. FINDINGS: Bone mineralization is within normal limits. There is no evidence of fracture or dislocation. There is no evidence of arthropathy or other focal bone abnormality. Soft tissues are unremarkable. IMPRESSION: Negative. Electronically Signed   By: Odessa FlemingH  Hall M.D.   On: 09/13/2020 10:20   DG Hip Unilat With Pelvis 2-3 Views Left  Result Date: 09/13/2020 CLINICAL DATA:  30 year old male with pain. No known injury. EXAM: DG HIP (WITH OR WITHOUT PELVIS) 2-3V LEFT COMPARISON:  Left hip series 04/19/2016. FINDINGS: Bone mineralization is within normal limits. Hip joint spaces appear stable and preserved. There is no evidence of hip fracture or dislocation. There is no evidence of arthropathy or other focal bone abnormality. SI joints appear normal. Stable, negative lower abdominal and pelvic visceral contours. IMPRESSION: Negative. Electronically Signed   By: Odessa FlemingH  Hall M.D.   On: 09/13/2020 10:21    Procedures Procedures   Medications Ordered in ED Medications  cefTRIAXone (ROCEPHIN) 1 g in sodium chloride 0.9 % 100 mL IVPB (1 g Intravenous New Bag/Given 09/13/20 1856)  LORazepam (ATIVAN) injection 1 mg (1 mg Intravenous Given 09/13/20 1213)  haloperidol lactate (HALDOL) injection 5 mg (5 mg Intravenous Given 09/13/20 1317)  sodium chloride 0.9 % bolus 1,000 mL (1,000 mLs Intravenous New Bag/Given 09/13/20 1751)    ED Course  I have reviewed the triage vital signs and the nursing notes.  Pertinent labs & imaging results that  were available during my care of the patient were reviewed by me and considered in my medical decision making (see chart for details).    MDM Rules/Calculators/A&P                          30 year old male with a history of schizophrenia and multiple sclerosis who presents to the ED via EMS with complaint of behavioral disturbance with auditory hallucinations as well as complaint of left hip and right foot pain.  Additional information obtained by mother's history is limited secondary to his psychiatric disorder.  It does appear that he has not been able to walk much recently as voices are telling him he cannot.  Mother thinks this is related to his voices and does not believe he is having an MS flare.  He receives Ocrevus injections every 8 months, last received in December.  On arrival to the ED vitals are stable.  Patient appears to be in no acute distress.  He is sitting in a wheelchair, unsure if EMS needed assistance getting patient into the wheelchair.  On my exam he has no focal neuro deficits.  He does have dysarthric speech however this appears to be at baseline due to his MS, there is also document of stroke in the past.  He has equal grip strength bilaterally and is able to move his legs with hip flexion, knee flexion/extension, dorsiflexion/plantarflexion equally.  2+ patellar reflexes.  Question auditory hallucinations/psychogenic etiology for patient's frequent falls and inability to ambulate versus MS flare.  Plan to attempt to ambulate patient, will  plan to discuss with neurology.  It does appear that patient fell and hit his head 2 days ago, he is on Eliquis and therefore will obtain a CT head.  We will also obtain x-rays of his right ankle, right foot, left hip.  We will obtain medical clearance labs, plan to have TTS see him given auditory hallucinations with psych history  Have attempted to ambulate patient, he was able to stand up from the wheelchair on his own after a lot of coaching  by nursing staff.  He was able to take 2 steps forward however then stated he felt off balance and wanted to sit back down.  I have discussed case with neurology who recommends MRI with and without of brain, C, T-spine to rule out MS flare and if any acute findings will be happy to see patient.  Also recommends to rule out infection including urinalysis as well as chest x-ray.   CT head negative for acute findings Hip xray negative Foot/ankle xray negative  Pt was premedication for MRI with ativan. Unfortunately while over there he required additional medication - provided with haldol. MRI team was only able to perform brain and C spine without contrast. He was brought back to the ED afterwards; MRIs are normal however very limited in the setting of motion artifact.  Neurology has evaluated patient; doubt MS flare at this time as pt's exam is very reassuring and he has been compliant with his ocrevus. Recommends orthostatics as pt stated he is dizzy when he stands. She will gain additional information from mom. Pending TTS eval as well.   Re-discussed case with Dr. Iver Nestle - recommends that pt be evaluated by psych for medication reconcilitation; he is unsafe for discharge home at this time given he is unable to ambulate. They do suspect this is psychogenic in nature however given he was unable to tolerate MRI they cannot exclude MS flare today. Recommend he be stabilized from a psychiatric standpoint and if unable to still ambulate after meds have been adjusted may consider rescanning. Have placed consult to psychiatry as well.   U/A has returned positive for UTI which could be attributing to possible recrudescence of prior lesions. Urine culture obtained and started on rocephin. Will call to admit to medicine. COVID not ordered as pt was positive in January.   At shift change case signed out to Fayrene Helper, PA-C who will admit patient.   Final Clinical Impression(s) / ED Diagnoses Final diagnoses:   Impaired ambulation  Schizophrenia, unspecified type (HCC)  Multiple sclerosis (HCC)  Acute cystitis with hematuria    Rx / DC Orders ED Discharge Orders    None       Tanda Rockers, PA-C 09/13/20 1914    Sabino Donovan, MD 09/14/20 [id number]

## 2020-09-13 NOTE — ED Triage Notes (Addendum)
Pt presents with c/o behavioral issues. Mom reports he has been hearing voices telling him to hurt himself. Pt c/o right foot and left hip pain. His mom reported to EMS that the slurred speech he has is baseline for him. When speaking to patient, he says that he is not hearing voices and has no SI/HI.

## 2020-09-14 DIAGNOSIS — Z79899 Other long term (current) drug therapy: Secondary | ICD-10-CM | POA: Diagnosis not present

## 2020-09-14 DIAGNOSIS — K5909 Other constipation: Secondary | ICD-10-CM | POA: Diagnosis present

## 2020-09-14 DIAGNOSIS — G35 Multiple sclerosis: Secondary | ICD-10-CM | POA: Diagnosis present

## 2020-09-14 DIAGNOSIS — Z7984 Long term (current) use of oral hypoglycemic drugs: Secondary | ICD-10-CM | POA: Diagnosis not present

## 2020-09-14 DIAGNOSIS — I10 Essential (primary) hypertension: Secondary | ICD-10-CM

## 2020-09-14 DIAGNOSIS — G9341 Metabolic encephalopathy: Secondary | ICD-10-CM | POA: Diagnosis present

## 2020-09-14 DIAGNOSIS — Z86711 Personal history of pulmonary embolism: Secondary | ICD-10-CM | POA: Diagnosis not present

## 2020-09-14 DIAGNOSIS — N3001 Acute cystitis with hematuria: Secondary | ICD-10-CM | POA: Diagnosis present

## 2020-09-14 DIAGNOSIS — F201 Disorganized schizophrenia: Secondary | ICD-10-CM | POA: Diagnosis present

## 2020-09-14 DIAGNOSIS — F17210 Nicotine dependence, cigarettes, uncomplicated: Secondary | ICD-10-CM | POA: Diagnosis present

## 2020-09-14 DIAGNOSIS — R519 Headache, unspecified: Secondary | ICD-10-CM | POA: Diagnosis present

## 2020-09-14 DIAGNOSIS — M21372 Foot drop, left foot: Secondary | ICD-10-CM | POA: Diagnosis present

## 2020-09-14 DIAGNOSIS — R296 Repeated falls: Secondary | ICD-10-CM | POA: Diagnosis present

## 2020-09-14 DIAGNOSIS — R443 Hallucinations, unspecified: Secondary | ICD-10-CM | POA: Diagnosis not present

## 2020-09-14 DIAGNOSIS — Z6841 Body Mass Index (BMI) 40.0 and over, adult: Secondary | ICD-10-CM | POA: Diagnosis not present

## 2020-09-14 DIAGNOSIS — F909 Attention-deficit hyperactivity disorder, unspecified type: Secondary | ICD-10-CM | POA: Diagnosis present

## 2020-09-14 DIAGNOSIS — I2782 Chronic pulmonary embolism: Secondary | ICD-10-CM

## 2020-09-14 DIAGNOSIS — Z7901 Long term (current) use of anticoagulants: Secondary | ICD-10-CM | POA: Diagnosis not present

## 2020-09-14 DIAGNOSIS — Z79891 Long term (current) use of opiate analgesic: Secondary | ICD-10-CM | POA: Diagnosis not present

## 2020-09-14 DIAGNOSIS — G8929 Other chronic pain: Secondary | ICD-10-CM | POA: Diagnosis present

## 2020-09-14 DIAGNOSIS — Z9181 History of falling: Secondary | ICD-10-CM | POA: Diagnosis not present

## 2020-09-14 DIAGNOSIS — I69354 Hemiplegia and hemiparesis following cerebral infarction affecting left non-dominant side: Secondary | ICD-10-CM | POA: Diagnosis not present

## 2020-09-14 DIAGNOSIS — R471 Dysarthria and anarthria: Secondary | ICD-10-CM | POA: Diagnosis present

## 2020-09-14 DIAGNOSIS — W19XXXA Unspecified fall, initial encounter: Secondary | ICD-10-CM | POA: Diagnosis present

## 2020-09-14 DIAGNOSIS — Z818 Family history of other mental and behavioral disorders: Secondary | ICD-10-CM | POA: Diagnosis not present

## 2020-09-14 DIAGNOSIS — F79 Unspecified intellectual disabilities: Secondary | ICD-10-CM | POA: Diagnosis present

## 2020-09-14 NOTE — ED Notes (Signed)
Brandon [name] with MRI said that pt has to have the MRI done at [facility] under anesthesia.  This Clinical research associate tried to contact Dr. Beola Cord who is on his chart at the attending/admitting physician.  Dr. Alinda Money is not on until 6 pm.  Contacted the [facility], Dawn RN, who said to try contacting Dr. Manus Rudd and/or Dr. Alease Frame. Both were messaged.

## 2020-09-14 NOTE — Progress Notes (Signed)
PROGRESS NOTE    Brandon [name]  MVH:[id number] DOB: [birth year] DOA: 09/13/2020 PCP: Maudie Flakes, FNP   Brief Narrative:  Brandon [name] is a 30 y.o. male with medical history significant of gait abnormality, ADHD, disorganized schizophrenia, intellectual disability, hypertension, prior stroke with left-sided weakness, multiple sclerosis, obesity, history of PE who presents after having multiple falls and worsening auditory hallucinations from home. Lives with his mother who is primary historian given mental status changes this am. Multiple episodes over the past few weeks where pt supports himself to sit down and does not have a traumatic fall. Notable recent urinary incontinence and more chronic headaches as he has hit himself in the head in order to get the voices to stop - again increasing in frequency over the past few weeks. Only new medication/change over the past few weeks/months was topamax for headache.  In ED: Patient received extra IV doses of both Ativan and Haldol as well as was initiated on ceftriaxone given questionably urinary frequency/incontinence and abnormal UA. Psych and Neuro consulted at admission.  Assessment & Plan:   Principal Problem:   Falls Active Problems:   HTN (hypertension)   Multiple sclerosis (HCC)   Left-sided weakness   Hallucinations   Disorganized schizophrenia (HCC)   Schizoaffective disorder, bipolar type (HCC)   Pulmonary embolism (HCC)   Unspecified intellectual disabilities    Acute mental status changes  Acute ambulatory dysfunction with recurrent falls  Recurrent auditory hallucinations in the setting of disorganized schizophrenia with bipolar type  -Patient's "falls" are 'assisted' insomuch that he does not have traumatic falls but catches himself before falling.  Regardless would likely discontinue anticoagulation in the setting of increased fall risk and history of striking himself in the head. -Imaging overnight was poorly  tolerated, given presentation and history we will not pursue anesthesia at [facility] main campus for MRI given his symptoms. -Neurology consulted at intake, no recommendations for steroids which indicates this is very atypical for an MS flare presentation.  Neurology already signed off, will reconsult if necessary. -Psychiatry also consulted, given patient's profound history we appreciate insight and recommendations on medication management.  Currently awaiting medical clearance for likely inpatient psych admission. -We will treat questionable UTI as well as attempt to adjust medications with the assistance of psychiatry to avoid polypharmacy.  No recent medication changes in the outpatient setting per mother other than addition of tramadol for headaches. -Currently patient is continued on Saphris, Cogentin, Haldol, Topamax -patient has been receiving Ocrevus injections in the outpatient setting per mother, last known injection December 2021 -Per neurology, if patient continues to have difficulty ambulating after behavioral issues are addressed would reorder MRI Brain and C Spine W WO Contrast and reconsult neurology  Questionable UTI, POA -Unable to corroborate symptoms, UA abnormal, will continue 3 days of cefdinir to cover possible UTI  -Cultures pending  Headaches - Continue topamax - Headaches appear to be somewhat self-induced per mother, patient strikes himself in the head to "quiet the voices" -We will likely discontinue Eliquis if patient remains high risk for traumatic head injury in the setting of falls and self injury  Hypertension - Continue home   Left-sided foot drop, chronic - No indication for current intervention  History of PE - Continue home Eliquis   DVT prophylaxis: Eliquis  Code Status: Full  Family Communication: Mother updated over the phone at length about prognosis disposition and current plan  Status is: Inpatient  Dispo: The patient is from: Home  Anticipated d/c is to: Inpatient psych              Anticipated d/c date is: 40 to 72 hours pending clinical course              Patient currently not medically stable for discharge  Consultants:   Psych, neuro  Procedures:   None  Antimicrobials:  Cefdinir x3 days last dose 09/15/2020  Subjective: Patient had multiple sedating medications overnight for imaging, review of systems this morning markedly limited in the setting of polypharmacy.  Will reevaluate this afternoon with hopeful better response.  Objective: Vitals:   09/13/20 1705 09/13/20 1708 09/13/20 1712 09/14/20 0422  BP: 139/85 (!) 150/115 (!) 127/58 118/65  Pulse: 71 79 95 66  Resp:    16  Temp:    (!) 97.5 F (36.4 C)  TempSrc:    Oral  SpO2: 100% 100% 98% 100%   No intake or output data in the 24 hours ending 09/14/20 0827 There were no vitals filed for this visit.  Examination:  General:  Pleasantly resting in bed, No acute distress. HEENT:  Normocephalic atraumatic.  Sclerae nonicteric, noninjected.  Extraocular movements intact bilaterally. Neck:  Without mass or deformity.  Trachea is midline. Lungs:  Clear to auscultate bilaterally without rhonchi, wheeze, or rales. Heart:  Regular rate and rhythm.  Without murmurs, rubs, or gallops. Abdomen:  Soft, nontender, nondistended.  Without guarding or rebound. Extremities: Without cyanosis, clubbing, edema Vascular:  Dorsalis pedis and posterior tibial pulses palpable bilaterally. Skin:  Warm and dry, no erythema  Data Reviewed: I have personally reviewed following labs and imaging studies  CBC: Recent Labs  Lab 09/13/20 1023  WBC 5.8  NEUTROABS 3.7  HGB 13.1  HCT 42.5  MCV 83.3  PLT 189   Basic Metabolic Panel: Recent Labs  Lab 09/13/20 1023  NA 138  K 4.3  CL 107  CO2 23  GLUCOSE 97  BUN 16  CREATININE 1.07  CALCIUM 9.2   GFR: Estimated Creatinine Clearance: 154.7 mL/min (by C-G formula based on SCr of 1.07 mg/dL). Liver  Function Tests: Recent Labs  Lab 09/13/20 1023  AST 16  ALT 33  ALKPHOS 84  BILITOT 0.7  PROT 7.4  ALBUMIN 4.8   No results for input(s): LIPASE, AMYLASE in the last 168 hours. No results for input(s): AMMONIA in the last 168 hours. Coagulation Profile: No results for input(s): INR, PROTIME in the last 168 hours. Cardiac Enzymes: No results for input(s): CKTOTAL, CKMB, CKMBINDEX, TROPONINI in the last 168 hours. BNP (last 3 results) No results for input(s): PROBNP in the last 8760 hours. HbA1C: No results for input(s): HGBA1C in the last 72 hours. CBG: No results for input(s): GLUCAP in the last 168 hours. Lipid Profile: No results for input(s): CHOL, HDL, LDLCALC, TRIG, CHOLHDL, LDLDIRECT in the last 72 hours. Thyroid Function Tests: No results for input(s): TSH, T4TOTAL, FREET4, T3FREE, THYROIDAB in the last 72 hours. Anemia Panel: No results for input(s): VITAMINB12, FOLATE, FERRITIN, TIBC, IRON, RETICCTPCT in the last 72 hours. Sepsis Labs: No results for input(s): PROCALCITON, LATICACIDVEN in the last 168 hours.  No results found for this or any previous visit (from the past 240 hour(s)).       Radiology Studies: DG Ankle Complete Right  Result Date: 09/13/2020 CLINICAL DATA:  30 year old male with pain.  No known injury. EXAM: RIGHT ANKLE - COMPLETE 3+ VIEW COMPARISON:  None. FINDINGS: Bone mineralization is within normal limits. There is no evidence  of fracture, dislocation, or joint effusion. There is no evidence of arthropathy or other focal bone abnormality. Soft tissues are unremarkable. IMPRESSION: Negative. Electronically Signed   By: Odessa Fleming M.D.   On: 09/13/2020 10:19   CT Head Wo Contrast  Result Date: 09/13/2020 CLINICAL DATA:  30 year old male status post fall 2 days ago. Altered mental status. History of multiple sclerosis. EXAM: CT HEAD WITHOUT CONTRAST TECHNIQUE: Contiguous axial images were obtained from the base of the skull through the vertex  without intravenous contrast. COMPARISON:  Brain MRI 07/17/2020 the, head CT 07/16/2020. FINDINGS: Brain: Stable cerebral volume, ex vacuo ventricular enlargement. Patchy and confluent white matter hypodensity greater in the right hemisphere appears stable. No superimposed midline shift, ventriculomegaly, mass effect, evidence of mass lesion, intracranial hemorrhage or evidence of cortically based acute infarction. Vascular: No suspicious intracranial vascular hyperdensity. Skull: No acute osseous abnormality identified. Sinuses/Orbits: Sinuses, tympanic cavities and mastoids are hyperplastic and clear. Other: Similar leftward gaze to the prior CT. No acute orbit or scalp soft tissue finding. IMPRESSION: 1. No acute intracranial abnormality or acute traumatic injury identified. 2. Stable advanced white matter disease and associated cerebral volume loss Electronically Signed   By: Odessa Fleming M.D.   On: 09/13/2020 10:25   MR BRAIN WO CONTRAST  Result Date: 09/13/2020 CLINICAL DATA:  Multiple sclerosis EXAM: MRI HEAD WITHOUT CONTRAST TECHNIQUE: Multiplanar, multiecho pulse sequences of the brain and surrounding structures were obtained without intravenous contrast. COMPARISON:  None. FINDINGS: Motion artifact is present. Brain: Patchy and confluent areas of T2 hyperintensity are again identified in the supratentorial periventricular greater than subcortical white matter. Burden does not appear to changed. Involvement of the posterior fossa is less apparent on this study likely on a technical basis. Prominence of the ventricles and sulci reflects stable parenchymal volume loss. Unchanged thinning of the corpus callosum. There is no acute infarction or intracranial hemorrhage. There is no intracranial mass, mass effect, or edema. There is no hydrocephalus or extra-axial fluid collection. Vascular: Major vessel flow voids at the skull base are preserved. Skull and upper cervical spine: Normal marrow signal is preserved.  Sinuses/Orbits: Paranasal sinuses are aerated. Orbits are unremarkable. Other: Sella is unremarkable.  Mastoid air cells are clear. IMPRESSION: No apparent change in burden of chronic demyelination. Stable parenchymal volume loss. Electronically Signed   By: Guadlupe Spanish M.D.   On: 09/13/2020 14:13   MR CERVICAL SPINE WO CONTRAST  Result Date: 09/13/2020 CLINICAL DATA:  Multiple sclerosis EXAM: MRI CERVICAL SPINE WITHOUT CONTRAST TECHNIQUE: Multiplanar, multisequence MR imaging of the cervical spine was performed. No intravenous contrast was administered. COMPARISON:  07/17/2020 FINDINGS: Motion degraded. Alignment: Unchanged. Vertebrae: Vertebral body heights are stable. No apparent marrow edema. Cord: Poor evaluation of the cord due to artifact. T2 hyperintensity appears to be present at the C4 and C5 levels as before. No definite new abnormality. Posterior Fossa, vertebral arteries, paraspinal tissues: Brain reported separately. Otherwise no abnormality within above limitation. Disc levels: No degenerative stenosis identified. IMPRESSION: Significantly motion degraded with poor evaluation of the cord. Chronic demyelination at C4 and C5 is again identified. No definite new abnormality. Electronically Signed   By: Guadlupe Spanish M.D.   On: 09/13/2020 14:19   DG Chest Port 1 View  Result Date: 09/13/2020 CLINICAL DATA:  Rule out infection EXAM: PORTABLE CHEST 1 VIEW COMPARISON:  07/16/2020 FINDINGS: The heart size and mediastinal contours are within normal limits. Both lungs are clear. The visualized skeletal structures are unremarkable. IMPRESSION: No active disease.  Electronically Signed   By: Marlan Palau M.D.   On: 09/13/2020 11:09   DG Foot Complete Right  Result Date: 09/13/2020 CLINICAL DATA:  30 year old male with pain. No known injury. EXAM: RIGHT FOOT COMPLETE - 3+ VIEW COMPARISON:  Right ankle series today. FINDINGS: Bone mineralization is within normal limits. There is no evidence of  fracture or dislocation. There is no evidence of arthropathy or other focal bone abnormality. Soft tissues are unremarkable. IMPRESSION: Negative. Electronically Signed   By: Odessa Fleming M.D.   On: 09/13/2020 10:20   DG Hip Unilat With Pelvis 2-3 Views Left  Result Date: 09/13/2020 CLINICAL DATA:  30 year old male with pain. No known injury. EXAM: DG HIP (WITH OR WITHOUT PELVIS) 2-3V LEFT COMPARISON:  Left hip series 04/19/2016. FINDINGS: Bone mineralization is within normal limits. Hip joint spaces appear stable and preserved. There is no evidence of hip fracture or dislocation. There is no evidence of arthropathy or other focal bone abnormality. SI joints appear normal. Stable, negative lower abdominal and pelvic visceral contours. IMPRESSION: Negative. Electronically Signed   By: Odessa Fleming M.D.   On: 09/13/2020 10:21        Scheduled Meds: . amLODipine  10 mg Oral Daily  . apixaban  5 mg Oral BID  . asenapine  10 mg Sublingual TID  . benztropine  1 mg Oral BID  . cefdinir  300 mg Oral Q12H  . haloperidol  10 mg Oral BID  . haloperidol decanoate  200 mg Intramuscular Q30 days  . melatonin  10 mg Oral QHS  . metFORMIN  500 mg Oral Q breakfast  . topiramate  25 mg Oral BID   Continuous Infusions:   LOS: 0 days   Time spent:  Azucena Fallen, DO [facility]  If 7PM-7AM, please contact night-coverage [email or web address]  09/14/2020, 8:27 AM

## 2020-09-14 NOTE — ED Notes (Signed)
No verbal report received. Chart reviewed.

## 2020-09-14 NOTE — Plan of Care (Signed)

## 2020-09-14 NOTE — ED Notes (Signed)
Pt has been calm and cooperative.  Appears to be lethargic. Requires set up and some assistance. Verbalized need for sugar and cream for coffee.  Feeds self. No c/o pain Voiced.  Takes pills whole. This writer put them in his mouth. He asked for more water. Tracking a presence with eyes is slow.

## 2020-09-14 NOTE — Consult Note (Addendum)
  Patient is currently off the floor at this time, unable to evaluate.  Appears patient is to be transferred to [facility] for MRI under anesthesia.  Patient was recently assessed by TTS, who recommended inpatient admission once medically stable.  Psychiatric consult placed for medication evaluation.  Patient is currently receiving outpatient services and [facility], under the care of Dr. Evelene Croon.  It appears patient presented with concerns about falling x6 weeks.  Neurology has been consulted and psychiatry has been consulted for medication evaluation.  Patient has received Haldol decanoate 200 mg IM( 09/06/2020), Cogentin 1 mg p.o. twice daily, Haldol 2 mg p.o. twice daily , asenapine 10 mg p.o. 3 times daily, trazodone 100 mg p.o. nightly.  Last received monthly Haldol injection on September 06, 2020, next scheduled October 06, 2020.  -Patient not available to complete psychiatric evaluation.  -Will likely benefit from medication adjustment, as multiple antipsychotics can increase fall risk.  Last medication adjustment was made November 2021, in which asenapine was added to his medication regimen for management of his disorganized schizophrenia. -Attempt was made to reach out to clinical pharmacist regarding initiation of Ingrezza for management tardive dyskinesia.

## 2020-09-14 NOTE — Progress Notes (Signed)
PT Cancellation Note  Patient Details Name: Brandon [name] MRN: [id number] DOB: 1991-01-28   Cancelled Treatment:    Reason Eval/Treat Not Completed: Patient's level of consciousness   Rada Hay 09/14/2020, 11:38 AM  Blanchard Kelch PT Acute Rehabilitation Services Pager [phone] Office [phone]

## 2020-09-15 ENCOUNTER — Other Ambulatory Visit: Payer: Self-pay

## 2020-09-15 DIAGNOSIS — W19XXXA Unspecified fall, initial encounter: Secondary | ICD-10-CM | POA: Diagnosis not present

## 2020-09-15 DIAGNOSIS — R443 Hallucinations, unspecified: Secondary | ICD-10-CM | POA: Diagnosis not present

## 2020-09-15 DIAGNOSIS — F201 Disorganized schizophrenia: Secondary | ICD-10-CM | POA: Diagnosis not present

## 2020-09-15 DIAGNOSIS — G9341 Metabolic encephalopathy: Secondary | ICD-10-CM | POA: Diagnosis not present

## 2020-09-15 LAB — CBC
HCT: 42.4 % (ref 39.0–52.0)
Hemoglobin: 13.1 g/dL (ref 13.0–17.0)
MCH: 25.7 pg — ABNORMAL LOW (ref 26.0–34.0)
MCHC: 30.9 g/dL (ref 30.0–36.0)
MCV: 83.1 fL (ref 80.0–100.0)
Platelets: 194 10*3/uL (ref 150–400)
RBC: 5.1 MIL/uL (ref 4.22–5.81)
RDW: 14.6 % (ref 11.5–15.5)
WBC: 6.8 10*3/uL (ref 4.0–10.5)
nRBC: 0 % (ref 0.0–0.2)

## 2020-09-15 LAB — COMPREHENSIVE METABOLIC PANEL
ALT: 26 U/L (ref 0–44)
AST: 10 U/L — ABNORMAL LOW (ref 15–41)
Albumin: 4 g/dL (ref 3.5–5.0)
Alkaline Phosphatase: 76 U/L (ref 38–126)
Anion gap: 9 (ref 5–15)
BUN: 16 mg/dL (ref 6–20)
CO2: 22 mmol/L (ref 22–32)
Calcium: 9.1 mg/dL (ref 8.9–10.3)
Chloride: 106 mmol/L (ref 98–111)
Creatinine, Ser: 0.89 mg/dL (ref 0.61–1.24)
GFR, Estimated: >60 mL/min (ref >60)
Glucose, Bld: 108 mg/dL — ABNORMAL HIGH (ref 70–99)
Potassium: 3.6 mmol/L (ref 3.5–5.1)
Sodium: 137 mmol/L (ref 135–145)
Total Bilirubin: 0.7 mg/dL (ref 0.3–1.2)
Total Protein: 6.7 g/dL (ref 6.5–8.1)

## 2020-09-15 NOTE — Progress Notes (Signed)
TOC CM/CSW attempted to contact [facility].  No message could be left.  CSW will continue to follow for dc needs.  Beatrice Ziehm Tarpley-Carter, MSW, LCSW-A Pronouns:  She, Her, Hers                  [facility] Transitions of CareClinical Social Worker [email or web address] [phone]

## 2020-09-15 NOTE — Progress Notes (Signed)
TOC CM/CSW spoke with individual at [facility].  It was stated that there were no available beds currently due to staffing.  CSW will continue to follow for dc needs.  Numa Schroeter Tarpley-Carter, MSW, LCSW-A Pronouns:  She, Her, Hers                  [facility] Transitions of CareClinical Social Worker [email or web address] [phone]

## 2020-09-15 NOTE — Evaluation (Addendum)
Physical Therapy Evaluation Patient Details Name: Brandon [name] MRN: [id number] DOB: 1991/05/04 Today's Date: 09/15/2020   History of Present Illness  Brandon [name] is a 30 y.o. male with medical history significant of gait abnormality, ADHD, disorganized schizophrenia, intellectual disability, hypertension, prior stroke with left-sided weakness, multiple sclerosis, obesity, history of PE who presents after having multiple falls and worsening auditory hallucinations from home.  Clinical Impression  Pt admitted with above diagnosis. Pt currently with functional limitations due to the deficits listed below (see PT Problem List). Pt will benefit from skilled PT to increase their independence and safety with mobility to allow discharge to the venue listed below.  Pt needing +2 A at this time for safety.  Anticipate that he may do better in his own environment.  When asked about the falls at home he said, it was because he would feel weird and then just sit down. Recommend HHPT.     Follow Up Recommendations Home health PT    Equipment Recommendations  None recommended by PT    Recommendations for Other Services       Precautions / Restrictions Precautions Precautions: Fall      Mobility  Bed Mobility Overal bed mobility: Needs Assistance Bed Mobility: Supine to Sit     Supine to sit: Supervision     General bed mobility comments: When given more time with rail and HOB elevated, he was able to come to side of bed with S and cues.    Transfers Overall transfer level: Needs assistance Equipment used: Rolling walker (2 wheeled) Transfers: Sit to/from Stand Sit to Stand: Mod assist;From elevated surface;Min assist         General transfer comment: cues for hand placement  Ambulation/Gait Ambulation/Gait assistance: Min assist;Mod assist;+2 physical assistance Gait Distance (Feet): 5 Feet Assistive device: Rolling walker (2 wheeled) Gait Pattern/deviations: Step-to  pattern;Shuffle     General Gait Details: Sidestepping in front of bed. Difficulty with weight shifting. He spaced out a bit and did not feel it was safe to ambulate away from the bed.  Stairs            Wheelchair Mobility    Modified Rankin (Stroke Patients Only)       Balance Overall balance assessment: History of Falls;Needs assistance Sitting-balance support: Feet supported Sitting balance-Leahy Scale: Fair       Standing balance-Leahy Scale: Poor                               Pertinent Vitals/Pain Pain Assessment: No/denies pain    Home Living Family/patient expects to be discharged to:: Private residence Living Arrangements: Parent Available Help at Discharge: Family;Available 24 hours/day   Home Access: Stairs to enter   Entrance Stairs-Number of Steps: 2 Home Layout: One level        Prior Function Level of Independence: Independent with assistive device(s)         Comments: typically ambulates by himself, but states sometimes his mom is with him.     Hand Dominance        Extremity/Trunk Assessment   Upper Extremity Assessment Upper Extremity Assessment: Generalized weakness    Lower Extremity Assessment Lower Extremity Assessment: Generalized weakness       Communication   Communication: Expressive difficulties;Other (comment) (hard to understand at times)  Cognition Arousal/Alertness: Awake/alert Behavior During Therapy: Flat affect Overall Cognitive Status: No family/caregiver present to determine baseline cognitive functioning  General Comments: At times he would be following directions and then he would seem to space out.      General Comments      Exercises     Assessment/Plan    PT Assessment Patient needs continued PT services  PT Problem List Decreased strength;Decreased activity tolerance;Decreased balance;Decreased cognition;Decreased mobility;Decreased  safety awareness       PT Treatment Interventions Gait training;Functional mobility training;Neuromuscular re-education;Balance training;Therapeutic exercise;Therapeutic activities    PT Goals (Current goals can be found in the Care Plan section)  Acute Rehab PT Goals Patient Stated Goal: Agreeable to PT PT Goal Formulation: With patient Time For Goal Achievement: 09/29/20 Potential to Achieve Goals: Fair    Frequency Min 3X/week   Barriers to discharge        Co-evaluation               AM-PAC PT "6 Clicks" Mobility  Outcome Measure Help needed turning from your back to your side while in a flat bed without using bedrails?: A Little Help needed moving from lying on your back to sitting on the side of a flat bed without using bedrails?: A Little Help needed moving to and from a bed to a chair (including a wheelchair)?: A Little Help needed standing up from a chair using your arms (e.g., wheelchair or bedside chair)?: A Little Help needed to walk in hospital room?: A Lot Help needed climbing 3-5 steps with a railing? : A Lot 6 Click Score: 16    End of Session Equipment Utilized During Treatment: Gait belt Activity Tolerance: Patient tolerated treatment well Patient left: in bed;with call bell/phone within reach;with nursing/sitter in room Nurse Communication: Mobility status PT Visit Diagnosis: Unsteadiness on feet (R26.81);Other abnormalities of gait and mobility (R26.89);History of falling (Z91.81)    Time: 1287-8676 PT Time Calculation (min) (ACUTE ONLY): 23 min   Charges:   PT Evaluation $PT Eval Low Complexity: 1 Low PT Treatments $Therapeutic Activity: 8-22 mins        Brandon [name], [place] Pager [phone] 09/15/2020   Enzo Montgomery 09/15/2020, 1:07 PM
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
## 2020-09-15 NOTE — Plan of Care (Signed)
  Problem: Education: Goal: Knowledge of General Education information will improve Description: Including pain rating scale, medication(s)/side effects and non-pharmacologic comfort measures 09/15/2020 0307 by Robyne Peers, RN Outcome: Progressing 09/14/2020 2207 by Robyne Peers, RN Outcome: Progressing   Problem: Health Behavior/Discharge Planning: Goal: Ability to manage health-related needs will improve 09/15/2020 0307 by Robyne Peers, RN Outcome: Progressing 09/14/2020 2207 by Robyne Peers, RN Outcome: Progressing   Problem: Clinical Measurements: Goal: Ability to maintain clinical measurements within normal limits will improve 09/15/2020 0307 by Robyne Peers, RN Outcome: Progressing 09/14/2020 2207 by Robyne Peers, RN Outcome: Progressing Goal: Will remain free from infection 09/15/2020 0307 by Robyne Peers, RN Outcome: Progressing 09/14/2020 2207 by Robyne Peers, RN Outcome: Progressing Goal: Diagnostic test results will improve 09/15/2020 0307 by Robyne Peers, RN Outcome: Progressing 09/14/2020 2207 by Robyne Peers, RN Outcome: Progressing Goal: Respiratory complications will improve 09/15/2020 0307 by Robyne Peers, RN Outcome: Progressing 09/14/2020 2207 by Robyne Peers, RN Outcome: Progressing Goal: Cardiovascular complication will be avoided 09/15/2020 0307 by Robyne Peers, RN Outcome: Progressing 09/14/2020 2207 by Robyne Peers, RN Outcome: Progressing   Problem: Activity: Goal: Risk for activity intolerance will decrease 09/15/2020 0307 by Robyne Peers, RN Outcome: Progressing 09/14/2020 2207 by Robyne Peers, RN Outcome: Progressing   Problem: Nutrition: Goal: Adequate nutrition will be maintained 09/15/2020 0307 by Robyne Peers, RN Outcome: Progressing 09/14/2020 2207 by Robyne Peers, RN Outcome: Progressing   Problem: Coping: Goal: Level of anxiety will decrease 09/15/2020 0307 by Robyne Peers, RN Outcome:  Progressing 09/14/2020 2207 by Robyne Peers, RN Outcome: Progressing   Problem: Elimination: Goal: Will not experience complications related to bowel motility 09/15/2020 0307 by Robyne Peers, RN Outcome: Progressing 09/14/2020 2207 by Robyne Peers, RN Outcome: Progressing Goal: Will not experience complications related to urinary retention 09/15/2020 0307 by Robyne Peers, RN Outcome: Progressing 09/14/2020 2207 by Robyne Peers, RN Outcome: Progressing   Problem: Pain Managment: Goal: General experience of comfort will improve 09/15/2020 0307 by Robyne Peers, RN Outcome: Progressing 09/14/2020 2207 by Robyne Peers, RN Outcome: Progressing   Problem: Safety: Goal: Ability to remain free from injury will improve 09/15/2020 0307 by Robyne Peers, RN Outcome: Progressing 09/14/2020 2207 by Robyne Peers, RN Outcome: Progressing   Problem: Skin Integrity: Goal: Risk for impaired skin integrity will decrease 09/15/2020 0307 by Robyne Peers, RN Outcome: Progressing 09/14/2020 2207 by Robyne Peers, RN Outcome: Progressing

## 2020-09-16 DIAGNOSIS — G9341 Metabolic encephalopathy: Secondary | ICD-10-CM | POA: Diagnosis not present

## 2020-09-16 DIAGNOSIS — F201 Disorganized schizophrenia: Secondary | ICD-10-CM | POA: Diagnosis not present

## 2020-09-16 DIAGNOSIS — R443 Hallucinations, unspecified: Secondary | ICD-10-CM | POA: Diagnosis not present

## 2020-09-16 DIAGNOSIS — W19XXXA Unspecified fall, initial encounter: Secondary | ICD-10-CM | POA: Diagnosis not present

## 2020-09-16 LAB — URINE CULTURE: Culture: >=100000 — AB

## 2020-09-16 LAB — CBC
HCT: 41.9 % (ref 39.0–52.0)
Hemoglobin: 13.2 g/dL (ref 13.0–17.0)
MCH: 25.9 pg — ABNORMAL LOW (ref 26.0–34.0)
MCHC: 31.5 g/dL (ref 30.0–36.0)
MCV: 82.3 fL (ref 80.0–100.0)
Platelets: 198 10*3/uL (ref 150–400)
RBC: 5.09 MIL/uL (ref 4.22–5.81)
RDW: 14.6 % (ref 11.5–15.5)
WBC: 6.4 10*3/uL (ref 4.0–10.5)
nRBC: 0 % (ref 0.0–0.2)

## 2020-09-16 LAB — COMPREHENSIVE METABOLIC PANEL
ALT: 24 U/L (ref 0–44)
AST: 15 U/L (ref 15–41)
Albumin: 3.7 g/dL (ref 3.5–5.0)
Alkaline Phosphatase: 71 U/L (ref 38–126)
Anion gap: 7 (ref 5–15)
BUN: 16 mg/dL (ref 6–20)
CO2: 24 mmol/L (ref 22–32)
Calcium: 9.1 mg/dL (ref 8.9–10.3)
Chloride: 106 mmol/L (ref 98–111)
Creatinine, Ser: 0.91 mg/dL (ref 0.61–1.24)
GFR, Estimated: >60 mL/min (ref >60)
Glucose, Bld: 100 mg/dL — ABNORMAL HIGH (ref 70–99)
Potassium: 3.5 mmol/L (ref 3.5–5.1)
Sodium: 137 mmol/L (ref 135–145)
Total Bilirubin: 0.6 mg/dL (ref 0.3–1.2)
Total Protein: 6.6 g/dL (ref 6.5–8.1)

## 2020-09-16 NOTE — Plan of Care (Signed)

## 2020-09-16 NOTE — Plan of Care (Signed)
  Problem: Education: Goal: Knowledge of General Education information will improve Description: Including pain rating scale, medication(s)/side effects and non-pharmacologic comfort measures Outcome: Progressing   Problem: Clinical Measurements: Goal: Ability to maintain clinical measurements within normal limits will improve Outcome: Progressing Goal: Diagnostic test results will improve Outcome: Progressing   Problem: Elimination: Goal: Will not experience complications related to urinary retention Outcome: Progressing   Problem: Safety: Goal: Ability to remain free from injury will improve Outcome: Progressing   

## 2020-09-16 NOTE — Progress Notes (Signed)
PROGRESS NOTE    Brandon [name]  KDX:[id number] DOB: March 03, 1991 DOA: 09/13/2020 PCP: Maudie Flakes, FNP   Brief Narrative:  Brandon [name] is a 30 y.o. male with medical history significant of gait abnormality, ADHD, disorganized schizophrenia, intellectual disability, hypertension, prior stroke with left-sided weakness, multiple sclerosis, obesity, history of PE who presents after having multiple falls and worsening auditory hallucinations from home. Lives with his mother who is primary historian given mental status changes this am. Multiple episodes over the past few weeks where pt supports himself to sit down and does not have a traumatic fall. Notable recent urinary incontinence and more chronic headaches as he has hit himself in the head in order to get the voices to stop - again increasing in frequency over the past few weeks. Only new medication/change over the past few weeks/months was topamax for headache.  Patient is medically stable for discharge, has completed antibiotics for presumed UTI otherwise will need placement per previous psychiatric evaluation.   Assessment & Plan:   Principal Problem:   Falls Active Problems:   HTN (hypertension)   Multiple sclerosis (HCC)   Left-sided weakness   Hallucinations   Disorganized schizophrenia (HCC)   Schizoaffective disorder, bipolar type (HCC)   Pulmonary embolism (HCC)   Unspecified intellectual disabilities   Acute metabolic encephalopathy  Acute mental status changes  Acute ambulatory dysfunction with recurrent falls  Recurrent auditory hallucinations in the setting of disorganized schizophrenia with bipolar type  -Patient's "falls" are 'assisted' insomuch that he does not have traumatic falls but catches himself before falling.  Regardless would recommend discontinuation of anticoagulation in the setting of increased fall risk and history of striking himself in the head. -Imaging limited at admission, neurology signed  off, no recommendations for steroids which indicates this is very atypical for an MS flare presentation. -Psychiatry also consulted, given patient's profound history we appreciate insight and recommendations on medication management.  Previously recommending inpatient psychiatric admission. -No recent medication changes other than addition of tramadol for headaches -Completed treatment for UTI. -Currently patient is continued on Saphris, Cogentin, Haldol, Topamax -patient has been receiving Ocrevus injections in the outpatient setting per mother, last known injection December 2021 -Per neurology, if patient continues to have difficulty ambulating after behavioral issues are addressed would reorder MRI Brain and C Spine W WO Contrast and reconsult neurology -thankfully patient able to ambulate with PT and nursing staff over the weekend without any notable deficits or weaknesses.  Questionable UTI, POA, resolved - Patient now more awake denies any recent symptoms but mother indicates he was having increased frequency over the past few days -Completed 3 days of cefdinir for presumed UTI  Headaches, multifactorial - Continue topamax - Headaches appear to be somewhat self-induced per mother, patient strikes himself in the head to "quiet the voices" -Would recommend discontinuation of Eliquis if patient remains high risk for traumatic head injury in the setting of falls and self injury  Hypertension - Continue home   Left-sided foot drop, chronic - No indication for current intervention  History of PE - Continue home Eliquis  DVT prophylaxis: Eliquis -as discussed above if patient remains high fall risk or continues to strike his own head and increases risk for traumatic injury or intracranial bleed would likely discontinue Eliquis. Code Status:  Full  Family Communication: Mother updated over the phone at length about prognosis disposition and current plan  Status is: Inpatient  Dispo:  The patient is from: Home  Anticipated d/c is to: Inpatient psych              Anticipated d/c date is: Imminent              Patient currently is medically stable for discharge, awaiting inpatient psych placement  Consultants:   Psych, neuro  Procedures:   None  Antimicrobials:  Cefdinir x3 days last dose 09/15/2020  Subjective: Patient much more appropriate overnight, no further episodes of visual or auditory hallucinations per staff, patient no longer touching himself inappropriately.  At this time patient is requesting discharge home which we discussed would need to be cleared by psych as a previously recommended inpatient psych admission.  Patient otherwise denies weakness, difficulty ambulating, chest pain shortness of breath nausea vomiting diarrhea constipation headache fevers or chills.  Objective: Vitals:   09/14/20 2101 09/15/20 0104 09/15/20 0635 09/15/20 1300  BP: (!) 137/98 128/87 (!) 141/97 (!) 146/84  Pulse: 87 83 69 74  Resp: 18 16 16 16   Temp: 98.5 F (36.9 C) 98.4 F (36.9 C) 98.5 F (36.9 C) 97.8 F (36.6 C)  TempSrc: Oral Oral Oral Oral  SpO2: 100% 98% 100% 100%    Intake/Output Summary (Last 24 hours) at 09/16/2020 0744 Last data filed at 09/15/2020 1700 Gross per 24 hour  Intake 960 ml  Output 2300 ml  Net -1340 ml   There were no vitals filed for this visit.  Examination:  General:  Pleasantly resting in bed, No acute distress. HEENT:  Normocephalic atraumatic.  Sclerae nonicteric, noninjected.  Extraocular movements intact bilaterally. Neck:  Without mass or deformity.  Trachea is midline. Lungs:  Clear to auscultate bilaterally without rhonchi, wheeze, or rales. Heart:  Regular rate and rhythm.  Without murmurs, rubs, or gallops. Abdomen:  Soft, nontender, nondistended.  Without guarding or rebound. Extremities: Without cyanosis, clubbing, edema, or obvious deformity. Vascular:  Dorsalis pedis and posterior tibial pulses palpable  bilaterally. Skin:  Warm and dry, no erythema, no ulcerations.   Data Reviewed: I have personally reviewed following labs and imaging studies  CBC: Recent Labs  Lab 09/13/20 1023 09/15/20 0433 09/16/20 0429  WBC 5.8 6.8 6.4  NEUTROABS 3.7  --   --   HGB 13.1 13.1 13.2  HCT 42.5 42.4 41.9  MCV 83.3 83.1 82.3  PLT 189 194 198   Basic Metabolic Panel: Recent Labs  Lab 09/13/20 1023 09/15/20 0433 09/16/20 0429  NA 138 137 137  K 4.3 3.6 3.5  CL 107 106 106  CO2 23 22 24   GLUCOSE 97 108* 100*  BUN 16 16 16   CREATININE 1.07 0.89 0.91  CALCIUM 9.2 9.1 9.1   GFR: Estimated Creatinine Clearance: 181.9 mL/min (by C-G formula based on SCr of 0.91 mg/dL). Liver Function Tests: Recent Labs  Lab 09/13/20 1023 09/15/20 0433 09/16/20 0429  AST 16 10* 15  ALT 33 26 24  ALKPHOS 84 76 71  BILITOT 0.7 0.7 0.6  PROT 7.4 6.7 6.6  ALBUMIN 4.8 4.0 3.7   No results for input(s): LIPASE, AMYLASE in the last 168 hours. No results for input(s): AMMONIA in the last 168 hours. Coagulation Profile: No results for input(s): INR, PROTIME in the last 168 hours. Cardiac Enzymes: No results for input(s): CKTOTAL, CKMB, CKMBINDEX, TROPONINI in the last 168 hours. BNP (last 3 results) No results for input(s): PROBNP in the last 8760 hours. HbA1C: No results for input(s): HGBA1C in the last 72 hours. CBG: No results for input(s): GLUCAP in the last 168  hours. Lipid Profile: No results for input(s): CHOL, HDL, LDLCALC, TRIG, CHOLHDL, LDLDIRECT in the last 72 hours. Thyroid Function Tests: No results for input(s): TSH, T4TOTAL, FREET4, T3FREE, THYROIDAB in the last 72 hours. Anemia Panel: No results for input(s): VITAMINB12, FOLATE, FERRITIN, TIBC, IRON, RETICCTPCT in the last 72 hours. Sepsis Labs: No results for input(s): PROCALCITON, LATICACIDVEN in the last 168 hours.  Recent Results (from the past 240 hour(s))  Urine culture     Status: Abnormal (Preliminary result)   Collection  Time: 09/13/20  4:56 PM   Specimen: Urine, Clean Catch  Result Value Ref Range Status   Specimen Description   Final    URINE, CLEAN CATCH Performed at [facility], [address] [place], [place] [address]    Special Requests   Final    NONE Performed at [facility], [address], [place], [place] [address]    Culture (A)  Final    >=100,000 COLONIES/mL PROTEUS MIRABILIS SUSCEPTIBILITIES TO FOLLOW Performed at [facility], [address] [place], [place] [address]    Report Status PENDING  Incomplete     Radiology Studies: No results found. Scheduled Meds: . amLODipine  10 mg Oral Daily  . apixaban  5 mg Oral BID  . asenapine  10 mg Sublingual TID  . benztropine  1 mg Oral BID  . cefdinir  300 mg Oral Q12H  . haloperidol  10 mg Oral BID  . melatonin  10 mg Oral QHS  . metFORMIN  500 mg Oral Q breakfast  . topiramate  25 mg Oral BID   Continuous Infusions:   LOS: 2 days   Time spent:  Azucena Fallen, DO [facility]  If 7PM-7AM, please contact night-coverage [email or web address]  09/16/2020, 7:44 AM

## 2020-09-16 NOTE — TOC Progression Note (Signed)
Transition of Care [facility]) - Progression Note    Patient Details  Name: Brandon [name] MRN: [id number] Date of Birth: 1991/01/16  Transition of Care [facility]) CM/SW Contact  Marina Goodell Phone Number: [phone] 09/16/2020, 2:45 PM  Clinical Narrative:     Patient medically stable for discharge.  Inpatient psychiatric admission bed search started.      Expected Discharge Plan and Services                                                 Social Determinants of Health (SDOH) Interventions    Readmission Risk Interventions No flowsheet data found.

## 2020-09-17 DIAGNOSIS — F201 Disorganized schizophrenia: Secondary | ICD-10-CM | POA: Diagnosis not present

## 2020-09-17 DIAGNOSIS — F79 Unspecified intellectual disabilities: Secondary | ICD-10-CM

## 2020-09-17 DIAGNOSIS — W19XXXA Unspecified fall, initial encounter: Secondary | ICD-10-CM | POA: Diagnosis not present

## 2020-09-17 NOTE — Consult Note (Signed)
[facility] Face to Face Pyschiatric Consultation   Reason for Consult:   auditory hallucinations Referring Physician:  Dr. Carma Leaven   Patient Identification: Brandon [name] MRN:  [id number] Principal Diagnosis: Falls Diagnosis:  Principal Problem:   Falls Active Problems:   HTN (hypertension)   Multiple sclerosis (HCC)   Left-sided weakness   Hallucinations   Disorganized schizophrenia (HCC)   Schizoaffective disorder, bipolar type (HCC)   Pulmonary embolism (HCC)   Unspecified intellectual disabilities   Acute metabolic encephalopathy   Total Time spent with patient: 30 minutes  Subjective:   Brandon [name] is a 30 y.o. male admitted with multiple falls, altered mental status, and new episodes of urinary incontinence.  Patient is seen and assessed by this nurse practitioner.  Patient states that he feels good. "  I usually have voices, however the meds you are or given me seems to be working better.  I am no longer paranoid as much as I used to be.  I do talk to myself, to help time go by.  I also hear voices, however I have much better control over them now."  He reports longstanding history of auditory hallucinations, paranoia, and psychosis at times.  He does report compliance with his medication management, and states he is receiving outpatient psychiatric services through [facility].  He denies any side effects and or adverse reactions from this medication at this time.  He reports the medication does appear to be improving his psychiatric symptoms.  He denies any suicidal homicidal ideations.  He also denies any significant concerns and or issues at today's visit.  HPI: Brandon [name] a 29 y.o.malewith medical history significant ofgait abnormality, ADHD, disorganized schizophrenia, intellectual disability, hypertension, prior stroke with left-sided weakness, multiple sclerosis, obesity, history of PE who presents after having multiple  falls and worsening auditory hallucinations from home. Lives with his mother who is primary historian given mental status changes this am. Multiple episodes over the past few weeks where pt supports himself to sit down and does not have a traumatic fall. Notable recent urinary incontinence and more chronic headaches as he has hit himself in the head in order to get the voices to stop - again increasing in frequency over the past few weeks. Only new medication/change over the past few weeks/months was topamax for headache.    During evaluation Brandon [name] is sitting up in bed.  He is alert/oriented x 3; calm/cooperative throughout assessment; and mood is congruent with affect and patient has had no behavior outburst while in the hospital; stating that he is ready to go home.  He does not appear to be responding to internal/external stimuli or delusional thoughts; although he is endorsing auditory hallucinations.  Patient denies suicidal/self-harm/homicidal ideation, and paranoia.  Patient answered question appropriately.  Patient agreed to follow-up with outpatient services, to include outpatient virtual therapy visits.   Past Psychiatric History: ADHD, Schizophrenia, Bipolar disorder  Risk to Self:  No Risk to Others:  Np Prior Inpatient Therapy:  Yes Prior Outpatient Therapy:  Yes  Past Medical History:  Past Medical History:  Diagnosis Date  . ADHD (attention deficit hyperactivity disorder)   . Bipolar 1 disorder (HCC)   . Chronic back pain   . Chronic constipation   . Chronic neck pain   . Hypertension   . Multiple sclerosis (HCC) 05/20/2013   left sided weakness, dysarthria  . Non-compliance   . Obesity   . Pulmonary embolism (HCC)   .  Schizophrenia (HCC)   . Stroke [facility])    left sided deficits - pt's mother denies this    Past Surgical History:  Procedure Laterality Date  . NO PAST SURGERIES    . None    . TOOTH EXTRACTION N/A 06/24/2019   Procedure: DENTAL  RESTORATION/EXTRACTION OF TEETH NUMBER ONE, SIXTEEN, SEVENTEEN, NINETEEN, THIRTY-TWO;  Surgeon: Ocie Doyne, DDS;  Location: [facility] OR;  Service: Oral Surgery;  Laterality: N/A;   Family History:  Family History  Problem Relation Age of Onset  . Diabetes Mother   . ADD / ADHD Brother    Family Psychiatric  History: See above, patient unaware Social History:  Social History   Substance and Sexual Activity  Alcohol Use Not Currently  . Alcohol/week: 0.0 standard drinks   Comment: "A little bit"      Social History   Substance and Sexual Activity  Drug Use Not Currently  . Types: Marijuana   Comment: Last used: unknown     Social History   Socioeconomic History  . Marital status: Single    Spouse name: Not on file  . Number of children: 0  . Years of education: 11th  . Highest education level: Not on file  Occupational History  . 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 Smoking status: Current Every Day Smoker    Packs/day: 0.25    Types: Cigarettes  . Smokeless tobacco: Never Used  . Tobacco comment: 2 cigarettes a day  Vaping Use  . Vaping Use: Never used  Substance and Sexual Activity  . Alcohol use: Not Currently    Alcohol/week: 0.0 standard drinks    Comment: "A little bit"   . Drug use: Not Currently    Types: Marijuana    Comment: Last used: unknown   . Sexual activity: Not on file  Other Topics Concern  . Not on file  Social History Narrative   Patient lives at home with his mother.   Disabled.   Education 11 th grade .   Right handed.   Caffeine - one cup daily soda.   Social Determinants of Health   Financial Resource Strain: Not on file  Food Insecurity: Not on file  Transportation Needs: Not on file  Physical Activity: Not on file  Stress: Not on file  Social Connections: Not on file   Additional Social History:    Allergies:  No Known Allergies  Labs:  Results for orders placed or  performed during the hospital encounter of 09/13/20 (from the past 48 hour(s))  CBC     Status: Abnormal   Collection Time: 09/16/20  4:29 AM  Result Value Ref Range   WBC 6.4 4.0 - 10.5 K/uL   RBC 5.09 4.22 - 5.81 MIL/uL   Hemoglobin 13.2 13.0 - 17.0 g/dL   HCT 91.9 16.6 - 06.0 %   MCV 82.3 80.0 - 100.0 fL   MCH 25.9 (L) 26.0 - 34.0 pg   MCHC 31.5 30.0 - 36.0 g/dL   RDW 04.5 99.7 - 74.1 %   Platelets 198 150 - 400 K/uL   nRBC 0.0 0.0 - 0.2 %    Comment: Performed at [facility], [address], [place], [place] [address]  Comprehensive metabolic panel     Status: Abnormal   Collection Time: 09/16/20  4:29 AM  Result Value Ref Range   Sodium 137 135 - 145 mmol/L   Potassium 3.5  3.5 - 5.1 mmol/L   Chloride 106 98 - 111 mmol/L   CO2 24 22 - 32 mmol/L   Glucose, Bld 100 (H) 70 - 99 mg/dL    Comment: Glucose reference range applies only to samples taken after fasting for at least 8 hours.   BUN 16 6 - 20 mg/dL   Creatinine, Ser 7.61 0.61 - 1.24 mg/dL   Calcium 9.1 8.9 - 60.7 mg/dL   Total Protein 6.6 6.5 - 8.1 g/dL   Albumin 3.7 3.5 - 5.0 g/dL   AST 15 15 - 41 U/L   ALT 24 0 - 44 U/L   Alkaline Phosphatase 71 38 - 126 U/L   Total Bilirubin 0.6 0.3 - 1.2 mg/dL   GFR, Estimated >37 >10 mL/min    Comment: (NOTE) Calculated using the CKD-EPI Creatinine Equation (2021)    Anion gap 7 5 - 15    Comment: Performed at [facility], [address], [place], [place] [address]    Medications:  Current Facility-Administered Medications  Medication Dose Route Frequency Provider Last Rate Last Admin  . acetaminophen (TYLENOL) tablet 650 mg  650 mg Oral Q6H PRN Synetta Fail, MD   650 mg at 09/15/20 1445   Or  . acetaminophen (TYLENOL) suppository 650 mg  650 mg Rectal Q6H PRN Synetta Fail, MD      . amLODipine (NORVASC) tablet 10 mg  10 mg Oral Daily Synetta Fail, MD   10 mg at 09/17/20 1031  . apixaban (ELIQUIS) tablet 5 mg  5  mg Oral BID Synetta Fail, MD   5 mg at 09/17/20 1032  . asenapine (SAPHRIS) sublingual tablet 10 mg  10 mg Sublingual TID Synetta Fail, MD   10 mg at 09/17/20 1034  . benztropine (COGENTIN) tablet 1 mg  1 mg Oral BID Synetta Fail, MD   1 mg at 09/17/20 1032  . haloperidol (HALDOL) tablet 10 mg  10 mg Oral BID Synetta Fail, MD   10 mg at 09/17/20 1042  . melatonin tablet 10 mg  10 mg Oral QHS Synetta Fail, MD   10 mg at 09/16/20 2026  . metFORMIN (GLUCOPHAGE-XR) 24 hr tablet 500 mg  500 mg Oral Q breakfast Synetta Fail, MD   500 mg at 09/17/20 1031  . polyethylene glycol (MIRALAX / GLYCOLAX) packet 17 g  17 g Oral Daily PRN Synetta Fail, MD      . topiramate (TOPAMAX) tablet 25 mg  25 mg Oral BID Synetta Fail, MD   25 mg at 09/17/20 1031  . traZODone (DESYREL) tablet 100 mg  100 mg Oral QHS PRN Synetta Fail, MD        Musculoskeletal: Strength & Muscle Tone: within normal limits Gait & Station: normal Patient leans: N/A  Psychiatric Specialty Exam: Physical Exam Constitutional:      General: He is not in acute distress.    Appearance: He is obese. He is not ill-appearing.  Pulmonary:     Effort: Pulmonary effort is normal.  Musculoskeletal:        General: Normal range of motion.     Cervical back: Normal range of motion.  Neurological:     Mental Status: He is alert and oriented to person, place, and time.  Psychiatric:        Attention and Perception: Attention normal. He perceives auditory hallucinations. He does not perceive visual hallucinations.  Mood and Affect: Mood and affect normal.        Speech: Speech normal.        Behavior: Behavior normal. Behavior is cooperative.        Thought Content: Thought content is not paranoid or delusional. Thought content does not include homicidal or suicidal ideation.        Cognition and Memory: Memory normal.        Judgment: Judgment is impulsive.     Review of  Systems  Neurological:       Multiple sclerosis  Psychiatric/Behavioral: Positive for hallucinations (Auditory). Negative for agitation. Self-injury: Bang head when mad  Sleep disturbance: Fair. Suicidal ideas: Denies. The patient is not nervous/anxious.        Patient states that he always hear voices they never go away.  States that currently voices are whispers.  Patient asked what makes the voices loud "When I talk crap to them"  All other systems reviewed and are negative.   Blood pressure (!) 147/112, pulse 64, temperature (!) 97.5 F (36.4 C), temperature source Oral, resp. rate 16, SpO2 100 %.There is no height or weight on file to calculate BMI.  General Appearance: Casual  Eye Contact:  Good  Speech:  Clear and Coherent and Normal Rate  Volume:  Normal  Mood:  "Good"  Affect:  Appropriate and Congruent  Thought Process:  Coherent, Goal Directed and Descriptions of Associations: Tangential  Orientation:  Full (Time, Place, and Person)  Thought Content:  WDL and Hallucinations: Auditory  Suicidal Thoughts:  No  Homicidal Thoughts:  No  Memory:  Immediate;   Good Recent;   Good  Judgement:  Intact  Insight:  Present  Psychomotor Activity:  Normal  Concentration:  Concentration: Fair and Attention Span: Fair  Recall:  Good  Fund of Knowledge:  Fair  Language:  Fair  Akathisia:  No  Handed:  Right  AIMS (if indicated):     Assets:  Communication Skills Desire for Improvement Housing Resilience Social Support  ADL's:  Intact  Cognition:  WNL  Sleep:        Treatment Plan Summary: Plan Psychiatrically clear to follow up with outpatient provider  -Patient is to discharge home on current psychiatric medication, no changes to be made at this time.  Patient may benefit from initiation of Ingrezza for management of tardive dyskinesia, in an outpatient setting. Spoke to patients mother and she agrees with plan. SHe states she is ok with his discharge and patient returning  home. We dicussed current plan for home health physical therapy as he remains high risk for falls.   -The above communication has been discussed via secure chat with Dr. Carma Leaven, and social work.   -Patient will need a referral for home health physical therapy, prior to discharging home.  Disposition:  Psychiatrically cleared No evidence of imminent risk to self or others at present.   Patient does not meet criteria for psychiatric inpatient admission. Supportive therapy provided about ongoing stressors. Discussed crisis plan, support from social network, calling 911, coming to the Emergency Department, and calling Suicide Hotline.   Maryagnes Amos, FNP 09/17/2020 11:48 AM

## 2020-09-17 NOTE — Progress Notes (Signed)
PT Cancellation Note  Patient Details Name: Brandon [name] MRN: [id number] DOB: August 28, 1990   Cancelled Treatment:      Pt agitated with security called to assist.  Not appropriate for PT at this time.  Will f/u as able.   Anise Salvo, PT Acute Rehab Services Pager [phone] [facility] [phone]   Rayetta Humphrey 09/17/2020, 2:36 PM

## 2020-09-17 NOTE — TOC Transition Note (Signed)
Transition of Care [facility]) - CM/SW Discharge Note   Patient Details  Name: Brandon [name] MRN: [id number] Date of Birth: 1991/01/10  [facility]) CM/SW Contact:  [name], LCSW Phone Number: 09/17/2020, 1:38 PM   Clinical Narrative:     CSW received referral for patient needing inpatient behavioral health, however patient has been cleared by psych today.  Patient will be going back home with his mother with medication changes.  CSW to sign off, no other needs, patient will continue going to [facility] behavioral health as an outpatient.   Final next level of care: Home/Self Care Barriers to Discharge: Barriers Resolved   Patient Goals and CMS Choice Patient states their goals for this hospitalization and ongoing recovery are:: To return back home with his mother. CMS Medicare.gov Compare Post Acute Care list provided to:: Patient Represenative (must comment) Choice offered to / list presented to : Parent  Discharge Placement  Patient discharging back home with his mother.                 Discharge Plan and Services  Mom's house                                   Social Determinants of Health (SDOH) Interventions     Readmission Risk Interventions No flowsheet data found.

## 2020-09-17 NOTE — Discharge Summary (Addendum)
Physician Discharge Summary  Brandon [name] MVH:[id number] DOB: [birth year] DOA: 09/13/2020  PCP: Maudie Flakes, FNP  Admit date: 09/13/2020 Discharge date: 09/17/2020  Admitted From: Home Disposition:  Home  Recommendations for Outpatient Follow-up:  1. Follow up with PCP in 1-2 weeks 2. Please obtain BMP/CBC in one week 3. Please follow up with psych as scheduled  Discharge Condition: Stable  CODE STATUS:  Full Diet recommendation: Low salt/low fat diet   Brief/Interim Summary: Brandon [name] a 29 y.o.malewith medical history significant ofgait abnormality, ADHD, disorganized schizophrenia, intellectual disability, hypertension, prior stroke with left-sided weakness, multiple sclerosis, obesity, history of PE who presents after having multiple falls and worsening auditory hallucinations from home. Lives with his mother who is primary historian given mental status changes this am. Multiple episodes over the past few weeks where pt supports himself to sit down and does not have a traumatic fall. Notable recent urinary incontinence and more chronic headaches as he has hit himself in the head in order to get the voices to stop - again increasing in frequency over the past few weeks. Only new medication/change over the past few weeks/months was topamax for headache.  Acute mental status changes  Acute ambulatory dysfunction with recurrent falls  Recurrent auditory hallucinations in the setting of disorganized schizophrenia with bipolar type  - Patient's 'falls' appear to be assisted insomuch that he does not have traumatic falls but catches himself before falling.  Regardless would recommend discussion with PCP for ongoing anticoagulation in the setting of increased fall risk and history of striking himself in the head. - Imaging limited at admission, neurology signed off, no recommendations for steroids which indicates this is very atypical for an MS flare presentation - no  deficits/weakness on evaluation since admission per myself/PT. - Psychiatry also consulted, given patient's profound history we appreciate insight and recommendations on medication management.  Previously recommending inpatient psychiatric admission, but now after re-evaluation agree to outpatient follow up. - No recent medication changes other than addition of tramadol for headaches - Currently patient is continued on Saphris, Cogentin, Haldol, Topamax - patient has been receiving Ocrevus injections in the outpatient setting per mother, last known injection December 2021 - No recurrent ambulation issues - no neuro follow up or repeat imaging indicated.  Questionable UTI, POA, resolved - Patient now more awake denies any recent symptoms but mother indicates he was having increased frequency over the past few days -Completed 3 days of cefdinir for presumed UTI  Headaches, multifactorial - Continue topamax - Headaches appear to be somewhat self-induced per mother, patient strikes himself in the head to "quiet the voices" - Would recommend discontinuation of Eliquis if patient remains high risk for traumatic head injury in the setting of falls and self inflicted injury  Hypertension - Continue home  Left-sided foot drop, chronic - No indication for current intervention  History of PE -Continue home Eliquis  Discharge Diagnoses:  Principal Problem:   Falls Active Problems:   HTN (hypertension)   Multiple sclerosis (HCC)   Left-sided weakness   Hallucinations   Disorganized schizophrenia (HCC)   Schizoaffective disorder, bipolar type (HCC)   Pulmonary embolism (HCC)   Unspecified intellectual disabilities   Acute metabolic encephalopathy    Discharge Instructions  Discharge Instructions    Diet - low sodium heart healthy   Complete by: As directed    Diet Carb Modified   Complete by: As directed    Increase activity slowly   Complete by: As directed  Allergies  as of 09/17/2020   No Known Allergies     Medication List    TAKE these medications   acetaminophen 500 MG tablet Commonly known as: TYLENOL Take 1 tablet (500 mg total) by mouth every 6 (six) hours as needed. What changed: reasons to take this   amLODipine 5 MG tablet Commonly known as: NORVASC Take 5 mg by mouth daily.   Asenapine Maleate 10 MG Subl Place 1 tablet (10 mg total) under the tongue in the morning, at noon, and at bedtime.   benztropine 1 MG tablet Commonly known as: COGENTIN Take 1 tablet (1 mg total) by mouth 2 (two) times daily.   docusate sodium 100 MG capsule Commonly known as: COLACE Take 400 mg by mouth at bedtime.   Eliquis 5 MG Tabs tablet Generic drug: apixaban Take 5 mg by mouth 2 (two) times daily.   haloperidol 10 MG tablet Commonly known as: HALDOL Take 1 tablet (10 mg total) by mouth 2 (two) times daily.   haloperidol decanoate 100 MG/ML injection Commonly known as: HALDOL DECANOATE Inject 2 mLs (200 mg total) into the muscle every 30 (thirty) days.   latanoprost 0.005 % ophthalmic solution Commonly known as: XALATAN Place 1 drop into both eyes at bedtime.   Melatonin 10 MG Caps Take 10 mg by mouth at bedtime.   metFORMIN 500 MG 24 hr tablet Commonly known as: GLUCOPHAGE-XR Take 500 mg by mouth daily with breakfast.   multivitamin with minerals Tabs tablet Take 1 tablet by mouth daily.   ocrelizumab in sodium chloride 0.9 % 500 mL CHANGE ::  Ocrevus 600mg  IV EVERY 8 (EIGHT) months.   topiramate 25 MG tablet Commonly known as: TOPAMAX Take 1 tablet (25 mg total) by mouth 2 (two) times daily.   traZODone 100 MG tablet Commonly known as: DESYREL Take 1 to 2 tablets at bedtime as needed for sleep. What changed:   how much to take  how to take this  when to take this  additional instructions       No Known Allergies  Consultations: Psych, neuro  Procedures/Studies: DG Ankle Complete Right  Result Date:  09/13/2020 CLINICAL DATA:  30 year old male with pain.  No known injury. EXAM: RIGHT ANKLE - COMPLETE 3+ VIEW COMPARISON:  None. FINDINGS: Bone mineralization is within normal limits. There is no evidence of fracture, dislocation, or joint effusion. There is no evidence of arthropathy or other focal bone abnormality. Soft tissues are unremarkable. IMPRESSION: Negative. Electronically Signed   By: Odessa FlemingH  Hall M.D.   On: 09/13/2020 10:19   CT Head Wo Contrast  Result Date: 09/13/2020 CLINICAL DATA:  30 year old male status post fall 2 days ago. Altered mental status. History of multiple sclerosis. EXAM: CT HEAD WITHOUT CONTRAST TECHNIQUE: Contiguous axial images were obtained from the base of the skull through the vertex without intravenous contrast. COMPARISON:  Brain MRI 07/17/2020 the, head CT 07/16/2020. FINDINGS: Brain: Stable cerebral volume, ex vacuo ventricular enlargement. Patchy and confluent white matter hypodensity greater in the right hemisphere appears stable. No superimposed midline shift, ventriculomegaly, mass effect, evidence of mass lesion, intracranial hemorrhage or evidence of cortically based acute infarction. Vascular: No suspicious intracranial vascular hyperdensity. Skull: No acute osseous abnormality identified. Sinuses/Orbits: Sinuses, tympanic cavities and mastoids are hyperplastic and clear. Other: Similar leftward gaze to the prior CT. No acute orbit or scalp soft tissue finding. IMPRESSION: 1. No acute intracranial abnormality or acute traumatic injury identified. 2. Stable advanced white matter disease and associated cerebral  volume loss Electronically Signed   By: Odessa Fleming M.D.   On: 09/13/2020 10:25   MR BRAIN WO CONTRAST  Result Date: 09/13/2020 CLINICAL DATA:  Multiple sclerosis EXAM: MRI HEAD WITHOUT CONTRAST TECHNIQUE: Multiplanar, multiecho pulse sequences of the brain and surrounding structures were obtained without intravenous contrast. COMPARISON:  None. FINDINGS: Motion  artifact is present. Brain: Patchy and confluent areas of T2 hyperintensity are again identified in the supratentorial periventricular greater than subcortical white matter. Burden does not appear to changed. Involvement of the posterior fossa is less apparent on this study likely on a technical basis. Prominence of the ventricles and sulci reflects stable parenchymal volume loss. Unchanged thinning of the corpus callosum. There is no acute infarction or intracranial hemorrhage. There is no intracranial mass, mass effect, or edema. There is no hydrocephalus or extra-axial fluid collection. Vascular: Major vessel flow voids at the skull base are preserved. Skull and upper cervical spine: Normal marrow signal is preserved. Sinuses/Orbits: Paranasal sinuses are aerated. Orbits are unremarkable. Other: Sella is unremarkable.  Mastoid air cells are clear. IMPRESSION: No apparent change in burden of chronic demyelination. Stable parenchymal volume loss. Electronically Signed   By: Guadlupe Spanish M.D.   On: 09/13/2020 14:13   MR CERVICAL SPINE WO CONTRAST  Result Date: 09/13/2020 CLINICAL DATA:  Multiple sclerosis EXAM: MRI CERVICAL SPINE WITHOUT CONTRAST TECHNIQUE: Multiplanar, multisequence MR imaging of the cervical spine was performed. No intravenous contrast was administered. COMPARISON:  07/17/2020 FINDINGS: Motion degraded. Alignment: Unchanged. Vertebrae: Vertebral body heights are stable. No apparent marrow edema. Cord: Poor evaluation of the cord due to artifact. T2 hyperintensity appears to be present at the C4 and C5 levels as before. No definite new abnormality. Posterior Fossa, vertebral arteries, paraspinal tissues: Brain reported separately. Otherwise no abnormality within above limitation. Disc levels: No degenerative stenosis identified. IMPRESSION: Significantly motion degraded with poor evaluation of the cord. Chronic demyelination at C4 and C5 is again identified. No definite new abnormality.  Electronically Signed   By: Guadlupe Spanish M.D.   On: 09/13/2020 14:19   DG Chest Port 1 View  Result Date: 09/13/2020 CLINICAL DATA:  Rule out infection EXAM: PORTABLE CHEST 1 VIEW COMPARISON:  07/16/2020 FINDINGS: The heart size and mediastinal contours are within normal limits. Both lungs are clear. The visualized skeletal structures are unremarkable. IMPRESSION: No active disease. Electronically Signed   By: Marlan Palau M.D.   On: 09/13/2020 11:09   DG Foot Complete Right  Result Date: 09/13/2020 CLINICAL DATA:  30 year old male with pain. No known injury. EXAM: RIGHT FOOT COMPLETE - 3+ VIEW COMPARISON:  Right ankle series today. FINDINGS: Bone mineralization is within normal limits. There is no evidence of fracture or dislocation. There is no evidence of arthropathy or other focal bone abnormality. Soft tissues are unremarkable. IMPRESSION: Negative. Electronically Signed   By: Odessa Fleming M.D.   On: 09/13/2020 10:20   DG Hip Unilat With Pelvis 2-3 Views Left  Result Date: 09/13/2020 CLINICAL DATA:  30 year old male with pain. No known injury. EXAM: DG HIP (WITH OR WITHOUT PELVIS) 2-3V LEFT COMPARISON:  Left hip series 04/19/2016. FINDINGS: Bone mineralization is within normal limits. Hip joint spaces appear stable and preserved. There is no evidence of hip fracture or dislocation. There is no evidence of arthropathy or other focal bone abnormality. SI joints appear normal. Stable, negative lower abdominal and pelvic visceral contours. IMPRESSION: Negative. Electronically Signed   By: Odessa Fleming M.D.   On: 09/13/2020 10:21  Subjective: No acute issues or events overnight.   Discharge Exam: Vitals:   09/16/20 2004 09/17/20 0438  BP: (!) 152/98 (!) 147/112  Pulse: 74 64  Resp: 16 16  Temp: 97.8 F (36.6 C) (!) 97.5 F (36.4 C)  SpO2: 100% 100%   Vitals:   09/15/20 1300 09/16/20 1419 09/16/20 2004 09/17/20 0438  BP: (!) 146/84 (!) 143/88 (!) 152/98 (!) 147/112  Pulse: 74 65 74 64   Resp: 16 17 16 16   Temp: 97.8 F (36.6 C) (!) 97.1 F (36.2 C) 97.8 F (36.6 C) (!) 97.5 F (36.4 C)  TempSrc: Oral  Oral Oral  SpO2: 100% 100% 100% 100%    General: Pt is alert, awake, not in acute distress Cardiovascular: RRR, S1/S2 +, no rubs, no gallops Respiratory: CTA bilaterally, no wheezing, no rhonchi Abdominal: Soft, NT, ND, bowel sounds + Extremities: no edema, no cyanosis    The results of significant diagnostics from this hospitalization (including imaging, microbiology, ancillary and laboratory) are listed below for reference.     Microbiology: Recent Results (from the past 240 hour(s))  Urine culture     Status: Abnormal   Collection Time: 09/13/20  4:56 PM   Specimen: Urine, Clean Catch  Result Value Ref Range Status   Specimen Description   Final    URINE, CLEAN CATCH Performed at [facility], [address], [place], [place]    Special Requests   Final    NONE Performed at [facility], [address], [place], [place]    Culture >=100,000 COLONIES/mL PROTEUS MIRABILIS (A)  Final   Report Status 09/16/2020 FINAL  Final   Organism ID, Bacteria PROTEUS MIRABILIS (A)  Final      Susceptibility   Proteus mirabilis - MIC*    AMPICILLIN <=2 SENSITIVE Sensitive     CEFAZOLIN <=4 SENSITIVE Sensitive     CEFEPIME <=0.12 SENSITIVE Sensitive     CEFTRIAXONE <=0.25 SENSITIVE Sensitive     CIPROFLOXACIN <=0.25 SENSITIVE Sensitive     GENTAMICIN <=1 SENSITIVE Sensitive     IMIPENEM 2 SENSITIVE Sensitive     NITROFURANTOIN RESISTANT Resistant     TRIMETH/SULFA <=20 SENSITIVE Sensitive     AMPICILLIN/SULBACTAM <=2 SENSITIVE Sensitive     PIP/TAZO <=4 SENSITIVE Sensitive     * >=100,000 COLONIES/mL PROTEUS MIRABILIS     Labs: BNP (last 3 results) No results for input(s): BNP in the last 8760 hours. Basic Metabolic Panel: Recent Labs  Lab 09/13/20 1023 09/15/20 0433 09/16/20 0429  NA 138 137  137  K 4.3 3.6 3.5  CL 107 106 106  CO2 23 22 24   GLUCOSE 97 108* 100*  BUN 16 16 16   CREATININE 1.07 0.89 0.91  CALCIUM 9.2 9.1 9.1   Liver Function Tests: Recent Labs  Lab 09/13/20 1023 09/15/20 0433 09/16/20 0429  AST 16 10* 15  ALT 33 26 24  ALKPHOS 84 76 71  BILITOT 0.7 0.7 0.6  PROT 7.4 6.7 6.6  ALBUMIN 4.8 4.0 3.7   No results for input(s): LIPASE, AMYLASE in the last 168 hours. No results for input(s): AMMONIA in the last 168 hours. CBC: Recent Labs  Lab 09/13/20 1023 09/15/20 0433 09/16/20 0429  WBC 5.8 6.8 6.4  NEUTROABS 3.7  --   --   HGB 13.1 13.1 13.2  HCT 42.5 42.4 41.9  MCV 83.3 83.1 82.3  PLT 189 194 198   Cardiac Enzymes: No results for input(s): CKTOTAL, CKMB,  CKMBINDEX, TROPONINI in the last 168 hours. BNP: Invalid input(s): POCBNP CBG: No results for input(s): GLUCAP in the last 168 hours. D-Dimer No results for input(s): DDIMER in the last 72 hours. Hgb A1c No results for input(s): HGBA1C in the last 72 hours. Lipid Profile No results for input(s): CHOL, HDL, LDLCALC, TRIG, CHOLHDL, LDLDIRECT in the last 72 hours. Thyroid function studies No results for input(s): TSH, T4TOTAL, T3FREE, THYROIDAB in the last 72 hours.  Invalid input(s): FREET3 Anemia work up No results for input(s): VITAMINB12, FOLATE, FERRITIN, TIBC, IRON, RETICCTPCT in the last 72 hours. Urinalysis    Component Value Date/Time   COLORURINE YELLOW 09/13/2020 1656   APPEARANCEUR HAZY (A) 09/13/2020 1656   LABSPEC 1.017 09/13/2020 1656   PHURINE 7.0 09/13/2020 1656   GLUCOSEU NEGATIVE 09/13/2020 1656   HGBUR NEGATIVE 09/13/2020 1656   HGBUR negative 01/14/2010 1451   BILIRUBINUR NEGATIVE 09/13/2020 1656   KETONESUR NEGATIVE 09/13/2020 1656   PROTEINUR NEGATIVE 09/13/2020 1656   UROBILINOGEN 1.0 03/13/2015 0223   NITRITE NEGATIVE 09/13/2020 1656   LEUKOCYTESUR LARGE (A) 09/13/2020 1656   Sepsis Labs Invalid input(s): PROCALCITONIN,  WBC,   LACTICIDVEN Microbiology Recent Results (from the past 240 hour(s))  Urine culture     Status: Abnormal   Collection Time: 09/13/20  4:56 PM   Specimen: Urine, Clean Catch  Result Value Ref Range Status   Specimen Description   Final    URINE, CLEAN CATCH Performed at [facility], [address], [place], [place] [address]    Special Requests   Final    NONE Performed at [facility], [address], [place], [place] [address]    Culture >=100,000 COLONIES/mL PROTEUS MIRABILIS (A)  Final   Report Status 09/16/2020 FINAL  Final   Organism ID, Bacteria PROTEUS MIRABILIS (A)  Final      Susceptibility   Proteus mirabilis - MIC*    AMPICILLIN <=2 SENSITIVE Sensitive     CEFAZOLIN <=4 SENSITIVE Sensitive     CEFEPIME <=0.12 SENSITIVE Sensitive     CEFTRIAXONE <=0.25 SENSITIVE Sensitive     CIPROFLOXACIN <=0.25 SENSITIVE Sensitive     GENTAMICIN <=1 SENSITIVE Sensitive     IMIPENEM 2 SENSITIVE Sensitive     NITROFURANTOIN RESISTANT Resistant     TRIMETH/SULFA <=20 SENSITIVE Sensitive     AMPICILLIN/SULBACTAM <=2 SENSITIVE Sensitive     PIP/TAZO <=4 SENSITIVE Sensitive     * >=100,000 COLONIES/mL PROTEUS MIRABILIS     Time coordinating discharge: Over 30 minutes  SIGNED:   Azucena Fallen, DO [facility] 09/17/2020, 12:01 PM Pager   If 7PM-7AM, please contact night-coverage [email or web address]

## 2020-09-17 NOTE — Care Management Important Message (Signed)
Important Message  Patient Details IM Letter given to the Patient. Name: Brandon [name] MRN: [id number] Date of Birth: 25-Jul-1990   Medicare Important Message Given:  Yes     Caren Macadam 09/17/2020, 11:31 AM

## 2020-09-19 ENCOUNTER — Other Ambulatory Visit: Payer: Self-pay | Admitting: Neurology

## 2020-09-21 DIAGNOSIS — Z79899 Other long term (current) drug therapy: Secondary | ICD-10-CM | POA: Insufficient documentation

## 2020-09-24 ENCOUNTER — Other Ambulatory Visit: Payer: Self-pay

## 2020-09-24 ENCOUNTER — Encounter (HOSPITAL_COMMUNITY): Payer: Self-pay | Admitting: Psychiatry

## 2020-09-24 ENCOUNTER — Telehealth (INDEPENDENT_AMBULATORY_CARE_PROVIDER_SITE_OTHER): Payer: Medicare Other | Admitting: Psychiatry

## 2020-09-24 DIAGNOSIS — F201 Disorganized schizophrenia: Secondary | ICD-10-CM | POA: Diagnosis not present

## 2020-09-24 DIAGNOSIS — F79 Unspecified intellectual disabilities: Secondary | ICD-10-CM | POA: Diagnosis not present

## 2020-09-24 MED ORDER — ASENAPINE MALEATE 10 MG SL SUBL: 10.0000 mg | SUBLINGUAL_TABLET | Freq: Three times a day (TID) | SUBLINGUAL | 1 refills | Status: DC

## 2020-09-24 MED ORDER — HALOPERIDOL DECANOATE 100 MG/ML IM SOLN
300.0000 mg | INTRAMUSCULAR | Status: DC
  Administered 2020-10-05: 300 mg via INTRAMUSCULAR

## 2020-09-24 MED ORDER — HALOPERIDOL DECANOATE 100 MG/ML IM SOLN: 300.0000 mg | INTRAMUSCULAR | 3 refills | Status: DC

## 2020-09-24 MED ORDER — TRAZODONE HCL 100 MG PO TABS: ORAL_TABLET | ORAL | 1 refills | Status: DC

## 2020-09-24 MED ORDER — BENZTROPINE MESYLATE 1 MG PO TABS: 1.0000 mg | ORAL_TABLET | Freq: Two times a day (BID) | ORAL | 1 refills | Status: DC

## 2020-09-24 MED ORDER — HALOPERIDOL 10 MG PO TABS: 10.0000 mg | ORAL_TABLET | Freq: Two times a day (BID) | ORAL | 1 refills | Status: DC

## 2020-09-24 NOTE — Progress Notes (Signed)
BH MD/PA/NP OP Progress Note  Virtual Visit via Video Note  I connected with Brandon [name] on 09/24/20 at  9:40 AM EDT by a video enabled telemedicine application and verified that I am speaking with the correct person using two identifiers.  Location: Patient: Home Provider: Clinic   I discussed the limitations of evaluation and management by telemedicine and the availability of in person appointments. The patient expressed understanding and agreed to proceed.  I provided 21 minutes of non-face-to-face time during this encounter.    09/24/2020 9:49 AM Brandon [name]  MRN:  [id number]  Chief Complaint: As per mom, " He is still constantly talking to himself during the day."   HPI: Patient was seen with his mother.  Mother reported that patient continues to hear voices and talk to himself pretty much throughout the day.  She stated that he does that during his groups as well and therapists at his groups have suggested may be increasing the dose of IM Haldol Decanoate that he receives. He has not had any aggressive outburst lately however his constant talking to himself gets unmanageable at times.  Mother stated that a few days ago his young nephew also pointed out that he needs to keep his volume down because he keeps talking to himself loudly. Mother stated that he has been sleeping much better after he started taking trazodone 200 mg at bedtime. Brandon [name] acknowledged hearing voices but then stated that he feels the voices are less frequent now.  However his mother did not agree with him and stated that she feels he still having the same frequency of hallucinations. Brandon [name] denied any thoughts of hurting himself or others.  He denied feeling paranoid.  He denied ideas of reference.  Both mother and patient were agreeable to increasing the dose of IM Haldol Decanoate to 300 mg from 200 mg/month.  He is continuing to take oral Haldol 10 mg twice a day along with Saphris 10 mg 3 times  daily and Cogentin 1 mg twice daily.  Trazodone 200 mg at bedtime was added beginning of this month at bedtime for sleep which is helped him.  Writer confirmed the next appointments for his injection dose as well as for follow-up with the Clinical research associatewriter.  Past meds tried- Risperidone, Invega, Abilify, Olanzapine, Clozapine (in 2019), Doxepin, Trazodone.   Visit Diagnosis:    ICD-10-CM   1. 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 ADHD (attention deficit hyperactivity disorder)   . Bipolar 1 disorder (HCC)   . Chronic back pain   . Chronic constipation   . Chronic neck pain   . Hypertension   . Multiple sclerosis (HCC) 05/20/2013   left sided weakness, dysarthria  . Non-compliance   . Obesity   . Pulmonary embolism (HCC)   . Schizophrenia (HCC)   . Stroke [facility](HCC)    left sided deficits - pt's mother denies this    Past Surgical History:  Procedure Laterality Date  . NO PAST SURGERIES    . None    .  TOOTH EXTRACTION N/A 06/24/2019   Procedure: DENTAL RESTORATION/EXTRACTION OF TEETH NUMBER ONE, SIXTEEN, SEVENTEEN, NINETEEN, THIRTY-TWO;  Surgeon: Ocie Doyne, DDS;  Location: [facility] OR;  Service: Oral Surgery;  Laterality: N/A;    Family Psychiatric History: Brother- ADHD  Family History:  Family History  Problem Relation Age of Onset  . Diabetes Mother   . ADD / ADHD Brother     Social History:  Social History   Socioeconomic History  . Marital status: Single    Spouse name: Not on file  . Number of children: 0  .  Years of education: 11th  . Highest education level: Not on file  Occupational History  . 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 Smoking status: Current Every Day Smoker    Packs/day: 0.25    Types: Cigarettes  . Smokeless tobacco: Never Used  . Tobacco comment: 2 cigarettes a day  Vaping Use  . Vaping Use: Never used  Substance and Sexual Activity  . Alcohol use: Not Currently    Alcohol/week: 0.0 standard drinks    Comment: "A little bit"   . Drug use: Not Currently    Types: Marijuana    Comment: Last used: unknown   . Sexual activity: Not on file  Other Topics Concern  . Not on file  Social History Narrative   Patient lives at home with his mother.   Disabled.   Education 11 th grade .   Right handed.   Caffeine - one cup daily soda.   Social Determinants of Health   Financial Resource Strain: Not on file  Food Insecurity: Not on file  Transportation Needs: Not on file  Physical Activity: Not on file  Stress: Not on file  Social Connections: Not on file    Allergies: No Known Allergies  Metabolic Disorder Labs: Lab Results  Component Value Date   HGBA1C 5.9 (H) 04/30/2020   MPG 123 04/30/2020   MPG 122.63 07/31/2017   Lab Results  Component Value Date   PROLACTIN 13.6 04/30/2020   Lab Results  Component Value Date   CHOL 141 04/30/2020   TRIG 134 04/30/2020   HDL 37 (L) 04/30/2020   CHOLHDL 3.8 04/30/2020   VLDL 27 04/30/2020   LDLCALC 77 04/30/2020   LDLCALC 70 01/29/2011   Lab Results  Component Value Date   TSH 2.636 07/18/2020   TSH 4.628 (H) 04/30/2020    Therapeutic Level Labs: Lab Results  Component Value Date   LITHIUM <0.06 (L) 01/25/2018   LITHIUM <0.06 (L) 04/20/2015   Lab Results  Component Value Date   VALPROATE <10 (L) 07/30/2017   VALPROATE 31 (L) 07/24/2017   No components found for:  CBMZ  Current Medications: Current Outpatient Medications  Medication Sig  Dispense Refill  . haloperidol decanoate (HALDOL DECANOATE) 100 MG/ML injection Inject 3 mLs (300 mg total) into the muscle every 28 (twenty-eight) days. 3 mL 3  . acetaminophen (TYLENOL) 500 MG tablet Take 1 tablet (500 mg total) by mouth every 6 (six) hours as needed. (Patient taking differently: Take 500 mg by mouth every 6 (six) hours as needed for moderate pain.) 30 tablet 0  . amLODipine (NORVASC) 5 MG tablet Take 5 mg by mouth daily.    Marland Kitchen apixaban (ELIQUIS) 5 MG TABS tablet Take 5 mg by mouth 2 (two) times daily.     . Asenapine Maleate 10 MG SUBL Place 1 tablet (10 mg total)  under the tongue in the morning, at noon, and at bedtime. 90 tablet 1  . benztropine (COGENTIN) 1 MG tablet Take 1 tablet (1 mg total) by mouth 2 (two) times daily. 60 tablet 1  . docusate sodium (COLACE) 100 MG capsule Take 400 mg by mouth at bedtime.     . haloperidol (HALDOL) 10 MG tablet Take 1 tablet (10 mg total) by mouth 2 (two) times daily. 60 tablet 1  . latanoprost (XALATAN) 0.005 % ophthalmic solution Place 1 drop into both eyes at bedtime.    . Melatonin 10 MG CAPS Take 10 mg by mouth at bedtime. 30 capsule 1  . metFORMIN (GLUCOPHAGE-XR) 500 MG 24 hr tablet Take 500 mg by mouth daily with breakfast.    . Multiple Vitamin (MULTIVITAMIN WITH MINERALS) TABS tablet Take 1 tablet by mouth daily.    Marland Kitchen ocrelizumab in sodium chloride 0.9 % 500 mL CHANGE ::  Ocrevus 600mg  IV EVERY 8 (EIGHT) months. 1 each 2  . topiramate (TOPAMAX) 25 MG tablet TAKE 1 TABLET (25 MG TOTAL) BY MOUTH TWO (TWO) TIMES DAILY. 60 tablet 0  . traZODone (DESYREL) 100 MG tablet Take 1 to 2 tablets at bedtime as needed for sleep. 60 tablet 1   Current Facility-Administered Medications  Medication Dose Route Frequency Provider Last Rate Last Admin  . [START ON 10/05/2020] haloperidol decanoate (HALDOL DECANOATE) 100 MG/ML injection 300 mg  300 mg Intramuscular Q30 days 12/05/2020, MD          Psychiatric Specialty Exam: Review of Systems   There were no vitals taken for this visit.There is no height or weight on file to calculate BMI.  General Appearance: Fairly groomed, appears to be internally preoccupied  Eye Contact:  Fair  Speech:  Slow and monotonus  Volume:  Normal  Mood:  Anxious  Affect:  Restricted  Thought Process:  Goal Directed and Descriptions of Associations: Intact, internally preoccupied  Orientation:  Full (Time, Place, and Person)  Thought Content: Illogical and Hallucinations: Auditory - at baseline  Suicidal Thoughts:  No  Homicidal Thoughts:  No  Memory:  Immediate;   Good Recent;   Good  Judgement:  Fair  Insight:  Fair  Psychomotor Activity:  Normal  Concentration:  Concentration: Good and Attention Span: Fair  Recall:  Zena Amos of Knowledge: Poor  Language: Good  Akathisia:  Negative  Handed:  Right  AIMS (if indicated): Unable to administer due to telemedicine visit.  Assets:  Communication Skills Desire for Improvement Financial Resources/Insurance Housing Social Support  ADL's:  Intact  Cognition: WNL  Sleep:  Fair, improved after being started on trazodone 200 mg at bedtime     Assessment and Plan: Patient continues to engage in long conversations with himself which has been hard to manage as per the family.  He continues to talk to himself even in the groups that he attends almost daily.  His mother asked if his dose of Haldol Decanoate can be increased to see if that would help with the auditory hallucinations.  Patient stated that he feels the hallucinations have reduced however his mother does not agree.  We will increase the dose of IM Haldol Decanoate to 300 mg every month, next dose is due on April 1.   1. Disorganized schizophrenia (HCC)  - Increase haloperidol decanoate (HALDOL DECANOATE) 100 MG/ML injection; Inject 3 mLs (300 mg total) into the muscle every 28 (twenty-eight) days.  Dispense: 3 mL; Refill: 3 - haloperidol (HALDOL) 10 MG  tablet; Take 1 tablet (10 mg  total) by mouth 2 (two) times daily.  Dispense: 60 tablet; Refill: 1 - Asenapine Maleate 10 MG SUBL; Place 1 tablet (10 mg total) under the tongue in the morning, at noon, and at bedtime.  Dispense: 90 tablet; Refill: 1 - traZODone (DESYREL) 100 MG tablet; Take 1 to 2 tablets at bedtime as needed for sleep.  Dispense: 60 tablet; Refill: 1 - benztropine (COGENTIN) 1 MG tablet; Take 1 tablet (1 mg total) by mouth 2 (two) times daily.  Dispense: 60 tablet; Refill: 1   2. Unspecified intellectual disabilities    Return back to clinic April 1 for next dose of monthly injection.   Follow-up with writer on May 2nd and also receive next IM Haldol dec. Dose that day. Continue group therapy.    Zena Amos, MD 09/24/2020, 9:49 AM

## 2020-09-26 MED FILL — LATANOPROST 0.005% OPTH SOL: 0.005 | 75 days supply | Qty: 8 | Fill #2

## 2020-09-27 ENCOUNTER — Other Ambulatory Visit (HOSPITAL_BASED_OUTPATIENT_CLINIC_OR_DEPARTMENT_OTHER): Payer: Self-pay

## 2020-10-05 ENCOUNTER — Other Ambulatory Visit: Payer: Self-pay

## 2020-10-05 ENCOUNTER — Encounter (HOSPITAL_COMMUNITY): Payer: Self-pay

## 2020-10-05 ENCOUNTER — Ambulatory Visit (INDEPENDENT_AMBULATORY_CARE_PROVIDER_SITE_OTHER): Payer: Medicare Other | Admitting: *Deleted

## 2020-10-05 VITALS — BP 124/82 | HR 74 | Ht 73.0 in | Wt 320.0 lb

## 2020-10-05 DIAGNOSIS — F201 Disorganized schizophrenia: Secondary | ICD-10-CM

## 2020-10-05 NOTE — Progress Notes (Signed)
Patient arrived with [facility] Mother for  haloperidol decanoate (HALDOL DECANOATE) 100 MG/ML injection. Patient tolerated injection well in Left Arm. No HI/SI NOR AH/VH. Patient's gait is getting worse than usual.May need to refer out to a ACT team.

## 2020-10-08 DIAGNOSIS — M26622 Arthralgia of left temporomandibular joint: Secondary | ICD-10-CM | POA: Insufficient documentation

## 2020-10-08 DIAGNOSIS — H9202 Otalgia, left ear: Secondary | ICD-10-CM | POA: Insufficient documentation

## 2020-10-09 ENCOUNTER — Telehealth (HOSPITAL_COMMUNITY): Payer: Self-pay | Admitting: *Deleted

## 2020-10-16 ENCOUNTER — Telehealth (HOSPITAL_COMMUNITY): Payer: Self-pay | Admitting: *Deleted

## 2020-10-16 NOTE — Telephone Encounter (Signed)
Opened a second time in error. 

## 2020-10-16 NOTE — Telephone Encounter (Signed)
I called to speak with patients mom, Rhunnett to get information from her to help coordinate his care, esp his injection. Due to his difficulty walking and his large size, it is more difficult for his family to bring him here and then ambulate to the clinic for his shot. He doesn't seem appropriate for ACT, as much of his difficulty is related to health dx and I dont see a goal for him related to his mental health for an ACT to assist him with. Per mom he has an appt on 4/18 with [facility] to see if more can be done re his gait. He does have MS. Also, mom says he attends a program M-F called [facility], reached out to them to see if there was a service there where they could give him his injection, but they dont have medical personnel. They can however help him to move to a higher level of care. Mom says she is working with a IT trainer thru [facility] to move to an ALF. Will reach out to the care coordinator for more help and information and call Rhunnett back on fri with any updates.

## 2020-10-16 NOTE — Telephone Encounter (Signed)
Writer called [facility] to speak with Brandon [name], name I got from care coordinator to get information on innovations waiver and what it is and who can do one. Her hours per her voice mail are three days a week from 8-12 noon. Left her a message to call me back as  I called at 1 pm.

## 2020-10-17 ENCOUNTER — Other Ambulatory Visit: Payer: Self-pay | Admitting: Neurology

## 2020-10-17 ENCOUNTER — Other Ambulatory Visit (HOSPITAL_COMMUNITY): Payer: Self-pay | Admitting: Psychiatry

## 2020-10-17 ENCOUNTER — Telehealth (HOSPITAL_COMMUNITY): Payer: Self-pay | Admitting: *Deleted

## 2020-10-17 DIAGNOSIS — F201 Disorganized schizophrenia: Secondary | ICD-10-CM

## 2020-10-17 NOTE — Telephone Encounter (Signed)
Rhunneta, patients mom called this am to report [facility] where patient attends a day program m-f is able to give patient his monthly injection. I called and spoke to front desk staff yesterday and they said they do not do that service. I called and spoke with Ms Marlene Bast, director of [facility] and she confirmed moms report that they can and they will be happy to take over that part of his care. She will fax to me tomorrow a release of information once patient arrives tomorrow and can sign the release. Ms Marlene Bast is requesting from [place] history of his injections and his last notes.

## 2020-10-18 ENCOUNTER — Telehealth (HOSPITAL_COMMUNITY): Payer: Self-pay | Admitting: *Deleted

## 2020-10-18 NOTE — Telephone Encounter (Signed)
Received a signed release of information per [facility] that patient had signed requesting shot record and injection schedule. Faxed back to them a hand written list of his shots he has gotten here, starting with first shot on 06/06/20 and the last one to day which was 10/06/19. Also, sent the note from nurse correlating to each injection. Intention is to transfer his injection services to [facility]. I have also called twice to his care coordinator at [facility] and twice to Lona Kettle to get information from either of them on what an [employer] is and who can do it and what its for but I have not heard back from either person yet.

## 2020-10-23 ENCOUNTER — Telehealth (HOSPITAL_COMMUNITY): Payer: Self-pay | Admitting: *Deleted

## 2020-10-23 NOTE — Telephone Encounter (Signed)
Faxed and then called to verify Ms Brandon [name] with [name] received the paperwork I faxed re his injection history. Ms Brandon [name] had faxed over the request along with the release of information for the information. Called for a second time and left a message for his care coordinator with [name] and waiting for a response.

## 2020-10-24 ENCOUNTER — Telehealth (HOSPITAL_COMMUNITY): Payer: Self-pay | Admitting: *Deleted

## 2020-10-24 NOTE — Telephone Encounter (Signed)
Call from Rhunnet, patients mom asking for an update on innovations waiver and Francisca getting his Haldol Injection thru [facility]. I have been unable to reach his case coordinator, left message with his manager earlier today re my inability to reach him. I want to speak with coordinator to understand if we need to be involved with the waiver which I am unfamiliar with and also to see if Barbara Cower, the coordinator is OK with patient being moved to [facility] for his LAI. Minutes after I spoke with Rhunnet, Barbara Cower called me and the above was discussed. He is fine with [facility] giving his medicines and there is a team of people that assess for the innovations waiver that has to do with money and funding following him to his group home type living arrangement. Mom has found the right group home for him but waiting on this waiver. Our only role in this waiver is to make our medical records available to them to help with the decision. I will call Ms Wilhemena Durie at [facility] to get a transfer date from them that will end his relationship here with [place] and move him there. I told mom I would call her with update by fri.

## 2020-10-25 ENCOUNTER — Telehealth (HOSPITAL_COMMUNITY): Payer: Self-pay | Admitting: *Deleted

## 2020-10-25 NOTE — Telephone Encounter (Signed)
Spoke with Ms Marlene Bast with [facility] to solidify start date for patient to transfer his monthly injections and psychiatric follow up with them  They are ready to start now, his next shot is due on 5/2 and they can give him that injection if I bring his Haldol D medicine to them, I have two months worth here. They are familiar with having medicines delivered to them per peoples pharmacies in the future. They will refer him to the psychiatrist they use once he starts getting his shots there. I will take his medicine to [facility] on Monday the 25 th of April.

## 2020-10-26 ENCOUNTER — Telehealth (HOSPITAL_COMMUNITY): Payer: Self-pay | Admitting: *Deleted

## 2020-10-26 NOTE — Telephone Encounter (Signed)
Writer called and spoke at length with patients mom, Brandon [name] with update on him being moved to [facility] for his psychiatric care as well as continuing with his day programming. I will take the medicine I have of his to them on Monday and they will take over giving him his injections starting with his next shot due on 5/2. [facility] will also coordinate his psychiatric care with the clinic they have a contract with for psychiatrist to see him routinely. Additionally shared with her what [facility] care coordinator with [facility] shared with me thurs and that is there is a Product manager responsible for evaluating him for that waiver which if he qualifies gives him more money and services for his care. The waiver is not done by [place] and our only roll is to make our medical records available when they are requested by the team as part of the information to consider in their eval. Mom expressed her understanding.

## 2020-10-29 ENCOUNTER — Telehealth (HOSPITAL_COMMUNITY): Payer: Self-pay | Admitting: *Deleted

## 2020-10-29 NOTE — Telephone Encounter (Signed)
Took patients medicines to [facility] as discussed so going forward they can give him his shots and follow him for his psychiatric care. I cancelled his appt with Dr Evelene Croon here for 5/2 as they will now be his provider. Brandon [name], staff member 6 vials of Haldol 100 mg for him. [name] was there at the day program and both Clinical research associate and [facility] CME were able to tell him good bye and explain he would be getting all his mental health care at [facility] in addition to being there for the day program.

## 2020-11-01 ENCOUNTER — Encounter: Payer: Self-pay | Admitting: Neurology

## 2020-11-01 ENCOUNTER — Other Ambulatory Visit: Payer: Self-pay

## 2020-11-01 ENCOUNTER — Ambulatory Visit (INDEPENDENT_AMBULATORY_CARE_PROVIDER_SITE_OTHER): Payer: Medicare Other | Admitting: Neurology

## 2020-11-01 VITALS — BP 146/98 | HR 80 | Ht 73.0 in | Wt 326.9 lb

## 2020-11-01 DIAGNOSIS — Z5181 Encounter for therapeutic drug level monitoring: Secondary | ICD-10-CM

## 2020-11-01 DIAGNOSIS — R410 Disorientation, unspecified: Secondary | ICD-10-CM

## 2020-11-01 DIAGNOSIS — G35 Multiple sclerosis: Secondary | ICD-10-CM

## 2020-11-01 DIAGNOSIS — R269 Unspecified abnormalities of gait and mobility: Secondary | ICD-10-CM

## 2020-11-01 DIAGNOSIS — G35D Multiple sclerosis, unspecified: Secondary | ICD-10-CM

## 2020-11-01 NOTE — Progress Notes (Signed)
Reason for visit: Multiple sclerosis, gait disorder, schizophrenia  Brandon [name] is an 30 y.o. male  History of present illness:  Brandon [name] is a 30 year old right-handed black male with a history of multiple sclerosis, schizophrenia, and a gait disorder.  The patient has had a decline in his cognitive and physical capabilities within the last 3 months according to his mother who comes with him today.  He is having increasing frequency of hallucinations, within the last month his haloperidol has been increased.  He has had a decline in his ability to ambulate, he is having weakness of the axial muscles, not able to sit up straight, he will lean backwards.  He has had occasional stumbles or falls.  The patient was in the hospital on 13 September 2020, MRI of the brain and cervical spine were done without definite change seen, but the studies were of poor quality due to excessive movement artifact.  The patient returns here for evaluation, he remains on Ocrevus.   Past Medical History:  Diagnosis Date  . ADHD (attention deficit hyperactivity disorder)   . Bipolar 1 disorder (HCC)   . Chronic back pain   . Chronic constipation   . Chronic neck pain   . Hypertension   . Multiple sclerosis (HCC) 05/20/2013   left sided weakness, dysarthria  . Non-compliance   . Obesity   . Pulmonary embolism (HCC)   . Schizophrenia (HCC)   . Stroke [facility])    left sided deficits - pt's mother denies this    Past Surgical History:  Procedure Laterality Date  . NO PAST SURGERIES    . None    . TOOTH EXTRACTION N/A 06/24/2019   Procedure: DENTAL RESTORATION/EXTRACTION OF TEETH NUMBER ONE, SIXTEEN, SEVENTEEN, NINETEEN, THIRTY-TWO;  Surgeon: Ocie Doyne, DDS;  Location: [facility] OR;  Service: Oral Surgery;  Laterality: N/A;    Family History  Problem Relation Age of Onset  . Diabetes Mother   . ADD / ADHD Brother     Social history:  reports that he has been smoking cigarettes. He has been smoking about  0.25 packs per day. He has never used smokeless tobacco. He reports previous alcohol use. He reports previous drug use. Drug: Marijuana.   No Known Allergies  Medications:  Prior to Admission medications   Medication Sig Start Date End Date Taking? Authorizing Provider  acetaminophen (TYLENOL) 500 MG tablet Take 1 tablet (500 mg total) by mouth every 6 (six) hours as needed. Patient taking differently: Take 500 mg by mouth every 6 (six) hours as needed for moderate pain. 06/21/20  Yes Fayrene Helper, PA-C  amLODipine (NORVASC) 5 MG tablet Take 5 mg by mouth daily. 09/06/20  Yes [provider]  apixaban (ELIQUIS) 5 MG TABS tablet Take 5 mg by mouth 2 (two) times daily.    Yes [provider]  Asenapine Maleate 10 MG SUBL Place 1 tablet (10 mg total) under the tongue in the morning, at noon, and at bedtime. 09/24/20  Yes Zena Amos, MD  benztropine (COGENTIN) 1 MG tablet Take 1 tablet (1 mg total) by mouth 2 (two) times daily. 09/24/20  Yes Zena Amos, MD  docusate sodium (COLACE) 100 MG capsule Take 400 mg by mouth at bedtime.    Yes [provider]  haloperidol (HALDOL) 10 MG tablet Take 1 tablet (10 mg total) by mouth 2 (two) times daily. 09/24/20  Yes Zena Amos, MD  haloperidol decanoate (HALDOL DECANOATE) 100 MG/ML injection Inject 3 mLs (300  mg total) into the muscle every 28 (twenty-eight) days. 09/24/20  Yes Zena Amos, MD  latanoprost (XALATAN) 0.005 % ophthalmic solution Place 1 drop into both eyes at bedtime.   Yes [provider]  Melatonin 10 MG CAPS TAKE ONE TABLET BY MOUTH AT BEDTIME 10/17/20  Yes Toy Cookey E, NP  metFORMIN (GLUCOPHAGE-XR) 500 MG 24 hr tablet Take 500 mg by mouth daily with breakfast.   Yes [provider]  Multiple Vitamin (MULTIVITAMIN WITH MINERALS) TABS tablet Take 1 tablet by mouth daily.   Yes [provider]  ocrelizumab in sodium chloride 0.9 % 500 mL CHANGE ::  Ocrevus 600mg  IV EVERY 8 (EIGHT)  months. 11/10/19  Yes 01/10/20, NP  topiramate (TOPAMAX) 25 MG tablet TAKE 1 TABLET (25 MG TOTAL) BY MOUTH TWO (TWO) TIMES DAILY. 10/17/20  Yes 10/19/20, NP  traZODone (DESYREL) 100 MG tablet Take 1 to 2 tablets at bedtime as needed for sleep. 09/24/20  Yes 09/26/20, MD  propranolol (INDERAL) 10 MG tablet Take 1 tablet (10 mg total) by mouth 2 (two) times daily. 05/03/15 01/03/20  01/05/20, NP    ROS:  Out of a complete 14 system review of symptoms, the patient complains only of the following symptoms, and all other reviewed systems are negative.  Hallucinations, confusion Walking difficulty Weakness  Blood pressure (!) 146/98, pulse 80, height 6\' 1"  (1.854 m), weight (!) 326 lb 14.4 oz (148.3 kg).  Physical Exam  General: The patient is alert and cooperative at the time of the examination.  The patient is markedly obese.  Skin: No significant peripheral edema is noted.   Neurologic Exam  Mental status: The patient is alert and oriented x 3 at the time of the examination.    Cranial nerves: Facial symmetry is present. Speech is normal, no aphasia or dysarthria is noted. Extraocular movements are full. Visual fields are full.  The patient has a flat affect, he is hallucinating frequently.  Motor: The patient has good strength in all 4 extremities.  Sensory examination: Soft touch sensation is symmetric on the face, arms, and legs.  Coordination: The patient has good finger-nose-finger and heel-to-shin bilaterally.  Gait and station: The patient has the ability to walk independently, but gait is wide-based and unsteady.  Tandem gait was not attempted.  Reflexes: Deep tendon reflexes are symmetric.   MRI brain 09/13/20:  IMPRESSION: No apparent change in burden of chronic demyelination. Stable parenchymal volume loss.  * MRI scan images were reviewed online. I agree with the written report.   MRI cervical 09/13/20:  IMPRESSION: Significantly motion  degraded with poor evaluation of the cord. Chronic demyelination at C4 and C5 is again identified. No definite new abnormality.    Assessment/Plan:  1.  Multiple sclerosis  2.  Schizophrenia  3.  Gait disorder  The patient is worsened with his hallucinations which are more frequent, and he has had a decline in physical functioning.  We will send him for physical therapy, he will have blood work today.  He will have an EEG study done.  He will follow-up in 4 months.  11/13/20 MD 11/01/2020 2:03 PM  [facility] [address] [place], [address] [place]  Phone [phone] Fax [phone]

## 2020-11-02 LAB — COMPREHENSIVE METABOLIC PANEL
ALT: 25 IU/L (ref 0–44)
AST: 14 IU/L (ref 0–40)
Albumin/Globulin Ratio: 2.6 — ABNORMAL HIGH (ref 1.2–2.2)
Albumin: 4.9 g/dL (ref 4.1–5.2)
Alkaline Phosphatase: 95 IU/L (ref 44–121)
BUN/Creatinine Ratio: 14 (ref 9–20)
BUN: 14 mg/dL (ref 6–20)
Bilirubin Total: 0.2 mg/dL (ref 0.0–1.2)
CO2: 22 mmol/L (ref 20–29)
Calcium: 9.5 mg/dL (ref 8.7–10.2)
Chloride: 105 mmol/L (ref 96–106)
Creatinine, Ser: 1.03 mg/dL (ref 0.76–1.27)
Globulin, Total: 1.9 g/dL (ref 1.5–4.5)
Glucose: 75 mg/dL (ref 65–99)
Potassium: 4.4 mmol/L (ref 3.5–5.2)
Sodium: 142 mmol/L (ref 134–144)
Total Protein: 6.8 g/dL (ref 6.0–8.5)
eGFR: 101 mL/min/{1.73_m2} (ref >59)

## 2020-11-02 LAB — CBC WITH DIFFERENTIAL/PLATELET
Basophils Absolute: 0.1 10*3/uL (ref 0.0–0.2)
Basos: 1 %
EOS (ABSOLUTE): 0.3 10*3/uL (ref 0.0–0.4)
Eos: 4 %
Hematocrit: 43.2 % (ref 37.5–51.0)
Hemoglobin: 13.4 g/dL (ref 13.0–17.7)
Immature Grans (Abs): 0 10*3/uL (ref 0.0–0.1)
Immature Granulocytes: 0 %
Lymphocytes Absolute: 2 10*3/uL (ref 0.7–3.1)
Lymphs: 31 %
MCH: 25.5 pg — ABNORMAL LOW (ref 26.6–33.0)
MCHC: 31 g/dL — ABNORMAL LOW (ref 31.5–35.7)
MCV: 82 fL (ref 79–97)
Monocytes Absolute: 0.4 10*3/uL (ref 0.1–0.9)
Monocytes: 6 %
Neutrophils Absolute: 3.7 10*3/uL (ref 1.4–7.0)
Neutrophils: 58 %
Platelets: 209 10*3/uL (ref 150–450)
RBC: 5.25 x10E6/uL (ref 4.14–5.80)
RDW: 14.2 % (ref 11.6–15.4)
WBC: 6.5 10*3/uL (ref 3.4–10.8)

## 2020-11-02 LAB — IGG, IGA, IGM
IgA/Immunoglobulin A, Serum: 88 mg/dL — ABNORMAL LOW (ref 90–386)
IgG (Immunoglobin G), Serum: 810 mg/dL (ref 603–1613)
IgM (Immunoglobulin M), Srm: 9 mg/dL — ABNORMAL LOW (ref 20–172)

## 2020-11-05 ENCOUNTER — Ambulatory Visit (HOSPITAL_COMMUNITY): Payer: Medicare Other | Admitting: Psychiatry

## 2020-11-05 ENCOUNTER — Ambulatory Visit (HOSPITAL_COMMUNITY): Payer: Medicare Other

## 2020-11-06 ENCOUNTER — Telehealth: Payer: Self-pay | Admitting: Neurology

## 2020-11-06 MED ORDER — SODIUM CHLORIDE 0.9 % IV SOLN: 300.0000 mg | INTRAVENOUS | 2 refills | Status: DC

## 2020-11-06 NOTE — Telephone Encounter (Signed)
Ocrevus 300 mg IV every 6 months order given to Dr. Anne Hahn for review and signature.  Once order signed we can [facility] for scheduling in August.

## 2020-11-06 NOTE — Telephone Encounter (Signed)
I called the mother.  The blood work continues to show low IgM levels, IgG levels are normal, IgA levels are slightly low.   The patient is now getting Ocrevus 600 mg every 8 months, will reduce the dose to 300 mg and go back to every 6 months.  Continue to follow blood work twice a year.  The patient is getting his infusions at [facility], the last infusion was on 27 June 2020.  His next infusion is due in August.

## 2020-11-12 NOTE — Telephone Encounter (Signed)
Ocrevus 300mg  IV q 6 months beginning in August has been faxed to [facility]. Received a receipt of confirmation. Order has also been sent to MR for scanning.

## 2020-11-19 ENCOUNTER — Ambulatory Visit (HOSPITAL_COMMUNITY): Payer: Medicare Other | Admitting: Psychiatry

## 2020-11-20 ENCOUNTER — Encounter: Payer: Self-pay | Admitting: Physical Therapy

## 2020-11-20 ENCOUNTER — Other Ambulatory Visit: Payer: Self-pay

## 2020-11-20 ENCOUNTER — Ambulatory Visit: Payer: Medicare Other | Admitting: Physical Therapy

## 2020-11-20 DIAGNOSIS — R2689 Other abnormalities of gait and mobility: Secondary | ICD-10-CM

## 2020-11-20 NOTE — Therapy (Addendum)
[facility] [address] [place], [place], [address] Phone: [phone]   Fax:  [phone]  Physical Therapy Evaluation Only - Arrived No Charge  Patient Details  Name: Brandon [name] MRN: [id number] Date of Birth: [birth year] No data recorded  Encounter Date: 11/20/2020   PT End of Session - 11/20/20 1519    Visit Number 1    Number of Visits 1    PT Start Time 1445    PT Stop Time 1510    PT Time Calculation (min) 25 min           Past Medical History:  Diagnosis Date  . ADHD (attention deficit hyperactivity disorder)   . Bipolar 1 disorder (HCC)   . Chronic back pain   . Chronic constipation   . Chronic neck pain   . Hypertension   . Multiple sclerosis (HCC) 05/20/2013   left sided weakness, dysarthria  . Non-compliance   . Obesity   . Pulmonary embolism (HCC)   . Schizophrenia (HCC)   . Stroke [facility])    left sided deficits - pt's mother denies this    Past Surgical History:  Procedure Laterality Date  . NO PAST SURGERIES    . None    . TOOTH EXTRACTION N/A 06/24/2019   Procedure: DENTAL RESTORATION/EXTRACTION OF TEETH NUMBER ONE, SIXTEEN, SEVENTEEN, NINETEEN, THIRTY-TWO;  Surgeon: Ocie Doyne, DDS;  Location: [facility] OR;  Service: Oral Surgery;  Laterality: N/A;    There were no vitals filed for this visit.    Subjective Assessment - 11/20/20 1450    Subjective History given by pt's mother. Pt hospitalized 3/10 due to falls - ER felt that these falls were more related to poor control of his psychiatric conditions. Had his haloperidol increased in march. Pt's mother reports that he had a decline in his cognitive and physical capabilities within the last 3 months. Has a RW at home, he uses it, but he does not want to use it. Had a fall in the bathtub this morning, did not hurt himself, was able to get up on his own. Reports it is harder for him to get up from lower surfaces.    Patient is accompained  by: Family member   mom   Pertinent History MS, gait abnormality, ADHD, disorganized schizophrenia, intellectual disability, hypertension, prior stroke with left-sided weakness,  obesity, history of PE    Limitations Walking            Pt arrived to PT eval with his mother. Entire history provided by patient's mother. Pt with disorganized schizophrenia and pt talking to himself throughout due to auditory hallucinations (with slightly louder voices at times) and unable to attend to therapist to answer questions. Pt's mom reports that this has gotten worse in the past couple of months. Pt becoming slightly agitated when hearing the voice from the loudspeaker in clinic, needed to sit in room with door closed as to not hear outside voices. Pt's mother able to calm pt down. Pt then stating that he wants to go outside. Discussed with pt's mom, would be best for pt to be better managed from a psychiatric standpoint in order for pt to be able to participate in OPPT as it is not appropriate at this time. Provided name of pt's psychiatrist as well as phone number for office (as pt's mom was not aware) and discussed having pt get scheduled for an in person visit (as last appt was a video appt). Ambulated pt  outdoors with no AD and min guard with pt having an aggressive episode outside  by spitting onto the ground and banging fist into wall, pt then able to calm down. Provided wheelchair for pt to sit in to wait for bus to pick up pt and his mom.               Objective measurements completed on examination: See above findings.                             Patient will benefit from skilled therapeutic intervention in order to improve the following deficits and impairments:     Visit Diagnosis: Other abnormalities of gait and mobility     Problem List Patient Active Problem List   Diagnosis Date Noted  . Acute metabolic encephalopathy 09/14/2020  . Falls 09/13/2020   . COVID-19 virus infection 07/17/2020  . COVID-19 07/17/2020  . Unspecified intellectual disabilities 01/27/2020  . Migraine headache 04/18/2019  . Generalized weakness   . Pulmonary embolism (HCC) 08/12/2017  . Hyperglycemia 07/31/2017  . Multiple sclerosis exacerbation (HCC) 07/30/2017  . Acute encephalopathy 07/30/2017  . Schizoaffective disorder, bipolar type (HCC)   . Disorganized schizophrenia (HCC)   . Hallucinations 08/05/2014  . Auditory hallucination   . Left-sided weakness 01/18/2014  . Multiple sclerosis (HCC) 05/20/2013  . 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 Abnormality of gait 11/23/2012  . HTN (hypertension) 07/04/2011  . 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 Obesity, Class III, BMI 40-49.9 (morbid obesity) (HCC) 01/14/2010  . ADHD 01/14/2010    Drake Leach, PT, DPT  11/20/2020, 3:20 PM  [facility] [address] [place], [place], [address] Phone: [phone]   Fax:  [phone]  Name: Brandon [name] MRN: [id number] Date of Birth: 05-25-1991

## 2020-11-21 ENCOUNTER — Other Ambulatory Visit: Payer: Self-pay | Admitting: Optometry

## 2020-11-21 ENCOUNTER — Ambulatory Visit (INDEPENDENT_AMBULATORY_CARE_PROVIDER_SITE_OTHER): Payer: Medicare Other

## 2020-11-21 ENCOUNTER — Other Ambulatory Visit (HOSPITAL_COMMUNITY): Payer: Self-pay

## 2020-11-21 DIAGNOSIS — G35 Multiple sclerosis: Secondary | ICD-10-CM

## 2020-11-21 DIAGNOSIS — R4182 Altered mental status, unspecified: Secondary | ICD-10-CM | POA: Diagnosis not present

## 2020-11-21 NOTE — Procedures (Signed)
    History:  Brandon [name] is a 30 year old gentleman with a history of multiple sclerosis and schizophrenia.  He has frequent episodes of sudden alteration of awareness, goes into episodes of hallucinations with sudden return to normal consciousness.  The patient is being evaluated for the possibility of seizure events.  This is a routine EEG.  No skull defects are noted.  Medications include Norvasc, Eliquis, Cogentin, Haldol, metformin, Ocrevus, Topamax, and trazodone.  EEG classification: Normal awake  Description of the recording: The background rhythms of this recording consists of a fairly well modulated medium amplitude alpha rhythm of 9 Hz that is reactive to eye opening and closure. As the record progresses, the patient appears to remain in the waking state throughout the recording. Photic stimulation was performed, resulting in a bilateral and symmetric photic driving response. Hyperventilation was also performed, resulting in a minimal buildup of the background rhythm activities without significant slowing seen. At no time during the recording does there appear to be evidence of spike or spike wave discharges or evidence of focal slowing. EKG monitor shows no evidence of cardiac rhythm abnormalities with a heart rate of 66.  Impression: This is a normal EEG recording in the waking state. No evidence of ictal or interictal discharges are seen.

## 2020-11-22 ENCOUNTER — Telehealth: Payer: Self-pay | Admitting: Emergency Medicine

## 2020-11-22 ENCOUNTER — Other Ambulatory Visit (HOSPITAL_COMMUNITY): Payer: Self-pay

## 2020-11-22 NOTE — Telephone Encounter (Signed)
Called patient's mom's and went over Dr. Clarisa Kindred review and findings regarding EEG Study.  Patient denied further questions, verbalized understanding and expressed appreciation for the phone call.

## 2020-11-22 NOTE — Telephone Encounter (Signed)
-----   Message from York Spaniel, MD sent at 11/21/2020  5:07 PM EDT ----- Please call the patient's mother and let her know that the EEG study was unremarkable, no evidence of seizures.

## 2020-11-23 ENCOUNTER — Other Ambulatory Visit (HOSPITAL_COMMUNITY): Payer: Self-pay

## 2020-11-23 MED ORDER — LATANOPROST 0.005 % OP SOLN
OPHTHALMIC | 10 refills | Status: DC
  Filled 2020-11-23 – 2021-03-01 (×2): qty 7.5, 75d supply, fill #0

## 2020-12-04 ENCOUNTER — Other Ambulatory Visit (HOSPITAL_COMMUNITY): Payer: Self-pay

## 2020-12-06 ENCOUNTER — Other Ambulatory Visit (HOSPITAL_COMMUNITY): Payer: Self-pay

## 2020-12-06 MED ORDER — LATANOPROST 0.005 % OP SOLN
1.0000 [drp] | Freq: Every day | OPHTHALMIC | 3 refills | Status: DC
  Filled 2020-12-06: qty 7.5, 75d supply, fill #0

## 2020-12-12 ENCOUNTER — Other Ambulatory Visit (HOSPITAL_COMMUNITY): Payer: Self-pay

## 2020-12-13 ENCOUNTER — Other Ambulatory Visit (HOSPITAL_COMMUNITY): Payer: Self-pay | Admitting: Psychiatry

## 2020-12-13 ENCOUNTER — Telehealth (HOSPITAL_COMMUNITY): Payer: Self-pay | Admitting: *Deleted

## 2020-12-13 DIAGNOSIS — F201 Disorganized schizophrenia: Secondary | ICD-10-CM

## 2020-12-13 NOTE — Telephone Encounter (Signed)
Received a request from [facility] for patients meds. This patient is no longer receiving services from [place] but rather thru [employer] at [phone]. I called [facility] to provide them this information.

## 2020-12-14 ENCOUNTER — Other Ambulatory Visit (HOSPITAL_COMMUNITY): Payer: Self-pay

## 2021-01-10 ENCOUNTER — Telehealth: Payer: Self-pay | Admitting: Neurology

## 2021-01-10 NOTE — Telephone Encounter (Signed)
Pt's mother(on DPR) states pt is becoming weaker and would like to know if steroids could be called in for pt, please call.

## 2021-01-10 NOTE — Addendum Note (Signed)
Addended by: York Spaniel on: 01/10/2021 01:02 PM   Modules accepted: Orders

## 2021-01-10 NOTE — Telephone Encounter (Signed)
I called and talk with the mother.  Over the last week or 2, the patient has had troubles with increased falls.  She goes on to say that her psychiatrist has taken him off of the trazodone and started gabapentin recently, she is not sure of the dose exactly but she will call our office later.  The gabapentin may be the source of the gait instability.  The patient is to stop the gabapentin and see if the walking improves over the next week, if not, we may need to consider steroid treatment, blood work and urinalysis.

## 2021-01-13 ENCOUNTER — Ambulatory Visit (INDEPENDENT_AMBULATORY_CARE_PROVIDER_SITE_OTHER)
Admission: EM | Admit: 2021-01-13 | Discharge: 2021-01-14 | Disposition: A | Discharge status: Other Acute Inpatient Hospital | Payer: Medicare Other | Source: Home / Self Care

## 2021-01-13 ENCOUNTER — Other Ambulatory Visit: Payer: Self-pay

## 2021-01-13 DIAGNOSIS — G35 Multiple sclerosis: Secondary | ICD-10-CM | POA: Diagnosis not present

## 2021-01-13 DIAGNOSIS — F201 Disorganized schizophrenia: Secondary | ICD-10-CM

## 2021-01-13 DIAGNOSIS — R269 Unspecified abnormalities of gait and mobility: Secondary | ICD-10-CM | POA: Diagnosis not present

## 2021-01-13 MED ORDER — OLANZAPINE 5 MG PO TBDP: 5.0000 mg | ORAL_TABLET | Freq: Three times a day (TID) | ORAL | Status: DC | PRN

## 2021-01-13 MED ORDER — GABAPENTIN 400 MG PO CAPS: 400.0000 mg | ORAL_CAPSULE | Freq: Three times a day (TID) | ORAL | Status: DC

## 2021-01-13 MED ORDER — AMLODIPINE BESYLATE 5 MG PO TABS: 5.0000 mg | ORAL_TABLET | Freq: Every day | ORAL | Status: DC

## 2021-01-13 MED ORDER — ADULT MULTIVITAMIN W/MINERALS CH: 1.0000 | ORAL_TABLET | Freq: Every day | ORAL | Status: DC

## 2021-01-13 MED ORDER — CLONAZEPAM 0.5 MG PO TABS: 0.5000 mg | ORAL_TABLET | Freq: Every day | ORAL | Status: DC | PRN

## 2021-01-13 MED ORDER — METFORMIN HCL ER 500 MG PO TB24: 500.0000 mg | ORAL_TABLET | Freq: Every day | ORAL | Status: DC

## 2021-01-13 MED ORDER — ALUM & MAG HYDROXIDE-SIMETH 200-200-20 MG/5ML PO SUSP: 30.0000 mL | ORAL | Status: DC | PRN

## 2021-01-13 MED ORDER — APIXABAN 5 MG PO TABS: 5.0000 mg | ORAL_TABLET | Freq: Two times a day (BID) | ORAL | Status: DC

## 2021-01-13 MED ORDER — MAGNESIUM HYDROXIDE 400 MG/5ML PO SUSP: 30.0000 mL | Freq: Every day | ORAL | Status: DC | PRN

## 2021-01-13 MED ORDER — TRAZODONE HCL 100 MG PO TABS: 200.0000 mg | ORAL_TABLET | Freq: Every day | ORAL | Status: DC

## 2021-01-13 MED ORDER — DOCUSATE SODIUM 100 MG PO CAPS: 400.0000 mg | ORAL_CAPSULE | Freq: Every day | ORAL | Status: DC

## 2021-01-13 MED ORDER — HALOPERIDOL 5 MG PO TABS: 10.0000 mg | ORAL_TABLET | Freq: Two times a day (BID) | ORAL | Status: DC

## 2021-01-13 MED ORDER — ACETAMINOPHEN 325 MG PO TABS: 650.0000 mg | ORAL_TABLET | Freq: Four times a day (QID) | ORAL | Status: DC | PRN

## 2021-01-13 MED ORDER — LATANOPROST 0.005 % OP SOLN
1.0000 [drp] | Freq: Every day | OPHTHALMIC | Status: DC
  Filled 2021-01-13: qty 2.5

## 2021-01-13 MED ORDER — HYDROXYZINE HCL 25 MG PO TABS: 25.0000 mg | ORAL_TABLET | Freq: Three times a day (TID) | ORAL | Status: DC | PRN

## 2021-01-13 MED ORDER — TOPIRAMATE 25 MG PO TABS: 25.0000 mg | ORAL_TABLET | Freq: Two times a day (BID) | ORAL | Status: DC

## 2021-01-13 MED ORDER — AMANTADINE HCL 100 MG PO CAPS: 100.0000 mg | ORAL_CAPSULE | Freq: Two times a day (BID) | ORAL | Status: DC

## 2021-01-13 MED ORDER — LORAZEPAM 1 MG PO TABS: 1.0000 mg | ORAL_TABLET | ORAL | Status: DC | PRN

## 2021-01-13 MED ORDER — BENZTROPINE MESYLATE 1 MG PO TABS: 1.0000 mg | ORAL_TABLET | Freq: Two times a day (BID) | ORAL | Status: DC
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
## 2021-01-13 NOTE — BH Assessment (Signed)
Attempted to call legal guardian/mother per Epic [facility] @ [phone]) to get additional information as to why pt brought to [facility]. No answer. Left a HIPAA compliant message for a callback.   Teagon Kron T. Jimmye Norman, MS, [facility], [facility] Triage Specialist [facility]

## 2021-01-13 NOTE — Progress Notes (Addendum)
   01/13/21 2046  BHUC Triage Screening (Walk-ins at [facility] only)  How Did You Hear About [place]? Family/Friend  What Is the Reason for Your Visit/Call Today? Pt's reason for coming to the [facility] is not clear. Per Registration staff, his family dropped him off and said they were going to the magistrate's office to IVC him. Reportedly, they said, "he has some things going on in his head and he needs some help." Call placed to his mother, listed as legal guardian in EPIC [facility] [phone]) but there was no answer. Left a HIPAA complaint message for a callback as soon as possible. Hx of schizophrenia and MS. Per notes, recent medication changes via phone call by Dr. Anne Hahn (01/10/21).  How Long Has This Been Causing You Problems? > than 6 months  Have You Recently Had Any Thoughts About Hurting Yourself? No (denies)  Are You Planning to Commit Suicide/Harm Yourself At This time? No  Have you Recently Had Thoughts About Hurting Someone [name]? No (denies)  Are You Planning To Harm Someone At This Time? No  Are you currently experiencing any auditory, visual or other hallucinations? No (Pt denies AVH but appeared to be responding to internal stimuli during the triage assessment.)  Have You Used Any Alcohol or Drugs in the Past 24 Hours?  (UTA)  Do you have any current medical co-morbidities that require immediate attention? Yes  Please describe current medical co-morbidities that require immediate attention: Pt has a medical dx of MS and was medically hospitalized in March, 2022.  Clinician description of patient physical appearance/behavior: Pt was lying down on a sofa in the assessment room, appeared sleepy/groggy, talked in a garbled slow manner and would stop at times mid-sentence. Pt appeared to be looking off into space toward the ceiling for no apparent reason at times. Pt appeared to be listening at times as if hearing voices speaking to him.  What Do You Feel Would Help You the Most Today?   (When asked pt stated he did not need help today.)  If access to [facility] was not available, would you have sought care in the Emergency Department?  (UTA)  Determination of Need Urgent (48 hours)  Options For Referral [facility]gent [facility]  Clinician asked pt twice to sign the Waiver of Medical Screening Exam but he did not respond. Will mark as a refusal to sign.   Brandon [name] T. Jimmye Norman, MS, [facility], [facility] Triage Specialist [facility]

## 2021-01-14 ENCOUNTER — Other Ambulatory Visit (HOSPITAL_COMMUNITY): Payer: Self-pay | Admitting: Psychiatry

## 2021-01-14 ENCOUNTER — Inpatient Hospital Stay (HOSPITAL_COMMUNITY)
Admission: EM | Admit: 2021-01-14 | Discharge: 2021-01-17 | DRG: 059 | Disposition: A | Discharge status: Home / Self Care | Payer: Medicare Other | Attending: Internal Medicine | Admitting: Internal Medicine

## 2021-01-14 ENCOUNTER — Emergency Department (HOSPITAL_COMMUNITY): Payer: Medicare Other

## 2021-01-14 ENCOUNTER — Other Ambulatory Visit: Payer: Self-pay

## 2021-01-14 DIAGNOSIS — F909 Attention-deficit hyperactivity disorder, unspecified type: Secondary | ICD-10-CM | POA: Diagnosis present

## 2021-01-14 DIAGNOSIS — Z79899 Other long term (current) drug therapy: Secondary | ICD-10-CM

## 2021-01-14 DIAGNOSIS — G35 Multiple sclerosis: Secondary | ICD-10-CM | POA: Diagnosis not present

## 2021-01-14 DIAGNOSIS — R2681 Unsteadiness on feet: Secondary | ICD-10-CM | POA: Diagnosis not present

## 2021-01-14 DIAGNOSIS — I10 Essential (primary) hypertension: Secondary | ICD-10-CM | POA: Diagnosis present

## 2021-01-14 DIAGNOSIS — Z86718 Personal history of other venous thrombosis and embolism: Secondary | ICD-10-CM

## 2021-01-14 DIAGNOSIS — F209 Schizophrenia, unspecified: Secondary | ICD-10-CM

## 2021-01-14 DIAGNOSIS — R7303 Prediabetes: Secondary | ICD-10-CM | POA: Diagnosis present

## 2021-01-14 DIAGNOSIS — Z7984 Long term (current) use of oral hypoglycemic drugs: Secondary | ICD-10-CM

## 2021-01-14 DIAGNOSIS — F201 Disorganized schizophrenia: Secondary | ICD-10-CM | POA: Diagnosis not present

## 2021-01-14 DIAGNOSIS — Z818 Family history of other mental and behavioral disorders: Secondary | ICD-10-CM

## 2021-01-14 DIAGNOSIS — I2699 Other pulmonary embolism without acute cor pulmonale: Secondary | ICD-10-CM | POA: Diagnosis present

## 2021-01-14 DIAGNOSIS — G4700 Insomnia, unspecified: Secondary | ICD-10-CM | POA: Diagnosis present

## 2021-01-14 DIAGNOSIS — Z20822 Contact with and (suspected) exposure to covid-19: Secondary | ICD-10-CM | POA: Diagnosis present

## 2021-01-14 DIAGNOSIS — R269 Unspecified abnormalities of gait and mobility: Secondary | ICD-10-CM

## 2021-01-14 DIAGNOSIS — G319 Degenerative disease of nervous system, unspecified: Secondary | ICD-10-CM | POA: Diagnosis present

## 2021-01-14 DIAGNOSIS — Z7901 Long term (current) use of anticoagulants: Secondary | ICD-10-CM

## 2021-01-14 DIAGNOSIS — F79 Unspecified intellectual disabilities: Secondary | ICD-10-CM | POA: Diagnosis present

## 2021-01-14 DIAGNOSIS — Z8673 Personal history of transient ischemic attack (TIA), and cerebral infarction without residual deficits: Secondary | ICD-10-CM

## 2021-01-14 DIAGNOSIS — Z6841 Body Mass Index (BMI) 40.0 and over, adult: Secondary | ICD-10-CM

## 2021-01-14 DIAGNOSIS — Z833 Family history of diabetes mellitus: Secondary | ICD-10-CM

## 2021-01-14 DIAGNOSIS — F319 Bipolar disorder, unspecified: Secondary | ICD-10-CM | POA: Diagnosis present

## 2021-01-14 DIAGNOSIS — Z86711 Personal history of pulmonary embolism: Secondary | ICD-10-CM

## 2021-01-14 DIAGNOSIS — F17210 Nicotine dependence, cigarettes, uncomplicated: Secondary | ICD-10-CM | POA: Diagnosis present

## 2021-01-14 LAB — COMPREHENSIVE METABOLIC PANEL
ALT: 36 U/L (ref 0–44)
AST: 17 U/L (ref 15–41)
Albumin: 4 g/dL (ref 3.5–5.0)
Alkaline Phosphatase: 61 U/L (ref 38–126)
Anion gap: 8 (ref 5–15)
BUN: 14 mg/dL (ref 6–20)
CO2: 20 mmol/L — ABNORMAL LOW (ref 22–32)
Calcium: 9.2 mg/dL (ref 8.9–10.3)
Chloride: 110 mmol/L (ref 98–111)
Creatinine, Ser: 1.07 mg/dL (ref 0.61–1.24)
GFR, Estimated: >60 mL/min (ref >60)
Glucose, Bld: 103 mg/dL — ABNORMAL HIGH (ref 70–99)
Potassium: 4 mmol/L (ref 3.5–5.1)
Sodium: 138 mmol/L (ref 135–145)
Total Bilirubin: 0.6 mg/dL (ref 0.3–1.2)
Total Protein: 6 g/dL — ABNORMAL LOW (ref 6.5–8.1)

## 2021-01-14 LAB — RESP PANEL BY RT-PCR (FLU A&B, COVID) ARPGX2
Influenza A by PCR: NEGATIVE
Influenza B by PCR: NEGATIVE
SARS Coronavirus 2 by RT PCR: NEGATIVE

## 2021-01-14 LAB — CBC WITH DIFFERENTIAL/PLATELET
Abs Immature Granulocytes: 0.04 10*3/uL (ref 0.00–0.07)
Basophils Absolute: 0 10*3/uL (ref 0.0–0.1)
Basophils Relative: 1 %
Eosinophils Absolute: 0.3 10*3/uL (ref 0.0–0.5)
Eosinophils Relative: 4 %
HCT: 42.9 % (ref 39.0–52.0)
Hemoglobin: 13.6 g/dL (ref 13.0–17.0)
Immature Granulocytes: 1 %
Lymphocytes Relative: 28 %
Lymphs Abs: 2 10*3/uL (ref 0.7–4.0)
MCH: 26.2 pg (ref 26.0–34.0)
MCHC: 31.7 g/dL (ref 30.0–36.0)
MCV: 82.7 fL (ref 80.0–100.0)
Monocytes Absolute: 0.7 10*3/uL (ref 0.1–1.0)
Monocytes Relative: 10 %
Neutro Abs: 4.1 10*3/uL (ref 1.7–7.7)
Neutrophils Relative %: 56 %
Platelets: 197 10*3/uL (ref 150–400)
RBC: 5.19 MIL/uL (ref 4.22–5.81)
RDW: 15.2 % (ref 11.5–15.5)
WBC: 7.2 10*3/uL (ref 4.0–10.5)
nRBC: 0 % (ref 0.0–0.2)

## 2021-01-14 MED ORDER — APIXABAN 5 MG PO TABS
5.0000 mg | ORAL_TABLET | Freq: Two times a day (BID) | ORAL | Status: DC
  Administered 2021-01-14 – 2021-01-17 (×6): 5 mg via ORAL
  Filled 2021-01-14 (×6): qty 1

## 2021-01-14 MED ORDER — ZIPRASIDONE MESYLATE 20 MG IM SOLR
10.0000 mg | Freq: Once | INTRAMUSCULAR | Status: AC | PRN
  Administered 2021-01-14: 10 mg via INTRAMUSCULAR
  Filled 2021-01-14: qty 20

## 2021-01-14 MED ORDER — LORAZEPAM 2 MG/ML IJ SOLN
2.0000 mg | Freq: Once | INTRAMUSCULAR | Status: AC | PRN
  Administered 2021-01-14: 2 mg via INTRAMUSCULAR

## 2021-01-14 MED ORDER — AMLODIPINE BESYLATE 5 MG PO TABS
5.0000 mg | ORAL_TABLET | Freq: Every day | ORAL | Status: DC
  Administered 2021-01-14 – 2021-01-17 (×4): 5 mg via ORAL
  Filled 2021-01-14 (×4): qty 1

## 2021-01-14 MED ORDER — LORAZEPAM 2 MG/ML IJ SOLN: 1.0000 mg | Freq: Once | INTRAMUSCULAR | Status: DC | PRN

## 2021-01-14 MED ORDER — STERILE WATER FOR INJECTION IJ SOLN
INTRAMUSCULAR | Status: AC
  Filled 2021-01-14: qty 10

## 2021-01-14 MED ORDER — SODIUM CHLORIDE 0.9% FLUSH: 3.0000 mL | INTRAVENOUS | Status: DC | PRN

## 2021-01-14 MED ORDER — SODIUM CHLORIDE 0.9% FLUSH
3.0000 mL | Freq: Two times a day (BID) | INTRAVENOUS | Status: DC
  Administered 2021-01-15 – 2021-01-17 (×3): 3 mL via INTRAVENOUS

## 2021-01-14 MED ORDER — SODIUM CHLORIDE 0.9 % IV SOLN: 250.0000 mL | INTRAVENOUS | Status: DC | PRN

## 2021-01-14 MED ORDER — ZIPRASIDONE MESYLATE 20 MG IM SOLR
20.0000 mg | Freq: Once | INTRAMUSCULAR | Status: AC
  Administered 2021-01-14: 20 mg via INTRAMUSCULAR
  Filled 2021-01-14: qty 20

## 2021-01-14 MED ORDER — LATANOPROST 0.005 % OP SOLN
1.0000 [drp] | Freq: Every day | OPHTHALMIC | Status: DC
  Administered 2021-01-15 – 2021-01-16 (×2): 1 [drp] via OPHTHALMIC
  Filled 2021-01-14: qty 2.5

## 2021-01-14 MED ORDER — DOCUSATE SODIUM 100 MG PO CAPS
400.0000 mg | ORAL_CAPSULE | Freq: Every day | ORAL | Status: DC
  Administered 2021-01-14 – 2021-01-16 (×3): 400 mg via ORAL
  Filled 2021-01-14 (×3): qty 4

## 2021-01-14 MED ORDER — LORAZEPAM 2 MG/ML IJ SOLN
2.0000 mg | Freq: Once | INTRAMUSCULAR | Status: DC | PRN
  Filled 2021-01-14: qty 1

## 2021-01-14 NOTE — ED Notes (Signed)
Will update vitals once pt return from MRI

## 2021-01-14 NOTE — Discharge Instructions (Addendum)
Transfer to [facility]  

## 2021-01-14 NOTE — ED Notes (Addendum)
While sitting in recliner pt started laughing and saying "lets do it" after this pt ran towards back door in triage trying to leave. Pt was redirectable

## 2021-01-14 NOTE — ED Notes (Signed)
Family updated as to patient's status.

## 2021-01-14 NOTE — H&P (Signed)
History and Physical    Brandon [name] WUJ:[id number] DOB: 02-20-1991 DOA: 01/14/2021  PCP: Maudie FlakesAnderson, Shane D, FNP Consultants:  neurology: Dr. Anne HahnWillis  and psychiatry:  [facility]. lives with mother   Chief Complaint: unsteady gait.   HPI: Brandon LawrenceRaymond S Selsor is a 30 y.o. male with medical history significant of MS followed by neurology on Ocrevus, HTN, PE and schizophrenia who [facility] for medical [facility] due to unsteady gait and weakness in bilateral lower extremities. He was having increasing difficulty ambulating.  He was at behavorial health as he was becoming more aggressive toward his mother and the voices were telling him to beat her and bloody her in the face yesterday. [facility].   Was seen by his neurologist in April with complaints of progressive weakness and worsening mobility. More complaints of the axial muscles.  Was advised on 01/10/21 to stop the gabapentin as may have been source of gait instability.   ED Course: vitals on arrival: bp: 143/94, HR: 84, afebrile, oxygen 98% on room air. Lab work unremarkable. Was aggressive and agitated. Geodon x total 30mg  given as well as 2mg  of ativan. MRI done, but could not rule out ms flair due to him not tolerating. Neurology consulted and recommended we repeat MRI to rule out flair. Asked to admit.   Review of Systems: As per HPI; otherwise review of systems reviewed and negative.   Ambulatory Status:  Ambulates without assistance    Past Medical History:  Diagnosis Date   ADHD (attention deficit hyperactivity disorder)    Bipolar 1 disorder (HCC)    Chronic back pain    Chronic constipation    Chronic neck pain    Hypertension    Multiple sclerosis (HCC) 05/20/2013   left sided weakness, dysarthria   Non-compliance    Obesity    Pulmonary embolism (HCC)    Schizophrenia (HCC)    Stroke (HCC)    left sided deficits - pt's mother denies this     Past Surgical History:  Procedure Laterality Date   NO PAST SURGERIES     None     TOOTH EXTRACTION N/A 06/24/2019   Procedure: DENTAL RESTORATION/EXTRACTION OF TEETH NUMBER ONE, SIXTEEN, SEVENTEEN, NINETEEN, THIRTY-TWO;  Surgeon: Ocie DoyneJensen, Scott, DDS;  Location: [facility] OR;  Service: Oral Surgery;  Laterality: N/A;    Social History   Socioeconomic History   Marital status: Single    Spouse name: Not on file   Number of children: 0   Years of education: 11th   Highest education level: Not on file  Occupational History   Occupation: unemployed    Employer: [employer]    Comment: Disbaled  Tobacco Use   Smoking status: Every Day    Packs/day: 0.25    Pack years: 0.00    Types: Cigarettes   Smokeless tobacco: Never   Tobacco comments:    2 cigarettes a day  Vaping Use   Vaping Use: Never used  Substance and Sexual Activity   Alcohol use: Not Currently    Alcohol/week: 0.0 standard drinks    Comment: "A little bit"    Drug use: Not Currently    Types: Marijuana    Comment: Last used: unknown    Sexual activity: Not on file  Other Topics Concern   Not on file  Social History Narrative   Patient lives at home with his mother.  Disabled.   Education 11 th grade .   Right handed.   Drinks caffeine occassionally   Social Determinants of Corporate investment banker Strain: Not on file  Food Insecurity: Not on file  Transportation Needs: Not on file  Physical Activity: Not on file  Stress: Not on file  Social Connections: Not on file  Intimate Partner Violence: Not on file    No Known Allergies  Family History  Problem Relation Age of Onset   Diabetes Mother    ADD / ADHD Brother     Prior to Admission medications   Medication Sig Start Date End Date Taking? Authorizing Provider  acetaminophen (TYLENOL) 500 MG tablet Take 1 tablet (500 mg total) by mouth every 6 (six) hours as needed. Patient taking differently: Take 500 mg by mouth every 6 (six) hours  as needed for moderate pain. 06/21/20   Fayrene Helper, PA-C  amantadine (SYMMETREL) 100 MG capsule Take 100 mg by mouth 2 (two) times daily. 12/26/20   [provider]  amLODipine (NORVASC) 5 MG tablet Take 5 mg by mouth daily. 09/06/20   [provider]  apixaban (ELIQUIS) 5 MG TABS tablet Take 5 mg by mouth 2 (two) times daily.     [provider]  Asenapine Maleate 10 MG SUBL Place 1 tablet (10 mg total) under the tongue in the morning, at noon, and at bedtime. 09/24/20   Zena Amos, MD  benztropine (COGENTIN) 1 MG tablet Take 1 tablet (1 mg total) by mouth 2 (two) times daily. 09/24/20   Zena Amos, MD  clonazePAM (KLONOPIN) 0.5 MG tablet Take 0.5 mg by mouth daily as needed. 12/26/20   [provider]  docusate sodium (COLACE) 100 MG capsule Take 400 mg by mouth at bedtime.     [provider]  gabapentin (NEURONTIN) 400 MG capsule Take 400 mg by mouth 3 (three) times daily. 12/26/20   [provider]  haloperidol (HALDOL) 10 MG tablet Take 1 tablet (10 mg total) by mouth 2 (two) times daily. 09/24/20   Zena Amos, MD  haloperidol decanoate (HALDOL DECANOATE) 100 MG/ML injection Inject 3 mLs (300 mg total) into the muscle every 28 (twenty-eight) days. 09/24/20   Zena Amos, MD  latanoprost (XALATAN) 0.005 % ophthalmic solution INSTILL 1 DROP INTO BOTH EYES EVERY NIGHT Patient taking differently: Place 1 drop into both eyes at bedtime. 11/23/20 11/23/21    latanoprost (XALATAN) 0.005 % ophthalmic solution Instill 1 drop into both eyes at bedtime 12/06/20     Melatonin 10 MG CAPS TAKE ONE TABLET BY MOUTH AT BEDTIME Patient taking differently: Take 10 mg by mouth at bedtime. 10/17/20   Shanna Cisco, NP  metFORMIN (GLUCOPHAGE-XR) 500 MG 24 hr tablet Take 500 mg by mouth daily with breakfast.    [provider]  Multiple Vitamin (MULTIVITAMIN WITH MINERALS) TABS tablet Take 1 tablet by mouth daily.    [provider]   ocrelizumab 300 mg in sodium chloride 0.9 % 500 mL Inject 300 mg into the vein every 6 (six) months. CHANGE ::  Ocrevus 600mg  IV EVERY 8 (EIGHT) months. 11/06/20   01/06/21, MD  topiramate (TOPAMAX) 25 MG tablet TAKE 1 TABLET (25 MG TOTAL) BY MOUTH TWO (TWO) TIMES DAILY. Patient taking differently: Take 25 mg by mouth 2 (two) times daily. 10/17/20   10/19/20, NP  propranolol (INDERAL) 10 MG tablet Take 1 tablet (10 mg total) by mouth 2 (two) times daily. 05/03/15 01/03/20  Charm Rings, NP    Physical Exam: Vitals:   01/14/21 0708 01/14/21 1015 01/14/21 1416 01/14/21 1529  BP: (!) 150/102 (!) 136/103 (!) 89/60 (!) 131/103  Pulse: 69 84 78   Resp: 16 16 16    Temp:  97.9 F (36.6 C) 98 F (36.7 C)   TempSrc:  Oral Oral   SpO2: 98% 98% 100%   Weight:      Height:         General:  Appears calm and comfortable and is in NAD Eyes:  PERRL, EOMI, normal lids, iris ENT:  grossly normal hearing, lips & tongue, mmm; appropriate dentition Neck:  no LAD, masses or thyromegaly; no carotid bruits Cardiovascular:  RRR, no m/r/g. No LE edema.  Respiratory:   CTA bilaterally with no wheezes/rales/rhonchi.  Normal respiratory effort. Abdomen:  soft, NT, ND, NABS Back:   normal alignment, no CVAT Skin:  no rash or induration seen on limited exam Musculoskeletal:  grossly normal tone BUE/BLE, good ROM, no bony abnormality Lower extremity:  No LE edema.  Limited foot exam with no ulcerations.  2+ distal pulses. Psychiatric:  flat mood and affect. speech slow and appropriate, Aox3. Denies any si/hi/ah/vh  Neurologic:  CN 2-12 grossly intact, moves all extremities in coordinated fashion, sensation intact. Strength 5/5 in bilateral upper and lower extremities. Gait: did not test. DTR: 2+. Sensation intact. Drift in right UE.     Radiological Exams on Admission: Independently reviewed - see discussion in A/P where applicable  MR BRAIN WO CONTRAST  Result Date: 01/14/2021 CLINICAL  DATA:  Multiple sclerosis, new event. EXAM: MRI HEAD WITHOUT CONTRAST TECHNIQUE: Multiplanar, multiecho pulse sequences of the brain and surrounding structures were obtained without intravenous contrast. COMPARISON:  Same day cervical spine MRI 01/14/2021. Prior brain MRI examinations 01/14/2021 and earlier. FINDINGS: Brain: The examination is intermittently motion degraded, limiting evaluation. Most notably, there is moderate/severe motion degradation of the axial T2 sequence, severe motion degradation of the axial SWI sequence, severe motion degradation of the axial T1 weighted sequence and severe motion degradation of the coronal T2 sequence. Age advanced cerebral and cerebellar atrophy. Again demonstrated is advanced multifocal T2/FLAIR hyperintense signal abnormality within the subcortical/juxtacortical and deep periventricular white matter, as well as corpus callosum. No definitively new lesion is identified as compared to the prior brain MRI of 09/13/2020. No definite posterior fossa lesions are identified. There is no acute infarct. Within described limitations, no intracranial mass, chronic intracranial blood products or extra-axial fluid collection is identified. No midline shift. Vascular: Expected proximal arterial flow voids. Skull and upper cervical spine: No focal marrow lesion. Sinuses/Orbits: Visualized orbits show no acute finding. Small right maxillary sinus mucous retention cyst at the imaged levels. IMPRESSION: Motion degraded examination, as described and limiting evaluation. Redemonstrated severe supratentorial white matter disease, not appreciably changed from the brain MRI of 09/13/2020. Findings are compatible with the provided history of multiple sclerosis. Age-advanced generalized cerebral and cerebellar atrophy. Electronically Signed   By: 11/13/2020 DO   On: 01/14/2021 15:52   MR BRAIN WO CONTRAST  Result Date: 01/14/2021 CLINICAL DATA:  Decreased ambulation, history of multiple  sclerosis EXAM: MRI HEAD WITHOUT CONTRAST TECHNIQUE: Multiplanar, multiecho pulse sequences of the brain and surrounding structures were obtained without intravenous contrast. COMPARISON:  09/13/2020 FINDINGS: Significant motion artifact is present. Brain: Patchy and confluent T2 hyperintensity in the supratentorial white matter is nonspecific but probably reflects reported multiple sclerosis. Burden is grossly unchanged. There is ex vacuo dilatation with prominence  of ventricles and sulci greater than expected for age. There is unchanged thinning of the corpus callosum. There is no acute infarction or intracranial hemorrhage. There is no intracranial mass, mass effect, or edema. There is no hydrocephalus or extra-axial fluid collection. Vascular: Major vessel flow voids at the skull base are preserved. Skull and upper cervical spine: Normal marrow signal is preserved. Sinuses/Orbits: Paranasal sinuses are aerated. Orbits are unremarkable. Other: Sella is unremarkable.  Mastoid air cells are clear. IMPRESSION: Significantly motion degraded. Similar burden of chronic demyelination with suboptimal evaluation for new lesions. Electronically Signed   By: Guadlupe Spanish M.D.   On: 01/14/2021 10:12   MR CERVICAL SPINE WO CONTRAST  Result Date: 01/14/2021 CLINICAL DATA:  Decreased ambulation. EXAM: MRI CERVICAL SPINE WITHOUT CONTRAST TECHNIQUE: Multiplanar, multisequence MR imaging of the cervical spine was performed. No intravenous contrast was administered. COMPARISON:  09/13/2020 FINDINGS: Alignment: Generalized straightening of the cervical spine Vertebrae: No fracture, evidence of discitis, or bone lesion. Cord: T2 hyperintensity is seen at the level of the C4 and C5 cord on STIR imaging. The extent is similar to a January 2022 comparison. No superimposed swelling is seen. Posterior Fossa, vertebral arteries, paraspinal tissues: Posterior fossa is reported separately. Disc levels: Negative for degenerative  changes. IMPRESSION: 1. Severe motion degradation, axial imaging is nondiagnostic. 2. Chronic demyelination at C4 and C5. Electronically Signed   By: Marnee Spring M.D.   On: 01/14/2021 10:40      Labs on Admission: I have personally reviewed the available labs and imaging studies at the time of the admission.  Pertinent labs:  Cbc/cmp wnl.     Assessment/Plan Principal Problem:   Unsteady gait -possible concern for MS flair.  -per neurology need repeat MRI, they are following, follow with recommendation -will check UA, UDS, B12, thyroid   Active Problems:   HTN (hypertension) -continued home norvasc     Multiple sclerosis (HCC) -followed by outpatient neurology on Ocrelizumab -r/o flair, f/u with neurology recommendations    Pulmonary embolism (HCC) -continue eliquis     Disorganized schizophrenia (HCC)  -received 30mg  of geodon in ER. Combative, aggressive, hallucinating, but was very calm when I saw him. Denied any vh/ah/si/hi at this time.  -med rec not reviewed, but will continue home medication once med rec done. -talked to mother and neurology did d/c gabapentin as it may have made him more unsteady so I took this out of his med list.  -psychiatry consult -1-1 sitter   Body mass index is 43.13 kg/m.   Level of care:  DVT prophylaxis:  eliquis Code Status:  Full - confirmed with family Family Communication: Called his mother, Jermell Greenwell [phone] Disposition Plan:  The patient is from: behavioral health   Anticipated d/c is to: behavior health for continued psychiatric care Anticipated d/c date will depend on work up for possible MS flair, but possibly as early as tomorrow if MRI does not show MS flair.    Consults called: neurology  Admission status:  observation   Orland Mustard MD [facility]   How to contact the [facility] Attending or Consulting provider 7A - 7P or covering provider during after hours 7P -7A, for this patient?  Check the care  team in [facility] and look for a) attending/consulting [facility] provider listed and b) the [facility] team listed Log into [email or web address] and use [place]'s universal password to access. If you do not have the password, please contact the hospital operator. Locate the [facility] provider you are looking for  under [facility] and page to a number that you can be directly reached. If you still have difficulty reaching the provider, please page the [facility] (Director on Call) for the Hospitalists listed on amion for assistance.   01/14/2021, 5:32 PM
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
## 2021-01-14 NOTE — ED Notes (Signed)
Report called to Jessica, [facility] charge RN 

## 2021-01-14 NOTE — Progress Notes (Signed)
Pt sent back to dept w/ more meds. A second attempt was made execute the exams that were ordered but pt's condition remained the same as before. RN was called about pt's condition and we were told pt could not receive any more meds and to send him back to the ED.Will complete the second brain exam as it was done and a few sequences were slightly better.

## 2021-01-14 NOTE — ED Provider Notes (Signed)
[facility] EMERGENCY DEPARTMENT Provider Note   CSN: [id number] Arrival date & time: 01/14/21  0058     History Chief Complaint  Patient presents with   Medical Clearance    Brandon [name] is a 30 y.o. male.  Brandon [name] is a 30 y.o. male with a history of MS, and schizophrenia, who presents to the emergency department from behavioral health urgent care for medical clearance.  Patient was brought to [facility] yesterday by [employer] and his mother after he became agitated and violent threatening his mother.  They were planning to keep patient at [facility] for further evaluation and treatment but noted that patient was having increasing difficulty ambulating and seem to be weaker and unsteady in bilateral lower extremities.  He was sent to the ED for further evaluation and medical clearance.  While waiting to be evaluated patient became agitated in triage and was given 20 mg of IM Geodon.  On my evaluation patient is laying quietly, but will not answer many questions or provide much additional history.  When asked about any pain he shakes his head.  Additional history obtained from chart which shows that patient is followed by Dr. Anne Hahn with neurology for his MS and is on Ocrevus.  Was seen in April and seem to be having progressive weakness and worsening in mobility, so unclear how acute this is although they report patient was able to ambulate normally upon arrival to [facility].  Level 5 caveat: Psychiatric disorder.  The history is provided by the patient and medical records.      Past Medical History:  Diagnosis Date   ADHD (attention deficit hyperactivity disorder)    Bipolar 1 disorder (HCC)    Chronic back pain    Chronic constipation    Chronic neck pain    Hypertension    Multiple sclerosis (HCC) 05/20/2013   left sided weakness, dysarthria   Non-compliance    Obesity    Pulmonary embolism (HCC)    Schizophrenia (HCC)    Stroke (HCC)    left sided deficits - pt's  mother denies this    Patient Active Problem List   Diagnosis Date Noted   Acute metabolic encephalopathy 09/14/2020   Falls 09/13/2020   COVID-19 virus infection 07/17/2020   COVID-19 07/17/2020   Unspecified intellectual disabilities 01/27/2020   Migraine headache 04/18/2019   Generalized weakness    Pulmonary embolism (HCC) 08/12/2017   Hyperglycemia 07/31/2017   Multiple sclerosis exacerbation (HCC) 07/30/2017   Acute encephalopathy 07/30/2017   Schizoaffective disorder, bipolar type (HCC)    Disorganized schizophrenia (HCC)    Hallucinations 08/05/2014   Auditory hallucination    Left-sided weakness 01/18/2014   Multiple sclerosis (HCC) 05/20/2013   White matter abnormality on MRI of brain 11/23/2012   Abnormality of gait 11/23/2012   HTN (hypertension) 07/04/2011   Ataxia 07/02/2011   Obesity, Class III, BMI 40-49.9 (morbid obesity) (HCC) 01/14/2010   ADHD 01/14/2010    Past Surgical History:  Procedure Laterality Date   NO PAST SURGERIES     None     TOOTH EXTRACTION N/A 06/24/2019   Procedure: DENTAL RESTORATION/EXTRACTION OF TEETH NUMBER ONE, SIXTEEN, SEVENTEEN, NINETEEN, THIRTY-TWO;  Surgeon: Ocie Doyne, DDS;  Location: [facility] OR;  Service: Oral Surgery;  Laterality: N/A;       Family History  Problem Relation Age of Onset   Diabetes Mother    ADD / ADHD Brother     Social History   Tobacco Use  Smoking status: Every Day    Packs/day: 0.25    Pack years: 0.00    Types: Cigarettes   Smokeless tobacco: Never   Tobacco comments:    2 cigarettes a day  Vaping Use   Vaping Use: Never used  Substance Use Topics   Alcohol use: Not Currently    Alcohol/week: 0.0 standard drinks    Comment: "A little bit"    Drug use: Not Currently    Types: Marijuana    Comment: Last used: unknown     Home Medications Prior to Admission medications   Medication Sig Start Date End Date Taking? Authorizing Provider  acetaminophen (TYLENOL) 500 MG tablet Take 1  tablet (500 mg total) by mouth every 6 (six) hours as needed. Patient taking differently: Take 500 mg by mouth every 6 (six) hours as needed for moderate pain. 06/21/20   Fayrene Helper, PA-C  amantadine (SYMMETREL) 100 MG capsule Take 100 mg by mouth 2 (two) times daily. 12/26/20   [provider]  amLODipine (NORVASC) 5 MG tablet Take 5 mg by mouth daily. 09/06/20   [provider]  apixaban (ELIQUIS) 5 MG TABS tablet Take 5 mg by mouth 2 (two) times daily.     [provider]  Asenapine Maleate 10 MG SUBL Place 1 tablet (10 mg total) under the tongue in the morning, at noon, and at bedtime. 09/24/20   Zena Amos, MD  benztropine (COGENTIN) 1 MG tablet Take 1 tablet (1 mg total) by mouth 2 (two) times daily. 09/24/20   Zena Amos, MD  clonazePAM (KLONOPIN) 0.5 MG tablet Take 0.5 mg by mouth daily as needed. 12/26/20   [provider]  docusate sodium (COLACE) 100 MG capsule Take 400 mg by mouth at bedtime.     [provider]  gabapentin (NEURONTIN) 400 MG capsule Take 400 mg by mouth 3 (three) times daily. 12/26/20   [provider]  haloperidol (HALDOL) 10 MG tablet Take 1 tablet (10 mg total) by mouth 2 (two) times daily. 09/24/20   Zena Amos, MD  haloperidol decanoate (HALDOL DECANOATE) 100 MG/ML injection Inject 3 mLs (300 mg total) into the muscle every 28 (twenty-eight) days. 09/24/20   Zena Amos, MD  latanoprost (XALATAN) 0.005 % ophthalmic solution INSTILL 1 DROP INTO BOTH EYES EVERY NIGHT Patient taking differently: Place 1 drop into both eyes at bedtime. 11/23/20 11/23/21    latanoprost (XALATAN) 0.005 % ophthalmic solution Instill 1 drop into both eyes at bedtime 12/06/20     Melatonin 10 MG CAPS TAKE ONE TABLET BY MOUTH AT BEDTIME Patient taking differently: Take 10 mg by mouth at bedtime. 10/17/20   Shanna Cisco, NP  metFORMIN (GLUCOPHAGE-XR) 500 MG 24 hr tablet Take 500 mg by mouth daily with breakfast.    [provider]  Multiple Vitamin (MULTIVITAMIN WITH MINERALS) TABS tablet Take 1 tablet by mouth daily.    [provider]  ocrelizumab 300 mg in sodium chloride 0.9 % 500 mL Inject 300 mg into the vein every 6 (six) months. CHANGE ::  Ocrevus 600mg  IV EVERY 8 (EIGHT) months. 11/06/20   01/06/21, MD  topiramate (TOPAMAX) 25 MG tablet TAKE 1 TABLET (25 MG TOTAL) BY MOUTH TWO (TWO) TIMES DAILY. Patient taking differently: Take 25 mg by mouth 2 (two) times daily. 10/17/20   10/19/20, NP  propranolol (INDERAL) 10 MG tablet Take 1 tablet (10 mg total) by mouth 2 (two) times daily. 05/03/15 01/03/20  01/05/20  Y, NP    Allergies    Patient has no known allergies.  Review of Systems   Review of Systems  Unable to perform ROS: Psychiatric disorder   Physical Exam Updated Vital Signs BP 133/87 (BP Location: Right Arm)   Pulse 83   Temp 98.8 F (37.1 C) (Oral)   Resp 19   Ht 6\' 1"  (1.854 m)   Wt (!) 148.3 kg   SpO2 98%   BMI 43.13 kg/m   Physical Exam Vitals and nursing note reviewed.  Constitutional:      General: He is not in acute distress.    Appearance: Normal appearance. He is well-developed. He is not diaphoretic.     Comments: Patient is alert, but not willing to answer most questions, smiling and giggling inappropriately  HENT:     Head: Normocephalic and atraumatic.  Eyes:     General:        Right eye: No discharge.        Left eye: No discharge.     Extraocular Movements: Extraocular movements intact.     Pupils: Pupils are equal, round, and reactive to light.  Cardiovascular:     Rate and Rhythm: Normal rate and regular rhythm.     Pulses: Normal pulses.     Heart sounds: Normal heart sounds. No murmur heard.   No friction rub. No gallop.  Pulmonary:     Effort: Pulmonary effort is normal. No respiratory distress.     Breath sounds: Normal breath sounds. No wheezing or rales.     Comments: Respirations equal and unlabored, patient able to  speak in full sentences, lungs clear to auscultation bilaterally  Abdominal:     General: Bowel sounds are normal. There is no distension.     Palpations: Abdomen is soft. There is no mass.     Tenderness: There is no abdominal tenderness. There is no guarding.     Comments: Abdomen soft, nondistended, nontender to palpation in all quadrants without guarding or peritoneal signs  Musculoskeletal:        General: No deformity.     Cervical back: Neck supple.     Right lower leg: No edema.     Left lower leg: No edema.  Skin:    General: Skin is warm and dry.     Capillary Refill: Capillary refill takes less than 2 seconds.  Neurological:     Mental Status: He is alert and oriented to person, place, and time.     Coordination: Coordination normal.     Comments: Speech is clear, able to follow commands but requires repetitive instructions  Mild facial weakness noted on the left when compared to right, but otherwise cranial nerves grossly intact Normal strength in upper and extremities bilaterally, patient will not move either of his lower extremities  sensation normal to light and sharp touch in bilateral upper extremities, patient will not answer questions regarding sensation in his lower extremities. Moves upper extremities without ataxia  Psychiatric:        Attention and Perception: He perceives auditory hallucinations.        Mood and Affect: Mood normal. Affect is labile.        Speech: He is noncommunicative.        Behavior: Behavior is uncooperative.     Comments: Patient intermittently appears to be responding to internal stimuli, laughing and talking to himself in the hallway.    ED Results / Procedures / Treatments   Labs (all labs  ordered are listed, but only abnormal results are displayed) Labs Reviewed  COMPREHENSIVE METABOLIC PANEL - Abnormal; Notable for the following components:      Result Value   CO2 20 (*)    Glucose, Bld 103 (*)    Total Protein 6.0 (*)    All  other components within normal limits  CBC WITH DIFFERENTIAL/PLATELET  CBG MONITORING, ED    EKG None  Radiology MR BRAIN WO CONTRAST  Result Date: 01/14/2021 CLINICAL DATA:  Decreased ambulation, history of multiple sclerosis EXAM: MRI HEAD WITHOUT CONTRAST TECHNIQUE: Multiplanar, multiecho pulse sequences of the brain and surrounding structures were obtained without intravenous contrast. COMPARISON:  09/13/2020 FINDINGS: Significant motion artifact is present. Brain: Patchy and confluent T2 hyperintensity in the supratentorial white matter is nonspecific but probably reflects reported multiple sclerosis. Burden is grossly unchanged. There is ex vacuo dilatation with prominence of ventricles and sulci greater than expected for age. There is unchanged thinning of the corpus callosum. There is no acute infarction or intracranial hemorrhage. There is no intracranial mass, mass effect, or edema. There is no hydrocephalus or extra-axial fluid collection. Vascular: Major vessel flow voids at the skull base are preserved. Skull and upper cervical spine: Normal marrow signal is preserved. Sinuses/Orbits: Paranasal sinuses are aerated. Orbits are unremarkable. Other: Sella is unremarkable.  Mastoid air cells are clear. IMPRESSION: Significantly motion degraded. Similar burden of chronic demyelination with suboptimal evaluation for new lesions. Electronically Signed   By: Guadlupe Spanish M.D.   On: 01/14/2021 10:12   MR CERVICAL SPINE WO CONTRAST  Result Date: 01/14/2021 CLINICAL DATA:  Decreased ambulation. EXAM: MRI CERVICAL SPINE WITHOUT CONTRAST TECHNIQUE: Multiplanar, multisequence MR imaging of the cervical spine was performed. No intravenous contrast was administered. COMPARISON:  09/13/2020 FINDINGS: Alignment: Generalized straightening of the cervical spine Vertebrae: No fracture, evidence of discitis, or bone lesion. Cord: T2 hyperintensity is seen at the level of the C4 and C5 cord on STIR imaging.  The extent is similar to a January 2022 comparison. No superimposed swelling is seen. Posterior Fossa, vertebral arteries, paraspinal tissues: Posterior fossa is reported separately. Disc levels: Negative for degenerative changes. IMPRESSION: 1. Severe motion degradation, axial imaging is nondiagnostic. 2. Chronic demyelination at C4 and C5. Electronically Signed   By: Marnee Spring M.D.   On: 01/14/2021 10:40    Procedures Procedures   Medications Ordered in ED Medications  LORazepam (ATIVAN) injection 1 mg (has no administration in time range)  ziprasidone (GEODON) injection 20 mg (20 mg Intramuscular Given 01/14/21 0459)  sterile water (preservative free) injection (  Given 01/14/21 0459)    ED Course  I have reviewed the triage vital signs and the nursing notes.  Pertinent labs & imaging results that were available during my care of the patient were reviewed by me and considered in my medical decision making (see chart for details).    MDM Rules/Calculators/A&P                         30 year old male sent from [facility] for medical clearance, known history of MS, and behavioral health reports that he has had worsening mobility since arriving at behavioral health urgent care.  Has had increasing difficulty ambulating with unsteady gait compared to arrival.  Patient became very agitated in triage, was running towards the door, of note he was able to ambulate normally at this time.  Receive Geodon, on my evaluation patient is alert but unwilling to answer most of my  questions, seems to have some left-sided facial weakness, but otherwise cranial nerves intact and patient with 5/5 strength and normal sensation in bilateral upper extremities.  Patient will not move the lower extremities at all or answer questions regarding sensation.  I reviewed patient's previous neurology notes and it seems that he has had a steady decline in mobility in general over the last few months and has had increasing  issues with gait disturbance so it is unclear how acute the symptoms are.  I consulted neurology and discussed case with Dr. Iver Nestle who recommends MRIs with and without contrast of the brain and cervical spine and thoracic spine.  Basic labs ordered as well.  They have had difficulties getting MRIs on patients previously, he seems to be fairly calm with Geodon we will also order as needed Ativan.  Patient went to MRI, there was no notification to myself or RN but MRI images of the brain and cervical spine were done without contrast and MRI of the thoracic spine was not completed as patient became agitated and started moving.  Patient was returned to the department without any discussion with MRI.  Will give additional Geodon and ensure that patient receives Ativan and ask MRI to attempt to complete imaging.  MRIs of the brain and cervical spine were severely motion degraded, imaging is not entirely diagnostic, there is chronic demyelination but no obvious acute lesions noted.  At shift change care signed out to [facility] pending thoracic spine MRI, afterwards may need to discuss imaging with neurology to determine disposition.  If patient does not require medical admission due to MS and MRI findings will need to reconsult psych for disposition.  Final Clinical Impression(s) / ED Diagnoses Final diagnoses:  Gait disturbance  Schizophrenia, unspecified type [facility])    Rx / DC Orders ED Discharge Orders     None        Legrand Rams 01/14/21 1516    Cathren Laine, MD 01/15/21 [phone]

## 2021-01-14 NOTE — ED Notes (Signed)
EMS arrived to transport pt to [facility]. Pt A&O x4, having difficulty ambulating. No signs of acute distress noted. Nursing staff and EMS assisted pt to stretcher and escorted through [facility] without issue. Belongings given to EMS to transport with pt. Safety maintained. Legal guardian successfully notified of transfer.

## 2021-01-14 NOTE — ED Notes (Signed)
Pt unable to stand up straight and having much difficulty ambulating. Cecilio Asper, NP made aware. MHT remains with pt to maintain safety.

## 2021-01-14 NOTE — ED Notes (Signed)
Pt's behavior began to escalated. Hitting his face and walking towards door beginning to yelling in an aggressive manner. He began asking someone to stop taking while no one was talking.  Secuirty called IM geodon given to pt. No room available at this time. Pt in recliner in triage.

## 2021-01-14 NOTE — ED Notes (Signed)
Pt yelling in triage saying he would kill everyone in here and be happy to go to prison for it.
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
## 2021-01-14 NOTE — ED Provider Notes (Signed)
Emergency Medicine Provider Triage Evaluation Note  Brandon [name] , a 30 y.o. male  was evaluated in triage.  Pt complains of headache and difficulty ambulating.  Has hx of MS.  Transferred from [facility] to ED d/t decreased ability to ambulate.  Review of Systems  Positive: headache Negative: Fever, hallucinations  Physical Exam  Ht 6\' 1"  (1.854 m)   Wt (!) 148.3 kg   BMI 43.13 kg/m  Gen:   Awake, no distress   Resp:  Normal effort  Neuro:   Slow to respond to questions Other:   Medical Decision Making  Medically screening exam initiated at 1:20 AM.  Appropriate orders placed.  Brandon [name] was informed that the remainder of the evaluation will be completed by another provider, this initial triage assessment does not replace that evaluation, and the importance of remaining in the ED until their evaluation is complete.     Brandon Lawrence, PA-C 01/14/21 0122    03/17/21, MD 01/14/21 [phone]

## 2021-01-14 NOTE — ED Notes (Signed)
Non-emergent EMS called for transport

## 2021-01-14 NOTE — ED Provider Notes (Signed)
Care of the patient received from [facility].  Please see her note for full HPI.  In short, 30 year old male with a history of MS, schizophrenia presented from our behavioral health urgent care for medical clearance.  Patient has been increasingly violent and agitated, BX noted that the patient is having increasing difficulty ambulating and seemed weaker and unsteady in bilateral lower extremities.  Patient with a history of MS, he has been followed by Dr. Anne Hahn with neurology and is on Ocrevus.  Prior provider discussed the patient's presentation with Dr. Iver Nestle with neurology.  She recommended MRI of the brain, cervical spine and thoracic spine.  Patient has had a history of difficulty staying still for MRIs in the past.  He had gotten a set of brain and cervical MRIs earlier, however they were not able to get the thoracic spine MRIs done.  Care of the patient received pending repeat MRIs of the brain with additional sedation.  Patient has required several rounds of Geodon here in the ER.   Patient again was not very cooperative for repeat MRI.  No significant changes noted.  I did speak with Dr.Bhagat got with my supervising surgeon Dr. Rubin Payor.  It is difficult to tell if he is having a new lesion.  He has been seen moving his lower extremities on my exam.  After extensive conversation with Dr. Iver Nestle, we decided that it is in the patient's best interest to be admitted for possibly sedated MRI imaging.  There is concern about treating him with IV steroids given however we do need to rule out a new lesion.  Spoke with Dr. Artis Flock with the hospitalist team will admit the patient for further evaluation and treatment.  Case discussed with Dr. Rubin Payor who is agreeable to the above plan and disposition  Physical Exam  BP (!) 131/103 (BP Location: Left Arm)   Pulse 78   Temp 98 F (36.7 C) (Oral)   Resp 16   Ht 6\' 1"  (1.854 m)   Wt (!) 148.3 kg   SpO2 100%   BMI 43.13 kg/m   Physical  Exam Vitals and nursing note reviewed.  Constitutional:      Appearance: Normal appearance. He is well-developed.  HENT:     Head: Normocephalic and atraumatic.  Eyes:     General:        Right eye: No discharge.        Left eye: No discharge.     Extraocular Movements: Extraocular movements intact.     Conjunctiva/sclera: Conjunctivae normal.  Cardiovascular:     Rate and Rhythm: Normal rate and regular rhythm.     Heart sounds: No murmur heard. Pulmonary:     Effort: Pulmonary effort is normal. No respiratory distress.     Breath sounds: Normal breath sounds.  Abdominal:     Palpations: Abdomen is soft.     Tenderness: There is no abdominal tenderness.  Musculoskeletal:        General: No swelling. Normal range of motion.     Cervical back: Neck supple.  Skin:    General: Skin is warm and dry.  Neurological:     General: No focal deficit present.     Mental Status: He is alert.     Comments: Questionable left-sided facial droop, the patient speaking with no word slurring.  He is moving all 4 extremities, moving upper extremities with no ataxia, seen restlessly moving his legs in the bed.  He is not answering questions, giggling inappropriately  and responding to external stimuli.  Psychiatric:        Attention and Perception: He perceives auditory hallucinations.        Mood and Affect: Affect is inappropriate.        Speech: He is noncommunicative.        Behavior: Behavior is agitated.    ED Course/Procedures     Procedures      Leone Brand 01/14/21 1739    Benjiman Core, MD 01/14/21 2340
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
## 2021-01-14 NOTE — ED Notes (Signed)
This RN called to check on MRI status for pt. Per MRI , pt will be transported within 30-40 min.

## 2021-01-15 DIAGNOSIS — Z7984 Long term (current) use of oral hypoglycemic drugs: Secondary | ICD-10-CM | POA: Diagnosis not present

## 2021-01-15 DIAGNOSIS — G319 Degenerative disease of nervous system, unspecified: Secondary | ICD-10-CM | POA: Diagnosis present

## 2021-01-15 DIAGNOSIS — Z86718 Personal history of other venous thrombosis and embolism: Secondary | ICD-10-CM | POA: Diagnosis not present

## 2021-01-15 DIAGNOSIS — Z7901 Long term (current) use of anticoagulants: Secondary | ICD-10-CM | POA: Diagnosis not present

## 2021-01-15 DIAGNOSIS — R269 Unspecified abnormalities of gait and mobility: Secondary | ICD-10-CM | POA: Diagnosis present

## 2021-01-15 DIAGNOSIS — F909 Attention-deficit hyperactivity disorder, unspecified type: Secondary | ICD-10-CM | POA: Diagnosis present

## 2021-01-15 DIAGNOSIS — Z8673 Personal history of transient ischemic attack (TIA), and cerebral infarction without residual deficits: Secondary | ICD-10-CM | POA: Diagnosis not present

## 2021-01-15 DIAGNOSIS — F17210 Nicotine dependence, cigarettes, uncomplicated: Secondary | ICD-10-CM | POA: Diagnosis present

## 2021-01-15 DIAGNOSIS — I10 Essential (primary) hypertension: Secondary | ICD-10-CM | POA: Diagnosis present

## 2021-01-15 DIAGNOSIS — Z6841 Body Mass Index (BMI) 40.0 and over, adult: Secondary | ICD-10-CM | POA: Diagnosis not present

## 2021-01-15 DIAGNOSIS — Z79899 Other long term (current) drug therapy: Secondary | ICD-10-CM | POA: Diagnosis not present

## 2021-01-15 DIAGNOSIS — F79 Unspecified intellectual disabilities: Secondary | ICD-10-CM | POA: Diagnosis present

## 2021-01-15 DIAGNOSIS — F209 Schizophrenia, unspecified: Secondary | ICD-10-CM

## 2021-01-15 DIAGNOSIS — Z86711 Personal history of pulmonary embolism: Secondary | ICD-10-CM | POA: Diagnosis not present

## 2021-01-15 DIAGNOSIS — Z833 Family history of diabetes mellitus: Secondary | ICD-10-CM | POA: Diagnosis not present

## 2021-01-15 DIAGNOSIS — G35 Multiple sclerosis: Secondary | ICD-10-CM | POA: Diagnosis present

## 2021-01-15 DIAGNOSIS — R2681 Unsteadiness on feet: Secondary | ICD-10-CM | POA: Diagnosis present

## 2021-01-15 DIAGNOSIS — Z20822 Contact with and (suspected) exposure to covid-19: Secondary | ICD-10-CM | POA: Diagnosis present

## 2021-01-15 DIAGNOSIS — F201 Disorganized schizophrenia: Secondary | ICD-10-CM | POA: Diagnosis present

## 2021-01-15 DIAGNOSIS — G4700 Insomnia, unspecified: Secondary | ICD-10-CM | POA: Diagnosis present

## 2021-01-15 DIAGNOSIS — R7303 Prediabetes: Secondary | ICD-10-CM | POA: Diagnosis present

## 2021-01-15 DIAGNOSIS — Z818 Family history of other mental and behavioral disorders: Secondary | ICD-10-CM | POA: Diagnosis not present

## 2021-01-15 DIAGNOSIS — F319 Bipolar disorder, unspecified: Secondary | ICD-10-CM | POA: Diagnosis present

## 2021-01-15 LAB — VITAMIN B12: Vitamin B-12: 738 pg/mL (ref 180–914)

## 2021-01-15 LAB — TSH: TSH: 1.424 u[IU]/mL (ref 0.350–4.500)

## 2021-01-15 MED ORDER — ZIPRASIDONE MESYLATE 20 MG IM SOLR
10.0000 mg | INTRAMUSCULAR | Status: DC | PRN
  Filled 2021-01-15: qty 20

## 2021-01-15 MED ORDER — HALOPERIDOL 5 MG PO TABS
10.0000 mg | ORAL_TABLET | Freq: Two times a day (BID) | ORAL | Status: DC
  Administered 2021-01-15 – 2021-01-17 (×4): 10 mg via ORAL
  Filled 2021-01-15 (×5): qty 2

## 2021-01-15 MED ORDER — HALOPERIDOL 5 MG PO TABS
5.0000 mg | ORAL_TABLET | Freq: Three times a day (TID) | ORAL | Status: DC
  Administered 2021-01-15: 5 mg via ORAL
  Filled 2021-01-15 (×3): qty 1

## 2021-01-15 MED ORDER — AMANTADINE HCL 100 MG PO CAPS
100.0000 mg | ORAL_CAPSULE | Freq: Two times a day (BID) | ORAL | Status: DC
  Administered 2021-01-15 – 2021-01-17 (×3): 100 mg via ORAL
  Filled 2021-01-15 (×5): qty 1

## 2021-01-15 MED ORDER — HALOPERIDOL 1 MG PO TABS
2.0000 mg | ORAL_TABLET | Freq: Once | ORAL | Status: AC
  Administered 2021-01-15: 2 mg via ORAL
  Filled 2021-01-15: qty 2

## 2021-01-15 MED ORDER — DIPHENHYDRAMINE HCL 25 MG PO CAPS
50.0000 mg | ORAL_CAPSULE | Freq: Four times a day (QID) | ORAL | Status: DC | PRN
  Administered 2021-01-15 (×2): 50 mg via ORAL
  Filled 2021-01-15 (×2): qty 2

## 2021-01-15 MED ORDER — ASENAPINE MALEATE 5 MG SL SUBL
10.0000 mg | SUBLINGUAL_TABLET | Freq: Three times a day (TID) | SUBLINGUAL | Status: DC
  Administered 2021-01-15 – 2021-01-17 (×5): 10 mg via SUBLINGUAL
  Filled 2021-01-15 (×8): qty 2

## 2021-01-15 MED ORDER — MELATONIN 5 MG PO TABS
10.0000 mg | ORAL_TABLET | Freq: Every day | ORAL | Status: DC
  Administered 2021-01-15 – 2021-01-16 (×2): 10 mg via ORAL
  Filled 2021-01-15 (×2): qty 2

## 2021-01-15 MED ORDER — METFORMIN HCL ER 500 MG PO TB24
500.0000 mg | ORAL_TABLET | Freq: Two times a day (BID) | ORAL | Status: DC
  Administered 2021-01-16 – 2021-01-17 (×3): 500 mg via ORAL
  Filled 2021-01-15 (×3): qty 1

## 2021-01-15 NOTE — Progress Notes (Signed)
PROGRESS NOTE   Brandon [name]  XBM:[id number] DOB: 1991-03-02 DOA: 01/14/2021 PCP: Maudie Flakes, FNP  Brief Narrative:  30 y/o BM known hi status post multiple hospitalizations for the same story of disorganized schizophrenia + intellectual disability + auditory hallucinations-last admitted and discharged 09/2020 Legal guardian Brunetta Genera [phone]  Discharged on Saphris, Cogentin, Haldol, Topamax  multiple sclerosis on Ocrevus injections--psychotropics seem to affect mentation, balance in the setting of his multiple sclerosis (in the past has been admitted for ataxia secondary to trazodone) Class II obesity BMI 43 Pulmonary embolism 08/11/2017  Seen 01/10/2021 Dr. Elder Love falls and ataxia-according to notes trazodone discontinued gabapentin started .patient was encouraged to stop gabapentin to see how gait responded-ultimate plan was to consider steroids, work-up  He was apparently "dropped off" 01/13/2021 at [facility]-and was recommended inpatient admission [facility] for continuous assessment-patient told mother that he was going to kill her allegedly per notes recommended inpatient admission 09/23/2020-attends day program Monday through Friday at [employer]--no longer has an ACT team member To the counselor on 7/10 he seemed to be having garbled speech intermittent staring off into space and would be listening apparently to voices  found to be?  Unable to ambulate bear weight (could however ambulate to the restroom??) Patient was transferred over to [facility] ED for evaluation  Found to have lower extremity swelling inability to ambulate ultimately-tried to leave the ED and was laughing hysterically?  But was ultimately redirectable--although made threats in the ED stating that he would kill everyone there and would be happy to go to prison for it Ultimately given Geodon 10 mg--not cooperative for repeat exam and could not get T-spine MRI  Hospital-Problem based course  Schizophrenia  with command delusions/hallucinations of homicide Patient was threatening to kill his mother--no longer has an ACT team Psychiatry is recommending inpatient psych placement Redirectable somnolent today Meds per psychiatry = Haldol 10 twice daily, Saphris 10 3 times daily before meals, Symmetrel 100 twice daily Continues to require one-to-one supervision [place] ataxia with possibility of?  Cervical/thoracic lesion from underlying MS Multiple sclerosis on Ocrevus follows with Dr. Anne Hahn Patient was able to ambulate to the bathroom this morning Per neurology needs MRI with contrast-he will only be able to get this under general anesthesia-ordered the same Monitor with recommendations per psychiatrist Unclear if MS is the etiology-patient has a history of ataxia in the past when gabapentin and trazodone were adjusted as per Dr. Anne Hahn as above TSH B12 ordered by psychiatry relatively normalized Class III obesity BMI 43 Outpatient discussion regarding weight loss Currently is on metformin 500 twice daily?  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   Difficult to place patient No       Consultants:  Psychiatry Neurology  Procedures: MRI  Antimicrobials: None   Subjective: Intermittenttly sleepy  on my exam received medication early this morning No chest pain no fever--sitter states that patient was  able to ambulate to the restroom independently this AM Bedside sitter states he has eaten breakfast and has been calm predominantly  Objective: Vitals:   01/14/21 2207 01/14/21 2318 01/14/21 2336 01/15/21 0737  BP: (!) 142/113 (!) 126/113 (!) 139/94 135/88  Pulse: 73 82 67 68  Resp: 20 20 17 18   Temp:  97.9 F (36.6 C)  97.8 F (36.6 C)  TempSrc:  Oral  Oral  SpO2: 100% 100% 100% 100%  Weight:      Height:       No intake or output data in the 24 hours ending 01/15/21 1030 Filed Weights   01/14/21 0104  Weight: (!) 148.3 kg    Examination:  Thick neck Mallampati 4 moderate dentition follows commands Strength bilaterally equal 5/5 upper extremities 5/5 lower extremities straight leg raises positive and no deficit to movement No restriction ROM in major joints Finger-nose-finger not tested, reflexes deferred   Data Reviewed: personally reviewed   CBC    Component Value Date/Time   WBC 7.2 01/14/2021 0123   RBC 5.19 01/14/2021 0123   HGB 13.6 01/14/2021 0123   HGB 13.4 11/01/2020 1436   HCT 42.9 01/14/2021 0123   HCT 43.2 11/01/2020 1436   PLT 197 01/14/2021 0123   PLT 209 11/01/2020 1436   MCV 82.7 01/14/2021 0123   MCV 82 11/01/2020 1436   MCH 26.2 01/14/2021 0123   MCHC 31.7 01/14/2021 0123   RDW 15.2 01/14/2021 0123   RDW 14.2 11/01/2020 1436   LYMPHSABS 2.0 01/14/2021 0123   LYMPHSABS 2.0 11/01/2020 1436   MONOABS 0.7 01/14/2021 0123   EOSABS 0.3 01/14/2021 0123   EOSABS 0.3 11/01/2020 1436   BASOSABS 0.0 01/14/2021 0123   BASOSABS 0.1 11/01/2020 1436   CMP Latest Ref Rng & Units 01/14/2021 11/01/2020 09/16/2020  Glucose 70 - 99 mg/dL 09/18/2020) 75 308(M)  BUN 6 - 20 mg/dL 14 14 16   Creatinine 0.61 - 1.24 mg/dL 578(I 6.96  Sodium 135 - 145 mmol/L 138 142 137  Potassium 3.5 - 5.1 mmol/L 4.0 4.4 3.5  Chloride 98 - 111 mmol/L 110 105 106  CO2 22 - 32 mmol/L 20(L) 22 24  Calcium 8.9 - 10.3 mg/dL 9.2 9.5 9.1  Total Protein 6.5 - 8.1 g/dL 6.0(L) 6.8 6.6   Total Bilirubin 0.3 - 1.2 mg/dL 0.6 0.2 0.6  Alkaline Phos 38 - 126 U/L 61 95 71  AST 15 - 41 U/L 17 14 15   ALT 0 - 44 U/L 36 25 24     Radiology Studies: MR BRAIN WO CONTRAST  Result Date: 01/14/2021 CLINICAL DATA:  Multiple sclerosis, new event. EXAM: MRI HEAD WITHOUT CONTRAST TECHNIQUE: Multiplanar, multiecho pulse sequences of the brain and surrounding structures were obtained without intravenous contrast. COMPARISON:  Same day cervical spine MRI 01/14/2021. Prior brain MRI examinations 01/14/2021 and earlier. FINDINGS: Brain: The examination is intermittently motion degraded, limiting evaluation. Most notably, there is moderate/severe motion degradation of the axial T2 sequence, severe motion degradation of the axial SWI sequence, severe motion degradation of the axial T1 weighted sequence and severe motion degradation of the coronal T2 sequence. Age advanced cerebral and cerebellar atrophy. Again demonstrated is advanced multifocal T2/FLAIR hyperintense signal abnormality within the subcortical/juxtacortical and deep periventricular white matter, as well as corpus callosum. No definitively new lesion is identified as compared to the prior brain MRI of 09/13/2020. No definite posterior  fossa lesions are identified. There is no acute infarct. Within described limitations, no intracranial mass, chronic intracranial blood products or extra-axial fluid collection is identified. No midline shift. Vascular: Expected proximal arterial flow voids. Skull and upper cervical spine: No focal marrow lesion. Sinuses/Orbits: Visualized orbits show no acute finding. Small right maxillary sinus mucous retention cyst at the imaged levels. IMPRESSION: Motion degraded examination, as described and limiting evaluation. Redemonstrated severe supratentorial white matter disease, not appreciably changed from the brain MRI of 09/13/2020. Findings are compatible with the provided history of multiple sclerosis. Age-advanced  generalized cerebral and cerebellar atrophy. Electronically Signed   By: Jackey Loge DO   On: 01/14/2021 15:52   MR BRAIN WO CONTRAST  Result Date: 01/14/2021 CLINICAL DATA:  Decreased ambulation, history of multiple sclerosis EXAM: MRI HEAD WITHOUT CONTRAST TECHNIQUE: Multiplanar, multiecho pulse sequences of the brain and surrounding structures were obtained without intravenous contrast. COMPARISON:  09/13/2020 FINDINGS: Significant motion artifact is present. Brain: Patchy and confluent T2 hyperintensity in the supratentorial white matter is nonspecific but probably reflects reported multiple sclerosis. Burden is grossly unchanged. There is ex vacuo dilatation with prominence of ventricles and sulci greater than expected for age. There is unchanged thinning of the corpus callosum. There is no acute infarction or intracranial hemorrhage. There is no intracranial mass, mass effect, or edema. There is no hydrocephalus or extra-axial fluid collection. Vascular: Major vessel flow voids at the skull base are preserved. Skull and upper cervical spine: Normal marrow signal is preserved. Sinuses/Orbits: Paranasal sinuses are aerated. Orbits are unremarkable. Other: Sella is unremarkable.  Mastoid air cells are clear. IMPRESSION: Significantly motion degraded. Similar burden of chronic demyelination with suboptimal evaluation for new lesions. Electronically Signed   By: Guadlupe Spanish M.D.   On: 01/14/2021 10:12   MR CERVICAL SPINE WO CONTRAST  Result Date: 01/14/2021 CLINICAL DATA:  Decreased ambulation. EXAM: MRI CERVICAL SPINE WITHOUT CONTRAST TECHNIQUE: Multiplanar, multisequence MR imaging of the cervical spine was performed. No intravenous contrast was administered. COMPARISON:  09/13/2020 FINDINGS: Alignment: Generalized straightening of the cervical spine Vertebrae: No fracture, evidence of discitis, or bone lesion. Cord: T2 hyperintensity is seen at the level of the C4 and C5 cord on STIR imaging. The  extent is similar to a January 2022 comparison. No superimposed swelling is seen. Posterior Fossa, vertebral arteries, paraspinal tissues: Posterior fossa is reported separately. Disc levels: Negative for degenerative changes. IMPRESSION: 1. Severe motion degradation, axial imaging is nondiagnostic. 2. Chronic demyelination at C4 and C5. Electronically Signed   By: Marnee Spring M.D.   On: 01/14/2021 10:40     Scheduled Meds:  amLODipine  5 mg Oral Daily   apixaban  5 mg Oral BID   docusate sodium  400 mg Oral QHS   haloperidol  5 mg Oral TID   latanoprost  1 drop Both Eyes QHS   sodium chloride flush  3 mL Intravenous Q12H   Continuous Infusions:  sodium chloride       LOS: 0 days   Time spent: 41  Rhetta Mura, MD [facility] To contact the attending provider between 7A-7P or the covering provider during after hours 7P-7A, please log into the web site [email or web address] and access using universal [place] password for that web site. If you do not have the password, please call the hospital operator.  01/15/2021, 10:30 AM

## 2021-01-15 NOTE — TOC Progression Note (Signed)
Transition of Care [facility]) - Progression Note    Patient Details  Name: Brandon [name] MRN: [id number] Date of Birth: 07-21-1990  Transition of [facility]) CM/SW Contact  Ralene Bathe, LCSWA Phone Number: 01/15/2021, 3:22 PM  Clinical Narrative:    15:23-  CSW contacted behavioral health to inquire about whether the patient can return once he is medically ready.  CSW called behavorial health at [phone] and was directed to call the admissions coordinator at [phone].  CSW called this number and there was no answer.  CSW was unable to leave a message.          Expected Discharge Plan and Services                                                 Social Determinants of Health (SDOH) Interventions    Readmission Risk Interventions No flowsheet data found.

## 2021-01-15 NOTE — Telephone Encounter (Signed)
Message acknowledged and reviewed by provider.

## 2021-01-15 NOTE — Progress Notes (Signed)
Addend  Remains calm at this time per nursing  More awake on my exam in the afternoon:-  Reflexes slightly brisk 2/3 sensory intact bilaterally Finger-nose-finger relatively preserved however a little tremulous at times Refuses to do heel-to-shin testing-did not test for dysdiadochokinesia
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
## 2021-01-15 NOTE — Plan of Care (Signed)
Unfortunately, this patient's comorbidities of multiple sclerosis and psychiatric disturbances have been challenging to comanage.  It is unclear whether his recent gait disturbances are secondary to multiple sclerosis or side effects from psychiatric medications, which have been adjusted accordingly with worsening of his psychosis.  In turn, the psychosis has made it challenging to definitively assess for multiple sclerosis disease activity given the patient participates only intermittently in examination and is unable to tolerate MRI scans of diagnostic quality.  Per chart review, primary team has been able to arrange for MRI scans under anesthesia, with thoracic and cervical spine currently ordered.  I am additionally adding an order for MRI brain given his recent images have been nondiagnostic.  With definitive confirmation on whether or not acute multiple sclerosis flare is contributing to his gait disturbance, we can make progress on appropriate treatment for this patient.  Brooke Dare MD-PhD [facility] [phone]  Available 7 AM to 7 PM, outside these hours please contact Neurologist on call listed on AMION

## 2021-01-16 ENCOUNTER — Inpatient Hospital Stay (HOSPITAL_COMMUNITY): Payer: Medicare Other | Admitting: Certified Registered"

## 2021-01-16 ENCOUNTER — Inpatient Hospital Stay (HOSPITAL_COMMUNITY): Payer: Medicare Other

## 2021-01-16 ENCOUNTER — Encounter (HOSPITAL_COMMUNITY): Payer: Self-pay | Admitting: Family Medicine

## 2021-01-16 ENCOUNTER — Encounter (HOSPITAL_COMMUNITY)
Admission: EM | Disposition: A | Discharge status: Home / Self Care | Payer: Self-pay | Source: Home / Self Care | Attending: Internal Medicine

## 2021-01-16 HISTORY — PX: RADIOLOGY WITH ANESTHESIA: SHX6223

## 2021-01-16 LAB — COMPREHENSIVE METABOLIC PANEL
ALT: 44 U/L (ref 0–44)
AST: 16 U/L (ref 15–41)
Albumin: 4.2 g/dL (ref 3.5–5.0)
Alkaline Phosphatase: 76 U/L (ref 38–126)
Anion gap: 9 (ref 5–15)
BUN: 6 mg/dL (ref 6–20)
CO2: 21 mmol/L — ABNORMAL LOW (ref 22–32)
Calcium: 9.5 mg/dL (ref 8.9–10.3)
Chloride: 108 mmol/L (ref 98–111)
Creatinine, Ser: 0.82 mg/dL (ref 0.61–1.24)
GFR, Estimated: >60 mL/min (ref >60)
Glucose, Bld: 126 mg/dL — ABNORMAL HIGH (ref 70–99)
Potassium: 3.6 mmol/L (ref 3.5–5.1)
Sodium: 138 mmol/L (ref 135–145)
Total Bilirubin: 0.7 mg/dL (ref 0.3–1.2)
Total Protein: 6.7 g/dL (ref 6.5–8.1)

## 2021-01-16 LAB — GLUCOSE, CAPILLARY
Glucose-Capillary: 138 mg/dL — ABNORMAL HIGH (ref 70–99)
Glucose-Capillary: 120 mg/dL — ABNORMAL HIGH (ref 70–99)

## 2021-01-16 SURGERY — MRI WITH ANESTHESIA
Anesthesia: General

## 2021-01-16 MED ORDER — AMISULPRIDE (ANTIEMETIC) 5 MG/2ML IV SOLN: 10.0000 mg | Freq: Once | INTRAVENOUS | Status: DC | PRN

## 2021-01-16 MED ORDER — ORAL CARE MOUTH RINSE: 15.0000 mL | Freq: Once | OROMUCOSAL | Status: DC

## 2021-01-16 MED ORDER — DEXAMETHASONE SODIUM PHOSPHATE 10 MG/ML IJ SOLN
INTRAMUSCULAR | Status: DC | PRN
  Administered 2021-01-16: 5 mg via INTRAVENOUS

## 2021-01-16 MED ORDER — ONDANSETRON HCL 4 MG/2ML IJ SOLN: 4.0000 mg | Freq: Once | INTRAMUSCULAR | Status: DC | PRN

## 2021-01-16 MED ORDER — OXYCODONE HCL 5 MG PO TABS: 5.0000 mg | ORAL_TABLET | Freq: Once | ORAL | Status: DC | PRN

## 2021-01-16 MED ORDER — ONDANSETRON HCL 4 MG/2ML IJ SOLN
INTRAMUSCULAR | Status: DC | PRN
  Administered 2021-01-16: 4 mg via INTRAVENOUS

## 2021-01-16 MED ORDER — LIDOCAINE 2% (20 MG/ML) 5 ML SYRINGE
INTRAMUSCULAR | Status: DC | PRN
  Administered 2021-01-16: 100 mg via INTRAVENOUS

## 2021-01-16 MED ORDER — GADOBUTROL 1 MMOL/ML IV SOLN
10.0000 mL | Freq: Once | INTRAVENOUS | Status: AC | PRN
  Administered 2021-01-16: 10 mL via INTRAVENOUS

## 2021-01-16 MED ORDER — CHLORHEXIDINE GLUCONATE 0.12 % MT SOLN: 15.0000 mL | Freq: Once | OROMUCOSAL | Status: DC

## 2021-01-16 MED ORDER — FENTANYL CITRATE (PF) 100 MCG/2ML IJ SOLN: 25.0000 ug | INTRAMUSCULAR | Status: DC | PRN

## 2021-01-16 MED ORDER — OXYCODONE HCL 5 MG/5ML PO SOLN: 5.0000 mg | Freq: Once | ORAL | Status: DC | PRN

## 2021-01-16 MED ORDER — LACTATED RINGERS IV SOLN: INTRAVENOUS | Status: DC

## 2021-01-16 MED ORDER — PHENYLEPHRINE 40 MCG/ML (10ML) SYRINGE FOR IV PUSH (FOR BLOOD PRESSURE SUPPORT)
PREFILLED_SYRINGE | INTRAVENOUS | Status: DC | PRN
  Administered 2021-01-16 (×2): 80 ug via INTRAVENOUS

## 2021-01-16 MED ORDER — PROPOFOL 10 MG/ML IV BOLUS
INTRAVENOUS | Status: DC | PRN
  Administered 2021-01-16: 10 mg via INTRAVENOUS
  Administered 2021-01-16: 200 mg via INTRAVENOUS
  Administered 2021-01-16: 20 mg via INTRAVENOUS

## 2021-01-16 NOTE — Progress Notes (Signed)
Patient left floor with OR tech to MRI. Sitter at bedside for safety.

## 2021-01-16 NOTE — Plan of Care (Addendum)
MRI brain, cervical spine, and thoracic spine personally reviewed.  Agree with radiology that there is no clear contrast-enhancement concerning for acute MS flare that would warrant additional immunosuppressive treatment or consideration of change in his disease modifying treatment.  This matches Dr. Shelbie Hutching examination which is essentially the patient's baseline.  Appreciate involvement of psychiatry for stabilization of his psychotic behavior No neurologic indication for continued hospitalization at this time Neurology will be available on an as-needed basis going forward, please consult if new questions arise.  Brooke Dare MD-PhD [facility] [phone]  Available 7 AM to 7 PM, outside these hours please contact Neurologist on call listed on AMION

## 2021-01-16 NOTE — Progress Notes (Signed)
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 Elder Love falls and ataxia-according to notes trazodone discontinued gabapentin started .patient was encouraged to stop gabapentin to see how gait responded-ultimate plan was to consider steroids, work-up   He was apparently "dropped off" 01/13/2021 at [facility]-and was recommended inpatient admission [facility] for continuous assessment-patient told mother that he was going to kill her allegedly per notes recommended inpatient admission 09/23/2020-attends day program Monday through Friday at [employer]--no longer has an ACT team member To the counselor on 7/10 he seemed to be having garbled speech intermittent staring off into space and would be listening apparently to voices   found to be?  Unable to ambulate bear weight (could however ambulate to the restroom??) Patient was transferred over to [facility] ED for evaluation   Found to have lower extremity swelling inability to ambulate ultimately-tried to leave the ED and was laughing hysterically?  But was ultimately redirectable--although made threats in the ED stating that he would kill everyone there and would be happy to go to prison for it Ultimately given Geodon 10 mg--not cooperative for repeat exam and could not get T-spine MRI  7/13- pt returned from MRI with  anesthesia.   Consultants:  Neurology, psychiatry  Procedures:   Antimicrobials:      Subjective: Sleepy but answers my questions sitter at bedside.  No shortness of breath or chest pain.  No abdominal pain.  Sitter stated he ate.  Objective: Vitals:   01/14/21 2336 01/15/21 0737 01/15/21 1612 01/15/21 2030  BP: (!) 139/94 135/88 (!) 125/98 132/88  Pulse: 67 68 86 90  Resp: 17 18 16 16   Temp:  97.8 F (36.6 C) 97.9 F (36.6 C) 97.7 F (36.5 C)  TempSrc:  Oral Oral Oral  SpO2: 100% 100% 100% 99%  Weight:      Height:        Intake/Output Summary (Last 24 hours) at 01/16/2021 01/18/2021 Last data filed at 01/15/2021 1700 Gross per 24 hour  Intake 720 ml  Output --  Net 720 ml   Filed Weights   01/14/21 0104  Weight: (!) 148.3 kg    Examination:  General exam: Appears calm and comfortable, NAD Respiratory system: Clear to auscultation. Respiratory effort normal. Cardiovascular system: S1 & S2 heard, RRR. No gallop Gastrointestinal system: Abdomen is nondistended, soft and nontender.  Normal bowel sounds heard. Central nervous system: alert, awake.  Extremities: no edema Psychiatry: Mood & affect in current appropriate.     Data Reviewed: I have personally reviewed following labs and imaging studies  CBC: Recent Labs  Lab 01/14/21 0123  WBC 7.2  NEUTROABS 4.1  HGB 13.6  HCT 42.9  MCV 82.7  PLT 197   Basic Metabolic Panel: Recent Labs  Lab 01/14/21 0123 01/16/21 0428  NA 138 138  K 4.0 3.6  CL 110 108  CO2 20* 21*  GLUCOSE 103* 126*  BUN 14 6  CREATININE 1.07  0.82  CALCIUM 9.2 9.5   GFR: Estimated Creatinine Clearance: 203.6 mL/min (by C-G formula based on SCr of 0.82 mg/dL). Liver Function Tests: Recent Labs  Lab 01/14/21 0123 01/16/21 0428  AST 17 16  ALT 36 44  ALKPHOS 61 76  BILITOT 0.6 0.7  PROT 6.0* 6.7  ALBUMIN 4.0 4.2   No results for input(s): LIPASE, AMYLASE in the last 168 hours. No results for input(s): AMMONIA in the last  168 hours. Coagulation Profile: No results for input(s): INR, PROTIME in the last 168 hours. Cardiac Enzymes: No results for input(s): CKTOTAL, CKMB, CKMBINDEX, TROPONINI in the last 168 hours. BNP (last 3 results) No results for input(s): PROBNP in the last 8760 hours. HbA1C: No results for input(s): HGBA1C in the last 72 hours. CBG: Recent Labs  Lab 01/16/21 0828  GLUCAP 138*   Lipid Profile: No results for input(s): CHOL, HDL, LDLCALC, TRIG, CHOLHDL, LDLDIRECT in the last 72 hours. Thyroid Function Tests: Recent Labs    01/15/21 0303  TSH 1.424   Anemia Panel: Recent Labs    01/15/21 0303  VITAMINB12 738   Sepsis Labs: No results for input(s): PROCALCITON, LATICACIDVEN in the last 168 hours.  Recent Results (from the past 240 hour(s))  Resp Panel by RT-PCR (Flu A&B, Covid) Nasopharyngeal Swab     Status: None   Collection Time: 01/14/21  7:41 PM   Specimen: Nasopharyngeal Swab; Nasopharyngeal(NP) swabs in vial transport medium  Result Value Ref Range Status   SARS Coronavirus 2 by RT PCR NEGATIVE NEGATIVE Final    Comment: (NOTE) SARS-CoV-2 target nucleic acids are NOT DETECTED.  The SARS-CoV-2 RNA is generally detectable in upper respiratory specimens during the acute phase of infection. The lowest concentration of SARS-CoV-2 viral copies this assay can detect is 138 copies/mL. A negative result does not preclude SARS-Cov-2 infection and should not be used as the sole basis for treatment or other patient management decisions. A negative result may occur with  improper specimen collection/handling, submission of specimen other than nasopharyngeal swab, presence of viral mutation(s) within the areas targeted by this assay, and inadequate number of viral copies(<138 copies/mL). A negative result must be combined with clinical observations, patient history, and epidemiological information. The expected result is Negative.  Fact Sheet for Patients:   BloggerCourse.com  Fact Sheet for Healthcare Providers:  SeriousBroker.it  This test is no t yet approved or cleared by the [place] FDA and  has been authorized for detection and/or diagnosis of SARS-CoV-2 by FDA under an Emergency Use Authorization (EUA). This EUA will remain  in effect (meaning this test can be used) for the duration of the COVID-19 declaration under Section 564(b)(1) of the Act, 21 U.S.C.section 360bbb-3(b)(1), unless the authorization is terminated  or revoked sooner.       Influenza A by PCR NEGATIVE NEGATIVE Final   Influenza B by PCR NEGATIVE NEGATIVE Final    Comment: (NOTE) The Xpert Xpress SARS-CoV-2/FLU/RSV plus assay is intended as an aid in the diagnosis of influenza from Nasopharyngeal swab specimens and should not be used as a sole basis for treatment. Nasal washings and aspirates are unacceptable for Xpert Xpress SARS-CoV-2/FLU/RSV testing.  Fact Sheet for Patients: BloggerCourse.com  Fact Sheet for Healthcare Providers: SeriousBroker.it  This test is not yet approved or cleared by the [place] FDA and has been authorized for detection and/or diagnosis of SARS-CoV-2 by FDA under an Emergency Use Authorization (EUA). This EUA will remain in effect (meaning this test can be used)  for the duration of the COVID-19 declaration under Section 564(b)(1) of the Act, 21 U.S.C. section 360bbb-3(b)(1), unless the authorization is terminated or revoked.  Performed at [facility], [address] [place], [place] [address]          Radiology Studies: MR BRAIN WO CONTRAST  Result Date: 01/14/2021 CLINICAL DATA:  Multiple sclerosis, new event. EXAM: MRI HEAD WITHOUT CONTRAST TECHNIQUE: Multiplanar, multiecho pulse sequences of the brain and surrounding structures were obtained without intravenous contrast. COMPARISON:  Same day  cervical spine MRI 01/14/2021. Prior brain MRI examinations 01/14/2021 and earlier. FINDINGS: Brain: The examination is intermittently motion degraded, limiting evaluation. Most notably, there is moderate/severe motion degradation of the axial T2 sequence, severe motion degradation of the axial SWI sequence, severe motion degradation of the axial T1 weighted sequence and severe motion degradation of the coronal T2 sequence. Age advanced cerebral and cerebellar atrophy. Again demonstrated is advanced multifocal T2/FLAIR hyperintense signal abnormality within the subcortical/juxtacortical and deep periventricular white matter, as well as corpus callosum. No definitively new lesion is identified as compared to the prior brain MRI of 09/13/2020. No definite posterior fossa lesions are identified. There is no acute infarct. Within described limitations, no intracranial mass, chronic intracranial blood products or extra-axial fluid collection is identified. No midline shift. Vascular: Expected proximal arterial flow voids. Skull and upper cervical spine: No focal marrow lesion. Sinuses/Orbits: Visualized orbits show no acute finding. Small right maxillary sinus mucous retention cyst at the imaged levels. IMPRESSION: Motion degraded examination, as described and limiting evaluation. Redemonstrated severe supratentorial white matter disease, not appreciably changed from the brain MRI of 09/13/2020. Findings are compatible with the provided history of multiple sclerosis. Age-advanced generalized cerebral and cerebellar atrophy. Electronically Signed   By: Jackey LogeKyle  Golden DO   On: 01/14/2021 15:52   MR BRAIN WO CONTRAST  Result Date: 01/14/2021 CLINICAL DATA:  Decreased ambulation, history of multiple sclerosis EXAM: MRI HEAD WITHOUT CONTRAST TECHNIQUE: Multiplanar, multiecho pulse sequences of the brain and surrounding structures were obtained without intravenous contrast. COMPARISON:  09/13/2020 FINDINGS: Significant  motion artifact is present. Brain: Patchy and confluent T2 hyperintensity in the supratentorial white matter is nonspecific but probably reflects reported multiple sclerosis. Burden is grossly unchanged. There is ex vacuo dilatation with prominence of ventricles and sulci greater than expected for age. There is unchanged thinning of the corpus callosum. There is no acute infarction or intracranial hemorrhage. There is no intracranial mass, mass effect, or edema. There is no hydrocephalus or extra-axial fluid collection. Vascular: Major vessel flow voids at the skull base are preserved. Skull and upper cervical spine: Normal marrow signal is preserved. Sinuses/Orbits: Paranasal sinuses are aerated. Orbits are unremarkable. Other: Sella is unremarkable.  Mastoid air cells are clear. IMPRESSION: Significantly motion degraded. Similar burden of chronic demyelination with suboptimal evaluation for new lesions. Electronically Signed   By: Guadlupe SpanishPraneil  Patel M.D.   On: 01/14/2021 10:12   MR CERVICAL SPINE WO CONTRAST  Result Date: 01/14/2021 CLINICAL DATA:  Decreased ambulation. EXAM: MRI CERVICAL SPINE WITHOUT CONTRAST TECHNIQUE: Multiplanar, multisequence MR imaging of the cervical spine was performed. No intravenous contrast was administered. COMPARISON:  09/13/2020 FINDINGS: Alignment: Generalized straightening of the cervical spine Vertebrae: No fracture, evidence of discitis, or bone lesion. Cord: T2 hyperintensity is seen at the level of the C4 and C5 cord on STIR imaging. The extent is similar to a January 2022 comparison. No superimposed swelling is seen. Posterior Fossa, vertebral arteries, paraspinal tissues: Posterior fossa is reported separately.  Disc levels: Negative for degenerative changes. IMPRESSION: 1. Severe motion degradation, axial imaging is nondiagnostic. 2. Chronic demyelination at C4 and C5. Electronically Signed   By: Marnee Spring M.D.   On: 01/14/2021 10:40        Scheduled Meds:   amantadine  100 mg Oral BID   amLODipine  5 mg Oral Daily   apixaban  5 mg Oral BID   asenapine  10 mg Sublingual TID AC   docusate sodium  400 mg Oral QHS   haloperidol  10 mg Oral BID   latanoprost  1 drop Both Eyes QHS   melatonin  10 mg Oral QHS   metFORMIN  500 mg Oral BID WC   sodium chloride flush  3 mL Intravenous Q12H   Continuous Infusions:  sodium chloride      Assessment & Plan:   Principal Problem:   Unsteady gait Active Problems:   HTN (hypertension)   Multiple sclerosis (HCC)   Disorganized schizophrenia (HCC)   Pulmonary embolism (HCC)   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  F/u with psychiatry   Falls ataxia with possibility of?  Cervical/thoracic lesion from underlying MS Multiple sclerosis on Ocrevus follows with Dr. Anne Hahn Unclear if MS is the etiology-patient has a history of ataxia in the past when gabapentin and trazodone were adjusted as per Dr. Anne Hahn as above TSH B12 ordered by psychiatry relatively normalized 7/13- went for MRI brain underanesthesia jtoday, will f/u Neurology following.  Class III obesity BMI 43 Currently is on metformin 500 twice daily?  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   Difficult to place patient No            LOS: 1 day   Time spent: 35 min  with >50% on coc    Lynn Ito, MD [facility] Pager [phone]  If 7PM-7AM, please contact night-coverage 01/16/2021, 8:32 AM

## 2021-01-16 NOTE — Anesthesia Procedure Notes (Signed)
Procedure Name: LMA Insertion Date/Time: 01/16/2021 10:11 AM Performed by: Lonia Mad, CRNA Pre-anesthesia Checklist: Patient identified, Emergency Drugs available, Suction available, Patient being monitored and Timeout performed Patient Re-evaluated:Patient Re-evaluated prior to induction Oxygen Delivery Method: Circle system utilized Preoxygenation: Pre-oxygenation with 100% oxygen Induction Type: IV induction Ventilation: Mask ventilation without difficulty LMA: LMA with gastric port inserted LMA Size: 5.0 Number of attempts: 1

## 2021-01-16 NOTE — Progress Notes (Addendum)
Telephone consent received and witnessed by two RNs for an MRI with anesthesia care from the mother [facility]. MRI questionnaire also received via telephone from the mother. Form also faxed to the MRI.

## 2021-01-16 NOTE — Anesthesia Postprocedure Evaluation (Signed)
Anesthesia Post Note  Patient: Brandon [name]  Procedure(s) Performed: MRI WITH ANESTHESIA CERVICAL AND THORASIC SPINE WITH AND WITHOUT CONTRAST     Patient location during evaluation: PACU Anesthesia Type: General Level of consciousness: awake and alert Pain management: pain level controlled Vital Signs Assessment: post-procedure vital signs reviewed and stable Respiratory status: spontaneous breathing, nonlabored ventilation and respiratory function stable Cardiovascular status: blood pressure returned to baseline and stable Postop Assessment: no apparent nausea or vomiting Anesthetic complications: no   No notable events documented.  Last Vitals:  Vitals:   01/16/21 1255 01/16/21 1312  BP: (!) 135/93 (!) 147/98  Pulse: 85 79  Resp: 15 17  Temp: (!) 36.1 C 36.9 C  SpO2: 100% 100%    Last Pain:  Vitals:   01/16/21 1312  TempSrc: Oral  PainSc:                  Lucretia Kern

## 2021-01-16 NOTE — Anesthesia Preprocedure Evaluation (Addendum)
Anesthesia Evaluation  Patient identified by MRN, date of birth, ID band Patient confused    Reviewed: Allergy & Precautions, NPO status , Patient's Chart, lab work & pertinent test results  History of Anesthesia Complications Negative for: history of anesthetic complications  Airway Mallampati: III  TM Distance: >3 FB Neck ROM: Full    Dental  (+) Dental Advisory Given   Pulmonary Current Smoker and Patient abstained from smoking., PE   Pulmonary exam normal        Cardiovascular hypertension, Normal cardiovascular exam     Neuro/Psych Bipolar Disorder Schizophrenia Multiple sclerosis    GI/Hepatic negative GI ROS, Neg liver ROS,   Endo/Other  Morbid obesity  Renal/GU negative Renal ROS  negative genitourinary   Musculoskeletal negative musculoskeletal ROS (+)   Abdominal   Peds  Hematology negative hematology ROS (+)   Anesthesia Other Findings   Reproductive/Obstetrics                           Anesthesia Physical Anesthesia Plan  ASA: 3  Anesthesia Plan: General   Post-op Pain Management:    Induction: Intravenous  PONV Risk Score and Plan: 1 and Ondansetron, Dexamethasone, Treatment may vary due to age or medical condition and Midazolam  Airway Management Planned: LMA  Additional Equipment: None  Intra-op Plan:   Post-operative Plan: Extubation in OR  Informed Consent: I have reviewed the patients History and Physical, chart, labs and discussed the procedure including the risks, benefits and alternatives for the proposed anesthesia with the patient or authorized representative who has indicated his/her understanding and acceptance.     Dental advisory given and Consent reviewed with POA  Plan Discussed with:   Anesthesia Plan Comments:        Anesthesia Quick Evaluation

## 2021-01-16 NOTE — Transfer of Care (Signed)
Immediate Anesthesia Transfer of Care Note  Patient: [name]  Procedure(s) Performed: MRI WITH ANESTHESIA CERVICAL AND THORASIC SPINE WITH AND WITHOUT CONTRAST  Patient Location: PACU  Anesthesia Type:General  Level of Consciousness: drowsy  Airway & Oxygen Therapy: Patient Spontanous Breathing  Post-op Assessment: Report given to RN and Post -op Vital signs reviewed and stable  Post vital signs: Reviewed and stable  Last Vitals:  Vitals Value Taken Time  BP 142/100 01/16/21 1241  Temp    Pulse 79 01/16/21 1245  Resp 12 01/16/21 1245  SpO2 99 % 01/16/21 1245  Vitals shown include unvalidated device data.  Last Pain:  Vitals:   01/16/21 0720  TempSrc:   PainSc: 0-No pain         Complications: No notable events documented.

## 2021-01-17 ENCOUNTER — Encounter (HOSPITAL_COMMUNITY): Payer: Self-pay | Admitting: Radiology

## 2021-01-17 MED ORDER — MELATONIN 10 MG PO CAPS: 10.0000 mg | ORAL_CAPSULE | Freq: Every day | ORAL | 0 refills | Status: AC

## 2021-01-17 MED ORDER — HALOPERIDOL 10 MG PO TABS: 10.0000 mg | ORAL_TABLET | Freq: Two times a day (BID) | ORAL | 0 refills | Status: DC

## 2021-01-17 NOTE — Discharge Summary (Signed)
Brandon [name] WGN:[id number] DOB: May 08, 1991 DOA: 01/14/2021  PCP: Maudie FlakesAnderson, Brandon D, FNP  Admit date: 01/14/2021 Discharge date: 01/17/2021  Admitted From: Home Disposition: Home  Recommendations for Outpatient Follow-up:  Follow up with PCP in 1 week Please obtain BMP/CBC in one week Please follow up primary psychiatrist     Discharge Condition:Stable CODE STATUS: Full Diet recommendation: Carb modified    Brief/Interim Summary: Per HQI:ONGEXBMHPI:Brandon [name] is a 30 y.o. male with medical history significant of MS followed by neurology on Ocrevus, HTN, PE and schizophrenia who presented to ED [facility] for medical clearance due to unsteady gait and weakness in bilateral lower extremities. He was having increasing difficulty ambulating.  He was at behavorial health as he was becoming more aggressive toward his mother and the voices were telling him to beat her and bloody her in the face.  Was seen by his neurologist in April with complaints of progressive weakness and worsening mobility. More complaints of the axial muscles.  Was advised on 01/10/21 to stop the gabapentin as may have been source of gait instability.  He was admitted to the hospital.  Neurology and psychiatry was consulted.   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  He had one-on-one sitter. Since neurology cleared him psychiatry was reconsulted. Per psych Patient appears to be responding well to psychiatric medication adjustments as his behavior appears to have returned to baseline and patient has not been seen responding to internal stimuli. Patient overall appears at his baseline an no longer meets criteria for need for inpatient stabilization at psych hospital. This patient requires 2 SGAs due to his hx of refractory to one antipsychotic agent despite attempting  with at least 3 antipsychotics in the past.  This was confirmed by psych with patient's mother she is ok with the regimen. Continue Haldol 10mg  BID, some patient's require PO with [facility]I for stabilization - Continue haldol deconaoate OP - Continue Saphris 10mg  TID - Continue Symmetrel 100mg  BID      Falls ataxia with possibility of?  Cervical/thoracic lesion from underlying MS Multiple sclerosis on Ocrevus follows with Dr. Anne [name] Unclear if MS is the etiology-patient has a history of ataxia in the past when gabapentin and trazodone were adjusted as per Dr. Anne [name] as above TSH B12 ordered by psychiatry relatively normalized -Patient underwent MRI brain, cervical spine, thoracic spine yesterday under anesthesia.See MRI reports below. After neurology reviewed MRI reports they agree with radiology that there is no clear contrast-enhancement concerning for acute MS flare that would warrant additional immunosuppressive treatment or consideration of change in his disease modifying treatment. No neurologic indication for continued hospitalization at this time. F/u with primarcy neurologist.   Class III obesity BMI 43 Currently is on metformin 500 twice daily.  Unclear for weight loss or for prediabetes . 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(HCC)    Discharge Instructions  Discharge Instructions     Call MD for:  persistant nausea and vomiting   Complete by: As directed    Diet - low sodium heart healthy   Complete by: As directed  Diet general   Complete by: As directed    Discharge instructions   Complete by: As directed    Follow up with primary psychiatrist   Increase activity slowly   Complete by: As directed        Allergies as of 01/17/2021   No Known Allergies      Medication List     STOP taking these medications    ADVIL PM PO   benztropine 1 MG tablet Commonly known as: COGENTIN       TAKE these medications    acetaminophen 500 MG tablet Commonly known as: TYLENOL Take 1 tablet (500 mg total) by mouth every 6 (six) hours as needed. What changed: reasons to take this   amantadine 100 MG capsule Commonly known as: SYMMETREL Take 100 mg by mouth 2 (two) times daily.   amLODipine 5 MG tablet Commonly known as: NORVASC Take 5 mg by mouth daily.   Asenapine Maleate 10 MG Subl Place 1 tablet (10 mg total) under the tongue in the morning, at noon, and at bedtime.   clonazePAM 0.5 MG tablet Commonly known as: KLONOPIN Take 0.5 mg by mouth daily as needed for anxiety.   docusate sodium 100 MG capsule Commonly known as: COLACE Take 400 mg by mouth at bedtime.   Eliquis 5 MG Tabs tablet Generic drug: apixaban Take 5 mg by mouth 2 (two) times daily.   haloperidol 10 MG tablet Commonly known as: HALDOL Take 1 tablet (10 mg total) by mouth 2 (two) times daily.   haloperidol decanoate 100 MG/ML injection Commonly known as: HALDOL DECANOATE Inject 3 mLs (300 mg total) into the muscle every 28 (twenty-eight) days.   latanoprost 0.005 % ophthalmic solution Commonly known as: XALATAN INSTILL 1 DROP INTO BOTH EYES EVERY NIGHT What changed:  how much to take how to take this when to take this Another medication with the same name was removed. Continue taking this medication, and follow the directions you see here.   loratadine 10 MG tablet Commonly known as: CLARITIN Take 10 mg by mouth daily.   Melatonin 10 MG Caps Take 10 mg by mouth at bedtime for 14 days.   metFORMIN 500 MG 24 hr tablet Commonly known as: GLUCOPHAGE-XR Take 500 mg by mouth 2 (two) times daily with a meal.   multivitamin with minerals Tabs tablet Take 1 tablet by mouth daily.       ASK your  doctor about these medications    ocrelizumab 300 mg in sodium chloride 0.9 % 500 mL Inject 300 mg into the vein every 6 (six) months. CHANGE ::  Ocrevus 600mg  IV EVERY 8 (EIGHT) months.   topiramate 25 MG tablet Commonly known as: TOPAMAX TAKE 1 TABLET (25 MG TOTAL) BY MOUTH TWO (TWO) TIMES DAILY.        Follow-up Information     [facility]. Schedule an appointment as soon as possible for a visit.   Contact information: [facility] Located in: [facility] Address: [address], [place], [place] [address] Phone: [phone]        Maudie Flakes, FNP Follow up in 1 week(s).   Specialty: Family Medicine Contact information: [address] [place] [address] [phone]         York Spaniel, MD Follow up in 2 week(s).   Specialty: Neurology Contact information: [address] [place] [address] [phone]  No Known Allergies  Consultations: Neurology, psychiatry   Procedures/Studies: MR BRAIN WO CONTRAST  Result Date: 01/14/2021 CLINICAL DATA:  Multiple sclerosis, new event. EXAM: MRI HEAD WITHOUT CONTRAST TECHNIQUE: Multiplanar, multiecho pulse sequences of the brain and surrounding structures were obtained without intravenous contrast. COMPARISON:  Same day cervical spine MRI 01/14/2021. Prior brain MRI examinations 01/14/2021 and earlier. FINDINGS: Brain: The examination is intermittently motion degraded, limiting evaluation. Most notably, there is moderate/severe motion degradation of the axial T2 sequence, severe motion degradation of the axial SWI sequence, severe motion degradation of the axial T1 weighted sequence and severe motion degradation of the coronal T2 sequence. Age advanced cerebral and cerebellar atrophy. Again demonstrated is advanced multifocal T2/FLAIR hyperintense signal abnormality within the subcortical/juxtacortical and deep  periventricular white matter, as well as corpus callosum. No definitively new lesion is identified as compared to the prior brain MRI of 09/13/2020. No definite posterior fossa lesions are identified. There is no acute infarct. Within described limitations, no intracranial mass, chronic intracranial blood products or extra-axial fluid collection is identified. No midline shift. Vascular: Expected proximal arterial flow voids. Skull and upper cervical spine: No focal marrow lesion. Sinuses/Orbits: Visualized orbits show no acute finding. Small right maxillary sinus mucous retention cyst at the imaged levels. IMPRESSION: Motion degraded examination, as described and limiting evaluation. Redemonstrated severe supratentorial white matter disease, not appreciably changed from the brain MRI of 09/13/2020. Findings are compatible with the provided history of multiple sclerosis. Age-advanced generalized cerebral and cerebellar atrophy. Electronically Signed   By: Jackey Loge DO   On: 01/14/2021 15:52   MR BRAIN WO CONTRAST  Result Date: 01/14/2021 CLINICAL DATA:  Decreased ambulation, history of multiple sclerosis EXAM: MRI HEAD WITHOUT CONTRAST TECHNIQUE: Multiplanar, multiecho pulse sequences of the brain and surrounding structures were obtained without intravenous contrast. COMPARISON:  09/13/2020 FINDINGS: Significant motion artifact is present. Brain: Patchy and confluent T2 hyperintensity in the supratentorial white matter is nonspecific but probably reflects reported multiple sclerosis. Burden is grossly unchanged. There is ex vacuo dilatation with prominence of ventricles and sulci greater than expected for age. There is unchanged thinning of the corpus callosum. There is no acute infarction or intracranial hemorrhage. There is no intracranial mass, mass effect, or edema. There is no hydrocephalus or extra-axial fluid collection. Vascular: Major vessel flow voids at the skull base are preserved. Skull and upper  cervical spine: Normal marrow signal is preserved. Sinuses/Orbits: Paranasal sinuses are aerated. Orbits are unremarkable. Other: Sella is unremarkable.  Mastoid air cells are clear. IMPRESSION: Significantly motion degraded. Similar burden of chronic demyelination with suboptimal evaluation for new lesions. Electronically Signed   By: Guadlupe Spanish M.D.   On: 01/14/2021 10:12   MR BRAIN W WO CONTRAST  Result Date: 01/16/2021 CLINICAL DATA:  Multiple sclerosis.  Follow-up. EXAM: MRI HEAD WITHOUT AND WITH CONTRAST TECHNIQUE: Multiplanar, multiecho pulse sequences of the brain and surrounding structures were obtained without and with intravenous contrast. CONTRAST:  77mL GADAVIST GADOBUTROL 1 MMOL/ML IV SOLN COMPARISON:  01/14/2021 FINDINGS: Brain: Diffusion imaging does not show any acute or subacute infarction or other cause of restricted diffusion. No focal abnormality is seen affecting the brainstem or cerebellum. Cerebral hemispheres show patchy and confluent abnormal T2 and FLAIR signal within the white matter consistent with chronic demyelinating disease. No pressure will change since previous studies. No lesions show restricted diffusion or contrast enhancement. Vascular: Major vessels at the base of the brain show flow. Skull and upper cervical spine: Negative Sinuses/Orbits: Clear/normal Other: None  IMPRESSION: Motion degraded study. No change since previous examinations. Patchy and confluent chronic abnormal T2 and FLAIR signal throughout the cerebral hemispheric white matter consistent with chronic demyelinating disease. No evidence of progressive lesions. No lesions show restricted diffusion or contrast enhancement. Electronically Signed   By: Paulina Fusi M.D.   On: 01/16/2021 12:56   MR CERVICAL SPINE WO CONTRAST  Result Date: 01/14/2021 CLINICAL DATA:  Decreased ambulation. EXAM: MRI CERVICAL SPINE WITHOUT CONTRAST TECHNIQUE: Multiplanar, multisequence MR imaging of the cervical spine was  performed. No intravenous contrast was administered. COMPARISON:  09/13/2020 FINDINGS: Alignment: Generalized straightening of the cervical spine Vertebrae: No fracture, evidence of discitis, or bone lesion. Cord: T2 hyperintensity is seen at the level of the C4 and C5 cord on STIR imaging. The extent is similar to a January 2022 comparison. No superimposed swelling is seen. Posterior Fossa, vertebral arteries, paraspinal tissues: Posterior fossa is reported separately. Disc levels: Negative for degenerative changes. IMPRESSION: 1. Severe motion degradation, axial imaging is nondiagnostic. 2. Chronic demyelination at C4 and C5. Electronically Signed   By: Marnee Spring M.D.   On: 01/14/2021 10:40   MR CERVICAL SPINE W WO CONTRAST  Addendum Date: 01/16/2021   ADDENDUM REPORT: 01/16/2021 12:56 ADDENDUM: After contrast administration, no abnormal enhancement occurs. There is some motion artifact but no true enhancement. Electronically Signed   By: Paulina Fusi M.D.   On: 01/16/2021 12:56   Result Date: 01/16/2021 CLINICAL DATA:  Multiple sclerosis.  Unsteady gait and weakness. EXAM: MRI CERVICAL SPINE WITHOUT AND WITH CONTRAST TECHNIQUE: Multiplanar and multiecho pulse sequences of the cervical spine, to include the craniocervical junction and cervicothoracic junction, were obtained without and with intravenous contrast. CONTRAST:  25mL GADAVIST GADOBUTROL 1 MMOL/ML IV SOLN COMPARISON:  01/14/2021.  09/13/2020.  07/17/2020. FINDINGS: Alignment: Normal Vertebrae: Normal Cord: No change since previous examinations. There is chronic abnormal T2 signal within the spinal cord in the region from upper C4 to upper C6. No evidence of cord atrophy. No other focal involvement is identified. Posterior Fossa, vertebral arteries, paraspinal tissues: See results of brain exam. Disc levels: No disc level pathology.  No stenosis of the canal or foramina. IMPRESSION: Stable examination. Chronic abnormal T2 signal within the  cervical cord from C4-C6 as outlined above. Electronically Signed: By: Paulina Fusi M.D. On: 01/16/2021 12:49   MR THORACIC SPINE W WO CONTRAST  Result Date: 01/16/2021 CLINICAL DATA:  Ataxia. EXAM: MRI THORACIC WITHOUT AND WITH CONTRAST TECHNIQUE: Multiplanar and multiecho pulse sequences of the thoracic spine were obtained without and with intravenous contrast. CONTRAST:  37mL GADAVIST GADOBUTROL 1 MMOL/ML IV SOLN COMPARISON:  07/17/2020 FINDINGS: Alignment:  No malalignment. Vertebrae: No vertebral body abnormality. Cord: No cord lesion. No evidence of demyelinating disease in the thoracic region. No abnormal cord enhancement. Paraspinal and other soft tissues: Negative Disc levels: Disc levels are normal except for a shallow disc herniation at T7-8 with slight upward migration but no apparent compressive narrowing of the canal or foramina. IMPRESSION: Motion degraded study.  No thoracic cord abnormality is identified. Chronic shallow disc herniation at T7-8 with upward migration. This does not likely cause significant neural compression. Electronically Signed   By: Paulina Fusi M.D.   On: 01/16/2021 12:53      Subjective: Sleepy this AM.  He was okay with me examining him.  No overnight issues  Discharge Exam: Vitals:   01/17/21 0505 01/17/21 1114  BP: 128/86 (!) 138/91  Pulse: 71 77  Resp: 20 17  Temp:  98.4 F (36.9 C) 98.6 F (37 C)  SpO2: 100% 99%   Vitals:   01/16/21 1312 01/16/21 2001 01/17/21 0505 01/17/21 1114  BP: (!) 147/98 127/90 128/86 (!) 138/91  Pulse: 79 82 71 77  Resp: 17 20 20 17   Temp: 98.4 F (36.9 C) 98.6 F (37 C) 98.4 F (36.9 C) 98.6 F (37 C)  TempSrc: Oral Oral Axillary Oral  SpO2: 100% 100% 100% 99%  Weight:      Height:        General: NAD, calm mildly sleepy Cardiovascular: RRR, S1/S2 +, no rubs, no gallops Respiratory: CTA bilaterally, no wheezing, no rhonchi Abdominal: Soft, NT, ND, bowel sounds + Extremities: no edema,    The results of  significant diagnostics from this hospitalization (including imaging, microbiology, ancillary and laboratory) are listed below for reference.     Microbiology: Recent Results (from the past 240 hour(s))  Resp Panel by RT-PCR (Flu A&B, Covid) Nasopharyngeal Swab     Status: None   Collection Time: 01/14/21  7:41 PM   Specimen: Nasopharyngeal Swab; Nasopharyngeal(NP) swabs in vial transport medium  Result Value Ref Range Status   SARS Coronavirus 2 by RT PCR NEGATIVE NEGATIVE Final    Comment: (NOTE) SARS-CoV-2 target nucleic acids are NOT DETECTED.  The SARS-CoV-2 RNA is generally detectable in upper respiratory specimens during the acute phase of infection. The lowest concentration of SARS-CoV-2 viral copies this assay can detect is 138 copies/mL. A negative result does not preclude SARS-Cov-2 infection and should not be used as the sole basis for treatment or other patient management decisions. A negative result may occur with  improper specimen collection/handling, submission of specimen other than nasopharyngeal swab, presence of viral mutation(s) within the areas targeted by this assay, and inadequate number of viral copies(<138 copies/mL). A negative result must be combined with clinical observations, patient history, and epidemiological information. The expected result is Negative.  Fact Sheet for Patients:  03/17/21  Fact Sheet for Healthcare Providers:  BloggerCourse.com  This test is no t yet approved or cleared by the [place] FDA and  has been authorized for detection and/or diagnosis of SARS-CoV-2 by FDA under an Emergency Use Authorization (EUA). This EUA will remain  in effect (meaning this test can be used) for the duration of the COVID-19 declaration under Section 564(b)(1) of the Act, 21 U.S.C.section 360bbb-3(b)(1), unless the authorization is terminated  or revoked sooner.       Influenza A by  PCR NEGATIVE NEGATIVE Final   Influenza B by PCR NEGATIVE NEGATIVE Final    Comment: (NOTE) The Xpert Xpress SARS-CoV-2/FLU/RSV plus assay is intended as an aid in the diagnosis of influenza from Nasopharyngeal swab specimens and should not be used as a sole basis for treatment. Nasal washings and aspirates are unacceptable for Xpert Xpress SARS-CoV-2/FLU/RSV testing.  Fact Sheet for Patients: [place]  Fact Sheet for Healthcare Providers: BloggerCourse.com  This test is not yet approved or cleared by the [place] FDA and has been authorized for detection and/or diagnosis of SARS-CoV-2 by FDA under an Emergency Use Authorization (EUA). This EUA will remain in effect (meaning this test can be used) for the duration of the COVID-19 declaration under Section 564(b)(1) of the Act, 21 U.S.C. section 360bbb-3(b)(1), unless the authorization is terminated or revoked.  Performed at [facility], [address] [place], [place]      Labs: BNP (last 3 results) No results for input(s): BNP in the last 8760 hours.  Basic Metabolic Panel: Recent Labs  Lab 01/14/21 0123 01/16/21 0428  NA 138 138  K 4.0 3.6  CL 110 108  CO2 20* 21*  GLUCOSE 103* 126*  BUN 14 6  CREATININE 1.07 0.82  CALCIUM 9.2 9.5   Liver Function Tests: Recent Labs  Lab 01/14/21 0123 01/16/21 0428  AST 17 16  ALT 36 44  ALKPHOS 61 76  BILITOT 0.6 0.7  PROT 6.0* 6.7  ALBUMIN 4.0 4.2   No results for input(s): LIPASE, AMYLASE in the last 168 hours. No results for input(s): AMMONIA in the last 168 hours. CBC: Recent Labs  Lab 01/14/21 0123  WBC 7.2  NEUTROABS 4.1  HGB 13.6  HCT 42.9  MCV 82.7  PLT 197   Cardiac Enzymes: No results for input(s): CKTOTAL, CKMB, CKMBINDEX, TROPONINI in the last 168 hours. BNP: Invalid input(s): POCBNP CBG: Recent Labs  Lab 01/16/21 0828 01/16/21 1243  GLUCAP 138* 120*    D-Dimer No results for input(s): DDIMER in the last 72 hours. Hgb A1c No results for input(s): HGBA1C in the last 72 hours. Lipid Profile No results for input(s): CHOL, HDL, LDLCALC, TRIG, CHOLHDL, LDLDIRECT in the last 72 hours. Thyroid function studies Recent Labs    01/15/21 0303  TSH 1.424   Anemia work up Recent Labs    01/15/21 0303  VITAMINB12 738   Urinalysis    Component Value Date/Time   COLORURINE YELLOW 09/13/2020 1656   APPEARANCEUR HAZY (A) 09/13/2020 1656   LABSPEC 1.017 09/13/2020 1656   PHURINE 7.0 09/13/2020 1656   GLUCOSEU NEGATIVE 09/13/2020 1656   HGBUR NEGATIVE 09/13/2020 1656   HGBUR negative 01/14/2010 1451   BILIRUBINUR NEGATIVE 09/13/2020 1656   KETONESUR NEGATIVE 09/13/2020 1656   PROTEINUR NEGATIVE 09/13/2020 1656   UROBILINOGEN 1.0 03/13/2015 0223   NITRITE NEGATIVE 09/13/2020 1656   LEUKOCYTESUR LARGE (A) 09/13/2020 1656   Sepsis Labs Invalid input(s): PROCALCITONIN,  WBC,  LACTICIDVEN Microbiology Recent Results (from the past 240 hour(s))  Resp Panel by RT-PCR (Flu A&B, Covid) Nasopharyngeal Swab     Status: None   Collection Time: 01/14/21  7:41 PM   Specimen: Nasopharyngeal Swab; Nasopharyngeal(NP) swabs in vial transport medium  Result Value Ref Range Status   SARS Coronavirus 2 by RT PCR NEGATIVE NEGATIVE Final    Comment: (NOTE) SARS-CoV-2 target nucleic acids are NOT DETECTED.  The SARS-CoV-2 RNA is generally detectable in upper respiratory specimens during the acute phase of infection. The lowest concentration of SARS-CoV-2 viral copies this assay can detect is 138 copies/mL. A negative result does not preclude SARS-Cov-2 infection and should not be used as the sole basis for treatment or other patient management decisions. A negative result may occur with  improper specimen collection/handling, submission of specimen other than nasopharyngeal swab, presence of viral mutation(s) within the areas targeted by this assay,  and inadequate number of viral copies(<138 copies/mL). A negative result must be combined with clinical observations, patient history, and epidemiological information. The expected result is Negative.  Fact Sheet for Patients:  BloggerCourse.com  Fact Sheet for Healthcare Providers:  [employer]iousBroker.it  This test is no t yet approved or cleared by the [place] FDA and  has been authorized for detection and/or diagnosis of SARS-CoV-2 by FDA under an Emergency Use Authorization (EUA). This EUA will remain  in effect (meaning this test can be used) for the duration of the COVID-19 declaration under Section 564(b)(1) of the Act, 21 U.S.C.section 360bbb-3(b)(1), unless the authorization is terminated  or  revoked sooner.       Influenza A by PCR NEGATIVE NEGATIVE Final   Influenza B by PCR NEGATIVE NEGATIVE Final    Comment: (NOTE) The Xpert Xpress SARS-CoV-2/FLU/RSV plus assay is intended as an aid in the diagnosis of influenza from Nasopharyngeal swab specimens and should not be used as a sole basis for treatment. Nasal washings and aspirates are unacceptable for Xpert Xpress SARS-CoV-2/FLU/RSV testing.  Fact Sheet for Patients: BloggerCourse.com  Fact Sheet for Healthcare Providers: SeriousBroker.it  This test is not yet approved or cleared by the [place] FDA and has been authorized for detection and/or diagnosis of SARS-CoV-2 by FDA under an Emergency Use Authorization (EUA). This EUA will remain in effect (meaning this test can be used) for the duration of the COVID-19 declaration under Section 564(b)(1) of the Act, 21 U.S.C. section 360bbb-3(b)(1), unless the authorization is terminated or revoked.  Performed at [facility], [address] [place], [place] [address]      Time coordinating discharge: Over 30 minutes  SIGNED:   Lynn Ito, MD  [facility] 01/17/2021, 12:10 PM Pager   If 7PM-7AM, please contact night-coverage [email or web address] Password [email or web address]

## 2021-01-17 NOTE — TOC Progression Note (Signed)
Transition of Care [facility]) - Progression Note    Patient Details  Name: Brandon [name] MRN: [id number] Date of Birth: [birth year]  Transition of Care [facility]) CM/SW Contact  Ralene Bathe, LCSWA Phone Number: 01/17/2021, 1:48 PM  Clinical Narrative:    13:48-  CSW received notification from RN that the patient's mother called and reported that the patient could return home since he is medically ready to d/c.  CSW called the mother and verified this information and was informed that the patient's cousin would be home when the patient arrived.  [facility] was called to transport the patient home.    CSW will sign off for now as social work intervention is no longer needed. Please consult [place] again if new needs arise.      Expected Discharge Plan and Services           Expected Discharge Date: 01/17/21                                     Social Determinants of Health (SDOH) Interventions    Readmission Risk Interventions No flowsheet data found.

## 2021-01-17 NOTE — Progress Notes (Signed)
Discharge orders received. Printed AVS forms for patient and notified mother of patient discharge home. PTAR notified by SW to d/c patient. Awaiting PTAR at this time.

## 2021-01-17 NOTE — Consult Note (Addendum)
[facility] Face-to-Face Psychiatry Consult   Reason for Consult:  Schizophrenia Referring Physician:  Orland Mustard, MD Patient Identification: Brandon [name] MRN:  [id number] Principal Diagnosis: Unsteady gait Diagnosis:  Principal Problem:   Unsteady gait Active Problems:   HTN (hypertension)   Multiple sclerosis (HCC)   Disorganized schizophrenia (HCC)   Pulmonary embolism (HCC)   Total Time spent with patient: 15 minutes  Subjective:   Brandon [name] is a 30 y.o. male patient medical history significant of MS followed by neurology on Ocrevus, HTN, PE and schizophrenia who presented to ED from [facility] for medical clearance due to unsteady gait and weakness in bilateral lower extremities. He was having increasing difficulty ambulating.  He was at behavorial health as he was becoming more aggressive toward his mother and the voices were telling him to beat her and bloody her in the face day prior to admission.  HPI:  RN reports patient has been doing very well and is not requiring PRNs. No reports of responding to internal stimuli. RN also notes that patient is a bit easier to understand than at admission (she has taken care of patient last 3 days). Patient is oriented and denies SI, HI, and AVH. Patient is able to provide the same story he provided about why he as admitted. Patient reports that he feels better no longer hearing the voices. Past Psychiatric History: ADHD, Schizophrenia. Was seeing Dr. Evelene Croon until 09/24/2020. Now has a psychiatrist through his day program and received his medications through them as well. Hx of Past meds tried- Risperidone, Invega, Abilify, Olanzapine, Clozapine (in 2019), Seroquel, Doxepin, Gabapentin, Klonopin and Trazodone.  Risk to Self:  NO Risk to Others:  NO Prior Inpatient Therapy: NO  Prior Outpatient Therapy:  YES  Past Medical History:  Past Medical History:  Diagnosis Date   ADHD (attention deficit hyperactivity disorder)    Bipolar 1 disorder  (HCC)    Chronic back pain    Chronic constipation    Chronic neck pain    Hypertension    Multiple sclerosis (HCC) 05/20/2013   left sided weakness, dysarthria   Non-compliance    Obesity    Pulmonary embolism (HCC)    Schizophrenia (HCC)    Stroke (HCC)    left sided deficits - pt's mother denies this    Past Surgical History:  Procedure Laterality Date   NO PAST SURGERIES     None     RADIOLOGY WITH ANESTHESIA N/A 01/16/2021   Procedure: MRI WITH ANESTHESIA CERVICAL AND THORASIC SPINE WITH AND WITHOUT CONTRAST;  Surgeon: Radiologist, Medication, MD;  Location: [facility] OR;  Service: Radiology;  Laterality: N/A;   TOOTH EXTRACTION N/A 06/24/2019   Procedure: DENTAL RESTORATION/EXTRACTION OF TEETH NUMBER ONE, SIXTEEN, SEVENTEEN, NINETEEN, THIRTY-TWO;  Surgeon: Ocie Doyne, DDS;  Location: [facility] OR;  Service: Oral Surgery;  Laterality: N/A;   Family History:  Family History  Problem Relation Age of Onset   Diabetes Mother    ADD / ADHD Brother    Family Psychiatric  History: Per mother numerous paternal members have required medication for mental health. Father: PTSD, depression, schizophrenia? Brother: unknown dx but mom reports he has hallucinations as well Aunt: Schizophrenia Maternal niece: MS Paternal Aunt: MS Social History:  Social History   Substance and Sexual Activity  Alcohol Use Not Currently   Alcohol/week: 0.0 standard drinks   Comment: "A little bit"      Social History   Substance and Sexual Activity  Drug Use Not Currently  Types: Marijuana   Comment: Last used: unknown     Social History   Socioeconomic History   Marital status: Single    Spouse name: Not on file   Number of children: 0   Years of education: 11th   Highest education level: Not on file  Occupational History   Occupation: unemployed    Employer: [employer]    Comment: Disbaled  Tobacco Use   Smoking status: Every Day    Packs/day: 0.25    Types: Cigarettes   Smokeless  tobacco: Never   Tobacco comments:    2 cigarettes a day  Vaping Use   Vaping Use: Never used  Substance and Sexual Activity   Alcohol use: Not Currently    Alcohol/week: 0.0 standard drinks    Comment: "A little bit"    Drug use: Not Currently    Types: Marijuana    Comment: Last used: unknown    Sexual activity: Not on file  Other Topics Concern   Not on file  Social History Narrative   Patient lives at home with his mother.   Disabled.   Education 11 th grade .   Right handed.   Drinks caffeine occassionally   Social Determinants of Corporate investment banker Strain: Not on file  Food Insecurity: Not on file  Transportation Needs: Not on file  Physical Activity: Not on file  Stress: Not on file  Social Connections: Not on file   Additional Social History:    Allergies:  No Known Allergies  Labs:  Results for orders placed or performed during the hospital encounter of 01/14/21 (from the past 48 hour(s))  Comprehensive metabolic panel     Status: Abnormal   Collection Time: 01/16/21  4:28 AM  Result Value Ref Range   Sodium 138 135 - 145 mmol/L   Potassium 3.6 3.5 - 5.1 mmol/L   Chloride 108 98 - 111 mmol/L   CO2 21 (L) 22 - 32 mmol/L   Glucose, Bld 126 (H) 70 - 99 mg/dL    Comment: Glucose reference range applies only to samples taken after fasting for at least 8 hours.   BUN 6 6 - 20 mg/dL   Creatinine, Ser 4.00 0.61 - 1.24 mg/dL   Calcium 9.5 8.9 - 86.7 mg/dL   Total Protein 6.7 6.5 - 8.1 g/dL   Albumin 4.2 3.5 - 5.0 g/dL   AST 16 15 - 41 U/L   ALT 44 0 - 44 U/L   Alkaline Phosphatase 76 38 - 126 U/L   Total Bilirubin 0.7 0.3 - 1.2 mg/dL   GFR, Estimated >61 >95 mL/min    Comment: (NOTE) Calculated using the CKD-EPI Creatinine Equation (2021)    Anion gap 9 5 - 15    Comment: Performed at [facility], [address], [place], [place] [address]  Glucose, capillary     Status: Abnormal   Collection Time: 01/16/21  8:28 AM  Result Value Ref  Range   Glucose-Capillary 138 (H) 70 - 99 mg/dL    Comment: Glucose reference range applies only to samples taken after fasting for at least 8 hours.  Glucose, capillary     Status: Abnormal   Collection Time: 01/16/21 12:43 PM  Result Value Ref Range   Glucose-Capillary 120 (H) 70 - 99 mg/dL    Comment: Glucose reference range applies only to samples taken after fasting for at least 8 hours.    Current Facility-Administered Medications  Medication Dose Route  Frequency Provider Last Rate Last Admin   0.9 %  sodium chloride infusion  250 mL Intravenous PRN Orland Mustard, MD       amantadine (SYMMETREL) capsule 100 mg  100 mg Oral BID Eliseo Gum B, MD   100 mg at 01/17/21 0950   amLODipine (NORVASC) tablet 5 mg  5 mg Oral Daily Orland Mustard, MD   5 mg at 01/17/21 0950   apixaban (ELIQUIS) tablet 5 mg  5 mg Oral BID Orland Mustard, MD   5 mg at 01/17/21 0950   asenapine (SAPHRIS) sublingual tablet 10 mg  10 mg Sublingual TID AC Eliseo Gum B, MD   10 mg at 01/17/21 0759   diphenhydrAMINE (BENADRYL) capsule 50 mg  50 mg Oral Q6H PRN Rhetta Mura, MD   50 mg at 01/15/21 2116   docusate sodium (COLACE) capsule 400 mg  400 mg Oral QHS Orland Mustard, MD   400 mg at 01/16/21 2133   haloperidol (HALDOL) tablet 10 mg  10 mg Oral BID Eliseo Gum B, MD   10 mg at 01/17/21 0950   latanoprost (XALATAN) 0.005 % ophthalmic solution 1 drop  1 drop Both Eyes QHS Orland Mustard, MD   1 drop at 01/16/21 2130   melatonin tablet 10 mg  10 mg Oral QHS Rhetta Mura, MD   10 mg at 01/16/21 2132   metFORMIN (GLUCOPHAGE-XR) 24 hr tablet 500 mg  500 mg Oral BID WC Rhetta Mura, MD   500 mg at 01/17/21 0759   sodium chloride flush (NS) 0.9 % injection 3 mL  3 mL Intravenous Q12H Orland Mustard, MD   3 mL at 01/16/21 2138   sodium chloride flush (NS) 0.9 % injection 3 mL  3 mL Intravenous PRN Orland Mustard, MD       ziprasidone (GEODON) injection 10 mg  10 mg Intramuscular Q2H PRN  Rhetta Mura, MD        Musculoskeletal: Strength & Muscle Tone:  per hospitalist note Gait & Station:  remains in bed on exam Patient leans: N/A            Psychiatric Specialty Exam:  Presentation  General Appearance: Appropriate for Environment  Eye Contact:Fair  Speech:Slurred; Slow  Speech Volume:Decreased  Handedness:Right   Mood and Affect  Mood:Euthymic  Affect:Blunt   Thought Process  Thought Processes:Goal Directed  Descriptions of Associations:Circumstantial  Orientation:Full (Time, Place and Person)  Thought Content:Logical  History of Schizophrenia/Schizoaffective disorder:Yes  Duration of Psychotic Symptoms:Greater than six months  Hallucinations:Hallucinations: None  Ideas of Reference:None  Suicidal Thoughts:Suicidal Thoughts: No  Homicidal Thoughts:Homicidal Thoughts: No   Sensorium  Memory:Immediate Fair; Recent Fair; Remote Fair  Judgment:-- (Improved)  Insight:Shallow   Executive Functions  Concentration:Fair  Attention Span:Fair  Recall:Good  Fund of Knowledge:Poor  Language:Poor   Psychomotor Activity  Psychomotor Activity:Psychomotor Activity: Psychomotor Retardation   Assets  Assets:Desire for Improvement; Resilience   Sleep  Sleep:Sleep: Good   Physical Exam: Physical Exam HENT:     Head: Normocephalic and atraumatic.  Eyes:     Extraocular Movements: Extraocular movements intact.     Conjunctiva/sclera: Conjunctivae normal.  Cardiovascular:     Rate and Rhythm: Normal rate.  Pulmonary:     Effort: Pulmonary effort is normal.     Breath sounds: Normal breath sounds.  Abdominal:     General: Abdomen is flat.  Musculoskeletal:     Comments: Psychomotor retardation  Skin:    General: Skin is warm and dry.  Neurological:  Mental Status: He is alert and oriented to person, place, and time.   Review of Systems  Constitutional:  Negative for chills and fever.  HENT:   Negative for hearing loss.   Eyes:  Negative for blurred vision.  Respiratory:  Negative for cough and wheezing.   Cardiovascular:  Negative for chest pain.  Gastrointestinal:  Negative for abdominal pain.  Neurological:  Negative for dizziness.  Psychiatric/Behavioral:  Negative for hallucinations and suicidal ideas.   Blood pressure (!) 138/91, pulse 77, temperature 98.6 F (37 C), temperature source Oral, resp. rate 17, height 6\' 1"  (1.854 m), weight (!) 148.3 kg, SpO2 99 %. Body mass index is 43.13 kg/m.  Treatment Plan Summary: Medication management Schizophrenia, disorganized Multiple sclerosis, relapse remitting type Patient was cleared of possible relapse/ worsening of MS by neurology. Patient appears to be responding well to psychiatric medication adjustments as his behavior appears to have returned to baseline and patient has not been seen responding to internal stimuli. Patient overall appears at his baseline an no longer meets criteria for need for inpatient stabilization at psych hospital. This patient requires 2 SGAs due to his hx of refractory to one antipsychotic agent despite attempting with at least 3 antipsychotics in the past. Confirmed with patient's mother she is ok with the regimen. - Continue Haldol 10mg  BID, some patient's require PO with LAI for stabilization - Continue haldol deconaoate OP - Continue Saphris 10mg  TID - Continue Symmetrel 100mg  BID  Will sign off. Thank you for this consult.  Disposition:  Per primary team.  PGY-2 , MD 01/17/2021 11:23 AM

## 2021-03-01 ENCOUNTER — Other Ambulatory Visit (HOSPITAL_COMMUNITY): Payer: Self-pay

## 2021-03-04 ENCOUNTER — Encounter (HOSPITAL_COMMUNITY): Payer: Self-pay | Admitting: Registered Nurse

## 2021-03-04 ENCOUNTER — Ambulatory Visit (INDEPENDENT_AMBULATORY_CARE_PROVIDER_SITE_OTHER)
Admission: EM | Admit: 2021-03-04 | Discharge: 2021-03-04 | Disposition: A | Discharge status: Other Acute Inpatient Hospital | Payer: Medicare Other | Source: Home / Self Care

## 2021-03-04 ENCOUNTER — Other Ambulatory Visit: Payer: Self-pay

## 2021-03-04 ENCOUNTER — Emergency Department (HOSPITAL_COMMUNITY)
Admission: EM | Admit: 2021-03-04 | Discharge: 2021-03-05 | Disposition: A | Discharge status: Nursing Home (Includes ICF) | Payer: Medicare Other | Source: Home / Self Care | Attending: Emergency Medicine | Admitting: Emergency Medicine

## 2021-03-04 ENCOUNTER — Telehealth: Payer: Self-pay | Admitting: Neurology

## 2021-03-04 ENCOUNTER — Encounter (HOSPITAL_COMMUNITY): Payer: Self-pay

## 2021-03-04 DIAGNOSIS — R440 Auditory hallucinations: Secondary | ICD-10-CM | POA: Diagnosis not present

## 2021-03-04 DIAGNOSIS — Z79899 Other long term (current) drug therapy: Secondary | ICD-10-CM | POA: Insufficient documentation

## 2021-03-04 DIAGNOSIS — Z8616 Personal history of COVID-19: Secondary | ICD-10-CM | POA: Insufficient documentation

## 2021-03-04 DIAGNOSIS — Z046 Encounter for general psychiatric examination, requested by authority: Secondary | ICD-10-CM | POA: Insufficient documentation

## 2021-03-04 DIAGNOSIS — F17210 Nicotine dependence, cigarettes, uncomplicated: Secondary | ICD-10-CM | POA: Insufficient documentation

## 2021-03-04 DIAGNOSIS — F209 Schizophrenia, unspecified: Secondary | ICD-10-CM

## 2021-03-04 DIAGNOSIS — I10 Essential (primary) hypertension: Secondary | ICD-10-CM | POA: Insufficient documentation

## 2021-03-04 DIAGNOSIS — F201 Disorganized schizophrenia: Secondary | ICD-10-CM | POA: Diagnosis present

## 2021-03-04 DIAGNOSIS — Z20822 Contact with and (suspected) exposure to covid-19: Secondary | ICD-10-CM | POA: Insufficient documentation

## 2021-03-04 DIAGNOSIS — Z7901 Long term (current) use of anticoagulants: Secondary | ICD-10-CM | POA: Insufficient documentation

## 2021-03-04 DIAGNOSIS — F23 Brief psychotic disorder: Secondary | ICD-10-CM | POA: Diagnosis not present

## 2021-03-04 LAB — CBC WITH DIFFERENTIAL/PLATELET
Abs Immature Granulocytes: 0.1 10*3/uL — ABNORMAL HIGH (ref 0.00–0.07)
Basophils Absolute: 0.1 10*3/uL (ref 0.0–0.1)
Basophils Relative: 1 %
Eosinophils Absolute: 0.3 10*3/uL (ref 0.0–0.5)
Eosinophils Relative: 3 %
HCT: 43.4 % (ref 39.0–52.0)
Hemoglobin: 13.3 g/dL (ref 13.0–17.0)
Immature Granulocytes: 1 %
Lymphocytes Relative: 27 %
Lymphs Abs: 2.5 10*3/uL (ref 0.7–4.0)
MCH: 25.7 pg — ABNORMAL LOW (ref 26.0–34.0)
MCHC: 30.6 g/dL (ref 30.0–36.0)
MCV: 83.9 fL (ref 80.0–100.0)
Monocytes Absolute: 0.7 10*3/uL (ref 0.1–1.0)
Monocytes Relative: 8 %
Neutro Abs: 5.6 10*3/uL (ref 1.7–7.7)
Neutrophils Relative %: 60 %
Platelets: 210 10*3/uL (ref 150–400)
RBC: 5.17 MIL/uL (ref 4.22–5.81)
RDW: 15.1 % (ref 11.5–15.5)
WBC: 9.3 10*3/uL (ref 4.0–10.5)
nRBC: 0 % (ref 0.0–0.2)

## 2021-03-04 LAB — COMPREHENSIVE METABOLIC PANEL
ALT: 27 U/L (ref 0–44)
AST: 14 U/L — ABNORMAL LOW (ref 15–41)
Albumin: 4.1 g/dL (ref 3.5–5.0)
Alkaline Phosphatase: 78 U/L (ref 38–126)
Anion gap: 7 (ref 5–15)
BUN: 14 mg/dL (ref 6–20)
CO2: 24 mmol/L (ref 22–32)
Calcium: 9.1 mg/dL (ref 8.9–10.3)
Chloride: 107 mmol/L (ref 98–111)
Creatinine, Ser: 1.11 mg/dL (ref 0.61–1.24)
GFR, Estimated: >60 mL/min (ref >60)
Glucose, Bld: 95 mg/dL (ref 70–99)
Potassium: 3.8 mmol/L (ref 3.5–5.1)
Sodium: 138 mmol/L (ref 135–145)
Total Bilirubin: 0.6 mg/dL (ref 0.3–1.2)
Total Protein: 6.6 g/dL (ref 6.5–8.1)

## 2021-03-04 LAB — RAPID URINE DRUG SCREEN, HOSP PERFORMED
Amphetamines: NOT DETECTED
Barbiturates: NOT DETECTED
Benzodiazepines: NOT DETECTED
Cocaine: NOT DETECTED
Opiates: NOT DETECTED
Tetrahydrocannabinol: NOT DETECTED

## 2021-03-04 LAB — ETHANOL: Alcohol, Ethyl (B): <10 mg/dL (ref <10)

## 2021-03-04 NOTE — ED Notes (Signed)
Pending IVC for transfer to [facility].

## 2021-03-04 NOTE — Progress Notes (Signed)
Patient given sandwich and juice per provider request.

## 2021-03-04 NOTE — Progress Notes (Signed)
Patient given graham crackers and juice.

## 2021-03-04 NOTE — ED Provider Notes (Signed)
Emergency Medicine Provider Triage Evaluation Note  STANCIL DEISHER , a 30 y.o. male  was evaluated in triage.  Pt presents with [employer] with ivc orders. [facility] sent the patient for medical clearance  Review of Systems  Positive: No sx Negative: CP, SOB, SI/HI,   Physical Exam  BP (!) 126/107 (BP Location: Left Arm)   Pulse 86   Temp 98.6 F (37 C) (Oral)   Resp 20   Ht 6\' 1"  (1.854 m)   Wt (!) 147.9 kg   SpO2 100%   BMI 43.01 kg/m  Gen:   Awake, no distress, very pleasant  Resp:  Normal effort  MSK:   Moves extremities without difficulty  Other:    Medical Decision Making  Medically screening exam initiated at 9:12 PM.  Appropriate orders placed.  CANDY ZIEGLER was informed that the remainder of the evaluation will be completed by another provider, this initial triage assessment does not replace that evaluation, and the importance of remaining in the ED until their evaluation is complete.     [name] 03/04/21 2113    2114, MD 03/05/21 [phone]

## 2021-03-04 NOTE — ED Notes (Signed)
Charge Nurse Elliot Gurney, [facility] notified that IVC pt being transferred to facility.

## 2021-03-04 NOTE — ED Notes (Signed)
In triage 

## 2021-03-04 NOTE — Telephone Encounter (Signed)
Pt's mother, Adem Costlow (on [place]) called, what is the address where his infusion has been scheduled and the time. Dr. Anne Hahn send him to some where else. Would like a call from the nurse.

## 2021-03-04 NOTE — ED Triage Notes (Signed)
Pt sent to ED from [facility] via [employer]. Pt was IVC'd at [facility] for auditory/visual hallucinations. Pt talking to people who are not there and about an implanted device that is not present. Pt not answering most questions, does complain of L chest pain intermittently, denies shortness of breath, nausea or vomiting.

## 2021-03-04 NOTE — Telephone Encounter (Signed)
Called  mother who stated patient is due to get Ocrevus. I gave [facility] to [facility], advised she call them . She verbalized understanding, appreciation.

## 2021-03-04 NOTE — ED Notes (Signed)
[employer] Dispatch called for IVC transport to [facility].

## 2021-03-04 NOTE — ED Provider Notes (Signed)
Behavioral Health Urgent Care Medical Screening Exam  Patient Name: Brandon [name] MRN: [id number] Date of Evaluation: 03/04/21 Chief Complaint:   Diagnosis:  Final diagnoses:  Disorganized schizophrenia (HCC)  Auditory hallucination    History of Present illness: Brandon [name] is a 30 y.o. male patient presented to [facility] as a walk in via [place] police with complaints that patient thinks there are devices in his head.    Brandon [name], 30 y.o., male patient seen face to face by this provider, consulted with Dr. Earlene Plater; and chart reviewed on 03/04/21.  On evaluation Brandon [name] reports he doesn't know why he was brought here.  States he was brought in by the police for "no reason."  Patient denies suicidal/homicidal ideation.  Patient asked if he felt he had a device in his skin and he stated "yes" and pinched at his arm and then his leg stating it was in his leg.  Patient then turned his head to the side and started laughing and talking to someone that was not there.  Patient asked who he was talking to and he stated "Harvie Heck."    Patient asked who Harvie Heck was and he stated it was his father.  Asked if he could see Harvie Heck and he did not respond.  Patient reports that he lives with his mother.  Patient also reports that he has been prescribed medications that he do not take because he doesn't have schizophrenia.  Patient states that he smokes "Loud" marijuana to help with his voices and the last time he smoked was 3 or 4 weeks ago.  States when he doesn't smoke the voices come back.  Currently patient denies auditory and visual hallucinations but he is seen looking and talking to someone that is not there.  Patient is not a good historian and he was brought in by [facility] police.   During evaluation Brandon [name] is sitting up in chair in no acute distress.  He is alert/oriented  person and place; not know why he was brought here.  Currently he is calm and  cooperative; He appears to be paranoid with blunt affect.  He is speaking in a clear tone at low volume, and normal pace; with minimal eye contact.  His thought process is disorganized.  Patient denies auditory/visual hallucinations but is evident that he is currently responding to responding to internal stimuli.  Patient seen talking to and looking at someone that is not there.  He denies suicidal/self-harm/homicidal ideation, psychosis, and paranoia.     Psychiatric Specialty Exam  Presentation  General Appearance:Appropriate for Environment  Eye Contact:Fair  Speech:Clear and Coherent; Slow  Speech Volume:Decreased  Handedness:Right   Mood and Affect  Mood:Euthymic  Affect:Blunt   Thought Process  Thought Processes:Disorganized; Linear  Descriptions of Associations:Circumstantial  Orientation:Other (comment) (oriented to person and place)  Thought Content:Paranoid Ideation  Diagnosis of Schizophrenia or Schizoaffective disorder in past: Yes  Duration of Psychotic Symptoms: Greater than six months  Hallucinations:Auditory denies that he is hearing voices but seen talking to someone that is not there  Ideas of Reference:Paranoia  Suicidal Thoughts:No  Homicidal Thoughts:No   Sensorium  Memory:Immediate Poor; Recent Poor; Remote Poor  Judgment:Impaired  Insight:Lacking   Executive Functions  Concentration:Fair  Attention Span:Fair  Recall:Poor  Fund of Knowledge:Poor  Language:Fair   Psychomotor Activity  Psychomotor Activity:Normal   Assets  Assets:Desire for Improvement; Housing; Leisure Time; Social Support   Sleep  Sleep:Good  Number of hours: 10  Nutritional Assessment (For OBS and FBC admissions only) Has the patient had a weight loss or gain of 10 pounds or more in the last 3 months?: No Has the patient had a decrease in food intake/or appetite?: No Does the patient have dental problems?: No Does the patient have eating habits or  behaviors that may be indicators of an eating disorder including binging or inducing vomiting?: No Has the patient recently lost weight without trying?: No Has the patient been eating poorly because of a decreased appetite?: No Malnutrition Screening Tool Score: 0    Physical Exam: Physical Exam Vitals and nursing note reviewed. Exam conducted with a chaperone present.  Constitutional:      General: He is not in acute distress.    Appearance: Normal appearance. He is not ill-appearing.  Cardiovascular:     Rate and Rhythm: Normal rate.  Pulmonary:     Effort: Pulmonary effort is normal.  Musculoskeletal:     Cervical back: Normal range of motion.  Skin:    General: Skin is warm and dry.  Neurological:     Mental Status: He is alert and oriented to person, place, and time.  Psychiatric:        Attention and Perception: He perceives auditory hallucinations.        Mood and Affect: Affect is blunt.        Speech: Speech normal.        Behavior: Behavior is withdrawn. Behavior is cooperative.        Thought Content: Thought content is paranoid. Thought content is not delusional. Thought content does not include homicidal or suicidal ideation.        Judgment: Judgment is impulsive.   Review of Systems  Unable to perform ROS: Acuity of condition (Patient not answering most questions)  Psychiatric/Behavioral:  Substance abuse: States he smokes "Loud" (marijuana).  "It works different for me.  It makes the voices go away when I smoke.  When I don't smoke the voices come back.".        Patient stating he is not taking his medications because they are the wrong ones for him "I don't have schizophrenia."   Blood pressure 113/76, pulse 77, temperature 98.6 F (37 C), temperature source Oral, resp. rate 18, SpO2 100 %. There is no height or weight on file to calculate BMI.  Musculoskeletal: Strength & Muscle Tone: within normal limits Gait & Station: normal Patient leans: N/A   [facility]  MSE Discharge Disposition for Follow up and Recommendations: Based on my evaluation I certify that psychiatric inpatient services furnished can reasonably be expected to improve the patient's condition which I recommend transfer to an appropriate accepting facility.  Patient sent to [facility] ED for medical clearance.    Secure message sent to Dr. Jodi Mourning he was unable to speak on phone related to arrival of patient with STEMI.  Informed:  This is the patient that I wanted to speak to you about.  He is coming from [facility] currently auditory/visual hallucinations.  History of disorganized schizophrenia.  He will need medical clearance.  He is recommended for inpatient psychiatric treatment; will also send a message to [facility] for appropriate bed if no bed he will be faxed out.    Also sent secure message to [facility] nursing [facility] and social work:  Patient recommended for inpatient psychiatric treatment; thought disorder bed.  If no beds available at [facility] patient will need to be faxed to surrounding facilities for appropriate bed  for inpatient psychiatric treatment.   Patient is currently being sent to [facility] ED from [facility] for medical clear ence  Illias Pantano, NP 03/04/2021, 4:29 PM

## 2021-03-04 NOTE — ED Notes (Signed)
Patient very aggitated. This RN redirecting in attempt to calm patient

## 2021-03-04 NOTE — Progress Notes (Signed)
   03/04/21 1614  [facility] Triage Screening (Walk-ins at [facility] only)  What Is the Reason for Your Visit/Call Today? Patient present to the [facility] accompanied by [employer] (4 officers) who brought the patient here voluntary. Per report [employer] reported patient thinks a device is located in his head. Per chart review patient has history of Disorganized Schizophrenia. He was admitted inpatient [facility] to 01/2021. Patient observed talking to himself. Patient is responding to internal stimuli. Patient denied SI/HI. Patient last seen at [facility] via walk-in on 01/10/21.  How Long Has This Been Causing You Problems? > than 6 months  Have You Recently Had Any Thoughts About Hurting Yourself? No  Are You Planning to Commit Suicide/Harm Yourself At This time? No  Have you Recently Had Thoughts About Hurting Someone [name]? No  Are You Planning To Harm Someone At This Time? No  Are you currently experiencing any auditory, visual or other hallucinations? No  Have You Used Any Alcohol or Drugs in the Past 24 Hours? No  Do you have any current medical co-morbidities that require immediate attention? No  Please describe current medical co-morbidities that require immediate attention: Pt has a medical dx of MS and was medically hospitalized in March 2022  Clinician description of patient physical appearance/behavior: Patient was dressed appropriately for the weather. Patient responding to internal stimuli and observed talking to himself.  What Do You Feel Would Help You the Most Today? Stress Management  If access to [facility] Urgent Care was not available, would you have sought care in the Emergency Department? No  Determination of Need Emergent (2 hours)  Options For Referral Medication Management

## 2021-03-04 NOTE — Progress Notes (Signed)
Attempted to call report to [facility].  No answer on charge phone.

## 2021-03-05 ENCOUNTER — Other Ambulatory Visit (HOSPITAL_COMMUNITY): Payer: Self-pay

## 2021-03-05 ENCOUNTER — Inpatient Hospital Stay (HOSPITAL_COMMUNITY)
Admission: AD | Admit: 2021-03-05 | Discharge: 2021-03-28 | DRG: 885 | Disposition: A | Discharge status: Home / Self Care | Payer: Medicare Other | Source: Intra-hospital | Attending: Emergency Medicine | Admitting: Emergency Medicine

## 2021-03-05 ENCOUNTER — Other Ambulatory Visit: Payer: Self-pay

## 2021-03-05 ENCOUNTER — Other Ambulatory Visit: Payer: Self-pay | Admitting: Registered Nurse

## 2021-03-05 ENCOUNTER — Encounter (HOSPITAL_COMMUNITY): Payer: Self-pay | Admitting: Registered Nurse

## 2021-03-05 DIAGNOSIS — Z9119 Patient's noncompliance with other medical treatment and regimen: Secondary | ICD-10-CM | POA: Diagnosis not present

## 2021-03-05 DIAGNOSIS — F209 Schizophrenia, unspecified: Secondary | ICD-10-CM

## 2021-03-05 DIAGNOSIS — Z7901 Long term (current) use of anticoagulants: Secondary | ICD-10-CM

## 2021-03-05 DIAGNOSIS — Z8673 Personal history of transient ischemic attack (TIA), and cerebral infarction without residual deficits: Secondary | ICD-10-CM | POA: Diagnosis not present

## 2021-03-05 DIAGNOSIS — Z20822 Contact with and (suspected) exposure to covid-19: Secondary | ICD-10-CM | POA: Diagnosis present

## 2021-03-05 DIAGNOSIS — F23 Brief psychotic disorder: Secondary | ICD-10-CM | POA: Diagnosis present

## 2021-03-05 DIAGNOSIS — F17210 Nicotine dependence, cigarettes, uncomplicated: Secondary | ICD-10-CM | POA: Diagnosis present

## 2021-03-05 DIAGNOSIS — Z6841 Body Mass Index (BMI) 40.0 and over, adult: Secondary | ICD-10-CM

## 2021-03-05 DIAGNOSIS — E66813 Obesity, class 3: Secondary | ICD-10-CM | POA: Diagnosis present

## 2021-03-05 DIAGNOSIS — Z818 Family history of other mental and behavioral disorders: Secondary | ICD-10-CM | POA: Diagnosis not present

## 2021-03-05 DIAGNOSIS — F201 Disorganized schizophrenia: Principal | ICD-10-CM | POA: Diagnosis present

## 2021-03-05 DIAGNOSIS — Z86711 Personal history of pulmonary embolism: Secondary | ICD-10-CM

## 2021-03-05 DIAGNOSIS — I10 Essential (primary) hypertension: Secondary | ICD-10-CM | POA: Diagnosis present

## 2021-03-05 DIAGNOSIS — K5900 Constipation, unspecified: Secondary | ICD-10-CM | POA: Diagnosis present

## 2021-03-05 DIAGNOSIS — G35 Multiple sclerosis: Secondary | ICD-10-CM | POA: Diagnosis present

## 2021-03-05 DIAGNOSIS — R2681 Unsteadiness on feet: Secondary | ICD-10-CM | POA: Diagnosis present

## 2021-03-05 LAB — RESP PANEL BY RT-PCR (FLU A&B, COVID) ARPGX2
Influenza A by PCR: NEGATIVE
Influenza B by PCR: NEGATIVE
SARS Coronavirus 2 by RT PCR: NEGATIVE

## 2021-03-05 MED ORDER — ZIPRASIDONE MESYLATE 20 MG IM SOLR
INTRAMUSCULAR | Status: AC
  Filled 2021-03-05: qty 20

## 2021-03-05 MED ORDER — ASENAPINE MALEATE 5 MG SL SUBL
10.0000 mg | SUBLINGUAL_TABLET | Freq: Three times a day (TID) | SUBLINGUAL | Status: DC
  Administered 2021-03-05 (×4): 10 mg via SUBLINGUAL
  Filled 2021-03-05 (×4): qty 2

## 2021-03-05 MED ORDER — LORATADINE 10 MG PO TABS
10.0000 mg | ORAL_TABLET | Freq: Every day | ORAL | Status: DC
  Administered 2021-03-05: 10 mg via ORAL
  Filled 2021-03-05: qty 1

## 2021-03-05 MED ORDER — LATANOPROST 0.005 % OP SOLN
1.0000 [drp] | Freq: Every day | OPHTHALMIC | Status: DC
  Administered 2021-03-05: 1 [drp] via OPHTHALMIC
  Filled 2021-03-05: qty 2.5

## 2021-03-05 MED ORDER — HALOPERIDOL 5 MG PO TABS
10.0000 mg | ORAL_TABLET | Freq: Two times a day (BID) | ORAL | Status: DC
  Administered 2021-03-05 (×3): 10 mg via ORAL
  Filled 2021-03-05 (×3): qty 2

## 2021-03-05 MED ORDER — MAGNESIUM HYDROXIDE 400 MG/5ML PO SUSP: 30.0000 mL | Freq: Every day | ORAL | Status: DC | PRN

## 2021-03-05 MED ORDER — CLONAZEPAM 0.5 MG PO TABS
0.5000 mg | ORAL_TABLET | Freq: Every day | ORAL | Status: DC | PRN
  Administered 2021-03-05: 0.5 mg via ORAL
  Filled 2021-03-05: qty 1

## 2021-03-05 MED ORDER — STERILE WATER FOR INJECTION IJ SOLN
INTRAMUSCULAR | Status: AC
  Filled 2021-03-05: qty 10

## 2021-03-05 MED ORDER — NICOTINE 21 MG/24HR TD PT24
21.0000 mg | MEDICATED_PATCH | Freq: Every day | TRANSDERMAL | Status: DC
  Administered 2021-03-05: 21 mg via TRANSDERMAL
  Filled 2021-03-05: qty 1

## 2021-03-05 MED ORDER — ALUM & MAG HYDROXIDE-SIMETH 200-200-20 MG/5ML PO SUSP
30.0000 mL | ORAL | Status: DC | PRN
  Administered 2021-03-14: 30 mL via ORAL
  Filled 2021-03-05: qty 30

## 2021-03-05 MED ORDER — TOPIRAMATE 25 MG PO TABS
25.0000 mg | ORAL_TABLET | Freq: Two times a day (BID) | ORAL | Status: DC
  Administered 2021-03-05 (×3): 25 mg via ORAL
  Filled 2021-03-05 (×3): qty 1

## 2021-03-05 MED ORDER — METFORMIN HCL ER 500 MG PO TB24
500.0000 mg | ORAL_TABLET | Freq: Two times a day (BID) | ORAL | Status: DC
  Administered 2021-03-05 (×2): 500 mg via ORAL
  Filled 2021-03-05 (×2): qty 1

## 2021-03-05 MED ORDER — AMLODIPINE BESYLATE 5 MG PO TABS
5.0000 mg | ORAL_TABLET | Freq: Every day | ORAL | Status: DC
  Administered 2021-03-05: 5 mg via ORAL
  Filled 2021-03-05: qty 1

## 2021-03-05 MED ORDER — ZOLPIDEM TARTRATE 5 MG PO TABS
5.0000 mg | ORAL_TABLET | Freq: Every evening | ORAL | Status: DC | PRN
  Administered 2021-03-05: 5 mg via ORAL
  Filled 2021-03-05: qty 1

## 2021-03-05 MED ORDER — ACETAMINOPHEN 325 MG PO TABS
650.0000 mg | ORAL_TABLET | Freq: Four times a day (QID) | ORAL | Status: DC | PRN
  Administered 2021-03-13 – 2021-03-25 (×2): 650 mg via ORAL
  Filled 2021-03-05 (×3): qty 2

## 2021-03-05 MED ORDER — STERILE WATER FOR INJECTION IJ SOLN
INTRAMUSCULAR | Status: AC
  Administered 2021-03-05: 10 mL
  Filled 2021-03-05: qty 10

## 2021-03-05 MED ORDER — AMANTADINE HCL 100 MG PO CAPS
100.0000 mg | ORAL_CAPSULE | Freq: Two times a day (BID) | ORAL | Status: DC
  Administered 2021-03-05 (×3): 100 mg via ORAL
  Filled 2021-03-05 (×3): qty 1

## 2021-03-05 MED ORDER — ACETAMINOPHEN 500 MG PO TABS: 500.0000 mg | ORAL_TABLET | Freq: Four times a day (QID) | ORAL | Status: DC | PRN

## 2021-03-05 MED ORDER — ADULT MULTIVITAMIN W/MINERALS CH
1.0000 | ORAL_TABLET | Freq: Every day | ORAL | Status: DC
  Administered 2021-03-05: 1 via ORAL
  Filled 2021-03-05: qty 1

## 2021-03-05 MED ORDER — DOCUSATE SODIUM 100 MG PO CAPS
400.0000 mg | ORAL_CAPSULE | Freq: Every day | ORAL | Status: DC
  Administered 2021-03-05: 400 mg via ORAL
  Filled 2021-03-05: qty 4

## 2021-03-05 MED ORDER — ZIPRASIDONE MESYLATE 20 MG IM SOLR
20.0000 mg | Freq: Once | INTRAMUSCULAR | Status: AC
  Administered 2021-03-05: 20 mg via INTRAMUSCULAR
  Filled 2021-03-05: qty 20

## 2021-03-05 MED ORDER — ONDANSETRON HCL 4 MG PO TABS
4.0000 mg | ORAL_TABLET | Freq: Three times a day (TID) | ORAL | Status: DC | PRN
Start: 2021-03-05 — End: 2021-03-05

## 2021-03-05 MED ORDER — APIXABAN 5 MG PO TABS
5.0000 mg | ORAL_TABLET | Freq: Two times a day (BID) | ORAL | Status: DC
  Administered 2021-03-05 (×3): 5 mg via ORAL
  Filled 2021-03-05 (×3): qty 1

## 2021-03-05 MED ORDER — ZIPRASIDONE MESYLATE 20 MG IM SOLR: 20.0000 mg | Freq: Once | INTRAMUSCULAR | Status: AC

## 2021-03-05 MED ORDER — ZIPRASIDONE MESYLATE 20 MG IM SOLR
INTRAMUSCULAR | Status: AC
  Administered 2021-03-05: 20 mg via INTRAMUSCULAR
  Filled 2021-03-05: qty 20

## 2021-03-05 MED ORDER — ALUM & MAG HYDROXIDE-SIMETH 200-200-20 MG/5ML PO SUSP: 30.0000 mL | Freq: Four times a day (QID) | ORAL | Status: DC | PRN

## 2021-03-05 NOTE — Progress Notes (Signed)
Admission Note: Patient is a 30 year old male admitted to the unit under IVC status from  [facility] for paranoia and hallucinations.  Patient doesn't know why he is here.  Stated his goal is to get into a relationship so he can get married.  Patient is alert and oriented to person.  Patient presents with a flat affect and depressed mood.  Admission plan of care reviewed with consent for treatment signed.  Skin assessment and personal belongings completed.  Skin is dry and intact.  Left-sided weakness noted.  No contraband found.  Patient oriented to the unit, staff and room.  Routine safety checks initiated.  Patient is safe on the unit at this time.

## 2021-03-05 NOTE — ED Notes (Signed)
Patient screamign loudly. Patient threatening this RN stating, "I could box your head off. Bop Bop Bop". Patient is not redirectable. Patient refusing to sit in stretcher. Patient swinging fists at this RN. Security called. Patient remains unable to be redirected. Provider aware

## 2021-03-05 NOTE — ED Notes (Signed)
Pt given meal tray. In room eating.

## 2021-03-05 NOTE — ED Notes (Signed)
Patient pacing, unable to be redirected. Screaming at internal stimuli. Patient redirected to stretcher.

## 2021-03-05 NOTE — ED Notes (Signed)
Pt woke up requesting to use the restroom. Pt ambulated from stretcher to bathroom.Pt then walked out with scrub pants and tops soiled.

## 2021-03-05 NOTE — ED Notes (Signed)
Patient provided with food and drink with Provider approval. Patient denies any further needs at this time. Stretcher in low and locked position. Side rails up x2. Call bell in reach. Patient verbalizes understanding.   

## 2021-03-05 NOTE — ED Provider Notes (Signed)
[facility] EMERGENCY DEPARTMENT Provider Note   CSN: [id number] Arrival date & time: 03/04/21  2039     History Chief Complaint  Patient presents with   IVC   HPI Brandon [name] is a 30 y.o. male.  with past medical history significant for disorganized schizophrenia, multiple sclerosis, hypertension presents emergency department with [facility] for IVC.  Patient was sent here for medical clearance by [facility].  Per [facility] notes, patient presented as a walk-in with complaints that he thinks there is a device in his head.  Patient is a poor historian; however, while trying to obtain HPI, patient appears disorganized, and responding to auditory and visual hallucinations.  When asked if he is hearing or seeing anything he states no.  He is alert and oriented to person, place, date.  When asked why he is at the emergency department he states, "I heard something outside of my door, but I did not go outside."  When asked what he heard he states, "shots."  Between questions, he continues to look around and behind him.  He also makes erratic movements, laughing at stimulus that is not there.  He denies pain.  He denies suicidal/homicidal ideation.  Denies EtOH, tobacco, illicit drug use.  Past Medical History:  Diagnosis Date   ADHD (attention deficit hyperactivity disorder)    Bipolar 1 disorder (HCC)    Chronic back pain    Chronic constipation    Chronic neck pain    Hypertension    Multiple sclerosis (HCC) 05/20/2013   left sided weakness, dysarthria   Non-compliance    Obesity    Pulmonary embolism (HCC)    Schizophrenia (HCC)    Stroke (HCC)    left sided deficits - pt's mother denies this    Patient Active Problem List   Diagnosis Date Noted   Acute metabolic encephalopathy 09/14/2020   Falls 09/13/2020   COVID-19 virus infection 07/17/2020   COVID-19 07/17/2020   Unspecified intellectual disabilities 01/27/2020   Migraine headache 04/18/2019   Generalized  weakness    Pulmonary embolism (HCC) 08/12/2017   Hyperglycemia 07/31/2017   Multiple sclerosis exacerbation (HCC) 07/30/2017   Acute encephalopathy 07/30/2017   Schizoaffective disorder, bipolar type (HCC)    Disorganized schizophrenia (HCC)    Hallucinations 08/05/2014   Auditory hallucination    Left-sided weakness 01/18/2014   Multiple sclerosis (HCC) 05/20/2013   White matter abnormality on MRI of brain 11/23/2012   Unsteady gait 11/23/2012   HTN (hypertension) 07/04/2011   Ataxia 07/02/2011   Obesity, Class III, BMI 40-49.9 (morbid obesity) (HCC) 01/14/2010   ADHD 01/14/2010    Past Surgical History:  Procedure Laterality Date   NO PAST SURGERIES     None     RADIOLOGY WITH ANESTHESIA N/A 01/16/2021   Procedure: MRI WITH ANESTHESIA CERVICAL AND THORASIC SPINE WITH AND WITHOUT CONTRAST;  Surgeon: Radiologist, Medication, MD;  Location: [facility] OR;  Service: Radiology;  Laterality: N/A;   TOOTH EXTRACTION N/A 06/24/2019   Procedure: DENTAL RESTORATION/EXTRACTION OF TEETH NUMBER ONE, SIXTEEN, SEVENTEEN, NINETEEN, THIRTY-TWO;  Surgeon: Ocie Doyne, DDS;  Location: [facility] OR;  Service: Oral Surgery;  Laterality: N/A;    Family History  Problem Relation Age of Onset   Diabetes Mother    ADD / ADHD Brother    Social History   Tobacco Use   Smoking status: Every Day    Packs/day: 0.25    Types: Cigarettes   Smokeless tobacco: Never   Tobacco comments:    2  cigarettes a day  Vaping Use   Vaping Use: Never used  Substance Use Topics   Alcohol use: Not Currently    Alcohol/week: 0.0 standard drinks    Comment: "A little bit"    Drug use: Not Currently    Types: Marijuana    Comment: Last used: unknown    Home Medications Prior to Admission medications   Medication Sig Start Date End Date Taking? Authorizing Provider  acetaminophen (TYLENOL) 500 MG tablet Take 1 tablet (500 mg total) by mouth every 6 (six) hours as needed. Patient taking differently: Take 500 mg by mouth  every 6 (six) hours as needed for moderate pain. 06/21/20  Yes Fayrene Helper, PA-C  amantadine (SYMMETREL) 100 MG capsule Take 100 mg by mouth 2 (two) times daily. 12/26/20  Yes [provider]  amLODipine (NORVASC) 5 MG tablet Take 5 mg by mouth daily. 09/06/20  Yes [provider]  apixaban (ELIQUIS) 5 MG TABS tablet Take 5 mg by mouth 2 (two) times daily.    Yes [provider]  Asenapine Maleate 10 MG SUBL Place 1 tablet (10 mg total) under the tongue in the morning, at noon, and at bedtime. 09/24/20  Yes Zena Amos, MD  clonazePAM (KLONOPIN) 0.5 MG tablet Take 0.5 mg by mouth daily as needed for anxiety. 12/26/20  Yes [provider]  docusate sodium (COLACE) 100 MG capsule Take 400 mg by mouth at bedtime.    Yes [provider]  haloperidol (HALDOL) 10 MG tablet Take 1 tablet (10 mg total) by mouth 2 (two) times daily. 01/17/21 04/13/21 Yes Lynn Ito, MD  haloperidol decanoate (HALDOL DECANOATE) 100 MG/ML injection Inject 3 mLs (300 mg total) into the muscle every 28 (twenty-eight) days. 09/24/20  Yes Zena Amos, MD  latanoprost (XALATAN) 0.005 % ophthalmic solution INSTILL 1 DROP INTO BOTH EYES EVERY NIGHT 11/23/20 11/23/21 Yes   loratadine (CLARITIN) 10 MG tablet Take 10 mg by mouth daily.   Yes [provider]  metFORMIN (GLUCOPHAGE-XR) 500 MG 24 hr tablet Take 500 mg by mouth 2 (two) times daily with a meal. 09/21/20  Yes [provider]  Multiple Vitamin (MULTIVITAMIN WITH MINERALS) TABS tablet Take 1 tablet by mouth daily.   Yes [provider]  ocrelizumab 300 mg in sodium chloride 0.9 % 500 mL Inject 300 mg into the vein every 6 (six) months. CHANGE ::  Ocrevus 600mg  IV EVERY 8 (EIGHT) months. Patient taking differently: Inject 300 mg into the vein See admin instructions. CHANGE ::  Ocrevus 600mg  IV EVERY 8 (EIGHT) months. 11/06/20  Yes , MD  topiramate (TOPAMAX) 25 MG tablet TAKE 1 TABLET (25 MG TOTAL) BY  MOUTH TWO (TWO) TIMES DAILY. Patient taking differently: Take 25 mg by mouth 2 (two) times daily. 10/17/20  Yes York Spaniel, NP  gabapentin (NEURONTIN) 400 MG capsule Take 400 mg by mouth 3 (three) times daily. 12/26/20 01/14/21  [provider]  propranolol (INDERAL) 10 MG tablet Take 1 tablet (10 mg total) by mouth 2 (two) times daily. 05/03/15 01/03/20  05/05/15, NP    Allergies    Patient has no known allergies.  Review of Systems   Review of Systems  Unable to perform ROS: Psychiatric disorder  Psychiatric/Behavioral:  Negative for suicidal ideas.    Physical Exam Updated Vital Signs BP (!) 126/107 (BP Location: Left Arm)   Pulse 86   Temp 98.6 F (37 C) (Oral)   Resp 20   Ht  6\' 1"  (1.854 m)   Wt (!) 147.9 kg   SpO2 100%   BMI 43.01 kg/m   Physical Exam Vitals and nursing note reviewed.  Constitutional:      General: He is not in acute distress.    Appearance: He is not toxic-appearing.  HENT:     Head: Normocephalic and atraumatic.  Eyes:     Extraocular Movements: Extraocular movements intact.     Pupils: Pupils are equal, round, and reactive to light.  Cardiovascular:     Rate and Rhythm: Normal rate and regular rhythm.  Pulmonary:     Effort: Pulmonary effort is normal.     Breath sounds: Normal breath sounds.  Abdominal:     General: Bowel sounds are normal.     Palpations: Abdomen is soft.  Musculoskeletal:     Cervical back: Normal range of motion.  Skin:    General: Skin is warm and dry.     Capillary Refill: Capillary refill takes less than 2 seconds.  Neurological:     General: No focal deficit present.     Mental Status: He is alert and oriented to person, place, and time.  Psychiatric:        Attention and Perception: He perceives auditory and visual hallucinations.        Mood and Affect: Affect is labile.        Speech: Speech is tangential.        Behavior: Behavior is cooperative.        Thought Content: Thought content is  not paranoid. Thought content does not include homicidal or suicidal ideation.        Judgment: Judgment is impulsive.     Comments: Intermittently yelling out and attempting to get out of bed.    ED Results / Procedures / Treatments   Labs (all labs ordered are listed, but only abnormal results are displayed) Labs Reviewed  COMPREHENSIVE METABOLIC PANEL - Abnormal; Notable for the following components:      Result Value   AST 14 (*)    All other components within normal limits  CBC WITH DIFFERENTIAL/PLATELET - Abnormal; Notable for the following components:   MCH 25.7 (*)    Abs Immature Granulocytes 0.10 (*)    All other components within normal limits  RESP PANEL BY RT-PCR (FLU A&B, COVID) ARPGX2  ETHANOL  RAPID URINE DRUG SCREEN, HOSP PERFORMED    EKG None  Radiology No results found.  Procedures Procedures   Medications Ordered in ED Medications  acetaminophen (TYLENOL) tablet 500 mg (has no administration in time range)  amantadine (SYMMETREL) capsule 100 mg (100 mg Oral Given 03/05/21 0441)  amLODipine (NORVASC) tablet 5 mg (has no administration in time range)  apixaban (ELIQUIS) tablet 5 mg (5 mg Oral Given 03/05/21 0441)  clonazePAM (KLONOPIN) tablet 0.5 mg (0.5 mg Oral Given 03/05/21 0031)  docusate sodium (COLACE) capsule 400 mg (400 mg Oral Patient Refused/Not Given 03/05/21 0201)  haloperidol (HALDOL) tablet 10 mg (10 mg Oral Given 03/05/21 0031)  loratadine (CLARITIN) tablet 10 mg (has no administration in time range)  metFORMIN (GLUCOPHAGE-XR) 24 hr tablet 500 mg (has no administration in time range)  multivitamin with minerals tablet 1 tablet (has no administration in time range)  topiramate (TOPAMAX) tablet 25 mg (25 mg Oral Given 03/05/21 0441)  latanoprost (XALATAN) 0.005 % ophthalmic solution 1 drop (1 drop Both Eyes Not Given 03/05/21 0424)  asenapine (SAPHRIS) sublingual tablet 10 mg (10 mg Sublingual Given 03/05/21 0444)  zolpidem (AMBIEN) tablet 5 mg (5  mg Oral Given 03/05/21 0128)  ondansetron (ZOFRAN) tablet 4 mg (has no administration in time range)  alum & mag hydroxide-simeth (MAALOX/MYLANTA) 200-200-20 MG/5ML suspension 30 mL (has no administration in time range)  nicotine (NICODERM CQ - dosed in mg/24 hours) patch 21 mg (has no administration in time range)  sterile water (preservative free) injection (has no administration in time range)  ziprasidone (GEODON) injection 20 mg (20 mg Intramuscular Given 03/05/21 0113)    ED Course  I have reviewed the triage vital signs and the nursing notes.  Pertinent labs & imaging results that were available during my care of the patient were reviewed by me and considered in my medical decision making (see chart for details).   0100: Patient very agitated, standing up out of bed. Security was called to bedside to assist nursing staff. Patient screaming. Will give 20mg  IM Geodon and reassess.  0110: On reassessment, calm and laying on stretcher.  0530: Continues to be calm and cooperative, laying on stretcher.  MDM Rules/Calculators/A&P Patient presents with symptoms consistent with current diagnosis of disorganized schizophrenia.  He is limited in his ability to answer questions appropriately, but continues to be alert and oriented. Unlikely that his presentation is due to organic causes including delirium or drug-induced toxidromes.  It is unclear whether the patient is compliant on his home regimen of medications.   Continues to deny suicidal/homicidal ideations throughout his stay at the emergency department; however, patient intermittently becomes very agitated, screaming, getting out of bed, and appears delusional.  Requiring oral and IM medications.   IVC'd.  TTS consulted.  Patient's medically cleared and safe for transfer to psychiatric care. Final Clinical Impression(s) / ED Diagnoses Final diagnoses:  Schizophrenia, unspecified type [facility])    Rx / DC Orders ED Discharge Orders     None         [facility], PA-C 03/05/21 0543    [place], 03/07/21, DO 03/06/21 2017

## 2021-03-05 NOTE — ED Notes (Signed)
Patient resting in stretcher comfortably. Eyes closed, Equal chest rise and fall. Patient alert to verbal stimuli. Call bell in reach, Stretcher in low and locked position. Side rails up x2.   

## 2021-03-05 NOTE — ED Notes (Signed)
Pt in room talking about hurting staff members to show he can take [place] down

## 2021-03-05 NOTE — ED Notes (Signed)
Report called to Luanna Salk, Charity fundraiser.Patient to move to bed 52

## 2021-03-05 NOTE — ED Notes (Signed)
Sitter escorted pt to the shower in purple zone to get cleaned up after soiling himself. Will continue to monitor to pt.

## 2021-03-05 NOTE — ED Notes (Signed)
Pt was calm and cooperative with [employer] officer.

## 2021-03-05 NOTE — ED Notes (Signed)
Pt agitated and yelling

## 2021-03-05 NOTE — ED Notes (Signed)
Patient ambulatory to bathroom.

## 2021-03-05 NOTE — BH Assessment (Signed)
Clinician messaged Kristopher Glee, RN, " Hey its [place] with TTS. Is the pt able to be assessed?"   RN replied, "Not currently, patient had to be medicated. I will reach out when he is awake."   Redmond Pulling, MS, [facility], [facility] Triage Specialist [phone]

## 2021-03-05 NOTE — ED Notes (Signed)
Rikki Smestad mother [phone]

## 2021-03-05 NOTE — ED Notes (Signed)
Patient resting comfortably with sitter at bedside. No needs expressed at this time.

## 2021-03-05 NOTE — ED Notes (Signed)
Patient screaming at unknown stimuli

## 2021-03-05 NOTE — ED Notes (Signed)
Patient had episode of incontinence while sleeping. This RN assisted patient to bathroom, patient changed into clean burgundy scrubs, bed linen changed. Patient assisted back into stretcher at this time.
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
## 2021-03-05 NOTE — ED Notes (Signed)
Pt at door watching other pt act out. Pt was calm and remained in room.

## 2021-03-05 NOTE — Tx Team (Signed)
Initial Treatment Plan 03/05/2021 10:02 PM Brandon [name] LKT:[id number]    PATIENT STRESSORS: Financial difficulties   Health problems   Occupational concerns     PATIENT STRENGTHS: Motivation for treatment/growth  Supportive family/friends    PATIENT IDENTIFIED PROBLEMS: Paranoia  Hallucinations  Depression                 DISCHARGE CRITERIA:  Adequate post-discharge living arrangements Motivation to continue treatment in a less acute level of care Safe-care adequate arrangements made  PRELIMINARY DISCHARGE PLAN: Attend aftercare/continuing care group Outpatient therapy Return to previous living arrangement  PATIENT/FAMILY INVOLVEMENT: This treatment plan has been presented to and reviewed with the patient, Brandon [name], and/or family member.  The patient and family have been given the opportunity to ask questions and make suggestions.  Clarene Critchley, RN 03/05/2021, 10:02 PM

## 2021-03-05 NOTE — ED Notes (Signed)
Pt woke up from nap and is talking to someone who is not there. Pt is looking around wide eyed saying he can take this person down. Will continue to monitor pt

## 2021-03-05 NOTE — ED Notes (Signed)
Gumecindo Hopkin mother [phone] would like to know what the plan for the patient was

## 2021-03-05 NOTE — Progress Notes (Signed)
Pt accepted to [facility] 503-2   Patient meets inpatient criteria per Daylene Posey, NP   Dr.Amy Mason Jim is the attending provider.    Call report to [phone]    Edd Arbour, RN @ [facility] ED notified.     Pt scheduled  to arrive at [facility] today by 1500.   Damita Dunnings, MSW, LCSW-A  12:41 PM 03/05/2021

## 2021-03-06 ENCOUNTER — Encounter (HOSPITAL_COMMUNITY): Payer: Self-pay

## 2021-03-06 ENCOUNTER — Other Ambulatory Visit (HOSPITAL_COMMUNITY): Payer: Self-pay

## 2021-03-06 DIAGNOSIS — F201 Disorganized schizophrenia: Principal | ICD-10-CM

## 2021-03-06 LAB — GLUCOSE, CAPILLARY: Glucose-Capillary: 107 mg/dL — ABNORMAL HIGH (ref 70–99)

## 2021-03-06 MED ORDER — CLONAZEPAM 0.5 MG PO TABS
0.5000 mg | ORAL_TABLET | Freq: Every day | ORAL | Status: DC | PRN
  Administered 2021-03-06 (×2): 0.5 mg via ORAL
  Filled 2021-03-06 (×3): qty 1

## 2021-03-06 MED ORDER — TOPIRAMATE 25 MG PO TABS
25.0000 mg | ORAL_TABLET | Freq: Two times a day (BID) | ORAL | Status: DC
  Administered 2021-03-06 – 2021-03-28 (×45): 25 mg via ORAL
  Filled 2021-03-06 (×55): qty 1

## 2021-03-06 MED ORDER — LORAZEPAM 1 MG PO TABS
1.0000 mg | ORAL_TABLET | ORAL | Status: AC | PRN
Start: 2021-03-06 — End: 2021-03-08
  Administered 2021-03-08: 1 mg via ORAL
  Filled 2021-03-06: qty 1

## 2021-03-06 MED ORDER — TRAZODONE HCL 100 MG PO TABS
100.0000 mg | ORAL_TABLET | Freq: Once | ORAL | Status: AC
  Administered 2021-03-06: 100 mg via ORAL
  Filled 2021-03-06 (×2): qty 1

## 2021-03-06 MED ORDER — ASENAPINE MALEATE 5 MG SL SUBL
10.0000 mg | SUBLINGUAL_TABLET | Freq: Two times a day (BID) | SUBLINGUAL | Status: DC
  Administered 2021-03-07: 10 mg via SUBLINGUAL
  Filled 2021-03-06 (×6): qty 2

## 2021-03-06 MED ORDER — ZIPRASIDONE MESYLATE 20 MG IM SOLR
20.0000 mg | INTRAMUSCULAR | Status: AC | PRN
  Administered 2021-03-14: 20 mg via INTRAMUSCULAR
  Filled 2021-03-06 (×2): qty 20

## 2021-03-06 MED ORDER — APIXABAN 5 MG PO TABS
5.0000 mg | ORAL_TABLET | Freq: Two times a day (BID) | ORAL | Status: DC
  Administered 2021-03-06 – 2021-03-28 (×45): 5 mg via ORAL
  Filled 2021-03-06 (×52): qty 1

## 2021-03-06 MED ORDER — LORATADINE 10 MG PO TABS
10.0000 mg | ORAL_TABLET | Freq: Every day | ORAL | Status: DC
  Administered 2021-03-06 – 2021-03-28 (×23): 10 mg via ORAL
  Filled 2021-03-06 (×29): qty 1

## 2021-03-06 MED ORDER — METFORMIN HCL ER 500 MG PO TB24
500.0000 mg | ORAL_TABLET | Freq: Two times a day (BID) | ORAL | Status: DC
  Administered 2021-03-06 – 2021-03-23 (×34): 500 mg via ORAL
  Filled 2021-03-06 (×44): qty 1

## 2021-03-06 MED ORDER — OLANZAPINE 10 MG PO TBDP
10.0000 mg | ORAL_TABLET | Freq: Three times a day (TID) | ORAL | Status: DC | PRN
  Administered 2021-03-12 – 2021-03-14 (×3): 10 mg via ORAL
  Filled 2021-03-06 (×3): qty 1

## 2021-03-06 MED ORDER — AMANTADINE HCL 100 MG PO CAPS
100.0000 mg | ORAL_CAPSULE | Freq: Two times a day (BID) | ORAL | Status: DC
  Administered 2021-03-06 – 2021-03-07 (×3): 100 mg via ORAL
  Filled 2021-03-06 (×7): qty 1

## 2021-03-06 MED ORDER — ADULT MULTIVITAMIN W/MINERALS CH
1.0000 | ORAL_TABLET | Freq: Every day | ORAL | Status: DC
  Administered 2021-03-06 – 2021-03-28 (×23): 1 via ORAL
  Filled 2021-03-06 (×27): qty 1

## 2021-03-06 MED ORDER — ASENAPINE MALEATE 5 MG SL SUBL
10.0000 mg | SUBLINGUAL_TABLET | Freq: Three times a day (TID) | SUBLINGUAL | Status: DC
  Administered 2021-03-06 (×2): 10 mg via SUBLINGUAL
  Filled 2021-03-06 (×7): qty 2

## 2021-03-06 MED ORDER — DOCUSATE SODIUM 100 MG PO CAPS
400.0000 mg | ORAL_CAPSULE | Freq: Every day | ORAL | Status: DC
  Administered 2021-03-06 – 2021-03-27 (×20): 400 mg via ORAL
  Filled 2021-03-06 (×26): qty 4

## 2021-03-06 MED ORDER — NICOTINE 14 MG/24HR TD PT24
14.0000 mg | MEDICATED_PATCH | Freq: Every day | TRANSDERMAL | Status: DC
  Administered 2021-03-06 – 2021-03-27 (×18): 14 mg via TRANSDERMAL
  Filled 2021-03-06 (×27): qty 1

## 2021-03-06 MED ORDER — LATANOPROST 0.005 % OP SOLN
1.0000 [drp] | Freq: Every day | OPHTHALMIC | Status: DC
  Administered 2021-03-06 – 2021-03-27 (×20): 1 [drp] via OPHTHALMIC
  Filled 2021-03-06: qty 2.5

## 2021-03-06 MED ORDER — AMLODIPINE BESYLATE 5 MG PO TABS
5.0000 mg | ORAL_TABLET | Freq: Every day | ORAL | Status: DC
  Administered 2021-03-06 – 2021-03-28 (×23): 5 mg via ORAL
  Filled 2021-03-06 (×25): qty 1

## 2021-03-06 NOTE — H&P (Addendum)
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  On exam today patient is very sedated and disorganized.  While attempting to interview him he would suddenly unfocused from the conversation staring straight ahead.  He reported that he was listening to the voices.  He was able to answer that he has no SI, HI, or visual hallucinations.  When asked to elaborate on the auditory hallucinations he was unable to.  Per chart review it is known that he is a patient of Dr. Mervyn Skeeters at the [facility] and that he was recently switched from Haldol oral and LAI to Saphris.  Patient does not know when or why this happened at this point in time.  Patient is able to tell us that his mother is his legal guardian and that he did have an ACT team and that he does have MS.  He was able to tell us he goes to the [facility] day program during the week. However, at this point we were unable to get him to answer any other questions and so had to obtain the rest of this HPI from chart review.  We will plan to reach out to his mother later today or tomorrow for more information.   Per ED Psych Provider Note- "Brandon [name], 30 y.o., male patient seen face to face by this provider, consulted with Dr. Earlene Plater; and chart reviewed on 03/04/21.  On evaluation Brandon [name] reports he doesn't know why he was brought here.  States he was brought in by the police for "no reason."  Patient denies suicidal/homicidal ideation.  Patient asked if he felt he  had a device in his skin and he stated "yes" and pinched at his arm and then his leg stating it was in his leg.  Patient then turned his head to the side and started laughing and talking to someone that was not there.  Patient asked who he was talking to and he stated "Brandon [name]."    Patient asked who Brandon [name] was and he stated it was his father.  Asked if he could see Brandon [name] and he did not respond.  Patient reports that he lives with his mother.  Patient also reports that he has been prescribed medications that he do not take because he doesn't have schizophrenia.  Patient states that he smokes "Loud" marijuana to help with his voices and the last time he smoked was 3 or 4 weeks ago.  States when he doesn't smoke the voices come back.  Currently patient denies auditory and visual hallucinations but he is seen looking and talking to someone that is not there.  Patient is not a good historian and he was brought in by [facility] police. "  According to notes from March 2022 visit with Dr. Evelene Croon: Patient had Haldol dec dose increased to 300mg  from 200mg  in March and was continued on Haldol 10mg  bid; He was on Saphris 10mg  tid, Cogentin 1mg  bid, and Trazodone 100mg  -200mg  qhs for sleep.   Associated Signs/Symptoms: Depression Symptoms:   Reports None Duration of Depression Symptoms: N/A  (Hypo) Manic Symptoms:  Reports none Anxiety Symptoms:   Reports none Psychotic Symptoms:  Hallucinations: Auditory PTSD Symptoms: NA Total Time spent with patient: 30 minutes  Past Psychiatric History: Disorganized Schizophrenia, recently stopped Haldol PO and LAI and started on Saphris.  Past meds tried per outpatient notes: Risperidone, Invega, Abilify, Olanzapine, Clozapine (in 2019), Doxepin, Trazodone.  Is the patient at risk to self? No.  Has the patient been a risk to self in the past 6 months? No.  Has the patient been a risk to self within the distant past? No.  Is the patient a risk to others? No.   Has the patient been a risk to others in the past 6 months? No.  Has the patient been a risk to others within the distant past? No.   Prior Inpatient Therapy:   Prior Outpatient Therapy:    Alcohol Screening:   Substance Abuse History in the last 12 months:  No. Consequences of Substance Abuse: NA Previous Psychotropic Medications: Yes  Psychological Evaluations: No  Past Medical History:  Past Medical History:  Diagnosis Date   ADHD (attention deficit hyperactivity disorder)    Bipolar 1 disorder (HCC)    Chronic back pain    Chronic constipation    Chronic neck pain    Hypertension    Multiple sclerosis (HCC) 05/20/2013   left sided weakness, dysarthria   Non-compliance    Obesity    Pulmonary embolism (HCC)    Schizophrenia (HCC)    Stroke (HCC)    left sided deficits - pt's mother denies this    Past Surgical History:  Procedure Laterality Date   NO PAST SURGERIES     None     RADIOLOGY WITH ANESTHESIA N/A 01/16/2021   Procedure: MRI WITH ANESTHESIA CERVICAL AND THORASIC SPINE WITH AND WITHOUT CONTRAST;  Surgeon: Radiologist, Medication, MD;  Location: [facility] OR;  Service: Radiology;  Laterality: N/A;   TOOTH EXTRACTION N/A 06/24/2019   Procedure: DENTAL RESTORATION/EXTRACTION OF TEETH NUMBER ONE, SIXTEEN, SEVENTEEN, NINETEEN, THIRTY-TWO;  Surgeon: Ocie DoyneJensen, Scott, DDS;  Location: [facility] OR;  Service: Oral Surgery;  Laterality: N/A;   Family History:  Family History  Problem Relation Age of Onset   Diabetes Mother    ADD / ADHD Brother    Family Psychiatric  History: Unable to obtain Tobacco Screening:   Social History:  Social History   Substance and Sexual Activity  Alcohol Use Not Currently   Alcohol/week: 0.0 standard drinks   Comment: "A little bit"      Social History   Substance and Sexual Activity  Drug Use Not Currently   Types: Marijuana   Comment: Last used: unknown    Allergies:  No Known Allergies Lab Results:  Results for orders placed or  performed during the hospital encounter of 03/04/21 (from the past 48 hour(s))  Resp Panel by RT-PCR (Flu A&B, Covid) Nasopharyngeal Swab     Status: None   Collection Time: 03/04/21  9:05 PM   Specimen: Nasopharyngeal Swab; Nasopharyngeal(NP) swabs in vial transport medium  Result Value Ref Range   SARS Coronavirus 2 by RT PCR NEGATIVE NEGATIVE    Comment: (NOTE) SARS-CoV-2 target nucleic acids are NOT DETECTED.  The SARS-CoV-2 RNA is generally detectable in upper respiratory specimens during the acute phase of infection. The lowest concentration of SARS-CoV-2 viral copies this assay can detect is 138 copies/mL. A negative result does not preclude SARS-Cov-2 infection and should not be used as the sole basis for treatment or other patient management decisions. A negative  result may occur with  improper specimen collection/handling, submission of specimen other than nasopharyngeal swab, presence of viral mutation(s) within the areas targeted by this assay, and inadequate number of viral copies(<138 copies/mL). A negative result must be combined with clinical observations, patient history, and epidemiological information. The expected result is Negative.  Fact Sheet for Patients:  BloggerCourse.com  Fact Sheet for Healthcare Providers:  SeriousBroker.it  This test is no t yet approved or cleared by the [place] FDA and  has been authorized for detection and/or diagnosis of SARS-CoV-2 by FDA under an Emergency Use Authorization (EUA). This EUA will remain  in effect (meaning this test can be used) for the duration of the COVID-19 declaration under Section 564(b)(1) of the Act, 21 U.S.C.section 360bbb-3(b)(1), unless the authorization is terminated  or revoked sooner.       Influenza A by PCR NEGATIVE NEGATIVE   Influenza B by PCR NEGATIVE NEGATIVE    Comment: (NOTE) The Xpert Xpress SARS-CoV-2/FLU/RSV plus assay is intended  as an aid in the diagnosis of influenza from Nasopharyngeal swab specimens and should not be used as a sole basis for treatment. Nasal washings and aspirates are unacceptable for Xpert Xpress SARS-CoV-2/FLU/RSV testing.  Fact Sheet for Patients: BloggerCourse.com  Fact Sheet for Healthcare Providers: SeriousBroker.it  This test is not yet approved or cleared by the [place] FDA and has been authorized for detection and/or diagnosis of SARS-CoV-2 by FDA under an Emergency Use Authorization (EUA). This EUA will remain in effect (meaning this test can be used) for the duration of the COVID-19 declaration under Section 564(b)(1) of the Act, 21 U.S.C. section 360bbb-3(b)(1), unless the authorization is terminated or revoked.  Performed at [facility], [address], [place], [place] [address]   Urine rapid drug screen (hosp performed)     Status: None   Collection Time: 03/04/21  9:05 PM  Result Value Ref Range   Opiates NONE DETECTED NONE DETECTED   Cocaine NONE DETECTED NONE DETECTED   Benzodiazepines NONE DETECTED NONE DETECTED   Amphetamines NONE DETECTED NONE DETECTED   Tetrahydrocannabinol NONE DETECTED NONE DETECTED   Barbiturates NONE DETECTED NONE DETECTED    Comment: (NOTE) DRUG SCREEN FOR MEDICAL PURPOSES ONLY.  IF CONFIRMATION IS NEEDED FOR ANY PURPOSE, NOTIFY LAB WITHIN 5 DAYS.  LOWEST DETECTABLE LIMITS FOR URINE DRUG SCREEN Drug Class                     Cutoff (ng/mL) Amphetamine and metabolites    1000 Barbiturate and metabolites    200 Benzodiazepine                 200 Tricyclics and metabolites     300 Opiates and metabolites        300 Cocaine and metabolites        300 THC                            50 Performed at [facility], [address], [place], [place] [address]   Comprehensive metabolic panel     Status: Abnormal   Collection Time: 03/04/21  9:07 PM  Result Value Ref  Range   Sodium 138 135 - 145 mmol/L   Potassium 3.8 3.5 - 5.1 mmol/L   Chloride 107 98 - 111 mmol/L   CO2 24 22 - 32 mmol/L   Glucose, Bld 95 70 - 99 mg/dL  Comment: Glucose reference range applies only to samples taken after fasting for at least 8 hours.   BUN 14 6 - 20 mg/dL   Creatinine, Ser 8.93 0.61 - 1.24 mg/dL   Calcium 9.1 8.9 - 81.0 mg/dL   Total Protein 6.6 6.5 - 8.1 g/dL   Albumin 4.1 3.5 - 5.0 g/dL   AST 14 (L) 15 - 41 U/L   ALT 27 0 - 44 U/L   Alkaline Phosphatase 78 38 - 126 U/L   Total Bilirubin 0.6 0.3 - 1.2 mg/dL   GFR, Estimated >17 >51 mL/min    Comment: (NOTE) Calculated using the CKD-EPI Creatinine Equation (2021)    Anion gap 7 5 - 15    Comment: Performed at [facility], [address] [place], [place] [address]  CBC with Diff     Status: Abnormal   Collection Time: 03/04/21  9:07 PM  Result Value Ref Range   WBC 9.3 4.0 - 10.5 K/uL   RBC 5.17 4.22 - 5.81 MIL/uL   Hemoglobin 13.3 13.0 - 17.0 g/dL   HCT 27.7 82.4 - 23.5 %   MCV 83.9 80.0 - 100.0 fL   MCH 25.7 (L) 26.0 - 34.0 pg   MCHC 30.6 30.0 - 36.0 g/dL   RDW 36.1 44.3 - 15.4 %   Platelets 210 150 - 400 K/uL   nRBC 0.0 0.0 - 0.2 %   Neutrophils Relative % 60 %   Neutro Abs 5.6 1.7 - 7.7 K/uL   Lymphocytes Relative 27 %   Lymphs Abs 2.5 0.7 - 4.0 K/uL   Monocytes Relative 8 %   Monocytes Absolute 0.7 0.1 - 1.0 K/uL   Eosinophils Relative 3 %   Eosinophils Absolute 0.3 0.0 - 0.5 K/uL   Basophils Relative 1 %   Basophils Absolute 0.1 0.0 - 0.1 K/uL   Immature Granulocytes 1 %   Abs Immature Granulocytes 0.10 (H) 0.00 - 0.07 K/uL    Comment: Performed at [facility], [address], [place], [place] [address]  Ethanol     Status: None   Collection Time: 03/04/21  9:08 PM  Result Value Ref Range   Alcohol, Ethyl (B) <10 <10 mg/dL    Comment: (NOTE) Lowest detectable limit for serum alcohol is 10 mg/dL.  For medical purposes only. Performed at [facility], [address] [place], [place] [address]     Blood Alcohol level:  Lab Results  Component Value Date   [facility] <10 03/04/2021   ETH <10 09/13/2020    Metabolic Disorder Labs:  Lab Results  Component Value Date   HGBA1C 5.9 (H) 04/30/2020   MPG 123 04/30/2020   MPG 122.63 07/31/2017   Lab Results  Component Value Date   PROLACTIN 13.6 04/30/2020   Lab Results  Component Value Date   CHOL 141 04/30/2020   TRIG 134 04/30/2020   HDL 37 (L) 04/30/2020   CHOLHDL 3.8 04/30/2020   VLDL 27 04/30/2020   LDLCALC 77 04/30/2020   LDLCALC 70 01/29/2011    Current Medications: Current Facility-Administered Medications  Medication Dose Route Frequency Provider Last Rate Last Admin   acetaminophen (TYLENOL) tablet 650 mg  650 mg Oral Q6H PRN Rankin, Shuvon B, NP       alum & mag hydroxide-simeth (MAALOX/MYLANTA) 200-200-20 MG/5ML suspension 30 mL  30 mL Oral Q4H PRN Rankin, Shuvon B, NP       amantadine (SYMMETREL) capsule 100 mg  100 mg Oral  BID Nira Conn A, NP   100 mg at 03/06/21 0818   amLODipine (NORVASC) tablet 5 mg  5 mg Oral Daily Nira Conn A, NP   5 mg at 03/06/21 1610   apixaban (ELIQUIS) tablet 5 mg  5 mg Oral BID Nira Conn A, NP   5 mg at 03/06/21 0819   [START ON 03/07/2021] asenapine (SAPHRIS) sublingual tablet 10 mg  10 mg Sublingual BID Lauro Franklin, MD       clonazePAM Scarlette Calico) tablet 0.5 mg  0.5 mg Oral Daily PRN Nira Conn A, NP   0.5 mg at 03/06/21 0114   docusate sodium (COLACE) capsule 400 mg  400 mg Oral QHS Nira Conn A, NP       latanoprost (XALATAN) 0.005 % ophthalmic solution 1 drop  1 drop Both Eyes QHS Nira Conn A, NP       loratadine (CLARITIN) tablet 10 mg  10 mg Oral Daily Nira Conn A, NP   10 mg at 03/06/21 0819   OLANZapine zydis (ZYPREXA) disintegrating tablet 10 mg  10 mg Oral Q8H PRN Comer Locket, MD       And   LORazepam (ATIVAN) tablet 1 mg  1 mg Oral PRN Comer Locket, MD       And   ziprasidone (GEODON) injection 20 mg  20  mg Intramuscular PRN Comer Locket, MD       magnesium hydroxide (MILK OF MAGNESIA) suspension 30 mL  30 mL Oral Daily PRN Rankin, Shuvon B, NP       metFORMIN (GLUCOPHAGE-XR) 24 hr tablet 500 mg  500 mg Oral BID WC Nira Conn A, NP   500 mg at 03/06/21 0818   multivitamin with minerals tablet 1 tablet  1 tablet Oral Daily Nira Conn A, NP   1 tablet at 03/06/21 9604   nicotine (NICODERM CQ - dosed in mg/24 hours) patch 14 mg  14 mg Transdermal Daily Nira Conn A, NP   14 mg at 03/06/21 0820   topiramate (TOPAMAX) tablet 25 mg  25 mg Oral BID Nira Conn A, NP   25 mg at 03/06/21 5409   PTA Medications: Medications Prior to Admission  Medication Sig Dispense Refill Last Dose   acetaminophen (TYLENOL) 500 MG tablet Take 1 tablet (500 mg total) by mouth every 6 (six) hours as needed. (Patient taking differently: Take 500 mg by mouth every 6 (six) hours as needed for moderate pain.) 30 tablet 0    amantadine (SYMMETREL) 100 MG capsule Take 100 mg by mouth 2 (two) times daily.      amLODipine (NORVASC) 5 MG tablet Take 5 mg by mouth daily.      apixaban (ELIQUIS) 5 MG TABS tablet Take 5 mg by mouth 2 (two) times daily.       Asenapine Maleate 10 MG SUBL Place 1 tablet (10 mg total) under the tongue in the morning, at noon, and at bedtime. 90 tablet 1    clonazePAM (KLONOPIN) 0.5 MG tablet Take 0.5 mg by mouth daily as needed for anxiety.      docusate sodium (COLACE) 100 MG capsule Take 400 mg by mouth at bedtime.       haloperidol (HALDOL) 10 MG tablet Take 1 tablet (10 mg total) by mouth 2 (two) times daily. 60 tablet 0    haloperidol decanoate (HALDOL DECANOATE) 100 MG/ML injection Inject 3 mLs (300 mg total) into the muscle every 28 (twenty-eight) days. 3 mL 3  latanoprost (XALATAN) 0.005 % ophthalmic solution INSTILL 1 DROP INTO BOTH EYES EVERY NIGHT 7.5 mL 10    loratadine (CLARITIN) 10 MG tablet Take 10 mg by mouth daily.      metFORMIN (GLUCOPHAGE-XR) 500 MG 24 hr tablet Take 500  mg by mouth 2 (two) times daily with a meal.      Multiple Vitamin (MULTIVITAMIN WITH MINERALS) TABS tablet Take 1 tablet by mouth daily.      ocrelizumab 300 mg in sodium chloride 0.9 % 500 mL Inject 300 mg into the vein every 6 (six) months. CHANGE ::  Ocrevus 600mg  IV EVERY 8 (EIGHT) months. (Patient taking differently: Inject 300 mg into the vein See admin instructions. CHANGE ::  Ocrevus 600mg  IV EVERY 8 (EIGHT) months.) 1 each 2    topiramate (TOPAMAX) 25 MG tablet TAKE 1 TABLET (25 MG TOTAL) BY MOUTH TWO (TWO) TIMES DAILY. (Patient taking differently: Take 25 mg by mouth 2 (two) times daily.) 60 tablet 5     Musculoskeletal: Strength & Muscle Tone: within normal limits Gait & Station:  in bed during exam Patient leans: N/A   Psychiatric Specialty Exam:  Presentation  General Appearance: Casually dressed but unkempt appearing  Eye Contact:Minimal  Speech:periods of speech latency and slurring of speech with some mild dysarthria  Speech Volume:Decreased  Handedness:Right   Mood and Affect  Mood:aloof, sedated, dysphoric  Affect:Flat; Blunt,guarded   Thought Process  Thought Processes:Disorganized, concrete with periods of thought blocking  Duration of Psychotic Symptoms: Greater than six months  Past Diagnosis of Schizophrenia or Psychoactive disorder: Yes  Descriptions of Associations:Loose  Orientation:Full (Time, Place and Person)  Thought Content: Admits to paranoia, AH, thought broadcasting and appears internally preoccupied on exam; denies ideas of reference or thought insertion/withdrawal  Hallucinations:Hallucinations: Auditory Description of Auditory Hallucinations: reports hearing voices Ideas of Reference:Denied  Suicidal Thoughts:Suicidal Thoughts: No Homicidal Thoughts:Homicidal Thoughts: No  Sensorium  Memory:Immediate Poor; Recent Poor  Judgment:Impaired  Insight:Lacking   Executive Functions  Concentration:Poor  Attention  Span:Poor  Recall:Poor  Fund of Knowledge:Poor  Language:Fair   Psychomotor Activity  Psychomotor Activity: Psychomotor Activity: Psychomotor Retardation  Assets  Assets:Resilience, has a guardian, desire for improvement   Sleep  Sleep: fair   Physical Exam: Physical Exam Vitals and nursing note reviewed.  Constitutional:      General: He is not in acute distress.    Appearance: Normal appearance. He is obese. He is not ill-appearing or toxic-appearing.  HENT:     Head: Normocephalic and atraumatic.  Pulmonary:     Effort: Pulmonary effort is normal.  Musculoskeletal:        General: Normal range of motion.  Neurological:     Mental Status: He is alert.     Comments: Some dysarthria on exam - uncooperative for neuro testing   Review of Systems  Unable to perform ROS: Acuity of condition  Blood pressure 131/89, pulse 75, temperature 98 F (36.7 C), temperature source Oral, resp. rate 16, height 5\' 11"  (1.803 m), weight (!) 145.2 kg, SpO2 100 %. Body mass index is 44.63 kg/m.  Treatment Plan Summary: Daily contact with patient to assess and evaluate symptoms and progress in treatment   Mr. Manzer is a 30 yr old male who presents under IVC for acute psychosis. 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   At this time patient appears very sedated so we will reduce Saphris from 3 times daily dosing to twice daily dosing.  At  this point we will need to contact his mother who is his legal guardian to determine why he was switched from Haldol to Saphris and when he received his last Haldol dec injection.  We will start an agitation protocol.  We will continue to monitor.   Disorganized Schizophrenia: -Reduce Saphris to 10 mg BID due to present oversedation and talk with mother about potential restart of Haldol - attempting to clarify when he got his last Haldol dec LAI injection -Start Agitation Protocol:  Zyprexa/Ativan/Geodon -Continue Amantadine 100 mg BID with hope of being able to discontinue - will attempt to clarify with neurology and outpatient psychiatry if this was added for management of possible side-effects when he previously was on high doses of 2 antipsychotics - due to current psychosis and fall risk will hope to discontinue if possible - Continue Klonopin 0.5mg  daily PRN  Nicotine Dependence: -Continue Nicotine Patch 14 mg daily  HTN: -Continue Amlodipine 5 mg daily  H/o PE -Continue Eliquis 5 mg BID  Chronic Constipation:  -Continue Colace 400 mg QHS  Obesity: -Continue Topamax 25 mg BID and continue Metformin 24 hr 500 mg BID as written by PCP  MS/gait disorder: - Reportedly getting Ocrevus by neurology  - Ordering PT consult for gait assessment and placed on fall precautions  Other home meds: -Continue Xalatan 0.005% 1 drop both eyes QHS -Continue Claritin 10 mg daily  -Continue PRN's: Tylenol, Maalox, Atarax, Milk of Magnesia, Trazodone   Observation Level/Precautions:  15 minute checks  Laboratory:  CMP: WNL  CBC: MCH: 25.7 (low)  UDS- Neg Lipid Panel/A1c ordered  Psychotherapy:    Medications:  Saphris, Klonopin, Xalatan, Metformin, Topamax, Norvasc, Amantadine, Eliquis  Consultations:    Discharge Concerns:    Estimated LOS:5-7 days  Other:     Physician Treatment Plan for Primary Diagnosis: Schizophrenia, disorganized type (HCC) Long Term Goal(s): Improvement in symptoms so as ready for discharge  Short Term Goals: Ability to identify changes in lifestyle to reduce recurrence of condition will improve, Ability to verbalize feelings will improve, Ability to identify and develop effective coping behaviors will improve, Compliance with prescribed medications will improve, and Ability to identify triggers associated with substance abuse/mental health issues will improve  Physician Treatment Plan for Secondary Diagnosis: Principal Problem:    Schizophrenia, disorganized type (HCC)  Long Term Goal(s): Improvement in symptoms so as ready for discharge  Short Term Goals: Ability to identify changes in lifestyle to reduce recurrence of condition will improve, Ability to verbalize feelings will improve, Ability to identify and develop effective coping behaviors will improve, Compliance with prescribed medications will improve, and Ability to identify triggers associated with substance abuse/mental health issues will improve  I certify that inpatient services furnished can reasonably be expected to improve the patient's condition.    Lauro Franklin, MD 8/31/20224:17 PM
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
## 2021-03-06 NOTE — Progress Notes (Addendum)
   03/06/21 1100  Psych Admission Type (Psych Patients Only)  Admission Status Voluntary  Psychosocial Assessment  Patient Complaints None;Decreased concentration;Crying spells  Eye Contact Brief  Facial Expression Flat  Affect Flat  Speech Slow  Interaction Minimal  Motor Activity Slow;Unsteady  Appearance/Hygiene Unremarkable  Behavior Characteristics Cooperative;Calm  Mood Depressed  Aggressive Behavior  Effect No apparent injury  Thought Process  Coherency Blocking  Content Delusions  Delusions Paranoid  Perception Hallucinations  Hallucination None reported or observed  Judgment Limited  Confusion Mild  Danger to Self  Current suicidal ideation? Denies  Danger to Others  Danger to Others None reported or observed    D. Pt presented with a flat affect, depressed mood- guarded, but cooperative behavior.  Pt appears to be thought blocking- slow to respond to questions. Pt denied SI/HI , but endorses AH, stated, "I don't think any medication will make them go away". A. Labs and vitals monitored. Pt compliant with medications. Pt supported emotionally and encouraged to express concerns and ask questions.   R. Pt remains safe with 15 minute checks. Will continue POC.

## 2021-03-06 NOTE — BHH Group Notes (Signed)
BHH Group Notes:  (Nursing)  Date:  03/06/2021  Time:  1415  Type of Therapy:  Group Therapy  Participation Level:  Did Not Attend    [name] 03/06/2021, 6:45 PM

## 2021-03-06 NOTE — BHH Group Notes (Signed)
The focus of this group is to help patients establish daily goals to achieve during treatment and discuss how the patient can incorporate goal setting into their daily lives to aide in recovery.  Pt did not attend group 

## 2021-03-06 NOTE — Progress Notes (Signed)
Pt visible in the dayroom much of the evening. Pt did not have anything ordered for sleep and pt was asked if he needed anything to help him sleep and stated he has taken Trazodone in the past. NP-cody informed and order put in for 1 x 100 mg Trazodone and was given per [facility].     03/06/21 2100  Psych Admission Type (Psych Patients Only)  Admission Status Voluntary  Psychosocial Assessment  Patient Complaints Suspiciousness  Eye Contact Brief  Facial Expression Flat  Affect Flat  Speech Slow  Interaction Minimal  Motor Activity Slow;Unsteady  Appearance/Hygiene Unremarkable  Behavior Characteristics Cooperative;Guarded  Mood Suspicious;Preoccupied  Aggressive Behavior  Effect No apparent injury  Thought Process  Coherency Blocking  Content Delusions  Delusions Paranoid  Perception Hallucinations  Hallucination None reported or observed  Judgment Limited  Confusion Mild  Danger to Self  Current suicidal ideation? Denies  Danger to Others  Danger to Others None reported or observed

## 2021-03-06 NOTE — Progress Notes (Signed)
Recreation Therapy Notes  INPATIENT RECREATION THERAPY ASSESSMENT  Patient Details Name: Brandon [name] MRN: [id number] DOB: 1991/04/27 Today's Date: 03/06/2021       Information Obtained From: Patient  Able to Participate in Assessment/Interview: Yes  Patient Presentation: Alert (Slow to answer)  Reason for Admission (Per Patient): Patient Unable to Identify, Other (Comments) (Per chart: Paranoia, Hallucinations, Depression)  Patient Stressors:  (None identified)  Coping Skills:   Isolation, TV, Sports, Journal, Music, Exercise, Deep Breathing, Meditate, Substance Abuse, Talk, Art, Prayer, Avoidance, Read, Hot Bath/Shower  Leisure Interests (2+):  Art - Draw  Frequency of Recreation/Participation: Weekly  Awareness of Community Resources:  Yes  Community Resources:  [place], [place]  Current Use: Yes  If no, Barriers?:    Expressed Interest in [employer] Information: No  [place]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

## 2021-03-06 NOTE — BH IP Treatment Plan (Signed)
Interdisciplinary Treatment and Diagnostic Plan Update  03/06/2021 Time of Session: 9:55am  Brandon [name] MRN: [id number]  Principal Diagnosis: <principal problem not specified>  Secondary Diagnoses: Active Problems:   Schizophrenia, disorganized type ([place])   Current Medications:  Current Facility-Administered Medications  Medication Dose Route Frequency Provider Last Rate Last Admin   acetaminophen (TYLENOL) tablet 650 mg  650 mg Oral Q6H PRN Rankin, Shuvon B, NP       alum & mag hydroxide-simeth (MAALOX/MYLANTA) 200-200-20 MG/5ML suspension 30 mL  30 mL Oral Q4H PRN Rankin, Shuvon B, NP       amantadine (SYMMETREL) capsule 100 mg  100 mg Oral BID Lindon Romp A, NP   100 mg at 03/06/21 0818   amLODipine (NORVASC) tablet 5 mg  5 mg Oral Daily Lindon Romp A, NP   5 mg at 03/06/21 4098   apixaban (ELIQUIS) tablet 5 mg  5 mg Oral BID Lindon Romp A, NP   5 mg at 03/06/21 0819   asenapine (SAPHRIS) sublingual tablet 10 mg  10 mg Sublingual TID Rozetta Nunnery, NP   10 mg at 03/06/21 1124   clonazePAM (KLONOPIN) tablet 0.5 mg  0.5 mg Oral Daily PRN Lindon Romp A, NP   0.5 mg at 03/06/21 0114   docusate sodium (COLACE) capsule 400 mg  400 mg Oral QHS Lindon Romp A, NP       latanoprost (XALATAN) 0.005 % ophthalmic solution 1 drop  1 drop Both Eyes QHS Lindon Romp A, NP       loratadine (CLARITIN) tablet 10 mg  10 mg Oral Daily Lindon Romp A, NP   10 mg at 03/06/21 1191   magnesium hydroxide (MILK OF MAGNESIA) suspension 30 mL  30 mL Oral Daily PRN Rankin, Shuvon B, NP       metFORMIN (GLUCOPHAGE-XR) 24 hr tablet 500 mg  500 mg Oral BID WC Lindon Romp A, NP   500 mg at 03/06/21 0818   multivitamin with minerals tablet 1 tablet  1 tablet Oral Daily Lindon Romp A, NP   1 tablet at 03/06/21 4782   nicotine (NICODERM CQ - dosed in mg/24 hours) patch 14 mg  14 mg Transdermal Daily Lindon Romp A, NP   14 mg at 03/06/21 0820   topiramate (TOPAMAX) tablet 25 mg  25 mg Oral BID Lindon Romp A,  NP   25 mg at 03/06/21 9562   PTA Medications: Medications Prior to Admission  Medication Sig Dispense Refill Last Dose   acetaminophen (TYLENOL) 500 MG tablet Take 1 tablet (500 mg total) by mouth every 6 (six) hours as needed. (Patient taking differently: Take 500 mg by mouth every 6 (six) hours as needed for moderate pain.) 30 tablet 0    amantadine (SYMMETREL) 100 MG capsule Take 100 mg by mouth 2 (two) times daily.      amLODipine (NORVASC) 5 MG tablet Take 5 mg by mouth daily.      apixaban (ELIQUIS) 5 MG TABS tablet Take 5 mg by mouth 2 (two) times daily.       Asenapine Maleate 10 MG SUBL Place 1 tablet (10 mg total) under the tongue in the morning, at noon, and at bedtime. 90 tablet 1    clonazePAM (KLONOPIN) 0.5 MG tablet Take 0.5 mg by mouth daily as needed for anxiety.      docusate sodium (COLACE) 100 MG capsule Take 400 mg by mouth at bedtime.       haloperidol (HALDOL) 10 MG tablet  Take 1 tablet (10 mg total) by mouth 2 (two) times daily. 60 tablet 0    haloperidol decanoate (HALDOL DECANOATE) 100 MG/ML injection Inject 3 mLs (300 mg total) into the muscle every 28 (twenty-eight) days. 3 mL 3    latanoprost (XALATAN) 0.005 % ophthalmic solution INSTILL 1 DROP INTO BOTH EYES EVERY NIGHT 7.5 mL 10    loratadine (CLARITIN) 10 MG tablet Take 10 mg by mouth daily.      metFORMIN (GLUCOPHAGE-XR) 500 MG 24 hr tablet Take 500 mg by mouth 2 (two) times daily with a meal.      Multiple Vitamin (MULTIVITAMIN WITH MINERALS) TABS tablet Take 1 tablet by mouth daily.      ocrelizumab 300 mg in sodium chloride 0.9 % 500 mL Inject 300 mg into the vein every 6 (six) months. CHANGE ::  Ocrevus 668m IV EVERY 8 (EIGHT) months. (Patient taking differently: Inject 300 mg into the vein See admin instructions. CHANGE ::  Ocrevus 6078mIV EVERY 8 (EIGHT) months.) 1 each 2    topiramate (TOPAMAX) 25 MG tablet TAKE 1 TABLET (25 MG TOTAL) BY MOUTH TWO (TWO) TIMES DAILY. (Patient taking differently: Take 25 mg  by mouth 2 (two) times daily.) 60 tablet 5     Patient Stressors: Financial difficulties   Health problems   Occupational concerns    Patient Strengths: Motivation for treatment/growth  Supportive family/friends   Treatment Modalities: Medication Management, Group therapy, Case management,  1 to 1 session with clinician, Psychoeducation, Recreational therapy.   Physician Treatment Plan for Primary Diagnosis: <principal problem not specified> Long Term Goal(s):     Short Term Goals:    Medication Management: Evaluate patient's response, side effects, and tolerance of medication regimen.  Therapeutic Interventions: 1 to 1 sessions, Unit Group sessions and Medication administration.  Evaluation of Outcomes: Not Met  Physician Treatment Plan for Secondary Diagnosis: Active Problems:   Sch[place]ia, disorganized type ([place] Long Term Goal(s):     Short Term Goals:       Medication Management: Evaluate patient's response, side effects, and tolerance of medication regimen.  Therapeutic Interventions: 1 to 1 sessions, Unit Group sessions and Medication administration.  Evaluation of Outcomes: Not Met   RN Treatment Plan for Primary Diagnosis: <principal problem not specified> Long Term Goal(s): Knowledge of disease and therapeutic regimen to maintain health will improve  Short Term Goals: Ability to remain free from injury will improve, Ability to participate in decision making will improve, Ability to verbalize feelings will improve, Ability to disclose and discuss suicidal ideas, and Ability to identify and develop effective coping behaviors will improve  Medication Management: RN will administer medications as ordered by provider, will assess and evaluate patient's response and provide education to patient for prescribed medication. RN will report any adverse and/or side effects to prescribing provider.  Therapeutic Interventions: 1 on 1 counseling sessions, Psychoeducation,  Medication administration, Evaluate responses to treatment, Monitor vital signs and CBGs as ordered, Perform/monitor CIWA, COWS, AIMS and Fall Risk screenings as ordered, Perform wound care treatments as ordered.  Evaluation of Outcomes: Not Met   LCSW Treatment Plan for Primary Diagnosis: <principal problem not specified> Long Term Goal(s): Safe transition to appropriate next level of care at discharge, Engage patient in therapeutic group addressing interpersonal concerns.  Short Term Goals: Engage patient in aftercare planning with referrals and resources, Increase social support, Increase emotional regulation, Facilitate acceptance of mental health diagnosis and concerns, Identify triggers associated with mental health/substance abuse issues, and Increase  skills for wellness and recovery  Therapeutic Interventions: Assess for all discharge needs, 1 to 1 time with Social worker, Explore available resources and support systems, Assess for adequacy in community support network, Educate family and significant other(s) on suicide prevention, Complete Psychosocial Assessment, Interpersonal group therapy.  Evaluation of Outcomes: Not Met   Progress in Treatment: Attending groups: Yes. Participating in groups: Yes. Taking medication as prescribed: Yes. Toleration medication: Yes. Family/Significant other contact made: Yes, individual(s) contacted:  Mother  Patient understands diagnosis: No. Discussing patient identified problems/goals with staff: Yes. Medical problems stabilized or resolved: Yes. Denies suicidal/homicidal ideation: Yes. Issues/concerns per patient self-inventory: No.   New problem(s) identified: No, Describe:  None   New Short Term/Long Term Goal(s): medication stabilization, elimination of SI thoughts, development of comprehensive mental wellness plan.   Patient Goals: "To go home"   Discharge Plan or Barriers: Patient recently admitted. CSW will continue to follow and  assess for appropriate referrals and possible discharge planning.   Reason for Continuation of Hospitalization: Delusions  Hallucinations Medication stabilization  Estimated Length of Stay: 3 to 5 days    Scribe for Treatment Team: Darleen Crocker, Latanya Presser 03/06/2021 1:07 PM

## 2021-03-06 NOTE — BHH Group Notes (Signed)
Adult Psychoeducational Group Note  Date:  03/06/2021 Time:  9:24 PM  Group Topic/Focus:  Healthy Communication:   The focus of this group is to discuss communication, barriers to communication, as well as healthy ways to communicate with others.  Participation Level:  Active  Participation Quality:  Appropriate and Attentive  Affect:  Appropriate  Cognitive:  Alert  Insight: Improving  Engagement in Group:  Engaged  Modes of Intervention:  Discussion  Additional Comments  Brandon [name] 03/06/2021, 9:24 PM

## 2021-03-06 NOTE — Progress Notes (Signed)
Pt up requesting something to help him sleep, pt very anxious and talking to people not seen by writer , so pt was given PRN Klonopin per [facility]

## 2021-03-06 NOTE — Progress Notes (Signed)
Recreation Therapy Notes  Date: 8.31.22 Time: 1000 Location: 500 Hall Dayroom  Group Topic: Communication  Goal Area(s) Addresses:  Patient will effectively communicate with peers in group.  Patient will verbalize benefit of healthy communication. Patient will verbalize positive effect of healthy communication on post d/c goals.  Patient will identify communication techniques that made activity effective for group.   Intervention: Blank paper, Pencils  Activity: Three patients took turns describing a picture to the rest of the group.  The group members were to draw the pictures how it's being described by the presenter.  This activity is to test how well patients listen and how well they communicate when expressing themselves.  Education: Communication, Discharge Planning  Education Outcome: Acknowledges understanding/In group clarification offered/Needs additional education.   Clinical Observations/Feedback: Due to there being a COVID case on the unit, group did not occur as usual.  LRT passed out packets that focused on leisure skills. The packet included a secret code, crossword and word search puzzles all dealing with leisure.      Caroll Rancher, LRT/CTRS         Caroll Rancher A 03/06/2021 11:50 AM

## 2021-03-06 NOTE — BHH Group Notes (Signed)
Topic:    Due to the acuity and Covid-19 precautions, group was not held. Patient was provided therapeutic worksheets and asked to meet with CSW as needed.  Konstantinos Cordoba, LCSWA Clinicial Social Worker [facility]  

## 2021-03-07 ENCOUNTER — Ambulatory Visit: Payer: Medicare Other | Admitting: Adult Health

## 2021-03-07 LAB — LIPID PANEL
Cholesterol: 151 mg/dL (ref 0–200)
HDL: 32 mg/dL — ABNORMAL LOW (ref >40)
LDL Cholesterol: 88 mg/dL (ref 0–99)
Total CHOL/HDL Ratio: 4.7 RATIO
Triglycerides: 154 mg/dL — ABNORMAL HIGH (ref <150)
VLDL: 31 mg/dL (ref 0–40)

## 2021-03-07 LAB — GLUCOSE, CAPILLARY
Glucose-Capillary: 126 mg/dL — ABNORMAL HIGH (ref 70–99)
Glucose-Capillary: 146 mg/dL — ABNORMAL HIGH (ref 70–99)
Glucose-Capillary: 112 mg/dL — ABNORMAL HIGH (ref 70–99)

## 2021-03-07 LAB — HEMOGLOBIN A1C
Hgb A1c MFr Bld: 5.5 % (ref 4.8–5.6)
Mean Plasma Glucose: 111.15 mg/dL

## 2021-03-07 MED ORDER — LORAZEPAM 2 MG/ML IJ SOLN
2.0000 mg | Freq: Once | INTRAMUSCULAR | Status: AC
  Administered 2021-03-07: 2 mg via INTRAMUSCULAR
  Filled 2021-03-07: qty 1

## 2021-03-07 MED ORDER — LORAZEPAM 1 MG PO TABS
2.0000 mg | ORAL_TABLET | Freq: Two times a day (BID) | ORAL | Status: DC
  Administered 2021-03-07 – 2021-03-10 (×6): 2 mg via ORAL
  Filled 2021-03-07 (×7): qty 2

## 2021-03-07 MED ORDER — CLOZAPINE 25 MG PO TABS
25.0000 mg | ORAL_TABLET | Freq: Every day | ORAL | Status: DC
  Administered 2021-03-08: 25 mg via ORAL
  Filled 2021-03-07 (×5): qty 1

## 2021-03-07 MED ORDER — ASENAPINE MALEATE 5 MG SL SUBL
5.0000 mg | SUBLINGUAL_TABLET | Freq: Two times a day (BID) | SUBLINGUAL | Status: DC
  Administered 2021-03-07 – 2021-03-11 (×8): 5 mg via SUBLINGUAL
  Filled 2021-03-07 (×14): qty 1

## 2021-03-07 MED ORDER — TRAZODONE HCL 100 MG PO TABS
100.0000 mg | ORAL_TABLET | Freq: Every evening | ORAL | Status: DC | PRN
  Administered 2021-03-08 – 2021-03-13 (×5): 100 mg via ORAL
  Filled 2021-03-07 (×6): qty 1

## 2021-03-07 NOTE — Progress Notes (Addendum)
Attestation   have independently evaluated the patient during a face-to-face assessment on 03/07/21. I reviewed the patient's chart, and I participated in key portions of the service. I discussed the case with the [employer], and I agree with the assessment and plan of care as documented in the House Officer's note.    When I saw pt in AM,  Brandon [name] was seen lying in bed, minimally interactive with team, staring, posturing. Brandon [name] had perseveration of movement as evidenced by hand shaking (continued until asked to stop). Brandon [name] shook my hand when held out even when I asked him not to - overall BFRS was ~10-11 across multiple exams through the day. I evaluated him ~30 mg after 2 mg of lorazepam given and Brandon [name] was sitting up in bed with no evidence of posturing or staring (although still had poor eye contact), had a spontaneous and rational conversation (much more interactive) although still exhibited motor perseveration; BFRS down to 5. We are making medication changes in light of above; notably lowering antipsychotic, removing amantadine (for psychosis although it is effective in some cases of catatonia), and restarting him on a lower dose of clozapine. Had been effective in past with main s/e of drooling per pt and mother, some concern of gait instability which we will monitor. Mom's main concern is difficulty sleeping so clozapine may be a helpful agent along those lines. Will start bowel regimen along with the clozapine. Could not visualize EKG so reordered.      [facility] MD Progress Note  03/07/2021 12:04 PM Brandon [name]  MRN:  [id number] Subjective:  Pt is seen and examined today. Pt is lying in the bed.  When asked to sit for any evaluation, Brandon [name] was not able to sit properly and looked sedated. Pt then moved only his head up and started answering questions in a weird position.  When asked to shake hand Brandon [name] shook hand but was not able to let it go for few seconds.  Brandon [name] responds to questions in short 1-2 words or short  sentences. Brandon [name] started starring at the ceiling not able to have good eye contact with the attending.. Pt states his mood is "Tired". Nursing notes indicate that Pt slept for 6.5 hours. Currently, Pt denies any suicidal ideation, homicidal ideation and, visual and auditory hallucination. When we were trying to leave, Brandon [name] asked "I am I going to go home". Discussed that her just came here are needs some medication adjustment. Brandon [name] said "Ok".  On examination no rigidity noted.  Collateral from [facility] @ [phone]- Mom states his current psychiatry medication regimen had been working well except that Brandon [name] was not able to sleep well (per record review multiple instances of breakthrough psychosis on high doses of multiple antipsychotic agents).  She states that Brandon [name] did not sleep for 3 to 4 days. She states Brandon [name] was having drooling and starring with Haldol so Haldol was stopped and Saphris was started by Dr. Jannifer Franklin. She states that Brandon [name] had been on Clozapin in the past but it must have been stopped by psychiatry due to side effects.  She is not sure about those side effects.  She states gabapentin and trazodone was also stopped by neurology due to unsteadiness.   Called again to discuss the need to start clozapine again, patient gave assent and mom gave consent to start clozapine again. Mom stated that it would have to be adjusted as Brandon [name] is getting infusions for his MS.  Discussed that we can message his  neurologist.  She verbalizes understanding. Informed that Brandon [name] felt better after Ativan.    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HCC) Diagnosis: Principal Problem:   Schizophrenia, disorganized type (HCC)  Total Time spent with patient: 20 minutes  Past Psychiatric History: Schizophrenia -See H&P  Past Medical History:  Past Medical History:  Diagnosis Date   ADHD (attention deficit hyperactivity disorder)    Bipolar 1 disorder (HCC)    Chronic back pain    Chronic constipation     Chronic neck pain    Hypertension    Multiple sclerosis (HCC) 05/20/2013   left sided weakness, dysarthria   Non-compliance    Obesity    Pulmonary embolism (HCC)    Schizophrenia (HCC)    Stroke (HCC)    left sided deficits - pt's mother denies this    Past Surgical History:  Procedure Laterality Date   NO PAST SURGERIES     None     RADIOLOGY WITH ANESTHESIA N/A 01/16/2021   Procedure: MRI WITH ANESTHESIA CERVICAL AND THORASIC SPINE WITH AND WITHOUT CONTRAST;  Surgeon: Radiologist, Medication, MD;  Location: [facility] OR;  Service: Radiology;  Laterality: N/A;   TOOTH EXTRACTION N/A 06/24/2019   Procedure: DENTAL RESTORATION/EXTRACTION OF TEETH NUMBER ONE, SIXTEEN, SEVENTEEN, NINETEEN, THIRTY-TWO;  Surgeon: Ocie Doyne, DDS;  Location: [facility] OR;  Service: Oral Surgery;  Laterality: N/A;   Family History:  Family History  Problem Relation Age of Onset   Diabetes Mother    ADD / ADHD Brother    Family Psychiatric  History: see H&P Social History:  Social History   Substance and Sexual Activity  Alcohol Use Not Currently   Alcohol/week: 0.0 standard drinks   Comment: "A little bit"      Social History   Substance and Sexual Activity  Drug Use Not Currently   Types: Marijuana   Comment: Last used: unknown     Social History   Socioeconomic History   Marital status: Single    Spouse name: Not on file   Number of children: 0   Years of education: 11th   Highest education level: Not on file  Occupational History   Occupation: unemployed    Employer: [employer]    Comment: Disbaled  Tobacco Use   Smoking status: Every Day    Packs/day: 0.25    Types: Cigarettes   Smokeless tobacco: Never   Tobacco comments:    2 cigarettes a day  Vaping Use   Vaping Use: Never used  Substance and Sexual Activity   Alcohol use: Not Currently    Alcohol/week: 0.0 standard drinks    Comment: "A little bit"    Drug use: Not Currently    Types: Marijuana    Comment: Last used:  unknown    Sexual activity: Not on file  Other Topics Concern   Not on file  Social History Narrative   Patient lives at home with his mother.   Disabled.   Education 11 th grade .   Right handed.   Drinks caffeine occassionally   Social Determinants of Corporate investment banker Strain: Not on file  Food Insecurity: Not on file  Transportation Needs: Not on file  Physical Activity: Not on file  Stress: Not on file  Social Connections: Not on file   Additional Social History:                         Sleep: Fair  Appetite:  Fair  Current Medications: Current Facility-Administered Medications  Medication Dose Route Frequency Provider Last Rate Last Admin   acetaminophen (TYLENOL) tablet 650 mg  650 mg Oral Q6H PRN Rankin, Shuvon B, NP       alum & mag hydroxide-simeth (MAALOX/MYLANTA) 200-200-20 MG/5ML suspension 30 mL  30 mL Oral Q4H PRN Rankin, Shuvon B, NP       amantadine (SYMMETREL) capsule 100 mg  100 mg Oral BID Nira Conn A, NP   100 mg at 03/07/21 0927   amLODipine (NORVASC) tablet 5 mg  5 mg Oral Daily Nira Conn A, NP   5 mg at 03/07/21 4235   apixaban (ELIQUIS) tablet 5 mg  5 mg Oral BID Nira Conn A, NP   5 mg at 03/07/21 3614   asenapine (SAPHRIS) sublingual tablet 10 mg  10 mg Sublingual BID Lauro Franklin, MD   10 mg at 03/07/21 0930   clonazePAM (KLONOPIN) tablet 0.5 mg  0.5 mg Oral Daily PRN Nira Conn A, NP   0.5 mg at 03/06/21 2040   docusate sodium (COLACE) capsule 400 mg  400 mg Oral QHS Nira Conn A, NP   400 mg at 03/06/21 2040   latanoprost (XALATAN) 0.005 % ophthalmic solution 1 drop  1 drop Both Eyes QHS Nira Conn A, NP   1 drop at 03/06/21 2038   loratadine (CLARITIN) tablet 10 mg  10 mg Oral Daily Nira Conn A, NP   10 mg at 03/07/21 0927   OLANZapine zydis (ZYPREXA) disintegrating tablet 10 mg  10 mg Oral Q8H PRN Comer Locket, MD       And   LORazepam (ATIVAN) tablet 1 mg  1 mg Oral PRN Comer Locket, MD        And   ziprasidone (GEODON) injection 20 mg  20 mg Intramuscular PRN Mason Jim, Amy E, MD       magnesium hydroxide (MILK OF MAGNESIA) suspension 30 mL  30 mL Oral Daily PRN Rankin, Shuvon B, NP       metFORMIN (GLUCOPHAGE-XR) 24 hr tablet 500 mg  500 mg Oral BID WC Nira Conn A, NP   500 mg at 03/07/21 4315   multivitamin with minerals tablet 1 tablet  1 tablet Oral Daily Nira Conn A, NP   1 tablet at 03/07/21 4008   nicotine (NICODERM CQ - dosed in mg/24 hours) patch 14 mg  14 mg Transdermal Daily Nira Conn A, NP   14 mg at 03/07/21 0925   topiramate (TOPAMAX) tablet 25 mg  25 mg Oral BID Nira Conn A, NP   25 mg at 03/07/21 6761    Lab Results:  Results for orders placed or performed during the hospital encounter of 03/05/21 (from the past 48 hour(s))  Glucose, capillary     Status: Abnormal   Collection Time: 03/06/21  4:40 PM  Result Value Ref Range   Glucose-Capillary 107 (H) 70 - 99 mg/dL    Comment: Glucose reference range applies only to samples taken after fasting for at least 8 hours.  Glucose, capillary     Status: Abnormal   Collection Time: 03/07/21  5:58 AM  Result Value Ref Range   Glucose-Capillary 126 (H) 70 - 99 mg/dL    Comment: Glucose reference range applies only to samples taken after fasting for at least 8 hours.  Lipid panel     Status: Abnormal   Collection Time: 03/07/21  6:31 AM  Result Value Ref Range   Cholesterol 151 0 -  200 mg/dL   Triglycerides 161154 (H) <150 mg/dL   HDL 32 (L) >09>40 mg/dL   Total CHOL/HDL Ratio 4.7 RATIO   VLDL 31 0 - 40 mg/dL   LDL Cholesterol 88 0 - 99 mg/dL    Comment:        Total Cholesterol/HDL:CHD Risk Coronary Heart Disease Risk Table                     Men   Women  1/2 Average Risk   3.4   3.3  Average Risk       5.0   4.4  2 X Average Risk   9.6   7.1  3 X Average Risk  23.4   11.0        Use the calculated Patient Ratio above and the CHD Risk Table to determine the patient's CHD Risk.        ATP III  CLASSIFICATION (LDL):  <100     mg/dL   Optimal  [id number]  mg/dL   Near or Above                    Optimal  130-159  mg/dL   Borderline  [id number]  mg/dL   High  >478>190     mg/dL   Very High Performed at [facility], [address] [place], [place] [address]   Hemoglobin A1c     Status: None   Collection Time: 03/07/21  6:31 AM  Result Value Ref Range   Hgb A1c MFr Bld 5.5 4.8 - 5.6 %    Comment: (NOTE) Pre diabetes:          5.7%-6.4%  Diabetes:              >6.4%  Glycemic control for   <7.0% adults with diabetes    Mean Plasma Glucose 111.15 mg/dL    Comment: Performed at [facility], [address] [place], [place] [address]    Blood Alcohol level:  Lab Results  Component Value Date   [facility] 03/[facility]   ETH <10 09/13/2020    Metabolic Disorder Labs: Lab Results  Component Value Date   HGBA1C 5.5 03/07/2021   MPG 111.15 03/07/2021   MPG 123 04/30/2020   Lab Results  Component Value Date   PROLACTIN 13.6 04/30/2020   Lab Results  Component Value Date   CHOL 151 03/07/2021   TRIG 154 (H) 03/07/2021   HDL 32 (L) 03/07/2021   CHOLHDL 4.7 03/07/2021   VLDL 31 03/07/2021   LDLCALC 88 03/07/2021   LDLCALC 77 04/30/2020    Physical Findings: AIMS:  , ,  ,  ,    CIWA:    COWS:     Musculoskeletal: Strength & Muscle Tone: within normal limits Gait & Station: unable to stand Patient leans: N/A  Psychiatric Specialty Exam:  Presentation  General Appearance: Appropriate for Environment; Disheveled  Eye Contact:Minimal  Speech:Clear and Coherent; Slow  Speech Volume:Decreased  Handedness:Right   Mood and Affect  Mood:Euthymic  Affect:Flat; Blunt   Thought Process  Thought Processes:Disorganized  Descriptions of Associations:Loose  Orientation:Full (Time, Place and Person)  Thought Content:Paranoid Ideation  History of Schizophrenia/Schizoaffective disorder:Yes  Duration of Psychotic Symptoms:Greater than  six months  Hallucinations:Hallucinations: Auditory Description of Auditory Hallucinations: reports hearing voices  Ideas of Reference:Paranoia  Suicidal Thoughts:Suicidal Thoughts: No  Homicidal Thoughts:Homicidal Thoughts: No   Sensorium  Memory:Immediate Poor; Recent Poor  Judgment:Impaired  Insight:Lacking  Executive Functions  Concentration:Poor  Attention Span:Poor  Recall:Poor  [employer] of Knowledge:Poor  Language:Poor   Psychomotor Activity  Psychomotor Activity:Psychomotor Activity: Psychomotor Retardation   Assets  Assets:Resilience   Sleep  Sleep:Sleep: Good    Physical Exam: Physical Exam Vitals and nursing note reviewed.  Constitutional:      General: Brandon [name] is in acute distress.     Appearance: Brandon [name] is not ill-appearing, toxic-appearing or diaphoretic.  HENT:     Head: Normocephalic and atraumatic.  Pulmonary:     Effort: Pulmonary effort is normal.  Neurological:     General: No focal deficit present.     Mental Status: Brandon [name] is oriented to person, place, and time.   Review of Systems  Reason unable to perform ROS: Pt uncooperative.  Blood pressure 104/83, pulse (!) 116, temperature 98.4 F (36.9 C), temperature source Oral, resp. rate 16, height 5\' 11"  (1.803 m), weight (!) 145.2 kg, SpO2 99 %. Body mass index is 44.63 kg/m.   Treatment Plan Summary:Brandon [name] is a 30 yr old male who presents under IVC for acute psychosis. PPHx is significant for Disorganized Schizophrenia and patient has a 37. Pt was disorganized and sedated at admission.    Daily contact with patient to assess and evaluate symptoms and progress in treatment -Reduce Saphris to 5 mg BID.  -Continue Agitation Protocol: Zyprexa/Ativan/Geodon -Stop Amantadine due to current psychosis and fall risk -Stop Klonopin and start Ativan 2 mg twice daily. -Start Clozaril 25 mg QHS for psychosis. - Repeat CBC with diff for baseline.   Catatonia -Patient was given 2 mg  Ativan for catatonia (see attending attestation).  Patient felt better after Ativan. -Start Ativan 2 mg twice daily -Stop Klonopin. - reducing antipsychotic as above (asenapine has high affinity for D2 receptors)  Nicotine Dependence: -Continue Nicotine Patch 14 mg daily   HTN: -Continue Amlodipine 5 mg daily.   H/o PE -Continue Eliquis 5 mg BID   Chronic Constipation:  -Continue Colace 400 mg QHS   Obesity: -Continue Topamax 25 mg BID and continue Metformin 24 hr 500 mg BID as written by PCP   MS/gait disorder: - Reportedly getting Ocrevus by neurology  - OT evaluated him today and recommended Home Health OT.    Other home meds: -Continue Xalatan 0.005% 1 drop both eyes QHS -Continue Claritin 10 mg daily  -DO NOT START TRAZODONE OR GABAPENTIN AS IT WAS STOPPED BY NEUROLOGY DUE TO GAIT INSTABILITY.  -Continue PRN's: Tylenol, Maalox, Atarax, Milk of Magnesia  Marine scientist, MD PGY2  03/07/2021, 12:04 PM

## 2021-03-07 NOTE — Evaluation (Signed)
Occupational Therapy Evaluation Patient Details Name: Brandon [name] MRN: [id number] DOB: [birth year] Today's Date: 03/07/2021    History of Present Illness Brandon [name] is a 30 y/o male with PMHx significant for gait abnormality, ADHD, disorganized schizophrenia, intellectual disability, hypertension, prior stroke with left-sided weakness, MS, and obesity. Pt is currently admitted inpatient to [facility] under IVC for acute psychosis. OT consulted to address functional mobility, gait abnormality, and engagement in ADL/iADLs.   Clinical Impression   Brandon [name] was seen on the unit for OT evaluation to assess functional mobility and ADL/iADL performance. Pt admitted with the above and demonstrates following deficits: impaired cognition, impaired balance, generalized weakness, and decreased activity tolerance. Per chart review, pt has previously seen by both PT and OT, however treatment has been limited due to pt's psychiatric issues/concerns. Prior to admission, pt was modified independent with use of RW for mobility and ADLs. On the unit, pt was able to ambulate x 15 ft back and forth from his bed to the other side of the room without use of AE. Abnormal gait observed, pt dragging R foot and presents with shuffling gait. Pt given RW to address gait, able to ambulate another 15 ft with use of RW and gait appears less shuffled and WFL. Pt reports use of RW when at home. Cognitively, pt presents with difficulty concentrating, difficulty engaging in conversation, OT having to repeat directions x 2. Pt also appears to have periods of zoning out - unable to respond to OT for brief periods of time ~10-20 secs in duration. Unable to identify if cognition is due to current acute psychiatric presentation or pt baseline.  At this time, pt demonstrates behavior that inhibits/restricts participation in occupation and would benefit from skilled OT services to address above deficits. Recommend follow up with HHOT at discharge. OT  will continue to follow while inpatient.   Follow Up Recommendations  Home health OT    Equipment Recommendations  None recommended by OT    Recommendations for Other Services       Precautions / Restrictions Precautions Precautions: Fall Restrictions Weight Bearing Restrictions: No      Mobility Bed Mobility Overal bed mobility: Independent                  Transfers Overall transfer level: Modified independent Equipment used: Rolling walker (2 wheeled)                  Balance Overall balance assessment: Mild deficits observed, not formally tested;History of Falls                                         ADL either performed or assessed with clinical judgement   ADL Overall ADL's : Modified independent;Needs assistance/impaired Eating/Feeding: Independent   Grooming: Modified independent;With adaptive equipment;Sitting   Upper Body Bathing: Modified independent;Set up;With adaptive equipment;With caregiver independent assisting;Sitting   Lower Body Bathing: Modified independent;Set up;Supervison/ safety;With adaptive equipment;Sit to/from stand   Upper Body Dressing : Modified independent;Sitting   Lower Body Dressing: Set up;+2 for safety/equipment;With adaptive equipment;Sit to/from stand;Modified independent   Toilet Transfer: Modified Independent;RW;Grab bars;Comfort height toilet   Toileting- Clothing Manipulation and Hygiene: Modified independent;With adaptive equipment;Sit to/from stand   Tub/ Shower Transfer: Set up;Modified independent;Stand-pivot;Shower seat;Grab bars;Rolling walker;3 in 1   Functional mobility during ADLs: Modified independent;Rolling walker General ADL Comments: Pt able to ambulate x15 ft with use of RW;  no LOB observed. Improved gait noted with RW vs no use of AE     Vision Baseline Vision/History: 1 Wears glasses       Perception     Praxis      Pertinent Vitals/Pain Pain Assessment:  No/denies pain     Hand Dominance Right   Extremity/Trunk Assessment             Communication Communication Communication: Expressive difficulties (difficult to understand at times, appears confused, slurred/soft speech)   Cognition Arousal/Alertness: Lethargic Behavior During Therapy: Flat affect Overall Cognitive Status: Impaired/Different from baseline Area of Impairment: Attention;Memory;Following commands;Safety/judgement                   Current Attention Level: Alternating;Selective   Following Commands: Follows one step commands inconsistently Safety/Judgement: Decreased awareness of deficits;Decreased awareness of safety         General Comments       Exercises     Shoulder Instructions      Home Living Family/patient expects to be discharged to:: Private residence Living Arrangements: Parent Available Help at Discharge: Family;Available 24 hours/day                         Home Equipment: Walker - 2 wheels          Prior Functioning/Environment Level of Independence: Independent with assistive device(s)        Comments: Pt reports ambulating at home with use of RW and sometimes get support/help from Mom        OT Problem List: Decreased strength;Decreased coordination;Decreased cognition;Decreased activity tolerance;Decreased safety awareness;Obesity      OT Treatment/Interventions: Self-care/ADL training;DME and/or AE instruction;Cognitive remediation/compensation    OT Goals(Current goals can be found in the care plan section) Acute Rehab OT Goals OT Goal Formulation: Patient unable to participate in goal setting Time For Goal Achievement: 03/21/21 Potential to Achieve Goals: Fair  OT Frequency: Min 2X/week   Barriers to D/C:            Co-evaluation              AM-PAC OT "6 Clicks" Daily Activity     Outcome Measure Help from another person eating meals?: None Help from another person taking care of  personal grooming?: A Little Help from another person toileting, which includes using toliet, bedpan, or urinal?: A Little Help from another person bathing (including washing, rinsing, drying)?: A Lot Help from another person to put on and taking off regular upper body clothing?: A Little Help from another person to put on and taking off regular lower body clothing?: A Little 6 Click Score: 18   End of Session Equipment Utilized During Treatment: Rolling walker Nurse Communication: Mobility status  Activity Tolerance: Patient limited by fatigue Patient left: in bed  OT Visit Diagnosis: Unsteadiness on feet (R26.81);Other abnormalities of gait and mobility (R26.89);Muscle weakness (generalized) (M62.81);History of falling (Z91.81);Other symptoms and signs involving cognitive function                Time: 6270-3500 OT Time Calculation (min): 25 min Charges:  OT General Charges $OT Visit: 1 Visit OT Evaluation $OT Eval Low Complexity: 1 Low OT Treatments $Self Care/Home Management : 8-22 mins  03/07/2021  Donne Hazel, MOT, OTR/L   [place] Teleshia Lemere 03/07/2021, 2:06 PM

## 2021-03-07 NOTE — Progress Notes (Signed)
   03/06/21 2100  Psych Admission Type (Psych Patients Only)  Admission Status Voluntary  Psychosocial Assessment  Patient Complaints Suspiciousness  Eye Contact Brief  Facial Expression Flat  Affect Flat  Speech Slow  Interaction Minimal  Motor Activity Slow;Unsteady  Appearance/Hygiene Unremarkable  Behavior Characteristics Cooperative;Guarded  Mood Suspicious;Preoccupied  Aggressive Behavior  Effect No apparent injury  Thought Process  Coherency Blocking  Content Delusions  Delusions Paranoid  Perception Hallucinations  Hallucination None reported or observed  Judgment Limited  Confusion Mild  Danger to Self  Current suicidal ideation? Denies  Danger to Others  Danger to Others None reported or observed  Dar Note: Patient presents with a flat affect and depressed mood.  Denies suicidal thought, auditory and visual hallucinations.  Medications given as prescribed.  Safety checks maintained.  Patient is safe on the unit.

## 2021-03-07 NOTE — Group Note (Addendum)
Recreation Therapy Group Note   Group Topic:Healthy Decision Making  Group Date: 03/07/2021 Start Time: 1000 End Time:  Facilitators: Bjorn Loser, NT Location: [address] Dayroom   Group Description: Patients were given a scenario that they were going to be stranded on a deserted [place] for several months before being rescued. Writer tasked them with making a list of 15 things they would choose to bring with them for "survival". The list of items was prioritized most important to least. Each patient would come up with their own list, then work together to create a new list of 15 items while in a group of 3-5 peers. LRT discussed each person's list and how it differed from others. The debrief included discussion of priorities, good decisions versus bad decisions, and how it is important to think before acting so we can make the best decision possible. LRT tied the concept of effective communication among group members to patient's support systems outside of the hospital and its benefit post discharge.     Affect/Mood: N/A   Participation Level: Pt did not attend group.    Clinical Observations/Individualized Feedback:  Group did not occur as scheduled due to COVID being on the hall, packets were given dealing with coping skills.  The packet consisted of identifying coping skills for triggers, coping skills for various emotions, word search and crossword puzzle.  Plan: Continue to engage patient in RT group sessions 2-3x/week.   Caroll Rancher, LRT/CTRS 03/07/2021 11:17 AM
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
## 2021-03-07 NOTE — Group Note (Signed)
Occupational Therapy Group Note  Group Topic:Coping Skills  Group Date: 03/07/2021 Start Time: 1400 End Time: 1445 Facilitators: Estiven Kohan, OT   Group Description: Group encouraged increased engagement and participation through discussion and activity focused on "Coping Ahead." Patients were split up into teams and selected a card from a stack of positive coping strategies. Patients were instructed to act out/charade the coping skill for other peers to guess and receive points for their team. Discussion followed with a focus on identifying additional positive coping strategies and patients shared how they were going to cope ahead over the weekend while continuing hospitalization stay.  Therapeutic Goal(s): Identify positive vs negative coping strategies. Identify coping skills to be used during hospitalization vs coping skills outside of hospital/at home Increase participation in therapeutic group environment and promote engagement in treatment   Participation Level: OT Group cancelled d/t COVID+ case on the unit and current COVID precautions in place. Will continue to monitor status and engage patients in OT group at next scheduled time.    Plan: Continue to engage patient in OT groups 2 - 3x/week.  03/07/2021  Kasandra Fehr, OT   

## 2021-03-07 NOTE — [facility] Group Notes (Signed)
Adult Psychoeducational Group Note  Date:  03/07/2021 Time:  9:19 PM  Group Topic/Focus:  Conflict Resolution:   The focus of this group is to discuss the conflict resolution process and how it may be used upon discharge.  Participation Level:  Minimal  Participation Quality:  Attentive  Affect:  Appropriate  Cognitive:  Alert  Insight: Improving  Engagement in Group:  Improving  Modes of Intervention:  Discussion  Additional Comments  Brandon [name] 03/07/2021, 9:19 PM

## 2021-03-07 NOTE — Progress Notes (Signed)
Pt was given Saphris and Ativan @ 1749 , pt lethargic at this time, pt continues to appear to be responding to internal stimuli at times with thought blocking.      03/07/21 2100  Psych Admission Type (Psych Patients Only)  Admission Status Voluntary  Psychosocial Assessment  Patient Complaints Suspiciousness  Eye Contact Brief  Facial Expression Flat  Affect Flat  Speech Slow  Interaction Minimal  Motor Activity Slow;Unsteady  Appearance/Hygiene Unremarkable  Behavior Characteristics Cooperative;Guarded  Mood Suspicious;Preoccupied  Aggressive Behavior  Effect No apparent injury  Thought Process  Coherency Blocking  Content Delusions  Delusions Paranoid  Perception Hallucinations  Hallucination None reported or observed  Judgment Limited  Confusion Mild  Danger to Self  Current suicidal ideation? Denies  Danger to Others  Danger to Others None reported or observed

## 2021-03-08 LAB — CBC WITH DIFFERENTIAL/PLATELET
Abs Immature Granulocytes: 0.07 10*3/uL (ref 0.00–0.07)
Basophils Absolute: 0 10*3/uL (ref 0.0–0.1)
Basophils Relative: 1 %
Eosinophils Absolute: 0.2 10*3/uL (ref 0.0–0.5)
Eosinophils Relative: 2 %
HCT: 47.1 % (ref 39.0–52.0)
Hemoglobin: 14.7 g/dL (ref 13.0–17.0)
Immature Granulocytes: 1 %
Lymphocytes Relative: 23 %
Lymphs Abs: 1.7 10*3/uL (ref 0.7–4.0)
MCH: 25.9 pg — ABNORMAL LOW (ref 26.0–34.0)
MCHC: 31.2 g/dL (ref 30.0–36.0)
MCV: 82.9 fL (ref 80.0–100.0)
Monocytes Absolute: 0.4 10*3/uL (ref 0.1–1.0)
Monocytes Relative: 6 %
Neutro Abs: 4.9 10*3/uL (ref 1.7–7.7)
Neutrophils Relative %: 67 %
Platelets: 234 10*3/uL (ref 150–400)
RBC: 5.68 MIL/uL (ref 4.22–5.81)
RDW: 15.2 % (ref 11.5–15.5)
WBC: 7.2 10*3/uL (ref 4.0–10.5)
nRBC: 0 % (ref 0.0–0.2)

## 2021-03-08 LAB — RESP PANEL BY RT-PCR (FLU A&B, COVID) ARPGX2
Influenza A by PCR: NEGATIVE
Influenza B by PCR: NEGATIVE
SARS Coronavirus 2 by RT PCR: NEGATIVE

## 2021-03-08 LAB — GLUCOSE, CAPILLARY
Glucose-Capillary: 115 mg/dL — ABNORMAL HIGH (ref 70–99)
Glucose-Capillary: 100 mg/dL — ABNORMAL HIGH (ref 70–99)
Glucose-Capillary: 191 mg/dL — ABNORMAL HIGH (ref 70–99)

## 2021-03-08 MED ORDER — CLOZAPINE 25 MG PO TABS
50.0000 mg | ORAL_TABLET | Freq: Every day | ORAL | Status: DC
  Administered 2021-03-09 – 2021-03-10 (×2): 50 mg via ORAL
  Filled 2021-03-08 (×4): qty 2

## 2021-03-08 NOTE — [facility] Group Notes (Signed)
[facility] LCSW Group Therapy   03/08/2021 11:20 AM    Type of Therapy and Topic:  Group Therapy:  Strengths Exploration   Participation Level: Did Not Attend  Description of Group: This group allows individuals to explore their strengths, learn to use strengths in new ways to improve well-being. Strengths-based interventions involve identifying strengths, understanding how they are used, and learning new ways to apply them. Individuals will identify their strengths, and then explore their roles in different areas of life (relationships, professional life, and personal fulfillment). Individuals will think about ways in which they currently use their strengths, along with new ways they could begin using them.    Therapeutic Goals Patient will verbalize two of their strengths Patient will identify how their strengths are currently used Patient will identify two new ways to apply their strengths  Patients will create a plan to apply their strengths in their daily lives     Summary of Patient Progress:  Did not attend       Therapeutic Modalities Cognitive Behavioral Therapy Motivational Interviewing

## 2021-03-08 NOTE — Plan of Care (Signed)
Patient has been active in the milieu. Alert and oriented. Had episodes of hallucinations and was seen talking to himself. Denied suicidal thoughts. No sign of  distress.

## 2021-03-08 NOTE — Progress Notes (Signed)
Recreation Therapy Notes  Date: 9.2.22 Time: 1000 Location: [address]   Group Topic: Goal Setting  Goal Area(s) Addresses:  Patient will participate in discussion of what a goal is. Patient will successfully complete sheet on goa planning.  Intervention: Worksheet  Activity: LRT completed one on one sessions with patients on the hall.  LRT spoke with pts about what goals are and what the acronym SMART stands for; specific, measurable, attainable, relevant, and time-bound. Pt were given a worksheet to fill out that focused on planning goals within a week, month, year and five years.  Pts were to also address what would prevent them from reaching their goals, what they need to help reach goals and what can they do immediately to work towards goals.  Education: Following directions, Education on Goal Setting  Education Outcome: Acknowledges education  Clinical Observations/Feedback: Pt did not participate.    Caroll Rancher, LRT/CTRS     Caroll Rancher A 03/08/2021 12:03 PM

## 2021-03-08 NOTE — Progress Notes (Signed)
Pt did not attend 1:1 orientation/goals group. 

## 2021-03-08 NOTE — [facility] Group Notes (Signed)
[facility] Group Notes:  (Nursing/MHT/Case Management/Adjunct)  Date:  03/08/2021  Time:  1:49 PM  Type of Therapy:  Nurse Education  Participation Level:  Minimal  Participation Quality:  Appropriate  Affect:  Appropriate  Cognitive:  Alert  Insight:  Appropriate  Engagement in Group:  Supportive  Modes of Intervention:  Discussion and Education  Summary of Progress/Problems:  Discussed medication management and adherence.  Patient was attentive and receptive.  Brandon [name] 03/08/2021, 1:49 PM

## 2021-03-08 NOTE — Progress Notes (Addendum)
Attending attestation  Patient looks much better today. Thought process is still bizarre/disorganized but he is interacting with team in a reality-based fashion. He is somewhat goofy (making jokes during rounds, shadowboxing in front of nurses) but has not been aggressive or agitated. Appears internally preoccupied which is improving.  No side effects noted from clozapine treatment, pre-treating constipation - will pend increases over weekend. Low threshold to start swish and swallow or miralax. Likely decrease Saprhis Monday. Currently balancing treatment of psychosis with catatonia and gait instability from MS.   Labs reviewed, high complexity, expanded physical exam (see BFRS).   [facility] MD Progress Note  03/08/2021 4:24 PM GRAE CANNATA  MRN:  [id number]  Subjective:  Pt is seen and examined today. Pt is sitting on bed.  Patient seen in late AM by Dr. Gasper Sells and Leone Haven. He denied mood, anxiety, AH/VH/etc.   He was significantly more able to engage in conversation although appeared internally preoccupied for 10-15 seconds once or twice during visit - even joked briefly with examiner. He performed much better on BFRS - no posturing noted. Still somewhat perseverative in movement, score 5 for immobility, poor eye contact and posturing. When asked to shake hand, Pt pulled the attending towards him and starting laughing (not aggressive, clearly joking in context). Skipped pen through tongue d/t clinical improvement and distressing to pt. Specifically denying drooling and constipation, generally denied other sx in ROS. Spoke to mother/guardian around 1 to inform of improvement and Dr. Anne Hahn (neurologist's) agreement with starting clozapine.   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HCC) Diagnosis: Principal Problem:   Schizophrenia, disorganized type (HCC)  Total Time spent with patient: 20 minutes   Past Psychiatric History: Schizophrenia -See H&P  Past Medical History:  Past  Medical History:  Diagnosis Date   ADHD (attention deficit hyperactivity disorder)    Bipolar 1 disorder (HCC)    Chronic back pain    Chronic constipation    Chronic neck pain    Hypertension    Multiple sclerosis (HCC) 05/20/2013   left sided weakness, dysarthria   Non-compliance    Obesity    Pulmonary embolism (HCC)    Schizophrenia (HCC)    Stroke (HCC)    left sided deficits - pt's mother denies this    Past Surgical History:  Procedure Laterality Date   NO PAST SURGERIES     None     RADIOLOGY WITH ANESTHESIA N/A 01/16/2021   Procedure: MRI WITH ANESTHESIA CERVICAL AND THORASIC SPINE WITH AND WITHOUT CONTRAST;  Surgeon: Radiologist, Medication, MD;  Location: [facility] OR;  Service: Radiology;  Laterality: N/A;   TOOTH EXTRACTION N/A 06/24/2019   Procedure: DENTAL RESTORATION/EXTRACTION OF TEETH NUMBER ONE, SIXTEEN, SEVENTEEN, NINETEEN, THIRTY-TWO;  Surgeon: Ocie Doyne, DDS;  Location: [facility] OR;  Service: Oral Surgery;  Laterality: N/A;   Family History:  Family History  Problem Relation Age of Onset   Diabetes Mother    ADD / ADHD Brother    Family Psychiatric  History: see H&P Social History:  Social History   Substance and Sexual Activity  Alcohol Use Not Currently   Alcohol/week: 0.0 standard drinks   Comment: "A little bit"      Social History   Substance and Sexual Activity  Drug Use Not Currently   Types: Marijuana   Comment: Last used: unknown     Social History   Socioeconomic History   Marital status: Single    Spouse name: Not on file   Number of children: 0  Years of education: 11th   Highest education level: Not on file  Occupational History   Occupation: unemployed    Employer: [employer]    Comment: Disbaled  Tobacco Use   Smoking status: Every Day    Packs/day: 0.25    Types: Cigarettes   Smokeless tobacco: Never   Tobacco comments:    2 cigarettes a day  Vaping Use   Vaping Use: Never used  Substance and Sexual Activity    Alcohol use: Not Currently    Alcohol/week: 0.0 standard drinks    Comment: "A little bit"    Drug use: Not Currently    Types: Marijuana    Comment: Last used: unknown    Sexual activity: Not on file  Other Topics Concern   Not on file  Social History Narrative   Patient lives at home with his mother.   Disabled.   Education 11 th grade .   Right handed.   Drinks caffeine occassionally   Social Determinants of Corporate investment banker Strain: Not on file  Food Insecurity: Not on file  Transportation Needs: Not on file  Physical Activity: Not on file  Stress: Not on file  Social Connections: Not on file   Additional Social History:                         Sleep: Good  Appetite:  Fair  Current Medications: Current Facility-Administered Medications  Medication Dose Route Frequency Provider Last Rate Last Admin   acetaminophen (TYLENOL) tablet 650 mg  650 mg Oral Q6H PRN Rankin, Shuvon B, NP       alum & mag hydroxide-simeth (MAALOX/MYLANTA) 200-200-20 MG/5ML suspension 30 mL  30 mL Oral Q4H PRN Rankin, Shuvon B, NP       amLODipine (NORVASC) tablet 5 mg  5 mg Oral Daily Nira Conn A, NP   5 mg at 03/08/21 0824   apixaban (ELIQUIS) tablet 5 mg  5 mg Oral BID Nira Conn A, NP   5 mg at 03/08/21 0825   asenapine (SAPHRIS) sublingual tablet 5 mg  5 mg Sublingual BID Karsten Ro, MD   5 mg at 03/08/21 0825   cloZAPine (CLOZARIL) tablet 25 mg  25 mg Oral QHS Doda, Traci Sermon, MD       docusate sodium (COLACE) capsule 400 mg  400 mg Oral QHS Nira Conn A, NP   400 mg at 03/06/21 2040   latanoprost (XALATAN) 0.005 % ophthalmic solution 1 drop  1 drop Both Eyes QHS Nira Conn A, NP   1 drop at 03/06/21 2038   loratadine (CLARITIN) tablet 10 mg  10 mg Oral Daily Nira Conn A, NP   10 mg at 03/08/21 0825   OLANZapine zydis (ZYPREXA) disintegrating tablet 10 mg  10 mg Oral Q8H PRN Comer Locket, MD       And   LORazepam (ATIVAN) tablet 1 mg  1 mg Oral PRN  Comer Locket, MD       And   ziprasidone (GEODON) injection 20 mg  20 mg Intramuscular PRN Comer Locket, MD       LORazepam (ATIVAN) tablet 2 mg  2 mg Oral BID Karsten Ro, MD   2 mg at 03/08/21 0826   magnesium hydroxide (MILK OF MAGNESIA) suspension 30 mL  30 mL Oral Daily PRN Rankin, Shuvon B, NP       metFORMIN (GLUCOPHAGE-XR) 24 hr tablet 500 mg  500 mg  Oral BID WC Nira Conn A, NP   500 mg at 03/08/21 4782   multivitamin with minerals tablet 1 tablet  1 tablet Oral Daily Nira Conn A, NP   1 tablet at 03/08/21 9562   nicotine (NICODERM CQ - dosed in mg/24 hours) patch 14 mg  14 mg Transdermal Daily Nira Conn A, NP   14 mg at 03/08/21 0824   topiramate (TOPAMAX) tablet 25 mg  25 mg Oral BID Nira Conn A, NP   25 mg at 03/08/21 0825   traZODone (DESYREL) tablet 100 mg  100 mg Oral QHS PRN Jaclyn Shaggy, PA-C        Lab Results:  Results for orders placed or performed during the hospital encounter of 03/05/21 (from the past 48 hour(s))  Glucose, capillary     Status: Abnormal   Collection Time: 03/06/21  4:40 PM  Result Value Ref Range   Glucose-Capillary 107 (H) 70 - 99 mg/dL    Comment: Glucose reference range applies only to samples taken after fasting for at least 8 hours.  Glucose, capillary     Status: Abnormal   Collection Time: 03/07/21  5:58 AM  Result Value Ref Range   Glucose-Capillary 126 (H) 70 - 99 mg/dL    Comment: Glucose reference range applies only to samples taken after fasting for at least 8 hours.  Lipid panel     Status: Abnormal   Collection Time: 03/07/21  6:31 AM  Result Value Ref Range   Cholesterol 151 0 - 200 mg/dL   Triglycerides 130 (H) <150 mg/dL   HDL 32 (L) >86 mg/dL   Total CHOL/HDL Ratio 4.7 RATIO   VLDL 31 0 - 40 mg/dL   LDL Cholesterol 88 0 - 99 mg/dL    Comment:        Total Cholesterol/HDL:CHD Risk Coronary Heart Disease Risk Table                     Men   Women  1/2 Average Risk   3.4   3.3  Average Risk       5.0    4.4  2 X Average Risk   9.6   7.1  3 X Average Risk  23.4   11.0        Use the calculated Patient Ratio above and the CHD Risk Table to determine the patient's CHD Risk.        ATP III CLASSIFICATION (LDL):  <100     mg/dL   Optimal  578-469  mg/dL   Near or Above                    Optimal  130-159  mg/dL   Borderline  629-528  mg/dL   High  >413     mg/dL   Very High Performed at [facility], [address], [place], [place] [address]   Hemoglobin A1c     Status: None   Collection Time: 03/07/21  6:31 AM  Result Value Ref Range   Hgb A1c MFr Bld 5.5 4.8 - 5.6 %    Comment: (NOTE) Pre diabetes:          5.7%-6.4%  Diabetes:              >6.4%  Glycemic control for   <7.0% adults with diabetes    Mean Plasma Glucose 111.15 mg/dL    Comment: Performed at [facility], [address] [place], [place] [address]  Glucose, capillary     Status: Abnormal   Collection Time: 03/07/21 12:04 PM  Result Value Ref Range   Glucose-Capillary 146 (H) 70 - 99 mg/dL    Comment: Glucose reference range applies only to samples taken after fasting for at least 8 hours.  Glucose, capillary     Status: Abnormal   Collection Time: 03/07/21  5:23 PM  Result Value Ref Range   Glucose-Capillary 112 (H) 70 - 99 mg/dL    Comment: Glucose reference range applies only to samples taken after fasting for at least 8 hours.  Glucose, capillary     Status: Abnormal   Collection Time: 03/08/21  5:54 AM  Result Value Ref Range   Glucose-Capillary 115 (H) 70 - 99 mg/dL    Comment: Glucose reference range applies only to samples taken after fasting for at least 8 hours.  CBC with Differential/Platelet     Status: Abnormal   Collection Time: 03/08/21  6:28 AM  Result Value Ref Range   WBC 7.2 4.0 - 10.5 K/uL   RBC 5.68 4.22 - 5.81 MIL/uL   Hemoglobin 14.7 13.0 - 17.0 g/dL   HCT 78.447.1 69.639.0 - 29.552.0 %   MCV 82.9 80.0 - 100.0 fL   MCH 25.9 (L) 26.0 - 34.0 pg   MCHC 31.2 30.0 -  36.0 g/dL   RDW 28.415.2 13.211.5 - 44.015.5 %   Platelets 234 150 - 400 K/uL   nRBC 0.0 0.0 - 0.2 %   Neutrophils Relative % 67 %   Neutro Abs 4.9 1.7 - 7.7 K/uL   Lymphocytes Relative 23 %   Lymphs Abs 1.7 0.7 - 4.0 K/uL   Monocytes Relative 6 %   Monocytes Absolute 0.4 0.1 - 1.0 K/uL   Eosinophils Relative 2 %   Eosinophils Absolute 0.2 0.0 - 0.5 K/uL   Basophils Relative 1 %   Basophils Absolute 0.0 0.0 - 0.1 K/uL   Immature Granulocytes 1 %   Abs Immature Granulocytes 0.07 0.00 - 0.07 K/uL    Comment: Performed at [facility], [address] [place], [place] [address]  Resp Panel by RT-PCR (Flu A&B, Covid) Nasopharyngeal Swab     Status: None   Collection Time: 03/08/21 10:55 AM   Specimen: Nasopharyngeal Swab; Nasopharyngeal(NP) swabs in vial transport medium  Result Value Ref Range   SARS Coronavirus 2 by RT PCR NEGATIVE NEGATIVE    Comment: (NOTE) SARS-CoV-2 target nucleic acids are NOT DETECTED.  The SARS-CoV-2 RNA is generally detectable in upper respiratory specimens during the acute phase of infection. The lowest concentration of SARS-CoV-2 viral copies this assay can detect is 138 copies/mL. A negative result does not preclude SARS-Cov-2 infection and should not be used as the sole basis for treatment or other patient management decisions. A negative result may occur with  improper specimen collection/handling, submission of specimen other than nasopharyngeal swab, presence of viral mutation(s) within the areas targeted by this assay, and inadequate number of viral copies(<138 copies/mL). A negative result must be combined with clinical observations, patient history, and epidemiological information. The expected result is Negative.  Fact Sheet for Patients:  BloggerCourse.comhttps://www.fda.gov/media/152166/download  Fact Sheet for Healthcare Providers:  SeriousBroker.ithttps://www.fda.gov/media/152162/download  This test is no t yet approved or cleared by the [place] FDA and   has been authorized for detection and/or diagnosis of SARS-CoV-2 by FDA under an Emergency Use Authorization (EUA). This EUA will remain  in effect (meaning this test can be used)  for the duration of the COVID-19 declaration under Section 564(b)(1) of the Act, 21 U.S.C.section 360bbb-3(b)(1), unless the authorization is terminated  or revoked sooner.       Influenza A by PCR NEGATIVE NEGATIVE   Influenza B by PCR NEGATIVE NEGATIVE    Comment: (NOTE) The Xpert Xpress SARS-CoV-2/FLU/RSV plus assay is intended as an aid in the diagnosis of influenza from Nasopharyngeal swab specimens and should not be used as a sole basis for treatment. Nasal washings and aspirates are unacceptable for Xpert Xpress SARS-CoV-2/FLU/RSV testing.  Fact Sheet for Patients: BloggerCourse.com  Fact Sheet for Healthcare Providers: SeriousBroker.it  This test is not yet approved or cleared by the [place] FDA and has been authorized for detection and/or diagnosis of SARS-CoV-2 by FDA under an Emergency Use Authorization (EUA). This EUA will remain in effect (meaning this test can be used) for the duration of the COVID-19 declaration under Section 564(b)(1) of the Act, 21 U.S.C. section 360bbb-3(b)(1), unless the authorization is terminated or revoked.  Performed at [facility], [address] [place], [place] [address]   Glucose, capillary     Status: Abnormal   Collection Time: 03/08/21 11:54 AM  Result Value Ref Range   Glucose-Capillary 100 (H) 70 - 99 mg/dL    Comment: Glucose reference range applies only to samples taken after fasting for at least 8 hours.    Blood Alcohol level:  Lab Results  Component Value Date   ETH <10 03/04/2021   ETH <10 09/13/2020    Metabolic Disorder Labs: Lab Results  Component Value Date   HGBA1C 5.5 03/07/2021   MPG 111.15 03/07/2021   MPG 123 04/30/2020   Lab Results  Component  Value Date   PROLACTIN 13.6 04/30/2020   Lab Results  Component Value Date   CHOL 151 03/07/2021   TRIG 154 (H) 03/07/2021   HDL 32 (L) 03/07/2021   CHOLHDL 4.7 03/07/2021   VLDL 31 03/07/2021   LDLCALC 88 03/07/2021   LDLCALC 77 04/30/2020    Physical Findings: AIMS:  , ,  ,  ,    CIWA:    COWS:     Musculoskeletal: Strength & Muscle Tone: within normal limits Gait & Station:  Deferred Patient leans: N/A  Psychiatric Specialty Exam:  Presentation  General Appearance: Appropriate for Environment  Eye Contact:Fair  Speech:Clear and Coherent; Slow  Speech Volume:Decreased  Handedness:Right   Mood and Affect  Mood:Euthymic  Affect:Blunt; Flat   Thought Process  Thought Processes:Disorganized  Descriptions of Associations:Tangential (Pt answered question in short sentences and in "yes" or "no")  Orientation:Other (comment) (Pt goes tangential when asked orientation questions)  Thought Content:Tangential  History of Schizophrenia/Schizoaffective disorder:Yes  Duration of Psychotic Symptoms:Greater than six months  Hallucinations:Hallucinations: Auditory  Ideas of Reference:None  Suicidal Thoughts:Suicidal Thoughts: No  Homicidal Thoughts:Homicidal Thoughts: No   Sensorium  Memory:Immediate Poor; Recent Poor  Judgment:Impaired  Insight:Lacking   Executive Functions  Concentration:Poor  Attention Span:Poor  Recall:Poor  Fund of Knowledge:Poor  Language:Poor   Psychomotor Activity  Psychomotor Activity:Psychomotor Activity: Psychomotor Retardation   Assets  Assets:Resilience   Sleep  Sleep:Sleep: Good Number of Hours of Sleep: 10.25 Physical Exam: Physical Exam Vitals and nursing note reviewed.  Constitutional:      General: He is not in acute distress.    Appearance: He is not ill-appearing, toxic-appearing or diaphoretic.  HENT:     Head: Normocephalic and atraumatic.  Pulmonary:     Effort: Pulmonary effort is  normal.  Neurological:     General: No focal deficit present.     Mental Status: He is oriented to person, place, and time.   Review of Systems  Constitutional:  Negative for chills and fever.  Respiratory:  Negative for cough and shortness of breath.   Cardiovascular:  Negative for chest pain.  Gastrointestinal:  Negative for nausea and vomiting.  Neurological:  Negative for dizziness and headaches.  Psychiatric/Behavioral:  Negative for depression, hallucinations and suicidal ideas.   Blood pressure (!) 128/108, pulse 79, temperature 98.6 F (37 C), temperature source Oral, resp. rate 16, height 5\' 11"  (1.803 m), weight (!) 145.2 kg, SpO2 99 %. Body mass index is 44.63 kg/m.   Treatment Plan Summary:Mr. Mclennan is a 30 yr old male who presents under IVC for acute psychosis in setting of treatment resistance (mother verifies pill swallows daily). Had been on 2 antipsychotics (last haldol LAI dispensed early August, saphris 10 BID). Previously on clozapine with good response, stopped d/t drooling per pt. Oral consent from guardian and assent from pt to restart. 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 Pt was disorganized and sedated at admission.  Patient showed some signs of catatonia yesterday and felt better with Ativan. Risk of treating residual sx > benefit in high falls risk pt. Unclear how much of residual sx is catatonia vs MS sequelae. Patient was checked for COVID-19 today due to being high risk.  Patient was negative for COVID-19 but moved to [address] hall due to high risk of getting COVID.   Daily contact with patient to assess and evaluate symptoms and progress in treatment -Continue Saphris 5 mg BID.  -Continue Agitation Protocol: Zyprexa/Ativan/Geodon -Continue Ativan 2 mg twice daily. -Continue Clozaril 25 mg QHS for psychosis.  Increase to 50 mg pended for 9/3.  -Repeat CBC wnl, wbc 7.2  Catatonia  No signs of catatonia today.   -Continue Ativan 2 mg twice daily -Saphris was reduced yesterday (asenapine has high affinity for D2 receptors) - topamax (incidentally on for wt loss) also tx catatonia  Nicotine Dependence: -Continue Nicotine Patch 14 mg daily   HTN: -Continue Amlodipine 5 mg daily.   H/o PE -Continue Eliquis 5 mg BID   Chronic Constipation:  -Continue Colace 400 mg QHS   Obesity: -Continue Topamax 25 mg BID and continue Metformin 24 hr 500 mg BID as written by PCP   MS/gait disorder: - Reportedly getting Ocrevus by neurology  - Home Health OT at D/c.    Other home meds: -Continue Xalatan 0.005% 1 drop both eyes QHS -Continue Claritin 10 mg daily  -DO NOT START TRAZODONE OR GABAPENTIN AS IT WAS STOPPED BY NEUROLOGY DUE TO GAIT INSTABILITY.  -Continue PRN's: Tylenol, Maalox, Atarax, Milk of Magnesia  11/3, MD PGY2  03/08/2021, 4:24 PM

## 2021-03-09 DIAGNOSIS — F201 Disorganized schizophrenia: Secondary | ICD-10-CM | POA: Diagnosis not present

## 2021-03-09 LAB — GLUCOSE, CAPILLARY
Glucose-Capillary: 91 mg/dL (ref 70–99)
Glucose-Capillary: 100 mg/dL — ABNORMAL HIGH (ref 70–99)
Glucose-Capillary: 142 mg/dL — ABNORMAL HIGH (ref 70–99)

## 2021-03-09 LAB — C-REACTIVE PROTEIN: CRP: 1.9 mg/dL — ABNORMAL HIGH (ref <1.0)

## 2021-03-09 LAB — TROPONIN I (HIGH SENSITIVITY): Troponin I (High Sensitivity): 4 ng/L (ref <18)

## 2021-03-09 NOTE — Progress Notes (Signed)
   03/09/21 1200  Psych Admission Type (Psych Patients Only)  Admission Status Voluntary  Psychosocial Assessment  Patient Complaints Anxiety  Eye Contact Fair  Facial Expression Anxious  Affect Preoccupied  Speech Slow;Soft  Interaction Other (Comment) (appropriate)  Motor Activity Slow;Unsteady  Appearance/Hygiene Unremarkable  Behavior Characteristics Cooperative  Mood Anxious;Preoccupied  Aggressive Behavior  Effect No apparent injury  Thought Process  Coherency Circumstantial  Content Delusions  Delusions Paranoid  Perception Hallucinations  Hallucination None reported or observed  Judgment Poor  Confusion Mild  Danger to Self  Current suicidal ideation? Denies  Danger to Others  Danger to Others None reported or observed

## 2021-03-09 NOTE — [facility] Group Notes (Signed)
Adult Psychoeducational Group Note  Date:  03/09/2021 Time:  12:40 PM  Group Topic/Focus:  Goals Group:   The focus of this group is to help patients establish daily goals to achieve during treatment and discuss how the patient can incorporate goal setting into their daily lives to aide in recovery.  Participation Level:  Active  Participation Quality:  Attentive  Affect:  Appropriate  Cognitive:  Appropriate  Insight: Appropriate  Engagement in Group:  Engaged  Modes of Intervention:  Discussion  Additional Comments:  Patient attended goals group today, stayed appropriate and attentive the duration of the group. The patient's goal was to listen to music.  Sindia Kowalczyk T Gemini Beaumier 03/09/2021, 12:40 PM

## 2021-03-09 NOTE — [facility] Suicide Risk Assessment (Signed)
[facility] INPATIENT:  Family/Significant Other Suicide Prevention Education  Suicide Prevention Education:  Education Completed; legal guardian mother, Yuchen Fedor, [phone],  (name of family member/significant other) has been identified by the patient as the family member/significant other with whom the patient will be residing, and identified as the person(s) who will aid the patient in the event of a mental health crisis (suicidal ideations/suicide attempt).  With written consent from the patient, the family member/significant other has been provided the following suicide prevention education, prior to the and/or following the discharge of the patient.  The suicide prevention education provided includes the following: Suicide risk factors Suicide prevention and interventions National Suicide Hotline telephone number [facility] assessment telephone number [facility] Emergency Assistance 911 [facility] and/or Residential Mobile Crisis Unit telephone number  Request made of family/significant other to: Remove weapons (e.g., guns, rifles, knives), all items previously/currently identified as safety concern.   Remove drugs/medications (over-the-counter, prescriptions, illicit drugs), all items previously/currently identified as a safety concern.  The family member/significant other verbalizes understanding of the suicide prevention education information provided.  The family member/significant other agrees to remove the items of safety concern listed above.  Mother who is legal guardian states that she is unable to keep caring for patient who not only has Schizophrenia but also M.S.  She herself is paraplegic and cares for him from a wheelchair, states she just cannot keep doing it.  She stated that he does have a tendency to wander and has fallen previously which then kept him bedridden for several months, so he needs to be in a facility that can keep him safe from those  types of issues.  She is working with [facility], and CSW advised her to call that individual 3 times weekly to keep him actively searching.  CSW asked if it is going to be possible for the patient to come home while awaiting placement -- her response was not definitive, as it vacillated from "of course, that's my child" to "I can't do it anymore - of course he can come here on weekends."  Mother stated she has familiarity with suicide prevention, did not need the review listed above.  Carloyn Jaeger Grossman-Orr 03/09/2021, 12:02 PM

## 2021-03-09 NOTE — [facility] Group Notes (Signed)
Relaxation group-music therapy 

## 2021-03-09 NOTE — Group Note (Signed)
Clinical Social Work Note  No group could be held today due to a COVID-19 outbreak on the unit.  An educational packet about anger management was provided to the patient to work on individually.  Ambrose Mantle, LCSW 03/09/2021, 8:58 AM

## 2021-03-09 NOTE — [facility] Group Notes (Signed)
Patient received material for group today.  

## 2021-03-09 NOTE — Progress Notes (Addendum)
[facility] MD Progress Note  I have independently evaluated the patient during a face-to-face assessment on 03/09/21. I reviewed the patient's chart, and I participated in key portions of the service. I discussed the case with the [employer], and I agree with the assessment and plan of care as documented in the House Officer's note.   On my assessment patient is stable and cooperative for exam. He denies AVH, paranoia, or first rank symptoms and states his new medications are helping compared to previous medication trials. He denies SI or HI. He voices no physical complaints. Will continue to monitor with dose titration of Clozaril.   Bartholomew Crews, MD, FAPA   03/09/2021 3:33 PM Brandon [name]  MRN:  [id number]  Subjective:  ON patient required a dose of PRN Ativan PO to help with sleep. Per RN patient was noted to appear a bit agitated by was willing to take medicine as he felt he restless. On assessment this AM patient reports that he could not sleep last night and reported there was "noise outside of my room." Patient reports that once he took Ativan he no longer had any AVH. Patient denies AVH ideas of reference, first rank symptoms, or paranoia, but provider later notes patient responding to internal stimuli and unable to respond to provider while talking to himself at this time. Patient denied SI and HI. Patient reported that his goal is to go home and to do this he will try to "not have any outburst or conflicts." Provider explained to patient that increase in his Clozaril will also likely help him sleep better tonight. Patient endorses that he does need help getting to sleep and hopes that Clozaril helps him. Patient reports that he had a BM this AM and does not endorse any negative side effects from his change in medication. Patient reports he is eating well and continues to try to ambulate with out his walker.   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HCC) Diagnosis: Principal  Problem:   Schizophrenia, disorganized type (HCC)  Total Time spent with patient: 15 minutes  Past Psychiatric History: See H&P  Past Medical History:  Past Medical History:  Diagnosis Date   ADHD (attention deficit hyperactivity disorder)    Bipolar 1 disorder (HCC)    Chronic back pain    Chronic constipation    Chronic neck pain    Hypertension    Multiple sclerosis (HCC) 05/20/2013   left sided weakness, dysarthria   Non-compliance    Obesity    Pulmonary embolism (HCC)    Schizophrenia (HCC)    Stroke (HCC)    left sided deficits - pt's mother denies this    Past Surgical History:  Procedure Laterality Date   NO PAST SURGERIES     None     RADIOLOGY WITH ANESTHESIA N/A 01/16/2021   Procedure: MRI WITH ANESTHESIA CERVICAL AND THORASIC SPINE WITH AND WITHOUT CONTRAST;  Surgeon: Radiologist, Medication, MD;  Location: [facility] OR;  Service: Radiology;  Laterality: N/A;   TOOTH EXTRACTION N/A 06/24/2019   Procedure: DENTAL RESTORATION/EXTRACTION OF TEETH NUMBER ONE, SIXTEEN, SEVENTEEN, NINETEEN, THIRTY-TWO;  Surgeon: Ocie Doyne, DDS;  Location: [facility] OR;  Service: Oral Surgery;  Laterality: N/A;   Family History:  Family History  Problem Relation Age of Onset   Diabetes Mother    ADD / ADHD Brother    Family Psychiatric  History: See H&P Social History:  Social History   Substance and Sexual Activity  Alcohol Use Not Currently   Alcohol/week: 0.0  standard drinks   Comment: "A little bit"      Social History   Substance and Sexual Activity  Drug Use Not Currently   Types: Marijuana   Comment: Last used: unknown     Social History   Socioeconomic History   Marital status: Single    Spouse name: Not on file   Number of children: 0   Years of education: 11th   Highest education level: Not on file  Occupational History   Occupation: unemployed    Employer: [employer]    Comment: Disbaled  Tobacco Use   Smoking status: Every Day    Packs/day: 0.25     Types: Cigarettes   Smokeless tobacco: Never   Tobacco comments:    2 cigarettes a day  Vaping Use   Vaping Use: Never used  Substance and Sexual Activity   Alcohol use: Not Currently    Alcohol/week: 0.0 standard drinks    Comment: "A little bit"    Drug use: Not Currently    Types: Marijuana    Comment: Last used: unknown    Sexual activity: Not on file  Other Topics Concern   Not on file  Social History Narrative   Patient lives at home with his mother.   Disabled.   Education 11 th grade .   Right handed.   Drinks caffeine occassionally   Social Determinants of Corporate investment banker Strain: Not on file  Food Insecurity: Not on file  Transportation Needs: Not on file  Physical Activity: Not on file  Stress: Not on file  Social Connections: Not on file    Sleep: Fair  Appetite:  Good  Current Medications: Current Facility-Administered Medications  Medication Dose Route Frequency Provider Last Rate Last Admin   acetaminophen (TYLENOL) tablet 650 mg  650 mg Oral Q6H PRN Rankin, Shuvon B, NP       alum & mag hydroxide-simeth (MAALOX/MYLANTA) 200-200-20 MG/5ML suspension 30 mL  30 mL Oral Q4H PRN Rankin, Shuvon B, NP       amLODipine (NORVASC) tablet 5 mg  5 mg Oral Daily Nira Conn A, NP   5 mg at 03/09/21 1610   apixaban (ELIQUIS) tablet 5 mg  5 mg Oral BID Nira Conn A, NP   5 mg at 03/09/21 0832   asenapine (SAPHRIS) sublingual tablet 5 mg  5 mg Sublingual BID Karsten Ro, MD   5 mg at 03/09/21 9604   cloZAPine (CLOZARIL) tablet 25 mg  25 mg Oral QHS Cinderella, Margaret A   25 mg at 03/08/21 2047   cloZAPine (CLOZARIL) tablet 50 mg  50 mg Oral QHS Cinderella, Margaret A       docusate sodium (COLACE) capsule 400 mg  400 mg Oral QHS Nira Conn A, NP   400 mg at 03/08/21 2046   latanoprost (XALATAN) 0.005 % ophthalmic solution 1 drop  1 drop Both Eyes QHS Nira Conn A, NP   1 drop at 03/08/21 2100   loratadine (CLARITIN) tablet 10 mg  10 mg Oral Daily  Nira Conn A, NP   10 mg at 03/09/21 5409   LORazepam (ATIVAN) tablet 2 mg  2 mg Oral BID Karsten Ro, MD   2 mg at 03/09/21 8119   magnesium hydroxide (MILK OF MAGNESIA) suspension 30 mL  30 mL Oral Daily PRN Rankin, Shuvon B, NP       metFORMIN (GLUCOPHAGE-XR) 24 hr tablet 500 mg  500 mg Oral BID WC Jackelyn Poling,  NP   500 mg at 03/09/21 1610   multivitamin with minerals tablet 1 tablet  1 tablet Oral Daily Nira Conn A, NP   1 tablet at 03/09/21 9604   nicotine (NICODERM CQ - dosed in mg/24 hours) patch 14 mg  14 mg Transdermal Daily Nira Conn A, NP   14 mg at 03/09/21 0832   OLANZapine zydis (ZYPREXA) disintegrating tablet 10 mg  10 mg Oral Q8H PRN Comer Locket, MD       And   ziprasidone (GEODON) injection 20 mg  20 mg Intramuscular PRN Comer Locket, MD       topiramate (TOPAMAX) tablet 25 mg  25 mg Oral BID Nira Conn A, NP   25 mg at 03/09/21 5409   traZODone (DESYREL) tablet 100 mg  100 mg Oral QHS PRN Jaclyn Shaggy, PA-C   100 mg at 03/08/21 2047    Lab Results:  Results for orders placed or performed during the hospital encounter of 03/05/21 (from the past 48 hour(s))  Glucose, capillary     Status: Abnormal   Collection Time: 03/07/21  5:23 PM  Result Value Ref Range   Glucose-Capillary 112 (H) 70 - 99 mg/dL    Comment: Glucose reference range applies only to samples taken after fasting for at least 8 hours.  Glucose, capillary     Status: Abnormal   Collection Time: 03/08/21  5:54 AM  Result Value Ref Range   Glucose-Capillary 115 (H) 70 - 99 mg/dL    Comment: Glucose reference range applies only to samples taken after fasting for at least 8 hours.  CBC with Differential/Platelet     Status: Abnormal   Collection Time: 03/08/21  6:28 AM  Result Value Ref Range   WBC 7.2 4.0 - 10.5 K/uL   RBC 5.68 4.22 - 5.81 MIL/uL   Hemoglobin 14.7 13.0 - 17.0 g/dL   HCT 81.1 91.4 - 78.2 %   MCV 82.9 80.0 - 100.0 fL   MCH 25.9 (L) 26.0 - 34.0 pg   MCHC 31.2 30.0 -  36.0 g/dL   RDW 95.6 21.3 - 08.6 %   Platelets 234 150 - 400 K/uL   nRBC 0.0 0.0 - 0.2 %   Neutrophils Relative % 67 %   Neutro Abs 4.9 1.7 - 7.7 K/uL   Lymphocytes Relative 23 %   Lymphs Abs 1.7 0.7 - 4.0 K/uL   Monocytes Relative 6 %   Monocytes Absolute 0.4 0.1 - 1.0 K/uL   Eosinophils Relative 2 %   Eosinophils Absolute 0.2 0.0 - 0.5 K/uL   Basophils Relative 1 %   Basophils Absolute 0.0 0.0 - 0.1 K/uL   Immature Granulocytes 1 %   Abs Immature Granulocytes 0.07 0.00 - 0.07 K/uL    Comment: Performed at [facility], [address], [place], [place] [address]  Resp Panel by RT-PCR (Flu A&B, Covid) Nasopharyngeal Swab     Status: None   Collection Time: 03/08/21 10:55 AM   Specimen: Nasopharyngeal Swab; Nasopharyngeal(NP) swabs in vial transport medium  Result Value Ref Range   SARS Coronavirus 2 by RT PCR NEGATIVE NEGATIVE    Comment: (NOTE) SARS-CoV-2 target nucleic acids are NOT DETECTED.  The SARS-CoV-2 RNA is generally detectable in upper respiratory specimens during the acute phase of infection. The lowest concentration of SARS-CoV-2 viral copies this assay can detect is 138 copies/mL. A negative result does not preclude SARS-Cov-2 infection and should not be used as the sole basis  for treatment or other patient management decisions. A negative result may occur with  improper specimen collection/handling, submission of specimen other than nasopharyngeal swab, presence of viral mutation(s) within the areas targeted by this assay, and inadequate number of viral copies(<138 copies/mL). A negative result must be combined with clinical observations, patient history, and epidemiological information. The expected result is Negative.  Fact Sheet for Patients:  BloggerCourse.com  Fact Sheet for Healthcare Providers:  SeriousBroker.it  This test is no t yet approved or cleared by the [place] FDA and   has been authorized for detection and/or diagnosis of SARS-CoV-2 by FDA under an Emergency Use Authorization (EUA). This EUA will remain  in effect (meaning this test can be used) for the duration of the COVID-19 declaration under Section 564(b)(1) of the Act, 21 U.S.C.section 360bbb-3(b)(1), unless the authorization is terminated  or revoked sooner.       Influenza A by PCR NEGATIVE NEGATIVE   Influenza B by PCR NEGATIVE NEGATIVE    Comment: (NOTE) The Xpert Xpress SARS-CoV-2/FLU/RSV plus assay is intended as an aid in the diagnosis of influenza from Nasopharyngeal swab specimens and should not be used as a sole basis for treatment. Nasal washings and aspirates are unacceptable for Xpert Xpress SARS-CoV-2/FLU/RSV testing.  Fact Sheet for Patients: BloggerCourse.com  Fact Sheet for Healthcare Providers: SeriousBroker.it  This test is not yet approved or cleared by the [place] FDA and has been authorized for detection and/or diagnosis of SARS-CoV-2 by FDA under an Emergency Use Authorization (EUA). This EUA will remain in effect (meaning this test can be used) for the duration of the COVID-19 declaration under Section 564(b)(1) of the Act, 21 U.S.C. section 360bbb-3(b)(1), unless the authorization is terminated or revoked.  Performed at [facility], [address], [place], [place] [address]   Glucose, capillary     Status: Abnormal   Collection Time: 03/08/21 11:54 AM  Result Value Ref Range   Glucose-Capillary 100 (H) 70 - 99 mg/dL    Comment: Glucose reference range applies only to samples taken after fasting for at least 8 hours.  Glucose, capillary     Status: Abnormal   Collection Time: 03/08/21  8:53 PM  Result Value Ref Range   Glucose-Capillary 191 (H) 70 - 99 mg/dL    Comment: Glucose reference range applies only to samples taken after fasting for at least 8 hours.  Glucose, capillary      Status: None   Collection Time: 03/09/21  6:51 AM  Result Value Ref Range   Glucose-Capillary 91 70 - 99 mg/dL    Comment: Glucose reference range applies only to samples taken after fasting for at least 8 hours.  C-reactive protein     Status: Abnormal   Collection Time: 03/09/21  7:03 AM  Result Value Ref Range   CRP 1.9 (H) <1.0 mg/dL    Comment: Performed at [facility], [address] [place], [place] [address]  Troponin I (High Sensitivity)     Status: None   Collection Time: 03/09/21  7:03 AM  Result Value Ref Range   Troponin I (High Sensitivity) 4 <18 ng/L    Comment: (NOTE) Elevated high sensitivity troponin I (hsTnI) values and significant  changes across serial measurements may suggest ACS but many other  chronic and acute conditions are known to elevate hsTnI results.  Refer to the "Links" section for chest pain algorithms and additional  guidance. Performed at [facility], [address]  [place], [place] [address]   Glucose, capillary     Status: Abnormal   Collection Time: 03/09/21 11:33 AM  Result Value Ref Range   Glucose-Capillary 100 (H) 70 - 99 mg/dL    Comment: Glucose reference range applies only to samples taken after fasting for at least 8 hours.    Blood Alcohol level:  Lab Results  Component Value Date   ETH <10 03/04/2021   ETH <10 09/13/2020    Metabolic Disorder Labs: Lab Results  Component Value Date   HGBA1C 5.5 03/07/2021   MPG 111.15 03/07/2021   MPG 123 04/30/2020   Lab Results  Component Value Date   PROLACTIN 13.6 04/30/2020   Lab Results  Component Value Date   CHOL 151 03/07/2021   TRIG 154 (H) 03/07/2021   HDL 32 (L) 03/07/2021   CHOLHDL 4.7 03/07/2021   VLDL 31 03/07/2021   LDLCALC 88 03/07/2021   LDLCALC 77 04/30/2020    Musculoskeletal: Strength & Muscle Tone: increased Gait & Station: broad based Patient leans: N/A  Psychiatric Specialty Exam:  Presentation  General  Appearance: Appropriate for Environment  Eye Contact:Good  Speech:Slow; Slurred  Speech Volume:Decreased  Handedness:Right   Mood and Affect  Mood:Aloof  Affect:Blunt   Thought Process  Thought Processes:superficially goal directed and more linear  Descriptions of Associations:Intact  Orientation:Full (Time, Place and Person)  Thought Content:Denies AVH, paranoia, delusions, ideas of reference or first rank symptoms - but seen responding to internal stimuli on exam  History of Schizophrenia/Schizoaffective disorder:Yes  Duration of Psychotic Symptoms:Greater than six months  Hallucinations:Denied but internally preoccupied at times on exam  Ideas of Reference:None  Suicidal Thoughts:Suicidal Thoughts: No  Homicidal Thoughts:Homicidal Thoughts: No   Sensorium  Memory:Poor  Judgment:Impaired  Insight:Shallow   Executive Functions  Concentration:Fair  Attention Span:Fair  Recall:Poor  Fund of Knowledge:Poor  Language:Fair   Psychomotor Activity  Psychomotor Activity:Psychomotor Activity: Wide Based Gait (walks without walker, has a step like turn, drags left leg)   Assets  Assets:Desire for Improvement; Resilience; Social Support; Housing   Sleep  Sleep: 6 hours  Physical Exam: Physical Exam Constitutional:      Appearance: He is obese.  HENT:     Head: Normocephalic and atraumatic.     Comments: Left sided droop of mouth. Mouth appears restricted when patient talks. No tremor or tongue or lips Pulmonary:     Effort: Pulmonary effort is normal.  Neurological:     Mental Status: He is alert.     Comments: Step-like turn and drags left leg when walks. Also keeps R am bent at angle when walking and does not appear to be able to actively hold weight on the arm and has limited active ROM. This appears to be chronic as patient is not bothered and accomodates unconsciously for decreased use of this arm.   Review of Systems  Constitutional:   Negative for chills and fever.  HENT:  Negative for hearing loss.   Eyes:  Negative for blurred vision.  Respiratory:  Negative for cough and wheezing.   Cardiovascular:  Negative for chest pain.  Gastrointestinal:  Negative for abdominal pain.  Neurological:  Negative for dizziness and tremors.  Psychiatric/Behavioral:  Negative for suicidal ideas. The patient has insomnia.   Blood pressure (!) 128/108, pulse 79, temperature 98.6 F (37 C), temperature source Oral, resp. rate 16, height 5\' 11"  (1.803 m), weight (!) 145.2 kg, SpO2 99 %. Body mass index is 44.63 kg/m.   Treatment Plan Summary: Daily contact  with patient to assess and evaluate symptoms and progress in treatment and Medication management [name] is a 30 yr old male who presents under IVC for acute psychosis in setting of treatment resistance (mother verifies pill swallows daily). Had been on 2 antipsychotics (last haldol LAI dispensed early August, saphris 10 TID). Previously on clozapine with good response, stopped d/t drooling per pt. Oral consent from guardian and assent from pt to restart. 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 Pt was disorganized and sedated at admission. Patient symptoms of catatonia remain absent today and patient continues to improve. Physical abnormalities may be related to his MS, unclear at this time.   Schizophrenia, disorganized -Continue Saphris 5 mg BID - goal of discontinuing as Clozaril becomes therapeutic -Continue Agitation Protocol: Zyprexa/Ativan/Geodon -Continue Ativan 2 mg twice daily for recent catatonic symptoms -Increase Clozaril to 50 mg QHS   Catatonia  No signs of catatonia today.  -Continue Ativan 2 mg twice daily - topamax (incidentally on for wt loss) also tx catatonia   Nicotine Dependence: -Continue Nicotine Patch 14 mg daily   HTN: -Continue Amlodipine 5 mg daily.   H/o PE -Continue Eliquis 5 mg BID   Chronic  Constipation:  -Continue Colace 400 mg QHS   Obesity: -Continue Topamax 25 mg BID and continue Metformin 24 hr 500 mg BID as written by PCP   MS/gait disorder: - Reportedly getting Ocrevus by neurology  - Home Health OT at D/c.    Other home meds: -Continue Xalatan 0.005% 1 drop both eyes QHS -Continue Claritin 10 mg daily  -DO NOT START TRAZODONE OR GABAPENTIN AS IT WAS STOPPED BY NEUROLOGY DUE TO GAIT INSTABILITY.  -Continue PRN's: Tylenol, Maalox, Atarax, Milk of Magnesia   PGY-2 Brandon MortonJai B McQuilla, MD 03/09/2021, 3:33 PM

## 2021-03-09 NOTE — Progress Notes (Signed)
   03/09/21 0000  Psych Admission Type (Psych Patients Only)  Admission Status Voluntary  Psychosocial Assessment  Patient Complaints Agitation  Eye Contact Fair  Facial Expression Anxious  Affect Preoccupied  Speech Slow;Loud;Soft  Motor Activity Slow;Unsteady  Appearance/Hygiene Unremarkable  Behavior Characteristics Appropriate to situation  Mood Anxious;Preoccupied  Thought Process  Coherency Circumstantial  Content Delusions  Delusions Paranoid  Perception Hallucinations  Hallucination None reported or observed  Judgment Poor  Confusion Mild  Danger to Self  Current suicidal ideation? Denies  Danger to Others  Danger to Others None reported or observed

## 2021-03-10 DIAGNOSIS — F201 Disorganized schizophrenia: Secondary | ICD-10-CM | POA: Diagnosis not present

## 2021-03-10 LAB — GLUCOSE, CAPILLARY
Glucose-Capillary: 121 mg/dL — ABNORMAL HIGH (ref 70–99)
Glucose-Capillary: 142 mg/dL — ABNORMAL HIGH (ref 70–99)
Glucose-Capillary: 113 mg/dL — ABNORMAL HIGH (ref 70–99)

## 2021-03-10 MED ORDER — LORAZEPAM 1 MG PO TABS
1.0000 mg | ORAL_TABLET | Freq: Two times a day (BID) | ORAL | Status: DC
  Administered 2021-03-10 – 2021-03-22 (×24): 1 mg via ORAL
  Filled 2021-03-10 (×26): qty 1

## 2021-03-10 NOTE — [facility] Group Notes (Signed)
Patient receive material for group this morning 

## 2021-03-10 NOTE — Progress Notes (Signed)
Patient stayed in his room this morning, appeared weak and unable to come to the medication window. Confused and preoccupied. Medications taken to him in room. Patient has poor hygiene and has been urinating on himself. Refusing to shower. MD was updated on patient' condition. Safety precautions reinforced.

## 2021-03-10 NOTE — Progress Notes (Addendum)
[facility]  03/10/2021 11:50 AM Brandon LawrenceRaymond S Wing  MRN:  [id number]  Chief Complaint: Psychosis  Subjective:  ON patient was documented by staff to continue to respond to internal stimuli. Patient however, has had no behavioral outbursts or reported issues with others on the unit or staff. Patient reports that he slept "ok" last night and only required PRN trazodone to help with sleep. No other PRNS given. Patient was documented to have enuresis this AM, and per attending patient was more difficult to arouse this morning. Patient denied feeling worse today, and endorsed that he continues to eat well and had a BM this AM. Patient continued to deny SI, HI, and AVH when asked by provider. Patient reports he continues to wish to go home, but will continue to try to not have any behavior disturbances while on the unit.  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HCC) Diagnosis: Principal Problem:   Schizophrenia, disorganized type (HCC)  Total Time Spent in Direct Patient Care:  I personally spent 25 minutes on the unit in direct patient care. The direct patient care time included face-to-face time with the patient, reviewing the patient's chart, communicating with other professionals, and coordinating care. Greater than 50% of this time was spent in counseling or coordinating care with the patient regarding goals of hospitalization, psycho-education, and discharge planning needs.  Past Psychiatric History: See H&P  Past Medical History:  Past Medical History:  Diagnosis Date   ADHD (attention deficit hyperactivity disorder)    Bipolar 1 disorder (HCC)    Chronic back pain    Chronic constipation    Chronic neck pain    Hypertension    Multiple sclerosis (HCC) 05/20/2013   left sided weakness, dysarthria   Non-compliance    Obesity    Pulmonary embolism (HCC)    Schizophrenia (HCC)    Stroke (HCC)    left sided deficits - pt's mother denies this    Past Surgical  History:  Procedure Laterality Date   NO PAST SURGERIES     None     RADIOLOGY WITH ANESTHESIA N/A 01/16/2021   Procedure: MRI WITH ANESTHESIA CERVICAL AND THORASIC SPINE WITH AND WITHOUT CONTRAST;  Surgeon: Radiologist, Medication, MD;  Location: [facility] OR;  Service: Radiology;  Laterality: N/A;   TOOTH EXTRACTION N/A 06/24/2019   Procedure: DENTAL RESTORATION/EXTRACTION OF TEETH NUMBER ONE, SIXTEEN, SEVENTEEN, NINETEEN, THIRTY-TWO;  Surgeon: Ocie DoyneJensen, Scott, DDS;  Location: [facility] OR;  Service: Oral Surgery;  Laterality: N/A;   Family History:  Family History  Problem Relation Age of Onset   Diabetes Mother    ADD / ADHD Brother    Family Psychiatric  History: See H&P Social History:  Social History   Substance and Sexual Activity  Alcohol Use Not Currently   Alcohol/week: 0.0 standard drinks   Comment: "A little bit"      Social History   Substance and Sexual Activity  Drug Use Not Currently   Types: Marijuana   Comment: Last used: unknown     Social History   Socioeconomic History   Marital status: Single    Spouse name: Not on file   Number of children: 0   Years of education: 11th   Highest education level: Not on file  Occupational History   Occupation: unemployed    Employer: Teacher, [employer]    Comment: Disbaled  Tobacco Use   Smoking status: Every Day    Packs/day: 0.25    Types: Cigarettes   Smokeless tobacco: Never  Tobacco comments:    2 cigarettes a day  Vaping Use   Vaping Use: Never used  Substance and Sexual Activity   Alcohol use: Not Currently    Alcohol/week: 0.0 standard drinks    Comment: "A little bit"    Drug use: Not Currently    Types: Marijuana    Comment: Last used: unknown    Sexual activity: Not on file  Other Topics Concern   Not on file  Social History Narrative   Patient lives at home with his mother.   Disabled.   Education 11 th grade .   Right handed.   Drinks caffeine occassionally   Social Determinants of Manufacturing engineer Strain: Not on file  Food Insecurity: Not on file  Transportation Needs: Not on file  Physical Activity: Not on file  Stress: Not on file  Social Connections: Not on file    Sleep: Fair  Appetite:  Good  Current Medications: Current Facility-Administered Medications  Medication Dose Route Frequency Provider Last Rate Last Admin   acetaminophen (TYLENOL) tablet 650 mg  650 mg Oral Q6H PRN Rankin, Shuvon B, NP       alum & mag hydroxide-simeth (MAALOX/MYLANTA) 200-200-20 MG/5ML suspension 30 mL  30 mL Oral Q4H PRN Rankin, Shuvon B, NP       amLODipine (NORVASC) tablet 5 mg  5 mg Oral Daily Nira Conn A, NP   5 mg at 03/10/21 0923   apixaban (ELIQUIS) tablet 5 mg  5 mg Oral BID Nira Conn A, NP   5 mg at 03/10/21 0923   asenapine (SAPHRIS) sublingual tablet 5 mg  5 mg Sublingual BID Karsten Ro, MD   5 mg at 03/10/21 0924   cloZAPine (CLOZARIL) tablet 50 mg  50 mg Oral QHS Cinderella, Margaret A   50 mg at 03/09/21 2042   docusate sodium (COLACE) capsule 400 mg  400 mg Oral QHS Nira Conn A, NP   400 mg at 03/09/21 2041   latanoprost (XALATAN) 0.005 % ophthalmic solution 1 drop  1 drop Both Eyes QHS Nira Conn A, NP   1 drop at 03/08/21 2100   loratadine (CLARITIN) tablet 10 mg  10 mg Oral Daily Nira Conn A, NP   10 mg at 03/10/21 0925   LORazepam (ATIVAN) tablet 1 mg  1 mg Oral BID Eliseo Gum B, MD       magnesium hydroxide (MILK OF MAGNESIA) suspension 30 mL  30 mL Oral Daily PRN Rankin, Shuvon B, NP       metFORMIN (GLUCOPHAGE-XR) 24 hr tablet 500 mg  500 mg Oral BID WC Nira Conn A, NP   500 mg at 03/10/21 5361   multivitamin with minerals tablet 1 tablet  1 tablet Oral Daily Nira Conn A, NP   1 tablet at 03/10/21 0925   nicotine (NICODERM CQ - dosed in mg/24 hours) patch 14 mg  14 mg Transdermal Daily Nira Conn A, NP   14 mg at 03/10/21 0926   OLANZapine zydis (ZYPREXA) disintegrating tablet 10 mg  10 mg Oral Q8H PRN Comer Locket, MD        And   ziprasidone (GEODON) injection 20 mg  20 mg Intramuscular PRN Comer Locket, MD       topiramate (TOPAMAX) tablet 25 mg  25 mg Oral BID Nira Conn A, NP   25 mg at 03/10/21 4431   traZODone (DESYREL) tablet 100 mg  100 mg Oral QHS PRN Melbourne Abts  W, PA-C   100 mg at 03/09/21 2057    Lab Results:  Results for orders placed or performed during the hospital encounter of 03/05/21 (from the past 48 hour(s))  Glucose, capillary     Status: Abnormal   Collection Time: 03/08/21 11:54 AM  Result Value Ref Range   Glucose-Capillary 100 (H) 70 - 99 mg/dL    Comment: Glucose reference range applies only to samples taken after fasting for at least 8 hours.  Glucose, capillary     Status: Abnormal   Collection Time: 03/08/21  8:53 PM  Result Value Ref Range   Glucose-Capillary 191 (H) 70 - 99 mg/dL    Comment: Glucose reference range applies only to samples taken after fasting for at least 8 hours.  Glucose, capillary     Status: None   Collection Time: 03/09/21  6:51 AM  Result Value Ref Range   Glucose-Capillary 91 70 - 99 mg/dL    Comment: Glucose reference range applies only to samples taken after fasting for at least 8 hours.  C-reactive protein     Status: Abnormal   Collection Time: 03/09/21  7:03 AM  Result Value Ref Range   CRP 1.9 (H) <1.0 mg/dL    Comment: Performed at [facility], [address] [place], [place] [address]  Troponin I (High Sensitivity)     Status: None   Collection Time: 03/09/21  7:03 AM  Result Value Ref Range   Troponin I (High Sensitivity) 4 <18 ng/L    Comment: (NOTE) Elevated high sensitivity troponin I (hsTnI) values and significant  changes across serial measurements may suggest ACS but many other  chronic and acute conditions are known to elevate hsTnI results.  Refer to the "Links" section for chest pain algorithms and additional  guidance. Performed at [facility], [address], [place], [place] [address]   Glucose, capillary     Status: Abnormal   Collection Time: 03/09/21 11:33 AM  Result Value Ref Range   Glucose-Capillary 100 (H) 70 - 99 mg/dL    Comment: Glucose reference range applies only to samples taken after fasting for at least 8 hours.  Glucose, capillary     Status: Abnormal   Collection Time: 03/09/21  4:53 PM  Result Value Ref Range   Glucose-Capillary 142 (H) 70 - 99 mg/dL    Comment: Glucose reference range applies only to samples taken after fasting for at least 8 hours.  Glucose, capillary     Status: Abnormal   Collection Time: 03/10/21  6:44 AM  Result Value Ref Range   Glucose-Capillary 121 (H) 70 - 99 mg/dL    Comment: Glucose reference range applies only to samples taken after fasting for at least 8 hours.    Blood Alcohol level:  Lab Results  Component Value Date   [facility] <10 03/04/2021   ETH <10 09/13/2020    Metabolic Disorder Labs: Lab Results  Component Value Date   HGBA1C 5.5 03/07/2021   MPG 111.15 03/07/2021   MPG 123 04/30/2020   Lab Results  Component Value Date   PROLACTIN 13.6 04/30/2020   Lab Results  Component Value Date   CHOL 151 03/07/2021   TRIG 154 (H) 03/07/2021   HDL 32 (L) 03/07/2021   CHOLHDL 4.7 03/07/2021   VLDL 31 03/07/2021   LDLCALC 88 03/07/2021   LDLCALC 77 04/30/2020    Physical Findings:  Musculoskeletal: Strength & Muscle Tone: decreased Gait & Station: broad based Patient leans:  Backward  Psychiatric Specialty Exam:  Presentation  General Appearance: Appropriate for Environment  Eye Contact:Fair  Speech:Slow dysarthric, mumbling  Speech Volume:Decreased  Handedness:Right   Mood and Affect  Mood:Aloof, sedated  Affect:Blunt   Thought Process  Thought Processes:Linear (remains concrete)  Descriptions of Associations:Intact   Orientation:Full (Time, Place and Person)  Thought Content:Denies AVH and would not cooperate to answer questions about ideas of  reference, first rank symptoms, paranoia - no delusional comments noted; is not grossly responding to internal stimuli on exam  History of Schizophrenia/Schizoaffective disorder:Yes  Duration of Psychotic Symptoms:Greater than six months  Hallucinations:Hallucinations: None Description of Auditory Hallucinations: noted by staff to still respond to internal stimuli  Ideas of Reference:None  Suicidal Thoughts:Suicidal Thoughts: No  Homicidal Thoughts:Homicidal Thoughts: No   Sensorium  Memory:Immediate Fair; Recent Poor; Remote Poor  Judgment:Fair - compliant with meds  Insight:lacking   Executive Functions  Concentration:Fair  Attention Span:Fair  Recall:Untested today  Fund of Knowledge:Poor  Language:Poor   Psychomotor Activity  Psychomotor Activity:Psychomotor Activity: Wide Based Gait; Decreased   Assets  Assets:Desire for Improvement; Resilience; Social Support   Sleep  Sleep:7.5 hours    Physical Exam: Physical Exam Constitutional:      Appearance: He is obese.  HENT:     Head: Normocephalic and atraumatic.     Comments: L sided lip droops/ restricted when patient smiles Pulmonary:     Effort: Pulmonary effort is normal.  Musculoskeletal:     Comments: Decrease ROM in L arm. Patient also appears a bit weak on his R leg today and more unsteady overall. No tremor noted.  Skin:    General: Skin is dry.  Neurological:     Mental Status: He is alert and oriented to person, place, and time.   Review of Systems  Constitutional:  Negative for fever.  HENT:  Negative for sore throat.   Respiratory:  Negative for cough.   Cardiovascular:  Negative for chest pain.  Gastrointestinal:  Negative for constipation.  Psychiatric/Behavioral:  Negative for suicidal ideas.   Blood pressure (!) 136/98, pulse 91, temperature 97.7 F (36.5 C), temperature source Oral, resp. rate 16, height 5\' 11"  (1.803 m), weight (!) 145.2 kg, SpO2 100 %. Body mass index is  44.63 kg/m.   Treatment Plan Summary: Daily contact with patient to assess and evaluate symptoms and progress in treatment and Medication management  Mr. Fatzinger is a 30 yr old male who presents under IVC for acute psychosis in setting of treatment resistance (mother verifies pill swallows daily). Had been on 2 antipsychotics (last haldol LAI dispensed early August, saphris 10 TID). Previously on clozapine with good response, stopped d/t drooling per pt. Oral consent from guardian and assent from pt to restart. 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 Patient appears physically weaker on exam today, although patient denies that he feels different. Patient's enuresis could be a side effect of Clozaril, will not adjust dosage tonight. Patient also appeared oversedated as he not only had enuresis but was more difficult to arouse for some staff this AM - Will decrease Ativan and continue to monitor. Patient continue to be seen by staff responding to internal stimuli.   Schizophrenia, disorganized -Continue Saphris 5 mg BID - goal of discontinuing as Clozaril becomes therapeutic -Continue Agitation Protocol: Zyprexa/Ativan/Geodon -Continue Clozaril to 50 mg QHS, will reassess for continued upward titration tomorrow (Troponin 4 on 9/3 and CRP 1.9 on 9/3)   Catatonia  No signs of catatonia  today.  -Decrease Ativan to 1 mg twice daily - topamax (incidentally on for wt loss) also tx catatonia   Nicotine Dependence: -Continue Nicotine Patch 14 mg daily   HTN: -Continue Amlodipine 5 mg daily.   H/o PE -Continue Eliquis 5 mg BID   Chronic Constipation: patient had self-reported BM, 9/3 and 9/4 -Continue Colace 400 mg QHS   Obesity: -Continue Topamax 25 mg BID and continue Metformin 24 hr 500 mg BID as written by PCP   MS/gait disorder: - Reportedly getting Ocrevus by neurology  - Home Health OT at D/c.    Other home meds: -Continue Xalatan 0.005%  1 drop both eyes QHS -Continue Claritin 10 mg daily  -DO NOT START TRAZODONE OR GABAPENTIN AS IT WAS STOPPED BY NEUROLOGY DUE TO GAIT INSTABILITY.  -Continue PRN's: Tylenol, Maalox, Atarax, Milk of Magnesia   PGY-2 Bobbye Morton, MD 03/10/2021, 11:50 AM

## 2021-03-10 NOTE — [facility] Group Notes (Signed)
Patient received group material 

## 2021-03-10 NOTE — Plan of Care (Signed)
Has been asleep.

## 2021-03-10 NOTE — Progress Notes (Signed)
Pt continues to respond to internal stimuli pt given PRN Trazodone per [facility] with HS medication    03/10/21 0100  Psych Admission Type (Psych Patients Only)  Admission Status Voluntary  Psychosocial Assessment  Patient Complaints Anxiety  Eye Contact Fair  Facial Expression Anxious  Affect Preoccupied  Speech Slow;Soft  Interaction Other (Comment) (appropriate)  Motor Activity Slow;Unsteady  Appearance/Hygiene Unremarkable  Aggressive Behavior  Effect No apparent injury  Thought Process  Coherency Circumstantial  Content Delusions  Delusions Paranoid  Perception Hallucinations  Hallucination None reported or observed  Judgment Poor  Confusion Mild  Danger to Self  Current suicidal ideation? Denies  Danger to Others  Danger to Others None reported or observed

## 2021-03-10 NOTE — Progress Notes (Signed)
Patient became more alert toward the evening.Was able to come to the medication window to receive dinnertime medications. Continues to endorse hallucinations and was observed talking to himself.   He continues to refuse to perform hygiene and has body odor.

## 2021-03-10 NOTE — Group Note (Signed)
Clinical Social Work Note  Group was not held in person due to COVID-19 outbreak and isolation requirements on the unit. CSW provided handouts on building healthy supports to work on individually in rooms.  Brandon Cude Grossman-Orr, LCSW 03/10/2021, 12:19 PM    

## 2021-03-10 NOTE — [facility] Group Notes (Signed)
Relaxation-music therapy 

## 2021-03-11 ENCOUNTER — Encounter (HOSPITAL_COMMUNITY): Payer: Self-pay

## 2021-03-11 LAB — RESP PANEL BY RT-PCR (FLU A&B, COVID) ARPGX2
Influenza A by PCR: NEGATIVE
Influenza B by PCR: NEGATIVE
SARS Coronavirus 2 by RT PCR: NEGATIVE

## 2021-03-11 LAB — GLUCOSE, CAPILLARY
Glucose-Capillary: 146 mg/dL — ABNORMAL HIGH (ref 70–99)
Glucose-Capillary: 138 mg/dL — ABNORMAL HIGH (ref 70–99)
Glucose-Capillary: 146 mg/dL — ABNORMAL HIGH (ref 70–99)

## 2021-03-11 MED ORDER — ASENAPINE MALEATE 5 MG SL SUBL
5.0000 mg | SUBLINGUAL_TABLET | SUBLINGUAL | Status: DC
  Administered 2021-03-12 – 2021-03-13 (×2): 5 mg via SUBLINGUAL
  Filled 2021-03-11 (×3): qty 1

## 2021-03-11 MED ORDER — CLOZAPINE 25 MG PO TABS
75.0000 mg | ORAL_TABLET | Freq: Every day | ORAL | Status: DC
  Administered 2021-03-11 – 2021-03-13 (×3): 75 mg via ORAL
  Filled 2021-03-11 (×6): qty 3

## 2021-03-11 NOTE — Progress Notes (Signed)
D: Patient observed pacing the hallway. He was asked several times by this nurse to take a shower. Patient is urinating on himself and leaving his clothing on. He was given several gowns and a brown paper bag to put his dirty clothing in. Patient had a show of force with staff; he entered the bathroom, turned on the water, and put one hand in. Nurse intervened and informed him that if he did not remove his clothing, we would do it for him. He then removed his clothing while yelling loudly. He has asked for several items which are already in his room. Patient is now observed walking up and down the hallway yelling out incomprehensible phrases. Patient needs intermittent redirection during the day. He remains with disorganized thinking.  A: Continue to monitor medication management and MD orders.  Safety checks completed every 15 minutes per protocol.  Offer support and encouragement as needed.  R: Patient needs intermittent redirection.    03/11/21 1600  Psych Admission Type (Psych Patients Only)  Admission Status Voluntary  Psychosocial Assessment  Patient Complaints None  Eye Contact Avertive  Facial Expression Blank;Flat  Affect Blunted  Speech Slow  Interaction Minimal  Motor Activity Pacing;Slow  Appearance/Hygiene Body odor  Behavior Characteristics Unwilling to participate  Mood Labile  Thought Process  Coherency Circumstantial  Content Preoccupation  Delusions Paranoid  Perception Hallucinations  Hallucination None reported or observed  Judgment Poor  Confusion Mild  Danger to Self  Current suicidal ideation? Denies  Danger to Others  Danger to Others None reported or observed

## 2021-03-11 NOTE — [facility] Group Notes (Signed)
Topic:   Due to the acuity, complex discharge plans, and Covid-19 precautions, group was not held. Patient was provided therapeutic worksheets and asked to meet with CSW as needed. 

## 2021-03-11 NOTE — BH IP Treatment Plan (Signed)
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[facility])  Secondary Diagnoses: Principal Problem:   Schizophrenia, disorganized type ([place])   Current Medications:  Current Facility-Administered Medications  Medication Dose Route Frequency Provider Last Rate Last Admin   acetaminophen (TYLENOL) tablet 650 mg  650 mg Oral Q6H PRN Rankin, Shuvon B, NP       alum & mag hydroxide-simeth (MAALOX/MYLANTA) 200-200-20 MG/5ML suspension 30 mL  30 mL Oral Q4H PRN Rankin, Shuvon B, NP       amLODipine (NORVASC) tablet 5 mg  5 mg Oral Daily Lindon Romp A, NP   5 mg at 03/11/21 0829   apixaban (ELIQUIS) tablet 5 mg  5 mg Oral BID Lindon Romp A, NP   5 mg at 03/11/21 0829   asenapine (SAPHRIS) sublingual tablet 5 mg  5 mg Sublingual BID Armando Reichert, MD   5 mg at 03/11/21 0829   cloZAPine (CLOZARIL) tablet 50 mg  50 mg Oral QHS Cinderella, Margaret A   50 mg at 03/10/21 2053   docusate sodium (COLACE) capsule 400 mg  400 mg Oral QHS Lindon Romp A, NP   400 mg at 03/10/21 2053   latanoprost (XALATAN) 0.005 % ophthalmic solution 1 drop  1 drop Both Eyes QHS Lindon Romp A, NP   1 drop at 03/10/21 2200   loratadine (CLARITIN) tablet 10 mg  10 mg Oral Daily Lindon Romp A, NP   10 mg at 03/11/21 0829   LORazepam (ATIVAN) tablet 1 mg  1 mg Oral BID Damita Dunnings B, MD   1 mg at 03/11/21 3710   magnesium hydroxide (MILK OF MAGNESIA) suspension 30 mL  30 mL Oral Daily PRN Rankin, Shuvon B, NP       metFORMIN (GLUCOPHAGE-XR) 24 hr tablet 500 mg  500 mg Oral BID WC Lindon Romp A, NP   500 mg at 03/11/21 6269   multivitamin with minerals tablet 1 tablet  1 tablet Oral Daily Lindon Romp A, NP   1 tablet at 03/11/21 4854   nicotine (NICODERM CQ - dosed in mg/24 hours) patch 14 mg  14 mg Transdermal Daily Lindon Romp A, NP   14 mg at 03/11/21 0831   OLANZapine zydis (ZYPREXA)  disintegrating tablet 10 mg  10 mg Oral Q8H PRN Harlow Asa, MD       And   ziprasidone (GEODON) injection 20 mg  20 mg Intramuscular PRN Harlow Asa, MD       topiramate (TOPAMAX) tablet 25 mg  25 mg Oral BID Lindon Romp A, NP   25 mg at 03/11/21 0829   traZODone (DESYREL) tablet 100 mg  100 mg Oral QHS PRN Margorie John W, PA-C   100 mg at 03/10/21 2052   PTA Medications: Medications Prior to Admission  Medication Sig Dispense Refill Last Dose   acetaminophen (TYLENOL) 500 MG tablet Take 1 tablet (500 mg total) by mouth every 6 (six) hours as needed. (Patient taking differently: Take 500 mg by mouth every 6 (six) hours as needed for moderate pain.) 30 tablet 0    amantadine (SYMMETREL) 100 MG capsule Take 100 mg by mouth 2 (two) times daily.      amLODipine (NORVASC) 5 MG tablet Take 5 mg by mouth daily.      apixaban (ELIQUIS) 5 MG TABS tablet Take 5 mg by mouth 2 (two) times daily.  Asenapine Maleate 10 MG SUBL Place 1 tablet (10 mg total) under the tongue in the morning, at noon, and at bedtime. 90 tablet 1    clonazePAM (KLONOPIN) 0.5 MG tablet Take 0.5 mg by mouth daily as needed for anxiety.      docusate sodium (COLACE) 100 MG capsule Take 400 mg by mouth at bedtime.       haloperidol (HALDOL) 10 MG tablet Take 1 tablet (10 mg total) by mouth 2 (two) times daily. 60 tablet 0    haloperidol decanoate (HALDOL DECANOATE) 100 MG/ML injection Inject 3 mLs (300 mg total) into the muscle every 28 (twenty-eight) days. 3 mL 3    latanoprost (XALATAN) 0.005 % ophthalmic solution INSTILL 1 DROP INTO BOTH EYES EVERY NIGHT 7.5 mL 10    loratadine (CLARITIN) 10 MG tablet Take 10 mg by mouth daily.      metFORMIN (GLUCOPHAGE-XR) 500 MG 24 hr tablet Take 500 mg by mouth 2 (two) times daily with a meal.      Multiple Vitamin (MULTIVITAMIN WITH MINERALS) TABS tablet Take 1 tablet by mouth daily.      ocrelizumab 300 mg in sodium chloride 0.9 % 500 mL Inject 300 mg into the vein every 6  (six) months. CHANGE ::  Ocrevus 614m IV EVERY 8 (EIGHT) months. (Patient taking differently: Inject 300 mg into the vein See admin instructions. CHANGE ::  Ocrevus 604mIV EVERY 8 (EIGHT) months.) 1 each 2    topiramate (TOPAMAX) 25 MG tablet TAKE 1 TABLET (25 MG TOTAL) BY MOUTH TWO (TWO) TIMES DAILY. (Patient taking differently: Take 25 mg by mouth 2 (two) times daily.) 60 tablet 5     Patient Stressors: Financial difficulties   Health problems   Occupational concerns    Patient Strengths: Motivation for treatment/growth  Supportive family/friends   Treatment Modalities: Medication Management, Group therapy, Case management,  1 to 1 session with clinician, Psychoeducation, Recreational therapy.   Physician Treatment Plan for Primary Diagnosis: Schizophrenia, disorganized type [place] Improvement in symptoms so as ready for discharge   Short Term Goals: Ability to identify changes in lifestyle to reduce recurrence of condition will improve Ability to verbalize feelings will improve Ability to identify and develop effective coping behaviors will improve Compliance with prescribed medications will improve Ability to identify triggers associated with substance abuse/mental health issues will improve  Medication Management: Evaluate patient's response, side effects, and tolerance of medication regimen.  Therapeutic Interventions: 1 to 1 sessions, Unit Group sessions and Medication administration.  Evaluation of Outcomes: Progressing  Physician Treatment Plan for Secondary Diagnosis: Principal Problem:   Schizophrenia, disorganized type ([place] Term Goal(s): Improvement in symptoms so as ready for discharge   Short Term Goals: Ability to identify changes in lifestyle to reduce recurrence of condition will improve Ability to verbalize feelings will improve Ability to identify and develop effective coping behaviors will improve Compliance with prescribed  medications will improve Ability to identify triggers associated with substance abuse/mental health issues will improve     Medication Management: Evaluate patient's response, side effects, and tolerance of medication regimen.  Therapeutic Interventions: 1 to 1 sessions, Unit Group sessions and Medication administration.  Evaluation of Outcomes: Progressing   RN Treatment Plan for Primary Diagnosis: Schizophrenia, disorganized type ([place] Go[place]): Knowledge of disease and therapeutic regimen to maintain health will improve  Short Term Goals: Ability to remain free from injury will improve, Ability to verbalize frustration and anger appropriately will improve, Ability  to demonstrate self-control, Ability to participate in decision making will improve, and Compliance with prescribed medications will improve  Medication Management: RN will administer medications as ordered by provider, will assess and evaluate patient's response and provide education to patient for prescribed medication. RN will report any adverse and/or side effects to prescribing provider.  Therapeutic Interventions: 1 on 1 counseling sessions, Psychoeducation, Medication administration, Evaluate responses to treatment, Monitor vital signs and CBGs as ordered, Perform/monitor CIWA, COWS, AIMS and Fall Risk screenings as ordered, Perform wound care treatments as ordered.  Evaluation of Outcomes: Not Met   LCSW Treatment Plan for Primary Diagnosis: Schizophrenia, disorganized type ([place]) Long Term Goal(s): Safe transition to appropriate next level of care at discharge, Engage patient in therapeutic group addressing interpersonal concerns.  Short Term Goals: Engage patient in aftercare planning with referrals and resources, Increase social support, Increase ability to appropriately verbalize feelings, Increase emotional regulation, Identify triggers associated with mental health/substance abuse issues, and Increase  skills for wellness and recovery  Therapeutic Interventions: Assess for all discharge needs, 1 to 1 time with Social worker, Explore available resources and support systems, Assess for adequacy in community support network, Educate family and significant other(s) on suicide prevention, Complete Psychosocial Assessment, Interpersonal group therapy.  Evaluation of Outcomes: Progressing   Progress in Treatment: Attending groups: Yes. Participating in groups: Yes. Taking medication as prescribed: Yes. Toleration medication: Yes. Family/Significant other contact made: Yes, individual(s) contacted:  with mother Patient understands diagnosis: Yes. Discussing patient identified problems/goals with staff: Yes. Medical problems stabilized or resolved: Yes. Denies suicidal/homicidal ideation: Yes. Issues/concerns per patient self-inventory: No.   New problem(s) identified: No, Describe:  none  New Short Term/Long Term Goal(s): medication stabilization, elimination of SI thoughts, development of comprehensive mental wellness plan.    Patient Goals:  "To go home"  Discharge Plan or Barriers: Pt is to follow up with current providers. Pt is to return home while awaiting group home placement.   Reason for Continuation of Hospitalization: Delusions  Medical Issues Medication stabilization  Estimated Length of Stay: 3-5 days   Scribe for Treatment Team: Vassie Moselle, LCSW 03/11/2021 12:14 PM

## 2021-03-11 NOTE — Progress Notes (Addendum)
Brandon [name] has been in and out of his room much of the evening.  He was noted staring out the back door and and occasional trying to open it.  He was difficult to redirect at times.  No outbursts this evening.  He denied SI/HI or AVH.  He does appear to be responding to internal stimuli as he was noted talking loudly in his room.  He is currently resting with hs eyes closed and appears to be asleep.  Q 15 minute checks maintained for safety.       03/11/21 2039  Psych Admission Type (Psych Patients Only)  Admission Status Voluntary  Psychosocial Assessment  Patient Complaints Insomnia  Eye Contact Avertive  Facial Expression Blank;Flat  Affect Blunted  Speech Incoherent;Soft;Slow;Slurred  Interaction Minimal  Motor Activity Pacing;Slow  Appearance/Hygiene Body odor  Behavior Characteristics Cooperative  Mood Labile  Thought Process  Coherency Circumstantial  Content Preoccupation  Delusions Paranoid  Perception Hallucinations  Hallucination Auditory (appears to be responding to internal stimuli)  Judgment Poor  Confusion Mild  Danger to Self  Current suicidal ideation? Denies  Danger to Others  Danger to Others None reported or observed

## 2021-03-11 NOTE — Progress Notes (Signed)
Pharmacy:  Clozapine  Pt enrolled in clozapine rems Pts REMS id [id number]  Weekly ANC required   Peggye Fothergill, [place] D

## 2021-03-11 NOTE — Progress Notes (Signed)
DAR Note: Patient with blunted affect and depressed mood, denied SI/HI/AVH, but was observed talking out loud to himself. During medication times, pt came to the med window. As RN tried to get pt's meds, pt reached to touch this RN's hair, and was redirection, and was cooperative to the redirections. Pt took all of their meds as scheduled and is being maintained on Q15 minute checks for safety.   03/11/21 0136  Psych Admission Type (Psych Patients Only)  Admission Status Voluntary  Psychosocial Assessment  Patient Complaints None  Eye Contact Fair  Facial Expression Flat  Affect Blunted  Speech Slow  Interaction Minimal  Motor Activity Restless;Slow  Appearance/Hygiene Unremarkable  Behavior Characteristics Cooperative  Mood Anxious  Thought Process  Coherency Circumstantial;Blocking  Content Delusions;Preoccupation  Delusions Paranoid  Perception Hallucinations  Hallucination None reported or observed  Judgment Poor  Confusion Mild  Danger to Self  Current suicidal ideation? Denies  Danger to Others  Danger to Others None reported or observed

## 2021-03-11 NOTE — Progress Notes (Signed)
[facility]  03/11/2021 2:53 PM Brandon [name]  MRN:  [id number]  Chief Complaint: Psychosis  Subjective:   Patient lying in bed with shirt over his head. States he feels less tired today. Has been easier for him to get out of bed. Notable latency of thought, poor eye contact. States his mood is "stable". Slept well last night. Appetite is good. No SI/HI. No VH/AH. Nothing has been bothering him. Oriented to self, location, month. No questions or concerns about his medications. Thinks the clozaril is working well - helps him with his mood, his state, helps him stop freaking out. Helps him hear less voices. He did talk to his mom - talked to her yesterday. It went good. They talked about where he will go from here. Some rocking back and forth intermittently during conversation. He is curious about discharge.  [name]ab[facility] at [phone].  Mom states she told the social worker to find group home for him as she is not able to take care of him due to his double diagnosis of MS and schizophrenia.  Mom clarified that it will be a permanent placement for him and he can come and visit them on weekends.  Mom also states that his infusion for MS is due on this Thursday on 03/14/2021 and he should not miss that.   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HCC) Diagnosis: Principal Problem:   Schizophrenia, disorganized type (HCC)  Total Time Spent in Direct Patient Care: 20 minutes  Past Psychiatric History: See H&P  Past Medical History:  Past Medical History:  Diagnosis Date   ADHD (attention deficit hyperactivity disorder)    Bipolar 1 disorder (HCC)    Chronic back pain    Chronic constipation    Chronic neck pain    Hypertension    Multiple sclerosis (HCC) 05/20/2013   left sided weakness, dysarthria   Non-compliance    Obesity    Pulmonary embolism (HCC)    Schizophrenia (HCC)    Stroke (HCC)    left sided deficits - pt's mother denies this    Past  Surgical History:  Procedure Laterality Date   NO PAST SURGERIES     None     RADIOLOGY WITH ANESTHESIA N/A 01/16/2021   Procedure: MRI WITH ANESTHESIA CERVICAL AND THORASIC SPINE WITH AND WITHOUT CONTRAST;  Surgeon: Radiologist, Medication, MD;  Location: [facility] OR;  Service: Radiology;  Laterality: N/A;   TOOTH EXTRACTION N/A 06/24/2019   Procedure: DENTAL RESTORATION/EXTRACTION OF TEETH NUMBER ONE, SIXTEEN, SEVENTEEN, NINETEEN, THIRTY-TWO;  Surgeon: Ocie DoyneJensen, Scott, DDS;  Location: [facility] OR;  Service: Oral Surgery;  Laterality: N/A;   Family History:  Family History  Problem Relation Age of Onset   Diabetes Mother    ADD / ADHD Brother    Family Psychiatric  History: See H&P Social History:  Social History   Substance and Sexual Activity  Alcohol Use Not Currently   Alcohol/week: 0.0 standard drinks   Comment: "A little bit"      Social History   Substance and Sexual Activity  Drug Use Not Currently   Types: Marijuana   Comment: Last used: unknown     Social History   Socioeconomic History   Marital status: Single    Spouse name: Not on file   Number of children: 0   Years of education: 11th   Highest education level: Not on file  Occupational History   Occupation: unemployed    Employer: Teacher, [employer]  Comment: Disbaled  Tobacco Use   Smoking status: Every Day    Packs/day: 0.25    Types: Cigarettes   Smokeless tobacco: Never   Tobacco comments:    2 cigarettes a day  Vaping Use   Vaping Use: Never used  Substance and Sexual Activity   Alcohol use: Not Currently    Alcohol/week: 0.0 standard drinks    Comment: "A little bit"    Drug use: Not Currently    Types: Marijuana    Comment: Last used: unknown    Sexual activity: Not on file  Other Topics Concern   Not on file  Social History Narrative   Patient lives at home with his mother.   Disabled.   Education 11 th grade .   Right handed.   Drinks caffeine occassionally   Social Determinants of Manufacturing engineer Strain: Not on file  Food Insecurity: Not on file  Transportation Needs: Not on file  Physical Activity: Not on file  Stress: Not on file  Social Connections: Not on file    Sleep: Fair  Appetite:  Good  Current Medications: Current Facility-Administered Medications  Medication Dose Route Frequency Provider Last Rate Last Admin   acetaminophen (TYLENOL) tablet 650 mg  650 mg Oral Q6H PRN Rankin, Shuvon B, NP       alum & mag hydroxide-simeth (MAALOX/MYLANTA) 200-200-20 MG/5ML suspension 30 mL  30 mL Oral Q4H PRN Rankin, Shuvon B, NP       amLODipine (NORVASC) tablet 5 mg  5 mg Oral Daily Nira Conn A, NP   5 mg at 03/11/21 0829   apixaban (ELIQUIS) tablet 5 mg  5 mg Oral BID Nira Conn A, NP   5 mg at 03/11/21 0829   asenapine (SAPHRIS) sublingual tablet 5 mg  5 mg Sublingual BID Karsten Ro, MD   5 mg at 03/11/21 0829   cloZAPine (CLOZARIL) tablet 50 mg  50 mg Oral QHS Cinderella, Margaret A   50 mg at 03/10/21 2053   docusate sodium (COLACE) capsule 400 mg  400 mg Oral QHS Nira Conn A, NP   400 mg at 03/10/21 2053   latanoprost (XALATAN) 0.005 % ophthalmic solution 1 drop  1 drop Both Eyes QHS Nira Conn A, NP   1 drop at 03/10/21 2200   loratadine (CLARITIN) tablet 10 mg  10 mg Oral Daily Nira Conn A, NP   10 mg at 03/11/21 0829   LORazepam (ATIVAN) tablet 1 mg  1 mg Oral BID Eliseo Gum B, MD   1 mg at 03/11/21 4481   magnesium hydroxide (MILK OF MAGNESIA) suspension 30 mL  30 mL Oral Daily PRN Rankin, Shuvon B, NP       metFORMIN (GLUCOPHAGE-XR) 24 hr tablet 500 mg  500 mg Oral BID WC Nira Conn A, NP   500 mg at 03/11/21 8563   multivitamin with minerals tablet 1 tablet  1 tablet Oral Daily Nira Conn A, NP   1 tablet at 03/11/21 0829   nicotine (NICODERM CQ - dosed in mg/24 hours) patch 14 mg  14 mg Transdermal Daily Nira Conn A, NP   14 mg at 03/11/21 0831   OLANZapine zydis (ZYPREXA) disintegrating tablet 10 mg  10 mg Oral Q8H PRN  Comer Locket, MD       And   ziprasidone (GEODON) injection 20 mg  20 mg Intramuscular PRN Comer Locket, MD       topiramate (TOPAMAX) tablet 25 mg  25 mg Oral BID Nira Conn A, NP   25 mg at 03/11/21 7622   traZODone (DESYREL) tablet 100 mg  100 mg Oral QHS PRN Jaclyn Shaggy, PA-C   100 mg at 03/10/21 2052    Lab Results:  Results for orders placed or performed during the hospital encounter of 03/05/21 (from the past 48 hour(s))  Glucose, capillary     Status: Abnormal   Collection Time: 03/09/21  4:53 PM  Result Value Ref Range   Glucose-Capillary 142 (H) 70 - 99 mg/dL    Comment: Glucose reference range applies only to samples taken after fasting for at least 8 hours.  Glucose, capillary     Status: Abnormal   Collection Time: 03/10/21  6:44 AM  Result Value Ref Range   Glucose-Capillary 121 (H) 70 - 99 mg/dL    Comment: Glucose reference range applies only to samples taken after fasting for at least 8 hours.  Glucose, capillary     Status: Abnormal   Collection Time: 03/10/21 12:19 PM  Result Value Ref Range   Glucose-Capillary 142 (H) 70 - 99 mg/dL    Comment: Glucose reference range applies only to samples taken after fasting for at least 8 hours.  Glucose, capillary     Status: Abnormal   Collection Time: 03/10/21  5:28 PM  Result Value Ref Range   Glucose-Capillary 113 (H) 70 - 99 mg/dL    Comment: Glucose reference range applies only to samples taken after fasting for at least 8 hours.  Resp Panel by RT-PCR (Flu A&B, Covid) Nasopharyngeal Swab     Status: None   Collection Time: 03/11/21  4:33 AM   Specimen: Nasopharyngeal Swab; Nasopharyngeal(NP) swabs in vial transport medium  Result Value Ref Range   SARS Coronavirus 2 by RT PCR NEGATIVE NEGATIVE    Comment: (NOTE) SARS-CoV-2 target nucleic acids are NOT DETECTED.  The SARS-CoV-2 RNA is generally detectable in upper respiratory specimens during the acute phase of infection. The lowest concentration of  SARS-CoV-2 viral copies this assay can detect is 138 copies/mL. A negative result does not preclude SARS-Cov-2 infection and should not be used as the sole basis for treatment or other patient management decisions. A negative result may occur with  improper specimen collection/handling, submission of specimen other than nasopharyngeal swab, presence of viral mutation(s) within the areas targeted by this assay, and inadequate number of viral copies(<138 copies/mL). A negative result must be combined with clinical observations, patient history, and epidemiological information. The expected result is Negative.  Fact Sheet for Patients:  BloggerCourse.com  Fact Sheet for Healthcare Providers:  SeriousBroker.it  This test is no t yet approved or cleared by the [place] FDA and  has been authorized for detection and/or diagnosis of SARS-CoV-2 by FDA under an Emergency Use Authorization (EUA). This EUA will remain  in effect (meaning this test can be used) for the duration of the COVID-19 declaration under Section 564(b)(1) of the Act, 21 U.S.C.section 360bbb-3(b)(1), unless the authorization is terminated  or revoked sooner.       Influenza A by PCR NEGATIVE NEGATIVE   Influenza B by PCR NEGATIVE NEGATIVE    Comment: (NOTE) The Xpert Xpress SARS-CoV-2/FLU/RSV plus assay is intended as an aid in the diagnosis of influenza from Nasopharyngeal swab specimens and should not be used as a sole basis for treatment. Nasal washings and aspirates are unacceptable for Xpert Xpress SARS-CoV-2/FLU/RSV testing.  Fact Sheet for Patients: BloggerCourse.com  Fact Sheet for Healthcare Providers: SeriousBroker.it  This test is not yet approved or cleared by the [place] and has been authorized for detection and/or diagnosis of SARS-CoV-2 by FDA under an Emergency Use Authorization (EUA).  This EUA will remain in effect (meaning this test can be used) for the duration of the COVID-19 declaration under Section 564(b)(1) of the Act, 21 U.S.C. section 360bbb-3(b)(1), unless the authorization is terminated or revoked.  Performed at [facility], [address], [place], [place] [address]   Glucose, capillary     Status: Abnormal   Collection Time: 03/11/21  5:38 AM  Result Value Ref Range   Glucose-Capillary 146 (H) 70 - 99 mg/dL    Comment: Glucose reference range applies only to samples taken after fasting for at least 8 hours.  Glucose, capillary     Status: Abnormal   Collection Time: 03/11/21 11:40 AM  Result Value Ref Range   Glucose-Capillary 138 (H) 70 - 99 mg/dL    Comment: Glucose reference range applies only to samples taken after fasting for at least 8 hours.    Blood Alcohol level:  Lab Results  Component Value Date   ETH <10 03/04/2021   ETH <10 09/13/2020    Metabolic Disorder Labs: Lab Results  Component Value Date   HGBA1C 5.5 03/07/2021   MPG 111.15 03/07/2021   MPG 123 04/30/2020   Lab Results  Component Value Date   PROLACTIN 13.6 04/30/2020   Lab Results  Component Value Date   CHOL 151 03/07/2021   TRIG 154 (H) 03/07/2021   HDL 32 (L) 03/07/2021   CHOLHDL 4.7 03/07/2021   VLDL 31 03/07/2021   LDLCALC 88 03/07/2021   LDLCALC 77 04/30/2020    Physical Findings:  Musculoskeletal: Strength & Muscle Tone: decreased Gait & Station:  Deferred Patient leans: N/A  Psychiatric Specialty Exam:  Presentation  General Appearance: Appropriate for Environment  Eye Contact:Fair  Speech:Slow dysarthric, mumbling  Speech Volume:Decreased  Handedness:Right   Mood and Affect  Mood: Euthymic  Affect:Blunt   Thought Process  Thought Processes:Coherent  Descriptions of Associations:Intact   Orientation:Full (Time, Place and Person)  Thought Content:Denies AVH and would not cooperate to answer questions  about ideas of reference, first rank symptoms, paranoia - no delusional comments noted; is not grossly responding to internal stimuli on exam  History of Schizophrenia/Schizoaffective disorder:Yes  Duration of Psychotic Symptoms:Greater than six months  Hallucinations:Hallucinations: None Description of Auditory Hallucinations: Noted by staff to still respond to internal stimuli  Ideas of Reference:None  Suicidal Thoughts:Suicidal Thoughts: No  Homicidal Thoughts:Homicidal Thoughts: No   Sensorium  Memory:Immediate Fair; Recent Poor; Remote Poor  Judgment:Fair - compliant with meds  Insight:lacking   Executive Functions  Concentration:Fair  Attention Span:Fair  Recall:Untested today  Fund of Knowledge:Poor  Language:Poor   Psychomotor Activity  Psychomotor Activity:Psychomotor Activity: Decreased   Assets  Assets:Desire for Improvement; Resilience; Social Support   Sleep  Sleep:7.5 hours  Physical Exam: Physical Exam Constitutional:      Appearance: He is obese.  HENT:     Head: Normocephalic and atraumatic.  Pulmonary:     Effort: Pulmonary effort is normal.  Neurological:     Mental Status: He is alert and oriented to person, place, and time.   Review of Systems  Constitutional:  Negative for fever.  HENT:  Negative for sore throat.   Respiratory:  Negative for cough.   Cardiovascular:  Negative for chest pain.  Gastrointestinal:  Negative for constipation.  Psychiatric/Behavioral:  Negative for hallucinations and  suicidal ideas. The patient is not nervous/anxious.   Blood pressure 116/72, pulse 95, temperature 97.7 F (36.5 C), temperature source Oral, resp. rate 16, height 5\' 11"  (1.803 m), weight (!) 145.2 kg, SpO2 100 %. Body mass index is 44.63 kg/m.   Treatment Plan Summary: Daily contact with patient to assess and evaluate symptoms and progress in treatment and Medication management  Brandon [name] is a 30 yr old male who presents under  IVC for acute psychosis in setting of treatment resistance (mother verifies pill swallows daily). Had been on 2 antipsychotics (last haldol LAI dispensed early August, saphris 10 TID). Previously on clozapine with good response, stopped d/t drooling per pt. Oral consent from guardian and assent from pt to restart. 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  Patient continue to be seen by staff responding to internal stimuli.   Schizophrenia, disorganized -Reduce Saphris to 5 mg morning only dose- goal of discontinuing as Clozaril becomes therapeutic -Continue Agitation Protocol: Zyprexa/Ativan/Geodon -Increase Clozaril to 75 mg QHS, will reassess for continued upward titration tomorrow (Troponin 4 on 9/3 and CRP 1.9 on 9/3). Will repeat CBC on Thursday.    Catatonia  No signs of catatonia today.  -Continue Ativan 1 mg twice daily. Reduced yesterday as Pt was lethargic.  - topamax (incidentally on for wt loss) also tx catatonia   Nicotine Dependence: -Continue Nicotine Patch 14 mg daily   HTN: -Continue Amlodipine 5 mg daily.   H/o PE -Continue Eliquis 5 mg BID   Chronic Constipation: patient had self-reported BM, 9/3 and 9/4 -Continue Colace 400 mg QHS   Obesity: -Continue Topamax 25 mg BID and continue Metformin 24 hr 500 mg BID as written by PCP   MS/gait disorder: - Reportedly getting Ocrevus by neurology  - Home Health OT at D/c.    Other home meds: -Continue Xalatan 0.005% 1 drop both eyes QHS -Continue Claritin 10 mg daily  -DO NOT START TRAZODONE OR GABAPENTIN AS IT WAS STOPPED BY NEUROLOGY DUE TO GAIT INSTABILITY.  -Continue PRN's: Tylenol, Maalox, Atarax, Milk of Magnesia   PGY-2 11/4, MD 03/11/2021, 2:53 PM

## 2021-03-12 ENCOUNTER — Encounter (HOSPITAL_COMMUNITY): Payer: Self-pay | Admitting: Registered Nurse

## 2021-03-12 LAB — GLUCOSE, CAPILLARY
Glucose-Capillary: 176 mg/dL — ABNORMAL HIGH (ref 70–99)
Glucose-Capillary: 191 mg/dL — ABNORMAL HIGH (ref 70–99)
Glucose-Capillary: 217 mg/dL — ABNORMAL HIGH (ref 70–99)

## 2021-03-12 NOTE — [facility] Counselor (Signed)
CSW spoke with this pt's mother who stated that she and this pt's care coordinator are currently seeking placement for this pt. She states that she is working on referring him to housing through [employer]. Pt's mother states that pt is able to return home at discharge while placement is being sought.     Ruthann Cancer MSW, LCSW Clincal Social Worker  [facility]

## 2021-03-12 NOTE — Progress Notes (Addendum)
Pt received PRN Zyprexa Zydis 10 mg PO at 1506 for agitation. Observed kicking locked door at the end of the unit, punched wall, yelled at mom on the phone and kicked room door X 2 in protest to be discharged. Per pt "I'm not crazy like y'all and my mom think I am. I just need some cocaine to clear up and I will be fine, y'all just want to mess with my money. Let me go now, I can go live with my girlfriends". Pt presents delusional on interactions,  confused, A & O to self and place "I just need to leave, I brought myself here, let me go" during assessment. Pt refused to shower despite multiple prompts thus far "I already showered this morning" when he hasn't done so. Pt denies SI, HI, AVH and pain when assessed. However, pt was observed with inappropriate laughter, blank stares and verbal outburst in his room at intervals. Exhibits poor insight into his mental illness. Support and encouragement offered to pt this shift. Q 15 minutes safety checks maintained on unit. All medications given with verbal education and effects monitored.  Pt remains safe on unit. Tolerates meals and fluids well. Observed interacting well with staff when reassessed at 1600. Denies discomfort at this time.

## 2021-03-12 NOTE — Progress Notes (Signed)
Recreation Therapy Notes  Date: 9.6.22 Time: 0930 Location: [address]  Group Topic: Anxiety  Goal Area(s) Addresses:  Patient will identify what triggers anxiety. Patient will identify symptoms of anxiety. Patient will identify coping skills for anxiety.  Behavioral Response: Engaged  Intervention: Worksheet  Activity:  LRT met with each patient 1:1 to discuss how they are affected by anxiety and the coping skills used when dealing anxiety.  Education: Radiographer, therapeutic, Dentist.   Education Outcome: Acknowledges understanding/In group clarification offered/Needs additional education.   Clinical Observations/Feedback: Pt minimally participate in 1:1 discussion.  Pt stated his triggers for anxiety were people having the wrong idea about him and people talking down on him.  Physical symptoms are stress and heart rate speeding up.  Coping skills identified as reading a book and playing video games.    Victorino Sparrow, LRT/CTRS    Victorino Sparrow A 03/12/2021 12:06 PM

## 2021-03-12 NOTE — Progress Notes (Addendum)
[facility] MD Progress Note  03/12/2021 11:49 AM Brandon [name]  MRN:  [id number]  Chief Complaint: Psychosis  Subjective: Patient is seen and examined today.  Patient lying on the bed and staring at the ceiling.  States his mood is good.  Notable latency of thought, poor eye contact.  He slept well last night. He states his appetite is good.  No SI/HI.  No auditory visual hallucinations.  Patient denies any pain, headache, nausea, vomiting, dizziness, abdominal pain, constipation, diarrhea.  He states that Clozaril has been working well.  Patient is oriented to place, time and person. He knows month and year but not date.  Patient is able to tell days of the week backwards correctly but not months of the year.  No questions or concerns about the medications.  Nothing has been bothering him. Per nursing notes- Pt observed pacing in the hallway.  Patient urinated on himself and left his clothing on.  Patient was observed walking up and down the hallway yelling out incomprehensible phrases.  Patient needed intermittent redirection yesterday.  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HCC) Diagnosis: Principal Problem:   Schizophrenia, disorganized type (HCC)  Total Time Spent in Direct Patient Care: 20 minutes  Past Psychiatric History: See H&P  Past Medical History:  Past Medical History:  Diagnosis Date   ADHD (attention deficit hyperactivity disorder)    Bipolar 1 disorder (HCC)    Chronic back pain    Chronic constipation    Chronic neck pain    Hypertension    Multiple sclerosis (HCC) 05/20/2013   left sided weakness, dysarthria   Non-compliance    Obesity    Pulmonary embolism (HCC)    Schizophrenia (HCC)    Stroke (HCC)    left sided deficits - pt's mother denies this    Past Surgical History:  Procedure Laterality Date   NO PAST SURGERIES     None     RADIOLOGY WITH ANESTHESIA N/A 01/16/2021   Procedure: MRI WITH ANESTHESIA CERVICAL AND THORASIC SPINE WITH AND  WITHOUT CONTRAST;  Surgeon: Radiologist, Medication, MD;  Location: [facility] OR;  Service: Radiology;  Laterality: N/A;   TOOTH EXTRACTION N/A 06/24/2019   Procedure: DENTAL RESTORATION/EXTRACTION OF TEETH NUMBER ONE, SIXTEEN, SEVENTEEN, NINETEEN, THIRTY-TWO;  Surgeon: Ocie Doyne, DDS;  Location: [facility] OR;  Service: Oral Surgery;  Laterality: N/A;   Family History:  Family History  Problem Relation Age of Onset   Diabetes Mother    ADD / ADHD Brother    Family Psychiatric  History: See H&P Social History:  Social History   Substance and Sexual Activity  Alcohol Use Not Currently   Alcohol/week: 0.0 standard drinks   Comment: "A little bit"      Social History   Substance and Sexual Activity  Drug Use Not Currently   Types: Marijuana   Comment: Last used: unknown     Social History   Socioeconomic History   Marital status: Single    Spouse name: Not on file   Number of children: 0   Years of education: 11th   Highest education level: Not on file  Occupational History   Occupation: unemployed    Employer: [employer]    Comment: Disbaled  Tobacco Use   Smoking status: Every Day    Packs/day: 0.25    Types: Cigarettes   Smokeless tobacco: Never   Tobacco comments:    2 cigarettes a day  Vaping Use   Vaping Use: Never used  Substance and Sexual  Activity   Alcohol use: Not Currently    Alcohol/week: 0.0 standard drinks    Comment: "A little bit"    Drug use: Not Currently    Types: Marijuana    Comment: Last used: unknown    Sexual activity: Not on file  Other Topics Concern   Not on file  Social History Narrative   Patient lives at home with his mother.   Disabled.   Education 11 th grade .   Right handed.   Drinks caffeine occassionally   Social Determinants of Corporate investment banker Strain: Not on file  Food Insecurity: Not on file  Transportation Needs: Not on file  Physical Activity: Not on file  Stress: Not on file  Social Connections: Not on  file    Sleep: Good  Appetite:  Good  Current Medications: Current Facility-Administered Medications  Medication Dose Route Frequency Provider Last Rate Last Admin   acetaminophen (TYLENOL) tablet 650 mg  650 mg Oral Q6H PRN Rankin, Shuvon B, NP       alum & mag hydroxide-simeth (MAALOX/MYLANTA) 200-200-20 MG/5ML suspension 30 mL  30 mL Oral Q4H PRN Rankin, Shuvon B, NP       amLODipine (NORVASC) tablet 5 mg  5 mg Oral Daily Nira Conn A, NP   5 mg at 03/12/21 1006   apixaban (ELIQUIS) tablet 5 mg  5 mg Oral BID Nira Conn A, NP   5 mg at 03/12/21 1006   asenapine (SAPHRIS) sublingual tablet 5 mg  5 mg Sublingual Juline Patch, Traci Sermon, MD   5 mg at 03/12/21 0734   cloZAPine (CLOZARIL) tablet 75 mg  75 mg Oral QHS Karsten Ro, MD   75 mg at 03/11/21 2039   docusate sodium (COLACE) capsule 400 mg  400 mg Oral QHS Nira Conn A, NP   400 mg at 03/11/21 2039   latanoprost (XALATAN) 0.005 % ophthalmic solution 1 drop  1 drop Both Eyes QHS Nira Conn A, NP   1 drop at 03/11/21 2040   loratadine (CLARITIN) tablet 10 mg  10 mg Oral Daily Nira Conn A, NP   10 mg at 03/12/21 1007   LORazepam (ATIVAN) tablet 1 mg  1 mg Oral BID Eliseo Gum B, MD   1 mg at 03/12/21 1008   magnesium hydroxide (MILK OF MAGNESIA) suspension 30 mL  30 mL Oral Daily PRN Rankin, Shuvon B, NP       metFORMIN (GLUCOPHAGE-XR) 24 hr tablet 500 mg  500 mg Oral BID WC Nira Conn A, NP   500 mg at 03/12/21 1007   multivitamin with minerals tablet 1 tablet  1 tablet Oral Daily Nira Conn A, NP   1 tablet at 03/12/21 1006   nicotine (NICODERM CQ - dosed in mg/24 hours) patch 14 mg  14 mg Transdermal Daily Nira Conn A, NP   14 mg at 03/12/21 1009   OLANZapine zydis (ZYPREXA) disintegrating tablet 10 mg  10 mg Oral Q8H PRN Comer Locket, MD       And   ziprasidone (GEODON) injection 20 mg  20 mg Intramuscular PRN Comer Locket, MD       topiramate (TOPAMAX) tablet 25 mg  25 mg Oral BID Nira Conn A, NP   25  mg at 03/12/21 1008   traZODone (DESYREL) tablet 100 mg  100 mg Oral QHS PRN Jaclyn Shaggy, PA-C   100 mg at 03/11/21 2039    Lab Results:  Results for orders placed  or performed during the hospital encounter of 03/05/21 (from the past 48 hour(s))  Glucose, capillary     Status: Abnormal   Collection Time: 03/10/21 12:19 PM  Result Value Ref Range   Glucose-Capillary 142 (H) 70 - 99 mg/dL    Comment: Glucose reference range applies only to samples taken after fasting for at least 8 hours.  Glucose, capillary     Status: Abnormal   Collection Time: 03/10/21  5:28 PM  Result Value Ref Range   Glucose-Capillary 113 (H) 70 - 99 mg/dL    Comment: Glucose reference range applies only to samples taken after fasting for at least 8 hours.  Resp Panel by RT-PCR (Flu A&B, Covid) Nasopharyngeal Swab     Status: None   Collection Time: 03/11/21  4:33 AM   Specimen: Nasopharyngeal Swab; Nasopharyngeal(NP) swabs in vial transport medium  Result Value Ref Range   SARS Coronavirus 2 by RT PCR NEGATIVE NEGATIVE    Comment: (NOTE) SARS-CoV-2 target nucleic acids are NOT DETECTED.  The SARS-CoV-2 RNA is generally detectable in upper respiratory specimens during the acute phase of infection. The lowest concentration of SARS-CoV-2 viral copies this assay can detect is 138 copies/mL. A negative result does not preclude SARS-Cov-2 infection and should not be used as the sole basis for treatment or other patient management decisions. A negative result may occur with  improper specimen collection/handling, submission of specimen other than nasopharyngeal swab, presence of viral mutation(s) within the areas targeted by this assay, and inadequate number of viral copies(<138 copies/mL). A negative result must be combined with clinical observations, patient history, and epidemiological information. The expected result is Negative.  Fact Sheet for Patients:  BloggerCourse.com  Fact  Sheet for Healthcare Providers:  SeriousBroker.it  This test is no t yet approved or cleared by the [place] FDA and  has been authorized for detection and/or diagnosis of SARS-CoV-2 by FDA under an Emergency Use Authorization (EUA). This EUA will remain  in effect (meaning this test can be used) for the duration of the COVID-19 declaration under Section 564(b)(1) of the Act, 21 U.S.C.section 360bbb-3(b)(1), unless the authorization is terminated  or revoked sooner.       Influenza A by PCR NEGATIVE NEGATIVE   Influenza B by PCR NEGATIVE NEGATIVE    Comment: (NOTE) The Xpert Xpress SARS-CoV-2/FLU/RSV plus assay is intended as an aid in the diagnosis of influenza from Nasopharyngeal swab specimens and should not be used as a sole basis for treatment. Nasal washings and aspirates are unacceptable for Xpert Xpress SARS-CoV-2/FLU/RSV testing.  Fact Sheet for Patients: BloggerCourse.com  Fact Sheet for Healthcare Providers: SeriousBroker.it  This test is not yet approved or cleared by the [place] FDA and has been authorized for detection and/or diagnosis of SARS-CoV-2 by FDA under an Emergency Use Authorization (EUA). This EUA will remain in effect (meaning this test can be used) for the duration of the COVID-19 declaration under Section 564(b)(1) of the Act, 21 U.S.C. section 360bbb-3(b)(1), unless the authorization is terminated or revoked.  Performed at [facility], [address] [place], [place] [address]   Glucose, capillary     Status: Abnormal   Collection Time: 03/11/21  5:38 AM  Result Value Ref Range   Glucose-Capillary 146 (H) 70 - 99 mg/dL    Comment: Glucose reference range applies only to samples taken after fasting for at least 8 hours.  Glucose, capillary     Status: Abnormal   Collection Time: 03/11/21 11:40 AM  Result Value Ref Range    Glucose-Capillary 138 (H) 70 - 99 mg/dL    Comment: Glucose reference range applies only to samples taken after fasting for at least 8 hours.  Glucose, capillary     Status: Abnormal   Collection Time: 03/11/21  4:26 PM  Result Value Ref Range   Glucose-Capillary 146 (H) 70 - 99 mg/dL    Comment: Glucose reference range applies only to samples taken after fasting for at least 8 hours.   Comment 1 Notify RN    Comment 2 Document in Chart   Glucose, capillary     Status: Abnormal   Collection Time: 03/12/21  6:08 AM  Result Value Ref Range   Glucose-Capillary 176 (H) 70 - 99 mg/dL    Comment: Glucose reference range applies only to samples taken after fasting for at least 8 hours.   Comment 1 Notify RN    Comment 2 Document in Chart     Blood Alcohol level:  Lab Results  Component Value Date   ETH <10 03/04/2021   ETH <10 09/13/2020    Metabolic Disorder Labs: Lab Results  Component Value Date   HGBA1C 5.5 03/07/2021   MPG 111.15 03/07/2021   MPG 123 04/30/2020   Lab Results  Component Value Date   PROLACTIN 13.6 04/30/2020   Lab Results  Component Value Date   CHOL 151 03/07/2021   TRIG 154 (H) 03/07/2021   HDL 32 (L) 03/07/2021   CHOLHDL 4.7 03/07/2021   VLDL 31 03/07/2021   LDLCALC 88 03/07/2021   LDLCALC 77 04/30/2020    Physical Findings:  Musculoskeletal: Strength & Muscle Tone: within normal limits Gait & Station:  Deferred Patient leans: N/A  Psychiatric Specialty Exam:  Presentation  General Appearance: disheveled Eye Contact:Minimal Speech:Slow dysarthric, mumbling Speech Volume:Decreased Handedness:Right   Mood and Affect  Mood: Euthymic Affect:Blunt   Thought Process  Thought Processes: some blocking Descriptions of Associations:Intact  Orientation:Full (Time, Place and Person)   Thought Content:Denies AVH and would not cooperate to answer questions about ideas of reference, first rank symptoms, paranoia - no delusional comments  noted; is not grossly responding to internal stimuli on exam  History of Schizophrenia/Schizoaffective disorder:Yes  Duration of Psychotic Symptoms:Greater than six months  Hallucinations:Hallucinations: None   Ideas of Reference:None  Suicidal Thoughts:Suicidal Thoughts: No  Homicidal Thoughts:Homicidal Thoughts: No   Sensorium  Memory:Immediate Fair; Recent Poor; Remote Poor  Judgment:Fair - compliant with meds  Insight:lacking   Executive Functions  Concentration:Fair  Attention Span:Fair  Recall:Untested today  Fund of Knowledge:Poor  Language:Poor   Psychomotor Activity  Psychomotor Activity:Psychomotor Activity: Decreased   Assets  Assets:Desire for Improvement; Resilience; Social Support   Sleep  Sleep:6.5 hours  Physical Exam: Physical Exam Constitutional:      Appearance: He is obese.  HENT:     Head: Normocephalic and atraumatic.  Pulmonary:     Effort: Pulmonary effort is normal.  Neurological:     Mental Status: He is alert and oriented to person, place, and time.   Review of Systems  Constitutional:  Negative for fever.  HENT:  Negative for sore throat.   Respiratory:  Negative for cough.   Cardiovascular:  Negative for chest pain.  Gastrointestinal:  Negative for constipation.  Psychiatric/Behavioral:  Negative for hallucinations and suicidal ideas. The patient is not nervous/anxious.   Blood pressure 125/68, pulse 92, temperature 97.9 F (36.6 C), temperature source Oral, resp. rate 20, height 5\' 11"  (1.803 m), weight (!) 145.2 kg, SpO2  98 %. Body mass index is 44.63 kg/m.   Treatment Plan Summary: Daily contact with patient to assess and evaluate symptoms and progress in treatment and Medication management Mr. Derald MacleodMcCrea is a 30 yr old male who presents under IVC for acute psychosis in setting of treatment resistance (mother verifies pill swallows daily). Had been on 2 antipsychotics (last haldol LAI dispensed early August, saphris  10 TID). In review of chart, has typically not responded well to most antipsychotics with documented compliance. He was previously on clozapine 2-3 years ago with good response, stopped d/t drooling per pt and mother. We discussed restarting this with his mother and pt who provided oral consent/assent to restart clozapine. We are slowly tapering him off of saphris given it has been ineffective and he has a vulnerable brain d/t MS and prior stroke. Ideally we would like to see him on clozapine as monotherapy (plus medication to ameliorate side effects if necessary) and are working towards this goal.   When he came into the hospital he appeared catatonic with posturing, mutism, staring, motor perseveration (BFRS 10). He was given 2 mg of IM ativan with response of most of these symptoms; his BFRS has been about 5 since that time. He was started on ativan 2 mg BID which has since been tapered to 1 mg BID, roughly equivalent to home klonopin 1 mg BID. May be able to reduce ativan as we reduce saphris and LAI washes out of system. Balancing psychosis, gait instability form MS and catatonia in this patient is challenging. Given this (and MS) will not attempt to further treat catatonia to [facility]RS of 0 as long as patient is getting out of bed and interacting with staff (which he was not doing on admission).   Current treatment plan includes increasing clozapine about every other day with careful monitoring of side effects. Lately staff have been reporting increased disorganization and need for redirection - had urinated on himself and refused to shower.   Schizophrenia, disorganized -Continue Saphris 5 mg morning only dose- goal of discontinuing as Clozaril becomes therapeutic -Continue Agitation Protocol: Zyprexa/Ativan/Geodon -Continue Clozaril 75 mg QHS, will reassess for continued upward titration tomorrow (Troponin 4 on 9/3 and CRP 1.9 on 9/3). Will repeat CBC with diff on Thursdays.    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  -Continue Ativan 1 mg twice daily.  - topamax (incidentally on for wt loss) also tx catatonia   Nicotine Dependence: -Continue Nicotine Patch 14 mg daily   HTN: -Continue Amlodipine 5 mg daily.   H/o PE -Continue Eliquis 5 mg BID   Chronic Constipation: patient had self-reported BM, 9/3 and 9/4 -Continue Colace 400 mg QHS   Obesity: -Continue Topamax 25 mg BID and continue Metformin 24 hr 500 mg BID as written by PCP   MS/gait disorder: - Reportedly getting Ocrevus by neurology (due 9/8 - have reached out to neurology) - Home Health OT at D/c.    Other home meds: -Continue Xalatan 0.005% 1 drop both eyes QHS -Continue Claritin 10 mg daily   -DO NOT START TRAZODONE OR GABAPENTIN AS IT WAS STOPPED BY NEUROLOGY DUE TO GAIT INSTABILITY.   -Continue PRN's: Tylenol, Maalox, Atarax, Milk of Magnesia   PGY-2 Karsten RoVandana  Doda, MD 03/12/2021, 11:49 AM  I have independently evaluated the patient during a face-to-face assessment on 03/12/21. I reviewed the patient's chart, and I participated in key portions of the service. I discussed the case with the [employer] Officer, and I agree with the  assessment and plan of care as documented in the House Officer's note.   [name], MD

## 2021-03-12 NOTE — Progress Notes (Signed)
Pt was given daily packet and discussed the goal for today.  Pt states that he is ready and looking forward to discharge.

## 2021-03-13 ENCOUNTER — Other Ambulatory Visit (HOSPITAL_COMMUNITY): Payer: Self-pay

## 2021-03-13 ENCOUNTER — Other Ambulatory Visit: Payer: Self-pay | Admitting: Neurology

## 2021-03-13 ENCOUNTER — Telehealth: Payer: Self-pay | Admitting: Neurology

## 2021-03-13 DIAGNOSIS — F209 Schizophrenia, unspecified: Secondary | ICD-10-CM

## 2021-03-13 DIAGNOSIS — G35 Multiple sclerosis: Secondary | ICD-10-CM

## 2021-03-13 LAB — GLUCOSE, CAPILLARY
Glucose-Capillary: 132 mg/dL — ABNORMAL HIGH (ref 70–99)
Glucose-Capillary: 119 mg/dL — ABNORMAL HIGH (ref 70–99)
Glucose-Capillary: 132 mg/dL — ABNORMAL HIGH (ref 70–99)

## 2021-03-13 LAB — RESP PANEL BY RT-PCR (FLU A&B, COVID) ARPGX2
Influenza A by PCR: NEGATIVE
Influenza B by PCR: NEGATIVE
SARS Coronavirus 2 by RT PCR: NEGATIVE

## 2021-03-13 MED ORDER — ASENAPINE MALEATE 5 MG SL SUBL
5.0000 mg | SUBLINGUAL_TABLET | Freq: Two times a day (BID) | SUBLINGUAL | Status: DC
  Administered 2021-03-13 – 2021-03-17 (×8): 5 mg via SUBLINGUAL
  Filled 2021-03-13 (×11): qty 1

## 2021-03-13 NOTE — Telephone Encounter (Signed)
I called the mother, left a message, I will call back later.

## 2021-03-13 NOTE — Group Note (Signed)
Recreation Therapy Group Note   Group Topic:Self-Esteem  Group Date: 03/13/2021 Start Time: 1000 End Time: 1040 Facilitators: Caroll Rancher, LRT/CTRS Location:  [address]  Goal Area(s) Addresses:  Patient will appropriately identify what self esteem is.  Patient will create a shield of armor describing themselves.  Patient will successfully identify positive attributes about themselves.  Patient will acknowledge benefit of improved self-esteem.  Patient will follow instructions on 1st prompt.   Intervention / Activity: Self-Esteem Shield. Patient engaged in 1:1 session LRT that focused on self esteem. Patient identified what self esteem is.  LRT gave handout to patient of a blank shield and an explanation of what was to go in each quadrant. Patient was asked to create their own shield to show off their unique attributes, four quadrants reflected the following:   The Upper Left quadrant- two people or things you value. The Upper Right quadrant- two lessons you have learned thus far in life. The Lower Left quadrant- three qualities that make you unique. The Lower Right quadrant- one goal you want to work towards.    Patients were provided sheets with the shield printed on them and materials to complete the assignment.   Education: Self esteem, Communication, Positive self-talk, Discharge Planning    Affect/Mood: Flat   Participation Level: Minimal   Participation Quality: Independent   Behavior: Cooperative   Speech/Thought Process: Organized   Insight: Fair   Judgement: Fair    Modes of Intervention: 1:1 activity with LRT due to COVID on the unit   Patient Response to Interventions:  Attentive   Education Outcome:  Acknowledges education   Clinical Observations/Individualized Feedback:  Pt was lying down when approached by LRT.  Pt did speak with LRT and listened as the activity was explained to him.  Pt identified the people and things he values as him mother,  grandmother and PS5.  Lessons he has learned are to be a leader not a follower and don't be misguided by by other people.  Pt identified the three qualities about him as strength, courage and strong training.  Plan: Continue to engage patient in RT group sessions 2-3x/week.   Caroll Rancher, LRT/CTRS 03/13/2021 2:02 PM

## 2021-03-13 NOTE — Telephone Encounter (Signed)
Pt's mother wants Dr Anne Hahn to be aware that it has been over 8 weeks since pt's last infusion.  Mother wants Dr Anne Hahn to be aware pt will infact have the scheduled infusion tomorrow morning.

## 2021-03-13 NOTE — Telephone Encounter (Signed)
FYI

## 2021-03-13 NOTE — [facility] Group Notes (Signed)
Adult Psychoeducational Group Note  Date:  03/13/2021 Time:  3:46 PM  Group Topic/Focus:  Building Self Esteem:   The Focus of this group is helping patients become aware of the effects of self-esteem on their lives, the things they and others do that enhance or undermine their self-esteem, seeing the relationship between their level of self-esteem and the choices they make and learning ways to enhance self-esteem.  Participation Level:  Minimal  Participation Quality:  Inattentive  Affect:  Defensive  Cognitive:  Lacking  Insight: Lacking  Engagement in Group:  Lacking  Modes of Intervention:  Discussion  Additional Comments:    Reymundo Poll 03/13/2021, 3:46 PM

## 2021-03-13 NOTE — Progress Notes (Signed)
Occupational Therapy Treatment Patient Details Name: [name] MRN: [id number] DOB: 05/02/1991 Today's Date: 03/13/2021    History of present illness Brandon [name] is a 30 y/o male with PMHx significant for gait abnormality, ADHD, disorganized schizophrenia, intellectual disability, hypertension, prior stroke with left-sided weakness, MS, and obesity. Pt is currently admitted inpatient to [facility] under IVC for acute psychosis. OT consulted to address functional mobility, gait abnormality, and engagement in ADL/iADLs.   OT comments  Pt was seen for OT follow-up on the inpatient unit at [facility]. Pt found in the hallway seated in chair. Pt reports he would like to discharge use of walker - able to ambulate up and down the hallway without assistance or use of AE; no LOB observed. OT will discontinue use of walker at this time. Recommend follow up with HHOT to address any home safety concerns. OT will sign off at this time, no acute needs identified.   Follow Up Recommendations       Equipment Recommendations       Recommendations for Other Services      Precautions / Restrictions           End of Session    OT Visit Diagnosis: Unsteadiness on feet (R26.81);Other abnormalities of gait and mobility (R26.89);Muscle weakness (generalized) (M62.81);History of falling (Z91.81);Other symptoms and signs involving cognitive function   Activity Tolerance Patient tolerated treatment well   Patient Left in chair   Nurse Communication Mobility status        Time: 9563-8756 OT Time Calculation (min): 8 min  Charges: OT General Charges $OT Visit: 1 Visit   03/13/2021  Donne Hazel, MOT, OTR/L  Coral Darlette Dubow 03/13/2021, 3:43 PM

## 2021-03-13 NOTE — [facility] Group Notes (Signed)
Topic:   Due to Covid-19 precautions, group was not held. Patient was provided therapeutic worksheets and asked to meet with CSW as needed.   Charnee Turnipseed, LCSW, LCAS Clincal Social Worker  [facility]  

## 2021-03-13 NOTE — Progress Notes (Addendum)
I have independently evaluated the patient during a face-to-face assessment on 03/13/21. I reviewed the patient's chart, and I participated in key portions of the service. I discussed the case with the [employer], and I agree with the assessment and plan of care as documented in the House Officer's note.   I called mom - shared useful information (namely that pt gets more aggressive in afternoon as pattern and that he is not resistant to showering when well, and that he is 8 months out form last infusion). Overall I feel patient continues to improve in the sense that he is up, active, getting out of bed, and interacting with staff and other patients. Will increase saphris today (hopefully temporarily) and increase clozapine. Adding troponin to AM labs tomorrow.    [name], MD   [facility] MD Progress Note  03/13/2021 11:06 AM Brandon [name]  MRN:  [id number]  Chief Complaint: Psychosis  Subjective: Patient seen in AM by Dr. Leone Haven and later by Dr. Gasper Sells, who called mother for today.   Patient is seen and examined today.  Patient sitting in bed and eating his breakfast appropriately. He states his mood is good.  Notable latency of thought. Eye contact much better than yesterday. Not staring much. He slept well last night. He states his appetite is good and he has been eating all his meals.  No SI/HI.  No auditory visual hallucinations. Asked about yesterday's episode of agitation.  Patient states he is okay now and calm today.  Patient denies any pain, headache, nausea, vomiting, dizziness, abdominal pain, constipation, diarrhea.  He denies any side effects from Clozaril.  Patient is oriented to place, time and person. He knows month and year but not date.  Patient is able to tell days of the week backwards correctly.  He was able to tell me months of the year correctly from December to April and stopped at April.  Anxious about discharge.  No questions or concerns about the medications.   Nothing has been bothering him. Specifically denied drooling and constipation.   Per nursing notes-patient was given as needed Zyprexa Zydis 10 mg for agitation at 1506 yesterday.  Patient was observed kicking locked doors at the end of the unit, punched walls and yelled at mom on phone and kicked room door twice in  protest to be discharged.  Patient stated that "I'm not crazy like y'all and my mom think I am. I just need some cocaine to clear up and I will be fine, y'all just want to mess with my money.  Patient was confused but oriented to self and place.  Refused to shower despite multiple prompts but later in the evening showered after prompted by nurses.  Spoke to mom  Spoke to mom for about 10 minutes. She had a good conversation with him today where he seemed more like himself. Noted he seemed upset yesterday afternoon; shared he became agitated - afternoon agitation is a pattern when he is doing poorly. Also shared that he frequently has worse hallucinations in the shower when psychotic and is not resistant to showering when doing well. She stresses that patient needs infusion ASAP as he is 8 months out from 6 month infusion so it has already been delayed; assured her we will do everything we can to get to infusion tomorrow.   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)   Unsteady gait   Multiple sclerosis (HCC)   Schizophrenia, disorganized type (HCC)  Total Time Spent in Direct Patient Care: 20 minutes  Past Psychiatric History: See H&P  Past Medical History:  Past Medical History:  Diagnosis Date   ADHD (attention deficit hyperactivity disorder)    Bipolar 1 disorder (HCC)    Chronic back pain    Chronic constipation    Chronic neck pain    Hypertension    Multiple sclerosis (HCC) 05/20/2013    left sided weakness, dysarthria   Non-compliance    Obesity    Pulmonary embolism (HCC)    Schizophrenia (HCC)    Stroke (HCC)    left sided deficits - pt's mother denies this    Past Surgical History:  Procedure Laterality Date   NO PAST SURGERIES     None     RADIOLOGY WITH ANESTHESIA N/A 01/16/2021   Procedure: MRI WITH ANESTHESIA CERVICAL AND THORASIC SPINE WITH AND WITHOUT CONTRAST;  Surgeon: Radiologist, Medication, MD;  Location: [facility] OR;  Service: Radiology;  Laterality: N/A;   TOOTH EXTRACTION N/A 06/24/2019   Procedure: DENTAL RESTORATION/EXTRACTION OF TEETH NUMBER ONE, SIXTEEN, SEVENTEEN, NINETEEN, THIRTY-TWO;  Surgeon: Ocie Doyne, DDS;  Location: [facility] OR;  Service: Oral Surgery;  Laterality: N/A;   Family History:  Family History  Problem Relation Age of Onset   Diabetes Mother    ADD / ADHD Brother    Family Psychiatric  History: See H&P Social History:  Social History   Substance and Sexual Activity  Alcohol Use Not Currently   Alcohol/week: 0.0 standard drinks   Comment: "A little bit"      Social History   Substance and Sexual Activity  Drug Use Not Currently   Types: Marijuana   Comment: Last used: unknown     Social History   Socioeconomic History   Marital status: Single    Spouse name: Not on file   Number of children: 0   Years of education: 11th   Highest education level: Not on file  Occupational History   Occupation: unemployed    Employer: [employer]    Comment: Disbaled  Tobacco Use   Smoking status: Every Day    Packs/day: 0.25    Types: Cigarettes   Smokeless tobacco: Never   Tobacco comments:    2 cigarettes a day  Vaping Use   Vaping Use: Never used  Substance and Sexual Activity   Alcohol use: Not Currently    Alcohol/week: 0.0 standard drinks    Comment: "A little bit"    Drug use: Not Currently    Types: Marijuana    Comment: Last used: unknown    Sexual activity: Not on file  Other Topics Concern   Not on  file  Social History Narrative   Patient lives at home with his mother.   Disabled.   Education 11 th grade .   Right handed.   Drinks caffeine occassionally   Social Determinants of Corporate investment banker Strain: Not on file  Food Insecurity: Not on file  Transportation Needs: Not on file  Physical Activity: Not on file  Stress: Not on file  Social Connections: Not on file    Sleep: Good  Appetite:  Good  Current Medications: Current Facility-Administered Medications  Medication Dose Route Frequency Provider Last Rate Last Admin   acetaminophen (TYLENOL) tablet 650 mg  650 mg Oral Q6H PRN Rankin, Shuvon B, NP  alum & mag hydroxide-simeth (MAALOX/MYLANTA) 200-200-20 MG/5ML suspension 30 mL  30 mL Oral Q4H PRN Rankin, Shuvon B, NP       amLODipine (NORVASC) tablet 5 mg  5 mg Oral Daily Nira Conn A, NP   5 mg at 03/13/21 0842   apixaban (ELIQUIS) tablet 5 mg  5 mg Oral BID Nira Conn A, NP   5 mg at 03/13/21 0842   asenapine (SAPHRIS) sublingual tablet 5 mg  5 mg Sublingual BID Karsten Ro, MD       cloZAPine (CLOZARIL) tablet 75 mg  75 mg Oral QHS Karsten Ro, MD   75 mg at 03/12/21 2127   docusate sodium (COLACE) capsule 400 mg  400 mg Oral QHS Nira Conn A, NP   400 mg at 03/12/21 2127   latanoprost (XALATAN) 0.005 % ophthalmic solution 1 drop  1 drop Both Eyes QHS Nira Conn A, NP   1 drop at 03/12/21 2126   loratadine (CLARITIN) tablet 10 mg  10 mg Oral Daily Nira Conn A, NP   10 mg at 03/13/21 0844   LORazepam (ATIVAN) tablet 1 mg  1 mg Oral BID Eliseo Gum B, MD   1 mg at 03/13/21 0844   magnesium hydroxide (MILK OF MAGNESIA) suspension 30 mL  30 mL Oral Daily PRN Rankin, Shuvon B, NP       metFORMIN (GLUCOPHAGE-XR) 24 hr tablet 500 mg  500 mg Oral BID WC Nira Conn A, NP   500 mg at 03/13/21 0844   multivitamin with minerals tablet 1 tablet  1 tablet Oral Daily Nira Conn A, NP   1 tablet at 03/13/21 0842   nicotine (NICODERM CQ - dosed in  mg/24 hours) patch 14 mg  14 mg Transdermal Daily Nira Conn A, NP   14 mg at 03/13/21 0846   OLANZapine zydis (ZYPREXA) disintegrating tablet 10 mg  10 mg Oral Q8H PRN Comer Locket, MD   10 mg at 03/12/21 1506   And   ziprasidone (GEODON) injection 20 mg  20 mg Intramuscular PRN Comer Locket, MD       topiramate (TOPAMAX) tablet 25 mg  25 mg Oral BID Nira Conn A, NP   25 mg at 03/13/21 0842   traZODone (DESYREL) tablet 100 mg  100 mg Oral QHS PRN Jaclyn Shaggy, PA-C   100 mg at 03/11/21 2039    Lab Results:  Results for orders placed or performed during the hospital encounter of 03/05/21 (from the past 48 hour(s))  Glucose, capillary     Status: Abnormal   Collection Time: 03/11/21 11:40 AM  Result Value Ref Range   Glucose-Capillary 138 (H) 70 - 99 mg/dL    Comment: Glucose reference range applies only to samples taken after fasting for at least 8 hours.  Glucose, capillary     Status: Abnormal   Collection Time: 03/11/21  4:26 PM  Result Value Ref Range   Glucose-Capillary 146 (H) 70 - 99 mg/dL    Comment: Glucose reference range applies only to samples taken after fasting for at least 8 hours.   Comment 1 Notify RN    Comment 2 Document in Chart   Glucose, capillary     Status: Abnormal   Collection Time: 03/12/21  6:08 AM  Result Value Ref Range   Glucose-Capillary 176 (H) 70 - 99 mg/dL    Comment: Glucose reference range applies only to samples taken after fasting for at least 8 hours.   Comment 1  Notify RN    Comment 2 Document in Chart   Glucose, capillary     Status: Abnormal   Collection Time: 03/12/21 12:01 PM  Result Value Ref Range   Glucose-Capillary 191 (H) 70 - 99 mg/dL    Comment: Glucose reference range applies only to samples taken after fasting for at least 8 hours.   Comment 1 Notify RN   Glucose, capillary     Status: Abnormal   Collection Time: 03/12/21  5:18 PM  Result Value Ref Range   Glucose-Capillary 217 (H) 70 - 99 mg/dL    Comment:  Glucose reference range applies only to samples taken after fasting for at least 8 hours.   Comment 1 Notify RN   Resp Panel by RT-PCR (Flu A&B, Covid) Nasopharyngeal Swab     Status: None   Collection Time: 03/13/21  4:56 AM   Specimen: Nasopharyngeal Swab; Nasopharyngeal(NP) swabs in vial transport medium  Result Value Ref Range   SARS Coronavirus 2 by RT PCR NEGATIVE NEGATIVE    Comment: (NOTE) SARS-CoV-2 target nucleic acids are NOT DETECTED.  The SARS-CoV-2 RNA is generally detectable in upper respiratory specimens during the acute phase of infection. The lowest concentration of SARS-CoV-2 viral copies this assay can detect is 138 copies/mL. A negative result does not preclude SARS-Cov-2 infection and should not be used as the sole basis for treatment or other patient management decisions. A negative result may occur with  improper specimen collection/handling, submission of specimen other than nasopharyngeal swab, presence of viral mutation(s) within the areas targeted by this assay, and inadequate number of viral copies(<138 copies/mL). A negative result must be combined with clinical observations, patient history, and epidemiological information. The expected result is Negative.  Fact Sheet for Patients:  BloggerCourse.com  Fact Sheet for Healthcare Providers:  SeriousBroker.it  This test is no t yet approved or cleared by the [place] FDA and  has been authorized for detection and/or diagnosis of SARS-CoV-2 by FDA under an Emergency Use Authorization (EUA). This EUA will remain  in effect (meaning this test can be used) for the duration of the COVID-19 declaration under Section 564(b)(1) of the Act, 21 U.S.C.section 360bbb-3(b)(1), unless the authorization is terminated  or revoked sooner.       Influenza A by PCR NEGATIVE NEGATIVE   Influenza B by PCR NEGATIVE NEGATIVE    Comment: (NOTE) The Xpert Xpress  SARS-CoV-2/FLU/RSV plus assay is intended as an aid in the diagnosis of influenza from Nasopharyngeal swab specimens and should not be used as a sole basis for treatment. Nasal washings and aspirates are unacceptable for Xpert Xpress SARS-CoV-2/FLU/RSV testing.  Fact Sheet for Patients: BloggerCourse.com  Fact Sheet for Healthcare Providers: SeriousBroker.it  This test is not yet approved or cleared by the [place] FDA and has been authorized for detection and/or diagnosis of SARS-CoV-2 by FDA under an Emergency Use Authorization (EUA). This EUA will remain in effect (meaning this test can be used) for the duration of the COVID-19 declaration under Section 564(b)(1) of the Act, 21 U.S.C. section 360bbb-3(b)(1), unless the authorization is terminated or revoked.  Performed at [facility], [address], [place], [place] [address]   Glucose, capillary     Status: Abnormal   Collection Time: 03/13/21  6:24 AM  Result Value Ref Range   Glucose-Capillary 132 (H) 70 - 99 mg/dL    Comment: Glucose reference range applies only to samples taken after fasting for at least 8 hours.  Blood Alcohol level:  Lab Results  Component Value Date   ETH <10 03/04/2021   ETH <10 09/13/2020    Metabolic Disorder Labs: Lab Results  Component Value Date   HGBA1C 5.5 03/07/2021   MPG 111.15 03/07/2021   MPG 123 04/30/2020   Lab Results  Component Value Date   PROLACTIN 13.6 04/30/2020   Lab Results  Component Value Date   CHOL 151 03/07/2021   TRIG 154 (H) 03/07/2021   HDL 32 (L) 03/07/2021   CHOLHDL 4.7 03/07/2021   VLDL 31 03/07/2021   LDLCALC 88 03/07/2021   LDLCALC 77 04/30/2020    Physical Findings:  Musculoskeletal: Strength & Muscle Tone: within normal limits Gait & Station:  Deferred Patient leans: N/A  Psychiatric Specialty Exam:  Presentation  General Appearance: Appropriate for  environment Eye Contact:Fair Speech:Slow dysarthric, mumbling Speech Volume:Decreased Handedness:Right   Mood and Affect  Mood: Euthymic Affect:Blunt   Thought Process  Thought Processes: some blocking Descriptions of Associations:Intact  Orientation:Full (Time, Place and Person)   Thought Content:Denies AVH and would not cooperate to answer questions about ideas of reference, first rank symptoms, paranoia - no delusional comments noted; is not grossly responding to internal stimuli on exam  History of Schizophrenia/Schizoaffective disorder:Yes  Duration of Psychotic Symptoms:Greater than six months  Hallucinations:Hallucinations: None   Ideas of Reference:None  Suicidal Thoughts:Suicidal Thoughts: No  Homicidal Thoughts:Homicidal Thoughts: No   Sensorium  Memory:Immediate Fair; Recent Poor; Remote Poor  Judgment:Fair - compliant with meds  Insight:lacking   Executive Functions  Concentration:Fair  Attention Span:Fair  Recall:Untested today  Fund of Knowledge:Poor  Language:Poor   Psychomotor Activity  Psychomotor Activity:Psychomotor Activity: Decreased   Assets  Assets:Desire for Improvement; Resilience; Social Support   Sleep  Sleep:6.5 hours  Physical Exam: Physical Exam Constitutional:      Appearance: He is obese.  HENT:     Head: Normocephalic and atraumatic.  Pulmonary:     Effort: Pulmonary effort is normal.  Neurological:     Mental Status: He is alert and oriented to person, place, and time.   Review of Systems  Constitutional:  Negative for fever.  HENT:  Negative for sore throat.   Respiratory:  Negative for cough.   Cardiovascular:  Negative for chest pain.  Gastrointestinal:  Negative for constipation.  Psychiatric/Behavioral:  Negative for hallucinations and suicidal ideas. The patient is not nervous/anxious.   Blood pressure (!) 124/95, pulse 86, temperature 97.9 F (36.6 C), temperature source Oral, resp. rate 20,  height 5\' 11"  (1.803 m), weight (!) 145.2 kg, SpO2 100 %. Body mass index is 44.63 kg/m.   Treatment Plan Summary: Daily contact with patient to assess and evaluate symptoms and progress in treatment and Medication management [name] is a 30 yr old male who presents under IVC for acute psychosis in setting of treatment resistance (mother verifies pill swallows daily). Had been on 2 antipsychotics (last haldol LAI dispensed early August, saphris 10 TID). In review of chart, has typically not responded well to most antipsychotics with documented compliance. He was previously on clozapine 2-3 years ago with good response, stopped d/t drooling per pt and mother. We discussed restarting this with his mother and pt who provided oral consent/assent to restart clozapine. We are slowly tapering him off of saphris given it has been ineffective and he has a vulnerable brain d/t MS and prior stroke. Ideally we would like to see him on clozapine as monotherapy (plus medication to ameliorate side effects if necessary) and are  working towards this goal.   When he came into the hospital he appeared catatonic with posturing, mutism, staring, motor perseveration (BFRS 10). He was given 2 mg of IM ativan with response of most of these symptoms; his BFRS has been about 5 since that time. He was started on ativan 2 mg BID which has since been tapered to 1 mg BID, roughly equivalent to home klonopin 1 mg BID. May be able to reduce ativan as we reduce saphris and LAI washes out of system. Balancing psychosis, gait instability form MS and catatonia in this patient is challenging. Given this (and MS) will not attempt to further treat catatonia to [facility] of 0 as long as patient is getting out of bed and interacting with staff (which he was not doing on admission).   Current treatment plan includes increasing clozapine about every other day with careful monitoring of side effects. Lately staff have been reporting increased  disorganization and need for redirection - he was agitated yesterday with yelling and punching doors and walls.   Schizophrenia, Chronic with acute exacerbation.  -Increase Saphris to 5 mg twice daily due to recent agitation episode.  - Goal of discontinuing as Clozaril becomes therapeutic -Continue Agitation Protocol: Zyprexa/Ativan/Geodon. -Increase  Clozaril to 100 mg QHS, will reassess for continued upward titration  - titrate up about every other day given frail brain.  (Troponin 4 on 9/3 and CRP 1.9 on 9/3). Will repeat CBC with diff on Thursdays.   - Repeat EKG given PRN last night  Catatonia  Have not been doing serial BFRS 2/2 pt preference. Some residual slowness noted.  -Continue Ativan 1 mg twice daily.  - topamax (incidentally on for wt loss) also tx catatonia, clozapine has data in catatonia as well.    Nicotine Dependence: -Continue Nicotine Patch 14 mg daily   HTN: -Continue Amlodipine 5 mg daily.   H/o PE -Continue Eliquis 5 mg BID   Chronic Constipation: patient had self-reported BM, 9/3 and 9/4 -Continue Colace 400 mg QHS   Obesity: -Continue Topamax 25 mg BID and continue Metformin 24 hr 500 mg BID as written by PCP   MS/gait disorder: - Reportedly getting Ocrevus by neurology (due 9/8) - Reached out to neurology and Pt can be transferred to [facility] for infusion tomorrow. Sent message to neurology to know if they want updated covid testing for tomorrow. Will follow up. Pt had COVID testing today which was negative.   - Home Health OT at D/c.   Other home meds: -Continue Xalatan 0.005% 1 drop both eyes QHS -Continue Claritin 10 mg daily   -DO NOT START TRAZODONE OR GABAPENTIN AS IT WAS STOPPED BY NEUROLOGY DUE TO GAIT INSTABILITY.   -Continue PRN's: Tylenol, Maalox, Atarax, Milk of Magnesia   PGY-2 Karsten Ro, MD 03/13/2021, 11:06 AM

## 2021-03-13 NOTE — Progress Notes (Signed)
   03/13/21 2045  Psych Admission Type (Psych Patients Only)  Admission Status Voluntary  Psychosocial Assessment  Patient Complaints None  Eye Contact Brief  Facial Expression Anxious  Affect Preoccupied  Speech Soft  Interaction Minimal  Motor Activity Slow  Appearance/Hygiene Disheveled;Poor hygiene  Behavior Characteristics Cooperative  Mood Suspicious;Preoccupied  Thought Process  Coherency Circumstantial  Content Preoccupation  Delusions Paranoid  Perception Hallucinations  Hallucination None reported or observed  Judgment Poor  Confusion None  Danger to Self  Current suicidal ideation? Denies  Danger to Others  Danger to Others None reported or observed   Pt denies SI, HI, AVH and pain. Pt suspicious. Standing behind bathroom door looking out as he is speaking to this Clinical research associate. Pt denies anxiety and depression. Appears to be responding. Pt watchful. 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

## 2021-03-13 NOTE — Progress Notes (Signed)
   03/13/21 0100  Psych Admission Type (Psych Patients Only)  Admission Status Voluntary  Psychosocial Assessment  Patient Complaints None  Eye Contact Avertive  Facial Expression Blank  Affect Blunted  Speech Incoherent;Soft;Slow;Slurred  Interaction Minimal  Motor Activity Slow  Appearance/Hygiene Body odor  Behavior Characteristics Cooperative  Mood Labile  Thought Process  Coherency Circumstantial  Content Preoccupation  Delusions Paranoid  Perception Hallucinations  Hallucination Auditory (Blank stares with inappropriate laughter.)  Judgment Poor  Confusion Mild  Danger to Self  Current suicidal ideation? Denies  Danger to Others  Danger to Others None reported or observed

## 2021-03-13 NOTE — [facility] Group Notes (Signed)
The focus of this group is to help patients establish daily goals to achieve during treatment and discuss how the patient can incorporate goal setting into their daily lives to aide in recovery.  Patient stated " I need to keep a clear mind in order to stay focused." Explained to patient in order to have an accurate insight into his illness, a clear mind and staying focused is a good start. Patient is looking forward to going home.

## 2021-03-13 NOTE — Telephone Encounter (Signed)
I got a message from the treating physician in the hospital, the mother indicated that the last treatment was 8 months ago, not 8 weeks ago, this makes more sense to me.  He will be getting treatment while in the hospital.

## 2021-03-14 ENCOUNTER — Other Ambulatory Visit (HOSPITAL_COMMUNITY): Payer: Self-pay

## 2021-03-14 ENCOUNTER — Encounter (HOSPITAL_COMMUNITY): Payer: Medicare Other

## 2021-03-14 ENCOUNTER — Encounter (HOSPITAL_COMMUNITY): Payer: Self-pay

## 2021-03-14 ENCOUNTER — Other Ambulatory Visit: Payer: Self-pay | Admitting: Neurology

## 2021-03-14 DIAGNOSIS — G35 Multiple sclerosis: Secondary | ICD-10-CM

## 2021-03-14 LAB — CBC WITH DIFFERENTIAL/PLATELET
Abs Immature Granulocytes: 0.09 10*3/uL — ABNORMAL HIGH (ref 0.00–0.07)
Basophils Absolute: 0.1 10*3/uL (ref 0.0–0.1)
Basophils Relative: 1 %
Eosinophils Absolute: 0.3 10*3/uL (ref 0.0–0.5)
Eosinophils Relative: 3 %
HCT: 46.4 % (ref 39.0–52.0)
Hemoglobin: 14.1 g/dL (ref 13.0–17.0)
Immature Granulocytes: 1 %
Lymphocytes Relative: 23 %
Lymphs Abs: 2 10*3/uL (ref 0.7–4.0)
MCH: 25.7 pg — ABNORMAL LOW (ref 26.0–34.0)
MCHC: 30.4 g/dL (ref 30.0–36.0)
MCV: 84.7 fL (ref 80.0–100.0)
Monocytes Absolute: 0.7 10*3/uL (ref 0.1–1.0)
Monocytes Relative: 8 %
Neutro Abs: 5.6 10*3/uL (ref 1.7–7.7)
Neutrophils Relative %: 64 %
Platelets: 217 10*3/uL (ref 150–400)
RBC: 5.48 MIL/uL (ref 4.22–5.81)
RDW: 14.8 % (ref 11.5–15.5)
WBC: 8.8 10*3/uL (ref 4.0–10.5)
nRBC: 0 % (ref 0.0–0.2)

## 2021-03-14 LAB — GLUCOSE, CAPILLARY
Glucose-Capillary: 186 mg/dL — ABNORMAL HIGH (ref 70–99)
Glucose-Capillary: 153 mg/dL — ABNORMAL HIGH (ref 70–99)

## 2021-03-14 MED ORDER — CLOZAPINE 100 MG PO TABS
100.0000 mg | ORAL_TABLET | Freq: Every day | ORAL | Status: DC
  Administered 2021-03-14 – 2021-03-18 (×5): 100 mg via ORAL
  Filled 2021-03-14 (×7): qty 1

## 2021-03-14 MED ORDER — OCREVUS 300 MG/10ML IV SOLN
INTRAVENOUS | 0 refills | Status: DC
  Filled 2021-03-14: qty 20, 90d supply, fill #0

## 2021-03-14 MED ORDER — SODIUM CHLORIDE 0.9 % IV SOLN: 600.0000 mg | INTRAVENOUS | 0 refills | Status: DC

## 2021-03-14 NOTE — Progress Notes (Signed)
Pt continues to yell in his room and talk to himself. Pt then peeks out into the hall and stands there like nothing is happening. Pt came to med window and said he needs more medicine. Pt given nighttime Clozaril per MAR. Pt not given Colace as he c/o stomach upset and had a large bowel movement this evening. Provider made aware.

## 2021-03-14 NOTE — Progress Notes (Signed)
Pt awake most of the night. Could not be roused (3 staff members tried) for labs this morning. Moved to this evening's blood draw.

## 2021-03-14 NOTE — Progress Notes (Signed)
Pt was given PRN meds because he was being loud in his room and talking to himself. This Clinical research associate spoke with pt afterwards. Says he was having trouble going to sleep. Pt calm but suspicious. Told to let staff know if he still didn't feel better.

## 2021-03-14 NOTE — Progress Notes (Signed)
Patient ID: Brandon [name], male   DOB: [birth year], 30 y.o.   MRN: [id number] Patient agitated and yelling out in the hallway stating that people are "trying to jump me!! No one in [place] ever came up and tried to do that and y'all in here trying to do it!". Pt seemed to be delusional about people trying to fight him and was medicated with Zyprexa 10mg  for agitation. Medication effective, pt ate dinner and is currently in the day room interacting with his peers with no signs of distress. Q15 minute safety checks in place.

## 2021-03-14 NOTE — Progress Notes (Signed)
DAR Note: Patient with depressed mood and congruent affect. He was visible in the milieu interacting with peers in the hallway, and has taken all of his meds so far and denies any concerns. Pt has however been heard talking out loudly to himself in his room, and denies auditory hallucinations. Pt also denies SI/HI/VH. Q15 minute checks are in place for safety.   03/14/21 1243  Psych Admission Type (Psych Patients Only)  Admission Status Voluntary  Psychosocial Assessment  Patient Complaints None  Eye Contact Fair  Facial Expression Sullen  Affect Preoccupied  Speech Soft  Interaction Minimal  Motor Activity Slow  Appearance/Hygiene Disheveled;Poor hygiene  Behavior Characteristics Cooperative  Mood Depressed  Thought Process  Coherency Circumstantial  Content Preoccupation  Delusions Paranoid  Perception Hallucinations  Hallucination None reported or observed  Judgment Poor  Confusion None  Danger to Self  Current suicidal ideation? Denies  Danger to Others  Danger to Others None reported or observed

## 2021-03-14 NOTE — [facility] Group Notes (Signed)
The focus of this group is to help patients establish daily goals to achieve during treatment and discuss how the patient can incorporate goal setting into their daily lives to aide in recovery.  Patient participated in group but was disruptive at times. Patient stated, "I hear things in my head and ask them to go away." Praised patient for recognizing the thought and voices that are not real. Encouraged patient to continue to use those coping skills in order to have a better sense of well being.

## 2021-03-14 NOTE — Progress Notes (Signed)
Distress button activated on 400 hall. Pt was agitated and yelling after having been calm the moment before. Pt ranting about wanting to leave the facility and go home. "I want to see my kids. I don't need to be here anymore." Security, [facility] and other staff present. "I'm leaving." Pt walked towards unit doors and stood there. Pt disorganized in thought. 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 Pt begins to bang his head on the door. AC got pt to go to his room and talk with her. This Clinical research associate and other RN prepared Geodon for pt to assist with calming him down. Per MAR, Geodon given left deltoid. Explained to pt what was being done and why. Pt cooperative.

## 2021-03-14 NOTE — Group Note (Signed)
Recreation Therapy Group Note   Group Topic:Team Building  Group Date: 03/14/2021 Start Time: 0945 End Time: 1045 Facilitators: Caroll Rancher, LRT/CTRS Location:  400 Hall  Group Topic: Communication, Team Building, Problem Solving  Goal Area(s) Addresses:  Patient will effectively work with peer towards shared goal.  Patient will identify skill used to make activity successful.  Patient will identify how skills used during activity can be used to reach post d/c goals.   Activity: In team's, using 25 small pipe cleaners, patients were asked to build the tallest free standing tower possible.    Education: Pharmacist, community, Building control surveyor.    Affect/Mood: Blunted   Participation Level: Minimal   Participation Quality: Independent   Behavior: Hesitant and Reluctant   Speech/Thought Process: Incoherent and Slurred   Insight: Limited   Judgement: Limited   Modes of Intervention: Group work   Patient Response to Interventions:  Resistant    Education Outcome:  Acknowledges education and In group clarification offered    Clinical Observations/Individualized Feedback: Pt came to activity late.  When asked if he wanted to participate, pt stated no at first.  As pt watched peers creating a tower, pt became curious and wanted to try it himself.  Pt was given some pipe cleaners and attempted to complete the tower.  Pt was unable to complete the task but was proud of what he was able to accomplish.   Plan: Continue to engage patient in RT group sessions 2-3x/week.   Caroll Rancher, LRT/CTRS 03/14/2021 1:34 PM

## 2021-03-14 NOTE — [facility] Group Notes (Signed)
Adult Psychoeducational Group Note  Date:  03/14/2021 Time:  4:08 PM  Group Topic/Focus:  Developing a Wellness Toolbox:   The focus of this group is to help patients develop a "wellness toolbox" with skills and strategies to promote recovery upon discharge.  Participation Level:  Active  Participation Quality:  Intrusive  Affect:  Not Congruent  Cognitive:  Disorganized  Insight: Limited  Engagement in Group:  Distracting  Modes of Intervention:  Exploration  Additional Comments:  Patient often speaking out of turn, load inappropriate outburst.  Reymundo Poll 03/14/2021, 4:08 PM

## 2021-03-14 NOTE — Progress Notes (Addendum)
[facility] MD Progress Note  03/14/2021 11:36 AM Brandon [name]  MRN:  [id number]  Chief Complaint: Psychosis  Subjective: Patient is seen and examined today by me and later by Dr. Gasper Sells..  Patient seen walking in the hallway.  Patient went to his room politely to be assessed.  He states his mood is good and he is calm. Notable latency of thought. Eye contact better with less staring. He he states he slept well last night.  But nursing note indicated that patient slept 2.5 hours last night and was seen talking to himself loudly by staff.  He states his appetite is good and he has been eating all his meals. No SI/HI.  No auditory visual hallucinations. Asked about yesterday's episode of agitation.  Patient states the medication helped him to remain calm.  He states the medication also help with his thoughts and mood. Patient denies any pain, headache, nausea, vomiting, dizziness, abdominal pain, constipation, diarrhea.  He denies any side effects from Clozaril.  Patient is oriented to place, time and person. He knows month and year but not date.  Patient is able to tell days of the week backwards correctly.  He was able to tell me months of the year correctly with slow thinking.  He denied drooling and constipation.  Discussed that he will be sent to [facility] for infusion for MS today.  Patient verbalizes understanding. Per nursing notes-patient was given PRN Zyprexa Zydis 10 mg last night at 1:40 AM.  Per nursing staff he was loud in his room talking to himself.  He had trouble going back to sleep.  He was calm but suspicious.  Called Mom this afternoon to update about the status of infusion today. Told her that pharmacy couldn't ordered the drug because of reimbursement issues. Mom states she already got a call from the nurse at infusion center and she explained everything. Mom states she understands and will wait for him to get discharged from the hospital.   Principal Problem: Schizophrenia,  chronic with acute exacerbation (HCC) Diagnosis: Principal Problem:   Schizophrenia, chronic with acute exacerbation (HCC) Active Problems:   Obesity, Class III, BMI 40-49.9 (morbid obesity) (HCC)   Unsteady gait   Multiple sclerosis (HCC)   Schizophrenia, disorganized type (HCC)  Total Time Spent in Direct Patient Care: 20 minutes  Past Psychiatric History: See H&P  Past Medical History:  Past Medical History:  Diagnosis Date   ADHD (attention deficit hyperactivity disorder)    Bipolar 1 disorder (HCC)    Chronic back pain    Chronic constipation    Chronic neck pain    Hypertension    Multiple sclerosis (HCC) 05/20/2013   left sided weakness, dysarthria   Non-compliance    Obesity    Pulmonary embolism (HCC)    Schizophrenia (HCC)    Stroke (HCC)    left sided deficits - pt's mother denies this    Past Surgical History:  Procedure Laterality Date   NO PAST SURGERIES     None     RADIOLOGY WITH ANESTHESIA N/A 01/16/2021   Procedure: MRI WITH ANESTHESIA CERVICAL AND THORASIC SPINE WITH AND WITHOUT CONTRAST;  Surgeon: Radiologist, Medication, MD;  Location: [facility] OR;  Service: Radiology;  Laterality: N/A;   TOOTH EXTRACTION N/A 06/24/2019   Procedure: DENTAL RESTORATION/EXTRACTION OF TEETH NUMBER ONE, SIXTEEN, SEVENTEEN, NINETEEN, THIRTY-TWO;  Surgeon: Ocie Doyne, DDS;  Location: [facility] OR;  Service: Oral Surgery;  Laterality: N/A;   Family History:  Family History  Problem Relation  Age of Onset   Diabetes Mother    ADD / ADHD Brother    Family Psychiatric  History: See H&P Social History:  Social History   Substance and Sexual Activity  Alcohol Use Not Currently   Alcohol/week: 0.0 standard drinks   Comment: "A little bit"      Social History   Substance and Sexual Activity  Drug Use Not Currently   Types: Marijuana   Comment: Last used: unknown     Social History   Socioeconomic History   Marital status: Single    Spouse name: Not on file   Number of  children: 0   Years of education: 11th   Highest education level: Not on file  Occupational History   Occupation: unemployed    Employer: [employer]    Comment: Disbaled  Tobacco Use   Smoking status: Every Day    Packs/day: 0.25    Types: Cigarettes   Smokeless tobacco: Never   Tobacco comments:    2 cigarettes a day  Vaping Use   Vaping Use: Never used  Substance and Sexual Activity   Alcohol use: Not Currently    Alcohol/week: 0.0 standard drinks    Comment: "A little bit"    Drug use: Not Currently    Types: Marijuana    Comment: Last used: unknown    Sexual activity: Not on file  Other Topics Concern   Not on file  Social History Narrative   Patient lives at home with his mother.   Disabled.   Education 11 th grade .   Right handed.   Drinks caffeine occassionally   Social Determinants of Corporate investment banker Strain: Not on file  Food Insecurity: Not on file  Transportation Needs: Not on file  Physical Activity: Not on file  Stress: Not on file  Social Connections: Not on file    Sleep: Poor  Appetite:  Good  Current Medications: Current Facility-Administered Medications  Medication Dose Route Frequency Provider Last Rate Last Admin   acetaminophen (TYLENOL) tablet 650 mg  650 mg Oral Q6H PRN Rankin, Shuvon B, NP   650 mg at 03/13/21 2234   alum & mag hydroxide-simeth (MAALOX/MYLANTA) 200-200-20 MG/5ML suspension 30 mL  30 mL Oral Q4H PRN Rankin, Shuvon B, NP       amLODipine (NORVASC) tablet 5 mg  5 mg Oral Daily Nira Conn A, NP   5 mg at 03/14/21 0829   apixaban (ELIQUIS) tablet 5 mg  5 mg Oral BID Nira Conn A, NP   5 mg at 03/14/21 0830   asenapine (SAPHRIS) sublingual tablet 5 mg  5 mg Sublingual BID Karsten Ro, MD   5 mg at 03/14/21 0829   cloZAPine (CLOZARIL) tablet 75 mg  75 mg Oral QHS Karsten Ro, MD   75 mg at 03/13/21 2127   docusate sodium (COLACE) capsule 400 mg  400 mg Oral QHS Nira Conn A, NP   400 mg at 03/13/21  2127   latanoprost (XALATAN) 0.005 % ophthalmic solution 1 drop  1 drop Both Eyes QHS Nira Conn A, NP   1 drop at 03/13/21 2127   loratadine (CLARITIN) tablet 10 mg  10 mg Oral Daily Nira Conn A, NP   10 mg at 03/14/21 0830   LORazepam (ATIVAN) tablet 1 mg  1 mg Oral BID Eliseo Gum B, MD   1 mg at 03/14/21 0831   magnesium hydroxide (MILK OF MAGNESIA) suspension 30 mL  30 mL Oral  Daily PRN Rankin, Shuvon B, NP       metFORMIN (GLUCOPHAGE-XR) 24 hr tablet 500 mg  500 mg Oral BID WC Nira ConnBerry, Jason A, NP   500 mg at 03/14/21 0831   multivitamin with minerals tablet 1 tablet  1 tablet Oral Daily Nira ConnBerry, Jason A, NP   1 tablet at 03/14/21 0830   nicotine (NICODERM CQ - dosed in mg/24 hours) patch 14 mg  14 mg Transdermal Daily Nira ConnBerry, Jason A, NP   14 mg at 03/14/21 0833   OLANZapine zydis (ZYPREXA) disintegrating tablet 10 mg  10 mg Oral Q8H PRN Comer LocketSingleton, Amy E, MD   10 mg at 03/14/21 0140   And   ziprasidone (GEODON) injection 20 mg  20 mg Intramuscular PRN Comer LocketSingleton, Amy E, MD       topiramate (TOPAMAX) tablet 25 mg  25 mg Oral BID Nira ConnBerry, Jason A, NP   25 mg at 03/14/21 [id number]    Lab Results:  Results for orders placed or performed during the hospital encounter of 03/05/21 (from the past 48 hour(s))  Glucose, capillary     Status: Abnormal   Collection Time: 03/12/21 12:01 PM  Result Value Ref Range   Glucose-Capillary 191 (H) 70 - 99 mg/dL    Comment: Glucose reference range applies only to samples taken after fasting for at least 8 hours.   Comment 1 Notify RN   Glucose, capillary     Status: Abnormal   Collection Time: 03/12/21  5:18 PM  Result Value Ref Range   Glucose-Capillary 217 (H) 70 - 99 mg/dL    Comment: Glucose reference range applies only to samples taken after fasting for at least 8 hours.   Comment 1 Notify RN   Resp Panel by RT-PCR (Flu A&B, Covid) Nasopharyngeal Swab     Status: None   Collection Time: 03/13/21  4:56 AM   Specimen: Nasopharyngeal Swab;  Nasopharyngeal(NP) swabs in vial transport medium  Result Value Ref Range   SARS Coronavirus 2 by RT PCR NEGATIVE NEGATIVE    Comment: (NOTE) SARS-CoV-2 target nucleic acids are NOT DETECTED.  The SARS-CoV-2 RNA is generally detectable in upper respiratory specimens during the acute phase of infection. The lowest concentration of SARS-CoV-2 viral copies this assay can detect is 138 copies/mL. A negative result does not preclude SARS-Cov-2 infection and should not be used as the sole basis for treatment or other patient management decisions. A negative result may occur with  improper specimen collection/handling, submission of specimen other than nasopharyngeal swab, presence of viral mutation(s) within the areas targeted by this assay, and inadequate number of viral copies(<138 copies/mL). A negative result must be combined with clinical observations, patient history, and epidemiological information. The expected result is Negative.  Fact Sheet for Patients:  BloggerCourse.comhttps://www.fda.gov/media/152166/download  Fact Sheet for Healthcare Providers:  SeriousBroker.ithttps://www.fda.gov/media/152162/download  This test is no t yet approved or cleared by the [place] FDA and  has been authorized for detection and/or diagnosis of SARS-CoV-2 by FDA under an Emergency Use Authorization (EUA). This EUA will remain  in effect (meaning this test can be used) for the duration of the COVID-19 declaration under Section 564(b)(1) of the Act, 21 U.S.C.section 360bbb-3(b)(1), unless the authorization is terminated  or revoked sooner.       Influenza A by PCR NEGATIVE NEGATIVE   Influenza B by PCR NEGATIVE NEGATIVE    Comment: (NOTE) The Xpert Xpress SARS-CoV-2/FLU/RSV plus assay is intended as an aid in the diagnosis of influenza from Nasopharyngeal swab  specimens and should not be used as a sole basis for treatment. Nasal washings and aspirates are unacceptable for Xpert Xpress  SARS-CoV-2/FLU/RSV testing.  Fact Sheet for Patients: BloggerCourse.com  Fact Sheet for Healthcare Providers: SeriousBroker.it  This test is not yet approved or cleared by the [place] FDA and has been authorized for detection and/or diagnosis of SARS-CoV-2 by FDA under an Emergency Use Authorization (EUA). This EUA will remain in effect (meaning this test can be used) for the duration of the COVID-19 declaration under Section 564(b)(1) of the Act, 21 U.S.C. section 360bbb-3(b)(1), unless the authorization is terminated or revoked.  Performed at [facility], [address], [place], [place] [address]   Glucose, capillary     Status: Abnormal   Collection Time: 03/13/21  6:24 AM  Result Value Ref Range   Glucose-Capillary 132 (H) 70 - 99 mg/dL    Comment: Glucose reference range applies only to samples taken after fasting for at least 8 hours.  Glucose, capillary     Status: Abnormal   Collection Time: 03/13/21 12:11 PM  Result Value Ref Range   Glucose-Capillary 119 (H) 70 - 99 mg/dL    Comment: Glucose reference range applies only to samples taken after fasting for at least 8 hours.  Glucose, capillary     Status: Abnormal   Collection Time: 03/13/21  4:58 PM  Result Value Ref Range   Glucose-Capillary 132 (H) 70 - 99 mg/dL    Comment: Glucose reference range applies only to samples taken after fasting for at least 8 hours.  Glucose, capillary     Status: Abnormal   Collection Time: 03/14/21  5:48 AM  Result Value Ref Range   Glucose-Capillary 186 (H) 70 - 99 mg/dL    Comment: Glucose reference range applies only to samples taken after fasting for at least 8 hours.   Comment 1 Notify RN     Blood Alcohol level:  Lab Results  Component Value Date   ETH <10 03/04/2021   ETH <10 09/13/2020    Metabolic Disorder Labs: Lab Results  Component Value Date   HGBA1C 5.5 03/07/2021   MPG 111.15  03/07/2021   MPG 123 04/30/2020   Lab Results  Component Value Date   PROLACTIN 13.6 04/30/2020   Lab Results  Component Value Date   CHOL 151 03/07/2021   TRIG 154 (H) 03/07/2021   HDL 32 (L) 03/07/2021   CHOLHDL 4.7 03/07/2021   VLDL 31 03/07/2021   LDLCALC 88 03/07/2021   LDLCALC 77 04/30/2020    Physical Findings:  Musculoskeletal: Strength & Muscle Tone: within normal limits Gait & Station:  unsteady  Pt getting OT Patient leans: N/A  Psychiatric Specialty Exam:  Presentation  General Appearance: Appropriate for environment Eye Contact:Fair Speech:Slow dysarthric, mumbling Speech Volume:Decreased Handedness:Right   Mood and Affect  Mood: Euthymic Affect:Blunt   Thought Process  Thought Processes: some blocking Descriptions of Associations:Intact  Orientation:Full (Time, Place and Person)   Thought Content:Denies AVH and would not cooperate to answer questions about ideas of reference, first rank symptoms, paranoia - no delusional comments noted; is not grossly responding to internal stimuli on exam  History of Schizophrenia/Schizoaffective disorder:Yes  Duration of Psychotic Symptoms:Greater than six months  Hallucinations:Hallucinations: None   Ideas of Reference:None  Suicidal Thoughts:Suicidal Thoughts: No  Homicidal Thoughts:Homicidal Thoughts: No   Sensorium  Memory:Immediate Fair; Recent Poor; Remote Poor  Judgment:Fair - compliant with meds  Insight:lacking   Executive Functions  Concentration:Fair  Attention Span:Fair  Recall:Untested today  Fund of Knowledge:Poor  Language:Poor   Psychomotor Activity  Psychomotor Activity:Psychomotor Activity: Decreased   Assets  Assets:Desire for Improvement; Resilience; Social Support   Sleep  Sleep:6.5 hours  Physical Exam: Physical Exam Constitutional:      Appearance: He is obese.  HENT:     Head: Normocephalic and atraumatic.  Pulmonary:     Effort: Pulmonary  effort is normal.  Neurological:     Mental Status: He is alert and oriented to person, place, and time.   Review of Systems  Constitutional:  Negative for fever.  HENT:  Negative for sore throat.   Respiratory:  Negative for cough.   Cardiovascular:  Negative for chest pain.  Gastrointestinal:  Negative for constipation.  Psychiatric/Behavioral:  Negative for hallucinations and suicidal ideas. The patient is not nervous/anxious.   Blood pressure (!) 149/116, pulse 97, temperature 98.2 F (36.8 C), temperature source Oral, resp. rate (!) 22, height 5\' 11"  (1.803 m), weight (!) 145.2 kg, SpO2 91 %. Body mass index is 44.63 kg/m.   Treatment Plan Summary: Daily contact with patient to assess and evaluate symptoms and progress in treatment and Medication management Mr. Shellhammer is a 30 yr old male who presents under IVC for acute psychosis in setting of treatment resistance (mother verifies pill swallows daily). Had been on 2 antipsychotics (last haldol LAI dispensed early August, saphris 10 TID). In review of chart, has typically not responded well to most antipsychotics with documented compliance. He was previously on clozapine 2-3 years ago with good response, stopped d/t drooling per pt and mother. We discussed restarting this with his mother and pt who provided oral consent/assent to restart clozapine. We are slowly tapering him off of saphris given it has been ineffective and he has a vulnerable brain d/t MS and prior stroke. Ideally we would like to see him on clozapine as monotherapy (plus medication to ameliorate side effects if necessary) and are working towards this goal.   When he came into the hospital he appeared catatonic with posturing, mutism, staring, motor perseveration (BFRS 10). He was given 2 mg of IM ativan with response of most of these symptoms; his BFRS has been about 5 since that time. He was started on ativan 2 mg BID which has since been tapered to 1 mg BID, roughly  equivalent to home klonopin 1 mg BID. May be able to reduce ativan as we reduce saphris and LAI washes out of system. Balancing psychosis, gait instability form MS and catatonia in this patient is challenging. Given this (and MS) will not attempt to further treat catatonia to [facility] of 0 as long as patient is getting out of bed and interacting with staff (which he was not doing on admission).   Current treatment plan includes increasing clozapine about every other day with careful monitoring of side effects. Lately staff have been reporting increased disorganization and need for redirection - he was talking loudly in his room and taking to himself. Pt continue to require PRN medications lately.  Schizophrenia, Chronic with acute exacerbation.  -Continue Saphris 5 mg twice daily.  - Goal of discontinuing as Clozaril becomes therapeutic -Continue Agitation Protocol: Zyprexa/Ativan/Geodon. -Continue Clozaril 100 mg QHS, will reassess for continued upward titration  - titrate up about every other day given frail brain.  (Troponin 4 on 9/3 and CRP 1.9 on 9/3). Will repeat CBC with diff on Thursdays. Pending CBC and Trop-T for today.  - Last EKG (03/13/21) - NSR, Qtc 428  Catatonia  Have not been doing serial BFRS 2/2 pt preference. Some residual slowness noted.  -Continue Ativan 1 mg twice daily.  - topamax (incidentally on for wt loss) also tx catatonia, clozapine has data in catatonia as well.    Nicotine Dependence: -Continue Nicotine Patch 14 mg daily   HTN: -Continue Amlodipine 5 mg daily.   H/o PE -Continue Eliquis 5 mg BID   Chronic Constipation -Continue Colace 400 mg QHS   Obesity: -Continue Topamax 25 mg BID and continue Metformin 24 hr 500 mg BID as written by PCP   MS/gait disorder: - Reportedly getting Ocrevus by neurology (due 9/8) - Reached out to neurology yesterday and Pt can be transferred to [facility] hospital for infusion but got staff message this morning that they couldn't  order the ocrevus and it will need to be processed before he can go.  -Pt infusion got cancelled due to  Pharmacy not able to order medication due to reimbursement issues. Mom informed.   - Home Health OT at D/c.   Other home meds: -Continue Xalatan 0.005% 1 drop both eyes QHS -Continue Claritin 10 mg daily   -DO NOT START TRAZODONE OR GABAPENTIN AS IT WAS STOPPED BY NEUROLOGY DUE TO GAIT INSTABILITY.   -Continue PRN's: Tylenol, Maalox, Atarax, Milk of Magnesia   PGY-2 Karsten Ro, MD 03/14/2021, 11:36 AM

## 2021-03-14 NOTE — Progress Notes (Signed)
   03/14/21 2000  Psych Admission Type (Psych Patients Only)  Admission Status Voluntary  Psychosocial Assessment  Patient Complaints None  Eye Contact Brief  Facial Expression Anxious  Affect Preoccupied  Speech Soft  Interaction Minimal  Motor Activity Slow  Appearance/Hygiene Disheveled;Poor hygiene  Behavior Characteristics Guarded;Anxious  Mood Suspicious  Thought Process  Coherency Circumstantial  Content Preoccupation  Delusions Paranoid  Perception Hallucinations  Hallucination None reported or observed  Judgment Poor  Confusion None  Danger to Self  Current suicidal ideation? Denies  Danger to Others  Danger to Others None reported or observed   Pt seen in room. Pt in bathroom. Says he has stomach upset and has had a large bowel movement. Per day shift, pt sitting in dayroom passing gas and disrupting the milieu. Pt asked to return to his room. Pt denies SI, HI, AVH and pain. Pt denies anxiety and depression. Pt suspicious.

## 2021-03-14 NOTE — Progress Notes (Addendum)
Pt did not participate in the evening wrap up group. Pt was in the restroom for an extended period of time singing and talking to himself. Pt exited bathroom and suddenly became focused on leaving, angry, and banging on the doors. A STARR was called and pt eventually calmed down and received medication.

## 2021-03-15 ENCOUNTER — Other Ambulatory Visit (HOSPITAL_COMMUNITY): Payer: Self-pay

## 2021-03-15 ENCOUNTER — Encounter (HOSPITAL_COMMUNITY): Payer: Self-pay

## 2021-03-15 LAB — GLUCOSE, CAPILLARY
Glucose-Capillary: 115 mg/dL — ABNORMAL HIGH (ref 70–99)
Glucose-Capillary: 146 mg/dL — ABNORMAL HIGH (ref 70–99)
Glucose-Capillary: 222 mg/dL — ABNORMAL HIGH (ref 70–99)

## 2021-03-15 LAB — RESP PANEL BY RT-PCR (FLU A&B, COVID) ARPGX2
Influenza A by PCR: NEGATIVE
Influenza B by PCR: NEGATIVE
SARS Coronavirus 2 by RT PCR: NEGATIVE

## 2021-03-15 MED ORDER — CLOZAPINE 25 MG PO TABS
25.0000 mg | ORAL_TABLET | Freq: Once | ORAL | Status: AC
  Administered 2021-03-16: 25 mg via ORAL
  Filled 2021-03-15 (×2): qty 1

## 2021-03-15 MED ORDER — ASENAPINE MALEATE 5 MG SL SUBL: 5.0000 mg | SUBLINGUAL_TABLET | Freq: Three times a day (TID) | SUBLINGUAL | Status: DC | PRN

## 2021-03-15 MED ORDER — CLOZAPINE 25 MG PO TABS
50.0000 mg | ORAL_TABLET | Freq: Every morning | ORAL | Status: DC
  Administered 2021-03-17 – 2021-03-23 (×7): 50 mg via ORAL
  Filled 2021-03-15 (×11): qty 2

## 2021-03-15 NOTE — Group Note (Signed)
Recreation Therapy Group Note   Group Topic:Stress Management  Group Date: 03/15/2021 Start Time: 1020 End Time: 1040 Facilitators: Caroll Rancher, LRT/CTRS Location: [address] Hall Dayroom  Goal Area(s) Addresses:  Patient will review and complete packet supporting identification of stressors and and techniques to combat compounding stress.   Description:  LRT provided pt a workbook reviewing stress management concepts and offering an opportunity to create a plan to address stressors post d/c. Pt given the option to complete the packet in the dayroom with RN/MHT staff and peers or at their own pace in their room.   Affect/Mood: N/A   Participation Level: None   Participation Quality: N/A   Behavior: N/A   Speech/Thought Process: N/A   Insight: N/A   Judgement: N/A   Modes of Intervention: Independent Packet   Patient Response to Interventions:  None   Education Outcome:  None   Clinical Observations/Individualized Feedback: Pt did not receive packet due to dealing with personal hygiene issues at the time.  Plan: Continue to engage patient in RT group sessions 2-3x/week.   Caroll Rancher, LRT/CTRS 03/15/2021 1:13 PM

## 2021-03-15 NOTE — Progress Notes (Signed)
   03/15/21 1935  Psych Admission Type (Psych Patients Only)  Admission Status Involuntary  Psychosocial Assessment  Patient Complaints None  Eye Contact Brief  Facial Expression Flat  Affect Preoccupied  Speech Soft  Interaction Minimal  Motor Activity Slow  Appearance/Hygiene Improved;Poor hygiene  Behavior Characteristics Cooperative;Anxious;Guarded  Mood Preoccupied;Suspicious  Thought Process  Coherency Circumstantial  Content Preoccupation  Delusions Paranoid  Perception Hallucinations  Hallucination None reported or observed  Judgment Poor  Confusion None  Danger to Self  Current suicidal ideation? Denies  Danger to Others  Danger to Others None reported or observed   Pt seen in his room. Lying calmly on bed. Pt denies SI, HI, AVH and pain. Pt had delayed responses as if he is responding to internal stimuli. Pt denies anxiety and depression. Pt states that he was told he could be discharged Monday or Tuesday. Reiterated to pt that in order for this to happen, he needs to mind his behavior and let staff know if he is anxious. Pt verbalized understanding. Pt still yelling in his room and then coming out into the hallway like normal. Pt takes his medications as prescribed.

## 2021-03-15 NOTE — BH IP Treatment Plan (Signed)
Interdisciplinary Treatment and Diagnostic Plan Update  03/15/2021 Time of Session:  AVIAN KONIGSBERG MRN: [id number]  Principal Diagnosis: Schizophrenia, chronic with acute exacerbation (HCC)  Secondary Diagnoses: Principal Problem:   Schizophrenia, chronic with acute exacerbation (HCC) Active Problems:   Obesity, Class III, BMI 40-49.9 (morbid obesity) (HCC)   Unsteady gait   Multiple sclerosis (HCC)   Schizophrenia, disorganized type (HCC)   Current Medications:  Current Facility-Administered Medications  Medication Dose Route Frequency Provider Last Rate Last Admin   acetaminophen (TYLENOL) tablet 650 mg  650 mg Oral Q6H PRN Rankin, Shuvon B, NP   650 mg at 03/13/21 2234   alum & mag hydroxide-simeth (MAALOX/MYLANTA) 200-200-20 MG/5ML suspension 30 mL  30 mL Oral Q4H PRN Rankin, Shuvon B, NP   30 mL at 03/14/21 1651   amLODipine (NORVASC) tablet 5 mg  5 mg Oral Daily Nira Conn A, NP   5 mg at 03/15/21 0815   apixaban (ELIQUIS) tablet 5 mg  5 mg Oral BID Nira Conn A, NP   5 mg at 03/15/21 0814   asenapine (SAPHRIS) sublingual tablet 5 mg  5 mg Sublingual BID Karsten Ro, MD   5 mg at 03/15/21 0816   cloZAPine (CLOZARIL) tablet 100 mg  100 mg Oral QHS Cinderella, Margaret A   100 mg at 03/14/21 2148   docusate sodium (COLACE) capsule 400 mg  400 mg Oral QHS Nira Conn A, NP   400 mg at 03/13/21 2127   latanoprost (XALATAN) 0.005 % ophthalmic solution 1 drop  1 drop Both Eyes QHS Nira Conn A, NP   1 drop at 03/14/21 2148   loratadine (CLARITIN) tablet 10 mg  10 mg Oral Daily Nira Conn A, NP   10 mg at 03/15/21 0816   LORazepam (ATIVAN) tablet 1 mg  1 mg Oral BID Eliseo Gum B, MD   1 mg at 03/15/21 0818   magnesium hydroxide (MILK OF MAGNESIA) suspension 30 mL  30 mL Oral Daily PRN Rankin, Shuvon B, NP       metFORMIN (GLUCOPHAGE-XR) 24 hr tablet 500 mg  500 mg Oral BID WC Nira Conn A, NP   500 mg at 03/15/21 0818   multivitamin with minerals tablet 1 tablet  1  tablet Oral Daily Nira Conn A, NP   1 tablet at 03/15/21 0814   nicotine (NICODERM CQ - dosed in mg/24 hours) patch 14 mg  14 mg Transdermal Daily Nira Conn A, NP   14 mg at 03/15/21 0819   OLANZapine zydis (ZYPREXA) disintegrating tablet 10 mg  10 mg Oral Q8H PRN Comer Locket, MD   10 mg at 03/14/21 1650   topiramate (TOPAMAX) tablet 25 mg  25 mg Oral BID Nira Conn A, NP   25 mg at 03/15/21 0815   PTA Medications: Medications Prior to Admission  Medication Sig Dispense Refill Last Dose   acetaminophen (TYLENOL) 500 MG tablet Take 1 tablet (500 mg total) by mouth every 6 (six) hours as needed. (Patient taking differently: Take 500 mg by mouth every 6 (six) hours as needed for moderate pain.) 30 tablet 0    amantadine (SYMMETREL) 100 MG capsule Take 100 mg by mouth 2 (two) times daily.      amLODipine (NORVASC) 5 MG tablet Take 5 mg by mouth daily.      apixaban (ELIQUIS) 5 MG TABS tablet Take 5 mg by mouth 2 (two) times daily.       Asenapine Maleate 10 MG SUBL Place 1  tablet (10 mg total) under the tongue in the morning, at noon, and at bedtime. 90 tablet 1    clonazePAM (KLONOPIN) 0.5 MG tablet Take 0.5 mg by mouth daily as needed for anxiety.      docusate sodium (COLACE) 100 MG capsule Take 400 mg by mouth at bedtime.       haloperidol (HALDOL) 10 MG tablet Take 1 tablet (10 mg total) by mouth 2 (two) times daily. 60 tablet 0    haloperidol decanoate (HALDOL DECANOATE) 100 MG/ML injection Inject 3 mLs (300 mg total) into the muscle every 28 (twenty-eight) days. 3 mL 3    latanoprost (XALATAN) 0.005 % ophthalmic solution INSTILL 1 DROP INTO BOTH EYES EVERY NIGHT 7.5 mL 10    loratadine (CLARITIN) 10 MG tablet Take 10 mg by mouth daily.      metFORMIN (GLUCOPHAGE-XR) 500 MG 24 hr tablet Take 500 mg by mouth 2 (two) times daily with a meal.      Multiple Vitamin (MULTIVITAMIN WITH MINERALS) TABS tablet Take 1 tablet by mouth daily.      topiramate (TOPAMAX) 25 MG tablet TAKE 1  TABLET (25 MG TOTAL) BY MOUTH TWO (TWO) TIMES DAILY. (Patient taking differently: Take 25 mg by mouth 2 (two) times daily.) 60 tablet 5     Patient Stressors: Financial difficulties   Health problems   Occupational concerns    Patient Strengths: Motivation for treatment/growth  Supportive family/friends   Treatment Modalities: Medication Management, Group therapy, Case management,  1 to 1 session with clinician, Psychoeducation, Recreational therapy.   Physician Treatment Plan for Primary Diagnosis: Schizophrenia, chronic with acute exacerbation (HCC) Long Term Goal(s): Improvement in symptoms so as ready for discharge   Short Term Goals: Ability to identify changes in lifestyle to reduce recurrence of condition will improve Ability to verbalize feelings will improve Ability to identify and develop effective coping behaviors will improve Compliance with prescribed medications will improve Ability to identify triggers associated with substance abuse/mental health issues will improve  Medication Management: Evaluate patient's response, side effects, and tolerance of medication regimen.  Therapeutic Interventions: 1 to 1 sessions, Unit Group sessions and Medication administration.  Evaluation of Outcomes: Progressing  Physician Treatment Plan for Secondary Diagnosis: Principal Problem:   Schizophrenia, chronic with acute exacerbation (HCC) Active Problems:   Obesity, Class III, BMI 40-49.9 (morbid obesity) (HCC)   Unsteady gait   Multiple sclerosis (HCC)   Schizophrenia, disorganized type (HCC)  Long Term Goal(s): Improvement in symptoms so as ready for discharge   Short Term Goals: Ability to identify changes in lifestyle to reduce recurrence of condition will improve Ability to verbalize feelings will improve Ability to identify and develop effective coping behaviors will improve Compliance with prescribed medications will improve Ability to identify triggers associated with  substance abuse/mental health issues will improve     Medication Management: Evaluate patient's response, side effects, and tolerance of medication regimen.  Therapeutic Interventions: 1 to 1 sessions, Unit Group sessions and Medication administration.  Evaluation of Outcomes: Progressing   RN Treatment Plan for Primary Diagnosis: Schizophrenia, chronic with acute exacerbation (HCC) Long Term Goal(s): Knowledge of disease and therapeutic regimen to maintain health will improve  Short Term Goals: Ability to demonstrate self-control, Ability to participate in decision making will improve, and Ability to verbalize feelings will improve  Medication Management: RN will administer medications as ordered by provider, will assess and evaluate patient's response and provide education to patient for prescribed medication. RN will report any adverse and/or  side effects to prescribing provider.  Therapeutic Interventions: 1 on 1 counseling sessions, Psychoeducation, Medication administration, Evaluate responses to treatment, Monitor vital signs and CBGs as ordered, Perform/monitor CIWA, COWS, AIMS and Fall Risk screenings as ordered, Perform wound care treatments as ordered.  Evaluation of Outcomes: Progressing   LCSW Treatment Plan for Primary Diagnosis: Schizophrenia, chronic with acute exacerbation (HCC) Long Term Goal(s): Safe transition to appropriate next level of care at discharge, Engage patient in therapeutic group addressing interpersonal concerns.  Short Term Goals: Engage patient in aftercare planning with referrals and resources, Increase social support, and Increase ability to appropriately verbalize feelings  Therapeutic Interventions: Assess for all discharge needs, 1 to 1 time with Social worker, Explore available resources and support systems, Assess for adequacy in community support network, Educate family and significant other(s) on suicide prevention, Complete Psychosocial  Assessment, Interpersonal group therapy.  Evaluation of Outcomes: Progressing   Progress in Treatment: Attending groups: Yes. Participating in groups: Yes. Taking medication as prescribed: Yes. Toleration medication: Yes. Family/Significant other contact made: Yes, individual(s) contacted:  guardain Patient understands diagnosis: No. Discussing patient identified problems/goals with staff: Yes. Medical problems stabilized or resolved: Yes. Denies suicidal/homicidal ideation: Yes. Issues/concerns per patient self-inventory: No. Other: None  New problem(s) identified: No, Describe:  None  New Short Term/Long Term Goal(s):medication stabilization, elimination of SI thoughts, development of comprehensive mental wellness plan.   Patient Goals:  "to go home"  Discharge Plan or Barriers: Patient recently admitted. CSW will continue to follow and assess for appropriate referrals and possible discharge planning.   Reason for Continuation of Hospitalization: Medication stabilization  Estimated Length of Stay: 3-5 days   Scribe for Treatment Team: Chrys Racer 03/15/2021 12:45 PM

## 2021-03-15 NOTE — Progress Notes (Signed)
Adult Psychoeducational Group Note  Date:  03/15/2021 Time:  3:50 PM  Group Topic/Focus:  Recovery Goals:   The focus of this group is to identify appropriate goals for recovery and establish a plan to achieve them.  Modes of Intervention:  Education  Additional Comments:  Material for the topic of "setbacks in recovery" were passed out to the pt's. Pt's were encouraged to complete the activity and use the RN and Tech to work through it with them if need be.    Deforest Hoyles Ayan Heffington 03/15/2021, 3:50 PM

## 2021-03-15 NOTE — [facility] Group Notes (Signed)
Adult Psychoeducational Group Note  Date:  03/15/2021 Time:  9:22 AM  Group Topic/Focus:  Goals Group:   The focus of this group is to help patients establish daily goals to achieve during treatment and discuss how the patient can incorporate goal setting into their daily lives to aide in recovery.  Participation Level:  Did Not Attend  Additional Comments: When asked about goals today the pt did not respond to any prompts.   Brandon [name] Brandon [name] 03/15/2021, 9:22 AM

## 2021-03-15 NOTE — [facility] Group Notes (Signed)
Type of Therapy:  Group Therapy   Modes of Intervention:  Self Affirmations   Due to the COVID-19 pandemic, this group has been supplemented with worksheets.   Summary of Progress/Problems: CSW provided worksheet to patient due to COVID. CSW offered to meet individually with patient as needed   Tel Hevia, LCSWA Clinicial Social Worker [facility]  

## 2021-03-15 NOTE — Progress Notes (Addendum)
[facility] MD Progress Note  03/15/2021 1:12 PM Brandon [name]  MRN:  [id number]  Chief Complaint: Psychosis  Subjective: Patient is seen and examined today by Dr. Gasper Sells and me.  Patient seen in his room.  Pt was lying down and asked to sit. He sat down on the bed. He states his mood is okay. Notable mild latency of thought but he is able to have conversation. Eye contact better with less staring. He he states he slept well last night. He states his appetite is good and he has been eating all his meals. No SI/HI.  Patient reports auditory hallucinations that he hears music then say that he doesn't have auditory hallucinations and wants to go home. Asked about yesterday's episode of agitation.  Pt states "one of the staff was saying that she want to be with me but I want to be all by myself ". Patient states he is ok now and feels calm. Patient denies any pain, headache, nausea, vomiting, dizziness, abdominal pain, constipation, diarrhea.  He denies any side effects from Clozaril.  Patient is oriented to place, time and person. He knows month and year but not date.  Patient is able to tell days of the week and months backwards correctly with lot of thinking with Months of year backwards. He denied drooling and constipation.   Pt states he wants to go home. Discussed that the won't be discharged soon. He irritable states he is angry because he is not able to go back to home. Per nursing notes-patient stated that he is hearing things and asking them to go away.  At 6:49 PM patient was agitated and yelling out in the hallway stating that "people are trying to jump me".  Patient was delusional about people trying to fight him.  Patient was given as needed Zyprexa 10 mg for agitation.  At 8:45 p.m. distress button was activated as patient was yelling and agitated in the hallway.  Patient stated that he wants to go home and wanted to see his kids.  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  Patient received Geodon for agitation.  Later Pt was seen yelling in his room and talking to himself.   Principal Problem: Schizophrenia, chronic with acute exacerbation (HCC) Diagnosis: Principal Problem:   Schizophrenia, chronic with acute exacerbation (HCC) Active Problems:   Obesity, Class III, BMI 40-49.9 (morbid obesity) (HCC)   Unsteady gait   Multiple sclerosis (HCC)   Schizophrenia, disorganized type (HCC)  Total Time Spent in Direct Patient Care: 20 minutes  Past Psychiatric History: See H&P  Past Medical History:  Past Medical History:  Diagnosis Date   ADHD (attention deficit hyperactivity disorder)    Bipolar 1 disorder (HCC)    Chronic back pain    Chronic constipation    Chronic neck pain    Hypertension    Multiple sclerosis (HCC) 05/20/2013   left sided weakness, dysarthria   Non-compliance    Obesity    Pulmonary embolism (HCC)    Schizophrenia (HCC)    Stroke (HCC)    left sided deficits - pt's mother denies this    Past Surgical History:  Procedure Laterality Date   NO PAST SURGERIES     None     RADIOLOGY WITH ANESTHESIA N/A 01/16/2021   Procedure: MRI WITH ANESTHESIA CERVICAL AND THORASIC SPINE WITH AND WITHOUT CONTRAST;  Surgeon: Radiologist, Medication, MD;  Location: [facility] OR;  Service: Radiology;  Laterality:  N/A;   TOOTH EXTRACTION N/A 06/24/2019   Procedure: DENTAL RESTORATION/EXTRACTION OF TEETH NUMBER ONE, SIXTEEN, SEVENTEEN, NINETEEN, THIRTY-TWO;  Surgeon: Ocie Doyne, DDS;  Location: [facility] OR;  Service: Oral Surgery;  Laterality: N/A;   Family History:  Family History  Problem Relation Age of Onset   Diabetes Mother    ADD / ADHD Brother    Family Psychiatric  History: See H&P Social History:  Social History   Substance and Sexual Activity  Alcohol Use Not Currently   Alcohol/week: 0.0 standard drinks   Comment: "A little bit"      Social History   Substance and Sexual Activity  Drug Use Not  Currently   Types: Marijuana   Comment: Last used: unknown     Social History   Socioeconomic History   Marital status: Single    Spouse name: Not on file   Number of children: 0   Years of education: 11th   Highest education level: Not on file  Occupational History   Occupation: unemployed    Employer: [employer]    Comment: Disbaled  Tobacco Use   Smoking status: Every Day    Packs/day: 0.25    Types: Cigarettes   Smokeless tobacco: Never   Tobacco comments:    2 cigarettes a day  Vaping Use   Vaping Use: Never used  Substance and Sexual Activity   Alcohol use: Not Currently    Alcohol/week: 0.0 standard drinks    Comment: "A little bit"    Drug use: Not Currently    Types: Marijuana    Comment: Last used: unknown    Sexual activity: Not on file  Other Topics Concern   Not on file  Social History Narrative   Patient lives at home with his mother.   Disabled.   Education 11 th grade .   Right handed.   Drinks caffeine occassionally   Social Determinants of Corporate investment banker Strain: Not on file  Food Insecurity: Not on file  Transportation Needs: Not on file  Physical Activity: Not on file  Stress: Not on file  Social Connections: Not on file    Sleep: Fair  Appetite:  Good  Current Medications: Current Facility-Administered Medications  Medication Dose Route Frequency Provider Last Rate Last Admin   acetaminophen (TYLENOL) tablet 650 mg  650 mg Oral Q6H PRN Rankin, Shuvon B, NP   650 mg at 03/13/21 2234   alum & mag hydroxide-simeth (MAALOX/MYLANTA) 200-200-20 MG/5ML suspension 30 mL  30 mL Oral Q4H PRN Rankin, Shuvon B, NP   30 mL at 03/14/21 1651   amLODipine (NORVASC) tablet 5 mg  5 mg Oral Daily Nira Conn A, NP   5 mg at 03/15/21 0815   apixaban (ELIQUIS) tablet 5 mg  5 mg Oral BID Nira Conn A, NP   5 mg at 03/15/21 0814   asenapine (SAPHRIS) sublingual tablet 5 mg  5 mg Sublingual BID Karsten Ro, MD   5 mg at 03/15/21 0816    cloZAPine (CLOZARIL) tablet 100 mg  100 mg Oral QHS Camary Sosa A   100 mg at 03/14/21 2148   docusate sodium (COLACE) capsule 400 mg  400 mg Oral QHS Nira Conn A, NP   400 mg at 03/13/21 2127   latanoprost (XALATAN) 0.005 % ophthalmic solution 1 drop  1 drop Both Eyes QHS Nira Conn A, NP   1 drop at 03/14/21 2148   loratadine (CLARITIN) tablet 10 mg  10 mg Oral  Daily Nira Conn A, NP   10 mg at 03/15/21 0816   LORazepam (ATIVAN) tablet 1 mg  1 mg Oral BID Eliseo Gum B, MD   1 mg at 03/15/21 0818   magnesium hydroxide (MILK OF MAGNESIA) suspension 30 mL  30 mL Oral Daily PRN Rankin, Shuvon B, NP       metFORMIN (GLUCOPHAGE-XR) 24 hr tablet 500 mg  500 mg Oral BID WC Nira Conn A, NP   500 mg at 03/15/21 0818   multivitamin with minerals tablet 1 tablet  1 tablet Oral Daily Nira Conn A, NP   1 tablet at 03/15/21 0814   nicotine (NICODERM CQ - dosed in mg/24 hours) patch 14 mg  14 mg Transdermal Daily Nira Conn A, NP   14 mg at 03/15/21 0819   OLANZapine zydis (ZYPREXA) disintegrating tablet 10 mg  10 mg Oral Q8H PRN Comer Locket, MD   10 mg at 03/14/21 1650   topiramate (TOPAMAX) tablet 25 mg  25 mg Oral BID Nira Conn A, NP   25 mg at 03/15/21 9476    Lab Results:  Results for orders placed or performed during the hospital encounter of 03/05/21 (from the past 48 hour(s))  Glucose, capillary     Status: Abnormal   Collection Time: 03/13/21  4:58 PM  Result Value Ref Range   Glucose-Capillary 132 (H) 70 - 99 mg/dL    Comment: Glucose reference range applies only to samples taken after fasting for at least 8 hours.  Glucose, capillary     Status: Abnormal   Collection Time: 03/14/21  5:48 AM  Result Value Ref Range   Glucose-Capillary 186 (H) 70 - 99 mg/dL    Comment: Glucose reference range applies only to samples taken after fasting for at least 8 hours.   Comment 1 Notify RN   Glucose, capillary     Status: Abnormal   Collection Time: 03/14/21  5:24 PM   Result Value Ref Range   Glucose-Capillary 153 (H) 70 - 99 mg/dL    Comment: Glucose reference range applies only to samples taken after fasting for at least 8 hours.  CBC with Differential/Platelet     Status: Abnormal   Collection Time: 03/14/21  6:47 PM  Result Value Ref Range   WBC 8.8 4.0 - 10.5 K/uL   RBC 5.48 4.22 - 5.81 MIL/uL   Hemoglobin 14.1 13.0 - 17.0 g/dL   HCT 54.6 50.3 - 54.6 %   MCV 84.7 80.0 - 100.0 fL   MCH 25.7 (L) 26.0 - 34.0 pg   MCHC 30.4 30.0 - 36.0 g/dL   RDW 56.8 12.7 - 51.7 %   Platelets 217 150 - 400 K/uL   nRBC 0.0 0.0 - 0.2 %   Neutrophils Relative % 64 %   Neutro Abs 5.6 1.7 - 7.7 K/uL   Lymphocytes Relative 23 %   Lymphs Abs 2.0 0.7 - 4.0 K/uL   Monocytes Relative 8 %   Monocytes Absolute 0.7 0.1 - 1.0 K/uL   Eosinophils Relative 3 %   Eosinophils Absolute 0.3 0.0 - 0.5 K/uL   Basophils Relative 1 %   Basophils Absolute 0.1 0.0 - 0.1 K/uL   Immature Granulocytes 1 %   Abs Immature Granulocytes 0.09 (H) 0.00 - 0.07 K/uL    Comment: Performed at [facility], [address], [place], [place] [address]  Resp Panel by RT-PCR (Flu A&B, Covid) Nasopharyngeal Swab     Status: None  Collection Time: 03/15/21  3:17 AM   Specimen: Nasopharyngeal Swab; Nasopharyngeal(NP) swabs in vial transport medium  Result Value Ref Range   SARS Coronavirus 2 by RT PCR NEGATIVE NEGATIVE    Comment: (NOTE) SARS-CoV-2 target nucleic acids are NOT DETECTED.  The SARS-CoV-2 RNA is generally detectable in upper respiratory specimens during the acute phase of infection. The lowest concentration of SARS-CoV-2 viral copies this assay can detect is 138 copies/mL. A negative result does not preclude SARS-Cov-2 infection and should not be used as the sole basis for treatment or other patient management decisions. A negative result may occur with  improper specimen collection/handling, submission of specimen other than nasopharyngeal swab, presence of  viral mutation(s) within the areas targeted by this assay, and inadequate number of viral copies(<138 copies/mL). A negative result must be combined with clinical observations, patient history, and epidemiological information. The expected result is Negative.  Fact Sheet for Patients:  BloggerCourse.com  Fact Sheet for Healthcare Providers:  SeriousBroker.it  This test is no t yet approved or cleared by the [place] FDA and  has been authorized for detection and/or diagnosis of SARS-CoV-2 by FDA under an Emergency Use Authorization (EUA). This EUA will remain  in effect (meaning this test can be used) for the duration of the COVID-19 declaration under Section 564(b)(1) of the Act, 21 U.S.C.section 360bbb-3(b)(1), unless the authorization is terminated  or revoked sooner.       Influenza A by PCR NEGATIVE NEGATIVE   Influenza B by PCR NEGATIVE NEGATIVE    Comment: (NOTE) The Xpert Xpress SARS-CoV-2/FLU/RSV plus assay is intended as an aid in the diagnosis of influenza from Nasopharyngeal swab specimens and should not be used as a sole basis for treatment. Nasal washings and aspirates are unacceptable for Xpert Xpress SARS-CoV-2/FLU/RSV testing.  Fact Sheet for Patients: BloggerCourse.com  Fact Sheet for Healthcare Providers: SeriousBroker.it  This test is not yet approved or cleared by the [place] FDA and has been authorized for detection and/or diagnosis of SARS-CoV-2 by FDA under an Emergency Use Authorization (EUA). This EUA will remain in effect (meaning this test can be used) for the duration of the COVID-19 declaration under Section 564(b)(1) of the Act, 21 U.S.C. section 360bbb-3(b)(1), unless the authorization is terminated or revoked.  Performed at [facility], [address], [place], [place] [address]   Glucose, capillary      Status: Abnormal   Collection Time: 03/15/21  5:49 AM  Result Value Ref Range   Glucose-Capillary 115 (H) 70 - 99 mg/dL    Comment: Glucose reference range applies only to samples taken after fasting for at least 8 hours.   Comment 1 Notify RN    Comment 2 Document in Chart   Glucose, capillary     Status: Abnormal   Collection Time: 03/15/21 11:53 AM  Result Value Ref Range   Glucose-Capillary 146 (H) 70 - 99 mg/dL    Comment: Glucose reference range applies only to samples taken after fasting for at least 8 hours.   Comment 1 Notify RN    Comment 2 Document in Chart     Blood Alcohol level:  Lab Results  Component Value Date   ETH <10 03/04/2021   ETH <10 09/13/2020    Metabolic Disorder Labs: Lab Results  Component Value Date   HGBA1C 5.5 03/07/2021   MPG 111.15 03/07/2021   MPG 123 04/30/2020   Lab Results  Component Value Date   PROLACTIN 13.6 04/30/2020   Lab  Results  Component Value Date   CHOL 151 03/07/2021   TRIG 154 (H) 03/07/2021   HDL 32 (L) 03/07/2021   CHOLHDL 4.7 03/07/2021   VLDL 31 03/07/2021   LDLCALC 88 03/07/2021   LDLCALC 77 04/30/2020    Physical Findings:  Musculoskeletal: Strength & Muscle Tone: within normal limits Gait & Station:  unsteady  Pt getting OT Patient leans: N/A  Psychiatric Specialty Exam:  Presentation  General Appearance: Appropriate for environment Eye Contact:Fair Speech:Slow dysarthric, mumbling Speech Volume:Normal Handedness:Right   Mood and Affect  Mood: Euthymic Affect:Blunt   Thought Process  Thought Processes: some blocking Descriptions of Associations:Intact  Orientation:Full (Time, Place and Person)   Thought Content: Reports hearing music and then denies AVH. He would not cooperate to answer questions about ideas of reference, first rank symptoms, paranoia - no delusional comments noted; is not grossly responding to internal stimuli on exam. Pt was seen by staff talking to himself loudly  multiple times.   History of Schizophrenia/Schizoaffective disorder:Yes  Duration of Psychotic Symptoms:Greater than six months  Hallucinations:Hallucinations: Auditory   Ideas of Reference:Delusions  Suicidal Thoughts:Suicidal Thoughts: No  Homicidal Thoughts:Homicidal Thoughts: No   Sensorium  Memory:Immediate Fair; Recent Poor; Remote Poor  Judgment:Fair - compliant with meds  Insight:lacking   Executive Functions  Concentration:Fair  Attention Span:Fair  Recall:Untested today  Fund of Knowledge:Poor  Language:Poor   Psychomotor Activity  Psychomotor Activity:Psychomotor Activity: Decreased   Assets  Assets:Desire for Improvement; Resilience; Social Support   Sleep  Sleep:6.5 hours  Physical Exam: Physical Exam Constitutional:      Appearance: He is obese.  HENT:     Head: Normocephalic and atraumatic.  Pulmonary:     Effort: Pulmonary effort is normal.  Neurological:     Mental Status: He is alert and oriented to person, place, and time.   Review of Systems  Constitutional:  Negative for fever.  HENT:  Negative for sore throat.   Respiratory:  Negative for cough.   Cardiovascular:  Negative for chest pain.  Gastrointestinal:  Negative for constipation.  Psychiatric/Behavioral:  Negative for hallucinations and suicidal ideas. The patient is not nervous/anxious.   Blood pressure (!) 137/106, pulse (!) 119, temperature 97.7 F (36.5 C), temperature source Oral, resp. rate (!) 22, height 5\' 11"  (1.803 m), weight (!) 145.2 kg, SpO2 96 %. Body mass index is 44.63 kg/m.   Treatment Plan Summary: Daily contact with patient to assess and evaluate symptoms and progress in treatment and Medication management Mr. Wolman is a 30 yr old male who presents under IVC for acute psychosis in setting of treatment resistance (mother verifies pill swallows daily). Had been on 2 antipsychotics (last haldol LAI dispensed early August, saphris 10 TID). In review of  chart, has typically not responded well to most antipsychotics with documented compliance. He was previously on clozapine 2-3 years ago with good response, stopped d/t drooling per pt and mother. We discussed restarting this with his mother and pt who provided oral consent/assent to restart clozapine. We are slowly tapering him off of saphris given it has been ineffective and he has a vulnerable brain d/t MS and prior stroke. Ideally we would like to see him on clozapine as monotherapy (plus medication to ameliorate side effects if necessary) and are working towards this goal.  When he came into the hospital he appeared catatonic with posturing, mutism, staring, motor perseveration (BFRS 10). He was given 2 mg of IM ativan with response of most of these symptoms; his BFRS  has been about 5 since that time. He was started on ativan 2 mg BID which has since been tapered to 1 mg BID, roughly equivalent to home klonopin 1 mg BID. May be able to reduce ativan as we reduce saphris and LAI washes out of system. Balancing psychosis, gait instability form MS and catatonia in this patient is challenging. Given this (and MS) will not attempt to further treat [facility] as long as patient is getting out of bed and interacting with staff (which he was not doing on admission).   Current treatment plan includes increasing clozapine about every other day with careful monitoring of side effects. Lately staff have been reporting increased disorganization and need for redirection - he was agitated, banging head, delusional and talking to himself. Pt continue to require PRN medications lately. We are transitioning his PRN to saphris (similar peak plasma level to current zyprexa, pt has not been refusing PO meds) to reduce antipsychotic polypharmacy   Schizophrenia, Chronic with acute exacerbation.  -Continue Saphris 5 mg twice daily.  - Goal of discontinuing as Clozaril becomes therapeutic -Continue Agitation  Protocol: Zyprexa/Ativan/Geodon. -Continue Clozaril 100 mg QHS, will reassess for continued upward titration  - plan for adding day time Clozaril 25 mg  tomorrow and titrating to 50 mg. Increasing by 25 mg per day now as pt has been tolerating multiple PRN of zyprexa per day.  (Troponin 4 on 9/3 and CRP 1.9 on 9/3). Will repeat CBC with diff on Thursdays. Last CBC wnl  - Last EKG (03/13/21) - NSR, Qtc 428. Will repeat EKG today given that he received multiple PRN antipsychotics received yesterday.  Catatonia  Have not been doing serial BFRS 2/2 pt preference. Some residual slowness noted.  -Continue Ativan 1 mg twice daily.  - topamax (incidentally on for wt loss) also tx catatonia, clozapine has data in catatonia as well.    Nicotine Dependence: -Continue Nicotine Patch 14 mg daily   HTN: -Continue Amlodipine 5 mg daily.   H/o PE -Continue Eliquis 5 mg BID   Chronic Constipation -Continue Colace 400 mg QHS   Obesity: -Continue Topamax 25 mg BID and continue Metformin 24 hr 500 mg BID as written by PCP   MS/gait disorder: - Reportedly getting Ocrevus by neurology was due on 9/8. -Pt infusion got cancelled due to  Pharmacy not able to order medication due to reimbursement issues. Mom informed.   - Home Health OT at D/c.   Other home meds: -Continue Xalatan 0.005% 1 drop both eyes QHS -Continue Claritin 10 mg daily   -DO NOT START TRAZODONE OR GABAPENTIN AS IT WAS STOPPED BY NEUROLOGY DUE TO GAIT INSTABILITY.   -Continue PRN's: Tylenol, Maalox, Atarax, Milk of Magnesia   PGY-2 Karsten RoVandana  Doda, MD 03/15/2021, 1:12 PM

## 2021-03-15 NOTE — Progress Notes (Signed)
   03/15/21 1000  Psych Admission Type (Psych Patients Only)  Admission Status Involuntary  Psychosocial Assessment  Patient Complaints None  Eye Contact Brief  Facial Expression Anxious  Affect Preoccupied  Speech Soft  Interaction Minimal  Motor Activity Slow  Appearance/Hygiene Disheveled;Poor hygiene  Behavior Characteristics Appropriate to situation  Mood Preoccupied;Suspicious  Thought Process  Coherency Circumstantial  Content Preoccupation  Delusions Paranoid  Perception Hallucinations  Hallucination None reported or observed  Judgment Poor  Confusion None  Danger to Self  Current suicidal ideation? Denies  Danger to Others  Danger to Others None reported or observed  Dar Note: Patient presents with anxious affect and mood.  Denies suicidal thoughts, auditory and visual hallucinations.  Patient preoccupied with getting discharge.  Medications given as prescribed.  Continues to need supervision and constant reminder for his hygiene.  Routine safety checks maintained.  Patient is safe on the unit.

## 2021-03-16 DIAGNOSIS — F209 Schizophrenia, unspecified: Secondary | ICD-10-CM

## 2021-03-16 LAB — GLUCOSE, CAPILLARY
Glucose-Capillary: 133 mg/dL — ABNORMAL HIGH (ref 70–99)
Glucose-Capillary: 124 mg/dL — ABNORMAL HIGH (ref 70–99)

## 2021-03-16 LAB — TROPONIN T: Troponin T (Highly Sensitive): 7 ng/L (ref 0–22)

## 2021-03-16 NOTE — Progress Notes (Signed)
Pt has been visible in the milieu, been observed walking back and forth btn his room and dayroom. Pt also has been actively responding to internal stimuli, has been carry conversation, sometime shouting very loud. Pt encouraged to lower his voice as there were other patients on the unit. Pt took his evening meds, went to bed, but continued to make noice before falling a sleep. Will continue to monitor.

## 2021-03-16 NOTE — Progress Notes (Addendum)
[facility] MD Progress Note  03/16/2021 2:50 PM Brandon [name]  MRN:  [id number]  Chief Complaint: Psychosis-hx Schizophrenia, disorganized type  Subjective: " I am ok, will be going to my mama house on the 13 th"  Objective note:  Report received, Nursing note reviewed and care reviewed with members of our interdisciplinary team.  Patient was seen awake in his room and his room was smelling urine and sticky on the floor.  His bed was stripped as well.  He reported that he is going home next week to his mother.  He did not discuss the reason he came in the hospital in the first place.  He stated that he is doing well and that he has been taking his medications.  He reported good sleep and appetite. Nursing reported that patient was yelling out in his room yesterday evening but then comes out to the hall way appearing normal.  He also urinated on himself and came out wet yesterday.. Today he has been voiding on himself and stated somebody is voiding on him.  He told this provider that one of his medications that starts with a "C" is making him sleepy and he voids on himself.  Patient is on Clozaril  and he reported that Clozaril makes him sleepy.  He is also taking Saphris three times daily.  He denied SI/HI/AVH and he is calm and cooperative and has not needed PRN medications for concerning behavior except for the incontinence.  We will continue to monitor and possibly consider tapering off  Saphris and going up on Clozaril. We will continue to monitor patient.  Based on today's assessment and report patient is not ready to go home yet.   Principal Problem: Schizophrenia, chronic with acute exacerbation (HCC) Diagnosis: Principal Problem:   Schizophrenia, chronic with acute exacerbation (HCC) Active Problems:   Obesity, Class III, BMI 40-49.9 (morbid obesity) (HCC)   Unsteady gait   Multiple sclerosis (HCC)   Schizophrenia, disorganized type (HCC)  Total Time Spent in Direct Patient Care: 25  minutes  Past Psychiatric History: See H&P  Past Medical History:  Past Medical History:  Diagnosis Date   ADHD (attention deficit hyperactivity disorder)    Bipolar 1 disorder (HCC)    Chronic back pain    Chronic constipation    Chronic neck pain    Hypertension    Multiple sclerosis (HCC) 05/20/2013   left sided weakness, dysarthria   Non-compliance    Obesity    Pulmonary embolism (HCC)    Schizophrenia (HCC)    Stroke (HCC)    left sided deficits - pt's mother denies this    Past Surgical History:  Procedure Laterality Date   NO PAST SURGERIES     None     RADIOLOGY WITH ANESTHESIA N/A 01/16/2021   Procedure: MRI WITH ANESTHESIA CERVICAL AND THORASIC SPINE WITH AND WITHOUT CONTRAST;  Surgeon: Radiologist, Medication, MD;  Location: [facility] OR;  Service: Radiology;  Laterality: N/A;   TOOTH EXTRACTION N/A 06/24/2019   Procedure: DENTAL RESTORATION/EXTRACTION OF TEETH NUMBER ONE, SIXTEEN, SEVENTEEN, NINETEEN, THIRTY-TWO;  Surgeon: Ocie Doyne, DDS;  Location: [facility] OR;  Service: Oral Surgery;  Laterality: N/A;   Family History:  Family History  Problem Relation Age of Onset   Diabetes Mother    ADD / ADHD Brother    Family Psychiatric  History: See H&P Social History:  Social History   Substance and Sexual Activity  Alcohol Use Not Currently   Alcohol/week: 0.0 standard drinks   Comment: "  A little bit"      Social History   Substance and Sexual Activity  Drug Use Not Currently   Types: Marijuana   Comment: Last used: unknown     Social History   Socioeconomic History   Marital status: Single    Spouse name: Not on file   Number of children: 0   Years of education: 11th   Highest education level: Not on file  Occupational History   Occupation: unemployed    Employer: [employer]    Comment: Disbaled  Tobacco Use   Smoking status: Every Day    Packs/day: 0.25    Types: Cigarettes   Smokeless tobacco: Never   Tobacco comments:    2 cigarettes a day   Vaping Use   Vaping Use: Never used  Substance and Sexual Activity   Alcohol use: Not Currently    Alcohol/week: 0.0 standard drinks    Comment: "A little bit"    Drug use: Not Currently    Types: Marijuana    Comment: Last used: unknown    Sexual activity: Not on file  Other Topics Concern   Not on file  Social History Narrative   Patient lives at home with his mother.   Disabled.   Education 11 th grade .   Right handed.   Drinks caffeine occassionally   Social Determinants of Corporate investment banker Strain: Not on file  Food Insecurity: Not on file  Transportation Needs: Not on file  Physical Activity: Not on file  Stress: Not on file  Social Connections: Not on file    Sleep: Fair  Appetite:  Good  Current Medications: Current Facility-Administered Medications  Medication Dose Route Frequency Provider Last Rate Last Admin   acetaminophen (TYLENOL) tablet 650 mg  650 mg Oral Q6H PRN Rankin, Shuvon B, NP   650 mg at 03/13/21 2234   alum & mag hydroxide-simeth (MAALOX/MYLANTA) 200-200-20 MG/5ML suspension 30 mL  30 mL Oral Q4H PRN Rankin, Shuvon B, NP   30 mL at 03/14/21 1651   amLODipine (NORVASC) tablet 5 mg  5 mg Oral Daily Nira Conn A, NP   5 mg at 03/16/21 1041   apixaban (ELIQUIS) tablet 5 mg  5 mg Oral BID Nira Conn A, NP   5 mg at 03/16/21 1042   asenapine (SAPHRIS) sublingual tablet 5 mg  5 mg Sublingual BID Karsten Ro, MD   5 mg at 03/16/21 1041   asenapine (SAPHRIS) sublingual tablet 5 mg  5 mg Sublingual TID PRN Karsten Ro, MD       cloZAPine (CLOZARIL) tablet 100 mg  100 mg Oral QHS Cinderella, Margaret A   100 mg at 03/15/21 2131   [START ON 03/17/2021] cloZAPine (CLOZARIL) tablet 50 mg  50 mg Oral q morning Cinderella, Margaret A       docusate sodium (COLACE) capsule 400 mg  400 mg Oral QHS Nira Conn A, NP   400 mg at 03/15/21 2131   latanoprost (XALATAN) 0.005 % ophthalmic solution 1 drop  1 drop Both Eyes QHS Nira Conn A, NP   1  drop at 03/15/21 2131   loratadine (CLARITIN) tablet 10 mg  10 mg Oral Daily Nira Conn A, NP   10 mg at 03/16/21 1042   LORazepam (ATIVAN) tablet 1 mg  1 mg Oral BID Eliseo Gum B, MD   1 mg at 03/16/21 1046   magnesium hydroxide (MILK OF MAGNESIA) suspension 30 mL  30 mL Oral Daily  PRN Rankin, Shuvon B, NP       metFORMIN (GLUCOPHAGE-XR) 24 hr tablet 500 mg  500 mg Oral BID WC Nira Conn A, NP   500 mg at 03/16/21 1041   multivitamin with minerals tablet 1 tablet  1 tablet Oral Daily Nira Conn A, NP   1 tablet at 03/16/21 1042   nicotine (NICODERM CQ - dosed in mg/24 hours) patch 14 mg  14 mg Transdermal Daily Nira Conn A, NP   14 mg at 03/16/21 1045   topiramate (TOPAMAX) tablet 25 mg  25 mg Oral BID Nira Conn A, NP   25 mg at 03/16/21 1042    Lab Results:  Results for orders placed or performed during the hospital encounter of 03/05/21 (from the past 48 hour(s))  Glucose, capillary     Status: Abnormal   Collection Time: 03/14/21  5:24 PM  Result Value Ref Range   Glucose-Capillary 153 (H) 70 - 99 mg/dL    Comment: Glucose reference range applies only to samples taken after fasting for at least 8 hours.  CBC with Differential/Platelet     Status: Abnormal   Collection Time: 03/14/21  6:47 PM  Result Value Ref Range   WBC 8.8 4.0 - 10.5 K/uL   RBC 5.48 4.22 - 5.81 MIL/uL   Hemoglobin 14.1 13.0 - 17.0 g/dL   HCT 33.2 95.1 - 88.4 %   MCV 84.7 80.0 - 100.0 fL   MCH 25.7 (L) 26.0 - 34.0 pg   MCHC 30.4 30.0 - 36.0 g/dL   RDW 16.6 06.3 - 01.6 %   Platelets 217 150 - 400 K/uL   nRBC 0.0 0.0 - 0.2 %   Neutrophils Relative % 64 %   Neutro Abs 5.6 1.7 - 7.7 K/uL   Lymphocytes Relative 23 %   Lymphs Abs 2.0 0.7 - 4.0 K/uL   Monocytes Relative 8 %   Monocytes Absolute 0.7 0.1 - 1.0 K/uL   Eosinophils Relative 3 %   Eosinophils Absolute 0.3 0.0 - 0.5 K/uL   Basophils Relative 1 %   Basophils Absolute 0.1 0.0 - 0.1 K/uL   Immature Granulocytes 1 %   Abs Immature  Granulocytes 0.09 (H) 0.00 - 0.07 K/uL    Comment: Performed at [facility], [address] [place], [place] [address]  Troponin T     Status: None   Collection Time: 03/14/21  6:47 PM  Result Value Ref Range   Troponin T (Highly Sensitive) 7 0 - 22 ng/L    Comment: (NOTE) In order to distinguish acute elevations of high sensitive Troponin from other clinical conditions, the Universal Definition of myocardial infarction stresses clinical assessment and the need for serial measurements to observe a rise and/or fall above the upper limit of the reference interval. Performed At: [facility] [address] [place], [place] [id number] Jolene Schimke MD UR:[id number]   Resp Panel by RT-PCR (Flu A&B, Covid) Nasopharyngeal Swab     Status: None   Collection Time: 03/15/21  3:17 AM   Specimen: Nasopharyngeal Swab; Nasopharyngeal(NP) swabs in vial transport medium  Result Value Ref Range   SARS Coronavirus 2 by RT PCR NEGATIVE NEGATIVE    Comment: (NOTE) SARS-CoV-2 target nucleic acids are NOT DETECTED.  The SARS-CoV-2 RNA is generally detectable in upper respiratory specimens during the acute phase of infection. The lowest concentration of SARS-CoV-2 viral copies this assay can detect is 138 copies/mL. A negative result does not preclude SARS-Cov-2 infection and should not  be used as the sole basis for treatment or other patient management decisions. A negative result may occur with  improper specimen collection/handling, submission of specimen other than nasopharyngeal swab, presence of viral mutation(s) within the areas targeted by this assay, and inadequate number of viral copies(<138 copies/mL). A negative result must be combined with clinical observations, patient history, and epidemiological information. The expected result is Negative.  Fact Sheet for Patients:  BloggerCourse.com  Fact Sheet for Healthcare Providers:   SeriousBroker.it  This test is no t yet approved or cleared by the [place] FDA and  has been authorized for detection and/or diagnosis of SARS-CoV-2 by FDA under an Emergency Use Authorization (EUA). This EUA will remain  in effect (meaning this test can be used) for the duration of the COVID-19 declaration under Section 564(b)(1) of the Act, 21 U.S.C.section 360bbb-3(b)(1), unless the authorization is terminated  or revoked sooner.       Influenza A by PCR NEGATIVE NEGATIVE   Influenza B by PCR NEGATIVE NEGATIVE    Comment: (NOTE) The Xpert Xpress SARS-CoV-2/FLU/RSV plus assay is intended as an aid in the diagnosis of influenza from Nasopharyngeal swab specimens and should not be used as a sole basis for treatment. Nasal washings and aspirates are unacceptable for Xpert Xpress SARS-CoV-2/FLU/RSV testing.  Fact Sheet for Patients: BloggerCourse.com  Fact Sheet for Healthcare Providers: SeriousBroker.it  This test is not yet approved or cleared by the [place] FDA and has been authorized for detection and/or diagnosis of SARS-CoV-2 by FDA under an Emergency Use Authorization (EUA). This EUA will remain in effect (meaning this test can be used) for the duration of the COVID-19 declaration under Section 564(b)(1) of the Act, 21 U.S.C. section 360bbb-3(b)(1), unless the authorization is terminated or revoked.  Performed at [facility], [address] [place], [place] [address]   Glucose, capillary     Status: Abnormal   Collection Time: 03/15/21  5:49 AM  Result Value Ref Range   Glucose-Capillary 115 (H) 70 - 99 mg/dL    Comment: Glucose reference range applies only to samples taken after fasting for at least 8 hours.   Comment 1 Notify RN    Comment 2 Document in Chart   Glucose, capillary     Status: Abnormal   Collection Time: 03/15/21 11:53 AM  Result Value Ref  Range   Glucose-Capillary 146 (H) 70 - 99 mg/dL    Comment: Glucose reference range applies only to samples taken after fasting for at least 8 hours.   Comment 1 Notify RN    Comment 2 Document in Chart   Glucose, capillary     Status: Abnormal   Collection Time: 03/15/21  5:40 PM  Result Value Ref Range   Glucose-Capillary 222 (H) 70 - 99 mg/dL    Comment: Glucose reference range applies only to samples taken after fasting for at least 8 hours.  Glucose, capillary     Status: Abnormal   Collection Time: 03/16/21  5:25 AM  Result Value Ref Range   Glucose-Capillary 133 (H) 70 - 99 mg/dL    Comment: Glucose reference range applies only to samples taken after fasting for at least 8 hours.    Blood Alcohol level:  Lab Results  Component Value Date   [facility] <10 03/04/2021   ETH <10 09/13/2020    Metabolic Disorder Labs: Lab Results  Component Value Date   HGBA1C 5.5 03/07/2021   MPG 111.15 03/07/2021   MPG 123 04/30/2020  Lab Results  Component Value Date   PROLACTIN 13.6 04/30/2020   Lab Results  Component Value Date   CHOL 151 03/07/2021   TRIG 154 (H) 03/07/2021   HDL 32 (L) 03/07/2021   CHOLHDL 4.7 03/07/2021   VLDL 31 03/07/2021   LDLCALC 88 03/07/2021   LDLCALC 77 04/30/2020    Physical Findings:  Musculoskeletal: Strength & Muscle Tone: within normal limits Gait & Station:  unsteady  Pt getting OT Patient leans: N/A  Psychiatric Specialty Exam:  Presentation  General Appearance: Appropriate for environment Eye Contact:Fair Speech:Slow dysarthric, mumbling Speech Volume:Normal Handedness:Right   Mood and Affect  Mood: Euthymic Affect:Blunt   Thought Process  Thought Processes: some blocking Descriptions of Associations:Intact  Orientation:Full (Time, Place and Person)   Thought Content: Reports hearing music and then denies AVH. He would not cooperate to answer questions about ideas of reference, first rank symptoms, paranoia - no delusional  comments noted; is not grossly responding to internal stimuli on exam. Pt was seen by staff talking to himself loudly multiple times.   History of Schizophrenia/Schizoaffective disorder:Yes  Duration of Psychotic Symptoms:Greater than six months  Hallucinations:Hallucinations: Auditory   Ideas of Reference:-- (unable  to engage in meaningful conversation to answer questions.)  Suicidal Thoughts:Suicidal Thoughts: No  Homicidal Thoughts:Homicidal Thoughts: No   Sensorium  Memory:Immediate Good; Remote Poor; Recent Poor  Judgment:Fair - compliant with meds  Insight:lacking   Executive Functions  Concentration:Fair  Attention Span:Fair  Recall:Untested today  Fund of Knowledge:Fair  Language:Poor   Psychomotor Activity  Psychomotor Activity:Psychomotor Activity: Decreased   Assets  Assets:Desire for Improvement   Sleep  Sleep:6.5 hours  Physical Exam: Physical Exam Constitutional:      Appearance: He is obese.  HENT:     Head: Normocephalic and atraumatic.  Pulmonary:     Effort: Pulmonary effort is normal.  Neurological:     Mental Status: He is alert and oriented to person, place, and time.   Review of Systems  Constitutional:  Negative for fever.  HENT:  Negative for sore throat.   Respiratory:  Negative for cough.   Cardiovascular:  Negative for chest pain.  Gastrointestinal:  Negative for constipation.  Psychiatric/Behavioral:  Negative for hallucinations and suicidal ideas. The patient is not nervous/anxious.   Blood pressure 124/72, pulse (!) 106, temperature (!) 97.3 F (36.3 C), temperature source Oral, resp. rate 18, height 5\' 11"  (1.803 m), weight (!) 145.2 kg, SpO2 99 %. Body mass index is 44.63 kg/m.   Treatment Plan Summary: Daily contact with patient to assess and evaluate symptoms and progress in treatment and Medication management He has been incontinent of urine for two days now and blames his incontinence on Clozaril.  He  reported that Clozaril  makes him too tired and sleepy.  He also informed his Nurse that somebody voided on him.  Plan is to wean him off Saphris while we increase Clozaril.  . Schizophrenia, Chronic with acute exacerbation.  -Continue Saphris 5 mg twice daily.  - Goal of discontinuing as Clozaril becomes therapeutic -Continue Agitation Protocol: Zyprexa/Ativan/Geodon. -Continue Clozaril 100 mg QHS, will reassess for continued upward titration  -Start Clozaril 50 mg every evening  starting 03/17/2021 (Troponin 4 on 9/3 and CRP 1.9 on 9/3). Will repeat CBC with diff on Thursdays. Last CBC wnl  - Last EKG (03/13/21) - NSR, Qtc 428. EKG today  03/16/2021 is NSR, QTC interval 420 Catatonia  Have not been doing serial BFRS 2/2 pt preference. Some residual slowness noted.  -  Continue Ativan 1 mg twice daily.  - Topamax (incidentally on for wt loss) also tx catatonia, clozapine has data in catatonia as well.    Nicotine Dependence: -Continue Nicotine Patch 14 mg daily   HTN: -Continue Amlodipine 5 mg daily.   H/o PE -Continue Eliquis 5 mg BID   Chronic Constipation -Continue Colace 400 mg QHS   Obesity: -Continue Topamax 25 mg BID and continue Metformin 24 hr 500 mg BID as written by PCP   MS/gait disorder: - Reportedly getting Ocrevus by neurology was due on 9/8. -Pt infusion got cancelled due to  Pharmacy not able to order medication due to reimbursement issues. Mom informed.   - Home Health OT at D/c.   Other home meds: -Continue Xalatan 0.005% 1 drop both eyes QHS -Continue Claritin 10 mg daily   -DO NOT START TRAZODONE OR GABAPENTIN AS IT WAS STOPPED BY NEUROLOGY DUE TO GAIT INSTABILITY.   -Continue PRN's: Tylenol, Maalox, Atarax, Milk of Magnesia  Earney Navy, NP-PMHNP-BC 03/16/2021, 2:50 PM   I have reviewed the note by NP, and discussed the plan of care.  I am in agreement with the assessment and plan.  Mariel Craft, MD   Earney Navy,  NP-PMHNP-BC 03/16/2021, 2:50 PM

## 2021-03-16 NOTE — [facility] Group Notes (Signed)
Adult Psychoeducational Group Note  Date:  03/16/2021 Time:  5:50 PM  Group Topic/Focus:  Reflection  Participation Level:  Minimal  Participation Quality:  Intrusive  Affect:  Lethargic  Cognitive:  Alert  Insight: Lacking  Engagement in Group:  Lacking  Modes of Intervention:  Clarification and Limit-setting  Additional Comments:    Donell Beers 03/16/2021, 5:50 PM

## 2021-03-16 NOTE — [facility] Group Notes (Signed)
Adult Psychoeducational Group Note  Date:  03/16/2021 Time:  4:48 PM  Group Topic/Focus:  Goals Group:   The focus of this group is to help patients establish daily goals to achieve during treatment and discuss how the patient can incorporate goal setting into their daily lives to aide in recovery.  Participation Level:  Did Not Attend  Participation Quality:    Affect:    Cognitive:    Insight:   Engagement in Group:    Modes of Intervention:    Additional Comments:    Donell Beers 03/16/2021, 4:48 PM

## 2021-03-16 NOTE — [facility] Group Notes (Signed)
The focus of this group is to help patients review their daily goal of treatment and discuss progress on daily workbooks.     Processed with patient about some of the ways he uses to cope with things that upset him. Patient verbalized the need to "shut things out of his head and think about other things that are real.Patient was encouraged to continue to to use those skills in order to achieve  his goals.

## 2021-03-16 NOTE — Group Note (Signed)
Clinical Social Work Note  No group could be held today due to a COVID-19 outbreak on the unit.  An educational packet about anger management was provided to the patient to work on individually.  Pelagia Iacobucci Grossman-Orr, LCSW 03/16/2021  9:41 AM    

## 2021-03-16 NOTE — [facility] Group Notes (Signed)
Adult Psychoeducational Group Note  Date:  03/16/2021 Time:  5:16 PM  Group Topic/Focus:  Self Esteem Action Plan:   The focus of this group is to help patients create a plan to continue to build self-esteem after discharge.  Participation Level:  Did Not Attend  Participation Quality:                Cognitive:    Insight:   Engagement in Group:    Modes of Intervention:    Additional Comments:    Donell Beers 03/16/2021, 5:16 PM

## 2021-03-17 DIAGNOSIS — F209 Schizophrenia, unspecified: Secondary | ICD-10-CM | POA: Diagnosis not present

## 2021-03-17 LAB — GLUCOSE, CAPILLARY
Glucose-Capillary: 112 mg/dL — ABNORMAL HIGH (ref 70–99)
Glucose-Capillary: 122 mg/dL — ABNORMAL HIGH (ref 70–99)
Glucose-Capillary: 124 mg/dL — ABNORMAL HIGH (ref 70–99)

## 2021-03-17 MED ORDER — ASENAPINE MALEATE 5 MG SL SUBL
5.0000 mg | SUBLINGUAL_TABLET | Freq: Every day | SUBLINGUAL | Status: DC
  Administered 2021-03-18: 5 mg via SUBLINGUAL
  Filled 2021-03-17 (×2): qty 1

## 2021-03-17 MED ORDER — ASENAPINE MALEATE 5 MG SL SUBL: 5.0000 mg | SUBLINGUAL_TABLET | Freq: Two times a day (BID) | SUBLINGUAL | Status: DC | PRN

## 2021-03-17 MED ORDER — METOPROLOL SUCCINATE ER 25 MG PO TB24
25.0000 mg | ORAL_TABLET | Freq: Every day | ORAL | Status: DC
  Administered 2021-03-17 – 2021-03-28 (×12): 25 mg via ORAL
  Filled 2021-03-17 (×17): qty 1

## 2021-03-17 NOTE — Plan of Care (Signed)
Nurse discussed coping skills with patient.  

## 2021-03-17 NOTE — Progress Notes (Signed)
Pt is very loud on the unit, talking to unknown people telling them to leave him alone. Pt requires constant redirection, will continue to monitor.

## 2021-03-17 NOTE — [facility] Group Notes (Signed)
Clinical Social Work Note  No group could be held today due to a COVID-19 outbreak on the unit.  An educational packet about building supports was provided to the patient to work on individually.  Abdou Stocks Grossman-Orr, LCSW 03/17/2021, 8:45 AM    

## 2021-03-17 NOTE — Progress Notes (Signed)
Patient has been talking to himself this afternoon.  Patient denied SI and HI this morning.  Denied A/V hallucinations.  Patient has been responding to voices.

## 2021-03-17 NOTE — Progress Notes (Addendum)
[facility] MD Progress Note  03/17/2021 7:27 PM Brandon [name]  MRN:  [id number]  Chief Complaint: Psychosis-hx Schizophrenia, disorganized type  Subjective: " I am ok,, I don't hear voices but I talk to my self"  Objective note:  Report received, Nursing note reviewed and care reviewed with members of our interdisciplinary team.  Patient was seen awake in his room with no clothing on but covered with blanket.  He was calm during our conversation.  He denied hearing voices but stated that he talks to himself.  He denied urinating on himself but per Nursing report last night and this morning he did urinate on himself.  Actually the tech did his laundry this morning.  His Saphris is decreased to once a day schedule and the PRN is also decreased to twice a day as needed schedule.  Plan is to wean him off Saphris.  Last evening he was walking back and fort his room to dayroom and at night was speaking loud and had to be redirected by staff to lower his voice.  Again he reported that he was not hearing voices but like to talk to himself.  He is not ready for discharge at this time as we continue to manage his mood and Medications..  No anger outburst or agitation reported.  Patient denied SI/HI/AVH and he is compliant with his medications. Principal Problem: Schizophrenia, chronic with acute exacerbation (HCC) Diagnosis: Principal Problem:   Schizophrenia, chronic with acute exacerbation (HCC) Active Problems:   Obesity, Class III, BMI 40-49.9 (morbid obesity) (HCC)   Unsteady gait   Multiple sclerosis (HCC)   Schizophrenia, disorganized type (HCC)  Total Time Spent in Direct Patient Care: 25 minutes  Past Psychiatric History: See H&P  Past Medical History:  Past Medical History:  Diagnosis Date   ADHD (attention deficit hyperactivity disorder)    Bipolar 1 disorder (HCC)    Chronic back pain    Chronic constipation    Chronic neck pain    Hypertension    Multiple sclerosis (HCC) 05/20/2013    left sided weakness, dysarthria   Non-compliance    Obesity    Pulmonary embolism (HCC)    Schizophrenia (HCC)    Stroke (HCC)    left sided deficits - pt's mother denies this    Past Surgical History:  Procedure Laterality Date   NO PAST SURGERIES     None     RADIOLOGY WITH ANESTHESIA N/A 01/16/2021   Procedure: MRI WITH ANESTHESIA CERVICAL AND THORASIC SPINE WITH AND WITHOUT CONTRAST;  Surgeon: Radiologist, Medication, MD;  Location: [facility] OR;  Service: Radiology;  Laterality: N/A;   TOOTH EXTRACTION N/A 06/24/2019   Procedure: DENTAL RESTORATION/EXTRACTION OF TEETH NUMBER ONE, SIXTEEN, SEVENTEEN, NINETEEN, THIRTY-TWO;  Surgeon: Ocie Doyne, DDS;  Location: [facility] OR;  Service: Oral Surgery;  Laterality: N/A;   Family History:  Family History  Problem Relation Age of Onset   Diabetes Mother    ADD / ADHD Brother    Family Psychiatric  History: See H&P Social History:  Social History   Substance and Sexual Activity  Alcohol Use Not Currently   Alcohol/week: 0.0 standard drinks   Comment: "A little bit"      Social History   Substance and Sexual Activity  Drug Use Not Currently   Types: Marijuana   Comment: Last used: unknown     Social History   Socioeconomic History   Marital status: Single    Spouse name: Not on file   Number of children:  0   Years of education: 11th   Highest education level: Not on file  Occupational History   Occupation: unemployed    Employer: [employer]    Comment: Disbaled  Tobacco Use   Smoking status: Every Day    Packs/day: 0.25    Types: Cigarettes   Smokeless tobacco: Never   Tobacco comments:    2 cigarettes a day  Vaping Use   Vaping Use: Never used  Substance and Sexual Activity   Alcohol use: Not Currently    Alcohol/week: 0.0 standard drinks    Comment: "A little bit"    Drug use: Not Currently    Types: Marijuana    Comment: Last used: unknown    Sexual activity: Not on file  Other Topics Concern   Not on file   Social History Narrative   Patient lives at home with his mother.   Disabled.   Education 11 th grade .   Right handed.   Drinks caffeine occassionally   Social Determinants of Corporate investment banker Strain: Not on file  Food Insecurity: Not on file  Transportation Needs: Not on file  Physical Activity: Not on file  Stress: Not on file  Social Connections: Not on file    Sleep: Fair  Appetite:  Good  Current Medications: Current Facility-Administered Medications  Medication Dose Route Frequency Provider Last Rate Last Admin   acetaminophen (TYLENOL) tablet 650 mg  650 mg Oral Q6H PRN Rankin, Shuvon B, NP   650 mg at 03/13/21 2234   alum & mag hydroxide-simeth (MAALOX/MYLANTA) 200-200-20 MG/5ML suspension 30 mL  30 mL Oral Q4H PRN Rankin, Shuvon B, NP   30 mL at 03/14/21 1651   amLODipine (NORVASC) tablet 5 mg  5 mg Oral Daily Nira Conn A, NP   5 mg at 03/17/21 0818   apixaban (ELIQUIS) tablet 5 mg  5 mg Oral BID Nira Conn A, NP   5 mg at 03/17/21 1644   [START ON 03/18/2021] asenapine (SAPHRIS) sublingual tablet 5 mg  5 mg Sublingual Q1200 Onuoha, Josephine C, NP       asenapine (SAPHRIS) sublingual tablet 5 mg  5 mg Sublingual BID PRN Dahlia Byes C, NP       cloZAPine (CLOZARIL) tablet 100 mg  100 mg Oral QHS Cinderella, Margaret A   100 mg at 03/16/21 2055   cloZAPine (CLOZARIL) tablet 50 mg  50 mg Oral q morning Cinderella, Margaret A   50 mg at 03/17/21 1005   docusate sodium (COLACE) capsule 400 mg  400 mg Oral QHS Nira Conn A, NP   400 mg at 03/16/21 2055   latanoprost (XALATAN) 0.005 % ophthalmic solution 1 drop  1 drop Both Eyes QHS Nira Conn A, NP   1 drop at 03/16/21 2100   loratadine (CLARITIN) tablet 10 mg  10 mg Oral Daily Nira Conn A, NP   10 mg at 03/17/21 0820   LORazepam (ATIVAN) tablet 1 mg  1 mg Oral BID Eliseo Gum B, MD   1 mg at 03/17/21 1647   magnesium hydroxide (MILK OF MAGNESIA) suspension 30 mL  30 mL Oral Daily PRN Rankin,  Shuvon B, NP       metFORMIN (GLUCOPHAGE-XR) 24 hr tablet 500 mg  500 mg Oral BID WC Nira Conn A, NP   500 mg at 03/17/21 1645   metoprolol succinate (TOPROL-XL) 24 hr tablet 25 mg  25 mg Oral Daily Mariel Craft, MD   25  mg at 03/17/21 1719   multivitamin with minerals tablet 1 tablet  1 tablet Oral Daily Nira Conn A, NP   1 tablet at 03/17/21 0816   nicotine (NICODERM CQ - dosed in mg/24 hours) patch 14 mg  14 mg Transdermal Daily Nira Conn A, NP   14 mg at 03/17/21 0819   topiramate (TOPAMAX) tablet 25 mg  25 mg Oral BID Nira Conn A, NP   25 mg at 03/17/21 1645    Lab Results:  Results for orders placed or performed during the hospital encounter of 03/05/21 (from the past 48 hour(s))  Glucose, capillary     Status: Abnormal   Collection Time: 03/16/21  5:25 AM  Result Value Ref Range   Glucose-Capillary 133 (H) 70 - 99 mg/dL    Comment: Glucose reference range applies only to samples taken after fasting for at least 8 hours.  Glucose, capillary     Status: Abnormal   Collection Time: 03/16/21  6:56 PM  Result Value Ref Range   Glucose-Capillary 124 (H) 70 - 99 mg/dL    Comment: Glucose reference range applies only to samples taken after fasting for at least 8 hours.  Glucose, capillary     Status: Abnormal   Collection Time: 03/17/21  6:26 AM  Result Value Ref Range   Glucose-Capillary 112 (H) 70 - 99 mg/dL    Comment: Glucose reference range applies only to samples taken after fasting for at least 8 hours.  Glucose, capillary     Status: Abnormal   Collection Time: 03/17/21 12:27 PM  Result Value Ref Range   Glucose-Capillary 122 (H) 70 - 99 mg/dL    Comment: Glucose reference range applies only to samples taken after fasting for at least 8 hours.  Glucose, capillary     Status: Abnormal   Collection Time: 03/17/21  4:48 PM  Result Value Ref Range   Glucose-Capillary 124 (H) 70 - 99 mg/dL    Comment: Glucose reference range applies only to samples taken after  fasting for at least 8 hours.    Blood Alcohol level:  Lab Results  Component Value Date   ETH <10 03/04/2021   ETH <10 09/13/2020    Metabolic Disorder Labs: Lab Results  Component Value Date   HGBA1C 5.5 03/07/2021   MPG 111.15 03/07/2021   MPG 123 04/30/2020   Lab Results  Component Value Date   PROLACTIN 13.6 04/30/2020   Lab Results  Component Value Date   CHOL 151 03/07/2021   TRIG 154 (H) 03/07/2021   HDL 32 (L) 03/07/2021   CHOLHDL 4.7 03/07/2021   VLDL 31 03/07/2021   LDLCALC 88 03/07/2021   LDLCALC 77 04/30/2020    Physical Findings:  Musculoskeletal: Strength & Muscle Tone: within normal limits Gait & Station:  unsteady  Pt getting OT Patient leans: N/A  Psychiatric Specialty Exam:  Presentation  General Appearance: Appropriate for environment Eye Contact:Good Speech:Slow dysarthric, mumbling Speech Volume:Normal Handedness:Right   Mood and Affect  Mood: Euthymic Affect:Blunt   Thought Process  Thought Processes: some blocking Descriptions of Associations:Intact  Orientation:Full (Time, Place and Person)   Thought Content: Reports hearing music and then denies AVH. He would not cooperate to answer questions about ideas of reference, first rank symptoms, paranoia - no delusional comments noted; is not grossly responding to internal stimuli on exam. Pt was seen by staff talking to himself loudly multiple times.   History of Schizophrenia/Schizoaffective disorder:Yes  Duration of Psychotic Symptoms:Greater than six months  Hallucinations:Hallucinations: Auditory   Ideas of Reference:-- (unable  to engage in meaningful conversation to answer questions.)  Suicidal Thoughts:Suicidal Thoughts: No  Homicidal Thoughts:Homicidal Thoughts: No   Sensorium  Memory:Immediate Good; Recent Fair; Remote Poor  Judgment:Fair - compliant with meds  Insight:lacking   Executive Functions  Concentration:Fair  Attention  Span:Fair  Recall:Untested today  Fund of Knowledge:Fair  Language:Fair   Psychomotor Activity  Psychomotor Activity:Psychomotor Activity: Normal   Assets  Assets:Communication Skills; Desire for Improvement; Resilience   Sleep  Patient slept Number of Hours: 6   Physical Exam: Physical Exam Constitutional:      Appearance: He is obese.  HENT:     Head: Normocephalic and atraumatic.  Pulmonary:     Effort: Pulmonary effort is normal.  Neurological:     Mental Status: He is alert and oriented to person, place, and time.   Review of Systems  Constitutional:  Negative for fever.  HENT:  Negative for sore throat.   Respiratory:  Negative for cough.   Cardiovascular:  Negative for chest pain.  Gastrointestinal:  Negative for constipation.  Psychiatric/Behavioral:  Negative for hallucinations and suicidal ideas. The patient is not nervous/anxious.   Blood pressure (!) 130/92, pulse 93, temperature (!) 97.4 F (36.3 C), temperature source Oral, resp. rate 18, height 5\' 11"  (1.803 m), weight (!) 145.2 kg, SpO2 99 %. Body mass index is 44.63 kg/m.   Treatment Plan Summary: Daily contact with patient to assess and evaluate symptoms and progress in treatment and Medication management He has been incontinent of urine for two days now and blames his incontinence on Clozaril.  He reported that Clozaril  makes him too tired and sleepy.  He also informed his Nurse that somebody voided on him.  Plan is to wean him off Saphris while we increase Clozaril.  . Schizophrenia, Chronic with acute exacerbation.  -Change Saphris to 5 mg daily.  - Goal of discontinuing as Clozaril becomes therapeutic -Continue Agitation Protocol: Zyprexa/Ativan/Geodon. -Continue Clozaril 100 mg QHS, will reassess for continued upward titration  -Continue Clozaril 50 mg every evening  starting 03/17/2021 (Troponin 4 on 9/3 and CRP 1.9 on 9/3). Will repeat CBC with diff on Thursdays. Last CBC wnl  - Last  EKG (03/13/21) - NSR, Qtc 428. EKG  03/16/2021 is NSR, QTC interval 420 Catatonia  Have not been doing serial BFRS 2/2 pt preference. Some residual slowness noted.  -Continue Ativan 1 mg twice daily.  - Topamax (incidentally on for wt loss) also tx catatonia, clozapine has data in catatonia as well.    Nicotine Dependence: -Continue Nicotine Patch 14 mg daily   HTN: -Continue Amlodipine 5 mg daily.   H/o PE -Continue Eliquis 5 mg BID   Chronic Constipation -Continue Colace 400 mg QHS   Obesity: -Continue Topamax 25 mg BID and continue Metformin 24 hr 500 mg BID as written by PCP   MS/gait disorder: - Reportedly getting Ocrevus by neurology was due on 9/8. -Pt infusion got cancelled due to  Pharmacy not able to order medication due to reimbursement issues. Mom informed.   - Home Health OT at D/c.   Other home meds: -Continue Xalatan 0.005% 1 drop both eyes QHS -Continue Claritin 10 mg daily   -DO NOT START TRAZODONE OR GABAPENTIN AS IT WAS STOPPED BY NEUROLOGY DUE TO GAIT INSTABILITY.   -Continue PRN's: Tylenol, Maalox, Atarax, Milk of Magnesia    Earney Navy, NP-PMHNP-BC 03/17/2021, 1:20 PM  I have reviewed the note by NP Basilio Cairo, and  discussed the plan of care.  I am in agreement with the assessment and plan.  Patient continues to be sedated.  Antipsychotic medications being titrated for effective dose while minimizing side effects. He continues to have urinary incontinence.  We will continue to titrate medications to improve alertness during the day.   Mariel CraftSHEILA M Velita Quirk, MD   Mariel CraftSHEILA M Orlandus Borowski, MD 03/17/2021, 7:27 PM

## 2021-03-18 LAB — RESP PANEL BY RT-PCR (FLU A&B, COVID) ARPGX2
Influenza A by PCR: NEGATIVE
Influenza B by PCR: NEGATIVE
SARS Coronavirus 2 by RT PCR: NEGATIVE

## 2021-03-18 LAB — GLUCOSE, CAPILLARY
Glucose-Capillary: 117 mg/dL — ABNORMAL HIGH (ref 70–99)
Glucose-Capillary: 129 mg/dL — ABNORMAL HIGH (ref 70–99)
Glucose-Capillary: 207 mg/dL — ABNORMAL HIGH (ref 70–99)

## 2021-03-18 MED ORDER — ASENAPINE MALEATE 5 MG SL SUBL
5.0000 mg | SUBLINGUAL_TABLET | Freq: Every day | SUBLINGUAL | Status: DC
  Administered 2021-03-19 – 2021-03-21 (×3): 5 mg via SUBLINGUAL
  Filled 2021-03-18 (×5): qty 1

## 2021-03-18 NOTE — B[facility] Group Notes (Signed)
Adult Psychoeducational Group Note  Date:  03/18/2021 Time:  4:15 PM  Group Topic/Focus:  Managing Feelings:   The focus of this group is to identify what feelings patients have difficulty handling and develop a plan to handle them in a healthier way upon discharge.  Participation Level:  Active  Participation Quality:  Appropriate  Affect:  Appropriate  Cognitive:  Appropriate  Insight: Appropriate  Engagement in Group:  Engaged  Modes of Intervention:  Discussion  Additional Comments:  Patient attended and participated in the Psycho-Ed group.  Jearl Klinefelter 03/18/2021, 4:15 PM

## 2021-03-18 NOTE — [facility] Group Notes (Signed)
The focus of this group is to help patients establish daily goals to achieve during treatment and discuss how the patient can incorporate goal setting into their daily lives to aide in recovery.  Pt was aware but did not attend.

## 2021-03-18 NOTE — Progress Notes (Signed)
Adult Psychoeducational Group Note  Date:  03/18/2021 Time:  9:28 PM  Group Topic/Focus:  Wrap-Up Group:   The focus of this group is to help patients review their daily goal of treatment and discuss progress on daily workbooks.  Participation Level:  Active  Participation Quality:  Appropriate and Redirectable  Affect:  Appropriate  Cognitive:  Alert  Insight: Good and Improving  Engagement in Group:  Engaged  Modes of Intervention:  Discussion  Additional Comments: Review the patient's goals, progress, and objectives on this date and examine group skills about treatment. Discuss increasing trust in self-efficacy, building ego-strength, esteem, and sense of worth. Pt self-reporting has an overall day of 10 out of 10 on this date. End of Wrap-Up Group progress note.   Nicoletta Dress 03/18/2021, 9:28 PM

## 2021-03-18 NOTE — [facility] Group Notes (Signed)
[facility] LCSW Group Therapy Note  Date/Time: 03/18/2021 @ 1pm  Type of Therapy and Topic:  Group Therapy:  Communication  Participation Level:  Active  Description of Group:    In this group patients will be encouraged to explore how individuals communicate with one another appropriately and inappropriately. Patients will be guided to discuss their thoughts, feelings, and behaviors related to barriers communicating feelings, needs, and stressors. The group will process together ways to execute positive and appropriate communications, with attention given to how one use behavior, tone, and body language to communicate. Patient will be encouraged to reflect on an incident where they were successfully able to communicate and the factors that they believe helped them to communicate. Each patient will be encouraged to identify specific changes they are motivated to make in order to overcome communication barriers with self, peers, authority, and parents. This group will be process-oriented, with patients participating in exploration of their own experiences as well as giving and receiving support and challenging self as well as other group members.  Therapeutic Goals: Patient will identify how people communicate (body language, facial expression, and electronics) Also discuss tone, voice and how these impact what is communicated and how the message is perceived.  Patient will identify feelings (such as fear or worry), thought process and behaviors related to why people internalize feelings rather than express self openly. Patient will identify two changes they are willing to make to overcome communication barriers. Members will then practice through Role Play how to communicate by utilizing psycho-education material (such as I Feel statements and acknowledging feelings rather than displacing on others)   Summary of Patient Progress: Patient participated in group.  At times, patient was off topic and  disorganized.  Patient was redirectable and was able to share ways to he would improve his communication to be more effective. Patient identified that thinking more positively would assist him with communicating effectively.      Therapeutic Modalities:   Cognitive Behavioral Therapy Solution Focused Therapy Motivational Interviewing Family Systems Approach    Arryn Terrones [place], LCSW, LCAS Clincal Social Worker  [facility]

## 2021-03-18 NOTE — Group Note (Signed)
Recreation Therapy Group Note   Group Topic:Coping Skills  Group Date: 03/18/2021 Start Time: 0955 End Time: 1020 Facilitators: Caroll Rancher, LRT/CTRS Location: [address] Dayroom   Goal Area(s) Addresses: Patient will define what a coping skill is. Patient will work with peer to create a list of healthy coping skills beginning with each letter of the alphabet. Patient will successfully identify positive coping skills they can use post d/c.  Patient will acknowledge benefit(s) of using learned coping skills post d/c.   Group Description: Coping A to Z. Patient asked to identify what a coping skill is and when they use them.  Next patients were given a blank worksheet titled "Coping Skills A-Z" and asked to complete it.  Patients were instructed to come up with at least one positive coping skill per letter of the alphabet, addressing a specific challenge (ex: stress, anger, anxiety, depression, grief, doubt, isolation, self-harm/suicidal thoughts, substance use). Patients were given 15 minutes to brainstorm before ideas were presented to the large group. Patients and LRT debriefed on the importance of coping skill selection based on situation and back-up plans when a skill tried is not effective.   Affect/Mood: N/A   Participation Level: Did not attend    Clinical Observations/Individualized Feedback: Pt did not attend group session.     Plan: Continue to engage patient in RT group sessions 2-3x/week.   Caroll Rancher, LRT/CTRS 03/18/2021 12:16 PM

## 2021-03-18 NOTE — Progress Notes (Signed)
Pt visible on the unit this evening, pt stated he was doing better    03/18/21 2100  Psych Admission Type (Psych Patients Only)  Admission Status Involuntary  Psychosocial Assessment  Patient Complaints Anxiety  Eye Contact Brief  Facial Expression Flat  Affect Preoccupied  Speech Soft  Interaction Minimal  Motor Activity Slow  Appearance/Hygiene Improved;Poor hygiene  Behavior Characteristics Cooperative  Mood Anxious  Thought Process  Coherency Circumstantial  Content Preoccupation  Delusions Paranoid  Perception Hallucinations  Hallucination None reported or observed  Judgment Poor  Confusion None  Danger to Self  Current suicidal ideation? Denies  Danger to Others  Danger to Others None reported or observed

## 2021-03-18 NOTE — Progress Notes (Signed)
[facility] MD Progress Note  03/18/2021 10:46 AM Brandon [name]  MRN:  [id number]  Subjective: " I am feeling ok" Patient is seen and examined today.  Patient seen in his room.  Patient lying down and answered questions while lying down. He states his mood is "okay" notable mild latency of thoughts but he is able to answer questions in short sentences. No staring noted during exam.  He states he slept well last night.  He states his appetite is good and has been eating his meals.  Denies SI, HI, AVH.  When confronted about last night behavior and talking to himself.  He states he does not have any hallucinations.  He denies any pain, headache, nausea, vomiting, dizziness, abdominal pain, constipation, diarrhea.  He denies any side effects from Clozaril.  Patient was able to sit when requested for examination.  He is oriented to place, time, person.  He knows month, date, year.  Patient is able to tell me months of year backwards correctly with a lot of thinking.  He denied drooling and constipation. Per nursing staff notes-At around 11:42 PM yesterday, patient was very loud on the unit, talking to unknown people telling them to leave him alone.  He required constant redirection.  Diagnosis: Principal Problem:   Schizophrenia, chronic with acute exacerbation (HCC) Active Problems:   Obesity, Class III, BMI 40-49.9 (morbid obesity) (HCC)   Unsteady gait   Multiple sclerosis (HCC)   Schizophrenia, disorganized type (HCC)  Total Time Spent in Direct Patient Care: 25 minutes  Past Psychiatric History: See H&P  Past Medical History:  Past Medical History:  Diagnosis Date   ADHD (attention deficit hyperactivity disorder)    Bipolar 1 disorder (HCC)    Chronic back pain    Chronic constipation    Chronic neck pain    Hypertension    Multiple sclerosis (HCC) 05/20/2013   left sided weakness, dysarthria   Non-compliance    Obesity    Pulmonary embolism (HCC)    Schizophrenia (HCC)    Stroke  (HCC)    left sided deficits - pt's mother denies this    Past Surgical History:  Procedure Laterality Date   NO PAST SURGERIES     None     RADIOLOGY WITH ANESTHESIA N/A 01/16/2021   Procedure: MRI WITH ANESTHESIA CERVICAL AND THORASIC SPINE WITH AND WITHOUT CONTRAST;  Surgeon: Radiologist, Medication, MD;  Location: [facility] OR;  Service: Radiology;  Laterality: N/A;   TOOTH EXTRACTION N/A 06/24/2019   Procedure: DENTAL RESTORATION/EXTRACTION OF TEETH NUMBER ONE, SIXTEEN, SEVENTEEN, NINETEEN, THIRTY-TWO;  Surgeon: Ocie Doyne, DDS;  Location: [facility] OR;  Service: Oral Surgery;  Laterality: N/A;   Family History:  Family History  Problem Relation Age of Onset   Diabetes Mother    ADD / ADHD Brother    Family Psychiatric  History: See H&P Social History:  Social History   Substance and Sexual Activity  Alcohol Use Not Currently   Alcohol/week: 0.0 standard drinks   Comment: "A little bit"      Social History   Substance and Sexual Activity  Drug Use Not Currently   Types: Marijuana   Comment: Last used: unknown     Social History   Socioeconomic History   Marital status: Single    Spouse name: Not on file   Number of children: 0   Years of education: 11th   Highest education level: Not on file  Occupational History   Occupation: unemployed    Employer: [employer]    Comment: Disbaled  Tobacco Use   Smoking status: Every Day    Packs/day: 0.25    Types: Cigarettes   Smokeless tobacco: Never   Tobacco comments:    2 cigarettes a day  Vaping Use   Vaping Use: Never used  Substance and Sexual Activity   Alcohol use: Not Currently    Alcohol/week: 0.0 standard drinks    Comment: "A little bit"    Drug use: Not Currently    Types: Marijuana    Comment: Last used: unknown    Sexual activity: Not on file  Other Topics Concern   Not on file  Social History Narrative   Patient lives at home with his mother.   Disabled.   Education 11 th grade .   Right handed.    Drinks caffeine occassionally   Social Determinants of Corporate investment banker Strain: Not on file  Food Insecurity: Not on file  Transportation Needs: Not on file  Physical Activity: Not on file  Stress: Not on file  Social Connections: Not on file    Sleep: Fair  Appetite:  Good  Current Medications: Current Facility-Administered Medications  Medication Dose Route Frequency Provider Last Rate Last Admin   acetaminophen (TYLENOL) tablet 650 mg  650 mg Oral Q6H PRN Rankin, Shuvon B, NP   650 mg at 03/13/21 2234   alum & mag hydroxide-simeth (MAALOX/MYLANTA) 200-200-20 MG/5ML suspension 30 mL  30 mL Oral Q4H PRN Rankin, Shuvon B, NP   30 mL at 03/14/21 1651   amLODipine (NORVASC) tablet 5 mg  5 mg Oral Daily Nira Conn A, NP   5 mg at 03/17/21 0818   apixaban (ELIQUIS) tablet 5 mg  5 mg Oral BID Nira Conn A, NP   5 mg at 03/17/21 1644   asenapine (SAPHRIS) sublingual tablet 5 mg  5 mg Sublingual Q1200 Onuoha, Josephine C, NP       asenapine (SAPHRIS) sublingual tablet 5 mg  5 mg Sublingual BID PRN Dahlia Byes C, NP       cloZAPine (CLOZARIL) tablet 100 mg  100 mg Oral QHS Cinderella, Margaret A   100 mg at 03/17/21 2107   cloZAPine (CLOZARIL) tablet 50 mg  50 mg Oral q morning Cinderella, Margaret A   50 mg at 03/17/21 1005   docusate sodium (COLACE) capsule 400 mg  400 mg Oral QHS Nira Conn A, NP   400 mg at 03/17/21 2107   latanoprost (XALATAN) 0.005 % ophthalmic solution 1 drop  1 drop Both Eyes QHS Nira Conn A, NP   1 drop at 03/17/21 2109   loratadine (CLARITIN) tablet 10 mg  10 mg Oral Daily Nira Conn A, NP   10 mg at 03/17/21 0820   LORazepam (ATIVAN) tablet 1 mg  1 mg Oral BID Eliseo Gum B, MD   1 mg at 03/17/21 1647   magnesium hydroxide (MILK OF MAGNESIA) suspension 30 mL  30 mL Oral Daily PRN Rankin, Shuvon B, NP       metFORMIN (GLUCOPHAGE-XR) 24 hr tablet 500 mg  500 mg Oral BID WC Nira Conn A, NP   500 mg at 03/17/21 1645   metoprolol  succinate (TOPROL-XL) 24 hr tablet 25 mg  25 mg Oral Daily Mariel Craft, MD   25 mg at 03/17/21 1719   multivitamin with minerals tablet 1 tablet  1 tablet Oral Daily Nira Conn A, NP   1 tablet at 03/17/21 [phone]  nicotine (NICODERM CQ - dosed in mg/24 hours) patch 14 mg  14 mg Transdermal Daily Nira Conn A, NP   14 mg at 03/17/21 0819   topiramate (TOPAMAX) tablet 25 mg  25 mg Oral BID Nira Conn A, NP   25 mg at 03/17/21 1645    Lab Results:  Results for orders placed or performed during the hospital encounter of 03/05/21 (from the past 48 hour(s))  Glucose, capillary     Status: Abnormal   Collection Time: 03/16/21  6:56 PM  Result Value Ref Range   Glucose-Capillary 124 (H) 70 - 99 mg/dL    Comment: Glucose reference range applies only to samples taken after fasting for at least 8 hours.  Glucose, capillary     Status: Abnormal   Collection Time: 03/17/21  6:26 AM  Result Value Ref Range   Glucose-Capillary 112 (H) 70 - 99 mg/dL    Comment: Glucose reference range applies only to samples taken after fasting for at least 8 hours.  Glucose, capillary     Status: Abnormal   Collection Time: 03/17/21 12:27 PM  Result Value Ref Range   Glucose-Capillary 122 (H) 70 - 99 mg/dL    Comment: Glucose reference range applies only to samples taken after fasting for at least 8 hours.  Glucose, capillary     Status: Abnormal   Collection Time: 03/17/21  4:48 PM  Result Value Ref Range   Glucose-Capillary 124 (H) 70 - 99 mg/dL    Comment: Glucose reference range applies only to samples taken after fasting for at least 8 hours.  Resp Panel by RT-PCR (Flu A&B, Covid) Nasopharyngeal Swab     Status: None   Collection Time: 03/18/21  6:12 AM   Specimen: Nasopharyngeal Swab; Nasopharyngeal(NP) swabs in vial transport medium  Result Value Ref Range   SARS Coronavirus 2 by RT PCR NEGATIVE NEGATIVE    Comment: (NOTE) SARS-CoV-2 target nucleic acids are NOT DETECTED.  The SARS-CoV-2 RNA is  generally detectable in upper respiratory specimens during the acute phase of infection. The lowest concentration of SARS-CoV-2 viral copies this assay can detect is 138 copies/mL. A negative result does not preclude SARS-Cov-2 infection and should not be used as the sole basis for treatment or other patient management decisions. A negative result may occur with  improper specimen collection/handling, submission of specimen other than nasopharyngeal swab, presence of viral mutation(s) within the areas targeted by this assay, and inadequate number of viral copies(<138 copies/mL). A negative result must be combined with clinical observations, patient history, and epidemiological information. The expected result is Negative.  Fact Sheet for Patients:  BloggerCourse.com  Fact Sheet for Healthcare Providers:  SeriousBroker.it  This test is no t yet approved or cleared by the [place] FDA and  has been authorized for detection and/or diagnosis of SARS-CoV-2 by FDA under an Emergency Use Authorization (EUA). This EUA will remain  in effect (meaning this test can be used) for the duration of the COVID-19 declaration under Section 564(b)(1) of the Act, 21 U.S.C.section 360bbb-3(b)(1), unless the authorization is terminated  or revoked sooner.       Influenza A by PCR NEGATIVE NEGATIVE   Influenza B by PCR NEGATIVE NEGATIVE    Comment: (NOTE) The Xpert Xpress SARS-CoV-2/FLU/RSV plus assay is intended as an aid in the diagnosis of influenza from Nasopharyngeal swab specimens and should not be used as a sole basis for treatment. Nasal washings and aspirates are unacceptable for Xpert Xpress SARS-CoV-2/FLU/RSV testing.  Fact  Sheet for Patients: BloggerCourse.comhttps://www.fda.gov/media/152166/download  Fact Sheet for Healthcare Providers: SeriousBroker.ithttps://www.fda.gov/media/152162/download  This test is not yet approved or cleared by the [place] FDA  and has been authorized for detection and/or diagnosis of SARS-CoV-2 by FDA under an Emergency Use Authorization (EUA). This EUA will remain in effect (meaning this test can be used) for the duration of the COVID-19 declaration under Section 564(b)(1) of the Act, 21 U.S.C. section 360bbb-3(b)(1), unless the authorization is terminated or revoked.  Performed at [facility], [address] [place], [place] [address]   Glucose, capillary     Status: Abnormal   Collection Time: 03/18/21  6:18 AM  Result Value Ref Range   Glucose-Capillary 117 (H) 70 - 99 mg/dL    Comment: Glucose reference range applies only to samples taken after fasting for at least 8 hours.    Blood Alcohol level:  Lab Results  Component Value Date   ETH <10 03/04/2021   ETH <10 09/13/2020    Metabolic Disorder Labs: Lab Results  Component Value Date   HGBA1C 5.5 03/07/2021   MPG 111.15 03/07/2021   MPG 123 04/30/2020   Lab Results  Component Value Date   PROLACTIN 13.6 04/30/2020   Lab Results  Component Value Date   CHOL 151 03/07/2021   TRIG 154 (H) 03/07/2021   HDL 32 (L) 03/07/2021   CHOLHDL 4.7 03/07/2021   VLDL 31 03/07/2021   LDLCALC 88 03/07/2021   LDLCALC 77 04/30/2020    Physical Findings:  Musculoskeletal: Strength & Muscle Tone: within normal limits Gait & Station:  unsteady  Pt getting OT Patient leans: N/A  Psychiatric Specialty Exam:  Presentation  General Appearance: Appropriate for environment Eye Contact:Good Speech:Slow dysarthric, mumbling Speech Volume:Normal Handedness:Right   Mood and Affect  Mood: Euthymic Affect:Blunt   Thought Process  Thought Processes: some blocking Descriptions of Associations:Intact  Orientation:Full (Time, Place and Person)   Thought Content: Reports hearing music and then denies AVH. He would not cooperate to answer questions about ideas of reference, first rank symptoms, paranoia - no delusional comments  noted; is not grossly responding to internal stimuli on exam. Pt was seen by staff talking to himself loudly multiple times.   History of Schizophrenia/Schizoaffective disorder:Yes  Duration of Psychotic Symptoms:Greater than six months  Hallucinations:Hallucinations: Auditory   Ideas of Reference:-- (unable  to engage in meaningful conversation to answer questions.)  Suicidal Thoughts:Suicidal Thoughts: No  Homicidal Thoughts:Homicidal Thoughts: No   Sensorium  Memory:Immediate Good; Recent Fair; Remote Poor  Judgment:Fair - compliant with meds  Insight:lacking   Executive Functions  Concentration:Fair  Attention Span:Fair  Recall:Untested today  Fund of Knowledge:Fair  Language:Fair   Psychomotor Activity  Psychomotor Activity:Psychomotor Activity: Normal   Assets  Assets:Communication Skills; Desire for Improvement; Resilience   Sleep  Sleep:6.5 hours  Physical Exam: Physical Exam Constitutional:      Appearance: He is obese.  HENT:     Head: Normocephalic and atraumatic.  Pulmonary:     Effort: Pulmonary effort is normal.  Neurological:     Mental Status: He is alert and oriented to person, place, and time.   Review of Systems  Constitutional:  Negative for fever.  HENT:  Negative for sore throat.   Respiratory:  Negative for cough.   Cardiovascular:  Negative for chest pain.  Gastrointestinal:  Negative for constipation.  Psychiatric/Behavioral:  Negative for hallucinations and suicidal ideas. The patient is not nervous/anxious.   Blood pressure (!) 130/92, pulse 93, temperature (!) 97.4 F (  36.3 C), temperature source Oral, resp. rate 18, height 5\' 11"  (1.803 m), weight (!) 145.2 kg, SpO2 99 %. Body mass index is 44.63 kg/m.   Treatment Plan Summary: Daily contact with patient to assess and evaluate symptoms and progress in treatment and Medication management Mr. Maclin is a 30 yr old male who presents under IVC for acute psychosis in  setting of treatment resistance (mother verifies pill swallows daily). Had been on 2 antipsychotics (last haldol LAI dispensed early August, saphris 10 TID). In review of chart, has typically not responded well to most antipsychotics with documented compliance. He was previously on clozapine 2-3 years ago with good response, stopped d/t drooling per pt and mother. We discussed restarting this with his mother and pt who provided oral consent/assent to restart clozapine. We are slowly tapering him off of saphris given it has been ineffective and he has a vulnerable brain d/t MS and prior stroke. Ideally we would like to see him on clozapine as monotherapy (plus medication to ameliorate side effects if necessary) and are working towards this goal. Lately staff have been reporting increased disorganization and need for redirection mostly during evening/night time -yesterday, he was loud and talking to himself and needed redirection. Over the weekend patient was feeling too sedated.  Saphris was reduced to once daily dose.  Daytime Clazaril 50 mg was also added to the regime.  As needed Saphris is also available for agitation but patient did not require it.     Schizophrenia, Chronic with acute exacerbation.  -Continue Saphris 5 mg once daily (Changed timing to 2 pm daily to decrease evening agitation/psychosis ). - Goal of discontinuing as Clozaril becomes therapeutic -Saphris 5 mg twice daily as needed for agitation. -Continue Clozaril 100 mg QHS, will reassess for continued upward titration  -Continue Clozaril 50 mg in morning.  (Troponin 4 on 9/3 and CRP 1.9 on 9/3). Will repeat CBC with diff on Thursdays. Last CBC wnl - Last EKG  03/16/2021 is NSR, QTC interval 420  Catatonia  Have not been doing serial BFRS 2/2 pt preference. Some residual slowness noted.  -Continue Ativan 1 mg twice daily. - Topamax (incidentally on for wt loss) also tx catatonia, clozapine has data in catatonia as well.     Nicotine Dependence: -Continue Nicotine Patch 14 mg daily   HTN: -Continue Amlodipine 5 mg daily.   H/o PE -Continue Eliquis 5 mg BID   Chronic Constipation -Continue Colace 400 mg QHS   Obesity: -Continue Topamax 25 mg BID and continue Metformin 24 hr 500 mg BID as written by PCP   MS/gait disorder: - Reportedly getting Ocrevus by neurology was due on 9/8. - Pt infusion got cancelled due to  Pharmacy not able to order medication due to reimbursement issues. Mom informed and ok for him to get it after discharge.  - Home Health OT at D/c.   Other home meds: -Continue Xalatan 0.005% 1 drop both eyes QHS -Continue Claritin 10 mg daily   -DO NOT START TRAZODONE OR GABAPENTIN AS IT WAS STOPPED BY NEUROLOGY DUE TO GAIT INSTABILITY.   -Continue PRN's: Tylenol, Maalox, Atarax, Milk of Magnesia  11/8, MD PGY2 03/18/2021, 10:46 AM

## 2021-03-19 ENCOUNTER — Other Ambulatory Visit: Payer: Self-pay | Admitting: Neurology

## 2021-03-19 DIAGNOSIS — F201 Disorganized schizophrenia: Secondary | ICD-10-CM | POA: Diagnosis not present

## 2021-03-19 LAB — GLUCOSE, CAPILLARY
Glucose-Capillary: 124 mg/dL — ABNORMAL HIGH (ref 70–99)
Glucose-Capillary: 158 mg/dL — ABNORMAL HIGH (ref 70–99)
Glucose-Capillary: 158 mg/dL — ABNORMAL HIGH (ref 70–99)

## 2021-03-19 MED ORDER — CLOZAPINE 25 MG PO TABS
125.0000 mg | ORAL_TABLET | Freq: Every day | ORAL | Status: DC
  Administered 2021-03-19 – 2021-03-20 (×2): 125 mg via ORAL
  Filled 2021-03-19 (×3): qty 1

## 2021-03-19 NOTE — Progress Notes (Addendum)
[facility] MD Progress Note  03/19/2021 10:33 AM Brandon [name]  MRN:  [id number]  Subjective:  Patient is seen and examined today.  Patient seen in his room.  Patient eating his breakfast.  Patient is calm. He states his mood is "good" notable mild latency of thoughts but he is able to answer questions in short sentences. No staring noted during exam.  He states he slept well last night.  He states his appetite has been good.  He denies SI, HI, AVH.  He denies any hallucinations. He denies any pain, headache, nausea, vomiting, dizziness, abdominal pain, constipation, diarrhea.  He denies any side effects from Clozaril.  He denies any drooling or constipation.   He is oriented to place, time, person.  He knows month, date, year.  He is able to tell me current and past presidents.   Diagnosis: Principal Problem:   Schizophrenia, chronic with acute exacerbation (HCC) Active Problems:   Obesity, Class III, BMI 40-49.9 (morbid obesity) (HCC)   Unsteady gait   Multiple sclerosis (HCC)   Schizophrenia, disorganized type (HCC)  Total Time Spent in Direct Patient Care: 25 minutes  Past Psychiatric History: See H&P  Past Medical History:  Past Medical History:  Diagnosis Date   ADHD (attention deficit hyperactivity disorder)    Bipolar 1 disorder (HCC)    Chronic back pain    Chronic constipation    Chronic neck pain    Hypertension    Multiple sclerosis (HCC) 05/20/2013   left sided weakness, dysarthria   Non-compliance    Obesity    Pulmonary embolism (HCC)    Schizophrenia (HCC)    Stroke (HCC)    left sided deficits - pt's mother denies this    Past Surgical History:  Procedure Laterality Date   NO PAST SURGERIES     None     RADIOLOGY WITH ANESTHESIA N/A 01/16/2021   Procedure: MRI WITH ANESTHESIA CERVICAL AND THORASIC SPINE WITH AND WITHOUT CONTRAST;  Surgeon: Radiologist, Medication, MD;  Location: [facility] OR;  Service: Radiology;  Laterality: N/A;   TOOTH EXTRACTION N/A 06/24/2019    Procedure: DENTAL RESTORATION/EXTRACTION OF TEETH NUMBER ONE, SIXTEEN, SEVENTEEN, NINETEEN, THIRTY-TWO;  Surgeon: Ocie Doyne, DDS;  Location: [facility] OR;  Service: Oral Surgery;  Laterality: N/A;   Family History:  Family History  Problem Relation Age of Onset   Diabetes Mother    ADD / ADHD Brother    Family Psychiatric  History: See H&P Social History:  Social History   Substance and Sexual Activity  Alcohol Use Not Currently   Alcohol/week: 0.0 standard drinks   Comment: "A little bit"      Social History   Substance and Sexual Activity  Drug Use Not Currently   Types: Marijuana   Comment: Last used: unknown     Social History   Socioeconomic History   Marital status: Single    Spouse name: Not on file   Number of children: 0   Years of education: 11th   Highest education level: Not on file  Occupational History   Occupation: unemployed    Employer: [employer]    Comment: Disbaled  Tobacco Use   Smoking status: Every Day    Packs/day: 0.25    Types: Cigarettes   Smokeless tobacco: Never   Tobacco comments:    2 cigarettes a day  Vaping Use   Vaping Use: Never used  Substance and Sexual Activity   Alcohol use: Not Currently    Alcohol/week: 0.0 standard  drinks    Comment: "A little bit"    Drug use: Not Currently    Types: Marijuana    Comment: Last used: unknown    Sexual activity: Not on file  Other Topics Concern   Not on file  Social History Narrative   Patient lives at home with his mother.   Disabled.   Education 11 th grade .   Right handed.   Drinks caffeine occassionally   Social Determinants of Corporate investment banker Strain: Not on file  Food Insecurity: Not on file  Transportation Needs: Not on file  Physical Activity: Not on file  Stress: Not on file  Social Connections: Not on file    Sleep: Fair  Appetite:  Good  Current Medications: Current Facility-Administered Medications  Medication Dose Route Frequency  Provider Last Rate Last Admin   acetaminophen (TYLENOL) tablet 650 mg  650 mg Oral Q6H PRN Rankin, Shuvon B, NP   650 mg at 03/13/21 2234   alum & mag hydroxide-simeth (MAALOX/MYLANTA) 200-200-20 MG/5ML suspension 30 mL  30 mL Oral Q4H PRN Rankin, Shuvon B, NP   30 mL at 03/14/21 1651   amLODipine (NORVASC) tablet 5 mg  5 mg Oral Daily Nira Conn A, NP   5 mg at 03/19/21 0958   apixaban (ELIQUIS) tablet 5 mg  5 mg Oral BID Nira Conn A, NP   5 mg at 03/19/21 0958   asenapine (SAPHRIS) sublingual tablet 5 mg  5 mg Sublingual BID PRN Dahlia Byes C, NP       asenapine (SAPHRIS) sublingual tablet 5 mg  5 mg Sublingual Daily Doda, Vandana, MD       cloZAPine (CLOZARIL) tablet 100 mg  100 mg Oral QHS Cinderella, Margaret A   100 mg at 03/18/21 2135   cloZAPine (CLOZARIL) tablet 50 mg  50 mg Oral q morning Cinderella, Margaret A   50 mg at 03/19/21 1002   docusate sodium (COLACE) capsule 400 mg  400 mg Oral QHS Nira Conn A, NP   400 mg at 03/18/21 2134   latanoprost (XALATAN) 0.005 % ophthalmic solution 1 drop  1 drop Both Eyes QHS Nira Conn A, NP   1 drop at 03/18/21 2135   loratadine (CLARITIN) tablet 10 mg  10 mg Oral Daily Nira Conn A, NP   10 mg at 03/19/21 0958   LORazepam (ATIVAN) tablet 1 mg  1 mg Oral BID Eliseo Gum B, MD   1 mg at 03/19/21 0958   magnesium hydroxide (MILK OF MAGNESIA) suspension 30 mL  30 mL Oral Daily PRN Rankin, Shuvon B, NP       metFORMIN (GLUCOPHAGE-XR) 24 hr tablet 500 mg  500 mg Oral BID WC Nira Conn A, NP   500 mg at 03/19/21 0958   metoprolol succinate (TOPROL-XL) 24 hr tablet 25 mg  25 mg Oral Daily Mariel Craft, MD   25 mg at 03/19/21 6962   multivitamin with minerals tablet 1 tablet  1 tablet Oral Daily Nira Conn A, NP   1 tablet at 03/19/21 0954   nicotine (NICODERM CQ - dosed in mg/24 hours) patch 14 mg  14 mg Transdermal Daily Nira Conn A, NP   14 mg at 03/17/21 0819   topiramate (TOPAMAX) tablet 25 mg  25 mg Oral BID Nira Conn A, NP   25 mg at 03/19/21 9528    Lab Results:  Results for orders placed or performed during the hospital encounter of 03/05/21 (from  the past 48 hour(s))  Glucose, capillary     Status: Abnormal   Collection Time: 03/17/21 12:27 PM  Result Value Ref Range   Glucose-Capillary 122 (H) 70 - 99 mg/dL    Comment: Glucose reference range applies only to samples taken after fasting for at least 8 hours.  Glucose, capillary     Status: Abnormal   Collection Time: 03/17/21  4:48 PM  Result Value Ref Range   Glucose-Capillary 124 (H) 70 - 99 mg/dL    Comment: Glucose reference range applies only to samples taken after fasting for at least 8 hours.  Resp Panel by RT-PCR (Flu A&B, Covid) Nasopharyngeal Swab     Status: None   Collection Time: 03/18/21  6:12 AM   Specimen: Nasopharyngeal Swab; Nasopharyngeal(NP) swabs in vial transport medium  Result Value Ref Range   SARS Coronavirus 2 by RT PCR NEGATIVE NEGATIVE    Comment: (NOTE) SARS-CoV-2 target nucleic acids are NOT DETECTED.  The SARS-CoV-2 RNA is generally detectable in upper respiratory specimens during the acute phase of infection. The lowest concentration of SARS-CoV-2 viral copies this assay can detect is 138 copies/mL. A negative result does not preclude SARS-Cov-2 infection and should not be used as the sole basis for treatment or other patient management decisions. A negative result may occur with  improper specimen collection/handling, submission of specimen other than nasopharyngeal swab, presence of viral mutation(s) within the areas targeted by this assay, and inadequate number of viral copies(<138 copies/mL). A negative result must be combined with clinical observations, patient history, and epidemiological information. The expected result is Negative.  Fact Sheet for Patients:  BloggerCourse.com  Fact Sheet for Healthcare Providers:  SeriousBroker.it  This test  is no t yet approved or cleared by the [place] FDA and  has been authorized for detection and/or diagnosis of SARS-CoV-2 by FDA under an Emergency Use Authorization (EUA). This EUA will remain  in effect (meaning this test can be used) for the duration of the COVID-19 declaration under Section 564(b)(1) of the Act, 21 U.S.C.section 360bbb-3(b)(1), unless the authorization is terminated  or revoked sooner.       Influenza A by PCR NEGATIVE NEGATIVE   Influenza B by PCR NEGATIVE NEGATIVE    Comment: (NOTE) The Xpert Xpress SARS-CoV-2/FLU/RSV plus assay is intended as an aid in the diagnosis of influenza from Nasopharyngeal swab specimens and should not be used as a sole basis for treatment. Nasal washings and aspirates are unacceptable for Xpert Xpress SARS-CoV-2/FLU/RSV testing.  Fact Sheet for Patients: BloggerCourse.com  Fact Sheet for Healthcare Providers: SeriousBroker.it  This test is not yet approved or cleared by the [place] FDA and has been authorized for detection and/or diagnosis of SARS-CoV-2 by FDA under an Emergency Use Authorization (EUA). This EUA will remain in effect (meaning this test can be used) for the duration of the COVID-19 declaration under Section 564(b)(1) of the Act, 21 U.S.C. section 360bbb-3(b)(1), unless the authorization is terminated or revoked.  Performed at [facility], [address], [place], [place] [address]   Glucose, capillary     Status: Abnormal   Collection Time: 03/18/21  6:18 AM  Result Value Ref Range   Glucose-Capillary 117 (H) 70 - 99 mg/dL    Comment: Glucose reference range applies only to samples taken after fasting for at least 8 hours.  Glucose, capillary     Status: Abnormal   Collection Time: 03/18/21 11:13 AM  Result Value Ref Range   Glucose-Capillary 129 (  H) 70 - 99 mg/dL    Comment: Glucose reference range applies only to samples  taken after fasting for at least 8 hours.  Glucose, capillary     Status: Abnormal   Collection Time: 03/18/21  6:05 PM  Result Value Ref Range   Glucose-Capillary 207 (H) 70 - 99 mg/dL    Comment: Glucose reference range applies only to samples taken after fasting for at least 8 hours.  Glucose, capillary     Status: Abnormal   Collection Time: 03/19/21  5:41 AM  Result Value Ref Range   Glucose-Capillary 124 (H) 70 - 99 mg/dL    Comment: Glucose reference range applies only to samples taken after fasting for at least 8 hours.    Blood Alcohol level:  Lab Results  Component Value Date   ETH <10 03/04/2021   ETH <10 09/13/2020    Metabolic Disorder Labs: Lab Results  Component Value Date   HGBA1C 5.5 03/07/2021   MPG 111.15 03/07/2021   MPG 123 04/30/2020   Lab Results  Component Value Date   PROLACTIN 13.6 04/30/2020   Lab Results  Component Value Date   CHOL 151 03/07/2021   TRIG 154 (H) 03/07/2021   HDL 32 (L) 03/07/2021   CHOLHDL 4.7 03/07/2021   VLDL 31 03/07/2021   LDLCALC 88 03/07/2021   LDLCALC 77 04/30/2020    Physical Findings:  Musculoskeletal: Strength & Muscle Tone: within normal limits Gait & Station:  unsteady  Pt getting OT Patient leans: N/A  Psychiatric Specialty Exam:  Presentation  General Appearance: Appropriate for environment Eye Contact:Fair Speech:Slow dysarthric, mumbling, occasional speech latency Speech Volume:Normal Handedness:Right   Mood and Affect  Mood: aloof Affect:Blunt   Thought Process  Thought Processes: some blocking, superficially goal directed Descriptions of Associations:Intact  Orientation:Full (Time, Place and Person)   Thought Content: Reports hearing music and then denies AVH. He would not cooperate to answer questions about ideas of reference, first rank symptoms, paranoia - no delusional comments noted; is not grossly responding to internal stimuli on exam. Pt was seen by staff talking to himself  loudly multiple times.   History of Schizophrenia/Schizoaffective disorder:Yes  Duration of Psychotic Symptoms:Greater than six months  Hallucinations:Hallucinations: Auditory   Ideas of Reference:None  Suicidal Thoughts:Suicidal Thoughts: No  Homicidal Thoughts:Homicidal Thoughts: No   Sensorium  Memory:Immediate Good; Recent Fair; Remote Fair  Judgment:Fair - compliant with meds  Insight:lacking   Executive Functions  Concentration:Fair  Attention Span:Fair  Recall:Untested today  Fund of Knowledge:Fair  Language:Fair   Psychomotor Activity  Psychomotor Activity:Psychomotor Activity: Normal   Assets  Assets:Communication Skills; Desire for Improvement; Resilience; Social Support   Sleep  Sleep:6.5 hours  Physical Exam: Physical Exam Constitutional:      Appearance: He is obese.  HENT:     Head: Normocephalic and atraumatic.  Pulmonary:     Effort: Pulmonary effort is normal.  Neurological:     Mental Status: He is alert and oriented to person, place, and time.   Review of Systems  Constitutional:  Negative for fever.  HENT:  Negative for sore throat.   Respiratory:  Negative for cough.   Cardiovascular:  Negative for chest pain.  Gastrointestinal:  Negative for constipation.  Psychiatric/Behavioral:  Negative for hallucinations and suicidal ideas. The patient is not nervous/anxious.   Blood pressure 130/86, pulse 93, temperature 98 F (36.7 C), temperature source Oral, resp. rate 18, height 5\' 11"  (1.803 m), weight (!) 145.2 kg, SpO2 100 %. Body mass index is  44.63 kg/m.   Treatment Plan Summary: Daily contact with patient to assess and evaluate symptoms and progress in treatment and Medication management Mr. Balfour is a 30 yr old male who presents under IVC for acute psychosis in setting of treatment resistance (mother verifies pill swallows daily). Had been on 2 antipsychotics (last haldol LAI dispensed early August, saphris 10 TID). In  review of chart, has typically not responded well to most antipsychotics with documented compliance. He was previously on clozapine 2-3 years ago with good response, stopped d/t drooling per pt and mother. We discussed restarting this with his mother and pt who provided oral consent/assent to restart clozapine. We are slowly tapering him off of saphris given it has been ineffective and he has a vulnerable brain d/t MS and prior stroke. Ideally we would like to see him on clozapine as monotherapy (plus medication to ameliorate side effects if necessary) and are working towards this goal. Lately staff have been reporting increased disorganization and need for redirection mostly during evening/night time. Saphris was reduced to once daily dose.  Daytime Clozaril 50 mg was also added to the regime on weekend.  Saphris PRN is also available for agitation but patient did not require it. We'll increase Clozaril to 125 mg nightly.   Schizophrenia, Chronic with acute exacerbation.  - Continue Saphris 5 mg once daily ( at 2 pm daily to decrease evening agitation/psychosis ).- Goal of discontinuing as Clozaril becomes therapeutic - Saphris 5 mg twice daily as needed for agitation. - Increase Clozaril 125 mg QHS and continue Clozaril 50 mg in morning. - will reassess for continued upward titration as needed (Troponin I 4 on 9/3 and CRP 1.9 on 9/3; WBC 8.8 with ANC 5600 on 9/8, Troponin T 7 on 9/8). Will repeat CBC with diff, CRP and Troponin on Thursdays. - Last EKG  03/16/2021 is NSR, QTC interval 420  Catatonia  -Continue Ativan 1 mg twice daily. - Topamax (incidentally on for wt loss) also tx catatonia, clozapine has data in catatonia as well.    Nicotine Dependence: -Continue Nicotine Patch 14 mg daily   HTN: -Continue Amlodipine 5 mg daily. - Continue Toprol XL 25mg  daily   H/o PE -Continue Eliquis 5 mg BID   Chronic Constipation -Continue Colace 400 mg QHS   Obesity: -Continue Topamax 25 mg BID  and continue Metformin 24 hr 500 mg BID as written by PCP   MS/gait disorder: - Reportedly getting Ocrevus by neurology was due on 9/8. - Pt infusion got cancelled due to  Pharmacy not able to order medication due to reimbursement issues. Mom informed and ok for him to get it after discharge.  - Home Health OT at D/c.   Other home meds: -Continue Xalatan 0.005% 1 drop both eyes QHS -Continue Claritin 10 mg daily   -DO NOT START TRAZODONE OR GABAPENTIN AS IT WAS STOPPED BY NEUROLOGY DUE TO GAIT INSTABILITY.   -Continue PRN's: Tylenol, Maalox, Atarax, Milk of Magnesia  11/8, MD PGY2 03/19/2021, 10:33 AM

## 2021-03-19 NOTE — [facility] Group Notes (Signed)
Pt did not attend Psycho-ed Group. 

## 2021-03-19 NOTE — Group Note (Signed)
Recreation Therapy Group Note   Group Topic:Animal Assisted Therapy   Group Date: 03/19/2021 Start Time: 1430 End Time: 1510 Facilitators: Caroll Rancher, LRT/CTRS Location: [address]  AAA[facility]T Program Assumption of Risk Form signed by Patient/ or Parent Legal Guardian Yes   Patient is free of allergies or severe asthma Yes   Patient reports no fear of animals Yes   Patient reports no history of cruelty to animals Yes   Patient understands his/her participation is voluntary Yes   Patient washes hands before animal contact Yes   Patient washes hands after animal contact Yes   Affect/Mood: N/A   Participation Level: Did not attend     Clinical Observations/Individualized Feedback: Pt did not attend group activity.    Plan: Continue to engage patient in RT group sessions 2-3x/week.   Caroll Rancher, LRT/CTRS 03/19/2021 4:36 PM

## 2021-03-19 NOTE — Group Note (Signed)
Recreation Therapy Group Note   Group Topic:Self-Esteem  Group Date: 03/19/2021 Start Time: 1000 End Time: 1040 Facilitators: Caroll Rancher, LRT/CTRS Location: [address]  Goal Area(s) Addresses:  Patient will successfully identify positive attributes about themselves.  Patient will successfully identify benefit of improved self-esteem.   Group Description: LRT and patients discussed what self esteem is and the importance of it.  A discussion was also had on how self esteem affects day to day life.  Patients were given a self esteem worksheet.  The worksheet asked patients to identify 10 positive statements about themselves.  Patients would then identify three accomplishments they have achieved, something they have overcome, ways in which they practice self care and one positive goal for the future.   Affect/Mood: N/A   Participation Level: Did not attend    Clinical Observations/Individualized Feedback: Pt did not attend group session.    Plan: Continue to engage patient in RT group sessions 2-3x/week.   Caroll Rancher, LRT/CTRS 03/19/2021 1:01 PM

## 2021-03-19 NOTE — Progress Notes (Signed)
Pt.has been laughing loudly to himself for the past two fifteen minute checks. Pt.was redirected.

## 2021-03-19 NOTE — Progress Notes (Signed)
Psychoeducational Group Note  Date:  03/19/2021 Time:  2321  Group Topic/Focus:  Wrap-Up Group:   The focus of this group is to help patients review their daily goal of treatment and discuss progress on daily workbooks.  Participation Level: Did Not Attend  Participation Quality:  Not Applicable  Affect:  Not Applicable  Cognitive:  Not Applicable  Insight:  Not Applicable  Engagement in Group: Not Applicable  Additional Comments:  The patient did not attend group since he remained in his bedroom.   Hazle Coca S 03/19/2021, 11:22 PM

## 2021-03-19 NOTE — [facility] Group Notes (Signed)
Pt did not attend Therapeutic Relaxation Group.  

## 2021-03-19 NOTE — Progress Notes (Signed)
Pt visible on the unit this evening, pt more interactive with Clinical research associate and not appearing as paranoid. Pt had logical conversation and responses.     03/19/21 2300  Psych Admission Type (Psych Patients Only)  Admission Status Involuntary  Psychosocial Assessment  Patient Complaints None  Eye Contact Brief  Facial Expression Flat  Affect Preoccupied  Speech Soft  Interaction Minimal  Motor Activity Slow  Appearance/Hygiene Improved;Poor hygiene  Behavior Characteristics Cooperative  Mood Preoccupied  Aggressive Behavior  Effect No apparent injury  Thought Process  Coherency Circumstantial  Content Preoccupation  Delusions Paranoid  Perception Hallucinations  Hallucination None reported or observed  Judgment Poor  Confusion None  Danger to Self  Current suicidal ideation? Denies  Danger to Others  Danger to Others None reported or observed

## 2021-03-19 NOTE — [facility] Group Notes (Signed)
Pt did not attend nursing group. 

## 2021-03-20 ENCOUNTER — Encounter (HOSPITAL_COMMUNITY): Payer: Self-pay

## 2021-03-20 LAB — CLOZAPINE (CLOZARIL)
Clozapine Lvl: 141 ng/mL — ABNORMAL LOW (ref 350–650)
NorClozapine: 90 ng/mL
Total(Cloz+Norcloz): 231 ng/mL

## 2021-03-20 LAB — CBC WITH DIFFERENTIAL/PLATELET
Abs Immature Granulocytes: 0.12 10*3/uL — ABNORMAL HIGH (ref 0.00–0.07)
Basophils Absolute: 0.1 10*3/uL (ref 0.0–0.1)
Basophils Relative: 1 %
Eosinophils Absolute: 0.3 10*3/uL (ref 0.0–0.5)
Eosinophils Relative: 4 %
HCT: 44.4 % (ref 39.0–52.0)
Hemoglobin: 13.4 g/dL (ref 13.0–17.0)
Immature Granulocytes: 1 %
Lymphocytes Relative: 24 %
Lymphs Abs: 2 10*3/uL (ref 0.7–4.0)
MCH: 25.8 pg — ABNORMAL LOW (ref 26.0–34.0)
MCHC: 30.2 g/dL (ref 30.0–36.0)
MCV: 85.5 fL (ref 80.0–100.0)
Monocytes Absolute: 0.6 10*3/uL (ref 0.1–1.0)
Monocytes Relative: 8 %
Neutro Abs: 5.2 10*3/uL (ref 1.7–7.7)
Neutrophils Relative %: 62 %
Platelets: 218 10*3/uL (ref 150–400)
RBC: 5.19 MIL/uL (ref 4.22–5.81)
RDW: 14.6 % (ref 11.5–15.5)
WBC: 8.3 10*3/uL (ref 4.0–10.5)
nRBC: 0 % (ref 0.0–0.2)

## 2021-03-20 LAB — GLUCOSE, CAPILLARY
Glucose-Capillary: 105 mg/dL — ABNORMAL HIGH (ref 70–99)
Glucose-Capillary: 152 mg/dL — ABNORMAL HIGH (ref 70–99)
Glucose-Capillary: 153 mg/dL — ABNORMAL HIGH (ref 70–99)

## 2021-03-20 NOTE — [facility] Group Notes (Signed)
Pt did not attend Psych-Ed Group.  

## 2021-03-20 NOTE — Progress Notes (Signed)
[facility] MD Progress Note  03/20/2021 11:50 AM Brandon [name]  MRN:  [id number]  Subjective: Patient is seen and examined today.  Patient seen in his room. He states his mood is "good".  Patient is able to answer questions in short sentences. No staring noted during exam.  He states he slept well last night.  And has been eating good.  He denies SI, HI, AVH.  He denies any hallucinations. He denies any pain, headache, nausea, vomiting, dizziness, abdominal pain, constipation, diarrhea.  he denies any side effects from Clozaril.  He denies any drooling or constipation.   He is oriented to place, time, person.  He knows month, date, year.  He is able to tell me days of the week backwards. Per nursing staff-patient was laughing loudly to himself. In the evening he was more interactive and not paranoid.  Diagnosis: Principal Problem:   Schizophrenia, chronic with acute exacerbation (HCC) Active Problems:   Obesity, Class III, BMI 40-49.9 (morbid obesity) (HCC)   Unsteady gait   Multiple sclerosis (HCC)   Schizophrenia, disorganized type (HCC)  Total Time Spent in Direct Patient Care: 25 minutes  Past Psychiatric History: See H&P  Past Medical History:  Past Medical History:  Diagnosis Date   ADHD (attention deficit hyperactivity disorder)    Bipolar 1 disorder (HCC)    Chronic back pain    Chronic constipation    Chronic neck pain    Hypertension    Multiple sclerosis (HCC) 05/20/2013   left sided weakness, dysarthria   Non-compliance    Obesity    Pulmonary embolism (HCC)    Schizophrenia (HCC)    Stroke (HCC)    left sided deficits - pt's mother denies this    Past Surgical History:  Procedure Laterality Date   NO PAST SURGERIES     None     RADIOLOGY WITH ANESTHESIA N/A 01/16/2021   Procedure: MRI WITH ANESTHESIA CERVICAL AND THORASIC SPINE WITH AND WITHOUT CONTRAST;  Surgeon: Radiologist, Medication, MD;  Location: [facility] OR;  Service: Radiology;  Laterality: N/A;   TOOTH  EXTRACTION N/A 06/24/2019   Procedure: DENTAL RESTORATION/EXTRACTION OF TEETH NUMBER ONE, SIXTEEN, SEVENTEEN, NINETEEN, THIRTY-TWO;  Surgeon: Ocie Doyne, DDS;  Location: [facility] OR;  Service: Oral Surgery;  Laterality: N/A;   Family History:  Family History  Problem Relation Age of Onset   Diabetes Mother    ADD / ADHD Brother    Family Psychiatric  History: See H&P Social History:  Social History   Substance and Sexual Activity  Alcohol Use Not Currently   Alcohol/week: 0.0 standard drinks   Comment: "A little bit"      Social History   Substance and Sexual Activity  Drug Use Not Currently   Types: Marijuana   Comment: Last used: unknown     Social History   Socioeconomic History   Marital status: Single    Spouse name: Not on file   Number of children: 0   Years of education: 11th   Highest education level: Not on file  Occupational History   Occupation: unemployed    Employer: [employer]    Comment: Disbaled  Tobacco Use   Smoking status: Every Day    Packs/day: 0.25    Types: Cigarettes   Smokeless tobacco: Never   Tobacco comments:    2 cigarettes a day  Vaping Use   Vaping Use: Never used  Substance and Sexual Activity   Alcohol use: Not Currently    Alcohol/week: 0.0  standard drinks    Comment: "A little bit"    Drug use: Not Currently    Types: Marijuana    Comment: Last used: unknown    Sexual activity: Not on file  Other Topics Concern   Not on file  Social History Narrative   Patient lives at home with his mother.   Disabled.   Education 11 th grade .   Right handed.   Drinks caffeine occassionally   Social Determinants of Corporate investment banker Strain: Not on file  Food Insecurity: Not on file  Transportation Needs: Not on file  Physical Activity: Not on file  Stress: Not on file  Social Connections: Not on file    Sleep: Fair  Appetite:  Good  Current Medications: Current Facility-Administered Medications  Medication  Dose Route Frequency Provider Last Rate Last Admin   acetaminophen (TYLENOL) tablet 650 mg  650 mg Oral Q6H PRN Rankin, Shuvon B, NP   650 mg at 03/13/21 2234   alum & mag hydroxide-simeth (MAALOX/MYLANTA) 200-200-20 MG/5ML suspension 30 mL  30 mL Oral Q4H PRN Rankin, Shuvon B, NP   30 mL at 03/14/21 1651   amLODipine (NORVASC) tablet 5 mg  5 mg Oral Daily Nira Conn A, NP   5 mg at 03/20/21 0950   apixaban (ELIQUIS) tablet 5 mg  5 mg Oral BID Nira Conn A, NP   5 mg at 03/20/21 0950   asenapine (SAPHRIS) sublingual tablet 5 mg  5 mg Sublingual BID PRN Dahlia Byes C, NP       asenapine (SAPHRIS) sublingual tablet 5 mg  5 mg Sublingual Daily Hartman Minahan, MD   5 mg at 03/19/21 1323   cloZAPine (CLOZARIL) tablet 125 mg  125 mg Oral QHS Karsten Ro, MD   125 mg at 03/19/21 2135   cloZAPine (CLOZARIL) tablet 50 mg  50 mg Oral q morning Cinderella, Margaret A   50 mg at 03/20/21 0950   docusate sodium (COLACE) capsule 400 mg  400 mg Oral QHS Nira Conn A, NP   400 mg at 03/19/21 2135   latanoprost (XALATAN) 0.005 % ophthalmic solution 1 drop  1 drop Both Eyes QHS Nira Conn A, NP   1 drop at 03/19/21 2137   loratadine (CLARITIN) tablet 10 mg  10 mg Oral Daily Nira Conn A, NP   10 mg at 03/20/21 0950   LORazepam (ATIVAN) tablet 1 mg  1 mg Oral BID Eliseo Gum B, MD   1 mg at 03/20/21 0955   magnesium hydroxide (MILK OF MAGNESIA) suspension 30 mL  30 mL Oral Daily PRN Rankin, Shuvon B, NP       metFORMIN (GLUCOPHAGE-XR) 24 hr tablet 500 mg  500 mg Oral BID WC Nira Conn A, NP   500 mg at 03/20/21 0952   metoprolol succinate (TOPROL-XL) 24 hr tablet 25 mg  25 mg Oral Daily Mariel Craft, MD   25 mg at 03/20/21 0955   multivitamin with minerals tablet 1 tablet  1 tablet Oral Daily Nira Conn A, NP   1 tablet at 03/20/21 0951   nicotine (NICODERM CQ - dosed in mg/24 hours) patch 14 mg  14 mg Transdermal Daily Nira Conn A, NP   14 mg at 03/17/21 0819   topiramate (TOPAMAX)  tablet 25 mg  25 mg Oral BID Nira Conn A, NP   25 mg at 03/20/21 4854    Lab Results:  Results for orders placed or performed during the hospital  encounter of 03/05/21 (from the past 48 hour(s))  Glucose, capillary     Status: Abnormal   Collection Time: 03/18/21  6:05 PM  Result Value Ref Range   Glucose-Capillary 207 (H) 70 - 99 mg/dL    Comment: Glucose reference range applies only to samples taken after fasting for at least 8 hours.  Glucose, capillary     Status: Abnormal   Collection Time: 03/19/21  5:41 AM  Result Value Ref Range   Glucose-Capillary 124 (H) 70 - 99 mg/dL    Comment: Glucose reference range applies only to samples taken after fasting for at least 8 hours.  Glucose, capillary     Status: Abnormal   Collection Time: 03/19/21  1:14 PM  Result Value Ref Range   Glucose-Capillary 158 (H) 70 - 99 mg/dL    Comment: Glucose reference range applies only to samples taken after fasting for at least 8 hours.  Glucose, capillary     Status: Abnormal   Collection Time: 03/19/21  6:14 PM  Result Value Ref Range   Glucose-Capillary 158 (H) 70 - 99 mg/dL    Comment: Glucose reference range applies only to samples taken after fasting for at least 8 hours.  Glucose, capillary     Status: Abnormal   Collection Time: 03/20/21  6:47 AM  Result Value Ref Range   Glucose-Capillary 105 (H) 70 - 99 mg/dL    Comment: Glucose reference range applies only to samples taken after fasting for at least 8 hours.    Blood Alcohol level:  Lab Results  Component Value Date   ETH <10 03/04/2021   ETH <10 09/13/2020    Metabolic Disorder Labs: Lab Results  Component Value Date   HGBA1C 5.5 03/07/2021   MPG 111.15 03/07/2021   MPG 123 04/30/2020   Lab Results  Component Value Date   PROLACTIN 13.6 04/30/2020   Lab Results  Component Value Date   CHOL 151 03/07/2021   TRIG 154 (H) 03/07/2021   HDL 32 (L) 03/07/2021   CHOLHDL 4.7 03/07/2021   VLDL 31 03/07/2021   LDLCALC 88  03/07/2021   LDLCALC 77 04/30/2020    Physical Findings:  Musculoskeletal: Strength & Muscle Tone: within normal limits Gait & Station:  unsteady  Pt getting OT Patient leans: N/A  Psychiatric Specialty Exam:  Presentation  General Appearance: Appropriate for environment Eye Contact:Fair Speech:Slow dysarthric, mumbling, occasional speech latency Speech Volume:Normal Handedness:Right   Mood and Affect  Mood: aloof Affect:Blunt   Thought Process  Thought Processes: some blocking, superficially goal directed Descriptions of Associations:Intact  Orientation:Full (Time, Place and Person)   Thought Content: Reports hearing music and then denies AVH. He would not cooperate to answer questions about ideas of reference, first rank symptoms, paranoia - no delusional comments noted; is not grossly responding to internal stimuli on exam. Pt was seen by staff talking to himself loudly multiple times.   History of Schizophrenia/Schizoaffective disorder:Yes  Duration of Psychotic Symptoms:Greater than six months  Hallucinations:Hallucinations: Auditory   Ideas of Reference:None  Suicidal Thoughts:Suicidal Thoughts: No  Homicidal Thoughts:Homicidal Thoughts: No   Sensorium  Memory:Immediate Good; Recent Fair; Remote Fair  Judgment:Fair - compliant with meds  Insight:lacking   Executive Functions  Concentration:Fair  Attention Span:Fair  Recall:Untested today  Fund of Knowledge:Fair  Language:Fair   Psychomotor Activity  Psychomotor Activity:Psychomotor Activity: Normal   Assets  Assets:Communication Skills; Desire for Improvement; Resilience; Social Support   Sleep  Sleep:6.5 hours  Physical Exam: Physical Exam Constitutional:  Appearance: He is obese.  HENT:     Head: Normocephalic and atraumatic.  Pulmonary:     Effort: Pulmonary effort is normal.  Neurological:     Mental Status: He is alert and oriented to person, place, and time.    Review of Systems  Constitutional:  Negative for fever.  HENT:  Negative for sore throat.   Respiratory:  Negative for cough.   Cardiovascular:  Negative for chest pain.  Gastrointestinal:  Negative for constipation.  Psychiatric/Behavioral:  Negative for hallucinations and suicidal ideas. The patient is not nervous/anxious.   Blood pressure 134/88, pulse 100, temperature 98.2 F (36.8 C), temperature source Oral, resp. rate 18, height 5\' 11"  (1.803 m), weight (!) 145.2 kg, SpO2 100 %. Body mass index is 44.63 kg/m.   Treatment Plan Summary: Daily contact with patient to assess and evaluate symptoms and progress in treatment and Medication management Brandon [name] is a 30 yr old male who presents under IVC for acute psychosis in setting of treatment resistance (mother verifies pill swallows daily). Had been on 2 antipsychotics (last haldol LAI dispensed early August, saphris 10 TID). In review of chart, has typically not responded well to most antipsychotics with documented compliance. He was previously on clozapine 2-3 years ago with good response, stopped d/t drooling per pt and mother. We discussed restarting this with his mother and pt who provided oral consent/assent to restart clozapine. We are slowly tapering him off of saphris given it has been ineffective and he has a vulnerable brain d/t MS and prior stroke. Ideally we would like to see him on clozapine as monotherapy (plus medication to ameliorate side effects if necessary) and are working towards this goal. Lately staff have been reporting increased disorganization and need for redirection mostly during evening/night time. Saphris was reduced to once daily dose.  Daytime Clozaril 50 mg was also added to the regime on weekend.  Saphris PRN is also available for agitation but patient did not require it. We'll continue Clozaril to 125 mg nightly.   Schizophrenia, Chronic with acute exacerbation.  - Continue Saphris 5 mg once daily ( at 2  pm daily to decrease evening agitation/psychosis ).- Goal of discontinuing as Clozaril becomes therapeutic - Saphris 5 mg twice daily as needed for agitation. -Continue Clozaril 125 mg QHS and continue Clozaril 50 mg in morning. - will reassess for continued upward titration as needed (Troponin I 4 on 9/3 and CRP 1.9 on 9/3; WBC 8.8 with ANC 5600 on 9/8, Troponin T 7 on 9/8). Will repeat CBC with diff, CRP and Troponin on Thursdays. - Last EKG  03/16/2021 is NSR, QTC interval 420  Catatonia  -Continue Ativan 1 mg twice daily. - Topamax (incidentally on for wt loss) also tx catatonia, clozapine has data in catatonia as well.    Nicotine Dependence: -Continue Nicotine Patch 14 mg daily   HTN: -Continue Amlodipine 5 mg daily. - Continue Toprol XL 25mg  daily   H/o PE -Continue Eliquis 5 mg BID   Chronic Constipation -Continue Colace 400 mg QHS   Obesity: -Continue Topamax 25 mg BID and continue Metformin 24 hr 500 mg BID as written by PCP   MS/gait disorder: - Reportedly getting Ocrevus by neurology was due on 9/8. - Pt infusion got cancelled due to  Pharmacy not able to order medication due to reimbursement issues. Mom informed and ok for him to get it after discharge.  - Home Health OT at D/c.   Other home meds: -Continue Xalatan 0.005%  1 drop both eyes QHS -Continue Claritin 10 mg daily   -DO NOT START TRAZODONE OR GABAPENTIN AS IT WAS STOPPED BY NEUROLOGY DUE TO GAIT INSTABILITY.   -Continue PRN's: Tylenol, Maalox, Atarax, Milk of Magnesia  Karsten Ro, MD PGY2 03/20/2021, 11:50 AM

## 2021-03-20 NOTE — BH IP Treatment Plan (Signed)
Interdisciplinary Treatment and Diagnostic Plan Update  03/20/2021 Time of Session:  Brandon [name] MRN: [id number]  Principal Diagnosis: Schizophrenia, chronic with acute exacerbation (HCC)  Secondary Diagnoses: Principal Problem:   Schizophrenia, chronic with acute exacerbation (HCC) Active Problems:   Obesity, Class III, BMI 40-49.9 (morbid obesity) (HCC)   Unsteady gait   Multiple sclerosis (HCC)   Schizophrenia, disorganized type (HCC)   Current Medications:  Current Facility-Administered Medications  Medication Dose Route Frequency Provider Last Rate Last Admin   acetaminophen (TYLENOL) tablet 650 mg  650 mg Oral Q6H PRN Rankin, Shuvon B, NP   650 mg at 03/13/21 2234   alum & mag hydroxide-simeth (MAALOX/MYLANTA) 200-200-20 MG/5ML suspension 30 mL  30 mL Oral Q4H PRN Rankin, Shuvon B, NP   30 mL at 03/14/21 1651   amLODipine (NORVASC) tablet 5 mg  5 mg Oral Daily Nira Conn A, NP   5 mg at 03/20/21 0950   apixaban (ELIQUIS) tablet 5 mg  5 mg Oral BID Nira Conn A, NP   5 mg at 03/20/21 0950   asenapine (SAPHRIS) sublingual tablet 5 mg  5 mg Sublingual BID PRN Dahlia Byes C, NP       asenapine (SAPHRIS) sublingual tablet 5 mg  5 mg Sublingual Daily Leone Haven, Vandana, MD   5 mg at 03/20/21 1357   cloZAPine (CLOZARIL) tablet 125 mg  125 mg Oral QHS Karsten Ro, MD   125 mg at 03/19/21 2135   cloZAPine (CLOZARIL) tablet 50 mg  50 mg Oral q morning Cinderella, Margaret A   50 mg at 03/20/21 0950   docusate sodium (COLACE) capsule 400 mg  400 mg Oral QHS Nira Conn A, NP   400 mg at 03/19/21 2135   latanoprost (XALATAN) 0.005 % ophthalmic solution 1 drop  1 drop Both Eyes QHS Nira Conn A, NP   1 drop at 03/19/21 2137   loratadine (CLARITIN) tablet 10 mg  10 mg Oral Daily Nira Conn A, NP   10 mg at 03/20/21 0950   LORazepam (ATIVAN) tablet 1 mg  1 mg Oral BID Eliseo Gum B, MD   1 mg at 03/20/21 0955   magnesium hydroxide (MILK OF MAGNESIA) suspension 30 mL  30 mL  Oral Daily PRN Rankin, Shuvon B, NP       metFORMIN (GLUCOPHAGE-XR) 24 hr tablet 500 mg  500 mg Oral BID WC Nira Conn A, NP   500 mg at 03/20/21 0952   metoprolol succinate (TOPROL-XL) 24 hr tablet 25 mg  25 mg Oral Daily Mariel Craft, MD   25 mg at 03/20/21 0955   multivitamin with minerals tablet 1 tablet  1 tablet Oral Daily Nira Conn A, NP   1 tablet at 03/20/21 0951   nicotine (NICODERM CQ - dosed in mg/24 hours) patch 14 mg  14 mg Transdermal Daily Nira Conn A, NP   14 mg at 03/17/21 0819   topiramate (TOPAMAX) tablet 25 mg  25 mg Oral BID Nira Conn A, NP   25 mg at 03/20/21 0950   PTA Medications: Medications Prior to Admission  Medication Sig Dispense Refill Last Dose   acetaminophen (TYLENOL) 500 MG tablet Take 1 tablet (500 mg total) by mouth every 6 (six) hours as needed. (Patient taking differently: Take 500 mg by mouth every 6 (six) hours as needed for moderate pain.) 30 tablet 0    amantadine (SYMMETREL) 100 MG capsule Take 100 mg by mouth 2 (two) times daily.  amLODipine (NORVASC) 5 MG tablet Take 5 mg by mouth daily.      apixaban (ELIQUIS) 5 MG TABS tablet Take 5 mg by mouth 2 (two) times daily.       Asenapine Maleate 10 MG SUBL Place 1 tablet (10 mg total) under the tongue in the morning, at noon, and at bedtime. 90 tablet 1    clonazePAM (KLONOPIN) 0.5 MG tablet Take 0.5 mg by mouth daily as needed for anxiety.      docusate sodium (COLACE) 100 MG capsule Take 400 mg by mouth at bedtime.       haloperidol (HALDOL) 10 MG tablet Take 1 tablet (10 mg total) by mouth 2 (two) times daily. 60 tablet 0    haloperidol decanoate (HALDOL DECANOATE) 100 MG/ML injection Inject 3 mLs (300 mg total) into the muscle every 28 (twenty-eight) days. 3 mL 3    latanoprost (XALATAN) 0.005 % ophthalmic solution INSTILL 1 DROP INTO BOTH EYES EVERY NIGHT 7.5 mL 10    loratadine (CLARITIN) 10 MG tablet Take 10 mg by mouth daily.      metFORMIN (GLUCOPHAGE-XR) 500 MG 24 hr tablet  Take 500 mg by mouth 2 (two) times daily with a meal.      Multiple Vitamin (MULTIVITAMIN WITH MINERALS) TABS tablet Take 1 tablet by mouth daily.       Patient Stressors: Financial difficulties   Health problems   Occupational concerns    Patient Strengths: Motivation for treatment/growth  Supportive family/friends   Treatment Modalities: Medication Management, Group therapy, Case management,  1 to 1 session with clinician, Psychoeducation, Recreational therapy.   Physician Treatment Plan for Primary Diagnosis: Schizophrenia, chronic with acute exacerbation (HCC) Long Term Goal(s): Improvement in symptoms so as ready for discharge   Short Term Goals: Ability to identify changes in lifestyle to reduce recurrence of condition will improve Ability to verbalize feelings will improve Ability to identify and develop effective coping behaviors will improve Compliance with prescribed medications will improve Ability to identify triggers associated with substance abuse/mental health issues will improve  Medication Management: Evaluate patient's response, side effects, and tolerance of medication regimen.  Therapeutic Interventions: 1 to 1 sessions, Unit Group sessions and Medication administration.  Evaluation of Outcomes: Progressing  Physician Treatment Plan for Secondary Diagnosis: Principal Problem:   Schizophrenia, chronic with acute exacerbation (HCC) Active Problems:   Obesity, Class III, BMI 40-49.9 (morbid obesity) (HCC)   Unsteady gait   Multiple sclerosis (HCC)   Schizophrenia, disorganized type (HCC)  Long Term Goal(s): Improvement in symptoms so as ready for discharge   Short Term Goals: Ability to identify changes in lifestyle to reduce recurrence of condition will improve Ability to verbalize feelings will improve Ability to identify and develop effective coping behaviors will improve Compliance with prescribed medications will improve Ability to identify triggers  associated with substance abuse/mental health issues will improve     Medication Management: Evaluate patient's response, side effects, and tolerance of medication regimen.  Therapeutic Interventions: 1 to 1 sessions, Unit Group sessions and Medication administration.  Evaluation of Outcomes: Progressing   RN Treatment Plan for Primary Diagnosis: Schizophrenia, chronic with acute exacerbation (HCC) Long Term Goal(s): Knowledge of disease and therapeutic regimen to maintain health will improve  Short Term Goals: Ability to demonstrate self-control and Ability to participate in decision making will improve  Medication Management: RN will administer medications as ordered by provider, will assess and evaluate patient's response and provide education to patient for prescribed medication. RN will report  any adverse and/or side effects to prescribing provider.  Therapeutic Interventions: 1 on 1 counseling sessions, Psychoeducation, Medication administration, Evaluate responses to treatment, Monitor vital signs and CBGs as ordered, Perform/monitor CIWA, COWS, AIMS and Fall Risk screenings as ordered, Perform wound care treatments as ordered.  Evaluation of Outcomes: Progressing   LCSW Treatment Plan for Primary Diagnosis: Schizophrenia, chronic with acute exacerbation (HCC) Long Term Goal(s): Safe transition to appropriate next level of care at discharge, Engage patient in therapeutic group addressing interpersonal concerns.  Short Term Goals: Engage patient in aftercare planning with referrals and resources, Increase social support, and Increase ability to appropriately verbalize feelings  Therapeutic Interventions: Assess for all discharge needs, 1 to 1 time with Social worker, Explore available resources and support systems, Assess for adequacy in community support network, Educate family and significant other(s) on suicide prevention, Complete Psychosocial Assessment, Interpersonal group  therapy.  Evaluation of Outcomes: Progressing   Progress in Treatment: Attending groups: Yes. Participating in groups: Yes. Taking medication as prescribed: Yes. Toleration medication: Yes. Family/Significant other contact made: Yes, individual(s) contacted:  mom legal guardain Patient understands diagnosis: No. Discussing patient identified problems/goals with staff: Yes. Medical problems stabilized or resolved: Yes. Denies suicidal/homicidal ideation: Yes. Issues/concerns per patient self-inventory: No. Other: None  New problem(s) identified: No, Describe:  None  New Short Term/Long Term Goal(s):medication stabilization, elimination of SI thoughts, development of comprehensive mental wellness plan.   Patient Goals:  "to go home"  Discharge Plan or Barriers: Patient recently admitted. CSW will continue to follow and assess for appropriate referrals and possible discharge planning.   Reason for Continuation of Hospitalization: Medication stabilization  Estimated Length of Stay: 3-5 days   Scribe for Treatment Team: Chrys Racer 03/20/2021 2:39 PM

## 2021-03-20 NOTE — Group Note (Signed)
LCSW Group Therapy Note   Group Date: 03/20/2021 Start Time: 1300 End Time: 1400   Type of Therapy and Topic:  Group Therapy: Boundaries  Participation Level:  Did Not Attend  Description of Group: This group will address the use of boundaries in their personal lives. Patients will explore why boundaries are important, the difference between healthy and unhealthy boundaries, and negative and postive outcomes of different boundaries and will look at how boundaries can be crossed.  Patients will be encouraged to identify current boundaries in their own lives and identify what kind of boundary is being set. Facilitators will guide patients in utilizing problem-solving interventions to address and correct types boundaries being used and to address when no boundary is being used. Understanding and applying boundaries will be explored and addressed for obtaining and maintaining a balanced life. Patients will be encouraged to explore ways to assertively make their boundaries and needs known to significant others in their lives, using other group members and facilitator for role play, support, and feedback.  Therapeutic Goals:  1.  Patient will identify areas in their life where setting clear boundaries could be  used to improve their life.  2.  Patient will identify signs/triggers that a boundary is not being respected. 3.  Patient will identify two ways to set boundaries in order to achieve balance in  their lives: 4.  Patient will demonstrate ability to communicate their needs and set boundaries  through discussion and/or role plays  Summary of Patient Progress:  Did not attend  Therapeutic Modalities:   Cognitive Behavioral Therapy Solution-Focused Therapy  Beatris Si, LCSWA 03/20/2021  2:21 PM

## 2021-03-20 NOTE — Progress Notes (Signed)
Pt visible in the dayroom, responding to internal stimuli at times    03/20/21 2200  Psych Admission Type (Psych Patients Only)  Admission Status Involuntary  Psychosocial Assessment  Patient Complaints None  Eye Contact Fair  Facial Expression Flat  Affect Preoccupied  Speech Soft  Interaction Minimal  Motor Activity Slow  Appearance/Hygiene Improved;Poor hygiene  Behavior Characteristics Cooperative  Mood Suspicious;Preoccupied  Aggressive Behavior  Effect No apparent injury  Thought Process  Coherency Circumstantial  Content Preoccupation  Delusions Paranoid  Perception Hallucinations  Hallucination None reported or observed  Judgment Poor  Confusion None  Danger to Self  Current suicidal ideation? Denies  Danger to Others  Danger to Others None reported or observed

## 2021-03-20 NOTE — Group Note (Signed)
Recreation Therapy Group Note   Group Topic:Stress Management  Group Date: 03/20/2021 Start Time: 0930 End Time: 0950 Facilitators: Caroll Rancher, LRT/CTRS Location: [address] Dayroom  Goal Area(s) Addresses:  Patient will actively participate in stress management techniques presented during session.  Patient will successfully identify benefit of practicing stress management post d/c.    Group Description:  Guided Imagery. LRT provided education, instruction, and demonstration on practice of visualization via guided imagery. Patient was asked to participate in the technique introduced during session in which patients would take a journey along the beach. LRT debriefed including topics of mindfulness, stress management and specific scenarios each patient could use these techniques. Patients were given suggestions of ways to access scripts post d/c and encouraged to explore Youtube and other apps available on smartphones, tablets, and computers.  Affect/Mood: N/A   Participation Level: Did not attend    Clinical Observations/Individualized Feedback: Pt did not participate in group session.   Plan: Continue to engage patient in RT group sessions 2-3x/week.   Caroll Rancher, LRT/CTRS 03/20/2021 11:55 AM

## 2021-03-20 NOTE — Progress Notes (Signed)
Pt did not attend scheduled groups despite multiple prompts. Assisted by staff to shower and clean his room. Denies SI, HI, AVH and pain when assessed "not right now". However, pt observed yelling at intervals with inappropriate laughter but is verbally redirectable. Interacts well with peers and staff when engaged. Pt remains medication compliant; reports his continued urine incontinence is "from the medicine, I don't want it no more".  Tolerated all meals and fluids well.  Support offered to pt. Writer encouraged pt to attend to his ADLs on a regular basis. Q 15 minutes safety checks effective without falls, self harm gestures or outburst to note thus far.

## 2021-03-20 NOTE — [facility] Group Notes (Signed)
Pt did not attend goals group. 

## 2021-03-21 ENCOUNTER — Other Ambulatory Visit: Payer: Self-pay | Admitting: Neurology

## 2021-03-21 ENCOUNTER — Other Ambulatory Visit (HOSPITAL_COMMUNITY): Payer: Self-pay

## 2021-03-21 LAB — GLUCOSE, CAPILLARY
Glucose-Capillary: 122 mg/dL — ABNORMAL HIGH (ref 70–99)
Glucose-Capillary: 101 mg/dL — ABNORMAL HIGH (ref 70–99)
Glucose-Capillary: 188 mg/dL — ABNORMAL HIGH (ref 70–99)

## 2021-03-21 LAB — TROPONIN I (HIGH SENSITIVITY): Troponin I (High Sensitivity): 3 ng/L (ref <18)

## 2021-03-21 LAB — C-REACTIVE PROTEIN: CRP: 2.6 mg/dL — ABNORMAL HIGH (ref <1.0)

## 2021-03-21 MED ORDER — SODIUM CHLORIDE 0.9 % IV SOLN: 600.0000 mg | INTRAVENOUS | 0 refills | Status: DC

## 2021-03-21 MED ORDER — CLOZAPINE 100 MG PO TABS
150.0000 mg | ORAL_TABLET | Freq: Every day | ORAL | Status: DC
  Administered 2021-03-21 – 2021-03-27 (×7): 150 mg via ORAL
  Filled 2021-03-21 (×10): qty 2

## 2021-03-21 NOTE — Progress Notes (Signed)
Patient rated his day as a 10 out of 10 since he was able to talk to his peers today and felt "good vibes" from his peers. His goal for tomorrow is to get discharged and have another good day.

## 2021-03-21 NOTE — [facility] Group Notes (Signed)
Pt did not attend group. 

## 2021-03-21 NOTE — [facility] Group Notes (Signed)
Patient did not attend group.

## 2021-03-21 NOTE — Progress Notes (Signed)
Pt visible on the unit, pt focused on D/C since he was informed he would be able to leave once his lab work continues to be good    03/21/21 2100  Psych Admission Type (Psych Patients Only)  Admission Status Involuntary  Psychosocial Assessment  Patient Complaints Worrying  Eye Contact Fair  Facial Expression Flat  Affect Preoccupied  Speech Soft  Interaction Minimal  Motor Activity Slow  Appearance/Hygiene Improved;Poor hygiene  Behavior Characteristics Cooperative  Mood Preoccupied;Suspicious  Aggressive Behavior  Effect No apparent injury  Thought Process  Coherency Circumstantial  Content Preoccupation  Delusions Paranoid  Perception Hallucinations  Hallucination None reported or observed  Judgment Poor  Confusion None  Danger to Self  Current suicidal ideation? Denies  Danger to Others  Danger to Others None reported or observed

## 2021-03-21 NOTE — Progress Notes (Signed)
[facility] MD Progress Note  03/21/2021 11:43 AM Brandon [name]  MRN:  [id number]  Subjective: Patient is seen and examined today. Patient was lying in his room naked without pants.  Technician helped him to cover himself with a sheet.  He states that his mood is "good".  Patient talks in short sentences and with yes and no answers.  No staring noted during exam.  He states that he slept well last night.  He states that he has been eating good. He denies SI, HI, AVH.  He denies any hallucinations.  He denies any urinary incontinence, pain or burning during urination.  He reported increased urination states that he has been going to bathroom about 12 times a day.  When asked about how much he is drinking.  He states that he has been drinking about 24 bottles of water. He denies any pain, headache, nausea, vomiting, dizziness, abdominal pain, constipation, diarrhea.  he denies any side effects from Clozaril.  He denies any drooling or constipation. He is oriented to place, time, person.  He knows month, date, and year.  He is able to tell me days of the week backwards.  He is able to tell me months of the year backwards with a lot of thinking. Per nursing staff, he was seen responding to internal stimuli, he was observed yelling with inappropriate laughter but verbally redirectable.  He reported urinary incontinence to staff.  He did not attend any groups.  Diagnosis: Principal Problem:   Schizophrenia, chronic with acute exacerbation (HCC) Active Problems:   Obesity, Class III, BMI 40-49.9 (morbid obesity) (HCC)   Unsteady gait   Multiple sclerosis (HCC)   Schizophrenia, disorganized type (HCC)  Total Time Spent in Direct Patient Care: 25 minutes  Past Psychiatric History: See H&P  Past Medical History:  Past Medical History:  Diagnosis Date   ADHD (attention deficit hyperactivity disorder)    Bipolar 1 disorder (HCC)    Chronic back pain    Chronic constipation    Chronic neck pain     Hypertension    Multiple sclerosis (HCC) 05/20/2013   left sided weakness, dysarthria   Non-compliance    Obesity    Pulmonary embolism (HCC)    Schizophrenia (HCC)    Stroke (HCC)    left sided deficits - pt's mother denies this    Past Surgical History:  Procedure Laterality Date   NO PAST SURGERIES     None     RADIOLOGY WITH ANESTHESIA N/A 01/16/2021   Procedure: MRI WITH ANESTHESIA CERVICAL AND THORASIC SPINE WITH AND WITHOUT CONTRAST;  Surgeon: Radiologist, Medication, MD;  Location: [facility] OR;  Service: Radiology;  Laterality: N/A;   TOOTH EXTRACTION N/A 06/24/2019   Procedure: DENTAL RESTORATION/EXTRACTION OF TEETH NUMBER ONE, SIXTEEN, SEVENTEEN, NINETEEN, THIRTY-TWO;  Surgeon: Ocie Doyne, DDS;  Location: [facility] OR;  Service: Oral Surgery;  Laterality: N/A;   Family History:  Family History  Problem Relation Age of Onset   Diabetes Mother    ADD / ADHD Brother    Family Psychiatric  History: See H&P Social History:  Social History   Substance and Sexual Activity  Alcohol Use Not Currently   Alcohol/week: 0.0 standard drinks   Comment: "A little bit"      Social History   Substance and Sexual Activity  Drug Use Not Currently   Types: Marijuana   Comment: Last used: unknown     Social History   Socioeconomic History   Marital status: Single  Spouse name: Not on file   Number of children: 0   Years of education: 11th   Highest education level: Not on file  Occupational History   Occupation: unemployed    Employer: [employer]    Comment: Disbaled  Tobacco Use   Smoking status: Every Day    Packs/day: 0.25    Types: Cigarettes   Smokeless tobacco: Never   Tobacco comments:    2 cigarettes a day  Vaping Use   Vaping Use: Never used  Substance and Sexual Activity   Alcohol use: Not Currently    Alcohol/week: 0.0 standard drinks    Comment: "A little bit"    Drug use: Not Currently    Types: Marijuana    Comment: Last used: unknown    Sexual  activity: Not on file  Other Topics Concern   Not on file  Social History Narrative   Patient lives at home with his mother.   Disabled.   Education 11 th grade .   Right handed.   Drinks caffeine occassionally   Social Determinants of Corporate investment banker Strain: Not on file  Food Insecurity: Not on file  Transportation Needs: Not on file  Physical Activity: Not on file  Stress: Not on file  Social Connections: Not on file    Sleep: Fair 5.25 hours Appetite:  Good  Current Medications: Current Facility-Administered Medications  Medication Dose Route Frequency Provider Last Rate Last Admin   acetaminophen (TYLENOL) tablet 650 mg  650 mg Oral Q6H PRN Rankin, Shuvon B, NP   650 mg at 03/13/21 2234   alum & mag hydroxide-simeth (MAALOX/MYLANTA) 200-200-20 MG/5ML suspension 30 mL  30 mL Oral Q4H PRN Rankin, Shuvon B, NP   30 mL at 03/14/21 1651   amLODipine (NORVASC) tablet 5 mg  5 mg Oral Daily Nira Conn A, NP   5 mg at 03/21/21 1008   apixaban (ELIQUIS) tablet 5 mg  5 mg Oral BID Nira Conn A, NP   5 mg at 03/21/21 1007   asenapine (SAPHRIS) sublingual tablet 5 mg  5 mg Sublingual BID PRN Dahlia Byes C, NP       asenapine (SAPHRIS) sublingual tablet 5 mg  5 mg Sublingual Daily Muhamad Serano, MD   5 mg at 03/20/21 1357   cloZAPine (CLOZARIL) tablet 150 mg  150 mg Oral QHS Karsten Ro, MD       cloZAPine (CLOZARIL) tablet 50 mg  50 mg Oral q morning Cinderella, Margaret A   50 mg at 03/21/21 1009   docusate sodium (COLACE) capsule 400 mg  400 mg Oral QHS Nira Conn A, NP   400 mg at 03/20/21 2121   latanoprost (XALATAN) 0.005 % ophthalmic solution 1 drop  1 drop Both Eyes QHS Nira Conn A, NP   1 drop at 03/20/21 2123   loratadine (CLARITIN) tablet 10 mg  10 mg Oral Daily Nira Conn A, NP   10 mg at 03/21/21 1008   LORazepam (ATIVAN) tablet 1 mg  1 mg Oral BID Eliseo Gum B, MD   1 mg at 03/21/21 1006   magnesium hydroxide (MILK OF MAGNESIA) suspension  30 mL  30 mL Oral Daily PRN Rankin, Shuvon B, NP       metFORMIN (GLUCOPHAGE-XR) 24 hr tablet 500 mg  500 mg Oral BID WC Nira Conn A, NP   500 mg at 03/21/21 1006   metoprolol succinate (TOPROL-XL) 24 hr tablet 25 mg  25 mg Oral Daily  Mariel Craft, MD   25 mg at 03/21/21 1008   multivitamin with minerals tablet 1 tablet  1 tablet Oral Daily Nira Conn A, NP   1 tablet at 03/21/21 1007   nicotine (NICODERM CQ - dosed in mg/24 hours) patch 14 mg  14 mg Transdermal Daily Nira Conn A, NP   14 mg at 03/21/21 1035   topiramate (TOPAMAX) tablet 25 mg  25 mg Oral BID Nira Conn A, NP   25 mg at 03/21/21 1007    Lab Results:  Results for orders placed or performed during the hospital encounter of 03/05/21 (from the past 48 hour(s))  Glucose, capillary     Status: Abnormal   Collection Time: 03/19/21  1:14 PM  Result Value Ref Range   Glucose-Capillary 158 (H) 70 - 99 mg/dL    Comment: Glucose reference range applies only to samples taken after fasting for at least 8 hours.  Glucose, capillary     Status: Abnormal   Collection Time: 03/19/21  6:14 PM  Result Value Ref Range   Glucose-Capillary 158 (H) 70 - 99 mg/dL    Comment: Glucose reference range applies only to samples taken after fasting for at least 8 hours.  Glucose, capillary     Status: Abnormal   Collection Time: 03/20/21  6:47 AM  Result Value Ref Range   Glucose-Capillary 105 (H) 70 - 99 mg/dL    Comment: Glucose reference range applies only to samples taken after fasting for at least 8 hours.  Glucose, capillary     Status: Abnormal   Collection Time: 03/20/21 11:54 AM  Result Value Ref Range   Glucose-Capillary 152 (H) 70 - 99 mg/dL    Comment: Glucose reference range applies only to samples taken after fasting for at least 8 hours.   Comment 1 Notify RN   Glucose, capillary     Status: Abnormal   Collection Time: 03/20/21  4:56 PM  Result Value Ref Range   Glucose-Capillary 153 (H) 70 - 99 mg/dL    Comment:  Glucose reference range applies only to samples taken after fasting for at least 8 hours.   Comment 1 Notify RN   CBC with Differential/Platelet     Status: Abnormal   Collection Time: 03/20/21  6:33 PM  Result Value Ref Range   WBC 8.3 4.0 - 10.5 K/uL   RBC 5.19 4.22 - 5.81 MIL/uL   Hemoglobin 13.4 13.0 - 17.0 g/dL   HCT 50.9 32.6 - 71.2 %   MCV 85.5 80.0 - 100.0 fL   MCH 25.8 (L) 26.0 - 34.0 pg   MCHC 30.2 30.0 - 36.0 g/dL   RDW 45.8 09.9 - 83.3 %   Platelets 218 150 - 400 K/uL   nRBC 0.0 0.0 - 0.2 %   Neutrophils Relative % 62 %   Neutro Abs 5.2 1.7 - 7.7 K/uL   Lymphocytes Relative 24 %   Lymphs Abs 2.0 0.7 - 4.0 K/uL   Monocytes Relative 8 %   Monocytes Absolute 0.6 0.1 - 1.0 K/uL   Eosinophils Relative 4 %   Eosinophils Absolute 0.3 0.0 - 0.5 K/uL   Basophils Relative 1 %   Basophils Absolute 0.1 0.0 - 0.1 K/uL   Immature Granulocytes 1 %   Abs Immature Granulocytes 0.12 (H) 0.00 - 0.07 K/uL    Comment: Performed at [facility], [address] [place], [place] [address]  Glucose, capillary     Status: Abnormal   Collection  Time: 03/21/21  5:35 AM  Result Value Ref Range   Glucose-Capillary 122 (H) 70 - 99 mg/dL    Comment: Glucose reference range applies only to samples taken after fasting for at least 8 hours.  Glucose, capillary     Status: Abnormal   Collection Time: 03/21/21 11:22 AM  Result Value Ref Range   Glucose-Capillary 101 (H) 70 - 99 mg/dL    Comment: Glucose reference range applies only to samples taken after fasting for at least 8 hours.    Blood Alcohol level:  Lab Results  Component Value Date   [facility] <10 03/04/2021   ETH <10 09/13/2020    Metabolic Disorder Labs: Lab Results  Component Value Date   HGBA1C 5.5 03/07/2021   MPG 111.15 03/07/2021   MPG 123 04/30/2020   Lab Results  Component Value Date   PROLACTIN 13.6 04/30/2020   Lab Results  Component Value Date   CHOL 151 03/07/2021   TRIG 154 (H) 03/07/2021    HDL 32 (L) 03/07/2021   CHOLHDL 4.7 03/07/2021   VLDL 31 03/07/2021   LDLCALC 88 03/07/2021   LDLCALC 77 04/30/2020    Physical Findings:  Musculoskeletal: Strength & Muscle Tone: within normal limits Gait & Station: Not tested.  Unsteady before.  getting OT Patient leans: N/A  Psychiatric Specialty Exam:  Presentation  General Appearance: Appropriate for environment Eye Contact:Fair Speech:Slow dysarthric, mumbling, occasional speech latency Speech Volume:Normal Handedness:Right   Mood and Affect  Mood:" Good" Affect:Blunt   Thought Process  Thought Processes: some blocking, superficially goal directed Descriptions of Associations:Intact  Orientation:Full (Time, Place and Person)   Thought Content: Reports hearing music and then denies AVH. He would not cooperate to answer questions about ideas of reference, first rank symptoms, paranoia - no delusional comments noted; is not grossly responding to internal stimuli on exam. Pt was seen by staff yelling with inappropriate laughter. Verbally redirectable  History of Schizophrenia/Schizoaffective disorder:Yes  Duration of Psychotic Symptoms:Greater than six months  Hallucinations:Hallucinations: Denies   Ideas of Reference:None  Suicidal Thoughts:Suicidal Thoughts: No  Homicidal Thoughts:Homicidal Thoughts: No   Sensorium  Memory:Immediate Good; Recent Fair; Remote Fair  Judgment:Fair - compliant with meds  Insight:lacking   Executive Functions  Concentration:Fair  Attention Span:Fair  Recall:Untested today  Fund of Knowledge:Fair  Language:Fair   Psychomotor Activity  Psychomotor Activity:Psychomotor Activity: Normal   Assets  Assets:Communication Skills; Desire for Improvement; Resilience; Social Support   Sleep  Sleep:6.5 hours  Physical Exam: Physical Exam Constitutional:      Appearance: He is obese.  HENT:     Head: Normocephalic and atraumatic.  Pulmonary:     Effort:  Pulmonary effort is normal.  Neurological:     Mental Status: He is alert and oriented to person, place, and time.   Review of Systems  Constitutional:  Negative for fever.  HENT:  Negative for sore throat.   Respiratory:  Negative for cough.   Cardiovascular:  Negative for chest pain.  Gastrointestinal:  Negative for constipation.  Genitourinary:  Positive for frequency. Negative for dysuria.  Psychiatric/Behavioral:  Negative for hallucinations and suicidal ideas. The patient is not nervous/anxious.   Blood pressure 134/88, pulse 100, temperature 98.2 F (36.8 C), temperature source Oral, resp. rate 18, height 5\' 11"  (1.803 m), weight (!) 145.2 kg, SpO2 100 %. Body mass index is 44.63 kg/m.   Treatment Plan Summary: Daily contact with patient to assess and evaluate symptoms and progress in treatment and Medication management Mr. Patalano is a  30 yr old male who presents under IVC for acute psychosis in setting of treatment resistance (mother verifies pill swallows daily). Had been on 2 antipsychotics (last haldol LAI dispensed early August, saphris 10 TID). In review of chart, has typically not responded well to most antipsychotics with documented compliance. He was previously on clozapine 2-3 years ago with good response, stopped d/t drooling per pt and mother. We discussed restarting this with his mother and pt who provided oral consent/assent to restart clozapine. We are slowly tapering him off of saphris given it has been ineffective and he has a vulnerable brain d/t MS and prior stroke. Ideally we would like to see him on clozapine as monotherapy (plus medication to ameliorate side effects if necessary) and are working towards this goal. Lately staff have been reporting increased disorganization and need for redirection mostly during evening/night time. Saphris was reduced to once daily dose. Daytime Clozaril 50 mg was also added to the regime .  Saphris PRN is also available for agitation  but patient did not require it. We'll Increase Clozaril to 150 mg nightly.  Patient also reporting urinary incontinence to nursing staff.  There are some studies showing the use of ephedrine for urinary incontinence in patients with clozapine.  We will explore it further and confirm with neurology before starting it.  Message sent to Dr. Anne Hahn.  Schizophrenia, Chronic with acute exacerbation.  - Continue Saphris 5 mg once daily ( at 2 pm daily to decrease evening agitation/psychosis ).- Goal of discontinuing as Clozaril becomes therapeutic - Saphris 5 mg twice daily as needed for agitation. - Increase Clozaril to 150 mg QHS and continue Clozaril 50 mg in morning. - will reassess for continued upward titration as needed (Troponin I 4 on 9/3 and CRP 1.9 on 9/3; WBC 8.8 with ANC 5600 on 9/8, Troponin T 7 on 9/8). Will repeat CBC with diff, CRP and Troponin on Thursdays.  - Last EKG  03/16/2021 is NSR, QTC interval 420 -CBC(9/14)-with in normal limits, CRP, troponin ordered, pending.   New onset urinary incontinence Nursing order - Behavior modification to avoid urinary incontinence.  -Voiding before bedtime.  -Remind him to void at least 3-4 times in a day.  -Avoid fluids before sleep -There are some studies showing the use of ephedrine for urinary incontinence in patients with clozapine. We will start ephedrine if behavior modification fails. We will confirm with neurology and inform mom before starting it.  Message sent to Dr. Anne Hahn.  Catatonia  -Continue Ativan 1 mg twice daily. - Topamax (incidentally on for wt loss) also tx catatonia, clozapine has data in catatonia as well.    Nicotine Dependence: -Continue Nicotine Patch 14 mg daily   HTN: -Continue Amlodipine 5 mg daily. - Continue Toprol XL 25mg  daily   H/o PE -Continue Eliquis 5 mg BID   Chronic Constipation -Continue Colace 400 mg QHS   Obesity: -Continue Topamax 25 mg BID and continue Metformin 24 hr 500 mg BID as  written by PCP   MS/gait disorder: - Reportedly getting Ocrevus by neurology was due on 9/8. - Infusion for MS got cancelled due to inpatient /outpatient reimbursement issues. Mom informed and ok for him to get it after discharge.  - Home Health OT at D/c.   Other home meds: -Continue Xalatan 0.005% 1 drop both eyes QHS -Continue Claritin 10 mg daily   -DO NOT START TRAZODONE OR GABAPENTIN AS IT WAS STOPPED BY NEUROLOGY DUE TO GAIT INSTABILITY.   -Continue PRN's: Tylenol,  Maalox, Atarax, Milk of Magnesia  Karsten Ro, MD PGY2 03/21/2021, 11:43 AM

## 2021-03-21 NOTE — Progress Notes (Signed)
The patient rated his day as a 6 out of a possible 10. His goal for today was to "keep bad vibes away".

## 2021-03-21 NOTE — Progress Notes (Signed)
   03/21/21 1400  Psych Admission Type (Psych Patients Only)  Admission Status Other (Comment)  Psychosocial Assessment  Patient Complaints None  Eye Contact Fair  Facial Expression Flat  Affect Preoccupied  Speech Soft  Interaction Minimal  Motor Activity Slow  Appearance/Hygiene Improved;Poor hygiene  Behavior Characteristics Cooperative  Mood Preoccupied  Aggressive Behavior  Effect No apparent injury  Thought Process  Coherency Circumstantial  Content Preoccupation  Delusions Paranoid  Perception Hallucinations  Hallucination None reported or observed  Judgment Poor  Confusion None  Danger to Self  Current suicidal ideation? Denies  Danger to Others  Danger to Others None reported or observed

## 2021-03-22 DIAGNOSIS — F201 Disorganized schizophrenia: Secondary | ICD-10-CM | POA: Diagnosis not present

## 2021-03-22 LAB — GLUCOSE, CAPILLARY
Glucose-Capillary: 126 mg/dL — ABNORMAL HIGH (ref 70–99)
Glucose-Capillary: 103 mg/dL — ABNORMAL HIGH (ref 70–99)
Glucose-Capillary: 49 mg/dL — ABNORMAL LOW (ref 70–99)
Glucose-Capillary: 117 mg/dL — ABNORMAL HIGH (ref 70–99)
Glucose-Capillary: 145 mg/dL — ABNORMAL HIGH (ref 70–99)
Glucose-Capillary: 140 mg/dL — ABNORMAL HIGH (ref 70–99)

## 2021-03-22 MED ORDER — GLUCOSE 40 % PO GEL
ORAL | Status: AC
  Administered 2021-03-22: 1
  Filled 2021-03-22: qty 1.21

## 2021-03-22 MED ORDER — LORAZEPAM 0.5 MG PO TABS
0.5000 mg | ORAL_TABLET | Freq: Two times a day (BID) | ORAL | Status: AC
  Administered 2021-03-23 – 2021-03-24 (×4): 0.5 mg via ORAL
  Filled 2021-03-22 (×4): qty 1

## 2021-03-22 MED ORDER — LORAZEPAM 0.5 MG PO TABS: 0.5000 mg | ORAL_TABLET | Freq: Two times a day (BID) | ORAL | Status: DC

## 2021-03-22 MED ORDER — LORAZEPAM 0.5 MG PO TABS: 0.5000 mg | ORAL_TABLET | Freq: Every day | ORAL | Status: DC

## 2021-03-22 MED ORDER — ARIPIPRAZOLE 10 MG PO TABS
10.0000 mg | ORAL_TABLET | Freq: Every day | ORAL | Status: DC
  Administered 2021-03-22 – 2021-03-25 (×4): 10 mg via ORAL
  Filled 2021-03-22 (×7): qty 1

## 2021-03-22 NOTE — Group Note (Deleted)
LCSW Group Therapy Note   Group Date: 03/22/2021 Start Time: 1300 End Time: 1400   Type of Therapy and Topic:  Group Therapy:   Participation Level:  {[facility] PARTICIPATION LEVEL:22264}  Description of Group:   Therapeutic Goals:  1.     Summary of Patient Progress:    ***  Therapeutic Modalities:   Adan Baehr A Tyson Masin, LCSWA 03/22/2021  2:06 PM    

## 2021-03-22 NOTE — Progress Notes (Signed)
Pt. Is awake and is talking loudly to himself. He has been redirected three times already.

## 2021-03-22 NOTE — Progress Notes (Addendum)
[facility] MD Progress Note  03/22/2021 4:31 PM Brandon [name]  MRN:  [id number]  Subjective: Patient is seen and examined today. Patient was lying in his room looks sleepy and drowsy.  He states that his mood is "good".  Patient talks in short sentences and with yes and no answers.  No staring noted during exam.  He states that he slept well last night.  He states that he has been eating good. He denies SI, HI, AVH.  He denies any hallucinations.  He denies any pain, headache, nausea, vomiting, dizziness, abdominal pain, constipation, diarrhea. He denies any side effects from Clozaril.  He denies any drooling or constipation. He is oriented to place, and person.  He knows month, and year but not date.  He states that is September 15.  Per nursing staff, yesterday he was talking to himself loudly and needed redirection 3 times. Called Mom [name] @  [phone] and discussed about stopping Saphris and starting Abilify 10 mg daily for urinary incontinence. Mom is okay with that.  Discussed the possibility of starting desmopressin 0.1 mg nightly if Abilify does not help.  Mom states she does not want him on multiple medications and all drugged up.  Discussed that we will see if Abilify helps with urinary incontinence but if not then we would like to start desmopressin.  Mom agrees with the plan and gave consent to start medication today.   Diagnosis: Principal Problem:   Schizophrenia, chronic with acute exacerbation (HCC) Active Problems:   Obesity, Class III, BMI 40-49.9 (morbid obesity) (HCC)   Unsteady gait   Multiple sclerosis (HCC)   Schizophrenia, disorganized type (HCC)  Total Time Spent in Direct Patient Care: 25 minutes  Past Psychiatric History: See H&P  Past Medical History:  Past Medical History:  Diagnosis Date   ADHD (attention deficit hyperactivity disorder)    Bipolar 1 disorder (HCC)    Chronic back pain    Chronic constipation    Chronic neck pain    Hypertension     Multiple sclerosis (HCC) 05/20/2013   left sided weakness, dysarthria   Non-compliance    Obesity    Pulmonary embolism (HCC)    Schizophrenia (HCC)    Stroke (HCC)    left sided deficits - pt's mother denies this    Past Surgical History:  Procedure Laterality Date   NO PAST SURGERIES     None     RADIOLOGY WITH ANESTHESIA N/A 01/16/2021   Procedure: MRI WITH ANESTHESIA CERVICAL AND THORASIC SPINE WITH AND WITHOUT CONTRAST;  Surgeon: Radiologist, Medication, MD;  Location: [facility] OR;  Service: Radiology;  Laterality: N/A;   TOOTH EXTRACTION N/A 06/24/2019   Procedure: DENTAL RESTORATION/EXTRACTION OF TEETH NUMBER ONE, SIXTEEN, SEVENTEEN, NINETEEN, THIRTY-TWO;  Surgeon: Ocie Doyne, DDS;  Location: [facility] OR;  Service: Oral Surgery;  Laterality: N/A;   Family History:  Family History  Problem Relation Age of Onset   Diabetes Mother    ADD / ADHD Brother    Family Psychiatric  History: See H&P Social History:  Social History   Substance and Sexual Activity  Alcohol Use Not Currently   Alcohol/week: 0.0 standard drinks   Comment: "A little bit"      Social History   Substance and Sexual Activity  Drug Use Not Currently   Types: Marijuana   Comment: Last used: unknown     Social History   Socioeconomic History   Marital status: Single    Spouse name: Not on file  Number of children: 0   Years of education: 11th   Highest education level: Not on file  Occupational History   Occupation: unemployed    Employer: [employer]    Comment: Disbaled  Tobacco Use   Smoking status: Every Day    Packs/day: 0.25    Types: Cigarettes   Smokeless tobacco: Never   Tobacco comments:    2 cigarettes a day  Vaping Use   Vaping Use: Never used  Substance and Sexual Activity   Alcohol use: Not Currently    Alcohol/week: 0.0 standard drinks    Comment: "A little bit"    Drug use: Not Currently    Types: Marijuana    Comment: Last used: unknown    Sexual activity: Not on file   Other Topics Concern   Not on file  Social History Narrative   Patient lives at home with his mother.   Disabled.   Education 11 th grade .   Right handed.   Drinks caffeine occassionally   Social Determinants of Corporate investment banker Strain: Not on file  Food Insecurity: Not on file  Transportation Needs: Not on file  Physical Activity: Not on file  Stress: Not on file  Social Connections: Not on file    Sleep: Good  Appetite:  Good  Current Medications: Current Facility-Administered Medications  Medication Dose Route Frequency Provider Last Rate Last Admin   acetaminophen (TYLENOL) tablet 650 mg  650 mg Oral Q6H PRN Rankin, Shuvon B, NP   650 mg at 03/13/21 2234   alum & mag hydroxide-simeth (MAALOX/MYLANTA) 200-200-20 MG/5ML suspension 30 mL  30 mL Oral Q4H PRN Rankin, Shuvon B, NP   30 mL at 03/14/21 1651   amLODipine (NORVASC) tablet 5 mg  5 mg Oral Daily Nira Conn A, NP   5 mg at 03/22/21 0948   apixaban (ELIQUIS) tablet 5 mg  5 mg Oral BID Nira Conn A, NP   5 mg at 03/22/21 0949   ARIPiprazole (ABILIFY) tablet 10 mg  10 mg Oral Daily Doda, Traci Sermon, MD   10 mg at 03/22/21 1352   asenapine (SAPHRIS) sublingual tablet 5 mg  5 mg Sublingual BID PRN Dahlia Byes C, NP       cloZAPine (CLOZARIL) tablet 150 mg  150 mg Oral QHS Karsten Ro, MD   150 mg at 03/21/21 2129   cloZAPine (CLOZARIL) tablet 50 mg  50 mg Oral q morning Cinderella, Margaret A   50 mg at 03/22/21 0947   docusate sodium (COLACE) capsule 400 mg  400 mg Oral QHS Nira Conn A, NP   400 mg at 03/21/21 2129   latanoprost (XALATAN) 0.005 % ophthalmic solution 1 drop  1 drop Both Eyes QHS Nira Conn A, NP   1 drop at 03/21/21 2131   loratadine (CLARITIN) tablet 10 mg  10 mg Oral Daily Nira Conn A, NP   10 mg at 03/22/21 0948   LORazepam (ATIVAN) tablet 1 mg  1 mg Oral BID Eliseo Gum B, MD   1 mg at 03/22/21 7628   magnesium hydroxide (MILK OF MAGNESIA) suspension 30 mL  30 mL Oral  Daily PRN Rankin, Shuvon B, NP       metFORMIN (GLUCOPHAGE-XR) 24 hr tablet 500 mg  500 mg Oral BID WC Nira Conn A, NP   500 mg at 03/22/21 0947   metoprolol succinate (TOPROL-XL) 24 hr tablet 25 mg  25 mg Oral Daily Mariel Craft, MD  25 mg at 03/22/21 0949   multivitamin with minerals tablet 1 tablet  1 tablet Oral Daily Nira Conn A, NP   1 tablet at 03/22/21 0947   nicotine (NICODERM CQ - dosed in mg/24 hours) patch 14 mg  14 mg Transdermal Daily Nira Conn A, NP   14 mg at 03/21/21 1035   topiramate (TOPAMAX) tablet 25 mg  25 mg Oral BID Nira Conn A, NP   25 mg at 03/22/21 4034    Lab Results:  Results for orders placed or performed during the hospital encounter of 03/05/21 (from the past 48 hour(s))  Glucose, capillary     Status: Abnormal   Collection Time: 03/20/21  4:56 PM  Result Value Ref Range   Glucose-Capillary 153 (H) 70 - 99 mg/dL    Comment: Glucose reference range applies only to samples taken after fasting for at least 8 hours.   Comment 1 Notify RN   CBC with Differential/Platelet     Status: Abnormal   Collection Time: 03/20/21  6:33 PM  Result Value Ref Range   WBC 8.3 4.0 - 10.5 K/uL   RBC 5.19 4.22 - 5.81 MIL/uL   Hemoglobin 13.4 13.0 - 17.0 g/dL   HCT 74.2 59.5 - 63.8 %   MCV 85.5 80.0 - 100.0 fL   MCH 25.8 (L) 26.0 - 34.0 pg   MCHC 30.2 30.0 - 36.0 g/dL   RDW 75.6 43.3 - 29.5 %   Platelets 218 150 - 400 K/uL   nRBC 0.0 0.0 - 0.2 %   Neutrophils Relative % 62 %   Neutro Abs 5.2 1.7 - 7.7 K/uL   Lymphocytes Relative 24 %   Lymphs Abs 2.0 0.7 - 4.0 K/uL   Monocytes Relative 8 %   Monocytes Absolute 0.6 0.1 - 1.0 K/uL   Eosinophils Relative 4 %   Eosinophils Absolute 0.3 0.0 - 0.5 K/uL   Basophils Relative 1 %   Basophils Absolute 0.1 0.0 - 0.1 K/uL   Immature Granulocytes 1 %   Abs Immature Granulocytes 0.12 (H) 0.00 - 0.07 K/uL    Comment: Performed at [facility], [address] [place], [place] [address]  Glucose,  capillary     Status: Abnormal   Collection Time: 03/21/21  5:35 AM  Result Value Ref Range   Glucose-Capillary 122 (H) 70 - 99 mg/dL    Comment: Glucose reference range applies only to samples taken after fasting for at least 8 hours.  Glucose, capillary     Status: Abnormal   Collection Time: 03/21/21 11:22 AM  Result Value Ref Range   Glucose-Capillary 101 (H) 70 - 99 mg/dL    Comment: Glucose reference range applies only to samples taken after fasting for at least 8 hours.  Glucose, capillary     Status: Abnormal   Collection Time: 03/21/21  5:00 PM  Result Value Ref Range   Glucose-Capillary 188 (H) 70 - 99 mg/dL    Comment: Glucose reference range applies only to samples taken after fasting for at least 8 hours.  C-reactive protein     Status: Abnormal   Collection Time: 03/21/21  6:21 PM  Result Value Ref Range   CRP 2.6 (H) <1.0 mg/dL    Comment: Performed at [facility], [address] [place], [place] [address]  Troponin I (High Sensitivity)     Status: None   Collection Time: 03/21/21  6:21 PM  Result Value Ref Range   Troponin I (High  Sensitivity) 3 <18 ng/L    Comment: (NOTE) Elevated high sensitivity troponin I (hsTnI) values and significant  changes across serial measurements may suggest ACS but many other  chronic and acute conditions are known to elevate hsTnI results.  Refer to the "Links" section for chest pain algorithms and additional  guidance. Performed at [facility], [address] [place], [place] [address]   Glucose, capillary     Status: Abnormal   Collection Time: 03/22/21  5:29 AM  Result Value Ref Range   Glucose-Capillary 126 (H) 70 - 99 mg/dL    Comment: Glucose reference range applies only to samples taken after fasting for at least 8 hours.  Glucose, capillary     Status: Abnormal   Collection Time: 03/22/21 12:16 PM  Result Value Ref Range   Glucose-Capillary 103 (H) 70 - 99 mg/dL    Comment:  Glucose reference range applies only to samples taken after fasting for at least 8 hours.    Blood Alcohol level:  Lab Results  Component Value Date   [facility] <10 03/04/2021   ETH <10 09/13/2020    Metabolic Disorder Labs: Lab Results  Component Value Date   HGBA1C 5.5 03/07/2021   MPG 111.15 03/07/2021   MPG 123 04/30/2020   Lab Results  Component Value Date   PROLACTIN 13.6 04/30/2020   Lab Results  Component Value Date   CHOL 151 03/07/2021   TRIG 154 (H) 03/07/2021   HDL 32 (L) 03/07/2021   CHOLHDL 4.7 03/07/2021   VLDL 31 03/07/2021   LDLCALC 88 03/07/2021   LDLCALC 77 04/30/2020    Physical Findings:  Musculoskeletal: Strength & Muscle Tone: within normal limits Gait & Station: Not tested.  Unsteady before.  getting OT Patient leans: N/A  Psychiatric Specialty Exam:  Presentation  General Appearance: In hospital gown; fair hygiene Eye Contact:Fair Speech:Slow dysarthric, mumbling Speech Volume:Normal Handedness:Right   Mood and Affect  Mood:" Good" Affect:constricted but calm and cooperative   Thought Process  Thought Processes: superficially goal directed, concrete Descriptions of Associations:Intact  Orientation:Partial (Knows month and year does not know date.  Thinks it is September 15)   Thought Content: Denies AVH or delusions and denies paranoia - is not grossly responding to internal/external stimuli on exam  History of Schizophrenia/Schizoaffective disorder:Yes  Duration of Psychotic Symptoms:Greater than six months  Hallucinations:Hallucinations: Denies  Suicidal Thoughts:Suicidal Thoughts: No  Homicidal Thoughts:Homicidal Thoughts: No   Sensorium  Memory:Fair  Judgment:Fair - compliant with meds  Insight:lacking   Executive Functions  Concentration:Fair  Attention Span:Fair  Recall:Untested today  Fund of Knowledge:Fair  Language:Fair   Psychomotor Activity  Psychomotor Activity: Decreased today - resting in  bed   Assets  Assets:Communication Skills; Desire for Improvement; Resilience; Social Support   Sleep  Sleep:6.5 hours  Physical Exam: Physical Exam Constitutional:      Appearance: He is obese.  HENT:     Head: Normocephalic and atraumatic.  Pulmonary:     Effort: Pulmonary effort is normal.  Neurological:     Mental Status: He is alert and oriented to person, place, and time.   Review of Systems  Constitutional:  Negative for fever.  HENT:  Negative for sore throat.   Respiratory:  Negative for cough.   Cardiovascular:  Negative for chest pain.  Gastrointestinal:  Negative for constipation.  Genitourinary:  Positive for frequency. Negative for dysuria.  Psychiatric/Behavioral:  Negative for hallucinations and suicidal ideas. The patient is not nervous/anxious.   Blood pressure 119/80,  pulse 99, temperature 98.2 F (36.8 C), temperature source Oral, resp. rate 18, height 5\' 11"  (1.803 m), weight (!) 145.2 kg, SpO2 100 %. Body mass index is 44.63 kg/m.   Treatment Plan Summary: Daily contact with patient to assess and evaluate symptoms and progress in treatment and Medication management  Mr. Bedoya is a 30 yr old male who presents under IVC for acute psychosis in setting of treatment resistance (mother verifies pill swallows daily). Had been on 2 antipsychotics (last haldol [facility] dispensed early August, saphris 10 TID). In review of chart, has typically not responded well to most antipsychotics with documented compliance. He was previously on clozapine 2-3 years ago with good response, stopped d/t drooling per pt and mother. We discussed restarting this with his mother and pt who provided oral consent/assent to restart clozapine. We have been slowly tapering him off of saphris given it has been ineffective and he has a vulnerable brain d/t MS and prior stroke. Patient has been clinically improving with dose titration up on Clozaril but has developed urinary incontinence on this  medication. There are some studies showing the use of Abilify, desmopressin, and ephedrine for urinary incontinence in patients with clozapine.  Mom gave consent to start Abilify today to see if enuresis improves.  Schizophrenia, Chronic with acute exacerbation.  - Stop Saphris and start Abilify 10 mg daily to help with psychosis and urinary incontinence. - Continue Clozaril 150 mg QHS and continue Clozaril 50 mg in morning- will reassess for continued upward titration as needed - Monitoring weekly CBC, CRP and Troponin with Clozaril titration  Troponin 3 (9/15), 7 (9/8), 4 (9/3)  CRP 2.6 (9/15), 1.9 (9/3), 6.9 (07/20/20)  CBC/ANC 8.09/5198 (9/14), 8.02/5599 (9/8) 7.08/4898 (9/2)  Clozaril level 141 (9/12) - Last EKG  03/16/2021 is NSR, QTC interval 420 - will recheck EKG tomorrow for QTC monitoring  New onset urinary incontinence Nursing order - Behavior modification to avoid urinary incontinence.  -Voiding before bedtime.  -Remind him to void at least 3-4 times in a day.  -Avoid fluids before sleep -Start Abilify 10 mg daily.  Mom gave consent to start Abilify. -Plan to start desmopressin 0.1 mg nightly if Abilify does not help with urinary incontinence.  Catatonia  -Reduce Ativan to 0.5 mg twice daily for 3 days.  Then 0.5 mg daily for 3 days and then stop.  Patient drowsiness may be related to Ativan.  - Topamax (incidentally on for wt loss) also tx catatonia, clozapine has data in catatonia as well.    Nicotine Dependence: -Continue Nicotine Patch 14 mg daily   HTN: -Continue Amlodipine 5 mg daily. - Continue Toprol XL 25mg  daily   H/o PE -Continue Eliquis 5 mg BID   Chronic Constipation -Continue Colace 400 mg QHS   Obesity: -Continue Topamax 25 mg BID and continue Metformin 24 hr 500 mg BID as written by PCP   MS/gait disorder: - Reportedly getting Ocrevus by neurology was due on 9/8. - Infusion for MS got cancelled due to inpatient /outpatient reimbursement issues. Mom  informed and ok for him to get it after discharge.  - Home Health OT at D/c.   Other home meds: -Continue Xalatan 0.005% 1 drop both eyes QHS -Continue Claritin 10 mg daily   -DO NOT START TRAZODONE OR GABAPENTIN AS IT WAS STOPPED BY NEUROLOGY DUE TO GAIT INSTABILITY.   -Continue PRN's: Tylenol, Maalox, Atarax, Milk of Magnesia  , MD PGY2 03/22/2021, 4:31 PM

## 2021-03-22 NOTE — Group Note (Signed)
Recreation Therapy Group Note   Group Topic:Communication  Group Date: 03/22/2021 Start Time: 1000 End Time: 1035 Facilitators: Caroll Rancher, LRT/CTRS Location: [address] Dayroom  Goal Area(s) Addresses:  Patient will effectively listen to complete activity.  Patient will identify communication skills used to make activity successful.  Patient will identify how skills used during activity can be used to reach post d/c goals.    Group Description:  Geometric Drawings.  Three volunteers from the peer group will be shown an abstract picture with a particular arrangement of geometrical shapes.  Each round, one 'speaker' will describe the pattern, as accurately as possible without revealing the image to the group.  The remaining group members will listen and draw the picture to reflect how it is described to them. Patients with the role of 'listener' cannot ask clarifying questions but, may request that the speaker repeat a direction. Once the drawings are complete, the presenter will show the rest of the group the picture and compare how close each person came to drawing the picture. LRT will facilitate a post-activity discussion regarding effective communication and the importance of planning, listening, and asking for clarification in daily interactions with others.   Affect/Mood: N/A   Participation Level: Did not attend    Clinical Observations/Individualized Feedback: Pt did not attend group session.    Plan: Continue to engage patient in RT group sessions 2-3x/week.   Caroll Rancher, LRT/CTRS  03/22/2021 12:48 PM

## 2021-03-22 NOTE — [facility] Group Notes (Signed)
Adult Psychoeducational Group Note  Date:  03/22/2021 Time:  11:54 PM  Group Topic/Focus:  Managing Feelings:   The focus of this group is to identify what feelings patients have difficulty handling and develop a plan to handle them in a healthier way upon discharge.  Participation Level:  Active  Participation Quality:  Appropriate  Affect:  Appropriate  Cognitive:  Alert  Insight: Good  Engagement in Group:  Engaged  Modes of Intervention:  Discussion  Additional Comments  Brandon [name] 03/22/2021, 11:54 PM

## 2021-03-22 NOTE — Progress Notes (Signed)
Pt had a syncope episode this evening with low CBG result 49 at 1713. Orange juice given when alert, followed with glucose gel when as pt was unresponsive for about 10 minutes.  Evening dose of Metformin 500 mg held. CBG rechecked per protocol (117 mg/dl at 2080) and 223 mg/dl at 3612 post interventions and pt was A & O to self, place and situation when reassessed. Pt noted to be sedated this morning with drools and difficult to arouse on initial interactions "I'm tired from my medicines. I can't get up, come back later". Nocturnal incontinence continues and treatment team made aware of pt's concerns about his current regimen.  Support and encouragement offered. Q 15 minutes safety checks maintained without falls. All medications given with verbal education and effects monitored. Tolerates all meals and fluids well when offered. Remains compliant with medications as well.

## 2021-03-23 DIAGNOSIS — F201 Disorganized schizophrenia: Secondary | ICD-10-CM | POA: Diagnosis not present

## 2021-03-23 LAB — GLUCOSE, CAPILLARY
Glucose-Capillary: 124 mg/dL — ABNORMAL HIGH (ref 70–99)
Glucose-Capillary: 134 mg/dL — ABNORMAL HIGH (ref 70–99)
Glucose-Capillary: 115 mg/dL — ABNORMAL HIGH (ref 70–99)

## 2021-03-23 MED ORDER — OLANZAPINE 10 MG PO TBDP
10.0000 mg | ORAL_TABLET | Freq: Three times a day (TID) | ORAL | Status: DC | PRN
  Administered 2021-03-23 – 2021-03-27 (×7): 10 mg via ORAL
  Filled 2021-03-23 (×7): qty 1

## 2021-03-23 MED ORDER — LORAZEPAM 1 MG PO TABS
1.0000 mg | ORAL_TABLET | ORAL | Status: AC | PRN
  Administered 2021-03-26: 1 mg via ORAL
  Filled 2021-03-23: qty 1

## 2021-03-23 MED ORDER — INSULIN ASPART 100 UNIT/ML IJ SOLN: 0.0000 [IU] | Freq: Every day | INTRAMUSCULAR | Status: DC

## 2021-03-23 MED ORDER — ZIPRASIDONE MESYLATE 20 MG IM SOLR
20.0000 mg | INTRAMUSCULAR | Status: DC | PRN
Start: 2021-03-23 — End: 2021-03-28

## 2021-03-23 MED ORDER — INSULIN ASPART 100 UNIT/ML IJ SOLN
0.0000 [IU] | Freq: Three times a day (TID) | INTRAMUSCULAR | Status: DC
  Administered 2021-03-24 – 2021-03-26 (×6): 1 [IU] via SUBCUTANEOUS
  Administered 2021-03-27 (×2): 2 [IU] via SUBCUTANEOUS
  Administered 2021-03-28: 1 [IU] via SUBCUTANEOUS

## 2021-03-23 MED ORDER — INSULIN ASPART 100 UNIT/ML IJ SOLN
0.0000 [IU] | Freq: Three times a day (TID) | INTRAMUSCULAR | Status: DC
  Administered 2021-03-23: 3 [IU] via SUBCUTANEOUS

## 2021-03-23 MED ORDER — OLANZAPINE 10 MG PO TBDP
ORAL_TABLET | ORAL | Status: AC
  Administered 2021-03-23: 10 mg via ORAL
  Filled 2021-03-23: qty 1

## 2021-03-23 NOTE — Progress Notes (Addendum)
I assumed care for Brandon [name] at abt 2000 9/16.  He was resting in his room,later seen in the day area with peers. He showered in the evening, ABG as documented, no signs of hyper/hypo glycemia, he took all his scheduled HS meds, denied SI/HI, has been actively responding to internal stimuli/talking to self intermittently overnight, at times laughing inappropriately, follow verbal redirection. No falls this far. He is being monitored as ordered.   Sleep Hrs 1.75

## 2021-03-23 NOTE — Progress Notes (Addendum)
[facility] MD Progress Note  03/23/2021 3:48 PM Brandon [name]  MRN:  [id number]  Subjective: Patient is seen and examined today. Per chart review, patient had no behavioral PRNs overnight; however, this AM, he had an episode in which he became agitated, banging on the door to the unit, and required a as needed of Zyprexa 10 mg p.o. It is also reported that this morning, while urinating, his glasses fell in the toilet, and one of the lenses was flushed before he realized that it was missing.  The lens has been unable to be recovered.  Patient was sitting in the day room, alert.  He states that his mood is "ok".  When asked about his incident this morning, he says that he found out that his aunt passed, and that "I don't handle death well."  No staring noted during exam.  He states that he slept well last night.  He states that he has been eating good.  He denies SI, HI, AVH.  He denies any hallucinations.  He denies any pain, headache, nausea, vomiting, dizziness, abdominal pain, constipation, diarrhea. He denies any side effects from Clozaril.  He denies any drooling or constipation. He also denies any urinary incontinence last night.     Called Mom [name] @  [phone] to inform her of his agitation this morning, as well as his hypoglycemic episode and discontinuation of his urinary incontinence. She stated that she has not lost any of her sisters, nor has there been any loss of aunts on his father's side of the family, so he must have been agitated about his glasses. I also informed him that with his hypoglycemic episode, his metformin was discontinued and he was started on sliding scale insulin.   Diagnosis: Principal Problem:   Schizophrenia, chronic with acute exacerbation (HCC) Active Problems:   Obesity, Class III, BMI 40-49.9 (morbid obesity) (HCC)   Unsteady gait   Multiple sclerosis (HCC)   Schizophrenia, disorganized type (HCC)  Total Time Spent in Direct Patient Care: I personally  spent 35 minutes on the unit in direct patient care. The direct patient care time included face-to-face time with the patient, reviewing the patient's chart, communicating with other professionals, and coordinating care. Greater than 50% of this time was spent in counseling or coordinating care with the patient regarding goals of hospitalization, psycho-education, and discharge planning needs.   Past Psychiatric History: See H&P  Past Medical History:  Past Medical History:  Diagnosis Date   ADHD (attention deficit hyperactivity disorder)    Bipolar 1 disorder (HCC)    Chronic back pain    Chronic constipation    Chronic neck pain    Hypertension    Multiple sclerosis (HCC) 05/20/2013   left sided weakness, dysarthria   Non-compliance    Obesity    Pulmonary embolism (HCC)    Schizophrenia (HCC)    Stroke (HCC)    left sided deficits - pt's mother denies this    Past Surgical History:  Procedure Laterality Date   NO PAST SURGERIES     None     RADIOLOGY WITH ANESTHESIA N/A 01/16/2021   Procedure: MRI WITH ANESTHESIA CERVICAL AND THORASIC SPINE WITH AND WITHOUT CONTRAST;  Surgeon: Radiologist, Medication, MD;  Location: [facility] OR;  Service: Radiology;  Laterality: N/A;   TOOTH EXTRACTION N/A 06/24/2019   Procedure: DENTAL RESTORATION/EXTRACTION OF TEETH NUMBER ONE, SIXTEEN, SEVENTEEN, NINETEEN, THIRTY-TWO;  Surgeon: Ocie Doyne, DDS;  Location: [facility] OR;  Service: Oral Surgery;  Laterality: N/A;  Family History:  Family History  Problem Relation Age of Onset   Diabetes Mother    ADD / ADHD Brother    Family Psychiatric  History: See H&P Social History:  Social History   Substance and Sexual Activity  Alcohol Use Not Currently   Alcohol/week: 0.0 standard drinks   Comment: "A little bit"      Social History   Substance and Sexual Activity  Drug Use Not Currently   Types: Marijuana   Comment: Last used: unknown     Social History   Socioeconomic History   Marital  status: Single    Spouse name: Not on file   Number of children: 0   Years of education: 11th   Highest education level: Not on file  Occupational History   Occupation: unemployed    Employer: [employer]    Comment: Disbaled  Tobacco Use   Smoking status: Every Day    Packs/day: 0.25    Types: Cigarettes   Smokeless tobacco: Never   Tobacco comments:    2 cigarettes a day  Vaping Use   Vaping Use: Never used  Substance and Sexual Activity   Alcohol use: Not Currently    Alcohol/week: 0.0 standard drinks    Comment: "A little bit"    Drug use: Not Currently    Types: Marijuana    Comment: Last used: unknown    Sexual activity: Not on file  Other Topics Concern   Not on file  Social History Narrative   Patient lives at home with his mother.   Disabled.   Education 11 th grade .   Right handed.   Drinks caffeine occassionally   Social Determinants of Corporate investment banker Strain: Not on file  Food Insecurity: Not on file  Transportation Needs: Not on file  Physical Activity: Not on file  Stress: Not on file  Social Connections: Not on file    Sleep: Good  Appetite:  Good  Current Medications: Current Facility-Administered Medications  Medication Dose Route Frequency Provider Last Rate Last Admin   acetaminophen (TYLENOL) tablet 650 mg  650 mg Oral Q6H PRN Rankin, Shuvon B, NP   650 mg at 03/13/21 2234   alum & mag hydroxide-simeth (MAALOX/MYLANTA) 200-200-20 MG/5ML suspension 30 mL  30 mL Oral Q4H PRN Rankin, Shuvon B, NP   30 mL at 03/14/21 1651   amLODipine (NORVASC) tablet 5 mg  5 mg Oral Daily Nira Conn A, NP   5 mg at 03/23/21 0930   apixaban (ELIQUIS) tablet 5 mg  5 mg Oral BID Nira Conn A, NP   5 mg at 03/23/21 0929   ARIPiprazole (ABILIFY) tablet 10 mg  10 mg Oral Daily Doda, Traci Sermon, MD   10 mg at 03/23/21 0930   cloZAPine (CLOZARIL) tablet 150 mg  150 mg Oral QHS Karsten Ro, MD   150 mg at 03/22/21 2112   cloZAPine (CLOZARIL) tablet  50 mg  50 mg Oral q morning Cinderella, Margaret A   50 mg at 03/23/21 1024   docusate sodium (COLACE) capsule 400 mg  400 mg Oral QHS Nira Conn A, NP   400 mg at 03/22/21 2113   insulin aspart (novoLOG) injection 0-20 Units  0-20 Units Subcutaneous TID WC Lamar Sprinkles, MD       insulin aspart (novoLOG) injection 0-5 Units  0-5 Units Subcutaneous QHS Cosby, Courtney, MD       latanoprost (XALATAN) 0.005 % ophthalmic solution 1 drop  1 drop  Both Eyes QHS Nira Conn A, NP   1 drop at 03/22/21 2114   loratadine (CLARITIN) tablet 10 mg  10 mg Oral Daily Nira Conn A, NP   10 mg at 03/23/21 0930   LORazepam (ATIVAN) tablet 0.5 mg  0.5 mg Oral BID Karsten Ro, MD   0.5 mg at 03/23/21 0929   [START ON 03/25/2021] LORazepam (ATIVAN) tablet 0.5 mg  0.5 mg Oral Daily Karsten Ro, MD       OLANZapine zydis (ZYPREXA) disintegrating tablet 10 mg  10 mg Oral Q8H PRN Comer Locket, MD   10 mg at 03/23/21 1025   And   LORazepam (ATIVAN) tablet 1 mg  1 mg Oral PRN Comer Locket, MD       And   ziprasidone (GEODON) injection 20 mg  20 mg Intramuscular PRN Comer Locket, MD       magnesium hydroxide (MILK OF MAGNESIA) suspension 30 mL  30 mL Oral Daily PRN Rankin, Shuvon B, NP       metoprolol succinate (TOPROL-XL) 24 hr tablet 25 mg  25 mg Oral Daily Mariel Craft, MD   25 mg at 03/23/21 6237   multivitamin with minerals tablet 1 tablet  1 tablet Oral Daily Nira Conn A, NP   1 tablet at 03/23/21 0929   nicotine (NICODERM CQ - dosed in mg/24 hours) patch 14 mg  14 mg Transdermal Daily Nira Conn A, NP   14 mg at 03/23/21 0931   OLANZapine zydis (ZYPREXA) 10 MG disintegrating tablet            topiramate (TOPAMAX) tablet 25 mg  25 mg Oral BID Nira Conn A, NP   25 mg at 03/23/21 0930    Lab Results:  Results for orders placed or performed during the hospital encounter of 03/05/21 (from the past 48 hour(s))  Glucose, capillary     Status: Abnormal   Collection Time: 03/21/21  5:00  PM  Result Value Ref Range   Glucose-Capillary 188 (H) 70 - 99 mg/dL    Comment: Glucose reference range applies only to samples taken after fasting for at least 8 hours.  C-reactive protein     Status: Abnormal   Collection Time: 03/21/21  6:21 PM  Result Value Ref Range   CRP 2.6 (H) <1.0 mg/dL    Comment: Performed at [facility], [address] [place], [place] [address]  Troponin I (High Sensitivity)     Status: None   Collection Time: 03/21/21  6:21 PM  Result Value Ref Range   Troponin I (High Sensitivity) 3 <18 ng/L    Comment: (NOTE) Elevated high sensitivity troponin I (hsTnI) values and significant  changes across serial measurements may suggest ACS but many other  chronic and acute conditions are known to elevate hsTnI results.  Refer to the "Links" section for chest pain algorithms and additional  guidance. Performed at [facility], [address] [place], [place] [address]   Glucose, capillary     Status: Abnormal   Collection Time: 03/22/21  5:29 AM  Result Value Ref Range   Glucose-Capillary 126 (H) 70 - 99 mg/dL    Comment: Glucose reference range applies only to samples taken after fasting for at least 8 hours.  Glucose, capillary     Status: Abnormal   Collection Time: 03/22/21 12:16 PM  Result Value Ref Range   Glucose-Capillary 103 (H) 70 - 99 mg/dL    Comment: Glucose reference  range applies only to samples taken after fasting for at least 8 hours.  Glucose, capillary     Status: Abnormal   Collection Time: 03/22/21  5:13 PM  Result Value Ref Range   Glucose-Capillary 49 (L) 70 - 99 mg/dL    Comment: Glucose reference range applies only to samples taken after fasting for at least 8 hours.  Glucose, capillary     Status: Abnormal   Collection Time: 03/22/21  5:30 PM  Result Value Ref Range   Glucose-Capillary 117 (H) 70 - 99 mg/dL    Comment: Glucose reference range applies only to samples taken after fasting for  at least 8 hours.  Glucose, capillary     Status: Abnormal   Collection Time: 03/22/21  6:00 PM  Result Value Ref Range   Glucose-Capillary 145 (H) 70 - 99 mg/dL    Comment: Glucose reference range applies only to samples taken after fasting for at least 8 hours.  Glucose, capillary     Status: Abnormal   Collection Time: 03/22/21  9:45 PM  Result Value Ref Range   Glucose-Capillary 140 (H) 70 - 99 mg/dL    Comment: Glucose reference range applies only to samples taken after fasting for at least 8 hours.  Glucose, capillary     Status: Abnormal   Collection Time: 03/23/21  6:11 AM  Result Value Ref Range   Glucose-Capillary 124 (H) 70 - 99 mg/dL    Comment: Glucose reference range applies only to samples taken after fasting for at least 8 hours.    Blood Alcohol level:  Lab Results  Component Value Date   ETH <10 03/04/2021   ETH <10 09/13/2020    Metabolic Disorder Labs: Lab Results  Component Value Date   HGBA1C 5.5 03/07/2021   MPG 111.15 03/07/2021   MPG 123 04/30/2020   Lab Results  Component Value Date   PROLACTIN 13.6 04/30/2020   Lab Results  Component Value Date   CHOL 151 03/07/2021   TRIG 154 (H) 03/07/2021   HDL 32 (L) 03/07/2021   CHOLHDL 4.7 03/07/2021   VLDL 31 03/07/2021   LDLCALC 88 03/07/2021   LDLCALC 77 04/30/2020    Physical Findings:  Musculoskeletal: Strength & Muscle Tone: within normal limits Gait & Station: Not tested, sitting in chair in day room. Patient leans: N/A  Psychiatric Specialty Exam:  Presentation  General Appearance: In hospital gown; fair hygiene Eye Contact:Fair Speech:Slow dysarthric, mumbling Speech Volume:Normal Handedness:Right   Mood and Affect  Mood:" Good" Affect:constricted but calm and cooperative   Thought Process  Thought Processes: superficially goal directed, concrete Descriptions of Associations:Intact  Orientation:Full (Time, Place and Person)   Thought Content: Endorses delusion of aunt  being dead. Denies AVH and denies paranoia - is not grossly responding to internal/external stimuli on exam  History of Schizophrenia/Schizoaffective disorder:Yes  Duration of Psychotic Symptoms:Greater than six months  Hallucinations:Hallucinations: Denies  Suicidal Thoughts:Suicidal Thoughts: No  Homicidal Thoughts:Homicidal Thoughts: No   Sensorium  Memory:Fair  Judgment:Fair - compliant with meds  Insight:lacking   Executive Functions  Concentration:Good  Attention Span:Good  Recall:Untested today  Fund of Knowledge:Fair  Language:Fair   Psychomotor Activity  Psychomotor Activity: Decreased today - resting in bed   Assets  Assets:Communication Skills; Desire for Improvement; Social Support   Sleep  Sleep:5 hours  Physical Exam: Physical Exam Constitutional:      Appearance: He is obese.  HENT:     Head: Normocephalic and atraumatic.  Pulmonary:     Effort:  Pulmonary effort is normal.  Neurological:     General: No focal deficit present.     Mental Status: He is alert and oriented to person, place, and time.   Review of Systems  Constitutional:  Negative for fever.  HENT:  Negative for sore throat.   Respiratory:  Negative for cough.   Cardiovascular:  Negative for chest pain.  Gastrointestinal:  Negative for constipation.  Genitourinary:  Negative for dysuria and urgency.       Denies urinary incontinence  Psychiatric/Behavioral:  Negative for hallucinations and suicidal ideas. The patient is not nervous/anxious.   Blood pressure 137/87, pulse 95, temperature 98.5 F (36.9 C), temperature source Oral, resp. rate 18, height 5\' 11"  (1.803 m), weight (!) 145.2 kg, SpO2 100 %. Body mass index is 44.63 kg/m.   Treatment Plan Summary: Daily contact with patient to assess and evaluate symptoms and progress in treatment and Medication management  Mr. Wojtaszek is a 30 yr old male who presents under IVC for acute psychosis in setting of treatment  resistance (mother verifies pill swallows daily). Had been on 2 antipsychotics (last haldol LAI dispensed early August, saphris 10 TID). In review of chart, has typically not responded well to most antipsychotics with documented compliance. He was previously on clozapine 2-3 years ago with good response, stopped d/t drooling per pt and mother. We discussed restarting this with his mother and pt who provided oral consent/assent to restart clozapine. Patient has discontinued saphris given it has been ineffective and he has a vulnerable brain d/t MS and prior stroke. Patient has been clinically improving with dose titration up on Clozaril but has developed urinary incontinence, so there was concern for Clozaril being the culprit. There are some studies showing the use of Abilify, desmopressin, and ephedrine for urinary incontinence in patients with clozapine. After discontinuing Saphris and starting Abilify, he is no longer incontinent. It is likely that the incontinence was 2/2 Clozaril or to being overly sedated by the Saphris and not feeling the urge to urinate.   Schizophrenia, Chronic with acute exacerbation.  - Continue Abilify 10 mg daily to help with psychosis and urinary incontinence. - Continue Clozaril 150 mg QHS and continue Clozaril 50 mg in morning- will reassess for continued upward titration as needed - Monitoring weekly CBC, CRP and Troponin with Clozaril titration  Troponin 3 (9/15), 7 (9/8), 4 (9/3)  CRP 2.6 (9/15), 1.9 (9/3), 6.9 (07/20/20)  CBC/ANC 8.09/5198 (9/14), 8.02/5599 (9/8) 7.08/4898 (9/2)  Clozaril level 141 (9/12) - Last EKG  03/16/2021 is NSR, QTC interval 420 - will recheck EKG tomorrow for QTC monitoring  New onset urinary incontinence Nursing order - Behavior modification to avoid urinary incontinence.  -Voiding before bedtime.  -Remind him to void at least 3-4 times in a day.  -Avoid fluids before sleep -Continue Abilify 10 mg daily.  Mom gave consent to start  Abilify. -Plan to start desmopressin 0.1 mg nightly if Abilify does not help with urinary incontinence.  Catatonia  -Reduce Ativan to 0.5 mg twice daily for 3 days.  Then 0.5 mg daily for 3 days and then stop.  Patient drowsiness may be related to Ativan.  - Topamax (incidentally on for wt loss) also tx catatonia, clozapine has data in catatonia as well.    Nicotine Dependence: -Continue Nicotine Patch 14 mg daily   HTN: -Continue Amlodipine 5 mg daily. - Continue Toprol XL 25mg  daily   H/o PE -Continue Eliquis 5 mg BID   Chronic Constipation -Continue Colace 400  mg QHS   Obesity: -Continue Topamax 25 mg BID as written by PCP  Hypoglycemia - Hold metformin and monitor FSBS with SSI coverage PRN - Encourage po intake   MS/gait disorder: - Reportedly getting Ocrevus by neurology was due on 9/8. - Infusion for MS got cancelled due to inpatient /outpatient reimbursement issues. Mom informed and ok for him to get it after discharge.  - Home Health OT at D/c.   Other home meds: -Continue Xalatan 0.005% 1 drop both eyes QHS -Continue Claritin 10 mg daily   -DO NOT START TRAZODONE OR GABAPENTIN AS IT WAS STOPPED BY NEUROLOGY DUE TO GAIT INSTABILITY.   -Continue PRN's: Tylenol, Maalox, Atarax, Milk of Magnesia  Lamar Sprinkles, MD  03/23/2021, 3:48 PM

## 2021-03-23 NOTE — Progress Notes (Signed)
Adult Psychoeducational Group Note  Date:  03/23/2021 Time:  11:23 PM  Group Topic/Focus:  Wrap-Up Group:   The focus of this group is to help patients review their daily goal of treatment and discuss progress on daily workbooks.  Participation Level:  Active  Participation Quality:  Inattentive  Affect:  Anxious and Excited  Cognitive:  Disorganized and Delusional  Insight: Limited  Engagement in Group:  Engaged  Modes of Intervention:  Discussion  Additional Comments: This Clinical research associate facilitated a Firefighter- Up group to support the patient with increasing awareness of their emotions and discuss their goals, progress, and objectives and how group skills were demonstrated on this date. Review and discuss patient's goals providing guidelines for improving their ability to engage in self-reflection. Patient displayed poor organization of thought, with frequent errors in reasoning and judgment in this group setting but improving. Pt self-reporting has an overall day of 8 out of 10 on this date. End of Wrap-Up Group progress note.   Nicoletta Dress 03/23/2021, 11:23 PM

## 2021-03-23 NOTE — Progress Notes (Signed)
Pt up responding to internal stimuli, pt loud at the voices . Pt getting agitated needing redirection, pt given PRN Zyprexa per [facility] after HS medication

## 2021-03-23 NOTE — Plan of Care (Signed)
  Problem: Education: Goal: Knowledge of [facility] Education information/materials will improve Outcome: Progressing Goal: Emotional status will improve Outcome: Progressing Goal: Mental status will improve Outcome: Progressing Goal: Verbalization of understanding the information provided will improve Outcome: Progressing   

## 2021-03-23 NOTE — Group Note (Signed)
LCSW Group Therapy Note  03/23/2021  10-11am  Type of Therapy and Topic:  Group Therapy: "My Mental Health"  Participation Level:  Did Not Attend   Description of Group:   In this group, patients were asked four questions in order to generate discussion around the idea of mental illness and/or substance addiction being medical problems: In one sentence describe the current state of your mental health or substance use. How much do you feel similar to or different from others? Do you tend to identify with other people or compare yourself to them?  In a word or sentence, share what you desire your mental health or substance use to be moving forward.  Discussion was held that led to the conclusion that comparing ourselves to others is not healthy, but identifying with the elements of their issues that are similar to ours is helpful.    Therapeutic Goals:  Patients will identify their feelings about their current mental health/substance use problems. Patients will describe how they feel similar to or different from others, and whether they tend to identify with or compare themselves to other people with the same issues. Patients will explore the differences in these concepts and how a change of mindset about mental health/substance use can help with reaching recovery goals. Patients will think about and share what their recovery goals are, in terms of mental health and/or substance use.  Summary of Patient Progress:  The patient was invited to group, did not attend  Therapeutic Modalities:   Processing Psychoeducation  Varshini Arrants J Grossman-Orr, MSW, LCSW 

## 2021-03-23 NOTE — Progress Notes (Signed)
   03/23/21 1855  Psych Admission Type (Psych Patients Only)  Admission Status Involuntary  Psychosocial Assessment  Patient Complaints Anxiety  Eye Contact Fair  Facial Expression Anxious  Affect Preoccupied  Speech Soft  Interaction Minimal  Motor Activity Slow  Appearance/Hygiene Improved  Behavior Characteristics Appropriate to situation  Mood Preoccupied  Thought Process  Coherency Circumstantial;Loose associations  Content Preoccupation  Delusions Paranoid  Perception Hallucinations  Hallucination Auditory  Judgment Poor  Confusion None  Danger to Self  Current suicidal ideation? Denies  Danger to Others  Danger to Others None reported or observed   D: Patient is alert and oriented x 4. Patient denies SI/HI/ AVH and pain. Disposition is Calm and labile at times. Pt required PRN medication for severe agitation. Verbally contracts for safety to this Clinical research associate.   A:  Pt was given scheduled and PRN medications. Pt was encourage to attend groups. Q 15 minute checks were done for safety. Pt was visualized interacting appropriately with others in the milieu. Pt was offered support and encouragement by this Clinical research associate. Pt is goal oriented and stated goal was reached for the day. Pt complied with scheduled medications. No signs of distress nor concerns reported by patient at present.Pt remains receptive to treatment and safety maintained on unit.    R: Will continue to monitor and assess. Safety maintained during this shift.

## 2021-03-24 DIAGNOSIS — F201 Disorganized schizophrenia: Secondary | ICD-10-CM | POA: Diagnosis not present

## 2021-03-24 LAB — GLUCOSE, CAPILLARY
Glucose-Capillary: 134 mg/dL — ABNORMAL HIGH (ref 70–99)
Glucose-Capillary: 133 mg/dL — ABNORMAL HIGH (ref 70–99)
Glucose-Capillary: 130 mg/dL — ABNORMAL HIGH (ref 70–99)

## 2021-03-24 MED ORDER — CLOZAPINE 25 MG PO TABS
75.0000 mg | ORAL_TABLET | Freq: Every morning | ORAL | Status: DC
  Administered 2021-03-24 – 2021-03-27 (×4): 75 mg via ORAL
  Filled 2021-03-24 (×8): qty 3

## 2021-03-24 NOTE — [facility] Group Notes (Signed)
[facility] Group Notes:  (Nursing/MHT/Case Management/Adjunct)  Date:  03/24/2021  Time:  8:39 PM  Type of Therapy:  Group Therapy  Participation Level:  Active  Participation Quality:  Appropriate and Attentive  Affect:  Appropriate  Cognitive:  Appropriate and Oriented  Insight:  Improving  Engagement in Group:  Developing/Improving  Modes of Intervention:  Discussion  Summary of Progress/Problems:  Brandon [name] 03/24/2021, 8:39 PM

## 2021-03-24 NOTE — Progress Notes (Signed)
  D: Patient is alert and oriented x 4. Patient denies SI/HI/ AVH and pain. Disposition is labile but  cooperative Pt remains preoccupied and responding to internal stimuli. Pt has flight of ideas and talk off subject multiple times throughout the day. Verbally contracts for safety to this Clinical research associate.   A:  Pt was given scheduled medications. Pt was encourage to attend groups. Q 15 minute checks were done for safety. Pt was visualized interacting appropriately with others in the milieu. Pt Pt was offered support and encouragement by this Clinical research associate.  Pt attended groups and interacts well with peers and staff.  No signs of distress nor concerns reported by patient at present. Pt remains receptive to treatment and safety maintained on unit.   R: Will continue to monitor and assess. Maintain fall precautions. Safety maintained during this shift.      03/24/21 1753  Psych Admission Type (Psych Patients Only)  Admission Status Involuntary  Psychosocial Assessment  Patient Complaints Anxiety  Eye Contact Fair  Facial Expression Anxious  Affect Preoccupied  Speech Soft  Interaction Minimal  Motor Activity Slow  Appearance/Hygiene Improved  Behavior Characteristics Anxious  Mood Preoccupied;Anxious;Depressed  Thought Process  Coherency Circumstantial;Loose associations  Content Preoccupation  Delusions Paranoid  Perception Hallucinations  Hallucination Auditory  Judgment Poor  Confusion None  Danger to Self  Current suicidal ideation? Denies  Danger to Others  Danger to Others None reported or observed

## 2021-03-24 NOTE — [facility] Group Notes (Signed)
[facility] Group Notes:  (Nursing/MHT/Case Management/Adjunct)  Date:  03/24/2021  Time:  5:21 PM  Type of Therapy:  Group Therapy  Participation Level:  Minimal  Participation Quality:  Appropriate  Affect:  Not Congruent  Cognitive:  Disorganized  Insight:  Improving and Lacking  Engagement in Group:  Developing/Improving  Modes of Intervention:  Education and Support  Summary of Progress/Problems: Topic is support systems. His support system is his family.   Roshon Duell J Latacha Texeira 03/24/2021, 5:21 PM

## 2021-03-24 NOTE — Group Note (Signed)
[facility] LCSW Group Therapy Note  Date/Time:  03/24/2021 11:00am-12:00pm  Type of Therapy and Topic:  Group Therapy:  Healthy and Unhealthy Supports  Participation Level:  Active   Description of Group: Patients in this group were invited to identify the differences between healthy and unhealthy supports and then to identify those people in their lives who fall into one category or the other, as well as why.  They were then introduced to the idea of adding more healthy supports and decreasing the unhealthy ones.  Patients discussed what additional healthy supports could be helpful in their recovery and wellness after discharge in order to prevent future hospitalizations.   An emphasis was placed on using counselor, doctor, therapy groups, 12-step groups, and problem-specific support groups to expand supports.    Therapeutic Goals:   1)  discuss importance of adding supports to stay well once out of the hospital  2)  compare healthy versus unhealthy supports and identify some examples of each  3)  generate ideas and descriptions of healthy supports that can be added  4)  offer mutual support about how to address unhealthy supports  5)  encourage active participation in and adherence to discharge plan    Summary of Patient Progress:  The patient stated that current healthy supports in his life are his grandmother, mother, cousins, uncles, aunts, and friends while current unhealthy supports include none, because he put up boundaries with negative people "a long time ago."  The patient expressed a willingness to add more peer support as support(s) to help in his recovery journey.  When asked direct questions, he did very well, but in between those times he did respond to internal stimuli almost continually.   Therapeutic Modalities:   Motivational Interviewing Brief Solution-Focused Therapy  Ambrose Mantle, LCSW

## 2021-03-24 NOTE — Progress Notes (Addendum)
[facility] MD Progress Note  03/24/2021 7:16 AM Brandon [name]  MRN:  [id number]  Chief Complaint: psychosis  Reason for Admission: Brandon [name] is a 30 y.o. male with medical history significant for MS (followed by Neurology on Ocrevus), HTN, h/o PE on chronic anticoagulation, and schizophrenia who presented to [facility] on 03/04/21 for worsening AH.  The patient is currently on Hospital Day 19.   Chart Review from last 24 hours:  The patient's chart was reviewed and nursing notes were reviewed. The patient's case was discussed in multidisciplinary team meeting. Per nursing, patient had a behavioral outburst yesterday and required PRN Zyprexa po for acute agitation. He was later noted to be calm an able to interact appropriately with peers and to attend groups. On nightshift, he was seen responding to internal stimuli and was again agitated requiring PRN Zyprexa po. Per [facility] he was compliant with scheduled medications and confirmed with pharmacy/nursing that he only required Zyprexa PRN X2 doses yesterday.  Information Obtained Today During Patient Interview: The patient was seen and evaluated on the unit. On assessment today the patient reports that he got upset yesterday "when I heard something bad happened in my family." I advised that Dr. Alfonse Flavors talked to his mother who confirmed that his aunt is alive, and he responded with sense of relief to hear this news. He denies having behavioral issues today and was encouraged to take a bath and attend to his ADLs. He denies any urinary incontinence overnight or during the day and voices no physical complaints. He states he is sleeping and eating well and denies issues with constipation or drooling. He denies AVH, paranoia, ideas of reference, first rank symptoms or magical thinking. He denies SI or HI. We discussed plans to titrate up on his Clozaril and to hold his Ativan taper given his agitation yesterday.  Diagnosis: Principal Problem:   Schizophrenia,  chronic with acute exacerbation (HCC) Active Problems:   Obesity, Class III, BMI 40-49.9 (morbid obesity) (HCC)   Unsteady gait   Multiple sclerosis (HCC)   Schizophrenia, disorganized type (HCC)  Total Time Spent in Direct Patient Care: I personally spent 25 minutes on the unit in direct patient care. The direct patient care time included face-to-face time with the patient, reviewing the patient's chart, communicating with other professionals, and coordinating care. Greater than 50% of this time was spent in counseling or coordinating care with the patient regarding goals of hospitalization, psycho-education, and discharge planning needs.   Past Psychiatric History: See H&P  Past Medical History:  Past Medical History:  Diagnosis Date   ADHD (attention deficit hyperactivity disorder)    Bipolar 1 disorder (HCC)    Chronic back pain    Chronic constipation    Chronic neck pain    Hypertension    Multiple sclerosis (HCC) 05/20/2013   left sided weakness, dysarthria   Non-compliance    Obesity    Pulmonary embolism (HCC)    Schizophrenia (HCC)    Stroke (HCC)    left sided deficits - pt's mother denies this    Past Surgical History:  Procedure Laterality Date   NO PAST SURGERIES     None     RADIOLOGY WITH ANESTHESIA N/A 01/16/2021   Procedure: MRI WITH ANESTHESIA CERVICAL AND THORASIC SPINE WITH AND WITHOUT CONTRAST;  Surgeon: Radiologist, Medication, MD;  Location: [facility] OR;  Service: Radiology;  Laterality: N/A;   TOOTH EXTRACTION N/A 06/24/2019   Procedure: DENTAL RESTORATION/EXTRACTION OF TEETH NUMBER ONE, SIXTEEN, SEVENTEEN, NINETEEN, THIRTY-TWO;  Surgeon: Ocie Doyne, DDS;  Location: [facility] OR;  Service: Oral Surgery;  Laterality: N/A;   Family History:  Family History  Problem Relation Age of Onset   Diabetes Mother    ADD / ADHD Brother    Family Psychiatric  History: See H&P  Social History:  Social History   Substance and Sexual Activity  Alcohol Use Not Currently    Alcohol/week: 0.0 standard drinks   Comment: "A little bit"      Social History   Substance and Sexual Activity  Drug Use Not Currently   Types: Marijuana   Comment: Last used: unknown     Social History   Socioeconomic History   Marital status: Single    Spouse name: Not on file   Number of children: 0   Years of education: 11th   Highest education level: Not on file  Occupational History   Occupation: unemployed    Employer: [employer]    Comment: Disbaled  Tobacco Use   Smoking status: Every Day    Packs/day: 0.25    Types: Cigarettes   Smokeless tobacco: Never   Tobacco comments:    2 cigarettes a day  Vaping Use   Vaping Use: Never used  Substance and Sexual Activity   Alcohol use: Not Currently    Alcohol/week: 0.0 standard drinks    Comment: "A little bit"    Drug use: Not Currently    Types: Marijuana    Comment: Last used: unknown    Sexual activity: Not on file  Other Topics Concern   Not on file  Social History Narrative   Patient lives at home with his mother.   Disabled.   Education 11 th grade .   Right handed.   Drinks caffeine occassionally   Social Determinants of Corporate investment banker Strain: Not on file  Food Insecurity: Not on file  Transportation Needs: Not on file  Physical Activity: Not on file  Stress: Not on file  Social Connections: Not on file    Sleep: Good  Appetite:  Good  Current Medications: Current Facility-Administered Medications  Medication Dose Route Frequency Provider Last Rate Last Admin   acetaminophen (TYLENOL) tablet 650 mg  650 mg Oral Q6H PRN Rankin, Shuvon B, NP   650 mg at 03/13/21 2234   alum & mag hydroxide-simeth (MAALOX/MYLANTA) 200-200-20 MG/5ML suspension 30 mL  30 mL Oral Q4H PRN Rankin, Shuvon B, NP   30 mL at 03/14/21 1651   amLODipine (NORVASC) tablet 5 mg  5 mg Oral Daily Nira Conn A, NP   5 mg at 03/23/21 0930   apixaban (ELIQUIS) tablet 5 mg  5 mg Oral BID Nira Conn A, NP    5 mg at 03/23/21 1706   ARIPiprazole (ABILIFY) tablet 10 mg  10 mg Oral Daily Doda, Vandana, MD   10 mg at 03/23/21 0930   cloZAPine (CLOZARIL) tablet 150 mg  150 mg Oral QHS Karsten Ro, MD   150 mg at 03/23/21 2114   cloZAPine (CLOZARIL) tablet 50 mg  50 mg Oral q morning Cinderella, Margaret A   50 mg at 03/23/21 1024   docusate sodium (COLACE) capsule 400 mg  400 mg Oral QHS Nira Conn A, NP   400 mg at 03/23/21 2114   insulin aspart (novoLOG) injection 0-9 Units  0-9 Units Subcutaneous TID WC Comer Locket, MD   1 Units at 03/24/21 0633   latanoprost (XALATAN) 0.005 % ophthalmic solution 1 drop  1 drop Both Eyes QHS Nira Conn A, NP   1 drop at 03/23/21 2116   loratadine (CLARITIN) tablet 10 mg  10 mg Oral Daily Nira Conn A, NP   10 mg at 03/23/21 0930   LORazepam (ATIVAN) tablet 0.5 mg  0.5 mg Oral BID Karsten Ro, MD   0.5 mg at 03/23/21 1706   [START ON 03/25/2021] LORazepam (ATIVAN) tablet 0.5 mg  0.5 mg Oral Daily Doda, Traci Sermon, MD       OLANZapine zydis (ZYPREXA) disintegrating tablet 10 mg  10 mg Oral Q8H PRN Comer Locket, MD   10 mg at 03/23/21 2211   And   LORazepam (ATIVAN) tablet 1 mg  1 mg Oral PRN Comer Locket, MD       And   ziprasidone (GEODON) injection 20 mg  20 mg Intramuscular PRN Comer Locket, MD       magnesium hydroxide (MILK OF MAGNESIA) suspension 30 mL  30 mL Oral Daily PRN Rankin, Shuvon B, NP       metoprolol succinate (TOPROL-XL) 24 hr tablet 25 mg  25 mg Oral Daily Mariel Craft, MD   25 mg at 03/23/21 4854   multivitamin with minerals tablet 1 tablet  1 tablet Oral Daily Nira Conn A, NP   1 tablet at 03/23/21 6270   nicotine (NICODERM CQ - dosed in mg/24 hours) patch 14 mg  14 mg Transdermal Daily Nira Conn A, NP   14 mg at 03/23/21 0931   topiramate (TOPAMAX) tablet 25 mg  25 mg Oral BID Nira Conn A, NP   25 mg at 03/23/21 1706    Lab Results:  Results for orders placed or performed during the hospital encounter of  03/05/21 (from the past 48 hour(s))  Glucose, capillary     Status: Abnormal   Collection Time: 03/22/21 12:16 PM  Result Value Ref Range   Glucose-Capillary 103 (H) 70 - 99 mg/dL    Comment: Glucose reference range applies only to samples taken after fasting for at least 8 hours.  Glucose, capillary     Status: Abnormal   Collection Time: 03/22/21  5:13 PM  Result Value Ref Range   Glucose-Capillary 49 (L) 70 - 99 mg/dL    Comment: Glucose reference range applies only to samples taken after fasting for at least 8 hours.  Glucose, capillary     Status: Abnormal   Collection Time: 03/22/21  5:30 PM  Result Value Ref Range   Glucose-Capillary 117 (H) 70 - 99 mg/dL    Comment: Glucose reference range applies only to samples taken after fasting for at least 8 hours.  Glucose, capillary     Status: Abnormal   Collection Time: 03/22/21  6:00 PM  Result Value Ref Range   Glucose-Capillary 145 (H) 70 - 99 mg/dL    Comment: Glucose reference range applies only to samples taken after fasting for at least 8 hours.  Glucose, capillary     Status: Abnormal   Collection Time: 03/22/21  9:45 PM  Result Value Ref Range   Glucose-Capillary 140 (H) 70 - 99 mg/dL    Comment: Glucose reference range applies only to samples taken after fasting for at least 8 hours.  Glucose, capillary     Status: Abnormal   Collection Time: 03/23/21  6:11 AM  Result Value Ref Range   Glucose-Capillary 124 (H) 70 - 99 mg/dL    Comment: Glucose reference range applies only to samples taken after fasting for  at least 8 hours.  Glucose, capillary     Status: Abnormal   Collection Time: 03/23/21  5:09 PM  Result Value Ref Range   Glucose-Capillary 134 (H) 70 - 99 mg/dL    Comment: Glucose reference range applies only to samples taken after fasting for at least 8 hours.  Glucose, capillary     Status: Abnormal   Collection Time: 03/23/21  7:51 PM  Result Value Ref Range   Glucose-Capillary 115 (H) 70 - 99 mg/dL     Comment: Glucose reference range applies only to samples taken after fasting for at least 8 hours.  Glucose, capillary     Status: Abnormal   Collection Time: 03/24/21  5:33 AM  Result Value Ref Range   Glucose-Capillary 134 (H) 70 - 99 mg/dL    Comment: Glucose reference range applies only to samples taken after fasting for at least 8 hours.   Comment 1 Notify RN     Blood Alcohol level:  Lab Results  Component Value Date   ETH <10 03/04/2021   ETH <10 09/13/2020    Metabolic Disorder Labs: Lab Results  Component Value Date   HGBA1C 5.5 03/07/2021   MPG 111.15 03/07/2021   MPG 123 04/30/2020   Lab Results  Component Value Date   PROLACTIN 13.6 04/30/2020   Lab Results  Component Value Date   CHOL 151 03/07/2021   TRIG 154 (H) 03/07/2021   HDL 32 (L) 03/07/2021   CHOLHDL 4.7 03/07/2021   VLDL 31 03/07/2021   LDLCALC 88 03/07/2021   LDLCALC 77 04/30/2020    Physical Findings:  Musculoskeletal: Strength & Muscle Tone:untested Gait & Station: wide-based/unsteady- ambulates independently Patient leans: N/A  Psychiatric Specialty Exam:  Presentation  General Appearance: In hospital gown; malodorous and unkempt appearing Eye Contact:Fair Speech:Dysarthric, mumbling quality  Speech Volume:Normal Handedness:Right   Mood and Affect  Mood: appears aloof and suspicious Affect:constricted    Thought Process  Thought Processes: concrete, superficially goal directed and linear Descriptions of Associations:Intact  Orientation: oriented to self, month, date, year and city  Thought Content: Denies AVH and denies paranoia  but appears guarded around peers and with examiner today - frequently staring at peers as he passes them in the hall; denies ideas of reference, first rank symptoms and does not make delusional statements today  History of Schizophrenia/Schizoaffective disorder:Yes  Duration of Psychotic Symptoms:Greater than six  months  Hallucinations:Hallucinations: Denies  Suicidal Thoughts:Suicidal Thoughts: No  Homicidal Thoughts:Homicidal Thoughts: No   Sensorium  Memory:Fair  Judgment:Fair - compliant with meds  Insight:lacking   Executive Functions  Concentration:Fair  Attention Span:Fair  Recall:Untested today  Fund of Knowledge:Fair  Language:Fair   Psychomotor Activity  Psychomotor Activity: Normal - no acute agitation or restlessness no akathisias   Assets  Assets:Communication Skills; Desire for Improvement; Social Support   Sleep  Sleep: 5.5 hours  Physical Exam: Physical Exam Vitals and nursing note reviewed.  Constitutional:      Appearance: He is obese.  HENT:     Head: Normocephalic and atraumatic.  Pulmonary:     Effort: Pulmonary effort is normal.  Neurological:     Mental Status: He is alert and oriented to person, place, and time.   Review of Systems  Respiratory:  Negative for cough.   Cardiovascular:  Negative for chest pain.  Gastrointestinal:  Negative for constipation, diarrhea, nausea and vomiting.  Genitourinary:  Negative for dysuria and urgency.       Denies urinary incontinence  Blood pressure 137/87,  pulse 95, temperature 98.5 F (36.9 C), temperature source Oral, resp. rate 18, height 5\' 11"  (1.803 m), weight (!) 145.2 kg, SpO2 100 %. Body mass index is 44.63 kg/m.   Treatment Plan Summary: Daily contact with patient to assess and evaluate symptoms and progress in treatment and Medication management  Brandon [name] is a 30 yr old male who presents under IVC for acute psychosis in setting of treatment resistance (mother verifies pill swallows daily). Had been on 2 antipsychotics (last haldol LAI dispensed early August, saphris 10 TID). In review of chart, has typically not responded well to most antipsychotics with documented compliance. He was previously on clozapine 2-3 years ago with good response, stopped d/t drooling per pt and mother. We  discussed restarting this with his mother and pt who provided oral consent/assent to restart clozapine. Patient has discontinued saphris given it has been ineffective and he has a vulnerable brain d/t MS and prior stroke. Patient has been clinically improving with dose titration up on Clozaril but has developed urinary incontinence, so there was concern for Clozaril being the culprit. There are some studies showing the use of Abilify, desmopressin, and ephedrine for urinary incontinence in patients with clozapine. After discontinuing Saphris and starting Abilify, he is no longer incontinent. It is likely that the incontinence was 2/2 Clozaril or to being overly sedated by the Saphris and not feeling the urge to urinate. He is having some breakthrough agitation with taper off Ativan and discontinuation of Saphris. Will continue upward titration of Clozaril and monitor.   Schizophrenia, Chronic with acute exacerbation.  - Continue Abilify 10 mg daily to help with psychosis and urinary incontinence. - Increase Clozaril to 150 mg QHS and 75 mg in morning given recent agitation and response to internal stimuli  - Will hold Ativan at 0.5mg  bid given recent agitation and attempt tiration off this med slowly over time - no longer having signs of catatonia - Monitoring weekly CBC, CRP and Troponin with Clozaril titration  Troponin 3 (9/15), 7 (9/8), 4 (9/3)  CRP 2.6 (9/15), 1.9 (9/3), 6.9 (07/20/20)  CBC/ANC 8.09/5198 (9/14), 8.02/5599 (9/8) 7.08/4898 (9/2)  Clozaril level 141 (9/12)  Checking orthostatic vitals and monitoring for drooling or constipation - Last EKG  03/16/2021 is NSR, QTC interval 420 - repeat EKG today pending - Checking orthostatic vitals with dose titration on Clozaril  New onset urinary incontinence - improving Nursing order - Behavior modification to avoid urinary incontinence.  -Voiding before bedtime.  -Remind him to void at least 3-4 times in a day.  -Avoid fluids before  sleep -Continue Abilify 10 mg daily.  Mom gave consent to start Abilify. - UA ordered and pending  Catatonia - resolved -Reduced Ativan to 0.5 mg bid and will hold at that dose due to recent agitation - Topamax (incidentally on for wt loss) also tx catatonia, clozapine has data in catatonia as well.    Nicotine Dependence: -Continue Nicotine Patch 14 mg daily   HTN: -Continue Amlodipine 5 mg daily. - Continue Toprol XL 25mg  daily   H/o PE -Continue Eliquis 5 mg BID   Chronic Constipation -Continue Colace 400 mg QHS   Obesity: -Continue Topamax 25 mg BID as written by PCP  Hypoglycemia - Held Metformin and will monitor FSBS prior to meals with SSI coverage - Recent FSBS 134-115   MS/gait disorder: - Reportedly getting Ocrevus by neurology was due on 9/8. - Infusion for MS got cancelled due to inpatient /outpatient reimbursement issues. Mom informed and ok  for him to get it after discharge.  - Home Health OT at D/c.   Other home meds: -Continue Xalatan 0.005% 1 drop both eyes QHS -Continue Claritin 10 mg daily   -DO NOT START TRAZODONE OR GABAPENTIN AS IT WAS STOPPED BY NEUROLOGY DUE TO GAIT INSTABILITY.   -Continue PRN's: Tylenol, Maalox, Atarax, Milk of Magnesia  Comer Locket, MD , FAPA 03/24/2021, 7:16 AM

## 2021-03-24 NOTE — Progress Notes (Signed)
Pt said that he has been avoiding his "bad thoughts and vibes" by talking to his peers, which has helped. He shared that prior to admission he was hearing voices that had started to bother him. Pt said that he heard them outside and inside of his head. He also expressed some paranoia and said that there was some man that he would shoot if he hurt his family. Pt can get loud at times while responding to internal stimuli. During medication administration he was talking to himself and laughing loudly. He responded well to verbal redirection. He spent some time in the dayroom tonight as well. Pt was moved to [address] given his hx of agitation and paranoia. Pt responded well to the room change. Pt said that he did urinate before changing rooms. Informed pt to let staff know the next time he urinates so we can also collect his urine specimen. Pt denies SI/HI. Active listening, reassurance, and support provided. Medications administered as ordered by provider. Q 15 min safety checks continue. Pt's safety has been maintained.   03/24/21 2106  Psych Admission Type (Psych Patients Only)  Admission Status Involuntary  Psychosocial Assessment  Patient Complaints Anxiety;Restlessness;Suspiciousness  Eye Contact Fair  Facial Expression Anxious  Affect Anxious;Preoccupied  Corporate treasurer;Restless  Appearance/Hygiene Improved  Behavior Characteristics Cooperative;Anxious;Fidgety  Mood Anxious;Preoccupied  Thought Process  Coherency Circumstantial;Loose associations  Content Blaming others;Paranoia;Preoccupation  Delusions Paranoid  Perception Hallucinations  Hallucination Auditory  Judgment Poor  Confusion None  Danger to Self  Current suicidal ideation? Denies  Danger to Others  Danger to Others None reported or observed

## 2021-03-24 NOTE — [facility] Group Notes (Signed)
[facility] Group Notes:  (Nursing/MHT/Case Management/Adjunct)  Date:  03/24/2021  Time:  9:42 AM  Type of Therapy:  Group Therapy  Participation Level:  Active  Participation Quality:  Appropriate  Affect:  Appropriate  Cognitive:  Appropriate  Insight:  Improving  Engagement in Group:  Engaged  Modes of Intervention:  Orientation  Summary of Progress/Problems: Pt goal for today is to keep the bad vibes and voices to a minimum and block bad thoughts.   Brandon [name] J Brandon [name] 03/24/2021, 9:42 AM

## 2021-03-25 ENCOUNTER — Encounter (HOSPITAL_COMMUNITY): Payer: Self-pay

## 2021-03-25 DIAGNOSIS — F201 Disorganized schizophrenia: Secondary | ICD-10-CM | POA: Diagnosis not present

## 2021-03-25 LAB — GLUCOSE, CAPILLARY
Glucose-Capillary: 116 mg/dL — ABNORMAL HIGH (ref 70–99)
Glucose-Capillary: 130 mg/dL — ABNORMAL HIGH (ref 70–99)
Glucose-Capillary: 83 mg/dL (ref 70–99)

## 2021-03-25 LAB — URINALYSIS, COMPLETE (UACMP) WITH MICROSCOPIC
Bilirubin Urine: NEGATIVE
Glucose, UA: NEGATIVE mg/dL
Hgb urine dipstick: NEGATIVE
Ketones, ur: NEGATIVE mg/dL
Nitrite: NEGATIVE
Protein, ur: NEGATIVE mg/dL
Specific Gravity, Urine: 1.02 (ref 1.005–1.030)
pH: 6 (ref 5.0–8.0)

## 2021-03-25 MED ORDER — LORAZEPAM 0.5 MG PO TABS
0.5000 mg | ORAL_TABLET | Freq: Two times a day (BID) | ORAL | Status: DC
  Administered 2021-03-25 – 2021-03-27 (×5): 0.5 mg via ORAL
  Filled 2021-03-25 (×5): qty 1

## 2021-03-25 MED ORDER — ARIPIPRAZOLE 15 MG PO TABS
15.0000 mg | ORAL_TABLET | Freq: Every day | ORAL | Status: DC
  Administered 2021-03-26 – 2021-03-28 (×3): 15 mg via ORAL
  Filled 2021-03-25 (×5): qty 1

## 2021-03-25 NOTE — Progress Notes (Signed)
Pt has continuously been loud in his room as he is responding to internal stimuli. Pt has needed redirection several times. Educated pt that there are other pt's on the hall too that are trying to sleep. Emphasized the importance that sleep is needed for his body and mind to rest. Pt said he felt like he was getting agitated.  Pt was administered his PRN zyprexa po for agitation at 2257. Asked pt if there was anything bothering him and he said he felt uncomfortable. He said that he likes to sleep with the [facility] on and it was turned on for him. Pt is currently falling asleep. Upon assessment, his eyes were closed. Respirations are even and unlabored. No distress has been observed. Q 15 min safety checks continue. Pt's safety has been maintained.

## 2021-03-25 NOTE — [facility] Group Notes (Signed)
Adult Psychoeducational Group Note  Date:  03/25/2021 Time:  4:45 PM  Group Topic/Focus:  Relaxation, Music Therapy  Participation Level:  Active  Participation Quality:  Appropriate  Affect:  Appropriate  Cognitive:  Appropriate  Insight: Appropriate  Engagement in Group:  Engaged  Modes of Intervention:  Activity  Additional Comments:    Brandon [name] 03/25/2021, 4:45 PM

## 2021-03-25 NOTE — Progress Notes (Signed)
   03/25/21 1300  Psych Admission Type (Psych Patients Only)  Admission Status Involuntary  Psychosocial Assessment  Patient Complaints Suspiciousness  Eye Contact Fair  Facial Expression Flat  Affect Preoccupied  Speech Soft  Interaction Minimal  Motor Activity Slow  Appearance/Hygiene Improved;Poor hygiene  Behavior Characteristics Cooperative  Mood Preoccupied  Aggressive Behavior  Effect No apparent injury  Thought Process  Coherency Circumstantial  Content Preoccupation  Delusions Paranoid  Perception Hallucinations  Hallucination None reported or observed  Judgment Poor  Confusion None  Danger to Self  Current suicidal ideation? Denies  Danger to Others  Danger to Others None reported or observed  Dar Note: Patient presents with anxious affect and mood.  Observed responding to internal stimuli and talking loud to himself.  Medications given as prescribed.  Routine safety checks maintained.  Patient is safe on and off the unit.

## 2021-03-25 NOTE — Progress Notes (Addendum)
[facility] MD Progress Note  03/25/2021 9:45 AM AUDRY PECINA  MRN:  [id number]  Chief Complaint: psychosis  Reason for Admission: DRAGO HAMMONDS is a 30 y.o. male with medical history significant for MS (followed by Neurology on Ocrevus), HTN, h/o PE on chronic anticoagulation, and schizophrenia who presented to [facility] on 03/04/21 for worsening AH.  The patient is currently on Hospital Day 20.   Chart Review from last 24 hours:  The patient's chart was reviewed and nursing notes were reviewed. The patient's case was discussed in multidisciplinary team meeting. Per nursing, at 10:32 PM, patient was responding to internal stimuli, needed several redirection, he was agitated.  He required as needed Zyprexa.  He had incontinence episode this morning and was actively responding to internal stimuli.  He provided urine sample for testing.  He complained of feeling weak.  His BP was elevated to 144/115 mmHg Per MAR he was compliant with scheduled medications and confirmed with pharmacy/nursing that he only required Zyprexa PRN X1 dose yesterday.  Information Obtained Today During Patient Interview: Patient is seen seen in dayroom with Dr. Mason Jim and Dr. Loleta Chance.  Patient states he is doing better.  He states that he does not hear any voices and denies VH.  When confronted about yesterday's episode of agitation, patient states he was just singing.   He endorses belief that astronaut's are sending him messages through TV.  He states that they are telling him " I am still up to get me money". When asked about his thinking about his aunt dying over the weekend, he states he had vision that his aunt was dead.  He denies any paranoia.  He has been eating and drinking well.  He denies any incontinence episode during the day but admits to enuresis overnight.  He denies any constipation or drooling.  He denies any burning or pain during urination.  He slept well last night.  When asked about abdominal pain, he states he  had hunger pain and has no present abdominal pain, N/V/D/C.  He denies SI, HI.  Called mom [name] [phone] -Mom gave consent to increase Abilify to 15 mg today.  Wants a call back if we need to add desmopressin tomorrow.  She states that metformin was for weight and not for diabetes.   Diagnosis: Principal Problem:   Schizophrenia, chronic with acute exacerbation (HCC) Active Problems:   Obesity, Class III, BMI 40-49.9 (morbid obesity) (HCC)   Unsteady gait   Multiple sclerosis (HCC)   Schizophrenia, disorganized type (HCC)  Total Time Spent in Direct Patient Care: I personally spent 30 minutes on the unit in direct patient care. The direct patient care time included face-to-face time with the patient, reviewing the patient's chart, communicating with other professionals, and coordinating care. Greater than 50% of this time was spent in counseling or coordinating care with the patient regarding goals of hospitalization, psycho-education, and discharge planning needs.   Past Psychiatric History: See H&P  Past Medical History:  Past Medical History:  Diagnosis Date   ADHD (attention deficit hyperactivity disorder)    Bipolar 1 disorder (HCC)    Chronic back pain    Chronic constipation    Chronic neck pain    Hypertension    Multiple sclerosis (HCC) 05/20/2013   left sided weakness, dysarthria   Non-compliance    Obesity    Pulmonary embolism (HCC)    Schizophrenia (HCC)    Stroke (HCC)    left sided deficits - pt's mother denies this  Past Surgical History:  Procedure Laterality Date   NO PAST SURGERIES     None     RADIOLOGY WITH ANESTHESIA N/A 01/16/2021   Procedure: MRI WITH ANESTHESIA CERVICAL AND THORASIC SPINE WITH AND WITHOUT CONTRAST;  Surgeon: Radiologist, Medication, MD;  Location: [facility] OR;  Service: Radiology;  Laterality: N/A;   TOOTH EXTRACTION N/A 06/24/2019   Procedure: DENTAL RESTORATION/EXTRACTION OF TEETH NUMBER ONE, SIXTEEN, SEVENTEEN, NINETEEN,  THIRTY-TWO;  Surgeon: Ocie Doyne, DDS;  Location: [facility] OR;  Service: Oral Surgery;  Laterality: N/A;   Family History:  Family History  Problem Relation Age of Onset   Diabetes Mother    ADD / ADHD Brother    Family Psychiatric  History: See H&P  Social History:  Social History   Substance and Sexual Activity  Alcohol Use Not Currently   Alcohol/week: 0.0 standard drinks   Comment: "A little bit"      Social History   Substance and Sexual Activity  Drug Use Not Currently   Types: Marijuana   Comment: Last used: unknown     Social History   Socioeconomic History   Marital status: Single    Spouse name: Not on file   Number of children: 0   Years of education: 11th   Highest education level: Not on file  Occupational History   Occupation: unemployed    Employer: [employer]    Comment: Disbaled  Tobacco Use   Smoking status: Every Day    Packs/day: 0.25    Types: Cigarettes   Smokeless tobacco: Never   Tobacco comments:    2 cigarettes a day  Vaping Use   Vaping Use: Never used  Substance and Sexual Activity   Alcohol use: Not Currently    Alcohol/week: 0.0 standard drinks    Comment: "A little bit"    Drug use: Not Currently    Types: Marijuana    Comment: Last used: unknown    Sexual activity: Not on file  Other Topics Concern   Not on file  Social History Narrative   Patient lives at home with his mother.   Disabled.   Education 11 th grade .   Right handed.   Drinks caffeine occassionally   Social Determinants of Corporate investment banker Strain: Not on file  Food Insecurity: Not on file  Transportation Needs: Not on file  Physical Activity: Not on file  Stress: Not on file  Social Connections: Not on file    Sleep: Good  Appetite:  Good  Current Medications: Current Facility-Administered Medications  Medication Dose Route Frequency Provider Last Rate Last Admin   acetaminophen (TYLENOL) tablet 650 mg  650 mg Oral Q6H PRN  Rankin, Shuvon B, NP   650 mg at 03/25/21 0828   alum & mag hydroxide-simeth (MAALOX/MYLANTA) 200-200-20 MG/5ML suspension 30 mL  30 mL Oral Q4H PRN Rankin, Shuvon B, NP   30 mL at 03/14/21 1651   amLODipine (NORVASC) tablet 5 mg  5 mg Oral Daily Nira Conn A, NP   5 mg at 03/25/21 5809   apixaban (ELIQUIS) tablet 5 mg  5 mg Oral BID Nira Conn A, NP   5 mg at 03/25/21 9833   ARIPiprazole (ABILIFY) tablet 10 mg  10 mg Oral Daily Karsten Ro, MD   10 mg at 03/25/21 8250   cloZAPine (CLOZARIL) tablet 150 mg  150 mg Oral QHS Karsten Ro, MD   150 mg at 03/24/21 2105   cloZAPine (CLOZARIL) tablet 75 mg  75 mg Oral q morning Comer Locket, MD   75 mg at 03/25/21 7253   docusate sodium (COLACE) capsule 400 mg  400 mg Oral QHS Nira Conn A, NP   400 mg at 03/24/21 2104   insulin aspart (novoLOG) injection 0-9 Units  0-9 Units Subcutaneous TID WC Bartholomew Crews E, MD   1 Units at 03/24/21 1711   latanoprost (XALATAN) 0.005 % ophthalmic solution 1 drop  1 drop Both Eyes QHS Nira Conn A, NP   1 drop at 03/24/21 2106   loratadine (CLARITIN) tablet 10 mg  10 mg Oral Daily Nira Conn A, NP   10 mg at 03/25/21 0824   LORazepam (ATIVAN) tablet 0.5 mg  0.5 mg Oral BID Karsten Ro, MD   0.5 mg at 03/25/21 0939   OLANZapine zydis (ZYPREXA) disintegrating tablet 10 mg  10 mg Oral Q8H PRN Comer Locket, MD   10 mg at 03/24/21 2257   And   LORazepam (ATIVAN) tablet 1 mg  1 mg Oral PRN Comer Locket, MD       And   ziprasidone (GEODON) injection 20 mg  20 mg Intramuscular PRN Comer Locket, MD       magnesium hydroxide (MILK OF MAGNESIA) suspension 30 mL  30 mL Oral Daily PRN Rankin, Shuvon B, NP       metoprolol succinate (TOPROL-XL) 24 hr tablet 25 mg  25 mg Oral Daily Mariel Craft, MD   25 mg at 03/25/21 6644   multivitamin with minerals tablet 1 tablet  1 tablet Oral Daily Nira Conn A, NP   1 tablet at 03/25/21 0347   nicotine (NICODERM CQ - dosed in mg/24 hours) patch 14 mg  14  mg Transdermal Daily Nira Conn A, NP   14 mg at 03/25/21 4259   topiramate (TOPAMAX) tablet 25 mg  25 mg Oral BID Nira Conn A, NP   25 mg at 03/25/21 5638    Lab Results:  Results for orders placed or performed during the hospital encounter of 03/05/21 (from the past 48 hour(s))  Glucose, capillary     Status: Abnormal   Collection Time: 03/23/21  5:09 PM  Result Value Ref Range   Glucose-Capillary 134 (H) 70 - 99 mg/dL    Comment: Glucose reference range applies only to samples taken after fasting for at least 8 hours.  Glucose, capillary     Status: Abnormal   Collection Time: 03/23/21  7:51 PM  Result Value Ref Range   Glucose-Capillary 115 (H) 70 - 99 mg/dL    Comment: Glucose reference range applies only to samples taken after fasting for at least 8 hours.  Glucose, capillary     Status: Abnormal   Collection Time: 03/24/21  5:33 AM  Result Value Ref Range   Glucose-Capillary 134 (H) 70 - 99 mg/dL    Comment: Glucose reference range applies only to samples taken after fasting for at least 8 hours.   Comment 1 Notify RN   Glucose, capillary     Status: Abnormal   Collection Time: 03/24/21 12:00 PM  Result Value Ref Range   Glucose-Capillary 133 (H) 70 - 99 mg/dL    Comment: Glucose reference range applies only to samples taken after fasting for at least 8 hours.  Glucose, capillary     Status: Abnormal   Collection Time: 03/24/21  5:07 PM  Result Value Ref Range   Glucose-Capillary 130 (H) 70 - 99 mg/dL    Comment:  Glucose reference range applies only to samples taken after fasting for at least 8 hours.  Glucose, capillary     Status: Abnormal   Collection Time: 03/25/21  5:46 AM  Result Value Ref Range   Glucose-Capillary 116 (H) 70 - 99 mg/dL    Comment: Glucose reference range applies only to samples taken after fasting for at least 8 hours.   Comment 1 Notify RN   Urinalysis, Complete w Microscopic Urine, Clean Catch     Status: Abnormal   Collection Time:  03/25/21  6:33 AM  Result Value Ref Range   Color, Urine YELLOW YELLOW   APPearance CLEAR CLEAR   Specific Gravity, Urine 1.020 1.005 - 1.030   pH 6.0 5.0 - 8.0   Glucose, UA NEGATIVE NEGATIVE mg/dL   Hgb urine dipstick NEGATIVE NEGATIVE   Bilirubin Urine NEGATIVE NEGATIVE   Ketones, ur NEGATIVE NEGATIVE mg/dL   Protein, ur NEGATIVE NEGATIVE mg/dL   Nitrite NEGATIVE NEGATIVE   Leukocytes,Ua TRACE (A) NEGATIVE   RBC / HPF 0-5 0 - 5 RBC/hpf   WBC, UA 0-5 0 - 5 WBC/hpf   Bacteria, UA RARE (A) NONE SEEN   Mucus PRESENT     Comment: Performed at [facility], [address] [place], [place] [address]    Blood Alcohol level:  Lab Results  Component Value Date   [facility] <10 03/04/2021   ETH <10 09/13/2020    Metabolic Disorder Labs: Lab Results  Component Value Date   HGBA1C 5.5 03/07/2021   MPG 111.15 03/07/2021   MPG 123 04/30/2020   Lab Results  Component Value Date   PROLACTIN 13.6 04/30/2020   Lab Results  Component Value Date   CHOL 151 03/07/2021   TRIG 154 (H) 03/07/2021   HDL 32 (L) 03/07/2021   CHOLHDL 4.7 03/07/2021   VLDL 31 03/07/2021   LDLCALC 88 03/07/2021   LDLCALC 77 04/30/2020    Physical Findings:  Musculoskeletal: Strength & Muscle Tone:untested Gait & Station: wide-based/unsteady- ambulates independently Patient leans: N/A  Psychiatric Specialty Exam:  Presentation  General Appearance: In hospital gown; improved hygiene in clean gown - no longer malodorous Eye Contact:Fair Speech:Dysarthric, mumbling quality  Speech Volume:Normal Handedness:Right   Mood and Affect  Mood: appears aloof  Affect:constricted - less guarded   Thought Process  Thought Processes: concrete, superficially goal directed and linear Descriptions of Associations:Intact  Orientation: oriented to self, month, date, year and city  Thought Content: Denies AVH and denies paranoia or first rank symptoms but endorses ideas of reference and delusion  that astronaut's are sending him messages through TV  History of Schizophrenia/Schizoaffective disorder:Yes  Duration of Psychotic Symptoms:Greater than six months  Hallucinations:Hallucinations: Denies  Suicidal Thoughts:Denied  Homicidal Thoughts:Denied   Sensorium  Memory:Fair  Judgment:Fair - compliant with meds  Insight:lacking   Executive Functions  Concentration:Fair  Attention Span:Fair  Recall:Untested today  Fund of Knowledge:Fair  Language:Fair   Psychomotor Activity  Psychomotor Activity: Normal - no acute agitation or restlessness no akathisias   Assets  Assets:Communication Skills; Desire for Improvement; Social Support   Sleep  Sleep: 5.5 hours  Physical Exam: Physical Exam Vitals and nursing note reviewed.  Constitutional:      Appearance: He is obese.  HENT:     Head: Normocephalic and atraumatic.  Pulmonary:     Effort: Pulmonary effort is normal.  Neurological:     Mental Status: He is alert and oriented to person, place, and time.   Review of Systems  Respiratory:  Negative for cough.   Cardiovascular:  Negative for chest pain.  Gastrointestinal:  Negative for constipation, diarrhea, nausea and vomiting.  Genitourinary:  Positive for frequency. Negative for dysuria and urgency.       Denies urinary incontinence during day but had enuresis at night  Blood pressure (!) 144/115, pulse (!) 127, temperature 98 F (36.7 C), temperature source Oral, resp. rate 20, height 5\' 11"  (1.803 m), weight (!) 145.2 kg, SpO2 100 %. Body mass index is 44.63 kg/m.   Treatment Plan Summary: Daily contact with patient to assess and evaluate symptoms and progress in treatment and Medication management  Mr. Vanleer is a 30 yr old male who presents under IVC for acute psychosis in setting of treatment resistance (mother verifies pill swallows daily). Had been on 2 antipsychotics (last haldol LAI dispensed early August, saphris 10 TID). In review of  chart, has typically not responded well to most antipsychotics with documented compliance. He was previously on clozapine 2-3 years ago with good response, stopped d/t drooling per pt and mother. We discussed restarting this with his mother and pt who provided oral consent/assent to restart clozapine. Patient has discontinued saphris given it has been ineffective and he has a vulnerable brain d/t MS and prior stroke. Patient has been clinically improving with dose titration up on Clozaril but has developed urinary incontinence, so there was concern for Clozaril being the culprit. His incontinence has improved with start of Abilify but is not resolved. He is having some breakthrough agitation with taper off Ativan and discontinuation of Saphris.   Schizophrenia, Chronic with acute exacerbation.  - Increase to Abilify 15 mg daily to help with psychosis and urinary incontinence. -Continue Clozaril 150 mg QHS and 75 mg in morning.  - Will hold Ativan at 0.5mg  bid given recent agitation and attempt tiration off this med slowly over time - no longer having signs of catatonia - Monitoring weekly CBC, CRP and Troponin with Clozaril titration  Troponin 3 (9/15), 7 (9/8), 4 (9/3)  CRP 2.6 (9/15), 1.9 (9/3), 6.9 (07/20/20)  CBC/ANC 8.09/5198 (9/14), 8.02/5599 (9/8) 7.08/4898 (9/2)  Clozaril level 141 (9/12)  Checking orthostatic vitals and monitoring for drooling or constipation - Last EKG  03/16/2021 is NSR, QTC interval 420. Repeat EKG (9/18) shows sinus tachycardia with QTC 426. - Monitoring orthostatic vitals with dose titration on Clozaril  New onset urinary incontinence - improving Nursing order - Behavior modification to avoid urinary incontinence.  -Voiding before bedtime.  -Remind him to void at least 3-4 times in a day.  -Avoid fluids before sleep -Increase Abilify to 15 mg daily.  Mom gave consent to increase Abilify. -UA shows trace leukocytes and rare bacteria. Urine culture pending.  Catatonia  - resolved -Continue Ativan to 0.5 mg bid (recently reduced from 1 twice daily ) and will hold at that dose due to recent agitation - Topamax (incidentally on for wt loss) also tx catatonia, clozapine has data in catatonia as well.    Nicotine Dependence: -Continue Nicotine Patch 14 mg daily   HTN: -Continue Amlodipine 5 mg daily. - Continue Toprol XL 25mg  daily   H/o PE -Continue Eliquis 5 mg BID   Chronic Constipation -Continue Colace 400 mg QHS   Obesity: -Continue Topamax 25 mg BID as written by PCP  Hypoglycemia - Held Metformin and will monitor FSBS prior to meals with SSI coverage - Recent FSBS 83-134   MS/gait disorder: - Reportedly getting Ocrevus by neurology was due on 9/8. - Infusion  for MS got cancelled due to inpatient /outpatient reimbursement issues. Mom informed and ok for him to get it after discharge.  - Home Health OT at D/c.   Other home meds: -Continue Xalatan 0.005% 1 drop both eyes QHS -Continue Claritin 10 mg daily   -DO NOT START TRAZODONE OR GABAPENTIN AS IT WAS STOPPED BY NEUROLOGY DUE TO GAIT INSTABILITY.   -Continue PRN's: Tylenol, Maalox, Atarax, Milk of Magnesia  Karsten Ro, MD , PGY2 03/25/2021, 9:45 AM

## 2021-03-25 NOTE — [facility] Group Notes (Signed)
Adult Psychoeducational Group Note  Date:  03/25/2021 Time:  10:28 AM  Group Topic/Focus:  Goals Group:   The focus of this group is to help patients establish daily goals to achieve during treatment and discuss how the patient can incorporate goal setting into their daily lives to aide in recovery.  Participation Level:  Active  Participation Quality:  Appropriate  Affect:    Cognitive:  Confused  Insight: Lacking  Engagement in Group:  Lacking  Modes of Intervention:  Clarification and Reality Testing  Additional Comments:  Pt said that his goal is to keep negative thoughts away  Donell Beers 03/25/2021, 10:28 AM

## 2021-03-25 NOTE — Progress Notes (Signed)
Pt continues to endorse AVH, pt stated he better learned to not let the voices bother him and not pay attention to them. Pt visible on the unit much of the evening, pt responding to internal stimuli during the evening.     03/25/21 2000  Psych Admission Type (Psych Patients Only)  Admission Status Involuntary  Psychosocial Assessment  Patient Complaints Suspiciousness  Eye Contact Fair  Facial Expression Flat  Affect Preoccupied  Speech Soft  Interaction Minimal  Motor Activity Slow  Appearance/Hygiene Improved;Poor hygiene  Aggressive Behavior  Effect No apparent injury  Thought Process  Coherency Circumstantial  Content Preoccupation  Delusions Paranoid  Perception Hallucinations  Hallucination Auditory;Visual  Judgment Poor  Confusion None  Danger to Self  Current suicidal ideation? Denies  Danger to Others  Danger to Others None reported or observed

## 2021-03-25 NOTE — Progress Notes (Signed)
Pt had an incontinent (urine) episode this morning. With a lot of encouragement and support, pt did take a shower and his bed linens were changed. Pt was actively responding to internal stimuli and appeared agitated. Pt also provided a urine specimen which has been picked up by lab. Pt c/o feeling weak and his blood pressure was assessed standing. Pt's blood pressure is elevated and he has a hx of HTN. Q 15 min safety checks continue. Pt's safety has been maintained.

## 2021-03-25 NOTE — BH IP Treatment Plan (Signed)
Interdisciplinary Treatment and Diagnostic Plan Update  03/25/2021 Time of Session: did not attend Brandon [name] MRN: [id number]  Principal Diagnosis: Schizophrenia, chronic with acute exacerbation (HCC)  Secondary Diagnoses: Principal Problem:   Schizophrenia, chronic with acute exacerbation (HCC) Active Problems:   Obesity, Class III, BMI 40-49.9 (morbid obesity) (HCC)   Unsteady gait   Multiple sclerosis (HCC)   Schizophrenia, disorganized type (HCC)   Current Medications:  Current Facility-Administered Medications  Medication Dose Route Frequency Provider Last Rate Last Admin   acetaminophen (TYLENOL) tablet 650 mg  650 mg Oral Q6H PRN Rankin, Shuvon B, NP   650 mg at 03/25/21 0828   alum & mag hydroxide-simeth (MAALOX/MYLANTA) 200-200-20 MG/5ML suspension 30 mL  30 mL Oral Q4H PRN Rankin, Shuvon B, NP   30 mL at 03/14/21 1651   amLODipine (NORVASC) tablet 5 mg  5 mg Oral Daily Nira Conn A, NP   5 mg at 03/25/21 6270   apixaban (ELIQUIS) tablet 5 mg  5 mg Oral BID Nira Conn A, NP   5 mg at 03/25/21 3500   ARIPiprazole (ABILIFY) tablet 10 mg  10 mg Oral Daily Karsten Ro, MD   10 mg at 03/25/21 9381   cloZAPine (CLOZARIL) tablet 150 mg  150 mg Oral QHS Karsten Ro, MD   150 mg at 03/24/21 2105   cloZAPine (CLOZARIL) tablet 75 mg  75 mg Oral q morning Comer Locket, MD   75 mg at 03/25/21 8299   docusate sodium (COLACE) capsule 400 mg  400 mg Oral QHS Nira Conn A, NP   400 mg at 03/24/21 2104   insulin aspart (novoLOG) injection 0-9 Units  0-9 Units Subcutaneous TID WC Bartholomew Crews E, MD   1 Units at 03/25/21 1307   latanoprost (XALATAN) 0.005 % ophthalmic solution 1 drop  1 drop Both Eyes QHS Nira Conn A, NP   1 drop at 03/24/21 2106   loratadine (CLARITIN) tablet 10 mg  10 mg Oral Daily Nira Conn A, NP   10 mg at 03/25/21 0824   LORazepam (ATIVAN) tablet 0.5 mg  0.5 mg Oral BID Karsten Ro, MD   0.5 mg at 03/25/21 0939   OLANZapine zydis (ZYPREXA)  disintegrating tablet 10 mg  10 mg Oral Q8H PRN Comer Locket, MD   10 mg at 03/24/21 2257   And   LORazepam (ATIVAN) tablet 1 mg  1 mg Oral PRN Comer Locket, MD       And   ziprasidone (GEODON) injection 20 mg  20 mg Intramuscular PRN Comer Locket, MD       magnesium hydroxide (MILK OF MAGNESIA) suspension 30 mL  30 mL Oral Daily PRN Rankin, Shuvon B, NP       metoprolol succinate (TOPROL-XL) 24 hr tablet 25 mg  25 mg Oral Daily Mariel Craft, MD   25 mg at 03/25/21 3716   multivitamin with minerals tablet 1 tablet  1 tablet Oral Daily Nira Conn A, NP   1 tablet at 03/25/21 9678   nicotine (NICODERM CQ - dosed in mg/24 hours) patch 14 mg  14 mg Transdermal Daily Nira Conn A, NP   14 mg at 03/25/21 9381   topiramate (TOPAMAX) tablet 25 mg  25 mg Oral BID Nira Conn A, NP   25 mg at 03/25/21 0175   PTA Medications: Medications Prior to Admission  Medication Sig Dispense Refill Last Dose   acetaminophen (TYLENOL) 500 MG tablet Take 1 tablet (500 mg  total) by mouth every 6 (six) hours as needed. (Patient taking differently: Take 500 mg by mouth every 6 (six) hours as needed for moderate pain.) 30 tablet 0    amantadine (SYMMETREL) 100 MG capsule Take 100 mg by mouth 2 (two) times daily.      amLODipine (NORVASC) 5 MG tablet Take 5 mg by mouth daily.      apixaban (ELIQUIS) 5 MG TABS tablet Take 5 mg by mouth 2 (two) times daily.       Asenapine Maleate 10 MG SUBL Place 1 tablet (10 mg total) under the tongue in the morning, at noon, and at bedtime. 90 tablet 1    clonazePAM (KLONOPIN) 0.5 MG tablet Take 0.5 mg by mouth daily as needed for anxiety.      docusate sodium (COLACE) 100 MG capsule Take 400 mg by mouth at bedtime.       haloperidol (HALDOL) 10 MG tablet Take 1 tablet (10 mg total) by mouth 2 (two) times daily. 60 tablet 0    haloperidol decanoate (HALDOL DECANOATE) 100 MG/ML injection Inject 3 mLs (300 mg total) into the muscle every 28 (twenty-eight) days. 3 mL 3     latanoprost (XALATAN) 0.005 % ophthalmic solution INSTILL 1 DROP INTO BOTH EYES EVERY NIGHT 7.5 mL 10    loratadine (CLARITIN) 10 MG tablet Take 10 mg by mouth daily.      metFORMIN (GLUCOPHAGE-XR) 500 MG 24 hr tablet Take 500 mg by mouth 2 (two) times daily with a meal.      Multiple Vitamin (MULTIVITAMIN WITH MINERALS) TABS tablet Take 1 tablet by mouth daily.       Patient Stressors: Financial difficulties   Health problems   Occupational concerns    Patient Strengths: Motivation for treatment/growth  Supportive family/friends   Treatment Modalities: Medication Management, Group therapy, Case management,  1 to 1 session with clinician, Psychoeducation, Recreational therapy.   Physician Treatment Plan for Primary Diagnosis: Schizophrenia, chronic with acute exacerbation (HCC) Long Term Goal(s): Improvement in symptoms so as ready for discharge   Short Term Goals: Ability to identify changes in lifestyle to reduce recurrence of condition will improve Ability to verbalize feelings will improve Ability to identify and develop effective coping behaviors will improve Compliance with prescribed medications will improve Ability to identify triggers associated with substance abuse/mental health issues will improve  Medication Management: Evaluate patient's response, side effects, and tolerance of medication regimen.  Therapeutic Interventions: 1 to 1 sessions, Unit Group sessions and Medication administration.  Evaluation of Outcomes: Progressing  Physician Treatment Plan for Secondary Diagnosis: Principal Problem:   Schizophrenia, chronic with acute exacerbation (HCC) Active Problems:   Obesity, Class III, BMI 40-49.9 (morbid obesity) (HCC)   Unsteady gait   Multiple sclerosis (HCC)   Schizophrenia, disorganized type (HCC)  Long Term Goal(s): Improvement in symptoms so as ready for discharge   Short Term Goals: Ability to identify changes in lifestyle to reduce recurrence of  condition will improve Ability to verbalize feelings will improve Ability to identify and develop effective coping behaviors will improve Compliance with prescribed medications will improve Ability to identify triggers associated with substance abuse/mental health issues will improve     Medication Management: Evaluate patient's response, side effects, and tolerance of medication regimen.  Therapeutic Interventions: 1 to 1 sessions, Unit Group sessions and Medication administration.  Evaluation of Outcomes: Progressing   RN Treatment Plan for Primary Diagnosis: Schizophrenia, chronic with acute exacerbation (HCC) Long Term Goal(s): Knowledge of disease and therapeutic  regimen to maintain health will improve  Short Term Goals: Ability to remain free from injury will improve, Ability to verbalize frustration and anger appropriately will improve, Ability to demonstrate self-control, Ability to participate in decision making will improve, Ability to verbalize feelings will improve, Ability to disclose and discuss suicidal ideas, Ability to identify and develop effective coping behaviors will improve, and Compliance with prescribed medications will improve  Medication Management: RN will administer medications as ordered by provider, will assess and evaluate patient's response and provide education to patient for prescribed medication. RN will report any adverse and/or side effects to prescribing provider.  Therapeutic Interventions: 1 on 1 counseling sessions, Psychoeducation, Medication administration, Evaluate responses to treatment, Monitor vital signs and CBGs as ordered, Perform/monitor CIWA, COWS, AIMS and Fall Risk screenings as ordered, Perform wound care treatments as ordered.  Evaluation of Outcomes: Progressing   LCSW Treatment Plan for Primary Diagnosis: Schizophrenia, chronic with acute exacerbation (HCC) Long Term Goal(s): Safe transition to appropriate next level of care at  discharge, Engage patient in therapeutic group addressing interpersonal concerns.  Short Term Goals: Engage patient in aftercare planning with referrals and resources, Increase social support, Increase ability to appropriately verbalize feelings, Increase emotional regulation, Facilitate acceptance of mental health diagnosis and concerns, Facilitate patient progression through stages of change regarding substance use diagnoses and concerns, Identify triggers associated with mental health/substance abuse issues, and Increase skills for wellness and recovery  Therapeutic Interventions: Assess for all discharge needs, 1 to 1 time with Social worker, Explore available resources and support systems, Assess for adequacy in community support network, Educate family and significant other(s) on suicide prevention, Complete Psychosocial Assessment, Interpersonal group therapy.  Evaluation of Outcomes: Progressing   Progress in Treatment: Attending groups: Yes. Participating in groups: Yes. Taking medication as prescribed: Yes. Toleration medication: Yes. Family/Significant other contact made: Yes, individual(s) contacted:  mother, BYRL LATIN Patient understands diagnosis: Yes. Discussing patient identified problems/goals with staff: Yes. Medical problems stabilized or resolved: Yes. Denies suicidal/homicidal ideation: Yes. Issues/concerns per patient self-inventory: Yes.   New problem(s) identified: No, Describe:  none reported  New Short Term/Long Term Goal(s)  medication stabilization, elimination of SI thoughts, development of comprehensive mental wellness plan.    Patient Goals:  did not attend  Discharge Plan or Barriers: CSW will continue to follow and assess for appropriate referrals and possible discharge planning.    Reason for Continuation of Hospitalization: Delusions  Hallucinations Mania Medication stabilization  Estimated Length of Stay: 3-5 days   Scribe for  Treatment Team: Beatris Si, LCSW 03/25/2021 3:21 PM

## 2021-03-25 NOTE — Group Note (Signed)
[facility] LCSW Group Therapy   Type of Therapy and Topic:  Group Therapy:  Wellness   Participation Level: Active  Description of Group: This group allows individuals to explore the 6 dimensions of wellness, including spiritual, emotional, intellectual, physical, social, environmental, financial and spiritual. Patients will learn to different ways to practice wellness to improve well-being. Patients also participated in a conversation about what wellness means to them.   Individuals will think about ways in which they currently practice wellness as well as ways they can improve their wellness and new ways to practice wellness.       Therapeutic Goals Patient will verbalize 1 pr 2 we;;mess areas where they are doing well. Patient will identify 2 areas where they would like to improve their wellness.   Patient will provide a definition of what wellness means to them.  Patients will reflect on current hospitalization and primary areas to maintain mental health to prevent re-hospitalization.     Summary of Patient Progress:  Patient participated in a self care inventory and discussed working on ways to say no more to help care for himself. Patient was disorganized at times and CSW had to clarify instructions and questions but was able to be redirected.        Therapeutic Modalities Cognitive Behavioral Therapy Motivational Interviewing    Alfretta Pinch, LCSW, LCAS Clincal Social Worker  [facility]

## 2021-03-25 NOTE — Progress Notes (Signed)
Adult Psychoeducational Group Note  Date:  03/25/2021 Time:  9:41 PM  Group Topic/Focus:  Wrap-Up Group:   The focus of this group is to help patients review their daily goal of treatment and discuss progress on daily workbooks.  Participation Level:  Active  Participation Quality:  Appropriate  Affect:  Anxious, Appropriate, and Excited  Cognitive:  Appropriate and Disorganized  Insight: Limited  Engagement in Group:  Limited  Modes of Intervention:  Discussion  Additional Comments:  Pt stated his goal for today was to focus on his treatment plan. Pt stated he accomplished his goal today. Pt stated he talked with his doctor and social worker about his care today. Pt rated his overall day a 10. Pt stated he was able to contact his mother today which improved his overall day. Pt stated he felt better about himself today. Pt stated staff brought all meals today. Pt stated he took all medications provided today. Pt stated he attend all groups held today. Pt stated his appetite was pretty good today. Pt rated sleep last night was pretty good. Pt stated the goal tonight was to get some rest. Pt stated he had no physical pain today. Pt deny visual hallucinations and auditory issues tonight. Pt denies thoughts of harming himself or others. Pt stated he would alert staff if anything changed.  Felipa Furnace 03/25/2021, 9:41 PM

## 2021-03-26 LAB — URINE CULTURE

## 2021-03-26 LAB — GLUCOSE, CAPILLARY
Glucose-Capillary: 112 mg/dL — ABNORMAL HIGH (ref 70–99)
Glucose-Capillary: 148 mg/dL — ABNORMAL HIGH (ref 70–99)
Glucose-Capillary: 138 mg/dL — ABNORMAL HIGH (ref 70–99)

## 2021-03-26 NOTE — Progress Notes (Signed)
Pt continued to yell at voices in his room , pt given PRN Ativan per [facility]

## 2021-03-26 NOTE — [facility] Counselor (Signed)
CSW assisted in facilitating pt to participate in IVC court hearing. Per judge this pt's IVC will continue an additional 7 days.      Ruthann Cancer MSW, LCSW Clincal Social Worker  [facility]

## 2021-03-26 NOTE — Group Note (Signed)
Recreation Therapy Group Note   Group Topic:Self-Esteem  Group Date: 03/26/2021 Start Time: 0950 End Time: 1025 Facilitators: Caroll Rancher, LRT/CTRS Location: [address] Dayroom  Goal Area(s) Addresses:  Patient will be able to identify what self esteem is. Patient will be able to identify positive traits about themselves. Patient will be able to understand importance of having positive self esteem.    Group Description:  Patients were introduced to LRT and shared the group rules. Patients and LRT discussed positive and negative self esteem and what influences them.  Patients were then given a picture of a blank face, and told to illustrate some of the positive attributes about themselves.  It patients had a hard time identifying the positive, they were given a list of positive traits that would help them identify traits that describe them. Patients were given markers to complete the assignment. Patients shared their completed assignment with each other. Patients were debriefed on the importance of recognizing the positive in themselves even when they don't see it.   Affect/Mood: N/A   Participation Level: Did not attend    Clinical Observations/Individualized Feedback: Pt did not attend group session.    Plan: Continue to engage patient in RT group sessions 2-3x/week.   Caroll Rancher, LRT/CTRS 03/26/2021 11:22 AM

## 2021-03-26 NOTE — Progress Notes (Signed)
Pt night time fluids have been reduced and pt reminded to void before going to sleep.

## 2021-03-26 NOTE — Progress Notes (Signed)
Pt given HS medications, pt continued to respond to internal stimuli, pt began punching self in the face and talking to the voices, pt given PRN Zyprexa per [facility].      03/26/21 2100  Psych Admission Type (Psych Patients Only)  Admission Status Involuntary  Psychosocial Assessment  Patient Complaints Suspiciousness  Eye Contact Fair  Facial Expression Flat  Affect Preoccupied  Speech Soft  Interaction Minimal  Motor Activity Slow  Appearance/Hygiene Improved;Poor hygiene  Behavior Characteristics Cooperative  Mood Preoccupied  Aggressive Behavior  Effect No apparent injury  Thought Process  Coherency Circumstantial  Content Preoccupation  Delusions Paranoid  Perception Hallucinations  Hallucination Auditory;Visual  Judgment Poor  Confusion None  Danger to Self  Current suicidal ideation? Denies  Danger to Others  Danger to Others None reported or observed

## 2021-03-26 NOTE — Progress Notes (Addendum)
[facility] MD Progress Note  03/26/2021 6:12 PM Brandon [name]  MRN:  [id number]  Chief Complaint: psychosis  Reason for Admission: Brandon [name] is a 30 y.o. male with medical history significant for MS (followed by Neurology on Ocrevus), HTN, h/o PE on chronic anticoagulation, and schizophrenia who presented to [facility] on 03/04/21 for worsening AH.  The patient is currently on Hospital Day 21.   Chart Review from last 24 hours:  The patient's chart was reviewed and nursing notes were reviewed. The patient's case was discussed in multidisciplinary team meeting. Per nursing, he was seen responding to internal stimuli and talking to himself loudly.  He still endorsed auditory visual hallucination with nursing staff.   this morning he was found sitting in shower.Assessed by myself and Dr. Loleta Chance,  Denied any falls, symptoms, headache.  He was alert and oriented.  Per [facility] he was compliant with scheduled medications and confirmed with pharmacy/nursing that he only required Zyprexa PRN X1 dose and Tylenol yesterday.  Information Obtained Today During Patient Interview: Patient is seen seen in dayroom.  Patient states he is doing better.  He states that he does not hear any voices and denies VH.  When confronted about yesterday's episode about talking to himself, patient states he has a phone hooked up inside his body and he talks to his family with that phone.   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  He denies receiving messages or signals through TV.  He denies thought broadcasting.  He denies any paranoia.  He has been eating and drinking well.  He denies any incontinence episode last night.  He denies any constipation or drooling.  He denies any burning or pain during urination.  He slept well last night.  He denies abdominal pain, N/V/D/C.  He denies SI, HI.   Diagnosis: Principal Problem:   Schizophrenia, chronic with acute exacerbation (HCC) Active  Problems:   Obesity, Class III, BMI 40-49.9 (morbid obesity) (HCC)   Unsteady gait   Multiple sclerosis (HCC)   Schizophrenia, disorganized type (HCC)  Total Time Spent in Direct Patient Care: I personally spent 30 minutes on the unit in direct patient care. The direct patient care time included face-to-face time with the patient, reviewing the patient's chart, communicating with other professionals, and coordinating care. Greater than 50% of this time was spent in counseling or coordinating care with the patient regarding goals of hospitalization, psycho-education, and discharge planning needs.   Past Psychiatric History: See H&P  Past Medical History:  Past Medical History:  Diagnosis Date   ADHD (attention deficit hyperactivity disorder)    Bipolar 1 disorder (HCC)    Chronic back pain    Chronic constipation    Chronic neck pain    Hypertension    Multiple sclerosis (HCC) 05/20/2013   left sided weakness, dysarthria   Non-compliance    Obesity    Pulmonary embolism (HCC)    Schizophrenia (HCC)    Stroke (HCC)    left sided deficits - pt's mother denies this    Past Surgical History:  Procedure Laterality Date   NO PAST SURGERIES     None     RADIOLOGY WITH ANESTHESIA N/A 01/16/2021   Procedure: MRI WITH ANESTHESIA CERVICAL AND THORASIC SPINE WITH AND WITHOUT CONTRAST;  Surgeon: Radiologist, Medication, MD;  Location: [facility] OR;  Service: Radiology;  Laterality: N/A;   TOOTH EXTRACTION N/A 06/24/2019   Procedure: DENTAL RESTORATION/EXTRACTION OF TEETH NUMBER  ONE, SIXTEEN, SEVENTEEN, NINETEEN, THIRTY-TWO;  Surgeon: Ocie Doyne, DDS;  Location: [facility] OR;  Service: Oral Surgery;  Laterality: N/A;   Family History:  Family History  Problem Relation Age of Onset   Diabetes Mother    ADD / ADHD Brother    Family Psychiatric  History: See H&P  Social History:  Social History   Substance and Sexual Activity  Alcohol Use Not Currently   Alcohol/week: 0.0 standard drinks    Comment: "A little bit"      Social History   Substance and Sexual Activity  Drug Use Not Currently   Types: Marijuana   Comment: Last used: unknown     Social History   Socioeconomic History   Marital status: Single    Spouse name: Not on file   Number of children: 0   Years of education: 11th   Highest education level: Not on file  Occupational History   Occupation: unemployed    Employer: [employer]    Comment: Disbaled  Tobacco Use   Smoking status: Every Day    Packs/day: 0.25    Types: Cigarettes   Smokeless tobacco: Never   Tobacco comments:    2 cigarettes a day  Vaping Use   Vaping Use: Never used  Substance and Sexual Activity   Alcohol use: Not Currently    Alcohol/week: 0.0 standard drinks    Comment: "A little bit"    Drug use: Not Currently    Types: Marijuana    Comment: Last used: unknown    Sexual activity: Not on file  Other Topics Concern   Not on file  Social History Narrative   Patient lives at home with his mother.   Disabled.   Education 11 th grade .   Right handed.   Drinks caffeine occassionally   Social Determinants of Corporate investment banker Strain: Not on file  Food Insecurity: Not on file  Transportation Needs: Not on file  Physical Activity: Not on file  Stress: Not on file  Social Connections: Not on file    Sleep: Good  Appetite:  Good  Current Medications: Current Facility-Administered Medications  Medication Dose Route Frequency Provider Last Rate Last Admin   acetaminophen (TYLENOL) tablet 650 mg  650 mg Oral Q6H PRN Rankin, Shuvon B, NP   650 mg at 03/25/21 0828   alum & mag hydroxide-simeth (MAALOX/MYLANTA) 200-200-20 MG/5ML suspension 30 mL  30 mL Oral Q4H PRN Rankin, Shuvon B, NP   30 mL at 03/14/21 1651   amLODipine (NORVASC) tablet 5 mg  5 mg Oral Daily Nira Conn A, NP   5 mg at 03/26/21 0755   apixaban (ELIQUIS) tablet 5 mg  5 mg Oral BID Nira Conn A, NP   5 mg at 03/26/21 1631   ARIPiprazole  (ABILIFY) tablet 15 mg  15 mg Oral Daily Mason Jim, Amy E, MD   15 mg at 03/26/21 0755   cloZAPine (CLOZARIL) tablet 150 mg  150 mg Oral QHS Karsten Ro, MD   150 mg at 03/25/21 2031   cloZAPine (CLOZARIL) tablet 75 mg  75 mg Oral q morning Mason Jim, Amy E, MD   75 mg at 03/26/21 1030   docusate sodium (COLACE) capsule 400 mg  400 mg Oral QHS Nira Conn A, NP   400 mg at 03/25/21 2031   insulin aspart (novoLOG) injection 0-9 Units  0-9 Units Subcutaneous TID WC Comer Locket, MD   1 Units at 03/26/21 1659   latanoprost (  XALATAN) 0.005 % ophthalmic solution 1 drop  1 drop Both Eyes QHS Nira Conn A, NP   1 drop at 03/25/21 2033   loratadine (CLARITIN) tablet 10 mg  10 mg Oral Daily Nira Conn A, NP   10 mg at 03/26/21 0755   LORazepam (ATIVAN) tablet 0.5 mg  0.5 mg Oral BID Karsten Ro, MD   0.5 mg at 03/26/21 1631   OLANZapine zydis (ZYPREXA) disintegrating tablet 10 mg  10 mg Oral Q8H PRN Comer Locket, MD   10 mg at 03/25/21 2136   And   LORazepam (ATIVAN) tablet 1 mg  1 mg Oral PRN Comer Locket, MD       And   ziprasidone (GEODON) injection 20 mg  20 mg Intramuscular PRN Comer Locket, MD       magnesium hydroxide (MILK OF MAGNESIA) suspension 30 mL  30 mL Oral Daily PRN Rankin, Shuvon B, NP       metoprolol succinate (TOPROL-XL) 24 hr tablet 25 mg  25 mg Oral Daily Mariel Craft, MD   25 mg at 03/26/21 0755   multivitamin with minerals tablet 1 tablet  1 tablet Oral Daily Nira Conn A, NP   1 tablet at 03/26/21 0755   nicotine (NICODERM CQ - dosed in mg/24 hours) patch 14 mg  14 mg Transdermal Daily Nira Conn A, NP   14 mg at 03/26/21 0753   topiramate (TOPAMAX) tablet 25 mg  25 mg Oral BID Nira Conn A, NP   25 mg at 03/26/21 1631    Lab Results:  Results for orders placed or performed during the hospital encounter of 03/05/21 (from the past 48 hour(s))  Glucose, capillary     Status: Abnormal   Collection Time: 03/25/21  5:46 AM  Result Value Ref Range    Glucose-Capillary 116 (H) 70 - 99 mg/dL    Comment: Glucose reference range applies only to samples taken after fasting for at least 8 hours.   Comment 1 Notify RN   Urine Culture     Status: Abnormal   Collection Time: 03/25/21  6:33 AM   Specimen: Urine, Clean Catch  Result Value Ref Range   Specimen Description      URINE, CLEAN CATCH Performed at [facility], [address], [place], [place] [address]    Special Requests      NONE Performed at [facility], [address] [place], [place] [address]    Culture MULTIPLE SPECIES PRESENT, SUGGEST RECOLLECTION (A)    Report Status 03/26/2021 FINAL   Urinalysis, Complete w Microscopic Urine, Clean Catch     Status: Abnormal   Collection Time: 03/25/21  6:33 AM  Result Value Ref Range   Color, Urine YELLOW YELLOW   APPearance CLEAR CLEAR   Specific Gravity, Urine 1.020 1.005 - 1.030   pH 6.0 5.0 - 8.0   Glucose, UA NEGATIVE NEGATIVE mg/dL   Hgb urine dipstick NEGATIVE NEGATIVE   Bilirubin Urine NEGATIVE NEGATIVE   Ketones, ur NEGATIVE NEGATIVE mg/dL   Protein, ur NEGATIVE NEGATIVE mg/dL   Nitrite NEGATIVE NEGATIVE   Leukocytes,Ua TRACE (A) NEGATIVE   RBC / HPF 0-5 0 - 5 RBC/hpf   WBC, UA 0-5 0 - 5 WBC/hpf   Bacteria, UA RARE (A) NONE SEEN   Mucus PRESENT     Comment: Performed at [facility], [address], [place], [place] [address]  Glucose, capillary     Status: Abnormal  Collection Time: 03/25/21 11:44 AM  Result Value Ref Range   Glucose-Capillary 130 (H) 70 - 99 mg/dL    Comment: Glucose reference range applies only to samples taken after fasting for at least 8 hours.  Glucose, capillary     Status: None   Collection Time: 03/25/21  5:15 PM  Result Value Ref Range   Glucose-Capillary 83 70 - 99 mg/dL    Comment: Glucose reference range applies only to samples taken after fasting for at least 8 hours.  Glucose, capillary     Status: Abnormal   Collection  Time: 03/26/21  6:23 AM  Result Value Ref Range   Glucose-Capillary 112 (H) 70 - 99 mg/dL    Comment: Glucose reference range applies only to samples taken after fasting for at least 8 hours.  Glucose, capillary     Status: Abnormal   Collection Time: 03/26/21 11:56 AM  Result Value Ref Range   Glucose-Capillary 148 (H) 70 - 99 mg/dL    Comment: Glucose reference range applies only to samples taken after fasting for at least 8 hours.  Glucose, capillary     Status: Abnormal   Collection Time: 03/26/21  4:50 PM  Result Value Ref Range   Glucose-Capillary 138 (H) 70 - 99 mg/dL    Comment: Glucose reference range applies only to samples taken after fasting for at least 8 hours.    Blood Alcohol level:  Lab Results  Component Value Date   [facility] <10 03/04/2021   ETH <10 09/13/2020    Metabolic Disorder Labs: Lab Results  Component Value Date   HGBA1C 5.5 03/07/2021   MPG 111.15 03/07/2021   MPG 123 04/30/2020   Lab Results  Component Value Date   PROLACTIN 13.6 04/30/2020   Lab Results  Component Value Date   CHOL 151 03/07/2021   TRIG 154 (H) 03/07/2021   HDL 32 (L) 03/07/2021   CHOLHDL 4.7 03/07/2021   VLDL 31 03/07/2021   LDLCALC 88 03/07/2021   LDLCALC 77 04/30/2020    Physical Findings:  Musculoskeletal: Strength & Muscle Tone:untested Gait & Station: wide-based/unsteady- ambulates independently Patient leans: N/A  Psychiatric Specialty Exam:  Presentation  General Appearance: In hospital gown; improved hygiene in clean gown - no longer malodorous Eye Contact:Fair Speech:Dysarthric, mumbling quality  Speech Volume:Normal Handedness:Right   Mood and Affect  Mood: appears aloof  Affect:constricted - less guarded   Thought Process  Thought Processes: concrete, superficially goal directed and linear Descriptions of Associations:Intact  Orientation: oriented to self, month, date, year and city  Thought Content: Denies AVH and denies paranoia or first  rank symptoms but endorses ideas of reference and delusion that astronaut's are 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  He also thinks that he has a built-in phone in his body through which he can contact his family.  History of Schizophrenia/Schizoaffective disorder:Yes  Duration of Psychotic Symptoms:Greater than six months  Hallucinations:Hallucinations: Denies  Suicidal Thoughts:Denied  Homicidal Thoughts:Denied   Sensorium  Memory:Fair  Judgment:Fair - compliant with meds  Insight:lacking   Executive Functions  Concentration:Fair  Attention Span:Fair  Recall:Untested today  Fund of Knowledge:Fair  Language:Fair   Psychomotor Activity  Psychomotor Activity: Normal - no acute agitation or restlessness no akathisias   Assets  Assets:Communication Skills; Desire for Improvement; Social Support   Sleep  Sleep: 5.5 hours  Physical Exam: Physical Exam Vitals and nursing note reviewed.  Constitutional:      Appearance: He is obese.  HENT:  Head: Normocephalic and atraumatic.  Pulmonary:     Effort: Pulmonary effort is normal.  Neurological:     Mental Status: He is alert and oriented to person, place, and time.   Review of Systems  Respiratory:  Negative for cough.   Cardiovascular:  Negative for chest pain.  Gastrointestinal:  Negative for constipation, diarrhea, nausea and vomiting.  Genitourinary:  Negative for dysuria, frequency and urgency.       Denies urinary incontinence during day but had enuresis at night  Blood pressure (!) 136/98, pulse (!) 102, temperature 98.3 F (36.8 C), temperature source Oral, resp. rate 18, height 5\' 11"  (1.803 m), weight (!) 145.2 kg, SpO2 100 %. Body mass index is 44.63 kg/m.   Treatment Plan Summary: Daily contact with patient to assess and evaluate symptoms and progress in treatment and Medication management  Brandon [name] is a 30 yr old male who presents under IVC for acute psychosis in setting of  treatment resistance (mother verifies pill swallows daily). Had been on 2 antipsychotics (last haldol LAI dispensed early August, saphris 10 TID). In review of chart, has typically not responded well to most antipsychotics with documented compliance. He was previously on clozapine 2-3 years ago with good response, stopped d/t drooling per pt and mother. We discussed restarting this with his mother and pt who provided oral consent/assent to restart clozapine. Patient has discontinued saphris given it has been ineffective and he has a vulnerable brain d/t MS and prior stroke. Patient has been clinically improving with dose titration up on Clozaril but has developed urinary incontinence, so there was concern for Clozaril being the culprit. His incontinence has improved with start of Abilify.   Schizophrenia, Chronic with acute exacerbation.  -Continue  Abilify 15 mg daily to help with psychosis and urinary incontinence. -Continue Clozaril 150 mg QHS and 75 mg in morning.  - Will hold Ativan at 0.5mg  bid given recent agitation and attempt tiration off this med slowly over time - no longer having signs of catatonia - Monitoring weekly CBC, CRP and Troponin with Clozaril titration  Troponin 3 (9/15), 7 (9/8), 4 (9/3)  CRP 2.6 (9/15), 1.9 (9/3), 6.9 (07/20/20)  CBC/ANC 8.09/5198 (9/14), 8.02/5599 (9/8) 7.08/4898 (9/2)  Clozaril level 141 (9/12)  Checking orthostatic vitals and monitoring for drooling or constipation - Last EKG  03/16/2021 is NSR, QTC interval 420. Repeat EKG (9/18) shows sinus tachycardia with QTC 426. - Monitoring orthostatic vitals with dose titration on Clozaril  New onset urinary incontinence - improving Nursing order - Behavior modification to avoid urinary incontinence.  -Voiding before bedtime.  -Remind him to void at least 3-4 times in a day.  -Avoid fluids before sleep -Increase Abilify to 15 mg daily.  Mom gave consent to increase Abilify. -UA shows trace leukocytes and rare  bacteria. Urine culture shows multiple species present suggested recollection.  Ordered  Catatonia - resolved -Continue Ativan to 0.5 mg bid (recently reduced from 1 twice daily ) and will hold at that dose due to recent agitation - Topamax (incidentally on for wt loss) also tx catatonia, clozapine has data in catatonia as well.    Nicotine Dependence: -Continue Nicotine Patch 14 mg daily   HTN: -Continue Amlodipine 5 mg daily. - Continue Toprol XL 25mg  daily   H/o PE -Continue Eliquis 5 mg BID   Chronic Constipation -Continue Colace 400 mg QHS   Obesity: -Continue Topamax 25 mg BID as written by PCP  Hypoglycemia - Held Metformin and will monitor FSBS prior  to meals with SSI coverage - Recent FSBS 83-134   MS/gait disorder: - Reportedly getting Ocrevus by neurology was due on 9/8. - Infusion for MS got cancelled due to inpatient /outpatient reimbursement issues. Mom informed and ok for him to get it after discharge.  - Home Health OT at D/c.   Other home meds: -Continue Xalatan 0.005% 1 drop both eyes QHS -Continue Claritin 10 mg daily   -DO NOT START TRAZODONE OR GABAPENTIN AS IT WAS STOPPED BY NEUROLOGY DUE TO GAIT INSTABILITY.   -Continue PRN's: Tylenol, Maalox, Atarax, Milk of Magnesia  Karsten Ro, MD , PGY2 03/26/2021, 6:12 PM

## 2021-03-26 NOTE — Progress Notes (Signed)
   03/26/21 1300  Psych Admission Type (Psych Patients Only)  Admission Status Involuntary  Psychosocial Assessment  Patient Complaints Suspiciousness  Eye Contact Fair  Facial Expression Flat  Affect Preoccupied  Speech Soft  Interaction Minimal  Motor Activity Slow  Appearance/Hygiene Improved;Poor hygiene  Behavior Characteristics Cooperative  Mood Preoccupied  Aggressive Behavior  Effect No apparent injury  Thought Process  Coherency Circumstantial  Content Preoccupation  Delusions Paranoid  Perception Hallucinations  Hallucination Auditory;Visual  Judgment Poor  Confusion None  Danger to Self  Current suicidal ideation? Denies  Danger to Others  Danger to Others None reported or observed  Dar Note: Patient presents with anxious affect and mood.  Patient visible in milieu interacting with staff and peers.  Observed talking to himself and laughing inappropriate.  Medications given as prescribed.  Routine safety checks maintained.  Patient is safe on the unit.

## 2021-03-26 NOTE — Progress Notes (Signed)
Pt was taking a shower, pt was found sitting in the shower, pt stated he sat down in the shower. Pt vital signs were taken , pt endorsed no pain.     03/26/21 0710  Vitals  Temp 98.6 F (37 C)  Temp Source Oral  BP 129/76  MAP (mmHg) 93  BP Location Left Arm  BP Method Automatic  Patient Position (if appropriate) Lying  Pulse Rate 98  Pulse Rate Source Monitor  Resp 14  Oxygen Therapy  SpO2 100 %  O2 Device Room Air  Pain Assessment  Pain Scale 0-10  Pain Score 0  Neurological  Neuro (WDL) WDL  Musculoskeletal  Musculoskeletal (WDL) X  Integumentary  Integumentary (WDL) X

## 2021-03-26 NOTE — Progress Notes (Signed)
Adult Psychoeducational Group Note  Date:  03/26/2021 Time:  9:21 PM  Group Topic/Focus:  Wrap-Up Group:   The focus of this group is to help patients review their daily goal of treatment and discuss progress on daily workbooks.  Participation Level:  Active  Participation Quality:  Appropriate  Affect:  Appropriate  Cognitive:  Alert and Appropriate  Insight: Appropriate  Engagement in Group:  Developing/Improving  Modes of Intervention:  Discussion  Additional Comments:  Pt stated his goal for today was to focus on his treatment plan. Pt stated he accomplished his goal today. Pt stated he talked with his doctor and social worker about his care today. Pt stated he talk with his doctor about returning home and staying back with his mother. Pt rated his overall day a 10. Pt stated he was able to contact his mother and his girlfriend today which improved his overall day. Pt stated he felt better about himself today. Pt stated staff brought all meals today. Pt stated he took all medications provided today. Pt stated he attend all groups held today. Pt stated his appetite was pretty good today. Pt rated sleep last night was pretty good. Pt stated the goal tonight was to get some rest. Pt stated he had no physical pain today. Pt deny visual hallucinations and auditory issues tonight. Pt denies thoughts of harming himself or others. Pt stated he would alert staff if anything changed.  Felipa Furnace 03/26/2021, 9:21 PM

## 2021-03-27 LAB — GLUCOSE, CAPILLARY
Glucose-Capillary: 114 mg/dL — ABNORMAL HIGH (ref 70–99)
Glucose-Capillary: 180 mg/dL — ABNORMAL HIGH (ref 70–99)
Glucose-Capillary: 152 mg/dL — ABNORMAL HIGH (ref 70–99)

## 2021-03-27 MED ORDER — LORAZEPAM 0.5 MG PO TABS: 0.5000 mg | ORAL_TABLET | Freq: Every day | ORAL | Status: DC

## 2021-03-27 MED ORDER — LORAZEPAM 0.5 MG PO TABS: 0.5000 mg | ORAL_TABLET | Freq: Once | ORAL | Status: DC

## 2021-03-27 MED ORDER — CLOZAPINE 100 MG PO TABS
100.0000 mg | ORAL_TABLET | Freq: Every morning | ORAL | Status: DC
  Administered 2021-03-28: 100 mg via ORAL
  Filled 2021-03-27 (×4): qty 1

## 2021-03-27 NOTE — Group Note (Signed)
Recreation Therapy Group Note   Group Topic:Health and Wellness  Group Date: 03/27/2021 Start Time: 1005 End Time: 1035 Facilitators: Caroll Rancher, LRT/CTRS Location: [address] Dayroom  Goal Area(s) Addresses:  Patient will actively participate in selected exercises.   Patient will verbalize benefit of exercise during group session. Patient will acknowledge benefits of exercise.   Group Description: LRT and patients engaged in a series of exercises.  LRT and patients took turns leading group dance moves or an actual exercise to keep everyone moving.  Patients were to get at least 30 minutes of movement.  Patients were encouraged take breaks or get water if needed.  Affect/Mood: Appropriate   Participation Level: Moderate   Participation Quality: Independent   Behavior: Appropriate and Attentive    Speech/Thought Process: Pressured and Slurred   Insight: Fair   Judgement: Fair    Modes of Intervention: Music and Physical   Patient Response to Interventions:  Attentive and Engaged   Education Outcome:  Acknowledges education and In group clarification offered    Clinical Observations/Individualized Feedback: Pt was appropriate and on task while in group.  Pt was able to lead and complete the movements presented in group.  Pt did leave at one point but returned.  Pt was engaged during group session.    Plan: Continue to engage patient in RT group sessions 2-3x/week.   Caroll Rancher, LRT/CTRS 03/27/2021 12:56 PM

## 2021-03-27 NOTE — [facility] Group Notes (Signed)
Patient participated in relaxation/music therapy group.

## 2021-03-27 NOTE — Group Note (Signed)
[facility] LCSW Group Therapy Note   Group Date: 03/27/2021 Start Time: 1300 End Time: 1400   Type of Therapy/Topic:  Group Therapy:  Emotion Regulation  Participation Level:  Active    Description of Group:    The purpose of this group is to assist patients in learning to regulate negative emotions and experience positive emotions. Patients will be guided to discuss ways in which they have been vulnerable to their negative emotions. These vulnerabilities will be juxtaposed with experiences of positive emotions or situations, and patients challenged to use positive emotions to combat negative ones. Special emphasis will be placed on coping with negative emotions in conflict situations, and patients will process healthy conflict resolution skills.  Therapeutic Goals: Patient will identify two positive emotions or experiences to reflect on in order to balance out negative emotions:  Patient will label two or more emotions that they find the most difficult to experience:  Patient will be able to demonstrate positive conflict resolution skills through discussion or role plays:   Summary of Patient Progress: Pt attended and participated in group. Pt shared he has felt serene today due to waking up and having clear thoughts and not hearing any voices.       Therapeutic Modalities:   Cognitive Behavioral Therapy Feelings Identification Dialectical Behavioral Therapy   Otelia Santee, LCSW

## 2021-03-27 NOTE — Progress Notes (Signed)
Adult Psychoeducational Group Note  Date:  03/27/2021 Time:  9:10 PM  Group Topic/Focus:  Wrap-Up Group:   The focus of this group is to help patients review their daily goal of treatment and discuss progress on daily workbooks.  Participation Level:  Active  Participation Quality:  Redirectable  Affect:  Anxious  Cognitive:  Confused  Insight: Improving  Engagement in Group:  Engaged  Modes of Intervention:  Discussion  Additional Comments: Pt articulated that he did not have a goal for today. Pt stated he did talk with staff about his care. Pt conveyed he made a no phone call and reported relationship with his family and support system was the same. Pt reported he took all medications provided and attended all meal services with 100% intake. Pt said his appetite was good today. Pt evaluated his sleep last night as good. Pt verbalized he felt good about himself and rated his overall day an 8 out of 10 on this date. Pt articulated he had no physical pain. Pt denies no auditory or visual hallucinations or thoughts of harming himself or others. Pt said he would alert staff if anything changed. End of Wrap-Up Group progress report     Nicoletta Dress 03/27/2021, 9:10 PM

## 2021-03-27 NOTE — [facility] Group Notes (Signed)
[facility] Group Notes:  (Nursing/MHT/Case Management/Adjunct)  Date:  03/27/2021  Time:  1:02 PM  Type of Therapy:  Group Therapy  Participation Level:  Active  Participation Quality:  Appropriate and Attentive  Affect:  Appropriate  Cognitive:  Alert and Appropriate  Insight:  Appropriate and Improving  Engagement in Group:  Developing/Improving and Improving  Modes of Intervention:  Discussion, Education, and Orientation  Summary of Progress/Problems: pt discussed their goals for the day and how they felt they would achieve these. Pt discussed how they met their goals yesterday. Pt dicussed discharge planning and felt they were improving toward this goal.  Brandon [name] 03/27/2021, 1:02 PM

## 2021-03-27 NOTE — [facility] Group Notes (Signed)
Patient attended and contributed to group 

## 2021-03-27 NOTE — [facility] Group Notes (Signed)
Adult Psychoeducational Group Note  Date:  03/27/2021 Time:  10:54 AM  Group Topic/Focus:  Goals Group:   The focus of this group is to help patients establish daily goals to achieve during treatment and discuss how the patient can incorporate goal setting into their daily lives to aide in recovery.  Participation Level:  Active  Participation Quality:  Appropriate  Affect:  Appropriate  Cognitive:  Appropriate  Insight: Appropriate  Engagement in Group:  Improving  Modes of Intervention:  Education  Additional Comments:    Donell Beers 03/27/2021, 10:54 AM

## 2021-03-27 NOTE — Progress Notes (Addendum)
[facility] MD Progress Note  03/27/2021 2:49 PM Brandon [name]  MRN:  [id number]  Chief Complaint: psychosis  Reason for Admission: Brandon [name] is a 30 y.o. male with medical history significant for MS (followed by Neurology on Ocrevus), HTN, h/o PE on chronic anticoagulation, and schizophrenia who presented to [facility] on 03/04/21 for worsening AH.  The patient is currently on Hospital Day 22.   Chart Review from last 24 hours:  The patient's chart was reviewed and nursing notes were reviewed. The patient's case was discussed in multidisciplinary team meeting. Per nursing, he was seen responding to internal stimuli punching self in the face and talking to him the voices.  Per MAR he was compliant with scheduled medications and he only required Zyprexa PRN X1 dose yesterday.  Information Obtained Today During Patient Interview:   Patient is seen in his room.  Patient states he is doing "good".  He states that he does not hear any voices and denies VH.  When asked about if he still has an inbuilt phone in his body patient states yes he has but he cannot show it to me as it is inside his body.  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  He denies receiving messages or signals through TV.  He denies thought broadcasting.  He denies any paranoia.  He has been eating and drinking well.  He denies any incontinence episode last night.  He denies any constipation or drooling.  He denies any pain or burning during urination.  He states that he had bowel movement this morning.  He slept well last night. He denies abdominal pain, N/V/D/C.  He denies SI, HI.  He denies having any headache, nausea, vomiting, dizziness, chest pain, shortness of breath, abdominal pain, diarrhea, and constipation.  Patient is alert and oriented x3.  He is able to tell me days of the week backwards.  He denies any  concerns.  Diagnosis: Principal Problem:   Schizophrenia, chronic with acute  exacerbation (HCC) Active Problems:   Obesity, Class III, BMI 40-49.9 (morbid obesity) (HCC)   Unsteady gait   Multiple sclerosis (HCC)   Schizophrenia, disorganized type (HCC)  Total Time Spent in Direct Patient Care: I personally spent 30 minutes on the unit in direct patient care. The direct patient care time included face-to-face time with the patient, reviewing the patient's chart, communicating with other professionals, and coordinating care. Greater than 50% of this time was spent in counseling or coordinating care with the patient regarding goals of hospitalization, psycho-education, and discharge planning needs.   Past Psychiatric History: See H&P  Past Medical History:  Past Medical History:  Diagnosis Date   ADHD (attention deficit hyperactivity disorder)    Bipolar 1 disorder (HCC)    Chronic back pain    Chronic constipation    Chronic neck pain    Hypertension    Multiple sclerosis (HCC) 05/20/2013   left sided weakness, dysarthria   Non-compliance    Obesity    Pulmonary embolism (HCC)    Schizophrenia (HCC)    Stroke (HCC)    left sided deficits - pt's mother denies this    Past Surgical History:  Procedure Laterality Date   NO PAST SURGERIES     None     RADIOLOGY WITH ANESTHESIA N/A 01/16/2021   Procedure: MRI WITH ANESTHESIA CERVICAL AND THORASIC SPINE WITH AND WITHOUT CONTRAST;  Surgeon: Radiologist, Medication, MD;  Location: [facility] OR;  Service:  Radiology;  Laterality: N/A;   TOOTH EXTRACTION N/A 06/24/2019   Procedure: DENTAL RESTORATION/EXTRACTION OF TEETH NUMBER ONE, SIXTEEN, SEVENTEEN, NINETEEN, THIRTY-TWO;  Surgeon: Ocie Doyne, DDS;  Location: [facility] OR;  Service: Oral Surgery;  Laterality: N/A;   Family History:  Family History  Problem Relation Age of Onset   Diabetes Mother    ADD / ADHD Brother    Family Psychiatric  History: See H&P  Social History:  Social History   Substance and Sexual Activity  Alcohol Use Not Currently   Alcohol/week:  0.0 standard drinks   Comment: "A little bit"      Social History   Substance and Sexual Activity  Drug Use Not Currently   Types: Marijuana   Comment: Last used: unknown     Social History   Socioeconomic History   Marital status: Single    Spouse name: Not on file   Number of children: 0   Years of education: 11th   Highest education level: Not on file  Occupational History   Occupation: unemployed    Employer: [employer]    Comment: Disbaled  Tobacco Use   Smoking status: Every Day    Packs/day: 0.25    Types: Cigarettes   Smokeless tobacco: Never   Tobacco comments:    2 cigarettes a day  Vaping Use   Vaping Use: Never used  Substance and Sexual Activity   Alcohol use: Not Currently    Alcohol/week: 0.0 standard drinks    Comment: "A little bit"    Drug use: Not Currently    Types: Marijuana    Comment: Last used: unknown    Sexual activity: Not on file  Other Topics Concern   Not on file  Social History Narrative   Patient lives at home with his mother.   Disabled.   Education 11 th grade .   Right handed.   Drinks caffeine occassionally   Social Determinants of Corporate investment banker Strain: Not on file  Food Insecurity: Not on file  Transportation Needs: Not on file  Physical Activity: Not on file  Stress: Not on file  Social Connections: Not on file    Sleep: Good  Appetite:  Good  Current Medications: Current Facility-Administered Medications  Medication Dose Route Frequency Provider Last Rate Last Admin   acetaminophen (TYLENOL) tablet 650 mg  650 mg Oral Q6H PRN Rankin, Shuvon B, NP   650 mg at 03/25/21 0828   alum & mag hydroxide-simeth (MAALOX/MYLANTA) 200-200-20 MG/5ML suspension 30 mL  30 mL Oral Q4H PRN Rankin, Shuvon B, NP   30 mL at 03/14/21 1651   amLODipine (NORVASC) tablet 5 mg  5 mg Oral Daily Nira Conn A, NP   5 mg at 03/27/21 0935   apixaban (ELIQUIS) tablet 5 mg  5 mg Oral BID Nira Conn A, NP   5 mg at  03/27/21 0930   ARIPiprazole (ABILIFY) tablet 15 mg  15 mg Oral Daily Singleton, Amy E, MD   15 mg at 03/27/21 0930   [START ON 03/28/2021] cloZAPine (CLOZARIL) tablet 100 mg  100 mg Oral q morning Doda, Vandana, MD       cloZAPine (CLOZARIL) tablet 150 mg  150 mg Oral QHS Doda, Traci Sermon, MD   150 mg at 03/26/21 2038   docusate sodium (COLACE) capsule 400 mg  400 mg Oral QHS Nira Conn A, NP   400 mg at 03/26/21 2038   insulin aspart (novoLOG) injection 0-9 Units  0-9 Units  Subcutaneous TID WC Mason Jim, Amy E, MD   2 Units at 03/27/21 1221   latanoprost (XALATAN) 0.005 % ophthalmic solution 1 drop  1 drop Both Eyes QHS Nira Conn A, NP   1 drop at 03/26/21 2040   loratadine (CLARITIN) tablet 10 mg  10 mg Oral Daily Nira Conn A, NP   10 mg at 03/27/21 7628   LORazepam (ATIVAN) tablet 0.5 mg  0.5 mg Oral BID Karsten Ro, MD   0.5 mg at 03/27/21 0930   magnesium hydroxide (MILK OF MAGNESIA) suspension 30 mL  30 mL Oral Daily PRN Rankin, Shuvon B, NP       metoprolol succinate (TOPROL-XL) 24 hr tablet 25 mg  25 mg Oral Daily Mariel Craft, MD   25 mg at 03/27/21 0935   multivitamin with minerals tablet 1 tablet  1 tablet Oral Daily Nira Conn A, NP   1 tablet at 03/27/21 3151   nicotine (NICODERM CQ - dosed in mg/24 hours) patch 14 mg  14 mg Transdermal Daily Nira Conn A, NP   14 mg at 03/27/21 0930   OLANZapine zydis (ZYPREXA) disintegrating tablet 10 mg  10 mg Oral Q8H PRN Comer Locket, MD   10 mg at 03/27/21 1214   And   ziprasidone (GEODON) injection 20 mg  20 mg Intramuscular PRN Comer Locket, MD       topiramate (TOPAMAX) tablet 25 mg  25 mg Oral BID Nira Conn A, NP   25 mg at 03/27/21 0930    Lab Results:  Results for orders placed or performed during the hospital encounter of 03/05/21 (from the past 48 hour(s))  Glucose, capillary     Status: None   Collection Time: 03/25/21  5:15 PM  Result Value Ref Range   Glucose-Capillary 83 70 - 99 mg/dL    Comment:  Glucose reference range applies only to samples taken after fasting for at least 8 hours.  Glucose, capillary     Status: Abnormal   Collection Time: 03/26/21  6:23 AM  Result Value Ref Range   Glucose-Capillary 112 (H) 70 - 99 mg/dL    Comment: Glucose reference range applies only to samples taken after fasting for at least 8 hours.  Glucose, capillary     Status: Abnormal   Collection Time: 03/26/21 11:56 AM  Result Value Ref Range   Glucose-Capillary 148 (H) 70 - 99 mg/dL    Comment: Glucose reference range applies only to samples taken after fasting for at least 8 hours.  Glucose, capillary     Status: Abnormal   Collection Time: 03/26/21  4:50 PM  Result Value Ref Range   Glucose-Capillary 138 (H) 70 - 99 mg/dL    Comment: Glucose reference range applies only to samples taken after fasting for at least 8 hours.  Glucose, capillary     Status: Abnormal   Collection Time: 03/27/21  5:08 AM  Result Value Ref Range   Glucose-Capillary 114 (H) 70 - 99 mg/dL    Comment: Glucose reference range applies only to samples taken after fasting for at least 8 hours.  Glucose, capillary     Status: Abnormal   Collection Time: 03/27/21 12:16 PM  Result Value Ref Range   Glucose-Capillary 180 (H) 70 - 99 mg/dL    Comment: Glucose reference range applies only to samples taken after fasting for at least 8 hours.    Blood Alcohol level:  Lab Results  Component Value Date   ETH <10 03/04/2021  ETH <10 09/13/2020    Metabolic Disorder Labs: Lab Results  Component Value Date   HGBA1C 5.5 03/07/2021   MPG 111.15 03/07/2021   MPG 123 04/30/2020   Lab Results  Component Value Date   PROLACTIN 13.6 04/30/2020   Lab Results  Component Value Date   CHOL 151 03/07/2021   TRIG 154 (H) 03/07/2021   HDL 32 (L) 03/07/2021   CHOLHDL 4.7 03/07/2021   VLDL 31 03/07/2021   LDLCALC 88 03/07/2021   LDLCALC 77 04/30/2020    Physical Findings:  Musculoskeletal: Strength & Muscle  Tone:untested Gait & Station: wide-based/unsteady- ambulates independently Patient leans: N/A  Psychiatric Specialty Exam:  Presentation  General Appearance: In hospital gown; improved hygiene in clean scrubs- no longer malodorous Eye Contact:Fair Speech:Dysarthric, mumbling quality  Speech Volume:Normal Handedness:Right   Mood and Affect  Mood: appears aloof  Affect:constricted - less guarded   Thought Process  Thought Processes: concrete, superficially goal directed and linear Descriptions of Associations:Intact  Orientation: oriented to self, month, date, year and city  Thought Content: Denies AVH and denies paranoia or first rank symptoms but endorses ideas of reference and delusion that astronaut's are 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  He also thinks that he has a built-in phone in his body through which he can contact his family.  History of Schizophrenia/Schizoaffective disorder:Yes  Duration of Psychotic Symptoms:Greater than six months  Hallucinations:Hallucinations: Denies  Suicidal Thoughts:Denied  Homicidal Thoughts:Denied   Sensorium  Memory:Fair  Judgment:Fair - compliant with meds  Insight:lacking   Executive Functions  Concentration:Fair  Attention Span:Fair  Recall:Untested today  Fund of Knowledge:Fair  Language:Fair   Psychomotor Activity  Psychomotor Activity: Normal - no acute agitation or restlessness no akathisias   Assets  Assets:Communication Skills; Desire for Improvement; Social Support   Sleep  Sleep: 5.5 hours  Physical Exam: Physical Exam Vitals and nursing note reviewed.  Constitutional:      Appearance: He is obese.  HENT:     Head: Normocephalic and atraumatic.  Pulmonary:     Effort: Pulmonary effort is normal.  Neurological:     Mental Status: He is alert and oriented to person, place, and time.   Review of Systems  Respiratory:  Negative for cough.   Cardiovascular:  Negative for  chest pain.  Gastrointestinal:  Negative for constipation, diarrhea, nausea and vomiting.  Genitourinary:  Negative for dysuria, frequency and urgency.       Denies urinary incontinence.  Neurological:  Negative for dizziness, tremors, weakness and headaches.  Psychiatric/Behavioral:  Negative for depression, hallucinations and suicidal ideas.   Blood pressure (!) 147/115, pulse (!) 120, temperature 98.3 F (36.8 C), temperature source Oral, resp. rate 18, height 5\' 11"  (1.803 m), weight (!) 145.2 kg, SpO2 100 %. Body mass index is 44.63 kg/m.   Treatment Plan Summary: Daily contact with patient to assess and evaluate symptoms and progress in treatment and Medication management  Brandon [name] is a 30 yr old male who presents under IVC for acute psychosis in setting of treatment resistance (mother verifies pill swallows daily). Had been on 2 antipsychotics (last haldol LAI dispensed early August, saphris 10 TID). In review of chart, has typically not responded well to most antipsychotics with documented compliance. He was previously on clozapine 2-3 years ago with good response, stopped d/t drooling per pt and mother. We discussed restarting this with his mother and pt who provided oral consent/assent to restart clozapine. Patient has discontinued saphris given it has  been ineffective and he has a vulnerable brain d/t MS and prior stroke. Patient has been clinically improving with dose titration up on Clozaril but has developed urinary incontinence, so there was concern for Clozaril being the culprit. His incontinence has improved with start of Abilify. No incontinence episodes since Abilify increased to 15.  Schizophrenia, Chronic with acute exacerbation.  -Continue  Abilify 15 mg daily to help with psychosis and urinary incontinence. -Continue Clozaril 150 mg QHS and increase to 100 mg in morning.  - Will hold Ativan at 0.5mg  bid given recent agitation and attempt tiration off this med slowly over  time - no longer having signs of catatonia - Monitoring weekly CBC, CRP and Troponin with Clozaril titration  Troponin 3 (9/15), 7 (9/8), 4 (9/3)  CRP 2.6 (9/15), 1.9 (9/3), 6.9 (07/20/20)  CBC/ANC 8.09/5198 (9/14), 8.02/5599 (9/8) 7.08/4898 (9/2)  Clozaril level 141 (9/12).  We will recheck, ordered.  Checking orthostatic vitals and monitoring for drooling or constipation - Last EKG  03/16/2021 is NSR, QTC interval 420. Repeat EKG (9/18) shows sinus tachycardia with QTC 426. - Monitoring orthostatic vitals with dose titration on Clozaril  New onset urinary incontinence - no urinary incontinence episodes reported by NS since Abilify changed to 15 mg. Nursing order- Behavior modification to avoid urinary incontinence.  -Voiding before bedtime.  -Remind him to void at least 3-4 times in a day.  -Avoid fluids before sleep -Continue Abilify 15 mg daily.  Mom gave consent to increase Abilify. -UA shows trace leukocytes and rare bacteria. Urine culture shows multiple species present suggested recollection.  Ordered  Catatonia - resolved -Reduce Ativan to 0.5 mg nightly from tomorrow (patient already got one-time dose for today).  - Topamax (incidentally on for wt loss) also tx catatonia, clozapine has data in catatonia as well.    Nicotine Dependence: -Continue Nicotine Patch 14 mg daily   HTN: -Continue Amlodipine 5 mg daily. -Continue Toprol XL 25mg  daily.   H/o PE -Continue Eliquis 5 mg BID   Chronic Constipation -Continue Colace 400 mg QHS   Obesity: -Continue Topamax 25 mg BID as written by PCP  Hypoglycemia - Held Metformin and will monitor FSBS prior to meals with SSI coverage   MS/gait disorder: - Reportedly getting Ocrevus by neurology was due on 9/8. - Infusion for MS got cancelled due to inpatient /outpatient reimbursement issues. Mom informed and ok for him to get it after discharge.  - Home Health OT at D/c.   Other home meds: -Continue Xalatan 0.005% 1 drop both  eyes QHS -Continue Claritin 10 mg daily   -DO NOT START TRAZODONE OR GABAPENTIN AS IT WAS STOPPED BY NEUROLOGY DUE TO GAIT INSTABILITY.   -Continue PRN's: Tylenol, Maalox, Atarax, Milk of Magnesia Disposition-1 to 2 days.  Patient will likely go back to mom's house until he gets placed at permanent group home.  11/8, MD , PGY2 03/27/2021, 2:49 PM

## 2021-03-27 NOTE — Progress Notes (Signed)
Pt denies SI/HI/AVH and verbally agrees to approach staff if these become apparent or before harming themselves/others. Rates depression 0/10. Rates anxiety 0/10. Rates pain 0/10. Pt would sing gibberish at random moments, stating "I love this song." Pt also would not respond, somewhat freeze in place, to RN speaking to him at one moment and proceeded to laugh loudly. Pt stated his nicotine patch was on his right arm and proceeded to state that it was on his left arm. Pt is very agitated at times, RN gave Zyprexa PRN per MD order. Pt will yell as if he is yelling to someone that is not there. Mood is up and down and will laugh at random times. Pt stated "I am going to go to [employer] and write a book about my schizophrenia and how I got my thoughts together." Pt proceeded to yell in the room when we left.Scheduled medications administered to Pt, per MD orders. RN provided support and encouragement to Pt. 15 min safety checks implemented and continued. Pt safe on the unit. RN will continue to monitor and intervene as needed.    03/27/21 0930  Psych Admission Type (Psych Patients Only)  Admission Status Involuntary  Psychosocial Assessment  Patient Complaints Suspiciousness;Anxiety  Eye Contact Brief  Facial Expression Flat  Affect Preoccupied;Anxious  Speech Soft;Rapid;Word salad (incoherent at times)  Interaction Minimal;Guarded  Motor Activity Slow;Restless  Appearance/Hygiene In hospital gown  Behavior Characteristics Anxious;Cooperative;Agitated  Mood Anxious;Preoccupied;Labile  Thought Process  Coherency Disorganized;Circumstantial  Content Preoccupation;Delusions  Delusions Paranoid  Perception Hallucinations  Hallucination Auditory  Judgment Poor  Confusion Mild  Danger to Self  Current suicidal ideation? Denies  Danger to Others  Danger to Others None reported or observed

## 2021-03-27 NOTE — Progress Notes (Signed)
Pt given HS medications. Pt in room talking to people not seen by staff. Pt given PRN Zyprexa per [facility]    03/27/21 2100  Psych Admission Type (Psych Patients Only)  Admission Status Involuntary  Psychosocial Assessment  Patient Complaints Anxiety;Suspiciousness  Eye Contact Brief  Facial Expression Flat  Affect Preoccupied;Anxious  Speech Soft;Rapid;Word salad (incoherent at times)  Interaction Minimal;Guarded  Motor Activity Slow;Restless  Appearance/Hygiene In hospital gown  Behavior Characteristics Anxious  Mood Preoccupied;Suspicious;Pleasant  Thought Process  Coherency Disorganized;Circumstantial  Content Preoccupation;Delusions  Delusions Paranoid  Perception Hallucinations  Hallucination Auditory  Judgment Poor  Confusion Mild  Danger to Self  Current suicidal ideation? Denies  Danger to Others  Danger to Others None reported or observed

## 2021-03-28 LAB — GLUCOSE, CAPILLARY
Glucose-Capillary: 137 mg/dL — ABNORMAL HIGH (ref 70–99)
Glucose-Capillary: 163 mg/dL — ABNORMAL HIGH (ref 70–99)

## 2021-03-28 MED ORDER — ARIPIPRAZOLE 15 MG PO TABS: 15.0000 mg | ORAL_TABLET | Freq: Every day | ORAL | 0 refills | Status: DC

## 2021-03-28 MED ORDER — CLOZAPINE 50 MG PO TABS: 150.0000 mg | ORAL_TABLET | Freq: Every day | ORAL | 0 refills | Status: DC

## 2021-03-28 MED ORDER — NICOTINE 14 MG/24HR TD PT24: 14.0000 mg | MEDICATED_PATCH | Freq: Every day | TRANSDERMAL | 0 refills | Status: DC

## 2021-03-28 MED ORDER — CLOZAPINE 100 MG PO TABS: 100.0000 mg | ORAL_TABLET | Freq: Every morning | ORAL | 0 refills | Status: DC

## 2021-03-28 MED ORDER — METOPROLOL SUCCINATE ER 25 MG PO TB24: 25.0000 mg | ORAL_TABLET | Freq: Every day | ORAL | 0 refills | Status: DC

## 2021-03-28 MED ORDER — LORAZEPAM 0.5 MG PO TABS: 0.5000 mg | ORAL_TABLET | Freq: Every day | ORAL | 0 refills | Status: AC

## 2021-03-28 NOTE — [facility] Group Notes (Signed)
Adult Psychoeducational Group Note  Date:  03/28/2021 Time:  10:31 AM  Group Topic/Focus:  Goals Group:   The focus of this group is to help patients establish daily goals to achieve during treatment and discuss how the patient can incorporate goal setting into their daily lives to aide in recovery.  Participation Level:  Active  Participation Quality:  Appropriate  Affect:  Appropriate  Cognitive:  Appropriate  Insight: Appropriate  Engagement in Group:  Engaged  Modes of Intervention:  Discussion  Additional Comments:    Brandon [name] 03/28/2021, 10:31 AM

## 2021-03-28 NOTE — Discharge Summary (Addendum)
Physician Discharge Summary Note  Patient:  Brandon [name] is an 30 y.o., male MRN:  [id number] DOB:  01-27-1991 Patient phone:  [phone] (home)  Patient address:   [address] [place] [address],  Total Time spent with patient: 20 minutes  Date of Admission:  03/05/2021 Date of Discharge: 03/28/21  Reason for Admission:   Disorganized Schizophrenia.  H&P (03/06/21)- Brandon [name] is a 30 yr old male who presents under IVC for acute psychosis. 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   On exam today patient is very sedated and disorganized.  While attempting to interview him he would suddenly unfocused from the conversation staring straight ahead.  He reported that he was listening to the voices.  He was able to answer that he has no SI, HI, or visual hallucinations.  When asked to elaborate on the auditory hallucinations he was unable to.  Per chart review it is known that he is a patient of Brandon [name] at the [facility] and that he was recently switched from Haldol oral and LAI to Saphris.  Patient does not know when or why this happened at this point in time.  Patient is able to tell us that his mother is his legal guardian and that he did have an ACT team and that he does have MS.  He was able to tell us he goes to the [facility] day program during the week. However, at this point we were unable to get him to answer any other questions and so had to obtain the rest of this HPI from chart review.  We will plan to reach out to his mother later today or tomorrow for more information.  Per ED Psych Provider Note- "Brandon [name], 30 y.o., male patient seen face to face by this provider, consulted with Brandon [name]; and chart reviewed on 03/04/21.  On evaluation SHAH [name] reports he doesn't know why he was brought here.  States he was brought in by the police for "no reason."  Patient denies suicidal/homicidal  ideation.  Patient asked if he felt he had a device in his skin and he stated "yes" and pinched at his arm and then his leg stating it was in his leg.  Patient then turned his head to the side and started laughing and talking to someone that was not there.  Patient asked who he was talking to and he stated "Brandon [name]."    Patient asked who Brandon [name] was and he stated it was his father.  Asked if he could see Brandon [name] and he did not respond.  Patient reports that he lives with his mother.  Patient also reports that he has been prescribed medications that he do not take because he doesn't have schizophrenia.  Patient states that he smokes "Loud" marijuana to help with his voices and the last time he smoked was 3 or 4 weeks ago.  States when he doesn't smoke the voices come back.  Currently patient denies auditory and visual hallucinations but he is seen looking and talking to someone that is not there.  Patient is not a good historian and he was brought in by [facility] police. "   According to notes from March 2022 visit with Brandon [name]: Patient had Haldol dec dose increased to 300mg  from 200mg  in March and was continued on Haldol 10mg  bid; He was on Saphris 10mg  tid, Cogentin 1mg  bid, and Trazodone 100mg  -200mg  qhs for sleep.  Principal Problem: Schizophrenia, chronic with acute exacerbation [facility]) Discharge Diagnoses: Principal Problem:   Schizophrenia, chronic with acute exacerbation (HCC) Active Problems:   Obesity, Class III, BMI 40-49.9 (morbid obesity) (HCC)   Unsteady gait   Multiple sclerosis (HCC)   Schizophrenia, disorganized type (HCC)   Past Psychiatric History: Disorganized Schizophrenia, recently stopped Haldol PO and LAI and started on Saphris. Past meds tried per outpatient notes: Risperidone, Invega, Abilify, Olanzapine, Clozapine (in 2019), Doxepin, Trazodone.  Past Medical History:  Past Medical History:  Diagnosis Date   ADHD (attention deficit hyperactivity disorder)     Bipolar 1 disorder (HCC)    Chronic back pain    Chronic constipation    Chronic neck pain    Hypertension    Multiple sclerosis (HCC) 05/20/2013   left sided weakness, dysarthria   Non-compliance    Obesity    Pulmonary embolism (HCC)    Schizophrenia (HCC)    Stroke (HCC)    left sided deficits - pt's mother denies this    Past Surgical History:  Procedure Laterality Date   NO PAST SURGERIES     None     RADIOLOGY WITH ANESTHESIA N/A 01/16/2021   Procedure: MRI WITH ANESTHESIA CERVICAL AND THORASIC SPINE WITH AND WITHOUT CONTRAST;  Surgeon: Radiologist, Medication, MD;  Location: [facility] OR;  Service: Radiology;  Laterality: N/A;   TOOTH EXTRACTION N/A 06/24/2019   Procedure: DENTAL RESTORATION/EXTRACTION OF TEETH NUMBER ONE, SIXTEEN, SEVENTEEN, NINETEEN, THIRTY-TWO;  Surgeon: Ocie Doyne, DDS;  Location: [facility] OR;  Service: Oral Surgery;  Laterality: N/A;   Family History:  Family History  Problem Relation Age of Onset   Diabetes Mother    ADD / ADHD Brother    Family Psychiatric  History:  ADD / ADHD Brother    Social History:  Social History   Substance and Sexual Activity  Alcohol Use Not Currently   Alcohol/week: 0.0 standard drinks   Comment: "A little bit"      Social History   Substance and Sexual Activity  Drug Use Not Currently   Types: Marijuana   Comment: Last used: unknown     Social History   Socioeconomic History   Marital status: Single    Spouse name: Not on file   Number of children: 0   Years of education: 11th   Highest education level: Not on file  Occupational History   Occupation: unemployed    Employer: [employer] teacher [employer]    Comment: Disbaled  Tobacco Use   Smoking status: Every Day    Packs/day: 0.25    Types: Cigarettes   Smokeless tobacco: Never   Tobacco comments:    2 cigarettes a day  Vaping Use   Vaping Use: Never used  Substance and Sexual Activity   Alcohol use: Not Currently    Alcohol/week: 0.0 standard drinks     Comment: "A little bit"    Drug use: Not Currently    Types: Marijuana    Comment: Last used: unknown    Sexual activity: Not on file  Other Topics Concern   Not on file  Social History Narrative   Patient lives at home with his mother.   Disabled.   Education 11 th grade .   Right handed.   Drinks caffeine occassionally   Social Determinants of Corporate investment banker Strain: Not on file  Food Insecurity: Not on file  Transportation Needs: Not on file  Physical Activity: Not on file  Stress: Not on file  Social  Connections: Not on file    Hospital Course: Mr. Schlotter is a 30 yr old male who presents under IVC for acute psychosis in setting of treatment resistance (mother verifies pill swallows daily). Pt had been on 2 antipsychotics (last haldol LAI dispensed early August, saphris 10 TID).  Pt was initially continued on home medications including Amantadine 100 mg twice daily, reduced Saphris to 10 mg twice daily due to oversedation, Klonopin 0.5 mg daily as needed.  In review of chart, it was found that patient had typically not responded well to most antipsychotics with documented compliance. He was previously on clozapine 2-3 years ago with good response, which was stopped d/t drooling per pt and mother. We discussed restarting this with his mother and pt who provided oral consent/assent to restart clozapine. We planned to slowly tapered him off of saphris given it has been ineffective and he has a vulnerable brain d/t MS and prior stroke with the hope to see him on clozapine as monotherapy (plus medication to ameliorate side effects if necessary).  Patient was catatonic on day 2 of admission with posturing and staring on exam.  He was minimally interactive.  He was started on Ativan 2 mg twice daily which was reduced later due to over sedation to 1 mg and then 0.5 mg twice daily with a final dose of 0.5 mg nightly at discharge to help with sleep and agitation. Patient's other home  medication continued during inpatient admission including amlodipine, Eliquis, Colace, Topamax, Xalatan eyedrops, and Claritin.  Metformin was started initially but was stopped due to hypoglycemic episode. Saphris was reduced to once daily dose. Daytime Clozaril 50 mg was also added to the regime . Saphris PRN was also available for agitation but patient did not require it.  We slowly titrated Clozaril to to a final dose at the time of discharge 150 nightly and 100 mg daily.  Patient reported urinary incontinence to nursing staff.  After discussing with mom, Saphris was stopped and Abilify 10 mg was started for urinary incontinence and psychosis.  Nursing staff still reported few episodes of urinary incontinence.  Abilify was increased to 15 mg daily.  Patient urinary continence improved and nursing staff did not report any more episodes of urinary incontinence.  Patient reportedly was getting Ocrevus by neurology that was due on 9/8.  We contacted neurology to schedule infusion but it got cancelled due to Pharmacy not able to order medication due to reimbursement issues. Mom was informed and she was ok for him to get it after discharge.  Patient received OT during inpatient due to address functional mobility, gait abnormality, and engagement in ADL/iADLs. Pt's mood, sleep, behavior, auditory hallucinations, affect and anxiety improved while his time at the inpatient unit. Pt was seen daily on rounds, and case discussed at multidisciplinary team meeting to ensure biopsychosocial approach to treatment. Pt was educated on need for compliance with his medications. Pt was also educated on need to abstain from use of any drugs. During his stay Patient did not display any suicidal behavior on the unit.  Staff reported disorganization, talking to himself loudly, feeling agitated and need for redirection mostly during evening/nighttime. Patient received as needed antipsychotic medications mostly Zyprexa multiple times  during inpatient admission due to agitation and disorganized behaviors.  Pt participated in some of the group sessions/therapies. Pt's medications were addressed & adjusted to meet his needs. Pt was recommended for outpatient follow-up care & medication management upon discharge to assure his continuity of care.  At the time of discharge, patient is not reporting any acute suicidal/homicidal ideations. Pt reports improved mood, sleep, anxiety, and thought process. Pt currently denies any new issues or concerns. Education and supportive counseling provided throughout her hospital stay & upon discharge. Patient was recommended to follow-up with neurology to schedule Ocrevus infusion as soon as possible.  Patient was recommended for outpatient follow-up with OT.  Informed mom about discharge.  Answered all question and concerns.  Patient is discharged to home.  Physical Findings: AIMS: Facial and Oral Movements Muscles of Facial Expression: None, normal Lips and Perioral Area: None, normal Jaw: None, normal Tongue: None, normal,Extremity Movements Upper (arms, wrists, hands, fingers): None, normal Lower (legs, knees, ankles, toes): None, normal, Trunk Movements Neck, shoulders, hips: None, normal, Overall Severity Severity of abnormal movements (highest score from questions above): None, normal Incapacitation due to abnormal movements: None, normal Patient's awareness of abnormal movements (rate only patient's report): No Awareness, Dental Status Current problems with teeth and/or dentures?: No Does patient usually wear dentures?: No  CIWA:    COWS:     Musculoskeletal: Strength & Muscle Tone: within normal limits Gait & Station: unsteady Patient leans: N/A   Psychiatric Specialty Exam:  Presentation  General Appearance: Disheveled  Eye Contact:Fair  Speech:Slow; Slurred  Speech Volume:Normal  Handedness:Right   Mood and Affect  Mood:Euthymic  Affect:Blunt   Thought  Process  Thought Processes:Disorganized  Descriptions of Associations:Intact  Orientation:Full (Time, Place and Person)  Thought Content:Delusions  History of Schizophrenia/Schizoaffective disorder:Yes  Duration of Psychotic Symptoms:Greater than six months  Hallucinations:Hallucinations: None Description of Auditory Hallucinations: Last night observed by nursing staff responding to internal stimuli, punching self in the face, talking to the voices.  Ideas of Reference:Delusions (Thinks that there is an built phone and him through which he can talk to family.  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  Constitutional:      General: He is not in acute distress.    Appearance: Normal appearance. He is obese. He is not ill-appearing, toxic-appearing or diaphoretic.  HENT:     Head: Normocephalic.  Pulmonary:     Effort: Pulmonary effort is normal.  Neurological:     General: No focal deficit present.     Mental Status: He is alert and oriented to person, place, and time.   Review of Systems  Constitutional:  Negative for chills and fever.  HENT:  Negative for hearing loss.   Eyes:  Negative for blurred vision.  Respiratory:  Negative for cough and shortness of breath.   Cardiovascular:  Negative for chest pain.  Gastrointestinal:  Negative for  heartburn, nausea and vomiting.  Skin:  Negative for rash.  Neurological:  Negative for dizziness, tremors and headaches.  Psychiatric/Behavioral:  Negative for depression, hallucinations and suicidal ideas. The patient is not nervous/anxious and does not have insomnia.   Blood pressure 132/86, pulse 97, temperature 98.2 F (36.8 C), temperature source Oral, resp. rate 18,  height 5\' 11"  (1.803 m), weight (!) 145.2 kg, SpO2 100 %. Body mass index is 44.63 kg/m.   Social History   Tobacco Use  Smoking Status Every Day   Packs/day: 0.25   Types: Cigarettes  Smokeless Tobacco Never  Tobacco Comments   2 cigarettes a day   Tobacco Cessation:  A prescription for an FDA-approved tobacco cessation medication provided at discharge   Blood Alcohol level:  Lab Results  Component Value Date   [facility] <10 03/04/2021   ETH <10 09/13/2020    Metabolic Disorder Labs:  Lab Results  Component Value Date   HGBA1C 5.5 03/07/2021   MPG 111.15 03/07/2021   MPG 123 04/30/2020   Lab Results  Component Value Date   PROLACTIN 13.6 04/30/2020   Lab Results  Component Value Date   CHOL 151 03/07/2021   TRIG 154 (H) 03/07/2021   HDL 32 (L) 03/07/2021   CHOLHDL 4.7 03/07/2021   VLDL 31 03/07/2021   LDLCALC 88 03/07/2021   LDLCALC 77 04/30/2020    See Psychiatric Specialty Exam and Suicide Risk Assessment completed by Attending Physician prior to discharge.  Discharge destination:  Home  Is patient on multiple antipsychotic therapies at discharge:  Yes,   Do you recommend tapering to monotherapy for antipsychotics?   Patient is stable on current regime of Abilify and clozapine.  Abilify can be tapered off at outpatient follow-up.    Has Patient had three or more failed trials of antipsychotic monotherapy by history:  Yes,   Antipsychotic medications that previously failed include:   3.  Haldol, Saphris, risperidone, Invega, olanzapine.  Recommended Plan for Multiple Antipsychotic  Therapies: Additional reason(s) for multiple antispychotic treatment:    Patient has treatment resistant schizophrenia.  He is stable on current regime of clozapine and Abilify.  Abilify can be tapered off at outpatient follow-up.  Discharge Instructions     Diet - low sodium heart healthy   Complete by: As directed    Increase activity slowly   Complete by: As directed       Allergies as of 03/28/2021   No Known Allergies      Medication List     STOP taking these medications    amantadine 100 MG capsule Commonly known as: SYMMETREL   Asenapine Maleate 10 MG Subl   clonazePAM 0.5 MG tablet Commonly known as: KLONOPIN   haloperidol 10 MG tablet Commonly known as: HALDOL   haloperidol decanoate 100 MG/ML injection Commonly known as: HALDOL DECANOATE   metFORMIN 500 MG 24 hr tablet Commonly known as: GLUCOPHAGE-XR       TAKE these medications      Indication  acetaminophen 500 MG tablet Commonly known as: TYLENOL Take 1 tablet (500 mg total) by mouth every 6 (six) hours as needed. What changed: reasons to take this  Indication: Fever, Pain   amLODipine 5 MG tablet Commonly known as: NORVASC Take 5 mg by mouth daily.  Indication: High Blood Pressure Disorder   ARIPiprazole 15 MG tablet Commonly known as: ABILIFY Take 1 tablet (15 mg total) by mouth daily. Start taking on: March 29, 2021  Indication: Agitated Movements Accompanied by Emotional Distress, Schizophrenia   clozapine 50 MG tablet Commonly known as: CLOZARIL Take 3 tablets (150 mg total) by mouth at bedtime.  Indication: Schizophrenia   cloZAPine 100 MG tablet Commonly known as: CLOZARIL Take 1 tablet (100 mg total) by mouth every morning.  Indication: Schizophrenia   docusate sodium 100 MG capsule Commonly known  as: COLACE Take 400 mg by mouth at bedtime.  Indication: Constipation   Eliquis 5 MG Tabs tablet Generic drug: apixaban Take 5 mg by mouth 2 (two) times daily.   Indication: Cerebrovascular Accident or Stroke   latanoprost 0.005 % ophthalmic solution Commonly known as: XALATAN INSTILL 1 DROP INTO BOTH EYES EVERY NIGHT  Indication: Persistently Increased Pressure in the Eye   loratadine 10 MG tablet Commonly known as: CLARITIN Take 10 mg by mouth daily.  Indication: Hayfever   LORazepam 0.5 MG tablet Commonly known as: ATIVAN Take 1 tablet (0.5 mg total) by mouth at bedtime.  Indication: Feeling Anxious, Trouble Sleeping, Agitation   metoprolol succinate 25 MG 24 hr tablet Commonly known as: TOPROL-XL Take 1 tablet (25 mg total) by mouth daily. Start taking on: March 29, 2021  Indication: High Blood Pressure Disorder   multivitamin with minerals Tabs tablet Take 1 tablet by mouth daily.  Indication: fatigue   nicotine 14 mg/24hr patch Commonly known as: NICODERM CQ - dosed in mg/24 hours Place 1 patch (14 mg total) onto the skin daily.  Indication: Nicotine Addiction   ocrelizumab 600 mg in sodium chloride 0.9 % 500 mL Inject 600 mg into the vein every 6 (six) months.  Indication: Multiple Sclerosis   topiramate 25 MG tablet Commonly known as: TOPAMAX TAKE ONE TABLET BY MOUTH TWICE A DAY What changed: See the new instructions.  Indication: Antipsychotic Therapy-Induced Weight Gain        Follow-up Information     [facility]/2021.   Why: You have a hospital follow up appointment for therapy services and medication management services on 04/01/21 at 12:15 pm.  This will be a Virtual appointment (you may call to change to in person). Contact information: [address] [place] [address] [phone]         [facility] Call.   Why: Please contact or go to this provider for continuing PSR services. Contact information: [address] [place], [place] [address]          P: [phone] F: [phone]                Follow-up recommendations:  Activity: As tolerated  Comments:  Prescriptions sent to pharmacy at discharge. Patient agreeable to plan.  Given opportunity to ask questions.  Appears to feel comfortable with discharge denies any current suicidal or homicidal thought. Patient is also instructed prior to discharge to: Take all medications as prescribed by his mental healthcare provider. Report any adverse effects and or reactions from the medicines to his outpatient provider promptly. Patient has been instructed & cautioned: To not engage in alcohol and or illegal drug use while on prescription medicines. In the event of worsening symptoms, patient is instructed to call the crisis hotline, 911 and or go to the nearest ED for appropriate evaluation and treatment of symptoms. To follow-up with his primary care provider for your other medical issues, concerns and or health care needs.   Signed: Karsten Ro, MD 03/28/2021, 3:17 PM

## 2021-03-28 NOTE — BHH Suicide Risk Assessment (Signed)
[facility] Discharge Suicide Risk Assessment   Principal Problem: Schizophrenia, chronic with acute exacerbation (HCC) Discharge Diagnoses: Principal Problem:   Schizophrenia, chronic with acute exacerbation (HCC) Active Problems:   Obesity, Class III, BMI 40-49.9 (morbid obesity) (HCC)   Unsteady gait   Multiple sclerosis (HCC)   Schizophrenia, disorganized type (HCC)   Total Time spent with patient: 20 minutes  Musculoskeletal: Strength & Muscle Tone: abnormal and MS diagnosis Gait & Station: ataxic Patient leans: Left  Psychiatric Specialty Exam  Presentation  General Appearance: Disheveled  Eye Contact:Fair  Speech:Slow; Slurred  Speech Volume:Normal  Handedness:Right   Mood and Affect  Mood:Euthymic  Duration of Depression Symptoms: N/A  Affect:Blunt   Thought Process  Thought Processes:Disorganized  Descriptions of Associations:Intact  Orientation:Full (Time, Place and Person)  Thought Content:Delusions  History of Schizophrenia/Schizoaffective disorder:Yes  Duration of Psychotic Symptoms:Greater than six months  Hallucinations:Hallucinations: None Description of Auditory Hallucinations: Last night observed by nursing staff responding to internal stimuli, punching self in the face, talking to the voices.  Ideas of Reference:Delusions (Thinks that there is an built phone and him through which he can talk to family.  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  Constitutional:      Appearance: He is obese.  HENT:     Head: Normocephalic and atraumatic.     Nose: Nose normal.  Eyes:     Extraocular Movements: Extraocular movements intact.  Cardiovascular:     Rate and Rhythm: Tachycardia present.  Pulmonary:     Effort: Pulmonary effort is normal.  Musculoskeletal:     Cervical back: Normal range of motion.  Neurological:     Mental Status: He is alert. Mental status is at baseline.  Psychiatric:        Attention and Perception: Attention normal.        Mood and Affect: Mood normal. Affect is blunt.        Behavior: Behavior is cooperative.        Thought Content: Thought content is paranoid and delusional.     Comments: Judgement limited (baseline) Insight limited (baseline)   ROS Blood pressure 137/81, pulse (!) 117, temperature 98.2 F (36.8 C), temperature source Oral, resp. rate 18, height 5\' 11"  (1.803 m), weight (!) 145.2 kg, SpO2 100 %. Body mass index is 44.63 kg/m.  Mental Status Per Nursing Assessment::   On Admission:  NA  Demographic Factors:  Male and Unemployed  Loss Factors: Decline in physical health  Historical Factors: Impulsivity  Risk Reduction Factors:   Living with another person, especially a relative and Positive social support  Continued Clinical Symptoms:  Schizophrenia:   Paranoid or undifferentiated type Currently Psychotic Medical Diagnoses and Treatments/Surgeries  Cognitive Features That Contribute To Risk:  Loss of executive function    Suicide Risk:  Minimal: No identifiable suicidal ideation.  Patients presenting with no risk factors but with morbid ruminations; may be classified as minimal risk based on the severity of  the depressive symptoms   Follow-up Information     [facility] on 04/01/2021.   Why: You have a  hospital follow up appointment for therapy services and medication management services on 04/01/21 at 12:15 pm.  This will be a Virtual appointment (you may call to change to in person). Contact information: [address] [place] [address] [phone]         [facility] Call.   Why: Please contact or go to this provider for continuing PSR services. Contact information: [address] [place], [place] [address]          P: [phone] F: [phone]                Plan Of Care/Follow-up recommendations:  Activity:  as tolerated.  Diet:  cardiac, weight loss diet Tests:  weekly CBC monitoring  Roselle Locus, MD 03/28/2021, 10:51 AM

## 2021-03-28 NOTE — BHH Counselor (Signed)
CSW called and spoke with pt's guardian and informed her that pt would be discharged today. Pt's guardian stated that this was fine and asked that we have him transported to her home where her brother will be. CSW stated that she will do so at 2pm. Guardian agreed.   Fredirick Lathe, LCSWA Clinicial Social Worker [employer]

## 2021-03-28 NOTE — Progress Notes (Signed)
Recreation Therapy Notes  INPATIENT RECREATION TR PLAN  Patient Details Name: Brandon [name] MRN: [id number] DOB: 05/01/1991 Today's Date: 03/28/2021  Rec Therapy Plan Is patient appropriate for Therapeutic Recreation?: Yes Treatment times per week: about 3 days Estimated Length of Stay: 5-7 days TR Treatment/Interventions: Group participation (Comment)  Discharge Criteria Pt will be discharged from therapy if:: Discharged Treatment plan/goals/alternatives discussed and agreed upon by:: Patient/family  Discharge Summary Short term goals set: See patient care plan Short term goals met: Complete Progress toward goals comments: Groups attended Which groups?: Self-esteem, Coping skills, Wellness, Other (Comment) (Anxiety; Team Building) Reason goals not met: None Therapeutic equipment acquired: N/A Reason patient discharged from therapy: Discharge from hospital Pt/family agrees with progress & goals achieved: Yes Date patient discharged from therapy: 03/28/21     , LRT/CTRS   ,  A 03/28/2021, 12:35 PM 

## 2021-03-28 NOTE — Group Note (Signed)
Recreation Therapy Group Note   Group Topic:Coping Skills  Group Date: 03/28/2021 Start Time: 1000 End Time: 1025 Facilitators: Caroll Rancher, LRT/CTRS Location: [address] Dayroom  Goal Area(s) Addresses: Patient will define what a coping skill is. Patient will create a list of healthy coping skills beginning with each letter of the alphabet. Patient will successfully identify positive coping skills they can use post d/c.  Patient will acknowledge benefit(s) of using learned coping skills post d/c.   Group Description:  Coping A to Z. Patient asked to identify what a coping skill is and when they use them. Patients with Clinical research associate discussed healthy versus unhealthy coping skills. Next patients were given a blank worksheet titled "Coping Skills A-Z". Partners were instructed to come up with at least one positive coping skill per letter of the alphabet.  Patients were given 15 minutes to brainstorm and create their list before ideas were presented to the large group. Patients and LRT debriefed on the importance of coping skill selection based on situation and back-up plans when a skill tried is not effective.     Affect/Mood: Appropriate   Participation Level: Moderate   Participation Quality: Minimal Cues   Behavior: Hallucinating   Speech/Thought Process: Barely audible  and Relevant   Insight: Fair   Judgement: Fair    Modes of Intervention: Worksheet   Patient Response to Interventions:  Attentive   Education Outcome:  Acknowledges education and In group clarification offered    Clinical Observations/Individualized Feedback: Pt was able to identify some coping skills.  Pt identified bouncing a ball, correct failures, strive to do better things, dictate thoughts and eat healthy as positive coping skills.  Pt did have moments were he would have outburst talking to someone not there. Pt needed redirection to focus on activity.     Plan: Continue to engage patient in RT group  sessions 2-3x/week.   Caroll Rancher, LRT/CTRS 03/28/2021 12:29 PM

## 2021-03-28 NOTE — Progress Notes (Signed)
D: Pt A & O X 3. Denies SI, HI, AVH and pain at this time. D/C home as ordered. Transported home by [employer]. A: D/C instructions reviewed with pt including prescriptions and follow up appointments; compliance encouraged. All belongings from locker 46 returned to pt at time of departure. Scheduled and PRN medications given with verbal education and effects monitored. Safety checks maintained without incident till time of d/c.  R: Pt receptive to care. Compliant with medications when offered. Denies adverse drug reactions when assessed. Verbalized understanding related to d/c instructions. Signed belonging sheet in agreement with items received from locker. Ambulatory in milieu with care; gait is slow and unsteady. Appears to be in no physical distress at time of departure.

## 2021-03-28 NOTE — Plan of Care (Signed)
Patient identified positive coping skills to decrease depression at completion of recreation therapy group sessions.   Caroll Rancher, LRT/CTRS

## 2021-03-28 NOTE — Progress Notes (Signed)
  [facility] Adult Case Management Discharge Plan :  Will you be returning to the same living situation after discharge:  Yes,  with mother/guardain At discharge, do you have transportation home?: Yes,  Safe Transport, approved by guardain/mother Do you have the ability to pay for your medications: Yes,  Medicaid/Medicare  Release of information consent forms completed and in the chart;  Patient's signature needed at discharge.  Patient to Follow up at:  Follow-up Information     [facility] Follow up on 04/01/2021.   Why: You have a hospital follow up appointment for therapy services and medication management services on 04/01/21 at 12:15 pm.  This will be a Virtual appointment (you may call to change to in person). Contact information: [address] [place] [address] [phone]         [facility] Call.   Why: Please contact or go to this provider for continuing PSR services. Contact information: [address] [place], [place] [address]          P: [phone] F: [phone]                Next level of care provider has access to [facility] Link:no  Safety Planning and Suicide Prevention discussed: Yes,  with mother and guardain     Has patient been referred to the Quitline?: Patient refused referral  Patient has been referred for addiction treatment: N/A  Felizardo Hoffmann, Theresia Majors 03/28/2021, 10:29 AM

## 2021-03-29 ENCOUNTER — Other Ambulatory Visit (HOSPITAL_COMMUNITY): Payer: Self-pay

## 2021-04-01 ENCOUNTER — Other Ambulatory Visit (HOSPITAL_COMMUNITY): Payer: Self-pay

## 2021-04-01 ENCOUNTER — Telehealth: Payer: Self-pay | Admitting: Neurology

## 2021-04-01 LAB — CLOZAPINE (CLOZARIL)
Clozapine Lvl: 195 ng/mL — ABNORMAL LOW (ref 350–650)
NorClozapine: 147 ng/mL
Total(Cloz+Norcloz): 342 ng/mL

## 2021-04-01 MED ORDER — SODIUM CHLORIDE 0.9 % IV SOLN: 600.0000 mg | INTRAVENOUS | 0 refills | Status: DC

## 2021-04-01 NOTE — Telephone Encounter (Signed)
Sandy from [facility] called stating they are needing an actual prescription for pt's OCREVUS infusion. Requesting a call back.

## 2021-04-01 NOTE — Telephone Encounter (Signed)
Rx printed for provider to sign. 

## 2021-04-02 ENCOUNTER — Other Ambulatory Visit (HOSPITAL_COMMUNITY): Payer: Self-pay

## 2021-04-02 MED ORDER — OCRELIZUMAB 300 MG/10ML IV SOLN
INTRAVENOUS | 0 refills | Status: DC
  Filled 2021-04-02: qty 20, 90d supply, fill #0

## 2021-04-02 NOTE — Telephone Encounter (Signed)
Rx has been signed and sent to [facility] pharmacy.

## 2021-04-04 ENCOUNTER — Encounter (HOSPITAL_COMMUNITY): Payer: Medicare Other

## 2021-04-08 ENCOUNTER — Non-Acute Institutional Stay (HOSPITAL_COMMUNITY)
Admission: RE | Admit: 2021-04-08 | Discharge: 2021-04-08 | Disposition: A | Discharge status: Home / Self Care | Payer: Medicare Other | Source: Ambulatory Visit | Attending: Internal Medicine | Admitting: Internal Medicine

## 2021-04-08 ENCOUNTER — Other Ambulatory Visit: Payer: Self-pay

## 2021-04-08 DIAGNOSIS — G35 Multiple sclerosis: Secondary | ICD-10-CM | POA: Diagnosis present

## 2021-04-08 MED ORDER — SODIUM CHLORIDE 0.9 % IV SOLN
INTRAVENOUS | Status: DC | PRN
  Administered 2021-04-08: 250 mL via INTRAVENOUS

## 2021-04-08 MED ORDER — ACETAMINOPHEN 325 MG PO TABS
650.0000 mg | ORAL_TABLET | Freq: Once | ORAL | Status: AC
  Administered 2021-04-08: 650 mg via ORAL
  Filled 2021-04-08: qty 2

## 2021-04-08 MED ORDER — METHYLPREDNISOLONE SODIUM SUCC 125 MG IJ SOLR
100.0000 mg | Freq: Once | INTRAMUSCULAR | Status: AC
  Administered 2021-04-08: 100 mg via INTRAVENOUS
  Filled 2021-04-08: qty 2

## 2021-04-08 MED ORDER — DIPHENHYDRAMINE HCL 50 MG/ML IJ SOLN
25.0000 mg | Freq: Once | INTRAMUSCULAR | Status: AC
  Administered 2021-04-08: 25 mg via INTRAVENOUS
  Filled 2021-04-08: qty 1

## 2021-04-08 MED ORDER — SODIUM CHLORIDE 0.9 % IV SOLN
600.0000 mg | Freq: Once | INTRAVENOUS | Status: AC
  Administered 2021-04-08: 600 mg via INTRAVENOUS
  Filled 2021-04-08: qty 20

## 2021-04-08 NOTE — Progress Notes (Signed)
PATIENT CARE CENTER NOTE    Diagnosis: Multiple Sclerosis      Provider: Stephanie Acre, MD     Procedure: Ocrevus 600 mg IV      Note: Patient received Ocrevus infusion via PIV. Pre-medications given per order and infusion titrated per protocol. Patient tolerated infusion well.  Mother at bedside throughout infusion. BP elevated throughout infusion which is patient's baseline. Discharge instructions given. Patient alert, oriented and ambulatory at discharge. Discharged home with mother.

## 2021-04-20 ENCOUNTER — Encounter (HOSPITAL_COMMUNITY): Payer: Self-pay | Admitting: *Deleted

## 2021-04-20 ENCOUNTER — Emergency Department (HOSPITAL_COMMUNITY)
Admission: EM | Admit: 2021-04-20 | Discharge: 2021-04-23 | Disposition: A | Discharge status: Home / Self Care | Payer: Medicare Other | Attending: Emergency Medicine | Admitting: Emergency Medicine

## 2021-04-20 ENCOUNTER — Emergency Department (HOSPITAL_COMMUNITY): Payer: Medicare Other

## 2021-04-20 ENCOUNTER — Other Ambulatory Visit: Payer: Self-pay

## 2021-04-20 DIAGNOSIS — R0789 Other chest pain: Secondary | ICD-10-CM | POA: Diagnosis not present

## 2021-04-20 DIAGNOSIS — F17210 Nicotine dependence, cigarettes, uncomplicated: Secondary | ICD-10-CM | POA: Insufficient documentation

## 2021-04-20 DIAGNOSIS — F79 Unspecified intellectual disabilities: Secondary | ICD-10-CM | POA: Diagnosis not present

## 2021-04-20 DIAGNOSIS — R45851 Suicidal ideations: Secondary | ICD-10-CM | POA: Diagnosis not present

## 2021-04-20 DIAGNOSIS — I10 Essential (primary) hypertension: Secondary | ICD-10-CM | POA: Insufficient documentation

## 2021-04-20 DIAGNOSIS — F201 Disorganized schizophrenia: Secondary | ICD-10-CM | POA: Insufficient documentation

## 2021-04-20 DIAGNOSIS — E669 Obesity, unspecified: Secondary | ICD-10-CM | POA: Diagnosis not present

## 2021-04-20 DIAGNOSIS — Z7901 Long term (current) use of anticoagulants: Secondary | ICD-10-CM | POA: Diagnosis not present

## 2021-04-20 DIAGNOSIS — Z20822 Contact with and (suspected) exposure to covid-19: Secondary | ICD-10-CM | POA: Insufficient documentation

## 2021-04-20 DIAGNOSIS — Z79899 Other long term (current) drug therapy: Secondary | ICD-10-CM | POA: Insufficient documentation

## 2021-04-20 DIAGNOSIS — R451 Restlessness and agitation: Secondary | ICD-10-CM

## 2021-04-20 DIAGNOSIS — R443 Hallucinations, unspecified: Secondary | ICD-10-CM

## 2021-04-20 DIAGNOSIS — Z046 Encounter for general psychiatric examination, requested by authority: Secondary | ICD-10-CM | POA: Diagnosis present

## 2021-04-20 LAB — CBC WITH DIFFERENTIAL/PLATELET
Abs Immature Granulocytes: 0.04 10*3/uL (ref 0.00–0.07)
Basophils Absolute: 0.1 10*3/uL (ref 0.0–0.1)
Basophils Relative: 1 %
Eosinophils Absolute: 0.2 10*3/uL (ref 0.0–0.5)
Eosinophils Relative: 2 %
HCT: 41.8 % (ref 39.0–52.0)
Hemoglobin: 13 g/dL (ref 13.0–17.0)
Immature Granulocytes: 1 %
Lymphocytes Relative: 23 %
Lymphs Abs: 1.8 10*3/uL (ref 0.7–4.0)
MCH: 25.9 pg — ABNORMAL LOW (ref 26.0–34.0)
MCHC: 31.1 g/dL (ref 30.0–36.0)
MCV: 83.4 fL (ref 80.0–100.0)
Monocytes Absolute: 0.6 10*3/uL (ref 0.1–1.0)
Monocytes Relative: 7 %
Neutro Abs: 5.3 10*3/uL (ref 1.7–7.7)
Neutrophils Relative %: 66 %
Platelets: 225 10*3/uL (ref 150–400)
RBC: 5.01 MIL/uL (ref 4.22–5.81)
RDW: 14.6 % (ref 11.5–15.5)
WBC: 8 10*3/uL (ref 4.0–10.5)
nRBC: 0 % (ref 0.0–0.2)

## 2021-04-20 NOTE — ED Provider Notes (Signed)
Emergency Medicine Provider Triage Evaluation Note  Brandon [name] , a 30 y.o. male  was evaluated in triage.  Pt complains of chest pain.  Very vague in his complaints and not answering a lot of my questions.  He points to center of chest as area of pain.  Review of Systems  Positive: Chest pain Negative: SOB, fever  Physical Exam  BP (!) 121/102 (BP Location: Left Arm)   Pulse 82   Temp 98.5 F (36.9 C) (Oral)   Resp 15   SpO2 99%  Gen:   Awake, no distress   Resp:  Normal effort  MSK:   Moves extremities without difficulty  Other:    Medical Decision Making  Medically screening exam initiated at 10:58 PM.  Appropriate orders placed.  Brandon [name] was informed that the remainder of the evaluation will be completed by another provider, this initial triage assessment does not replace that evaluation, and the importance of remaining in the ED until their evaluation is complete.   Chest pain.  EKG, labs, CXR.   Garlon Hatchet, PA-C 04/20/21 2301    Nira Conn, MD 04/21/21 [phone]

## 2021-04-20 NOTE — ED Triage Notes (Signed)
PT states L chest pain since 8 this evening.

## 2021-04-21 DIAGNOSIS — F201 Disorganized schizophrenia: Secondary | ICD-10-CM | POA: Diagnosis not present

## 2021-04-21 LAB — SALICYLATE LEVEL: Salicylate Lvl: <7 mg/dL — ABNORMAL LOW (ref 7.0–30.0)

## 2021-04-21 LAB — ETHANOL: Alcohol, Ethyl (B): <10 mg/dL (ref <10)

## 2021-04-21 LAB — BASIC METABOLIC PANEL
Anion gap: 8 (ref 5–15)
BUN: 16 mg/dL (ref 6–20)
CO2: 23 mmol/L (ref 22–32)
Calcium: 9.2 mg/dL (ref 8.9–10.3)
Chloride: 107 mmol/L (ref 98–111)
Creatinine, Ser: 1 mg/dL (ref 0.61–1.24)
GFR, Estimated: >60 mL/min (ref >60)
Glucose, Bld: 94 mg/dL (ref 70–99)
Potassium: 3.5 mmol/L (ref 3.5–5.1)
Sodium: 138 mmol/L (ref 135–145)

## 2021-04-21 LAB — TROPONIN I (HIGH SENSITIVITY)
Troponin I (High Sensitivity): 4 ng/L (ref <18)
Troponin I (High Sensitivity): 4 ng/L (ref <18)

## 2021-04-21 LAB — ACETAMINOPHEN LEVEL: Acetaminophen (Tylenol), Serum: <10 ug/mL — ABNORMAL LOW (ref 10–30)

## 2021-04-21 MED ORDER — TRAZODONE HCL 50 MG PO TABS: 50.0000 mg | ORAL_TABLET | Freq: Every evening | ORAL | Status: DC | PRN

## 2021-04-21 MED ORDER — TOPIRAMATE 25 MG PO TABS
25.0000 mg | ORAL_TABLET | Freq: Two times a day (BID) | ORAL | Status: DC
  Administered 2021-04-21 – 2021-04-22 (×4): 25 mg via ORAL
  Filled 2021-04-21 (×4): qty 1

## 2021-04-21 MED ORDER — LATANOPROST 0.005 % OP SOLN
1.0000 [drp] | Freq: Every day | OPHTHALMIC | Status: DC
  Administered 2021-04-22: 1 [drp] via OPHTHALMIC
  Filled 2021-04-21: qty 2.5

## 2021-04-21 MED ORDER — APIXABAN 5 MG PO TABS
5.0000 mg | ORAL_TABLET | Freq: Two times a day (BID) | ORAL | Status: DC
  Administered 2021-04-21 – 2021-04-22 (×4): 5 mg via ORAL
  Filled 2021-04-21 (×4): qty 1

## 2021-04-21 MED ORDER — ONDANSETRON HCL 4 MG PO TABS
4.0000 mg | ORAL_TABLET | Freq: Four times a day (QID) | ORAL | Status: DC | PRN
  Filled 2021-04-21: qty 1

## 2021-04-21 MED ORDER — AMLODIPINE BESYLATE 5 MG PO TABS
5.0000 mg | ORAL_TABLET | Freq: Every day | ORAL | Status: DC
  Administered 2021-04-21 – 2021-04-22 (×2): 5 mg via ORAL
  Filled 2021-04-21 (×2): qty 1

## 2021-04-21 MED ORDER — ACETAMINOPHEN 500 MG PO TABS: 500.0000 mg | ORAL_TABLET | Freq: Four times a day (QID) | ORAL | Status: DC | PRN

## 2021-04-21 MED ORDER — LORAZEPAM 1 MG PO TABS
2.0000 mg | ORAL_TABLET | Freq: Once | ORAL | Status: AC
  Administered 2021-04-21: 2 mg via ORAL
  Filled 2021-04-21: qty 2

## 2021-04-21 MED ORDER — METFORMIN HCL 500 MG PO TABS
500.0000 mg | ORAL_TABLET | Freq: Two times a day (BID) | ORAL | Status: DC
  Administered 2021-04-22: 500 mg via ORAL
  Filled 2021-04-21: qty 1

## 2021-04-21 MED ORDER — NICOTINE 14 MG/24HR TD PT24
14.0000 mg | MEDICATED_PATCH | Freq: Every day | TRANSDERMAL | Status: DC
  Administered 2021-04-21 – 2021-04-22 (×2): 14 mg via TRANSDERMAL
  Filled 2021-04-21 (×2): qty 1

## 2021-04-21 MED ORDER — METOPROLOL SUCCINATE ER 25 MG PO TB24
25.0000 mg | ORAL_TABLET | Freq: Every day | ORAL | Status: DC
  Administered 2021-04-21 – 2021-04-22 (×2): 25 mg via ORAL
  Filled 2021-04-21 (×2): qty 1

## 2021-04-21 MED ORDER — ARIPIPRAZOLE 5 MG PO TABS
15.0000 mg | ORAL_TABLET | Freq: Every day | ORAL | Status: DC
  Administered 2021-04-21 – 2021-04-22 (×2): 15 mg via ORAL
  Filled 2021-04-21: qty 2
  Filled 2021-04-21 (×2): qty 1

## 2021-04-21 MED ORDER — DOCUSATE SODIUM 100 MG PO CAPS
400.0000 mg | ORAL_CAPSULE | Freq: Every day | ORAL | Status: DC
  Administered 2021-04-22 (×2): 400 mg via ORAL
  Filled 2021-04-21 (×2): qty 4

## 2021-04-21 NOTE — ED Notes (Signed)
No answer x 3 in lobby. 

## 2021-04-21 NOTE — ED Notes (Signed)
Pt walked towards ED lobby doors. Security notified. Pt redirected back to stretcher. No sitters available still.

## 2021-04-21 NOTE — ED Notes (Signed)
Pt lunch tray delivered and pt given a sprite to drink.

## 2021-04-21 NOTE — ED Notes (Signed)
Sitter at bedside.

## 2021-04-21 NOTE — ED Notes (Signed)
Pt placed in room for TTS assessment.  

## 2021-04-21 NOTE — ED Provider Notes (Signed)
Care assumed from previous PA at shift change. See note for full Hpi  In summation 30 year old here from initially thought to have CP with however patient had denied this throughout ED stay. Family was contacted and patient was apparently brought here for hallucinations and Suicidal ideation. Apparently off psych meds at home.  Hx of PE on anticoagulation. Previous provider thought low suspicion for ACS, PE. Given home meds here  Patient had been medically cleared by previous provider however was pending, Ethanol, salicylate and acetaminophen levels. TTS has already been consulted Physical Exam  BP (!) 153/135 (BP Location: Left Arm)   Pulse 87   Temp 98.5 F (36.9 C) (Oral)   Resp 19   SpO2 97%   Physical Exam Vitals and nursing note reviewed.  Constitutional:      General: He is not in acute distress.    Appearance: He is well-developed. He is not ill-appearing or diaphoretic.  HENT:     Head: Atraumatic.  Eyes:     Pupils: Pupils are equal, round, and reactive to light.  Cardiovascular:     Rate and Rhythm: Normal rate and regular rhythm.  Pulmonary:     Effort: Pulmonary effort is normal. No respiratory distress.  Abdominal:     General: There is no distension.     Palpations: Abdomen is soft.  Musculoskeletal:        General: Normal range of motion.     Cervical back: Normal range of motion and neck supple.  Skin:    General: Skin is warm and dry.  Neurological:     General: No focal deficit present.     Mental Status: He is alert and oriented to person, place, and time.    ED Course/Procedures   Clinical Course as of 04/21/21 1805  Sun Apr 21, 2021  1550 FU on Psych labs, TTS [BH]  1707 Patient intermittently agitated and not allowing for BP recheck. He is redirectable. Will hold on meds [BH]    Clinical Course User Index [BH] Buffi Ewton A, PA-C    Procedures Labs Reviewed  CBC WITH DIFFERENTIAL/PLATELET - Abnormal; Notable for the following  components:      Result Value   MCH 25.9 (*)    All other components within normal limits  SALICYLATE LEVEL - Abnormal; Notable for the following components:   Salicylate Lvl <7.0 (*)    All other components within normal limits  ACETAMINOPHEN LEVEL - Abnormal; Notable for the following components:   Acetaminophen (Tylenol), Serum <10 (*)    All other components within normal limits  RESP PANEL BY RT-PCR (FLU A&B, COVID) ARPGX2  BASIC METABOLIC PANEL  ETHANOL  RAPID URINE DRUG SCREEN, HOSP PERFORMED  TROPONIN I (HIGH SENSITIVITY)  TROPONIN I (HIGH SENSITIVITY)   DG Chest 2 View  Result Date: 04/20/2021 CLINICAL DATA:  Chest pain EXAM: CHEST - 2 VIEW COMPARISON:  09/13/2020 FINDINGS: Heart and mediastinal contours are within normal limits. No focal opacities or effusions. No acute bony abnormality. IMPRESSION: No active cardiopulmonary disease. Electronically Signed   By: Charlett Nose M.D.   On: 04/20/2021 23:46    MDM  Plan on FU on remaining labs.  Remaining  labs without significant abnormality.  Patient IVC by previous provider  Medically cleared  Dispo per psychiatry       Linwood Dibbles, PA-C 04/21/21 1806    Glendora Score, MD 04/21/21 2131

## 2021-04-21 NOTE — ED Notes (Signed)
Notified by ED tech that pt was not found in his room on hourly rounding. Consulting civil engineer and security notified. MD aware

## 2021-04-21 NOTE — ED Notes (Addendum)
Pt changed into purple scrubs and wanded by security. Pt belongings placed in locker #4. Pt glasses remained with pt.

## 2021-04-21 NOTE — Progress Notes (Signed)
Chaplain was responding a trauma next door to this pt; encountered this pt wandering the halls.  Pt has difficulty communicating; he may have trouble processing questions.  He was unable to say why he came in or what he needs.  He states he is Muslim and accepted prayer.  When asked what he would like chaplain to pray for, pt said "to get through the day." He continually tries to come out of room. He has just asked for a sandwich.  Chaplain communicated this to RN.  Pt appears to be getting quite restless. He is now talking to himself in the room.    Chaplain offered emotional and spiritual support.  Encouraged pt to stay in room, rest.    Belia Heman, Chaplain [phone]

## 2021-04-21 NOTE — BH Assessment (Addendum)
Comprehensive Clinical Assessment (CCA) Note  04/21/2021 Brandon [name] [id number]  Disposition: Per Ophelia Shoulder, NP, patient to remain in the ED overnight observation. Pending am psych evaluation. Patient to possibly discharge home in the am if psych cleared. Upon discharge patient to follow up with current provider (Dr. Jannifer Franklin) for medication adjustments.  [place]-SUICIDE SEVERITY RATING SCALE (C-SSRS) completed and patient scored, NO Risk". Therefore, no 1-1 sitter requirements recommended at this time.   Flowsheet Row ED from 04/20/2021 in [facility] EMERGENCY DEPARTMENT Admission (Discharged) from 03/05/2021 in [facility] INPATIENT ADULT 500B ED from 03/04/2021 in [facility] EMERGENCY DEPARTMENT  C-SSRS RISK CATEGORY No Risk No Risk No Risk       The patient demonstrates the following risk factors for suicide: Chronic risk factors for suicide include: psychiatric disorder of Schizophrenia . Acute risk factors for suicide include: N/A. Protective factors for this patient include: positive social support and positive therapeutic relationship. Considering these factors, the overall suicide risk at this point appears to be "No Risk". Patient is appropriate for outpatient follow up.   Chief Complaint:  Chief Complaint  Patient presents with   Chest Pain   Psychiatric Evaluation   Visit Diagnosis: Schizophrenia  Brandon [name] is a 30 y.o. male.  with past medical history significant for disorganized schizophrenia.  Legal guardian is his Mother, Brandon [name], Brandon [name], [phone].  He was seen face to face by this Clinician. Upon chart review patient admitted to [facility]: 03/25/2021, 03/20/2021, 03/15/2021, 03/11/2021, 03/06/2021, 03/05/2021. Also, has 5 Urgent Care visits to date. Patient's visits all have similar complaints.    His complaint today is, "I just came here to clear my head, last night I blanked out for no reason". Patient unable to  described the event that occurred. He was mostly starring at the camera and not responding to questions. Clinician asked questions 3 times or before patient respondent. However, when patient responded his responses were intermittently incomprehensible.   He says that the incident occurred at home where he lives with his documented Legal guardian. Mother, [phone]. Patient however says that he doesn't have a guardian.   Patient denies current suicidal ideations. Also, denies making any statements and/or gestures to warrant concerns for his safety. Denies current stressors. However, acknowledges feeling stressed in the past due to "thinking about my past relationships". Denies a family hx of mental health illnesses. Denies access to means such as firearms. Denies a hx of trauma and/or abuse. Appetite and sleep are poor.  Patient denies homicidal ideations. Denies hx of aggressive and/or assaultive behaviors. No plan and/or intent. No legal issues. Denies AVH's. Denies alcohol and/or drug use.   Patient with auditory hallucinations that tell him, "If you don't kill yourself, I'm going to knock you out". Patient asked about visual hallucinations and patient does not respond, appears to have fallen asleep. Patient's name called multiple times and he stares blankly into the camera.   During evaluation Brandon [name] is sitting up in chair in no acute distress.  He is alert/oriented person and place.  Currently he is calm and cooperative; He appears to be paranoid with blunt affect.  He is speaking intermittent in a clear tone at low volume, and normal pace; with minimal eye contact.  His thought process is disorganized.  Patient denies auditory/visual hallucinations but is evident that he is currently responding to responding to internal stimuli.    Legal guardian. Mother, [phone], Brandon [name]:  Clinician contacted Legal Guardian/Mother  via phone.  She explains that Brandon [name] last night was  hearing voices, unresponsive to her, responding to internal stimuli, and hitting hi closet doors. Therefore, she called police, who advised patient to be brought to the ED for a psych assessment.   is mother/guardian states that the behaviors last night after giving him his 8:30pm medications. States that he went to sleep shortly after medications were given.However, she heard him close to mid night in his room talking about hurting himself.  Patient's was repeating to his mother, "The voices keep talking to me and they are outside my window" and "The voices are just so loud".    According to the guardian/mother, he further made a comment about slitting his neck because the voices are loud and he hates living this way. His mother states that he became tearful and it was difficult to calm him down.  Patient also tried to take the television off the wall in her bedroom. He reportedly told his mother that the voices were coming out of the television. His outpatient provider is  Dr. Jannifer Franklin with [facility]. His mother says that the medications have been doing well. Especially during the time that he attends his day program 8a-4pm. However, indicates that "every once in a while, he has mild impulsive outburst".   Recently his evening medications were removed. Since meds were removed she has noticed that this is when the outburst occurs. She has observed him hallucinating. His mother plans to call Dr. Jannifer Franklin to request an evening medication to help manage patient's symptoms at that time during the day. According to his mother she has also noticed that patient is often responsive, has no eye contact, states that she must touch him to get his attention. His mother reports that patient has MS and reported concerns that those related medications are causing patient to be unresponsive when someone is talking to him. Denies EtOH, tobacco, illicit drug use.  CCA Screening, Triage and Referral  (STR)  Patient Reported Information How did you hear about [place]? Legal System  What Is the Reason for Your Visit/Call Today? TORETTO TINGLER is a 30 y.o. male.  with past medical history significant for disorganized schizophrenia.  Legal guardian is his Mother, Lyn, [place], [phone].  He was seen face to face by this Clinician. Upon chart review patient admitted to [facility]: 03/25/2021, 03/20/2021, 03/15/2021, 03/11/2021, 03/06/2021, 03/05/2021. Also, has 5 Urgent Care visits in 2020. Patient's visits all have similar complaints.    His complaint today is, "I just came here to clear my head, last night I blanked out for no reason". Patient unable to described the event that occurred. He was mostly starring at the camera and not responding to questions. Clinician asked questions 3 times or before patient respondent. However, when patient responded his responses were intermittently incomprehensible.   He says that the incident occurred at home where he lives with his documented Legal guardian. Mother, [phone]. Patient however says that he doesn't have a guardian.   Patient denies current suicidal ideations. Also, denies making any statements and/or gestures to warrant concerns for his safety. Denies current stressors. However, acknowledges feeling stressed in the past due to "thinking about my past relationships". Denies a family hx of mental health illnesses. Denies access to means such as firearms. Denies a hx of trauma and/or abuse. Appetite and sleep are poor  Patient denies homicidal ideations. Denies hx of aggressive and/or assaultive behaviors. No plan and/or intent. No legal issues. Denies AVH's. Denies alcohol and/or drug use.  Patient with auditory hallucinations that tell him, "If you don't kill yourself, I'm going to knock you out". Patient asked about visual hallucinations and patient does not respond, appears to have fallen asleep. Patient's name called multiple times and he stares blankly into the  camera.   During evaluation Brandon [name] is sitting up in chair in no acute distress.  He is alert/oriented person and place.  Currently he is calm and cooperative; He appears to be paranoid with blunt affect.  He is speaking intermittent in a clear tone at low volume, and normal pace; with minimal eye contact.  His thought process is disorganized.  Patient denies auditory/visual hallucinations but is evident that he is currently responding to responding to internal stimuli.  How Long Has This Been Causing You Problems? > than 6 months  What Do You Feel Would Help You the Most Today? Medication(s); Treatment for Depression or other mood problem   Have You Recently Had Any Thoughts About Hurting Yourself? No  Are You Planning to Commit Suicide/Harm Yourself At This time? No   Have you Recently Had Thoughts About Hurting Someone [name]? No  Are You Planning to Harm Someone at This Time? No  Explanation: No data recorded  Have You Used Any Alcohol or Drugs in the Past 24 Hours? No  How Long Ago Did You Use Drugs or Alcohol? No data recorded What Did You Use and How Much? No data recorded  Do You Currently Have a Therapist/Psychiatrist? Yes  Name of Therapist/Psychiatrist: Dr. Jannifer Franklin   Have You Been Recently Discharged From Any Office Practice or Programs? No  Explanation of Discharge From Practice/Program: No data recorded    CCA Screening Triage Referral Assessment Type of Contact: Tele-Assessment  Telemedicine Service Delivery: Telemedicine service delivery: This service was provided via telemedicine using a 2-way, interactive audio and video technology  Is this Initial or Reassessment? Initial Assessment  Date Telepsych consult ordered in [facility]:  04/21/21  Time Telepsych consult ordered in [facility]:  1326  Location of Assessment: [facility] ED  Provider Location: [facility]   Collateral Involvement: Legal guardian. Mother, [phone], [name]   Does  Patient Have a Court Appointed Legal Guardian? No data recorded Name and Contact of Legal Guardian: No data recorded If Minor and Not Living with Parent(s), Who has Custody? n/a  Is CPS involved or ever been involved? Never  Is APS involved or ever been involved? Never   Patient Determined To Be At Risk for Harm To Self or Others Based on Review of Patient Reported Information or Presenting Complaint? No  Method: No data recorded Availability of Means: No data recorded Intent: No data recorded Notification Required: No data recorded Additional Information for Danger to Others Potential: No data recorded Additional Comments for Danger to Others Potential: No data recorded Are There Guns or Other Weapons in Your Home? No data recorded Types of Guns/Weapons: No data recorded Are These Weapons Safely Secured?                            No data recorded Who Could Verify You Are Able To Have These Secured: No data recorded Do You Have any Outstanding Charges, Pending Court Dates, Parole/Probation? No data recorded Contacted To Inform of Risk of Harm To Self or Others: -- (N/A)    Does Patient Present under Involuntary Commitment? Yes  IVC Papers Initial File Date: 03/05/21   [place] of Residence: [place]   Patient  Currently Receiving the Following Services: Medication Management (Currently in a day program)   Determination of Need: Emergent (2 hours)   Options For Referral: Medication Management     CCA Biopsychosocial Patient Reported Schizophrenia/Schizoaffective Diagnosis in Past: Yes   Strengths: UTA   Mental Health Symptoms Depression:   None   Duration of Depressive symptoms:  Duration of Depressive Symptoms: N/A   Mania:   Racing thoughts   Anxiety:    N/A   Psychosis:   Grossly disorganized or catatonic behavior; Hallucinations   Duration of Psychotic symptoms:  Duration of Psychotic Symptoms: Greater than six months   Trauma:   N/A    Obsessions:   N/A   Compulsions:   N/A   Inattention:   N/A   Hyperactivity/Impulsivity:   N/A   Oppositional/Defiant Behaviors:   N/A   Emotional Irregularity:   -- (Pt spoke of "an outburst" but mother could not be contacted to get additional information.)   Other Mood/Personality Symptoms:   N/A    Mental Status Exam Appearance and self-care  Stature:   Tall   Weight:   Overweight   Clothing:   Disheveled   Grooming:   Neglected   Cosmetic use:   None   Posture/gait:   Other (Comment)   Motor activity:   Slowed   Sensorium  Attention:   Normal   Concentration:   Variable   Orientation:   Person; Place; Situation   Recall/memory:   Defective in Immediate; Defective in Short-term   Affect and Mood  Affect:   Flat   Mood:   Euthymic   Relating  Eye contact:   Normal   Facial expression:   Constricted   Attitude toward examiner:   Cooperative   Thought and Language  Speech flow:  Garbled; Paucity; Slurred   Thought content:   Appropriate to Mood and Circumstances   Preoccupation:   None   Hallucinations:   Auditory   Organization:  No data recorded  [employer] of Knowledge:   Poor   Intelligence:   Needs investigation   Abstraction:   Abstract   Judgement:   Impaired   Reality Testing:   Distorted   Insight:   Lacking   Decision Making:   Impulsive   Social Functioning  Social Maturity:   Impulsive   Social Judgement:   Heedless   Stress  Stressors:   Family conflict; Illness   Coping Ability:   Overwhelmed   Skill Deficits:   Activities of daily living; Decision making; Self-control; Responsibility   Supports:   Family; Friends/Service system     Religion: Religion/Spirituality Are You A Religious Person?: No How Might This Affect Treatment?: N/A  Leisure/Recreation: Leisure / Recreation Do You Have Hobbies?: Yes Leisure and Hobbies: Video  games  Exercise/Diet:     CCA Employment/Education Employment/Work Situation: Employment / Work Situation Employment Situation: On disability Why is Patient on Disability: MS and MH How Long has Patient Been on Disability: Not assessed. Patient's Job has Been Impacted by Current Illness: No Has Patient ever Been in the [employer]?: No  Education: Education Is Patient Currently Attending School?: No Did You Product manager?: No Did You Have Any Difficulty At School?: No Patient's Education Has Been Impacted by Current Illness: No   CCA Family/Childhood History Family and Relationship History: Family history Marital status: Single Does patient have children?: No  Childhood History:  Childhood History By whom was/is the patient raised?: Mother Did patient suffer any verbal/emotional/physical/sexual  abuse as a child?: No Did patient suffer from severe childhood neglect?: No Has patient ever been sexually abused/assaulted/raped as an adolescent or adult?: No Was the patient ever a victim of a crime or a disaster?: No Witnessed domestic violence?: No Has patient been affected by domestic violence as an adult?: No  Child/Adolescent Assessment:     CCA Substance Use Alcohol/Drug Use: Alcohol / Drug Use Pain Medications: See MAR Prescriptions: See MAR Over the Counter: See MAR History of alcohol / drug use?: No history of alcohol / drug abuse                         ASAM's:  Six Dimensions of Multidimensional Assessment  Dimension 1:  Acute Intoxication and/or Withdrawal Potential:      Dimension 2:  Biomedical Conditions and Complications:      Dimension 3:  Emotional, Behavioral, or Cognitive Conditions and Complications:     Dimension 4:  Readiness to Change:     Dimension 5:  Relapse, Continued use, or Continued Problem Potential:     Dimension 6:  Recovery/Living Environment:     ASAM Severity Score:    ASAM Recommended Level of Treatment:      Substance use Disorder (SUD)    Recommendations for Services/Supports/Treatments: Recommendations for Services/Supports/Treatments Recommendations For Services/Supports/Treatments: Inpatient Hospitalization, Medication Management  Discharge Disposition:    DSM5 Diagnoses: Patient Active Problem List   Diagnosis Date Noted   Schizophrenia, chronic with acute exacerbation (HCC) 03/13/2021   Schizophrenia, disorganized type (HCC) 03/05/2021   Falls 09/13/2020   Unspecified intellectual disabilities 01/27/2020   Migraine headache 04/18/2019   Generalized weakness    History of pulmonary embolism 08/12/2017   Multiple sclerosis exacerbation (HCC) 07/30/2017   Left-sided weakness 01/18/2014   Multiple sclerosis (HCC) 05/20/2013   Unsteady gait 11/23/2012   HTN (hypertension) 07/04/2011   Ataxia 07/02/2011   Obesity, Class III, BMI 40-49.9 (morbid obesity) (HCC) 01/14/2010   ADHD 01/14/2010     Referrals to Alternative Service(s): Referred to Alternative Service(s):   Place:   Date:   Time:    Referred to Alternative Service(s):   Place:   Date:   Time:    Referred to Alternative Service(s):   Place:   Date:   Time:    Referred to Alternative Service(s):   Place:   Date:   Time:     Melynda Ripple, Counselor

## 2021-04-21 NOTE — ED Provider Notes (Signed)
[facility] EMERGENCY DEPARTMENT Provider Note   CSN: [id number] Arrival date & time: 04/20/21  2127     History Chief Complaint  Brandon [name] presents with   Chest Pain    Brandon [name] is a 30 y.o. male.  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  Level 5 caveat due to MR/psychosis  Brandon [name] Brandon [name] is not particularly willing to provide any history to me.  He shakes his head no when asked about having chest pain.  Discussed with Brandon [name]'s mother who is his legal guardian.  She states that she has been worried about him because he has been saying that he was going to break the mirror and cut his neck with a glass.  Also has been hallucinating and talking to people who were not there.  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   Falls 09/13/2020   Unspecified intellectual disabilities 01/27/2020   Migraine headache 04/18/2019   Generalized weakness    History of pulmonary embolism 08/12/2017   Multiple sclerosis exacerbation (HCC) 07/30/2017   Left-sided weakness 01/18/2014   Multiple sclerosis (HCC) 05/20/2013   Unsteady gait 11/23/2012   HTN (hypertension) 07/04/2011   Ataxia 07/02/2011   Obesity, Class III, BMI 40-49.9 (morbid obesity) (HCC) 01/14/2010    ADHD 01/14/2010    Past Surgical History:  Procedure Laterality Date   NO PAST SURGERIES     None     RADIOLOGY WITH ANESTHESIA N/A 01/16/2021   Procedure: MRI WITH ANESTHESIA CERVICAL AND THORASIC SPINE WITH AND WITHOUT CONTRAST;  Surgeon: Radiologist, Medication, MD;  Location: [facility] OR;  Service: Radiology;  Laterality: N/A;   TOOTH EXTRACTION N/A 06/24/2019   Procedure: DENTAL RESTORATION/EXTRACTION OF TEETH NUMBER ONE, SIXTEEN, SEVENTEEN, NINETEEN, THIRTY-TWO;  Surgeon: Ocie Doyne, DDS;  Location: [facility] OR;  Service: Oral Surgery;  Laterality: N/A;       Family History  Problem Relation Age of Onset   Diabetes Mother    ADD / ADHD Brother     Social History   Tobacco Use   Smoking status: Every Day    Packs/day: 0.25    Types: Cigarettes   Smokeless tobacco: Never   Tobacco comments:    2 cigarettes a day  Vaping Use   Vaping Use: Never used  Substance Use Topics   Alcohol use: Not Currently    Alcohol/week: 0.0 standard drinks    Comment: "A little bit"    Drug use: Not Currently    Types: Marijuana    Comment: Last used: unknown     Home Medications Prior to Admission medications   Medication Sig Start Date End Date Taking? Authorizing Provider  acetaminophen (TYLENOL) 500 MG  tablet Take 1 tablet (500 mg total) by mouth every 6 (six) hours as needed. Brandon [name] taking differently: Take 500 mg by mouth every 6 (six) hours as needed for moderate pain. 06/21/20   Fayrene Helper, PA-C  amLODipine (NORVASC) 5 MG tablet Take 5 mg by mouth daily. 09/06/20   [provider]  apixaban (ELIQUIS) 5 MG TABS tablet Take 5 mg by mouth 2 (two) times daily.     [provider]  ARIPiprazole (ABILIFY) 15 MG tablet Take 1 tablet (15 mg total) by mouth daily. 03/29/21 04/28/21  Roselle Locus, MD  cloZAPine (CLOZARIL) 100 MG tablet Take 1 tablet (100 mg total) by mouth every morning. 03/28/21 04/27/21  Roselle Locus, MD  cloZAPine (CLOZARIL) 50 MG tablet Take  3 tablets (150 mg total) by mouth at bedtime. 03/28/21   Roselle Locus, MD  docusate sodium (COLACE) 100 MG capsule Take 400 mg by mouth at bedtime.     [provider]  latanoprost (XALATAN) 0.005 % ophthalmic solution INSTILL 1 DROP INTO BOTH EYES EVERY NIGHT 11/23/20 11/23/21    loratadine (CLARITIN) 10 MG tablet Take 10 mg by mouth daily.    [provider]  LORazepam (ATIVAN) 0.5 MG tablet Take 1 tablet (0.5 mg total) by mouth at bedtime. 03/28/21 04/27/21  Roselle Locus, MD  metoprolol succinate (TOPROL-XL) 25 MG 24 hr tablet Take 1 tablet (25 mg total) by mouth daily. 03/29/21 04/28/21  Roselle Locus, MD  Multiple Vitamin (MULTIVITAMIN WITH MINERALS) TABS tablet Take 1 tablet by mouth daily.    [provider]  nicotine (NICODERM CQ - DOSED IN MG/24 HOURS) 14 mg/24hr patch Place 1 patch (14 mg total) onto the skin daily. 03/28/21   Roselle Locus, MD  ocrelizumab (OCREVUS) 300 MG/10ML injection Inject 2 vials ( 20 MLs) into the vein every 6 months 04/01/21   York Spaniel, MD  ocrelizumab 600 mg in sodium chloride 0.9 % 500 mL Inject 600 mg into the vein every 6 (six) months. 04/01/21   York Spaniel, MD  topiramate (TOPAMAX) 25 MG tablet TAKE ONE TABLET BY MOUTH TWICE A DAY 03/19/21   Glean Salvo, NP  gabapentin (NEURONTIN) 400 MG capsule Take 400 mg by mouth 3 (three) times daily. 12/26/20 01/14/21  [provider]  propranolol (INDERAL) 10 MG tablet Take 1 tablet (10 mg total) by mouth 2 (two) times daily. 05/03/15 01/03/20  Charm Rings, NP    Allergies    Brandon [name] has no known allergies.  Review of Systems   Review of Systems  Unable to perform ROS: Psychiatric disorder   Physical Exam Updated Vital Signs BP (!) 153/135 (BP Location: Left Arm)   Pulse 90   Temp 98.5 F (36.9 C) (Oral)   Resp 19   SpO2 100%   Physical Exam Vitals and nursing note reviewed.  Constitutional:      General: He is not in acute  distress.    Appearance: He is obese.  HENT:     Head: Normocephalic and atraumatic.     Nose: Nose normal.  Eyes:     General: No scleral icterus. Cardiovascular:     Rate and Rhythm: Normal rate and regular rhythm.     Pulses: Normal pulses.     Heart sounds: Normal heart sounds.  Pulmonary:     Effort: Pulmonary effort is normal. No respiratory distress.     Breath sounds: No wheezing.  Abdominal:  Palpations: Abdomen is soft.     Tenderness: There is no abdominal tenderness.  Musculoskeletal:     Cervical back: Normal range of motion.     Right lower leg: No edema.     Left lower leg: No edema.     Comments: No lower extremity edema  Skin:    General: Skin is warm and dry.     Capillary Refill: Capillary refill takes less than 2 seconds.  Neurological:     Mental Status: He is alert. Mental status is at baseline.  Psychiatric:     Comments: Depressed, withdrawn, reticent    ED Results / Procedures / Treatments   Labs (all labs ordered are listed, but only abnormal results are displayed) Labs Reviewed  CBC WITH DIFFERENTIAL/PLATELET - Abnormal; Notable for the following components:      Result Value   MCH 25.9 (*)    All other components within normal limits  RESP PANEL BY RT-PCR (FLU A&B, COVID) ARPGX2  BASIC METABOLIC PANEL  ETHANOL  RAPID URINE DRUG SCREEN, HOSP PERFORMED  SALICYLATE LEVEL  ACETAMINOPHEN LEVEL  TROPONIN I (HIGH SENSITIVITY)  TROPONIN I (HIGH SENSITIVITY)    EKG EKG Interpretation  Date/Time:  Saturday April 20 2021 22:48:47 EDT Ventricular Rate:  74 PR Interval:  132 QRS Duration: 90 QT Interval:  364 QTC Calculation: 404 R Axis:   -3 Text Interpretation: Normal sinus rhythm Cannot rule out Anterior infarct , age undetermined Abnormal ECG rate decreased from prior Confirmed by Benjiman Core [phone]) on 04/21/2021 11:09:48 AM  Radiology DG Chest 2 View  Result Date: 04/20/2021 CLINICAL DATA:  Chest pain EXAM: CHEST - 2  VIEW COMPARISON:  09/13/2020 FINDINGS: Heart and mediastinal contours are within normal limits. No focal opacities or effusions. No acute bony abnormality. IMPRESSION: No active cardiopulmonary disease. Electronically Signed   By: Charlett Nose M.D.   On: 04/20/2021 23:46    Procedures Procedures   Medications Ordered in ED Medications  amLODipine (NORVASC) tablet 5 mg (5 mg Oral Given 04/21/21 1525)  topiramate (TOPAMAX) tablet 25 mg (25 mg Oral Given 04/21/21 1534)  acetaminophen (TYLENOL) tablet 500 mg (has no administration in time range)  apixaban (ELIQUIS) tablet 5 mg (5 mg Oral Given 04/21/21 1536)  metFORMIN (GLUCOPHAGE) tablet 500 mg (has no administration in time range)  ARIPiprazole (ABILIFY) tablet 15 mg (15 mg Oral Given 04/21/21 1526)  metoprolol succinate (TOPROL-XL) 24 hr tablet 25 mg (25 mg Oral Given 04/21/21 1524)  nicotine (NICODERM CQ - dosed in mg/24 hours) patch 14 mg (14 mg Transdermal Patch Applied 04/21/21 1535)  latanoprost (XALATAN) 0.005 % ophthalmic solution 1 drop (has no administration in time range)  docusate sodium (COLACE) capsule 400 mg (has no administration in time range)  traZODone (DESYREL) tablet 50 mg (has no administration in time range)  ondansetron (ZOFRAN) tablet 4 mg (has no administration in time range)  LORazepam (ATIVAN) tablet 2 mg (2 mg Oral Given 04/21/21 1525)    ED Course  I have reviewed the triage vital signs and the nursing notes.  Pertinent labs & imaging results that were available during my care of the Brandon [name] were reviewed by me and considered in my medical decision making (see chart for details).    MDM Rules/Calculators/A&P                           Brandon [name] here due to suicidal thoughts hallucinations not take his medications.  He is hypertensive  here in the ER but does not take his blood pressure medications these were ordered along with home medications including his anticoagulant.  Discussed with mother who wishes him  to be placed under IVC.  Given the circumstances I think this is reasonable. Paperwork signed by Dr. Rubin Payor  Diet order placed.  As needed's placed as needed.  Brandon [name]'s medications ordered and administered.  BMP unremarkable, CBC without leukocytosis or anemia troponin x2 within normal limits.  Medical screening labs obtained.  Chest x-ray unremarkable  From a chest pain standpoint which is what Brandon [name] was initially triaged as Brandon [name] is very well-appearing.  I have very much doubt ACS or dissection or PE.  Brandon [name] is anticoagulated.  Seems that he has been taking this medication per his mother.  Brit Henderly PA-C follow up on labs and BP.   Final Clinical Impression(s) / ED Diagnoses Final diagnoses:  Atypical chest pain  Hypertension, unspecified type  Suicidal thoughts  Hallucination    Rx / DC Orders ED Discharge Orders     None        Gailen Shelter, [place] 04/21/21 1549    Margarita Grizzle, MD 04/26/21 [phone]

## 2021-04-22 ENCOUNTER — Encounter (HOSPITAL_COMMUNITY): Payer: Self-pay | Admitting: Registered Nurse

## 2021-04-22 DIAGNOSIS — F201 Disorganized schizophrenia: Secondary | ICD-10-CM | POA: Diagnosis not present

## 2021-04-22 DIAGNOSIS — R451 Restlessness and agitation: Secondary | ICD-10-CM

## 2021-04-22 LAB — RESP PANEL BY RT-PCR (FLU A&B, COVID) ARPGX2
Influenza A by PCR: NEGATIVE
Influenza B by PCR: NEGATIVE
SARS Coronavirus 2 by RT PCR: NEGATIVE

## 2021-04-22 LAB — CBG MONITORING, ED: Glucose-Capillary: 70 mg/dL (ref 70–99)

## 2021-04-22 MED ORDER — LORAZEPAM 2 MG/ML IJ SOLN
2.0000 mg | Freq: Once | INTRAMUSCULAR | Status: AC
  Administered 2021-04-22: 2 mg via INTRAMUSCULAR
  Filled 2021-04-22: qty 1

## 2021-04-22 MED ORDER — ZIPRASIDONE MESYLATE 20 MG IM SOLR
20.0000 mg | Freq: Once | INTRAMUSCULAR | Status: AC
  Administered 2021-04-22: 20 mg via INTRAMUSCULAR
  Filled 2021-04-22: qty 20

## 2021-04-22 NOTE — ED Notes (Signed)
Pt was incontinent of urine. Tried to stand up but was very unsteady. Assisted by 2 male staff back to bed. Changed into a new scrub pants. Will continue to monitor. Will reassess when pt wakes up. Pending dc.

## 2021-04-22 NOTE — ED Notes (Signed)
Sharief Wainwright mother [phone] requesting an update

## 2021-04-22 NOTE — ED Notes (Signed)
Attempted to awaken pt at this time. Pt opened his eyes and moved his arms some. Pt is still too drowsy. Offered dinner at this time. Pt fell back to sleep. Will continue to monitor.

## 2021-04-22 NOTE — ED Notes (Signed)
Pt currently asleep. No acute distress. Safety precautions maintained.  

## 2021-04-22 NOTE — ED Notes (Signed)
Talking to psych at this time via TTS.  

## 2021-04-22 NOTE — ED Notes (Addendum)
Spoke to pt mother at this time and explained the situation. Pt is currently sedated from previous meds. Will reevaluate once pt wake up for discharge.

## 2021-04-22 NOTE — ED Notes (Addendum)
Pt is now asleep and sedated. Dr.Wentz attempted to talk to pt to discharge pt with no success. Pt previously had Geodon IM and Ativan IM for agitation. VS taken and stable. Pending discharge when pt wakes up. Charge nurse aware of situation. Will notify pt mom at this time. Will continue to monitor.

## 2021-04-22 NOTE — ED Notes (Signed)
Pt's safety sitter left at 0300. Pt found running down the hall towards the lobby doors. This RN directed pt back to his bed. Pt is currently sitting on the edge & foot of his bed as this RN sits in front of him on mobile computer. Pt stated to this RN, "I need to find my bag. I feel like I'm in a cage. I'm bored." This RN attempted to contact staffing office for sitter availability. Runner, broadcasting/film/video currently unavailable at this time.

## 2021-04-22 NOTE — ED Notes (Signed)
Pt is now awake. Blood sugar and VS checked as pt still appears drowsy. Offered dinner at this time. Pt sat up on bed. Will notify MD for reevaluation and discharge. Mom is aware of discharge. Pt will be provided with transportation home. Pending discharge dispo from provider.

## 2021-04-22 NOTE — ED Notes (Signed)
Pt is moving some on bed but is still heavily sedated. Will continue to monitor.

## 2021-04-22 NOTE — ED Notes (Signed)
Pt went to the bathroom and came out screaming and agitated. Pt was observed talking to himself. Pt suddenly became anxious and upset. Asked by RN what happened. Pt started screaming more. MD notified. Geodon IM and Ativan IM given. Security officers at bedside this time for safety. Will continue to monitor.

## 2021-04-22 NOTE — ED Notes (Signed)
Pt has walked out of his room twice since after medications were given and has pointed his fingers to the door. Redirected by staff to go back to his room. Safety precautions maintained. Will continue to monitor.

## 2021-04-22 NOTE — Consult Note (Addendum)
Telepsych Consultation   Reason for Consult:  aggressive behavior Referring Physician:  Gailen Shelter, PA Location of Patient: [facility] ED Location of Provider: Other: [facility]  Patient Identification: Brandon [name] MRN:  [id number] Principal Diagnosis: Schizophrenia, 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HCC) Diagnosis:  Principal Problem:   Schizophrenia, 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HCC) Active Problems:   Unspecified intellectual disabilities   Agitation  Total Time spent with patient: 30 minutes  Subjective:   Brandon [name] is a 30 y.o. male patient admitted to [facility] ED after presenting with complaints of suicidal ideation patient stating he needed to get his head cleared.Marland Kitchen  HPI:  Brandon [name], 30 y.o., male patient seen via tele health by this provider, consulted with Dr. Nelly Rout; and chart reviewed on 04/22/21.  On evaluation Brandon [name] reports he was brought to the emergency room because he was hearing voices.  Patient states he is no longer hearing voices and voices usually go away when he is taking his medication.  Patient denies suicidal/self-harm/homicidal ideation, psychosis, and paranoia.  Patient reports he slept without any difficulty and is eating without any difficulty.  Patient reports he is taking his psychotropic medications.  Patient states he lives with his mother and gave permission to speak to his mother for collateral information.  Patient has outpatient psychiatric services with Dr. An appointment is scheduled for Monday next week. During evaluation Brandon [name] is sitting upright in chair in no acute distress.  He is alert, oriented x 3, calm and cooperative.  His mood is dysphoric with congruent affect.  He does not appear to be responding to internal/external stimuli or delusional thoughts.  With some questions patient does appear to have delayed response.  But, he denies suicidal/self-harm/homicidal ideation, psychosis, and paranoia.  Patient answered question appropriately.  Patient  gave permission to speak to his mother for collateral information [place] at [phone]  Collateral Information: Spoke with patient's mother who reports her main concern is patient needs a medication to be given midday for agitation.  States he is taking his medication as prescribed because she gives it to him "But around 2 or 3 PM he starts to get a little agitated and I wanted to see if there was something that could be given to him to help with that."  Patient has outpatient psychiatric services with Dr. Jannifer Franklin and next scheduled appointment is 04/29/2021.  Patient's mood mother reports that she is his guardian.  Mother also encouraged to follow-up with Dr. Jannifer Franklin for medication management to get patient's medications adjusted.  Mother reports she is currently at a doctor's office and she is also on a wheelchair, and does not drive.  States patient is usually sent home in October but she will not be home until after 2 PM.   Past Psychiatric History: See below  Risk to Self: No Risk to Others: No Prior Inpatient Therapy: Yes Prior Outpatient Therapy: Yes  Past Medical History:  Past Medical History:  Diagnosis Date   ADHD (attention deficit hyperactivity disorder)    Bipolar 1 disorder (HCC)    Chronic back pain    Chronic constipation    Chronic neck pain    Hypertension    Multiple sclerosis (HCC) 05/20/2013   left sided weakness, dysarthria   Non-compliance    Obesity    Pulmonary embolism (HCC)    Schizophrenia (HCC)    Stroke (HCC)    left sided deficits - pt's mother denies this    Past Surgical History:  Procedure Laterality Date  NO PAST SURGERIES     None     RADIOLOGY WITH ANESTHESIA N/A 01/16/2021   Procedure: MRI WITH ANESTHESIA CERVICAL AND THORASIC SPINE WITH AND WITHOUT CONTRAST;  Surgeon: Radiologist, Medication, MD;  Location: [facility] OR;  Service: Radiology;  Laterality: N/A;   TOOTH EXTRACTION N/A 06/24/2019   Procedure: DENTAL RESTORATION/EXTRACTION OF TEETH  NUMBER ONE, SIXTEEN, SEVENTEEN, NINETEEN, THIRTY-TWO;  Surgeon: Ocie Doyne, DDS;  Location: [facility] OR;  Service: Oral Surgery;  Laterality: N/A;   Family History:  Family History  Problem Relation Age of Onset   Diabetes Mother    ADD / ADHD Brother    Family Psychiatric  History: See above Social History:  Social History   Substance and Sexual Activity  Alcohol Use Not Currently   Alcohol/week: 0.0 standard drinks   Comment: "A little bit"      Social History   Substance and Sexual Activity  Drug Use Not Currently   Types: Marijuana   Comment: Last used: unknown     Social History   Socioeconomic History   Marital status: Single    Spouse name: Not on file   Number of children: 0   Years of education: 11th   Highest education level: Not on file  Occupational History   Occupation: unemployed    Employer: [employer]    Comment: Disbaled  Tobacco Use   Smoking status: Every Day    Packs/day: 0.25    Types: Cigarettes   Smokeless tobacco: Never   Tobacco comments:    2 cigarettes a day  Vaping Use   Vaping Use: Never used  Substance and Sexual Activity   Alcohol use: Not Currently    Alcohol/week: 0.0 standard drinks    Comment: "A little bit"    Drug use: Not Currently    Types: Marijuana    Comment: Last used: unknown    Sexual activity: Not on file  Other Topics Concern   Not on file  Social History Narrative   Patient lives at home with his mother.   Disabled.   Education 11 th grade .   Right handed.   Drinks caffeine occassionally   Social Determinants of Corporate investment banker Strain: Not on file  Food Insecurity: Not on file  Transportation Needs: Not on file  Physical Activity: Not on file  Stress: Not on file  Social Connections: Not on file   Additional Social History:    Allergies:  No Known Allergies  Labs:  Results for orders placed or performed during the hospital encounter of 04/20/21 (from the past 48 hour(s))  CBC  with Differential     Status: Abnormal   Collection Time: 04/20/21 11:09 PM  Result Value Ref Range   WBC 8.0 4.0 - 10.5 K/uL   RBC 5.01 4.22 - 5.81 MIL/uL   Hemoglobin 13.0 13.0 - 17.0 g/dL   HCT 84.1 66.0 - 63.0 %   MCV 83.4 80.0 - 100.0 fL   MCH 25.9 (L) 26.0 - 34.0 pg   MCHC 31.1 30.0 - 36.0 g/dL   RDW 16.0 10.9 - 32.3 %   Platelets 225 150 - 400 K/uL   nRBC 0.0 0.0 - 0.2 %   Neutrophils Relative % 66 %   Neutro Abs 5.3 1.7 - 7.7 K/uL   Lymphocytes Relative 23 %   Lymphs Abs 1.8 0.7 - 4.0 K/uL   Monocytes Relative 7 %   Monocytes Absolute 0.6 0.1 - 1.0 K/uL   Eosinophils Relative  2 %   Eosinophils Absolute 0.2 0.0 - 0.5 K/uL   Basophils Relative 1 %   Basophils Absolute 0.1 0.0 - 0.1 K/uL   Immature Granulocytes 1 %   Abs Immature Granulocytes 0.04 0.00 - 0.07 K/uL    Comment: Performed at [facility], [address], [place], [place] [address]  Basic metabolic panel     Status: None   Collection Time: 04/20/21 11:09 PM  Result Value Ref Range   Sodium 138 135 - 145 mmol/L   Potassium 3.5 3.5 - 5.1 mmol/L   Chloride 107 98 - 111 mmol/L   CO2 23 22 - 32 mmol/L   Glucose, Bld 94 70 - 99 mg/dL    Comment: Glucose reference range applies only to samples taken after fasting for at least 8 hours.   BUN 16 6 - 20 mg/dL   Creatinine, Ser 1.41 0.61 - 1.24 mg/dL   Calcium 9.2 8.9 - 03.0 mg/dL   GFR, Estimated >13 >14 mL/min    Comment: (NOTE) Calculated using the CKD-EPI Creatinine Equation (2021)    Anion gap 8 5 - 15    Comment: Performed at [facility], [address] [place], [place] [address]  Troponin I (High Sensitivity)     Status: None   Collection Time: 04/20/21 11:09 PM  Result Value Ref Range   Troponin I (High Sensitivity) 4 <18 ng/L    Comment: (NOTE) Elevated high sensitivity troponin I (hsTnI) values and significant  changes across serial measurements may suggest ACS but many other  chronic and acute conditions are known to elevate hsTnI  results.  Refer to the Links section for chest pain algorithms and additional  guidance. Performed at [name], [address], [place], [place] [address]   Troponin I (High Sensitivity)     Status: None   Collection Time: 04/21/21  1:29 AM  Result Value Ref Range   Troponin I (High Sensitivity) 4 <18 ng/L    Comment: (NOTE) Elevated high sensitivity troponin I (hsTnI) values and significant  changes across serial measurements may suggest ACS but many other  chronic and acute conditions are known to elevate hsTnI results.  Refer to the "Links" section for chest pain algorithms and additional  guidance. Performed at [facility], [address], [place], [place] [address]   Resp Panel by RT-PCR (Flu A&B, Covid) Nasopharyngeal Swab     Status: None   Collection Time: 04/21/21  8:35 AM   Specimen: Nasopharyngeal Swab; Nasopharyngeal(NP) swabs in vial transport medium  Result Value Ref Range   SARS Coronavirus 2 by RT PCR NEGATIVE NEGATIVE    Comment: (NOTE) SARS-CoV-2 target nucleic acids are NOT DETECTED.  The SARS-CoV-2 RNA is generally detectable in upper respiratory specimens during the acute phase of infection. The lowest concentration of SARS-CoV-2 viral copies this assay can detect is 138 copies/mL. A negative result does not preclude SARS-Cov-2 infection and should not be used as the sole basis for treatment or other patient management decisions. A negative result may occur with  improper specimen collection/handling, submission of specimen other than nasopharyngeal swab, presence of viral mutation(s) within the areas targeted by this assay, and inadequate number of viral copies(<138 copies/mL). A negative result must be combined with clinical observations, patient history, and epidemiological information. The expected result is Negative.  Fact Sheet for Patients:  BloggerCourse.com  Fact Sheet for Healthcare Providers:   SeriousBroker.it  This test is no t yet approved  or cleared by the [place] and  has been authorized for detection and/or diagnosis of SARS-CoV-2 by FDA under an Emergency Use Authorization (EUA). This EUA will remain  in effect (meaning this test can be used) for the duration of the COVID-19 declaration under Section 564(b)(1) of the Act, 21 U.S.C.section 360bbb-3(b)(1), unless the authorization is terminated  or revoked sooner.       Influenza A by PCR NEGATIVE NEGATIVE   Influenza B by PCR NEGATIVE NEGATIVE    Comment: (NOTE) The Xpert Xpress SARS-CoV-2/FLU/RSV plus assay is intended as an aid in the diagnosis of influenza from Nasopharyngeal swab specimens and should not be used as a sole basis for treatment. Nasal washings and aspirates are unacceptable for Xpert Xpress SARS-CoV-2/FLU/RSV testing.  Fact Sheet for Patients: BloggerCourse.com  Fact Sheet for Healthcare Providers: SeriousBroker.it  This test is not yet approved or cleared by the [place] FDA and has been authorized for detection and/or diagnosis of SARS-CoV-2 by FDA under an Emergency Use Authorization (EUA). This EUA will remain in effect (meaning this test can be used) for the duration of the COVID-19 declaration under Section 564(b)(1) of the Act, 21 U.S.C. section 360bbb-3(b)(1), unless the authorization is terminated or revoked.  Performed at [facility], [address] [place], [place] [address]   Ethanol     Status: None   Collection Time: 04/21/21  4:01 PM  Result Value Ref Range   Alcohol, Ethyl (B) <10 <10 mg/dL    Comment: (NOTE) Lowest detectable limit for serum alcohol is 10 mg/dL.  For medical purposes only. Performed at [facility], [address] [place], [place] [address]   Salicylate level     Status: Abnormal   Collection Time: 04/21/21  4:01 PM  Result Value Ref Range    Salicylate Lvl <7.0 (L) 7.0 - 30.0 mg/dL    Comment: Performed at [facility], [address], [place], [place] [address]  Acetaminophen level     Status: Abnormal   Collection Time: 04/21/21  4:01 PM  Result Value Ref Range   Acetaminophen (Tylenol), Serum <10 (L) 10 - 30 ug/mL    Comment: (NOTE) Therapeutic concentrations vary significantly. A range of 10-30 ug/mL  may be an effective concentration for many patients. However, some  are best treated at concentrations outside of this range. Acetaminophen concentrations >150 ug/mL at 4 hours after ingestion  and >50 ug/mL at 12 hours after ingestion are often associated with  toxic reactions.  Performed at [facility], [address], [place], [place] [address]     Medications:  Current Facility-Administered Medications  Medication Dose Route Frequency Provider Last Rate Last Admin   acetaminophen (TYLENOL) tablet 500 mg  500 mg Oral Q6H PRN Solon Augusta S, PA       amLODipine (NORVASC) tablet 5 mg  5 mg Oral Daily Solon Augusta S, PA   5 mg at 04/22/21 1039   apixaban (ELIQUIS) tablet 5 mg  5 mg Oral BID Solon Augusta S, PA   5 mg at 04/22/21 1040   ARIPiprazole (ABILIFY) tablet 15 mg  15 mg Oral Daily Solon Augusta S, PA   15 mg at 04/22/21 1039   docusate sodium (COLACE) capsule 400 mg  400 mg Oral QHS Fond[place], Wylder S, PA   400 mg at 04/22/21 0027   latanoprost (XALATAN) 0.005 % ophthalmic solution 1 drop  1 drop Both Eyes QHS Gailen Shelter, [place]  1 drop at 04/22/21 0027   metFORMIN (GLUCOPHAGE) tablet 500 mg  500 mg Oral BID WC Solon Augusta S, PA   500 mg at 04/22/21 1039   metoprolol succinate (TOPROL-XL) 24 hr tablet 25 mg  25 mg Oral Daily Solon Augusta S, PA   25 mg at 04/22/21 1039   nicotine (NICODERM CQ - dosed in mg/24 hours) patch 14 mg  14 mg Transdermal Daily Solon Augusta S, PA   14 mg at 04/22/21 1039   ondansetron (ZOFRAN) tablet 4 mg  4 mg Oral Q6H PRN Solon Augusta S, PA       topiramate  (TOPAMAX) tablet 25 mg  25 mg Oral BID Solon Augusta S, PA   25 mg at 04/22/21 1040   traZODone (DESYREL) tablet 50 mg  50 mg Oral QHS PRN Gailen Shelter, PA       Current Outpatient Medications  Medication Sig Dispense Refill   acetaminophen (TYLENOL) 500 MG tablet Take 1 tablet (500 mg total) by mouth every 6 (six) hours as needed. 30 tablet 0   amLODipine (NORVASC) 5 MG tablet Take 5 mg by mouth daily.     apixaban (ELIQUIS) 5 MG TABS tablet Take 5 mg by mouth 2 (two) times daily.      ARIPiprazole (ABILIFY) 15 MG tablet Take 1 tablet (15 mg total) by mouth daily. 30 tablet 0   docusate sodium (COLACE) 100 MG capsule Take 400 mg by mouth at bedtime.      latanoprost (XALATAN) 0.005 % ophthalmic solution INSTILL 1 DROP INTO BOTH EYES EVERY NIGHT (Patient taking differently: Place 1 drop into both eyes at bedtime.) 7.5 mL 10   loratadine (CLARITIN) 10 MG tablet Take 10 mg by mouth daily as needed for allergies.     LORazepam (ATIVAN) 0.5 MG tablet Take 1 tablet (0.5 mg total) by mouth at bedtime. 30 tablet 0   LUMIGAN 0.01 % SOLN Place 1 drop into both eyes at bedtime.     metFORMIN (GLUCOPHAGE-XR) 500 MG 24 hr tablet Take 500 mg by mouth 2 (two) times daily.     metoprolol succinate (TOPROL-XL) 25 MG 24 hr tablet Take 1 tablet (25 mg total) by mouth daily. 30 tablet 0   Multiple Vitamin (MULTIVITAMIN WITH MINERALS) TABS tablet Take 1 tablet by mouth daily.     nicotine (NICODERM CQ - DOSED IN MG/24 HOURS) 14 mg/24hr patch Place 1 patch (14 mg total) onto the skin daily. 28 patch 0   ocrelizumab (OCREVUS) 300 MG/10ML injection Inject 2 vials ( 20 MLs) into the vein every 6 months (Patient taking differently: Inject 600 mg into the vein See admin instructions. Every 8 months) 20 mL 0   topiramate (TOPAMAX) 25 MG tablet TAKE ONE TABLET BY MOUTH TWICE A DAY (Patient taking differently: Take 25 mg by mouth 2 (two) times daily.) 60 tablet 4   traZODone (DESYREL) 100 MG tablet Take 200 mg by mouth  at bedtime.     cloZAPine (CLOZARIL) 100 MG tablet Take 1 tablet (100 mg total) by mouth every morning. (Patient not taking: No sig reported) 30 tablet 0   cloZAPine (CLOZARIL) 50 MG tablet Take 3 tablets (150 mg total) by mouth at bedtime. (Patient not taking: No sig reported) 30 tablet 0    Musculoskeletal: Strength & Muscle Tone: within normal limits Gait & Station: normal Patient leans: N/A  Psychiatric Specialty Exam:  Presentation  General Appearance: Appropriate for Environment  Eye Contact:Good  Speech:Clear and Coherent; Slow  Speech Volume:Normal  Handedness:Right   Mood and Affect  Mood:Dysphoric  Affect:Congruent   Thought Process  Thought Processes:Coherent  Descriptions of Associations:Circumstantial  Orientation:Full (Time, Place and Person)  Thought Content:WDL  History of Schizophrenia/Schizoaffective disorder:Yes  Duration of Psychotic Symptoms:Greater than six months  Hallucinations:Hallucinations: None Ideas of Reference:None  Suicidal Thoughts:Suicidal Thoughts: No Homicidal Thoughts:Homicidal Thoughts: No  Sensorium  Memory:Immediate Fair; Recent Fair  Judgment:Fair  Insight:Fair; Present   Executive Functions  Concentration:Fair  Attention Span:Fair  Recall:Fair  Fund of Knowledge:Fair  Language:Fair   Psychomotor Activity  Psychomotor Activity: Psychomotor Activity: Normal  Assets  Assets:Communication Skills; Housing; Leisure Time; Resilience; Social Support   Sleep  Sleep: Sleep: Good   Physical Exam: Physical Exam Vitals and nursing note reviewed. Exam conducted with a chaperone present.  Constitutional:      General: He is not in acute distress.    Appearance: Normal appearance. He is not ill-appearing.  Pulmonary:     Effort: Pulmonary effort is normal.  Neurological:     Mental Status: He is alert and oriented to person, place, and time.  Psychiatric:        Attention and Perception: Attention  normal. He perceives visual hallucinations. He does not perceive auditory hallucinations.        Mood and Affect: Affect is flat.        Speech: Speech is delayed.        Behavior: Behavior is cooperative.        Thought Content: Thought content normal. Thought content is not paranoid or delusional. Thought content does not include homicidal or suicidal ideation.        Judgment: Judgment is impulsive.   Review of Systems  Constitutional: Negative.   HENT: Negative.    Eyes: Negative.   Respiratory: Negative.    Cardiovascular: Negative.   Gastrointestinal: Negative.   Genitourinary: Negative.   Musculoskeletal: Negative.   Skin: Negative.   Neurological: Negative.   Endo/Heme/Allergies: Negative.   Psychiatric/Behavioral:  Negative for depression (Stable), substance abuse and suicidal ideas. Hallucinations: Denies.Nervous/anxious: Stable. Insomnia: Denies.   Blood pressure (!) 134/98, pulse 86, temperature 98.6 F (37 C), temperature source Oral, resp. rate 16, SpO2 100 %. There is no height or weight on file to calculate BMI.  Treatment Plan Summary: Plan psychiatrically cleared to follow-up with Dr. Jannifer Franklin for medication management  Disposition: Psychiatrically clear No evidence of imminent risk to self or others at present.   Patient does not meet criteria for psychiatric inpatient admission. Supportive therapy provided about ongoing stressors. Discussed crisis plan, support from social network, calling 911, coming to the Emergency Department, and calling Suicide Hotline.  This service was provided via telemedicine using a 2-way, interactive audio and video technology.  Names of all persons participating in this telemedicine service and their role in this encounter. Name: Assunta Found Role: NP   Name: Dr. Nelly Rout Role: Psychiatrist  Name: [name] Role: Patient  Name: Johny Blamer Role: Mother   Secure message sent to patient's nurse Deberah Pelton, RN  informing: Psychiatric consult complete and patient is psychiatrically cleared.  Patient will follow-up with Dr. Jannifer Franklin information has been added to discharge instructions.  [name] reports that she is patient's guardian but does not drive.  States patient is usually sent home in and [place]; states she is currently at her doctor's office but she should be home by 2 and okay with patient being sent home with transportation.  Please inform MD only default listed.  Dominick Zertuche  Ellisyn Icenhower, NP 04/22/2021 1:46 PM

## 2021-04-22 NOTE — Social Work (Signed)
CSW received consult for d/c transportation, however floor nurse has already procured a taxi voucher for Pt and is waiting for d/c orders.

## 2021-04-22 NOTE — ED Notes (Signed)
Security currently sitting with pt, as no safety sitters are available.

## 2021-04-22 NOTE — ED Provider Notes (Signed)
I was asked to evaluate the patient to make sure he was stable for discharge.  He is awake and alert.  He is eaten and ambulated without any difficulty.  He is comfortable plan for discharge to home.  Will discharge home by safe transport to mother.   Terrilee Files, MD 04/22/21 2056

## 2021-04-22 NOTE — ED Notes (Signed)
Pt is now awake but still appears drowsy. Pt was offered dinner at this time. Dr.Butler notified. MD states if pt becomes more awake and starts walking to notify him for reevaluation then discharge. VS stable. Safety precautions maintained. Will continue to monitor.

## 2021-04-22 NOTE — ED Notes (Signed)
Pt mother updated that pt is awake now and is started eating some. RN told mother that pt will be discharged to home when pt can walk on a steady gait and when pt becomes more awake. RN told pt mom that pt could be dc tonight or early AM tomorrow depending on pt improvement of mentation. Pt mom states she agrees on the plan as long as she gets notified prior to sending pt home so she could open the door. Pt mom also stated that safe transport will be a better transport plan as pt is a Media planner. Will let night shift RN know.

## 2021-04-22 NOTE — ED Notes (Signed)
Called pt mother at this time twice with no answer. Unable to leave a voicemail.

## 2021-04-22 NOTE — BH Assessment (Signed)
[facility] Assessment Progress Note   Per Shuvon Rankin, NP, this pt does not require psychiatric hospitalization at this time.  Pt is psychiatrically cleared.  Discharge instructions include recommendation to follow up with Thedore Mins, MD, pt's regular outpatient provider, at his earliest opportunity.  EDP Mancel Bale, MD and pt's nurse, Hunt Oris, have been notified.  Doylene Canning, MA Triage Specialist [phone]

## 2021-04-22 NOTE — ED Notes (Addendum)
Pt cleared by psych at this time. Psych updated regarding pt recent anger outburst today. Per psych, "Mother states around 2 and 3 PM he gets agitated daily; but she is going to follow up with Dr. Jannifer Franklin for medication management since he has been on so many. She is calling Dr. Jannifer Franklin today to see if she can move up appointment from next week or if he can make medication adjustments today and follow up next week." Pending discharge at this time. EDP notified and is aware.

## 2021-04-22 NOTE — ED Notes (Signed)
Pt placed in room for TTS assessment per TTS assessor.

## 2021-04-22 NOTE — ED Notes (Signed)
Dr. Charm Barges assessed pt and completed notice of commitment change to rescind the IVC. Completed paperwork faxed to magistrate at [phone].

## 2021-04-22 NOTE — ED Notes (Signed)
S/W Pt's mother, Billey Wojciak via telephone and updated on status. Currently waiting for [facility] transportation.

## 2021-04-22 NOTE — Discharge Instructions (Signed)
For your behavioral health needs you are advised to continue treatment with Thedore Mins, MD.  Contact him at your earliest opportunity to schedule an appointment:       [facility]      [address]      [place], [place] [address]      [phone]

## 2021-04-22 NOTE — ED Notes (Signed)
Pt still sedated at this time. Attempted to awaken pt with no success. Pt moves some on bed but falls back to sleep. No acute distress. Will continue to monitor.

## 2021-04-22 NOTE — ED Provider Notes (Signed)
Emergency Medicine Observation Re-evaluation Note  Brandon [name] is a 30 y.o. male, seen on rounds today.  Pt initially presented to the ED for complaints of Chest Pain and Psychiatric Evaluation Currently, the patient is lying on stretcher, after being medicated.  At 12:30 PM today he was agitated, screaming, minimally redirectable.  He was sedated for safety.  Physical Exam  BP (!) 134/98 (BP Location: Right Arm)   Pulse 86   Temp 98.6 F (37 C) (Oral)   Resp 16   SpO2 100%  Physical Exam General: Resting comfortably at 2:30 PM Cardiac: Normal heart rate Lungs: Normal respiratory rate Psych: Not responding to internal stimuli  ED Course / MDM  EKG:EKG Interpretation  Date/Time:  Saturday April 20 2021 22:48:47 EDT Ventricular Rate:  74 PR Interval:  132 QRS Duration: 90 QT Interval:  364 QTC Calculation: 404 R Axis:   -3 Text Interpretation: Normal sinus rhythm Cannot rule out Anterior infarct , age undetermined Abnormal ECG rate decreased from prior Confirmed by Benjiman Core [phone]) on 04/21/2021 11:09:48 AM  I have reviewed the labs performed to date as well as medications administered while in observation.  Recent changes in the last 24 hours include he has been seen and cleared by psychiatry.  Mother has been informed of this and plans on getting him outpatient follow-up with his psychiatrist.  Plan  Current plan is for discharge patient, will rescind IVC. Brandon [name] is not under involuntary commitment.      Mancel Bale, MD 04/22/21 2113

## 2021-04-22 NOTE — ED Notes (Signed)
Pt has been walking in his room. Currently eating his lunch. Will continue to monitor. Safety precautions maintained.

## 2021-04-23 NOTE — ED Notes (Signed)
Pt belongings sent with patient.

## 2021-04-23 NOTE — ED Notes (Signed)
S/W Pt's mother via telephone and advised that pt is on his way home.

## 2021-05-12 ENCOUNTER — Ambulatory Visit (HOSPITAL_COMMUNITY)
Admission: EM | Admit: 2021-05-12 | Discharge: 2021-05-13 | Disposition: A | Discharge status: Home / Self Care | Payer: Medicare Other | Attending: Family | Admitting: Family

## 2021-05-12 DIAGNOSIS — F201 Disorganized schizophrenia: Secondary | ICD-10-CM

## 2021-05-12 DIAGNOSIS — Z560 Unemployment, unspecified: Secondary | ICD-10-CM | POA: Insufficient documentation

## 2021-05-12 DIAGNOSIS — R443 Hallucinations, unspecified: Secondary | ICD-10-CM

## 2021-05-12 DIAGNOSIS — F17210 Nicotine dependence, cigarettes, uncomplicated: Secondary | ICD-10-CM | POA: Diagnosis not present

## 2021-05-12 DIAGNOSIS — Z20822 Contact with and (suspected) exposure to covid-19: Secondary | ICD-10-CM | POA: Insufficient documentation

## 2021-05-12 DIAGNOSIS — R451 Restlessness and agitation: Secondary | ICD-10-CM

## 2021-05-12 LAB — POCT URINE DRUG SCREEN - MANUAL ENTRY (I-SCREEN)
POC Amphetamine UR: NOT DETECTED
POC Buprenorphine (BUP): NOT DETECTED
POC Cocaine UR: NOT DETECTED
POC Marijuana UR: NOT DETECTED
POC Methadone UR: NOT DETECTED
POC Methamphetamine UR: NOT DETECTED
POC Morphine: NOT DETECTED
POC Oxazepam (BZO): POSITIVE — AB
POC Oxycodone UR: NOT DETECTED
POC Secobarbital (BAR): NOT DETECTED

## 2021-05-12 LAB — RESP PANEL BY RT-PCR (FLU A&B, COVID) ARPGX2
Influenza A by PCR: NEGATIVE
Influenza B by PCR: NEGATIVE
SARS Coronavirus 2 by RT PCR: NEGATIVE

## 2021-05-12 LAB — POC SARS CORONAVIRUS 2 AG: SARSCOV2ONAVIRUS 2 AG: NEGATIVE

## 2021-05-12 LAB — POC SARS CORONAVIRUS 2 AG -  ED: SARS Coronavirus 2 Ag: NEGATIVE

## 2021-05-12 MED ORDER — TRAZODONE HCL 50 MG PO TABS: 50.0000 mg | ORAL_TABLET | Freq: Every evening | ORAL | Status: DC | PRN

## 2021-05-12 MED ORDER — ALUM & MAG HYDROXIDE-SIMETH 200-200-20 MG/5ML PO SUSP: 30.0000 mL | ORAL | Status: DC | PRN

## 2021-05-12 MED ORDER — MAGNESIUM HYDROXIDE 400 MG/5ML PO SUSP: 30.0000 mL | Freq: Every day | ORAL | Status: DC | PRN

## 2021-05-12 MED ORDER — ACETAMINOPHEN 325 MG PO TABS: 650.0000 mg | ORAL_TABLET | Freq: Four times a day (QID) | ORAL | Status: DC | PRN

## 2021-05-12 MED ORDER — TOPIRAMATE 25 MG PO TABS: 25.0000 mg | ORAL_TABLET | Freq: Once | ORAL | Status: DC

## 2021-05-12 MED ORDER — AMLODIPINE BESYLATE 5 MG PO TABS: 5.0000 mg | ORAL_TABLET | Freq: Once | ORAL | Status: DC

## 2021-05-12 MED ORDER — ARIPIPRAZOLE 15 MG PO TABS: 15.0000 mg | ORAL_TABLET | Freq: Once | ORAL | Status: DC

## 2021-05-12 MED ORDER — AMANTADINE HCL 100 MG PO CAPS
100.0000 mg | ORAL_CAPSULE | Freq: Two times a day (BID) | ORAL | Status: DC
  Administered 2021-05-12 – 2021-05-13 (×2): 100 mg via ORAL
  Filled 2021-05-12 (×2): qty 1

## 2021-05-12 MED ORDER — APIXABAN 5 MG PO TABS: 5.0000 mg | ORAL_TABLET | Freq: Once | ORAL | Status: DC

## 2021-05-12 MED ORDER — METFORMIN HCL 500 MG PO TABS
500.0000 mg | ORAL_TABLET | Freq: Two times a day (BID) | ORAL | Status: DC
  Administered 2021-05-13: 500 mg via ORAL
  Filled 2021-05-12: qty 1

## 2021-05-12 MED ORDER — TRAZODONE HCL 100 MG PO TABS
100.0000 mg | ORAL_TABLET | Freq: Every evening | ORAL | Status: DC | PRN
  Administered 2021-05-12: 100 mg via ORAL
  Filled 2021-05-12: qty 1

## 2021-05-12 MED ORDER — LORAZEPAM 1 MG PO TABS
2.0000 mg | ORAL_TABLET | Freq: Once | ORAL | Status: AC
  Administered 2021-05-12: 2 mg via ORAL
  Filled 2021-05-12: qty 2

## 2021-05-12 MED ORDER — METOPROLOL TARTRATE 25 MG PO TABS: 25.0000 mg | ORAL_TABLET | Freq: Once | ORAL | Status: DC

## 2021-05-12 MED ORDER — LATANOPROST 0.005 % OP SOLN
1.0000 [drp] | Freq: Every day | OPHTHALMIC | Status: DC
  Filled 2021-05-12 (×2): qty 2.5

## 2021-05-12 MED ORDER — HYDROXYZINE HCL 25 MG PO TABS
25.0000 mg | ORAL_TABLET | Freq: Three times a day (TID) | ORAL | Status: DC | PRN
  Administered 2021-05-13: 25 mg via ORAL
  Filled 2021-05-12: qty 1

## 2021-05-12 NOTE — ED Notes (Signed)
Patient admitted to [facility] for overnight observation voluntarily. Patient denies SI, HI but has history of auditory and visual hallucinations. Patient oriented to unit, given meal for dinner, toileting offered. Patient denies pain at this time. Nursing staff will continue to monitor.

## 2021-05-12 NOTE — BH Assessment (Signed)
Comprehensive Clinical Assessment (CCA) Note  05/12/2021 CLOUDE BITTEL [id number]  The patient demonstrates the following risk factors for suicide: Chronic risk factors for suicide include: psychiatric disorder of Schizophrenia . Acute risk factors for suicide include: N/A. Protective factors for this patient include: positive social support and positive therapeutic relationship. Considering these factors, the overall suicide risk at this point appears to be low. Patient is appropriate for outpatient follow up.  [place] ED from 04/20/2021 in [place] Admission (Discharged) from 03/05/2021 in [place] [facility] ED from 03/04/2021 in [place] No Risk No Risk No Risk      Pt is a 30 yo male who was brought in by [facility] after a call from his mother to 911. Mother/L Guardian was contacted by Ricky Ala, NP. Per mother, pt was agitated, aggressive and hitting the wall in their home. Per mom, pt has had a recent medication change by his psychiatrist, Dr. [name], about 2 weeks ago. Mother also stated he had been responding to internal stimuli. Hx of Schizophrenia, IDD and IP psych admissions. Pt was very slow to respond to questions asked. Pt stated he was not thinking of hurting himself or others and stated today, for the first time, he began "seeing things." He did not elaborate with any further details.  Pt was casually dressed and seemed adequately groomed. He spoke and moved slowly and seemed a bit incoherent. Pt seemed oriented to self and situation but it was unclear whether pt was oriented to place and time. Pt was unable to answer questions for the Depression Screening exam due to symptoms of his condition.  Per 04/21/21 Assessment: Brandon [name] is a 30 y.o. male. with past medical history significant for disorganized schizophrenia. Legal guardian is his  Mother, Jedd, [place], [phone]. He was seen face to face by this Clinician. Upon chart review patient admitted to [facility]: 03/25/2021, 03/20/2021, 03/15/2021, 03/11/2021, 03/06/2021, 03/05/2021. Also, has 5 Urgent Care visits in 2020. Patient's visits all have similar complaints. His complaint today is, "I just came here to clear my head, last night I blanked out for no reason". Patient unable to described the event that occurred. He was mostly starring at the camera and not responding to questions. Clinician asked questions 3 times or before patient respondent. However, when patient responded his responses were intermittently incomprehensible. He says that the incident occurred at home where he lives with his documented Legal guardian. Mother, [phone]. Patient however says that he doesn't have a guardian. Patient denies current suicidal ideations. Also, denies making any statements and/or gestures to warrant concerns for his safety. Denies current stressors. However, acknowledges feeling stressed in the past due to "thinking about my past relationships". Denies a family hx of mental health illnesses. Denies access to means such as firearms. Denies a hx of trauma and/or abuse. Appetite and sleep are poor Patient denies homicidal ideations. Denies hx of aggressive and/or assaultive behaviors. No plan and/or intent. No legal issues. Denies AVH's. Denies alcohol and/or drug use. Patient with auditory hallucinations that tell him, "If you don't kill yourself, I'm going to knock you out". Patient asked about visual hallucinations and patient does not respond, appears to have fallen asleep. Patient's name called multiple times and he stares blankly into the camera. During evaluation [name] is sitting up in chair in no acute distress. He is alert/oriented person and place. Currently he is calm and cooperative;  He appears to be paranoid with blunt affect. He is speaking intermittent in a clear tone at low volume,  and normal pace; with minimal eye contact. His thought process is disorganized. Patient denies auditory/visual hallucinations but is evident that he is currently responding to responding to internal stimuli.   Chief Complaint:  Chief Complaint  Patient presents with   Schizophrenia   Visit Diagnosis:  Schizophrenia    CCA Screening, Triage and Referral (STR)  Patient Reported Information How did you hear about [place]? Family/Friend  What Is the Reason for Your Visit/Call Today? Pt is a 30 yo male who was brought in by [facility] after a call from his mother to 911. Per mother, pt was agitated, aggressive and hitting the wall in their home. Per mom, pt has had a recent medication change by his psychiatrist, Dr. [name], about 2 weeks ago. Mother also stated he had been responding to internal stimuli. Hx of Schizophrenia, IDD and IP psych admissions. Pt was very slow to respond to questions asked. Pt stated he was not thinking of hurting himself or others and stated today, for the first time, he began "seeing things." He did not elaborate with any further details.  How Long Has This Been Causing You Problems? > than 6 months  What Do You Feel Would Help You the Most Today? Medication(s); Treatment for Depression or other mood problem   Have You Recently Had Any Thoughts About Hurting Yourself? No  Are You Planning to Commit Suicide/Harm Yourself At This time? No   Have you Recently Had Thoughts About [place]? No  Are You Planning to Harm Someone at This Time? No  Explanation: No data recorded  Have You Used Any Alcohol or Drugs in the Past 24 Hours? No  How Long Ago Did You Use Drugs or Alcohol? No data recorded What Did You Use and How Much? No data recorded  Do You Currently Have a Therapist/Psychiatrist? Yes  Name of Therapist/Psychiatrist: Dr. [name]   Have You Been Recently Discharged From Any Office Practice or Programs? No  Explanation of Discharge From  Practice/Program: No data recorded    CCA Screening Triage Referral Assessment Type of Contact: Tele-Assessment  Telemedicine Service Delivery:   Is this Initial or Reassessment? Initial Assessment  Date Telepsych consult ordered in [facility]:  04/21/21  Time Telepsych consult ordered in [facility]:  1326  Location of Assessment: [facility] ED  Provider Location: [facility]   Collateral Involvement: Legal guardian. Mother, [phone], [place]   Does Patient Have a Court Appointed Legal Guardian? No data recorded Name and Contact of Legal Guardian: No data recorded If Minor and Not Living with Parent(s), Who has Custody? n/a  Is CPS involved or ever been involved? Never  Is APS involved or ever been involved? Never   Patient Determined To Be At Risk for Harm To Self or Others Based on Review of Patient Reported Information or Presenting Complaint? No  Method: No data recorded Availability of Means: No data recorded Intent: No data recorded Notification Required: No data recorded Additional Information for Danger to Others Potential: No data recorded Additional Comments for Danger to Others Potential: No data recorded Are There Guns or Other Weapons in Your Home? No data recorded Types of Guns/Weapons: No data recorded Are These Weapons Safely Secured?                            No data recorded Who Could  Verify You Are Able To Have These Secured: No data recorded Do You Have any Outstanding Charges, Pending Court Dates, Parole/Probation? No data recorded Contacted To Inform of Risk of Harm To Self or Others: No data recorded   Does Patient Present under Involuntary Commitment? Yes  IVC Papers Initial File Date: 03/05/21   [place] of Residence: [place]   Patient Currently Receiving the Following Services: Medication Management (Currently in a day program)   Determination of Need: Urgent (48 hours) (Per Hillery Jacks NP, pt is recommended for OBS unit at  [facility])   Options For Referral: [facility]; Other: Comment (OBS unit)     CCA Biopsychosocial Patient Reported Schizophrenia/Schizoaffective Diagnosis in Past: Yes   Strengths: UTA   Mental Health Symptoms Depression:   -- (uta)   Duration of Depressive symptoms:    Mania:   Racing thoughts   Anxiety:    Difficulty concentrating   Psychosis:   Grossly disorganized or catatonic behavior; Hallucinations   Duration of Psychotic symptoms:  Duration of Psychotic Symptoms: Greater than six months   Trauma:   N/A   Obsessions:   N/A   Compulsions:   N/A   Inattention:   N/A   Hyperactivity/Impulsivity:   N/A   Oppositional/Defiant Behaviors:   N/A   Emotional Irregularity:   Mood lability   Other Mood/Personality Symptoms:   N/A    Mental Status Exam Appearance and self-care  Stature:   Tall   Weight:   Overweight   Clothing:   Disheveled   Grooming:   Neglected   Cosmetic use:   None   Posture/gait:   Slumped   Motor activity:   Slowed   Sensorium  Attention:   Distractible; Confused   Concentration:   Scattered   Orientation:   Person; Place; Situation   Recall/memory:   Defective in Immediate; Defective in Short-term   Affect and Mood  Affect:   Flat   Mood:   Euthymic   Relating  Eye contact:   Fleeting   Facial expression:   Constricted   Attitude toward examiner:   Cooperative; Guarded   Thought and Language  Speech flow:  Garbled; Paucity; Slurred; Slow   Thought content:   Appropriate to Mood and Circumstances   Preoccupation:   None (none observed)   Hallucinations:   Auditory; Visual   Organization:  No data recorded  [employer] of Knowledge:   Poor   Intelligence:   Needs investigation (Per chart IDD)   Abstraction:   Abstract   Judgement:   Impaired   Reality Testing:   Distorted   Insight:   Lacking   Decision Making:   Impulsive   Social Functioning   Social Maturity:   Impulsive   Social Judgement:   Heedless   Stress  Stressors:   Family conflict; Illness   Coping Ability:   Overwhelmed   Skill Deficits:   Activities of daily living; Decision making; Self-control; Responsibility   Supports:   Family; Friends/Service system     Religion: Religion/Spirituality Are You A Religious Person?: No How Might This Affect Treatment?: N/A  Leisure/Recreation: Leisure / Recreation Do You Have Hobbies?: Yes Leisure and Hobbies: Video games  Exercise/Diet: Exercise/Diet Do You Exercise?:  (uta) Have You Gained or Lost A Significant Amount of Weight in the Past Six Months?:  (uta) Do You Follow a Special Diet?:  (uta) Do You Have Any Trouble Sleeping?:  (uta)   CCA Employment/Education Employment/Work Situation: Employment /  Work Situation Employment Situation: On disability Why is Patient on Disability: MS and MH How Long has Patient Been on Disability: Not assessed. Patient's Job has Been Impacted by Current Illness: No Has Patient ever Been in the [place]?: No  Education: Education Did Physicist, medical?: No Did You Have Any Difficulty At School?: No   CCA Family/Childhood History Family and Relationship History: Family history Marital status: Single Does patient have children?: No  Childhood History:  Childhood History By whom was/is the patient raised?: Mother Did patient suffer any verbal/emotional/physical/sexual abuse as a child?: No Did patient suffer from severe childhood neglect?: No Has patient ever been sexually abused/assaulted/raped as an adolescent or adult?: No Was the patient ever a victim of a crime or a disaster?: No Witnessed domestic violence?: No Has patient been affected by domestic violence as an adult?: No  Child/Adolescent Assessment:     CCA Substance Use Alcohol/Drug Use: Alcohol / Drug Use Pain Medications: See MAR Prescriptions: See MAR Over the Counter: See  MAR History of alcohol / drug use?: No history of alcohol / drug abuse                         ASAM's:  Six Dimensions of Multidimensional Assessment  Dimension 1:  Acute Intoxication and/or Withdrawal Potential:      Dimension 2:  Biomedical Conditions and Complications:      Dimension 3:  Emotional, Behavioral, or Cognitive Conditions and Complications:     Dimension 4:  Readiness to Change:     Dimension 5:  Relapse, Continued use, or Continued Problem Potential:     Dimension 6:  Recovery/Living Environment:     ASAM Severity Score:    ASAM Recommended Level of Treatment:     Substance use Disorder (SUD)    Recommendations for Services/Supports/Treatments: Recommendations for Services/Supports/Treatments Recommendations For Services/Supports/Treatments: Medication Management, Facility Based Crisis  Discharge Disposition:    DSM5 Diagnoses: Patient Active Problem List   Diagnosis Date Noted   Agitation 04/22/2021   Schizophrenia, chronic with acute exacerbation ([place]) 03/13/2021   Schizophrenia, 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[place]) 03/05/2021   Falls 09/13/2020   Unspecified intellectual disabilities 01/27/2020   Migraine headache 04/18/2019   Generalized weakness    History of pulmonary embolism 08/12/2017   Multiple sclerosis exacerbation ([place]) 07/30/2017   Left-sided weakness 01/18/2014   Multiple sclerosis ([place]) 05/20/2013   Unsteady gait 11/23/2012   HTN (hypertension) 07/04/2011   Ataxia 07/02/2011   Obesity, Class III, BMI 40-49.9 (morbid obesity) ([place]) 01/14/2010   ADHD 01/14/2010     Referrals to Alternative Service(s): Referred to Alternative Service(s):   Place:   Date:   Time:    Referred to Alternative Service(s):   Place:   Date:   Time:    Referred to Alternative Service(s):   Place:   Date:   Time:    Referred to Alternative Service(s):   Place:   Date:   Time:     Zarria Towell T, Counselor''  Namiko Pritts T. Mare Ferrari, [place], [facility], [facility] Triage Specialist [facility]

## 2021-05-12 NOTE — ED Notes (Signed)
Pt asked for something to given. Pt was given chips, cranberry juice and one of the warmed parmesan dinners

## 2021-05-12 NOTE — ED Provider Notes (Addendum)
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  Patient has a charted history with schizophrenia, agitation and IDD. Brandon [name] is  reporting responding to visual hallucinations. Stated that is symptom is new. He is currently denying suicidal or homicidal  ideations.   NP spoke to patient's mother  Producer, television/film/video) for additional collateral who states " he is tearing up my house, talking to things and people who are not there." Stated that  patient is currently followed by [facility],  Dr. [name] where his medications was recently adjusted 2 weeks ago.     During evaluation Brandon [name] is sitting  in no acute distress. He is alert/oriented x 4; calm/cooperative and dysphoric. [name] presents with delayed speech  and slow to respond during this assessment. Patient has  with good eye contact. His thoughts and process is blocked. Will recommend overnight observation.    PHQ 2-9:   Flowsheet Row ED from 04/20/2021 in [place] Admission (Discharged) from 03/05/2021 in [place] [facility] ED from 03/04/2021 in [place] No Risk No Risk No Risk        Total Time spent with patient: 15 minutes  Musculoskeletal  Strength & Muscle Tone: within normal limits Gait & Station: normal Patient leans: N/A  Psychiatric Specialty Exam  Presentation General Appearance: Appropriate for Environment  Eye Contact:Good  Speech:Clear and Coherent; Slow  Speech Volume:Normal  Handedness:Right   Mood and Affect   Mood:Dysphoric  Affect:Congruent   Thought Process  Thought Processes:Coherent  Descriptions of Associations:Circumstantial  Orientation:Full (Time, Place and Person)  Thought Content:WDL  Diagnosis of Schizophrenia or Schizoaffective disorder in past: Yes  Duration of Psychotic Symptoms: Greater than six months  Hallucinations:No data recorded Ideas of Reference:None  Suicidal Thoughts:No data recorded Homicidal Thoughts:No data recorded  Sensorium  Memory:Immediate Fair; Recent Fair  Judgment:Fair  Insight:Fair; Present   Executive Functions  Concentration:Fair  Attention Span:Fair  [place]   Psychomotor Activity  Psychomotor Activity:No data recorded  Assets  Assets:Communication Skills; Housing; Leisure Time; Resilience; Social Support   Sleep  Sleep:No data recorded  No data recorded  Physical Exam Vitals and nursing note reviewed.  HENT:     Head: Normocephalic.  Cardiovascular:     Rate and Rhythm: Normal rate and regular rhythm.     Pulses: Normal pulses.  Neurological:     General: No focal deficit present.     Mental Status: He is alert and oriented to person, place, and time.  Psychiatric:        Attention and Perception: Attention normal. He perceives visual hallucinations.        Mood and Affect: Mood is anxious. Affect is flat.        Speech: Speech is delayed.        Behavior: Behavior normal. Aggressive: was reported he was aggressive and responding to internal stumili this evenign.        Thought Content: Thought content normal.        Cognition and Memory: Cognition is impaired.        Judgment: Judgment normal.  ROS  Blood pressure (!) 134/96, pulse 88, temperature 98.2 F (36.8 C), temperature source Oral, resp. rate 18, SpO2 100 %. There is no height or weight on file to calculate BMI.  Past Psychiatric History:    Is the patient at risk to self? Yes  Has the patient  been a risk to self in the past 6 months? No .    Has the patient been a risk to self within the distant past? No   Is the patient a risk to others? No   Has the patient been a risk to others in the past 6 months? No   Has the patient been a risk to others within the distant past? No   Past Medical History:  Past Medical History:  Diagnosis Date   ADHD (attention deficit hyperactivity disorder)    Bipolar 1 disorder (HCC)    Chronic back pain    Chronic constipation    Chronic neck pain    Hypertension    Multiple sclerosis ([place]) 05/20/2013   left sided weakness, dysarthria   Non-compliance    Obesity    Pulmonary embolism ([place])    Schizophrenia (HCC)    Stroke ([place])    left sided deficits - pt's mother denies this    Past Surgical History:  Procedure Laterality Date   NO PAST SURGERIES     None     RADIOLOGY WITH ANESTHESIA N/A 01/16/2021   Procedure: MRI WITH ANESTHESIA CERVICAL AND THORASIC SPINE WITH AND WITHOUT CONTRAST;  Surgeon: Radiologist, Medication, MD;  Location: [place];  Service: Radiology;  Laterality: N/A;   TOOTH EXTRACTION N/A 06/24/2019   Procedure: DENTAL RESTORATION/EXTRACTION OF TEETH NUMBER ONE, SIXTEEN, SEVENTEEN, NINETEEN, THIRTY-TWO;  Surgeon: Diona Browner, DDS;  Location: [place];  Service: Oral Surgery;  Laterality: N/A;    Family History:  Family History  Problem Relation Age of Onset   Diabetes Mother    ADD / ADHD Brother     Social History:  Social History   Socioeconomic History   Marital status: Single    Spouse name: Not on file   Number of children: 0   Years of education: 11th   Highest education level: Not on file  Occupational History   Occupation: unemployed    Employer: [employer]    Comment: Disbaled  Tobacco Use   Smoking status: Every Day    Packs/day: 0.25    Types: Cigarettes   Smokeless tobacco: Never   Tobacco comments:    2 cigarettes a day  Vaping Use   Vaping Use: Never used  Substance and Sexual  Activity   Alcohol use: Not Currently    Alcohol/week: 0.0 standard drinks    Comment: "A little bit"    Drug use: Not Currently    Types: Marijuana    Comment: Last used: unknown    Sexual activity: Not on file  Other Topics Concern   Not on file  Social History Narrative   Patient lives at home with his mother.   Disabled.   Education 11 th grade .   Right handed.   Drinks caffeine occassionally   Social Determinants of Radio broadcast assistant Strain: Not on file  Food Insecurity: Not on file  Transportation Needs: Not on file  Physical Activity: Not on file  Stress: Not on file  Social Connections: Not on file  Intimate Partner Violence: Not on file    SDOH:  SDOH Screenings   Alcohol Screen: Low Risk  Last Alcohol Screening Score (AUDIT): 0  Depression (PHQ2-9): Not on file  Financial Resource Strain: Not on file  Food Insecurity: Not on file  Housing: Not on file  Physical Activity: Not on file  Social Connections: Not on file  Stress: Not on file  Tobacco Use: High Risk   Smoking Tobacco Use: Every Day   Smokeless Tobacco Use: Never   Passive Exposure: Not on file  Transportation Needs: Not on file    Last Labs:  Admission on 05/12/2021  Component Date Value Ref Range Status   POC Amphetamine UR 05/12/2021 None Detected  NONE DETECTED (Cut Off Level 1000 ng/mL) Final   POC Secobarbital (BAR) 05/12/2021 None Detected  NONE DETECTED (Cut Off Level 300 ng/mL) Final   POC Buprenorphine (BUP) 05/12/2021 None Detected  NONE DETECTED (Cut Off Level 10 ng/mL) Final   POC Oxazepam (BZO) 05/12/2021 Positive (A)  NONE DETECTED (Cut Off Level 300 ng/mL) Final   POC Cocaine UR 05/12/2021 None Detected  NONE DETECTED (Cut Off Level 300 ng/mL) Final   POC Methamphetamine UR 05/12/2021 None Detected  NONE DETECTED (Cut Off Level 1000 ng/mL) Final   POC Morphine 05/12/2021 None Detected  NONE DETECTED (Cut Off Level 300 ng/mL) Final   POC Oxycodone UR 05/12/2021  None Detected  NONE DETECTED (Cut Off Level 100 ng/mL) Final   POC Methadone UR 05/12/2021 None Detected  NONE DETECTED (Cut Off Level 300 ng/mL) Final   POC Marijuana UR 05/12/2021 None Detected  NONE DETECTED (Cut Off Level 50 ng/mL) Final   SARS Coronavirus 2 Ag 05/12/2021 Negative  Negative Final   SARSCOV2ONAVIRUS 2 AG 05/12/2021 NEGATIVE  NEGATIVE Final   Comment: (NOTE) SARS-CoV-2 antigen NOT DETECTED.   Negative results are presumptive.  Negative results do not preclude SARS-CoV-2 infection and should not be used as the sole basis for treatment or other patient management decisions, including infection  control decisions, particularly in the presence of clinical signs and  symptoms consistent with COVID-19, or in those who have been in contact with the virus.  Negative results must be combined with clinical observations, patient history, and epidemiological information. The expected result is Negative.  Fact Sheet for Patients: HandmadeRecipes.com.cy  Fact Sheet for Healthcare Providers: FuneralLife.at  This test is not yet approved or cleared by the [place] FDA and  has been authorized for detection and/or diagnosis of SARS-CoV-2 by FDA under an Emergency Use Authorization (EUA).  This EUA will remain in effect (meaning this test can be used) for the duration of  the COV                          ID-19 declaration under Section 564(b)(1) of the Act, 21 U.S.C. section 360bbb-3(b)(1), unless the authorization is terminated or revoked sooner.    Admission on 04/20/2021, Discharged on 04/23/2021  Component Date Value Ref Range Status   WBC 04/20/2021 8.0  4.0 - 10.5 K/uL Final   RBC 04/20/2021 5.01  4.22 - 5.81 MIL/uL Final   Hemoglobin 04/20/2021 13.0  13.0 - 17.0 g/dL Final   HCT 04/20/2021 41.8  39.0 - 52.0 % Final   MCV 04/20/2021 83.4  80.0 - 100.0 fL Final   MCH 04/20/2021 25.9 (A)  26.0 - 34.0 pg Final   MCHC  04/20/2021 31.1  30.0 - 36.0 g/dL Final   RDW 04/20/2021 14.6  11.5 - 15.5 % Final   Platelets 04/20/2021 225  150 - 400 K/uL Final  nRBC 04/20/2021 0.0  0.0 - 0.2 % Final   Neutrophils Relative % 04/20/2021 66  % Final   Neutro Abs 04/20/2021 5.3  1.7 - 7.7 K/uL Final   Lymphocytes Relative 04/20/2021 23  % Final   Lymphs Abs 04/20/2021 1.8  0.7 - 4.0 K/uL Final   Monocytes Relative 04/20/2021 7  % Final   Monocytes Absolute 04/20/2021 0.6  0.1 - 1.0 K/uL Final   Eosinophils Relative 04/20/2021 2  % Final   Eosinophils Absolute 04/20/2021 0.2  0.0 - 0.5 K/uL Final   Basophils Relative 04/20/2021 1  % Final   Basophils Absolute 04/20/2021 0.1  0.0 - 0.1 K/uL Final   Immature Granulocytes 04/20/2021 1  % Final   Abs Immature Granulocytes 04/20/2021 0.04  0.00 - 0.07 K/uL Final   Performed at [facility], [place] [address] [place], [place] [address]   Sodium 04/20/2021 138  135 - 145 mmol/L Final   Potassium 04/20/2021 3.5  3.5 - 5.1 mmol/L Final   Chloride 04/20/2021 107  98 - 111 mmol/L Final   CO2 04/20/2021 23  22 - 32 mmol/L Final   Glucose, Bld 04/20/2021 94  70 - 99 mg/dL Final   Glucose reference range applies only to samples taken after fasting for at least 8 hours.   BUN 04/20/2021 16  6 - 20 mg/dL Final   Creatinine, Ser 04/20/2021 1.00  0.61 - 1.24 mg/dL Final   Calcium 04/20/2021 9.2  8.9 - 10.3 mg/dL Final   GFR, Estimated 04/20/2021 >60  >60 mL/min Final   Comment: (NOTE) Calculated using the CKD-EPI Creatinine Equation (2021)    Anion gap 04/20/2021 8  5 - 15 Final   Performed at [facility], [place] [address] [place], [place] [address]   Troponin I (High Sensitivity) 04/20/2021 4  <18 ng/L Final   Comment: (NOTE) Elevated high sensitivity troponin I (hsTnI) values and significant  changes across serial measurements may suggest ACS but many other  chronic and acute conditions are known to elevate hsTnI results.  Refer to the Links section for chest pain  algorithms and additional  guidance. Performed at [facility], [place] [address], [place], [place] [address]    Troponin I (High Sensitivity) 04/21/2021 4  <18 ng/L Final   Comment: (NOTE) Elevated high sensitivity troponin I (hsTnI) values and significant  changes across serial measurements may suggest ACS but many other  chronic and acute conditions are known to elevate hsTnI results.  Refer to the "Links" section for chest pain algorithms and additional  guidance. Performed at [facility], [place] [address], [place], [place] [address]    SARS Coronavirus 2 by RT PCR 04/21/2021 NEGATIVE  NEGATIVE Final   Comment: (NOTE) SARS-CoV-2 target nucleic acids are NOT DETECTED.  The SARS-CoV-2 RNA is generally detectable in upper respiratory specimens during the acute phase of infection. The lowest concentration of SARS-CoV-2 viral copies this assay can detect is 138 copies/mL. A negative result does not preclude SARS-Cov-2 infection and should not be used as the sole basis for treatment or other patient management decisions. A negative result may occur with  improper specimen collection/handling, submission of specimen other than nasopharyngeal swab, presence of viral mutation(s) within the areas targeted by this assay, and inadequate number of viral copies(<138 copies/mL). A negative result must be combined with clinical observations, patient history, and epidemiological information. The expected result is Negative.  Fact Sheet for Patients:  EntrepreneurPulse.com.au  Fact  Sheet for Healthcare Providers:  SeriousBroker.it  This test is no                          t yet approved or cleared by the [place] FDA and  has been authorized for detection and/or diagnosis of SARS-CoV-2 by FDA under an Emergency Use Authorization (EUA). This EUA will remain  in effect (meaning this test can be used) for the duration of the COVID-19  declaration under Section 564(b)(1) of the Act, 21 U.S.C.section 360bbb-3(b)(1), unless the authorization is terminated  or revoked sooner.       Influenza A by PCR 04/21/2021 NEGATIVE  NEGATIVE Final   Influenza B by PCR 04/21/2021 NEGATIVE  NEGATIVE Final   Comment: (NOTE) The Xpert Xpress SARS-CoV-2/FLU/RSV plus assay is intended as an aid in the diagnosis of influenza from Nasopharyngeal swab specimens and should not be used as a sole basis for treatment. Nasal washings and aspirates are unacceptable for Xpert Xpress SARS-CoV-2/FLU/RSV testing.  Fact Sheet for Patients: BloggerCourse.com  Fact Sheet for Healthcare Providers: SeriousBroker.it  This test is not yet approved or cleared by the [place] FDA and has been authorized for detection and/or diagnosis of SARS-CoV-2 by FDA under an Emergency Use Authorization (EUA). This EUA will remain in effect (meaning this test can be used) for the duration of the COVID-19 declaration under Section 564(b)(1) of the Act, 21 U.S.C. section 360bbb-3(b)(1), unless the authorization is terminated or revoked.  Performed at [facility], [address] [place], [place] [address]    Alcohol, Ethyl (B) 04/21/2021 <10  <10 mg/dL Final   Comment: (NOTE) Lowest detectable limit for serum alcohol is 10 mg/dL.  For medical purposes only. Performed at [facility], [address] [place], [place] [address]    Salicylate Lvl 04/21/2021 <7.0 (A)  7.0 - 30.0 mg/dL Final   Performed at [facility], [address], [place], [place] [address]   Acetaminophen (Tylenol), Serum 04/21/2021 <10 (A)  10 - 30 ug/mL Final   Comment: (NOTE) Therapeutic concentrations vary significantly. A range of 10-30 ug/mL  may be an effective concentration for many patients. However, some  are best treated at concentrations outside of this range. Acetaminophen concentrations >150 ug/mL at 4  hours after ingestion  and >50 ug/mL at 12 hours after ingestion are often associated with  toxic reactions.  Performed at [facility], [address], [place], [place] [address]    Glucose-Capillary 04/22/2021 70  70 - 99 mg/dL Final   Glucose reference range applies only to samples taken after fasting for at least 8 hours.  No results displayed because visit has over 200 results.    Admission on 03/04/2021, Discharged on 03/05/2021  Component Date Value Ref Range Status   SARS Coronavirus 2 by RT PCR 03/04/2021 NEGATIVE  NEGATIVE Final   Comment: (NOTE) SARS-CoV-2 target nucleic acids are NOT DETECTED.  The SARS-CoV-2 RNA is generally detectable in upper respiratory specimens during the acute phase of infection. The lowest concentration of SARS-CoV-2 viral copies this assay can detect is 138 copies/mL. A negative result does not preclude SARS-Cov-2 infection and should not be used as the sole basis for treatment or other patient management decisions. A negative result may occur with  improper specimen collection/handling, submission of specimen other than nasopharyngeal swab, presence of viral mutation(s) within the areas targeted by this assay, and inadequate number of viral copies(<138 copies/mL).  A negative result must be combined with clinical observations, patient history, and epidemiological information. The expected result is Negative.  Fact Sheet for Patients:  EntrepreneurPulse.com.au  Fact Sheet for Healthcare Providers:  IncredibleEmployment.be  This test is no                          t yet approved or cleared by the [place] FDA and  has been authorized for detection and/or diagnosis of SARS-CoV-2 by FDA under an Emergency Use Authorization (EUA). This EUA will remain  in effect (meaning this test can be used) for the duration of the COVID-19 declaration under Section 564(b)(1) of the Act, 21 U.S.C.section  360bbb-3(b)(1), unless the authorization is terminated  or revoked sooner.       Influenza A by PCR 03/04/2021 NEGATIVE  NEGATIVE Final   Influenza B by PCR 03/04/2021 NEGATIVE  NEGATIVE Final   Comment: (NOTE) The Xpert Xpress SARS-CoV-2/FLU/RSV plus assay is intended as an aid in the diagnosis of influenza from Nasopharyngeal swab specimens and should not be used as a sole basis for treatment. Nasal washings and aspirates are unacceptable for Xpert Xpress SARS-CoV-2/FLU/RSV testing.  Fact Sheet for Patients: EntrepreneurPulse.com.au  Fact Sheet for Healthcare Providers: IncredibleEmployment.be  This test is not yet approved or cleared by the [place] FDA and has been authorized for detection and/or diagnosis of SARS-CoV-2 by FDA under an Emergency Use Authorization (EUA). This EUA will remain in effect (meaning this test can be used) for the duration of the COVID-19 declaration under Section 564(b)(1) of the Act, 21 U.S.C. section 360bbb-3(b)(1), unless the authorization is terminated or revoked.  Performed at [facility], [place] [address], [place], [place] [address]    Sodium 03/04/2021 138  135 - 145 mmol/L Final   Potassium 03/04/2021 3.8  3.5 - 5.1 mmol/L Final   Chloride 03/04/2021 107  98 - 111 mmol/L Final   CO2 03/04/2021 24  22 - 32 mmol/L Final   Glucose, Bld 03/04/2021 95  70 - 99 mg/dL Final   Glucose reference range applies only to samples taken after fasting for at least 8 hours.   BUN 03/04/2021 14  6 - 20 mg/dL Final   Creatinine, Ser 03/04/2021 1.11  0.61 - 1.24 mg/dL Final   Calcium 03/04/2021 9.1  8.9 - 10.3 mg/dL Final   Total Protein 03/04/2021 6.6  6.5 - 8.1 g/dL Final   Albumin 03/04/2021 4.1  3.5 - 5.0 g/dL Final   AST 03/04/2021 14 (A)  15 - 41 U/L Final   ALT 03/04/2021 27  0 - 44 U/L Final   Alkaline Phosphatase 03/04/2021 78  38 - 126 U/L Final   Total Bilirubin 03/04/2021 0.6  0.3 - 1.2 mg/dL  Final   GFR, Estimated 03/04/2021 >60  >60 mL/min Final   Comment: (NOTE) Calculated using the CKD-EPI Creatinine Equation (2021)    Anion gap 03/04/2021 7  5 - 15 Final   Performed at [place] [address] [place], [place] [address]   Alcohol, Ethyl (B) 03/04/2021 <10  <10 mg/dL Final   Comment: (NOTE) Lowest detectable limit for serum alcohol is 10 mg/dL.  For medical purposes only. Performed at [facility], [place] [address], [place], [place] [address]    Opiates 03/04/2021 NONE DETECTED  NONE DETECTED Final   Cocaine 03/04/2021 NONE DETECTED  NONE DETECTED Final   Benzodiazepines 03/04/2021 NONE DETECTED  NONE DETECTED Final  Amphetamines 03/04/2021 NONE DETECTED  NONE DETECTED Final   Tetrahydrocannabinol 03/04/2021 NONE DETECTED  NONE DETECTED Final   Barbiturates 03/04/2021 NONE DETECTED  NONE DETECTED Final   Comment: (NOTE) DRUG SCREEN FOR MEDICAL PURPOSES ONLY.  IF CONFIRMATION IS NEEDED FOR ANY PURPOSE, NOTIFY LAB WITHIN 5 DAYS.  LOWEST DETECTABLE LIMITS FOR URINE DRUG SCREEN Drug Class                     Cutoff (ng/mL) Amphetamine and metabolites    1000 Barbiturate and metabolites    200 Benzodiazepine                 [id number] Tricyclics and metabolites     300 Opiates and metabolites        300 Cocaine and metabolites        300 THC                            50 Performed at [facility], [place] [address] [place], [place] [address]    WBC 03/04/2021 9.3  4.0 - 10.5 K/uL Final   RBC 03/04/2021 5.17  4.22 - 5.81 MIL/uL Final   Hemoglobin 03/04/2021 13.3  13.0 - 17.0 g/dL Final   HCT 03/04/2021 43.4  39.0 - 52.0 % Final   MCV 03/04/2021 83.9  80.0 - 100.0 fL Final   MCH 03/04/2021 25.7 (A)  26.0 - 34.0 pg Final   MCHC 03/04/2021 30.6  30.0 - 36.0 g/dL Final   RDW 03/04/2021 15.1  11.5 - 15.5 % Final   Platelets 03/04/2021 210  150 - 400 K/uL Final   nRBC 03/04/2021 0.0  0.0 - 0.2 % Final   Neutrophils Relative % 03/04/2021 60  % Final    Neutro Abs 03/04/2021 5.6  1.7 - 7.7 K/uL Final   Lymphocytes Relative 03/04/2021 27  % Final   Lymphs Abs 03/04/2021 2.5  0.7 - 4.0 K/uL Final   Monocytes Relative 03/04/2021 8  % Final   Monocytes Absolute 03/04/2021 0.7  0.1 - 1.0 K/uL Final   Eosinophils Relative 03/04/2021 3  % Final   Eosinophils Absolute 03/04/2021 0.3  0.0 - 0.5 K/uL Final   Basophils Relative 03/04/2021 1  % Final   Basophils Absolute 03/04/2021 0.1  0.0 - 0.1 K/uL Final   Immature Granulocytes 03/04/2021 1  % Final   Abs Immature Granulocytes 03/04/2021 0.10 (A)  0.00 - 0.07 K/uL Final   Performed at [facility], [place] [address] [place], [place] [address]  Admission on 01/14/2021, Discharged on 01/17/2021  Component Date Value Ref Range Status   WBC 01/14/2021 7.2  4.0 - 10.5 K/uL Final   RBC 01/14/2021 5.19  4.22 - 5.81 MIL/uL Final   Hemoglobin 01/14/2021 13.6  13.0 - 17.0 g/dL Final   HCT 01/14/2021 42.9  39.0 - 52.0 % Final   MCV 01/14/2021 82.7  80.0 - 100.0 fL Final   MCH 01/14/2021 26.2  26.0 - 34.0 pg Final   MCHC 01/14/2021 31.7  30.0 - 36.0 g/dL Final   RDW 01/14/2021 15.2  11.5 - 15.5 % Final   Platelets 01/14/2021 197  150 - 400 K/uL Final   nRBC 01/14/2021 0.0  0.0 - 0.2 % Final   Neutrophils Relative % 01/14/2021 56  % Final   Neutro Abs 01/14/2021 4.1  1.7 - 7.7 K/uL Final   Lymphocytes Relative 01/14/2021 28  % Final  Lymphs Abs 01/14/2021 2.0  0.7 - 4.0 K/uL Final   Monocytes Relative 01/14/2021 10  % Final   Monocytes Absolute 01/14/2021 0.7  0.1 - 1.0 K/uL Final   Eosinophils Relative 01/14/2021 4  % Final   Eosinophils Absolute 01/14/2021 0.3  0.0 - 0.5 K/uL Final   Basophils Relative 01/14/2021 1  % Final   Basophils Absolute 01/14/2021 0.0  0.0 - 0.1 K/uL Final   Immature Granulocytes 01/14/2021 1  % Final   Abs Immature Granulocytes 01/14/2021 0.04  0.00 - 0.07 K/uL Final   Performed at [facility] [address], [place], [place] [address]   Sodium 01/14/2021  138  135 - 145 mmol/L Final   Potassium 01/14/2021 4.0  3.5 - 5.1 mmol/L Final   Chloride 01/14/2021 110  98 - 111 mmol/L Final   CO2 01/14/2021 20 (A)  22 - 32 mmol/L Final   Glucose, Bld 01/14/2021 103 (A)  70 - 99 mg/dL Final   Glucose reference range applies only to samples taken after fasting for at least 8 hours.   BUN 01/14/2021 14  6 - 20 mg/dL Final   Creatinine, Ser 01/14/2021 1.07  0.61 - 1.24 mg/dL Final   Calcium 01/14/2021 9.2  8.9 - 10.3 mg/dL Final   Total Protein 01/14/2021 6.0 (A)  6.5 - 8.1 g/dL Final   Albumin 01/14/2021 4.0  3.5 - 5.0 g/dL Final   AST 01/14/2021 17  15 - 41 U/L Final   ALT 01/14/2021 36  0 - 44 U/L Final   Alkaline Phosphatase 01/14/2021 61  38 - 126 U/L Final   Total Bilirubin 01/14/2021 0.6  0.3 - 1.2 mg/dL Final   GFR, Estimated 01/14/2021 >60  >60 mL/min Final   Comment: (NOTE) Calculated using the CKD-EPI Creatinine Equation (2021)    Anion gap 01/14/2021 8  5 - 15 Final   Performed at [facility] [address], [place], [place] [address]   SARS Coronavirus 2 by RT PCR 01/14/2021 NEGATIVE  NEGATIVE Final   Comment: (NOTE) SARS-CoV-2 target nucleic acids are NOT DETECTED.  The SARS-CoV-2 RNA is generally detectable in upper respiratory specimens during the acute phase of infection. The lowest concentration of SARS-CoV-2 viral copies this assay can detect is 138 copies/mL. A negative result does not preclude SARS-Cov-2 infection and should not be used as the sole basis for treatment or other patient management decisions. A negative result may occur with  improper specimen collection/handling, submission of specimen other than nasopharyngeal swab, presence of viral mutation(s) within the areas targeted by this assay, and inadequate number of viral copies(<138 copies/mL). A negative result must be combined with clinical observations, patient history, and epidemiological information. The expected result is Negative.  Fact Sheet for  Patients:  EntrepreneurPulse.com.au  Fact Sheet for Healthcare Providers:  IncredibleEmployment.be  This test is no                          t yet approved or cleared by the [place] FDA and  has been authorized for detection and/or diagnosis of SARS-CoV-2 by FDA under an Emergency Use Authorization (EUA). This EUA will remain  in effect (meaning this test can be used) for the duration of the COVID-19 declaration under Section 564(b)(1) of the Act, 21 U.S.C.section 360bbb-3(b)(1), unless the authorization is terminated  or revoked sooner.       Influenza A by PCR 01/14/2021 NEGATIVE  NEGATIVE Final  Influenza B by PCR 01/14/2021 NEGATIVE  NEGATIVE Final   Comment: (NOTE) The Xpert Xpress SARS-CoV-2/FLU/RSV plus assay is intended as an aid in the diagnosis of influenza from Nasopharyngeal swab specimens and should not be used as a sole basis for treatment. Nasal washings and aspirates are unacceptable for Xpert Xpress SARS-CoV-2/FLU/RSV testing.  Fact Sheet for Patients: EntrepreneurPulse.com.au  Fact Sheet for Healthcare Providers: IncredibleEmployment.be  This test is not yet approved or cleared by the [place] FDA and has been authorized for detection and/or diagnosis of SARS-CoV-2 by FDA under an Emergency Use Authorization (EUA). This EUA will remain in effect (meaning this test can be used) for the duration of the COVID-19 declaration under Section 564(b)(1) of the Act, 21 U.S.C. section 360bbb-3(b)(1), unless the authorization is terminated or revoked.  Performed at [facility], [place] [address] [place], [place] [address]    TSH 01/15/2021 1.424  0.350 - 4.500 uIU/mL Final   Comment: Performed by a 3rd Generation assay with a functional sensitivity of <=0.01 uIU/mL. Performed at [facility], [place] [address], [place], [place] [address]    Vitamin B-12 01/15/2021 738  180 -  914 pg/mL Final   Comment: (NOTE) This assay is not validated for testing neonatal or myeloproliferative syndrome specimens for Vitamin B12 levels. Performed at [facility], [place] [address] [place], [place] [address]    Sodium 01/16/2021 138  135 - 145 mmol/L Final   Potassium 01/16/2021 3.6  3.5 - 5.1 mmol/L Final   Chloride 01/16/2021 108  98 - 111 mmol/L Final   CO2 01/16/2021 21 (A)  22 - 32 mmol/L Final   Glucose, Bld 01/16/2021 126 (A)  70 - 99 mg/dL Final   Glucose reference range applies only to samples taken after fasting for at least 8 hours.   BUN 01/16/2021 6  6 - 20 mg/dL Final   Creatinine, Ser 01/16/2021 0.82  0.61 - 1.24 mg/dL Final   Calcium 01/16/2021 9.5  8.9 - 10.3 mg/dL Final   Total Protein 01/16/2021 6.7  6.5 - 8.1 g/dL Final   Albumin 01/16/2021 4.2  3.5 - 5.0 g/dL Final   AST 01/16/2021 16  15 - 41 U/L Final   ALT 01/16/2021 44  0 - 44 U/L Final   Alkaline Phosphatase 01/16/2021 76  38 - 126 U/L Final   Total Bilirubin 01/16/2021 0.7  0.3 - 1.2 mg/dL Final   GFR, Estimated 01/16/2021 >60  >60 mL/min Final   Comment: (NOTE) Calculated using the CKD-EPI Creatinine Equation (2021)    Anion gap 01/16/2021 9  5 - 15 Final   Performed at [place] [address], [place], [place] [address]   Glucose-Capillary 01/16/2021 138 (A)  70 - 99 mg/dL Final   Glucose reference range applies only to samples taken after fasting for at least 8 hours.   Glucose-Capillary 01/16/2021 120 (A)  70 - 99 mg/dL Final   Glucose reference range applies only to samples taken after fasting for at least 8 hours.    Allergies: Patient has no known allergies.  PTA Medications: (Not in a hospital admission)   Medical Decision Making  Overnight observation    Recommendations  Based on my evaluation the patient appears to have an emergency medical condition for which I recommend the patient be transferred to the emergency department for further evaluation.  Derrill Center, NP 05/12/21  5:22 PM

## 2021-05-12 NOTE — ED Notes (Signed)
Pt is lying in bed quietly with eyes closed, no distress noted, will continue to monitor patient for safety.

## 2021-05-12 NOTE — ED Notes (Signed)
Pt is in bed sleeping, no distress noted, will continue to monitor patient for safety

## 2021-05-12 NOTE — Progress Notes (Signed)
   05/12/21 1638  [facility] Triage Screening (Walk-ins at [facility] only)  How Did You Hear About [place]? Family/Friend  What Is the Reason for Your Visit/Call Today? Pt is a 30 yo male who was brought in by [facility] after a call from his mother to 911. Per mother, pt was agitated, aggressive and hitting the wall in their home. Per mom, pt has had a recent medication change by his psychiatrist, Dr. Jannifer Franklin, about 2 weeks ago. Mother also stated he had been responding to internal stimuli. Hx of Schizophrenia, IDD and IP psych admissions. Pt was very slow to respond to questions asked. Pt stated he was not thinking of hurting himself or others and stated today, for the first time, he began "seeing things." He did not elaborate with any further details.  How Long Has This Been Causing You Problems? > than 6 months  Have You Recently Had Any Thoughts About Hurting Yourself? No  Are You Planning to Commit Suicide/Harm Yourself At This time? No  Have you Recently Had Thoughts About Hurting Someone [name]? No  Are You Planning To Harm Someone At This Time? No  Are you currently experiencing any auditory, visual or other hallucinations? Yes  Please explain the hallucinations you are currently experiencing: VH  Have You Used Any Alcohol or Drugs in the Past 24 Hours? No  Do you have any current medical co-morbidities that require immediate attention? No  Clinician description of patient physical appearance/behavior: Pt was casually dressed and seemed adequately groomed. He spoke and moved slowly and seemed a bit incoherent. Pt seemed oriented to self and situation but it was unclear whether pt was oriented to place and time.  If access to [facility] was not available, would you have sought care in the Emergency Department? Yes  Determination of Need Urgent (48 hours) (Per Hillery Jacks NP, pt is recommended for OBS unit at [facility])  Options For Referral [facility];Other: Comment (OBS unit)  Ashley Montminy T. Jimmye Norman, MS, [facility],  [facility] Triage Specialist [facility]

## 2021-05-13 DIAGNOSIS — F201 Disorganized schizophrenia: Secondary | ICD-10-CM | POA: Diagnosis not present

## 2021-05-13 DIAGNOSIS — R443 Hallucinations, unspecified: Secondary | ICD-10-CM

## 2021-05-13 DIAGNOSIS — R451 Restlessness and agitation: Secondary | ICD-10-CM | POA: Diagnosis not present

## 2021-05-13 MED ORDER — LORAZEPAM 1 MG PO TABS
ORAL_TABLET | ORAL | Status: AC
  Filled 2021-05-13: qty 1

## 2021-05-13 MED ORDER — LORAZEPAM 1 MG PO TABS
1.0000 mg | ORAL_TABLET | ORAL | Status: AC
  Administered 2021-05-13: 1 mg via ORAL

## 2021-05-13 NOTE — Discharge Instructions (Addendum)
Take all medications as prescribed. Keep all follow-up appointments as scheduled.  Do not consume alcohol or use illegal drugs while on prescription medications. Report any adverse effects from your medications to your primary care provider promptly.  In the event of recurrent symptoms or worsening symptoms, call 911, a crisis hotline, or go to the nearest emergency department for evaluation.   

## 2021-05-13 NOTE — ED Notes (Signed)
Pt hitting himself with the phone in the head. Staff attempted to redirect, pt began yelling at this nurse and cursing.

## 2021-05-13 NOTE — ED Notes (Signed)
Patient continues to have sudden loud outbursts keeping patients on the unit awake

## 2021-05-13 NOTE — ED Notes (Signed)
Pt lying in bed with eyes open, talking loudly to himself, no distress noted, will continue to monitor patient for safety.

## 2021-05-13 NOTE — ED Provider Notes (Signed)
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  Stay Summary:  Brandon [name] was seen and evaluated face-to-face.  Patient presents pleasant, cooperative.  Reports chronic hallucinations.  Denied voices are command in nature.  Patient is requesting to be discharged.  States " someone's is going to have to take me home."  NP spoke to patient's mother  [name] who is patient's primary care provider who reports. "  I am unable to care for here and I am a paraplegic."  Reports staying up most of the night monitoring patient's behavior."  Mother reports patient paces at home responding to internal stimuli.  Reports he is followed by psychiatry who recently made a medication adjustment.  Mother is seeking long-term treatment facility.  NP connected social worker Jeneen Rinks for additional resources.  Patient to follow-up with [facility] and[facility]or [facility].  Mother reports she has reached out to [facility] for additional resources without success.  States she is attempted to follow-up for group home setting.  Patient is to be discharged.  Support, encouragement and reassurance was provided.   Per admission assessment note: "[name] 30 year old African-American male that presents voluntary with [facility] [employer].  Patient has a charted history with schizophrenia, agitation and IDD. Brandon [name] is  reporting responding to visual hallucinations. Stated that is symptom is new. He is currently denying suicidal or homicidal  ideations."  Total Time spent with patient: 15 minutes  Past Psychiatric History:  Past Medical History:  Past Medical History:  Diagnosis Date   ADHD (attention deficit hyperactivity disorder)    Bipolar 1 disorder (HCC)    Chronic back pain    Chronic  constipation    Chronic neck pain    Hypertension    Multiple sclerosis ([place]) 05/20/2013   left sided weakness, dysarthria   Non-compliance    Obesity    Pulmonary embolism ([place])    Schizophrenia ([place])    Stroke ([place])    left sided deficits - pt's mother denies this    Past Surgical History:  Procedure Laterality Date   NO PAST SURGERIES     None     RADIOLOGY WITH ANESTHESIA N/A 01/16/2021   Procedure: MRI WITH ANESTHESIA CERVICAL AND THORASIC SPINE WITH AND WITHOUT CONTRAST;  Surgeon: Radiologist, Medication, MD;  Location: [place];  Service: Radiology;  Laterality: N/A;   TOOTH EXTRACTION N/A 06/24/2019   Procedure: DENTAL RESTORATION/EXTRACTION OF TEETH NUMBER ONE, SIXTEEN, SEVENTEEN, NINETEEN, THIRTY-TWO;  Surgeon: Diona Browner, DDS;  Location: [place];  Service: Oral Surgery;  Laterality: N/A;   Family History:  Family History  Problem Relation Age of Onset   Diabetes Mother    ADD / ADHD Brother    Family Psychiatric History:  Social History:  Social History   Substance and Sexual Activity  Alcohol Use Not Currently   Alcohol/week: 0.0 standard drinks   Comment: "A little bit"      Social History   Substance and Sexual Activity  Drug Use Not Currently   Types: Marijuana   Comment: Last used: unknown     Social History   Socioeconomic History   Marital status: Single    Spouse name: Not on file   Number of children: 0   Years of education: 11th   Highest education level: Not on file  Occupational History   Occupation: unemployed    Employer: Teacher, English as a foreign language    Comment: Disbaled  Tobacco Use   Smoking status: Every Day    Packs/day: 0.25    Types: Cigarettes   Smokeless tobacco: Never   Tobacco comments:    2 cigarettes a day  Vaping Use   Vaping Use: Never used  Substance and Sexual Activity   Alcohol use: Not Currently    Alcohol/week: 0.0 standard drinks    Comment: "A little bit"    Drug use: Not Currently    Types: Marijuana    Comment: Last  used: unknown    Sexual activity: Not on file  Other Topics Concern   Not on file  Social History Narrative   Patient lives at home with his mother.   Disabled.   Education 11 th grade .   Right handed.   Drinks caffeine occassionally   Social Determinants of Corporate investment banker Strain: Not on file  Food Insecurity: Not on file  Transportation Needs: Not on file  Physical Activity: Not on file  Stress: Not on file  Social Connections: Not on file   SDOH:  SDOH Screenings   Alcohol Screen: Low Risk    Last Alcohol Screening Score (AUDIT): 0  Depression (PHQ2-9): Not on file  Financial Resource Strain: Not on file  Food Insecurity: Not on file  Housing: Not on file  Physical Activity: Not on file  Social Connections: Not on file  Stress: Not on file  Tobacco Use: High Risk   Smoking Tobacco Use: Every Day   Smokeless Tobacco Use: Never   Passive Exposure: Not on file  Transportation Needs: Not on file    Tobacco Cessation:  N/A, patient does not currently use tobacco products  Current Medications:  Current Facility-Administered Medications  Medication Dose Route Frequency Provider Last Rate Last Admin   acetaminophen (TYLENOL) tablet 650 mg  650 mg Oral Q6H PRN Oneta Rack, NP       alum & mag hydroxide-simeth (MAALOX/MYLANTA) 200-200-20 MG/5ML suspension 30 mL  30 mL Oral Q4H PRN Oneta Rack, NP       amantadine (SYMMETREL) capsule 100 mg  100 mg Oral BID Oneta Rack, NP   100 mg at 05/13/21 0916   amLODipine (NORVASC) tablet 5 mg  5 mg Oral Once Oneta Rack, NP       apixaban (ELIQUIS) tablet 5 mg  5 mg Oral Once Oneta Rack, NP       ARIPiprazole (ABILIFY) tablet 15 mg  15 mg Oral Once Oneta Rack, NP       hydrOXYzine (ATARAX/VISTARIL) tablet 25 mg  25 mg Oral TID PRN Oneta Rack, NP   25 mg at 05/13/21 0945   latanoprost (XALATAN) 0.005 % ophthalmic solution 1 drop  1 drop Both Eyes QHS Zaid Tomes N, NP       magnesium  hydroxide (MILK OF MAGNESIA) suspension 30 mL  30 mL Oral Daily PRN Oneta Rack, NP       metFORMIN (GLUCOPHAGE) tablet 500 mg  500 mg Oral BID WC Oneta Rack, NP   500 mg at 05/13/21 0827   metoprolol tartrate (LOPRESSOR) tablet 25 mg  25 mg Oral Once Oneta Rack, NP       topiramate (TOPAMAX) tablet 25 mg  25 mg Oral Once Oneta Rack, NP       traZODone (DESYREL) tablet 100 mg  100  mg Oral QHS PRN Derrill Center, NP   100 mg at 05/12/21 2136   Current Outpatient Medications  Medication Sig Dispense Refill   acetaminophen (TYLENOL) 500 MG tablet Take 1 tablet (500 mg total) by mouth every 6 (six) hours as needed. 30 tablet 0   amLODipine (NORVASC) 5 MG tablet Take 5 mg by mouth daily.     apixaban (ELIQUIS) 5 MG TABS tablet Take 5 mg by mouth 2 (two) times daily.      ARIPiprazole (ABILIFY) 15 MG tablet Take 1 tablet (15 mg total) by mouth daily. 30 tablet 0   docusate sodium (COLACE) 100 MG capsule Take 400 mg by mouth at bedtime.      latanoprost (XALATAN) 0.005 % ophthalmic solution INSTILL 1 DROP INTO BOTH EYES EVERY NIGHT (Patient taking differently: Place 1 drop into both eyes at bedtime.) 7.5 mL 10   loratadine (CLARITIN) 10 MG tablet Take 10 mg by mouth daily as needed for allergies.     LUMIGAN 0.01 % SOLN Place 1 drop into both eyes at bedtime.     metFORMIN (GLUCOPHAGE-XR) 500 MG 24 hr tablet Take 500 mg by mouth 2 (two) times daily.     metoprolol succinate (TOPROL-XL) 25 MG 24 hr tablet Take 1 tablet (25 mg total) by mouth daily. 30 tablet 0   Multiple Vitamin (MULTIVITAMIN WITH MINERALS) TABS tablet Take 1 tablet by mouth daily.     nicotine (NICODERM CQ - DOSED IN MG/24 HOURS) 14 mg/24hr patch Place 1 patch (14 mg total) onto the skin daily. 28 patch 0   topiramate (TOPAMAX) 25 MG tablet TAKE ONE TABLET BY MOUTH TWICE A DAY (Patient taking differently: Take 25 mg by mouth 2 (two) times daily.) 60 tablet 4   traZODone (DESYREL) 100 MG tablet Take 200 mg by mouth  at bedtime.      PTA Medications: (Not in a hospital admission)   Musculoskeletal  Strength & Muscle Tone: within normal limits Gait & Station: normal Patient leans: N/A  Psychiatric Specialty Exam  Presentation  General Appearance: Appropriate for Environment  Eye Contact:Good  Speech:Clear and Coherent; Slow  Speech Volume:Normal  Handedness:Right   Mood and Affect  Mood:Dysphoric  Affect:Congruent   Thought Process  Thought Processes:Coherent  Descriptions of Associations:Circumstantial  Orientation:Full (Time, Place and Person)  Thought Content:WDL  Diagnosis of Schizophrenia or Schizoaffective disorder in past: Yes  Duration of Psychotic Symptoms: Greater than six months   Hallucinations:No data recorded Ideas of Reference:None  Suicidal Thoughts:No data recorded Homicidal Thoughts:No data recorded  Sensorium  Memory:Immediate Fair; Recent Fair  Judgment:Fair  Insight:Fair; Present   Executive Functions  Concentration:Fair  Attention Span:Fair  [place]   Psychomotor Activity  Psychomotor Activity:No data recorded  Assets  Assets:Communication Skills; Housing; Leisure Time; Resilience; Social Support   Sleep  Sleep:No data recorded  No data recorded  Physical Exam  Physical Exam Vitals and nursing note reviewed.  Cardiovascular:     Rate and Rhythm: Normal rate.  Neurological:     Mental Status: He is oriented to person, place, and time.  Psychiatric:        Attention and Perception: Attention and perception normal.        Mood and Affect: Mood normal.        Speech: Speech normal.        Behavior: Behavior normal.        Thought Content: Thought content normal.  Cognition and Memory: Cognition normal.        Judgment: Judgment normal.   Review of Systems  Eyes: Negative.   Gastrointestinal: Negative.   Psychiatric/Behavioral:  Negative for depression, hallucinations  and suicidal ideas. The patient is nervous/anxious.   All other systems reviewed and are negative. Blood pressure (!) 124/104, pulse 91, temperature 98.2 F (36.8 C), temperature source Oral, resp. rate 18, SpO2 100 %. There is no height or weight on file to calculate BMI.  Demographic Factors:  Male  Loss Factors: Decrease in vocational status  Historical Factors: Impulsivity  Risk Reduction Factors:   Living with another person, especially a relative, Positive social support, and Positive therapeutic relationship  Continued Clinical Symptoms:  Schizophrenia:   Command hallucinatons Paranoid or undifferentiated type  Cognitive Features That Contribute To Risk:  Closed-mindedness    Suicide Risk:  Moderate:  Frequent suicidal ideation with limited intensity, and duration, some specificity in terms of plans, no associated intent, good self-control, limited dysphoria/symptomatology, some risk factors present, and identifiable protective factors, including available and accessible social support.  Plan Of Care/Follow-up recommendations:  Activity:  as tolerated  Diet:  heart healthy   -CSW spoke to mother at length regarding additional resources for [facility] in [facility] to include [facility] for additional [facility] resources.  Disposition: Take all medications as prescribed. Keep all follow-up appointments as scheduled.  Do not consume alcohol or use illegal drugs while on prescription medications. Report any adverse effects from your medications to your primary care provider promptly.  In the event of recurrent symptoms or worsening symptoms, call 911, a crisis hotline, or go to the nearest emergency department for evaluation.    Derrill Center, NP 05/13/2021, 10:24 AM

## 2021-05-13 NOTE — ED Notes (Signed)
Pt sitting at the end of the pull out bed laughing and smiling to himself.

## 2021-05-13 NOTE — Progress Notes (Signed)
Patient resting on bed, awake.  Quiet.  No distress noted.  Continue to monitor for safety.

## 2021-05-13 NOTE — ED Notes (Signed)
Safe transport called for transportation services to go home.

## 2021-05-13 NOTE — ED Notes (Signed)
Pt on the phone making loud noises and then hung up. Stopped making the loud noises when he saw this nurse looking at him. Pt began to run from one side of the observation  unit to the other multiple times. Then began to walk briskly from one side to the other then back into a jog fashion. Pt accepted morning meds w/o difficulty. Safety maintained and will continue to monitor.

## 2021-05-13 NOTE — ED Notes (Signed)
Patient up responding very loudly to internal stimuli, keeping patents up throughout the night.

## 2021-05-13 NOTE — ED Notes (Signed)
Discharge instructions provided and Pt stated understanding. Pt alert, orient and ambulatory prior to d/c from facility. Personal belongings returned from locker number 9. Safe transport called for transportation services. Pt escorted to the sally port. Safety maintained.   

## 2021-05-17 ENCOUNTER — Telehealth (HOSPITAL_COMMUNITY): Payer: Self-pay | Admitting: Nurse Practitioner

## 2021-05-17 NOTE — BH Assessment (Signed)
Care Management - Follow Up [facility] Discharges   Writer made contact with the patient and his mother. Patient reports that he has a follow up appointment with ith his establishe provider [facility]

## 2021-05-28 ENCOUNTER — Encounter (HOSPITAL_COMMUNITY): Payer: Self-pay

## 2021-05-28 ENCOUNTER — Other Ambulatory Visit: Payer: Self-pay

## 2021-05-28 ENCOUNTER — Emergency Department (HOSPITAL_COMMUNITY)
Admission: EM | Admit: 2021-05-28 | Discharge: 2021-05-29 | Disposition: A | Discharge status: Other Acute Inpatient Hospital | Payer: Medicare Other | Attending: Emergency Medicine | Admitting: Emergency Medicine

## 2021-05-28 DIAGNOSIS — F17210 Nicotine dependence, cigarettes, uncomplicated: Secondary | ICD-10-CM | POA: Insufficient documentation

## 2021-05-28 DIAGNOSIS — Z7901 Long term (current) use of anticoagulants: Secondary | ICD-10-CM | POA: Insufficient documentation

## 2021-05-28 DIAGNOSIS — F201 Disorganized schizophrenia: Secondary | ICD-10-CM | POA: Diagnosis not present

## 2021-05-28 DIAGNOSIS — Y900 Blood alcohol level of less than 20 mg/100 ml: Secondary | ICD-10-CM | POA: Diagnosis not present

## 2021-05-28 DIAGNOSIS — Z20822 Contact with and (suspected) exposure to covid-19: Secondary | ICD-10-CM | POA: Insufficient documentation

## 2021-05-28 DIAGNOSIS — Z7984 Long term (current) use of oral hypoglycemic drugs: Secondary | ICD-10-CM | POA: Insufficient documentation

## 2021-05-28 DIAGNOSIS — I10 Essential (primary) hypertension: Secondary | ICD-10-CM | POA: Diagnosis not present

## 2021-05-28 DIAGNOSIS — Z79899 Other long term (current) drug therapy: Secondary | ICD-10-CM | POA: Diagnosis not present

## 2021-05-28 DIAGNOSIS — F209 Schizophrenia, unspecified: Secondary | ICD-10-CM

## 2021-05-28 LAB — ETHANOL: Alcohol, Ethyl (B): <10 mg/dL (ref <10)

## 2021-05-28 LAB — CBC WITH DIFFERENTIAL/PLATELET
Abs Immature Granulocytes: 0.05 10*3/uL (ref 0.00–0.07)
Basophils Absolute: 0.1 10*3/uL (ref 0.0–0.1)
Basophils Relative: 1 %
Eosinophils Absolute: 0.1 10*3/uL (ref 0.0–0.5)
Eosinophils Relative: 2 %
HCT: 46.7 % (ref 39.0–52.0)
Hemoglobin: 14.5 g/dL (ref 13.0–17.0)
Immature Granulocytes: 1 %
Lymphocytes Relative: 20 %
Lymphs Abs: 1.5 10*3/uL (ref 0.7–4.0)
MCH: 25.4 pg — ABNORMAL LOW (ref 26.0–34.0)
MCHC: 31 g/dL (ref 30.0–36.0)
MCV: 81.8 fL (ref 80.0–100.0)
Monocytes Absolute: 0.5 10*3/uL (ref 0.1–1.0)
Monocytes Relative: 7 %
Neutro Abs: 5.4 10*3/uL (ref 1.7–7.7)
Neutrophils Relative %: 69 %
Platelets: 193 10*3/uL (ref 150–400)
RBC: 5.71 MIL/uL (ref 4.22–5.81)
RDW: 14.9 % (ref 11.5–15.5)
WBC: 7.7 10*3/uL (ref 4.0–10.5)
nRBC: 0 % (ref 0.0–0.2)

## 2021-05-28 LAB — COMPREHENSIVE METABOLIC PANEL
ALT: 30 U/L (ref 0–44)
AST: 13 U/L — ABNORMAL LOW (ref 15–41)
Albumin: 4.4 g/dL (ref 3.5–5.0)
Alkaline Phosphatase: 69 U/L (ref 38–126)
Anion gap: 7 (ref 5–15)
BUN: 13 mg/dL (ref 6–20)
CO2: 22 mmol/L (ref 22–32)
Calcium: 9.2 mg/dL (ref 8.9–10.3)
Chloride: 108 mmol/L (ref 98–111)
Creatinine, Ser: 1.06 mg/dL (ref 0.61–1.24)
GFR, Estimated: >60 mL/min (ref >60)
Glucose, Bld: 98 mg/dL (ref 70–99)
Potassium: 3.7 mmol/L (ref 3.5–5.1)
Sodium: 137 mmol/L (ref 135–145)
Total Bilirubin: 0.5 mg/dL (ref 0.3–1.2)
Total Protein: 7.1 g/dL (ref 6.5–8.1)

## 2021-05-28 LAB — CBG MONITORING, ED: Glucose-Capillary: 97 mg/dL (ref 70–99)

## 2021-05-28 LAB — RESP PANEL BY RT-PCR (FLU A&B, COVID) ARPGX2
Influenza A by PCR: NEGATIVE
Influenza B by PCR: NEGATIVE
SARS Coronavirus 2 by RT PCR: NEGATIVE

## 2021-05-28 MED ORDER — METFORMIN HCL ER 500 MG PO TB24
500.0000 mg | ORAL_TABLET | Freq: Two times a day (BID) | ORAL | Status: DC
  Administered 2021-05-28 – 2021-05-29 (×2): 500 mg via ORAL
  Filled 2021-05-28 (×3): qty 1

## 2021-05-28 MED ORDER — ADULT MULTIVITAMIN W/MINERALS CH
1.0000 | ORAL_TABLET | Freq: Every day | ORAL | Status: DC
  Administered 2021-05-28 – 2021-05-29 (×2): 1 via ORAL
  Filled 2021-05-28 (×2): qty 1

## 2021-05-28 MED ORDER — LORAZEPAM 1 MG PO TABS
1.0000 mg | ORAL_TABLET | Freq: Two times a day (BID) | ORAL | Status: DC
  Administered 2021-05-28 – 2021-05-29 (×3): 1 mg via ORAL
  Filled 2021-05-28 (×3): qty 1

## 2021-05-28 MED ORDER — RISPERIDONE 0.5 MG PO TBDP
2.0000 mg | ORAL_TABLET | Freq: Three times a day (TID) | ORAL | Status: DC | PRN
  Administered 2021-05-28: 2 mg via ORAL
  Filled 2021-05-28: qty 4

## 2021-05-28 MED ORDER — STERILE WATER FOR INJECTION IJ SOLN
INTRAMUSCULAR | Status: AC
  Administered 2021-05-28: 1.2 mL
  Filled 2021-05-28: qty 10

## 2021-05-28 MED ORDER — APIXABAN 5 MG PO TABS
5.0000 mg | ORAL_TABLET | Freq: Two times a day (BID) | ORAL | Status: DC
  Administered 2021-05-28 – 2021-05-29 (×3): 5 mg via ORAL
  Filled 2021-05-28 (×4): qty 1

## 2021-05-28 MED ORDER — TRAZODONE HCL 100 MG PO TABS
200.0000 mg | ORAL_TABLET | Freq: Every day | ORAL | Status: DC
  Administered 2021-05-28: 200 mg via ORAL
  Filled 2021-05-28: qty 2

## 2021-05-28 MED ORDER — AMANTADINE HCL 100 MG PO CAPS
100.0000 mg | ORAL_CAPSULE | Freq: Two times a day (BID) | ORAL | Status: DC
  Administered 2021-05-28 – 2021-05-29 (×3): 100 mg via ORAL
  Filled 2021-05-28 (×3): qty 1

## 2021-05-28 MED ORDER — LORAZEPAM 1 MG PO TABS
1.0000 mg | ORAL_TABLET | Freq: Once | ORAL | Status: AC
  Administered 2021-05-28: 1 mg via ORAL
  Filled 2021-05-28: qty 1

## 2021-05-28 MED ORDER — AMLODIPINE BESYLATE 5 MG PO TABS
5.0000 mg | ORAL_TABLET | Freq: Every day | ORAL | Status: DC
  Administered 2021-05-28 – 2021-05-29 (×2): 5 mg via ORAL
  Filled 2021-05-28 (×2): qty 1

## 2021-05-28 MED ORDER — METOPROLOL SUCCINATE ER 50 MG PO TB24
25.0000 mg | ORAL_TABLET | Freq: Every day | ORAL | Status: DC
  Administered 2021-05-28 – 2021-05-29 (×2): 25 mg via ORAL
  Filled 2021-05-28 (×2): qty 1

## 2021-05-28 MED ORDER — ARIPIPRAZOLE 5 MG PO TABS
15.0000 mg | ORAL_TABLET | Freq: Every day | ORAL | Status: DC
  Administered 2021-05-28 – 2021-05-29 (×2): 15 mg via ORAL
  Filled 2021-05-28 (×2): qty 1

## 2021-05-28 MED ORDER — LORAZEPAM 1 MG PO TABS
1.0000 mg | ORAL_TABLET | ORAL | Status: AC | PRN
  Administered 2021-05-28: 1 mg via ORAL
  Filled 2021-05-28: qty 1

## 2021-05-28 MED ORDER — ZIPRASIDONE MESYLATE 20 MG IM SOLR
20.0000 mg | INTRAMUSCULAR | Status: AC | PRN
  Administered 2021-05-28: 20 mg via INTRAMUSCULAR
  Filled 2021-05-28: qty 20

## 2021-05-28 MED ORDER — TOPIRAMATE 25 MG PO TABS
25.0000 mg | ORAL_TABLET | Freq: Two times a day (BID) | ORAL | Status: DC
  Administered 2021-05-28 – 2021-05-29 (×3): 25 mg via ORAL
  Filled 2021-05-28 (×3): qty 1

## 2021-05-28 MED ORDER — ASENAPINE MALEATE 5 MG SL SUBL
10.0000 mg | SUBLINGUAL_TABLET | SUBLINGUAL | Status: DC
  Administered 2021-05-28 – 2021-05-29 (×2): 10 mg via SUBLINGUAL
  Filled 2021-05-28 (×2): qty 2

## 2021-05-28 MED ORDER — ONDANSETRON HCL 4 MG PO TABS
4.0000 mg | ORAL_TABLET | Freq: Three times a day (TID) | ORAL | Status: DC | PRN
  Administered 2021-05-28: 4 mg via ORAL
  Filled 2021-05-28: qty 1

## 2021-05-28 NOTE — ED Notes (Signed)
Asked pt to change out. Pt denied, charge told me to ask again later

## 2021-05-28 NOTE — ED Notes (Signed)
Pt's mother called this RN back and informed RN that pt agreed to go to [facility]. Informed mom that pt got upset after talking to her and mom states she heard it over the phone but patient did agree to go to [facility].

## 2021-05-28 NOTE — ED Notes (Signed)
Pt is dressed out and wanded by security. Pt belongings are in cabinet 16-18.

## 2021-05-28 NOTE — ED Notes (Signed)
Pt's mom talking to pt now.

## 2021-05-28 NOTE — BH Assessment (Addendum)
[facility] Assessment Progress Note  Per Caryn Bee, NP, this pt requires psychiatric hospitalization at this time.  Pt presents voluntarily, however, EDP Jacalyn Lefevre, MD finds that pt meets criteria for IVC, which she has initiated.  IVC documents have been faxed to [facility], and at [employer] confirms receipt.  He has since faxed Findings and Custody Order to this Clinical research associate.   Of duty police officer has completed Return of Service.  At 13:24 Shahon calls from [facility] to report that pt has been accepted to their main campus by Dr Estill Cotta.  Fredna Dow concurs with this decision.  Dr Particia Nearing and pt's nurse, Hale Bogus, have been notified, and Hale Bogus agrees to call report to [phone] or [phone].  Pt is to be transported via [facility].  Doylene Canning Behavioral Health Coordinator [phone]  Addendum:  Pt's mother, Ervine Witucki, is his legal guardian with letter of guardianship found in his EPIC record.  Letter has been sent to [facility], along with IVC documents.  At 15:28 I spoke to her, notifying her of pt's disposition.  Doylene Canning, [place] Behavioral Health Coordinator [phone]

## 2021-05-28 NOTE — ED Notes (Signed)
Received pt, alert awake, oriented to place and person. Pt unable to answer why he's here, and did not respond to SI/HI questions. Pt randomly talks to himself and yells occasionally. Stated his name and place, delayed response noted.

## 2021-05-28 NOTE — ED Notes (Signed)
Pt is observed to be hearing/seeing things that are not there. He has been talking to and arguing with things that are not there.

## 2021-05-28 NOTE — ED Provider Notes (Signed)
[facility]-EMERGENCY DEPT Provider Note   CSN: [id number] Arrival date & time: 05/28/21  0451     History Chief Complaint  Patient presents with   mental health check    Brandon [name] is a 30 y.o. male.  The history is provided by the patient, the police and a parent.  Brandon [name] is a 30 y.o. male who presents to the Emergency Department complaining of psychiatric evaluation. He has a history of schizophrenia, multiple sclerosis, pulmonary embolism. His mother called EMS tonight because he awoke abruptly from sleep screaming and he was hitting the dresser and walls. His mother believes that he was hallucinating and that was making him act out. He was telling the voices to leave him alone. This episode lasted about five minutes. She reports that recently he has experienced significantly poor sleep and was recently seen by psychiatry with plan to stop his trazodone and start hydroxyzine. The medication has been ordered but has not arrived yet from the pharmacy. She also reports of persistent and worsening hallucinations. She is afraid to take him in public due to him having episodes of punching out objects that are not present. When asked why he is in the emergency department patient states that he awoke from sleep and he was not sure what was going on. When asked if he is hallucinating patient does not respond. He is unsure his medications or medical problems. He denies any tobacco or alcohol use. He does state that he uses occasional marijuana. He denies any SI, HI.        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   Falls 09/13/2020   Unspecified intellectual disabilities 01/27/2020   Migraine headache 04/18/2019   Generalized weakness    History of pulmonary embolism 08/12/2017   Multiple sclerosis exacerbation (HCC) 07/30/2017   Left-sided weakness 01/18/2014   Multiple sclerosis (HCC) 05/20/2013   Unsteady gait 11/23/2012   HTN (hypertension) 07/04/2011   Ataxia 07/02/2011   Obesity, Class III, BMI 40-49.9 (morbid obesity) (HCC) 01/14/2010   ADHD 01/14/2010    Past Surgical History:  Procedure Laterality Date   NO PAST SURGERIES     None     RADIOLOGY WITH ANESTHESIA N/A 01/16/2021   Procedure: MRI WITH ANESTHESIA CERVICAL AND THORASIC SPINE WITH AND WITHOUT CONTRAST;  Surgeon: Radiologist, Medication, MD;  Location: [facility] OR;  Service: Radiology;  Laterality: N/A;   TOOTH EXTRACTION N/A 06/24/2019   Procedure: DENTAL RESTORATION/EXTRACTION OF TEETH NUMBER ONE, SIXTEEN, SEVENTEEN, NINETEEN, THIRTY-TWO;  Surgeon: Ocie Doyne, DDS;  Location: [facility] OR;  Service: Oral Surgery;  Laterality: N/A;       Family History  Problem Relation Age of Onset   Diabetes Mother    ADD / ADHD Brother     Social History   Tobacco Use   Smoking status: Every  Day    Packs/day: 0.25    Types: Cigarettes   Smokeless tobacco: Never   Tobacco comments:    2 cigarettes a day  Vaping Use   Vaping Use: Never used  Substance Use Topics   Alcohol use: Not Currently    Alcohol/week: 0.0 standard drinks    Comment: "A little bit"    Drug use: Not Currently    Types: Marijuana    Comment: Last used: unknown     Home Medications Prior to Admission medications   Medication Sig Start Date End Date Taking? Authorizing Provider  acetaminophen (TYLENOL) 500 MG tablet Take 1 tablet  (500 mg total) by mouth every 6 (six) hours as needed. 06/21/20   Fayrene Helper, PA-C  amantadine (SYMMETREL) 100 MG capsule Take 100 mg by mouth 2 (two) times daily. 05/27/21   [provider]  amLODipine (NORVASC) 5 MG tablet Take 5 mg by mouth daily. 09/06/20   [provider]  apixaban (ELIQUIS) 5 MG TABS tablet Take 5 mg by mouth 2 (two) times daily.     [provider]  ARIPiprazole (ABILIFY) 15 MG tablet Take 1 tablet (15 mg total) by mouth daily. 03/29/21 04/28/21  Roselle Locus, MD  Asenapine Maleate 10 MG SUBL Place 1 tablet under the tongue. 05/27/21   [provider]  docusate sodium (COLACE) 100 MG capsule Take 400 mg by mouth at bedtime.     [provider]  hydrOXYzine (ATARAX/VISTARIL) 25 MG tablet Take 25 mg by mouth 3 (three) times daily. 05/27/21   [provider]  latanoprost (XALATAN) 0.005 % ophthalmic solution INSTILL 1 DROP INTO BOTH EYES EVERY NIGHT Patient taking differently: Place 1 drop into both eyes at bedtime. 11/23/20 11/23/21    loratadine (CLARITIN) 10 MG tablet Take 10 mg by mouth daily as needed for allergies.    [provider]  LORazepam (ATIVAN) 1 MG tablet Take 1 mg by mouth 2 (two) times daily. 05/27/21   [provider]  LUMIGAN 0.01 % SOLN Place 1 drop into both eyes at bedtime. 04/04/21   [provider]  metFORMIN (GLUCOPHAGE-XR) 500 MG 24 hr tablet Take 500 mg by mouth 2 (two) times daily. 04/16/21   [provider]  metoprolol succinate (TOPROL-XL) 25 MG 24 hr tablet Take 1 tablet (25 mg total) by mouth daily. 03/29/21 04/28/21  Roselle Locus, MD  Multiple Vitamin (MULTIVITAMIN WITH MINERALS) TABS tablet Take 1 tablet by mouth daily.    [provider]  nicotine (NICODERM CQ - DOSED IN MG/24 HOURS) 14 mg/24hr patch Place 1 patch (14 mg total) onto the skin daily. 03/28/21   Roselle Locus, MD  topiramate (TOPAMAX) 25 MG tablet TAKE ONE TABLET  BY MOUTH TWICE A DAY Patient taking differently: Take 25 mg by mouth 2 (two) times daily. 03/19/21   Glean Salvo, NP  traZODone (DESYREL) 100 MG tablet Take 200 mg by mouth at bedtime. 04/03/21   [provider]  gabapentin (NEURONTIN) 400 MG capsule Take 400 mg by mouth 3 (three) times daily. 12/26/20 01/14/21  [provider]  propranolol (INDERAL) 10 MG tablet Take 1 tablet (10 mg total) by mouth 2 (two) times daily. 05/03/15 01/03/20  Charm Rings, NP    Allergies    Patient has no known allergies.  Review of Systems   Review of Systems  All other systems reviewed and are negative.  Physical Exam Updated Vital Signs BP (!) 133/110 (BP Location: Left  Arm)   Pulse 87   Temp 98.4 F (36.9 C) (Oral)   Resp 18   Ht 5\' 11"  (1.803 m)   Wt (!) 145.2 kg   SpO2 98%   BMI 44.63 kg/m   Physical Exam Vitals and nursing note reviewed.  Constitutional:      Appearance: He is well-developed.  HENT:     Head: Normocephalic and atraumatic.  Cardiovascular:     Rate and Rhythm: Normal rate and regular rhythm.  Pulmonary:     Effort: Pulmonary effort is normal. No respiratory distress.  Musculoskeletal:        General: No tenderness.  Skin:    General: Skin is warm and dry.  Neurological:     Mental Status: He is alert and oriented to person, place, and time.  Psychiatric:     Comments: Quiet speech. Inappropriate affect with intermittent laughing. Avoids eye contact.    ED Results / Procedures / Treatments   Labs (all labs ordered are listed, but only abnormal results are displayed) Labs Reviewed  COMPREHENSIVE METABOLIC PANEL - Abnormal; Notable for the following components:      Result Value   AST 13 (*)    All other components within normal limits  CBC WITH DIFFERENTIAL/PLATELET - Abnormal; Notable for the following components:   MCH 25.4 (*)    All other components within normal limits  RESP PANEL BY RT-PCR (FLU A&B, COVID) ARPGX2  ETHANOL  RAPID  URINE DRUG SCREEN, HOSP PERFORMED    EKG None  Radiology No results found.  Procedures Procedures   Medications Ordered in ED Medications  risperiDONE (RISPERDAL M-TABS) disintegrating tablet 2 mg (has no administration in time range)    And  LORazepam (ATIVAN) tablet 1 mg (has no administration in time range)    And  ziprasidone (GEODON) injection 20 mg (has no administration in time range)  ondansetron (ZOFRAN) tablet 4 mg (has no administration in time range)  LORazepam (ATIVAN) tablet 1 mg (1 mg Oral Given 05/28/21 05/30/21)    ED Course  I have reviewed the triage vital signs and the nursing notes.  Pertinent labs & imaging results that were available during my care of the patient were reviewed by me and considered in my medical decision making (see chart for details).    MDM Rules/Calculators/A&P                           patient with history of schizophrenia here for evaluation following outburst at home with visual and audible hallucinations. On ED arrival patient is calm but does appear to be responding to internal stimuli. Will check screening labs given his underlying health issues. Following labs plan for psychiatry consult for further medication and recommendations.  Labs are near his baseline.  Pt has been medically cleared for psychiatric treatment.   Final Clinical Impression(s) / ED Diagnoses Final diagnoses:  None    Rx / DC Orders ED Discharge Orders     None        [id number], MD 05/28/21 [phone]

## 2021-05-28 NOTE — ED Notes (Signed)
Pt was asked to change out into the wine-colored scrubs. He was not receptive to changing.

## 2021-05-28 NOTE — Progress Notes (Signed)
Called f/u CBG to [place], Charity fundraiser, at [facility]. She stated that they were unable to take the patient because he received IM geodon today around 1443. They stated that the patient needed to be observed for 24 hours before they would be able to received the patient. Nettie Elm, RN was also concerned about patient's BP  Elsie Ra, Consulting civil engineer, called the [employer] at [facility] to confirm this information.   Per Consulting civil engineer, we will observe the patient 24 hours and then transfer the patient to [facility].

## 2021-05-28 NOTE — ED Triage Notes (Signed)
Pt BIB Police voluntarily. Pt states that he started a new medication and it makes him act out. Pt wants to balance out his medications.

## 2021-05-28 NOTE — ED Notes (Signed)
Unable to obtain Blood Pressure from patient due to continuous movement. Will reassess at later time.

## 2021-05-28 NOTE — ED Notes (Signed)
Attempted to call report to [facility], per admission staff, nurse Nettie Elm is not available to take report at the moment but will call this RN back when she's ready.

## 2021-05-28 NOTE — ED Notes (Signed)
This nurse attempted to call report. Talking to [facility] nurse Nettie Elm and nurse wanted a recent glucose check and asked this RN to call her back. This RN called [employer] back and nurse Nettie Elm is no longer available to take report at this time.

## 2021-05-28 NOTE — ED Notes (Signed)
Pt attempted to provide a urine specimen, but was unable.

## 2021-05-28 NOTE — BH Assessment (Signed)
Comprehensive Clinical Assessment (CCA) Note  05/28/2021 Brandon [name] [id number]  Disposition: Per Brandon Fava FNP, patient is recommended for inpatient treatment.    [place] ED from 05/28/2021 in [facility] DEPT ED from 05/12/2021 in [facility] ED from 04/20/2021 in [place] No Risk High Risk No Risk      The patient demonstrates the following risk factors for suicide: Chronic risk factors for suicide include: psychiatric disorder of schizophrenia . Acute risk factors for suicide include: N/A. Protective factors for this patient include: positive social support, positive therapeutic relationship, and responsibility to others (children, family). Considering these factors, the overall suicide risk at this point appears to be low. Patient is not appropriate for outpatient follow up.  Brandon [name] is a 30 year old male presenting to [facility] with chief complaint of AVH. Patient is also under IVC. Patient reports that he woke up out his sleep and shouted "hush". Patient reports he was also talking in a foreign language. Patient does not identify who he was telling to hush. Patient reports he was woken up by hearing tapping on his window and the sound of a car pulling up in his yard. Patient unable to give many details about his history. Patient denies SI, HI, AVH and substance use. TTS observes patient responding to internal/external stimuli. Patient is observed looking away from the [place] and making statements apart from questions asked by TTS. Per chart review, RN staff also observes patient responding to stimuli.  Patient consents for TTS to contact his mother/guardian.  Mom reports that patient woke up around 4am this morning screaming "leave me alone" hitting on the walls and dresser which lasted about 5 min. Mom reports she had to call the police to take patient  to the ED due the intensity of his response to the hallucinations. Mom reports that patient daily life and sleep is affected by the hallucinations. Mom reports that she witnesses patient responding to the voices all day. Mom reports that patient is agitated by the voices, fighting the air and cursing. Mom reports that patient will try to sleep but his sleep is often interrupted. Mom reports that patient sees Dr. Loni [name] for medication management, he attends [employer] Day program and he has a care coordinator ([place]) with [employer]. Mom reports that she is not getting the help she needs with patient. Mom reports that she is in a wheelchair and sometimes she feels unsafe with patient at home. Mom reports a couple weeks ago she had to grab a knife to protect herself from patient. Mom reports that patient dad helped her with patient, but he died last year due to [place] and now she is having a hard time trying to take care of patient. Mom feels that patient needs to go into a facility or group home. Mom reports that patient was started on a new medication for sleep but patient has not started taking the medication yet.              Chief Complaint:  Chief Complaint  Patient presents with   mental health check   Visit Diagnosis:  Hallucination  Schizophrenia, 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[place])      CCA Screening, Triage and Referral (STR)  Patient Reported Information How did you hear about [place]? Family/Friend  What Is the Reason for Your Visit/Call Today? AVH  How Long Has This Been Causing You Problems? > than 6 months  What Do  You Feel Would Help You the Most Today? Treatment for Depression or other mood problem   Have You Recently Had Any Thoughts About Hurting Yourself? No  Are You Planning to Commit Suicide/Harm Yourself At This time? No   Have you Recently Had Thoughts About [place]? No  Are You Planning to Harm Someone at This Time? No  Explanation: No data recorded  Have You  Used Any Alcohol or Drugs in the Past 24 Hours? No  How Long Ago Did You Use Drugs or Alcohol? No data recorded What Did You Use and How Much? No data recorded  Do You Currently Have a Therapist/Psychiatrist? Yes  Name of Therapist/Psychiatrist: Dr. Loni [name]   Have You Been Recently Discharged From Any Office Practice or Programs? No  Explanation of Discharge From Practice/Program: No data recorded    CCA Screening Triage Referral Assessment Type of Contact: Tele-Assessment  Telemedicine Service Delivery: Telemedicine service delivery: This service was provided via telemedicine using a 2-way, interactive audio and video technology  Is this Initial or Reassessment? Initial Assessment  Date Telepsych consult ordered in [facility]:  05/28/21  Time Telepsych consult ordered in [facility]:  1326  Location of Assessment: [facility] ED  Provider Location: [facility] Assessment Services   Collateral Involvement: Guardian   Does Patient Have a [place]? No data recorded Name and Contact of Legal Guardian: No data recorded If Minor and Not Living with Parent(s), Who has Custody? n/a  Is CPS involved or ever been involved? Never  Is APS involved or ever been involved? Never   Patient Determined To Be At Risk for Harm To Self or Others Based on Review of Patient Reported Information or Presenting Complaint? No  Method: No data recorded Availability of Means: No data recorded Intent: No data recorded Notification Required: No data recorded Additional Information for Danger to Others Potential: No data recorded Additional Comments for Danger to Others Potential: No data recorded Are There Guns or Other Weapons in Your Home? No data recorded Types of Guns/Weapons: No data recorded Are These Weapons Safely Secured?                            No data recorded Who Could Verify You Are Able To Have These Secured: No data recorded Do You Have any Outstanding Charges, Pending Court Dates,  Parole/Probation? No data recorded Contacted To Inform of Risk of Harm To Self or Others: No data recorded   Does Patient Present under Involuntary Commitment? Yes  IVC Papers Initial File Date: 05/28/21   [place] of Residence: [place]   Patient Currently Receiving the Following Services: Medication Management; Day Treatment   Determination of Need: Urgent (48 hours)   Options For Referral: Outpatient Therapy; Group Home     CCA Biopsychosocial Patient Reported Schizophrenia/Schizoaffective Diagnosis in Past: Yes   Strengths: UTA   Mental Health Symptoms Depression:   -- (uta)   Duration of Depressive symptoms:    Mania:   Racing thoughts   Anxiety:    Difficulty concentrating   Psychosis:   Grossly disorganized or catatonic behavior; Hallucinations   Duration of Psychotic symptoms:  Duration of Psychotic Symptoms: Greater than six months   Trauma:   N/A   Obsessions:   N/A   Compulsions:   N/A   Inattention:   N/A   Hyperactivity/Impulsivity:   N/A   Oppositional/Defiant Behaviors:   N/A   Emotional Irregularity:   Mood lability  Other Mood/Personality Symptoms:   N/A    Mental Status Exam Appearance and self-care  Stature:   Tall   Weight:   Overweight   Clothing:   Disheveled   Grooming:   Neglected   Cosmetic use:   None   Posture/gait:   Slumped   Motor activity:   Slowed   Sensorium  Attention:   Distractible; Confused   Concentration:   Scattered   Orientation:   Person; Place; Situation   Recall/memory:   Defective in Immediate; Defective in Short-term   Affect and Mood  Affect:   Anxious   Mood:   Anxious   Relating  Eye contact:   Fleeting   Facial expression:   Constricted   Attitude toward examiner:   Cooperative   Thought and Language  Speech flow:  Garbled; Paucity; Slurred; Slow; Articulation error   Thought content:   Appropriate to Mood and Circumstances    Preoccupation:   None (none observed)   Hallucinations:   Auditory; Visual   Organization:  No data recorded  [employer] of Knowledge:   Poor   Intelligence:   Needs investigation (Per chart IDD)   Abstraction:   Abstract   Judgement:   Impaired   Reality Testing:   Distorted   Insight:   Lacking   Decision Making:   Impulsive   Social Functioning  Social Maturity:   Impulsive   Social Judgement:   Heedless   Stress  Stressors:   Family conflict; Illness   Coping Ability:   Overwhelmed   Skill Deficits:   Activities of daily living; Decision making; Self-control; Responsibility   Supports:   Family; Friends/Service system     Religion: Religion/Spirituality Are You A Religious Person?: No How Might This Affect Treatment?: N/A  Leisure/Recreation: Leisure / Recreation Do You Have Hobbies?: Yes Leisure and Hobbies: Video games  Exercise/Diet: Exercise/Diet Do You Exercise?:  (uta) Have You Gained or Lost A Significant Amount of Weight in the Past Six Months?:  (uta) Do You Follow a Special Diet?:  (uta) Do You Have Any Trouble Sleeping?: Yes (uta)   CCA Employment/Education Employment/Work Situation: Employment / Work Situation Employment Situation: On disability Why is Patient on Disability: MS and MH How Long has Patient Been on Disability: Not assessed. Patient's Job has Been Impacted by Current Illness: No Has Patient ever Been in the [employer]?: No  Education: Education Is Patient Currently Attending School?: No Did You Product manager?: No Did You Have Any Difficulty At School?: No   CCA Family/Childhood History Family and Relationship History: Family history Marital status: Single Does patient have children?: No  Childhood History:  Childhood History By whom was/is the patient raised?: Mother Did patient suffer any verbal/emotional/physical/sexual abuse as a child?: No Did patient suffer from severe  childhood neglect?: No Has patient ever been sexually abused/assaulted/raped as an adolescent or adult?: No Was the patient ever a victim of a crime or a disaster?: No Witnessed domestic violence?: No Has patient been affected by domestic violence as an adult?: No  Child/Adolescent Assessment:     CCA Substance Use Alcohol/Drug Use: Alcohol / Drug Use Pain Medications: See MAR Prescriptions: See MAR Over the Counter: See MAR History of alcohol / drug use?: No history of alcohol / drug abuse                         ASAM's:  Six Dimensions of Multidimensional Assessment  Dimension 1:  Acute Intoxication and/or Withdrawal Potential:      Dimension 2:  Biomedical Conditions and Complications:      Dimension 3:  Emotional, Behavioral, or Cognitive Conditions and Complications:     Dimension 4:  Readiness to Change:     Dimension 5:  Relapse, Continued use, or Continued Problem Potential:     Dimension 6:  Recovery/Living Environment:     ASAM Severity Score:    ASAM Recommended Level of Treatment:     Substance use Disorder (SUD)    Recommendations for Services/Supports/Treatments: Recommendations for Services/Supports/Treatments Recommendations For Services/Supports/Treatments: Individual Therapy  Discharge Disposition:    DSM5 Diagnoses: Patient Active Problem List   Diagnosis Date Noted   Agitation 04/22/2021   Schizophrenia, chronic with acute exacerbation ([place]) 03/13/2021   Schizophrenia, 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[place]) 03/05/2021   Falls 09/13/2020   Unspecified intellectual disabilities 01/27/2020   Migraine headache 04/18/2019   Generalized weakness    History of pulmonary embolism 08/12/2017   Multiple sclerosis exacerbation ([place]) 07/30/2017   Left-sided weakness 01/18/2014   Multiple sclerosis ([place]) 05/20/2013   Unsteady gait 11/23/2012   HTN (hypertension) 07/04/2011   Ataxia 07/02/2011   Obesity, Class III, BMI 40-49.9 (morbid obesity) ([place]) 01/14/2010    ADHD 01/14/2010     Referrals to Alternative Service(s): Referred to Alternative Service(s):   Place:   Date:   Time:    Referred to Alternative Service(s):   Place:   Date:   Time:    Referred to Alternative Service(s):   Place:   Date:   Time:    Referred to Alternative Service(s):   Place:   Date:   Time:     Luther Redo, [facility]

## 2021-05-28 NOTE — ED Notes (Signed)
Pt started to get upset while talking to mother, punching wall and yelling while in room. Security officers at the bedside to assist.

## 2021-05-28 NOTE — Progress Notes (Addendum)
Pt no longer Accepted to [facility] 05/29/21 due to IM Geodon today at 1443. Kiristin Ryan, RN was informed by [facility] that pt would need observed for 24 hours before they would be able to accept pt.  Pt meets inpatient criteria per Caryn Bee, NP -CSW learned that pt no longer has a bed offer and re-faxed referral to the following out of network facilities:   [facility] Provider Address Phone [facility]  [address] [place] [address] [phone]  [facility]  [address] [place] [address] [phone]  [facility]  [address] [place]ora [place], [place] [address] [phone]  [facility]  [address], [place] [address] [phone]  [facility]  [address] [place], [place] [address] [phone]  [facility]  [address] [place] [address] [phone]  [facility]  [address], [place] [address] [phone]  [facility]  [address] [place] [address] [phone]  [facility]  [address], [place] [address] [phone]  [facility]  [address] [place] [address] [phone]  [facility]  [address] [place], [place] [address] [phone]  [facility]  [address], [place] [address] [phone]  [facility]  [address] [place] [address] [phone]  [facility]  [address], [place] [address] [phone]     Kelton Pillar, LCSWA 05/28/2021 @ 11:38 PM

## 2021-05-29 DIAGNOSIS — F201 Disorganized schizophrenia: Secondary | ICD-10-CM | POA: Diagnosis not present

## 2021-05-29 LAB — CBG MONITORING, ED: Glucose-Capillary: 107 mg/dL — ABNORMAL HIGH (ref 70–99)

## 2021-05-29 NOTE — Progress Notes (Signed)
Spoke with Sue Lush at [facility] Notified her that I gave report to Casimiro Needle and Kathryne Sharper is on their way.

## 2021-05-29 NOTE — Progress Notes (Signed)
05/29/2021 1522  Report given to Casimiro Needle at [facility] [phone].

## 2021-05-29 NOTE — Progress Notes (Signed)
05/29/2021  1528  Called Sheriff [phone]. Sheriff on there way now.

## 2021-05-29 NOTE — ED Provider Notes (Signed)
Emergency Medicine Observation Re-evaluation Note  Brandon [name] is a 30 y.o. male, seen on rounds today.  Pt initially presented to the ED for complaints of mental health check Currently, the patient is sleeping.  Physical Exam  BP 137/86 (BP Location: Left Arm)   Pulse 68   Temp (!) 97.3 F (36.3 C) (Oral)   Resp 18   Ht 5\' 11"  (1.803 m)   Wt (!) 145.2 kg   SpO2 100%   BMI 44.63 kg/m  Physical Exam General: Sleeping, nondistressed Cardiac: Extremities well perfused Lungs: Breathing is even and unlabored Psych: Deferred  ED Course / MDM  EKG:   I have reviewed the labs performed to date as well as medications administered while in observation.  Recent changes in the last 24 hours include initially excepted to [facility] but this was delayed for 24 hours following IM Geodon, given at 2:20 PM yesterday.  Plan  Current plan is for inpatient psychiatric admission. Brandon [name] is under involuntary commitment.      Brandon Lawrence, MD 05/29/21 [phone]

## 2021-05-29 NOTE — ED Notes (Signed)
Pt threw table at the wall; was redirected quickly and calmed down

## 2021-05-29 NOTE — Consult Note (Signed)
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 male patient admitted to [facility] ED for monitoring visual hallucinations.  Patient IVC, and was acutely psychotic after he was responding to internal stimuli, and presenting with paranoid ideation.  On reassessment today patient was observed to be sleeping, however did awake easily, and respond appropriately.  Patient is able to tell me he understands he is going to [facility], for hospitalization is safe to be and agrees with this plan at this time.  He further denies any acute psychiatric concerns.  He denies any side effects and or adverse reactions to IM psychotropic medications he received.  Patient remains under IVC at this time, as he continues to be a danger to himself due to his acute psychiatric presentation and decompensation of his schizophrenia.  Patient continues to meet inpatient criteria at this time, has been tentatively accepted to [facility].  HPI:  Brandon [name] is a 30 year old male presenting to [facility] with chief complaint of AVH. Patient is also under IVC. Patient reports that he woke up out his sleep and shouted "hush". Patient reports he was also talking in a foreign language. Patient does not identify who he was telling to hush. Patient reports he was woken up by hearing tapping on his window and the sound of a car pulling up in his yard. Patient unable to give many details about his history. Patient denies SI, HI, AVH and substance use. TTS observes patient responding to internal/external stimuli. Patient is observed looking away from the [place] and making statements apart from questions asked by TTS. Per chart review, RN staff  also observes patient responding to stimuli.  Patient consents for TTS to contact his mother/guardian.   Collateral:  Mom reports that patient woke up around 4am this morning screaming "leave me alone" hitting on the walls and dresser which lasted about 5 min. Mom reports she had to call the police to take patient to the ED due the intensity of his response to the hallucinations. Mom reports that patient daily life and sleep is affected by the hallucinations. Mom reports that she witnesses patient responding to the voices all day. Mom reports that patient is agitated by the voices, fighting the air and cursing. Mom reports that patient will try to sleep but his sleep is often interrupted. Mom reports that patient sees Dr. Loni Muse for medication management, he attends [employer] [facility] and he has a care coordinator ([place]) with [employer]. Mom reports that she is not getting the help she needs with patient. Mom reports that she is in a wheelchair and sometimes she feels unsafe with patient at home. Mom reports a couple weeks ago she had to grab a knife to protect herself from patient. Mom reports that patient dad helped her with patient, but he died last year due to [place] and now she is having a hard time trying to take care of patient. Mom feels that patient needs to go into a facility or group home. Mom reports that patient was started on a new medication for sleep but patient has not started taking the medication yet.               Past Psychiatric History:  See below  Risk to Self: No Risk to Others: No Prior Inpatient Therapy: Yes Prior Outpatient Therapy: Yes  Past Medical History:  Past Medical History:  Diagnosis Date   ADHD (attention deficit hyperactivity disorder)    Bipolar 1 disorder (HCC)    Chronic back pain    Chronic constipation    Chronic neck pain    Hypertension    Multiple sclerosis ([place]) 05/20/2013   left sided weakness, dysarthria   Non-compliance    Obesity     Pulmonary embolism ([place])    Schizophrenia (HCC)    Stroke ([place])    left sided deficits - pt's mother denies this    Past Surgical History:  Procedure Laterality Date   NO PAST SURGERIES     None     RADIOLOGY WITH ANESTHESIA N/A 01/16/2021   Procedure: MRI WITH ANESTHESIA CERVICAL AND THORASIC SPINE WITH AND WITHOUT CONTRAST;  Surgeon: Radiologist, Medication, MD;  Location: [facility];  Service: Radiology;  Laterality: N/A;   TOOTH EXTRACTION N/A 06/24/2019   Procedure: DENTAL RESTORATION/EXTRACTION OF TEETH NUMBER ONE, SIXTEEN, SEVENTEEN, NINETEEN, THIRTY-TWO;  Surgeon: Diona Browner, DDS;  Location: [facility];  Service: Oral Surgery;  Laterality: N/A;   Family History:  Family History  Problem Relation Age of Onset   Diabetes Mother    ADD / ADHD Brother    Family Psychiatric  History: See above Social History:  Social History   Substance and Sexual Activity  Alcohol Use Not Currently   Alcohol/week: 0.0 standard drinks   Comment: "A little bit"      Social History   Substance and Sexual Activity  Drug Use Not Currently   Types: Marijuana   Comment: Last used: unknown     Social History   Socioeconomic History   Marital status: Single    Spouse name: Not on file   Number of children: 0   Years of education: 11th   Highest education level: Not on file  Occupational History   Occupation: unemployed    Employer: [employer]    Comment: Disbaled  Tobacco Use   Smoking status: Every Day    Packs/day: 0.25    Types: Cigarettes   Smokeless tobacco: Never   Tobacco comments:    2 cigarettes a day  Vaping Use   Vaping Use: Never used  Substance and Sexual Activity   Alcohol use: Not Currently    Alcohol/week: 0.0 standard drinks    Comment: "A little bit"    Drug use: Not Currently    Types: Marijuana    Comment: Last used: unknown    Sexual activity: Not on file  Other Topics Concern   Not on file  Social History Narrative   Patient lives at home with his  mother.   Disabled.   Education 11 th grade .   Right handed.   Drinks caffeine occassionally   Social Determinants of Radio broadcast assistant Strain: Not on file  Food Insecurity: Not on file  Transportation Needs: Not on file  Physical Activity: Not on file  Stress: Not on file  Social Connections: Not on file   Additional Social History:    Allergies:  No Known Allergies  Labs:  Results for orders placed or performed during the hospital encounter of 05/28/21 (from the past 48 hour(s))  Comprehensive metabolic panel     Status: Abnormal   Collection Time: 05/28/21  5:48 AM  Result Value Ref Range   Sodium 137 135 -  145 mmol/L   Potassium 3.7 3.5 - 5.1 mmol/L   Chloride 108 98 - 111 mmol/L   CO2 22 22 - 32 mmol/L   Glucose, Bld 98 70 - 99 mg/dL    Comment: Glucose reference range applies only to samples taken after fasting for at least 8 hours.   BUN 13 6 - 20 mg/dL   Creatinine, Ser 4.19 0.61 - 1.24 mg/dL   Calcium 9.2 8.9 - 62.2 mg/dL   Total Protein 7.1 6.5 - 8.1 g/dL   Albumin 4.4 3.5 - 5.0 g/dL   AST 13 (L) 15 - 41 U/L   ALT 30 0 - 44 U/L   Alkaline Phosphatase 69 38 - 126 U/L   Total Bilirubin 0.5 0.3 - 1.2 mg/dL   GFR, Estimated >29 >79 mL/min    Comment: (NOTE) Calculated using the CKD-EPI Creatinine Equation (2021)    Anion gap 7 5 - 15    Comment: Performed at [facility], [address], [place], [place] [address]  CBC with Differential     Status: Abnormal   Collection Time: 05/28/21  5:48 AM  Result Value Ref Range   WBC 7.7 4.0 - 10.5 K/uL   RBC 5.71 4.22 - 5.81 MIL/uL   Hemoglobin 14.5 13.0 - 17.0 g/dL   HCT 94.1 74.0 - 81.4 %   MCV 81.8 80.0 - 100.0 fL   MCH 25.4 (L) 26.0 - 34.0 pg   MCHC 31.0 30.0 - 36.0 g/dL   RDW 48.1 85.6 - 31.4 %   Platelets 193 150 - 400 K/uL   nRBC 0.0 0.0 - 0.2 %   Neutrophils Relative % 69 %   Neutro Abs 5.4 1.7 - 7.7 K/uL   Lymphocytes Relative 20 %   Lymphs Abs 1.5 0.7 - 4.0 K/uL    Monocytes Relative 7 %   Monocytes Absolute 0.5 0.1 - 1.0 K/uL   Eosinophils Relative 2 %   Eosinophils Absolute 0.1 0.0 - 0.5 K/uL   Basophils Relative 1 %   Basophils Absolute 0.1 0.0 - 0.1 K/uL   Immature Granulocytes 1 %   Abs Immature Granulocytes 0.05 0.00 - 0.07 K/uL    Comment: Performed at [facility], [address], [place], [place] [address]  Ethanol     Status: None   Collection Time: 05/28/21  5:48 AM  Result Value Ref Range   Alcohol, Ethyl (B) <10 <10 mg/dL    Comment: (NOTE) Lowest detectable limit for serum alcohol is 10 mg/dL.  For medical purposes only. Performed at [facility], [address] [place], [place] [address]   Resp Panel by RT-PCR (Flu A&B, Covid) Nasopharyngeal Swab     Status: None   Collection Time: 05/28/21  7:26 AM   Specimen: Nasopharyngeal Swab; Nasopharyngeal(NP) swabs in vial transport medium  Result Value Ref Range   SARS Coronavirus 2 by RT PCR NEGATIVE NEGATIVE    Comment: (NOTE) SARS-CoV-2 target nucleic acids are NOT DETECTED.  The SARS-CoV-2 RNA is generally detectable in upper respiratory specimens during the acute phase of infection. The lowest concentration of SARS-CoV-2 viral copies this assay can detect is 138 copies/mL. A negative result does not preclude SARS-Cov-2 infection and should not be used as the sole basis for treatment or other patient management decisions. A negative result may occur with  improper specimen collection/handling, submission of specimen other than nasopharyngeal swab, presence of viral mutation(s) within the areas targeted by this assay, and inadequate number  of viral copies(<138 copies/mL). A negative result must be combined with clinical observations, patient history, and epidemiological information. The expected result is Negative.  Fact Sheet for Patients:  BloggerCourse.com  Fact Sheet for Healthcare Providers:   SeriousBroker.it  This test is no t yet approved or cleared by the [place] FDA and  has been authorized for detection and/or diagnosis of SARS-CoV-2 by FDA under an Emergency Use Authorization (EUA). This EUA will remain  in effect (meaning this test can be used) for the duration of the COVID-19 declaration under Section 564(b)(1) of the Act, 21 U.S.C.section 360bbb-3(b)(1), unless the authorization is terminated  or revoked sooner.       Influenza A by PCR NEGATIVE NEGATIVE   Influenza B by PCR NEGATIVE NEGATIVE    Comment: (NOTE) The Xpert Xpress SARS-CoV-2/FLU/RSV plus assay is intended as an aid in the diagnosis of influenza from Nasopharyngeal swab specimens and should not be used as a sole basis for treatment. Nasal washings and aspirates are unacceptable for Xpert Xpress SARS-CoV-2/FLU/RSV testing.  Fact Sheet for Patients: BloggerCourse.com  Fact Sheet for Healthcare Providers: SeriousBroker.it  This test is not yet approved or cleared by the [place] FDA and has been authorized for detection and/or diagnosis of SARS-CoV-2 by FDA under an Emergency Use Authorization (EUA). This EUA will remain in effect (meaning this test can be used) for the duration of the COVID-19 declaration under Section 564(b)(1) of the Act, 21 U.S.C. section 360bbb-3(b)(1), unless the authorization is terminated or revoked.  Performed at [facility], [address], [place], [place] [address]   CBG monitoring, ED     Status: None   Collection Time: 05/28/21  7:07 PM  Result Value Ref Range   Glucose-Capillary 97 70 - 99 mg/dL    Comment: Glucose reference range applies only to samples taken after fasting for at least 8 hours.  CBG monitoring, ED     Status: Abnormal   Collection Time: 05/29/21  8:05 AM  Result Value Ref Range   Glucose-Capillary 107 (H) 70 - 99 mg/dL    Comment:  Glucose reference range applies only to samples taken after fasting for at least 8 hours.    Medications:  Current Facility-Administered Medications  Medication Dose Route Frequency Provider Last Rate Last Admin   amantadine (SYMMETREL) capsule 100 mg  100 mg Oral BID Maryagnes Amos, FNP   100 mg at 05/28/21 2050   amLODipine (NORVASC) tablet 5 mg  5 mg Oral Daily Maryagnes Amos, FNP   5 mg at 05/28/21 1338   apixaban (ELIQUIS) tablet 5 mg  5 mg Oral BID Maryagnes Amos, FNP   5 mg at 05/28/21 2050   ARIPiprazole (ABILIFY) tablet 15 mg  15 mg Oral Daily Maryagnes Amos, FNP   15 mg at 05/28/21 1338   asenapine (SAPHRIS) sublingual tablet 10 mg  10 mg Sublingual Q24H Maryagnes Amos, FNP   10 mg at 05/28/21 1337   LORazepam (ATIVAN) tablet 1 mg  1 mg Oral BID Maryagnes Amos, FNP   1 mg at 05/28/21 2053   metFORMIN (GLUCOPHAGE-XR) 24 hr tablet 500 mg  500 mg Oral BID WC Maryagnes Amos, FNP   500 mg at 05/28/21 1338   metoprolol succinate (TOPROL-XL) 24 hr tablet 25 mg  25 mg Oral Daily Maryagnes Amos, FNP   25 mg at 05/28/21 1338   multivitamin with minerals tablet 1 tablet  1 tablet Oral Daily Caryn Bee  S, FNP   1 tablet at 05/28/21 1338   ondansetron (ZOFRAN) tablet 4 mg  4 mg Oral Q8H PRN Quintella Reichert, MD   4 mg at 05/28/21 2051   risperiDONE (RISPERDAL M-TABS) disintegrating tablet 2 mg  2 mg Oral Q8H PRN Quintella Reichert, MD   2 mg at 05/28/21 2050   topiramate (TOPAMAX) tablet 25 mg  25 mg Oral BID Suella Broad, FNP   25 mg at 05/28/21 2051   traZODone (DESYREL) tablet 200 mg  200 mg Oral QHS Suella Broad, FNP   200 mg at 05/28/21 2051   Current Outpatient Medications  Medication Sig Dispense Refill   acetaminophen (TYLENOL) 500 MG tablet Take 1 tablet (500 mg total) by mouth every 6 (six) hours as needed. (Patient taking differently: Take 500 mg by mouth every 6 (six) hours as needed for mild  pain.) 30 tablet 0   amantadine (SYMMETREL) 100 MG capsule Take 100 mg by mouth 2 (two) times daily. 0800,1900     amLODipine (NORVASC) 5 MG tablet Take 5 mg by mouth daily.     apixaban (ELIQUIS) 5 MG TABS tablet Take 5 mg by mouth 2 (two) times daily.      ARIPiprazole (ABILIFY) 15 MG tablet Take 1 tablet (15 mg total) by mouth daily. 30 tablet 0   Asenapine Maleate 10 MG SUBL Place 10 mg under the tongue daily. @ 1445 daily     docusate sodium (COLACE) 100 MG capsule Take 400 mg by mouth at bedtime.      loratadine (CLARITIN) 10 MG tablet Take 10 mg by mouth daily as needed for allergies.     LORazepam (ATIVAN) 1 MG tablet Take 1 mg by mouth 2 (two) times daily.     LUMIGAN 0.01 % SOLN Place 1 drop into both eyes at bedtime.     metFORMIN (GLUCOPHAGE-XR) 500 MG 24 hr tablet Take 500 mg by mouth 2 (two) times daily.     Multiple Vitamin (MULTIVITAMIN WITH MINERALS) TABS tablet Take 1 tablet by mouth daily.     nicotine (NICODERM CQ - DOSED IN MG/24 HOURS) 14 mg/24hr patch Place 1 patch (14 mg total) onto the skin daily. 28 patch 0   topiramate (TOPAMAX) 25 MG tablet TAKE ONE TABLET BY MOUTH TWICE A DAY (Patient taking differently: Take 25 mg by mouth 2 (two) times daily.) 60 tablet 4   traZODone (DESYREL) 100 MG tablet Take 200 mg by mouth at bedtime.     hydrOXYzine (ATARAX/VISTARIL) 25 MG tablet Take 25 mg by mouth 3 (three) times daily.     latanoprost (XALATAN) 0.005 % ophthalmic solution INSTILL 1 DROP INTO BOTH EYES EVERY NIGHT (Patient not taking: Reported on 05/28/2021) 7.5 mL 10   metoprolol succinate (TOPROL-XL) 25 MG 24 hr tablet Take 1 tablet (25 mg total) by mouth daily. 30 tablet 0    Musculoskeletal: Strength & Muscle Tone: within normal limits Gait & Station: normal Patient leans: N/A  Psychiatric Specialty Exam:  Presentation  General Appearance: Appropriate for Environment  Eye Contact:Good  Speech:Clear and Coherent; Slow  Speech  Volume:Normal  Handedness:Right   Mood and Affect  Mood:Dysphoric  Affect:Congruent   Thought Process  Thought Processes:Coherent  Descriptions of Associations:Circumstantial  Orientation:Full (Time, Place and Person)  Thought Content:WDL  History of Schizophrenia/Schizoaffective disorder:Yes  Duration of Psychotic Symptoms:Greater than six months  Hallucinations:No data recorded Ideas of Reference:None  Suicidal Thoughts:No data recorded Homicidal Thoughts:No data recorded  Sensorium  Memory:Immediate Fair; Recent  Fair  Judgment:Fair  Insight:Fair; Present   Community education officer  Concentration:Fair  Attention Span:Fair  L[place]   Psychomotor Activity  Psychomotor Activity: No data recorded  Assets  Assets:Communication Skills; Housing; Leisure Time; Resilience; Social Support   Sleep  Sleep: No data recorded   Physical Exam: Physical Exam Vitals and nursing note reviewed. Exam conducted with a chaperone present.  Constitutional:      General: He is not in acute distress.    Appearance: Normal appearance. He is not ill-appearing.  Pulmonary:     Effort: Pulmonary effort is normal.  Neurological:     Mental Status: He is alert and oriented to person, place, and time.  Psychiatric:        Attention and Perception: Attention normal. He perceives visual hallucinations. He does not perceive auditory hallucinations.        Mood and Affect: Affect is flat.        Speech: Speech is delayed.        Behavior: Behavior is cooperative.        Thought Content: Thought content normal. Thought content is not paranoid or delusional. Thought content does not include homicidal or suicidal ideation.        Judgment: Judgment is impulsive.   Review of Systems  Constitutional: Negative.   HENT: Negative.    Eyes: Negative.   Respiratory: Negative.    Cardiovascular: Negative.   Gastrointestinal: Negative.    Genitourinary: Negative.   Musculoskeletal: Negative.   Skin: Negative.   Neurological: Negative.   Endo/Heme/Allergies: Negative.   Psychiatric/Behavioral:  Negative for depression (Stable), substance abuse and suicidal ideas. Hallucinations: Denies.Nervous/anxious: Stable. Insomnia: Denies.   Blood pressure 137/86, pulse 68, temperature (!) 97.3 F (36.3 C), temperature source Oral, resp. rate 18, height 5\' 11"  (1.803 m), weight (!) 145.2 kg, SpO2 100 %. Body mass index is 44.63 kg/m.  Treatment Plan Summary: Inpatient psychiatric admission. We have resumed home medications at this time. He has been tentatively accepted to [facility], all appropriate documentation have been faxed and received.  Disposition: Psychiatrically clear Recommend psychiatric Inpatient admission when medically cleared.   Suella Broad, FNP 05/29/2021 8:48 AM

## 2021-07-03 ENCOUNTER — Ambulatory Visit: Payer: Medicare Other | Admitting: Adult Health

## 2021-07-11 ENCOUNTER — Ambulatory Visit: Payer: Medicare Other | Admitting: Adult Health

## 2021-07-11 ENCOUNTER — Encounter: Payer: Self-pay | Admitting: Adult Health

## 2021-07-11 ENCOUNTER — Ambulatory Visit (INDEPENDENT_AMBULATORY_CARE_PROVIDER_SITE_OTHER): Payer: Medicare Other | Admitting: Adult Health

## 2021-07-11 VITALS — BP 119/82 | HR 73 | Ht 74.0 in | Wt 329.8 lb

## 2021-07-11 DIAGNOSIS — G35 Multiple sclerosis: Secondary | ICD-10-CM

## 2021-07-11 DIAGNOSIS — Z5181 Encounter for therapeutic drug level monitoring: Secondary | ICD-10-CM | POA: Diagnosis not present

## 2021-07-11 NOTE — Progress Notes (Signed)
PATIENT: Brandon [name] DOB: 03/27/1991  REASON FOR VISIT: follow up HISTORY FROM: patient PRIMARY NEUROLOGIST: Dr. Jannifer Franklin  HISTORY OF PRESENT ILLNESS: Today 07/11/21:  Brandon [name] is a 31 year old male with a history of multiple sclerosis, schizophrenia and gait disorder.  He returns today for follow-up.  He is currently on Ocrevus.  His last infusion was in September.  He is here today with his grandmother.  He denies any changes with his gait or balance.  Denies any recent falls.  No new numbness or weakness.  No changes with his vision.  No changes with the bowels or bladder.  He returns today for an evaluation  HISTORY Brandon [name] is a 31 year old right-handed black male with a history of multiple sclerosis, schizophrenia, and a gait disorder.  The patient has had a decline in his cognitive and physical capabilities within the last 3 months according to his mother who comes with him today.  He is having increasing frequency of hallucinations, within the last month his haloperidol has been increased.  He has had a decline in his ability to ambulate, he is having weakness of the axial muscles, not able to sit up straight, he will lean backwards.  He has had occasional stumbles or falls.  The patient was in the hospital on 13 September 2020, MRI of the brain and cervical spine were done without definite change seen, but the studies were of poor quality due to excessive movement artifact.  The patient returns here for evaluation, he remains on Ocrevus.   REVIEW OF SYSTEMS: Out of a complete 14 system review of symptoms, the patient complains only of the following symptoms, and all other reviewed systems are negative.  ALLERGIES: No Known Allergies  HOME MEDICATIONS: Outpatient Medications Prior to Visit  Medication Sig Dispense Refill   acetaminophen (TYLENOL) 500 MG tablet Take 1 tablet (500 mg total) by mouth every 6 (six) hours as needed. (Patient taking differently: Take 500 mg by mouth  every 6 (six) hours as needed for mild pain.) 30 tablet 0   amantadine (SYMMETREL) 100 MG capsule Take 100 mg by mouth 2 (two) times daily. 0800,1900     amLODipine (NORVASC) 5 MG tablet Take 5 mg by mouth daily.     apixaban (ELIQUIS) 5 MG TABS tablet Take 5 mg by mouth 2 (two) times daily.      ARIPiprazole (ABILIFY) 15 MG tablet Take 1 tablet (15 mg total) by mouth daily. 30 tablet 0   Asenapine Maleate 10 MG SUBL Place 10 mg under the tongue daily. @ 1445 daily     docusate sodium (COLACE) 100 MG capsule Take 400 mg by mouth at bedtime.      hydrOXYzine (ATARAX/VISTARIL) 25 MG tablet Take 25 mg by mouth 3 (three) times daily.     latanoprost (XALATAN) 0.005 % ophthalmic solution INSTILL 1 DROP INTO BOTH EYES EVERY NIGHT (Patient not taking: Reported on 05/28/2021) 7.5 mL 10   loratadine (CLARITIN) 10 MG tablet Take 10 mg by mouth daily as needed for allergies.     LORazepam (ATIVAN) 1 MG tablet Take 1 mg by mouth 2 (two) times daily.     LUMIGAN 0.01 % SOLN Place 1 drop into both eyes at bedtime.     metFORMIN (GLUCOPHAGE-XR) 500 MG 24 hr tablet Take 500 mg by mouth 2 (two) times daily.     metoprolol succinate (TOPROL-XL) 25 MG 24 hr tablet Take 1 tablet (25 mg total) by mouth daily. 30 tablet 0  Multiple Vitamin (MULTIVITAMIN WITH MINERALS) TABS tablet Take 1 tablet by mouth daily.     nicotine (NICODERM CQ - DOSED IN MG/24 HOURS) 14 mg/24hr patch Place 1 patch (14 mg total) onto the skin daily. 28 patch 0   topiramate (TOPAMAX) 25 MG tablet TAKE ONE TABLET BY MOUTH TWICE A DAY (Patient taking differently: Take 25 mg by mouth 2 (two) times daily.) 60 tablet 4   traZODone (DESYREL) 100 MG tablet Take 200 mg by mouth at bedtime.     No facility-administered medications prior to visit.    PAST MEDICAL HISTORY: Past Medical History:  Diagnosis Date   ADHD (attention deficit hyperactivity disorder)    Bipolar 1 disorder (HCC)    Chronic back pain    Chronic constipation    Chronic neck  pain    Hypertension    Multiple sclerosis ([place]) 05/20/2013   left sided weakness, dysarthria   Non-compliance    Obesity    Pulmonary embolism (HCC)    Schizophrenia (HCC)    Stroke ([place])    left sided deficits - pt's mother denies this    PAST SURGICAL HISTORY: Past Surgical History:  Procedure Laterality Date   NO PAST SURGERIES     None     RADIOLOGY WITH ANESTHESIA N/A 01/16/2021   Procedure: MRI WITH ANESTHESIA CERVICAL AND THORASIC SPINE WITH AND WITHOUT CONTRAST;  Surgeon: Radiologist, Medication, MD;  Location: [facility];  Service: Radiology;  Laterality: N/A;   TOOTH EXTRACTION N/A 06/24/2019   Procedure: DENTAL RESTORATION/EXTRACTION OF TEETH NUMBER ONE, SIXTEEN, SEVENTEEN, NINETEEN, THIRTY-TWO;  Surgeon: Diona Browner, DDS;  Location: [facility];  Service: Oral Surgery;  Laterality: N/A;    FAMILY HISTORY: Family History  Problem Relation Age of Onset   Diabetes Mother    ADD / ADHD Brother     SOCIAL HISTORY: Social History   Socioeconomic History   Marital status: Single    Spouse name: Not on file   Number of children: 0   Years of education: 11th   Highest education level: Not on file  Occupational History   Occupation: unemployed    Employer: [employer]    Comment: Disbaled  Tobacco Use   Smoking status: Every Day    Packs/day: 0.25    Types: Cigarettes   Smokeless tobacco: Never   Tobacco comments:    2 cigarettes a day  Vaping Use   Vaping Use: Never used  Substance and Sexual Activity   Alcohol use: Not Currently    Alcohol/week: 0.0 standard drinks    Comment: "A little bit"    Drug use: Not Currently    Types: Marijuana    Comment: Last used: unknown    Sexual activity: Not on file  Other Topics Concern   Not on file  Social History Narrative   Patient lives at home with his mother.   Disabled.   Education 11 th grade .   Right handed.   Drinks caffeine occassionally   Social Determinants of Radio broadcast assistant Strain:  Not on file  Food Insecurity: Not on file  Transportation Needs: Not on file  Physical Activity: Not on file  Stress: Not on file  Social Connections: Not on file  Intimate Partner Violence: Not on file      PHYSICAL EXAM  Vitals:   07/11/21 0954  BP: 119/82  Pulse: 73  Weight: (!) 329 lb 12.8 oz (149.6 kg)  Height: 6\' 2"  (1.88 m)   Body mass  index is 42.34 kg/m.  Generalized: Well developed, in no acute distress   Neurological examination  Mentation: Alert oriented to time, place, history taking. Follows all commands speech and language fluent Cranial nerve II-XII: Pupils were equal round reactive to light. Extraocular movements were full, visual field were full on confrontational test. Facial sensation and strength were normal. Uvula tongue midline. Head turning and shoulder shrug  were normal and symmetric. Motor: The motor testing reveals 5 over 5 strength of all 4 extremities. Good symmetric motor tone is noted throughout.  Sensory: Sensory testing is intact to soft touch on all 4 extremities. No evidence of extinction is noted.  Coordination: Cerebellar testing reveals good finger-nose-finger and heel-to-shin bilaterally.  Gait and station: Patient has wide base.  Tandem gait not attempted.  Gait slightly unsteady.   DIAGNOSTIC DATA (LABS, IMAGING, TESTING) - I reviewed patient records, labs, notes, testing and imaging myself where available.  Lab Results  Component Value Date   WBC 7.7 05/28/2021   HGB 14.5 05/28/2021   HCT 46.7 05/28/2021   MCV 81.8 05/28/2021   PLT 193 05/28/2021      Component Value Date/Time   NA 137 05/28/2021 0548   NA 142 11/01/2020 1436   K 3.7 05/28/2021 0548   CL 108 05/28/2021 0548   CO2 22 05/28/2021 0548   GLUCOSE 98 05/28/2021 0548   BUN 13 05/28/2021 0548   BUN 14 11/01/2020 1436   CREATININE 1.06 05/28/2021 0548   CALCIUM 9.2 05/28/2021 0548   PROT 7.1 05/28/2021 0548   PROT 6.8 11/01/2020 1436   ALBUMIN 4.4 05/28/2021  0548   ALBUMIN 4.9 11/01/2020 1436   AST 13 (L) 05/28/2021 0548   ALT 30 05/28/2021 0548   ALKPHOS 69 05/28/2021 0548   BILITOT 0.5 05/28/2021 0548   BILITOT 0.2 11/01/2020 1436   GFRNONAA >60 05/28/2021 0548   GFRAA >60 04/04/2020 2158   Lab Results  Component Value Date   CHOL 151 03/07/2021   HDL 32 (L) 03/07/2021   LDLCALC 88 03/07/2021   TRIG 154 (H) 03/07/2021   CHOLHDL 4.7 03/07/2021   Lab Results  Component Value Date   HGBA1C 5.5 03/07/2021   Lab Results  Component Value Date   VITAMINB12 738 01/15/2021   Lab Results  Component Value Date   TSH 1.424 01/15/2021      ASSESSMENT AND PLAN 31 y.o. year old male  has a past medical history of ADHD (attention deficit hyperactivity disorder), Bipolar 1 disorder ([place]), Chronic back pain, Chronic constipation, Chronic neck pain, Hypertension, Multiple sclerosis ([employer]) (05/20/2013), Non-compliance, Obesity, Pulmonary embolism ([place]), Schizophrenia ([place]), and Stroke ([place]). here with:  1.  Multiple sclerosis  Continue Ocrevus next infusion should be in March Blood work today, CBC, CMP, IgG, IgM IgA Patient is an established patient with Dr. Jannifer Franklin.  He will be transitioned to Dr. Felecia Shelling as his primary neurologist since Dr. Jannifer Franklin has retired.     Ward Givens, MSN, NP-C 07/11/2021, 9:47 AM [facility] [address] [place], [place] [address] [phone]

## 2021-07-11 NOTE — Patient Instructions (Signed)
Your Plan:  Continue Ocrevus Blood work today If your symptoms worsen or you develop new symptoms please let us know.    Thank you for coming to see us at [facility]. I hope we have been able to provide you high quality care today.  You may receive a patient satisfaction survey over the next few weeks. We would appreciate your feedback and comments so that we may continue to improve ourselves and the health of our patients.  

## 2021-07-12 LAB — IGG, IGA, IGM
IgA/Immunoglobulin A, Serum: 69 mg/dL — ABNORMAL LOW (ref 90–386)
IgG (Immunoglobin G), Serum: 629 mg/dL (ref 603–1613)
IgM (Immunoglobulin M), Srm: 10 mg/dL — ABNORMAL LOW (ref 20–172)

## 2021-07-12 LAB — COMPREHENSIVE METABOLIC PANEL
ALT: 39 IU/L (ref 0–44)
AST: 18 IU/L (ref 0–40)
Albumin/Globulin Ratio: 3.1 — ABNORMAL HIGH (ref 1.2–2.2)
Albumin: 4 g/dL — ABNORMAL LOW (ref 4.1–5.2)
Alkaline Phosphatase: 64 IU/L (ref 44–121)
BUN/Creatinine Ratio: 13 (ref 9–20)
BUN: 12 mg/dL (ref 6–20)
Bilirubin Total: 0.2 mg/dL (ref 0.0–1.2)
CO2: 24 mmol/L (ref 20–29)
Calcium: 8.9 mg/dL (ref 8.7–10.2)
Chloride: 110 mmol/L — ABNORMAL HIGH (ref 96–106)
Creatinine, Ser: 0.91 mg/dL (ref 0.76–1.27)
Globulin, Total: 1.3 g/dL — ABNORMAL LOW (ref 1.5–4.5)
Glucose: 88 mg/dL (ref 70–99)
Potassium: 4 mmol/L (ref 3.5–5.2)
Sodium: 144 mmol/L (ref 134–144)
Total Protein: 5.3 g/dL — ABNORMAL LOW (ref 6.0–8.5)
eGFR: 117 mL/min/{1.73_m2} (ref >59)

## 2021-07-12 LAB — CBC WITH DIFFERENTIAL/PLATELET
Basophils Absolute: 0 10*3/uL (ref 0.0–0.2)
Basos: 1 %
EOS (ABSOLUTE): 0.1 10*3/uL (ref 0.0–0.4)
Eos: 3 %
Hematocrit: 42.5 % (ref 37.5–51.0)
Hemoglobin: 13.4 g/dL (ref 13.0–17.7)
Immature Grans (Abs): 0 10*3/uL (ref 0.0–0.1)
Immature Granulocytes: 0 %
Lymphocytes Absolute: 1.1 10*3/uL (ref 0.7–3.1)
Lymphs: 26 %
MCH: 25.6 pg — ABNORMAL LOW (ref 26.6–33.0)
MCHC: 31.5 g/dL (ref 31.5–35.7)
MCV: 81 fL (ref 79–97)
Monocytes Absolute: 0.3 10*3/uL (ref 0.1–0.9)
Monocytes: 7 %
Neutrophils Absolute: 2.6 10*3/uL (ref 1.4–7.0)
Neutrophils: 63 %
Platelets: 231 10*3/uL (ref 150–450)
RBC: 5.24 x10E6/uL (ref 4.14–5.80)
RDW: 14.7 % (ref 11.6–15.4)
WBC: 4.2 10*3/uL (ref 3.4–10.8)

## 2021-07-16 ENCOUNTER — Encounter (HOSPITAL_COMMUNITY): Payer: Self-pay

## 2021-07-16 ENCOUNTER — Emergency Department (HOSPITAL_COMMUNITY): Payer: Medicare Other

## 2021-07-16 ENCOUNTER — Emergency Department (HOSPITAL_COMMUNITY)
Admission: EM | Admit: 2021-07-16 | Discharge: 2021-07-17 | Disposition: A | Discharge status: Home / Self Care | Payer: Medicare Other | Attending: Emergency Medicine | Admitting: Emergency Medicine

## 2021-07-16 ENCOUNTER — Other Ambulatory Visit: Payer: Self-pay

## 2021-07-16 DIAGNOSIS — R451 Restlessness and agitation: Secondary | ICD-10-CM

## 2021-07-16 DIAGNOSIS — Z7982 Long term (current) use of aspirin: Secondary | ICD-10-CM | POA: Diagnosis not present

## 2021-07-16 DIAGNOSIS — Z20822 Contact with and (suspected) exposure to covid-19: Secondary | ICD-10-CM | POA: Insufficient documentation

## 2021-07-16 DIAGNOSIS — R112 Nausea with vomiting, unspecified: Secondary | ICD-10-CM | POA: Diagnosis not present

## 2021-07-16 DIAGNOSIS — F209 Schizophrenia, unspecified: Secondary | ICD-10-CM | POA: Diagnosis not present

## 2021-07-16 DIAGNOSIS — R079 Chest pain, unspecified: Secondary | ICD-10-CM | POA: Insufficient documentation

## 2021-07-16 DIAGNOSIS — F10120 Alcohol abuse with intoxication, uncomplicated: Secondary | ICD-10-CM | POA: Insufficient documentation

## 2021-07-16 DIAGNOSIS — Z79899 Other long term (current) drug therapy: Secondary | ICD-10-CM | POA: Insufficient documentation

## 2021-07-16 DIAGNOSIS — R0602 Shortness of breath: Secondary | ICD-10-CM | POA: Diagnosis not present

## 2021-07-16 DIAGNOSIS — I10 Essential (primary) hypertension: Secondary | ICD-10-CM | POA: Diagnosis not present

## 2021-07-16 DIAGNOSIS — R1013 Epigastric pain: Secondary | ICD-10-CM | POA: Diagnosis present

## 2021-07-16 LAB — BASIC METABOLIC PANEL
Anion gap: 12 (ref 5–15)
BUN: 14 mg/dL (ref 6–20)
CO2: 20 mmol/L — ABNORMAL LOW (ref 22–32)
Calcium: 9.3 mg/dL (ref 8.9–10.3)
Chloride: 106 mmol/L (ref 98–111)
Creatinine, Ser: 1.07 mg/dL (ref 0.61–1.24)
GFR, Estimated: >60 mL/min (ref >60)
Glucose, Bld: 91 mg/dL (ref 70–99)
Potassium: 3.8 mmol/L (ref 3.5–5.1)
Sodium: 138 mmol/L (ref 135–145)

## 2021-07-16 LAB — CBC
HCT: 47.6 % (ref 39.0–52.0)
Hemoglobin: 14.4 g/dL (ref 13.0–17.0)
MCH: 25.5 pg — ABNORMAL LOW (ref 26.0–34.0)
MCHC: 30.3 g/dL (ref 30.0–36.0)
MCV: 84.2 fL (ref 80.0–100.0)
Platelets: 247 10*3/uL (ref 150–400)
RBC: 5.65 MIL/uL (ref 4.22–5.81)
RDW: 15.5 % (ref 11.5–15.5)
WBC: 6.9 10*3/uL (ref 4.0–10.5)
nRBC: 0 % (ref 0.0–0.2)

## 2021-07-16 LAB — TROPONIN I (HIGH SENSITIVITY)
Troponin I (High Sensitivity): 6 ng/L (ref <18)
Troponin I (High Sensitivity): 6 ng/L (ref <18)

## 2021-07-16 MED ORDER — IOHEXOL 350 MG/ML SOLN
80.0000 mL | Freq: Once | INTRAVENOUS | Status: AC | PRN
  Administered 2021-07-16: 80 mL via INTRAVENOUS

## 2021-07-16 NOTE — ED Provider Triage Note (Signed)
Emergency Medicine Provider Triage Evaluation Note  HERMON FIERSTEIN , a 31 y.o. male  was evaluated in triage.  Pt complains of chest pain, sob and cough x5 weeks.  Review of Systems  Positive: Chest pain, sob, cough Negative: fever  Physical Exam  BP (!) 151/88 (BP Location: Right Arm)    Pulse 90    Temp 98.7 F (37.1 C) (Oral)    Resp 20    SpO2 100%  Gen:   Awake, no distress   Resp:  Normal effort  MSK:   Moves extremities without difficulty  Other:  Tachycardic, lungs ctab  Medical Decision Making  Medically screening exam initiated at 6:34 PM.  Appropriate orders placed.  VEER JUBY was informed that the remainder of the evaluation will be completed by another provider, this initial triage assessment does not replace that evaluation, and the importance of remaining in the ED until their evaluation is complete.     Rodney Booze, [place] 07/16/21 1836

## 2021-07-16 NOTE — ED Triage Notes (Signed)
Pt arrives POV for eval of shortness of breath and "heart is bad" States chest pain since last week. Pt exceptionally poor historian and offers little to no further info to this Clinical research associate. Bizarre affect in triage

## 2021-07-16 NOTE — Social Work (Signed)
CSW was contacted by security as Pt had been found wandering in the halls near elevator stating that he wanted to go see his mother. Upon further investigation, Pt is checked in as Pt in the ED. Per other Pts in lobby, Pt was brought to ED by sheriff's department. CSW met with Pt in lobby. CSW is familiar with Pt from previous stays in ED. Pt's mother is currently in [place]. Pt reports that he has been staying with acquaintances but is unable to tell CSW the address. Pt requested that CSW call mother's phone as mother is known caregiver. Pt was able to give CSW mom's phone # from memory [phone].   Phone was off, CSW left message requesting call back to [facility] phone. Pt states that he has been working at the [employer]", that he has been doing his own grocery shopping with his own money and that he had been able to try to cook meals for himself while staying with acquaintances.

## 2021-07-16 NOTE — Progress Notes (Signed)
CSW attempted to follow up with [employer] dispatch to find out how Pt arrived at ED. Per dispatch Pt was not known to have been transported via [facility]. This evening.

## 2021-07-17 ENCOUNTER — Telehealth: Payer: Self-pay | Admitting: *Deleted

## 2021-07-17 ENCOUNTER — Ambulatory Visit (INDEPENDENT_AMBULATORY_CARE_PROVIDER_SITE_OTHER)
Admission: EM | Admit: 2021-07-17 | Discharge: 2021-07-17 | Disposition: A | Discharge status: Home / Self Care | Payer: Medicare Other | Source: Home / Self Care

## 2021-07-17 ENCOUNTER — Encounter (HOSPITAL_COMMUNITY): Payer: Self-pay | Admitting: Registered Nurse

## 2021-07-17 DIAGNOSIS — Z79899 Other long term (current) drug therapy: Secondary | ICD-10-CM | POA: Insufficient documentation

## 2021-07-17 DIAGNOSIS — F10929 Alcohol use, unspecified with intoxication, unspecified: Secondary | ICD-10-CM | POA: Diagnosis present

## 2021-07-17 DIAGNOSIS — F201 Disorganized schizophrenia: Secondary | ICD-10-CM | POA: Diagnosis present

## 2021-07-17 DIAGNOSIS — F10120 Alcohol abuse with intoxication, uncomplicated: Secondary | ICD-10-CM | POA: Insufficient documentation

## 2021-07-17 DIAGNOSIS — R1013 Epigastric pain: Secondary | ICD-10-CM | POA: Diagnosis not present

## 2021-07-17 DIAGNOSIS — F10920 Alcohol use, unspecified with intoxication, uncomplicated: Secondary | ICD-10-CM | POA: Diagnosis not present

## 2021-07-17 LAB — LIPASE, BLOOD: Lipase: 28 U/L (ref 11–51)

## 2021-07-17 LAB — RESP PANEL BY RT-PCR (FLU A&B, COVID) ARPGX2
Influenza A by PCR: NEGATIVE
Influenza B by PCR: NEGATIVE
SARS Coronavirus 2 by RT PCR: NEGATIVE

## 2021-07-17 MED ORDER — DROPERIDOL 2.5 MG/ML IJ SOLN: 5.0000 mg | Freq: Once | INTRAMUSCULAR | Status: DC

## 2021-07-17 MED ORDER — METFORMIN HCL ER 500 MG PO TB24: 500.0000 mg | ORAL_TABLET | Freq: Two times a day (BID) | ORAL | Status: DC

## 2021-07-17 MED ORDER — ALUM & MAG HYDROXIDE-SIMETH 200-200-20 MG/5ML PO SUSP: 30.0000 mL | Freq: Once | ORAL | Status: DC

## 2021-07-17 MED ORDER — NICOTINE 14 MG/24HR TD PT24
14.0000 mg | MEDICATED_PATCH | Freq: Every day | TRANSDERMAL | Status: DC
  Filled 2021-07-17: qty 1

## 2021-07-17 MED ORDER — LIDOCAINE VISCOUS HCL 2 % MT SOLN: 15.0000 mL | Freq: Once | OROMUCOSAL | Status: DC

## 2021-07-17 MED ORDER — AMANTADINE HCL 100 MG PO CAPS
100.0000 mg | ORAL_CAPSULE | Freq: Two times a day (BID) | ORAL | Status: DC
  Administered 2021-07-17: 100 mg via ORAL
  Filled 2021-07-17: qty 1

## 2021-07-17 MED ORDER — LORAZEPAM 1 MG PO TABS
1.0000 mg | ORAL_TABLET | Freq: Two times a day (BID) | ORAL | Status: DC
  Administered 2021-07-17: 1 mg via ORAL
  Filled 2021-07-17: qty 1

## 2021-07-17 MED ORDER — TRAZODONE HCL 50 MG PO TABS: 200.0000 mg | ORAL_TABLET | Freq: Every day | ORAL | Status: DC

## 2021-07-17 MED ORDER — APIXABAN 5 MG PO TABS
5.0000 mg | ORAL_TABLET | Freq: Two times a day (BID) | ORAL | Status: DC
  Administered 2021-07-17: 5 mg via ORAL
  Filled 2021-07-17: qty 1

## 2021-07-17 MED ORDER — LATANOPROST 0.005 % OP SOLN: 1.0000 [drp] | Freq: Every day | OPHTHALMIC | Status: DC

## 2021-07-17 MED ORDER — HYDROXYZINE HCL 25 MG PO TABS
25.0000 mg | ORAL_TABLET | Freq: Three times a day (TID) | ORAL | Status: DC
  Administered 2021-07-17: 25 mg via ORAL
  Filled 2021-07-17: qty 1

## 2021-07-17 MED ORDER — LORAZEPAM 2 MG/ML IJ SOLN
2.0000 mg | Freq: Once | INTRAMUSCULAR | Status: AC
Start: 2021-07-17 — End: 2021-07-17
  Administered 2021-07-17: 2 mg via INTRAMUSCULAR
  Filled 2021-07-17: qty 1

## 2021-07-17 MED ORDER — ARIPIPRAZOLE 10 MG PO TABS
15.0000 mg | ORAL_TABLET | Freq: Every day | ORAL | Status: DC
  Administered 2021-07-17: 15 mg via ORAL
  Filled 2021-07-17: qty 2

## 2021-07-17 MED ORDER — AMLODIPINE BESYLATE 5 MG PO TABS
5.0000 mg | ORAL_TABLET | Freq: Every day | ORAL | Status: DC
  Administered 2021-07-17: 5 mg via ORAL
  Filled 2021-07-17: qty 1

## 2021-07-17 MED ORDER — DROPERIDOL 2.5 MG/ML IJ SOLN: 2.5000 mg | Freq: Once | INTRAMUSCULAR | Status: DC

## 2021-07-17 MED ORDER — TOPIRAMATE 25 MG PO TABS
25.0000 mg | ORAL_TABLET | Freq: Two times a day (BID) | ORAL | Status: DC
  Administered 2021-07-17: 25 mg via ORAL
  Filled 2021-07-17: qty 1

## 2021-07-17 MED ORDER — ONDANSETRON HCL 4 MG/2ML IJ SOLN
4.0000 mg | Freq: Once | INTRAMUSCULAR | Status: AC
  Administered 2021-07-17: 4 mg via INTRAVENOUS
  Filled 2021-07-17: qty 2

## 2021-07-17 NOTE — Progress Notes (Signed)
Patient discharged home to [address], [address].

## 2021-07-17 NOTE — Progress Notes (Signed)
While rounding in ED. Pt. Was in doorway and wanted to talk.  Talked briefly with patient. Pt. York Spaniel he was feeling good today.Then he decided to walk away.  Chaplain available as needed.  Venida Jarvis, [place], [facility], Pager [phone]

## 2021-07-17 NOTE — ED Provider Notes (Signed)
Behavioral Health Urgent Care Medical Screening Exam  Patient Name: Brandon [name] MRN: [id number] Date of Evaluation: 07/17/21 Chief Complaint:   Diagnosis:  Final diagnoses:  Acute alcoholic intoxication without complication (HCC)    History of Present illness: Brandon [name] is a 31 y.o. male patient presented to [facility] as a walk in via [place] police with complaints that he needed someone to talk to a  Brandon [name], 31 y.o., male patient seen face to face by this provider, consulted with Dr. Earlene Plater; and chart reviewed on 07/17/21.  On evaluation Brandon [name] reports call police and told him he needed someone to talk to about getting his life together.  Patient denies suicidal/self-harm/homicidal ideations, psychosis, paranoia.  Patient reported he has been drinking tonight "drank a big boy" referring to large-size beer.  Patient reports he is staying with his grandmother because his mother is in the hospital.  Patient reports he has been compliant with his medications and just wanted someone to talk to.  Patient does appear intoxicated but becomes more clear the longer he sits in assessment room. During evaluation Brandon [name] is sitting up in chair (position) in no acute distress.  He is alert/oriented x 4; calm/cooperative; and mood congruent with affect.  He is speaking in a clear tone at moderate volume, and normal pace; with good eye contact.  His thought process is coherent and relevant; There is no indication that he is currently responding to internal/external stimuli or experiencing delusional thought content; and he has denied suicidal/self-harm/homicidal ideation, psychosis, and paranoia.  Patient has remained calm throughout assessment and has answered questions appropriately.  Patient informed he needed to avoid alcohol with his psychotropic medications.  Patient informed to follow-up with his current outpatient psychiatric provider   Psychiatric  Specialty Exam  Presentation  General Appearance:Appropriate for Environment; Disheveled  Eye Contact:Good  Speech:Clear and Coherent  Speech Volume:Normal  Handedness:Right   Mood and Affect  Mood:Euthymic  Affect:Congruent   Thought Process  Thought Processes:Coherent; Goal Directed  Descriptions of Associations:Intact  Orientation:Full (Time, Place and Person)  Thought Content:Logical  Diagnosis of Schizophrenia or Schizoaffective disorder in past: Yes  Duration of Psychotic Symptoms: Greater than six months  Hallucinations:None Last night observed by nursing staff responding to internal stimuli, punching self in the face, talking to the voices.  Ideas of Reference:None  Suicidal Thoughts:No  Homicidal Thoughts:No   Sensorium  Memory:Immediate Good; Recent Good  Judgment:Intact  Insight:Present   Executive Functions  Concentration:Good  Attention Span:Good  Recall:Good  Fund of Knowledge:Good  Language:Good   Psychomotor Activity  Psychomotor Activity:Normal   Assets  Assets:Communication Skills; Desire for Improvement; Financial Resources/Insurance; Housing; Social Support   Sleep  Sleep:Good  Number of hours: 6   Nutritional Assessment (For OBS and FBC admissions only) Has the patient had a weight loss or gain of 10 pounds or more in the last 3 months?: No Has the patient had a decrease in food intake/or appetite?: No Does the patient have dental problems?: No Does the patient have eating habits or behaviors that may be indicators of an eating disorder including binging or inducing vomiting?: No Has the patient recently lost weight without trying?: 0 Has the patient been eating poorly because of a decreased appetite?: 0 Malnutrition Screening Tool Score: 0    Physical Exam: Physical Exam Vitals and nursing note reviewed. Exam conducted with a chaperone present.  Constitutional:      General: He is not in acute distress.  Appearance: Normal appearance. He is not ill-appearing.  Cardiovascular:     Rate and Rhythm: Normal rate.  Pulmonary:     Effort: Pulmonary effort is normal.  Musculoskeletal:        General: Normal range of motion.     Cervical back: Normal range of motion.  Skin:    General: Skin is warm and dry.  Neurological:     Mental Status: He is alert and oriented to person, place, and time.  Psychiatric:        Attention and Perception: Attention and perception normal. He does not perceive auditory or visual hallucinations.        Mood and Affect: Mood and affect normal.        Speech: Speech normal.        Behavior: Behavior normal. Behavior is cooperative.        Thought Content: Thought content normal. Thought content is not paranoid or delusional. Thought content does not include homicidal or suicidal ideation.        Judgment: Judgment is impulsive.   Review of Systems  Constitutional: Negative.   HENT: Negative.    Eyes: Negative.   Respiratory: Negative.    Cardiovascular: Negative.   Gastrointestinal: Negative.   Genitourinary: Negative.   Musculoskeletal: Negative.   Skin: Negative.   Neurological: Negative.   Endo/Heme/Allergies: Negative.   Psychiatric/Behavioral:  Negative for depression, hallucinations and suicidal ideas (Reports he has been drinking beer today). The patient is not nervous/anxious.   Blood pressure (!) 142/87, pulse (!) 103, temperature 99 F (37.2 C), temperature source Oral, SpO2 100 %. There is no height or weight on file to calculate BMI.  Musculoskeletal: Strength & Muscle Tone: within normal limits Gait & Station: normal Patient leans: N/A   [facility] Discharge Disposition for Follow up and Recommendations: Based on my evaluation the patient does not appear to have an emergency medical condition and can be discharged with resources and follow up care in outpatient services for Medication Management, Individual Therapy, and Group  Therapy   Brandon Rolland, NP 07/17/2021, 4:41 PM

## 2021-07-17 NOTE — Telephone Encounter (Signed)
-----   Message from Butch Penny, NP sent at 07/17/2021  4:15 PM EST ----- Labwork unremarkable with except protein slightly low. Will monitor for now.

## 2021-07-17 NOTE — BH Assessment (Addendum)
Pt to [facility] with BHRT reporting he wants to talk with someone about getting his license and getting his life back together. Pt very disorganized and is staggering. Pt has also urinated on himself and per [facility] staff this happened awhile ago and he is refusing to change. Pt denies SI, hI, AVH. Reports drinking beer earlier today and denies other substance use. Contacted pt guardian about [facility] visit and disposition.    Pt is routine/urgent

## 2021-07-17 NOTE — ED Notes (Signed)
Pt ambulates back into room from the bathroom and is assisted back into bed. Pt is advised not to get up and 2 warm blankets was provided bed locked low in a safe position.

## 2021-07-17 NOTE — Telephone Encounter (Signed)
Called patient with lab results Spoke to mother (Checked DPR) Mother Verbalized understanding . Made the mother aware if she had any questions feel free to call office back. Mother thanked me for calling

## 2021-07-17 NOTE — ED Provider Notes (Signed)
[facility] Provider Note   CSN: [id number] Arrival date & time: 07/16/21  1807     History  Chief Complaint  Patient presents with   Shortness of Breath   Chest Pain    Brandon [name] is a 31 y.o. male.   Shortness of Breath Associated symptoms: chest pain   Chest Pain Associated symptoms: shortness of breath    31 year old male with a history of ADHD, hypertension, schizophrenia, bipolar disorder, multiple sclerosis, chronic back pain, prior PE who presents to the emergency department with epigastric abdominal pain.  The patient initially presented with shortness of breath.  He endorsed epigastric abdominal pain that has been intermittent for the past week.  He states "my reflux is acting up."  He felt like he "had an upset stomach."  He denies any chest pain at this time.  He was additionally noted to have a bizarre affect on presentation.  On chart review, the patient does have a history of schizophrenia and states he has not been taking his medications.  He has a guardian who is currently admitted to the hospital, [name] Ohanian.  Home Medications Prior to Admission medications   Medication Sig Start Date End Date Taking? Authorizing Provider  acetaminophen (TYLENOL) 500 MG tablet Take 1 tablet (500 mg total) by mouth every 6 (six) hours as needed. Patient taking differently: Take 500 mg by mouth every 6 (six) hours as needed for mild pain. 06/21/20  Yes Domenic Moras, PA-C  amLODipine (NORVASC) 5 MG tablet Take 5 mg by mouth daily. 09/06/20  Yes [provider]  apixaban (ELIQUIS) 5 MG TABS tablet Take 5 mg by mouth 2 (two) times daily.    Yes [provider]  Asenapine Maleate 10 MG SUBL Place 10 mg under the tongue daily. @ 1445 daily 05/27/21  Yes [provider]  docusate sodium (COLACE) 100 MG capsule Take 400 mg by mouth at bedtime.   Yes [provider]  haloperidol (HALDOL) 5 MG tablet Take 5 mg  by mouth 2 (two) times daily. 07/02/21  Yes [provider]  latanoprost (XALATAN) 0.005 % ophthalmic solution INSTILL 1 DROP INTO BOTH EYES EVERY NIGHT Patient taking differently: Place 1 drop into both eyes at bedtime. 11/23/20 11/23/21 Yes   loratadine (CLARITIN) 10 MG tablet Take 10 mg by mouth daily as needed for allergies.   Yes [provider]  metFORMIN (GLUCOPHAGE-XR) 500 MG 24 hr tablet Take 500 mg by mouth 2 (two) times daily. 04/16/21  Yes [provider]  Multiple Vitamin (MULTIVITAMIN WITH MINERALS) TABS tablet Take 1 tablet by mouth daily.   Yes [provider]  nicotine (NICODERM CQ - DOSED IN MG/24 HOURS) 14 mg/24hr patch Place 1 patch (14 mg total) onto the skin daily. 03/28/21  Yes Hill, Jackie Plum, MD  OLANZapine (ZYPREXA) 10 MG tablet Take 10 mg by mouth at bedtime. 07/02/21  Yes [provider]  amantadine (SYMMETREL) 100 MG capsule Take 100 mg by mouth 2 (two) times daily. 4098,1191 Patient not taking: Reported on 07/17/2021 05/27/21   [provider]  ARIPiprazole (ABILIFY) 15 MG tablet Take 1 tablet (15 mg total) by mouth daily. Patient not taking: Reported on 07/17/2021 03/29/21 05/28/21  Maida Sale, MD  hydrOXYzine (ATARAX/VISTARIL) 25 MG tablet Take 25 mg by mouth 3 (three) times daily. Patient not taking: Reported on 07/17/2021 05/27/21   [provider]  LORazepam (ATIVAN) 1 MG tablet Take 1 mg by mouth 2 (  two) times daily. Patient not taking: Reported on 07/17/2021 05/27/21   [provider]  LUMIGAN 0.01 % SOLN Place 1 drop into both eyes at bedtime. Patient not taking: Reported on 07/17/2021 04/04/21   [provider]  metoprolol succinate (TOPROL-XL) 25 MG 24 hr tablet Take 1 tablet (25 mg total) by mouth daily. Patient not taking: Reported on 07/17/2021 03/29/21 07/17/21  Maida Sale, MD  topiramate (TOPAMAX) 25 MG tablet TAKE ONE TABLET BY MOUTH TWICE A DAY Patient  not taking: Reported on 07/17/2021 03/19/21   Suzzanne Cloud, NP  traZODone (DESYREL) 100 MG tablet Take 200 mg by mouth at bedtime. Patient not taking: Reported on 07/17/2021 04/03/21   [provider]  gabapentin (NEURONTIN) 400 MG capsule Take 400 mg by mouth 3 (three) times daily. 12/26/20 01/14/21  [provider]  propranolol (INDERAL) 10 MG tablet Take 1 tablet (10 mg total) by mouth 2 (two) times daily. 05/03/15 01/03/20  Patrecia Pour, NP      Allergies    Patient has no known allergies.    Review of Systems   Review of Systems  Respiratory:  Positive for shortness of breath.   Cardiovascular:  Positive for chest pain.   Physical Exam Updated Vital Signs BP (!) 130/93 (BP Location: Right Arm)    Pulse 81    Temp 98 F (36.7 C) (Oral)    Resp 13    SpO2 100%  Physical Exam Vitals and nursing note reviewed.  Constitutional:      General: He is not in acute distress.    Appearance: He is well-developed.  HENT:     Head: Normocephalic and atraumatic.  Eyes:     Conjunctiva/sclera: Conjunctivae normal.  Cardiovascular:     Rate and Rhythm: Normal rate and regular rhythm.     Heart sounds: No murmur heard. Pulmonary:     Effort: Pulmonary effort is normal. No respiratory distress.     Breath sounds: Normal breath sounds.  Abdominal:     Palpations: Abdomen is soft.     Tenderness: There is no abdominal tenderness.  Musculoskeletal:        General: No swelling.     Cervical back: Neck supple.  Skin:    General: Skin is warm and dry.     Capillary Refill: Capillary refill takes less than 2 seconds.  Neurological:     Mental Status: He is alert.  Psychiatric:        Attention and Perception: He is inattentive.        Mood and Affect: Mood normal. Affect is flat.        Behavior: Behavior is agitated and withdrawn. Behavior is cooperative.        Thought Content: Thought content does not include homicidal or suicidal ideation.     Comments: Appears to be  responding to internal stimuli, episode of agitation in the emergency department    ED Results / Procedures / Treatments   Labs (all labs ordered are listed, but only abnormal results are displayed) Labs Reviewed  BASIC METABOLIC PANEL - Abnormal; Notable for the following components:      Result Value   CO2 20 (*)    All other components within normal limits  CBC - Abnormal; Notable for the following components:   MCH 25.5 (*)    All other components within normal limits  RESP PANEL BY RT-PCR (FLU A&B, COVID) ARPGX2  LIPASE, BLOOD  TROPONIN I (HIGH SENSITIVITY)  TROPONIN  I (HIGH SENSITIVITY)    EKG EKG Interpretation  Date/Time:  Tuesday July 16 2021 18:27:35 EST Ventricular Rate:  84 PR Interval:  118 QRS Duration: 88 QT Interval:  334 QTC Calculation: 394 R Axis:   24 Text Interpretation: Normal sinus rhythm Normal ECG When compared with ECG of 20-Apr-2021 22:48, PREVIOUS ECG IS PRESENT Confirmed by Regan Lemming ([id number]) on 07/17/2021 4:32:42 AM  Radiology DG Chest 1 View  Result Date: 07/16/2021 CLINICAL DATA:  Chest pain, cough, shortness of breath EXAM: CHEST  1 VIEW COMPARISON:  04/20/2021 FINDINGS: Low lung volumes. Vascular congestion and bibasilar atelectasis. Heart is normal size. No effusions or acute bony abnormality. IMPRESSION: Low lung volumes with vascular congestion and bibasilar atelectasis. Electronically Signed   By: Rolm Baptise M.D.   On: 07/16/2021 19:26   CT Angio Chest PE W and/or Wo Contrast  Result Date: 07/16/2021 CLINICAL DATA:  Concern for pulmonary embolism. EXAM: CT ANGIOGRAPHY CHEST WITH CONTRAST TECHNIQUE: Multidetector CT imaging of the chest was performed using the standard protocol during bolus administration of intravenous contrast. Multiplanar CT image reconstructions and MIPs were obtained to evaluate the vascular anatomy. RADIATION DOSE REDUCTION: This exam was performed according to the departmental dose-optimization program which  includes automated exposure control, adjustment of the mA and/or kV according to patient size and/or use of iterative reconstruction technique. CONTRAST:  62m OMNIPAQUE IOHEXOL 350 MG/ML SOLN COMPARISON:  Chest CT dated 08/11/2017 and radiograph dated 07/16/2021. FINDINGS: Cardiovascular: There is no cardiomegaly or pericardial effusion. The thoracic aorta is unremarkable. The origins of the great vessels of the aortic arch appear patent as visualized. Evaluation of the pulmonary arteries is limited due to respiratory motion artifact and suboptimal opacification and timing of the contrast. No definite pulmonary artery embolus identified. Mediastinum/Nodes: No hilar or mediastinal adenopathy. The esophagus is grossly unremarkable. No mediastinal fluid collection. Lungs/Pleura: The lungs are clear. There is no pleural effusion pneumothorax. The central airways are patent. Upper Abdomen: Partially visualized indeterminate 2 cm right renal hypodense lesion. Musculoskeletal: No chest wall abnormality. No acute or significant osseous findings. Review of the MIP images confirms the above findings. IMPRESSION: No acute intrathoracic pathology. No CT evidence of pulmonary artery embolus. Electronically Signed   By: AAnner CreteM.D.   On: 07/16/2021 22:09    Procedures Procedures    Medications Ordered in ED Medications  alum & mag hydroxide-simeth (MAALOX/MYLANTA) 200-200-20 MG/5ML suspension 30 mL (0 mLs Oral Hold 07/17/21 0515)    And  lidocaine (XYLOCAINE) 2 % viscous mouth solution 15 mL (0 mLs Oral Hold 07/17/21 0515)  droperidol (INAPSINE) 2.5 MG/ML injection 2.5 mg (0 mg Intravenous Hold 07/17/21 0646)  amantadine (SYMMETREL) capsule 100 mg (has no administration in time range)  amLODipine (NORVASC) tablet 5 mg (has no administration in time range)  apixaban (ELIQUIS) tablet 5 mg (has no administration in time range)  ARIPiprazole (ABILIFY) tablet 15 mg (has no administration in time range)   hydrOXYzine (ATARAX) tablet 25 mg (has no administration in time range)  latanoprost (XALATAN) 0.005 % ophthalmic solution 1 drop (has no administration in time range)  LORazepam (ATIVAN) tablet 1 mg (has no administration in time range)  metFORMIN (GLUCOPHAGE-XR) 24 hr tablet 500 mg (has no administration in time range)  nicotine (NICODERM CQ - dosed in mg/24 hours) patch 14 mg (has no administration in time range)  topiramate (TOPAMAX) tablet 25 mg (has no administration in time range)  traZODone (DESYREL) tablet 200 mg (has no administration in time range)  iohexol (OMNIPAQUE) 350 MG/ML injection 80 mL (80 mLs Intravenous Contrast Given 07/16/21 2159)  ondansetron (ZOFRAN) injection 4 mg (4 mg Intravenous Given 07/17/21 0513)  LORazepam (ATIVAN) injection 2 mg (2 mg Intramuscular Given 07/17/21 0510)    ED Course/ Medical Decision Making/ A&P                           Medical Decision Making  31 year old male with a history of ADHD, hypertension, schizophrenia, bipolar disorder, multiple sclerosis, chronic back pain, prior PE who presents to the emergency department with epigastric abdominal pain.  The patient initially presented with shortness of breath.  He endorsed epigastric abdominal pain that has been intermittent for the past week.  He states "my reflux is acting up."  He felt like he "had an upset stomach."  He denies any chest pain at this time.  He was additionally noted to have a bizarre affect on presentation.  On chart review, the patient does have a history of schizophrenia and states he has not been taking his medications.  He has a guardian who is currently admitted to the hospital, [name] Scarola.  On arrival, the patient was afebrile, hemodynamically stable, mildly hypertensive, BP 151/88, saturating 100% on room air.  Physical exam generally unremarkable with an abdomen that was soft, nontender, nondistended on exam.  Patient presenting with epigastric abdominal discomfort and  a complaint of reflux.  He did have an episode of nausea and vomiting in the emergency department with 1 episode of NBNB emesis.  Differential diagnosis includes gastritis, enteritis.  Lower suspicion for ACS or PE.  The patient had endorsed some shortness of breath but currently denies it.  He has a flat affect, history of schizophrenia and states that he has not been taking his medications.  Work-up initiated in triage for shortness of breath  to include an EKG which was without acute ischemic changes, chest x-ray and CT angio PE with and without contrast which revealed no acute intrathoracic pathology.  No evidence of pulmonary embolus.  The patient has troponins x2 which were negative, lipase which is normal, COVID-19 and influenza PCR negative, CBC without significant abnormalities, BMP normal with the exception of a mildly low bicarb at 20 without an elevated anion gap.  At this time, the patient's nausea and vomiting is most consistent with gastritis.  He was ordered droperidol for nausea, with viscous lidocaine and Maalox ordered.  Zofran administered prior to droperidol and droperidol was not given.  Patient presenting with flat affect, bizarre behavior and demonstrated an episode of significant agitation in the emergency department.  Concern for possible decompensated schizophrenia.  He did improve with IV Ativan 2 mg.  And had no further episodes of agitation.  [name] is currently in inpatient rehab after hospitalization.  She is his primary caregiver and guardian.  Social work note states "CSW was contacted by security as Pt had been found wandering in the halls near elevator stating that he wanted to go see his mother. Upon further investigation, Pt is checked in as Pt in the ED. Per other Pts in lobby, Pt was brought to ED by sheriff's department. CSW met with Pt in lobby. CSW is familiar with Pt from previous stays in ED. Pt's mother is currently in [place]. Pt reports that he has been  staying with acquaintances but is unable to tell CSW the address. Pt requested that CSW call mother's phone as mother is known caregiver. Pt was  able to give CSW mom's phone # from memory [phone].   Phone was off, CSW left message requesting call back to [facility] phone. Pt states that he has been working at the [employer]", that he has been doing his own grocery shopping with his own money and that he had been able to try to cook meals for himself while staying with acquaintances. "  At this time, the patient is stable, no further episodes of nausea or vomiting, requesting something to drink.  Low concern for acute intra-abdominal pathology.  Low concern for acute intrathoracic pathology.  I am concerned that the patient has not been taking his home medications with some concern for decompensated schizophrenia.  TTS consult placed.     Final Clinical Impression(s) / ED Diagnoses Final diagnoses:  Agitation  Schizophrenia, unspecified type ([place])  Epigastric abdominal pain  Nausea and vomiting, unspecified vomiting type    Rx / DC Orders ED Discharge Orders     None         Regan Lemming, MD 07/17/21 [phone]

## 2021-07-17 NOTE — Discharge Instructions (Addendum)
Follow up with your current psychiatric prvider

## 2021-07-17 NOTE — ED Notes (Signed)
Pt vomited all in the floor. Tech cleaned the floor and assisted pt back into bed.

## 2021-07-17 NOTE — ED Provider Notes (Signed)
Patient reevaluated this morning.  He is feeling much improved from his abdominal pain overnight.  He has been able to eat and drink without any issues.  He does not endorse any suicidal homicidal ideation.  Case management has been able to get in touch with family who states that they are able to pick them up.  She is not having any signs of acute mania.  He denies any hallucinations.  He appears pleasant.  Overall suspect that he is at his baseline.  Medical work-up has been unremarkable.  Does not appear to need any acute management from a psychiatric standpoint.  Discharged in ED in good condition with family.  This chart was dictated using voice recognition software.  Despite best efforts to proofread,  errors can occur which can change the documentation meaning.    Virgina Norfolk, DO 07/17/21 [phone]

## 2021-07-17 NOTE — Progress Notes (Signed)
CSW spoke with patients mother Brandon [name] to find out what the plan will be when her son is discharge. Patients mother stated a lady named Brandon [name] was providing care for patient while she has been in the hospital. Patients mother stated something happened yesterday and she had the police drop patient off on his grandmothers [name]. Patients mother stated that she upset with Brandon [name] because she used to clean her home and agreed to help her out with patient while she is in the hospital. Patients mother stated she is going to reach out to family to see if someone can pick him up. Patient also has a care coordinator with Brandon [name], [phone].   Patients grandmother, Brandon [name] was also present at the hospital visiting and asked CSW if she can help get patient a group home. Ms. [name] stated he is becoming a handful for the family. Ms. [name] stated she cannot care for him full time. CSW stated she would reach out to the care coordinator to see if he can start the process due to his mother being admitted into the hospital. Patients aunt Brandon [name], [phone] will be picking up patient.    Brandon [name], [phone]. CSW also left a message for him.

## 2021-07-17 NOTE — ED Notes (Signed)
Pt will not stay in the room keeps trying to walk down the halls. Pt will not follow instructions. Pt ambulated to the restroom with no assistance.

## 2021-07-30 ENCOUNTER — Telehealth (HOSPITAL_COMMUNITY): Payer: Self-pay | Admitting: Nurse Practitioner

## 2021-07-30 NOTE — BH Assessment (Signed)
Care Management - Follow Up [place] made contact with the patient's mother who is his guardian.  Per his mother, patient will follow up with established providers.

## 2021-08-12 ENCOUNTER — Ambulatory Visit (HOSPITAL_COMMUNITY)
Admission: EM | Admit: 2021-08-12 | Discharge: 2021-08-14 | Disposition: A | Discharge status: Home / Self Care | Payer: Medicare Other | Attending: Nurse Practitioner | Admitting: Nurse Practitioner

## 2021-08-12 DIAGNOSIS — K5909 Other constipation: Secondary | ICD-10-CM | POA: Diagnosis not present

## 2021-08-12 DIAGNOSIS — I10 Essential (primary) hypertension: Secondary | ICD-10-CM | POA: Diagnosis not present

## 2021-08-12 DIAGNOSIS — F32A Depression, unspecified: Secondary | ICD-10-CM | POA: Insufficient documentation

## 2021-08-12 DIAGNOSIS — G4700 Insomnia, unspecified: Secondary | ICD-10-CM | POA: Insufficient documentation

## 2021-08-12 DIAGNOSIS — R4689 Other symptoms and signs involving appearance and behavior: Secondary | ICD-10-CM | POA: Diagnosis present

## 2021-08-12 DIAGNOSIS — R450 Nervousness: Secondary | ICD-10-CM | POA: Diagnosis not present

## 2021-08-12 DIAGNOSIS — Z20822 Contact with and (suspected) exposure to covid-19: Secondary | ICD-10-CM | POA: Diagnosis not present

## 2021-08-12 DIAGNOSIS — F79 Unspecified intellectual disabilities: Secondary | ICD-10-CM

## 2021-08-12 DIAGNOSIS — Z79899 Other long term (current) drug therapy: Secondary | ICD-10-CM | POA: Insufficient documentation

## 2021-08-12 DIAGNOSIS — F201 Disorganized schizophrenia: Secondary | ICD-10-CM | POA: Insufficient documentation

## 2021-08-12 DIAGNOSIS — G35 Multiple sclerosis: Secondary | ICD-10-CM | POA: Insufficient documentation

## 2021-08-12 DIAGNOSIS — F909 Attention-deficit hyperactivity disorder, unspecified type: Secondary | ICD-10-CM | POA: Diagnosis present

## 2021-08-12 DIAGNOSIS — R4587 Impulsiveness: Secondary | ICD-10-CM | POA: Diagnosis not present

## 2021-08-12 DIAGNOSIS — R451 Restlessness and agitation: Secondary | ICD-10-CM | POA: Diagnosis present

## 2021-08-12 LAB — POC SARS CORONAVIRUS 2 AG -  ED: SARS Coronavirus 2 Ag: NEGATIVE

## 2021-08-12 MED ORDER — HYDROXYZINE HCL 25 MG PO TABS
25.0000 mg | ORAL_TABLET | Freq: Three times a day (TID) | ORAL | Status: DC | PRN
  Administered 2021-08-13 – 2021-08-14 (×3): 25 mg via ORAL
  Filled 2021-08-12 (×3): qty 1

## 2021-08-12 MED ORDER — OLANZAPINE 10 MG PO TABS
10.0000 mg | ORAL_TABLET | Freq: Every day | ORAL | Status: DC
  Administered 2021-08-12: 10 mg via ORAL
  Filled 2021-08-12: qty 1

## 2021-08-12 MED ORDER — ALUM & MAG HYDROXIDE-SIMETH 200-200-20 MG/5ML PO SUSP: 30.0000 mL | ORAL | Status: DC | PRN

## 2021-08-12 MED ORDER — LORAZEPAM 1 MG PO TABS
1.0000 mg | ORAL_TABLET | Freq: Once | ORAL | Status: AC
  Administered 2021-08-12: 1 mg via ORAL
  Filled 2021-08-12: qty 1

## 2021-08-12 MED ORDER — APIXABAN 5 MG PO TABS
5.0000 mg | ORAL_TABLET | Freq: Two times a day (BID) | ORAL | Status: DC
  Administered 2021-08-13 – 2021-08-14 (×3): 5 mg via ORAL
  Filled 2021-08-12 (×3): qty 1

## 2021-08-12 MED ORDER — MAGNESIUM HYDROXIDE 400 MG/5ML PO SUSP: 30.0000 mL | Freq: Every day | ORAL | Status: DC | PRN

## 2021-08-12 MED ORDER — AMLODIPINE BESYLATE 5 MG PO TABS
5.0000 mg | ORAL_TABLET | Freq: Every day | ORAL | Status: DC
  Administered 2021-08-13 – 2021-08-14 (×2): 5 mg via ORAL
  Filled 2021-08-12 (×2): qty 1

## 2021-08-12 MED ORDER — ACETAMINOPHEN 325 MG PO TABS: 650.0000 mg | ORAL_TABLET | Freq: Four times a day (QID) | ORAL | Status: DC | PRN

## 2021-08-12 NOTE — ED Provider Notes (Signed)
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 Creatinine Equation (2021)    Anion gap 05/28/2021 7  5 - 15 Final   Performed at [facility], [place] [address], [place], [place] [address]   WBC 05/28/2021 7.7  4.0 - 10.5 K/uL Final   RBC 05/28/2021 5.71  4.22 - 5.81 MIL/uL Final   Hemoglobin 05/28/2021 14.5  13.0 -  17.0 g/dL Final   HCT 05/28/2021 46.7  39.0 - 52.0 % Final   MCV  05/28/2021 81.8  80.0 - 100.0 fL Final   MCH 05/28/2021 25.4 (L)  26.0 - 34.0 pg Final   MCHC 05/28/2021 31.0  30.0 - 36.0 g/dL Final   RDW 05/28/2021 14.9  11.5 - 15.5 % Final   Platelets 05/28/2021 193  150 - 400 K/uL Final   nRBC 05/28/2021 0.0  0.0 - 0.2 % Final   Neutrophils Relative % 05/28/2021 69  % Final   Neutro Abs 05/28/2021 5.4  1.7 - 7.7 K/uL Final   Lymphocytes Relative 05/28/2021 20  % Final   Lymphs Abs 05/28/2021 1.5  0.7 - 4.0 K/uL Final   Monocytes Relative 05/28/2021 7  % Final   Monocytes Absolute 05/28/2021 0.5  0.1 - 1.0 K/uL Final   Eosinophils Relative 05/28/2021 2  % Final   Eosinophils Absolute 05/28/2021 0.1  0.0 - 0.5 K/uL Final   Basophils Relative 05/28/2021 1  % Final   Basophils Absolute 05/28/2021 0.1  0.0 - 0.1 K/uL Final   Immature Granulocytes 05/28/2021 1  % Final   Abs Immature Granulocytes 05/28/2021 0.05  0.00 - 0.07 K/uL Final   Performed at [facility], [place] [address], [place], [place] [address]   Alcohol, Ethyl (B) 05/28/2021 <10  <10 mg/dL Final   Comment: (NOTE) Lowest detectable limit for serum alcohol is 10 mg/dL.  For medical purposes only. Performed at [facility], [place] [address], [place], [place] [address]    SARS Coronavirus 2 by RT PCR 05/28/2021 NEGATIVE  NEGATIVE Final   Comment: (NOTE) SARS-CoV-2 target nucleic acids are NOT DETECTED.  The SARS-CoV-2 RNA is generally detectable in upper respiratory specimens during the acute phase of infection. The lowest concentration of SARS-CoV-2 viral copies this assay can detect is 138 copies/mL. A negative result does not preclude SARS-Cov-2 infection and should not be used as the sole basis for treatment or other patient management decisions. A negative result may occur with  improper specimen collection/handling, submission of specimen other than nasopharyngeal swab, presence of viral mutation(s) within the areas targeted by this assay, and  inadequate number of viral copies(<138 copies/mL). A negative result must be combined with clinical observations, patient history, and epidemiological information. The expected result is Negative.  Fact Sheet for Patients:  EntrepreneurPulse.com.au  Fact Sheet for Healthcare Providers:  IncredibleEmployment.be  This test is no                          t yet approved or cleared by the [place] FDA and  has been authorized for detection and/or diagnosis of SARS-CoV-2 by FDA under an Emergency Use Authorization (EUA). This EUA will remain  in effect (meaning this test can be used) for the duration of the COVID-19 declaration under Section 564(b)(1) of the Act, 21 U.S.C.section 360bbb-3(b)(1), unless the authorization is terminated  or revoked sooner.       Influenza A by PCR 05/28/2021 NEGATIVE  NEGATIVE Final   Influenza B by PCR 05/28/2021 NEGATIVE  NEGATIVE Final   Comment: (NOTE) The Xpert Xpress SARS-CoV-2/FLU/RSV plus assay is intended as an aid in the diagnosis of influenza from Nasopharyngeal swab specimens and should not be used as a sole basis for treatment. Nasal washings and aspirates are unacceptable for Xpert Xpress SARS-CoV-2/FLU/RSV testing.  Fact Sheet for Patients: EntrepreneurPulse.com.au  Fact Sheet for Healthcare Providers: IncredibleEmployment.be  This test is not yet approved or cleared by the [place] and has been authorized for detection and/or diagnosis of SARS-CoV-2 by FDA under an Emergency Use Authorization (EUA). This EUA will remain in effect (meaning this test can be used) for the duration of the COVID-19 declaration under Section 564(b)(1) of the Act, 21 U.S.C. section 360bbb-3(b)(1), unless the authorization is terminated or revoked.  Performed at [facility], [place] [address], [place], [place] [address]    Glucose-Capillary 05/28/2021 97   70 - 99 mg/dL Final   Glucose reference range applies only to samples taken after fasting for at least 8 hours.   Glucose-Capillary 05/29/2021 107 (H)  70 - 99 mg/dL Final   Glucose reference range applies only to samples taken after fasting for at least 8 hours.  Admission on 05/12/2021, Discharged on 05/13/2021  Component Date Value Ref Range Status   SARS Coronavirus 2 by RT PCR 05/12/2021 NEGATIVE  NEGATIVE Final   Comment: (NOTE) SARS-CoV-2 target nucleic acids are NOT DETECTED.  The SARS-CoV-2 RNA is generally detectable in upper respiratory specimens during the acute phase of infection. The lowest concentration of SARS-CoV-2 viral copies this assay can detect is 138 copies/mL. A negative result does not preclude SARS-Cov-2 infection and should not be used as the sole basis for treatment or other patient management decisions. A negative result may occur with  improper specimen collection/handling, submission of specimen other than nasopharyngeal swab, presence of viral mutation(s) within the areas targeted by this assay, and inadequate number of viral copies(<138 copies/mL). A negative result must be combined with clinical observations, patient history, and epidemiological information. The expected result is Negative.  Fact Sheet for Patients:  EntrepreneurPulse.com.au  Fact Sheet for Healthcare Providers:  IncredibleEmployment.be  This test is no                          t yet approved or cleared by the [place] FDA and  has been authorized for detection and/or diagnosis of SARS-CoV-2 by FDA under an Emergency Use Authorization (EUA). This EUA will remain  in effect (meaning this test can be used) for the duration of the COVID-19 declaration under Section 564(b)(1) of the Act, 21 U.S.C.section 360bbb-3(b)(1), unless the authorization is terminated  or revoked sooner.       Influenza A by PCR 05/12/2021 NEGATIVE  NEGATIVE Final    Influenza B by PCR 05/12/2021 NEGATIVE  NEGATIVE Final   Comment: (NOTE) The Xpert Xpress SARS-CoV-2/FLU/RSV plus assay is intended as an aid in the diagnosis of influenza from Nasopharyngeal swab specimens and should not be used as a sole basis for treatment. Nasal washings and aspirates are unacceptable for Xpert Xpress SARS-CoV-2/FLU/RSV testing.  Fact Sheet for Patients: EntrepreneurPulse.com.au  Fact Sheet for Healthcare Providers: IncredibleEmployment.be  This test is not yet approved or cleared by the [place] FDA and has been authorized for detection and/or diagnosis of SARS-CoV-2 by FDA under an Emergency Use Authorization (EUA). This EUA will remain in effect (meaning this test can be used) for the duration of the COVID-19 declaration under Section 564(b)(1) of the Act, 21 U.S.C. section 360bbb-3(b)(1), unless the authorization is terminated or revoked.  Performed at [facility], [place] [address], [place], [place] [address]    POC Amphetamine UR 05/12/2021 None Detected  NONE DETECTED (Cut Off Level 1000 ng/mL) Final   POC Secobarbital (BAR) 05/12/2021 None Detected  NONE DETECTED (Cut Off Level 300  ng/mL) Final   POC Buprenorphine (BUP) 05/12/2021 None Detected  NONE DETECTED (Cut Off Level 10 ng/mL) Final   POC Oxazepam (BZO) 05/12/2021 Positive (A)  NONE DETECTED (Cut Off Level 300 ng/mL) Final   POC Cocaine UR 05/12/2021 None Detected  NONE DETECTED (Cut Off Level 300 ng/mL) Final   POC Methamphetamine UR 05/12/2021 None Detected  NONE DETECTED (Cut Off Level 1000 ng/mL) Final   POC Morphine 05/12/2021 None Detected  NONE DETECTED (Cut Off Level 300 ng/mL) Final   POC Oxycodone UR 05/12/2021 None Detected  NONE DETECTED (Cut Off Level 100 ng/mL) Final   POC Methadone UR 05/12/2021 None Detected  NONE DETECTED (Cut Off Level 300 ng/mL) Final   POC Marijuana UR 05/12/2021 None Detected  NONE DETECTED (Cut Off Level 50 ng/mL)  Final   SARS Coronavirus 2 Ag 05/12/2021 Negative  Negative Final   SARSCOV2ONAVIRUS 2 AG 05/12/2021 NEGATIVE  NEGATIVE Final   Comment: (NOTE) SARS-CoV-2 antigen NOT DETECTED.   Negative results are presumptive.  Negative results do not preclude SARS-CoV-2 infection and should not be used as the sole basis for treatment or other patient management decisions, including infection  control decisions, particularly in the presence of clinical signs and  symptoms consistent with COVID-19, or in those who have been in contact with the virus.  Negative results must be combined with clinical observations, patient history, and epidemiological information. The expected result is Negative.  Fact Sheet for Patients: HandmadeRecipes.com.cy  Fact Sheet for Healthcare Providers: FuneralLife.at  This test is not yet approved or cleared by the [place] FDA and  has been authorized for detection and/or diagnosis of SARS-CoV-2 by FDA under an Emergency Use Authorization (EUA).  This EUA will remain in effect (meaning this test can be used) for the duration of  the COV                          ID-19 declaration under Section 564(b)(1) of the Act, 21 U.S.C. section 360bbb-3(b)(1), unless the authorization is terminated or revoked sooner.    Admission on 04/20/2021, Discharged on 04/23/2021  Component Date Value Ref Range Status   WBC 04/20/2021 8.0  4.0 - 10.5 K/uL Final   RBC 04/20/2021 5.01  4.22 - 5.81 MIL/uL Final   Hemoglobin 04/20/2021 13.0  13.0 - 17.0 g/dL Final   HCT 04/20/2021 41.8  39.0 - 52.0 % Final   MCV 04/20/2021 83.4  80.0 - 100.0 fL Final   MCH 04/20/2021 25.9 (L)  26.0 - 34.0 pg Final   MCHC 04/20/2021 31.1  30.0 - 36.0 g/dL Final   RDW 04/20/2021 14.6  11.5 - 15.5 % Final   Platelets 04/20/2021 225  150 - 400 K/uL Final   nRBC 04/20/2021 0.0  0.0 - 0.2 % Final   Neutrophils Relative % 04/20/2021 66  % Final   Neutro Abs  04/20/2021 5.3  1.7 - 7.7 K/uL Final   Lymphocytes Relative 04/20/2021 23  % Final   Lymphs Abs 04/20/2021 1.8  0.7 - 4.0 K/uL Final   Monocytes Relative 04/20/2021 7  % Final   Monocytes Absolute 04/20/2021 0.6  0.1 - 1.0 K/uL Final   Eosinophils Relative 04/20/2021 2  % Final   Eosinophils Absolute 04/20/2021 0.2  0.0 - 0.5 K/uL Final   Basophils Relative 04/20/2021 1  % Final   Basophils Absolute 04/20/2021 0.1  0.0 - 0.1 K/uL Final   Immature Granulocytes 04/20/2021 1  % Final  Abs Immature Granulocytes 04/20/2021 0.04  0.00 - 0.07 K/uL Final   Performed at [facility], [place] [address] [place], [place] [address]   Sodium 04/20/2021 138  135 - 145 mmol/L Final   Potassium 04/20/2021 3.5  3.5 - 5.1 mmol/L Final   Chloride 04/20/2021 107  98 - 111 mmol/L Final   CO2 04/20/2021 23  22 - 32 mmol/L Final   Glucose, Bld 04/20/2021 94  70 - 99 mg/dL Final   Glucose reference range applies only to samples taken after fasting for at least 8 hours.   BUN 04/20/2021 16  6 - 20 mg/dL Final   Creatinine, Ser 04/20/2021 1.00  0.61 - 1.24 mg/dL Final   Calcium 04/20/2021 9.2  8.9 - 10.3 mg/dL Final   GFR, Estimated 04/20/2021 >60  >60 mL/min Final   Comment: (NOTE) Calculated using the CKD-EPI Creatinine Equation (2021)    Anion gap 04/20/2021 8  5 - 15 Final   Performed at [facility], [place] [address], [place], [place] [address]   Troponin I (High Sensitivity) 04/20/2021 4  <18 ng/L Final   Comment: (NOTE) Elevated high sensitivity troponin I (hsTnI) values and significant  changes across serial measurements may suggest ACS but many other  chronic and acute conditions are known to elevate hsTnI results.  Refer to the Links section for chest pain algorithms and additional  guidance. Performed at [facility], [place] [address] [place], [place] [address]    Troponin I (High Sensitivity) 04/21/2021 4  <18 ng/L Final   Comment: (NOTE) Elevated high sensitivity troponin I  (hsTnI) values and significant  changes across serial measurements may suggest ACS but many other  chronic and acute conditions are known to elevate hsTnI results.  Refer to the "Links" section for chest pain algorithms and additional  guidance. Performed at [facility], [place] [address] [place], [place] [address]    SARS Coronavirus 2 by RT PCR 04/21/2021 NEGATIVE  NEGATIVE Final   Comment: (NOTE) SARS-CoV-2 target nucleic acids are NOT DETECTED.  The SARS-CoV-2 RNA is generally detectable in upper respiratory specimens during the acute phase of infection. The lowest concentration of SARS-CoV-2 viral copies this assay can detect is 138 copies/mL. A negative result does not preclude SARS-Cov-2 infection and should not be used as the sole basis for treatment or other patient management decisions. A negative result may occur with  improper specimen collection/handling, submission of specimen other than nasopharyngeal swab, presence of viral mutation(s) within the areas targeted by this assay, and inadequate number of viral copies(<138 copies/mL). A negative result must be combined with clinical observations, patient history, and epidemiological information. The expected result is Negative.  Fact Sheet for Patients:  EntrepreneurPulse.com.au  Fact Sheet for Healthcare Providers:  IncredibleEmployment.be  This test is no                          t yet approved or cleared by the [place] FDA and  has been authorized for detection and/or diagnosis of SARS-CoV-2 by FDA under an Emergency Use Authorization (EUA). This EUA will remain  in effect (meaning this test can be used) for the duration of the COVID-19 declaration under Section 564(b)(1) of the Act, 21 U.S.C.section 360bbb-3(b)(1), unless the authorization is terminated  or revoked sooner.       Influenza A by PCR 04/21/2021 NEGATIVE  NEGATIVE Final   Influenza B by PCR 04/21/2021  NEGATIVE  NEGATIVE Final   Comment: (NOTE) The Xpert Xpress SARS-CoV-2/FLU/RSV plus assay is intended as an aid in the diagnosis of influenza from Nasopharyngeal swab specimens and should not be used as a sole basis for treatment. Nasal washings and aspirates are unacceptable for Xpert Xpress SARS-CoV-2/FLU/RSV testing.  Fact Sheet for Patients: EntrepreneurPulse.com.au  Fact Sheet for Healthcare Providers: IncredibleEmployment.be  This test is not yet approved or cleared by the [place] FDA and has been authorized for detection and/or diagnosis of SARS-CoV-2 by FDA under an Emergency Use Authorization (EUA). This EUA will remain in effect (meaning this test can be used) for the duration of the COVID-19 declaration under Section 564(b)(1) of the Act, 21 U.S.C. section 360bbb-3(b)(1), unless the authorization is terminated or revoked.  Performed at [facility], [place] [address] [place], [place] [address]    Alcohol, Ethyl (B) 04/21/2021 <10  <10 mg/dL Final   Comment: (NOTE) Lowest detectable limit for serum alcohol is 10 mg/dL.  For medical purposes only. Performed at [facility], [place] [address], [place], [place] [address]    Salicylate Lvl 70/62/3762 <7.0 (L)  7.0 - 30.0 mg/dL Final   Performed at [place] [address], [place], [place] [address]   Acetaminophen (Tylenol), Serum 04/21/2021 <10 (L)  10 - 30 ug/mL Final   Comment: (NOTE) Therapeutic concentrations vary significantly. A range of 10-30 ug/mL  may be an effective concentration for many patients. However, some  are best treated at concentrations outside of this range. Acetaminophen concentrations >150 ug/mL at 4 hours after ingestion  and >50 ug/mL at 12 hours after ingestion are often associated with  toxic reactions.  Performed at [facility], [place] [address] [place], [place] [address]    Glucose-Capillary 04/22/2021 70  70 - 99 mg/dL  Final   Glucose reference range applies only to samples taken after fasting for at least 8 hours.  No results displayed because visit has over 200 results.    Admission on 03/04/2021, Discharged on 03/05/2021  Component Date Value Ref Range Status   SARS Coronavirus 2 by RT PCR 03/04/2021 NEGATIVE  NEGATIVE Final   Comment: (NOTE) SARS-CoV-2 target nucleic acids are NOT DETECTED.  The SARS-CoV-2 RNA is generally detectable in upper respiratory specimens during the acute phase of infection. The lowest concentration of SARS-CoV-2 viral copies this assay can detect is 138 copies/mL. A negative result does not preclude SARS-Cov-2 infection and should not be used as the sole basis for treatment or other patient management decisions. A negative result may occur with  improper specimen collection/handling, submission of specimen other than nasopharyngeal swab, presence of viral mutation(s) within the areas targeted by this assay, and inadequate number of viral copies(<138 copies/mL). A negative result must be combined with clinical observations, patient history, and epidemiological information. The expected result is Negative.  Fact Sheet for Patients:  EntrepreneurPulse.com.au  Fact Sheet for Healthcare Providers:  IncredibleEmployment.be  This test is no                          t yet approved or cleared by the [place] FDA and  has been authorized for detection and/or diagnosis of SARS-CoV-2 by FDA under an Emergency Use Authorization (EUA). This EUA will remain  in effect (meaning this test can be used) for the duration of the COVID-19 declaration under Section 564(b)(1) of the Act, 21 U.S.C.section 360bbb-3(b)(1), unless the authorization is  terminated  or revoked sooner.       Influenza A by PCR 03/04/2021 NEGATIVE  NEGATIVE Final   Influenza B by PCR 03/04/2021 NEGATIVE  NEGATIVE Final   Comment: (NOTE) The Xpert Xpress  SARS-CoV-2/FLU/RSV plus assay is intended as an aid in the diagnosis of influenza from Nasopharyngeal swab specimens and should not be used as a sole basis for treatment. Nasal washings and aspirates are unacceptable for Xpert Xpress SARS-CoV-2/FLU/RSV testing.  Fact Sheet for Patients: EntrepreneurPulse.com.au  Fact Sheet for Healthcare Providers: IncredibleEmployment.be  This test is not yet approved or cleared by the [place] FDA and has been authorized for detection and/or diagnosis of SARS-CoV-2 by FDA under an Emergency Use Authorization (EUA). This EUA will remain in effect (meaning this test can be used) for the duration of the COVID-19 declaration under Section 564(b)(1) of the Act, 21 U.S.C. section 360bbb-3(b)(1), unless the authorization is terminated or revoked.  Performed at [facility], [place] [address] [place], [place] [address]    Sodium 03/04/2021 138  135 - 145 mmol/L Final   Potassium 03/04/2021 3.8  3.5 - 5.1 mmol/L Final   Chloride 03/04/2021 107  98 - 111 mmol/L Final   CO2 03/04/2021 24  22 - 32 mmol/L Final   Glucose, Bld 03/04/2021 95  70 - 99 mg/dL Final   Glucose reference range applies only to samples taken after fasting for at least 8 hours.   BUN 03/04/2021 14  6 - 20 mg/dL Final   Creatinine, Ser 03/04/2021 1.11  0.61 - 1.24 mg/dL Final   Calcium 03/04/2021 9.1  8.9 - 10.3 mg/dL Final   Total Protein 03/04/2021 6.6  6.5 - 8.1 g/dL Final   Albumin 03/04/2021 4.1  3.5 - 5.0 g/dL Final   AST 03/04/2021 14 (L)  15 - 41 U/L Final   ALT 03/04/2021 27  0 - 44 U/L Final   Alkaline Phosphatase 03/04/2021 78  38 - 126 U/L Final   Total Bilirubin 03/04/2021 0.6  0.3 - 1.2 mg/dL Final   GFR, Estimated 03/04/2021 >60  >60 mL/min Final   Comment: (NOTE) Calculated using the CKD-EPI Creatinine Equation (2021)    Anion gap 03/04/2021 7  5 - 15 Final   Performed at [place] [address]  [place], [place] [address]   Alcohol, Ethyl (B) 03/04/2021 <10  <10 mg/dL Final   Comment: (NOTE) Lowest detectable limit for serum alcohol is 10 mg/dL.  For medical purposes only. Performed at [facility], [place] [address] [place], [place] [address]    Opiates 03/04/2021 NONE DETECTED  NONE DETECTED Final   Cocaine 03/04/2021 NONE DETECTED  NONE DETECTED Final   Benzodiazepines 03/04/2021 NONE DETECTED  NONE DETECTED Final   Amphetamines 03/04/2021 NONE DETECTED  NONE DETECTED Final   Tetrahydrocannabinol 03/04/2021 NONE DETECTED  NONE DETECTED Final   Barbiturates 03/04/2021 NONE DETECTED  NONE DETECTED Final   Comment: (NOTE) DRUG SCREEN FOR MEDICAL PURPOSES ONLY.  IF CONFIRMATION IS NEEDED FOR ANY PURPOSE, NOTIFY LAB WITHIN 5 DAYS.  LOWEST DETECTABLE LIMITS FOR URINE DRUG SCREEN Drug Class                     Cutoff (ng/mL) Amphetamine and metabolites    1000 Barbiturate and metabolites    200 Benzodiazepine                 846 Tricyclics and metabolites     300 Opiates and  metabolites        300 Cocaine and metabolites        300 THC                            50 Performed at [facility], [place] [address] [place], [place] [address]    WBC 03/04/2021 9.3  4.0 - 10.5 K/uL Final   RBC 03/04/2021 5.17  4.22 - 5.81 MIL/uL Final   Hemoglobin 03/04/2021 13.3  13.0 - 17.0 g/dL Final   HCT 03/04/2021 43.4  39.0 - 52.0 % Final   MCV 03/04/2021 83.9  80.0 - 100.0 fL Final   MCH 03/04/2021 25.7 (L)  26.0 - 34.0 pg Final   MCHC 03/04/2021 30.6  30.0 - 36.0 g/dL Final   RDW 03/04/2021 15.1  11.5 - 15.5 % Final   Platelets 03/04/2021 210  150 - 400 K/uL Final   nRBC 03/04/2021 0.0  0.0 - 0.2 % Final   Neutrophils Relative % 03/04/2021 60  % Final   Neutro Abs 03/04/2021 5.6  1.7 - 7.7 K/uL Final   Lymphocytes Relative 03/04/2021 27  % Final   Lymphs Abs 03/04/2021 2.5  0.7 - 4.0 K/uL Final   Monocytes Relative 03/04/2021 8  % Final   Monocytes Absolute 03/04/2021 0.7  0.1  - 1.0 K/uL Final   Eosinophils Relative 03/04/2021 3  % Final   Eosinophils Absolute 03/04/2021 0.3  0.0 - 0.5 K/uL Final   Basophils Relative 03/04/2021 1  % Final   Basophils Absolute 03/04/2021 0.1  0.0 - 0.1 K/uL Final   Immature Granulocytes 03/04/2021 1  % Final   Abs Immature Granulocytes 03/04/2021 0.10 (H)  0.00 - 0.07 K/uL Final   Performed at [facility], [place] [address], [place], [place] [address]    Allergies: Patient has no known allergies.  PTA Medications:  Prior to Admission medications   Medication Sig Start Date End Date Taking? Authorizing Provider  acetaminophen (TYLENOL) 500 MG tablet Take 1 tablet (500 mg total) by mouth every 6 (six) hours as needed. Patient taking differently: Take 500 mg by mouth every 6 (six) hours as needed for mild pain. 06/21/20   Domenic Moras, PA-C  amantadine (SYMMETREL) 100 MG capsule Take 100 mg by mouth 2 (two) times daily. [id number] Patient not taking: Reported on 07/17/2021 05/27/21   [provider]  amLODipine (NORVASC) 5 MG tablet Take 5 mg by mouth daily. 09/06/20   [provider]  apixaban (ELIQUIS) 5 MG TABS tablet Take 5 mg by mouth 2 (two) times daily.     [provider]  ARIPiprazole (ABILIFY) 15 MG tablet Take 1 tablet (15 mg total) by mouth daily. Patient not taking: Reported on 07/17/2021 03/29/21 05/28/21  Maida Sale, MD  ARIPiprazole (ABILIFY) 20 MG tablet Take 20 mg by mouth at bedtime as needed. 07/30/21   [provider]  Asenapine Maleate 10 MG SUBL Place 10 mg under the tongue daily. @ 1445 daily 05/27/21   [provider]  cloZAPine (CLOZARIL) 100 MG tablet Take 200 mg by mouth at bedtime. 07/30/21   [provider]  docusate sodium (COLACE) 100 MG capsule Take 400 mg by mouth at bedtime.    [provider]  haloperidol (HALDOL) 10 MG tablet Take 10 mg by mouth 2 (two) times daily. 07/30/21   [provider]  haloperidol (HALDOL) 5 MG  tablet Take 5 mg  by mouth 2 (two) times daily. 07/02/21   [provider]  hydrOXYzine (ATARAX/VISTARIL) 25 MG tablet Take 25 mg by mouth 3 (three) times daily. Patient not taking: Reported on 07/17/2021 05/27/21   [provider]  latanoprost (XALATAN) 0.005 % ophthalmic solution INSTILL 1 DROP INTO BOTH EYES EVERY NIGHT Patient taking differently: Place 1 drop into both eyes at bedtime. 11/23/20 11/23/21    loratadine (CLARITIN) 10 MG tablet Take 10 mg by mouth daily as needed for allergies.    [provider]  LORazepam (ATIVAN) 1 MG tablet Take 1 mg by mouth 2 (two) times daily. Patient not taking: Reported on 07/17/2021 05/27/21   [provider]  losartan (COZAAR) 50 MG tablet Take 50 mg by mouth 2 (two) times daily. 07/29/21   [provider]  LUMIGAN 0.01 % SOLN Place 1 drop into both eyes at bedtime. Patient not taking: Reported on 07/17/2021 04/04/21   [provider]  metFORMIN (GLUCOPHAGE-XR) 500 MG 24 hr tablet Take 500 mg by mouth 2 (two) times daily. 04/16/21   [provider]  metoprolol succinate (TOPROL-XL) 25 MG 24 hr tablet Take 1 tablet (25 mg total) by mouth daily. Patient not taking: Reported on 07/17/2021 03/29/21 07/17/21  Maida Sale, MD  Multiple Vitamin (MULTIVITAMIN WITH MINERALS) TABS tablet Take 1 tablet by mouth daily.    [provider]  nicotine (NICODERM CQ - DOSED IN MG/24 HOURS) 14 mg/24hr patch Place 1 patch (14 mg total) onto the skin daily. 03/28/21   Maida Sale, MD  OLANZapine (ZYPREXA) 10 MG tablet Take 10 mg by mouth at bedtime. 07/02/21   [provider]  topiramate (TOPAMAX) 25 MG tablet TAKE ONE TABLET BY MOUTH TWICE A DAY Patient not taking: Reported on 07/17/2021 03/19/21   Suzzanne Cloud, NP  traZODone (DESYREL) 100 MG tablet Take 200 mg by mouth at bedtime. Patient not taking: Reported on 07/17/2021 04/03/21   [provider]  gabapentin (NEURONTIN)  400 MG capsule Take 400 mg by mouth 3 (three) times daily. 12/26/20 01/14/21  [provider]  propranolol (INDERAL) 10 MG tablet Take 1 tablet (10 mg total) by mouth 2 (two) times daily. 05/03/15 01/03/20  Patrecia Pour, NP      Medical Decision Making  Admit to continuous assessment for crisis stabilization  Unable to verify PTA medications. [name] recently had surgery and his grandmother has been caring for him. Patient's grandmother was reportedly sleeping and not able to speak with TTS/Provider. [name] reported that the patient was discharged from a  facility in [place] in December, presumably [facility], but she did not recall the name.   Patient has trialed multiple medications in the past including Risperdal, invega, abilify, olanzapine, haldol, saphris, clozapine, doxepin, and trazodone.   Patient was inpatient at [facility] 03/05/2021-03/28/2021. He was discharged on abilify 15 mg daily, clozapine 150 mg QHS, clozapine 100 mg daily, lorazepam 0.5 mg daily, and topamax 25 mg BID.   Based on PTA med review, it appears that he was most recently prescribed Clozaril 200 mg Q HS, haldol 10 mg BID, and Abilify 20 mg QHS prn agitation  For now, will continue haldol 10 mg BID for schizophrenia  Continue other home medications: -amlodipine 5 mg daily for HTN -Colace 400 mg daily for constipation -losartan 50 mg daily for HTN -metformin 500 mg BID for DM -MVI daily  Agitation protocol: -zyprexa 10 mg oral every 8 hours prn -ativan 1 mg oral  prn   or -geodon 20 mg IM  x 1 dose prn    Lab Orders         Resp Panel by RT-PCR (Flu A&B, Covid) Nasopharyngeal Swab         CBC with Differential/Platelet         Comprehensive metabolic panel         Hemoglobin A1c         Ethanol         Lipid panel         TSH         POC SARS Coronavirus 2 Ag-ED - Nasal Swab         POCT Urine Drug Screen - (ICup)          Clinical Course as of 08/13/21 0445  Tue Aug 13, 2021  0129 CBC with Differential/Platelet(!) CBC unremarkable [JB]  0130 SARS Coronavirus 2 by RT PCR: NEGATIVE [JB]  0130 Hemoglobin A1C: 5.5 [JB]    Clinical Course User Index [JB] Rozetta Nunnery, NP    Recommendations  Based on my evaluation the patient does not appear to have an emergency medical condition.  Rozetta Nunnery, NP 08/13/21  4:45 AM
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
## 2021-08-13 ENCOUNTER — Other Ambulatory Visit: Payer: Self-pay

## 2021-08-13 ENCOUNTER — Encounter (HOSPITAL_COMMUNITY): Payer: Self-pay | Admitting: Registered Nurse

## 2021-08-13 DIAGNOSIS — F201 Disorganized schizophrenia: Secondary | ICD-10-CM | POA: Diagnosis not present

## 2021-08-13 DIAGNOSIS — R4689 Other symptoms and signs involving appearance and behavior: Secondary | ICD-10-CM | POA: Diagnosis present

## 2021-08-13 LAB — HEMOGLOBIN A1C
Hgb A1c MFr Bld: 5.5 % (ref 4.8–5.6)
Mean Plasma Glucose: 111.15 mg/dL

## 2021-08-13 LAB — CBC WITH DIFFERENTIAL/PLATELET
Abs Immature Granulocytes: 0.04 10*3/uL (ref 0.00–0.07)
Basophils Absolute: 0 10*3/uL (ref 0.0–0.1)
Basophils Relative: 1 %
Eosinophils Absolute: 0.2 10*3/uL (ref 0.0–0.5)
Eosinophils Relative: 2 %
HCT: 43.8 % (ref 39.0–52.0)
Hemoglobin: 13.6 g/dL (ref 13.0–17.0)
Immature Granulocytes: 1 %
Lymphocytes Relative: 24 %
Lymphs Abs: 1.6 10*3/uL (ref 0.7–4.0)
MCH: 25.5 pg — ABNORMAL LOW (ref 26.0–34.0)
MCHC: 31.1 g/dL (ref 30.0–36.0)
MCV: 82 fL (ref 80.0–100.0)
Monocytes Absolute: 0.5 10*3/uL (ref 0.1–1.0)
Monocytes Relative: 8 %
Neutro Abs: 4.3 10*3/uL (ref 1.7–7.7)
Neutrophils Relative %: 64 %
Platelets: 198 10*3/uL (ref 150–400)
RBC: 5.34 MIL/uL (ref 4.22–5.81)
RDW: 15.4 % (ref 11.5–15.5)
WBC: 6.7 10*3/uL (ref 4.0–10.5)
nRBC: 0 % (ref 0.0–0.2)

## 2021-08-13 LAB — LIPID PANEL
Cholesterol: 155 mg/dL (ref 0–200)
HDL: 41 mg/dL (ref >40)
LDL Cholesterol: 90 mg/dL (ref 0–99)
Total CHOL/HDL Ratio: 3.8 RATIO
Triglycerides: 122 mg/dL (ref <150)
VLDL: 24 mg/dL (ref 0–40)

## 2021-08-13 LAB — COMPREHENSIVE METABOLIC PANEL
ALT: 22 U/L (ref 0–44)
AST: 15 U/L (ref 15–41)
Albumin: 3.9 g/dL (ref 3.5–5.0)
Alkaline Phosphatase: 67 U/L (ref 38–126)
Anion gap: 8 (ref 5–15)
BUN: 17 mg/dL (ref 6–20)
CO2: 25 mmol/L (ref 22–32)
Calcium: 9.3 mg/dL (ref 8.9–10.3)
Chloride: 105 mmol/L (ref 98–111)
Creatinine, Ser: 1.19 mg/dL (ref 0.61–1.24)
GFR, Estimated: >60 mL/min (ref >60)
Glucose, Bld: 132 mg/dL — ABNORMAL HIGH (ref 70–99)
Potassium: 3.5 mmol/L (ref 3.5–5.1)
Sodium: 138 mmol/L (ref 135–145)
Total Bilirubin: 0.3 mg/dL (ref 0.3–1.2)
Total Protein: 6.3 g/dL — ABNORMAL LOW (ref 6.5–8.1)

## 2021-08-13 LAB — RESP PANEL BY RT-PCR (FLU A&B, COVID) ARPGX2
Influenza A by PCR: NEGATIVE
Influenza B by PCR: NEGATIVE
SARS Coronavirus 2 by RT PCR: NEGATIVE

## 2021-08-13 LAB — ETHANOL: Alcohol, Ethyl (B): <10 mg/dL (ref <10)

## 2021-08-13 LAB — TSH: TSH: 1.876 u[IU]/mL (ref 0.350–4.500)

## 2021-08-13 MED ORDER — ZIPRASIDONE MESYLATE 20 MG IM SOLR
20.0000 mg | INTRAMUSCULAR | Status: AC | PRN
  Administered 2021-08-13: 20 mg via INTRAMUSCULAR
  Filled 2021-08-13: qty 20

## 2021-08-13 MED ORDER — METFORMIN HCL ER 500 MG PO TB24
500.0000 mg | ORAL_TABLET | Freq: Two times a day (BID) | ORAL | Status: DC
  Administered 2021-08-13 – 2021-08-14 (×3): 500 mg via ORAL
  Filled 2021-08-13 (×3): qty 1

## 2021-08-13 MED ORDER — HALOPERIDOL 5 MG PO TABS
10.0000 mg | ORAL_TABLET | Freq: Two times a day (BID) | ORAL | Status: DC
  Administered 2021-08-13 – 2021-08-14 (×3): 10 mg via ORAL
  Filled 2021-08-13 (×3): qty 2

## 2021-08-13 MED ORDER — LORAZEPAM 1 MG PO TABS
1.0000 mg | ORAL_TABLET | Freq: Once | ORAL | Status: AC
  Administered 2021-08-13: 1 mg via ORAL
  Filled 2021-08-13: qty 1

## 2021-08-13 MED ORDER — OLANZAPINE 10 MG PO TBDP
10.0000 mg | ORAL_TABLET | Freq: Three times a day (TID) | ORAL | Status: DC | PRN
  Administered 2021-08-13 – 2021-08-14 (×3): 10 mg via ORAL
  Filled 2021-08-13 (×3): qty 1

## 2021-08-13 MED ORDER — ADULT MULTIVITAMIN W/MINERALS CH
1.0000 | ORAL_TABLET | Freq: Every day | ORAL | Status: DC
  Administered 2021-08-13 – 2021-08-14 (×2): 1 via ORAL
  Filled 2021-08-13 (×2): qty 1

## 2021-08-13 MED ORDER — LORATADINE 10 MG PO TABS
10.0000 mg | ORAL_TABLET | Freq: Every day | ORAL | Status: DC | PRN
  Administered 2021-08-13: 10 mg via ORAL
  Filled 2021-08-13: qty 1

## 2021-08-13 MED ORDER — LORAZEPAM 1 MG PO TABS
1.0000 mg | ORAL_TABLET | ORAL | Status: AC | PRN
  Administered 2021-08-13: 1 mg via ORAL
  Filled 2021-08-13: qty 1

## 2021-08-13 MED ORDER — DOCUSATE SODIUM 100 MG PO CAPS
400.0000 mg | ORAL_CAPSULE | Freq: Every day | ORAL | Status: DC
  Administered 2021-08-13: 400 mg via ORAL
  Filled 2021-08-13: qty 4

## 2021-08-13 MED ORDER — DIPHENHYDRAMINE HCL 25 MG PO CAPS
25.0000 mg | ORAL_CAPSULE | Freq: Once | ORAL | Status: AC
  Administered 2021-08-13: 25 mg via ORAL
  Filled 2021-08-13: qty 1

## 2021-08-13 MED ORDER — LOSARTAN POTASSIUM 50 MG PO TABS
50.0000 mg | ORAL_TABLET | Freq: Two times a day (BID) | ORAL | Status: DC
  Administered 2021-08-13 – 2021-08-14 (×3): 50 mg via ORAL
  Filled 2021-08-13 (×3): qty 1

## 2021-08-13 MED ORDER — HALOPERIDOL 5 MG PO TABS
5.0000 mg | ORAL_TABLET | Freq: Once | ORAL | Status: AC
  Administered 2021-08-13: 5 mg via ORAL
  Filled 2021-08-13: qty 1

## 2021-08-13 MED ORDER — NICOTINE 14 MG/24HR TD PT24
14.0000 mg | MEDICATED_PATCH | Freq: Every day | TRANSDERMAL | Status: DC
  Administered 2021-08-13 – 2021-08-14 (×2): 14 mg via TRANSDERMAL
  Filled 2021-08-13 (×2): qty 1

## 2021-08-13 NOTE — ED Notes (Signed)
Pt had breakfast and now he is just walking around on the unit

## 2021-08-13 NOTE — ED Notes (Signed)
Pt given Geodon for agitation, tolerated well.  Monitoring for safety.
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
## 2021-08-13 NOTE — ED Notes (Signed)
Pt sleeping@this time. Breathing even and unlabored. Will continue to monitor for safety 

## 2021-08-13 NOTE — ED Notes (Signed)
Pt changed and took a shower.  He has been given meal tray.   Awaiting SW and NP's dispo.

## 2021-08-13 NOTE — ED Notes (Signed)
Pt very agitated, banging on walls, unredirectable  to staff.  Responding to internal stimuli.  NP Assunta Found, NP Vernard Gambles at bedside to speak with pt.

## 2021-08-13 NOTE — ED Notes (Signed)
Pt had a snack  °

## 2021-08-13 NOTE — ED Notes (Signed)
Pt is currently in the bath room. Will monitor for safety.

## 2021-08-13 NOTE — ED Notes (Signed)
Pt sitting up in chair.  Awaiting mother to pick him up.

## 2021-08-13 NOTE — ED Notes (Signed)
Pt currently in bed resting.  Breathing even and unlabored.

## 2021-08-13 NOTE — Progress Notes (Signed)
[facility] LCSW Progress Note   08/13/2021    3:20 PM  MYLIN HIRANO   [id number]   Type of Contact and Topic:  Attempted Guardian Contact  CSW attempted to reach guardian in order to discuss discharge of patient. CSW was unable to reach guardian at this time. Left HIPAA compliant voicemail with contact information and callback request. Will make additional attempts to reach guardian. Situation ongoing, CSW will continue to monitor and update note as more information becomes available.     Signed:  Corky Crafts, MSW, LCSWA, LCAS 08/13/2021 3:20 PM

## 2021-08-13 NOTE — Discharge Instructions (Addendum)
Please contact one of the following facilities to start medication management and therapy services:   [facility] [address] [place]  [place], [place] [address] [phone]   [facility]  [address] [place], [place] [address] [phone]  [facility] [place]  [address] R[address] [place], [place] [address] [phone]  [facility]  [address]  [place], [place] [address] [phone]  [facility]  [address] [place], [place] [address] [phone]  [facility]  [address] Renee Rival  [place], [place] [address] [phone]  Please below the options for shelter in the surrounding area:   [facility] [phone] Option [address] [place]  [phone] Option 3   [facility] [place])  [phone]  [facility]  [phone]  [address] [place])  [phone]  [facility] [place])  [phone]  [facility]  [phone]  You may also go to the [facility] at [address], who assists with the following services:  This is a safe place to rest between the hours of 8AM and 3PM, take care of basic needs and access the services and community that make all the difference. Our guests come to the [facility] to take a class, do laundry, meet with a case manager or to get their mail. Sometimes they just need to sit in our dayroom and enjoy a conversation.  Here you will find everything from shower facilities to a computer lab, a mail room, classrooms and meeting spaces.  The [facility] helps people reconnect with their own lives and with the community at large.
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
## 2021-08-13 NOTE — ED Notes (Addendum)
Pt admitted to [facility] due to aggressive behavior and delusions. Pt A&O x4, calm and cooperative. Pt denies current SI/HI/AVH. Pt talks to self and then will stare off and begin laughing. Pt's speech is garbled and difficult to understand. Pt tolerated lab work and skin assessment well. Pt ambulated independently to unit. Oriented to unit/staff. Sandwich, chips, and juice given per pt request. No signs of acute distress noted. Will continue to monitor for safety.

## 2021-08-13 NOTE — ED Notes (Addendum)
Pt got out of bed and punched the door. Pt states he can't sleep. Pt was given cup for water and chips per his request. Nira Conn, NP notified. Pt currently resting in bed. No signs of acute distress noted. Will continue to monitor for safety.

## 2021-08-13 NOTE — ED Notes (Addendum)
Pt pacing back and forth between bathrooms. Pt came out of bathroom to nurse's station with what appears to be bowel movement all over his hands. Pt was instructed to go back to bathroom and thoroughly wash his hands, which he was compliant with. Medications administered without difficulty. Pt now resting in bed. No signs of acute distress noted. Will continue to monitor for safety.

## 2021-08-13 NOTE — ED Notes (Signed)
Pt alert.Per night shift he punched a door in the night and was labile.  After getting out of the bathroom this morning  He presented as having  a blunted affect and dull mood.  He is redirectable.  Pt given breakfast and morning medications.
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
## 2021-08-13 NOTE — ED Notes (Signed)
Pt given salad and meal.  Will continue to monitor for safety °

## 2021-08-13 NOTE — ED Notes (Signed)
Pt can be heard from nurse's station in assessment room banging on table/walls. Nursing staff and provider to assessment room. Pt states he was "making beats." Pt was asked not to bang on table/walls, which he was agreeable to. No signs of acute distress noted. Will continue to monitor for safety.

## 2021-08-14 DIAGNOSIS — F201 Disorganized schizophrenia: Secondary | ICD-10-CM | POA: Diagnosis not present

## 2021-08-14 DIAGNOSIS — R4689 Other symptoms and signs involving appearance and behavior: Secondary | ICD-10-CM

## 2021-08-14 LAB — POCT URINE DRUG SCREEN - MANUAL ENTRY (I-SCREEN)
POC Amphetamine UR: NOT DETECTED
POC Buprenorphine (BUP): NOT DETECTED
POC Cocaine UR: NOT DETECTED
POC Marijuana UR: NOT DETECTED
POC Methadone UR: NOT DETECTED
POC Methamphetamine UR: NOT DETECTED
POC Morphine: NOT DETECTED
POC Oxazepam (BZO): POSITIVE — AB
POC Oxycodone UR: NOT DETECTED
POC Secobarbital (BAR): NOT DETECTED

## 2021-08-14 MED ORDER — ZIPRASIDONE MESYLATE 20 MG IM SOLR: 20.0000 mg | Freq: Once | INTRAMUSCULAR | Status: DC | PRN

## 2021-08-14 NOTE — ED Notes (Signed)
Pt had a snack  °

## 2021-08-14 NOTE — ED Notes (Signed)
Did not wake up patient for vitals due to patient have to have injections for behavior. Will notify oncoming shift

## 2021-08-14 NOTE — ED Notes (Signed)
Patient is having loud outburst. Patient talking to himself while lying on bed. Patient making loud gurbbling sounds. Patient is not easily redirected. Patient is safe on unit. Patient continues to be monitored by staff.

## 2021-08-14 NOTE — Progress Notes (Addendum)
[facility] LCSW Progress Note   08/14/2021    10:26 AM  Brandon Bodo   MD:[id number]   Type of Contact and Topic:  Constituent Contact.     CSW contacted Brandon [name] with [facility] related to AFL placement. States he has referred patient to [facility], however, he does not have a timeline for when he will be accepted to the facility, nor does he have a timeline for arrival.   Psychiatric provider notified. Situation ongoing, CSW will continue to monitor and update note as more information becomes available.      Signed:  Durenda Hurt, MSW, LCSWA, LCAS 08/14/2021 10:26 AM   ADDENDUM  Patient was denied by [facility] due to aggression. Corene Cornea, [facility], states he has no alternative placement options at this time, though continues to search for group homes.   Signed:  Durenda Hurt, MSW, LCSWA, LCAS 08/14/2021 4:18 PM

## 2021-08-14 NOTE — ED Notes (Signed)
Patient continues to have out burst aeb talking out loud, shouting and gurbbling. Patient continues to demand needs aeb banging on glass at nursing station. Patient pacing floor hitting walls. Patient safe on unit. Patient continually monitored on unit.

## 2021-08-14 NOTE — ED Notes (Signed)
Patient is groaning and and having verbal outburst on unit aeb MHT observation.

## 2021-08-14 NOTE — ED Notes (Signed)
Pt discharged with  AVS.  AVS reviewed prior to discharge.  Pt alert, oriented, and ambulatory. Bus pass provided for transportation per provider's request.  Pt verbalized he knows how to get home on the bus system.  Provider notified guardian of discharge.  Safety maintained.

## 2021-08-14 NOTE — ED Notes (Signed)
Patient is lying in bed. Patient is having outburst aeb groans and laughter. Patient is responding to internal stimuli. Patient talks out loud to himself. Patient is not easily redirected. Patient is safe on unit. Patient is continually being monitored on unit.

## 2021-08-14 NOTE — ED Notes (Signed)
Pt awake and pacing unit.  Pt then witnessed messing with the ice/ water machine.  Pt was redirected back to his bed area and provided breakfast.  Pt denies SI, HI, and AVH at this time.  This Clinical research associate did witness pt speaking to self and laughing out loud.  No one was currently engaging with the pt at that time. Pt reports he is ready to go home.  Will continue to monitor for safety.

## 2021-08-14 NOTE — ED Notes (Signed)
Pt had breakfast. 
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
## 2021-08-26 ENCOUNTER — Emergency Department (HOSPITAL_COMMUNITY): Payer: Medicare Other

## 2021-08-26 ENCOUNTER — Other Ambulatory Visit: Payer: Self-pay

## 2021-08-26 ENCOUNTER — Encounter (HOSPITAL_COMMUNITY): Payer: Self-pay

## 2021-08-26 ENCOUNTER — Emergency Department (HOSPITAL_COMMUNITY)
Admission: EM | Admit: 2021-08-26 | Discharge: 2021-08-27 | Disposition: A | Discharge status: Home / Self Care | Payer: Medicare Other | Attending: Emergency Medicine | Admitting: Emergency Medicine

## 2021-08-26 ENCOUNTER — Other Ambulatory Visit: Payer: Self-pay | Admitting: Neurology

## 2021-08-26 DIAGNOSIS — R4182 Altered mental status, unspecified: Secondary | ICD-10-CM | POA: Diagnosis present

## 2021-08-26 DIAGNOSIS — R7989 Other specified abnormal findings of blood chemistry: Secondary | ICD-10-CM | POA: Insufficient documentation

## 2021-08-26 DIAGNOSIS — R4689 Other symptoms and signs involving appearance and behavior: Secondary | ICD-10-CM

## 2021-08-26 DIAGNOSIS — F23 Brief psychotic disorder: Secondary | ICD-10-CM | POA: Diagnosis not present

## 2021-08-26 DIAGNOSIS — Z20822 Contact with and (suspected) exposure to covid-19: Secondary | ICD-10-CM | POA: Insufficient documentation

## 2021-08-26 DIAGNOSIS — I10 Essential (primary) hypertension: Secondary | ICD-10-CM | POA: Diagnosis not present

## 2021-08-26 DIAGNOSIS — Z7984 Long term (current) use of oral hypoglycemic drugs: Secondary | ICD-10-CM | POA: Diagnosis not present

## 2021-08-26 DIAGNOSIS — Z7901 Long term (current) use of anticoagulants: Secondary | ICD-10-CM | POA: Diagnosis not present

## 2021-08-26 DIAGNOSIS — R7309 Other abnormal glucose: Secondary | ICD-10-CM | POA: Diagnosis not present

## 2021-08-26 DIAGNOSIS — Z79899 Other long term (current) drug therapy: Secondary | ICD-10-CM | POA: Diagnosis not present

## 2021-08-26 LAB — CBC
HCT: 44.9 % (ref 39.0–52.0)
Hemoglobin: 13.7 g/dL (ref 13.0–17.0)
MCH: 25.5 pg — ABNORMAL LOW (ref 26.0–34.0)
MCHC: 30.5 g/dL (ref 30.0–36.0)
MCV: 83.6 fL (ref 80.0–100.0)
Platelets: 227 10*3/uL (ref 150–400)
RBC: 5.37 MIL/uL (ref 4.22–5.81)
RDW: 15.1 % (ref 11.5–15.5)
WBC: 6.3 10*3/uL (ref 4.0–10.5)
nRBC: 0 % (ref 0.0–0.2)

## 2021-08-26 LAB — COMPREHENSIVE METABOLIC PANEL
ALT: 18 U/L (ref 0–44)
AST: 15 U/L (ref 15–41)
Albumin: 3.9 g/dL (ref 3.5–5.0)
Alkaline Phosphatase: 66 U/L (ref 38–126)
Anion gap: 7 (ref 5–15)
BUN: 14 mg/dL (ref 6–20)
CO2: 23 mmol/L (ref 22–32)
Calcium: 9.3 mg/dL (ref 8.9–10.3)
Chloride: 108 mmol/L (ref 98–111)
Creatinine, Ser: 1.36 mg/dL — ABNORMAL HIGH (ref 0.61–1.24)
GFR, Estimated: >60 mL/min (ref >60)
Glucose, Bld: 92 mg/dL (ref 70–99)
Potassium: 3.9 mmol/L (ref 3.5–5.1)
Sodium: 138 mmol/L (ref 135–145)
Total Bilirubin: 0.4 mg/dL (ref 0.3–1.2)
Total Protein: 6.1 g/dL — ABNORMAL LOW (ref 6.5–8.1)

## 2021-08-26 LAB — ETHANOL: Alcohol, Ethyl (B): <10 mg/dL (ref <10)

## 2021-08-26 LAB — RAPID URINE DRUG SCREEN, HOSP PERFORMED
Amphetamines: NOT DETECTED
Barbiturates: NOT DETECTED
Benzodiazepines: POSITIVE — AB
Cocaine: NOT DETECTED
Opiates: NOT DETECTED
Tetrahydrocannabinol: NOT DETECTED

## 2021-08-26 LAB — RESP PANEL BY RT-PCR (FLU A&B, COVID) ARPGX2
Influenza A by PCR: NEGATIVE
Influenza B by PCR: NEGATIVE
SARS Coronavirus 2 by RT PCR: NEGATIVE

## 2021-08-26 LAB — CBG MONITORING, ED: Glucose-Capillary: 85 mg/dL (ref 70–99)

## 2021-08-26 MED ORDER — LORAZEPAM 1 MG PO TABS
1.0000 mg | ORAL_TABLET | Freq: Two times a day (BID) | ORAL | Status: DC
  Administered 2021-08-26 – 2021-08-27 (×3): 1 mg via ORAL
  Filled 2021-08-26 (×4): qty 1

## 2021-08-26 MED ORDER — ARIPIPRAZOLE 10 MG PO TABS
20.0000 mg | ORAL_TABLET | Freq: Every day | ORAL | Status: DC
  Administered 2021-08-26 – 2021-08-27 (×2): 20 mg via ORAL
  Filled 2021-08-26 (×2): qty 2

## 2021-08-26 MED ORDER — HYDROXYZINE HCL 25 MG PO TABS
25.0000 mg | ORAL_TABLET | Freq: Three times a day (TID) | ORAL | Status: DC
  Administered 2021-08-26 – 2021-08-27 (×4): 25 mg via ORAL
  Filled 2021-08-26 (×5): qty 1

## 2021-08-26 MED ORDER — HALOPERIDOL LACTATE 5 MG/ML IJ SOLN
10.0000 mg | INTRAMUSCULAR | Status: AC
  Filled 2021-08-26: qty 2

## 2021-08-26 MED ORDER — HALOPERIDOL 5 MG PO TABS
10.0000 mg | ORAL_TABLET | ORAL | Status: AC
  Administered 2021-08-26: 10 mg via ORAL
  Filled 2021-08-26: qty 2

## 2021-08-26 MED ORDER — ADULT MULTIVITAMIN W/MINERALS CH
1.0000 | ORAL_TABLET | Freq: Every day | ORAL | Status: DC
  Administered 2021-08-26 – 2021-08-27 (×2): 1 via ORAL
  Filled 2021-08-26 (×2): qty 1

## 2021-08-26 MED ORDER — DOCUSATE SODIUM 100 MG PO CAPS
400.0000 mg | ORAL_CAPSULE | Freq: Every day | ORAL | Status: DC
  Administered 2021-08-26: 400 mg via ORAL
  Filled 2021-08-26 (×2): qty 4

## 2021-08-26 MED ORDER — ASENAPINE MALEATE 10 MG SL SUBL
10.0000 mg | SUBLINGUAL_TABLET | Freq: Every day | SUBLINGUAL | Status: DC
Start: 2021-08-26 — End: 2021-08-26

## 2021-08-26 MED ORDER — CLOZAPINE 100 MG PO TABS
200.0000 mg | ORAL_TABLET | Freq: Every day | ORAL | Status: DC
  Administered 2021-08-26: 200 mg via ORAL
  Filled 2021-08-26 (×2): qty 2

## 2021-08-26 MED ORDER — ASENAPINE MALEATE 5 MG SL SUBL
10.0000 mg | SUBLINGUAL_TABLET | Freq: Every day | SUBLINGUAL | Status: DC
  Administered 2021-08-27: 10 mg via SUBLINGUAL
  Filled 2021-08-26: qty 2

## 2021-08-26 MED ORDER — OLANZAPINE 10 MG PO TABS
10.0000 mg | ORAL_TABLET | Freq: Every day | ORAL | Status: DC
  Administered 2021-08-26: 10 mg via ORAL
  Filled 2021-08-26 (×3): qty 1

## 2021-08-26 MED ORDER — HALOPERIDOL 5 MG PO TABS
10.0000 mg | ORAL_TABLET | Freq: Two times a day (BID) | ORAL | Status: DC
  Administered 2021-08-26 – 2021-08-27 (×2): 10 mg via ORAL
  Filled 2021-08-26 (×3): qty 2

## 2021-08-26 MED ORDER — AMLODIPINE BESYLATE 5 MG PO TABS
5.0000 mg | ORAL_TABLET | Freq: Every day | ORAL | Status: DC
  Administered 2021-08-26 – 2021-08-27 (×2): 5 mg via ORAL
  Filled 2021-08-26 (×2): qty 1

## 2021-08-26 MED ORDER — DOCUSATE SODIUM 100 MG PO CAPS: 400.0000 mg | ORAL_CAPSULE | Freq: Every day | ORAL | Status: DC

## 2021-08-26 MED ORDER — METFORMIN HCL ER 500 MG PO TB24
500.0000 mg | ORAL_TABLET | Freq: Two times a day (BID) | ORAL | Status: DC
  Administered 2021-08-26 – 2021-08-27 (×3): 500 mg via ORAL
  Filled 2021-08-26 (×3): qty 1

## 2021-08-26 MED ORDER — AMANTADINE HCL 100 MG PO CAPS
100.0000 mg | ORAL_CAPSULE | Freq: Two times a day (BID) | ORAL | Status: DC
  Administered 2021-08-26 – 2021-08-27 (×2): 100 mg via ORAL
  Filled 2021-08-26 (×3): qty 1

## 2021-08-26 MED ORDER — APIXABAN 5 MG PO TABS
5.0000 mg | ORAL_TABLET | Freq: Two times a day (BID) | ORAL | Status: DC
  Administered 2021-08-26 – 2021-08-27 (×2): 5 mg via ORAL
  Filled 2021-08-26 (×3): qty 1

## 2021-08-26 NOTE — ED Notes (Signed)
Pt provided sandwich bag with apple juice.
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
## 2021-08-26 NOTE — ED Notes (Signed)
Pt provided dinner tray.

## 2021-08-26 NOTE — ED Notes (Signed)
Pt provided snacks

## 2021-08-26 NOTE — ED Notes (Signed)
Pt calm and cooperative at this time.

## 2021-08-26 NOTE — ED Notes (Signed)
Pt encouraged to use urinal at bedside. Pt compliant at this time. Pt provided crackers, PB and drink.

## 2021-08-26 NOTE — ED Notes (Signed)
Pt laughing to himself in bed. No TV on or other devices.
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
## 2021-08-27 NOTE — BH Assessment (Addendum)
Comprehensive Clinical Assessment (CCA) Note  08/27/2021 Brandon GRIBBINS MD:[id number]  Disposition: TTS completed. Discussed clinicals with the [facility] provider Brandon Blossom, NP) and patient is recommended for psych clearance. IVC to rescinded, he does not meet criteria for inpatient psychiatric treatment.    [name] is documented as the Legal guardian, Brandon [name], Brandon [name], [phone]. Clinician contacted patient's mother to confirm. Also, chart review and prior notes all indicate that she is guardian. Clinician discussed with mother/guardian patient's disposition. Provided disposition updates and recommendations.   Patient to follow-up with current psychiatrist (Dr. Darleene [name]) @ [facility] and current Neurologist. In addition, to resources for the [facility], with information regarding crises walk-in services and outpatient walk-in services.   Clinician further encouraged patient's mother/guardian to follow up with patient's current Care Coordinator, Brandon [name] ([place] and Office [phone]) with Brandon [name] who is pursuing ALF placement, to request follow up information regarding this current status.    After the disposition and recommendations were discussed with patient's mother, she asked if patient could stay in the Emergency Department another night. Clinician explained that he is psych cleared to discharge home and it's no further reason to hold patient in the emergency department at this time.    His mother further stated that she is a paraplegic and couldn't come get him tonight. Clinician asked mom if she had any support systems (family, friends, etc.) that would assist her in picking patient up from the Emergency Department on her behalf.    His mother then accused this Clinician of purposely calling her late when his transport could have been arranged this morning. She further stated that all her supports are "Getting ready for work tomorrow" and couldn't  pick him up for that reason.    The conversation proceeded by her yelling that she had 4 back surgery's recently.  His mother is requesting that patient is placed in an [place] and sent home "because otherwise he is going to have to stay with you guys until tomorrow".   Clinician informed mom that transportation needs would be discussed with the Emergency Department as this Clinician is unsure what options are available.     Patient's nurse Brandon Hailstone, RN) and EDP Brandon Edouard, RN), provided disposition updates.   [place] ED from 08/26/2021 in [place] ED from 08/12/2021 in [facility] ED from 07/16/2021 in [place] No Risk No Risk No Risk       The patient demonstrates the following risk factors for suicide: Chronic risk factors for suicide include: Schizophrenia, chronic with acute exacerbation and Medical Dx's of MS . Acute risk factors for suicide include: family or marital conflict. Protective factors for this patient include: positive therapeutic relationship. Considering these factors, the overall suicide risk at this point appears to be "No Risk". Patient is not appropriate for outpatient follow up.   Chief Complaint:  Chief Complaint  Patient presents with   IVC   Psychiatric Evaluation   Visit Diagnosis: Schizophrenia, chronic with acute exacerbation    Brandon [name] is a 31 y.o. male with history of behavioral health problems of disorganized schizophrenia, hypertension, intellectual disability, who presents today from court house via EMS.  Patient reportedly had court today.  They reported was lethargic during court and sent him here to be evaluated.  He was IVC prior to being brought to the ED.  Here he states that he has been taking his medications.  He  states that he felt very sleepy court.  He denies any aggressive behavior.  He was  seen face to face by this Clinician. Upon chart review patient admitted to [facility]: 03/25/2021, 03/20/2021, 03/15/2021, 03/11/2021, 03/06/2021, 03/05/2021.  Also, had multiple Urgent Care visits  all have similar complaints.  As Clinician asked patient questions, his responses were intermittently incomprehensible. He he lives with his documented Legal guardian. Mother, [phone]. Patient however says that he doesn't have a guardian. Patient denies current suicidal ideations. Also, denies making any statements and/or gestures to warrant concerns for his safety. Denies current stressors. However, acknowledges feeling stressed in the past due to thinking about my past relationships. Denies a family hx of mental health illnesses. Denies access to means such as firearms. Denies a hx of trauma and/or abuse. Appetite and sleep are poor Patient denies homicidal ideations. Denies hx of aggressive and/or assaultive behaviors. No plan and/or intent. No legal issues. Denies AVH's. Denies alcohol and/or drug use. During evaluation Brandon [name] is sitting up in the hospital bed. He is alert/oriented person and place. Currently he is calm and cooperative; He does not appears to be paranoid with blunt affect. He makes minimal eye contact. His thought process is relevant.    Collateral information: Legal guardian. Mother, [phone], Brandon [name] Clinician contacted patients mother/guardian. She was only able to report to this Clinician 2nd hand information of what was reported to her regarding the circumstances leading to this ER admission. States that he was in court yesterday, for a previous assault charge, related to an assault toward his grandmother. [name]/guardian did not go court, however; sent patient's grandmother on her behalf. She received a report back from patient's grandmother that he impulsively started hitting the walls and yelling in the court room. Says that the court staff also felt that he was  hallucinating  felt that he was hallucinating, "exhibiting behaviors of someone that has mental health issues. The grandmother was in court and witnessed patients' behaviors.  Therefore, patient's grandmother was asked by court staff to initiate the IVC papers. States that the court staff told the grandmother what to put on the report. His mother feels that patient was triggered in court today, after being told that he could serve time for assaulting his grandmother. Reportedly patient should not have been in a normal court room and should have been in a specialized court room, for those with mental health diagnosis. According to the mother patient's lawyer was also not present to report this information to the judge. Therefore, may have been triggered by the set up of the court proceedings.According to his mom, patient was hallucinating before he left to attend his court hearing. She says that he has hallucinations all the time. Also, intermittent episodes of violence, since the age of 61 yrs old. Mom says that his violence started with knocking over televisions and punching walls. At the age of 31 yrs old he moved out because he was doing well on medications. However, after moving out he immediately stopped taking his medications, then moved back home with mother, who has every since assumed care/guardianship of patient. Currently patient's chronic behavior are managed by a Neurologist and [facility] psychiatrist to treat his diagnosis of MS and Schizophrenia. He is also has an assigned, Care Coordinator with [facility], who is in search of a  level IV group home and/or AFL.  CCA Screening, Triage and Referral (STR)  Patient Reported Information How did you hear about [place]? Legal System  What Is the  Reason for Your Visit/Call Today? Brandon [name] is a 31 y.o. male with history of behavioral health problems of disorganized schizophrenia, hypertension, intellectual disability, who presents today from court house  via EMS.  Patient reportedly had court today.  They reported was lethargic during court and sent him here to be evaluated.  He was IVC prior to being brought to the ED.  Here he states that he has been taking his medications.  He states that he felt very sleepy court.  He denies any aggressive behavior.  How Long Has This Been Causing You Problems? 1 wk - 1 month  What Do You Feel Would Help You the Most Today? Treatment for Depression or other mood problem; Medication(s)   Have You Recently Had Any Thoughts About Hurting Yourself? No  Are You Planning to Commit Suicide/Harm Yourself At This time? No   Have you Recently Had Thoughts About [place]? No  Are You Planning to Harm Someone at This Time? No  Explanation: No data recorded  Have You Used Any Alcohol or Drugs in the Past 24 Hours? No  How Long Ago Did You Use Drugs or Alcohol? No data recorded What Did You Use and How Much? No data recorded  Do You Currently Have a Therapist/Psychiatrist? Yes  Name of Therapist/Psychiatrist: Dr. Darleene [name]   Have You Been Recently Discharged From Any Office Practice or Programs? -- (UTA)  Explanation of Discharge From Practice/Program: No data recorded    CCA Screening Triage Referral Assessment Type of Contact: Face-to-Face  Telemedicine Service Delivery:   Is this Initial or Reassessment? Initial Assessment  Date Telepsych consult ordered in [facility]:  05/28/21  Time Telepsych consult ordered in [facility]:  1326  Location of Assessment: [facility] Assessment Services  Provider Location: [facility] Assessment Services   Collateral Involvement: Aaliyah Scharpf, mother/legal guardian: [phone]   Does Patient Have a Court Appointed Legal Guardian? No data recorded Name and Contact of Legal Guardian: No data recorded If Minor and Not Living with Parent(s), Who has Custody? n/a  Is CPS involved or ever been involved? Never  Is APS involved or ever been involved?  Never   Patient Determined To Be At Risk for Harm To Self or Others Based on Review of Patient Reported Information or Presenting Complaint? No  Method: No data recorded Availability of Means: No data recorded Intent: No data recorded Notification Required: No data recorded Additional Information for Danger to Others Potential: No data recorded Additional Comments for Danger to Others Potential: No data recorded Are There Guns or Other Weapons in Your Home? No data recorded Types of Guns/Weapons: No data recorded Are These Weapons Safely Secured?                            No data recorded Who Could Verify You Are Able To Have These Secured: No data recorded Do You Have any Outstanding Charges, Pending Court Dates, Parole/Probation? No data recorded Contacted To Inform of Risk of Harm To Self or Others: -- (N/A)    Does Patient Present under Involuntary Commitment? No  IVC Papers Initial File Date: 05/28/21   [place] of Residence: [place]   Patient Currently Receiving the Following Services: Medication Management   Determination of Need: Urgent (48 hours)   Options For Referral: Medication Management; Outpatient Therapy     CCA Biopsychosocial Patient Reported Schizophrenia/Schizoaffective Diagnosis in Past: Yes   Strengths: Pt is, overall, cooperative. He is appropriately  dressed. He has consistent housing.   Mental Health Symptoms Depression:   Irritability; Change in energy/activity   Duration of Depressive symptoms:  Duration of Depressive Symptoms: N/A   Mania:   Racing thoughts; Irritability   Anxiety:    Difficulty concentrating   Psychosis:   Grossly disorganized or catatonic behavior; Hallucinations   Duration of Psychotic symptoms:  Duration of Psychotic Symptoms: Greater than six months   Trauma:   N/A   Obsessions:   N/A   Compulsions:   N/A   Inattention:   N/A   Hyperactivity/Impulsivity:   N/A   Oppositional/Defiant  Behaviors:   Aggression towards people/animals; Temper; Intentionally annoying   Emotional Irregularity:   Mood lability; Potentially harmful impulsivity   Other Mood/Personality Symptoms:   None noted    Mental Status Exam Appearance and self-care  Stature:   Tall   Weight:   Overweight   Clothing:   Casual; Age-appropriate   Grooming:   Normal   Cosmetic use:   None   Posture/gait:   Normal   Motor activity:   Slowed   Sensorium  Attention:   Distractible; Confused   Concentration:   Scattered   Orientation:   Situation; Place; Person; Object   Recall/memory:   -- (UTA)   Affect and Mood  Affect:   Anxious   Mood:   Anxious; Irritable   Relating  Eye contact:   Normal   Facial expression:   Constricted   Attitude toward examiner:   Cooperative; Hostile   Thought and Language  Speech flow:  Garbled; Paucity; Slurred; Articulation error; Pressured   Thought content:   Appropriate to Mood and Circumstances   Preoccupation:   None   Hallucinations:   Auditory; Visual   Organization:  No data recorded  [employer] of Knowledge:   Poor   Intelligence:   Needs investigation   Abstraction:   Abstract   Judgement:   Impaired   Reality Testing:   Adequate   Insight:   Lacking   Decision Making:   Impulsive   Social Functioning  Social Maturity:   Impulsive   Social Judgement:   Heedless; Naive   Stress  Stressors:   Family conflict; Illness   Coping Ability:   Overwhelmed   Skill Deficits:   Activities of daily living; Decision making; Self-control; Responsibility   Supports:   Family; Friends/Service system     Religion: Religion/Spirituality Are You A Religious Person?: No How Might This Affect Treatment?: Not assessed  Leisure/Recreation: Leisure / Recreation Do You Have Hobbies?: Yes Leisure and Hobbies: Video games  Exercise/Diet: Exercise/Diet Do You Exercise?: No Have You  Gained or Lost A Significant Amount of Weight in the Past Six Months?: No Do You Follow a Special Diet?: No Do You Have Any Trouble Sleeping?: Yes Explanation of Sleeping Difficulties: Pt states he has difficulties falling and staying asleep   CCA Employment/Education Employment/Work Situation: Employment / Work Situation Employment Situation: On disability Why is Patient on Disability: MS and MH How Long has Patient Been on Disability: Not assessed. Patient's Job has Been Impacted by Current Illness: No Has Patient ever Been in the [employer]?: No  Education: Education Is Patient Currently Attending School?: No Did You Nutritional therapist?: No Did You Have An Individualized Education Program (IIEP): No Did You Have Any Difficulty At School?: No Patient's Education Has Been Impacted by Current Illness: No   CCA Family/Childhood History Family and Relationship History: Family history Marital status: Single Does  patient have children?: No  Childhood History:  Childhood History By whom was/is the patient raised?: Mother Did patient suffer any verbal/emotional/physical/sexual abuse as a child?: No Did patient suffer from severe childhood neglect?: No Has patient ever been sexually abused/assaulted/raped as an adolescent or adult?: No Was the patient ever a victim of a crime or a disaster?: No Witnessed domestic violence?: No Has patient been affected by domestic violence as an adult?: No  Child/Adolescent Assessment:     CCA Substance Use Alcohol/Drug Use: Alcohol / Drug Use Pain Medications: See MAR Prescriptions: See MAR Over the Counter: See MAR History of alcohol / drug use?: No history of alcohol / drug abuse Longest period of sobriety (when/how long): N/A                         ASAM's:  Six Dimensions of Multidimensional Assessment  Dimension 1:  Acute Intoxication and/or Withdrawal Potential:      Dimension 2:  Biomedical Conditions and Complications:       Dimension 3:  Emotional, Behavioral, or Cognitive Conditions and Complications:     Dimension 4:  Readiness to Change:     Dimension 5:  Relapse, Continued use, or Continued Problem Potential:     Dimension 6:  Recovery/Living Environment:     ASAM Severity Score:    ASAM Recommended Level of Treatment:     Substance use Disorder (SUD)    Recommendations for Services/Supports/Treatments: Recommendations for Services/Supports/Treatments Recommendations For Services/Supports/Treatments: Individual Therapy, Medication Management, ACCTT (Assertive Community Treatment)  Discharge Disposition:    DSM5 Diagnoses: Patient Active Problem List   Diagnosis Date Noted   Aggressive behavior of adult 08/13/2021   Acute alcohol intoxication ([place]) 07/17/2021   Agitation 04/22/2021   Schizophrenia, chronic with acute exacerbation ([place]) 03/13/2021   Schizophrenia, 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[place]) 03/05/2021   Falls 09/13/2020   Unspecified intellectual disabilities 01/27/2020   Migraine headache 04/18/2019   Generalized weakness    History of pulmonary embolism 08/12/2017   Multiple sclerosis exacerbation ([place]) 07/30/2017   Left-sided weakness 01/18/2014   Multiple sclerosis ([place]) 05/20/2013   Unsteady gait 11/23/2012   HTN (hypertension) 07/04/2011   Ataxia 07/02/2011   Obesity, Class III, BMI 40-49.9 (morbid obesity) ([place]) 01/14/2010   Attention deficit hyperactivity disorder (ADHD) 01/14/2010     Referrals to Alternative Service(s): Referred to Alternative Service(s):   Place:   Date:   Time:    Referred to Alternative Service(s):   Place:   Date:   Time:    Referred to Alternative Service(s):   Place:   Date:   Time:    Referred to Alternative Service(s):   Place:   Date:   Time:     Waldon Merl, Counselor

## 2021-08-27 NOTE — ED Notes (Signed)
Patient on TTS 

## 2021-08-27 NOTE — BH Assessment (Signed)
@  1807, followed up with patient's nurse Oneta Rack, RN) to see if patient is available to complete his initial TTS assessment.

## 2021-08-27 NOTE — ED Notes (Signed)
Patient ambulatory to restroom without assistance. 

## 2021-08-27 NOTE — ED Provider Notes (Signed)
I was informed that psychiatry has cleared the patient for discharge and arranged outpatient follow-up.  I rescinded his IVC.   Terrilee Files, MD 08/28/21 1249

## 2021-08-27 NOTE — ED Notes (Addendum)
Patient has been here 30 hours and has not had an assessment by psychiatry. Sent a secure chat this am at [phone] with no response.

## 2021-08-27 NOTE — ED Notes (Signed)
Patient will laugh out loud or yell intermittently

## 2021-08-27 NOTE — BH Assessment (Addendum)
TTS completed. Discussed clinicals with the [facility] provider [name], NP) and patient is recommended for psych clearance. IVC to rescinded, he does not meet criteria for inpatient psychiatric treatment.   [name] is documented as the Legal guardian, Brandon [name], Brandon [name], [phone]. Clinician contacted patient's mother to confirm. Also, chart review and prior notes all indicate that she is guardian. Clinician discussed with mother/guardian patient's disposition. Provided disposition updates and recommendations.  Patient to follow-up with current psychiatrist (Dr. [name]) @ [facility] and current Neurologist. In addition, to resources for the [facility], with information regarding crises walk-in services and outpatient walk-in services.  Clinician further encouraged patient's mother/guardian to follow up with patient's current Care Coordinator, [name] ([place] and Office [phone]) with [name] who is pursuing ALF placement, to request follow up information regarding this current status.   After the disposition and recommendations were discussed with patient's mother, she asked if patient could stay in the Emergency Department another night. Clinician explained that he is psych cleared to discharge home and it's no further reason to hold patient in the emergency department at this time.   His mother further stated that she is a paraplegic and couldn't come get him tonight. Clinician asked mom if she had any support systems (family, friends, etc.) that would assist her in picking patient up from the Emergency Department on her behalf.   His mother then accused this Clinician of purposely calling her late when his transport could have been arranged this morning. She further stated that all her supports are "Getting ready for work tomorrow" and couldn't pick him up for that reason.   The conversation proceeded by her yelling that she had 4 back surgery's  recently.  His mother is requesting that patient is placed in an [place] and sent home "because otherwise he is going to have to stay with you guys until tomorrow".  Clinician informed mom that transportation needs would be discussed with the Emergency Department as this Clinician is unsure what options are available.    Patient's nurse [name], RN) and EDP [name], RN), provided disposition updates.

## 2021-09-25 ENCOUNTER — Other Ambulatory Visit: Payer: Self-pay | Admitting: Neurology

## 2021-09-28 ENCOUNTER — Emergency Department: Payer: Medicare Other

## 2021-09-28 ENCOUNTER — Emergency Department
Admission: EM | Admit: 2021-09-28 | Discharge: 2021-09-28 | Disposition: A | Discharge status: Home / Self Care | Payer: Medicare Other | Attending: Emergency Medicine | Admitting: Emergency Medicine

## 2021-09-28 ENCOUNTER — Other Ambulatory Visit: Payer: Self-pay

## 2021-09-28 DIAGNOSIS — I10 Essential (primary) hypertension: Secondary | ICD-10-CM | POA: Insufficient documentation

## 2021-09-28 DIAGNOSIS — M79605 Pain in left leg: Secondary | ICD-10-CM | POA: Insufficient documentation

## 2021-09-28 DIAGNOSIS — Y9302 Activity, running: Secondary | ICD-10-CM | POA: Diagnosis not present

## 2021-09-28 DIAGNOSIS — X58XXXA Exposure to other specified factors, initial encounter: Secondary | ICD-10-CM | POA: Diagnosis not present

## 2021-09-28 MED ORDER — ACETAMINOPHEN 500 MG PO TABS
1000.0000 mg | ORAL_TABLET | Freq: Once | ORAL | Status: AC
  Administered 2021-09-28: 1000 mg via ORAL
  Filled 2021-09-28: qty 2

## 2021-09-28 MED ORDER — IBUPROFEN 800 MG PO TABS: 800.0000 mg | ORAL_TABLET | Freq: Three times a day (TID) | ORAL | 0 refills | Status: DC | PRN

## 2021-09-28 NOTE — ED Notes (Signed)
Pt screaming and waving arms and stating "I'm gonna kill them!". Security at bedside. Pt also was kicking walls a few minutes ago.  ?

## 2021-09-28 NOTE — ED Notes (Signed)
Pt is yelling at ED tech at this time and cursing and making fighting gestures with arms and jumping up and down. ?

## 2021-09-28 NOTE — ED Notes (Signed)
CN at bedside talking to pt. CN called group home and will call taxi. ?

## 2021-09-28 NOTE — ED Notes (Signed)
Patient has made several attempts to leave his room and has gotten down the hallway. Patient is getting impatient waiting for EMS to take him home. [facility] manager Berna Spare said he couldn't leave the group home and didn't have a car. Berna Spare stated that [facility] PD informed the patient's mother that they or EMS will bring the patient back to the group home. Patient was given an ED sandwich tray, crackers and peanut butter and a soda. Vernona Rieger ED tech is outside the room to redirect the patient back into his room.  ?

## 2021-09-28 NOTE — ED Notes (Signed)
Pt found wandering in hall. Redirected to room and TV turned out. ?

## 2021-09-28 NOTE — ED Notes (Signed)
Given a soda to drink, patient is sitting of foot of bed laughing at a movie on TV.   Staying in the room was reinforced and patient was compliant.   Updated that someone from the care home would be here soon to pick up.   ? ?Spoke with care home, representative stated some should be here soon. ?

## 2021-09-28 NOTE — ED Notes (Signed)
Pt redirected back to room as he was standing in hall trying to look into pt room.  ?Pt screaming, kicking walls, yelling "call the police, I will kill them, I want to go to jail, call them!" ?One of the providers tried calming pt down and pt continued yelling in his face.  ?Security was called and they have helped in calming pt down. Pt still standing in doorway of room pacing.  ?Staff waiting on Brandon [name] from Group Home to arrive for pick up  ?

## 2021-09-28 NOTE — ED Provider Notes (Signed)
? ?[facility] ?Provider Note ? ? ? Event Date/Time  ? First MD Initiated Contact with Patient 09/28/21 1500   ?  (approximate) ? ? ?History  ? ?Chief Complaint ?Leg Pain ? ? ?HPI ?Brandon [name] is a 31 y.o. male, history of hypertension, multiple sclerosis, obesity, ADHD, schizophrenia, pulmonary embolism, bipolar 1, presents to the emergency department for evaluation of leg pain.  Patient states that he was running approximately 6 days ago when he felt a sudden onset of pain in his left leg.  He states that the pain is in his lower left leg, midshaft region.  He states that he is still able to walk appropriately on it.  Denies numbness/tingling, cold sensation, fever/chills, chest pain, shortness of breath, abdominal pain, headache, urinary symptoms, or rashes. ? ?History Limitations: Difficulty communicating given underlying mental illness. ? ?  ? ? ?Physical Exam  ?Triage Vital Signs: ?ED Triage Vitals  ?Enc Vitals Group  ?   BP 09/28/21 1430 (!) 137/105  ?   Pulse Rate 09/28/21 1430 89  ?   Resp 09/28/21 1430 18  ?   Temp 09/28/21 1430 98.6 ?F (37 ?C)  ?   Temp src --   ?   SpO2 09/28/21 1430 97 %  ?   Weight 09/28/21 1428 299 lb 13.2 oz (136 kg)  ?   Height --   ?   Head Circumference --   ?   Peak Flow --   ?   Pain Score --   ?   Pain Loc --   ?   Pain Edu? --   ?   Excl. in GC? --   ? ? ?Most recent vital signs: ?Vitals:  ? 09/28/21 1430  ?BP: (!) 137/105  ?Pulse: 89  ?Resp: 18  ?Temp: 98.6 ?F (37 ?C)  ?SpO2: 97%  ? ? ?General: Awake, NAD.  ?Skin: Warm, dry.  ?CV: Good peripheral perfusion.  ?Resp: Normal effort.  ?Abd: Soft, non-tender. No distention.  ?Neuro: At baseline. No gross neurological deficits.  ?Other: No gross deformities appreciated in the left lower extremity.  Normal flexion extension at the knee.  Mild bony tenderness appreciated along the midshaft tibia/fibula.  No warmth or erythema.  Pulse, motor, sensation intact distally.  Normal straight leg test.  Negative  anterior drawer.  No pain elicited with valgus/varus maneuver ? ?Physical Exam ? ? ? ?ED Results / Procedures / Treatments  ?Labs ?(all labs ordered are listed, but only abnormal results are displayed) ?Labs Reviewed - No data to display ? ? ?EKG ?Not applicable. ? ? ?RADIOLOGY ? ?ED Provider Interpretation: I personally reviewed and interpreted this x-ray, no evidence of fracture based on my interpretation. ? ?DG Tibia/Fibula Left ? ?Result Date: 09/28/2021 ?CLINICAL DATA:  Pain EXAM: LEFT TIBIA AND FIBULA - 2 VIEW COMPARISON:  None. FINDINGS: No fracture or dislocation is seen. There are no focal lytic lesions. There are no opaque foreign bodies in the soft tissues. IMPRESSION: No significant radiographic abnormality is seen in the left tibia and fibula. Electronically Signed   By: Ernie Avena M.D.   On: 09/28/2021 15:37   ? ?PROCEDURES: ? ?Critical Care performed: None. ? ?Procedures ? ? ? ?MEDICATIONS ORDERED IN ED: ?Medications  ?acetaminophen (TYLENOL) tablet 1,000 mg (1,000 mg Oral Given 09/28/21 1541)  ? ? ? ?IMPRESSION / MDM / ASSESSMENT AND PLAN / ED COURSE  ?I reviewed the triage vital signs and the nursing notes. ?             ?               ? ? ?  Differential diagnosis includes, but is not limited to, tibia fracture, fibula fracture, knee sprain, gastrocnemius sprain. ? ?ED Course ?Patient appears well.  Vital signs within normal limits.  NAD.  Currently endorsing mild pain, we will go ahead treat him with 1000 mg acetaminophen. ? ?X-ray negative for acute findings. ? ?Assessment/Plan ?Patient presents to the emergency department for evaluation of left-sided leg pain along the midshaft tibia/fibular region.  X-ray negative for acute findings.  No evidence of cellulitis.  No leg swelling, erythema, or calf tenderness suggestive of vascular pathology.  Patient has normal range of motion and is able to ambulate well on his own.  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  Advised him to avoid strenuous activities for the next 2 weeks and to take Tylenol/ibuprofen as needed.  We will plan to discharge this patient with orthopedic follow-up to be utilized if his pain does not improve after 2 weeks.  Spoke with the patient's legal guardian over the phone who expressed understanding and agreement. ? ?Patient was provided with anticipatory guidance, return precautions, and educational material. Encouraged the patient to return to the emergency department at any time if they begin to experience any new or worsening symptoms.  ? ?  ? ? ?FINAL CLINICAL IMPRESSION(S) / ED DIAGNOSES  ? ?Final diagnoses:  ?Left leg pain  ? ? ? ?Rx / DC Orders  ? ?ED Discharge Orders   ? ?      Ordered  ?  ibuprofen (ADVIL) 800 MG tablet  Every 8 hours PRN       ? 09/28/21 1548  ? ?  ?  ? ?  ? ? ? ?Note:  This document was prepared using Dragon voice recognition software and may include unintentional dictation errors. ?  ?Varney Daily, [place] ?09/28/21 1553 ? ?  ?Merwyn Katos, MD ?09/29/21 1537 ? ?

## 2021-09-28 NOTE — ED Notes (Signed)
Pt banging on wall and yelling. ? ?Security being called at this time. ?

## 2021-09-28 NOTE — ED Notes (Addendum)
Per director Berna Spare) from [facility], patient arrived here by ambulance and the [facility],The Police told the patient's mother that they or an ambulance would bring the patient back. ?

## 2021-09-28 NOTE — ED Notes (Signed)
Patient was given crackers and peanut butter by First Nurse Janett Billow. Patient c/o left knee pain that extends to back of calf. Varicose veins noted. ?

## 2021-09-28 NOTE — ED Notes (Signed)
Patient is able to use FACES pain scale. PA-C at bedside. ?

## 2021-09-28 NOTE — ED Notes (Signed)
Barbee Cough RN talked to the mother of this pt. And she said she was going to contact the group home at this point since she was unable to come get pt.  ?When mother called back she said that group home will be paying for a cab.  ?EMS is still open for transport as well. Unsure who will come first.  ?Pt is in room. Door has been closed due to pt yelling out and screaming talking to self.  ?

## 2021-09-28 NOTE — ED Notes (Signed)
Tried to call mother of pt for ride to group home. Last time pt was seen here the mother had paid for ub[employer] to bring pt home. Mother states she lives in [place] and group home is in [place] and she has never paid for taxi for pt to go home. She says she will call group home and call us back. ?

## 2021-09-28 NOTE — ED Notes (Signed)
EMS called back, they also spoke with group home and are aware that they are arranging taxi for pt to discharge home. They are taking pt off their transport list. ?

## 2021-09-28 NOTE — ED Notes (Signed)
CN spoke with Timmie, owner of group home who told her they are arranging transportation either taxi or they will pick up. Number is [phone].  ?

## 2021-09-28 NOTE — Discharge Instructions (Addendum)
-  Take Tylenol/ibuprofen as needed for pain. ?-Allow your leg to rest, do not perform any strenuous physical activity for the next 2 weeks. ?-Follow-up with the orthopedist listed above if your symptoms fail to improve after a few weeks of conservative management. ?-Return to the emergency department anytime if you begin to experience any new or worsening symptoms. ?

## 2021-09-28 NOTE — ED Notes (Signed)
Called group home, spoke with Beverely Low at number listed, who stated he has no transportation to come pick up pt and cannot leave other residents alone. ?

## 2021-09-28 NOTE — ED Notes (Signed)
Spoke with Brandon [name] again to notify that taxi is not answering and no voice mail can be left. Brandon [name] states will come pick pt up or will send another staff member to pick pt up. Information relayed to pt. Pt is calm at this time. Security remains outside room.  ?

## 2021-09-28 NOTE — ED Notes (Signed)
Provider at bedside

## 2021-09-28 NOTE — ED Notes (Signed)
Tried to call [employer] no answer X 2. ?

## 2021-09-28 NOTE — ED Notes (Signed)
Eager to leave and easily agitated.  Did not obtain discharge vitals due earlier reports of patient having outbursts due to being her so long and  not able to leave. ?

## 2021-09-28 NOTE — ED Notes (Addendum)
First nurse note: Pt comes ems from home with bilateral leg pain since last night. LG is mom. Pt's state is his normal per roommate (laughing to self, hard to get to answer questions directly). Once asked, pt does state that his left calf hurts.  ?

## 2021-09-28 NOTE — ED Notes (Signed)
EMS called this nurse to ask about transport plan. This nurse was told by other nurse who called group home at [phone] and spoke with group home director Montine Circle who said they can not provide transportation.  ? ?EMS provided with same contact info for group home. Pt currently yelling and crying in bathroom.  ?

## 2021-09-28 NOTE — ED Notes (Signed)
Pt redirected back to room. Was wandering again in hallway. Pt screaming in room. Checked on pt, denies other needs ?

## 2021-09-28 NOTE — ED Notes (Signed)
Spoke with ems supervisor who states she spoke with timmie the owner of group home to relay that ems cannot transport pt back to group home. This rn spoke with timmie who authorizes staff at ED to call a taxi to take pt back to group home.  ?

## 2021-09-30 ENCOUNTER — Other Ambulatory Visit: Payer: Self-pay

## 2021-09-30 ENCOUNTER — Emergency Department
Admission: EM | Admit: 2021-09-30 | Discharge: 2021-09-30 | Disposition: A | Discharge status: Home / Self Care | Payer: Medicare Other | Attending: Emergency Medicine | Admitting: Emergency Medicine

## 2021-09-30 DIAGNOSIS — Z20822 Contact with and (suspected) exposure to covid-19: Secondary | ICD-10-CM | POA: Insufficient documentation

## 2021-09-30 DIAGNOSIS — F201 Disorganized schizophrenia: Secondary | ICD-10-CM | POA: Insufficient documentation

## 2021-09-30 DIAGNOSIS — F989 Unspecified behavioral and emotional disorders with onset usually occurring in childhood and adolescence: Secondary | ICD-10-CM | POA: Insufficient documentation

## 2021-09-30 DIAGNOSIS — R4689 Other symptoms and signs involving appearance and behavior: Secondary | ICD-10-CM

## 2021-09-30 DIAGNOSIS — Z79899 Other long term (current) drug therapy: Secondary | ICD-10-CM | POA: Diagnosis not present

## 2021-09-30 DIAGNOSIS — R456 Violent behavior: Secondary | ICD-10-CM | POA: Diagnosis present

## 2021-09-30 LAB — RESP PANEL BY RT-PCR (FLU A&B, COVID) ARPGX2
Influenza A by PCR: NEGATIVE
Influenza B by PCR: NEGATIVE
SARS Coronavirus 2 by RT PCR: NEGATIVE

## 2021-09-30 LAB — CBC
HCT: 45.5 % (ref 39.0–52.0)
Hemoglobin: 13.7 g/dL (ref 13.0–17.0)
MCH: 24.7 pg — ABNORMAL LOW (ref 26.0–34.0)
MCHC: 30.1 g/dL (ref 30.0–36.0)
MCV: 82 fL (ref 80.0–100.0)
Platelets: 196 10*3/uL (ref 150–400)
RBC: 5.55 MIL/uL (ref 4.22–5.81)
RDW: 14.7 % (ref 11.5–15.5)
WBC: 5.9 10*3/uL (ref 4.0–10.5)
nRBC: 0 % (ref 0.0–0.2)

## 2021-09-30 LAB — COMPREHENSIVE METABOLIC PANEL
ALT: 31 U/L (ref 0–44)
AST: 18 U/L (ref 15–41)
Albumin: 4.9 g/dL (ref 3.5–5.0)
Alkaline Phosphatase: 87 U/L (ref 38–126)
Anion gap: 9 (ref 5–15)
BUN: 15 mg/dL (ref 6–20)
CO2: 25 mmol/L (ref 22–32)
Calcium: 9.1 mg/dL (ref 8.9–10.3)
Chloride: 101 mmol/L (ref 98–111)
Creatinine, Ser: 0.9 mg/dL (ref 0.61–1.24)
GFR, Estimated: >60 mL/min (ref >60)
Glucose, Bld: 85 mg/dL (ref 70–99)
Potassium: 3.4 mmol/L — ABNORMAL LOW (ref 3.5–5.1)
Sodium: 135 mmol/L (ref 135–145)
Total Bilirubin: 0.9 mg/dL (ref 0.3–1.2)
Total Protein: 7.3 g/dL (ref 6.5–8.1)

## 2021-09-30 LAB — URINE DRUG SCREEN, QUALITATIVE (ARMC ONLY)
Amphetamines, Ur Screen: NOT DETECTED
Barbiturates, Ur Screen: NOT DETECTED
Benzodiazepine, Ur Scrn: NOT DETECTED
Cannabinoid 50 Ng, Ur ~~LOC~~: NOT DETECTED
Cocaine Metabolite,Ur ~~LOC~~: NOT DETECTED
MDMA (Ecstasy)Ur Screen: NOT DETECTED
Methadone Scn, Ur: NOT DETECTED
Opiate, Ur Screen: NOT DETECTED
Phencyclidine (PCP) Ur S: NOT DETECTED
Tricyclic, Ur Screen: NOT DETECTED

## 2021-09-30 LAB — ACETAMINOPHEN LEVEL: Acetaminophen (Tylenol), Serum: <10 ug/mL — ABNORMAL LOW (ref 10–30)

## 2021-09-30 LAB — SALICYLATE LEVEL: Salicylate Lvl: <7 mg/dL — ABNORMAL LOW (ref 7.0–30.0)

## 2021-09-30 LAB — ETHANOL: Alcohol, Ethyl (B): <10 mg/dL (ref <10)

## 2021-09-30 MED ORDER — LOSARTAN POTASSIUM 50 MG PO TABS
50.0000 mg | ORAL_TABLET | Freq: Two times a day (BID) | ORAL | Status: DC
Start: 2021-09-30 — End: 2021-10-01
  Filled 2021-09-30: qty 1

## 2021-09-30 MED ORDER — OLANZAPINE 10 MG PO TABS
10.0000 mg | ORAL_TABLET | Freq: Every day | ORAL | Status: DC
  Administered 2021-09-30: 10 mg via ORAL
  Filled 2021-09-30: qty 1

## 2021-09-30 MED ORDER — ACETAMINOPHEN 500 MG PO TABS: 1000.0000 mg | ORAL_TABLET | Freq: Four times a day (QID) | ORAL | Status: DC | PRN

## 2021-09-30 MED ORDER — ARIPIPRAZOLE 10 MG PO TABS: 20.0000 mg | ORAL_TABLET | Freq: Every evening | ORAL | Status: DC | PRN

## 2021-09-30 MED ORDER — ADULT MULTIVITAMIN W/MINERALS CH
1.0000 | ORAL_TABLET | Freq: Every day | ORAL | Status: DC
  Administered 2021-09-30: 1 via ORAL
  Filled 2021-09-30: qty 1

## 2021-09-30 MED ORDER — LORAZEPAM 1 MG PO TABS
1.0000 mg | ORAL_TABLET | Freq: Two times a day (BID) | ORAL | Status: DC
  Administered 2021-09-30 (×2): 1 mg via ORAL
  Filled 2021-09-30 (×2): qty 1

## 2021-09-30 MED ORDER — APIXABAN 5 MG PO TABS
5.0000 mg | ORAL_TABLET | Freq: Two times a day (BID) | ORAL | Status: DC
Start: 2021-09-30 — End: 2021-10-01
  Administered 2021-09-30: 5 mg via ORAL
  Filled 2021-09-30: qty 1

## 2021-09-30 MED ORDER — HALOPERIDOL 5 MG PO TABS
10.0000 mg | ORAL_TABLET | Freq: Two times a day (BID) | ORAL | Status: DC
  Administered 2021-09-30 (×2): 10 mg via ORAL
  Filled 2021-09-30 (×2): qty 2

## 2021-09-30 MED ORDER — AMLODIPINE BESYLATE 5 MG PO TABS
5.0000 mg | ORAL_TABLET | Freq: Every day | ORAL | Status: DC
Start: 2021-09-30 — End: 2021-10-01
  Administered 2021-09-30: 5 mg via ORAL
  Filled 2021-09-30: qty 1

## 2021-09-30 MED ORDER — AMANTADINE HCL 100 MG PO CAPS
100.0000 mg | ORAL_CAPSULE | Freq: Two times a day (BID) | ORAL | Status: DC
  Administered 2021-09-30 (×2): 100 mg via ORAL
  Filled 2021-09-30 (×2): qty 1

## 2021-09-30 MED ORDER — HYDROXYZINE HCL 25 MG PO TABS
25.0000 mg | ORAL_TABLET | Freq: Three times a day (TID) | ORAL | Status: DC
Start: 2021-09-30 — End: 2021-10-01
  Administered 2021-09-30 (×2): 25 mg via ORAL
  Filled 2021-09-30 (×2): qty 1

## 2021-09-30 MED ORDER — METFORMIN HCL ER 500 MG PO TB24
500.0000 mg | ORAL_TABLET | Freq: Two times a day (BID) | ORAL | Status: DC
Start: 2021-09-30 — End: 2021-10-01
  Administered 2021-09-30: 500 mg via ORAL
  Filled 2021-09-30: qty 1

## 2021-09-30 NOTE — ED Notes (Signed)
VOL  CONSULT  DONE   

## 2021-09-30 NOTE — ED Notes (Signed)
Snack tray and drink given. 

## 2021-09-30 NOTE — ED Triage Notes (Signed)
Patient to ER via [employer], voluntary. Patient from group home acting aggressively. Patient contacted crisis counselor. Was seen in ER yesterday for the same. ? ?Denies SI/ HI.  ?

## 2021-09-30 NOTE — ED Triage Notes (Signed)
Pt states he was upset because mr markus would not give him a cigarette, states they did not fight, pt has to asked repeatedly why he is here before he will answer, pt stands in an aggressive stance.  ?

## 2021-09-30 NOTE — ED Notes (Signed)
RN notified pt's legal guardian ([name] Wentzell [phone]) of pt's discharge back to group home.  Ms. Hosick was accepting of this disposition.  ?

## 2021-09-30 NOTE — ED Notes (Signed)
Group home notified pt was ready for discharge.  Staff told this Clinical research associate he would call the "boss" to see when transportation could be arranged.  RN left call back number.   ?

## 2021-09-30 NOTE — ED Notes (Signed)
Pt given sandwich tray and drink. 

## 2021-09-30 NOTE — ED Notes (Signed)
Spoke to [employer], states that he will have a staff member at a home come and pick up pt. Educated on process of picking up pt.  ?

## 2021-09-30 NOTE — ED Notes (Signed)
Pt entered another pt's room.   Security on unit to redirect patient. ?

## 2021-09-30 NOTE — ED Notes (Signed)
Group home [phone] ? ?Staff reported the patient has not taken his medications today.  ?

## 2021-09-30 NOTE — Consult Note (Signed)
[facility] Face-to-Face Psychiatry Consult  ? ?Reason for Consult:  Aggression, out of medications ?Referring Physician:  EDP ?Patient Identification: Brandon [name] ?MRN:  [id number] ?Principal Diagnosis: Schizophrenia, disorganized ?Diagnosis:  Active Problems: ?  Schizophrenia, 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[place]) ? ? ?Total Time spent with patient: 45 minutes ? ?Subjective:   ?Brandon [name] is a 31 y.o. male patient admitted with aggression. ? ?HPI:  31 yo male with a history of schizoaffective disorder, presented after becoming aggressive at his group home.  He ran out of his medications yesterday, per the group home and started decompensating by "walking off and opening the doors" throughout the night.  Today, he was making "noise" and got upset, swung at his group home staff, did not make contact.  His medications are coming "in tonight."  Client is calm and cooperative in the ED with denial of suicidal/homicidal ideations, hallucinations, and substance abuse.  Discussed the plan to restart his medications and return to the group home in a couple of hours if he continues to be calm and cooperative.  He is followed by [facility] in outpatient providers, psychiatrically cleared for discharge.  Caveat:  Flirting with another client in the dayroom at this time, no distress noted. ? ?Past Psychiatric History: schizoaffective disorder ? ?Risk to Self:  none ?Risk to Others:  none ?Prior Inpatient Therapy:  multiple times ?Prior Outpatient Therapy:  [facility] ? ?Past Medical History:  ?Past Medical History:  ?Diagnosis Date  ? ADHD (attention deficit hyperactivity disorder)   ? Bipolar 1 disorder ([place])   ? Chronic back pain   ? Chronic constipation   ? Chronic neck pain   ? Hypertension   ? Multiple sclerosis ([place]) 05/20/2013  ? left sided weakness, dysarthria  ? Non-compliance   ? Obesity   ? Pulmonary embolism ([place])   ? Schizophrenia ([place])   ? Stroke [facility])   ? left sided deficits - pt's mother denies this  ?  ?Past Surgical History:   ?Procedure Laterality Date  ? NO PAST SURGERIES    ? None    ? RADIOLOGY WITH ANESTHESIA N/A 01/16/2021  ? Procedure: MRI WITH ANESTHESIA CERVICAL AND THORASIC SPINE WITH AND WITHOUT CONTRAST;  Surgeon: Radiologist, Medication, MD;  Location: [facility];  Service: Radiology;  Laterality: N/A;  ? TOOTH EXTRACTION N/A 06/24/2019  ? Procedure: DENTAL RESTORATION/EXTRACTION OF TEETH NUMBER ONE, SIXTEEN, SEVENTEEN, NINETEEN, THIRTY-TWO;  Surgeon: Diona Browner, DDS;  Location: [facility];  Service: Oral Surgery;  Laterality: N/A;  ? ?Family History:  ?Family History  ?Problem Relation Age of Onset  ? Diabetes Mother   ? ADD / ADHD Brother   ? ?Family Psychiatric  History: see above ?Social History:  ?Social History  ? ?Substance and Sexual Activity  ?Alcohol Use Not Currently  ? Alcohol/week: 0.0 standard drinks  ? Comment: "A little bit"   ?   ?Social History  ? ?Substance and Sexual Activity  ?Drug Use Not Currently  ? Types: Marijuana  ? Comment: Last used: unknown   ?  ?Social History  ? ?Socioeconomic History  ? Marital status: Single  ?  Spouse name: Not on file  ? Number of children: 0  ? Years of education: 11th  ? Highest education level: Not on file  ?Occupational History  ? Occupation: unemployed  ?  Employer: [employer]  ?  Comment: Disbaled  ?Tobacco Use  ? Smoking status: Every Day  ?  Packs/day: 0.25  ?  Types: Cigarettes  ? Smokeless tobacco: Never  ?  Tobacco comments:  ?  2 cigarettes a day  ?Vaping Use  ? Vaping Use: Never used  ?Substance and Sexual Activity  ? Alcohol use: Not Currently  ?  Alcohol/week: 0.0 standard drinks  ?  Comment: "A little bit"   ? Drug use: Not Currently  ?  Types: Marijuana  ?  Comment: Last used: unknown   ? Sexual activity: Not on file  ?Other Topics Concern  ? Not on file  ?Social History Narrative  ? Patient lives at home with his mother.  ? Disabled.  ? Education 11 th grade .  ? Right handed.  ? Drinks caffeine occassionally  ? ?Social Determinants of Health  ? ?Financial  Resource Strain: Not on file  ?Food Insecurity: Not on file  ?Transportation Needs: Not on file  ?Physical Activity: Not on file  ?Stress: Not on file  ?Social Connections: Not on file  ? ?Additional Social History: ?  ? ?Allergies:  No Known Allergies ? ?Labs:  ?Results for orders placed or performed during the hospital encounter of 09/30/21 (from the past 48 hour(s))  ?Comprehensive metabolic panel     Status: Abnormal  ? Collection Time: 09/30/21  2:09 PM  ?Result Value Ref Range  ? Sodium 135 135 - 145 mmol/L  ? Potassium 3.4 (L) 3.5 - 5.1 mmol/L  ? Chloride 101 98 - 111 mmol/L  ? CO2 25 22 - 32 mmol/L  ? Glucose, Bld 85 70 - 99 mg/dL  ?  Comment: Glucose reference range applies only to samples taken after fasting for at least 8 hours.  ? BUN 15 6 - 20 mg/dL  ? Creatinine, Ser 0.90 0.61 - 1.24 mg/dL  ? Calcium 9.1 8.9 - 10.3 mg/dL  ? Total Protein 7.3 6.5 - 8.1 g/dL  ? Albumin 4.9 3.5 - 5.0 g/dL  ? AST 18 15 - 41 U/L  ? ALT 31 0 - 44 U/L  ? Alkaline Phosphatase 87 38 - 126 U/L  ? Total Bilirubin 0.9 0.3 - 1.2 mg/dL  ? GFR, Estimated >60 >60 mL/min  ?  Comment: (NOTE) ?Calculated using the CKD-EPI Creatinine Equation (2021) ?  ? Anion gap 9 5 - 15  ?  Comment: Performed at [facility], [address] [place], [place] [address]  ?Ethanol     Status: None  ? Collection Time: 09/30/21  2:09 PM  ?Result Value Ref Range  ? Alcohol, Ethyl (B) <10 <10 mg/dL  ?  Comment: (NOTE) ?Lowest detectable limit for serum alcohol is 10 mg/dL. ? ?For medical purposes only. ?Performed at [facility], [place], ?[place] [address] ?  ?Salicylate level     Status: Abnormal  ? Collection Time: 09/30/21  2:09 PM  ?Result Value Ref Range  ? Salicylate Lvl [id number] (L) 7.0 - 30.0 mg/dL  ?  Comment: Performed at [facility], [address], [place], [place] [address]  ?Acetaminophen level     Status: Abnormal  ? Collection Time: 09/30/21  2:09 PM  ?Result Value Ref Range  ? Acetaminophen  (Tylenol), Serum <10 (L) 10 - 30 ug/mL  ?  Comment: (NOTE) ?Therapeutic concentrations vary significantly. A range of 10-30 ug/mL  ?may be an effective concentration for many patients. However, some  ?are best treated at concentrations outside of this range. ?Acetaminophen concentrations >150 ug/mL at 4 hours after ingestion  ?and >50 ug/mL at 12 hours after ingestion are often associated with  ?toxic reactions. ? ?Performed at [facility], [place], ?[place] [address] ?  ?cbc     Status: Abnormal  ? Collection Time: 09/30/21  2:09 PM  ?Result Value Ref Range  ? WBC 5.9 4.0 - 10.5 K/uL  ? RBC 5.55 4.22 - 5.81 MIL/uL  ? Hemoglobin 13.7 13.0 - 17.0 g/dL  ? HCT 45.5 39.0 - 52.0 %  ? MCV 82.0 80.0 - 100.0 fL  ? MCH 24.7 (L) 26.0 - 34.0 pg  ? MCHC 30.1 30.0 - 36.0 g/dL  ? RDW 14.7 11.5 - 15.5 %  ? Platelets 196 150 - 400 K/uL  ? nRBC 0.0 0.0 - 0.2 %  ?  Comment: Performed at [facility], [address] [place], [place] [address]  ?Resp Panel by RT-PCR (Flu A&B, Covid) Nasopharyngeal Swab     Status: None  ? Collection Time: 09/30/21  2:22 PM  ? Specimen: Nasopharyngeal Swab; Nasopharyngeal(NP) swabs in vial transport medium  ?Result Value Ref Range  ? SARS Coronavirus 2 by RT PCR NEGATIVE NEGATIVE  ?  Comment: (NOTE) ?SARS-CoV-2 target nucleic acids are NOT DETECTED. ? ?The SARS-CoV-2 RNA is generally detectable in upper respiratory ?specimens during the acute phase of infection. The lowest ?concentration of SARS-CoV-2 viral copies this assay can detect is ?138 copies/mL. A negative result does not preclude SARS-Cov-2 ?infection and should not be used as the sole basis for treatment or ?other patient management decisions. A negative result may occur with  ?improper specimen collection/handling, submission of specimen other ?than nasopharyngeal swab, presence of viral mutation(s) within the ?areas targeted by this assay, and inadequate number of viral ?copies(<138 copies/mL). A  negative result must be combined with ?clinical observations, patient history, and epidemiological ?information. The expected result is Negative. ? ?Fact Sheet for Patients:  ?EntrepreneurPulse.com.au ? ?F

## 2021-09-30 NOTE — ED Notes (Signed)
Patient dressed out with male security presence: Patient belongings include: ?Black and white shoes ?Grey shorts ?Grey shirt ?Blue boxers ?White socks ? ?

## 2021-09-30 NOTE — ED Notes (Signed)
VOL/  PENDING  CONSULT 

## 2021-09-30 NOTE — ED Notes (Signed)
Spoke to [employer], he states that the [facility] has no staff for transport and that he lives out of the area and is unable to come to ED. He reports he will reach out to other [facility] staff and search for ride for pt. Will return call to this nurse ?

## 2021-09-30 NOTE — ED Notes (Signed)
Contacted Group Home at this time, informed by staff that he is not able to transport pt as he is only one at [facility] and cannot leave residents. Number provided for Lorne Skeens [facility] Manager - [phone]. Will contact him at this time ?

## 2021-09-30 NOTE — ED Notes (Signed)
Report received from Amy, RN including SBAR. Patient alert and oriented, warm and dry, in no acute distress. Patient denies SI, HI, AVH and pain. Patient made aware of Q15 minute rounds and security cameras for their safety. Patient instructed to come to this nurse with needs or concerns.  
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
## 2021-09-30 NOTE — ED Notes (Signed)
Pt D/C with Marcello Moores at [facility]. Marcello Moores signed for pt on paper DC copy as electronic signature is not available. Pt ambulates on own is slow as he took his meds. Home meds administered for night time as [facility] does not have them.  ?
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
## 2021-09-30 NOTE — ED Notes (Signed)
Pt stated he was nauseous.  EDP made aware.  ?

## 2021-09-30 NOTE — ED Notes (Signed)
Patient resting quietly in room. No noted distress or abnormal behaviors noted. Will continue 15 minute checks and observation by security camera for safety. 

## 2021-09-30 NOTE — ED Notes (Signed)
Dinner tray given

## 2021-09-30 NOTE — ED Notes (Signed)
Pt wandering around unit.  Nursing staff and security monitoring patient closely.  ?

## 2021-09-30 NOTE — ED Notes (Signed)
Dr. Charna Archer states that pt can have his night time meds at time of DC as pt has none at [facility]. Will administer them when pt is leaving ?

## 2021-10-21 ENCOUNTER — Emergency Department (EMERGENCY_DEPARTMENT_HOSPITAL)
Admission: EM | Admit: 2021-10-21 | Discharge: 2021-10-28 | Disposition: A | Discharge status: Home / Self Care | Payer: Medicare Other | Source: Home / Self Care | Attending: Student in an Organized Health Care Education/Training Program | Admitting: Student in an Organized Health Care Education/Training Program

## 2021-10-21 ENCOUNTER — Other Ambulatory Visit: Payer: Self-pay

## 2021-10-21 ENCOUNTER — Encounter: Payer: Self-pay | Admitting: Emergency Medicine

## 2021-10-21 DIAGNOSIS — I10 Essential (primary) hypertension: Secondary | ICD-10-CM | POA: Insufficient documentation

## 2021-10-21 DIAGNOSIS — Y900 Blood alcohol level of less than 20 mg/100 ml: Secondary | ICD-10-CM | POA: Insufficient documentation

## 2021-10-21 DIAGNOSIS — Z20822 Contact with and (suspected) exposure to covid-19: Secondary | ICD-10-CM | POA: Insufficient documentation

## 2021-10-21 DIAGNOSIS — F17210 Nicotine dependence, cigarettes, uncomplicated: Secondary | ICD-10-CM | POA: Insufficient documentation

## 2021-10-21 DIAGNOSIS — Z7901 Long term (current) use of anticoagulants: Secondary | ICD-10-CM | POA: Insufficient documentation

## 2021-10-21 DIAGNOSIS — R7309 Other abnormal glucose: Secondary | ICD-10-CM | POA: Insufficient documentation

## 2021-10-21 DIAGNOSIS — F201 Disorganized schizophrenia: Secondary | ICD-10-CM | POA: Insufficient documentation

## 2021-10-21 DIAGNOSIS — Z79899 Other long term (current) drug therapy: Secondary | ICD-10-CM | POA: Insufficient documentation

## 2021-10-21 DIAGNOSIS — R4689 Other symptoms and signs involving appearance and behavior: Secondary | ICD-10-CM

## 2021-10-21 LAB — COMPREHENSIVE METABOLIC PANEL
ALT: 24 U/L (ref 0–44)
AST: 17 U/L (ref 15–41)
Albumin: 4.5 g/dL (ref 3.5–5.0)
Alkaline Phosphatase: 94 U/L (ref 38–126)
Anion gap: 8 (ref 5–15)
BUN: 17 mg/dL (ref 6–20)
CO2: 22 mmol/L (ref 22–32)
Calcium: 9.3 mg/dL (ref 8.9–10.3)
Chloride: 107 mmol/L (ref 98–111)
Creatinine, Ser: 1.17 mg/dL (ref 0.61–1.24)
GFR, Estimated: >60 mL/min (ref >60)
Glucose, Bld: 96 mg/dL (ref 70–99)
Potassium: 4.1 mmol/L (ref 3.5–5.1)
Sodium: 137 mmol/L (ref 135–145)
Total Bilirubin: 0.5 mg/dL (ref 0.3–1.2)
Total Protein: 7.7 g/dL (ref 6.5–8.1)

## 2021-10-21 LAB — CBC
HCT: 48.5 % (ref 39.0–52.0)
Hemoglobin: 15 g/dL (ref 13.0–17.0)
MCH: 25 pg — ABNORMAL LOW (ref 26.0–34.0)
MCHC: 30.9 g/dL (ref 30.0–36.0)
MCV: 80.7 fL (ref 80.0–100.0)
Platelets: 207 10*3/uL (ref 150–400)
RBC: 6.01 MIL/uL — ABNORMAL HIGH (ref 4.22–5.81)
RDW: 14.2 % (ref 11.5–15.5)
WBC: 6.4 10*3/uL (ref 4.0–10.5)
nRBC: 0 % (ref 0.0–0.2)

## 2021-10-21 LAB — ETHANOL: Alcohol, Ethyl (B): <10 mg/dL (ref <10)

## 2021-10-21 NOTE — ED Notes (Signed)
Pt noted to have peed on self. Clean scrubs given to patient to change into and new bedsheets placed on bed ?
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
## 2021-10-21 NOTE — ED Notes (Signed)
Pt provided sandwich tray and water at this time per his request. Pt visualized in NAD at this time.  ?
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
## 2021-10-21 NOTE — ED Notes (Signed)
IVC/pending psych consult 
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
## 2021-10-22 DIAGNOSIS — F201 Disorganized schizophrenia: Secondary | ICD-10-CM

## 2021-10-22 LAB — CBC WITH DIFFERENTIAL/PLATELET
Abs Immature Granulocytes: 0.02 10*3/uL (ref 0.00–0.07)
Basophils Absolute: 0.1 10*3/uL (ref 0.0–0.1)
Basophils Relative: 1 %
Eosinophils Absolute: 0.2 10*3/uL (ref 0.0–0.5)
Eosinophils Relative: 3 %
HCT: 45.9 % (ref 39.0–52.0)
Hemoglobin: 14 g/dL (ref 13.0–17.0)
Immature Granulocytes: 0 %
Lymphocytes Relative: 25 %
Lymphs Abs: 1.5 10*3/uL (ref 0.7–4.0)
MCH: 25 pg — ABNORMAL LOW (ref 26.0–34.0)
MCHC: 30.5 g/dL (ref 30.0–36.0)
MCV: 81.8 fL (ref 80.0–100.0)
Monocytes Absolute: 0.4 10*3/uL (ref 0.1–1.0)
Monocytes Relative: 8 %
Neutro Abs: 3.6 10*3/uL (ref 1.7–7.7)
Neutrophils Relative %: 63 %
Platelets: 190 10*3/uL (ref 150–400)
RBC: 5.61 MIL/uL (ref 4.22–5.81)
RDW: 14.2 % (ref 11.5–15.5)
WBC: 5.8 10*3/uL (ref 4.0–10.5)
nRBC: 0 % (ref 0.0–0.2)

## 2021-10-22 LAB — RESP PANEL BY RT-PCR (FLU A&B, COVID) ARPGX2
Influenza A by PCR: NEGATIVE
Influenza B by PCR: NEGATIVE
SARS Coronavirus 2 by RT PCR: NEGATIVE

## 2021-10-22 MED ORDER — AMANTADINE HCL 100 MG PO CAPS
100.0000 mg | ORAL_CAPSULE | Freq: Two times a day (BID) | ORAL | Status: DC
  Administered 2021-10-22 – 2021-10-28 (×13): 100 mg via ORAL
  Filled 2021-10-22 (×14): qty 1

## 2021-10-22 MED ORDER — LORATADINE 10 MG PO TABS
10.0000 mg | ORAL_TABLET | Freq: Every day | ORAL | Status: DC | PRN
  Administered 2021-10-26 – 2021-10-27 (×2): 10 mg via ORAL
  Filled 2021-10-22 (×2): qty 1

## 2021-10-22 MED ORDER — ARIPIPRAZOLE 10 MG PO TABS
20.0000 mg | ORAL_TABLET | Freq: Every evening | ORAL | Status: DC | PRN
  Filled 2021-10-22: qty 2

## 2021-10-22 MED ORDER — CLOZAPINE 100 MG PO TABS: 200.0000 mg | ORAL_TABLET | Freq: Every day | ORAL | Status: DC

## 2021-10-22 MED ORDER — HYDROXYZINE HCL 25 MG PO TABS
25.0000 mg | ORAL_TABLET | Freq: Three times a day (TID) | ORAL | Status: DC
  Administered 2021-10-22 – 2021-10-28 (×19): 25 mg via ORAL
  Filled 2021-10-22 (×19): qty 1

## 2021-10-22 MED ORDER — HALOPERIDOL 5 MG PO TABS
10.0000 mg | ORAL_TABLET | Freq: Two times a day (BID) | ORAL | Status: DC
  Administered 2021-10-22 – 2021-10-28 (×13): 10 mg via ORAL
  Filled 2021-10-22 (×13): qty 2

## 2021-10-22 MED ORDER — APIXABAN 5 MG PO TABS
5.0000 mg | ORAL_TABLET | Freq: Two times a day (BID) | ORAL | Status: DC
Start: 2021-10-22 — End: 2021-10-28
  Administered 2021-10-22 – 2021-10-28 (×13): 5 mg via ORAL
  Filled 2021-10-22 (×13): qty 1

## 2021-10-22 MED ORDER — OLANZAPINE 10 MG PO TABS
10.0000 mg | ORAL_TABLET | Freq: Every day | ORAL | Status: DC
  Administered 2021-10-22 – 2021-10-27 (×6): 10 mg via ORAL
  Filled 2021-10-22 (×6): qty 1

## 2021-10-22 MED ORDER — HALOPERIDOL LACTATE 5 MG/ML IJ SOLN
5.0000 mg | Freq: Once | INTRAMUSCULAR | Status: AC
  Administered 2021-10-22: 5 mg via INTRAMUSCULAR
  Filled 2021-10-22: qty 1

## 2021-10-22 MED ORDER — DIPHENHYDRAMINE HCL 50 MG/ML IJ SOLN
50.0000 mg | Freq: Once | INTRAMUSCULAR | Status: AC
  Administered 2021-10-22: 50 mg via INTRAMUSCULAR
  Filled 2021-10-22: qty 1

## 2021-10-22 MED ORDER — LORAZEPAM 2 MG/ML IJ SOLN
2.0000 mg | Freq: Once | INTRAMUSCULAR | Status: AC
  Administered 2021-10-22: 2 mg via INTRAMUSCULAR
  Filled 2021-10-22: qty 1

## 2021-10-22 MED ORDER — TOPIRAMATE 25 MG PO TABS
25.0000 mg | ORAL_TABLET | Freq: Two times a day (BID) | ORAL | Status: DC
  Administered 2021-10-22 – 2021-10-28 (×13): 25 mg via ORAL
  Filled 2021-10-22 (×15): qty 1

## 2021-10-22 MED ORDER — LOSARTAN POTASSIUM 50 MG PO TABS
50.0000 mg | ORAL_TABLET | Freq: Two times a day (BID) | ORAL | Status: DC
  Administered 2021-10-22 – 2021-10-28 (×13): 50 mg via ORAL
  Filled 2021-10-22 (×15): qty 1

## 2021-10-22 MED ORDER — METFORMIN HCL ER 500 MG PO TB24
500.0000 mg | ORAL_TABLET | Freq: Two times a day (BID) | ORAL | Status: DC
  Administered 2021-10-22 – 2021-10-28 (×13): 500 mg via ORAL
  Filled 2021-10-22 (×14): qty 1

## 2021-10-22 MED ORDER — LORAZEPAM 1 MG PO TABS
1.0000 mg | ORAL_TABLET | Freq: Two times a day (BID) | ORAL | Status: DC
  Administered 2021-10-22 – 2021-10-28 (×13): 1 mg via ORAL
  Filled 2021-10-22 (×13): qty 1

## 2021-10-22 MED ORDER — ASENAPINE MALEATE 5 MG SL SUBL
10.0000 mg | SUBLINGUAL_TABLET | Freq: Every day | SUBLINGUAL | Status: DC
  Administered 2021-10-22 – 2021-10-27 (×6): 10 mg via SUBLINGUAL
  Filled 2021-10-22 (×7): qty 2

## 2021-10-22 MED ORDER — AMLODIPINE BESYLATE 5 MG PO TABS
5.0000 mg | ORAL_TABLET | Freq: Every day | ORAL | Status: DC
  Administered 2021-10-22 – 2021-10-28 (×7): 5 mg via ORAL
  Filled 2021-10-22 (×7): qty 1

## 2021-10-22 NOTE — ED Notes (Signed)
IVC/pending psych consult 

## 2021-10-22 NOTE — ED Notes (Signed)
Sandwich tray provided.  100% consumed, pt tolerated w/o complaints.  Waste discarded appropriately.  ? ?

## 2021-10-22 NOTE — ED Notes (Signed)
Pt given snack and drink 

## 2021-10-22 NOTE — ED Notes (Signed)
Pt repeatedly asking for a snack. This tech educated pt on the rules for snack handouts in the quad. Pt asked for water. This tech provided pt with a cup of ice water. ?

## 2021-10-22 NOTE — ED Notes (Signed)
Pt incontinent of urine; given new clothing and new sheets.  ?Encouraged patient to tidy room, provided trash can for patient to throw away any trash in patient room with staff supervision. ? ?

## 2021-10-22 NOTE — ED Notes (Signed)
IVC  CONSULT  DONE   

## 2021-10-22 NOTE — BH Assessment (Signed)
Writer spoke to patient's mom/legal guardiuan, [name] Kent 336 [phone]. Mom reports patient needs higher level of care and she is working with someone to find placement for patient. Mom states current group home is not equipped to handle patient when he refuses to take his medication. Writer encouraged mom to follow up with Care Coordinator for placement. Writer also informed mom that patient will need admission at this time but will need to be stable on his meds before sending out referrals. Mom verbalized understanding.  ?

## 2021-10-22 NOTE — ED Provider Notes (Signed)
Emergency Medicine Observation Re-evaluation Note ? ?Brandon [name] is a 31 y.o. male, seen on rounds today.  Pt initially presented to the ED for complaints of Psychiatric Evaluation ?Currently, the patient is resting comfortably. ? ?Physical Exam  ?BP 113/90   Pulse 88   Temp 98.1 ?F (36.7 ?C) (Oral)   Resp 17   Wt 131.5 kg   SpO2 100%   BMI 37.23 kg/m?  ?Physical Exam ?Gen: No acute distress  ?Resp: Normal rise and fall of chest ?Neuro: Moving all four extremities ?Psych: Resting currently, calm and cooperative when awake ? ? ? ?ED Course / MDM  ?EKG:  ? ?I have reviewed the labs performed to date as well as medications administered while in observation.  Recent changes in the last 24 hours include no acute events overnight. ? ?Plan  ?Current plan is for psychiatric disposition. ?Brandon [name] is under involuntary commitment. ?  ? ?  ?Marc Sivertsen, Layla Maw, DO ?10/22/21 [phone] ? ?

## 2021-10-22 NOTE — ED Notes (Signed)
Given additional lunch per request. ?

## 2021-10-22 NOTE — ED Notes (Signed)
Pt came out of room and started walking toward ambulance door when security officer stepped in front of him and asked him where he was going- pt replied he was going to the bathroom and he and the security guard went back and forth for about 30 seconds on which direction the bathroom was in- this RN intervened and asked pt where he was going to which he stated "bathroom" again- this RN opened the bathroom door to show him the bathroom- pt went to bathroom and this RN went to get medications together for another pt- this RN came back and Erskine Squibb, RN was in room with pt trying to de-escalate him after he was hitting his head against the wall in the bathroom- Les, ED medic gave pt a food tray to assist with de-escalation and pt calmed down ?

## 2021-10-22 NOTE — ED Notes (Signed)
Pt given cup of ice water 

## 2021-10-22 NOTE — Consult Note (Signed)
[facility] Face-to-Face Psychiatry Consult  ? 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 ? ?Total Time spent with patient: 30 minutes ? ?Subjective: "I am at the hospital." ?Brandon [name] is a 31 y.o. male patient admitted with aggressive behavior according to group home ? ?HPI: Patient was seen face to face this morning, after being here since last night.  Patient was laughing and smiling inappropriately.  Not responding to questions.  Mumbles.  Makes adequate eye contact.  UDS has not been collected. ?ED notes, patient was brought in by group home under IVC for aggressive behaviors.  Apparently, he was attempting to break windows and doors when police arrived on the scene.  Group home states that patient was hallucinating and saying that people are trying to shoot him.  Prior to this evaluation, patient did take antipsychotic medications.  He will need to be reevaluated when he is more stable.  Patient does verbalize that he is at the hospital.  Denies suicidal or homicidal ideation.  Does not answer any more questions, but appears to be responding to internal stimuli.  Patient has been noted to be banging on walls and talking to himself.  Patient was incontinent last evening.  Staff has been unable to collect UDS to this point. ? ?Past Psychiatric History: Disorganized schizophrenia ? ?Risk to Self:   ?Risk to Others:   ?Prior Inpatient Therapy:   ?Prior Outpatient Therapy:   ? ?Past Medical History:  ?Past Medical History:  ?Diagnosis Date  ? ADHD (attention deficit hyperactivity disorder)   ? Bipolar 1 disorder ([employer])   ? Chronic back pain   ? Chronic constipation   ? Chronic neck pain   ? Hypertension   ? Multiple sclerosis ([place]) 05/20/2013  ? left sided weakness, dysarthria  ? Non-compliance   ?  Obesity   ? Pulmonary embolism ([place])   ? Schizophrenia ([place])   ? Stroke [facility])   ? left sided deficits - pt's mother denies this  ?  ?Past Surgical History:  ?Procedure Laterality Date  ? NO PAST SURGERIES    ? None    ? RADIOLOGY WITH ANESTHESIA N/A 01/16/2021  ? Procedure: MRI WITH ANESTHESIA CERVICAL AND THORASIC SPINE WITH AND WITHOUT CONTRAST;  Surgeon: Radiologist, Medication, MD;  Location: [place];  Service: Radiology;  Laterality: N/A;  ? TOOTH EXTRACTION N/A 06/24/2019  ? Procedure: DENTAL RESTORATION/EXTRACTION OF TEETH NUMBER ONE, SIXTEEN, SEVENTEEN, NINETEEN, THIRTY-TWO;  Surgeon: Diona Browner, DDS;  Location: [place];  Service: Oral Surgery;  Laterality: N/A;  ? ?Family History:  ?Family History  ?Problem Relation Age of Onset  ? Diabetes Mother   ? ADD / ADHD Brother   ? ?Family Psychiatric  History: Unknown ?Social History:  ?Social History  ? ?Substance and Sexual Activity  ?Alcohol Use Not Currently  ? Alcohol/week: 0.0 standard drinks  ? Comment: "A little bit"   ?   ?Social History  ? ?Substance and Sexual Activity  ?Drug Use Not Currently  ? Types: Marijuana  ? Comment: Last used: unknown   ?  ?Social History  ? ?Socioeconomic History  ? Marital status: Single  ?  Spouse name: Not on file  ? Number of children: 0  ? Years of education: 11th  ? Highest education level: Not on file  ?Occupational History  ? Occupation: unemployed  ?  Employer: [employer]  ?  Comment: Disbaled  ?Tobacco Use  ? Smoking status: Every Day  ?  Packs/day: 0.25  ?  Types: Cigarettes  ? Smokeless tobacco: Never  ? Tobacco comments:  ?  2 cigarettes a day  ?Vaping Use  ? Vaping Use: Never used  ?Substance and Sexual Activity  ? Alcohol use: Not Currently  ?  Alcohol/week: 0.0 standard drinks  ?  Comment: "A little bit"   ? Drug use: Not Currently  ?  Types: Marijuana  ?  Comment: Last used: unknown   ? Sexual activity: Not on file  ?Other Topics Concern  ? Not on file  ?Social History Narrative  ? Patient lives at home  with his mother.  ? Disabled.  ? Education 11 th grade .  ? Right handed.  ? Drinks caffeine occassionally  ? ?Social Determinants of Health  ? ?Financial Resource Strain: Not on file  ?Food Insecurity: Not on file  ?Transportation Needs: Not on file  ?Physical Activity: Not on file  ?Stress: Not on file  ?Social Connections: Not on file  ? ?Additional Social History: ?  ? ?Allergies:  No Known Allergies ? ?Labs:  ?Results for orders placed or performed during the hospital encounter of 10/21/21 (from the past 48 hour(s))  ?Comprehensive metabolic panel     Status: None  ? Collection Time: 10/21/21  8:54 PM  ?Result Value Ref Range  ? Sodium 137 135 - 145 mmol/L  ? Potassium 4.1 3.5 - 5.1 mmol/L  ?  Comment: HEMOLYSIS AT THIS LEVEL MAY AFFECT RESULT  ? Chloride 107 98 - 111 mmol/L  ? CO2 22 22 - 32 mmol/L  ? Glucose, Bld 96 70 - 99 mg/dL  ?  Comment: Glucose reference range applies only to samples taken after fasting for at least 8 hours.  ? BUN 17 6 - 20 mg/dL  ? Creatinine, Ser 1.17 0.61 - 1.24 mg/dL  ? Calcium 9.3 8.9 - 10.3 mg/dL  ? Total Protein 7.7 6.5 - 8.1 g/dL  ? Albumin 4.5 3.5 - 5.0 g/dL  ? AST 17 15 - 41 U/L  ? ALT 24 0 - 44 U/L  ? Alkaline Phosphatase 94 38 - 126 U/L  ? Total Bilirubin 0.5 0.3 - 1.2 mg/dL  ? GFR, Estimated >60 >60 mL/min  ?  Comment: (NOTE) ?Calculated using the CKD-EPI Creatinine Equation (2021) ?  ? Anion gap 8 5 - 15  ?  Comment: Performed at [facility], [address], [place], [place] [address]  ?Ethanol     Status: None  ? Collection Time: 10/21/21  8:54 PM  ?Result Value Ref Range  ? Alcohol, Ethyl (B) <10 <10 mg/dL  ?  Comment: (NOTE) ?Lowest detectable limit for serum alcohol is 10 mg/dL. ? ?For medical purposes only. ?Performed at [facility], [place], ?[place] [address] ?  ?cbc     Status: Abnormal  ? Collection Time: 10/21/21  8:54 PM  ?Result Value Ref Range  ? WBC 6.4 4.0 - 10.5 K/uL  ? RBC 6.01 (H) 4.22 - 5.81 MIL/uL  ? Hemoglobin  15.0 13.0 - 17.0 g/dL  ? HCT 48.5 39.0 - 52.0 %  ? MCV 80.7 80.0 - 100.0 fL  ? MCH 25.0 (L) 26.0 - 34.0 pg  ? MCHC 30.9 30.0 - 36.0 g/dL  ? RDW 14.2 11.5 - 15.5 %  ? Platelets 207 150 - 400 K/uL  ? nRBC 0.0 0.0 - 0.2 %  ?  Comment: Performed at [facility], [address] [place], [place] [address]  ?Resp Panel by RT-PCR (Flu A&B, Covid) Nasopharyngeal Swab     Status: None  ? Collection Time: 10/21/21 11:40 PM  ? Specimen: Nasopharyngeal Swab; Nasopharyngeal(NP) swabs in vial transport medium  ?Result Value Ref Range  ? SARS Coronavirus 2 by RT PCR NEGATIVE NEGATIVE  ?  Comment: (NOTE) ?SARS-CoV-2 target nucleic acids are NOT DETECTED. ? ?The SARS-CoV-2 RNA is generally detectable in upper respiratory ?specimens during the acute phase of infection. The lowest ?concentration of SARS-CoV-2 viral copies this assay can detect is ?138 copies/mL. A negative result does not preclude SARS-Cov-2 ?infection and should not be used as the sole basis for treatment or ?other patient management decisions. A negative result may occur with  ?improper specimen collection/handling, submission of specimen other ?than nasopharyngeal swab, presence of viral mutation(s) within the ?areas targeted by this assay, and inadequate number of viral ?copies(<138 copies/mL). A negative result must be combined with ?clinical observations, patient history, and epidemiological ?information. The expected result is Negative. ? ?Fact Sheet for Patients:  ?EntrepreneurPulse.com.au ? ?Fact Sheet for Healthcare Providers:  ?IncredibleEmployment.be ? ?This test is no t yet approved or cleared by the [place] FDA and  ?has been authorized for detection and/or diagnosis of SARS-CoV-2 by ?FDA under an Emergency Use Authorization (EUA). This EUA will remain  ?in effect (meaning this test can be used) for the duration of the ?COVID-19 declaration under Section 564(b)(1) of the Act, 21 ?U.S.C.section  360bbb-3(b)(1), unless the authorization is terminated  ?or revoked sooner.  ? ? ?  ? Influenza A by PCR NEGATIVE NEGATIVE  ? Influenza B by PCR NEGATIVE NEGATIVE  ?  Comment: (NOTE) ?The Xpert Xpress SARS-CoV-2/FLU/R

## 2021-10-22 NOTE — BH Assessment (Addendum)
Referral information for Psychiatric Hospitalization faxed to;  ?  ?[facility] ([phone]-or- [phone]),  ?  ?[facility] ([phone]), No adult beds available  ?  ?[facility] ([phone]),  ?  ?[facility] ([phone] -or- [phone]),  ?  ?[facility] ([phone]) ?

## 2021-10-22 NOTE — ED Notes (Signed)
Pt was walking to the restroom. Staff and security made him aware that another pt is currently using the restroom. Pt got very agitated. Pt started yelling incoherent words. Pt went back to room and started hitting the garage door. 2 security officers stand by.  ?

## 2021-10-23 NOTE — ED Notes (Signed)
Pt. Transferred to [facility] , room# 5 from ED .Patient was screened by security before entering the unit. Report to include Situation, Background, Assessment and Recommendations from [place] . Pt. Oriented to unit including Q15 minute rounds as well as the security cameras for their protection. Patient is alert and oriented, warm and dry in no acute distress.   ?

## 2021-10-23 NOTE — ED Notes (Signed)
Patient provided snack at appropriate snack time.  Pt consumed 100% of snack provided, tolerated well w/o complaints   Trash disposted of appropriately by patient.  

## 2021-10-23 NOTE — ED Notes (Signed)
Report to include Situation, Background, Assessment, and Recommendations received from Katie RN. Patient alert and oriented, warm and dry, in no acute distress. Patient denies SI, HI, AVH and pain. Patient made aware of Q15 minute rounds and security cameras for their safety. Patient instructed to come to me with needs or concerns.  

## 2021-10-23 NOTE — ED Notes (Signed)
Pt yelling for staff from room.  Staff/Security went to assist patient.  Mr Brandon [name] was lying on the bed and had spilled his water all over himself and the bedding.  Staff had to physically assist pt to his feet and walk with him to the bathroom.  Clean clothing was provided, staff entered pt bedroom to change bed linens when pt started yelling "help" again from the bathroom.  Staff and security entered bathroom where pt was leaning against the wall and was unable to pull up his own pants.  Staff assisted pt in dressing and back to room where patient has laid back down.  Cont to monitor as ordered ?

## 2021-10-23 NOTE — ED Notes (Addendum)
Unlocked bathroom door to allow patient to shower.  Staff continuously monitored dayroom outside of bathroom door during pt shower.  Pt was given hygiene items as well as:  I clean top, 1 clean bottom, with 1 pair of disposable underwear.  Pt changed out into clean clothing.  Staff disposed of all shower supplies. Shower room cleaned and secured for next use.  

## 2021-10-23 NOTE — ED Notes (Signed)
IVC/  PENDING  PLACEMENT 

## 2021-10-23 NOTE — ED Notes (Signed)
Hospital meal provided, pt tolerated w/o complaints.  Waste discarded appropriately.  

## 2021-10-23 NOTE — ED Notes (Signed)
During nursing assessment was A/Ox 3 .  Marland KitchenMr [name]  stated that he does not currently have thoughts or feelings of SI/HI.  Brandon [name] also reported that he is not currently having auditory or visual hallucinations.  Pt affect is flat , eye contact is poor , speech is quiet in volume and mumbled  with unintelligible  verbiage noted.  Staff addressed any feelings or concerns that have been brought up.  Medications were administered as ordered.  Pt is withdraw to room and does not socialize with peers or staff. Continue to monitor patient as ordered for any changes in behaviors and for continued safety.  ? ?

## 2021-10-24 DIAGNOSIS — F201 Disorganized schizophrenia: Secondary | ICD-10-CM | POA: Diagnosis not present

## 2021-10-24 NOTE — ED Notes (Signed)
Patient resting quietly in room. No noted distress or abnormal behaviors noted. Will continue 15 minute checks and observation by security camera for safety. 

## 2021-10-24 NOTE — BH Assessment (Signed)
Referral information for Psychiatric Hospitalization faxed to;  ? ?[facility] [phone]- [phone]) ? ?[facility] [phone]),  ? ?[facility] [phone]), ? ?[facility] [phone] or [phone]) ? ?[facility] [phone]),  ? ?[facility] [phone] -or- [phone]),  ? ?[facility] [phone]) ? ?[facility] [phone]). ? ?[facility] [phone]) ?

## 2021-10-24 NOTE — ED Notes (Signed)
IVC/pending psyvh admit ? ?

## 2021-10-24 NOTE — Consult Note (Signed)
[facility] Psych ED Progress Note ? ?10/24/2021 2:34 PM ?Brandon [name]  ?MRN:  [id number] ? ? ?Method of visit?: Face to Face , 2:10 PM ? ?Subjective:  When asked how he is doing  today, patient states "I'm doing good, how about you?' Patient has remained calm yesterday and today. No behavioral outburst; calm, polite. Writer asked patient if he took his medications at group home, he responded that he does "sometimes." He reports that he  doesn't think he needs them. Writer told patient that the medications keep him calmer and able to stay out of the hospital, where he has more freedom. Patient has been taking medications as prescribed in the hospital. Behavior since presentation to the ED is improved.He is making good eye contact, responds more appropriately to questions. No aggression. No expression of suicidal thoughts. ? ?Patient remains in ED awaiting bed on inpatient psychiatric unit.    ? ?   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 ?Total Time spent with patient: 15 minutes ? ?Past Psychiatric History: see prior ? ?Past Medical History:  ?Past Medical History:  ?Diagnosis Date  ? ADHD (attention deficit hyperactivity disorder)   ? Bipolar 1 disorder ([place])   ? Chronic back pain   ? Chronic constipation   ? Chronic neck pain   ? Hypertension   ? Multiple sclerosis ([place]) 05/20/2013  ? left sided weakness, dysarthria  ? Non-compliance   ? Obesity   ? Pulmonary embolism ([place])   ? Schizophrenia ([place])   ? Stroke [facility])   ? left sided deficits - pt's mother denies this  ?  ?Past Surgical History:  ?Procedure Laterality Date  ? NO PAST SURGERIES    ? None    ? RADIOLOGY WITH ANESTHESIA N/A 01/16/2021  ? Procedure: MRI WITH ANESTHESIA CERVICAL AND THORASIC SPINE WITH AND WITHOUT CONTRAST;  Surgeon: Radiologist, Medication, MD;  Location: [place];  Service: Radiology;  Laterality: N/A;  ? TOOTH EXTRACTION N/A 06/24/2019  ? Procedure: DENTAL  RESTORATION/EXTRACTION OF TEETH NUMBER ONE, SIXTEEN, SEVENTEEN, NINETEEN, THIRTY-TWO;  Surgeon: Diona Browner, DDS;  Location: [place];  Service: Oral Surgery;  Laterality: N/A;  ? ?Family History:  ?Family History  ?Problem Relation Age of Onset  ? Diabetes Mother   ? ADD / ADHD Brother   ? ?Family Psychiatric  History: unknown ?Social History:  ?Social History  ? ?Substance and Sexual Activity  ?Alcohol Use Not Currently  ? Alcohol/week: 0.0 standard drinks  ? Comment: "A little bit"   ?   ?Social History  ? ?Substance and Sexual Activity  ?Drug Use Not Currently  ? Types: Marijuana  ? Comment: Last used: unknown   ?  ?Social History  ? ?Socioeconomic History  ? Marital status: Single  ?  Spouse name: Not on file  ? Number of children: 0  ? Years of education: 11th  ? Highest education level: Not on file  ?Occupational History  ? Occupation: unemployed  ?  Employer: [employer]  ?  Comment: Disbaled  ?Tobacco Use  ? Smoking status: Every Day  ?  Packs/day: 0.25  ?  Types: Cigarettes  ? Smokeless tobacco: Never  ? Tobacco comments:  ?  2 cigarettes a day  ?Vaping Use  ? Vaping Use: Never used  ?Substance and Sexual Activity  ? Alcohol use: Not Currently  ?  Alcohol/week: 0.0 standard drinks  ?  Comment: "A little bit"   ? Drug use: Not  Currently  ?  Types: Marijuana  ?  Comment: Last used: unknown   ? Sexual activity: Not on file  ?Other Topics Concern  ? Not on file  ?Social History Narrative  ? Patient lives at home with his mother.  ? Disabled.  ? Education 11 th grade .  ? Right handed.  ? Drinks caffeine occassionally  ? ?Social Determinants of Health  ? ?Financial Resource Strain: Not on file  ?Food Insecurity: Not on file  ?Transportation Needs: Not on file  ?Physical Activity: Not on file  ?Stress: Not on file  ?Social Connections: Not on file  ? ? ?Sleep: Good ? ?Appetite:  Good ? ?Current Medications: ?Current Facility-Administered Medications  ?Medication Dose Route Frequency Provider Last Rate Last  Admin  ? amantadine (SYMMETREL) capsule 100 mg  100 mg Oral BID Ward, Kristen N, DO   100 mg at 10/24/21 0853  ? amLODipine (NORVASC) tablet 5 mg  5 mg Oral Daily Ward, Kristen N, DO   5 mg at 10/24/21 [id number]  ? apixaban (ELIQUIS) tablet 5 mg  5 mg Oral BID Ward, Kristen N, DO   5 mg at 10/24/21 [id number]  ? ARIPiprazole (ABILIFY) tablet 20 mg  20 mg Oral QHS PRN Ward, Delice Bison, DO      ? asenapine (SAPHRIS) sublingual tablet 10 mg  10 mg Sublingual Q1200 Ward, Kristen N, DO   10 mg at 10/24/21 1215  ? haloperidol (HALDOL) tablet 10 mg  10 mg Oral BID Ward, Kristen N, DO   10 mg at 10/24/21 [id number]  ? hydrOXYzine (ATARAX) tablet 25 mg  25 mg Oral TID Ward, Kristen N, DO   25 mg at 10/24/21 [id number]  ? loratadine (CLARITIN) tablet 10 mg  10 mg Oral Daily PRN Ward, Delice Bison, DO      ? LORazepam (ATIVAN) tablet 1 mg  1 mg Oral BID Ward, Kristen N, DO   1 mg at 10/24/21 [id number]  ? losartan (COZAAR) tablet 50 mg  50 mg Oral BID Ward, Kristen N, DO   50 mg at 10/24/21 [id number]  ? metFORMIN (GLUCOPHAGE-XR) 24 hr tablet 500 mg  500 mg Oral BID WC Ward, Kristen N, DO   500 mg at 10/24/21 0850  ? OLANZapine (ZYPREXA) tablet 10 mg  10 mg Oral QHS Ward, Kristen N, DO   10 mg at 10/23/21 2137  ? topiramate (TOPAMAX) tablet 25 mg  25 mg Oral BID Ward, Kristen N, DO   25 mg at 10/24/21 [id number]  ? ?Current Outpatient Medications  ?Medication Sig Dispense Refill  ? amantadine (SYMMETREL) 100 MG capsule Take 100 mg by mouth 2 (two) times daily. 0800,1900    ? amLODipine (NORVASC) 5 MG tablet Take 5 mg by mouth in the morning and at bedtime.    ? apixaban (ELIQUIS) 5 MG TABS tablet Take 5 mg by mouth 2 (two) times daily.     ? ARIPiprazole (ABILIFY) 20 MG tablet Take 20 mg by mouth at bedtime as needed (anxiety).    ? docusate sodium (COLACE) 100 MG capsule Take 400 mg by mouth at bedtime.    ? haloperidol (HALDOL) 10 MG tablet Take 10 mg by mouth 2 (two) times daily.    ? hydrOXYzine (ATARAX/VISTARIL) 25 MG tablet Take 25 mg by mouth 3 (three) times  daily.    ? metFORMIN (GLUCOPHAGE-XR) 500 MG 24 hr tablet Take 500 mg by mouth 2 (two) times daily.    ? Multiple Vitamin (MULTIVITAMIN WITH MINERALS) TABS  tablet Take 1 tablet by mouth daily at 12 noon.    ? OLANZapine (ZYPREXA) 10 MG tablet Take 10 mg by mouth at bedtime.    ? topiramate (TOPAMAX) 25 MG tablet TAKE ONE TABLET BY MOUTH TWICE A DAY 60 tablet 5  ? acetaminophen (TYLENOL) 500 MG tablet Take 1,000 mg by mouth every 6 (six) hours as needed for moderate pain or headache.    ? Asenapine Maleate 10 MG SUBL Place 10 mg under the tongue daily at 12 noon. (Patient not taking: Reported on 10/22/2021)    ? cloZAPine (CLOZARIL) 100 MG tablet Take 200 mg by mouth at bedtime. (Patient not taking: Reported on 10/22/2021)    ? ibuprofen (ADVIL) 800 MG tablet Take 1 tablet (800 mg total) by mouth every 8 (eight) hours as needed. 30 tablet 0  ? loratadine (CLARITIN) 10 MG tablet Take 10 mg by mouth daily as needed for allergies. (Patient not taking: Reported on 10/22/2021)    ? LORazepam (ATIVAN) 1 MG tablet Take 1 mg by mouth 2 (two) times daily. (Patient not taking: Reported on 10/22/2021)    ? losartan (COZAAR) 50 MG tablet Take 50 mg by mouth 2 (two) times daily. (Patient not taking: Reported on 10/22/2021)    ? LUMIGAN 0.01 % SOLN Place 1 drop into both eyes at bedtime. (Patient not taking: Reported on 10/22/2021)    ? ? ?Lab Results: No results found for this or any previous visit (from the past 48 hour(s)). ? ?Blood Alcohol level:  ?Lab Results  ?Component Value Date  ? ETH <10 10/21/2021  ? ETH <10 09/30/2021  ? ? ?Physical Findings: ?AIMS:  , ,  ,  ,    ?CIWA:    ?COWS:    ? ?Musculoskeletal: ?Strength & Muscle Tone: within normal limits ?Gait & Station: normal ?Patient leans: N/A ? ?Psychiatric Specialty Exam: ? ?Presentation  ?General Appearance: Appropriate for Environment ? ?Eye Contact:Good ? ?Speech:Clear and Coherent ? ?Speech Volume:Normal ? ?Handedness:Right ? ? ?Mood and Affect   ?Mood:Dysphoric ? ?Affect:Congruent (somewhat odd) ? ? ?Thought Process  ?Thought Processes:Disorganized (able to answer some questions appropriately) ? ?Descriptions of Associations:Loose ? ?Orientation:Partial ? ?Thought Content:Other (c

## 2021-10-24 NOTE — ED Notes (Signed)
Pt asking for snack repeatedly.  RN has explained snacks are only given at certain times - next snack would be given at around 8 pm.   ?

## 2021-10-24 NOTE — ED Notes (Signed)
IVC pending placement 

## 2021-10-24 NOTE — ED Notes (Signed)
Pt received dinner tray.

## 2021-10-24 NOTE — ED Notes (Signed)
Pt given snack. 

## 2021-10-24 NOTE — ED Provider Notes (Signed)
Emergency Medicine Observation Re-evaluation Note ? ?Brandon [name] is a 32 y.o. male, seen on rounds today.  Pt initially presented to the ED for complaints of Psychiatric Evaluation ?Currently, the patient is eating a pop-sickle, deny any acute concerns ? ?Physical Exam  ?BP 140/76 (BP Location: Left Arm)   Pulse 76   Temp 98 ?F (36.7 ?C) (Oral)   Resp 18   Wt 131.5 kg   SpO2 99%   BMI 37.23 kg/m?  ?Physical Exam ?General: no acute distress ? ?Psych: calm ? ?ED Course / MDM  ?EKG:  ? ?I have reviewed the labs performed to date as well as medications administered while in observation.  Recent changes in the last 24 hours include none. ? ?Plan  ?Current plan is for inpatient. ?Brandon [name] is under involuntary commitment. ?  ? ?  ?Gilles Chiquito, MD ?10/24/21 1519 ? ?

## 2021-10-25 LAB — RESP PANEL BY RT-PCR (FLU A&B, COVID) ARPGX2
Influenza A by PCR: NEGATIVE
Influenza B by PCR: NEGATIVE
SARS Coronavirus 2 by RT PCR: NEGATIVE

## 2021-10-25 MED ORDER — PAROXETINE HCL 10 MG PO TABS
10.0000 mg | ORAL_TABLET | Freq: Every day | ORAL | Status: DC
  Administered 2021-10-26 – 2021-10-28 (×3): 10 mg via ORAL
  Filled 2021-10-25 (×4): qty 1

## 2021-10-25 NOTE — ED Notes (Signed)

## 2021-10-25 NOTE — ED Notes (Signed)
Snack and drink given 

## 2021-10-25 NOTE — ED Notes (Signed)
Pt given snack. 

## 2021-10-25 NOTE — BH Assessment (Signed)
Referral checks;  ?  ?[facility] [phone]) ?  ?[facility] [phone]), Denied due to medical reasons ?  ?[facility] [phone]), ?  ?[place] [phone] or [phone]) ?  ?[facility] [phone]), No answer ?  ?[facility] [phone] -or- [phone]), No answer ?  ?[facility] [phone]) Facility unable to accept patient that are under IVC ?  ?[facility] [phone]). ?  ?[facility] [phone]) ?

## 2021-10-25 NOTE — ED Notes (Signed)
Report to include Situation, Background, Assessment, and Recommendations received from Amy RN. Patient alert and oriented, warm and dry, in no acute distress. Patient denies SI, HI, AVH and pain. Patient made aware of Q15 minute rounds and security cameras for their safety. Patient instructed to come to me with needs or concerns.  

## 2021-10-25 NOTE — ED Notes (Signed)
Pt given snack at this time  

## 2021-10-25 NOTE — BH Assessment (Signed)
Referral Checks: ?  ?[facility] [phone]) ?  ?[facility] [phone]), No answer ?  ?[facility] [phone]), Unable to reach intake staff ?  ?[place] [phone] or [phone]) Not accepting pts at this time ?  ?[facility] [phone]), No answer ?  ?[facility] [phone] -or- [phone]),  ?  ?[facility] [phone]) Not accepting pts under involuntary commitment ?  ?[facility] [phone]). No answer; this writer left a HIPPA compliant message requesting a call back. ?  ?[facility] [phone]) Per [place], Pt denied due to acuity. ?

## 2021-10-25 NOTE — ED Notes (Signed)
IVC/  PENDING  PLACEMENT 

## 2021-10-25 NOTE — ED Notes (Signed)
Patient agreeable to take a partial bed bath. Patient independently completed this task along with some verbal assistance by this tech. Patient changed into clean scrubs and patient cleaned up room. Snack given for good behavior.  ?

## 2021-10-25 NOTE — ED Notes (Signed)
Patient ate 100% of breakfast tray

## 2021-10-25 NOTE — ED Notes (Signed)
Pt given lunch tray and drink at this time. 

## 2021-10-25 NOTE — ED Notes (Signed)
Pt refusing to shower.  Pt told this Clinical research associate he showered yesterday. RN explained he had not showered because she was the nurse yesterday as well.  ?

## 2021-10-25 NOTE — ED Notes (Signed)
Pt given dinner tray and drink at this time. 

## 2021-10-25 NOTE — ED Notes (Signed)
IVC/Pending Placement 

## 2021-10-25 NOTE — ED Notes (Signed)
ED Provider at bedside. 

## 2021-10-26 ENCOUNTER — Other Ambulatory Visit: Payer: Self-pay

## 2021-10-26 LAB — CBG MONITORING, ED: Glucose-Capillary: 127 mg/dL — ABNORMAL HIGH (ref 70–99)

## 2021-10-26 NOTE — ED Notes (Signed)
Unlocked bathroom door to allow patient to shower.  Staff continuously monitored dayroom outside of bathroom door during pt shower.  Pt was given hygiene items as well as:  I clean top, 1 clean bottom, with 1 pair of disposable underwear.  Pt changed out into clean clothing.  Staff disposed of all shower supplies. Shower room cleaned and secured for next use.  

## 2021-10-26 NOTE — ED Notes (Signed)
Report received from Hewan, RN including SBAR. On initial round after report Pt is warm/dry, resting quietly in room without any s/s of distress.  Will continue to monitor throughout shift as ordered for any changes in behaviors and for continued safety.   ?

## 2021-10-26 NOTE — ED Notes (Signed)
IVC pending placement  IVC needs renewed 4/24 ?

## 2021-10-26 NOTE — BH Assessment (Signed)
Per charge nurse Silva Bandy., pt can be transferred to [facility] tomorrow 10/27/21.  ?

## 2021-10-26 NOTE — ED Notes (Signed)
Hospital meal provided, pt tolerated w/o complaints.  Waste discarded appropriately.  

## 2021-10-26 NOTE — ED Notes (Signed)
IVC pending placement 

## 2021-10-26 NOTE — ED Notes (Signed)
Report to include Situation, Background, Assessment, and Recommendations received from [facility]. Patient alert and oriented, warm and dry, in no acute distress. Patient denies SI, HI, AVH and pain. Patient appears to be having auditory hallucination. He was seen talking loud in his room with no one around. Patient made aware of Q15 minute rounds and security cameras for their safety. Patient instructed to come to me with needs or concerns. ? ?

## 2021-10-26 NOTE — ED Provider Notes (Signed)
Emergency Medicine Observation Re-evaluation Note ? ?Brandon [name] is a 31 y.o. male, seen on rounds today.  Pt initially presented to the ED for complaints of Psychiatric Evaluation ?Currently, the patient is sleeping. ? ?Physical Exam  ?BP (!) 142/88 (BP Location: Left Arm)   Pulse 75   Temp 98.3 ?F (36.8 ?C) (Oral)   Resp 19   Wt 131.5 kg   SpO2 100%   BMI 37.23 kg/m?  ?Physical Exam ?General: resting, no distress ?Lungs: no increased wob ?Psych: calm ? ?ED Course / MDM  ?EKG:  ? ?I have reviewed the labs performed to date as well as medications administered while in observation.  Recent changes in the last 24 hours include no change. ? ?Plan  ?Current plan is for inpatient psych placement. ?BROOKSTON MARSON is under involuntary commitment. ?  ? ?  ?Rada Hay, MD ?10/26/21 [phone] ? ?

## 2021-10-26 NOTE — BH Assessment (Signed)
Patient to be admitted to [facility] when bed becomes available. ?

## 2021-10-26 NOTE — ED Notes (Signed)
Hospital meal provided, pt tolerated w/o complaints.  Waste discarded appropriately;l 

## 2021-10-27 NOTE — ED Notes (Signed)
Breakfast tray and drink given.  

## 2021-10-27 NOTE — ED Notes (Signed)
Pt given shower supplies and currently in the shower at this time. ?

## 2021-10-27 NOTE — ED Notes (Signed)
Pt out of shower at this time 

## 2021-10-27 NOTE — ED Notes (Signed)
Hospital meal provided, pt tolerated w/o complaints.  Waste discarded appropriately.  

## 2021-10-27 NOTE — ED Notes (Signed)
Snack and beverage given. 

## 2021-10-27 NOTE — ED Provider Notes (Signed)
Emergency Medicine Observation Re-evaluation Note ? ?Brandon [name] is a 31 y.o. male, seen on rounds today.   ? ?Physical Exam  ?BP (!) 144/76 (BP Location: Left Wrist)   Pulse 91   Temp 98.5 ?F (36.9 ?C)   Resp 17   Ht 6\' 2"  (1.88 m)   Wt 131.5 kg   SpO2 98%   BMI 37.23 kg/m?  ?Physical Exam ?General: Patient resting comfortably in bed ?Lungs: Patient in no respiratory distress ?Psych: Patient not combative ? ?ED Course / MDM  ?EKG:  ? ? ?Plan  ?Current plan is for psychiatric evaluation and treatment. ?Brandon [name] is under involuntary commitment. ?  ? ?  ?[name], MD ?10/27/21 [phone] ? ?

## 2021-10-27 NOTE — ED Notes (Signed)
Notified that pt unable to currently transfer to BMU d/t a.m. staffing shortage & medical emergency this afternoon.  BMU staff stated that if pt cannot transfer on day shift to transfer tonight 10/27/21 ?

## 2021-10-27 NOTE — ED Notes (Signed)
Report received from Hewan, RN including SBAR. On initial round after report Brandon [name] is warm/dry, resting quietly in room without any s/s of distress.  Will continue to monitor throughout shift as ordered for any changes in behaviors and for continued safety.   ?

## 2021-10-27 NOTE — ED Notes (Signed)
Patient provided snack at appropriate snack time.  Pt consumed 100% of snack provided, tolerated well w/o complaints   Trash disposted of appropriately by patient.  

## 2021-10-27 NOTE — ED Notes (Signed)
Report to include Situation, Background, Assessment, and Recommendations received from Katie RN. Patient alert and oriented, warm and dry, in no acute distress. Patient denies SI, HI, AVH and pain. Patient made aware of Q15 minute rounds and security cameras for their safety. Patient instructed to come to me with needs or concerns.  

## 2021-10-28 ENCOUNTER — Other Ambulatory Visit: Payer: Self-pay

## 2021-10-28 ENCOUNTER — Inpatient Hospital Stay
Admission: AD | Admit: 2021-10-28 | Discharge: 2021-11-01 | DRG: 885 | Disposition: A | Discharge status: Home / Self Care | Payer: Medicare Other | Source: Intra-hospital | Attending: Psychiatry | Admitting: Psychiatry

## 2021-10-28 ENCOUNTER — Encounter: Payer: Self-pay | Admitting: Psychiatry

## 2021-10-28 DIAGNOSIS — F17210 Nicotine dependence, cigarettes, uncomplicated: Secondary | ICD-10-CM | POA: Diagnosis present

## 2021-10-28 DIAGNOSIS — F201 Disorganized schizophrenia: Principal | ICD-10-CM | POA: Diagnosis present

## 2021-10-28 DIAGNOSIS — Z818 Family history of other mental and behavioral disorders: Secondary | ICD-10-CM

## 2021-10-28 DIAGNOSIS — Z79899 Other long term (current) drug therapy: Secondary | ICD-10-CM

## 2021-10-28 DIAGNOSIS — I10 Essential (primary) hypertension: Secondary | ICD-10-CM | POA: Diagnosis present

## 2021-10-28 DIAGNOSIS — Z20822 Contact with and (suspected) exposure to covid-19: Secondary | ICD-10-CM | POA: Diagnosis present

## 2021-10-28 DIAGNOSIS — Z8673 Personal history of transient ischemic attack (TIA), and cerebral infarction without residual deficits: Secondary | ICD-10-CM | POA: Diagnosis not present

## 2021-10-28 DIAGNOSIS — Z7901 Long term (current) use of anticoagulants: Secondary | ICD-10-CM | POA: Diagnosis not present

## 2021-10-28 DIAGNOSIS — G35 Multiple sclerosis: Secondary | ICD-10-CM | POA: Diagnosis present

## 2021-10-28 DIAGNOSIS — H409 Unspecified glaucoma: Secondary | ICD-10-CM | POA: Diagnosis present

## 2021-10-28 DIAGNOSIS — Z6841 Body Mass Index (BMI) 40.0 and over, adult: Secondary | ICD-10-CM

## 2021-10-28 DIAGNOSIS — E66813 Obesity, class 3: Secondary | ICD-10-CM | POA: Diagnosis present

## 2021-10-28 LAB — RESP PANEL BY RT-PCR (FLU A&B, COVID) ARPGX2
Influenza A by PCR: NEGATIVE
Influenza B by PCR: NEGATIVE
SARS Coronavirus 2 by RT PCR: NEGATIVE

## 2021-10-28 MED ORDER — TOPIRAMATE 25 MG PO TABS
25.0000 mg | ORAL_TABLET | Freq: Two times a day (BID) | ORAL | Status: DC
  Administered 2021-10-28 – 2021-11-01 (×8): 25 mg via ORAL
  Filled 2021-10-28 (×8): qty 1

## 2021-10-28 MED ORDER — PAROXETINE HCL 10 MG PO TABS: 10.0000 mg | ORAL_TABLET | Freq: Every day | ORAL | Status: DC

## 2021-10-28 MED ORDER — NICOTINE 14 MG/24HR TD PT24
14.0000 mg | MEDICATED_PATCH | Freq: Every day | TRANSDERMAL | Status: DC
  Administered 2021-10-28 – 2021-11-01 (×5): 14 mg via TRANSDERMAL
  Filled 2021-10-28 (×5): qty 1

## 2021-10-28 MED ORDER — ASENAPINE MALEATE 5 MG SL SUBL
10.0000 mg | SUBLINGUAL_TABLET | Freq: Every day | SUBLINGUAL | Status: DC
  Filled 2021-10-28: qty 2

## 2021-10-28 MED ORDER — OLANZAPINE 10 MG PO TABS
10.0000 mg | ORAL_TABLET | Freq: Every day | ORAL | Status: DC
  Administered 2021-10-28: 10 mg via ORAL
  Filled 2021-10-28: qty 1

## 2021-10-28 MED ORDER — MAGNESIUM HYDROXIDE 400 MG/5ML PO SUSP
30.0000 mL | Freq: Every day | ORAL | Status: DC | PRN
  Administered 2021-10-31: 30 mL via ORAL
  Filled 2021-10-28: qty 30

## 2021-10-28 MED ORDER — METFORMIN HCL ER 500 MG PO TB24: 500.0000 mg | ORAL_TABLET | Freq: Two times a day (BID) | ORAL | Status: DC

## 2021-10-28 MED ORDER — HYDROXYZINE HCL 25 MG PO TABS: 25.0000 mg | ORAL_TABLET | Freq: Three times a day (TID) | ORAL | Status: DC

## 2021-10-28 MED ORDER — AMLODIPINE BESYLATE 5 MG PO TABS
5.0000 mg | ORAL_TABLET | Freq: Every day | ORAL | Status: DC
  Administered 2021-10-29: 5 mg via ORAL
  Filled 2021-10-28: qty 1

## 2021-10-28 MED ORDER — AMANTADINE HCL 100 MG PO CAPS
100.0000 mg | ORAL_CAPSULE | Freq: Two times a day (BID) | ORAL | Status: DC
  Filled 2021-10-28: qty 1

## 2021-10-28 MED ORDER — APIXABAN 5 MG PO TABS
5.0000 mg | ORAL_TABLET | Freq: Two times a day (BID) | ORAL | Status: DC
  Administered 2021-10-28 – 2021-11-01 (×8): 5 mg via ORAL
  Filled 2021-10-28 (×8): qty 1

## 2021-10-28 MED ORDER — ALUM & MAG HYDROXIDE-SIMETH 200-200-20 MG/5ML PO SUSP: 30.0000 mL | ORAL | Status: DC | PRN

## 2021-10-28 MED ORDER — ARIPIPRAZOLE 10 MG PO TABS: 20.0000 mg | ORAL_TABLET | Freq: Every evening | ORAL | Status: DC | PRN

## 2021-10-28 MED ORDER — TEMAZEPAM 15 MG PO CAPS
15.0000 mg | ORAL_CAPSULE | Freq: Every day | ORAL | Status: DC
  Administered 2021-10-28 – 2021-10-31 (×4): 15 mg via ORAL
  Filled 2021-10-28 (×4): qty 1

## 2021-10-28 MED ORDER — LORAZEPAM 1 MG PO TABS: 1.0000 mg | ORAL_TABLET | Freq: Two times a day (BID) | ORAL | Status: DC

## 2021-10-28 MED ORDER — ACETAMINOPHEN 325 MG PO TABS
650.0000 mg | ORAL_TABLET | Freq: Four times a day (QID) | ORAL | Status: DC | PRN
  Administered 2021-10-30: 650 mg via ORAL
  Filled 2021-10-28: qty 2

## 2021-10-28 MED ORDER — HALOPERIDOL 5 MG PO TABS
10.0000 mg | ORAL_TABLET | Freq: Two times a day (BID) | ORAL | Status: DC
  Administered 2021-10-28 – 2021-11-01 (×8): 10 mg via ORAL
  Filled 2021-10-28 (×8): qty 2

## 2021-10-28 MED ORDER — LOSARTAN POTASSIUM 50 MG PO TABS
50.0000 mg | ORAL_TABLET | Freq: Two times a day (BID) | ORAL | Status: DC
  Administered 2021-10-28 – 2021-11-01 (×8): 50 mg via ORAL
  Filled 2021-10-28 (×9): qty 1

## 2021-10-28 MED ORDER — BENZTROPINE MESYLATE 1 MG PO TABS
1.0000 mg | ORAL_TABLET | Freq: Two times a day (BID) | ORAL | Status: DC
  Administered 2021-10-28 – 2021-11-01 (×8): 1 mg via ORAL
  Filled 2021-10-28 (×8): qty 1

## 2021-10-28 MED ORDER — LORATADINE 10 MG PO TABS
10.0000 mg | ORAL_TABLET | Freq: Every day | ORAL | Status: DC | PRN
Start: 2021-10-28 — End: 2021-11-01

## 2021-10-28 NOTE — ED Provider Notes (Signed)
Emergency Medicine Observation Re-evaluation Note ? ?Brandon [name] is a 31 y.o. male, seen on rounds today.  Pt initially presented to the ED for complaints of Psychiatric Evaluation ?Currently, the patient is resting comfortably. ? ?Physical Exam  ?BP 131/86 (BP Location: Left Arm)   Pulse 78   Temp 98.5 ?F (36.9 ?C)   Resp 20   Ht 6\' 2"  (1.88 m)   Wt 131.5 kg   SpO2 97%   BMI 37.23 kg/m?  ?Physical Exam ?Constitutional:   ?   Appearance: He is not ill-appearing or toxic-appearing.  ?Pulmonary:  ?   Effort: Pulmonary effort is normal.  ?Musculoskeletal:     ?   General: No deformity.  ?Psychiatric:  ?   Comments: No emotional distress  ? ? ? ?ED Course / MDM  ?EKG:  ? ?I have reviewed the labs performed to date as well as medications administered while in observation.  Recent changes in the last 24 hours include none. ? ?Plan  ?Current plan is for placement. ?Brandon [name] is under involuntary commitment. ?  ? ?  ?Vladimir Crofts, MD ?10/28/21 [phone] ? ?

## 2021-10-28 NOTE — Tx Team (Signed)
Initial Treatment Plan ?10/28/2021 ?1:26 PM ?Brandon [name] ?DTO:[id number] ? ? ? ?PATIENT STRESSORS: ?Health problems   ?Traumatic event   ? ? ?PATIENT STRENGTHS: ?Communication skills  ?Motivation for treatment/growth  ?Supportive family/friends  ? ? ?PATIENT IDENTIFIED PROBLEMS: ?Anhedonia  ?Depression  ?  ?  ?  ?  ?  ?  ?  ?  ? ?DISCHARGE CRITERIA:  ?Adequate post-discharge living arrangements ?Medical problems require only outpatient monitoring ?Verbal commitment to aftercare and medication compliance ? ?PRELIMINARY DISCHARGE PLAN: ?Attend aftercare/continuing care group ?Return to previous living arrangement ? ?PATIENT/FAMILY INVOLVEMENT: ?This treatment plan has been presented to and reviewed with the patient, Brandon [name], and/or family member, The patient and family have been given the opportunity to ask questions and make suggestions. ? ?Leonarda Salon, RN ?10/28/2021, 1:26 PM ?

## 2021-10-28 NOTE — ED Notes (Signed)
IVC/pending placement/paperwork to be renewed today by 7PM. ?

## 2021-10-28 NOTE — Progress Notes (Signed)
Patient presents as responding to internal stimuli. Patient redirectable. Patient observed interacting appropriately with staff and peers on the unit. Pt compliant with medication administration per MD orders. Pt given education, support, and encouragement to be active in his treatment plan. Pt being monitored Q 15 minutes for safety per unit protocol. Pt remains safe on the unit.  ?

## 2021-10-28 NOTE — ED Notes (Signed)
Pt given breakfast tray

## 2021-10-28 NOTE — ED Notes (Signed)
Pt discharged to BMU under IVC.  Belongings sent with patient. Pt cooperative.  

## 2021-10-28 NOTE — BH Assessment (Signed)
Patient is to be admitted to [facility] today 10/28/21 by Dr. Toni Amend.  ?Attending Physician will be Dr.  Toni Amend .   ?Patient has been assigned to room 301, by [facility] Charge Nurse, Maryelizabeth Kaufmann.   ? ?ER staff is aware of the admission: ?Misty Stanley, ER Secretary   ?Dr. Deneen Harts, ER MD  ?Amy, Patient's Nurse  ?Sue Lush, Patient Access.  ?

## 2021-10-28 NOTE — Progress Notes (Signed)
Brandon [name] a 31 years old male patient admitted from ED. Patient cooperative on admission assessment.Patient sleepy and slow to respond. Unable to have a conversation with patient. Patient denies SI,HI and AVH. Patient slightly limping on his left side. Patient verbalized mild weakness on his left side. Patient independent with ADLs. Skin assessment and body search done with [facility] RN. No contraband found. Oriented to unit and lunch provided. Support and encouragement given. ?

## 2021-10-28 NOTE — ED Notes (Signed)
Snack and drink given 

## 2021-10-28 NOTE — [facility] Suicide Risk Assessment (Signed)
[facility] Admission Suicide Risk Assessment ? ? ?Nursing information obtained from:  Patient ?Demographic factors:  Male, Unemployed ?Current Mental Status:  NA ?Loss Factors:  NA ?Historical Factors:  NA ?Risk Reduction Factors:  NA ? 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  Obesity, Class III, BMI 40-49.9 (morbid obesity) ([place]) ?  HTN (hypertension) ?  Multiple sclerosis ([place]) ? ?Subjective Data: Patient seen and chart reviewed.  31 year old man with history of schizophrenia was admitted from the emergency room where he has been waiting for several days.  Patient came in under IVC with allegations of aggression and violence and threatening behavior at his group home.  No report of suicidal ideation or suicidal behavior.  Patient is currently calm and resting denies any suicidal or homicidal ideation. ? ?Continued Clinical Symptoms:  ?Alcohol Use Disorder Identification Test Final Score (AUDIT): 2 ?The "Alcohol Use Disorders Identification Test", Guidelines for Use in Primary Care, Second Edition.  World Pharmacologist [facility]). ?Score between 0-7:  no or low risk or alcohol related problems. ?Score between 8-15:  moderate risk of alcohol related problems. ?Score between 16-19:  high risk of alcohol related problems. ?Score 20 or above:  warrants further diagnostic evaluation for alcohol dependence and treatment. ? ? ?CLINICAL FACTORS:  ? Schizophrenia:   Less than 30 years old ? ? ?Musculoskeletal: ?Strength & Muscle Tone: within normal limits ?Gait & Station: normal ?Patient leans: N/A ? ?Psychiatric Specialty Exam: ? ?Presentation  ?General Appearance: Appropriate for Environment ? ?Eye Contact:Good ? ?Speech:Clear and Coherent ? ?Speech Volume:Normal ? ?Handedness:Right ? ? ?Mood and Affect  ?Mood:Dysphoric ? ?Affect:Congruent (somewhat odd) ? ? ?Thought Process   ?Thought Processes:Disorganized (able to answer some questions appropriately) ? ?Descriptions of Associations:Loose ? ?Orientation:Partial ? ?Thought Content:Other (comment) (able to answer some questions appropriately) ? ?History of Schizophrenia/Schizoaffective disorder:Yes ? ?Duration of Psychotic Symptoms:Greater than six months ? ?Hallucinations:No data recorded ?Ideas of Reference:None ? ?Suicidal Thoughts:No data recorded ?Homicidal Thoughts:No data recorded ? ?Sensorium  ?Memory:Recent Fair; Immediate Fair ? ?Judgment:Impaired ? ?Insight:Lacking ? ? ?Executive Functions  ?Concentration:Poor ? ?Attention Span:Poor ? ?Recall:Poor ? ?Fund of Knowledge:Poor ? ?Language:Poor ? ? ?Psychomotor Activity  ?Psychomotor Activity:No data recorded ? ?Assets  ?Assets:Resilience; Physical Health; Social Support ? ? ?Sleep  ?Sleep:No data recorded ? ? ?Physical Exam: ?Physical Exam ?Vitals and nursing note reviewed.  ?Constitutional:   ?   Appearance: Normal appearance.  ?HENT:  ?   Head: Normocephalic and atraumatic.  ?   Mouth/Throat:  ?   Pharynx: Oropharynx is clear.  ?Eyes:  ?   Pupils: Pupils are equal, round, and reactive to light.  ?Cardiovascular:  ?   Rate and Rhythm: Normal rate and regular rhythm.  ?Pulmonary:  ?   Effort: Pulmonary effort is normal.  ?   Breath sounds: Normal breath sounds.  ?Abdominal:  ?   General: Abdomen is flat.  ?   Palpations: Abdomen is soft.  ?Musculoskeletal:     ?   General: Normal range of motion.  ?Skin: ?   General: Skin is warm and dry.  ?Neurological:  ?   General: No focal deficit present.  ?   Mental Status: He is alert. Mental status is at baseline.  ?Psychiatric:     ?   Attention and Perception: He is inattentive.     ?   Mood and Affect: Mood normal. Affect is blunt.     ?  Speech: Speech is delayed.     ?   Behavior: Behavior is slowed.     ?   Thought Content: Thought content normal.     ?   Cognition and Memory: Cognition is impaired.  ? ?Review of Systems   ?Constitutional:  Positive for malaise/fatigue.  ?HENT: Negative.    ?Eyes: Negative.   ?Respiratory: Negative.    ?Cardiovascular: Negative.   ?Gastrointestinal: Negative.   ?Musculoskeletal: Negative.   ?Skin: Negative.   ?Neurological: Negative.   ?Psychiatric/Behavioral: Negative.    ?Blood pressure 127/80, pulse 82, temperature 97.7 ?F (36.5 ?C), temperature source Oral, resp. rate 16, height 6\' 2"  (1.88 m), weight (!) 142.9 kg, SpO2 99 %. Body mass index is 40.44 kg/m?. ? ? ?COGNITIVE FEATURES THAT CONTRIBUTE TO RISK:  ?None   ? ?SUICIDE RISK:  ? Minimal: No identifiable suicidal ideation.  Patients presenting with no risk factors but with morbid ruminations; may be classified as minimal risk based on the severity of the depressive symptoms ? ?PLAN OF CARE: Continue 15-minute checks.  Address medication for appropriateness for treating schizophrenia and chronic behavior problems.  Involve in individual and group therapy.  Full assessment by treatment team.  Ongoing assessment of dangerousness prior to discharge ? ?I certify that inpatient services furnished can reasonably be expected to improve the patient's condition.  ? ?Alethia Berthold, MD ?10/28/2021, 2:43 PM ? ?

## 2021-10-28 NOTE — H&P (Signed)
Psychiatric Admission Assessment Adult ? ?Patient Identification: Brandon [name] ?MRN:  [id number] ?Date of Evaluation:  10/28/2021 ?Chief Complaint:  Schizophrenia, disorganized type (HCC) [F20.1] ?Principal Diagnosis: Schizophrenia, disorganized type (HCC) ?Diagnosis:  Principal Problem: ?  Schizophrenia, disorganized type (HCC) ?Active Problems: ?  Obesity, Class III, BMI 40-49.9 (morbid obesity) (HCC) ?  HTN (hypertension) ?  Multiple sclerosis (HCC) ? ?History of Present Illness: Patient seen and chart reviewed.  31 year old man with a history of schizophrenia and has been in our emergency room for several days.  He was initially brought in from his group home under IVC that alleges that he had been aggressive and violent and threatening.  In the emergency room he was initially disorganized but has calmed down and become more cooperative.  Patient is able to provide only minimal information today as he is very sleepy.  Denies suicidal or homicidal thought.  Denies current hallucinations.  I spoke with his mother who is his legal guardian.  She reports that things she is most noticing is that if he does not get enough sleep at night he becomes aggressive.  She strongly recommends that that be a focus of his treatment.  She is also concerned that he needs to be in a group home "at a higher level".  Apparently he has only been at his current group home for about a month after a lifetime of living with family.  No evidence of alcohol or drug abuse currently. ?Associated Signs/Symptoms: ?Depression Symptoms:  hypersomnia, ?anxiety, ?Duration of Depression Symptoms: Greater than two weeks ? ?(Hypo) Manic Symptoms:  Elevated Mood, ?Impulsivity, ?Irritable Mood, ?Anxiety Symptoms:  Panic Symptoms, ?Psychotic Symptoms:  Paranoia, ?PTSD Symptoms: ?Negative ?Total Time spent with patient: 1 hour ? ?Past Psychiatric History: Patient has a past history of a diagnosis of schizophrenia.  Also has multiple sclerosis.  Looking  back over his chart has had multiple hospitalizations and multiple emergency room visits.  His medications have been changed multiple times over the past several months.  He is currently getting outpatient treatment with Dr. Mervyn Skeeters I think through [facility] or wherever Dr. Rosalia Hammers is currently practicing.  Looks like the patient has been on clozapine but according to his mother that does not seem to suit him.  He does have a history of some aggression when psychotic but no history of suicidal behavior.  He has a history of occasional alcohol misuse. ? ?Is the patient at risk to self? Yes.    ?Has the patient been a risk to self in the past 6 months? Yes.    ?Has the patient been a risk to self within the distant past? Yes.    ?Is the patient a risk to others? Yes.    ?Has the patient been a risk to others in the past 6 months? Yes.    ?Has the patient been a risk to others within the distant past? Yes.    ? ?Prior Inpatient Therapy:   ?Prior Outpatient Therapy:   ? ?Alcohol Screening: Patient refused Alcohol Screening Tool: Yes ?1. How often do you have a drink containing alcohol?: 2 to 4 times a month ?2. How many drinks containing alcohol do you have on a typical day when you are drinking?: 1 or 2 ?3. How often do you have six or more drinks on one occasion?: Never ?AUDIT-C Score: 2 ?4. How often during the last year have you found that you were not able to stop drinking once you had started?: Never ?5. How often during  the last year have you failed to do what was normally expected from you because of drinking?: Never ?6. How often during the last year have you needed a first drink in the morning to get yourself going after a heavy drinking session?: Never ?7. How often during the last year have you had a feeling of guilt of remorse after drinking?: Never ?8. How often during the last year have you been unable to remember what happened the night before because you had been drinking?: Never ?9. Have you or someone else been  injured as a result of your drinking?: No ?10. Has a relative or friend or a doctor or another health worker been concerned about your drinking or suggested you cut down?: No ?Alcohol Use Disorder Identification Test Final Score (AUDIT): 2 ?Substance Abuse History in the last 12 months:  Yes.   ?Consequences of Substance Abuse: ?Alcohol abuse complicating abilities to move into a group home and stay stable ?Previous Psychotropic Medications: Yes  ?Psychological Evaluations: Yes  ?Past Medical History:  ?Past Medical History:  ?Diagnosis Date  ? ADHD (attention deficit hyperactivity disorder)   ? Bipolar 1 disorder (HCC)   ? Chronic back pain   ? Chronic constipation   ? Chronic neck pain   ? Hypertension   ? Multiple sclerosis (HCC) 05/20/2013  ? left sided weakness, dysarthria  ? Non-compliance   ? Obesity   ? Pulmonary embolism (HCC)   ? Schizophrenia (HCC)   ? Stroke [facility])   ? left sided deficits - pt's mother denies this  ?  ?Past Surgical History:  ?Procedure Laterality Date  ? NO PAST SURGERIES    ? None    ? RADIOLOGY WITH ANESTHESIA N/A 01/16/2021  ? Procedure: MRI WITH ANESTHESIA CERVICAL AND THORASIC SPINE WITH AND WITHOUT CONTRAST;  Surgeon: Radiologist, Medication, MD;  Location: [facility] OR;  Service: Radiology;  Laterality: N/A;  ? TOOTH EXTRACTION N/A 06/24/2019  ? Procedure: DENTAL RESTORATION/EXTRACTION OF TEETH NUMBER ONE, SIXTEEN, SEVENTEEN, NINETEEN, THIRTY-TWO;  Surgeon: Ocie Doyne, DDS;  Location: [facility] OR;  Service: Oral Surgery;  Laterality: N/A;  ? ?Family History:  ?Family History  ?Problem Relation Age of Onset  ? Diabetes Mother   ? ADD / ADHD Brother   ? ?Family Psychiatric  History: ADHD in a brother ?Tobacco Screening: Have you used any form of tobacco in the last 30 days? (Cigarettes, Smokeless Tobacco, Cigars, and/or Pipes): Yes ?Tobacco use, Select all that apply: 4 or less cigarettes per day ?Are you interested in Tobacco Cessation Medications?: Yes, will notify MD for an order ?Counseled  patient on smoking cessation including recognizing danger situations, developing coping skills and basic information about quitting provided: Yes ?Social History:  ?Social History  ? ?Substance and Sexual Activity  ?Alcohol Use Not Currently  ? Alcohol/week: 0.0 standard drinks  ? Comment: "A little bit"   ?   ?Social History  ? ?Substance and Sexual Activity  ?Drug Use Not Currently  ? Types: Marijuana  ? Comment: Last used: unknown   ?  ?Additional Social History: ?  ?   ?  ?  ?  ?  ?  ?  ?  ?  ?  ?  ? ?Allergies:  No Known Allergies ?Lab Results:  ?Results for orders placed or performed during the hospital encounter of 10/21/21 (from the past 48 hour(s))  ?CBG monitoring, ED     Status: Abnormal  ? Collection Time: 10/26/21  5:44 PM  ?Result Value Ref Range  ?  Glucose-Capillary 127 (H) 70 - 99 mg/dL  ?  Comment: Glucose reference range applies only to samples taken after fasting for at least 8 hours.  ?Resp Panel by RT-PCR (Flu A&B, Covid) Nasopharyngeal Swab     Status: None  ? Collection Time: 10/28/21  8:23 AM  ? Specimen: Nasopharyngeal Swab; Nasopharyngeal(NP) swabs in vial transport medium  ?Result Value Ref Range  ? SARS Coronavirus 2 by RT PCR NEGATIVE NEGATIVE  ?  Comment: (NOTE) ?SARS-CoV-2 target nucleic acids are NOT DETECTED. ? ?The SARS-CoV-2 RNA is generally detectable in upper respiratory ?specimens during the acute phase of infection. The lowest ?concentration of SARS-CoV-2 viral copies this assay can detect is ?138 copies/mL. A negative result does not preclude SARS-Cov-2 ?infection and should not be used as the sole basis for treatment or ?other patient management decisions. A negative result may occur with  ?improper specimen collection/handling, submission of specimen other ?than nasopharyngeal swab, presence of viral mutation(s) within the ?areas targeted by this assay, and inadequate number of viral ?copies(<138 copies/mL). A negative result must be combined with ?clinical observations,  patient history, and epidemiological ?information. The expected result is Negative. ? ?Fact Sheet for Patients:  ?BloggerCourse.com ? ?Fact Sheet for Healthcare Providers:  ?http

## 2021-10-29 ENCOUNTER — Telehealth: Payer: Self-pay

## 2021-10-29 DIAGNOSIS — F201 Disorganized schizophrenia: Secondary | ICD-10-CM | POA: Diagnosis not present

## 2021-10-29 LAB — LIPID PANEL
Cholesterol: 152 mg/dL (ref 0–200)
HDL: 36 mg/dL — ABNORMAL LOW (ref >40)
LDL Cholesterol: 77 mg/dL (ref 0–99)
Total CHOL/HDL Ratio: 4.2 RATIO
Triglycerides: 196 mg/dL — ABNORMAL HIGH (ref <150)
VLDL: 39 mg/dL (ref 0–40)

## 2021-10-29 LAB — HEMOGLOBIN A1C
Hgb A1c MFr Bld: 5.6 % (ref 4.8–5.6)
Mean Plasma Glucose: 114.02 mg/dL

## 2021-10-29 MED ORDER — HALOPERIDOL 5 MG PO TABS
5.0000 mg | ORAL_TABLET | Freq: Four times a day (QID) | ORAL | Status: DC | PRN
  Administered 2021-10-31: 5 mg via ORAL
  Filled 2021-10-29 (×2): qty 1

## 2021-10-29 MED ORDER — DIVALPROEX SODIUM 500 MG PO DR TAB
500.0000 mg | DELAYED_RELEASE_TABLET | Freq: Two times a day (BID) | ORAL | Status: DC
  Administered 2021-10-29 – 2021-11-01 (×6): 500 mg via ORAL
  Filled 2021-10-29 (×6): qty 1

## 2021-10-29 MED ORDER — DOCUSATE SODIUM 100 MG PO CAPS
100.0000 mg | ORAL_CAPSULE | Freq: Two times a day (BID) | ORAL | Status: DC
Start: 2021-10-29 — End: 2021-11-01
  Administered 2021-10-29 – 2021-11-01 (×6): 100 mg via ORAL
  Filled 2021-10-29 (×6): qty 1

## 2021-10-29 MED ORDER — LATANOPROST 0.005 % OP SOLN
1.0000 [drp] | Freq: Every day | OPHTHALMIC | Status: DC
Start: 2021-10-29 — End: 2021-11-01
  Administered 2021-10-30 – 2021-10-31 (×2): 1 [drp] via OPHTHALMIC
  Filled 2021-10-29: qty 2.5

## 2021-10-29 MED ORDER — HALOPERIDOL LACTATE 5 MG/ML IJ SOLN
5.0000 mg | Freq: Four times a day (QID) | INTRAMUSCULAR | Status: DC | PRN
  Administered 2021-10-30 (×2): 5 mg via INTRAMUSCULAR
  Filled 2021-10-29 (×2): qty 1

## 2021-10-29 MED ORDER — AMLODIPINE BESYLATE 5 MG PO TABS
10.0000 mg | ORAL_TABLET | Freq: Every day | ORAL | Status: DC
  Administered 2021-10-30 – 2021-11-01 (×3): 10 mg via ORAL
  Filled 2021-10-29 (×3): qty 2

## 2021-10-29 MED ORDER — OLANZAPINE 5 MG PO TABS
15.0000 mg | ORAL_TABLET | Freq: Every day | ORAL | Status: DC
  Administered 2021-10-29 – 2021-10-31 (×3): 15 mg via ORAL
  Filled 2021-10-29 (×3): qty 1

## 2021-10-29 NOTE — Telephone Encounter (Signed)
Completed PA for Ocrevus 300mg  IV q 6 months via CMM. Sent to [facility]. Key: [id number]. Should have a determination with 3-5 business days. ?

## 2021-10-29 NOTE — Progress Notes (Signed)
Patient ID: Brandon [name], male   DOB: 02-02-1991, 31 y.o.   MRN: [id number] ?Shouting and vocalizing at top volume all afternoon. Will add depakote to medication. ? ?

## 2021-10-29 NOTE — Progress Notes (Signed)
[facility] MD Progress Note ? ?10/29/2021 11:56 AM ?Brandon [name]  ?MRN:  [id number] ?Subjective: Follow-up 31 year old man with a history of chronic mental illness probably schizophrenia as well as a history of multiple sclerosis.  He was awake today and able to sit down and participate in an interview but looks sleepy much of the time.  Multiple episodes while we were talking that he would look like he was about to fall asleep or losing his attention but he would deny having any hallucinations or being aware of anything out of the ordinary.  Patient is not threatening and denies any suicidal or homicidal thought.  He remembers a little bit about his medicine but nothing specific enough to really guide treatment.  Patient is already showing some of the behaviors that I suspect have been a chronic issue.  Out in the hallway of the unit he will sometimes start yelling or making strange noises for no clear reason.  When asked about it he would just say that he is happy.  Look back over his old chart and again he has been on so many different medicines it is very hard to tell which if any of them have been most helpful. ?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[place]) ?Diagnosis: Principal Problem: ?  Schizophrenia, disorganized type ([place]) ?Active Problems: ?  Obesity, Class III, BMI 40-49.9 (morbid obesity) ([place]) ?  HTN (hypertension) ?  Multiple sclerosis ([place]) ? ?Total Time spent with patient: 30 minutes ? ?Past Psychiatric History: Long history of psychiatric symptoms and illness going back to adolescence which predates the diagnosis of multiple sclerosis in his early 37s.  Multiple medications have been tried. ? ?Past Medical History:  ?Past Medical History:  ?Diagnosis Date  ? ADHD (attention deficit hyperactivity disorder)   ? Bipolar 1 disorder ([place])   ? Chronic back pain   ? Chronic constipation   ? Chronic neck pain   ? Hypertension   ? Multiple sclerosis ([place]) 05/20/2013  ? left sided weakness,  dysarthria  ? Non-compliance   ? Obesity   ? Pulmonary embolism ([place])   ? Schizophrenia ([place])   ? Stroke [facility])   ? left sided deficits - pt's mother denies this  ?  ?Past Surgical History:  ?Procedure Laterality Date  ? NO PAST SURGERIES    ? None    ? RADIOLOGY WITH ANESTHESIA N/A 01/16/2021  ? Procedure: MRI WITH ANESTHESIA CERVICAL AND THORASIC SPINE WITH AND WITHOUT CONTRAST;  Surgeon: Radiologist, Medication, MD;  Location: [place];  Service: Radiology;  Laterality: N/A;  ? TOOTH EXTRACTION N/A 06/24/2019  ? Procedure: DENTAL RESTORATION/EXTRACTION OF TEETH NUMBER ONE, SIXTEEN, SEVENTEEN, NINETEEN, THIRTY-TWO;  Surgeon: Diona Browner, DDS;  Location: [place];  Service: Oral Surgery;  Laterality: N/A;  ? ?Family History:  ?Family History  ?Problem Relation Age of Onset  ? Diabetes Mother   ? ADD / ADHD Brother   ? ?Family Psychiatric  History: None reported ?Social History:  ?Social History  ? ?Substance and Sexual Activity  ?Alcohol Use Not Currently  ? Alcohol/week: 0.0 standard drinks  ? Comment: "A little bit"   ?   ?Social History  ? ?Substance and Sexual Activity  ?Drug Use Not Currently  ? Types: Marijuana  ? Comment: Last used: unknown   ?  ?Social History  ? ?Socioeconomic History  ? Marital status: Single  ?  Spouse name: Not on file  ? Number of children: 0  ? Years of education: 11th  ? Highest education level:  Not on file  ?Occupational History  ? Occupation: unemployed  ?  Employer: [employer]  ?  Comment: Disbaled  ?Tobacco Use  ? Smoking status: Every Day  ?  Packs/day: 0.25  ?  Types: Cigarettes  ? Smokeless tobacco: Never  ? Tobacco comments:  ?  2 cigarettes a day  ?Vaping Use  ? Vaping Use: Never used  ?Substance and Sexual Activity  ? Alcohol use: Not Currently  ?  Alcohol/week: 0.0 standard drinks  ?  Comment: "A little bit"   ? Drug use: Not Currently  ?  Types: Marijuana  ?  Comment: Last used: unknown   ? Sexual activity: Not on file  ?Other Topics Concern  ? Not on file  ?Social History  Narrative  ? Patient lives at home with his mother.  ? Disabled.  ? Education 11 th grade .  ? Right handed.  ? Drinks caffeine occassionally  ? ?Social Determinants of Health  ? ?Financial Resource Strain: Not on file  ?Food Insecurity: Not on file  ?Transportation Needs: Not on file  ?Physical Activity: Not on file  ?Stress: Not on file  ?Social Connections: Not on file  ? ?Additional Social History:  ?  ?  ?  ?  ?  ?  ?  ?  ?  ?  ?  ? ?Sleep: Fair ? ?Appetite:  Good ? ?Current Medications: ?Current Facility-Administered Medications  ?Medication Dose Route Frequency Provider Last Rate Last Admin  ? acetaminophen (TYLENOL) tablet 650 mg  650 mg Oral Q6H PRN Patrecia Pour, NP      ? alum & mag hydroxide-simeth (MAALOX/MYLANTA) 200-200-20 MG/5ML suspension 30 mL  30 mL Oral Q4H PRN Patrecia Pour, NP      ? Derrill Memo ON 10/30/2021] amLODipine (NORVASC) tablet 10 mg  10 mg Oral Daily Lyell Clugston, Madie Reno, MD      ? apixaban Arne Cleveland) tablet 5 mg  5 mg Oral BID Patrecia Pour, NP   5 mg at 10/29/21 0844  ? benztropine (COGENTIN) tablet 1 mg  1 mg Oral BID Demarr Kluever T, MD   1 mg at 10/29/21 [id number]  ? haloperidol (HALDOL) tablet 10 mg  10 mg Oral BID Patrecia Pour, NP   10 mg at 10/29/21 [id number]  ? haloperidol (HALDOL) tablet 5 mg  5 mg Oral Q6H PRN Letia Guidry, Madie Reno, MD      ? Or  ? haloperidol lactate (HALDOL) injection 5 mg  5 mg Intramuscular Q6H PRN Agustine Rossitto, Madie Reno, MD      ? loratadine (CLARITIN) tablet 10 mg  10 mg Oral Daily PRN Patrecia Pour, NP      ? losartan (COZAAR) tablet 50 mg  50 mg Oral BID Patrecia Pour, NP   50 mg at 10/29/21 [id number]  ? magnesium hydroxide (MILK OF MAGNESIA) suspension 30 mL  30 mL Oral Daily PRN Patrecia Pour, NP      ? nicotine (NICODERM CQ - dosed in mg/24 hours) patch 14 mg  14 mg Transdermal Daily Aryon Nham, Madie Reno, MD   14 mg at 10/29/21 0846  ? OLANZapine (ZYPREXA) tablet 15 mg  15 mg Oral QHS Xaviar Lunn T, MD      ? temazepam (RESTORIL) capsule 15 mg  15 mg Oral QHS Fatime Biswell, Madie Reno, MD   15 mg at 10/28/21 2126  ? topiramate (TOPAMAX) tablet 25 mg  25 mg Oral BID Patrecia Pour, NP   25 mg  at 10/29/21 0844  ? ? ?Lab Results:  ?Results for orders placed or performed during the hospital encounter of 10/28/21 (from the past 48 hour(s))  ?Lipid panel     Status: Abnormal  ? Collection Time: 10/29/21  6:17 AM  ?Result Value Ref Range  ? Cholesterol 152 0 - 200 mg/dL  ? Triglycerides 196 (H) <150 mg/dL  ? HDL 36 (L) >40 mg/dL  ? Total CHOL/HDL Ratio 4.2 RATIO  ? VLDL 39 0 - 40 mg/dL  ? LDL Cholesterol 77 0 - 99 mg/dL  ?  Comment:        ?Total Cholesterol/HDL:CHD Risk ?Coronary Heart Disease Risk Table ?                    Men   Women ? 1/2 Average Risk   3.4   3.3 ? Average Risk       5.0   4.4 ? 2 X Average Risk   9.6   7.1 ? 3 X Average Risk  23.4   11.0 ?       ?Use the calculated Patient Ratio ?above and the CHD Risk Table ?to determine the patient's CHD Risk. ?       ?ATP III CLASSIFICATION (LDL): ? <100     mg/dL   Optimal ? 100-129  mg/dL   Near or Above ?                   Optimal ? 130-159  mg/dL   Borderline ? 160-189  mg/dL   High ? >190     mg/dL   Very High ?Performed at [facility], [address], [place], [place] [address] ?  ? ? ?Blood Alcohol level:  ?Lab Results  ?Component Value Date  ? ETH <10 10/21/2021  ? ETH <10 09/30/2021  ? ? ?Metabolic Disorder Labs: ?Lab Results  ?Component Value Date  ? HGBA1C 5.5 08/12/2021  ? MPG 111.15 08/12/2021  ? MPG 111.15 03/07/2021  ? ?Lab Results  ?Component Value Date  ? PROLACTIN 13.6 04/30/2020  ? ?Lab Results  ?Component Value Date  ? CHOL 152 10/29/2021  ? TRIG 196 (H) 10/29/2021  ? HDL 36 (L) 10/29/2021  ? CHOLHDL 4.2 10/29/2021  ? VLDL 39 10/29/2021  ? [place] 77 10/29/2021  ? [place] 90 08/12/2021  ? ? ?Physical Findings: ?AIMS:  , ,  ,  ,    ?CIWA:    ?COWS:    ? ?Musculoskeletal: ?Strength & Muscle Tone: within normal limits ?Gait & Station: normal ?Patient leans: N/A ? ?Psychiatric Specialty Exam: ? ?Presentation   ?General Appearance: Appropriate for Environment ? ?Eye Contact:Good ? ?Speech:Clear and Coherent ? ?Speech Volume:Normal ? ?Handedness:Right ? ? ?Mood and Affect  ?Mood:Dysphoric ? ?Affect:Congruent (somewhat od

## 2021-10-29 NOTE — Telephone Encounter (Signed)
PA for Ocrevus was approved by [facility]. Effective  07/07/2021 - 10/29/2022. ?

## 2021-10-29 NOTE — Group Note (Signed)
[facility] LCSW Group Therapy Note ? ? ?Group Date: 10/29/2021 ?Start Time: 1400 ?End Time: 1500 ? ?Type of Therapy/Topic:  Group Therapy:  Feelings about Diagnosis ? ?Participation Level:  Did Not Attend  ? ?Description of Group:   ? This group will allow patients to explore their thoughts and feelings about diagnoses they have received. Patients will be guided to explore their level of understanding and acceptance of these diagnoses. Facilitator will encourage patients to process their thoughts and feelings about the reactions of others to their diagnosis, and will guide patients in identifying ways to discuss their diagnosis with significant others in their lives. This group will be process-oriented, with patients participating in exploration of their own experiences as well as giving and receiving support and challenge from other group members. ? ? ?Therapeutic Goals: ?1. Patient will demonstrate understanding of diagnosis as evidence by identifying two or more symptoms of the disorder:  ?2. Patient will be able to express two feelings regarding the diagnosis ?3. Patient will demonstrate ability to communicate their needs through discussion and/or role plays ? ?Summary of Patient Progress: ?X ? ? ? ?Therapeutic Modalities:   ?Cognitive Behavioral Therapy ?Brief Therapy ?Feelings Identification  ? ? ?Ferdinando Lodge J Mariesha Venturella, LCSW ?

## 2021-10-29 NOTE — Plan of Care (Signed)
?  Problem: Education: ?Goal: Knowledge of [facility] Education information/materials will improve ?Outcome: Progressing ?Goal: Mental status will improve ?Outcome: Not Progressing ?  ?Problem: Health Behavior/Discharge Planning: ?Goal: Compliance with treatment plan for underlying cause of condition will improve ?Outcome: Progressing ?  ?

## 2021-10-29 NOTE — BHH Counselor (Signed)
Adult Comprehensive Assessment ? ?Patient ID: Brandon [name], male   DOB: 10/08/1990, 31 y.o.   MRN: [id number] ? ?Information Source: ?Information source:  (Legal Guardian) ? ?Current Stressors:  ?Patient states their primary concerns and needs for treatment are:: Guardian states, "I feel also he is not use to not being home with me <mother>." ?Patient states their goals for this hospitilization and ongoing recovery are:: Guardian states, "the grroup ome is not sufficient for <patient> needs a higher level of care." ?Educational / Learning stressors: none reported ?Employment / Job issues: none reported ?Family Relationships: recently moved from lving with mother to living in a group home. ?Financial / Lack of resources (include bankruptcy): none reported ?Housing / Lack of housing: patient is in need for new group home placement, required to vacate group home on June 17. Social worker at day program is facilitating new group ome search. ?Physical health (include injuries & life threatening diseases): states he has a hx of multiple sclarosis dx at age 24 y/o ?Social relationships: none reported ?Substance abuse: none reported ?Bereavement / Loss: father deceased in 10/17/2019 ? ?Living/Environment/Situation:  ?Living Arrangements: Other (Comment) (Group Home) ?Living conditions (as described by patient or guardian): unable to assess ?Who else lives in the home?: n/a ?How long has patient lived in current situation?: September 04 2021 ?What is atmosphere in current home: Comfortable ? ?Family History:  ?Marital status: Single ?Are you sexually active?: No ?What is your sexual orientation?: Heterosexual ?Has your sexual activity been affected by drugs, alcohol, medication, or emotional stress?: Not assessed. ?Does patient have children?: No ? ?Childhood History:  ?By whom was/is the patient raised?: Father, Mother ?Description of patient's relationship with caregiver when they were a child: States she has a "good" relationship  with the patient; reports patient "loves his dad at the endo of the day i believe it was fine." ?Patient's description of current relationship with people who raised him/her: States relationship with mother is "good;" father is deceased. ?Does patient have siblings?: Yes ?Number of Siblings: 1 ?Description of patient's current relationship with siblings: States the brother is not very supportive of patient ?Did patient suffer any verbal/emotional/physical/sexual abuse as a child?:  (unknown) ?Did patient suffer from severe childhood neglect?: No ?Has patient ever been sexually abused/assaulted/raped as an adolescent or adult?: No ?Was the patient ever a victim of a crime or a disaster?: No ?Witnessed domestic violence?: No ?Has patient been affected by domestic violence as an adult?: No ? 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 ?Employment/Work Situation:   ?Employment Situation: On disability ?Why is Patient on Disability: MS and MH ?How Long has Patient Been on Disability: Since age 13 ?Has Patient ever Been in the Military?: No ? ?Financial Resources:   ?Museum/gallery curator resources: [employer], Medicare ?Does patient have a representative payee or guardian?: Yes ?Name of representative payee or guardian: Brandon [name], mother/guardian, [phone] ? ?Alcohol/Substance Abuse:   ?What has been your use of drugs/alcohol within the last 12 months?: none reported ?If attempted suicide, did drugs/alcohol play a role in this?:  (n/a) ?Alcohol/Substance Abuse Treatment Hx: Denies past history ?Has alcohol/substance abuse ever caused legal problems?: No ? ?Social Support System:   ?Patient's Community Support System: Good ?Type of faith/religion: Darrick Meigs ?How does patient's faith help to cope with current illness?: would like for a chaplin referral to be  placed ? ?  Leisure/Recreation:   ?Do You Have Hobbies?: Yes ?Leisure and Hobbies: Video games ? ?Strengths/Needs:   ?Patient states these barriers may affect/interfere with their treatment: none reported ?Patient states these barriers may affect their return to the community: none reported ?Other important information patient would like considered in planning for their treatment: none reported ? ?Discharge Plan:   ?Currently receiving community mental health services: Yes (From Whom) (Sees Dr. Loni [name] at [facility] on [address]) ?Patient states they will know when they are safe and ready for discharge when: Guardian states she wants him to get a psychiatric evaluation to find a new group home. ?Does patient have access to transportation?: No ?Does patient have financial barriers related to discharge medications?: No ?Will patient be returning to same living situation after discharge?:  (TBD) ? 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 w/ Medicare part A and B; admitted due to diorganized and agressive behaviors. CSW attempted to complete PSA with patient, however, he continues to actively respond to internal stimulus. Unable to engage in meaningful converation; PSA was completed with Brandon [name], mother/guardian. who states the patient has been off his medicaitons and having problems at the group home. Further states that he needs a higher level of care than the group home can provide. Currently serving a 60 day vacate order expiring on June 17. States she wanted him to be evaultated so that it would aid in finding new placement. The patient's social worker at his day program is heading the new group home search. Patient is seen by outpatient provider Dr. Loni [name] at [facility] on [address] in [place]. Denies any knowlege of substance use, UDS not on file. Endorses stress  related to helath complications (MS), recent housing transition, death of father in 11-09-2019, and interpersonal conflict w/ family. Theraputic recomendations include crisis stabilization, medication managment, group therapy, and case management. ? ?Durenda Hurt. 10/29/2021 ?

## 2021-10-29 NOTE — [facility] Suicide Risk Assessment (Signed)
[facility] INPATIENT:  Family/Significant Other Suicide Prevention Education ? ?Suicide Prevention Education:  ?Education Completed; Engineer, maintenance (IT),  (mother/guardian) has been identified by the patient as the family member/significant other with whom the patient will be residing, and identified as the person(s) who will aid the patient in the event of a mental health crisis (suicidal ideations/suicide attempt).  With written consent from the patient, the family member/significant other has been provided the following suicide prevention education, prior to the and/or following the discharge of the patient. ? ?The suicide prevention education provided includes the following: ?Suicide risk factors ?Suicide prevention and interventions ?National Suicide Hotline telephone number ?[facility] assessment telephone number ?[facility] Emergency Assistance 911 ?[place] and/or Residential Mobile Crisis Unit telephone number ? ?Request made of family/significant other to: ?Remove weapons (e.g., guns, rifles, knives), all items previously/currently identified as safety concern.   ?Remove drugs/medications (over-the-counter, prescriptions, illicit drugs), all items previously/currently identified as a safety concern. ? ?The family member/significant other verbalizes understanding of the suicide prevention education information provided.  The family member/significant other agrees to remove the items of safety concern listed above. ? ?Brandon [name] ?10/29/2021, 12:25 PM ?

## 2021-10-29 NOTE — Progress Notes (Signed)
Patient presents as responding to internal stimuli. Patient redirectable. Patient observed interacting appropriately with staff and peers on the unit. Pt compliant with medication administration per MD orders. Pt given education, support, and encouragement to be active in his treatment plan. Pt being monitored Q 15 minutes for safety per unit protocol. Pt remains safe on the unit.  ?

## 2021-10-29 NOTE — Progress Notes (Signed)
D: Patient alert and oriented. Patient denies pain. Patient denies anxiety and depression. Patient denies SI/HI/AVH. ?Patient frequently laughing and loudly responding to internal stimuli throughout shift. Patient slowly paces around the unit.  ? ?A: Scheduled medications administered to patient, per MD orders.  Support and encouragement provided to patient.  ?Q15 minute safety checks maintained.  ? ?R: Patient compliant with medication administration and treatment plan. No adverse drug reactions noted. Patient remains safe on the unit at this time.  ?

## 2021-10-30 DIAGNOSIS — F201 Disorganized schizophrenia: Secondary | ICD-10-CM | POA: Diagnosis not present

## 2021-10-30 MED ORDER — ZIPRASIDONE MESYLATE 20 MG IM SOLR
20.0000 mg | Freq: Two times a day (BID) | INTRAMUSCULAR | Status: DC | PRN
  Administered 2021-10-30 – 2021-11-01 (×2): 20 mg via INTRAMUSCULAR
  Filled 2021-10-30 (×2): qty 20

## 2021-10-30 NOTE — Progress Notes (Signed)
Patient yelling, pacing up and down halls, yelling, fists ball, chanting whats up? Whats up? I'll kill all of you. Patient banging on walls up and down the halls and in dayroom. Patient responding to internal stimuli. Not easily redirectable. MD notified, awaiting orders.  ?

## 2021-10-30 NOTE — Progress Notes (Signed)
Recreation Therapy Notes ? ?INPATIENT RECREATION THERAPY ASSESSMENT ? ?Patient Details ?Name: ROHAAN DURNIL ?MRN: [id number] ?DOB: 12-Jul-1990 ?Today's Date: 10/30/2021 ?      ?Information Obtained From: ?Patient (Patient shrugged his shoulders to every question) ? ?Able to Participate in Assessment/Interview: ?  ? ?Patient Presentation: ?  ? ?Reason for Admission (Per Patient): ?  ? ?Patient Stressors: ?  ? ?Coping Skills:   ?  ? ?Leisure Interests (2+):  ?  ? ?Frequency of Recreation/Participation: ?  ? ?Awareness of Community Resources:  ?  ? ?Community Resources:  ?  ? ?Current Use: ?  ? ?If no, Barriers?: ?  ? ?Expressed Interest in [employer] Information: ?  ? ?[place] of Residence:  ?  ? ?Patient Main Form of Transportation: ?  ? ?Patient Strengths:  ?  ? ?Patient Identified Areas of Improvement:  ?  ? ?Patient Goal for Hospitalization:  ?  ? ?Current SI (including self-harm):  ?  ? ?Current HI:  ?  ? ?Current AVH: ?  ? ?Staff Intervention Plan: ?  ? ?Consent to Intern Participation: ?  ? ?Alli Jasmer ?10/30/2021, 11:36 AM ?

## 2021-10-30 NOTE — Plan of Care (Signed)
Pt denies depression, anxiety, SI, HI and AVH. Pt however is responding to internal stimuli. Pt is med compliant and cooperative. Pt is laughing hysterically in his room alone. Collier Bullock RN ?Problem: Education: ?Goal: Knowledge of [facility] Education information/materials will improve ?Outcome: Progressing ?Goal: Emotional status will improve ?Outcome: Progressing ?Goal: Mental status will improve ?Outcome: Progressing ?Goal: Verbalization of understanding the information provided will improve ?Outcome: Progressing ?  ?Problem: Health Behavior/Discharge Planning: ?Goal: Identification of resources available to assist in meeting health care needs will improve ?Outcome: Progressing ?Goal: Compliance with treatment plan for underlying cause of condition will improve ?Outcome: Progressing ?  ?Problem: Activity: ?Goal: Will identify at least one activity in which they can participate ?Outcome: Progressing ?  ?Problem: Coping: ?Goal: Ability to identify and develop effective coping behavior will improve ?Outcome: Progressing ?Goal: Ability to interact with others will improve ?Outcome: Progressing ?Goal: Demonstration of participation in decision-making regarding own care will improve ?Outcome: Progressing ?Goal: Ability to use eye contact when communicating with others will improve ?Outcome: Progressing ?  ?Problem: Self-Concept: ?Goal: Will verbalize positive feelings about self ?Outcome: Progressing ?  ?

## 2021-10-30 NOTE — Progress Notes (Signed)
Patient in room yelling and laughing. Pt is redirectable. Pt responding to internal stimuli. Pt has been compliant with medication administration per MD orders. Pt remains safe on the unit.  ?

## 2021-10-30 NOTE — Progress Notes (Signed)
Pt has continued to have outburst all day however no harm done. Pt has been med compliant and cooperative. Pt has a good appetite.Pt has no complaints and denies everything. Torrie Mayers RN ?

## 2021-10-30 NOTE — BH IP Treatment Plan (Signed)
Interdisciplinary Treatment and Diagnostic Plan Update ? ?10/30/2021 ?Time of Session: [phone] ?Brandon [name] ?MRN: [id number] ? ?Principal Diagnosis: Schizophrenia, disorganized type ([place]) ? ?Secondary Diagnoses: Principal Problem: ?  Schizophrenia, disorganized type ([place]) ?Active Problems: ?  Obesity, Class III, BMI 40-49.9 (morbid obesity) ([place]) ?  HTN (hypertension) ?  Multiple sclerosis ([place]) ? ? ?Current Medications:  ?Current Facility-Administered Medications  ?Medication Dose Route Frequency Provider Last Rate Last Admin  ? acetaminophen (TYLENOL) tablet 650 mg  650 mg Oral Q6H PRN Patrecia Pour, NP      ? alum & mag hydroxide-simeth (MAALOX/MYLANTA) 200-200-20 MG/5ML suspension 30 mL  30 mL Oral Q4H PRN Patrecia Pour, NP      ? amLODipine (NORVASC) tablet 10 mg  10 mg Oral Daily Clapacs, Madie Reno, MD   10 mg at 10/30/21 0857  ? apixaban (ELIQUIS) tablet 5 mg  5 mg Oral BID Patrecia Pour, NP   5 mg at 10/30/21 4627  ? benztropine (COGENTIN) tablet 1 mg  1 mg Oral BID Clapacs, Madie Reno, MD   1 mg at 10/30/21 0858  ? divalproex (DEPAKOTE) DR tablet 500 mg  500 mg Oral Q12H Clapacs, Madie Reno, MD   500 mg at 10/30/21 0855  ? docusate sodium (COLACE) capsule 100 mg  100 mg Oral BID Clapacs, Madie Reno, MD   100 mg at 10/30/21 0350  ? haloperidol (HALDOL) tablet 10 mg  10 mg Oral BID Patrecia Pour, NP   10 mg at 10/30/21 0938  ? haloperidol (HALDOL) tablet 5 mg  5 mg Oral Q6H PRN Clapacs, Madie Reno, MD      ? Or  ? haloperidol lactate (HALDOL) injection 5 mg  5 mg Intramuscular Q6H PRN Clapacs, Madie Reno, MD   5 mg at 10/30/21 0008  ? latanoprost (XALATAN) 0.005 % ophthalmic solution 1 drop  1 drop Both Eyes QHS Clapacs, John T, MD      ? loratadine (CLARITIN) tablet 10 mg  10 mg Oral Daily PRN Patrecia Pour, NP      ? losartan (COZAAR) tablet 50 mg  50 mg Oral BID Patrecia Pour, NP   50 mg at 10/30/21 1829  ? magnesium hydroxide (MILK OF MAGNESIA) suspension 30 mL  30 mL Oral Daily PRN Patrecia Pour, NP      ?  nicotine (NICODERM CQ - dosed in mg/24 hours) patch 14 mg  14 mg Transdermal Daily Clapacs, Madie Reno, MD   14 mg at 10/30/21 0902  ? OLANZapine (ZYPREXA) tablet 15 mg  15 mg Oral QHS Clapacs, Madie Reno, MD   15 mg at 10/29/21 2054  ? temazepam (RESTORIL) capsule 15 mg  15 mg Oral QHS Clapacs, Madie Reno, MD   15 mg at 10/29/21 2054  ? topiramate (TOPAMAX) tablet 25 mg  25 mg Oral BID Patrecia Pour, NP   25 mg at 10/30/21 0857  ? ?PTA Medications: ?Medications Prior to Admission  ?Medication Sig Dispense Refill Last Dose  ? acetaminophen (TYLENOL) 500 MG tablet Take 1,000 mg by mouth every 6 (six) hours as needed for moderate pain or headache.     ? amantadine (SYMMETREL) 100 MG capsule Take 100 mg by mouth 2 (two) times daily. 0800,1900     ? amLODipine (NORVASC) 5 MG tablet Take 5 mg by mouth in the morning and at bedtime.     ? apixaban (ELIQUIS) 5 MG TABS tablet Take 5 mg by mouth 2 (two) times daily.      ?  ARIPiprazole (ABILIFY) 20 MG tablet Take 20 mg by mouth at bedtime as needed (anxiety).     ? Asenapine Maleate 10 MG SUBL Place 10 mg under the tongue daily at 12 noon. (Patient not taking: Reported on 10/22/2021)     ? cloZAPine (CLOZARIL) 100 MG tablet Take 200 mg by mouth at bedtime. (Patient not taking: Reported on 10/22/2021)     ? docusate sodium (COLACE) 100 MG capsule Take 400 mg by mouth at bedtime.     ? haloperidol (HALDOL) 10 MG tablet Take 10 mg by mouth 2 (two) times daily.     ? hydrOXYzine (ATARAX/VISTARIL) 25 MG tablet Take 25 mg by mouth 3 (three) times daily.     ? ibuprofen (ADVIL) 800 MG tablet Take 1 tablet (800 mg total) by mouth every 8 (eight) hours as needed. 30 tablet 0   ? loratadine (CLARITIN) 10 MG tablet Take 10 mg by mouth daily as needed for allergies. (Patient not taking: Reported on 10/22/2021)     ? LORazepam (ATIVAN) 1 MG tablet Take 1 mg by mouth 2 (two) times daily. (Patient not taking: Reported on 10/22/2021)     ? losartan (COZAAR) 50 MG tablet Take 50 mg by mouth 2 (two) times  daily. (Patient not taking: Reported on 10/22/2021)     ? LUMIGAN 0.01 % SOLN Place 1 drop into both eyes at bedtime. (Patient not taking: Reported on 10/22/2021)     ? metFORMIN (GLUCOPHAGE-XR) 500 MG 24 hr tablet Take 500 mg by mouth 2 (two) times daily.     ? Multiple Vitamin (MULTIVITAMIN WITH MINERALS) TABS tablet Take 1 tablet by mouth daily at 12 noon.     ? OLANZapine (ZYPREXA) 10 MG tablet Take 10 mg by mouth at bedtime.     ? topiramate (TOPAMAX) 25 MG tablet TAKE ONE TABLET BY MOUTH TWICE A DAY 60 tablet 5   ? ? ?Patient Stressors: Health problems   ?Traumatic event   ? ?Patient Strengths: Communication skills  ?Motivation for treatment/growth  ?Supportive family/friends  ? ?Treatment Modalities: Medication Management, Group therapy, Case management,  ?1 to 1 session with clinician, Psychoeducation, Recreational therapy. ? ? ?Physician Treatment Plan for Primary Diagnosis: Schizophrenia, disorganized type ([place]) ?Long Term Goal(s): Improvement in symptoms so as ready for discharge  ? ?Short Term Goals: Ability to maintain clinical measurements within normal limits will improve ?Compliance with prescribed medications will improve ?Ability to demonstrate self-control will improve ?Ability to identify and develop effective coping behaviors will improve ? ?Medication Management: Evaluate patient's response, side effects, and tolerance of medication regimen. ? ?Therapeutic Interventions: 1 to 1 sessions, Unit Group sessions and Medication administration. ? ?Evaluation of Outcomes: Not Met ? ?Physician Treatment Plan for Secondary Diagnosis: Principal Problem: ?  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  Obesity, Class III, BMI 40-49.9 (morbid obesity) ([place]) ?  HTN (hypertension) ?  Multiple sclerosis ([place]) ? ?Long Term Goal(s): Improvement in symptoms so as ready for discharge  ? ?Short Term Goals: Ability to maintain clinical measurements within normal limits will improve ?Compliance with  prescribed medications will improve ?Ability to demonstrate self-control will improve ?Ability to identify and develop effective coping behaviors will improve    ? ?Medication Management: Evaluate patient's response, side effects, and tolerance of medication regimen. ? ?Therapeutic Interventions: 1 to 1 sessions, Unit Group sessions and Medication administration. ? ?Evaluation of Outcomes: Not Met ? ? ?RN Treatment Plan for Primary Diagnosis: Schizophrenia, disorganized type ([place]) ?Long Term Goal(s): Knowledge  of disease and therapeutic regimen to maintain health will improve ? ?Short Term Goals: Ability to remain free from injury will improve, Ability to verbalize frustration and anger appropriately will improve, Ability to demonstrate self-control, Ability to participate in decision making will improve, Ability to verbalize feelings will improve, Ability to disclose and discuss suicidal ideas, Ability to identify and develop effective coping behaviors will improve, and Compliance with prescribed medications will improve ? ?Medication Management: RN will administer medications as ordered by provider, will assess and evaluate patient's response and provide education to patient for prescribed medication. RN will report any adverse and/or side effects to prescribing provider. ? ?Therapeutic Interventions: 1 on 1 counseling sessions, Psychoeducation, Medication administration, Evaluate responses to treatment, Monitor vital signs and CBGs as ordered, Perform/monitor CIWA, COWS, AIMS and Fall Risk screenings as ordered, Perform wound care treatments as ordered. ? ?Evaluation of Outcomes: Not Met ? ? ?LCSW Treatment Plan for Primary Diagnosis: Schizophrenia, disorganized type ([place]) ?Long Term Goal(s): Safe transition to appropriate next level of care at discharge, Engage patient in therapeutic group addressing interpersonal concerns. ? ?Short Term Goals: Engage patient in aftercare planning with referrals and resources,  Increase social support, Increase ability to appropriately verbalize feelings, Increase emotional regulation, Facilitate acceptance of mental health diagnosis and concerns, Facilitate patient progression through

## 2021-10-30 NOTE — Progress Notes (Signed)
[facility] MD Progress Note ? ?10/30/2021 2:05 PM ?Brandon [name]  ?MRN:  [id number] ?Subjective: Follow-up 31 year old man with schizophrenia.  Patient has been out of his room much of the day today.  He continues to have episodes of loud vocalization but they so far have been less agitated than yesterday.  He has not been threatening or hostile to anyone.  He has been cooperative with medicine.  Patient is asking when he will be able to be discharged. 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  Obesity, Class III, BMI 40-49.9 (morbid obesity) ([place]) ?  HTN (hypertension) ?  Multiple sclerosis ([place]) ? ?Total Time spent with patient: 30 minutes ? ?Past Psychiatric History: Past history of longstanding mental health problems diagnosed with schizophrenia also longstanding history of multiple sclerosis ? ?Past Medical History:  ?Past Medical History:  ?Diagnosis Date  ? ADHD (attention deficit hyperactivity disorder)   ? Bipolar 1 disorder ([place])   ? Chronic back pain   ? Chronic constipation   ? Chronic neck pain   ? Hypertension   ? Multiple sclerosis ([place]) 05/20/2013  ? left sided weakness, dysarthria  ? Non-compliance   ? Obesity   ? Pulmonary embolism ([place])   ? Schizophrenia ([place])   ? Stroke [facility])   ? left sided deficits - pt's mother denies this  ?  ?Past Surgical History:  ?Procedure Laterality Date  ? NO PAST SURGERIES    ? None    ? RADIOLOGY WITH ANESTHESIA N/A 01/16/2021  ? Procedure: MRI WITH ANESTHESIA CERVICAL AND THORASIC SPINE WITH AND WITHOUT CONTRAST;  Surgeon: Radiologist, Medication, MD;  Location: [place];  Service: Radiology;  Laterality: N/A;  ? TOOTH EXTRACTION N/A 06/24/2019  ? Procedure: DENTAL RESTORATION/EXTRACTION OF TEETH NUMBER ONE, SIXTEEN, SEVENTEEN, NINETEEN, THIRTY-TWO;  Surgeon: Diona Browner, DDS;  Location: [place];  Service: Oral Surgery;  Laterality: N/A;  ? ?Family History:  ?Family  History  ?Problem Relation Age of Onset  ? Diabetes Mother   ? ADD / ADHD Brother   ? ?Family Psychiatric  History: See previous ?Social History:  ?Social History  ? ?Substance and Sexual Activity  ?Alcohol Use Not Currently  ? Alcohol/week: 0.0 standard drinks  ? Comment: "A little bit"   ?   ?Social History  ? ?Substance and Sexual Activity  ?Drug Use Not Currently  ? Types: Marijuana  ? Comment: Last used: unknown   ?  ?Social History  ? ?Socioeconomic History  ? Marital status: Single  ?  Spouse name: Not on file  ? Number of children: 0  ? Years of education: 11th  ? Highest education level: Not on file  ?Occupational History  ? Occupation: unemployed  ?  Employer: [employer]  ?  Comment: Disbaled  ?Tobacco Use  ? Smoking status: Every Day  ?  Packs/day: 0.25  ?  Types: Cigarettes  ? Smokeless tobacco: Never  ? Tobacco comments:  ?  2 cigarettes a day  ?Vaping Use  ? Vaping Use: Never used  ?Substance and Sexual Activity  ? Alcohol use: Not Currently  ?  Alcohol/week: 0.0 standard drinks  ?  Comment: "A little bit"   ? Drug use: Not Currently  ?  Types: Marijuana  ?  Comment: Last used: unknown   ? Sexual activity: Not on file  ?Other Topics Concern  ? Not on file  ?Social History Narrative  ? Patient lives at home with  his mother.  ? Disabled.  ? Education 11 th grade .  ? Right handed.  ? Drinks caffeine occassionally  ? ?Social Determinants of Health  ? ?Financial Resource Strain: Not on file  ?Food Insecurity: Not on file  ?Transportation Needs: Not on file  ?Physical Activity: Not on file  ?Stress: Not on file  ?Social Connections: Not on file  ? ?Additional Social History:  ?  ?  ?  ?  ?  ?  ?  ?  ?  ?  ?  ? ?Sleep: Fair ? ?Appetite:  Fair ? ?Current Medications: ?Current Facility-Administered Medications  ?Medication Dose Route Frequency Provider Last Rate Last Admin  ? acetaminophen (TYLENOL) tablet 650 mg  650 mg Oral Q6H PRN Patrecia Pour, NP      ? alum & mag hydroxide-simeth (MAALOX/MYLANTA)  200-200-20 MG/5ML suspension 30 mL  30 mL Oral Q4H PRN Patrecia Pour, NP      ? amLODipine (NORVASC) tablet 10 mg  10 mg Oral Daily Thaddeaus Monica, Madie Reno, MD   10 mg at 10/30/21 0857  ? apixaban (ELIQUIS) tablet 5 mg  5 mg Oral BID Patrecia Pour, NP   5 mg at 10/30/21 [id number]  ? benztropine (COGENTIN) tablet 1 mg  1 mg Oral BID Namari Breton, Madie Reno, MD   1 mg at 10/30/21 0858  ? divalproex (DEPAKOTE) DR tablet 500 mg  500 mg Oral Q12H Kallan Merrick, Madie Reno, MD   500 mg at 10/30/21 0855  ? docusate sodium (COLACE) capsule 100 mg  100 mg Oral BID Khadijah Mastrianni, Madie Reno, MD   100 mg at 10/30/21 [id number]  ? haloperidol (HALDOL) tablet 10 mg  10 mg Oral BID Patrecia Pour, NP   10 mg at 10/30/21 [id number]  ? haloperidol (HALDOL) tablet 5 mg  5 mg Oral Q6H PRN Lennis Korb, Madie Reno, MD      ? Or  ? haloperidol lactate (HALDOL) injection 5 mg  5 mg Intramuscular Q6H PRN Tonetta Napoles, Madie Reno, MD   5 mg at 10/30/21 1353  ? latanoprost (XALATAN) 0.005 % ophthalmic solution 1 drop  1 drop Both Eyes QHS Jalisia Puchalski T, MD      ? loratadine (CLARITIN) tablet 10 mg  10 mg Oral Daily PRN Patrecia Pour, NP      ? losartan (COZAAR) tablet 50 mg  50 mg Oral BID Patrecia Pour, NP   50 mg at 10/30/21 [id number]  ? magnesium hydroxide (MILK OF MAGNESIA) suspension 30 mL  30 mL Oral Daily PRN Patrecia Pour, NP      ? nicotine (NICODERM CQ - dosed in mg/24 hours) patch 14 mg  14 mg Transdermal Daily Aveya Beal, Madie Reno, MD   14 mg at 10/30/21 0902  ? OLANZapine (ZYPREXA) tablet 15 mg  15 mg Oral QHS Jernee Murtaugh, Madie Reno, MD   15 mg at 10/29/21 2054  ? temazepam (RESTORIL) capsule 15 mg  15 mg Oral QHS Janice Seales, Madie Reno, MD   15 mg at 10/29/21 2054  ? topiramate (TOPAMAX) tablet 25 mg  25 mg Oral BID Patrecia Pour, NP   25 mg at 10/30/21 [id number]  ? ? ?Lab Results:  ?Results for orders placed or performed during the hospital encounter of 10/28/21 (from the past 48 hour(s))  ?Hemoglobin A1c     Status: None  ? Collection Time: 10/29/21  6:17 AM  ?Result Value Ref Range  ? Hgb A1c MFr Bld 5.6  4.8 - 5.6 %  ?  Comment: (NOTE) ?Pre diabetes:          5.7%-6.4% ? ?Diabetes:              >6.4% ? ?Glycemic control for   <7.0% ?adults with diabetes ?  ? Mean Plasma Glucose 114.02 mg/dL  ?  Comment: Performed at [facility], [place] [address], [place], [place] [address]  ?Lipid panel     Status: Abnormal  ? Collection Time: 10/29/21  6:17 AM  ?Result Value Ref Range  ? Cholesterol 152 0 - 200 mg/dL  ? Triglycerides 196 (H) <150 mg/dL  ? HDL 36 (L) >40 mg/dL  ? Total CHOL/HDL Ratio 4.2 RATIO  ? VLDL 39 0 - 40 mg/dL  ? LDL Cholesterol 77 0 - 99 mg/dL  ?  Comment:        ?Total Cholesterol/HDL:CHD Risk ?Coronary Heart Disease Risk Table ?                    Men   Women ? 1/2 Average Risk   3.4   3.3 ? Average Risk       5.0   4.4 ? 2 X Average Risk   9.6   7.1 ? 3 X Average Risk  23.4   11.0 ?       ?Use the calculated Patient Ratio ?above and the CHD Risk Table ?to determine the patient's CHD Risk. ?       ?ATP III CLASSIFICATION (LDL): ? <100     mg/dL   Optimal ? 100-129  mg/dL   Near or Above ?                   Optimal ? 130-159  mg/dL   Borderline ? 160-189  mg/dL   High ? >190     mg/dL   Very High ?Performed at [facility], [address] [place], [place] [address] ?  ? ? ?Blood Alcohol level:  ?Lab Results  ?Component Value Date  ? ETH <10 10/21/2021  ? ETH <10 09/30/2021  ? ? ?Metabolic Disorder Labs: ?Lab Results  ?Component Value Date  ? HGBA1C 5.6 10/29/2021  ? MPG 114.02 10/29/2021  ? MPG 111.15 08/12/2021  ? ?Lab Results  ?Component Value Date  ? PROLACTIN 13.6 04/30/2020  ? ?Lab Results  ?Component Value Date  ? CHOL 152 10/29/2021  ? TRIG 196 (H) 10/29/2021  ? HDL 36 (L) 10/29/2021  ? CHOLHDL 4.2 10/29/2021  ? VLDL 39 10/29/2021  ? [place] 77 10/29/2021  ? [place] 90 08/12/2021  ? ? ?Physical Findings: ?AIMS:  , ,  ,  ,    ?CIWA:    ?COWS:    ? ?Musculoskeletal: ?Strength & Muscle Tone: within normal limits ?Gait & Station: normal ?Patient leans: N/A ? ?Psychiatric Specialty  Exam: ? ?Presentation  ?General Appearance: Appropriate for Environment ? ?Eye Contact:Good ? ?Speech:Clear and Coherent ? ?Speech Volume:Normal ? ?Handedness:Right ? ? ?Mood and Affect  ?Mood:Dysphoric ? ?[place]

## 2021-10-30 NOTE — Plan of Care (Signed)
?  Problem: Education: ?Goal: Emotional status will improve ?Outcome: Not Progressing ?Goal: Mental status will improve ?Outcome: Not Progressing ?Goal: Verbalization of understanding the information provided will improve ?Outcome: Not Progressing ?  ?Problem: Health Behavior/Discharge Planning: ?Goal: Compliance with treatment plan for underlying cause of condition will improve ?Outcome: Progressing ?  ?

## 2021-10-30 NOTE — Progress Notes (Signed)
Recreation Therapy Notes ? ?INPATIENT RECREATION TR PLAN ? ?Patient Details ?Name: Brandon [name] ?MRN: [id number] ?DOB: 12-04-1990 ?Today's Date: 10/30/2021 ? ?Rec Therapy Plan ?Is patient appropriate for Therapeutic Recreation?: Yes ?Treatment times per week: at least 3 ?Estimated Length of Stay: 5-7 days ?TR Treatment/Interventions: Group participation (Comment) ? ?Discharge Criteria ?Pt will be discharged from therapy if:: Discharged ?Treatment plan/goals/alternatives discussed and agreed upon by:: Patient/family ? ?Discharge Summary ?  ? ? ?Canna Nickelson ?10/30/2021, 11:36 AM ?

## 2021-10-30 NOTE — Group Note (Signed)
[facility] LCSW Group Therapy Note ? ? ?Group Date: 10/30/2021 ?Start Time: 1300 ?End Time: 1400 ? ? ?Type of Therapy/Topic:  Group Therapy:  Emotion Regulation ? ?Participation Level:  Did Not Attend  ? ?Mood: ? ?Description of Group:   ? The purpose of this group is to assist patients in learning to regulate negative emotions and experience positive emotions. Patients will be guided to discuss ways in which they have been vulnerable to their negative emotions. These vulnerabilities will be juxtaposed with experiences of positive emotions or situations, and patients challenged to use positive emotions to combat negative ones. Special emphasis will be placed on coping with negative emotions in conflict situations, and patients will process healthy conflict resolution skills. ? ?Therapeutic Goals: ?Patient will identify two positive emotions or experiences to reflect on in order to balance out negative emotions:  ?Patient will label two or more emotions that they find the most difficult to experience:  ?Patient will be able to demonstrate positive conflict resolution skills through discussion or role plays:  ? ?Summary of Patient Progress: ? ? ?Patient did not attend, physically aggressive at this time. Patient given PRN for agitation by nursing staff.  ? ? ? ?Therapeutic Modalities:   ?Cognitive Behavioral Therapy ?Feelings Identification ?Dialectical Behavioral Therapy ? ? ?Corky Crafts, LCSWA ?

## 2021-10-30 NOTE — Progress Notes (Signed)
Patient calmer after medication. Still noted in room yelling loudly, laughing at times and responding. Patient currently in dayroom. Did take all medications. Complained of headache. Tylenol given with good relief. Emotional support provided. Patient safe and monitored with 15 min checks. ?

## 2021-10-30 NOTE — Progress Notes (Signed)
Pt agitated and continues to respond to internal stimuli. Pt asked for medication to help. PRN medication administered per MD orders. Pt remains to be safe on the unit ?

## 2021-10-30 NOTE — Progress Notes (Signed)
Pt became irritable, loud, using verbal threats He received a PRN. He is now calm in his room. Torrie Mayers RN ?

## 2021-10-30 NOTE — Progress Notes (Signed)
Recreation Therapy Notes ? ?  ?Date: 10/30/2021 ?  ?Time: 11:00 am  ?  ?Location: Craft room   ?  ?Behavioral response: Appropriate ?  ?Intervention Topic: Time Management   ?  ?Discussion/Intervention:  ?Group content today was focused on time management. The group defined time management and identified healthy ways to manage time. Individuals expressed how much of the 24 hours they use in a day. Patients expressed how much time they use just for themselves personally. The group expressed how they have managed their time in the past. Individuals participated in the intervention ?Managing Life? where they had a chance to see how much of the 24 hours they use and where it goes. ?Clinical Observations/Feedback: ?Patient came to group and shrugged his shoulders to every prompt and question.Individual did not provide any contribution toward group topic/discussion.    ?Tandre Conly LRT/CTRS ? ? ? ? ? ? ? ?Kemesha Mosey ?10/30/2021 11:11 AM ?

## 2021-10-31 DIAGNOSIS — F201 Disorganized schizophrenia: Secondary | ICD-10-CM | POA: Diagnosis not present

## 2021-10-31 MED ORDER — BENZTROPINE MESYLATE 1 MG PO TABS: 1.0000 mg | ORAL_TABLET | Freq: Two times a day (BID) | ORAL | 0 refills | Status: DC

## 2021-10-31 MED ORDER — LUMIGAN 0.01 % OP SOLN
1.0000 [drp] | Freq: Every day | OPHTHALMIC | 0 refills | Status: DC
Start: 2021-10-31 — End: 2021-11-01

## 2021-10-31 MED ORDER — AMLODIPINE BESYLATE 10 MG PO TABS: 10.0000 mg | ORAL_TABLET | Freq: Every day | ORAL | 0 refills | Status: DC

## 2021-10-31 MED ORDER — TOPIRAMATE 25 MG PO TABS: 25.0000 mg | ORAL_TABLET | Freq: Two times a day (BID) | ORAL | 0 refills | Status: DC

## 2021-10-31 MED ORDER — APIXABAN 5 MG PO TABS
5.0000 mg | ORAL_TABLET | Freq: Two times a day (BID) | ORAL | 0 refills | Status: DC
Start: 2021-10-31 — End: 2021-11-01

## 2021-10-31 MED ORDER — OLANZAPINE 15 MG PO TABS: 15.0000 mg | ORAL_TABLET | Freq: Every day | ORAL | 0 refills | Status: DC

## 2021-10-31 MED ORDER — NICOTINE 14 MG/24HR TD PT24: 14.0000 mg | MEDICATED_PATCH | Freq: Every day | TRANSDERMAL | 0 refills | Status: DC

## 2021-10-31 MED ORDER — TEMAZEPAM 15 MG PO CAPS: 15.0000 mg | ORAL_CAPSULE | Freq: Every day | ORAL | 0 refills | Status: DC

## 2021-10-31 MED ORDER — LORATADINE 10 MG PO TABS
10.0000 mg | ORAL_TABLET | Freq: Every day | ORAL | 0 refills | Status: DC | PRN
Start: 2021-10-31 — End: 2021-11-01

## 2021-10-31 MED ORDER — LOSARTAN POTASSIUM 50 MG PO TABS: 50.0000 mg | ORAL_TABLET | Freq: Two times a day (BID) | ORAL | 0 refills | Status: DC

## 2021-10-31 MED ORDER — DOCUSATE SODIUM 100 MG PO CAPS: 100.0000 mg | ORAL_CAPSULE | Freq: Two times a day (BID) | ORAL | 0 refills | Status: DC

## 2021-10-31 MED ORDER — DIVALPROEX SODIUM 500 MG PO DR TAB: 500.0000 mg | DELAYED_RELEASE_TABLET | Freq: Two times a day (BID) | ORAL | 0 refills | Status: DC

## 2021-10-31 MED ORDER — HALOPERIDOL 10 MG PO TABS: 10.0000 mg | ORAL_TABLET | Freq: Two times a day (BID) | ORAL | 0 refills | Status: DC

## 2021-10-31 NOTE — [facility] Suicide Risk Assessment (Signed)
[facility] Discharge Suicide Risk Assessment ? ? ?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HCC) ?Discharge Diagnoses: Principal Problem: ?  Schizophrenia, disorganized type (HCC) ?Active Problems: ?  Obesity, Class III, BMI 40-49.9 (morbid obesity) (HCC) ?  HTN (hypertension) ?  Multiple sclerosis (HCC) ? ? ?Total Time spent with patient: 30 minutes ? ?Musculoskeletal: ?Strength & Muscle Tone: within normal limits ?Gait & Station: normal ?Patient leans: N/A ? ?Psychiatric Specialty Exam ? ?Presentation  ?General Appearance: Appropriate for Environment ? ?Eye Contact:Good ? ?Speech:Clear and Coherent ? ?Speech Volume:Normal ? ?Handedness:Right ? ? ?Mood and Affect  ?Mood:Dysphoric ? ?Duration of Depression Symptoms: Greater than two weeks ? ?Affect:Congruent (somewhat odd) ? ? ?Thought Process  ?Thought Processes:Disorganized (able to answer some questions appropriately) ? ?Descriptions of Associations:Loose ? ?Orientation:Partial ? ?Thought Content:Other (comment) (able to answer some questions appropriately) ? ?History of Schizophrenia/Schizoaffective disorder:Yes ? ?Duration of Psychotic Symptoms:Greater than six months ? ?Hallucinations:No data recorded ?Ideas of Reference:None ? ?Suicidal Thoughts:No data recorded ?Homicidal Thoughts:No data recorded ? ?Sensorium  ?Memory:Recent Fair; Immediate Fair ? ?Judgment:Impaired ? ?Insight:Lacking ? ? ?Executive Functions  ?Concentration:Poor ? ?Attention Span:Poor ? ?Recall:Poor ? ?Fund of Knowledge:Poor ? ?Language:Poor ? ? ?Psychomotor Activity  ?Psychomotor Activity:No data recorded ? ?Assets  ?Assets:Resilience; Physical Health; Social Support ? ? ?Sleep  ?Sleep:No data recorded ? ?Physical Exam: ?Physical Exam ?Vitals and nursing note reviewed.  ?Constitutional:   ?   Appearance: Normal appearance.  ?HENT:  ?   Head: Normocephalic and atraumatic.  ?   Mouth/Throat:  ?   Pharynx: Oropharynx is clear.  ?Eyes:  ?   Pupils: Pupils are equal, round, and reactive to  light.  ?Cardiovascular:  ?   Rate and Rhythm: Normal rate and regular rhythm.  ?Pulmonary:  ?   Effort: Pulmonary effort is normal.  ?   Breath sounds: Normal breath sounds.  ?Abdominal:  ?   General: Abdomen is flat.  ?   Palpations: Abdomen is soft.  ?Musculoskeletal:     ?   General: Normal range of motion.  ?Skin: ?   General: Skin is warm and dry.  ?Neurological:  ?   General: No focal deficit present.  ?   Mental Status: He is alert. Mental status is at baseline.  ?Psychiatric:     ?   Attention and Perception: He is inattentive.     ?   Mood and Affect: Mood normal. Affect is labile and blunt.     ?   Speech: Speech is tangential.     ?   Behavior: Behavior is slowed.     ?   Cognition and Memory: Cognition is impaired. Memory is impaired.     ?   Judgment: Judgment is inappropriate.  ? ?Review of Systems  ?Constitutional: Negative.   ?HENT: Negative.    ?Eyes: Negative.   ?Respiratory: Negative.    ?Cardiovascular: Negative.   ?Gastrointestinal: Negative.   ?Musculoskeletal: Negative.   ?Skin: Negative.   ?Neurological: Negative.   ?Psychiatric/Behavioral: Negative.    ?Blood pressure 124/65, pulse 88, temperature 98.5 ?F (36.9 ?C), temperature source Oral, resp. rate 18, height 6\' 2"  (1.88 m), weight (!) 142.9 kg, SpO2 100 %. Body mass index is 40.44 kg/m?. ? ?Mental Status Per Nursing Assessment::   ?On Admission:  NA ? ?Demographic Factors:  ?Male ? ?Loss Factors: ?Recent change of living arrangements ? ?Historical Factors: ?Impulsivity ? ?Risk Reduction Factors:   ?Positive social support and Positive therapeutic relationship ? ?Continued Clinical Symptoms:  ?Schizophrenia:   Less than  36 years old ? ?Cognitive Features That Contribute To Risk:  ?Loss of executive function   ? ?Suicide Risk:  ?Minimal: No identifiable suicidal ideation.  Patients presenting with no risk factors but with morbid ruminations; may be classified as minimal risk based on the severity of the depressive symptoms ? ? ? ?Plan Of  Care/Follow-up recommendations:  ?Patient at the time of discharge is not displaying any suicidal behavior and has not made any suicidal statements.  He understands the plan to return to his group home and is agreeable.  Not voicing or showing evidence of any acute hostility.  Medication compliant.  Recommend continued follow-up with outpatient psychiatric providers ? ?Mordecai Rasmussen, MD ?10/31/2021, 9:49 AM ?

## 2021-10-31 NOTE — Progress Notes (Signed)
[facility] LCSW Progress Note ?  ?10/31/2021    10:28 AM ? ?ARCHIT LEGER  ? ?[phone]  ? ?Type of Contact and Topic:  Discharge Planning  ? ?CSW contacted guardian and group home to inform them of discharge. Mother states she can not pick patient up due to her medical condition and living out of town. Group home owner states he can not pick up patient until tomorrow 11/01/2021 as he is out of town.  ? ?CSW notified Physician, discharge order remains in place.  ? ?Patient has medicare; guardian reports she intends on appealing discharge.  ? ?  ?Signed:  ?Corky Crafts, MSW, LCSWA, LCAS ?10/31/2021 10:28 AM ?  ?  ?

## 2021-10-31 NOTE — Plan of Care (Signed)
  Problem: Education: Goal: Knowledge of [facility] Education information/materials will improve Outcome: Progressing Goal: Emotional status will improve Outcome: Progressing Goal: Mental status will improve Outcome: Progressing Goal: Verbalization of understanding the information provided will improve Outcome: Progressing   Problem: Health Behavior/Discharge Planning: Goal: Identification of resources available to assist in meeting health care needs will improve Outcome: Progressing Goal: Compliance with treatment plan for underlying cause of condition will improve Outcome: Progressing   Problem: Activity: Goal: Will identify at least one activity in which they can participate Outcome: Progressing   Problem: Coping: Goal: Ability to identify and develop effective coping behavior will improve Outcome: Progressing Goal: Ability to interact with others will improve Outcome: Progressing Goal: Demonstration of participation in decision-making regarding own care will improve Outcome: Progressing Goal: Ability to use eye contact when communicating with others will improve Outcome: Progressing   Problem: Self-Concept: Goal: Will verbalize positive feelings about self Outcome: Progressing   

## 2021-10-31 NOTE — [place] FL2 (Addendum)
?[place] MEDICAID FL2 LEVEL OF CARE SCREENING TOOL  ?  ? ?IDENTIFICATION  ?Patient Name: ?Brandon [name] Birthdate: [birth year] Sex: male Admission Date (Current Location): ?10/28/2021  ?[place] Number: ? [place] ?[id number] M Facility and Address:  ?[facility], [address], [place], [place] [address] ?     Provider Number: ?[id number]  ?Attending Physician Name and Address:  ?Clapacs, Jackquline Denmark, MD ? Relative Name and Phone Number:  ?Johny Blamer, Mother/Guardian ?   ?Current Level of Care: ?Hospital Recommended Level of Care: ?Other (Comment) (Group Home) Prior Approval Number: ?  ? ?Date Approved/Denied: ?  PASRR Number: ?  ? ?Discharge Plan: ?Other (Comment) (Group Home) ?  ? ?Current Diagnoses: ?Patient Active Problem List  ? Diagnosis Date Noted  ? Schizophrenia, 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  ? Acute alcohol intoxication (HCC) 07/17/2021  ? Schizophrenia, 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HCC) 03/05/2021  ? 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 Migraine headache 04/18/2019  ? Generalized weakness   ? Multiple sclerosis (HCC) 05/20/2013  ? HTN (hypertension) 07/04/2011  ? Ataxia 07/02/2011  ? Obesity, Class III, BMI 40-49.9 (morbid obesity) (HCC) 01/14/2010  ? Attention deficit hyperactivity disorder (ADHD) 01/14/2010  ? ? ?Orientation RESPIRATION BLADDER Height & Weight   ?  ?Self, Time, Place, Situation ? Normal Continent Weight: (!) 142.9 kg ?Height:  6\' 2"  (188 cm)  ?BEHAVIORAL SYMPTOMS/MOOD NEUROLOGICAL BOWEL NUTRITION STATUS  ?Verbally abusive  (Multiple Sclerosis) Continent Diet  ?AMBULATORY STATUS COMMUNICATION OF NEEDS Skin   ?Independent Verbally Normal ?  ?  ?  ?    ?     ?     ? ? ?Personal Care Assistance Level of Assistance  ? (WNL)   ?  ?  ?   ? ?Functional Limitations Info  ? (WNL)   ?  ?   ? ? ?SPECIAL CARE FACTORS FREQUENCY  ? (n/a)   ?  ?  ?  ?  ?  ?  ?   ? ? ?Contractures Contractures Info: Not present  ? ? ?Additional Factors Info  ? (No known  allergies)   ?  ?  ?  ?  ?   ? ?Current Medications (10/31/2021):  This is the current hospital active medication list ?Current Facility-Administered Medications  ?Medication Dose Route Frequency Provider Last Rate Last Admin  ? acetaminophen (TYLENOL) tablet 650 mg  650 mg Oral Q6H PRN 11/02/2021, NP   650 mg at 10/30/21 2113  ? alum & mag hydroxide-simeth (MAALOX/MYLANTA) 200-200-20 MG/5ML suspension 30 mL  30 mL Oral Q4H PRN 01-13-2001, NP      ? amLODipine (NORVASC) tablet 10 mg  10 mg Oral Daily Clapacs, Charm Rings, MD   10 mg at 10/31/21 0857  ? apixaban (ELIQUIS) tablet 5 mg  5 mg Oral BID 11/02/21, NP   5 mg at 10/31/21 11/02/21  ? benztropine (COGENTIN) tablet 1 mg  1 mg Oral BID Clapacs, [id number], MD   1 mg at 10/31/21 0857  ? divalproex (DEPAKOTE) DR tablet 500 mg  500 mg Oral Q12H Clapacs, 11/02/21, MD   500 mg at 10/31/21 0857  ? docusate sodium (COLACE) capsule 100 mg  100 mg Oral BID Clapacs, 11/02/21, MD   100 mg at 10/31/21 0857  ? haloperidol (HALDOL) tablet 10 mg  10 mg Oral BID 11/02/21, NP   10 mg at 10/31/21 0857  ? haloperidol (HALDOL) tablet 5 mg  5 mg Oral Q6H PRN  Clapacs, Jackquline Denmark, MD      ? Or  ? haloperidol lactate (HALDOL) injection 5 mg  5 mg Intramuscular Q6H PRN Clapacs, Jackquline Denmark, MD   5 mg at 10/30/21 1353  ? latanoprost (XALATAN) 0.005 % ophthalmic solution 1 drop  1 drop Both Eyes QHS Clapacs, John T, MD   1 drop at 10/30/21 2155  ? loratadine (CLARITIN) tablet 10 mg  10 mg Oral Daily PRN Charm Rings, NP      ? losartan (COZAAR) tablet 50 mg  50 mg Oral BID Charm Rings, NP   50 mg at 10/31/21 8299  ? magnesium hydroxide (MILK OF MAGNESIA) suspension 30 mL  30 mL Oral Daily PRN Charm Rings, NP      ? nicotine (NICODERM CQ - dosed in mg/24 hours) patch 14 mg  14 mg Transdermal Daily Clapacs, Jackquline Denmark, MD   14 mg at 10/31/21 0859  ? OLANZapine (ZYPREXA) tablet 15 mg  15 mg Oral QHS Clapacs, John T, MD   15 mg at 10/30/21 2105  ? temazepam (RESTORIL) capsule 15 mg  15 mg  Oral QHS Clapacs, Jackquline Denmark, MD   15 mg at 10/30/21 2105  ? topiramate (TOPAMAX) tablet 25 mg  25 mg Oral BID Charm Rings, NP   25 mg at 10/31/21 0857  ? ziprasidone (GEODON) injection 20 mg  20 mg Intramuscular Q12H PRN Clapacs, Jackquline Denmark, MD   20 mg at 10/30/21 1934  ? ? ? ?Discharge Medications: ?Please see discharge summary for a list of discharge medications. ? ?Relevant Imaging Results: ? ?Relevant Lab Results: ? ? ?Additional Information ?SS# [id number] ? ?Corky Crafts, LCSWA ? ? ? ? ?

## 2021-10-31 NOTE — Progress Notes (Signed)
D: Patient alert and oriented. Patient denies pain. Patient denies anxiety and depression. Patient denies SI/HI/AVH. ?Patient frequently laughing and loudly responding to internal stimuli throughout shift. Patient slowly paces around the unit.  ? ?A: Scheduled medications administered to patient, per MD orders.  Support and encouragement provided to patient.  ?Q15 minute safety checks maintained.  ? ?R: Patient compliant with medication administration and treatment plan. No adverse drug reactions noted. Patient remains safe on the unit at this time.  ?

## 2021-10-31 NOTE — Progress Notes (Signed)
Pt visible on the unit, no interaction with peers or staff. He denies SI/HI and he admits to hearing voiced and talking to himself. He denies seeing things that are not there. He is quiet on approach, with not much to say.  No complaints voiced. ?

## 2021-10-31 NOTE — Discharge Summary (Signed)
Addendum to previous discharge summary: Order has been modified to make discharge date tomorrow because the group home is unable to pick him up today.  Otherwise no change to plan for discharge as soon as we can get him back to his group home ? ?

## 2021-10-31 NOTE — Progress Notes (Signed)
Recreation Therapy Notes ? ?Date: 10/31/2021 ? ?Time: 10:00 AM  ? ?Location: Courtyard   ? ?Behavioral response: Appropriate ? ?Intervention Topic: Wellness     ? ?Discussion/Intervention:  ?Group content today was focused on Wellness. The group defined wellness and some positive ways they make decisions for themselves. Individuals expressed reasons why they neglected any wellness in the past. Patients described ways to improve wellness skills in the future. The group explained what could happen if they did not do any wellness at all. Participants express how bad choices has affected them and others around them. Individual explained the importance of wellness. The group participated in the intervention ?Testing my Wellness? where they had a chance to identify some of their weaknesses and strengths in wellness.  ?Clinical Observations/Feedback: ?Patient came to group and identified basketball as a wellness activity. Individual was making signs with his fingers while participating in the inventions.  ?[name] LRT/CTRS  ? ? ? ? ? ? ? ?Brandon [name] ?10/31/2021 11:45 AM ?

## 2021-10-31 NOTE — Progress Notes (Addendum)
[facility] LCSW Progress Note ?  ?10/31/2021    11:26 AM ? ?Brandon [name]  ? ?[id number]  ? ?Type of Contact and Topic:  Medicare Appeal  ? ?CSW contacted [name] Gettel, mother/guardian, by phone to verbally discuss HINN 12 and Detailed Notice of Discharge (documents sent to e-mail provided by guardian). CSW confirmed guardian received documents in e-mail. Guardian provided opportunity to ask clarifying  questions.  ? ?Legal guardian is unable to return emailed forms with signature and unable to visit hospital herself to sign in person. Verbal consent obtained. ? ?Legal guardian is aware of potential financial responsibility and justification for discharge.  ? ?  ?Signed:  ?Durenda Hurt, MSW, LCSWA, LCAS ?10/31/2021 11:26 AM ?  ?  ?

## 2021-10-31 NOTE — Plan of Care (Signed)
?  Problem: Education: ?Goal: Knowledge of [facility] Education information/materials will improve ?Outcome: Progressing ?Goal: Verbalization of understanding the information provided will improve ?Outcome: Progressing ?  ?Problem: Health Behavior/Discharge Planning: ?Goal: Compliance with treatment plan for underlying cause of condition will improve ?Outcome: Progressing ?  ?Problem: Coping: ?Goal: Ability to use eye contact when communicating with others will improve ?Outcome: Progressing ?  ?

## 2021-10-31 NOTE — Care Management Important Message (Signed)
Important Message ? ?Patient Details  ?Name: Brandon [name] ?MRN: [id number] ?Date of Birth: Aug 21, 1990 ? ? ?Medicare Important Message Given:  Yes ? ?Patient's guardian states she intends on appealing discharge. CSW provided her with phone number during conversation. Emailed form to [email or web address]. CSW will monitor fax machine for confirmation from [facility].  ? ?Situation ongoing, CSW will continue to monitor and update note as more information becomes available.  ? ? ?Durenda Hurt, LCSWA ?10/31/2021, 10:26 AM ?

## 2021-10-31 NOTE — Discharge Summary (Signed)
Physician Discharge Summary Note  Patient:  Brandon [name] is an 31 y.o., male MRN:  [id number] DOB:  [birth year] Patient phone:  [phone] (home)  Patient address:   [address] [place] [address],  Total Time spent with patient: 30 minutes  Date of Admission:  10/28/2021 Date of Discharge: 10/31/2021  Reason for Admission: Patient was admitted after presenting to the emergency room with agitated behavior at home.  He was agitated for some time in the emergency room but eventually improved with the administration of medicine.  Admitted to the psychiatric ward.  Most of the time he stays to himself and is pretty withdrawn but he has episodes scattered throughout the day of getting very animated.  During those episodes he will become very vocal and sometimes walk around punching walls.  Does not seem to be intending any violence towards anyone and has not made any threats towards anyone.  Denies suicidal ideation and no self harm.  Medicines were addressed.  We are lacking a lot of written information but based on what I can find it seems that he has been on a very wide range of medicines for psychosis and mood stability with only partial improvement at best.  Within the last several months he had been on clozapine.  I am getting mixed information about that.  Apparently the group home did not have it on his list as being recently prescribed so it may be that his outpatient psychiatrist had discontinued it.  Mother reports that she did not think that medicine was helpful although the patient mentioned it by name as being something that he thought was helpful.  In any case since they had not been giving it to him at the group home we did not restart that and instead have been using Haldol, olanzapine and started Depakote.  Other medications such as for blood pressure and blood thinner just continued as usual.  His treatment for multiple sclerosis is an IV just given twice a year and was not needed  at this time.  Patient may be back to his baseline although I think that still in flux since he is only recently moved into a group home situation.  Not benefiting any further from our inpatient treatment and having a safe place to live he will be discharged home.  Principal Problem: Schizophrenia, disorganized type [facility]) Discharge Diagnoses: Principal Problem:   Schizophrenia, disorganized type (HCC) Active Problems:   Obesity, Class III, BMI 40-49.9 (morbid obesity) (HCC)   HTN (hypertension)   Multiple sclerosis (HCC)   Past Psychiatric History: See everything in my note above.  Basically had psychotic disorder since he was a teenager with multiple medication trials and only partial improvement  Past Medical History:  Past Medical History:  Diagnosis Date   ADHD (attention deficit hyperactivity disorder)    Bipolar 1 disorder (HCC)    Chronic back pain    Chronic constipation    Chronic neck pain    Hypertension    Multiple sclerosis (HCC) 05/20/2013   left sided weakness, dysarthria   Non-compliance    Obesity    Pulmonary embolism (HCC)    Schizophrenia (HCC)    Stroke (HCC)    left sided deficits - pt's mother denies this    Past Surgical History:  Procedure Laterality Date   NO PAST SURGERIES     None     RADIOLOGY WITH ANESTHESIA N/A 01/16/2021   Procedure: MRI WITH ANESTHESIA CERVICAL AND THORASIC SPINE WITH AND  WITHOUT CONTRAST;  Surgeon: Radiologist, Medication, MD;  Location: [facility] OR;  Service: Radiology;  Laterality: N/A;   TOOTH EXTRACTION N/A 06/24/2019   Procedure: DENTAL RESTORATION/EXTRACTION OF TEETH NUMBER ONE, SIXTEEN, SEVENTEEN, NINETEEN, THIRTY-TWO;  Surgeon: Ocie Doyne, DDS;  Location: [facility] OR;  Service: Oral Surgery;  Laterality: N/A;   Family History:  Family History  Problem Relation Age of Onset   Diabetes Mother    ADD / ADHD Brother    Family Psychiatric  History: ADHD in a brother nothing else clearly reported Social History:  Social  History   Substance and Sexual Activity  Alcohol Use Not Currently   Alcohol/week: 0.0 standard drinks   Comment: "A little bit"      Social History   Substance and Sexual Activity  Drug Use Not Currently   Types: Marijuana   Comment: Last used: unknown     Social History   Socioeconomic History   Marital status: Single    Spouse name: Not on file   Number of children: 0   Years of education: 11th   Highest education level: Not on file  Occupational History   Occupation: unemployed    Employer: [employer]    Comment: Disbaled  Tobacco Use   Smoking status: Every Day    Packs/day: 0.25    Types: Cigarettes   Smokeless tobacco: Never   Tobacco comments:    2 cigarettes a day  Vaping Use   Vaping Use: Never used  Substance and Sexual Activity   Alcohol use: Not Currently    Alcohol/week: 0.0 standard drinks    Comment: "A little bit"    Drug use: Not Currently    Types: Marijuana    Comment: Last used: unknown    Sexual activity: Not on file  Other Topics Concern   Not on file  Social History Narrative   Patient lives at home with his mother.   Disabled.   Education 11 th grade .   Right handed.   Drinks caffeine occassionally   Social Determinants of Corporate investment banker Strain: Not on file  Food Insecurity: Not on file  Transportation Needs: Not on file  Physical Activity: Not on file  Stress: Not on file  Social Connections: Not on file    Hospital Course: See note above.  We made some medicine adjustment but only a little bit.  Medically he is stable.  He is not hostile or threatening and he understands he is going back to his group home and is agreeable to it.  Discharged back to his regular outpatient treatment.  I did try to get in touch with his outpatient doctor while he was here leaving a message for him but did not hear back.  Physical Findings: AIMS:  , ,  ,  ,    CIWA:    COWS:     Musculoskeletal: Strength & Muscle Tone:  within normal limits Gait & Station: normal Patient leans: N/A   Psychiatric Specialty Exam:  Presentation  General Appearance: Appropriate for Environment  Eye Contact:Good  Speech:Clear and Coherent  Speech Volume:Normal  Handedness:Right   Mood and Affect  Mood:Dysphoric  Affect:Congruent (somewhat odd)   Thought Process  Thought Processes:Disorganized (able to answer some questions appropriately)  Descriptions of Associations:Loose  Orientation:Partial  Thought Content:Other (comment) (able to answer some questions appropriately)  History of Schizophrenia/Schizoaffective disorder:Yes  Duration of Psychotic Symptoms:Greater than six months  Hallucinations:No data recorded Ideas of Reference:None  Suicidal Thoughts:No data recorded  Homicidal Thoughts:No data recorded  Sensorium  Memory:Recent Fair; Immediate Fair  Judgment:Impaired  Insight:Lacking   Executive Functions  Concentration:Poor  Attention Span:Poor  Recall:Poor  Fund of Knowledge:Poor  Language:Poor   Psychomotor Activity  Psychomotor Activity:No data recorded  Assets  Assets:Resilience; Physical Health; Social Support   Sleep  Sleep:No data recorded   Physical Exam: Physical Exam Vitals and nursing note reviewed.  Constitutional:      Appearance: Normal appearance.  HENT:     Head: Normocephalic and atraumatic.     Mouth/Throat:     Pharynx: Oropharynx is clear.  Eyes:     Pupils: Pupils are equal, round, and reactive to light.  Cardiovascular:     Rate and Rhythm: Normal rate and regular rhythm.  Pulmonary:     Effort: Pulmonary effort is normal.     Breath sounds: Normal breath sounds.  Abdominal:     General: Abdomen is flat.     Palpations: Abdomen is soft.  Musculoskeletal:        General: Normal range of motion.  Skin:    General: Skin is warm and dry.  Neurological:     General: No focal deficit present.     Mental Status: He is alert. Mental  status is at baseline.  Psychiatric:        Attention and Perception: He is inattentive.        Mood and Affect: Affect is labile and blunt.        Speech: Speech is tangential.        Behavior: Behavior is slowed.        Cognition and Memory: Cognition is impaired. Memory is impaired.   Review of Systems  Constitutional: Negative.   HENT: Negative.    Eyes: Negative.   Respiratory: Negative.    Cardiovascular: Negative.   Gastrointestinal: Negative.   Musculoskeletal: Negative.   Skin: Negative.   Neurological: Negative.   Psychiatric/Behavioral: Negative.    Blood pressure 124/65, pulse 88, temperature 98.5 F (36.9 C), temperature source Oral, resp. rate 18, height 6\' 2"  (1.88 m), weight (!) 142.9 kg, SpO2 100 %. Body mass index is 40.44 kg/m.   Social History   Tobacco Use  Smoking Status Every Day   Packs/day: 0.25   Types: Cigarettes  Smokeless Tobacco Never  Tobacco Comments   2 cigarettes a day   Tobacco Cessation:  A prescription for an FDA-approved tobacco cessation medication provided at discharge   Blood Alcohol level:  Lab Results  Component Value Date   [facility] <10 10/21/2021   ETH <10 09/30/2021    Metabolic Disorder Labs:  Lab Results  Component Value Date   HGBA1C 5.6 10/29/2021   MPG 114.02 10/29/2021   MPG 111.15 08/12/2021   Lab Results  Component Value Date   PROLACTIN 13.6 04/30/2020   Lab Results  Component Value Date   CHOL 152 10/29/2021   TRIG 196 (H) 10/29/2021   HDL 36 (L) 10/29/2021   CHOLHDL 4.2 10/29/2021   VLDL 39 10/29/2021   LDLCALC 77 10/29/2021   LDLCALC 90 08/12/2021    See Psychiatric Specialty Exam and Suicide Risk Assessment completed by Attending Physician prior to discharge.  Discharge destination:  Home  Is patient on multiple antipsychotic therapies at discharge:  Yes,   Do you recommend tapering to monotherapy for antipsychotics?  No   Has Patient had three or more failed trials of antipsychotic  monotherapy by history:  Yes,   Antipsychotic medications that previously failed include:  1.  Haloperidol., 2.  Olanzapine., and 3.  Clozapine.  Recommended Plan for Multiple Antipsychotic Therapies: NA  Discharge Instructions     Diet - low sodium heart healthy   Complete by: As directed    Increase activity slowly   Complete by: As directed       Allergies as of 10/31/2021   No Known Allergies      Medication List     STOP taking these medications    acetaminophen 500 MG tablet Commonly known as: TYLENOL   amantadine 100 MG capsule Commonly known as: SYMMETREL   ARIPiprazole 20 MG tablet Commonly known as: ABILIFY   Asenapine Maleate 10 MG Subl   cloZAPine 100 MG tablet Commonly known as: CLOZARIL   hydrOXYzine 25 MG tablet Commonly known as: ATARAX   ibuprofen 800 MG tablet Commonly known as: ADVIL   LORazepam 1 MG tablet Commonly known as: ATIVAN   metFORMIN 500 MG 24 hr tablet Commonly known as: GLUCOPHAGE-XR   multivitamin with minerals Tabs tablet       TAKE these medications      Indication  amLODipine 10 MG tablet Commonly known as: NORVASC Take 1 tablet (10 mg total) by mouth daily. Start taking on: November 01, 2021 What changed:  medication strength how much to take when to take this  Indication: High Blood Pressure Disorder   apixaban 5 MG Tabs tablet Commonly known as: Eliquis Take 1 tablet (5 mg total) by mouth 2 (two) times daily.  Indication: Blood Clot in a Deep Vein   benztropine 1 MG tablet Commonly known as: COGENTIN Take 1 tablet (1 mg total) by mouth 2 (two) times daily.  Indication: Extrapyramidal Reaction caused by Medications   divalproex 500 MG DR tablet Commonly known as: DEPAKOTE Take 1 tablet (500 mg total) by mouth every 12 (twelve) hours.  Indication: Schizophrenia   docusate sodium 100 MG capsule Commonly known as: COLACE Take 1 capsule (100 mg total) by mouth 2 (two) times daily. What changed:  how  much to take when to take this  Indication: Constipation   haloperidol 10 MG tablet Commonly known as: HALDOL Take 1 tablet (10 mg total) by mouth 2 (two) times daily.  Indication: Psychosis   loratadine 10 MG tablet Commonly known as: CLARITIN Take 1 tablet (10 mg total) by mouth daily as needed for allergies.  Indication: Hayfever   losartan 50 MG tablet Commonly known as: COZAAR Take 1 tablet (50 mg total) by mouth 2 (two) times daily.  Indication: High Blood Pressure Disorder   Lumigan 0.01 % Soln Generic drug: bimatoprost Place 1 drop into both eyes at bedtime.  Indication: Wide-Angle Glaucoma   nicotine 14 mg/24hr patch Commonly known as: NICODERM CQ - dosed in mg/24 hours Place 1 patch (14 mg total) onto the skin daily. Start taking on: November 01, 2021  Indication: Nicotine Addiction   OLANZapine 15 MG tablet Commonly known as: ZYPREXA Take 1 tablet (15 mg total) by mouth at bedtime. What changed:  medication strength how much to take  Indication: Schizophrenia   temazepam 15 MG capsule Commonly known as: RESTORIL Take 1 capsule (15 mg total) by mouth at bedtime.  Indication: Trouble Sleeping   topiramate 25 MG tablet Commonly known as: TOPAMAX Take 1 tablet (25 mg total) by mouth 2 (two) times daily.  Indication: Antipsychotic Therapy-Induced Weight Gain         Follow-up recommendations: Follow-up with outpatient psychiatrist  Comments: Prescriptions provided just for a month  as he has an active provider.  Signed: Mordecai Rasmussen, MD 10/31/2021, 9:56 AM

## 2021-10-31 NOTE — Group Note (Signed)
[facility] LCSW Group Therapy Note ? ? ?Group Date: 10/31/2021 ?Start Time: 1300 ?End Time: 1400 ? ? ?Type of Therapy/Topic:  Group Therapy:  Balance in Life ? ?Participation Level:  Did Not Attend  ? ?Description of Group:   ? This group will address the concept of balance and how it feels and looks when one is unbalanced. Patients will be encouraged to process areas in their lives that are out of balance, and identify reasons for remaining unbalanced. Facilitators will guide patients utilizing problem- solving interventions to address and correct the stressor making their life unbalanced. Understanding and applying boundaries will be explored and addressed for obtaining  and maintaining a balanced life. Patients will be encouraged to explore ways to assertively make their unbalanced needs known to significant others in their lives, using other group members and facilitator for support and feedback. ? ?Therapeutic Goals: ?Patient will identify two or more emotions or situations they have that consume much of in their lives. ?Patient will identify signs/triggers that life has become out of balance:  ?Patient will identify two ways to set boundaries in order to achieve balance in their lives:  ?Patient will demonstrate ability to communicate their needs through discussion and/or role plays ? ?Summary of Patient Progress: ? ? ? ?X ? ? ? ?Therapeutic Modalities:   ?Cognitive Behavioral Therapy ?Solution-Focused Therapy ?Assertiveness Training ? ? ?Cina Klumpp J Larraine Argo, LCSW ?

## 2021-11-01 MED ORDER — LOSARTAN POTASSIUM 50 MG PO TABS: 50.0000 mg | ORAL_TABLET | Freq: Two times a day (BID) | ORAL | 0 refills | Status: AC

## 2021-11-01 MED ORDER — DOCUSATE SODIUM 100 MG PO CAPS: 100.0000 mg | ORAL_CAPSULE | Freq: Two times a day (BID) | ORAL | 0 refills | Status: AC

## 2021-11-01 MED ORDER — TEMAZEPAM 15 MG PO CAPS: 15.0000 mg | ORAL_CAPSULE | Freq: Every day | ORAL | 0 refills | Status: DC

## 2021-11-01 MED ORDER — AMLODIPINE BESYLATE 10 MG PO TABS
10.0000 mg | ORAL_TABLET | Freq: Every day | ORAL | 0 refills | Status: DC
Start: 2021-11-01 — End: 2022-04-03

## 2021-11-01 MED ORDER — HALOPERIDOL 10 MG PO TABS: 10.0000 mg | ORAL_TABLET | Freq: Two times a day (BID) | ORAL | 0 refills | Status: DC

## 2021-11-01 MED ORDER — BENZTROPINE MESYLATE 1 MG PO TABS: 1.0000 mg | ORAL_TABLET | Freq: Two times a day (BID) | ORAL | 0 refills | Status: DC

## 2021-11-01 MED ORDER — DIVALPROEX SODIUM 500 MG PO DR TAB: 500.0000 mg | DELAYED_RELEASE_TABLET | Freq: Two times a day (BID) | ORAL | 0 refills | Status: DC

## 2021-11-01 MED ORDER — TOPIRAMATE 25 MG PO TABS: 25.0000 mg | ORAL_TABLET | Freq: Two times a day (BID) | ORAL | 0 refills | Status: DC

## 2021-11-01 MED ORDER — APIXABAN 5 MG PO TABS: 5.0000 mg | ORAL_TABLET | Freq: Two times a day (BID) | ORAL | 0 refills | Status: AC

## 2021-11-01 MED ORDER — NICOTINE 14 MG/24HR TD PT24: 14.0000 mg | MEDICATED_PATCH | Freq: Every day | TRANSDERMAL | 0 refills | Status: DC

## 2021-11-01 MED ORDER — LORATADINE 10 MG PO TABS: 10.0000 mg | ORAL_TABLET | Freq: Every day | ORAL | 0 refills | Status: AC | PRN

## 2021-11-01 MED ORDER — OLANZAPINE 15 MG PO TABS: 15.0000 mg | ORAL_TABLET | Freq: Every day | ORAL | 0 refills | Status: DC

## 2021-11-01 MED ORDER — LUMIGAN 0.01 % OP SOLN
1.0000 [drp] | Freq: Every day | OPHTHALMIC | 0 refills | Status: AC
Start: 2021-11-01 — End: ?

## 2021-11-01 NOTE — Plan of Care (Signed)
D: Pt alert and oriented. Pt denies experiencing any anxiety/depression at this time. Pt denies experiencing any pain at this time. Pt denies experiencing any SI/HI at this time. Pt observed responding to internal stimuli. Pt is walking down hallway laughing at nothing with no one else present. ? ?A: Scheduled medications administered to pt, per MD orders. Support and encouragement provided. Frequent verbal contact made. Routine safety checks conducted q15 minutes.  ? ?R: No adverse drug reactions noted. Pt verbally contracts for safety at this time. Pt compliant with medications and treatment plan. Pt interacts minimally with others on the unit. Pt remains safe at this time. Will continue to monitor.  ? ?Problem: Education: ?Goal: Emotional status will improve ?Outcome: Progressing ?  ?Problem: Coping: ?Goal: Ability to use eye contact when communicating with others will improve ?Outcome: Not Progressing ?  ?

## 2021-11-01 NOTE — Progress Notes (Signed)
Pt has been talking to himself most of the shift. He has been loud, laughing, and carrying on a dialogue with someone  was not there. He kept getting louder and louder during the night. He got an IM prn to help him control his outburst/agitation. ?

## 2021-11-01 NOTE — [facility] Suicide Risk Assessment (Signed)
[facility] Discharge Suicide Risk Assessment ? ? ?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HCC) ?Discharge Diagnoses: Principal Problem: ?  Schizophrenia, disorganized type (HCC) ?Active Problems: ?  Obesity, Class III, BMI 40-49.9 (morbid obesity) (HCC) ?  HTN (hypertension) ?  Multiple sclerosis (HCC) ? ? ?Total Time spent with patient: 20 minutes ? ?Musculoskeletal: ?Strength & Muscle Tone: within normal limits ?Gait & Station: normal ?Patient leans: N/A ? ?Psychiatric Specialty Exam ? ?Presentation  ?General Appearance: Appropriate for Environment ? ?Eye Contact:Good ? ?Speech:Clear and Coherent ? ?Speech Volume:Normal ? ?Handedness:Right ? ? ?Mood and Affect  ?Mood:Dysphoric ? ?Duration of Depression Symptoms: Greater than two weeks ? ?Affect:Congruent (somewhat odd) ? ? ?Thought Process  ?Thought Processes:Disorganized (able to answer some questions appropriately) ? ?Descriptions of Associations:Loose ? ?Orientation:Partial ? ?Thought Content:Other (comment) (able to answer some questions appropriately) ? ?History of Schizophrenia/Schizoaffective disorder:Yes ? ?Duration of Psychotic Symptoms:Greater than six months ? ?Hallucinations:No data recorded ?Ideas of Reference:None ? ?Suicidal Thoughts:No data recorded ?Homicidal Thoughts:No data recorded ? ?Sensorium  ?Memory:Recent Fair; Immediate Fair ? ?Judgment:Impaired ? ?Insight:Lacking ? ? ?Executive Functions  ?Concentration:Poor ? ?Attention Span:Poor ? ?Recall:Poor ? ?Fund of Knowledge:Poor ? ?Language:Poor ? ? ?Psychomotor Activity  ?Psychomotor Activity:No data recorded ? ?Assets  ?Assets:Resilience; Physical Health; Social Support ? ? ?Sleep  ?Sleep:No data recorded ? ?Physical Exam: ?Physical Exam ?Vitals and nursing note reviewed.  ?Constitutional:   ?   Appearance: Normal appearance.  ?HENT:  ?   Head: Normocephalic and atraumatic.  ?   Mouth/Throat:  ?   Pharynx: Oropharynx is clear.  ?Eyes:  ?   Pupils: Pupils are equal, round, and reactive to  light.  ?Cardiovascular:  ?   Rate and Rhythm: Normal rate and regular rhythm.  ?Pulmonary:  ?   Effort: Pulmonary effort is normal.  ?   Breath sounds: Normal breath sounds.  ?Abdominal:  ?   General: Abdomen is flat.  ?   Palpations: Abdomen is soft.  ?Musculoskeletal:     ?   General: Normal range of motion.  ?Skin: ?   General: Skin is warm and dry.  ?Neurological:  ?   General: No focal deficit present.  ?   Mental Status: He is alert. Mental status is at baseline.  ?Psychiatric:     ?   Attention and Perception: He is inattentive.     ?   Mood and Affect: Mood normal. Affect is labile.     ?   Speech: He is noncommunicative. Speech is tangential.     ?   Behavior: Behavior is cooperative.     ?   Cognition and Memory: Cognition is impaired. Memory is impaired.     ?   Judgment: Judgment is impulsive.  ? ?Review of Systems  ?Constitutional: Negative.   ?HENT: Negative.    ?Eyes: Negative.   ?Respiratory: Negative.    ?Cardiovascular: Negative.   ?Gastrointestinal: Negative.   ?Musculoskeletal: Negative.   ?Skin: Negative.   ?Neurological: Negative.   ?Psychiatric/Behavioral: Negative.    ?Blood pressure (!) 130/93, pulse (!) 103, temperature 97.9 ?F (36.6 ?C), temperature source Oral, resp. rate 18, height 6\' 2"  (1.88 m), weight (!) 142.9 kg, SpO2 100 %. Body mass index is 40.44 kg/m?. ? ?Mental Status Per Nursing Assessment::   ?On Admission:  NA ? ?Demographic Factors:  ?Male ? ?Loss Factors: ?NA ? ?Historical Factors: ?Impulsivity ? ?Risk Reduction Factors:   ?Positive social support and Positive therapeutic relationship ? ?Continued Clinical Symptoms:  ?Schizophrenia:   Less than 40  years old ?Medical Diagnoses and Treatments/Surgeries ? ?Cognitive Features That Contribute To Risk:  ?Loss of executive function   ? ?Suicide Risk:  ?Minimal: No identifiable suicidal ideation.  Patients presenting with no risk factors but with morbid ruminations; may be classified as minimal risk based on the severity of the  depressive symptoms ? ? Follow-up Information   ? ? [facility] (Dr. Jannifer Franklin). Go on 11/01/2021.   ?Why: Please prsent for scheduled appointment (VIRTUAL) on 13 Nov 2021 at 1130 AM. ?Contact information: ?[address] ?[place], [place] [address] ?Phone: [phone] ?FAX: [phone] ? ?  ?  ? ?  ?  ? ?  ? ? ?Plan Of Care/Follow-up recommendations:  ?Patient denies suicidal ideation and reports a good mood.  No sign of any intent or thought to harm himself.  Currently basically cooperative with treatment here.  No longer appears to require inpatient psychiatric treatment.  No evidence of representing a danger to himself.  Follow-up will be with his usual outpatient providers ? ?Mordecai Rasmussen, MD ?11/01/2021, 10:31 AM ?

## 2021-11-01 NOTE — Discharge Summary (Signed)
Physician Discharge Summary Note  Patient:  Brandon [name] is an 31 y.o., male MRN:  [id number] DOB:  May 23, 1991 Patient phone:  [phone] (home)  Patient address:   [address] [place] [address],  Total Time spent with patient: 20 minutes  Date of Admission:  10/28/2021 Date of Discharge: 11/01/2021  Reason for Admission: Admitted after being brought to the emergency room agitated and disorganized.  Spent some time in the ER before being admitted to the psychiatric unit.  Principal Problem: Schizophrenia, disorganized type [facility]) Discharge Diagnoses: Principal Problem:   Schizophrenia, disorganized type (HCC) Active Problems:   Obesity, Class III, BMI 40-49.9 (morbid obesity) (HCC)   HTN (hypertension)   Multiple sclerosis (HCC)   Past Psychiatric History: Longstanding history of schizophrenia diagnosis as well as a diagnosis of multiple sclerosis.  Longstanding chronic psychiatric problems resistant to treatment  Past Medical History:  Past Medical History:  Diagnosis Date   ADHD (attention deficit hyperactivity disorder)    Bipolar 1 disorder (HCC)    Chronic back pain    Chronic constipation    Chronic neck pain    Hypertension    Multiple sclerosis (HCC) 05/20/2013   left sided weakness, dysarthria   Non-compliance    Obesity    Pulmonary embolism (HCC)    Schizophrenia (HCC)    Stroke (HCC)    left sided deficits - pt's mother denies this    Past Surgical History:  Procedure Laterality Date   NO PAST SURGERIES     None     RADIOLOGY WITH ANESTHESIA N/A 01/16/2021   Procedure: MRI WITH ANESTHESIA CERVICAL AND THORASIC SPINE WITH AND WITHOUT CONTRAST;  Surgeon: Radiologist, Medication, MD;  Location: [facility] OR;  Service: Radiology;  Laterality: N/A;   TOOTH EXTRACTION N/A 06/24/2019   Procedure: DENTAL RESTORATION/EXTRACTION OF TEETH NUMBER ONE, SIXTEEN, SEVENTEEN, NINETEEN, THIRTY-TWO;  Surgeon: Ocie Doyne, DDS;  Location: [facility] OR;  Service: Oral Surgery;   Laterality: N/A;   Family History:  Family History  Problem Relation Age of Onset   Diabetes Mother    ADD / ADHD Brother    Family Psychiatric  History: See previous Social History:  Social History   Substance and Sexual Activity  Alcohol Use Not Currently   Alcohol/week: 0.0 standard drinks   Comment: "A little bit"      Social History   Substance and Sexual Activity  Drug Use Not Currently   Types: Marijuana   Comment: Last used: unknown     Social History   Socioeconomic History   Marital status: Single    Spouse name: Not on file   Number of children: 0   Years of education: 11th   Highest education level: Not on file  Occupational History   Occupation: unemployed    Employer: [employer]    Comment: Disbaled  Tobacco Use   Smoking status: Every Day    Packs/day: 0.25    Types: Cigarettes   Smokeless tobacco: Never   Tobacco comments:    2 cigarettes a day  Vaping Use   Vaping Use: Never used  Substance and Sexual Activity   Alcohol use: Not Currently    Alcohol/week: 0.0 standard drinks    Comment: "A little bit"    Drug use: Not Currently    Types: Marijuana    Comment: Last used: unknown    Sexual activity: Not on file  Other Topics Concern   Not on file  Social History Narrative   Patient lives  at home with his mother.   Disabled.   Education 11 th grade .   Right handed.   Drinks caffeine occassionally   Social Determinants of Corporate investment banker Strain: Not on file  Food Insecurity: Not on file  Transportation Needs: Not on file  Physical Activity: Not on file  Stress: Not on file  Social Connections: Not on file    Hospital Course: See previous discharge summaries.  Admitted to psychiatric unit.  Medicines slightly adjusted.  Patient did not display violence or aggression or any suicidal behavior and was basically cooperative with treatment.  We have recommended discharge back to his group home as he appears to be at his  baseline.  Mother is now in agreement with the plan.  Group home has not been returning her phone calls today but we continue to keep him under orders for discharge.  Physical Findings: AIMS:  , ,  ,  ,    CIWA:    COWS:     Musculoskeletal: Strength & Muscle Tone: within normal limits Gait & Station: normal Patient leans: N/A   Psychiatric Specialty Exam:  Presentation  General Appearance: Appropriate for Environment  Eye Contact:Good  Speech:Clear and Coherent  Speech Volume:Normal  Handedness:Right   Mood and Affect  Mood:Dysphoric  Affect:Congruent (somewhat odd)   Thought Process  Thought Processes:Disorganized (able to answer some questions appropriately)  Descriptions of Associations:Loose  Orientation:Partial  Thought Content:Other (comment) (able to answer some questions appropriately)  History of Schizophrenia/Schizoaffective disorder:Yes  Duration of Psychotic Symptoms:Greater than six months  Hallucinations:No data recorded Ideas of Reference:None  Suicidal Thoughts:No data recorded Homicidal Thoughts:No data recorded  Sensorium  Memory:Recent Fair; Immediate Fair  Judgment:Impaired  Insight:Lacking   Executive Functions  Concentration:Poor  Attention Span:Poor  Recall:Poor  Fund of Knowledge:Poor  Language:Poor   Psychomotor Activity  Psychomotor Activity:No data recorded  Assets  Assets:Resilience; Physical Health; Social Support   Sleep  Sleep:No data recorded   Physical Exam: Physical Exam Vitals and nursing note reviewed.  Constitutional:      Appearance: Normal appearance.  HENT:     Head: Normocephalic and atraumatic.     Mouth/Throat:     Pharynx: Oropharynx is clear.  Eyes:     Pupils: Pupils are equal, round, and reactive to light.  Cardiovascular:     Rate and Rhythm: Normal rate and regular rhythm.  Pulmonary:     Effort: Pulmonary effort is normal.     Breath sounds: Normal breath sounds.   Abdominal:     General: Abdomen is flat.     Palpations: Abdomen is soft.  Musculoskeletal:        General: Normal range of motion.  Skin:    General: Skin is warm and dry.  Neurological:     General: No focal deficit present.     Mental Status: He is alert. Mental status is at baseline.  Psychiatric:        Attention and Perception: He is inattentive.        Mood and Affect: Affect is labile.        Speech: He is noncommunicative. Speech is tangential.        Behavior: Behavior is cooperative.        Cognition and Memory: Cognition is impaired. Memory is impaired.        Judgment: Judgment is impulsive.   Review of Systems  Constitutional: Negative.   HENT: Negative.    Eyes: Negative.   Respiratory: Negative.  Cardiovascular: Negative.   Gastrointestinal: Negative.   Musculoskeletal: Negative.   Skin: Negative.   Neurological: Negative.   Psychiatric/Behavioral: Negative.    Blood pressure (!) 130/93, pulse (!) 103, temperature 97.9 F (36.6 C), temperature source Oral, resp. rate 18, height 6\' 2"  (1.88 m), weight (!) 142.9 kg, SpO2 100 %. Body mass index is 40.44 kg/m.   Social History   Tobacco Use  Smoking Status Every Day   Packs/day: 0.25   Types: Cigarettes  Smokeless Tobacco Never  Tobacco Comments   2 cigarettes a day   Tobacco Cessation:  A prescription for an FDA-approved tobacco cessation medication was offered at discharge and the patient refused   Blood Alcohol level:  Lab Results  Component Value Date   [facility] <10 10/21/2021   ETH <10 09/30/2021    Metabolic Disorder Labs:  Lab Results  Component Value Date   HGBA1C 5.6 10/29/2021   MPG 114.02 10/29/2021   MPG 111.15 08/12/2021   Lab Results  Component Value Date   PROLACTIN 13.6 04/30/2020   Lab Results  Component Value Date   CHOL 152 10/29/2021   TRIG 196 (H) 10/29/2021   HDL 36 (L) 10/29/2021   CHOLHDL 4.2 10/29/2021   VLDL 39 10/29/2021   LDLCALC 77 10/29/2021   LDLCALC 90  08/12/2021    See Psychiatric Specialty Exam and Suicide Risk Assessment completed by Attending Physician prior to discharge.  Discharge destination:  Home  Is patient on multiple antipsychotic therapies at discharge:  Yes,   Do you recommend tapering to monotherapy for antipsychotics?  No   Has Patient had three or more failed trials of antipsychotic monotherapy by history:  Yes,   Antipsychotic medications that previously failed include:   1.  Clozapine., 2.  Haldol., and 3.  Olanzapine.  Recommended Plan for Multiple Antipsychotic Therapies: NA  Discharge Instructions     Diet - low sodium heart healthy   Complete by: As directed    Increase activity slowly   Complete by: As directed       Allergies as of 11/01/2021   No Known Allergies      Medication List     STOP taking these medications    acetaminophen 500 MG tablet Commonly known as: TYLENOL   amantadine 100 MG capsule Commonly known as: SYMMETREL   ARIPiprazole 20 MG tablet Commonly known as: ABILIFY   Asenapine Maleate 10 MG Subl   cloZAPine 100 MG tablet Commonly known as: CLOZARIL   hydrOXYzine 25 MG tablet Commonly known as: ATARAX   ibuprofen 800 MG tablet Commonly known as: ADVIL   LORazepam 1 MG tablet Commonly known as: ATIVAN   metFORMIN 500 MG 24 hr tablet Commonly known as: GLUCOPHAGE-XR   multivitamin with minerals Tabs tablet       TAKE these medications      Indication  amLODipine 10 MG tablet Commonly known as: NORVASC Take 1 tablet (10 mg total) by mouth daily. What changed:  medication strength how much to take when to take this  Indication: High Blood Pressure Disorder   apixaban 5 MG Tabs tablet Commonly known as: Eliquis Take 1 tablet (5 mg total) by mouth 2 (two) times daily.  Indication: Blood Clot in a Deep Vein   benztropine 1 MG tablet Commonly known as: COGENTIN Take 1 tablet (1 mg total) by mouth 2 (two) times daily.  Indication: Extrapyramidal  Reaction caused by Medications   divalproex 500 MG DR tablet Commonly known as: DEPAKOTE Take 1  tablet (500 mg total) by mouth every 12 (twelve) hours.  Indication: Schizophrenia   docusate sodium 100 MG capsule Commonly known as: COLACE Take 1 capsule (100 mg total) by mouth 2 (two) times daily. What changed:  how much to take when to take this  Indication: Constipation   haloperidol 10 MG tablet Commonly known as: HALDOL Take 1 tablet (10 mg total) by mouth 2 (two) times daily.  Indication: Psychosis   loratadine 10 MG tablet Commonly known as: CLARITIN Take 1 tablet (10 mg total) by mouth daily as needed for allergies.  Indication: Hayfever   losartan 50 MG tablet Commonly known as: COZAAR Take 1 tablet (50 mg total) by mouth 2 (two) times daily.  Indication: High Blood Pressure Disorder   Lumigan 0.01 % Soln Generic drug: bimatoprost Place 1 drop into both eyes at bedtime.  Indication: Wide-Angle Glaucoma   nicotine 14 mg/24hr patch Commonly known as: NICODERM CQ - dosed in mg/24 hours Place 1 patch (14 mg total) onto the skin daily.  Indication: Nicotine Addiction   OLANZapine 15 MG tablet Commonly known as: ZYPREXA Take 1 tablet (15 mg total) by mouth at bedtime. What changed:  medication strength how much to take  Indication: Schizophrenia   temazepam 15 MG capsule Commonly known as: RESTORIL Take 1 capsule (15 mg total) by mouth at bedtime.  Indication: Trouble Sleeping   topiramate 25 MG tablet Commonly known as: TOPAMAX Take 1 tablet (25 mg total) by mouth 2 (two) times daily.  Indication: Antipsychotic Therapy-Induced Weight Gain        Follow-up Information     [facility] (Dr. Jannifer Franklin). Go on 11/01/2021.   Why: Please prsent for scheduled appointment (VIRTUAL) on 13 Nov 2021 at 1130 AM. Contact information: [address], [address] [place], [place] [address] Phone: [phone] FAX: [phone]                 Follow-up recommendations: Follow-up with local psychiatric provider continue residing in a group home.  Prescriptions will be sent to the appropriate pharmacy  Comments: Discharge still pending  Signed: Mordecai Rasmussen, MD 11/01/2021, 10:33 AM

## 2021-11-01 NOTE — Progress Notes (Signed)
?  [facility] Adult Case Management Discharge Plan : ? ?Will you be returning to the same living situation after discharge:  Yes,  Patient to return to group home.  ?At discharge, do you have transportation home?: Yes,  Group home owner Asa Lente to assist with transportation.  ?Do you have the ability to pay for your medications: Yes,  Medicare Part A and B.  ? ?Release of information consent forms completed and in the chart;  Patient's signature needed at discharge. ? ?Patient to Follow up at: ? Follow-up Information   ? ? [facility] (Dr. Jannifer Franklin). Go on 11/01/2021.   ?Why: Please prsent for scheduled appointment (VIRTUAL) on 13 Nov 2021 at 1130 AM. ?Contact information: ?[address] ?[place], [place] [address] ?Phone: [phone] ?FAX: [phone] ? ?  ?  ? ?  ?  ? ?  ? ? ?Next level of care provider has access to [facility] Link:no ? ?Safety Planning and Suicide Prevention discussed: Yes,  SPE completed with patient's guardian/mother [name] Puopolo.  ? ?Have you used any form of tobacco in the last 30 days? (Cigarettes, Smokeless Tobacco, Cigars, and/or Pipes): Yes ? ?Has patient been referred to the Quitline?: Patient refused referral ?Tobacco Use: High Risk  ? Smoking Tobacco Use: Every Day  ? Smokeless Tobacco Use: Never  ? Passive Exposure: Not on file  ? ? ?Patient has been referred for addiction treatment: N/A Patient denies active substance use, UDS Negative for all, screened low risk during nursing admission (see [facility] quick tab).  ? ?Corky Crafts, LCSWA ?11/01/2021, 8:49 AM ?

## 2021-11-01 NOTE — Progress Notes (Addendum)
[facility] LCSW Progress Note ?  ?11/01/2021    8:40 AM ? ?KANISHK STROEBEL  ? ?[id number]  ? ?Type of Contact and Topic:  Legal Guardian Contact  ? ?Legal guardian states she no longer would like to appeal the discharge with [facility]. Treatment team will proceed with discharge. Mother states she has called [facility] to cancel appeal on 11/01/2021 at approximately 1000 AM.  ? ? 11/01/21 0839  ?Legal Guardian  ?Does Patient Have a Automotive engineer Guardian? Yes  ?Legal Guardian Mother  ?Legal Guardian Contact Information Tyra Michelle, [phone]  ?Copy of Legal Guardianship Form in Chart Yes  ?Legal Guardian Notified of Arrival  Successfully notified  ?Legal Guardian Notified of Pending Discharge  Successfully notified  ? ?  ?Signed:  ?Corky Crafts, MSW, LCSWA, LCAS ?11/01/2021 8:40 AM ?  ?  ?

## 2021-11-01 NOTE — Progress Notes (Signed)
D: Pt alert and oriented. Pt denies experiencing any pain, SI/HI at this time. Pt reports he will be able to keep himself safe when he returns home.  ? ?A: Pt received discharge and medication education/information. Pt belongings were returned and signed for at this time to include printed prescriptions.  ? ?R: Pt verbalized understanding of discharge and medication education/information. ? ?Pt escorted by staff to medical mall front lobby where pt was picked up by group home staff.   ?

## 2021-11-16 ENCOUNTER — Emergency Department: Payer: Medicare Other

## 2021-11-16 ENCOUNTER — Emergency Department
Admission: EM | Admit: 2021-11-16 | Discharge: 2021-11-23 | Disposition: A | Discharge status: Home / Self Care | Payer: Medicare Other | Attending: Emergency Medicine | Admitting: Emergency Medicine

## 2021-11-16 ENCOUNTER — Other Ambulatory Visit: Payer: Self-pay

## 2021-11-16 DIAGNOSIS — F319 Bipolar disorder, unspecified: Secondary | ICD-10-CM

## 2021-11-16 DIAGNOSIS — R443 Hallucinations, unspecified: Secondary | ICD-10-CM

## 2021-11-16 DIAGNOSIS — Z23 Encounter for immunization: Secondary | ICD-10-CM | POA: Diagnosis not present

## 2021-11-16 DIAGNOSIS — Z20822 Contact with and (suspected) exposure to covid-19: Secondary | ICD-10-CM | POA: Diagnosis not present

## 2021-11-16 DIAGNOSIS — S61216A Laceration without foreign body of right little finger without damage to nail, initial encounter: Secondary | ICD-10-CM | POA: Insufficient documentation

## 2021-11-16 DIAGNOSIS — R4182 Altered mental status, unspecified: Secondary | ICD-10-CM | POA: Insufficient documentation

## 2021-11-16 DIAGNOSIS — W25XXXA Contact with sharp glass, initial encounter: Secondary | ICD-10-CM | POA: Insufficient documentation

## 2021-11-16 DIAGNOSIS — F201 Disorganized schizophrenia: Secondary | ICD-10-CM | POA: Insufficient documentation

## 2021-11-16 DIAGNOSIS — S6991XA Unspecified injury of right wrist, hand and finger(s), initial encounter: Secondary | ICD-10-CM | POA: Diagnosis present

## 2021-11-16 LAB — COMPREHENSIVE METABOLIC PANEL
ALT: 27 U/L (ref 0–44)
AST: 15 U/L (ref 15–41)
Albumin: 3.8 g/dL (ref 3.5–5.0)
Alkaline Phosphatase: 81 U/L (ref 38–126)
Anion gap: 9 (ref 5–15)
BUN: 15 mg/dL (ref 6–20)
CO2: 26 mmol/L (ref 22–32)
Calcium: 9.2 mg/dL (ref 8.9–10.3)
Chloride: 106 mmol/L (ref 98–111)
Creatinine, Ser: 1 mg/dL (ref 0.61–1.24)
GFR, Estimated: >60 mL/min (ref >60)
Glucose, Bld: 102 mg/dL — ABNORMAL HIGH (ref 70–99)
Potassium: 3.8 mmol/L (ref 3.5–5.1)
Sodium: 141 mmol/L (ref 135–145)
Total Bilirubin: 0.5 mg/dL (ref 0.3–1.2)
Total Protein: 6.8 g/dL (ref 6.5–8.1)

## 2021-11-16 LAB — SALICYLATE LEVEL: Salicylate Lvl: <7 mg/dL — ABNORMAL LOW (ref 7.0–30.0)

## 2021-11-16 LAB — CBC
HCT: 47.5 % (ref 39.0–52.0)
Hemoglobin: 14.3 g/dL (ref 13.0–17.0)
MCH: 24.7 pg — ABNORMAL LOW (ref 26.0–34.0)
MCHC: 30.1 g/dL (ref 30.0–36.0)
MCV: 82.2 fL (ref 80.0–100.0)
Platelets: 182 10*3/uL (ref 150–400)
RBC: 5.78 MIL/uL (ref 4.22–5.81)
RDW: 14.6 % (ref 11.5–15.5)
WBC: 7.1 10*3/uL (ref 4.0–10.5)
nRBC: 0 % (ref 0.0–0.2)

## 2021-11-16 LAB — RESP PANEL BY RT-PCR (FLU A&B, COVID) ARPGX2
Influenza A by PCR: NEGATIVE
Influenza B by PCR: NEGATIVE
SARS Coronavirus 2 by RT PCR: NEGATIVE

## 2021-11-16 LAB — ACETAMINOPHEN LEVEL: Acetaminophen (Tylenol), Serum: <10 ug/mL — ABNORMAL LOW (ref 10–30)

## 2021-11-16 LAB — ETHANOL: Alcohol, Ethyl (B): <10 mg/dL (ref <10)

## 2021-11-16 MED ORDER — TETANUS-DIPHTH-ACELL PERTUSSIS 5-2.5-18.5 LF-MCG/0.5 IM SUSY
0.5000 mL | PREFILLED_SYRINGE | Freq: Once | INTRAMUSCULAR | Status: AC
  Administered 2021-11-16: 0.5 mL via INTRAMUSCULAR
  Filled 2021-11-16: qty 0.5

## 2021-11-16 MED ORDER — CEPHALEXIN 500 MG PO CAPS
500.0000 mg | ORAL_CAPSULE | Freq: Two times a day (BID) | ORAL | 0 refills | Status: AC
Start: 2021-11-16 — End: 2021-11-21

## 2021-11-16 NOTE — BH Assessment (Signed)
Comprehensive Clinical Assessment (CCA) Note ? ?11/16/2021 ?Brandon [name] ?[id number] ? ?Chief Complaint: Patient is a 31 year old male presenting to [facility] ED voluntarily. Per triage note Pt arrives with EMS from group home.  Pt is experiencing auditory hallucinations and has cut his hand on a glass window. During assessment patient appears alert and oriented, calm and cooperative. Patient can be seen standing in his room staring out his door but is not aggressive. When asked if patient is experiencing AH/VH patient denies. ? ?Collateral information provided by Group Home Manger Mr. Ladene Artist [phone] who reports "He was having a outburst, I guess he took his frustration out on the front window pane and he had bleeding going on so I called the paramedic, the police also came and they made the decision to bring him to you guys." Group home manager Mr. Ladene Artist reports that patient has been taking his medications since being back at the group home.  ?Chief Complaint  ?Patient presents with  ? Psychiatric Evaluation  ? ?Visit Diagnosis: Schizophrenia  ? ? ?CCA Screening, Triage and Referral (STR) ? ?Patient Reported Information ?How did you hear about [place]? Other (Comment) ? ?Referral name: No data recorded ?Referral phone number: No data recorded ? ?Whom do you see for routine medical problems? No data recorded ?Practice/Facility Name: No data recorded ?Practice/Facility Phone Number: No data recorded ?Name of Contact: No data recorded ?Contact Number: No data recorded ?Contact Fax Number: No data recorded ?Prescriber Name: No data recorded ?Prescriber Address (if known): No data recorded ? ?What Is the Reason for Your Visit/Call Today? Patient presents voluntarily due to group home staff reporting that patient is hallucinating ? ?How Long Has This Been Causing You Problems? > than 6 months ? ?What Do You Feel Would Help You the Most Today? Treatment for Depression or other mood problem ? ? ?Have You Recently Been in  Any Inpatient Treatment (Hospital/Detox/Crisis Center/28-Day Program)? No data recorded ?Name/Location of Program/Hospital:No data recorded ?How Long Were You There? No data recorded ?When Were You Discharged? No data recorded ? ?Have You Ever Received Services From [facility] Before? No data recorded ?Who Do You See at [facility]? No data recorded ? ?Have You Recently Had Any Thoughts About Hurting Yourself? No ? ?Are You Planning to Commit Suicide/Harm Yourself At This time? No ? ? ?Have you Recently Had Thoughts About Hurting Someone [name]? No ? ?Explanation: No data recorded ? ?Have You Used Any Alcohol or Drugs in the Past 24 Hours? No ? ?How Long Ago Did You Use Drugs or Alcohol? No data recorded ?What Did You Use and How Much? No data recorded ? ?Do You Currently Have a Therapist/Psychiatrist? Yes ? ?Name of Therapist/Psychiatrist: [facility] ? ? ?Have You Been Recently Discharged From Any Office Practice or Programs? No ? ?Explanation of Discharge From Practice/Program: No data recorded ? ?  ?CCA Screening Triage Referral Assessment ?Type of Contact: Face-to-Face ? ?Is this Initial or Reassessment? Initial Assessment ? ?Date Telepsych consult ordered in [facility]:  05/28/21 ? ?Time Telepsych consult ordered in [facility]:  No data recorded ? ?Patient Reported Information Reviewed? No data recorded ?Patient Left Without Being Seen? No data recorded ?Reason for Not Completing Assessment: No data recorded ? ?Collateral Involvement: Shaughn Thomley, mother/legal guardian: [phone] ? ? ?Does Patient Have a Automotive engineer Guardian? No data recorded ?Name and Contact of Legal Guardian: No data recorded ?If Minor and Not Living with Parent(s), Who has Custody? n/a ? ?Is CPS involved or ever  been involved? Never ? ?Is APS involved or ever been involved? Never ? ? ?Patient Determined To Be At Risk for Harm To Self or Others Based on Review of Patient Reported Information or Presenting Complaint? No ? ?Method: No data  recorded ?Availability of Means: No data recorded ?Intent: No data recorded ?Notification Required: No data recorded ?Additional Information for Danger to Others Potential: No data recorded ?Additional Comments for Danger to Others Potential: No data recorded ?Are There Guns or Other Weapons in Your Home? No data recorded ?Types of Guns/Weapons: No data recorded ?Are These Weapons Safely Secured?                            No data recorded ?Who Could Verify You Are Able To Have These Secured: No data recorded ?Do You Have any Outstanding Charges, Pending Court Dates, Parole/Probation? No data recorded ?Contacted To Inform of Risk of Harm To Self or Others: -- (N/A) ? ? ?Location of Assessment: [facility] ED ? ? ?Does Patient Present under Involuntary Commitment? No ? ?IVC Papers Initial File Date: 10/21/21 ? ? ?[place] of Residence: [place] ? ? ?Patient Currently Receiving the Following Services: Group Home; Medication Management ? ? ?Determination of Need: Emergent (2 hours) ? ? ?Options For Referral: ED Visit; Medication Management; Therapeutic Triage Services ? ? ? ? ?CCA Biopsychosocial ?Intake/Chief Complaint:  No data recorded ?Current Symptoms/Problems: No data recorded ? ?Patient Reported Schizophrenia/Schizoaffective Diagnosis in Past: Yes ? ? ?Strengths: Pt is, overall, cooperative. He is appropriately dressed. He has consistent housing. ? ?Preferences: No data recorded ?Abilities: No data recorded ? ?Type of Services Patient Feels are Needed: No data recorded ? ?Initial Clinical Notes/Concerns: No data recorded ? ?Mental Health Symptoms ?Depression:   ?None ?  ?Duration of Depressive symptoms:  ?Greater than two weeks ?  ?Mania:   ?Racing thoughts ?  ?Anxiety:    ?Difficulty concentrating; Irritability; Restlessness ?  ?Psychosis:   ?Grossly disorganized or catatonic behavior; Hallucinations; Delusions ?  ?Duration of Psychotic symptoms:  ?Greater than six months ?  ?Trauma:   ?N/A ?  ?Obsessions:   ?N/A ?   ?Compulsions:   ?N/A ?  ?Inattention:   ?N/A ?  ?Hyperactivity/Impulsivity:   ?N/A ?  ?Oppositional/Defiant Behaviors:   ?Aggression towards people/animals; Temper; Intentionally annoying; Defies rules ?  ?Emotional Irregularity:   ?Mood lability; Potentially harmful impulsivity ?  ?Other Mood/Personality Symptoms:   ?None noted ?  ? ?Mental Status Exam ?Appearance and self-care  ?Stature:   ?Tall ?  ?Weight:   ?Overweight ?  ?Clothing:   ?Casual; Age-appropriate ?  ?Grooming:   ?Normal ?  ?Cosmetic use:   ?None ?  ?Posture/gait:   ?Normal ?  ?Motor activity:   ?Slowed ?  ?Sensorium  ?Attention:   ?Distractible ?  ?Concentration:   ?Scattered ?  ?Orientation:   ?Person ?  ?Recall/memory:   ?Defective in Short-term; Defective in Recent ?  ?Affect and Mood  ?Affect:   ?Blunted ?  ?Mood:   ?Anxious ?  ?Relating  ?Eye contact:   ?Normal ?  ?Facial expression:   ?Responsive ?  ?Attitude toward examiner:   ?Cooperative ?  ?Thought and Language  ?Speech flow:  ?Garbled; Paucity; Slurred; Articulation error; Pressured ?  ?Thought content:   ?Appropriate to Mood and Circumstances ?  ?Preoccupation:   ?None ?  ?Hallucinations:   ?Auditory; Visual ?  ?Organization:  No data recorded  ?Executive Functions  ?Fund of Knowledge:   ?  Poor ?  ?Intelligence:   ?Needs investigation ?  ?Abstraction:   ?Abstract ?  ?Judgement:   ?Poor ?  ?Reality Testing:   ?Adequate ?  ?Insight:   ?Lacking ?  ?Decision Making:   ?Impulsive ?  ?Social Functioning  ?Social Maturity:   ?Impulsive ?  ?Social Judgement:   ?Heedless; Naive ?  ?Stress  ?Stressors:   ?Family conflict; Illness ?  ?Coping Ability:   ?Overwhelmed ?  ?Skill Deficits:   ?Activities of daily living; Decision making; Self-control; Responsibility; Intellect/education ?  ?Supports:   ?Family; Friends/Service system ?  ? ? ?Religion: ?Religion/Spirituality ?Are You A Religious Person?: No ?How Might This Affect Treatment?: Not assessed ? ?Leisure/Recreation: ?Leisure / Recreation ?Do  You Have Hobbies?: Yes ?Leisure and Hobbies: Video games ? ?Exercise/Diet: ?Exercise/Diet ?Do You Exercise?: No ?Have You Gained or Lost A Significant Amount of Weight in the Past Six Months?: No ?Do You Follow a

## 2021-11-16 NOTE — ED Triage Notes (Signed)
Pt arrives with EMS from group home.  Pt is experiencing auditory hallucinations and has cut his hand on a glass window.  ?

## 2021-11-16 NOTE — Discharge Instructions (Addendum)
We have started him on some antibiotic to help with infection but there is no evidence of any fractures.   You have been seen in the Emergency Department (ED) today for a psychiatric complaint.  You have been evaluated by psychiatry and we believe you are safe to be discharged from the hospital.    Please return to the ED immediately if you have ANY thoughts of hurting yourself or anyone else, so that we may help you.  Please avoid alcohol and drug use.  Follow up with your doctor and/or therapist as soon as possible regarding today's ED visit.   Please follow up any other recommendations and clinic appointments provided by the psychiatry team that saw you in the Emergency Department.

## 2021-11-16 NOTE — ED Notes (Addendum)
Patient's legal guardian Brandon [name] [phone]) made aware patient is in the ER.  Pt's mother states he lives at [facility].   ?

## 2021-11-16 NOTE — ED Notes (Signed)
Pt dressed out this Clinical research associate, Personnel officer EDT.  Belongings include: ? ?Striped shirt ?Jeans ?Boxers ?Black socks ?Sneaker ? ?1 belongings bag labeled ? ? ?

## 2021-11-16 NOTE — ED Notes (Signed)
Pt cooperative with staff.  Inappropriate laughing at times.  Pt given dinner tray.  ?

## 2021-11-16 NOTE — ED Provider Notes (Signed)
? ?[facility] ?Provider Note ? ? ? Event Date/Time  ? First MD Initiated Contact with Patient 11/16/21 1717   ?  (approximate) ? ? ?History  ? ?Psychiatric Evaluation ? ? ?HPI ? ?Brandon [name] is a 31 y.o. male with schizophrenia who comes in with concerns for aggression.  Patient reportedly punched a glass window and has some bleeding and swelling noted to his right pinky finger.  He also was reportedly having worsening hallucinations.  Patient himself is denying any SI or HI.  Patient is from a group home. ? ? ?I reviewed the records were patient was discharged from psychiatry on 4/24 his schizophrenia causing agitation admission organization ? ? ?Physical Exam  ? ?Triage Vital Signs: ?ED Triage Vitals [11/16/21 1713]  ?Enc Vitals Group  ?   BP 124/87  ?   Pulse Rate 74  ?   Resp 14  ?   Temp 98.2 ?F (36.8 ?C)  ?   Temp src   ?   SpO2 99 %  ?   Weight   ?   Height   ?   Head Circumference   ?   Peak Flow   ?   Pain Score   ?   Pain Loc   ?   Pain Edu?   ?   Excl. in [place]?   ? ? ?Most recent vital signs: ?Vitals:  ? 11/16/21 1713  ?BP: 124/87  ?Pulse: 74  ?Resp: 14  ?Temp: 98.2 ?F (36.8 ?C)  ?SpO2: 99%  ? ? ? ?General: Awake, no distress.  ?CV:  Good peripheral perfusion.  ?Resp:  Normal effort.  ?Abd:  No distention.  ?Other:  Patient is some dried blood noted on his right pinky.  With a little bit of tenderness along the digit.  No redness.  Small triangle shaped laceration. ?Denies SI or HI ? ? ?ED Results / Procedures / Treatments  ? ?Labs ?(all labs ordered are listed, but only abnormal results are displayed) ?Labs Reviewed  ?COMPREHENSIVE METABOLIC PANEL - Abnormal; Notable for the following components:  ?    Result Value  ? Glucose, Bld 102 (*)   ? All other components within normal limits  ?SALICYLATE LEVEL - Abnormal; Notable for the following components:  ? Salicylate Lvl [id number] (*)   ? All other components within normal limits  ?ACETAMINOPHEN LEVEL - Abnormal; Notable for the  following components:  ? Acetaminophen (Tylenol), Serum <10 (*)   ? All other components within normal limits  ?CBC - Abnormal; Notable for the following components:  ? MCH 24.7 (*)   ? All other components within normal limits  ?RESP PANEL BY RT-PCR (FLU A&B, COVID) [id number]  ?ETHANOL  ?URINE DRUG SCREEN, QUALITATIVE ([facility] ONLY)  ? ? ? ?RADIOLOGY ?I have reviewed the xray personally interpreted as no  ?evidence of fracture ? ? ?PROCEDURES: ? ?Critical Care performed: No ? ?Marland Kitchen.Laceration Repair ? ?Date/Time: 11/16/2021 7:17 PM ?Performed by: Vanessa [place], MD ?Authorized by: Vanessa [place], MD  ? ?Consent:  ?  Consent obtained:  Verbal ?  Consent given by:  Patient ?  Risks discussed:  Infection, need for additional repair and nerve damage ?  Alternatives discussed:  No treatment ?Universal protocol:  ?  Patient identity confirmed:  Verbally with patient ?Laceration details:  ?  Location:  Finger ?  Finger location:  R small finger ?  Length (cm):  1 ?Exploration:  ?  Hemostasis achieved with:  Direct pressure ?Treatment:  ?  Area cleansed with:  Saline ?Skin repair:  ?  Repair method:  Tissue adhesive ?Approximation:  ?  Approximation:  Close ?Repair type:  ?  Repair type:  Simple ?Comments:  ?   Small triange skin tear and dermabond placed over it. ? ? ?MEDICATIONS ORDERED IN ED: ?Medications  ?Tdap (BOOSTRIX) injection 0.5 mL (has no administration in time range)  ? ? ? ?IMPRESSION / MDM / ASSESSMENT AND PLAN / ED COURSE  ?I reviewed the triage vital signs and the nursing notes. ? ?We will get x-ray to evaluate for any kind of fracture and update patient that since tetanus-we will washout the wound and there was evidence of small triangle shaped laceration that I placed dermabond on-  We will give some Keflex to help prevent infection ? ? Will order psychiatric screening labs and discuss further w/ psychiatric service. ? ?CBC normal CMP normal alcohol negative Tylenol, salicylate negative ? ?D/d includes but is not  limited to psychiatric disease, behavioral/personality disorder, inadequate socioeconomic support, medical. ? ?Based on HPI, exam, unremarkable labs, no concern for acute medical problem at this time. No rigidity, clonus, hyperthermia, focal neurologic deficit, diaphoresis, tachycardia, meningismus, ataxia, gait abnormality or other finding to suggest this visit represents a non-psychiatric problem. Screening labs reviewed.   ? ?Given this, pt medically cleared, to be dispositioned per Psych. ? ? ? ?The patient has been placed in psychiatric observation due to the need to provide a safe environment for the patient while obtaining psychiatric consultation and evaluation, as well as ongoing medical and medication management to treat the patient's condition.  The patient has not been placed under full IVC at this time.  ? ? ? ? ? ?The patient is on the cardiac monitor to evaluate for evidence of arrhythmia and/or significant heart rate changes. ? ?  ? ? ?FINAL CLINICAL IMPRESSION(S) / ED DIAGNOSES  ? ?Final diagnoses:  ?Hallucinations  ?Injury of finger of right hand, initial encounter  ? ? ? ?Rx / DC Orders  ? ?ED Discharge Orders   ? ?      Ordered  ?  cephALEXin (KEFLEX) 500 MG capsule  2 times daily       ? 11/16/21 1919  ? ?  ?  ? ?  ? ? ? ?Note:  This document was prepared using Dragon voice recognition software and may include unintentional dictation errors. ?  ?Vanessa [place], MD ?11/16/21 1920 ? ?

## 2021-11-16 NOTE — ED Notes (Signed)
Patient resting quietly in room.  Calm and cooperative at this time ?

## 2021-11-17 DIAGNOSIS — F201 Disorganized schizophrenia: Secondary | ICD-10-CM | POA: Diagnosis not present

## 2021-11-17 DIAGNOSIS — S61216A Laceration without foreign body of right little finger without damage to nail, initial encounter: Secondary | ICD-10-CM | POA: Diagnosis not present

## 2021-11-17 MED ORDER — OLANZAPINE 5 MG PO TABS
15.0000 mg | ORAL_TABLET | Freq: Every day | ORAL | Status: DC
  Administered 2021-11-17 – 2021-11-22 (×6): 15 mg via ORAL
  Filled 2021-11-17 (×6): qty 1

## 2021-11-17 MED ORDER — ZIPRASIDONE MESYLATE 20 MG IM SOLR
20.0000 mg | Freq: Once | INTRAMUSCULAR | Status: DC
Start: 2021-11-17 — End: 2021-11-23
  Filled 2021-11-17: qty 20

## 2021-11-17 MED ORDER — TEMAZEPAM 15 MG PO CAPS
15.0000 mg | ORAL_CAPSULE | Freq: Every day | ORAL | Status: DC
  Administered 2021-11-17 – 2021-11-22 (×6): 15 mg via ORAL
  Filled 2021-11-17 (×6): qty 1

## 2021-11-17 MED ORDER — DIVALPROEX SODIUM 500 MG PO DR TAB
500.0000 mg | DELAYED_RELEASE_TABLET | Freq: Two times a day (BID) | ORAL | Status: DC
  Administered 2021-11-17 – 2021-11-23 (×12): 500 mg via ORAL
  Filled 2021-11-17 (×13): qty 1

## 2021-11-17 MED ORDER — NICOTINE 14 MG/24HR TD PT24
14.0000 mg | MEDICATED_PATCH | Freq: Every day | TRANSDERMAL | Status: DC
  Administered 2021-11-17 – 2021-11-22 (×6): 14 mg via TRANSDERMAL
  Filled 2021-11-17 (×6): qty 1

## 2021-11-17 MED ORDER — HALOPERIDOL 5 MG PO TABS
10.0000 mg | ORAL_TABLET | Freq: Two times a day (BID) | ORAL | Status: DC
  Administered 2021-11-17 – 2021-11-23 (×11): 10 mg via ORAL
  Filled 2021-11-17 (×13): qty 2

## 2021-11-17 MED ORDER — LATANOPROST 0.005 % OP SOLN
1.0000 [drp] | Freq: Every day | OPHTHALMIC | Status: DC
  Administered 2021-11-17 – 2021-11-22 (×5): 1 [drp] via OPHTHALMIC
  Filled 2021-11-17 (×2): qty 2.5

## 2021-11-17 MED ORDER — DOCUSATE SODIUM 100 MG PO CAPS
100.0000 mg | ORAL_CAPSULE | Freq: Two times a day (BID) | ORAL | Status: DC
  Administered 2021-11-17 – 2021-11-23 (×13): 100 mg via ORAL
  Filled 2021-11-17 (×13): qty 1

## 2021-11-17 MED ORDER — AMLODIPINE BESYLATE 5 MG PO TABS
10.0000 mg | ORAL_TABLET | Freq: Every day | ORAL | Status: DC
  Administered 2021-11-17 – 2021-11-23 (×7): 10 mg via ORAL
  Filled 2021-11-17 (×7): qty 2

## 2021-11-17 MED ORDER — LORATADINE 10 MG PO TABS
10.0000 mg | ORAL_TABLET | Freq: Every day | ORAL | Status: DC | PRN
  Administered 2021-11-23: 10 mg via ORAL
  Filled 2021-11-17: qty 1

## 2021-11-17 MED ORDER — APIXABAN 5 MG PO TABS
5.0000 mg | ORAL_TABLET | Freq: Two times a day (BID) | ORAL | Status: DC
  Administered 2021-11-17 – 2021-11-23 (×13): 5 mg via ORAL
  Filled 2021-11-17 (×13): qty 1

## 2021-11-17 NOTE — ED Notes (Signed)
VOL/pending psych consult 

## 2021-11-17 NOTE — ED Notes (Signed)
Pt. Is awake, breakfast was given, with a drink, also vitals have been updated.  ?

## 2021-11-17 NOTE — ED Notes (Signed)
Pt is in room talking loudly to? Per staff this is usual behavior.  ?

## 2021-11-17 NOTE — TOC Progression Note (Addendum)
Transition of Care (TOC) - Progression Note  ? ? ?Patient Details  ?Name: Brandon [name] ?MRN: [id number] ?Date of Birth: 19-Sep-1990 ? ?Transition of Care (TOC) CM/SW Contact  ?Jayln Branscom L Montrice Gracey, LCSWA ?Phone Number: ?11/17/2021, 4:35 PM ? ?Clinical Narrative:    ? ?CSW consulted with Behavioral Health staff. They stated that the group home reported that the patient return to the current group home placement upon discharge whenever they finish fixing the window. Per Behavioral Health note, the group home has 60 day noticed in place. The patient is due to leave/discharge from the home by June 17th, 2023. The patient's legal guardian is working to find placement for after that date.  ?  ?No TOC needs at this time.  ?  ? ?Expected Discharge Plan and Services ?  ?  ?  ?  ?  ?                ?  ?  ?  ?  ?  ?  ?  ?  ?  ?  ? ? ?Social Determinants of Health (SDOH) Interventions ?  ? ?Readmission Risk Interventions ?   ? View : No data to display.  ?  ?  ?  ? ? ?

## 2021-11-17 NOTE — ED Notes (Signed)
Sheets changed, pt declines to change into new clean scrubs, pt declines to have teeth brushed or face washed. Pt declines to have hands washed. Enocuraged pt to allow rn to provide hygiene but pt continues to decline, room cleansed.  ?

## 2021-11-17 NOTE — ED Notes (Signed)
Pt given dinner tray and drink at this time. 

## 2021-11-17 NOTE — ED Notes (Signed)
Pt given graham crackers per request.  

## 2021-11-17 NOTE — ED Notes (Signed)
VOL/Pending Placement 

## 2021-11-17 NOTE — BH Assessment (Signed)
8:33am-Writer spoke with patient to complete updated/ reassessment. Patient states she was doing well. He was calm and cooperative during the interview. He denies SI/HI and AV/H. However, writer witnessed the patient responding to internal stimuli. ? ?12:14pm-Per the Group Home owner ([phone]), the patient became upset following a phone call with his mother. He was supposed to go home to celebrate Mother's Day with his mother, but he didn't. As a result, he became upset and broke the glass in the door. He also threatened to harm him self, staff an another resident. Owner states, he doesn't feel comfortable with the patient returning at this time because he have to repair the window. "We can't house residents with broken glass and stuff." He is in the process of renewing the house license's and expecting the state to come and complete a site visit. He want to get it finished before they get there.Per the owner, the patient has broken things in the home on a weekly basis. He further reports, the patient isn't discharged from the home at this time but he does have 60 day noticed in place. He is due to leave/discharge from the home by June 17th, 2023. ?

## 2021-11-17 NOTE — ED Provider Notes (Signed)
Emergency Medicine Observation Re-evaluation Note ? ?ADHIRAJ YUILL is a 31 y.o. male, seen on rounds today.  Pt initially presented to the ED for complaints of Psychiatric Evaluation ?Currently, the patient is calm, resting. ? ?Physical Exam  ?BP 113/90 (BP Location: Left Arm)   Pulse 72   Temp 98.4 ?F (36.9 ?C) (Oral)   Resp 20   Ht 6\' 2"  (1.88 m)   Wt 131 kg   SpO2 98%   BMI 37.08 kg/m?  ? ? ?ED Course / MDM  ?EKG:  ? ?I have reviewed the labs performed to date as well as medications administered while in observation.  Recent changes in the last 24 hours include None. ? ?Plan  ?Current plan is for psych disposition. ? ORVIS RUDELL is not under involuntary commitment. ? ? ?  ?Duffy Bruce, MD ?11/17/21 2013 ? ?

## 2021-11-17 NOTE — Consult Note (Signed)
[facility] Psych ED Progress Note ? ?11/17/2021 1:11 PM ?Brandon [name]  ?MRN:  [id number] ? ? ?Method of visit?: Face to Face , 2:10 PM ? ?Subjective:  When asked how he is doing  today, patient states "I'm ok. ' Patient is observed to be eating lunch. He correctly identifies the food items on his tray as he continues to eat during the reassessment. Patient is unable to identify his reasons for presenting to the hospital, however he does note it was an accident. " I didn't mean to break those things." Chart review shows patient broke some glass and threatened staff after being denied a visit on Mothers day.  Patient has remained calm yesterday and today. No behavioral outburst; calm, polite. Writer asked patient if he took his medications at group home, he responded that he does "yes."  Patient has been taking medications as prescribed in the hospital. Behavior since presentation to the ED is improved.He is making good eye contact, responds more appropriately to questions. No aggression. No expression of suicidal thoughts. ? ?Patient remains psychiatrically cleared at this time, however he is under to return to the group home at this home. See note by SW. Suspect large degree of property destruction and dangerous to others at this time.   He denies SI/HI/AVH.  ? ?   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 ?Total Time spent with patient: 15 minutes ? ?Past Psychiatric History: see prior ? ?Past Medical History:  ?Past Medical History:  ?Diagnosis Date  ? ADHD (attention deficit hyperactivity disorder)   ? Bipolar 1 disorder ([place])   ? Chronic back pain   ? Chronic constipation   ? Chronic neck pain   ? Hypertension   ? Multiple sclerosis ([place]) 05/20/2013  ? left sided weakness, dysarthria  ? Non-compliance   ? Obesity   ? Pulmonary embolism ([place])   ? Schizophrenia ([place])   ? Stroke [facility])   ? left sided deficits - pt's mother denies  this  ?  ?Past Surgical History:  ?Procedure Laterality Date  ? NO PAST SURGERIES    ? None    ? RADIOLOGY WITH ANESTHESIA N/A 01/16/2021  ? Procedure: MRI WITH ANESTHESIA CERVICAL AND THORASIC SPINE WITH AND WITHOUT CONTRAST;  Surgeon: Radiologist, Medication, MD;  Location: [place];  Service: Radiology;  Laterality: N/A;  ? TOOTH EXTRACTION N/A 06/24/2019  ? Procedure: DENTAL RESTORATION/EXTRACTION OF TEETH NUMBER ONE, SIXTEEN, SEVENTEEN, NINETEEN, THIRTY-TWO;  Surgeon: Diona Browner, DDS;  Location: [place];  Service: Oral Surgery;  Laterality: N/A;  ? ?Family History:  ?Family History  ?Problem Relation Age of Onset  ? Diabetes Mother   ? ADD / ADHD Brother   ? ?Family Psychiatric  History: unknown ?Social History:  ?Social History  ? ?Substance and Sexual Activity  ?Alcohol Use Not Currently  ? Alcohol/week: 0.0 standard drinks  ? Comment: "A little bit"   ?   ?Social History  ? ?Substance and Sexual Activity  ?Drug Use Not Currently  ? Types: Marijuana  ? Comment: Last used: unknown   ?  ?Social History  ? ?Socioeconomic History  ? Marital status: Single  ?  Spouse name: Not on file  ? Number of children: 0  ? Years of education: 11th  ? Highest education level: Not on file  ?Occupational History  ? Occupation: unemployed  ?  Employer: [employer]  ?  Comment: Disbaled  ?Tobacco Use  ? Smoking  status: Every Day  ?  Packs/day: 0.25  ?  Types: Cigarettes  ? Smokeless tobacco: Never  ? Tobacco comments:  ?  2 cigarettes a day  ?Vaping Use  ? Vaping Use: Never used  ?Substance and Sexual Activity  ? Alcohol use: Not Currently  ?  Alcohol/week: 0.0 standard drinks  ?  Comment: "A little bit"   ? Drug use: Not Currently  ?  Types: Marijuana  ?  Comment: Last used: unknown   ? Sexual activity: Not on file  ?Other Topics Concern  ? Not on file  ?Social History Narrative  ? Patient lives at home with his mother.  ? Disabled.  ? Education 11 th grade .  ? Right handed.  ? Drinks caffeine occassionally  ? ?Social  Determinants of Health  ? ?Financial Resource Strain: Not on file  ?Food Insecurity: Not on file  ?Transportation Needs: Not on file  ?Physical Activity: Not on file  ?Stress: Not on file  ?Social Connections: Not on file  ? ? ?Sleep: Good ? ?Appetite:  Good ? ?Current Medications: ?Current Facility-Administered Medications  ?Medication Dose Route Frequency Provider Last Rate Last Admin  ? amLODipine (NORVASC) tablet 10 mg  10 mg Oral Daily Lucrezia Starch, MD   10 mg at 11/17/21 0932  ? apixaban (ELIQUIS) tablet 5 mg  5 mg Oral BID Lucrezia Starch, MD   5 mg at 11/17/21 [id number]  ? divalproex (DEPAKOTE) DR tablet 500 mg  500 mg Oral Q12H Lucrezia Starch, MD   500 mg at 11/17/21 0931  ? docusate sodium (COLACE) capsule 100 mg  100 mg Oral BID Lucrezia Starch, MD   100 mg at 11/17/21 [id number]  ? haloperidol (HALDOL) tablet 10 mg  10 mg Oral BID Lucrezia Starch, MD   10 mg at 11/17/21 [id number]  ? latanoprost (XALATAN) 0.005 % ophthalmic solution 1 drop  1 drop Both Eyes QHS Lucrezia Starch, MD      ? loratadine (CLARITIN) tablet 10 mg  10 mg Oral Daily PRN Lucrezia Starch, MD      ? nicotine (NICODERM CQ - dosed in mg/24 hours) patch 14 mg  14 mg Transdermal Daily Lucrezia Starch, MD   14 mg at 11/17/21 0932  ? OLANZapine (ZYPREXA) tablet 15 mg  15 mg Oral QHS Lucrezia Starch, MD      ? temazepam (RESTORIL) capsule 15 mg  15 mg Oral QHS Lucrezia Starch, MD      ? ziprasidone (GEODON) injection 20 mg  20 mg Intramuscular Once Lucrezia Starch, MD      ? ?Current Outpatient Medications  ?Medication Sig Dispense Refill  ? cephALEXin (KEFLEX) 500 MG capsule Take 1 capsule (500 mg total) by mouth 2 (two) times daily for 5 days. 10 capsule 0  ? amLODipine (NORVASC) 10 MG tablet Take 1 tablet (10 mg total) by mouth daily. 30 tablet 0  ? apixaban (ELIQUIS) 5 MG TABS tablet Take 1 tablet (5 mg total) by mouth 2 (two) times daily. 60 tablet 0  ? benztropine (COGENTIN) 1 MG tablet Take 1 tablet (1 mg total) by mouth 2 (two) times  daily. 60 tablet 0  ? divalproex (DEPAKOTE) 500 MG DR tablet Take 1 tablet (500 mg total) by mouth every 12 (twelve) hours. 60 tablet 0  ? docusate sodium (COLACE) 100 MG capsule Take 1 capsule (100 mg total) by mouth 2 (two) times daily. 60 capsule 0  ? haloperidol (  HALDOL) 10 MG tablet Take 1 tablet (10 mg total) by mouth 2 (two) times daily. 60 tablet 0  ? loratadine (CLARITIN) 10 MG tablet Take 1 tablet (10 mg total) by mouth daily as needed for allergies. 30 tablet 0  ? losartan (COZAAR) 50 MG tablet Take 1 tablet (50 mg total) by mouth 2 (two) times daily. 60 tablet 0  ? LUMIGAN 0.01 % SOLN Place 1 drop into both eyes at bedtime. 2.5 mL 0  ? nicotine (NICODERM CQ - DOSED IN MG/24 HOURS) 14 mg/24hr patch Place 1 patch (14 mg total) onto the skin daily. 28 patch 0  ? OLANZapine (ZYPREXA) 15 MG tablet Take 1 tablet (15 mg total) by mouth at bedtime. 30 tablet 0  ? temazepam (RESTORIL) 15 MG capsule Take 1 capsule (15 mg total) by mouth at bedtime. 30 capsule 0  ? topiramate (TOPAMAX) 25 MG tablet Take 1 tablet (25 mg total) by mouth 2 (two) times daily. 60 tablet 0  ? ? ?Lab Results:  ?Results for orders placed or performed during the hospital encounter of 11/16/21 (from the past 48 hour(s))  ?Comprehensive metabolic panel     Status: Abnormal  ? Collection Time: 11/16/21  5:37 PM  ?Result Value Ref Range  ? Sodium 141 135 - 145 mmol/L  ? Potassium 3.8 3.5 - 5.1 mmol/L  ? Chloride 106 98 - 111 mmol/L  ? CO2 26 22 - 32 mmol/L  ? Glucose, Bld 102 (H) 70 - 99 mg/dL  ?  Comment: Glucose reference range applies only to samples taken after fasting for at least 8 hours.  ? BUN 15 6 - 20 mg/dL  ? Creatinine, Ser 1.00 0.61 - 1.24 mg/dL  ? Calcium 9.2 8.9 - 10.3 mg/dL  ? Total Protein 6.8 6.5 - 8.1 g/dL  ? Albumin 3.8 3.5 - 5.0 g/dL  ? AST 15 15 - 41 U/L  ? ALT 27 0 - 44 U/L  ? Alkaline Phosphatase 81 38 - 126 U/L  ? Total Bilirubin 0.5 0.3 - 1.2 mg/dL  ? GFR, Estimated >60 >60 mL/min  ?  Comment: (NOTE) ?Calculated using  the CKD-EPI Creatinine Equation (2021) ?  ? Anion gap 9 5 - 15  ?  Comment: Performed at [facility], [address], [place], [place] [address]  ?Ethanol     Status: None  ? Collection Time: 05/1

## 2021-11-18 DIAGNOSIS — S61216A Laceration without foreign body of right little finger without damage to nail, initial encounter: Secondary | ICD-10-CM | POA: Diagnosis not present

## 2021-11-18 NOTE — TOC Progression Note (Signed)
Transition of Care (TOC) - Progression Note  ? ? ?Patient Details  ?Name: Brandon [name] ?MRN: [id number] ?Date of Birth: [birth year] ? ?Transition of Care (TOC) CM/SW Contact  ?Shelbie Hutching, RN ?Phone Number: ?11/18/2021, 2:18 PM ? ?Clinical Narrative:    ? ?Called and left a message for [facility] and Owner [phone].  Called the group home directly and they say that I need to speak with Timmy to find out when patient can return.  Patient is cleared by psychiatry - updated patient's mother as well who is not happy that patient is being sent back to the group home.  She said she wanted to appeal that decision- informed the guardian that she can't appeal as the patient is not admitted, he was only evaluated in the ED.   ? ?  ?  ? ?Expected Discharge Plan and Services ?  ?  ?  ?  ?  ?                ?  ?  ?  ?  ?  ?  ?  ?  ?  ?  ? ? ?Social Determinants of Health (SDOH) Interventions ?  ? ?Readmission Risk Interventions ?   ? View : No data to display.  ?  ?  ?  ? ? ?

## 2021-11-18 NOTE — ED Notes (Signed)
Breakfast tray given. °

## 2021-11-18 NOTE — ED Notes (Signed)
VOL  PENDING  PLACEMENT 

## 2021-11-18 NOTE — TOC Progression Note (Signed)
Transition of Care (TOC) - Progression Note  ? ? ?Patient Details  ?Name: Brandon [name] ?MRN: [id number] ?Date of Birth: 11/26/1990 ? ?Transition of Care (TOC) CM/SW Contact  ?Allayne Butcher, RN ?Phone Number: ?11/18/2021, 2:28 PM ? ?Clinical Narrative:    ?Timmy the owner of [employer] reports that they could not get a repairman out until Thursday to fix the 2 windows that the patient broke.  He reports that once the windows are fixed patient can return.   ? ? ?  ?  ? ?Expected Discharge Plan and Services ?  ?  ?  ?  ?  ?                ?  ?  ?  ?  ?  ?  ?  ?  ?  ?  ? ? ?Social Determinants of Health (SDOH) Interventions ?  ? ?Readmission Risk Interventions ?   ? View : No data to display.  ?  ?  ?  ? ? ?

## 2021-11-18 NOTE — ED Provider Notes (Signed)
Emergency Medicine Observation Re-evaluation Note ? ?Brandon [name] is a 31 y.o. male, seen on rounds today.  Pt initially presented to the ED for complaints of Psychiatric Evaluation ?Currently, the patient is resting. ? ?Physical Exam  ?BP 125/73 (BP Location: Right Arm)   Pulse 67   Temp (!) 97.1 ?F (36.2 ?C) (Oral)   Resp 16   Ht 1.88 m (6\' 2" )   Wt 131 kg   SpO2 99%   BMI 37.08 kg/m?  ?Physical Exam ?Gen:  No acute distress ?Resp:  Breathing easily and comfortably, no accessory muscle usage ?Neuro:  Moving all four extremities, no gross focal neuro deficits ?Psych:  Resting currently, frequently agitated when awake ? ?ED Course / MDM  ?EKG:  ? ?I have reviewed the labs performed to date as well as medications administered while in observation.  Recent changes in the last 24 hours include evaluation. ? ?Plan  ?Current plan is for return to his group home "when the window gets fixed".  They have given 60-day notice but the current plan, according to psychiatry and TOC notes, is for him to go back as soon as the window is fixed and then his legal guardian will work on placement after he is discharged from his group home in June. ? ? Brandon [name] is not under involuntary commitment. ? ? ?  ?[name], MD ?11/18/21 11/20/21 ? ?

## 2021-11-18 NOTE — ED Notes (Signed)
Report to andrea, rn ?

## 2021-11-18 NOTE — ED Notes (Signed)
Nicotine patch removed, falling off ?

## 2021-11-18 NOTE — ED Notes (Signed)
Pt has urinated on self. Sheets changed, pt cleansed, new scrubs placed on pt.  ?

## 2021-11-18 NOTE — ED Notes (Signed)
Dinner tray given

## 2021-11-18 NOTE — ED Notes (Signed)
Lunch tray provided. 

## 2021-11-19 ENCOUNTER — Emergency Department: Payer: Medicare Other

## 2021-11-19 DIAGNOSIS — F201 Disorganized schizophrenia: Secondary | ICD-10-CM | POA: Diagnosis not present

## 2021-11-19 DIAGNOSIS — S61216A Laceration without foreign body of right little finger without damage to nail, initial encounter: Secondary | ICD-10-CM | POA: Diagnosis not present

## 2021-11-19 LAB — CBC WITH DIFFERENTIAL/PLATELET
Abs Immature Granulocytes: 0.04 10*3/uL (ref 0.00–0.07)
Basophils Absolute: 0 10*3/uL (ref 0.0–0.1)
Basophils Relative: 1 %
Eosinophils Absolute: 0.2 10*3/uL (ref 0.0–0.5)
Eosinophils Relative: 3 %
HCT: 48.8 % (ref 39.0–52.0)
Hemoglobin: 14.6 g/dL (ref 13.0–17.0)
Immature Granulocytes: 1 %
Lymphocytes Relative: 26 %
Lymphs Abs: 1.6 10*3/uL (ref 0.7–4.0)
MCH: 24.9 pg — ABNORMAL LOW (ref 26.0–34.0)
MCHC: 29.9 g/dL — ABNORMAL LOW (ref 30.0–36.0)
MCV: 83.1 fL (ref 80.0–100.0)
Monocytes Absolute: 0.4 10*3/uL (ref 0.1–1.0)
Monocytes Relative: 6 %
Neutro Abs: 3.9 10*3/uL (ref 1.7–7.7)
Neutrophils Relative %: 63 %
Platelets: 175 10*3/uL (ref 150–400)
RBC: 5.87 MIL/uL — ABNORMAL HIGH (ref 4.22–5.81)
RDW: 14.6 % (ref 11.5–15.5)
WBC: 6.2 10*3/uL (ref 4.0–10.5)
nRBC: 0 % (ref 0.0–0.2)

## 2021-11-19 LAB — TROPONIN I (HIGH SENSITIVITY): Troponin I (High Sensitivity): 4 ng/L (ref <18)

## 2021-11-19 LAB — VALPROIC ACID LEVEL: Valproic Acid Lvl: 46 ug/mL — ABNORMAL LOW (ref 50.0–100.0)

## 2021-11-19 LAB — COMPREHENSIVE METABOLIC PANEL
ALT: 29 U/L (ref 0–44)
AST: 16 U/L (ref 15–41)
Albumin: 3.5 g/dL (ref 3.5–5.0)
Alkaline Phosphatase: 75 U/L (ref 38–126)
Anion gap: 7 (ref 5–15)
BUN: 14 mg/dL (ref 6–20)
CO2: 29 mmol/L (ref 22–32)
Calcium: 9.1 mg/dL (ref 8.9–10.3)
Chloride: 105 mmol/L (ref 98–111)
Creatinine, Ser: 1.09 mg/dL (ref 0.61–1.24)
GFR, Estimated: >60 mL/min (ref >60)
Glucose, Bld: 111 mg/dL — ABNORMAL HIGH (ref 70–99)
Potassium: 4.1 mmol/L (ref 3.5–5.1)
Sodium: 141 mmol/L (ref 135–145)
Total Bilirubin: 0.6 mg/dL (ref 0.3–1.2)
Total Protein: 6.3 g/dL — ABNORMAL LOW (ref 6.5–8.1)

## 2021-11-19 NOTE — ED Notes (Signed)
Pt up at door without calling for assistance. Pt gait is unsteady. NT redirected to bed at this time. Pt given blankets and is resting comfortably. Pt reminded to call for assistance before getting up.  ?

## 2021-11-19 NOTE — ED Notes (Signed)
Pt is resting comfortably with no acute distress. ?

## 2021-11-19 NOTE — ED Notes (Signed)
Pt awake. RN in to assess patient, take vitals and provide breakfast. Pt unable to sit up in the bed without assistance. Pt then falling back down in bed, unable to sit up by himself. Pt found to be covered in urine. Pt requires 2 person with max assist out of bed and into recliner. Pericare performed, bed cleaned and linens changed. Pt states that he feels tired and his lower back hurts. Will notify EDP and psych regarding medications and further orders. ?Ate 100% of breakfast tray and 8oz juice. ?

## 2021-11-19 NOTE — ED Notes (Signed)
Given nighttime snack ?

## 2021-11-19 NOTE — ED Notes (Signed)
Pt to CT

## 2021-11-19 NOTE — ED Notes (Signed)
Notified both EDP and psych NP regarding pt most recent presentation. ?

## 2021-11-19 NOTE — ED Notes (Signed)
Attempted to call both numbers provided by Lynnda Child, [phone] legal guardian to get [facility] Both numbers, [facility] [employer] [phone] and [facility]a [facility] [phone] with no answer ?

## 2021-11-19 NOTE — ED Provider Notes (Signed)
Patient seems to be very groggy today.  Was eating this morning and the nurse went back to check on him this him lying backwards on the bed set up.  Also urinated all over everything.  Now he is awake and alert can sit stand by himself but is still very groggy.  Brief neuro exam is nonfocal cranial nerves look normal motor strength is normal finger-nose is normal/awake and alert he is kind of groggy.  Like if he took 100 mg of Benadryl or something similar.  He is very slow moving and so I will get a Depakote level and check his labs and hearing CT his head as well. ?  ?Arnaldo Natal, MD ?11/19/21 1143 ? ?

## 2021-11-19 NOTE — ED Notes (Signed)
VOLUNTARY still awaiting dispo by TOC ?

## 2021-11-19 NOTE — ED Provider Notes (Signed)
Emergency Medicine Observation Re-evaluation Note ? ?Brandon [name] is a 31 y.o. male, seen on rounds today.  Pt initially presented to the ED for complaints of Psychiatric Evaluation ?Currently, the patient is resting, voices no medical complaints. ? ?Physical Exam  ?BP 126/80 (BP Location: Left Arm)   Pulse 81   Temp 98.4 ?F (36.9 ?C) (Oral)   Resp 20   Ht 6\' 2"  (1.88 m)   Wt 131 kg   SpO2 99%   BMI 37.08 kg/m?  ?Physical Exam ?General: Resting in no acute distress ?Cardiac: No cyanosis ?Lungs: Equal rise and fall ?Psych: Not agitated ? ?ED Course / MDM  ?EKG:  ? ?I have reviewed the labs performed to date as well as medications administered while in observation.  Recent changes in the last 24 hours include no events overnight. ? ?Plan  ?Current plan is for psychiatric disposition. ? Brandon [name] is not under involuntary commitment. ? ? ?  ?Paulette Blanch, MD ?11/19/21 0532 ? ?

## 2021-11-19 NOTE — ED Notes (Signed)
Pt given dinner tray.

## 2021-11-19 NOTE — ED Notes (Signed)
Hospital meal provided.  100% consumed, pt tolerated w/o complaints.  Waste discarded appropriately.   

## 2021-11-19 NOTE — ED Notes (Signed)
Contacted legal guardian, Chayim Bialas, [phone] to get group home number. RN attempting to get most recent [facility] for psych NP ?

## 2021-11-19 NOTE — ED Notes (Signed)
Patient provided snack at appropriate snack time.  Pt consumed 100% of snack provided, tolerated well w/o complaints   Trash disposted of appropriately by patient.  

## 2021-11-19 NOTE — ED Notes (Signed)
Breakfast placed at bedside, pt sleeping. 

## 2021-11-19 NOTE — Consult Note (Signed)
[facility] Psych ED Progress Note  11/19/2021 12:16 PM Brandon [name]  MRN:  [id number]   Method of visit?: Face to Face   Subjective:  Writer notified by bedside RN that patient is groggy today and urinated on himself this morning.  Patient reports "I feel weak."  Patient is making good eye contact.  He seems close to baseline, but he is definitely moving a little slower.  He is able to stand and take a patient that has.  We will hold his antipsychotics for now and get Depakote level (Depakote level came back at 46).  EDP, Dr. Rip Harbour, he is doing further testing to rule out any organic cause.    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[place])  Total Time spent with patient: 15 minutes  Past Psychiatric History: See prior  Past Medical History:  Past Medical History:  Diagnosis Date   ADHD (attention deficit hyperactivity disorder)    Bipolar 1 disorder (HCC)    Chronic back pain    Chronic constipation    Chronic neck pain    Hypertension    Multiple sclerosis ([place]) 05/20/2013   left sided weakness, dysarthria   Non-compliance    Obesity    Pulmonary embolism ([place])    Schizophrenia (HCC)    Stroke ([place])    left sided deficits - pt's mother denies this    Past Surgical History:  Procedure Laterality Date   NO PAST SURGERIES     None     RADIOLOGY WITH ANESTHESIA N/A 01/16/2021   Procedure: MRI WITH ANESTHESIA CERVICAL AND THORASIC SPINE WITH AND WITHOUT CONTRAST;  Surgeon: Radiologist, Medication, MD;  Location: [place];  Service: Radiology;  Laterality: N/A;   TOOTH EXTRACTION N/A 06/24/2019   Procedure: DENTAL RESTORATION/EXTRACTION OF TEETH NUMBER ONE, SIXTEEN, SEVENTEEN, NINETEEN, THIRTY-TWO;  Surgeon: Diona Browner, DDS;  Location: [place];  Service: Oral Surgery;  Laterality: N/A;   Family History:  Family History  Problem Relation Age of Onset   Diabetes Mother    ADD / ADHD Brother     Family Psychiatric  History: See prior Social History:  Social History   Substance and Sexual Activity  Alcohol Use Not Currently   Alcohol/week: 0.0 standard drinks   Comment: "A little bit"      Social History   Substance and Sexual Activity  Drug Use Not Currently   Types: Marijuana   Comment: Last used: unknown     Social History   Socioeconomic History   Marital status: Single    Spouse name: Not on file   Number of children: 0   Years of education: 11th   Highest education level: Not on file  Occupational History   Occupation: unemployed    Employer: [employer]    Comment: Disbaled  Tobacco Use   Smoking status: Every Day    Packs/day: 0.25    Types: Cigarettes   Smokeless tobacco: Never   Tobacco comments:    2 cigarettes a day  Vaping Use   Vaping Use: Never used  Substance and Sexual Activity   Alcohol use: Not Currently    Alcohol/week: 0.0 standard drinks    Comment: "A little bit"    Drug use: Not Currently    Types: Marijuana    Comment: Last used: unknown    Sexual activity: Not on file  Other Topics Concern   Not on file  Social History Narrative   Patient lives  at home with his mother.   Disabled.   Education 11 th grade .   Right handed.   Drinks caffeine occassionally   Social Determinants of Radio broadcast assistant Strain: Not on file  Food Insecurity: Not on file  Transportation Needs: Not on file  Physical Activity: Not on file  Stress: Not on file  Social Connections: Not on file    Sleep: Good  Appetite:  Good  Current Medications: Current Facility-Administered Medications  Medication Dose Route Frequency Provider Last Rate Last Admin   amLODipine (NORVASC) tablet 10 mg  10 mg Oral Daily Lucrezia Starch, MD   10 mg at 11/19/21 1056   apixaban (ELIQUIS) tablet 5 mg  5 mg Oral BID Lucrezia Starch, MD   5 mg at 11/19/21 1057   divalproex (DEPAKOTE) DR tablet 500 mg  500 mg Oral Q12H Lucrezia Starch, MD   500  mg at 11/18/21 2114   docusate sodium (COLACE) capsule 100 mg  100 mg Oral BID Lucrezia Starch, MD   100 mg at 11/19/21 1057   haloperidol (HALDOL) tablet 10 mg  10 mg Oral BID Lucrezia Starch, MD   10 mg at 11/18/21 2116   latanoprost (XALATAN) 0.005 % ophthalmic solution 1 drop  1 drop Both Eyes QHS Lucrezia Starch, MD   1 drop at 11/18/21 2153   loratadine (CLARITIN) tablet 10 mg  10 mg Oral Daily PRN Lucrezia Starch, MD       nicotine (NICODERM CQ - dosed in mg/24 hours) patch 14 mg  14 mg Transdermal Daily Lucrezia Starch, MD   14 mg at 11/19/21 1058   OLANZapine (ZYPREXA) tablet 15 mg  15 mg Oral QHS Lucrezia Starch, MD   15 mg at 11/18/21 2115   temazepam (RESTORIL) capsule 15 mg  15 mg Oral QHS Lucrezia Starch, MD   15 mg at 11/18/21 2115   ziprasidone (GEODON) injection 20 mg  20 mg Intramuscular Once Lucrezia Starch, MD       Current Outpatient Medications  Medication Sig Dispense Refill   amantadine (SYMMETREL) 100 MG capsule Take 100 mg by mouth 2 (two) times daily.     cephALEXin (KEFLEX) 500 MG capsule Take 1 capsule (500 mg total) by mouth 2 (two) times daily for 5 days. 10 capsule 0   metFORMIN (GLUCOPHAGE-XR) 500 MG 24 hr tablet Take 500 mg by mouth 2 (two) times daily as needed.     amLODipine (NORVASC) 10 MG tablet Take 1 tablet (10 mg total) by mouth daily. 30 tablet 0   apixaban (ELIQUIS) 5 MG TABS tablet Take 1 tablet (5 mg total) by mouth 2 (two) times daily. 60 tablet 0   benztropine (COGENTIN) 1 MG tablet Take 1 tablet (1 mg total) by mouth 2 (two) times daily. 60 tablet 0   divalproex (DEPAKOTE) 500 MG DR tablet Take 1 tablet (500 mg total) by mouth every 12 (twelve) hours. 60 tablet 0   docusate sodium (COLACE) 100 MG capsule Take 1 capsule (100 mg total) by mouth 2 (two) times daily. 60 capsule 0   haloperidol (HALDOL) 10 MG tablet Take 1 tablet (10 mg total) by mouth 2 (two) times daily. 60 tablet 0   loratadine (CLARITIN) 10 MG tablet Take 1 tablet (10 mg  total) by mouth daily as needed for allergies. 30 tablet 0   losartan (COZAAR) 50 MG tablet Take 1 tablet (50 mg total) by mouth 2 (  two) times daily. 60 tablet 0   LUMIGAN 0.01 % SOLN Place 1 drop into both eyes at bedtime. 2.5 mL 0   nicotine (NICODERM CQ - DOSED IN MG/24 HOURS) 14 mg/24hr patch Place 1 patch (14 mg total) onto the skin daily. 28 patch 0   OLANZapine (ZYPREXA) 15 MG tablet Take 1 tablet (15 mg total) by mouth at bedtime. 30 tablet 0   temazepam (RESTORIL) 15 MG capsule Take 1 capsule (15 mg total) by mouth at bedtime. 30 capsule 0   topiramate (TOPAMAX) 25 MG tablet Take 1 tablet (25 mg total) by mouth 2 (two) times daily. 60 tablet 0    Lab Results:  Results for orders placed or performed during the hospital encounter of 11/16/21 (from the past 48 hour(s))  CBC with Differential     Status: Abnormal   Collection Time: 11/19/21 11:43 AM  Result Value Ref Range   WBC 6.2 4.0 - 10.5 K/uL   RBC 5.87 (H) 4.22 - 5.81 MIL/uL   Hemoglobin 14.6 13.0 - 17.0 g/dL   HCT 48.8 39.0 - 52.0 %   MCV 83.1 80.0 - 100.0 fL   MCH 24.9 (L) 26.0 - 34.0 pg   MCHC 29.9 (L) 30.0 - 36.0 g/dL   RDW 14.6 11.5 - 15.5 %   Platelets 175 150 - 400 K/uL   nRBC 0.0 0.0 - 0.2 %   Neutrophils Relative % 63 %   Neutro Abs 3.9 1.7 - 7.7 K/uL   Lymphocytes Relative 26 %   Lymphs Abs 1.6 0.7 - 4.0 K/uL   Monocytes Relative 6 %   Monocytes Absolute 0.4 0.1 - 1.0 K/uL   Eosinophils Relative 3 %   Eosinophils Absolute 0.2 0.0 - 0.5 K/uL   Basophils Relative 1 %   Basophils Absolute 0.0 0.0 - 0.1 K/uL   Immature Granulocytes 1 %   Abs Immature Granulocytes 0.04 0.00 - 0.07 K/uL    Comment: Performed at [facility], [address] [place], [place] [address]    Blood Alcohol level:  Lab Results  Component Value Date   [facility] <10 11/16/2021   ETH <10 10/21/2021    Physical Findings: AIMS:  , ,  ,  ,    CIWA:    COWS:     Musculoskeletal: Strength & Muscle Tone: within normal  limits Gait & Station: normal Patient leans: N/A  Psychiatric Specialty Exam:  Presentation  General Appearance: Appropriate for Environment; Casual  Eye Contact:Fair  Speech:Clear and Coherent; Normal Rate  Speech Volume:Normal  Handedness:Right   Mood and Affect  Mood:Euthymic  Affect:Congruent   Thought Process  Thought Processes:Linear; Irrevelant  Descriptions of Associations:Intact  Orientation:Partial  Thought Content:Logical  History of Schizophrenia/Schizoaffective disorder:Yes  Duration of Psychotic Symptoms:Greater than six months  Hallucinations:No data recorded Ideas of Reference:None  Suicidal Thoughts:No data recorded Homicidal Thoughts:No data recorded  Sensorium  Memory:Immediate Poor; Recent Poor; Remote Poor  Judgment:Impaired  Insight:Lacking   Executive Functions  Concentration:Poor  Attention Span:Poor  Recall:Poor  Fund of Knowledge:Poor  Language:Poor   Psychomotor Activity  Psychomotor Activity:No data recorded  Assets  Assets:Resilience; Physical Health; Social Support   Sleep  Sleep:No data recorded   Physical Exam: Physical Exam ROS Blood pressure 124/72, pulse 85, temperature 98 F (36.7 C), temperature source Oral, resp. rate 16, height 6\' 2"  (1.88 m), weight 131 kg, SpO2 98 %. Body mass index is 37.08 kg/m.  Treatment Plan Summary: Daily contact with patient to assess and evaluate symptoms  and progress in treatment, Medication management, and Plan with home antipsychotic medication for this morning until testing is back.  May reinitiate at lower doses.  Reviewed with EDP and Dr. Weber Cooks.  Patient is due to return to his group home on Thursday, 5/18.  Sherlon Handing, NP 11/19/2021, 12:16 PM

## 2021-11-20 DIAGNOSIS — S61216A Laceration without foreign body of right little finger without damage to nail, initial encounter: Secondary | ICD-10-CM | POA: Diagnosis not present

## 2021-11-20 DIAGNOSIS — F201 Disorganized schizophrenia: Secondary | ICD-10-CM | POA: Diagnosis not present

## 2021-11-20 LAB — URINE DRUG SCREEN, QUALITATIVE (ARMC ONLY)
Amphetamines, Ur Screen: NOT DETECTED
Barbiturates, Ur Screen: NOT DETECTED
Benzodiazepine, Ur Scrn: POSITIVE — AB
Cannabinoid 50 Ng, Ur ~~LOC~~: NOT DETECTED
Cocaine Metabolite,Ur ~~LOC~~: NOT DETECTED
MDMA (Ecstasy)Ur Screen: NOT DETECTED
Methadone Scn, Ur: NOT DETECTED
Opiate, Ur Screen: NOT DETECTED
Phencyclidine (PCP) Ur S: NOT DETECTED
Tricyclic, Ur Screen: NOT DETECTED

## 2021-11-20 LAB — URINALYSIS, ROUTINE W REFLEX MICROSCOPIC
Bilirubin Urine: NEGATIVE
Glucose, UA: 150 mg/dL — AB
Hgb urine dipstick: NEGATIVE
Ketones, ur: NEGATIVE mg/dL
Leukocytes,Ua: NEGATIVE
Nitrite: NEGATIVE
Protein, ur: NEGATIVE mg/dL
Specific Gravity, Urine: 1.024 (ref 1.005–1.030)
pH: 7 (ref 5.0–8.0)

## 2021-11-20 LAB — RESP PANEL BY RT-PCR (FLU A&B, COVID) ARPGX2
Influenza A by PCR: NEGATIVE
Influenza B by PCR: NEGATIVE
SARS Coronavirus 2 by RT PCR: NEGATIVE

## 2021-11-20 NOTE — ED Notes (Signed)
Meal tray given 

## 2021-11-20 NOTE — ED Notes (Signed)
Patient was given a sandwhich tray, sprite and ice cream. ?

## 2021-11-20 NOTE — ED Notes (Signed)
Lunch tray and drink given.  

## 2021-11-20 NOTE — ED Notes (Signed)
VOl/Waiting on repairs to be completed to return home ?

## 2021-11-20 NOTE — ED Notes (Signed)
Breakfast placed at bedside. 

## 2021-11-20 NOTE — ED Notes (Signed)
Declines to take his medications at this time. Pt clean and dry. ?

## 2021-11-20 NOTE — ED Notes (Signed)
Report received from Olivia, RN including SBAR. Patient alert and oriented, warm and dry, and in no acute distress. Patient denies SI, HI, AVH and pain. Patient made aware of Q15 minute rounds and Rover and Officer presence for their safety. Patient instructed to come to this nurse with needs or concerns.  °

## 2021-11-20 NOTE — ED Notes (Signed)
Pt was made aware of need for urine sample during assessment. Pt has ambulated to toilet for restroom use during day shift. Pt expresses understanding ?

## 2021-11-20 NOTE — ED Notes (Addendum)
Pt changed into clean bed, brief, and gown. Pt moved to recliner for comfort.  Pt increasingly weak, requiring 2 standby assist transfer to recliner. EDP Malinda notified. Malinda at bedside. ?

## 2021-11-20 NOTE — ED Notes (Signed)
Pt ambulates to door, needs to use restroom. Restroom is occupied,  pt provided with urinal in room to urinate, sample will be collected. ?

## 2021-11-20 NOTE — Consult Note (Signed)
[facility] Psych ED Progress Note  11/20/2021 8:27 AM Brandon [name]  MRN:  [id number]   Method of visit?: Face to Face   Subjective:Patient appears more at baseline today. Patient is lying in bed, non-sensical statements that are usual for him. He is alert not as somnolent as yesterday.  Continue with course. Needs some encouragement to take medications at times. He has urinated on himself at times. Medical reasons for this have been explored; likely behavioral.  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[place])  Total Time spent with patient: 20 minutes  Past Psychiatric History: see prior  Past Medical History:  Past Medical History:  Diagnosis Date   ADHD (attention deficit hyperactivity disorder)    Bipolar 1 disorder ([place])    Chronic back pain    Chronic constipation    Chronic neck pain    Hypertension    Multiple sclerosis ([place]) 05/20/2013   left sided weakness, dysarthria   Non-compliance    Obesity    Pulmonary embolism ([place])    Schizophrenia ([place])    Stroke ([place])    left sided deficits - pt's mother denies this    Past Surgical History:  Procedure Laterality Date   NO PAST SURGERIES     None     RADIOLOGY WITH ANESTHESIA N/A 01/16/2021   Procedure: MRI WITH ANESTHESIA CERVICAL AND THORASIC SPINE WITH AND WITHOUT CONTRAST;  Surgeon: Radiologist, Medication, MD;  Location: [place];  Service: Radiology;  Laterality: N/A;   TOOTH EXTRACTION N/A 06/24/2019   Procedure: DENTAL RESTORATION/EXTRACTION OF TEETH NUMBER ONE, SIXTEEN, SEVENTEEN, NINETEEN, THIRTY-TWO;  Surgeon: Diona Browner, DDS;  Location: [place];  Service: Oral Surgery;  Laterality: N/A;   Family History:  Family History  Problem Relation Age of Onset   Diabetes Mother    ADD / ADHD Brother    Family Psychiatric  History:  Social History:  Social History    Substance and Sexual Activity  Alcohol Use Not Currently   Alcohol/week: 0.0 standard drinks   Comment: "A little bit"      Social History   Substance and Sexual Activity  Drug Use Not Currently   Types: Marijuana   Comment: Last used: unknown     Social History   Socioeconomic History   Marital status: Single    Spouse name: Not on file   Number of children: 0   Years of education: 11th   Highest education level: Not on file  Occupational History   Occupation: unemployed    Employer: [employer]    Comment: Disbaled  Tobacco Use   Smoking status: Every Day    Packs/day: 0.25    Types: Cigarettes   Smokeless tobacco: Never   Tobacco comments:    2 cigarettes a day  Vaping Use   Vaping Use: Never used  Substance and Sexual Activity   Alcohol use: Not Currently    Alcohol/week: 0.0 standard drinks    Comment: "A little bit"    Drug use: Not Currently    Types: Marijuana    Comment: Last used: unknown    Sexual activity: Not on file  Other Topics Concern   Not on file  Social History Narrative   Patient lives at home with his mother.   Disabled.   Education 11 th grade .   Right handed.   Drinks caffeine occassionally  Social Determinants of Health   Financial Resource Strain: Not on file  Food Insecurity: Not on file  Transportation Needs: Not on file  Physical Activity: Not on file  Stress: Not on file  Social Connections: Not on file    Sleep: Good  Appetite:  Good  Current Medications: Current Facility-Administered Medications  Medication Dose Route Frequency Provider Last Rate Last Admin   amLODipine (NORVASC) tablet 10 mg  10 mg Oral Daily Lucrezia Starch, MD   10 mg at 11/19/21 1056   apixaban (ELIQUIS) tablet 5 mg  5 mg Oral BID Lucrezia Starch, MD   5 mg at 11/19/21 2151   divalproex (DEPAKOTE) DR tablet 500 mg  500 mg Oral Q12H Lucrezia Starch, MD   500 mg at 11/19/21 2151   docusate sodium (COLACE) capsule 100 mg  100 mg Oral  BID Lucrezia Starch, MD   100 mg at 11/19/21 2151   haloperidol (HALDOL) tablet 10 mg  10 mg Oral BID Lucrezia Starch, MD   10 mg at 11/19/21 2151   latanoprost (XALATAN) 0.005 % ophthalmic solution 1 drop  1 drop Both Eyes QHS Lucrezia Starch, MD   1 drop at 11/19/21 2150   loratadine (CLARITIN) tablet 10 mg  10 mg Oral Daily PRN Lucrezia Starch, MD       nicotine (NICODERM CQ - dosed in mg/24 hours) patch 14 mg  14 mg Transdermal Daily Lucrezia Starch, MD   14 mg at 11/19/21 1058   OLANZapine (ZYPREXA) tablet 15 mg  15 mg Oral QHS Lucrezia Starch, MD   15 mg at 11/19/21 2151   temazepam (RESTORIL) capsule 15 mg  15 mg Oral QHS Lucrezia Starch, MD   15 mg at 11/19/21 2151   ziprasidone (GEODON) injection 20 mg  20 mg Intramuscular Once Lucrezia Starch, MD       Current Outpatient Medications  Medication Sig Dispense Refill   amantadine (SYMMETREL) 100 MG capsule Take 100 mg by mouth 2 (two) times daily.     cephALEXin (KEFLEX) 500 MG capsule Take 1 capsule (500 mg total) by mouth 2 (two) times daily for 5 days. 10 capsule 0   metFORMIN (GLUCOPHAGE-XR) 500 MG 24 hr tablet Take 500 mg by mouth 2 (two) times daily as needed.     amLODipine (NORVASC) 10 MG tablet Take 1 tablet (10 mg total) by mouth daily. 30 tablet 0   apixaban (ELIQUIS) 5 MG TABS tablet Take 1 tablet (5 mg total) by mouth 2 (two) times daily. 60 tablet 0   benztropine (COGENTIN) 1 MG tablet Take 1 tablet (1 mg total) by mouth 2 (two) times daily. 60 tablet 0   divalproex (DEPAKOTE) 500 MG DR tablet Take 1 tablet (500 mg total) by mouth every 12 (twelve) hours. 60 tablet 0   docusate sodium (COLACE) 100 MG capsule Take 1 capsule (100 mg total) by mouth 2 (two) times daily. 60 capsule 0   haloperidol (HALDOL) 10 MG tablet Take 1 tablet (10 mg total) by mouth 2 (two) times daily. 60 tablet 0   loratadine (CLARITIN) 10 MG tablet Take 1 tablet (10 mg total) by mouth daily as needed for allergies. 30 tablet 0   losartan  (COZAAR) 50 MG tablet Take 1 tablet (50 mg total) by mouth 2 (two) times daily. 60 tablet 0   LUMIGAN 0.01 % SOLN Place 1 drop into both eyes at bedtime. 2.5 mL 0   nicotine (  NICODERM CQ - DOSED IN MG/24 HOURS) 14 mg/24hr patch Place 1 patch (14 mg total) onto the skin daily. 28 patch 0   OLANZapine (ZYPREXA) 15 MG tablet Take 1 tablet (15 mg total) by mouth at bedtime. 30 tablet 0   temazepam (RESTORIL) 15 MG capsule Take 1 capsule (15 mg total) by mouth at bedtime. 30 capsule 0   topiramate (TOPAMAX) 25 MG tablet Take 1 tablet (25 mg total) by mouth 2 (two) times daily. 60 tablet 0    Lab Results:  Results for orders placed or performed during the hospital encounter of 11/16/21 (from the past 48 hour(s))  Valproic acid level     Status: Abnormal   Collection Time: 11/19/21 11:43 AM  Result Value Ref Range   Valproic Acid Lvl 46 (L) 50.0 - 100.0 ug/mL    Comment: Performed at [facility], [address] [place], [place] [address]  Comprehensive metabolic panel     Status: Abnormal   Collection Time: 11/19/21 11:43 AM  Result Value Ref Range   Sodium 141 135 - 145 mmol/L   Potassium 4.1 3.5 - 5.1 mmol/L   Chloride 105 98 - 111 mmol/L   CO2 29 22 - 32 mmol/L   Glucose, Bld 111 (H) 70 - 99 mg/dL    Comment: Glucose reference range applies only to samples taken after fasting for at least 8 hours.   BUN 14 6 - 20 mg/dL   Creatinine, Ser 1.19 0.61 - 1.24 mg/dL   Calcium 9.1 8.9 - 14.7 mg/dL   Total Protein 6.3 (L) 6.5 - 8.1 g/dL   Albumin 3.5 3.5 - 5.0 g/dL   AST 16 15 - 41 U/L   ALT 29 0 - 44 U/L   Alkaline Phosphatase 75 38 - 126 U/L   Total Bilirubin 0.6 0.3 - 1.2 mg/dL   GFR, Estimated >82 >95 mL/min    Comment: (NOTE) Calculated using the CKD-EPI Creatinine Equation (2021)    Anion gap 7 5 - 15    Comment: Performed at [facility], [address] [place], [place] [address]  CBC with Differential     Status: Abnormal   Collection Time: 11/19/21 11:43 AM   Result Value Ref Range   WBC 6.2 4.0 - 10.5 K/uL   RBC 5.87 (H) 4.22 - 5.81 MIL/uL   Hemoglobin 14.6 13.0 - 17.0 g/dL   HCT 86.5 78.4 - 69.6 %   MCV 83.1 80.0 - 100.0 fL   MCH 24.9 (L) 26.0 - 34.0 pg   MCHC 29.9 (L) 30.0 - 36.0 g/dL   RDW 29.5 28.4 - 13.2 %   Platelets 175 150 - 400 K/uL   nRBC 0.0 0.0 - 0.2 %   Neutrophils Relative % 63 %   Neutro Abs 3.9 1.7 - 7.7 K/uL   Lymphocytes Relative 26 %   Lymphs Abs 1.6 0.7 - 4.0 K/uL   Monocytes Relative 6 %   Monocytes Absolute 0.4 0.1 - 1.0 K/uL   Eosinophils Relative 3 %   Eosinophils Absolute 0.2 0.0 - 0.5 K/uL   Basophils Relative 1 %   Basophils Absolute 0.0 0.0 - 0.1 K/uL   Immature Granulocytes 1 %   Abs Immature Granulocytes 0.04 0.00 - 0.07 K/uL    Comment: Performed at [facility], [address] [place], [place] [address]  Troponin I (High Sensitivity)     Status: None   Collection Time: 11/19/21 11:43 AM  Result Value Ref Range  Troponin I (High Sensitivity) 4 <18 ng/L    Comment: (NOTE) Elevated high sensitivity troponin I (hsTnI) values and significant  changes across serial measurements may suggest ACS but many other  chronic and acute conditions are known to elevate hsTnI results.  Refer to the "Links" section for chest pain algorithms and additional  guidance. Performed at [facility], [address] [place], [place] [address]     Blood Alcohol level:  Lab Results  Component Value Date   [facility] <10 11/16/2021   ETH <10 10/21/2021    Physical Findings: AIMS:  , ,  ,  ,    CIWA:    COWS:     Musculoskeletal: Strength & Muscle Tone: within normal limits Gait & Station: normal Patient leans: N/A  Psychiatric Specialty Exam:  Presentation  General Appearance: Appropriate for Environment; Casual  Eye Contact:Fair  Speech:Clear and Coherent; Normal Rate  Speech Volume:Normal  Handedness:Right   Mood and Affect  Mood:Euthymic  Affect:Congruent   Thought Process   Thought Processes:Linear; Irrevelant  Descriptions of Associations:Intact  Orientation:Partial  Thought Content:Logical  History of Schizophrenia/Schizoaffective disorder:Yes  Duration of Psychotic Symptoms:Greater than six months  Hallucinations:No data recorded Ideas of Reference:None  Suicidal Thoughts:No data recorded Homicidal Thoughts:No data recorded  Sensorium  Memory:Immediate Poor; Recent Poor; Remote Poor  Judgment:Impaired  Insight:Lacking   Executive Functions  Concentration:Poor  Attention Span:Poor  Recall:Poor  Fund of Knowledge:Poor  Language:Poor   Psychomotor Activity  Psychomotor Activity:No data recorded  Assets  Assets:Resilience; Physical Health; Social Support   Sleep  Sleep:No data recorded   Physical Exam: Physical Exam ROS Blood pressure 118/76, pulse 79, temperature 98 F (36.7 C), temperature source Oral, resp. rate 17, height 6\' 2"  (1.88 m), weight 131 kg, SpO2 99 %. Body mass index is 37.08 kg/m.  Treatment Plan Summary: Daily contact with patient to assess and evaluate symptoms and progress in treatment and Medication management. Is supposed to return to group home 5/18.  Sherlon Handing, NP 11/20/2021, 8:27 AM

## 2021-11-20 NOTE — ED Provider Notes (Signed)
Nurse reports patient seems weak.  He has had 2 people help him get in and out of the recliner.  He urinated on himself.  When I examined him he seems very slow and distant.  He follows commands.  He does not have any ataxia on heel-to-shin testing or finger-nose testing.  His motor strength in his legs appears normal I cannot overcome his knee flexors or hip flexors.  He is denying any numbness in his legs or perineum.  I am wondering if this is more psychological.  I will consult psych again.  See what their opinion is.  They think it is medical monitoring and MRI. ?  ?Arnaldo Natal, MD ?11/20/21 1735 ? ?

## 2021-11-21 DIAGNOSIS — S61216A Laceration without foreign body of right little finger without damage to nail, initial encounter: Secondary | ICD-10-CM | POA: Diagnosis not present

## 2021-11-21 NOTE — ED Notes (Signed)
Pt ambulatory to the restroom; gait slightly unsteady. Bed linens changed and bed and room cleaned. Assisted pt with changing his gown as well. Tolerated well.

## 2021-11-21 NOTE — ED Notes (Signed)
Pt given dinner tray and beverage  

## 2021-11-21 NOTE — ED Notes (Signed)
Pt resting on stretcher with eyes closed. Breakfast tray and beverage provided. Pt encouraged to sit up and eat. Verbalized understanding.

## 2021-11-21 NOTE — ED Notes (Signed)
VS not obtained at this time due to Pt sleeping. Equal chest rise and fall noted, will obtain VS when Pt wakes.

## 2021-11-21 NOTE — ED Notes (Signed)
VOL pending d/c to group home

## 2021-11-21 NOTE — TOC Progression Note (Signed)
Transition of Care [facility]) - Progression Note    Patient Details  Name: Brandon [name] MRN: [id number] Date of Birth: 08-26-1990  [facility]) CM/SW Contact   Cellar, RN Phone Number: 11/21/2021, 9:51 AM  Clinical Narrative:    LVMM for [facility] Director and Owner [phone] requesting confirmation of pick up today.         Expected Discharge Plan and Services                                                 Social Determinants of Health (SDOH) Interventions    Readmission Risk Interventions     View : No data to display.

## 2021-11-21 NOTE — ED Notes (Signed)
Lunch tray given. 

## 2021-11-21 NOTE — ED Notes (Signed)
Pt given nighttime snack. 

## 2021-11-21 NOTE — ED Provider Notes (Signed)
Emergency Medicine Observation Re-evaluation Note  Brandon [name] is a 31 y.o. male, seen on rounds today.  Pt initially presented to the ED for complaints of Psychiatric Evaluation Currently, the patient is resting, voices no medical complaints.  Physical Exam  BP 115/67 (BP Location: Right Arm)   Pulse 77   Temp 98 F (36.7 C) (Oral)   Resp 18   Ht 6\' 2"  (1.88 m)   Wt 131 kg   SpO2 99%   BMI 37.08 kg/m  Physical Exam General: Resting in no acute distress Cardiac: No cyanosis Lungs: Equal rise and fall Psych: Not agitated  ED Course / MDM  EKG:EKG Interpretation  Date/Time:  Tuesday Nov 19 2021 12:09:41 EDT Ventricular Rate:  78 PR Interval:  124 QRS Duration: 86 QT Interval:  328 QTC Calculation: 373 R Axis:   4 Text Interpretation: Normal sinus rhythm Cannot rule out Anterior infarct , age undetermined Abnormal ECG When compared with ECG of 26-Aug-2021 11:41, PREVIOUS ECG IS PRESENT Confirmed by UNCONFIRMED, DOCTOR (28-Aug-2021), editor [id number]) on 11/20/2021 1:31:38 PM  I have reviewed the labs performed to date as well as medications administered while in observation.  Recent changes in the last 24 hours include no events overnight.  Plan  Current plan is for psychiatric disposition.  Brandon [name] is not under involuntary commitment.     [name], MD 11/21/21 [phone]

## 2021-11-22 DIAGNOSIS — S61216A Laceration without foreign body of right little finger without damage to nail, initial encounter: Secondary | ICD-10-CM | POA: Diagnosis not present

## 2021-11-22 NOTE — ED Notes (Signed)
Patient given ice cream for snack as requested.

## 2021-11-22 NOTE — ED Notes (Signed)
RN called Timmy, owner of the group home.  Timmy states they are still waiting for the repair guy to fix the broken windows before the pt can return.   Per Timmy, the repair man is coming today.

## 2021-11-22 NOTE — ED Notes (Signed)
Pt given clean gown and underwear and directed to clean himself with wipes by Selena Batten EDT.  Bed linen change and bed wiped clean.

## 2021-11-22 NOTE — TOC Progression Note (Signed)
Transition of Care [facility]) - Progression Note    Patient Details  Name: Brandon [name] MRN: [id number] Date of Birth: Sep 06, 1990  Transition of [facility]) CM/SW Contact  Birchwood Cellar, RN Phone Number: 11/22/2021, 8:37 AM  Clinical Narrative:    Call from [facility] reports they are working to get window repaired today and will plan for patient to return today once repairs complete.         Expected Discharge Plan and Services                                                 Social Determinants of Health (SDOH) Interventions    Readmission Risk Interventions     View : No data to display.

## 2021-11-22 NOTE — ED Notes (Signed)
Breakfast tray placed in room

## 2021-11-22 NOTE — ED Notes (Signed)
Lunch tray given. 

## 2021-11-22 NOTE — ED Notes (Signed)
Pt was given dinner tray and beverage 

## 2021-11-22 NOTE — ED Notes (Signed)
Pt given crackers.  

## 2021-11-22 NOTE — ED Notes (Signed)
VOL/pending discharge back to group home

## 2021-11-23 DIAGNOSIS — S61216A Laceration without foreign body of right little finger without damage to nail, initial encounter: Secondary | ICD-10-CM | POA: Diagnosis not present

## 2021-11-23 NOTE — ED Notes (Signed)
Breakfast tray given at this time.  

## 2021-11-23 NOTE — ED Provider Notes (Signed)
Emergency Medicine Observation Re-evaluation Note  Brandon [name] is a 31 y.o. male, seen on rounds today.  Pt initially presented to the ED for complaints of Psychiatric Evaluation No acute events overnight or during the day today.    Physical Exam  BP 120/79   Pulse 77   Temp 99 F (37.2 C) (Oral)   Resp 16   Ht 6\' 2"  (1.88 m)   Wt 131 kg   SpO2 100%   BMI 37.08 kg/m    ED Course / MDM   I reviewed the patient's recent lab work overall reassuring.  Normal urinalysis, negative COVID/flu.  Normal CBC and CMP.  Troponin negative.  Plan  Current plan is for placement to an appropriate living facility once available. is not under involuntary commitment.     [name], MD 11/23/21 [phone]

## 2021-11-23 NOTE — ED Provider Notes (Signed)
Group home has evidently fixed the window.  They have sent a taxi for patient.  Patient ready for discharge, returning to group home.     Sharyn Creamer, MD 11/23/21 [phone]

## 2021-11-23 NOTE — ED Notes (Signed)
Report received from Dawn T , RN including SBAR. On initial round after report Pt is warm/dry, resting quietly in room without any s/s of distress.  Will continue to monitor throughout shift as ordered for any changes in behaviors and for continued safety.   

## 2021-11-23 NOTE — ED Notes (Signed)
Pt was A/Ox3, guardian/mother notified and agreeable for discharge.  group home owner Timmy Stann Mainland provided [employer] cab for transport.  All belongings returned and accounted for by pt.

## 2021-12-03 ENCOUNTER — Other Ambulatory Visit (HOSPITAL_COMMUNITY): Payer: Self-pay

## 2021-12-03 ENCOUNTER — Other Ambulatory Visit: Payer: Self-pay

## 2021-12-03 ENCOUNTER — Other Ambulatory Visit: Payer: Self-pay | Admitting: Neurology

## 2021-12-03 DIAGNOSIS — G35 Multiple sclerosis: Secondary | ICD-10-CM

## 2021-12-04 ENCOUNTER — Other Ambulatory Visit (HOSPITAL_COMMUNITY): Payer: Self-pay

## 2021-12-04 MED ORDER — OCREVUS 300 MG/10ML IV SOLN
INTRAVENOUS | 0 refills | Status: DC
  Filled 2021-12-04: qty 20, 180d supply, fill #0

## 2021-12-05 ENCOUNTER — Other Ambulatory Visit (HOSPITAL_COMMUNITY): Payer: Self-pay

## 2021-12-06 ENCOUNTER — Non-Acute Institutional Stay (HOSPITAL_COMMUNITY)
Admission: RE | Admit: 2021-12-06 | Discharge: 2021-12-06 | Disposition: A | Discharge status: Home / Self Care | Payer: Medicare Other | Source: Ambulatory Visit | Attending: Internal Medicine | Admitting: Internal Medicine

## 2021-12-06 ENCOUNTER — Other Ambulatory Visit (HOSPITAL_COMMUNITY): Payer: Self-pay

## 2021-12-06 DIAGNOSIS — G35 Multiple sclerosis: Secondary | ICD-10-CM | POA: Diagnosis not present

## 2021-12-06 MED ORDER — METHYLPREDNISOLONE SODIUM SUCC 125 MG IJ SOLR
125.0000 mg | Freq: Once | INTRAMUSCULAR | Status: AC
  Administered 2021-12-06: 125 mg via INTRAVENOUS

## 2021-12-06 MED ORDER — ACETAMINOPHEN 325 MG PO TABS
650.0000 mg | ORAL_TABLET | Freq: Once | ORAL | Status: AC
  Administered 2021-12-06: 650 mg via ORAL

## 2021-12-06 MED ORDER — FAMOTIDINE IN NACL 20-0.9 MG/50ML-% IV SOLN
20.0000 mg | Freq: Once | INTRAVENOUS | Status: AC
  Administered 2021-12-06: 20 mg via INTRAVENOUS

## 2021-12-06 MED ORDER — SODIUM CHLORIDE 0.9 % IV SOLN: INTRAVENOUS | Status: DC | PRN

## 2021-12-06 MED ORDER — DIPHENHYDRAMINE HCL 50 MG/ML IJ SOLN
50.0000 mg | Freq: Once | INTRAMUSCULAR | Status: AC
  Administered 2021-12-06: 50 mg via INTRAVENOUS

## 2021-12-06 MED ORDER — SODIUM CHLORIDE 0.9 % IV SOLN
300.0000 mg | Freq: Once | INTRAVENOUS | Status: AC
  Administered 2021-12-06: 300 mg via INTRAVENOUS
  Filled 2021-12-06: qty 10

## 2021-12-06 NOTE — Progress Notes (Signed)
PATIENT CARE CENTER NOTE     Diagnosis:  Multiple sclerosis (HCC) (G35)     Provider: Butch Penny, NP     Procedure: Ocrevus 300 mg IV      Note: Patient received Ocrevus 300 mg infusion via PIV. Dose verified with Baxter Hire, RN at [facility] via secure chat. Pre-medications given per order and infusion titrated per protocol. Patient tolerated infusion well.  Mother at bedside throughout infusion. Vital signs stable. Discharge instructions given. Patient alert, oriented and ambulatory at discharge. Discharged home with mother.

## 2021-12-09 ENCOUNTER — Other Ambulatory Visit (HOSPITAL_COMMUNITY): Payer: Self-pay

## 2021-12-18 ENCOUNTER — Other Ambulatory Visit (HOSPITAL_COMMUNITY): Payer: Self-pay

## 2022-01-02 ENCOUNTER — Telehealth: Payer: Self-pay | Admitting: Adult Health

## 2022-01-02 NOTE — Telephone Encounter (Signed)
Pt mothers need Notes from the last MRI stating he is mentally declining, she is try to get nursing home placement for the Pt. She would like for a nurse to give her a call

## 2022-01-02 NOTE — Telephone Encounter (Addendum)
I called mother of pt (I believe is his legal guardian). She is asking about his last visit with Dr. Anne Hahn 10-2020, he was having increasing hallucinations and physical decline. She is asking for this OV.  I told her would send to [facility].  May require signed release.  (She has had back surgery so if can get over phone would be better).  She would like emailed to her if possible [email or web address].  She is trying to get funding to get pt placed in nursing facility.  Also needs appt with Dr. Epimenio Foot.  (He is on Ocrevus last infusion 12-06-2021 at [facility]).

## 2022-01-02 NOTE — Telephone Encounter (Signed)
Called mother back. Scheduled appt with Dr. Epimenio Foot on 01/09/22 at 9am. She asked about record request. Aware that request sent to [facility] and she will f/u with her on this. Call dropped. I tried calling back but went to VM. Confirmed I have appt scheduled and we will f/u about record request. Asked her to call back if she has any further questions.

## 2022-01-09 ENCOUNTER — Ambulatory Visit: Payer: Medicare Other | Admitting: Neurology

## 2022-01-16 ENCOUNTER — Other Ambulatory Visit: Payer: Self-pay | Admitting: Neurology

## 2022-04-02 ENCOUNTER — Emergency Department (HOSPITAL_COMMUNITY): Payer: Medicare Other

## 2022-04-02 ENCOUNTER — Other Ambulatory Visit: Payer: Self-pay

## 2022-04-02 ENCOUNTER — Emergency Department (HOSPITAL_COMMUNITY)
Admission: EM | Admit: 2022-04-02 | Discharge: 2022-04-03 | Disposition: A | Discharge status: Home / Self Care | Payer: Medicare Other | Attending: Emergency Medicine | Admitting: Emergency Medicine

## 2022-04-02 DIAGNOSIS — F201 Disorganized schizophrenia: Secondary | ICD-10-CM | POA: Diagnosis not present

## 2022-04-02 DIAGNOSIS — R456 Violent behavior: Secondary | ICD-10-CM | POA: Diagnosis present

## 2022-04-02 DIAGNOSIS — Z7901 Long term (current) use of anticoagulants: Secondary | ICD-10-CM | POA: Insufficient documentation

## 2022-04-02 DIAGNOSIS — Z79899 Other long term (current) drug therapy: Secondary | ICD-10-CM | POA: Diagnosis not present

## 2022-04-02 DIAGNOSIS — R4689 Other symptoms and signs involving appearance and behavior: Secondary | ICD-10-CM

## 2022-04-02 DIAGNOSIS — Z20822 Contact with and (suspected) exposure to covid-19: Secondary | ICD-10-CM | POA: Insufficient documentation

## 2022-04-02 DIAGNOSIS — F209 Schizophrenia, unspecified: Secondary | ICD-10-CM | POA: Insufficient documentation

## 2022-04-02 DIAGNOSIS — G35 Multiple sclerosis: Secondary | ICD-10-CM | POA: Insufficient documentation

## 2022-04-02 DIAGNOSIS — Z046 Encounter for general psychiatric examination, requested by authority: Secondary | ICD-10-CM

## 2022-04-02 LAB — CBC WITH DIFFERENTIAL/PLATELET
Abs Immature Granulocytes: 0.03 10*3/uL (ref 0.00–0.07)
Basophils Absolute: 0.1 10*3/uL (ref 0.0–0.1)
Basophils Relative: 1 %
Eosinophils Absolute: 0.1 10*3/uL (ref 0.0–0.5)
Eosinophils Relative: 1 %
HCT: 41.8 % (ref 39.0–52.0)
Hemoglobin: 13.4 g/dL (ref 13.0–17.0)
Immature Granulocytes: 0 %
Lymphocytes Relative: 32 %
Lymphs Abs: 2.5 10*3/uL (ref 0.7–4.0)
MCH: 26.1 pg (ref 26.0–34.0)
MCHC: 32.1 g/dL (ref 30.0–36.0)
MCV: 81.5 fL (ref 80.0–100.0)
Monocytes Absolute: 0.8 10*3/uL (ref 0.1–1.0)
Monocytes Relative: 10 %
Neutro Abs: 4.4 10*3/uL (ref 1.7–7.7)
Neutrophils Relative %: 56 %
Platelets: 181 10*3/uL (ref 150–400)
RBC: 5.13 MIL/uL (ref 4.22–5.81)
RDW: 13.3 % (ref 11.5–15.5)
WBC: 7.8 10*3/uL (ref 4.0–10.5)
nRBC: 0 % (ref 0.0–0.2)

## 2022-04-02 LAB — RESP PANEL BY RT-PCR (FLU A&B, COVID) ARPGX2
Influenza A by PCR: NEGATIVE
Influenza B by PCR: NEGATIVE
SARS Coronavirus 2 by RT PCR: NEGATIVE

## 2022-04-02 LAB — COMPREHENSIVE METABOLIC PANEL
ALT: 44 U/L (ref 0–44)
AST: 23 U/L (ref 15–41)
Albumin: 4.6 g/dL (ref 3.5–5.0)
Alkaline Phosphatase: 80 U/L (ref 38–126)
Anion gap: 10 (ref 5–15)
BUN: 12 mg/dL (ref 6–20)
CO2: 22 mmol/L (ref 22–32)
Calcium: 10 mg/dL (ref 8.9–10.3)
Chloride: 108 mmol/L (ref 98–111)
Creatinine, Ser: 0.88 mg/dL (ref 0.61–1.24)
GFR, Estimated: >60 mL/min (ref >60)
Glucose, Bld: 91 mg/dL (ref 70–99)
Potassium: 3.8 mmol/L (ref 3.5–5.1)
Sodium: 140 mmol/L (ref 135–145)
Total Bilirubin: 0.7 mg/dL (ref 0.3–1.2)
Total Protein: 6.9 g/dL (ref 6.5–8.1)

## 2022-04-02 LAB — SALICYLATE LEVEL: Salicylate Lvl: <7 mg/dL — ABNORMAL LOW (ref 7.0–30.0)

## 2022-04-02 LAB — ACETAMINOPHEN LEVEL: Acetaminophen (Tylenol), Serum: <10 ug/mL — ABNORMAL LOW (ref 10–30)

## 2022-04-02 LAB — ETHANOL: Alcohol, Ethyl (B): <10 mg/dL (ref <10)

## 2022-04-02 MED ORDER — LORATADINE 10 MG PO TABS: 10.0000 mg | ORAL_TABLET | Freq: Every day | ORAL | Status: DC | PRN

## 2022-04-02 MED ORDER — HALOPERIDOL 5 MG PO TABS
10.0000 mg | ORAL_TABLET | Freq: Two times a day (BID) | ORAL | Status: DC
  Administered 2022-04-02 – 2022-04-03 (×2): 10 mg via ORAL
  Filled 2022-04-02 (×2): qty 2

## 2022-04-02 MED ORDER — METFORMIN HCL ER 500 MG PO TB24
500.0000 mg | ORAL_TABLET | Freq: Every day | ORAL | Status: DC
Start: 2022-04-03 — End: 2022-04-03
  Administered 2022-04-03: 500 mg via ORAL
  Filled 2022-04-02 (×2): qty 1

## 2022-04-02 MED ORDER — TEMAZEPAM 15 MG PO CAPS: 15.0000 mg | ORAL_CAPSULE | Freq: Every day | ORAL | Status: DC

## 2022-04-02 MED ORDER — DOCUSATE SODIUM 100 MG PO CAPS
100.0000 mg | ORAL_CAPSULE | Freq: Two times a day (BID) | ORAL | Status: DC
  Administered 2022-04-02 – 2022-04-03 (×2): 100 mg via ORAL
  Filled 2022-04-02 (×2): qty 1

## 2022-04-02 MED ORDER — LOSARTAN POTASSIUM 50 MG PO TABS
50.0000 mg | ORAL_TABLET | Freq: Two times a day (BID) | ORAL | Status: DC
  Administered 2022-04-02 – 2022-04-03 (×2): 50 mg via ORAL
  Filled 2022-04-02 (×2): qty 1

## 2022-04-02 MED ORDER — BENZTROPINE MESYLATE 1 MG PO TABS
1.0000 mg | ORAL_TABLET | Freq: Two times a day (BID) | ORAL | Status: DC
  Administered 2022-04-02 – 2022-04-03 (×2): 1 mg via ORAL
  Filled 2022-04-02 (×3): qty 1

## 2022-04-02 MED ORDER — LORAZEPAM 2 MG/ML IJ SOLN
2.0000 mg | Freq: Once | INTRAMUSCULAR | Status: AC
  Administered 2022-04-02: 2 mg via INTRAMUSCULAR
  Filled 2022-04-02: qty 1

## 2022-04-02 MED ORDER — AMLODIPINE BESYLATE 5 MG PO TABS
10.0000 mg | ORAL_TABLET | Freq: Every day | ORAL | Status: DC
  Administered 2022-04-02 – 2022-04-03 (×2): 10 mg via ORAL
  Filled 2022-04-02 (×2): qty 2

## 2022-04-02 MED ORDER — APIXABAN 5 MG PO TABS
5.0000 mg | ORAL_TABLET | Freq: Two times a day (BID) | ORAL | Status: DC
  Administered 2022-04-02 – 2022-04-03 (×2): 5 mg via ORAL
  Filled 2022-04-02 (×2): qty 1

## 2022-04-02 MED ORDER — DIVALPROEX SODIUM 500 MG PO DR TAB
500.0000 mg | DELAYED_RELEASE_TABLET | Freq: Two times a day (BID) | ORAL | Status: DC
  Administered 2022-04-02 – 2022-04-03 (×2): 500 mg via ORAL
  Filled 2022-04-02: qty 1
  Filled 2022-04-02: qty 2

## 2022-04-02 MED ORDER — OLANZAPINE 5 MG PO TABS
15.0000 mg | ORAL_TABLET | Freq: Every day | ORAL | Status: DC
  Administered 2022-04-02: 15 mg via ORAL
  Filled 2022-04-02: qty 1

## 2022-04-02 MED ORDER — ZIPRASIDONE MESYLATE 20 MG IM SOLR
20.0000 mg | Freq: Once | INTRAMUSCULAR | Status: AC
  Administered 2022-04-02: 20 mg via INTRAMUSCULAR
  Filled 2022-04-02: qty 20

## 2022-04-02 MED ORDER — TOPIRAMATE 25 MG PO TABS
25.0000 mg | ORAL_TABLET | Freq: Two times a day (BID) | ORAL | Status: DC
  Administered 2022-04-02 – 2022-04-03 (×2): 25 mg via ORAL
  Filled 2022-04-02 (×2): qty 1

## 2022-04-02 MED ORDER — AMANTADINE HCL 100 MG PO CAPS
100.0000 mg | ORAL_CAPSULE | Freq: Two times a day (BID) | ORAL | Status: DC
Start: 2022-04-02 — End: 2022-04-03
  Administered 2022-04-02 – 2022-04-03 (×2): 100 mg via ORAL
  Filled 2022-04-02 (×3): qty 1

## 2022-04-02 NOTE — ED Notes (Signed)
Pt walking in halls and yelling, staff attempt to redirect pt back to his room; continues to wander out into hallway yelling

## 2022-04-02 NOTE — ED Provider Notes (Signed)
[facility] EMERGENCY DEPARTMENT Provider Note   CSN: [id number] Arrival date & time: 04/02/22  1817     History Schizophrenia, developmental delay Chief Complaint  Patient presents with   Psychiatric Evaluation    Brandon [name] is a 31 y.o. male.  31 year old male with a past medical history of mental illness such as schizophrenia, developmental delay,ADHD presents to the ED via EMS for abnormal behavior.  According to EMS they were called out by mother, patient mom by not have taken medications.  He is acting hyperactive, hitting himself with a close fist, hitting his head on the wall.  Has struck his head on the wall multiple times although there is no obvious injury.  Patient is anticoagulated on Eliquis due to a history of recurrent pulmonary embolisms. He denies any complaint although is holding his chest with voicing pain.   The history is provided by the patient.       Home Medications Prior to Admission medications   Medication Sig Start Date End Date Taking? Authorizing Provider  amantadine (SYMMETREL) 100 MG capsule Take 100 mg by mouth 2 (two) times daily. 09/25/21   [provider]  amLODipine (NORVASC) 10 MG tablet Take 1 tablet (10 mg total) by mouth daily. 11/01/21   Clapacs, Madie Reno, MD  apixaban (ELIQUIS) 5 MG TABS tablet Take 1 tablet (5 mg total) by mouth 2 (two) times daily. 11/01/21   Clapacs, Madie Reno, MD  benztropine (COGENTIN) 1 MG tablet Take 1 tablet (1 mg total) by mouth 2 (two) times daily. 11/01/21   Clapacs, Madie Reno, MD  divalproex (DEPAKOTE) 500 MG DR tablet Take 1 tablet (500 mg total) by mouth every 12 (twelve) hours. 11/01/21   Clapacs, Madie Reno, MD  docusate sodium (COLACE) 100 MG capsule Take 1 capsule (100 mg total) by mouth 2 (two) times daily. 11/01/21   Clapacs, Madie Reno, MD  haloperidol (HALDOL) 10 MG tablet Take 1 tablet (10 mg total) by mouth 2 (two) times daily. 11/01/21   Clapacs, Madie Reno, MD  loratadine (CLARITIN) 10 MG  tablet Take 1 tablet (10 mg total) by mouth daily as needed for allergies. 11/01/21   Clapacs, Madie Reno, MD  losartan (COZAAR) 50 MG tablet Take 1 tablet (50 mg total) by mouth 2 (two) times daily. 11/01/21   Clapacs, Madie Reno, MD  LUMIGAN 0.01 % SOLN Place 1 drop into both eyes at bedtime. 11/01/21   Clapacs, Madie Reno, MD  metFORMIN (GLUCOPHAGE-XR) 500 MG 24 hr tablet Take 500 mg by mouth 2 (two) times daily as needed. 10/18/21   [provider]  nicotine (NICODERM CQ - DOSED IN MG/24 HOURS) 14 mg/24hr patch Place 1 patch (14 mg total) onto the skin daily. 11/01/21   Clapacs, Madie Reno, MD  ocrelizumab (OCREVUS) 300 MG/10ML injection Inject 2 vials ( 20 MLs) into the vein every 6 months 12/04/21   Ward Givens, NP  OLANZapine (ZYPREXA) 15 MG tablet Take 1 tablet (15 mg total) by mouth at bedtime. 11/01/21   Clapacs, Madie Reno, MD  temazepam (RESTORIL) 15 MG capsule Take 1 capsule (15 mg total) by mouth at bedtime. 11/01/21   Clapacs, Madie Reno, MD  topiramate (TOPAMAX) 25 MG tablet TAKE ONE TABLET BY MOUTH TWICE A DAY 01/23/22   Suzzanne Cloud, NP  gabapentin (NEURONTIN) 400 MG capsule Take 400 mg by mouth 3 (three) times daily. 12/26/20 01/14/21  [provider]  propranolol (INDERAL) 10 MG tablet Take 1 tablet (10  mg total) by mouth 2 (two) times daily. 05/03/15 01/03/20  Charm Rings, NP      Allergies    Patient has no known allergies.    Review of Systems   Review of Systems  Constitutional:  Negative for fever.  HENT:  Negative for sore throat.   Respiratory:  Negative for shortness of breath.   Cardiovascular:  Positive for chest pain.  Gastrointestinal:  Negative for abdominal pain, diarrhea and vomiting.  Genitourinary:  Negative for flank pain.  Musculoskeletal:  Negative for back pain.  Neurological:  Negative for light-headedness and headaches.  All other systems reviewed and are negative.   Physical Exam Updated Vital Signs BP (!) 168/113 (BP Location: Right Arm)   Pulse 82    Temp (!) 97.3 F (36.3 C) (Oral)   Resp 17   SpO2 100%  Physical Exam Vitals and nursing note reviewed.  Constitutional:      Appearance: Normal appearance.  HENT:     Head: Normocephalic.     Comments: No obvious deformity or signs of injury.  Cardiovascular:     Rate and Rhythm: Normal rate.  Pulmonary:     Effort: Pulmonary effort is normal.  Abdominal:     General: Abdomen is flat.  Musculoskeletal:     Cervical back: Normal range of motion and neck supple.  Skin:    General: Skin is warm and dry.  Neurological:     Mental Status: He is alert. He is disoriented.     ED Results / Procedures / Treatments   Labs (all labs ordered are listed, but only abnormal results are displayed) Labs Reviewed  SALICYLATE LEVEL - Abnormal; Notable for the following components:      Result Value   Salicylate Lvl <7.0 (*)    All other components within normal limits  ACETAMINOPHEN LEVEL - Abnormal; Notable for the following components:   Acetaminophen (Tylenol), Serum <10 (*)    All other components within normal limits  RESP PANEL BY RT-PCR (FLU A&B, COVID) ARPGX2  COMPREHENSIVE METABOLIC PANEL  ETHANOL  CBC WITH DIFFERENTIAL/PLATELET  RAPID URINE DRUG SCREEN, HOSP PERFORMED    EKG None  Radiology CT HEAD WO CONTRAST ( )  Result Date: 04/02/2022 CLINICAL DATA:  Psychiatric evaluation. History of mental illness and multiple sclerosis. Hyperactive all day today, wandering around and hitting his head on the wall. EXAM: CT HEAD WITHOUT CONTRAST CT CERVICAL SPINE WITHOUT CONTRAST TECHNIQUE: Multidetector CT imaging of the head and cervical spine was performed following the standard protocol without intravenous contrast. Multiplanar CT image reconstructions of the cervical spine were also generated. RADIATION DOSE REDUCTION: This exam was performed according to the departmental dose-optimization program which includes automated exposure control, adjustment of the mA and/or kV  according to patient size and/or use of iterative reconstruction technique. COMPARISON:  CT head dated Nov 19, 2021. MRI cervical spine dated January 16, 2021. CT cervical spine dated July 16, 2020. FINDINGS: CT HEAD FINDINGS Brain: No evidence of acute infarction, hemorrhage, hydrocephalus, extra-axial collection or mass lesion/mass effect. Unchanged patchy and confluent hypodensities throughout the cerebral hemispheric white matter consistent with chronic demyelinating disease. Vascular: No hyperdense vessel or unexpected calcification. Skull: Normal. Negative for fracture or focal lesion. Sinuses/Orbits: No acute finding. Other: None. CT CERVICAL SPINE FINDINGS Alignment: Straightening of the normal cervical lordosis. No traumatic malalignment. Skull base and vertebrae: No acute fracture. No primary bone lesion or focal pathologic process. Soft tissues and spinal canal: No prevertebral fluid or swelling. No visible  canal hematoma. Disc levels:  Normal. Upper chest: Negative. Other: None. IMPRESSION: 1. No acute intracranial abnormality. 2. Unchanged patchy and confluent hypodensities throughout the cerebral hemispheric white matter consistent with chronic demyelinating disease. 3. No acute cervical spine fracture or traumatic listhesis. Electronically Signed   By: Obie Dredge M.D.   On: 04/02/2022 19:26   CT Cervical Spine Wo Contrast  Result Date: 04/02/2022 CLINICAL DATA:  Psychiatric evaluation. History of mental illness and multiple sclerosis. Hyperactive all day today, wandering around and hitting his head on the wall. EXAM: CT HEAD WITHOUT CONTRAST CT CERVICAL SPINE WITHOUT CONTRAST TECHNIQUE: Multidetector CT imaging of the head and cervical spine was performed following the standard protocol without intravenous contrast. Multiplanar CT image reconstructions of the cervical spine were also generated. RADIATION DOSE REDUCTION: This exam was performed according to the departmental dose-optimization  program which includes automated exposure control, adjustment of the mA and/or kV according to patient size and/or use of iterative reconstruction technique. COMPARISON:  CT head dated Nov 19, 2021. MRI cervical spine dated January 16, 2021. CT cervical spine dated July 16, 2020. FINDINGS: CT HEAD FINDINGS Brain: No evidence of acute infarction, hemorrhage, hydrocephalus, extra-axial collection or mass lesion/mass effect. Unchanged patchy and confluent hypodensities throughout the cerebral hemispheric white matter consistent with chronic demyelinating disease. Vascular: No hyperdense vessel or unexpected calcification. Skull: Normal. Negative for fracture or focal lesion. Sinuses/Orbits: No acute finding. Other: None. CT CERVICAL SPINE FINDINGS Alignment: Straightening of the normal cervical lordosis. No traumatic malalignment. Skull base and vertebrae: No acute fracture. No primary bone lesion or focal pathologic process. Soft tissues and spinal canal: No prevertebral fluid or swelling. No visible canal hematoma. Disc levels:  Normal. Upper chest: Negative. Other: None. IMPRESSION: 1. No acute intracranial abnormality. 2. Unchanged patchy and confluent hypodensities throughout the cerebral hemispheric white matter consistent with chronic demyelinating disease. 3. No acute cervical spine fracture or traumatic listhesis. Electronically Signed   By: Obie Dredge M.D.   On: 04/02/2022 19:26    Procedures Procedures    Medications Ordered in ED Medications  amantadine (SYMMETREL) capsule 100 mg (has no administration in time range)  amLODipine (NORVASC) tablet 10 mg (has no administration in time range)  apixaban (ELIQUIS) tablet 5 mg (has no administration in time range)  benztropine (COGENTIN) tablet 1 mg (has no administration in time range)  divalproex (DEPAKOTE) DR tablet 500 mg (has no administration in time range)  docusate sodium (COLACE) capsule 100 mg (has no administration in time range)   haloperidol (HALDOL) tablet 10 mg (has no administration in time range)  loratadine (CLARITIN) tablet 10 mg (has no administration in time range)  losartan (COZAAR) tablet 50 mg (has no administration in time range)  metFORMIN (GLUCOPHAGE-XR) 24 hr tablet 500 mg (has no administration in time range)  OLANZapine (ZYPREXA) tablet 15 mg (has no administration in time range)  temazepam (RESTORIL) capsule 15 mg (has no administration in time range)  topiramate (TOPAMAX) tablet 25 mg (has no administration in time range)  ziprasidone (GEODON) injection 20 mg (20 mg Intramuscular Given 04/02/22 1921)  LORazepam (ATIVAN) injection 2 mg (2 mg Intramuscular Given 04/02/22 1921)    ED Course/ Medical Decision Making/ A&P                           Medical Decision Making Amount and/or Complexity of Data Reviewed Labs: ordered. Radiology: ordered.  Risk Prescription drug management.   This patient presents to  the ED for concern of aggressive behavior, this involves a number of treatment options, and is a complaint that carries with it a high risk of complications and morbidity.  Suicidal versus homicidal.    Co morbidities: Discussed in HPI   Brief History:  Patient here with aggressive behavior which EMS was called for, striking his head, striking his head against the wall and along with a closed fist.  He is on a blood thinner for history of pulmonary embolism.  EMR reviewed including pt PMHx, past surgical history and past visits to ER.   See HPI for more details   Lab Tests:  I ordered and independently interpreted labs.  The pertinent results include:    I personally reviewed all laboratory work and imaging. Metabolic panel without any acute abnormality specifically kidney function within normal limits and no significant electrolyte abnormalities. CBC without leukocytosis or significant anemia.   Imaging Studies:  NAD. I personally reviewed all imaging studies and no acute  abnormality found. I agree with radiology interpretation.    Cardiac Monitoring:  The patient was maintained on a cardiac monitor.  I personally viewed and interpreted the cardiac monitored which showed an underlying rhythm of: NSR 75 EKG non-ischemic   Medicines ordered:  I ordered medication including geodon, ativan  for aggressive behavior Reevaluation of the patient after these medicines showed that the patient improved I have reviewed the patients home medicines and have made adjustments as needed  Reevaluation:  After the interventions noted above I re-evaluated patient and found that they have :improved   Social Determinants of Health:  The patient's social determinants of health were a factor in the care of this patient    Problem List / ED Course:  Here for aggressive behavior, labs are at his baseline.  CT head without any intracranial etiology.  CT cervical spine is within normal limits.  Patient did receive Geodon and Ativan as he was aggressive and cooperative.  He is resting peacefully at this time.  He has medically clear for psychiatric evaluation. Patient's home medications have been ordered.    Dispostion:  After consideration of the diagnostic results and the patients response to treatment, I feel that the patent would benefit from psychiatric evaluation.     Portions of this note were generated with Scientist, clinical (histocompatibility and immunogenetics). Dictation errors may occur despite best attempts at proofreading.  Final Clinical Impression(s) / ED Diagnoses Final diagnoses:  Aggressive behavior    Rx / DC Orders ED Discharge Orders     None         Claude Manges, Cordelia Poche 04/02/22 2120    Virgina Norfolk, DO 04/02/22 2326

## 2022-04-02 NOTE — ED Notes (Signed)
Pt continues to yell out, hits door to room, staff attempt to redirect pt to stay in room

## 2022-04-02 NOTE — ED Triage Notes (Signed)
Pt BIB EMS due to psychiatric eval, mother states he has been hyperactive all day, hitting his head on the wall, wandering around. Pt takes elliquis for hx of PE. Hx of schizo, pt talking to himself, pt developmentally delayed and has MS. Axox4. VSS.

## 2022-04-03 ENCOUNTER — Telehealth: Payer: Self-pay | Admitting: *Deleted

## 2022-04-03 ENCOUNTER — Telehealth: Payer: Self-pay | Admitting: Surgery

## 2022-04-03 DIAGNOSIS — F201 Disorganized schizophrenia: Secondary | ICD-10-CM | POA: Diagnosis not present

## 2022-04-03 DIAGNOSIS — Z046 Encounter for general psychiatric examination, requested by authority: Secondary | ICD-10-CM

## 2022-04-03 DIAGNOSIS — F209 Schizophrenia, unspecified: Secondary | ICD-10-CM | POA: Diagnosis not present

## 2022-04-03 NOTE — Discharge Instructions (Addendum)
Follow-up as instructed by behavioral health or your family doctor

## 2022-04-03 NOTE — ED Notes (Signed)
Patient in room singing at this time. Made last phone call for the day.

## 2022-04-03 NOTE — Telephone Encounter (Signed)
Pt mom called stating pt will be running out of Rx today and requesting refills.  Mom states she contacted prescribing MD who states that [facility] should place order for refill being that he was with [place] over night and he would not refill/prescribe without seeing pt (appointment is 10/4).  She would like Rx sent to [facility]. RNCM forwarded request to psych NP and EDP. Psych NP states "He is established with outpatient psychiatry and it seems reasonable that they would continue with his meds.  No changes were made last night. I will reach out to them."

## 2022-04-03 NOTE — BH Assessment (Signed)
Per Malinda C. Motsinger, RN, pt is unable to engage due to being chemically sedated.   Clinician asked RN if she can let her know when the pt is able to engage.   Pt's RN agreed however expressed it may be first thing in the morning when pt is able to fully engage.    Vertell Novak, [place], [facility] Triage Specialist [phone]

## 2022-04-03 NOTE — Consult Note (Signed)
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 male patient admitted with "abnormal behaviors."  Pt was brought in by EMS accompanied by his mother for co "hyperactive all day, hitting his head and the wall and wandering" per nurse admission notes. Pt has a history of schizophrenia, MS, ADHD, developmental delay and pulmonary embolism currently takes eliquis.    HPI:   Patient seen via telepsych by this provider; chart reviewed and consulted with Dr. Dwyane Dee on 04/03/22.  On evaluation Brandon [name] reports he has "PTSD and Bipolar." It is somewhat difficult to understand him and he's asked several time to repeat himself. Pt is alert and oriented to person, place and time.  Pt states he at "[place]" reports his current age at "31" and states today is, "Thursday." Periodically displays some irritability when he cannot not recall the names of his medications but responds well to verbal direction; when given reassurance and support he participates in the assessment.  Pt reports he came to the hospital because he fell. States he did not hurt himself asks if he is going to be discharged.    He reports he slept good last night; appetite is good.   Pt denies suicidal or homicidal ideations; he denies AVH and does not appear to be responding to internal stimulus.  Per notes, pt did receive IM Geodon on 9/27@1921  for agitation, appears he was able to sleep afterwards.  Pt was medically evaluated and cleared, home medications  were restarted.     Collateral received from his mother, Aayden Cefalu who reports patient currently lives with her.  States she brought the patient to the emergency department yesterday for evaluation because she felt he was not acting like himself.  She reports progressive concerns that have been mounting over the past year or so.    States he has not been sleeping over the past 3 days; and he has not been focused and has had difficulty carrying on a conversation.  She also reports he had difficulty stating his name and his location.  States he's been working with his psychiatrist, Dr, [name], Carmelina Noun [facility] care manager; and also his neurologist Dr. Brunetta Jeans,  who historically believe his symptoms may be  due to his progressing MS but are doing additional tests to rule out medical contributions.  Ms. Schwager states patient's [facility] case manager no longer thinks he's appropriate for a group home setting and is working to get him to a level 3 residential placement.    On 9/27 Ms. Gentle took her son to a scheduled psych eval with Stage manager.  She reports patient did not participate and instead he was redirected to the emergency department.  She reports the pt kept repeating he was 31 years old, kept walking away from the examiner and would not answer questions.    She reports her son as medication compliant but she has some reservations about the amount of medications he currently takes.   She states she patient has not made suicidal or homicidal ideations.  From talking with her,  it appears she'd like to fast track some of his care.  Which is reasonable, but given there are no acute concerns, believe patient should continue to work with his outpatient care team for additional placement. She denies safety concern with taking pt home today.     Per ED Provider Admission Assessment 04/02/2022: History Schizophrenia, developmental delay    Chief Complaint  Patient presents with    Psychiatric Evaluation      FEDERICK [name] is a 31 y.o. male.   31 year old male with a past medical history of mental illness such as schizophrenia, developmental delay,ADHD presents to the ED via EMS for abnormal behavior.  According to EMS they were called out by mother, patient mom by not have taken medications.  He is acting hyperactive, hitting himself with a close fist, hitting his head on the wall.  Has struck his head on the wall multiple times although there is no obvious injury.  Patient is anticoagulated on Eliquis due to a history of recurrent pulmonary embolisms. He denies any complaint although is holding his chest with voicing pain.    The history is provided by the patient.        Past Psychiatric History: as outlined above  Risk to Self:  no Risk to Others:  no Prior Inpatient Therapy: yes  Prior Outpatient Therapy:  yes, sees Dr. [name] outpatient psychiatry; Was scheduled for a visit today given he is at the hospital, his appt is rescheduled to 10/4.    Past Medical History:  Past Medical History:  Diagnosis Date   ADHD (attention deficit hyperactivity disorder)    Bipolar 1 disorder (HCC)    Chronic back pain    Chronic constipation    Chronic neck pain    Hypertension    Multiple sclerosis ([place]) 05/20/2013   left sided weakness, dysarthria   Non-compliance    Obesity    Pulmonary embolism ([place])    Schizophrenia (HCC)    Stroke ([place])    left sided deficits - pt's mother denies this    Past Surgical History:  Procedure Laterality Date   NO PAST SURGERIES     None     RADIOLOGY WITH ANESTHESIA N/A 01/16/2021   Procedure: MRI WITH ANESTHESIA CERVICAL AND THORASIC SPINE WITH AND WITHOUT CONTRAST;  Surgeon: Radiologist, Medication, MD;  Location: [place];  Service: Radiology;  Laterality: N/A;   TOOTH EXTRACTION N/A 06/24/2019   Procedure: DENTAL RESTORATION/EXTRACTION OF TEETH NUMBER ONE, SIXTEEN, SEVENTEEN, NINETEEN, THIRTY-TWO;  Surgeon: Diona Browner,  DDS;  Location: [place];  Service: Oral Surgery;  Laterality: N/A;   Family History:  Family History  Problem Relation Age of Onset   Diabetes Mother    ADD / ADHD Brother    Family Psychiatric  History: denies Social History:  Social History   Substance and Sexual Activity  Alcohol Use Not Currently   Alcohol/week: 0.0 standard drinks of alcohol   Comment: "A little bit"      Social History   Substance and Sexual Activity  Drug Use Not Currently   Types: Marijuana   Comment: Last used: unknown     Social History   Socioeconomic History   Marital status: Single    Spouse name: Not on file   Number of children: 0   Years of education: 11th   Highest education level: Not on file  Occupational History   Occupation: unemployed    Employer: Chief of Staff    Comment: Disbaled  Tobacco Use  Smoking status: Every Day    Packs/day: 0.25    Types: Cigarettes   Smokeless tobacco: Never   Tobacco comments:    2 cigarettes a day  Vaping Use   Vaping Use: Never used  Substance and Sexual Activity   Alcohol use: Not Currently    Alcohol/week: 0.0 standard drinks of alcohol    Comment: "A little bit"    Drug use: Not Currently    Types: Marijuana    Comment: Last used: unknown    Sexual activity: Not on file  Other Topics Concern   Not on file  Social History Narrative   Patient lives at home with his mother.   Disabled.   Education 11 th grade .   Right handed.   Drinks caffeine occassionally   Social Determinants of Radio broadcast assistant Strain: Not on file  Food Insecurity: Not on file  Transportation Needs: Not on file  Physical Activity: Not on file  Stress: Not on file  Social Connections: Not on file   Additional Social History:    Allergies:  No Known Allergies  Labs:  Results for orders placed or performed during the hospital encounter of 04/02/22 (from the past 48 hour(s))  Resp Panel by RT-PCR (Flu A&B, Covid) Anterior Nasal Swab      Status: None   Collection Time: 04/02/22  6:29 PM   Specimen: Anterior Nasal Swab  Result Value Ref Range   SARS Coronavirus 2 by RT PCR NEGATIVE NEGATIVE    Comment: (NOTE) SARS-CoV-2 target nucleic acids are NOT DETECTED.  The SARS-CoV-2 RNA is generally detectable in upper respiratory specimens during the acute phase of infection. The lowest concentration of SARS-CoV-2 viral copies this assay can detect is 138 copies/mL. A negative result does not preclude SARS-Cov-2 infection and should not be used as the sole basis for treatment or other patient management decisions. A negative result may occur with  improper specimen collection/handling, submission of specimen other than nasopharyngeal swab, presence of viral mutation(s) within the areas targeted by this assay, and inadequate number of viral copies(<138 copies/mL). A negative result must be combined with clinical observations, patient history, and epidemiological information. The expected result is Negative.  Fact Sheet for Patients:  EntrepreneurPulse.com.au  Fact Sheet for Healthcare Providers:  IncredibleEmployment.be  This test is no t yet approved or cleared by the [place] FDA and  has been authorized for detection and/or diagnosis of SARS-CoV-2 by FDA under an Emergency Use Authorization (EUA). This EUA will remain  in effect (meaning this test can be used) for the duration of the COVID-19 declaration under Section 564(b)(1) of the Act, 21 U.S.C.section 360bbb-3(b)(1), unless the authorization is terminated  or revoked sooner.       Influenza A by PCR NEGATIVE NEGATIVE   Influenza B by PCR NEGATIVE NEGATIVE    Comment: (NOTE) The Xpert Xpress SARS-CoV-2/FLU/RSV plus assay is intended as an aid in the diagnosis of influenza from Nasopharyngeal swab specimens and should not be used as a sole basis for treatment. Nasal washings and aspirates are unacceptable for Xpert Xpress  SARS-CoV-2/FLU/RSV testing.  Fact Sheet for Patients: EntrepreneurPulse.com.au  Fact Sheet for Healthcare Providers: IncredibleEmployment.be  This test is not yet approved or cleared by the [place] FDA and has been authorized for detection and/or diagnosis of SARS-CoV-2 by FDA under an Emergency Use Authorization (EUA). This EUA will remain in effect (meaning this test can be used) for the duration of the COVID-19 declaration under Section 564(b)(1)  of the Act, 21 U.S.C. section 360bbb-3(b)(1), unless the authorization is terminated or revoked.  Performed at [facility], [place] [address] [place], [place] [address]   Ethanol     Status: None   Collection Time: 04/02/22  6:29 PM  Result Value Ref Range   Alcohol, Ethyl (B) <10 <10 mg/dL    Comment: (NOTE) Lowest detectable limit for serum alcohol is 10 mg/dL.  For medical purposes only. Performed at [facility], [place] [address], [place], [place] [id number]   Salicylate level     Status: Abnormal   Collection Time: 04/02/22  6:29 PM  Result Value Ref Range   Salicylate Lvl Q000111Q (L) 7.0 - 30.0 mg/dL    Comment: Performed at [facility] [address], [place], [place] [address]  Acetaminophen level     Status: Abnormal   Collection Time: 04/02/22  6:29 PM  Result Value Ref Range   Acetaminophen (Tylenol), Serum <10 (L) 10 - 30 ug/mL    Comment: (NOTE) Therapeutic concentrations vary significantly. A range of 10-30 ug/mL  may be an effective concentration for many patients. However, some  are best treated at concentrations outside of this range. Acetaminophen concentrations >150 ug/mL at 4 hours after ingestion  and >50 ug/mL at 12 hours after ingestion are often associated with  toxic reactions.  Performed at [facility], [place] [address], [place], [place] [address]   Comprehensive metabolic panel     Status: None   Collection Time: 04/02/22  6:39 PM   Result Value Ref Range   Sodium 140 135 - 145 mmol/L   Potassium 3.8 3.5 - 5.1 mmol/L   Chloride 108 98 - 111 mmol/L   CO2 22 22 - 32 mmol/L   Glucose, Bld 91 70 - 99 mg/dL    Comment: Glucose reference range applies only to samples taken after fasting for at least 8 hours.   BUN 12 6 - 20 mg/dL   Creatinine, Ser 0.88 0.61 - 1.24 mg/dL   Calcium 10.0 8.9 - 10.3 mg/dL   Total Protein 6.9 6.5 - 8.1 g/dL   Albumin 4.6 3.5 - 5.0 g/dL   AST 23 15 - 41 U/L   ALT 44 0 - 44 U/L   Alkaline Phosphatase 80 38 - 126 U/L   Total Bilirubin 0.7 0.3 - 1.2 mg/dL   GFR, Estimated >60 >60 mL/min    Comment: (NOTE) Calculated using the CKD-EPI Creatinine Equation (2021)    Anion gap 10 5 - 15    Comment: Performed at [facility] [address], [place], [place] [address]  CBC with Diff     Status: None   Collection Time: 04/02/22  6:39 PM  Result Value Ref Range   WBC 7.8 4.0 - 10.5 K/uL   RBC 5.13 4.22 - 5.81 MIL/uL   Hemoglobin 13.4 13.0 - 17.0 g/dL   HCT 41.8 39.0 - 52.0 %   MCV 81.5 80.0 - 100.0 fL   MCH 26.1 26.0 - 34.0 pg   MCHC 32.1 30.0 - 36.0 g/dL   RDW 13.3 11.5 - 15.5 %   Platelets 181 150 - 400 K/uL   nRBC 0.0 0.0 - 0.2 %   Neutrophils Relative % 56 %   Neutro Abs 4.4 1.7 - 7.7 K/uL   Lymphocytes Relative 32 %   Lymphs Abs 2.5 0.7 - 4.0 K/uL   Monocytes Relative 10 %   Monocytes Absolute 0.8 0.1 -  1.0 K/uL   Eosinophils Relative 1 %   Eosinophils Absolute 0.1 0.0 - 0.5 K/uL   Basophils Relative 1 %   Basophils Absolute 0.1 0.0 - 0.1 K/uL   Immature Granulocytes 0 %   Abs Immature Granulocytes 0.03 0.00 - 0.07 K/uL    Comment: Performed at [facility] [address] [place], [place] [address]    Medications:  No current facility-administered medications for this encounter.   Current Outpatient Medications  Medication Sig Dispense Refill   amantadine (SYMMETREL) 100 MG capsule Take 100 mg by mouth 2 (two) times daily.     amLODipine (NORVASC) 5 MG  tablet Take 5 mg by mouth daily.     apixaban (ELIQUIS) 5 MG TABS tablet Take 1 tablet (5 mg total) by mouth 2 (two) times daily. 60 tablet 0   benztropine (COGENTIN) 1 MG tablet Take 1 tablet (1 mg total) by mouth 2 (two) times daily. 60 tablet 0   divalproex (DEPAKOTE) 500 MG DR tablet Take 1 tablet (500 mg total) by mouth every 12 (twelve) hours. 60 tablet 0   docusate sodium (COLACE) 100 MG capsule Take 1 capsule (100 mg total) by mouth 2 (two) times daily. 60 capsule 0   haloperidol (HALDOL) 10 MG tablet Take 1 tablet (10 mg total) by mouth 2 (two) times daily. 60 tablet 0   loratadine (CLARITIN) 10 MG tablet Take 1 tablet (10 mg total) by mouth daily as needed for allergies. 30 tablet 0   losartan (COZAAR) 50 MG tablet Take 1 tablet (50 mg total) by mouth 2 (two) times daily. 60 tablet 0   LUMIGAN 0.01 % SOLN Place 1 drop into both eyes at bedtime. 2.5 mL 0   metFORMIN (GLUCOPHAGE-XR) 500 MG 24 hr tablet Take 500 mg by mouth 2 (two) times daily as needed.     nicotine (NICODERM CQ - DOSED IN MG/24 HOURS) 14 mg/24hr patch Place 1 patch (14 mg total) onto the skin daily. 28 patch 0   ocrelizumab (OCREVUS) 300 MG/10ML injection Inject 2 vials ( 20 MLs) into the vein every 6 months 20 mL 0   OLANZapine (ZYPREXA) 15 MG tablet Take 1 tablet (15 mg total) by mouth at bedtime. 30 tablet 0   temazepam (RESTORIL) 15 MG capsule Take 1 capsule (15 mg total) by mouth at bedtime. 30 capsule 0   topiramate (TOPAMAX) 25 MG tablet TAKE ONE TABLET BY MOUTH TWICE A DAY 60 tablet 4    Musculoskeletal:pt ambulates independently and moves all extremities Strength & Muscle Tone: within normal limits Gait & Station: normal Patient leans: N/A   Psychiatric Specialty Exam:  Presentation  General Appearance: Appropriate for Environment; Casual  Eye Contact:Fair  Speech:Clear and Coherent; Normal Rate  Speech Volume:Normal  Handedness:Right   Mood and Affect   Mood:Euthymic  Affect:Congruent   Thought Process  Thought Processes:Linear; Irrevelant  Descriptions of Associations:Intact  Orientation:Partial  Thought Content:Logical  History of Schizophrenia/Schizoaffective disorder:Yes  Duration of Psychotic Symptoms:Greater than six months  Hallucinations:No data recorded Ideas of Reference:None  Suicidal Thoughts:No data recorded Homicidal Thoughts:No data recorded  Sensorium  Memory:Immediate Poor; Recent Poor; Remote Poor  Judgment:Impaired  Insight:Lacking   Executive Functions  Concentration:Poor  Attention Span:Poor  Recall:Poor  Fund of Knowledge:Poor  Language:Poor   Psychomotor Activity  Psychomotor Activity:No data recorded  Assets  Assets:Resilience; Physical Health; Social Support   Sleep  Sleep:No data recorded   Physical Exam: Physical Exam Constitutional:      Appearance: Normal  appearance.  Cardiovascular:     Rate and Rhythm: Normal rate.  Pulmonary:     Effort: Pulmonary effort is normal.  Musculoskeletal:     Cervical back: Normal range of motion.  Neurological:     Mental Status: He is alert and oriented to person, place, and time.  Psychiatric:        Attention and Perception: Attention and perception normal.        Mood and Affect: Mood is depressed. Affect is blunt.        Speech: Speech is slurred (speech somewhat slurred but pt this is his baseline; UDS is negative for alcohol or illicit substances).        Behavior: Behavior is cooperative.        Thought Content: Thought content normal. Thought content does not include suicidal ideation. Thought content does not include suicidal plan.        Judgment: Judgment is impulsive.    Review of Systems  Constitutional: Negative.   HENT: Negative.    Eyes: Negative.   Respiratory: Negative.    Cardiovascular: Negative.   Gastrointestinal: Negative.   Genitourinary: Negative.   Musculoskeletal: Negative.   Neurological:  Negative.  Negative for dizziness, tremors, speech change, focal weakness and weakness.  Endo/Heme/Allergies: Negative.   Psychiatric/Behavioral:  Positive for depression. Negative for memory loss and suicidal ideas. The patient does not have insomnia.    Blood pressure 133/86, pulse 76, temperature 97.8 F (36.6 C), temperature source Oral, resp. rate 18, SpO2 99 %. There is no height or weight on file to calculate BMI.  Treatment Plan Summary: Pt is alert and oriented to person, place and time.  Periodically displays some irritability associated with memory deficits but responds well to verbal direction; when given reassurance and support he participates in the assessment.  He is clear and coherent, his mother reported concerns of pt being unaware of his age and orientation concerns but these seem to have improved since hospitalization.  Pt was able to sleep last night which may have also be a contributing factor. To his cognitive improvement.     Patient was evaluated and medically and psychiatrically cleared.    He has some chronic concerns that are progressive for the past year but no acute changes or safety concerns demonstrated today.  Spoke in detail with pt's mother and safety planning completed; She would like to have pt placed at level 3 residential facility, which may be appropriate and can be completed from an outpatient setting.  Recommended she continue to work with his outpatient team regarding this.  Patient has outpatient psych appt with Dr. [name] on 04/09/2022, states she cannot get med refills under pt is seen.  She requests refills on haldol 10mg  po BID and temezepam 15mg  po qhs.  30 day refills were called to [facility].  She did not need refills on other medications.    As per above, patient does not meet criteria for involuntary psychiatric admissions and states he is not interested in voluntary admissions.  He is psych cleared and recommended he follow up with outpatient  resources added to discharge AVS.    We reviewed the importance of substance abuse abstinence; potential negative impact substance abuse can have on relationships and level of functioning.  Also reviewed the importance of medication compliance.     Disposition: No evidence of imminent risk to self or others at present.   Patient does not meet criteria for psychiatric inpatient admission. Supportive therapy provided about  ongoing stressors. Discussed crisis plan, support from social network, calling 911, coming to the Emergency Department, and calling Suicide Hotline.  This service was provided via telemedicine using a 2-way, interactive audio and video technology.  Names of all persons participating in this telemedicine service and their role in this encounter. Name: Carlyon Prows Role: Patient  Name: Merlyn Lot Role: PMHNP  Name: Purvis Kilts Role: Pt's mother    Mallie Darting, NP 04/03/2022 7:21 PM

## 2022-04-03 NOTE — ED Notes (Signed)
Patient wandering, has asked to use the phone once but never picked up the phone. Attempted to approach the counter when another patient was on the phone had to be redirected to his room. Patient talking and laughing to self.

## 2022-04-03 NOTE — BH Assessment (Signed)
Clinician messaged Malinda C. Motsinger, RN: "Hey. It's Trey with TTS. Is the pt able to engage in the assessment, if so the pt will need to be placed in a private room. Is the pt under IVC? Also is the pt medically cleared?"   Clinician awaiting response.   Vertell Novak, [place], [facility], [facility] Triage Specialist [phone]

## 2022-04-03 NOTE — Telephone Encounter (Signed)
Received call from patient's mom concerning medications refill, sent message to [facility] NP and she will follow up. No further ED CM needs identified.

## 2022-04-03 NOTE — ED Provider Notes (Signed)
Emergency Medicine Observation Re-evaluation Note  Brandon [name] is a 31 y.o. male, seen on rounds today.  Pt initially presented to the ED for complaints of Psychiatric Evaluation Currently, the patient is schizophrenic and awaiting TTS consult.  Physical Exam  BP 133/86 (BP Location: Left Wrist)   Pulse 76   Temp 97.8 F (36.6 C) (Oral)   Resp 18   SpO2 99%  Physical Exam Vital signs stable patient sleeping now  ED Course / MDM  EKG:EKG Interpretation  Date/Time:  Wednesday April 02 2022 18:43:37 EDT Ventricular Rate:  78 PR Interval:  139 QRS Duration: 105 QT Interval:  355 QTC Calculation: 405 R Axis:   19 Text Interpretation: Sinus rhythm Normal ECG When compared with ECG of 11/19/2021, No significant change was found Confirmed by Delora Fuel ([id number]) on 04/03/2022 6:05:01 AM  I have reviewed the labs performed to date as well as medications administered while in observation.  Recent changes in the last 24 hours include none  Plan  Current plan is for TTS consult.    Milton Ferguson, MD 04/03/22 [phone]

## 2022-04-03 NOTE — ED Notes (Signed)
Patient legal guardian given discharge instructions, all questions answered. Patient in possession of all belongings, directed to the discharge area.

## 2022-04-03 NOTE — ED Notes (Signed)
Patient eating lunch.

## 2022-04-09 ENCOUNTER — Emergency Department (HOSPITAL_COMMUNITY)
Admission: EM | Admit: 2022-04-09 | Discharge: 2022-04-14 | Disposition: A | Discharge status: Home / Self Care | Payer: Medicare Other | Attending: Emergency Medicine | Admitting: Emergency Medicine

## 2022-04-09 ENCOUNTER — Other Ambulatory Visit: Payer: Self-pay

## 2022-04-09 DIAGNOSIS — Z79899 Other long term (current) drug therapy: Secondary | ICD-10-CM | POA: Insufficient documentation

## 2022-04-09 DIAGNOSIS — I10 Essential (primary) hypertension: Secondary | ICD-10-CM | POA: Insufficient documentation

## 2022-04-09 DIAGNOSIS — Z20822 Contact with and (suspected) exposure to covid-19: Secondary | ICD-10-CM | POA: Insufficient documentation

## 2022-04-09 DIAGNOSIS — F201 Disorganized schizophrenia: Secondary | ICD-10-CM | POA: Insufficient documentation

## 2022-04-09 DIAGNOSIS — Z7901 Long term (current) use of anticoagulants: Secondary | ICD-10-CM | POA: Insufficient documentation

## 2022-04-09 DIAGNOSIS — R4689 Other symptoms and signs involving appearance and behavior: Secondary | ICD-10-CM

## 2022-04-09 DIAGNOSIS — Y900 Blood alcohol level of less than 20 mg/100 ml: Secondary | ICD-10-CM | POA: Insufficient documentation

## 2022-04-09 DIAGNOSIS — R456 Violent behavior: Secondary | ICD-10-CM | POA: Diagnosis present
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
## 2022-04-10 DIAGNOSIS — F201 Disorganized schizophrenia: Secondary | ICD-10-CM | POA: Diagnosis not present

## 2022-04-10 LAB — CBC WITH DIFFERENTIAL/PLATELET
Abs Immature Granulocytes: 0.04 10*3/uL (ref 0.00–0.07)
Basophils Absolute: 0 10*3/uL (ref 0.0–0.1)
Basophils Relative: 1 %
Eosinophils Absolute: 0.3 10*3/uL (ref 0.0–0.5)
Eosinophils Relative: 4 %
HCT: 41.1 % (ref 39.0–52.0)
Hemoglobin: 13.2 g/dL (ref 13.0–17.0)
Immature Granulocytes: 1 %
Lymphocytes Relative: 35 %
Lymphs Abs: 2.9 10*3/uL (ref 0.7–4.0)
MCH: 26.8 pg (ref 26.0–34.0)
MCHC: 32.1 g/dL (ref 30.0–36.0)
MCV: 83.4 fL (ref 80.0–100.0)
Monocytes Absolute: 0.7 10*3/uL (ref 0.1–1.0)
Monocytes Relative: 8 %
Neutro Abs: 4.2 10*3/uL (ref 1.7–7.7)
Neutrophils Relative %: 51 %
Platelets: 180 10*3/uL (ref 150–400)
RBC: 4.93 MIL/uL (ref 4.22–5.81)
RDW: 13.6 % (ref 11.5–15.5)
WBC: 8.1 10*3/uL (ref 4.0–10.5)
nRBC: 0 % (ref 0.0–0.2)

## 2022-04-10 LAB — RAPID URINE DRUG SCREEN, HOSP PERFORMED
Amphetamines: NOT DETECTED
Barbiturates: NOT DETECTED
Benzodiazepines: POSITIVE — AB
Cocaine: NOT DETECTED
Opiates: NOT DETECTED
Tetrahydrocannabinol: NOT DETECTED

## 2022-04-10 LAB — COMPREHENSIVE METABOLIC PANEL
ALT: 40 U/L (ref 0–44)
AST: 21 U/L (ref 15–41)
Albumin: 4.1 g/dL (ref 3.5–5.0)
Alkaline Phosphatase: 76 U/L (ref 38–126)
Anion gap: 8 (ref 5–15)
BUN: 15 mg/dL (ref 6–20)
CO2: 20 mmol/L — ABNORMAL LOW (ref 22–32)
Calcium: 8.8 mg/dL — ABNORMAL LOW (ref 8.9–10.3)
Chloride: 109 mmol/L (ref 98–111)
Creatinine, Ser: 1.01 mg/dL (ref 0.61–1.24)
GFR, Estimated: >60 mL/min (ref >60)
Glucose, Bld: 107 mg/dL — ABNORMAL HIGH (ref 70–99)
Potassium: 4 mmol/L (ref 3.5–5.1)
Sodium: 137 mmol/L (ref 135–145)
Total Bilirubin: 0.4 mg/dL (ref 0.3–1.2)
Total Protein: 6.4 g/dL — ABNORMAL LOW (ref 6.5–8.1)

## 2022-04-10 LAB — RESP PANEL BY RT-PCR (FLU A&B, COVID) ARPGX2
Influenza A by PCR: NEGATIVE
Influenza B by PCR: NEGATIVE
SARS Coronavirus 2 by RT PCR: NEGATIVE

## 2022-04-10 LAB — ETHANOL: Alcohol, Ethyl (B): <10 mg/dL (ref <10)

## 2022-04-10 LAB — VALPROIC ACID LEVEL: Valproic Acid Lvl: <10 ug/mL — ABNORMAL LOW (ref 50.0–100.0)

## 2022-04-10 MED ORDER — ZIPRASIDONE MESYLATE 20 MG IM SOLR
20.0000 mg | Freq: Once | INTRAMUSCULAR | Status: AC
  Administered 2022-04-10: 20 mg via INTRAMUSCULAR
  Filled 2022-04-10: qty 20

## 2022-04-10 MED ORDER — STERILE WATER FOR INJECTION IJ SOLN
INTRAMUSCULAR | Status: AC
  Filled 2022-04-10: qty 10

## 2022-04-10 MED ORDER — AMANTADINE HCL 100 MG PO CAPS
100.0000 mg | ORAL_CAPSULE | Freq: Two times a day (BID) | ORAL | Status: DC
  Administered 2022-04-11 – 2022-04-14 (×6): 100 mg via ORAL
  Filled 2022-04-10 (×7): qty 1

## 2022-04-10 MED ORDER — LOSARTAN POTASSIUM 50 MG PO TABS
100.0000 mg | ORAL_TABLET | Freq: Every day | ORAL | Status: DC
  Administered 2022-04-10 – 2022-04-14 (×5): 100 mg via ORAL
  Filled 2022-04-10 (×2): qty 2
  Filled 2022-04-10: qty 4
  Filled 2022-04-10 (×2): qty 2

## 2022-04-10 MED ORDER — OLANZAPINE 5 MG PO TABS
15.0000 mg | ORAL_TABLET | Freq: Every day | ORAL | Status: DC
  Administered 2022-04-11: 15 mg via ORAL
  Filled 2022-04-10: qty 1

## 2022-04-10 MED ORDER — HALOPERIDOL 5 MG PO TABS
10.0000 mg | ORAL_TABLET | Freq: Two times a day (BID) | ORAL | Status: DC
  Administered 2022-04-10 – 2022-04-14 (×9): 10 mg via ORAL
  Filled 2022-04-10 (×9): qty 2

## 2022-04-10 MED ORDER — BENZTROPINE MESYLATE 1 MG PO TABS: 1.0000 mg | ORAL_TABLET | Freq: Two times a day (BID) | ORAL | Status: DC

## 2022-04-10 MED ORDER — DIVALPROEX SODIUM 500 MG PO DR TAB
500.0000 mg | DELAYED_RELEASE_TABLET | Freq: Two times a day (BID) | ORAL | Status: DC
  Administered 2022-04-10 – 2022-04-14 (×9): 500 mg via ORAL
  Filled 2022-04-10 (×9): qty 1

## 2022-04-10 NOTE — ED Notes (Signed)
Up to b/r, changing into srubs

## 2022-04-10 NOTE — ED Notes (Signed)
Patient became agitated, hitting wall and glass in his room and came out of his room and started hitting wall. This nurse messaged MD for PRN meds. JRPRN

## 2022-04-10 NOTE — ED Notes (Signed)
Sitting up on edge of bed eating breakfast. Alert, NAD, calm, reserved, guarded, interactive, soft spoken. Acknowledges "got some sleep".

## 2022-04-10 NOTE — ED Notes (Signed)
Pt is cursing and popping his knuckles. Pt keeps having outbursts getting upset.
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
## 2022-04-10 NOTE — ED Notes (Signed)
Patient in bed. Medications and juice given. Patient cooperative. JRPRN

## 2022-04-10 NOTE — ED Notes (Signed)
Sitter assisted patient to restroom.  Attempted to collect urine sample.  Patient did not urinate in cup at this time.  Will attempt again shortly
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
## 2022-04-10 NOTE — ED Notes (Signed)
Refusing to undress. Redirected. Given instructions. Sitter and security present.

## 2022-04-10 NOTE — ED Notes (Signed)
Reluctant to get into scrubs. Directed back to b/r to remove clothes. Sitter present. Security called for stand by assistance. CN aware.
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
## 2022-04-10 NOTE — BH Assessment (Signed)
Per Dr. Kumar in the 9am morning huddle/bed meeting, Provider to assess. No TTS CCA is needed at this time. 

## 2022-04-10 NOTE — ED Provider Notes (Signed)
Emergency Medicine Observation Re-evaluation Note  Brandon [name] is a 31 y.o. male, seen on rounds today.  Pt initially presented to the ED for complaints of Aggressive Behavior Currently, the patient is waiting in the ED for TTS evaluation.  Physical Exam  BP (!) 144/88 (BP Location: Right Arm)   Pulse 97   Temp 98.7 F (37.1 C) (Oral)   Resp 18   SpO2 99%  Physical Exam General: No acute distress, eating breakfast this morning Cardiac: Regular rate Lungs: Normal effort Psych: Calm  ED Course / MDM  EKG:   I have reviewed the labs performed to date as well as medications administered while in observation.  Recent changes in the last 24 hours include initial ED evaluation as documented by PA Sanders..  Plan  Current plan is for TTS evaluation.    Dorie Rank, MD 04/10/22 [phone]

## 2022-04-10 NOTE — Progress Notes (Signed)
Inpatient Behavioral Health Placement  Pt meets inpatient criteria per Lindon Romp, NP. Per referral was sent out. Referral was sent to the following facilities;    Destination Service Provider Address Phone Fax  [facility]  [place], [place] [address] [phone]  [facility]  [address] [place] [address] [place]  [facility]  [address] [place] [address] [phone]  [facility]  [place], [place] [address] [phone]  [place] [place] [address] [place]  [facility]  [address] [place] [address] [phone]  [facility]  [address], [place] [address] [phone]  [place] [address], [place] [address] [phone]  [facility]  [address], [place] [address] [phone] [place]  [address] [place] [address] [phone] [facility]  [address] [place] [address] [phone] [facility]  [address] [place] [address] [place]  [address], [place] [address] [phone] [facility]  [address] [place] [address] [phone] [facility]  [address] [place] [address] [phone] [place], [place] [address] [phone]   Situation ongoing,  CSW will follow up.   Benjaman Kindler, MSW, LCSWA 04/10/2022  @ 11:05 PM

## 2022-04-11 DIAGNOSIS — F201 Disorganized schizophrenia: Secondary | ICD-10-CM | POA: Diagnosis not present

## 2022-04-11 MED ORDER — APIXABAN 5 MG PO TABS
5.0000 mg | ORAL_TABLET | Freq: Two times a day (BID) | ORAL | Status: DC
  Administered 2022-04-11 – 2022-04-14 (×6): 5 mg via ORAL
  Filled 2022-04-11 (×9): qty 1

## 2022-04-11 MED ORDER — BENZTROPINE MESYLATE 1 MG PO TABS
1.0000 mg | ORAL_TABLET | Freq: Two times a day (BID) | ORAL | Status: DC
  Administered 2022-04-11 – 2022-04-13 (×5): 1 mg via ORAL
  Filled 2022-04-11 (×5): qty 1

## 2022-04-11 MED ORDER — HALOPERIDOL LACTATE 5 MG/ML IJ SOLN: 5.0000 mg | Freq: Three times a day (TID) | INTRAMUSCULAR | Status: DC | PRN

## 2022-04-11 MED ORDER — HALOPERIDOL 5 MG PO TABS
5.0000 mg | ORAL_TABLET | Freq: Once | ORAL | Status: AC
  Administered 2022-04-11: 5 mg via ORAL
  Filled 2022-04-11: qty 1

## 2022-04-11 MED ORDER — HALOPERIDOL 5 MG PO TABS: 5.0000 mg | ORAL_TABLET | Freq: Three times a day (TID) | ORAL | Status: DC | PRN
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
## 2022-04-11 NOTE — ED Notes (Signed)
Patient alert this shift. Patient medication compliant. Patient rambling and disorganized. Randomly making noises.  Laughing inappropriately. Affect incongruent.  Guarded and slow to respond.

## 2022-04-11 NOTE — ED Notes (Signed)
Patient coming out of room, agitated, trying to go in other patient room, thinks wants to fight.   Patient redirected and reassured.   Patient currently lying down.

## 2022-04-11 NOTE — ED Provider Notes (Signed)
Emergency Medicine Observation Re-evaluation Note  Brandon [name] is a 31 y.o. male, seen on rounds today.  Pt initially presented to the ED for complaints of Aggressive Behavior Currently, the patient is awaiting placement to psychiatric hospital for schizophrenia and aggressive behavior  Physical Exam  BP 138/76 (BP Location: Right Arm)   Pulse 96   Temp 98.6 F (37 C) (Oral)   Resp 18   SpO2 98%  Physical Exam Resting in no acute distress  ED Course / MDM  EKG:   I have reviewed the labs performed to date as well as medications administered while in observation.  Recent changes in the last 24 hours include none.  Plan  Current plan is for placement to psychiatric hospital for schizophrenia and aggressive behavior.    Milton Ferguson, MD 04/11/22 [id number]

## 2022-04-12 DIAGNOSIS — F201 Disorganized schizophrenia: Secondary | ICD-10-CM

## 2022-04-12 MED ORDER — OLANZAPINE 10 MG PO TABS
10.0000 mg | ORAL_TABLET | Freq: Every day | ORAL | Status: DC
  Administered 2022-04-12 – 2022-04-13 (×2): 10 mg via ORAL
  Filled 2022-04-12 (×2): qty 1

## 2022-04-12 NOTE — Progress Notes (Signed)
Per Lindon Romp, NP, patient meets criteria for inpatient treatment. There are no available beds at [facility] today. CSW faxed referrals to the following facilities for review:  [facility], [place] [address] [place] [address] [phone] --  [address] [place] [address] [phone] --  [facility]  Pending - Request Sent N/A [address] [place] [address] [phone] --  [facility]  Pending - Request Sent N/A [address] [place] [address] [phone] --  [facility]  Pending - Request Sent N/A [address], [place] [address] [phone] --  [facility]  Pending - Request Sent N/A [address] [place] [address] [phone] --  [facility]  Pending - Request Sent N/A [place] [address] [place] [address] [phone] --  [facility]  Pending - Request Sent N/A [address] [place] [address] [phone] --  [place] N/A [address] [place] [address] [phone] --  [facility]  Pending - Request Sent N/A [address] [place] [address] [phone] --  [facility]  Pending - Request Sent N/A [address] [place] [address] [phone] --  [facility]  Pending - Request Sent N/A [place] [place] [address] [phone] --  [facility]  Pending - Request Sent N/A [address], [place] [address] [phone] --  [place] N/A [address], [place] [address] [phone] --  [facility]  Pending - Request Sent N/A [address] [place] [address] [phone] --  [facility]  Pending - No Request Sent N/A [address], [place] [address] [phone] --  [facility]  Pending - No Request Sent N/A [address], [place] [address] [phone] --  [facility]  Pending - No Request Sent N/A [address] [place] [address] [phone] --   TTS will continue to seek bed placement.  [place], MSW, [place] Phone: [phone] Disposition/TOC

## 2022-04-12 NOTE — Progress Notes (Cosign Needed Addendum)
[facility] Psych ED Progress Note  04/12/2022 4:08 PM Brandon [name]  MRN:  [id number]   Principal Problem: Schizophrenia, disorganized type [facility]) Diagnosis:  Principal Problem:   Schizophrenia, disorganized type [facility]) Active Problems:   Aggressive behavior   ED Assessment Time Calculation: Start Time: 0930 Stop Time: 0940 Total Time in Minutes (Assessment Completion): 10  Subjective:   Brandon [name] is a 31 y.o. male patient admitted with a history of hypertension, MS, intellectual disability, and schizophrenia who presented to the ED exhibiting aggressive behavior.   HPI:  On evaluation today, patient is alert. He is partially disoriented to time. Speech is garbled at times, but coherent. Mood is depressed and affect is congruent with mood. Thought process is coherent and linear. Thought content is logical. Denies auditory and visual hallucinations. However, patient has continued to laugh out loud at times and talk to himself.  There was no apparent evidence of delusional thought content. Denies suicidal ideations. Denies homicidal ideations.  Patient observed several times a day sleeping.  Nursing staff report the patient has been mostly sleeping today, he does get up to use the restroom and eat his meals.  Nursing staff also reported the patient slept through the night last night.   Past Psychiatric History: schizophrenia, MS, ADHD, developmental delay  [place] Scale:  [place] ED from 04/09/2022 in [place] DEPT ED from 04/02/2022 in [place] ED from 11/16/2021 in [place] No Risk No Risk No Risk       Past Medical History:  Past Medical History:  Diagnosis Date   ADHD (attention deficit hyperactivity disorder)    Bipolar 1 disorder (HCC)    Chronic back pain    Chronic constipation    Chronic neck pain    Hypertension    Multiple  sclerosis ([place]) 05/20/2013   left sided weakness, dysarthria   Non-compliance    Obesity    Pulmonary embolism ([place])    Schizophrenia (HCC)    Stroke ([place])    left sided deficits - pt's mother denies this    Past Surgical History:  Procedure Laterality Date   NO PAST SURGERIES     None     RADIOLOGY WITH ANESTHESIA N/A 01/16/2021   Procedure: MRI WITH ANESTHESIA CERVICAL AND THORASIC SPINE WITH AND WITHOUT CONTRAST;  Surgeon: Radiologist, Medication, MD;  Location: [place];  Service: Radiology;  Laterality: N/A;   TOOTH EXTRACTION N/A 06/24/2019   Procedure: DENTAL RESTORATION/EXTRACTION OF TEETH NUMBER ONE, SIXTEEN, SEVENTEEN, NINETEEN, THIRTY-TWO;  Surgeon: Diona Browner, DDS;  Location: [place];  Service: Oral Surgery;  Laterality: N/A;   Family History:  Family History  Problem Relation Age of Onset   Diabetes Mother    ADD / ADHD Brother     Social History:  Social History   Substance and Sexual Activity  Alcohol Use Not Currently   Alcohol/week: 0.0 standard drinks of alcohol   Comment: "A little bit"      Social History   Substance and Sexual Activity  Drug Use Not Currently   Types: Marijuana   Comment: Last used: unknown     Social History   Socioeconomic History   Marital status: Single    Spouse name: Not on file   Number of children: 0   Years of education: 11th   Highest education level: Not on file  Occupational History   Occupation: unemployed    Employer: Animal nutritionist  [employer]    Comment: Disbaled  Tobacco Use   Smoking status: Every Day    Packs/day: 0.25    Types: Cigarettes   Smokeless tobacco: Never   Tobacco comments:    2 cigarettes a day  Vaping Use   Vaping Use: Never used  Substance and Sexual Activity   Alcohol use: Not Currently    Alcohol/week: 0.0 standard drinks of alcohol    Comment: "A little bit"    Drug use: Not Currently    Types: Marijuana    Comment: Last used: unknown    Sexual activity: Not on file  Other Topics Concern    Not on file  Social History Narrative   Patient lives at home with his mother.   Disabled.   Education 11 th grade .   Right handed.   Drinks caffeine occassionally   Social Determinants of Radio broadcast assistant Strain: Not on file  Food Insecurity: Not on file  Transportation Needs: Not on file  Physical Activity: Not on file  Stress: Not on file  Social Connections: Not on file    Sleep: Fair  Appetite:  Good  Current Medications: Current Facility-Administered Medications  Medication Dose Route Frequency Provider Last Rate Last Admin   amantadine (SYMMETREL) capsule 100 mg  100 mg Oral BID Lindon Romp A, NP   100 mg at 04/12/22 1048   apixaban (ELIQUIS) tablet 5 mg  5 mg Oral BID Milton Ferguson, MD   5 mg at 04/12/22 1048   benztropine (COGENTIN) tablet 1 mg  1 mg Oral BID Lindon Romp A, NP   1 mg at 04/12/22 1048   divalproex (DEPAKOTE) DR tablet 500 mg  500 mg Oral Q12H Dorie Rank, MD   500 mg at 04/12/22 1048   haloperidol (HALDOL) tablet 10 mg  10 mg Oral BID Dorie Rank, MD   10 mg at 04/12/22 1047   haloperidol (HALDOL) tablet 5 mg  5 mg Oral Q8H PRN Rozetta Nunnery, NP       Or   haloperidol lactate (HALDOL) injection 5 mg  5 mg Intramuscular Q8H PRN Lindon Romp A, NP       losartan (COZAAR) tablet 100 mg  100 mg Oral Daily Dorie Rank, MD   100 mg at 04/12/22 1048   OLANZapine (ZYPREXA) tablet 15 mg  15 mg Oral QHS Lindon Romp A, NP   15 mg at 04/11/22 2025   Current Outpatient Medications  Medication Sig Dispense Refill   amantadine (SYMMETREL) 100 MG capsule Take 100 mg by mouth 2 (two) times daily.     benztropine (COGENTIN) 1 MG tablet Take 1 tablet (1 mg total) by mouth 2 (two) times daily. 60 tablet 0   haloperidol (HALDOL) 10 MG tablet Take 1 tablet (10 mg total) by mouth 2 (two) times daily. 60 tablet 0   OLANZapine (ZYPREXA) 15 MG tablet Take 1 tablet (15 mg total) by mouth at bedtime. 30 tablet 0   amLODipine (NORVASC) 5 MG tablet Take 5 mg by  mouth daily.     apixaban (ELIQUIS) 5 MG TABS tablet Take 1 tablet (5 mg total) by mouth 2 (two) times daily. 60 tablet 0   divalproex (DEPAKOTE) 500 MG DR tablet Take 1 tablet (500 mg total) by mouth every 12 (twelve) hours. 60 tablet 0   docusate sodium (COLACE) 100 MG capsule Take 1 capsule (100 mg total) by mouth 2 (two) times daily. 60 capsule 0   hydrOXYzine (ATARAX)  25 MG tablet Take 25 mg by mouth 3 (three) times daily.     loratadine (CLARITIN) 10 MG tablet Take 1 tablet (10 mg total) by mouth daily as needed for allergies. 30 tablet 0   LORazepam (ATIVAN) 0.5 MG tablet Take 0.5 mg by mouth 2 (two) times daily as needed for anxiety or sedation.     losartan (COZAAR) 100 MG tablet Take 100 mg by mouth daily.     losartan (COZAAR) 50 MG tablet Take 1 tablet (50 mg total) by mouth 2 (two) times daily. 60 tablet 0   LUMIGAN 0.01 % SOLN Place 1 drop into both eyes at bedtime. 2.5 mL 0   metFORMIN (GLUCOPHAGE-XR) 500 MG 24 hr tablet Take 500 mg by mouth 2 (two) times daily as needed.     nicotine (NICODERM CQ - DOSED IN MG/24 HOURS) 14 mg/24hr patch Place 1 patch (14 mg total) onto the skin daily. 28 patch 0   ocrelizumab (OCREVUS) 300 MG/10ML injection Inject 2 vials ( 20 MLs) into the vein every 6 months (Patient taking differently: Inject 600 mg into the vein every 6 (six) months.) 20 mL 0   temazepam (RESTORIL) 15 MG capsule Take 1 capsule (15 mg total) by mouth at bedtime. 30 capsule 0   temazepam (RESTORIL) 22.5 MG capsule Take 22.5 mg by mouth at bedtime as needed for sleep.     topiramate (TOPAMAX) 25 MG tablet TAKE ONE TABLET BY MOUTH TWICE A DAY 60 tablet 4    Lab Results:  No results found for this or any previous visit (from the past 48 hour(s)).   Blood Alcohol level:  Lab Results  Component Value Date   ETH <10 04/10/2022   ETH <10 04/02/2022    Physical Findings:  CIWA:    COWS:     Musculoskeletal: Strength & Muscle Tone: within normal limits Gait & Station:  normal Patient leans: N/A  Psychiatric Specialty Exam:  Presentation  General Appearance:  Appropriate for Environment; Casual  Eye Contact: Fair  Speech: Garbled  Speech Volume: Decreased  Handedness: Right   Mood and Affect  Mood: Depressed  Affect: Congruent   Thought Process  Thought Processes: Linear  Descriptions of Associations:Intact  Orientation:Partial  Thought Content:Logical  History of Schizophrenia/Schizoaffective disorder:Yes  Duration of Psychotic Symptoms:Greater than six months  Hallucinations:Hallucinations: Other (comment) (Patient denies)  Ideas of Reference:None  Suicidal Thoughts:Suicidal Thoughts: No  Homicidal Thoughts:Homicidal Thoughts: No   Sensorium  Memory: Immediate Fair; Recent Fair; Remote Fair  Judgment: Impaired  Insight: Lacking   Executive Functions  Concentration: Fair  Attention Span: Fair  Recall: [employer] of Knowledge: Fair  Language: Fair   Psychomotor Activity  Psychomotor Activity: Psychomotor Activity: Normal   Assets  Assets: Financial Resources/Insurance; Housing; Social Support   Sleep  Sleep: Sleep: Fair    Physical Exam: Physical Exam Constitutional:      General: He is not in acute distress.    Appearance: He is not ill-appearing, toxic-appearing or diaphoretic.  HENT:     Right Ear: External ear normal.     Left Ear: External ear normal.  Eyes:     General:        Right eye: No discharge.        Left eye: No discharge.  Cardiovascular:     Rate and Rhythm: Normal rate.  Pulmonary:     Effort: Pulmonary effort is normal. No respiratory distress.  Musculoskeletal:        General: Normal  range of motion.  Neurological:     Mental Status: He is alert.  Psychiatric:        Thought Content: Thought content is not paranoid or delusional. Thought content does not include homicidal or suicidal ideation.    Review of Systems  Respiratory:  Negative for  cough and shortness of breath.   Cardiovascular:  Negative for chest pain.  Gastrointestinal:  Negative for diarrhea, nausea and vomiting.  Psychiatric/Behavioral:  Positive for depression. Negative for memory loss, substance abuse and suicidal ideas. The patient has insomnia.     Blood pressure (!) 135/91, pulse 93, temperature 97.6 F (36.4 C), temperature source Oral, resp. rate 18, SpO2 97 %. There is no height or weight on file to calculate BMI.   Medical Decision Making: On evaluation today, patient is alert. He is partially disoriented to time. Speech is garbled at times, but coherent. Mood is depressed and affect is congruent with mood. Thought process is coherent and linear. Thought content is logical. Denies auditory and visual hallucinations. However, patient continues to laugh out loud at times and talk to himself.  Denies suicidal ideations. Denies homicidal ideations.  Continue home medications -haldol 10 mg BID for schizophrenia -depakote DR 500 mg BID for mood stability -amantadine 100 mg BID for aggressive behavior -benztropine 1 mg BID for EPS prophylaxis  Patient has been drowsy today, per nursing patient also slept through the night. Will decrease olanzapine to 10 mg QHS.  Disposition: Recommend psychiatric Inpatient admission when medically cleared.   Rozetta Nunnery, NP 04/12/2022, 4:08 PM

## 2022-04-12 NOTE — ED Notes (Signed)
Patient is alert this shift but has been sleeping and drowsy. Patient slow to respond. Patient medication compliant.  Patient ate breakfast.  Patient lying down at this time.

## 2022-04-12 NOTE — ED Provider Notes (Signed)
Emergency Medicine Observation Re-evaluation Note  Brandon [name] is a 31 y.o. male, seen on rounds today.  Pt initially presented to the ED for complaints of Aggressive Behavior Currently, the patient is sleeping.  Physical Exam  BP 139/86 (BP Location: Right Arm)   Pulse 75   Temp (!) 97.5 F (36.4 C) (Oral)   Resp 20   SpO2 98%  Physical Exam General: No acute distress Cardiac: Well-perfused Lungs: Nonlabored  ED Course / MDM  EKG:   I have reviewed the labs performed to date as well as medications administered while in observation.  Recent changes in the last 24 hours include psychiatric assessment.  Plan  Current plan is for medication stabilization and inpatient psychiatric treatment.    Hayden Rasmussen, MD 04/12/22 1705

## 2022-04-13 DIAGNOSIS — F201 Disorganized schizophrenia: Secondary | ICD-10-CM | POA: Diagnosis not present

## 2022-04-13 MED ORDER — DOCUSATE SODIUM 100 MG PO CAPS
100.0000 mg | ORAL_CAPSULE | Freq: Two times a day (BID) | ORAL | Status: DC
  Administered 2022-04-13 – 2022-04-14 (×3): 100 mg via ORAL
  Filled 2022-04-13 (×3): qty 1

## 2022-04-13 MED ORDER — AMLODIPINE BESYLATE 5 MG PO TABS
5.0000 mg | ORAL_TABLET | Freq: Every day | ORAL | Status: DC
  Administered 2022-04-13 – 2022-04-14 (×2): 5 mg via ORAL
  Filled 2022-04-13 (×2): qty 1

## 2022-04-13 MED ORDER — HYDROXYZINE HCL 25 MG PO TABS: 25.0000 mg | ORAL_TABLET | Freq: Three times a day (TID) | ORAL | Status: DC

## 2022-04-13 MED ORDER — METFORMIN HCL ER 500 MG PO TB24
500.0000 mg | ORAL_TABLET | Freq: Two times a day (BID) | ORAL | Status: DC
  Administered 2022-04-13 – 2022-04-14 (×2): 500 mg via ORAL
  Filled 2022-04-13 (×2): qty 1

## 2022-04-13 MED ORDER — HYDROXYZINE HCL 25 MG PO TABS
25.0000 mg | ORAL_TABLET | Freq: Three times a day (TID) | ORAL | Status: DC | PRN
  Administered 2022-04-13: 25 mg via ORAL
  Filled 2022-04-13: qty 1
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
## 2022-04-13 NOTE — ED Provider Notes (Signed)
Emergency Medicine Observation Re-evaluation Note  NILE PRISK is a 31 y.o. male, seen on rounds today.  Pt initially presented to the ED for complaints of Aggressive Behavior Currently, the patient is sleeping.  Physical Exam  BP 128/72 (BP Location: Left Arm)   Pulse 80   Temp 97.8 F (36.6 C) (Oral)   Resp 18   SpO2 98%  Physical Exam General: No acute distress Cardiac: Well-perfused Lungs: Nonlabored Psych: Unable to assess  ED Course / MDM  EKG:   I have reviewed the labs performed to date as well as medications administered while in observation.  Recent changes in the last 24 hours include medications for psychiatric stabilization.  Plan  Current plan is for inpatient psychiatric admission.    Hayden Rasmussen, MD 04/13/22 [phone]

## 2022-04-13 NOTE — Progress Notes (Signed)
Per [facility] admissions, pt has been accepted to [facility] main campus. Accepting provider is Dr. Jonelle Sports. Patient can arrive anytime. Number for report is [phone]. Please fax IVC paperwork to [phone]. Acceptance pending IVC.    Glennie Isle, MSW, LCSW-A Phone: [phone] Disposition/TOC

## 2022-04-13 NOTE — ED Notes (Signed)
Report given to Mariann Laster, Therapist, sports at [facility].  Unable to send pt until IVC paperwork had been served. Attempted x 3 to contact [place] however reached voicemail each time. Mariann Laster, RN is aware pt may not be transported today d/t waiting on IVC paperwork.

## 2022-04-13 NOTE — Progress Notes (Signed)
CSW spoke with Mariann Laster with [facility]. It was reported that this patient is under review and she will reach out to the patient's nurse for further review.  Glennie Isle, MSW, [place] Compton Phone: [phone] Disposition/TOC

## 2022-04-13 NOTE — ED Notes (Signed)
[facility] contacted this Probation officer to inform they would not be able to transport pt to [facility] until tomorrow. Please call in the morning to have pt placed on list for transport. [facility] notified that the pt would not be arriving until tomorrow.

## 2022-04-14 DIAGNOSIS — F201 Disorganized schizophrenia: Secondary | ICD-10-CM | POA: Diagnosis not present

## 2022-04-14 NOTE — ED Notes (Signed)
[facility] transportation office called. I left a message with the details, and a call back number.

## 2022-04-14 NOTE — ED Notes (Signed)
Patient was calm and cooperative throughout the night. He took his meds and went to sleep.

## 2022-04-14 NOTE — ED Provider Notes (Signed)
Emergency Medicine Observation Re-evaluation Note  MAURY GRONINGER is a 31 y.o. male, seen on rounds today.  Pt initially presented to the ED for complaints of Aggressive Behavior Currently, the patient is asleep.  Physical Exam  BP 123/69 (BP Location: Right Arm)   Pulse 70   Temp 98.1 F (36.7 C) (Oral)   Resp 18   SpO2 98%  Physical Exam General: sleeping Cardiac: deferred, asleep Lungs: deferred, asleep Psych: deferred, asleep  ED Course / MDM  EKG:   I have reviewed the labs performed to date as well as medications administered while in observation.  Recent changes in the last 24 hours include home meds being ordered.  Plan  Current plan is for transfer to [facility]. Dr Selinda Flavin is accepting.    Sherwood Gambler, MD 04/14/22 [phone]

## 2022-04-28 ENCOUNTER — Ambulatory Visit: Payer: Medicare Other | Admitting: Neurology

## 2022-05-20 ENCOUNTER — Other Ambulatory Visit (HOSPITAL_COMMUNITY): Payer: Self-pay

## 2022-05-23 ENCOUNTER — Other Ambulatory Visit (HOSPITAL_COMMUNITY): Payer: Self-pay

## 2022-05-26 ENCOUNTER — Other Ambulatory Visit (HOSPITAL_COMMUNITY): Payer: Self-pay

## 2022-06-08 ENCOUNTER — Ambulatory Visit (HOSPITAL_COMMUNITY)
Admission: EM | Admit: 2022-06-08 | Discharge: 2022-06-08 | Disposition: A | Discharge status: Home / Self Care | Payer: Medicare Other

## 2022-06-08 DIAGNOSIS — Z638 Other specified problems related to primary support group: Secondary | ICD-10-CM

## 2022-06-08 DIAGNOSIS — R4689 Other symptoms and signs involving appearance and behavior: Secondary | ICD-10-CM

## 2022-06-08 NOTE — ED Notes (Signed)
Pt was evaluated and discharged by provider.   Pt left in cab.
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
## 2022-06-08 NOTE — Discharge Instructions (Signed)
Parent/guardian of Brandon [name] and [name] were instructed on how to take medications as prescribed; and to report adverse effects to outpatient provider.  AUTHER LYERLY is to follow up with primary doctor for any medical issues and if symptoms recur report to nearest emergency or crisis hot line.

## 2022-06-23 ENCOUNTER — Other Ambulatory Visit: Payer: Self-pay

## 2022-06-23 ENCOUNTER — Emergency Department (HOSPITAL_COMMUNITY)
Admission: EM | Admit: 2022-06-23 | Discharge: 2022-06-24 | Disposition: A | Discharge status: Home / Self Care | Payer: Medicare Other | Attending: Emergency Medicine | Admitting: Emergency Medicine

## 2022-06-23 ENCOUNTER — Emergency Department (HOSPITAL_COMMUNITY): Payer: Medicare Other

## 2022-06-23 ENCOUNTER — Encounter (HOSPITAL_COMMUNITY): Payer: Self-pay | Admitting: *Deleted

## 2022-06-23 DIAGNOSIS — R4689 Other symptoms and signs involving appearance and behavior: Secondary | ICD-10-CM | POA: Diagnosis not present

## 2022-06-23 DIAGNOSIS — F79 Unspecified intellectual disabilities: Secondary | ICD-10-CM | POA: Insufficient documentation

## 2022-06-23 DIAGNOSIS — S01112A Laceration without foreign body of left eyelid and periocular area, initial encounter: Secondary | ICD-10-CM | POA: Insufficient documentation

## 2022-06-23 DIAGNOSIS — S0990XA Unspecified injury of head, initial encounter: Secondary | ICD-10-CM | POA: Diagnosis present

## 2022-06-23 DIAGNOSIS — I10 Essential (primary) hypertension: Secondary | ICD-10-CM | POA: Insufficient documentation

## 2022-06-23 DIAGNOSIS — Z79899 Other long term (current) drug therapy: Secondary | ICD-10-CM | POA: Diagnosis not present

## 2022-06-23 DIAGNOSIS — Z23 Encounter for immunization: Secondary | ICD-10-CM | POA: Diagnosis not present

## 2022-06-23 DIAGNOSIS — Z7901 Long term (current) use of anticoagulants: Secondary | ICD-10-CM | POA: Insufficient documentation

## 2022-06-23 DIAGNOSIS — S0101XA Laceration without foreign body of scalp, initial encounter: Secondary | ICD-10-CM | POA: Insufficient documentation

## 2022-06-23 MED ORDER — LIDOCAINE-EPINEPHRINE-TETRACAINE (LET) TOPICAL GEL
3.0000 mL | Freq: Once | TOPICAL | Status: AC
  Administered 2022-06-24: 3 mL via TOPICAL
  Filled 2022-06-23: qty 3

## 2022-06-23 MED ORDER — TETANUS-DIPHTH-ACELL PERTUSSIS 5-2.5-18.5 LF-MCG/0.5 IM SUSY
0.5000 mL | PREFILLED_SYRINGE | Freq: Once | INTRAMUSCULAR | Status: AC
  Administered 2022-06-24: 0.5 mL via INTRAMUSCULAR
  Filled 2022-06-23: qty 0.5
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
## 2022-06-24 ENCOUNTER — Emergency Department (HOSPITAL_COMMUNITY): Payer: Medicare Other

## 2022-06-24 DIAGNOSIS — S0101XA Laceration without foreign body of scalp, initial encounter: Secondary | ICD-10-CM | POA: Diagnosis not present

## 2022-06-24 DIAGNOSIS — R4689 Other symptoms and signs involving appearance and behavior: Secondary | ICD-10-CM

## 2022-06-24 LAB — RAPID URINE DRUG SCREEN, HOSP PERFORMED
Amphetamines: NOT DETECTED
Barbiturates: NOT DETECTED
Benzodiazepines: NOT DETECTED
Cocaine: NOT DETECTED
Opiates: NOT DETECTED
Tetrahydrocannabinol: NOT DETECTED

## 2022-06-24 LAB — COMPREHENSIVE METABOLIC PANEL
ALT: 30 U/L (ref 0–44)
AST: 16 U/L (ref 15–41)
Albumin: 3.8 g/dL (ref 3.5–5.0)
Alkaline Phosphatase: 60 U/L (ref 38–126)
Anion gap: 13 (ref 5–15)
BUN: 14 mg/dL (ref 6–20)
CO2: 21 mmol/L — ABNORMAL LOW (ref 22–32)
Calcium: 9.7 mg/dL (ref 8.9–10.3)
Chloride: 107 mmol/L (ref 98–111)
Creatinine, Ser: 1.27 mg/dL — ABNORMAL HIGH (ref 0.61–1.24)
GFR, Estimated: >60 mL/min (ref >60)
Glucose, Bld: 125 mg/dL — ABNORMAL HIGH (ref 70–99)
Potassium: 3.6 mmol/L (ref 3.5–5.1)
Sodium: 141 mmol/L (ref 135–145)
Total Bilirubin: 0.5 mg/dL (ref 0.3–1.2)
Total Protein: 6.1 g/dL — ABNORMAL LOW (ref 6.5–8.1)

## 2022-06-24 LAB — CBC
HCT: 46.7 % (ref 39.0–52.0)
Hemoglobin: 14.3 g/dL (ref 13.0–17.0)
MCH: 25.9 pg — ABNORMAL LOW (ref 26.0–34.0)
MCHC: 30.6 g/dL (ref 30.0–36.0)
MCV: 84.6 fL (ref 80.0–100.0)
Platelets: 172 10*3/uL (ref 150–400)
RBC: 5.52 MIL/uL (ref 4.22–5.81)
RDW: 15.5 % (ref 11.5–15.5)
WBC: 6.9 10*3/uL (ref 4.0–10.5)
nRBC: 0 % (ref 0.0–0.2)

## 2022-06-24 LAB — VALPROIC ACID LEVEL: Valproic Acid Lvl: 37 ug/mL — ABNORMAL LOW (ref 50.0–100.0)

## 2022-06-24 MED ORDER — HALOPERIDOL 5 MG PO TABS
10.0000 mg | ORAL_TABLET | Freq: Two times a day (BID) | ORAL | Status: DC
  Administered 2022-06-24 (×2): 10 mg via ORAL
  Filled 2022-06-24 (×2): qty 2

## 2022-06-24 MED ORDER — APIXABAN 5 MG PO TABS
5.0000 mg | ORAL_TABLET | Freq: Two times a day (BID) | ORAL | Status: DC
  Administered 2022-06-24: 5 mg via ORAL
  Filled 2022-06-24: qty 1

## 2022-06-24 MED ORDER — HYDROXYZINE HCL 25 MG PO TABS
25.0000 mg | ORAL_TABLET | Freq: Three times a day (TID) | ORAL | Status: DC
  Administered 2022-06-24: 25 mg via ORAL
  Filled 2022-06-24: qty 1

## 2022-06-24 MED ORDER — LORAZEPAM 0.5 MG PO TABS: 0.5000 mg | ORAL_TABLET | Freq: Two times a day (BID) | ORAL | Status: DC | PRN

## 2022-06-24 MED ORDER — TOPIRAMATE 25 MG PO TABS
25.0000 mg | ORAL_TABLET | Freq: Two times a day (BID) | ORAL | Status: DC
  Administered 2022-06-24: 25 mg via ORAL
  Filled 2022-06-24 (×2): qty 1

## 2022-06-24 MED ORDER — DOCUSATE SODIUM 100 MG PO CAPS
100.0000 mg | ORAL_CAPSULE | Freq: Two times a day (BID) | ORAL | Status: DC
  Administered 2022-06-24 (×2): 100 mg via ORAL
  Filled 2022-06-24 (×2): qty 1

## 2022-06-24 MED ORDER — ACETAMINOPHEN 325 MG PO TABS: 650.0000 mg | ORAL_TABLET | ORAL | Status: DC | PRN

## 2022-06-24 MED ORDER — LOSARTAN POTASSIUM 50 MG PO TABS
100.0000 mg | ORAL_TABLET | Freq: Every day | ORAL | Status: DC
  Administered 2022-06-24: 100 mg via ORAL
  Filled 2022-06-24: qty 2

## 2022-06-24 MED ORDER — BENZTROPINE MESYLATE 1 MG PO TABS
1.0000 mg | ORAL_TABLET | Freq: Two times a day (BID) | ORAL | Status: DC
  Filled 2022-06-24 (×3): qty 1

## 2022-06-24 MED ORDER — AMLODIPINE BESYLATE 5 MG PO TABS
5.0000 mg | ORAL_TABLET | Freq: Every day | ORAL | Status: DC
  Administered 2022-06-24: 5 mg via ORAL
  Filled 2022-06-24: qty 1

## 2022-06-24 MED ORDER — DIVALPROEX SODIUM 500 MG PO DR TAB
500.0000 mg | DELAYED_RELEASE_TABLET | Freq: Two times a day (BID) | ORAL | Status: DC
  Administered 2022-06-24: 500 mg via ORAL
  Filled 2022-06-24: qty 2
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
## 2022-06-24 NOTE — ED Notes (Signed)
Pt in hall responding to internal stimuli but easily redirectable. Pt cooperative with staff at this time.

## 2022-06-24 NOTE — Discharge Instructions (Signed)

## 2022-07-11 ENCOUNTER — Other Ambulatory Visit: Payer: Self-pay

## 2022-07-11 ENCOUNTER — Emergency Department (HOSPITAL_COMMUNITY)
Admission: EM | Admit: 2022-07-11 | Discharge: 2022-07-12 | Disposition: A | Discharge status: Home / Self Care | Payer: Medicare Other | Attending: Emergency Medicine | Admitting: Emergency Medicine

## 2022-07-11 DIAGNOSIS — Z7901 Long term (current) use of anticoagulants: Secondary | ICD-10-CM | POA: Insufficient documentation

## 2022-07-11 DIAGNOSIS — I10 Essential (primary) hypertension: Secondary | ICD-10-CM | POA: Diagnosis not present

## 2022-07-11 DIAGNOSIS — Z79899 Other long term (current) drug therapy: Secondary | ICD-10-CM | POA: Insufficient documentation

## 2022-07-11 DIAGNOSIS — R440 Auditory hallucinations: Secondary | ICD-10-CM | POA: Diagnosis not present

## 2022-07-11 DIAGNOSIS — F29 Unspecified psychosis not due to a substance or known physiological condition: Secondary | ICD-10-CM | POA: Diagnosis not present

## 2022-07-11 DIAGNOSIS — Z1152 Encounter for screening for COVID-19: Secondary | ICD-10-CM | POA: Diagnosis not present

## 2022-07-11 DIAGNOSIS — F201 Disorganized schizophrenia: Secondary | ICD-10-CM | POA: Diagnosis present

## 2022-07-11 LAB — CBC WITH DIFFERENTIAL/PLATELET
Abs Immature Granulocytes: 0.02 10*3/uL (ref 0.00–0.07)
Basophils Absolute: 0.1 10*3/uL (ref 0.0–0.1)
Basophils Relative: 1 %
Eosinophils Absolute: 0.1 10*3/uL (ref 0.0–0.5)
Eosinophils Relative: 2 %
HCT: 41.5 % (ref 39.0–52.0)
Hemoglobin: 12.9 g/dL — ABNORMAL LOW (ref 13.0–17.0)
Immature Granulocytes: 0 %
Lymphocytes Relative: 33 %
Lymphs Abs: 2.5 10*3/uL (ref 0.7–4.0)
MCH: 25.3 pg — ABNORMAL LOW (ref 26.0–34.0)
MCHC: 31.1 g/dL (ref 30.0–36.0)
MCV: 81.4 fL (ref 80.0–100.0)
Monocytes Absolute: 0.6 10*3/uL (ref 0.1–1.0)
Monocytes Relative: 8 %
Neutro Abs: 4.4 10*3/uL (ref 1.7–7.7)
Neutrophils Relative %: 56 %
Platelets: 191 10*3/uL (ref 150–400)
RBC: 5.1 MIL/uL (ref 4.22–5.81)
RDW: 14.5 % (ref 11.5–15.5)
WBC: 7.6 10*3/uL (ref 4.0–10.5)
nRBC: 0 % (ref 0.0–0.2)

## 2022-07-11 LAB — RAPID URINE DRUG SCREEN, HOSP PERFORMED
Amphetamines: NOT DETECTED
Barbiturates: NOT DETECTED
Benzodiazepines: NOT DETECTED
Cocaine: NOT DETECTED
Opiates: NOT DETECTED
Tetrahydrocannabinol: NOT DETECTED

## 2022-07-11 LAB — COMPREHENSIVE METABOLIC PANEL
ALT: 43 U/L (ref 0–44)
AST: 21 U/L (ref 15–41)
Albumin: 4.1 g/dL (ref 3.5–5.0)
Alkaline Phosphatase: 67 U/L (ref 38–126)
Anion gap: 13 (ref 5–15)
BUN: 12 mg/dL (ref 6–20)
CO2: 24 mmol/L (ref 22–32)
Calcium: 10 mg/dL (ref 8.9–10.3)
Chloride: 105 mmol/L (ref 98–111)
Creatinine, Ser: 0.99 mg/dL (ref 0.61–1.24)
GFR, Estimated: >60 mL/min (ref >60)
Glucose, Bld: 138 mg/dL — ABNORMAL HIGH (ref 70–99)
Potassium: 3.5 mmol/L (ref 3.5–5.1)
Sodium: 142 mmol/L (ref 135–145)
Total Bilirubin: 0.4 mg/dL (ref 0.3–1.2)
Total Protein: 6.5 g/dL (ref 6.5–8.1)

## 2022-07-11 LAB — RESP PANEL BY RT-PCR (RSV, FLU A&B, COVID)  RVPGX2
Influenza A by PCR: NEGATIVE
Influenza B by PCR: NEGATIVE
Resp Syncytial Virus by PCR: NEGATIVE
SARS Coronavirus 2 by RT PCR: NEGATIVE

## 2022-07-11 LAB — ETHANOL: Alcohol, Ethyl (B): <10 mg/dL (ref <10)

## 2022-07-11 LAB — VALPROIC ACID LEVEL: Valproic Acid Lvl: <10 ug/mL — ABNORMAL LOW (ref 50.0–100.0)

## 2022-07-11 LAB — SALICYLATE LEVEL: Salicylate Lvl: <7 mg/dL — ABNORMAL LOW (ref 7.0–30.0)

## 2022-07-11 LAB — ACETAMINOPHEN LEVEL: Acetaminophen (Tylenol), Serum: <10 ug/mL — ABNORMAL LOW (ref 10–30)

## 2022-07-11 MED ORDER — STERILE WATER FOR INJECTION IJ SOLN
INTRAMUSCULAR | Status: AC
  Administered 2022-07-11: 1.2 mL
  Filled 2022-07-11: qty 10

## 2022-07-11 MED ORDER — BENZTROPINE MESYLATE 1 MG PO TABS
1.0000 mg | ORAL_TABLET | Freq: Two times a day (BID) | ORAL | Status: DC
  Administered 2022-07-11 – 2022-07-12 (×3): 1 mg via ORAL
  Filled 2022-07-11 (×4): qty 1

## 2022-07-11 MED ORDER — LOSARTAN POTASSIUM 50 MG PO TABS
100.0000 mg | ORAL_TABLET | Freq: Every day | ORAL | Status: DC
  Administered 2022-07-11 – 2022-07-12 (×2): 100 mg via ORAL
  Filled 2022-07-11 (×2): qty 2

## 2022-07-11 MED ORDER — APIXABAN 5 MG PO TABS
5.0000 mg | ORAL_TABLET | Freq: Two times a day (BID) | ORAL | Status: DC
  Administered 2022-07-11 – 2022-07-12 (×3): 5 mg via ORAL
  Filled 2022-07-11 (×3): qty 1

## 2022-07-11 MED ORDER — ZIPRASIDONE MESYLATE 20 MG IM SOLR
20.0000 mg | Freq: Once | INTRAMUSCULAR | Status: AC
  Administered 2022-07-11: 20 mg via INTRAMUSCULAR
  Filled 2022-07-11: qty 20

## 2022-07-11 MED ORDER — HALOPERIDOL 5 MG PO TABS
10.0000 mg | ORAL_TABLET | Freq: Every day | ORAL | Status: DC | PRN
  Administered 2022-07-11 – 2022-07-12 (×2): 10 mg via ORAL
  Filled 2022-07-11 (×2): qty 2

## 2022-07-11 MED ORDER — PALIPERIDONE ER 6 MG PO TB24
9.0000 mg | ORAL_TABLET | Freq: Every morning | ORAL | Status: DC
  Administered 2022-07-11 – 2022-07-12 (×2): 9 mg via ORAL
  Filled 2022-07-11 (×2): qty 1

## 2022-07-11 MED ORDER — HYDROXYZINE HCL 25 MG PO TABS
25.0000 mg | ORAL_TABLET | Freq: Three times a day (TID) | ORAL | Status: DC
  Administered 2022-07-11 – 2022-07-12 (×3): 25 mg via ORAL
  Filled 2022-07-11 (×3): qty 1

## 2022-07-11 MED ORDER — NICOTINE 14 MG/24HR TD PT24
14.0000 mg | MEDICATED_PATCH | Freq: Every day | TRANSDERMAL | Status: DC
  Administered 2022-07-11 – 2022-07-12 (×2): 14 mg via TRANSDERMAL
  Filled 2022-07-11 (×2): qty 1

## 2022-07-11 MED ORDER — AMANTADINE HCL 100 MG PO CAPS
100.0000 mg | ORAL_CAPSULE | Freq: Two times a day (BID) | ORAL | Status: DC
  Administered 2022-07-11 – 2022-07-12 (×3): 100 mg via ORAL
  Filled 2022-07-11 (×4): qty 1

## 2022-07-11 MED ORDER — OLANZAPINE 10 MG PO TABS
15.0000 mg | ORAL_TABLET | Freq: Every day | ORAL | Status: DC
  Administered 2022-07-11: 15 mg via ORAL
  Filled 2022-07-11: qty 2

## 2022-07-11 MED ORDER — DIVALPROEX SODIUM 250 MG PO DR TAB
500.0000 mg | DELAYED_RELEASE_TABLET | Freq: Two times a day (BID) | ORAL | Status: DC
  Administered 2022-07-11 – 2022-07-12 (×3): 500 mg via ORAL
  Filled 2022-07-11 (×3): qty 2

## 2022-07-11 MED ORDER — AMLODIPINE BESYLATE 5 MG PO TABS
5.0000 mg | ORAL_TABLET | Freq: Every day | ORAL | Status: DC
  Administered 2022-07-11 – 2022-07-12 (×2): 5 mg via ORAL
  Filled 2022-07-11 (×2): qty 1

## 2022-07-11 NOTE — Consult Note (Signed)
Attempted to see this patient for psych assessment via tts cart. Was informed by nurse Lorenza Chick that patient is in the bathroom and is unavailable. She will alert this Probation officer when he's available.

## 2022-07-11 NOTE — ED Triage Notes (Signed)
Patient reports auditory hallucinations today , denies SI or HI .

## 2022-07-11 NOTE — Consult Note (Signed)
Called the tts cart in an attempt to see patient for psychiatric assessment.  Answered by Lorenza Chick, RN  who states patient remains in the bathroom and has been there a long time.  Requested she alert this Probation officer when patient is available for assessment.

## 2022-07-11 NOTE — Consult Note (Signed)
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 male patient admitted with audible hallucinations.  HPI:  Patient seen via telepsych by this provider; chart reviewed and consulted with Dr. Dwyane Dee on 07/11/22.  On evaluation Brandon [name] mumbles, is difficult to understand but when asked if anything is bothering him he states, "my heart" and places his hand on the left side of his chest.  States he has not had his mental health medications because his heart was "racing"  Patient is seen staring looking around the room and starting into space; appears he's responding to internal stimulus, participation is limited.   Labs: UDS is negative, WBC- no leucocytosis; Blood sugar elevated at 138mg /dl but pt was not fasting. Infectious disease panel is negative.    During evaluation Brandon [name] is in exam room seated on chair; he is alert to name only; minimally responsive to questions, appears limited as he's responding to internal stimulus.  He mood is dysphoric with flat affect.  Patient speech is blocked.  His thought process is irrelevant; He appears to be responding to internal/external stimuli.  Unable to assess SI/HI at this time.     Per ED Provider Admission Assessment: Chief Complaint  Patient presents with   Auditory Hallucinations       Brandon [name] is a 32 y.o. male. With past medical history of bipolar disorder, ADHD, schizophrenia, HTN, PE, MS who presents to the emergency department with auditory hallucinations.   Level 5 Caveat:  hallucinations   Patient is minimally able to participate in interview. He does tell me it is January and we are at [facility]. He then talks about having visions and laughs inappropriately during the interview. He mumbles and is at times difficulty to understand. He will then stare off into space and not answer questions.   Past Psychiatric History: as outlined below  Risk to Self:  yes Risk to Others:  yes Prior Inpatient Therapy: yes  Prior Outpatient Therapy:  unsure  Past Medical History:  Past Medical History:  Diagnosis Date   ADHD (attention deficit hyperactivity disorder)    Bipolar 1 disorder (HCC)    Chronic back pain    Chronic constipation    Chronic neck pain    Hypertension    Multiple sclerosis ([place]) 05/20/2013   left sided weakness, dysarthria   Non-compliance    Obesity    Pulmonary embolism ([place])    Schizophrenia ([place])    Stroke ([place])    left sided deficits - pt's mother denies this    Past Surgical History:  Procedure Laterality Date   NO PAST SURGERIES     None     RADIOLOGY WITH ANESTHESIA N/A 01/16/2021   Procedure: MRI WITH ANESTHESIA CERVICAL AND THORASIC SPINE WITH AND WITHOUT CONTRAST;  Surgeon: Radiologist, Medication, MD;  Location: [place];  Service: Radiology;  Laterality: N/A;   TOOTH EXTRACTION N/A 06/24/2019   Procedure: DENTAL RESTORATION/EXTRACTION OF TEETH NUMBER ONE, SIXTEEN, SEVENTEEN, NINETEEN, THIRTY-TWO;  Surgeon: Diona Browner, DDS;  Location: [place];  Service: Oral Surgery;  Laterality: N/A;  Family History:  Family History  Problem Relation Age of Onset   Diabetes Mother    ADD / ADHD Brother    Family Psychiatric  History: deferred Social History:  Social History   Substance and Sexual Activity  Alcohol Use Not Currently   Alcohol/week: 0.0 standard drinks of alcohol   Comment: "A little bit"      Social History   Substance and Sexual Activity  Drug Use Not Currently   Types: Marijuana   Comment: Last used: unknown      Social History   Socioeconomic History   Marital status: Single    Spouse name: Not on file   Number of children: 0   Years of education: 11th   Highest education level: Not on file  Occupational History   Occupation: unemployed    Employer: [employer]    Comment: Disbaled  Tobacco Use   Smoking status: Every Day    Packs/day: 0.25    Types: Cigarettes   Smokeless tobacco: Never   Tobacco comments:    2 cigarettes a day  Vaping Use   Vaping Use: Never used  Substance and Sexual Activity   Alcohol use: Not Currently    Alcohol/week: 0.0 standard drinks of alcohol    Comment: "A little bit"    Drug use: Not Currently    Types: Marijuana    Comment: Last used: unknown    Sexual activity: Not on file  Other Topics Concern   Not on file  Social History Narrative   Patient lives at home with his mother.   Disabled.   Education 11 th grade .   Right handed.   Drinks caffeine occassionally   Social Determinants of Radio broadcast assistant Strain: Not on file  Food Insecurity: Not on file  Transportation Needs: Not on file  Physical Activity: Not on file  Stress: Not on file  Social Connections: Not on file   Additional Social History:    Allergies:  No Known Allergies  Labs:  Results for orders placed or performed during the hospital encounter of 07/11/22 (from the past 48 hour(s))  Valproic acid level     Status: Abnormal   Collection Time: 07/11/22 12:20 AM  Result Value Ref Range   Valproic Acid Lvl <10 (L) 50.0 - 100.0 ug/mL    Comment: RESULT CONFIRMED BY MANUAL DILUTION Performed at [facility], [address] [place], [place] [address]   CBC with Differential     Status: Abnormal   Collection Time: 07/11/22 12:21 AM  Result Value Ref Range   WBC 7.6 4.0 - 10.5 K/uL   RBC 5.10 4.22 - 5.81 MIL/uL   Hemoglobin 12.9 (L) 13.0 - 17.0 g/dL   HCT 41.5 39.0 - 52.0 %   MCV 81.4 80.0 - 100.0 fL   MCH 25.3 (L) 26.0 - 34.0 pg   MCHC 31.1  30.0 - 36.0 g/dL   RDW 14.5 11.5 - 15.5 %   Platelets 191 150 - 400 K/uL   nRBC 0.0 0.0 - 0.2 %   Neutrophils Relative % 56 %   Neutro Abs 4.4 1.7 - 7.7 K/uL   Lymphocytes Relative 33 %   Lymphs Abs 2.5 0.7 - 4.0 K/uL   Monocytes Relative 8 %   Monocytes Absolute 0.6 0.1 - 1.0 K/uL   Eosinophils Relative 2 %   Eosinophils Absolute 0.1 0.0 - 0.5 K/uL   Basophils Relative 1 %   Basophils Absolute 0.1 0.0 -  0.1 K/uL   Immature Granulocytes 0 %   Abs Immature Granulocytes 0.02 0.00 - 0.07 K/uL    Comment: Performed at [facility], [place] [address] [place], [place] [address]  Comprehensive metabolic panel     Status: Abnormal   Collection Time: 07/11/22 12:21 AM  Result Value Ref Range   Sodium 142 135 - 145 mmol/L   Potassium 3.5 3.5 - 5.1 mmol/L   Chloride 105 98 - 111 mmol/L   CO2 24 22 - 32 mmol/L   Glucose, Bld 138 (H) 70 - 99 mg/dL    Comment: Glucose reference range applies only to samples taken after fasting for at least 8 hours.   BUN 12 6 - 20 mg/dL   Creatinine, Ser 0.99 0.61 - 1.24 mg/dL   Calcium 10.0 8.9 - 10.3 mg/dL   Total Protein 6.5 6.5 - 8.1 g/dL   Albumin 4.1 3.5 - 5.0 g/dL   AST 21 15 - 41 U/L   ALT 43 0 - 44 U/L   Alkaline Phosphatase 67 38 - 126 U/L   Total Bilirubin 0.4 0.3 - 1.2 mg/dL   GFR, Estimated >60 >60 mL/min    Comment: (NOTE) Calculated using the CKD-EPI Creatinine Equation (2021)    Anion gap 13 5 - 15    Comment: Performed at [facility] [address] [place], [place] [address]  Ethanol     Status: None   Collection Time: 07/11/22 12:21 AM  Result Value Ref Range   Alcohol, Ethyl (B) <10 <10 mg/dL    Comment: (NOTE) Lowest detectable limit for serum alcohol is 10 mg/dL.  For medical purposes only. Performed at [facility], [place] [address] [place], [place] [address]   Rapid urine drug screen (hospital performed)     Status: None   Collection Time: 07/11/22 12:21 AM  Result Value Ref Range   Opiates NONE  DETECTED NONE DETECTED   Cocaine NONE DETECTED NONE DETECTED   Benzodiazepines NONE DETECTED NONE DETECTED   Amphetamines NONE DETECTED NONE DETECTED   Tetrahydrocannabinol NONE DETECTED NONE DETECTED   Barbiturates NONE DETECTED NONE DETECTED    Comment: (NOTE) DRUG SCREEN FOR MEDICAL PURPOSES ONLY.  IF CONFIRMATION IS NEEDED FOR ANY PURPOSE, NOTIFY LAB WITHIN 5 DAYS.  LOWEST DETECTABLE LIMITS FOR URINE DRUG SCREEN Drug Class                     Cutoff (ng/mL) Amphetamine and metabolites    1000 Barbiturate and metabolites    200 Benzodiazepine                 200 Opiates and metabolites        300 Cocaine and metabolites        300 THC                            50 Performed at [facility], [place] [address], [place], [place] [id number]   Salicylate level     Status: Abnormal   Collection Time: 07/11/22 12:21 AM  Result Value Ref Range   Salicylate Lvl [id number] (L) 7.0 - 30.0 mg/dL    Comment: Performed at [facility] [address] [place], [place] [address]  Acetaminophen level     Status: Abnormal   Collection Time: 07/11/22 12:21 AM  Result Value Ref Range   Acetaminophen (Tylenol), Serum <10 (L) 10 - 30  ug/mL    Comment: (NOTE) Therapeutic concentrations vary significantly. A range of 10-30 ug/mL  may be an effective concentration for many patients. However, some  are best treated at concentrations outside of this range. Acetaminophen concentrations >150 ug/mL at 4 hours after ingestion  and >50 ug/mL at 12 hours after ingestion are often associated with  toxic reactions.  Performed at [facility], [place] [address] [place], [place] [address]   Resp panel by RT-PCR (RSV, Flu A&B, Covid) Anterior Nasal Swab     Status: None   Collection Time: 07/11/22 12:21 AM   Specimen: Anterior Nasal Swab  Result Value Ref Range   SARS Coronavirus 2 by RT PCR NEGATIVE NEGATIVE    Comment: (NOTE) SARS-CoV-2 target nucleic acids are NOT DETECTED.  The SARS-CoV-2  RNA is generally detectable in upper respiratory specimens during the acute phase of infection. The lowest concentration of SARS-CoV-2 viral copies this assay can detect is 138 copies/mL. A negative result does not preclude SARS-Cov-2 infection and should not be used as the sole basis for treatment or other patient management decisions. A negative result may occur with  improper specimen collection/handling, submission of specimen other than nasopharyngeal swab, presence of viral mutation(s) within the areas targeted by this assay, and inadequate number of viral copies(<138 copies/mL). A negative result must be combined with clinical observations, patient history, and epidemiological information. The expected result is Negative.  Fact Sheet for Patients:  EntrepreneurPulse.com.au  Fact Sheet for Healthcare Providers:  IncredibleEmployment.be  This test is no t yet approved or cleared by the [place] FDA and  has been authorized for detection and/or diagnosis of SARS-CoV-2 by FDA under an Emergency Use Authorization (EUA). This EUA will remain  in effect (meaning this test can be used) for the duration of the COVID-19 declaration under Section 564(b)(1) of the Act, 21 U.S.C.section 360bbb-3(b)(1), unless the authorization is terminated  or revoked sooner.       Influenza A by PCR NEGATIVE NEGATIVE   Influenza B by PCR NEGATIVE NEGATIVE    Comment: (NOTE) The Xpert Xpress SARS-CoV-2/FLU/RSV plus assay is intended as an aid in the diagnosis of influenza from Nasopharyngeal swab specimens and should not be used as a sole basis for treatment. Nasal washings and aspirates are unacceptable for Xpert Xpress SARS-CoV-2/FLU/RSV testing.  Fact Sheet for Patients: EntrepreneurPulse.com.au  Fact Sheet for Healthcare Providers: IncredibleEmployment.be  This test is not yet approved or cleared by the [place]  FDA and has been authorized for detection and/or diagnosis of SARS-CoV-2 by FDA under an Emergency Use Authorization (EUA). This EUA will remain in effect (meaning this test can be used) for the duration of the COVID-19 declaration under Section 564(b)(1) of the Act, 21 U.S.C. section 360bbb-3(b)(1), unless the authorization is terminated or revoked.     Resp Syncytial Virus by PCR NEGATIVE NEGATIVE    Comment: (NOTE) Fact Sheet for Patients: EntrepreneurPulse.com.au  Fact Sheet for Healthcare Providers: IncredibleEmployment.be  This test is not yet approved or cleared by the [place] FDA and has been authorized for detection and/or diagnosis of SARS-CoV-2 by FDA under an Emergency Use Authorization (EUA). This EUA will remain in effect (meaning this test can be used) for the duration of the COVID-19 declaration under Section 564(b)(1) of the Act, 21 U.S.C. section 360bbb-3(b)(1), unless the authorization is terminated or revoked.  Performed at [facility], [place] [address], [place], [place] [address]     Medications:  Current Facility-Administered Medications  Medication Dose Route Frequency Provider Last Rate Last Admin   amantadine (SYMMETREL) capsule 100 mg  100 mg Oral BID Mickie Hillier, PA-C   100 mg at 07/11/22 0955   amLODipine (NORVASC) tablet 5 mg  5 mg Oral Daily Mickie Hillier, PA-C   5 mg at 07/11/22 [id number]   apixaban (ELIQUIS) tablet 5 mg  5 mg Oral BID Mickie Hillier, PA-C   5 mg at 07/11/22 [id number]   benztropine (COGENTIN) tablet 1 mg  1 mg Oral BID Mickie Hillier, PA-C   1 mg at 07/11/22 0948   divalproex (DEPAKOTE) DR tablet 500 mg  500 mg Oral Q12H Mickie Hillier, PA-C   500 mg at 07/11/22 [id number]   haloperidol (HALDOL) tablet 10 mg  10 mg Oral Daily PRN Mickie Hillier, PA-C   10 mg at 07/11/22 [id number]   hydrOXYzine (ATARAX) tablet 25 mg  25 mg Oral TID Mickie Hillier, PA-C   25 mg at 07/11/22 [id number]   losartan  (COZAAR) tablet 100 mg  100 mg Oral Daily Mickie Hillier, PA-C   100 mg at 07/11/22 [id number]   nicotine (NICODERM CQ - dosed in mg/24 hours) patch 14 mg  14 mg Transdermal Daily Mickie Hillier, PA-C   14 mg at 07/11/22 0949   OLANZapine (ZYPREXA) tablet 15 mg  15 mg Oral QHS Mickie Hillier, PA-C       paliperidone (INVEGA) 24 hr tablet 9 mg  9 mg Oral q AM Mickie Hillier, PA-C   9 mg at 07/11/22 [id number]   Current Outpatient Medications  Medication Sig Dispense Refill   amantadine (SYMMETREL) 100 MG capsule Take 100 mg by mouth 2 (two) times daily.     amLODipine (NORVASC) 5 MG tablet Take 5 mg by mouth daily.     apixaban (ELIQUIS) 5 MG TABS tablet Take 1 tablet (5 mg total) by mouth 2 (two) times daily. 60 tablet 0   benztropine (COGENTIN) 1 MG tablet Take 1 tablet (1 mg total) by mouth 2 (two) times daily. 60 tablet 0   divalproex (DEPAKOTE) 500 MG DR tablet Take 1 tablet (500 mg total) by mouth every 12 (twelve) hours. 60 tablet 0   docusate sodium (COLACE) 100 MG capsule Take 1 capsule (100 mg total) by mouth 2 (two) times daily. 60 capsule 0   haloperidol (HALDOL) 10 MG tablet Take 1 tablet (10 mg total) by mouth 2 (two) times daily. (Patient taking differently: Take 10 mg by mouth daily as needed (for agitation).) 60 tablet 0   hydrOXYzine (ATARAX) 25 MG tablet Take 25 mg by mouth 3 (three) times daily.     loratadine (CLARITIN) 10 MG tablet Take 1 tablet (10 mg total) by mouth daily as needed for allergies. 30 tablet 0   losartan (COZAAR) 100 MG tablet Take 100 mg by mouth daily.     LUMIGAN 0.01 % SOLN Place 1 drop into both eyes at bedtime. 2.5 mL 0   nicotine (NICODERM CQ - DOSED IN MG/24 HOURS) 14 mg/24hr patch Place 1 patch (14 mg total) onto the skin daily. 28 patch 0   OLANZapine (ZYPREXA) 15 MG tablet Take 1 tablet (15 mg total) by mouth at bedtime. 30 tablet 0   paliperidone (INVEGA) 9 MG 24 hr tablet Take 9 mg by mouth in the morning.     LORazepam (ATIVAN) 0.5 MG tablet Take 0.5  mg by mouth 2 (two) times daily as needed for anxiety or  sedation. (Patient not taking: Reported on 07/11/2022)     losartan (COZAAR) 50 MG tablet Take 1 tablet (50 mg total) by mouth 2 (two) times daily. (Patient not taking: Reported on 07/11/2022) 60 tablet 0   ocrelizumab (OCREVUS) 300 MG/10ML injection Inject 2 vials ( 20 MLs) into the vein every 6 months (Patient not taking: Reported on 07/11/2022) 20 mL 0   temazepam (RESTORIL) 22.5 MG capsule Take 22.5 mg by mouth at bedtime as needed for sleep. (Patient not taking: Reported on 07/11/2022)     topiramate (TOPAMAX) 25 MG tablet TAKE ONE TABLET BY MOUTH TWICE A DAY (Patient not taking: Reported on 07/11/2022) 60 tablet 4    Musculoskeletal: pt moves all extremities and ambulates independently Strength & Muscle Tone: within normal limits Gait & Station: normal Patient leans: N/A   Psychiatric Specialty Exam:  Presentation  General Appearance:  Bizarre; Fairly Groomed  Eye Contact: Minimal  Speech: Garbled  Speech Volume: Decreased  Handedness: Right   Mood and Affect  Mood: Dysphoric  Affect: Flat; Congruent   Thought Process  Thought Processes: Irrevelant  Descriptions of Associations:Tangential  Orientation:Partial  Thought Content:Tangential; Illogical  History of Schizophrenia/Schizoaffective disorder:Yes  Duration of Psychotic Symptoms:Greater than six months  Hallucinations:Hallucinations: Auditory Description of Auditory Hallucinations: pt does not ellaborate  Ideas of Reference:None  Suicidal Thoughts:Suicidal Thoughts: -- (unable to assess)  Homicidal Thoughts:Homicidal Thoughts: -- (unable to assess)   Sensorium  Memory: Immediate Fair  Judgment: Poor  Insight: Lacking   Executive Functions  Concentration: Poor  Attention Span: Poor  Recall: Poor  Fund of Knowledge: Poor  Language: Poor   Psychomotor Activity  Psychomotor Activity:Psychomotor Activity: Normal   Assets   Assets: Desire for Improvement; Financial Resources/Insurance; Housing; Social Support   Sleep  Sleep:Sleep: Fair Number of Hours of Sleep: 6    Physical Exam: Physical Exam Constitutional:      Appearance: He is obese.  Cardiovascular:     Rate and Rhythm: Normal rate.     Pulses: Normal pulses.  Pulmonary:     Effort: Pulmonary effort is normal.  Musculoskeletal:        General: Normal range of motion.     Cervical back: Normal range of motion.  Neurological:     Mental Status: He is alert.  Psychiatric:        Attention and Perception: He is inattentive.        Mood and Affect: Mood is anxious. Affect is flat.        Speech: Speech is delayed.        Behavior: Behavior is slowed.        Cognition and Memory: Cognition is impaired.        Judgment: Judgment is impulsive.    Review of Systems  Constitutional: Negative.   HENT: Negative.    Eyes: Negative.   Respiratory: Negative.    Cardiovascular: Negative.   Gastrointestinal: Negative.   Genitourinary: Negative.   Musculoskeletal: Negative.   Skin: Negative.   Neurological: Negative.   Endo/Heme/Allergies: Negative.    Blood pressure (!) 157/111, pulse 96, temperature 97.6 F (36.4 C), resp. rate 20, SpO2 98 %. There is no height or weight on file to calculate BMI.  Treatment Plan Summary: Patient reviewed and care discussed with Dr. Dwyane Dee.  Patient voluntarily presents to [facility] emergency department with auditory hallucinations.  On assessment today, patient is very limited, mostly mumbles, and stares off into space despite being asked questions.  He appears to be responding  to internal stimulus; patient is mentally decompensated and unable to provide sufficient history. It is unclear if he has been compliant with psychotropic medications. Considering this, he is a safety concern for which overnight observation is recommended.  Plan to restart his psych medications, monitor for safety and mood stability.  He can  be re-evaluated by psychiatry in the AM.  Pending symptomatic improvement, he may be available for discharge to the care of family with continued outpatient psychiatry follow-up.   At his last ED visit, valproic acid levels were subtherapeutic at 37  and today levels show at <10 which suggests medication non-adherence.  Given his history, of frequent mental deterioration, and medication non-compliance, patient may be a good candidate for LAI.  Will defer for now; his home medications were already restarted on admission.    Disposition:  Recommend overnight observation to allow pt to get restarted on his medications; he can be reassessed in the AM.   This service was provided via telemedicine using a 2-way, interactive audio and video technology.  Names of all persons participating in this telemedicine service and their role in this encounter. Name: Mittie Bodo Role: Patient  Name: Merlyn Lot Role: [place]  Name: Hampton Abbot Role: Psychiatrist    Mallie Darting, NP 07/11/2022 6:36 PM

## 2022-07-11 NOTE — ED Provider Triage Note (Signed)
Emergency Medicine Provider Triage Evaluation Note  Brandon [name] , a 32 y.o. male  was evaluated in triage.  Pt complains of hallucinations-- states hearing voices.  Denies SI/HI.  Not very forthcoming with results, mumbling.  Review of Systems  Positive: hallucinations Negative: SI/HI  Physical Exam  BP (!) 163/103 (BP Location: Right Arm)   Pulse 99   Temp 97.7 F (36.5 C) (Oral)   Resp 19   SpO2 100%  Gen:   Awake, no distress   Resp:  Normal effort  MSK:   Moves extremities without difficulty  Other:  Mumbling speech, tangential, difficult to follow, denies SI/HI  Medical Decision Making  Medically screening exam initiated at 12:46 AM.  Appropriate orders placed.  Brandon [name] was informed that the remainder of the evaluation will be completed by another provider, this initial triage assessment does not replace that evaluation, and the importance of remaining in the ED until their evaluation is complete.  Hallucinations.  Denies SI/HI.  Labs ordered.   Larene Pickett, PA-C 07/11/22 [phone]

## 2022-07-11 NOTE — ED Provider Notes (Signed)
[facility] EMERGENCY DEPARTMENT Provider Note   CSN: [id number] Arrival date & time: 07/11/22  0004     History  Chief Complaint  Patient presents with   Auditory Hallucinations     Brandon [name] is a 32 y.o. male. With past medical history of bipolar disorder, ADHD, schizophrenia, HTN, PE, MS who presents to the emergency department with auditory hallucinations.  Level 5 Caveat: hallucinations  Patient is minimally able to participate in interview. He does tell me it is January and we are at [facility]. He then talks about having visions and laughs inappropriately during the interview. He mumbles and is at times difficulty to understand. He will then stare off into space and not answer questions.   HPI     Home Medications Prior to Admission medications   Medication Sig Start Date End Date Taking? Authorizing Provider  amantadine (SYMMETREL) 100 MG capsule Take 100 mg by mouth 2 (two) times daily. 09/25/21  Yes [provider]  amLODipine (NORVASC) 5 MG tablet Take 5 mg by mouth daily. 03/17/22  Yes [provider]  apixaban (ELIQUIS) 5 MG TABS tablet Take 1 tablet (5 mg total) by mouth 2 (two) times daily. 11/01/21  Yes Clapacs, Jackquline Denmark, MD  benztropine (COGENTIN) 1 MG tablet Take 1 tablet (1 mg total) by mouth 2 (two) times daily. 11/01/21  Yes Clapacs, Jackquline Denmark, MD  divalproex (DEPAKOTE) 500 MG DR tablet Take 1 tablet (500 mg total) by mouth every 12 (twelve) hours. 11/01/21  Yes Clapacs, Jackquline Denmark, MD  docusate sodium (COLACE) 100 MG capsule Take 1 capsule (100 mg total) by mouth 2 (two) times daily. 11/01/21  Yes Clapacs, Jackquline Denmark, MD  haloperidol (HALDOL) 10 MG tablet Take 1 tablet (10 mg total) by mouth 2 (two) times daily. Patient taking differently: Take 10 mg by mouth daily as needed (for agitation). 11/01/21  Yes Clapacs, Jackquline Denmark, MD  hydrOXYzine (ATARAX) 25 MG tablet Take 25 mg by mouth 3 (three) times daily.   Yes [provider]   loratadine (CLARITIN) 10 MG tablet Take 1 tablet (10 mg total) by mouth daily as needed for allergies. 11/01/21  Yes Clapacs, Jackquline Denmark, MD  losartan (COZAAR) 100 MG tablet Take 100 mg by mouth daily. 03/17/22  Yes [provider]  LUMIGAN 0.01 % SOLN Place 1 drop into both eyes at bedtime. 11/01/21  Yes Clapacs, Jackquline Denmark, MD  nicotine (NICODERM CQ - DOSED IN MG/24 HOURS) 14 mg/24hr patch Place 1 patch (14 mg total) onto the skin daily. 11/01/21  Yes Clapacs, Jackquline Denmark, MD  OLANZapine (ZYPREXA) 15 MG tablet Take 1 tablet (15 mg total) by mouth at bedtime. 11/01/21  Yes Clapacs, Jackquline Denmark, MD  paliperidone (INVEGA) 9 MG 24 hr tablet Take 9 mg by mouth in the morning.   Yes [provider]  LORazepam (ATIVAN) 0.5 MG tablet Take 0.5 mg by mouth 2 (two) times daily as needed for anxiety or sedation. Patient not taking: Reported on 07/11/2022    [provider]  losartan (COZAAR) 50 MG tablet Take 1 tablet (50 mg total) by mouth 2 (two) times daily. Patient not taking: Reported on 07/11/2022 11/01/21   Clapacs, Jackquline Denmark, MD  ocrelizumab (OCREVUS) 300 MG/10ML injection Inject 2 vials ( 20 MLs) into the vein every 6 months Patient not taking: Reported on 07/11/2022 12/04/21   Butch Penny, NP  temazepam (RESTORIL) 22.5 MG capsule Take 22.5 mg by mouth at bedtime as needed  for sleep. Patient not taking: Reported on 07/11/2022    [provider]  topiramate (TOPAMAX) 25 MG tablet TAKE ONE TABLET BY MOUTH TWICE A DAY Patient not taking: Reported on 07/11/2022 01/23/22   Suzzanne Cloud, NP  gabapentin (NEURONTIN) 400 MG capsule Take 400 mg by mouth 3 (three) times daily. 12/26/20 01/14/21  [provider]  propranolol (INDERAL) 10 MG tablet Take 1 tablet (10 mg total) by mouth 2 (two) times daily. 05/03/15 01/03/20  Patrecia Pour, NP      Allergies    Patient has no known allergies.    Review of Systems   Review of Systems  Unable to perform ROS: Psychiatric disorder    Physical  Exam Updated Vital Signs BP (!) 157/111 (BP Location: Right Arm)   Pulse 96   Temp 97.6 F (36.4 C)   Resp 20   SpO2 98%  Physical Exam Vitals and nursing note reviewed.  Constitutional:      General: He is not in acute distress.    Appearance: He is not ill-appearing.  HENT:     Head: Normocephalic and atraumatic.     Mouth/Throat:     Mouth: Mucous membranes are moist.     Pharynx: Oropharynx is clear.  Eyes:     General: No scleral icterus.    Extraocular Movements: Extraocular movements intact.     Pupils: Pupils are equal, round, and reactive to light.  Cardiovascular:     Rate and Rhythm: Normal rate and regular rhythm.     Pulses: Normal pulses.     Heart sounds: No murmur heard. Pulmonary:     Effort: Pulmonary effort is normal. No respiratory distress.     Breath sounds: Normal breath sounds.  Abdominal:     Palpations: Abdomen is soft.     Tenderness: There is no abdominal tenderness.  Musculoskeletal:     Cervical back: Neck supple.  Skin:    Findings: No rash.  Neurological:     General: No focal deficit present.     Mental Status: He is alert.     Cranial Nerves: Cranial nerves 2-12 are intact.     Motor: Motor function is intact.     Comments: Oriented to self, month and place   Psychiatric:        Attention and Perception: He perceives auditory and visual hallucinations.        Mood and Affect: Affect is labile.        Speech: Speech is tangential.        Behavior: Behavior is slowed. Behavior is cooperative.        Thought Content: Thought content does not include homicidal or suicidal ideation.        Cognition and Memory: Cognition is impaired. He exhibits impaired recent memory.        Judgment: Judgment is impulsive and inappropriate.     ED Results / Procedures / Treatments   Labs (all labs ordered are listed, but only abnormal results are displayed) Labs Reviewed  CBC WITH DIFFERENTIAL/PLATELET - Abnormal; Notable for the following  components:      Result Value   Hemoglobin 12.9 (*)    MCH 25.3 (*)    All other components within normal limits  COMPREHENSIVE METABOLIC PANEL - Abnormal; Notable for the following components:   Glucose, Bld 138 (*)    All other components within normal limits  SALICYLATE LEVEL - Abnormal; Notable for the following components:   Salicylate Lvl <7.7 (*)  All other components within normal limits  ACETAMINOPHEN LEVEL - Abnormal; Notable for the following components:   Acetaminophen (Tylenol), Serum <10 (*)    All other components within normal limits  RESP PANEL BY RT-PCR (RSV, FLU A&B, COVID)  RVPGX2  ETHANOL  RAPID URINE DRUG SCREEN, HOSP PERFORMED    EKG None  Radiology No results found.  Procedures Procedures   Medications Ordered in ED Medications  amantadine (SYMMETREL) capsule 100 mg (100 mg Oral Given 07/11/22 0955)  amLODipine (NORVASC) tablet 5 mg (5 mg Oral Given 07/11/22 0947)  apixaban (ELIQUIS) tablet 5 mg (5 mg Oral Given 07/11/22 0948)  benztropine (COGENTIN) tablet 1 mg (1 mg Oral Given 07/11/22 0948)  divalproex (DEPAKOTE) DR tablet 500 mg (500 mg Oral Given 07/11/22 0948)  hydrOXYzine (ATARAX) tablet 25 mg (25 mg Oral Given 07/11/22 0949)  losartan (COZAAR) tablet 100 mg (100 mg Oral Given 07/11/22 0949)  nicotine (NICODERM CQ - dosed in mg/24 hours) patch 14 mg (14 mg Transdermal Patch Applied 07/11/22 0949)  OLANZapine (ZYPREXA) tablet 15 mg (has no administration in time range)  paliperidone (INVEGA) 24 hr tablet 9 mg (9 mg Oral Given 07/11/22 0955)  haloperidol (HALDOL) tablet 10 mg (10 mg Oral Given 07/11/22 0948)    ED Course/ Medical Decision Making/ A&P Clinical Course as of 07/11/22 1047  Fri Jul 11, 2022  0925 Patient behavior is becoming increasingly agitated. He is screaming continuously. Intermittently laughing. He has gotten out of bed and twice now and wandering the hallways. I have attempted verbal deescalation. I have requested nursing give him his PRN  haldol, atarax.  [LA]    Clinical Course User Index [LA] Mickie Hillier, PA-C                           Medical Decision Making Risk OTC drugs. Prescription drug management.  Initial Impression and Ddx 32 year old male who presents to the emergency department with complaint of hallucinations.  He is tangential on interview and his affect is inappropriate.  He randomly laughs during the exam and then will stare off into space and not answer questions.  Denying suicidal homicidal ideations.  Patient PMH that increases complexity of ED encounter:  schizophrenia, bipolar  Interpretation of Diagnostics I independent reviewed and interpreted the labs as followed: CMP without electrolyte derangement, tox panel negative  - I independently visualized the following imaging with scope of interpretation limited to determining acute life threatening conditions related to emergency care: Not indicated  Patient Reassessment and Ultimate Disposition/Management Patient is actively hallucinating.  He is intermittently wandering the halls and yelling out.  I have reordered his home medications at this time.  He will need evaluation by TTS.   Currently patient is here voluntarily. Will IVC if he attempts to leave.   Medically cleared for psychiatric evaluation. Appreciate recommendations.   Patient management required discussion with the following services or consulting groups:  Psychiatry/TTS  Complexity of Problems Addressed Chronic illness with exacerbation  Additional Data Reviewed and Analyzed Further history obtained from: Past medical history and medications listed in the EMR, Prior ED visit notes, Care Everywhere, and Prior labs/imaging results  Patient Encounter Risk Assessment SDOH impact on management and Consideration of hospitalization  Final Clinical Impression(s) / ED Diagnoses Final diagnoses:  Psychosis, unspecified psychosis type ([place])    Rx / DC Orders ED Discharge Orders      None         Erskine Speed,  Lorriane Shire, PA-C 07/11/22 1047    Linwood Dibbles, MD 07/12/22 [phone]

## 2022-07-12 DIAGNOSIS — F201 Disorganized schizophrenia: Secondary | ICD-10-CM | POA: Diagnosis not present
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
## 2022-07-12 NOTE — ED Provider Notes (Signed)
Pt has been cleared by psychiatry.  Will be tranported directly home by ED provided taxi service.  PT calm and interactive.  Notified of impending discharge.   Malvin Johns, MD 07/12/22 1254

## 2022-07-12 NOTE — ED Notes (Signed)
Safety sitter arrived for pt. Pt intermittently disruptive with outbursts and wandering. Easily redirectable. Pt is sitting with crackers and peanut butter now.

## 2022-07-12 NOTE — Discharge Instructions (Signed)

## 2022-07-13 ENCOUNTER — Encounter (HOSPITAL_COMMUNITY): Payer: Self-pay

## 2022-07-13 ENCOUNTER — Emergency Department (HOSPITAL_COMMUNITY)
Admission: EM | Admit: 2022-07-13 | Discharge: 2022-07-16 | Disposition: A | Discharge status: Home / Self Care | Payer: Medicare Other | Attending: Emergency Medicine | Admitting: Emergency Medicine

## 2022-07-13 DIAGNOSIS — Z7901 Long term (current) use of anticoagulants: Secondary | ICD-10-CM | POA: Insufficient documentation

## 2022-07-13 DIAGNOSIS — F201 Disorganized schizophrenia: Secondary | ICD-10-CM | POA: Insufficient documentation

## 2022-07-13 DIAGNOSIS — Z8659 Personal history of other mental and behavioral disorders: Secondary | ICD-10-CM

## 2022-07-13 DIAGNOSIS — R4689 Other symptoms and signs involving appearance and behavior: Secondary | ICD-10-CM

## 2022-07-13 DIAGNOSIS — Z1152 Encounter for screening for COVID-19: Secondary | ICD-10-CM | POA: Diagnosis not present

## 2022-07-13 DIAGNOSIS — R456 Violent behavior: Secondary | ICD-10-CM | POA: Diagnosis not present

## 2022-07-13 DIAGNOSIS — F639 Impulse disorder, unspecified: Secondary | ICD-10-CM

## 2022-07-13 DIAGNOSIS — F71 Moderate intellectual disabilities: Secondary | ICD-10-CM

## 2022-07-13 MED ORDER — AMLODIPINE BESYLATE 5 MG PO TABS
5.0000 mg | ORAL_TABLET | Freq: Every day | ORAL | Status: DC
  Administered 2022-07-14 – 2022-07-16 (×3): 5 mg via ORAL
  Filled 2022-07-13 (×3): qty 1

## 2022-07-13 MED ORDER — PALIPERIDONE ER 6 MG PO TB24
9.0000 mg | ORAL_TABLET | Freq: Every day | ORAL | Status: DC
  Administered 2022-07-14 – 2022-07-16 (×3): 9 mg via ORAL
  Filled 2022-07-13 (×3): qty 1

## 2022-07-13 MED ORDER — APIXABAN 5 MG PO TABS
5.0000 mg | ORAL_TABLET | Freq: Two times a day (BID) | ORAL | Status: DC
  Administered 2022-07-14 – 2022-07-16 (×6): 5 mg via ORAL
  Filled 2022-07-13 (×7): qty 1

## 2022-07-13 MED ORDER — BENZTROPINE MESYLATE 1 MG PO TABS
1.0000 mg | ORAL_TABLET | Freq: Two times a day (BID) | ORAL | Status: DC
  Administered 2022-07-14 – 2022-07-16 (×6): 1 mg via ORAL
  Filled 2022-07-13: qty 1
  Filled 2022-07-13: qty 2
  Filled 2022-07-13: qty 1
  Filled 2022-07-13: qty 2
  Filled 2022-07-13: qty 1
  Filled 2022-07-13: qty 2

## 2022-07-13 MED ORDER — LOSARTAN POTASSIUM 50 MG PO TABS
100.0000 mg | ORAL_TABLET | Freq: Every day | ORAL | Status: DC
  Administered 2022-07-14 – 2022-07-16 (×3): 100 mg via ORAL
  Filled 2022-07-13: qty 4
  Filled 2022-07-13 (×2): qty 2

## 2022-07-13 MED ORDER — OLANZAPINE 5 MG PO TABS
15.0000 mg | ORAL_TABLET | Freq: Every day | ORAL | Status: DC
  Administered 2022-07-14 – 2022-07-15 (×3): 15 mg via ORAL
  Filled 2022-07-13 (×3): qty 1

## 2022-07-13 MED ORDER — DIVALPROEX SODIUM 500 MG PO DR TAB
500.0000 mg | DELAYED_RELEASE_TABLET | Freq: Two times a day (BID) | ORAL | Status: DC
  Administered 2022-07-14 – 2022-07-16 (×6): 500 mg via ORAL
  Filled 2022-07-13 (×6): qty 1

## 2022-07-13 MED ORDER — AMANTADINE HCL 100 MG PO CAPS
100.0000 mg | ORAL_CAPSULE | Freq: Two times a day (BID) | ORAL | Status: DC
  Administered 2022-07-14 – 2022-07-16 (×6): 100 mg via ORAL
  Filled 2022-07-13 (×6): qty 1

## 2022-07-13 MED ORDER — HYDROXYZINE HCL 25 MG PO TABS
25.0000 mg | ORAL_TABLET | Freq: Three times a day (TID) | ORAL | Status: DC | PRN
  Administered 2022-07-14 – 2022-07-15 (×2): 25 mg via ORAL
  Filled 2022-07-13 (×3): qty 1
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
## 2022-07-13 NOTE — ED Notes (Signed)
Pt changes into scrubs

## 2022-07-14 ENCOUNTER — Other Ambulatory Visit: Payer: Self-pay

## 2022-07-14 DIAGNOSIS — F201 Disorganized schizophrenia: Secondary | ICD-10-CM | POA: Diagnosis not present

## 2022-07-14 LAB — RESP PANEL BY RT-PCR (RSV, FLU A&B, COVID)  RVPGX2
Influenza A by PCR: NEGATIVE
Influenza B by PCR: NEGATIVE
Resp Syncytial Virus by PCR: NEGATIVE
SARS Coronavirus 2 by RT PCR: NEGATIVE

## 2022-07-14 MED ORDER — LORAZEPAM 2 MG/ML IJ SOLN: 2.0000 mg | Freq: Once | INTRAMUSCULAR | Status: DC

## 2022-07-14 NOTE — ED Notes (Signed)
Patient sitting quietly on side of bed eating a meal tray.

## 2022-07-14 NOTE — Progress Notes (Signed)
LCSW Progress Note  [id number]   Brandon [name]  07/14/2022  9:32 AM  Description:   Inpatient Psychiatric Referral  Patient was recommended inpatient per Charmaine Downs, NP. There are no available beds at [facility]. Patient was referred to the following facilities:   Destination  Service Provider Address Phone Fax  [address] [place] [address] [phone]  [facility]  [address] [place] [address] [phone]  [facility]  [place], [place] [address] [phone]  [facility]  [place], [place] [address] [place]  [facility]  [address] [place] [address] [phone]  [place]  [place], [place] [address] [phone]  [facility]  [address] [place], [place] [address] [phone] [facility]  [place] [place] [address] [place]  [facility]  [address] [place] [address] [phone]  [facility]  [address], [place] [address] [phone]  [facility]  [address], [place] [address] [phone] [place]  [address], [place] [address] [phone]  [place]  [address], [place] [address] [phone] [facility]  [address] [place] [address] [phone]  [facility]  [address], [place] [address] [phone]  [facility]  [address] [place] [address] [place]   [facility]  [address], [place] [address] [phone]  [place]  [address], [place] [address] [phone]  [facility]  [address] [place] [address] [phone] [place]  [place] [place] [address] [phone]      Situation ongoing, CSW to continue following and update chart as more information becomes available.      Denna Haggard, Latanya Presser  07/14/2022 9:32 AM

## 2022-07-14 NOTE — ED Provider Notes (Signed)
Emergency Medicine Observation Re-evaluation Note  Brandon [name] is a 32 y.o. male, seen on rounds today.  Pt initially presented to the ED for complaints of Aggressive Behavior Currently, the patient is resting comfortably.  Physical Exam  BP 126/76   Pulse 97   Temp 97.8 F (36.6 C) (Oral)   Resp 18   Ht 6\' 2"  (1.88 m)   Wt 129.3 kg   SpO2 99%   BMI 36.59 kg/m  Physical Exam General: NAD   ED Course / MDM  EKG:   I have reviewed the labs performed to date as well as medications administered while in observation.  Recent changes in the last 24 hours include no acute events reported.  Plan  Current plan is for placement.    Valarie Merino, MD 07/14/22 [phone]

## 2022-07-14 NOTE — Progress Notes (Signed)
Pt was accepted to [facility] 07/15/2022, pending negative covid faxed to [phone]  Pt meets inpatient criteria per Charmaine Downs, NP  Attending Physician will be Flossie Buffy, MD  Report can be called to: [phone] (this is is a pager, please leave call-back number when giving report)  Pt can arrive after 8 AM  Care Team Notified: Charmaine Downs, NP and Lynford Humphrey, RN  Denna Haggard, LCSWA  07/14/2022 10:46 AM

## 2022-07-14 NOTE — Progress Notes (Signed)
CSW received phone call from admissions at [facility]. Pt is currently under review for inpatient treatment. CSW will continue to assist and follow with placement.  Denna Haggard, [place]  07/14/2022 10:13 AM
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
## 2022-07-15 DIAGNOSIS — F201 Disorganized schizophrenia: Secondary | ICD-10-CM | POA: Diagnosis not present
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
## 2022-07-15 NOTE — ED Notes (Signed)
Pt given a snack, 2 graham crackers

## 2022-07-15 NOTE — Progress Notes (Signed)
Patient has been denied by [facility] due to no appropriate beds available. Patient meets [place] inpatient criteria per Charmaine Downs, NP. Patient has been faxed out to the following facilities:   [place] [address] [phone]  [facility]  [address] [place] [address] [phone]  [place]  [place], [place] [address] [phone]  [facility]  [place], [place] [address] [place]  [facility]  [address] [place] [address] [phone]  [place]  [place], [place] [address] [phone] [facility]  [address] [place], [place] [address] [phone] [facility]  [place] [address] [place]  [facility]  [address] [place] [address] [phone]  [facility]  [address], [place] [address] [phone]  [facility]  [address], [place] [address] [phone] [place]  [address] [place] [address] [phone]  [place]  [address] [place] [address] [phone] [facility]  [address], [place] [address] [place]  [address] [place] [address] [phone]  [facility]  [address] [place] [address] [place]  [facility]  [address], [place] [address] [phone] [place]  [address], [place] [address] [phone]  [facility]  [address] [place] [address] [phone] [place]  [place]., [place] [address] [phone] [place], [place] [address] [phone] [facility]  [address], [place] [address] [phone]  [facility]  [address], [place] [address] [place]  [facility] Office  [address], [place] [address] [phone] [place], MSW, LCSW-A  9:34 PM 07/15/2022

## 2022-07-15 NOTE — ED Notes (Signed)
[facility] denied patient

## 2022-07-15 NOTE — ED Notes (Signed)
Attempted report call to [facility], Sam RN stated that he would need to consult with admin to see if they can take this patient. Will call back.

## 2022-07-15 NOTE — Progress Notes (Signed)
CSW received phone call from [facility] admissions. Pt is denied by [facility] due to IDD diagnosis. CSW will continue to assist and follow with placement.  Brandon [name], [place]  07/15/2022 9:50 AM

## 2022-07-15 NOTE — ED Notes (Signed)
Patient having random outbursts of laughter and anger. Currently not violent or has been today. Constantly asking for food - verbalized understanding of snack time.

## 2022-07-15 NOTE — Progress Notes (Signed)
LCSW Progress Note  [id number]   Brandon [name]  07/15/2022  10:18 AM  Description:   Inpatient Psychiatric Referral  Patient was recommended inpatient per Charmaine Downs, NP. There are no available beds at [facility]. Patient was referred to the following facilities:   Destination  Service Provider Address Phone Fax  [place] [place] [address] [phone]  [facility]  [address] [place] [address] [phone]  [place]  [place], [place] [address] [phone]  [facility]  [place], [place] [address] [place]  [facility]  [address] [place] [address] [phone]  [place]  [place], [place] [address] [phone]  [facility]  [address] [place], [place] [address] [phone] [facility]  [place] [address] [place]  [facility]  [address] [place] [address] [facility]  [address], [place] [address] [phone] [place], [place] [address] [phone]  [facility]  [address], [place] [address] [phone]  [facility]  [address] [place] [address] [phone]  [facility]  [address] [place] [address] [place]  [facility]  [address], [place] [address] [phone]  [place]  [address], [place] [address] [phone]  [facility]  [address], [place] [address] [phone] [place]  [place]., [place] [address] [phone] [place], [place] [address] [phone] [facility]  [address] [place] [address] [phone]  [facility]  [address], [place] [address] [phone]  [facility] Office  [address], [place] [address] [phone]    Situation ongoing, CSW to continue following and update chart as more information becomes available.      Denna Haggard, [place]  07/15/2022 10:18 AM

## 2022-07-15 NOTE — ED Provider Notes (Signed)
Emergency Medicine Observation Re-evaluation Note  Brandon [name] is a 32 y.o. male, seen on rounds today.  Pt initially presented to the ED for complaints of Aggressive Behavior Currently, the patient is resting comfortably.  Physical Exam  BP 123/82 (BP Location: Left Arm)   Pulse 76   Temp 97.6 F (36.4 C) (Axillary)   Resp 18   Ht 6\' 2"  (1.88 m)   Wt 129.3 kg   SpO2 98%   BMI 36.59 kg/m  Physical Exam General: NAD Cardiac: Well perfused Resp: Even and unlabored Psych: No agitation   ED Course / MDM  EKG:   I have reviewed the labs performed to date as well as medications administered while in observation.  Recent changes in the last 24 hours include no acute events reported.  Plan  Current plan is for placement. SW coordinating.       Regan Lemming, MD 07/15/22 [phone]

## 2022-07-16 DIAGNOSIS — F201 Disorganized schizophrenia: Secondary | ICD-10-CM

## 2022-07-16 MED ORDER — BISACODYL 5 MG PO TBEC: 5.0000 mg | DELAYED_RELEASE_TABLET | Freq: Every day | ORAL | Status: DC

## 2022-07-16 MED ORDER — LORATADINE 10 MG PO TABS: 10.0000 mg | ORAL_TABLET | Freq: Every day | ORAL | Status: DC | PRN

## 2022-07-16 MED ORDER — NICOTINE POLACRILEX 2 MG MT GUM: 2.0000 mg | CHEWING_GUM | OROMUCOSAL | Status: DC | PRN

## 2022-07-16 MED ORDER — LORAZEPAM 0.5 MG PO TABS: 0.5000 mg | ORAL_TABLET | Freq: Two times a day (BID) | ORAL | Status: DC | PRN

## 2022-07-16 MED ORDER — LATANOPROST 0.005 % OP SOLN
1.0000 [drp] | Freq: Every day | OPHTHALMIC | Status: DC
  Filled 2022-07-16: qty 2.5

## 2022-07-16 MED ORDER — DOCUSATE SODIUM 100 MG PO CAPS
100.0000 mg | ORAL_CAPSULE | Freq: Two times a day (BID) | ORAL | Status: DC
  Administered 2022-07-16: 100 mg via ORAL
  Filled 2022-07-16: qty 1

## 2022-07-16 NOTE — ED Notes (Signed)
Patient discharged home per provider. Patient alert, cooperative, no s/s of distress at this time. Discharge information given to patient . Belongings given to patient. Patient ambulatory. Patient use w/c to go off unit, escorted by NT. [facility]  [facility]

## 2022-07-16 NOTE — Discharge Summary (Signed)
[facility] ED Discharge  07/16/2022 10:34 AM Brandon [name]  MRN:  [id number]  Principal Problem: Schizophrenia, disorganized type [facility]) Discharge Diagnoses: Principal Problem:   Schizophrenia, disorganized type ([place])  Clinical Impression:  Final diagnoses:  Aggressive behavior  Intellectual developmental disorder, moderate  History of schizophrenia  Impaired impulse control  Oppositional defiant behavior   Subjective: Brandon [name] is a 32 y.o. male patient admitted with IDD and Disorganized Schizophrenia brought in by [employer] on 07/13/22  for aggression and Psychosis.  Patient has known hx of aggression and agitation.  He has hx of multiple hospitalizations and ER Visits.  Patient was seen this morning calm, cooperative and admitted he has been eating and sleeping.  He has not had negative concerning behavior needing PRN medications.  Patient states he is ready to go back home. Provider spoke with mom and she is in agreement for Patient to come home.  She will call DR Akintayo's office to make appointment for her son to be seen.  Patient missed appointment this last Monday because he is here. Mother also agrees that patient can come home with a Taxi. Patient denies SI/HI/AVH and no mention of paranoia.  Mother already filled two weeks of Medication for patient to take before his appointment with his Psychiatrist.  Patient is Psychiatrically cleared. DR Dwyane Dee is in agreement with discharge plan .  Provider reviewed safety plan with mother -call 911 or 60, go to nearest ER or [place] for any Mental health crisis including but not limited to suicide ideation.  ED Assessment Time Calculation: Start Time: 1018 Stop Time: 1034 Total Time in Minutes (Assessment Completion): 16   Past Psychiatric History: see initial Psychiatric evaluation  Past Medical History:  Past Medical History:  Diagnosis Date   ADHD (attention deficit hyperactivity disorder)    Bipolar 1  disorder (HCC)    Chronic back pain    Chronic constipation    Chronic neck pain    Hypertension    Multiple sclerosis ([place]) 05/20/2013   left sided weakness, dysarthria   Non-compliance    Obesity    Pulmonary embolism ([place])    Schizophrenia (HCC)    Stroke ([place])    left sided deficits - pt's mother denies this    Past Surgical History:  Procedure Laterality Date   NO PAST SURGERIES     None     RADIOLOGY WITH ANESTHESIA N/A 01/16/2021   Procedure: MRI WITH ANESTHESIA CERVICAL AND THORASIC SPINE WITH AND WITHOUT CONTRAST;  Surgeon: Radiologist, Medication, MD;  Location: [place];  Service: Radiology;  Laterality: N/A;   TOOTH EXTRACTION N/A 06/24/2019   Procedure: DENTAL RESTORATION/EXTRACTION OF TEETH NUMBER ONE, SIXTEEN, SEVENTEEN, NINETEEN, THIRTY-TWO;  Surgeon: Diona Browner, DDS;  Location: [place];  Service: Oral Surgery;  Laterality: N/A;   Family History:  Family History  Problem Relation Age of Onset   Diabetes Mother    ADD / ADHD Brother    Family Psychiatric  History: see initial Psychiatric evaluation  Social History:  Social History   Substance and Sexual Activity  Alcohol Use Not Currently   Alcohol/week: 0.0 standard drinks of alcohol   Comment: "A little bit"      Social History   Substance and Sexual Activity  Drug Use Not Currently   Types: Marijuana   Comment: Last used: unknown     Social History   Socioeconomic History   Marital status: Single    Spouse name: Not on file   Number  of children: 0   Years of education: 11th   Highest education level: Not on file  Occupational History   Occupation: unemployed    Employer: Teacher, English as a foreign language    Comment: Disbaled  Tobacco Use   Smoking status: Every Day    Packs/day: 0.25    Types: Cigarettes   Smokeless tobacco: Never   Tobacco comments:    2 cigarettes a day  Vaping Use   Vaping Use: Never used  Substance and Sexual Activity   Alcohol use: Not Currently    Alcohol/week: 0.0 standard  drinks of alcohol    Comment: "A little bit"    Drug use: Not Currently    Types: Marijuana    Comment: Last used: unknown    Sexual activity: Not on file  Other Topics Concern   Not on file  Social History Narrative   Patient lives at home with his mother.   Disabled.   Education 11 th grade .   Right handed.   Drinks caffeine occassionally   Social Determinants of Corporate investment banker Strain: Not on file  Food Insecurity: Not on file  Transportation Needs: Not on file  Physical Activity: Not on file  Stress: Not on file  Social Connections: Not on file    Tobacco Cessation:  N/A, patient does not currently use tobacco products  Current Medications: Current Facility-Administered Medications  Medication Dose Route Frequency Provider Last Rate Last Admin   amantadine (SYMMETREL) capsule 100 mg  100 mg Oral BID Cathren Laine, MD   100 mg at 07/16/22 1000   amLODipine (NORVASC) tablet 5 mg  5 mg Oral Daily Cathren Laine, MD   5 mg at 07/16/22 1000   apixaban (ELIQUIS) tablet 5 mg  5 mg Oral BID Cathren Laine, MD   5 mg at 07/16/22 1001   benztropine (COGENTIN) tablet 1 mg  1 mg Oral BID Cathren Laine, MD   1 mg at 07/16/22 1000   bisacodyl (DULCOLAX) EC tablet 5 mg  5 mg Oral QHS Kommor, Madison, MD       divalproex (DEPAKOTE) DR tablet 500 mg  500 mg Oral Q12H Cathren Laine, MD   500 mg at 07/16/22 1000   docusate sodium (COLACE) capsule 100 mg  100 mg Oral BID Kommor, Madison, MD   100 mg at 07/16/22 1601   hydrOXYzine (ATARAX) tablet 25 mg  25 mg Oral TID PRN Cathren Laine, MD   25 mg at 07/15/22 2214   latanoprost (XALATAN) 0.005 % ophthalmic solution 1 drop  1 drop Both Eyes QHS Kommor, Madison, MD       loratadine (CLARITIN) tablet 10 mg  10 mg Oral Daily PRN Kommor, Madison, MD       LORazepam (ATIVAN) injection 2 mg  2 mg Intramuscular Once Wynetta Fines, MD       LORazepam (ATIVAN) tablet 0.5 mg  0.5 mg Oral BID PRN Kommor, Madison, MD       losartan (COZAAR)  tablet 100 mg  100 mg Oral Daily Cathren Laine, MD   100 mg at 07/16/22 1002   nicotine polacrilex (NICORETTE) gum 2 mg  2 mg Oral PRN Kommor, Madison, MD       OLANZapine (ZYPREXA) tablet 15 mg  15 mg Oral QHS Cathren Laine, MD   15 mg at 07/15/22 2127   paliperidone (INVEGA) 24 hr tablet 9 mg  9 mg Oral Daily Cathren Laine, MD   9 mg at 07/16/22 1001  Current Outpatient Medications  Medication Sig Dispense Refill   amantadine (SYMMETREL) 100 MG capsule Take 100 mg by mouth 2 (two) times daily.     amLODipine (NORVASC) 5 MG tablet Take 5 mg by mouth in the morning.     apixaban (ELIQUIS) 5 MG TABS tablet Take 1 tablet (5 mg total) by mouth 2 (two) times daily. 60 tablet 0   divalproex (DEPAKOTE) 500 MG DR tablet Take 1 tablet (500 mg total) by mouth every 12 (twelve) hours. 60 tablet 0   docusate sodium (COLACE) 100 MG capsule Take 1 capsule (100 mg total) by mouth 2 (two) times daily. 60 capsule 0   DULCOLAX 5 MG EC tablet Take 5 mg by mouth at bedtime.     haloperidol (HALDOL) 10 MG tablet Take 1 tablet (10 mg total) by mouth 2 (two) times daily. 60 tablet 0   hydrOXYzine (ATARAX) 25 MG tablet Take 25 mg by mouth 3 (three) times daily.     loratadine (CLARITIN) 10 MG tablet Take 1 tablet (10 mg total) by mouth daily as needed for allergies. 30 tablet 0   LORazepam (ATIVAN) 0.5 MG tablet Take 0.5 mg by mouth 2 (two) times daily as needed for anxiety or sedation.     losartan (COZAAR) 100 MG tablet Take 100 mg by mouth daily.     LUMIGAN 0.01 % SOLN Place 1 drop into both eyes at bedtime. 2.5 mL 0   NICORETTE 2 MG gum Take 2 mg by mouth as needed for smoking cessation.     ocrelizumab (OCREVUS) 300 MG/10ML injection Inject 2 vials ( 20 MLs) into the vein every 6 months (Patient taking differently: Inject 600 mg into the vein See admin instructions. 600 mg, via IV, every 8 months) 20 mL 0   OLANZapine (ZYPREXA) 15 MG tablet Take 1 tablet (15 mg total) by mouth at bedtime. 30 tablet 0    paliperidone (INVEGA) 9 MG 24 hr tablet Take 9 mg by mouth in the morning.     temazepam (RESTORIL) 22.5 MG capsule Take 22.5 mg by mouth at bedtime as needed for sleep.     benztropine (COGENTIN) 1 MG tablet Take 1 tablet (1 mg total) by mouth 2 (two) times daily. (Patient not taking: Reported on 07/15/2022) 60 tablet 0   losartan (COZAAR) 50 MG tablet Take 1 tablet (50 mg total) by mouth 2 (two) times daily. (Patient not taking: Reported on 07/15/2022) 60 tablet 0   nicotine (NICODERM CQ - DOSED IN MG/24 HOURS) 14 mg/24hr patch Place 1 patch (14 mg total) onto the skin daily. (Patient not taking: Reported on 07/15/2022) 28 patch 0   topiramate (TOPAMAX) 25 MG tablet TAKE ONE TABLET BY MOUTH TWICE A DAY (Patient not taking: Reported on 07/15/2022) 60 tablet 4   PTA Medications: (Not in a hospital admission)   [place] Scale:  [place] ED from 07/13/2022 in [place] DEPT ED from 07/11/2022 in [place] ED from 06/23/2022 in [place] Risk No Risk No Risk       Musculoskeletal: Strength & Muscle Tone: within normal limits Gait & Station: normal Patient leans: Front  Psychiatric Specialty Exam: Presentation  General Appearance:  Casual; Fairly Groomed  Eye Contact: Good  Speech: Clear and Coherent; Normal Rate  Speech Volume: Normal  Handedness: Right   Mood and Affect  Mood: Euthymic  Affect: Congruent   Thought Process  Thought Processes: Coherent; Linear  Descriptions of Associations:Intact  Orientation:Partial  Thought Content:Logical; Illogical  History of Schizophrenia/Schizoaffective disorder:Yes  Duration of Psychotic Symptoms:Greater than six months  Hallucinations:Hallucinations: None  Ideas of Reference:None  Suicidal Thoughts:Suicidal Thoughts: No  Homicidal Thoughts:Homicidal Thoughts: No   Sensorium   Memory: Immediate Fair; Recent Fair; Remote Fair  Judgment: Fair  Insight: Fair   Community education officer  Concentration: Fair  Attention Span: Fair  Recall: [employer] of Knowledge: Fair  Language: Fair   Psychomotor Activity  Psychomotor Activity: Psychomotor Activity: Normal   Assets  Assets: Housing; Armed forces logistics/support/administrative officer; Social Support   Sleep  Sleep: Sleep: Good    Physical Exam: Physical Exam ROS Blood pressure 132/78, pulse 92, temperature 98.1 F (36.7 C), temperature source Oral, resp. rate 16, height 6\' 2"  (1.88 m), weight 129.3 kg, SpO2 100 %. Body mass index is 36.59 kg/m.   Demographic Factors:  Male, Adolescent or young adult, and Unemployed  Loss Factors: NA  Historical Factors: NA  Risk Reduction Factors:   Living with another person, especially a relative, Positive social support, and lives with mother, mother is very supportive  Continued Clinical Symptoms:  Schizophrenia:   Depressive state  Cognitive Features That Contribute To Risk:  None    Suicide Risk:  Minimal: No identifiable suicidal ideation.  Patients presenting with no risk factors but with morbid ruminations; may be classified as minimal risk based on the severity of the depressive symptoms    Plan Of Care/Follow-up recommendations:  Activity:  as tolerated Diet:  Regular  Medical Decision Making: Patient is at base line.  He has been compliant with his medications since arrival to the ER.  He eats and sleep as he wants, no PRN medications required.  His mother is making appointment with Psychiatrist as soon as possible for post discharge assessment.  Patient denies SI/HI/AVH and he is Psychiatrically cleared.   Disposition: Psychiatrically cleared.    Delfin Gant, NP-PMHNP-BC 07/16/2022, 10:34 AM

## 2022-07-16 NOTE — ED Provider Notes (Signed)
Emergency Medicine Observation Re-evaluation Note  Brandon [name] is a 32 y.o. male, seen on rounds today.  Pt initially presented to the ED for complaints of Aggressive Behavior Currently, the patient is resting comfortably.  Physical Exam  BP (!) 158/115 (BP Location: Left Arm)   Pulse 90   Temp 98.8 F (37.1 C) (Oral)   Resp 20   Ht 6\' 2"  (1.88 m)   Wt 129.3 kg   SpO2 100%   BMI 36.59 kg/m  Physical Exam Vitals and nursing note reviewed.  Constitutional:      General: He is not in acute distress.    Appearance: He is well-developed.  HENT:     Head: Normocephalic and atraumatic.  Eyes:     Conjunctiva/sclera: Conjunctivae normal.  Cardiovascular:     Rate and Rhythm: Normal rate and regular rhythm.     Heart sounds: No murmur heard. Pulmonary:     Effort: Pulmonary effort is normal. No respiratory distress.  Musculoskeletal:        General: No swelling.     Cervical back: Neck supple.  Skin:    General: Skin is warm and dry.  Neurological:     Mental Status: He is alert.  Psychiatric:        Mood and Affect: Mood normal.      ED Course / MDM  EKG:   I have reviewed the labs performed to date as well as medications administered while in observation.  Recent changes in the last 24 hours include [facility] assessment and determination of inpatient criteria.  Plan  Current plan is for placement in psychiatric facility.    Teressa Lower, MD 07/16/22 [phone]

## 2022-07-17 ENCOUNTER — Other Ambulatory Visit: Payer: Self-pay

## 2022-07-18 ENCOUNTER — Other Ambulatory Visit (HOSPITAL_COMMUNITY): Payer: Self-pay

## 2022-07-22 ENCOUNTER — Other Ambulatory Visit (HOSPITAL_COMMUNITY): Payer: Self-pay

## 2022-07-23 ENCOUNTER — Ambulatory Visit (INDEPENDENT_AMBULATORY_CARE_PROVIDER_SITE_OTHER): Payer: Medicare Other | Admitting: Neurology

## 2022-07-23 ENCOUNTER — Encounter: Payer: Self-pay | Admitting: Neurology

## 2022-07-23 ENCOUNTER — Other Ambulatory Visit (HOSPITAL_COMMUNITY): Payer: Self-pay

## 2022-07-23 VITALS — BP 191/133 | HR 98 | Ht 73.0 in | Wt 317.5 lb

## 2022-07-23 DIAGNOSIS — F79 Unspecified intellectual disabilities: Secondary | ICD-10-CM

## 2022-07-23 DIAGNOSIS — R269 Unspecified abnormalities of gait and mobility: Secondary | ICD-10-CM

## 2022-07-23 DIAGNOSIS — G35 Multiple sclerosis: Secondary | ICD-10-CM

## 2022-07-23 DIAGNOSIS — F259 Schizoaffective disorder, unspecified: Secondary | ICD-10-CM | POA: Diagnosis not present

## 2022-07-23 DIAGNOSIS — R4689 Other symptoms and signs involving appearance and behavior: Secondary | ICD-10-CM

## 2022-07-23 DIAGNOSIS — G35D Multiple sclerosis, unspecified: Secondary | ICD-10-CM

## 2022-07-23 DIAGNOSIS — Z79899 Other long term (current) drug therapy: Secondary | ICD-10-CM | POA: Diagnosis not present

## 2022-07-23 DIAGNOSIS — E66813 Obesity, class 3: Secondary | ICD-10-CM

## 2022-07-23 NOTE — Progress Notes (Addendum)
[facility]  PATIENT: Brandon [name] DOB: Feb 07, 1991  REFERRING DOCTOR OR PCP: [facility] SOURCE: Patient, notes from other providers at [facility], imaging and lab reports, MRI images personally reviewed  _________________________________   HISTORICAL  CHIEF COMPLAINT:  Chief Complaint  Patient presents with   Room 10    Pt is here with his Grandmother. Pt states that things have been going good. Pt states no muscle weakness. Pt states that his memory is good.     HISTORY OF PRESENT ILLNESS:  Brandon [name] is a 32 year old man who was diagnosed with MS at age 75.  Update 07/23/2022 He is a 32 year old man with multiple sclerosis, schizophrenia, and a gait disorder.   He is on Ocrevus and tolerates it well.  MS History: In 2012, he had difficulty with gait and had a few falls.  This led to an evaluation with MRI imaging and the diagnosis of multiple sclerosis.Marland Kitchen  He saw Dr. Anne Hahn and was placed on Tecfidera.  He tolerated Tecfidera fairly well.  However, according to his mother (I spoke to her over the phone), he had multiple exacerbations and was using a wheelchair several times due to difficulties with his gait.  He received IV steroids on his 1 occasion.  He was switched to Tysabri around 2016 and then to Ocrevus around 2018 to allow for fewer infusions.  The mother feels that since he has been on Ocrevus he has not had any more exacerbations.  Due to low IgM, the dosing interval was increased to 8 months from 6 months.  Currently, his gait is off balance but he has no recent falls.  He denies numbness.  The left leg is weaker than the right and this worsens when he walks further.  He has not had difficulty controlling the bowels or bladder.  He denies difficulties with vision.  He sleeps poorly at night and is often up keeping the family awake.  During the day he sometimes takes short naps but never takes long  naps.  He has schizoaffective disease and sees psychiatry.  He is currently on Haldol, olanzapine, Atarax, Depakote and benztropine. He has had cognitive issues noted when he was in elementary school and was diagnosed with developmental delay.  Due to claustrophobia he has not had recent MRI.    Labs: Igm = 10 low Iga=69 Igg 629  low normal    REVIEW OF SYSTEMS: Constitutional: No fevers, chills, sweats, or change in appetite Eyes: No visual changes, double vision, eye pain Ear, nose and throat: No hearing loss, ear pain, nasal congestion, sore throat Cardiovascular: No chest pain, palpitations Respiratory:  No shortness of breath at rest or with exertion.   No wheezes GastrointestinaI: No nausea, vomiting, diarrhea, abdominal pain, fecal incontinence Genitourinary:  No dysuria, urinary retention or frequency.  No nocturia. Musculoskeletal:  No neck pain, back pain Integumentary: No rash, pruritus, skin lesions Neurological: as above Psychiatric: No depression at this time.  No anxiety Endocrine: No palpitations, diaphoresis, change in appetite, change in weigh or increased thirst Hematologic/Lymphatic:  No anemia, purpura, petechiae. Allergic/Immunologic: No itchy/runny eyes, nasal congestion, recent allergic reactions, rashes  ALLERGIES: Allergies  Allergen Reactions   Divalproex Sodium Other (See Comments)    Causes excessive salivation/unintentional drooling    HOME MEDICATIONS:  Current Outpatient Medications:    amantadine (SYMMETREL) 100 MG capsule, Take 100 mg by mouth 2 (two) times daily., Disp: , Rfl:    amLODipine (NORVASC) 5  MG tablet, Take 5 mg by mouth in the morning., Disp: , Rfl:    apixaban (ELIQUIS) 5 MG TABS tablet, Take 1 tablet (5 mg total) by mouth 2 (two) times daily., Disp: 60 tablet, Rfl: 0   benztropine (COGENTIN) 1 MG tablet, Take 1 tablet (1 mg total) by mouth 2 (two) times daily., Disp: 60 tablet, Rfl: 0   divalproex (DEPAKOTE) 500 MG DR  tablet, Take 1 tablet (500 mg total) by mouth every 12 (twelve) hours., Disp: 60 tablet, Rfl: 0   docusate sodium (COLACE) 100 MG capsule, Take 1 capsule (100 mg total) by mouth 2 (two) times daily., Disp: 60 capsule, Rfl: 0   DULCOLAX 5 MG EC tablet, Take 5 mg by mouth at bedtime., Disp: , Rfl:    haloperidol (HALDOL) 10 MG tablet, Take 1 tablet (10 mg total) by mouth 2 (two) times daily., Disp: 60 tablet, Rfl: 0   hydrOXYzine (ATARAX) 25 MG tablet, Take 25 mg by mouth 3 (three) times daily., Disp: , Rfl:    loratadine (CLARITIN) 10 MG tablet, Take 1 tablet (10 mg total) by mouth daily as needed for allergies., Disp: 30 tablet, Rfl: 0   LORazepam (ATIVAN) 0.5 MG tablet, Take 0.5 mg by mouth 2 (two) times daily as needed for anxiety or sedation., Disp: , Rfl:    losartan (COZAAR) 100 MG tablet, Take 100 mg by mouth daily., Disp: , Rfl:    losartan (COZAAR) 50 MG tablet, Take 1 tablet (50 mg total) by mouth 2 (two) times daily., Disp: 60 tablet, Rfl: 0   LUMIGAN 0.01 % SOLN, Place 1 drop into both eyes at bedtime., Disp: 2.5 mL, Rfl: 0   NICORETTE 2 MG gum, Take 2 mg by mouth as needed for smoking cessation., Disp: , Rfl:    nicotine (NICODERM CQ - DOSED IN MG/24 HOURS) 14 mg/24hr patch, Place 1 patch (14 mg total) onto the skin daily., Disp: 28 patch, Rfl: 0   ocrelizumab (OCREVUS) 300 MG/10ML injection, Inject 2 vials ( 20 MLs) into the vein every 6 months (Patient taking differently: Inject 600 mg into the vein See admin instructions. 600 mg, via IV, every 8 months), Disp: 20 mL, Rfl: 0   OLANZapine (ZYPREXA) 15 MG tablet, Take 1 tablet (15 mg total) by mouth at bedtime., Disp: 30 tablet, Rfl: 0   paliperidone (INVEGA) 9 MG 24 hr tablet, Take 9 mg by mouth in the morning., Disp: , Rfl:    temazepam (RESTORIL) 22.5 MG capsule, Take 22.5 mg by mouth at bedtime as needed for sleep., Disp: , Rfl:    topiramate (TOPAMAX) 25 MG tablet, TAKE ONE TABLET BY MOUTH TWICE A DAY, Disp: 60 tablet, Rfl: 4  PAST  MEDICAL HISTORY: Past Medical History:  Diagnosis Date   ADHD (attention deficit hyperactivity disorder)    Bipolar 1 disorder (HCC)    Chronic back pain    Chronic constipation    Chronic neck pain    Hypertension    Multiple sclerosis (HCC) 05/20/2013   left sided weakness, dysarthria   Non-compliance    Obesity    Pulmonary embolism (HCC)    Schizophrenia (HCC)    Stroke (HCC)    left sided deficits - pt's mother denies this    PAST SURGICAL HISTORY: Past Surgical History:  Procedure Laterality Date   NO PAST SURGERIES     None     RADIOLOGY WITH ANESTHESIA N/A 01/16/2021   Procedure: MRI WITH ANESTHESIA CERVICAL AND THORASIC SPINE WITH AND  WITHOUT CONTRAST;  Surgeon: Radiologist, Medication, MD;  Location: [facility] OR;  Service: Radiology;  Laterality: N/A;   TOOTH EXTRACTION N/A 06/24/2019   Procedure: DENTAL RESTORATION/EXTRACTION OF TEETH NUMBER ONE, SIXTEEN, SEVENTEEN, NINETEEN, THIRTY-TWO;  Surgeon: Ocie Doyne, DDS;  Location: [facility] OR;  Service: Oral Surgery;  Laterality: N/A;    FAMILY HISTORY: Family History  Problem Relation Age of Onset   Diabetes Mother    ADD / ADHD Brother     SOCIAL HISTORY: Social History   Socioeconomic History   Marital status: Single    Spouse name: Not on file   Number of children: 0   Years of education: 11th   Highest education level: Not on file  Occupational History   Occupation: unemployed    Employer: [employer]    Comment: Disbaled  Tobacco Use   Smoking status: Every Day    Packs/day: 0.25    Types: Cigarettes   Smokeless tobacco: Never   Tobacco comments:    2 cigarettes a day  Vaping Use   Vaping Use: Never used  Substance and Sexual Activity   Alcohol use: Not Currently    Alcohol/week: 0.0 standard drinks of alcohol    Comment: "A little bit"    Drug use: Not Currently    Types: Marijuana    Comment: Last used: unknown    Sexual activity: Not on file  Other Topics Concern   Not on file  Social  History Narrative   Patient lives at home with his mother.   Disabled.   Education 11 th grade .   Right handed.   Drinks caffeine occassionally   Social Determinants of Corporate investment banker Strain: Not on file  Food Insecurity: Not on file  Transportation Needs: Not on file  Physical Activity: Not on file  Stress: Not on file  Social Connections: Not on file  Intimate Partner Violence: Not on file       PHYSICAL EXAM  Vitals:   07/23/22 1406  BP: (!) 191/133  Pulse: 98  Weight: (!) 317 lb 8 oz (144 kg)  Height: 6\' 1"  (1.854 m)    Body mass index is 41.89 kg/m.   General: The patient is well-developed and well-nourished and in no acute distress  HEENT:  Head is [place]/AT.  Sclera are anicteric.  Funduscopic exam shows normal optic discs and retinal vessels.  Neck: No carotid bruits are noted.  The neck is nontender.  Cardiovascular: The heart has a regular rate and rhythm with a normal S1 and S2. There were no murmurs, gallops or rubs.    Skin: Extremities are without rash or  edema.  Musculoskeletal:  Back is nontender  Neurologic Exam  Mental status: The patient is alert and oriented x 3 at the time of the examination. The patient has apparent normal recent and remote memory, with an apparently normal attention span and concentration ability.   Speech is normal.  Cranial nerves: Extraocular movements are full. Pupils are equal, round, and reactive to light and accomodation.  Visual fields are full.  Facial symmetry is present. There is good facial sensation to soft touch bilaterally.Facial strength is normal.  Trapezius and sternocleidomastoid strength is normal. No dysarthria is noted.  The tongue is midline, and the patient has symmetric elevation of the soft palate. No obvious hearing deficits are noted.  Motor:  Muscle bulk is normal.   Tone is normal. Strength is  5 / 5 in all 4 extremities.   Sensory:  Sensory testing is intact to pinprick, soft touch  and vibration sensation in all 4 extremities.  Coordination: Cerebellar testing reveals good finger-nose-finger and heel-to-shin bilaterally.  Gait and station: Station is normal.   Gait is normal. Tandem gait is normal. Romberg is negative.   Reflexes: Deep tendon reflexes are symmetric and normal bilaterally.   Plantar responses are flexor.    DIAGNOSTIC DATA (LABS, IMAGING, TESTING) - I reviewed patient records, labs, notes, testing and imaging myself where available.  Lab Results  Component Value Date   WBC 7.6 07/11/2022   HGB 12.9 (L) 07/11/2022   HCT 41.5 07/11/2022   MCV 81.4 07/11/2022   PLT 191 07/11/2022      Component Value Date/Time   NA 142 07/11/2022 0021   NA 144 07/11/2021 1014   K 3.5 07/11/2022 0021   CL 105 07/11/2022 0021   CO2 24 07/11/2022 0021   GLUCOSE 138 (H) 07/11/2022 0021   BUN 12 07/11/2022 0021   BUN 12 07/11/2021 1014   CREATININE 0.99 07/11/2022 0021   CALCIUM 10.0 07/11/2022 0021   PROT 6.5 07/11/2022 0021   PROT 5.3 (L) 07/11/2021 1014   ALBUMIN 4.1 07/11/2022 0021   ALBUMIN 4.0 (L) 07/11/2021 1014   AST 21 07/11/2022 0021   ALT 43 07/11/2022 0021   ALKPHOS 67 07/11/2022 0021   BILITOT 0.4 07/11/2022 0021   BILITOT 0.2 07/11/2021 1014   GFRNONAA >60 07/11/2022 0021   GFRAA >60 04/04/2020 2158   Lab Results  Component Value Date   CHOL 152 10/29/2021   HDL 36 (L) 10/29/2021   LDLCALC 77 10/29/2021   TRIG 196 (H) 10/29/2021   CHOLHDL 4.2 10/29/2021   Lab Results  Component Value Date   HGBA1C 5.6 10/29/2021   Lab Results  Component Value Date   VITAMINB12 738 01/15/2021   Lab Results  Component Value Date   TSH 1.876 08/12/2021       ASSESSMENT AND PLAN  Multiple sclerosis (HCC) - Plan: IgG, IgA, IgM, CBC with Differential/Platelet  Schizoaffective disorder, unspecified type (HCC)  High risk medication use - Plan: IgG, IgA, IgM, CBC with Differential/Platelet  Unspecified intellectual  disabilities  Aggressive behavior  Obesity, Class III, BMI 40-49.9 (morbid obesity) (HCC)  Abnormality of gait   Continue Ocrevus.  I will check some lab work today. Stay active and exercise as tolerated.  Try to lose weight. Advised to discuss sleep issues further with psychiatry as change in therapy may be of benefit.  His family does not think that he would be able to use CPAP if OSA is diagnosed.  Weight loss was encouraged.   Return in 6 months or sooner if there are new or worsening neurologic symptoms.  ADDENDUM:  Due to low IgM will increase the dosing interval --- dose will be 600 mg IV q 8 months  45-minute office visit with the majority of the time spent face-to-face for history and physical, discussion/counseling and decision-making.  Additional time with record review and documentation.  Jaclin Finks A. Epimenio Foot, MD, [facility] 07/23/2022, 6:12 PM Certified in Neurology, Clinical Neurophysiology, Sleep Medicine and Neuroimaging  [facility] [address] [place], [place] [address] [phone]

## 2022-07-24 ENCOUNTER — Other Ambulatory Visit (HOSPITAL_COMMUNITY): Payer: Self-pay

## 2022-07-24 LAB — IGG, IGA, IGM
IgA/Immunoglobulin A, Serum: 70 mg/dL — ABNORMAL LOW (ref 90–386)
IgG (Immunoglobin G), Serum: 757 mg/dL (ref 603–1613)
IgM (Immunoglobulin M), Srm: 8 mg/dL — ABNORMAL LOW (ref 20–172)

## 2022-07-24 LAB — CBC WITH DIFFERENTIAL/PLATELET
Basophils Absolute: 0 10*3/uL (ref 0.0–0.2)
Basos: 0 %
EOS (ABSOLUTE): 0.1 10*3/uL (ref 0.0–0.4)
Eos: 2 %
Hematocrit: 40 % (ref 37.5–51.0)
Hemoglobin: 12.6 g/dL — ABNORMAL LOW (ref 13.0–17.7)
Immature Grans (Abs): 0 10*3/uL (ref 0.0–0.1)
Immature Granulocytes: 0 %
Lymphocytes Absolute: 1.9 10*3/uL (ref 0.7–3.1)
Lymphs: 34 %
MCH: 25.3 pg — ABNORMAL LOW (ref 26.6–33.0)
MCHC: 31.5 g/dL (ref 31.5–35.7)
MCV: 80 fL (ref 79–97)
Monocytes Absolute: 0.5 10*3/uL (ref 0.1–0.9)
Monocytes: 8 %
Neutrophils Absolute: 3.1 10*3/uL (ref 1.4–7.0)
Neutrophils: 56 %
Platelets: 179 10*3/uL (ref 150–450)
RBC: 4.98 x10E6/uL (ref 4.14–5.80)
RDW: 14.4 % (ref 11.6–15.4)
WBC: 5.6 10*3/uL (ref 3.4–10.8)

## 2022-07-25 ENCOUNTER — Other Ambulatory Visit: Payer: Self-pay | Admitting: Adult Health

## 2022-07-25 ENCOUNTER — Other Ambulatory Visit (HOSPITAL_COMMUNITY): Payer: Self-pay

## 2022-07-30 ENCOUNTER — Other Ambulatory Visit: Payer: Self-pay | Admitting: Adult Health

## 2022-07-30 ENCOUNTER — Other Ambulatory Visit (HOSPITAL_COMMUNITY): Payer: Self-pay

## 2022-07-31 ENCOUNTER — Other Ambulatory Visit (HOSPITAL_COMMUNITY): Payer: Self-pay

## 2022-07-31 MED ORDER — OCREVUS 300 MG/10ML IV SOLN
INTRAVENOUS | 0 refills | Status: DC
  Filled 2022-07-31: qty 20, 180d supply, fill #0

## 2022-08-01 ENCOUNTER — Other Ambulatory Visit: Payer: Self-pay

## 2022-08-04 ENCOUNTER — Ambulatory Visit (HOSPITAL_COMMUNITY)
Admission: EM | Admit: 2022-08-04 | Discharge: 2022-08-04 | Disposition: A | Discharge status: Home / Self Care | Payer: Medicare Other | Attending: Family Medicine | Admitting: Family Medicine

## 2022-08-04 ENCOUNTER — Encounter (HOSPITAL_COMMUNITY): Payer: Self-pay | Admitting: Emergency Medicine

## 2022-08-04 DIAGNOSIS — Z4802 Encounter for removal of sutures: Secondary | ICD-10-CM | POA: Diagnosis not present

## 2022-08-04 NOTE — ED Triage Notes (Signed)
Pt here to get staples out of head that were placed in ED back in December. Denies any complications.

## 2022-08-04 NOTE — ED Provider Notes (Signed)
[place]   [id number] 08/04/22 Arrival Time: 8527  ASSESSMENT & PLAN:  1. Encounter for staple removal    Staples removed by RN; no complications. See AVS for d/c information.   Follow-up Information     Medicine, [facility]   Specialty: Family Medicine Why: As needed. Contact information: [place] [address] [phone]                 Reviewed expectations re: course of current medical issues. Questions answered. Outlined signs and symptoms indicating need for more acute intervention. Understanding verbalized. After Visit Summary given.   SUBJECTIVE: History from: Patient. Brandon [name] is a 32 y.o. male. Reports: need for staple removal; placed in ED 06/24/22. No concerns.  OBJECTIVE:  Vitals:   08/04/22 1117  BP: (!) 137/96  Pulse: 85  Resp: 20  Temp: 97.8 F (36.6 C)  SpO2: 98%    General appearance: alert; no distress HENT: two intact staples on superior scalp; wound has healed well Psychological: alert and cooperative; normal mood and affect   Allergies  Allergen Reactions   Divalproex Sodium Other (See Comments)    Causes excessive salivation/unintentional drooling    Past Medical History:  Diagnosis Date   ADHD (attention deficit hyperactivity disorder)    Bipolar 1 disorder (HCC)    Chronic back pain    Chronic constipation    Chronic neck pain    Hypertension    Multiple sclerosis ([place]) 05/20/2013   left sided weakness, dysarthria   Non-compliance    Obesity    Pulmonary embolism (HCC)    Schizophrenia (HCC)    Stroke (HCC)    left sided deficits - pt's mother denies this   Social History   Socioeconomic History   Marital status: Single    Spouse name: Not on file   Number of children: 0   Years of education: 11th   Highest education level: Not on file  Occupational History   Occupation: unemployed    Employer: [employer]    Comment:  Disbaled  Tobacco Use   Smoking status: Every Day    Packs/day: 0.25    Types: Cigarettes   Smokeless tobacco: Never   Tobacco comments:    2 cigarettes a day  Vaping Use   Vaping Use: Never used  Substance and Sexual Activity   Alcohol use: Not Currently    Alcohol/week: 0.0 standard drinks of alcohol    Comment: "A little bit"    Drug use: Not Currently    Types: Marijuana    Comment: Last used: unknown    Sexual activity: Not on file  Other Topics Concern   Not on file  Social History Narrative   Patient lives at home with his mother.   Disabled.   Education 11 th grade .   Right handed.   Drinks caffeine occassionally   Social Determinants of Radio broadcast assistant Strain: Not on file  Food Insecurity: Not on file  Transportation Needs: Not on file  Physical Activity: Not on file  Stress: Not on file  Social Connections: Not on file  Intimate Partner Violence: Not on file   Family History  Problem Relation Age of Onset   Diabetes Mother    ADD / ADHD Brother    Past Surgical History:  Procedure Laterality Date   NO PAST SURGERIES     None     RADIOLOGY WITH ANESTHESIA N/A 01/16/2021  Procedure: MRI WITH ANESTHESIA CERVICAL AND THORASIC SPINE WITH AND WITHOUT CONTRAST;  Surgeon: Radiologist, Medication, MD;  Location: [facility];  Service: Radiology;  Laterality: N/A;   TOOTH EXTRACTION N/A 06/24/2019   Procedure: DENTAL RESTORATION/EXTRACTION OF TEETH NUMBER ONE, SIXTEEN, SEVENTEEN, NINETEEN, THIRTY-TWO;  Surgeon: Diona Browner, DDS;  Location: [facility];  Service: Oral Surgery;  Laterality: N/AVanessa Kick, MD 08/04/22 1131

## 2022-08-06 ENCOUNTER — Other Ambulatory Visit: Payer: Self-pay

## 2022-08-07 ENCOUNTER — Other Ambulatory Visit: Payer: Self-pay

## 2022-08-08 ENCOUNTER — Encounter (HOSPITAL_COMMUNITY): Payer: Medicare Other

## 2022-08-08 ENCOUNTER — Other Ambulatory Visit: Payer: Self-pay

## 2022-08-08 ENCOUNTER — Other Ambulatory Visit (HOSPITAL_COMMUNITY): Payer: Self-pay

## 2022-08-11 ENCOUNTER — Other Ambulatory Visit: Payer: Self-pay | Admitting: *Deleted

## 2022-08-11 DIAGNOSIS — G35 Multiple sclerosis: Secondary | ICD-10-CM

## 2022-08-11 DIAGNOSIS — Z79899 Other long term (current) drug therapy: Secondary | ICD-10-CM

## 2022-08-12 ENCOUNTER — Emergency Department (HOSPITAL_COMMUNITY)
Admission: EM | Admit: 2022-08-12 | Discharge: 2022-08-12 | Disposition: A | Discharge status: Home / Self Care | Payer: Medicare Other | Attending: Emergency Medicine | Admitting: Emergency Medicine

## 2022-08-12 ENCOUNTER — Emergency Department (HOSPITAL_COMMUNITY): Payer: Medicare Other

## 2022-08-12 ENCOUNTER — Other Ambulatory Visit: Payer: Self-pay

## 2022-08-12 DIAGNOSIS — I10 Essential (primary) hypertension: Secondary | ICD-10-CM | POA: Insufficient documentation

## 2022-08-12 DIAGNOSIS — Z20822 Contact with and (suspected) exposure to covid-19: Secondary | ICD-10-CM | POA: Diagnosis not present

## 2022-08-12 DIAGNOSIS — R519 Headache, unspecified: Secondary | ICD-10-CM | POA: Diagnosis not present

## 2022-08-12 DIAGNOSIS — R002 Palpitations: Secondary | ICD-10-CM | POA: Diagnosis not present

## 2022-08-12 DIAGNOSIS — R0789 Other chest pain: Secondary | ICD-10-CM | POA: Diagnosis present

## 2022-08-12 DIAGNOSIS — Z79899 Other long term (current) drug therapy: Secondary | ICD-10-CM | POA: Insufficient documentation

## 2022-08-12 DIAGNOSIS — F410 Panic disorder [episodic paroxysmal anxiety] without agoraphobia: Secondary | ICD-10-CM

## 2022-08-12 DIAGNOSIS — Z7901 Long term (current) use of anticoagulants: Secondary | ICD-10-CM | POA: Insufficient documentation

## 2022-08-12 DIAGNOSIS — F419 Anxiety disorder, unspecified: Secondary | ICD-10-CM

## 2022-08-12 LAB — CBC
HCT: 39.6 % (ref 39.0–52.0)
Hemoglobin: 12.4 g/dL — ABNORMAL LOW (ref 13.0–17.0)
MCH: 26.3 pg (ref 26.0–34.0)
MCHC: 31.3 g/dL (ref 30.0–36.0)
MCV: 84.1 fL (ref 80.0–100.0)
Platelets: 164 10*3/uL (ref 150–400)
RBC: 4.71 MIL/uL (ref 4.22–5.81)
RDW: 15.2 % (ref 11.5–15.5)
WBC: 7.5 10*3/uL (ref 4.0–10.5)
nRBC: 0 % (ref 0.0–0.2)

## 2022-08-12 LAB — RESP PANEL BY RT-PCR (RSV, FLU A&B, COVID)  RVPGX2
Influenza A by PCR: NEGATIVE
Influenza B by PCR: NEGATIVE
Resp Syncytial Virus by PCR: NEGATIVE
SARS Coronavirus 2 by RT PCR: NEGATIVE

## 2022-08-12 LAB — BASIC METABOLIC PANEL
Anion gap: 9 (ref 5–15)
BUN: 13 mg/dL (ref 6–20)
CO2: 28 mmol/L (ref 22–32)
Calcium: 9 mg/dL (ref 8.9–10.3)
Chloride: 103 mmol/L (ref 98–111)
Creatinine, Ser: 1.05 mg/dL (ref 0.61–1.24)
GFR, Estimated: >60 mL/min (ref >60)
Glucose, Bld: 83 mg/dL (ref 70–99)
Potassium: 3.7 mmol/L (ref 3.5–5.1)
Sodium: 140 mmol/L (ref 135–145)

## 2022-08-12 LAB — TROPONIN I (HIGH SENSITIVITY)
Troponin I (High Sensitivity): 5 ng/L (ref <18)
Troponin I (High Sensitivity): 5 ng/L (ref <18)

## 2022-08-12 MED ORDER — LOSARTAN POTASSIUM 25 MG PO TABS
100.0000 mg | ORAL_TABLET | Freq: Every day | ORAL | Status: DC
  Administered 2022-08-12: 100 mg via ORAL
  Filled 2022-08-12: qty 4

## 2022-08-12 NOTE — ED Triage Notes (Signed)
Patient brought in by EMS from home after c/o feeling like his heart is beating out of chest. Per EMS his mother states patient is having a psychiatric breakdown. Per mom when is is told to do something and does not want to he will act out.  He has HX of IDD and HTN. He takes Losartan for BP but RX has expired he has only been taking Amlodipine.   CBG:100 99% RA

## 2022-08-12 NOTE — ED Provider Notes (Signed)
[facility] Provider Note   CSN: [id number] Arrival date & time: 08/12/22  1625     History  Chief Complaint  Patient presents with   Psychiatric Evaluation    Brandon [name] is a 32 y.o. male with past medical history bipolar disorder, ADHD, hypertension who presents to the ED complaining of 4 days of intermittent chest tightness and palpitations.  He states that he has these episodes approximately 4 times per day and they self resolve after a few minutes.  Patient states that he has a history of panic attacks and that at times these episodes feel similar to that.  Pain is in the center of patient's chest, worse with deep breathing, nonradiating, not associated with diaphoresis, nausea, dizziness, or other symptoms when it onsets apart from the palpitations mentioned above. Patient does have a history of intellectual disability and thus does have a legal guardian who is his mom who he lives with.  I contacted her as well who confirms that this is why patient was brought to the hospital today as well as concern for his blood pressure.  Mom states that she did not notice that patient's losartan was never delivered and thus he has been off of it for an undetermined amount of time.  She states that she has contacted the pharmacy and it is set to be delivered tomorrow so that he can resume it.  Patient notes some episodes of a generalized bilateral frontal headache but otherwise denies fever, chills, nausea, vomiting, diarrhea, abdominal pain, focal weakness, dizziness, lightheadedness, syncope, or other concerns at this time.       Home Medications Prior to Admission medications   Medication Sig Start Date End Date Taking? Authorizing Provider  amantadine (SYMMETREL) 100 MG capsule Take 100 mg by mouth 2 (two) times daily. 09/25/21   [provider]  amLODipine (NORVASC) 5 MG tablet Take 5 mg by mouth in the morning. 03/17/22   [provider]  apixaban (ELIQUIS) 5 MG TABS tablet Take 1 tablet (5 mg total) by mouth 2 (two) times daily. 11/01/21   Clapacs, Madie Reno, MD  benztropine (COGENTIN) 1 MG tablet Take 1 tablet (1 mg total) by mouth 2 (two) times daily. 11/01/21   Clapacs, Madie Reno, MD  divalproex (DEPAKOTE) 500 MG DR tablet Take 1 tablet (500 mg total) by mouth every 12 (twelve) hours. 11/01/21   Clapacs, Madie Reno, MD  docusate sodium (COLACE) 100 MG capsule Take 1 capsule (100 mg total) by mouth 2 (two) times daily. 11/01/21   Clapacs, Madie Reno, MD  DULCOLAX 5 MG EC tablet Take 5 mg by mouth at bedtime.    [provider]  haloperidol (HALDOL) 10 MG tablet Take 1 tablet (10 mg total) by mouth 2 (two) times daily. 11/01/21   Clapacs, Madie Reno, MD  hydrOXYzine (ATARAX) 25 MG tablet Take 25 mg by mouth 3 (three) times daily.    [provider]  loratadine (CLARITIN) 10 MG tablet Take 1 tablet (10 mg total) by mouth daily as needed for allergies. 11/01/21   Clapacs, Madie Reno, MD  LORazepam (ATIVAN) 0.5 MG tablet Take 0.5 mg by mouth 2 (two) times daily as needed for anxiety or sedation.    [provider]  losartan (COZAAR) 100 MG tablet Take 100 mg by mouth daily. 03/17/22   [provider]  losartan (COZAAR) 50 MG tablet Take 1 tablet (50 mg total) by mouth 2 (two) times daily.  11/01/21   Clapacs, Madie Reno, MD  LUMIGAN 0.01 % SOLN Place 1 drop into both eyes at bedtime. 11/01/21   Clapacs, Madie Reno, MD  NICORETTE 2 MG gum Take 2 mg by mouth as needed for smoking cessation.    [provider]  nicotine (NICODERM CQ - DOSED IN MG/24 HOURS) 14 mg/24hr patch Place 1 patch (14 mg total) onto the skin daily. 11/01/21   Clapacs, Madie Reno, MD  ocrelizumab (OCREVUS) 300 MG/10ML injection Inject 2 vials ( 20 MLs) into the vein every 6 months 07/31/22   Sater, Nanine Means, MD  OLANZapine (ZYPREXA) 15 MG tablet Take 1 tablet (15 mg total) by mouth at bedtime. 11/01/21   Clapacs, Madie Reno, MD  paliperidone (INVEGA) 9 MG  24 hr tablet Take 9 mg by mouth in the morning.    [provider]  temazepam (RESTORIL) 22.5 MG capsule Take 22.5 mg by mouth at bedtime as needed for sleep.    [provider]  topiramate (TOPAMAX) 25 MG tablet TAKE ONE TABLET BY MOUTH TWICE A DAY 01/23/22   Suzzanne Cloud, NP  gabapentin (NEURONTIN) 400 MG capsule Take 400 mg by mouth 3 (three) times daily. 12/26/20 01/14/21  [provider]  propranolol (INDERAL) 10 MG tablet Take 1 tablet (10 mg total) by mouth 2 (two) times daily. 05/03/15 01/03/20  Patrecia Pour, NP      Allergies    Divalproex sodium    Review of Systems   Review of Systems  Constitutional:  Negative for activity change, appetite change, chills, diaphoresis, fatigue and fever.  HENT:  Negative for congestion, ear pain and sore throat.   Eyes:  Negative for pain and visual disturbance.  Respiratory:  Positive for chest tightness. Negative for apnea, cough, choking, shortness of breath, wheezing and stridor.   Cardiovascular:  Positive for chest pain and palpitations. Negative for leg swelling.  Gastrointestinal:  Negative for abdominal pain, blood in stool, constipation, diarrhea, nausea and vomiting.  Genitourinary:  Negative for dysuria and hematuria.  Musculoskeletal:  Negative for arthralgias and back pain.  Skin:  Negative for color change and rash.  Neurological:  Positive for headaches. Negative for dizziness, seizures, syncope, speech difficulty, weakness, light-headedness and numbness.  Psychiatric/Behavioral:  Negative for confusion.   All other systems reviewed and are negative. ROS above as reviewed with patient and mother, however, is important to note that this may be limited due to patient's intellectual disability.  Physical Exam Updated Vital Signs BP (!) 146/107   Pulse 84   Temp 97.6 F (36.4 C) (Oral)   Resp 17   Ht 6' 1"$  (1.854 m)   Wt 127 kg   SpO2 100%   BMI 36.94 kg/m  Physical Exam Vitals and nursing note  reviewed.  Constitutional:      General: He is not in acute distress.    Appearance: Normal appearance. He is not ill-appearing, toxic-appearing or diaphoretic.  HENT:     Head: Normocephalic and atraumatic.     Nose: Nose normal. No congestion.     Mouth/Throat:     Mouth: Mucous membranes are moist.     Pharynx: Oropharynx is clear. No oropharyngeal exudate or posterior oropharyngeal erythema.  Eyes:     General: No scleral icterus.    Conjunctiva/sclera: Conjunctivae normal.  Cardiovascular:     Rate and Rhythm: Normal rate and regular rhythm.     Heart sounds: Normal heart sounds. No murmur heard.    No gallop.  Pulmonary:     Effort: Pulmonary effort is normal. No respiratory distress.     Breath sounds: Normal breath sounds. No stridor. No wheezing, rhonchi or rales.  Chest:     Chest wall: No tenderness.  Abdominal:     General: Abdomen is flat. There is no distension.     Palpations: Abdomen is soft.     Tenderness: There is no abdominal tenderness. There is no right CVA tenderness, left CVA tenderness, guarding or rebound.  Musculoskeletal:        General: Normal range of motion.     Cervical back: Normal range of motion and neck supple. No rigidity.     Right lower leg: No edema.     Left lower leg: No edema.  Skin:    General: Skin is warm and dry.     Capillary Refill: Capillary refill takes less than 2 seconds.     Coloration: Skin is not jaundiced or pale.  Neurological:     General: No focal deficit present.     Mental Status: He is alert and oriented to person, place, and time. Mental status is at baseline.  Psychiatric:        Mood and Affect: Mood normal.        Behavior: Behavior normal.     ED Results / Procedures / Treatments   Labs (all labs ordered are listed, but only abnormal results are displayed) Labs Reviewed  CBC - Abnormal; Notable for the following components:      Result Value   Hemoglobin 12.4 (*)    All other components within  normal limits  RESP PANEL BY RT-PCR (RSV, FLU A&B, COVID)  [id number]  BASIC METABOLIC PANEL  TROPONIN I (HIGH SENSITIVITY)  TROPONIN I (HIGH SENSITIVITY)    EKG NSR at 83 bpm, no acute ST-T changes, intervals WNL  Radiology DG Chest 2 View  Result Date: 08/12/2022 CLINICAL DATA:  Chest pain EXAM: CHEST - 2 VIEW COMPARISON:  Chest radiograph June 23, 2022. FINDINGS: Lateral view is limited by patient positioning and habitus. The heart size and mediastinal contours are within normal limits. No focal airspace consolidation. No pleural effusion. No pneumothorax. The visualized skeletal structures are unremarkable. IMPRESSION: No acute cardiopulmonary disease. Electronically Signed   By: Dahlia Bailiff M.D.   On: 08/12/2022 18:14    Procedures Procedures    Medications Ordered in ED Medications  losartan (COZAAR) tablet 100 mg (100 mg Oral Given 08/12/22 1819)    ED Course/ Medical Decision Making/ A&P             HEART Score: 1                Medical Decision Making Amount and/or Complexity of Data Reviewed Labs: ordered. Decision-making details documented in ED Course. Radiology: ordered. Decision-making details documented in ED Course. ECG/medicine tests: ordered. Decision-making details documented in ED Course.   Medical Decision Making:   WIRT HORNADAY is a 32 y.o. male who presented to the ED today with chest pain as detailed above.    Patient's presentation is complicated by their history of intellectual disability.  Complete initial physical exam performed, notably the patient had a normal physical exam.    Reviewed and confirmed nursing documentation for past medical history, family history, social history.    Initial Assessment:   With the patient's presentation of chest pain, most likely diagnosis is decompensated anxiety. Other diagnoses were considered including (but not limited to) ACS, pneumonia, arrhythmia, electrolyte disturbance,  anemia, viral illness. These  are considered less likely due to history of present illness and physical exam findings.     Initial Plan:   Screening labs including CBC and Metabolic panel to evaluate for infectious or metabolic etiology of disease.  CXR to evaluate for structural/infectious intrathoracic pathology.  EKG to evaluate for cardiac pathology Cardiac enzymes to evaluate for ACS Viral swabs Objective evaluation as reviewed   Initial Study Results:   Laboratory  All laboratory results reviewed without evidence of clinically relevant pathology.    EKG EKG was reviewed independently. Rate, rhythm, axis, intervals all examined and without medically relevant abnormality. ST segments without concerns for elevations.    Radiology:  All images reviewed independently. Agree with radiology report at this time.   DG Chest 2 View  Result Date: 08/12/2022 CLINICAL DATA:  Chest pain EXAM: CHEST - 2 VIEW COMPARISON:  Chest radiograph June 23, 2022. FINDINGS: Lateral view is limited by patient positioning and habitus. The heart size and mediastinal contours are within normal limits. No focal airspace consolidation. No pleural effusion. No pneumothorax. The visualized skeletal structures are unremarkable. IMPRESSION: No acute cardiopulmonary disease. Electronically Signed   By: Dahlia Bailiff M.D.   On: 08/12/2022 18:14      Consults: Case discussed with legal guardian, pt's mother Hemi Schulenburg). She provided further history, aware of plan to obtain workup, and when workup resulted aware of disposition and plan to discharge pt home.   Final Assessment and Plan:   This is a 32 year old male presenting to ED for chest tightness and palpitations. He states he has symptoms a few times per day and they are similar to previous episodes of anxiety. History low suspicion for ACS, however, with intellectual disability limiting pt's history proceeded with chest pain workup for further evaluation. This workup was unremarkable  with normal troponin x 2, CXR, EKG, and remainder of labs unremarkable as well. Pt remained stable in ED with stable vital signs and reassuring physical exam. Tolerating PO, mental status appropriate. Discussed findings with mother, plan to discharge home, and recommendation for close follow up with PCP for better management of anxiety and/or if he continues to have problems with chest pain. Mom expressed understanding of plan, ED return precautions given, pt stable for discharge.     Clinical Impression:  1. Other chest pain   2. Anxiety   3. Panic attacks      Discharge           Final Clinical Impression(s) / ED Diagnoses Final diagnoses:  Other chest pain  Anxiety  Panic attacks    Rx / DC Orders ED Discharge Orders     None         Suzzette Righter, PA-C 08/12/22 2227    Audley Hose, MD 08/15/22 [phone]

## 2022-08-12 NOTE — Discharge Instructions (Signed)
Thank you for letting us take care of you today.  Your workup was reassuring with no signs of damage to your heart on the blood work, EKG, or chest x-ray. Your exam and the remainder of your labs were reassuring as well.   I am concerned your recurrent chest pain is related to your anxiety not being well controlled. I recommend you follow up with your PCP in the next few days to discuss your visit today and episodes of chest pain. They may want to put you on different medications or refer you to a cardiologist or psychiatrist for further evaluation.   If you develop worsening symptoms or other concerns, please be re-evaluated in nearest emergency department.

## 2022-08-15 ENCOUNTER — Non-Acute Institutional Stay (HOSPITAL_COMMUNITY)
Admission: RE | Admit: 2022-08-15 | Discharge: 2022-08-15 | Disposition: A | Discharge status: Home / Self Care | Payer: Medicare Other | Source: Ambulatory Visit | Attending: Internal Medicine | Admitting: Internal Medicine

## 2022-08-15 DIAGNOSIS — Z79899 Other long term (current) drug therapy: Secondary | ICD-10-CM

## 2022-08-15 DIAGNOSIS — G35 Multiple sclerosis: Secondary | ICD-10-CM | POA: Diagnosis not present

## 2022-08-15 MED ORDER — ACETAMINOPHEN 325 MG PO TABS
650.0000 mg | ORAL_TABLET | Freq: Once | ORAL | Status: AC
  Administered 2022-08-15: 650 mg via ORAL
  Filled 2022-08-15: qty 2

## 2022-08-15 MED ORDER — SODIUM CHLORIDE 0.9 % IV SOLN: INTRAVENOUS | Status: DC | PRN

## 2022-08-15 MED ORDER — DIPHENHYDRAMINE HCL 50 MG/ML IJ SOLN
50.0000 mg | Freq: Once | INTRAMUSCULAR | Status: AC
  Administered 2022-08-15: 50 mg via INTRAVENOUS
  Filled 2022-08-15: qty 1

## 2022-08-15 MED ORDER — SODIUM CHLORIDE 0.9 % IV SOLN
600.0000 mg | Freq: Once | INTRAVENOUS | Status: AC
  Administered 2022-08-15: 600 mg via INTRAVENOUS
  Filled 2022-08-15: qty 20

## 2022-08-15 MED ORDER — METHYLPREDNISOLONE SODIUM SUCC 125 MG IJ SOLR
125.0000 mg | Freq: Once | INTRAMUSCULAR | Status: AC
  Administered 2022-08-15: 125 mg via INTRAVENOUS
  Filled 2022-08-15: qty 2

## 2022-08-15 MED ORDER — FAMOTIDINE IN NACL 20-0.9 MG/50ML-% IV SOLN
20.0000 mg | Freq: Once | INTRAVENOUS | Status: AC
  Administered 2022-08-15: 20 mg via INTRAVENOUS
  Filled 2022-08-15: qty 50

## 2022-08-15 NOTE — Progress Notes (Signed)
PATIENT CARE CENTER NOTE     Diagnosis:  Multiple sclerosis ([place]) (G35)     Provider: Arlice Colt, MD     Procedure: Ocrevus 600 mg IV      Note: Patient received Ocrevus 600 mg infusion via PIV.  Pre-medications (Tylenol, IV Benadryl, IV Solumedrol and Pepcid IVPB) given per order and infusion titrated per protocol. Patient tolerated infusion well.  Mother at bedside throughout infusion. Vital signs stable. AVS offered but patient refused. Patient alert, oriented and ambulatory at discharge. Discharged home with mother.

## 2022-08-31 ENCOUNTER — Other Ambulatory Visit: Payer: Self-pay

## 2022-08-31 ENCOUNTER — Emergency Department (HOSPITAL_COMMUNITY)
Admission: EM | Admit: 2022-08-31 | Discharge: 2022-09-09 | Disposition: A | Discharge status: Home / Self Care | Payer: Medicare Other | Attending: Student | Admitting: Student

## 2022-08-31 DIAGNOSIS — F919 Conduct disorder, unspecified: Secondary | ICD-10-CM | POA: Diagnosis present

## 2022-08-31 DIAGNOSIS — Z7901 Long term (current) use of anticoagulants: Secondary | ICD-10-CM | POA: Insufficient documentation

## 2022-08-31 DIAGNOSIS — I10 Essential (primary) hypertension: Secondary | ICD-10-CM | POA: Insufficient documentation

## 2022-08-31 DIAGNOSIS — Z79899 Other long term (current) drug therapy: Secondary | ICD-10-CM | POA: Diagnosis not present

## 2022-08-31 DIAGNOSIS — F201 Disorganized schizophrenia: Secondary | ICD-10-CM | POA: Diagnosis not present

## 2022-08-31 DIAGNOSIS — F17210 Nicotine dependence, cigarettes, uncomplicated: Secondary | ICD-10-CM | POA: Insufficient documentation

## 2022-08-31 DIAGNOSIS — Z20822 Contact with and (suspected) exposure to covid-19: Secondary | ICD-10-CM | POA: Diagnosis not present

## 2022-08-31 DIAGNOSIS — R440 Auditory hallucinations: Secondary | ICD-10-CM

## 2022-08-31 LAB — CBC
HCT: 41.9 % (ref 39.0–52.0)
Hemoglobin: 12.8 g/dL — ABNORMAL LOW (ref 13.0–17.0)
MCH: 25.7 pg — ABNORMAL LOW (ref 26.0–34.0)
MCHC: 30.5 g/dL (ref 30.0–36.0)
MCV: 84.1 fL (ref 80.0–100.0)
Platelets: 181 10*3/uL (ref 150–400)
RBC: 4.98 MIL/uL (ref 4.22–5.81)
RDW: 14.6 % (ref 11.5–15.5)
WBC: 7.5 10*3/uL (ref 4.0–10.5)
nRBC: 0 % (ref 0.0–0.2)

## 2022-08-31 LAB — COMPREHENSIVE METABOLIC PANEL
ALT: 23 U/L (ref 0–44)
AST: 13 U/L — ABNORMAL LOW (ref 15–41)
Albumin: 4.5 g/dL (ref 3.5–5.0)
Alkaline Phosphatase: 60 U/L (ref 38–126)
Anion gap: 10 (ref 5–15)
BUN: 18 mg/dL (ref 6–20)
CO2: 25 mmol/L (ref 22–32)
Calcium: 9.2 mg/dL (ref 8.9–10.3)
Chloride: 106 mmol/L (ref 98–111)
Creatinine, Ser: 1.23 mg/dL (ref 0.61–1.24)
GFR, Estimated: >60 mL/min (ref >60)
Glucose, Bld: 101 mg/dL — ABNORMAL HIGH (ref 70–99)
Potassium: 3.8 mmol/L (ref 3.5–5.1)
Sodium: 141 mmol/L (ref 135–145)
Total Bilirubin: 0.5 mg/dL (ref 0.3–1.2)
Total Protein: 6.9 g/dL (ref 6.5–8.1)

## 2022-08-31 LAB — ACETAMINOPHEN LEVEL: Acetaminophen (Tylenol), Serum: <10 ug/mL — ABNORMAL LOW (ref 10–30)

## 2022-08-31 LAB — ETHANOL: Alcohol, Ethyl (B): <10 mg/dL (ref <10)

## 2022-08-31 LAB — SALICYLATE LEVEL: Salicylate Lvl: <7 mg/dL — ABNORMAL LOW (ref 7.0–30.0)

## 2022-08-31 NOTE — ED Triage Notes (Signed)
Pt BIB Grandmother Louise d/t concerns for hallucinations, SI, and HI ongoing since Thursday worsening today. Grandmother states pt has been hearing things and seeing thing. Noted talking to the walls and laughing. Has been doing shooting noises to people, threatening, and beating himself up. Decreased sleep. Grandmother denies concern for ETOH/drug use. Hx Bipolar 1 and schizophrenia.   Legal Guardian, [name] called with no answer

## 2022-08-31 NOTE — ED Notes (Signed)
No purple scrubs available in pts size

## 2022-09-01 ENCOUNTER — Other Ambulatory Visit (HOSPITAL_COMMUNITY): Payer: Self-pay

## 2022-09-01 DIAGNOSIS — F201 Disorganized schizophrenia: Secondary | ICD-10-CM

## 2022-09-01 DIAGNOSIS — F259 Schizoaffective disorder, unspecified: Secondary | ICD-10-CM

## 2022-09-01 LAB — RAPID URINE DRUG SCREEN, HOSP PERFORMED
Amphetamines: NOT DETECTED
Barbiturates: NOT DETECTED
Benzodiazepines: NOT DETECTED
Cocaine: NOT DETECTED
Opiates: NOT DETECTED
Tetrahydrocannabinol: NOT DETECTED

## 2022-09-01 LAB — RESP PANEL BY RT-PCR (RSV, FLU A&B, COVID)  RVPGX2
Influenza A by PCR: NEGATIVE
Influenza B by PCR: NEGATIVE
Resp Syncytial Virus by PCR: NEGATIVE
SARS Coronavirus 2 by RT PCR: NEGATIVE

## 2022-09-01 LAB — VALPROIC ACID LEVEL: Valproic Acid Lvl: 46 ug/mL — ABNORMAL LOW (ref 50.0–100.0)

## 2022-09-01 MED ORDER — AMANTADINE HCL 100 MG PO CAPS
100.0000 mg | ORAL_CAPSULE | Freq: Two times a day (BID) | ORAL | Status: DC
  Administered 2022-09-01 – 2022-09-09 (×18): 100 mg via ORAL
  Filled 2022-09-01 (×18): qty 1

## 2022-09-01 MED ORDER — TOPIRAMATE 25 MG PO TABS
25.0000 mg | ORAL_TABLET | Freq: Two times a day (BID) | ORAL | Status: DC
  Administered 2022-09-01 (×2): 25 mg via ORAL
  Filled 2022-09-01 (×2): qty 1

## 2022-09-01 MED ORDER — LOSARTAN POTASSIUM 25 MG PO TABS
50.0000 mg | ORAL_TABLET | Freq: Two times a day (BID) | ORAL | Status: DC
  Administered 2022-09-01 – 2022-09-09 (×17): 50 mg via ORAL
  Filled 2022-09-01 (×19): qty 1

## 2022-09-01 MED ORDER — AMLODIPINE BESYLATE 5 MG PO TABS
5.0000 mg | ORAL_TABLET | Freq: Every morning | ORAL | Status: DC
  Administered 2022-09-01 – 2022-09-09 (×7): 5 mg via ORAL
  Filled 2022-09-01 (×8): qty 1

## 2022-09-01 MED ORDER — TEMAZEPAM 7.5 MG PO CAPS: 22.5000 mg | ORAL_CAPSULE | Freq: Every evening | ORAL | Status: DC | PRN

## 2022-09-01 MED ORDER — TEMAZEPAM 7.5 MG PO CAPS
7.5000 mg | ORAL_CAPSULE | Freq: Every evening | ORAL | Status: DC | PRN
  Administered 2022-09-02 – 2022-09-08 (×3): 7.5 mg via ORAL
  Filled 2022-09-01 (×3): qty 1

## 2022-09-01 MED ORDER — NICOTINE 14 MG/24HR TD PT24
14.0000 mg | MEDICATED_PATCH | Freq: Every day | TRANSDERMAL | Status: DC
  Administered 2022-09-03 – 2022-09-09 (×6): 14 mg via TRANSDERMAL
  Filled 2022-09-01 (×7): qty 1

## 2022-09-01 MED ORDER — HALOPERIDOL 5 MG PO TABS
10.0000 mg | ORAL_TABLET | Freq: Two times a day (BID) | ORAL | Status: DC
  Administered 2022-09-01 – 2022-09-04 (×9): 10 mg via ORAL
  Filled 2022-09-01 (×10): qty 2

## 2022-09-01 MED ORDER — NICOTINE POLACRILEX 2 MG MT GUM: 2.0000 mg | CHEWING_GUM | OROMUCOSAL | Status: DC | PRN

## 2022-09-01 MED ORDER — APIXABAN 5 MG PO TABS
5.0000 mg | ORAL_TABLET | Freq: Two times a day (BID) | ORAL | Status: DC
  Administered 2022-09-01 – 2022-09-09 (×17): 5 mg via ORAL
  Filled 2022-09-01 (×20): qty 1

## 2022-09-01 MED ORDER — BENZTROPINE MESYLATE 1 MG PO TABS
1.0000 mg | ORAL_TABLET | Freq: Two times a day (BID) | ORAL | Status: DC
  Administered 2022-09-01 – 2022-09-02 (×4): 1 mg via ORAL
  Filled 2022-09-01 (×4): qty 1

## 2022-09-01 MED ORDER — DIVALPROEX SODIUM 500 MG PO DR TAB
500.0000 mg | DELAYED_RELEASE_TABLET | Freq: Two times a day (BID) | ORAL | Status: DC
  Administered 2022-09-01 – 2022-09-06 (×11): 500 mg via ORAL
  Filled 2022-09-01 (×11): qty 1

## 2022-09-01 NOTE — ED Notes (Signed)
Patient alert this shift. Rambling and mumbled speech.   Disorganized. Responding to internal stimuli. Patient laughing inappropriately.  Randomly calling out. Patient has been incontinent this shift. Patient can ambulate. Patient has limited self awareness or safety awareness.
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
## 2022-09-01 NOTE — Consult Note (Signed)
Brief Psychiatry Consult Note  In chart review preparing for 9 AM meeting, noted pt prescribed depakote but no level drawn. Added random level onto labs collected upon presentation to ED as this may aid in further management (pt possibly underdosed at 1000 mg/day and recent wt). I did not see this pt and he still needs to be evaluated by TTS team.   - vpa level added onto prev collection - to be seen by TTS team today 2/26 - no charge  Joycelyn Schmid A Devra Stare

## 2022-09-01 NOTE — ED Notes (Signed)
Pt has two belongings bags in locker 31 located in [place]

## 2022-09-01 NOTE — Progress Notes (Signed)
LCSW Progress Note  [id number]   Brandon [name]  09/01/2022  1:18 PM  Description:   Inpatient Psychiatric Referral  Patient was recommended inpatient per Charmaine Downs, NP. There are no available beds at [facility]. Patient was referred to the following facilities:   Destination  Service Provider Address Phone Fax  [address] [place] [address] [phone]  [facility]  Center, [place] [address] [place]  [facility]  [address] [place] [address] [phone] [place]  [place], [place] [address] [phone]  [facility]  [phone] [place] [address] [phone] [facility]  [address] [place], [place] [address] [phone] [place] [place] [address] [place]  [facility]  [address], [place] [address] [phone]  [place] [place] [address] [phone]  [facility]  [place] [address] [phone]  [facility]  [address] [place] [address] [phone] [facility]  [address] [place] [address] [phone] [facility]  [address], [place] [address] [place]  [address] [place] [address] [phone]  [facility]  [address] [place] [address] [place]  [facility]  [address] [place] [address] [phone]  [place]  [address], [place] [address] [phone]  [place] [place] [address] [phone]  [facility]  [address] [place] [address] [id number]  [facility]  [address] [place] [address] [phone]  [place], [place] [phone] [place]  [place], [place] [address] [phone] [place]  [address], [place] [address] [id number]    Situation ongoing, CSW to continue following and update chart as more information becomes available.      Denna Haggard, [place]  09/01/2022 1:18 PM

## 2022-09-01 NOTE — Consult Note (Cosign Needed Addendum)
[facility] ED ASSESSMENT   Reason for Consult:  Psychiatry evaluation. Referring Physician:  ER Physician Patient Identification: Brandon [name] MRN:  [id number] ED Chief Complaint: Schizophrenia, disorganized type [facility])  Diagnosis:  Principal Problem:   Schizophrenia, disorganized type [facility])   ED Assessment Time Calculation: Start Time: 1228 Stop Time: 1257 Total Time in Minutes (Assessment Completion): 29   Subjective:   Brandon [name] is a 32 y.o. male patient admitted with previous hx of Disorganized Schizophrenia and Bipolar disorder brought in by his grandmother for Auditory hallucination and suicide ideation. Patient was drowsy this morning and was unable to fully participate in the evaluation.  HPI:  Patient was seen in bed sleeping and was awoken by this provider.  Patient received Restoril 22.5 mg last night for sleep.  Patient earlier was incontinent of urine and soaked up his bed and gown in urine and was cleaned up by staff.  Patient reports that he has been hearing voices telling him to leave his grandmother's home to walk the street.   He is seeing things but unable to state what he is seeing.  His speech is slow and garbled and his answers are yes and no.  During this assessment he speech was word salad. Patient could not state what plans he has for suicide but is suicidal.  In the ER patient is heard by Nursing staff yelling at unseen People, laughing and talking responding to internal stimuli. Patient is disorganized and confused and meets criteria for inpatient hospitalization. Collateral from mom, [name]  Sen  who is also his legal Guardian is that patient has been having auditory hallucinations at home.  He laughs, talks and responds to hallucination the voices.  Some times he gets frustrated he starts hitting his head to avoid hearing voices. Mother reports patient does not sleep at night, he is constantly moving around in the house all night.  Occasionally patient is making  shooting noises as if he is shooting at something or some body.  Mother reports medication compliance and she dispenses medications to him. Patient meets criteria for inpatient hospitalization for stabilization.  His home Medications are resumed.  Because he has been drowsy and incontinent of urine due to Restoril same is decreased to 7.5 mg at night.  Depakote is resumed since mother vehemently denied allergy to same.  Mother states Depakote controls his mood but makes him Hypersalivate.  We will seek inpatient hospitalization and will fax out records to facilities that has available beds.  Past Psychiatric History: Schizophrenia, Bipolar disorder.  Multiple inpatient psychiatry hospitalizations and multiple ER visit.  Out patient Provider is DR [name], Psychiatric.  Risk to Self or Others: Is the patient at risk to self? Yes Has the patient been a risk to self in the past 6 months? Yes Has the patient been a risk to self within the distant past? Yes Is the patient a risk to others? No Has the patient been a risk to others in the past 6 months? No Has the patient been a risk to others within the distant past? No  [place] Scale:  [place] ED from 08/31/2022 in [facility] Emergency Department at [facility] ED from 08/12/2022 in [facility] Emergency Department at [facility] ED from 08/04/2022 in [facility] Risk Error: Q3, 4, or 5 should not be populated when Q2 is No Error: Question 6 not populated       AIMS:  , , ,  ,  ASAM:    Substance Abuse:     Past Medical History:  Past Medical History:  Diagnosis Date   ADHD (attention deficit hyperactivity disorder)    Bipolar 1 disorder (HCC)    Chronic back pain    Chronic constipation    Chronic neck pain    Hypertension    Multiple sclerosis ([place]) 05/20/2013   left sided weakness, dysarthria   Non-compliance    Obesity    Pulmonary embolism (HCC)     Schizophrenia (HCC)    Stroke ([place])    left sided deficits - pt's mother denies this    Past Surgical History:  Procedure Laterality Date   NO PAST SURGERIES     None     RADIOLOGY WITH ANESTHESIA N/A 01/16/2021   Procedure: MRI WITH ANESTHESIA CERVICAL AND THORASIC SPINE WITH AND WITHOUT CONTRAST;  Surgeon: Radiologist, Medication, MD;  Location: [place];  Service: Radiology;  Laterality: N/A;   TOOTH EXTRACTION N/A 06/24/2019   Procedure: DENTAL RESTORATION/EXTRACTION OF TEETH NUMBER ONE, SIXTEEN, SEVENTEEN, NINETEEN, THIRTY-TWO;  Surgeon: Diona Browner, DDS;  Location: [place];  Service: Oral Surgery;  Laterality: N/A;   Family History:  Family History  Problem Relation Age of Onset   Diabetes Mother    ADD / ADHD Brother    Family Psychiatric  History: ADHD-Brother Social History:  Social History   Substance and Sexual Activity  Alcohol Use Not Currently   Alcohol/week: 0.0 standard drinks of alcohol   Comment: "A little bit"      Social History   Substance and Sexual Activity  Drug Use Not Currently   Types: Marijuana   Comment: Last used: unknown     Social History   Socioeconomic History   Marital status: Single    Spouse name: Not on file   Number of children: 0   Years of education: 11th   Highest education level: Not on file  Occupational History   Occupation: unemployed    Employer: [employer]    Comment: Disbaled  Tobacco Use   Smoking status: Every Day    Packs/day: 0.25    Types: Cigarettes   Smokeless tobacco: Never   Tobacco comments:    2 cigarettes a day  Vaping Use   Vaping Use: Never used  Substance and Sexual Activity   Alcohol use: Not Currently    Alcohol/week: 0.0 standard drinks of alcohol    Comment: "A little bit"    Drug use: Not Currently    Types: Marijuana    Comment: Last used: unknown    Sexual activity: Not on file  Other Topics Concern   Not on file  Social History Narrative   Patient lives at home with his mother.    Disabled.   Education 11 th grade .   Right handed.   Drinks caffeine occassionally   Social Determinants of Radio broadcast assistant Strain: Not on file  Food Insecurity: Not on file  Transportation Needs: Not on file  Physical Activity: Not on file  Stress: Not on file  Social Connections: Not on file   Additional Social History:    Allergies:   Allergies  Allergen Reactions   Divalproex Sodium Other (See Comments)    Causes excessive salivation/unintentional drooling    Labs:  Results for orders placed or performed during the hospital encounter of 08/31/22 (from the past 48 hour(s))  Resp panel by RT-PCR (RSV, Flu A&B, Covid) Anterior Nasal Swab     Status: None  Collection Time: 08/31/22 10:12 PM   Specimen: Anterior Nasal Swab  Result Value Ref Range   SARS Coronavirus 2 by RT PCR NEGATIVE NEGATIVE    Comment: (NOTE) SARS-CoV-2 target nucleic acids are NOT DETECTED.  The SARS-CoV-2 RNA is generally detectable in upper respiratory specimens during the acute phase of infection. The lowest concentration of SARS-CoV-2 viral copies this assay can detect is 138 copies/mL. A negative result does not preclude SARS-Cov-2 infection and should not be used as the sole basis for treatment or other patient management decisions. A negative result may occur with  improper specimen collection/handling, submission of specimen other than nasopharyngeal swab, presence of viral mutation(s) within the areas targeted by this assay, and inadequate number of viral copies(<138 copies/mL). A negative result must be combined with clinical observations, patient history, and epidemiological information. The expected result is Negative.  Fact Sheet for Patients:  EntrepreneurPulse.com.au  Fact Sheet for Healthcare Providers:  IncredibleEmployment.be  This test is no t yet approved or cleared by the [place] FDA and  has been authorized for  detection and/or diagnosis of SARS-CoV-2 by FDA under an Emergency Use Authorization (EUA). This EUA will remain  in effect (meaning this test can be used) for the duration of the COVID-19 declaration under Section 564(b)(1) of the Act, 21 U.S.C.section 360bbb-3(b)(1), unless the authorization is terminated  or revoked sooner.       Influenza A by PCR NEGATIVE NEGATIVE   Influenza B by PCR NEGATIVE NEGATIVE    Comment: (NOTE) The Xpert Xpress SARS-CoV-2/FLU/RSV plus assay is intended as an aid in the diagnosis of influenza from Nasopharyngeal swab specimens and should not be used as a sole basis for treatment. Nasal washings and aspirates are unacceptable for Xpert Xpress SARS-CoV-2/FLU/RSV testing.  Fact Sheet for Patients: EntrepreneurPulse.com.au  Fact Sheet for Healthcare Providers: IncredibleEmployment.be  This test is not yet approved or cleared by the [place] FDA and has been authorized for detection and/or diagnosis of SARS-CoV-2 by FDA under an Emergency Use Authorization (EUA). This EUA will remain in effect (meaning this test can be used) for the duration of the COVID-19 declaration under Section 564(b)(1) of the Act, 21 U.S.C. section 360bbb-3(b)(1), unless the authorization is terminated or revoked.     Resp Syncytial Virus by PCR NEGATIVE NEGATIVE    Comment: (NOTE) Fact Sheet for Patients: EntrepreneurPulse.com.au  Fact Sheet for Healthcare Providers: IncredibleEmployment.be  This test is not yet approved or cleared by the [place] FDA and has been authorized for detection and/or diagnosis of SARS-CoV-2 by FDA under an Emergency Use Authorization (EUA). This EUA will remain in effect (meaning this test can be used) for the duration of the COVID-19 declaration under Section 564(b)(1) of the Act, 21 U.S.C. section 360bbb-3(b)(1), unless the authorization is terminated  or revoked.  Performed at [facility], [place] [address] [place], [place] [address]   Comprehensive metabolic panel     Status: Abnormal   Collection Time: 08/31/22 10:32 PM  Result Value Ref Range   Sodium 141 135 - 145 mmol/L   Potassium 3.8 3.5 - 5.1 mmol/L   Chloride 106 98 - 111 mmol/L   CO2 25 22 - 32 mmol/L   Glucose, Bld 101 (H) 70 - 99 mg/dL    Comment: Glucose reference range applies only to samples taken after fasting for at least 8 hours.   BUN 18 6 - 20 mg/dL   Creatinine, Ser 1.23 0.61 - 1.24 mg/dL   Calcium 9.2 8.9 -  10.3 mg/dL   Total Protein 6.9 6.5 - 8.1 g/dL   Albumin 4.5 3.5 - 5.0 g/dL   AST 13 (L) 15 - 41 U/L   ALT 23 0 - 44 U/L   Alkaline Phosphatase 60 38 - 126 U/L   Total Bilirubin 0.5 0.3 - 1.2 mg/dL   GFR, Estimated >60 >60 mL/min    Comment: (NOTE) Calculated using the CKD-EPI Creatinine Equation (2021)    Anion gap 10 5 - 15    Comment: Performed at [facility], [place] [address], [place], [place] [address]  cbc     Status: Abnormal   Collection Time: 08/31/22 10:32 PM  Result Value Ref Range   WBC 7.5 4.0 - 10.5 K/uL   RBC 4.98 4.22 - 5.81 MIL/uL   Hemoglobin 12.8 (L) 13.0 - 17.0 g/dL   HCT 41.9 39.0 - 52.0 %   MCV 84.1 80.0 - 100.0 fL   MCH 25.7 (L) 26.0 - 34.0 pg   MCHC 30.5 30.0 - 36.0 g/dL   RDW 14.6 11.5 - 15.5 %   Platelets 181 150 - 400 K/uL   nRBC 0.0 0.0 - 0.2 %    Comment: Performed at [facility], [place] [address] [place], [place] [address]  Ethanol     Status: None   Collection Time: 08/31/22 10:52 PM  Result Value Ref Range   Alcohol, Ethyl (B) <10 <10 mg/dL    Comment: (NOTE) Lowest detectable limit for serum alcohol is 10 mg/dL.  For medical purposes only. Performed at [facility], [place] [address] [place], [place] [id number]   Salicylate level     Status: Abnormal   Collection Time: 08/31/22 10:52 PM  Result Value Ref Range   Salicylate Lvl  Q000111Q (L) 7.0 - 30.0 mg/dL    Comment: Performed at [facility], [place] [address], [place], [place] [address]  Acetaminophen level     Status: Abnormal   Collection Time: 08/31/22 10:52 PM  Result Value Ref Range   Acetaminophen (Tylenol), Serum <10 (L) 10 - 30 ug/mL    Comment: (NOTE) Therapeutic concentrations vary significantly. A range of 10-30 ug/mL  may be an effective concentration for many patients. However, some  are best treated at concentrations outside of this range. Acetaminophen concentrations >150 ug/mL at 4 hours after ingestion  and >50 ug/mL at 12 hours after ingestion are often associated with  toxic reactions.  Performed at [facility], [place] [address] [place], [place] [address]     Current Facility-Administered Medications  Medication Dose Route Frequency Provider Last Rate Last Admin   amantadine (SYMMETREL) capsule 100 mg  100 mg Oral BID Kommor, [place], MD   100 mg at 09/01/22 1029   amLODipine (NORVASC) tablet 5 mg  5 mg Oral q AM Kommor, [place], MD   5 mg at 09/01/22 1028   apixaban (ELIQUIS) tablet 5 mg  5 mg Oral BID Kommor, [place], MD   5 mg at 09/01/22 1028   benztropine (COGENTIN) tablet 1 mg  1 mg Oral BID Kommor, [place], MD   1 mg at 09/01/22 1029   divalproex (DEPAKOTE) DR tablet 500 mg  500 mg Oral Q12H Ramar Nobrega C, NP       haloperidol (HALDOL) tablet 10 mg  10 mg Oral BID Kommor, [place], MD   10 mg at 09/01/22 1028   losartan (COZAAR) tablet 50 mg  50 mg Oral BID Kommor, Debe Coder, MD  50 mg at 09/01/22 1029   nicotine (NICODERM CQ - dosed in mg/24 hours) patch 14 mg  14 mg Transdermal Daily Kommor, [place], MD       nicotine polacrilex (NICORETTE) gum 2 mg  2 mg Oral PRN Kommor, [place], MD       temazepam (RESTORIL) capsule 7.5 mg  7.5 mg Oral QHS PRN Delfin Gant, NP       Current Outpatient Medications  Medication Sig Dispense Refill   amantadine (SYMMETREL) 100 MG capsule Take 100 mg by  mouth 2 (two) times daily.     amLODipine (NORVASC) 5 MG tablet Take 5 mg by mouth in the morning.     apixaban (ELIQUIS) 5 MG TABS tablet Take 1 tablet (5 mg total) by mouth 2 (two) times daily. 60 tablet 0   benztropine (COGENTIN) 1 MG tablet Take 1 tablet (1 mg total) by mouth 2 (two) times daily. 60 tablet 0   divalproex (DEPAKOTE) 500 MG DR tablet Take 1 tablet (500 mg total) by mouth every 12 (twelve) hours. 60 tablet 0   docusate sodium (COLACE) 100 MG capsule Take 1 capsule (100 mg total) by mouth 2 (two) times daily. 60 capsule 0   DULCOLAX 5 MG EC tablet Take 5 mg by mouth at bedtime.     haloperidol (HALDOL) 10 MG tablet Take 1 tablet (10 mg total) by mouth 2 (two) times daily. 60 tablet 0   hydrOXYzine (ATARAX) 25 MG tablet Take 25 mg by mouth 3 (three) times daily.     loratadine (CLARITIN) 10 MG tablet Take 1 tablet (10 mg total) by mouth daily as needed for allergies. 30 tablet 0   LORazepam (ATIVAN) 0.5 MG tablet Take 0.5 mg by mouth 2 (two) times daily as needed for anxiety or sedation.     losartan (COZAAR) 100 MG tablet Take 100 mg by mouth daily.     losartan (COZAAR) 50 MG tablet Take 1 tablet (50 mg total) by mouth 2 (two) times daily. 60 tablet 0   LUMIGAN 0.01 % SOLN Place 1 drop into both eyes at bedtime. 2.5 mL 0   NICORETTE 2 MG gum Take 2 mg by mouth as needed for smoking cessation.     nicotine (NICODERM CQ - DOSED IN MG/24 HOURS) 14 mg/24hr patch Place 1 patch (14 mg total) onto the skin daily. 28 patch 0   ocrelizumab (OCREVUS) 300 MG/10ML injection Inject 2 vials ( 20 MLs) into the vein every 6 months 20 mL 0   OLANZapine (ZYPREXA) 15 MG tablet Take 1 tablet (15 mg total) by mouth at bedtime. 30 tablet 0   paliperidone (INVEGA) 9 MG 24 hr tablet Take 9 mg by mouth in the morning.     temazepam (RESTORIL) 22.5 MG capsule Take 22.5 mg by mouth at bedtime as needed for sleep.     topiramate (TOPAMAX) 25 MG tablet TAKE ONE TABLET BY MOUTH TWICE A DAY 60 tablet 4     Musculoskeletal: Strength & Muscle Tone: within normal limits Gait & Station: normal Patient leans: Front   Psychiatric Specialty Exam: Presentation  General Appearance:  Casual; Disheveled; Other (comment) (morbidly obese)  Eye Contact: Fleeting  Speech: Garbled; Slow; Slurred  Speech Volume: Decreased (yes and no answers mostly)  Handedness: Right   Mood and Affect  Mood: Anxious  Affect: Congruent   Thought Process  Thought Processes: Disorganized; Irrevelant  Descriptions of Associations:Tangential  Orientation:Partial  Thought Content:Illogical  History of Schizophrenia/Schizoaffective disorder:Yes  Duration  of Psychotic Symptoms:Greater than six months  Hallucinations:Hallucinations: Auditory Description of Auditory Hallucinations: Voices are telling him to leave the house he shares with his mother.  Ideas of Reference:None  Suicidal Thoughts:Suicidal Thoughts: No  Homicidal Thoughts:Homicidal Thoughts: No   Sensorium  Memory: Immediate Poor; Recent Poor; Remote Poor  Judgment: Impaired  Insight: Fair   Community education officer  Concentration: Poor  Attention Span: Poor  Recall: [employer] of Knowledge: Fair  Language: Fair   Psychomotor Activity  Psychomotor Activity: Psychomotor Activity: Psychomotor Retardation   Assets  Assets: Housing    Sleep  Sleep: Sleep: Poor   Physical Exam: Physical Exam Vitals and nursing note reviewed.  Constitutional:      Appearance: He is obese.     Comments: Drowsy  HENT:     Head: Normocephalic and atraumatic.     Nose: Nose normal.  Cardiovascular:     Rate and Rhythm: Normal rate and regular rhythm.  Pulmonary:     Effort: Pulmonary effort is normal.  Musculoskeletal:        General: Normal range of motion.     Cervical back: Normal range of motion.  Skin:    General: Skin is warm and dry.  Neurological:     Mental Status: He is oriented to person, place,  and time.    Review of Systems  Constitutional: Negative.   HENT: Negative.    Eyes: Negative.   Respiratory: Negative.    Cardiovascular: Negative.   Gastrointestinal: Negative.   Genitourinary: Negative.   Musculoskeletal: Negative.   Skin: Negative.   Neurological: Negative.   Endo/Heme/Allergies: Negative.   Psychiatric/Behavioral:  Positive for hallucinations and suicidal ideas. The patient has insomnia.    Blood pressure (!) 146/106, pulse 74, temperature 97.6 F (36.4 C), temperature source Axillary, resp. rate 16, SpO2 99 %. There is no height or weight on file to calculate BMI.  Medical Decision Making: Patient meets criteria for inpatient hospitalization for stabilization.  His home Medications are resumed.  Because he has been drowsy and incontinent of urine due to Restoril same is decreased to 7.5 mg at night.  Depakote is resumed since mother vehemently denied allergy to same.  Mother states Depakote controls his mood but makes him Hypersalivate.  We will seek inpatient hospitalization and will fax out records to facilities that has available beds.  Problem 1: Disorganized Schizophrenia  Problem 2: Suicide ideation  Disposition:  Admit, seek bed placement  Delfin Gant, NP-PMHNP-BC 09/01/2022 1:09 PM

## 2022-09-02 DIAGNOSIS — F201 Disorganized schizophrenia: Secondary | ICD-10-CM | POA: Diagnosis not present

## 2022-09-02 NOTE — ED Provider Notes (Signed)
EKG today without any acute changes compared to previous EKGs.  No evidence of an acute cardiac event   Fredia Sorrow, MD 09/02/22 [phone]

## 2022-09-02 NOTE — Progress Notes (Signed)
LCSW Progress Note  [id number]   DUB SOLTANI  09/02/2022  9:46 AM  Description:   Inpatient Psychiatric Referral  Patient was recommended inpatient per Charmaine Downs, NP. There are no available beds at [facility]. Patient was referred to the following facilities:   Destination  Service Provider Address Phone Fax  [place] [place] [address] [phone]  [facility]  [place], [place] [address] [place]  [facility]  [address] [place] [address] [phone] [place]  [place], [place] [address] [phone]  [facility]  [phone] [place] [address] [phone] [facility]  [address] [place], [place] [address] [phone] [place] [address] [place] [address] [place]  [facility]  [address], [place] [address] [phone]  [place] [place] [address] [phone]  [facility]  [place] [address] [phone]  [facility]  [address] [place] [address] [phone] [facility]  [address] [place] [address] [phone] [facility]  [address], [place] [address] [facility]  [address] [place] [address] [phone]  [facility]  [address] [place] [address] [place]  [facility]  [address] [place] [address] [phone]  [place]  [address] [place] [address] [phone]  [place] [place] [address] [phone]  [facility]  [address] [place] [address] [id number]  [facility]  [address] [place] [address] [phone]  [place], [place] [phone] [place]  [place], [place] [address] [phone] [place]  [address] [place] [address] [place]  [address] [id number]    Situation ongoing, CSW to continue following and update chart as more information becomes available.      Herbie [place]  09/02/2022 9:46 AM

## 2022-09-02 NOTE — ED Provider Notes (Signed)
Emergency Medicine Observation Re-evaluation Note  Brandon [name] is a 32 y.o. male, seen on rounds today.  Pt initially presented to the ED for complaints of Hallucinations (SI/HI) Currently, the patient is sleeping.  Physical Exam  BP (!) 153/112 (BP Location: Right Arm)   Pulse 75   Temp 98 F (36.7 C) (Axillary)   Resp 18   SpO2 100%  Physical Exam General: NAD Lungs: bilateral chest rise Psych: resting comfortably  ED Course / MDM  EKG:   I have reviewed the labs performed to date as well as medications administered while in observation.  Recent changes in the last 24 hours include None.  Plan  Current plan is for Psych placement.    Deno Etienne, DO 09/02/22 0800

## 2022-09-02 NOTE — ED Notes (Signed)
Patient alert this shift.  Patient disorganized and rambling at times. Patient responding to internal stimuli. Patient laughing inappropriately. Patient randomly calling out and making loud noises.  Patient redirectable. Patent  medication compliant. Patient can ambulate. Patient limited safety awareness at this time.

## 2022-09-02 NOTE — Progress Notes (Signed)
[facility] Psych ED Progress Note  09/02/2022 3:12 PM [place] [name]  MRN:  [id number]   Subjective:  Br[place] [name] is a 32 y.o. male patient admitted with previous hx of Disorganized Schizophrenia and Bipolar disorder brought in by his grandmother for Auditory hallucination and suicide ideation. Patient was drowsy this morning and was unable to fully participate in the evaluation.  Patient was seen this morning in bed.  He gets up for shower with prompt from Nursing staff.  Patient appears to not hebe been taking his medications as prescribed.  Depakote level came back to 46 and he could not explain if he is taking his medications or not.  Patient remains Psychotic occasionally yelling out for no reason.  He steers looking at the wall or ceiling.  He occasionally is seen responding to unseen people.  Patient continues to require inpatient hospitalization.  We will continue to fax out records to facilities with available bed. 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[place]) Diagnosis:  Principal Problem:   Schizophrenia, disorganized type [facility])   ED Assessment Time Calculation: Start Time: 1436 Stop Time: 1451 Total Time in Minutes (Assessment Completion): 15   Past Psychiatric History: see initial Psychiatry evaluation note  [place] Scale:  [place] ED from 08/31/2022 in [facility] Emergency Department at [facility] ED from 08/12/2022 in [facility] Emergency Department at [place] ED from 08/04/2022 in [facility] [place] High Risk Error: Q3, 4, or 5 should not be populated when Q2 is No Error: Question 6 not populated       Past Medical History:  Past Medical History:  Diagnosis Date   ADHD (attention deficit hyperactivity disorder)    Bipolar 1 disorder (HCC)    Chronic back pain    Chronic constipation    Chronic neck pain    Hypertension    Multiple sclerosis ([place]) 05/20/2013   left sided weakness,  dysarthria   Non-compliance    Obesity    Pulmonary embolism ([place])    Schizophrenia ([place])    Stroke ([place])    left sided deficits - pt's mother denies this    Past Surgical History:  Procedure Laterality Date   NO PAST SURGERIES     None     RADIOLOGY WITH ANESTHESIA N/A 01/16/2021   Procedure: MRI WITH ANESTHESIA CERVICAL AND THORASIC SPINE WITH AND WITHOUT CONTRAST;  Surgeon: Radiologist, Medication, MD;  Location: [place];  Service: Radiology;  Laterality: N/A;   TOOTH EXTRACTION N/A 06/24/2019   Procedure: DENTAL RESTORATION/EXTRACTION OF TEETH NUMBER ONE, SIXTEEN, SEVENTEEN, NINETEEN, THIRTY-TWO;  Surgeon: Diona Browner, DDS;  Location: [place];  Service: Oral Surgery;  Laterality: N/A;   Family History:  Family History  Problem Relation Age of Onset   Diabetes Mother    ADD / ADHD Brother    Family Psychiatric  History: see initial Psychiatry evaluation note Social History:  Social History   Substance and Sexual Activity  Alcohol Use Not Currently   Alcohol/week: 0.0 standard drinks of alcohol   Comment: "A little bit"      Social History   Substance and Sexual Activity  Drug Use Not Currently   Types: Marijuana   Comment: Last used: unknown     Social History   Socioeconomic History   Marital status: Single    Spouse name: Not on file   Number of children: 0   Years of education: 11th   Highest education level: Not on  file  Occupational History   Occupation: unemployed    Employer: Chief of Staff    Comment: Disbaled  Tobacco Use   Smoking status: Every Day    Packs/day: 0.25    Types: Cigarettes   Smokeless tobacco: Never   Tobacco comments:    2 cigarettes a day  Vaping Use   Vaping Use: Never used  Substance and Sexual Activity   Alcohol use: Not Currently    Alcohol/week: 0.0 standard drinks of alcohol    Comment: "A little bit"    Drug use: Not Currently    Types: Marijuana    Comment: Last used: unknown    Sexual activity: Not on file  Other  Topics Concern   Not on file  Social History Narrative   Patient lives at home with his mother.   Disabled.   Education 11 th grade .   Right handed.   Drinks caffeine occassionally   Social Determinants of Radio broadcast assistant Strain: Not on file  Food Insecurity: Not on file  Transportation Needs: Not on file  Physical Activity: Not on file  Stress: Not on file  Social Connections: Not on file    Sleep: Good  Appetite:  Good  Current Medications: Current Facility-Administered Medications  Medication Dose Route Frequency Provider Last Rate Last Admin   amantadine (SYMMETREL) capsule 100 mg  100 mg Oral BID Kommor, [place], MD   100 mg at 09/02/22 0924   amLODipine (NORVASC) tablet 5 mg  5 mg Oral q AM Kommor, [place], MD   5 mg at 09/02/22 0745   apixaban (ELIQUIS) tablet 5 mg  5 mg Oral BID Kommor, [place], MD   5 mg at 09/02/22 0924   divalproex (DEPAKOTE) DR tablet 500 mg  500 mg Oral Q12H Lynell Kussman C, NP   500 mg at 09/02/22 [id number]   haloperidol (HALDOL) tablet 10 mg  10 mg Oral BID Kommor, [place], MD   10 mg at 09/02/22 [id number]   losartan (COZAAR) tablet 50 mg  50 mg Oral BID Kommor, [place], MD   50 mg at 09/02/22 [id number]   nicotine (NICODERM CQ - dosed in mg/24 hours) patch 14 mg  14 mg Transdermal Daily Kommor, [place], MD       nicotine polacrilex (NICORETTE) gum 2 mg  2 mg Oral PRN Kommor, [place], MD       temazepam (RESTORIL) capsule 7.5 mg  7.5 mg Oral QHS PRN Delfin Gant, NP       Current Outpatient Medications  Medication Sig Dispense Refill   acetaminophen (TYLENOL) 500 MG tablet Take 1,000 mg by mouth 3 (three) times daily as needed for moderate pain.     amantadine (SYMMETREL) 100 MG capsule Take 100 mg by mouth 2 (two) times daily.     amLODipine (NORVASC) 5 MG tablet Take 5 mg by mouth in the morning.     apixaban (ELIQUIS) 5 MG TABS tablet Take 1 tablet (5 mg total) by mouth 2 (two) times daily. 60 tablet 0   divalproex (DEPAKOTE) 500 MG  DR tablet Take 1 tablet (500 mg total) by mouth every 12 (twelve) hours. (Patient taking differently: Take 500 mg by mouth 2 (two) times daily.) 60 tablet 0   docusate sodium (COLACE) 100 MG capsule Take 1 capsule (100 mg total) by mouth 2 (two) times daily. (Patient taking differently: Take 100 mg by mouth in the morning, at noon, and at bedtime.) 60 capsule 0   DULCOLAX 5 MG  EC tablet Take 15 mg by mouth at bedtime.     hydrOXYzine (ATARAX) 25 MG tablet Take 25 mg by mouth 3 (three) times daily.     Ibuprofen-diphenhydrAMINE Cit (ADVIL PM PO) Take 3 tablets by mouth at bedtime as needed (sleep).     loratadine (CLARITIN) 10 MG tablet Take 1 tablet (10 mg total) by mouth daily as needed for allergies. (Patient taking differently: Take 10 mg by mouth at bedtime.) 30 tablet 0   losartan (COZAAR) 50 MG tablet Take 1 tablet (50 mg total) by mouth 2 (two) times daily. 60 tablet 0   LUMIGAN 0.01 % SOLN Place 1 drop into both eyes at bedtime. 2.5 mL 0   Multiple Vitamins-Minerals (MULTIVITAMIN WITH MINERALS) tablet Take 1 tablet by mouth daily.     NICORETTE 2 MG gum Take 2 mg by mouth as needed for smoking cessation.     ocrelizumab (OCREVUS) 300 MG/10ML injection Inject 2 vials ( 20 MLs) into the vein every 6 months (Patient taking differently: Inject 600 mg into the vein See admin instructions. Infuse 600 mg every 8 months) 20 mL 0   paliperidone (INVEGA) 9 MG 24 hr tablet Take 9 mg by mouth in the morning.     benztropine (COGENTIN) 1 MG tablet Take 1 tablet (1 mg total) by mouth 2 (two) times daily. (Patient not taking: Reported on 09/01/2022) 60 tablet 0   haloperidol (HALDOL) 10 MG tablet Take 1 tablet (10 mg total) by mouth 2 (two) times daily. (Patient not taking: Reported on 09/01/2022) 60 tablet 0   LORazepam (ATIVAN) 0.5 MG tablet Take 0.5 mg by mouth 2 (two) times daily as needed for anxiety or sedation. (Patient not taking: Reported on 09/01/2022)     nicotine (NICODERM CQ - DOSED IN MG/24 HOURS)  14 mg/24hr patch Place 1 patch (14 mg total) onto the skin daily. (Patient not taking: Reported on 09/01/2022) 28 patch 0   OLANZapine (ZYPREXA) 15 MG tablet Take 1 tablet (15 mg total) by mouth at bedtime. (Patient not taking: Reported on 09/01/2022) 30 tablet 0   temazepam (RESTORIL) 22.5 MG capsule Take 22.5 mg by mouth at bedtime as needed for sleep. (Patient not taking: Reported on 09/01/2022)     topiramate (TOPAMAX) 25 MG tablet TAKE ONE TABLET BY MOUTH TWICE A DAY (Patient not taking: Reported on 09/01/2022) 60 tablet 4    Lab Results:  Results for orders placed or performed during the hospital encounter of 08/31/22 (from the past 48 hour(s))  Resp panel by RT-PCR (RSV, Flu A&B, Covid) Anterior Nasal Swab     Status: None   Collection Time: 08/31/22 10:12 PM   Specimen: Anterior Nasal Swab  Result Value Ref Range   SARS Coronavirus 2 by RT PCR NEGATIVE NEGATIVE    Comment: (NOTE) SARS-CoV-2 target nucleic acids are NOT DETECTED.  The SARS-CoV-2 RNA is generally detectable in upper respiratory specimens during the acute phase of infection. The lowest concentration of SARS-CoV-2 viral copies this assay can detect is 138 copies/mL. A negative result does not preclude SARS-Cov-2 infection and should not be used as the sole basis for treatment or other patient management decisions. A negative result may occur with  improper specimen collection/handling, submission of specimen other than nasopharyngeal swab, presence of viral mutation(s) within the areas targeted by this assay, and inadequate number of viral copies(<138 copies/mL). A negative result must be combined with clinical observations, patient history, and epidemiological information. The expected result is Negative.  Fact  Sheet for Patients:  EntrepreneurPulse.com.au  Fact Sheet for Healthcare Providers:  IncredibleEmployment.be  This test is no t yet approved or cleared by the [place] FDA and  has been authorized for detection and/or diagnosis of SARS-CoV-2 by FDA under an Emergency Use Authorization (EUA). This EUA will remain  in effect (meaning this test can be used) for the duration of the COVID-19 declaration under Section 564(b)(1) of the Act, 21 U.S.C.section 360bbb-3(b)(1), unless the authorization is terminated  or revoked sooner.       Influenza A by PCR NEGATIVE NEGATIVE   Influenza B by PCR NEGATIVE NEGATIVE    Comment: (NOTE) The Xpert Xpress SARS-CoV-2/FLU/RSV plus assay is intended as an aid in the diagnosis of influenza from Nasopharyngeal swab specimens and should not be used as a sole basis for treatment. Nasal washings and aspirates are unacceptable for Xpert Xpress SARS-CoV-2/FLU/RSV testing.  Fact Sheet for Patients: EntrepreneurPulse.com.au  Fact Sheet for Healthcare Providers: IncredibleEmployment.be  This test is not yet approved or cleared by the [place] FDA and has been authorized for detection and/or diagnosis of SARS-CoV-2 by FDA under an Emergency Use Authorization (EUA). This EUA will remain in effect (meaning this test can be used) for the duration of the COVID-19 declaration under Section 564(b)(1) of the Act, 21 U.S.C. section 360bbb-3(b)(1), unless the authorization is terminated or revoked.     Resp Syncytial Virus by PCR NEGATIVE NEGATIVE    Comment: (NOTE) Fact Sheet for Patients: EntrepreneurPulse.com.au  Fact Sheet for Healthcare Providers: IncredibleEmployment.be  This test is not yet approved or cleared by the [place] FDA and has been authorized for detection and/or diagnosis of SARS-CoV-2 by FDA under an Emergency Use Authorization (EUA). This EUA will remain in effect (meaning this test can be used) for the duration of the COVID-19 declaration under Section 564(b)(1) of the Act, 21 U.S.C. section 360bbb-3(b)(1), unless  the authorization is terminated or revoked.  Performed at [facility], [place] [address], [place], [place] [address]   Comprehensive metabolic panel     Status: Abnormal   Collection Time: 08/31/22 10:32 PM  Result Value Ref Range   Sodium 141 135 - 145 mmol/L   Potassium 3.8 3.5 - 5.1 mmol/L   Chloride 106 98 - 111 mmol/L   CO2 25 22 - 32 mmol/L   Glucose, Bld 101 (H) 70 - 99 mg/dL    Comment: Glucose reference range applies only to samples taken after fasting for at least 8 hours.   BUN 18 6 - 20 mg/dL   Creatinine, Ser 1.23 0.61 - 1.24 mg/dL   Calcium 9.2 8.9 - 10.3 mg/dL   Total Protein 6.9 6.5 - 8.1 g/dL   Albumin 4.5 3.5 - 5.0 g/dL   AST 13 (L) 15 - 41 U/L   ALT 23 0 - 44 U/L   Alkaline Phosphatase 60 38 - 126 U/L   Total Bilirubin 0.5 0.3 - 1.2 mg/dL   GFR, Estimated >60 >60 mL/min    Comment: (NOTE) Calculated using the CKD-EPI Creatinine Equation (2021)    Anion gap 10 5 - 15    Comment: Performed at [facility], [place] [address], [place], [place] [address]  cbc     Status: Abnormal   Collection Time: 08/31/22 10:32 PM  Result Value Ref Range   WBC 7.5 4.0 - 10.5 K/uL   RBC 4.98 4.22 - 5.81 MIL/uL   Hemoglobin 12.8 (L) 13.0 - 17.0 g/dL   HCT 41.9  39.0 - 52.0 %   MCV 84.1 80.0 - 100.0 fL   MCH 25.7 (L) 26.0 - 34.0 pg   MCHC 30.5 30.0 - 36.0 g/dL   RDW 14.6 11.5 - 15.5 %   Platelets 181 150 - 400 K/uL   nRBC 0.0 0.0 - 0.2 %    Comment: Performed at [facility], [place] [address] [place], [place] [address]  Ethanol     Status: None   Collection Time: 08/31/22 10:52 PM  Result Value Ref Range   Alcohol, Ethyl (B) <10 <10 mg/dL    Comment: (NOTE) Lowest detectable limit for serum alcohol is 10 mg/dL.  For medical purposes only. Performed at [facility], [place] [address] [place], [place] [id number]   Salicylate level     Status: Abnormal   Collection Time: 08/31/22 10:52 PM  Result Value  Ref Range   Salicylate Lvl [id number] (L) 7.0 - 30.0 mg/dL    Comment: Performed at [facility], [place] [address] [place], [place] [address]  Acetaminophen level     Status: Abnormal   Collection Time: 08/31/22 10:52 PM  Result Value Ref Range   Acetaminophen (Tylenol), Serum <10 (L) 10 - 30 ug/mL    Comment: (NOTE) Therapeutic concentrations vary significantly. A range of 10-30 ug/mL  may be an effective concentration for many patients. However, some  are best treated at concentrations outside of this range. Acetaminophen concentrations >150 ug/mL at 4 hours after ingestion  and >50 ug/mL at 12 hours after ingestion are often associated with  toxic reactions.  Performed at [facility], [place] [address] [place], [place] [address]   Valproic acid level     Status: Abnormal   Collection Time: 09/01/22  1:24 PM  Result Value Ref Range   Valproic Acid Lvl 46 (L) 50.0 - 100.0 ug/mL    Comment: Performed at [facility], [place] [address], [place], [place] [address]  Rapid urine drug screen (hospital performed)     Status: None   Collection Time: 09/01/22  5:42 PM  Result Value Ref Range   Opiates NONE DETECTED NONE DETECTED   Cocaine NONE DETECTED NONE DETECTED   Benzodiazepines NONE DETECTED NONE DETECTED   Amphetamines NONE DETECTED NONE DETECTED   Tetrahydrocannabinol NONE DETECTED NONE DETECTED   Barbiturates NONE DETECTED NONE DETECTED    Comment: (NOTE) DRUG SCREEN FOR MEDICAL PURPOSES ONLY.  IF CONFIRMATION IS NEEDED FOR ANY PURPOSE, NOTIFY LAB WITHIN 5 DAYS.  LOWEST DETECTABLE LIMITS FOR URINE DRUG SCREEN Drug Class                     Cutoff (ng/mL) Amphetamine and metabolites    1000 Barbiturate and metabolites    200 Benzodiazepine                 200 Opiates and metabolites        300 Cocaine and metabolites        300 THC                            50 Performed at [facility], [place] [address] [place],  [address]     Blood Alcohol level:  Lab Results  Component Value Date   [facility] <10 08/31/2022   ETH <10 07/11/2022    Physical Findings:  CIWA:    COWS:     Musculoskeletal: Strength &  Muscle Tone: within normal limits Gait & Station: normal Patient leans: Front  Psychiatric Specialty Exam:  Presentation  General Appearance:  Bizarre; Disheveled; Other (comment) (Obese)  Eye Contact: Fleeting  Speech: Slow; Slurred  Speech Volume: Decreased  Handedness: Right   Mood and Affect  Mood: Dysphoric  Affect: Blunt; Restricted   Thought Process  Thought Processes: Linear; Disorganized  Descriptions of Associations:Tangential  Orientation:Partial  Thought Content:Logical; Illogical  History of Schizophrenia/Schizoaffective disorder:Yes  Duration of Psychotic Symptoms:Greater than six months  Hallucinations:Hallucinations: Auditory Description of Auditory Hallucinations: Responding to stimuli.  Ocassionally steers at the wall talking.  Occasioanlally yells out for no reason.  Ideas of Reference:None  Suicidal Thoughts:Suicidal Thoughts: No  Homicidal Thoughts:Homicidal Thoughts: No   Sensorium  Memory: Immediate Fair; Recent Fair; Remote Fair  Judgment: Impaired  Insight: Lacking   Executive Functions  Concentration: Poor  Attention Span: Fair  Recall: [employer] of Knowledge: Fair  Language: Fair   Psychomotor Activity  Psychomotor Activity: Psychomotor Activity: Normal   Assets  Assets: Communication Skills; Housing   Sleep  Sleep: Sleep: Good    Physical Exam: Physical Exam Vitals and nursing note reviewed.  Constitutional:      Appearance: Normal appearance.  HENT:     Head: Normocephalic and atraumatic.     Nose: Nose normal.  Cardiovascular:     Rate and Rhythm: Normal rate and regular rhythm.  Pulmonary:     Effort: Pulmonary effort is normal.  Musculoskeletal:        General:  Normal range of motion.     Cervical back: Normal range of motion.  Skin:    General: Skin is warm and dry.  Neurological:     Mental Status: He is alert and oriented to person, place, and time.    Review of Systems  Constitutional: Negative.   HENT: Negative.    Eyes: Negative.   Respiratory: Negative.    Cardiovascular: Negative.   Gastrointestinal: Negative.   Genitourinary: Negative.   Musculoskeletal: Negative.   Skin: Negative.   Neurological: Negative.   Endo/Heme/Allergies: Negative.   Psychiatric/Behavioral:  Positive for hallucinations and suicidal ideas.    Blood pressure 136/84, pulse 73, temperature 97.6 F (36.4 C), temperature source Oral, resp. rate 16, SpO2 99 %. There is no height or weight on file to calculate BMI.  Medical Decision Making: Patient continues to require inpatient Psychiatry hospitalization.  Depakote Level is low and he just resumed same yesterday and cannot state if he was taking it at home.  We will repeat Depakote level in three days.  We will obtain EKG for QTC interval evaluation Continue current Medications.  Problem 1: Disorganized Schizophrenia   Problem 2: Suicide ideation   Disposition:  Admit, seek bed placement  Delfin Gant, NP 09/02/2022, 3:12 PM

## 2022-09-03 ENCOUNTER — Encounter (HOSPITAL_COMMUNITY): Payer: Self-pay

## 2022-09-03 DIAGNOSIS — F201 Disorganized schizophrenia: Secondary | ICD-10-CM | POA: Diagnosis not present

## 2022-09-03 NOTE — ED Notes (Signed)
Pt came out of room, agitated and yelling at internal stimuli. Pt easily redirected back to room, walked in and slammed the door. Pt is now sporadically laughing and singing to himself.

## 2022-09-03 NOTE — Progress Notes (Addendum)
Inpatient Behavioral Health Placement  Pt meets inpatient criteria per Delfin Gant, NP. There are no available beds at [place] per [place] contacted [facility] IDD unit [phone] and referral can be faxed to [phone] at this time there are no male beds however CSW was advised to follow up in the morning; 1st shift to follow up. Referral was sent to the following facilities;    Destination  Service Provider Address Phone Fax  [place] [place] [address] [phone]  [facility]  [place], [place] [address] [place]  [facility]  [address] [place] [address] [phone]  [facility]  [place], [place] [address] [phone] [facility]  [phone] [address] [place] [address] [phone] [facility]  [address] [place], [place] [address] [phone] [place] [place] [address] [place]  [facility]  [address], [place] [address] [phone]  [place] [place] [address] [phone]  [facility]  [place] [address] [phone]  [facility]  [address], [place] [address] [phone] [facility]  [place] [address] [phone] [facility]  [address], [place] [address] [place]  [address] [place] [address] [phone]  [facility]  [address] [place]  [address] [place]   [facility]  [address] [place] [address] [phone] [place]  [address] [place] [address] [place]., [place] [address] [id number]  [facility]  [address] [place] [address] [id number]  [facility]  [address] [place]  [id number]  [place], [place] [phone] [place]  [employer] [place]  [address] [place], [place] [address] [phone] [address] [address] [place] [address] [phone]  [facility]  [address], [place] [address] [phone]  [facility]  [address], [place] [address] [phone]   Situation ongoing,  CSW will follow up.   Benjaman Kindler, MSW, LCSWA 09/03/2022  @ 1:01 AM

## 2022-09-03 NOTE — ED Provider Notes (Signed)
Emergency Medicine Observation Re-evaluation Note  [place] [name] is a 32 y.o. male, seen on rounds today.  Pt initially presented to the ED for complaints of Hallucinations (SI/HI) Currently, the patient is resting comfortably in no acute distress.  Physical Exam  BP 95/61 (BP Location: Right Arm)   Pulse 74   Temp 98.1 F (36.7 C) (Axillary)   Resp 16   SpO2 100%  Physical Exam General: Appears to be resting comfortably in bed, no acute distress. Cardiac: Regular rate, normal heart rate, non-emergent blood pressure for this morning's vitals. Lungs: No increased work of breathing.  Equal chest rise appreciated Psych: Calm, asleep in bed.   ED Course / MDM  EKG:EKG Interpretation  Date/Time:  Tuesday September 02 2022 16:55:50 EST Ventricular Rate:  61 PR Interval:  120 QRS Duration: 90 QT Interval:  386 QTC Calculation: 388 R Axis:   30 Text Interpretation: Normal sinus rhythm Normal ECG When compared with ECG of 02-Sep-2022 16:54, PREVIOUS ECG IS PRESENT No significant change since last tracing Confirmed by Fredia Sorrow [id number]) on 09/02/2022 5:07:55 PM  I have reviewed the labs performed to date as well as medications administered while in observation.    Plan  Current plan is for admission for psychiatric hospital.    Tretha Sciara, MD 09/03/22 [phone]

## 2022-09-03 NOTE — Progress Notes (Signed)
LCSW Progress Note  [id number]   ZAK HYKE  09/03/2022  9:21 AM  Description:   Inpatient Psychiatric Referral  Patient was recommended inpatient per Charmaine Downs, NP. There are no available beds at [facility]. Patient was referred to the following facilities:   Destination  Service Provider Address Phone Fax  [place] [place] [address] [phone]  [facility]  [place], [place] [address] Imogene  [facility]  [address] [place] [address] [phone] [place]  [place], [place] [address] [phone]  [facility]  [phone] [address] [place] [address] [phone] [facility]  [address] [place], [place] [address] [phone] [place] [address] [place] [address] [place]  [facility]  [address] [place] [address] [phone]  [place] [address] [place] [address] [phone]  [facility]  [place] [address] [phone]  [facility]  [address] [place] [address] [phone] [facility]  [place] [address] [phone] [facility]  [address], [place] [address] [place]  [address], [place] [address] [phone]  [facility]  [address] [place] [address] [place]  [facility]  [address] [place] [address] [id number]   [place]  [address] [place] [address] [place]  [place] [place] [address] [phone]  [facility]  [address], [place] [address] [id number]  [facility]  [address] [place] [address] [phone]  [place], [phone] [place]  [address] [place], [place] [address] [phone] [place] [place] [address] [phone]  [facility]  [address], [place] [address] [phone]  [facility]  [address], [place]  [address] [phone]    Situation ongoing, CSW to continue following and update chart as more information becomes available.      Denna Haggard, [place]  09/03/2022 9:21 AM

## 2022-09-03 NOTE — Progress Notes (Signed)
[facility] Psych ED Progress Note  09/03/2022 1:36 PM BROOK ANCHONDO  MRN:  [id number]   Principal Problem: Schizophrenia, disorganized type [facility]) Diagnosis:  Principal Problem:   Schizophrenia, disorganized type [facility])   ED Assessment Time Calculation: Start Time: [id number] Stop Time: 1200 Total Time in Minutes (Assessment Completion): 15   Subjective: Brandon [name], 32 y.o., male patient seen face to face by this provider, consulted with Dr. Dwyane Dee; and chart reviewed on 09/03/22.  Brandon [name] is a 32 y.o. male patient admitted with previous hx of Disorganized Schizophrenia and Bipolar disorder brought in by his grandmother for Auditory hallucination and suicide ideation.  Patient seen face-to-face but is provider, patient lying in his bed watching television, provider asked why he was here and he states my grandmother sent me here due to hallucinations.  Patient denies SI/HI/VH but says that he is hearing voices that tell him to do stupid stuff, like harm other people.  Informed Brandon [name] is a 32 y.o. male patient admitted with previous hx of Disorganized Schizophrenia and Bipolar disorder brought in by his grandmother for Auditory hallucination and suicide ideation. Patient was drowsy this morning and was unable to fully participate in the evaluation.  Patient was seen this morning in bed.  He gets up for shower with prompt from Nursing staff.  Patient appears to not hebe been taking his medications as prescribed.  Depakote level came back to 46 and he could not explain if he is taking his medications or not.  Patient remains Psychotic occasionally yelling out for no reason.  He steers looking at the wall or ceiling.  He occasionally is seen responding to unseen people.  Patient continues to require inpatient hospitalization.  We will continue to fax out records to facilities with available bed. mother that he is able to talk to staff if he feels he will harm someone.  Patient states his  appetite and sleep are good.  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  Patient states he drinks about 3-4 beers a week, but denies using any illicit drugs. Per chart review patient appears to not have been taking his medications at home as prescribed, but patient has been compliant with medication while in the emergency department.  Patient remains psychotic, he has a blank stare at provider as if he is responding to internal stimuli, but patient denies. Patient has a long history of being seen in the emergency departments and psychiatric hospitalizations.  Patient continues to require inpatient hospitalization.  Patient is appropriate and cooperative, alert and oriented x 4, eye contact is fleeting, and speech is slow and pressured.    Past Psychiatric History:  Schizophrenia, disorganized type ([place])   [place] Scale:  [facility] from 08/31/2022 in [facility] Emergency Department at [facility] from 08/12/2022 in [facility] from 08/04/2022 in [facility] Error: Q3, 4, or 5 should not be populated when Q2 is No Error: Question 6 not populated       Past Medical History:  Past Medical History:  Diagnosis Date   ADHD (attention deficit hyperactivity disorder)    Bipolar 1 disorder (HCC)    Chronic back pain    Chronic constipation    Chronic neck pain    Hypertension    Multiple sclerosis ([place]) 05/20/2013   left sided weakness, dysarthria   Non-compliance  Obesity    Pulmonary embolism ([place])    Schizophrenia ([place])    Stroke ([place])    left sided deficits - pt's mother denies this    Past Surgical History:  Procedure Laterality Date   NO PAST SURGERIES     None     RADIOLOGY WITH ANESTHESIA N/A 01/16/2021   Procedure: MRI WITH ANESTHESIA CERVICAL AND THORASIC SPINE WITH AND WITHOUT CONTRAST;  Surgeon: Radiologist, Medication, MD;   Location: [facility];  Service: Radiology;  Laterality: N/A;   TOOTH EXTRACTION N/A 06/24/2019   Procedure: DENTAL RESTORATION/EXTRACTION OF TEETH NUMBER ONE, SIXTEEN, SEVENTEEN, NINETEEN, THIRTY-TWO;  Surgeon: Diona Browner, DDS;  Location: [facility];  Service: Oral Surgery;  Laterality: N/A;   Family History:  Family History  Problem Relation Age of Onset   Diabetes Mother    ADD / ADHD Brother     Social History:  Social History   Substance and Sexual Activity  Alcohol Use Not Currently   Alcohol/week: 0.0 standard drinks of alcohol   Comment: "A little bit"      Social History   Substance and Sexual Activity  Drug Use Not Currently   Types: Marijuana   Comment: Last used: unknown     Social History   Socioeconomic History   Marital status: Single    Spouse name: Not on file   Number of children: 0   Years of education: 11th   Highest education level: Not on file  Occupational History   Occupation: unemployed    Employer: [employer]    Comment: Disbaled  Tobacco Use   Smoking status: Every Day    Packs/day: 0.25    Types: Cigarettes   Smokeless tobacco: Never   Tobacco comments:    2 cigarettes a day  Vaping Use   Vaping Use: Never used  Substance and Sexual Activity   Alcohol use: Not Currently    Alcohol/week: 0.0 standard drinks of alcohol    Comment: "A little bit"    Drug use: Not Currently    Types: Marijuana    Comment: Last used: unknown    Sexual activity: Not on file  Other Topics Concern   Not on file  Social History Narrative   Patient lives at home with his mother.   Disabled.   Education 11 th grade .   Right handed.   Drinks caffeine occassionally   Social Determinants of Radio broadcast assistant Strain: Not on file  Food Insecurity: Not on file  Transportation Needs: Not on file  Physical Activity: Not on file  Stress: Not on file  Social Connections: Not on file    Sleep: Good  Appetite:  Good  Current  Medications: Current Facility-Administered Medications  Medication Dose Route Frequency Provider Last Rate Last Admin   amantadine (SYMMETREL) capsule 100 mg  100 mg Oral BID Kommor, [place], MD   100 mg at 09/03/22 1039   amLODipine (NORVASC) tablet 5 mg  5 mg Oral q AM Kommor, [place], MD   5 mg at 09/03/22 1038   apixaban (ELIQUIS) tablet 5 mg  5 mg Oral BID Kommor, [place], MD   5 mg at 09/03/22 1039   divalproex (DEPAKOTE) DR tablet 500 mg  500 mg Oral Q12H [place], Josephine C, NP   500 mg at 09/03/22 1039   haloperidol (HALDOL) tablet 10 mg  10 mg Oral BID Kommor, Madison, MD   10 mg at 09/03/22 1039   losartan (COZAAR) tablet 50 mg  50  mg Oral BID Kommor, [place], MD   50 mg at 09/03/22 1039   nicotine (NICODERM CQ - dosed in mg/24 hours) patch 14 mg  14 mg Transdermal Daily Kommor, [place], MD   14 mg at 09/03/22 1042   nicotine polacrilex (NICORETTE) gum 2 mg  2 mg Oral PRN Kommor, [place], MD       temazepam (RESTORIL) capsule 7.5 mg  7.5 mg Oral QHS PRN Charmaine Downs C, NP   7.5 mg at 09/02/22 2051   Current Outpatient Medications  Medication Sig Dispense Refill   acetaminophen (TYLENOL) 500 MG tablet Take 1,000 mg by mouth 3 (three) times daily as needed for moderate pain.     amantadine (SYMMETREL) 100 MG capsule Take 100 mg by mouth 2 (two) times daily.     amLODipine (NORVASC) 5 MG tablet Take 5 mg by mouth in the morning.     apixaban (ELIQUIS) 5 MG TABS tablet Take 1 tablet (5 mg total) by mouth 2 (two) times daily. 60 tablet 0   divalproex (DEPAKOTE) 500 MG DR tablet Take 1 tablet (500 mg total) by mouth every 12 (twelve) hours. (Patient taking differently: Take 500 mg by mouth 2 (two) times daily.) 60 tablet 0   docusate sodium (COLACE) 100 MG capsule Take 1 capsule (100 mg total) by mouth 2 (two) times daily. (Patient taking differently: Take 100 mg by mouth in the morning, at noon, and at bedtime.) 60 capsule 0   DULCOLAX 5 MG EC tablet Take 15 mg by mouth at bedtime.      hydrOXYzine (ATARAX) 25 MG tablet Take 25 mg by mouth 3 (three) times daily.     Ibuprofen-diphenhydrAMINE Cit (ADVIL PM PO) Take 3 tablets by mouth at bedtime as needed (sleep).     loratadine (CLARITIN) 10 MG tablet Take 1 tablet (10 mg total) by mouth daily as needed for allergies. (Patient taking differently: Take 10 mg by mouth at bedtime.) 30 tablet 0   losartan (COZAAR) 50 MG tablet Take 1 tablet (50 mg total) by mouth 2 (two) times daily. 60 tablet 0   LUMIGAN 0.01 % SOLN Place 1 drop into both eyes at bedtime. 2.5 mL 0   Multiple Vitamins-Minerals (MULTIVITAMIN WITH MINERALS) tablet Take 1 tablet by mouth daily.     NICORETTE 2 MG gum Take 2 mg by mouth as needed for smoking cessation.     ocrelizumab (OCREVUS) 300 MG/10ML injection Inject 2 vials ( 20 MLs) into the vein every 6 months (Patient taking differently: Inject 600 mg into the vein See admin instructions. Infuse 600 mg every 8 months) 20 mL 0   paliperidone (INVEGA) 9 MG 24 hr tablet Take 9 mg by mouth in the morning.     benztropine (COGENTIN) 1 MG tablet Take 1 tablet (1 mg total) by mouth 2 (two) times daily. (Patient not taking: Reported on 09/01/2022) 60 tablet 0   haloperidol (HALDOL) 10 MG tablet Take 1 tablet (10 mg total) by mouth 2 (two) times daily. (Patient not taking: Reported on 09/01/2022) 60 tablet 0   LORazepam (ATIVAN) 0.5 MG tablet Take 0.5 mg by mouth 2 (two) times daily as needed for anxiety or sedation. (Patient not taking: Reported on 09/01/2022)     nicotine (NICODERM CQ - DOSED IN MG/24 HOURS) 14 mg/24hr patch Place 1 patch (14 mg total) onto the skin daily. (Patient not taking: Reported on 09/01/2022) 28 patch 0   OLANZapine (ZYPREXA) 15 MG tablet Take 1 tablet (15 mg total)  by mouth at bedtime. (Patient not taking: Reported on 09/01/2022) 30 tablet 0   temazepam (RESTORIL) 22.5 MG capsule Take 22.5 mg by mouth at bedtime as needed for sleep. (Patient not taking: Reported on 09/01/2022)     topiramate  (TOPAMAX) 25 MG tablet TAKE ONE TABLET BY MOUTH TWICE A DAY (Patient not taking: Reported on 09/01/2022) 60 tablet 4    Lab Results:  Results for orders placed or performed during the hospital encounter of 08/31/22 (from the past 48 hour(s))  Rapid urine drug screen (hospital performed)     Status: None   Collection Time: 09/01/22  5:42 PM  Result Value Ref Range   Opiates NONE DETECTED NONE DETECTED   Cocaine NONE DETECTED NONE DETECTED   Benzodiazepines NONE DETECTED NONE DETECTED   Amphetamines NONE DETECTED NONE DETECTED   Tetrahydrocannabinol NONE DETECTED NONE DETECTED   Barbiturates NONE DETECTED NONE DETECTED    Comment: (NOTE) DRUG SCREEN FOR MEDICAL PURPOSES ONLY.  IF CONFIRMATION IS NEEDED FOR ANY PURPOSE, NOTIFY LAB WITHIN 5 DAYS.  LOWEST DETECTABLE LIMITS FOR URINE DRUG SCREEN Drug Class                     Cutoff (ng/mL) Amphetamine and metabolites    1000 Barbiturate and metabolites    200 Benzodiazepine                 200 Opiates and metabolites        300 Cocaine and metabolites        300 THC                            50 Performed at [facility], [place] [address] [place], [place] [address]     Blood Alcohol level:  Lab Results  Component Value Date   [facility] <10 08/31/2022   ETH <10 07/11/2022    Physical Findings:  CIWA:    COWS:     Musculoskeletal:  Patient is observed resting in his bed.   Psychiatric Specialty Exam:  Presentation  General Appearance:  Bizarre  Eye Contact: Fleeting  Speech: Slow; Slurred  Speech Volume: Decreased  Handedness: Right   Mood and Affect  Mood: Anxious  Affect: Restricted; Congruent   Thought Process  Thought Processes: Disorganized  Descriptions of Associations:Tangential  Orientation:Partial  Thought Content:Logical; Illogical  History of Schizophrenia/Schizoaffective disorder:Yes  Duration of Psychotic Symptoms:Greater than six  months  Hallucinations:Hallucinations: Auditory Description of Auditory Hallucinations: voices telling him to harm people  Ideas of Reference:None  Suicidal Thoughts:Suicidal Thoughts: No  Homicidal Thoughts:Homicidal Thoughts: No   Sensorium  Memory: Immediate Fair; Recent Fair; Remote Fair  Judgment: Impaired  Insight: Fair   Materials engineer: Fair  Attention Span: Fair  Recall: [employer] of Knowledge: Fair  Language: Fair   Psychomotor Activity  Psychomotor Activity: Psychomotor Activity: Normal   Assets  Assets: Armed forces logistics/support/administrative officer; Social Support; Housing   Sleep  Sleep: Sleep: Good    Physical Exam: Physical Exam Vitals and nursing note reviewed. Exam conducted with a chaperone present.  Musculoskeletal:        General: Normal range of motion.     Cervical back: Normal range of motion.  Neurological:     Mental Status: He is alert.  Psychiatric:        Attention and Perception: He perceives auditory hallucinations.        Mood and Affect: Affect is  blunt.        Behavior: Behavior is slowed. Behavior is cooperative.        Thought Content: Thought content is delusional.        Cognition and Memory: Cognition is impaired.        Judgment: Judgment is inappropriate.     Comments: Speech is slow an pressured.     Review of Systems  Constitutional: Negative.   HENT: Negative.    Musculoskeletal: Negative.   Psychiatric/Behavioral:  Positive for hallucinations.    Blood pressure (!) 138/98, pulse 74, temperature 98.1 F (36.7 C), temperature source Axillary, resp. rate 16, SpO2 100 %. There is no height or weight on file to calculate BMI.   Medical Decision Making: Patient continues to require inpatient Psychiatric hospitalization.  Patient has been compliant with medications in the ED, behavior has been appropriate and cooperative.    Saige Canton MOTLEY-MANGRUM, PMHNP 09/03/2022, 1:36 PM

## 2022-09-03 NOTE — Progress Notes (Signed)
CSW spoke with [facility] IDD unit triage via phone call [phone] at 10:58 AM. Triage/intake coordinator advised this CSW that IDD unit is at capacity at this time. CSW will continue to assist and follow with placement.  Denna Haggard, [place]  09/03/2022 11:01 AM

## 2022-09-04 DIAGNOSIS — F201 Disorganized schizophrenia: Secondary | ICD-10-CM | POA: Diagnosis not present

## 2022-09-04 NOTE — ED Notes (Signed)
Pt received their dinner tray.

## 2022-09-04 NOTE — ED Notes (Signed)
Pt ambulated to the bathroom to take a shower.

## 2022-09-04 NOTE — ED Notes (Addendum)
Call returned to pt legal guardian Runett Walthall, at 22:21, who requested an update on pt condition, message left on answer machine, pt is currently safe some what loud but stable at this time.

## 2022-09-04 NOTE — Progress Notes (Signed)
[facility] Psych ED Progress Note  09/04/2022 7:33 PM NIM GELWICKS  MRN:  [id number]   Principal Problem: Schizophrenia, disorganized type [facility]) Diagnosis:  Principal Problem:   Schizophrenia, disorganized type [facility])   ED Assessment Time Calculation: Start Time: 1845 Stop Time: 1900 Total Time in Minutes (Assessment Completion): 15   Subjective: Brandon [name], 32 y.o., male patient seen face to face by this provider, consulted with Dr. Dwyane Dee; and chart reviewed on 09/04/22.  Patient admitted with previous hx of Disorganized Schizophrenia and Bipolar disorder brought in by his grandmother for Auditory hallucination and suicide ideation.  Patient lying in his bed watching television, provider asked why he was here and he states my grandmother sent me here due to hallucinations.  Patient denies SI/HI/VH but continues to endorse AH to harm other people. Patient alert and oriented x 3, eye contact is fleeting, and speech is slow and pressured. Patient states he is ready to go home, provider discussed with him the plan for his stay, patient said "ok".  Patient occasionally is seen responding to unseen people.  Patient continues to require inpatient hospitalization. Patient states his appetite and sleep are good. Patient has been compliant with medication while in the emergency department. Patient has a long history of being seen in the emergency departments and psychiatric hospitalizations. Patient is appropriate and cooperative.   [place] Scale:  [facility] from 08/31/2022 in [facility] Emergency Department at [facility] ED from 08/12/2022 in [facility] Emergency Department at [facility] ED from 08/04/2022 in [facility] [place] [facility] Error: Q3, 4, or 5 should not be populated when Q2 is No Error: Question 6 not populated       Past Medical History:  Past Medical History:  Diagnosis Date   ADHD (attention deficit  hyperactivity disorder)    Bipolar 1 disorder (HCC)    Chronic back pain    Chronic constipation    Chronic neck pain    Hypertension    Multiple sclerosis ([place]) 05/20/2013   left sided weakness, dysarthria   Non-compliance    Obesity    Pulmonary embolism (HCC)    Schizophrenia (HCC)    Stroke ([place])    left sided deficits - pt's mother denies this    Past Surgical History:  Procedure Laterality Date   NO PAST SURGERIES     None     RADIOLOGY WITH ANESTHESIA N/A 01/16/2021   Procedure: MRI WITH ANESTHESIA CERVICAL AND THORASIC SPINE WITH AND WITHOUT CONTRAST;  Surgeon: Radiologist, Medication, MD;  Location: [facility];  Service: Radiology;  Laterality: N/A;   TOOTH EXTRACTION N/A 06/24/2019   Procedure: DENTAL RESTORATION/EXTRACTION OF TEETH NUMBER ONE, SIXTEEN, SEVENTEEN, NINETEEN, THIRTY-TWO;  Surgeon: Diona Browner, DDS;  Location: [facility];  Service: Oral Surgery;  Laterality: N/A;   Family History:  Family History  Problem Relation Age of Onset   Diabetes Mother    ADD / ADHD Brother     Social History:  Social History   Substance and Sexual Activity  Alcohol Use Not Currently   Alcohol/week: 0.0 standard drinks of alcohol   Comment: "A little bit"      Social History   Substance and Sexual Activity  Drug Use Not Currently   Types: Marijuana   Comment: Last used: unknown     Social History   Socioeconomic History   Marital status: Single    Spouse name: Not on file   Number  of children: 0   Years of education: 11th   Highest education level: Not on file  Occupational History   Occupation: unemployed    Employer: Chief of Staff    Comment: Disbaled  Tobacco Use   Smoking status: Every Day    Packs/day: 0.25    Types: Cigarettes   Smokeless tobacco: Never   Tobacco comments:    2 cigarettes a day  Vaping Use   Vaping Use: Never used  Substance and Sexual Activity   Alcohol use: Not Currently    Alcohol/week: 0.0 standard drinks of alcohol    Comment:  "A little bit"    Drug use: Not Currently    Types: Marijuana    Comment: Last used: unknown    Sexual activity: Not on file  Other Topics Concern   Not on file  Social History Narrative   Patient lives at home with his mother.   Disabled.   Education 11 th grade .   Right handed.   Drinks caffeine occassionally   Social Determinants of Radio broadcast assistant Strain: Not on file  Food Insecurity: Not on file  Transportation Needs: Not on file  Physical Activity: Not on file  Stress: Not on file  Social Connections: Not on file    Sleep: Good  Appetite:  Good  Current Medications: Current Facility-Administered Medications  Medication Dose Route Frequency Provider Last Rate Last Admin   amantadine (SYMMETREL) capsule 100 mg  100 mg Oral BID Kommor, Madison, MD   100 mg at 09/04/22 0935   amLODipine (NORVASC) tablet 5 mg  5 mg Oral q AM Kommor, Madison, MD   5 mg at 09/04/22 0935   apixaban (ELIQUIS) tablet 5 mg  5 mg Oral BID Kommor, Madison, MD   5 mg at 09/04/22 0935   divalproex (DEPAKOTE) DR tablet 500 mg  500 mg Oral Q12H Onu[place], Josephine C, NP   500 mg at 09/04/22 0935   haloperidol (HALDOL) tablet 10 mg  10 mg Oral BID Kommor, Madison, MD   10 mg at 09/04/22 0935   losartan (COZAAR) tablet 50 mg  50 mg Oral BID Kommor, Madison, MD   50 mg at 09/04/22 0935   nicotine (NICODERM CQ - dosed in mg/24 hours) patch 14 mg  14 mg Transdermal Daily Kommor, Madison, MD   14 mg at 09/04/22 [id number]   nicotine polacrilex (NICORETTE) gum 2 mg  2 mg Oral PRN Kommor, Madison, MD       temazepam (RESTORIL) capsule 7.5 mg  7.5 mg Oral QHS PRN Charmaine Downs C, NP   7.5 mg at 09/02/22 2051   Current Outpatient Medications  Medication Sig Dispense Refill   acetaminophen (TYLENOL) 500 MG tablet Take 1,000 mg by mouth 3 (three) times daily as needed for moderate pain.     amantadine (SYMMETREL) 100 MG capsule Take 100 mg by mouth 2 (two) times daily.     amLODipine (NORVASC) 5 MG  tablet Take 5 mg by mouth in the morning.     apixaban (ELIQUIS) 5 MG TABS tablet Take 1 tablet (5 mg total) by mouth 2 (two) times daily. 60 tablet 0   divalproex (DEPAKOTE) 500 MG DR tablet Take 1 tablet (500 mg total) by mouth every 12 (twelve) hours. (Patient taking differently: Take 500 mg by mouth 2 (two) times daily.) 60 tablet 0   docusate sodium (COLACE) 100 MG capsule Take 1 capsule (100 mg total) by mouth 2 (two) times daily. (Patient taking  differently: Take 100 mg by mouth in the morning, at noon, and at bedtime.) 60 capsule 0   DULCOLAX 5 MG EC tablet Take 15 mg by mouth at bedtime.     hydrOXYzine (ATARAX) 25 MG tablet Take 25 mg by mouth 3 (three) times daily.     Ibuprofen-diphenhydrAMINE Cit (ADVIL PM PO) Take 3 tablets by mouth at bedtime as needed (sleep).     loratadine (CLARITIN) 10 MG tablet Take 1 tablet (10 mg total) by mouth daily as needed for allergies. (Patient taking differently: Take 10 mg by mouth at bedtime.) 30 tablet 0   losartan (COZAAR) 50 MG tablet Take 1 tablet (50 mg total) by mouth 2 (two) times daily. 60 tablet 0   LUMIGAN 0.01 % SOLN Place 1 drop into both eyes at bedtime. 2.5 mL 0   Multiple Vitamins-Minerals (MULTIVITAMIN WITH MINERALS) tablet Take 1 tablet by mouth daily.     NICORETTE 2 MG gum Take 2 mg by mouth as needed for smoking cessation.     ocrelizumab (OCREVUS) 300 MG/10ML injection Inject 2 vials ( 20 MLs) into the vein every 6 months (Patient taking differently: Inject 600 mg into the vein See admin instructions. Infuse 600 mg every 8 months) 20 mL 0   paliperidone (INVEGA) 9 MG 24 hr tablet Take 9 mg by mouth in the morning.     benztropine (COGENTIN) 1 MG tablet Take 1 tablet (1 mg total) by mouth 2 (two) times daily. (Patient not taking: Reported on 09/01/2022) 60 tablet 0   haloperidol (HALDOL) 10 MG tablet Take 1 tablet (10 mg total) by mouth 2 (two) times daily. (Patient not taking: Reported on 09/01/2022) 60 tablet 0   LORazepam (ATIVAN)  0.5 MG tablet Take 0.5 mg by mouth 2 (two) times daily as needed for anxiety or sedation. (Patient not taking: Reported on 09/01/2022)     nicotine (NICODERM CQ - DOSED IN MG/24 HOURS) 14 mg/24hr patch Place 1 patch (14 mg total) onto the skin daily. (Patient not taking: Reported on 09/01/2022) 28 patch 0   OLANZapine (ZYPREXA) 15 MG tablet Take 1 tablet (15 mg total) by mouth at bedtime. (Patient not taking: Reported on 09/01/2022) 30 tablet 0   temazepam (RESTORIL) 22.5 MG capsule Take 22.5 mg by mouth at bedtime as needed for sleep. (Patient not taking: Reported on 09/01/2022)     topiramate (TOPAMAX) 25 MG tablet TAKE ONE TABLET BY MOUTH TWICE A DAY (Patient not taking: Reported on 09/01/2022) 60 tablet 4    Lab Results: No results found for this or any previous visit (from the past 48 hour(s)).  Blood Alcohol level:  Lab Results  Component Value Date   [facility] <10 08/31/2022   ETH <10 07/11/2022    Physical Findings:  CIWA:    COWS:     Musculoskeletal:  Patient observed resting in bed.  Psychiatric Specialty Exam:  Presentation  General Appearance:  Disheveled  Eye Contact: Fleeting  Speech: Slow  Speech Volume: Decreased  Handedness: Right   Mood and Affect  Mood: Euthymic  Affect: Restricted; Congruent   Thought Process  Thought Processes: Linear  Descriptions of Associations:Tangential  Orientation:Partial  Thought Content:Illogical; Logical  History of Schizophrenia/Schizoaffective disorder:Yes  Duration of Psychotic Symptoms:Greater than six months  Hallucinations:Hallucinations: Auditory Description of Auditory Hallucinations: to hurt people  Ideas of Reference:None  Suicidal Thoughts:Suicidal Thoughts: No  Homicidal Thoughts:Homicidal Thoughts: No   Sensorium  Memory: Immediate Fair; Recent Fair  Judgment: Impaired  Insight: Fair  Executive Functions  Concentration: Fair  Attention Span: Fair  Recall: [employer] of  Knowledge: Fair  Language: Fair   Psychomotor Activity  Psychomotor Activity: Psychomotor Activity: Normal   Assets  Assets: Communication Skills   Sleep  Sleep: Sleep: Good    Physical Exam: Physical Exam Vitals and nursing note reviewed. Exam conducted with a chaperone present.  HENT:     Nose: Nose normal.  Eyes:     Pupils: Pupils are equal, round, and reactive to light.  Abdominal:     General: Abdomen is flat.  Musculoskeletal:        General: Normal range of motion.  Skin:    General: Skin is warm.  Neurological:     Mental Status: He is alert.  Psychiatric:        Attention and Perception: Attention normal.        Mood and Affect: Mood normal. Affect is flat.        Speech: Speech is delayed and slurred.        Behavior: Behavior is slowed. Behavior is cooperative.        Thought Content: Thought content is delusional.        Cognition and Memory: Cognition is impaired.        Judgment: Judgment is inappropriate.    Review of Systems  Constitutional: Negative.   HENT: Negative.    Respiratory: Negative.    Psychiatric/Behavioral:  Positive for hallucinations.        Delusional   Blood pressure 109/68, pulse 69, temperature (!) 97.4 F (36.3 C), temperature source Oral, resp. rate 18, SpO2 100 %. There is no height or weight on file to calculate BMI.   Medical Decision Making: Patient continues to require inpatient Psychiatric hospitalization.  Patient has been compliant with medications in the ED, behavior has been appropriate and cooperative.     [place], PMHNP 09/04/2022, 7:33 PM

## 2022-09-04 NOTE — ED Provider Notes (Signed)
Emergency Medicine Observation Re-evaluation Note  Brandon [name] is a 32 y.o. male, seen on rounds today.  Pt initially presented to the ED for complaints of Hallucinations (SI/HI) Currently, the patient is resting comfortably in no acute distress.  Physical Exam  BP 120/77 (BP Location: Left Arm)   Pulse 84   Temp (!) 97.4 F (36.3 C) (Oral)   Resp 18   SpO2 98%  Physical Exam General: Appears to be resting comfortably in bed, no acute distress. Cardiac: Regular rate, normal heart rate, non-emergent blood pressure for this morning's vitals. Lungs: No increased work of breathing.  Equal chest rise appreciated Psych: Calm, asleep in bed.   ED Course / MDM  EKG:EKG Interpretation  Date/Time:  Tuesday September 02 2022 16:55:50 EST Ventricular Rate:  61 PR Interval:  120 QRS Duration: 90 QT Interval:  386 QTC Calculation: 388 R Axis:   30 Text Interpretation: Normal sinus rhythm Normal ECG When compared with ECG of 02-Sep-2022 16:54, PREVIOUS ECG IS PRESENT No significant change since last tracing Confirmed by Fredia Sorrow [phone]) on 09/02/2022 5:07:55 PM  I have reviewed the labs performed to date as well as medications administered while in observation.    Plan  Current plan is for inpatient admission for psychiatric hospital.     Audley Hose, MD 09/04/22 [phone]

## 2022-09-04 NOTE — Progress Notes (Addendum)
CSW reviewed with [facility], Juanda Crumble to inquire if pt is listed in their Statewide IDD DX list. Juanda Crumble reports that pt is not listed in their system and has not had an admission at [facility]. Juanda Crumble informs CSW about [facility], however this pt's Disposition is for Inpatient Behavioral Health placement. Disposition team will continue to assist and follow with placement.   Benjaman Kindler, MSW, LCSWA 09/04/2022 1:18 AM

## 2022-09-04 NOTE — Progress Notes (Addendum)
CSW spoke with Roderic Palau, triage specialist, at [facility] unit via phone call at 9:36 AM. CSW inquired about adult male bed availability. Roderic Palau advised CSW that IDD unit is currently at capacity. CSW will continue to assist and follow with placement.  Herbie Baltimore  09/04/2022 9:38 AM

## 2022-09-04 NOTE — ED Notes (Signed)
Patient resting quietly at bedside.

## 2022-09-05 DIAGNOSIS — F201 Disorganized schizophrenia: Secondary | ICD-10-CM | POA: Diagnosis not present

## 2022-09-05 MED ORDER — ZIPRASIDONE MESYLATE 20 MG IM SOLR
20.0000 mg | INTRAMUSCULAR | Status: AC | PRN
  Administered 2022-09-05: 20 mg via INTRAMUSCULAR
  Filled 2022-09-05: qty 20

## 2022-09-05 MED ORDER — PALIPERIDONE ER 6 MG PO TB24
9.0000 mg | ORAL_TABLET | Freq: Every day | ORAL | Status: DC
  Administered 2022-09-05 – 2022-09-09 (×5): 9 mg via ORAL
  Filled 2022-09-05 (×5): qty 1

## 2022-09-05 MED ORDER — STERILE WATER FOR INJECTION IJ SOLN
INTRAMUSCULAR | Status: AC
  Administered 2022-09-05: 10 mL
  Filled 2022-09-05: qty 10

## 2022-09-05 MED ORDER — POLYETHYLENE GLYCOL 3350 17 G PO PACK
17.0000 g | PACK | Freq: Every day | ORAL | Status: DC | PRN
  Administered 2022-09-05: 17 g via ORAL
  Filled 2022-09-05: qty 1

## 2022-09-05 MED ORDER — LORAZEPAM 1 MG PO TABS
1.0000 mg | ORAL_TABLET | ORAL | Status: AC | PRN
  Administered 2022-09-09: 1 mg via ORAL
  Filled 2022-09-05 (×3): qty 1

## 2022-09-05 MED ORDER — OLANZAPINE 10 MG PO TBDP
10.0000 mg | ORAL_TABLET | Freq: Three times a day (TID) | ORAL | Status: DC | PRN
  Administered 2022-09-06 – 2022-09-09 (×3): 10 mg via ORAL
  Filled 2022-09-05 (×4): qty 1

## 2022-09-05 NOTE — Progress Notes (Signed)
CSW received a phone call from pt's [place], [name] at [phone]. CSW discussed pt's current disposition and plan to complete referral to [facility]. CSW inquired about pt's IQ score and most recent psychiatric evaluation. James sent this CSW secure email with the information listed above. CSW is awaiting response from [facility] for next steps regarding development center application. CSW has completed the development center screening form. CSW informed Jeneen Rinks of admission requirements from [facility]. [facility] states that all requests for admission must be supported by a letter of endorsement from the person's responsible LME. CSW will await response from [facility] regarding next steps in application process. CSW will continue communication with pt's care coordinator. CSW will continue to assist and follow with placement.  Denna Haggard, Latanya Presser  09/05/2022 10:51 AM

## 2022-09-05 NOTE — Progress Notes (Signed)
[facility] ED Progress Note  09/05/2022 7:33 PM Brandon [name]  MRN:  [id number]   Principal Problem: Schizophrenia, disorganized type [facility]) Diagnosis:  Principal Problem:   Schizophrenia, disorganized type [facility])   ED Assessment Time Calculation: Start Time: 1130 Stop Time: 1145 Total Time in Minutes (Assessment Completion): [place], 32 y.o., male patient seen face to face by this provider, consulted with Dr. Dwyane Dee; and chart reviewed on 09/05/22.  Patient admitted with previous hx of Disorganized Schizophrenia and Bipolar disorder brought in by his grandmother for Auditory hallucination and suicide ideation.  Past provider and behavioral health coordinator and walking into the patient's room, patient was saying punching at the floor, but striking the air, provider asked patient what he was doing and he stated "somebody say get him, so I am getting him."Provider asked patient who he was getting and he responded that he did not know.  Patient stated that he was in a good mood today, his appetite and sleep were good.  Denies SI/HI, but can be seen responding to internal stimuli, as he is talking to the floor. Patient alert and oriented x 3, eye contact is fleeting, and speech is slow and pressured.  Patient continues to ask "when am I going home "provider informed him that we are looking for a safe place for him, and he stated "okay ".  Patient states that is okay being here in the hospital, but he is ready to go home. Patient is appropriate and cooperative.   At 1604 per RN note, patient has been coming out of his room responding to internal stimuli stating that he was going to punch through a glass, patient banging on walls in his room and becoming verbally aggressive toward staff.  At 1615 patient was in shower banging on a punching walls, responding to internal stimuli.  Is 1620 provider went into the bathroom where patient was, escorted by security staff and spoke with patient,  asked him about his medications, patient states that he likes to be on [place] and that Haldol, states he takes [place] at home and it works best for him.  While talking to provider patient began banging on the walls responding to a person that was not there, patient was responding as if he was in a fight.  Patient QTc on EKG if 388.  Provider ordered agitation protocols for patient to maintain his safety, patient has been aggressive physically and verbally.    [place] Scale:  [place] ED from 08/31/2022 in [facility] Emergency Department at [facility] ED from 08/12/2022 in [facility] Emergency Department at [facility] ED from 08/04/2022 in [facility] [place] High Risk Error: Q3, 4, or 5 should not be populated when Q2 is No Error: Question 6 not populated       Past Medical History:  Past Medical History:  Diagnosis Date   ADHD (attention deficit hyperactivity disorder)    Bipolar 1 disorder (HCC)    Chronic back pain    Chronic constipation    Chronic neck pain    Hypertension    Multiple sclerosis ([place]) 05/20/2013   left sided weakness, dysarthria   Non-compliance    Obesity    Pulmonary embolism (HCC)    Schizophrenia (HCC)    Stroke ([place])    left sided deficits - pt's mother denies this    Past Surgical History:  Procedure Laterality Date   NO PAST SURGERIES  None     RADIOLOGY WITH ANESTHESIA N/A 01/16/2021   Procedure: MRI WITH ANESTHESIA CERVICAL AND THORASIC SPINE WITH AND WITHOUT CONTRAST;  Surgeon: Radiologist, Medication, MD;  Location: [place];  Service: Radiology;  Laterality: N/A;   TOOTH EXTRACTION N/A 06/24/2019   Procedure: DENTAL RESTORATION/EXTRACTION OF TEETH NUMBER ONE, SIXTEEN, SEVENTEEN, NINETEEN, THIRTY-TWO;  Surgeon: Diona Browner, DDS;  Location: [place];  Service: Oral Surgery;  Laterality: N/A;   Family History:  Family History  Problem Relation Age of Onset   Diabetes Mother    ADD /  ADHD Brother     Social History:  Social History   Substance and Sexual Activity  Alcohol Use Not Currently   Alcohol/week: 0.0 standard drinks of alcohol   Comment: "A little bit"      Social History   Substance and Sexual Activity  Drug Use Not Currently   Types: Marijuana   Comment: Last used: unknown     Social History   Socioeconomic History   Marital status: Single    Spouse name: Not on file   Number of children: 0   Years of education: 11th   Highest education level: Not on file  Occupational History   Occupation: unemployed    Employer: [employer]    Comment: Disbaled  Tobacco Use   Smoking status: Every Day    Packs/day: 0.25    Types: Cigarettes   Smokeless tobacco: Never   Tobacco comments:    2 cigarettes a day  Vaping Use   Vaping Use: Never used  Substance and Sexual Activity   Alcohol use: Not Currently    Alcohol/week: 0.0 standard drinks of alcohol    Comment: "A little bit"    Drug use: Not Currently    Types: Marijuana    Comment: Last used: unknown    Sexual activity: Not on file  Other Topics Concern   Not on file  Social History Narrative   Patient lives at home with his mother.   Disabled.   Education 11 th grade .   Right handed.   Drinks caffeine occassionally   Social Determinants of Radio broadcast assistant Strain: Not on file  Food Insecurity: Not on file  Transportation Needs: Not on file  Physical Activity: Not on file  Stress: Not on file  Social Connections: Not on file    Sleep: Good  Appetite:  Good  Current Medications: Current Facility-Administered Medications  Medication Dose Route Frequency Provider Last Rate Last Admin   amantadine (SYMMETREL) capsule 100 mg  100 mg Oral BID Kommor, Madison, MD   100 mg at 09/05/22 0958   amLODipine (NORVASC) tablet 5 mg  5 mg Oral q AM Kommor, Madison, MD   5 mg at 09/04/22 0935   apixaban (ELIQUIS) tablet 5 mg  5 mg Oral BID Kommor, Madison, MD   5 mg at  09/05/22 0958   divalproex (DEPAKOTE) DR tablet 500 mg  500 mg Oral Q12H Onuoha, Josephine C, NP   500 mg at 09/05/22 0958   OLANZapine zydis (ZYPREXA) disintegrating tablet 10 mg  10 mg Oral Q8H PRN Motley-Mangrum, Viviane Semidey A, PMHNP       And   LORazepam (ATIVAN) tablet 1 mg  1 mg Oral PRN Motley-Mangrum, Paislei Dorval A, PMHNP       losartan (COZAAR) tablet 50 mg  50 mg Oral BID Kommor, Madison, MD   50 mg at 09/05/22 0958   nicotine (NICODERM CQ - dosed in mg/24 hours)  patch 14 mg  14 mg Transdermal Daily Kommor, Madison, MD   14 mg at 09/05/22 [id number]   nicotine polacrilex (NICORETTE) gum 2 mg  2 mg Oral PRN Kommor, Madison, MD       paliperidone (INVEGA) 24 hr tablet 9 mg  9 mg Oral Daily Motley-Mangrum, Rosalba Totty A, PMHNP   9 mg at 09/05/22 1012   polyethylene glycol (MIRALAX / GLYCOLAX) packet 17 g  17 g Oral Daily PRN Cristie Hem, MD   17 g at 09/05/22 1336   temazepam (RESTORIL) capsule 7.5 mg  7.5 mg Oral QHS PRN Charmaine Downs C, NP   7.5 mg at 09/02/22 2051   Current Outpatient Medications  Medication Sig Dispense Refill   acetaminophen (TYLENOL) 500 MG tablet Take 1,000 mg by mouth 3 (three) times daily as needed for moderate pain.     amantadine (SYMMETREL) 100 MG capsule Take 100 mg by mouth 2 (two) times daily.     amLODipine (NORVASC) 5 MG tablet Take 5 mg by mouth in the morning.     apixaban (ELIQUIS) 5 MG TABS tablet Take 1 tablet (5 mg total) by mouth 2 (two) times daily. 60 tablet 0   divalproex (DEPAKOTE) 500 MG DR tablet Take 1 tablet (500 mg total) by mouth every 12 (twelve) hours. (Patient taking differently: Take 500 mg by mouth 2 (two) times daily.) 60 tablet 0   docusate sodium (COLACE) 100 MG capsule Take 1 capsule (100 mg total) by mouth 2 (two) times daily. (Patient taking differently: Take 100 mg by mouth in the morning, at noon, and at bedtime.) 60 capsule 0   DULCOLAX 5 MG EC tablet Take 15 mg by mouth at bedtime.     hydrOXYzine (ATARAX) 25 MG tablet Take 25 mg by  mouth 3 (three) times daily.     Ibuprofen-diphenhydrAMINE Cit (ADVIL PM PO) Take 3 tablets by mouth at bedtime as needed (sleep).     loratadine (CLARITIN) 10 MG tablet Take 1 tablet (10 mg total) by mouth daily as needed for allergies. (Patient taking differently: Take 10 mg by mouth at bedtime.) 30 tablet 0   losartan (COZAAR) 50 MG tablet Take 1 tablet (50 mg total) by mouth 2 (two) times daily. 60 tablet 0   LUMIGAN 0.01 % SOLN Place 1 drop into both eyes at bedtime. 2.5 mL 0   Multiple Vitamins-Minerals (MULTIVITAMIN WITH MINERALS) tablet Take 1 tablet by mouth daily.     NICORETTE 2 MG gum Take 2 mg by mouth as needed for smoking cessation.     ocrelizumab (OCREVUS) 300 MG/10ML injection Inject 2 vials ( 20 MLs) into the vein every 6 months (Patient taking differently: Inject 600 mg into the vein See admin instructions. Infuse 600 mg every 8 months) 20 mL 0   paliperidone (INVEGA) 9 MG 24 hr tablet Take 9 mg by mouth in the morning.     benztropine (COGENTIN) 1 MG tablet Take 1 tablet (1 mg total) by mouth 2 (two) times daily. (Patient not taking: Reported on 09/01/2022) 60 tablet 0   haloperidol (HALDOL) 10 MG tablet Take 1 tablet (10 mg total) by mouth 2 (two) times daily. (Patient not taking: Reported on 09/01/2022) 60 tablet 0   LORazepam (ATIVAN) 0.5 MG tablet Take 0.5 mg by mouth 2 (two) times daily as needed for anxiety or sedation. (Patient not taking: Reported on 09/01/2022)     nicotine (NICODERM CQ - DOSED IN MG/24 HOURS) 14 mg/24hr patch Place 1 patch (  14 mg total) onto the skin daily. (Patient not taking: Reported on 09/01/2022) 28 patch 0   OLANZapine (ZYPREXA) 15 MG tablet Take 1 tablet (15 mg total) by mouth at bedtime. (Patient not taking: Reported on 09/01/2022) 30 tablet 0   temazepam (RESTORIL) 22.5 MG capsule Take 22.5 mg by mouth at bedtime as needed for sleep. (Patient not taking: Reported on 09/01/2022)     topiramate (TOPAMAX) 25 MG tablet TAKE ONE TABLET BY MOUTH TWICE A DAY  (Patient not taking: Reported on 09/01/2022) 60 tablet 4    Lab Results: No results found for this or any previous visit (from the past 48 hour(s)).  Blood Alcohol level:  Lab Results  Component Value Date   ETH <10 08/31/2022   ETH <10 07/11/2022    Physical Findings:  CIWA:    COWS:     Musculoskeletal: Strength & Muscle Tone: within normal limits Gait & Station: normal Patient leans: N/A  Psychiatric Specialty Exam:  Presentation  General Appearance:  Bizarre  Eye Contact: Fleeting  Speech: Garbled  Speech Volume: Normal  Handedness: Right   Mood and Affect  Mood: Anxious  Affect: Appropriate   Thought Process  Thought Processes: Irrevelant  Descriptions of Associations:Loose  Orientation:Full (Time, Place and Person)  Thought Content:Delusions  History of Schizophrenia/Schizoaffective disorder:Yes  Duration of Psychotic Symptoms:Less than six months  Hallucinations:Hallucinations: Auditory; Visual Description of Auditory Hallucinations: unable to assess Description of Visual Hallucinations: a person  Ideas of Reference:Delusions  Suicidal Thoughts:Suicidal Thoughts: No  Homicidal Thoughts:Homicidal Thoughts: No   Sensorium  Memory: Immediate Fair; Remote Fair  Judgment: Impaired  Insight: Poor   Executive Functions  Concentration: Poor  Attention Span: Fair  Recall: [employer] of Knowledge: Fair  Language: Fair   Psychomotor Activity  Psychomotor Activity: Psychomotor Activity: Normal; Restlessness   Assets  Assets: Communication Skills   Sleep  Sleep: Sleep: Good    Physical Exam: Physical Exam HENT:     Head: Normocephalic.  Eyes:     Pupils: Pupils are equal, round, and reactive to light.  Musculoskeletal:        General: Normal range of motion.  Skin:    General: Skin is warm.  Neurological:     Mental Status: He is alert.  Psychiatric:        Attention and Perception: Attention  normal.        Mood and Affect: Mood is anxious.        Speech: Speech is rapid and pressured.        Behavior: Behavior is cooperative.        Thought Content: Thought content is delusional.        Judgment: Judgment is impulsive.    Review of Systems  Constitutional: Negative.   Musculoskeletal: Negative.   Skin: Negative.   Psychiatric/Behavioral:  Positive for hallucinations.    Blood pressure 94/73, pulse (!) 56, temperature 98.4 F (36.9 C), temperature source Oral, resp. rate 16, SpO2 94 %. There is no height or weight on file to calculate BMI.   Medical Decision Making: Patient continues to require inpatient Psychiatric hospitalization.  Patient has been compliant with medications in the ED, behavior has been appropriate and cooperative.  Patient placed on agitation protocol due to increased aggression physically and verbally today. CSW will await response from [facility] regarding next steps in application process.     [place], PMHNP 09/05/2022, 7:33 PM

## 2022-09-05 NOTE — ED Notes (Signed)
Assumed care of patient. Patient currently sleeping in bed with no signs of acute distress noted. Patient has equal chest rise and fall while sleeping. Will reassess patient once awake, allowing patient to sleep without interruptions. Patient pending placement in 100 unit.

## 2022-09-05 NOTE — ED Notes (Signed)
Patient currently in the shower, banging on the wall. Pt continues to respond to internal stimuli.

## 2022-09-05 NOTE — ED Notes (Signed)
Patient calm and cooperative in his room. Patient has continued to respond to internal stimuli, but has done so in his room. Patient denies any needs at this time.

## 2022-09-05 NOTE — ED Notes (Signed)
Patient currently sleeping in bed, resting comfortably with no signs of acute distress noted. Equal chest rise and fall.

## 2022-09-05 NOTE — ED Notes (Addendum)
Patient responding to internal stimuli. Came out of room stating, "I"m gonna punch through this glass right now." Patient then walks back into room and started banging head on the wall. Patient becoming verbally aggressive, calling staff names. Trying to redirect patient, as he has pulled all his sheets off his bed. Seeing if patient would like to shower, to redirect behavior.

## 2022-09-05 NOTE — ED Provider Notes (Signed)
Emergency Medicine Observation Re-evaluation Note  Brandon [name] is a 32 y.o. male, seen on rounds today.  Pt initially presented to the ED for complaints of Hallucinations (SI/HI) Currently, the patient is resting in bed. Denies complaints.  Physical Exam  BP (!) 101/48 (BP Location: Right Arm)   Pulse 70   Temp 98.7 F (37.1 C) (Oral)   Resp 18   SpO2 98%  Physical Exam General: No distress Cardiac: regular rate Lungs: No respiratory distress Psych: Calm  ED Course / MDM  EKG:EKG Interpretation  Date/Time:  Tuesday September 02 2022 16:55:50 EST Ventricular Rate:  61 PR Interval:  120 QRS Duration: 90 QT Interval:  386 QTC Calculation: 388 R Axis:   30 Text Interpretation: Normal sinus rhythm Normal ECG When compared with ECG of 02-Sep-2022 16:54, PREVIOUS ECG IS PRESENT No significant change since last tracing Confirmed by Fredia Sorrow [phone]) on 09/02/2022 5:07:55 PM  I have reviewed the labs performed to date as well as medications administered while in observation.  Recent changes in the last 24 hours include none.  Plan  Current plan is for psychiatric placement.    Cristie Hem, MD 09/05/22 [phone]

## 2022-09-05 NOTE — Progress Notes (Signed)
Inpatient Behavioral Health Placement  Pt has continues to meet inpatient behavioral health placement per [facility], PMHNP. CSW/ Disposition team has attempted to seek inpatient behavioral health placement however pt has been denied at multiple facilities: [facility] [place], [place], [place], [facility], [facility], [place], [place], [place], and [place].   CSW has attempted to obtain more information about [facility] programming. CSW attempted to reach this facility at [phone] as listed phone number but was not successful with making contact. CSW completed research on the referral process to [facility] and learned that an approval from the pt's LME is first needed. Pt is [employer] resident therefore [place] will need to follow up with [place]/ [place] in the morning to obtain more information on pt such as IQ score and referral process to a [facility].   This CSW did complete the [facility]al Center Screening form via online at this link through the [place] website: [email or web address]  1st shift to follow up to inquire about their program.     CBenjaman Kindler MSW, LPeacehealth Cott[facility]3/07/2022 1:04 AM

## 2022-09-06 DIAGNOSIS — F201 Disorganized schizophrenia: Secondary | ICD-10-CM | POA: Diagnosis not present

## 2022-09-06 LAB — VALPROIC ACID LEVEL: Valproic Acid Lvl: 52 ug/mL (ref 50.0–100.0)

## 2022-09-06 MED ORDER — DIVALPROEX SODIUM 500 MG PO DR TAB
750.0000 mg | DELAYED_RELEASE_TABLET | Freq: Every day | ORAL | Status: DC
  Administered 2022-09-06 – 2022-09-08 (×3): 750 mg via ORAL
  Filled 2022-09-06 (×3): qty 1

## 2022-09-06 MED ORDER — DIVALPROEX SODIUM 500 MG PO DR TAB
500.0000 mg | DELAYED_RELEASE_TABLET | Freq: Every day | ORAL | Status: DC
  Administered 2022-09-07 – 2022-09-09 (×3): 500 mg via ORAL
  Filled 2022-09-06 (×2): qty 1

## 2022-09-06 NOTE — ED Notes (Signed)
Pt is currently sleeping no distress or disturbed sleep patterns noted respirations are easy pt resting quietly.

## 2022-09-06 NOTE — ED Notes (Signed)
Pt showered and given new scrubs. New linens placed on bed.

## 2022-09-06 NOTE — Progress Notes (Addendum)
[facility] ED Progress Note  09/06/2022 1:21 PM TIMOTHEY REVILL  MRN:  [id number]   Principal Problem: Schizophrenia, disorganized type [facility]) Diagnosis:  Principal Problem:   Schizophrenia, disorganized type ([place])  Subjective:  Patent seen awake in his room calm and engaged in meaningful conversation.  He reports that he feels better since taking Paliperidone.  Per Nursing report his outburst has decreased although he still talks to unseen people at times.  He stays in his room which he keeps dirty with pieces of food on the floor.  Patient is compliant with medications.  His Depakote level today is 52 and Depakote is increased to 750 mg at night but day time remains at same dose of 500 mg.  Patient is eating and tolerating same.  He is occasionally incontinent of urine.  We discussed Paliperidone monthly injection.  Patient is interested in trying this injection. Patient needs prompt to clean room and shower and he remains incontinent of urine.  Provider discussed LAI Paliperidone  with his legal guardian who is also his mother.  She states that patient has tried LAI in the past and she cannot remember which one was effective.  She also says to wait till Monday to find out if patient's outpatient Psychiatrist will be offering this injection monthly after discharge. ED Assessment Time Calculation: Start Time: 1228 Stop Time: 1247 Total Time in Minutes (Assessment Completion): [place] Scale:  [place] ED from 08/31/2022 in [facility] Emergency Department at [facility] ED from 08/12/2022 in [facility] Emergency Department at [facility] ED from 08/04/2022 in [facility] [place] High Risk Error: Q3, 4, or 5 should not be populated when Q2 is No Error: Question 6 not populated       Past Medical History:  Past Medical History:  Diagnosis Date   ADHD (attention deficit hyperactivity disorder)    Bipolar 1 disorder (HCC)     Chronic back pain    Chronic constipation    Chronic neck pain    Hypertension    Multiple sclerosis ([place]) 05/20/2013   left sided weakness, dysarthria   Non-compliance    Obesity    Pulmonary embolism (HCC)    Schizophrenia (HCC)    Stroke ([place])    left sided deficits - pt's mother denies this    Past Surgical History:  Procedure Laterality Date   NO PAST SURGERIES     None     RADIOLOGY WITH ANESTHESIA N/A 01/16/2021   Procedure: MRI WITH ANESTHESIA CERVICAL AND THORASIC SPINE WITH AND WITHOUT CONTRAST;  Surgeon: Radiologist, Medication, MD;  Location: [place];  Service: Radiology;  Laterality: N/A;   TOOTH EXTRACTION N/A 06/24/2019   Procedure: DENTAL RESTORATION/EXTRACTION OF TEETH NUMBER ONE, SIXTEEN, SEVENTEEN, NINETEEN, THIRTY-TWO;  Surgeon: Diona Browner, DDS;  Location: [place];  Service: Oral Surgery;  Laterality: N/A;   Family History:  Family History  Problem Relation Age of Onset   Diabetes Mother    ADD / ADHD Brother     Social History:  Social History   Substance and Sexual Activity  Alcohol Use Not Currently   Alcohol/week: 0.0 standard drinks of alcohol   Comment: "A little bit"      Social History   Substance and Sexual Activity  Drug Use Not Currently   Types: Marijuana   Comment: Last used: unknown     Social History   Socioeconomic History   Marital status: Single  Spouse name: Not on file   Number of children: 0   Years of education: 11th   Highest education level: Not on file  Occupational History   Occupation: unemployed    Employer: [employer]    Comment: Disbaled  Tobacco Use   Smoking status: Every Day    Packs/day: 0.25    Types: Cigarettes   Smokeless tobacco: Never   Tobacco comments:    2 cigarettes a day  Vaping Use   Vaping Use: Never used  Substance and Sexual Activity   Alcohol use: Not Currently    Alcohol/week: 0.0 standard drinks of alcohol    Comment: "A little bit"    Drug use: Not Currently    Types:  Marijuana    Comment: Last used: unknown    Sexual activity: Not on file  Other Topics Concern   Not on file  Social History Narrative   Patient lives at home with his mother.   Disabled.   Education 11 th grade .   Right handed.   Drinks caffeine occassionally   Social Determinants of Radio broadcast assistant Strain: Not on file  Food Insecurity: Not on file  Transportation Needs: Not on file  Physical Activity: Not on file  Stress: Not on file  Social Connections: Not on file    Sleep: Good  Appetite:  Good  Current Medications: Current Facility-Administered Medications  Medication Dose Route Frequency Provider Last Rate Last Admin   amantadine (SYMMETREL) capsule 100 mg  100 mg Oral BID Kommor, Madison, MD   100 mg at 09/06/22 0911   amLODipine (NORVASC) tablet 5 mg  5 mg Oral q AM Kommor, Madison, MD   5 mg at 09/04/22 0935   apixaban (ELIQUIS) tablet 5 mg  5 mg Oral BID Kommor, Madison, MD   5 mg at 09/06/22 0911   [START ON 09/07/2022] divalproex (DEPAKOTE) DR tablet 500 mg  500 mg Oral Q1200 Ameerah Huffstetler C, NP       divalproex (DEPAKOTE) DR tablet 750 mg  750 mg Oral QHS Collyns Mcquigg C, NP       OLANZapine zydis (ZYPREXA) disintegrating tablet 10 mg  10 mg Oral Q8H PRN Motley-Mangrum, Jadeka A, PMHNP   10 mg at 09/06/22 1134   And   LORazepam (ATIVAN) tablet 1 mg  1 mg Oral PRN Motley-Mangrum, Jadeka A, PMHNP       losartan (COZAAR) tablet 50 mg  50 mg Oral BID Kommor, Madison, MD   50 mg at 09/06/22 [id number]   nicotine (NICODERM CQ - dosed in mg/24 hours) patch 14 mg  14 mg Transdermal Daily Kommor, Madison, MD   14 mg at 09/06/22 [id number]   nicotine polacrilex (NICORETTE) gum 2 mg  2 mg Oral PRN Kommor, Madison, MD       paliperidone (INVEGA) 24 hr tablet 9 mg  9 mg Oral Daily Motley-Mangrum, Jadeka A, PMHNP   9 mg at 09/06/22 0910   polyethylene glycol (MIRALAX / GLYCOLAX) packet 17 g  17 g Oral Daily PRN Cristie Hem, MD   17 g at 09/05/22 1336    temazepam (RESTORIL) capsule 7.5 mg  7.5 mg Oral QHS PRN Charmaine Downs C, NP   7.5 mg at 09/02/22 2051   Current Outpatient Medications  Medication Sig Dispense Refill   acetaminophen (TYLENOL) 500 MG tablet Take 1,000 mg by mouth 3 (three) times daily as needed for moderate pain.     amantadine (SYMMETREL) 100 MG capsule  Take 100 mg by mouth 2 (two) times daily.     amLODipine (NORVASC) 5 MG tablet Take 5 mg by mouth in the morning.     apixaban (ELIQUIS) 5 MG TABS tablet Take 1 tablet (5 mg total) by mouth 2 (two) times daily. 60 tablet 0   divalproex (DEPAKOTE) 500 MG DR tablet Take 1 tablet (500 mg total) by mouth every 12 (twelve) hours. (Patient taking differently: Take 500 mg by mouth 2 (two) times daily.) 60 tablet 0   docusate sodium (COLACE) 100 MG capsule Take 1 capsule (100 mg total) by mouth 2 (two) times daily. (Patient taking differently: Take 100 mg by mouth in the morning, at noon, and at bedtime.) 60 capsule 0   DULCOLAX 5 MG EC tablet Take 15 mg by mouth at bedtime.     hydrOXYzine (ATARAX) 25 MG tablet Take 25 mg by mouth 3 (three) times daily.     Ibuprofen-diphenhydrAMINE Cit (ADVIL PM PO) Take 3 tablets by mouth at bedtime as needed (sleep).     loratadine (CLARITIN) 10 MG tablet Take 1 tablet (10 mg total) by mouth daily as needed for allergies. (Patient taking differently: Take 10 mg by mouth at bedtime.) 30 tablet 0   losartan (COZAAR) 50 MG tablet Take 1 tablet (50 mg total) by mouth 2 (two) times daily. 60 tablet 0   LUMIGAN 0.01 % SOLN Place 1 drop into both eyes at bedtime. 2.5 mL 0   Multiple Vitamins-Minerals (MULTIVITAMIN WITH MINERALS) tablet Take 1 tablet by mouth daily.     NICORETTE 2 MG gum Take 2 mg by mouth as needed for smoking cessation.     ocrelizumab (OCREVUS) 300 MG/10ML injection Inject 2 vials ( 20 MLs) into the vein every 6 months (Patient taking differently: Inject 600 mg into the vein See admin instructions. Infuse 600 mg every 8 months) 20 mL  0   paliperidone (INVEGA) 9 MG 24 hr tablet Take 9 mg by mouth in the morning.     benztropine (COGENTIN) 1 MG tablet Take 1 tablet (1 mg total) by mouth 2 (two) times daily. (Patient not taking: Reported on 09/01/2022) 60 tablet 0   haloperidol (HALDOL) 10 MG tablet Take 1 tablet (10 mg total) by mouth 2 (two) times daily. (Patient not taking: Reported on 09/01/2022) 60 tablet 0   LORazepam (ATIVAN) 0.5 MG tablet Take 0.5 mg by mouth 2 (two) times daily as needed for anxiety or sedation. (Patient not taking: Reported on 09/01/2022)     nicotine (NICODERM CQ - DOSED IN MG/24 HOURS) 14 mg/24hr patch Place 1 patch (14 mg total) onto the skin daily. (Patient not taking: Reported on 09/01/2022) 28 patch 0   OLANZapine (ZYPREXA) 15 MG tablet Take 1 tablet (15 mg total) by mouth at bedtime. (Patient not taking: Reported on 09/01/2022) 30 tablet 0   temazepam (RESTORIL) 22.5 MG capsule Take 22.5 mg by mouth at bedtime as needed for sleep. (Patient not taking: Reported on 09/01/2022)     topiramate (TOPAMAX) 25 MG tablet TAKE ONE TABLET BY MOUTH TWICE A DAY (Patient not taking: Reported on 09/01/2022) 60 tablet 4    Lab Results:  Results for orders placed or performed during the hospital encounter of 08/31/22 (from the past 48 hour(s))  Valproic acid level     Status: None   Collection Time: 09/06/22  8:54 AM  Result Value Ref Range   Valproic Acid Lvl 52 50.0 - 100.0 ug/mL    Comment: Performed at  [facility] [employer] [address], [place], [place] [address]    Blood Alcohol level:  Lab Results  Component Value Date   [facility] <10 08/31/2022   ETH <10 07/11/2022    Physical Findings:  CIWA:    COWS:     Musculoskeletal: Strength & Muscle Tone: within normal limits Gait & Station: normal Patient leans: N/A  Psychiatric Specialty Exam:  Presentation  General Appearance:  Bizarre  Eye Contact: Fleeting  Speech: Garbled  Speech Volume: Normal  Handedness: Right   Mood  and Affect  Mood: Anxious  Affect: Appropriate   Thought Process  Thought Processes: Irrevelant  Descriptions of Associations:Loose  Orientation:Full (Time, Place and Person)  Thought Content:Delusions  History of Schizophrenia/Schizoaffective disorder:Yes  Duration of Psychotic Symptoms:Less than six months  Hallucinations:Hallucinations: Auditory; Visual Description of Auditory Hallucinations: unable to assess Description of Visual Hallucinations: a person  Ideas of Reference:Delusions  Suicidal Thoughts:Suicidal Thoughts: No  Homicidal Thoughts:Homicidal Thoughts: No   Sensorium  Memory: Immediate Fair; Remote Fair  Judgment: Impaired  Insight: Poor   Executive Functions  Concentration: Poor  Attention Span: Fair  Recall: [employer] of Knowledge: Fair  Language: Fair   Psychomotor Activity  Psychomotor Activity: Psychomotor Activity: Normal; Restlessness   Assets  Assets: Communication Skills   Sleep  Sleep: Sleep: Good    Physical Exam: Physical Exam HENT:     Head: Normocephalic.  Eyes:     Pupils: Pupils are equal, round, and reactive to light.  Musculoskeletal:        General: Normal range of motion.  Skin:    General: Skin is warm.  Neurological:     Mental Status: He is alert.  Psychiatric:        Attention and Perception: Attention normal.        Mood and Affect: Mood is anxious.        Speech: Speech is rapid and pressured.        Behavior: Behavior is cooperative.        Thought Content: Thought content is delusional.        Judgment: Judgment is impulsive.    Review of Systems  Constitutional: Negative.   Musculoskeletal: Negative.   Skin: Negative.   Psychiatric/Behavioral:  Positive for hallucinations.    Blood pressure 106/71, pulse 66, temperature 98 F (36.7 C), temperature source Tympanic, resp. rate 17, SpO2 99 %. There is no height or weight on file to calculate BMI.   Medical Decision  Making: Patient continues to require inpatient Psychiatric hospitalization.  Patient has been compliant with medications in the ED, behavior has been appropriate and cooperative.  Patient has had less anger outburst today.  He is willing to try Paliperidone LAI .  Provider  discussed with his Legal Guardian who is his mother. Regarding Paliperidone injection. She want to find out first if outpatient Psychiatrist will offer this after discharge.   Meanwhile he will continue taking current Medications.  Depakote is increased to 750 MG at night and 500 mg in the day time.  Depakote level came back 52 today.  Delfin Gant, NP-PMHNP-BC 09/06/2022, 1:21 PM

## 2022-09-06 NOTE — ED Notes (Signed)
Pt talking loudly to themselves (responding to internal stimuli), and standing in doorway.

## 2022-09-06 NOTE — ED Notes (Signed)
Brandon [name] is sleeping at this time respirations are easy skin color is appropriate for his ethnicity.

## 2022-09-06 NOTE — ED Notes (Signed)
Pt is talking to himself and appears agitated. Will administer scheduled and PRN meds

## 2022-09-06 NOTE — ED Notes (Signed)
Sleeping no distress noted.

## 2022-09-06 NOTE — ED Notes (Signed)
Pt's lights dimmed for optimal rest

## 2022-09-07 DIAGNOSIS — F201 Disorganized schizophrenia: Secondary | ICD-10-CM | POA: Diagnosis not present

## 2022-09-07 NOTE — ED Notes (Signed)
Pt came to nurse's station and stated that he was trying to get to bathroom but couldn't make it and urinated on himself. Floor in pt's room was wet with urine. Pt ambulated to restroom with this RN and gown and socks changed. Pt's floor mopped and bed linens changed. Pt appears to be drowsy and states that he is "just sleepy".

## 2022-09-07 NOTE — Progress Notes (Signed)
Patient has been denied by [facility] due to no appropriate beds available. AM CSW to follow up with insurance provider regarding the status of application for developmental center. Patient meets [place] inpatient criteria per Charmaine Downs, NP. Patient has been faxed out to the following facilities:    [place] [place] [address] [phone] [facility]  [place], [place] [address] [phone] [facility]  [address] [place] [address] [phone]  [place]  [place], [place] [address] [phone]  [facility]  [phone] [address] [place] [address] [phone] [facility]  [address] [place], [place] [address] [phone] [place] [address] [place] [address] [place]  [facility]  [address], [place] [address] [phone]  [place] [place] [address] [phone]  [facility]  [place] [address] [phone]  [facility]  [address] [place] [address] [phone] [facility]  [place] [phone] [facility]  [address] [place] [address] [place]  [address] [place] [address] [phone]  [facility]  [address] [place] [address] [place]  [facility]  [address], [place] [address] [phone] [place]   [address], [place] [address] [address] [place] [address] [id number]  [facility]  [address], [place] [address] [phone]  [facility]  [address] [place] [address] [id number]  [facility]  [address], [place] [phone] [place]  [place]  [address] [place], [place] [address] [phone] [address] [place] [address] [phone]  [facility]  [address] [place] [address] [phone]  [facility]  [address], [place] [address] [phone]   Mariea Clonts, MSW, LCSW-A  5:03 PM 09/07/2022

## 2022-09-07 NOTE — ED Provider Notes (Signed)
Emergency Medicine Observation Re-evaluation Note  Brandon [name] is a 32 y.o. male, seen on rounds today.  Pt initially presented to the ED for complaints of Hallucinations (SI/HI) Currently, the patient is awaitng disoposition Patient has ho schizophrenia He has been started on paliperidone and reportedly improved.  Physical Exam  BP 123/70 (BP Location: Right Arm)   Pulse 71   Temp 97.7 F (36.5 C) (Oral)   Resp 16   SpO2 100%  Physical Exam General: no distress Cardiac: rrr Lungs: no distress Psych: calm  ED Course / MDM  EKG:EKG Interpretation  Date/Time:  Tuesday September 02 2022 16:55:50 EST Ventricular Rate:  61 PR Interval:  120 QRS Duration: 90 QT Interval:  386 QTC Calculation: 388 R Axis:   30 Text Interpretation: Normal sinus rhythm Normal ECG When compared with ECG of 02-Sep-2022 16:54, PREVIOUS ECG IS PRESENT No significant change since last tracing Confirmed by Fredia Sorrow [phone]) on 09/02/2022 5:07:55 PM  I have reviewed the labs performed to date as well as medications administered while in observation.  Recent changes in the last 24 hours include none.  Plan  Current plan is for reviewed psych note Medically stable Per bh note, mother who is legal guardian who wants to wait until Monday "to find out if patient's op psychiatris will be offering this injection monthly after discharge .    Pattricia Boss, MD 09/07/22 1455

## 2022-09-07 NOTE — ED Notes (Signed)
Makes frequent request for snacks and food but other wise behavior has much improved since my last seeing Brandon [name] this past Friday on 09/05/2022. Brandon [name] has not displayed the loud laughing  and loud conversations that he had on my last seeing him and comes to his room door making request for food in a normal tone and volume voice.

## 2022-09-07 NOTE — Progress Notes (Addendum)
[facility] Psych ED Progress Note  09/07/2022 6:07 PM Brandon [name]  MRN:  [id number]   Principal Problem: Schizophrenia, disorganized type [facility]) Diagnosis:  Principal Problem:   Schizophrenia, disorganized type ([place])  Subjective:  No changes noted.  Patient stays in bed constantly asking for snacks and food. He is compliant with his medications.  He requires constant reminder to shower and take care of his ADLS. No agitation or aggressive behavior.  He continues to meet criteria for inpatient hospitalization.  Provider is waiting to hear from his mother whether his Psychiatrist will be offering LAI Invega after discharge.  If yes Invega LAI will be offered tomorrow.  He denies SI/HI but responds to IS although less in frequency. ED Assessment Time Calculation: Start Time: [id number] Stop Time: 1700 Total Time in Minutes (Assessment Completion): [place] Scale:  [place] ED from 08/31/2022 in [facility] Emergency Department at [facility] ED from 08/12/2022 in [facility] Emergency Department at [facility] ED from 08/04/2022 in [facility] [place] High Risk Error: Q3, 4, or 5 should not be populated when Q2 is No Error: Question 6 not populated       Past Medical History:  Past Medical History:  Diagnosis Date   ADHD (attention deficit hyperactivity disorder)    Bipolar 1 disorder (HCC)    Chronic back pain    Chronic constipation    Chronic neck pain    Hypertension    Multiple sclerosis ([place]) 05/20/2013   left sided weakness, dysarthria   Non-compliance    Obesity    Pulmonary embolism (HCC)    Schizophrenia (HCC)    Stroke ([place])    left sided deficits - pt's mother denies this    Past Surgical History:  Procedure Laterality Date   NO PAST SURGERIES     None     RADIOLOGY WITH ANESTHESIA N/A 01/16/2021   Procedure: MRI WITH ANESTHESIA CERVICAL AND THORASIC SPINE WITH AND WITHOUT CONTRAST;  Surgeon: Radiologist,  Medication, MD;  Location: [place];  Service: Radiology;  Laterality: N/A;   TOOTH EXTRACTION N/A 06/24/2019   Procedure: DENTAL RESTORATION/EXTRACTION OF TEETH NUMBER ONE, SIXTEEN, SEVENTEEN, NINETEEN, THIRTY-TWO;  Surgeon: Diona Browner, DDS;  Location: [place];  Service: Oral Surgery;  Laterality: N/A;   Family History:  Family History  Problem Relation Age of Onset   Diabetes Mother    ADD / ADHD Brother     Social History:  Social History   Substance and Sexual Activity  Alcohol Use Not Currently   Alcohol/week: 0.0 standard drinks of alcohol   Comment: "A little bit"      Social History   Substance and Sexual Activity  Drug Use Not Currently   Types: Marijuana   Comment: Last used: unknown     Social History   Socioeconomic History   Marital status: Single    Spouse name: Not on file   Number of children: 0   Years of education: 11th   Highest education level: Not on file  Occupational History   Occupation: unemployed    Employer: [employer]    Comment: Disbaled  Tobacco Use   Smoking status: Every Day    Packs/day: 0.25    Types: Cigarettes   Smokeless tobacco: Never   Tobacco comments:    2 cigarettes a day  Vaping Use   Vaping Use: Never used  Substance and Sexual Activity   Alcohol use:  Not Currently    Alcohol/week: 0.0 standard drinks of alcohol    Comment: "A little bit"    Drug use: Not Currently    Types: Marijuana    Comment: Last used: unknown    Sexual activity: Not on file  Other Topics Concern   Not on file  Social History Narrative   Patient lives at home with his mother.   Disabled.   Education 11 th grade .   Right handed.   Drinks caffeine occassionally   Social Determinants of Radio broadcast assistant Strain: Not on file  Food Insecurity: Not on file  Transportation Needs: Not on file  Physical Activity: Not on file  Stress: Not on file  Social Connections: Not on file    Sleep: Good  Appetite:  Good  Current  Medications: Current Facility-Administered Medications  Medication Dose Route Frequency Provider Last Rate Last Admin   amantadine (SYMMETREL) capsule 100 mg  100 mg Oral BID Kommor, Madison, MD   100 mg at 09/07/22 0913   amLODipine (NORVASC) tablet 5 mg  5 mg Oral q AM Kommor, Madison, MD   5 mg at 09/07/22 0913   apixaban (ELIQUIS) tablet 5 mg  5 mg Oral BID Kommor, Madison, MD   5 mg at 09/07/22 0913   divalproex (DEPAKOTE) DR tablet 500 mg  500 mg Oral Q1200 Areeb Corron C, NP   500 mg at 09/07/22 1454   divalproex (DEPAKOTE) DR tablet 750 mg  750 mg Oral QHS Lakin Romer C, NP   750 mg at 09/06/22 2104   OLANZapine zydis (ZYPREXA) disintegrating tablet 10 mg  10 mg Oral Q8H PRN Motley-Mangrum, Jadeka A, PMHNP   10 mg at 09/06/22 2104   And   LORazepam (ATIVAN) tablet 1 mg  1 mg Oral PRN Motley-Mangrum, Jadeka A, PMHNP       losartan (COZAAR) tablet 50 mg  50 mg Oral BID Kommor, Madison, MD   50 mg at 09/07/22 0915   nicotine (NICODERM CQ - dosed in mg/24 hours) patch 14 mg  14 mg Transdermal Daily Kommor, Madison, MD   14 mg at 09/06/22 [id number]   nicotine polacrilex (NICORETTE) gum 2 mg  2 mg Oral PRN Kommor, Madison, MD       paliperidone (INVEGA) 24 hr tablet 9 mg  9 mg Oral Daily Motley-Mangrum, Jadeka A, PMHNP   9 mg at 09/07/22 0913   polyethylene glycol (MIRALAX / GLYCOLAX) packet 17 g  17 g Oral Daily PRN Cristie Hem, MD   17 g at 09/05/22 1336   temazepam (RESTORIL) capsule 7.5 mg  7.5 mg Oral QHS PRN Charmaine Downs C, NP   7.5 mg at 09/02/22 2051   Current Outpatient Medications  Medication Sig Dispense Refill   acetaminophen (TYLENOL) 500 MG tablet Take 1,000 mg by mouth 3 (three) times daily as needed for moderate pain.     amantadine (SYMMETREL) 100 MG capsule Take 100 mg by mouth 2 (two) times daily.     amLODipine (NORVASC) 5 MG tablet Take 5 mg by mouth in the morning.     apixaban (ELIQUIS) 5 MG TABS tablet Take 1 tablet (5 mg total) by mouth 2 (two)  times daily. 60 tablet 0   divalproex (DEPAKOTE) 500 MG DR tablet Take 1 tablet (500 mg total) by mouth every 12 (twelve) hours. (Patient taking differently: Take 500 mg by mouth 2 (two) times daily.) 60 tablet 0   docusate sodium (COLACE) 100 MG capsule  Take 1 capsule (100 mg total) by mouth 2 (two) times daily. (Patient taking differently: Take 100 mg by mouth in the morning, at noon, and at bedtime.) 60 capsule 0   DULCOLAX 5 MG EC tablet Take 15 mg by mouth at bedtime.     hydrOXYzine (ATARAX) 25 MG tablet Take 25 mg by mouth 3 (three) times daily.     Ibuprofen-diphenhydrAMINE Cit (ADVIL PM PO) Take 3 tablets by mouth at bedtime as needed (sleep).     loratadine (CLARITIN) 10 MG tablet Take 1 tablet (10 mg total) by mouth daily as needed for allergies. (Patient taking differently: Take 10 mg by mouth at bedtime.) 30 tablet 0   losartan (COZAAR) 50 MG tablet Take 1 tablet (50 mg total) by mouth 2 (two) times daily. 60 tablet 0   LUMIGAN 0.01 % SOLN Place 1 drop into both eyes at bedtime. 2.5 mL 0   Multiple Vitamins-Minerals (MULTIVITAMIN WITH MINERALS) tablet Take 1 tablet by mouth daily.     NICORETTE 2 MG gum Take 2 mg by mouth as needed for smoking cessation.     ocrelizumab (OCREVUS) 300 MG/10ML injection Inject 2 vials ( 20 MLs) into the vein every 6 months (Patient taking differently: Inject 600 mg into the vein See admin instructions. Infuse 600 mg every 8 months) 20 mL 0   paliperidone (INVEGA) 9 MG 24 hr tablet Take 9 mg by mouth in the morning.     benztropine (COGENTIN) 1 MG tablet Take 1 tablet (1 mg total) by mouth 2 (two) times daily. (Patient not taking: Reported on 09/01/2022) 60 tablet 0   haloperidol (HALDOL) 10 MG tablet Take 1 tablet (10 mg total) by mouth 2 (two) times daily. (Patient not taking: Reported on 09/01/2022) 60 tablet 0   LORazepam (ATIVAN) 0.5 MG tablet Take 0.5 mg by mouth 2 (two) times daily as needed for anxiety or sedation. (Patient not taking: Reported on  09/01/2022)     nicotine (NICODERM CQ - DOSED IN MG/24 HOURS) 14 mg/24hr patch Place 1 patch (14 mg total) onto the skin daily. (Patient not taking: Reported on 09/01/2022) 28 patch 0   OLANZapine (ZYPREXA) 15 MG tablet Take 1 tablet (15 mg total) by mouth at bedtime. (Patient not taking: Reported on 09/01/2022) 30 tablet 0   temazepam (RESTORIL) 22.5 MG capsule Take 22.5 mg by mouth at bedtime as needed for sleep. (Patient not taking: Reported on 09/01/2022)     topiramate (TOPAMAX) 25 MG tablet TAKE ONE TABLET BY MOUTH TWICE A DAY (Patient not taking: Reported on 09/01/2022) 60 tablet 4    Lab Results:  Results for orders placed or performed during the hospital encounter of 08/31/22 (from the past 48 hour(s))  Valproic acid level     Status: None   Collection Time: 09/06/22  8:54 AM  Result Value Ref Range   Valproic Acid Lvl 52 50.0 - 100.0 ug/mL    Comment: Performed at [facility], [place] [address] [place], [place] [address]    Blood Alcohol level:  Lab Results  Component Value Date   [facility] <10 08/31/2022   ETH <10 07/11/2022    Physical Findings:  CIWA:    COWS:     Musculoskeletal: Strength & Muscle Tone: within normal limits Gait & Station: normal Patient leans: N/A  Psychiatric Specialty Exam:  Presentation  General Appearance:  Casual; Disheveled  Eye Contact: Good  Speech: Clear and Coherent; Slow  Speech Volume: Normal  Handedness: Right  Mood and Affect  Mood: Anxious  Affect: Congruent   Thought Process  Thought Processes: Coherent; Goal Directed; Linear  Descriptions of Associations:Intact  Orientation:Partial  Thought Content:Logical  History of Schizophrenia/Schizoaffective disorder:Yes  Duration of Psychotic Symptoms:Greater than six months  Hallucinations:Hallucinations: Auditory; Visual Description of Auditory Hallucinations: Responding to is but not stating what he is hearing.  Yells out at times. Description  of Visual Hallucinations: looks at walls and yells.   Ideas of Reference:None  Suicidal Thoughts:Suicidal Thoughts: No   Homicidal Thoughts:Homicidal Thoughts: No    Sensorium  Memory: Immediate Fair; Recent Fair; Remote Fair  Judgment: Impaired  Insight: Lacking   Executive Functions  Concentration: Fair  Attention Span: Fair  Recall: [employer] of Knowledge: Fair  Language: Fair   Psychomotor Activity  Psychomotor Activity: Psychomotor Activity: Normal    Assets  Assets: Armed forces logistics/support/administrative officer; Housing; Social Support   Sleep  Sleep: Sleep: Good     Physical Exam: Physical Exam HENT:     Head: Normocephalic.  Eyes:     Pupils: Pupils are equal, round, and reactive to light.  Musculoskeletal:        General: Normal range of motion.  Skin:    General: Skin is warm.  Neurological:     Mental Status: He is alert.  Psychiatric:        Attention and Perception: Attention normal.        Mood and Affect: Mood is anxious.        Speech: Speech is rapid and pressured.        Behavior: Behavior is cooperative.        Thought Content: Thought content is delusional.        Judgment: Judgment is impulsive.    Review of Systems  Constitutional: Negative.   Musculoskeletal: Negative.   Skin: Negative.   Psychiatric/Behavioral:  Positive for hallucinations.    Blood pressure 123/70, pulse 71, temperature 97.7 F (36.5 C), temperature source Oral, resp. rate 16, SpO2 100 %. There is no height or weight on file to calculate BMI.   Medical Decision Making: Patient continues to require inpatient Psychiatric hospitalization.  Patient has been compliant with medications in the ED, behavior has been appropriate and cooperative.  Patient has had less anger outburst today.  Not responding to IS as much as he was doing last week.  Plan to to administer Kirt Boys injection once his outpatient Psychiatrist agrees to give after  discharge. Disposition-Seek inpatient Psychiatry hospitalization. Delfin Gant, NP-PMHNP-BC 09/07/2022, 6:07 PM

## 2022-09-07 NOTE — ED Notes (Signed)
Sudden episode of [place] awaking from sleep jumping OOB punching wall and stated you want to fight me to the wall. When approached by this writer Brandon [name] state "I'm all right" but after walking away for pt room Brandon [name] was overheard talking to self. No injury to pt or property damage was noted and Brandon [name] returned to bed.

## 2022-09-08 DIAGNOSIS — F201 Disorganized schizophrenia: Secondary | ICD-10-CM | POA: Diagnosis not present

## 2022-09-08 MED ORDER — PALIPERIDONE PALMITATE ER 234 MG/1.5ML IM SUSY
234.0000 mg | PREFILLED_SYRINGE | Freq: Once | INTRAMUSCULAR | Status: AC
  Administered 2022-09-08: 234 mg via INTRAMUSCULAR
  Filled 2022-09-08: qty 1.5

## 2022-09-08 NOTE — ED Provider Notes (Signed)
Emergency Medicine Observation Re-evaluation Note  Brandon [name] is a 32 y.o. male, seen on rounds today.  Pt initially presented to the ED for complaints of Hallucinations (SI/HI) Currently, the patient is resting.  Physical Exam  BP 102/65 (BP Location: Right Arm)   Pulse 72   Temp 97.8 F (36.6 C) (Oral)   Resp 18   SpO2 100%  Physical Exam General: nad Cardiac: regular rate Lungs: normal effort Psych:   ED Course / MDM  EKG:EKG Interpretation  Date/Time:  Tuesday September 02 2022 16:55:50 EST Ventricular Rate:  61 PR Interval:  120 QRS Duration: 90 QT Interval:  386 QTC Calculation: 388 R Axis:   30 Text Interpretation: Normal sinus rhythm Normal ECG When compared with ECG of 02-Sep-2022 16:54, PREVIOUS ECG IS PRESENT No significant change since last tracing Confirmed by Fredia Sorrow [phone]) on 09/02/2022 5:07:55 PM  I have reviewed the labs performed to date as well as medications administered while in observation.  Recent changes in the last 24 hours include no events.  Plan  Current plan is possible [place] transfer or possible outpt treatment if sx improving.    Dorie Rank, MD 09/08/22 [phone]

## 2022-09-08 NOTE — Progress Notes (Addendum)
09/08/22 4:50pm, This CSW received a phone call from [name], [place], [place] with [employer]  [phone] advising that he needed more detail in regards to what the endorsement letter for the [facility] entailed. Jeneen Rinks reports that he spoke with his supervisor who shared that they needed more detail. Jeneen Rinks shared that he has been working with pt for a while and was in process of obtaining a placement for him before this most recent hospital stay. Jeneen Rinks requested any updates in regards to medications and overall disposition. Jeneen Rinks shared that the merge of [place] with [employer] is new and some of the referral process have changed/ updated and all parties are learning. CSW inquired if there was support to speak with during 2nd shift and Jeneen Rinks informed that the call center is open however would not be much of assistance because the call center's role is to assist with crisis calls. Jeneen Rinks informed that he would try to assist with this process as much as possible. CSW informed that CSW and Disposition team would work on this request so that assistance with placement can be made.  This CSW completed more research on the [facility] referral and found all needed information on the [facility]'s website. The referral, checklist, and frequent questions were all listed on the [facility] website along with finding that the [facility] is to complete the endorsement letter, which Jeneen Rinks agreed he is aware of but wanted more insight on what needed to be in the letter. CSW sent an email back to Jeneen Rinks sharing the listed information on the [place] website: [email or web address]  Under Frequently Asked Questions: Who submits the completed application to [facility] CMA, AMH+, Providers, and other community stakeholder groups (ie DSS, physician offices) * CSW/ Disposition Team is working to complete this process now.  What is the purpose of  the letter of endorsement? To document support of the admission to a state-operated developmental center Who completes the letter of endorsement? [place] added 1st shift CSW Disposition Denna Haggard, [place] to the email and is awaiting a respond from Jeneen Rinks because [employer] would need to assist this team with completing this referral process. In the email this CSW writes, that CSW and Disposition team is new to this referral process and seeking clarification for completion.   Through further reading CSW finds that the checklist states the following: Letter of Support from [facility]  -Reason for referral -Attempted interventions prior to seeking developmental center - Outcomes expected from this admission -Transition goals: What is needed during admission to facilitate individual's return to the community? -[facility] Signature indicating endorsement of the referral  CSW/ Disposition Team is awaiting follow up from [facility] and will start completing more forms needed for the referral. CSW/ Disposition to seek clarification on the documents that are listed on the checklist from the [place].    Below is LME's Case Manager contact information.   [name], LCSW, [place]* See below for more information! [email or web address] Member & Recipient Service Line: [phone] Provider Support Service Line: [phone] [facility] consolidated with [facility] on Feb. 1, 2024. Learn more here.  This situation is ongoing, CSW/ Disposition Team will continue to assist with placement.    Benjaman Kindler, MSW, [facility] 09/08/2022 11:46 PM

## 2022-09-08 NOTE — Progress Notes (Signed)
[facility] Psych ED Progress Note  09/08/2022 8:12 PM Brandon [name]  MRN:  [id number]   Principal Problem: Schizophrenia, disorganized type [facility]) Diagnosis:  Principal Problem:   Schizophrenia, disorganized type ([place])  Subjective:  Patient has remained calm today with less episodes of yelling out.  Patient had an episode last night where he woke up and jumped out of bed and punched the wall.  Occasionally talks to self but no threats made.  Invega long acting injection 234 mg given today after EKG was gotten.  QTC interval was 386.  Plan is to discharge patient home to his mother/Legal Guardian.   ED Assessment Time Calculation: Start Time: 2000 Stop Time: 2011 Total Time in Minutes (Assessment Completion): [place] Scale:  [place] ED from 08/31/2022 in [facility] Emergency Department at [facility] ED from 08/12/2022 in [facility] Emergency Department at [facility] ED from 08/04/2022 in [facility] [place] High Risk Error: Q3, 4, or 5 should not be populated when Q2 is No Error: Question 6 not populated       Past Medical History:  Past Medical History:  Diagnosis Date   ADHD (attention deficit hyperactivity disorder)    Bipolar 1 disorder (HCC)    Chronic back pain    Chronic constipation    Chronic neck pain    Hypertension    Multiple sclerosis ([place]) 05/20/2013   left sided weakness, dysarthria   Non-compliance    Obesity    Pulmonary embolism (HCC)    Schizophrenia (HCC)    Stroke ([place])    left sided deficits - pt's mother denies this    Past Surgical History:  Procedure Laterality Date   NO PAST SURGERIES     None     RADIOLOGY WITH ANESTHESIA N/A 01/16/2021   Procedure: MRI WITH ANESTHESIA CERVICAL AND THORASIC SPINE WITH AND WITHOUT CONTRAST;  Surgeon: Radiologist, Medication, MD;  Location: [place];  Service: Radiology;  Laterality: N/A;   TOOTH EXTRACTION N/A 06/24/2019   Procedure: DENTAL  RESTORATION/EXTRACTION OF TEETH NUMBER ONE, SIXTEEN, SEVENTEEN, NINETEEN, THIRTY-TWO;  Surgeon: Diona Browner, DDS;  Location: [place];  Service: Oral Surgery;  Laterality: N/A;   Family History:  Family History  Problem Relation Age of Onset   Diabetes Mother    ADD / ADHD Brother     Social History:  Social History   Substance and Sexual Activity  Alcohol Use Not Currently   Alcohol/week: 0.0 standard drinks of alcohol   Comment: "A little bit"      Social History   Substance and Sexual Activity  Drug Use Not Currently   Types: Marijuana   Comment: Last used: unknown     Social History   Socioeconomic History   Marital status: Single    Spouse name: Not on file   Number of children: 0   Years of education: 11th   Highest education level: Not on file  Occupational History   Occupation: unemployed    Employer: [employer]    Comment: Disbaled  Tobacco Use   Smoking status: Every Day    Packs/day: 0.25    Types: Cigarettes   Smokeless tobacco: Never   Tobacco comments:    2 cigarettes a day  Vaping Use   Vaping Use: Never used  Substance and Sexual Activity   Alcohol use: Not Currently    Alcohol/week: 0.0 standard drinks of alcohol    Comment: "A  little bit"    Drug use: Not Currently    Types: Marijuana    Comment: Last used: unknown    Sexual activity: Not on file  Other Topics Concern   Not on file  Social History Narrative   Patient lives at home with his mother.   Disabled.   Education 11 th grade .   Right handed.   Drinks caffeine occassionally   Social Determinants of Radio broadcast assistant Strain: Not on file  Food Insecurity: Not on file  Transportation Needs: Not on file  Physical Activity: Not on file  Stress: Not on file  Social Connections: Not on file    Sleep: Good  Appetite:  Good  Current Medications: Current Facility-Administered Medications  Medication Dose Route Frequency Provider Last Rate Last Admin    amantadine (SYMMETREL) capsule 100 mg  100 mg Oral BID Kommor, Madison, MD   100 mg at 09/08/22 1128   amLODipine (NORVASC) tablet 5 mg  5 mg Oral q AM Kommor, Madison, MD   5 mg at 09/08/22 1128   apixaban (ELIQUIS) tablet 5 mg  5 mg Oral BID Kommor, Madison, MD   5 mg at 09/08/22 1128   divalproex (DEPAKOTE) DR tablet 500 mg  500 mg Oral Q1200 Joelly Bolanos C, NP   500 mg at 09/08/22 1127   divalproex (DEPAKOTE) DR tablet 750 mg  750 mg Oral QHS Rickiya Picariello C, NP   750 mg at 09/07/22 2221   OLANZapine zydis (ZYPREXA) disintegrating tablet 10 mg  10 mg Oral Q8H PRN Motley-Mangrum, Jadeka A, PMHNP   10 mg at 09/06/22 2104   And   LORazepam (ATIVAN) tablet 1 mg  1 mg Oral PRN Motley-Mangrum, Jadeka A, PMHNP       losartan (COZAAR) tablet 50 mg  50 mg Oral BID Kommor, Madison, MD   50 mg at 09/08/22 1128   nicotine (NICODERM CQ - dosed in mg/24 hours) patch 14 mg  14 mg Transdermal Daily Kommor, Madison, MD   14 mg at 09/08/22 1133   nicotine polacrilex (NICORETTE) gum 2 mg  2 mg Oral PRN Kommor, Madison, MD       paliperidone (INVEGA) 24 hr tablet 9 mg  9 mg Oral Daily Motley-Mangrum, Jadeka A, PMHNP   9 mg at 09/08/22 1127   polyethylene glycol (MIRALAX / GLYCOLAX) packet 17 g  17 g Oral Daily PRN Cristie Hem, MD   17 g at 09/05/22 1336   temazepam (RESTORIL) capsule 7.5 mg  7.5 mg Oral QHS PRN Charmaine Downs C, NP   7.5 mg at 09/07/22 2222   Current Outpatient Medications  Medication Sig Dispense Refill   acetaminophen (TYLENOL) 500 MG tablet Take 1,000 mg by mouth 3 (three) times daily as needed for moderate pain.     amantadine (SYMMETREL) 100 MG capsule Take 100 mg by mouth 2 (two) times daily.     amLODipine (NORVASC) 5 MG tablet Take 5 mg by mouth in the morning.     apixaban (ELIQUIS) 5 MG TABS tablet Take 1 tablet (5 mg total) by mouth 2 (two) times daily. 60 tablet 0   divalproex (DEPAKOTE) 500 MG DR tablet Take 1 tablet (500 mg total) by mouth every 12 (twelve)  hours. (Patient taking differently: Take 500 mg by mouth 2 (two) times daily.) 60 tablet 0   docusate sodium (COLACE) 100 MG capsule Take 1 capsule (100 mg total) by mouth 2 (two) times daily. (Patient taking differently: Take  100 mg by mouth in the morning, at noon, and at bedtime.) 60 capsule 0   DULCOLAX 5 MG EC tablet Take 15 mg by mouth at bedtime.     hydrOXYzine (ATARAX) 25 MG tablet Take 25 mg by mouth 3 (three) times daily.     Ibuprofen-diphenhydrAMINE Cit (ADVIL PM PO) Take 3 tablets by mouth at bedtime as needed (sleep).     loratadine (CLARITIN) 10 MG tablet Take 1 tablet (10 mg total) by mouth daily as needed for allergies. (Patient taking differently: Take 10 mg by mouth at bedtime.) 30 tablet 0   losartan (COZAAR) 50 MG tablet Take 1 tablet (50 mg total) by mouth 2 (two) times daily. 60 tablet 0   LUMIGAN 0.01 % SOLN Place 1 drop into both eyes at bedtime. 2.5 mL 0   Multiple Vitamins-Minerals (MULTIVITAMIN WITH MINERALS) tablet Take 1 tablet by mouth daily.     NICORETTE 2 MG gum Take 2 mg by mouth as needed for smoking cessation.     ocrelizumab (OCREVUS) 300 MG/10ML injection Inject 2 vials ( 20 MLs) into the vein every 6 months (Patient taking differently: Inject 600 mg into the vein See admin instructions. Infuse 600 mg every 8 months) 20 mL 0   paliperidone (INVEGA) 9 MG 24 hr tablet Take 9 mg by mouth in the morning.     benztropine (COGENTIN) 1 MG tablet Take 1 tablet (1 mg total) by mouth 2 (two) times daily. (Patient not taking: Reported on 09/01/2022) 60 tablet 0   haloperidol (HALDOL) 10 MG tablet Take 1 tablet (10 mg total) by mouth 2 (two) times daily. (Patient not taking: Reported on 09/01/2022) 60 tablet 0   LORazepam (ATIVAN) 0.5 MG tablet Take 0.5 mg by mouth 2 (two) times daily as needed for anxiety or sedation. (Patient not taking: Reported on 09/01/2022)     nicotine (NICODERM CQ - DOSED IN MG/24 HOURS) 14 mg/24hr patch Place 1 patch (14 mg total) onto the skin daily.  (Patient not taking: Reported on 09/01/2022) 28 patch 0   OLANZapine (ZYPREXA) 15 MG tablet Take 1 tablet (15 mg total) by mouth at bedtime. (Patient not taking: Reported on 09/01/2022) 30 tablet 0   temazepam (RESTORIL) 22.5 MG capsule Take 22.5 mg by mouth at bedtime as needed for sleep. (Patient not taking: Reported on 09/01/2022)     topiramate (TOPAMAX) 25 MG tablet TAKE ONE TABLET BY MOUTH TWICE A DAY (Patient not taking: Reported on 09/01/2022) 60 tablet 4    Lab Results:  No results found for this or any previous visit (from the past 48 hour(s)).   Blood Alcohol level:  Lab Results  Component Value Date   ETH <10 08/31/2022   ETH <10 07/11/2022    Physical Findings:  CIWA:    COWS:     Musculoskeletal: Strength & Muscle Tone: within normal limits Gait & Station: normal Patient leans: N/A  Psychiatric Specialty Exam:  Presentation  General Appearance:  Casual; Disheveled  Eye Contact: Good  Speech: Clear and Coherent; Slow  Speech Volume: Normal  Handedness: Right   Mood and Affect  Mood: Anxious  Affect: Congruent   Thought Process  Thought Processes: Coherent; Goal Directed; Linear  Descriptions of Associations:Intact  Orientation:Partial  Thought Content:Logical  History of Schizophrenia/Schizoaffective disorder:Yes  Duration of Psychotic Symptoms:Greater than six months  Hallucinations:Hallucinations: Auditory; Visual Description of Auditory Hallucinations: occasionally talks to self or unseen people. Description of Visual Hallucinations: less yeslling out looking at wall.  Ideas of Reference:None  Suicidal Thoughts:Suicidal Thoughts: No   Homicidal Thoughts:Homicidal Thoughts: No    Sensorium  Memory: Immediate Fair; Recent Fair; Remote Fair  Judgment: Impaired  Insight: Lacking   Executive Functions  Concentration: Fair  Attention Span: Fair  Recall: [place] of  Knowledge: Good  Language: Fair   Psychomotor Activity  Psychomotor Activity: Psychomotor Activity: Normal    Assets  Assets: Communication Skills; Housing   Sleep  Sleep: Sleep: Good     Physical Exam: Physical Exam HENT:     Head: Normocephalic.  Eyes:     Pupils: Pupils are equal, round, and reactive to light.  Musculoskeletal:        General: Normal range of motion.  Skin:    General: Skin is warm.  Neurological:     Mental Status: He is alert.  Psychiatric:        Attention and Perception: Attention normal.        Behavior: Behavior is cooperative.        Judgment: Judgment is impulsive.    Review of Systems  Constitutional: Negative.   Musculoskeletal: Negative.   Skin: Negative.   Psychiatric/Behavioral:  Positive for hallucinations.    Blood pressure 109/71, pulse 72, temperature 97.9 F (36.6 C), temperature source Oral, resp. rate 18, SpO2 100 %. There is no height or weight on file to calculate BMI.   Medical Decision Making: Patient continues to require inpatient Psychiatric hospitalization.  Patient has been compliant with medications in the ED, behavior has been appropriate and cooperative.  Patient has had less anger outburst today.  He received LAI Invega 234 mg today and tolerated same well.  Plan is to discharge tomorrow. Disposition- Discharge to mother tomorrow,. Delfin Gant, NP-PMHNP-BC 09/08/2022, 8:12 PM

## 2022-09-08 NOTE — Group Note (Unsigned)
Date:  09/08/2022 Time:  10:23 AM  Group Topic/Focus:  Goals Group:   The focus of this group is to help patients establish daily goals to achieve during treatment and discuss how the patient can incorporate goal setting into their daily lives to aide in recovery. Orientation:   The focus of this group is to educate the patient on the purpose and policies of crisis stabilization and provide a format to answer questions about their admission.  The group details unit policies and expectations of patients while admitted.     Participation Level:  {BHH PARTICIPATION WO:[id number]  Participation Quality:  {BHH PARTICIPATION QUALITY:22265}  Affect:  {BHH AFFECT:22266}  Cognitive:  {BHH COGNITIVE:22267}  Insight: {BHH Insight2:20797}  Engagement in Group:  {BHH ENGAGEMENT IN BP:[id number]  Modes of Intervention:  {BHH MODES OF INTERVENTION:22269}  Additional Comments:  ***  Dub Mikes 09/08/2022, 10:23 AM

## 2022-09-09 ENCOUNTER — Other Ambulatory Visit (HOSPITAL_COMMUNITY): Payer: Self-pay

## 2022-09-09 DIAGNOSIS — F201 Disorganized schizophrenia: Secondary | ICD-10-CM | POA: Diagnosis not present

## 2022-09-09 MED ORDER — DIVALPROEX SODIUM 250 MG PO DR TAB: 750.0000 mg | DELAYED_RELEASE_TABLET | Freq: Every day | ORAL | 0 refills | Status: AC

## 2022-09-09 MED ORDER — PALIPERIDONE ER 9 MG PO TB24: 9.0000 mg | ORAL_TABLET | Freq: Every day | ORAL | 0 refills | Status: DC

## 2022-09-09 MED ORDER — TEMAZEPAM 7.5 MG PO CAPS: 7.5000 mg | ORAL_CAPSULE | Freq: Every evening | ORAL | 0 refills | Status: DC | PRN

## 2022-09-09 MED ORDER — PALIPERIDONE PALMITATE ER 156 MG/ML IM SUSY: 156.0000 mg | PREFILLED_SYRINGE | Freq: Once | INTRAMUSCULAR | 0 refills | Status: DC

## 2022-09-09 MED ORDER — DIVALPROEX SODIUM 500 MG PO DR TAB: 500.0000 mg | DELAYED_RELEASE_TABLET | Freq: Every day | ORAL | 0 refills | Status: AC

## 2022-09-09 MED ORDER — PALIPERIDONE PALMITATE ER 156 MG/ML IM SUSY: 156.0000 mg | PREFILLED_SYRINGE | Freq: Once | INTRAMUSCULAR | Status: DC

## 2022-09-09 NOTE — Discharge Summary (Signed)
[facility] Psych ED Discharge  09/09/2022 12:21 PM CASTLE DOUGALL  MRN:  [id number]  Principal Problem: Schizophrenia, disorganized type [facility]) Discharge Diagnoses: Principal Problem:   Schizophrenia, disorganized type [facility])  Clinical Impression:  Final diagnoses:  Auditory hallucinations   Subjective: Brandon [name], 32 y.o., male patient seen face to face by this provider, consulted with Dr. Dwyane Dee; and chart reviewed on 09/04/22.  Patient admitted with previous hx of Disorganized Schizophrenia and Bipolar disorder brought in by his grandmother for Auditory hallucination and suicide ideation On arrival to the ER Patient was severely psychotic yelling, cursing at unseen people.  He was anxious and occasionally thought people were out to get him.  He threatened staff members  and he was pacing in his rounding and mumbling words to himself. His Psychotropic medications were resumed at the time Haldol- Amantadine and Depakote was also added on the third days at the request of his mother.  His mother requested Depakote to be prescribed stating he is not allergic to Depakote but in a high dose he does experience Hypersalivation.  Same was added.  Restoril was started on arrival at 22.5 mg at night but same made him very drowsy and was decreased to 7.5 mg at night.  He has been sleeping well at this dose.  His last Depakote level was was 52 on 09/06/22 and Depakote was increased to 500 mg in am and 750 at bed time.  Patient was switched from Haldol to Paliperidone at his request as he  was not getting better.  He continued to yell out, scream, responding to IS and and suspicious of people.  After five days of oral Paliperidone on 9 mg his received LAI Invega Sustenna 234 mg yesterday.  QTC interval yesterday before injection was 386.  Patient is given a prescription of Invega Sustenna 156 mg LAI on March 8th 2024. This morning patient was seen on rounds and we reviewed safety plan and discussed medication  compliance.  Patient reports that he feels Invega id better than Haldol.  He is still responding to IS but not as much as before starting Invega tablet.  He denies SI/HI . He has been sleeping and eating well.  He needs to be reminded to care for his ADLS. Patient will continue to see DR Akintayo his Psychiatrist.  Patient is Psychiatrically cleared.  ED Assessment Time Calculation: Start Time: 1153 Stop Time: 1220 Total Time in Minutes (Assessment Completion): 27   Past Psychiatric History: see initial psychiatric note  Past Medical History:  Past Medical History:  Diagnosis Date   ADHD (attention deficit hyperactivity disorder)    Bipolar 1 disorder (HCC)    Chronic back pain    Chronic constipation    Chronic neck pain    Hypertension    Multiple sclerosis ([place]) 05/20/2013   left sided weakness, dysarthria   Non-compliance    Obesity    Pulmonary embolism ([place])    Schizophrenia (HCC)    Stroke ([place])    left sided deficits - pt's mother denies this    Past Surgical History:  Procedure Laterality Date   NO PAST SURGERIES     None     RADIOLOGY WITH ANESTHESIA N/A 01/16/2021   Procedure: MRI WITH ANESTHESIA CERVICAL AND THORASIC SPINE WITH AND WITHOUT CONTRAST;  Surgeon: Radiologist, Medication, MD;  Location: [place];  Service: Radiology;  Laterality: N/A;   TOOTH EXTRACTION N/A 06/24/2019   Procedure: DENTAL RESTORATION/EXTRACTION OF TEETH NUMBER ONE, SIXTEEN, SEVENTEEN, NINETEEN, THIRTY-TWO;  Surgeon: Diona Browner, DDS;  Location: [place];  Service: Oral Surgery;  Laterality: N/A;   Family History:  Family History  Problem Relation Age of Onset   Diabetes Mother    ADD / ADHD Brother    Family Psychiatric  History: see initial psychiatric note Social History:  Social History   Substance and Sexual Activity  Alcohol Use Not Currently   Alcohol/week: 0.0 standard drinks of alcohol   Comment: "A little bit"      Social History   Substance and Sexual Activity  Drug  Use Not Currently   Types: Marijuana   Comment: Last used: unknown     Social History   Socioeconomic History   Marital status: Single    Spouse name: Not on file   Number of children: 0   Years of education: 11th   Highest education level: Not on file  Occupational History   Occupation: unemployed    Employer: [employer]    Comment: Disbaled  Tobacco Use   Smoking status: Every Day    Packs/day: 0.25    Types: Cigarettes   Smokeless tobacco: Never   Tobacco comments:    2 cigarettes a day  Vaping Use   Vaping Use: Never used  Substance and Sexual Activity   Alcohol use: Not Currently    Alcohol/week: 0.0 standard drinks of alcohol    Comment: "A little bit"    Drug use: Not Currently    Types: Marijuana    Comment: Last used: unknown    Sexual activity: Not on file  Other Topics Concern   Not on file  Social History Narrative   Patient lives at home with his mother.   Disabled.   Education 11 th grade .   Right handed.   Drinks caffeine occassionally   Social Determinants of Radio broadcast assistant Strain: Not on file  Food Insecurity: Not on file  Transportation Needs: Not on file  Physical Activity: Not on file  Stress: Not on file  Social Connections: Not on file    Tobacco Cessation:  A prescription for an FDA-approved tobacco cessation medication was offered at discharge and the patient refused  Current Medications: Current Facility-Administered Medications  Medication Dose Route Frequency Provider Last Rate Last Admin   amantadine (SYMMETREL) capsule 100 mg  100 mg Oral BID Kommor, Madison, MD   100 mg at 09/09/22 1005   amLODipine (NORVASC) tablet 5 mg  5 mg Oral q AM Kommor, Madison, MD   5 mg at 09/09/22 1005   apixaban (ELIQUIS) tablet 5 mg  5 mg Oral BID Kommor, Madison, MD   5 mg at 09/09/22 1006   divalproex (DEPAKOTE) DR tablet 500 mg  500 mg Oral Q1200 Dailyn Reith C, NP   500 mg at 09/08/22 1127   divalproex (DEPAKOTE) DR  tablet 750 mg  750 mg Oral QHS Syrah Daughtrey C, NP   750 mg at 09/08/22 2233   losartan (COZAAR) tablet 50 mg  50 mg Oral BID Kommor, Madison, MD   50 mg at 09/09/22 1006   nicotine (NICODERM CQ - dosed in mg/24 hours) patch 14 mg  14 mg Transdermal Daily Kommor, Madison, MD   14 mg at 09/09/22 1006   nicotine polacrilex (NICORETTE) gum 2 mg  2 mg Oral PRN Kommor, Madison, MD       OLANZapine zydis (ZYPREXA) disintegrating tablet 10 mg  10 mg Oral Q8H PRN Motley-Mangrum, Jadeka A, PMHNP   10 mg  at 09/09/22 1005   [START ON 09/12/2022] paliperidone (INVEGA SUSTENNA) injection 156 mg  156 mg Intramuscular Once Maclovio Henson C, NP       paliperidone (INVEGA) 24 hr tablet 9 mg  9 mg Oral Daily Motley-Mangrum, Jadeka A, PMHNP   9 mg at 09/09/22 1005   polyethylene glycol (MIRALAX / GLYCOLAX) packet 17 g  17 g Oral Daily PRN Cristie Hem, MD   17 g at 09/05/22 1336   temazepam (RESTORIL) capsule 7.5 mg  7.5 mg Oral QHS PRN Charmaine Downs C, NP   7.5 mg at 09/08/22 2233   Current Outpatient Medications  Medication Sig Dispense Refill   acetaminophen (TYLENOL) 500 MG tablet Take 1,000 mg by mouth 3 (three) times daily as needed for moderate pain.     amantadine (SYMMETREL) 100 MG capsule Take 100 mg by mouth 2 (two) times daily.     amLODipine (NORVASC) 5 MG tablet Take 5 mg by mouth in the morning.     apixaban (ELIQUIS) 5 MG TABS tablet Take 1 tablet (5 mg total) by mouth 2 (two) times daily. 60 tablet 0   docusate sodium (COLACE) 100 MG capsule Take 1 capsule (100 mg total) by mouth 2 (two) times daily. (Patient taking differently: Take 100 mg by mouth in the morning, at noon, and at bedtime.) 60 capsule 0   DULCOLAX 5 MG EC tablet Take 15 mg by mouth at bedtime.     hydrOXYzine (ATARAX) 25 MG tablet Take 25 mg by mouth 3 (three) times daily.     loratadine (CLARITIN) 10 MG tablet Take 1 tablet (10 mg total) by mouth daily as needed for allergies. (Patient taking differently: Take 10 mg  by mouth at bedtime.) 30 tablet 0   losartan (COZAAR) 50 MG tablet Take 1 tablet (50 mg total) by mouth 2 (two) times daily. 60 tablet 0   LUMIGAN 0.01 % SOLN Place 1 drop into both eyes at bedtime. 2.5 mL 0   Multiple Vitamins-Minerals (MULTIVITAMIN WITH MINERALS) tablet Take 1 tablet by mouth daily.     NICORETTE 2 MG gum Take 2 mg by mouth as needed for smoking cessation.     ocrelizumab (OCREVUS) 300 MG/10ML injection Inject 2 vials ( 20 MLs) into the vein every 6 months (Patient taking differently: Inject 600 mg into the vein See admin instructions. Infuse 600 mg every 8 months) 20 mL 0   divalproex (DEPAKOTE) 250 MG DR tablet Take 3 tablets (750 mg total) by mouth at bedtime. 90 tablet 0   divalproex (DEPAKOTE) 500 MG DR tablet Take 1 tablet (500 mg total) by mouth daily at 12 noon. 30 tablet 0   [START ON 09/12/2022] paliperidone (INVEGA SUSTENNA) 156 MG/ML SUSY injection Inject 1 mL (156 mg total) into the muscle once for 1 dose. 1 mL 0   [START ON 09/10/2022] paliperidone (INVEGA) 9 MG 24 hr tablet Take 1 tablet (9 mg total) by mouth daily for 7 days. 7 tablet 0   temazepam (RESTORIL) 7.5 MG capsule Take 1 capsule (7.5 mg total) by mouth at bedtime as needed for sleep. 30 capsule 0   topiramate (TOPAMAX) 25 MG tablet TAKE ONE TABLET BY MOUTH TWICE A DAY (Patient not taking: Reported on 09/01/2022) 60 tablet 4   PTA Medications: (Not in a hospital admission)   [place] Scale:  [place] ED from 08/31/2022 in [facility] Emergency Department at [facility] ED from [facility]/12/2022 in [facility] Emergency Department at [facility]  Hospital ED from 08/04/2022 in [facility] [place] High Risk Error: Q3, 4, or 5 should not be populated when Q2 is No Error: Question 6 not populated       Musculoskeletal: Strength & Muscle Tone: within normal limits Gait & Station: normal Patient leans: Front  Psychiatric Specialty Exam: Presentation  General  Appearance:  Casual; Neat  Eye Contact: Good  Speech: Normal Rate; Clear and Coherent  Speech Volume: Normal  Handedness: Right   Mood and Affect  Mood: Euthymic  Affect: Congruent   Thought Process  Thought Processes: Coherent; Goal Directed; Linear  Descriptions of Associations:Intact  Orientation:Partial  Thought Content:Logical  History of Schizophrenia/Schizoaffective disorder:Yes  Duration of Psychotic Symptoms:Greater than six months  Hallucinations:Hallucinations: Auditory; Visual (Decreased episodes of AVH-  Occasionally responds to IS and seeing thing.) Description of Auditory Hallucinations: occasionally talks to self or unseen people. Description of Visual Hallucinations: less yeslling out looking at wall.  Ideas of Reference:None  Suicidal Thoughts:Suicidal Thoughts: No  Homicidal Thoughts:Homicidal Thoughts: No   Sensorium  Memory: Immediate Fair; Remote Fair  Judgment: Fair  Insight: Fair   Community education officer  Concentration: Fair  Attention Span: Fair  Recall: [employer] of Knowledge: Fair  Language: Fair   Psychomotor Activity  Psychomotor Activity: Psychomotor Activity: Normal   Assets  Assets: Armed forces logistics/support/administrative officer; Housing; Social Support; Desire for Improvement   Sleep  Sleep: Sleep: Good    Physical Exam: Physical Exam ROS Blood pressure 109/61, pulse 74, temperature 98 F (36.7 C), temperature source Oral, resp. rate 18, SpO2 100 %. There is no height or weight on file to calculate BMI.   Demographic Factors:  Male, Adolescent or young adult, Low socioeconomic status, and Unemployed  Loss Factors: NA  Historical Factors: Impulsivity  Risk Reduction Factors:   Living with another person, especially a relative and Positive social support  Continued Clinical Symptoms:  Schizophrenia:   Paranoid or undifferentiated type  Cognitive Features That Contribute To Risk:  None    Suicide  Risk:  Minimal: No identifiable suicidal ideation.  Patients presenting with no risk factors but with morbid ruminations; may be classified as minimal risk based on the severity of the depressive symptoms    Plan Of Care/Follow-up recommendations:  Activity:  as tolerated Diet:  Regular  Medical Decision Making: Patient is stable at base line.  He denies SI/HI.  He will continue treatment in the outpatient setting with DR Akintayo.  Patient is cleared for discharge.    Disposition: Psychiatrically cleared.  Delfin Gant, NP-PMHNP-BC 09/09/2022, 12:21 PM

## 2022-09-09 NOTE — Progress Notes (Signed)
Per review of notes listed by provider Charmaine Downs, NP; 1st shift CSW to follow up with [facility] about bed availability at [facility] due to provider note Attestation Melba Coon, MD reports "Patient is interested in starting a long-acting injectable antipsychotic.  It is reasonable to ensure that patient is administered the correct LAI that can be continued by his outpatient prescribing psychiatrist. Patient is able to contract for safety while in the emergency room. Alternately, he could be moved to the behavioral health urgent care while awaiting contact his psychiatrist on 09/08/2022. His symptoms are gradually improving."   There is no [facility] for this shift, and 1st shift CSW will need to follow up.   -CSW sent inpatient behavioral health referral to out of network providers, situation is ongoing.    Brandon [name], MSW, LCSWA 09/09/2022 12:04 AM

## 2022-09-09 NOTE — ED Provider Notes (Signed)
Emergency Medicine Observation Re-evaluation Note  Brandon [name] is a 32 y.o. male, seen on rounds today.  Pt initially presented to the ED for complaints of Hallucinations (SI/HI) Currently, the patient is not having any new complaints.  Physical Exam  BP 109/61 (BP Location: Left Arm)   Pulse 74   Temp 98 F (36.7 C) (Oral)   Resp 18   SpO2 100%  Physical Exam General: Sitting on stretcher dancing Lungs: Normal WOB Psych: Calm  ED Course / MDM  EKG:EKG Interpretation  Date/Time:  Tuesday September 02 2022 16:55:50 EST Ventricular Rate:  61 PR Interval:  120 QRS Duration: 90 QT Interval:  386 QTC Calculation: 388 R Axis:   30 Text Interpretation: Normal sinus rhythm Normal ECG When compared with ECG of 02-Sep-2022 16:54, PREVIOUS ECG IS PRESENT No significant change since last tracing Confirmed by Fredia Sorrow [phone]) on 09/02/2022 5:07:55 PM  I have reviewed the labs performed to date as well as medications administered while in observation.  Recent changes in the last 24 hours include patient received Invega yesterday with improvement of his aggression.  Psychiatry anticipates discharge today.  Plan  Current plan is for discharge to mother later today if patient remains calm.  May require outpatient follow-up for long acting antipsychotic injections potentially from [facility] initially.    Fransico Meadow, MD 09/09/22 [phone]

## 2022-09-09 NOTE — ED Notes (Addendum)
Patient cousin is here to pick him up for discharge, called x8 to bring patient out to lobby or "they will leave him here". MD Philip Aspen) called to evaluate patient before discharge. MD with medical patients at this time. Charge informed of pending discharge.

## 2022-09-09 NOTE — ED Notes (Signed)
Awoke once requesting water then returned to sleep without difficulty.

## 2022-09-09 NOTE — ED Notes (Signed)
Pt sleeping with no signs of distress or sleep disturbance respirations are easy.

## 2022-10-29 ENCOUNTER — Other Ambulatory Visit (HOSPITAL_COMMUNITY): Payer: Self-pay

## 2022-11-27 DIAGNOSIS — R7303 Prediabetes: Secondary | ICD-10-CM | POA: Insufficient documentation

## 2022-12-01 ENCOUNTER — Ambulatory Visit (HOSPITAL_COMMUNITY)
Admission: EM | Admit: 2022-12-01 | Discharge: 2022-12-01 | Disposition: A | Discharge status: Home / Self Care | Payer: 59 | Attending: Family Medicine | Admitting: Family Medicine

## 2022-12-01 ENCOUNTER — Encounter (HOSPITAL_COMMUNITY): Payer: Self-pay

## 2022-12-01 DIAGNOSIS — H6691 Otitis media, unspecified, right ear: Secondary | ICD-10-CM | POA: Insufficient documentation

## 2022-12-01 DIAGNOSIS — J069 Acute upper respiratory infection, unspecified: Secondary | ICD-10-CM | POA: Diagnosis present

## 2022-12-01 DIAGNOSIS — Z1152 Encounter for screening for COVID-19: Secondary | ICD-10-CM | POA: Diagnosis not present

## 2022-12-01 MED ORDER — AMOXICILLIN 875 MG PO TABS: 875.0000 mg | ORAL_TABLET | Freq: Two times a day (BID) | ORAL | 0 refills | Status: AC

## 2022-12-01 MED ORDER — BENZONATATE 100 MG PO CAPS: 100.0000 mg | ORAL_CAPSULE | Freq: Three times a day (TID) | ORAL | 0 refills | Status: DC | PRN

## 2022-12-01 NOTE — Discharge Instructions (Signed)
Take amoxicillin 875 mg--1 tab twice daily for 7 days  Take benzonatate 100 mg, 1 tab every 8 hours as needed for cough.   You have been swabbed for COVID, and the test will result in the next 24 hours. Our staff will call you if positive. If the COVID test is positive, you should quarantine until you are fever free for 24 hours and you are starting to feel better, and then take added precautions for the next 5 days, such as physical distancing/wearing a mask and good hand hygiene/washing.

## 2022-12-01 NOTE — ED Provider Notes (Signed)
[facility]    CSN: [id number] Arrival date & time: 12/01/22  1100      History   Chief Complaint Chief Complaint  Patient presents with   Cough   Nasal Congestion    HPI Brandon [name] is a 32 y.o. male.    Cough  Here for cough and congestion that began on May 23.  His nose was stopped up at first but it is now it is stopped up now.  He is bringing up some clear mucus when he coughs.  No fever or chills and no nausea or vomiting  He has not felt short of breath and he has not been wheezing  Of note, he takes Eliquis.  He is on immunosuppressive medication for his multiple sclerosis.  His right ear began bothering him last night.  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   Aggressive behavior 08/13/2021   Acute alcohol intoxication (HCC) 07/17/2021   Schizophrenia, 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HCC) 03/05/2021   Unspecified intellectual disabilities 01/27/2020   Migraine headache 04/18/2019   Generalized weakness    Multiple sclerosis (HCC) 05/20/2013   HTN (hypertension) 07/04/2011   Ataxia 07/02/2011   Obesity, Class III, BMI 40-49.9 (morbid obesity) (HCC) 01/14/2010   Attention deficit hyperactivity disorder (ADHD) 01/14/2010    Past Surgical History:  Procedure Laterality Date   NO PAST SURGERIES     None     RADIOLOGY WITH ANESTHESIA N/A 01/16/2021   Procedure: MRI WITH ANESTHESIA CERVICAL AND THORASIC SPINE WITH AND WITHOUT CONTRAST;   Surgeon: Radiologist, Medication, MD;  Location: [facility] OR;  Service: Radiology;  Laterality: N/A;   TOOTH EXTRACTION N/A 06/24/2019   Procedure: DENTAL RESTORATION/EXTRACTION OF TEETH NUMBER ONE, SIXTEEN, SEVENTEEN, NINETEEN, THIRTY-TWO;  Surgeon: Ocie Doyne, DDS;  Location: [facility] OR;  Service: Oral Surgery;  Laterality: N/A;       Home Medications    Prior to Admission medications   Medication Sig Start Date End Date Taking? Authorizing Provider  amantadine (SYMMETREL) 100 MG capsule Take 100 mg by mouth 2 (two) times daily. 09/25/21  Yes [provider]  amLODipine (NORVASC) 5 MG tablet Take 5 mg by mouth in the morning. 03/17/22  Yes [provider]  amoxicillin (AMOXIL) 875 MG tablet Take 1 tablet (875 mg total) by mouth 2 (two) times daily for 7 days. 12/01/22 12/08/22 Yes Miray Mancino, Janace Aris, MD  apixaban (ELIQUIS) 5 MG TABS tablet Take 1 tablet (5 mg total) by mouth 2 (two) times daily. 11/01/21  Yes Clapacs, Jackquline Denmark, MD  benzonatate (TESSALON) 100 MG capsule Take 1 capsule (100 mg total) by mouth 3 (three) times daily as needed for cough. 12/01/22  Yes Zenia Resides, MD  acetaminophen (TYLENOL) 500 MG tablet Take 1,000 mg by mouth 3 (three) times daily as needed for moderate pain.    [provider]  divalproex (DEPAKOTE) 250 MG DR tablet  Take 3 tablets (750 mg total) by mouth at bedtime. 09/09/22 10/09/22  Earney Navy, NP  divalproex (DEPAKOTE) 500 MG DR tablet Take 1 tablet (500 mg total) by mouth daily at 12 noon. 09/09/22 10/09/22  Earney Navy, NP  docusate sodium (COLACE) 100 MG capsule Take 1 capsule (100 mg total) by mouth 2 (two) times daily. Patient taking differently: Take 100 mg by mouth in the morning, at noon, and at bedtime. 11/01/21   Clapacs, Jackquline Denmark, MD  DULCOLAX 5 MG EC tablet Take 15 mg by mouth at bedtime.    [provider]  hydrOXYzine (ATARAX) 25 MG tablet Take 25 mg by mouth 3 (three) times daily.    [provider]   loratadine (CLARITIN) 10 MG tablet Take 1 tablet (10 mg total) by mouth daily as needed for allergies. Patient taking differently: Take 10 mg by mouth at bedtime. 11/01/21   Clapacs, Jackquline Denmark, MD  losartan (COZAAR) 50 MG tablet Take 1 tablet (50 mg total) by mouth 2 (two) times daily. 11/01/21   Clapacs, Jackquline Denmark, MD  LUMIGAN 0.01 % SOLN Place 1 drop into both eyes at bedtime. 11/01/21   Clapacs, Jackquline Denmark, MD  Multiple Vitamins-Minerals (MULTIVITAMIN WITH MINERALS) tablet Take 1 tablet by mouth daily.    [provider]  NICORETTE 2 MG gum Take 2 mg by mouth as needed for smoking cessation.    [provider]  ocrelizumab (OCREVUS) 300 MG/10ML injection Inject 2 vials ( 20 MLs) into the vein every 6 months Patient taking differently: Inject 600 mg into the vein See admin instructions. Infuse 600 mg every 8 months 07/31/22   Sater, Pearletha Furl, MD  paliperidone (INVEGA SUSTENNA) 156 MG/ML SUSY injection Inject 1 mL (156 mg total) into the muscle once for 1 dose. 09/12/22 09/12/22  Earney Navy, NP  paliperidone (INVEGA) 9 MG 24 hr tablet Take 1 tablet (9 mg total) by mouth daily for 7 days. 09/10/22 09/17/22  Earney Navy, NP  temazepam (RESTORIL) 7.5 MG capsule Take 1 capsule (7.5 mg total) by mouth at bedtime as needed for sleep. 09/09/22   Earney Navy, NP  topiramate (TOPAMAX) 25 MG tablet TAKE ONE TABLET BY MOUTH TWICE A DAY Patient not taking: Reported on 09/01/2022 01/23/22   Glean Salvo, NP  gabapentin (NEURONTIN) 400 MG capsule Take 400 mg by mouth 3 (three) times daily. 12/26/20 01/14/21  [provider]  propranolol (INDERAL) 10 MG tablet Take 1 tablet (10 mg total) by mouth 2 (two) times daily. 05/03/15 01/03/20  Charm Rings, NP    Family History Family History  Problem Relation Age of Onset   Diabetes Mother    ADD / ADHD Brother     Social History Social History   Tobacco Use   Smoking status: Every Day    Packs/day: .25    Types: Cigarettes    Smokeless tobacco: Never   Tobacco comments:    2 cigarettes a day  Vaping Use   Vaping Use: Never used  Substance Use Topics   Alcohol use: Not Currently    Alcohol/week: 0.0 standard drinks of alcohol    Comment: "A little bit"    Drug use: Not Currently    Types: Marijuana    Comment: Last used: unknown      Allergies   Divalproex sodium   Review of Systems Review of Systems  Respiratory:  Positive for cough.      Physical Exam Triage  Vital Signs ED Triage Vitals  Enc Vitals Group     BP 12/01/22 1216 (!) 158/88     Pulse Rate 12/01/22 1211 (!) 110     Resp 12/01/22 1211 18     Temp 12/01/22 1211 98.1 F (36.7 C)     Temp Source 12/01/22 1211 Oral     SpO2 12/01/22 1211 96 %     Weight --      Height --      Head Circumference --      Peak Flow --      Pain Score --      Pain Loc --      Pain Edu? --      Excl. in GC? --    No data found.  Updated Vital Signs BP (!) 158/88   Pulse (!) 110   Temp 98.1 F (36.7 C) (Oral)   Resp 18   SpO2 96%   Visual Acuity Right Eye Distance:   Left Eye Distance:   Bilateral Distance:    Right Eye Near:   Left Eye Near:    Bilateral Near:     Physical Exam Vitals reviewed.  Constitutional:      General: He is not in acute distress.    Appearance: He is not ill-appearing, toxic-appearing or diaphoretic.  HENT:     Right Ear: Ear canal normal.     Left Ear: Tympanic membrane and ear canal normal.     Ears:     Comments: Right tympanic membrane is red and dull.    Nose: Congestion present.     Mouth/Throat:     Mouth: Mucous membranes are moist.     Pharynx: No oropharyngeal exudate or posterior oropharyngeal erythema.  Eyes:     Extraocular Movements: Extraocular movements intact.     Conjunctiva/sclera: Conjunctivae normal.     Pupils: Pupils are equal, round, and reactive to light.  Cardiovascular:     Rate and Rhythm: Normal rate and regular rhythm.     Heart sounds: No murmur  heard. Pulmonary:     Effort: No respiratory distress.     Breath sounds: No stridor. No wheezing, rhonchi or rales.  Musculoskeletal:     Cervical back: Neck supple.  Lymphadenopathy:     Cervical: No cervical adenopathy.  Skin:    Capillary Refill: Capillary refill takes less than 2 seconds.     Coloration: Skin is not jaundiced or pale.  Neurological:     General: No focal deficit present.     Mental Status: He is alert and oriented to person, place, and time.  Psychiatric:        Behavior: Behavior normal.      UC Treatments / Results  Labs (all labs ordered are listed, but only abnormal results are displayed) Labs Reviewed  SARS CORONAVIRUS 2 (TAT 6-24 HRS)    EKG   Radiology No results found.  Procedures Procedures (including critical care time)  Medications Ordered in UC Medications - No data to display  Initial Impression / Assessment and Plan / UC Course  I have reviewed the triage vital signs and the nursing notes.  Pertinent labs & imaging results that were available during my care of the patient were reviewed by me and considered in my medical decision making (see chart for details).        Tessalon Perles are sent in for the cough.  Amoxicillin is sent in for the ear infection.  COVID swab is done and  if positive, he would be a candidate for molnupiravir.  He cannot take Paxlovid, as it would interact with his Eliquis.  Final Clinical Impressions(s) / UC Diagnoses   Final diagnoses:  Viral upper respiratory tract infection  Right otitis media, unspecified otitis media type     Discharge Instructions      Take amoxicillin 875 mg--1 tab twice daily for 7 days  Take benzonatate 100 mg, 1 tab every 8 hours as needed for cough.   You have been swabbed for COVID, and the test will result in the next 24 hours. Our staff will call you if positive. If the COVID test is positive, you should quarantine until you are fever free for 24 hours and  you are starting to feel better, and then take added precautions for the next 5 days, such as physical distancing/wearing a mask and good hand hygiene/washing.      ED Prescriptions     Medication Sig Dispense Auth. Provider   amoxicillin (AMOXIL) 875 MG tablet Take 1 tablet (875 mg total) by mouth 2 (two) times daily for 7 days. 14 tablet Abrahim Sargent, Janace Aris, MD   benzonatate (TESSALON) 100 MG capsule Take 1 capsule (100 mg total) by mouth 3 (three) times daily as needed for cough. 21 capsule Zenia Resides, MD      PDMP not reviewed this encounter.   Zenia Resides, MD 12/01/22 [phone]

## 2022-12-01 NOTE — ED Triage Notes (Signed)
Pt is here with Grandmother; states pt has cough and congestion x 4 days.  Pt is taking Alka-Selter with no relief.

## 2022-12-02 LAB — SARS CORONAVIRUS 2 (TAT 6-24 HRS): SARS Coronavirus 2: NEGATIVE

## 2022-12-12 ENCOUNTER — Ambulatory Visit: Payer: 59 | Attending: Family Medicine | Admitting: Physical Therapy

## 2022-12-12 ENCOUNTER — Encounter: Payer: Self-pay | Admitting: Physical Therapy

## 2022-12-12 DIAGNOSIS — R2689 Other abnormalities of gait and mobility: Secondary | ICD-10-CM | POA: Insufficient documentation

## 2022-12-12 DIAGNOSIS — M6281 Muscle weakness (generalized): Secondary | ICD-10-CM | POA: Diagnosis present

## 2022-12-12 DIAGNOSIS — R29818 Other symptoms and signs involving the nervous system: Secondary | ICD-10-CM | POA: Diagnosis present

## 2022-12-12 DIAGNOSIS — R2681 Unsteadiness on feet: Secondary | ICD-10-CM | POA: Insufficient documentation

## 2022-12-12 NOTE — Therapy (Signed)
OUTPATIENT PHYSICAL THERAPY NEURO EVALUATION   Patient Name: Brandon [name] MRN: [id number] DOB:May 19, 1991, 32 y.o., male Today's Date: 12/12/2022   PCP: Stevphen Rochester, MD  REFERRING PROVIDER: Stevphen Rochester, MD   END OF SESSION:  PT End of Session - 12/12/22 1312     Visit Number 1    Number of Visits 5    Date for PT Re-Evaluation 01/11/23    Authorization Type [employer]    PT Start Time 1310    PT Stop Time 1351    PT Time Calculation (min) 41 min    Equipment Utilized During Treatment Gait belt    Activity Tolerance Patient tolerated treatment well    Behavior During Therapy WFL for tasks assessed/performed;Impulsive             Past Medical History:  Diagnosis Date   ADHD (attention deficit hyperactivity disorder)    Bipolar 1 disorder (HCC)    Chronic back pain    Chronic constipation    Chronic neck pain    Hypertension    Multiple sclerosis (HCC) 05/20/2013   left sided weakness, dysarthria   Non-compliance    Obesity    Pulmonary embolism (HCC)    Schizophrenia (HCC)    Stroke (HCC)    left sided deficits - pt's mother denies this   Past Surgical History:  Procedure Laterality Date   NO PAST SURGERIES     None     RADIOLOGY WITH ANESTHESIA N/A 01/16/2021   Procedure: MRI WITH ANESTHESIA CERVICAL AND THORASIC SPINE WITH AND WITHOUT CONTRAST;  Surgeon: Radiologist, Medication, MD;  Location: [facility];  Service: Radiology;  Laterality: N/A;   TOOTH EXTRACTION N/A 06/24/2019   Procedure: DENTAL RESTORATION/EXTRACTION OF TEETH NUMBER ONE, SIXTEEN, SEVENTEEN, NINETEEN, THIRTY-TWO;  Surgeon: Ocie Doyne, DDS;  Location: [facility];  Service: Oral Surgery;  Laterality: N/A;   Patient Active Problem List   Diagnosis Date Noted   Involuntary commitment 04/03/2022   Schizophrenia, 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   Aggressive behavior 08/13/2021   Acute alcohol intoxication (HCC) 07/17/2021   Schizophrenia, 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HCC) 03/05/2021    Unspecified intellectual disabilities 01/27/2020   Migraine headache 04/18/2019   Generalized weakness    Multiple sclerosis (HCC) 05/20/2013   HTN (hypertension) 07/04/2011   Ataxia 07/02/2011   Obesity, Class III, BMI 40-49.9 (morbid obesity) (HCC) 01/14/2010   Attention deficit hyperactivity disorder (ADHD) 01/14/2010    ONSET DATE: 11/28/2022  REFERRING DIAG: R26.89 (ICD-10-CM) - Balance disorder S16.1XXA (ICD-10-CM) - Neck strain    THERAPY DIAG:  Unsteadiness on feet  Muscle weakness (generalized)  Other abnormalities of gait and mobility  Other symptoms and signs involving the nervous system  Rationale for Evaluation and Treatment: Rehabilitation  SUBJECTIVE:  SUBJECTIVE STATEMENT: Pt's mom reports that his head is always tilted to the R and causes him to fall. Pt is off balance. Pt normally has to hold onto the wall to keep his balance. Has had 2 falls in the past 6 months. Did not hurt himself. Does not have any device at home, "states he does not need one". Reports walking 500 yards a day. Pt's mom reports they had an order for an AFO a long time ago, but never picked it up. Will likely need a new order. Pt goes to a day program M-F Pt accompanied by: self and mom, [name]   PERTINENT HISTORY: PMH: multiple sclerosis, schizophrenia, obesity, HTN, migraine, ADHD  PAIN:  Are you having pain? Yes, pain in upper back   PRECAUTIONS: Fall  WEIGHT BEARING RESTRICTIONS: No  FALLS: Has patient fallen in last 6 months? Yes. Number of falls 2  LIVING ENVIRONMENT: Lives with:  lives with mom and grandma  Lives in: House/apartment Stairs: Yes: External: 3 steps; on right going up Has following equipment at home: None  PLOF: Independent with household mobility without device, Needs  assistance with ADLs, and Leisure: pt likes cars  Needs help with dressing, meals, chores   PATIENT GOALS: Wants to walk a lot better and lose weight   OBJECTIVE:   DIAGNOSTIC FINDINGS: CT head 06/2022: IMPRESSION: 1. No acute intracranial process. 2. Stable atrophy and chronic small vessel ischemic changes.  COGNITION: Overall cognitive status: Impaired and takes incr time to answer questions.    SENSATION: WFL  COORDINATION: Heel to shin: less coordinated with LLE    POSTURE: rounded shoulders, forward head, posterior pelvic tilt, weight shift right, and head tilted to the R   LOWER EXTREMITY MMT:    MMT Right Eval Left Eval  Hip flexion 4 4  Hip extension    Hip abduction 5 5  Hip adduction 5 5  Hip internal rotation    Hip external rotation    Knee flexion 4+ 4+  Knee extension 5 5  Ankle dorsiflexion 5 5  Ankle plantarflexion    Ankle inversion    Ankle eversion    (Blank rows = not tested)   TRANSFERS: Assistive device utilized: None  Sit to stand: SBA Stand to sit: SBA Pt with decr eccentric control when sitting, does not stand fully erect.   GAIT: Gait pattern: step through pattern, decreased arm swing- Right, decreased arm swing- Left, decreased stride length, decreased hip/knee flexion- Left, decreased ankle dorsiflexion- Left, decreased trunk rotation, trunk flexed, and wide BOS Distance walked: Clinic distances  Assistive device utilized: None Level of assistance: CGA Comments: Pt ambulates very quickly, cued to slow down. Pt with decr foot clearance with LLE and almost catching his foot multiple times and hearing LLE foot scuffing throughout   FUNCTIONAL TESTS:  5 times sit to stand: 13.35 seconds with no UE support, decr eccentric control, does not sit down all the way.   10 meter walk test: 10.4 seconds = 3.15 ft/sec   SLS: LLE: <1 second, RLE: 3.4 seconds    [facility] PT Assessment - 12/12/22 1334       Standardized Balance Assessment    Standardized Balance Assessment Berg Balance Test      Berg Balance Test   Sit to Stand Able to stand without using hands and stabilize independently    Standing Unsupported Able to stand safely 2 minutes    Sitting with Back Unsupported but Feet Supported on Floor or Stool Able  to sit safely and securely 2 minutes    Stand to Sit Controls descent by using hands    Transfers Able to transfer safely, minor use of hands    Standing Unsupported with Eyes Closed Able to stand 10 seconds safely    Standing Unsupported with Feet Together Needs help to attain position and unable to hold for 15 seconds    From Standing, Reach Forward with Outstretched Arm Can reach forward >12 cm safely (5")    From Standing Position, Pick up Object from Floor Able to pick up shoe, needs supervision    From Standing Position, Turn to Look Behind Over each Shoulder Looks behind one side only/other side shows less weight shift    Turn 360 Degrees Needs close supervision or verbal cueing    Standing Unsupported, Alternately Place Feet on Step/Stool Able to complete >2 steps/needs minimal assist    Standing Unsupported, One Foot in [employer] balance while stepping or standing    Standing on One Leg Tries to lift leg/unable to hold 3 seconds but remains standing independently    Total Score 35    Berg comment: 35/56 = High Fall Risk               TODAY'S TREATMENT:                                                                                                                              N/A during eval    PATIENT EDUCATION: Education details: Clinical findings, POC, trialing LLE AFOs for LLE foot clearance and decr fall risk/improving gait. Pt's mom reports he got an order for one a long time ago, but never got one.  Person educated: Patient and Spouse Education method: Explanation Education comprehension: verbalized understanding  HOME EXERCISE PROGRAM: Will provide at future session.   GOALS: Goals  reviewed with patient? Yes  SHORT TERM GOALS: ALL STGS = LTGS  LONG TERM GOALS: Target date: 01/09/2023  Pt will be independent with final HEP with family supervision in order to build upon functional gains made in therapy. Baseline:  Goal status: INITIAL  2.  Pt will undergo trials of different LLE AFOs in order to determine best AFO to improve gait mechanics, decr fall risk, and LLE foot clearance.  Baseline:  Goal status: INITIAL  3.  Pt will improve BERG to at least a 40/56 in order to demo decr fall risk.  Baseline: 35/56 Goal status: INITIAL    ASSESSMENT:  CLINICAL IMPRESSION: Patient is a 32 year old male referred to Neuro OPPT for balance disorder, MS.   Pt's PMH is significant for: multiple sclerosis, schizophrenia, obesity, HTN, migraine, ADHD. The following deficits were present during the exam: impaired balance, postural abnormalities, gait abnormalities with impulsivity, decr strength, impaired coordination. Pt with decr foot clearance with LLE and would benefit from a L AFO. Based on BERG, pt is an incr risk for falls. Pt would benefit from skilled  PT to address these impairments and functional limitations to maximize functional mobility independence and decr fall risk.    OBJECTIVE IMPAIRMENTS: Abnormal gait, decreased activity tolerance, decreased balance, decreased cognition, decreased mobility, difficulty walking, decreased strength, impaired flexibility, postural dysfunction, and pain.   ACTIVITY LIMITATIONS: standing, transfers, dressing, and locomotion level  PARTICIPATION LIMITATIONS: meal prep, cleaning, laundry, driving, and community activity  PERSONAL FACTORS: Age, Behavior pattern, Past/current experiences, Time since onset of injury/illness/exacerbation, and 3+ comorbidities: multiple sclerosis, schizophrenia, obesity, HTN, migraine, ADHD  are also affecting patient's functional outcome.   REHAB POTENTIAL: Fair due to co-morbidities   CLINICAL DECISION  MAKING: Evolving/moderate complexity  EVALUATION COMPLEXITY: Moderate  PLAN:  PT FREQUENCY: 1x/week  PT DURATION: 4 weeks  PLANNED INTERVENTIONS: Therapeutic exercises, Therapeutic activity, Neuromuscular re-education, Balance training, Gait training, Patient/Family education, Self Care, Joint mobilization, Stair training, Vestibular training, Orthotic/Fit training, DME instructions, and Re-evaluation  PLAN FOR NEXT SESSION: Trial AFOs for LLE and determine which one, will have to send order for one, give HEP for functional strengthening, balance, neck stretches/posture exercises.    Drake Leach, PT, DPT  12/12/2022, 4:05 PM

## 2022-12-17 ENCOUNTER — Encounter: Payer: Self-pay | Admitting: Physical Therapy

## 2022-12-17 ENCOUNTER — Ambulatory Visit: Payer: 59 | Admitting: Physical Therapy

## 2022-12-17 DIAGNOSIS — M6281 Muscle weakness (generalized): Secondary | ICD-10-CM

## 2022-12-17 DIAGNOSIS — R29818 Other symptoms and signs involving the nervous system: Secondary | ICD-10-CM

## 2022-12-17 DIAGNOSIS — R2681 Unsteadiness on feet: Secondary | ICD-10-CM

## 2022-12-17 DIAGNOSIS — R2689 Other abnormalities of gait and mobility: Secondary | ICD-10-CM

## 2022-12-17 NOTE — Patient Instructions (Signed)
Access Code: [email or web address] URL: [email or web address] Date: 12/17/2022 Prepared by: Camille Bal  Exercises - Seated Cervical Sidebending Stretch  - 1 x daily - 7 x weekly - 1 sets - 2 reps - 45 seconds hold - Seated Levator Scapulae Stretch  - 1 x daily - 7 x weekly - 1 sets - 2 reps - 45 seconds hold - Seated Cervical Rotation AROM  - 1 x daily - 7 x weekly - 2 sets - 10 reps - Seated Scapular Retraction  - 1 x daily - 7 x weekly - 2 sets - 10 reps - Sit to Stand with Arms Crossed  - 1 x daily - 7 x weekly - 2 sets - 10 reps

## 2022-12-17 NOTE — Therapy (Signed)
OUTPATIENT PHYSICAL THERAPY NEURO TREATMENT   Patient Name: Brandon [name] MRN: [id number] DOB:Sep 14, 1990, 32 y.o., male Today's Date: 12/17/2022   PCP: Stevphen Rochester, MD  REFERRING PROVIDER: Stevphen Rochester, MD   END OF SESSION:  PT End of Session - 12/17/22 1620     Visit Number 2    Number of Visits 5    Date for PT Re-Evaluation 01/11/23    Authorization Type [employer] Medicare    PT Start Time 1620    PT Stop Time 1659    PT Time Calculation (min) 39 min    Equipment Utilized During Treatment Gait belt    Activity Tolerance Patient tolerated treatment well    Behavior During Therapy WFL for tasks assessed/performed;Impulsive             Past Medical History:  Diagnosis Date   ADHD (attention deficit hyperactivity disorder)    Bipolar 1 disorder (HCC)    Chronic back pain    Chronic constipation    Chronic neck pain    Hypertension    Multiple sclerosis (HCC) 05/20/2013   left sided weakness, dysarthria   Non-compliance    Obesity    Pulmonary embolism (HCC)    Schizophrenia (HCC)    Stroke (HCC)    left sided deficits - pt's mother denies this   Past Surgical History:  Procedure Laterality Date   NO PAST SURGERIES     None     RADIOLOGY WITH ANESTHESIA N/A 01/16/2021   Procedure: MRI WITH ANESTHESIA CERVICAL AND THORASIC SPINE WITH AND WITHOUT CONTRAST;  Surgeon: Radiologist, Medication, MD;  Location: [facility] OR;  Service: Radiology;  Laterality: N/A;   TOOTH EXTRACTION N/A 06/24/2019   Procedure: DENTAL RESTORATION/EXTRACTION OF TEETH NUMBER ONE, SIXTEEN, SEVENTEEN, NINETEEN, THIRTY-TWO;  Surgeon: Ocie Doyne, DDS;  Location: [facility] OR;  Service: Oral Surgery;  Laterality: N/A;   Patient Active Problem List   Diagnosis Date Noted   Involuntary commitment 04/03/2022   Schizophrenia, 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   Aggressive behavior 08/13/2021   Acute alcohol intoxication (HCC) 07/17/2021   Schizophrenia, 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HCC) 03/05/2021    Unspecified intellectual disabilities 01/27/2020   Migraine headache 04/18/2019   Generalized weakness    Multiple sclerosis (HCC) 05/20/2013   HTN (hypertension) 07/04/2011   Ataxia 07/02/2011   Obesity, Class III, BMI 40-49.9 (morbid obesity) (HCC) 01/14/2010   Attention deficit hyperactivity disorder (ADHD) 01/14/2010    ONSET DATE: 11/28/2022  REFERRING DIAG: R26.89 (ICD-10-CM) - Balance disorder S16.1XXA (ICD-10-CM) - Neck strain    THERAPY DIAG:  Unsteadiness on feet  Muscle weakness (generalized)  Other abnormalities of gait and mobility  Other symptoms and signs involving the nervous system  Rationale for Evaluation and Treatment: Rehabilitation  SUBJECTIVE:  SUBJECTIVE STATEMENT: Pt's grandmom reports patient has had no falls.  He denies pain or other issues today. Pt accompanied by: self and grandmom-Louise   PERTINENT HISTORY: PMH: multiple sclerosis, schizophrenia, obesity, HTN, migraine, ADHD  PAIN:  Are you having pain? No  PRECAUTIONS: Fall  WEIGHT BEARING RESTRICTIONS: No  FALLS: Has patient fallen in last 6 months? Yes. Number of falls 2  LIVING ENVIRONMENT: Lives with:  lives with mom and grandma  Lives in: House/apartment Stairs: Yes: External: 3 steps; on right going up Has following equipment at home: None  PLOF: Independent with household mobility without device, Needs assistance with ADLs, and Leisure: pt likes cars  Needs help with dressing, meals, chores   PATIENT GOALS: Wants to walk a lot better and lose weight   OBJECTIVE:   DIAGNOSTIC FINDINGS: CT head 06/2022: IMPRESSION: 1. No acute intracranial process. 2. Stable atrophy and chronic small vessel ischemic changes.  COGNITION: Overall cognitive status: Impaired and takes incr time to answer  questions.    SENSATION: WFL  COORDINATION: Heel to shin: less coordinated with LLE    POSTURE: rounded shoulders, forward head, posterior pelvic tilt, weight shift right, and head tilted to the R   LOWER EXTREMITY MMT:    MMT Right Eval Left Eval  Hip flexion 4 4  Hip extension    Hip abduction 5 5  Hip adduction 5 5  Hip internal rotation    Hip external rotation    Knee flexion 4+ 4+  Knee extension 5 5  Ankle dorsiflexion 5 5  Ankle plantarflexion    Ankle inversion    Ankle eversion    (Blank rows = not tested)   TRANSFERS: Assistive device utilized: None  Sit to stand: SBA Stand to sit: SBA Pt with decr eccentric control when sitting, does not stand fully erect.   GAIT: Gait pattern: step through pattern, decreased arm swing- Right, decreased arm swing- Left, decreased stride length, decreased hip/knee flexion- Left, decreased ankle dorsiflexion- Left, decreased trunk rotation, trunk flexed, and wide BOS Distance walked: Clinic distances  Assistive device utilized: None Level of assistance: CGA Comments: Pt ambulates very quickly, cued to slow down. Pt with decr foot clearance with LLE and almost catching his foot multiple times and hearing LLE foot scuffing throughout   FUNCTIONAL TESTS:  5 times sit to stand: 13.35 seconds with no UE support, decr eccentric control, does not sit down all the way.   10 meter walk test: 10.4 seconds = 3.15 ft/sec   SLS: LLE: <1 second, RLE: 3.4 seconds        TODAY'S TREATMENT:                                                                                                                              -Introduced concept of AFO w/ Thuasne and Ott[place]ock posterior options in clinic.  Allowed pt to hold to compare weight w/ PT providing education on purpose and function.  Pt requested to have PT don.  Donned AFO, but did not ambulate as pt has slides today.  Encouraged wearing tennis shoes to future visits as this is  necessary for AFO trialing.  He and grandmother verbalize understanding.  He did request to stand w/ AFO donned in slide just to assess feel and function.  He states he believes he feels more balanced and that this is comfortable.  He is agreeable to further practice. -SciFit x6 minutes using BLE/BUE on level 2.0 for dynamic warmup and strengthening.  Pt reports he likes this activity.  Moderate cuing to slow pace and conserve energy for remainder of session. -Lateral cervical flexion stretch 2x45 seconds > upper trap stretch 2x45 seconds (focused on stretching right side as pt holds head in right lateral tilt today) -Seated cervical rotation 2x10, cued to hold head upright and in neutral, pt self-initiates correction on second set -Scapular squeezes 2x12, used mirrored feedback and return demo and tactile cues to prevent shrugging vs scap retraction and markers on mirror for midline correction w/ pt unable to maintain correction or sustain attention to task -STS x4, x8 progressed to no UE support using mirror feedback for posture  Frequent redirection to task.  PATIENT EDUCATION: Education details: [employer] for AFO trials in future sessions.   Person educated: Patient and Spouse Education method: Explanation Education comprehension: verbalized understanding  HOME EXERCISE PROGRAM: Access Code: [email or web address] URL: [email or web address] Date: 12/17/2022 Prepared by: Camille Bal  Exercises - Seated Cervical Sidebending Stretch  - 1 x daily - 7 x weekly - 1 sets - 2 reps - 45 seconds hold - Seated Levator Scapulae Stretch  - 1 x daily - 7 x weekly - 1 sets - 2 reps - 45 seconds hold - Seated Cervical Rotation AROM  - 1 x daily - 7 x weekly - 2 sets - 10 reps - Seated Scapular Retraction  - 1 x daily - 7 x weekly - 2 sets - 10 reps - Sit to Stand with Arms Crossed  - 1 x daily - 7 x weekly - 2 sets - 10 reps  GOALS: Goals reviewed with patient? Yes  SHORT TERM GOALS:  ALL STGS = LTGS  LONG TERM GOALS: Target date: 01/09/2023  Pt will be independent with final HEP with family supervision in order to build upon functional gains made in therapy. Baseline:  Goal status: INITIAL  2.  Pt will undergo trials of different LLE AFOs in order to determine best AFO to improve gait mechanics, decr fall risk, and LLE foot clearance.  Baseline:  Goal status: INITIAL  3.  Pt will improve BERG to at least a 40/56 in order to demo decr fall risk.  Baseline: 35/56 Goal status: INITIAL    ASSESSMENT:  CLINICAL IMPRESSION: Introduced an HEP today focused on cervical stretching, postural and functional strength.  Pt has difficulty with sustained attention to task requiring frequent redirection.  PT unable to formally trial AFO options due to footwear of patient worn this visit, but did introduce concept.  He is open to trying these at a future visit.  Will continue per skilled PT POC as pt would benefit from further gait training and AFO assessment as able.  OBJECTIVE IMPAIRMENTS: Abnormal gait, decreased activity tolerance, decreased balance, decreased cognition, decreased mobility, difficulty walking, decreased strength, impaired flexibility, postural dysfunction, and pain.   ACTIVITY LIMITATIONS: standing, transfers, dressing, and locomotion level  PARTICIPATION LIMITATIONS: meal prep, cleaning, laundry, driving, and community activity  PERSONAL  FACTORS: Age, Behavior pattern, Past/current experiences, Time since onset of injury/illness/exacerbation, and 3+ comorbidities: multiple sclerosis, schizophrenia, obesity, HTN, migraine, ADHD  are also affecting patient's functional outcome.   REHAB POTENTIAL: Fair due to co-morbidities   CLINICAL DECISION MAKING: Evolving/moderate complexity  EVALUATION COMPLEXITY: Moderate  PLAN:  PT FREQUENCY: 1x/week  PT DURATION: 4 weeks  PLANNED INTERVENTIONS: Therapeutic exercises, Therapeutic activity, Neuromuscular  re-education, Balance training, Gait training, Patient/Family education, Self Care, Joint mobilization, Stair training, Vestibular training, Orthotic/Fit training, DME instructions, and Re-evaluation  PLAN FOR NEXT SESSION: Trial AFOs for LLE and determine which one, will have to send order for one, update HEP prn for functional strengthening, balance, neck stretches/posture exercises.  Pt enjoys SciFit.   Sadie Haber, PT, DPT  12/17/2022, 4:59 PM

## 2022-12-24 ENCOUNTER — Encounter: Payer: Self-pay | Admitting: Physical Therapy

## 2022-12-24 ENCOUNTER — Ambulatory Visit: Payer: 59 | Admitting: Physical Therapy

## 2022-12-24 DIAGNOSIS — M6281 Muscle weakness (generalized): Secondary | ICD-10-CM

## 2022-12-24 DIAGNOSIS — R29818 Other symptoms and signs involving the nervous system: Secondary | ICD-10-CM

## 2022-12-24 DIAGNOSIS — R2681 Unsteadiness on feet: Secondary | ICD-10-CM | POA: Diagnosis not present

## 2022-12-24 DIAGNOSIS — R2689 Other abnormalities of gait and mobility: Secondary | ICD-10-CM

## 2022-12-24 NOTE — Therapy (Signed)
OUTPATIENT PHYSICAL THERAPY NEURO TREATMENT   Patient Name: Brandon [name] MRN: [id number] DOB:Jan 09, 1991, 32 y.o., male Today's Date: 12/24/2022   PCP: Stevphen Rochester, MD  REFERRING PROVIDER: Stevphen Rochester, MD   END OF SESSION:  PT End of Session - 12/24/22 1628     Visit Number 3    Number of Visits 5    Date for PT Re-Evaluation 01/11/23    Authorization Type [employer] Medicare    PT Start Time 1628   pt in restroom at onset of session   PT Stop Time 1655    PT Time Calculation (min) 27 min    Equipment Utilized During Treatment Gait belt    Activity Tolerance Patient tolerated treatment well    Behavior During Therapy [facility] for tasks assessed/performed;Impulsive             Past Medical History:  Diagnosis Date   ADHD (attention deficit hyperactivity disorder)    Bipolar 1 disorder (HCC)    Chronic back pain    Chronic constipation    Chronic neck pain    Hypertension    Multiple sclerosis (HCC) 05/20/2013   left sided weakness, dysarthria   Non-compliance    Obesity    Pulmonary embolism (HCC)    Schizophrenia (HCC)    Stroke (HCC)    left sided deficits - pt's mother denies this   Past Surgical History:  Procedure Laterality Date   NO PAST SURGERIES     None     RADIOLOGY WITH ANESTHESIA N/A 01/16/2021   Procedure: MRI WITH ANESTHESIA CERVICAL AND THORASIC SPINE WITH AND WITHOUT CONTRAST;  Surgeon: Radiologist, Medication, MD;  Location: [facility] OR;  Service: Radiology;  Laterality: N/A;   TOOTH EXTRACTION N/A 06/24/2019   Procedure: DENTAL RESTORATION/EXTRACTION OF TEETH NUMBER ONE, SIXTEEN, SEVENTEEN, NINETEEN, THIRTY-TWO;  Surgeon: Ocie Doyne, DDS;  Location: [facility] OR;  Service: Oral Surgery;  Laterality: N/A;   Patient Active Problem List   Diagnosis Date Noted   Involuntary commitment 04/03/2022   Schizophrenia, 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   Aggressive behavior 08/13/2021   Acute alcohol intoxication (HCC) 07/17/2021   Schizophrenia,  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HCC) 03/05/2021   Unspecified intellectual disabilities 01/27/2020   Migraine headache 04/18/2019   Generalized weakness    Multiple sclerosis (HCC) 05/20/2013   HTN (hypertension) 07/04/2011   Ataxia 07/02/2011   Obesity, Class III, BMI 40-49.9 (morbid obesity) (HCC) 01/14/2010   Attention deficit hyperactivity disorder (ADHD) 01/14/2010    ONSET DATE: 11/28/2022  REFERRING DIAG: R26.89 (ICD-10-CM) - Balance disorder S16.1XXA (ICD-10-CM) - Neck strain    THERAPY DIAG:  Unsteadiness on feet  Muscle weakness (generalized)  Other abnormalities of gait and mobility  Other symptoms and signs involving the nervous system  Rationale for Evaluation and Treatment: Rehabilitation  SUBJECTIVE:  SUBJECTIVE STATEMENT: Pt denies falls.  He denies pain or other issues today. Pt accompanied by: self and mom-in lobby  PERTINENT HISTORY: PMH: multiple sclerosis, schizophrenia, obesity, HTN, migraine, ADHD  PAIN:  Are you having pain? No  PRECAUTIONS: Fall  WEIGHT BEARING RESTRICTIONS: No  FALLS: Has patient fallen in last 6 months? Yes. Number of falls 2  LIVING ENVIRONMENT: Lives with:  lives with mom and grandma  Lives in: House/apartment Stairs: Yes: External: 3 steps; on right going up Has following equipment at home: None  PLOF: Independent with household mobility without device, Needs assistance with ADLs, and Leisure: pt likes cars  Needs help with dressing, meals, chores   PATIENT GOALS: Wants to walk a lot better and lose weight   OBJECTIVE:   DIAGNOSTIC FINDINGS: CT head 06/2022: IMPRESSION: 1. No acute intracranial process. 2. Stable atrophy and chronic small vessel ischemic changes.  COGNITION: Overall cognitive status: Impaired and takes incr time to answer  questions.    SENSATION: WFL  COORDINATION: Heel to shin: less coordinated with LLE    POSTURE: rounded shoulders, forward head, posterior pelvic tilt, weight shift right, and head tilted to the R   LOWER EXTREMITY MMT:    MMT Right Eval Left Eval  Hip flexion 4 4  Hip extension    Hip abduction 5 5  Hip adduction 5 5  Hip internal rotation    Hip external rotation    Knee flexion 4+ 4+  Knee extension 5 5  Ankle dorsiflexion 5 5  Ankle plantarflexion    Ankle inversion    Ankle eversion    (Blank rows = not tested)   TRANSFERS: Assistive device utilized: None  Sit to stand: SBA Stand to sit: SBA Pt with decr eccentric control when sitting, does not stand fully erect.   GAIT: Gait pattern: step through pattern, decreased arm swing- Right, decreased arm swing- Left, decreased stride length, decreased hip/knee flexion- Left, decreased ankle dorsiflexion- Left, decreased trunk rotation, trunk flexed, and wide BOS Distance walked: Clinic distances  Assistive device utilized: None Level of assistance: CGA Comments: Pt ambulates very quickly, cued to slow down. Pt with decr foot clearance with LLE and almost catching his foot multiple times and hearing LLE foot scuffing throughout   FUNCTIONAL TESTS:  5 times sit to stand: 13.35 seconds with no UE support, decr eccentric control, does not sit down all the way.   10 meter walk test: 10.4 seconds = 3.15 ft/sec   SLS: LLE: <1 second, RLE: 3.4 seconds        TODAY'S TREATMENT:                                                                                                                              -Time spent redirecting pt to mat table to sit in preparation for donning AFO as pt wandering empty gym.  He is agreeable to redirection after x3 attempts.  In sitting when instructed  to doff left shoe he removes right shoe and requires one step commands to re-donn right shoe and then doff left shoe.  MinA provided to don  AFOs, pt independent in removal w/ verbal cues for sequencing-increased time allotted to promote pt independence with both components of task.  GAIT: Gait pattern: wide BOS and poor foot clearance- Left Distance walked: 2' + 230' +230' Assistive device utilized:  Posterior Ott[place]ock AFO followed by Posterior Thuasne AFO followed by simulated toe cap Level of assistance: SBA and CGA Comments: Pt ambulates quickly and has better improvement of foot clearance w/ frequent and consistent cues to increase hip and knee flexion on left vs allowing modI gait w/ AFO as scuffing still audible and frequent w/o resultant LOB (this applies to both bracing options).  He appears to be vaulting on the RLE w/ continued scuffing of LLE when wearing Thuasne brace, but reports this brace feels lighter.  Used teach-back to improve LLE clearance w/ hip and knee flexion with some maintained improvement of roughly 10-15 seconds at a time prior to next needed cue.  Pt reports fatigue following both bouts requiring seated rest for respiratory recovery.  Last bout of gait performed w/o AFO w/ left simulated toe cap.  Pt continues to have right sided vaulting w/ toe cap, but diminished foot scuff without resultant slowing of gait or LOB.  Education provided on purpose of toe cap and potential benefit of practicing more with this before ordering in future session.  Pt open to this.  PATIENT EDUCATION: Education details: [employer]chant [employer] officer for further gait training and trialing of toe cap at next session and for safety in all sessions.  Person educated: Patient and Parent-spoke to mom in lobby regarding bracing options and deficits seen and toe cap option w/ intent to obtain referral if testing in outdoor conditions looks good next visit. Education method: Explanation Education comprehension: verbalized understanding  HOME EXERCISE PROGRAM: Access Code: [email or web address] URL: [email or web address] Date: 12/17/2022 Prepared  by: Camille Bal  Exercises - Seated Cervical Sidebending Stretch  - 1 x daily - 7 x weekly - 1 sets - 2 reps - 45 seconds hold - Seated Levator Scapulae Stretch  - 1 x daily - 7 x weekly - 1 sets - 2 reps - 45 seconds hold - Seated Cervical Rotation AROM  - 1 x daily - 7 x weekly - 2 sets - 10 reps - Seated Scapular Retraction  - 1 x daily - 7 x weekly - 2 sets - 10 reps - Sit to Stand with Arms Crossed  - 1 x daily - 7 x weekly - 2 sets - 10 reps  GOALS: Goals reviewed with patient? Yes  SHORT TERM GOALS: ALL STGS = LTGS  LONG TERM GOALS: Target date: 01/09/2023  Pt will be independent with final HEP with family supervision in order to build upon functional gains made in therapy. Baseline:  Goal status: INITIAL  2.  Pt will undergo trials of different LLE AFOs in order to determine best AFO to improve gait mechanics, decr fall risk, and LLE foot clearance.  Baseline:  Goal status: INITIAL  3.  Pt will improve BERG to at least a 40/56 in order to demo decr fall risk.  Baseline: 35/56 Goal status: INITIAL    ASSESSMENT:  CLINICAL IMPRESSION: Trialed AFO options this visit with none correcting deficits noted prior.  He would benefit from further trialing toe cap option as this was only option that minimized disruption to gait with  poor foot clearance.  Pt continues to have vaulting over RLE w/ all options tried today which PT is unlikely to successfully correct.  Will continue to assess options for safest gait and mobility at next session and place order request as appropriate.  OBJECTIVE IMPAIRMENTS: Abnormal gait, decreased activity tolerance, decreased balance, decreased cognition, decreased mobility, difficulty walking, decreased strength, impaired flexibility, postural dysfunction, and pain.   ACTIVITY LIMITATIONS: standing, transfers, dressing, and locomotion level  PARTICIPATION LIMITATIONS: meal prep, cleaning, laundry, driving, and community activity  PERSONAL  FACTORS: Age, Behavior pattern, Past/current experiences, Time since onset of injury/illness/exacerbation, and 3+ comorbidities: multiple sclerosis, schizophrenia, obesity, HTN, migraine, ADHD  are also affecting patient's functional outcome.   REHAB POTENTIAL: Fair due to co-morbidities   CLINICAL DECISION MAKING: Evolving/moderate complexity  EVALUATION COMPLEXITY: Moderate  PLAN:  PT FREQUENCY: 1x/week  PT DURATION: 4 weeks  PLANNED INTERVENTIONS: Therapeutic exercises, Therapeutic activity, Neuromuscular re-education, Balance training, Gait training, Patient/Family education, Self Care, Joint mobilization, Stair training, Vestibular training, Orthotic/Fit training, DME instructions, and Re-evaluation  PLAN FOR NEXT SESSION: Trial toe cap again-try outdoors over unlevel sidewalk, maybe grass? - will have to send order for one, update HEP prn for functional strengthening, balance, neck stretches/posture exercises.  Pt enjoys SciFit.   Sadie Haber, PT, DPT  12/24/2022, 4:56 PM

## 2022-12-31 ENCOUNTER — Ambulatory Visit: Payer: 59 | Admitting: Physical Therapy

## 2022-12-31 ENCOUNTER — Other Ambulatory Visit: Payer: Self-pay | Admitting: Neurology

## 2022-12-31 ENCOUNTER — Encounter: Payer: Self-pay | Admitting: Physical Therapy

## 2022-12-31 ENCOUNTER — Telehealth: Payer: Self-pay | Admitting: Physical Therapy

## 2022-12-31 DIAGNOSIS — G35 Multiple sclerosis: Secondary | ICD-10-CM

## 2022-12-31 DIAGNOSIS — R2689 Other abnormalities of gait and mobility: Secondary | ICD-10-CM

## 2022-12-31 DIAGNOSIS — M21372 Foot drop, left foot: Secondary | ICD-10-CM

## 2022-12-31 DIAGNOSIS — R2681 Unsteadiness on feet: Secondary | ICD-10-CM

## 2022-12-31 DIAGNOSIS — M6281 Muscle weakness (generalized): Secondary | ICD-10-CM

## 2022-12-31 DIAGNOSIS — R29818 Other symptoms and signs involving the nervous system: Secondary | ICD-10-CM

## 2022-12-31 NOTE — Therapy (Signed)
OUTPATIENT PHYSICAL THERAPY NEURO TREATMENT   Patient Name: Brandon [name] MRN: [id number] DOB:1991-04-16, 32 y.o., male Today's Date: 01/01/2023   PCP: Stevphen Rochester, MD  REFERRING PROVIDER: Stevphen Rochester, MD   END OF SESSION:  PT End of Session - 12/31/22 1619     Visit Number 4    Number of Visits 5    Date for PT Re-Evaluation 01/11/23    Authorization Type [employer]    PT Start Time 1618    PT Stop Time 1658    PT Time Calculation (min) 40 min    Equipment Utilized During Treatment Gait belt    Activity Tolerance Patient tolerated treatment well    Behavior During Therapy WFL for tasks assessed/performed;Impulsive             Past Medical History:  Diagnosis Date   ADHD (attention deficit hyperactivity disorder)    Bipolar 1 disorder (HCC)    Chronic back pain    Chronic constipation    Chronic neck pain    Hypertension    Multiple sclerosis (HCC) 05/20/2013   left sided weakness, dysarthria   Non-compliance    Obesity    Pulmonary embolism (HCC)    Schizophrenia (HCC)    Stroke (HCC)    left sided deficits - pt's mother denies this   Past Surgical History:  Procedure Laterality Date   NO PAST SURGERIES     None     RADIOLOGY WITH ANESTHESIA N/A 01/16/2021   Procedure: MRI WITH ANESTHESIA CERVICAL AND THORASIC SPINE WITH AND WITHOUT CONTRAST;  Surgeon: Radiologist, Medication, MD;  Location: [facility] OR;  Service: Radiology;  Laterality: N/A;   TOOTH EXTRACTION N/A 06/24/2019   Procedure: DENTAL RESTORATION/EXTRACTION OF TEETH NUMBER ONE, SIXTEEN, SEVENTEEN, NINETEEN, THIRTY-TWO;  Surgeon: Ocie Doyne, DDS;  Location: [facility] OR;  Service: Oral Surgery;  Laterality: N/A;   Patient Active Problem List   Diagnosis Date Noted   Involuntary commitment 04/03/2022   Schizophrenia, 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   Aggressive behavior 08/13/2021   Acute alcohol intoxication (HCC) 07/17/2021   Schizophrenia, 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HCC) 03/05/2021    Unspecified intellectual disabilities 01/27/2020   Migraine headache 04/18/2019   Generalized weakness    Multiple sclerosis (HCC) 05/20/2013   HTN (hypertension) 07/04/2011   Ataxia 07/02/2011   Obesity, Class III, BMI 40-49.9 (morbid obesity) (HCC) 01/14/2010   Attention deficit hyperactivity disorder (ADHD) 01/14/2010    ONSET DATE: 11/28/2022  REFERRING DIAG: R26.89 (ICD-10-CM) - Balance disorder S16.1XXA (ICD-10-CM) - Neck strain    THERAPY DIAG:  Unsteadiness on feet  Muscle weakness (generalized)  Other abnormalities of gait and mobility  Other symptoms and signs involving the nervous system  Rationale for Evaluation and Treatment: Rehabilitation  SUBJECTIVE:  SUBJECTIVE STATEMENT: No changes, have been working on his neck exercises Pt accompanied by: self and grandmother  PERTINENT HISTORY: PMH: multiple sclerosis, schizophrenia, obesity, HTN, migraine, ADHD  PAIN:  Are you having pain? No  PRECAUTIONS: Fall  WEIGHT BEARING RESTRICTIONS: No  FALLS: Has patient fallen in last 6 months? Yes. Number of falls 2  LIVING ENVIRONMENT: Lives with:  lives with mom and grandma  Lives in: House/apartment Stairs: Yes: External: 3 steps; on right going up Has following equipment at home: None  PLOF: Independent with household mobility without device, Needs assistance with ADLs, and Leisure: pt likes cars  Needs help with dressing, meals, chores   PATIENT GOALS: Wants to walk a lot better and lose weight   OBJECTIVE:   DIAGNOSTIC FINDINGS: CT head 06/2022: IMPRESSION: 1. No acute intracranial process. 2. Stable atrophy and chronic small vessel ischemic changes.  COGNITION: Overall cognitive status: Impaired and takes incr time to answer questions.     SENSATION: WFL  COORDINATION: Heel to shin: less coordinated with LLE    POSTURE: rounded shoulders, forward head, posterior pelvic tilt, weight shift right, and head tilted to the R   LOWER EXTREMITY MMT:    MMT Right Eval Left Eval  Hip flexion 4 4  Hip extension    Hip abduction 5 5  Hip adduction 5 5  Hip internal rotation    Hip external rotation    Knee flexion 4+ 4+  Knee extension 5 5  Ankle dorsiflexion 5 5  Ankle plantarflexion    Ankle inversion    Ankle eversion    (Blank rows = not tested)   TRANSFERS: Assistive device utilized: None  Sit to stand: SBA Stand to sit: SBA Pt with decr eccentric control when sitting, does not stand fully erect.   GAIT: Gait pattern: step through pattern, decreased arm swing- Right, decreased arm swing- Left, decreased stride length, decreased hip/knee flexion- Left, decreased ankle dorsiflexion- Left, decreased trunk rotation, trunk flexed, and wide BOS Distance walked: Clinic distances  Assistive device utilized: None Level of assistance: CGA Comments: Pt ambulates very quickly, cued to slow down. Pt with decr foot clearance with LLE and almost catching his foot multiple times and hearing LLE foot scuffing throughout   FUNCTIONAL TESTS:  5 times sit to stand: 13.35 seconds with no UE support, decr eccentric control, does not sit down all the way.   10 meter walk test: 10.4 seconds = 3.15 ft/sec   SLS: LLE: <1 second, RLE: 3.4 seconds        TODAY'S TREATMENT:                                                                                                                               GAIT: Gait pattern: wide BOS and poor foot clearance- Left Distance walked: See below for exact distances  Assistive device utilized:  Posterior [place] AFO followed by Posterior [place] AFO followed by simulated toe cap  Level of assistance: SBA and CGA Comments: Pt wanting to try AFOs again this session as he reports that  they help him walk better.   230' x 1 with posterior [place] PLS AFO, pt still with scuffing, but appears better than with no AFO   345' with posterior [place] PLS AFO and blue shoe cover to mimic toe cap, pt did better with foot scuffing with use of toe cap. Pt also reporting this made him feel more steady/confident when going around turns in the gym, when he mainly feels unsteady with this   11' with [place] PLS AFO and blue shoe cover to mimic toe cap, no noteable difference with this brace compared to [place] PLS AFO, still with some scuffing, but pt does better when focusing on picking up LLE with incr hip/knee flexion.  Seated rest breaks provided between each bout of gait due to fatigue.   Pt ambulates quickly throughout all bouts of gait and has better improvement of foot clearance w/ frequent and consistent cues to increase hip and knee flexion on left as well as SLOW DOWN (pt's grandma also reports they cue him to slow down at home). Pt reports that he feels like he can walk better with use of AFO, discussed that best option appears to be AFO and leather toe cap as well as pt focusing on slowing down gait and focusing on foot clearance. Pt in agreement to go through with order with AFO. Needing to ask frequent questions to pt about AFO as pt with tendency to stare off and not engaged/paying attention at times.    Therapeutic Activity: Educated about process of obtaining an AFO with needing an order and written documentation in a physician's note (pt may need to make a new appt with Dr. Epimenio Foot as his last appt was in Jan) - also spoke to pt's mom on the phone regarding this as pt's grandma was present in session. Also educated about use of leather toe cap and showed example and how this is something [employer] can put on his favorite shoe for improved foot clearance during gait. PT to request order from neurologist for L AFO  PT helping pt don/doff AFOs, but also had pt practice getting brace on  and off for pt to show that he can do independently at home, pt able to get it in shoe (with incr time), does need reminder cue each time to unstrap AFO from leg before taking foot out of shoe.    PATIENT EDUCATION: Education details: See therapeutic activity section above  Person educated: Patient and Parent-spoke to pt's mom on the phone regarding AFO, pt's grandma in session  Education method: Explanation Education comprehension: verbalized understanding  HOME EXERCISE PROGRAM: Access Code: [email or web address] URL: [email or web address] Date: 12/17/2022 Prepared by: Camille Bal  Exercises - Seated Cervical Sidebending Stretch  - 1 x daily - 7 x weekly - 1 sets - 2 reps - 45 seconds hold - Seated Levator Scapulae Stretch  - 1 x daily - 7 x weekly - 1 sets - 2 reps - 45 seconds hold - Seated Cervical Rotation AROM  - 1 x daily - 7 x weekly - 2 sets - 10 reps - Seated Scapular Retraction  - 1 x daily - 7 x weekly - 2 sets - 10 reps - Sit to Stand with Arms Crossed  - 1 x daily - 7 x weekly - 2 sets - 10 reps  GOALS: Goals reviewed with patient? Yes  SHORT TERM GOALS: ALL  STGS = LTGS  LONG TERM GOALS: Target date: 01/09/2023  Pt will be independent with final HEP with family supervision in order to build upon functional gains made in therapy. Baseline:  Goal status: INITIAL  2.  Pt will undergo trials of different LLE AFOs in order to determine best AFO to improve gait mechanics, decr fall risk, and LLE foot clearance.  Baseline:  Goal status: INITIAL  3.  Pt will improve BERG to at least a 40/56 in order to demo decr fall risk.  Baseline: 35/56 Goal status: INITIAL    ASSESSMENT:  CLINICAL IMPRESSION: Continued to practice with PLS AFOs to LLE today, per pt request (also trialed at last session). Trialed toe cap with L AFO, which appeared to be the best option for pt due to poor foot clearance. PT does still have to remind pt to slow down and pick up his L foot  (which does seem to have the best results). Pt still with foot scuffing through with AFO and toe cap when ambulating quickly at times and not focusing on picking up his foot (but overall appears better than with no AFO or toe cap). Pt also reports it feels better with his walking with use of AFO. PT to place order request to neurologist for L AFO. Will continue per POC.    OBJECTIVE IMPAIRMENTS: Abnormal gait, decreased activity tolerance, decreased balance, decreased cognition, decreased mobility, difficulty walking, decreased strength, impaired flexibility, postural dysfunction, and pain.   ACTIVITY LIMITATIONS: standing, transfers, dressing, and locomotion level  PARTICIPATION LIMITATIONS: meal prep, cleaning, laundry, driving, and community activity  PERSONAL FACTORS: Age, Behavior pattern, Past/current experiences, Time since onset of injury/illness/exacerbation, and 3+ comorbidities: multiple sclerosis, schizophrenia, obesity, HTN, migraine, ADHD  are also affecting patient's functional outcome.   REHAB POTENTIAL: Fair due to co-morbidities   CLINICAL DECISION MAKING: Evolving/moderate complexity  EVALUATION COMPLEXITY: Moderate  PLAN:  PT FREQUENCY: 1x/week  PT DURATION: 4 weeks  PLANNED INTERVENTIONS: Therapeutic exercises, Therapeutic activity, Neuromuscular re-education, Balance training, Gait training, Patient/Family education, Self Care, Joint mobilization, Stair training, Vestibular training, Orthotic/Fit training, DME instructions, and Re-evaluation  PLAN FOR NEXT SESSION: maybe just toe cap instead of AFO??  update HEP prn for functional strengthening, balance, neck stretches/posture exercises.  Pt enjoys [employer].  Plan for D/C?    Drake Leach, PT, DPT  01/01/2023, 10:00 AM

## 2022-12-31 NOTE — Telephone Encounter (Signed)
Dr. Epimenio Foot, Brandon [name] is being treated by physical therapy for MS.  He will benefit from use of L AFO in order to improve safety with functional mobility and improve gait mechanics/decr fall risk.    If you agree, please submit request in EPIC under MD Order, Other Orders (list L AFO in comments) or fax to [facility] at [phone].   He will also need written documentation in a physician's note on why he needs an AFO - I saw that he last saw you in Jan of this year, so he will probably be calling your office to make a new appt for an AFO.   Thank you so much, Sherlie Ban, PT, DPT 12/31/22 5:03 PM     [facility] [address] [place], [place]  [address] Phone:  [phone] Fax:  [phone]

## 2023-01-07 ENCOUNTER — Ambulatory Visit: Payer: 59 | Admitting: Physical Therapy

## 2023-01-14 ENCOUNTER — Encounter: Payer: Self-pay | Admitting: Podiatry

## 2023-01-14 ENCOUNTER — Ambulatory Visit: Payer: 59 | Admitting: Podiatry

## 2023-01-14 DIAGNOSIS — G35 Multiple sclerosis: Secondary | ICD-10-CM | POA: Diagnosis not present

## 2023-01-14 DIAGNOSIS — M21372 Foot drop, left foot: Secondary | ICD-10-CM | POA: Diagnosis not present

## 2023-01-14 DIAGNOSIS — M79674 Pain in right toe(s): Secondary | ICD-10-CM

## 2023-01-14 DIAGNOSIS — M79675 Pain in left toe(s): Secondary | ICD-10-CM | POA: Diagnosis not present

## 2023-01-14 DIAGNOSIS — B351 Tinea unguium: Secondary | ICD-10-CM | POA: Diagnosis not present

## 2023-01-14 MED ORDER — MELOXICAM 15 MG PO TABS: 15.0000 mg | ORAL_TABLET | Freq: Every day | ORAL | 0 refills | Status: DC

## 2023-01-14 NOTE — Progress Notes (Signed)
  Subjective:  Patient ID: Brandon [name], male    DOB: 1990/08/27,   MRN: [id number]  Chief Complaint  Patient presents with   Foot Pain    Hallux bilateral and arch bilateral (R>L) - been in rehab for drop foot since June 7th 2024, has noticed pulling in the foot and big toe when he walks   New Patient (Initial Visit)    32 y.o. male presents for concern as above. Patient with a history of drop foot and MS. HE is currently in PT and sounds as if he uses an AFO in PT and this helps relates they are possible working on getting one through neurology.  . Denies any other pedal complaints. Denies n/v/f/c.   Past Medical History:  Diagnosis Date   ADHD (attention deficit hyperactivity disorder)    Bipolar 1 disorder (HCC)    Chronic back pain    Chronic constipation    Chronic neck pain    Hypertension    Multiple sclerosis (HCC) 05/20/2013   left sided weakness, dysarthria   Non-compliance    Obesity    Pulmonary embolism (HCC)    Schizophrenia (HCC)    Stroke (HCC)    left sided deficits - pt's mother denies this    Objective:  Physical Exam: Vascular: DP/PT pulses 2/4 bilateral. CFT <3 seconds. Normal hair growth on digits. No edema.  Skin. No lacerations or abrasions bilateral feet.  Musculoskeletal: MMT 5/5 right lower extremities in DF, PF, Inversion and Eversion. 4/5 for DF and inversion on the left. PF 5/5 and eversion 5/5. Deceased ROM in DF of ankle joint. Mild tenderness to dorsum of first MPJ and no pain with ROM.  Neurological: Sensation intact to light touch.   Assessment:   1. Left foot drop   2. Multiple sclerosis (HCC)   3. Pain due to onychomycosis of toenails of both feet      Plan:  Patient was evaluated and treated and all questions answered. -Discussed foot drop and capsulitis around first MPJ.  and  treatement options; conservative and  Surgical management; risks, benefits, alternatives discussed. All patient's questions answered. -Rx Meloxicam  provided. -Patient also requesting nail trim. Nails trimmed today without incident.  -Recommend continue with good supportive shoes and inserts. Continue with AFO.  -Patient to return to office in 3 months for rfc.    Louann Sjogren, DPM

## 2023-01-16 ENCOUNTER — Other Ambulatory Visit: Payer: Self-pay

## 2023-01-20 ENCOUNTER — Encounter (HOSPITAL_COMMUNITY): Payer: Self-pay | Admitting: Emergency Medicine

## 2023-01-20 ENCOUNTER — Emergency Department (HOSPITAL_COMMUNITY)
Admission: EM | Admit: 2023-01-20 | Discharge: 2023-01-20 | Disposition: A | Discharge status: Home / Self Care | Payer: 59 | Attending: Emergency Medicine | Admitting: Emergency Medicine

## 2023-01-20 ENCOUNTER — Other Ambulatory Visit: Payer: Self-pay

## 2023-01-20 ENCOUNTER — Emergency Department (HOSPITAL_COMMUNITY): Payer: 59

## 2023-01-20 DIAGNOSIS — Z7901 Long term (current) use of anticoagulants: Secondary | ICD-10-CM | POA: Diagnosis not present

## 2023-01-20 DIAGNOSIS — J00 Acute nasopharyngitis [common cold]: Secondary | ICD-10-CM | POA: Diagnosis not present

## 2023-01-20 DIAGNOSIS — R079 Chest pain, unspecified: Secondary | ICD-10-CM | POA: Diagnosis not present

## 2023-01-20 DIAGNOSIS — Z79899 Other long term (current) drug therapy: Secondary | ICD-10-CM | POA: Diagnosis not present

## 2023-01-20 DIAGNOSIS — I10 Essential (primary) hypertension: Secondary | ICD-10-CM | POA: Insufficient documentation

## 2023-01-20 LAB — BASIC METABOLIC PANEL
Anion gap: 8 (ref 5–15)
BUN: 16 mg/dL (ref 6–20)
CO2: 28 mmol/L (ref 22–32)
Calcium: 9 mg/dL (ref 8.9–10.3)
Chloride: 103 mmol/L (ref 98–111)
Creatinine, Ser: 1.11 mg/dL (ref 0.61–1.24)
GFR, Estimated: >60 mL/min (ref >60)
Glucose, Bld: 77 mg/dL (ref 70–99)
Potassium: 4.1 mmol/L (ref 3.5–5.1)
Sodium: 139 mmol/L (ref 135–145)

## 2023-01-20 LAB — CBC
HCT: 43 % (ref 39.0–52.0)
Hemoglobin: 13.1 g/dL (ref 13.0–17.0)
MCH: 26 pg (ref 26.0–34.0)
MCHC: 30.5 g/dL (ref 30.0–36.0)
MCV: 85.3 fL (ref 80.0–100.0)
Platelets: 156 10*3/uL (ref 150–400)
RBC: 5.04 MIL/uL (ref 4.22–5.81)
RDW: 15.2 % (ref 11.5–15.5)
WBC: 5.9 10*3/uL (ref 4.0–10.5)
nRBC: 0 % (ref 0.0–0.2)

## 2023-01-20 LAB — HEPATIC FUNCTION PANEL
ALT: 29 U/L (ref 0–44)
AST: 14 U/L — ABNORMAL LOW (ref 15–41)
Albumin: 3.8 g/dL (ref 3.5–5.0)
Alkaline Phosphatase: 73 U/L (ref 38–126)
Bilirubin, Direct: <0.1 mg/dL (ref 0.0–0.2)
Total Bilirubin: 0.6 mg/dL (ref 0.3–1.2)
Total Protein: 6.5 g/dL (ref 6.5–8.1)

## 2023-01-20 LAB — LIPASE, BLOOD: Lipase: 32 U/L (ref 11–51)

## 2023-01-20 LAB — TROPONIN I (HIGH SENSITIVITY)
Troponin I (High Sensitivity): 3 ng/L (ref <18)
Troponin I (High Sensitivity): 3 ng/L (ref <18)

## 2023-01-20 MED ORDER — ACETAMINOPHEN 500 MG PO TABS
1000.0000 mg | ORAL_TABLET | Freq: Once | ORAL | Status: AC
  Administered 2023-01-20: 1000 mg via ORAL
  Filled 2023-01-20: qty 2

## 2023-01-20 NOTE — Discharge Instructions (Addendum)
Thank you for allowing [place] to be a part of your care today.  You were evaluated in the ED for chest pain.   Your workup today is overall reassuring and does not show evidence of a heart attack or pneumonia.  Your blood work is also reassuring.    I recommend following up with your primary care doctor.  I have also placed a referral for cardiology.   Return to the ED if you develop sudden worsening of your symptoms or if you have any new concerns.

## 2023-01-20 NOTE — ED Provider Triage Note (Signed)
Emergency Medicine Provider Triage Evaluation Note  Brandon [name] , a 32 y.o. male  was evaluated in triage.  Pt complains of chest pain. Reports last two days has had sharp midsternal/epigastric pain that comes and goes. Lasts 25-30 minutes at a time. Associated nausea, no vomiting, no SOB, no cough, no fevers.  Review of Systems  Positive: Chest pain, nausea Negative: SOB, cough, fever  Physical Exam  BP (!) 150/119   Pulse 69   Temp 98.2 F (36.8 C) (Oral)   Resp 20   Ht 6\' 1"  (1.854 m)   Wt 127 kg   SpO2 100%   BMI 36.94 kg/m  Gen:   Awake, no distress   Resp:  Normal effort, normal lung sounds MSK:   Moves extremities without difficulty  Other:  Heart RRR  Medical Decision Making  Medically screening exam initiated at 11:21 AM.  Appropriate orders placed.  Brandon [name] was informed that the remainder of the evaluation will be completed by another provider, this initial triage assessment does not replace that evaluation, and the importance of remaining in the ED until their evaluation is complete.    Rexford Maus, DO 01/20/23 1123

## 2023-01-20 NOTE — ED Provider Notes (Signed)
[facility] EMERGENCY DEPARTMENT AT [facility] Provider Note   CSN: [id number] Arrival date & time: 01/20/23  1054     History  Chief Complaint  Patient presents with   Chest Pain    Brandon [name] is a 32 y.o. male with past medical history significant for hypertension, schizophrenia, bipolar 1 disorder, MS, stroke, ADHD, hypertension, intellectual disability presents [facility] the ED [facility] EMS [facility] adult [facility].  Patient complained of having chest pain and head cold over the past few days when he arrived at the day center.  Patient reports his chest is feeling better at time of ED arrival, but he does still have some pain that "comes and goes".  He states the pain is "around his heart".  He is currently asking for something to eat.  Denies shortness of breath, cough, rhinorrhea, chest tightness, fever, palpitations, abdominal pain, nausea, vomiting, diarrhea, syncope.  He is complaint with all of his medications including Eliquis.        Home Medications Prior to Admission medications   Medication Sig Start Date End Date Taking? Authorizing Provider  acetaminophen (TYLENOL) 500 MG tablet Take 1,000 mg by mouth 3 (three) times daily as needed for moderate pain.    [provider]  amantadine (SYMMETREL) 100 MG capsule Take 100 mg by mouth 2 (two) times daily. 09/25/21   [provider]  amLODipine (NORVASC) 5 MG tablet Take 5 mg by mouth in the morning. 03/17/22   [provider]  apixaban (ELIQUIS) 5 MG TABS tablet Take 1 tablet (5 mg total) by mouth 2 (two) times daily. 11/01/21   Clapacs, Jackquline Denmark, MD  benzonatate (TESSALON) 100 MG capsule Take 1 capsule (100 mg total) by mouth 3 (three) times daily as needed for cough. 12/01/22   Zenia Resides, MD  desvenlafaxine (PRISTIQ) 25 MG 24 hr tablet Take 25 mg by mouth daily. 12/02/22   [provider]  divalproex (DEPAKOTE) 250 MG DR tablet Take 3 tablets (750 mg total) by mouth at bedtime. 09/09/22  10/09/22  Earney Navy, NP  divalproex (DEPAKOTE) 500 MG DR tablet Take 1 tablet (500 mg total) by mouth daily at 12 noon. 09/09/22 10/09/22  Earney Navy, NP  docusate sodium (COLACE) 100 MG capsule Take 1 capsule (100 mg total) by mouth 2 (two) times daily. Patient taking differently: Take 100 mg by mouth in the morning, at noon, and at bedtime. 11/01/21   Clapacs, Jackquline Denmark, MD  DULCOLAX 5 MG EC tablet Take 15 mg by mouth at bedtime.    [provider]  hydrOXYzine (ATARAX) 25 MG tablet Take 25 mg by mouth 3 (three) times daily.    [provider]  loratadine (CLARITIN) 10 MG tablet Take 1 tablet (10 mg total) by mouth daily as needed for allergies. Patient taking differently: Take 10 mg by mouth at bedtime. 11/01/21   Clapacs, Jackquline Denmark, MD  losartan (COZAAR) 50 MG tablet Take 1 tablet (50 mg total) by mouth 2 (two) times daily. 11/01/21   Clapacs, Jackquline Denmark, MD  LUMIGAN 0.01 % SOLN Place 1 drop into both eyes at bedtime. 11/01/21   Clapacs, Jackquline Denmark, MD  meloxicam (MOBIC) 15 MG tablet Take 1 tablet (15 mg total) by mouth daily. 01/14/23   Louann Sjogren, DPM  metFORMIN (GLUCOPHAGE-XR) 750 MG 24 hr tablet Take by mouth. 11/27/22   [provider]  Multiple Vitamins-Minerals (MULTIVITAMIN WITH MINERALS) tablet Take 1 tablet by mouth daily.  [provider]  NICORETTE 2 MG gum Take 2 mg by mouth as needed for smoking cessation.    [provider]  ocrelizumab (OCREVUS) 300 MG/10ML injection Inject 2 vials ( 20 MLs) into the vein every 6 months Patient taking differently: Inject 600 mg into the vein See admin instructions. Infuse 600 mg every 8 months 07/31/22   Sater, Pearletha Furl, MD  OLANZapine (ZYPREXA) 15 MG tablet Take 15 mg by mouth at bedtime.    [provider]  paliperidone (INVEGA SUSTENNA) 156 MG/ML SUSY injection Inject 1 mL (156 mg total) into the muscle once for 1 dose. 09/12/22 09/12/22  Earney Navy, NP  paliperidone (INVEGA) 9 MG 24  hr tablet Take 1 tablet (9 mg total) by mouth daily for 7 days. 09/10/22 09/17/22  Earney Navy, NP  QUVIVIQ 50 MG TABS Take 1 tablet by mouth daily. 01/02/23   [provider]  temazepam (RESTORIL) 7.5 MG capsule Take 1 capsule (7.5 mg total) by mouth at bedtime as needed for sleep. 09/09/22   Earney Navy, NP  topiramate (TOPAMAX) 25 MG tablet TAKE ONE TABLET BY MOUTH TWICE A DAY Patient not taking: Reported on 09/01/2022 01/23/22   Glean Salvo, NP  traZODone (DESYREL) 100 MG tablet Take 100 mg by mouth at bedtime. 11/29/22   [provider]  gabapentin (NEURONTIN) 400 MG capsule Take 400 mg by mouth 3 (three) times daily. 12/26/20 01/14/21  [provider]  propranolol (INDERAL) 10 MG tablet Take 1 tablet (10 mg total) by mouth 2 (two) times daily. 05/03/15 01/03/20  Charm Rings, NP      Allergies    Divalproex sodium    Review of Systems   Review of Systems  Constitutional:  Negative for fever.  HENT:  Positive for congestion. Negative for rhinorrhea.   Respiratory:  Negative for cough, chest tightness and shortness of breath.   Cardiovascular:  Positive for chest pain. Negative for palpitations.  Gastrointestinal:  Negative for abdominal pain, diarrhea, nausea and vomiting.  Neurological:  Negative for syncope.    Physical Exam Updated Vital Signs BP (!) 152/117   Pulse 74   Temp (!) 97.5 F (36.4 C) (Oral)   Resp 17   Ht 6\' 1"  (1.854 m)   Wt 127 kg   SpO2 100%   BMI 36.94 kg/m  Physical Exam Vitals and nursing note reviewed.  Constitutional:      General: He is not in acute distress.    Appearance: He is not ill-appearing.  HENT:     Mouth/Throat:     Mouth: Mucous membranes are moist.     Pharynx: Oropharynx is clear.  Cardiovascular:     Rate and Rhythm: Normal rate and regular rhythm.     Pulses: Normal pulses.     Heart sounds: Normal heart sounds.  Pulmonary:     Effort: Pulmonary effort is normal. No respiratory  distress.     Breath sounds: Normal breath sounds and air entry.     Comments: Patient does not take deep breaths, even with coaching.  Lung sounds auscultated are clear.  Abdominal:     General: Abdomen is flat. Bowel sounds are normal. There is no distension.     Palpations: Abdomen is soft.     Tenderness: There is no abdominal tenderness.  Musculoskeletal:     Right lower leg: No edema.     Left lower leg: No edema.  Skin:    General: Skin is  warm and dry.     Capillary Refill: Capillary refill takes less than 2 seconds.  Neurological:     Mental Status: He is alert. Mental status is at baseline.  Psychiatric:        Mood and Affect: Mood normal.        Behavior: Behavior normal.     ED Results / Procedures / Treatments   Labs (all labs ordered are listed, but only abnormal results are displayed) Labs Reviewed  HEPATIC FUNCTION PANEL - Abnormal; Notable for the following components:      Result Value   AST 14 (*)    All other components within normal limits  BASIC METABOLIC PANEL  CBC  LIPASE, BLOOD  TROPONIN I (HIGH SENSITIVITY)  TROPONIN I (HIGH SENSITIVITY)    EKG EKG Interpretation Date/Time:  Tuesday January 20 2023 11:06:55 EDT Ventricular Rate:  69 PR Interval:  118 QRS Duration:  94 QT Interval:  364 QTC Calculation: 390 R Axis:   13  Text Interpretation: Normal sinus rhythm Cannot rule out Anterior infarct , age undetermined Abnormal ECG No significant change since last tracing Confirmed by Elayne Snare ([id number]) on 01/20/2023 11:18:46 AM  Radiology DG Chest 2 View  Result Date: 01/20/2023 CLINICAL DATA:  Chest pain EXAM: CHEST - 2 VIEW COMPARISON:  Chest radiograph dated August 12, 2022 FINDINGS: The heart size and mediastinal contours are within normal limits. Elevation of the right hemidiaphragm. Low lung volumes without evidence of focal consolidation or pleural effusion. The visualized skeletal structures are unremarkable. IMPRESSION: Low lung  volumes without evidence of acute cardiopulmonary disease. Electronically Signed   By: Larose Hires D.O.   On: 01/20/2023 13:25    Procedures Procedures    Medications Ordered in ED Medications  acetaminophen (TYLENOL) tablet 1,000 mg (1,000 mg Oral Given 01/20/23 1353)    ED Course/ Medical Decision Making/ A&P                             Medical Decision Making Amount and/or Complexity of Data Reviewed Labs: ordered. Radiology: ordered.  Risk OTC drugs.   This patient presents to the ED with chief complaint(s) of chest pain, congestion with pertinent past medical history of chronic anticoagulation, stroke, PE, MS.  The complaint involves an extensive differential diagnosis and also carries with it a high risk of complications and morbidity.    The differential diagnosis includes ACS, PE, stable angina, anxiety, pneumonia, other respiratory illness, costochondritis   The initial plan is to obtain chest pain work-up  Initial Assessment:   Exam significant for overall well-appearing patient who is not in acute distress.  Patient is alert and at his baseline.  Lungs are clear to auscultation, however, patient is not taking deep breaths.  Patient does not appear to be in respiratory distress and he is not tachypneic.  Heart rate is normal in the 60s with regular rhythm.  No pedal edema.  Abdomen is soft and non tender to palpation.   Independent ECG/labs interpretation:  The following labs were independently interpreted:  CBC without leukocytosis or anemia.  Metabolic panel with normal renal function, no electrolyte disturbance.  Hepatic function unremarkable.  Initial troponin 3, repeat 3. Delta of 0.  Independent visualization and interpretation of imaging: I independently visualized the following imaging with scope of interpretation limited to determining acute life threatening conditions related to emergency care: chest x-ray, which revealed no evidence of acute cardiopulmonary  disease, lung volumes  are low, elevation of right hemidiaphragm.  Treatment and Reassessment: Patient given Tylenol for chest pain with improvement of his symptoms.   Disposition:   Based on physical exam, ECG, and workup, low suspicion for ACS at this time.  Patient has remained stable while in the ED.  He was able to eat and drink without difficulty.  Patient is mentating at his baseline.  I feel that patient is appropriate for discharge home with cardiology follow up in the outpatient setting.   The patient has been appropriately medically screened and/or stabilized in the ED. I have low suspicion for any other emergent medical condition which would require further screening, evaluation or treatment in the ED or require inpatient management. At time of discharge the patient is hemodynamically stable and in no acute distress. I have discussed work-up results and diagnosis with patient and answered all questions. Patient is agreeable with discharge plan. We discussed strict return precautions for returning to the emergency department and they verbalized understanding.           Final Clinical Impression(s) / ED Diagnoses Final diagnoses:  Chest pain, unspecified type    Rx / DC Orders ED Discharge Orders          Ordered    Ambulatory referral to Cardiology       Comments: If you have not heard from the Cardiology office within the next 72 hours please call [phone].   01/20/23 1524              Lenard Simmer, PA-C 01/20/23 1525    Gloris Manchester, MD 01/20/23 [phone]

## 2023-01-20 NOTE — ED Triage Notes (Signed)
Per [facility] pt coming from adult day center- when he got there he complained of having chest pain and a head cold over the past few days. Patient states his chest is feeling better on arrival.

## 2023-01-20 NOTE — ED Notes (Signed)
Pt picked up by Sallye Ober

## 2023-01-20 NOTE — ED Notes (Signed)
RN called Brandon [name] to pick up Brandon [name] states she will pick up pt in 10 minutes

## 2023-01-21 ENCOUNTER — Ambulatory Visit: Payer: 59 | Attending: Family Medicine | Admitting: Physical Therapy

## 2023-01-21 ENCOUNTER — Encounter: Payer: Self-pay | Admitting: Physical Therapy

## 2023-01-21 DIAGNOSIS — R2681 Unsteadiness on feet: Secondary | ICD-10-CM | POA: Insufficient documentation

## 2023-01-21 DIAGNOSIS — R2689 Other abnormalities of gait and mobility: Secondary | ICD-10-CM | POA: Insufficient documentation

## 2023-01-21 DIAGNOSIS — R29818 Other symptoms and signs involving the nervous system: Secondary | ICD-10-CM | POA: Insufficient documentation

## 2023-01-21 DIAGNOSIS — M6281 Muscle weakness (generalized): Secondary | ICD-10-CM | POA: Insufficient documentation

## 2023-01-21 NOTE — Therapy (Signed)
OUTPATIENT PHYSICAL THERAPY NEURO TREATMENT - DISCHARGE SUMMARY   Patient Name: Brandon [name] MRN: [id number] DOB:05-20-1991, 32 y.o., male Today's Date: 01/21/2023   PCP: Brandon Rochester, MD  REFERRING PROVIDER: Stevphen Rochester, MD   PHYSICAL THERAPY DISCHARGE SUMMARY  Visits from Start of Care: 5  Current functional level related to goals / functional outcomes: See clinical impression statement.   Remaining deficits: Fluctuation in balance, poor left foot clearance - referral for AFO and toe cap   Education / Equipment: See therapeutic activity section below.  Progress towards goals and plan for discharge.  Return w/ new referral as needed.  Discussed fluctuation in balance is likely in future related to sustained attention to task, impulsivity, interest in task, pain in neck, and general fatigue.   Patient agrees to discharge. Patient goals were partially met. Patient is being discharged due to maximized rehab potential.   END OF SESSION:  PT End of Session - 01/21/23 1602     Visit Number 5    Number of Visits 5    Date for PT Re-Evaluation 01/11/23    Authorization Type [employer] Medicare    PT Start Time 1615    PT Stop Time 1645    PT Time Calculation (min) 30 min    Equipment Utilized During Treatment Gait belt    Activity Tolerance Patient tolerated treatment well    Behavior During Therapy WFL for tasks assessed/performed;Impulsive             Past Medical History:  Diagnosis Date   ADHD (attention deficit hyperactivity disorder)    Bipolar 1 disorder (HCC)    Chronic back pain    Chronic constipation    Chronic neck pain    Hypertension    Multiple sclerosis (HCC) 05/20/2013   left sided weakness, dysarthria   Non-compliance    Obesity    Pulmonary embolism (HCC)    Schizophrenia (HCC)    Stroke (HCC)    left sided deficits - pt's mother denies this   Past Surgical History:  Procedure Laterality Date   NO PAST SURGERIES     None      RADIOLOGY WITH ANESTHESIA N/A 01/16/2021   Procedure: MRI WITH ANESTHESIA CERVICAL AND THORASIC SPINE WITH AND WITHOUT CONTRAST;  Surgeon: Radiologist, Medication, MD;  Location: [facility] OR;  Service: Radiology;  Laterality: N/A;   TOOTH EXTRACTION N/A 06/24/2019   Procedure: DENTAL RESTORATION/EXTRACTION OF TEETH NUMBER ONE, SIXTEEN, SEVENTEEN, NINETEEN, THIRTY-TWO;  Surgeon: Brandon [name], DDS;  Location: [facility] OR;  Service: Oral Surgery;  Laterality: N/A;   Patient Active Problem List   Diagnosis Date Noted   Prediabetes 11/27/2022   Involuntary commitment 04/03/2022   Schizophrenia, 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   Aggressive behavior 08/13/2021   Acute alcohol intoxication (HCC) 07/17/2021   Schizophrenia, 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HCC) 03/05/2021   Arthralgia of left temporomandibular joint 10/08/2020   Referred otalgia of left ear 10/08/2020   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   Unspecified intellectual disabilities 01/27/2020   Weight gain due to medication 11/07/2019   Anticoagulated 05/13/2019   Migraine headache 04/18/2019   Generalized weakness    Health care home, active care coordination 10/06/2017   Multiple sclerosis (HCC) 05/20/2013   Abnormal findings on diagnostic imaging of skull and head, not elsewhere classified 11/23/2012   HTN (hypertension) 07/04/2011   Ataxia 07/02/2011   Obesity, Class III, BMI 40-49.9 (morbid obesity) (HCC) 01/14/2010   Attention deficit hyperactivity disorder (ADHD) 01/14/2010    ONSET DATE: 11/28/2022  REFERRING DIAG: R26.89 (ICD-10-CM) - Balance disorder S16.1XXA (ICD-10-CM) - Neck strain    THERAPY DIAG:  Unsteadiness on feet  Muscle weakness (generalized)  Other abnormalities of gait and mobility  Other symptoms and signs involving the nervous system  Rationale for Evaluation and Treatment: Rehabilitation  SUBJECTIVE:                                                                                                                                                                                              SUBJECTIVE STATEMENT: Patient denies falls since prior visit.  Grandmother states she has not heard from [facility] nor has any knowledge of them needing to get in touch with this company.  Patient states his chest pain is resolved from prior ED visit. Pt accompanied by: self and grandmother  PERTINENT HISTORY: PMH: multiple sclerosis, schizophrenia, obesity, HTN, migraine, ADHD  PAIN:  Are you having pain? No  PRECAUTIONS: Fall  WEIGHT BEARING RESTRICTIONS: No  FALLS: Has patient fallen in last 6 months? Yes. Number of falls 2  LIVING ENVIRONMENT: Lives with:  lives with mom and grandma  Lives in: House/apartment Stairs: Yes: External: 3 steps; on right going up Has following equipment at home: None  PLOF: Independent with household mobility without device, Needs assistance with ADLs, and Leisure: pt likes cars  Needs help with dressing, meals, chores   PATIENT GOALS: Wants to walk a lot better and lose weight   OBJECTIVE:   DIAGNOSTIC FINDINGS: CT head 06/2022: IMPRESSION: 1. No acute intracranial process. 2. Stable atrophy and chronic small vessel ischemic changes.  COGNITION: Overall cognitive status: Impaired and takes incr time to answer questions.    SENSATION: WFL  COORDINATION: Heel to shin: less coordinated with LLE    POSTURE: rounded shoulders, forward head, posterior pelvic tilt, weight shift right, and head tilted to the R   LOWER EXTREMITY MMT:    MMT Right Eval Left Eval  Hip flexion 4 4  Hip extension    Hip abduction 5 5  Hip adduction 5 5  Hip internal rotation    Hip external rotation    Knee flexion 4+ 4+  Knee extension 5 5  Ankle dorsiflexion 5 5  Ankle plantarflexion    Ankle inversion    Ankle eversion    (Blank rows = not tested)   TRANSFERS: Assistive device utilized: None  Sit to stand: SBA Stand to sit: SBA Pt with decr eccentric control when  sitting, does not stand fully erect.   GAIT: Gait pattern: step through pattern, decreased arm swing- Right, decreased arm swing- Left, decreased stride length, decreased hip/knee flexion-  Left, decreased ankle dorsiflexion- Left, decreased trunk rotation, trunk flexed, and wide BOS Distance walked: Clinic distances  Assistive device utilized: None Level of assistance: CGA Comments: Pt ambulates very quickly, cued to slow down. Pt with decr foot clearance with LLE and almost catching his foot multiple times and hearing LLE foot scuffing throughout   FUNCTIONAL TESTS:  5 times sit to stand: 13.35 seconds with no UE support, decr eccentric control, does not sit down all the way.   10 meter walk test: 10.4 seconds = 3.15 ft/sec   SLS: LLE: <1 second, RLE: 3.4 seconds    [facility] PT Assessment - 01/21/23 1632       Berg Balance Test   Sit to Stand Able to stand without using hands and stabilize independently    Standing Unsupported Able to stand safely 2 minutes    Sitting with Back Unsupported but Feet Supported on Floor or Stool Able to sit safely and securely 2 minutes    Stand to Sit Sits safely with minimal use of hands    Transfers Able to transfer safely, minor use of hands    Standing Unsupported with Eyes Closed Able to stand 10 seconds safely    Standing Unsupported with Feet Together Able to place feet together independently and stand for 1 minute with supervision    From Standing, Reach Forward with Outstretched Arm Can reach confidently >25 cm (10")    From Standing Position, Pick up Object from Floor Able to pick up shoe safely and easily    From Standing Position, Turn to Look Behind Over each Shoulder Looks behind from both sides and weight shifts well    Turn 360 Degrees Needs close supervision or verbal cueing   patient turns with excessive lateral lean and swift movement reporting he made himself dizzy upon turning.   Standing Unsupported, Alternately Place Feet on  Step/Stool Able to complete 4 steps without aid or supervision   needs cues for safety due to increased speed of performance   Standing Unsupported, One Foot in Front Able to take small step independently and hold 30 seconds    Standing on One Leg Tries to lift leg/unable to hold 3 seconds but remains standing independently   3 seconds in left stance   Total Score 45    Berg comment: 45/56 = significant fall risk                TODAY'S TREATMENT:                                                                                                                               Therapeutic Activity: Re-iterated contacting [facility] about appt, as PT went to get contact info for grandmother she called patient's mother and confirmed appt scheduled 7/31.  Explained process of having toe cap added to shoe and needing to bring tennis shoes to appt for them to add to these shoes vs current rubber style  shoes he is wearing.  Provided front office number for contacting if [facility] is missing any information, confirmed w/ front office that PT notes were successfully faxed. Reviewed HEP Cervical side bending stretch x45 seconds each side Seated levator scapulae stretch x45 seconds each side Cervical rotation AROM x15 bilaterally Seated scapular retraction, PT provides tactile cues for form and verbal correction of posterior left lean, x12 STS w/ arms crossed x6, x4 Re-printed x2 copies for grandmother and patient per grandmother's request. BERG:  [facility] PT Assessment - 01/21/23 1632       Berg Balance Test   Sit to Stand Able to stand without using hands and stabilize independently    Standing Unsupported Able to stand safely 2 minutes    Sitting with Back Unsupported but Feet Supported on Floor or Stool Able to sit safely and securely 2 minutes    Stand to Sit Sits safely with minimal use of hands    Transfers Able to transfer safely, minor use of hands    Standing Unsupported with Eyes Closed Able to  stand 10 seconds safely    Standing Unsupported with Feet Together Able to place feet together independently and stand for 1 minute with supervision    From Standing, Reach Forward with Outstretched Arm Can reach confidently >25 cm (10")    From Standing Position, Pick up Object from Floor Able to pick up shoe safely and easily    From Standing Position, Turn to Look Behind Over each Shoulder Looks behind from both sides and weight shifts well    Turn 360 Degrees Needs close supervision or verbal cueing   patient turns with excessive lateral lean and swift movement reporting he made himself dizzy upon turning.   Standing Unsupported, Alternately Place Feet on Step/Stool Able to complete 4 steps without aid or supervision   needs cues for safety due to increased speed of performance   Standing Unsupported, One Foot in Front Able to take small step independently and hold 30 seconds    Standing on One Leg Tries to lift leg/unable to hold 3 seconds but remains standing independently   3 seconds in left stance   Total Score 45    Berg comment: 45/56 = significant fall risk            PATIENT EDUCATION: Education details: See therapeutic activity section above.  Progress towards goals and plan for discharge.  Return w/ new referral as needed.  Discussed fluctuation in balance is likely in future related to sustained attention to task, impulsivity, interest in task, pain in neck, and general fatigue. Person educated: Patient and Parent-pt's grandmother Education method: Explanation Education comprehension: verbalized understanding  HOME EXERCISE PROGRAM: Access Code: [email or web address] URL: [email or web address] Date: 12/17/2022 Prepared by: Camille Bal  Exercises - Seated Cervical Sidebending Stretch  - 1 x daily - 7 x weekly - 1 sets - 2 reps - 45 seconds hold - Seated Levator Scapulae Stretch  - 1 x daily - 7 x weekly - 1 sets - 2 reps - 45 seconds hold - Seated Cervical  Rotation AROM  - 1 x daily - 7 x weekly - 2 sets - 10 reps - Seated Scapular Retraction  - 1 x daily - 7 x weekly - 2 sets - 10 reps - Sit to Stand with Arms Crossed  - 1 x daily - 7 x weekly - 2 sets - 10 reps  GOALS: Goals reviewed with patient? Yes  SHORT TERM GOALS: ALL STGS = LTGS  LONG TERM GOALS: Target date: 01/09/2023  Pt will be independent with final HEP with family supervision in order to build upon functional gains made in therapy. Baseline: Pt intermittently compliant per report (7/17) Goal status: PARTIALLY MET  2.  Pt will undergo trials of different LLE AFOs in order to determine best AFO to improve gait mechanics, decr fall risk, and LLE foot clearance.  Baseline: AFO and toe cap order placed (7/17) Goal status: MET  3.  Pt will improve BERG to at least a 40/56 in order to demo decr fall risk.  Baseline: 35/56; 45/56 (7/17) Goal status: MET    ASSESSMENT:  CLINICAL IMPRESSION: Goals assessed this visit in preparation for discharge which patient and grandmother are in agreement to today.  He met 2 of 3 goals with him demonstrating some compliance to established HEP focused on managing neck discomfort.  A left AFO and toe cap order have been placed with patient having a scheduled appt at [facility] on 7/31.  His BERG showed significant improvement today to 45/56 partially attributable to improved attention to task today and no reported pain or fatigue.  He has been educated throughout episode of care regarding safety and deficits noted.  He would likely benefit from episodic care in the future with new referral as needed and appropriate.  OBJECTIVE IMPAIRMENTS: Abnormal gait, decreased activity tolerance, decreased balance, decreased cognition, decreased mobility, difficulty walking, decreased strength, impaired flexibility, postural dysfunction, and pain.   ACTIVITY LIMITATIONS: standing, transfers, dressing, and locomotion level  PARTICIPATION LIMITATIONS: meal prep,  cleaning, laundry, driving, and community activity  PERSONAL FACTORS: Age, Behavior pattern, Past/current experiences, Time since onset of injury/illness/exacerbation, and 3+ comorbidities: multiple sclerosis, schizophrenia, obesity, HTN, migraine, ADHD  are also affecting patient's functional outcome.   REHAB POTENTIAL: Fair due to co-morbidities   CLINICAL DECISION MAKING: Evolving/moderate complexity  EVALUATION COMPLEXITY: Moderate  PLAN:  PT FREQUENCY: 1x/week  PT DURATION: 4 weeks  PLANNED INTERVENTIONS: Therapeutic exercises, Therapeutic activity, Neuromuscular re-education, Balance training, Gait training, Patient/Family education, Self Care, Joint mobilization, Stair training, Vestibular training, Orthotic/Fit training, DME instructions, and Re-evaluation  PLAN FOR NEXT SESSION: N/A  Sadie Haber, PT, DPT  01/21/2023, 4:55 PM

## 2023-02-14 ENCOUNTER — Other Ambulatory Visit: Payer: Self-pay | Admitting: Podiatry

## 2023-02-19 ENCOUNTER — Telehealth: Payer: Self-pay | Admitting: Neurology

## 2023-02-19 NOTE — Telephone Encounter (Signed)
Form pending to be signed.

## 2023-02-19 NOTE — Telephone Encounter (Signed)
Medicaid form signed and re-faxed to the below #, confirmation received.

## 2023-02-19 NOTE — Telephone Encounter (Signed)
Melissa at [facility] is asking that the medicaid form be faxed back to them once completed, they are asking it be faxed [phone]

## 2023-03-02 ENCOUNTER — Ambulatory Visit: Payer: 59 | Admitting: Cardiovascular Disease

## 2023-03-02 ENCOUNTER — Encounter: Payer: Self-pay | Admitting: Cardiovascular Disease

## 2023-03-02 VITALS — BP 124/88 | HR 101 | Ht 73.0 in | Wt 325.0 lb

## 2023-03-02 DIAGNOSIS — R072 Precordial pain: Secondary | ICD-10-CM | POA: Diagnosis not present

## 2023-03-02 MED ORDER — METOPROLOL TARTRATE 100 MG PO TABS: 100.0000 mg | ORAL_TABLET | Freq: Once | ORAL | 0 refills | Status: DC

## 2023-03-02 NOTE — Patient Instructions (Signed)
Medication Instructions:   Your physician recommends that you continue on your current medications as directed. Please refer to the Current Medication list given to you today.  *If you need a refill on your cardiac medications before your next appointment, please call your pharmacy*     Testing/Procedures:    Your cardiac CT will be scheduled at one of the below locations:   [facility] [address] [place], [place] [address] [phone]    If scheduled at [facility], please arrive at the [facility] Entrance (Entrance C2) of [facility] 30 minutes prior to test start time. You can use the FREE valet parking offered at entrance C (encouraged to control the heart rate for the test)  Proceed to the [facility] Radiology Department (first floor) to check-in and test prep.  All radiology patients and guests should use entrance C2 at [facility], accessed from [facility], even though the hospital's physical address listed is [address]     There is spacious parking and easy access to the radiology department from the [facility] entrance. Please enter here and check-in with the desk attendant.   Please follow these instructions carefully (unless otherwise directed):  An IV will be required for this test and Nitroglycerin will be given.  Hold all erectile dysfunction medications at least 3 days (72 hrs) prior to test. (Ie viagra, cialis, sildenafil, tadalafil, etc)   On the Night Before the Test: Be sure to Drink plenty of water. Do not consume any caffeinated/decaffeinated beverages or chocolate 12 hours prior to your test. Do not take any antihistamines 12 hours prior to your test.  DO NOT TAKE LORATADINE (CLARITIN) 12 HOURS PRIOR TO THIS TEST   On the Day of the Test: Drink plenty of water until 1 hour prior to the test. Do not eat any food 1 hour prior to test. You may take your  regular medications prior to the test.  Take metoprolol 100 MG BY MOUTH (Lopressor) two hours prior to test.         After the Test: Drink plenty of water. After receiving IV contrast, you may experience a mild flushed feeling. This is normal. On occasion, you may experience a mild rash up to 24 hours after the test. This is not dangerous. If this occurs, you can take Benadryl 25 mg and increase your fluid intake. If you experience trouble breathing, this can be serious. If it is severe call 911 IMMEDIATELY. If it is mild, please call our office. If you take any of these medications: Glipizide/Metformin, Avandament, Glucavance, please do not take 48 hours after completing test unless otherwise instructed.  We will call to schedule your test 2-4 weeks out understanding that some insurance companies will need an authorization prior to the service being performed.   For more information and frequently asked questions, please visit our website : [email or web address]  For non-scheduling related questions, please contact the cardiac imaging nurse navigator should you have any questions/concerns: Cardiac Imaging Nurse Navigators Direct Office Dial: [phone]   For scheduling needs, including cancellations and rescheduling, please call [place], [phone].    Follow-Up:  AS NEEDED FOLLOW-UP WITH DR. Elease Hashimoto

## 2023-03-02 NOTE — Progress Notes (Signed)
  Cardiology Office Note:  .   Date:  03/02/2023  ID:  MARKEN KINTZ, DOB Sep 18, 1990, MRN [id number] PCP: Medicine, [facility] Providers Cardiologist:  Harvard Zeiss  Click to update primary MD,subspecialty MD or APP then REFRESH:1}   History of Present Illness: .   JAMARLON HENSLER is a 32 y.o. male with hx of obesity , HTN, DM,  multiple sclerosis,    Seen with mother Renett.   Intermittant chest pain  Has been to the ER 10 times this year    Pain comes and goes, Usually with mental stress Is able to exercise, no CP with exertion  Pain is mid sternal ,  Last minutes to hours Resolve spontaneously   Has had a sleep study.  Mother does not think he has OSA    Mat GM had MI in her 37s    Has had lipids measured at [facility] in May, 2024.  His total cholesterol is 148, triglyceride levels 212, HDL 34, LDL 78.   ROS:   Studies Reviewed: Marland Kitchen    EKG January 20, 2023: Normal sinus rhythm.  Poor R wave progression.  No ST or T wave changes. Risk Assessment/Calculations:             Physical Exam:   VS:  BP 124/88 (BP Location: Left Wrist, Patient Position: Sitting, Cuff Size: Large)   Pulse (!) 101   Ht 6\' 1"  (1.854 m)   Wt (!) 325 lb (147.4 kg)   SpO2 96%   BMI 42.88 kg/m    Wt Readings from Last 3 Encounters:  03/02/23 (!) 325 lb (147.4 kg)  01/20/23 280 lb (127 kg)  08/12/22 280 lb (127 kg)    GEN: morbidly obese young male, in no acute distress NECK: No JVD; No carotid bruits CARDIAC: RRR, no murmurs, rubs, gallops RESPIRATORY:  Clear to auscultation without rales, wheezing or rhonchi  ABDOMEN: Soft, non-tender, non-distended EXTREMITIES:  No edema; No deformity  Neuro:  multiple sclerosis , speech is difficult to understand   ASSESSMENT AND PLAN: .   Chest pain: [name] presents with his mother for further evaluation of chest pain.  His episodes of chest pain sounds somewhat atypical but he does have a family history of coronary  artery disease.  He has had multiple visits to the emergency room. I think the best solution to this is to rule out coronary artery disease as a potential problem.  Will schedule him for a coronary CT angiogram.  If the coronary CT angiogram does not show significant coronary artery disease then we can rest assured that his frequent episodes of chest pain are noncardiac.  Will see him on an as-needed basis.  He will continue to follow with his primary medical doctor.  2.  Morbid obesity: I have asked the mother about reducing his caloric intake.  He spends most of his days at a adult group home.  His mother says that he steals others' food and limiting his calorie intake has been nearly impossible.  He has a poor understanding of his health issues.        Dispo: PRN   Signed, Kristeen Miss, MD

## 2023-03-04 ENCOUNTER — Ambulatory Visit (HOSPITAL_COMMUNITY)
Admission: RE | Admit: 2023-03-04 | Discharge: 2023-03-04 | Disposition: A | Discharge status: Home / Self Care | Payer: 59 | Source: Ambulatory Visit | Attending: Physician Assistant | Admitting: Physician Assistant

## 2023-03-04 ENCOUNTER — Encounter (HOSPITAL_COMMUNITY): Payer: Self-pay

## 2023-03-04 VITALS — BP 128/91 | HR 91 | Temp 98.5°F | Resp 18

## 2023-03-04 DIAGNOSIS — H9201 Otalgia, right ear: Secondary | ICD-10-CM | POA: Diagnosis not present

## 2023-03-04 DIAGNOSIS — T161XXA Foreign body in right ear, initial encounter: Secondary | ICD-10-CM

## 2023-03-04 DIAGNOSIS — H60391 Other infective otitis externa, right ear: Secondary | ICD-10-CM | POA: Diagnosis not present

## 2023-03-04 MED ORDER — OFLOXACIN 0.3 % OT SOLN: 5.0000 [drp] | Freq: Two times a day (BID) | OTIC | 0 refills | Status: DC

## 2023-03-04 NOTE — Discharge Instructions (Signed)
We were able to remove the tissue from his ear canal.  I am concerned that there is an infection of his ear canal.  Use ofloxacin drops twice daily for the next 7 days.  Do not put anything in the ear including earbuds, Q-tips, earplugs until symptoms resolve.  Do not submerge your head in water until symptoms resolve.  Follow-up with primary care if symptoms have not improved.  If anything worsens and you have drainage from the ear, fever, nausea, vomiting, increasing pain you need to be seen immediately.

## 2023-03-04 NOTE — ED Triage Notes (Signed)
Patient with c/o BL ear pain for a few days. States right is worse than left.

## 2023-03-04 NOTE — ED Provider Notes (Signed)
[facility]    CSN: [id number] Arrival date & time: 03/04/23  1038      History   Chief Complaint Chief Complaint  Patient presents with   Otalgia    HPI Brandon [name] is a 32 y.o. male.   Patient presents today companied by his mother help provide the majority of history.  Reports for the past 3 days he has been complaining of bilateral otalgia that is worse on the right.  Reports pain is rated 9 on a 0-10 pain scale, described as throbbing, no alleviating factors identified.  Does report he has been sick recently with associated cough and congestion.  He has been taking Mucinex as well as Tylenol with temporary improvement of symptoms.  Denies any recent swimming or airplane travel.  He does use Q-tips as well as the end of his eyeglasses to scratch his ear intermittently.  He has also been putting tissue in the ear because he finds that air worsens the pain.  Denies any recent antibiotic use.    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   Aggressive behavior 08/13/2021   Acute alcohol intoxication (HCC) 07/17/2021   Schizophrenia, 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HCC) 03/05/2021   Arthralgia of left temporomandibular joint 10/08/2020   Referred otalgia of left ear 10/08/2020   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   Unspecified intellectual disabilities 01/27/2020   Weight gain due to medication 11/07/2019   Anticoagulated  05/13/2019   Migraine headache 04/18/2019   Generalized weakness    Health care home, active care coordination 10/06/2017   Multiple sclerosis (HCC) 05/20/2013   Abnormal findings on diagnostic imaging of skull and head, not elsewhere classified 11/23/2012   HTN (hypertension) 07/04/2011   Ataxia 07/02/2011   Obesity, Class III, BMI 40-49.9 (morbid obesity) (HCC) 01/14/2010   Attention deficit hyperactivity disorder (ADHD) 01/14/2010    Past Surgical History:  Procedure Laterality Date   NO PAST SURGERIES     None     RADIOLOGY WITH ANESTHESIA N/A 01/16/2021   Procedure: MRI WITH ANESTHESIA CERVICAL AND THORASIC SPINE WITH AND WITHOUT CONTRAST;  Surgeon: Radiologist, Medication, MD;  Location: [facility];  Service: Radiology;  Laterality: N/A;   TOOTH EXTRACTION N/A 06/24/2019   Procedure: DENTAL RESTORATION/EXTRACTION OF TEETH NUMBER ONE, SIXTEEN, SEVENTEEN, NINETEEN, THIRTY-TWO;  Surgeon: Ocie Doyne, DDS;  Location: [facility] OR;  Service: Oral Surgery;  Laterality: N/A;       Home Medications    Prior to Admission medications   Medication Sig Start Date End Date Taking? Authorizing Provider  ofloxacin (FLOXIN) 0.3 % OTIC solution Place 5 drops into the right ear 2 (two) times daily. 03/04/23  Yes Cornell Gaber, Noberto Retort, PA-C  acetaminophen (TYLENOL) 500 MG tablet Take 1,000 mg by mouth 3 (three) times daily as needed for moderate pain.    [provider]  amantadine (SYMMETREL) 100 MG  capsule Take 100 mg by mouth 2 (two) times daily. 09/25/21   [provider]  amLODipine (NORVASC) 5 MG tablet Take 5 mg by mouth in the morning. 03/17/22   [provider]  apixaban (ELIQUIS) 5 MG TABS tablet Take 1 tablet (5 mg total) by mouth 2 (two) times daily. 11/01/21   Clapacs, Jackquline Denmark, MD  benzonatate (TESSALON) 100 MG capsule Take 1 capsule (100 mg total) by mouth 3 (three) times daily as needed for cough. Patient not taking: Reported on 03/02/2023 12/01/22   Zenia Resides, MD   desvenlafaxine (PRISTIQ) 25 MG 24 hr tablet Take 25 mg by mouth daily. Patient not taking: Reported on 03/02/2023 12/02/22   [provider]  divalproex (DEPAKOTE) 250 MG DR tablet Take 3 tablets (750 mg total) by mouth at bedtime. 09/09/22 10/09/22  Earney Navy, NP  divalproex (DEPAKOTE) 500 MG DR tablet Take 1 tablet (500 mg total) by mouth daily at 12 noon. 09/09/22 10/09/22  Earney Navy, NP  docusate sodium (COLACE) 100 MG capsule Take 1 capsule (100 mg total) by mouth 2 (two) times daily. Patient not taking: Reported on 03/02/2023 11/01/21   Clapacs, Jackquline Denmark, MD  DULCOLAX 5 MG EC tablet Take 15 mg by mouth at bedtime.    [provider]  hydrOXYzine (ATARAX) 25 MG tablet Take 25 mg by mouth 3 (three) times daily.    [provider]  loratadine (CLARITIN) 10 MG tablet Take 1 tablet (10 mg total) by mouth daily as needed for allergies. Patient not taking: Reported on 03/02/2023 11/01/21   Clapacs, Jackquline Denmark, MD  losartan (COZAAR) 50 MG tablet Take 1 tablet (50 mg total) by mouth 2 (two) times daily. 11/01/21   Clapacs, Jackquline Denmark, MD  LUMIGAN 0.01 % SOLN Place 1 drop into both eyes at bedtime. Patient not taking: Reported on 03/02/2023 11/01/21   Clapacs, Jackquline Denmark, MD  meloxicam (MOBIC) 15 MG tablet TAKE 1 TABLET (15 MG TOTAL) BY MOUTH DAILY. Patient not taking: Reported on 03/02/2023 02/16/23   Louann Sjogren, DPM  metFORMIN (GLUCOPHAGE-XR) 750 MG 24 hr tablet Take by mouth. 11/27/22   [provider]  metoprolol tartrate (LOPRESSOR) 100 MG tablet Take 1 tablet (100 mg total) by mouth once for 1 dose. Take 90-120 minutes prior to scan. Hold for SBP less than 110. 03/02/23 03/02/23  Nahser, Deloris Ping, MD  Multiple Vitamins-Minerals (MULTIVITAMIN WITH MINERALS) tablet Take 1 tablet by mouth daily.    [provider]  NICORETTE 2 MG gum Take 2 mg by mouth as needed for smoking cessation.    [provider]  ocrelizumab (OCREVUS) 300 MG/10ML injection Inject  2 vials ( 20 MLs) into the vein every 6 months Patient not taking: Reported on 03/02/2023 07/31/22   Sater, Pearletha Furl, MD  OLANZapine (ZYPREXA) 15 MG tablet Take 15 mg by mouth at bedtime.    [provider]  paliperidone (INVEGA SUSTENNA) 156 MG/ML SUSY injection Inject 1 mL (156 mg total) into the muscle once for 1 dose. 09/12/22 09/12/22  Earney Navy, NP  QUVIVIQ 50 MG TABS Take 1 tablet by mouth daily. 01/02/23   [provider]  temazepam (RESTORIL) 7.5 MG capsule Take 1 capsule (7.5 mg total) by mouth at bedtime as needed for sleep. Patient not taking: Reported on 03/02/2023 09/09/22   Earney Navy, NP  topiramate (TOPAMAX) 25 MG tablet TAKE ONE TABLET BY MOUTH TWICE A DAY Patient not taking: Reported on 03/02/2023  01/23/22   Glean Salvo, NP  traZODone (DESYREL) 100 MG tablet Take 100 mg by mouth at bedtime. Patient not taking: Reported on 03/02/2023 11/29/22   [provider]  gabapentin (NEURONTIN) 400 MG capsule Take 400 mg by mouth 3 (three) times daily. 12/26/20 01/14/21  [provider]  propranolol (INDERAL) 10 MG tablet Take 1 tablet (10 mg total) by mouth 2 (two) times daily. 05/03/15 01/03/20  Charm Rings, NP    Family History Family History  Problem Relation Age of Onset   Diabetes Mother    ADD / ADHD Brother     Social History Social History   Tobacco Use   Smoking status: Former    Current packs/day: 0.25    Types: Cigarettes   Smokeless tobacco: Never   Tobacco comments:    2 cigarettes a day  Vaping Use   Vaping status: Never Used  Substance Use Topics   Alcohol use: Not Currently    Alcohol/week: 0.0 standard drinks of alcohol    Comment: "A little bit"    Drug use: Not Currently    Types: Marijuana    Comment: Last used: unknown      Allergies   Divalproex sodium   Review of Systems Review of Systems  Constitutional:  Negative for activity change, appetite change, fatigue and fever.  HENT:  Positive  for congestion, ear pain and postnasal drip. Negative for ear discharge, sneezing and sore throat.   Respiratory:  Positive for cough. Negative for shortness of breath.   Cardiovascular:  Negative for chest pain.  Gastrointestinal:  Negative for abdominal pain, diarrhea, nausea and vomiting.     Physical Exam Triage Vital Signs ED Triage Vitals [03/04/23 1111]  Encounter Vitals Group     BP (!) 128/91     Systolic BP Percentile      Diastolic BP Percentile      Pulse Rate 91     Resp 18     Temp 98.5 F (36.9 C)     Temp Source Oral     SpO2 98 %     Weight      Height      Head Circumference      Peak Flow      Pain Score 9     Pain Loc      Pain Education      Exclude from Growth Chart    No data found.  Updated Vital Signs BP (!) 128/91 (BP Location: Left Arm)   Pulse 91   Temp 98.5 F (36.9 C) (Oral)   Resp 18   SpO2 98%   Visual Acuity Right Eye Distance:   Left Eye Distance:   Bilateral Distance:    Right Eye Near:   Left Eye Near:    Bilateral Near:     Physical Exam Vitals reviewed.  Constitutional:      General: He is awake.     Appearance: Normal appearance. He is well-developed. He is not ill-appearing.     Comments: Very pleasant male appears stated age in no acute distress sitting comfortably in exam room  HENT:     Head: Normocephalic and atraumatic.     Right Ear: Tympanic membrane and external ear normal. Swelling and tenderness present. A foreign body is present. Tympanic membrane is not erythematous or bulging.     Left Ear: Tympanic membrane, ear canal and external ear normal. Tympanic membrane is not erythematous or bulging.     Ears:  Comments: Right ear: Tissue noted right external ear canal.  This was successfully removed revealing erythematous and swollen external ear canal with normal-appearing TM.  No active drainage noted.  TM is intact.    Nose: Nose normal.     Mouth/Throat:     Pharynx: Uvula midline. No oropharyngeal  exudate or posterior oropharyngeal erythema.  Cardiovascular:     Rate and Rhythm: Normal rate and regular rhythm.     Heart sounds: Normal heart sounds, S1 normal and S2 normal. No murmur heard. Pulmonary:     Effort: Pulmonary effort is normal. No accessory muscle usage or respiratory distress.     Breath sounds: Normal breath sounds. No stridor. No wheezing, rhonchi or rales.     Comments: Clear to auscultation bilaterally Neurological:     Mental Status: He is alert.  Psychiatric:        Behavior: Behavior is cooperative.      UC Treatments / Results  Labs (all labs ordered are listed, but only abnormal results are displayed) Labs Reviewed - No data to display  EKG   Radiology No results found.  Procedures Procedures (including critical care time)  Medications Ordered in UC Medications - No data to display  Initial Impression / Assessment and Plan / UC Course  I have reviewed the triage vital signs and the nursing notes.  Pertinent labs & imaging results that were available during my care of the patient were reviewed by me and considered in my medical decision making (see chart for details).     Patient is well-appearing, afebrile, nontoxic, nontachycardic.  Patient was noted in external ear canal that was successfully removed without complication using alligator forceps in its entirety.  Ear canal was noted to be erythematous and edematous but TM was normal and appearance.  Will treat for otitis externa with ofloxacin drops.  He was discouraged from using anything in the ear or submerging his head in water until symptoms resolve.  Recommend close follow-up with primary care.  Discussed that if he has any worsening or changing symptoms including increasing pain, fever, nausea, vomiting, otorrhea he needs to be seen immediately.  Strict return precautions given.  Final Clinical Impressions(s) / UC Diagnoses   Final diagnoses:  Infective otitis externa of right ear   Otalgia of right ear  Acute foreign body of right ear canal, initial encounter     Discharge Instructions      We were able to remove the tissue from his ear canal.  I am concerned that there is an infection of his ear canal.  Use ofloxacin drops twice daily for the next 7 days.  Do not put anything in the ear including earbuds, Q-tips, earplugs until symptoms resolve.  Do not submerge your head in water until symptoms resolve.  Follow-up with primary care if symptoms have not improved.  If anything worsens and you have drainage from the ear, fever, nausea, vomiting, increasing pain you need to be seen immediately.     ED Prescriptions     Medication Sig Dispense Auth. Provider   ofloxacin (FLOXIN) 0.3 % OTIC solution Place 5 drops into the right ear 2 (two) times daily. 5 mL Jordanna Hendrie K, PA-C      PDMP not reviewed this encounter.   Jeani Hawking, PA-C 03/04/23 1138

## 2023-03-23 ENCOUNTER — Encounter (HOSPITAL_COMMUNITY): Payer: Self-pay

## 2023-03-24 ENCOUNTER — Telehealth (HOSPITAL_COMMUNITY): Payer: Self-pay | Admitting: *Deleted

## 2023-03-24 NOTE — Telephone Encounter (Signed)
Reaching out to patient to offer assistance regarding upcoming cardiac imaging study; spoke to mother (ok per DPR)- mother verbalizes understanding of appt date/time, parking situation and where to check in, pre-test NPO status and medications ordered, and verified current allergies; name and call back number provided for further questions should they arise Johney Frame RN Navigator Cardiac Imaging [facility] [phone] office [phone] cell

## 2023-03-25 ENCOUNTER — Ambulatory Visit (HOSPITAL_COMMUNITY)
Admission: RE | Admit: 2023-03-25 | Discharge: 2023-03-25 | Disposition: A | Discharge status: Home / Self Care | Payer: 59 | Source: Ambulatory Visit | Attending: Cardiovascular Disease | Admitting: Cardiovascular Disease

## 2023-03-25 DIAGNOSIS — R072 Precordial pain: Secondary | ICD-10-CM | POA: Diagnosis present

## 2023-03-25 MED ORDER — NITROGLYCERIN 0.4 MG SL SUBL
0.8000 mg | SUBLINGUAL_TABLET | Freq: Once | SUBLINGUAL | Status: AC
  Administered 2023-03-25: 0.8 mg via SUBLINGUAL

## 2023-03-25 MED ORDER — NITROGLYCERIN 0.4 MG SL SUBL
SUBLINGUAL_TABLET | SUBLINGUAL | Status: AC
  Filled 2023-03-25: qty 2

## 2023-03-25 MED ORDER — IOHEXOL 350 MG/ML SOLN
95.0000 mL | Freq: Once | INTRAVENOUS | Status: AC | PRN
  Administered 2023-03-25: 95 mL via INTRAVENOUS

## 2023-03-25 MED ORDER — NITROGLYCERIN 0.4 MG SL SUBL
0.4000 mg | SUBLINGUAL_TABLET | Freq: Once | SUBLINGUAL | Status: AC
  Administered 2023-03-25: 0.4 mg via SUBLINGUAL

## 2023-03-25 MED ORDER — NITROGLYCERIN 0.4 MG SL SUBL
SUBLINGUAL_TABLET | SUBLINGUAL | Status: AC
  Filled 2023-03-25: qty 1

## 2023-03-31 ENCOUNTER — Other Ambulatory Visit: Payer: Self-pay | Admitting: Neurology

## 2023-03-31 ENCOUNTER — Other Ambulatory Visit: Payer: Self-pay

## 2023-03-31 NOTE — Progress Notes (Signed)
   Specialty Pharmacy Refill Coordination Note  Brandon [name] is a 32 y.o. male contacted today regarding refills of specialty medication(s) Ocrelizumab .  Patient requested Courier to Provider Office  on 04/08/23  to verified address [facility]  [address]   Pending Refill Request, Medication will be filled on 04/07/23.

## 2023-04-01 ENCOUNTER — Other Ambulatory Visit: Payer: Self-pay

## 2023-04-01 MED ORDER — OCREVUS 300 MG/10ML IV SOLN
INTRAVENOUS | 0 refills | Status: DC
  Filled 2023-04-01: qty 20, 180d supply, fill #0

## 2023-04-01 NOTE — Telephone Encounter (Signed)
Last seen on 07/23/12 No follow up scheduled

## 2023-04-07 ENCOUNTER — Other Ambulatory Visit (HOSPITAL_COMMUNITY): Payer: Self-pay

## 2023-04-07 ENCOUNTER — Other Ambulatory Visit: Payer: Self-pay

## 2023-04-07 ENCOUNTER — Telehealth: Payer: Self-pay

## 2023-04-07 NOTE — Telephone Encounter (Signed)
Pharmacy Patient Advocate Encounter   Received notification from Patient Pharmacy that prior authorization for Ocrevus 300MG /10ML solution is required/requested.   Insurance verification completed.   The patient is insured through [facility] .   Per test claim: PA required; PA submitted to [facility] via CoverMyMeds Key/confirmation #/EOC [id number] Status is pending

## 2023-04-08 ENCOUNTER — Other Ambulatory Visit: Payer: Self-pay

## 2023-04-08 NOTE — Telephone Encounter (Signed)
Pharmacy Patient Advocate Encounter  Received notification from [facility] that Prior Authorization for Ocrevus 300MG /10ML solution has been APPROVED from 04/07/2023 to 07/06/2024   PA #/Case ID/Reference #: PA Case ID #: [id number]

## 2023-04-15 ENCOUNTER — Ambulatory Visit: Payer: 59 | Admitting: Podiatry

## 2023-04-20 ENCOUNTER — Telehealth: Payer: Self-pay | Admitting: *Deleted

## 2023-04-20 ENCOUNTER — Other Ambulatory Visit (HOSPITAL_COMMUNITY): Payer: Self-pay

## 2023-04-20 ENCOUNTER — Other Ambulatory Visit: Payer: Self-pay | Admitting: *Deleted

## 2023-04-20 DIAGNOSIS — G35 Multiple sclerosis: Secondary | ICD-10-CM

## 2023-04-20 NOTE — Telephone Encounter (Signed)
Received notice from infusion suite that pt coming this Thurs. Upon review, new PA needing to be completed.  Called [employer] at  [phone]. Spoke w/ Harrold Donath. They only handle pharmacy part D PA's. He transferred me to [facility] (phone: [phone]) to complete medical buy/bill PA. Spoke w/ Raynelle Jan. Pt ID: [id number]   Pt has been on Ocrevus since 2019. Tried/failed: Ina Kick. Jcode for Ocrevus: [id number]. Dx: G35.  Dose: 300mg  IV q 8 months (on lower dose d/t lower antibody levels)  [facility]) Address: [address] # [place] [place], [place], [place] [address] Phone: [phone] Fax: [phone] NPI: [id number] Tax ID: [id number]  PA approved 04/20/23-04/19/24. PA# [id number]

## 2023-04-21 ENCOUNTER — Other Ambulatory Visit: Payer: Self-pay | Admitting: *Deleted

## 2023-04-21 DIAGNOSIS — G35 Multiple sclerosis: Secondary | ICD-10-CM

## 2023-04-21 NOTE — Telephone Encounter (Signed)
Called [employer] back. Per Dr. Epimenio Foot, dose should be 600mg  IV q 8 months. Spoke w/ Ronnell Freshwater. Need to submit new auth for 600mg  q 8 months.  I submitted over the phone and PA approved. New auth# [id number]. Effective 04/21/2023-04/20/2024.

## 2023-04-23 ENCOUNTER — Non-Acute Institutional Stay (HOSPITAL_COMMUNITY)
Admission: RE | Admit: 2023-04-23 | Discharge: 2023-04-23 | Disposition: A | Discharge status: Home / Self Care | Payer: 59 | Source: Ambulatory Visit | Attending: Internal Medicine | Admitting: Internal Medicine

## 2023-04-23 DIAGNOSIS — G35 Multiple sclerosis: Secondary | ICD-10-CM | POA: Insufficient documentation

## 2023-04-23 MED ORDER — FAMOTIDINE IN NACL 20-0.9 MG/50ML-% IV SOLN
20.0000 mg | Freq: Once | INTRAVENOUS | Status: AC
  Administered 2023-04-23: 20 mg via INTRAVENOUS
  Filled 2023-04-23: qty 50

## 2023-04-23 MED ORDER — SODIUM CHLORIDE 0.9 % IV SOLN
600.0000 mg | Freq: Once | INTRAVENOUS | Status: AC
  Administered 2023-04-23: 600 mg via INTRAVENOUS
  Filled 2023-04-23: qty 20

## 2023-04-23 MED ORDER — METHYLPREDNISOLONE SODIUM SUCC 125 MG IJ SOLR
125.0000 mg | Freq: Once | INTRAMUSCULAR | Status: AC
  Administered 2023-04-23: 125 mg via INTRAVENOUS
  Filled 2023-04-23: qty 2

## 2023-04-23 MED ORDER — DIPHENHYDRAMINE HCL 50 MG/ML IJ SOLN
50.0000 mg | Freq: Once | INTRAMUSCULAR | Status: AC
  Administered 2023-04-23: 50 mg via INTRAVENOUS
  Filled 2023-04-23: qty 1

## 2023-04-23 MED ORDER — ACETAMINOPHEN 325 MG PO TABS
650.0000 mg | ORAL_TABLET | Freq: Once | ORAL | Status: AC
  Administered 2023-04-23: 650 mg via ORAL
  Filled 2023-04-23: qty 2

## 2023-04-23 MED ORDER — SODIUM CHLORIDE 0.9 % IV SOLN: INTRAVENOUS | Status: DC | PRN

## 2023-04-23 NOTE — Progress Notes (Signed)
PATIENT CARE CENTER NOTE     Diagnosis:  Multiple sclerosis (HCC) (G35)     Provider: Despina Arias, MD     Procedure: Ocrevus 600 mg IV      Note: Patient received Ocrevus 600 mg infusion via PIV.  Pre-medications (Tylenol, IV Benadryl, IV Solumedrol and Pepcid IVPB) given per order and infusion titrated per protocol. Patient tolerated infusion well.  Mother at bedside throughout infusion. Vital signs stable. AVS offered but patient refused. Patient to come back in 8 months for next infusion and will schedule next appointment at the front desk. Patient alert, oriented and ambulatory at discharge. Discharged home with mother.

## 2023-07-07 ENCOUNTER — Other Ambulatory Visit: Payer: Self-pay

## 2023-07-07 ENCOUNTER — Emergency Department (HOSPITAL_COMMUNITY)
Admission: EM | Admit: 2023-07-07 | Discharge: 2023-07-08 | Disposition: A | Discharge status: Home / Self Care | Payer: 59 | Attending: Emergency Medicine | Admitting: Emergency Medicine

## 2023-07-07 ENCOUNTER — Encounter (HOSPITAL_COMMUNITY): Payer: Self-pay

## 2023-07-07 DIAGNOSIS — Y900 Blood alcohol level of less than 20 mg/100 ml: Secondary | ICD-10-CM | POA: Diagnosis not present

## 2023-07-07 DIAGNOSIS — Z7901 Long term (current) use of anticoagulants: Secondary | ICD-10-CM | POA: Diagnosis not present

## 2023-07-07 DIAGNOSIS — F79 Unspecified intellectual disabilities: Secondary | ICD-10-CM | POA: Diagnosis not present

## 2023-07-07 DIAGNOSIS — R030 Elevated blood-pressure reading, without diagnosis of hypertension: Secondary | ICD-10-CM

## 2023-07-07 DIAGNOSIS — Z8673 Personal history of transient ischemic attack (TIA), and cerebral infarction without residual deficits: Secondary | ICD-10-CM | POA: Insufficient documentation

## 2023-07-07 DIAGNOSIS — F209 Schizophrenia, unspecified: Secondary | ICD-10-CM | POA: Diagnosis present

## 2023-07-07 DIAGNOSIS — G35 Multiple sclerosis: Secondary | ICD-10-CM | POA: Insufficient documentation

## 2023-07-07 DIAGNOSIS — F201 Disorganized schizophrenia: Secondary | ICD-10-CM | POA: Diagnosis not present

## 2023-07-07 DIAGNOSIS — F259 Schizoaffective disorder, unspecified: Secondary | ICD-10-CM | POA: Insufficient documentation

## 2023-07-07 DIAGNOSIS — I10 Essential (primary) hypertension: Secondary | ICD-10-CM | POA: Diagnosis not present

## 2023-07-07 DIAGNOSIS — F309 Manic episode, unspecified: Secondary | ICD-10-CM | POA: Diagnosis not present

## 2023-07-07 DIAGNOSIS — Z79899 Other long term (current) drug therapy: Secondary | ICD-10-CM | POA: Diagnosis not present

## 2023-07-07 DIAGNOSIS — R440 Auditory hallucinations: Secondary | ICD-10-CM | POA: Insufficient documentation

## 2023-07-07 LAB — CBC WITH DIFFERENTIAL/PLATELET
Abs Immature Granulocytes: 0.03 10*3/uL (ref 0.00–0.07)
Basophils Absolute: 0 10*3/uL (ref 0.0–0.1)
Basophils Relative: 1 %
Eosinophils Absolute: 0.2 10*3/uL (ref 0.0–0.5)
Eosinophils Relative: 3 %
HCT: 42.5 % (ref 39.0–52.0)
Hemoglobin: 13.7 g/dL (ref 13.0–17.0)
Immature Granulocytes: 0 %
Lymphocytes Relative: 38 %
Lymphs Abs: 2.7 10*3/uL (ref 0.7–4.0)
MCH: 26.6 pg (ref 26.0–34.0)
MCHC: 32.2 g/dL (ref 30.0–36.0)
MCV: 82.4 fL (ref 80.0–100.0)
Monocytes Absolute: 0.5 10*3/uL (ref 0.1–1.0)
Monocytes Relative: 7 %
Neutro Abs: 3.7 10*3/uL (ref 1.7–7.7)
Neutrophils Relative %: 51 %
Platelets: 193 10*3/uL (ref 150–400)
RBC: 5.16 MIL/uL (ref 4.22–5.81)
RDW: 14.9 % (ref 11.5–15.5)
WBC: 7.2 10*3/uL (ref 4.0–10.5)
nRBC: 0 % (ref 0.0–0.2)

## 2023-07-07 LAB — COMPREHENSIVE METABOLIC PANEL
ALT: 29 U/L (ref 0–44)
AST: 15 U/L (ref 15–41)
Albumin: 4.1 g/dL (ref 3.5–5.0)
Alkaline Phosphatase: 77 U/L (ref 38–126)
Anion gap: 8 (ref 5–15)
BUN: 13 mg/dL (ref 6–20)
CO2: 23 mmol/L (ref 22–32)
Calcium: 8.8 mg/dL — ABNORMAL LOW (ref 8.9–10.3)
Chloride: 104 mmol/L (ref 98–111)
Creatinine, Ser: 0.96 mg/dL (ref 0.61–1.24)
GFR, Estimated: >60 mL/min (ref >60)
Glucose, Bld: 116 mg/dL — ABNORMAL HIGH (ref 70–99)
Potassium: 3.6 mmol/L (ref 3.5–5.1)
Sodium: 135 mmol/L (ref 135–145)
Total Bilirubin: 0.4 mg/dL (ref 0.0–1.2)
Total Protein: 7.1 g/dL (ref 6.5–8.1)

## 2023-07-07 LAB — RAPID URINE DRUG SCREEN, HOSP PERFORMED
Amphetamines: NOT DETECTED
Barbiturates: NOT DETECTED
Benzodiazepines: NOT DETECTED
Cocaine: NOT DETECTED
Opiates: NOT DETECTED
Tetrahydrocannabinol: NOT DETECTED

## 2023-07-07 LAB — ACETAMINOPHEN LEVEL: Acetaminophen (Tylenol), Serum: <10 ug/mL — ABNORMAL LOW (ref 10–30)

## 2023-07-07 LAB — VALPROIC ACID LEVEL: Valproic Acid Lvl: 18 ug/mL — ABNORMAL LOW (ref 50.0–100.0)

## 2023-07-07 LAB — ETHANOL: Alcohol, Ethyl (B): <10 mg/dL (ref <10)

## 2023-07-07 LAB — SALICYLATE LEVEL: Salicylate Lvl: <7 mg/dL — ABNORMAL LOW (ref 7.0–30.0)

## 2023-07-07 MED ORDER — DIVALPROEX SODIUM 500 MG PO DR TAB
750.0000 mg | DELAYED_RELEASE_TABLET | Freq: Every day | ORAL | Status: DC
  Administered 2023-07-07: 750 mg via ORAL
  Filled 2023-07-07: qty 1

## 2023-07-07 MED ORDER — OLANZAPINE 5 MG PO TABS
15.0000 mg | ORAL_TABLET | Freq: Every day | ORAL | Status: DC
  Administered 2023-07-07: 15 mg via ORAL
  Filled 2023-07-07: qty 1

## 2023-07-07 MED ORDER — APIXABAN 5 MG PO TABS
5.0000 mg | ORAL_TABLET | Freq: Two times a day (BID) | ORAL | Status: DC
  Administered 2023-07-07 – 2023-07-08 (×2): 5 mg via ORAL
  Filled 2023-07-07 (×3): qty 1

## 2023-07-07 MED ORDER — ZIPRASIDONE MESYLATE 20 MG IM SOLR
10.0000 mg | Freq: Once | INTRAMUSCULAR | Status: DC
Start: 2023-07-07 — End: 2023-07-07

## 2023-07-07 MED ORDER — DIVALPROEX SODIUM 500 MG PO DR TAB
500.0000 mg | DELAYED_RELEASE_TABLET | Freq: Every day | ORAL | Status: DC
  Administered 2023-07-08: 500 mg via ORAL
  Filled 2023-07-07: qty 1

## 2023-07-07 MED ORDER — LOSARTAN POTASSIUM 25 MG PO TABS
50.0000 mg | ORAL_TABLET | Freq: Two times a day (BID) | ORAL | Status: DC
  Administered 2023-07-07 – 2023-07-08 (×2): 50 mg via ORAL
  Filled 2023-07-07: qty 1
  Filled 2023-07-07: qty 2
  Filled 2023-07-07: qty 1

## 2023-07-07 MED ORDER — AMLODIPINE BESYLATE 5 MG PO TABS: 5.0000 mg | ORAL_TABLET | Freq: Every morning | ORAL | Status: DC

## 2023-07-07 MED ORDER — AMLODIPINE BESYLATE 5 MG PO TABS
5.0000 mg | ORAL_TABLET | Freq: Every day | ORAL | Status: DC
  Administered 2023-07-08: 5 mg via ORAL
  Filled 2023-07-07: qty 1

## 2023-07-07 MED ORDER — LORAZEPAM 2 MG/ML IJ SOLN
1.0000 mg | Freq: Once | INTRAMUSCULAR | Status: DC
Start: 2023-07-07 — End: 2023-07-07

## 2023-07-07 NOTE — ED Triage Notes (Signed)
 Pt BIB police with reports of hallucinations today. Pt reports taking his medications as prescribed.

## 2023-07-07 NOTE — ED Provider Triage Note (Signed)
 Emergency Medicine Provider Triage Evaluation Note  Brandon [name] , a 32 y.o. male  was evaluated in triage.  Pt complains of hallucinations.  He was brought in by the police.  He reports taking medications as prescribed.   Review of Systems  Positive: As above Negative: As above  Physical Exam  BP (!) 166/121 (BP Location: Right Arm)   Pulse 85   Temp 98.5 F (36.9 C) (Oral)   Resp 18   Ht 6' 1 (1.854 m)   Wt (!) 147.4 kg   SpO2 95%   BMI 42.87 kg/m  Gen:   Awake, no distress   Resp:  Normal effort  MSK:   Moves extremities without difficulty  Other:  Patient is cooperative.  He is talking to people/things that are not there.  Medical Decision Making  Medically screening exam initiated at 7:00 PM.  Appropriate orders placed.  DOMONICK SITTNER was informed that the remainder of the evaluation will be completed by another provider, this initial triage assessment does not replace that evaluation, and the importance of remaining in the ED until their evaluation is complete.     Gretta Gerard SAUNDERS, [place] 07/07/23 1902
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
## 2023-07-07 NOTE — ED Notes (Signed)
 Patient belongings placed in locker 30 and he was wanded by security.   Also, provided patient with a snack and drink.

## 2023-07-07 NOTE — ED Provider Notes (Addendum)
 [place] EMERGENCY DEPARTMENT AT [facility] Provider Note   CSN: [id number] Arrival date & time: 07/07/23  1714     History  Chief Complaint  Patient presents with   Hallucinations    Brandon [name] is a 32 y.o. male.  Pt with hx schizoaffective disorder, presents w ivc indicating he has been hearing voices. Pt indicates not sure what the voices are saying. Notes hx same. Pt relatively poor historian - level 5 caveat. Denies physical c/o. No fever or chills. No headaches. No cough or uri symptoms. No fever or chills. No abd pain or nvd. No gu c/o. No chest pain. Pt denies change in meds and indicates has been compliant w meds.   The history is provided by the patient and medical records. The history is limited by the condition of the patient.       Home Medications Prior to Admission medications   Medication Sig Start Date End Date Taking? Authorizing Provider  acetaminophen  (TYLENOL ) 500 MG tablet Take 1,000 mg by mouth 3 (three) times daily as needed for moderate pain.    [provider]  amantadine  (SYMMETREL ) 100 MG capsule Take 100 mg by mouth 2 (two) times daily. 09/25/21   [provider]  amLODipine  (NORVASC ) 5 MG tablet Take 5 mg by mouth in the morning. 03/17/22   [provider]  apixaban  (ELIQUIS ) 5 MG TABS tablet Take 1 tablet (5 mg total) by mouth 2 (two) times daily. 11/01/21   Clapacs, Norleen DASEN, MD  benzonatate  (TESSALON ) 100 MG capsule Take 1 capsule (100 mg total) by mouth 3 (three) times daily as needed for cough. Patient not taking: Reported on 03/02/2023 12/01/22   Vonna Sharlet POUR, MD  desvenlafaxine (PRISTIQ) 25 MG 24 hr tablet Take 25 mg by mouth daily. Patient not taking: Reported on 03/02/2023 12/02/22   [provider]  divalproex  (DEPAKOTE ) 250 MG DR tablet Take 3 tablets (750 mg total) by mouth at bedtime. 09/09/22 10/09/22  Onuoha, Josephine C, NP  divalproex  (DEPAKOTE ) 500 MG DR tablet Take 1 tablet (500 mg  total) by mouth daily at 12 noon. 09/09/22 10/09/22  Onuoha, Josephine C, NP  docusate sodium  (COLACE) 100 MG capsule Take 1 capsule (100 mg total) by mouth 2 (two) times daily. Patient not taking: Reported on 03/02/2023 11/01/21   Clapacs, Norleen DASEN, MD  DULCOLAX 5 MG EC tablet Take 15 mg by mouth at bedtime.    [provider]  hydrOXYzine  (ATARAX ) 25 MG tablet Take 25 mg by mouth 3 (three) times daily.    [provider]  loratadine  (CLARITIN ) 10 MG tablet Take 1 tablet (10 mg total) by mouth daily as needed for allergies. Patient not taking: Reported on 03/02/2023 11/01/21   Clapacs, Norleen DASEN, MD  losartan  (COZAAR ) 50 MG tablet Take 1 tablet (50 mg total) by mouth 2 (two) times daily. 11/01/21   Clapacs, Norleen DASEN, MD  LUMIGAN  0.01 % SOLN Place 1 drop into both eyes at bedtime. Patient not taking: Reported on 03/02/2023 11/01/21   Clapacs, Norleen DASEN, MD  meloxicam  (MOBIC ) 15 MG tablet TAKE 1 TABLET (15 MG TOTAL) BY MOUTH DAILY. Patient not taking: Reported on 03/02/2023 02/16/23   Sikora, Rebecca, DPM  metFORMIN  (GLUCOPHAGE -XR) 750 MG 24 hr tablet Take by mouth. 11/27/22   [provider]  metoprolol  tartrate (LOPRESSOR ) 100 MG tablet Take 1 tablet (100 mg total) by mouth once for 1 dose. Take 90-120 minutes prior to scan. Hold for SBP  less than 110. 03/02/23 03/02/23  Nahser, Aleene PARAS, MD  Multiple Vitamins-Minerals (MULTIVITAMIN WITH MINERALS) tablet Take 1 tablet by mouth daily.    [provider]  NICORETTE  2 MG gum Take 2 mg by mouth as needed for smoking cessation.    [provider]  ocrelizumab  (OCREVUS ) 300 MG/10ML injection Inject 2 vials ( 20 MLs) into the vein every 6 months 04/01/23   Sater, Charlie LABOR, MD  ofloxacin  (FLOXIN ) 0.3 % OTIC solution Place 5 drops into the right ear 2 (two) times daily. 03/04/23   Raspet, Rocky POUR, PA-C  OLANZapine  (ZYPREXA ) 15 MG tablet Take 15 mg by mouth at bedtime.    [provider]  paliperidone  (INVEGA  SUSTENNA) 156 MG/ML  SUSY injection Inject 1 mL (156 mg total) into the muscle once for 1 dose. 09/12/22 09/12/22  Onuoha, Josephine C, NP  QUVIVIQ 50 MG TABS Take 1 tablet by mouth daily. 01/02/23   [provider]  temazepam  (RESTORIL ) 7.5 MG capsule Take 1 capsule (7.5 mg total) by mouth at bedtime as needed for sleep. Patient not taking: Reported on 03/02/2023 09/09/22   Onuoha, Josephine C, NP  topiramate  (TOPAMAX ) 25 MG tablet TAKE ONE TABLET BY MOUTH TWICE A DAY Patient not taking: Reported on 03/02/2023 01/23/22   Gayland Lauraine PARAS, NP  traZODone  (DESYREL ) 100 MG tablet Take 100 mg by mouth at bedtime. Patient not taking: Reported on 03/02/2023 11/29/22   [provider]  gabapentin  (NEURONTIN ) 400 MG capsule Take 400 mg by mouth 3 (three) times daily. 12/26/20 01/14/21  [provider]  propranolol  (INDERAL ) 10 MG tablet Take 1 tablet (10 mg total) by mouth 2 (two) times daily. 05/03/15 01/03/20  Jacquetta Sharlot GRADE, NP      Allergies    Divalproex  sodium    Review of Systems   Review of Systems  Constitutional:  Negative for fever.  HENT:  Negative for sore throat.   Eyes:  Negative for visual disturbance.  Respiratory:  Negative for shortness of breath.   Cardiovascular:  Negative for chest pain.  Gastrointestinal:  Negative for abdominal pain.  Genitourinary:  Negative for flank pain.  Neurological:  Negative for headaches.  Psychiatric/Behavioral:  Positive for hallucinations. Negative for self-injury and suicidal ideas.     Physical Exam Updated Vital Signs BP (!) 166/121 (BP Location: Right Arm)   Pulse 85   Temp 98.5 F (36.9 C) (Oral)   Resp 18   Ht 1.854 m (6' 1)   Wt (!) 147.4 kg   SpO2 95%   BMI 42.87 kg/m  Physical Exam Vitals and nursing note reviewed.  Constitutional:      Appearance: Normal appearance. He is well-developed.  HENT:     Head: Atraumatic.     Nose: Nose normal.     Mouth/Throat:     Mouth: Mucous membranes are moist.  Eyes:     General: No  scleral icterus.    Conjunctiva/sclera: Conjunctivae normal.     Pupils: Pupils are equal, round, and reactive to light.  Neck:     Trachea: No tracheal deviation.     Comments: No stiffness or rigidity. Trachea midline, thyroid  not grossly enlarged or tender. Cardiovascular:     Rate and Rhythm: Normal rate and regular rhythm.     Pulses: Normal pulses.     Heart sounds: Normal heart sounds. No murmur heard.    No friction rub. No gallop.  Pulmonary:     Effort: Pulmonary effort is normal. No  accessory muscle usage or respiratory distress.     Breath sounds: Normal breath sounds.  Abdominal:     General: There is no distension.     Palpations: Abdomen is soft.     Tenderness: There is no abdominal tenderness.  Musculoskeletal:        General: No swelling.     Cervical back: Normal range of motion and neck supple. No rigidity.  Skin:    General: Skin is warm and dry.     Findings: No rash.  Neurological:     Mental Status: He is alert.     Comments: Alert, speech clear. Motor/sens grossly intact. Steady gait.   Psychiatric:        Mood and Affect: Mood normal.     Comments: Normal mood and affect. Does not appear to be actively responding to internal stimuli. Denies thoughts of harm to self or others. No acute psychosis noted.      ED Results / Procedures / Treatments   Labs (all labs ordered are listed, but only abnormal results are displayed) Results for orders placed or performed during the hospital encounter of 07/07/23  CBC with Differential   Collection Time: 07/07/23  6:00 PM  Result Value Ref Range   WBC 7.2 4.0 - 10.5 K/uL   RBC 5.16 4.22 - 5.81 MIL/uL   Hemoglobin 13.7 13.0 - 17.0 g/dL   HCT 57.4 60.9 - 47.9 %   MCV 82.4 80.0 - 100.0 fL   MCH 26.6 26.0 - 34.0 pg   MCHC 32.2 30.0 - 36.0 g/dL   RDW 85.0 88.4 - 84.4 %   Platelets 193 150 - 400 K/uL   nRBC 0.0 0.0 - 0.2 %   Neutrophils Relative % 51 %   Neutro Abs 3.7 1.7 - 7.7 K/uL   Lymphocytes Relative 38  %   Lymphs Abs 2.7 0.7 - 4.0 K/uL   Monocytes Relative 7 %   Monocytes Absolute 0.5 0.1 - 1.0 K/uL   Eosinophils Relative 3 %   Eosinophils Absolute 0.2 0.0 - 0.5 K/uL   Basophils Relative 1 %   Basophils Absolute 0.0 0.0 - 0.1 K/uL   Immature Granulocytes 0 %   Abs Immature Granulocytes 0.03 0.00 - 0.07 K/uL  Comprehensive metabolic panel   Collection Time: 07/07/23  6:00 PM  Result Value Ref Range   Sodium 135 135 - 145 mmol/L   Potassium 3.6 3.5 - 5.1 mmol/L   Chloride 104 98 - 111 mmol/L   CO2 23 22 - 32 mmol/L   Glucose, Bld 116 (H) 70 - 99 mg/dL   BUN 13 6 - 20 mg/dL   Creatinine, Ser 9.03 0.61 - 1.24 mg/dL   Calcium  8.8 (L) 8.9 - 10.3 mg/dL   Total Protein 7.1 6.5 - 8.1 g/dL   Albumin 4.1 3.5 - 5.0 g/dL   AST 15 15 - 41 U/L   ALT 29 0 - 44 U/L   Alkaline Phosphatase 77 38 - 126 U/L   Total Bilirubin 0.4 0.0 - 1.2 mg/dL   GFR, Estimated >39 >39 mL/min   Anion gap 8 5 - 15  Acetaminophen  level   Collection Time: 07/07/23  6:00 PM  Result Value Ref Range   Acetaminophen  (Tylenol ), Serum <10 (L) 10 - 30 ug/mL  Salicylate level   Collection Time: 07/07/23  6:00 PM  Result Value Ref Range   Salicylate Lvl <7.0 (L) 7.0 - 30.0 mg/dL  Ethanol   Collection Time: 07/07/23  6:00 PM  Result Value Ref Range   Alcohol, Ethyl (B) <10 <10 mg/dL  Valproic  acid level   Collection Time: 07/07/23  6:00 PM  Result Value Ref Range   Valproic  Acid Lvl 18 (L) 50.0 - 100.0 ug/mL  Rapid urine drug screen (hospital performed)   Collection Time: 07/07/23  6:09 PM  Result Value Ref Range   Opiates NONE DETECTED NONE DETECTED   Cocaine NONE DETECTED NONE DETECTED   Benzodiazepines NONE DETECTED NONE DETECTED   Amphetamines NONE DETECTED NONE DETECTED   Tetrahydrocannabinol NONE DETECTED NONE DETECTED   Barbiturates NONE DETECTED NONE DETECTED     EKG None  Radiology No results found.  Procedures Procedures    Medications Ordered in ED Medications - No data to display  ED  Course/ Medical Decision Making/ A&P                                 Medical Decision Making Problems Addressed: Auditory hallucinations: acute illness or injury with systemic symptoms that poses a threat to life or bodily functions Elevated blood pressure reading: acute illness or injury Essential hypertension: chronic illness or injury with exacerbation, progression, or side effects of treatment that poses a threat to life or bodily functions Schizoaffective disorder, unspecified type (HCC): chronic illness or injury with exacerbation, progression, or side effects of treatment that poses a threat to life or bodily functions  Amount and/or Complexity of Data Reviewed Independent Historian:     Details: [facility], hx External Data Reviewed: notes. Labs: ordered. Decision-making details documented in ED Course. Discussion of management or test interpretation with external provider(s): [facility].  Risk Prescription drug management. Decision regarding hospitalization.   Labs ordered/sent.   Differential diagnosis includes psychosis, chronic mental health illness, etc. Dispo decision including potential need for admission considered - will get labs and reassess.   Reviewed nursing notes and prior charts for additional history. External reports reviewed. Additional history from: [facility].  Labs reviewed/interpreted by me - wbc and hgb normal. Chem normal.   BH team consulted.   The patient has been placed in psychiatric observation due to the need to provide a safe environment for the patient while obtaining psychiatric consultation and evaluation, as well as ongoing medical and medication management to treat the patient's condition.    Dispo per [facility] team.   Med rec remains pending.   Recheck, pt participating in [facility] assessment.   Dispo per [facility] team.         Final Clinical Impression(s) / ED Diagnoses Final diagnoses:  None    Rx / DC Orders ED Discharge Orders     None           Bernard Drivers, MD 07/07/23 2251

## 2023-07-08 ENCOUNTER — Ambulatory Visit (HOSPITAL_COMMUNITY)
Admission: EM | Admit: 2023-07-08 | Discharge: 2023-07-09 | Disposition: A | Discharge status: Home / Self Care | Payer: 59 | Source: Home / Self Care

## 2023-07-08 DIAGNOSIS — F209 Schizophrenia, unspecified: Secondary | ICD-10-CM

## 2023-07-08 DIAGNOSIS — F201 Disorganized schizophrenia: Secondary | ICD-10-CM | POA: Diagnosis not present

## 2023-07-08 DIAGNOSIS — F309 Manic episode, unspecified: Secondary | ICD-10-CM | POA: Insufficient documentation

## 2023-07-08 DIAGNOSIS — F259 Schizoaffective disorder, unspecified: Secondary | ICD-10-CM | POA: Insufficient documentation

## 2023-07-08 MED ORDER — ACETAMINOPHEN 325 MG PO TABS: 650.0000 mg | ORAL_TABLET | Freq: Four times a day (QID) | ORAL | Status: DC | PRN

## 2023-07-08 MED ORDER — APIXABAN 5 MG PO TABS
5.0000 mg | ORAL_TABLET | Freq: Two times a day (BID) | ORAL | Status: DC
  Administered 2023-07-08 – 2023-07-09 (×2): 5 mg via ORAL
  Filled 2023-07-08 (×2): qty 1

## 2023-07-08 MED ORDER — ALUM & MAG HYDROXIDE-SIMETH 200-200-20 MG/5ML PO SUSP: 30.0000 mL | ORAL | Status: DC | PRN

## 2023-07-08 MED ORDER — AMLODIPINE BESYLATE 5 MG PO TABS
5.0000 mg | ORAL_TABLET | Freq: Every day | ORAL | Status: DC
  Administered 2023-07-09: 5 mg via ORAL
  Filled 2023-07-08: qty 1

## 2023-07-08 MED ORDER — LORAZEPAM 1 MG PO TABS: 1.0000 mg | ORAL_TABLET | ORAL | Status: DC | PRN

## 2023-07-08 MED ORDER — DIPHENHYDRAMINE HCL 25 MG PO CAPS
25.0000 mg | ORAL_CAPSULE | Freq: Once | ORAL | Status: AC
  Administered 2023-07-08: 25 mg via ORAL
  Filled 2023-07-08: qty 1

## 2023-07-08 MED ORDER — MAGNESIUM HYDROXIDE 400 MG/5ML PO SUSP: 30.0000 mL | Freq: Every day | ORAL | Status: DC | PRN

## 2023-07-08 MED ORDER — LOSARTAN POTASSIUM 50 MG PO TABS
50.0000 mg | ORAL_TABLET | Freq: Two times a day (BID) | ORAL | Status: DC
  Administered 2023-07-08 – 2023-07-09 (×2): 50 mg via ORAL
  Filled 2023-07-08 (×2): qty 1

## 2023-07-08 MED ORDER — DIVALPROEX SODIUM 500 MG PO DR TAB
500.0000 mg | DELAYED_RELEASE_TABLET | Freq: Every day | ORAL | Status: DC
  Administered 2023-07-09: 500 mg via ORAL
  Filled 2023-07-08: qty 1

## 2023-07-08 MED ORDER — ZIPRASIDONE MESYLATE 20 MG IM SOLR: 20.0000 mg | INTRAMUSCULAR | Status: DC | PRN

## 2023-07-08 MED ORDER — HYDROXYZINE HCL 25 MG PO TABS: 25.0000 mg | ORAL_TABLET | Freq: Three times a day (TID) | ORAL | Status: DC | PRN

## 2023-07-08 MED ORDER — OLANZAPINE 7.5 MG PO TABS
15.0000 mg | ORAL_TABLET | Freq: Every day | ORAL | Status: DC
  Administered 2023-07-08: 15 mg via ORAL
  Filled 2023-07-08: qty 2

## 2023-07-08 MED ORDER — RISPERIDONE 2 MG PO TBDP: 2.0000 mg | ORAL_TABLET | Freq: Three times a day (TID) | ORAL | Status: DC | PRN

## 2023-07-08 MED ORDER — DIVALPROEX SODIUM 500 MG PO DR TAB
750.0000 mg | DELAYED_RELEASE_TABLET | Freq: Every day | ORAL | Status: DC
  Administered 2023-07-08: 750 mg via ORAL
  Filled 2023-07-08: qty 1

## 2023-07-08 NOTE — ED Notes (Signed)
 Pt is on unit and he is laughing loudly and responding to internal stimuli pt is alert but is hearing voices will continue to monitor for safety

## 2023-07-08 NOTE — ED Notes (Signed)
 Patient admitted to observation unit. Patient ambulatory and stable, food and drink offered, patient oriented to unit. Patient currently denies SI/HI and AVH. Patient currently eating meal in lounger in no acute distress. Patient able to contract for safety. Environment secured, safety checks in place per facility policy.

## 2023-07-08 NOTE — ED Notes (Signed)
 Pt transported to [facility]. Pt belonging bag removed from TCU locker 30 and given to security. Security escorted pt to safe transport vehicle.

## 2023-07-08 NOTE — ED Notes (Signed)
 The patient is sitting in the recliner. No acute distress noted. Environment is secured. Will continue to monitor for safety.
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
## 2023-07-08 NOTE — ED Notes (Signed)
 Patient alert, cooperative. Takes medications whole without difficulty. Ambulatory and able to make needs known.

## 2023-07-08 NOTE — Consult Note (Signed)
 [facility] Psychiatric Consult Initial  Patient Name: .Brandon [name]  MRN: [id number]  DOB: 02/11/1991  Consult Order details:  Orders (From admission, onward)     Start     Ordered   07/07/23 1914  CONSULT TO CALL ACT TEAM       Ordering Provider: Bernard Drivers, MD  Provider:  (Not yet assigned)  Question:  Reason for Consult?  Answer:  Psych consult   07/07/23 1913             Mode of Visit: In person    Psychiatry Consult Evaluation  Service Date: July 08, 2023 LOS:  LOS: 0 days  Chief Complaint Voices talk to me through the radio   Primary Psychiatric Diagnoses  Schizophrenia 2.  Unspecified Intellectual Disabilities   Assessment  Brandon [name] is a 33 y.o. male admitted: Presented to the ED for 07/07/2023  5:16 PM for brought in by police under IVC for hallucinations. He carries the psychiatric diagnoses of schizophrenia and unspecified intellectual disabilities and has a past medical history of  HTN, multiple sclerosis and generalized weakness.   His current presentation of auditory hallucinations is most consistent with schizophrenia. He meets criteria for schizophrenia based on a history of more than 6 months of auditory hallucinations.  Current outpatient psychotropic medications include depakote  and olanzapine  and historically he has had a positive response to these medications. He was compliant with medications prior to admission as evidenced by patient report. On initial examination, patient is resting quietly in his bed in his room. Please see plan below for detailed recommendations.   Diagnoses:  Active Hospital problems: Principal Problem:   Schizophrenia, 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HCC)    Plan   ## Psychiatric Medication Recommendations:  Continue zyprexa  15mg  PO Q HS Continue depakote  500mg  PO Q day and 750mg  PO Q HS  ## Medical Decision Making Capacity: Patient has a guardian and has thus been adjudicated incompetent; please involve patients guardian  in medical decision making  ## Further Work-up:  -- most recent EKG on 07/07/2023 had QtC of 412 -- Pertinent labwork reviewed earlier this admission includes: CBC, CMP, UTOX, valproic  acid level, alcohol   ## Disposition:-- Ms Ellinwood is willing to pick patient up tomorrow if he does well overnight.  Plan Post Discharge/Psychiatric Care Follow-up resources follow up with current outpatient psychiatric provider  ## Behavioral / Environmental: -Utilize compassion and acknowledge the patient's experiences while setting clear and realistic expectations for care.    ## Safety and Observation Level:  - Based on my clinical evaluation, I estimate the patient to be at no risk of self harm in the current setting. - At this time, we recommend  routine. This decision is based on my review of the chart including patient's history and current presentation, interview of the patient, mental status examination, and consideration of suicide risk including evaluating suicidal ideation, plan, intent, suicidal or self-harm behaviors, risk factors, and protective factors. This judgment is based on our ability to directly address suicide risk, implement suicide prevention strategies, and develop a safety plan while the patient is in the clinical setting. Please contact our team if there is a concern that risk level has changed.  CSSR Risk Category:C-SSRS RISK CATEGORY: No Risk  Suicide Risk Assessment: Patient has following modifiable risk factors for suicide: None, which we are addressing by recommending patient continue to follow up with outpatient provider. Patient has following non-modifiable or demographic risk factors for suicide: male gender and psychiatric hospitalization Patient has the  following protective factors against suicide: Access to outpatient mental health care and Supportive family  Thank you for this consult request. Recommendations have been communicated to the primary team.  We will continue  to follow him at this time.   [name] DELENA Patient, NP       History of Present Illness  Relevant Aspects of Hospital ED Course:  Admitted on 07/07/2023 brought in by police under IVC for hallucinations. He carries the psychiatric diagnoses of schizophrenia and unspecified intellectual disabilities and has a past medical history of  HTN, multiple sclerosis and generalized weakness.    Patient Report:   Brandon [name], 33 y.o., male patient seen face to face by this provider, consulted with Dr. Larina; and chart reviewed on 07/08/23.  On evaluation Brandon [name] reports he feels good.  He says he slept well and is eating well.  He states he hears voices talk to him through the radio, but that they are better today and they are not saying mean things.   During evaluation Brandon [name] is resting in bed in no acute distress.  He is alert, oriented x 4, calm, cooperative and attentive.  His mood is euthymic with congruent affect.  He has normal speech, and behavior.  Objectively there is evidence of mania or delusional thinking.  Patient is able to converse coherently, goal directed thoughts, no distractibility, or pre-occupation.  He denies suicidal/self-harm/homicidal ideation and paranoia.  Patient endorses psychosis in the form of auditory hallucinations.  Patient answered question appropriately.    Psych ROS:  Depression: denies Anxiety:  denies Mania (lifetime and current): denies Psychosis: (lifetime and current): endorses previously and currently  Collateral information:  Contacted Brandon [name] at [phone] on 07/08/2023. Discussed medication changes with Ms Scales.  She understands the changes we have made.  She said she will be willing to pick patient up tomorrow if he continues to do well overnight.    Review of Systems  Psychiatric/Behavioral:  Positive for hallucinations.   All other systems reviewed and are negative.    Psychiatric and Social History  Psychiatric History:   Information collected from patient and chart review  Prev Dx/Sx: schizophrenia and unspecified intellectual disabilities Current Psych Provider: Dr Sable Home Meds (current): Invega  sustenna, zyprexa , hydroxyzine   Previous Med Trials: divalproex  Therapy: No  Prior Psych Hospitalization: yes  Prior Self Harm: none noted Prior Violence: none noted  Family Psych History: none noted Family Hx suicide: none noted  Social History:  Developmental Hx: intellectual disability Educational Hx: Patient had difficulty in school Occupational Hx: on disability Legal Hx: none Living Situation: lives with Mom in group home Spiritual Hx: none Access to weapons/lethal means: No   Substance History Alcohol: None  Tobacco: None Illicit drugs: None Prescription drug abuse: None Rehab hx: None  Exam Findings  Physical Exam:  Vital Signs:  Temp:  [97.4 F (36.3 C)-98.5 F (36.9 C)] 97.4 F (36.3 C) (01/01 0948) Pulse Rate:  [74-97] 74 (01/01 0948) Resp:  [16-18] 18 (01/01 0948) BP: (149-166)/(94-121) 149/94 (01/01 0948) SpO2:  [95 %-100 %] 96 % (01/01 0948) Weight:  [147.4 kg] 147.4 kg (12/31 1743) Blood pressure (!) 149/94, pulse 74, temperature (!) 97.4 F (36.3 C), temperature source Oral, resp. rate 18, height 6' 1 (1.854 m), weight (!) 147.4 kg, SpO2 96%. Body mass index is 42.87 kg/m.  Physical Exam Vitals and nursing note reviewed.  Constitutional:      Appearance: He is obese.  Eyes:  Pupils: Pupils are equal, round, and reactive to light.  Pulmonary:     Effort: Pulmonary effort is normal.  Skin:    General: Skin is dry.  Neurological:     Mental Status: He is alert and oriented to person, place, and time.  Psychiatric:        Attention and Perception: Attention normal. He perceives auditory hallucinations.        Mood and Affect: Mood normal.        Speech: Speech normal.        Behavior: Behavior is cooperative.        Thought Content: Thought content  normal.        Cognition and Memory: Cognition is impaired.     Mental Status Exam: General Appearance: Casual  Orientation:  Full (Time, Place, and Person)  Memory:  Immediate;   Good Recent;   Good Remote;   Good  Concentration:  Concentration: Good  Recall:  Good  Attention  Good  Eye Contact:  Good  Speech:  Clear and Coherent  Language:  Fair  Volume:  Normal  Mood: euthymic  Affect:  Congruent  Thought Process:  Coherent  Thought Content:  Hallucinations: Auditory  Suicidal Thoughts:  No  Homicidal Thoughts:  No  Judgement:  Impaired  Insight:  Shallow  Psychomotor Activity:  Normal  Akathisia:  No  Fund of Knowledge:  Fair      Assets:  Manufacturing Systems Engineer Housing Leisure Time Social Support  Cognition:  Impaired,  Mild  ADL's:  Intact  AIMS (if indicated):        Other History   These have been pulled in through the EMR, reviewed, and updated if appropriate.  Family History:  The patient's family history includes ADD / ADHD in his brother; Diabetes in his mother.  Medical History: Past Medical History:  Diagnosis Date   ADHD (attention deficit hyperactivity disorder)    Bipolar 1 disorder (HCC)    Chronic back pain    Chronic constipation    Chronic neck pain    Hypertension    Multiple sclerosis (HCC) 05/20/2013   left sided weakness, dysarthria   Non-compliance    Obesity    Pulmonary embolism (HCC)    Schizophrenia (HCC)    Stroke (HCC)    left sided deficits - pt's mother denies this    Surgical History: Past Surgical History:  Procedure Laterality Date   NO PAST SURGERIES     None     RADIOLOGY WITH ANESTHESIA N/A 01/16/2021   Procedure: MRI WITH ANESTHESIA CERVICAL AND THORASIC SPINE WITH AND WITHOUT CONTRAST;  Surgeon: Radiologist, Medication, MD;  Location: [facility] OR;  Service: Radiology;  Laterality: N/A;   TOOTH EXTRACTION N/A 06/24/2019   Procedure: DENTAL RESTORATION/EXTRACTION OF TEETH NUMBER ONE, SIXTEEN, SEVENTEEN, NINETEEN,  THIRTY-TWO;  Surgeon: Sheryle Hamilton, DDS;  Location: [facility] OR;  Service: Oral Surgery;  Laterality: N/A;     Medications:   Current Facility-Administered Medications:    amLODipine  (NORVASC ) tablet 5 mg, 5 mg, Oral, Daily, Steinl, Kevin, MD, 5 mg at 07/08/23 9066   apixaban  (ELIQUIS ) tablet 5 mg, 5 mg, Oral, BID, Steinl, Kevin, MD, 5 mg at 07/08/23 9065   divalproex  (DEPAKOTE ) DR tablet 500 mg, 500 mg, Oral, Q1200, Steinl, Kevin, MD, 500 mg at 07/08/23 1152   divalproex  (DEPAKOTE ) DR tablet 750 mg, 750 mg, Oral, QHS, Steinl, Kevin, MD, 750 mg at 07/07/23 2107   losartan  (COZAAR ) tablet 50 mg, 50 mg, Oral, BID, Steinl, Kevin,  MD, 50 mg at 07/08/23 9066   OLANZapine  (ZYPREXA ) tablet 15 mg, 15 mg, Oral, QHS, Steinl, Kevin, MD, 15 mg at 07/07/23 2106  Current Outpatient Medications:    acetaminophen  (TYLENOL ) 500 MG tablet, Take 1,000 mg by mouth 3 (three) times daily as needed for moderate pain., Disp: , Rfl:    amantadine  (SYMMETREL ) 100 MG capsule, Take 100 mg by mouth 2 (two) times daily., Disp: , Rfl:    amLODipine  (NORVASC ) 5 MG tablet, Take 5 mg by mouth in the morning., Disp: , Rfl:    apixaban  (ELIQUIS ) 5 MG TABS tablet, Take 1 tablet (5 mg total) by mouth 2 (two) times daily., Disp: 60 tablet, Rfl: 0   benzonatate  (TESSALON ) 100 MG capsule, Take 1 capsule (100 mg total) by mouth 3 (three) times daily as needed for cough. (Patient not taking: Reported on 03/02/2023), Disp: 21 capsule, Rfl: 0   desvenlafaxine (PRISTIQ) 25 MG 24 hr tablet, Take 25 mg by mouth daily. (Patient not taking: Reported on 03/02/2023), Disp: , Rfl:    divalproex  (DEPAKOTE ) 250 MG DR tablet, Take 3 tablets (750 mg total) by mouth at bedtime., Disp: 90 tablet, Rfl: 0   divalproex  (DEPAKOTE ) 500 MG DR tablet, Take 1 tablet (500 mg total) by mouth daily at 12 noon., Disp: 30 tablet, Rfl: 0   docusate sodium  (COLACE) 100 MG capsule, Take 1 capsule (100 mg total) by mouth 2 (two) times daily. (Patient not taking: Reported on  03/02/2023), Disp: 60 capsule, Rfl: 0   DULCOLAX 5 MG EC tablet, Take 15 mg by mouth at bedtime., Disp: , Rfl:    hydrOXYzine  (ATARAX ) 25 MG tablet, Take 25 mg by mouth 3 (three) times daily., Disp: , Rfl:    loratadine  (CLARITIN ) 10 MG tablet, Take 1 tablet (10 mg total) by mouth daily as needed for allergies. (Patient not taking: Reported on 03/02/2023), Disp: 30 tablet, Rfl: 0   losartan  (COZAAR ) 50 MG tablet, Take 1 tablet (50 mg total) by mouth 2 (two) times daily., Disp: 60 tablet, Rfl: 0   LUMIGAN  0.01 % SOLN, Place 1 drop into both eyes at bedtime. (Patient not taking: Reported on 03/02/2023), Disp: 2.5 mL, Rfl: 0   meloxicam  (MOBIC ) 15 MG tablet, TAKE 1 TABLET (15 MG TOTAL) BY MOUTH DAILY. (Patient not taking: Reported on 03/02/2023), Disp: 30 tablet, Rfl: 0   metFORMIN  (GLUCOPHAGE -XR) 750 MG 24 hr tablet, Take by mouth., Disp: , Rfl:    metoprolol  tartrate (LOPRESSOR ) 100 MG tablet, Take 1 tablet (100 mg total) by mouth once for 1 dose. Take 90-120 minutes prior to scan. Hold for SBP less than 110., Disp: 1 tablet, Rfl: 0   Multiple Vitamins-Minerals (MULTIVITAMIN WITH MINERALS) tablet, Take 1 tablet by mouth daily., Disp: , Rfl:    NICORETTE  2 MG gum, Take 2 mg by mouth as needed for smoking cessation., Disp: , Rfl:    ocrelizumab  (OCREVUS ) 300 MG/10ML injection, Inject 2 vials ( 20 MLs) into the vein every 6 months, Disp: 20 mL, Rfl: 0   ofloxacin  (FLOXIN ) 0.3 % OTIC solution, Place 5 drops into the right ear 2 (two) times daily., Disp: 5 mL, Rfl: 0   OLANZapine  (ZYPREXA ) 15 MG tablet, Take 15 mg by mouth at bedtime., Disp: , Rfl:    paliperidone  (INVEGA  SUSTENNA) 156 MG/ML SUSY injection, Inject 1 mL (156 mg total) into the muscle once for 1 dose., Disp: 1 mL, Rfl: 0   QUVIVIQ 50 MG TABS, Take 1 tablet by mouth daily., Disp: ,  Rfl:    temazepam  (RESTORIL ) 7.5 MG capsule, Take 1 capsule (7.5 mg total) by mouth at bedtime as needed for sleep. (Patient not taking: Reported on 03/02/2023), Disp:  30 capsule, Rfl: 0   topiramate  (TOPAMAX ) 25 MG tablet, TAKE ONE TABLET BY MOUTH TWICE A DAY (Patient not taking: Reported on 03/02/2023), Disp: 60 tablet, Rfl: 4   traZODone  (DESYREL ) 100 MG tablet, Take 100 mg by mouth at bedtime. (Patient not taking: Reported on 03/02/2023), Disp: , Rfl:   Allergies: Allergies  Allergen Reactions   Divalproex  Sodium Other (See Comments)    Causes excessive salivation/unintentional drooling    [name] DELENA Patient, NP

## 2023-07-08 NOTE — ED Provider Notes (Signed)
 Emergency Medicine Observation Re-evaluation Note  Brandon [name] is a 33 y.o. male, seen on rounds today.  Pt initially presented to the ED for complaints of Hallucinations Currently, the patient is calm awaiting plan from psych.  Physical Exam  BP (!) 149/94 (BP Location: Left Arm)   Pulse 74   Temp (!) 97.4 F (36.3 C) (Oral)   Resp 18   Ht 1.854 m (6' 1)   Wt (!) 147.4 kg   SpO2 96%   BMI 42.87 kg/m  Physical Exam General: Well-developed well-nourished Cardiac: Mildly hypertensive regular heart rate Lungs: No respiratory distress sats 96% Psych: Calm and resting at this time  ED Course / MDM  EKG:EKG Interpretation Date/Time:  Tuesday July 07 2023 20:10:43 EST Ventricular Rate:  82 PR Interval:  124 QRS Duration:  98 QT Interval:  352 QTC Calculation: 412 R Axis:   13  Text Interpretation: Sinus rhythm Confirmed by Trine Likes [phone]) on 07/07/2023 10:55:49 PM  I have reviewed the labs performed to date as well as medications administered while in observation.  Recent changes in the last 24 hours include patient evaluated with sent by psychiatry.  Plan  Current plan is for to evaluate and plan disposition.    Levander Houston, MD 07/08/23 [phone]

## 2023-07-08 NOTE — ED Notes (Signed)
 0200 - Patient has been unable to fall asleep d/t hallucinations. Notified Dr. Eudelia Bunch. New order for benadryl placed. Will give and will continue to monitor.

## 2023-07-08 NOTE — ED Notes (Signed)
 Patient resting quietly in bed with eyes closed. Respirations equal and unlabored, skin warm and dry, NAD. Routine safety checks conducted according to facility protocol. Will continue to monitor for safety.

## 2023-07-09 DIAGNOSIS — F209 Schizophrenia, unspecified: Secondary | ICD-10-CM | POA: Diagnosis not present

## 2023-07-09 DIAGNOSIS — F259 Schizoaffective disorder, unspecified: Secondary | ICD-10-CM | POA: Diagnosis not present

## 2023-07-09 NOTE — Discharge Instructions (Addendum)
-  Follow-up with your outpatient psychiatric provider, Dr. Akintayo, at [facility] within one week.   -Take your psychiatric medications as prescribed at discharge - instructions are provided to you in the discharge paperwork  -Follow-up with outpatient primary care doctor and other specialists -for management of preventative medicine and chronic medical disease: Hypertension   -If you are prescribed an atypical antipsychotic medication, we recommend that your outpatient psychiatrist follow routine screening for side effects within 3 months of discharge, including monitoring: AIMS scale, height, weight, blood pressure, fasting lipid panel, HbA1c, and fasting blood sugar.   -Recommend total abstinence from alcohol, tobacco, and other illicit drug use at discharge.   -If your psychiatric symptoms recur, worsen, or if you have side effects to your psychiatric medications, call your outpatient psychiatric provider, [phone] or go to the nearest emergency department.  -If suicidal thoughts occur, immediately call your outpatient psychiatric provider, 911, [phone] or go to the nearest emergency department.

## 2023-07-09 NOTE — ED Notes (Signed)
 Pt sleeping@this  time breathing even and unlabored will continue to monitor for safety

## 2023-07-09 NOTE — ED Notes (Signed)
 Pt resting in bed at the current c eyes closed. No s/s of acute distress, pain or any discomfort at this time. VSS. Safety maintained. Will continue to monitor and report any COC.

## 2023-07-09 NOTE — ED Provider Notes (Signed)
 FBC/OBS ASAP Discharge Summary  Date and Time: 07/09/2023 12:23 PM  Name: Brandon [name]  MRN:  [id number]   Discharge Diagnoses:  Final diagnoses:  Schizophrenia, unspecified type [facility])    Subjective: Patient seen face-to-face by this provider. Chart reviewed.  Consulted with attending psychiatrist, V.Goli. On evaluation today, the patient denies having active suicidal thoughts prior to discharge. Patient denies having homicidal thoughts. Patient denies having auditory hallucinations. Patient denies any visual hallucinations or other symptoms of psychosis.    Stay Summary: Brandon [name] is 33 year old male patient with a past psychiatric history significant for schizophrenia, IDD, and ADHD who was transferred from the [facility] emergency department to the [facility] for overnight observation due to complaints of auditory hallucinations.    Patients mother, who is also legal guardian, was contacted on day of discharge 07/09/2023 and notified that patient was appropriate for discharge. LG agrees and has no acute safety concerns.    Gradually, patient started adjusting to milieu. The patient was evaluated each day by a clinical provider to ascertain response to treatment. Symptoms were reported as significantly decreased or resolved completely by discharge.    On day of discharge, the patient's mood appears stable. The patient was motivated to continue taking medication with a goal of continued improvement in mental health.    # It is recommended to the patient to continue psychiatric medications as prescribed, after discharge from the hospital.     # It is recommended to the patient to follow up with your outpatient psychiatric provider and PCP within one week of discharge.    # In the event of worsening symptoms, the patient's legal guardian is instructed to call the crisis hotline, 911 and or go to the nearest ED for appropriate evaluation and  treatment of symptoms. To follow-up with primary care provider for other medical issues, concerns and or health care needs   # Patient was discharged home in the care of his mother and grandmother with a plan to follow up as noted below.    Total Time spent with patient: 45 minutes  Past Psychiatric History:  Per chart review, history of Schizoaffective disorder, IDD, and ADHD   Past Medical History: Per chart review, history of hypertension, multiple sclerosis, IDD, generalized weakness, and migraine headaches.   Family History: Patient unable to provide history  Family Psychiatric History: Patient unable to provide history  Social History: Patient has a legal legal guardian, mother, Torri Langston [phone].   Tobacco Cessation:  N/A, patient does not currently use tobacco products  Current Medications:  Current Facility-Administered Medications  Medication Dose Route Frequency Provider Last Rate Last Admin   acetaminophen  (TYLENOL ) tablet 650 mg  650 mg Oral Q6H PRN White, Patrice L, NP       alum & mag hydroxide-simeth (MAALOX/MYLANTA) 200-200-20 MG/5ML suspension 30 mL  30 mL Oral Q4H PRN White, Patrice L, NP       amLODipine  (NORVASC ) tablet 5 mg  5 mg Oral Daily White, Patrice L, NP   5 mg at 07/09/23 1036   apixaban  (ELIQUIS ) tablet 5 mg  5 mg Oral BID White, Patrice L, NP   5 mg at 07/09/23 1035   divalproex  (DEPAKOTE ) DR tablet 500 mg  500 mg Oral Q1200 White, Patrice L, NP   500 mg at 07/09/23 1035   divalproex  (DEPAKOTE ) DR tablet 750 mg  750 mg Oral QHS White, Patrice L, NP   750 mg at 07/08/23 2111  hydrOXYzine  (ATARAX ) tablet 25 mg  25 mg Oral TID PRN White, Patrice L, NP       risperiDONE  (RISPERDAL  M-TABS) disintegrating tablet 2 mg  2 mg Oral Q8H PRN White, Patrice L, NP       And   LORazepam  (ATIVAN ) tablet 1 mg  1 mg Oral PRN White, Patrice L, NP       And   ziprasidone  (GEODON ) injection 20 mg  20 mg Intramuscular PRN White, Patrice L, NP       losartan   (COZAAR ) tablet 50 mg  50 mg Oral BID White, Patrice L, NP   50 mg at 07/09/23 1036   magnesium  hydroxide (MILK OF MAGNESIA) suspension 30 mL  30 mL Oral Daily PRN White, Patrice L, NP       OLANZapine  (ZYPREXA ) tablet 15 mg  15 mg Oral QHS White, Patrice L, NP   15 mg at 07/08/23 2111   Current Outpatient Medications  Medication Sig Dispense Refill   acetaminophen  (TYLENOL ) 500 MG tablet Take 1,000 mg by mouth 3 (three) times daily as needed for moderate pain.     amantadine  (SYMMETREL ) 100 MG capsule Take 100 mg by mouth 2 (two) times daily.     amLODipine  (NORVASC ) 5 MG tablet Take 5 mg by mouth in the morning.     apixaban  (ELIQUIS ) 5 MG TABS tablet Take 1 tablet (5 mg total) by mouth 2 (two) times daily. 60 tablet 0   diphenhydrAMINE  (BENADRYL ) 25 mg capsule Take 25 mg by mouth at bedtime. Along with Quviviq     divalproex  (DEPAKOTE ) 250 MG DR tablet Take 3 tablets (750 mg total) by mouth at bedtime. 90 tablet 0   divalproex  (DEPAKOTE ) 500 MG DR tablet Take 1 tablet (500 mg total) by mouth daily at 12 noon. (Patient not taking: Reported on 07/08/2023) 30 tablet 0   docusate sodium  (COLACE) 100 MG capsule Take 1 capsule (100 mg total) by mouth 2 (two) times daily. 60 capsule 0   DULCOLAX 5 MG EC tablet Take 15 mg by mouth at bedtime.     hydrOXYzine  (ATARAX ) 25 MG tablet Take 25 mg by mouth 3 (three) times daily.     loratadine  (CLARITIN ) 10 MG tablet Take 1 tablet (10 mg total) by mouth daily as needed for allergies. 30 tablet 0   losartan  (COZAAR ) 50 MG tablet Take 1 tablet (50 mg total) by mouth 2 (two) times daily. (Patient not taking: Reported on 07/08/2023) 60 tablet 0   LUMIGAN  0.01 % SOLN Place 1 drop into both eyes at bedtime. 2.5 mL 0   metFORMIN  (GLUCOPHAGE -XR) 750 MG 24 hr tablet Take 750 mg by mouth in the morning and at bedtime.     Multiple Vitamins-Minerals (MULTIVITAMIN WITH MINERALS) tablet Take 1 tablet by mouth daily.     NICORETTE  2 MG gum Take 2 mg by mouth as needed for  smoking cessation.     ocrelizumab  (OCREVUS ) 300 MG/10ML injection Inject 2 vials ( 20 MLs) into the vein every 6 months 20 mL 0   OLANZapine  (ZYPREXA ) 15 MG tablet Take 15 mg by mouth at bedtime.     paliperidone  (INVEGA  SUSTENNA) 156 MG/ML SUSY injection Inject 1 mL (156 mg total) into the muscle once for 1 dose. 1 mL 0   paliperidone  (INVEGA ) 9 MG 24 hr tablet Take 9 mg by mouth daily.     QUVIVIQ 50 MG TABS Take 1 tablet by mouth daily.     SODIUM FLUORIDE 5000  PPM 1.1 % CREA dental cream Place 1 Application onto teeth in the morning and at bedtime.      PTA Medications:  Facility Ordered Medications  Medication   [COMPLETED] diphenhydrAMINE  (BENADRYL ) capsule 25 mg   acetaminophen  (TYLENOL ) tablet 650 mg   alum & mag hydroxide-simeth (MAALOX/MYLANTA) 200-200-20 MG/5ML suspension 30 mL   magnesium  hydroxide (MILK OF MAGNESIA) suspension 30 mL   hydrOXYzine  (ATARAX ) tablet 25 mg   amLODipine  (NORVASC ) tablet 5 mg   apixaban  (ELIQUIS ) tablet 5 mg   divalproex  (DEPAKOTE ) DR tablet 500 mg   divalproex  (DEPAKOTE ) DR tablet 750 mg   losartan  (COZAAR ) tablet 50 mg   OLANZapine  (ZYPREXA ) tablet 15 mg   risperiDONE  (RISPERDAL  M-TABS) disintegrating tablet 2 mg   And   LORazepam  (ATIVAN ) tablet 1 mg   And   ziprasidone  (GEODON ) injection 20 mg   PTA Medications  Medication Sig   losartan  (COZAAR ) 50 MG tablet Take 1 tablet (50 mg total) by mouth 2 (two) times daily. (Patient not taking: Reported on 07/08/2023)   docusate sodium  (COLACE) 100 MG capsule Take 1 capsule (100 mg total) by mouth 2 (two) times daily.   apixaban  (ELIQUIS ) 5 MG TABS tablet Take 1 tablet (5 mg total) by mouth 2 (two) times daily.   loratadine  (CLARITIN ) 10 MG tablet Take 1 tablet (10 mg total) by mouth daily as needed for allergies.   LUMIGAN  0.01 % SOLN Place 1 drop into both eyes at bedtime.   amantadine  (SYMMETREL ) 100 MG capsule Take 100 mg by mouth 2 (two) times daily.   amLODipine  (NORVASC ) 5 MG tablet Take  5 mg by mouth in the morning.   hydrOXYzine  (ATARAX ) 25 MG tablet Take 25 mg by mouth 3 (three) times daily.   DULCOLAX 5 MG EC tablet Take 15 mg by mouth at bedtime.   NICORETTE  2 MG gum Take 2 mg by mouth as needed for smoking cessation.   Multiple Vitamins-Minerals (MULTIVITAMIN WITH MINERALS) tablet Take 1 tablet by mouth daily.   acetaminophen  (TYLENOL ) 500 MG tablet Take 1,000 mg by mouth 3 (three) times daily as needed for moderate pain.   paliperidone  (INVEGA  SUSTENNA) 156 MG/ML SUSY injection Inject 1 mL (156 mg total) into the muscle once for 1 dose.   divalproex  (DEPAKOTE ) 250 MG DR tablet Take 3 tablets (750 mg total) by mouth at bedtime.   QUVIVIQ 50 MG TABS Take 1 tablet by mouth daily.   metFORMIN  (GLUCOPHAGE -XR) 750 MG 24 hr tablet Take 750 mg by mouth in the morning and at bedtime.   OLANZapine  (ZYPREXA ) 15 MG tablet Take 15 mg by mouth at bedtime.   ocrelizumab  (OCREVUS ) 300 MG/10ML injection Inject 2 vials ( 20 MLs) into the vein every 6 months   paliperidone  (INVEGA ) 9 MG 24 hr tablet Take 9 mg by mouth daily.   SODIUM FLUORIDE 5000 PPM 1.1 % CREA dental cream Place 1 Application onto teeth in the morning and at bedtime.   diphenhydrAMINE  (BENADRYL ) 25 mg capsule Take 25 mg by mouth at bedtime. Along with Quviviq        No data to display          Flowsheet Row ED from 07/08/2023 in [facility] ED from 07/07/2023 in [facility] Emergency Department at [facility] ED from 03/04/2023 in [facility] at [facility] RISK CATEGORY No Risk No Risk No Risk       Musculoskeletal  Strength & Muscle Tone:  within normal limits Gait & Station: normal Patient leans: N/A  Psychiatric Specialty Exam  Presentation  General Appearance:  Disheveled  Eye Contact: Fair  Speech: Slow  Speech Volume: Decreased  Handedness: Right   Mood and Affect  Mood: Euthymic  Affect: Congruent   Thought Process   Thought Processes: Coherent  Descriptions of Associations:Intact  Orientation:Partial  Thought Content:Logical  Diagnosis of Schizophrenia or Schizoaffective disorder in past: Yes  Duration of Psychotic Symptoms: Greater than six months   Hallucinations:Hallucinations: Other (comment) (Denies)  Ideas of Reference:None  Suicidal Thoughts:Suicidal Thoughts: No  Homicidal Thoughts:Homicidal Thoughts: No   Sensorium  Memory: Immediate Fair  Judgment: Impaired  Insight: Other (comment) (Baseline)   Executive Functions  Concentration: Fair  Attention Span: Fair  Recall: [employer] of Knowledge: Fair  Language: Fair   Psychomotor Activity  Psychomotor Activity: Psychomotor Activity: Normal   Assets  Assets: Health And Safety Inspector; Housing; Social Support   Sleep  Sleep: Sleep: Fair   Nutritional Assessment (For OBS and FBC admissions only) Has the patient had a weight loss or gain of 10 pounds or more in the last 3 months?: No Has the patient had a decrease in food intake/or appetite?: No Does the patient have dental problems?: No Does the patient have eating habits or behaviors that may be indicators of an eating disorder including binging or inducing vomiting?: No Has the patient recently lost weight without trying?: 0 Has the patient been eating poorly because of a decreased appetite?: 0 Malnutrition Screening Tool Score: 0    Physical Exam  Physical Exam Vitals and nursing note reviewed.  Constitutional:      General: He is not in acute distress.    Appearance: He is obese.  HENT:     Head: Normocephalic.     Nose: Nose normal.  Pulmonary:     Effort: No respiratory distress.  Musculoskeletal:        General: Normal range of motion.     Cervical back: Normal range of motion.  Skin:    General: Skin is dry.  Neurological:     Mental Status: He is alert. Mental status is at baseline.    Review of Systems   Constitutional:  Negative for chills, fever and malaise/fatigue.  HENT:  Negative for hearing loss and nosebleeds.   Eyes:  Negative for discharge and redness.  Respiratory:  Negative for cough and shortness of breath.   Cardiovascular:  Negative for chest pain and palpitations.  Gastrointestinal:  Negative for constipation, diarrhea, nausea and vomiting.  Neurological:  Negative for tremors.  Psychiatric/Behavioral:  Negative for hallucinations, substance abuse and suicidal ideas.    Blood pressure (!) 138/92, pulse 85, temperature 98 F (36.7 C), temperature source Oral, resp. rate 18, SpO2 99%. There is no height or weight on file to calculate BMI.  Demographic Factors:  Male  Loss Factors: NA  Historical Factors: Impulsivity  Risk Reduction Factors:   Living with another person, especially a relative, Positive social support, and Positive therapeutic relationship  Continued Clinical Symptoms:  Previous Psychiatric Diagnoses and Treatments  Cognitive Features That Contribute To Risk:  Loss of executive function    Suicide Risk:  Minimal: No identifiable suicidal ideation.  Patients presenting with no risk factors but with morbid ruminations; may be classified as minimal risk based on the severity of the depressive symptoms  Plan Of Care/Follow-up recommendations:  -Follow-up with your outpatient psychiatric provider, Dr. Akintayo, at [facility] within one week.   -Take  your psychiatric medications as prescribed at discharge - instructions are provided to you in the discharge paperwork  -Follow-up with outpatient primary care doctor and other specialists -for management of preventative medicine and chronic medical disease: Hypertension   -If you are prescribed an atypical antipsychotic medication, we recommend that your outpatient psychiatrist follow routine screening for side effects within 3 months of discharge, including monitoring: AIMS scale, height,  weight, blood pressure, fasting lipid panel, HbA1c, and fasting blood sugar.   -Recommend total abstinence from alcohol, tobacco, and other illicit drug use at discharge.   -If your psychiatric symptoms recur, worsen, or if you have side effects to your psychiatric medications, call your outpatient psychiatric provider, 911, [phone] or go to the nearest emergency department.  -If suicidal thoughts occur, immediately call your outpatient psychiatric provider, 911, [phone] or go to the nearest emergency department.     Disposition: Discharge home to the care of his mother and grandmother. The patient appears reasonably screened and/or stabilized for discharge and does not appear to have emergency medical/psychiatric concerns/conditions requiring further screening, evaluation, or treatment at this time prior to discharge.     Blair Chiquita Hint, NP 07/09/2023, 12:23 PM

## 2023-07-09 NOTE — ED Notes (Signed)
Pt discharged with AVS to grandmother. AVS reviewed prior to discharge. Pt alert, oriented, and ambulatory. Safety maintained.

## 2023-07-09 NOTE — ED Notes (Signed)
 Pt resting in bed at the current c eyes closed. Resident denies HI, SI, & AVH. Denies pain or any discomfort at this time. No s/s of acute distress observed. VSS. Safety maintained. Will continue to monitor and report any COC.

## 2023-07-15 ENCOUNTER — Ambulatory Visit: Payer: 59 | Admitting: Podiatry

## 2023-07-29 ENCOUNTER — Ambulatory Visit (INDEPENDENT_AMBULATORY_CARE_PROVIDER_SITE_OTHER): Payer: 59 | Admitting: Podiatry

## 2023-07-29 DIAGNOSIS — Z91199 Patient's noncompliance with other medical treatment and regimen due to unspecified reason: Secondary | ICD-10-CM

## 2023-07-29 NOTE — Progress Notes (Signed)
No show

## 2023-08-30 ENCOUNTER — Ambulatory Visit (INDEPENDENT_AMBULATORY_CARE_PROVIDER_SITE_OTHER): Payer: 59

## 2023-08-30 ENCOUNTER — Encounter (HOSPITAL_COMMUNITY): Payer: Self-pay | Admitting: Emergency Medicine

## 2023-08-30 ENCOUNTER — Ambulatory Visit (HOSPITAL_COMMUNITY)
Admission: EM | Admit: 2023-08-30 | Discharge: 2023-08-30 | Disposition: A | Discharge status: Home / Self Care | Payer: 59 | Attending: Physician Assistant | Admitting: Physician Assistant

## 2023-08-30 ENCOUNTER — Other Ambulatory Visit: Payer: Self-pay

## 2023-08-30 DIAGNOSIS — H5711 Ocular pain, right eye: Secondary | ICD-10-CM

## 2023-08-30 DIAGNOSIS — S0511XA Contusion of eyeball and orbital tissues, right eye, initial encounter: Secondary | ICD-10-CM

## 2023-08-30 NOTE — ED Triage Notes (Signed)
 Pt c/o right ear fullness for a week getting hard to hear from it.

## 2023-08-30 NOTE — Discharge Instructions (Addendum)
 Your x-rays were negative for signs of a fracture or broken bones around your eye.  At this time I suspect he likely just have a lot of bruising and tenderness.  You can use Tylenol as needed for pain management.  I do not recommend taking ibuprofen as you are on an anticoagulant (Eliquis).  You can use a cool compress to the area to help with swelling.  Please do not apply ice directly to the skin as this can cause irritation and frostbite.  You can apply a compress for about 10 minutes but then you need to keep it off for at least 30. To help with eye irritation you can use lubricating eyedrops such as blink tears or refresh drops as desired.  The redness in your eye will likely start to resolve with time.  If you start to have vision changes, trouble moving your eyes, more intense headaches, increased swelling around the area, noticed blood in the iris or colored part of the eye, intense headaches please go to the emergency room for further evaluation and management.

## 2023-08-30 NOTE — ED Provider Notes (Signed)
 [facility]    CSN: [id number] Arrival date & time: 08/30/23  1306      History   Chief Complaint Chief Complaint  Patient presents with   Ear Fullness    HPI Brandon [name] is a 33 y.o. male.   HPI  He is here with his grandmother to assist with HPI, Sallye Ober   He is here for concerns for his right eye  He reports he hit is right eye on the wall at his group class on Thurs? They state it started to get worse on Friday  He reports it is tender to the touch but does not hurt otherwise  He denies matting of the eye or bleeding  He reports some pain with EOM   His grandmother states that the eye appears to be getting worse as time progresses  They have applied witch hazel to the area  He took Tylenol for pain yesterday    He reports mild headache since injury but denies LOC, nausea, vomiting, dizziness or neck pain   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   Aggressive behavior 08/13/2021   Acute alcohol intoxication (HCC) 07/17/2021   Schizophrenia, 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HCC) 03/05/2021   Arthralgia of left temporomandibular joint 10/08/2020   Referred otalgia of left ear 10/08/2020   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   Unspecified intellectual disabilities 01/27/2020   Weight gain due to medication 11/07/2019   Anticoagulated 05/13/2019   Migraine headache 04/18/2019   Generalized weakness    Health  care home, active care coordination 10/06/2017   Multiple sclerosis (HCC) 05/20/2013   Abnormal findings on diagnostic imaging of skull and head, not elsewhere classified 11/23/2012   HTN (hypertension) 07/04/2011   Ataxia 07/02/2011   Obesity, Class III, BMI 40-49.9 (morbid obesity) (HCC) 01/14/2010   Attention deficit hyperactivity disorder (ADHD) 01/14/2010    Past Surgical History:  Procedure Laterality Date   NO PAST SURGERIES     None     RADIOLOGY WITH ANESTHESIA N/A 01/16/2021   Procedure: MRI WITH ANESTHESIA CERVICAL AND THORASIC SPINE WITH AND WITHOUT CONTRAST;  Surgeon: Radiologist, Medication, MD;  Location: [facility] OR;  Service: Radiology;  Laterality: N/A;   TOOTH EXTRACTION N/A 06/24/2019   Procedure: DENTAL RESTORATION/EXTRACTION OF TEETH NUMBER ONE, SIXTEEN, SEVENTEEN, NINETEEN, THIRTY-TWO;  Surgeon: Ocie Doyne, DDS;  Location: [facility] OR;  Service: Oral Surgery;  Laterality: N/A;       Home Medications    Prior to Admission medications   Medication Sig Start Date End Date Taking? Authorizing Provider  acetaminophen (TYLENOL) 500 MG tablet Take 1,000 mg by mouth 3 (three) times daily as needed for moderate pain.    [provider]  amantadine (SYMMETREL) 100 MG capsule Take 100 mg by mouth 2 (two) times daily. 09/25/21   [provider]  amLODipine (NORVASC) 5 MG tablet Take 5  mg by mouth in the morning. 03/17/22   [provider]  apixaban (ELIQUIS) 5 MG TABS tablet Take 1 tablet (5 mg total) by mouth 2 (two) times daily. 11/01/21   Clapacs, Jackquline Denmark, MD  diphenhydrAMINE (BENADRYL) 25 mg capsule Take 25 mg by mouth at bedtime. Along with [id number]    [provider]  divalproex (DEPAKOTE) 250 MG DR tablet Take 3 tablets (750 mg total) by mouth at bedtime. 09/09/22 07/08/23  Earney Navy, NP  divalproex (DEPAKOTE) 500 MG DR tablet Take 1 tablet (500 mg total) by mouth daily at 12 noon. Patient not taking: Reported on 07/08/2023 09/09/22 10/09/22   Earney Navy, NP  docusate sodium (COLACE) 100 MG capsule Take 1 capsule (100 mg total) by mouth 2 (two) times daily. 11/01/21   Clapacs, Jackquline Denmark, MD  DULCOLAX 5 MG EC tablet Take 15 mg by mouth at bedtime.    [provider]  hydrOXYzine (ATARAX) 25 MG tablet Take 25 mg by mouth 3 (three) times daily.    [provider]  loratadine (CLARITIN) 10 MG tablet Take 1 tablet (10 mg total) by mouth daily as needed for allergies. 11/01/21   Clapacs, Jackquline Denmark, MD  losartan (COZAAR) 50 MG tablet Take 1 tablet (50 mg total) by mouth 2 (two) times daily. Patient not taking: Reported on 07/08/2023 11/01/21   Clapacs, Jackquline Denmark, MD  LUMIGAN 0.01 % SOLN Place 1 drop into both eyes at bedtime. 11/01/21   Clapacs, Jackquline Denmark, MD  metFORMIN (GLUCOPHAGE-XR) 750 MG 24 hr tablet Take 750 mg by mouth in the morning and at bedtime. 11/27/22   [provider]  Multiple Vitamins-Minerals (MULTIVITAMIN WITH MINERALS) tablet Take 1 tablet by mouth daily.    [provider]  NICORETTE 2 MG gum Take 2 mg by mouth as needed for smoking cessation.    [provider]  ocrelizumab (OCREVUS) 300 MG/10ML injection Inject 2 vials ( 20 MLs) into the vein every 6 months 04/01/23   Sater, Pearletha Furl, MD  OLANZapine (ZYPREXA) 15 MG tablet Take 15 mg by mouth at bedtime.    [provider]  paliperidone (INVEGA SUSTENNA) 156 MG/ML SUSY injection Inject 1 mL (156 mg total) into the muscle once for 1 dose. 09/12/22 07/08/23  Earney Navy, NP  paliperidone (INVEGA) 9 MG 24 hr tablet Take 9 mg by mouth daily.    [provider]  QUVIVIQ 50 MG TABS Take 1 tablet by mouth daily. 01/02/23   [provider]  SODIUM FLUORIDE 5000 PPM 1.1 % CREA dental cream Place 1 Application onto teeth in the morning and at bedtime. 05/08/23   [provider]  gabapentin (NEURONTIN) 400 MG capsule Take 400 mg by mouth 3 (three) times daily. 12/26/20 01/14/21  [provider]   propranolol (INDERAL) 10 MG tablet Take 1 tablet (10 mg total) by mouth 2 (two) times daily. 05/03/15 01/03/20  Charm Rings, NP    Family History Family History  Problem Relation Age of Onset   Diabetes Mother    ADD / ADHD Brother     Social History Social History   Tobacco Use   Smoking status: Former    Current packs/day: 0.25    Types: Cigarettes   Smokeless tobacco: Never   Tobacco comments:    2 cigarettes a day  Vaping Use   Vaping status: Never Used  Substance Use Topics   Alcohol use: Not Currently    Alcohol/week: 0.0  standard drinks of alcohol    Comment: "A little bit"    Drug use: Not Currently    Types: Marijuana    Comment: Last used: unknown      Allergies   Divalproex sodium   Review of Systems Review of Systems  Eyes:  Positive for pain and redness. Negative for photophobia, discharge and visual disturbance.  Gastrointestinal:  Negative for nausea and vomiting.  Neurological:  Positive for headaches (he reports mild headache since injury). Negative for dizziness, syncope and light-headedness.     Physical Exam Triage Vital Signs ED Triage Vitals  Encounter Vitals Group     BP 08/30/23 1340 132/85     Systolic BP Percentile --      Diastolic BP Percentile --      Pulse Rate 08/30/23 1340 71     Resp 08/30/23 1340 18     Temp 08/30/23 1340 98.3 F (36.8 C)     Temp Source 08/30/23 1340 Oral     SpO2 08/30/23 1340 98 %     Weight --      Height --      Head Circumference --      Peak Flow --      Pain Score 08/30/23 1341 0     Pain Loc --      Pain Education --      Exclude from Growth Chart --    No data found.  Updated Vital Signs BP 132/85 (BP Location: Right Arm)   Pulse 71   Temp 98.3 F (36.8 C) (Oral)   Resp 18   SpO2 98%   Visual Acuity Right Eye Distance:   Left Eye Distance:   Bilateral Distance:    Right Eye Near:   Left Eye Near:    Bilateral Near:     Physical Exam Vitals reviewed.  Constitutional:       General: He is awake.     Appearance: Normal appearance. He is well-developed and well-groomed.  HENT:     Head: Normocephalic. Contusion and right periorbital erythema present.  Eyes:     General: Gaze aligned appropriately.        Right eye: No foreign body, discharge or hordeolum.     Extraocular Movements: Extraocular movements intact.     Right eye: Normal extraocular motion and no nystagmus.     Left eye: Normal extraocular motion and no nystagmus.     Conjunctiva/sclera:     Right eye: Right conjunctiva is injected. Hemorrhage present.     Pupils: Pupils are equal, round, and reactive to light.     Comments: Right eye is notable for subconjunctival hemorrhage,  EOM appears to be intact without nystagmus but he does report some discomfort with EOM assessment  No visualized foreign bodies on inspection   There is bruising to the superior and inferior aspects of the right ocular orbit and area is very tender even to light palpation. He is able to open and close right eye without notable difficulty   Pulmonary:     Effort: Pulmonary effort is normal.  Musculoskeletal:     Cervical back: Normal range of motion and neck supple.  Neurological:     Mental Status: He is alert.  Psychiatric:        Attention and Perception: Attention normal.        Mood and Affect: Mood and affect normal.        Speech: Speech is slurred.  Behavior: Behavior normal. Behavior is cooperative.      UC Treatments / Results  Labs (all labs ordered are listed, but only abnormal results are displayed) Labs Reviewed - No data to display  EKG   Radiology DG Facial Bones Complete Result Date: 08/30/2023 CLINICAL DATA:  Right ear fullness for 1 week, decreased hearing EXAM: FACIAL BONES COMPLETE 3+V COMPARISON:  06/24/2022 FINDINGS: Frontal, Towne's, Waters, and lateral views of the facial bones are obtained. There are no acute or destructive bony abnormalities. Nasal septum is midline.  Paranasal sinuses are clear. Mastoid air cells are normally aerated. IMPRESSION: 1. Unremarkable facial bones. Electronically Signed   By: Sharlet Salina M.D.   On: 08/30/2023 17:20    Procedures Procedures (including critical care time)  Medications Ordered in UC Medications - No data to display  Initial Impression / Assessment and Plan / UC Course  I have reviewed the triage vital signs and the nursing notes.  Pertinent labs & imaging results that were available during my care of the patient were reviewed by me and considered in my medical decision making (see chart for details).      Final Clinical Impressions(s) / UC Diagnoses   Final diagnoses:  Ocular contusion, right, initial encounter  Ocular pain, right   Patient presents today with his grandmother as they are concerned for right eye swelling and redness.  Physical exam is notable for significant subconjunctival hemorrhage as well as bruising and swelling to superior and inferior aspects of the ocular orbit.  No signs of hyphema or extraocular muscle entrapment at this time.  EOM is intact and patient is PERRLA bilaterally.  He does report some mild tenderness with EOM movements.  Facial bone x-ray was unremarkable.  Less concern for potential facial fracture.  Reviewed that he likely has bruising in the area will be tender for some time.  Reviewed that he can use cool compresses to the area for about 10 to 15 minutes but needs to have a break from the compress for at least 30 minutes at a time.  Reviewed that he can take Tylenol as needed for pain management and to avoid NSAIDs as he is on an anticoagulant.  Reviewed that for eye irritation he can use lubricating eyedrop such as blink tears or refresh eyedrops and that the redness in his eye will likely start to resolve with routine healing.  ED and return precautions were reviewed and provided in after visit summary.  Follow-up as needed for progressing or persistent  symptoms    Discharge Instructions      Your x-rays were negative for signs of a fracture or broken bones around your eye.  At this time I suspect he likely just have a lot of bruising and tenderness.  You can use Tylenol as needed for pain management.  I do not recommend taking ibuprofen as you are on an anticoagulant (Eliquis).  You can use a cool compress to the area to help with swelling.  Please do not apply ice directly to the skin as this can cause irritation and frostbite.  You can apply a compress for about 10 minutes but then you need to keep it off for at least 30. To help with eye irritation you can use lubricating eyedrops such as blink tears or refresh drops as desired.  The redness in your eye will likely start to resolve with time.  If you start to have vision changes, trouble moving your eyes, more intense headaches, increased swelling around  the area, noticed blood in the iris or colored part of the eye, intense headaches please go to the emergency room for further evaluation and management.     ED Prescriptions   None    PDMP not reviewed this encounter.   Roselind Messier 08/30/23 1610

## 2023-09-17 ENCOUNTER — Other Ambulatory Visit (HOSPITAL_COMMUNITY): Payer: Self-pay

## 2023-09-21 ENCOUNTER — Other Ambulatory Visit (HOSPITAL_COMMUNITY): Payer: Self-pay

## 2023-09-30 ENCOUNTER — Ambulatory Visit: Admitting: Podiatry

## 2023-10-21 ENCOUNTER — Encounter: Payer: Self-pay | Admitting: Neurology

## 2023-10-21 ENCOUNTER — Ambulatory Visit (INDEPENDENT_AMBULATORY_CARE_PROVIDER_SITE_OTHER): Payer: 59 | Admitting: Neurology

## 2023-10-21 VITALS — BP 148/85 | HR 92 | Ht 74.0 in | Wt 313.0 lb

## 2023-10-21 DIAGNOSIS — R269 Unspecified abnormalities of gait and mobility: Secondary | ICD-10-CM

## 2023-10-21 DIAGNOSIS — M21372 Foot drop, left foot: Secondary | ICD-10-CM

## 2023-10-21 DIAGNOSIS — G35 Multiple sclerosis: Secondary | ICD-10-CM

## 2023-10-21 DIAGNOSIS — D84821 Immunodeficiency due to drugs: Secondary | ICD-10-CM

## 2023-10-21 DIAGNOSIS — F259 Schizoaffective disorder, unspecified: Secondary | ICD-10-CM | POA: Diagnosis not present

## 2023-10-21 DIAGNOSIS — Z79899 Other long term (current) drug therapy: Secondary | ICD-10-CM

## 2023-10-21 NOTE — Progress Notes (Signed)
 [facility]  PATIENT: Brandon [name] DOB: 14-Jan-1991  REFERRING DOCTOR OR PCP: [facility] SOURCE: Patient, notes from other providers at [facility], imaging and lab reports, MRI images personally reviewed  _________________________________   HISTORICAL  CHIEF COMPLAINT:  Chief Complaint  Patient presents with   Multiple Sclerosis    Rm Ms: pt stated that agitation and being in the sun is what ails him the most. Left foot drop difficulty getting boot pn, occasional urinary issues, glaucoma vision issues     HISTORY OF PRESENT ILLNESS:  Brandon [name] is a 33 year old man who was diagnosed with MS at age 30.  Update  10/21/2023 He is a 33 year old man with multiple sclerosis, schizophrenia, and a gait disorder.   He is on Ocrevus and tolerates it well.   IgM was low so we are spacing the dose out to every 8 mpnths  Currently, his gait is off balance but he has no recent falls. He notes mild left leg weakness, worse if he walks longer.  Mild foot drop at times.   No numbness.  He has not had difficulty controlling the bowels or bladder.  He denies difficulties with vision.  Color vision is symmetric  He sleeps poorly at night and is often up keeping the family awake.  During the day he sometimes takes short naps but never takes long naps.  He has schizoaffective disease and sees psychiatry.  He is currently on Haldol, olanzapine, Atarax, Depakote and benztropine.  He has had cognitive issues noted when he was in elementary school and was diagnosed with developmental delay.  Due to claustrophobia he has not had recent MRI.    Labs: Igm = 8 low Iga=70 sight low Igg 757  low normal   MS History: In 2012, he had difficulty with gait and had a few falls.  This led to an evaluation with MRI imaging and the diagnosis of multiple sclerosis.Marland Kitchen  He saw Dr. Anne Hahn and was placed on Tecfidera.  He tolerated Tecfidera fairly  well.  However, according to his mother (I spoke to her over the phone), he had multiple exacerbations and was using a wheelchair several times due to difficulties with his gait.  He received IV steroids on his 1 occasion.  He was switched to Tysabri around 2016 and then to Ocrevus around 2018 to allow for fewer infusions.  The mother feels that since he has been on Ocrevus he has not had any more exacerbations.  Due to low IgM, the dosing interval was increased to 8 months from 6 months.      REVIEW OF SYSTEMS: Constitutional: No fevers, chills, sweats, or change in appetite Eyes: No visual changes, double vision, eye pain Ear, nose and throat: No hearing loss, ear pain, nasal congestion, sore throat Cardiovascular: No chest pain, palpitations Respiratory:  No shortness of breath at rest or with exertion.   No wheezes GastrointestinaI: No nausea, vomiting, diarrhea, abdominal pain, fecal incontinence Genitourinary:  No dysuria, urinary retention or frequency.  No nocturia. Musculoskeletal:  No neck pain, back pain Integumentary: No rash, pruritus, skin lesions Neurological: as above Psychiatric: No depression at this time.  No anxiety Endocrine: No palpitations, diaphoresis, change in appetite, change in weigh or increased thirst Hematologic/Lymphatic:  No anemia, purpura, petechiae. Allergic/Immunologic: No itchy/runny eyes, nasal congestion, recent allergic reactions, rashes  ALLERGIES: Allergies  Allergen Reactions   Divalproex Sodium Other (See Comments)    Causes excessive salivation/unintentional drooling  HOME MEDICATIONS:  Current Outpatient Medications:    acetaminophen (TYLENOL) 500 MG tablet, Take 1,000 mg by mouth 3 (three) times daily as needed for moderate pain., Disp: , Rfl:    amLODipine (NORVASC) 5 MG tablet, Take 5 mg by mouth in the morning., Disp: , Rfl:    apixaban (ELIQUIS) 5 MG TABS tablet, Take 1 tablet (5 mg total) by mouth 2 (two) times daily., Disp: 60  tablet, Rfl: 0   diphenhydrAMINE (BENADRYL) 25 mg capsule, Take 25 mg by mouth at bedtime. Along with Quviviq, Disp: , Rfl:    divalproex (DEPAKOTE) 250 MG DR tablet, Take 3 tablets (750 mg total) by mouth at bedtime., Disp: 90 tablet, Rfl: 0   divalproex (DEPAKOTE) 500 MG DR tablet, Take 1 tablet (500 mg total) by mouth daily at 12 noon., Disp: 30 tablet, Rfl: 0   docusate sodium (COLACE) 100 MG capsule, Take 1 capsule (100 mg total) by mouth 2 (two) times daily., Disp: 60 capsule, Rfl: 0   DULCOLAX 5 MG EC tablet, Take 15 mg by mouth at bedtime., Disp: , Rfl:    hydrOXYzine (ATARAX) 25 MG tablet, Take 25 mg by mouth 3 (three) times daily., Disp: , Rfl:    loratadine (CLARITIN) 10 MG tablet, Take 1 tablet (10 mg total) by mouth daily as needed for allergies., Disp: 30 tablet, Rfl: 0   losartan (COZAAR) 50 MG tablet, Take 1 tablet (50 mg total) by mouth 2 (two) times daily., Disp: 60 tablet, Rfl: 0   LUMIGAN 0.01 % SOLN, Place 1 drop into both eyes at bedtime., Disp: 2.5 mL, Rfl: 0   metFORMIN (GLUCOPHAGE-XR) 750 MG 24 hr tablet, Take 750 mg by mouth in the morning and at bedtime., Disp: , Rfl:    Multiple Vitamins-Minerals (MULTIVITAMIN WITH MINERALS) tablet, Take 1 tablet by mouth daily., Disp: , Rfl:    NICORETTE 2 MG gum, Take 2 mg by mouth as needed for smoking cessation., Disp: , Rfl:    ocrelizumab (OCREVUS) 300 MG/10ML injection, Inject 2 vials ( 20 MLs) into the vein every 6 months, Disp: 20 mL, Rfl: 0   QUVIVIQ 50 MG TABS, Take 1 tablet by mouth daily., Disp: , Rfl:    SODIUM FLUORIDE 5000 PPM 1.1 % CREA dental cream, Place 1 Application onto teeth in the morning and at bedtime., Disp: , Rfl:    OLANZapine (ZYPREXA) 15 MG tablet, Take 15 mg by mouth at bedtime. (Patient not taking: Reported on 10/21/2023), Disp: , Rfl:    paliperidone (INVEGA) 9 MG 24 hr tablet, Take 9 mg by mouth daily. (Patient not taking: Reported on 10/21/2023), Disp: , Rfl:   PAST MEDICAL HISTORY: Past Medical  History:  Diagnosis Date   ADHD (attention deficit hyperactivity disorder)    Bipolar 1 disorder (HCC)    Chronic back pain    Chronic constipation    Chronic neck pain    Hypertension    Multiple sclerosis (HCC) 05/20/2013   left sided weakness, dysarthria   Non-compliance    Obesity    Pulmonary embolism (HCC)    Schizophrenia (HCC)    Stroke (HCC)    left sided deficits - pt's mother denies this    PAST SURGICAL HISTORY: Past Surgical History:  Procedure Laterality Date   NO PAST SURGERIES     None     RADIOLOGY WITH ANESTHESIA N/A 01/16/2021   Procedure: MRI WITH ANESTHESIA CERVICAL AND THORASIC SPINE WITH AND WITHOUT CONTRAST;  Surgeon: Radiologist, Medication, MD;  Location:  [facility] OR;  Service: Radiology;  Laterality: N/A;   TOOTH EXTRACTION N/A 06/24/2019   Procedure: DENTAL RESTORATION/EXTRACTION OF TEETH NUMBER ONE, SIXTEEN, SEVENTEEN, NINETEEN, THIRTY-TWO;  Surgeon: Ascencion Lava, DDS;  Location: [facility] OR;  Service: Oral Surgery;  Laterality: N/A;    FAMILY HISTORY: Family History  Problem Relation Age of Onset   Diabetes Mother    ADD / ADHD Brother     SOCIAL HISTORY: Social History   Socioeconomic History   Marital status: Single    Spouse name: Not on file   Number of children: 0   Years of education: 11th   Highest education level: Not on file  Occupational History   Occupation: unemployed    Employer: [employer]    Comment: Disbaled  Tobacco Use   Smoking status: Former    Current packs/day: 0.25    Types: Cigarettes   Smokeless tobacco: Never   Tobacco comments:    2 cigarettes a day  Vaping Use   Vaping status: Never Used  Substance and Sexual Activity   Alcohol use: Not Currently    Alcohol/week: 0.0 standard drinks of alcohol    Comment: "A little bit"    Drug use: Not Currently    Types: Marijuana    Comment: Last used: unknown    Sexual activity: Not on file  Other Topics Concern   Not on file  Social History Narrative   Patient  lives at home with his mother.   Disabled.   Education 11 th grade .   Right handed.   Drinks caffeine occassionally   Social Drivers of Corporate investment banker Strain: Low Risk  (05/27/2023)   Received from [facility]   Overall Financial Resource Strain (CARDIA)    Difficulty of Paying Living Expenses: Not hard at all  Food Insecurity: No Food Insecurity (05/27/2023)   Received from [facility]   Hunger Vital Sign    Worried About Running Out of Food in the Last Year: Never true    Ran Out of Food in the Last Year: Never true  Transportation Needs: No Transportation Needs (05/27/2023)   Received from [facility] - Transportation    Lack of Transportation (Medical): No    Lack of Transportation (Non-Medical): No  Physical Activity: Insufficiently Active (05/27/2023)   [facility]   Exercise Vital Sign    Days of Exercise per Week: 3 days    Minutes of Exercise per Session: 10 min  Stress: Stress Concern Present (05/27/2023)   Received from [facility] - Occupational Stress Questionnaire    Feeling of Stress : Very much  Social Connections: Socially Integrated (05/27/2023)   Received from [facility]   Social Network    How would you rate your social network (family, work, friends)?: Good participation with social networks  Intimate Partner Violence: Not At Risk (05/27/2023)   Received from [facility]   HITS    Over the last 12 months how often did your partner physically hurt you?: Never    Over the last 12 months how often did your partner insult you or talk down to you?: Never    Over the last 12 months how often did your partner threaten you with physical harm?: Never    Over the last 12 months how often did your partner scream or curse at you?: Never       PHYSICAL EXAM  Vitals:   10/21/23  1557 10/21/23 1559  BP: (!) 172/96 (!) 148/85  Pulse: 92   Weight: (!) 313 lb (142 kg)    Height: 6\' 2"  (1.88 m)     Body mass index is 40.19 kg/m.   General: The patient is well-developed and well-nourished and in no acute distress  HEENT:  Head is [place]/AT.  Sclera are anicteric.    Skin: Extremities are without rash or  edema.  Neurologic Exam  Mental status: The patient is alert   .Reduced focus/attention.   Speech is normal.  Cranial nerves: Extraocular movements are full.  There is good facial sensation to soft touch bilaterally.Facial strength is normal.  Trapezius and sternocleidomastoid strength is normal. No dysarthria is noted.  The tongue is midline, and the patient has symmetric elevation of the soft palate. No obvious hearing deficits are noted.  Motor:  Muscle bulk is normal.   Tone is normal. Strength is  5 / 5 in all 4 extremities.  Except 4+/5 left toe extensors  Sensory: Sensory testing is intact to pinprick, soft touch and vibration sensation in all 4 extremities.  Coordination: Cerebellar testing reveals good finger-nose-finger and heel-to-shin bilaterally.  Gait and station: Station is normal.   Gait shows slight left foot drop. Tandem gait is wide. Romberg is negative.   Reflexes: Deep tendon reflexes are symmetric and normal bilaterally.   Plantar responses are flexor.    DIAGNOSTIC DATA (LABS, IMAGING, TESTING) - I reviewed patient records, labs, notes, testing and imaging myself where available.  Lab Results  Component Value Date   WBC 7.2 07/07/2023   HGB 13.7 07/07/2023   HCT 42.5 07/07/2023   MCV 82.4 07/07/2023   PLT 193 07/07/2023      Component Value Date/Time   NA 135 07/07/2023 1800   NA 144 07/11/2021 1014   K 3.6 07/07/2023 1800   CL 104 07/07/2023 1800   CO2 23 07/07/2023 1800   GLUCOSE 116 (H) 07/07/2023 1800   BUN 13 07/07/2023 1800   BUN 12 07/11/2021 1014   CREATININE 0.96 07/07/2023 1800   CALCIUM 8.8 (L) 07/07/2023 1800   PROT 7.1 07/07/2023 1800   PROT 5.3 (L) 07/11/2021 1014   ALBUMIN 4.1 07/07/2023 1800    ALBUMIN 4.0 (L) 07/11/2021 1014   AST 15 07/07/2023 1800   ALT 29 07/07/2023 1800   ALKPHOS 77 07/07/2023 1800   BILITOT 0.4 07/07/2023 1800   BILITOT 0.2 07/11/2021 1014   GFRNONAA >60 07/07/2023 1800   GFRAA >60 04/04/2020 2158   Lab Results  Component Value Date   CHOL 152 10/29/2021   HDL 36 (L) 10/29/2021   LDLCALC 77 10/29/2021   TRIG 196 (H) 10/29/2021   CHOLHDL 4.2 10/29/2021   Lab Results  Component Value Date   HGBA1C 5.6 10/29/2021   Lab Results  Component Value Date   VITAMINB12 738 01/15/2021   Lab Results  Component Value Date   TSH 1.876 08/12/2021       ASSESSMENT AND PLAN  Multiple sclerosis (HCC)  High risk medication use  Schizoaffective disorder, unspecified type (HCC)  Left foot drop  Abnormality of gait  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  I will check some lab work today.  Due to low IgM, I am spacing the Ocrevus infusions out to every 8 months.  We could consider reducing the dose if levels drop much further. Stay active and exercise as tolerated.  Try to lose weight. Return in 6 months or sooner if  there are new or worsening neurologic symptoms.    This visit is part of a comprehensive longitudinal care medical relationship regarding the patients primary diagnosis of MS and related concerns.   Taeler Winning A. Godwin Lat, MD, [facility] 10/21/2023, 4:19 PM Certified in Neurology, Clinical Neurophysiology, Sleep Medicine and Neuroimaging  [facility] [address] [place], [place] [address] [phone]

## 2023-10-23 LAB — CD20 B CELLS
% CD19-B Cells: 0 % — ABNORMAL LOW (ref 4.6–22.1)
% CD20-B Cells: 0 % — ABNORMAL LOW (ref 5.0–22.3)

## 2023-10-23 LAB — IGG, IGA, IGM
IgA/Immunoglobulin A, Serum: 67 mg/dL — ABNORMAL LOW (ref 90–386)
IgG (Immunoglobin G), Serum: 803 mg/dL (ref 603–1613)
IgM (Immunoglobulin M), Srm: 8 mg/dL — ABNORMAL LOW (ref 20–172)

## 2023-10-24 ENCOUNTER — Encounter: Payer: Self-pay | Admitting: Neurology

## 2023-10-28 ENCOUNTER — Ambulatory Visit (INDEPENDENT_AMBULATORY_CARE_PROVIDER_SITE_OTHER): Admitting: Podiatry

## 2023-10-28 DIAGNOSIS — Z91199 Patient's noncompliance with other medical treatment and regimen due to unspecified reason: Secondary | ICD-10-CM

## 2023-10-28 NOTE — Progress Notes (Signed)
 No show

## 2023-12-14 ENCOUNTER — Other Ambulatory Visit: Payer: Self-pay

## 2023-12-17 ENCOUNTER — Other Ambulatory Visit: Payer: Self-pay | Admitting: Neurology

## 2023-12-17 ENCOUNTER — Other Ambulatory Visit: Payer: Self-pay

## 2023-12-17 ENCOUNTER — Other Ambulatory Visit (HOSPITAL_COMMUNITY): Payer: Self-pay

## 2023-12-17 ENCOUNTER — Other Ambulatory Visit: Payer: Self-pay | Admitting: Pharmacy Technician

## 2023-12-17 DIAGNOSIS — G35 Multiple sclerosis: Secondary | ICD-10-CM

## 2023-12-17 MED ORDER — OCREVUS 300 MG/10ML IV SOLN
INTRAVENOUS | 0 refills | Status: AC
Start: 2023-12-17 — End: ?
  Filled 2023-12-17: qty 20, 180d supply, fill #0

## 2023-12-17 NOTE — Progress Notes (Signed)
 Specialty Pharmacy Refill Coordination Note  Brandon [name] is a 33 y.o. male contacted today regarding refills of specialty medication(s) Ocrelizumab  (Ocrevus )  Spoke with Mom  Patient requested Courier to Provider Office   Delivery date: 12/22/23   Verified address: [facility]  [address]   Medication will be filled on 12/21/23.  Appt 12/24/23 at 10am  This fill date is pending response to refill request from provider. Patient is aware and if they have not received fill by intended date they must follow up with pharmacy.

## 2023-12-17 NOTE — Telephone Encounter (Signed)
 Placed Order on 12/17/2023

## 2023-12-18 ENCOUNTER — Other Ambulatory Visit (HOSPITAL_COMMUNITY): Payer: Self-pay

## 2023-12-18 ENCOUNTER — Other Ambulatory Visit: Payer: Self-pay

## 2023-12-21 ENCOUNTER — Other Ambulatory Visit: Payer: Self-pay

## 2023-12-21 ENCOUNTER — Other Ambulatory Visit (HOSPITAL_COMMUNITY): Payer: Self-pay

## 2023-12-24 ENCOUNTER — Ambulatory Visit (HOSPITAL_COMMUNITY)
Admission: RE | Admit: 2023-12-24 | Discharge: 2023-12-24 | Disposition: A | Discharge status: Home / Self Care | Payer: 59 | Source: Ambulatory Visit | Attending: Internal Medicine | Admitting: Internal Medicine

## 2023-12-24 ENCOUNTER — Telehealth: Payer: Self-pay | Admitting: Neurology

## 2023-12-24 ENCOUNTER — Emergency Department (HOSPITAL_COMMUNITY)
Admission: EM | Admit: 2023-12-24 | Discharge: 2023-12-24 | Disposition: A | Discharge status: Home / Self Care | Attending: Emergency Medicine | Admitting: Emergency Medicine

## 2023-12-24 ENCOUNTER — Encounter (HOSPITAL_COMMUNITY): Payer: Self-pay

## 2023-12-24 DIAGNOSIS — Z043 Encounter for examination and observation following other accident: Secondary | ICD-10-CM | POA: Insufficient documentation

## 2023-12-24 DIAGNOSIS — G35 Multiple sclerosis: Secondary | ICD-10-CM | POA: Diagnosis present

## 2023-12-24 DIAGNOSIS — Z7901 Long term (current) use of anticoagulants: Secondary | ICD-10-CM | POA: Diagnosis not present

## 2023-12-24 DIAGNOSIS — W19XXXA Unspecified fall, initial encounter: Secondary | ICD-10-CM

## 2023-12-24 MED ORDER — SODIUM CHLORIDE 0.9 % IV SOLN: INTRAVENOUS | Status: DC | PRN

## 2023-12-24 MED ORDER — ACETAMINOPHEN 325 MG PO TABS
650.0000 mg | ORAL_TABLET | Freq: Once | ORAL | Status: AC
  Administered 2023-12-24: 650 mg via ORAL
  Filled 2023-12-24: qty 2

## 2023-12-24 MED ORDER — METHYLPREDNISOLONE SODIUM SUCC 125 MG IJ SOLR
125.0000 mg | Freq: Once | INTRAMUSCULAR | Status: AC
  Administered 2023-12-24: 125 mg via INTRAVENOUS
  Filled 2023-12-24: qty 2

## 2023-12-24 MED ORDER — DIPHENHYDRAMINE HCL 50 MG/ML IJ SOLN
50.0000 mg | Freq: Once | INTRAMUSCULAR | Status: AC
  Administered 2023-12-24: 50 mg via INTRAVENOUS
  Filled 2023-12-24: qty 1

## 2023-12-24 MED ORDER — SODIUM CHLORIDE 0.9 % IV SOLN
600.0000 mg | Freq: Once | INTRAVENOUS | Status: AC
  Administered 2023-12-24: 600 mg via INTRAVENOUS
  Filled 2023-12-24: qty 20

## 2023-12-24 MED ORDER — FAMOTIDINE IN NACL 20-0.9 MG/50ML-% IV SOLN
20.0000 mg | Freq: Once | INTRAVENOUS | Status: AC
  Administered 2023-12-24: 20 mg via INTRAVENOUS
  Filled 2023-12-24: qty 50

## 2023-12-24 NOTE — ED Notes (Signed)
 Per EDP, Pt has been medically cleared.  Infusion Ctr notified and asked if Pt can return for infusion.  Infusion Ctr staff reports they must contact his Neurologist.

## 2023-12-24 NOTE — ED Notes (Signed)
 Pt verbalized understanding of discharge instructions. Opportunity for questions provided. Pt going to infusion center.

## 2023-12-24 NOTE — ED Provider Triage Note (Cosign Needed)
 Emergency Medicine Provider Triage Evaluation Note  Brandon [name] , a 33 y.o. male  was evaluated in triage.  Pt complains of fall, MS relapse, was here for infusion. Does not use a walker or cane. Left leg weakness that he states is his normal due to needing his infusion.   Review of Systems  Positive:  Negative:   Physical Exam  There were no vitals taken for this visit. Gen:   Awake, no distress   Resp:  Normal effort  MSK:   Left leg weakness.  Other:  Speech slurred, mom states this is his normal, he does not note any different   Medical Decision Making  Medically screening exam initiated at 10:31 AM.  Appropriate orders placed.  JOBIN MONTELONGO was informed that the remainder of the evaluation will be completed by another provider, this initial triage assessment does not replace that evaluation, and the importance of remaining in the ED until their evaluation is complete.     Darlis Eisenmenger, PA-C 12/24/23 1036

## 2023-12-24 NOTE — Progress Notes (Signed)
 PATIENT CARE CENTER NOTE     Diagnosis:  Multiple sclerosis (HCC) (G35)     Provider: Hortensia Ma, MD     Procedure: Ocrevus  600 mg IV      Note: Upon arrival, patient fell when getting out of his vehicle. Patient brought to the [facility] primary care and EMS called for patient. Patient reported left knee pain from fall. Patient also sweating and BP 113/47. EMS took patient to [facility] for evaluation. ED evaluated patient and found patient stable. Notified Dr. Thom Fleeting RN, Odie Benne of patient's fall. Per Dr. Godwin Lat, alright to proceed with infusion. Patient received Ocrevus  600 mg infusion via PIV.  Pre-medications (Tylenol , IV Benadryl , IV Solumedrol and Pepcid  IVPB) given per order and infusion titrated per protocol. Patient tolerated infusion well.  Mother at bedside throughout infusion. Vital signs stable. AVS offered but patient refused. Patient alert, oriented and ambulatory to wheelchair at discharge. Discharged home with uncle.

## 2023-12-24 NOTE — ED Triage Notes (Signed)
 Per EMS, Pt, from Infusion Center, presents d/t a fall.  Pt has no complaints.  Sts he fell getting out of his car.  Pt was at his routine 8 month appointment.  Infusion Center would not complete infusion and called 911.

## 2023-12-24 NOTE — Telephone Encounter (Signed)
 Took call from phone room and spoke w/ Mother. Relayed Dr. Sater approved for him to get infusion today still. I relayed this to the infusion suite. She states he will be moving in the future to [place]. She will call back with more specifics once she knows more. They will work on transferring care to another neurologist there.

## 2023-12-24 NOTE — ED Provider Notes (Cosign Needed)
 [place] EMERGENCY DEPARTMENT AT [facility] Provider Note   CSN: [id number] Arrival date & time: 12/24/23  1021     Patient presents with: Brandon [name] is a 33 y.o. male.   33 y.o. male  was evaluated in triage.  Pt complains of fall, MS relapse, was here for infusion but fell getting out of the car and the infusion center called 911 and sent him to the emergency room, did not hit head, no LOC.  He denies any injuries at this time. Does not use a walker or cane. Left leg weakness that he states is his normal due to needing his infusion. States he is supposed to have a brace on his left leg but is not wearing this today.  [name] and guardian is here with him in the emergency room.       Prior to Admission medications   Medication Sig Start Date End Date Taking? Authorizing Provider  acetaminophen  (TYLENOL ) 500 MG tablet Take 1,000 mg by mouth 3 (three) times daily as needed for moderate pain.    [provider]  amLODipine  (NORVASC ) 5 MG tablet Take 5 mg by mouth in the morning. 03/17/22   [provider]  apixaban  (ELIQUIS ) 5 MG TABS tablet Take 1 tablet (5 mg total) by mouth 2 (two) times daily. 11/01/21   Clapacs, Elida Grounds, MD  diphenhydrAMINE  (BENADRYL ) 25 mg capsule Take 25 mg by mouth at bedtime. Along with [id number]    [provider]  divalproex  (DEPAKOTE ) 250 MG DR tablet Take 3 tablets (750 mg total) by mouth at bedtime. 09/09/22 10/20/24  Onuoha, Josephine C, NP  divalproex  (DEPAKOTE ) 500 MG DR tablet Take 1 tablet (500 mg total) by mouth daily at 12 noon. 09/09/22 10/20/24  Onuoha, Josephine C, NP  docusate sodium  (COLACE) 100 MG capsule Take 1 capsule (100 mg total) by mouth 2 (two) times daily. 11/01/21   Clapacs, Elida Grounds, MD  DULCOLAX 5 MG EC tablet Take 15 mg by mouth at bedtime.    [provider]  hydrOXYzine  (ATARAX ) 25 MG tablet Take 25 mg by mouth 3 (three) times daily.    [provider]  loratadine   (CLARITIN ) 10 MG tablet Take 1 tablet (10 mg total) by mouth daily as needed for allergies. 11/01/21   Clapacs, Elida Grounds, MD  losartan  (COZAAR ) 50 MG tablet Take 1 tablet (50 mg total) by mouth 2 (two) times daily. 11/01/21   Clapacs, Elida Grounds, MD  LUMIGAN  0.01 % SOLN Place 1 drop into both eyes at bedtime. 11/01/21   Clapacs, Elida Grounds, MD  metFORMIN  (GLUCOPHAGE -XR) 750 MG 24 hr tablet Take 750 mg by mouth in the morning and at bedtime. 11/27/22   [provider]  Multiple Vitamins-Minerals (MULTIVITAMIN WITH MINERALS) tablet Take 1 tablet by mouth daily.    [provider]  NICORETTE  2 MG gum Take 2 mg by mouth as needed for smoking cessation.    [provider]  ocrelizumab  (OCREVUS ) 300 MG/10ML injection Inject 2 vials ( 20 MLs) into the vein every 6 months 12/17/23   Sater, Sherida Dimmer, MD  OLANZapine  (ZYPREXA ) 15 MG tablet Take 15 mg by mouth at bedtime. Patient not taking: Reported on 10/21/2023    [provider]  paliperidone  (INVEGA ) 9 MG 24 hr tablet Take 9 mg by mouth daily. Patient not taking: Reported on 10/21/2023    [provider]  QUVIVIQ 50 MG TABS Take 1 tablet by mouth  daily. 01/02/23   [provider]  SODIUM FLUORIDE 5000 PPM 1.1 % CREA dental cream Place 1 Application onto teeth in the morning and at bedtime. 05/08/23   [provider]  gabapentin  (NEURONTIN ) 400 MG capsule Take 400 mg by mouth 3 (three) times daily. 12/26/20 01/14/21  [provider]  propranolol  (INDERAL ) 10 MG tablet Take 1 tablet (10 mg total) by mouth 2 (two) times daily. 05/03/15 01/03/20  Lissa Riding, NP    Allergies: Divalproex  sodium    Review of Systems Negative except as per HPI Updated Vital Signs BP 136/87 (BP Location: Right Arm)   Pulse 85   Temp 98.1 F (36.7 C) (Oral)   Resp 18   Ht 6' 2 (1.88 m)   Wt (!) 142 kg   SpO2 97%   BMI 40.19 kg/m   Physical Exam Vitals and nursing note reviewed.  Constitutional:      General:  He is not in acute distress.    Appearance: He is well-developed. He is not diaphoretic.  HENT:     Head: Normocephalic and atraumatic.  Pulmonary:     Effort: Pulmonary effort is normal.   Musculoskeletal:        General: No swelling, tenderness or deformity.     Right lower leg: No edema.     Left lower leg: No edema.   Skin:    General: Skin is warm and dry.   Neurological:     Mental Status: He is alert and oriented to person, place, and time.   Psychiatric:        Behavior: Behavior normal.     (all labs ordered are listed, but only abnormal results are displayed) Labs Reviewed - No data to display  EKG: None  Radiology: No results found.   Procedures   Medications Ordered in the ED - No data to display                                  Medical Decision Making  33 year old male with history of MS brought in by EMS from infusion center. Patient is here with his mother/guardian. Patient was at the infusion center for his r regularly scheduled infusion for his MS which he receives every 8 months.  Patient was not wearing the brace on his left leg and he fell.  He did not hit his head or lose consciousness.  He went to have his infusion however due to the fall, the infusion center called 911 and sent him to the emergency room.  He does not have any injuries or complaints and is requesting to go back to the infusion center for his treatment.  His mother is with him, agrees that this is his baseline status and would like him back at the infusion center.  Infusion center was contacted and patient was taken back to infusion center for treatment.     Final diagnoses:  Fall, initial encounter    ED Discharge Orders     None          Darlis Eisenmenger, PA-C 12/24/23 1255

## 2023-12-24 NOTE — ED Notes (Signed)
 Pt's mother has called someone to come and take him back to the infusion center.  EDP aware.

## 2023-12-24 NOTE — Telephone Encounter (Signed)
 Pt's mother is asking for pt to be able to go back for infusion treatment today instead of waiting until tomorrow.  Call will be handled by RN in POD 1

## 2023-12-24 NOTE — Discharge Instructions (Signed)
 Go directly to the infusion center.  Return to the ER for any concerning symptoms.

## 2023-12-25 ENCOUNTER — Other Ambulatory Visit (HOSPITAL_COMMUNITY): Payer: Self-pay

## 2024-02-12 ENCOUNTER — Encounter: Payer: Self-pay | Admitting: Podiatry

## 2024-02-12 ENCOUNTER — Ambulatory Visit: Admitting: Podiatry

## 2024-02-12 DIAGNOSIS — M79674 Pain in right toe(s): Secondary | ICD-10-CM | POA: Diagnosis not present

## 2024-02-12 DIAGNOSIS — M79675 Pain in left toe(s): Secondary | ICD-10-CM | POA: Diagnosis not present

## 2024-02-12 DIAGNOSIS — B351 Tinea unguium: Secondary | ICD-10-CM | POA: Diagnosis not present

## 2024-02-12 DIAGNOSIS — G35 Multiple sclerosis: Secondary | ICD-10-CM

## 2024-02-12 NOTE — Progress Notes (Signed)
 This patient returns to my office for at risk foot care.  This patient requires this care by a professional since this patient will be at risk due to having MS and diabetes.  This patient is unable to cut nails himself since the patient cannot reach his nails.These nails are painful walking and wearing shoes.  This patient presents for at risk foot care today.  General Appearance  Alert, conversant and in no acute stress.  Vascular  Dorsalis pedis and posterior tibial  pulses are palpable  bilaterally.  Capillary return is within normal limits  bilaterally. Temperature is within normal limits  bilaterally.  Neurologic  Senn-Weinstein monofilament wire test within normal limits  bilaterally. Muscle power within normal limits bilaterally.  Nails Thick disfigured discolored nails with subungual debris  from hallux to fifth toes bilaterally. No evidence of bacterial infection or drainage bilaterally.  Orthopedic  No limitations of motion  feet .  No crepitus or effusions noted.  No bony pathology or digital deformities noted.  Skin  normotropic skin with no porokeratosis noted bilaterally.  No signs of infections or ulcers noted.     Onychomycosis  Pain in right toes  Pain in left toes  Consent was obtained for treatment procedures.   Mechanical debridement of nails 1-5  bilaterally performed with a nail nipper.  Filed with dremel without incident.    Return office visit      3 months                Told patient to return for periodic foot care and evaluation due to potential at risk complications.   Cordella Bold DPM

## 2024-03-16 ENCOUNTER — Telehealth: Payer: Self-pay | Admitting: Neurology

## 2024-03-16 NOTE — Telephone Encounter (Signed)
 8 mo f/u scheduled w/ wait list

## 2024-04-28 ENCOUNTER — Other Ambulatory Visit: Payer: Self-pay | Admitting: Neurology

## 2024-04-28 ENCOUNTER — Telehealth: Payer: Self-pay | Admitting: Neurology

## 2024-04-28 DIAGNOSIS — G35D Multiple sclerosis, unspecified: Secondary | ICD-10-CM

## 2024-04-28 NOTE — Telephone Encounter (Signed)
 Pt Mother called to request to  get Pt  a referral for [facility] to Be placed for Pt . Pt mother would like to transfer Pt care  to [facility]   [facility]  Phone: [phone] Fax: [phone]

## 2024-04-28 NOTE — Telephone Encounter (Signed)
Dr. Felecia Shelling- ok to place referral?

## 2024-04-29 ENCOUNTER — Telehealth: Payer: Self-pay | Admitting: Neurology

## 2024-04-29 NOTE — Telephone Encounter (Signed)
 Referral to Neurology faxed to [facility]  [facility] Phone: [phone] Fax [phone]

## 2024-06-01 ENCOUNTER — Other Ambulatory Visit: Payer: Self-pay

## 2024-06-01 NOTE — Progress Notes (Signed)
 Per care everywhere, pt had initial consult with [facility] on 05/18/24. They will be taking over his care and Ocrevus  will be administered at [facility] in February. Disenrolled.

## 2024-06-08 NOTE — Progress Notes (Signed)
 Attestation Statement:   I personally saw and evaluated the patient, and participated in the management and treatment plan as documented in the resident/fellow note.  CORDELLA TORIBIO DARING, MD

## 2024-06-17 ENCOUNTER — Other Ambulatory Visit (HOSPITAL_COMMUNITY): Payer: Self-pay

## 2024-10-19 ENCOUNTER — Ambulatory Visit: Admitting: Neurology
